# Patient Record
Sex: Male | Born: 1960 | Hispanic: No | Smoking: Former smoker
Health system: Southern US, Community
[De-identification: ages and names within clinical notes are randomized; demographics above are authoritative.]

## PROBLEM LIST (undated history)

## (undated) DIAGNOSIS — K449 Diaphragmatic hernia without obstruction or gangrene: Secondary | ICD-10-CM

## (undated) DIAGNOSIS — M898X5 Other specified disorders of bone, thigh: Secondary | ICD-10-CM

## (undated) DIAGNOSIS — A879 Viral meningitis, unspecified: Secondary | ICD-10-CM

## (undated) DIAGNOSIS — J449 Chronic obstructive pulmonary disease, unspecified: Secondary | ICD-10-CM

## (undated) DIAGNOSIS — K648 Other hemorrhoids: Secondary | ICD-10-CM

## (undated) DIAGNOSIS — R0600 Dyspnea, unspecified: Secondary | ICD-10-CM

## (undated) DIAGNOSIS — H409 Unspecified glaucoma: Secondary | ICD-10-CM

## (undated) DIAGNOSIS — Z8679 Personal history of other diseases of the circulatory system: Secondary | ICD-10-CM

## (undated) DIAGNOSIS — Z8711 Personal history of peptic ulcer disease: Secondary | ICD-10-CM

## (undated) DIAGNOSIS — I119 Hypertensive heart disease without heart failure: Secondary | ICD-10-CM

## (undated) DIAGNOSIS — T7840XA Allergy, unspecified, initial encounter: Secondary | ICD-10-CM

## (undated) DIAGNOSIS — R590 Localized enlarged lymph nodes: Secondary | ICD-10-CM

## (undated) DIAGNOSIS — L739 Follicular disorder, unspecified: Secondary | ICD-10-CM

## (undated) DIAGNOSIS — I10 Essential (primary) hypertension: Secondary | ICD-10-CM

## (undated) DIAGNOSIS — F0781 Postconcussional syndrome: Secondary | ICD-10-CM

## (undated) DIAGNOSIS — R51 Headache: Secondary | ICD-10-CM

## (undated) DIAGNOSIS — M1990 Unspecified osteoarthritis, unspecified site: Secondary | ICD-10-CM

## (undated) DIAGNOSIS — K219 Gastro-esophageal reflux disease without esophagitis: Secondary | ICD-10-CM

## (undated) DIAGNOSIS — D649 Anemia, unspecified: Secondary | ICD-10-CM

## (undated) DIAGNOSIS — R972 Elevated prostate specific antigen [PSA]: Secondary | ICD-10-CM

## (undated) DIAGNOSIS — J40 Bronchitis, not specified as acute or chronic: Secondary | ICD-10-CM

## (undated) DIAGNOSIS — I43 Cardiomyopathy in diseases classified elsewhere: Secondary | ICD-10-CM

## (undated) DIAGNOSIS — J3089 Other allergic rhinitis: Secondary | ICD-10-CM

## (undated) DIAGNOSIS — IMO0002 Reserved for concepts with insufficient information to code with codable children: Secondary | ICD-10-CM

## (undated) DIAGNOSIS — B20 Human immunodeficiency virus [HIV] disease: Secondary | ICD-10-CM

## (undated) DIAGNOSIS — Z87442 Personal history of urinary calculi: Secondary | ICD-10-CM

## (undated) DIAGNOSIS — Z973 Presence of spectacles and contact lenses: Secondary | ICD-10-CM

## (undated) DIAGNOSIS — J189 Pneumonia, unspecified organism: Secondary | ICD-10-CM

## (undated) DIAGNOSIS — Z5189 Encounter for other specified aftercare: Secondary | ICD-10-CM

## (undated) DIAGNOSIS — Z87820 Personal history of traumatic brain injury: Secondary | ICD-10-CM

## (undated) DIAGNOSIS — N189 Chronic kidney disease, unspecified: Secondary | ICD-10-CM

## (undated) DIAGNOSIS — Z8719 Personal history of other diseases of the digestive system: Secondary | ICD-10-CM

## (undated) DIAGNOSIS — J329 Chronic sinusitis, unspecified: Secondary | ICD-10-CM

## (undated) DIAGNOSIS — Z8614 Personal history of Methicillin resistant Staphylococcus aureus infection: Secondary | ICD-10-CM

## (undated) DIAGNOSIS — R310 Gross hematuria: Secondary | ICD-10-CM

## (undated) DIAGNOSIS — C61 Malignant neoplasm of prostate: Secondary | ICD-10-CM

## (undated) HISTORY — DX: Chronic sinusitis, unspecified: J32.9

## (undated) HISTORY — PX: CARDIAC CATHETERIZATION: SHX172

## (undated) HISTORY — PX: COLONOSCOPY: SHX174

## (undated) HISTORY — PX: BREAST SURGERY: SHX581

## (undated) HISTORY — DX: Allergy, unspecified, initial encounter: T78.40XA

## (undated) HISTORY — DX: Diaphragmatic hernia without obstruction or gangrene: K44.9

## (undated) HISTORY — DX: Encounter for other specified aftercare: Z51.89

## (undated) HISTORY — DX: Pneumonia, unspecified organism: J18.9

## (undated) HISTORY — PX: DENTAL EXAMINATION UNDER ANESTHESIA: SHX1447

## (undated) HISTORY — PX: UPPER GASTROINTESTINAL ENDOSCOPY: SHX188

## (undated) HISTORY — DX: Other hemorrhoids: K64.8

## (undated) HISTORY — PX: JOINT REPLACEMENT: SHX530

---

## 1983-07-04 HISTORY — PX: KNEE ARTHROSCOPY: SUR90

## 1986-07-03 DIAGNOSIS — B20 Human immunodeficiency virus [HIV] disease: Secondary | ICD-10-CM

## 1986-07-03 DIAGNOSIS — Z21 Asymptomatic human immunodeficiency virus [HIV] infection status: Secondary | ICD-10-CM

## 1986-07-03 HISTORY — DX: Asymptomatic human immunodeficiency virus (hiv) infection status: Z21

## 1986-07-03 HISTORY — DX: Human immunodeficiency virus (HIV) disease: B20

## 1997-11-27 ENCOUNTER — Emergency Department (HOSPITAL_COMMUNITY): Admission: EM | Admit: 1997-11-27 | Discharge: 1997-11-27 | Payer: Self-pay | Admitting: Emergency Medicine

## 1997-11-28 ENCOUNTER — Emergency Department (HOSPITAL_COMMUNITY): Admission: EM | Admit: 1997-11-28 | Discharge: 1997-11-28 | Payer: Self-pay | Admitting: Emergency Medicine

## 1997-11-30 ENCOUNTER — Emergency Department (HOSPITAL_COMMUNITY): Admission: EM | Admit: 1997-11-30 | Discharge: 1997-11-30 | Payer: Self-pay | Admitting: Emergency Medicine

## 1998-03-08 ENCOUNTER — Emergency Department (HOSPITAL_COMMUNITY): Admission: EM | Admit: 1998-03-08 | Discharge: 1998-03-08 | Payer: Self-pay | Admitting: Emergency Medicine

## 1998-12-06 ENCOUNTER — Emergency Department (HOSPITAL_COMMUNITY): Admission: EM | Admit: 1998-12-06 | Discharge: 1998-12-07 | Payer: Self-pay | Admitting: Emergency Medicine

## 2001-03-15 ENCOUNTER — Encounter: Payer: Self-pay | Admitting: Internal Medicine

## 2001-03-15 ENCOUNTER — Inpatient Hospital Stay (HOSPITAL_COMMUNITY): Admission: EM | Admit: 2001-03-15 | Discharge: 2001-03-22 | Payer: Self-pay | Admitting: Emergency Medicine

## 2001-03-15 ENCOUNTER — Encounter (INDEPENDENT_AMBULATORY_CARE_PROVIDER_SITE_OTHER): Payer: Self-pay | Admitting: Specialist

## 2001-03-16 ENCOUNTER — Encounter: Payer: Self-pay | Admitting: Internal Medicine

## 2001-03-17 ENCOUNTER — Encounter (INDEPENDENT_AMBULATORY_CARE_PROVIDER_SITE_OTHER): Payer: Self-pay | Admitting: *Deleted

## 2001-03-17 ENCOUNTER — Encounter: Payer: Self-pay | Admitting: Internal Medicine

## 2001-03-17 LAB — CONVERTED CEMR LAB
Creatinine, Ser: 1.3 mg/dL
Glucose, Bld: 99 mg/dL
Hemoglobin: 8.7 g/dL
Platelets: 158 10*3/uL
WBC: 5.9 10*3/uL

## 2001-03-18 ENCOUNTER — Encounter: Payer: Self-pay | Admitting: Internal Medicine

## 2001-03-18 ENCOUNTER — Encounter (INDEPENDENT_AMBULATORY_CARE_PROVIDER_SITE_OTHER): Payer: Self-pay | Admitting: *Deleted

## 2001-03-18 LAB — CONVERTED CEMR LAB
HCV Ab: NEGATIVE
Hep B S Ab: NEGATIVE
Hepatitis B Surface Ag: NEGATIVE

## 2001-03-20 ENCOUNTER — Encounter (INDEPENDENT_AMBULATORY_CARE_PROVIDER_SITE_OTHER): Payer: Self-pay | Admitting: *Deleted

## 2001-03-20 LAB — CONVERTED CEMR LAB
CD4 Count: <10 microliters
CD4 T Helper %: 2 %

## 2001-03-22 ENCOUNTER — Encounter (HOSPITAL_COMMUNITY): Admission: RE | Admit: 2001-03-22 | Discharge: 2001-06-20 | Payer: Self-pay | Admitting: Infectious Diseases

## 2001-06-26 ENCOUNTER — Emergency Department (HOSPITAL_COMMUNITY): Admission: EM | Admit: 2001-06-26 | Discharge: 2001-06-26 | Payer: Self-pay | Admitting: Emergency Medicine

## 2001-08-10 ENCOUNTER — Emergency Department (HOSPITAL_COMMUNITY): Admission: EM | Admit: 2001-08-10 | Discharge: 2001-08-10 | Payer: Self-pay | Admitting: Emergency Medicine

## 2006-11-26 ENCOUNTER — Encounter: Payer: Self-pay | Admitting: Internal Medicine

## 2008-03-24 ENCOUNTER — Emergency Department (HOSPITAL_COMMUNITY): Admission: EM | Admit: 2008-03-24 | Discharge: 2008-03-24 | Payer: Self-pay | Admitting: Family Medicine

## 2008-07-03 DIAGNOSIS — Z8614 Personal history of Methicillin resistant Staphylococcus aureus infection: Secondary | ICD-10-CM

## 2008-07-03 HISTORY — DX: Personal history of Methicillin resistant Staphylococcus aureus infection: Z86.14

## 2009-03-22 ENCOUNTER — Ambulatory Visit: Payer: Self-pay | Admitting: Internal Medicine

## 2009-03-22 ENCOUNTER — Encounter (INDEPENDENT_AMBULATORY_CARE_PROVIDER_SITE_OTHER): Payer: Self-pay | Admitting: *Deleted

## 2009-03-22 ENCOUNTER — Inpatient Hospital Stay (HOSPITAL_COMMUNITY): Admission: EM | Admit: 2009-03-22 | Discharge: 2009-03-27 | Payer: Self-pay | Admitting: Emergency Medicine

## 2009-03-22 LAB — CONVERTED CEMR LAB
CD4 Count: <10 microliters
CD4 T Helper %: 1 %
HIV 1 RNA Quant: 57300 copies/mL

## 2009-03-23 ENCOUNTER — Ambulatory Visit: Payer: Self-pay | Admitting: Cardiovascular Disease

## 2009-03-23 ENCOUNTER — Ambulatory Visit: Payer: Self-pay | Admitting: Internal Medicine

## 2009-03-24 ENCOUNTER — Encounter (INDEPENDENT_AMBULATORY_CARE_PROVIDER_SITE_OTHER): Payer: Self-pay | Admitting: General Surgery

## 2009-03-24 HISTORY — PX: OTHER SURGICAL HISTORY: SHX169

## 2009-03-25 ENCOUNTER — Encounter (INDEPENDENT_AMBULATORY_CARE_PROVIDER_SITE_OTHER): Payer: Self-pay | Admitting: *Deleted

## 2009-03-25 LAB — CONVERTED CEMR LAB
Creatinine, Ser: 1.24 mg/dL
Glucose, Bld: 93 mg/dL
Hemoglobin: 8.9 g/dL
Platelets: 160 10*3/uL
WBC: 2.2 10*3/uL

## 2009-04-02 ENCOUNTER — Encounter (INDEPENDENT_AMBULATORY_CARE_PROVIDER_SITE_OTHER): Payer: Self-pay | Admitting: *Deleted

## 2009-04-02 DIAGNOSIS — D649 Anemia, unspecified: Secondary | ICD-10-CM | POA: Insufficient documentation

## 2009-04-02 DIAGNOSIS — D72819 Decreased white blood cell count, unspecified: Secondary | ICD-10-CM | POA: Insufficient documentation

## 2009-04-19 ENCOUNTER — Encounter (INDEPENDENT_AMBULATORY_CARE_PROVIDER_SITE_OTHER): Payer: Self-pay | Admitting: *Deleted

## 2009-04-19 ENCOUNTER — Ambulatory Visit: Payer: Self-pay | Admitting: Internal Medicine

## 2009-04-19 DIAGNOSIS — K6289 Other specified diseases of anus and rectum: Secondary | ICD-10-CM | POA: Insufficient documentation

## 2009-04-19 DIAGNOSIS — B20 Human immunodeficiency virus [HIV] disease: Secondary | ICD-10-CM | POA: Insufficient documentation

## 2009-04-19 DIAGNOSIS — K029 Dental caries, unspecified: Secondary | ICD-10-CM | POA: Insufficient documentation

## 2009-05-03 ENCOUNTER — Ambulatory Visit: Payer: Self-pay | Admitting: Internal Medicine

## 2009-05-05 ENCOUNTER — Encounter (INDEPENDENT_AMBULATORY_CARE_PROVIDER_SITE_OTHER): Payer: Self-pay | Admitting: *Deleted

## 2009-06-14 ENCOUNTER — Ambulatory Visit: Payer: Self-pay | Admitting: Infectious Disease

## 2009-06-14 ENCOUNTER — Encounter: Payer: Self-pay | Admitting: Internal Medicine

## 2009-06-14 DIAGNOSIS — F0630 Mood disorder due to known physiological condition, unspecified: Secondary | ICD-10-CM | POA: Insufficient documentation

## 2009-06-14 LAB — CONVERTED CEMR LAB
ALT: 15 units/L (ref 0–53)
AST: 22 units/L (ref 0–37)
Albumin: 3.6 g/dL (ref 3.5–5.2)
Alkaline Phosphatase: 65 units/L (ref 39–117)
BUN: 9 mg/dL (ref 6–23)
Basophils Absolute: 0 10*3/uL (ref 0.0–0.1)
Basophils Relative: 0 % (ref 0–1)
CO2: 29 meq/L (ref 19–32)
Calcium: 8.8 mg/dL (ref 8.4–10.5)
Chloride: 108 meq/L (ref 96–112)
Creatinine, Ser: 1.32 mg/dL (ref 0.40–1.50)
Eosinophils Absolute: 0.8 10*3/uL — ABNORMAL HIGH (ref 0.0–0.7)
Eosinophils Relative: 31 % — ABNORMAL HIGH (ref 0–5)
Glucose, Bld: 78 mg/dL (ref 70–99)
HCT: 36 % — ABNORMAL LOW (ref 39.0–52.0)
Hemoglobin: 11.9 g/dL — ABNORMAL LOW (ref 13.0–17.0)
Lymphocytes Relative: 32 % (ref 12–46)
Lymphs Abs: 0.9 10*3/uL (ref 0.7–4.0)
MCHC: 33 g/dL (ref 30.0–36.0)
MCV: 96.1 fL (ref >=78.0)
Monocytes Absolute: 0.3 10*3/uL (ref 0.1–1.0)
Monocytes Relative: 11 % (ref 3–12)
Neutro Abs: 0.7 10*3/uL — ABNORMAL LOW (ref 1.7–7.7)
Neutrophils Relative %: 26 % — ABNORMAL LOW (ref 43–77)
Platelets: 136 10*3/uL — ABNORMAL LOW (ref 150–400)
Potassium: 4.3 meq/L (ref 3.5–5.3)
RBC: 3.75 M/uL — ABNORMAL LOW (ref 4.22–5.81)
RDW: 15.9 % — ABNORMAL HIGH (ref 11.5–15.5)
Sodium: 140 meq/L (ref 135–145)
Total Bilirubin: 0.8 mg/dL (ref 0.3–1.2)
Total Protein: 7.5 g/dL (ref 6.0–8.3)
WBC: 2.7 10*3/uL — ABNORMAL LOW (ref 4.0–10.5)

## 2009-06-18 ENCOUNTER — Encounter: Payer: Self-pay | Admitting: Infectious Disease

## 2009-06-18 LAB — CONVERTED CEMR LAB
HIV 1 RNA Quant: 123000 copies/mL — ABNORMAL HIGH (ref <48)
HIV-1 RNA Quant, Log: 5.09 — ABNORMAL HIGH (ref <1.68)

## 2009-12-21 ENCOUNTER — Ambulatory Visit: Payer: Self-pay | Admitting: Internal Medicine

## 2009-12-21 ENCOUNTER — Encounter (INDEPENDENT_AMBULATORY_CARE_PROVIDER_SITE_OTHER): Payer: Self-pay | Admitting: *Deleted

## 2009-12-21 ENCOUNTER — Telehealth (INDEPENDENT_AMBULATORY_CARE_PROVIDER_SITE_OTHER): Payer: Self-pay | Admitting: *Deleted

## 2009-12-21 LAB — CONVERTED CEMR LAB
ALT: 12 units/L (ref 0–53)
AST: 17 units/L (ref 0–37)
Albumin: 3.9 g/dL (ref 3.5–5.2)
Alkaline Phosphatase: 61 units/L (ref 39–117)
BUN: 11 mg/dL (ref 6–23)
Basophils Absolute: 0 10*3/uL (ref 0.0–0.1)
Basophils Relative: 0 % (ref 0–1)
CO2: 27 meq/L (ref 19–32)
Calcium: 9 mg/dL (ref 8.4–10.5)
Chloride: 106 meq/L (ref 96–112)
Cholesterol: 120 mg/dL (ref 0–200)
Creatinine, Ser: 1.13 mg/dL (ref 0.40–1.50)
Eosinophils Absolute: 1 10*3/uL — ABNORMAL HIGH (ref 0.0–0.7)
Eosinophils Relative: 29 % — ABNORMAL HIGH (ref 0–5)
Glucose, Bld: 79 mg/dL (ref 70–99)
HCT: 34 % — ABNORMAL LOW (ref 39.0–52.0)
HDL: 27 mg/dL — ABNORMAL LOW (ref >39)
HIV 1 RNA Quant: 156000 copies/mL — ABNORMAL HIGH (ref <48)
HIV-1 RNA Quant, Log: 5.19 — ABNORMAL HIGH (ref <1.68)
Hemoglobin: 11.2 g/dL — ABNORMAL LOW (ref 13.0–17.0)
LDL Cholesterol: 58 mg/dL (ref 0–99)
Lymphocytes Relative: 27 % (ref 12–46)
Lymphs Abs: 0.9 10*3/uL (ref 0.7–4.0)
MCHC: 32.9 g/dL (ref 30.0–36.0)
MCV: 91.4 fL (ref 78.0–100.0)
Monocytes Absolute: 0.3 10*3/uL (ref 0.1–1.0)
Monocytes Relative: 9 % (ref 3–12)
Neutro Abs: 1.2 10*3/uL — ABNORMAL LOW (ref 1.7–7.7)
Neutrophils Relative %: 35 % — ABNORMAL LOW (ref 43–77)
Platelets: 125 10*3/uL — ABNORMAL LOW (ref 150–400)
Potassium: 4.2 meq/L (ref 3.5–5.3)
RBC: 3.72 M/uL — ABNORMAL LOW (ref 4.22–5.81)
RDW: 14.2 % (ref 11.5–15.5)
Sodium: 142 meq/L (ref 135–145)
Total Bilirubin: 0.6 mg/dL (ref 0.3–1.2)
Total CHOL/HDL Ratio: 4.4
Total Protein: 7.1 g/dL (ref 6.0–8.3)
Triglycerides: 173 mg/dL — ABNORMAL HIGH (ref <150)
VLDL: 35 mg/dL (ref 0–40)
WBC: 3.4 10*3/uL — ABNORMAL LOW (ref 4.0–10.5)

## 2010-02-14 ENCOUNTER — Encounter (INDEPENDENT_AMBULATORY_CARE_PROVIDER_SITE_OTHER): Payer: Self-pay | Admitting: Emergency Medicine

## 2010-02-14 ENCOUNTER — Ambulatory Visit: Payer: Self-pay | Admitting: Surgery

## 2010-02-14 ENCOUNTER — Emergency Department (HOSPITAL_COMMUNITY): Admission: EM | Admit: 2010-02-14 | Discharge: 2010-02-14 | Payer: Self-pay | Admitting: Emergency Medicine

## 2010-07-03 HISTORY — PX: UPPER LEG SOFT TISSUE BIOPSY: SUR152

## 2010-07-12 ENCOUNTER — Telehealth: Payer: Self-pay | Admitting: Internal Medicine

## 2010-07-25 ENCOUNTER — Encounter: Payer: Self-pay | Admitting: Infectious Diseases

## 2010-07-25 ENCOUNTER — Encounter
Admission: RE | Admit: 2010-07-25 | Discharge: 2010-07-25 | Payer: Self-pay | Source: Home / Self Care | Attending: Infectious Diseases | Admitting: Infectious Diseases

## 2010-07-25 ENCOUNTER — Ambulatory Visit
Admission: RE | Admit: 2010-07-25 | Discharge: 2010-07-25 | Payer: Self-pay | Source: Home / Self Care | Attending: Infectious Diseases | Admitting: Infectious Diseases

## 2010-07-25 ENCOUNTER — Encounter: Payer: Self-pay | Admitting: Internal Medicine

## 2010-07-25 LAB — CONVERTED CEMR LAB
ALT: 53 units/L (ref 0–53)
AST: 62 units/L — ABNORMAL HIGH (ref 0–37)
Albumin: 3.6 g/dL (ref 3.5–5.2)
Alkaline Phosphatase: 76 units/L (ref 39–117)
BUN: 10 mg/dL (ref 6–23)
Basophils Absolute: 0 10*3/uL (ref 0.0–0.1)
Basophils Relative: 0 % (ref 0–1)
CO2: 26 meq/L (ref 19–32)
Calcium: 9.1 mg/dL (ref 8.4–10.5)
Chloride: 106 meq/L (ref 96–112)
Cholesterol: 138 mg/dL (ref 0–200)
Creatinine, Ser: 1.07 mg/dL (ref 0.40–1.50)
Eosinophils Absolute: 0.9 10*3/uL — ABNORMAL HIGH (ref 0.0–0.7)
Eosinophils Relative: 41 % — ABNORMAL HIGH (ref 0–5)
Glucose, Bld: 94 mg/dL (ref 70–99)
HCT: 32.9 % — ABNORMAL LOW (ref 39.0–52.0)
HDL: 31 mg/dL — ABNORMAL LOW (ref >39)
HIV 1 RNA Quant: 1480000 copies/mL — ABNORMAL HIGH (ref <20)
HIV-1 RNA Quant, Log: 6.17 — ABNORMAL HIGH (ref <1.30)
Hemoglobin: 10.6 g/dL — ABNORMAL LOW (ref 13.0–17.0)
Hep A Total Ab: NEGATIVE
LDL Cholesterol: 56 mg/dL (ref 0–99)
Lymphocytes Relative: 19 % (ref 12–46)
Lymphs Abs: 0.4 10*3/uL — ABNORMAL LOW (ref 0.7–4.0)
MCHC: 32.2 g/dL (ref 30.0–36.0)
MCV: 87 fL (ref 78.0–100.0)
Monocytes Absolute: 0.2 10*3/uL (ref 0.1–1.0)
Monocytes Relative: 8 % (ref 3–12)
Neutro Abs: 0.7 10*3/uL — ABNORMAL LOW (ref 1.7–7.7)
Neutrophils Relative %: 32 % — ABNORMAL LOW (ref 43–77)
Platelets: 180 10*3/uL (ref 150–400)
Potassium: 4.7 meq/L (ref 3.5–5.3)
RBC: 3.78 M/uL — ABNORMAL LOW (ref 4.22–5.81)
RDW: 15.8 % — ABNORMAL HIGH (ref 11.5–15.5)
Sodium: 141 meq/L (ref 135–145)
Total Bilirubin: 0.4 mg/dL (ref 0.3–1.2)
Total CHOL/HDL Ratio: 4.5
Total Protein: 7.1 g/dL (ref 6.0–8.3)
Triglycerides: 253 mg/dL — ABNORMAL HIGH (ref <150)
VLDL: 51 mg/dL — ABNORMAL HIGH (ref 0–40)
WBC: 2.3 10*3/uL — ABNORMAL LOW (ref 4.0–10.5)

## 2010-07-26 ENCOUNTER — Encounter: Payer: Self-pay | Admitting: Internal Medicine

## 2010-07-27 LAB — T-HELPER CELL (CD4) - (RCID CLINIC ONLY)
CD4 % Helper T Cell: <1 % — ABNORMAL LOW (ref 33–55)
CD4 T Cell Abs: <10 uL — ABNORMAL LOW (ref 400–2700)

## 2010-07-31 LAB — CONVERTED CEMR LAB
ALT: 11 units/L (ref 0–53)
AST: 18 units/L (ref 0–37)
Albumin: 3.9 g/dL (ref 3.5–5.2)
Alkaline Phosphatase: 70 units/L (ref 39–117)
BUN: 8 mg/dL (ref 6–23)
Basophils Absolute: 0 10*3/uL (ref 0.0–0.1)
Basophils Relative: 1 % (ref 0–1)
CO2: 24 meq/L (ref 19–32)
Calcium: 8.7 mg/dL (ref 8.4–10.5)
Chlamydia, Swab/Urine, PCR: NEGATIVE
Chloride: 106 meq/L (ref 96–112)
Cholesterol: 134 mg/dL (ref 0–200)
Creatinine, Ser: 1.15 mg/dL (ref 0.40–1.50)
Eosinophils Absolute: 0.6 10*3/uL (ref 0.0–0.7)
Eosinophils Relative: 14 % — ABNORMAL HIGH (ref 0–5)
GC Probe Amp, Urine: NEGATIVE
Glucose, Bld: 83 mg/dL (ref 70–99)
HCT: 31.8 % — ABNORMAL LOW (ref 39.0–52.0)
HCV Ab: NEGATIVE
HDL: 32 mg/dL — ABNORMAL LOW (ref >39)
Hemoglobin: 10.1 g/dL — ABNORMAL LOW (ref 13.0–17.0)
Hep B S Ab: POSITIVE — AB
Hepatitis B Surface Ag: NEGATIVE
LDL Cholesterol: 74 mg/dL (ref 0–99)
Lymphocytes Relative: 30 % (ref 12–46)
Lymphs Abs: 1.2 10*3/uL (ref 0.7–4.0)
MCHC: 31.8 g/dL (ref 30.0–36.0)
MCV: 94.1 fL (ref >=78.0)
Monocytes Absolute: 0.4 10*3/uL (ref 0.1–1.0)
Monocytes Relative: 9 % (ref 3–12)
Neutro Abs: 2 10*3/uL (ref 1.7–7.7)
Neutrophils Relative %: 47 % (ref 43–77)
Platelets: 140 10*3/uL — ABNORMAL LOW (ref 150–400)
Potassium: 4 meq/L (ref 3.5–5.3)
RBC: 3.38 M/uL — ABNORMAL LOW (ref 4.22–5.81)
RDW: 14.9 % (ref 11.5–15.5)
Sodium: 142 meq/L (ref 135–145)
Total Bilirubin: 1.4 mg/dL — ABNORMAL HIGH (ref 0.3–1.2)
Total CHOL/HDL Ratio: 4.2
Total Protein: 7.2 g/dL (ref 6.0–8.3)
Triglycerides: 139 mg/dL (ref <150)
VLDL: 28 mg/dL (ref 0–40)
WBC: 4.2 10*3/uL (ref 4.0–10.5)

## 2010-08-04 NOTE — Progress Notes (Signed)
Summary: Pt to bring in information  spoke to patient who does not have his insurance cared (pharmacy coverage) for me to assist him today.  He left his card in his truck in [place]  He said he will take what was prescribed for him today and come in to be seen in the next 3 weeks for me to assist him.  Paulo Fruit  BS,CPht II,MPH  December 21, 2009 12:42 PM

## 2010-08-04 NOTE — Miscellaneous (Signed)
Summary: pneumovax - 03/22/09  Clinical Lists Changes  Observations: Added new observation of PNEUMOVAX: Historical (03/22/2009 10:02)      Pneumovax Immunization History:    Pneumovax # 1:  Historical (03/22/2009)
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
## 2010-08-04 NOTE — Consult Note (Signed)
Summary: [facility]   Imported By: Roderic Ovens 04/13/2009 15:29:02  _____________________________________________________________________  External Attachment:    Type:   Image     Comment:   External Document
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
## 2010-08-04 NOTE — Miscellaneous (Signed)
Summary: Updated RW flowsheet  Clinical Lists Changes  Observations: Added new observation of RWPARTICIP: Yes (12/21/2009 13:53) Added new observation of PAYOR: Private (12/21/2009 13:53) Added new observation of REC_MESSAGE: Yes (12/21/2009 13:53) Added new observation of RECPHONECALL: Yes (12/21/2009 13:53) Added new observation of REC_MAIL: Yes (12/21/2009 13:53) Added new observation of INFECTDIS MD: Orvan Falconer (12/21/2009 13:53) Added new observation of GENDER: Male (12/21/2009 13:53) Added new observation of LATINO/HISP: No (12/21/2009 13:53) Added new observation of RACE: African American (12/21/2009 13:53) Added new observation of PATNTCOUNTY: [place] (12/21/2009 13:53) Added new observation of YEARAIDSPOS: 2002  (12/21/2009 13:53) Added new observation of CREATININE: 1.24 mg/dL (16/04/9603 54:09) Added new observation of PLATELETK/UL: 160 K/uL (03/25/2009 13:57) Added new observation of WBC COUNT: 2.2 10*3/microliter (03/25/2009 13:57) Added new observation of HGB: 8.9 g/dL (81/19/1478 29:56) Added new observation of GLUCOSE SER: 93 mg/dL (21/30/8657 84:69) Added new observation of HIV1RNA QA: 57300 copies/mL (03/22/2009 13:55) Added new observation of T-HELPER %: 1 % (03/22/2009 13:55) Added new observation of CD4 COUNT: <10 microliters (03/22/2009 13:55) Added new observation of T-HELPER %: 2 % (03/20/2001 14:00) Added new observation of CD4 COUNT: <10 microliters (03/20/2001 14:00) Added new observation of HEP C AB: NEG  (03/18/2001 14:01) Added new observation of ANTI-HBS: NEG  (03/18/2001 14:01) Added new observation of HBSAG: NEG  (03/18/2001 14:01) Added new observation of CREATININE: 1.3 mg/dL (62/95/2841 32:44) Added new observation of PLATELETK/UL: 158 K/uL (03/17/2001 14:02) Added new observation of WBC COUNT: 5.9 10*3/microliter (03/17/2001 14:02) Added new observation of HGB: 8.7 g/dL (07/05/7251 66:44) Added new observation of GLUCOSE SER: 99 mg/dL (03/47/4259  56:38)      -  Date:  03/25/2009    Glucose: 93  Date:  03/17/2001    Glucose: 99

## 2010-08-04 NOTE — Assessment & Plan Note (Signed)
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  History of Present Illness: Casey Brennan is in for his first visit since January.  After that visit with Dr. Daiva Eves he was able to get a brief supply of his medications and started taking Truvada, Prezista, Norvir, Zithromax, Bactrim, and Diflucan.  He said that his monthly co-pay was $900 and that he has been told and other clinics that he makes too much money to qualify for any medication assistance.  He has been out of all medications for several months.  He says that he did tolerate the medications well while he was on them.  Recently he has continued to have problems with intermittently draining left breast abscess.  He says he has a week of vacation coming up and wants to schedule a follow-up appointment with Dr. Claud Kelp who did an incision and drainage procedure on him when he was hospitalized a year and a half ago.  He is also been bothered by recurrent small boils and pruritic skin rash.  He is also hoping to have his remaining front teeth pulled.  Preventive Screening-Counseling & Management  Alcohol-Tobacco     Alcohol drinks/day: 0     Smoking Status: current     Cigars/week: 3 packs of cigars/wk  Caffeine-Diet-Exercise     Caffeine use/day: yes     Does Patient Exercise: yes     Type of exercise: weight lifting     Exercise (avg: min/session): <30     Times/week: <3  Hep-HIV-STD-Contraception     HIV Risk: no risk noted  Safety-Violence-Falls     Seat Belt Use: yes  Comments: declined condoms      Sexual History:  n/a.        Drug Use:  never.     Current Allergies (reviewed today): ! PCN ! 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 No Pain Assessment Patient in pain? no      Nutritional Status BMI of 25 - 29 = overweight Nutritional Status Detail appetite "pretty decent"  Have you ever been in a relationship where you felt threatened, hurt or afraid?not asked today   Does patient need assistance? Functional Status Self care Ambulation Normal Comments been off rxes since Feb 2011.  Pt. unable to afford the rxes.   Physical Exam  General:  alert and well-nourished.   Mouth:  poor dentition and teeth missing.  his only remaining teeth are in very poor condition. Breasts:  is left breast is indurated around the nipple and the nipple is quite enlarged with a pinhole tract that intermittently drains purulent fluid.  It is nontender and there is no cellulitis. Lungs:  normal respiratory effort, normal breath sounds, no crackles, and no wheezes.   Heart:  normal rate, regular rhythm, and no murmur.   Skin:  he has diffuse excoriated, hyperpigmented follicular-based papules.  He has some scars in each axilla and one small boil  on the right. Psych:  normally interactive, good eye contact, not anxious appearing, and not depressed appearing.          Medication Adherence: 12/21/2009   Adherence to medications reviewed with patient. Counseling to provide adequate adherence provided                                Impression & Recommendations:  Problem # 1:  HIV INFECTION (ICD-042) His HIV infection is far advanced and currently untreated.  I will get him restarted on his prophylactic medications today. He can get those very extensively at [facility].  I will have him meet with [employer] see if there is any financial assistance available for his HIV medications.  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 His updated medication list for this problem includes:    Fluconazole 100 Mg Tabs (Fluconazole) .[name]... Take one tablet by mouth weekly    Bactrim Ds 800-160 Mg Tabs (Sulfamethoxazole-trimethoprim) .[name]... Take 1 tablet by mouth once a day    Azithromycin 600 Mg Tabs (Azithromycin) .[name]... Take 2 tablets by mouth once a week    Doxycycline Hyclate 100 Mg Caps (Doxycycline hyclate) .[name]... Take 1 capsule by mouth two times a day  Diagnostics Reviewed:  HIV: CDC-defined AIDS (04/19/2009)   CD4: <10 cmm (06/15/2009)   WBC: 2.7 (06/14/2009)   Hgb: 11.9 (06/14/2009)   HCT: 36.0 (06/14/2009)   Platelets: 136 (06/14/2009) HIV genotype: * (06/18/2009)   HIV-1 RNA: 123000 (06/14/2009)   HBSAg: NEG (04/19/2009)  Orders: T-CD4SP ([facility]) (CD4SP) T-HIV Viral Load (16109-60454) T-Comprehensive Metabolic Panel [phone]) T-Lipid Profile (29562-13086) T-CBC w/Diff (57846-96295) T-RPR (Syphilis) (28413-24401) Est. 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  I will put him on empiric doxycycline to see if that helps Orders: Est. Patient Level IV (36644)  Problem # 3:  ABSCESS, BREAST, LEFT, CHRONIC (ICD-611.0) I suspect he will need further surgery to cure is chronic left breast abscess.  He plans to call and make an appointment with Dr. Derrell Lolling in the coming weeks. Orders: Est. Patient Level IV (03474) Surgical Referral (Surgery)  Problem # 4:  DENTAL CARIES (ICD-521.00) I will make a referral to oral surgery for dental extractions. Orders: Est. Patient Level IV (25956) Dental Referral (Dentist)  Medications Added to Medication List This Visit: 1)  Doxycycline Hyclate 100 Mg Caps (Doxycycline hyclate) .... Take 1 capsule by mouth two times a day  Patient Instructions: 1)  Please schedule a follow-up appointment in 1 month.  Prescriptions: TRUVADA 200-300 MG TABS (EMTRICITABINE-TENOFOVIR) Take 1  tablet by mouth once a day  #90 x 3   Entered and Authorized by:   Cliffton Asters MD   Signed by:   Cliffton Asters MD on 12/21/2009   Method used:   Print then Give to Patient   RxID:   [id number] NORVIR 100 MG TABS (RITONAVIR) Take 1 tablet by mouth once a day with two prezistas and one truvada  #90 x 3   Entered and Authorized by:   Cliffton Asters MD   Signed by:   Cliffton Asters MD on 12/21/2009   Method used:   Print then Give to Patient   RxID:   [id number] PREZISTA 400 MG TABS (DARUNAVIR ETHANOLATE) take two tablets once with one norvir and  one truvada  #180 x 3   Entered and Authorized by:   Cliffton Asters MD   Signed by:   Cliffton Asters MD on 12/21/2009   Method used:   Print then Give to Patient   RxID:   [id number] DOXYCYCLINE HYCLATE 100 MG CAPS (DOXYCYCLINE HYCLATE) Take 1 capsule by mouth two times a day  #60 x 1   Entered and Authorized by:   Cliffton Asters MD   Signed by:   Cliffton Asters MD on 12/21/2009   Method used:   Print then Give to Patient   RxID:   [id number] AZITHROMYCIN 600 MG TABS (AZITHROMYCIN) Take 2 tablets by mouth once a week  #30 x 3333   Entered and Authorized by:   Cliffton Asters MD   Signed by:   Cliffton Asters MD on 12/21/2009   Method used:   Print then Give to Patient   RxID:   [id number] BACTRIM DS 800-160 MG TABS (SULFAMETHOXAZOLE-TRIMETHOPRIM) Take 1 tablet by mouth once a day  #90 x 3   Entered and Authorized by:   Cliffton Asters MD   Signed by:   Cliffton Asters MD on 12/21/2009   Method used:   Print then Give to Patient   RxID:   [id number] FLUCONAZOLE 100 MG TABS (FLUCONAZOLE) Take one tablet by mouth weekly  #15 x 3   Entered and Authorized by:   Cliffton Asters MD   Signed by:   Cliffton Asters MD on 12/21/2009   Method used:   Print then Give to Patient   RxID:   [id number]

## 2010-08-04 NOTE — Miscellaneous (Signed)
Summary: HIPAA Restrictions  HIPAA Restrictions   Imported By: Florinda Marker 04/19/2009 16:36:55  _____________________________________________________________________  External Attachment:    Type:   Image     Comment:   External Document

## 2010-08-04 NOTE — Miscellaneous (Signed)
Summary: Dr. Kirstie Peri  Dr. Kirstie Peri   Imported By: Florinda Marker 04/21/2009 12:04:07  _____________________________________________________________________  External Attachment:    Type:   Image     Comment:   External Document

## 2010-08-04 NOTE — Miscellaneous (Signed)
Summary: PPD negative  Clinical Lists Changes  Observations: Added new observation of TB PPDRESULT: negative (05/05/2009 12:26) Added new observation of PPD RESULT: < 5mm (05/05/2009 12:26) Added new observation of TB-PPD RDDTE: 05/05/2009 (05/05/2009 12:26)      PPD Results    Date of reading: 05/05/2009    Results: < 5mm    Interpretation: negative

## 2010-08-04 NOTE — Assessment & Plan Note (Signed)
Summary: F/U APPT/VS   CC:  follow-up visit.  History of Present Illness: Casey Brennan is in for his routine visit.  He states he's doing well and has not had any problems since his last visit.  He does not know the names of his medications but can describe taking them all correctly.  He remains on Septra, Diflucan, and Zithromax.  He is not had any difficulty obtaining the medications or tolerating them and does not believe these missed a single dose.  Preventive Screening-Counseling & Management  Alcohol-Tobacco     Alcohol drinks/day: 0     Smoking Status: current     Cigars/week: 1/wk  Caffeine-Diet-Exercise     Caffeine use/day: yes     Does Patient Exercise: yes     Type of exercise: weight lifting     Exercise (avg: min/session): <30     Times/week: <3  Hep-HIV-STD-Contraception     HIV Risk: no risk noted  Safety-Violence-Falls     Seat Belt Use: yes      Sexual History:  n/a.        Drug Use:  never.     Current Allergies (reviewed today): ! PCN ! SULFA Vital Signs:  Patient profile:   50 year old male Height:      69 inches (175.26 cm) Weight:      187.3 pounds (85.14 kg) BMI:     27.76 Temp:     98.0 degrees F (36.67 degrees C) oral Pulse rate:   93 / minute BP sitting:   114 / 72  (left arm) Cuff size:   large  Vitals Entered By: Jennet Maduro RN (May 03, 2009 3:14 PM) CC: follow-up visit Is Patient Diabetic? No Pain Assessment Patient in pain? no      Nutritional Status BMI of 25 - 29 = overweight Nutritional Status Detail appetite "pretty good"  Have you ever been in a relationship where you felt threatened, hurt or afraid?No   Does patient need assistance? Functional Status Self care Ambulation Normal Comments no missed doses of rxes   Physical Exam  General:  alert and well-nourished.   Mouth:  pharynx pink and moist, no erythema, no exudates, poor dentition, and teeth missing.   Lungs:  normal breath sounds, no crackles, and no  wheezes.   Heart:  normal rate, regular rhythm, no murmur, no gallop, and no rub.      Impression & Recommendations:  Problem # 1:  HIV INFECTION (ICD-042) I still have not received records from [facility] where he was receiving care for his HIV for approximately the last 3 to 4 years from Dr. Kirstie Peri.  We will send the release of information request again and I also asked Casey Brennan to contact the clinic directly and ask for his records to be sent to [place].  When he was recently hospitalized his CD4 count was less than 10 and his viral load was 57,000 so he definitely needs to restart a salvage regimen.  I will continue his current prophylactic medications.  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 Therefore, I will schedule follow-up with one of my partners who has a regular Monday morning clinic. His updated medication list for this problem includes:    Fluconazole 100 Mg Tabs (Fluconazole) .[name]... Take one tablet by mouth weekly    Bactrim Ds 800-160 Mg Tabs (Sulfamethoxazole-trimethoprim) .Marland KitchenMarland KitchenMarland KitchenMarland Kitchen  Take 1 tablet by mouth once a day    Azithromycin 600 Mg Tabs (Azithromycin) .[name]... Take 2 tablets by mouth once a week  Orders: Est. Patient Level III (16109)  Patient Instructions: 1)  Please schedule a follow-up appointment in 4-6 weeks. 2)  Please scheduled his follow-up visit with Dr.Kees Daiva Eves.  Appended Document: F/U APPT/VS   PPD Application    Vaccine Type: PPD    Site: left forearm    Mfr: Sanofi Pasteur    Dose: 0.1 ml    Route: ID    Given by: Jennet Maduro RN    Exp. Date: 07/31/2011    Lot #: [id number] Pt. instructed to call the clinic with results on Wed., Nov. 3, 2010.  Jennet Maduro RN  May 03, 2009 3:42 PM

## 2010-08-04 NOTE — Consult Note (Signed)
Summary: [facility]   Imported By: Florinda Marker 07/15/2009 14:35:24  _____________________________________________________________________  External Attachment:    Type:   Image     Comment:   External Document

## 2010-08-04 NOTE — Miscellaneous (Signed)
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) Medications: Added new medication of CLINDAMYCIN HCL 300 MG CAPS (CLINDAMYCIN HCL) Take 1 capsule by mouth four times a day x 10 days Added new medication of FLUCONAZOLE 100 MG TABS (FLUCONAZOLE) Take one tablet by mouth weekly Added new medication of BACTRIM DS 800-160 MG TABS (SULFAMETHOXAZOLE-TRIMETHOPRIM) Take 1 tablet by mouth once a day Added new medication of AZITHROMYCIN 600 MG TABS (AZITHROMYCIN) Take 2 tablets by mouth once a week Added new medication of ASPIRIN 325 MG TABS (ASPIRIN) Take 1 tablet by mouth once a day Added new medication of COLCHICINE 0.6 MG TABS (COLCHICINE) Take 1 tablet by mouth two times a day for 10 days. Added new medication of FOLIC ACID 1 MG TABS (FOLIC ACID) Take 1 tablet by mouth once a day Allergies: Added new allergy or adverse reaction of PCN Observations: Added new observation of NKA: F (04/02/2009 14:30)

## 2010-08-04 NOTE — Assessment & Plan Note (Addendum)
Summary: pt of Casey Brennan/pt hospitalzied in [place]/fever106/pna/kam   CC:  hospital followup for pneumonia and pt. never started HAART meds could not afford.  History of Present Illness: 50 yo M with hx of AIDs, non-adherence due to finances and L breast draining lesion ( I and D 06-2009).  Was in hospital in [place] with PNA and anemia. He was d/c from hospital 07-17-10. Since d/c was on Augmentin, Azithro (1 tab q wed and SMP/TMX (1 two times a day ).  Since d/c has been feeling a little sluggish. Fever has resolved. Breathing is good- no DOE.   Preventive Screening-Counseling & Management  Alcohol-Tobacco     Alcohol drinks/day: 0     Smoking Status: quit < 6 months     Cigars/week: 3 packs of cigars/wk  Caffeine-Diet-Exercise     Caffeine use/day: yes     Does Patient Exercise: yes     Type of exercise: weight lifting     Exercise (avg: min/session): <30     Times/week: <3  Hep-HIV-STD-Contraception     HIV Risk: no risk noted  Safety-Violence-Falls     Seat Belt Use: yes      Sexual History:  n/a.        Drug Use:  never.    Comments: pt. declined condoms   Updated Prior Medication List: BACTRIM DS 800-160 MG TABS (SULFAMETHOXAZOLE-TRIMETHOPRIM) Take 1 tablet by mouth once a day AZITHROMYCIN 600 MG TABS (AZITHROMYCIN) Take 2 tablets by mouth once a week PREZISTA 400 MG TABS (DARUNAVIR ETHANOLATE) take two tablets once with one norvir and one truvada NORVIR 100 MG TABS (RITONAVIR) Take 1 tablet by mouth once a day with two prezistas and one truvada TRUVADA 200-300 MG TABS (EMTRICITABINE-TENOFOVIR) Take 1 tablet by mouth once a day AUGMENTIN 500-125 MG TABS (AMOXICILLIN-POT CLAVULANATE) Take 1 tablet by mouth once a day  Current Allergies (reviewed today): ! PCN ! SULFA Past History:  Past Medical History: AIDS     Genotype K103N 06-2009  Review of Systems       lost 8#, moving bowels well, nl urination. no thrush or oral ulcers.   Vital Signs:  Patient  profile:   50 year old male Height:      69 inches (175.26 cm) Weight:      172.8 pounds (78.55 kg) BMI:     25.61 Temp:     98.6 degrees F (37.00 degrees C) oral Pulse rate:   97 / minute BP sitting:   113 / 74  (right arm)  Vitals Entered By: Wendall Mola CMA Duncan Dull) (July 25, 2010 3:24 PM) CC: hospital followup for pneumonia, pt. never started HAART meds could not afford Is Patient Diabetic? No Pain Assessment Patient in pain? no      Nutritional Status BMI of 25 - 29 = overweight Nutritional Status Detail appetite "good"  Have you ever been in a relationship where you felt threatened, hurt or afraid?Unable to ask   Does patient need assistance? Functional Status Self care Ambulation Normal   Physical Exam  General:  well-developed, well-nourished, and well-hydrated.   Eyes:  pupils equal, pupils round, and pupils reactive to light.   Mouth:  pharynx pink and moist and no exudates.   Neck:  no masses.   Lungs:  normal respiratory effort and L diffuse crackles.   Heart:  normal rate, regular rhythm, and no murmur.   Abdomen:  soft, non-tender, and normal bowel sounds.     Impression & Recommendations:  Problem #  1:  HIV INFECTION (ICD-042) will check his baseline labs. he would be a good candidate for complera but hs VL is too high. he will get flu shot today. will check his Hep A serology to see if he needs vax. will have him seen by medicatin assistance person. will have him back in 2 months.   The following medications were removed from the medication list:    Fluconazole 100 Mg Tabs (Fluconazole) .[name]... Take one tablet by mouth weekly    Doxycycline Hyclate 100 Mg Caps (Doxycycline hyclate) .[name]... Take 1 capsule by mouth two times a day His updated medication list for this problem includes:    Bactrim Ds 800-160 Mg Tabs (Sulfamethoxazole-trimethoprim) .[name]... Take 1 tablet by mouth once a day    Azithromycin 600 Mg Tabs (Azithromycin) .[name]... Take 2 tablets by  mouth once a week    Augmentin 500-125 Mg Tabs (Amoxicillin-pot clavulanate) .[name]... Take 1 tablet by mouth once a day  Orders: Est. Patient Level IV (16109) T-CD4SP [facility]) (CD4SP) T-HIV Viral Load [phone]) T-Comprehensive Metabolic Panel [phone]) T-CBC w/Diff (13086-57846) T-Hepatitis A Antibody (96295-28413) CXR- 2view (CXR)Future Orders: T-RPR (Syphilis) (24401-02725) ... 10/23/2010 T-Lipid Profile [id number]) ... 10/23/2010  Problem # 2:  PNEUMONIA (ICD-486)  will check a f/u CXR to assess his therapy.  The following medications were removed from the medication list:    Doxycycline Hyclate 100 Mg Caps (Doxycycline hyclate) .[name]... Take 1 capsule by mouth two times a day His updated medication list for this problem includes:    Bactrim Ds 800-160 Mg Tabs (Sulfamethoxazole-trimethoprim) .[name]... Take 1 tablet by mouth once a day    Azithromycin 600 Mg Tabs (Azithromycin) .[name]... Take 2 tablets by mouth once a week    Augmentin 500-125 Mg Tabs (Amoxicillin-pot clavulanate) .[name]... Take 1 tablet by mouth once a day  Orders: CXR- 2view (CXR)  Medications Added to Medication List This Visit: 1)  Augmentin 500-125 Mg Tabs (Amoxicillin-pot clavulanate) .... Take 1 tablet by mouth once a day         Medication Adherence: 07/25/2010   Adherence to medications reviewed with patient. Counseling to provide adequate adherence provided   Prevention For Positives: 07/25/2010   Safe sex practices discussed with patient. Condoms offered.                             Appended Document: pt of Casey Brennan/pt hospitalzied in [place]/fever106/pna/kam    Clinical Lists Changes  Orders: Added new Service order of Influenza Vaccine NON MCR (25956) - Signed Observations: Added new observation of FLU VAX#1VIS: 01/25/10 version given July 25, 2010. (07/25/2010 16:18) Added new observation of FLU VAXLOT: [id number] (07/25/2010 16:18) Added new observation of FLU VAX EXP: 12/31/2010  (07/25/2010 16:18) Added new observation of FLU VAXBY: Wendall Mola CMA ( AAMA) (07/25/2010 16:18) Added new observation of FLU VAXRTE: IM (07/25/2010 16:18) Added new observation of FLU VAX DSE: 0.5 ml (07/25/2010 16:18) Added new observation of FLU VAXMFR: GlaxoSmithKline (07/25/2010 16:18) Added new observation of FLU VAX SITE: right deltoid (07/25/2010 16:18) Added new observation of FLU VAX: Fluvax Non-MCR (07/25/2010 16:18)       Immunizations Administered:  Influenza Vaccine # 1:    Vaccine Type: Fluvax Non-MCR    Site: right deltoid    Mfr: GlaxoSmithKline    Dose: 0.5 ml    Route: IM    Given by: Wendall Mola CMA ( AAMA)    Exp. Date: 12/31/2010    Lot #:  [id number]    VIS given: 01/25/10 version given July 25, 2010.  Flu Vaccine Consent Questions:    Do you have a history of severe allergic reactions to this vaccine? no    Any prior history of allergic reactions to egg and/or gelatin? no    Do you have a sensitivity to the preservative Thimersol? no    Do you have a past history of Guillan-Barre Syndrome? no    Do you currently have an acute febrile illness? no    Have you ever had a severe reaction to latex? no    Vaccine information given and explained to patient? yes

## 2010-08-04 NOTE — Consult Note (Signed)
Summary: [facility]   Imported By: Florinda Marker 08/03/2009 15:05:42  _____________________________________________________________________  External Attachment:    Type:   Image     Comment:   External Document

## 2010-08-04 NOTE — Consult Note (Signed)
[facility]   Imported By: Florinda Marker 07/13/2009 14:09:33  _____________________________________________________________________  External Attachment:    Type:   Image     Comment:   External Document

## 2010-08-10 ENCOUNTER — Encounter: Payer: Self-pay | Admitting: Internal Medicine

## 2010-08-19 ENCOUNTER — Ambulatory Visit (INDEPENDENT_AMBULATORY_CARE_PROVIDER_SITE_OTHER): Payer: BC Managed Care – PPO | Admitting: Adult Health

## 2010-08-19 ENCOUNTER — Encounter: Payer: Self-pay | Admitting: Adult Health

## 2010-08-19 DIAGNOSIS — B353 Tinea pedis: Secondary | ICD-10-CM

## 2010-08-19 DIAGNOSIS — B20 Human immunodeficiency virus [HIV] disease: Secondary | ICD-10-CM

## 2010-08-19 DIAGNOSIS — N61 Mastitis without abscess: Secondary | ICD-10-CM

## 2010-08-19 DIAGNOSIS — Z91199 Patient's noncompliance with other medical treatment and regimen due to unspecified reason: Secondary | ICD-10-CM

## 2010-08-19 DIAGNOSIS — Z9119 Patient's noncompliance with other medical treatment and regimen: Secondary | ICD-10-CM

## 2010-08-24 ENCOUNTER — Telehealth: Payer: Self-pay | Admitting: Adult Health

## 2010-08-24 ENCOUNTER — Inpatient Hospital Stay (HOSPITAL_COMMUNITY)
Admission: EM | Admit: 2010-08-24 | Discharge: 2010-08-29 | DRG: 532 | Disposition: A | Discharge status: Home / Self Care | Payer: BC Managed Care – PPO | Attending: Internal Medicine | Admitting: Internal Medicine

## 2010-08-24 ENCOUNTER — Emergency Department (HOSPITAL_COMMUNITY): Payer: BC Managed Care – PPO

## 2010-08-24 ENCOUNTER — Encounter: Payer: Self-pay | Admitting: Adult Health

## 2010-08-24 ENCOUNTER — Encounter (HOSPITAL_COMMUNITY): Payer: Self-pay | Admitting: Radiology

## 2010-08-24 ENCOUNTER — Ambulatory Visit: Payer: BC Managed Care – PPO | Admitting: Adult Health

## 2010-08-24 ENCOUNTER — Encounter: Payer: Self-pay | Admitting: Internal Medicine

## 2010-08-24 DIAGNOSIS — Z91199 Patient's noncompliance with other medical treatment and regimen due to unspecified reason: Secondary | ICD-10-CM

## 2010-08-24 DIAGNOSIS — R519 Headache, unspecified: Secondary | ICD-10-CM | POA: Insufficient documentation

## 2010-08-24 DIAGNOSIS — J323 Chronic sphenoidal sinusitis: Secondary | ICD-10-CM | POA: Diagnosis present

## 2010-08-24 DIAGNOSIS — B20 Human immunodeficiency virus [HIV] disease: Secondary | ICD-10-CM | POA: Diagnosis present

## 2010-08-24 DIAGNOSIS — Z8614 Personal history of Methicillin resistant Staphylococcus aureus infection: Secondary | ICD-10-CM

## 2010-08-24 DIAGNOSIS — N6452 Nipple discharge: Secondary | ICD-10-CM

## 2010-08-24 DIAGNOSIS — Z79899 Other long term (current) drug therapy: Secondary | ICD-10-CM

## 2010-08-24 DIAGNOSIS — G44209 Tension-type headache, unspecified, not intractable: Secondary | ICD-10-CM | POA: Diagnosis present

## 2010-08-24 DIAGNOSIS — F0781 Postconcussional syndrome: Principal | ICD-10-CM | POA: Diagnosis present

## 2010-08-24 DIAGNOSIS — Z9119 Patient's noncompliance with other medical treatment and regimen: Secondary | ICD-10-CM

## 2010-08-24 DIAGNOSIS — B353 Tinea pedis: Secondary | ICD-10-CM | POA: Diagnosis present

## 2010-08-24 DIAGNOSIS — G44309 Post-traumatic headache, unspecified, not intractable: Secondary | ICD-10-CM | POA: Diagnosis present

## 2010-08-24 DIAGNOSIS — E538 Deficiency of other specified B group vitamins: Secondary | ICD-10-CM | POA: Diagnosis present

## 2010-08-24 DIAGNOSIS — N62 Hypertrophy of breast: Secondary | ICD-10-CM | POA: Diagnosis present

## 2010-08-24 DIAGNOSIS — IMO0001 Reserved for inherently not codable concepts without codable children: Secondary | ICD-10-CM

## 2010-08-24 DIAGNOSIS — N6009 Solitary cyst of unspecified breast: Secondary | ICD-10-CM | POA: Diagnosis present

## 2010-08-24 DIAGNOSIS — R51 Headache: Secondary | ICD-10-CM

## 2010-08-24 HISTORY — DX: Essential (primary) hypertension: I10

## 2010-08-24 HISTORY — DX: Human immunodeficiency virus (HIV) disease: B20

## 2010-08-24 LAB — DIFFERENTIAL
Basophils Absolute: 0 10*3/uL (ref 0.0–0.1)
Basophils Relative: 0 % (ref 0–1)
Eosinophils Absolute: 4.4 10*3/uL — ABNORMAL HIGH (ref 0.0–0.7)
Eosinophils Relative: 67 % — ABNORMAL HIGH (ref 0–5)
Lymphocytes Relative: 15 % (ref 12–46)
Lymphs Abs: 1 10*3/uL (ref 0.7–4.0)
Monocytes Absolute: 0.5 10*3/uL (ref 0.1–1.0)
Monocytes Relative: 7 % (ref 3–12)
Neutro Abs: 0.7 10*3/uL — ABNORMAL LOW (ref 1.7–7.7)
Neutrophils Relative %: 11 % — ABNORMAL LOW (ref 43–77)

## 2010-08-24 LAB — COMPREHENSIVE METABOLIC PANEL
ALT: 38 U/L (ref 0–53)
AST: 37 U/L (ref 0–37)
Albumin: 3.1 g/dL — ABNORMAL LOW (ref 3.5–5.2)
Alkaline Phosphatase: 79 U/L (ref 39–117)
BUN: 10 mg/dL (ref 6–23)
CO2: 28 mEq/L (ref 19–32)
Calcium: 8.7 mg/dL (ref 8.4–10.5)
Chloride: 110 mEq/L (ref 96–112)
Creatinine, Ser: 1 mg/dL (ref 0.4–1.5)
GFR calc Af Amer: >60 mL/min (ref >60)
GFR calc non Af Amer: >60 mL/min (ref >60)
Glucose, Bld: 97 mg/dL (ref 70–99)
Potassium: 3.9 mEq/L (ref 3.5–5.1)
Sodium: 141 mEq/L (ref 135–145)
Total Bilirubin: 0.5 mg/dL (ref 0.3–1.2)
Total Protein: 6.5 g/dL (ref 6.0–8.3)

## 2010-08-24 LAB — CBC
HCT: 31.6 % — ABNORMAL LOW (ref 39.0–52.0)
Hemoglobin: 10.4 g/dL — ABNORMAL LOW (ref 13.0–17.0)
MCH: 28.4 pg (ref 26.0–34.0)
MCHC: 32.9 g/dL (ref 30.0–36.0)
MCV: 86.3 fL (ref 78.0–100.0)
Platelets: 117 10*3/uL — ABNORMAL LOW (ref 150–400)
RBC: 3.66 MIL/uL — ABNORMAL LOW (ref 4.22–5.81)
RDW: 16.2 % — ABNORMAL HIGH (ref 11.5–15.5)
WBC: 6.6 10*3/uL (ref 4.0–10.5)

## 2010-08-24 LAB — URINE MICROSCOPIC-ADD ON

## 2010-08-24 LAB — URINALYSIS, ROUTINE W REFLEX MICROSCOPIC
Bilirubin Urine: NEGATIVE
Hgb urine dipstick: NEGATIVE
Ketones, ur: NEGATIVE mg/dL
Leukocytes, UA: NEGATIVE
Nitrite: NEGATIVE
Protein, ur: 30 mg/dL — AB
Specific Gravity, Urine: 1.014 (ref 1.005–1.030)
Urine Glucose, Fasting: NEGATIVE mg/dL
Urobilinogen, UA: 1 mg/dL (ref 0.0–1.0)
pH: 6 (ref 5.0–8.0)

## 2010-08-24 NOTE — Miscellaneous (Signed)
Summary: [facility] [place]   Imported By: Florinda Marker 08/15/2010 08:54:15  _____________________________________________________________________  External Attachment:    Type:   Image     Comment:   External Document

## 2010-08-25 DIAGNOSIS — R509 Fever, unspecified: Secondary | ICD-10-CM

## 2010-08-25 DIAGNOSIS — R51 Headache: Secondary | ICD-10-CM

## 2010-08-25 LAB — BASIC METABOLIC PANEL
BUN: 9 mg/dL (ref 6–23)
CO2: 28 mEq/L (ref 19–32)
Calcium: 8.1 mg/dL — ABNORMAL LOW (ref 8.4–10.5)
Chloride: 110 mEq/L (ref 96–112)
Creatinine, Ser: 1.05 mg/dL (ref 0.4–1.5)
GFR calc Af Amer: >60 mL/min (ref >60)
GFR calc non Af Amer: >60 mL/min (ref >60)
Glucose, Bld: 105 mg/dL — ABNORMAL HIGH (ref 70–99)
Potassium: 3.5 mEq/L (ref 3.5–5.1)
Sodium: 143 mEq/L (ref 135–145)

## 2010-08-25 LAB — RAPID URINE DRUG SCREEN, HOSP PERFORMED
Amphetamines: NOT DETECTED
Barbiturates: NOT DETECTED
Benzodiazepines: NOT DETECTED
Cocaine: NOT DETECTED
Opiates: NOT DETECTED
Tetrahydrocannabinol: NOT DETECTED

## 2010-08-25 LAB — CBC
HCT: 27.9 % — ABNORMAL LOW (ref 39.0–52.0)
Hemoglobin: 9.3 g/dL — ABNORMAL LOW (ref 13.0–17.0)
MCH: 28.5 pg (ref 26.0–34.0)
MCHC: 33.3 g/dL (ref 30.0–36.0)
MCV: 85.6 fL (ref 78.0–100.0)
Platelets: 115 10*3/uL — ABNORMAL LOW (ref 150–400)
RBC: 3.26 MIL/uL — ABNORMAL LOW (ref 4.22–5.81)
RDW: 16.2 % — ABNORMAL HIGH (ref 11.5–15.5)
WBC: 6.2 10*3/uL (ref 4.0–10.5)

## 2010-08-25 LAB — PATHOLOGIST SMEAR REVIEW

## 2010-08-25 LAB — VITAMIN B12: Vitamin B-12: 193 pg/mL — ABNORMAL LOW (ref 211–911)

## 2010-08-25 LAB — TSH: TSH: 4.601 u[IU]/mL — ABNORMAL HIGH (ref 0.350–4.500)

## 2010-08-25 LAB — MRSA PCR SCREENING: MRSA by PCR: NEGATIVE

## 2010-08-25 LAB — CRYPTOCOCCAL ANTIGEN: Crypto Ag: NEGATIVE

## 2010-08-26 ENCOUNTER — Inpatient Hospital Stay (HOSPITAL_COMMUNITY): Payer: BC Managed Care – PPO

## 2010-08-26 ENCOUNTER — Encounter (HOSPITAL_COMMUNITY): Payer: Self-pay | Admitting: Radiology

## 2010-08-26 DIAGNOSIS — R51 Headache: Secondary | ICD-10-CM

## 2010-08-26 DIAGNOSIS — B20 Human immunodeficiency virus [HIV] disease: Secondary | ICD-10-CM

## 2010-08-26 MED ORDER — GADOBENATE DIMEGLUMINE 529 MG/ML IV SOLN
15.0000 mL | Freq: Once | INTRAVENOUS | Status: AC
  Administered 2010-08-26: 15 mL via INTRAVENOUS

## 2010-08-28 ENCOUNTER — Inpatient Hospital Stay (HOSPITAL_COMMUNITY): Payer: BC Managed Care – PPO

## 2010-08-28 DIAGNOSIS — R51 Headache: Secondary | ICD-10-CM

## 2010-08-28 DIAGNOSIS — R509 Fever, unspecified: Secondary | ICD-10-CM

## 2010-08-28 LAB — CSF CELL COUNT WITH DIFFERENTIAL
RBC Count, CSF: 1120 /mm3 — ABNORMAL HIGH
Tube #: 1
WBC, CSF: 0 /mm3 (ref 0–5)

## 2010-08-28 LAB — BASIC METABOLIC PANEL
BUN: 10 mg/dL (ref 6–23)
CO2: 26 mEq/L (ref 19–32)
Calcium: 8 mg/dL — ABNORMAL LOW (ref 8.4–10.5)
Chloride: 111 mEq/L (ref 96–112)
Creatinine, Ser: 1.42 mg/dL (ref 0.4–1.5)
GFR calc Af Amer: >60 mL/min (ref >60)
GFR calc non Af Amer: 53 mL/min — ABNORMAL LOW (ref >60)
Glucose, Bld: 89 mg/dL (ref 70–99)
Potassium: 3.7 mEq/L (ref 3.5–5.1)
Sodium: 140 mEq/L (ref 135–145)

## 2010-08-28 LAB — WOUND CULTURE: Culture: NO GROWTH

## 2010-08-28 LAB — IRON AND TIBC
Iron: 48 ug/dL (ref 42–135)
Saturation Ratios: 23 % (ref 20–55)
TIBC: 213 ug/dL — ABNORMAL LOW (ref 215–435)
UIBC: 165 ug/dL

## 2010-08-28 LAB — RETICULOCYTES
RBC.: 3.14 MIL/uL — ABNORMAL LOW (ref 4.22–5.81)
Retic Count, Absolute: 84.8 10*3/uL (ref 19.0–186.0)
Retic Ct Pct: 2.7 % (ref 0.4–3.1)

## 2010-08-28 LAB — CBC
HCT: 27.2 % — ABNORMAL LOW (ref 39.0–52.0)
Hemoglobin: 8.8 g/dL — ABNORMAL LOW (ref 13.0–17.0)
MCH: 27.8 pg (ref 26.0–34.0)
MCHC: 32.4 g/dL (ref 30.0–36.0)
MCV: 86.1 fL (ref 78.0–100.0)
Platelets: 154 10*3/uL (ref 150–400)
RBC: 3.16 MIL/uL — ABNORMAL LOW (ref 4.22–5.81)
RDW: 16.7 % — ABNORMAL HIGH (ref 11.5–15.5)
WBC: 6.5 10*3/uL (ref 4.0–10.5)

## 2010-08-28 LAB — CRYPTOCOCCAL ANTIGEN, CSF: Crypto Ag: NEGATIVE

## 2010-08-28 LAB — PROTEIN AND GLUCOSE, CSF
Glucose, CSF: 44 mg/dL (ref 43–76)
Total  Protein, CSF: 37 mg/dL (ref 15–45)

## 2010-08-28 LAB — GRAM STAIN

## 2010-08-28 LAB — FOLATE: Folate: 2.9 ng/mL — ABNORMAL LOW

## 2010-08-28 LAB — T4, FREE: Free T4: 0.84 ng/dL (ref 0.80–1.80)

## 2010-08-28 LAB — VITAMIN B12: Vitamin B-12: 362 pg/mL (ref 211–911)

## 2010-08-28 LAB — FERRITIN: Ferritin: 160 ng/mL (ref 22–322)

## 2010-08-29 DIAGNOSIS — R509 Fever, unspecified: Secondary | ICD-10-CM

## 2010-08-29 DIAGNOSIS — R51 Headache: Secondary | ICD-10-CM

## 2010-08-30 LAB — HERPES SIMPLEX VIRUS(HSV) DNA BY PCR
HSV 1 DNA: NOT DETECTED
HSV 2 DNA: NOT DETECTED
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
## 2010-08-30 NOTE — Assessment & Plan Note (Signed)
Summary: COUGH/LEG SWELLING FEET PAIN/VS   Vital Signs:  Patient profile:   50 year old male Height:      69 inches Weight:      175 pounds BMI:     25.94 Temp:     98.2 degrees F oral Pulse rate:   86 / minute BP sitting:   105 / 68  (left arm)  Vitals Entered By: Alesia Morin CMA (August 19, 2010 9:26 AM) CC: pt in today due to recent Dx of pneumonia he is still having fever at night and the cough that is productive Is Patient Diabetic? No Pain Assessment Patient in pain? no      Nutritional Status BMI of 25 - 29 = overweight Nutritional Status Detail appetite "good"  Have you ever been in a relationship where you felt threatened, hurt or afraid?No   Does patient need assistance? Functional Status Self care Ambulation Normal Comments pt says that he can not afford the medication and needs help   CC:  pt in today due to recent Dx of pneumonia he is still having fever at night and the cough that is productive.  History of Present Illness: Treated for pneumonia in January 2012 in [place], [place].  Still reports fevers (Tmax?) and ongoing productive cough with clear to greenish sputum.  Seen by Dr. Orvan Falconer approx. 4 weeks ago with follow-up, relates symptoms have not resolved completed.  Denies SOB or DOE, no pleuritic CP, but cough persistent especially at night.  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  Also endorses having "sores" on both feet betwen some of his toes.  Denies drainage or foul odor, but does have c/o burning itching sensation to areas.  Has hx of non-adherence to medical regimens, mostly, he claims, due to financial issues, although he does admit developing resistance on EFV-based reguimen.  Currently he is to be on Prezista, Norvir, Truvada, but has not had the funds to finance co-pays on medications.  He did not follow-up with PAP, and has not spoken with anyone since his last  visit with Dr. Orvan Falconer.  Preventive Screening-Counseling & Management  Alcohol-Tobacco     Alcohol drinks/day: 0     Smoking Status: quit < 6 months     Cigars/week: 3 packs of cigars/wk  Caffeine-Diet-Exercise     Caffeine use/day: yes     Does Patient Exercise: yes     Type of exercise: weight lifting     Exercise (avg: min/session): <30     Times/week: <3  Hep-HIV-STD-Contraception     HIV Risk: no risk noted  Safety-Violence-Falls     Seat Belt Use: yes      Sexual History:  n/a.        Drug Use:  never.    Allergies: 1)  ! Pcn 2)  ! Sulfa  Review of Systems       The patient complains of anorexia, fever, weight loss, peripheral edema, prolonged cough, headaches, depression, and breast masses.  The patient denies weight gain, vision loss, decreased hearing, hoarseness, chest pain, syncope, dyspnea on exertion, hemoptysis, abdominal pain, melena, hematochezia, severe indigestion/heartburn, hematuria, incontinence, genital sores, muscle weakness, suspicious skin lesions, transient blindness, difficulty walking, unusual weight change, abnormal bleeding, enlarged lymph nodes, angioedema, and testicular masses.         SEE HPI  Physical Exam  General:  alert, well-developed, underweight appearing, pale, dusky, disheveled, and uncomfortable-appearing.   Head:  normocephalic, atraumatic,  no abnormalities observed, and no abnormalities palpated.   Eyes:  vision grossly intact, pupils equal, pupils round, and pupils reactive to light.   Ears:  R ear normal and L ear normal.   Mouth:  fair dentition and white plaque(s).   Neck:  supple, full ROM, and no masses.   Breasts:  Left niople enlarged but without active active drainage noted, erythema noted at base.  Slight tenderness to touch Lungs:  normal respiratory effort and L diffuse crackles.   Heart:  normal rate, regular rhythm, no murmur, no gallop, no rub, and no JVD.   Abdomen:  soft, non-tender, normal bowel sounds, no  distention, no hepatomegaly, and no splenomegaly.   Msk:  normal ROM, no joint tenderness, no joint swelling, and no joint warmth.   Extremities:  1+ left pedal edema and 1+ right pedal edema.   Neurologic:  alert & oriented X3, cranial nerves II-XII intact, strength normal in all extremities, sensation intact to light touch, sensation intact to pinprick, and gait normal.   Skin:  Color diffusely pale and slightly ash-like.  Skin diffusely dry, xerotic; (+) open excoriationn to digit interspaces between right 3rd and 4th toes and  left 2nd and 3rd toes.  No drainage noted. Cervical Nodes:  Diffuse LAD A&P Axillary Nodes:  (+) enlarged, soft, mobile, non-tender LAD bilaterally Psych:  Oriented X3, memory intact for recent and remote, normally interactive, good eye contact, agitated, and angry.     Impression & Recommendations:  Problem # 1:  PNEUMONIA (ICD-486) While cough is lingering, subjectively and objectively he does not appear to have an active acute problem.  His fevers might have multiple active causes.  While antibiotic therapy is intended to cover other active infections, they should also provide adequate coverage for CAP organisms as well.  Will continue to monitor. His updated medication list for this problem includes:    Bactrim Ds 800-160 Mg Tabs (Sulfamethoxazole-trimethoprim) .[name]... Take 1 tablet by mouth once a day    Azithromycin 600 Mg Tabs (Azithromycin) .[name]... Take 2 tablets by mouth once a week    Augmentin 500-125 Mg Tabs (Amoxicillin-pot clavulanate) .[name]... Take 1 tablet by mouth once a day    Doxycycline Hyclate 100 Mg Caps (Doxycycline hyclate) .[name]... 1 tablet by mouth every 12 hours until gone    Ciprofloxacin Hcl 500 Mg Tabs (Ciprofloxacin hcl) .[name]... 1 tablet by mouth every 12 hours until gone  Problem # 2:  FEVER, RECURRENT (ICD-087.9) This mostly subjective and reported by both patient and family who was present with him.  Given the advanced state of his HIV,  deep-seated infections must also be considered including MAC.  We will review to see if MAC cultures were obtained in recent dates.  If not, these should be considered.  Also, recurr3ent abscess to left brerast may be a contributing factor.  Coverage for susceptible organisms should be provided.  Problem # 3:  DERMATOPHYTOSIS OF FOOT (ICD-110.4) Findings of open excoriations portend possible secondary bacterial infections on top of fungal elements.  Recommend 7-day course by mouth fluconazole 100 mg and add Ciprofloxacin 500mg  by mouth two times a day, and doxycycline 100mg  by mouth two times a day for a 10-day course, and re-evaluate in 2 weeks. Orders: Est. Patient Level V (09811)  Problem # 4:  HIV INFECTION (ICD-042) He remains off ARV's and has not had follow-up with PAP program or ADAP.  He claims recently he lost his job and no longer has health i surance coverage.  When asked why he did not contact abnyone in clinic regarding this, he admitted "no one told me to."  We rteferred him back to Eligibility and PAP to apply for ADAP and Patient Assistance with medications.  We dispensed 1 month supply of Prezzista, Norvir, Truvada, with the explicit instructions we will only be able to assist him for 30 days and the time remaiuning he must do his follow-up to obtain medications.  Verbally acknowledged this..  Additionally, jhe claims he has not been taking his PCP or MAC prophylaxis as he claimsmhe thought he had to take them only if he was taking ARV's.  Re-educated on the need to take prophy meds irrespective of ARV therapy and the inherent risks if he did not take them. His updated medication list for this problem includes:    Bactrim Ds 800-160 Mg Tabs (Sulfamethoxazole-trimethoprim) .[name]... Take 1 tablet by mouth once a day    Azithromycin 600 Mg Tabs (Azithromycin) .[name]... Take 2 tablets by mouth once a week    Augmentin 500-125 Mg Tabs (Amoxicillin-pot clavulanate) .[name]... Take 1 tablet by mouth once  a day    Doxycycline Hyclate 100 Mg Caps (Doxycycline hyclate) .[name]... 1 tablet by mouth every 12 hours until gone    Ciprofloxacin Hcl 500 Mg Tabs (Ciprofloxacin hcl) .[name]... 1 tablet by mouth every 12 hours until gone  Orders: Est. 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  This may clearly have organic etiologies.  Evaluation for HAD or CNS OI;'s may be warranted if this pattern persists.  We will ciontuinue to monitor this on follow-up in 2 weeks.  Medications Added to Medication List This Visit: 1)  Doxycycline Hyclate 100 Mg Caps (Doxycycline hyclate) .[name].. 1 tablet by mouth every 12 hours until gone 2)  Ciprofloxacin Hcl 500 Mg Tabs (Ciprofloxacin hcl) .[name].. 1 tablet by mouth every 12 hours until gone 3)  Bactroban 2 % Oint (Mupirocin) .... Apply thin layer to affected area two times a day as directed Prescriptions: BACTROBAN 2 % OINT (MUPIROCIN) Apply thin layer to affected area two times a day as directed  #22g x 1   Entered and Authorized by:   Talmadge Chad NP   Signed by:   Talmadge Chad NP on 08/19/2010   Method used:   Print then Give to Patient   RxID:   [id number] CIPROFLOXACIN HCL 500 MG TABS (CIPROFLOXACIN HCL) 1 tablet by mouth every 12 hours until gone  #20 x 1   Entered and Authorized by:   Talmadge Chad NP   Signed by:   Talmadge Chad NP on 08/19/2010   Method used:   Print then Give to Patient   RxID:   [id number] DOXYCYCLINE HYCLATE 100 MG CAPS (DOXYCYCLINE HYCLATE) 1 tablet by mouth every 12 hours until gone  #20 x 1   Entered and Authorized by:   Talmadge Chad NP   Signed by:   Talmadge Chad NP on 08/19/2010   Method used:   Print then Give to Patient   RxID:   [id number]    Orders Added: 1)  Est. Patient Level V [25366]
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
## 2010-08-31 ENCOUNTER — Telehealth (INDEPENDENT_AMBULATORY_CARE_PROVIDER_SITE_OTHER): Payer: Self-pay | Admitting: *Deleted

## 2010-08-31 LAB — CSF CULTURE W GRAM STAIN: Culture: NO GROWTH

## 2010-08-31 LAB — CULTURE, BLOOD (ROUTINE X 2)
Culture  Setup Time: 201202230133
Culture  Setup Time: 201202230133
Culture: NO GROWTH
Culture: NO GROWTH

## 2010-08-31 NOTE — Consult Note (Signed)
NAME:  ALIZE, ACY NO.:  [id number]  MEDICAL RECORD NO.:  [id number]           PATIENT TYPE:  I  LOCATION:  2004                         FACILITY:  [facility]  PHYSICIAN:  Angelia Mould. Derrell Lolling, M.D.DATE OF BIRTH:  [birth year]  DATE OF CONSULTATION: DATE OF DISCHARGE:                                CONSULTATION   HISTORY OF PRESENT ILLNESS:  Mr. Garman is a pleasant 50 year old gentleman with history of HIV, who presented to the Internal Medicine Teaching Service with complaint of ongoing headache.  The patient did have some recent trauma; however, given his HIV and immunocompromised status, there was a concern for meningitis.  Therefore, the patient was admitted on August 24, 2010.  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  In the meantime; however, the patient has brought to the attention the evidence of chronic left breast abscess.  In 2010, he had an incision and drainage by Dr. Derrell Lolling for this, as it was acutely infected.  Since that time, he states he has had ongoing issues with intermittent drainage, swelling, and pain.  He was seen in [place] and at that time, he had an ultrasound and subsequent ultrasound-guided aspiration of two small fluid collection in the subareolar location.  However, he states he continues to have intermittent drainage from the nipple and brought it to the attention of the Teaching Service while here.  Therefore, Surgery is asked to consult on the patient for possible surgical intervention during this admission.  The patient denies any fever, denies any chills, denies any trauma to that chest.  He has again been seen by [place] most recently in 2010, where he did have an excisional biopsy of the left breast mass and incision and drainage of an abscess by Dr. Derrell Lolling at that time.  He has not had any formal imaging this  admission.  PAST MEDICAL HISTORY:  Notable for: 1. HIV recurrent. 2. Hypokalemia. 3. Anemia. 4. Leukopenia.  SURGICAL HISTORY:  As noted in the HPI.  SOCIAL HISTORY:  The patient is a IT trainer.  He does smoke, but does not use any illicit drugs or drink alcohol.  REVIEW OF SYSTEMS:  Noncontributory at the present case.  MEDICATIONS:  Include: 1. Diflucan 100 mg daily. 2. Flonase nasal spray daily.  ALLERGIES:  Include PENICILLIN and SULFA.  REVIEW OF SYSTEMS:  Please see history of present illness for pertinent findings.  PHYSICAL EXAMINATION:  GENERAL:  A 50 year old African American gentleman who is nontoxic, in no acute distress.  VITAL SIGNS: Temperature is 97.9, heart rate of 78, blood pressure of 117/73, respiratory rate of 16. CHEST:  Exam was limited to lungs and heart.  His lungs are clear to auscultation.  No wheezes, rhonchi, or rales.  Normal respiratory effort without use of accessory muscles.  Breast does have bilateral gynecomastia, and is mild.  The right side demonstrates soft, no mass effect, no nipple drainage or dimpling or palpable lesions.  The left side demonstrates a firmness with nodular densities in the subareolar area that  is somewhat mobile, minimally tender.  No overlying erythema. No drainage from the nipple, but there is a little bit of retraction of some nipple tissue.  No axillary lymphadenopathy is appreciated.  Heart is regular rate and rhythm.  No murmurs, gallops, or rubs.  Carotids 2+ brisk, without bruits.  Peripheral pulses 2+ and symmetrical.  IMAGING:  None pertaining to the chest.  DIAGNOSTICS:  Most recent CBC shows a white blood cell count of 6.5, hemoglobin 8.8, hematocrit 27.2, platelet count 154.  Metabolic panel shows normal electrolytes.  Wound culture performed initially showed rare gram-positive cocci and rare gram-negative rods, but ultimately yielded no growth.  IMPRESSION: 1. Chronic left breast cyst/abscess. 2.  Human immunodeficiency virus. 3. Chronic headaches - postconcussive syndrome suspected.  PLAN:  At this point, we feel there is no urgent need for inpatient management of his left breast lesion.  I feel that the patient does need an outpatient ultrasound to document these lesions and if necessary, repeat aspiration; however, the patient is more likely to need definitive excisional biopsy unless he develops acute severe infection first.  We will otherwise see the patient back in our clinic in a couple of weeks after his ultrasound is performed.  I appreciate the opportunity to participate in this patient's care.     Brayton El, PA-C   ______________________________ Angelia Mould. Derrell Lolling, M.D.    KB/MEDQ  D:  08/29/2010  T:  08/30/2010  Job:  [id number]  cc:   Whitney Post, MD  Electronically Signed by Brayton El  on 08/30/2010 11:32:26 AM Electronically Signed by Claud Kelp M.D. on 08/31/2010 01:19:31 PM

## 2010-09-01 ENCOUNTER — Other Ambulatory Visit (HOSPITAL_COMMUNITY): Payer: BC Managed Care – PPO

## 2010-09-01 ENCOUNTER — Other Ambulatory Visit: Payer: BC Managed Care – PPO

## 2010-09-01 LAB — FUNGUS CULTURE, BLOOD: Culture: NO GROWTH

## 2010-09-02 ENCOUNTER — Emergency Department (HOSPITAL_COMMUNITY)
Admission: EM | Admit: 2010-09-02 | Discharge: 2010-09-02 | Disposition: A | Discharge status: Home / Self Care | Payer: BC Managed Care – PPO | Attending: Emergency Medicine | Admitting: Emergency Medicine

## 2010-09-02 ENCOUNTER — Ambulatory Visit
Admission: RE | Admit: 2010-09-02 | Discharge: 2010-09-02 | Disposition: A | Discharge status: Home / Self Care | Payer: BC Managed Care – PPO | Source: Ambulatory Visit | Attending: General Surgery | Admitting: General Surgery

## 2010-09-02 ENCOUNTER — Telehealth: Payer: Self-pay | Admitting: Internal Medicine

## 2010-09-02 ENCOUNTER — Encounter: Payer: Self-pay | Admitting: Adult Health

## 2010-09-02 DIAGNOSIS — R509 Fever, unspecified: Secondary | ICD-10-CM | POA: Insufficient documentation

## 2010-09-02 DIAGNOSIS — N6009 Solitary cyst of unspecified breast: Secondary | ICD-10-CM | POA: Insufficient documentation

## 2010-09-02 DIAGNOSIS — B20 Human immunodeficiency virus [HIV] disease: Secondary | ICD-10-CM | POA: Insufficient documentation

## 2010-09-02 DIAGNOSIS — R197 Diarrhea, unspecified: Secondary | ICD-10-CM | POA: Insufficient documentation

## 2010-09-02 DIAGNOSIS — L0291 Cutaneous abscess, unspecified: Secondary | ICD-10-CM | POA: Insufficient documentation

## 2010-09-02 LAB — CBC
HCT: 32.3 % — ABNORMAL LOW (ref 39.0–52.0)
Hemoglobin: 10.6 g/dL — ABNORMAL LOW (ref 13.0–17.0)
MCH: 29.1 pg (ref 26.0–34.0)
MCHC: 32.8 g/dL (ref 30.0–36.0)
MCV: 88.7 fL (ref 78.0–100.0)
Platelets: 163 10*3/uL (ref 150–400)
RBC: 3.64 MIL/uL — ABNORMAL LOW (ref 4.22–5.81)
RDW: 17 % — ABNORMAL HIGH (ref 11.5–15.5)
WBC: 7.4 10*3/uL (ref 4.0–10.5)

## 2010-09-02 LAB — DIFFERENTIAL
Basophils Absolute: 0.1 10*3/uL (ref 0.0–0.1)
Basophils Relative: 1 % (ref 0–1)
Eosinophils Absolute: 4.2 10*3/uL — ABNORMAL HIGH (ref 0.0–0.7)
Eosinophils Relative: 57 % — ABNORMAL HIGH (ref 0–5)
Lymphocytes Relative: 19 % (ref 12–46)
Lymphs Abs: 1.4 10*3/uL (ref 0.7–4.0)
Monocytes Absolute: 0.5 10*3/uL (ref 0.1–1.0)
Monocytes Relative: 7 % (ref 3–12)
Neutro Abs: 1.2 10*3/uL — ABNORMAL LOW (ref 1.7–7.7)
Neutrophils Relative %: 16 % — ABNORMAL LOW (ref 43–77)

## 2010-09-02 LAB — BASIC METABOLIC PANEL
BUN: 16 mg/dL (ref 6–23)
CO2: 29 mEq/L (ref 19–32)
Calcium: 9.1 mg/dL (ref 8.4–10.5)
Chloride: 106 mEq/L (ref 96–112)
Creatinine, Ser: 0.96 mg/dL (ref 0.4–1.5)
GFR calc Af Amer: >60 mL/min (ref >60)
GFR calc non Af Amer: >60 mL/min (ref >60)
Glucose, Bld: 89 mg/dL (ref 70–99)
Potassium: 3.6 mEq/L (ref 3.5–5.1)
Sodium: 139 mEq/L (ref 135–145)

## 2010-09-05 ENCOUNTER — Other Ambulatory Visit: Payer: Self-pay | Admitting: Adult Health

## 2010-09-05 ENCOUNTER — Ambulatory Visit (INDEPENDENT_AMBULATORY_CARE_PROVIDER_SITE_OTHER): Payer: Self-pay | Admitting: Adult Health

## 2010-09-05 ENCOUNTER — Encounter: Payer: Self-pay | Admitting: Adult Health

## 2010-09-05 DIAGNOSIS — B20 Human immunodeficiency virus [HIV] disease: Secondary | ICD-10-CM

## 2010-09-05 DIAGNOSIS — R51 Headache: Secondary | ICD-10-CM

## 2010-09-05 DIAGNOSIS — N61 Mastitis without abscess: Secondary | ICD-10-CM

## 2010-09-05 DIAGNOSIS — A689 Relapsing fever, unspecified: Secondary | ICD-10-CM

## 2010-09-05 LAB — CONVERTED CEMR LAB
Basophils Absolute: 0 10*3/uL (ref 0.0–0.1)
Basophils Relative: 0 % (ref 0–1)
Eosinophils Absolute: 6.1 10*3/uL — ABNORMAL HIGH (ref 0.0–0.7)
Eosinophils Relative: 66 % — ABNORMAL HIGH (ref 0–5)
HCT: 33.8 % — ABNORMAL LOW (ref 39.0–52.0)
HIV 1 RNA Quant: 72200 copies/mL — ABNORMAL HIGH (ref <20)
HIV-1 RNA Quant, Log: 4.86 — ABNORMAL HIGH (ref <1.30)
Hemoglobin: 10.8 g/dL — ABNORMAL LOW (ref 13.0–17.0)
Lymphocytes Relative: 16 % (ref 12–46)
Lymphs Abs: 1.5 10*3/uL (ref 0.7–4.0)
MCHC: 32 g/dL (ref 30.0–36.0)
MCV: 89.4 fL (ref 78.0–100.0)
Monocytes Absolute: 0.6 10*3/uL (ref 0.1–1.0)
Monocytes Relative: 7 % (ref 3–12)
Neutro Abs: 1.1 10*3/uL — ABNORMAL LOW (ref 1.7–7.7)
Neutrophils Relative %: 12 % — ABNORMAL LOW (ref 43–77)
Platelets: 177 10*3/uL (ref 150–400)
RBC: 3.78 M/uL — ABNORMAL LOW (ref 4.22–5.81)
RDW: 17.1 % — ABNORMAL HIGH (ref 11.5–15.5)
WBC: 9.3 10*3/uL (ref 4.0–10.5)

## 2010-09-05 LAB — PATHOLOGIST SMEAR REVIEW

## 2010-09-08 ENCOUNTER — Other Ambulatory Visit: Payer: Self-pay | Admitting: Adult Health

## 2010-09-08 ENCOUNTER — Encounter: Payer: Self-pay | Admitting: Adult Health

## 2010-09-08 DIAGNOSIS — D649 Anemia, unspecified: Secondary | ICD-10-CM

## 2010-09-08 DIAGNOSIS — R197 Diarrhea, unspecified: Secondary | ICD-10-CM

## 2010-09-08 NOTE — Progress Notes (Addendum)
  Phone Note Call from Patient   Caller: Sister Call For: Talmadge Chad Reason for Call: Acute Illness, Talk to Doctor Summary of Call: Patients sister Ms Obie Dredge called adv that patient was released from hospital on Sunday, Feb 26 and since that time he has run a fever and had a sever headache and been in the bed. She says that he feels worse now than before he went last week (02/22). That the discharge from his breast area is still there and is green and smells really bad. He has an appt with [facility] on 03/13 to have the area ultrasounded and possibly operated on to remove whatever mass is there. However at this time they are concerned for his health at this moment and wants Brad to advise them on what to do. Advised her I will relay her concerned to [facility] and give her a call back this afternoon as soon as i know anything. She left a contact number of [phone] Ms Obie Dredge. Alesia Morin CMA  August 31, 2010 11:22 AM Initial call taken by: Alesia Morin CMA,  August 31, 2010 11:22 AM  Follow-up for Phone Call        Spoke with the patients sister and advised her to take the patient back to the hospital if the patient is not doing any better. She stated that she was concerned with the way the staff treated her brother and advised her to take him anyway and to voice her concerns over her brothers treatment to the doctor on duty and assured her that her brother would be taken care of.  Follow-up by: Alesia Morin CMA,  August 31, 2010 12:25 PM

## 2010-09-08 NOTE — Progress Notes (Signed)
  Phone Note Call from Patient   Summary of Call:  patient's sister called from [facility] ER tonight.  patient was brought into the ER because of pain in his left breast in the per discharge.  patient had an ultrasound done on September 02, 1998 is which showed 2 cm abscess in his left breast requiring drainage for which he has an appointment with [facility] [place] [facility] on March 13.  patient was having increased pain and discharge from the abscess and so he was brought to the ER.  [facility] ER physician did not feel the need for urgent drainage but the sister was not satisfied with this. the ER physician also called Dr. Ezzard Standing  and he also suggested continuing medical management and asked the patient to call [facility] [place] [facility] in the morning for an earlyappointment if needed.  I discussed with the patient's sister and tried to explain her that he does not need any urgen surgical procedure tonight and if his condition gets worse to bring him to the ER again or call the ID clinic. PAtient is on bactrim. Discharged from [facility] on 2/27.  Initial call taken by: Bethel Born MD,  September 02, 2010 10:58 PM

## 2010-09-09 ENCOUNTER — Telehealth: Payer: Self-pay | Admitting: Adult Health

## 2010-09-09 LAB — GIARDIA/CRYPTOSPORIDIUM (EIA)
Cryptosporidium Screen (EIA): NEGATIVE
Giardia Screen (EIA): NEGATIVE

## 2010-09-09 LAB — CLOSTRIDIUM DIFFICILE BY PCR: Toxigenic C. Difficile by PCR: NOT DETECTED

## 2010-09-10 LAB — OVA AND PARASITE EXAMINATION: OP: NONE SEEN

## 2010-09-13 NOTE — Progress Notes (Addendum)
  Phone Note Call from Patient   Summary of Call: sister April Obie Dredge called to ask what to do about his fever, sweats & fine rash. he is taking aleve & most recent temp was 98.4. states he is drinking plenty of fluids. she is very concerned. offered 2pm appt. unable to make it. states she will take him to UC this am as she has work in pm Initial call taken by: Golden Circle RN,  September 09, 2010 10:43 AM

## 2010-09-13 NOTE — Assessment & Plan Note (Signed)
Summary: 2WK F/U/VS   Vital Signs:  Patient profile:   50 year old male Height:      69 inches Weight:      175.4 pounds BMI:     26.00 Temp:     98.5 degrees F oral Pulse rate:   93 / minute BP sitting:   116 / 83  (left arm)  Vitals Entered By: Alesia Morin CMA (September 05, 2010 9:39 AM) CC: follow-up visit to hospital dischage. Pt is still having pain and drainage in the L breast. Still having headaches and fevers with swelling in his R ankle. Rash on his neck hands and legs. Is Patient Diabetic? No Pain Assessment Patient in pain? no      Nutritional Status BMI of > 30 = obese Nutritional Status Detail appetite "good"  Have you ever been in a relationship where you felt threatened, hurt or afraid?No   Does patient need assistance? Functional Status Self care Ambulation Normal Comments no missed meds   CC:  follow-up visit to hospital dischage. Pt is still having pain and drainage in the L breast. Still having headaches and fevers with swelling in his R ankle. Rash on his neck hands and legs..  History of Present Illness: Dischrged 08/29/10 from [facility] after admission for fever, headache, left breast abscess.  Work-up revealed only mild cortical atrophy on MRI, negative LP, (+) strep infection of breast tissue, and a normocytic anemia.  2 days post dschg, he remained with headaches and fever, returned to [facility] ED, but was only given symptom relief.  Currently his compalints include: (1) ongoing headaches usually originating in back of head and progressively diffuse.  Denies neck stiffness, no vomitting.  (2)  New development of rash over neck, chest, back, arms, legs.  (+) pruritis, areas of open sores, and darkened patches.  (3) 2-week hx of diarrhea.  Stools described as loose to liquid, often following 45 min after eating, (4) ongoing fevers, although he has not taken his temp, but having associated chills, malaise, and sweats during febrile episodes, (5) left breast pain from abscess  (scheduled for follow-up with [facility] on 09/13/10).  Preventive Screening-Counseling & Management  Alcohol-Tobacco     Alcohol drinks/day: 0     Smoking Status: quit < 6 months     Cigars/week: 3 packs of cigars/wk  Caffeine-Diet-Exercise     Caffeine use/day: yes     Does Patient Exercise: no     Type of exercise: weight lifting     Exercise (avg: min/session): <30     Times/week: <3  Hep-HIV-STD-Contraception     HIV Risk: no risk noted  Safety-Violence-Falls     Seat Belt Use: yes      Sexual History:  n/a.        Drug Use:  never.        Blood Transfusions:  no.        Travel History:  no.    Comments: pt declined condoms  Allergies: 1)  ! Pcn 2)  ! Sulfa  Social History: Blood Transfusions:  no Travel History:  no  Review of Systems General:  Complains of chills, fatigue, fever, malaise, sweats, and weakness; denies loss of appetite, sleep disorder, and weight loss. Eyes:  Denies blurring, discharge, double vision, eye irritation, eye pain, halos, and itching. ENT:  Complains of difficulty swallowing; denies decreased hearing, ear discharge, earache, hoarseness, nasal congestion, postnasal drainage, ringing in ears, sinus pressure, and sore throat. CV:  Denies bluish  discoloration of lips or nails, chest pain or discomfort, difficulty breathing at night, difficulty breathing while lying down, fainting, fatigue, leg cramps with exertion, lightheadness, near fainting, palpitations, shortness of breath with exertion, swelling of feet, swelling of hands, and weight gain. Resp:  Denies chest discomfort, chest pain with inspiration, cough, coughing up blood, excessive snoring, hypersomnolence, morning headaches, pleuritic, shortness of breath, sputum productive, and wheezing. GI:  Complains of diarrhea and nausea; denies abdominal pain, bloody stools, change in bowel habits, constipation, dark tarry stools, excessive appetite, gas, hemorrhoids,  indigestion, loss of appetite, vomiting, vomiting blood, and yellowish skin color. GU:  Complains of decreased libido; denies discharge, dysuria, erectile dysfunction, genital sores, hematuria, incontinence, nocturia, urinary frequency, and urinary hesitancy. MS:  Denies joint pain, joint redness, joint swelling, loss of strength, low back pain, mid back pain, muscle, cramps, and muscle weakness. Derm:  Complains of dryness, itching, lesion(s), and rash; denies changes in color of skin, changes in nail beds, excessive perspiration, flushing, hair loss, insect bite(s), and poor wound healing. Neuro:  Complains of headaches and weakness; denies brief paralysis, difficulty with concentration, disturbances in coordination, falling down, inability to speak, memory loss, numbness, poor balance, seizures, sensation of room spinning, tingling, tremors, and visual disturbances. Psych:  Denies alternate hallucination ( auditory/visual), anxiety, depression, easily angered, easily tearful, irritability, mental problems, panic attacks, sense of great danger, suicidal thoughts/plans, thoughts of violence, unusual visions or sounds, and thoughts /plans of harming others. Heme:  Complains of fevers and pallor; denies abnormal bruising, bleeding, and enlarge lymph nodes.  Physical Exam  General:  alert, well-developed, well-nourished, and well-hydrated.   Head:  normocephalic and atraumatic.   Eyes:  vision grossly intact, pupils equal, pupils round, and pupils reactive to light.   Ears:  R ear normal and L ear normal.   Nose:  no external deformity, no external erythema, no nasal discharge, and no sinus percussion tenderness.   Mouth:  pharynx pink and moist, no lesions, and fair dentition.   Neck:  supple, full ROM, and no masses.   Breasts:  Left breast nipple and areolar region enlarged, tender, but no active drainage noted Lungs:  normal respiratory effort, no intercostal retractions, and normal breath  sounds.   Heart:  normal rate, regular rhythm, no murmur, no gallop, and no rub.   Abdomen:  soft, non-tender, normal bowel sounds, no distention, no masses, no hepatomegaly, and no splenomegaly.   Msk:  normal ROM, no joint tenderness, and no joint swelling.   Extremities:  No clubbing, cyanosis, edema, or deformity noted with normal full range of motion of all joints.   Neurologic:  alert & oriented X3, cranial nerves II-XII intact, strength normal in all extremities, and gait normal.   Skin:  hyperpigmented circular patches with open shallow excoriations diffusely distributed over neck, back,chest, abd, arms and legs.  Open excoritions of interdigit spacing between toes of feet are healing well. Cervical Nodes:  Diffuse A&P LAD Axillary Nodes:  Enlarged, soft, mobile LAD bilaterally Psych:  Oriented X3, memory intact for recent and remote, normally interactive, good eye contact, and subdued.     Impression & Recommendations:  Problem # 1:  HIV INFECTION (ICD-042) Will continue current ARV therapy and PCP + MAC prophy for now.  Will check VL with reflex to genotype today. His updated medication list for this problem includes:    Bactrim Ds 800-160 Mg Tabs (Sulfamethoxazole-trimethoprim) .[name]... Take 1 tablet by mouth once a day    Azithromycin 600 Mg  Tabs (Azithromycin) .[name]... Take 2 tablets by mouth once a week      Orders: Est. Patient Level IV (16109) T-HIV1 Quant rflx Ultra or Genotype (60454-09811)  Problem # 2:  HEADACHE, CHRONIC (ICD-784.0) Headaches seem to have a tension-like character.  Will try Vicoprofen and use relaxation measures as well and monitor response His updated medication list for this problem includes:    Hydrocodone-ibuprofen 7.5-200 Mg Tabs (Hydrocodone-ibuprofen) .[name]... 1 tab by mouth q6h as needed heaqache  Orders: Est. Patient Level IV (91478)  Problem # 3:  FEVER, RECURRENT (ICD-087.9) With presence of diarrhea, must also r/o DMAC, but could also be  related to his unresolved abscess of left breast.  Meanwhile, we will obtain blood culture for AFB as this was not drawn while in hospital,and continue to monitor this. Orders: Est. Patient Level IV (29562) T-Culture & Smear AFB (87206/[id number]) T- * Misc. Laboratory test [phone])  Problem # 4:  DIARRHEA (ICD-787.91) Will obtain stool series, ask him to monitor time line and frequency of stools (pathologic vs functional?), use loperimide 2mg  after each loose stool not to exceed 4 doses in 24 hours, BRAT diet for the next 48 hours followed by low fat bland lactose- and caffeine-free diet.  Will follow-up with results and symptoms in 2 week f/u.  Orders: T-Giardia/Cryptospordium (87328/[id number]) T-GI OP (ova & parasites) ([id number]) T-Culture, Stool (87045/[id number]) T-C diff by PCR (28413) T-Culture & Smear AFB (87206/[id number])  Problem # 5:  DERMATOPHYTOSIS OF FOOT (ICD-110.4) Assessment: Improved Will add topical antifungal cream with mupirocin as ongoing treatment measure. The following medications were removed from the medication list:    Ketoconazole 2 % Crea (Ketoconazole) .[name]... Apply sparingly to affected areas between toes two times a day as directed His updated medication list for this problem includes:    Nystatin 100000 Unit/gm Crea (Nystatin) .[name]... 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  Due to open wounds, will re-start doxycycline 100mg  by mouth q12h for next two weeks.  Instructed to place mupoirocin oint sparingly on open wound spots.  Will check CBC w/diff to assess for eosinophilia.  Problem # 7:  ABSCESS, BREAST, LEFT, CHRONIC (ICD-611.0) Scheduled for eval by [facility] on 09/13/10.  Instructed to keep appointment as arranged. Orders: Est. Patient Level IV (24401)  Medications Added to Medication List This Visit: 1)   Ketoconazole 2 % Crea (Ketoconazole) .... Apply sparingly to affected areas between toes two times a day as directed 2)  Doxycycline Hyclate 100 Mg Caps (Doxycycline hyclate) .[name].. 1 cap by mouth q12h x 2 weeks 3)  Hydrocodone-ibuprofen 7.5-200 Mg Tabs (Hydrocodone-ibuprofen) .[name].. 1 tab by mouth q6h as needed heaqache 4)  Nystatin 100000 Unit/gm Crea (Nystatin) .... Apply sparingly to affected areas on feet as directed two times a day  Other Orders: T-CBC w/Diff ([id number]) Prescriptions: NYSTATIN 100000 UNIT/GM CREA (NYSTATIN) Apply sparingly to affected areas on feet as directed two times a day  #30 gm x 1   Entered and Authorized by:   Talmadge Chad NP   Signed by:   Talmadge Chad NP on 09/06/2010   Method used:   Print then Give to Patient   RxID:   [id number] HYDROCODONE-IBUPROFEN 7.5-200 MG TABS (HYDROCODONE-IBUPROFEN) 1 tab by mouth q6h as needed heaqache  #50 x 0   Entered and Authorized by:   Talmadge Chad NP   Signed by:   Talmadge Chad NP  on 09/05/2010   Method used:   Print then Give to Patient   RxID:   [id number] DOXYCYCLINE HYCLATE 100 MG CAPS (DOXYCYCLINE HYCLATE) 1 cap by mouth q12h x 2 weeks  #28 x 0   Entered and Authorized by:   Talmadge Chad NP   Signed by:   Talmadge Chad NP on 09/05/2010   Method used:   Print then Give to Patient   RxID:   [id number] KETOCONAZOLE 2 % CREA (KETOCONAZOLE) Apply sparingly to affected areas between toes two times a day as directed  #30g x 1   Entered and Authorized by:   Talmadge Chad NP   Signed by:   Talmadge Chad NP on 09/05/2010   Method used:   Print then Give to Patient   RxID:   [id number]    Orders Added: 1)  Est. Patient Level IV [01027] 2)  T-HIV1 Quant rflx Ultra or Genotype [25366-44034] 3)  T-CBC w/Diff [[id number]] 4)  T-Giardia/Cryptospordium [87328/87329-70350] 5)  T-GI OP (ova & parasites) [87177-70555] 6)  T-Culture, Stool  [87045/[id number]] 7)  T-C diff by PCR [81755] 8)  T-Culture & Smear AFB [87206/87116-70280] 9)  T- * Misc. Laboratory test [phone]

## 2010-09-14 ENCOUNTER — Ambulatory Visit: Payer: Self-pay | Admitting: Adult Health

## 2010-09-14 ENCOUNTER — Telehealth: Payer: Self-pay | Admitting: *Deleted

## 2010-09-14 ENCOUNTER — Encounter: Payer: Self-pay | Admitting: Adult Health

## 2010-09-15 LAB — DIFFERENTIAL
Band Neutrophils: 0 % (ref 0–10)
Basophils Absolute: 0 10*3/uL (ref 0.0–0.1)
Basophils Relative: 0 % (ref 0–1)
Blasts: 0 %
Eosinophils Absolute: 1.2 10*3/uL — ABNORMAL HIGH (ref 0.0–0.7)
Eosinophils Relative: 34 % — ABNORMAL HIGH (ref 0–5)
Lymphocytes Relative: 13 % (ref 12–46)
Lymphs Abs: 0.4 10*3/uL — ABNORMAL LOW (ref 0.7–4.0)
Metamyelocytes Relative: 0 %
Monocytes Absolute: 0.2 10*3/uL (ref 0.1–1.0)
Monocytes Relative: 5 % (ref 3–12)
Myelocytes: 0 %
Neutro Abs: 1.6 10*3/uL — ABNORMAL LOW (ref 1.7–7.7)
Neutrophils Relative %: 48 % (ref 43–77)
Promyelocytes Absolute: 0 %
nRBC: 0 /100 WBC

## 2010-09-15 LAB — CBC
HCT: 32.1 % — ABNORMAL LOW (ref 39.0–52.0)
Hemoglobin: 10.8 g/dL — ABNORMAL LOW (ref 13.0–17.0)
MCH: 31.9 pg (ref 26.0–34.0)
MCHC: 33.5 g/dL (ref 30.0–36.0)
MCV: 95.2 fL (ref 78.0–100.0)
Platelets: 95 10*3/uL — ABNORMAL LOW (ref 150–400)
RBC: 3.38 MIL/uL — ABNORMAL LOW (ref 4.22–5.81)
RDW: 14.8 % (ref 11.5–15.5)
WBC: 3.4 10*3/uL — ABNORMAL LOW (ref 4.0–10.5)

## 2010-09-15 LAB — BASIC METABOLIC PANEL
BUN: 12 mg/dL (ref 6–23)
CO2: 27 mEq/L (ref 19–32)
Calcium: 8.4 mg/dL (ref 8.4–10.5)
Chloride: 109 mEq/L (ref 96–112)
Creatinine, Ser: 1.09 mg/dL (ref 0.4–1.5)
GFR calc Af Amer: >60 mL/min (ref >60)
GFR calc non Af Amer: >60 mL/min (ref >60)
Glucose, Bld: 97 mg/dL (ref 70–99)
Potassium: 4 mEq/L (ref 3.5–5.1)
Sodium: 139 mEq/L (ref 135–145)

## 2010-09-16 ENCOUNTER — Encounter (INDEPENDENT_AMBULATORY_CARE_PROVIDER_SITE_OTHER): Payer: Self-pay | Admitting: *Deleted

## 2010-09-18 LAB — T-HELPER CELL (CD4) - (RCID CLINIC ONLY)
CD4 % Helper T Cell: <1 % — ABNORMAL LOW (ref 33–55)
CD4 T Cell Abs: <10 uL — ABNORMAL LOW (ref 400–2700)

## 2010-09-19 ENCOUNTER — Encounter: Payer: Self-pay | Admitting: Adult Health

## 2010-09-19 ENCOUNTER — Encounter (HOSPITAL_COMMUNITY): Payer: Self-pay | Attending: General Surgery

## 2010-09-19 ENCOUNTER — Other Ambulatory Visit: Payer: Self-pay | Admitting: General Surgery

## 2010-09-19 ENCOUNTER — Ambulatory Visit: Payer: Self-pay | Admitting: Adult Health

## 2010-09-19 ENCOUNTER — Telehealth: Payer: Self-pay | Admitting: *Deleted

## 2010-09-19 DIAGNOSIS — Z01812 Encounter for preprocedural laboratory examination: Secondary | ICD-10-CM | POA: Insufficient documentation

## 2010-09-19 LAB — DIFFERENTIAL
Band Neutrophils: 0 % (ref 0–10)
Basophils Absolute: 0 10*3/uL (ref 0.0–0.1)
Basophils Relative: 0 % (ref 0–1)
Blasts: 0 %
Eosinophils Absolute: 1.8 10*3/uL — ABNORMAL HIGH (ref 0.0–0.7)
Eosinophils Relative: 26 % — ABNORMAL HIGH (ref 0–5)
Lymphocytes Relative: 27 % (ref 12–46)
Lymphs Abs: 2 10*3/uL (ref 0.7–4.0)
Metamyelocytes Relative: 0 %
Monocytes Absolute: 1.9 10*3/uL — ABNORMAL HIGH (ref 0.1–1.0)
Monocytes Relative: 27 % — ABNORMAL HIGH (ref 3–12)
Myelocytes: 0 %
Neutro Abs: 1.4 10*3/uL — ABNORMAL LOW (ref 1.7–7.7)
Neutrophils Relative %: 20 % — ABNORMAL LOW (ref 43–77)
Promyelocytes Absolute: 0 %
nRBC: 0 /100 WBC

## 2010-09-19 LAB — COMPREHENSIVE METABOLIC PANEL
ALT: 13 U/L (ref 0–53)
AST: 22 U/L (ref 0–37)
Albumin: 3 g/dL — ABNORMAL LOW (ref 3.5–5.2)
Alkaline Phosphatase: 60 U/L (ref 39–117)
BUN: 14 mg/dL (ref 6–23)
CO2: 26 mEq/L (ref 19–32)
Calcium: 9.1 mg/dL (ref 8.4–10.5)
Chloride: 105 mEq/L (ref 96–112)
Creatinine, Ser: 1.01 mg/dL (ref 0.4–1.5)
GFR calc Af Amer: >60 mL/min (ref >60)
GFR calc non Af Amer: >60 mL/min (ref >60)
Glucose, Bld: 90 mg/dL (ref 70–99)
Potassium: 4.4 mEq/L (ref 3.5–5.1)
Sodium: 137 mEq/L (ref 135–145)
Total Bilirubin: 0.4 mg/dL (ref 0.3–1.2)
Total Protein: 7.2 g/dL (ref 6.0–8.3)

## 2010-09-19 LAB — CBC
HCT: 31.2 % — ABNORMAL LOW (ref 39.0–52.0)
Hemoglobin: 9.9 g/dL — ABNORMAL LOW (ref 13.0–17.0)
MCH: 28.6 pg (ref 26.0–34.0)
MCHC: 31.7 g/dL (ref 30.0–36.0)
MCV: 90.2 fL (ref 78.0–100.0)
Platelets: 288 10*3/uL (ref 150–400)
RBC: 3.46 MIL/uL — ABNORMAL LOW (ref 4.22–5.81)
RDW: 15.8 % — ABNORMAL HIGH (ref 11.5–15.5)
WBC: 7.1 10*3/uL (ref 4.0–10.5)

## 2010-09-19 LAB — SURGICAL PCR SCREEN
MRSA, PCR: NEGATIVE
Staphylococcus aureus: NEGATIVE

## 2010-09-20 NOTE — Letter (Signed)
Summary: IDD   IDD   Imported By: Florinda Marker 09/14/2010 15:05:38  _____________________________________________________________________  External Attachment:    Type:   Image     Comment:   External Document

## 2010-09-20 NOTE — Progress Notes (Signed)
Summary: [facility] Ref[facility]  Phone Note Outgoing Call   Call placed by: Annice Pih Summary of Call: referral made to [facility] Initial call taken by: Wendall Mola CMA Duncan Dull),  September 14, 2010 4:15 PM

## 2010-09-20 NOTE — Miscellaneous (Signed)
Summary: [facility] update   Clinical Lists Changes  Observations: Added new observation of PAYOR: No Insurance (09/16/2010 14:16) Added new observation of AIDSDAP: Pending-approval 2012 (09/16/2010 14:16) Added new observation of PCTFPL: 19.39  (09/16/2010 14:16) Added new observation of INCOMESOURCE: wages  (09/16/2010 14:16) Added new observation of HOUSEINCOME: 2100  (09/16/2010 14:16) Added new observation of #CHILD<18 IN: No  (09/16/2010 14:16) Added new observation of FAMILYSIZE: 1  (09/16/2010 14:16) Added new observation of HOUSING: Stable/permanent  (09/16/2010 14:16) Added new observation of YEARLYEXPEN: 0  (09/16/2010 14:16) Added new observation of FINASSESSDT: 09/15/2010  (09/16/2010 14:16)

## 2010-09-21 ENCOUNTER — Ambulatory Visit (HOSPITAL_COMMUNITY)
Admission: RE | Admit: 2010-09-21 | Discharge: 2010-09-21 | Disposition: A | Discharge status: Home / Self Care | Payer: Self-pay | Source: Ambulatory Visit | Attending: General Surgery | Admitting: General Surgery

## 2010-09-21 ENCOUNTER — Other Ambulatory Visit: Payer: Self-pay | Admitting: General Surgery

## 2010-09-21 DIAGNOSIS — Z21 Asymptomatic human immunodeficiency virus [HIV] infection status: Secondary | ICD-10-CM | POA: Insufficient documentation

## 2010-09-21 DIAGNOSIS — N6089 Other benign mammary dysplasias of unspecified breast: Secondary | ICD-10-CM | POA: Insufficient documentation

## 2010-09-21 DIAGNOSIS — Z79899 Other long term (current) drug therapy: Secondary | ICD-10-CM | POA: Insufficient documentation

## 2010-09-21 DIAGNOSIS — N61 Mastitis without abscess: Secondary | ICD-10-CM | POA: Insufficient documentation

## 2010-09-21 HISTORY — PX: OTHER SURGICAL HISTORY: SHX169

## 2010-09-22 NOTE — Op Note (Signed)
NAME:  Casey Brennan, Casey Brennan NO.:  [id number]  MEDICAL RECORD NO.:  [id number]           PATIENT TYPE:  O  LOCATION:  DAYL                         FACILITY:  [facility]  PHYSICIAN:  Juanetta Gosling, MDDATE OF BIRTH:  1961/06/21  DATE OF PROCEDURE: DATE OF DISCHARGE:                              OPERATIVE REPORT   PREOPERATIVE DIAGNOSIS:  Chronic left breast abscess.  POSTOPERATIVE DIAGNOSIS:  Chronic left breast abscess.  PROCEDURE:  Excision of chronic left breast abscess.  SURGEON:  Juanetta Gosling, MD  ASSISTANT:  None.  ANESTHESIA:  General.  FINDINGS:  A very chronic left breast abscess cavity with opening to the areola with no active infection in place today and left a wick in place underneath the skin to allow this area to drain.  SPECIMENS:  Left breast mass to pathology.  ESTIMATED BLOOD LOSS:  Minimal.  COMPLICATIONS:  None.  DRAINS:  Wick left in place.  DISPOSITION:  To recovery room in stable condition.  INDICATIONS:  Casey Brennan is a 49-year male whom we saw on December 2010 for recurrent left breast abscess.  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  He has had a number of medical problems since then.  This area under his left breast has still persisted, has had drainage from this area and is still tender.  He underwent an ultrasound showing a 2 x 1.2 x 2.1 cm lesion, represented a complicated cyst or an abscess.  On his exam, he appeared to still have a very chronic abscess with draining areas on his areola.  He and I discussed surgical excision of this entire mass as well as possible removal of his nipple if necessary.  PROCEDURE:  After informed consent was obtained, the patient was taken to the operating room.  He was administered 1 gm of intravenous vancomycin due to his penicillin allergy.  He had sequential compression devices  placed on lower extremities.  He was then placed under general anesthesia without complication.  His left nipple and breast were then prepped and draped in standard sterile surgical fashion.  Surgical time- out was then performed.  I then made a periareolar incision around the top half of his nipple and I shaved this area off his nipple.  The mass was excised in total all the way down to his pectoralis fascia and including the pectoralis fascia.  The mass was excised completely.  The nipple appeared to be otherwise okay and it was very friable.  There was a pinhole on his areola that I debrided as well but I think that his nipple is going to survive, removing all of this disease.  I am hopeful that this will take care of the entire problem, as he very much wanted to leave his nipple in place.  This was irrigated.  Hemostasis was obtained, sent this off as a specimen.  I then closed the deep tissue with a 3-0 Vicryl to approximate the skin.  I placed one-quarter iodoform wick  involved in this as well and then I steri-stripped his skin.  He tolerated this well, had a sterile dressing in place and was transferred to recovery room in stable condition.  I am going to have him come back on Friday to remove this wick.     Juanetta Gosling, MD     MCW/MEDQ  D:  09/21/2010  T:  09/21/2010  Job:  [id number]  cc:   Talmadge Chad, NP Fax: [phone]  Electronically Signed by Emelia Loron MD on 09/22/2010 11:16:25 AM

## 2010-09-23 ENCOUNTER — Telehealth: Payer: Self-pay | Admitting: *Deleted

## 2010-09-23 LAB — CULTURE, ROUTINE-ABSCESS: Gram Stain: NONE SEEN

## 2010-09-23 NOTE — Telephone Encounter (Signed)
Called patient to inform that Unemployment form is done and ready to be faxed to [place]. Patient adv he would come by on Friday and pick up a copy of the form for his records. Told him it will be here and ready.

## 2010-09-26 LAB — FUNGUS CULTURE W SMEAR: Fungal Smear: NONE SEEN

## 2010-09-26 LAB — ANAEROBIC CULTURE: Gram Stain: NONE SEEN

## 2010-09-28 ENCOUNTER — Encounter (INDEPENDENT_AMBULATORY_CARE_PROVIDER_SITE_OTHER): Payer: Self-pay | Admitting: *Deleted

## 2010-09-29 NOTE — Assessment & Plan Note (Signed)
Summary: 2WK F/U/VS   Vital Signs:  Patient profile:   50 year old male Height:      69 inches Weight:      184.8 pounds BMI:     27.39 Temp:     98.2 degrees F oral Pulse rate:   89 / minute BP sitting:   121 / 76  (right arm)  Vitals Entered By: Alesia Morin CMA (September 19, 2010 9:51 AM) CC: follow-up visit for surgery on Wednesday 3/21 Is Patient Diabetic? No Pain Assessment Patient in pain? no      Nutritional Status BMI of 25 - 29 = overweight Nutritional Status Detail appetite "great"  Have you ever been in a relationship where you felt threatened, hurt or afraid?No   Does patient need assistance? Functional Status Self care Ambulation Normal Comments no missed meds   CC:  follow-up visit for surgery on Wednesday 3/21.  History of Present Illness: Feeling much better, rash and fever abated.  Follicular rash slowly improving.  Cough cleared.  Preventive Screening-Counseling & Management  Alcohol-Tobacco     Alcohol drinks/day: 0     Smoking Status: quit < 6 months     Cigars/week: 3 packs of cigars/wk  Caffeine-Diet-Exercise     Caffeine use/day: yes     Does Patient Exercise: no     Type of exercise: weight lifting     Exercise (avg: min/session): <30     Times/week: <3  Hep-HIV-STD-Contraception     HIV Risk: no risk noted  Safety-Violence-Falls     Seat Belt Use: yes      Sexual History:  n/a.        Drug Use:  never.        Blood Transfusions:  no.        Travel History:  no.    Allergies: 1)  ! Pcn 2)  ! Sulfa  Review of Systems       The patient complains of headaches.  The patient denies anorexia, fever, weight loss, weight gain, vision loss, decreased hearing, hoarseness, chest pain, syncope, dyspnea on exertion, peripheral edema, prolonged cough, hemoptysis, abdominal pain, melena, hematochezia, severe indigestion/heartburn, hematuria, incontinence, genital sores, muscle weakness, suspicious skin lesions, transient blindness,  difficulty walking, depression, unusual weight change, abnormal bleeding, enlarged lymph nodes, angioedema, breast masses, and testicular masses.    Physical Exam  General:  alert, well-developed, well-nourished, and well-hydrated.   Head:  normocephalic and atraumatic.   Eyes:  vision grossly intact, pupils equal, pupils round, and pupils reactive to light.   Ears:  R ear normal and L ear normal.   Mouth:  pharynx pink and moist, no lesions, and fair dentition.   Lungs:  normal respiratory effort, no intercostal retractions, and normal breath sounds.   Heart:  normal rate, regular rhythm, no murmur, no gallop, and no rub.   Skin:  Follicular rash improving.  No urticaric rash noted Psych:  Oriented X3, memory intact for recent and remote, normally interactive, good eye contact, more expressive today.     Impression & Recommendations:  Problem # 1:  DERMATITIS DUE DRUGS&MEDICINES TAKEN INTERNALLY (ICD-693.0) Assessment Improved Most likely sulfa-related.  Completed Medrol dosepak.  will continue to monitor  Problem # 2:  ABSCESS, BREAST, LEFT, CHRONIC (ICD-611.0) To have surgery on 09/21/2010.  Cleared for surgery from our perspective.  Problem # 3:  HIV INFECTION (ICD-042) CPM, RTC 2 weeks post-op,  will re-stage at that point. His updated medication list for this problem includes:  Azithromycin 600 Mg Tabs (Azithromycin) .[name]... Take 2 tablets by mouth once a week    Augmentin 500-125 Mg Tabs (Amoxicillin-pot clavulanate) .[name]... Take 1 tablet by mouth once a day    Doxycycline Hyclate 100 Mg Caps (Doxycycline hyclate) .[name]... 1 cap by mouth q12h x 2 weeks    Cipro 500 Mg Tabs (Ciprofloxacin hcl) .[name]... 1 tablet by mouth q12h x 10 days     Prevention & Chronic Care Immunizations   Influenza vaccine: Fluvax Non-MCR  (07/25/2010)    Tetanus booster: Not documented    Pneumococcal vaccine: Historical  (03/22/2009)  Other Screening   PSA: Not documented   Smoking status: quit  < 6 months  (09/19/2010)  Lipids   Total Cholesterol: 138  (07/25/2010)   LDL: 56  (07/25/2010)   LDL Direct: Not documented   HDL: 31  (07/25/2010)   Triglycerides: 253  (07/25/2010)

## 2010-09-29 NOTE — Assessment & Plan Note (Signed)
Summary: COUGH/VS   Vital Signs:  Patient profile:   50 year old male Height:      69 inches (175.26 cm) Weight:      179.12 pounds (81.42 kg) BMI:     26.55 Temp:     98.3 degrees F (36.83 degrees C) oral Pulse rate:   101 / minute BP sitting:   123 / 82  (left arm)  Vitals Entered By: Wendall Mola CMA Duncan Dull) (September 14, 2010 3:36 PM)  CC: Casey Brennan. c/o rash and SHOB and fever, nasal drainage Is Patient Diabetic? No Pain Assessment Patient in pain? no      Nutritional Status BMI of 25 - 29 = overweight Nutritional Status Detail appetite "good"  Have you ever been in a relationship where you felt threatened, hurt or afraid?Unable to ask   Does patient need assistance? Functional Status Self care Ambulation Normal Comments no missed doses of meds per Casey Brennan.   CC:  Casey Brennan. c/o rash and SHOB and fever and nasal drainage.  History of Present Illness: Fever and welt-like rash on back of neck every evening at bedtime.  Follicular rash unchanged.  Preventive Screening-Counseling & Management  Alcohol-Tobacco     Alcohol drinks/day: 0     Smoking Status: quit < 6 months     Cigars/week: cigars  Caffeine-Diet-Exercise     Caffeine use/day: yes     Does Patient Exercise: no     Type of exercise: weight lifting     Exercise (avg: min/session): <30     Times/week: <3  Hep-HIV-STD-Contraception     HIV Risk: no risk noted  Safety-Violence-Falls     Seat Belt Use: yes      Sexual History:  n/a.        Drug Use:  never.        Blood Transfusions:  no.        Travel History:  no.    Comments: Casey Brennan. declined condoms  Allergies: 1)  ! Pcn 2)  ! Sulfa  Review of Systems       The patient complains of fever, peripheral edema, prolonged cough, headaches, suspicious skin lesions, depression, and breast masses.  The patient denies anorexia, weight loss, weight gain, vision loss, decreased hearing, hoarseness, chest pain, syncope, dyspnea on exertion, hemoptysis, abdominal  pain, melena, hematochezia, severe indigestion/heartburn, hematuria, incontinence, genital sores, muscle weakness, transient blindness, difficulty walking, unusual weight change, abnormal bleeding, enlarged lymph nodes, angioedema, and testicular masses.    Physical Exam  General:  alert, well-developed, well-nourished, and well-hydrated.   Head:  normocephalic, atraumatic, scaling, excoriation, and scalp lesions.  normocephalic, atraumatic, scaling, excoriation, and scalp lesions.   Eyes:  vision grossly intact, pupils equal, pupils round, and pupils reactive to light.  vision grossly intact, pupils equal, pupils round, and pupils reactive to light.   Ears:  R ear normal and L ear normal.  R ear normal and L ear normal.   Nose:  no external deformity and no nasal discharge.  no external deformity and no nasal discharge.   Mouth:  pharynx pink and moist, poor dentition, and teeth missing.  pharynx pink and moist, poor dentition, and teeth missing.   Neck:  supple, full ROM, and no masses.   Lungs:  normal respiratory effort, no intercostal retractions, no accessory muscle use, and normal breath sounds.   Heart:  normal rate, regular rhythm, no murmur, no gallop, and no rub.   Abdomen:  soft, non-tender, and normal bowel sounds.  Msk:  No deformity or scoliosis noted of thoracic or lumbar spine.   Extremities:  No clubbing, cyanosis, edema, or deformity noted with normal full range of motion of all joints.   Neurologic:  alert & oriented X3, cranial nerves II-XII intact, strength normal in all extremities, and gait normal.   Skin:  hyperpigmented circular patches with open shallow excoriations diffusely distributed over neck, back,chest, abd, arms and legs.  Open excoritions of interdigit spacing between toes of feet are healing well. Psych:  Oriented X3, memory intact for recent and remote, normally interactive, good eye contact, and subdued.     Impression & Recommendations:  Problem # 1:   DERMATITIS DUE DRUGS&MEDICINES TAKEN INTERNALLY (ICD-693.0) along with patterned fever, most likely Sulfa reaction.  Will d/c Bactrim and start dapsone for PJP prophylaxis.  Add medrol dosepak and diphenhydramine 25mg  q6h.   Problem # 2:  FOLLICULITIS (ICD-704.8) Relatively unchanged.  will add cipro 500 mg q12h to regimen and re-evaluate on next visit.  Rtc for re-eval on Monday 09/19/2010 as previously scheduled.  Medications Added to Medication List This Visit: 1)  Dapsone 100 Mg Tabs (Dapsone) .... Take one (1) tablet every day 2)  Cipro 500 Mg Tabs (Ciprofloxacin hcl) .[name].. 1 tablet by mouth q12h x 10 days 3)  Medrol (pak) 4 Mg Tabs (Methylprednisolone) .... Use as direcd 4)  Ventolin Hfa 108 (90 Base) Mcg/act Aers (Albuterol sulfate) .[name].. 1-2 inhalations q4h as needed wheezing. Prescriptions: VENTOLIN HFA 108 (90 BASE) MCG/ACT AERS (ALBUTEROL SULFATE) 1-2 inhalations q4h as needed wheezing.  #18g x 0   Entered and Authorized by:   Talmadge Chad NP   Signed by:   Talmadge Chad NP on 09/14/2010   Method used:   Print then Give to Patient   RxID:   [id number] MEDROL (PAK) 4 MG TABS (METHYLPREDNISOLONE) Use as direcd  #1 x 0   Entered and Authorized by:   Talmadge Chad NP   Signed by:   Talmadge Chad NP on 09/14/2010   Method used:   Print then Give to Patient   RxID:   [id number] CIPRO 500 MG TABS (CIPROFLOXACIN HCL) 1 tablet by mouth q12h x 10 days  #20 x 0   Entered and Authorized by:   Talmadge Chad NP   Signed by:   Talmadge Chad NP on 09/14/2010   Method used:   Print then Give to Patient   RxID:   [id number]

## 2010-09-29 NOTE — Progress Notes (Signed)
  Phone Note From Pharmacy   Summary of Call: Pharmacy called to ask about dosing for patients Medrol. Alesia Morin CMA  September 19, 2010 12:01 PM Initial call taken by: Alesia Morin CMA,  September 19, 2010 12:01 PM

## 2010-10-03 ENCOUNTER — Encounter: Payer: Self-pay | Admitting: Adult Health

## 2010-10-03 ENCOUNTER — Ambulatory Visit (INDEPENDENT_AMBULATORY_CARE_PROVIDER_SITE_OTHER): Payer: Self-pay | Admitting: Adult Health

## 2010-10-03 ENCOUNTER — Other Ambulatory Visit: Payer: Self-pay | Admitting: *Deleted

## 2010-10-03 VITALS — BP 117/77 | HR 83 | Temp 98.4°F | Ht 69.0 in | Wt 186.0 lb

## 2010-10-03 DIAGNOSIS — L738 Other specified follicular disorders: Secondary | ICD-10-CM

## 2010-10-03 DIAGNOSIS — J309 Allergic rhinitis, unspecified: Secondary | ICD-10-CM

## 2010-10-03 DIAGNOSIS — D7218 Eosinophilia in diseases classified elsewhere: Secondary | ICD-10-CM

## 2010-10-03 DIAGNOSIS — B20 Human immunodeficiency virus [HIV] disease: Secondary | ICD-10-CM

## 2010-10-03 DIAGNOSIS — L678 Other hair color and hair shaft abnormalities: Secondary | ICD-10-CM

## 2010-10-03 LAB — COMPREHENSIVE METABOLIC PANEL
ALT: 13 U/L (ref 0–53)
AST: 15 U/L (ref 0–37)
Albumin: 3.8 g/dL (ref 3.5–5.2)
Alkaline Phosphatase: 77 U/L (ref 39–117)
BUN: 17 mg/dL (ref 6–23)
CO2: 27 mEq/L (ref 19–32)
Calcium: 9.5 mg/dL (ref 8.4–10.5)
Chloride: 103 mEq/L (ref 96–112)
Creat: 1.21 mg/dL (ref 0.40–1.50)
Glucose, Bld: 98 mg/dL (ref 70–99)
Potassium: 4.1 mEq/L (ref 3.5–5.3)
Sodium: 140 mEq/L (ref 135–145)
Total Bilirubin: 0.5 mg/dL (ref 0.3–1.2)
Total Protein: 7.6 g/dL (ref 6.0–8.3)

## 2010-10-03 MED ORDER — CIPROFLOXACIN HCL 500 MG PO TABS: ORAL_TABLET | ORAL | Status: DC

## 2010-10-03 MED ORDER — CIPROFLOXACIN HCL 500 MG PO TABS: 500.0000 mg | ORAL_TABLET | Freq: Two times a day (BID) | ORAL | Status: DC

## 2010-10-04 LAB — CBC WITH DIFFERENTIAL/PLATELET
Basophils Absolute: 0.1 10*3/uL (ref 0.0–0.1)
Basophils Relative: 1 % (ref 0–1)
Eosinophils Absolute: 1.9 10*3/uL — ABNORMAL HIGH (ref 0.0–0.7)
Eosinophils Relative: 31 % — ABNORMAL HIGH (ref 0–5)
HCT: 32.5 % — ABNORMAL LOW (ref 39.0–52.0)
Hemoglobin: 10.5 g/dL — ABNORMAL LOW (ref 13.0–17.0)
Lymphocytes Relative: 35 % (ref 12–46)
Lymphs Abs: 2.2 10*3/uL (ref 0.7–4.0)
MCH: 29.2 pg (ref 26.0–34.0)
MCHC: 32.3 g/dL (ref 30.0–36.0)
MCV: 90.5 fL (ref 78.0–100.0)
Monocytes Absolute: 0.6 10*3/uL (ref 0.1–1.0)
Monocytes Relative: 9 % (ref 3–12)
Neutro Abs: 1.5 10*3/uL — ABNORMAL LOW (ref 1.7–7.7)
Neutrophils Relative %: 24 % — ABNORMAL LOW (ref 43–77)
Platelets: 304 10*3/uL (ref 150–400)
RBC: 3.59 MIL/uL — ABNORMAL LOW (ref 4.22–5.81)
RDW: 15.6 % — ABNORMAL HIGH (ref 11.5–15.5)
WBC: 6.3 10*3/uL (ref 4.0–10.5)

## 2010-10-04 LAB — T-HELPER CELL (CD4) - (RCID CLINIC ONLY)
CD4 % Helper T Cell: 2 % — ABNORMAL LOW (ref 33–55)
CD4 % Helper T Cell: <1 % — ABNORMAL LOW (ref 33–55)
CD4 T Cell Abs: 50 uL — ABNORMAL LOW (ref 400–2700)
CD4 T Cell Abs: <10 uL — ABNORMAL LOW (ref 400–2700)

## 2010-10-04 NOTE — Miscellaneous (Signed)
  Clinical Lists Changes 

## 2010-10-05 ENCOUNTER — Ambulatory Visit: Payer: Self-pay

## 2010-10-05 LAB — HIV-1 RNA ULTRAQUANT REFLEX TO GENTYP+
HIV 1 RNA Quant: 601 copies/mL — ABNORMAL HIGH (ref <20)
HIV-1 RNA Quant, Log: 2.78 {Log} — ABNORMAL HIGH (ref <1.30)

## 2010-10-07 LAB — HEPATIC FUNCTION PANEL
ALT: 11 U/L (ref 0–53)
AST: 23 U/L (ref 0–37)
Albumin: 3.1 g/dL — ABNORMAL LOW (ref 3.5–5.2)
Alkaline Phosphatase: 50 U/L (ref 39–117)
Bilirubin, Direct: 0.8 mg/dL — ABNORMAL HIGH (ref 0.0–0.3)
Indirect Bilirubin: 1.5 mg/dL — ABNORMAL HIGH (ref 0.3–0.9)
Total Bilirubin: 2.3 mg/dL — ABNORMAL HIGH (ref 0.3–1.2)
Total Protein: 6.7 g/dL (ref 6.0–8.3)

## 2010-10-07 LAB — BASIC METABOLIC PANEL
BUN: 14 mg/dL (ref 6–23)
BUN: 18 mg/dL (ref 6–23)
BUN: 5 mg/dL — ABNORMAL LOW (ref 6–23)
BUN: 7 mg/dL (ref 6–23)
BUN: 9 mg/dL (ref 6–23)
CO2: 23 mEq/L (ref 19–32)
CO2: 23 mEq/L (ref 19–32)
CO2: 24 mEq/L (ref 19–32)
CO2: 26 mEq/L (ref 19–32)
CO2: 26 mEq/L (ref 19–32)
Calcium: 7.7 mg/dL — ABNORMAL LOW (ref 8.4–10.5)
Calcium: 8 mg/dL — ABNORMAL LOW (ref 8.4–10.5)
Calcium: 8.2 mg/dL — ABNORMAL LOW (ref 8.4–10.5)
Calcium: 8.4 mg/dL (ref 8.4–10.5)
Calcium: 8.5 mg/dL (ref 8.4–10.5)
Chloride: 105 mEq/L (ref 96–112)
Chloride: 108 mEq/L (ref 96–112)
Chloride: 110 mEq/L (ref 96–112)
Chloride: 111 mEq/L (ref 96–112)
Chloride: 99 mEq/L (ref 96–112)
Creatinine, Ser: 1.07 mg/dL (ref 0.4–1.5)
Creatinine, Ser: 1.14 mg/dL (ref 0.4–1.5)
Creatinine, Ser: 1.2 mg/dL (ref 0.4–1.5)
Creatinine, Ser: 1.24 mg/dL (ref 0.4–1.5)
Creatinine, Ser: 1.81 mg/dL — ABNORMAL HIGH (ref 0.4–1.5)
GFR calc Af Amer: 49 mL/min — ABNORMAL LOW (ref >60)
GFR calc Af Amer: >60 mL/min (ref >60)
GFR calc Af Amer: >60 mL/min (ref >60)
GFR calc Af Amer: >60 mL/min (ref >60)
GFR calc Af Amer: >60 mL/min (ref >60)
GFR calc non Af Amer: 41 mL/min — ABNORMAL LOW (ref >60)
GFR calc non Af Amer: >60 mL/min (ref >60)
GFR calc non Af Amer: >60 mL/min (ref >60)
GFR calc non Af Amer: >60 mL/min (ref >60)
GFR calc non Af Amer: >60 mL/min (ref >60)
Glucose, Bld: 102 mg/dL — ABNORMAL HIGH (ref 70–99)
Glucose, Bld: 108 mg/dL — ABNORMAL HIGH (ref 70–99)
Glucose, Bld: 93 mg/dL (ref 70–99)
Glucose, Bld: 93 mg/dL (ref 70–99)
Glucose, Bld: 95 mg/dL (ref 70–99)
Potassium: 3.2 mEq/L — ABNORMAL LOW (ref 3.5–5.1)
Potassium: 3.3 mEq/L — ABNORMAL LOW (ref 3.5–5.1)
Potassium: 3.6 mEq/L (ref 3.5–5.1)
Potassium: 3.6 mEq/L (ref 3.5–5.1)
Potassium: 3.9 mEq/L (ref 3.5–5.1)
Sodium: 131 mEq/L — ABNORMAL LOW (ref 135–145)
Sodium: 135 mEq/L (ref 135–145)
Sodium: 137 mEq/L (ref 135–145)
Sodium: 137 mEq/L (ref 135–145)
Sodium: 139 mEq/L (ref 135–145)

## 2010-10-07 LAB — ANAEROBIC CULTURE

## 2010-10-07 LAB — CBC
HCT: 24.1 % — ABNORMAL LOW (ref 39.0–52.0)
HCT: 25 % — ABNORMAL LOW (ref 39.0–52.0)
HCT: 26.6 % — ABNORMAL LOW (ref 39.0–52.0)
HCT: 27 % — ABNORMAL LOW (ref 39.0–52.0)
HCT: 28.3 % — ABNORMAL LOW (ref 39.0–52.0)
HCT: 29.8 % — ABNORMAL LOW (ref 39.0–52.0)
Hemoglobin: 10 g/dL — ABNORMAL LOW (ref 13.0–17.0)
Hemoglobin: 8.1 g/dL — ABNORMAL LOW (ref 13.0–17.0)
Hemoglobin: 8.3 g/dL — ABNORMAL LOW (ref 13.0–17.0)
Hemoglobin: 8.9 g/dL — ABNORMAL LOW (ref 13.0–17.0)
Hemoglobin: 9.1 g/dL — ABNORMAL LOW (ref 13.0–17.0)
Hemoglobin: 9.6 g/dL — ABNORMAL LOW (ref 13.0–17.0)
MCHC: 33 g/dL (ref 30.0–36.0)
MCHC: 33.5 g/dL (ref 30.0–36.0)
MCHC: 33.6 g/dL (ref 30.0–36.0)
MCHC: 33.6 g/dL (ref 30.0–36.0)
MCHC: 33.8 g/dL (ref 30.0–36.0)
MCHC: 34.1 g/dL (ref 30.0–36.0)
MCV: 93.6 fL (ref 78.0–100.0)
MCV: 93.6 fL (ref 78.0–100.0)
MCV: 94.2 fL (ref 78.0–100.0)
MCV: 94.9 fL (ref 78.0–100.0)
MCV: 95.1 fL (ref 78.0–100.0)
MCV: 95.2 fL (ref 78.0–100.0)
Platelets: 118 10*3/uL — ABNORMAL LOW (ref 150–400)
Platelets: 160 10*3/uL (ref 150–400)
Platelets: 182 10*3/uL (ref 150–400)
Platelets: 86 10*3/uL — ABNORMAL LOW (ref 150–400)
Platelets: 99 10*3/uL — ABNORMAL LOW (ref 150–400)
RBC: 2.53 MIL/uL — ABNORMAL LOW (ref 4.22–5.81)
RBC: 2.64 MIL/uL — ABNORMAL LOW (ref 4.22–5.81)
RBC: 2.82 MIL/uL — ABNORMAL LOW (ref 4.22–5.81)
RBC: 2.88 MIL/uL — ABNORMAL LOW (ref 4.22–5.81)
RBC: 3.03 MIL/uL — ABNORMAL LOW (ref 4.22–5.81)
RBC: 3.14 MIL/uL — ABNORMAL LOW (ref 4.22–5.81)
RDW: 14.1 % (ref 11.5–15.5)
RDW: 15 % (ref 11.5–15.5)
RDW: 15.4 % (ref 11.5–15.5)
RDW: 15.5 % (ref 11.5–15.5)
RDW: 15.6 % — ABNORMAL HIGH (ref 11.5–15.5)
RDW: 15.9 % — ABNORMAL HIGH (ref 11.5–15.5)
WBC: 2.2 10*3/uL — ABNORMAL LOW (ref 4.0–10.5)
WBC: 3.3 10*3/uL — ABNORMAL LOW (ref 4.0–10.5)
WBC: 3.4 10*3/uL — ABNORMAL LOW (ref 4.0–10.5)
WBC: 4 10*3/uL (ref 4.0–10.5)
WBC: 4.8 10*3/uL (ref 4.0–10.5)
WBC: 5.3 10*3/uL (ref 4.0–10.5)

## 2010-10-07 LAB — DIFFERENTIAL
Basophils Absolute: 0 10*3/uL (ref 0.0–0.1)
Basophils Absolute: 0 10*3/uL (ref 0.0–0.1)
Basophils Relative: 0 % (ref 0–1)
Basophils Relative: 0 % (ref 0–1)
Eosinophils Absolute: 0 10*3/uL (ref 0.0–0.7)
Eosinophils Absolute: 0.1 10*3/uL (ref 0.0–0.7)
Eosinophils Relative: 0 % (ref 0–5)
Eosinophils Relative: 2 % (ref 0–5)
Lymphocytes Relative: 12 % (ref 12–46)
Lymphocytes Relative: 5 % — ABNORMAL LOW (ref 12–46)
Lymphs Abs: 0.3 10*3/uL — ABNORMAL LOW (ref 0.7–4.0)
Lymphs Abs: 0.6 10*3/uL — ABNORMAL LOW (ref 0.7–4.0)
Monocytes Absolute: 0.1 10*3/uL (ref 0.1–1.0)
Monocytes Absolute: 0.3 10*3/uL (ref 0.1–1.0)
Monocytes Relative: 2 % — ABNORMAL LOW (ref 3–12)
Monocytes Relative: 7 % (ref 3–12)
Neutro Abs: 3.8 10*3/uL (ref 1.7–7.7)
Neutro Abs: 4.9 10*3/uL (ref 1.7–7.7)
Neutrophils Relative %: 79 % — ABNORMAL HIGH (ref 43–77)
Neutrophils Relative %: 93 % — ABNORMAL HIGH (ref 43–77)

## 2010-10-07 LAB — URINALYSIS, ROUTINE W REFLEX MICROSCOPIC
Glucose, UA: NEGATIVE mg/dL
Ketones, ur: NEGATIVE mg/dL
Leukocytes, UA: NEGATIVE
Nitrite: NEGATIVE
Protein, ur: 30 mg/dL — AB
Specific Gravity, Urine: 1.017 (ref 1.005–1.030)
Urobilinogen, UA: 2 mg/dL — ABNORMAL HIGH (ref 0.0–1.0)
pH: 5.5 (ref 5.0–8.0)

## 2010-10-07 LAB — WOUND CULTURE: Gram Stain: NONE SEEN

## 2010-10-07 LAB — CSF CULTURE W GRAM STAIN: Gram Stain: NONE SEEN

## 2010-10-07 LAB — URINE MICROSCOPIC-ADD ON

## 2010-10-07 LAB — URINE CULTURE: Colony Count: 40000

## 2010-10-07 LAB — IRON AND TIBC
Iron: 57 ug/dL (ref 42–135)
Saturation Ratios: 36 % (ref 20–55)
TIBC: 159 ug/dL — ABNORMAL LOW (ref 215–435)
UIBC: 102 ug/dL

## 2010-10-07 LAB — FOLATE: Folate: 3.7 ng/mL

## 2010-10-07 LAB — CARDIAC PANEL(CRET KIN+CKTOT+MB+TROPI)
CK, MB: 1.8 ng/mL (ref 0.3–4.0)
CK, MB: 18.9 ng/mL — ABNORMAL HIGH (ref 0.3–4.0)
CK, MB: 2.5 ng/mL (ref 0.3–4.0)
CK, MB: 42.7 ng/mL — ABNORMAL HIGH (ref 0.3–4.0)
Relative Index: 0.9 (ref 0.0–2.5)
Relative Index: 1.2 (ref 0.0–2.5)
Relative Index: 10.1 — ABNORMAL HIGH (ref 0.0–2.5)
Relative Index: 6.2 — ABNORMAL HIGH (ref 0.0–2.5)
Total CK: 204 U/L (ref 7–232)
Total CK: 209 U/L (ref 7–232)
Total CK: 305 U/L — ABNORMAL HIGH (ref 7–232)
Total CK: 423 U/L — ABNORMAL HIGH (ref 7–232)
Troponin I: 0.01 ng/mL (ref 0.00–0.06)
Troponin I: 1.37 ng/mL (ref 0.00–0.06)
Troponin I: 2.04 ng/mL (ref 0.00–0.06)
Troponin I: <0.01 ng/mL (ref 0.00–0.06)

## 2010-10-07 LAB — COMPREHENSIVE METABOLIC PANEL
ALT: 14 U/L (ref 0–53)
AST: 24 U/L (ref 0–37)
Albumin: 2.3 g/dL — ABNORMAL LOW (ref 3.5–5.2)
Alkaline Phosphatase: 36 U/L — ABNORMAL LOW (ref 39–117)
BUN: 15 mg/dL (ref 6–23)
CO2: 21 mEq/L (ref 19–32)
Calcium: 7.4 mg/dL — ABNORMAL LOW (ref 8.4–10.5)
Chloride: 108 mEq/L (ref 96–112)
Creatinine, Ser: 1.55 mg/dL — ABNORMAL HIGH (ref 0.4–1.5)
GFR calc Af Amer: 59 mL/min — ABNORMAL LOW (ref >60)
GFR calc non Af Amer: 49 mL/min — ABNORMAL LOW (ref >60)
Glucose, Bld: 86 mg/dL (ref 70–99)
Potassium: 4.1 mEq/L (ref 3.5–5.1)
Sodium: 134 mEq/L — ABNORMAL LOW (ref 135–145)
Total Bilirubin: 1.5 mg/dL — ABNORMAL HIGH (ref 0.3–1.2)
Total Protein: 5.6 g/dL — ABNORMAL LOW (ref 6.0–8.3)

## 2010-10-07 LAB — CULTURE, ROUTINE-ABSCESS

## 2010-10-07 LAB — CULTURE, BLOOD (ROUTINE X 2)
Culture: NO GROWTH
Culture: NO GROWTH

## 2010-10-07 LAB — CSF CELL COUNT WITH DIFFERENTIAL
RBC Count, CSF: 2 /mm3 — ABNORMAL HIGH
RBC Count, CSF: 2 /mm3 — ABNORMAL HIGH
Tube #: 2
Tube #: 4
WBC, CSF: 0 /mm3 (ref 0–5)
WBC, CSF: 1 /mm3 (ref 0–5)

## 2010-10-07 LAB — RETICULOCYTES
RBC.: 2.9 MIL/uL — ABNORMAL LOW (ref 4.22–5.81)
Retic Count, Absolute: 14.5 10*3/uL — ABNORMAL LOW (ref 19.0–186.0)
Retic Ct Pct: 0.5 % (ref 0.4–3.1)

## 2010-10-07 LAB — AFB CULTURE, BLOOD

## 2010-10-07 LAB — HSV PCR
HSV 2 , PCR: NOT DETECTED
HSV, PCR: NOT DETECTED

## 2010-10-07 LAB — VANCOMYCIN, TROUGH: Vancomycin Tr: 12.6 ug/mL (ref 10.0–20.0)

## 2010-10-07 LAB — HIV-1 RNA QUANT-NO REFLEX-BLD
HIV 1 RNA Quant: 57300 copies/mL — ABNORMAL HIGH (ref <48)
HIV-1 RNA Quant, Log: 4.76 {Log} — ABNORMAL HIGH (ref <1.68)

## 2010-10-07 LAB — PROTEIN, CSF: Total  Protein, CSF: 37 mg/dL (ref 15–45)

## 2010-10-07 LAB — T-HELPER CELLS (CD4) COUNT (NOT AT ARMC)
CD4 % Helper T Cell: 1 % — ABNORMAL LOW (ref 33–55)
CD4 T Cell Abs: <10 uL — ABNORMAL LOW (ref 400–2700)

## 2010-10-07 LAB — GLUCOSE, CSF: Glucose, CSF: 65 mg/dL (ref 43–76)

## 2010-10-07 LAB — VDRL, CSF: VDRL Quant, CSF: NONREACTIVE

## 2010-10-07 LAB — MAGNESIUM: Magnesium: 1.8 mg/dL (ref 1.5–2.5)

## 2010-10-07 LAB — CREATININE, URINE, RANDOM: Creatinine, Urine: 75 mg/dL

## 2010-10-07 LAB — METHYLMALONIC ACID, SERUM: Methylmalonic Acid, Quantitative: 212 nmol/L (ref 87–318)

## 2010-10-07 LAB — VITAMIN B12: Vitamin B-12: 270 pg/mL (ref 211–911)

## 2010-10-07 LAB — FERRITIN: Ferritin: 532 ng/mL — ABNORMAL HIGH (ref 22–322)

## 2010-10-07 LAB — LACTIC ACID, PLASMA: Lactic Acid, Venous: 1.6 mmol/L (ref 0.5–2.2)

## 2010-10-07 LAB — GLUCOSE, CAPILLARY: Glucose-Capillary: 83 mg/dL (ref 70–99)

## 2010-10-07 LAB — SODIUM, URINE, RANDOM: Sodium, Ur: 70 mEq/L

## 2010-10-07 LAB — CSF CULTURE: Culture: NO GROWTH

## 2010-10-07 LAB — CRYPTOCOCCAL ANTIGEN, CSF: Crypto Ag: NEGATIVE

## 2010-10-07 LAB — CK: Total CK: 203 U/L (ref 7–232)

## 2010-10-09 ENCOUNTER — Emergency Department (HOSPITAL_COMMUNITY): Payer: Self-pay

## 2010-10-09 ENCOUNTER — Emergency Department (HOSPITAL_COMMUNITY)
Admission: EM | Admit: 2010-10-09 | Discharge: 2010-10-10 | Disposition: A | Discharge status: Other Acute Inpatient Hospital | Payer: Self-pay | Attending: Internal Medicine | Admitting: Internal Medicine

## 2010-10-09 DIAGNOSIS — N61 Mastitis without abscess: Secondary | ICD-10-CM | POA: Insufficient documentation

## 2010-10-09 DIAGNOSIS — R42 Dizziness and giddiness: Secondary | ICD-10-CM | POA: Insufficient documentation

## 2010-10-09 DIAGNOSIS — J0130 Acute sphenoidal sinusitis, unspecified: Secondary | ICD-10-CM | POA: Insufficient documentation

## 2010-10-09 DIAGNOSIS — D649 Anemia, unspecified: Secondary | ICD-10-CM | POA: Insufficient documentation

## 2010-10-09 DIAGNOSIS — D721 Eosinophilia, unspecified: Secondary | ICD-10-CM | POA: Insufficient documentation

## 2010-10-09 DIAGNOSIS — R51 Headache: Secondary | ICD-10-CM | POA: Insufficient documentation

## 2010-10-09 DIAGNOSIS — Z21 Asymptomatic human immunodeficiency virus [HIV] infection status: Secondary | ICD-10-CM | POA: Insufficient documentation

## 2010-10-09 DIAGNOSIS — J322 Chronic ethmoidal sinusitis: Secondary | ICD-10-CM | POA: Insufficient documentation

## 2010-10-09 LAB — DIFFERENTIAL
Basophils Absolute: 0.1 10*3/uL (ref 0.0–0.1)
Basophils Relative: 1 % (ref 0–1)
Eosinophils Absolute: 2 10*3/uL — ABNORMAL HIGH (ref 0.0–0.7)
Eosinophils Relative: 28 % — ABNORMAL HIGH (ref 0–5)
Lymphocytes Relative: 39 % (ref 12–46)
Lymphs Abs: 2.7 10*3/uL (ref 0.7–4.0)
Monocytes Absolute: 0.9 10*3/uL (ref 0.1–1.0)
Monocytes Relative: 12 % (ref 3–12)
Neutro Abs: 1.4 10*3/uL — ABNORMAL LOW (ref 1.7–7.7)
Neutrophils Relative %: 20 % — ABNORMAL LOW (ref 43–77)

## 2010-10-09 LAB — CBC
HCT: 31 % — ABNORMAL LOW (ref 39.0–52.0)
Hemoglobin: 9.8 g/dL — ABNORMAL LOW (ref 13.0–17.0)
MCH: 28.5 pg (ref 26.0–34.0)
MCHC: 31.6 g/dL (ref 30.0–36.0)
MCV: 90.1 fL (ref 78.0–100.0)
Platelets: 254 10*3/uL (ref 150–400)
RBC: 3.44 MIL/uL — ABNORMAL LOW (ref 4.22–5.81)
RDW: 15.2 % (ref 11.5–15.5)
WBC: 7.1 10*3/uL (ref 4.0–10.5)

## 2010-10-09 LAB — BASIC METABOLIC PANEL
BUN: 13 mg/dL (ref 6–23)
CO2: 24 mEq/L (ref 19–32)
Calcium: 9 mg/dL (ref 8.4–10.5)
Chloride: 107 mEq/L (ref 96–112)
Creatinine, Ser: 1.26 mg/dL (ref 0.4–1.5)
GFR calc Af Amer: >60 mL/min (ref >60)
GFR calc non Af Amer: >60 mL/min (ref >60)
Glucose, Bld: 81 mg/dL (ref 70–99)
Potassium: 3.9 mEq/L (ref 3.5–5.1)
Sodium: 137 mEq/L (ref 135–145)

## 2010-10-10 ENCOUNTER — Telehealth: Payer: Self-pay | Admitting: *Deleted

## 2010-10-10 ENCOUNTER — Encounter: Payer: Self-pay | Admitting: Internal Medicine

## 2010-10-10 ENCOUNTER — Observation Stay (HOSPITAL_COMMUNITY)
Admission: AD | Admit: 2010-10-10 | Discharge: 2010-10-10 | Disposition: A | Discharge status: Home / Self Care | Payer: Self-pay | Source: Other Acute Inpatient Hospital | Attending: Internal Medicine | Admitting: Internal Medicine

## 2010-10-10 ENCOUNTER — Observation Stay (HOSPITAL_COMMUNITY): Payer: Self-pay

## 2010-10-10 DIAGNOSIS — Z8614 Personal history of Methicillin resistant Staphylococcus aureus infection: Secondary | ICD-10-CM | POA: Insufficient documentation

## 2010-10-10 DIAGNOSIS — B20 Human immunodeficiency virus [HIV] disease: Secondary | ICD-10-CM | POA: Insufficient documentation

## 2010-10-10 DIAGNOSIS — R209 Unspecified disturbances of skin sensation: Secondary | ICD-10-CM | POA: Insufficient documentation

## 2010-10-10 DIAGNOSIS — R42 Dizziness and giddiness: Secondary | ICD-10-CM

## 2010-10-10 DIAGNOSIS — F17200 Nicotine dependence, unspecified, uncomplicated: Secondary | ICD-10-CM | POA: Insufficient documentation

## 2010-10-10 DIAGNOSIS — M7989 Other specified soft tissue disorders: Secondary | ICD-10-CM

## 2010-10-10 LAB — CBC
HCT: 27.9 % — ABNORMAL LOW (ref 39.0–52.0)
Hemoglobin: 9.2 g/dL — ABNORMAL LOW (ref 13.0–17.0)
MCH: 28.5 pg (ref 26.0–34.0)
MCHC: 33 g/dL (ref 30.0–36.0)
MCV: 86.4 fL (ref 78.0–100.0)
Platelets: 266 10*3/uL (ref 150–400)
RBC: 3.23 MIL/uL — ABNORMAL LOW (ref 4.22–5.81)
RDW: 15 % (ref 11.5–15.5)
WBC: 5.8 10*3/uL (ref 4.0–10.5)

## 2010-10-10 LAB — URINALYSIS, ROUTINE W REFLEX MICROSCOPIC
Bilirubin Urine: NEGATIVE
Glucose, UA: NEGATIVE mg/dL
Hgb urine dipstick: NEGATIVE
Ketones, ur: NEGATIVE mg/dL
Nitrite: NEGATIVE
Protein, ur: NEGATIVE mg/dL
Specific Gravity, Urine: 1.011 (ref 1.005–1.030)
Urobilinogen, UA: 0.2 mg/dL (ref 0.0–1.0)
pH: 6 (ref 5.0–8.0)

## 2010-10-10 LAB — CARDIAC PANEL(CRET KIN+CKTOT+MB+TROPI)
CK, MB: 1.7 ng/mL (ref 0.3–4.0)
Relative Index: 1.3 (ref 0.0–2.5)
Total CK: 128 U/L (ref 7–232)
Troponin I: 0.01 ng/mL (ref 0.00–0.06)

## 2010-10-10 LAB — COMPREHENSIVE METABOLIC PANEL
ALT: 13 U/L (ref 0–53)
AST: 19 U/L (ref 0–37)
Albumin: 2.8 g/dL — ABNORMAL LOW (ref 3.5–5.2)
Alkaline Phosphatase: 68 U/L (ref 39–117)
BUN: 10 mg/dL (ref 6–23)
CO2: 26 mEq/L (ref 19–32)
Calcium: 8.8 mg/dL (ref 8.4–10.5)
Chloride: 109 mEq/L (ref 96–112)
Creatinine, Ser: 1.19 mg/dL (ref 0.4–1.5)
GFR calc Af Amer: >60 mL/min (ref >60)
GFR calc non Af Amer: >60 mL/min (ref >60)
Glucose, Bld: 107 mg/dL — ABNORMAL HIGH (ref 70–99)
Potassium: 3.6 mEq/L (ref 3.5–5.1)
Sodium: 141 mEq/L (ref 135–145)
Total Bilirubin: 0.3 mg/dL (ref 0.3–1.2)
Total Protein: 6.3 g/dL (ref 6.0–8.3)

## 2010-10-10 LAB — RAPID URINE DRUG SCREEN, HOSP PERFORMED
Amphetamines: NOT DETECTED
Barbiturates: NOT DETECTED
Benzodiazepines: NOT DETECTED
Cocaine: NOT DETECTED
Opiates: POSITIVE — AB
Tetrahydrocannabinol: NOT DETECTED

## 2010-10-10 LAB — LIPID PANEL
Cholesterol: 128 mg/dL (ref 0–200)
HDL: 28 mg/dL — ABNORMAL LOW (ref >39)
LDL Cholesterol: 59 mg/dL (ref 0–99)
Total CHOL/HDL Ratio: 4.6 RATIO
Triglycerides: 207 mg/dL — ABNORMAL HIGH (ref <150)
VLDL: 41 mg/dL — ABNORMAL HIGH (ref 0–40)

## 2010-10-10 LAB — APTT: aPTT: 30 seconds (ref 24–37)

## 2010-10-10 LAB — PROTIME-INR
INR: 1.01 (ref 0.00–1.49)
Prothrombin Time: 13.5 seconds (ref 11.6–15.2)

## 2010-10-10 LAB — HEMOGLOBIN A1C
Hgb A1c MFr Bld: 4.3 % — ABNORMAL LOW (ref <5.7)
Mean Plasma Glucose: 77 mg/dL (ref <117)

## 2010-10-10 NOTE — H&P (Signed)
Hospital Admission Note Date: 10/10/2010  **Place in progress notes section of chart**  Patient name: Casey Brennan Medical record number: [id number] Date of birth: January 10, 1961 Age: 50 y.o. Gender: male PCP: Cliffton Asters, MD, MD   Attending physician:  Dr. Aundria Rud    Resident (R2/R3): Dr. Denton Meek    Pager:  [phone] Resident (R1): Dr. Odis Luster     Pager: [phone]  Chief Complaint: dizziness  History of Present Illness:  Medications: Prezista Truvada Norvir Azithromycin 1200mg  po qweek Dapsone 100mg  po daily Albuterol  Allergies: Penicillins and Sulfonamide derivatives: rash     History   Social History  . Marital Status: Legally Separated    Spouse Name: N/A    Number of Children: N/A  . Years of Education: N/A   Occupational History  . Not on file.   Social History Main Topics  . Smoking status: Never Smoker   . Smokeless tobacco: Never Used  . Alcohol Use: No  . Drug Use: No  . Sexually Active: No   Other Topics Concern  . Not on file   Social History Narrative  . No narrative on file    Review of Systems: Pertinent items are noted in HPI.  Physical Exam:   Lab results:  Imaging results:   Other results:  Assessment & Plan by Problem: This encounter was created in error - please disregard.
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
## 2010-10-10 NOTE — Telephone Encounter (Signed)
Mailed appt for ventolin thru [facility] . [phone].
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
## 2010-10-11 ENCOUNTER — Encounter: Payer: Self-pay | Admitting: Adult Health
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
## 2010-10-17 ENCOUNTER — Telehealth: Payer: Self-pay | Admitting: Adult Health

## 2010-10-17 ENCOUNTER — Ambulatory Visit (INDEPENDENT_AMBULATORY_CARE_PROVIDER_SITE_OTHER): Payer: Self-pay | Admitting: Adult Health

## 2010-10-17 VITALS — BP 131/83 | HR 92 | Temp 98.0°F | Ht 65.0 in | Wt 187.0 lb

## 2010-10-17 DIAGNOSIS — B353 Tinea pedis: Secondary | ICD-10-CM

## 2010-10-17 DIAGNOSIS — D7218 Eosinophilia in diseases classified elsewhere: Secondary | ICD-10-CM

## 2010-10-17 DIAGNOSIS — B20 Human immunodeficiency virus [HIV] disease: Secondary | ICD-10-CM

## 2010-10-17 DIAGNOSIS — N611 Abscess of the breast and nipple: Secondary | ICD-10-CM

## 2010-10-17 DIAGNOSIS — D649 Anemia, unspecified: Secondary | ICD-10-CM

## 2010-10-17 DIAGNOSIS — L738 Other specified follicular disorders: Secondary | ICD-10-CM

## 2010-10-17 DIAGNOSIS — N61 Mastitis without abscess: Secondary | ICD-10-CM

## 2010-10-17 DIAGNOSIS — L678 Other hair color and hair shaft abnormalities: Secondary | ICD-10-CM

## 2010-10-17 MED ORDER — DAPSONE 100 MG PO TABS: 100.0000 mg | ORAL_TABLET | Freq: Every day | ORAL | Status: DC

## 2010-10-17 MED ORDER — AZITHROMYCIN 600 MG PO TABS: 1200.0000 mg | ORAL_TABLET | ORAL | Status: DC

## 2010-10-17 MED ORDER — FOLIC ACID 1 MG PO TABS: 1.0000 mg | ORAL_TABLET | Freq: Every day | ORAL | Status: DC

## 2010-10-17 NOTE — Progress Notes (Signed)
  Subjective:    Patient ID: Leafy Half, male    DOB: June 09, 1961, 50 y.o.   MRN: [id number]  HPI In for f/u.  Had hospitalization on Easter Sunday with 2-3 syncopal or near syncopal episodes.  Was told he had small strokes," but he denies thinking he had a stroke.  No symptoms since that time.  Skin rash improving.  Foot infection still present but not worse.  Adherent with meds with good tolerance.  Breast abscess healing but reports still having some clear drainage.    Review of Systems  Constitutional: Negative.   Eyes: Negative.   Respiratory: Negative.   Cardiovascular: Negative.   Gastrointestinal: Negative.   Genitourinary: Negative.   Musculoskeletal: Negative.   Skin: Positive for rash.  Neurological: Positive for dizziness, light-headedness and headaches. Negative for tremors, seizures, syncope, facial asymmetry, speech difficulty, weakness and numbness.  Hematological: Negative for adenopathy. Does not bruise/bleed easily.  Psychiatric/Behavioral: Negative.        Objective:   Physical Exam  Constitutional: He is oriented to person, place, and time. He appears well-developed and well-nourished. No distress.  HENT:  Head: Normocephalic and atraumatic.  Right Ear: External ear normal.  Left Ear: External ear normal.  Nose: Nose normal.  Mouth/Throat: Oropharynx is clear and moist. No oropharyngeal exudate.  Eyes: Conjunctivae and EOM are normal. Pupils are equal, round, and reactive to light. Left eye exhibits no discharge. No scleral icterus.  Neck: Normal range of motion. Neck supple. No JVD present. No tracheal deviation present. No thyromegaly present.  Cardiovascular: Normal rate, regular rhythm and intact distal pulses.  Exam reveals no gallop and no friction rub.   No murmur heard. Pulmonary/Chest: Effort normal and breath sounds normal. No respiratory distress. He has no wheezes. He has no rales. He exhibits no tenderness.  Abdominal: Soft. Bowel sounds are  normal. He exhibits no distension and no mass. There is no tenderness. There is no rebound and no guarding.  Musculoskeletal: Normal range of motion. He exhibits no edema and no tenderness.  Lymphadenopathy:    He has cervical adenopathy.  Neurological: He is alert and oriented to person, place, and time. He has normal reflexes. He displays normal reflexes. No cranial nerve deficit. He exhibits normal muscle tone. Coordination normal.  Skin: Skin is warm and dry. Rash noted. He is not diaphoretic. No erythema. No pallor.       Excoriation to interdigit spaces between toes persist but unchanged  Psychiatric: He has a normal mood and affect. His behavior is normal. Judgment and thought content normal.          Assessment & Plan:  HIV:  CD4 50 @ 2% with VL 601 copies/ml.  Clinically improving albeit slowly.  Will recommend CPM and rechecking labs in 6 weeks with a f/u in 2 months.  Verbally acknowledged and agreed with plan.  Eosinophilic folliculitis:  Improving  Tinea pedis:  Continue regimen with nystatin cream and mupirocin oint.  Syncopal episodes:  None since hospitalization,  Will monitor.  Need to review hospital note.  Breast abscess:  Reported as healing by surgery service.  Continue f/u with them.

## 2010-10-17 NOTE — Telephone Encounter (Signed)
Called placed to [facility] to Access to check status of application for assistance with Ventolin.  The application has not been received yet.[name]

## 2010-10-18 LAB — AFB CULTURE, BLOOD
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
## 2010-10-26 NOTE — Telephone Encounter (Signed)
Called to check on status of application for [employer].  The application has not been received as it was mailed to the incorrect PO Box and was remailed last Friday.  Will check the status again later in the week.[name]

## 2010-10-30 ENCOUNTER — Telehealth: Payer: Self-pay | Admitting: Internal Medicine

## 2010-10-30 NOTE — Telephone Encounter (Signed)
Patient's sister called up for swelling in patient's right foot since last 1 week which is worse today. I talked to the patient who sounded well. Patient was seen byMs  Sundra Aland 2weeks ago in perfectly stable condition. No redness noted, no open wounds, some burning sensation on inner side of foot. Patient is able to walk. No fever, chills, SOB, nausea or vomiting. Patient was told to elevate his legs above heart level, apply ice to the burning leg and take tylenol as needed. Patient was told to come to the ED if he felt so but he chose to stay home for tonght and come to the clinic tomorrow morning for complete evaluation. Patient agreed to come for evaluation in the morning to infectious disease clinic. Patient was informed in detail about the danger signs and to come to the ED immediately if fever is >101F, swelling increases in size with above measures or pain is worse.

## 2010-10-31 ENCOUNTER — Encounter (INDEPENDENT_AMBULATORY_CARE_PROVIDER_SITE_OTHER): Payer: Self-pay | Admitting: General Surgery

## 2010-10-31 NOTE — Telephone Encounter (Signed)
[name] asked me to look for a Prescription Assistance company for Vistaril.  We completed the application to [employer] and have given it to [facility] to sign off on and print the prescription to go with.[name]

## 2010-11-01 NOTE — Progress Notes (Signed)
  Subjective:    Patient ID: Casey Brennan, male    DOB: 1961/06/27, 50 y.o.   MRN: [id number]  HPI    Review of Systems     Objective:   Physical Exam        Assessment & Plan:

## 2010-11-02 ENCOUNTER — Other Ambulatory Visit: Payer: Self-pay | Admitting: Adult Health

## 2010-11-02 NOTE — Telephone Encounter (Signed)
Called [employer] to check the status of the application for Ventolin.  No proof of income was supplied because Casey Brennan is not working, so they have requested a letter from the provider stating this.  It will then need to be faxed and mailed.[name]

## 2010-11-09 NOTE — Telephone Encounter (Signed)
Called Casey Brennan and his sister Casey Brennan to ask for more information on the prescription for Vistaril.  They need to have the MD who wrote the original prescription to transfer the prescription to this office so we can print it out to send into Prescription Assistance.  Left a message at the home.[name]

## 2010-11-14 ENCOUNTER — Telehealth: Payer: Self-pay | Admitting: *Deleted

## 2010-11-14 DIAGNOSIS — R21 Rash and other nonspecific skin eruption: Secondary | ICD-10-CM

## 2010-11-14 MED ORDER — MUPIROCIN 2 % EX OINT: 1.0000 "application " | TOPICAL_OINTMENT | Freq: Two times a day (BID) | CUTANEOUS | Status: DC

## 2010-11-14 NOTE — Telephone Encounter (Signed)
Patient is requesting referral to ortho. For right leg pain and swelling.  Patient will need to go to [facility] for outpatient ortho. As he does not have insurance.  Also requesting an RX for Vistaril 25 mg. Said that he was given this in the hospital. Wendall Mola CMA

## 2010-11-15 LAB — AFB CULTURE WITH SMEAR (NOT AT ARMC): Acid Fast Smear: NONE SEEN

## 2010-11-16 ENCOUNTER — Telehealth: Payer: Self-pay | Admitting: *Deleted

## 2010-11-16 ENCOUNTER — Other Ambulatory Visit: Payer: Self-pay | Admitting: *Deleted

## 2010-11-16 DIAGNOSIS — L299 Pruritus, unspecified: Secondary | ICD-10-CM

## 2010-11-16 LAB — AFB CULTURE, BLOOD

## 2010-11-16 MED ORDER — HYDROXYZINE HCL 25 MG PO TABS: 25.0000 mg | ORAL_TABLET | Freq: Four times a day (QID) | ORAL | Status: DC | PRN

## 2010-11-16 MED ORDER — MUPIROCIN 2 % EX OINT: 1.0000 "application " | TOPICAL_OINTMENT | Freq: Two times a day (BID) | CUTANEOUS | Status: DC

## 2010-11-16 NOTE — Telephone Encounter (Signed)
Error, needed refill encounter

## 2010-11-16 NOTE — Telephone Encounter (Signed)
The application to [employer] for Vistaril (Atarax) was mailed today.  The prescription for Bactroban was sent to [facility] to Access also.  Call placed to the patient to let him know.[name]

## 2010-11-16 NOTE — Telephone Encounter (Signed)
Casey Brennan was in today asking for assistance for Bactroban.  He is already getting a medication through [facility] to Access who supplies that medication.  Once that prescription is printed, it will be sent to [facility] to Access and the order will be sent out.  He also informed me that Dr. Ulyess Mort prescribed Vistaril.  Angelique Blonder will enter it in EPIC and have Brad sign both prescriptions.[name]

## 2010-11-18 NOTE — Discharge Summary (Signed)
[facility]  Patient:    Casey Brennan, KISER Visit Number: [id number] MRN: [id number]          Service Type: REC Location: OMED Attending Physician:  Mady Gemma Dictated by:   Titus Dubin. Alwyn Ren, M.D. [facility] Admit Date:  03/22/2001 Disc. Date: 03/22/01   CC:         Corwin Levins, M.D. [facility]  Rockey Situ. Flavia Shipper., M.D.  Kennieth Rad, M.D.  Karie Soda. Joseph Art, M.D.   Discharge Summary  ADMISSION DIAGNOSES: 1. Cellulitis. 2. Right hip and low back pain, musculoskeletal versus an infectious disease    related.  DISCHARGE DIAGNOSES: 1. Folliculitis. 2. Methicillin-resistant Staphylococcus aureus bacteremia and probable right    gluteal abscess. 3. Anemia. 4. Transaminase elevation. 5. Electrolyte imbalance - natremia. 6. Decreased albumin related to malnutrition. 7. Pulmonary nodule. 8. Proctitis asymptomatic. 9. Human immunodeficiency virus infection.  PROCEDURES: 1. Punch biopsy of facial lesion on March 18, 2001. 2. Flexible proctosigmoidoscopy on March 20, 2001 revealing an ulcer    in the colon, infectious versus neoplastic or stercoral.  BRIEF HISTORY:  Mr. Schoen is a 50 year old black male who had received 10 days of Augmentin for a "facial infection."  There was recurrence after stopping the Augmentin.  He presented to the office of Dr. Oliver Barre on March 15, 2001 with high fevers, chills and right hip pain.  He was unable to stand to be weighed. He appeared severely ill.  There were multiple lesions impetigo infection type infection of the facial area bilaterally.  He had marked tenderness over the right greater trochanter and sciatic notch area.  Additional information was obtained after he was admitted and he had known positive HIV and had been followed at [facility] with the most recent studies being done in June 2002.  A risk factor survey revealed positive risk of sexual orientation.  He admitted  to the positive HIV testing at [facility]. He denied any treatment for HIV and denied having AIDS.  He was seen in consultation by Infectious Disease who added IV Vancomycin to Tequin.  He was seen in consultation by Dr. Myrtie Neither to aspirate the right trochanter and left shoulder but no fluid was obtained.  The Vancomycin had been added because of positive blood cultures of Staphylococcus.  GI was asked to see him because of questionable proctitis symptomatology. They performed a proctosigmoidoscope with the results as noted.  He did have mild transaminase elevations which was attributed most likely to his current infection.  He was placed on PCP prophylaxis with trimethoprim and sulfamethoxazole DS Monday, Wednesday, and Friday.  Prognosis was discussed by Dr. Roxan Hockey with the patient and HAART was recommended.  Arrangements were made for outpatient IV Vancomycin by Dr. Roxan Hockey with treatment ongoing for six weeks through a PICC line.  The patient would need weekly CBCs and differentials and CMET while on Vancomycin. HIV follow up was recommended at [facility].  A copy of this discharge summary will be sent to Dr. Roxan Hockey and he will be asked to arrange such if possible.  The patient has agreed to keep an appointment at the [facility] ID Department.  He was given the number of the [facility] in [place].  The colon biopsy showed ulcerated colonic mucosa with no viral cytopathic effects, parasites or other microorganisms or malignancies noted.  Abdominal ultrasound had revealed several gallstones without evidence of acute cholecystitis.  This was done because of transaminase elevations.  Also small  subcentimeter size cysts were noted in the left kidneys.  MRI of the right hip had revealed extensive right gluteal myositis involving the greater trochanteric bursa and also at the thigh.  There was scrotal inflammatory change on the right with possible right  iliac adenopathy.  Chest x-ray and CT had showed two tiny nodules in the right upper lobe which were nonspecific but noncalcified.  Follow up in two to three months following discharge will be recommended. Again a copy of this will be sent to Dr. Oliver Barre and this can be arranged with his office.  He had subsegmental atelectasis on the posterior basal segment of right lower lobe.  CT of the pelvis had revealed perirectal fluid and rectal wall thickening.  There was a possible phlegmon versus early abscess in the right gluteal medius muscle.  The most recent lab studies prior to discharge revealed a white count of 4300 with 2.66 red cells, hemoglobin 8.3, hematocrit 24.1.  His platelet count and INR were normal.  Sediment rate 59.  Reticulocyte count 0.8.  He had mild hyponatremia with values of 133 to 134.  The SGOT and SGPT were in the low 40 range and specifically the SGOT was 51 and the SGPT 55 at the time of discharge.  Albumin was 2.5.  CPK had been elevated to 618 in the setting of the gluteal abscess.  His TSH was normal.  He exhibited iron deficiency anemia with iron level of 10 with 6% saturation.  Serum folate level was normal. Serum ferritin was 974.  Hepatitis serologies were negative.  His C reactive protein was 22.9 and haptoglobin 179.  CD was less than 10 with normals of 400 to 2700 and T helper percentage was 2 with normals of 33 and 55.  Cryptococcal antigen latex was negative.  Despite the ulcers, hemoccult testing was negative.  At the time of discharge, his hip was well controlled with Celebrex 200 mg twice a day and prescription was written for this.  While on the Celebrex one to two a day as needed he was given Protonix to take 40 mg each morning.  Multivitamins with iron were recommended.  As stated he will take the sulfamethoxazole/trimethoprim one tablet Monday, Wednesday, and Friday.  The Vancomycin will be administered as an outpatient.  Although his  status is improved prognosis is guarded if he does not follow up as recommended with [facility]. These pulmonary nodules do not appear to be clinically significant at this time but Dr. Jonny Ruiz can follow up with these when he sees him. Dictated by:   Titus Dubin. Alwyn Ren, M.D. [facility] Attending Physician:  Mady Gemma DD:  03/22/01 TD:  03/22/01 Job: [id number]

## 2010-11-18 NOTE — H&P (Signed)
Casey Brennan. [facility]  Patient:    Casey Brennan, Casey Brennan Visit Number: [id number] MRN: [id number]          Service Type: MED Location: 1E 0101 01 Attending Physician:  Benny Lennert Dictated by:   Corwin Levins, M.D. [facility] Admit Date:  03/15/2001                           History and Physical  CHIEF COMPLAINT:  Right hip pain and fever.  HISTORY OF PRESENT ILLNESS:  [name] is a 50 year old black male not seen here in the office since April 2001.  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  He received 10 days of Augmentin, which helped his "facial infection" symptoms, restarted again about five days ago, and now here with severe increased temperature, chills, and right hippain progressively worsening over the period of time as well.  PAST MEDICAL HISTORY:  Pneumonia 1993.  PAST SURGICAL HISTORY:  Status post right knee arthroscopy.  ALLERGIES:  None.  MEDICATIONS:  None.  SOCIAL HISTORY:  Tobacco, half to one pack per day.  Alcohol:  None.  FAMILY HISTORY:  CHF, hypertension, diabetes.  PHYSICAL EXAMINATION:  VITAL SIGNS:  Blood pressure 120/70, respirations 20, pulse 116, temperature 104.4.  GENERAL:  Casey Brennan is a 50 year old black male.  He is unable to stand very well for the weight.  In general, he appears severely ill, difficult to sit up.  HEENT:  Sclerae clear.  TMs clear.  Pharynx benign.  There are too numerous to count impetigo-like lesions of the facial area bilaterally with marked bilateral but worse right submandibular lymphadenopathy.   Neck otherwise without lymphadenopathy, JVD, thyromegaly.  CHEST:  No rales or wheezing.  CARDIAC:  Regular rate and rhythm.  ABDOMEN:  Soft, nontender, positive bowel sounds, no organomegaly, no masses.  EXTREMITIES:  No edema.  There is marked tenderness over the right greater trochanter area and sciatic notch area.  ASSESSMENT AND PLAN: 1.  Facial cellulitis, severe.  To be admitted, given IV fluids.  Will check    blood cultures, IV Tequin, and routine labs. 2. Right hip and right low back pain.  Question musculoskeletal versus    infectious-related.  Will check right hip MRI. Dictated by:   Corwin Levins, M.D. [facility] Attending Physician:  Benny Lennert DD:  03/15/01 TD:  03/15/01 Job: [id number]

## 2010-11-21 ENCOUNTER — Other Ambulatory Visit: Payer: Self-pay | Admitting: *Deleted

## 2010-11-21 DIAGNOSIS — L299 Pruritus, unspecified: Secondary | ICD-10-CM

## 2010-11-21 MED ORDER — HYDROXYZINE HCL 25 MG PO TABS: 25.0000 mg | ORAL_TABLET | Freq: Four times a day (QID) | ORAL | Status: DC | PRN

## 2010-11-23 NOTE — Telephone Encounter (Signed)
Spoke with [employer]. vistoril approved x 1 year. We must call for re-orders each month. Notified pt & told him to call us when he is 10 days  from end of med His id # is [id number]

## 2010-11-24 ENCOUNTER — Encounter: Payer: Self-pay | Admitting: Adult Health

## 2010-11-24 ENCOUNTER — Ambulatory Visit (INDEPENDENT_AMBULATORY_CARE_PROVIDER_SITE_OTHER): Payer: Self-pay | Admitting: Adult Health

## 2010-11-24 DIAGNOSIS — L678 Other hair color and hair shaft abnormalities: Secondary | ICD-10-CM

## 2010-11-24 DIAGNOSIS — L738 Other specified follicular disorders: Secondary | ICD-10-CM

## 2010-11-24 DIAGNOSIS — J309 Allergic rhinitis, unspecified: Secondary | ICD-10-CM

## 2010-11-24 DIAGNOSIS — J45909 Unspecified asthma, uncomplicated: Secondary | ICD-10-CM | POA: Insufficient documentation

## 2010-11-24 MED ORDER — BUDESONIDE-FORMOTEROL FUMARATE 80-4.5 MCG/ACT IN AERO: 2.0000 | INHALATION_SPRAY | Freq: Two times a day (BID) | RESPIRATORY_TRACT | Status: DC

## 2010-11-24 MED ORDER — MOMETASONE FUROATE 50 MCG/ACT NA SUSP: 2.0000 | Freq: Every day | NASAL | Status: DC

## 2010-11-24 NOTE — Progress Notes (Signed)
Subjective:    Patient ID: Casey Brennan, male    DOB: 01-Mar-1961, 50 y.o.   MRN: [id number]  HPI Presents to clinic with complaints of ongoing swelling to his lower extremities, which has been chronic over the last 3-4 months. States when the swelling does occur. He ends up having pain in his legs, particularly around the joint areas of ankles, and knees. States that the swelling does go down when he is off his feet. Also complains of chronic sinus congestion and drainage with a related cough with wheezing and some mild shortness of breath. Does relate episodes of low-grade fever, and sweats.   Review of Systems  Constitutional: Positive for fever, chills, diaphoresis, activity change and fatigue. Negative for appetite change.  HENT: Positive for congestion, rhinorrhea, sneezing and postnasal drip. Negative for sore throat, mouth sores, trouble swallowing and voice change.   Eyes: Negative.   Respiratory: Positive for cough, chest tightness, shortness of breath and wheezing. Negative for choking.   Cardiovascular: Positive for leg swelling. Negative for chest pain and palpitations.  Gastrointestinal: Negative.   Genitourinary: Negative.   Musculoskeletal: Positive for joint swelling and arthralgias.  Skin: Positive for rash.  Neurological: Negative.   Hematological: Negative.   Psychiatric/Behavioral: Negative.        Objective:   Physical Exam  Constitutional: He is oriented to person, place, and time. He appears well-developed and well-nourished. No distress.  HENT:  Head: Normocephalic and atraumatic.       No thrush in the oropharynx, copious postnasal drainage noted No fluid behind TMs. Although bilaterally. There is bulging noted  Eyes: Conjunctivae and EOM are normal. Pupils are equal, round, and reactive to light.  Neck: Normal range of motion. Neck supple.  Cardiovascular: Normal rate, regular rhythm and intact distal pulses.   Pulmonary/Chest: He has wheezes.   Wheezes and rhonchi heard throughout lung fields  Abdominal: Soft. Bowel sounds are normal.  Musculoskeletal: He exhibits no edema and no tenderness.       No active edema noted to lower extremities on this physical examination  Neurological: He is alert and oriented to person, place, and time. No cranial nerve deficit. He exhibits normal muscle tone. Coordination normal.  Skin: Skin is warm and dry.       Old eosinophilic, follicular lesions noted on chest, back, arms, and legs  Psychiatric: He has a normal mood and affect. His behavior is normal. Judgment and thought content normal.          Assessment & Plan:  1. Leg Swelling. We discussed in detail the multiple causes dependent edema based on the multiple chronic states he currently has experienced. We recommend as we have in the past that he obtain support stockings (TED hose), and wear those regularly. We also recommend that when the swelling does occur he needs to stay off His legs until the swelling has cleared. We will continue to monitor this for the time being.  2. Chronic, Recurrent Allergic Sinusitis. This is refractory to cetirizine therapy, and all other conventional measures. Recommend at this point, we try Nasonex nasal spray, one spray each nostril daily, and we will increase as we see necessary.  3. Bronchitis with Reactive Airway Disease. This seems to be continuous chronic problem as well. We will recommend restarting Cipro 500 mg by mouth every 12 hours for the next 7 days and also add Symbicort 80/16.5 one puff twice a day. Also recommend guaifenesin DM. 3 teaspoons by mouth every 4 hours when  necessary.  4. Eosinophilic Folliculitis. We'll make a referral for dermatology to evaluate.  Verbally acknowledged all information that was provided and agreed with plan of care.

## 2010-11-29 ENCOUNTER — Other Ambulatory Visit: Payer: Self-pay | Admitting: *Deleted

## 2010-11-29 DIAGNOSIS — L299 Pruritus, unspecified: Secondary | ICD-10-CM

## 2010-11-29 NOTE — Telephone Encounter (Signed)
Phone call to pt.  No longer having issues with itching.  Sulfa-based medication stopped by B. Sundra Aland, NP.  Itching has stopped completely.  Jennet Maduro, RN.

## 2010-12-01 ENCOUNTER — Telehealth: Payer: Self-pay | Admitting: *Deleted

## 2010-12-01 NOTE — Telephone Encounter (Signed)
Mailed app to [employer] for symbicort. Copy in red file

## 2010-12-01 NOTE — Telephone Encounter (Signed)
Faxed app to [employer] for [employer] Copy in red file

## 2010-12-05 ENCOUNTER — Other Ambulatory Visit: Payer: Self-pay | Admitting: Internal Medicine

## 2010-12-05 ENCOUNTER — Other Ambulatory Visit: Payer: Self-pay | Admitting: Adult Health

## 2010-12-05 ENCOUNTER — Other Ambulatory Visit (INDEPENDENT_AMBULATORY_CARE_PROVIDER_SITE_OTHER): Payer: Self-pay

## 2010-12-05 ENCOUNTER — Telehealth: Payer: Self-pay | Admitting: *Deleted

## 2010-12-05 ENCOUNTER — Other Ambulatory Visit: Payer: Self-pay | Admitting: *Deleted

## 2010-12-05 DIAGNOSIS — K1370 Unspecified lesions of oral mucosa: Secondary | ICD-10-CM

## 2010-12-05 DIAGNOSIS — K1379 Other lesions of oral mucosa: Secondary | ICD-10-CM

## 2010-12-05 DIAGNOSIS — F419 Anxiety disorder, unspecified: Secondary | ICD-10-CM | POA: Insufficient documentation

## 2010-12-05 DIAGNOSIS — F411 Generalized anxiety disorder: Secondary | ICD-10-CM

## 2010-12-05 DIAGNOSIS — B20 Human immunodeficiency virus [HIV] disease: Secondary | ICD-10-CM

## 2010-12-05 LAB — COMPREHENSIVE METABOLIC PANEL
ALT: 10 U/L (ref 0–53)
AST: 15 U/L (ref 0–37)
Albumin: 4.3 g/dL (ref 3.5–5.2)
Alkaline Phosphatase: 69 U/L (ref 39–117)
BUN: 14 mg/dL (ref 6–23)
CO2: 32 mEq/L (ref 19–32)
Calcium: 9.4 mg/dL (ref 8.4–10.5)
Chloride: 101 mEq/L (ref 96–112)
Creat: 1.4 mg/dL — ABNORMAL HIGH (ref 0.50–1.35)
Glucose, Bld: 102 mg/dL — ABNORMAL HIGH (ref 70–99)
Potassium: 4.1 mEq/L (ref 3.5–5.3)
Sodium: 137 mEq/L (ref 135–145)
Total Bilirubin: 0.4 mg/dL (ref 0.3–1.2)
Total Protein: 7.4 g/dL (ref 6.0–8.3)

## 2010-12-05 LAB — CBC WITH DIFFERENTIAL/PLATELET
Basophils Absolute: 0 10*3/uL (ref 0.0–0.1)
Basophils Relative: 1 % (ref 0–1)
Eosinophils Absolute: 0.7 10*3/uL (ref 0.0–0.7)
Eosinophils Relative: 13 % — ABNORMAL HIGH (ref 0–5)
HCT: 33.2 % — ABNORMAL LOW (ref 39.0–52.0)
Hemoglobin: 10.6 g/dL — ABNORMAL LOW (ref 13.0–17.0)
Lymphocytes Relative: 20 % (ref 12–46)
Lymphs Abs: 1.1 10*3/uL (ref 0.7–4.0)
MCH: 27.6 pg (ref 26.0–34.0)
MCHC: 31.9 g/dL (ref 30.0–36.0)
MCV: 86.5 fL (ref 78.0–100.0)
Monocytes Absolute: 0.4 10*3/uL (ref 0.1–1.0)
Monocytes Relative: 7 % (ref 3–12)
Neutro Abs: 3.2 10*3/uL (ref 1.7–7.7)
Neutrophils Relative %: 59 % (ref 43–77)
Platelets: 231 10*3/uL (ref 150–400)
RBC: 3.84 MIL/uL — ABNORMAL LOW (ref 4.22–5.81)
RDW: 16.8 % — ABNORMAL HIGH (ref 11.5–15.5)
WBC: 5.4 10*3/uL (ref 4.0–10.5)

## 2010-12-05 MED ORDER — HYDROCODONE-ACETAMINOPHEN 7.5-325 MG PO TABS: 1.0000 | ORAL_TABLET | Freq: Four times a day (QID) | ORAL | Status: DC | PRN

## 2010-12-05 MED ORDER — ALPRAZOLAM 1 MG PO TABS: 1.0000 mg | ORAL_TABLET | Freq: Every evening | ORAL | Status: AC | PRN

## 2010-12-05 NOTE — Telephone Encounter (Signed)
Called the patients sister April who is his contact to advise her that we rcv the medication Vistaril 25mg  and that she could pick it up for the patient. Will advise her to have him take it only as needed and to stay hydrated.

## 2010-12-06 ENCOUNTER — Telehealth: Payer: Self-pay | Admitting: Licensed Clinical Social Worker

## 2010-12-06 LAB — HIV-1 RNA QUANT-NO REFLEX-BLD
HIV 1 RNA Quant: 146 copies/mL — ABNORMAL HIGH (ref <20)
HIV-1 RNA Quant, Log: 2.16 {Log} — ABNORMAL HIGH (ref <1.30)

## 2010-12-06 LAB — T-HELPER CELL (CD4) - (RCID CLINIC ONLY)
CD4 % Helper T Cell: 6 % — ABNORMAL LOW (ref 33–55)
CD4 T Cell Abs: 60 uL — ABNORMAL LOW (ref 400–2700)

## 2010-12-06 NOTE — Telephone Encounter (Signed)
Patient's sister called stating the patient was having right leg pain. Denies redness, swelling, or hot to the touch. Patient's sister was worried that it could be urgent. I offered her an appointment for him tomorrow at 2:00pm she accepted. I explained to her if his leg pain increases, becomes red or starts to swell to go to the ED.

## 2010-12-07 ENCOUNTER — Ambulatory Visit (INDEPENDENT_AMBULATORY_CARE_PROVIDER_SITE_OTHER): Payer: Self-pay | Admitting: Adult Health

## 2010-12-07 ENCOUNTER — Other Ambulatory Visit: Payer: Self-pay | Admitting: *Deleted

## 2010-12-07 DIAGNOSIS — Y92009 Unspecified place in unspecified non-institutional (private) residence as the place of occurrence of the external cause: Secondary | ICD-10-CM

## 2010-12-07 DIAGNOSIS — R600 Localized edema: Secondary | ICD-10-CM

## 2010-12-07 DIAGNOSIS — R609 Edema, unspecified: Secondary | ICD-10-CM

## 2010-12-07 DIAGNOSIS — W19XXXA Unspecified fall, initial encounter: Secondary | ICD-10-CM

## 2010-12-19 ENCOUNTER — Ambulatory Visit: Payer: Self-pay | Admitting: Adult Health

## 2010-12-28 ENCOUNTER — Telehealth (INDEPENDENT_AMBULATORY_CARE_PROVIDER_SITE_OTHER): Payer: Self-pay | Admitting: General Surgery

## 2010-12-28 NOTE — Telephone Encounter (Signed)
Patient's sister called regarding Casey Brennan. Sister states he has had white left nipple discharge that has a slight odor for 3 days. States area is not warm to touch, swollen, or red. States patient is currently taking Cipro because he has just gotten over pneumonia and is also currently taking Zithromycin on a regular basis every 7 days due to his HIV. Please advise.

## 2010-12-29 NOTE — Telephone Encounter (Signed)
Per Dr Dwain Sarna: I think just watching is fine for now as long as not red, no fever etc. If continues or any of those develop then he should come back and see me. If he has any signs of infection, needs to come to urgent office. I called patient's sister and left message on machine.

## 2011-01-02 NOTE — Telephone Encounter (Signed)
I spoke with patient's sister and made her aware that I made appt for Leevon on Friday 01/06/11 to arrive at 9:20am.

## 2011-01-06 ENCOUNTER — Ambulatory Visit (INDEPENDENT_AMBULATORY_CARE_PROVIDER_SITE_OTHER): Payer: Self-pay | Admitting: General Surgery

## 2011-01-06 ENCOUNTER — Encounter (INDEPENDENT_AMBULATORY_CARE_PROVIDER_SITE_OTHER): Payer: Self-pay | Admitting: General Surgery

## 2011-01-06 DIAGNOSIS — N6452 Nipple discharge: Secondary | ICD-10-CM

## 2011-01-06 DIAGNOSIS — N6459 Other signs and symptoms in breast: Secondary | ICD-10-CM

## 2011-01-06 NOTE — Progress Notes (Signed)
Subjective:     Patient ID: Casey Brennan, male   DOB: 08-07-1960, 50 y.o.   MRN: [id number]    There were no vitals taken for this visit.    HPIThis is a 50 year old HIV-positive male who had a chronic left breast abscess. This was in the subareolar position. I took him to the operating room on March 21 and he underwent excision of this entire subareolar mass. The pathology returned as epidermal inclusion cyst. I left this area partially open a drain for a while eventually healed up. He's doing pretty well until this last week when he noted a small amount of whitish drainage coming from his nipple a couple of times. He has no other symptoms of fever or any mass. This is just happened twice at this point is that a small amount of this material. He came in today just to have this checked to make sure this was within normal limits.   Review of Systems     Objective:   Physical Exam Left periareolar incision without infection, scar present but no mass, no residual cyst, no nipple discharge    Assessment:     S/p excision of sebaceous cyst with long history of infection left subareolar Nipple discharge    Plan:        His exam today shows that this area is all consistent with normal healing. He has some scarring at that region but really no mass no evidence of any infection and no nipple discharge I can identify today. He was very concerned about saving his nipple previously so I did not remove his nipple or's areolar complex. I think that if we do anything else surgically is going to need necessitate removing his nipple areolar complex. I think he just has some residual fluid in the cavity where this was done and he had some discharge. I don't think he needs anything further done at this point. I don't think he has an infection at all either. I asked him to come back and see me if he has fevers, if he has any other complaints referable to this. He also told me he had a small lump on his right  flank today that I examined this was consistent with a lipoma and it has been the same for a number of years. I told them to be fine just to continue following this.

## 2011-01-09 ENCOUNTER — Ambulatory Visit (INDEPENDENT_AMBULATORY_CARE_PROVIDER_SITE_OTHER): Payer: Medicaid Other | Admitting: Adult Health

## 2011-01-09 ENCOUNTER — Encounter: Payer: Self-pay | Admitting: Adult Health

## 2011-01-09 DIAGNOSIS — D7218 Other specified follicular disorders: Secondary | ICD-10-CM

## 2011-01-09 DIAGNOSIS — F0630 Mood disorder due to known physiological condition, unspecified: Secondary | ICD-10-CM

## 2011-01-09 DIAGNOSIS — L738 Other specified follicular disorders: Secondary | ICD-10-CM

## 2011-01-09 DIAGNOSIS — R51 Headache: Secondary | ICD-10-CM

## 2011-01-09 DIAGNOSIS — B20 Human immunodeficiency virus [HIV] disease: Secondary | ICD-10-CM

## 2011-01-09 DIAGNOSIS — L678 Other hair color and hair shaft abnormalities: Secondary | ICD-10-CM

## 2011-01-09 MED ORDER — RALTEGRAVIR POTASSIUM 400 MG PO TABS: 400.0000 mg | ORAL_TABLET | Freq: Two times a day (BID) | ORAL | Status: DC

## 2011-01-09 MED ORDER — HYDROCODONE-IBUPROFEN 7.5-200 MG PO TABS: 1.0000 | ORAL_TABLET | Freq: Four times a day (QID) | ORAL | Status: DC | PRN

## 2011-01-09 MED ORDER — ALPRAZOLAM 1 MG PO TABS: 1.0000 mg | ORAL_TABLET | Freq: Every evening | ORAL | Status: DC | PRN

## 2011-01-16 ENCOUNTER — Other Ambulatory Visit: Payer: Self-pay | Admitting: *Deleted

## 2011-01-16 MED ORDER — MUPIROCIN 2 % EX OINT: 1.0000 "application " | TOPICAL_OINTMENT | Freq: Two times a day (BID) | CUTANEOUS | Status: DC

## 2011-01-26 ENCOUNTER — Telehealth: Payer: Self-pay | Admitting: *Deleted

## 2011-01-26 MED ORDER — HYDROXYZINE PAMOATE 25 MG PO CAPS: ORAL_CAPSULE | ORAL | Status: DC

## 2011-01-26 NOTE — Telephone Encounter (Signed)
Notified patient PA medication arrived for Vistaril 25 mg., take one tablet every six hours as needed for itching.  4 bottles of #100 each.  Patient given one bottle. Lot # [id number]  Exp. 1/17 Wendall Mola CMA

## 2011-01-26 NOTE — Progress Notes (Signed)
  Subjective:    Patient ID: Casey Brennan, male    DOB: September 09, 1960, 50 y.o.   MRN: [id number]  HPI    Review of Systems     Objective:   Physical Exam        Assessment & Plan:

## 2011-02-06 ENCOUNTER — Other Ambulatory Visit: Payer: Self-pay | Admitting: Adult Health

## 2011-02-06 ENCOUNTER — Ambulatory Visit (INDEPENDENT_AMBULATORY_CARE_PROVIDER_SITE_OTHER): Payer: Self-pay | Admitting: Adult Health

## 2011-02-06 ENCOUNTER — Encounter: Payer: Self-pay | Admitting: Adult Health

## 2011-02-06 ENCOUNTER — Other Ambulatory Visit (INDEPENDENT_AMBULATORY_CARE_PROVIDER_SITE_OTHER): Payer: Medicaid Other

## 2011-02-06 ENCOUNTER — Other Ambulatory Visit: Payer: Self-pay

## 2011-02-06 VITALS — BP 130/79 | HR 82 | Temp 98.4°F | Ht 68.0 in | Wt 200.0 lb

## 2011-02-06 DIAGNOSIS — J309 Allergic rhinitis, unspecified: Secondary | ICD-10-CM

## 2011-02-06 DIAGNOSIS — B20 Human immunodeficiency virus [HIV] disease: Secondary | ICD-10-CM

## 2011-02-06 DIAGNOSIS — R51 Headache: Secondary | ICD-10-CM

## 2011-02-06 DIAGNOSIS — Z113 Encounter for screening for infections with a predominantly sexual mode of transmission: Secondary | ICD-10-CM

## 2011-02-06 LAB — CBC WITH DIFFERENTIAL/PLATELET
Basophils Absolute: 0 10*3/uL (ref 0.0–0.1)
Basophils Relative: 1 % (ref 0–1)
Eosinophils Absolute: 0.7 10*3/uL (ref 0.0–0.7)
Eosinophils Relative: 14 % — ABNORMAL HIGH (ref 0–5)
HCT: 35.9 % — ABNORMAL LOW (ref 39.0–52.0)
Hemoglobin: 11.6 g/dL — ABNORMAL LOW (ref 13.0–17.0)
Lymphocytes Relative: 50 % — ABNORMAL HIGH (ref 12–46)
Lymphs Abs: 2.7 10*3/uL (ref 0.7–4.0)
MCH: 27.4 pg (ref 26.0–34.0)
MCHC: 32.3 g/dL (ref 30.0–36.0)
MCV: 84.7 fL (ref 78.0–100.0)
Monocytes Absolute: 0.5 10*3/uL (ref 0.1–1.0)
Monocytes Relative: 9 % (ref 3–12)
Neutro Abs: 1.5 10*3/uL — ABNORMAL LOW (ref 1.7–7.7)
Neutrophils Relative %: 28 % — ABNORMAL LOW (ref 43–77)
Platelets: 216 10*3/uL (ref 150–400)
RBC: 4.24 MIL/uL (ref 4.22–5.81)
RDW: 16.7 % — ABNORMAL HIGH (ref 11.5–15.5)
WBC: 5.5 10*3/uL (ref 4.0–10.5)

## 2011-02-06 LAB — COMPLETE METABOLIC PANEL WITH GFR
ALT: 14 U/L (ref 0–53)
AST: 15 U/L (ref 0–37)
Albumin: 4.1 g/dL (ref 3.5–5.2)
Alkaline Phosphatase: 79 U/L (ref 39–117)
BUN: 10 mg/dL (ref 6–23)
CO2: 28 mEq/L (ref 19–32)
Calcium: 9.1 mg/dL (ref 8.4–10.5)
Chloride: 103 mEq/L (ref 96–112)
Creat: 1.46 mg/dL — ABNORMAL HIGH (ref 0.50–1.35)
GFR, Est African American: >60 mL/min (ref >60)
GFR, Est Non African American: 52 mL/min — ABNORMAL LOW (ref >60)
Glucose, Bld: 76 mg/dL (ref 70–99)
Potassium: 4 mEq/L (ref 3.5–5.3)
Sodium: 137 mEq/L (ref 135–145)
Total Bilirubin: 0.5 mg/dL (ref 0.3–1.2)
Total Protein: 7.1 g/dL (ref 6.0–8.3)

## 2011-02-06 LAB — RPR

## 2011-02-06 LAB — URINALYSIS, ROUTINE W REFLEX MICROSCOPIC
Glucose, UA: NEGATIVE mg/dL
Hgb urine dipstick: NEGATIVE
Leukocytes, UA: NEGATIVE
Nitrite: NEGATIVE
Protein, ur: NEGATIVE mg/dL
Specific Gravity, Urine: 1.019 (ref 1.005–1.030)
Urobilinogen, UA: 0.2 mg/dL (ref 0.0–1.0)
pH: 5.5 (ref 5.0–8.0)

## 2011-02-06 MED ORDER — BUTALBITAL-APAP-CAFFEINE 50-325-40 MG PO CAPS: 1.0000 | ORAL_CAPSULE | ORAL | Status: AC | PRN

## 2011-02-06 NOTE — Patient Instructions (Signed)
Tension Headache (Muscle Contraction Headache) Tension headache is one of the most common causes of head pain. These headaches are usually felt as a pain over the top of your head and back of your neck. Stress, anxiety, and depression are common triggers for these headaches. Tension headaches are not life-threatening and will not lead to other types of headaches. Tension headaches can often be diagnosed by taking a history from the patient and a physical exam. Sometimes, further lab and x-ray studies are used to confirm the diagnosis. Your caregiver can advise you on how to get help solving problems that cause anxiety or stress. Antidepressants can be prescribed if depression is a problem. HOME CARE INSTRUCTIONS  If testing was done, call for your results. Remember, it is your responsibility to get the results of all testing. Do not assume everything is fine because you do not hear from your caregiver.   Only take over-the-counter or prescription medicines for pain, discomfort, or fever as directed by your caregiver.   Biofeedback, massage, or other relaxation techniques may be helpful.   Ice packs or heat to the head and neck can be used. Use these three to four times per day or as needed.   Physical therapy may be a useful addition to treatment.   If headaches continue, even with therapy, you may need to think about lifestyle changes.   Avoid excessive use of pain killers, as rebound headaches can occur.  1.  SEEK MEDICAL CARE IF:  You develop problems with medications prescribed.   You do not respond or get no relief from medications.   You have a change from the usual headache.   You develop nausea (feeling sick to your stomach) or vomiting.  SEEK IMMEDIATE MEDICAL CARE IF:    Your headache becomes severe.   You have an unexplained oral temperature above 102.   You develop a stiff neck.   You have loss of vision.   You have muscular weakness.   You have loss of muscular  control.   You develop severe symptoms different from your first symptoms.   You start losing your balance or have trouble walking.   You feel faint or pass out.  MAKE SURE YOU:    Understand these instructions.   Will watch your condition.   Will get help right away if you are not doing well or get worse.  Document Released: 06/19/2005 Document Re-Released: 09/15/2008 [facility] Patient Information 2011 [place], [place].  Sinus Headache A sinus headache is when your sinuses become clogged or swollen. Sinus headaches can range from mild to severe. Some sinus headaches can actually be migraine headaches.   CAUSES  A sinus headache can have different causes, such as:   Colds.   Sinus infections.   Allergies.   Toothaches.   Weather changes.   Stress.  SYMPTOMS  Symptoms of a sinus headache may vary and can include:   Headache.   Pain or pressure in the face.   Congested or runny nose.   Fever.   Inability to smell.  TREATMENT  The treatment of a sinus headache depends on the cause:   Antibiotics. If you have a sinus infection, antibiotic treatment may be needed to treat the infection.   Antihistamines and nasal cortisone medicine. Sinus pain caused by allergies may be helped by these types of medication.   Nasal saline solution. Pain caused by congestion may be decreased by irrigating the nose and sinuses with saline solution.  HOME CARE INSTRUCTIONS  Take medications as told by your caregiver.   If you are taking antibiotics for a sinus infection, make sure you take all of the medication as told.   Nasal sprays may help reduce pressure symptoms.   Only take over-the-counter or prescription medicines for pain, discomfort, or fever as told by your caregiver.  SEEK IMMEDIATE MEDICAL CARE IF YOU DEVELOP:  A high fever or chills (Temperature greater than 102.5 F or 39.2 C).   Headaches more than once per week.   Sensitivity to light or sound.    Repeated nausea and vomiting.   Problems seeing.   Sudden severe pain in your face or head.   Seizures.   Confused thinking.  MAKE SURE YOU:    Understand these instructions.   Will watch your condition.   Will get help right away if you are not doing well or get worse.  Document Released: 07/27/2004 Document Re-Released: 09/13/2009 [facility] Patient Information 2011 [place], [place].

## 2011-02-07 LAB — GC/CHLAMYDIA PROBE AMP, URINE
Chlamydia, Swab/Urine, PCR: NEGATIVE
GC Probe Amp, Urine: NEGATIVE

## 2011-02-07 LAB — T-HELPER CELL (CD4) - (RCID CLINIC ONLY)
CD4 % Helper T Cell: 6 % — ABNORMAL LOW (ref 33–55)
CD4 T Cell Abs: 170 uL — ABNORMAL LOW (ref 400–2700)

## 2011-02-08 LAB — HIV-1 RNA QUANT-NO REFLEX-BLD
HIV 1 RNA Quant: 61 copies/mL — ABNORMAL HIGH (ref <20)
HIV-1 RNA Quant, Log: 1.79 {Log} — ABNORMAL HIGH (ref <1.30)

## 2011-02-10 NOTE — Progress Notes (Signed)
  Subjective:    Patient ID: Casey Brennan, male    DOB: Mar 25, 1961, 50 y.o.   MRN: [id number]  HPI Presents to clinic with a five-day history of a right-sided headache that has been persistent with no precipitating aggravating or alleviating factors. States he's also been having some significant sinus congestion, but has not been using his nasal steroids as was previously prescribed. Denies ringing in the ears, balance, or gait problems, photophobia, nausea, or vomiting. Describes headaches as a viselike that remains persistent on the right side, but does progress to the left side after a period of time. Does complain of dizziness, however   Review of Systems  Constitutional: Positive for activity change. Negative for fever, chills, diaphoresis, appetite change and unexpected weight change.  HENT: Positive for congestion and sinus pressure. Negative for hearing loss, ear pain, nosebleeds, sore throat, facial swelling, rhinorrhea, drooling, mouth sores, trouble swallowing, neck pain, neck stiffness, dental problem, voice change, postnasal drip, tinnitus and ear discharge.   Eyes: Negative for photophobia, pain, discharge, redness, itching and visual disturbance.  Respiratory: Negative for cough, choking, chest tightness and shortness of breath.   Cardiovascular: Positive for leg swelling. Negative for chest pain and palpitations.  Gastrointestinal: Negative for nausea, vomiting and diarrhea.  Genitourinary: Negative.   Musculoskeletal: Negative.   Skin: Negative.   Neurological: Positive for dizziness and headaches. Negative for tremors, seizures, syncope, facial asymmetry, speech difficulty, weakness, light-headedness and numbness.  Hematological: Negative.   Psychiatric/Behavioral: Negative.        Objective:   Physical Exam  Constitutional: He is oriented to person, place, and time. He appears well-developed and well-nourished. No distress.       He appears in moderate discomfort    HENT:  Head: Normocephalic and atraumatic.  Nose: Nose normal.  Mouth/Throat: Oropharynx is clear and moist. No oropharyngeal exudate.       TMs bulging bilaterally  Eyes: Conjunctivae and EOM are normal.  Neck: Normal range of motion. Neck supple. No JVD present. No tracheal deviation present. No thyromegaly present.  Cardiovascular: Normal rate, regular rhythm, normal heart sounds and intact distal pulses.   Pulmonary/Chest: Effort normal and breath sounds normal. No stridor.  Abdominal: Soft. Bowel sounds are normal.  Musculoskeletal: Normal range of motion. He exhibits edema.  Lymphadenopathy:    He has cervical adenopathy.  Neurological: He is alert and oriented to person, place, and time. He has normal reflexes. No cranial nerve deficit. He exhibits normal muscle tone. Coordination normal.  Skin: Skin is warm and dry.  Psychiatric: His behavior is normal. Judgment and thought content normal.       Affect is blunted          Assessment & Plan:  Headache. This could be twofold both tension headache, and sinus headache. We are going to encourage that he continue using his Zyrtec therapy, and his Nasonex therapy. We'll also add butalbital/APAP/caffeine one tablet by mouth every 4 hours when necessary. General information regarding headaches and sinus headaches. Were provided to him. He should contact the clinic should the symptoms not improve or followup on his next scheduled visit.  He verbally acknowledged this information and agreed with plan of care.

## 2011-02-20 ENCOUNTER — Ambulatory Visit: Payer: Self-pay | Admitting: Adult Health

## 2011-02-22 ENCOUNTER — Ambulatory Visit (INDEPENDENT_AMBULATORY_CARE_PROVIDER_SITE_OTHER): Payer: Self-pay | Admitting: Adult Health

## 2011-02-22 ENCOUNTER — Encounter: Payer: Self-pay | Admitting: Adult Health

## 2011-02-22 VITALS — BP 145/96 | HR 73 | Temp 98.0°F | Ht 68.0 in | Wt 202.0 lb

## 2011-02-22 DIAGNOSIS — B20 Human immunodeficiency virus [HIV] disease: Secondary | ICD-10-CM

## 2011-02-22 DIAGNOSIS — R109 Unspecified abdominal pain: Secondary | ICD-10-CM

## 2011-02-22 DIAGNOSIS — R51 Headache: Secondary | ICD-10-CM

## 2011-02-22 DIAGNOSIS — Z79899 Other long term (current) drug therapy: Secondary | ICD-10-CM

## 2011-02-22 DIAGNOSIS — Z23 Encounter for immunization: Secondary | ICD-10-CM

## 2011-02-22 MED ORDER — SUMATRIPTAN SUCCINATE 100 MG PO TABS: 100.0000 mg | ORAL_TABLET | Freq: Once | ORAL | Status: DC | PRN

## 2011-02-22 NOTE — Progress Notes (Signed)
  Subjective:    Patient ID: Casey Brennan, male    DOB: 04-21-1961, 50 y.o.   MRN: [id number]  HPI Casey Brennan presents to clinic today for routine scheduled followup. He endorses adherence to his medications with good tolerance and no complications. Did relate a two-day history of left flank pain. That has since dissipated. Denies any hematuria, painful urination, nausea, or vomiting. He also relates that his headaches were not well-controlled with the butalbital with caffeine and APAP. That was prescribed on his last visit. States his headaches remain, unilateral on left side, viselike, and without an aura.   Review of Systems  Constitutional: Negative.   Eyes: Negative.   Respiratory: Negative.   Cardiovascular: Negative.   Gastrointestinal: Negative.   Genitourinary: Positive for flank pain.  Musculoskeletal: Negative.   Skin: Positive for rash.       Although he relates that his rash has persisted. He states it is actually relatively stable and not doing any worse  Neurological: Positive for headaches.  Hematological: Negative.   Psychiatric/Behavioral: Negative.        Objective:   Physical Exam  Constitutional: He is oriented to person, place, and time. He appears well-developed and well-nourished. No distress.  HENT:  Head: Normocephalic and atraumatic.  Right Ear: External ear normal.  Left Ear: External ear normal.  Nose: Nose normal.  Mouth/Throat: Oropharynx is clear and moist.  Eyes: Conjunctivae and EOM are normal. Pupils are equal, round, and reactive to light.  Neck: Normal range of motion. Neck supple.  Cardiovascular: Normal rate, regular rhythm, normal heart sounds and intact distal pulses.   Pulmonary/Chest: Effort normal and breath sounds normal.  Abdominal: Soft. Bowel sounds are normal.  Musculoskeletal: Normal range of motion.  Neurological: He is alert and oriented to person, place, and time. No cranial nerve deficit. He exhibits normal muscle tone.  Coordination normal.  Skin: Skin is warm and dry. He is not diaphoretic.  Psychiatric: He has a normal mood and affect. His behavior is normal. Judgment and thought content normal.          Assessment & Plan:  1. HIV. Labs obtained 02/06/2011. Show a CD4 count of 170 at 6% with a viral load of 61 copies per mL. While this continues to show gradual improvement. CD4 reconstitution seems to be very slow and virologic control has never been complete. As these numbers are too low to assess a genotype, on his next blood draw, we should obtain a trofile DNA assay to see, if he is able to also take miraviroc as part of his regimen. Until then, we will continue present management. However, should his CD4 count is high, as it was on this last draw, we will discontinue his azithromycin for MAC prophylaxis. We will ask that he return to clinic in 3 months for labs with a followup in 3-1/2 months.  2. Headaches. Not responding to Fioricet therapy. We will try sumatriptan 100 mg when necessary for headache, and see, if this helps. If it does not, we will refer him to a headache clinic for further evaluation.  3. Flank Pain. Pain, not currently present. Was transient in nature, with no accompanying symptoms. May have been gas or perhaps, a small stone that was passed. We will continue to monitor this and if his symptoms return he should notify us.  He verbally acknowledged all information that was provided to him and agreed with plan of care.

## 2011-02-22 NOTE — Patient Instructions (Signed)
What is this medicine? SUMATRIPTAN (soo ma TRIP tan) is used to treat migraines with or without aura. An aura is a strange feeling or visual disturbance that warns you of an attack. It is not used to prevent migraines. This medicine may be used for other purposes; ask your health care provider or pharmacist if you have questions.   What should I tell my health care provider before I take this medicine? They need to know if you have any of these conditions: -bowel disease or colitis -diabetes -family history of heart disease -fast or irregular heart beat -heart or blood vessel disease, angina (chest pain), or previous heart attack -high blood pressure -high cholesterol -history of stroke, transient ischemic attacks (TIAs or mini-strokes), or intracranial bleeding -kidney or liver disease -overweight -poor circulation -postmenopausal or surgical removal of uterus and ovaries -Raynaud's disease -seizure disorder -an unusual or allergic reaction to sumatriptan, other medicines, foods, dyes, or preservatives -pregnant or trying to get pregnant -breast-feeding   How should I use this medicine? Take this medicine by mouth with a glass of water. Follow the directions on the prescription label. This medicine is taken at the first symptoms of a migraine. It is not for everyday use. If your migraine headache returns after one dose, you can take another dose as directed. You must leave at least 2 hours between doses, and do not take more than 100 mg as a single dose. Do not take more than 200 mg total in any 24 hour period. If there is no improvement at all after the first dose, do not take a second dose without talking to your doctor or health care professional. Do not take your medicine more often than directed.   Talk to your pediatrician regarding the use of this medicine in children. Special care may be needed.   Overdosage: If you think you have taken too much of this medicine contact a  poison control center or emergency room at once. NOTE: This medicine is only for you. Do not share this medicine with others.   What if I miss a dose? This does not apply; this medicine is not for regular use.   What may interact with this medicine? Do not take this medicine with any of the following medicines: -amphetamine or cocaine -dihydroergotamine, ergotamine, ergoloid mesylates, methysergide, or ergot-type medication - do not take within 24 hours of taking sumatriptan -feverfew -MAOIs like Carbex, Eldepryl, Marplan, Nardil, and Parnate - do not take sumatriptan within 2 weeks of stopping MAOI therapy -other migraine medicines like almotriptan, eletriptan, naratriptan, rizatriptan, zolmitriptan - do not take within 24 hours of taking sumatriptan -tryptophan   This medicine may also interact with the following medications: -lithium -medicines for mental depression, anxiety or mood problems -medicines for weight loss such as dexfenfluramine, dextroamphetamine, fenfluramine, or sibutramine -St. [name]'s wort   This list may not describe all possible interactions. Give your health care provider a list of all the medicines, herbs, non-prescription drugs, or dietary supplements you use. Also tell them if you smoke, drink alcohol, or use illegal drugs. Some items may interact with your medicine.   What should I watch for while using this medicine? Only take this medicine for a migraine headache. Take it if you get warning symptoms or at the start of a migraine attack. It is not for regular use to prevent migraine attacks.   You may get drowsy or dizzy. Do not drive, use machinery, or do anything that needs mental alertness until you  know how this medicine affects you. To reduce dizzy or fainting spells, do not sit or stand up quickly, especially if you are an older patient. Alcohol can increase drowsiness, dizziness and flushing. Avoid alcoholic drinks.   Smoking cigarettes may increase the  risk of heart-related side effects from using this medicine.   What side effects may I notice from receiving this medicine? Side effects that you should report to your doctor or health care professional as soon as possible: -allergic reactions like skin rash, itching or hives, swelling of the face, lips, or tongue -breathing problems -changes in vision -chest or throat pain, tightness -fast, slow, or irregular heart beat -hallucinations -increased or decreased blood pressure -problems with balance, talking, walking -seizures -severe stomach pain and cramping, bloody diarrhea -tingling, pain, or numbness in the face, hands or feet   Side effects that usually do not require medical attention (report to your doctor or health care professional if they continue or are bothersome): -drowsiness -feeling warm, flushing, or redness of the face -muscle pain or cramps -nausea, vomiting, diarrhea or stomach upset -weak or tired   This list may not describe all possible side effects. Call your doctor for medical advice about side effects. You may report side effects to FDA at 1-800-FDA-1088.   Where should I keep my medicine? Keep out of the reach of children.   Store at room temperature between 2 and 30 degrees C (36 and 86 degrees F). Throw away any unused medicine after the expiration date.   NOTE:This sheet is a summary. It may not cover all possible information. If you have questions about this medicine, talk to your doctor, pharmacist, or health care provider.      2011, Elsevier/Gold Standard.

## 2011-02-28 ENCOUNTER — Other Ambulatory Visit: Payer: Self-pay | Admitting: *Deleted

## 2011-02-28 ENCOUNTER — Telehealth: Payer: Self-pay | Admitting: Licensed Clinical Social Worker

## 2011-02-28 DIAGNOSIS — R51 Headache: Secondary | ICD-10-CM

## 2011-02-28 MED ORDER — HYDROCODONE-IBUPROFEN 7.5-200 MG PO TABS: 1.0000 | ORAL_TABLET | Freq: Four times a day (QID) | ORAL | Status: DC | PRN

## 2011-02-28 NOTE — Telephone Encounter (Signed)
Penny from [facility] called stating that this patient was trying to request pain medicine at [facility] from an old script from Dr. Jim Like when he had teeth pulled. I called the patient and he stated he did not need pain medicine and it was a misunderstanding with [facility].

## 2011-03-03 ENCOUNTER — Telehealth: Payer: Self-pay | Admitting: *Deleted

## 2011-03-03 NOTE — Telephone Encounter (Signed)
His sister anwered the phone. Told her to tell him his rx was ready. She will relay the message. She asked about the derm referral from may & an ortho referral. Derm is in chart. I called [facility], [phone] & spoke with [place]. Gave her all info. She will call back later today after nurse reviews this & will make an appt most likely next week. Will contact pt when appt is made.  No mention of ortho referral at [facility]. Will ask provider next week when he returns & will f/u if one is made

## 2011-03-03 NOTE — Telephone Encounter (Signed)
Alesia from [employer] called for more info. States she will calllater today for the appt day & time. Told her to give the info to who ever answers & they will notify the pt

## 2011-03-03 NOTE — Telephone Encounter (Signed)
He picked up the rx. Also wanted copy of his most recent labs. Gave them to him. Told him I will be getting a call today or next week about when his derm appt will be. Will speak with NP about referral for c/o legs swelling. He has medicaid so getting an appt should not be a problem

## 2011-03-03 NOTE — Telephone Encounter (Signed)
He picked it up

## 2011-03-08 ENCOUNTER — Encounter (INDEPENDENT_AMBULATORY_CARE_PROVIDER_SITE_OTHER): Payer: Medicaid Other | Admitting: General Surgery

## 2011-03-14 ENCOUNTER — Ambulatory Visit (INDEPENDENT_AMBULATORY_CARE_PROVIDER_SITE_OTHER): Payer: Self-pay | Admitting: Adult Health

## 2011-03-14 ENCOUNTER — Other Ambulatory Visit: Payer: Self-pay | Admitting: *Deleted

## 2011-03-14 ENCOUNTER — Encounter: Payer: Self-pay | Admitting: Adult Health

## 2011-03-14 VITALS — BP 143/90 | HR 75 | Temp 98.0°F | Wt 213.4 lb

## 2011-03-14 DIAGNOSIS — B20 Human immunodeficiency virus [HIV] disease: Secondary | ICD-10-CM

## 2011-03-14 DIAGNOSIS — R51 Headache: Secondary | ICD-10-CM

## 2011-03-14 MED ORDER — RIZATRIPTAN BENZOATE 10 MG PO TABS: 10.0000 mg | ORAL_TABLET | ORAL | Status: DC | PRN

## 2011-03-14 NOTE — Progress Notes (Signed)
  Subjective:    Patient ID: Casey Brennan, male    DOB: [birth year], 50 y.o.   MRN: [id number]  HPI Presents to clinic with complaint of ongoing, chronic headaches, which are minimally relieved with sumatriptan therapy. Continues to have, tinnitus every time headaches occur which now appear to be daily. No nausea, vomiting, syncope, dizziness, or seizures reported. Also asking about referral to orthopedics and physical therapy for treatment of his chronic knee pain.   Review of Systems  Constitutional: Positive for fatigue. Negative for fever, chills and diaphoresis.  HENT: Positive for tinnitus. Negative for congestion, rhinorrhea, sneezing, neck stiffness, dental problem, postnasal drip and sinus pressure.   Respiratory: Negative.   Cardiovascular: Negative.   Gastrointestinal: Negative.   Musculoskeletal: Positive for arthralgias.  Neurological: Positive for headaches. Negative for dizziness, tremors, seizures, syncope, speech difficulty, weakness, light-headedness and numbness.  Hematological: Negative.   Psychiatric/Behavioral: Negative.        Objective:   Physical Exam  Constitutional: He is oriented to person, place, and time. He appears well-developed and well-nourished.       Appears in moderate discomfort  HENT:  Head: Normocephalic and atraumatic.  Right Ear: External ear normal.  Left Ear: External ear normal.  Nose: Nose normal.  Mouth/Throat: Oropharynx is clear and moist.  Eyes: Conjunctivae and EOM are normal. Pupils are equal, round, and reactive to light.  Neck: Normal range of motion. Neck supple. No tracheal deviation present.  Cardiovascular: Normal rate and regular rhythm.   Pulmonary/Chest: Effort normal and breath sounds normal.  Abdominal: Soft. Bowel sounds are normal.  Musculoskeletal:       Stiffness to knees, persist  Lymphadenopathy:    He has no cervical adenopathy.  Neurological: He is alert and oriented to person, place, and time. He has  normal reflexes. No cranial nerve deficit. He exhibits normal muscle tone. Coordination normal.  Skin: Skin is warm and dry.  Psychiatric: He has a normal mood and affect. His behavior is normal. Judgment and thought content normal.          Assessment & Plan:  1. Chronic Headaches. Remains uncertain what the etiology of these headaches are. We will repeat a serum toxoplasmosis, IgM, and a cryptococcal antigen to be certain these are not contributing to the problem. We will refer to a neurologist for evaluation to further determine possible etiology and recommendations and management. Meanwhile, we will also order her Maxalt 10 mg by mouth for headache, which may be repeated 2 hours after first dose.  2. Chronic Knee Pain. While this remains to be a chronic problem, at present, problem. #1 needs to be addressed first before we will refer to orthopedics for evaluation. Once neurology has made the appropriate evaluation, and has recommended appropriate intervention, we will refer to orthopedics.  Both he and his sister. Verbally acknowledged all information that was provided to him and agreed with plan of care.

## 2011-03-15 LAB — TOXOPLASMA GONDII ANTIBODY, IGM: Toxoplasma Antibody- IgM: 0.4 IV (ref <0.90)

## 2011-03-15 LAB — CRYPTOCOCCAL ANTIGEN: Crypto Ag: NEGATIVE

## 2011-03-17 ENCOUNTER — Other Ambulatory Visit: Payer: Self-pay | Admitting: Internal Medicine

## 2011-03-20 ENCOUNTER — Telehealth: Payer: Self-pay | Admitting: *Deleted

## 2011-03-20 NOTE — Telephone Encounter (Signed)
This medication does not appear on the pt's currently medication list.  Please advise.  Jennet Maduro, RN

## 2011-03-20 NOTE — Telephone Encounter (Signed)
vicoprofen denied by NP. Faxed it bACK to the pharmacy

## 2011-03-22 ENCOUNTER — Other Ambulatory Visit: Payer: Self-pay | Admitting: Internal Medicine

## 2011-03-22 ENCOUNTER — Telehealth: Payer: Self-pay | Admitting: Internal Medicine

## 2011-03-22 ENCOUNTER — Other Ambulatory Visit: Payer: Self-pay | Admitting: *Deleted

## 2011-03-22 DIAGNOSIS — R51 Headache: Secondary | ICD-10-CM

## 2011-03-22 MED ORDER — SUMATRIPTAN SUCCINATE 100 MG PO TABS: 100.0000 mg | ORAL_TABLET | Freq: Once | ORAL | Status: DC | PRN

## 2011-03-22 NOTE — Telephone Encounter (Signed)
Call placed to [facility] to inform them the Casey Brennan now has Medicaid.  That did not make a difference due to being income based.  At this time it was time to place an order for a refill.  That order # is [id number] and will be released on 9/21 and will take 7 - 10 business days for delivery to MD office.

## 2011-03-22 NOTE — Telephone Encounter (Signed)
Called Casey Brennan to find out if he is getting any of the meds from [facility] through Medicaid now.  This is in order to determine what applications will need to be renewed when the time comes.

## 2011-03-27 ENCOUNTER — Other Ambulatory Visit: Payer: Self-pay | Admitting: *Deleted

## 2011-03-27 DIAGNOSIS — B20 Human immunodeficiency virus [HIV] disease: Secondary | ICD-10-CM

## 2011-03-27 MED ORDER — DAPSONE 100 MG PO TABS: 100.0000 mg | ORAL_TABLET | Freq: Every day | ORAL | Status: DC

## 2011-03-27 MED ORDER — AZITHROMYCIN 600 MG PO TABS: 1200.0000 mg | ORAL_TABLET | ORAL | Status: DC

## 2011-03-27 NOTE — Telephone Encounter (Signed)
Sister states he did go to neurology. md gave him zanaflex for his headaches.  We have a fax asking for prior auth from [facility] for his sumatriptin. Sister states it really helps. I will prep the prior auth & send in

## 2011-03-27 NOTE — Telephone Encounter (Signed)
Faxed prior auth to [facility] for sumatriptan

## 2011-03-27 NOTE — Telephone Encounter (Signed)
Medicaid requesting prior authorization.  Ledell Peoples, RN to follow-up with neurologist referral for headaches prior to completing authorization.  Jennet Maduro, RN

## 2011-03-28 ENCOUNTER — Other Ambulatory Visit: Payer: Self-pay | Admitting: *Deleted

## 2011-03-28 DIAGNOSIS — R51 Headache: Secondary | ICD-10-CM

## 2011-03-28 MED ORDER — HYDROCODONE-IBUPROFEN 7.5-200 MG PO TABS: 1.0000 | ORAL_TABLET | Freq: Four times a day (QID) | ORAL | Status: DC | PRN

## 2011-03-28 NOTE — Telephone Encounter (Signed)
M,edicaid approved the sumatriptan for 1 year

## 2011-03-30 ENCOUNTER — Telehealth: Payer: Self-pay | Admitting: *Deleted

## 2011-03-30 NOTE — Telephone Encounter (Signed)
Returned call to patient in reference to his Vicoprofen Rx. Advised patient that Rx is ready but this will be the last one our office will fill as it was originally intended to help with the pain until the Neurology visit and assessment. Advised that his neurologist would need to fill it in the future if she wants to continue him on the med. Patient said he understands the instruction.

## 2011-04-03 ENCOUNTER — Other Ambulatory Visit: Payer: Self-pay | Admitting: *Deleted

## 2011-04-03 ENCOUNTER — Telehealth: Payer: Self-pay | Admitting: *Deleted

## 2011-04-03 DIAGNOSIS — L738 Other specified follicular disorders: Secondary | ICD-10-CM

## 2011-04-03 DIAGNOSIS — D7218 Other specified follicular disorders: Secondary | ICD-10-CM

## 2011-04-03 MED ORDER — HYDROXYZINE PAMOATE 25 MG PO CAPS: ORAL_CAPSULE | ORAL | Status: DC

## 2011-04-03 NOTE — Telephone Encounter (Signed)
LM that samples are in & ready to be picked up when able

## 2011-04-03 NOTE — Telephone Encounter (Signed)
Lot [id number] & exp 07/2015

## 2011-04-05 ENCOUNTER — Other Ambulatory Visit: Payer: Self-pay | Admitting: *Deleted

## 2011-04-05 NOTE — Telephone Encounter (Signed)
Pt picked up one bottle [facility] rx.  Jennet Maduro, RN

## 2011-04-10 ENCOUNTER — Emergency Department (HOSPITAL_COMMUNITY): Payer: Medicaid Other

## 2011-04-10 ENCOUNTER — Emergency Department (HOSPITAL_COMMUNITY)
Admission: EM | Admit: 2011-04-10 | Discharge: 2011-04-11 | Disposition: A | Discharge status: Home / Self Care | Payer: Medicaid Other | Attending: Emergency Medicine | Admitting: Emergency Medicine

## 2011-04-10 DIAGNOSIS — R05 Cough: Secondary | ICD-10-CM | POA: Insufficient documentation

## 2011-04-10 DIAGNOSIS — R51 Headache: Secondary | ICD-10-CM | POA: Insufficient documentation

## 2011-04-10 DIAGNOSIS — Z21 Asymptomatic human immunodeficiency virus [HIV] infection status: Secondary | ICD-10-CM | POA: Insufficient documentation

## 2011-04-10 DIAGNOSIS — M542 Cervicalgia: Secondary | ICD-10-CM | POA: Insufficient documentation

## 2011-04-10 DIAGNOSIS — R509 Fever, unspecified: Secondary | ICD-10-CM | POA: Insufficient documentation

## 2011-04-10 DIAGNOSIS — J45909 Unspecified asthma, uncomplicated: Secondary | ICD-10-CM | POA: Insufficient documentation

## 2011-04-10 DIAGNOSIS — R059 Cough, unspecified: Secondary | ICD-10-CM | POA: Insufficient documentation

## 2011-04-10 DIAGNOSIS — R42 Dizziness and giddiness: Secondary | ICD-10-CM | POA: Insufficient documentation

## 2011-04-10 DIAGNOSIS — Z79899 Other long term (current) drug therapy: Secondary | ICD-10-CM | POA: Insufficient documentation

## 2011-04-10 DIAGNOSIS — R0602 Shortness of breath: Secondary | ICD-10-CM | POA: Insufficient documentation

## 2011-04-10 LAB — BASIC METABOLIC PANEL
BUN: 10 mg/dL (ref 6–23)
CO2: 25 mEq/L (ref 19–32)
Calcium: 9.2 mg/dL (ref 8.4–10.5)
Chloride: 99 mEq/L (ref 96–112)
Creatinine, Ser: 1.51 mg/dL — ABNORMAL HIGH (ref 0.50–1.35)
GFR calc Af Amer: 62 mL/min — ABNORMAL LOW (ref >90)
GFR calc non Af Amer: 53 mL/min — ABNORMAL LOW (ref >90)
Glucose, Bld: 91 mg/dL (ref 70–99)
Potassium: 4.1 mEq/L (ref 3.5–5.1)
Sodium: 133 mEq/L — ABNORMAL LOW (ref 135–145)

## 2011-04-10 LAB — CBC
HCT: 34.2 % — ABNORMAL LOW (ref 39.0–52.0)
Hemoglobin: 11.5 g/dL — ABNORMAL LOW (ref 13.0–17.0)
MCH: 27.6 pg (ref 26.0–34.0)
MCHC: 33.6 g/dL (ref 30.0–36.0)
MCV: 82.2 fL (ref 78.0–100.0)
Platelets: 171 10*3/uL (ref 150–400)
RBC: 4.16 MIL/uL — ABNORMAL LOW (ref 4.22–5.81)
RDW: 15.7 % — ABNORMAL HIGH (ref 11.5–15.5)
WBC: 5.9 10*3/uL (ref 4.0–10.5)

## 2011-04-10 LAB — DIFFERENTIAL
Basophils Absolute: 0.1 10*3/uL (ref 0.0–0.1)
Basophils Relative: 1 % (ref 0–1)
Eosinophils Absolute: 0.6 10*3/uL (ref 0.0–0.7)
Eosinophils Relative: 10 % — ABNORMAL HIGH (ref 0–5)
Lymphocytes Relative: 43 % (ref 12–46)
Lymphs Abs: 2.5 10*3/uL (ref 0.7–4.0)
Monocytes Absolute: 0.4 10*3/uL (ref 0.1–1.0)
Monocytes Relative: 7 % (ref 3–12)
Neutro Abs: 2.3 10*3/uL (ref 1.7–7.7)
Neutrophils Relative %: 40 % — ABNORMAL LOW (ref 43–77)

## 2011-04-11 LAB — URINALYSIS, ROUTINE W REFLEX MICROSCOPIC
Bilirubin Urine: NEGATIVE
Glucose, UA: NEGATIVE mg/dL
Hgb urine dipstick: NEGATIVE
Ketones, ur: NEGATIVE mg/dL
Leukocytes, UA: NEGATIVE
Nitrite: NEGATIVE
Protein, ur: NEGATIVE mg/dL
Specific Gravity, Urine: 1.008 (ref 1.005–1.030)
Urobilinogen, UA: 0.2 mg/dL (ref 0.0–1.0)
pH: 5 (ref 5.0–8.0)

## 2011-04-11 MED ORDER — IOHEXOL 300 MG/ML  SOLN
100.0000 mL | Freq: Once | INTRAMUSCULAR | Status: AC | PRN
  Administered 2011-04-11: 100 mL via INTRAVENOUS

## 2011-04-12 ENCOUNTER — Other Ambulatory Visit: Payer: Self-pay | Admitting: *Deleted

## 2011-04-12 DIAGNOSIS — F0630 Mood disorder due to known physiological condition, unspecified: Secondary | ICD-10-CM

## 2011-04-12 MED ORDER — ALPRAZOLAM 1 MG PO TABS: 1.0000 mg | ORAL_TABLET | Freq: Every evening | ORAL | Status: DC | PRN

## 2011-04-17 ENCOUNTER — Encounter: Payer: Self-pay | Admitting: Infectious Disease

## 2011-04-17 ENCOUNTER — Ambulatory Visit (INDEPENDENT_AMBULATORY_CARE_PROVIDER_SITE_OTHER): Payer: Medicaid Other | Admitting: Infectious Disease

## 2011-04-17 VITALS — BP 144/96 | HR 87 | Temp 98.2°F | Wt 216.0 lb

## 2011-04-17 DIAGNOSIS — N179 Acute kidney failure, unspecified: Secondary | ICD-10-CM | POA: Insufficient documentation

## 2011-04-17 DIAGNOSIS — N289 Disorder of kidney and ureter, unspecified: Secondary | ICD-10-CM

## 2011-04-17 DIAGNOSIS — B20 Human immunodeficiency virus [HIV] disease: Secondary | ICD-10-CM

## 2011-04-17 DIAGNOSIS — I10 Essential (primary) hypertension: Secondary | ICD-10-CM | POA: Insufficient documentation

## 2011-04-17 DIAGNOSIS — R51 Headache: Secondary | ICD-10-CM

## 2011-04-17 DIAGNOSIS — R809 Proteinuria, unspecified: Secondary | ICD-10-CM | POA: Insufficient documentation

## 2011-04-17 LAB — CBC WITH DIFFERENTIAL/PLATELET
Basophils Absolute: 0.1 10*3/uL (ref 0.0–0.1)
Basophils Relative: 1 % (ref 0–1)
Eosinophils Absolute: 0.5 10*3/uL (ref 0.0–0.7)
Eosinophils Relative: 8 % — ABNORMAL HIGH (ref 0–5)
HCT: 34.4 % — ABNORMAL LOW (ref 39.0–52.0)
Hemoglobin: 11.4 g/dL — ABNORMAL LOW (ref 13.0–17.0)
Lymphocytes Relative: 43 % (ref 12–46)
Lymphs Abs: 2.7 10*3/uL (ref 0.7–4.0)
MCH: 27.9 pg (ref 26.0–34.0)
MCHC: 33.1 g/dL (ref 30.0–36.0)
MCV: 84.3 fL (ref 78.0–100.0)
Monocytes Absolute: 0.5 10*3/uL (ref 0.1–1.0)
Monocytes Relative: 7 % (ref 3–12)
Neutro Abs: 2.6 10*3/uL (ref 1.7–7.7)
Neutrophils Relative %: 41 % — ABNORMAL LOW (ref 43–77)
Platelets: 263 10*3/uL (ref 150–400)
RBC: 4.08 MIL/uL — ABNORMAL LOW (ref 4.22–5.81)
RDW: 17.5 % — ABNORMAL HIGH (ref 11.5–15.5)
WBC: 6.4 10*3/uL (ref 4.0–10.5)

## 2011-04-17 LAB — COMPLETE METABOLIC PANEL WITH GFR
ALT: 20 U/L (ref 0–53)
AST: 24 U/L (ref 0–37)
Albumin: 4.7 g/dL (ref 3.5–5.2)
Alkaline Phosphatase: 105 U/L (ref 39–117)
BUN: 16 mg/dL (ref 6–23)
CO2: 29 mEq/L (ref 19–32)
Calcium: 9.9 mg/dL (ref 8.4–10.5)
Chloride: 101 mEq/L (ref 96–112)
Creat: 1.48 mg/dL — ABNORMAL HIGH (ref 0.50–1.35)
GFR, Est African American: 64 mL/min — ABNORMAL LOW (ref >90)
GFR, Est Non African American: 56 mL/min — ABNORMAL LOW (ref >90)
Glucose, Bld: 94 mg/dL (ref 70–99)
Potassium: 4.5 mEq/L (ref 3.5–5.3)
Sodium: 137 mEq/L (ref 135–145)
Total Bilirubin: 0.7 mg/dL (ref 0.3–1.2)
Total Protein: 8.4 g/dL — ABNORMAL HIGH (ref 6.0–8.3)

## 2011-04-17 LAB — SODIUM, URINE, RANDOM: Sodium, Ur: 62 mEq/L

## 2011-04-17 LAB — MICROALBUMIN / CREATININE URINE RATIO
Creatinine, Urine: 48.7 mg/dL
Microalb Creat Ratio: 40.2 mg/g — ABNORMAL HIGH (ref 0.0–30.0)
Microalb, Ur: 1.96 mg/dL — ABNORMAL HIGH (ref 0.00–1.89)

## 2011-04-17 LAB — PHOSPHORUS: Phosphorus: 3.1 mg/dL (ref 2.3–4.6)

## 2011-04-17 MED ORDER — OXYCODONE HCL 5 MG PO CAPS: 5.0000 mg | ORAL_CAPSULE | ORAL | Status: AC

## 2011-04-17 MED ORDER — VERAPAMIL HCL ER 100 MG PO CP24: 100.0000 mg | ORAL_CAPSULE | Freq: Every day | ORAL | Status: DC

## 2011-04-17 MED ORDER — TIZANIDINE HCL 4 MG PO CAPS: 8.0000 mg | ORAL_CAPSULE | Freq: Three times a day (TID) | ORAL | Status: DC

## 2011-04-17 NOTE — Assessment & Plan Note (Signed)
Check urine microalbumin and creatinine , urine na, creatinine. I suspect this is due to uncontrolled HTN. WIll also check an ha1c.

## 2011-04-17 NOTE — Assessment & Plan Note (Signed)
Pt refuses to be treated with verapamil or any agent, angry at being "labelled" as having htn. I explained i uderstood that his pain is causing his HTN but that he did nontheless have an elevated bp and that he verapamil might help his ha as well
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
## 2011-04-17 NOTE — Assessment & Plan Note (Signed)
I tried to give him prophylactic verapamil. He refused. I changed his vicoprofen to pure oxycodone to avoid risks of nephrotoxicity from NSAID. He will followup with Dr. Algis Downs at [facility]

## 2011-04-17 NOTE — Assessment & Plan Note (Signed)
Viral load has become controlled and CD4 count has come up to 180. I will recheck cd4 and viral load today. If cd4 above 200 would be comfortable dc dapsone. Will dc the azithromycin. I am concerned with worsening renal fxn and untreated HTN that we may have to change to the truvada to every other day or go away from a TNF regimen. I will also check an HLA [id number]. I would need to review past genotypes to be able to craft a newer regimen for him at this time

## 2011-04-18 LAB — T-HELPER CELL (CD4) - (RCID CLINIC ONLY)
CD4 % Helper T Cell: 10 % — ABNORMAL LOW (ref 33–55)
CD4 T Cell Abs: 270 uL — ABNORMAL LOW (ref 400–2700)

## 2011-04-24 ENCOUNTER — Other Ambulatory Visit: Payer: Self-pay | Admitting: *Deleted

## 2011-04-24 DIAGNOSIS — J309 Allergic rhinitis, unspecified: Secondary | ICD-10-CM

## 2011-04-24 DIAGNOSIS — D649 Anemia, unspecified: Secondary | ICD-10-CM

## 2011-04-24 DIAGNOSIS — J45909 Unspecified asthma, uncomplicated: Secondary | ICD-10-CM

## 2011-04-24 LAB — HIV-1 RNA QUANT-NO REFLEX-BLD
HIV 1 RNA Quant: 58 copies/mL — ABNORMAL HIGH (ref <20)
HIV-1 RNA Quant, Log: 1.76 {Log} — ABNORMAL HIGH (ref <1.30)

## 2011-04-24 MED ORDER — FOLIC ACID 1 MG PO TABS: 1.0000 mg | ORAL_TABLET | Freq: Every day | ORAL | Status: DC

## 2011-04-24 MED ORDER — MOMETASONE FUROATE 50 MCG/ACT NA SUSP: 2.0000 | Freq: Every day | NASAL | Status: DC

## 2011-04-24 MED ORDER — BUDESONIDE-FORMOTEROL FUMARATE 80-4.5 MCG/ACT IN AERO: 2.0000 | INHALATION_SPRAY | Freq: Two times a day (BID) | RESPIRATORY_TRACT | Status: DC

## 2011-04-24 MED ORDER — ALBUTEROL SULFATE HFA 108 (90 BASE) MCG/ACT IN AERS: 1.0000 | INHALATION_SPRAY | RESPIRATORY_TRACT | Status: DC | PRN

## 2011-04-24 NOTE — Telephone Encounter (Signed)
They came in asking for pap refills. Told pt & his sister that since he has medicaid now, he is no longer eligible for pap. I sent in refills for his meds.

## 2011-04-24 NOTE — Telephone Encounter (Signed)
He now has an appt with the internal medicine clinic. They will be his pcp

## 2011-04-24 NOTE — Telephone Encounter (Signed)
He wants [place] to make him an appt with Dr. Ulyess Mort so he can have a PCP. Also wants a referral to the Headache wellness center with Dr. Santiago Glad. Has been seen at [facility]. Uncertain if he needs the headache center plus neuro. To md for response. States HA are not improved Missed appt for derm. States he did not get a call about this so missed it. I told him he can call & re-schedule.

## 2011-04-27 LAB — HLA B*5701: HLA-B*5701: NEGATIVE

## 2011-05-08 ENCOUNTER — Telehealth: Payer: Self-pay | Admitting: *Deleted

## 2011-05-08 NOTE — Telephone Encounter (Signed)
rec'd a call from April, his sister asking that Dr. Ninetta Lights call her asap. Discussed with other rn. Message sent to md

## 2011-05-11 ENCOUNTER — Ambulatory Visit (INDEPENDENT_AMBULATORY_CARE_PROVIDER_SITE_OTHER): Payer: Medicaid Other | Admitting: Ophthalmology

## 2011-05-11 ENCOUNTER — Encounter: Payer: Self-pay | Admitting: Ophthalmology

## 2011-05-11 DIAGNOSIS — R51 Headache: Secondary | ICD-10-CM

## 2011-05-11 DIAGNOSIS — G4700 Insomnia, unspecified: Secondary | ICD-10-CM | POA: Insufficient documentation

## 2011-05-11 DIAGNOSIS — F0630 Mood disorder due to known physiological condition, unspecified: Secondary | ICD-10-CM

## 2011-05-11 DIAGNOSIS — I10 Essential (primary) hypertension: Secondary | ICD-10-CM

## 2011-05-11 MED ORDER — ZOLPIDEM TARTRATE 10 MG PO TABS: ORAL_TABLET | ORAL | Status: DC

## 2011-05-11 MED ORDER — HYDROCODONE-ACETAMINOPHEN 5-500 MG PO TABS: 1.0000 | ORAL_TABLET | Freq: Four times a day (QID) | ORAL | Status: DC | PRN

## 2011-05-11 MED ORDER — ZOLPIDEM TARTRATE ER 12.5 MG PO TBCR: 12.5000 mg | EXTENDED_RELEASE_TABLET | Freq: Every evening | ORAL | Status: DC | PRN

## 2011-05-11 MED ORDER — TIZANIDINE HCL 4 MG PO CAPS: ORAL_CAPSULE | ORAL | Status: DC

## 2011-05-11 NOTE — Assessment & Plan Note (Signed)
Patient gets relief from muscle relaxant medication Zanaflex and aleeve and felt nauseous with oxycodone so we will continue the zanaflex and aleeve and prescribe vicodin. He was previously on vicoprofen which was stopped due to renal insufficiency.
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
## 2011-05-11 NOTE — Assessment & Plan Note (Signed)
Patient was taking xanax for sleep, asked him to try Remus Loffler since it is less addictive. Ambien 10mg  not CR is covered by medicare so changed prescription.

## 2011-05-11 NOTE — Assessment & Plan Note (Addendum)
Patient continues to be mildly hypertensive. Sister is very unhappy with idea of labeling him with hypertension while he is having pain. He is only having 4/10 pain currently and is still hypertensive. Will not treat currently but will approach at next visit. Patient has also gained a considerable amount of weight which may contribute to his hypertension.

## 2011-05-11 NOTE — Patient Instructions (Signed)
Let us know the ambien and vicodin work for pain and sleep.

## 2011-05-12 IMAGING — CT CT HEAD W/O CM
1 series · 16 of 30 positions shown, 20 images · non-contrast
Comparison: Brain MR dated 08/26/2010 and head CT dated 08/24/2010.

CLINICAL DATA: Headache.  Status post concussion in [REDACTED] of this
year.

CT HEAD WITHOUT CONTRAST
TECHNIQUE: Contiguous axial images were obtained from the base of
the skull through the vertex without contrast.

[Series 2: head_seq 4.5 h37s st · axial · 0.46mm/px · z∈[+1166,+1310]mm · 16 of 36 slices shown, 20 images]
[im 2/36  brain]
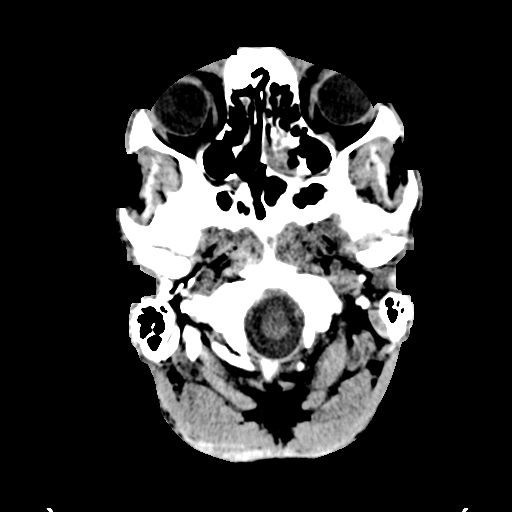
[im 2/36  bone]
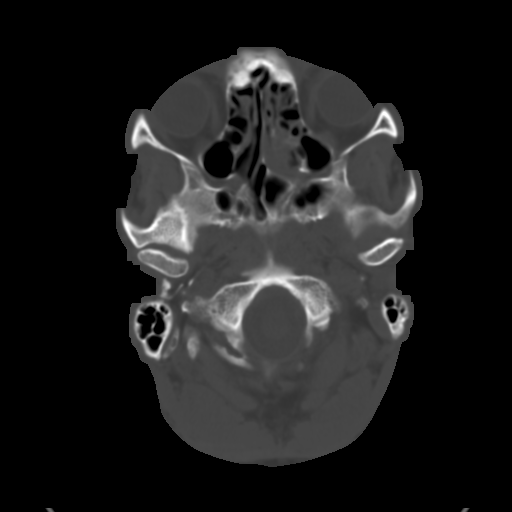
[im 4/36  brain]
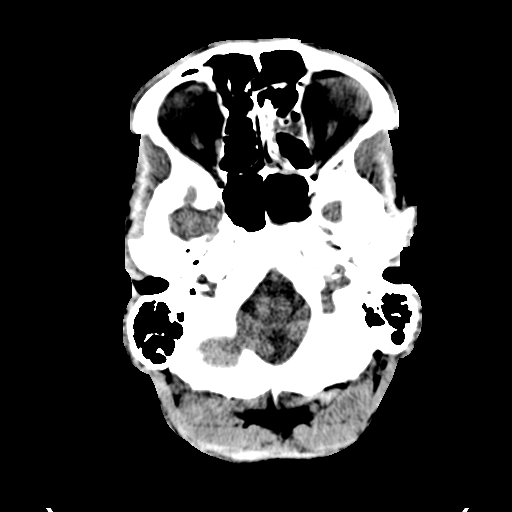
[im 7/36  brain]
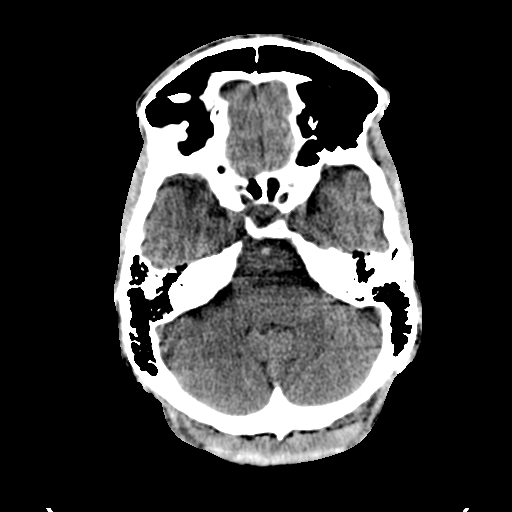
[im 9/36  brain]
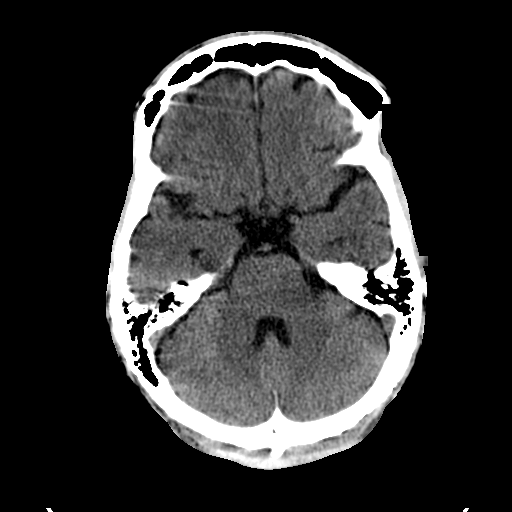
[im 10/36  brain]
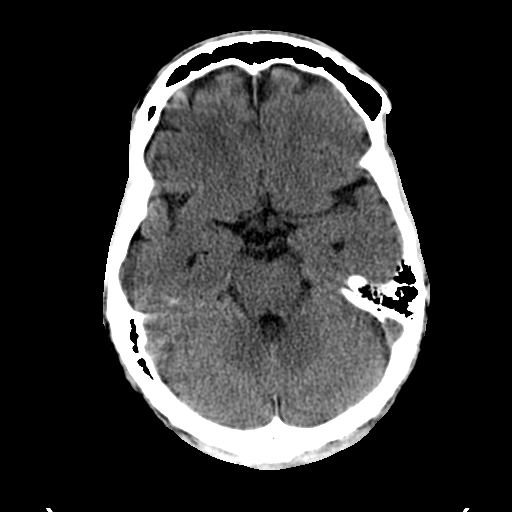
[im 10/36  bone]
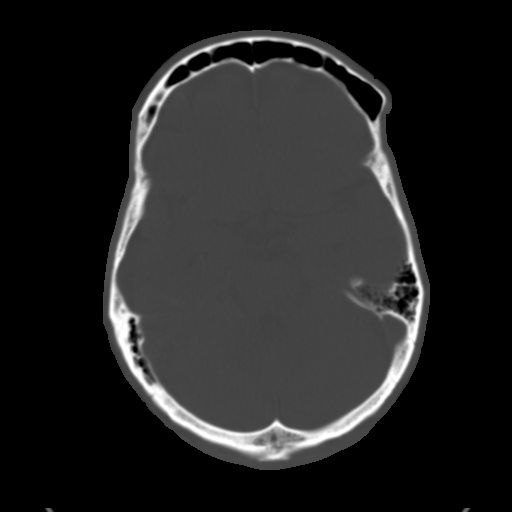
[im 13/36  brain]
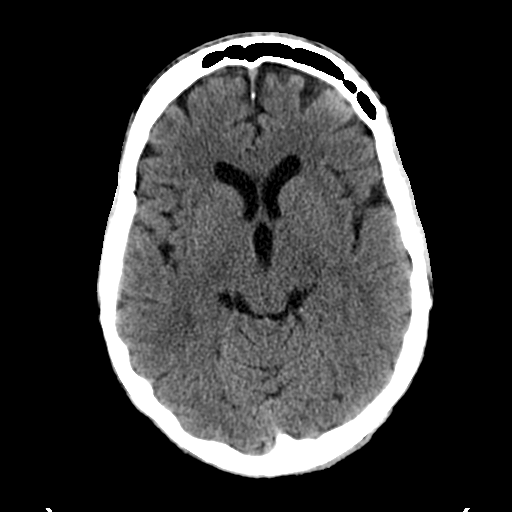
[im 15/36  brain]
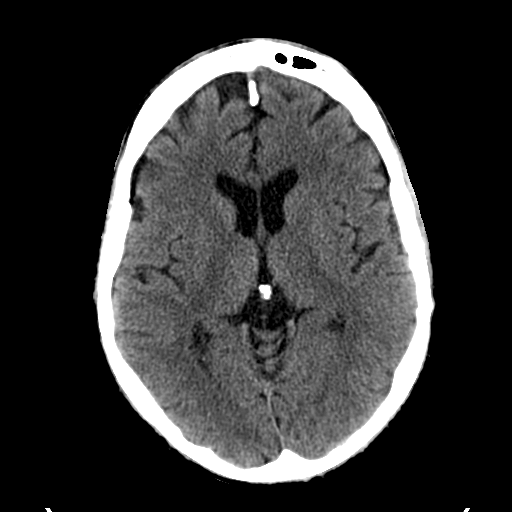
[im 17/36  brain]
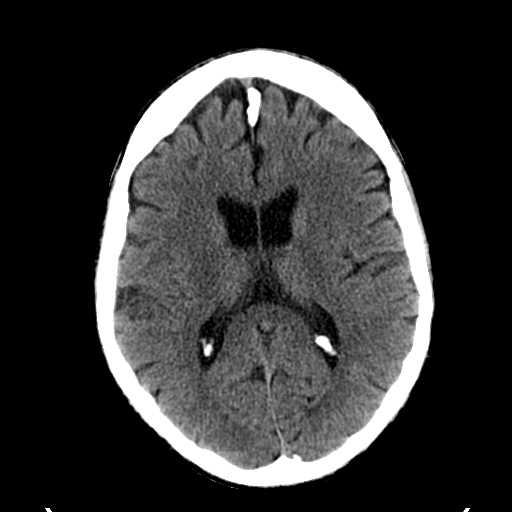
[im 19/36  brain]
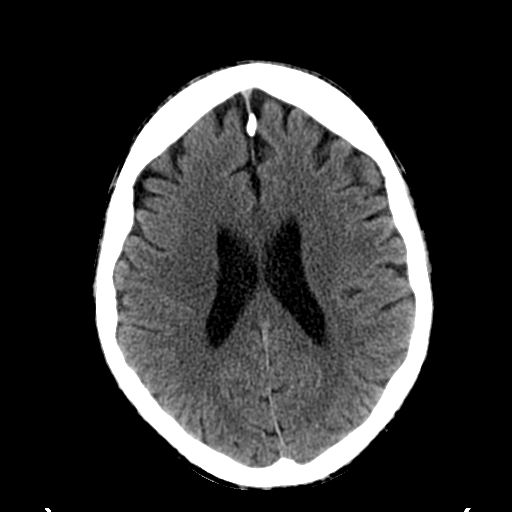
[im 19/36  bone]
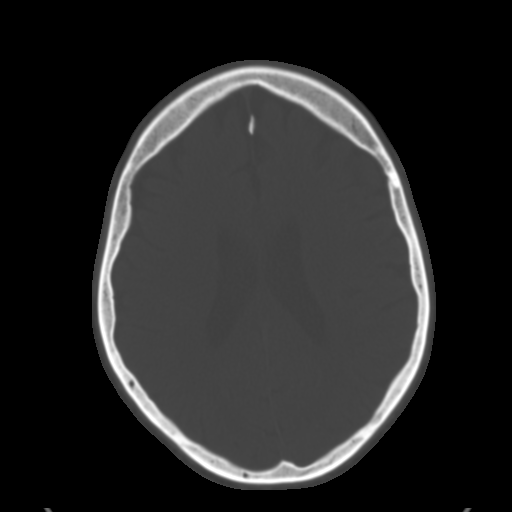
[im 21/36  brain]
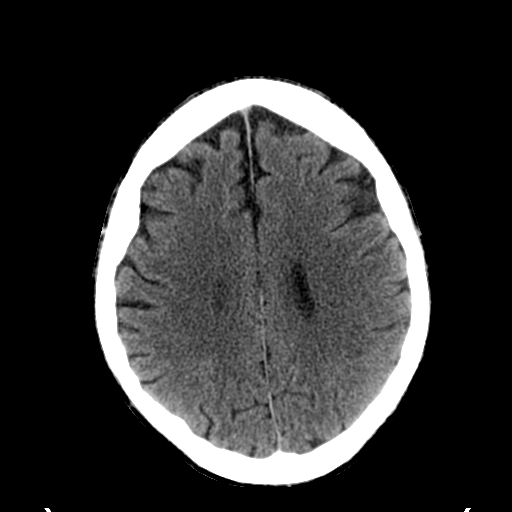
[im 23/36  brain]
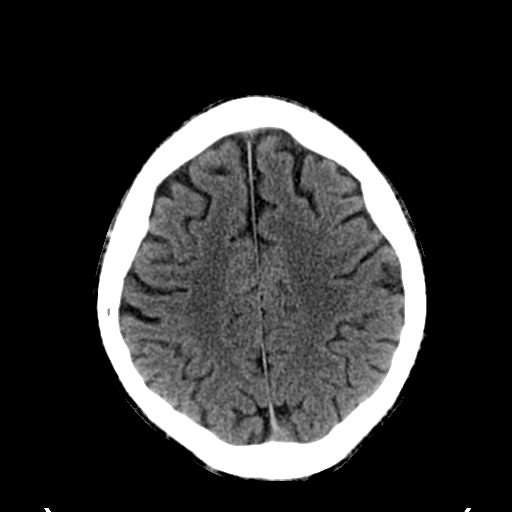
[im 26/36  brain]
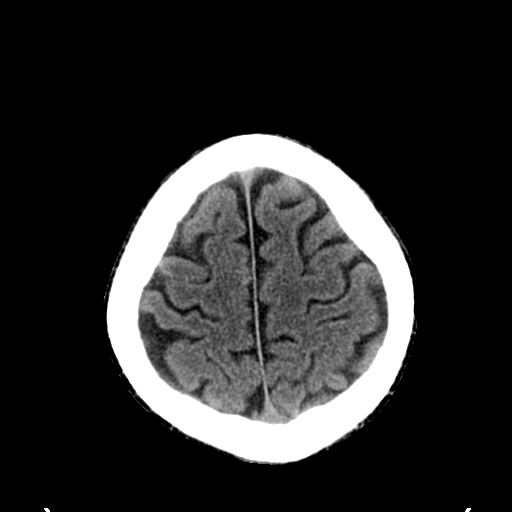
[im 27/36  brain]
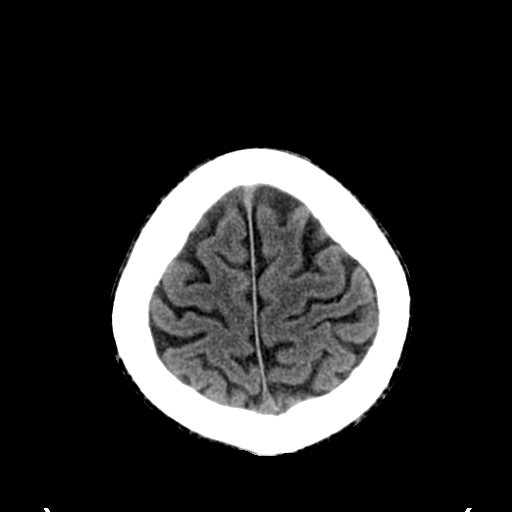
[im 27/36  bone]
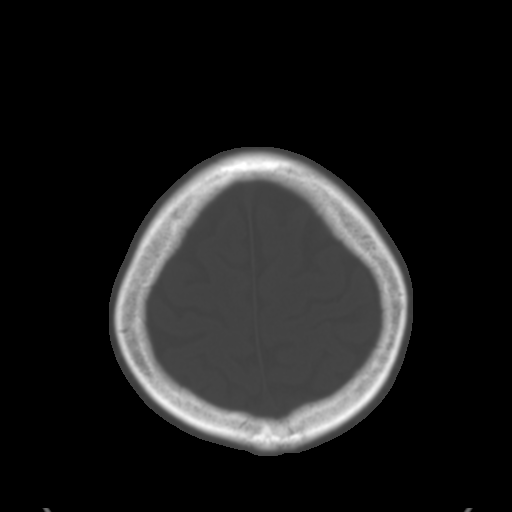
[im 29/36  brain]
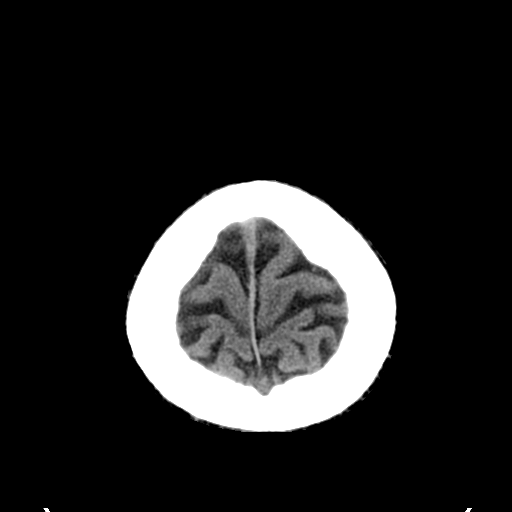
[im 32/36  brain]
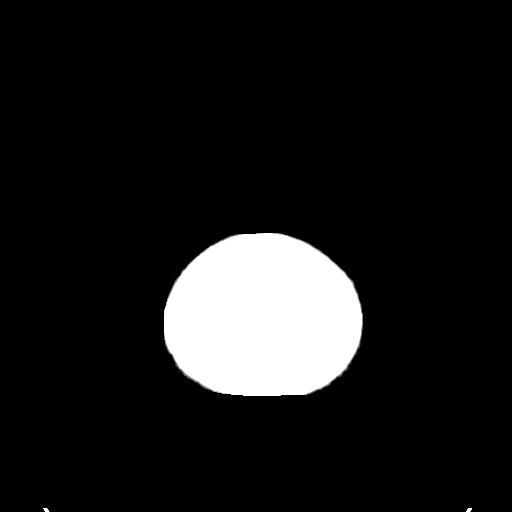
[im 34/36  brain]
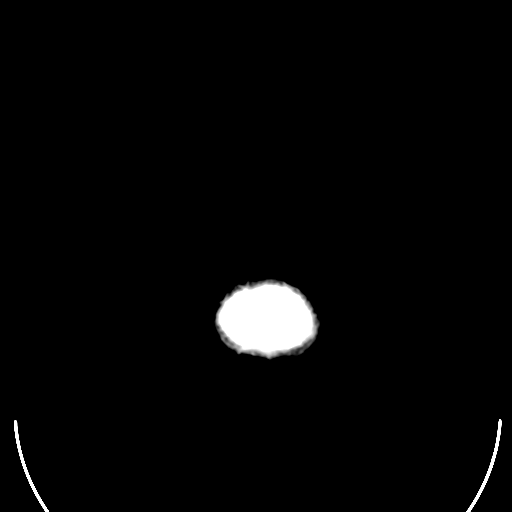

[16 of 30 positions shown; findings below may reference images not displayed]

FINDINGS: No significant change in diffuse enlargement of the
ventricles and subarachnoid spaces.  No intracranial hemorrhage,
mass lesion or CT evidence of acute infarction.  No fractures.
Interval extensive left ethmoid sinus mucosal thickening and lesser
degree of right ethmoid sinus mucosal thickening.  Mild left
ethmoid sinus mucosal thickening and small amount of fluid.
IMPRESSION: 1.  Stable atrophy.
2.  No acute intracranial abnormality.
3.  Chronic bilateral ethmoid sinusitis and acute left sphenoid
sinusitis.

## 2011-05-17 ENCOUNTER — Other Ambulatory Visit: Payer: Self-pay | Admitting: *Deleted

## 2011-05-17 DIAGNOSIS — B20 Human immunodeficiency virus [HIV] disease: Secondary | ICD-10-CM

## 2011-05-17 MED ORDER — RITONAVIR 100 MG PO CAPS: 100.0000 mg | ORAL_CAPSULE | Freq: Every day | ORAL | Status: DC

## 2011-05-17 MED ORDER — DARUNAVIR ETHANOLATE 400 MG PO TABS: 800.0000 mg | ORAL_TABLET | Freq: Every day | ORAL | Status: DC

## 2011-05-17 MED ORDER — EMTRICITABINE-TENOFOVIR DF 200-300 MG PO TABS: 1.0000 | ORAL_TABLET | Freq: Every day | ORAL | Status: DC

## 2011-05-17 MED ORDER — RALTEGRAVIR POTASSIUM 400 MG PO TABS: 400.0000 mg | ORAL_TABLET | Freq: Two times a day (BID) | ORAL | Status: DC

## 2011-05-17 NOTE — Telephone Encounter (Signed)
Pt's sister called to get his meds refilled. States he is out. She also asked that Dr. Ninetta Lights call her. Pt has been to internal Medicine for his 1st appt & he is pleased with them

## 2011-05-22 ENCOUNTER — Ambulatory Visit: Payer: Medicaid Other | Admitting: Adult Health

## 2011-05-27 ENCOUNTER — Encounter (HOSPITAL_COMMUNITY): Payer: Self-pay | Admitting: *Deleted

## 2011-05-27 ENCOUNTER — Telehealth: Payer: Self-pay | Admitting: Internal Medicine

## 2011-05-27 ENCOUNTER — Emergency Department (HOSPITAL_COMMUNITY)
Admission: EM | Admit: 2011-05-27 | Discharge: 2011-05-28 | Disposition: A | Discharge status: Home / Self Care | Payer: Medicaid Other | Attending: Emergency Medicine | Admitting: Emergency Medicine

## 2011-05-27 DIAGNOSIS — R05 Cough: Secondary | ICD-10-CM

## 2011-05-27 DIAGNOSIS — J45909 Unspecified asthma, uncomplicated: Secondary | ICD-10-CM | POA: Insufficient documentation

## 2011-05-27 DIAGNOSIS — R61 Generalized hyperhidrosis: Secondary | ICD-10-CM | POA: Insufficient documentation

## 2011-05-27 DIAGNOSIS — R51 Headache: Secondary | ICD-10-CM | POA: Insufficient documentation

## 2011-05-27 DIAGNOSIS — R5381 Other malaise: Secondary | ICD-10-CM | POA: Insufficient documentation

## 2011-05-27 DIAGNOSIS — R6889 Other general symptoms and signs: Secondary | ICD-10-CM | POA: Insufficient documentation

## 2011-05-27 DIAGNOSIS — I10 Essential (primary) hypertension: Secondary | ICD-10-CM | POA: Insufficient documentation

## 2011-05-27 DIAGNOSIS — J3489 Other specified disorders of nose and nasal sinuses: Secondary | ICD-10-CM | POA: Insufficient documentation

## 2011-05-27 DIAGNOSIS — R110 Nausea: Secondary | ICD-10-CM | POA: Insufficient documentation

## 2011-05-27 DIAGNOSIS — R509 Fever, unspecified: Secondary | ICD-10-CM | POA: Insufficient documentation

## 2011-05-27 DIAGNOSIS — R5383 Other fatigue: Secondary | ICD-10-CM | POA: Insufficient documentation

## 2011-05-27 DIAGNOSIS — R059 Cough, unspecified: Secondary | ICD-10-CM | POA: Insufficient documentation

## 2011-05-27 DIAGNOSIS — R6883 Chills (without fever): Secondary | ICD-10-CM | POA: Insufficient documentation

## 2011-05-27 DIAGNOSIS — Z21 Asymptomatic human immunodeficiency virus [HIV] infection status: Secondary | ICD-10-CM | POA: Insufficient documentation

## 2011-05-27 MED ORDER — ACETAMINOPHEN 325 MG PO TABS: 650.0000 mg | ORAL_TABLET | Freq: Once | ORAL | Status: DC

## 2011-05-27 MED ORDER — ACETAMINOPHEN 160 MG/5ML PO SOLN
ORAL | Status: AC
  Administered 2011-05-27: 650 mg via ORAL
  Filled 2011-05-27: qty 20.3

## 2011-05-27 MED ORDER — SODIUM CHLORIDE 0.9 % IV BOLUS (SEPSIS)
1000.0000 mL | Freq: Once | INTRAVENOUS | Status: AC
  Administered 2011-05-27: 1000 mL via INTRAVENOUS

## 2011-05-27 MED ORDER — ACETAMINOPHEN 160 MG/5ML PO SOLN
650.0000 mg | Freq: Once | ORAL | Status: AC
  Administered 2011-05-27: 650 mg via ORAL

## 2011-05-27 NOTE — ED Provider Notes (Signed)
History     CSN: [id number] Arrival date & time: 05/27/2011 10:50 PM   First MD Initiated Contact with Patient 05/27/11 2313      Chief Complaint  Patient presents with  . Fever    Patient is a 50 y.o. male presenting with fever.  Fever Primary symptoms of the febrile illness include fever, fatigue, headaches, cough and nausea. Primary symptoms do not include shortness of breath, abdominal pain, vomiting, diarrhea or dysuria.  The headache is not associated with weakness.    History provided by the patient and family. Patient with history of HIV, with last CD4 count of 270 and undetectable viral load, presents with complaints of fever for the past 2 days. He has associated chills and sweats, productive cough, nasal congestion, and slight scratchy/sore throat. Patient has taken over-the-counter cough and cold medication without improvement. He took an aspirin earlier today with some improvement of fever. Patient has been staying well hydrated but reports decreased appetite for solid foods. Patient reports some nausea but no vomiting. Symptoms seem to be gradually worsening. Pt denies chest pain or shortness of breath.    Past Medical History  Diagnosis Date  . HIV (human immunodeficiency virus infection)   . Pneumonia 07/12/10    SEPTIC SHOCK  . Severe frontal headaches 08/16/10  . Chest pain 07/12/10  . Cyst     left nipple  . Asthma   . Hypertension   . HIV (human immunodeficiency virus infection)     Past Surgical History  Procedure Date  . Upper leg soft tissue biopsy     left thigh  . Nipple biopsy     left  . Knee arthroscopy     right  . Acne cyst removal     Family History  Problem Relation Age of Onset  . Stroke Mother   . Cancer Mother   . Stroke Father   . Diabetes Father   . Hypertension Sister     History  Substance Use Topics  . Smoking status: Former Smoker    Quit date: 05/22/2010  . Smokeless tobacco: Never Used  . Alcohol Use: No     rare       Review of Systems  Constitutional: Positive for fever, chills, diaphoresis, activity change, appetite change and fatigue.  HENT: Positive for congestion, sore throat and rhinorrhea. Negative for ear pain.   Respiratory: Positive for cough. Negative for shortness of breath.   Cardiovascular: Negative for chest pain and palpitations.  Gastrointestinal: Positive for nausea. Negative for vomiting, abdominal pain, diarrhea and constipation.  Genitourinary: Negative for dysuria, frequency, hematuria, flank pain, discharge and testicular pain.  Neurological: Positive for headaches. Negative for syncope, weakness and light-headedness.  All other systems reviewed and are negative.    Allergies  Penicillins and Sulfonamide derivatives  Home Medications   Current Outpatient Rx  Name Route Sig Dispense Refill  . ALBUTEROL SULFATE HFA 108 (90 BASE) MCG/ACT IN AERS Inhalation Inhale 1-2 puffs into the lungs every 4 (four) hours as needed. 1 Inhaler 11  . ASPIRIN 325 MG PO TABS Oral Take 325 mg by mouth daily.      . BUDESONIDE-FORMOTEROL FUMARATE 80-4.5 MCG/ACT IN AERO Inhalation Inhale 2 puffs into the lungs 2 (two) times daily. 1 Inhaler 11  . DARUNAVIR ETHANOLATE 400 MG PO TABS Oral Take 2 tablets (800 mg total) by mouth daily. With Norvir and Truvada 60 tablet 3  . EMTRICITABINE-TENOFOVIR 200-300 MG PO TABS Oral Take 1 tablet by mouth daily.  30 tablet 3  . FOLIC ACID 1 MG PO TABS Oral Take 1 tablet (1 mg total) by mouth daily. 30 tablet 11  . HYDROCODONE-ACETAMINOPHEN 5-500 MG PO TABS Oral Take 1 tablet by mouth every 6 (six) hours as needed for pain. 120 tablet 0  . HYDROXYZINE PAMOATE 25 MG PO CAPS  One capsule by mouth every 6 hours when necessary 400 capsule 0    rec'd 4 bottles of 100 each vistaril. LM for pt th ...  . MUPIROCIN 2 % EX OINT Topical Apply 1 application topically 2 (two) times daily. 30 gram tube 66 g 3  . NAPROXEN SODIUM 220 MG PO TABS Oral Take 220 mg by mouth 2  (two) times daily with a meal.      . RALTEGRAVIR POTASSIUM 400 MG PO TABS Oral Take 1 tablet (400 mg total) by mouth 2 (two) times daily. 60 tablet 3  . RITONAVIR 100 MG PO CAPS Oral Take 1 capsule (100 mg total) by mouth daily. With Prezista and Truvada 30 capsule 3  . TIZANIDINE HCL 4 MG PO CAPS  Take 1.5 4mg  tablets twice a day (total of 12mg  a day) 90 capsule 1  . ZOLPIDEM TARTRATE ER 12.5 MG PO TBCR Oral Take 12.5 mg by mouth at bedtime as needed.        BP 160/88  Pulse 109  Temp(Src) 101.5 F (38.6 C) (Oral)  Resp 20  SpO2 92%  Physical Exam  Nursing note and vitals reviewed. Constitutional: He is oriented to person, place, and time. He appears well-developed and well-nourished. No distress.  HENT:  Head: Normocephalic and atraumatic.  Right Ear: External ear normal.  Mouth/Throat: Oropharynx is clear and moist.  Eyes: Conjunctivae and EOM are normal. Pupils are equal, round, and reactive to light.  Neck: Normal range of motion. Neck supple.  Cardiovascular: Normal rate and regular rhythm.  Exam reveals no friction rub.   No murmur heard. Pulmonary/Chest: Effort normal. He has no wheezes. He has no rales.  Abdominal: Soft. He exhibits no distension and no mass. There is no tenderness. There is no rebound, no guarding and no CVA tenderness.  Musculoskeletal: Normal range of motion.  Lymphadenopathy:    He has no cervical adenopathy.  Neurological: He is alert and oriented to person, place, and time.  Skin: Skin is warm and dry. No rash noted. No erythema.  Psychiatric: He has a normal mood and affect. His behavior is normal.    ED Course  Procedures (including critical care time)  Labs Reviewed  CBC - Abnormal; Notable for the following:    RBC 3.78 (*)    Hemoglobin 10.0 (*)    HCT 31.2 (*)    RDW 16.7 (*)    All other components within normal limits  DIFFERENTIAL  BUN  URINALYSIS, ROUTINE W REFLEX MICROSCOPIC  URINE CULTURE   Results for orders placed during  the hospital encounter of 05/27/11  CBC      Component Value Range   WBC 4.9  4.0 - 10.5 (K/uL)   RBC 3.78 (*) 4.22 - 5.81 (MIL/uL)   Hemoglobin 10.0 (*) 13.0 - 17.0 (g/dL)   HCT 86.5 (*) 78.4 - 52.0 (%)   MCV 82.5  78.0 - 100.0 (fL)   MCH 26.5  26.0 - 34.0 (pg)   MCHC 32.1  30.0 - 36.0 (g/dL)   RDW 69.6 (*) 29.5 - 15.5 (%)   Platelets 164  150 - 400 (K/uL)  DIFFERENTIAL  Component Value Range   Neutrophils Relative 67  43 - 77 (%)   Neutro Abs 3.3  1.7 - 7.7 (K/uL)   Lymphocytes Relative 23  12 - 46 (%)   Lymphs Abs 1.1  0.7 - 4.0 (K/uL)   Monocytes Relative 9  3 - 12 (%)   Monocytes Absolute 0.4  0.1 - 1.0 (K/uL)   Eosinophils Relative 1  0 - 5 (%)   Eosinophils Absolute 0.1  0.0 - 0.7 (K/uL)   Basophils Relative 0  0 - 1 (%)   Basophils Absolute 0.0  0.0 - 0.1 (K/uL)  BUN      Component Value Range   BUN 18  6 - 23 (mg/dL)     Dg Chest 2 View  16/04/9603  *RADIOLOGY REPORT*  Clinical Data: Fever, shortness of breath, chills and body aches. Nausea and headaches.  CHEST - 2 VIEW  Comparison: Chest radiograph performed 04/10/2011  Findings: The lungs are well-aerated.  Mild peribronchial thickening is noted.  There is no evidence of focal opacification, pleural effusion or pneumothorax.  Bilateral nipple shadows are noted.  The heart is normal in size; the mediastinal contour is within normal limits.  No acute osseous abnormalities are seen.  IMPRESSION: Mild peribronchial thickening noted; lungs otherwise clear.  Original Report Authenticated By: Tonia Ghent, M.D.     1. Fever   2. Cough       MDM  11:00 PM patient seen and evaluated. Patient in no acute distress.  12:30 AM spoke with outpatient clinic. They will come and see patient and evaluate for admission or possible discharge home in close followup in the office.    1:00 AM Dr. Eben Burow with outpatient clinic has seen patient and evaluated. He would like to give first dose of Avelox tonight with prescriptions  to go home on. Patient also requests prescription cough suppressant for symptoms. He will be seen in clinic on Monday for close followup. Patient agrees with this plan.  Angus Seller, [place] 05/28/11 [phone]

## 2011-05-27 NOTE — Telephone Encounter (Signed)
The patient's sister called on the emergency pager regarding her brother Casey Brennan. After confirming the date of birth she explained that Casey Brennan has been having fever, headaches and scratchy throat since last 2 days. She did not complain of any shortness of breath or altered mental status at this time. After looking at the medical records and reviewing his past medical history, I told her that he needs to be seen in the ER as given his underlying comorbidities and multiple medical problems this could be anything and I would not be able to prescribe anything over phone without evaluation. I did not tell her about his co-morbidities as I was not sure whether she knew about his HIV status and advised again to go to ER for evaluation. She told me that she knows about his HIV status and started refuting his other medical problems listed in the chart by saying that he does not have anything else and hung the phone up by saying that she will come to see Casey Brennan in the morning with his brother.

## 2011-05-27 NOTE — ED Notes (Signed)
Patient here with c/o fever that started on Thursday and been medication otc but not getting any better.  Patient also c/o his throat being scratchy

## 2011-05-28 ENCOUNTER — Emergency Department (HOSPITAL_COMMUNITY): Payer: Medicaid Other

## 2011-05-28 LAB — DIFFERENTIAL
Basophils Absolute: 0 10*3/uL (ref 0.0–0.1)
Basophils Relative: 0 % (ref 0–1)
Eosinophils Absolute: 0.1 10*3/uL (ref 0.0–0.7)
Eosinophils Relative: 1 % (ref 0–5)
Lymphocytes Relative: 23 % (ref 12–46)
Lymphs Abs: 1.1 10*3/uL (ref 0.7–4.0)
Monocytes Absolute: 0.4 10*3/uL (ref 0.1–1.0)
Monocytes Relative: 9 % (ref 3–12)
Neutro Abs: 3.3 10*3/uL (ref 1.7–7.7)
Neutrophils Relative %: 67 % (ref 43–77)

## 2011-05-28 LAB — CBC
HCT: 31.2 % — ABNORMAL LOW (ref 39.0–52.0)
Hemoglobin: 10 g/dL — ABNORMAL LOW (ref 13.0–17.0)
MCH: 26.5 pg (ref 26.0–34.0)
MCHC: 32.1 g/dL (ref 30.0–36.0)
MCV: 82.5 fL (ref 78.0–100.0)
Platelets: 164 10*3/uL (ref 150–400)
RBC: 3.78 MIL/uL — ABNORMAL LOW (ref 4.22–5.81)
RDW: 16.7 % — ABNORMAL HIGH (ref 11.5–15.5)
WBC: 4.9 10*3/uL (ref 4.0–10.5)

## 2011-05-28 LAB — BASIC METABOLIC PANEL
BUN: 17 mg/dL (ref 6–23)
CO2: 22 mEq/L (ref 19–32)
Calcium: 8.4 mg/dL (ref 8.4–10.5)
Chloride: 99 mEq/L (ref 96–112)
Creatinine, Ser: 1.35 mg/dL (ref 0.50–1.35)
GFR calc Af Amer: 70 mL/min — ABNORMAL LOW (ref >90)
GFR calc non Af Amer: 61 mL/min — ABNORMAL LOW (ref >90)
Glucose, Bld: 102 mg/dL — ABNORMAL HIGH (ref 70–99)
Potassium: 3.8 mEq/L (ref 3.5–5.1)
Sodium: 132 mEq/L — ABNORMAL LOW (ref 135–145)

## 2011-05-28 LAB — BUN: BUN: 18 mg/dL (ref 6–23)

## 2011-05-28 MED ORDER — MOXIFLOXACIN HCL 400 MG PO TABS: 400.0000 mg | ORAL_TABLET | Freq: Every day | ORAL | Status: AC

## 2011-05-28 MED ORDER — MOXIFLOXACIN HCL 400 MG PO TABS
400.0000 mg | ORAL_TABLET | Freq: Once | ORAL | Status: AC
  Administered 2011-05-28: 400 mg via ORAL
  Filled 2011-05-28: qty 1

## 2011-05-28 MED ORDER — BENZONATATE 100 MG PO CAPS
100.0000 mg | ORAL_CAPSULE | Freq: Three times a day (TID) | ORAL | Status: DC
Start: 2011-05-28 — End: 2017-12-04

## 2011-05-28 NOTE — ED Notes (Signed)
rx x 2, pt voiced understanding to f/u with PCP on Monday.

## 2011-05-28 NOTE — ED Provider Notes (Signed)
Medical screening examination/treatment/procedure(s) were performed by non-physician practitioner and as supervising physician I was immediately available for consultation/collaboration.  Yetunde Leis M Criss Bartles, MD 05/28/11 0727 

## 2011-05-28 NOTE — ED Notes (Signed)
Pt c/o fever/chills/body aches since Thursday.  Sx associated with nausea and some SOB.  Last took [employer] ASA earlier this evening.

## 2011-05-29 ENCOUNTER — Encounter: Payer: Self-pay | Admitting: Ophthalmology

## 2011-05-29 ENCOUNTER — Ambulatory Visit (INDEPENDENT_AMBULATORY_CARE_PROVIDER_SITE_OTHER): Payer: Medicaid Other | Admitting: Ophthalmology

## 2011-05-29 DIAGNOSIS — I10 Essential (primary) hypertension: Secondary | ICD-10-CM

## 2011-05-29 DIAGNOSIS — M533 Sacrococcygeal disorders, not elsewhere classified: Secondary | ICD-10-CM

## 2011-05-29 DIAGNOSIS — J069 Acute upper respiratory infection, unspecified: Secondary | ICD-10-CM
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
## 2011-05-29 NOTE — Telephone Encounter (Signed)
This patient's relative ALSO called me at 3:30 am about Casey Brennan and asked me to diagnose and treat him over the phone. I told her I could NOT and that we did not have clinic over the holidays. I offered that she should take him to the ER. Looks like another case for risk management maybe?

## 2011-05-29 NOTE — Telephone Encounter (Signed)
Thanks Ankit. I didn't feel comfortable making a diagnosis over the phone at 330 in the am without exam labs etc

## 2011-05-29 NOTE — Assessment & Plan Note (Addendum)
Patient likely had URI or bronchitis. He is improving on day 2 of Levaquin. Instructed patient to call if he does not have improvement or has worsening of his symptoms. Chest X-ray was clear of infiltrates in ED.

## 2011-05-29 NOTE — Telephone Encounter (Signed)
Patient was brought into ER and was evaluated by the ED physician that night. Patient was discharged home after evaluation.

## 2011-05-29 NOTE — Assessment & Plan Note (Signed)
Patients pain is likely musculoskeletal in origin and it is improving. Will reasses at his next visit. Advised to take tylenol for pain as he has some history of renal insufficiency.

## 2011-05-29 NOTE — Patient Instructions (Signed)
Take tylenol as need for pain in right hip and leg. Please call us if you are not feeling better in terms of cough, nausea etc.

## 2011-06-03 LAB — CULTURE, BLOOD (ROUTINE X 2)
Culture  Setup Time: 201211251107
Culture  Setup Time: 201211251108
Culture: NO GROWTH
Culture: NO GROWTH

## 2011-06-06 ENCOUNTER — Ambulatory Visit: Payer: Self-pay | Admitting: Infectious Diseases

## 2011-06-12 ENCOUNTER — Ambulatory Visit (INDEPENDENT_AMBULATORY_CARE_PROVIDER_SITE_OTHER): Payer: Medicaid Other | Admitting: Infectious Diseases

## 2011-06-12 ENCOUNTER — Encounter: Payer: Self-pay | Admitting: Internal Medicine

## 2011-06-12 ENCOUNTER — Encounter: Payer: Self-pay | Admitting: Infectious Diseases

## 2011-06-12 ENCOUNTER — Ambulatory Visit (INDEPENDENT_AMBULATORY_CARE_PROVIDER_SITE_OTHER): Payer: Medicaid Other | Admitting: Internal Medicine

## 2011-06-12 ENCOUNTER — Telehealth: Payer: Self-pay | Admitting: *Deleted

## 2011-06-12 ENCOUNTER — Ambulatory Visit: Payer: Medicaid Other | Admitting: Internal Medicine

## 2011-06-12 VITALS — BP 131/85 | HR 74 | Temp 97.1°F | Ht 69.0 in | Wt 213.5 lb

## 2011-06-12 DIAGNOSIS — K029 Dental caries, unspecified: Secondary | ICD-10-CM

## 2011-06-12 DIAGNOSIS — Z79899 Other long term (current) drug therapy: Secondary | ICD-10-CM

## 2011-06-12 DIAGNOSIS — B20 Human immunodeficiency virus [HIV] disease: Secondary | ICD-10-CM

## 2011-06-12 DIAGNOSIS — Z23 Encounter for immunization: Secondary | ICD-10-CM

## 2011-06-12 DIAGNOSIS — I10 Essential (primary) hypertension: Secondary | ICD-10-CM

## 2011-06-12 DIAGNOSIS — R51 Headache: Secondary | ICD-10-CM

## 2011-06-12 DIAGNOSIS — Z113 Encounter for screening for infections with a predominantly sexual mode of transmission: Secondary | ICD-10-CM

## 2011-06-12 DIAGNOSIS — N289 Disorder of kidney and ureter, unspecified: Secondary | ICD-10-CM

## 2011-06-12 DIAGNOSIS — B353 Tinea pedis: Secondary | ICD-10-CM

## 2011-06-12 DIAGNOSIS — B359 Dermatophytosis, unspecified: Secondary | ICD-10-CM

## 2011-06-12 MED ORDER — HYDROCODONE-ACETAMINOPHEN 5-500 MG PO TABS: 1.0000 | ORAL_TABLET | Freq: Four times a day (QID) | ORAL | Status: DC | PRN

## 2011-06-12 MED ORDER — MUPIROCIN 2 % EX OINT: 1.0000 "application " | TOPICAL_OINTMENT | Freq: Two times a day (BID) | CUTANEOUS | Status: DC

## 2011-06-12 NOTE — Progress Notes (Signed)
Addended by: Wendall Mola A on: 06/12/2011 09:56 AM   Modules accepted: Orders

## 2011-06-12 NOTE — Assessment & Plan Note (Signed)
Asked him to keep the area clean and dry. May apply over the counter talc. Frequent check for infections.

## 2011-06-12 NOTE — Telephone Encounter (Signed)
At office visit patient's sister said he needed a copy of a release to work note from 07/25/10 that was signed by Dr. Ninetta Lights. This is needed for insurance.  Dr. Ninetta Lights does not recall signing this note and after looking in EMR and Epic there is not a copy of this note scanned.  Dr. Ninetta Lights said he can not go back at this point and sign a return to work note for 07/2010. Wendall Mola CMA
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
## 2011-06-12 NOTE — Assessment & Plan Note (Signed)
He is being f/u by Dr Richardean Chimera. Await results from his EEGs. He asks for a release to be cleared back to work. Await his neuro work up prior to providing this.

## 2011-06-12 NOTE — Assessment & Plan Note (Signed)
He appears to be doing well. Will start Hep A vaccine. He has stopped his OI prophylaxis. Will repeat his #s in 3-4 months. It appears he he may have gotten the wrong HLA test. Will order correct.  I have asked them to resume taking his TRV daily as his Cr is normal today (CrCl ~70).

## 2011-06-12 NOTE — Assessment & Plan Note (Signed)
Obtain records from Dr. Leary Roca office and gave him 30 tabs of vicodin for help with headaches at night time. We discussed in detail the pros and cons of starting narcotics for something which is preventable. Since the neurologist is already working with the headaches, i will not prefer to treat it preemptively with narcotics as it will hinder with her treatment.

## 2011-06-12 NOTE — Assessment & Plan Note (Signed)
Well controlled 

## 2011-06-12 NOTE — Progress Notes (Signed)
  Subjective:    Patient ID: Casey Brennan, male    DOB: 11-Sep-1960, 51 y.o.   MRN: [id number]  HPI  Mr Mathey is 50 year old man with PMH as noted in the chart. I saw him in the ED a few days ago and apparently they liked the way I handled the situation and wants me to be his PCP.  He has multiple complaints today. The bronchitis that he had a few days ago has resolved after completing the levaquin therapy, He still has some wheezing but hasnt been using his inhalers more than usual.  He has interdigital itching and whitish patch on his right leg.  He has headaches for which he is seeing Dr Marylou Flesher. The patient wants me to prescribe vicodin every 6 hours as vicodin along with zanaflex relieves the pain. He had EEG done last Friday.  Due for Tdap.   Wants me to refer him to [facility] for [facility] problems.   Review of Systems  All other systems reviewed and are negative.       Objective:   Physical Exam  Constitutional: He is oriented to person, place, and time. He appears well-developed and well-nourished.       Drowsy at all times.  HENT:  Head: Normocephalic and atraumatic.  Eyes: Conjunctivae and EOM are normal. Pupils are equal, round, and reactive to light. No scleral icterus.  Neck: Normal range of motion. Neck supple. No JVD present. No thyromegaly present.  Cardiovascular: Normal rate, regular rhythm, normal heart sounds and intact distal pulses.  Exam reveals no gallop and no friction rub.   No murmur heard. Pulmonary/Chest: Effort normal and breath sounds normal. No respiratory distress. He has no wheezes. He has no rales.  Abdominal: Soft. Bowel sounds are normal. He exhibits no distension and no mass. There is no tenderness. There is no rebound and no guarding.  Musculoskeletal: Normal range of motion. He exhibits no edema and no tenderness.       There was a whitish discoloration of the skin between interdigital area of 4rth and 5th digits of right foot.    Lymphadenopathy:    He has no cervical adenopathy.  Neurological: He is alert and oriented to person, place, and time.  Psychiatric: He has a normal mood and affect. His behavior is normal.          Assessment & Plan:

## 2011-06-12 NOTE — Assessment & Plan Note (Signed)
Normotensive today.

## 2011-06-12 NOTE — Assessment & Plan Note (Signed)
Dentures done!

## 2011-06-12 NOTE — Patient Instructions (Signed)
Please use over the counter antibiotic talc to keep the feet dry and medicated. Please inspect the foot often to check for infections. Please bring all your medications at all office visits. Please follow up as needed.

## 2011-06-12 NOTE — Assessment & Plan Note (Signed)
His CrCl is improved today. I am not sure if this is related to his TFV or his NSAID. He will go back on daily TRV and will f/u his Cr.

## 2011-06-14 ENCOUNTER — Encounter: Payer: Self-pay | Admitting: Infectious Diseases

## 2011-06-14 ENCOUNTER — Telehealth: Payer: Self-pay | Admitting: *Deleted

## 2011-06-14 NOTE — Telephone Encounter (Signed)
Patient requested Dr. Ninetta Lights provide a release to work note from January.  Dr. Ninetta Lights spoke with patient's sister April and did create a letter.  Notified April it was ready for pick up, read the letter to her and she said it was unacceptable and requested that Dr. Ninetta Lights call her back.  Notified Dr. Ninetta Lights and he stands by the letter that was written.  Letter is up front to be picked up. Wendall Mola CMA

## 2011-07-08 ENCOUNTER — Other Ambulatory Visit: Payer: Self-pay | Admitting: Internal Medicine

## 2011-07-11 NOTE — Telephone Encounter (Signed)
Called to pharm 

## 2011-07-14 ENCOUNTER — Telehealth: Payer: Self-pay | Admitting: *Deleted

## 2011-07-14 NOTE — Telephone Encounter (Signed)
Call from pt's sister stated that pt has crackles in his chest.  Cough that she said was continuous .  Coughing up yellow phlegm.  Does not have a fever.  Stated that brother has been wheezing and has been using his inhaler with no improvement.[name]  Has been sick for 2 weeks.    Has a headache and decreased appetite.  No available appointments in the Clinics at this time.  Spoke with Dr. Coralee Pesa.  Pt's sister was advised to take pt to the ER today for assessment.  Angelina Ok, RN 07/14/2011 11:01 AM

## 2011-07-14 NOTE — Telephone Encounter (Signed)
As discussed

## 2011-07-15 ENCOUNTER — Emergency Department (HOSPITAL_COMMUNITY)
Admission: EM | Admit: 2011-07-15 | Discharge: 2011-07-16 | Disposition: A | Discharge status: Home / Self Care | Payer: Medicaid Other | Attending: Emergency Medicine | Admitting: Emergency Medicine

## 2011-07-15 ENCOUNTER — Other Ambulatory Visit: Payer: Self-pay

## 2011-07-15 ENCOUNTER — Encounter (HOSPITAL_COMMUNITY): Payer: Self-pay | Admitting: *Deleted

## 2011-07-15 DIAGNOSIS — Z21 Asymptomatic human immunodeficiency virus [HIV] infection status: Secondary | ICD-10-CM | POA: Insufficient documentation

## 2011-07-15 DIAGNOSIS — R10816 Epigastric abdominal tenderness: Secondary | ICD-10-CM | POA: Insufficient documentation

## 2011-07-15 DIAGNOSIS — R1013 Epigastric pain: Secondary | ICD-10-CM | POA: Insufficient documentation

## 2011-07-15 DIAGNOSIS — I10 Essential (primary) hypertension: Secondary | ICD-10-CM | POA: Insufficient documentation

## 2011-07-15 DIAGNOSIS — R141 Gas pain: Secondary | ICD-10-CM | POA: Insufficient documentation

## 2011-07-15 DIAGNOSIS — R197 Diarrhea, unspecified: Secondary | ICD-10-CM | POA: Insufficient documentation

## 2011-07-15 DIAGNOSIS — K8020 Calculus of gallbladder without cholecystitis without obstruction: Secondary | ICD-10-CM | POA: Insufficient documentation

## 2011-07-15 DIAGNOSIS — R0602 Shortness of breath: Secondary | ICD-10-CM | POA: Insufficient documentation

## 2011-07-15 DIAGNOSIS — R350 Frequency of micturition: Secondary | ICD-10-CM | POA: Insufficient documentation

## 2011-07-15 DIAGNOSIS — E119 Type 2 diabetes mellitus without complications: Secondary | ICD-10-CM | POA: Insufficient documentation

## 2011-07-15 DIAGNOSIS — R142 Eructation: Secondary | ICD-10-CM | POA: Insufficient documentation

## 2011-07-15 DIAGNOSIS — J45909 Unspecified asthma, uncomplicated: Secondary | ICD-10-CM | POA: Insufficient documentation

## 2011-07-15 DIAGNOSIS — Z79899 Other long term (current) drug therapy: Secondary | ICD-10-CM | POA: Insufficient documentation

## 2011-07-15 LAB — URINALYSIS, ROUTINE W REFLEX MICROSCOPIC
Bilirubin Urine: NEGATIVE
Glucose, UA: NEGATIVE mg/dL
Hgb urine dipstick: NEGATIVE
Ketones, ur: NEGATIVE mg/dL
Leukocytes, UA: NEGATIVE
Nitrite: NEGATIVE
Protein, ur: NEGATIVE mg/dL
Specific Gravity, Urine: 1.012 (ref 1.005–1.030)
Urobilinogen, UA: 0.2 mg/dL (ref 0.0–1.0)
pH: 7.5 (ref 5.0–8.0)

## 2011-07-15 LAB — CBC
HCT: 35.6 % — ABNORMAL LOW (ref 39.0–52.0)
Hemoglobin: 11.9 g/dL — ABNORMAL LOW (ref 13.0–17.0)
MCH: 27 pg (ref 26.0–34.0)
MCHC: 33.4 g/dL (ref 30.0–36.0)
MCV: 80.7 fL (ref 78.0–100.0)
Platelets: 173 10*3/uL (ref 150–400)
RBC: 4.41 MIL/uL (ref 4.22–5.81)
RDW: 16.5 % — ABNORMAL HIGH (ref 11.5–15.5)
WBC: 4.2 10*3/uL (ref 4.0–10.5)

## 2011-07-15 LAB — LIPASE, BLOOD: Lipase: 58 U/L (ref 11–59)

## 2011-07-15 LAB — COMPREHENSIVE METABOLIC PANEL
ALT: 14 U/L (ref 0–53)
AST: 16 U/L (ref 0–37)
Albumin: 3.8 g/dL (ref 3.5–5.2)
Alkaline Phosphatase: 89 U/L (ref 39–117)
BUN: 5 mg/dL — ABNORMAL LOW (ref 6–23)
CO2: 27 mEq/L (ref 19–32)
Calcium: 8.9 mg/dL (ref 8.4–10.5)
Chloride: 104 mEq/L (ref 96–112)
Creatinine, Ser: 1.05 mg/dL (ref 0.50–1.35)
GFR calc Af Amer: >90 mL/min (ref >90)
GFR calc non Af Amer: 82 mL/min — ABNORMAL LOW (ref >90)
Glucose, Bld: 105 mg/dL — ABNORMAL HIGH (ref 70–99)
Potassium: 4.2 mEq/L (ref 3.5–5.1)
Sodium: 137 mEq/L (ref 135–145)
Total Bilirubin: 0.2 mg/dL — ABNORMAL LOW (ref 0.3–1.2)
Total Protein: 7.5 g/dL (ref 6.0–8.3)

## 2011-07-15 MED ORDER — GI COCKTAIL ~~LOC~~
30.0000 mL | Freq: Once | ORAL | Status: AC
  Administered 2011-07-15: 30 mL via ORAL
  Filled 2011-07-15: qty 30

## 2011-07-15 MED ORDER — ONDANSETRON 4 MG PO TBDP
8.0000 mg | ORAL_TABLET | Freq: Once | ORAL | Status: AC
  Administered 2011-07-15: 8 mg via ORAL
  Filled 2011-07-15: qty 2

## 2011-07-15 MED ORDER — FAMOTIDINE 20 MG PO TABS
20.0000 mg | ORAL_TABLET | Freq: Once | ORAL | Status: AC
  Administered 2011-07-15: 20 mg via ORAL
  Filled 2011-07-15: qty 1

## 2011-07-15 MED ORDER — DICYCLOMINE HCL 10 MG/ML IM SOLN
20.0000 mg | Freq: Once | INTRAMUSCULAR | Status: AC
  Administered 2011-07-15: 20 mg via INTRAMUSCULAR
  Filled 2011-07-15: qty 2

## 2011-07-15 NOTE — ED Notes (Signed)
Received patient to tx rm. Pt noted alert and oriented and in no acute distress. Rates abd pain 4/10. Does report improvement post gi cocktail. No n/v/d. Abdomen noted distended with normoactive bowel sounds. Family members at bedside.

## 2011-07-15 NOTE — ED Notes (Signed)
abd pain with diarrhea no n v

## 2011-07-15 NOTE — ED Provider Notes (Signed)
History     CSN: [id number]  Arrival date & time 07/15/11  4098   First MD Initiated Contact with Patient 07/15/11 2136      Chief Complaint  Patient presents with  . Abdominal Pain    (Consider location/radiation/quality/duration/timing/severity/associated sxs/prior treatment) HPI History provided by pt.   Pt presents w/ c/o intermittent epigastric aching x 1 week.  Usually occurs in the afternoon or evening and seems to be associated w/ eating (not w/ exertion or position).  May radiate into the chest a little. No associated N/V but had diarrhea earlier in the week as well as increased urinary frequency.  Also associated w/ abdominal distension and mild SOB. Most recent BM this am and nml. Pt is HIV pos.  Per prior chart, most recent CD4 count 270 on 04/17/11.  Compliant w/ meds.  Also has h/o diabetes, HTN and asthma.  No h/o abd surgeries.  No h/o PUD.  Does not drink alcohol and does not take NSAIDs on a regular basis.   Past Medical History  Diagnosis Date  . HIV (human immunodeficiency virus infection)   . Pneumonia 07/12/10    SEPTIC SHOCK  . Severe frontal headaches 08/16/10  . Chest pain 07/12/10  . Cyst     left nipple  . Asthma   . Hypertension   . HIV (human immunodeficiency virus infection)     Past Surgical History  Procedure Date  . Upper leg soft tissue biopsy     left thigh  . Nipple biopsy     left  . Knee arthroscopy     right  . Acne cyst removal     Family History  Problem Relation Age of Onset  . Stroke Mother   . Cancer Mother   . Stroke Father   . Diabetes Father   . Hypertension Sister     History  Substance Use Topics  . Smoking status: Former Smoker    Quit date: 05/22/2010  . Smokeless tobacco: Never Used  . Alcohol Use: No     rare      Review of Systems  All other systems reviewed and are negative.    Allergies  Penicillins and Sulfonamide derivatives  Home Medications   Current Outpatient Rx  Name Route Sig  Dispense Refill  . ALBUTEROL SULFATE HFA 108 (90 BASE) MCG/ACT IN AERS Inhalation Inhale 1-2 puffs into the lungs every 4 (four) hours as needed. For shortness of breath    . ASPIRIN 325 MG PO TABS Oral Take 325 mg by mouth daily.      . BUDESONIDE-FORMOTEROL FUMARATE 80-4.5 MCG/ACT IN AERO Inhalation Inhale 2 puffs into the lungs 2 (two) times daily. 1 Inhaler 11  . DARUNAVIR ETHANOLATE 400 MG PO TABS Oral Take 2 tablets (800 mg total) by mouth daily. With Norvir and Truvada 60 tablet 3  . EMTRICITABINE-TENOFOVIR 200-300 MG PO TABS Oral Take 1 tablet by mouth daily. 30 tablet 3  . FOLIC ACID 1 MG PO TABS Oral Take 1 tablet (1 mg total) by mouth daily. 30 tablet 11  . GABAPENTIN 600 MG PO TABS Oral Take 600 mg by mouth 2 (two) times daily.    [name] HYDROCODONE-ACETAMINOPHEN 7.5-325 MG PO TABS Oral Take 1 tablet by mouth every 6 (six) hours as needed. For pain    . HYDROCODONE-ACETAMINOPHEN 5-500 MG PO TABS  TAKE 1 TABLET BY MOUTH EVERY NIGHT AT BEDTIME AS NEEDED FOR PAIN 30 tablet 0  . HYDROXYZINE PAMOATE 25 MG PO CAPS  Oral Take 25 mg by mouth 3 (three) times daily as needed. For itching/One capsule by mouth every 6 hours when necessary    . MUPIROCIN 2 % EX OINT Topical Apply 1 application topically 2 (two) times daily. 30 gram tube, apply to affected areas for 2 weeks. Place cotton balls between toes to allow better aeration. 66 g 3  . NAPROXEN SODIUM 220 MG PO TABS Oral Take 220 mg by mouth 2 (two) times daily with a meal.      . RALTEGRAVIR POTASSIUM 400 MG PO TABS Oral Take 1 tablet (400 mg total) by mouth 2 (two) times daily. 60 tablet 3  . RITONAVIR 100 MG PO CAPS Oral Take 1 capsule (100 mg total) by mouth daily. With Prezista and Truvada 30 capsule 3  . TIZANIDINE HCL 4 MG PO CAPS  Take 1.5 4mg  tablets twice a day (total of 12mg  a day) 90 capsule 1  . ZOLPIDEM TARTRATE ER 12.5 MG PO TBCR Oral Take 12.5 mg by mouth at bedtime as needed. For sleep      BP 140/79  Temp(Src) 98.5 F (36.9 C)  (Oral)  Resp 20  Ht 5\' 9"  (1.753 m)  Wt 195 lb (88.451 kg)  BMI 28.80 kg/m2  SpO2 96%  Physical Exam  Nursing note and vitals reviewed. Constitutional: He is oriented to person, place, and time. He appears well-developed and well-nourished. No distress.  HENT:  Head: Normocephalic and atraumatic.  Eyes:       Normal appearance  Neck: Normal range of motion.  Cardiovascular: Normal rate and regular rhythm.   Pulmonary/Chest: Effort normal and breath sounds normal.  Abdominal: Soft. Bowel sounds are normal. He exhibits no distension and no mass. There is no rebound and no guarding.       Mild epigastric ttp only  Neurological: He is alert and oriented to person, place, and time.  Skin: Skin is warm and dry. No rash noted.  Psychiatric: He has a normal mood and affect. His behavior is normal.    ED Course  Procedures (including critical care time)  Date: 07/16/2011  Rate: 61  Rhythm: normal sinus rhythm  QRS Axis: normal  Intervals: normal  ST/T Wave abnormalities: normal  Conduction Disutrbances:none  Narrative Interpretation:   Old EKG Reviewed: none available   Labs Reviewed  CBC - Abnormal; Notable for the following:    Hemoglobin 11.9 (*)    HCT 35.6 (*)    RDW 16.5 (*)    All other components within normal limits  COMPREHENSIVE METABOLIC PANEL - Abnormal; Notable for the following:    Glucose, Bld 105 (*)    BUN 5 (*)    Total Bilirubin 0.2 (*)    GFR calc non Af Amer 82 (*)    All other components within normal limits  LIPASE, BLOOD  URINALYSIS, ROUTINE W REFLEX MICROSCOPIC   US Abdomen Complete  07/16/2011  *RADIOLOGY REPORT*  Clinical Data:  Mid abdominal pain; history of gallstones.  Assess for cholecystitis.  ABDOMINAL ULTRASOUND COMPLETE  Comparison:  None  Findings:  Gallbladder:  The gallbladder is contracted; a single 5 mm nonmobile stone is noted within the gallbladder.  Apparent gallbladder wall thickening likely reflects its contracted state. No  ultrasonographic Murphy's sign is elicited; no pericholecystic fluid is appreciated.  Common Bile Duct:  0.4 cm in diameter; within normal limits in caliber.  Liver:  Mildly heterogeneous and slightly increased echogenicity, with coarsened echotexture, raising question for fatty infiltration; no focal lesions identified.  Limited Doppler evaluation demonstrates normal blood flow within the liver.  IVC:  Unremarkable in appearance.  Pancreas:  Although the pancreas is difficult to visualize in its entirety due to overlying bowel gas, no focal pancreatic abnormality is identified.  Spleen:  10.5 cm in length; within normal limits in size and echotexture.  Right kidney:  10.2 cm in length; normal in size, configuration and parenchymal echogenicity.  No evidence of hydronephrosis.  A 5 mm stone is noted along the lateral aspect of the right kidney; there is a 1.7 x 1.4 x 1.4 cm cyst at the upper pole of the right kidney.  Left kidney:  10.1 cm in length; normal in size, configuration and parenchymal echogenicity.  No evidence of hydronephrosis.  Multiple left renal cysts are noted, measuring up to 1.9 cm in size.  Abdominal Aorta:  Normal in caliber; no aneurysm identified.  There is ectasia of the proximal abdominal aorta to almost 3.0 cm in maximal diameter, without significant aneurysmal dilatation.  IMPRESSION:  1.  No evidence for cholecystitis; gallbladder contracted, with cholelithiasis.  No ultrasonographic Murphy's sign elicited. 2.  Suggestion of mild fatty infiltration within the liver. 3.  5 mm nonobstructing stone noted within the right kidney; scattered bilateral renal cysts seen. 4.  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  Original Report Authenticated By: Tonia Ghent, M.D.     1. Cholelithiasis       MDM  Pt presents w/ intermittent epigastric pain and abdominal distension x 1 week.  Seems to be related to  eating.  Non-exertional.   Abd mildly distended and epigastric ttp on exam.  Labs unremarkable. [place] abd positive for cholelithiasis w/out cholecystitis.  Results discussed w/ pt and his family.  He received a GI cocktail, pepcid and vicodin for pain.  Tolerating pos.  D/c'd home w/ referral to [facility].  Return precautions discussed.          Otilio Miu, [place] 07/16/11 [phone]

## 2011-07-16 ENCOUNTER — Emergency Department (HOSPITAL_COMMUNITY): Payer: Medicaid Other

## 2011-07-16 MED ORDER — HYDROCODONE-ACETAMINOPHEN 5-325 MG PO TABS
1.0000 | ORAL_TABLET | Freq: Once | ORAL | Status: AC
  Administered 2011-07-16: 1 via ORAL
  Filled 2011-07-16: qty 1

## 2011-07-16 MED ORDER — DICYCLOMINE HCL 20 MG PO TABS: 20.0000 mg | ORAL_TABLET | Freq: Two times a day (BID) | ORAL | Status: DC

## 2011-07-16 MED ORDER — FAMOTIDINE 10 MG PO TABS: ORAL_TABLET | ORAL | Status: DC

## 2011-07-16 NOTE — ED Provider Notes (Signed)
Medical screening examination/treatment/procedure(s) were performed by non-physician practitioner and as supervising physician I was immediately available for consultation/collaboration.  Loren Racer, MD 07/16/11 [phone]

## 2011-07-16 NOTE — ED Notes (Signed)
Pt is in ultrasound at this time

## 2011-07-18 ENCOUNTER — Telehealth: Payer: Self-pay | Admitting: *Deleted

## 2011-07-18 NOTE — Telephone Encounter (Signed)
Received faxed request from Casey Brennan [phone] for prior authorization on pt's Zolpidem er 12.5mg  tabs rxd by Dr Maisie Fus.  Called medicaid at [phone] to initiate over the phone, but was informed that form has to be completed by MD, unable to complete over the phone.  Form placed in Dr Maisie Fus box for completion, once completed it will be faxed to [facility] for review.Casey Spittle Cassady1/15/20139:58 AM

## 2011-07-31 ENCOUNTER — Encounter: Payer: Medicaid Other | Admitting: Internal Medicine

## 2011-08-01 NOTE — Progress Notes (Signed)
This encounter was created in error - please disregard.

## 2011-08-03 ENCOUNTER — Ambulatory Visit (INDEPENDENT_AMBULATORY_CARE_PROVIDER_SITE_OTHER): Payer: Medicaid Other | Admitting: General Surgery

## 2011-08-03 ENCOUNTER — Encounter (INDEPENDENT_AMBULATORY_CARE_PROVIDER_SITE_OTHER): Payer: Self-pay | Admitting: General Surgery

## 2011-08-03 ENCOUNTER — Encounter: Payer: Medicaid Other | Admitting: Internal Medicine

## 2011-08-03 VITALS — BP 138/88 | HR 72 | Temp 97.8°F | Resp 18 | Ht 69.0 in | Wt 214.8 lb

## 2011-08-03 DIAGNOSIS — R1013 Epigastric pain: Secondary | ICD-10-CM

## 2011-08-03 NOTE — Progress Notes (Signed)
Subjective:     Patient ID: Casey Brennan, male   DOB: Jun 06, 1961, 51 y.o.   MRN: [id number]  HPI This is a 51 year old male who is well known to me from a left retro-areolar abscess. He has had since December some epigastric pain that is not associated with eating. He reports that this is occasionally associated with some nausea but no vomiting. He points to his epigastrium and also complains that he has abdominal bloating as well. He does not have any right upper quadrant pain and there is no association with eating at all either. He went to the emergency room a couple of weeks ago where he was evaluated and underwent an ultrasound which showed a contracted gallbladder with a single 5 mm stone. There was really no wall thickening or Murphy sign associated with this at all. His liver function tests as well as his lipase were normal at that time. He was then referred to discuss possible cholecystectomy for what was thought to be gallbladder disease. He has not started a new medication since last September.   Review of Systems ABDOMINAL ULTRASOUND COMPLETE  Comparison: None  Findings:  Gallbladder: The gallbladder is contracted; a single 5 mm  nonmobile stone is noted within the gallbladder. Apparent  gallbladder wall thickening likely reflects its contracted state.  No ultrasonographic Murphy's sign is elicited; no pericholecystic  fluid is appreciated.  Common Bile Duct: 0.4 cm in diameter; within normal limits in  caliber.  Liver: Mildly heterogeneous and slightly increased echogenicity,  with coarsened echotexture, raising question for fatty  infiltration; no focal lesions identified. Limited Doppler  evaluation demonstrates normal blood flow within the liver.  IVC: Unremarkable in appearance.  Pancreas: Although the pancreas is difficult to visualize in its  entirety due to overlying bowel gas, no focal pancreatic  abnormality is identified.  Spleen: 10.5 cm in length; within normal  limits in size and  echotexture.  Right kidney: 10.2 cm in length; normal in size, configuration and  parenchymal echogenicity. No evidence of hydronephrosis. A 5 mm  stone is noted along the lateral aspect of the right kidney; there  is a 1.7 x 1.4 x 1.4 cm cyst at the upper pole of the right kidney.  Left kidney: 10.1 cm in length; normal in size, configuration and  parenchymal echogenicity. No evidence of hydronephrosis. Multiple  left renal cysts are noted, measuring up to 1.9 cm in size.  Abdominal Aorta: Normal in caliber; no aneurysm identified. There  is ectasia of the proximal abdominal aorta to almost 3.0 cm in  maximal diameter, without significant aneurysmal dilatation.  IMPRESSION:  1. No evidence for cholecystitis; gallbladder contracted, with  cholelithiasis. No ultrasonographic Murphy's sign elicited.  2. Suggestion of mild fatty infiltration within the liver.  3. 5 mm nonobstructing stone noted within the right kidney;  scattered bilateral renal cysts seen.  4. 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     Objective:   Physical Exam  Abdominal: Soft. Bowel sounds are normal. There is tenderness in the epigastric area. There is no rigidity, no guarding and negative Murphy's sign. No hernia.       Assessment:     Epigastric pain, bloating    Plan:     I'm not entirely sure that his symptoms are referable to his gallbladder at this point. His ultrasound does not show any real significant abnormalities and clinically this may very well not be  his gallbladder also. He is on a number of medications and has a number of other etiologies for his possible symptoms. I think he would be best to have him see gastroenterology first for consideration of endoscopy. It had evaluation and to be negative I will likely have sent him for a HIDA scan and see him back.

## 2011-08-03 NOTE — Patient Instructions (Signed)
Return after seeing gastroenterology

## 2011-08-09 ENCOUNTER — Telehealth (INDEPENDENT_AMBULATORY_CARE_PROVIDER_SITE_OTHER): Payer: Self-pay

## 2011-08-09 NOTE — Telephone Encounter (Signed)
Per Lawanna Kobus pt came to [facility] today for appt but walked out due to not having co-pay. Pt would not reschedule appt.

## 2011-08-09 NOTE — Telephone Encounter (Signed)
He really needs to see GI.  I don't think he needs gb out right now.

## 2011-08-10 NOTE — Telephone Encounter (Signed)
Spoke to pt about really needing to keep his appt with [facility] and the pt is going back today for another appt with them.

## 2011-08-14 NOTE — Assessment & Plan Note (Signed)
Guaifenesin/Dextromethorphan15 mL every 4 hours when necessary  Ibuprofen. 600 mg every 6-8 hours when necessary , fever, and discomfort  Diphenhydramine.25-50 mg by mouth each bedtime when necessary  Cetirizine 10 mg by mouth every morning Bed rest for at least the next 2 days. Increase fluid intake. Warm packs over sinuses if needed. Proper nutrition with a least 3 meals a day. Contact clinic or go to urgent care. If symptoms worsen over the next 5 days or do not improve in the next 7 days. Stop smoking

## 2011-08-14 NOTE — Progress Notes (Signed)
Subjective:    Patient ID: Casey Brennan is a 51 y.o. male.  Chief Complaint: HIV Follow-up Visit Casey Brennan is here for follow-up of HIV infection.  He is feeling unchanged since his last visit.  He claims continued adherence to therapy with good tolerance and no complications. There are additional complaints.  Subjective: Patient complains of 7 days of congestion, nasal blockage, post nasal drip, both sides, upper sinus pain and itching in eyes. Also, complaining of continued "itchy bumps." These are mostly located on the arms, abdomen, and lower extremities. This is been something he has had in the past.    Data Review: Diagnostic studies reviewed.  Review of Systems - General ROS: positive for  - fatigue and malaise negative for - chills, fever or night sweats Psychological ROS: positive for - anxiety, irritability and mood swings negative for - behavioral disorder, concentration difficulties, depression or memory difficulties Ophthalmic ROS: negative ENT ROS: positive for - headaches, nasal congestion, nasal discharge and sinus pain negative for - tinnitus, vertigo or visual changes Respiratory ROS: no cough, shortness of breath, or wheezing Cardiovascular ROS: no chest pain or dyspnea on exertion Gastrointestinal ROS: no abdominal pain, change in bowel habits, or black or bloody stools Neurological ROS: no TIA or stroke symptoms Dermatological ROS: positive for dry skin, pruritus, rash and skin lesion changes negative for acne, hair changes and nail changes  Objective:   General appearance: alert, cooperative, appears older than stated age, no distress and Irritable disposition Head: Normocephalic, without obvious abnormality, atraumatic Eyes: conjunctivae/corneas clear. PERRL, EOM's intact. Fundi benign. Ears: normal TM's and external ear canals both ears Nose: Nares normal. Septum midline. Mucosa normal. No drainage or sinus tenderness., moderate congestion Throat:  abnormal findings: Postnasal drainage noted Resp: clear to auscultation bilaterally Cardio: regular rate and rhythm, S1, S2 normal, no murmur, click, rub or gallop GI: soft, non-tender; bowel sounds normal; no masses,  no organomegaly Extremities: edema Bilateral lower extremities from mid calf Skin: excoriation - arm(s) bilateral, abdomen, lower leg(s) bilateral, macular - arm(s) bilateral, abdomen, lower leg(s) bilateral, papular - arm(s) bilateral, abdomen, lower leg(s) bilateral and Radiating follicular in nature, with erythematous bases Neurologic: Grossly normal Psych:  No vegetative signs or delusional behaviors noted.    Laboratory: From January 2012 ,  CD4 count was <10 c/cmm @ <1 %. Viral load 1,480,000 copies/ml.     Assessment/Plan:   Allergic sinusitis Guaifenesin/Dextromethorphan15 mL every 4 hours when necessary  Ibuprofen. 600 mg every 6-8 hours when necessary , fever, and discomfort  Diphenhydramine.25-50 mg by mouth each bedtime when necessary  Cetirizine 10 mg by mouth every morning Bed rest for at least the next 2 days. Increase fluid intake. Warm packs over sinuses if needed. Proper nutrition with a least 3 meals a day. Contact clinic or go to urgent care. If symptoms worsen over the next 5 days or do not improve in the next 7 days. Stop smoking   HIV INFECTION Clinically stable and tolerating, regimen well. We'll continue with present management and repeat staging labs today. 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 Under normal circumstances would refer him now to a dermatologist. However, he is not have any hair sores, to see the dermatologist at this time. Therefore, continuing current regimen. That includes mupirocin and doxycycline will have to be continued at this time. We will prescribe hydroxyzine for pruritus, and instruct him today with  antibacterial  soap, especially over those areas that have open wounds. He verbally acknowledged this information and agreed with plan of care.     Nansi Birmingham A. Sundra Aland, MS, [facility] [phone]  08/14/2011, 8:29 AM

## 2011-08-14 NOTE — Assessment & Plan Note (Addendum)
Clinically stable and tolerating, regimen well. We'll continue with present management and repeat staging labs today. Followup 2 weeks

## 2011-08-14 NOTE — Assessment & Plan Note (Signed)
Relatively extensive follicular pattern noted, especially to the abdomen, and the lower extremities. Under normal circumstances would refer him now to a dermatologist. However, he is not have any hair sores, to see the dermatologist at this time. Therefore, continuing current regimen. That includes mupirocin and doxycycline will have to be continued at this time. We will prescribe hydroxyzine for pruritus, and instruct him today with antibacterial soap, especially over those areas that have open wounds. He verbally acknowledged this information and agreed with plan of care.

## 2011-08-16 ENCOUNTER — Other Ambulatory Visit: Payer: Self-pay | Admitting: Gastroenterology

## 2011-08-16 NOTE — Progress Notes (Signed)
Subjective:  Still, and chronic edema to both lower extremities. Also, reports a fall at home, where he hit his head, no loss of consciousness, slight headache.  Review of Systems - General ROS: positive for  - fatigue and malaise Psychological ROS: negative Ophthalmic ROS: negative ENT ROS: negative Respiratory ROS: no cough, shortness of breath, or wheezing Cardiovascular ROS: no chest pain or dyspnea on exertion Gastrointestinal ROS: no abdominal pain, change in bowel habits, or black or bloody stools Musculoskeletal ROS: negative Neurological ROS: no TIA or stroke symptoms positive for - headaches Dermatological ROS: positive for rash   Objective:    General appearance: alert, cooperative and no distress Head: Normocephalic, without obvious abnormality, atraumatic Eyes: conjunctivae/corneas clear. PERRL, EOM's intact. Fundi benign. Ears: normal TM's and external ear canals both ears Throat: lips, mucosa, and tongue normal; teeth and gums normal Neck: no adenopathy, no carotid bruit, no JVD, supple, symmetrical, trachea midline and thyroid not enlarged, symmetric, no tenderness/mass/nodules Resp: clear to auscultation bilaterally Cardio: regular rate and rhythm, S1, S2 normal, no murmur, click, rub or gallop GI: soft, non-tender; bowel sounds normal; no masses,  no organomegaly Extremities: edema Bilateral 2-3+ pitting edema to lower legs. Pulses: 2+ and symmetric Skin: Follicular lesions to extremity still persists, as well as 2 chest and abdomen. No new lesions noted or lesions seem to be healing with good granulation. Neurologic: Alert and oriented X 3, normal strength and tone. Normal symmetric reflexes. Normal coordination and gait Psych:  No vegetative signs or delusional behaviors noted.     Assessment/Plan:  Edema of both legs This may be due to his advanced HIV state and his sedentary lifestyle. Recommend elevating her legs, using support stockings (TED hose),  avoid sodium rich products. We will continue to monitor this.  Fall at home It is head when he fell down at home with a small contusion. No loss of consciousness. We'll continue to monitor.      Jori Thrall A. Sundra Aland, MS, [facility] [phone]  08/16/2011,  12:15 AM

## 2011-08-16 NOTE — Assessment & Plan Note (Signed)
This may be due to his advanced HIV state and his sedentary lifestyle. Recommend elevating her legs, using support stockings (TED hose), avoid sodium rich products. We will continue to monitor this.

## 2011-08-16 NOTE — Assessment & Plan Note (Signed)
It is head when he fell down at home with a small contusion. No loss of consciousness. We'll continue to monitor.

## 2011-08-24 NOTE — Telephone Encounter (Signed)
Received faxed notice that PA form that was faxed is incomplete (unable to locate MD's NPI/DEA #).  Request resent with attending information.Casey Spittle Cassady2/21/20139:54 AM

## 2011-08-31 ENCOUNTER — Telehealth: Payer: Self-pay | Admitting: Internal Medicine

## 2011-08-31 NOTE — Telephone Encounter (Signed)
Teaching Service Intern on Call:  Paged by patient's sister at 5:30am.  Patient having shortness of breath for the past four days, worse this morning so that he cannot sleep.  Worse when laying down.  Patient has reactive airway disease.  His albuterol inhaler helps some, but he is still having SOB at rest.  Has had "sinus congestion" over this time as well.    I recommended that patient should go to the emergency department for evaluation given SOB at rest.  Patient and his sister did not want to do this and asked if they could be seen in the clinic today or tomorrow instead.  I reiterated recommendation that he be seen for evaluation as soon as possible in the ED, but they explained that they would not be taking this recommendation.    Will message clinic front desk staff to make patient appt to be seen in clinic first available. Instructed patient and sister that he should go to ED if his condition worsens.

## 2011-09-01 ENCOUNTER — Encounter: Payer: Self-pay | Admitting: Internal Medicine

## 2011-09-01 ENCOUNTER — Ambulatory Visit (HOSPITAL_COMMUNITY)
Admission: RE | Admit: 2011-09-01 | Discharge: 2011-09-01 | Disposition: A | Discharge status: Home / Self Care | Payer: Medicaid Other | Source: Ambulatory Visit | Attending: Internal Medicine | Admitting: Internal Medicine

## 2011-09-01 ENCOUNTER — Other Ambulatory Visit: Payer: Self-pay | Admitting: Internal Medicine

## 2011-09-01 ENCOUNTER — Other Ambulatory Visit: Payer: Self-pay | Admitting: Infectious Diseases

## 2011-09-01 ENCOUNTER — Other Ambulatory Visit: Payer: Self-pay | Admitting: *Deleted

## 2011-09-01 ENCOUNTER — Ambulatory Visit (INDEPENDENT_AMBULATORY_CARE_PROVIDER_SITE_OTHER): Payer: Medicaid Other | Admitting: Internal Medicine

## 2011-09-01 VITALS — BP 136/91 | HR 113 | Temp 100.6°F | Ht 69.0 in | Wt 214.9 lb

## 2011-09-01 DIAGNOSIS — R05 Cough: Secondary | ICD-10-CM | POA: Insufficient documentation

## 2011-09-01 DIAGNOSIS — R059 Cough, unspecified: Secondary | ICD-10-CM

## 2011-09-01 DIAGNOSIS — R509 Fever, unspecified: Secondary | ICD-10-CM

## 2011-09-01 DIAGNOSIS — B20 Human immunodeficiency virus [HIV] disease: Secondary | ICD-10-CM

## 2011-09-01 DIAGNOSIS — J069 Acute upper respiratory infection, unspecified: Secondary | ICD-10-CM

## 2011-09-01 DIAGNOSIS — J45909 Unspecified asthma, uncomplicated: Secondary | ICD-10-CM

## 2011-09-01 LAB — COMPREHENSIVE METABOLIC PANEL
ALT: 11 U/L (ref 0–53)
AST: 14 U/L (ref 0–37)
Albumin: 3.9 g/dL (ref 3.5–5.2)
Alkaline Phosphatase: 93 U/L (ref 39–117)
BUN: 12 mg/dL (ref 6–23)
CO2: 27 mEq/L (ref 19–32)
Calcium: 9.6 mg/dL (ref 8.4–10.5)
Chloride: 101 mEq/L (ref 96–112)
Creat: 1.43 mg/dL — ABNORMAL HIGH (ref 0.50–1.35)
Glucose, Bld: 95 mg/dL (ref 70–99)
Potassium: 3.8 mEq/L (ref 3.5–5.3)
Sodium: 137 mEq/L (ref 135–145)
Total Bilirubin: 0.7 mg/dL (ref 0.3–1.2)
Total Protein: 7.9 g/dL (ref 6.0–8.3)

## 2011-09-01 LAB — CBC WITH DIFFERENTIAL/PLATELET
Basophils Absolute: 0 10*3/uL (ref 0.0–0.1)
Basophils Relative: 0 % (ref 0–1)
Eosinophils Absolute: 0.3 10*3/uL (ref 0.0–0.7)
Eosinophils Relative: 4 % (ref 0–5)
HCT: 36.4 % — ABNORMAL LOW (ref 39.0–52.0)
Hemoglobin: 12.5 g/dL — ABNORMAL LOW (ref 13.0–17.0)
Lymphocytes Relative: 30 % (ref 12–46)
Lymphs Abs: 2 10*3/uL (ref 0.7–4.0)
MCH: 28.7 pg (ref 26.0–34.0)
MCHC: 34.3 g/dL (ref 30.0–36.0)
MCV: 83.5 fL (ref 78.0–100.0)
Monocytes Absolute: 0.7 10*3/uL (ref 0.1–1.0)
Monocytes Relative: 11 % (ref 3–12)
Neutro Abs: 3.6 10*3/uL (ref 1.7–7.7)
Neutrophils Relative %: 55 % (ref 43–77)
Platelets: 150 10*3/uL (ref 150–400)
RBC: 4.36 MIL/uL (ref 4.22–5.81)
RDW: 15.4 % (ref 11.5–15.5)
WBC: 6.6 10*3/uL (ref 4.0–10.5)

## 2011-09-01 MED ORDER — RALTEGRAVIR POTASSIUM 400 MG PO TABS: 400.0000 mg | ORAL_TABLET | Freq: Two times a day (BID) | ORAL | Status: DC

## 2011-09-01 MED ORDER — HYDROCOD POLST-CHLORPHEN POLST 10-8 MG/5ML PO LQCR: 5.0000 mL | Freq: Two times a day (BID) | ORAL | Status: DC | PRN

## 2011-09-01 MED ORDER — RALTEGRAVIR POTASSIUM 400 MG PO TABS
400.0000 mg | ORAL_TABLET | Freq: Two times a day (BID) | ORAL | Status: DC
Start: 2011-09-01 — End: 2011-09-25

## 2011-09-01 MED ORDER — CYANOCOBALAMIN 1000 MCG/ML IJ SOLN
1000.0000 ug | Freq: Once | INTRAMUSCULAR | Status: AC
  Administered 2011-09-01: 1000 ug via INTRAMUSCULAR

## 2011-09-01 MED ORDER — HYDROCODONE-ACETAMINOPHEN 5-500 MG PO TABS: 1.0000 | ORAL_TABLET | Freq: Three times a day (TID) | ORAL | Status: DC | PRN

## 2011-09-01 MED ORDER — ALBUTEROL SULFATE (2.5 MG/3ML) 0.083% IN NEBU
2.5000 mg | INHALATION_SOLUTION | Freq: Four times a day (QID) | RESPIRATORY_TRACT | Status: DC | PRN
  Administered 2011-09-01: 2.5 mg via RESPIRATORY_TRACT

## 2011-09-01 MED ORDER — RITONAVIR 100 MG PO CAPS: 100.0000 mg | ORAL_CAPSULE | Freq: Every day | ORAL | Status: DC

## 2011-09-01 MED ORDER — LEVOFLOXACIN 500 MG PO TABS: 500.0000 mg | ORAL_TABLET | Freq: Every day | ORAL | Status: AC

## 2011-09-01 MED ORDER — ALBUTEROL SULFATE HFA 108 (90 BASE) MCG/ACT IN AERS: 1.0000 | INHALATION_SPRAY | RESPIRATORY_TRACT | Status: DC | PRN

## 2011-09-01 MED ORDER — DARUNAVIR ETHANOLATE 400 MG PO TABS: 800.0000 mg | ORAL_TABLET | Freq: Every day | ORAL | Status: DC

## 2011-09-01 MED ORDER — EMTRICITABINE-TENOFOVIR DF 200-300 MG PO TABS: 1.0000 | ORAL_TABLET | Freq: Every day | ORAL | Status: DC

## 2011-09-01 NOTE — Progress Notes (Signed)
Subjective:     Patient ID: Casey Brennan, male   DOB: 09/20/1960, 51 y.o.   MRN: [id number]  HPI  Pt reports fevers and chills that began on Wed evening with sinus congestion that started on Sunday.  Tmax at home was 102 at 10:00pm.  He admits to a cough present for the past three days that became productive on the day of the OV.  He admits to SOB with coughing no DOE.  No chest pain, syncope, nausea, vomiting, or diarrhea.  He denies sore throat.  He admits to signigicant sinus congestion with increased nasal drainage.  He admit to an oral ulceration that he first noticed this morning.  He denies any sick contacts, or recent travel.    He admits to a headache in the frontal area.   He has been on his current ART regimen since last Feb.  He has not missed any doses of his antiretrovirals.  Last CD4 270 with UD VL in 04/2011.    Review of Systems Review of Systems  Constitutional: Negative for fever, chills, diaphoresis, activity change, appetite change, fatigue and unexpected weight change.  HENT: Negative for hearing loss, congestion and neck stiffness.   Eyes: Negative for photophobia, pain and visual disturbance.  Respiratory: Negative for cough, chest tightness, shortness of breath and wheezing.   Cardiovascular: Negative for chest pain and palpitations.  Gastrointestinal: Negative for abdominal pain, blood in stool and anal bleeding.  Genitourinary: Negative for dysuria, hematuria and difficulty urinating.  Musculoskeletal: Negative for joint swelling.  Neurological: Negative for dizziness, syncope, speech difficulty, weakness, numbness and headaches.      Objective:   Physical Exam VItal signs reviewed and stable. GEN: Patient appears ill.  Alert and oriented x 3.  Pleasant, conversant, and cooperative to exam. HEENT: head is autraumatic and normocephalic.  Neck is supple without palpable masses or lymphadenopathy.  No JVD or carotid bruits.  Vision intact.  EOMI.  PERRLA.  Sclerae  anicteric.  Conjunctivae without pallor or injection. Mucous membranes are moist.  Oropharynx is without erythema, exudates, or other abnormal lesions. Bilateral ear canals are clear; TMs without erythema, air fluid level, or bulging bilaterally.  RESP:  Diminished breath sounds noted in and left mandible and left lower lung fields. Lungs are otherwise clear to ascultation bilaterally with good air movement.  No wheezes, ronchi, or rubs. CARDIOVASCULAR: Tachycardic, normal rhythm.  Clear S1, S2, no murmurs, gallops, or rubs. ABDOMEN: soft, non-tender, non-distended.  Bowels sounds present in all quadrants and normoactive.  No palpable masses. EXT: warm and dry.  Peripheral pulses equal, intact, and +2 globally.  No clubbing or cyanosis.  No edema in bilateral lower extremities. SKIN: warm and dry with normal turgor.  No rashes or abnormal lesions observed. NEURO: CN II-XII grossly intact.  Muscle strength +5/5 in bilateral upper and lower extremities.  Sensation is grossly intact.  No focal deficit.     Assessment:

## 2011-09-01 NOTE — Patient Instructions (Addendum)
Schedule an appointment for follow up next week. If you are feeling 100% better, you can cancel this appointment. Start taking levaquin TODAY and be sure to take the complete 7 day course. If you feel worse over the weekend, go to the ER. Keep taking all of your other medications as directed.

## 2011-09-04 ENCOUNTER — Other Ambulatory Visit: Payer: Self-pay | Admitting: *Deleted

## 2011-09-04 MED ORDER — ZOLPIDEM TARTRATE ER 12.5 MG PO TBCR: 12.5000 mg | EXTENDED_RELEASE_TABLET | Freq: Every evening | ORAL | Status: DC | PRN

## 2011-09-04 NOTE — Telephone Encounter (Signed)
Talked with Luisa Hart at [facility] - they already has a Rx for Ambien CR - needs prior authorization before med given to pt.

## 2011-09-05 LAB — HIV-1 RNA QUANT-NO REFLEX-BLD
HIV 1 RNA Quant: 49 copies/mL — ABNORMAL HIGH (ref <20)
HIV-1 RNA Quant, Log: 1.69 {Log} — ABNORMAL HIGH (ref <1.30)

## 2011-09-05 NOTE — Assessment & Plan Note (Signed)
Patient is presenting with fever, tachycardia, and increased cough productive of yellow sputum.  This is concerning for acute bronchitis versus pneumonia.  He currently meets 2/4 for criteria for SIRS/Sepsis.  Discussed the benefits of hospital admission, particularly close observation and ability to rapidly escalate treatment.   At this time, Casey Brennan prefers to try outpatient therapy.  I believe this is a reasonable course, as he is normotensive, fully alert and oriented, and hemodynamically stable.  Stressed to the importance of vigilance to both the patient and his sister and encouraged them to return to the emergency room this weekend if the patient experience any decline in his status, or new/concerning symptoms.  His last CD4 count was 270; technically he should not be at risk for opportunistic infections if he has truly been compliant with his antiretroviral therapy and has not suffered a decline in his CD4 count.  Will proceed with treatment plan outlined below - Obtain blood work including: CBC with differential, compressive metabolic panel, CD4 count, HIV viral load, and routine blood cultures - Obtain PA and lateral chest x-ray to evaluate for pneumonia or other underlying pulmonary process - Empiric Antimicrobial treatment with Levaquin - Will administer one breathing treatment with albuterol nebs now per patient and sister's request - Tussionex for relief of cough and improved sleep - Advised patient to take over-the-counter Tylenol as directed for treatment of fever - Patient and sister agreed to return to the emergency room over the weekend if patient declines or develops new/concerning symptoms.  Patient is also advised to follow up next week if his symptoms are not significantly improved after 2-3 days of outpatient therapy including levaquin - Will follow up all blood work and contact the patient this evening if there are any concerning abnormalities - Will contact the patient  immediately if blood culture results reveal bacteremia  Approximately at least 50% face-to-face time was spent counseling the patient about his acute medical issue and discussing plans for treatment and evaluation with the patient and his sister

## 2011-09-07 LAB — CULTURE, BLOOD (SINGLE)
Organism ID, Bacteria: NO GROWTH
Organism ID, Bacteria: NO GROWTH

## 2011-09-11 ENCOUNTER — Telehealth: Payer: Self-pay | Admitting: *Deleted

## 2011-09-11 NOTE — Telephone Encounter (Signed)
Call to [facility] for Prior authorization for Zolpidem.  Previously denied-needs to be resent on correct form.  Request was redone and placed in Dr. Sumner Boast box for completion. Angelina Ok, RN 09/11/2011 11:18 AM.

## 2011-09-12 ENCOUNTER — Encounter: Payer: Self-pay | Admitting: Internal Medicine

## 2011-09-15 ENCOUNTER — Other Ambulatory Visit: Payer: Self-pay | Admitting: *Deleted

## 2011-09-15 DIAGNOSIS — Z7721 Contact with and (suspected) exposure to potentially hazardous body fluids: Secondary | ICD-10-CM

## 2011-09-25 ENCOUNTER — Encounter (HOSPITAL_COMMUNITY): Payer: Self-pay | Admitting: Emergency Medicine

## 2011-09-25 ENCOUNTER — Emergency Department (HOSPITAL_COMMUNITY)
Admission: EM | Admit: 2011-09-25 | Discharge: 2011-09-26 | Disposition: A | Discharge status: Home / Self Care | Payer: Medicaid Other | Attending: Emergency Medicine | Admitting: Emergency Medicine

## 2011-09-25 ENCOUNTER — Emergency Department (HOSPITAL_COMMUNITY): Payer: Medicaid Other

## 2011-09-25 DIAGNOSIS — Z8701 Personal history of pneumonia (recurrent): Secondary | ICD-10-CM | POA: Insufficient documentation

## 2011-09-25 DIAGNOSIS — B20 Human immunodeficiency virus [HIV] disease: Secondary | ICD-10-CM

## 2011-09-25 DIAGNOSIS — R509 Fever, unspecified: Secondary | ICD-10-CM

## 2011-09-25 DIAGNOSIS — J45909 Unspecified asthma, uncomplicated: Secondary | ICD-10-CM | POA: Insufficient documentation

## 2011-09-25 DIAGNOSIS — J40 Bronchitis, not specified as acute or chronic: Secondary | ICD-10-CM | POA: Insufficient documentation

## 2011-09-25 DIAGNOSIS — I10 Essential (primary) hypertension: Secondary | ICD-10-CM | POA: Insufficient documentation

## 2011-09-25 DIAGNOSIS — Z21 Asymptomatic human immunodeficiency virus [HIV] infection status: Secondary | ICD-10-CM | POA: Insufficient documentation

## 2011-09-25 DIAGNOSIS — Z79899 Other long term (current) drug therapy: Secondary | ICD-10-CM | POA: Insufficient documentation

## 2011-09-25 LAB — CBC
HCT: 34.5 % — ABNORMAL LOW (ref 39.0–52.0)
Hemoglobin: 11.6 g/dL — ABNORMAL LOW (ref 13.0–17.0)
MCH: 28.3 pg (ref 26.0–34.0)
MCHC: 33.6 g/dL (ref 30.0–36.0)
MCV: 84.1 fL (ref 78.0–100.0)
Platelets: 147 10*3/uL — ABNORMAL LOW (ref 150–400)
RBC: 4.1 MIL/uL — ABNORMAL LOW (ref 4.22–5.81)
RDW: 14.5 % (ref 11.5–15.5)
WBC: 4.5 10*3/uL (ref 4.0–10.5)

## 2011-09-25 LAB — DIFFERENTIAL
Basophils Absolute: 0 10*3/uL (ref 0.0–0.1)
Basophils Relative: 0 % (ref 0–1)
Eosinophils Absolute: 0.2 10*3/uL (ref 0.0–0.7)
Eosinophils Relative: 5 % (ref 0–5)
Lymphocytes Relative: 18 % (ref 12–46)
Lymphs Abs: 0.8 10*3/uL (ref 0.7–4.0)
Monocytes Absolute: 0.5 10*3/uL (ref 0.1–1.0)
Monocytes Relative: 12 % (ref 3–12)
Neutro Abs: 2.9 10*3/uL (ref 1.7–7.7)
Neutrophils Relative %: 65 % (ref 43–77)

## 2011-09-25 LAB — COMPREHENSIVE METABOLIC PANEL
ALT: 13 U/L (ref 0–53)
AST: 16 U/L (ref 0–37)
Albumin: 3.8 g/dL (ref 3.5–5.2)
Alkaline Phosphatase: 95 U/L (ref 39–117)
BUN: 12 mg/dL (ref 6–23)
CO2: 22 mEq/L (ref 19–32)
Calcium: 8.9 mg/dL (ref 8.4–10.5)
Chloride: 102 mEq/L (ref 96–112)
Creatinine, Ser: 1.39 mg/dL — ABNORMAL HIGH (ref 0.50–1.35)
GFR calc Af Amer: 68 mL/min — ABNORMAL LOW (ref >90)
GFR calc non Af Amer: 59 mL/min — ABNORMAL LOW (ref >90)
Glucose, Bld: 97 mg/dL (ref 70–99)
Potassium: 3.9 mEq/L (ref 3.5–5.1)
Sodium: 134 mEq/L — ABNORMAL LOW (ref 135–145)
Total Bilirubin: 0.3 mg/dL (ref 0.3–1.2)
Total Protein: 7.6 g/dL (ref 6.0–8.3)

## 2011-09-25 MED ORDER — ALBUTEROL SULFATE (5 MG/ML) 0.5% IN NEBU
5.0000 mg | INHALATION_SOLUTION | Freq: Once | RESPIRATORY_TRACT | Status: AC
  Administered 2011-09-25: 2.5 mg via RESPIRATORY_TRACT
  Filled 2011-09-25: qty 0.5

## 2011-09-25 MED ORDER — ALBUTEROL SULFATE (5 MG/ML) 0.5% IN NEBU: 2.5000 mg | INHALATION_SOLUTION | Freq: Once | RESPIRATORY_TRACT | Status: DC

## 2011-09-25 MED ORDER — OXYCODONE-ACETAMINOPHEN 5-325 MG PO TABS
2.0000 | ORAL_TABLET | Freq: Once | ORAL | Status: AC
  Administered 2011-09-25: 2 via ORAL
  Filled 2011-09-25: qty 2

## 2011-09-25 MED ORDER — ACETAMINOPHEN 325 MG PO TABS
650.0000 mg | ORAL_TABLET | Freq: Once | ORAL | Status: AC
  Administered 2011-09-25: 650 mg via ORAL
  Filled 2011-09-25: qty 2

## 2011-09-25 MED ORDER — IPRATROPIUM BROMIDE 0.02 % IN SOLN
0.5000 mg | Freq: Once | RESPIRATORY_TRACT | Status: AC
  Administered 2011-09-25: 0.5 mg via RESPIRATORY_TRACT
  Filled 2011-09-25: qty 2.5

## 2011-09-25 NOTE — ED Notes (Signed)
Pt in xray

## 2011-09-25 NOTE — ED Notes (Signed)
Pt states he is still unable to provide urine specimen at this time

## 2011-09-25 NOTE — ED Notes (Signed)
Pt c/o fever, chills and cough with yellow sputum; pt sts some people he has been around have had similar sx; pt c/o body aches

## 2011-09-25 NOTE — ED Notes (Signed)
Attempted to collect urine but patient was unable to urinate. Gave patient urinal to use when he has the urge.

## 2011-09-25 NOTE — ED Notes (Signed)
Pt last took tylenol at 1400.

## 2011-09-25 NOTE — ED Notes (Signed)
Pt c/o headache. MD aware. States he will speak with pt. Encouraged pt again to provide urine specimen. States he is unable at this time

## 2011-09-25 NOTE — ED Provider Notes (Signed)
History     CSN: [id number]  Arrival date & time 09/25/11  4098   First MD Initiated Contact with Patient 09/25/11 2128      Chief Complaint  Patient presents with  . Influenza  . Cough    (Consider location/radiation/quality/duration/timing/severity/associated sxs/prior treatment) HPI Comments: Patient with history of HIV disease.  Started two days ago with productive cough, fever today to 102.6. He denies urinary complaints.  He tells me his CD4 counts are adequate.    Patient is a 51 y.o. male presenting with flu symptoms. The history is provided by the patient.  Influenza This is a new problem. The current episode started yesterday. The problem occurs constantly. The problem has been gradually worsening. The symptoms are aggravated by nothing. The symptoms are relieved by nothing. He has tried nothing for the symptoms.    Past Medical History  Diagnosis Date  . HIV (human immunodeficiency virus infection)   . Pneumonia 07/12/10    SEPTIC SHOCK  . Severe frontal headaches 08/16/10  . Chest pain 07/12/10  . Cyst     left nipple  . Asthma   . Hypertension   . HIV (human immunodeficiency virus infection)     Past Surgical History  Procedure Date  . Upper leg soft tissue biopsy     left thigh  . Knee arthroscopy     right  . Acne cyst removal   . Breast surgery     left retroareolar abscess i and d, biopsy    Family History  Problem Relation Age of Onset  . Stroke Mother   . Cancer Mother   . Stroke Father   . Diabetes Father   . Hypertension Sister     History  Substance Use Topics  . Smoking status: Former Smoker    Quit date: 05/22/2010  . Smokeless tobacco: Never Used  . Alcohol Use: No     rare      Review of Systems  All other systems reviewed and are negative.    Allergies  Penicillins and Sulfonamide derivatives  Home Medications   Current Outpatient Rx  Name Route Sig Dispense Refill  . ALBUTEROL SULFATE HFA 108 (90 BASE) MCG/ACT IN  AERS Inhalation Inhale 1-2 puffs into the lungs every 4 (four) hours as needed. For shortness of breath    . BUDESONIDE-FORMOTEROL FUMARATE 80-4.5 MCG/ACT IN AERO Inhalation Inhale 2 puffs into the lungs 2 (two) times daily. 1 Inhaler 11  . DARUNAVIR ETHANOLATE 400 MG PO TABS Oral Take 800 mg by mouth daily. With Norvir and Truvada    . DICYCLOMINE HCL 20 MG PO TABS Oral Take 20 mg by mouth 2 (two) times daily.    [name] EMTRICITABINE-TENOFOVIR 200-300 MG PO TABS Oral Take 1 tablet by mouth daily.    [name] FOLIC ACID 1 MG PO TABS Oral Take 1 tablet (1 mg total) by mouth daily. 30 tablet 11  . GABAPENTIN 600 MG PO TABS Oral Take 600 mg by mouth 2 (two) times daily.    [name] HYDROCODONE-ACETAMINOPHEN 5-500 MG PO TABS Oral Take 1 tablet by mouth every 8 (eight) hours as needed. For pain    . HYDROXYZINE PAMOATE 25 MG PO CAPS Oral Take 25 mg by mouth 3 (three) times daily as needed. For itching/One capsule by mouth every 6 hours when necessary    . MUPIROCIN 2 % EX OINT Topical Apply 1 application topically 2 (two) times daily.    [name] OMEPRAZOLE 40 MG PO CPDR Oral Take  40 mg by mouth 2 (two) times daily.    [name] RALTEGRAVIR POTASSIUM 400 MG PO TABS Oral Take 400 mg by mouth 2 (two) times daily.    [name] RITONAVIR 100 MG PO CAPS Oral Take 100 mg by mouth daily. With Prezista and Truvada    . TIZANIDINE HCL 4 MG PO CAPS Oral Take 4 mg by mouth every 6 (six) hours as needed. For muscle spasms    . ZOLPIDEM TARTRATE ER 12.5 MG PO TBCR Oral Take 12.5 mg by mouth at bedtime as needed. For sleep      BP 156/90  Pulse 113  Temp(Src) 102.1 F (38.9 C) (Oral)  Resp 15  SpO2 97%  Physical Exam  Nursing note and vitals reviewed. Constitutional: He is oriented to person, place, and time. He appears well-developed and well-nourished. No distress.  HENT:  Head: Normocephalic and atraumatic.  Mouth/Throat: Oropharynx is clear and moist.  Neck: Normal range of motion. Neck supple.  Cardiovascular: Normal rate and regular  rhythm.   No murmur heard. Pulmonary/Chest: Effort normal and breath sounds normal. No respiratory distress. He has no wheezes.  Abdominal: Soft. Bowel sounds are normal. He exhibits no distension. There is no tenderness.  Musculoskeletal: Normal range of motion. He exhibits no edema.  Lymphadenopathy:    He has no cervical adenopathy.  Neurological: He is alert and oriented to person, place, and time.  Skin: Skin is warm and dry. He is not diaphoretic.    ED Course  Procedures (including critical care time)   Labs Reviewed  CBC  DIFFERENTIAL  COMPREHENSIVE METABOLIC PANEL  CULTURE, BLOOD (ROUTINE X 2)  CULTURE, BLOOD (ROUTINE X 2)  URINALYSIS, ROUTINE W REFLEX MICROSCOPIC  URINE CULTURE   No results found.   No diagnosis found.    MDM  Labs and xray look okay.  Will treat for bronchitis with antibiotics.  I discussed inpatient versus outpatient treatment.  The patient wishes to go home, and I am comfortable with this.          Geoffery Lyons, MD 09/26/11 [phone]

## 2011-09-25 NOTE — ED Notes (Signed)
EDP at bedside to speak with pt. 

## 2011-09-25 NOTE — ED Notes (Signed)
Pt with sister at bedside. C/o fever, chills, fatigue and cough since yesterday. Pt placed on cardiac monitor. Call light in reach. Denies chest pain or other symptoms at this time. Denies further needs

## 2011-09-26 MED ORDER — MOXIFLOXACIN HCL 400 MG PO TABS
400.0000 mg | ORAL_TABLET | Freq: Once | ORAL | Status: AC
  Administered 2011-09-26: 400 mg via ORAL
  Filled 2011-09-26: qty 1

## 2011-09-26 MED ORDER — MOXIFLOXACIN HCL 400 MG PO TABS: 400.0000 mg | ORAL_TABLET | Freq: Every day | ORAL | Status: AC

## 2011-09-26 MED ORDER — OXYCODONE-ACETAMINOPHEN 5-325 MG PO TABS: 2.0000 | ORAL_TABLET | Freq: Once | ORAL | Status: DC

## 2011-09-26 MED ORDER — OXYCODONE-ACETAMINOPHEN 5-325 MG PO TABS: 1.0000 | ORAL_TABLET | Freq: Four times a day (QID) | ORAL | Status: DC | PRN

## 2011-09-26 NOTE — ED Notes (Signed)
Prescription for avelox changed due to med not being covered by medicaid; med changed to levaquin 500mg  po daily x10 days per dr Oletta Lamas; [facility] pharmacy notified at [phone].

## 2011-09-26 NOTE — ED Notes (Signed)
Pt did not provide urine sample. MD aware.

## 2011-09-26 NOTE — ED Notes (Signed)
Pt given rx x 2, discharge and follow up isntructions without further quesitons after speaking to provider. Discussed with pt and family that pt may not drive while taking pain medication. Pt and family deny further needs at this time

## 2011-09-26 NOTE — Discharge Instructions (Signed)

## 2011-09-27 ENCOUNTER — Other Ambulatory Visit: Payer: Medicaid Other

## 2011-09-27 ENCOUNTER — Other Ambulatory Visit (HOSPITAL_COMMUNITY)
Admission: RE | Admit: 2011-09-27 | Discharge: 2011-09-27 | Disposition: A | Discharge status: Home / Self Care | Payer: Medicaid Other | Source: Ambulatory Visit | Attending: Infectious Diseases | Admitting: Infectious Diseases

## 2011-09-27 DIAGNOSIS — Z7721 Contact with and (suspected) exposure to potentially hazardous body fluids: Secondary | ICD-10-CM

## 2011-09-27 DIAGNOSIS — Z79899 Other long term (current) drug therapy: Secondary | ICD-10-CM

## 2011-09-27 DIAGNOSIS — B20 Human immunodeficiency virus [HIV] disease: Secondary | ICD-10-CM

## 2011-09-27 DIAGNOSIS — Z113 Encounter for screening for infections with a predominantly sexual mode of transmission: Secondary | ICD-10-CM | POA: Insufficient documentation

## 2011-09-27 LAB — LIPID PANEL
Cholesterol: 145 mg/dL (ref 0–200)
HDL: 48 mg/dL (ref >39)
LDL Cholesterol: 73 mg/dL (ref 0–99)
Total CHOL/HDL Ratio: 3 Ratio
Triglycerides: 122 mg/dL (ref <150)
VLDL: 24 mg/dL (ref 0–40)

## 2011-09-27 LAB — COMPLETE METABOLIC PANEL WITH GFR
ALT: 12 U/L (ref 0–53)
AST: 21 U/L (ref 0–37)
Albumin: 4 g/dL (ref 3.5–5.2)
Alkaline Phosphatase: 103 U/L (ref 39–117)
BUN: 16 mg/dL (ref 6–23)
CO2: 26 mEq/L (ref 19–32)
Calcium: 8.4 mg/dL (ref 8.4–10.5)
Chloride: 104 mEq/L (ref 96–112)
Creat: 1.68 mg/dL — ABNORMAL HIGH (ref 0.50–1.35)
GFR, Est African American: 55 mL/min — ABNORMAL LOW
GFR, Est Non African American: 47 mL/min — ABNORMAL LOW
Glucose, Bld: 97 mg/dL (ref 70–99)
Potassium: 4.3 mEq/L (ref 3.5–5.3)
Sodium: 137 mEq/L (ref 135–145)
Total Bilirubin: 0.3 mg/dL (ref 0.3–1.2)
Total Protein: 7.5 g/dL (ref 6.0–8.3)

## 2011-09-27 LAB — CBC
HCT: 36.4 % — ABNORMAL LOW (ref 39.0–52.0)
Hemoglobin: 11.6 g/dL — ABNORMAL LOW (ref 13.0–17.0)
MCH: 28.4 pg (ref 26.0–34.0)
MCHC: 31.9 g/dL (ref 30.0–36.0)
MCV: 89 fL (ref 78.0–100.0)
Platelets: 147 10*3/uL — ABNORMAL LOW (ref 150–400)
RBC: 4.09 MIL/uL — ABNORMAL LOW (ref 4.22–5.81)
RDW: 14.6 % (ref 11.5–15.5)
WBC: 3 10*3/uL — ABNORMAL LOW (ref 4.0–10.5)

## 2011-09-27 LAB — RPR

## 2011-09-28 LAB — T-HELPER CELL (CD4) - (RCID CLINIC ONLY)
CD4 % Helper T Cell: 17 % — ABNORMAL LOW (ref 33–55)
CD4 T Cell Abs: 190 uL — ABNORMAL LOW (ref 400–2700)

## 2011-09-29 LAB — HIV-1 RNA QUANT-NO REFLEX-BLD
HIV 1 RNA Quant: <20 copies/mL (ref <20)
HIV-1 RNA Quant, Log: <1.3 {Log} (ref <1.30)

## 2011-10-02 LAB — CULTURE, BLOOD (ROUTINE X 2)
Culture  Setup Time: 201303260216
Culture  Setup Time: 201303260217
Culture: NO GROWTH
Culture: NO GROWTH

## 2011-10-03 ENCOUNTER — Other Ambulatory Visit: Payer: Self-pay | Admitting: Infectious Diseases

## 2011-10-03 DIAGNOSIS — B20 Human immunodeficiency virus [HIV] disease: Secondary | ICD-10-CM

## 2011-10-03 LAB — HLA B*5701: HLA-B*5701: NEGATIVE

## 2011-10-04 ENCOUNTER — Ambulatory Visit (INDEPENDENT_AMBULATORY_CARE_PROVIDER_SITE_OTHER): Payer: Medicaid Other | Admitting: Infectious Diseases

## 2011-10-04 ENCOUNTER — Encounter: Payer: Self-pay | Admitting: Infectious Diseases

## 2011-10-04 ENCOUNTER — Other Ambulatory Visit: Payer: Medicaid Other

## 2011-10-04 ENCOUNTER — Encounter: Payer: Self-pay | Admitting: *Deleted

## 2011-10-04 VITALS — BP 132/91 | HR 76 | Temp 97.7°F | Wt 210.0 lb

## 2011-10-04 DIAGNOSIS — R112 Nausea with vomiting, unspecified: Secondary | ICD-10-CM

## 2011-10-04 DIAGNOSIS — B20 Human immunodeficiency virus [HIV] disease: Secondary | ICD-10-CM

## 2011-10-04 LAB — BASIC METABOLIC PANEL
BUN: 15 mg/dL (ref 6–23)
CO2: 24 mEq/L (ref 19–32)
Calcium: 9.3 mg/dL (ref 8.4–10.5)
Chloride: 107 mEq/L (ref 96–112)
Creat: 1.34 mg/dL (ref 0.50–1.35)
Glucose, Bld: 102 mg/dL — ABNORMAL HIGH (ref 70–99)
Potassium: 3.8 mEq/L (ref 3.5–5.3)
Sodium: 140 mEq/L (ref 135–145)

## 2011-10-04 MED ORDER — ONDANSETRON HCL 4 MG PO TABS: 4.0000 mg | ORAL_TABLET | Freq: Every day | ORAL | Status: DC | PRN
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
## 2011-10-04 NOTE — Assessment & Plan Note (Signed)
Suspect this is related to his avelox. Given that he had NL CBC, CXR, his 8 days of rx is more than enough. Will stop this and give him zofran which he states he has previously taken without difficulty. rtc prn

## 2011-10-05 ENCOUNTER — Encounter: Payer: Self-pay | Admitting: Internal Medicine

## 2011-10-05 ENCOUNTER — Encounter: Payer: Self-pay | Admitting: Infectious Diseases

## 2011-10-05 ENCOUNTER — Ambulatory Visit (INDEPENDENT_AMBULATORY_CARE_PROVIDER_SITE_OTHER): Payer: Medicaid Other | Admitting: Internal Medicine

## 2011-10-05 VITALS — BP 126/85 | HR 66 | Temp 97.1°F | Ht 69.0 in | Wt 212.7 lb

## 2011-10-05 DIAGNOSIS — N182 Chronic kidney disease, stage 2 (mild): Secondary | ICD-10-CM

## 2011-10-05 DIAGNOSIS — G4700 Insomnia, unspecified: Secondary | ICD-10-CM

## 2011-10-05 DIAGNOSIS — I10 Essential (primary) hypertension: Secondary | ICD-10-CM

## 2011-10-05 DIAGNOSIS — B20 Human immunodeficiency virus [HIV] disease: Secondary | ICD-10-CM

## 2011-10-05 LAB — COMPLETE METABOLIC PANEL WITH GFR
ALT: 17 U/L (ref 0–53)
AST: 22 U/L (ref 0–37)
Albumin: 4.3 g/dL (ref 3.5–5.2)
Alkaline Phosphatase: 93 U/L (ref 39–117)
BUN: 13 mg/dL (ref 6–23)
CO2: 26 mEq/L (ref 19–32)
Calcium: 9.2 mg/dL (ref 8.4–10.5)
Chloride: 107 mEq/L (ref 96–112)
Creat: 1.29 mg/dL (ref 0.50–1.35)
GFR, Est African American: 75 mL/min
GFR, Est Non African American: 65 mL/min
Glucose, Bld: 105 mg/dL — ABNORMAL HIGH (ref 70–99)
Potassium: 4.2 mEq/L (ref 3.5–5.3)
Sodium: 139 mEq/L (ref 135–145)
Total Bilirubin: 0.7 mg/dL (ref 0.3–1.2)
Total Protein: 7.5 g/dL (ref 6.0–8.3)

## 2011-10-05 MED ORDER — TRAZODONE HCL 50 MG PO TABS: 50.0000 mg | ORAL_TABLET | Freq: Every day | ORAL | Status: DC

## 2011-10-05 MED ORDER — OXYCODONE-ACETAMINOPHEN 5-325 MG PO TABS: 1.0000 | ORAL_TABLET | Freq: Four times a day (QID) | ORAL | Status: AC | PRN

## 2011-10-05 NOTE — Patient Instructions (Signed)
Please, pick your prescription for Trazadone at your pharmacy. Please, call Ms. Kay on Tuesday to follow up on prior Authorization for Ambiene CR. Please, follow up with Dr. Allena Katz (kidney specialist). Call with any questions and follow up in 1-3 months. Happy Spring!!!!

## 2011-10-06 LAB — URINALYSIS, MICROSCOPIC ONLY
Bacteria, UA: NONE SEEN
Casts: NONE SEEN
Crystals: NONE SEEN
Squamous Epithelial / LPF: NONE SEEN

## 2011-10-06 LAB — URINALYSIS, ROUTINE W REFLEX MICROSCOPIC
Bilirubin Urine: NEGATIVE
Glucose, UA: NEGATIVE mg/dL
Hgb urine dipstick: NEGATIVE
Ketones, ur: NEGATIVE mg/dL
Leukocytes, UA: NEGATIVE
Nitrite: NEGATIVE
Protein, ur: 30 mg/dL — AB
Specific Gravity, Urine: 1.017 (ref 1.005–1.030)
Urobilinogen, UA: 0.2 mg/dL (ref 0.0–1.0)
pH: 5.5 (ref 5.0–8.0)

## 2011-10-06 LAB — PRESCRIPTION ABUSE MONITORING 15P, URINE
Amphetamine/Meth: NEGATIVE ng/mL
Barbiturate Screen, Urine: NEGATIVE ng/mL
Benzodiazepine Screen, Urine: NEGATIVE ng/mL
Buprenorphine, Urine: NEGATIVE ng/mL
Cannabinoid Scrn, Ur: NEGATIVE ng/mL
Carisoprodol, Urine: NEGATIVE ng/mL
Cocaine Metabolites: NEGATIVE ng/mL
Creatinine, Urine: 225.58 mg/dL (ref >20.0)
Fentanyl, Ur: NEGATIVE ng/mL
Meperidine, Ur: NEGATIVE ng/mL
Methadone Screen, Urine: NEGATIVE ng/mL
Opiate Screen, Urine: NEGATIVE ng/mL
Oxycodone Screen, Ur: NEGATIVE ng/mL
Propoxyphene: NEGATIVE ng/mL
Tramadol Scrn, Ur: NEGATIVE ng/mL
Zolpidem, Urine: NEGATIVE ng/mL

## 2011-10-06 LAB — MICROALBUMIN / CREATININE URINE RATIO
Creatinine, Urine: 233.5 mg/dL
Microalb Creat Ratio: 72.3 mg/g — ABNORMAL HIGH (ref 0.0–30.0)
Microalb, Ur: 16.88 mg/dL — ABNORMAL HIGH (ref 0.00–1.89)

## 2011-10-06 LAB — T-HELPER CELLS (CD4) COUNT (NOT AT ARMC)
CD4 % Helper T Cell: 14 % — ABNORMAL LOW (ref 33–55)
CD4 T Cell Abs: 250 uL — ABNORMAL LOW (ref 400–2700)

## 2011-10-09 ENCOUNTER — Encounter: Payer: Self-pay | Admitting: Infectious Diseases

## 2011-10-09 ENCOUNTER — Encounter: Payer: Self-pay | Admitting: Internal Medicine

## 2011-10-09 LAB — HIV-1 RNA QUANT-NO REFLEX-BLD
HIV 1 RNA Quant: 70 copies/mL — ABNORMAL HIGH (ref <20)
HIV-1 RNA Quant, Log: 1.85 {Log} — ABNORMAL HIGH (ref <1.30)
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
## 2011-10-11 ENCOUNTER — Ambulatory Visit (INDEPENDENT_AMBULATORY_CARE_PROVIDER_SITE_OTHER): Payer: Medicaid Other | Admitting: Infectious Diseases

## 2011-10-11 ENCOUNTER — Encounter: Payer: Self-pay | Admitting: Infectious Diseases

## 2011-10-11 ENCOUNTER — Other Ambulatory Visit (INDEPENDENT_AMBULATORY_CARE_PROVIDER_SITE_OTHER): Payer: Medicaid Other | Admitting: *Deleted

## 2011-10-11 ENCOUNTER — Ambulatory Visit: Payer: Medicaid Other | Admitting: Infectious Diseases

## 2011-10-11 VITALS — BP 151/77 | HR 99 | Temp 98.4°F | Ht 69.0 in | Wt 215.8 lb

## 2011-10-11 DIAGNOSIS — B20 Human immunodeficiency virus [HIV] disease: Secondary | ICD-10-CM

## 2011-10-11 DIAGNOSIS — E538 Deficiency of other specified B group vitamins: Secondary | ICD-10-CM

## 2011-10-11 MED ORDER — CYANOCOBALAMIN 1000 MCG/ML IJ SOLN
1000.0000 ug | Freq: Once | INTRAMUSCULAR | Status: AC
  Administered 2011-10-11: 1000 ug via INTRAMUSCULAR

## 2011-10-11 NOTE — Assessment & Plan Note (Signed)
Improved today. We discussed that he can continue on his current ART at current dose, frequency.
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
## 2011-10-11 NOTE — Assessment & Plan Note (Signed)
He is here with his family member, he is not seen with any other He is doing well. He has continued low level viremia, explained to pt and his family that reasons for this are resistance, missed ART. Discussed long term studies have shown that low level viremia predicts failure. Unfortunately, with his level of viremia, resistance testing cannot be done (need VL >1000). Will watch him on his current (salvage) tx. See him back in 3-4 months. Offered/refused condoms.  He asks to be started on B12 injections- i explained his lab work to him, he has a normal MCV, and that it is unlikely he has a deficiency. He still requests B12 for his fatigue. Injection given.  They voiced agreement with this plan as well as their appreciation for my help with his disability application.

## 2011-10-11 NOTE — Assessment & Plan Note (Signed)
My great appreciation to the IM clinic in partnering with me in the care of this patient.

## 2011-10-25 ENCOUNTER — Other Ambulatory Visit: Payer: Self-pay | Admitting: Gastroenterology

## 2011-10-27 ENCOUNTER — Ambulatory Visit
Admission: RE | Admit: 2011-10-27 | Discharge: 2011-10-27 | Disposition: A | Discharge status: Home / Self Care | Payer: Medicaid Other | Source: Ambulatory Visit | Attending: Gastroenterology | Admitting: Gastroenterology

## 2011-11-02 ENCOUNTER — Other Ambulatory Visit (HOSPITAL_COMMUNITY): Payer: Self-pay | Admitting: Gastroenterology

## 2011-11-02 DIAGNOSIS — R1010 Upper abdominal pain, unspecified: Secondary | ICD-10-CM

## 2011-11-07 ENCOUNTER — Telehealth: Payer: Self-pay | Admitting: *Deleted

## 2011-11-07 NOTE — Telephone Encounter (Signed)
Sister called for pt  - problems with breathing recently - has inhalers. Due to weather and mold in house. Appt given 11/08/11 10:15AM Dr Arvilla Market - if changes in breathiong - needs to go to ER. Stanton Kidney Indiana Pechacek RN 11/07/11 11:15AM

## 2011-11-08 ENCOUNTER — Ambulatory Visit (INDEPENDENT_AMBULATORY_CARE_PROVIDER_SITE_OTHER): Payer: Medicaid Other | Admitting: Internal Medicine

## 2011-11-08 ENCOUNTER — Encounter: Payer: Self-pay | Admitting: Internal Medicine

## 2011-11-08 DIAGNOSIS — J45909 Unspecified asthma, uncomplicated: Secondary | ICD-10-CM

## 2011-11-08 DIAGNOSIS — J309 Allergic rhinitis, unspecified: Secondary | ICD-10-CM

## 2011-11-08 MED ORDER — FLUTICASONE PROPIONATE 50 MCG/ACT NA SUSP: 1.0000 | Freq: Every day | NASAL | Status: DC

## 2011-11-08 MED ORDER — ALBUTEROL SULFATE (5 MG/ML) 0.5% IN NEBU
2.5000 mg | INHALATION_SOLUTION | Freq: Once | RESPIRATORY_TRACT | Status: AC
  Administered 2011-11-08: 2.5 mg via RESPIRATORY_TRACT

## 2011-11-08 MED ORDER — CYANOCOBALAMIN 1000 MCG/ML IJ SOLN
1000.0000 ug | Freq: Once | INTRAMUSCULAR | Status: AC
  Administered 2011-11-08: 1000 ug via INTRAMUSCULAR

## 2011-11-08 MED ORDER — LORATADINE 10 MG PO TABS: 10.0000 mg | ORAL_TABLET | Freq: Every day | ORAL | Status: DC

## 2011-11-08 NOTE — Assessment & Plan Note (Signed)
Patient symptoms of increased sinus congestion, runny nose, hoarseness (not resolved), frontal headache, frequent sneezing, and itchy eyes are consistent with allergic rhinitis.  He is currently not wheezing on exam and is otherwise without evidence of acute asthma exacerbation. He is also afebrile and hemodynamically stable; I do not believe he is suffering from an acute infectious process.   Will start him on daily antihistamine therapy with loratadine. We'll also prescribe a nasal steroid and advised patient to use nasal saline twice a day.  Advised him to continue to use inhalers as previously directed. We'll provide him a prescription for spacer 2 ensure he is receiving the full dose of his medications.  Check a CBC with differential today to evaluate for the presence of eosinophilia; now this seems highly unlikely given this overall diminished immunity.  She requests a B12 shot today to help with his energy; well there is no evidence to support this, I see no problems in providing this injection today if he feels he drives significant benefit from it.  Patient is advised to return to the clinic or the emergency room he developed fever, difficulty breathing, productive cough, hemoptysis, syncope, or other concerning complaint.  He is very concerned about the mold in his apartment and states he has attempted to discuss this with his property manager who was not open to discussing any maintenance or move to apt without water damage and mold.  Will write a letter to patient properly managed today in support of patient; it is possible his symptoms would improve if exposure to mold minimized.

## 2011-11-08 NOTE — Progress Notes (Signed)
  Subjective:     Patient ID: Casey Brennan, male   DOB: Feb 17, 1961, 51 y.o.   MRN: [id number]  HPI  patient presents today with acute complaint of increased cough and some shortness of breath.   Patient reports new finding of mold in his apt.  He reports 2 weeks of hoarseness and SOB.  He notes this symptoms are slightly improved.  He notes increased fatigue and DOE.  He notes the inhalers provide some short term relief.  He describes some perisistent wheezing.  He describes a dry cough.  Denies fevers, chills, and hempotypsis.  He currently has a headache he describes as frontal.  He also reports frequent sneezing.    He denies any sick contacts, or recent travel.  He has not missed any doses of his antiretrovirals.    Last CD4 250 with VL 70 as of 10/2011.   Review of Systems  Constitutional: Negative for fever, chills, diaphoresis, activity change, appetite change, fatigue and unexpected weight change.  HENT: Negative for hearing loss, congestion and neck stiffness.   Eyes: Negative for photophobia, pain and visual disturbance.  Respiratory: Positive for dry cough,  shortness of breath and wheezing.   Cardiovascular: Negative for chest pain and palpitations.  Gastrointestinal: Negative for abdominal pain, blood in stool and anal bleeding.  Genitourinary: Negative for dysuria, hematuria and difficulty urinating.  Musculoskeletal: Negative for joint swelling.  Neurological: Negative for dizziness, syncope, speech difficulty, weakness, numbness and headaches.      Objective:   Physical Exam VItal signs reviewed and stable. GEN: No acute distress  Alert and oriented x 3.  Pleasant, conversant, and cooperative to exam. HEENT: head is autraumatic and normocephalic.  Neck is supple without palpable masses or lymphadenopathy.  No JVD or carotid bruits.  Vision intact.  EOMI.  PERRLA.  Sclerae anicteric.  Conjunctivae without pallor or injection. Mucous membranes are moist.  Oropharynx is  without erythema, exudates, or other abnormal lesions. Bilateral ear canals are clear; TMs within normal limits bilaterally RESP:  Lungs are clear to auscultation bilaterally  No wheezes, ronchi, or rubs. CARDIOVASCULAR: Regular rate, normal rhythm.  Clear S1, S2, no murmurs, gallops, or rubs. ABDOMEN: soft, non-tender, non-distended.  Bowels sounds present in all quadrants and normoactive.  No palpable masses.[name] SKIN: warm and dry with normal turgor.  No rashes or abnormal lesions observed. NEURO: CN II-XII grossly intact.  Muscle strength +5/5 in bilateral upper and lower extremities.      Assessment:

## 2011-11-08 NOTE — Patient Instructions (Signed)
Schedule a followup appointment with your primary care provider in one to 2 months, sooner if needed. Use nasal saline twice a day to clear allergy particles from your nose. Flonase is a nasal steroid that will help with your sinus congestion and headache.  Use as directed You have been given a prescription for a spacer. This is to use with your inhalers. This will help ensure you get the full dose of your inhaled medications. Loratadine is a new medicine to help with  allergies. Take one pill every day regardless of how you are feeling. Continue to take all of her medicines as directed. Please don't hesitate to call the clinic at [phone] with any concerns or questions. I hope you're feeling better soon!

## 2011-11-09 LAB — CBC WITH DIFFERENTIAL/PLATELET

## 2011-11-10 ENCOUNTER — Encounter (HOSPITAL_COMMUNITY): Payer: Self-pay

## 2011-11-10 ENCOUNTER — Encounter (HOSPITAL_COMMUNITY)
Admission: RE | Admit: 2011-11-10 | Discharge: 2011-11-10 | Disposition: A | Discharge status: Home / Self Care | Payer: Medicaid Other | Source: Ambulatory Visit | Attending: Gastroenterology | Admitting: Gastroenterology

## 2011-11-10 DIAGNOSIS — R109 Unspecified abdominal pain: Secondary | ICD-10-CM | POA: Insufficient documentation

## 2011-11-10 DIAGNOSIS — R1010 Upper abdominal pain, unspecified: Secondary | ICD-10-CM

## 2011-11-10 MED ORDER — TECHNETIUM TC 99M MEBROFENIN IV KIT
5.5000 | PACK | Freq: Once | INTRAVENOUS | Status: AC | PRN
  Administered 2011-11-10: 5.5 via INTRAVENOUS

## 2011-11-10 MED ORDER — SINCALIDE 5 MCG IJ SOLR: 0.0200 ug/kg | Freq: Once | INTRAMUSCULAR | Status: DC

## 2011-11-17 ENCOUNTER — Other Ambulatory Visit (HOSPITAL_COMMUNITY): Payer: Self-pay | Admitting: *Deleted

## 2011-11-20 ENCOUNTER — Encounter (HOSPITAL_COMMUNITY)
Admission: RE | Admit: 2011-11-20 | Discharge: 2011-11-20 | Disposition: A | Discharge status: Home / Self Care | Payer: Medicaid Other | Source: Ambulatory Visit | Attending: Nephrology | Admitting: Nephrology

## 2011-11-20 DIAGNOSIS — D509 Iron deficiency anemia, unspecified: Secondary | ICD-10-CM | POA: Insufficient documentation

## 2011-11-20 MED ORDER — SODIUM CHLORIDE 0.9 % IV SOLN
1020.0000 mg | Freq: Once | INTRAVENOUS | Status: AC
  Administered 2011-11-20: 1020 mg via INTRAVENOUS
  Filled 2011-11-20: qty 34

## 2011-11-29 ENCOUNTER — Emergency Department (HOSPITAL_COMMUNITY): Payer: Medicaid Other

## 2011-11-29 ENCOUNTER — Encounter (HOSPITAL_COMMUNITY): Payer: Self-pay | Admitting: Emergency Medicine

## 2011-11-29 ENCOUNTER — Emergency Department (HOSPITAL_COMMUNITY)
Admission: EM | Admit: 2011-11-29 | Discharge: 2011-11-29 | Disposition: A | Discharge status: Home / Self Care | Payer: Medicaid Other | Attending: Emergency Medicine | Admitting: Emergency Medicine

## 2011-11-29 DIAGNOSIS — Z21 Asymptomatic human immunodeficiency virus [HIV] infection status: Secondary | ICD-10-CM | POA: Insufficient documentation

## 2011-11-29 DIAGNOSIS — I10 Essential (primary) hypertension: Secondary | ICD-10-CM | POA: Insufficient documentation

## 2011-11-29 DIAGNOSIS — J45909 Unspecified asthma, uncomplicated: Secondary | ICD-10-CM | POA: Insufficient documentation

## 2011-11-29 DIAGNOSIS — R51 Headache: Secondary | ICD-10-CM | POA: Insufficient documentation

## 2011-11-29 DIAGNOSIS — R05 Cough: Secondary | ICD-10-CM

## 2011-11-29 DIAGNOSIS — R059 Cough, unspecified: Secondary | ICD-10-CM | POA: Insufficient documentation

## 2011-11-29 DIAGNOSIS — Z79899 Other long term (current) drug therapy: Secondary | ICD-10-CM | POA: Insufficient documentation

## 2011-11-29 DIAGNOSIS — B20 Human immunodeficiency virus [HIV] disease: Secondary | ICD-10-CM

## 2011-11-29 DIAGNOSIS — IMO0001 Reserved for inherently not codable concepts without codable children: Secondary | ICD-10-CM | POA: Insufficient documentation

## 2011-11-29 DIAGNOSIS — R509 Fever, unspecified: Secondary | ICD-10-CM | POA: Insufficient documentation

## 2011-11-29 DIAGNOSIS — R0602 Shortness of breath: Secondary | ICD-10-CM | POA: Insufficient documentation

## 2011-11-29 DIAGNOSIS — J3489 Other specified disorders of nose and nasal sinuses: Secondary | ICD-10-CM | POA: Insufficient documentation

## 2011-11-29 DIAGNOSIS — R5381 Other malaise: Secondary | ICD-10-CM | POA: Insufficient documentation

## 2011-11-29 DIAGNOSIS — M2550 Pain in unspecified joint: Secondary | ICD-10-CM | POA: Insufficient documentation

## 2011-11-29 LAB — CBC
HCT: 36.5 % — ABNORMAL LOW (ref 39.0–52.0)
Hemoglobin: 12.3 g/dL — ABNORMAL LOW (ref 13.0–17.0)
MCH: 29 pg (ref 26.0–34.0)
MCHC: 33.7 g/dL (ref 30.0–36.0)
MCV: 86.1 fL (ref 78.0–100.0)
Platelets: 164 10*3/uL (ref 150–400)
RBC: 4.24 MIL/uL (ref 4.22–5.81)
RDW: 17 % — ABNORMAL HIGH (ref 11.5–15.5)
WBC: 5 10*3/uL (ref 4.0–10.5)

## 2011-11-29 LAB — URINE MICROSCOPIC-ADD ON

## 2011-11-29 LAB — COMPREHENSIVE METABOLIC PANEL
ALT: 14 U/L (ref 0–53)
AST: 20 U/L (ref 0–37)
Albumin: 3.8 g/dL (ref 3.5–5.2)
Alkaline Phosphatase: 97 U/L (ref 39–117)
BUN: 8 mg/dL (ref 6–23)
CO2: 22 mEq/L (ref 19–32)
Calcium: 9.3 mg/dL (ref 8.4–10.5)
Chloride: 101 mEq/L (ref 96–112)
Creatinine, Ser: 1.27 mg/dL (ref 0.50–1.35)
GFR calc Af Amer: 76 mL/min — ABNORMAL LOW (ref >90)
GFR calc non Af Amer: 65 mL/min — ABNORMAL LOW (ref >90)
Glucose, Bld: 87 mg/dL (ref 70–99)
Potassium: 3.8 mEq/L (ref 3.5–5.1)
Sodium: 136 mEq/L (ref 135–145)
Total Bilirubin: 0.3 mg/dL (ref 0.3–1.2)
Total Protein: 7.8 g/dL (ref 6.0–8.3)

## 2011-11-29 LAB — DIFFERENTIAL
Basophils Absolute: 0 10*3/uL (ref 0.0–0.1)
Basophils Relative: 0 % (ref 0–1)
Eosinophils Absolute: 0.2 10*3/uL (ref 0.0–0.7)
Eosinophils Relative: 4 % (ref 0–5)
Lymphocytes Relative: 28 % (ref 12–46)
Lymphs Abs: 1.4 10*3/uL (ref 0.7–4.0)
Monocytes Absolute: 0.7 10*3/uL (ref 0.1–1.0)
Monocytes Relative: 14 % — ABNORMAL HIGH (ref 3–12)
Neutro Abs: 2.8 10*3/uL (ref 1.7–7.7)
Neutrophils Relative %: 55 % (ref 43–77)

## 2011-11-29 LAB — URINALYSIS, ROUTINE W REFLEX MICROSCOPIC
Bilirubin Urine: NEGATIVE
Glucose, UA: NEGATIVE mg/dL
Hgb urine dipstick: NEGATIVE
Ketones, ur: NEGATIVE mg/dL
Nitrite: NEGATIVE
Protein, ur: NEGATIVE mg/dL
Specific Gravity, Urine: 1.011 (ref 1.005–1.030)
Urobilinogen, UA: 0.2 mg/dL (ref 0.0–1.0)
pH: 6.5 (ref 5.0–8.0)

## 2011-11-29 LAB — LACTATE DEHYDROGENASE: LDH: 259 U/L — ABNORMAL HIGH (ref 94–250)

## 2011-11-29 LAB — LACTIC ACID, PLASMA: Lactic Acid, Venous: 0.8 mmol/L (ref 0.5–2.2)

## 2011-11-29 LAB — RAPID STREP SCREEN (MED CTR MEBANE ONLY): Streptococcus, Group A Screen (Direct): NEGATIVE

## 2011-11-29 MED ORDER — SODIUM CHLORIDE 0.9 % IV BOLUS (SEPSIS)
1000.0000 mL | Freq: Once | INTRAVENOUS | Status: AC
  Administered 2011-11-29: 1000 mL via INTRAVENOUS

## 2011-11-29 MED ORDER — IOHEXOL 350 MG/ML SOLN
160.0000 mL | Freq: Once | INTRAVENOUS | Status: AC | PRN
  Administered 2011-11-29: 160 mL via INTRAVENOUS

## 2011-11-29 MED ORDER — AZITHROMYCIN 250 MG PO TABS: 250.0000 mg | ORAL_TABLET | Freq: Every day | ORAL | Status: AC

## 2011-11-29 MED ORDER — KETOROLAC TROMETHAMINE 30 MG/ML IJ SOLN
30.0000 mg | Freq: Once | INTRAMUSCULAR | Status: AC
  Administered 2011-11-29: 30 mg via INTRAVENOUS
  Filled 2011-11-29: qty 1

## 2011-11-29 MED ORDER — ONDANSETRON HCL 4 MG/2ML IJ SOLN
4.0000 mg | Freq: Once | INTRAMUSCULAR | Status: AC
  Administered 2011-11-29: 4 mg via INTRAVENOUS
  Filled 2011-11-29: qty 2

## 2011-11-29 MED ORDER — IBUPROFEN 800 MG PO TABS
800.0000 mg | ORAL_TABLET | Freq: Once | ORAL | Status: AC
  Administered 2011-11-29: 800 mg via ORAL
  Filled 2011-11-29: qty 1

## 2011-11-29 MED ORDER — ALBUTEROL SULFATE HFA 108 (90 BASE) MCG/ACT IN AERS: 2.0000 | INHALATION_SPRAY | RESPIRATORY_TRACT | Status: DC | PRN

## 2011-11-29 MED ORDER — ACETAMINOPHEN 325 MG PO TABS
650.0000 mg | ORAL_TABLET | Freq: Once | ORAL | Status: AC
  Administered 2011-11-29: 650 mg via ORAL
  Filled 2011-11-29: qty 2

## 2011-11-29 NOTE — ED Notes (Signed)
Pt. Discharged to home with family, pt. Alert and oriented, NAD noted 

## 2011-11-29 NOTE — ED Notes (Signed)
Received report, pt. Resting on stretcher, NAD noted 

## 2011-11-29 NOTE — ED Provider Notes (Signed)
History     CSN: [id number]  Arrival date & time 11/29/11  1433   First MD Initiated Contact with Patient 11/29/11 1857      Chief Complaint  Patient presents with  . Fever    102  . Shortness of Breath  . Cough    (Consider location/radiation/quality/duration/timing/severity/associated sxs/prior treatment) HPI Comments: Patient presents with 3 days of body aches, headache, fever to 102, chills, cough, shortness of breath. She does began after he was working outside. He has a history of HIV with a CD4 of 190. He is not on any prophylactic antibiotics. He denies any abdominal pain, nausea or vomiting. He has anterior chest pain with coughing only. He denies any rashes. He has no neck pain or stiffness. Denies any sick contacts. He states compliance with medications.  The history is provided by the patient.    Past Medical History  Diagnosis Date  . HIV (human immunodeficiency virus infection)   . Pneumonia 07/12/10    SEPTIC SHOCK  . Severe frontal headaches 08/16/10  . Chest pain 07/12/10  . Cyst     left nipple  . Asthma   . Hypertension   . HIV (human immunodeficiency virus infection)   . Gastric ulcer     Past Surgical History  Procedure Date  . Upper leg soft tissue biopsy     left thigh  . Knee arthroscopy     right  . Acne cyst removal   . Breast surgery     left retroareolar abscess i and d, biopsy    Family History  Problem Relation Age of Onset  . Stroke Mother   . Cancer Mother   . Stroke Father   . Diabetes Father   . Hypertension Sister     History  Substance Use Topics  . Smoking status: Former Smoker    Quit date: 05/22/2010  . Smokeless tobacco: Never Used  . Alcohol Use: No     rare      Review of Systems  Constitutional: Positive for fever, chills, activity change and fatigue.  HENT: Positive for congestion and rhinorrhea. Negative for neck pain and neck stiffness.   Respiratory: Positive for cough and shortness of breath.     Cardiovascular: Negative for chest pain.  Gastrointestinal: Negative for nausea, abdominal pain and rectal pain.  Genitourinary: Negative for dysuria and hematuria.  Musculoskeletal: Positive for myalgias and arthralgias.  Skin: Negative for rash.  Neurological: Positive for headaches. Negative for dizziness and light-headedness.    Allergies  Penicillins and Sulfonamide derivatives  Home Medications   Current Outpatient Rx  Name Route Sig Dispense Refill  . ALBUTEROL SULFATE HFA 108 (90 BASE) MCG/ACT IN AERS Inhalation Inhale 1-2 puffs into the lungs every 4 (four) hours as needed. For shortness of breath    . BUDESONIDE-FORMOTEROL FUMARATE 80-4.5 MCG/ACT IN AERO Inhalation Inhale 2 puffs into the lungs 2 (two) times daily. 1 Inhaler 11  . DARUNAVIR ETHANOLATE 800 MG PO TABS Oral Take 800 mg by mouth daily.    [name] EMTRICITABINE-TENOFOVIR 200-300 MG PO TABS Oral Take 1 tablet by mouth daily.    [name] FOLIC ACID 1 MG PO TABS Oral Take 1 tablet (1 mg total) by mouth daily. 30 tablet 11  . GABAPENTIN 600 MG PO TABS Oral Take 600 mg by mouth 2 (two) times daily.    [name] HYDROCODONE-ACETAMINOPHEN 5-500 MG PO TABS Oral Take 1 tablet by mouth every 8 (eight) hours as needed. For pain    .  HYDROXYZINE PAMOATE 25 MG PO CAPS Oral Take 25 mg by mouth 3 (three) times daily as needed. For itching/One capsule by mouth every 6 hours when necessary    . LORATADINE 10 MG PO TABS Oral Take 1 tablet (10 mg total) by mouth daily. 30 tablet 2  . MUPIROCIN 2 % EX OINT Topical Apply 1 application topically 2 (two) times daily.    [name] OMEPRAZOLE 40 MG PO CPDR Oral Take 40 mg by mouth 2 (two) times daily.    [name] RALTEGRAVIR POTASSIUM 400 MG PO TABS Oral Take 400 mg by mouth 2 (two) times daily.    [name] RITONAVIR 100 MG PO CAPS Oral Take 100 mg by mouth daily. With Prezista and Truvada    . TIZANIDINE HCL 4 MG PO CAPS Oral Take 4 mg by mouth every 6 (six) hours as needed. For muscle spasms    . TRAZODONE HCL 50 MG PO TABS Oral  Take 50 mg by mouth at bedtime.    [name] FLUTICASONE PROPIONATE 50 MCG/ACT NA SUSP Nasal Place 1 spray into the nose daily. 16 g 2  . TRAZODONE HCL 50 MG PO TABS Oral Take 1 tablet (50 mg total) by mouth at bedtime. 30 tablet 6  . ZOLPIDEM TARTRATE ER 12.5 MG PO TBCR Oral Take 12.5 mg by mouth at bedtime as needed. For sleep      BP 109/71  Pulse 84  Temp(Src) 98.2 F (36.8 C) (Oral)  Resp 19  SpO2 100%  Physical Exam  Constitutional: He is oriented to person, place, and time. He appears well-developed and well-nourished. No distress.  HENT:  Head: Normocephalic and atraumatic.  Right Ear: External ear normal.  Left Ear: External ear normal.  Mouth/Throat: Oropharynx is clear and moist. No oropharyngeal exudate.       Frontal maxillary sinus tenderness  Eyes: Conjunctivae and EOM are normal. Pupils are equal, round, and reactive to light.  Neck: Normal range of motion. Neck supple.       No meningismus  Cardiovascular: Normal rate, regular rhythm and normal heart sounds.   No murmur heard. Pulmonary/Chest: Effort normal and breath sounds normal. No respiratory distress. He has no wheezes.  Abdominal: Soft. There is no tenderness. There is no rebound and no guarding.  Musculoskeletal: Normal range of motion. He exhibits no edema and no tenderness.  Neurological: He is alert and oriented to person, place, and time. No cranial nerve deficit.  Skin: Skin is warm.    ED Course  Procedures (including critical care time)  Labs Reviewed  CBC - Abnormal; Notable for the following:    Hemoglobin 12.3 (*)    HCT 36.5 (*)    RDW 17.0 (*)    All other components within normal limits  DIFFERENTIAL - Abnormal; Notable for the following:    Monocytes Relative 14 (*)    All other components within normal limits  COMPREHENSIVE METABOLIC PANEL - Abnormal; Notable for the following:    GFR calc non Af Amer 65 (*)    GFR calc Af Amer 76 (*)    All other components within normal limits    LACTATE DEHYDROGENASE - Abnormal; Notable for the following:    LDH 259 (*) SLIGHT HEMOLYSIS   All other components within normal limits  LACTIC ACID, PLASMA  RAPID STREP SCREEN  CULTURE, BLOOD (ROUTINE X 2)  CULTURE, BLOOD (ROUTINE X 2)  URINALYSIS, ROUTINE W REFLEX MICROSCOPIC   Dg Chest 2 View  11/29/2011  *RADIOLOGY REPORT*  Clinical  Data: 51 year old male with shortness of breath cough and fever.  CHEST - 2 VIEW  Comparison: 09/25/2011 and earlier.  Findings: Normal lung volumes.  Cardiac size and mediastinal contours are within normal limits.  Visualized tracheal air column is within normal limits.  The lungs are clear.  No pneumothorax or pleural effusion. No acute osseous abnormality identified. Incidental right nipple shadow, stable since 2010.  IMPRESSION: No acute cardiopulmonary abnormality.  Original Report Authenticated By: Harley Hallmark, M.D.   Ct Head W Wo Contrast  11/29/2011  *RADIOLOGY REPORT*  Clinical Data: Fever to 102.  Pain and weakness.  Headaches and body aches.  CT HEAD WITHOUT AND WITH CONTRAST  Technique:  Contiguous axial images were obtained from the base of the skull through the vertex without and with intravenous contrast.  Contrast:  80 ml Omnipaque 350.  Comparison: CT head without contrast 04/11/2011.  MRI brain 10/10/2010.  Findings: No acute cortical infarct, hemorrhage, or mass lesion is present.  The ventricles are of normal size.  No significant extra- axial fluid collection is present.  The postcontrast images demonstrate no pathologic enhancement.  The paranasal sinuses and mastoid air cells are clear.  The osseous skull is intact.  IMPRESSION:  1.  Negative CT of the head without and with contrast.  Original Report Authenticated By: Jamesetta Orleans. MATTERN, M.D.   Ct Angio Chest W/cm &/or Wo Cm  11/29/2011  *RADIOLOGY REPORT*  Clinical Data: Fever with shortness of breath and cough.  CT ANGIOGRAPHY CHEST  Technique:  Multidetector CT imaging of the chest  using the standard protocol during bolus administration of intravenous contrast. Multiplanar reconstructed images including MIPs were obtained and reviewed to evaluate the vascular anatomy.  Contrast: OMNIPAQUE IOHEXOL 350 MG/ML SOLN  Comparison: Chest x-ray earlier in the day.  Findings: The initial bolus of contrast was suboptimal for evaluating pulmonary vasculature.  The patient was asked to not Valsalva during breath hold for the repeat injection of contrast but this was not entirely successful either.  Quality of the examination is poor although there is adequate opacification of the central pulmonary vasculature.  No visible central PE.  The cardiac size is normal.    There is no hilar or mediastinal adenopathy.  There are no areas of focal consolidation.  Slight peribronchial stranding bilaterally, particularly in the left lower lobe, could represent viral pneumonitis.  Small granuloma right upper lobe.  Normal arch and great vessels.  No pleural or pericardial effusion. Negative osseous structures.  Images obtained through the upper abdomen reveal a subcentimeter gallstone lodged in the neck of the gallbladder; no visible pericholecystic fluid.  Bilateral renal cysts are seen incompletely evaluated.  Normal appearing liver and spleen.  IMPRESSION: No evidence for pulmonary embolic disease within the central pulmonary vasculature.  See comments above.  Mild peribronchial stranding; question viral pneumonitis.  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  Original Report Authenticated By: Elsie Stain, M.D.     No diagnosis found.    MDM  History of HIV with body aches, fever, chills, hypoxia and shortness of breath. Clear bilaterally. No distress. No meningismus.  Lungs clear bilaterally, no abnormality on chest x-ray. Patient is febrile, tachycardic and hypoxic however.  Suspect viral respiratory process but given the patient's immunocompromised status, will treat with  antibiotics.  He is nontoxic and well-appearing. He has no meningismus or neurological deficit. Follow up with [facility] this week.       Glynn Octave, MD 11/30/11 0005

## 2011-11-29 NOTE — ED Notes (Signed)
Family reports pt was out in the yard on Saturday, Monday began with fever 102, decrease appetite, sluggish, headache, and body aches.

## 2011-11-29 NOTE — Discharge Instructions (Signed)
Cough, Adult Take antibiotics as prescribed. Followup with her Dr. in one day. Return to ED to the worsening symptoms.  A cough is a reflex that helps clear your throat and airways. It can help heal the body or may be a reaction to an irritated airway. A cough may only last 2 or 3 weeks (acute) or may last more than 8 weeks (chronic).  CAUSES Acute cough:  Viral or bacterial infections.  Chronic cough:  Infections.   Allergies.   Asthma.   Post-nasal drip.   Smoking.   Heartburn or acid reflux.   Some medicines.   Chronic lung problems (COPD).   Cancer.  SYMPTOMS   Cough.   Fever.   Chest pain.   Increased breathing rate.   High-pitched whistling sound when breathing (wheezing).   Colored mucus that you cough up (sputum).  TREATMENT   A bacterial cough may be treated with antibiotic medicine.   A viral cough must run its course and will not respond to antibiotics.   Your caregiver may recommend other treatments if you have a chronic cough.  HOME CARE INSTRUCTIONS   Only take over-the-counter or prescription medicines for pain, discomfort, or fever as directed by your caregiver. Use cough suppressants only as directed by your caregiver.   Use a cold steam vaporizer or humidifier in your bedroom or home to help loosen secretions.   Sleep in a semi-upright position if your cough is worse at night.   Rest as needed.   Stop smoking if you smoke.  SEEK IMMEDIATE MEDICAL CARE IF:   You have pus in your sputum.   Your cough starts to worsen.   You cannot control your cough with suppressants and are losing sleep.   You begin coughing up blood.   You have difficulty breathing.   You develop pain which is getting worse or is uncontrolled with medicine.   You have a fever.  MAKE SURE YOU:   Understand these instructions.   Will watch your condition.   Will get help right away if you are not doing well or get worse.  Document Released: 12/16/2010  Document Revised: 06/08/2011 Document Reviewed: 12/16/2010 [facility] Patient Information 2012 [place], [place].

## 2011-12-04 ENCOUNTER — Other Ambulatory Visit: Payer: Self-pay | Admitting: *Deleted

## 2011-12-04 MED ORDER — ZOLPIDEM TARTRATE ER 12.5 MG PO TBCR: 12.5000 mg | EXTENDED_RELEASE_TABLET | Freq: Every evening | ORAL | Status: DC | PRN

## 2011-12-04 NOTE — Telephone Encounter (Signed)
Zolpidem rx called to [facility] pharmacy. 

## 2011-12-06 LAB — CULTURE, BLOOD (ROUTINE X 2)
Culture  Setup Time: 201305300151
Culture  Setup Time: 201305300151
Culture: NO GROWTH
Culture: NO GROWTH

## 2011-12-08 ENCOUNTER — Encounter: Payer: Self-pay | Admitting: Internal Medicine

## 2011-12-08 ENCOUNTER — Ambulatory Visit (INDEPENDENT_AMBULATORY_CARE_PROVIDER_SITE_OTHER): Payer: Medicaid Other | Admitting: Internal Medicine

## 2011-12-08 ENCOUNTER — Ambulatory Visit: Payer: Medicaid Other

## 2011-12-08 VITALS — BP 133/93 | HR 88 | Temp 98.4°F | Ht 69.0 in | Wt 222.9 lb

## 2011-12-08 DIAGNOSIS — R059 Cough, unspecified: Secondary | ICD-10-CM

## 2011-12-08 DIAGNOSIS — R51 Headache: Secondary | ICD-10-CM

## 2011-12-08 DIAGNOSIS — Z21 Asymptomatic human immunodeficiency virus [HIV] infection status: Secondary | ICD-10-CM

## 2011-12-08 DIAGNOSIS — D649 Anemia, unspecified: Secondary | ICD-10-CM

## 2011-12-08 DIAGNOSIS — R509 Fever, unspecified: Secondary | ICD-10-CM

## 2011-12-08 DIAGNOSIS — F411 Generalized anxiety disorder: Secondary | ICD-10-CM

## 2011-12-08 DIAGNOSIS — B20 Human immunodeficiency virus [HIV] disease: Secondary | ICD-10-CM

## 2011-12-08 DIAGNOSIS — N289 Disorder of kidney and ureter, unspecified: Secondary | ICD-10-CM

## 2011-12-08 DIAGNOSIS — F419 Anxiety disorder, unspecified: Secondary | ICD-10-CM

## 2011-12-08 DIAGNOSIS — K8020 Calculus of gallbladder without cholecystitis without obstruction: Secondary | ICD-10-CM

## 2011-12-08 DIAGNOSIS — R05 Cough: Secondary | ICD-10-CM

## 2011-12-08 MED ORDER — TRAZODONE HCL 50 MG PO TABS: 50.0000 mg | ORAL_TABLET | Freq: Every day | ORAL | Status: DC

## 2011-12-08 MED ORDER — CYANOCOBALAMIN 1000 MCG/ML IJ SOLN
1000.0000 ug | Freq: Once | INTRAMUSCULAR | Status: AC
  Administered 2011-12-08: 1000 ug via INTRAMUSCULAR

## 2011-12-08 MED ORDER — ALBUTEROL SULFATE (2.5 MG/3ML) 0.083% IN NEBU: 2.5000 mg | INHALATION_SOLUTION | Freq: Four times a day (QID) | RESPIRATORY_TRACT | Status: DC | PRN

## 2011-12-08 NOTE — Assessment & Plan Note (Addendum)
I believe that patient's sister is very anxious and contributing to over treatment of the Casey Brennan. A significant amount of time (>15 minutes) was spent in explaining that CT scan of the chest is a very sensitive test to detect infectious pathology from the chest and if it was normal, it is highly unlikely to have something which is life threatening at this time.  It was also emphasized that they should try and minimize ER visits and prefer clinic visits over ER since we know the patient better.

## 2011-12-08 NOTE — Assessment & Plan Note (Signed)
Patient has had headaches for a long time, nonspecific. CT Head have been negative most recent on 11/29/2011.   Although patient was requesting narcotic pain medications for his headache , I believe that patient should take Tylenol and ibuprofen first as narcotics are not without side effects. He already endorses some benefit from Tylenol. Patient was educated that ibuprofen in small amounts should not effect kidney function.   Patient also asked for vitamin B12 shot. B12 shot was given last month by Dr. Arvilla Market. She noted that there is no benefit from vitamin B12 in literature but since patient thinks it will be helpful, there is no harm of giving it to him at this time.

## 2011-12-08 NOTE — Assessment & Plan Note (Signed)
Followed by [facility]. Got an infusion IV iron 2 weeks ago.

## 2011-12-08 NOTE — Progress Notes (Signed)
  Subjective:    Patient ID: Casey Brennan, male    DOB: 04-01-1961, 51 y.o.   MRN: [id number]  HPI  Patient is here today for a follow up of his ER visit.  He went to ER on may 30th with cough with sputum, headaches, shortness of breath, fever and chills. Patient had CT head which was negative. CTA of chest which was negative for PE or PNA, CXR 2 view which was negative for acute chest pathology.   Cough and sputum: persistent since last 2 weeks, was worse as described above which made him go to ER on 5/30 where he had above tests and z pack was prescribed. He completed the z pack. The fever and chills have completely resolved but he still has cough with sputum. Yellow colored sputum. Cough suppressants are not helping.  CKD: He also had low iron levels and had iron infusion prescribed by Dr. Zetta Bills. Next appointment with nephrology on Aug 1st.  Abdominal pain adn shoulder pain: Dr Bosie Clos has scheduled a colonoscopy and EGD on June 25th to follow up on the gastric ulcer and biopsy of colon during screening colonoscopy. He does have cholelithiasis and has seen Dr. Dwain Sarna in the Jan 31st 2013 where he mentioned that he would wanna take the GB out although he is waiting for clearance from Dr. Bosie Clos.  Patient wants a vitamin B12 shot.  No other complaints today.    Review of Systems  Constitutional: Negative for fever, activity change and appetite change.  HENT: Negative for sore throat and trouble swallowing.   Respiratory: Positive for cough and wheezing. Negative for shortness of breath.   Cardiovascular: Negative for chest pain and leg swelling.  Gastrointestinal: Negative for nausea, abdominal pain, diarrhea, constipation and abdominal distention.  Genitourinary: Negative for frequency, hematuria and difficulty urinating.  Neurological: Negative for dizziness and headaches.  Psychiatric/Behavioral: Negative for suicidal ideas and behavioral problems.       Objective:     Physical Exam  Constitutional: He is oriented to person, place, and time. He appears well-developed and well-nourished.  HENT:  Head: Normocephalic and atraumatic.  Eyes: Conjunctivae and EOM are normal. Pupils are equal, round, and reactive to light. No scleral icterus.  Neck: Normal range of motion. Neck supple. No JVD present. No thyromegaly present.  Cardiovascular: Normal rate, regular rhythm, normal heart sounds and intact distal pulses.  Exam reveals no gallop and no friction rub.   No murmur heard. Pulmonary/Chest: Effort normal and breath sounds normal. No respiratory distress. He has no wheezes. He has no rales.  Abdominal: Soft. Bowel sounds are normal. He exhibits no distension and no mass. There is no tenderness. There is no rebound and no guarding.  Musculoskeletal: Normal range of motion. He exhibits no edema and no tenderness.  Lymphadenopathy:    He has no cervical adenopathy.  Neurological: He is alert and oriented to person, place, and time.  Psychiatric: He has a normal mood and affect. His behavior is normal.          Assessment & Plan:

## 2011-12-08 NOTE — Patient Instructions (Signed)
Cough syrup over the counter tussinex dm by equate is cheap and effective. Tylenol and ibuprofen for headaches. Do not take NSAID's like ibuprofen or aleve in high doses regularly. Do not take >4 tablets of tylenol in a day. Follow up in 2-3 months. Steam inhalation 2-3 time s a day.

## 2011-12-08 NOTE — Assessment & Plan Note (Signed)
I will change the diagnosis to symptomatic cholelithiaasis

## 2011-12-08 NOTE — Assessment & Plan Note (Signed)
Followed by Dr. Kathe Mariner from [facility].

## 2011-12-08 NOTE — Assessment & Plan Note (Signed)
I would argue that patient is having asymptomatic cholelithiasis at this time. It is constantly complaining of abdominal pain which is worse with eating. Is also endorsing shoulder pain in last 2-3 months. His last visit with Dr. Dwain Sarna 5 months ago. I would forward my note to Dr. Dwain Sarna to assess him for elective cholecystectomy in view of his continued symptoms. Patient is scheduled for colonoscopy and EGD later this month Dr. Bosie Clos.

## 2011-12-14 ENCOUNTER — Other Ambulatory Visit: Payer: Self-pay | Admitting: Licensed Clinical Social Worker

## 2011-12-14 DIAGNOSIS — L299 Pruritus, unspecified: Secondary | ICD-10-CM

## 2011-12-14 MED ORDER — HYDROXYZINE PAMOATE 25 MG PO CAPS: 25.0000 mg | ORAL_CAPSULE | Freq: Three times a day (TID) | ORAL | Status: DC | PRN

## 2011-12-18 ENCOUNTER — Other Ambulatory Visit: Payer: Self-pay | Admitting: *Deleted

## 2011-12-18 DIAGNOSIS — L299 Pruritus, unspecified: Secondary | ICD-10-CM

## 2011-12-18 MED ORDER — HYDROXYZINE PAMOATE 25 MG PO CAPS: 25.0000 mg | ORAL_CAPSULE | Freq: Three times a day (TID) | ORAL | Status: DC | PRN

## 2011-12-25 ENCOUNTER — Encounter (HOSPITAL_COMMUNITY): Payer: Self-pay | Admitting: *Deleted

## 2011-12-26 ENCOUNTER — Encounter (HOSPITAL_COMMUNITY): Payer: Self-pay | Admitting: Anesthesiology

## 2011-12-26 ENCOUNTER — Ambulatory Visit (HOSPITAL_COMMUNITY)
Admission: RE | Admit: 2011-12-26 | Discharge: 2011-12-26 | Disposition: A | Discharge status: Home / Self Care | Payer: Medicaid Other | Source: Ambulatory Visit | Attending: Gastroenterology | Admitting: Gastroenterology

## 2011-12-26 ENCOUNTER — Encounter (HOSPITAL_COMMUNITY)
Admission: RE | Disposition: A | Discharge status: Home / Self Care | Payer: Self-pay | Source: Ambulatory Visit | Attending: Gastroenterology

## 2011-12-26 ENCOUNTER — Ambulatory Visit (HOSPITAL_COMMUNITY): Payer: Medicaid Other | Admitting: Anesthesiology

## 2011-12-26 ENCOUNTER — Encounter (HOSPITAL_COMMUNITY): Payer: Self-pay | Admitting: *Deleted

## 2011-12-26 DIAGNOSIS — Z8711 Personal history of peptic ulcer disease: Secondary | ICD-10-CM | POA: Insufficient documentation

## 2011-12-26 DIAGNOSIS — K648 Other hemorrhoids: Secondary | ICD-10-CM | POA: Insufficient documentation

## 2011-12-26 DIAGNOSIS — Z21 Asymptomatic human immunodeficiency virus [HIV] infection status: Secondary | ICD-10-CM | POA: Insufficient documentation

## 2011-12-26 DIAGNOSIS — K3189 Other diseases of stomach and duodenum: Secondary | ICD-10-CM | POA: Insufficient documentation

## 2011-12-26 DIAGNOSIS — R198 Other specified symptoms and signs involving the digestive system and abdomen: Secondary | ICD-10-CM | POA: Insufficient documentation

## 2011-12-26 DIAGNOSIS — R1013 Epigastric pain: Secondary | ICD-10-CM | POA: Insufficient documentation

## 2011-12-26 DIAGNOSIS — Z79899 Other long term (current) drug therapy: Secondary | ICD-10-CM | POA: Insufficient documentation

## 2011-12-26 DIAGNOSIS — I10 Essential (primary) hypertension: Secondary | ICD-10-CM | POA: Insufficient documentation

## 2011-12-26 DIAGNOSIS — J45909 Unspecified asthma, uncomplicated: Secondary | ICD-10-CM | POA: Insufficient documentation

## 2011-12-26 HISTORY — PX: COLONOSCOPY: SHX5424

## 2011-12-26 HISTORY — PX: ESOPHAGOGASTRODUODENOSCOPY: SHX5428

## 2011-12-26 SURGERY — COLONOSCOPY
Anesthesia: Monitor Anesthesia Care

## 2011-12-26 MED ORDER — FENTANYL CITRATE 0.05 MG/ML IJ SOLN
INTRAMUSCULAR | Status: DC | PRN
  Administered 2011-12-26: 50 ug via INTRAVENOUS
  Administered 2011-12-26: 25 ug via INTRAVENOUS

## 2011-12-26 MED ORDER — MIDAZOLAM HCL 5 MG/5ML IJ SOLN
INTRAMUSCULAR | Status: DC | PRN
  Administered 2011-12-26: 2 mg via INTRAVENOUS

## 2011-12-26 MED ORDER — LIDOCAINE HCL (CARDIAC) 20 MG/ML IV SOLN
INTRAVENOUS | Status: DC | PRN
  Administered 2011-12-26: 80 mg via INTRAVENOUS

## 2011-12-26 MED ORDER — LACTATED RINGERS IV SOLN: INTRAVENOUS | Status: DC

## 2011-12-26 MED ORDER — PROPOFOL 10 MG/ML IV EMUL
INTRAVENOUS | Status: DC | PRN
  Administered 2011-12-26: 100 ug/kg/min via INTRAVENOUS

## 2011-12-26 MED ORDER — KETAMINE HCL 10 MG/ML IJ SOLN
INTRAMUSCULAR | Status: DC | PRN
  Administered 2011-12-26: 30 mg via INTRAVENOUS

## 2011-12-26 MED ORDER — LACTATED RINGERS IV SOLN
INTRAVENOUS | Status: DC | PRN
  Administered 2011-12-26: 11:00:00 via INTRAVENOUS

## 2011-12-26 NOTE — Discharge Instructions (Addendum)
Repeat colonoscopy in 5 years. Change Omeprazole once a day by mouth.

## 2011-12-26 NOTE — Interval H&P Note (Signed)
History and Physical Interval Note:  12/26/2011 11:28 AM  Casey Brennan  has presented today for surgery, with the diagnosis of screening for colon polyps/CA follow up gastric ulcers  The various methods of treatment have been discussed with the patient and family. After consideration of risks, benefits and other options for treatment, the patient has consented to  Procedure(s) (LRB): COLONOSCOPY (N/A) ESOPHAGOGASTRODUODENOSCOPY (EGD) (N/A) as a surgical intervention .  The patient's history has been reviewed, patient examined, no change in status, stable for surgery.  I have reviewed the patients' chart and labs.  Questions were answered to the patient's satisfaction.     Dawnn Nam C.

## 2011-12-26 NOTE — Preoperative (Signed)
Beta Blockers   Reason not to administer Beta Blockers:Not Applicable 

## 2011-12-26 NOTE — Op Note (Signed)
[facility] [address] [place], [place]  [address]  ENDOSCOPY PROCEDURE REPORT  PATIENT:  Casey Brennan, Casey Brennan  MR#:  [id number] BIRTHDATE:  07-29-1960, 50 yrs. old  GENDER:  male  ENDOSCOPIST:  Charlott Rakes, MD Referred by:  Emelia Loron, M.D.  PROCEDURE DATE:  12/26/2011 PROCEDURE:  EGD, diagnostic 43235 ASA CLASS:  Class III INDICATIONS:  history of gastric ulcers, dyspepsia  MEDICATIONS:   See Anesthesia Report.  TOPICAL ANESTHETIC:  DESCRIPTION OF PROCEDURE:   After the risks benefits and alternatives of the procedure were thoroughly explained, informed consent was obtained.  The Pentax Gastroscope [id number] endoscope was introduced through the mouth and advanced to the second portion of the duodenum, without limitations.  The instrument was slowly withdrawn as the mucosa was fully examined. <<PROCEDUREIMAGES>>  FINDINGS:  The endoscope was inserted into the oropharynx and esophagus was intubated.  The gastroesophageal junction was normal in appearance with a regular appearing Z-line. Endoscope was advanced into the stomach, which revealed normal appearing gastric mucosa. The antral was normal in appearance.  The endoscope was advanced to the duodenal bulb and second portion of duodenum which were unremarkable.  The endoscope was withdrawn back into the stomach and retroflexion revealed a normal proximal stomach.  COMPLICATIONS:  None  ENDOSCOPIC IMPRESSION:  Normal EGD (previous antral ulcers have completely healed)  RECOMMENDATIONS:  Change PPI to QD from BID Avoid NSAIDs  REPEAT EXAM:  N/A  ______________________________ Charlott Rakes, MD  CC:  Emelia Loron, MD  n. Rosalie DoctorCharlott Rakes at 12/26/2011 12:06 PM  Sharyn Dross, [phone]

## 2011-12-26 NOTE — Op Note (Signed)
[facility] [address] [place], [place]  [address]  COLONOSCOPY PROCEDURE REPORT  PATIENT:  Casey, Brennan  MR#:  [id number] BIRTHDATE:  1960-07-17, 50 yrs. old  GENDER:  male ENDOSCOPIST:  Charlott Rakes, MD REF. BY:  Emelia Loron, M.D. PROCEDURE DATE:  12/26/2011 PROCEDURE:  Colonoscopy 19147 ASA CLASS:  Class III INDICATIONS:  change in bowel habits, Screening MEDICATIONS:   See Anesthesia Report.  DESCRIPTION OF PROCEDURE:   After the risks benefits and alternatives of the procedure were thoroughly explained, informed consent was obtained.  The Pentax Peds Colonoscope [id number] and [id number] [id number]) endoscope was introduced through the anus and advanced to the cecum, which was identified by both the appendix and ileocecal valve, without limitations.  The quality of the prep was good except the cecum and proximal ascending colon prep was fair.  The instrument was then slowly withdrawn as the colon was fully examined. <<PROCEDUREIMAGES>>  FINDINGS:  Rectal exam unremarkable.  Pediatric colonoscope inserted into the colon and advanced to the cecum, where the appendiceal orifice and ileocecal valve were identified.    In the cecum was liquid stool that could not be completely irrigated away. No polyps were noted on careful evaluation of the colon. On careful withdrawal of the colonoscope no mucosal abnormalities were seen.    Retroflexion revealed medium-sized internal hemorrhoids.  COMPLICATIONS:  None  IMPRESSION:   1. Internal hemorrhoids 2. Fair prep in cecum and proximal ascending colon  RECOMMENDATIONS:    1. Repeat colonoscopy in 5 years  ______________________________ Charlott Rakes, MD  CC:  Emelia Loron, MD  n. Rosalie DoctorCharlott Rakes at 12/26/2011 12:20 PM  Sharyn Dross, [phone]

## 2011-12-26 NOTE — Anesthesia Preprocedure Evaluation (Addendum)
Anesthesia Evaluation  Patient identified by MRN, date of birth, ID band Patient awake    Reviewed: Allergy & Precautions, H&P , NPO status , Patient's Chart, lab work & pertinent test results  History of Anesthesia Complications (+) DIFFICULT AIRWAY  Airway Mallampati: II TM Distance: >3 FB Neck ROM: Full    Dental  (+) Edentulous Upper, Edentulous Lower, Upper Dentures and Lower Dentures   Pulmonary asthma , pneumonia ,  breath sounds clear to auscultation  Pulmonary exam normal       Cardiovascular hypertension, Rhythm:Regular Rate:Normal     Neuro/Psych  Headaches, PSYCHIATRIC DISORDERS negative psych ROS   GI/Hepatic negative GI ROS, Neg liver ROS, PUD,   Endo/Other  Morbid obesity  Renal/GU Renal InsufficiencyRenal disease  negative genitourinary   Musculoskeletal negative musculoskeletal ROS (+)   Abdominal   Peds negative pediatric ROS (+)  Hematology  (+) HIV,   Anesthesia Other Findings   Reproductive/Obstetrics negative OB ROS                         Anesthesia Physical Anesthesia Plan  ASA: III  Anesthesia Plan: MAC   Post-op Pain Management:    Induction: Intravenous  Airway Management Planned: Nasal Cannula  Additional Equipment:   Intra-op Plan:   Post-operative Plan:   Informed Consent: I have reviewed the patients History and Physical, chart, labs and discussed the procedure including the risks, benefits and alternatives for the proposed anesthesia with the patient or authorized representative who has indicated his/her understanding and acceptance.   Dental advisory given  Plan Discussed with: CRNA  Anesthesia Plan Comments:         Anesthesia Quick Evaluation

## 2011-12-26 NOTE — H&P (Signed)
Casey Brennan is an 51 y.o. male.   Chief Complaint: Gastric ulcer; Screening HPI: 50yo BM with HIV who had an EGD in 2/13 that showed small gastric ulcers (negative for H. Pylori on gastric biopsies) who is undergoing an EGD to look for healing of these gastric antral ulcers. Casey Brennan also has been having recurrent bloating of unclear etiology. Indigestion controlled with PPI. No history of rectal bleeding or change in bowels.  Past Medical History  Diagnosis Date  . HIV (human immunodeficiency virus infection)   . Pneumonia 07/12/10    SEPTIC SHOCK  . Severe frontal headaches 08/16/10  . Chest pain 07/12/10    pt states no hx of chest pain - 12/25/11  . Cyst     left nipple  . Asthma   . Hypertension   . HIV (human immunodeficiency virus infection)   . Gastric ulcer     Past Surgical History  Procedure Date  . Upper leg soft tissue biopsy     left thigh  . Knee arthroscopy     right  . Acne cyst removal   . Breast surgery     left retroareolar abscess i and d, biopsy    Prescriptions prior to admission  Medication Sig Dispense Refill  . albuterol (PROVENTIL HFA;VENTOLIN HFA) 108 (90 BASE) MCG/ACT inhaler Inhale 2 puffs into the lungs every 4 (four) hours as needed for wheezing.  1 each  0  . budesonide-formoterol (SYMBICORT) 80-4.5 MCG/ACT inhaler Inhale 2 puffs into the lungs 2 (two) times daily.  1 Inhaler  11  . Darunavir Ethanolate (PREZISTA) 800 MG tablet Take 800 mg by mouth daily.      [name] emtricitabine-tenofovir (TRUVADA) 200-300 MG per tablet Take 1 tablet by mouth daily.      . fluticasone (FLONASE) 50 MCG/ACT nasal spray Place 1 spray into the nose daily.  16 g  2  . folic acid (FOLVITE) 1 MG tablet Take 1 tablet (1 mg total) by mouth daily.  30 tablet  11  . gabapentin (NEURONTIN) 600 MG tablet Take 600 mg by mouth 2 (two) times daily.      . hydrOXYzine (VISTARIL) 25 MG capsule Take 1 capsule (25 mg total) by mouth 3 (three) times daily as needed. For  itching/One capsule by mouth every 6 hours when necessary  30 capsule  1  . mupirocin ointment (BACTROBAN) 2 % Apply 1 application topically 2 (two) times daily.      [name] omeprazole (PRILOSEC) 40 MG capsule Take 40 mg by mouth 2 (two) times daily.      . raltegravir (ISENTRESS) 400 MG tablet Take 400 mg by mouth 2 (two) times daily.      . ritonavir (NORVIR) 100 MG capsule Take 100 mg by mouth daily. With Prezista and Truvada      . tiZANidine (ZANAFLEX) 4 MG capsule Take 4 mg by mouth every 6 (six) hours as needed. For muscle spasms      . traZODone (DESYREL) 50 MG tablet Take 1 tablet (50 mg total) by mouth at bedtime.  30 tablet  2  . albuterol (PROVENTIL) (2.5 MG/3ML) 0.083% nebulizer solution Take 3 mLs (2.5 mg total) by nebulization every 6 (six) hours as needed for wheezing.  75 mL  3    Allergies:  Allergies  Allergen Reactions  . Penicillins Shortness Of Breath and Rash  . Sulfonamide Derivatives Shortness Of Breath and Rash    Family History  Problem Relation Age of Onset  .  Stroke Mother   . Cancer Mother   . Stroke Father   . Diabetes Father   . Hypertension Sister     Social History:  reports that he quit smoking about 19 months ago. His smoking use included Cigars. He has never used smokeless tobacco. He reports that he does not drink alcohol or use illicit drugs.   ROS: Negative from GI standpoint except as stated above   No results found for this or any previous visit (from the past 48 hour(s)). No results found.  VITALS: Filed Vitals:   12/26/11 1037  BP: 129/80  Pulse: 76  Temp:   Resp: 18    PHYSICAL EXAM: General appearance: alert, well-nourished, no acute distress Resp: clear to ascultation bilaterally Cardio: regular, rate, and rhythm no murmurs, rubs, or gallops GI: soft, nontender, nondistended, positive bowel sounds  Assessment/Plan 50yo with history of gastric ulcers found in February who has been on PPI BID since then up until May. Will do a  surveillance EGD to verify healing and also will do a screening colonoscopy.  Loralie Malta C. 12/26/2011, 11:18 AM

## 2011-12-26 NOTE — Transfer of Care (Signed)
Immediate Anesthesia Transfer of Care Note  Patient: Casey Brennan  Procedure(s) Performed: Procedure(s) (LRB): COLONOSCOPY (N/A) ESOPHAGOGASTRODUODENOSCOPY (EGD) (N/A)  Patient Location: PACU  Anesthesia Type: MAC  Level of Consciousness: awake, alert , oriented and patient cooperative  Airway & Oxygen Therapy: Patient Spontanous Breathing and Patient connected to face mask oxygen  Post-op Assessment: Report given to PACU RN, Post -op Vital signs reviewed and stable and Patient moving all extremities  Post vital signs: Reviewed and stable  Complications: No apparent anesthesia complications

## 2011-12-27 ENCOUNTER — Encounter (HOSPITAL_COMMUNITY): Payer: Self-pay | Admitting: Gastroenterology

## 2011-12-27 NOTE — Anesthesia Postprocedure Evaluation (Signed)
Anesthesia Post Note  Patient: Casey Brennan  Procedure(s) Performed: Procedure(s) (LRB): COLONOSCOPY (N/A) ESOPHAGOGASTRODUODENOSCOPY (EGD) (N/A)  Anesthesia type: General  Patient location: PACU  Post pain: Pain level controlled  Post assessment: Post-op Vital signs reviewed  Last Vitals:  Filed Vitals:   12/26/11 1240  BP: 135/85  Pulse:   Temp:   Resp: 13    Post vital signs: Reviewed  Level of consciousness: sedated  Complications: No apparent anesthesia complications

## 2012-01-15 ENCOUNTER — Other Ambulatory Visit: Payer: Self-pay | Admitting: *Deleted

## 2012-01-15 DIAGNOSIS — B20 Human immunodeficiency virus [HIV] disease: Secondary | ICD-10-CM

## 2012-01-15 MED ORDER — DARUNAVIR ETHANOLATE 800 MG PO TABS: 800.0000 mg | ORAL_TABLET | Freq: Every day | ORAL | Status: DC

## 2012-01-15 MED ORDER — EMTRICITABINE-TENOFOVIR DF 200-300 MG PO TABS: 1.0000 | ORAL_TABLET | Freq: Every day | ORAL | Status: DC

## 2012-01-15 MED ORDER — RITONAVIR 100 MG PO CAPS: 100.0000 mg | ORAL_CAPSULE | Freq: Every day | ORAL | Status: DC

## 2012-01-15 MED ORDER — RALTEGRAVIR POTASSIUM 400 MG PO TABS: 400.0000 mg | ORAL_TABLET | Freq: Two times a day (BID) | ORAL | Status: DC

## 2012-01-19 ENCOUNTER — Other Ambulatory Visit: Payer: Medicaid Other

## 2012-01-19 ENCOUNTER — Encounter (INDEPENDENT_AMBULATORY_CARE_PROVIDER_SITE_OTHER): Payer: Self-pay

## 2012-01-19 ENCOUNTER — Ambulatory Visit (INDEPENDENT_AMBULATORY_CARE_PROVIDER_SITE_OTHER): Payer: Medicaid Other | Admitting: *Deleted

## 2012-01-19 ENCOUNTER — Encounter: Payer: Self-pay | Admitting: *Deleted

## 2012-01-19 DIAGNOSIS — B20 Human immunodeficiency virus [HIV] disease: Secondary | ICD-10-CM

## 2012-01-19 DIAGNOSIS — D649 Anemia, unspecified: Secondary | ICD-10-CM

## 2012-01-19 LAB — CBC
HCT: 44.4 % (ref 39.0–52.0)
Hemoglobin: 14.8 g/dL (ref 13.0–17.0)
MCH: 30.6 pg (ref 26.0–34.0)
MCHC: 33.3 g/dL (ref 30.0–36.0)
MCV: 91.9 fL (ref 78.0–100.0)
Platelets: 189 10*3/uL (ref 150–400)
RBC: 4.83 MIL/uL (ref 4.22–5.81)
RDW: 14.7 % (ref 11.5–15.5)
WBC: 4.6 10*3/uL (ref 4.0–10.5)

## 2012-01-19 LAB — T-HELPER CELL (CD4) - (RCID CLINIC ONLY)
CD4 % Helper T Cell: 14 % — ABNORMAL LOW (ref 33–55)
CD4 T Cell Abs: 330 uL — ABNORMAL LOW (ref 400–2700)

## 2012-01-19 MED ORDER — CYANOCOBALAMIN 1000 MCG/ML IJ SOLN
1000.0000 ug | Freq: Once | INTRAMUSCULAR | Status: AC
  Administered 2012-01-19: 1000 ug via INTRAMUSCULAR

## 2012-01-19 NOTE — Progress Notes (Signed)
Patient ID: Casey Brennan, male   DOB: [birth year], 51 y.o.   MRN: [id number] Verbal order given for Cyanocobalamin  1,02mcg/ml IM per Dr Kem Kays. Pt instructed to discuss further need of med with his PCP.

## 2012-01-19 NOTE — Progress Notes (Signed)
Agree. If HA worsens, is associated with vision changes, dizziness or nausea may go to ED or urgent care.

## 2012-01-19 NOTE — Progress Notes (Unsigned)
Pt presents c/o h/a, this ongoing for over a year, states no worse than it has been and admits this h/a started yesterday and was the first in quite awhile. Denies visual changes and states it is 6/10 scale. C/o right shoulder starting to hurt also, denies injuries to the shoulder. Using tylenol, drinking water qs. Denies N&V. Advised to rest this weekend, use tylenol as directed at last visit. To please go to Ed or urg care if he becomes worse or feels that he cannot wait. appt tues 7/23 at 1045 dr Clyde Lundborg. appt card given appt per sharonb.

## 2012-01-20 LAB — COMPLETE METABOLIC PANEL WITH GFR
ALT: 11 U/L (ref 0–53)
AST: 14 U/L (ref 0–37)
Albumin: 4.4 g/dL (ref 3.5–5.2)
Alkaline Phosphatase: 108 U/L (ref 39–117)
BUN: 8 mg/dL (ref 6–23)
CO2: 27 mEq/L (ref 19–32)
Calcium: 9.7 mg/dL (ref 8.4–10.5)
Chloride: 105 mEq/L (ref 96–112)
Creat: 1.28 mg/dL (ref 0.50–1.35)
GFR, Est African American: 76 mL/min
GFR, Est Non African American: 66 mL/min
Glucose, Bld: 69 mg/dL — ABNORMAL LOW (ref 70–99)
Potassium: 4.2 mEq/L (ref 3.5–5.3)
Sodium: 140 mEq/L (ref 135–145)
Total Bilirubin: 0.4 mg/dL (ref 0.3–1.2)
Total Protein: 7.8 g/dL (ref 6.0–8.3)

## 2012-01-22 ENCOUNTER — Ambulatory Visit: Payer: Medicaid Other | Admitting: Internal Medicine

## 2012-01-22 ENCOUNTER — Other Ambulatory Visit: Payer: Medicaid Other

## 2012-01-22 LAB — HIV-1 RNA QUANT-NO REFLEX-BLD
HIV 1 RNA Quant: 40 copies/mL — ABNORMAL HIGH (ref <20)
HIV-1 RNA Quant, Log: 1.6 {Log} — ABNORMAL HIGH (ref <1.30)

## 2012-01-23 ENCOUNTER — Encounter: Payer: Self-pay | Admitting: Internal Medicine

## 2012-01-23 ENCOUNTER — Ambulatory Visit (INDEPENDENT_AMBULATORY_CARE_PROVIDER_SITE_OTHER): Payer: Medicaid Other | Admitting: Internal Medicine

## 2012-01-23 VITALS — BP 126/87 | HR 72 | Temp 97.8°F | Ht 69.0 in | Wt 214.7 lb

## 2012-01-23 DIAGNOSIS — J45909 Unspecified asthma, uncomplicated: Secondary | ICD-10-CM | POA: Insufficient documentation

## 2012-01-23 DIAGNOSIS — M25519 Pain in unspecified shoulder: Secondary | ICD-10-CM

## 2012-01-23 DIAGNOSIS — M25511 Pain in right shoulder: Secondary | ICD-10-CM

## 2012-01-23 DIAGNOSIS — G8929 Other chronic pain: Secondary | ICD-10-CM

## 2012-01-23 DIAGNOSIS — R51 Headache: Secondary | ICD-10-CM

## 2012-01-23 MED ORDER — MELOXICAM 7.5 MG PO TABS: 7.5000 mg | ORAL_TABLET | Freq: Every day | ORAL | Status: DC

## 2012-01-23 NOTE — Assessment & Plan Note (Signed)
His headache is chronic. There is no acute exacerbation. His neuro examination is nonfocal. He has an appointment with his neurologist tomorrow. We'll followup the

## 2012-01-23 NOTE — Patient Instructions (Signed)
1. Please start taking mobic from now. 2. Please take all medications as prescribed.  3. If you have worsening of your symptoms or new symptoms arise, please call the clinic ([phone]), or go to the ER immediately if symptoms are severe.

## 2012-01-23 NOTE — Assessment & Plan Note (Signed)
It is stable. There is no signs of acute exacerbation. Patient doesn't have shortness of breath , cough or chest pain. His lung auscultation is clear bilaterally. We'll continue current regimen.

## 2012-01-23 NOTE — Assessment & Plan Note (Signed)
His right shoulder pain is most likely due to chronic inflammation, such as tendinitis and capsulitis. Patient denies any injury to his right shoulder. Currently there is no signs of infection. Patient tried tylenol and ibuprofen at home without significant help.   -will treat empirically with Mobic.  -will get x-ray to evaluate rotator cuff.
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
## 2012-01-26 ENCOUNTER — Ambulatory Visit: Payer: Medicaid Other | Admitting: Sports Medicine

## 2012-01-31 ENCOUNTER — Ambulatory Visit: Payer: Medicaid Other | Admitting: Sports Medicine

## 2012-02-05 ENCOUNTER — Ambulatory Visit: Payer: Medicaid Other | Admitting: Infectious Diseases

## 2012-02-07 ENCOUNTER — Encounter: Payer: Self-pay | Admitting: Infectious Diseases

## 2012-02-07 ENCOUNTER — Ambulatory Visit (INDEPENDENT_AMBULATORY_CARE_PROVIDER_SITE_OTHER): Payer: Medicaid Other | Admitting: Sports Medicine

## 2012-02-07 ENCOUNTER — Ambulatory Visit (INDEPENDENT_AMBULATORY_CARE_PROVIDER_SITE_OTHER): Payer: Medicaid Other | Admitting: Infectious Diseases

## 2012-02-07 ENCOUNTER — Encounter: Payer: Self-pay | Admitting: Sports Medicine

## 2012-02-07 VITALS — BP 148/97 | HR 80 | Temp 98.0°F | Ht 69.0 in | Wt 212.0 lb

## 2012-02-07 VITALS — BP 132/88 | HR 73 | Ht 69.0 in | Wt 210.0 lb

## 2012-02-07 DIAGNOSIS — Z79899 Other long term (current) drug therapy: Secondary | ICD-10-CM

## 2012-02-07 DIAGNOSIS — M75101 Unspecified rotator cuff tear or rupture of right shoulder, not specified as traumatic: Secondary | ICD-10-CM | POA: Insufficient documentation

## 2012-02-07 DIAGNOSIS — S91109A Unspecified open wound of unspecified toe(s) without damage to nail, initial encounter: Secondary | ICD-10-CM

## 2012-02-07 DIAGNOSIS — M719 Bursopathy, unspecified: Secondary | ICD-10-CM

## 2012-02-07 DIAGNOSIS — N289 Disorder of kidney and ureter, unspecified: Secondary | ICD-10-CM

## 2012-02-07 DIAGNOSIS — B20 Human immunodeficiency virus [HIV] disease: Secondary | ICD-10-CM

## 2012-02-07 DIAGNOSIS — M67919 Unspecified disorder of synovium and tendon, unspecified shoulder: Secondary | ICD-10-CM

## 2012-02-07 DIAGNOSIS — Z113 Encounter for screening for infections with a predominantly sexual mode of transmission: Secondary | ICD-10-CM

## 2012-02-07 MED ORDER — DICLOFENAC SODIUM 1 % TD GEL: 1.0000 "application " | Freq: Two times a day (BID) | TRANSDERMAL | Status: DC

## 2012-02-07 MED ORDER — ZOLPIDEM TARTRATE ER 12.5 MG PO TBCR: 12.5000 mg | EXTENDED_RELEASE_TABLET | Freq: Every evening | ORAL | Status: DC | PRN

## 2012-02-07 NOTE — Assessment & Plan Note (Signed)
Small wounds as noted in [facility] dressed these with vasoline gauze, will send him to podiatry.

## 2012-02-07 NOTE — Assessment & Plan Note (Addendum)
Patient clinically has an ultrasound corresponds with a rotator cuff syndrome. Patient has a potential tear in the supraspinatus that could be giving him this problem. This is still mild and did not feel any other imaging is necessary at this time. Patient given some Voltaren gel to try twice daily for the next 2 weeks and then as needed. Patient given home exercise program and encouraged to do a daily basis. Patient given red flags and when to seek medical attention. Patient will followup in 4 weeks' time for further evaluation. If he continues to have pain at that time would consider to adding a nitroglycerin patch and potentially starting formal physical therapy. If any worsening of pain or any numbness tingling or weakness of the arms and we'll consider further imaging studies.

## 2012-02-07 NOTE — Assessment & Plan Note (Signed)
Doing well, stable. Has renal f/u today.
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
## 2012-02-07 NOTE — Patient Instructions (Signed)
Nice to meet you.  Try the gel on your shoulder twice a day for the next 2 weeks then as needed.  Do the exercises 2 times daily or at least most days of the week.  We will see you back in 4 weeks and see how you are doing.

## 2012-02-07 NOTE — Assessment & Plan Note (Signed)
Doing well, vaccines up to date. Slight detectable virus. CD4 up. Will see him back in 6 months with labs prior.

## 2012-02-13 ENCOUNTER — Encounter: Payer: Self-pay | Admitting: *Deleted
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
## 2012-03-06 ENCOUNTER — Ambulatory Visit: Payer: Medicaid Other | Admitting: Sports Medicine

## 2012-03-08 ENCOUNTER — Telehealth: Payer: Self-pay | Admitting: *Deleted

## 2012-03-08 ENCOUNTER — Other Ambulatory Visit: Payer: Self-pay | Admitting: Internal Medicine

## 2012-03-08 DIAGNOSIS — M25519 Pain in unspecified shoulder: Secondary | ICD-10-CM

## 2012-03-08 NOTE — Telephone Encounter (Signed)
Pt's sister calls and states pt wanted an appt w/ dr devinshire(sp?) the ortho md, can you send the referral to your nurse to complete, pt states his shoulder pain is worse and feels only this ortho will be suitable for him.

## 2012-03-12 NOTE — Telephone Encounter (Signed)
Patient would need to be seen in our clinic for history, physical exam and possible imaging studies prior to being referred to a specialist. Please set up an appointment with a clinic doctor as needed.  Thank you,  Tacuma Graffam

## 2012-03-16 ENCOUNTER — Emergency Department (HOSPITAL_COMMUNITY): Payer: Medicaid Other

## 2012-03-16 ENCOUNTER — Encounter (HOSPITAL_COMMUNITY): Payer: Self-pay | Admitting: Emergency Medicine

## 2012-03-16 ENCOUNTER — Emergency Department (HOSPITAL_COMMUNITY)
Admission: EM | Admit: 2012-03-16 | Discharge: 2012-03-17 | Disposition: A | Discharge status: Home / Self Care | Payer: Medicaid Other | Attending: Emergency Medicine | Admitting: Emergency Medicine

## 2012-03-16 DIAGNOSIS — Z87891 Personal history of nicotine dependence: Secondary | ICD-10-CM | POA: Insufficient documentation

## 2012-03-16 DIAGNOSIS — S43401A Unspecified sprain of right shoulder joint, initial encounter: Secondary | ICD-10-CM

## 2012-03-16 DIAGNOSIS — W1789XA Other fall from one level to another, initial encounter: Secondary | ICD-10-CM | POA: Insufficient documentation

## 2012-03-16 DIAGNOSIS — Z21 Asymptomatic human immunodeficiency virus [HIV] infection status: Secondary | ICD-10-CM | POA: Insufficient documentation

## 2012-03-16 DIAGNOSIS — IMO0002 Reserved for concepts with insufficient information to code with codable children: Secondary | ICD-10-CM | POA: Insufficient documentation

## 2012-03-16 DIAGNOSIS — S060XAA Concussion with loss of consciousness status unknown, initial encounter: Secondary | ICD-10-CM | POA: Insufficient documentation

## 2012-03-16 DIAGNOSIS — Z79899 Other long term (current) drug therapy: Secondary | ICD-10-CM | POA: Insufficient documentation

## 2012-03-16 DIAGNOSIS — J45909 Unspecified asthma, uncomplicated: Secondary | ICD-10-CM | POA: Insufficient documentation

## 2012-03-16 DIAGNOSIS — S12300A Unspecified displaced fracture of fourth cervical vertebra, initial encounter for closed fracture: Secondary | ICD-10-CM | POA: Insufficient documentation

## 2012-03-16 DIAGNOSIS — S060X9A Concussion with loss of consciousness of unspecified duration, initial encounter: Secondary | ICD-10-CM | POA: Insufficient documentation

## 2012-03-16 LAB — COMPREHENSIVE METABOLIC PANEL
ALT: 12 U/L (ref 0–53)
AST: 24 U/L (ref 0–37)
Albumin: 4.4 g/dL (ref 3.5–5.2)
Alkaline Phosphatase: 109 U/L (ref 39–117)
BUN: 9 mg/dL (ref 6–23)
CO2: 29 mEq/L (ref 19–32)
Calcium: 9.2 mg/dL (ref 8.4–10.5)
Chloride: 100 mEq/L (ref 96–112)
Creatinine, Ser: 1.23 mg/dL (ref 0.50–1.35)
GFR calc Af Amer: 79 mL/min — ABNORMAL LOW (ref >90)
GFR calc non Af Amer: 68 mL/min — ABNORMAL LOW (ref >90)
Glucose, Bld: 92 mg/dL (ref 70–99)
Potassium: 4.2 mEq/L (ref 3.5–5.1)
Sodium: 136 mEq/L (ref 135–145)
Total Bilirubin: 0.3 mg/dL (ref 0.3–1.2)
Total Protein: 7.9 g/dL (ref 6.0–8.3)

## 2012-03-16 LAB — CBC WITH DIFFERENTIAL/PLATELET
Basophils Absolute: 0 10*3/uL (ref 0.0–0.1)
Basophils Relative: 1 % (ref 0–1)
Eosinophils Absolute: 0.2 10*3/uL (ref 0.0–0.7)
Eosinophils Relative: 5 % (ref 0–5)
HCT: 41 % (ref 39.0–52.0)
Hemoglobin: 14.1 g/dL (ref 13.0–17.0)
Lymphocytes Relative: 34 % (ref 12–46)
Lymphs Abs: 1.6 10*3/uL (ref 0.7–4.0)
MCH: 33.5 pg (ref 26.0–34.0)
MCHC: 34.4 g/dL (ref 30.0–36.0)
MCV: 97.4 fL (ref 78.0–100.0)
Monocytes Absolute: 0.4 10*3/uL (ref 0.1–1.0)
Monocytes Relative: 8 % (ref 3–12)
Neutro Abs: 2.5 10*3/uL (ref 1.7–7.7)
Neutrophils Relative %: 52 % (ref 43–77)
Platelets: 175 10*3/uL (ref 150–400)
RBC: 4.21 MIL/uL — ABNORMAL LOW (ref 4.22–5.81)
RDW: 12.9 % (ref 11.5–15.5)
WBC: 4.8 10*3/uL (ref 4.0–10.5)

## 2012-03-16 LAB — APTT: aPTT: 29 seconds (ref 24–37)

## 2012-03-16 LAB — PROTIME-INR
INR: 0.93 (ref 0.00–1.49)
Prothrombin Time: 12.7 seconds (ref 11.6–15.2)

## 2012-03-16 MED ORDER — MORPHINE SULFATE 4 MG/ML IJ SOLN
4.0000 mg | Freq: Once | INTRAMUSCULAR | Status: AC
  Administered 2012-03-16: 4 mg via INTRAVENOUS
  Filled 2012-03-16: qty 1

## 2012-03-16 MED ORDER — ONDANSETRON HCL 4 MG PO TABS: 4.0000 mg | ORAL_TABLET | Freq: Four times a day (QID) | ORAL | Status: AC

## 2012-03-16 MED ORDER — METHOCARBAMOL 500 MG PO TABS: 500.0000 mg | ORAL_TABLET | Freq: Two times a day (BID) | ORAL | Status: AC

## 2012-03-16 MED ORDER — METHOCARBAMOL 100 MG/ML IJ SOLN
1000.0000 mg | Freq: Once | INTRAMUSCULAR | Status: DC
  Filled 2012-03-16: qty 10

## 2012-03-16 MED ORDER — SODIUM CHLORIDE 0.9 % IV SOLN
Freq: Once | INTRAVENOUS | Status: AC
  Administered 2012-03-16: 20 mL/h via INTRAVENOUS

## 2012-03-16 MED ORDER — IBUPROFEN 600 MG PO TABS: 600.0000 mg | ORAL_TABLET | Freq: Four times a day (QID) | ORAL | Status: AC | PRN

## 2012-03-16 MED ORDER — ONDANSETRON HCL 4 MG/2ML IJ SOLN
4.0000 mg | Freq: Once | INTRAMUSCULAR | Status: AC
  Administered 2012-03-16: 4 mg via INTRAVENOUS
  Filled 2012-03-16: qty 2

## 2012-03-16 MED ORDER — DEXTROSE 5 % IV SOLN
1000.0000 mg | Freq: Once | INTRAVENOUS | Status: AC
  Administered 2012-03-16: 1000 mg via INTRAVENOUS
  Filled 2012-03-16: qty 10

## 2012-03-16 MED ORDER — OXYCODONE-ACETAMINOPHEN 5-325 MG PO TABS: 2.0000 | ORAL_TABLET | ORAL | Status: AC | PRN

## 2012-03-16 NOTE — ED Notes (Signed)
Patient transported to CT 

## 2012-03-16 NOTE — ED Notes (Signed)
Pt fell from tree earlier today; pt states he is having pain in mid back, neck and back of head; pt does have a raised knot on back of head; sister is at bedside and states this is abnormal for patient; awaiting MD at this time

## 2012-03-16 NOTE — ED Provider Notes (Signed)
History     CSN: [id number]  Arrival date & time 03/16/12  1756   First MD Initiated Contact with Patient 03/16/12 1828      Chief Complaint  Patient presents with  . Fall    (Consider location/radiation/quality/duration/timing/severity/associated sxs/prior treatment) HPI Pt fell from tree 8 ft landing on back. ?LOC. Pt had difficulty speaking initially. Now c/o occipital pain, cervical, thoracic and lumbar pain. No focal weakness, sensory changes. No abd pain, SOB. Mild sternal pain.  Past Medical History  Diagnosis Date  . HIV (human immunodeficiency virus infection)   . Pneumonia 07/12/10    SEPTIC SHOCK  . Severe frontal headaches 08/16/10  . Chest pain 07/12/10    pt states no hx of chest pain - 12/25/11  . Cyst     left nipple  . Asthma   . HIV (human immunodeficiency virus infection)   . Gastric ulcer     Past Surgical History  Procedure Date  . Upper leg soft tissue biopsy     left thigh  . Knee arthroscopy     right  . Acne cyst removal   . Breast surgery     left retroareolar abscess i and d, biopsy  . Colonoscopy 12/26/2011    Procedure: COLONOSCOPY;  Surgeon: Shirley Friar, MD;  Location: [facility];  Service: Endoscopy;  Laterality: N/A;  . Esophagogastroduodenoscopy 12/26/2011    Procedure: ESOPHAGOGASTRODUODENOSCOPY (EGD);  Surgeon: Shirley Friar, MD;  Location: [facility];  Service: Endoscopy;  Laterality: N/A;    Family History  Problem Relation Age of Onset  . Stroke Mother   . Cancer Mother   . Stroke Father   . Diabetes Father   . Hypertension Sister     History  Substance Use Topics  . Smoking status: Former Smoker    Types: Cigars    Quit date: 05/22/2010  . Smokeless tobacco: Never Used  . Alcohol Use: No     rare      Review of Systems  Constitutional: Negative for fever and chills.  HENT: Positive for neck pain. Negative for neck stiffness.   Respiratory: Negative for shortness of breath.   Gastrointestinal:  Negative for nausea, vomiting and abdominal pain.  Musculoskeletal: Positive for myalgias, back pain and arthralgias.  Skin: Negative for rash and wound.  Neurological: Positive for headaches. Negative for dizziness, weakness, light-headedness and numbness.    Allergies  Penicillins and Sulfonamide derivatives  Home Medications   Current Outpatient Rx  Name Route Sig Dispense Refill  . ALBUTEROL SULFATE HFA 108 (90 BASE) MCG/ACT IN AERS Inhalation Inhale 2 puffs into the lungs every 4 (four) hours as needed for wheezing. 1 each 0  . BUDESONIDE-FORMOTEROL FUMARATE 80-4.5 MCG/ACT IN AERO Inhalation Inhale 2 puffs into the lungs 2 (two) times daily. 1 Inhaler 11  . DARUNAVIR ETHANOLATE 800 MG PO TABS Oral Take 1 tablet (800 mg total) by mouth daily. 30 tablet 6  . DICLOFENAC SODIUM 1 % TD GEL Topical Apply 1 application topically 2 (two) times daily. 100 g 2  . EMTRICITABINE-TENOFOVIR 200-300 MG PO TABS Oral Take 1 tablet by mouth daily. 30 tablet 6  . FOLIC ACID 1 MG PO TABS Oral Take 1 tablet (1 mg total) by mouth daily. 30 tablet 11  . GABAPENTIN 600 MG PO TABS Oral Take 600 mg by mouth 4 (four) times daily.     [name] HYDROXYZINE PAMOATE 25 MG PO CAPS Oral Take 1 capsule (25 mg total) by mouth 3 (three)  times daily as needed. For itching/One capsule by mouth every 6 hours when necessary 30 capsule 1  . MELOXICAM 7.5 MG PO TABS Oral Take 1 tablet (7.5 mg total) by mouth daily. 30 tablet 2  . ADULT MULTIVITAMIN W/MINERALS CH Oral Take 1 tablet by mouth daily.    [name] MUPIROCIN 2 % EX OINT Topical Apply 1 application topically 2 (two) times daily.    [name] OMEPRAZOLE 40 MG PO CPDR Oral Take 40 mg by mouth daily.     [name] RALTEGRAVIR POTASSIUM 400 MG PO TABS Oral Take 1 tablet (400 mg total) by mouth 2 (two) times daily. 60 tablet 6  . RITONAVIR 100 MG PO CAPS Oral Take 1 capsule (100 mg total) by mouth daily. With Prezista and Truvada 30 capsule 6  . TIZANIDINE HCL 6 MG PO CAPS Oral Take 6 mg by mouth  every 6 (six) hours.    . TRAZODONE HCL 50 MG PO TABS Oral Take 1 tablet (50 mg total) by mouth at bedtime. 30 tablet 2  . ZOLPIDEM TARTRATE ER 12.5 MG PO TBCR Oral Take 1 tablet (12.5 mg total) by mouth at bedtime as needed. For sleep 30 tablet 0  . ALBUTEROL SULFATE (2.5 MG/3ML) 0.083% IN NEBU Nebulization Take 3 mLs (2.5 mg total) by nebulization every 6 (six) hours as needed for wheezing. 75 mL 3  . IBUPROFEN 600 MG PO TABS Oral Take 1 tablet (600 mg total) by mouth every 6 (six) hours as needed for pain. 30 tablet 0  . METHOCARBAMOL 500 MG PO TABS Oral Take 1 tablet (500 mg total) by mouth 2 (two) times daily. 20 tablet 0  . ONDANSETRON HCL 4 MG PO TABS Oral Take 1 tablet (4 mg total) by mouth every 6 (six) hours. 12 tablet 0  . OXYCODONE-ACETAMINOPHEN 5-325 MG PO TABS Oral Take 2 tablets by mouth every 4 (four) hours as needed for pain. 15 tablet 0    BP 127/79  Pulse 65  Temp 98.6 F (37 C) (Oral)  Resp 15  Ht 5\' 9"  (1.753 m)  Wt 207 lb (93.895 kg)  BMI 30.57 kg/m2  SpO2 95%  Physical Exam  Nursing note and vitals reviewed. Constitutional: He is oriented to person, place, and time. He appears well-developed and well-nourished. No distress.  HENT:  Head: Normocephalic and atraumatic.  Mouth/Throat: Oropharynx is clear and moist.       Mild posterior scalp TTP  Eyes: EOM are normal. Pupils are equal, round, and reactive to light.  Neck:       C-Collar placed by RN in triage  Cardiovascular: Normal rate and regular rhythm.   Pulmonary/Chest: Effort normal and breath sounds normal. No respiratory distress. He has no wheezes. He has no rales. He exhibits tenderness (mild ttp over sternum. No deformity).  Abdominal: Soft. Bowel sounds are normal. He exhibits no distension and no mass. There is no tenderness. There is no rebound and no guarding.  Musculoskeletal: Normal range of motion. He exhibits tenderness (TTP midline entire spine without focality or deformtiy ). He exhibits no  edema.  Neurological: He is alert and oriented to person, place, and time.       5/5 motor, sensation intact  Skin: Skin is warm and dry. No rash noted. No erythema.  Psychiatric: He has a normal mood and affect. His behavior is normal.    ED Course  Procedures (including critical care time)  Labs Reviewed  CBC WITH DIFFERENTIAL - Abnormal; Notable for the following:  RBC 4.21 (*)     All other components within normal limits  COMPREHENSIVE METABOLIC PANEL - Abnormal; Notable for the following:    GFR calc non Af Amer 68 (*)     GFR calc Af Amer 79 (*)     All other components within normal limits  PROTIME-INR  APTT   Dg Chest 1 View  03/16/2012  *RADIOLOGY REPORT*  Clinical Data: Fall.  Back pain.  CHEST - 1 VIEW  Comparison: 11/29/2011  Findings: Heart size is normal.  Mediastinal shadows are normal. Lungs are clear.  No effusions.  No visible fractures.  There is curvature of the upper spine.  IMPRESSION: No acute or traumatic finding.  Upper thoracic curvature.   Original Report Authenticated By: Thomasenia Sales, M.D.    Dg Thoracic Spine 2 View  03/16/2012  *RADIOLOGY REPORT*  Clinical Data: Fall with mid back pain.  THORACIC SPINE - 2 VIEW  Comparison: 11/29/2011 and prior chest radiographs  Findings: A minimal apex left upper thoracic scoliosis is again identified. Normal alignment is otherwise noted. There is no evidence of fracture or subluxation. No focal bony lesions are identified.  IMPRESSION: No evidence of acute abnormality.  Minimal upper thoracic scoliosis again noted.   Original Report Authenticated By: Rosendo Gros, M.D.    Dg Shoulder Right  03/16/2012  *RADIOLOGY REPORT*  Clinical Data: Right shoulder pain status post fall.  RIGHT SHOULDER - 2+ VIEW  Comparison:  None  Findings: No displaced fracture or dislocation.  Mild AC joint degenerative change.  IMPRESSION: No acute fracture or dislocation.   Original Report Authenticated By: Waneta Martins, M.D.    Ct  Head Wo Contrast  03/16/2012  *RADIOLOGY REPORT*  Clinical Data:  Fall with trauma to the head and neck.  CT HEAD WITHOUT CONTRAST CT CERVICAL SPINE WITHOUT CONTRAST  Technique:  Multidetector CT imaging of the head and cervical spine was performed following the standard protocol without intravenous contrast.  Multiplanar CT image reconstructions of the cervical spine were also generated.  Comparison:  11/29/2011  CT HEAD  Findings: Pain the brain has a normal appearance without evidence of focal lesions.  No sign of infarction, mass lesion, hemorrhage, hydrocephalus or extra-axial collection.  No skull fracture.  No fluid in the sinuses, middle ears or mastoids.  IMPRESSION: Normal head CT  CT CERVICAL SPINE  Findings: No traumatic malalignment.  There is a nondisplaced fracture at the anterior aspect of the C4 vertebral body, at the base of osteophyte.  No other fractures seen.  There is ordinary osteoarthritis at the C1-2 articulation.  C2-3 is normal except for mild facet degeneration on the right.  C3-4 is normal.  C4-5 shows spondylosis with foraminal encroachment by osteophytes bilaterally.  C5-6 shows spondylosis with foraminal encroachment by osteophytes bilaterally.  C6-7 shows spondylosis with foraminal encroachment by osteophytes bilaterally.  C7-T1 shows facet arthropathy worse on the left with some foraminal narrowing left worse than right.  IMPRESSION: Nondisplaced fracture at the junction of the vertebral body and an anterior osteophyte at C4.  No traumatic malalignment.  Degenerative spondylosis with osteophytic encroachment upon the canal and foramina at C4-5, C5-6 and C6-7.  I discussed the case with Dr. Ranae Palms.   Original Report Authenticated By: Thomasenia Sales, M.D.    Ct Cervical Spine Wo Contrast  03/16/2012  *RADIOLOGY REPORT*  Clinical Data:  Fall with trauma to the head and neck.  CT HEAD WITHOUT CONTRAST CT CERVICAL SPINE WITHOUT CONTRAST  Technique:  Multidetector CT imaging of the  head and cervical spine was performed following the standard protocol without intravenous contrast.  Multiplanar CT image reconstructions of the cervical spine were also generated.  Comparison:  11/29/2011  CT HEAD  Findings: Pain the brain has a normal appearance without evidence of focal lesions.  No sign of infarction, mass lesion, hemorrhage, hydrocephalus or extra-axial collection.  No skull fracture.  No fluid in the sinuses, middle ears or mastoids.  IMPRESSION: Normal head CT  CT CERVICAL SPINE  Findings: No traumatic malalignment.  There is a nondisplaced fracture at the anterior aspect of the C4 vertebral body, at the base of osteophyte.  No other fractures seen.  There is ordinary osteoarthritis at the C1-2 articulation.  C2-3 is normal except for mild facet degeneration on the right.  C3-4 is normal.  C4-5 shows spondylosis with foraminal encroachment by osteophytes bilaterally.  C5-6 shows spondylosis with foraminal encroachment by osteophytes bilaterally.  C6-7 shows spondylosis with foraminal encroachment by osteophytes bilaterally.  C7-T1 shows facet arthropathy worse on the left with some foraminal narrowing left worse than right.  IMPRESSION: Nondisplaced fracture at the junction of the vertebral body and an anterior osteophyte at C4.  No traumatic malalignment.  Degenerative spondylosis with osteophytic encroachment upon the canal and foramina at C4-5, C5-6 and C6-7.  I discussed the case with Dr. Ranae Palms.   Original Report Authenticated By: Thomasenia Sales, M.D.      1. C4 cervical fracture   2. Sprain of shoulder, right   3. Concussion       MDM  Discussed with Dr Yetta Barre who reviewed pt's CT. Stable fracture. Advised placing pt in aspen collar and following up in office in several weeks for repeat films. Discussed with pt and family and are agreeable to plan.         Loren Racer, MD 03/16/12 2322

## 2012-03-16 NOTE — ED Notes (Signed)
RN request labs be obtained with the start of IV
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
## 2012-03-17 MED ORDER — DIPHENHYDRAMINE HCL 50 MG/ML IJ SOLN
12.5000 mg | Freq: Once | INTRAMUSCULAR | Status: AC
  Administered 2012-03-17: 12.5 mg via INTRAVENOUS
  Filled 2012-03-17: qty 1

## 2012-03-17 NOTE — ED Notes (Signed)
MD at bedside.  EDP Campos present to evaluate this pt for allergic reaction post given morphine

## 2012-03-19 ENCOUNTER — Ambulatory Visit: Payer: Medicaid Other | Admitting: Sports Medicine

## 2012-04-12 ENCOUNTER — Telehealth: Payer: Self-pay | Admitting: *Deleted

## 2012-04-12 ENCOUNTER — Emergency Department (HOSPITAL_COMMUNITY)
Admission: EM | Admit: 2012-04-12 | Discharge: 2012-04-12 | Disposition: A | Discharge status: Home / Self Care | Payer: Medicaid Other | Attending: Emergency Medicine | Admitting: Emergency Medicine

## 2012-04-12 ENCOUNTER — Encounter: Payer: Self-pay | Admitting: *Deleted

## 2012-04-12 ENCOUNTER — Ambulatory Visit (HOSPITAL_COMMUNITY)
Admission: RE | Admit: 2012-04-12 | Discharge: 2012-04-12 | Disposition: A | Discharge status: Home / Self Care | Payer: Medicaid Other | Source: Ambulatory Visit | Attending: Internal Medicine | Admitting: Internal Medicine

## 2012-04-12 ENCOUNTER — Encounter (HOSPITAL_COMMUNITY): Payer: Self-pay | Admitting: *Deleted

## 2012-04-12 ENCOUNTER — Ambulatory Visit (INDEPENDENT_AMBULATORY_CARE_PROVIDER_SITE_OTHER): Payer: Medicaid Other | Admitting: Internal Medicine

## 2012-04-12 VITALS — BP 138/92 | HR 76 | Temp 98.0°F | Wt 218.3 lb

## 2012-04-12 DIAGNOSIS — Z21 Asymptomatic human immunodeficiency virus [HIV] infection status: Secondary | ICD-10-CM | POA: Insufficient documentation

## 2012-04-12 DIAGNOSIS — R059 Cough, unspecified: Secondary | ICD-10-CM

## 2012-04-12 DIAGNOSIS — Z4689 Encounter for fitting and adjustment of other specified devices: Secondary | ICD-10-CM | POA: Insufficient documentation

## 2012-04-12 DIAGNOSIS — J189 Pneumonia, unspecified organism: Secondary | ICD-10-CM | POA: Insufficient documentation

## 2012-04-12 DIAGNOSIS — J45909 Unspecified asthma, uncomplicated: Secondary | ICD-10-CM | POA: Insufficient documentation

## 2012-04-12 DIAGNOSIS — R05 Cough: Secondary | ICD-10-CM

## 2012-04-12 DIAGNOSIS — S129XXA Fracture of neck, unspecified, initial encounter: Secondary | ICD-10-CM

## 2012-04-12 DIAGNOSIS — J209 Acute bronchitis, unspecified: Secondary | ICD-10-CM | POA: Insufficient documentation

## 2012-04-12 LAB — CBC WITH DIFFERENTIAL/PLATELET
Basophils Absolute: 0 10*3/uL (ref 0.0–0.1)
Basophils Relative: 0 % (ref 0–1)
Eosinophils Absolute: 0.9 10*3/uL — ABNORMAL HIGH (ref 0.0–0.7)
Eosinophils Relative: 15 % — ABNORMAL HIGH (ref 0–5)
HCT: 41.6 % (ref 39.0–52.0)
Hemoglobin: 14.4 g/dL (ref 13.0–17.0)
Lymphocytes Relative: 40 % (ref 12–46)
Lymphs Abs: 2.3 10*3/uL (ref 0.7–4.0)
MCH: 31.6 pg (ref 26.0–34.0)
MCHC: 34.6 g/dL (ref 30.0–36.0)
MCV: 91.2 fL (ref 78.0–100.0)
Monocytes Absolute: 0.5 10*3/uL (ref 0.1–1.0)
Monocytes Relative: 9 % (ref 3–12)
Neutro Abs: 2.1 10*3/uL (ref 1.7–7.7)
Neutrophils Relative %: 36 % — ABNORMAL LOW (ref 43–77)
Platelets: 172 10*3/uL (ref 150–400)
RBC: 4.56 MIL/uL (ref 4.22–5.81)
RDW: 12.7 % (ref 11.5–15.5)
WBC: 5.8 10*3/uL (ref 4.0–10.5)

## 2012-04-12 LAB — COMPLETE METABOLIC PANEL WITH GFR
ALT: 12 U/L (ref 0–53)
AST: 14 U/L (ref 0–37)
Albumin: 4 g/dL (ref 3.5–5.2)
Alkaline Phosphatase: 131 U/L — ABNORMAL HIGH (ref 39–117)
BUN: 7 mg/dL (ref 6–23)
CO2: 31 mEq/L (ref 19–32)
Calcium: 10 mg/dL (ref 8.4–10.5)
Chloride: 103 mEq/L (ref 96–112)
Creat: 1.48 mg/dL — ABNORMAL HIGH (ref 0.50–1.35)
GFR, Est African American: 64 mL/min
GFR, Est Non African American: 55 mL/min — ABNORMAL LOW
Glucose, Bld: 94 mg/dL (ref 70–99)
Potassium: 4.6 mEq/L (ref 3.5–5.3)
Sodium: 141 mEq/L (ref 135–145)
Total Bilirubin: 0.4 mg/dL (ref 0.3–1.2)
Total Protein: 7.9 g/dL (ref 6.0–8.3)

## 2012-04-12 MED ORDER — LEVOFLOXACIN 500 MG PO TABS: 500.0000 mg | ORAL_TABLET | Freq: Every day | ORAL | Status: AC

## 2012-04-12 NOTE — Progress Notes (Signed)
  Subjective:    Patient ID: Casey Brennan, male    DOB: [birth year], 51 y.o.   MRN: [id number]  HPI  Patient come in with chief complaint of fever, cough a/w brown sputum, sore throat which is worsening since last 4 days. Able to tolerate food and fluids ok. H/O sick contact 4 days ago.  ROS + for loose stools which are now better, shoulder pain, lower ext swelling.  Review of Systems  All other systems reviewed and are negative.       Objective:   Physical Exam  Constitutional: He is oriented to person, place, and time. He appears well-developed and well-nourished.  HENT:  Head: Normocephalic and atraumatic.       Cervical collar in place  Eyes: Conjunctivae normal and EOM are normal. Pupils are equal, round, and reactive to light. No scleral icterus.  Neck: Normal range of motion. Neck supple. No JVD present. No thyromegaly present.  Cardiovascular: Normal rate, regular rhythm, normal heart sounds and intact distal pulses.  Exam reveals no gallop and no friction rub.   No murmur heard. Pulmonary/Chest: Effort normal and breath sounds normal. No respiratory distress. He has no wheezes. He has no rales.  Abdominal: Soft. Bowel sounds are normal. He exhibits no distension and no mass. There is no tenderness. There is no rebound and no guarding.  Musculoskeletal: Normal range of motion. He exhibits no edema and no tenderness.  Lymphadenopathy:    He has no cervical adenopathy.  Neurological: He is alert and oriented to person, place, and time.  Psychiatric: He has a normal mood and affect. His behavior is normal.          Assessment & Plan:

## 2012-04-12 NOTE — ED Provider Notes (Addendum)
MSE was initiated and I personally evaluated the patient and placed orders (if any) at  6:39 PM on April 12, 2012.  The patient appears stable so that the remainder of the MSE may be completed by another provider.   He is here simply to get another Aspen collar. When he is wearing has a crack in it. He has no complaints.  [name], MD 04/12/12 1840  [name], MD 04/12/12 1901
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
## 2012-04-12 NOTE — Assessment & Plan Note (Signed)
Given nebulizer machine to take home.

## 2012-04-12 NOTE — Patient Instructions (Signed)
Take levofloxacin every day for five days. Call or return to the clinic if your symptoms do not improve.

## 2012-04-12 NOTE — ED Notes (Signed)
He s to get his c-collar replaced.  The collar has a crack in it

## 2012-04-12 NOTE — Telephone Encounter (Signed)
Pt's sister called stating pt is c/o productive cough, sinus pressure, headache, pain to neck and shoulder and SOB. Fever 99.1 Onset 5 days ago, has tried OTC cough meds, tylenol, increased fluids.  Using inhalers.  Not getting better.   S/P C4 Cervical Fx on 9/14  Will see today.

## 2012-04-12 NOTE — Assessment & Plan Note (Deleted)
Symptoms are concerning for cap. I will obtain CXR 2 view and get labs. Patient should be treated with 5 day course of Levofloxacin. And follow up as needed.

## 2012-04-12 NOTE — Assessment & Plan Note (Signed)
Symptoms are concerning for cap. I will obtain CXR 2 view and get labs.  The labs and CXR does not support PNA. I would still give him 5 days antibiotics due to immunocompromised state and suspected acute bronchitis. Fluid intake and nebulization as needed. Follow up as needed.

## 2012-05-08 ENCOUNTER — Encounter: Payer: Self-pay | Admitting: *Deleted

## 2012-05-08 ENCOUNTER — Encounter: Payer: Self-pay | Admitting: Infectious Diseases

## 2012-05-15 DIAGNOSIS — B20 Human immunodeficiency virus [HIV] disease: Secondary | ICD-10-CM | POA: Insufficient documentation

## 2012-05-15 DIAGNOSIS — IMO0002 Reserved for concepts with insufficient information to code with codable children: Secondary | ICD-10-CM | POA: Insufficient documentation

## 2012-06-10 ENCOUNTER — Encounter: Payer: Self-pay | Admitting: Infectious Diseases

## 2012-06-12 ENCOUNTER — Ambulatory Visit (INDEPENDENT_AMBULATORY_CARE_PROVIDER_SITE_OTHER): Payer: Medicaid Other | Admitting: Infectious Diseases

## 2012-06-12 ENCOUNTER — Encounter: Payer: Self-pay | Admitting: Infectious Diseases

## 2012-06-12 VITALS — BP 141/97 | HR 82 | Temp 98.3°F | Ht 69.0 in | Wt 216.5 lb

## 2012-06-12 DIAGNOSIS — N289 Disorder of kidney and ureter, unspecified: Secondary | ICD-10-CM

## 2012-06-12 DIAGNOSIS — D649 Anemia, unspecified: Secondary | ICD-10-CM

## 2012-06-12 DIAGNOSIS — Z113 Encounter for screening for infections with a predominantly sexual mode of transmission: Secondary | ICD-10-CM

## 2012-06-12 DIAGNOSIS — S91109A Unspecified open wound of unspecified toe(s) without damage to nail, initial encounter: Secondary | ICD-10-CM

## 2012-06-12 DIAGNOSIS — Z23 Encounter for immunization: Secondary | ICD-10-CM

## 2012-06-12 DIAGNOSIS — Z79899 Other long term (current) drug therapy: Secondary | ICD-10-CM

## 2012-06-12 DIAGNOSIS — B20 Human immunodeficiency virus [HIV] disease: Secondary | ICD-10-CM

## 2012-06-12 LAB — BASIC METABOLIC PANEL
BUN: 10 mg/dL (ref 6–23)
CO2: 26 mEq/L (ref 19–32)
Calcium: 9.4 mg/dL (ref 8.4–10.5)
Chloride: 104 mEq/L (ref 96–112)
Creat: 1.1 mg/dL (ref 0.50–1.35)
Glucose, Bld: 89 mg/dL (ref 70–99)
Potassium: 4.3 mEq/L (ref 3.5–5.3)
Sodium: 140 mEq/L (ref 135–145)

## 2012-06-12 MED ORDER — CYANOCOBALAMIN 1000 MCG/ML IJ SOLN
1000.0000 ug | Freq: Once | INTRAMUSCULAR | Status: AC
  Administered 2012-06-12: 1000 ug via INTRAMUSCULAR

## 2012-06-12 NOTE — Assessment & Plan Note (Signed)
He is doing well, slight elevation of VL. Offered/refused condoms. Gets flu shot, B12 injection today. Will see him back in 4-5 months.

## 2012-06-12 NOTE — Addendum Note (Signed)
Addended by: Jennet Maduro D on: 06/12/2012 01:12 PM   Modules accepted: Orders

## 2012-06-12 NOTE — Assessment & Plan Note (Signed)
Wound is healing well. Greatly appreciate Dr Wynelle Cleveland. They may also go to wound care center.

## 2012-06-12 NOTE — Assessment & Plan Note (Signed)
His Cr was up at last draw. Will recheck, have him see nephro if increasing further. Greatly appreciate IM f/u.
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
## 2012-07-18 ENCOUNTER — Other Ambulatory Visit: Payer: Self-pay | Admitting: *Deleted

## 2012-07-18 MED ORDER — TRAZODONE HCL 50 MG PO TABS: 50.0000 mg | ORAL_TABLET | Freq: Every day | ORAL | Status: DC

## 2012-07-18 NOTE — Telephone Encounter (Signed)
Pls sch Dr Eben Burow about April for a 6 month routine F/U appt. I gave refills to last until appt.

## 2012-07-18 NOTE — Telephone Encounter (Signed)
Rx called in to pharmacy and flag sent to front desk pool for appt per Dr Rogelia Boga.

## 2012-08-06 ENCOUNTER — Other Ambulatory Visit (HOSPITAL_COMMUNITY): Payer: Self-pay | Admitting: Urology

## 2012-08-06 DIAGNOSIS — R9389 Abnormal findings on diagnostic imaging of other specified body structures: Secondary | ICD-10-CM

## 2012-08-06 DIAGNOSIS — M899 Disorder of bone, unspecified: Secondary | ICD-10-CM

## 2012-08-16 ENCOUNTER — Encounter (HOSPITAL_COMMUNITY)
Admission: RE | Admit: 2012-08-16 | Discharge: 2012-08-16 | Disposition: A | Discharge status: Home / Self Care | Payer: Medicaid Other | Source: Ambulatory Visit | Attending: Urology | Admitting: Urology

## 2012-08-16 DIAGNOSIS — R9389 Abnormal findings on diagnostic imaging of other specified body structures: Secondary | ICD-10-CM | POA: Insufficient documentation

## 2012-08-16 DIAGNOSIS — M949 Disorder of cartilage, unspecified: Secondary | ICD-10-CM | POA: Insufficient documentation

## 2012-08-16 DIAGNOSIS — M899 Disorder of bone, unspecified: Secondary | ICD-10-CM | POA: Insufficient documentation

## 2012-08-16 MED ORDER — TECHNETIUM TC 99M MEDRONATE IV KIT
25.0000 | PACK | Freq: Once | INTRAVENOUS | Status: AC | PRN
  Administered 2012-08-16: 25.8 via INTRAVENOUS

## 2012-08-18 ENCOUNTER — Other Ambulatory Visit: Payer: Self-pay | Admitting: Infectious Diseases

## 2012-08-19 ENCOUNTER — Other Ambulatory Visit: Payer: Self-pay | Admitting: *Deleted

## 2012-08-20 ENCOUNTER — Other Ambulatory Visit: Payer: Self-pay | Admitting: *Deleted

## 2012-08-20 DIAGNOSIS — J45909 Unspecified asthma, uncomplicated: Secondary | ICD-10-CM

## 2012-08-20 MED ORDER — BUDESONIDE-FORMOTEROL FUMARATE 80-4.5 MCG/ACT IN AERO: 2.0000 | INHALATION_SPRAY | Freq: Two times a day (BID) | RESPIRATORY_TRACT | Status: DC

## 2012-08-20 MED ORDER — ZOLPIDEM TARTRATE ER 12.5 MG PO TBCR: 12.5000 mg | EXTENDED_RELEASE_TABLET | Freq: Every evening | ORAL | Status: DC | PRN

## 2012-08-20 NOTE — Telephone Encounter (Signed)
Faxed in

## 2012-08-20 NOTE — Telephone Encounter (Signed)
Last refilled 02/07/2012

## 2012-08-22 ENCOUNTER — Encounter: Payer: Self-pay | Admitting: Infectious Diseases

## 2012-08-22 ENCOUNTER — Ambulatory Visit (INDEPENDENT_AMBULATORY_CARE_PROVIDER_SITE_OTHER): Payer: Medicaid Other | Admitting: Infectious Diseases

## 2012-08-22 VITALS — BP 135/82 | HR 83 | Temp 98.1°F | Ht 68.0 in | Wt 215.0 lb

## 2012-08-22 DIAGNOSIS — Z113 Encounter for screening for infections with a predominantly sexual mode of transmission: Secondary | ICD-10-CM

## 2012-08-22 DIAGNOSIS — Z79899 Other long term (current) drug therapy: Secondary | ICD-10-CM

## 2012-08-22 DIAGNOSIS — B351 Tinea unguium: Secondary | ICD-10-CM

## 2012-08-22 DIAGNOSIS — B20 Human immunodeficiency virus [HIV] disease: Secondary | ICD-10-CM

## 2012-08-22 DIAGNOSIS — M948X9 Other specified disorders of cartilage, unspecified sites: Secondary | ICD-10-CM

## 2012-08-22 DIAGNOSIS — M898X5 Other specified disorders of bone, thigh: Secondary | ICD-10-CM | POA: Insufficient documentation

## 2012-08-22 DIAGNOSIS — N289 Disorder of kidney and ureter, unspecified: Secondary | ICD-10-CM

## 2012-08-22 LAB — CBC
HCT: 40.7 % (ref 39.0–52.0)
Hemoglobin: 13.7 g/dL (ref 13.0–17.0)
MCH: 31.6 pg (ref 26.0–34.0)
MCHC: 33.7 g/dL (ref 30.0–36.0)
MCV: 93.8 fL (ref 78.0–100.0)
Platelets: 196 10*3/uL (ref 150–400)
RBC: 4.34 MIL/uL (ref 4.22–5.81)
RDW: 13.4 % (ref 11.5–15.5)
WBC: 4.3 10*3/uL (ref 4.0–10.5)

## 2012-08-22 LAB — COMPREHENSIVE METABOLIC PANEL
ALT: 12 U/L (ref 0–53)
AST: 18 U/L (ref 0–37)
Albumin: 4.3 g/dL (ref 3.5–5.2)
Alkaline Phosphatase: 103 U/L (ref 39–117)
BUN: 12 mg/dL (ref 6–23)
CO2: 25 mEq/L (ref 19–32)
Calcium: 9.2 mg/dL (ref 8.4–10.5)
Chloride: 103 mEq/L (ref 96–112)
Creat: 1.23 mg/dL (ref 0.50–1.35)
Glucose, Bld: 106 mg/dL — ABNORMAL HIGH (ref 70–99)
Potassium: 4.2 mEq/L (ref 3.5–5.3)
Sodium: 136 mEq/L (ref 135–145)
Total Bilirubin: 0.4 mg/dL (ref 0.3–1.2)
Total Protein: 7.2 g/dL (ref 6.0–8.3)

## 2012-08-22 LAB — LIPID PANEL
Cholesterol: 176 mg/dL (ref 0–200)
HDL: 46 mg/dL (ref >39)
LDL Cholesterol: 108 mg/dL — ABNORMAL HIGH (ref 0–99)
Total CHOL/HDL Ratio: 3.8 Ratio
Triglycerides: 112 mg/dL (ref <150)
VLDL: 22 mg/dL (ref 0–40)

## 2012-08-22 LAB — RPR

## 2012-08-22 MED ORDER — CYANOCOBALAMIN 1000 MCG/ML IJ SOLN: 1000.0000 ug | Freq: Once | INTRAMUSCULAR | Status: DC

## 2012-08-22 MED ORDER — CYANOCOBALAMIN 1000 MCG/ML IJ SOLN: 500.0000 ug | Freq: Once | INTRAMUSCULAR | Status: DC

## 2012-08-22 MED ORDER — CYANOCOBALAMIN 1000 MCG/ML IJ SOLN
1000.0000 ug | Freq: Once | INTRAMUSCULAR | Status: AC
  Administered 2012-08-22: 1000 ug via INTRAMUSCULAR

## 2012-08-22 NOTE — Assessment & Plan Note (Signed)
Will check SPEP/UPEP

## 2012-08-22 NOTE — Addendum Note (Signed)
Addended by: Wendall Mola A on: 08/22/2012 11:26 AM   Modules accepted: Orders, Medications

## 2012-08-22 NOTE — Assessment & Plan Note (Signed)
Relatively better with last lab. Will continue to watch.

## 2012-08-22 NOTE — Assessment & Plan Note (Addendum)
Wants B12 shot. Will check his routine labs. Offfered/refused condoms. See him back in 3 months.

## 2012-08-22 NOTE — Assessment & Plan Note (Signed)
Advised pt to keep toes dry, keep gauze between toes. Use OTCs
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
## 2012-08-23 LAB — HEPATITIS B SURFACE ANTIBODY,QUALITATIVE: Hep B S Ab: REACTIVE — AB

## 2012-08-26 LAB — PROTEIN ELECTROPHORESIS, URINE REFLEX
Albumin: 39.7 %
Alpha-1-Globulin, U: 30.2 %
Alpha-2-Globulin, U: 10.4 %
Beta Globulin, U: 6.5 %
Gamma Globulin, U: 13.2 %
Total Protein, Urine: 19 mg/dL

## 2012-08-26 LAB — PROTEIN ELECTROPHORESIS, SERUM
Albumin ELP: 56.4 % (ref 55.8–66.1)
Alpha-1-Globulin: 4.4 % (ref 2.9–4.9)
Alpha-2-Globulin: 8.3 % (ref 7.1–11.8)
Beta 2: 5.7 % (ref 3.2–6.5)
Beta Globulin: 4.9 % (ref 4.7–7.2)
Gamma Globulin: 20.3 % — ABNORMAL HIGH (ref 11.1–18.8)
Total Protein, Serum Electrophoresis: 7.2 g/dL (ref 6.0–8.3)

## 2012-08-28 ENCOUNTER — Telehealth: Payer: Self-pay | Admitting: Oncology

## 2012-08-28 NOTE — Telephone Encounter (Signed)
S/W pt in re NP appt 03/19 @3  w/Dr. Gaylyn Rong.  Referring Dr. Johny Sax Dx- Lytic Bone Lesion Welcome packet mailed.

## 2012-08-29 ENCOUNTER — Telehealth: Payer: Self-pay | Admitting: Oncology

## 2012-08-29 NOTE — Telephone Encounter (Signed)
C/D 08/29/12 for appt. 09/18/12

## 2012-09-11 ENCOUNTER — Encounter: Payer: Self-pay | Admitting: Infectious Diseases

## 2012-09-11 ENCOUNTER — Other Ambulatory Visit: Payer: Self-pay | Admitting: Orthopedic Surgery

## 2012-09-11 DIAGNOSIS — R531 Weakness: Secondary | ICD-10-CM

## 2012-09-11 DIAGNOSIS — M5030 Other cervical disc degeneration, unspecified cervical region: Secondary | ICD-10-CM

## 2012-09-11 DIAGNOSIS — M542 Cervicalgia: Secondary | ICD-10-CM

## 2012-09-11 DIAGNOSIS — M5410 Radiculopathy, site unspecified: Secondary | ICD-10-CM

## 2012-09-12 ENCOUNTER — Encounter: Payer: Self-pay | Admitting: Oncology

## 2012-09-12 ENCOUNTER — Telehealth: Payer: Self-pay | Admitting: Oncology

## 2012-09-12 ENCOUNTER — Other Ambulatory Visit: Payer: Medicare Other | Admitting: Lab

## 2012-09-12 ENCOUNTER — Ambulatory Visit (HOSPITAL_BASED_OUTPATIENT_CLINIC_OR_DEPARTMENT_OTHER): Payer: Medicare Other | Admitting: Oncology

## 2012-09-12 ENCOUNTER — Ambulatory Visit: Payer: Medicare Other

## 2012-09-12 VITALS — BP 140/96 | HR 73 | Temp 97.1°F | Resp 18 | Ht 66.0 in | Wt 216.5 lb

## 2012-09-12 DIAGNOSIS — B20 Human immunodeficiency virus [HIV] disease: Secondary | ICD-10-CM | POA: Diagnosis not present

## 2012-09-12 DIAGNOSIS — M898X5 Other specified disorders of bone, thigh: Secondary | ICD-10-CM

## 2012-09-12 DIAGNOSIS — M949 Disorder of cartilage, unspecified: Secondary | ICD-10-CM | POA: Diagnosis not present

## 2012-09-12 DIAGNOSIS — M899 Disorder of bone, unspecified: Secondary | ICD-10-CM

## 2012-09-12 NOTE — Telephone Encounter (Signed)
gv and printed appt schedule for pt for April...pt awre cs will call to schedule Biopsy
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
## 2012-09-12 NOTE — Patient Instructions (Addendum)
1.  Issue:  Lytic bone lesion on CT scan. 2.  Potential causes:  Need to rule out cancer such as myeloma or from other source going to the bone (such as lung).  I recommended bone marrow biopsy by Radiologist.  If biopsy is negative, I may consider PET scan or CT scan to search for primary site.  If negative work up, then will assume negative.  3.  Follow up:  With me in about 3 weeks but sooner if results come out faster.

## 2012-09-13 ENCOUNTER — Ambulatory Visit
Admission: RE | Admit: 2012-09-13 | Discharge: 2012-09-13 | Disposition: A | Discharge status: Home / Self Care | Payer: Medicare Other | Source: Ambulatory Visit | Attending: Orthopedic Surgery | Admitting: Orthopedic Surgery

## 2012-09-13 DIAGNOSIS — M5410 Radiculopathy, site unspecified: Secondary | ICD-10-CM

## 2012-09-13 DIAGNOSIS — M5030 Other cervical disc degeneration, unspecified cervical region: Secondary | ICD-10-CM

## 2012-09-13 DIAGNOSIS — R531 Weakness: Secondary | ICD-10-CM

## 2012-09-13 DIAGNOSIS — M47812 Spondylosis without myelopathy or radiculopathy, cervical region: Secondary | ICD-10-CM | POA: Diagnosis not present

## 2012-09-13 DIAGNOSIS — M542 Cervicalgia: Secondary | ICD-10-CM

## 2012-09-13 LAB — KAPPA/LAMBDA LIGHT CHAINS
Kappa free light chain: 2.44 mg/dL — ABNORMAL HIGH (ref 0.33–1.94)
Kappa:Lambda Ratio: 1.06 (ref 0.26–1.65)
Lambda Free Lght Chn: 2.31 mg/dL (ref 0.57–2.63)

## 2012-09-13 LAB — PSA: PSA: 3.44 ng/mL (ref <=4.00)

## 2012-09-14 ENCOUNTER — Other Ambulatory Visit: Payer: Self-pay | Admitting: Infectious Diseases

## 2012-09-16 ENCOUNTER — Other Ambulatory Visit: Payer: Self-pay | Admitting: Oncology

## 2012-09-16 DIAGNOSIS — M898X5 Other specified disorders of bone, thigh: Secondary | ICD-10-CM

## 2012-09-17 ENCOUNTER — Other Ambulatory Visit: Payer: Self-pay | Admitting: Infectious Diseases

## 2012-09-18 ENCOUNTER — Other Ambulatory Visit: Payer: Medicaid Other | Admitting: Lab

## 2012-09-18 ENCOUNTER — Ambulatory Visit: Payer: Medicaid Other

## 2012-09-18 ENCOUNTER — Ambulatory Visit: Payer: Medicaid Other | Admitting: Oncology

## 2012-09-18 LAB — UIFE/LIGHT CHAINS/TP QN, 24-HR UR
Albumin, U: DETECTED
Alpha 1, Urine: DETECTED — AB
Alpha 2, Urine: DETECTED — AB
Beta, Urine: DETECTED — AB
Free Kappa Lt Chains,Ur: 8.02 mg/dL — ABNORMAL HIGH (ref 0.14–2.42)
Free Kappa/Lambda Ratio: 10.42 ratio — ABNORMAL HIGH (ref 2.04–10.37)
Free Lambda Excretion/Day: 7.7 mg/d
Free Lambda Lt Chains,Ur: 0.77 mg/dL — ABNORMAL HIGH (ref 0.02–0.67)
Free Lt Chn Excr Rate: 80.2 mg/d
Gamma Globulin, Urine: DETECTED — AB
Time: 24 hours
Total Protein, Urine-Ur/day: 185 mg/d — ABNORMAL HIGH (ref 10–140)
Total Protein, Urine: 18.5 mg/dL
Volume, Urine: 1000 mL

## 2012-09-19 NOTE — Progress Notes (Signed)
See note on 09/12/2012

## 2012-10-01 HISTORY — PX: OTHER SURGICAL HISTORY: SHX169

## 2012-10-04 ENCOUNTER — Ambulatory Visit: Payer: Medicare Other | Admitting: Oncology

## 2012-10-04 ENCOUNTER — Telehealth: Payer: Self-pay | Admitting: *Deleted

## 2012-10-04 NOTE — Progress Notes (Signed)
Bone marrow biopsy was not scheduled due to miscommunication.  This was rearranged and appointment delayed to about 2-3 weeks from now.

## 2012-10-04 NOTE — Telephone Encounter (Signed)
Call from [place] in CT scan states pt's sister was told pt has appt there today at 11:15 for Bone Marrow Biopsy.  Misty Stanley says pt is not scheduled for any scans or biopsy today.   Called [facility] and s/w Sheralyn Boatman who states appt for CT guided BMBx was never scheduled, but she will schedule it now.  S/w Tina in IR who states they did not have pt scheduled for any biopsy and it is too late for them to do it today.  She will work on getting him scheduled asap.  I called pt's sister, April,  Who states pt was called by someon in Radiology last Friday and informed of Biopsy appt today at 1 pm.  Then sister called for further instructions and says she s/w Judeth Cornfield who instructed on pt being NPO after Midnight and arriving at 11:15 am today for 1 pm BMBx.    She voiced a lot of frustration and concern about being called w/ appt and instructions and now being told there is no appt today and it cannot be done today.   Informed sister this RN is unsure of what happened, but I will s/w Director of Radiology to have her contact her and try to find out what happened.  Meanwhile the biopsy will be scheduled and then office visit w/ Dr. Gaylyn Rong will have to be r/s to about one week after Bx,  So Dr. Gaylyn Rong will have results to discuss on next visit.  Offered apologies for inconvenience. She verbalized understanding.  She missed class today to bring her brother for procedure and requests note for school stating she had to drive her brother for procedure.   Note left at front desk [facility] for her to pick up.

## 2012-10-07 ENCOUNTER — Other Ambulatory Visit: Payer: Self-pay | Admitting: Radiology

## 2012-10-11 ENCOUNTER — Ambulatory Visit (HOSPITAL_COMMUNITY)
Admission: RE | Admit: 2012-10-11 | Discharge: 2012-10-11 | Disposition: A | Discharge status: Home / Self Care | Payer: Medicaid Other | Source: Ambulatory Visit | Attending: Radiology | Admitting: Radiology

## 2012-10-11 ENCOUNTER — Other Ambulatory Visit: Payer: Self-pay | Admitting: Oncology

## 2012-10-11 ENCOUNTER — Encounter (HOSPITAL_COMMUNITY): Payer: Self-pay

## 2012-10-11 ENCOUNTER — Ambulatory Visit (HOSPITAL_COMMUNITY)
Admission: RE | Admit: 2012-10-11 | Discharge: 2012-10-11 | Disposition: A | Discharge status: Home / Self Care | Payer: Medicaid Other | Source: Ambulatory Visit | Attending: Oncology | Admitting: Oncology

## 2012-10-11 DIAGNOSIS — Z87891 Personal history of nicotine dependence: Secondary | ICD-10-CM | POA: Insufficient documentation

## 2012-10-11 DIAGNOSIS — Z79899 Other long term (current) drug therapy: Secondary | ICD-10-CM | POA: Insufficient documentation

## 2012-10-11 DIAGNOSIS — Z21 Asymptomatic human immunodeficiency virus [HIV] infection status: Secondary | ICD-10-CM | POA: Insufficient documentation

## 2012-10-11 DIAGNOSIS — M949 Disorder of cartilage, unspecified: Secondary | ICD-10-CM | POA: Insufficient documentation

## 2012-10-11 DIAGNOSIS — M899 Disorder of bone, unspecified: Secondary | ICD-10-CM | POA: Insufficient documentation

## 2012-10-11 DIAGNOSIS — Z8711 Personal history of peptic ulcer disease: Secondary | ICD-10-CM | POA: Insufficient documentation

## 2012-10-11 DIAGNOSIS — J45909 Unspecified asthma, uncomplicated: Secondary | ICD-10-CM | POA: Insufficient documentation

## 2012-10-11 DIAGNOSIS — M898X5 Other specified disorders of bone, thigh: Secondary | ICD-10-CM

## 2012-10-11 LAB — CBC
HCT: 27.7 % — ABNORMAL LOW (ref 39.0–52.0)
Hemoglobin: 9.5 g/dL — ABNORMAL LOW (ref 13.0–17.0)
MCH: 31.8 pg (ref 26.0–34.0)
MCHC: 34.3 g/dL (ref 30.0–36.0)
MCV: 92.6 fL (ref 78.0–100.0)
Platelets: 175 10*3/uL (ref 150–400)
RBC: 2.99 MIL/uL — ABNORMAL LOW (ref 4.22–5.81)
RDW: 15.1 % (ref 11.5–15.5)
WBC: 6.4 10*3/uL (ref 4.0–10.5)

## 2012-10-11 LAB — APTT: aPTT: 32 seconds (ref 24–37)

## 2012-10-11 LAB — PROTIME-INR
INR: 0.96 (ref 0.00–1.49)
Prothrombin Time: 12.7 seconds (ref 11.6–15.2)

## 2012-10-11 MED ORDER — FENTANYL CITRATE 0.05 MG/ML IJ SOLN
INTRAMUSCULAR | Status: AC
  Filled 2012-10-11: qty 4

## 2012-10-11 MED ORDER — HYDROCODONE-ACETAMINOPHEN 5-325 MG PO TABS: 1.0000 | ORAL_TABLET | ORAL | Status: DC | PRN

## 2012-10-11 MED ORDER — FENTANYL CITRATE 0.05 MG/ML IJ SOLN
INTRAMUSCULAR | Status: AC | PRN
  Administered 2012-10-11 (×2): 50 ug via INTRAVENOUS

## 2012-10-11 MED ORDER — MIDAZOLAM HCL 2 MG/2ML IJ SOLN
INTRAMUSCULAR | Status: AC
  Filled 2012-10-11: qty 4

## 2012-10-11 MED ORDER — MIDAZOLAM HCL 2 MG/2ML IJ SOLN
INTRAMUSCULAR | Status: AC | PRN
  Administered 2012-10-11 (×2): 1 mg via INTRAVENOUS

## 2012-10-11 MED ORDER — SODIUM CHLORIDE 0.9 % IV SOLN: Freq: Once | INTRAVENOUS | Status: DC

## 2012-10-11 NOTE — Progress Notes (Signed)
C/O CHEST TIGHTNESS 5/10 AND SHORTNESS OF BREATH WITH EXERTION AND KEVIN ALLRED,PA NOTIFIED AND ORDERS NOTED

## 2012-10-11 NOTE — H&P (Signed)
Casey Brennan is an 52 y.o. male.   Chief Complaint: "I'm here for a bone biopsy" HPI: Patient with history of HIV and newly discovered left iliac crest lytic lesion presents today for CT guided biopsy.  Past Medical History  Diagnosis Date  . HIV (human immunodeficiency virus infection) 1988  . Pneumonia 07/12/10    SEPTIC SHOCK  . Severe frontal headaches 08/16/10  . Chest pain 07/12/10    pt states no hx of chest pain - 12/25/11  . Renal cyst   . Asthma   . Gastric ulcer   . Hematuria     negative w/u with Dr. Jory Sims.  Assumed passed stone.     Past Surgical History  Procedure Laterality Date  . Upper leg soft tissue biopsy      left thigh  . Knee arthroscopy      right  . Acne cyst removal    . Breast surgery      left retroareolar abscess i and d, biopsy  . Colonoscopy  12/26/2011    Procedure: COLONOSCOPY;  Surgeon: Shirley Friar, MD;  Location: [facility];  Service: Endoscopy;  Laterality: N/A;  . Esophagogastroduodenoscopy  12/26/2011    Procedure: ESOPHAGOGASTRODUODENOSCOPY (EGD);  Surgeon: Shirley Friar, MD;  Location: [facility];  Service: Endoscopy;  Laterality: N/A;    Family History  Problem Relation Age of Onset  . Stroke Father   . Diabetes Father   . Cancer Father     brain cancer  . Hypertension Sister   . Cancer Maternal Uncle     prostate cancer   Social History:  reports that he quit smoking about 2 years ago. His smoking use included Cigars. He has never used smokeless tobacco. He reports that he does not drink alcohol or use illicit drugs.  Allergies:  Allergies  Allergen Reactions  . Penicillins Shortness Of Breath and Rash  . Sulfonamide Derivatives Shortness Of Breath and Rash    Current outpatient prescriptions:albuterol (PROVENTIL HFA;VENTOLIN HFA) 108 (90 BASE) MCG/ACT inhaler, Inhale 2 puffs into the lungs every 6 (six) hours as needed. For shortness of breath, Disp: , Rfl: ;  albuterol (PROVENTIL) (2.5 MG/3ML) 0.083%  nebulizer solution, Take 2.5 mg by nebulization every 6 (six) hours as needed. For shortness of breath, Disp: , Rfl:  budesonide-formoterol (SYMBICORT) 80-4.5 MCG/ACT inhaler, Inhale 2 puffs into the lungs 2 (two) times daily., Disp: 1 Inhaler, Rfl: 11;  diclofenac sodium (VOLTAREN) 1 % GEL, Apply 1 application topically 2 (two) times daily as needed. For pain, Disp: , Rfl: ;  emtricitabine-tenofovir (TRUVADA) 200-300 MG per tablet, Take 1 tablet by mouth daily., Disp: 30 tablet, Rfl: 6 folic acid (FOLVITE) 1 MG tablet, Take 1 tablet (1 mg total) by mouth daily., Disp: 30 tablet, Rfl: 11;  gabapentin (NEURONTIN) 600 MG tablet, Take 600 mg by mouth 4 (four) times daily. , Disp: , Rfl: ;  hydrOXYzine (VISTARIL) 25 MG capsule, Take 25 mg by mouth 4 (four) times daily as needed. For itching, Disp: , Rfl: ;  ISENTRESS 400 MG tablet, TAKE 1 TABLET BY MOUTH TWICE DAILY, Disp: 60 tablet, Rfl: 0 Multiple Vitamin (MULTIVITAMIN WITH MINERALS) TABS, Take 1 tablet by mouth daily., Disp: , Rfl: ;  mupirocin ointment (BACTROBAN) 2 %, APPLY TOPICALLY TO THE AFFECTED AREA TWICE DAILY FOR 2 WEEKS. PLACE COTTON BETWEEN TOES TO ALLOW BETTER AERATION, Disp: 66 g, Rfl: 0;  NORVIR 100 MG TABS, TAKE 1 TABLET BY MOUTH DAILY WITH PREZISTA AND TRUVADA, Disp: 30  tablet, Rfl: 0;  omeprazole (PRILOSEC) 40 MG capsule, Take 40 mg by mouth daily. , Disp: , Rfl:  PREZISTA 800 MG tablet, TAKE 1 TABLET BY MOUTH DAILY, Disp: 30 tablet, Rfl: 0;  tizanidine (ZANAFLEX) 6 MG capsule, Take 6 mg by mouth every 6 (six) hours., Disp: , Rfl: ;  traZODone (DESYREL) 50 MG tablet, Take 1 tablet (50 mg total) by mouth at bedtime., Disp: 30 tablet, Rfl: 3;  zolpidem (AMBIEN CR) 12.5 MG CR tablet, Take 1 tablet (12.5 mg total) by mouth at bedtime as needed. For sleep, Disp: 30 tablet, Rfl: 0 [DISCONTINUED] famotidine (PEPCID AC) 10 MG tablet, One po bid x 3 days and then prn upper abdominal pain, Disp: 20 tablet, Rfl: 0 Current facility-administered  medications:0.9 %  sodium chloride infusion, , Intravenous, Once, Robet Leu, PA-C  10/11/12 labs pending Review of Systems  Constitutional: Positive for malaise/fatigue. Negative for fever and chills.  HENT:       Occ HA's  Respiratory: Negative for wheezing.        Occ cough,dyspnea  Cardiovascular: Negative for chest pain.  Gastrointestinal: Negative for nausea, vomiting and abdominal pain.  Genitourinary:       Prior hx hematuria  Musculoskeletal: Negative for back pain.    Blood pressure 130/78, pulse 85, temperature 97 F (36.1 C), temperature source Oral, resp. rate 18, height 5\' 9"  (1.753 m), weight 205 lb (92.987 kg), SpO2 92.00%. Physical Exam  Constitutional: He is oriented to person, place, and time. He appears well-developed and well-nourished.  Cardiovascular: Normal rate and regular rhythm.   occ ectopy noted  Respiratory: Effort normal.  Distant BS; no wheezes  GI: Soft. Bowel sounds are normal. There is no tenderness.  Musculoskeletal: Normal range of motion. He exhibits no edema.  Neurological: He is alert and oriented to person, place, and time.     Assessment/Plan Pt with hx HIV and newly discovered left iliac crest lytic lesion. Plan is for CT guided biopsy of the lytic lesion today. Details/risks of procedure d/w pt with his understanding and consent.  Vira Chaplin,D KEVIN 10/11/2012, 8:16 AM

## 2012-10-11 NOTE — Procedures (Signed)
CT guided aspiration of the lucent lesion in the left iliac bone.  No immediate complication. See radiology report.

## 2012-10-11 NOTE — ED Notes (Signed)
Requested bed from [facility], spoke with [facility] RN

## 2012-10-11 NOTE — Progress Notes (Signed)
Casey ALLRED,PA IN TO SEE AND PER Casey ALLRED,PA OK TO D/C HOME; CLIENT STATES DOES NOT WANT NEBULIZER TREATMENT

## 2012-10-13 ENCOUNTER — Other Ambulatory Visit: Payer: Self-pay | Admitting: Oncology

## 2012-10-14 ENCOUNTER — Telehealth: Payer: Self-pay | Admitting: Oncology

## 2012-10-16 ENCOUNTER — Other Ambulatory Visit: Payer: Self-pay | Admitting: Internal Medicine

## 2012-10-18 ENCOUNTER — Other Ambulatory Visit: Payer: Self-pay | Admitting: *Deleted

## 2012-10-18 ENCOUNTER — Ambulatory Visit (HOSPITAL_BASED_OUTPATIENT_CLINIC_OR_DEPARTMENT_OTHER): Payer: Medicaid Other | Admitting: Oncology

## 2012-10-18 VITALS — BP 142/88 | HR 86 | Temp 98.6°F | Resp 20 | Ht 69.0 in | Wt 213.7 lb

## 2012-10-18 DIAGNOSIS — B20 Human immunodeficiency virus [HIV] disease: Secondary | ICD-10-CM

## 2012-10-18 DIAGNOSIS — M899 Disorder of bone, unspecified: Secondary | ICD-10-CM

## 2012-10-18 DIAGNOSIS — C7951 Secondary malignant neoplasm of bone: Secondary | ICD-10-CM

## 2012-10-18 DIAGNOSIS — M949 Disorder of cartilage, unspecified: Secondary | ICD-10-CM

## 2012-10-18 DIAGNOSIS — M898X5 Other specified disorders of bone, thigh: Secondary | ICD-10-CM

## 2012-10-18 MED ORDER — DICLOFENAC SODIUM 1 % TD GEL: 2.0000 g | Freq: Two times a day (BID) | TRANSDERMAL | Status: DC | PRN

## 2012-10-18 NOTE — Progress Notes (Signed)
[facility]  [phone] [phone]   OFFICE PROGRESS NOTE   Cc:  No primary provider on file.  DIAGNOSIS:  Lytic bone lesion in right hip; negative biopsy.   CURRENT THERAPY:  Pending work up.   INTERVAL HISTORY: Casey Brennan 52 y.o. male returns for followup with his sister. He reports productive cough last few days. He was seen by her PCP and x-ray was negative. He denies hemoptysis, chest pain, dyspnea on exertion, pleurisy. He also has nasal congestion for which he is taking over-the-counter medication for. He denies low back pain and hip pain. He denies lower extremity weakness, paresthesia, bowel bladder incontinence. The rest of the 14 point visits and was negative.  Past Medical History  Diagnosis Date  . HIV (human immunodeficiency virus infection) 1988  . Pneumonia 07/12/10    SEPTIC SHOCK  . Severe frontal headaches 08/16/10  . Chest pain 07/12/10    pt states no hx of chest pain - 12/25/11  . Renal cyst   . Asthma   . Gastric ulcer   . Hematuria     negative w/u with Dr. Jory Sims.  Assumed passed stone.     Past Surgical History  Procedure Laterality Date  . Upper leg soft tissue biopsy      left thigh  . Knee arthroscopy      right  . Acne cyst removal    . Breast surgery      left retroareolar abscess i and d, biopsy  . Colonoscopy  12/26/2011    Procedure: COLONOSCOPY;  Surgeon: Shirley Friar, MD;  Location: [facility];  Service: Endoscopy;  Laterality: N/A;  . Esophagogastroduodenoscopy  12/26/2011    Procedure: ESOPHAGOGASTRODUODENOSCOPY (EGD);  Surgeon: Shirley Friar, MD;  Location: [facility];  Service: Endoscopy;  Laterality: N/A;    Current Outpatient Prescriptions  Medication Sig Dispense Refill  . albuterol (PROVENTIL HFA;VENTOLIN HFA) 108 (90 BASE) MCG/ACT inhaler Inhale 2 puffs into the lungs every 6 (six) hours as needed. For shortness of breath      . albuterol (PROVENTIL) (2.5 MG/3ML)  0.083% nebulizer solution Take 2.5 mg by nebulization every 6 (six) hours as needed. For shortness of breath      . budesonide-formoterol (SYMBICORT) 80-4.5 MCG/ACT inhaler Inhale 2 puffs into the lungs 2 (two) times daily.  1 Inhaler  11  . diclofenac sodium (VOLTAREN) 1 % GEL Apply 2 g topically 2 (two) times daily as needed. For pain  1 Tube  2  . emtricitabine-tenofovir (TRUVADA) 200-300 MG per tablet Take 1 tablet by mouth daily.  30 tablet  6  . gabapentin (NEURONTIN) 600 MG tablet Take 600 mg by mouth 4 (four) times daily.       . hydrOXYzine (VISTARIL) 25 MG capsule Take 25 mg by mouth 4 (four) times daily as needed. For itching      . ISENTRESS 400 MG tablet TAKE 1 TABLET BY MOUTH TWICE DAILY  60 tablet  0  . loratadine (CLARITIN) 10 MG tablet TAKE 1 TABLET BY MOUTH DAILY  30 tablet  0  . Multiple Vitamin (MULTIVITAMIN WITH MINERALS) TABS Take 1 tablet by mouth daily.      . mupirocin ointment (BACTROBAN) 2 % APPLY TOPICALLY TO THE AFFECTED AREA TWICE DAILY FOR 2 WEEKS. PLACE COTTON BETWEEN TOES TO ALLOW BETTER AERATION  66 g  0  . NORVIR 100 MG TABS TAKE 1 TABLET BY MOUTH DAILY WITH PREZISTA AND TRUVADA  30 tablet  0  . omeprazole (PRILOSEC) 40 MG capsule Take 40 mg by mouth daily.       [name] PREZISTA 800 MG tablet TAKE 1 TABLET BY MOUTH DAILY  30 tablet  0  . tizanidine (ZANAFLEX) 6 MG capsule Take 6 mg by mouth every 6 (six) hours.      . traZODone (DESYREL) 50 MG tablet Take 1 tablet (50 mg total) by mouth at bedtime.  30 tablet  3  . zolpidem (AMBIEN CR) 12.5 MG CR tablet Take 1 tablet (12.5 mg total) by mouth at bedtime as needed. For sleep  30 tablet  0  . folic acid (FOLVITE) 1 MG tablet Take 1 tablet (1 mg total) by mouth daily.  30 tablet  11  . [DISCONTINUED] famotidine (PEPCID AC) 10 MG tablet One po bid x 3 days and then prn upper abdominal pain  20 tablet  0   No current facility-administered medications for this visit.    ALLERGIES:  is allergic to penicillins and  sulfonamide derivatives.  REVIEW OF SYSTEMS:  The rest of the 14-point review of system was negative.   Filed Vitals:   10/18/12 0845  BP: 142/88  Pulse: 86  Temp: 98.6 F (37 C)  Resp: 20   Wt Readings from Last 3 Encounters:  10/18/12 213 lb 11.2 oz (96.934 kg)  10/11/12 205 lb (92.987 kg)  09/12/12 216 lb 8 oz (98.204 kg)   ECOG Performance status: 1  PHYSICAL EXAMINATION:   General:  well-nourished man, in no acute distress.  Eyes:  no scleral icterus.  ENT:  There were no oropharyngeal lesions.  Neck was without thyromegaly.  Lymphatics:  Negative cervical, supraclavicular or axillary adenopathy.  Respiratory: lungs were clear bilaterally without wheezing or crackles.  Cardiovascular:  Regular rate and rhythm, S1/S2, without murmur, rub or gallop.  There was no pedal edema.  GI:  abdomen was soft, flat, nontender, nondistended, without organomegaly.  Muscoloskeletal:  no spinal tenderness of palpation of vertebral spine.  Skin exam was without echymosis, petichae.  Neuro exam was nonfocal.  Patient was able to get on and off exam table without assistance.  Gait was normal.  Patient was alert and oriented.  Attention was good.   Language was appropriate.  Mood was normal without depression.  Speech was not pressured.  Thought content was not tangential.      LABORATORY/RADIOLOGY DATA:  Lab Results  Component Value Date   WBC 6.4 10/11/2012   HGB 9.5* 10/11/2012   HCT 27.7* 10/11/2012   PLT 175 10/11/2012   GLUCOSE 106* 08/22/2012   CHOL 176 08/22/2012   TRIG 112 08/22/2012   HDL 46 08/22/2012   LDLCALC 108* 08/22/2012   ALKPHOS 103 08/22/2012   ALT 12 08/22/2012   AST 18 08/22/2012   NA 136 08/22/2012   K 4.2 08/22/2012   CL 103 08/22/2012   CREATININE 1.23 08/22/2012   BUN 12 08/22/2012   CO2 25 08/22/2012   PSA 3.44 09/12/2012   INR 0.96 10/11/2012   HGBA1C  Value: 4.3 (NOTE)                                                                       According to the ADA Clinical  Practice Recommendations for 2011, when HbA1c is used as a screening test:   >=6.5%   Diagnostic of Diabetes Mellitus           (if abnormal result  is confirmed)  5.7-6.4%   Increased risk of developing Diabetes Mellitus  References:Diagnosis and Classification of Diabetes Mellitus,Diabetes Care,2011,34(Suppl 1):S62-S69 and Standards of Medical Care in         Diabetes - 2011,Diabetes Care,2011,34  (Suppl 1):S11-S61.* 10/10/2010   MICROALBUR 16.88* 10/05/2011    Ct Biopsy  10/11/2012  *RADIOLOGY REPORT*  Clinical history:52 year old with a suspicious lucent lesion in the left iliac crest.  PROCEDURE(S): CT GUIDED BONE LESION BIOPSY  Physician: Rachelle Hora. Henn, MD  Medications:Versed 2 mg, Fentanyl 100 mcg. A radiology nurse monitored the patient for moderate sedation.  Moderate sedation time:22 minutes  Fluoroscopy time: 13 seconds CT fluoroscopy  Procedure:The procedure was explained to the patient.  The risks and benefits of the procedure were discussed and the patient's questions were addressed.  Informed consent was obtained from the patient.  The patient was placed prone.  CT images of the pelvis were obtained.  The small lucent lesion in the left iliac crest was identified.  The left gluteal area was prepped and draped in a sterile fashion.  The skin and bone cortex were anesthetized with lidocaine.  An 11 gauge bone needle was directed into the lucent lesion with CT fluoroscopy.  Needle position was confirmed within the lesion.  Three aspirates were obtained with 20 gauge needles. Lesion was too small to obtain core samples.  A final aspirate was obtained through the 11 gauge bone needle.  Bone needle was removed without complication.  Findings:1 cm lucent lesion in the left iliac crest.  Needle position was confirmed within the lesion.  Complications: None  Impression:CT guided biopsy of the left iliac crest lesion.   Original Report Authenticated By: Richarda Overlie, M.D.    Dg Chest Port 1 View  10/11/2012   *RADIOLOGY REPORT*  Clinical Data: Shortness of breath.  Recent bone biopsy.  PORTABLE CHEST - 1 VIEW  Comparison: 04/12/2012  Findings: The patient is slightly rotated towards the right side. Lungs are clear without airspace disease or edema.  Heart size is stable.  Mediastinum is slightly prominent but probably associated with the patient rotation.  IMPRESSION: No active cardiopulmonary disease.   Original Report Authenticated By: Richarda Overlie, M.D.        ASSESSMENT AND PLAN:     1. Solitary lytic bone lesion. - Workup: Bone biopsy was negative for cancer. - To complete work up, I recommend PET scan to rule out an occult primary malignant process. If PET scan is negative, then recommend observation with followup CT pelvis in about 4 months.  Mr. Riling and his sister expressed informed understanding and agreed with the recommendation.    2.  HIV/AIDS:  He is on Truvada, Norvir per PCP.   3.  Follow up:  In about 4 months with followup CT pelvis but sooner if PET scan is positive.  The length of time of the face-to-face encounter was 15 minutes. More than 50% of time was spent counseling and coordination of care.

## 2012-10-18 NOTE — Patient Instructions (Addendum)
1. Issue: Solitary lytic bone lesion. 2. Workup: Bone biopsy was negative for any cancer. 3. Impression: Most likely benign process. 4. Recommendation: PET scan to rule out an occult primary malignant process. If PET scan is negative, then recommend observation and no further workup.

## 2012-10-21 ENCOUNTER — Telehealth: Payer: Self-pay | Admitting: Oncology

## 2012-10-31 ENCOUNTER — Other Ambulatory Visit: Payer: Self-pay | Admitting: Oncology

## 2012-10-31 ENCOUNTER — Encounter (HOSPITAL_COMMUNITY): Payer: Medicaid Other

## 2012-10-31 ENCOUNTER — Inpatient Hospital Stay (HOSPITAL_COMMUNITY)
Admission: EM | Admit: 2012-10-31 | Discharge: 2012-11-02 | DRG: 977 | Disposition: A | Discharge status: Home / Self Care | Payer: Medicaid Other | Attending: Internal Medicine | Admitting: Internal Medicine

## 2012-10-31 ENCOUNTER — Encounter (HOSPITAL_COMMUNITY): Payer: Self-pay | Admitting: Adult Health

## 2012-10-31 ENCOUNTER — Emergency Department (HOSPITAL_COMMUNITY): Payer: Medicaid Other

## 2012-10-31 DIAGNOSIS — R000 Tachycardia, unspecified: Secondary | ICD-10-CM

## 2012-10-31 DIAGNOSIS — R519 Headache, unspecified: Secondary | ICD-10-CM | POA: Diagnosis present

## 2012-10-31 DIAGNOSIS — R059 Cough, unspecified: Secondary | ICD-10-CM

## 2012-10-31 DIAGNOSIS — Z87891 Personal history of nicotine dependence: Secondary | ICD-10-CM

## 2012-10-31 DIAGNOSIS — B20 Human immunodeficiency virus [HIV] disease: Principal | ICD-10-CM

## 2012-10-31 DIAGNOSIS — R51 Headache: Secondary | ICD-10-CM

## 2012-10-31 DIAGNOSIS — F0630 Mood disorder due to known physiological condition, unspecified: Secondary | ICD-10-CM

## 2012-10-31 DIAGNOSIS — E861 Hypovolemia: Secondary | ICD-10-CM | POA: Diagnosis present

## 2012-10-31 DIAGNOSIS — R509 Fever, unspecified: Secondary | ICD-10-CM

## 2012-10-31 DIAGNOSIS — R05 Cough: Secondary | ICD-10-CM

## 2012-10-31 DIAGNOSIS — N179 Acute kidney failure, unspecified: Secondary | ICD-10-CM | POA: Diagnosis present

## 2012-10-31 DIAGNOSIS — E871 Hypo-osmolality and hyponatremia: Secondary | ICD-10-CM | POA: Diagnosis present

## 2012-10-31 DIAGNOSIS — J209 Acute bronchitis, unspecified: Secondary | ICD-10-CM | POA: Diagnosis present

## 2012-10-31 DIAGNOSIS — I10 Essential (primary) hypertension: Secondary | ICD-10-CM

## 2012-10-31 DIAGNOSIS — J019 Acute sinusitis, unspecified: Secondary | ICD-10-CM | POA: Diagnosis present

## 2012-10-31 DIAGNOSIS — J329 Chronic sinusitis, unspecified: Secondary | ICD-10-CM | POA: Diagnosis present

## 2012-10-31 DIAGNOSIS — J45909 Unspecified asthma, uncomplicated: Secondary | ICD-10-CM | POA: Diagnosis present

## 2012-10-31 DIAGNOSIS — A419 Sepsis, unspecified organism: Secondary | ICD-10-CM

## 2012-10-31 LAB — CBC WITH DIFFERENTIAL/PLATELET
Basophils Absolute: 0 10*3/uL (ref 0.0–0.1)
Basophils Relative: 0 % (ref 0–1)
Eosinophils Absolute: 0.1 10*3/uL (ref 0.0–0.7)
Eosinophils Relative: 1 % (ref 0–5)
HCT: 39.3 % (ref 39.0–52.0)
Hemoglobin: 13.7 g/dL (ref 13.0–17.0)
Lymphocytes Relative: 10 % — ABNORMAL LOW (ref 12–46)
Lymphs Abs: 1.2 10*3/uL (ref 0.7–4.0)
MCH: 31.1 pg (ref 26.0–34.0)
MCHC: 34.9 g/dL (ref 30.0–36.0)
MCV: 89.3 fL (ref 78.0–100.0)
Monocytes Absolute: 0.7 10*3/uL (ref 0.1–1.0)
Monocytes Relative: 6 % (ref 3–12)
Neutro Abs: 9.8 10*3/uL — ABNORMAL HIGH (ref 1.7–7.7)
Neutrophils Relative %: 83 % — ABNORMAL HIGH (ref 43–77)
Platelets: 201 10*3/uL (ref 150–400)
RBC: 4.4 MIL/uL (ref 4.22–5.81)
RDW: 14.6 % (ref 11.5–15.5)
WBC: 11.8 10*3/uL — ABNORMAL HIGH (ref 4.0–10.5)

## 2012-10-31 LAB — URINALYSIS, ROUTINE W REFLEX MICROSCOPIC
Bilirubin Urine: NEGATIVE
Glucose, UA: NEGATIVE mg/dL
Hgb urine dipstick: NEGATIVE
Ketones, ur: NEGATIVE mg/dL
Leukocytes, UA: NEGATIVE
Nitrite: NEGATIVE
Protein, ur: NEGATIVE mg/dL
Specific Gravity, Urine: 1.008 (ref 1.005–1.030)
Urobilinogen, UA: 0.2 mg/dL (ref 0.0–1.0)
pH: 6.5 (ref 5.0–8.0)

## 2012-10-31 LAB — COMPREHENSIVE METABOLIC PANEL
ALT: 14 U/L (ref 0–53)
AST: 18 U/L (ref 0–37)
Albumin: 4 g/dL (ref 3.5–5.2)
Alkaline Phosphatase: 114 U/L (ref 39–117)
BUN: 11 mg/dL (ref 6–23)
CO2: 27 mEq/L (ref 19–32)
Calcium: 9.1 mg/dL (ref 8.4–10.5)
Chloride: 98 mEq/L (ref 96–112)
Creatinine, Ser: 1.35 mg/dL (ref 0.50–1.35)
GFR calc Af Amer: 70 mL/min — ABNORMAL LOW (ref >90)
GFR calc non Af Amer: 60 mL/min — ABNORMAL LOW (ref >90)
Glucose, Bld: 104 mg/dL — ABNORMAL HIGH (ref 70–99)
Potassium: 3.7 mEq/L (ref 3.5–5.1)
Sodium: 134 mEq/L — ABNORMAL LOW (ref 135–145)
Total Bilirubin: 0.3 mg/dL (ref 0.3–1.2)
Total Protein: 7.9 g/dL (ref 6.0–8.3)

## 2012-10-31 LAB — LACTIC ACID, PLASMA: Lactic Acid, Venous: 1.2 mmol/L (ref 0.5–2.2)

## 2012-10-31 MED ORDER — ONDANSETRON HCL 8 MG PO TABS
4.0000 mg | ORAL_TABLET | Freq: Once | ORAL | Status: AC
  Administered 2012-11-01: via ORAL
  Filled 2012-10-31: qty 1

## 2012-10-31 MED ORDER — ACETAMINOPHEN 325 MG PO TABS
650.0000 mg | ORAL_TABLET | Freq: Four times a day (QID) | ORAL | Status: DC | PRN
  Administered 2012-10-31: 650 mg via ORAL
  Filled 2012-10-31: qty 2

## 2012-10-31 MED ORDER — DEXTROSE 5 % IV SOLN
1.0000 g | INTRAVENOUS | Status: AC
  Administered 2012-10-31: 1 g via INTRAVENOUS
  Filled 2012-10-31: qty 1

## 2012-10-31 MED ORDER — VANCOMYCIN HCL 10 G IV SOLR
2000.0000 mg | Freq: Once | INTRAVENOUS | Status: AC
  Administered 2012-11-01: 2000 mg via INTRAVENOUS
  Filled 2012-10-31: qty 2000

## 2012-10-31 MED ORDER — HYDROMORPHONE HCL PF 1 MG/ML IJ SOLN
1.0000 mg | Freq: Once | INTRAMUSCULAR | Status: AC
  Administered 2012-10-31: 1 mg via INTRAVENOUS
  Filled 2012-10-31: qty 1

## 2012-10-31 MED ORDER — SODIUM CHLORIDE 0.9 % IV BOLUS (SEPSIS)
1000.0000 mL | Freq: Once | INTRAVENOUS | Status: AC
  Administered 2012-10-31: 1000 mL via INTRAVENOUS

## 2012-10-31 NOTE — ED Notes (Signed)
Family at bedside. 

## 2012-10-31 NOTE — ED Notes (Signed)
MD at bedside. 

## 2012-10-31 NOTE — ED Notes (Addendum)
Presents to ER with sudden onset of chills, fever of 103.0, SOB, cough and chest pain . Cough is productive with yellow phlegm. Pt is tachycardiac and febrile 103.0, sats 92% RA. Alert, oriented.  HIv positive
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
## 2012-11-01 ENCOUNTER — Encounter (HOSPITAL_COMMUNITY): Payer: Self-pay | Admitting: *Deleted

## 2012-11-01 DIAGNOSIS — R51 Headache: Secondary | ICD-10-CM

## 2012-11-01 DIAGNOSIS — B20 Human immunodeficiency virus [HIV] disease: Principal | ICD-10-CM

## 2012-11-01 DIAGNOSIS — J45909 Unspecified asthma, uncomplicated: Secondary | ICD-10-CM

## 2012-11-01 DIAGNOSIS — F0630 Mood disorder due to known physiological condition, unspecified: Secondary | ICD-10-CM

## 2012-11-01 DIAGNOSIS — J329 Chronic sinusitis, unspecified: Secondary | ICD-10-CM | POA: Diagnosis present

## 2012-11-01 DIAGNOSIS — R509 Fever, unspecified: Secondary | ICD-10-CM

## 2012-11-01 DIAGNOSIS — A419 Sepsis, unspecified organism: Secondary | ICD-10-CM

## 2012-11-01 DIAGNOSIS — R059 Cough, unspecified: Secondary | ICD-10-CM

## 2012-11-01 DIAGNOSIS — R05 Cough: Secondary | ICD-10-CM

## 2012-11-01 DIAGNOSIS — J209 Acute bronchitis, unspecified: Secondary | ICD-10-CM

## 2012-11-01 LAB — BASIC METABOLIC PANEL
BUN: 13 mg/dL (ref 6–23)
CO2: 25 mEq/L (ref 19–32)
Calcium: 8.3 mg/dL — ABNORMAL LOW (ref 8.4–10.5)
Chloride: 102 mEq/L (ref 96–112)
Creatinine, Ser: 1.34 mg/dL (ref 0.50–1.35)
GFR calc Af Amer: 70 mL/min — ABNORMAL LOW (ref >90)
GFR calc non Af Amer: 61 mL/min — ABNORMAL LOW (ref >90)
Glucose, Bld: 105 mg/dL — ABNORMAL HIGH (ref 70–99)
Potassium: 3.7 mEq/L (ref 3.5–5.1)
Sodium: 134 mEq/L — ABNORMAL LOW (ref 135–145)

## 2012-11-01 LAB — EXPECTORATED SPUTUM ASSESSMENT W GRAM STAIN, RFLX TO RESP C

## 2012-11-01 LAB — T-HELPER CELLS (CD4) COUNT (NOT AT ARMC)
CD4 % Helper T Cell: 18 % — ABNORMAL LOW (ref 33–55)
CD4 T Cell Abs: 400 uL (ref 400–2700)

## 2012-11-01 LAB — PHOSPHORUS: Phosphorus: 3.2 mg/dL (ref 2.3–4.6)

## 2012-11-01 LAB — PNEUMOCYSTIS JIROVECI SMEAR BY DFA: Pneumocystis jiroveci Ag: NEGATIVE

## 2012-11-01 LAB — CBC
HCT: 34.1 % — ABNORMAL LOW (ref 39.0–52.0)
Hemoglobin: 11.8 g/dL — ABNORMAL LOW (ref 13.0–17.0)
MCH: 30.6 pg (ref 26.0–34.0)
MCHC: 34.6 g/dL (ref 30.0–36.0)
MCV: 88.6 fL (ref 78.0–100.0)
Platelets: 162 10*3/uL (ref 150–400)
RBC: 3.85 MIL/uL — ABNORMAL LOW (ref 4.22–5.81)
RDW: 15 % (ref 11.5–15.5)
WBC: 10 10*3/uL (ref 4.0–10.5)

## 2012-11-01 LAB — MAGNESIUM: Magnesium: 1.9 mg/dL (ref 1.5–2.5)

## 2012-11-01 LAB — INFLUENZA PANEL BY PCR (TYPE A & B)
H1N1 flu by pcr: NOT DETECTED
Influenza A By PCR: NEGATIVE
Influenza B By PCR: NEGATIVE

## 2012-11-01 LAB — EXPECTORATED SPUTUM ASSESSMENT W REFEX TO RESP CULTURE

## 2012-11-01 MED ORDER — GUAIFENESIN-DM 100-10 MG/5ML PO SYRP
5.0000 mL | ORAL_SOLUTION | ORAL | Status: DC | PRN
  Administered 2012-11-01: 5 mL via ORAL
  Filled 2012-11-01 (×3): qty 5

## 2012-11-01 MED ORDER — SODIUM CHLORIDE 0.9 % IV SOLN
INTRAVENOUS | Status: DC
  Administered 2012-11-01 – 2012-11-02 (×4): via INTRAVENOUS

## 2012-11-01 MED ORDER — VITAMIN D3 25 MCG (1000 UNIT) PO TABS
2000.0000 [IU] | ORAL_TABLET | Freq: Every day | ORAL | Status: DC
  Administered 2012-11-01 – 2012-11-02 (×2): 2000 [IU] via ORAL
  Filled 2012-11-01 (×2): qty 2

## 2012-11-01 MED ORDER — LORATADINE 10 MG PO TABS
10.0000 mg | ORAL_TABLET | Freq: Every day | ORAL | Status: DC
  Administered 2012-11-01 – 2012-11-02 (×2): 10 mg via ORAL
  Filled 2012-11-01 (×2): qty 1

## 2012-11-01 MED ORDER — ADULT MULTIVITAMIN W/MINERALS CH
1.0000 | ORAL_TABLET | Freq: Every day | ORAL | Status: DC
  Administered 2012-11-01 – 2012-11-02 (×2): 1 via ORAL
  Filled 2012-11-01 (×2): qty 1

## 2012-11-01 MED ORDER — EMTRICITABINE-TENOFOVIR DF 200-300 MG PO TABS
1.0000 | ORAL_TABLET | Freq: Every day | ORAL | Status: DC
  Administered 2012-11-01 – 2012-11-02 (×2): 1 via ORAL
  Filled 2012-11-01 (×2): qty 1

## 2012-11-01 MED ORDER — FOLIC ACID 1 MG PO TABS
1.0000 mg | ORAL_TABLET | Freq: Every day | ORAL | Status: DC
  Administered 2012-11-01 – 2012-11-02 (×2): 1 mg via ORAL
  Filled 2012-11-01 (×2): qty 1

## 2012-11-01 MED ORDER — HYDROXYZINE PAMOATE 25 MG PO CAPS: 25.0000 mg | ORAL_CAPSULE | Freq: Four times a day (QID) | ORAL | Status: DC | PRN

## 2012-11-01 MED ORDER — ACETAMINOPHEN 325 MG PO TABS: 650.0000 mg | ORAL_TABLET | Freq: Four times a day (QID) | ORAL | Status: DC | PRN

## 2012-11-01 MED ORDER — IPRATROPIUM BROMIDE 0.02 % IN SOLN
0.5000 mg | Freq: Four times a day (QID) | RESPIRATORY_TRACT | Status: DC
  Administered 2012-11-01 (×4): 0.5 mg via RESPIRATORY_TRACT
  Filled 2012-11-01 (×4): qty 2.5

## 2012-11-01 MED ORDER — NAPROXEN 250 MG PO TABS
250.0000 mg | ORAL_TABLET | Freq: Two times a day (BID) | ORAL | Status: DC | PRN
  Filled 2012-11-01: qty 1

## 2012-11-01 MED ORDER — GABAPENTIN 600 MG PO TABS
600.0000 mg | ORAL_TABLET | Freq: Three times a day (TID) | ORAL | Status: DC
  Administered 2012-11-01 – 2012-11-02 (×7): 600 mg via ORAL
  Filled 2012-11-01 (×10): qty 1

## 2012-11-01 MED ORDER — NAPROXEN SODIUM 220 MG PO TABS: 220.0000 mg | ORAL_TABLET | Freq: Two times a day (BID) | ORAL | Status: DC | PRN

## 2012-11-01 MED ORDER — TIZANIDINE HCL 2 MG PO TABS
6.0000 mg | ORAL_TABLET | Freq: Three times a day (TID) | ORAL | Status: DC
  Administered 2012-11-01 – 2012-11-02 (×7): 6 mg via ORAL
  Filled 2012-11-01 (×10): qty 3

## 2012-11-01 MED ORDER — DARUNAVIR ETHANOLATE 800 MG PO TABS
800.0000 mg | ORAL_TABLET | Freq: Every day | ORAL | Status: DC
  Administered 2012-11-01 – 2012-11-02 (×2): 800 mg via ORAL
  Filled 2012-11-01 (×3): qty 1

## 2012-11-01 MED ORDER — ACETAMINOPHEN 650 MG RE SUPP: 650.0000 mg | Freq: Four times a day (QID) | RECTAL | Status: DC | PRN

## 2012-11-01 MED ORDER — DEXTROSE 5 % IV SOLN
2.0000 g | Freq: Two times a day (BID) | INTRAVENOUS | Status: DC
  Filled 2012-11-01: qty 2

## 2012-11-01 MED ORDER — RITONAVIR 100 MG PO TABS
100.0000 mg | ORAL_TABLET | Freq: Every day | ORAL | Status: DC
  Administered 2012-11-01 – 2012-11-02 (×2): 100 mg via ORAL
  Filled 2012-11-01 (×3): qty 1

## 2012-11-01 MED ORDER — MUPIROCIN 2 % EX OINT
TOPICAL_OINTMENT | Freq: Every day | CUTANEOUS | Status: DC
  Administered 2012-11-01 – 2012-11-02 (×2): via TOPICAL
  Filled 2012-11-01: qty 22

## 2012-11-01 MED ORDER — HYDROCOD POLST-CHLORPHEN POLST 10-8 MG/5ML PO LQCR
5.0000 mL | Freq: Two times a day (BID) | ORAL | Status: DC | PRN
  Administered 2012-11-01 – 2012-11-02 (×3): 5 mL via ORAL
  Filled 2012-11-01 (×3): qty 5

## 2012-11-01 MED ORDER — RALTEGRAVIR POTASSIUM 400 MG PO TABS
400.0000 mg | ORAL_TABLET | Freq: Two times a day (BID) | ORAL | Status: DC
  Administered 2012-11-01 – 2012-11-02 (×4): 400 mg via ORAL
  Filled 2012-11-01 (×5): qty 1

## 2012-11-01 MED ORDER — PANTOPRAZOLE SODIUM 40 MG PO TBEC
40.0000 mg | DELAYED_RELEASE_TABLET | Freq: Every day | ORAL | Status: DC
  Administered 2012-11-01 – 2012-11-02 (×2): 40 mg via ORAL
  Filled 2012-11-01 (×2): qty 1

## 2012-11-01 MED ORDER — ZOLPIDEM TARTRATE 5 MG PO TABS: 5.0000 mg | ORAL_TABLET | Freq: Every evening | ORAL | Status: DC | PRN

## 2012-11-01 MED ORDER — TRAZODONE HCL 50 MG PO TABS
50.0000 mg | ORAL_TABLET | Freq: Every day | ORAL | Status: DC
  Administered 2012-11-01 (×2): 50 mg via ORAL
  Filled 2012-11-01 (×3): qty 1

## 2012-11-01 MED ORDER — LEVOFLOXACIN 750 MG PO TABS
750.0000 mg | ORAL_TABLET | Freq: Every day | ORAL | Status: DC
  Administered 2012-11-01 – 2012-11-02 (×2): 750 mg via ORAL
  Filled 2012-11-01 (×3): qty 1

## 2012-11-01 MED ORDER — LEVALBUTEROL HCL 0.63 MG/3ML IN NEBU
0.6300 mg | INHALATION_SOLUTION | Freq: Three times a day (TID) | RESPIRATORY_TRACT | Status: DC
  Administered 2012-11-02: 0.63 mg via RESPIRATORY_TRACT
  Filled 2012-11-01 (×4): qty 3

## 2012-11-01 MED ORDER — HYDROXYZINE HCL 25 MG PO TABS
25.0000 mg | ORAL_TABLET | Freq: Four times a day (QID) | ORAL | Status: DC | PRN
  Filled 2012-11-01: qty 1

## 2012-11-01 MED ORDER — HEPARIN SODIUM (PORCINE) 5000 UNIT/ML IJ SOLN
5000.0000 [IU] | Freq: Three times a day (TID) | INTRAMUSCULAR | Status: DC
  Administered 2012-11-01 – 2012-11-02 (×3): 5000 [IU] via SUBCUTANEOUS
  Filled 2012-11-01 (×6): qty 1

## 2012-11-01 MED ORDER — MORPHINE SULFATE 2 MG/ML IJ SOLN
2.0000 mg | INTRAMUSCULAR | Status: DC | PRN
  Administered 2012-11-01 – 2012-11-02 (×2): 2 mg via INTRAVENOUS
  Filled 2012-11-01 (×2): qty 1

## 2012-11-01 MED ORDER — LEVALBUTEROL HCL 0.63 MG/3ML IN NEBU
0.6300 mg | INHALATION_SOLUTION | Freq: Four times a day (QID) | RESPIRATORY_TRACT | Status: DC
  Administered 2012-11-01 (×4): 0.63 mg via RESPIRATORY_TRACT
  Filled 2012-11-01 (×6): qty 3

## 2012-11-01 MED ORDER — IPRATROPIUM BROMIDE 0.02 % IN SOLN
0.5000 mg | Freq: Three times a day (TID) | RESPIRATORY_TRACT | Status: DC
  Administered 2012-11-02: 0.5 mg via RESPIRATORY_TRACT
  Filled 2012-11-01 (×2): qty 2.5

## 2012-11-01 NOTE — H&P (Signed)
Hospital Admission Note Date: 11/01/2012  Patient name: Casey Brennan Medical record number: [id number] Date of birth: 1961/06/30 Age: 52 y.o. Gender: male PCP: Johny Sax, MD   Service:  Internal Medicine Teaching Service   Attending Physician:  Dr. Criselda Peaches    Chief Complaint:  Fever, malaise, congestion    History of Present Illness:  52 year old man with HIV. Symptom onset was 2 weeks ago with muscle aches, malaise, and sinonasal congestion. Symptoms have been steady to slightly worsening until today. He was at church today and all of a sudden felt terrible with whole body aches, fevers, and chills. For one week, he has had a mild cough that is occasionally of clear to yellow sputum. Mild dyspnea. Nausea for a few days but no vomiting. No diarrhea or constipation. No rashes.     Review of Systems:   Constitutional: Positive for fever, chills, malaise/fatigue and diaphoresis.  HENT: Positive for ear pain (Left), congestion, neck pain (chronic) and ear discharge (Left, clear).   Eyes: Negative for blurred vision.  Respiratory: Positive for cough, sputum production and shortness of breath.   Cardiovascular: Positive for chest pain (with cough).  Gastrointestinal: Positive for nausea. Negative for vomiting, diarrhea and constipation.  Genitourinary: Positive for dysuria (pain upon initiation).  Musculoskeletal: Positive for myalgias.  Skin: Negative for itching and rash.  Neurological: Positive for headaches.     Medical History: Past Medical History  Diagnosis Date  . HIV (human immunodeficiency virus infection) 1988  . Pneumonia 07/12/10    SEPTIC SHOCK  . Severe frontal headaches 08/16/10  . Chest pain 07/12/10    pt states no hx of chest pain - 12/25/11  . Renal cyst   . Asthma   . Gastric ulcer   . Hematuria     negative w/u with Dr. Jory Sims.  Assumed passed stone.     Surgical History: Past Surgical History  Procedure Laterality Date  . Upper leg soft  tissue biopsy      left thigh  . Knee arthroscopy      right  . Acne cyst removal    . Breast surgery      left retroareolar abscess i and d, biopsy  . Colonoscopy  12/26/2011    Procedure: COLONOSCOPY;  Surgeon: Shirley Friar, MD;  Location: [facility];  Service: Endoscopy;  Laterality: N/A;  . Esophagogastroduodenoscopy  12/26/2011    Procedure: ESOPHAGOGASTRODUODENOSCOPY (EGD);  Surgeon: Shirley Friar, MD;  Location: [facility];  Service: Endoscopy;  Laterality: N/A;    Home Medications: 1. Albuterol MDI 2 puffs every 6 hours as needed for shortness of breath 2. Nebulized albuterol every 6 hours as needed for shortness of breath 3. Budesonide-formoterol 80-4.5 two puffs twice a day 4. Cholecalciferol 2000 units daily 5. Cyanocobalamin 1000 mcg IM monthly 6. Diclofenac gel topical twice a day for pain 7. Emtricitabine-tenofovir 200-300 mg daily 8. Folic acid 1 mg daily 9. Gabapentin 600 mg 4 times a day 10. Hydroxyzine 25 mg 4 times a day when necessary for itching 11. Raltegravir 400 mg twice a day 12. Loratadine 10 mg daily 13. Daily multivitamin 14. Naproxen 220 mg twice a day as needed for pain 15. Ritonavir 100 mg daily 16. Omeprazole 40 mg daily 17. Darunavir 800 mg daily 18. Tizanidine 6 mg every 6 hours 19. Trazodone 50 mg at bedtime 20. Zolpidem 12.5 mg at bedtime as needed for sleep   Allergies: Allergies as of 10/31/2012 - Review Complete 10/31/2012  Allergen Reaction Noted  .  Penicillins Shortness Of Breath and Rash   . Sulfonamide derivatives Shortness Of Breath and Rash     Family History: Family History  Problem Relation Age of Onset  . Stroke Father   . Diabetes Father   . Cancer Father     brain cancer  . Hypertension Sister   . Cancer Maternal Uncle     prostate cancer    Social History: History   Social History  . Marital Status: Legally Separated    Spouse Name: N/A    Number of Children: N/A  . 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 Smoking status: Former Smoker    Types: Cigars    Quit date: 05/22/2010  . Smokeless tobacco: Never Used  . Alcohol Use: No     Comment: rare  . Drug Use: No  . Sexually Active: Not Currently    Birth Control/ Protection: Condom     Comment: pt. declined condoms   Other Topics Concern  . Not on file   Social History Narrative   Sister April stays with him currently although he hopes to return to truck driving. 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 Sister is starting medical school next year.     Physical exam: VITALS: BP 135/86, HR 112, RR 22, temp 10 65F, SpO2 92% on nasal cannula oxygen GENERAL: well developed, well nourished; distressed and sick appearing HEAD: atraumatic, normocephalic, tenderness with palpation of the maxillary sinuses bilaterally, no tenderness of the frontal or ethmoid sinuses EYES: pupils equal, round and reactive; sclera and conjunctiva injected EARS: canals patent, right TM normal appearing, left TM scarred but noninfected NOSE/THROAT: oropharynx clear; moist mucous membranes; pink gums; normal dentition; nasal turbinates inflamed, swollen, and erythematous bilaterally NECK: tenderness with palpation of the C-spine, tenderness with active and passive flexion of the neck, no neck pain with extension of the knee with the thigh flexed; thyroid normal size with no palpable nodules; no rigidity of the neck LYMPH: no cervical or supraclavicular lymphadenopathy LUNGS: coarse breath sounds and restricted chest wall movement HEART: tachycardia and regular rhythm; normal S1 and S2 without S3 or S4; no murmurs, rubs, or clicks PULSES: radial and dorsalis pedis pulses are 2+ and symmetric ABDOMEN: soft, non-tender, normal bowel sounds, no masses or organomegaly SKIN: hot, dry, intact, normal turgor, no rashes EXTREMITIES: no peripheral edema, clubbing, or  cyanosis PSYCH: patient is alert and oriented      Lab results: Basic Metabolic Panel:  40/98/11 2106  NA 134*  K 3.7  CL 98  CO2 27  GLUCOSE 104*  BUN 11  CREATININE 1.35  CALCIUM 9.1    Liver Function Tests:  10/31/12 2106  AST 18  ALT 14  ALKPHOS 114  BILITOT 0.3  PROT 7.9  ALBUMIN 4.0    CBC Component Value Date/Time   WBC 11.8* 10/31/2012 2106   RBC 4.40 10/31/2012 2106   HGB 13.7 10/31/2012 2106   HCT 39.3 10/31/2012 2106   PLT 201 10/31/2012 2106   MCV 89.3 10/31/2012 2106   MCH 31.1 10/31/2012 2106   MCHC 34.9 10/31/2012 2106   RDW 14.6 10/31/2012 2106   PMNABS 9.8* 10/31/2012 2106   LYMPHSABS 1.2 10/31/2012 2106   MONOABS 0.7 10/31/2012 2106   EOSABS 0.1 10/31/2012 2106   BASOSABS 0.0 10/31/2012 2106    Urinalysis:  10/31/12 2343  COLORURINE YELLOW  LABSPEC 1.008  PHURINE 6.5  GLUCOSEU NEGATIVE  HGBUR NEGATIVE  BILIRUBINUR NEGATIVE  KETONESUR NEGATIVE  PROTEINUR NEGATIVE  UROBILINOGEN 0.2  NITRITE NEGATIVE  LEUKOCYTESUR NEGATIVE    Imaging results: Ct Head Wo Contrast 10/31/2012  FINDINGS: Diffuse cerebral atrophy.  No ventricular dilatation.  No mass effect or midline shift.  No abnormal extra-axial fluid collections.  Gray-white matter junctions are distinct.  Basal cisterns are not effaced.  No evidence of acute intracranial hemorrhage.  There are air-fluid levels in both maxillary antra with opacification of some of the ethmoid air cells.  This may correspond to acute sinusitis. Mastoid air cells are not opacified. IMPRESSION: No acute intracranial abnormalities.  Air-fluid levels in both maxillary antra with opacification of ethmoid air cells suggesting sinusitis.  Dg Chest Port 1 View 10/31/2012 FINDINGS: The heart size and pulmonary vascularity are normal. The lungs appear clear and expanded without focal air space disease or consolidation. No blunting of the costophrenic angles.  No pneumothorax.  Mediastinal contours appear intact.  No significant change  since previous study.   IMPRESSION: No evidence of active pulmonary disease.    Other results: EKG Results:  10/31/2012 Rate:  115 PR:  154 QRS:  80 QTc:  434 Axis:  Normal EKG: nonspecific ST and T waves changes, sinus tachycardia.   Assessment and Plan:  1.   Acute sinusitis and bronchitis:  Blood cultures pending. We will order flu PCR. At this stage the disease course, good chance for bacterial infection or superinfection. We will cover the usual pathogens (Streptococcus pneumoniae, Moraxella catarrhalis, and Haemophilus influenza) with levofloxacin.  No evidence of pneumonia on chest x-ray. Although he does have pain in his neck, it is consistent with prior C-spine injury and not meningismus. - Blood cultures - Levofloxacin - Influenza PCR  2.   Hyponatremia:  Hypovolemia related hyponatremia from increased insensible losses and decreased oral intake. We are hydrating with normal saline.   3.   Acute kidney injury:  Most likely prerenal given his increased insensible losses and decreased oral intake. Hydrating with intravenous normal saline.  4.   HIV:  Last CD4 count was 3:30 in July. We will continue his home regimen and recheck CD4 count. Quantitative HIV RNA load was 40 in July.  If his CD4 count remains normal, PCP pneumonia is essentially ruled out. - Recheck CD4 count and viral load  5.   Prophylaxis:  6.   Disposition:  [facility] patient.  Expected length of stay > 2 nights.    Signed by:  Dorthula Rue. Earlene Plater, MD PGY-I, Internal Medicine  11/01/2012, 1:25 AM

## 2012-11-01 NOTE — Progress Notes (Signed)
ANTIBIOTIC CONSULT NOTE - INITIAL  Pharmacy Consult for cefepime Indication: Empiric therapy  Allergies  Allergen Reactions  . Penicillins Shortness Of Breath and Rash  . Sulfonamide Derivatives Shortness Of Breath and Rash    Patient Measurements: Height: 5\' 9"  (175.3 cm) Weight: 213 lb 10 oz (96.9 kg) (Per 10/18/12 documentation) IBW/kg (Calculated) : 70.7  Vital Signs: Temp: 101.5 F (38.6 C) (05/01 2232) Temp src: Oral (05/01 2232) BP: 135/86 mmHg (05/01 2104) Pulse Rate: 112 (05/01 2104) Intake/Output from previous day: 05/01 0701 - 05/02 0700 In: -  Out: 350 [Urine:350] Intake/Output from this shift: Total I/O In: -  Out: 350 [Urine:350]  Labs:  Recent Labs  10/31/12 2106  WBC 11.8*  HGB 13.7  PLT 201  CREATININE 1.35   Estimated Creatinine Clearance: 76 ml/min (by C-G formula based on Cr of 1.35). No results found for this basename: VANCOTROUGH, VANCOPEAK, VANCORANDOM, GENTTROUGH, GENTPEAK, GENTRANDOM, TOBRATROUGH, TOBRAPEAK, TOBRARND, AMIKACINPEAK, AMIKACINTROU, AMIKACIN,  in the last 72 hours   Microbiology: No results found for this or any previous visit (from the past 720 hour(s)).  Medical History: Past Medical History  Diagnosis Date  . HIV (human immunodeficiency virus infection) 1988  . Pneumonia 07/12/10    SEPTIC SHOCK  . Severe frontal headaches 08/16/10  . Chest pain 07/12/10    pt states no hx of chest pain - 12/25/11  . Renal cyst   . Asthma   . Gastric ulcer   . Hematuria     negative w/u with Dr. Jory Sims.  Assumed passed stone.     Medications:  Scheduled:  . [COMPLETED]  HYDROmorphone (DILAUDID) injection  1 mg Intravenous Once  . [COMPLETED] ondansetron  4 mg Oral Once   Assessment: 52 yo male with h/o HIV presented with fevers for past week. Pharmacy to manage cefepime. Patient with penicillin allergy (rash, shortness of breath), but patient received cefepime in ED without any adverse effects per RN.   Plan:  1. Cefepime  2gm IV Q12H.   Emeline Gins 11/01/2012,1:01 AM

## 2012-11-01 NOTE — Consult Note (Signed)
INFECTIOUS DISEASE CONSULT NOTE  Date of Admission:  10/31/2012  Date of Consult:  11/01/2012  Reason for Consult: HIV+, sinusitis Referring Physician: Mullen/Sadek  Impression/Recommendation HIV+ Sinusitis  Would- continue levaquin Await BCx Await HIV RNA, CD4 (400)  Comment- so far from the data, all that I can conclusively rest on is that he has sinusitis. He does not have si/sx to suggest meningitis.  As far as his lytic lesion goes, will defer further w/u til he gets repeat CT in August.   My great appreciation to the [facility] and their outstanding care.   Thank you so much for this interesting consult,   Johny Sax [phone]  Casey Brennan is an 52 y.o. male.  HPI: 52 yo M with hx of HIV+, CKD stage II (resolved?), HTN, previously cervical fracture 2013. In 2013 he was also worked up for a lytic abnormality on his CT of the pelvis. This workup was negative and he is to have repeat CT in 6 months (August 2014).  He comes to hospital on 11-01-12 with 2 weeks of worsening malaise, muscle aches, cough/congestion with clear/yellow sputum.  He has also developed headaches, L ear pain and d/c, chills and rigors.  On 10-31-12 he developed sudden onset of chills, fever and body aches and came to hospital. WBC nl, CXR negative, sinus films+ for ethmoid sinusitis.  Home ART- ISN/ATVr/TRV.   HIV 1 RNA Quant (copies/mL)  Date Value  01/19/2012 40*  10/05/2011 70*  09/27/2011 <20      CD4 T Cell Abs (cmm)  Date Value  11/01/2012 400   01/19/2012 330*  10/05/2011 250*     Past Medical History  Diagnosis Date  . HIV (human immunodeficiency virus infection) 1988  . Pneumonia 07/12/10    SEPTIC SHOCK  . Severe frontal headaches 08/16/10  . Chest pain 07/12/10    pt states no hx of chest pain - 12/25/11  . Renal cyst   . Asthma   . Gastric ulcer   . Hematuria     negative w/u with Dr. Jory Sims.  Assumed passed stone.     Past Surgical History  Procedure Laterality Date  .  Upper leg soft tissue biopsy      left thigh  . Knee arthroscopy      right  . Acne cyst removal    . Breast surgery      left retroareolar abscess i and d, biopsy  . Colonoscopy  12/26/2011    Procedure: COLONOSCOPY;  Surgeon: Shirley Friar, MD;  Location: [facility];  Service: Endoscopy;  Laterality: N/A;  . Esophagogastroduodenoscopy  12/26/2011    Procedure: ESOPHAGOGASTRODUODENOSCOPY (EGD);  Surgeon: Shirley Friar, MD;  Location: [facility];  Service: Endoscopy;  Laterality: N/A;     Allergies  Allergen Reactions  . Penicillins Shortness Of Breath and Rash  . Sulfonamide Derivatives Shortness Of Breath and Rash    Medications:  Scheduled: . cholecalciferol  2,000 Units Oral Daily  . Darunavir Ethanolate  800 mg Oral Q breakfast  . emtricitabine-tenofovir  1 tablet Oral Daily  . folic acid  1 mg Oral Daily  . gabapentin  600 mg Oral TID AC & HS  . heparin  5,000 Units Subcutaneous Q8H  . levalbuterol  0.63 mg Nebulization Q6H   And  . ipratropium  0.5 mg Nebulization Q6H  . levofloxacin  750 mg Oral Daily  . loratadine  10 mg Oral Daily  . multivitamin with minerals  1 tablet Oral Daily  .  mupirocin ointment   Topical Daily  . pantoprazole  40 mg Oral Daily  . raltegravir  400 mg Oral BID  . ritonavir  100 mg Oral Q breakfast  . tiZANidine  6 mg Oral TID AC & HS  . traZODone  50 mg Oral QHS    Total days of antibiotics 1 (levaquin)          Social History:  reports that he quit smoking about 2 years ago. His smoking use included Cigars. He has never used smokeless tobacco. He reports that he does not drink alcohol or use illicit drugs.  Family History  Problem Relation Age of Onset  . Stroke Father   . Diabetes Father   . Cancer Father     brain cancer  . Hypertension Sister   . Cancer Maternal Uncle     prostate cancer    General ROS: no BM for 48 h, no sob, some neck pain/unchanged from his previous cervical fracture. no photophobia. no  oral ulcers. see HPI  Blood pressure 107/69, pulse 80, temperature 98.8 F (37.1 C), temperature source Oral, resp. rate 16, height 5\' 9"  (1.753 m), weight 93.895 kg (207 lb), SpO2 98.00%. General appearance: alert, cooperative and fatigued Eyes: negative findings: pupils equal, round, reactive to light and accomodation and no photophobia Throat: lips, mucosa, and tongue normal; teeth and gums normal Neck: no adenopathy, supple, symmetrical, trachea midline and minimal pain with anterior flexion, similar to previous Lungs: clear to auscultation bilaterally Heart: regular rate and rhythm Abdomen: normal findings: bowel sounds normal and soft, non-tender Extremities: edema none and Homans sign is negative, no sign of DVT   Results for orders placed during the hospital encounter of 10/31/12 (from the past 48 hour(s))  CBC WITH DIFFERENTIAL     Status: Abnormal   Collection Time    10/31/12  9:06 PM      Result Value Range   WBC 11.8 (*) 4.0 - 10.5 K/uL   RBC 4.40  4.22 - 5.81 MIL/uL   Hemoglobin 13.7  13.0 - 17.0 g/dL   HCT 16.1  09.6 - 04.5 %   MCV 89.3  78.0 - 100.0 fL   MCH 31.1  26.0 - 34.0 pg   MCHC 34.9  30.0 - 36.0 g/dL   RDW 40.9  81.1 - 91.4 %   Platelets 201  150 - 400 K/uL   Neutrophils Relative 83 (*) 43 - 77 %   Neutro Abs 9.8 (*) 1.7 - 7.7 K/uL   Lymphocytes Relative 10 (*) 12 - 46 %   Lymphs Abs 1.2  0.7 - 4.0 K/uL   Monocytes Relative 6  3 - 12 %   Monocytes Absolute 0.7  0.1 - 1.0 K/uL   Eosinophils Relative 1  0 - 5 %   Eosinophils Absolute 0.1  0.0 - 0.7 K/uL   Basophils Relative 0  0 - 1 %   Basophils Absolute 0.0  0.0 - 0.1 K/uL  COMPREHENSIVE METABOLIC PANEL     Status: Abnormal   Collection Time    10/31/12  9:06 PM      Result Value Range   Sodium 134 (*) 135 - 145 mEq/L   Potassium 3.7  3.5 - 5.1 mEq/L   Chloride 98  96 - 112 mEq/L   CO2 27  19 - 32 mEq/L   Glucose, Bld 104 (*) 70 - 99 mg/dL   BUN 11  6 - 23 mg/dL   Creatinine, Ser 7.82  0.50 -  1.35 mg/dL   Calcium 9.1  8.4 - 16.1 mg/dL   Total Protein 7.9  6.0 - 8.3 g/dL   Albumin 4.0  3.5 - 5.2 g/dL   AST 18  0 - 37 U/L   ALT 14  0 - 53 U/L   Alkaline Phosphatase 114  39 - 117 U/L   Total Bilirubin 0.3  0.3 - 1.2 mg/dL   GFR calc non Af Amer 60 (*) >90 mL/min   GFR calc Af Amer 70 (*) >90 mL/min   Comment:            The eGFR has been calculated     using the CKD EPI equation.     This calculation has not been     validated in all clinical     situations.     eGFR's persistently     <90 mL/min signify     possible Chronic Kidney Disease.  LACTIC ACID, PLASMA     Status: None   Collection Time    10/31/12 10:13 PM      Result Value Range   Lactic Acid, Venous 1.2  0.5 - 2.2 mmol/L  URINALYSIS, ROUTINE W REFLEX MICROSCOPIC     Status: None   Collection Time    10/31/12 11:43 PM      Result Value Range   Color, Urine YELLOW  YELLOW   APPearance CLEAR  CLEAR   Specific Gravity, Urine 1.008  1.005 - 1.030   pH 6.5  5.0 - 8.0   Glucose, UA NEGATIVE  NEGATIVE mg/dL   Hgb urine dipstick NEGATIVE  NEGATIVE   Bilirubin Urine NEGATIVE  NEGATIVE   Ketones, ur NEGATIVE  NEGATIVE mg/dL   Protein, ur NEGATIVE  NEGATIVE mg/dL   Urobilinogen, UA 0.2  0.0 - 1.0 mg/dL   Nitrite NEGATIVE  NEGATIVE   Leukocytes, UA NEGATIVE  NEGATIVE   Comment: MICROSCOPIC NOT DONE ON URINES WITH NEGATIVE PROTEIN, BLOOD, LEUKOCYTES, NITRITE, OR GLUCOSE <1000 mg/dL.  MAGNESIUM     Status: None   Collection Time    11/01/12  5:34 AM      Result Value Range   Magnesium 1.9  1.5 - 2.5 mg/dL  PHOSPHORUS     Status: None   Collection Time    11/01/12  5:34 AM      Result Value Range   Phosphorus 3.2  2.3 - 4.6 mg/dL  T-HELPER CELLS (CD4) COUNT     Status: Abnormal   Collection Time    11/01/12  5:34 AM      Result Value Range   CD4 T Cell Abs 400  400 - 2700 cmm   CD4 % Helper T Cell 18 (*) 33 - 55 %  BASIC METABOLIC PANEL     Status: Abnormal   Collection Time    11/01/12  5:34 AM       Result Value Range   Sodium 134 (*) 135 - 145 mEq/L   Potassium 3.7  3.5 - 5.1 mEq/L   Chloride 102  96 - 112 mEq/L   CO2 25  19 - 32 mEq/L   Glucose, Bld 105 (*) 70 - 99 mg/dL   BUN 13  6 - 23 mg/dL   Creatinine, Ser 0.96  0.50 - 1.35 mg/dL   Calcium 8.3 (*) 8.4 - 10.5 mg/dL   GFR calc non Af Amer 61 (*) >90 mL/min   GFR calc Af Amer 70 (*) >90 mL/min   Comment:  The eGFR has been calculated     using the CKD EPI equation.     This calculation has not been     validated in all clinical     situations.     eGFR's persistently     <90 mL/min signify     possible Chronic Kidney Disease.  CBC     Status: Abnormal   Collection Time    11/01/12  5:34 AM      Result Value Range   WBC 10.0  4.0 - 10.5 K/uL   RBC 3.85 (*) 4.22 - 5.81 MIL/uL   Hemoglobin 11.8 (*) 13.0 - 17.0 g/dL   HCT 16.1 (*) 09.6 - 04.5 %   MCV 88.6  78.0 - 100.0 fL   MCH 30.6  26.0 - 34.0 pg   MCHC 34.6  30.0 - 36.0 g/dL   RDW 40.9  81.1 - 91.4 %   Platelets 162  150 - 400 K/uL  INFLUENZA PANEL BY PCR     Status: None   Collection Time    11/01/12 10:12 AM      Result Value Range   Influenza A By PCR NEGATIVE  NEGATIVE   Influenza B By PCR NEGATIVE  NEGATIVE   H1N1 flu by pcr NOT DETECTED  NOT DETECTED   Comment:            The Xpert Flu assay (FDA approved for     nasal aspirates or washes and     nasopharyngeal swab specimens), is     intended as an aid in the diagnosis of     influenza and should not be used as     a sole basis for treatment.  CULTURE, EXPECTORATED SPUTUM-ASSESSMENT     Status: None   Collection Time    11/01/12 10:16 AM      Result Value Range   Specimen Description SPUTUM     Special Requests NONE     Sputum evaluation       Value: THIS SPECIMEN IS ACCEPTABLE. RESPIRATORY CULTURE REPORT TO FOLLOW.   Report Status 11/01/2012 FINAL        Component Value Date/Time   SDES SPUTUM 11/01/2012 1016   SPECREQUEST NONE 11/01/2012 1016   CULT NO GROWTH 5 DAYS 11/29/2011 1932    REPTSTATUS 11/01/2012 FINAL 11/01/2012 1016   Ct Head Wo Contrast  10/31/2012  *RADIOLOGY REPORT*  Clinical Data: Sudden onset of fevers.  CT HEAD WITHOUT CONTRAST  Technique:  Contiguous axial images were obtained from the base of the skull through the vertex without contrast.  Comparison: 03/16/2012  Findings: Diffuse cerebral atrophy.  No ventricular dilatation.  No mass effect or midline shift.  No abnormal extra-axial fluid collections.  Gray-white matter junctions are distinct.  Basal cisterns are not effaced.  No evidence of acute intracranial hemorrhage.  There are air-fluid levels in both maxillary antra with opacification of some of the ethmoid air cells.  This may correspond to acute sinusitis. Mastoid air cells are not opacified.  IMPRESSION: No acute intracranial abnormalities.  Air-fluid levels in both maxillary antra with opacification of ethmoid air cells suggesting sinusitis.   Original Report Authenticated By: Burman Nieves, M.D.    Dg Chest Port 1 View  10/31/2012  *RADIOLOGY REPORT*  Clinical Data: Chest pain with shortness of breath and weakness. Pain all over the body.  PORTABLE CHEST - 1 VIEW  Comparison: 10/11/2012  Findings: The heart size and pulmonary vascularity are normal. The lungs appear clear and  expanded without focal air space disease or consolidation. No blunting of the costophrenic angles.  No pneumothorax.  Mediastinal contours appear intact.  No significant change since previous study.  IMPRESSION: No evidence of active pulmonary disease.   Original Report Authenticated By: Burman Nieves, M.D.    Recent Results (from the past 240 hour(s))  CULTURE, EXPECTORATED SPUTUM-ASSESSMENT     Status: None   Collection Time    11/01/12 10:16 AM      Result Value Range Status   Specimen Description SPUTUM   Final   Special Requests NONE   Final   Sputum evaluation     Final   Value: THIS SPECIMEN IS ACCEPTABLE. RESPIRATORY CULTURE REPORT TO FOLLOW.   Report Status  11/01/2012 FINAL   Final      11/01/2012, 2:33 PM     LOS: 1 day

## 2012-11-01 NOTE — H&P (Signed)
Internal Medicine Teaching Service Attending Note Date: 11/01/2012  Patient name: Casey Brennan  Medical record number: [id number]  Date of birth: 02/23/1961   I have seen and evaluated Leafy Half and discussed their care with the Residency Team.    Mr. Winski is a 52yo man with PMH of HIV and asthma who presented to the hospital with about a 2 week episodes of malaise, muscle aches and congestion.  He also reports a cough for about a week which was improving.  Over the past week, he has had 2 episodes of waking up sweating, but no documented fever at home.  He further reports recent chills, rigors, dyspnea, nausea, sputum production and drainage from the left ear.  Further associated includes sinus pressure and headache which originates from the sinuses and radiates to the back of the head.  He has a history of HIV for which he takes HAART and his most recent CD4 count was > 300.  He denies vomiting, diarrhea, constipation, rash.  He has chronic neck pain from a previous accident, however, no neck stiffness.    He is further positive for chest pain which is worse with coughing and pain upon initiation of his stream of urine.    For further medical history, meds, allergies and ROS, please see resident note.  He reports compliance with his HAART therapy.   Physical Exam: Blood pressure 107/69, pulse 80, temperature 98.8 F (37.1 C), temperature source Oral, resp. rate 16, height 5\' 9"  (1.753 m), weight 207 lb (93.895 kg), SpO2 98.00%. General appearance: alert, cooperative and appears stated age Head: Normocephalic, without obvious abnormality, atraumatic, + TTP over maxillary, frontal sinuses bilaterally Eyes: EOMI, anicteric sclerae Throat: oropharynx clear, MMM, no exudate noted on tonsils.   Lungs: course breath sounds throughout, but good air movement, no wheezing Heart: RR, NR, no murmur noted Abdomen: soft, NT, ND, +BS Extremities: thin, no edema Pulses: 2+ and symmetric Skin: +  warm, no LAD, no rash noted.   Lab results: Results for orders placed during the hospital encounter of 10/31/12 (from the past 24 hour(s))  CBC WITH DIFFERENTIAL     Status: Abnormal   Collection Time    10/31/12  9:06 PM      Result Value Range   WBC 11.8 (*) 4.0 - 10.5 K/uL   RBC 4.40  4.22 - 5.81 MIL/uL   Hemoglobin 13.7  13.0 - 17.0 g/dL   HCT 52.8  41.3 - 24.4 %   MCV 89.3  78.0 - 100.0 fL   MCH 31.1  26.0 - 34.0 pg   MCHC 34.9  30.0 - 36.0 g/dL   RDW 01.0  27.2 - 53.6 %   Platelets 201  150 - 400 K/uL   Neutrophils Relative 83 (*) 43 - 77 %   Neutro Abs 9.8 (*) 1.7 - 7.7 K/uL   Lymphocytes Relative 10 (*) 12 - 46 %   Lymphs Abs 1.2  0.7 - 4.0 K/uL   Monocytes Relative 6  3 - 12 %   Monocytes Absolute 0.7  0.1 - 1.0 K/uL   Eosinophils Relative 1  0 - 5 %   Eosinophils Absolute 0.1  0.0 - 0.7 K/uL   Basophils Relative 0  0 - 1 %   Basophils Absolute 0.0  0.0 - 0.1 K/uL  COMPREHENSIVE METABOLIC PANEL     Status: Abnormal   Collection Time    10/31/12  9:06 PM      Result Value Range  Sodium 134 (*) 135 - 145 mEq/L   Potassium 3.7  3.5 - 5.1 mEq/L   Chloride 98  96 - 112 mEq/L   CO2 27  19 - 32 mEq/L   Glucose, Bld 104 (*) 70 - 99 mg/dL   BUN 11  6 - 23 mg/dL   Creatinine, Ser 1.61  0.50 - 1.35 mg/dL   Calcium 9.1  8.4 - 09.6 mg/dL   Total Protein 7.9  6.0 - 8.3 g/dL   Albumin 4.0  3.5 - 5.2 g/dL   AST 18  0 - 37 U/L   ALT 14  0 - 53 U/L   Alkaline Phosphatase 114  39 - 117 U/L   Total Bilirubin 0.3  0.3 - 1.2 mg/dL   GFR calc non Af Amer 60 (*) >90 mL/min   GFR calc Af Amer 70 (*) >90 mL/min  LACTIC ACID, PLASMA     Status: None   Collection Time    10/31/12 10:13 PM      Result Value Range   Lactic Acid, Venous 1.2  0.5 - 2.2 mmol/L  URINALYSIS, ROUTINE W REFLEX MICROSCOPIC     Status: None   Collection Time    10/31/12 11:43 PM      Result Value Range   Color, Urine YELLOW  YELLOW   APPearance CLEAR  CLEAR   Specific Gravity, Urine 1.008  1.005 - 1.030    pH 6.5  5.0 - 8.0   Glucose, UA NEGATIVE  NEGATIVE mg/dL   Hgb urine dipstick NEGATIVE  NEGATIVE   Bilirubin Urine NEGATIVE  NEGATIVE   Ketones, ur NEGATIVE  NEGATIVE mg/dL   Protein, ur NEGATIVE  NEGATIVE mg/dL   Urobilinogen, UA 0.2  0.0 - 1.0 mg/dL   Nitrite NEGATIVE  NEGATIVE   Leukocytes, UA NEGATIVE  NEGATIVE  MAGNESIUM     Status: None   Collection Time    11/01/12  5:34 AM      Result Value Range   Magnesium 1.9  1.5 - 2.5 mg/dL  PHOSPHORUS     Status: None   Collection Time    11/01/12  5:34 AM      Result Value Range   Phosphorus 3.2  2.3 - 4.6 mg/dL  BASIC METABOLIC PANEL     Status: Abnormal   Collection Time    11/01/12  5:34 AM      Result Value Range   Sodium 134 (*) 135 - 145 mEq/L   Potassium 3.7  3.5 - 5.1 mEq/L   Chloride 102  96 - 112 mEq/L   CO2 25  19 - 32 mEq/L   Glucose, Bld 105 (*) 70 - 99 mg/dL   BUN 13  6 - 23 mg/dL   Creatinine, Ser 0.45  0.50 - 1.35 mg/dL   Calcium 8.3 (*) 8.4 - 10.5 mg/dL   GFR calc non Af Amer 61 (*) >90 mL/min   GFR calc Af Amer 70 (*) >90 mL/min  CBC     Status: Abnormal   Collection Time    11/01/12  5:34 AM      Result Value Range   WBC 10.0  4.0 - 10.5 K/uL   RBC 3.85 (*) 4.22 - 5.81 MIL/uL   Hemoglobin 11.8 (*) 13.0 - 17.0 g/dL   HCT 40.9 (*) 81.1 - 91.4 %   MCV 88.6  78.0 - 100.0 fL   MCH 30.6  26.0 - 34.0 pg   MCHC 34.6  30.0 - 36.0 g/dL  RDW 15.0  11.5 - 15.5 %   Platelets 162  150 - 400 K/uL  CULTURE, EXPECTORATED SPUTUM-ASSESSMENT     Status: None   Collection Time    11/01/12 10:16 AM      Result Value Range   Specimen Description SPUTUM     Special Requests NONE     Sputum evaluation       Value: THIS SPECIMEN IS ACCEPTABLE. RESPIRATORY CULTURE REPORT TO FOLLOW.   Report Status 11/01/2012 FINAL      Imaging results:  Ct Head Wo Contrast  10/31/2012  *RADIOLOGY REPORT*  Clinical Data: Sudden onset of fevers.  CT HEAD WITHOUT CONTRAST  Technique:  Contiguous axial images were obtained from the  base of the skull through the vertex without contrast.  Comparison: 03/16/2012  Findings: Diffuse cerebral atrophy.  No ventricular dilatation.  No mass effect or midline shift.  No abnormal extra-axial fluid collections.  Gray-white matter junctions are distinct.  Basal cisterns are not effaced.  No evidence of acute intracranial hemorrhage.  There are air-fluid levels in both maxillary antra with opacification of some of the ethmoid air cells.  This may correspond to acute sinusitis. Mastoid air cells are not opacified.  IMPRESSION: No acute intracranial abnormalities.  Air-fluid levels in both maxillary antra with opacification of ethmoid air cells suggesting sinusitis.   Original Report Authenticated By: Burman Nieves, M.D.    Dg Chest Port 1 View  10/31/2012  *RADIOLOGY REPORT*  Clinical Data: Chest pain with shortness of breath and weakness. Pain all over the body.  PORTABLE CHEST - 1 VIEW  Comparison: 10/11/2012  Findings: The heart size and pulmonary vascularity are normal. The lungs appear clear and expanded without focal air space disease or consolidation. No blunting of the costophrenic angles.  No pneumothorax.  Mediastinal contours appear intact.  No significant change since previous study.  IMPRESSION: No evidence of active pulmonary disease.   Original Report Authenticated By: Burman Nieves, M.D.     Assessment and Plan: I agree with the formulated Assessment and Plan with the following changes:   1. Acute sinusitis/bronchitis - BC and Sputum Cx are pending - Started Levaquin - Flu PCR due to subjective fever and aches - He does not have menigismus or documented fever.  He does have headache, but this seems to be mostly related to his sinuses.  He also has neck pain, but this is chronic in nature.  - CXR as noted above - CT head suggesting sinusitis  2. H/O HIV - Recent Labs with low viral load and CD4 count > 300 - Recheck CD4 and viral load  3. CKD - At baseline, some mild  hyponatremia which we will monitor  4. Mild hyponatremia - Rehydrate with NS, monitor closely for any change  Other issues per resident note.     Inez Catalina, MD 5/2/201412:30 PM

## 2012-11-01 NOTE — ED Provider Notes (Signed)
52 year old male with HIV disease has been running fevers for the past week. Symptoms have also included a cough productive of sputum which is occasionally yellow. There's been no vomiting or diarrhea and no urinary symptoms. He denies arthralgias and myalgias. He is reported to have low CD4 counts but this is not able to be verified by review of records. ED workup is negative except for mild leukocytosis. Given his immune compromised condition, it is elected to admit him on broad-spectrum antibiotics.  I saw and evaluated the patient, reviewed the resident's note and I agree with the findings and plan.   Dione Booze, MD 11/01/12 [phone]

## 2012-11-01 NOTE — Progress Notes (Signed)
Utilization Review Completed.   Josue Kass, RN, BSN Nurse Case Manager  [phone]  

## 2012-11-02 DIAGNOSIS — I10 Essential (primary) hypertension: Secondary | ICD-10-CM

## 2012-11-02 LAB — URINE CULTURE
Colony Count: NO GROWTH
Culture: NO GROWTH

## 2012-11-02 LAB — CBC
HCT: 35.8 % — ABNORMAL LOW (ref 39.0–52.0)
Hemoglobin: 12.2 g/dL — ABNORMAL LOW (ref 13.0–17.0)
MCH: 30.6 pg (ref 26.0–34.0)
MCHC: 34.1 g/dL (ref 30.0–36.0)
MCV: 89.7 fL (ref 78.0–100.0)
Platelets: 163 10*3/uL (ref 150–400)
RBC: 3.99 MIL/uL — ABNORMAL LOW (ref 4.22–5.81)
RDW: 14.6 % (ref 11.5–15.5)
WBC: 4.9 10*3/uL (ref 4.0–10.5)

## 2012-11-02 LAB — BASIC METABOLIC PANEL
BUN: 12 mg/dL (ref 6–23)
CO2: 26 mEq/L (ref 19–32)
Calcium: 8.6 mg/dL (ref 8.4–10.5)
Chloride: 106 mEq/L (ref 96–112)
Creatinine, Ser: 1.28 mg/dL (ref 0.50–1.35)
GFR calc Af Amer: 74 mL/min — ABNORMAL LOW (ref >90)
GFR calc non Af Amer: 64 mL/min — ABNORMAL LOW (ref >90)
Glucose, Bld: 103 mg/dL — ABNORMAL HIGH (ref 70–99)
Potassium: 4.1 mEq/L (ref 3.5–5.1)
Sodium: 139 mEq/L (ref 135–145)

## 2012-11-02 MED ORDER — LEVALBUTEROL TARTRATE 45 MCG/ACT IN AERO: 1.0000 | INHALATION_SPRAY | RESPIRATORY_TRACT | Status: DC | PRN

## 2012-11-02 MED ORDER — LEVOFLOXACIN 500 MG PO TABS: 500.0000 mg | ORAL_TABLET | Freq: Every day | ORAL | Status: DC

## 2012-11-02 MED ORDER — GUAIFENESIN-DM 100-10 MG/5ML PO SYRP: 5.0000 mL | ORAL_SOLUTION | ORAL | Status: DC | PRN

## 2012-11-02 MED ORDER — IPRATROPIUM BROMIDE 0.02 % IN SOLN: 0.5000 mg | Freq: Three times a day (TID) | RESPIRATORY_TRACT | Status: DC

## 2012-11-02 MED ORDER — LEVALBUTEROL HCL 0.63 MG/3ML IN NEBU: 0.6300 mg | INHALATION_SOLUTION | Freq: Three times a day (TID) | RESPIRATORY_TRACT | Status: DC

## 2012-11-02 NOTE — Progress Notes (Signed)
Subjective: Pt states that he is feeling much better since admission. His cough and SOB have improved, and his sinus pain has almost resolved. He denies any chest pain.   Objective: Vital signs in last 24 hours: Filed Vitals:   11/01/12 1854 11/01/12 2059 11/01/12 2200 11/02/12 0440  BP: 107/68  122/70 107/65  Pulse: 77  83 69  Temp: 98.7 F (37.1 C)  99 F (37.2 C) 98.7 F (37.1 C)  TempSrc: Oral  Oral Oral  Resp: 16  16 16   Height:      Weight:   216 lb 11.4 oz (98.3 kg)   SpO2: 98% 96% 97% 96%   Weight change: 3 lb 1.4 oz (1.4 kg) No intake or output data in the 24 hours ending 11/02/12 0733 Vitals reviewed. General: Comfortably resting in bed, NAD HEENT: PERRL, EOMI, no scleral icterus Cardiac: RRR, no rubs, murmurs or gallops Pulm: clear to auscultation bilaterally, no wheezes, rales, or rhonchi, diminished at the bases Abd: Soft, nontender, nondistended, BS present, large ventral hernia  Ext: Warm and well perfused, no pedal edema Neuro: Alert and oriented X3, nonfocal  Lab Results: Basic Metabolic Panel:  Recent Labs Lab 10/31/12 2106 11/01/12 0534  NA 134* 134*  K 3.7 3.7  CL 98 102  CO2 27 25  GLUCOSE 104* 105*  BUN 11 13  CREATININE 1.35 1.34  CALCIUM 9.1 8.3*  MG  --  1.9  PHOS  --  3.2   Liver Function Tests:  Recent Labs Lab 10/31/12 2106  AST 18  ALT 14  ALKPHOS 114  BILITOT 0.3  PROT 7.9  ALBUMIN 4.0   CBC:  Recent Labs Lab 10/31/12 2106 11/01/12 0534  WBC 11.8* 10.0  NEUTROABS 9.8*  --   HGB 13.7 11.8*  HCT 39.3 34.1*  MCV 89.3 88.6  PLT 201 162   Urine Drug Screen: Drugs of Abuse     Component Value Date/Time   LABOPIA NEG 10/05/2011 1045   LABOPIA POSITIVE* 10/10/2010 1502   COCAINSCRNUR NEG 10/05/2011 1045   COCAINSCRNUR NONE DETECTED 10/10/2010 1502   LABBENZ NEG 10/05/2011 1045   LABBENZ NONE DETECTED 10/10/2010 1502   AMPHETMU NONE DETECTED 10/10/2010 1502   THCU NONE DETECTED 10/10/2010 1502   LABBARB NEG 10/05/2011 1045    LABBARB  Value: NONE DETECTED        DRUG SCREEN FOR MEDICAL PURPOSES ONLY.  IF CONFIRMATION IS NEEDED FOR ANY PURPOSE, NOTIFY LAB WITHIN 5 DAYS.        LOWEST DETECTABLE LIMITS FOR URINE DRUG SCREEN Drug Class       Cutoff (ng/mL) Amphetamine      1000 Barbiturate      200 Benzodiazepine   200 Tricyclics       300 Opiates          300 Cocaine          300 THC              50 10/10/2010 1502    Urinalysis:  Recent Labs Lab 10/31/12 2343  COLORURINE YELLOW  LABSPEC 1.008  PHURINE 6.5  GLUCOSEU NEGATIVE  HGBUR NEGATIVE  BILIRUBINUR NEGATIVE  KETONESUR NEGATIVE  PROTEINUR NEGATIVE  UROBILINOGEN 0.2  NITRITE NEGATIVE  LEUKOCYTESUR NEGATIVE   Misc. Labs: 10/31/12: CD4 400 HIV Viral load, pending  Micro Results: Recent Results (from the past 240 hour(s))  URINE CULTURE     Status: None   Collection Time    10/31/12 11:43 PM  Result Value Range Status   Specimen Description URINE, RANDOM   Final   Special Requests NONE   Final   Culture  Setup Time 11/01/2012 04:59   Final   Colony Count NO GROWTH   Final   Culture NO GROWTH   Final   Report Status 11/02/2012 FINAL   Final  PNEUMOCYSTIS JIROVECI SMEAR BY DFA     Status: None   Collection Time    11/01/12 10:15 AM      Result Value Range Status   Specimen Source-PJSRC SPUTUM   Final   Pneumocystis jiroveci Ag NEGATIVE   Final   Comment: Performed at [facility]  CULTURE, EXPECTORATED SPUTUM-ASSESSMENT     Status: None   Collection Time    11/01/12 10:16 AM      Result Value Range Status   Specimen Description SPUTUM   Final   Special Requests NONE   Final   Sputum evaluation     Final   Value: THIS SPECIMEN IS ACCEPTABLE. RESPIRATORY CULTURE REPORT TO FOLLOW.   Report Status 11/01/2012 FINAL   Final   Studies/Results: Ct Head Wo Contrast  10/31/2012  *RADIOLOGY REPORT*  Clinical Data: Sudden onset of fevers.  CT HEAD WITHOUT CONTRAST  Technique:  Contiguous axial images were obtained from the base of the  skull through the vertex without contrast.  Comparison: 03/16/2012  Findings: Diffuse cerebral atrophy.  No ventricular dilatation.  No mass effect or midline shift.  No abnormal extra-axial fluid collections.  Gray-white matter junctions are distinct.  Basal cisterns are not effaced.  No evidence of acute intracranial hemorrhage.  There are air-fluid levels in both maxillary antra with opacification of some of the ethmoid air cells.  This may correspond to acute sinusitis. Mastoid air cells are not opacified.  IMPRESSION: No acute intracranial abnormalities.  Air-fluid levels in both maxillary antra with opacification of ethmoid air cells suggesting sinusitis.   Original Report Authenticated By: Burman Nieves, M.D.    Dg Chest Port 1 View  10/31/2012  *RADIOLOGY REPORT*  Clinical Data: Chest pain with shortness of breath and weakness. Pain all over the body.  PORTABLE CHEST - 1 VIEW  Comparison: 10/11/2012  Findings: The heart size and pulmonary vascularity are normal. The lungs appear clear and expanded without focal air space disease or consolidation. No blunting of the costophrenic angles.  No pneumothorax.  Mediastinal contours appear intact.  No significant change since previous study.  IMPRESSION: No evidence of active pulmonary disease.   Original Report Authenticated By: Burman Nieves, M.D.    Medications: I have reviewed the patient's current medications. Scheduled Meds: . cholecalciferol  2,000 Units Oral Daily  . Darunavir Ethanolate  800 mg Oral Q breakfast  . emtricitabine-tenofovir  1 tablet Oral Daily  . folic acid  1 mg Oral Daily  . gabapentin  600 mg Oral TID AC & HS  . heparin  5,000 Units Subcutaneous Q8H  . ipratropium  0.5 mg Nebulization TID  . levalbuterol  0.63 mg Nebulization TID  . levofloxacin  750 mg Oral Daily  . loratadine  10 mg Oral Daily  . multivitamin with minerals  1 tablet Oral Daily  . mupirocin ointment   Topical Daily  . pantoprazole  40 mg Oral Daily   . raltegravir  400 mg Oral BID  . ritonavir  100 mg Oral Q breakfast  . tiZANidine  6 mg Oral TID AC & HS  . traZODone  50 mg Oral  QHS   Continuous Infusions: . sodium chloride 125 mL/hr at 11/02/12 0419   PRN Meds:.acetaminophen, acetaminophen, chlorpheniramine-HYDROcodone, guaiFENesin-dextromethorphan, hydrOXYzine, morphine injection, naproxen, zolpidem  Assessment/Plan:  1. Acute sinusitis and bronchitis: Sx onset 2 weeks ago with muscle aches, malaise, and sinonasal congestion. For one week, he has had a mild cough that was occasionally productive with clear to yellow sputum and mild dyspnea. On the day of admission, he was at church and suddenly felt terrible with whole body aches, fevers, and chills. On presentation to the ED, he was started on broad spectrum abx due to concerns for sepsis given HIV history. Blood and respiratory cultures were ordered and are pending, and influenza PCR was ordered as well, which was negative. No evidence of pneumonia on chest x-ray. PCP sputum culture is negative. Although he does have pain in his neck, it is consistent with prior C-spine injury and not meningitis. His overall symptoms are most consistent with sinusitis that seems to be more bacterial in nature, so he was covered for the usual pathogens (Streptococcus pneumoniae, Moraxella catarrhalis, and Haemophilus influenza) with levofloxacin only. - F/u blood cultures  - Levofloxacin, day #2/7  2. Hyponatremia: Hypovolemia related hyponatremia from increased insensible losses and decreased oral intake. We are hydrating with normal saline. Na today is improved to 139.  3. Acute kidney injury: Most likely prerenal given his increased insensible losses and decreased oral intake. Hydrating with intravenous normal saline. Cr and GFR slowly improving, Cr 1.28 with GFR 74 today.  4. HIV: Last CD4 count was 330 with quantitative HIV RNA load 40 in July.  His home regimen was continue on admission.  CD4 count  and HIV VL were rechecked this hospitalization and CD4 is 400, viral load pending. Given his normal CD4, PCP is unlikely. Dr. Ninetta Lights is on board, appreciate recommendations.  - F/u viral load   5. DVT Prophylaxis: Heparin  Dispo: Disposition is deferred at this time, awaiting improvement of current medical problems.  Anticipated discharge in approximately 0-2 day(s).   The patient does have a current PCP Johny Sax, MD), therefore will not be requiring OPC follow-up after discharge.   The patient does not have transportation limitations that hinder transportation to clinic appointments.  .Services Needed at time of discharge: Y = Yes, Blank = No PT:   OT:   RN:   Equipment:   Other:     LOS: 2 days   Genelle Gather 11/02/2012, 7:33 AM

## 2012-11-02 NOTE — Discharge Summary (Signed)
Internal Medicine Teaching [facility] Discharge Note  Name: Casey Brennan MRN: [id number] DOB: 1960/10/21 52 y.o.  Date of Admission: 10/31/2012  9:17 PM Date of Discharge: 11/02/2012 Attending Physician: Inez Catalina, MD  Discharge Diagnosis: Principal Problem:   Bronchitis, acute Active Problems:   HIV INFECTION   Mood disorder in conditions classified elsewhere   HEADACHE, CHRONIC   Acute kidney injury   Asthma   Sinusitis  Discharge Medications:   Medication List    STOP taking these medications       albuterol (2.5 MG/3ML) 0.083% nebulizer solution  Commonly known as:  PROVENTIL     albuterol 108 (90 BASE) MCG/ACT inhaler  Commonly known as:  PROVENTIL HFA;VENTOLIN HFA      TAKE these medications       budesonide-formoterol 80-4.5 MCG/ACT inhaler  Commonly known as:  SYMBICORT  Inhale 2 puffs into the lungs 2 (two) times daily.     Cholecalciferol 2000 UNITS Tabs  Take 1 tablet by mouth daily.     cyanocobalamin 1000 MCG/ML injection  Commonly known as:  (VITAMIN B-12)  Inject 1,000 mcg into the muscle every 30 (thirty) days.     diclofenac sodium 1 % Gel  Commonly known as:  VOLTAREN  Apply 2 g topically 2 (two) times daily as needed. For pain     emtricitabine-tenofovir 200-300 MG per tablet  Commonly known as:  TRUVADA  Take 1 tablet by mouth daily.     folic acid 1 MG tablet  Commonly known as:  FOLVITE  Take 1 mg by mouth daily.     gabapentin 600 MG tablet  Commonly known as:  NEURONTIN  Take 600 mg by mouth 4 (four) times daily.     guaiFENesin-dextromethorphan 100-10 MG/5ML syrup  Commonly known as:  ROBITUSSIN DM  Take 5 mLs by mouth every 4 (four) hours as needed for cough.     hydrOXYzine 25 MG capsule  Commonly known as:  VISTARIL  Take 25 mg by mouth 4 (four) times daily as needed. For itching     ipratropium 0.02 % nebulizer solution  Commonly known as:  ATROVENT  Take 2.5 mLs (0.5 mg total) by nebulization 3 (three)  times daily.     ISENTRESS 400 MG tablet  Generic drug:  raltegravir  TAKE 1 TABLET BY MOUTH TWICE DAILY     levalbuterol 0.63 MG/3ML nebulizer solution  Commonly known as:  XOPENEX  Take 3 mLs (0.63 mg total) by nebulization 3 (three) times daily.     levalbuterol 45 MCG/ACT inhaler  Commonly known as:  XOPENEX HFA  Inhale 1-2 puffs into the lungs every 4 (four) hours as needed for wheezing.     levofloxacin 500 MG tablet  Commonly known as:  LEVAQUIN  Take 1 tablet (500 mg total) by mouth daily.     loratadine 10 MG tablet  Commonly known as:  CLARITIN  TAKE 1 TABLET BY MOUTH DAILY     multivitamin with minerals Tabs  Take 1 tablet by mouth daily.     mupirocin ointment 2 %  Commonly known as:  BACTROBAN  APPLY TOPICALLY TO THE AFFECTED AREA TWICE DAILY FOR 2 WEEKS. PLACE COTTON BETWEEN TOES TO ALLOW BETTER AERATION     naproxen sodium 220 MG tablet  Commonly known as:  ANAPROX  Take 220 mg by mouth 2 (two) times daily as needed (pain).     NORVIR 100 MG Tabs  Generic drug:  ritonavir  TAKE 1 TABLET  BY MOUTH DAILY WITH PREZISTA AND TRUVADA     omeprazole 40 MG capsule  Commonly known as:  PRILOSEC  Take 40 mg by mouth daily.     PREZISTA 800 MG tablet  Generic drug:  Darunavir Ethanolate  TAKE 1 TABLET BY MOUTH DAILY     tizanidine 6 MG capsule  Commonly known as:  ZANAFLEX  Take 6 mg by mouth every 6 (six) hours.     traZODone 50 MG tablet  Commonly known as:  DESYREL  Take 1 tablet (50 mg total) by mouth at bedtime.     zolpidem 12.5 MG CR tablet  Commonly known as:  AMBIEN CR  Take 1 tablet (12.5 mg total) by mouth at bedtime as needed. For sleep        Disposition and follow-up:   Casey Brennan was discharged from [facility] in Stable condition.  At the hospital follow up visit please address  - His sodium levels and Cr by checking a BMP - Evaluate for resolution of sinus/respiratory symptoms  Follow-up  Appointments: Follow-up Information   Follow up with Johny Sax, MD In 2 weeks. (Please call Dr. Moshe Cipro office to schedule your appointment)    Contact information:   [address]  [address] [place] [address] [phone]      Discharge Orders   Future Appointments Provider Department Dept Phone   11/13/2012 9:45 AM [facility] [phone]   11/27/2012 9:30 AM Ginnie Smart, MD [facility] for Infectious Disease [phone]   02/17/2013 7:30 AM [facility] [phone]   Patient to arrive 15 minutes prior to appointment time. Patient to pick up oral contrast at least 1 day prior to exam, unless otherwise instructed by your physician. No solid food 4 hours prior to exam. Liquids and Medicines are okay.   02/18/2013 2:30 PM Nilda Riggs, NP [facility] [phone]   02/19/2013 10:15 AM Mauri Brooklyn [facility] MEDICAL ONCOLOGY [phone]   02/19/2013 10:45 AM Myrtis Ser, NP [facility] MEDICAL ONCOLOGY [phone]   02/24/2013 8:30 AM Mauri Brooklyn [facility] MEDICAL ONCOLOGY [phone]   02/24/2013 9:00 AM Exie Parody, MD [facility] MEDICAL ONCOLOGY [phone]   Future Orders Complete By Expires     Activity as tolerated - No restrictions  As directed     Call MD for:  difficulty breathing, headache or visual disturbances  As directed     Call MD for:  persistant dizziness or light-headedness  As directed     Call MD for:  temperature >100.4  As directed     Diet general  As directed        Consultations:  [facility], Dr. Ninetta Lights  Procedures Performed:  Ct Head Wo Contrast  10/31/2012  *RADIOLOGY REPORT*  Clinical Data: Sudden onset of fevers.  CT HEAD WITHOUT CONTRAST  Technique:  Contiguous axial images were obtained from the base of the skull  through the vertex without contrast.  Comparison: 03/16/2012  Findings: Diffuse cerebral atrophy.  No ventricular dilatation.  No mass effect or midline shift.  No abnormal extra-axial fluid collections.  Gray-white matter junctions are distinct.  Basal cisterns are not effaced.  No evidence of acute intracranial hemorrhage.  There are air-fluid levels in both maxillary antra with opacification of some of the ethmoid air cells.  This may correspond to acute sinusitis. Mastoid air cells are not opacified.  IMPRESSION: No acute intracranial abnormalities.  Air-fluid levels in both maxillary antra with opacification of ethmoid air cells suggesting sinusitis.   Original Report Authenticated By: Burman Nieves, M.D.    Ct Biopsy  10/11/2012  *RADIOLOGY REPORT*  Clinical history:52 year old with a suspicious lucent lesion in the left iliac crest.  PROCEDURE(S): CT GUIDED BONE LESION BIOPSY  Physician: Rachelle Hora. Henn, MD  Medications:Versed 2 mg, Fentanyl 100 mcg. A radiology nurse monitored the patient for moderate sedation.  Moderate sedation time:22 minutes  Fluoroscopy time: 13 seconds CT fluoroscopy  Procedure:The procedure was explained to the patient.  The risks and benefits of the procedure were discussed and the patient's questions were addressed.  Informed consent was obtained from the patient.  The patient was placed prone.  CT images of the pelvis were obtained.  The small lucent lesion in the left iliac crest was identified.  The left gluteal area was prepped and draped in a sterile fashion.  The skin and bone cortex were anesthetized with lidocaine.  An 11 gauge bone needle was directed into the lucent lesion with CT fluoroscopy.  Needle position was confirmed within the lesion.  Three aspirates were obtained with 20 gauge needles. Lesion was too small to obtain core samples.  A final aspirate was obtained through the 11 gauge bone needle.  Bone needle was removed without complication.  Findings:1 cm lucent  lesion in the left iliac crest.  Needle position was confirmed within the lesion.  Complications: None  Impression:CT guided biopsy of the left iliac crest lesion.   Original Report Authenticated By: Richarda Overlie, M.D.    Dg Chest Port 1 View  10/31/2012  *RADIOLOGY REPORT*  Clinical Data: Chest pain with shortness of breath and weakness. Pain all over the body.  PORTABLE CHEST - 1 VIEW  Comparison: 10/11/2012  Findings: The heart size and pulmonary vascularity are normal. The lungs appear clear and expanded without focal air space disease or consolidation. No blunting of the costophrenic angles.  No pneumothorax.  Mediastinal contours appear intact.  No significant change since previous study.  IMPRESSION: No evidence of active pulmonary disease.   Original Report Authenticated By: Burman Nieves, M.D.    Dg Chest Port 1 View  10/11/2012  *RADIOLOGY REPORT*  Clinical Data: Shortness of breath.  Recent bone biopsy.  PORTABLE CHEST - 1 VIEW  Comparison: 04/12/2012  Findings: The patient is slightly rotated towards the right side. Lungs are clear without airspace disease or edema.  Heart size is stable.  Mediastinum is slightly prominent but probably associated with the patient rotation.  IMPRESSION: No active cardiopulmonary disease.   Original Report Authenticated By: Richarda Overlie, M.D.    Admission History of Present Illness: 52 year old man with HIV. Symptom onset was 2 weeks ago with muscle aches, malaise, and sinonasal congestion. Symptoms have been steady to slightly worsening until today. He was at church today and all of a sudden felt terrible with whole body aches, fevers, and chills. For one week, he has had a mild cough that is occasionally of clear to yellow sputum. Mild dyspnea. Nausea for a few days but no vomiting. No diarrhea or constipation. No rashes.    Review of Systems:  Constitutional: Positive for fever, chills, malaise/fatigue and diaphoresis.  HENT: Positive for ear pain (Left),  congestion, neck pain (chronic) and ear discharge (Left, clear).  Eyes: Negative for blurred vision.  Respiratory: Positive for cough, sputum production  and shortness of breath.  Cardiovascular: Positive for chest pain (with cough).  Gastrointestinal: Positive for nausea. Negative for vomiting, diarrhea and constipation.  Genitourinary: Positive for dysuria (pain upon initiation).  Musculoskeletal: Positive for myalgias.  Skin: Negative for itching and rash.  Neurological: Positive for headaches.   Physical exam:  VITALS: BP 135/86, HR 112, RR 22, temp 10 18F, SpO2 92% on nasal cannula oxygen  GENERAL: well developed, well nourished; distressed and sick appearing  HEAD: atraumatic, normocephalic, tenderness with palpation of the maxillary sinuses bilaterally, no tenderness of the frontal or ethmoid sinuses  EYES: pupils equal, round and reactive; sclera and conjunctiva injected  EARS: canals patent, right TM normal appearing, left TM scarred but noninfected  NOSE/THROAT: oropharynx clear; moist mucous membranes; pink gums; normal dentition; nasal turbinates inflamed, swollen, and erythematous bilaterally  NECK: tenderness with palpation of the C-spine, tenderness with active and passive flexion of the neck, no neck pain with extension of the knee with the thigh flexed; thyroid normal size with no palpable nodules; no rigidity of the neck  LYMPH: no cervical or supraclavicular lymphadenopathy  LUNGS: coarse breath sounds and restricted chest wall movement  HEART: tachycardia and regular rhythm; normal S1 and S2 without S3 or S4; no murmurs, rubs, or clicks  PULSES: radial and dorsalis pedis pulses are 2+ and symmetric  ABDOMEN: soft, non-tender, normal bowel sounds, no masses or organomegaly  SKIN: hot, dry, intact, normal turgor, no rashes  EXTREMITIES: no peripheral edema, clubbing, or cyanosis  PSYCH: patient is alert and oriented   Hospital Course by problem list:  1. Acute sinusitis  and bronchitis: Sx onset 2 weeks ago with muscle aches, malaise, and sinonasal congestion. For one week, he has had a mild cough that was occasionally productive with clear to yellow sputum and mild dyspnea. On the day of admission, he was at church and suddenly felt terrible with whole body aches, fevers, and chills. On presentation to the ED, he was started on broad spectrum abx due to concerns for sepsis given HIV history. Blood and respiratory cultures were ordered and are pending, and influenza PCR was ordered as well, which was negative. No evidence of pneumonia on chest x-ray. PCP sputum culture is negative. Although he does have pain in his neck, it is consistent with prior C-spine injury and not meningitis. His overall symptoms are most consistent with sinusitis that seems to be more bacterial in nature, so he was covered for the usual pathogens (Streptococcus pneumoniae, Moraxella catarrhalis, and Haemophilus influenza) with levofloxacin only.  - F/u blood cultures  - Levofloxacin, day #2/7  - Continue Duonebs or Xopenex inhaler as needed  2. Hyponatremia: Hypovolemia related hyponatremia from increased insensible losses and decreased oral intake. He was started on normal saline @125cc /hr on admission, which was continued during his hospitalization. His po intake began to improve, and his IVF was discontinued prior to discharge. His sodium corrected to 139 prior to discharge home. - F/u BMP at his f/u appointment  3. Acute kidney injury: Most likely prerenal given his increased insensible losses and decreased oral intake. IVF were started on admission, as noted above. Cr slowly improving.  - Monitor Cr at his next clinic visit.  4. HIV: Pt is on HAART therapy and endorses compliance with his medications. His last CD4 count was 330 with quantitative HIV RNA load of 40 in July. His home medication regimen was continued on admission. CD4 count and HIV viral load were rechecked this hospitalization  and his CD4  is 400, viral load pending. Dr. Ninetta Lights is on board, appreciate recommendations.  - F/u viral load    Discharge Vitals:  BP 121/73  Pulse 78  Temp(Src) 97.5 F (36.4 C) (Oral)  Resp 17  Ht 5\' 9"  (1.753 m)  Wt 216 lb 11.4 oz (98.3 kg)  BMI 31.99 kg/m2  SpO2 99%  Discharge Labs:  Results for orders placed during the hospital encounter of 10/31/12 (from the past 24 hour(s))  BASIC METABOLIC PANEL     Status: Abnormal   Collection Time    11/02/12  8:07 AM      Result Value Range   Sodium 139  135 - 145 mEq/L   Potassium 4.1  3.5 - 5.1 mEq/L   Chloride 106  96 - 112 mEq/L   CO2 26  19 - 32 mEq/L   Glucose, Bld 103 (*) 70 - 99 mg/dL   BUN 12  6 - 23 mg/dL   Creatinine, Ser 8.11  0.50 - 1.35 mg/dL   Calcium 8.6  8.4 - 91.4 mg/dL   GFR calc non Af Amer 64 (*) >90 mL/min   GFR calc Af Amer 74 (*) >90 mL/min  CBC     Status: Abnormal   Collection Time    11/02/12  8:07 AM      Result Value Range   WBC 4.9  4.0 - 10.5 K/uL   RBC 3.99 (*) 4.22 - 5.81 MIL/uL   Hemoglobin 12.2 (*) 13.0 - 17.0 g/dL   HCT 78.2 (*) 95.6 - 21.3 %   MCV 89.7  78.0 - 100.0 fL   MCH 30.6  26.0 - 34.0 pg   MCHC 34.1  30.0 - 36.0 g/dL   RDW 08.6  57.8 - 46.9 %   Platelets 163  150 - 400 K/uL    Signed: Genelle Gather 11/02/2012, 11:49 AM   Time Spent on Discharge: 35 min Services Ordered on Discharge: none Equipment Ordered on Discharge: none

## 2012-11-02 NOTE — Progress Notes (Signed)
Patient discharged home with sister. Patient was given discharge instructions and information on prescriptions. Patient was told about upcoming appointments. Patient was told to contact doctor with questions and/or concerns. Patient was stable upon discharge.

## 2012-11-03 LAB — RESPIRATORY VIRUS PANEL
Adenovirus: NOT DETECTED
Influenza A H1: NOT DETECTED
Influenza A H3: NOT DETECTED
Influenza A: NOT DETECTED
Influenza B: NOT DETECTED
Metapneumovirus: NOT DETECTED
Parainfluenza 1: NOT DETECTED
Parainfluenza 2: NOT DETECTED
Parainfluenza 3: NOT DETECTED
Respiratory Syncytial Virus A: NOT DETECTED
Respiratory Syncytial Virus B: NOT DETECTED
Rhinovirus: DETECTED — AB

## 2012-11-03 LAB — HIV-1 RNA QUANT-NO REFLEX-BLD
HIV 1 RNA Quant: 25 copies/mL — ABNORMAL HIGH (ref <20)
HIV-1 RNA Quant, Log: 1.4 {Log} — ABNORMAL HIGH (ref <1.30)

## 2012-11-04 LAB — CULTURE, RESPIRATORY

## 2012-11-04 LAB — CULTURE, RESPIRATORY W GRAM STAIN

## 2012-11-04 NOTE — Discharge Summary (Signed)
I saw Casey Brennan on day of discharge and agree with the documented discharge plan.

## 2012-11-07 ENCOUNTER — Telehealth: Payer: Self-pay | Admitting: Internal Medicine

## 2012-11-07 LAB — CULTURE, BLOOD (ROUTINE X 2)
Culture: NO GROWTH
Culture: NO GROWTH

## 2012-11-07 NOTE — Telephone Encounter (Signed)
Casey Brennan called me last night stating that he is still having fever and chills. He was recently hospitalized and has just completed a course of levofloxacin for presumed sinusitis. I will request that he be worked into see one of our clinic doctors as soon as possible.

## 2012-11-08 ENCOUNTER — Other Ambulatory Visit (HOSPITAL_COMMUNITY): Payer: Self-pay

## 2012-11-13 ENCOUNTER — Other Ambulatory Visit: Payer: Medicaid Other

## 2012-11-13 ENCOUNTER — Other Ambulatory Visit: Payer: Self-pay | Admitting: Internal Medicine

## 2012-11-14 NOTE — Telephone Encounter (Signed)
Dr Ninetta Lights is PCP; please send back to the pharmacy.

## 2012-11-15 ENCOUNTER — Telehealth: Payer: Self-pay | Admitting: *Deleted

## 2012-11-15 DIAGNOSIS — B20 Human immunodeficiency virus [HIV] disease: Secondary | ICD-10-CM

## 2012-11-15 NOTE — Telephone Encounter (Signed)
Received refill request for this patient. The refills were for Prezista Truvada, Isentress and Norvir. Per the patient last office visit note he should only be taking Prezista Isentress and Truvada. No mention of Norvir. Will have to ask the provider what he is actually to be taking before I can send refills to the pharmacy.

## 2012-11-18 ENCOUNTER — Other Ambulatory Visit: Payer: Medicaid Other

## 2012-11-18 ENCOUNTER — Other Ambulatory Visit: Payer: Self-pay | Admitting: *Deleted

## 2012-11-18 ENCOUNTER — Ambulatory Visit (INDEPENDENT_AMBULATORY_CARE_PROVIDER_SITE_OTHER): Payer: Medicaid Other | Admitting: Internal Medicine

## 2012-11-18 ENCOUNTER — Encounter: Payer: Self-pay | Admitting: Internal Medicine

## 2012-11-18 VITALS — BP 113/80 | HR 94 | Temp 98.2°F | Wt 206.0 lb

## 2012-11-18 DIAGNOSIS — J45901 Unspecified asthma with (acute) exacerbation: Secondary | ICD-10-CM

## 2012-11-18 DIAGNOSIS — B20 Human immunodeficiency virus [HIV] disease: Secondary | ICD-10-CM

## 2012-11-18 DIAGNOSIS — J329 Chronic sinusitis, unspecified: Secondary | ICD-10-CM

## 2012-11-18 DIAGNOSIS — B351 Tinea unguium: Secondary | ICD-10-CM

## 2012-11-18 DIAGNOSIS — D649 Anemia, unspecified: Secondary | ICD-10-CM

## 2012-11-18 LAB — CBC
HCT: 40.8 % (ref 39.0–52.0)
Hemoglobin: 14.1 g/dL (ref 13.0–17.0)
MCH: 30.5 pg (ref 26.0–34.0)
MCHC: 34.6 g/dL (ref 30.0–36.0)
MCV: 88.1 fL (ref 78.0–100.0)
Platelets: 241 10*3/uL (ref 150–400)
RBC: 4.63 MIL/uL (ref 4.22–5.81)
RDW: 15 % (ref 11.5–15.5)
WBC: 6.8 10*3/uL (ref 4.0–10.5)

## 2012-11-18 LAB — COMPREHENSIVE METABOLIC PANEL
ALT: 14 U/L (ref 0–53)
AST: 19 U/L (ref 0–37)
Albumin: 4.4 g/dL (ref 3.5–5.2)
Alkaline Phosphatase: 103 U/L (ref 39–117)
BUN: 12 mg/dL (ref 6–23)
CO2: 28 mEq/L (ref 19–32)
Calcium: 9.7 mg/dL (ref 8.4–10.5)
Chloride: 104 mEq/L (ref 96–112)
Creat: 1.29 mg/dL (ref 0.50–1.35)
Glucose, Bld: 87 mg/dL (ref 70–99)
Potassium: 4.4 mEq/L (ref 3.5–5.3)
Sodium: 139 mEq/L (ref 135–145)
Total Bilirubin: 0.4 mg/dL (ref 0.3–1.2)
Total Protein: 7.6 g/dL (ref 6.0–8.3)

## 2012-11-18 MED ORDER — LORATADINE 10 MG PO TABS: ORAL_TABLET | ORAL | Status: DC

## 2012-11-18 MED ORDER — IPRATROPIUM BROMIDE 0.02 % IN SOLN: 0.5000 mg | Freq: Three times a day (TID) | RESPIRATORY_TRACT | Status: DC

## 2012-11-18 MED ORDER — RITONAVIR 100 MG PO TABS: ORAL_TABLET | ORAL | Status: DC

## 2012-11-18 MED ORDER — ONDANSETRON HCL 4 MG PO TABS: 4.0000 mg | ORAL_TABLET | Freq: Three times a day (TID) | ORAL | Status: DC | PRN

## 2012-11-18 MED ORDER — RALTEGRAVIR POTASSIUM 400 MG PO TABS: ORAL_TABLET | ORAL | Status: DC

## 2012-11-18 MED ORDER — EMTRICITABINE-TENOFOVIR DF 200-300 MG PO TABS: 1.0000 | ORAL_TABLET | Freq: Every day | ORAL | Status: DC

## 2012-11-18 MED ORDER — DARUNAVIR ETHANOLATE 800 MG PO TABS: ORAL_TABLET | ORAL | Status: DC

## 2012-11-18 MED ORDER — MUPIROCIN 2 % EX OINT: TOPICAL_OINTMENT | CUTANEOUS | Status: DC

## 2012-11-18 NOTE — Telephone Encounter (Signed)
Please refill truvada, norvir, prezista, isentress thanks

## 2012-11-18 NOTE — Progress Notes (Signed)
[facility] CLINIC NOTE  RFV: routine, follow up from hospital  Subjective:    Patient ID: Casey Brennan, male    DOB: 1961-06-12, 52 y.o.   MRN: [id number]  HPI Casey Brennan, with HIV, CD 4 count 400/VL 25 in May 2014, currently on raltegravir BID/DRVr/truvada daily. He is taking his medications consistently. He reports that he is feeling better since his hospitalization for sinusitis.  He needs refills on ART and asthma medicaitons    Current Outpatient Prescriptions on File Prior to Visit  Medication Sig Dispense Refill  . Cholecalciferol 2000 UNITS TABS Take 1 tablet by mouth daily.      . cyanocobalamin (,VITAMIN B-12,) 1000 MCG/ML injection Inject 1,000 mcg into the muscle every 30 (thirty) days.      . diclofenac sodium (VOLTAREN) 1 % GEL Apply 2 g topically 2 (two) times daily as needed. For pain  1 Tube  2  . folic acid (FOLVITE) 1 MG tablet Take 1 mg by mouth daily.      [name] gabapentin (NEURONTIN) 600 MG tablet Take 600 mg by mouth 4 (four) times daily.       [name] guaiFENesin-dextromethorphan (ROBITUSSIN DM) 100-10 MG/5ML syrup Take 5 mLs by mouth every 4 (four) hours as needed for cough.  118 mL  2  . hydrOXYzine (VISTARIL) 25 MG capsule Take 25 mg by mouth 4 (four) times daily as needed. For itching      . ipratropium (ATROVENT) 0.02 % nebulizer solution Take 2.5 mLs (0.5 mg total) by nebulization 3 (three) times daily.  75 mL  6  . levalbuterol (XOPENEX HFA) 45 MCG/ACT inhaler Inhale 1-2 puffs into the lungs every 4 (four) hours as needed for wheezing.  1 Inhaler  12  . levalbuterol (XOPENEX) 0.63 MG/3ML nebulizer solution Take 3 mLs (0.63 mg total) by nebulization 3 (three) times daily.  3 mL  6  . Multiple Vitamin (MULTIVITAMIN WITH MINERALS) TABS Take 1 tablet by mouth daily.      [name] omeprazole (PRILOSEC) 40 MG capsule Take 40 mg by mouth daily.       . tizanidine (ZANAFLEX) 6 MG capsule Take 6 mg by mouth every 6 (six) hours.      . traZODone (DESYREL) 50 MG tablet Take 1 tablet  (50 mg total) by mouth at bedtime.  30 tablet  3  . zolpidem (AMBIEN CR) 12.5 MG CR tablet Take 1 tablet (12.5 mg total) by mouth at bedtime as needed. For sleep  30 tablet  0  . [DISCONTINUED] famotidine (PEPCID AC) 10 MG tablet One po bid x 3 days and then prn upper abdominal pain  20 tablet  0   No current facility-administered medications on file prior to visit.      Soc hx: very busy with church active   Review of Systems 12 point ROS is negative except for sinus congestion and nausea occasionally    Objective:   Physical Exam BP 113/80  Pulse 94  Temp(Src) 98.2 F (36.8 C) (Oral)  Wt 206 lb (93.441 kg)  BMI 30.41 kg/m2 Physical Exam  Constitutional: He is oriented to person, place, and time. He appears well-developed and well-nourished. No distress.  HENT:  Mouth/Throat: Oropharynx is clear and moist. No oropharyngeal exudate.  Cardiovascular: Normal rate, regular rhythm and normal heart sounds. Exam reveals no gallop and no friction rub.  No murmur heard.  Pulmonary/Chest: Effort normal and breath sounds normal. No respiratory distress. He has no wheezes.  Abdominal: Soft. Bowel sounds  are normal. He exhibits no distension. There is no tenderness.  Lymphadenopathy:  He has no cervical adenopathy.  Neurological: He is alert and oriented to person, place, and time.  Skin: Skin is warm and dry. No rash noted. No erythema.  Psychiatric: He has a normal mood and affect. His behavior is normal.          Assessment & Plan:   asthma = to be managed by his PCP. Will have his appt moved up and ask them to deal with pre-approval for refill on atrovent and xopenex on inhaler and nebulizer  hiv = refilled his HIV meds  Nausea= gave rx for zofran  Sinus congestion = gave refill on loratidine

## 2012-11-19 ENCOUNTER — Telehealth: Payer: Self-pay | Admitting: *Deleted

## 2012-11-19 DIAGNOSIS — J45901 Unspecified asthma with (acute) exacerbation: Secondary | ICD-10-CM

## 2012-11-19 MED ORDER — CYANOCOBALAMIN 1000 MCG/ML IJ SOLN
1000.0000 ug | Freq: Once | INTRAMUSCULAR | Status: AC
  Administered 2012-11-18: 1000 ug via INTRAMUSCULAR

## 2012-11-19 NOTE — Telephone Encounter (Addendum)
Received faxed prior authorization request from pt's pharmacy. Levalbuterol 0.63mg /25ml neb non-preferred.  Preferreed meds are albuterol sulfate 2.22ml/0.5ml and 2.5mg /15ml.  Also, insurance will not cover Xopenex HFA INH, preferred meds are proventil HFA or Ventolin HFA.  Will forward to prescribing MD for review.  If preferred meds have a documented trial and failure or contraindicated then MD can complete form that will then be faxed back to [facility] for review.  I did speak with pt's sister/caregiver (April) and she informed me that pt does not do well on the proventil or ventolin and it makes his heart race.  Will send info to Dr Sherrine Maples for review. Please advise. Criss Alvine, Naphtali Zywicki Cassady5/20/201410:28 AM

## 2012-11-20 ENCOUNTER — Other Ambulatory Visit: Payer: Self-pay | Admitting: Internal Medicine

## 2012-11-20 ENCOUNTER — Other Ambulatory Visit: Payer: Self-pay | Admitting: Oncology

## 2012-11-20 LAB — HIV-1 RNA QUANT-NO REFLEX-BLD
HIV 1 RNA Quant: 105 copies/mL — ABNORMAL HIGH (ref <20)
HIV-1 RNA Quant, Log: 2.02 {Log} — ABNORMAL HIGH (ref <1.30)

## 2012-11-20 LAB — T-HELPER CELL (CD4) - (RCID CLINIC ONLY)
CD4 % Helper T Cell: 21 % — ABNORMAL LOW (ref 33–55)
CD4 T Cell Abs: 500 uL (ref 400–2700)

## 2012-11-21 ENCOUNTER — Telehealth: Payer: Self-pay | Admitting: *Deleted

## 2012-11-21 ENCOUNTER — Ambulatory Visit (INDEPENDENT_AMBULATORY_CARE_PROVIDER_SITE_OTHER): Payer: Medicaid Other | Admitting: Internal Medicine

## 2012-11-21 ENCOUNTER — Encounter: Payer: Self-pay | Admitting: Internal Medicine

## 2012-11-21 VITALS — BP 127/90 | HR 68 | Temp 97.6°F | Ht 68.5 in | Wt 208.8 lb

## 2012-11-21 DIAGNOSIS — H9210 Otorrhea, unspecified ear: Secondary | ICD-10-CM

## 2012-11-21 DIAGNOSIS — J45909 Unspecified asthma, uncomplicated: Secondary | ICD-10-CM

## 2012-11-21 DIAGNOSIS — M898X5 Other specified disorders of bone, thigh: Secondary | ICD-10-CM

## 2012-11-21 DIAGNOSIS — J329 Chronic sinusitis, unspecified: Secondary | ICD-10-CM

## 2012-11-21 DIAGNOSIS — H9212 Otorrhea, left ear: Secondary | ICD-10-CM

## 2012-11-21 DIAGNOSIS — M948X9 Other specified disorders of cartilage, unspecified sites: Secondary | ICD-10-CM

## 2012-11-21 MED ORDER — SALINE NASAL SPRAY 0.65 % NA SOLN: 1.0000 | NASAL | Status: DC | PRN

## 2012-11-21 MED ORDER — DEXTROMETHORPHAN HBR 15 MG/5ML PO SYRP: 10.0000 mL | ORAL_SOLUTION | Freq: Four times a day (QID) | ORAL | Status: DC | PRN

## 2012-11-21 NOTE — Assessment & Plan Note (Signed)
Unclear etiology. Will refer patient to ENT for further evaluation and management.

## 2012-11-21 NOTE — Assessment & Plan Note (Signed)
No PFTs in records. Will obtain pulmonary function test. Unclear about episodes of blue lips and fingers. Oxygen saturation during this office visit is within normal limits. Patient got approval for Xopenex nebulizer and inhaler. Refill sent to the pharmacy.

## 2012-11-21 NOTE — Telephone Encounter (Signed)
PA submitted online via [employer] for pt's xopenex inhaler and neb solution.  Medication was approved for one year, pt gets tachycardic using albuterol.  Pt, pharmacy, and MD made aware.  Of note, I changed the qty on pt's atrovent neb solution to reflect a one mth supply. Phone call complete.Casey Spittle Cassady5/22/20143:20 PM

## 2012-11-21 NOTE — Telephone Encounter (Signed)
Call from pt's sister, April stating patient was d/c from hospital on 5/7 for sinusitis.  He had a f/u at [facility] on 5/19.  He was been doing well.  She reports yesterday he had an episode where his lips and nail beds turned blue, he was coughing and feeling hot.  Pt was given a breathing treatment and condition improved.  Today color is normal pt is alert, eating and drinking well. Denies fever. They are asking to be seen because of return of sinus drainage through ears.   This was the symptom he had before hospitalization.   Will see today at 1:15

## 2012-11-21 NOTE — Progress Notes (Signed)
Subjective:   Patient ID: Casey Brennan male   DOB: 02/14/1961 52 y.o.   MRN: [id number]  HPI: Casey Brennan is a 52 y.o. male with past history significant as outlined below who presented to the clinic with episode of lips and nailbeds turning blue. He was coughing and feeling hot. He received some breathing treatment and condition improved somewhat. He experienced this yesterday. Patient was admitted on 10/31/12 for acute bronchitis and sinusitis. Patient was treated with levofloxacin.   Patient report that since yesterday he feels more congestion, productive cough with green sputum since this morning,and chills but did not take his temperature.   Was taking Robitussin and Nyquil  but with no significant improvement Patient further reported that he is experiencing drainage from his left ear on a regular basis. He which use Q-tips and noticed brown to green discharge from his years.   Past Medical History  Diagnosis Date  . HIV (human immunodeficiency virus infection) 1988  . Pneumonia 07/12/10    SEPTIC SHOCK  . Severe frontal headaches 08/16/10  . Chest pain 07/12/10    pt states no hx of chest pain - 12/25/11  . Renal cyst   . Asthma   . Gastric ulcer   . Hematuria     negative w/u with Dr. Jory Sims.  Assumed passed stone.    Current Outpatient Prescriptions  Medication Sig Dispense Refill  . Cholecalciferol 2000 UNITS TABS Take 1 tablet by mouth daily.      . cyanocobalamin (,VITAMIN B-12,) 1000 MCG/ML injection Inject 1,000 mcg into the muscle every 30 (thirty) days.      . Darunavir Ethanolate (PREZISTA) 800 MG tablet TAKE 1 TABLET BY MOUTH DAILY WITH NORVIR AND TRUVADA  30 tablet  6  . dextromethorphan (TUSSIN COUGH) 15 MG/5ML syrup Take 10 mLs (30 mg total) by mouth 4 (four) times daily as needed for cough.  120 mL  0  . diclofenac sodium (VOLTAREN) 1 % GEL Apply 2 g topically 2 (two) times daily as needed. For pain  1 Tube  2  . emtricitabine-tenofovir (TRUVADA)  200-300 MG per tablet Take 1 tablet by mouth daily. TAKE WITH PREZISTA AND NORVIR.  30 tablet  6  . folic acid (FOLVITE) 1 MG tablet Take 1 mg by mouth daily.      [name] gabapentin (NEURONTIN) 600 MG tablet Take 600 mg by mouth 4 (four) times daily.       . hydrOXYzine (VISTARIL) 25 MG capsule Take 25 mg by mouth 4 (four) times daily as needed. For itching      . ipratropium (ATROVENT) 0.02 % nebulizer solution Take 2.5 mLs (0.5 mg total) by nebulization 3 (three) times daily.  75 mL  6  . levalbuterol (XOPENEX HFA) 45 MCG/ACT inhaler Inhale 1-2 puffs into the lungs every 4 (four) hours as needed for wheezing.  1 Inhaler  12  . levalbuterol (XOPENEX) 0.63 MG/3ML nebulizer solution Take 3 mLs (0.63 mg total) by nebulization 3 (three) times daily.  3 mL  6  . loratadine (CLARITIN) 10 MG tablet TAKE 1 TABLET BY MOUTH DAILY for seasonal allergies  30 tablet  6  . Multiple Vitamin (MULTIVITAMIN WITH MINERALS) TABS Take 1 tablet by mouth daily.      . mupirocin ointment (BACTROBAN) 2 % APPLY TOPICALLY TO THE AFFECTED AREA TWICE DAILY FOR 2 WEEKS. PLACE COTTON BETWEEN TOES TO ALLOW BETTER AERATION  66 g  0  . omeprazole (PRILOSEC) 40 MG capsule Take 40 mg  by mouth daily.       . ondansetron (ZOFRAN) 4 MG tablet Take 1 tablet (4 mg total) by mouth every 8 (eight) hours as needed for nausea.  30 tablet  6  . raltegravir (ISENTRESS) 400 MG tablet TAKE 1 TABLET BY MOUTH TWICE DAILY  60 tablet  6  . ritonavir (NORVIR) 100 MG TABS TAKE 1 TABLET BY MOUTH DAILY WITH PREZISTA AND TRUVADA  30 tablet  6  . sodium chloride (OCEAN) 0.65 % nasal spray Place 1 spray into the nose as needed for congestion.  30 mL  12  . tizanidine (ZANAFLEX) 6 MG capsule Take 6 mg by mouth every 6 (six) hours.      . traZODone (DESYREL) 50 MG tablet Take 1 tablet (50 mg total) by mouth at bedtime.  30 tablet  3  . zolpidem (AMBIEN CR) 12.5 MG CR tablet Take 1 tablet (12.5 mg total) by mouth at bedtime as needed. For sleep  30 tablet  0  .  [DISCONTINUED] famotidine (PEPCID AC) 10 MG tablet One po bid x 3 days and then prn upper abdominal pain  20 tablet  0   No current facility-administered medications for this visit.   Family History  Problem Relation Age of Onset  . Stroke Father   . Diabetes Father   . Cancer Father     brain cancer  . Hypertension Sister   . Cancer Maternal Uncle     prostate cancer   History   Social History  . Marital Status: Legally Separated    Spouse Name: N/A    Number of Children: N/A  . 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 Smoking status: Former Smoker    Types: Cigars    Quit date: 05/22/2010  . Smokeless tobacco: Never Used  . Alcohol Use: No     Comment: rare  . Drug Use: No  . Sexually Active: Not Currently    Birth Control/ Protection: Condom     Comment: pt. declined condoms   Other Topics Concern  . None   Social History Narrative   Sister April stays with him currently although he hopes to return to truck driving. 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 Sister is starting medical school next year.   Review of Systems: Constitutional: Denies diaphoresis, appetite change and fatigue.  HEENT:  Noted ear drainage, congestion, sore throat, rhinorrhea but denies denies  hearing loss, mouth sores, trouble swallowing, neck pain, neck stiffness and tinnitus.   Respiratory: Noted cough but denies SOB, chest tightness,  and wheezing.   Cardiovascular: Denies chest pain, palpitations and leg swelling.  Gastrointestinal: Denies nausea, vomiting, abdominal pain, diarrhea, constipation, blood in stool and abdominal distention.  Genitourinary: Denies dysuria, urgency, frequency, hematuria, flank pain and difficulty urinating.  Endocrine: Denies: hot or cold intolerance, sweats, changes in hair or nails, polyuria, polydipsia. Musculoskeletal: Denies myalgias, back pain, joint swelling,  arthralgias and gait problem.  Skin: Denies pallor, rash and wound.  Neurological: Denies dizziness, seizures, syncope, weakness, light-headedness, numbness and headaches.  Hematological: Denies adenopathy. Easy bruising, personal or family bleeding history  Psychiatric/Behavioral: Denies suicidal ideation, mood changes, confusion, nervousness, sleep disturbance and agitation  Objective:  Physical Exam: Filed Vitals:   11/21/12 1329  BP: 127/90  Pulse: 68  Temp: 97.6 F (36.4 C)  TempSrc: Oral  Height: 5' 8.5" (1.74  m)  Weight: 208 lb 12.8 oz (94.711 kg)  SpO2: 94%   Constitutional: Vital signs reviewed.  Patient is a well-developed and well-nourished male in no acute distress and cooperative with exam. Alert and oriented x3.  Ear: TM normal on the left but not able to visualize right tympanic membrane Mouth: no erythema or exudates, MMM Eyes: PERRL, EOMI, conjunctivae normal, No scleral icterus.  Neck: Supple,  Cardiovascular: RRR, S1 normal, S2 normal, no MRG, pulses symmetric and intact bilaterally Pulmonary/Chest: CTAB, no wheezes, rales, or rhonchi Abdominal: Soft. Non-tender, non-distended, bowel sounds are normal,  Hematology: no cervical adenopathy.  Neurological: A&O x3.  Skin: Warm, dry and intact. No rash, cyanosis, or clubbing.

## 2012-11-21 NOTE — Patient Instructions (Addendum)
If you're experiencing worsening shortness of breath, cough, fevers and chills please go to the emergency room for further version management or call the clinic.

## 2012-11-21 NOTE — Assessment & Plan Note (Signed)
Less likely acute sinusitis and need for antibiotic therapy today. Recommended Tussionex cough syrup which per patient works during hospitalization. Will refer patient to ENT for further evaluation and management

## 2012-11-21 NOTE — Telephone Encounter (Signed)
Left message at pharmacy to send refill request to Dr Moshe Cipro office - he is listed PCP.

## 2012-11-21 NOTE — Telephone Encounter (Signed)
I cannot see that this is an [facility] patient.  Last visit here was a year ago.  At his recent discharge from B service, there is no referral to [facility].  If we are to consider an opiate, he must be seen here.  Otherwise, this should be sent to [facility].

## 2012-11-22 NOTE — Progress Notes (Signed)
Case discussed with Dr.Illath soon after the resident saw the patient.  We reviewed the resident's history and exam and pertinent patient test results.  I agree with the assessment, diagnosis and plan of care documented in the resident's note. 

## 2012-11-24 ENCOUNTER — Telehealth: Payer: Self-pay | Admitting: Internal Medicine

## 2012-11-24 MED ORDER — HYDROCOD POLST-CHLORPHEN POLST 10-8 MG/5ML PO LQCR: 5.0000 mL | Freq: Two times a day (BID) | ORAL | Status: DC | PRN

## 2012-11-24 NOTE — Telephone Encounter (Addendum)
  INTERNAL MEDICINE RESIDENCY PROGRAM After-Hours Telephone Call    Reason for call:   I received a call from [name] sister and caregiver at 10 AM indicating that patient was feeling pressure and sloshing around in his head, described as 9/10 with bilateral ear drainage & cough with green sputum, similar to what he was experiencing at clinic follow up on 11/21/12.  She reports that he had a temp of 101 this morning (3a), gave tylenol and subsequently called Dr. Algis Liming who prescribed azithromax.  Temp later this morning was 98.6.  She reports that the tussionex was not sent in by clinic. He is oriented to time, place and person. He has felt nauseous but without vomiting.  He has been taking all of his other medications.  He has felt some SOB but improved with his nebulizer.     Pertinent Data:   [name] was discharged from the hospital on 11/02/12 after admission for acute bronchitis & sinusitis seen on head CT (air fluid levels in both maxillary antra with opacification of ethmoid air cells), and he was treated with levofloxacin for 7 days with improvement of symptoms.  He was seen in clinic on 11/21/12 with return of symptoms and xopenex was refilled (neb & inhaler approved), as well as referral to ENT for sinusitis and ear drainage.  They went to appt on 11/22/12, but left without being seen because patient had to wait for 3 hours, and they plan to re-schedule with a different doctor.     Assessment / Plan / Recommendations:   Recommend starting azithromax as suggested by Dr. Algis Liming.  Also phoned in prescription for tussionex cough syrup.  Continue to use tylenol for fever mgmt and consider nasal saline (patient's sister reports that saline makes his nose bleed, but that may have also been a side effect of nasal steroids - patient's sister is not clear regarding timing).  Patient should re-schedule ENT visit.  As always, pt is advised that if symptoms worsen or new  symptoms arise, they should go to an urgent care facility or to to ER for further evaluation.    Belia Heman, MD   11/24/2012, 11:32 AM

## 2012-11-26 NOTE — Telephone Encounter (Signed)
Pt's "caretaker" had called me at 330 am on Sunday am to voice concerns about his lack of response to otc med and nasal steroids. I had called in azithromycin. I asked what reason was for 330am call vs during daytime, evening hours and did not receive satisfactory answer as to reason for the call at that hour. Regardless azithro called in and we can offer appt this week. With her main ID provider if necessary. The pt himself never came onto the phone

## 2012-11-27 ENCOUNTER — Encounter: Payer: Self-pay | Admitting: Infectious Diseases

## 2012-11-27 ENCOUNTER — Ambulatory Visit (INDEPENDENT_AMBULATORY_CARE_PROVIDER_SITE_OTHER): Payer: Medicaid Other | Admitting: Infectious Diseases

## 2012-11-27 ENCOUNTER — Ambulatory Visit (HOSPITAL_COMMUNITY)
Admission: RE | Admit: 2012-11-27 | Discharge: 2012-11-27 | Disposition: A | Discharge status: Home / Self Care | Payer: Medicaid Other | Source: Ambulatory Visit | Attending: Internal Medicine | Admitting: Internal Medicine

## 2012-11-27 VITALS — BP 138/87 | HR 103 | Temp 98.3°F | Ht 69.0 in | Wt 209.0 lb

## 2012-11-27 DIAGNOSIS — J329 Chronic sinusitis, unspecified: Secondary | ICD-10-CM

## 2012-11-27 DIAGNOSIS — J4540 Moderate persistent asthma, uncomplicated: Secondary | ICD-10-CM

## 2012-11-27 DIAGNOSIS — B20 Human immunodeficiency virus [HIV] disease: Secondary | ICD-10-CM

## 2012-11-27 DIAGNOSIS — J45909 Unspecified asthma, uncomplicated: Secondary | ICD-10-CM

## 2012-11-27 MED ORDER — ALBUTEROL SULFATE (5 MG/ML) 0.5% IN NEBU
2.5000 mg | INHALATION_SOLUTION | Freq: Once | RESPIRATORY_TRACT | Status: AC
  Administered 2012-11-27: 2.5 mg via RESPIRATORY_TRACT

## 2012-11-27 MED ORDER — MOMETASONE FUROATE 50 MCG/ACT NA SUSP: 2.0000 | Freq: Every day | NASAL | Status: DC

## 2012-11-27 MED ORDER — ALBUTEROL SULFATE (5 MG/ML) 0.5% IN NEBU: 2.5000 mg | INHALATION_SOLUTION | Freq: Once | RESPIRATORY_TRACT | Status: DC

## 2012-11-27 NOTE — Assessment & Plan Note (Signed)
Will refer him to Dr Lucie Leather. Will give him 1 week course of inhaled steroid (short course due to potential for adrenal suppression with DRVr).

## 2012-11-27 NOTE — Telephone Encounter (Signed)
Scheduled with [facility] for 6/4 at 2:45.

## 2012-11-27 NOTE — Assessment & Plan Note (Signed)
He is doing very well. Offered/refused condoms. Hopefully get him to undetectable but glad he is maintaining his CD4. Will see him back in 6 months. Hep B SAb + this year.

## 2012-11-27 NOTE — Telephone Encounter (Signed)
i just saw pt

## 2012-11-27 NOTE — Telephone Encounter (Signed)
Very good 

## 2012-11-27 NOTE — Assessment & Plan Note (Signed)
To have PFTs done today.

## 2012-11-27 NOTE — Progress Notes (Signed)
  Subjective:    Patient ID: Leafy Half, male    DOB: February 05, 1961, 52 y.o.   MRN: [id number]  HPI 52 yo M with hx of HIV+, was adm to hospital earlier this month with sinusitis, bronchitis (sputum Cx grew S pneumoniae). Also seen earlier this month with ear d/c. Was referred to [facility] (sat in room for 2 hours without being seen, left). Still having problems with his sinuses: headaches, drainage, congestion (thick mucous, was given rx for azithro- 5-23- with some improvement). Has had chills, no fever.   Still aving some episodes of sob, taking breathing tx. Lips and fingers turn purple.   HIV 1 RNA Quant (copies/mL)  Date Value  11/18/2012 105*  11/01/2012 25*  01/19/2012 40*     CD4 T Cell Abs (cmm)  Date Value  11/18/2012 500   11/01/2012 400   01/19/2012 330*    Taking DRVr/TRV/ISN  Review of Systems  Constitutional: Negative for appetite change and unexpected weight change.  HENT: Positive for congestion, rhinorrhea and sinus pressure.   Gastrointestinal: Negative for diarrhea and constipation.  Genitourinary: Negative for difficulty urinating.       Objective:   Physical Exam  Constitutional: He appears well-developed and well-nourished.  HENT:  Mouth/Throat: No oropharyngeal exudate.  Eyes: EOM are normal. Pupils are equal, round, and reactive to light.  Neck: Neck supple.  Cardiovascular: Normal rate, regular rhythm and normal heart sounds.   Pulmonary/Chest: Effort normal and breath sounds normal. No respiratory distress. He has no wheezes.  Abdominal: Soft. Bowel sounds are normal. He exhibits no distension. There is no tenderness.  Lymphadenopathy:    He has no cervical adenopathy.          Assessment & Plan:

## 2012-11-27 NOTE — Telephone Encounter (Signed)
He is seeing Dr. Ninetta Lights today (5/28) at 9:30am. Thanks Asher Muir

## 2012-11-27 NOTE — Telephone Encounter (Signed)
thx Trey Paula!

## 2012-12-04 ENCOUNTER — Ambulatory Visit: Payer: Medicaid Other | Admitting: Infectious Diseases

## 2012-12-10 ENCOUNTER — Other Ambulatory Visit: Payer: Self-pay | Admitting: *Deleted

## 2012-12-10 DIAGNOSIS — G4700 Insomnia, unspecified: Secondary | ICD-10-CM

## 2012-12-11 MED ORDER — ZOLPIDEM TARTRATE ER 12.5 MG PO TBCR: 12.5000 mg | EXTENDED_RELEASE_TABLET | Freq: Every evening | ORAL | Status: DC | PRN

## 2012-12-11 NOTE — Telephone Encounter (Signed)
Rx called in 

## 2012-12-12 ENCOUNTER — Other Ambulatory Visit: Payer: Self-pay | Admitting: *Deleted

## 2012-12-12 DIAGNOSIS — G4700 Insomnia, unspecified: Secondary | ICD-10-CM

## 2012-12-12 MED ORDER — TRAZODONE HCL 50 MG PO TABS: 50.0000 mg | ORAL_TABLET | Freq: Every day | ORAL | Status: DC

## 2012-12-16 ENCOUNTER — Other Ambulatory Visit: Payer: Self-pay | Admitting: *Deleted

## 2012-12-17 ENCOUNTER — Ambulatory Visit (INDEPENDENT_AMBULATORY_CARE_PROVIDER_SITE_OTHER): Payer: Medicaid Other | Admitting: Internal Medicine

## 2012-12-17 ENCOUNTER — Encounter: Payer: Self-pay | Admitting: Internal Medicine

## 2012-12-17 VITALS — BP 109/73 | HR 88 | Temp 98.2°F | Ht 68.5 in | Wt 208.8 lb

## 2012-12-17 DIAGNOSIS — R05 Cough: Secondary | ICD-10-CM | POA: Insufficient documentation

## 2012-12-17 DIAGNOSIS — R059 Cough, unspecified: Secondary | ICD-10-CM

## 2012-12-17 DIAGNOSIS — H9210 Otorrhea, unspecified ear: Secondary | ICD-10-CM

## 2012-12-17 DIAGNOSIS — H9212 Otorrhea, left ear: Secondary | ICD-10-CM

## 2012-12-17 DIAGNOSIS — G4700 Insomnia, unspecified: Secondary | ICD-10-CM

## 2012-12-17 MED ORDER — ZOLPIDEM TARTRATE ER 12.5 MG PO TBCR: 12.5000 mg | EXTENDED_RELEASE_TABLET | Freq: Every evening | ORAL | Status: DC | PRN

## 2012-12-17 NOTE — Progress Notes (Signed)
Patient ID: Casey Brennan, male   DOB: 1960-12-16, 52 y.o.   MRN: [id number] Subjective:   Patient ID: Casey Brennan male   DOB: Mar 11, 1961 52 y.o.   MRN: [id number]  CC:  Follow up visit.  HPI:  [name] is a 52 y.o. man with past medical history as outlined below, who presents for a follow up visit.  1. Coughing: patient was admitted on 10/31/12 for acute bronchitis and sinusitis. He was treated with IV antibx for 2 days and then discharged on oral Levaquin for 5 days. Sine his cough did no improve after completion of these antibx, he was then treated with Z-pack for another 5 days by Dr. Daiva Eves. Today he reports that he does no have fever, chills, chest pain. His cough is slightly better, but he still has cough and yellow colored sputum. During our conversation (about 20 min), he did not have any cough. He was sitting in chair comfortablely. He had PFT on 11/27/12 which did not show obstructive lung disease (FEV1 79%, FEV1/FVC ratio 86%, TLC 111%, DLco 120).  2. Ear drainage: in his previous visit on 5/22, he was given a referral to ENT due to left ear drainage. He went to the ENT office, but he did not wait long enough to be seen by ENT doctor since the doctor was too busy to see him soon in that day. Currently his ear drainage resolved. He dose not have any ear pain or drainage today.    Past Medical History  Diagnosis Date  . HIV (human immunodeficiency virus infection) 1988  . Pneumonia 07/12/10    SEPTIC SHOCK  . Severe frontal headaches 08/16/10  . Chest pain 07/12/10    pt states no hx of chest pain - 12/25/11  . Renal cyst   . Asthma   . Gastric ulcer   . Hematuria     negative w/u with Dr. Jory Sims.  Assumed passed stone.    Current Outpatient Prescriptions  Medication Sig Dispense Refill  . azithromycin (ZITHROMAX) 250 MG tablet Take 500 mg by mouth daily. Take 500 mg on day one, then 250 mg x 4 days      . Cholecalciferol 2000 UNITS TABS Take 1 tablet by mouth  daily.      . cyanocobalamin (,VITAMIN B-12,) 1000 MCG/ML injection Inject 1,000 mcg into the muscle every 30 (thirty) days.      . Darunavir Ethanolate (PREZISTA) 800 MG tablet TAKE 1 TABLET BY MOUTH DAILY WITH NORVIR AND TRUVADA  30 tablet  6  . dextromethorphan (TUSSIN COUGH) 15 MG/5ML syrup Take 10 mLs (30 mg total) by mouth 4 (four) times daily as needed for cough.  120 mL  0  . diclofenac sodium (VOLTAREN) 1 % GEL Apply 2 g topically 2 (two) times daily as needed. For pain  1 Tube  2  . emtricitabine-tenofovir (TRUVADA) 200-300 MG per tablet Take 1 tablet by mouth daily. TAKE WITH PREZISTA AND NORVIR.  30 tablet  6  . folic acid (FOLVITE) 1 MG tablet Take 1 mg by mouth daily.      [name] gabapentin (NEURONTIN) 600 MG tablet Take 600 mg by mouth 4 (four) times daily.       . hydrOXYzine (VISTARIL) 25 MG capsule Take 25 mg by mouth 4 (four) times daily as needed. For itching      . ipratropium (ATROVENT) 0.02 % nebulizer solution Take 2.5 mLs (0.5 mg total) by nebulization 3 (three) times daily.  75  mL  6  . levalbuterol (XOPENEX HFA) 45 MCG/ACT inhaler Inhale 1-2 puffs into the lungs every 4 (four) hours as needed for wheezing.  1 Inhaler  12  . levalbuterol (XOPENEX) 0.63 MG/3ML nebulizer solution Take 3 mLs (0.63 mg total) by nebulization 3 (three) times daily.  3 mL  6  . loratadine (CLARITIN) 10 MG tablet TAKE 1 TABLET BY MOUTH DAILY for seasonal allergies  30 tablet  6  . mometasone (NASONEX) 50 MCG/ACT nasal spray Place 2 sprays into the nose daily. 2 sprays each nostril bid for 1 week.  17 g  0  . Multiple Vitamin (MULTIVITAMIN WITH MINERALS) TABS Take 1 tablet by mouth daily.      . mupirocin ointment (BACTROBAN) 2 % APPLY TOPICALLY TO THE AFFECTED AREA TWICE DAILY FOR 2 WEEKS. PLACE COTTON BETWEEN TOES TO ALLOW BETTER AERATION  66 g  0  . omeprazole (PRILOSEC) 40 MG capsule Take 40 mg by mouth daily.       . ondansetron (ZOFRAN) 4 MG tablet Take 1 tablet (4 mg total) by mouth every 8  (eight) hours as needed for nausea.  30 tablet  6  . raltegravir (ISENTRESS) 400 MG tablet TAKE 1 TABLET BY MOUTH TWICE DAILY  60 tablet  6  . ritonavir (NORVIR) 100 MG TABS TAKE 1 TABLET BY MOUTH DAILY WITH PREZISTA AND TRUVADA  30 tablet  6  . sodium chloride (OCEAN) 0.65 % nasal spray Place 1 spray into the nose as needed for congestion.  30 mL  12  . tizanidine (ZANAFLEX) 6 MG capsule Take 6 mg by mouth every 6 (six) hours.      . traZODone (DESYREL) 50 MG tablet Take 1 tablet (50 mg total) by mouth at bedtime.  30 tablet  3  . zolpidem (AMBIEN CR) 12.5 MG CR tablet Take 1 tablet (12.5 mg total) by mouth at bedtime as needed. For sleep  30 tablet  0  . [DISCONTINUED] famotidine (PEPCID AC) 10 MG tablet One po bid x 3 days and then prn upper abdominal pain  20 tablet  0   No current facility-administered medications for this visit.   Family History  Problem Relation Age of Onset  . Stroke Father   . Diabetes Father   . Cancer Father     brain cancer  . Hypertension Sister   . Cancer Maternal Uncle     prostate cancer   History   Social History  . Marital Status: Legally Separated    Spouse Name: N/A    Number of Children: N/A  . 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 Smoking status: Former Smoker    Types: Cigars    Quit date: 05/22/2010  . Smokeless tobacco: Never Used  . Alcohol Use: No     Comment: rare  . Drug Use: No  . Sexually Active: Not Currently    Birth Control/ Protection: Condom     Comment: pt. declined condoms   Other Topics Concern  . None   Social History Narrative   Sister April stays with him currently although he hopes to return to truck driving. 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 Sister is starting medical school next year.    Review of Systems: Full 14-point review of systems otherwise negative. See HPI.   Objective:  Physical Exam: Filed Vitals:  12/17/12 1535  BP: 109/73  Pulse: 88  Temp: 98.2 F (36.8 C)  TempSrc: Oral  Height: 5' 8.5" (1.74 m)  Weight: 208 lb 12.8 oz (94.711 kg)  SpO2: 94%   Constitutional: Vital signs reviewed.  Patient is a well-developed and well-nourished, in no acute distress and cooperative with exam.   HEENT:  Head: Normocephalic and atraumatic Ear: TM normal bilaterally. No pain, no ear drainage bilaterally. Mouth: no erythema or exudates, MMM Eyes: PERRL, EOMI, conjunctivae normal, No scleral icterus.  Neck: Supple, Trachea midline normal ROM, No JVD, mass, thyromegaly, or carotid bruit present. No lymph node enlargement. Cardiovascular: RRR, S1 normal, S2 normal, no MRG, pulses symmetric and intact bilaterally Pulmonary/Chest: CTAB, no wheezes, rales, or rhonchi Abdominal: Soft. Non-tender, non-distended, bowel sounds are normal, no masses, organomegaly, or guarding present.  GU: no CVA tenderness Musculoskeletal: No joint deformities, erythema, or stiffness, ROM full and non-tender Hematology: no cervical, inginal, or axillary adenopathy.  Neurological: A&O x3, Strength is normal and symmetric bilaterally, cranial nerve II-XII are grossly intact, no focal motor deficit, sensory intact to light touch bilaterally.  Skin: Warm, dry and intact. No rash, cyanosis, or clubbing.  Psychiatric: Normal mood and affect. speech and behavior is normal. Judgment and thought content normal. Cognition and memory are normal.   Assessment & Plan:

## 2012-12-17 NOTE — Assessment & Plan Note (Signed)
Patient's cough is mild if any. He did not have any coughing during our conversation in about 20 min. He was sitting and talking comfortably. He does not have fever or chills, no chest pain. His lung ausculation is clear bilaterally. His cough is most likely due to the convalescent phase of his previous bronchitis. He is in the recovering stage. Will continue his Tussinex and follow up in one month. No indication for antibiotics.

## 2012-12-17 NOTE — Patient Instructions (Signed)
1. You have done great job in taking all your medications. I appreciate it very much. Please continue doing that. 2. Please take all medications as prescribed.  3. If you have worsening of your symptoms or new symptoms arise, please call the clinic ([phone]), or go to the ER immediately if symptoms are severe.     

## 2012-12-17 NOTE — Assessment & Plan Note (Signed)
It has resolved. There is no ear drainage or ear pain. Ear exam is normal. No fever or chills. No further treatment needed.

## 2012-12-18 NOTE — Progress Notes (Signed)
Case discussed with Dr. Niu soon after the resident saw the patient.  We reviewed the resident's history and exam and pertinent patient test results.  I agree with the assessment, diagnosis and plan of care documented in the resident's note. 

## 2012-12-24 ENCOUNTER — Other Ambulatory Visit: Payer: Self-pay | Admitting: Licensed Clinical Social Worker

## 2012-12-24 DIAGNOSIS — B351 Tinea unguium: Secondary | ICD-10-CM

## 2012-12-24 MED ORDER — MUPIROCIN 2 % EX OINT: TOPICAL_OINTMENT | CUTANEOUS | Status: DC

## 2013-01-06 ENCOUNTER — Other Ambulatory Visit: Payer: Self-pay | Admitting: Internal Medicine

## 2013-01-06 DIAGNOSIS — J4520 Mild intermittent asthma, uncomplicated: Secondary | ICD-10-CM

## 2013-01-08 ENCOUNTER — Ambulatory Visit: Payer: Medicare Other | Admitting: Family Medicine

## 2013-01-08 VITALS — BP 130/82 | HR 86 | Temp 98.0°F | Resp 16 | Ht 67.25 in | Wt 209.0 lb

## 2013-01-08 DIAGNOSIS — Z0289 Encounter for other administrative examinations: Secondary | ICD-10-CM
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
## 2013-01-08 NOTE — Progress Notes (Signed)
  Subjective:    Patient ID: Casey Brennan, male    DOB: [birth year], 52 y.o.   MRN: [id number]  HPI    Review of Systems     Objective:   Physical Exam  Urine dip: Glucose-negative Blood-negative Protein-negative Specific gravity-1.015      Assessment & Plan:

## 2013-01-08 NOTE — Patient Instructions (Signed)
1 year card for respiratory symptoms/illness. Make sure you have not taken any sedating medicines prior to driving. Keep follow up with your primary care provider. Return to the clinic or go to the nearest emergency room if any of your symptoms worsen or new symptoms occur.

## 2013-01-08 NOTE — Progress Notes (Deleted)
[facility]:  Office Visit  Chief Complaint:  Chief Complaint  Patient presents with  . Employment Physical    self pay DOT    HPI: Casey Brennan is a 52 y.o. male who complains of  ***  Past Medical History  Diagnosis Date  . HIV (human immunodeficiency virus infection) 1988  . Pneumonia 07/12/10    SEPTIC SHOCK  . Severe frontal headaches 08/16/10  . Chest pain 07/12/10    pt states no hx of chest pain - 12/25/11  . Renal cyst   . Asthma   . Gastric ulcer   . Hematuria     negative w/u with Dr. Jory Sims.  Assumed passed stone.    Past Surgical History  Procedure Laterality Date  . Upper leg soft tissue biopsy      left thigh  . Knee arthroscopy      right  . Acne cyst removal    . Breast surgery      left retroareolar abscess i and d, biopsy  . Colonoscopy  12/26/2011    Procedure: COLONOSCOPY;  Surgeon: Shirley Friar, MD;  Location: [facility];  Service: Endoscopy;  Laterality: N/A;  . Esophagogastroduodenoscopy  12/26/2011    Procedure: ESOPHAGOGASTRODUODENOSCOPY (EGD);  Surgeon: Shirley Friar, MD;  Location: [facility];  Service: Endoscopy;  Laterality: N/A;   History   Social History  . Marital Status: Legally Separated    Spouse Name: N/A    Number of Children: N/A  . 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 Smoking status: Former Smoker    Types: Cigars    Quit date: 05/22/2010  . Smokeless tobacco: Never Used  . Alcohol Use: No     Comment: rare  . Drug Use: No  . Sexually Active: Not Currently    Birth Control/ Protection: Condom     Comment: pt. declined condoms   Other Topics Concern  . None   Social History Narrative   Sister April stays with him currently although he hopes to return to truck driving. 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 Sister is starting medical school next year.   Family History  Problem  Relation Age of Onset  . Stroke Father   . Diabetes Father   . Cancer Father     brain cancer  . Hypertension Sister   . Cancer Maternal Uncle     prostate cancer   Allergies  Allergen Reactions  . Penicillins Shortness Of Breath and Rash  . Sulfonamide Derivatives Shortness Of Breath and Rash   Prior to Admission medications   Medication Sig Start Date End Date Taking? Authorizing Provider  albuterol (PROVENTIL) (2.5 MG/3ML) 0.083% nebulizer solution USE 1 VIAL VIA NEBULIZER EVERY 6 HOURS AS NEEDED FOR WHEEZING 01/06/13  Yes Lorretta Harp, MD  Cholecalciferol 2000 UNITS TABS Take 1 tablet by mouth daily.   Yes Historical Provider, MD  cyanocobalamin (,VITAMIN B-12,) 1000 MCG/ML injection Inject 1,000 mcg into the muscle every 30 (thirty) days.   Yes Historical Provider, MD  Darunavir Ethanolate (PREZISTA) 800 MG tablet TAKE 1 TABLET BY MOUTH DAILY WITH NORVIR AND TRUVADA 11/18/12  Yes Judyann Munson, MD  diclofenac sodium (VOLTAREN) 1 % GEL Apply 2 g topically 2 (two) times daily as needed. For pain 10/18/12  Yes Judi Saa, DO  emtricitabine-tenofovir (TRUVADA) 200-300 MG per tablet  Take 1 tablet by mouth daily. TAKE WITH PREZISTA AND NORVIR. 11/18/12  Yes Judyann Munson, MD  folic acid (FOLVITE) 1 MG tablet Take 1 mg by mouth daily.   Yes Historical Provider, MD  gabapentin (NEURONTIN) 600 MG tablet Take 600 mg by mouth 4 (four) times daily.    Yes Historical Provider, MD  hydrOXYzine (VISTARIL) 25 MG capsule Take 25 mg by mouth 4 (four) times daily as needed. For itching 12/18/11  Yes Ginnie Smart, MD  ipratropium (ATROVENT) 0.02 % nebulizer solution Take 2.5 mLs (0.5 mg total) by nebulization 3 (three) times daily. 11/18/12  Yes Judyann Munson, MD  levalbuterol [facility] HFA) 45 MCG/ACT inhaler Inhale 1-2 puffs into the lungs every 4 (four) hours as needed for wheezing. 11/02/12  Yes Genelle Gather, MD  levalbuterol Pauline Aus) 0.63 MG/3ML nebulizer solution Take 3 mLs (0.63 mg total) by  nebulization 3 (three) times daily. 11/02/12  Yes Genelle Gather, MD  loratadine (CLARITIN) 10 MG tablet TAKE 1 TABLET BY MOUTH DAILY for seasonal allergies 11/18/12  Yes Judyann Munson, MD  mometasone (NASONEX) 50 MCG/ACT nasal spray Place 2 sprays into the nose daily. 2 sprays each nostril bid for 1 week. 11/27/12  Yes Ginnie Smart, MD  Multiple Vitamin (MULTIVITAMIN WITH MINERALS) TABS Take 1 tablet by mouth daily.   Yes Historical Provider, MD  mupirocin ointment (BACTROBAN) 2 % APPLY TOPICALLY TO THE AFFECTED AREA TWICE DAILY FOR 2 WEEKS. PLACE COTTON BETWEEN TOES TO ALLOW BETTER AERATION 12/24/12  Yes Ginnie Smart, MD  omeprazole (PRILOSEC) 40 MG capsule Take 40 mg by mouth daily.    Yes Historical Provider, MD  ondansetron (ZOFRAN) 4 MG tablet Take 1 tablet (4 mg total) by mouth every 8 (eight) hours as needed for nausea. 11/18/12  Yes Judyann Munson, MD  raltegravir (ISENTRESS) 400 MG tablet TAKE 1 TABLET BY MOUTH TWICE DAILY 11/18/12  Yes Judyann Munson, MD  ritonavir (NORVIR) 100 MG TABS TAKE 1 TABLET BY MOUTH DAILY WITH PREZISTA AND TRUVADA 11/18/12  Yes Judyann Munson, MD  sodium chloride (OCEAN) 0.65 % nasal spray Place 1 spray into the nose as needed for congestion. 11/21/12  Yes Almyra Deforest, MD  tizanidine (ZANAFLEX) 6 MG capsule Take 6 mg by mouth every 6 (six) hours.   Yes Historical Provider, MD  traZODone (DESYREL) 50 MG tablet Take 1 tablet (50 mg total) by mouth at bedtime. 12/12/12  Yes Lorretta Harp, MD  zolpidem (AMBIEN CR) 12.5 MG CR tablet Take 1 tablet (12.5 mg total) by mouth at bedtime as needed. For sleep 12/17/12  Yes Lorretta Harp, MD  dextromethorphan (TUSSIN COUGH) 15 MG/5ML syrup Take 10 mLs (30 mg total) by mouth 4 (four) times daily as needed for cough. 11/21/12   Almyra Deforest, MD     ROS: The patient denies fevers, chills, night sweats, unintentional weight loss, chest pain, palpitations, wheezing, dyspnea on exertion, nausea, vomiting, abdominal pain, dysuria,  hematuria, melena, numbness, weakness, or tingling.   All other systems have been reviewed and were otherwise negative with the exception of those mentioned in the HPI and as above.    PHYSICAL EXAM: Filed Vitals:   01/08/13 1521  BP: 130/82  Pulse: 86  Temp: 98 F (36.7 C)  Resp: 16   Filed Vitals:   01/08/13 1521  Height: 5' 7.25" (1.708 m)  Weight: 209 lb (94.802 kg)   Body mass index is 32.5 kg/(m^2).  General: Alert, no acute distress HEENT:  Normocephalic, atraumatic, oropharynx  patent.  Cardiovascular:  Regular rate and rhythm, no rubs murmurs or gallops.  No Carotid bruits, radial pulse intact. No pedal edema.  Respiratory: Clear to auscultation bilaterally.  No wheezes, rales, or rhonchi.  No cyanosis, no use of accessory musculature GI: No organomegaly, abdomen is soft and non-tender, positive bowel sounds.  No masses. Skin: No rashes. Neurologic: Facial musculature symmetric. Psychiatric: Patient is appropriate throughout our interaction. Lymphatic: No cervical lymphadenopathy Musculoskeletal: Gait intact.   LABS: Results for orders placed in visit on 11/18/12  HIV 1 RNA QUANT-NO REFLEX-BLD      Result Value Range   HIV 1 RNA Quant 105 (*) <20 copies/mL   HIV1 RNA Quant, Log 2.02 (*) <1.30 log 10  CBC      Result Value Range   WBC 6.8  4.0 - 10.5 K/uL   RBC 4.63  4.22 - 5.81 MIL/uL   Hemoglobin 14.1  13.0 - 17.0 g/dL   HCT 16.1  09.6 - 04.5 %   MCV 88.1  78.0 - 100.0 fL   MCH 30.5  26.0 - 34.0 pg   MCHC 34.6  30.0 - 36.0 g/dL   RDW 40.9  81.1 - 91.4 %   Platelets 241  150 - 400 K/uL  COMPREHENSIVE METABOLIC PANEL      Result Value Range   Sodium 139  135 - 145 mEq/L   Potassium 4.4  3.5 - 5.3 mEq/L   Chloride 104  96 - 112 mEq/L   CO2 28  19 - 32 mEq/L   Glucose, Bld 87  70 - 99 mg/dL   BUN 12  6 - 23 mg/dL   Creat 7.82  9.56 - 2.13 mg/dL   Total Bilirubin 0.4  0.3 - 1.2 mg/dL   Alkaline Phosphatase 103  39 - 117 U/L   AST 19  0 - 37 U/L   ALT  14  0 - 53 U/L   Total Protein 7.6  6.0 - 8.3 g/dL   Albumin 4.4  3.5 - 5.2 g/dL   Calcium 9.7  8.4 - 08.6 mg/dL  T-HELPER CELL (CD4)      Result Value Range   CD4 T Cell Abs 500  400 - 2700 cmm   CD4 % Helper T Cell 21 (*) 33 - 55 %     EKG/XRAY:   Primary read interpreted by Dr. Conley Rolls at [facility].   ASSESSMENT/PLAN: No diagnosis found.     Hamilton Capri PHUONG, DO 01/08/2013 3:42 PM

## 2013-01-16 ENCOUNTER — Encounter: Payer: Self-pay | Admitting: Family Medicine

## 2013-02-17 ENCOUNTER — Other Ambulatory Visit: Payer: Self-pay | Admitting: Oncology

## 2013-02-17 ENCOUNTER — Ambulatory Visit (HOSPITAL_COMMUNITY)
Admission: RE | Admit: 2013-02-17 | Discharge: 2013-02-17 | Disposition: A | Discharge status: Home / Self Care | Payer: Medicare Other | Source: Ambulatory Visit | Attending: Oncology | Admitting: Oncology

## 2013-02-17 ENCOUNTER — Telehealth: Payer: Self-pay | Admitting: Oncology

## 2013-02-17 ENCOUNTER — Telehealth: Payer: Self-pay | Admitting: *Deleted

## 2013-02-17 ENCOUNTER — Encounter (HOSPITAL_COMMUNITY): Payer: Self-pay

## 2013-02-17 ENCOUNTER — Encounter: Payer: Self-pay | Admitting: Nurse Practitioner

## 2013-02-17 DIAGNOSIS — C7951 Secondary malignant neoplasm of bone: Secondary | ICD-10-CM

## 2013-02-17 DIAGNOSIS — M899 Disorder of bone, unspecified: Secondary | ICD-10-CM | POA: Diagnosis not present

## 2013-02-17 DIAGNOSIS — K6289 Other specified diseases of anus and rectum: Secondary | ICD-10-CM | POA: Insufficient documentation

## 2013-02-17 DIAGNOSIS — M948X9 Other specified disorders of cartilage, unspecified sites: Secondary | ICD-10-CM | POA: Diagnosis not present

## 2013-02-17 DIAGNOSIS — M898X5 Other specified disorders of bone, thigh: Secondary | ICD-10-CM

## 2013-02-17 MED ORDER — IOHEXOL 300 MG/ML  SOLN
100.0000 mL | Freq: Once | INTRAMUSCULAR | Status: AC | PRN
  Administered 2013-02-17: 100 mL via INTRAVENOUS

## 2013-02-17 NOTE — Telephone Encounter (Signed)
Informed pt of appts this week w/ Nurse Practitioner canceled.  Keep appts as scheduled for lab and MD visit on 8/25 at 8:30 am.  He verbalized understanding.

## 2013-02-17 NOTE — Telephone Encounter (Signed)
Message copied by Wende Mott on Mon Feb 17, 2013  9:46 AM ------      Message from: Clenton Pare R      Created: Mon Feb 17, 2013  8:44 AM      Regarding: Appts       Cameo            This patient is on my schedule and Dr Alvina Filbert schedule within the same week. Per POF from Dr Gaylyn Rong at last visit, the visit this month is to be with MD not me. Can you please let pt know that we will cancel visit with me this week and he can keep appt with MD on 8/25. I will send POF to change this. He has a CT scan for review and I do not feel comfortable doing this as he may need further work-up and that is a more appropriate discussion to have with MD.            Carren Rang ------

## 2013-02-17 NOTE — Telephone Encounter (Signed)
per pof pt to see MD..sched with [facility] in error

## 2013-02-17 NOTE — Telephone Encounter (Signed)
s.w. pt and advised on that 8.20.14 appt cx per pof...pt aware of 8.25 est

## 2013-02-18 ENCOUNTER — Encounter: Payer: Self-pay | Admitting: Nurse Practitioner

## 2013-02-18 ENCOUNTER — Ambulatory Visit (INDEPENDENT_AMBULATORY_CARE_PROVIDER_SITE_OTHER): Payer: Medicare Other | Admitting: Nurse Practitioner

## 2013-02-18 VITALS — BP 111/77 | HR 82 | Ht 67.0 in | Wt 207.8 lb

## 2013-02-18 DIAGNOSIS — F0781 Postconcussional syndrome: Secondary | ICD-10-CM

## 2013-02-18 DIAGNOSIS — R51 Headache: Secondary | ICD-10-CM | POA: Diagnosis not present

## 2013-02-18 NOTE — Patient Instructions (Addendum)
Continue gabapentin at current dose Zanaflex when necessary Followup in 6 months and as necessary
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
## 2013-02-18 NOTE — Progress Notes (Signed)
I have read the note, and I agree with the clinical assessment and plan.  Abdurrahman Petersheim KEITH   

## 2013-02-19 ENCOUNTER — Other Ambulatory Visit: Payer: Medicare Other | Admitting: Lab

## 2013-02-19 ENCOUNTER — Ambulatory Visit: Payer: Medicare Other | Admitting: Oncology

## 2013-02-24 ENCOUNTER — Ambulatory Visit (HOSPITAL_BASED_OUTPATIENT_CLINIC_OR_DEPARTMENT_OTHER): Payer: Medicare Other | Admitting: Hematology and Oncology

## 2013-02-24 ENCOUNTER — Ambulatory Visit: Payer: Medicare Other

## 2013-02-24 ENCOUNTER — Telehealth: Payer: Self-pay | Admitting: Hematology and Oncology

## 2013-02-24 ENCOUNTER — Other Ambulatory Visit (HOSPITAL_BASED_OUTPATIENT_CLINIC_OR_DEPARTMENT_OTHER): Payer: Medicare Other

## 2013-02-24 VITALS — BP 133/92 | HR 78 | Temp 97.6°F | Resp 20 | Ht 67.0 in | Wt 206.6 lb

## 2013-02-24 DIAGNOSIS — M948X9 Other specified disorders of cartilage, unspecified sites: Secondary | ICD-10-CM

## 2013-02-24 DIAGNOSIS — E538 Deficiency of other specified B group vitamins: Secondary | ICD-10-CM | POA: Diagnosis not present

## 2013-02-24 DIAGNOSIS — M899 Disorder of bone, unspecified: Secondary | ICD-10-CM

## 2013-02-24 DIAGNOSIS — C7951 Secondary malignant neoplasm of bone: Secondary | ICD-10-CM

## 2013-02-24 DIAGNOSIS — M898X5 Other specified disorders of bone, thigh: Secondary | ICD-10-CM

## 2013-02-24 DIAGNOSIS — D649 Anemia, unspecified: Secondary | ICD-10-CM

## 2013-02-24 DIAGNOSIS — C801 Malignant (primary) neoplasm, unspecified: Secondary | ICD-10-CM | POA: Diagnosis not present

## 2013-02-24 LAB — COMPREHENSIVE METABOLIC PANEL (CC13)
ALT: 11 U/L (ref 0–55)
AST: 14 U/L (ref 5–34)
Albumin: 3.6 g/dL (ref 3.5–5.0)
Alkaline Phosphatase: 94 U/L (ref 40–150)
BUN: 11.9 mg/dL (ref 7.0–26.0)
CO2: 24 mEq/L (ref 22–29)
Calcium: 8.9 mg/dL (ref 8.4–10.4)
Chloride: 106 mEq/L (ref 98–109)
Creatinine: 1.3 mg/dL (ref 0.7–1.3)
Glucose: 121 mg/dl (ref 70–140)
Potassium: 3.4 mEq/L — ABNORMAL LOW (ref 3.5–5.1)
Sodium: 142 mEq/L (ref 136–145)
Total Bilirubin: 1.4 mg/dL — ABNORMAL HIGH (ref 0.20–1.20)
Total Protein: 7.1 g/dL (ref 6.4–8.3)

## 2013-02-24 LAB — CBC WITH DIFFERENTIAL/PLATELET
BASO%: 1.2 % (ref 0.0–2.0)
Basophils Absolute: 0.1 10*3/uL (ref 0.0–0.1)
EOS%: 10.5 % — ABNORMAL HIGH (ref 0.0–7.0)
Eosinophils Absolute: 0.6 10*3/uL — ABNORMAL HIGH (ref 0.0–0.5)
HCT: 36.1 % — ABNORMAL LOW (ref 38.4–49.9)
HGB: 11.8 g/dL — ABNORMAL LOW (ref 13.0–17.1)
LYMPH%: 33.4 % (ref 14.0–49.0)
MCH: 32.3 pg (ref 27.2–33.4)
MCHC: 32.8 g/dL (ref 32.0–36.0)
MCV: 98.4 fL — ABNORMAL HIGH (ref 79.3–98.0)
MONO#: 0.4 10*3/uL (ref 0.1–0.9)
MONO%: 7.7 % (ref 0.0–14.0)
NEUT#: 2.6 10*3/uL (ref 1.5–6.5)
NEUT%: 47.2 % (ref 39.0–75.0)
Platelets: 149 10*3/uL (ref 140–400)
RBC: 3.67 10*6/uL — ABNORMAL LOW (ref 4.20–5.82)
RDW: 17.8 % — ABNORMAL HIGH (ref 11.0–14.6)
WBC: 5.5 10*3/uL (ref 4.0–10.3)
lymph#: 1.8 10*3/uL (ref 0.9–3.3)

## 2013-02-24 LAB — IRON AND TIBC CHCC
%SAT: 29 % (ref 20–55)
Iron: 69 ug/dL (ref 42–163)
TIBC: 239 ug/dL (ref 202–409)
UIBC: 170 ug/dL (ref 117–376)

## 2013-02-24 LAB — FERRITIN CHCC: Ferritin: 120 ng/ml (ref 22–316)

## 2013-02-24 NOTE — Telephone Encounter (Signed)
gv and printed appt sched and avs for pt...sent pt back to lab

## 2013-02-24 NOTE — Telephone Encounter (Signed)
gv and printed appt sched and avs for pt  °

## 2013-02-24 NOTE — Progress Notes (Signed)
ID: Leafy Half OB: [birth year]  MR#: [id number]  WUJ#:[id number]   [facility]  [phone]) [phone] [phone]   OFFICE PROGRESS NOTE  PCP: Lorretta Harp, MD  DIAGNOSIS: Lytic bone lesion in right hip; negative biopsy.   CURRENT THERAPY: Pending work up.   INTERVAL HISTORY:  Casey Brennan 52 y.o. male returns for follow up with visit. His appetite is good but he lost couple of pounds. He reported that he was admitted to hospital for pneumonia. He has clear nasal discharge. The patient denied fever, chills, night sweats. He denied headaches, double vision, blurry vision, nasal congestion,  hearing problems, odynophagia or dysphagia. No chest pain, palpitations, dyspnea, cough, abdominal pain, nausea, vomiting, diarrhea, constipation, hematochezia. The patient denied dysuria, nocturia, polyuria, hematuria, myalgia, numbness, tingling, psychiatric problems.  Review of Systems  Constitutional: Positive for weight loss. Negative for fever, chills, malaise/fatigue and diaphoresis.  HENT: Negative for hearing loss, ear pain, nosebleeds, congestion, sore throat, neck pain and tinnitus.   Eyes: Negative for blurred vision, double vision, photophobia, pain and discharge.  Respiratory: Negative for cough, hemoptysis, sputum production, shortness of breath and wheezing.   Cardiovascular: Negative for chest pain, palpitations, orthopnea, leg swelling and PND.  Gastrointestinal: Negative for heartburn, nausea, vomiting, abdominal pain, diarrhea, constipation and blood in stool.  Genitourinary: Negative for dysuria, urgency, frequency and hematuria.  Musculoskeletal: Negative for myalgias, back pain and joint pain.  Skin: Negative for itching and rash.  Neurological: Negative for dizziness, tingling, sensory change, seizures, loss of consciousness, weakness and headaches.  Endo/Heme/Allergies: Does not bruise/bleed easily.  Psychiatric/Behavioral: Negative.     PAST MEDICAL  HISTORY: Past Medical History  Diagnosis Date  . HIV (human immunodeficiency virus infection) 1988  . Pneumonia 07/12/10    SEPTIC SHOCK  . Severe frontal headaches 08/16/10  . Chest pain 07/12/10    pt states no hx of chest pain - 12/25/11  . Renal cyst   . Asthma   . Gastric ulcer   . Hematuria     negative w/u with Dr. Jory Sims.  Assumed passed stone.     PAST SURGICAL HISTORY: Past Surgical History  Procedure Laterality Date  . Upper leg soft tissue biopsy      left thigh  . Knee arthroscopy      right  . Acne cyst removal    . Breast surgery      left retroareolar abscess i and d, biopsy  . Colonoscopy  12/26/2011    Procedure: COLONOSCOPY;  Surgeon: Shirley Friar, MD;  Location: [facility];  Service: Endoscopy;  Laterality: N/A;  . Esophagogastroduodenoscopy  12/26/2011    Procedure: ESOPHAGOGASTRODUODENOSCOPY (EGD);  Surgeon: Shirley Friar, MD;  Location: [facility];  Service: Endoscopy;  Laterality: N/A;    FAMILY HISTORY Family History  Problem Relation Age of Onset  . Stroke Father   . Diabetes Father   . Cancer Father     brain cancer  . Hypertension Sister   . Cancer Maternal Uncle     prostate cancer    HEALTH MAINTENANCE: History  Substance Use Topics  . Smoking status: Former Smoker    Types: Cigars    Quit date: 05/22/2010  . Smokeless tobacco: Never Used  . Alcohol Use: No     Comment: rare    Allergies  Allergen Reactions  . Penicillins Shortness Of Breath and Rash  . Sulfonamide Derivatives Shortness Of Breath and Rash    Current Outpatient Prescriptions  Medication Sig Dispense Refill  .  Cholecalciferol 2000 UNITS TABS Take 1 tablet by mouth daily.      . cyanocobalamin (,VITAMIN B-12,) 1000 MCG/ML injection Inject 1,000 mcg into the muscle every 30 (thirty) days.      . Darunavir Ethanolate (PREZISTA) 800 MG tablet TAKE 1 TABLET BY MOUTH DAILY WITH NORVIR AND TRUVADA  30 tablet  6  . emtricitabine-tenofovir (TRUVADA)  200-300 MG per tablet Take 1 tablet by mouth daily. TAKE WITH PREZISTA AND NORVIR.  30 tablet  6  . folic acid (FOLVITE) 1 MG tablet Take 1 mg by mouth daily.      [name] gabapentin (NEURONTIN) 600 MG tablet Take 600 mg by mouth 4 (four) times daily as needed.       [name] ipratropium (ATROVENT) 0.02 % nebulizer solution Take 2.5 mLs (0.5 mg total) by nebulization 3 (three) times daily.  75 mL  6  . levalbuterol (XOPENEX HFA) 45 MCG/ACT inhaler Inhale 1-2 puffs into the lungs every 4 (four) hours as needed for wheezing.  1 Inhaler  12  . levalbuterol (XOPENEX) 0.63 MG/3ML nebulizer solution Take 3 mLs (0.63 mg total) by nebulization 3 (three) times daily.  3 mL  6  . loratadine (CLARITIN) 10 MG tablet TAKE 1 TABLET BY MOUTH DAILY for seasonal allergies  30 tablet  6  . Multiple Vitamin (MULTIVITAMIN WITH MINERALS) TABS Take 1 tablet by mouth daily.      [name] omeprazole (PRILOSEC) 40 MG capsule Take 40 mg by mouth daily.       . ondansetron (ZOFRAN) 4 MG tablet Take 1 tablet (4 mg total) by mouth every 8 (eight) hours as needed for nausea.  30 tablet  6  . QVAR 80 MCG/ACT inhaler       . raltegravir (ISENTRESS) 400 MG tablet TAKE 1 TABLET BY MOUTH TWICE DAILY  60 tablet  6  . ritonavir (NORVIR) 100 MG TABS TAKE 1 TABLET BY MOUTH DAILY WITH PREZISTA AND TRUVADA  30 tablet  6  . tizanidine (ZANAFLEX) 6 MG capsule Take 6 mg by mouth 3 (three) times daily as needed.       . traZODone (DESYREL) 50 MG tablet Take 1 tablet (50 mg total) by mouth at bedtime.  30 tablet  3  . zolpidem (AMBIEN CR) 12.5 MG CR tablet Take 1 tablet (12.5 mg total) by mouth at bedtime as needed. For sleep  30 tablet  0  . [DISCONTINUED] famotidine (PEPCID AC) 10 MG tablet One po bid x 3 days and then prn upper abdominal pain  20 tablet  0   No current facility-administered medications for this visit.    OBJECTIVE: Filed Vitals:   02/24/13 0945  BP: 133/92  Pulse: 78  Temp: 97.6 F (36.4 C)  Resp: 20     Body mass index is 32.35  kg/(m^2).    ECOG FS:1  PHYSICAL EXAMINATION:  HEENT: Sclerae anicteric.  Conjunctivae were pink. Pupils round and reactive bilaterally. Oral mucosa is moist without ulceration or thrush. No occipital, submandibular, cervical, supraclavicular or axillar adenopathy. Lungs: clear to auscultation without wheezes. No rales or rhonchi. Heart: regular rate and rhythm. No murmur, gallop or rubs. Abdomen: soft, non tender. No guarding or rebound tenderness. Bowel sounds are present. No palpable hepatosplenomegaly. MSK: no focal spinal tenderness. Extremities: No clubbing or cyanosis.No calf tenderness to palpitation, no peripheral edema. The patient had grossly intact strength in upper and lower extremities. Skin exam was without ecchymosis, petechiae. Neuro: non-focal, alert and oriented to time, person and place,  appropriate affect  LAB RESULTS:  CMP     Component Value Date/Time   NA 142 02/24/2013 0848   NA 139 11/18/2012 1712   K 3.4* 02/24/2013 0848   K 4.4 11/18/2012 1712   CL 104 11/18/2012 1712   CO2 24 02/24/2013 0848   CO2 28 11/18/2012 1712   GLUCOSE 121 02/24/2013 0848   GLUCOSE 87 11/18/2012 1712   GLUCOSE 93 03/25/2009   BUN 11.9 02/24/2013 0848   BUN 12 11/18/2012 1712   CREATININE 1.3 02/24/2013 0848   CREATININE 1.29 11/18/2012 1712   CREATININE 1.28 11/02/2012 0807   CALCIUM 8.9 02/24/2013 0848   CALCIUM 9.7 11/18/2012 1712   PROT 7.1 02/24/2013 0848   PROT 7.6 11/18/2012 1712   ALBUMIN 3.6 02/24/2013 0848   ALBUMIN 4.4 11/18/2012 1712   AST 14 02/24/2013 0848   AST 19 11/18/2012 1712   ALT 11 02/24/2013 0848   ALT 14 11/18/2012 1712   ALKPHOS 94 02/24/2013 0848   ALKPHOS 103 11/18/2012 1712   BILITOT 1.40* 02/24/2013 0848   BILITOT 0.4 11/18/2012 1712   GFRNONAA 64* 11/02/2012 0807   GFRAA 74* 11/02/2012 0807    Lab Results  Component Value Date   WBC 5.5 02/24/2013   NEUTROABS 2.6 02/24/2013   HGB 11.8* 02/24/2013   HCT 36.1* 02/24/2013   MCV 98.4* 02/24/2013   PLT 149 02/24/2013       Chemistry      Component Value Date/Time   NA 142 02/24/2013 0848   NA 139 11/18/2012 1712   K 3.4* 02/24/2013 0848   K 4.4 11/18/2012 1712   CL 104 11/18/2012 1712   CO2 24 02/24/2013 0848   CO2 28 11/18/2012 1712   BUN 11.9 02/24/2013 0848   BUN 12 11/18/2012 1712   CREATININE 1.3 02/24/2013 0848   CREATININE 1.29 11/18/2012 1712   CREATININE 1.28 11/02/2012 0807      Component Value Date/Time   CALCIUM 8.9 02/24/2013 0848   CALCIUM 9.7 11/18/2012 1712   ALKPHOS 94 02/24/2013 0848   ALKPHOS 103 11/18/2012 1712   AST 14 02/24/2013 0848   AST 19 11/18/2012 1712   ALT 11 02/24/2013 0848   ALT 14 11/18/2012 1712   BILITOT 1.40* 02/24/2013 0848   BILITOT 0.4 11/18/2012 1712      STUDIES: Ct Pelvis W Contrast  02/17/2013   *RADIOLOGY REPORT*  Clinical Data:  lytic lesion right pevic bone, evaluate for progression  CT PELVIS WITH CONTRAST  Technique:  Multidetector CT imaging of the  pelvis was performed using the standard protocol following bolus administration of intravenous contrast.  Contrast: OMNIPAQUE IOHEXOL 300 MG/ML  SOLN  Comparison:  CT scan 08/02/2012 and bone scan 08/16/2012  Findings:  The left iliac bone again demonstrates a lytic bone lesion.  It measures 9 mm and well-defined borders.  It is seen best on image number 47 of series 4 and does not appear to be significantly changed.  A 3 mm lytic lesion involving the left L5 superior facet is also stable and given the position likely to be of degenerative origin.  An oval approximately 1 cm sclerotic lesion in the right iliac bone is unchanged.  No other definite lesions are identified.  On image number 95, the posterior right iliac bone shows to very subtle areas of relative lucency.  The anterior iliac bone on image number 96 shows another focus of very subtle lucency.  On image number 67 the left iliac bone shows an  area of subtle lucency both anteriorly and posteriorly.  Within the pelvis, there is no mass, free fluid, or significant  adenopathy.  There is mild rectal wall thickening.  IMPRESSION:  1.  The lytic lesion of interest in the left iliac bone is stable. There is also a tiny lytic lesion in the L5 left superior facet, possibly degenerative, which is stable, and the sclerotic lesion in the right iliac bone is stable. 2.  There are other scattered tiny foci of relative lucency in the iliac bones.  These are likely seen to better advantage on today's study, performed with bone algorithm and with thin reconstructions. Extrapolate these findings to the prior CT scan, it appears that there has been little if any change as a subtle evidence of these findings can be seen previously. 3.  Similar to prior study, there is a rectal wall thickening.   Original Report Authenticated By: Esperanza Heir, M.D.    ASSESSMENT AND PLAN: 1. Solitary lytic bone lesion. - Workup: Bone biopsy was negative for cancer.  -  followup CT pelvis (02/17/13): 1.  The lytic lesion of interest in the left iliac bone is stable. There is also a tiny lytic lesion in the L5 left superior facet, possibly degenerative, which is stable, and the sclerotic lesion in the right iliac bone is stable. 2.  There are other scattered tiny foci of relative lucency in the iliac bones.  These are likely seen to better advantage on today's study, performed with bone algorithm and with thin reconstructions. Extrapolate these findings to the prior CT scan, it appears that there has been little if any change as a subtle evidence of these findings can be seen previously. 3.  Similar to prior study, there is a rectal wall thickening. - To complete work up, Dr. Gaylyn Rong recommended PET scan which not done. He will arrange PET scan to clarify activity reported on CT scan lesions. Mr. Withem and his sister expressed informed understanding and agreed with the recommendation.  2. Anemia, grade 1 with h/o folic acid deficiency and vitamin B12 deficiency. We will continue folic acid and vitamin B12.  We will check haptoglobin, DAT, iron panel 3. Follow up: In 6 months but sooner if PET scan is positive.  The length of time of the face-to-face encounter was 25 minutes. More than 50% of time was spent counseling and coordination of care.   Myra Rude, MD   02/24/2013 10:33 AM

## 2013-02-25 LAB — KAPPA/LAMBDA LIGHT CHAINS
Kappa free light chain: 2.86 mg/dL — ABNORMAL HIGH (ref 0.33–1.94)
Kappa:Lambda Ratio: 1.06 (ref 0.26–1.65)
Lambda Free Lght Chn: 2.71 mg/dL — ABNORMAL HIGH (ref 0.57–2.63)

## 2013-02-25 LAB — HAPTOGLOBIN: Haptoglobin: <25 mg/dL — ABNORMAL LOW (ref 45–215)

## 2013-02-25 LAB — DIRECT ANTIGLOBULIN TEST (NOT AT ARMC)
DAT (Complement): NEGATIVE
DAT IgG: NEGATIVE

## 2013-02-26 LAB — PROTEIN ELECTROPHORESIS, SERUM
Albumin ELP: 58 % (ref 55.8–66.1)
Alpha-1-Globulin: 4.8 % (ref 2.9–4.9)
Alpha-2-Globulin: 7.6 % (ref 7.1–11.8)
Beta 2: 5.6 % (ref 3.2–6.5)
Beta Globulin: 4.2 % — ABNORMAL LOW (ref 4.7–7.2)
Gamma Globulin: 19.8 % — ABNORMAL HIGH (ref 11.1–18.8)
Total Protein, Serum Electrophoresis: 7 g/dL (ref 6.0–8.3)

## 2013-02-28 ENCOUNTER — Encounter (HOSPITAL_COMMUNITY): Payer: Self-pay | Admitting: Emergency Medicine

## 2013-02-28 ENCOUNTER — Emergency Department (HOSPITAL_COMMUNITY)
Admission: EM | Admit: 2013-02-28 | Discharge: 2013-02-28 | Disposition: A | Discharge status: Home / Self Care | Payer: Medicare Other | Attending: Emergency Medicine | Admitting: Emergency Medicine

## 2013-02-28 ENCOUNTER — Emergency Department (HOSPITAL_COMMUNITY): Payer: Medicare Other

## 2013-02-28 DIAGNOSIS — R319 Hematuria, unspecified: Secondary | ICD-10-CM | POA: Insufficient documentation

## 2013-02-28 DIAGNOSIS — K8020 Calculus of gallbladder without cholecystitis without obstruction: Secondary | ICD-10-CM | POA: Diagnosis not present

## 2013-02-28 DIAGNOSIS — Z9889 Other specified postprocedural states: Secondary | ICD-10-CM | POA: Diagnosis not present

## 2013-02-28 DIAGNOSIS — Z8711 Personal history of peptic ulcer disease: Secondary | ICD-10-CM | POA: Diagnosis not present

## 2013-02-28 DIAGNOSIS — J45909 Unspecified asthma, uncomplicated: Secondary | ICD-10-CM | POA: Diagnosis not present

## 2013-02-28 DIAGNOSIS — Z21 Asymptomatic human immunodeficiency virus [HIV] infection status: Secondary | ICD-10-CM | POA: Insufficient documentation

## 2013-02-28 DIAGNOSIS — Z8669 Personal history of other diseases of the nervous system and sense organs: Secondary | ICD-10-CM | POA: Insufficient documentation

## 2013-02-28 DIAGNOSIS — Z880 Allergy status to penicillin: Secondary | ICD-10-CM | POA: Diagnosis not present

## 2013-02-28 DIAGNOSIS — Z87448 Personal history of other diseases of urinary system: Secondary | ICD-10-CM | POA: Insufficient documentation

## 2013-02-28 DIAGNOSIS — Z8701 Personal history of pneumonia (recurrent): Secondary | ICD-10-CM | POA: Diagnosis not present

## 2013-02-28 DIAGNOSIS — Z87891 Personal history of nicotine dependence: Secondary | ICD-10-CM | POA: Insufficient documentation

## 2013-02-28 DIAGNOSIS — Z79899 Other long term (current) drug therapy: Secondary | ICD-10-CM | POA: Insufficient documentation

## 2013-02-28 LAB — CBC WITH DIFFERENTIAL/PLATELET
Basophils Absolute: 0 10*3/uL (ref 0.0–0.1)
Basophils Relative: 1 % (ref 0–1)
Eosinophils Absolute: 0.4 10*3/uL (ref 0.0–0.7)
Eosinophils Relative: 6 % — ABNORMAL HIGH (ref 0–5)
HCT: 30.6 % — ABNORMAL LOW (ref 39.0–52.0)
Hemoglobin: 10.3 g/dL — ABNORMAL LOW (ref 13.0–17.0)
Lymphocytes Relative: 25 % (ref 12–46)
Lymphs Abs: 1.9 10*3/uL (ref 0.7–4.0)
MCH: 31.9 pg (ref 26.0–34.0)
MCHC: 33.7 g/dL (ref 30.0–36.0)
MCV: 94.7 fL (ref 78.0–100.0)
Monocytes Absolute: 0.5 10*3/uL (ref 0.1–1.0)
Monocytes Relative: 6 % (ref 3–12)
Neutro Abs: 4.8 10*3/uL (ref 1.7–7.7)
Neutrophils Relative %: 63 % (ref 43–77)
Platelets: 219 10*3/uL (ref 150–400)
RBC: 3.23 MIL/uL — ABNORMAL LOW (ref 4.22–5.81)
RDW: 16.3 % — ABNORMAL HIGH (ref 11.5–15.5)
WBC: 7.6 10*3/uL (ref 4.0–10.5)

## 2013-02-28 LAB — URINALYSIS, ROUTINE W REFLEX MICROSCOPIC
Glucose, UA: NEGATIVE mg/dL
Ketones, ur: NEGATIVE mg/dL
Nitrite: NEGATIVE
Protein, ur: >300 mg/dL — AB
Specific Gravity, Urine: 1.024 (ref 1.005–1.030)
Urobilinogen, UA: 0.2 mg/dL (ref 0.0–1.0)
pH: 6 (ref 5.0–8.0)

## 2013-02-28 LAB — URINE MICROSCOPIC-ADD ON

## 2013-02-28 LAB — BASIC METABOLIC PANEL
BUN: 10 mg/dL (ref 6–23)
CO2: 25 mEq/L (ref 19–32)
Calcium: 8.6 mg/dL (ref 8.4–10.5)
Chloride: 105 mEq/L (ref 96–112)
Creatinine, Ser: 1.23 mg/dL (ref 0.50–1.35)
GFR calc Af Amer: 78 mL/min — ABNORMAL LOW (ref >90)
GFR calc non Af Amer: 67 mL/min — ABNORMAL LOW (ref >90)
Glucose, Bld: 111 mg/dL — ABNORMAL HIGH (ref 70–99)
Potassium: 4.4 mEq/L (ref 3.5–5.1)
Sodium: 135 mEq/L (ref 135–145)

## 2013-02-28 LAB — PROTIME-INR
INR: 1.02 (ref 0.00–1.49)
Prothrombin Time: 13.2 seconds (ref 11.6–15.2)

## 2013-02-28 MED ORDER — IOHEXOL 300 MG/ML  SOLN
100.0000 mL | Freq: Once | INTRAMUSCULAR | Status: AC | PRN
  Administered 2013-02-28: 100 mL via INTRAVENOUS

## 2013-02-28 MED ORDER — ONDANSETRON HCL 4 MG/2ML IJ SOLN
4.0000 mg | Freq: Once | INTRAMUSCULAR | Status: AC
  Administered 2013-02-28: 4 mg via INTRAVENOUS
  Filled 2013-02-28: qty 2

## 2013-02-28 MED ORDER — DIPHENHYDRAMINE HCL 50 MG/ML IJ SOLN
25.0000 mg | Freq: Once | INTRAMUSCULAR | Status: AC
  Administered 2013-02-28: 25 mg via INTRAVENOUS
  Filled 2013-02-28: qty 1

## 2013-02-28 MED ORDER — MORPHINE SULFATE 4 MG/ML IJ SOLN
4.0000 mg | Freq: Once | INTRAMUSCULAR | Status: AC
  Administered 2013-02-28: 4 mg via INTRAVENOUS
  Filled 2013-02-28: qty 1

## 2013-02-28 MED ORDER — LIDOCAINE HCL 2 % EX GEL
Freq: Once | CUTANEOUS | Status: AC
  Administered 2013-02-28: 10 via URETHRAL
  Filled 2013-02-28: qty 10

## 2013-02-28 NOTE — ED Provider Notes (Signed)
CSN: [id number]     Arrival date & time 02/28/13  1500 History   First MD Initiated Contact with Patient 02/28/13 1518     Chief Complaint  Patient presents with  . Hematuria   (Consider location/radiation/quality/duration/timing/severity/associated sxs/prior Treatment) HPI Comments: Patient presents to the ER for evaluation of hematuria. Patient reports the symptoms began this morning. Patient has been passing large clots and bright red blood when he urinates as well as leakage of clots and blood when he is not urinating. Patient reports that he had an episode of hematuria earlier in the year which was worked up by Engineer, building services. Patient and significant other report that "cancer was ruled out".  Patient does report that he had a fall 2 days ago. He was mowing the lawn, slipped and fell. He did strike his perineum on a stake that was ground. He denies pain after the injury. He has not had any swelling of his testicles. He still denies any pain in the area. Bleeding did not start until the following day.  Patient is a 52 y.o. male presenting with hematuria.  Hematuria    Past Medical History  Diagnosis Date  . HIV (human immunodeficiency virus infection) 1988  . Pneumonia 07/12/10    SEPTIC SHOCK  . Severe frontal headaches 08/16/10  . Chest pain 07/12/10    pt states no hx of chest pain - 12/25/11  . Renal cyst   . Asthma   . Gastric ulcer   . Hematuria     negative w/u with Dr. Jory Sims.  Assumed passed stone.    Past Surgical History  Procedure Laterality Date  . Upper leg soft tissue biopsy      left thigh  . Knee arthroscopy      right  . Acne cyst removal    . Breast surgery      left retroareolar abscess i and d, biopsy  . Colonoscopy  12/26/2011    Procedure: COLONOSCOPY;  Surgeon: Shirley Friar, MD;  Location: [facility];  Service: Endoscopy;  Laterality: N/A;  . Esophagogastroduodenoscopy  12/26/2011    Procedure: ESOPHAGOGASTRODUODENOSCOPY (EGD);   Surgeon: Shirley Friar, MD;  Location: [facility];  Service: Endoscopy;  Laterality: N/A;   Family History  Problem Relation Age of Onset  . Stroke Father   . Diabetes Father   . Cancer Father     brain cancer  . Hypertension Sister   . Cancer Maternal Uncle     prostate cancer   History  Substance Use Topics  . Smoking status: Former Smoker    Types: Cigars    Quit date: 05/22/2010  . Smokeless tobacco: Never Used  . Alcohol Use: No     Comment: rare    Review of Systems  Respiratory: Negative.   Cardiovascular: Negative.   Genitourinary: Positive for hematuria.  All other systems reviewed and are negative.    Allergies  Penicillins and Sulfonamide derivatives  Home Medications   Current Outpatient Rx  Name  Route  Sig  Dispense  Refill  . Cholecalciferol 2000 UNITS TABS   Oral   Take 1 tablet by mouth daily.         . Darunavir Ethanolate (PREZISTA) 800 MG tablet   Oral   Take 800 mg by mouth at bedtime. Take with norvir and truvada         . emtricitabine-tenofovir (TRUVADA) 200-300 MG per tablet   Oral   Take 1 tablet by mouth daily. TAKE WITH PREZISTA  AND NORVIR.   30 tablet   6   . folic acid (FOLVITE) 1 MG tablet   Oral   Take 1 mg by mouth daily.         [name] gabapentin (NEURONTIN) 600 MG tablet   Oral   Take 600 mg by mouth 4 (four) times daily as needed.          [name] ipratropium (ATROVENT) 0.02 % nebulizer solution   Nebulization   Take 2.5 mLs (0.5 mg total) by nebulization 3 (three) times daily.   75 mL   6   . levalbuterol (XOPENEX HFA) 45 MCG/ACT inhaler   Inhalation   Inhale 1-2 puffs into the lungs every 4 (four) hours as needed for wheezing.   1 Inhaler   12   . levalbuterol (XOPENEX) 0.63 MG/3ML nebulizer solution   Nebulization   Take 3 mLs (0.63 mg total) by nebulization 3 (three) times daily.   3 mL   6   . loratadine (CLARITIN) 10 MG tablet      TAKE 1 TABLET BY MOUTH DAILY for seasonal allergies   30  tablet   6   . Multiple Vitamin (MULTIVITAMIN WITH MINERALS) TABS   Oral   Take 1 tablet by mouth daily.         [name] omeprazole (PRILOSEC) 40 MG capsule   Oral   Take 40 mg by mouth daily.          . ondansetron (ZOFRAN) 4 MG tablet   Oral   Take 1 tablet (4 mg total) by mouth every 8 (eight) hours as needed for nausea.   30 tablet   6   . QVAR 80 MCG/ACT inhaler   Inhalation   Inhale 1 puff into the lungs daily as needed (wheezing).          . raltegravir (ISENTRESS) 400 MG tablet   Oral   Take 400 mg by mouth at bedtime.         . ritonavir (NORVIR) 100 MG TABS      TAKE 1 TABLET BY MOUTH DAILY WITH PREZISTA AND TRUVADA   30 tablet   6   . tizanidine (ZANAFLEX) 6 MG capsule   Oral   Take 6 mg by mouth 3 (three) times daily as needed.          . traZODone (DESYREL) 50 MG tablet   Oral   Take 1 tablet (50 mg total) by mouth at bedtime.   30 tablet   3   . zolpidem (AMBIEN CR) 12.5 MG CR tablet   Oral   Take 1 tablet (12.5 mg total) by mouth at bedtime as needed. For sleep   30 tablet   0    BP 96/64  Pulse 104  Temp(Src) 98.6 F (37 C) (Oral)  Resp 14  Ht 5\' 9"  (1.753 m)  Wt 206 lb (93.441 kg)  BMI 30.41 kg/m2  SpO2 91% Physical Exam  Constitutional: He is oriented to person, place, and time. He appears well-developed and well-nourished. No distress.  HENT:  Head: Normocephalic and atraumatic.  Right Ear: Hearing normal.  Left Ear: Hearing normal.  Nose: Nose normal.  Mouth/Throat: Oropharynx is clear and moist and mucous membranes are normal.  Eyes: Conjunctivae and EOM are normal. Pupils are equal, round, and reactive to light.  Neck: Normal range of motion. Neck supple.  Cardiovascular: Regular rhythm, S1 normal and S2 normal.  Exam reveals no gallop and no friction rub.   No  murmur heard. Pulmonary/Chest: Effort normal and breath sounds normal. No respiratory distress. He exhibits no tenderness.  Abdominal: Soft. Normal appearance and  bowel sounds are normal. There is no hepatosplenomegaly. There is no tenderness. There is no rebound, no guarding, no tenderness at McBurney's point and negative Murphy's sign. No hernia.  Musculoskeletal: Normal range of motion.  Neurological: He is alert and oriented to person, place, and time. He has normal strength. No cranial nerve deficit or sensory deficit. Coordination normal. GCS eye subscore is 4. GCS verbal subscore is 5. GCS motor subscore is 6.  Skin: Skin is warm, dry and intact. No rash noted. No cyanosis.  Psychiatric: He has a normal mood and affect. His speech is normal and behavior is normal. Thought content normal.    ED Course  Procedures (including critical care time) Labs Review Labs Reviewed  URINALYSIS, ROUTINE W REFLEX MICROSCOPIC  CBC WITH DIFFERENTIAL  BASIC METABOLIC PANEL  PROTIME-INR   Imaging Review No results found.  MDM  Diagnosis: Hematuria  16:07 Patient tells me that he has contacted Dr. Sherron Monday and was told to go to the ER. Significant other indicated that she spoke with Dr. Mina Marble nurse earlier and was told to have him paged when the patient arrived in the ER. I was unable to get in touch with Dr. Sherron Monday.  Workup was performed. Patient had normal blood work. Because of the hematuria and the recent trauma, although mild, CT scan of abdomen and pelvis with and without contrast was performed to evaluate urethra as well as intra-abdominal and pelvic organs for injury and source of bleeding. Study was. Patient passed a large clot. The ER and there was no further bleeding. He was able to pass normal urine afterwards without any blood in it patient is feeling better. He will be discharged to home, followup with urology as needed. Return if his symptoms worsen.   Gilda Crease, MD 02/28/13 2020

## 2013-02-28 NOTE — ED Notes (Signed)
Called Dr. Blinda Leatherwood to verify about performing a in and out cath on pt. Dr. Blinda Leatherwood wanted to hold off on the in and out until the CT scan comes back. Nurse was notified.

## 2013-02-28 NOTE — ED Notes (Addendum)
Pt presents with chief compliant of blood in his urine. Pt reports this episode started yesterday afternoon. Pt is a patient of [facility] since earlier in the year with similar symptoms. Family reports that [facility] did rule out cancer. Family states that they called the office and was instructed to present to the ED, and the office requested to be paged upon pt arrival. Pt reports feeling tired and having generalized weakness.

## 2013-03-02 LAB — URINE CULTURE: Colony Count: 4000

## 2013-03-07 DIAGNOSIS — R310 Gross hematuria: Secondary | ICD-10-CM | POA: Diagnosis not present

## 2013-03-25 DIAGNOSIS — Y289XXA Contact with unspecified sharp object, undetermined intent, initial encounter: Secondary | ICD-10-CM | POA: Diagnosis not present

## 2013-03-25 DIAGNOSIS — R310 Gross hematuria: Secondary | ICD-10-CM | POA: Diagnosis not present

## 2013-03-25 DIAGNOSIS — Z125 Encounter for screening for malignant neoplasm of prostate: Secondary | ICD-10-CM | POA: Diagnosis not present

## 2013-03-31 ENCOUNTER — Encounter (HOSPITAL_COMMUNITY)
Admission: RE | Admit: 2013-03-31 | Discharge: 2013-03-31 | Disposition: A | Discharge status: Home / Self Care | Payer: Medicare Other | Source: Ambulatory Visit | Attending: Hematology and Oncology | Admitting: Hematology and Oncology

## 2013-03-31 DIAGNOSIS — Z21 Asymptomatic human immunodeficiency virus [HIV] infection status: Secondary | ICD-10-CM | POA: Diagnosis not present

## 2013-03-31 DIAGNOSIS — R948 Abnormal results of function studies of other organs and systems: Secondary | ICD-10-CM | POA: Insufficient documentation

## 2013-03-31 DIAGNOSIS — M899 Disorder of bone, unspecified: Secondary | ICD-10-CM | POA: Diagnosis not present

## 2013-03-31 DIAGNOSIS — N281 Cyst of kidney, acquired: Secondary | ICD-10-CM | POA: Insufficient documentation

## 2013-03-31 DIAGNOSIS — R599 Enlarged lymph nodes, unspecified: Secondary | ICD-10-CM | POA: Insufficient documentation

## 2013-03-31 DIAGNOSIS — M948X9 Other specified disorders of cartilage, unspecified sites: Secondary | ICD-10-CM | POA: Insufficient documentation

## 2013-03-31 DIAGNOSIS — Q019 Encephalocele, unspecified: Secondary | ICD-10-CM | POA: Diagnosis not present

## 2013-03-31 DIAGNOSIS — K8020 Calculus of gallbladder without cholecystitis without obstruction: Secondary | ICD-10-CM | POA: Diagnosis not present

## 2013-03-31 DIAGNOSIS — R972 Elevated prostate specific antigen [PSA]: Secondary | ICD-10-CM | POA: Insufficient documentation

## 2013-03-31 DIAGNOSIS — M898X5 Other specified disorders of bone, thigh: Secondary | ICD-10-CM

## 2013-03-31 LAB — GLUCOSE, CAPILLARY: Glucose-Capillary: 99 mg/dL (ref 70–99)

## 2013-03-31 MED ORDER — FLUDEOXYGLUCOSE F - 18 (FDG) INJECTION
17.1000 | Freq: Once | INTRAVENOUS | Status: AC | PRN
  Administered 2013-03-31: 17.1 via INTRAVENOUS

## 2013-04-02 HISTORY — PX: OTHER SURGICAL HISTORY: SHX169

## 2013-04-03 ENCOUNTER — Other Ambulatory Visit: Payer: Self-pay | Admitting: Urology

## 2013-04-04 ENCOUNTER — Telehealth: Payer: Self-pay | Admitting: Hematology and Oncology

## 2013-04-04 ENCOUNTER — Other Ambulatory Visit: Payer: Self-pay | Admitting: Oncology

## 2013-04-04 NOTE — Telephone Encounter (Signed)
Added f/u appt for 10/6 per 10/3 pof lmonvm for pt and mailed schedule. appt for April w/CP2 cx'd.

## 2013-04-04 NOTE — Telephone Encounter (Signed)
Pt called back and cannot come 10/6. appt r/s to 10/13 which was the other date given per 10/3 pof. Pt given new appt for 10/13 @ 8:30am and asked to disregard schedule that was mailed for 10/6.

## 2013-04-07 ENCOUNTER — Ambulatory Visit: Payer: Medicare Other | Admitting: Hematology and Oncology

## 2013-04-14 ENCOUNTER — Encounter: Payer: Self-pay | Admitting: *Deleted

## 2013-04-14 ENCOUNTER — Encounter: Payer: Self-pay | Admitting: Hematology and Oncology

## 2013-04-14 ENCOUNTER — Ambulatory Visit (HOSPITAL_BASED_OUTPATIENT_CLINIC_OR_DEPARTMENT_OTHER): Payer: Medicare Other | Admitting: Hematology and Oncology

## 2013-04-14 ENCOUNTER — Telehealth: Payer: Self-pay | Admitting: Hematology and Oncology

## 2013-04-14 VITALS — BP 115/79 | HR 88 | Temp 97.6°F | Resp 20 | Ht 69.0 in | Wt 206.8 lb

## 2013-04-14 DIAGNOSIS — M899 Disorder of bone, unspecified: Secondary | ICD-10-CM

## 2013-04-14 DIAGNOSIS — R590 Localized enlarged lymph nodes: Secondary | ICD-10-CM | POA: Insufficient documentation

## 2013-04-14 DIAGNOSIS — D638 Anemia in other chronic diseases classified elsewhere: Secondary | ICD-10-CM

## 2013-04-14 DIAGNOSIS — R599 Enlarged lymph nodes, unspecified: Secondary | ICD-10-CM

## 2013-04-14 DIAGNOSIS — B20 Human immunodeficiency virus [HIV] disease: Secondary | ICD-10-CM

## 2013-04-14 DIAGNOSIS — R310 Gross hematuria: Secondary | ICD-10-CM

## 2013-04-14 NOTE — Telephone Encounter (Signed)
gv and printed appt sched and avs for pt for OCT. °

## 2013-04-14 NOTE — Progress Notes (Signed)
Casey Brennan OFFICE PROGRESS NOTE  Casey Harp, MD DIAGNOSIS:  Abnormal lytic lesion, for further management, and background history of HIV infection  SUMMARY OF HEMATOLOGIC HISTORY: This is a 52 year old gentleman accompanied by his sister, April today. I reviewed his records and collaborated the history with the patient. He had background history of HIV infection and recurrent infection in the past. Recently, he started to have gross hematuria. CT scan of the abdomen and pelvis revealed abnormal lytic lesions leading to multiple workup. Biopsy was negative. INTERVAL HISTORY: Casey Brennan 52 y.o. male returns for further followup related to his complaints above. Since he was seen here, he underwent a PET/CT scan to clarify the abnormal bleeding lesions. He continue to have passage of gross hematuria with large amounts of clots. He denies any other forms of bleeding such as epistaxis or hematochezia. He denies any recent fever, chills, night sweats or abnormal weight loss   I have reviewed the past medical history, past surgical history, social history and family history with the patient and they are unchanged from previous note.  ALLERGIES:  is allergic to penicillins and sulfonamide derivatives.  MEDICATIONS: Current outpatient prescriptions:Cholecalciferol 2000 UNITS TABS, Take 1 tablet by mouth daily., Disp: , Rfl: ;  Darunavir Ethanolate (PREZISTA) 800 MG tablet, Take 800 mg by mouth at bedtime. Take with norvir and truvada, Disp: , Rfl: ;  emtricitabine-tenofovir (TRUVADA) 200-300 MG per tablet, Take 1 tablet by mouth daily. TAKE WITH PREZISTA AND NORVIR., Disp: 30 tablet, Rfl: 6 folic acid (FOLVITE) 1 MG tablet, Take 1 mg by mouth daily., Disp: , Rfl: ;  gabapentin (NEURONTIN) 600 MG tablet, Take 600 mg by mouth 4 (four) times daily as needed. , Disp: , Rfl: ;  ipratropium (ATROVENT) 0.02 % nebulizer solution, Take 2.5 mLs (0.5 mg total) by nebulization 3 (three) times daily.,  Disp: 75 mL, Rfl: 6 levalbuterol (XOPENEX HFA) 45 MCG/ACT inhaler, Inhale 1-2 puffs into the lungs every 4 (four) hours as needed for wheezing., Disp: 1 Inhaler, Rfl: 12;  levalbuterol (XOPENEX) 0.63 MG/3ML nebulizer solution, Take 3 mLs (0.63 mg total) by nebulization 3 (three) times daily., Disp: 3 mL, Rfl: 6;  loratadine (CLARITIN) 10 MG tablet, TAKE 1 TABLET BY MOUTH DAILY for seasonal allergies, Disp: 30 tablet, Rfl: 6 Multiple Vitamin (MULTIVITAMIN WITH MINERALS) TABS, Take 1 tablet by mouth daily., Disp: , Rfl: ;  omeprazole (PRILOSEC) 40 MG capsule, Take 40 mg by mouth daily. , Disp: , Rfl: ;  ondansetron (ZOFRAN) 4 MG tablet, Take 1 tablet (4 mg total) by mouth every 8 (eight) hours as needed for nausea., Disp: 30 tablet, Rfl: 6;  QVAR 80 MCG/ACT inhaler, Inhale 1 puff into the lungs daily as needed (wheezing). , Disp: , Rfl:  raltegravir (ISENTRESS) 400 MG tablet, Take 400 mg by mouth at bedtime., Disp: , Rfl: ;  ritonavir (NORVIR) 100 MG TABS, TAKE 1 TABLET BY MOUTH DAILY WITH PREZISTA AND TRUVADA, Disp: 30 tablet, Rfl: 6;  tizanidine (ZANAFLEX) 6 MG capsule, Take 6 mg by mouth 3 (three) times daily as needed. , Disp: , Rfl: ;  traZODone (DESYREL) 50 MG tablet, Take 1 tablet (50 mg total) by mouth at bedtime., Disp: 30 tablet, Rfl: 3 zolpidem (AMBIEN CR) 12.5 MG CR tablet, Take 1 tablet (12.5 mg total) by mouth at bedtime as needed. For sleep, Disp: 30 tablet, Rfl: 0;  ciprofloxacin (CIPRO) 500 MG tablet, , Disp: , Rfl: ;  [DISCONTINUED] famotidine (PEPCID AC) 10 MG tablet, One po  bid x 3 days and then prn upper abdominal pain, Disp: 20 tablet, Rfl: 0   REVIEW OF SYSTEMS:   Constitutional: Denies fevers, chills or night sweats Eyes: Denies blurriness of vision Ears, nose, mouth, throat, and face: Denies mucositis or sore throat Respiratory: Denies cough, dyspnea or wheezes Cardiovascular: Denies palpitation, chest discomfort or lower extremity swelling Gastrointestinal:  Denies nausea,  heartburn or change in bowel habits Skin: Denies abnormal skin rashes Lymphatics: Denies new lymphadenopathy or easy bruising Neurological:Denies numbness, tingling or new weaknesses Behavioral/Psych: Mood is stable, no new changes  All other systems were reviewed with the patient and are negative.  PHYSICAL EXAMINATION: ECOG PERFORMANCE STATUS: 0 - Asymptomatic  Filed Vitals:   04/14/13 0830  BP: 115/79  Pulse: 88  Temp: 97.6 F (36.4 C)  Resp: 20   Filed Weights   04/14/13 0830  Weight: 206 lb 12.8 oz (93.804 kg)   .weight  GENERAL:alert, no distress and comfortable SKIN: skin color, texture, turgor are normal, no rashes or significant lesions EYES: normal, Conjunctiva are pink and non-injected, sclera clear OROPHARYNX:no exudate, no erythema and lips, buccal mucosa, and tongue normal  NECK: supple, thyroid normal size, non-tender, without nodularity LYMPH:  no palpable lymphadenopathy in the cervical, axillary or inguinal LUNGS: clear to auscultation and percussion with normal breathing effort HEART: regular rate & rhythm and no murmurs and no lower extremity edema ABDOMEN:abdomen soft, non-tender and normal bowel sounds Musculoskeletal:no cyanosis of digits and no clubbing  NEURO: alert & oriented x 3 with fluent speech, no focal motor/sensory deficits  LABORATORY DATA:  RADIOGRAPHIC STUDIES: I have personally reviewed the radiological images as listed and agreed with the findings in the report. I reviewed the PET/CT scan with him and his sister which revealed abnormal lymphadenopathy on the left which is PET active  ASSESSMENT:  Abnormal lytic lesion, new left axillary lymphadenopathy  PLAN:  #1 abnormal lytic lesion The cause is unclear. We will observe for now. Workup for multiple myeloma was negative. #2 left axillary lymphadenopathy He could be infection, given his history of HIV infection. Lymphoma cannot be excluded. I will arrange for him to get a  ultrasound-guided biopsy to rule out malignancy. If it is not conclusive we might have to do an excision biopsy on this. #3 chronic anemia This is anemia chronic disease. He is asymptomatic from the anemia. We will observe for now.  he does not require transfusion now. I felt that recent hematuria is the cause of this #4 chronic hematuria Further workup in progress. I think is prudent for the urologists to do another repeat cystoscopy. I did not see any lesions on the PET/CT scan that would explain this.  All questions were answered. The patient knows to call the clinic with any problems, questions or concerns. We can certainly see the patient m   Casey Brennan, Casey Keefe, MD 04/14/2013 9:02 AM

## 2013-04-15 ENCOUNTER — Other Ambulatory Visit: Payer: Self-pay | Admitting: Radiology

## 2013-04-16 ENCOUNTER — Other Ambulatory Visit: Payer: Self-pay | Admitting: Internal Medicine

## 2013-04-16 ENCOUNTER — Encounter (HOSPITAL_COMMUNITY): Payer: Self-pay | Admitting: Pharmacy Technician

## 2013-04-16 DIAGNOSIS — G4700 Insomnia, unspecified: Secondary | ICD-10-CM

## 2013-04-17 ENCOUNTER — Encounter (HOSPITAL_COMMUNITY): Payer: Self-pay

## 2013-04-17 ENCOUNTER — Ambulatory Visit (HOSPITAL_COMMUNITY)
Admission: RE | Admit: 2013-04-17 | Discharge: 2013-04-17 | Disposition: A | Discharge status: Home / Self Care | Payer: Medicare Other | Source: Ambulatory Visit | Attending: Hematology and Oncology | Admitting: Hematology and Oncology

## 2013-04-17 ENCOUNTER — Other Ambulatory Visit: Payer: Self-pay | Admitting: Hematology and Oncology

## 2013-04-17 DIAGNOSIS — Z79899 Other long term (current) drug therapy: Secondary | ICD-10-CM | POA: Diagnosis not present

## 2013-04-17 DIAGNOSIS — R599 Enlarged lymph nodes, unspecified: Secondary | ICD-10-CM | POA: Insufficient documentation

## 2013-04-17 DIAGNOSIS — B20 Human immunodeficiency virus [HIV] disease: Secondary | ICD-10-CM

## 2013-04-17 DIAGNOSIS — R590 Localized enlarged lymph nodes: Secondary | ICD-10-CM

## 2013-04-17 DIAGNOSIS — Z21 Asymptomatic human immunodeficiency virus [HIV] infection status: Secondary | ICD-10-CM | POA: Insufficient documentation

## 2013-04-17 DIAGNOSIS — J45909 Unspecified asthma, uncomplicated: Secondary | ICD-10-CM | POA: Diagnosis not present

## 2013-04-17 LAB — CBC
HCT: 32.9 % — ABNORMAL LOW (ref 39.0–52.0)
Hemoglobin: 10.4 g/dL — ABNORMAL LOW (ref 13.0–17.0)
MCH: 24.4 pg — ABNORMAL LOW (ref 26.0–34.0)
MCHC: 31.6 g/dL (ref 30.0–36.0)
MCV: 77.2 fL — ABNORMAL LOW (ref 78.0–100.0)
Platelets: 308 10*3/uL (ref 150–400)
RBC: 4.26 MIL/uL (ref 4.22–5.81)
RDW: 19.3 % — ABNORMAL HIGH (ref 11.5–15.5)
WBC: 4.6 10*3/uL (ref 4.0–10.5)

## 2013-04-17 LAB — APTT: aPTT: 31 seconds (ref 24–37)

## 2013-04-17 LAB — PROTIME-INR
INR: 0.92 (ref 0.00–1.49)
Prothrombin Time: 12.2 seconds (ref 11.6–15.2)

## 2013-04-17 MED ORDER — MIDAZOLAM HCL 2 MG/2ML IJ SOLN
INTRAMUSCULAR | Status: AC | PRN
  Administered 2013-04-17 (×2): 1 mg via INTRAVENOUS

## 2013-04-17 MED ORDER — FENTANYL CITRATE 0.05 MG/ML IJ SOLN
INTRAMUSCULAR | Status: AC
  Filled 2013-04-17: qty 6

## 2013-04-17 MED ORDER — MIDAZOLAM HCL 2 MG/2ML IJ SOLN
INTRAMUSCULAR | Status: AC
  Filled 2013-04-17: qty 6

## 2013-04-17 MED ORDER — SODIUM CHLORIDE 0.9 % IV SOLN
INTRAVENOUS | Status: DC
  Administered 2013-04-17: 12:00:00 via INTRAVENOUS

## 2013-04-17 MED ORDER — FENTANYL CITRATE 0.05 MG/ML IJ SOLN
INTRAMUSCULAR | Status: AC | PRN
  Administered 2013-04-17 (×2): 50 ug via INTRAVENOUS

## 2013-04-17 NOTE — Procedures (Signed)
Successful LT AXILLARY ADENOPATHY BX NO COMP STABLE PATH PENDING FULL REPORT IN PACS

## 2013-04-17 NOTE — H&P (Signed)
Casey Brennan is an 52 y.o. male.   Chief Complaint: "I'm having a biopsy " HPI: Patient with history of HIV and prior negative biopsy of left iliac crest lytic lesion 10/2012. He now presents with PET positive left axillary lymph node and is scheduled for US guided biopsy today.  Past Medical History  Diagnosis Date  . HIV (human immunodeficiency virus infection) 1988  . Pneumonia 07/12/10    SEPTIC SHOCK  . Severe frontal headaches 08/16/10  . Chest pain 07/12/10    pt states no hx of chest pain - 12/25/11  . Renal cyst   . Asthma   . Gastric ulcer   . Hematuria     negative w/u with Dr. Jory Sims.  Assumed passed stone.     Past Surgical History  Procedure Laterality Date  . Upper leg soft tissue biopsy      left thigh  . Knee arthroscopy      right  . Acne cyst removal    . Breast surgery      left retroareolar abscess i and d, biopsy  . Colonoscopy  12/26/2011    Procedure: COLONOSCOPY;  Surgeon: Shirley Friar, MD;  Location: [facility];  Service: Endoscopy;  Laterality: N/A;  . Esophagogastroduodenoscopy  12/26/2011    Procedure: ESOPHAGOGASTRODUODENOSCOPY (EGD);  Surgeon: Shirley Friar, MD;  Location: [facility];  Service: Endoscopy;  Laterality: N/A;    Family History  Problem Relation Age of Onset  . Stroke Father   . Diabetes Father   . Cancer Father     brain cancer  . Hypertension Sister   . Cancer Maternal Uncle     prostate cancer   Social History:  reports that he quit smoking about 2 years ago. His smoking use included Cigars. He has never used smokeless tobacco. He reports that he does not drink alcohol or use illicit drugs.  Allergies:  Allergies  Allergen Reactions  . Penicillins Shortness Of Breath and Rash  . Sulfonamide Derivatives Shortness Of Breath and Rash  . Lactose Intolerance (Gi) Nausea And Vomiting    Current outpatient prescriptions:Cholecalciferol (VITAMIN D3) 2000 UNITS TABS, Take 2,000 Units by mouth daily as needed  (For Vitamin D Insufficiency.)., Disp: , Rfl: ;  Darunavir Ethanolate (PREZISTA) 800 MG tablet, Take 800 mg by mouth at bedtime. Take with Norvir and Truvada., Disp: , Rfl: ;  emtricitabine-tenofovir (TRUVADA) 200-300 MG per tablet, Take 1 tablet by mouth at bedtime. Take with Prezista and Norvir., Disp: , Rfl:  folic acid (FOLVITE) 1 MG tablet, Take 1 mg by mouth daily as needed (For Folic Acid Insufficiency.). , Disp: , Rfl: ;  loratadine (CLARITIN) 10 MG tablet, Take 10 mg by mouth at bedtime., Disp: , Rfl: ;  Multiple Vitamin (MULTIVITAMIN WITH MINERALS) TABS, Take 1 tablet by mouth at bedtime. , Disp: , Rfl: ;  omeprazole (PRILOSEC) 40 MG capsule, Take 40 mg by mouth at bedtime. , Disp: , Rfl:  QVAR 80 MCG/ACT inhaler, Inhale 1 puff into the lungs daily as needed (wheezing). , Disp: , Rfl: ;  raltegravir (ISENTRESS) 400 MG tablet, Take 800 mg by mouth at bedtime. , Disp: , Rfl: ;  ritonavir (NORVIR) 100 MG capsule, Take 100 mg by mouth at bedtime. Take with Prezista and Truvada., Disp: , Rfl: ;  tizanidine (ZANAFLEX) 6 MG capsule, Take 6 mg by mouth 3 (three) times daily as needed for muscle spasms. , Disp: , Rfl:  ciprofloxacin (CIPRO) 500 MG tablet, Take 500 mg by  mouth 2 (two) times daily. He is to take for 6 days starting three days before his biopsy on 04/20/13., Disp: , Rfl: ;  gabapentin (NEURONTIN) 600 MG tablet, Take 600 mg by mouth 4 (four) times daily as needed (For pain.). , Disp: , Rfl: ;  ipratropium (ATROVENT) 0.02 % nebulizer solution, Take 500 mcg by nebulization 3 (three) times daily as needed for wheezing., Disp: , Rfl:  levalbuterol (XOPENEX HFA) 45 MCG/ACT inhaler, Inhale 1-2 puffs into the lungs every 4 (four) hours as needed for wheezing., Disp: 1 Inhaler, Rfl: 12;  levalbuterol (XOPENEX) 0.63 MG/3ML nebulizer solution, Take 1 ampule by nebulization 3 (three) times daily as needed for wheezing., Disp: , Rfl: ;  ondansetron (ZOFRAN) 4 MG tablet, Take 1 tablet (4 mg total) by mouth  every 8 (eight) hours as needed for nausea., Disp: 30 tablet, Rfl: 6 traZODone (DESYREL) 50 MG tablet, TAKE 1 TABLET BY MOUTH EVERY NIGHT AT BEDTIME, Disp: 30 tablet, Rfl: 3;  traZODone (DESYREL) 50 MG tablet, Take 50 mg by mouth at bedtime as needed for sleep., Disp: , Rfl: ;  zolpidem (AMBIEN CR) 12.5 MG CR tablet, Take 1 tablet (12.5 mg total) by mouth at bedtime as needed. For sleep, Disp: 30 tablet, Rfl: 0 [DISCONTINUED] famotidine (PEPCID AC) 10 MG tablet, One po bid x 3 days and then prn upper abdominal pain, Disp: 20 tablet, Rfl: 0 Current facility-administered medications:0.9 %  sodium chloride infusion, , Intravenous, Continuous, Brayton El, PA-C, Last Rate: 20 mL/hr at 04/17/13 1147   Results for orders placed during the hospital encounter of 04/17/13 (from the past 48 hour(s))  APTT     Status: None   Collection Time    04/17/13 11:45 AM      Result Value Range   aPTT 31  24 - 37 seconds  CBC     Status: Abnormal   Collection Time    04/17/13 11:45 AM      Result Value Range   WBC 4.6  4.0 - 10.5 K/uL   RBC 4.26  4.22 - 5.81 MIL/uL   Hemoglobin 10.4 (*) 13.0 - 17.0 g/dL   HCT 40.9 (*) 81.1 - 91.4 %   MCV 77.2 (*) 78.0 - 100.0 fL   MCH 24.4 (*) 26.0 - 34.0 pg   MCHC 31.6  30.0 - 36.0 g/dL   RDW 78.2 (*) 95.6 - 21.3 %   Platelets 308  150 - 400 K/uL  PROTIME-INR     Status: None   Collection Time    04/17/13 11:45 AM      Result Value Range   Prothrombin Time 12.2  11.6 - 15.2 seconds   INR 0.92  0.00 - 1.49   No results found.  Review of Systems  Constitutional: Negative for fever and chills.  Respiratory: Negative for cough and shortness of breath.   Cardiovascular: Negative for chest pain.  Gastrointestinal: Negative for nausea, vomiting and abdominal pain.  Genitourinary:       Prior hx of hematuria  Musculoskeletal: Negative for back pain.  Neurological: Negative for headaches.    Temperature 97.6 F (36.4 C), temperature source Oral. Physical Exam   Constitutional: He is oriented to person, place, and time. He appears well-developed and well-nourished.  Cardiovascular: Normal rate and regular rhythm.   Respiratory: Effort normal and breath sounds normal.  GI: Soft. Bowel sounds are normal. There is no tenderness.  Musculoskeletal: Normal range of motion. He exhibits no edema.  Neurological: He is alert and  oriented to person, place, and time.     Assessment/Plan Pt with hx of HIV ; now with PET positive left axillary lymph node. Plan is for US guided left axillary lymph node biopsy today. Details/risks of procedure d/w pt/family with their understanding and consent.  Mariadel Mruk,D KEVIN 04/17/2013, 12:35 PM

## 2013-04-18 ENCOUNTER — Encounter (HOSPITAL_BASED_OUTPATIENT_CLINIC_OR_DEPARTMENT_OTHER): Payer: Self-pay | Admitting: *Deleted

## 2013-04-21 ENCOUNTER — Encounter (HOSPITAL_BASED_OUTPATIENT_CLINIC_OR_DEPARTMENT_OTHER): Payer: Self-pay | Admitting: *Deleted

## 2013-04-21 LAB — CULTURE, ROUTINE-ABSCESS
Culture: NO GROWTH
Gram Stain: NONE SEEN

## 2013-04-21 NOTE — Progress Notes (Signed)
04/21/13 1514  OBSTRUCTIVE SLEEP APNEA  Have you ever been diagnosed with sleep apnea through a sleep study? No  Do you snore loudly (loud enough to be heard through closed doors)?  0  Do you often feel tired, fatigued, or sleepy during the daytime? 0  Has anyone observed you stop breathing during your sleep? 0  Do you have, or are you being treated for high blood pressure? 0  BMI more than 35 kg/m2? 0  Age over 50 years old? 0  Gender: 1  Obstructive Sleep Apnea Score 1

## 2013-04-21 NOTE — Progress Notes (Signed)
NPO AFTER MN. ARRIVE 0930. NEEDS ISTAT 8. CURRENT EKG IN EPIC AND CHART. WILL DO FLEET ENEMA AM DOS.  PT STATES GIVEN RX FOR CIPRO, BUT NOTED PT ALLERGY TO SULFA, ADVISED PT TO CALL HIS  PHARMACY .  PT WILL BRING RESCUE INHALER.

## 2013-04-21 NOTE — Progress Notes (Signed)
NPO AFTER MN. ARRIVE 0930. NEEDS ISTAT 8.  CURRENT EKG IN EPIC AND CHART. WILL DO FLEET ENEMA AM DOS.

## 2013-04-25 ENCOUNTER — Ambulatory Visit (HOSPITAL_BASED_OUTPATIENT_CLINIC_OR_DEPARTMENT_OTHER)
Admission: RE | Admit: 2013-04-25 | Discharge: 2013-04-25 | Disposition: A | Discharge status: Home / Self Care | Payer: Medicare Other | Source: Ambulatory Visit | Attending: Urology | Admitting: Urology

## 2013-04-25 ENCOUNTER — Encounter (HOSPITAL_BASED_OUTPATIENT_CLINIC_OR_DEPARTMENT_OTHER)
Admission: RE | Disposition: A | Discharge status: Home / Self Care | Payer: Self-pay | Source: Ambulatory Visit | Attending: Urology

## 2013-04-25 ENCOUNTER — Encounter (HOSPITAL_BASED_OUTPATIENT_CLINIC_OR_DEPARTMENT_OTHER): Payer: Medicare Other | Admitting: Anesthesiology

## 2013-04-25 ENCOUNTER — Ambulatory Visit (HOSPITAL_BASED_OUTPATIENT_CLINIC_OR_DEPARTMENT_OTHER): Payer: Medicare Other | Admitting: Anesthesiology

## 2013-04-25 ENCOUNTER — Ambulatory Visit (HOSPITAL_COMMUNITY): Payer: Medicare Other

## 2013-04-25 ENCOUNTER — Encounter (HOSPITAL_BASED_OUTPATIENT_CLINIC_OR_DEPARTMENT_OTHER): Payer: Self-pay | Admitting: *Deleted

## 2013-04-25 DIAGNOSIS — K219 Gastro-esophageal reflux disease without esophagitis: Secondary | ICD-10-CM | POA: Diagnosis not present

## 2013-04-25 DIAGNOSIS — N281 Cyst of kidney, acquired: Secondary | ICD-10-CM | POA: Insufficient documentation

## 2013-04-25 DIAGNOSIS — R972 Elevated prostate specific antigen [PSA]: Secondary | ICD-10-CM | POA: Diagnosis not present

## 2013-04-25 DIAGNOSIS — N182 Chronic kidney disease, stage 2 (mild): Secondary | ICD-10-CM | POA: Diagnosis not present

## 2013-04-25 DIAGNOSIS — Z21 Asymptomatic human immunodeficiency virus [HIV] infection status: Secondary | ICD-10-CM | POA: Diagnosis not present

## 2013-04-25 DIAGNOSIS — R310 Gross hematuria: Secondary | ICD-10-CM | POA: Diagnosis not present

## 2013-04-25 DIAGNOSIS — I129 Hypertensive chronic kidney disease with stage 1 through stage 4 chronic kidney disease, or unspecified chronic kidney disease: Secondary | ICD-10-CM | POA: Diagnosis not present

## 2013-04-25 DIAGNOSIS — C61 Malignant neoplasm of prostate: Secondary | ICD-10-CM

## 2013-04-25 DIAGNOSIS — Z87891 Personal history of nicotine dependence: Secondary | ICD-10-CM | POA: Insufficient documentation

## 2013-04-25 DIAGNOSIS — R319 Hematuria, unspecified: Secondary | ICD-10-CM | POA: Diagnosis not present

## 2013-04-25 HISTORY — DX: Anemia, unspecified: D64.9

## 2013-04-25 HISTORY — DX: Gastro-esophageal reflux disease without esophagitis: K21.9

## 2013-04-25 HISTORY — DX: Personal history of other diseases of the circulatory system: Z86.79

## 2013-04-25 HISTORY — PX: CYSTOSCOPY/RETROGRADE/URETEROSCOPY: SHX5316

## 2013-04-25 HISTORY — DX: Follicular disorder, unspecified: L73.9

## 2013-04-25 HISTORY — DX: Malignant neoplasm of prostate: C61

## 2013-04-25 HISTORY — DX: Personal history of other diseases of the digestive system: Z87.19

## 2013-04-25 HISTORY — DX: Elevated prostate specific antigen (PSA): R97.20

## 2013-04-25 HISTORY — DX: Headache: R51

## 2013-04-25 HISTORY — PX: PROSTATE BIOPSY: SHX241

## 2013-04-25 HISTORY — DX: Presence of spectacles and contact lenses: Z97.3

## 2013-04-25 HISTORY — DX: Localized enlarged lymph nodes: R59.0

## 2013-04-25 HISTORY — DX: Personal history of traumatic brain injury: Z87.820

## 2013-04-25 HISTORY — DX: Other specified disorders of bone, thigh: M89.8X5

## 2013-04-25 HISTORY — DX: Personal history of peptic ulcer disease: Z87.11

## 2013-04-25 HISTORY — DX: Personal history of Methicillin resistant Staphylococcus aureus infection: Z86.14

## 2013-04-25 HISTORY — DX: Gross hematuria: R31.0

## 2013-04-25 LAB — POCT I-STAT, CHEM 8
BUN: 8 mg/dL (ref 6–23)
Calcium, Ion: 1.22 mmol/L (ref 1.12–1.23)
Chloride: 104 mEq/L (ref 96–112)
Creatinine, Ser: 1.4 mg/dL — ABNORMAL HIGH (ref 0.50–1.35)
Glucose, Bld: 101 mg/dL — ABNORMAL HIGH (ref 70–99)
HCT: 33 % — ABNORMAL LOW (ref 39.0–52.0)
Hemoglobin: 11.2 g/dL — ABNORMAL LOW (ref 13.0–17.0)
Potassium: 3.7 mEq/L (ref 3.5–5.1)
Sodium: 142 mEq/L (ref 135–145)
TCO2: 25 mmol/L (ref 0–100)

## 2013-04-25 SURGERY — CYSTOSCOPY/RETROGRADE/URETEROSCOPY
Anesthesia: General | Site: Ureter | Wound class: Clean Contaminated

## 2013-04-25 MED ORDER — OXYCODONE-ACETAMINOPHEN 5-325 MG PO TABS: 1.0000 | ORAL_TABLET | ORAL | Status: DC | PRN

## 2013-04-25 MED ORDER — KETOROLAC TROMETHAMINE 30 MG/ML IJ SOLN
INTRAMUSCULAR | Status: DC | PRN
  Administered 2013-04-25: 30 mg via INTRAVENOUS

## 2013-04-25 MED ORDER — STERILE WATER FOR IRRIGATION IR SOLN
Status: DC | PRN
  Administered 2013-04-25: 3000 mL via INTRAVESICAL

## 2013-04-25 MED ORDER — GENTAMICIN SULFATE 40 MG/ML IJ SOLN
400.0000 mg | INTRAVENOUS | Status: DC
  Filled 2013-04-25: qty 10

## 2013-04-25 MED ORDER — LACTATED RINGERS IV SOLN
INTRAVENOUS | Status: DC
  Administered 2013-04-25: 14:00:00 via INTRAVENOUS
  Filled 2013-04-25: qty 1000

## 2013-04-25 MED ORDER — PROPOFOL 10 MG/ML IV BOLUS
INTRAVENOUS | Status: DC | PRN
  Administered 2013-04-25: 150 mg via INTRAVENOUS

## 2013-04-25 MED ORDER — DEXAMETHASONE SODIUM PHOSPHATE 4 MG/ML IJ SOLN
INTRAMUSCULAR | Status: DC | PRN
  Administered 2013-04-25: 8 mg via INTRAVENOUS

## 2013-04-25 MED ORDER — IOHEXOL 300 MG/ML  SOLN
INTRAMUSCULAR | Status: DC | PRN
  Administered 2013-04-25: 19 mL

## 2013-04-25 MED ORDER — SENNOSIDES-DOCUSATE SODIUM 8.6-50 MG PO TABS
1.0000 | ORAL_TABLET | Freq: Two times a day (BID) | ORAL | Status: DC
Start: 2013-04-25 — End: 2013-05-20

## 2013-04-25 MED ORDER — GENTAMICIN IN SALINE 1.6-0.9 MG/ML-% IV SOLN
80.0000 mg | INTRAVENOUS | Status: DC
  Filled 2013-04-25: qty 50

## 2013-04-25 MED ORDER — LACTATED RINGERS IV SOLN
INTRAVENOUS | Status: DC | PRN
  Administered 2013-04-25: 10:00:00 via INTRAVENOUS

## 2013-04-25 MED ORDER — HYDROMORPHONE HCL PF 1 MG/ML IJ SOLN
0.2500 mg | INTRAMUSCULAR | Status: DC | PRN
  Filled 2013-04-25: qty 1

## 2013-04-25 MED ORDER — MIDAZOLAM HCL 5 MG/5ML IJ SOLN
INTRAMUSCULAR | Status: DC | PRN
  Administered 2013-04-25 (×2): 1 mg via INTRAVENOUS

## 2013-04-25 MED ORDER — SODIUM CHLORIDE 0.9 % IV SOLN
INTRAVENOUS | Status: DC
  Administered 2013-04-25: 10:00:00 via INTRAVENOUS
  Filled 2013-04-25: qty 1000

## 2013-04-25 MED ORDER — PROMETHAZINE HCL 25 MG/ML IJ SOLN
6.2500 mg | INTRAMUSCULAR | Status: DC | PRN
  Filled 2013-04-25: qty 1

## 2013-04-25 MED ORDER — SODIUM CHLORIDE 0.9 % IR SOLN
Status: DC | PRN
  Administered 2013-04-25: 6000 mL via INTRAVESICAL

## 2013-04-25 MED ORDER — ONDANSETRON HCL 4 MG/2ML IJ SOLN
INTRAMUSCULAR | Status: DC | PRN
  Administered 2013-04-25: 4 mg via INTRAVENOUS

## 2013-04-25 MED ORDER — GENTAMICIN SULFATE 40 MG/ML IJ SOLN
140.1000 mg | INTRAVENOUS | Status: DC | PRN
  Administered 2013-04-25: 400 mg via INTRAVENOUS

## 2013-04-25 MED ORDER — LIDOCAINE HCL (CARDIAC) 20 MG/ML IV SOLN
INTRAVENOUS | Status: DC | PRN
  Administered 2013-04-25: 80 mg via INTRAVENOUS

## 2013-04-25 MED ORDER — FENTANYL CITRATE 0.05 MG/ML IJ SOLN
INTRAMUSCULAR | Status: DC | PRN
  Administered 2013-04-25: 50 ug via INTRAVENOUS
  Administered 2013-04-25 (×6): 25 ug via INTRAVENOUS

## 2013-04-25 SURGICAL SUPPLY — 28 items
BAG DRN ANRFLXCHMBR STRAP LEK (BAG) ×2
BAG URINE LEG 19OZ MD ST LTX (BAG) ×1 IMPLANT
BAG URO CATCHER STRL LF (DRAPE) ×3 IMPLANT
BASKET LASER NITINOL 1.9FR (BASKET) ×2 IMPLANT
BASKET ZERO TIP NITINOL 2.4FR (BASKET) IMPLANT
BSKT STON RTRVL 120 1.9FR (BASKET)
BSKT STON RTRVL ZERO TP 2.4FR (BASKET)
CATH COUDE FOLEY 2W 5CC 20FR (CATHETERS) ×1 IMPLANT
CATH INTERMIT  6FR 70CM (CATHETERS) IMPLANT
CLOTH BEACON ORANGE TIMEOUT ST (SAFETY) ×3 IMPLANT
DRAPE CAMERA CLOSED 9X96 (DRAPES) ×3 IMPLANT
GAUZE SPONGE 4X4 12PLY STRL LF (GAUZE/BANDAGES/DRESSINGS) ×1 IMPLANT
GLOVE BIO SURGEON STRL SZ7.5 (GLOVE) ×4 IMPLANT
GOWN PREVENTION PLUS XLARGE (GOWN DISPOSABLE) ×3 IMPLANT
GOWN STRL NON-REIN LRG LVL3 (GOWN DISPOSABLE) ×6 IMPLANT
GUIDEWIRE ANG ZIPWIRE 038X150 (WIRE) ×3 IMPLANT
GUIDEWIRE STR DUAL SENSOR (WIRE) ×3 IMPLANT
IV NS IRRIG 3000ML ARTHROMATIC (IV SOLUTION) ×4 IMPLANT
NDL SAFETY ECLIPSE 18X1.5 (NEEDLE) IMPLANT
NEEDLE HYPO 18GX1.5 SHARP (NEEDLE)
NEEDLE HYPO 22GX1.5 SAFETY (NEEDLE) IMPLANT
PACK CYSTOSCOPY (CUSTOM PROCEDURE TRAY) ×3 IMPLANT
SYR CONTROL 10ML LL (SYRINGE) IMPLANT
SYRINGE 10CC LL (SYRINGE) ×4 IMPLANT
TUBE FEEDING 8FR 16IN STR KANG (MISCELLANEOUS) ×3 IMPLANT
UNDERPAD 30X30 INCONTINENT (UNDERPADS AND DIAPERS) ×3 IMPLANT
WATER STERILE IRR 3000ML UROMA (IV SOLUTION) ×1 IMPLANT
WATER STERILE IRR 500ML POUR (IV SOLUTION) ×1 IMPLANT

## 2013-04-25 NOTE — Brief Op Note (Signed)
04/25/2013  12:38 PM  PATIENT:  Casey Brennan  52 y.o. male  PRE-OPERATIVE DIAGNOSIS:  RECURRENT HEMATURIA, ELEVATED PSA  POST-OPERATIVE DIAGNOSIS:  * No post-op diagnosis entered *  PROCEDURE:  Procedure(s): CYSTOSCOPY/ BILATERAL RETROGRADES; BLADDER BIOPSIES (Bilateral) BIOPSY TRANSRECTAL ULTRASONIC PROSTATE (TUBP) (N/A)  SURGEON:  Surgeon(s) and Role:    * Sebastian Ache, MD - Primary  PHYSICIAN ASSISTANT:   ASSISTANTS: none   ANESTHESIA:   general  EBL:  Total I/O In: 200 [I.V.:200] Out: -   BLOOD ADMINISTERED:none  DRAINS: Foley to straight drain   LOCAL MEDICATIONS USED:  NONE  SPECIMEN:  Source of Specimen:  1 - Bladder Biopsy (erythema), 2 - Prostatic Urethra Biopsy (shaggy mucosa), 3- 3 12 core prostate biopsy as per template  DISPOSITION OF SPECIMEN:  PATHOLOGY  COUNTS:  YES  TOURNIQUET:  * No tourniquets in log *  DICTATION: .Other Dictation: Dictation Number [id number]  PLAN OF CARE: Discharge to home after PACU  PATIENT DISPOSITION:  PACU - hemodynamically stable.   Delay start of Pharmacological VTE agent (>24hrs) due to surgical blood loss or risk of bleeding: yes

## 2013-04-25 NOTE — Progress Notes (Signed)
Dr. Rica Mast spoke with Casey Brennan about removing dentures.  The Casey Brennan decided he did not want to remove dentures.  Dr.Fortune informed the Casey Brennan of the risk of leaving dentures in during surgery.  Casey Brennan decided to take the risk. When his family came to preop he was upset about the dentures.  Dr.Fortune spoke with Casey Brennan and his family again to explain the he was not trying to upset him just wanted him to understand the risk so he could make a decision that would work for him

## 2013-04-25 NOTE — Op Note (Signed)
NAME:  Casey Brennan, Casey Brennan               ACCOUNT NO.:  [id number]  MEDICAL RECORD NO.:  [id number]  LOCATION:                                 FACILITY:  PHYSICIAN:  Bernardina Cacho, MD     DATE OF BIRTH:  05/26/1961  DATE OF PROCEDURE: 04/25/2013 DATE OF DISCHARGE:  04/25/2013                              OPERATIVE REPORT   DIAGNOSES: 1. Gross hematuria. 2. Elevated PSA. 3. History of urethral sounding.  PROCEDURE: 1. Cystoscopy with bilateral retrograde pyelograms and interpretation. 2. Bladder biopsy with fulguration. 3. Prostate biopsy.  COMPLICATIONS:  None.  SPECIMENS: 1. Bladder erythema. 2. Prostatic urethra mucosal abnormality. 3. A 12 core prostate biopsy, all for permanent pathology.  FINDINGS: 1. Several tiny erythematous patches in the urinary bladder.  These     were biopsied appeared to be most consistent with irritation from     foreign body. 2. Unremarkable bilateral retrograde pyelograms. 3. Shaggy mucosal abnormality in the prostatic urethra, likely due to     irritation from foreign body.  These were biopsied. 4. Prostate volume 45.81 mL.  No median lobe.  The prostate dimensions     length 5.8, height 3.2, width 4.7.  INDICATION:  Casey Brennan is a pleasant 51-year-old gentleman with a history of well controlled HIV as well as gross hematuria.  He was found on workup of this not to have any significant abnormalities, however, he does admit to practice of urethral sounding, passed a large metallic objects per urethra.  He also has a significant elevated PSA.  Options were discussed including, prostate biopsy and operative endoscopy to further rule out radiolucent stones given his history of Protease inhibitor use for atypical causes such as hemorrhagic cystitis given his HIV.  He wished to proceed.  Informed consent was obtained and placed in the medical record.  PROCEDURE IN DETAIL:  The patient being Casey Brennan, procedure being cystoscopy, bilateral  retrograde pyelogram, possible bladder biopsy and prostate biopsy was confirmed.  Procedure carried out.  Time-out was aforementioned.  Intravenous antibiotics administered.  General LMA anesthesia was introduced.  The patient was placed into a low lithotomy position.  Sterile field was created by prepping and draping the patient's penis, perineum, proximal thighs using iodine x3.  Next, cystourethroscopy performed using a 22-French rigid scope with 12-degree offset lens.  Inspection of the anterior and posterior urethra only revealed some shaggy mucosal abnormality to the prostatic urethra.  This did not difficulty in appearance of transition cell carcinoma.  It did appear to be somewhat consistent with possible irritation from foreign body.  Inspection of urinary bladder revealed no diverticula, calcifications, or papular lesions.  There were several small erythematous patches mostly on the posterior wall.  These also appear to be consistent with possible foreign body irritation, however, carcinoma in situ cannot be ruled out.  Attention was then directed to bilateral retrograde pyelogram, 1st on the right side.  The right ureter was cannulated with a 6-French end-hole catheter and right retrograde was obtained.  Right retrograde pyelogram demonstrated a single right ureter, single system, right kidney.  No filling defects or narrowing noted.  There is several bubbles noted that were seen to move   in the perfectly round and not consistent filling defects.  Similarly, left retrograde pyelogram was obtained.  Left retrograde pyelogram demonstrates single left ureter, single system left kidney.  No filling defects, narrowing, or hydronephrosis noted. There are also some bubbles on this side that perfectly round and mobile.  No filling defects.  Attention was then directed to biopsy of the erythematous bladder lesions.  Cold cup biopsy forceps were used to biopsy to the small lesions at  the level of the mucosa lesion set aside labeled bladder biopsy erythema at the base of these were fulgurated with coagulation current using sterile water and no evidence of perforation was noted.  Similarly, a small biopsy was taken of the prostatic urethra abnormality, this set aside for permanent pathology in the base of this was similarly coagulated.  Hemostasis appeared excellent.  The cystoscope was exchanged for a 20-French Foley catheter with 10 mL sterile water in the balloon next to straight drain. Attention was then directed to the prostate biopsy.  Using transverse ultrasound guidance, the prostate was 1st measured as per above and 12 core template biopsy was performed including specimens from the right lateral base, right lateral mid, right lateral apex, right mid base, right mid medial, right mid apex, left lateral base, left lateral mid, left lateral apex, left mid base, left mid, mid, left mid apex.  All the specimens were sent separately for permanent pathology, following the maneuvers.  The patient also had excellent hemostasis per rectum.  Foley catheters next to straight drain.  Procedure was then terminated.  The patient tolerated the procedure well.  There were no immediate periprocedural complications.  The patient was taken to the postanesthesia care unit in stable condition.          ______________________________ Corean Yoshimura, MD     TM/MEDQ  D:  04/25/2013  T:  04/25/2013  Job:  [id number] 

## 2013-04-25 NOTE — H&P (Signed)
Casey Brennan is an 52 y.o. male.    Chief Complaint:Pre-Op Operative Cysto, Retrogrades, Ureteroscopy, Prostate Biopsy  HPI:   1 - Gross Hematuria, h/o Genital Piercing and "Sounding"- Pt with several episdoes gross hematuria with clots on/ off since 07/2012. Has had extensive eval wtih CT, cysto which revealed small bilateral renal cysts only. He has HIV as well as h/o penile piercing (glans through pendolous urethra) and practices occasional "sounding" for sexual arousal wich infolves placement of long rods into penis. Had small incidetnal lucent lesion inpelvic bone by CT htat was biopsy negative for malignancy per report.   2 - Bilateral Renal Cysts - Pt with bilteral minimally complex renal cysts by CT 2014, largest <3cm. A right sided mid pole cyst apppeared protinaceous / hemorragic by prior imaging.  3 - Prostate Screening - Several uncles with prosate cancer 03/2013 - DRE 30gm smooth / PSA 4.82 (18% free) 08/2012 - PSA 3.44  PMH sig for HIV (since 1980's under excellent control), C-spine FX/trauma. No CV disease.   Today Casey Brennan is seen to   Past Medical History  Diagnosis Date  . HIV (human immunodeficiency virus infection) 1988  . Asthma   . Chronic anemia     normocytic  . Chronic folliculitis   . Gross hematuria   . Elevated PSA   . H/O pericarditis     2010--  myopercarditis--  resolved  . History of concussion     2012  &  2013  RESIDUAL HA'S --  RESOLVED  . Axillary lymphadenopathy   . History of MRSA infection     infected boil  . History of gastric ulcer   . CKD (chronic kidney disease), stage II     nephrologist-  dr patel (secondary to hiv vs htn)  . Bilateral renal cysts   . Lytic bone lesion of hip     WORK-UP DONE BY ONCOLOGIST DR HA --  NOT MALIGNANT  . Wears glasses   . GERD (gastroesophageal reflux disease)   . Headache(784.0)     HX SEVERE FRONTAL HA'S    Past Surgical History  Procedure Laterality Date  . Upper leg soft tissue biopsy       left thigh  . Knee arthroscopy Right 1985  . Colonoscopy  12/26/2011    Procedure: COLONOSCOPY;  Surgeon: Shirley Friar, MD;  Location: [facility];  Service: Endoscopy;  Laterality: N/A;  . Esophagogastroduodenoscopy  12/26/2011    Procedure: ESOPHAGOGASTRODUODENOSCOPY (EGD);  Surgeon: Shirley Friar, MD;  Location: [facility];  Service: Endoscopy;  Laterality: N/A;  . Cardiac catheterization  03-23-2009  DR COOPER    NORMAL CORONARY ARTERIES  . Excisional bx left breast mass/  i  &  d left breast abscess  03-24-2009  . Excision chronic left breast abscess  09-21-2010    Family History  Problem Relation Age of Onset  . Stroke Father   . Diabetes Father   . Cancer Father     brain cancer  . Hypertension Sister   . Cancer Maternal Uncle     prostate cancer   Social History:  reports that he quit smoking about 2 years ago. His smoking use included Cigars. He has never used smokeless tobacco. He reports that he does not drink alcohol or use illicit drugs.  Allergies:  Allergies  Allergen Reactions  . Penicillins Shortness Of Breath and Rash  . Sulfonamide Derivatives Shortness Of Breath and Rash  . Lactose Intolerance (Gi) Nausea And Vomiting  No prescriptions prior to admission    No results found for this or any previous visit (from the past 48 hour(s)). No results found.  Review of Systems  Constitutional: Negative.  Negative for fever and chills.  HENT: Negative.   Eyes: Negative.   Respiratory: Negative.   Cardiovascular: Negative.   Gastrointestinal: Negative.   Genitourinary: Positive for hematuria. Negative for flank pain.  Musculoskeletal: Negative.   Skin: Negative.   Neurological: Negative.   Endo/Heme/Allergies: Negative.   Psychiatric/Behavioral: Negative.     Height 5\' 9"  (1.753 m), weight 93.441 kg (206 lb). Physical Exam  Constitutional: He is oriented to person, place, and time. He appears well-developed and well-nourished.  HENT:   Head: Normocephalic and atraumatic.  Eyes: EOM are normal. Pupils are equal, round, and reactive to light.  Neck: Normal range of motion. Neck supple.  Cardiovascular: Normal rate.   Respiratory: Effort normal.  GI: Soft. Bowel sounds are normal.  Genitourinary: Penis normal.  Musculoskeletal: Normal range of motion.  Neurological: He is alert and oriented to person, place, and time.  Skin: Skin is warm and dry.  Psychiatric: He has a normal mood and affect. His behavior is normal. Judgment and thought content normal.     Assessment/Plan  1 - Gross Hematuria, h/o Genital Piercing, and "sounding" - Unusual case. In addition to more common etiologies such as urothelial malignancy and stones DDX here included HIV nephropathy / cystopathy (up to 25% HIV patients), bleeding from prior genital piercing site or sounding trauma, drug stones (older protease inhibitor, can be "invisible" by some imaging modalities, and were in high useage late 1980s).    We then rediscussed the risks (pain, invasive procedure, bleeding, false positive/negative, serious / life-threatening infection) and benefits (reassurance, better confirmation of diagnosis) as well as the fact that additional biopsies or imaging may be recommended at a later time if future PSA values are worrisome. We stressed the importance of adherence to the enemas and antibiotics as prescribed as well as the possibility of day-of-procedure antibiotic shot. We also discussed unique risks of cysto, retrogrades, ureteroscoyp in cludign non-didagnosis, injury  to kidney / bladder / urethra, need for peri-operative stents as well as rare risks such as DVT,PT,CVA,Mortality. Pt voiced understanding and wishes to proceed.   2 - Bilateral Renal Cysts - relatively non-complex and felt to be unlikely source of gross hematuria. he has very good understanding of these.    3 - Prostate Screening - Prostate biopsy today as well as per above.    Casey Brennan,  Casey Brennan 04/25/2013, 6:07 AM

## 2013-04-25 NOTE — Anesthesia Postprocedure Evaluation (Signed)
Anesthesia Post Note  Patient: Casey Brennan  Procedure(s) Performed: Procedure(s) (LRB): CYSTOSCOPY/ BILATERAL RETROGRADES; BLADDER BIOPSIES (Bilateral) BIOPSY TRANSRECTAL ULTRASONIC PROSTATE (TUBP) (N/A)  Anesthesia type: General  Patient location: PACU  Post pain: Pain level controlled  Post assessment: Post-op Vital signs reviewed  Last Vitals:  Filed Vitals:   04/25/13 1345  BP: 119/71  Pulse: 66  Temp:   Resp: 9    Post vital signs: Reviewed  Level of consciousness: sedated  Complications: No apparent anesthesia complications

## 2013-04-25 NOTE — Anesthesia Preprocedure Evaluation (Addendum)
Anesthesia Evaluation  Patient identified by MRN, date of birth, ID band Patient awake    Reviewed: Allergy & Precautions, H&P , NPO status , Patient's Chart, lab work & pertinent test results  Airway Mallampati: III TM Distance: >3 FB Neck ROM: Full    Dental  (+) Edentulous Upper, Edentulous Lower, Lower Dentures and Upper Dentures   Pulmonary asthma , pneumonia -, former smoker,  breath sounds clear to auscultation  Pulmonary exam normal       Cardiovascular hypertension, Rhythm:Regular Rate:Normal  Hx of pericarditis 2010   Neuro/Psych  Headaches, PSYCHIATRIC DISORDERS negative psych ROS   GI/Hepatic negative GI ROS, Neg liver ROS, PUD, GERD-  ,  Endo/Other  Morbid obesity  Renal/GU Renal InsufficiencyRenal disease  negative genitourinary   Musculoskeletal negative musculoskeletal ROS (+)   Abdominal   Peds negative pediatric ROS (+)  Hematology  (+) Blood dyscrasia, anemia , HIV,   Anesthesia Other Findings Patient has indicated his refusal to remove dentures during course of anesthetic and surgical procedure. Risk and concerns relating to injury to the prosthetic as well as possible anesthetic complications relating to denture were explained and all questions answered. Patient understands and elects to proceed.   Reproductive/Obstetrics negative OB ROS                          Anesthesia Physical Anesthesia Plan  ASA: III  Anesthesia Plan: General   Post-op Pain Management:    Induction: Intravenous  Airway Management Planned: LMA  Additional Equipment:   Intra-op Plan:   Post-operative Plan: Extubation in OR  Informed Consent: I have reviewed the patients History and Physical, chart, labs and discussed the procedure including the risks, benefits and alternatives for the proposed anesthesia with the patient or authorized representative who has indicated his/her understanding and  acceptance.   Dental advisory given  Plan Discussed with: CRNA  Anesthesia Plan Comments:         Anesthesia Quick Evaluation

## 2013-04-25 NOTE — Transfer of Care (Signed)
Immediate Anesthesia Transfer of Care Note  Patient: Casey Brennan  Procedure(s) Performed: Procedure(s) (LRB): CYSTOSCOPY/ BILATERAL RETROGRADES; BLADDER BIOPSIES (Bilateral) BIOPSY TRANSRECTAL ULTRASONIC PROSTATE (TUBP) (N/A)  Patient Location: PACU  Anesthesia Type: General  Level of Consciousness: awake, sedated, patient cooperative and responds to stimulation  Airway & Oxygen Therapy: Patient Spontanous Breathing and Patient connected to face mask oxygen  Post-op Assessment: Report given to PACU RN, Post -op Vital signs reviewed and stable and Patient moving all extremities  Post vital signs: Reviewed and stable  Complications: No apparent anesthesia complications

## 2013-04-25 NOTE — Anesthesia Procedure Notes (Signed)
Procedure Name: LMA Insertion Date/Time: 04/25/2013 11:47 AM Performed by: Jessica Priest Pre-anesthesia Checklist: Patient identified, Emergency Drugs available, Suction available and Patient being monitored Patient Re-evaluated:Patient Re-evaluated prior to inductionOxygen Delivery Method: Circle System Utilized Preoxygenation: Pre-oxygenation with 100% oxygen Intubation Type: IV induction Ventilation: Mask ventilation without difficulty LMA: LMA with gastric port inserted LMA Size: 4.0 Number of attempts: 1 Placement Confirmation: positive ETCO2 Tube secured with: Tape Dental Injury: Teeth and Oropharynx as per pre-operative assessment  Comments: Pt refused to take out dentures. LMA inserted and secured. No evidence of trauma - noted gold cap intact as preop. Dental risks explained preop.

## 2013-04-25 NOTE — Op Note (Deleted)
NAME:  Casey, Brennan NO.:  [id number]  MEDICAL RECORD NO.:  [id number]  LOCATION:                                 FACILITY:  PHYSICIAN:  Sebastian Ache, MD     DATE OF BIRTH:  10/06/1960  DATE OF PROCEDURE: 04/25/2013 DATE OF DISCHARGE:  04/25/2013                              OPERATIVE REPORT   DIAGNOSES: 1. Gross hematuria. 2. Elevated PSA. 3. History of urethral sounding.  PROCEDURE: 1. Cystoscopy with bilateral retrograde pyelograms and interpretation. 2. Bladder biopsy with fulguration. 3. Prostate biopsy.  COMPLICATIONS:  None.  SPECIMENS: 1. Bladder erythema. 2. Prostatic urethra mucosal abnormality. 3. A 12 core prostate biopsy, all for permanent pathology.  FINDINGS: 1. Several tiny erythematous patches in the urinary bladder.  These     were biopsied appeared to be most consistent with irritation from     foreign body. 2. Unremarkable bilateral retrograde pyelograms. 3. Shaggy mucosal abnormality in the prostatic urethra, likely due to     irritation from foreign body.  These were biopsied. 4. Prostate volume 45.81 mL.  No median lobe.  The prostate dimensions     length 5.8, height 3.2, width 4.7.  INDICATION:  Casey Brennan is a pleasant 52 year old gentleman with a history of well controlled HIV as well as gross hematuria.  He was found on workup of this not to have any significant abnormalities, however, he does admit to practice of urethral sounding, passed a large metallic objects per urethra.  He also has a significant elevated PSA.  Options were discussed including, prostate biopsy and operative endoscopy to further rule out radiolucent stones given his history of Protease inhibitor use for atypical causes such as hemorrhagic cystitis given his HIV.  He wished to proceed.  Informed consent was obtained and placed in the medical record.  PROCEDURE IN DETAIL:  The patient being Casey Brennan, procedure being cystoscopy, bilateral  retrograde pyelogram, possible bladder biopsy and prostate biopsy was confirmed.  Procedure carried out.  Time-out was aforementioned.  Intravenous antibiotics administered.  General LMA anesthesia was introduced.  The patient was placed into a low lithotomy position.  Sterile field was created by prepping and draping the patient's penis, perineum, proximal thighs using iodine x3.  Next, cystourethroscopy performed using a 22-French rigid scope with 12-degree offset lens.  Inspection of the anterior and posterior urethra only revealed some shaggy mucosal abnormality to the prostatic urethra.  This did not difficulty in appearance of transition cell carcinoma.  It did appear to be somewhat consistent with possible irritation from foreign body.  Inspection of urinary bladder revealed no diverticula, calcifications, or papular lesions.  There were several small erythematous patches mostly on the posterior wall.  These also appear to be consistent with possible foreign body irritation, however, carcinoma in situ cannot be ruled out.  Attention was then directed to bilateral retrograde pyelogram, 1st on the right side.  The right ureter was cannulated with a 6-French end-hole catheter and right retrograde was obtained.  Right retrograde pyelogram demonstrated a single right ureter, single system, right kidney.  No filling defects or narrowing noted.  There is several bubbles noted that were seen to move  in the perfectly round and not consistent filling defects.  Similarly, left retrograde pyelogram was obtained.  Left retrograde pyelogram demonstrates single left ureter, single system left kidney.  No filling defects, narrowing, or hydronephrosis noted. There are also some bubbles on this side that perfectly round and mobile.  No filling defects.  Attention was then directed to biopsy of the erythematous bladder lesions.  Cold cup biopsy forceps were used to biopsy to the small lesions at  the level of the mucosa lesion set aside labeled bladder biopsy erythema at the base of these were fulgurated with coagulation current using sterile water and no evidence of perforation was noted.  Similarly, a small biopsy was taken of the prostatic urethra abnormality, this set aside for permanent pathology in the base of this was similarly coagulated.  Hemostasis appeared excellent.  The cystoscope was exchanged for a 20-French Foley catheter with 10 mL sterile water in the balloon next to straight drain. Attention was then directed to the prostate biopsy.  Using transverse ultrasound guidance, the prostate was 1st measured as per above and 12 core template biopsy was performed including specimens from the right lateral base, right lateral mid, right lateral apex, right mid base, right mid medial, right mid apex, left lateral base, left lateral mid, left lateral apex, left mid base, left mid, mid, left mid apex.  All the specimens were sent separately for permanent pathology, following the maneuvers.  The patient also had excellent hemostasis per rectum.  Foley catheters next to straight drain.  Procedure was then terminated.  The patient tolerated the procedure well.  There were no immediate periprocedural complications.  The patient was taken to the postanesthesia care unit in stable condition.          ______________________________ Sebastian Ache, MD     TM/MEDQ  D:  04/25/2013  T:  04/25/2013  Job:  [id number]

## 2013-04-28 ENCOUNTER — Ambulatory Visit: Payer: Medicare Other | Admitting: Hematology and Oncology

## 2013-04-28 ENCOUNTER — Encounter (HOSPITAL_BASED_OUTPATIENT_CLINIC_OR_DEPARTMENT_OTHER): Payer: Self-pay | Admitting: Urology

## 2013-04-28 DIAGNOSIS — R310 Gross hematuria: Secondary | ICD-10-CM | POA: Diagnosis not present

## 2013-05-08 ENCOUNTER — Other Ambulatory Visit: Payer: Self-pay

## 2013-05-12 DIAGNOSIS — C61 Malignant neoplasm of prostate: Secondary | ICD-10-CM | POA: Diagnosis not present

## 2013-05-12 DIAGNOSIS — N281 Cyst of kidney, acquired: Secondary | ICD-10-CM | POA: Diagnosis not present

## 2013-05-12 DIAGNOSIS — R310 Gross hematuria: Secondary | ICD-10-CM | POA: Diagnosis not present

## 2013-05-16 ENCOUNTER — Encounter: Payer: Self-pay | Admitting: Radiation Oncology

## 2013-05-16 ENCOUNTER — Other Ambulatory Visit: Payer: Self-pay | Admitting: Neurology

## 2013-05-16 DIAGNOSIS — Z8546 Personal history of malignant neoplasm of prostate: Secondary | ICD-10-CM | POA: Insufficient documentation

## 2013-05-16 MED ORDER — TIZANIDINE HCL 6 MG PO CAPS: 6.0000 mg | ORAL_CAPSULE | Freq: Three times a day (TID) | ORAL | Status: DC | PRN

## 2013-05-16 NOTE — Progress Notes (Signed)
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 no  Pacemaker/ICD? no  Possible current pregnancy? na  Is the patient on methotrexate? no  Current Complaints / other details:  Single, truck driver, currently not working, HIV since 1988. Sister w/pt today. IPSS 4. Pt will see a dr at [facility] , urologist oncologist, for second opinion.

## 2013-05-20 ENCOUNTER — Ambulatory Visit
Admission: RE | Admit: 2013-05-20 | Discharge: 2013-05-20 | Disposition: A | Discharge status: Home / Self Care | Payer: Medicare Other | Source: Ambulatory Visit | Attending: Radiation Oncology | Admitting: Radiation Oncology

## 2013-05-20 ENCOUNTER — Encounter: Payer: Self-pay | Admitting: Radiation Oncology

## 2013-05-20 VITALS — BP 136/89 | HR 80 | Temp 98.7°F | Resp 20 | Ht 69.0 in | Wt 212.0 lb

## 2013-05-20 DIAGNOSIS — C61 Malignant neoplasm of prostate: Secondary | ICD-10-CM | POA: Insufficient documentation

## 2013-05-20 HISTORY — DX: Unspecified osteoarthritis, unspecified site: M19.90

## 2013-05-20 HISTORY — DX: Reserved for concepts with insufficient information to code with codable children: IMO0002

## 2013-05-20 HISTORY — DX: Malignant neoplasm of prostate: C61

## 2013-05-20 NOTE — Progress Notes (Signed)
Please see the Nurse Progress Note in the MD Initial Consult Encounter for this patient. 

## 2013-05-20 NOTE — Addendum Note (Signed)
Encounter addended by: Glennie Hawk, RN on: 05/20/2013  3:58 PM<BR>     Documentation filed: Charges VN

## 2013-05-20 NOTE — Progress Notes (Signed)
[facility] Radiation Oncology NEW PATIENT EVALUATION  Name: Casey Brennan MRN: [id number]  Date:   05/20/2013           DOB: [birth year]  Status: outpatient   CC: Lorretta Harp, MD  Sebastian Ache, MD    REFERRING PHYSICIAN: Sebastian Ache, MD   DIAGNOSIS: Stage TI C. favorable risk adenocarcinoma prostate   HISTORY OF PRESENT ILLNESS:  GABERIAL Brennan is a 52 y.o. male who is seen today through the courtesy Dr. Berneice Heinrich for discussion of possible radiation therapy in the management of his stage TI C. favorable risk adenocarcinoma prostate. Undergoing evaluation for gross hematuria he was noted to have an elevated PSA of 4.82 this past September.. His PSA from March of this year was 3.44. He underwent ultrasound-guided biopsies on 04/25/2013. His then had Gleason 6 (3+3) involving 5/12 biopsies. He had less than 2% of one core from the right lateral apex, 5% of one core from the left lateral mid gland, 5% of one core from the left medial mid gland, 5% of one core from the left lateral apex and 70% of one core from the left medial apex. His gland volume was approximately 45.8 cc. He is doing well from a GU and GI standpoint. He is potent. His gross hematuria is felt to be secondary to traumatic injury.  PREVIOUS RADIATION THERAPY: No   PAST MEDICAL HISTORY:  has a past medical history of HIV (human immunodeficiency virus infection) (1988); Asthma; Chronic anemia; Chronic folliculitis; Gross hematuria; Elevated PSA; H/O pericarditis; History of concussion; Axillary lymphadenopathy; History of MRSA infection; History of gastric ulcer; CKD (chronic kidney disease), stage II; Bilateral renal cysts; Lytic bone lesion of hip; Wears glasses; GERD (gastroesophageal reflux disease); Headache(784.0); Prostate cancer (04/25/13); Ulcer; and Arthritis.     PAST SURGICAL HISTORY:  Past Surgical History  Procedure Laterality Date  . Upper leg soft tissue biopsy      left thigh  . Knee  arthroscopy Right 1985  . Colonoscopy  12/26/2011    Procedure: COLONOSCOPY;  Surgeon: Shirley Friar, MD;  Location: [facility];  Service: Endoscopy;  Laterality: N/A;  . Esophagogastroduodenoscopy  12/26/2011    Procedure: ESOPHAGOGASTRODUODENOSCOPY (EGD);  Surgeon: Shirley Friar, MD;  Location: [facility];  Service: Endoscopy;  Laterality: N/A;  . Cardiac catheterization  03-23-2009  DR COOPER    NORMAL CORONARY ARTERIES  . Excisional bx left breast mass/  i  &  d left breast abscess  03-24-2009  . Excision chronic left breast abscess  09-21-2010  . Cystoscopy/retrograde/ureteroscopy Bilateral 04/25/2013    Procedure: CYSTOSCOPY/ BILATERAL RETROGRADES; BLADDER BIOPSIES;  Surgeon: Sebastian Ache, MD;  Location: [facility];  Service: Urology;  Laterality: Bilateral;  . Prostate biopsy N/A 04/25/2013    Procedure: BIOPSY TRANSRECTAL ULTRASONIC PROSTATE (TUBP);  Surgeon: Sebastian Ache, MD;  Location: [facility];  Service: Urology;  Laterality: N/A;  . Left hip biopsy  10/2012  . Left axilla biopsy  04/2013     FAMILY HISTORY: family history includes Cancer in his father and maternal uncle; Diabetes in his father; Hypertension in his sister; Stroke in his father.  His father died in his 68s. His mother is alive and well at 60.   SOCIAL HISTORY:  reports that he quit smoking about 2 years ago. His smoking use included Cigars. He has never used smokeless tobacco. He reports that he does not drink alcohol or use illicit drugs. Divorced, one son age 52. Previously worked  as a truck driver, currently on disability for history of head trauma. He also had a neck fracture after falling out of a tree.   ALLERGIES: Penicillins and Sulfonamide derivatives   MEDICATIONS:  Current Outpatient Prescriptions  Medication Sig Dispense Refill  . Darunavir Ethanolate (PREZISTA) 800 MG tablet Take 800 mg by mouth at bedtime. Take with Norvir and Truvada.      [name]  emtricitabine-tenofovir (TRUVADA) 200-300 MG per tablet Take 1 tablet by mouth at bedtime. Take with Prezista and Norvir.      . folic acid (FOLVITE) 1 MG tablet Take 1 mg by mouth daily as needed (For Folic Acid Insufficiency.).       [name] gabapentin (NEURONTIN) 600 MG tablet Take 600 mg by mouth 4 (four) times daily as needed (For pain.).       [name] ipratropium (ATROVENT) 0.02 % nebulizer solution Take 500 mcg by nebulization 3 (three) times daily as needed for wheezing.      . levalbuterol (XOPENEX HFA) 45 MCG/ACT inhaler Inhale 1-2 puffs into the lungs every 4 (four) hours as needed for wheezing.  1 Inhaler  12  . levalbuterol (XOPENEX) 0.63 MG/3ML nebulizer solution Take 1 ampule by nebulization 3 (three) times daily as needed for wheezing.      [name] loratadine (CLARITIN) 10 MG tablet Take 10 mg by mouth as needed.       . Multiple Vitamin (MULTIVITAMIN WITH MINERALS) TABS Take 1 tablet by mouth at bedtime.       [name] omeprazole (PRILOSEC) 40 MG capsule Take 40 mg by mouth at bedtime.       . ondansetron (ZOFRAN) 4 MG tablet Take 1 tablet (4 mg total) by mouth every 8 (eight) hours as needed for nausea.  30 tablet  6  . QVAR 80 MCG/ACT inhaler Inhale 1 puff into the lungs daily as needed (wheezing).       . raltegravir (ISENTRESS) 400 MG tablet Take 800 mg by mouth at bedtime.       . ritonavir (NORVIR) 100 MG capsule Take 100 mg by mouth at bedtime. Take with Prezista and Truvada.      . tizanidine (ZANAFLEX) 6 MG capsule Take 1 capsule (6 mg total) by mouth 3 (three) times daily as needed for muscle spasms.  90 capsule  3  . traZODone (DESYREL) 50 MG tablet TAKE 1 TABLET BY MOUTH EVERY NIGHT AT BEDTIME  30 tablet  3  . zolpidem (AMBIEN CR) 12.5 MG CR tablet Take 1 tablet (12.5 mg total) by mouth at bedtime as needed. For sleep  30 tablet  0  . Naproxen Sodium (ALEVE) 220 MG CAPS Take by mouth as needed.      . [DISCONTINUED] famotidine (PEPCID AC) 10 MG tablet One po bid x 3 days and then prn upper  abdominal pain  20 tablet  0   No current facility-administered medications for this encounter.     REVIEW OF SYSTEMS:  Pertinent items are noted in HPI.    PHYSICAL EXAM:  height is 5\' 9"  (1.753 m) and weight is 212 lb (96.163 kg). His oral temperature is 98.7 F (37.1 C). His blood pressure is 136/89 and his pulse is 80. His respiration is 20.   Head and neck examination: Grossly unremarkable. Nodes: Without palpable cervical or supraclavicular adenopathy. Chest: Lungs clear. Back: Without spinal or CVA tenderness. Abdomen: Without masses organomegaly. Genitalia: Unremarkable to inspection. Rectal: The prostate gland is normal in size and is without focal induration or  nodularity. Extremities: Without edema.   LABORATORY DATA:  Lab Results  Component Value Date   WBC 4.6 04/17/2013   HGB 11.2* 04/25/2013   HCT 33.0* 04/25/2013   MCV 77.2* 04/17/2013   PLT 308 04/17/2013   Lab Results  Component Value Date   NA 142 04/25/2013   K 3.7 04/25/2013   CL 104 04/25/2013   CO2 25 02/28/2013   Lab Results  Component Value Date   ALT 11 02/24/2013   AST 14 02/24/2013   ALKPHOS 94 02/24/2013   BILITOT 1.40* 02/24/2013   PSA 4.82 from September 2014   IMPRESSION: Stage TI C. favorable risk adenocarcinoma prostate. I explained to the patient and his sister that his prognosis is related to his stage, PSA level, Gleason score. All are favorable. We discussed surgery versus close observation versus radiation therapy. I do not recommend observation considering his young age of 60. Radiation therapy options include seed implantation alone versus 8 weeks of external beam/IMRT. We discussed in detail the process of radiation therapy along with the potential acute and late toxicities. His prognosis is favorable.  I told him that based on his young age, I do favor surgery over radiation therapy.   PLAN: As discussed above. I believe that he'll get back in touch with Dr. Kathrynn Running for scheduling of his  surgery.  I spent 60 minutes minutes face to face with the patient and more than 50% of that time was spent in counseling and/or coordination of care.

## 2013-05-21 ENCOUNTER — Other Ambulatory Visit (INDEPENDENT_AMBULATORY_CARE_PROVIDER_SITE_OTHER): Payer: Medicare Other

## 2013-05-21 DIAGNOSIS — Z113 Encounter for screening for infections with a predominantly sexual mode of transmission: Secondary | ICD-10-CM

## 2013-05-21 DIAGNOSIS — B20 Human immunodeficiency virus [HIV] disease: Secondary | ICD-10-CM | POA: Diagnosis not present

## 2013-05-21 LAB — COMPREHENSIVE METABOLIC PANEL
ALT: 9 U/L (ref 0–53)
AST: 16 U/L (ref 0–37)
Albumin: 4.2 g/dL (ref 3.5–5.2)
Alkaline Phosphatase: 84 U/L (ref 39–117)
BUN: 8 mg/dL (ref 6–23)
CO2: 26 mEq/L (ref 19–32)
Calcium: 9 mg/dL (ref 8.4–10.5)
Chloride: 104 mEq/L (ref 96–112)
Creat: 1.29 mg/dL (ref 0.50–1.35)
Glucose, Bld: 122 mg/dL — ABNORMAL HIGH (ref 70–99)
Potassium: 4.1 mEq/L (ref 3.5–5.3)
Sodium: 139 mEq/L (ref 135–145)
Total Bilirubin: 0.5 mg/dL (ref 0.3–1.2)
Total Protein: 7.2 g/dL (ref 6.0–8.3)

## 2013-05-21 LAB — CBC
HCT: 32.7 % — ABNORMAL LOW (ref 39.0–52.0)
Hemoglobin: 10 g/dL — ABNORMAL LOW (ref 13.0–17.0)
MCH: 24.7 pg — ABNORMAL LOW (ref 26.0–34.0)
MCHC: 30.6 g/dL (ref 30.0–36.0)
MCV: 80.7 fL (ref 78.0–100.0)
Platelets: 260 10*3/uL (ref 150–400)
RBC: 4.05 MIL/uL — ABNORMAL LOW (ref 4.22–5.81)
RDW: 22.2 % — ABNORMAL HIGH (ref 11.5–15.5)
WBC: 4.1 10*3/uL (ref 4.0–10.5)

## 2013-05-22 LAB — T-HELPER CELL (CD4) - (RCID CLINIC ONLY)
CD4 % Helper T Cell: 19 % — ABNORMAL LOW (ref 33–55)
CD4 T Cell Abs: 330 /uL — ABNORMAL LOW (ref 400–2700)

## 2013-05-22 LAB — HIV-1 RNA QUANT-NO REFLEX-BLD
HIV 1 RNA Quant: <20 copies/mL (ref <20)
HIV-1 RNA Quant, Log: <1.3 {Log} (ref <1.30)

## 2013-05-31 ENCOUNTER — Emergency Department (HOSPITAL_COMMUNITY): Payer: Medicare Other

## 2013-05-31 ENCOUNTER — Telehealth: Payer: Self-pay | Admitting: Internal Medicine

## 2013-05-31 ENCOUNTER — Encounter (HOSPITAL_COMMUNITY): Payer: Self-pay | Admitting: Emergency Medicine

## 2013-05-31 ENCOUNTER — Inpatient Hospital Stay (HOSPITAL_COMMUNITY)
Admission: EM | Admit: 2013-05-31 | Discharge: 2013-06-04 | DRG: 975 | Disposition: A | Discharge status: Home / Self Care | Payer: Medicare Other | Attending: Internal Medicine | Admitting: Internal Medicine

## 2013-05-31 DIAGNOSIS — R109 Unspecified abdominal pain: Secondary | ICD-10-CM

## 2013-05-31 DIAGNOSIS — Z8614 Personal history of Methicillin resistant Staphylococcus aureus infection: Secondary | ICD-10-CM | POA: Diagnosis not present

## 2013-05-31 DIAGNOSIS — M545 Low back pain, unspecified: Secondary | ICD-10-CM | POA: Diagnosis not present

## 2013-05-31 DIAGNOSIS — B20 Human immunodeficiency virus [HIV] disease: Principal | ICD-10-CM | POA: Diagnosis present

## 2013-05-31 DIAGNOSIS — K5900 Constipation, unspecified: Secondary | ICD-10-CM | POA: Diagnosis not present

## 2013-05-31 DIAGNOSIS — D509 Iron deficiency anemia, unspecified: Secondary | ICD-10-CM

## 2013-05-31 DIAGNOSIS — J309 Allergic rhinitis, unspecified: Secondary | ICD-10-CM

## 2013-05-31 DIAGNOSIS — Z8546 Personal history of malignant neoplasm of prostate: Secondary | ICD-10-CM | POA: Diagnosis present

## 2013-05-31 DIAGNOSIS — Z87891 Personal history of nicotine dependence: Secondary | ICD-10-CM

## 2013-05-31 DIAGNOSIS — K219 Gastro-esophageal reflux disease without esophagitis: Secondary | ICD-10-CM | POA: Diagnosis present

## 2013-05-31 DIAGNOSIS — F0781 Postconcussional syndrome: Secondary | ICD-10-CM | POA: Diagnosis not present

## 2013-05-31 DIAGNOSIS — N390 Urinary tract infection, site not specified: Secondary | ICD-10-CM | POA: Diagnosis not present

## 2013-05-31 DIAGNOSIS — C61 Malignant neoplasm of prostate: Secondary | ICD-10-CM | POA: Diagnosis present

## 2013-05-31 DIAGNOSIS — Z79899 Other long term (current) drug therapy: Secondary | ICD-10-CM | POA: Diagnosis not present

## 2013-05-31 DIAGNOSIS — A419 Sepsis, unspecified organism: Secondary | ICD-10-CM | POA: Diagnosis not present

## 2013-05-31 DIAGNOSIS — R6510 Systemic inflammatory response syndrome (SIRS) of non-infectious origin without acute organ dysfunction: Secondary | ICD-10-CM

## 2013-05-31 DIAGNOSIS — I10 Essential (primary) hypertension: Secondary | ICD-10-CM | POA: Diagnosis present

## 2013-05-31 DIAGNOSIS — J45909 Unspecified asthma, uncomplicated: Secondary | ICD-10-CM

## 2013-05-31 DIAGNOSIS — N182 Chronic kidney disease, stage 2 (mild): Secondary | ICD-10-CM

## 2013-05-31 DIAGNOSIS — R509 Fever, unspecified: Secondary | ICD-10-CM | POA: Diagnosis not present

## 2013-05-31 DIAGNOSIS — Z21 Asymptomatic human immunodeficiency virus [HIV] infection status: Secondary | ICD-10-CM | POA: Diagnosis not present

## 2013-05-31 DIAGNOSIS — G4700 Insomnia, unspecified: Secondary | ICD-10-CM

## 2013-05-31 DIAGNOSIS — I129 Hypertensive chronic kidney disease with stage 1 through stage 4 chronic kidney disease, or unspecified chronic kidney disease: Secondary | ICD-10-CM | POA: Diagnosis present

## 2013-05-31 DIAGNOSIS — R51 Headache: Secondary | ICD-10-CM

## 2013-05-31 DIAGNOSIS — M47817 Spondylosis without myelopathy or radiculopathy, lumbosacral region: Secondary | ICD-10-CM | POA: Diagnosis not present

## 2013-05-31 DIAGNOSIS — R079 Chest pain, unspecified: Secondary | ICD-10-CM | POA: Diagnosis not present

## 2013-05-31 DIAGNOSIS — K8020 Calculus of gallbladder without cholecystitis without obstruction: Secondary | ICD-10-CM | POA: Diagnosis not present

## 2013-05-31 DIAGNOSIS — K6289 Other specified diseases of anus and rectum: Secondary | ICD-10-CM | POA: Diagnosis present

## 2013-05-31 LAB — CBC WITH DIFFERENTIAL/PLATELET
Basophils Absolute: 0 10*3/uL (ref 0.0–0.1)
Basophils Relative: 1 % (ref 0–1)
Eosinophils Absolute: 0.3 10*3/uL (ref 0.0–0.7)
Eosinophils Relative: 4 % (ref 0–5)
HCT: 31 % — ABNORMAL LOW (ref 39.0–52.0)
Hemoglobin: 10.1 g/dL — ABNORMAL LOW (ref 13.0–17.0)
Lymphocytes Relative: 13 % (ref 12–46)
Lymphs Abs: 1 10*3/uL (ref 0.7–4.0)
MCH: 25.1 pg — ABNORMAL LOW (ref 26.0–34.0)
MCHC: 32.6 g/dL (ref 30.0–36.0)
MCV: 76.9 fL — ABNORMAL LOW (ref 78.0–100.0)
Monocytes Absolute: 0.8 10*3/uL (ref 0.1–1.0)
Monocytes Relative: 10 % (ref 3–12)
Neutro Abs: 5.6 10*3/uL (ref 1.7–7.7)
Neutrophils Relative %: 73 % (ref 43–77)
Platelets: 192 10*3/uL (ref 150–400)
RBC: 4.03 MIL/uL — ABNORMAL LOW (ref 4.22–5.81)
RDW: 20.9 % — ABNORMAL HIGH (ref 11.5–15.5)
WBC: 7.7 10*3/uL (ref 4.0–10.5)

## 2013-05-31 LAB — URINALYSIS, ROUTINE W REFLEX MICROSCOPIC
Bilirubin Urine: NEGATIVE
Glucose, UA: NEGATIVE mg/dL
Hgb urine dipstick: NEGATIVE
Ketones, ur: NEGATIVE mg/dL
Nitrite: NEGATIVE
Protein, ur: NEGATIVE mg/dL
Specific Gravity, Urine: 1.012 (ref 1.005–1.030)
Urobilinogen, UA: 0.2 mg/dL (ref 0.0–1.0)
pH: 6.5 (ref 5.0–8.0)

## 2013-05-31 LAB — COMPREHENSIVE METABOLIC PANEL
ALT: 10 U/L (ref 0–53)
AST: 15 U/L (ref 0–37)
Albumin: 3.6 g/dL (ref 3.5–5.2)
Alkaline Phosphatase: 104 U/L (ref 39–117)
BUN: 9 mg/dL (ref 6–23)
CO2: 27 mEq/L (ref 19–32)
Calcium: 8.8 mg/dL (ref 8.4–10.5)
Chloride: 99 mEq/L (ref 96–112)
Creatinine, Ser: 1.19 mg/dL (ref 0.50–1.35)
GFR calc Af Amer: 81 mL/min — ABNORMAL LOW (ref >90)
GFR calc non Af Amer: 70 mL/min — ABNORMAL LOW (ref >90)
Glucose, Bld: 105 mg/dL — ABNORMAL HIGH (ref 70–99)
Potassium: 3.6 mEq/L (ref 3.5–5.1)
Sodium: 136 mEq/L (ref 135–145)
Total Bilirubin: 0.3 mg/dL (ref 0.3–1.2)
Total Protein: 7.2 g/dL (ref 6.0–8.3)

## 2013-05-31 LAB — URINE MICROSCOPIC-ADD ON

## 2013-05-31 LAB — LIPASE, BLOOD: Lipase: 55 U/L (ref 11–59)

## 2013-05-31 LAB — CG4 I-STAT (LACTIC ACID): Lactic Acid, Venous: 1.51 mmol/L (ref 0.5–2.2)

## 2013-05-31 MED ORDER — TIZANIDINE HCL 6 MG PO CAPS
6.0000 mg | ORAL_CAPSULE | Freq: Three times a day (TID) | ORAL | Status: DC | PRN
  Filled 2013-05-31: qty 1

## 2013-05-31 MED ORDER — RITONAVIR 100 MG PO CAPS
100.0000 mg | ORAL_CAPSULE | Freq: Every day | ORAL | Status: DC
  Administered 2013-05-31 – 2013-06-03 (×4): 100 mg via ORAL
  Filled 2013-05-31 (×5): qty 1

## 2013-05-31 MED ORDER — SODIUM CHLORIDE 0.9 % IV BOLUS (SEPSIS)
1000.0000 mL | Freq: Once | INTRAVENOUS | Status: AC
  Administered 2013-05-31: 1000 mL via INTRAVENOUS

## 2013-05-31 MED ORDER — SODIUM CHLORIDE 0.9 % IV SOLN
500.0000 mg | Freq: Three times a day (TID) | INTRAVENOUS | Status: DC
  Administered 2013-05-31: 500 mg via INTRAVENOUS
  Administered 2013-05-31: 23:00:00 via INTRAVENOUS
  Administered 2013-05-31 – 2013-06-01 (×3): 500 mg via INTRAVENOUS
  Filled 2013-05-31 (×8): qty 500

## 2013-05-31 MED ORDER — ALBUTEROL SULFATE (5 MG/ML) 0.5% IN NEBU
2.5000 mg | INHALATION_SOLUTION | Freq: Four times a day (QID) | RESPIRATORY_TRACT | Status: DC
  Administered 2013-05-31: 2.5 mg via RESPIRATORY_TRACT
  Filled 2013-05-31 (×2): qty 0.5

## 2013-05-31 MED ORDER — FLUTICASONE PROPIONATE HFA 44 MCG/ACT IN AERO
1.0000 | INHALATION_SPRAY | Freq: Two times a day (BID) | RESPIRATORY_TRACT | Status: DC
  Administered 2013-05-31: 1 via RESPIRATORY_TRACT
  Filled 2013-05-31: qty 10.6

## 2013-05-31 MED ORDER — HEPARIN SODIUM (PORCINE) 5000 UNIT/ML IJ SOLN
5000.0000 [IU] | Freq: Three times a day (TID) | INTRAMUSCULAR | Status: DC
  Administered 2013-05-31 – 2013-06-04 (×10): 5000 [IU] via SUBCUTANEOUS
  Filled 2013-05-31 (×15): qty 1

## 2013-05-31 MED ORDER — HYDROMORPHONE HCL PF 1 MG/ML IJ SOLN
1.0000 mg | Freq: Once | INTRAMUSCULAR | Status: AC
  Administered 2013-05-31: 1 mg via INTRAVENOUS

## 2013-05-31 MED ORDER — LEVALBUTEROL TARTRATE 45 MCG/ACT IN AERO
1.0000 | INHALATION_SPRAY | RESPIRATORY_TRACT | Status: DC | PRN
  Filled 2013-05-31: qty 15

## 2013-05-31 MED ORDER — ACETAMINOPHEN 650 MG RE SUPP
650.0000 mg | Freq: Four times a day (QID) | RECTAL | Status: DC | PRN
  Filled 2013-05-31: qty 1

## 2013-05-31 MED ORDER — IPRATROPIUM BROMIDE 0.02 % IN SOLN: 500.0000 ug | Freq: Three times a day (TID) | RESPIRATORY_TRACT | Status: DC | PRN

## 2013-05-31 MED ORDER — PANTOPRAZOLE SODIUM 40 MG PO TBEC
40.0000 mg | DELAYED_RELEASE_TABLET | Freq: Every day | ORAL | Status: DC
  Administered 2013-05-31 – 2013-06-04 (×5): 40 mg via ORAL
  Filled 2013-05-31 (×5): qty 1

## 2013-05-31 MED ORDER — TRAZODONE HCL 50 MG PO TABS
50.0000 mg | ORAL_TABLET | Freq: Every day | ORAL | Status: DC
  Administered 2013-05-31 – 2013-06-03 (×4): 50 mg via ORAL
  Filled 2013-05-31 (×5): qty 1

## 2013-05-31 MED ORDER — SODIUM CHLORIDE 0.9 % IJ SOLN
3.0000 mL | Freq: Two times a day (BID) | INTRAMUSCULAR | Status: DC
Start: 2013-05-31 — End: 2013-06-04
  Administered 2013-05-31 – 2013-06-04 (×8): 3 mL via INTRAVENOUS

## 2013-05-31 MED ORDER — KETOROLAC TROMETHAMINE 30 MG/ML IJ SOLN
30.0000 mg | Freq: Once | INTRAMUSCULAR | Status: AC
  Administered 2013-05-31: 30 mg via INTRAVENOUS
  Filled 2013-05-31: qty 1

## 2013-05-31 MED ORDER — ONDANSETRON HCL 4 MG/2ML IJ SOLN
4.0000 mg | Freq: Four times a day (QID) | INTRAMUSCULAR | Status: DC | PRN
  Administered 2013-06-01 – 2013-06-03 (×3): 4 mg via INTRAVENOUS
  Filled 2013-05-31 (×4): qty 2

## 2013-05-31 MED ORDER — EMTRICITABINE-TENOFOVIR DF 200-300 MG PO TABS
1.0000 | ORAL_TABLET | Freq: Every day | ORAL | Status: DC
  Administered 2013-05-31 – 2013-06-03 (×4): 1 via ORAL
  Filled 2013-05-31 (×5): qty 1

## 2013-05-31 MED ORDER — FOLIC ACID 1 MG PO TABS
1.0000 mg | ORAL_TABLET | Freq: Every day | ORAL | Status: DC | PRN
  Filled 2013-05-31: qty 1

## 2013-05-31 MED ORDER — MORPHINE SULFATE 2 MG/ML IJ SOLN
1.0000 mg | INTRAMUSCULAR | Status: DC | PRN
Start: 2013-05-31 — End: 2013-06-04
  Administered 2013-05-31 – 2013-06-02 (×3): 1 mg via INTRAVENOUS
  Filled 2013-05-31 (×3): qty 1

## 2013-05-31 MED ORDER — ONDANSETRON HCL 4 MG PO TABS
4.0000 mg | ORAL_TABLET | Freq: Four times a day (QID) | ORAL | Status: DC | PRN
  Administered 2013-06-01: 4 mg via ORAL
  Filled 2013-05-31 (×2): qty 1

## 2013-05-31 MED ORDER — RALTEGRAVIR POTASSIUM 400 MG PO TABS
800.0000 mg | ORAL_TABLET | Freq: Every day | ORAL | Status: DC
  Administered 2013-05-31: 800 mg via ORAL
  Filled 2013-05-31 (×2): qty 2

## 2013-05-31 MED ORDER — DARUNAVIR ETHANOLATE 800 MG PO TABS
800.0000 mg | ORAL_TABLET | Freq: Every day | ORAL | Status: DC
  Administered 2013-05-31 – 2013-06-03 (×4): 800 mg via ORAL
  Filled 2013-05-31 (×5): qty 1

## 2013-05-31 MED ORDER — GABAPENTIN 600 MG PO TABS
600.0000 mg | ORAL_TABLET | Freq: Four times a day (QID) | ORAL | Status: DC | PRN
  Administered 2013-06-02 – 2013-06-03 (×3): 600 mg via ORAL
  Filled 2013-05-31 (×3): qty 1

## 2013-05-31 MED ORDER — ACETAMINOPHEN 500 MG PO TABS
1000.0000 mg | ORAL_TABLET | Freq: Once | ORAL | Status: AC
  Administered 2013-05-31: 1000 mg via ORAL
  Filled 2013-05-31: qty 2

## 2013-05-31 MED ORDER — DIPHENHYDRAMINE HCL 50 MG/ML IJ SOLN
25.0000 mg | Freq: Once | INTRAMUSCULAR | Status: AC
  Administered 2013-05-31: 25 mg via INTRAVENOUS
  Filled 2013-05-31: qty 1

## 2013-05-31 MED ORDER — DEXTROSE 5 % IV SOLN: 1.0000 g | Freq: Two times a day (BID) | INTRAVENOUS | Status: DC

## 2013-05-31 MED ORDER — ACETAMINOPHEN 325 MG PO TABS
650.0000 mg | ORAL_TABLET | Freq: Four times a day (QID) | ORAL | Status: DC | PRN
  Administered 2013-05-31 – 2013-06-02 (×4): 650 mg via ORAL
  Filled 2013-05-31 (×4): qty 2

## 2013-05-31 MED ORDER — LORATADINE 10 MG PO TABS
10.0000 mg | ORAL_TABLET | Freq: Every day | ORAL | Status: DC | PRN
  Filled 2013-05-31: qty 1

## 2013-05-31 MED ORDER — HYDROMORPHONE HCL PF 1 MG/ML IJ SOLN
INTRAMUSCULAR | Status: AC
  Filled 2013-05-31: qty 1

## 2013-05-31 NOTE — ED Notes (Signed)
Pt. Woke up with a fever and n/v/ at 330 am.  Pt 's fever at home was over 101.  Did not take medication.  Pt. Is having lower back pain and pelvic Denies an UTI symptoms
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
## 2013-05-31 NOTE — ED Provider Notes (Signed)
CSN: [id number]     Arrival date & time 05/31/13  1035 History   First MD Initiated Contact with Patient 05/31/13 1108     Chief Complaint  Patient presents with  . Fever    HPI  Patient presents with fevers, chills, generalized discomfort. Patient also has ongoing low back, lower abdominal pain, though this seems chronic. Patient was well until yesterday. Patient has multiple medical problems, including HIV, prostate cancer. No ongoing chemotherapy, or radiation therapy, as the patient is evaluating options for treatment. Last CD4 count, viral load were 330, less than 20 respectively. Patient denies confusion, disorientation, vomiting, diarrhea, urinary changes. History of present illness is per the patient and his family member.   Past Medical History  Diagnosis Date  . HIV (human immunodeficiency virus infection) 1988  . Asthma   . Chronic anemia     normocytic  . Chronic folliculitis   . Gross hematuria   . Elevated PSA   . H/O pericarditis     2010--  myopercarditis--  resolved  . History of concussion     2012  &  2013  RESIDUAL HA'S --  RESOLVED  . Axillary lymphadenopathy   . History of MRSA infection     infected boil  . History of gastric ulcer   . CKD (chronic kidney disease), stage II     nephrologist-  dr patel (secondary to hiv vs htn)  . Bilateral renal cysts   . Lytic bone lesion of hip     WORK-UP DONE BY ONCOLOGIST DR HA --  NOT MALIGNANT  . Wears glasses   . GERD (gastroesophageal reflux disease)   . Headache(784.0)     HX SEVERE FRONTAL HA'S  . Prostate cancer 04/25/13    gleason 3+3=6, volume 30 gm  . Ulcer     hx of gastric  . Arthritis    Past Surgical History  Procedure Laterality Date  . Upper leg soft tissue biopsy      left thigh  . Knee arthroscopy Right 1985  . Colonoscopy  12/26/2011    Procedure: COLONOSCOPY;  Surgeon: Shirley Friar, MD;  Location: [facility];  Service: Endoscopy;  Laterality: N/A;  .  Esophagogastroduodenoscopy  12/26/2011    Procedure: ESOPHAGOGASTRODUODENOSCOPY (EGD);  Surgeon: Shirley Friar, MD;  Location: [facility];  Service: Endoscopy;  Laterality: N/A;  . Cardiac catheterization  03-23-2009  DR COOPER    NORMAL CORONARY ARTERIES  . Excisional bx left breast mass/  i  &  d left breast abscess  03-24-2009  . Excision chronic left breast abscess  09-21-2010  . Cystoscopy/retrograde/ureteroscopy Bilateral 04/25/2013    Procedure: CYSTOSCOPY/ BILATERAL RETROGRADES; BLADDER BIOPSIES;  Surgeon: Sebastian Ache, MD;  Location: [facility];  Service: Urology;  Laterality: Bilateral;  . Prostate biopsy N/A 04/25/2013    Procedure: BIOPSY TRANSRECTAL ULTRASONIC PROSTATE (TUBP);  Surgeon: Sebastian Ache, MD;  Location: [facility];  Service: Urology;  Laterality: N/A;  . Left hip biopsy  10/2012  . Left axilla biopsy  04/2013   Family History  Problem Relation Age of Onset  . Stroke Father   . Diabetes Father   . Cancer Father     brain cancer  . Hypertension Sister   . Cancer Maternal Uncle     prostate cancer   History  Substance Use Topics  . Smoking status: Former Smoker    Types: Cigars    Quit date: 05/22/2010  . Smokeless tobacco: Never Used  .  Alcohol Use: No     Comment: rare    Review of Systems  Constitutional:       Per HPI, otherwise negative  HENT:       Per HPI, otherwise negative  Respiratory:       Per HPI, otherwise negative  Cardiovascular:       Per HPI, otherwise negative  Gastrointestinal: Negative for vomiting.  Endocrine:       Negative aside from HPI  Genitourinary:       Neg aside from HPI   Musculoskeletal:       Per HPI, otherwise negative  Skin: Negative.   Neurological: Positive for headaches. Negative for dizziness, syncope, weakness, light-headedness and numbness.    Allergies  Penicillins and Sulfonamide derivatives  Home Medications   Current Outpatient Rx  Name  Route  Sig   Dispense  Refill  . Darunavir Ethanolate (PREZISTA) 800 MG tablet   Oral   Take 800 mg by mouth at bedtime. Take with Norvir and Truvada.         [name] emtricitabine-tenofovir (TRUVADA) 200-300 MG per tablet   Oral   Take 1 tablet by mouth at bedtime. Take with Prezista and Norvir.         . folic acid (FOLVITE) 1 MG tablet   Oral   Take 1 mg by mouth daily as needed (For Folic Acid Insufficiency.).          [name] gabapentin (NEURONTIN) 600 MG tablet   Oral   Take 600 mg by mouth 4 (four) times daily as needed (For pain.).          [name] ipratropium (ATROVENT) 0.02 % nebulizer solution   Nebulization   Take 500 mcg by nebulization 3 (three) times daily as needed for wheezing.         . levalbuterol (XOPENEX HFA) 45 MCG/ACT inhaler   Inhalation   Inhale 1-2 puffs into the lungs every 4 (four) hours as needed for wheezing.   1 Inhaler   12   . levalbuterol (XOPENEX) 0.63 MG/3ML nebulizer solution   Nebulization   Take 1 ampule by nebulization 3 (three) times daily as needed for wheezing.         [name] loratadine (CLARITIN) 10 MG tablet   Oral   Take 10 mg by mouth daily as needed for allergies.          . Multiple Vitamin (MULTIVITAMIN WITH MINERALS) TABS   Oral   Take 1 tablet by mouth at bedtime.          [name] omeprazole (PRILOSEC) 40 MG capsule   Oral   Take 40 mg by mouth at bedtime.          . ondansetron (ZOFRAN) 4 MG tablet   Oral   Take 1 tablet (4 mg total) by mouth every 8 (eight) hours as needed for nausea.   30 tablet   6   . QVAR 80 MCG/ACT inhaler   Inhalation   Inhale 1 puff into the lungs daily as needed (wheezing).          . raltegravir (ISENTRESS) 400 MG tablet   Oral   Take 800 mg by mouth at bedtime.          . ritonavir (NORVIR) 100 MG capsule   Oral   Take 100 mg by mouth at bedtime. Take with Prezista and Truvada.         . tizanidine (ZANAFLEX) 6 MG capsule   Oral  Take 1 capsule (6 mg total) by mouth 3 (three) times daily as  needed for muscle spasms.   90 capsule   3   . traZODone (DESYREL) 50 MG tablet   Oral   Take 50 mg by mouth at bedtime.         [name] zolpidem (AMBIEN CR) 12.5 MG CR tablet   Oral   Take 1 tablet (12.5 mg total) by mouth at bedtime as needed. For sleep   30 tablet   0    BP 169/91  Pulse 102  Temp(Src) 99.6 F (37.6 C) (Oral)  Resp 20  Ht 5\' 9"  (1.753 m)  Wt 206 lb 5 oz (93.583 kg)  BMI 30.45 kg/m2  SpO2 100% Physical Exam  Nursing note and vitals reviewed. Constitutional: He is oriented to person, place, and time. He appears well-developed. No distress.  HENT:  Head: Normocephalic and atraumatic.  Eyes: Conjunctivae and EOM are normal.  Cardiovascular: Regular rhythm.  Tachycardia present.   Pulmonary/Chest: Effort normal. No stridor. No respiratory distress.  Abdominal: He exhibits no distension.  Minimal discomfort anywhere, no guarding, no peritoneal findings.  Musculoskeletal: He exhibits no edema.  Neurological: He is alert and oriented to person, place, and time.  Skin: Skin is warm and dry.  Psychiatric: He has a normal mood and affect.    ED Course  Procedures (including critical care time) Labs Review Labs Reviewed  CBC WITH DIFFERENTIAL - Abnormal; Notable for the following:    RBC 4.03 (*)    Hemoglobin 10.1 (*)    HCT 31.0 (*)    MCV 76.9 (*)    MCH 25.1 (*)    RDW 20.9 (*)    All other components within normal limits  COMPREHENSIVE METABOLIC PANEL - Abnormal; Notable for the following:    Glucose, Bld 105 (*)    GFR calc non Af Amer 70 (*)    GFR calc Af Amer 81 (*)    All other components within normal limits  URINALYSIS, ROUTINE W REFLEX MICROSCOPIC - Abnormal; Notable for the following:    Leukocytes, UA SMALL (*)    All other components within normal limits  LIPASE, BLOOD  URINE MICROSCOPIC-ADD ON  CG4 I-STAT (LACTIC ACID)   Imaging Review No results found.  EKG Interpretation   None      after the initial evaluation I reviewed  the patient's chart, including recent lab evaluation, CT scan, PET scan.  Update: On the repeat examine patient has mild headache.  No neurologic complaints.  Update: Following return of labs I discussed findings with the patient and his companions.  Patient complains of left inguinal crease pain, left hip pain.  This seems to correspond to an area of previously identified osseous lesion.  On repeat exam with flexion and extension of the hip, range of motion is appropriate, though there is tenderness with axial loading in the anterior aspect of the hip.  Mild tenderness to palpation just superior to the left inguinal crease, no lesion identified.  Update: Patient now has fever, 103.  Blood cultures sent, Tylenol provided, CT scan ordered given the patient's description of left inguinal crease pain, though the patient continues to have no vomiting, no diarrhea.   Update: Patient's family member spoke with our urologist, with whom I discussed the patient's care.  Patient denies any ongoing urologic issues, though with his fairly recent prostate biopsy, some concern for bacteremia exists.  However, the patient's early labs do not demonstrate any lactic acidosis  or leukocytosis.  MDM  No diagnosis found. This patient with multiple medical problems, including HIV, prostate cancer now presents with concern of a subjective fever, headache, left inguinal crease pain, generalized discomfort.  Patient's initial evaluation was reassuring, though while here he developed fever, left inguinal crease pain requiring additional evaluation with CT scan, blood cultures, and admission for further evaluation and management.  On admission CT scan was pending.    Gerhard Munch, MD 05/31/13 [phone]

## 2013-05-31 NOTE — Progress Notes (Signed)
Casey Brennan [id number] Code Status: full   Admission Data: 05/31/2013 6:09 PM Attending Provider:  paya PCP:[facility], Brien Few, MD Consults/ Treatment Team:    Casey Brennan is a 52 y.o. male patient admitted from ED awake, alert - oriented  X 3 - no acute distress noted.  VSS - Blood pressure 99/49, pulse 101, temperature 103.3 F (39.6 C), temperature source Oral, resp. rate 21, height 5\' 9"  (1.753 m), weight 93.668 kg (206 lb 8 oz), SpO2 93.00%.  no c/o shortness of breath, no c/o chest pain. Cardiac tele # 05, in place, cardiac monitor yields:normal sinus rhythm.  IV Fluids:  IV in place, occlusive dsg intact without redness, IV cath antecubital right, condition patent and no redness normal saline.  Allergies:   Allergies  Allergen Reactions  . Penicillins Shortness Of Breath and Rash  . Sulfonamide Derivatives Shortness Of Breath and Rash     Past Medical History  Diagnosis Date  . HIV (human immunodeficiency virus infection) 1988  . Asthma   . Chronic anemia     normocytic  . Chronic folliculitis   . Gross hematuria   . Elevated PSA   . H/O pericarditis     2010--  myopercarditis--  resolved  . History of concussion     2012  &  2013  RESIDUAL HA'S --  RESOLVED  . Axillary lymphadenopathy   . History of MRSA infection     infected boil  . History of gastric ulcer   . CKD (chronic kidney disease), stage II     nephrologist-  dr patel (secondary to hiv vs htn)  . Bilateral renal cysts   . Lytic bone lesion of hip     WORK-UP DONE BY ONCOLOGIST DR HA --  NOT MALIGNANT  . Wears glasses   . GERD (gastroesophageal reflux disease)   . Headache(784.0)     HX SEVERE FRONTAL HA'S  . Prostate cancer 04/25/13    gleason 3+3=6, volume 30 gm  . Ulcer     hx of gastric  . Arthritis    Medications Prior to Admission  Medication Sig Dispense Refill  . Darunavir Ethanolate (PREZISTA) 800 MG tablet Take 800 mg by mouth at bedtime. Take with Norvir and Truvada.      [name]  emtricitabine-tenofovir (TRUVADA) 200-300 MG per tablet Take 1 tablet by mouth at bedtime. Take with Prezista and Norvir.      . folic acid (FOLVITE) 1 MG tablet Take 1 mg by mouth daily as needed (For Folic Acid Insufficiency.).       [name] gabapentin (NEURONTIN) 600 MG tablet Take 600 mg by mouth 4 (four) times daily as needed (For pain.).       [name] ipratropium (ATROVENT) 0.02 % nebulizer solution Take 500 mcg by nebulization 3 (three) times daily as needed for wheezing.      . levalbuterol (XOPENEX HFA) 45 MCG/ACT inhaler Inhale 1-2 puffs into the lungs every 4 (four) hours as needed for wheezing.  1 Inhaler  12  . levalbuterol (XOPENEX) 0.63 MG/3ML nebulizer solution Take 1 ampule by nebulization 3 (three) times daily as needed for wheezing.      [name] loratadine (CLARITIN) 10 MG tablet Take 10 mg by mouth daily as needed for allergies.       . Multiple Vitamin (MULTIVITAMIN WITH MINERALS) TABS Take 1 tablet by mouth at bedtime.       [name] omeprazole (PRILOSEC) 40 MG capsule Take 40 mg by mouth at bedtime.       [name]  ondansetron (ZOFRAN) 4 MG tablet Take 1 tablet (4 mg total) by mouth every 8 (eight) hours as needed for nausea.  30 tablet  6  . QVAR 80 MCG/ACT inhaler Inhale 1 puff into the lungs daily as needed (wheezing).       . raltegravir (ISENTRESS) 400 MG tablet Take 800 mg by mouth at bedtime.       . ritonavir (NORVIR) 100 MG capsule Take 100 mg by mouth at bedtime. Take with Prezista and Truvada.      . tizanidine (ZANAFLEX) 6 MG capsule Take 1 capsule (6 mg total) by mouth 3 (three) times daily as needed for muscle spasms.  90 capsule  3  . traZODone (DESYREL) 50 MG tablet Take 50 mg by mouth at bedtime.      [name] zolpidem (AMBIEN CR) 12.5 MG CR tablet Take 1 tablet (12.5 mg total) by mouth at bedtime as needed. For sleep  30 tablet  0   History:  obtained from the patient. Tobacco/alcohol: denied none  Orientation to room, and floor completed with information packet given to patient/family.  Patient  declined safety video at this time.  Admission INP armband ID verified with patient/family, and in place.   SR up x 2, fall assessment complete, with patient and family able to verbalize understanding of risk associated with falls, and verbalized understanding to call nsg before up out of bed.  Call light within reach, patient able to voice, and demonstrate understanding.  Skin, clean-dry- intact without evidence of bruising, or skin tears.   No evidence of skin break down noted on exam.     Will cont to eval and treat per MD orders.  Orvan Seen, RN 05/31/2013 6:09 PM

## 2013-05-31 NOTE — Telephone Encounter (Signed)
Sister called after hours line to mention that her brother has had fevers, chills, rigors, and headache starting last night. His temp is 101 this morning, feel poorly. She did mention that he had similar presentation of sinus infection in the past with similar prodrome. The patient denies having any respiratory symptoms. Recommended that they come to the ED for further evaluation due to high fevers, rigors, and malaise. Physical exam, and Infectious work up will help [place] determine if sinus vs. uti vs. Other source of infection.

## 2013-05-31 NOTE — ED Notes (Signed)
Lab results reported to Dr.Lockwood. 

## 2013-06-01 DIAGNOSIS — Z21 Asymptomatic human immunodeficiency virus [HIV] infection status: Secondary | ICD-10-CM

## 2013-06-01 DIAGNOSIS — F0781 Postconcussional syndrome: Secondary | ICD-10-CM | POA: Diagnosis not present

## 2013-06-01 DIAGNOSIS — M545 Low back pain, unspecified: Secondary | ICD-10-CM | POA: Diagnosis not present

## 2013-06-01 DIAGNOSIS — B20 Human immunodeficiency virus [HIV] disease: Secondary | ICD-10-CM | POA: Diagnosis not present

## 2013-06-01 DIAGNOSIS — R509 Fever, unspecified: Secondary | ICD-10-CM

## 2013-06-01 DIAGNOSIS — A419 Sepsis, unspecified organism: Secondary | ICD-10-CM | POA: Diagnosis not present

## 2013-06-01 DIAGNOSIS — N390 Urinary tract infection, site not specified: Secondary | ICD-10-CM | POA: Diagnosis not present

## 2013-06-01 LAB — IRON AND TIBC
Iron: 17 ug/dL — ABNORMAL LOW (ref 42–135)
Saturation Ratios: 6 % — ABNORMAL LOW (ref 20–55)
TIBC: 286 ug/dL (ref 215–435)
UIBC: 269 ug/dL (ref 125–400)

## 2013-06-01 LAB — FOLATE: Folate: 7 ng/mL

## 2013-06-01 LAB — BASIC METABOLIC PANEL
BUN: 10 mg/dL (ref 6–23)
CO2: 26 mEq/L (ref 19–32)
Calcium: 8.3 mg/dL — ABNORMAL LOW (ref 8.4–10.5)
Chloride: 103 mEq/L (ref 96–112)
Creatinine, Ser: 1.19 mg/dL (ref 0.50–1.35)
GFR calc Af Amer: 81 mL/min — ABNORMAL LOW (ref >90)
GFR calc non Af Amer: 70 mL/min — ABNORMAL LOW (ref >90)
Glucose, Bld: 97 mg/dL (ref 70–99)
Potassium: 3.5 mEq/L (ref 3.5–5.1)
Sodium: 138 mEq/L (ref 135–145)

## 2013-06-01 LAB — MAGNESIUM: Magnesium: 1.6 mg/dL (ref 1.5–2.5)

## 2013-06-01 LAB — CBC
HCT: 28.4 % — ABNORMAL LOW (ref 39.0–52.0)
Hemoglobin: 9.4 g/dL — ABNORMAL LOW (ref 13.0–17.0)
MCH: 25.3 pg — ABNORMAL LOW (ref 26.0–34.0)
MCHC: 33.1 g/dL (ref 30.0–36.0)
MCV: 76.3 fL — ABNORMAL LOW (ref 78.0–100.0)
Platelets: 172 10*3/uL (ref 150–400)
RBC: 3.72 MIL/uL — ABNORMAL LOW (ref 4.22–5.81)
RDW: 20.9 % — ABNORMAL HIGH (ref 11.5–15.5)
WBC: 7.7 10*3/uL (ref 4.0–10.5)

## 2013-06-01 LAB — FERRITIN: Ferritin: 20 ng/mL — ABNORMAL LOW (ref 22–322)

## 2013-06-01 LAB — VITAMIN B12: Vitamin B-12: 386 pg/mL (ref 211–911)

## 2013-06-01 LAB — RETICULOCYTES
RBC.: 3.72 MIL/uL — ABNORMAL LOW (ref 4.22–5.81)
Retic Count, Absolute: 52.1 10*3/uL (ref 19.0–186.0)
Retic Ct Pct: 1.4 % (ref 0.4–3.1)

## 2013-06-01 MED ORDER — RALTEGRAVIR POTASSIUM 400 MG PO TABS
400.0000 mg | ORAL_TABLET | Freq: Two times a day (BID) | ORAL | Status: DC
  Administered 2013-06-01 – 2013-06-04 (×6): 400 mg via ORAL
  Filled 2013-06-01 (×7): qty 1

## 2013-06-01 MED ORDER — HYDROMORPHONE HCL PF 1 MG/ML IJ SOLN
1.0000 mg | Freq: Once | INTRAMUSCULAR | Status: AC
  Administered 2013-06-01: 1 mg via INTRAVENOUS
  Filled 2013-06-01: qty 1

## 2013-06-01 MED ORDER — SODIUM CHLORIDE 0.9 % IV SOLN
500.0000 mg | Freq: Four times a day (QID) | INTRAVENOUS | Status: DC
  Administered 2013-06-01 – 2013-06-03 (×7): 500 mg via INTRAVENOUS
  Filled 2013-06-01 (×9): qty 500

## 2013-06-01 MED ORDER — IPRATROPIUM BROMIDE 0.02 % IN SOLN: 500.0000 ug | RESPIRATORY_TRACT | Status: DC | PRN

## 2013-06-01 MED ORDER — POTASSIUM CHLORIDE CRYS ER 20 MEQ PO TBCR
40.0000 meq | EXTENDED_RELEASE_TABLET | Freq: Once | ORAL | Status: AC
  Administered 2013-06-01: 40 meq via ORAL
  Filled 2013-06-01: qty 2

## 2013-06-01 MED ORDER — ALBUTEROL SULFATE (5 MG/ML) 0.5% IN NEBU: 2.5000 mg | INHALATION_SOLUTION | RESPIRATORY_TRACT | Status: DC | PRN

## 2013-06-01 NOTE — H&P (Signed)
INTERNAL MEDICINE TEACHING SERVICE Attending Admission Note  Date: 06/01/2013  Patient name: Casey Brennan  Medical record number: [id number]  Date of birth: [birth year]    I have seen and evaluated Leafy Half and discussed their care with the Residency Team.  52 yr old male with hx HIV (CD4- 330), CKD stage 2, hx prostate CA, presented due to acute left flank pain, rigors, and fever that began abruptly during the night. He states he began having urinary frequency at the time. He admitted to a mild HA at the time as well.  On admission, he was noted to have a fever of 103.3 F. Otherwise, hemodynamically stable. Initially BCx were taken and CT abdomen pelvis showed no obvious explanation for his fever and flank pain.  He was started empirically on imipenem due to PCN allergy.  On exam: GEN: AAOx3 NAD HEENT: EOMI, PERRLA, no icterus, no mucosal lesions. CV: S1S2, no m/r/g, RRR PULM: CTA bilat. ABD/GI: Soft, non-distended, +BS, no guarding, mild LLQ tenderness. GU: No adenopathy, no scrotal or penile ulcerations, no scrotal edema or tenderness. LE: 2/4 pulses, no c/c/e. NEURO: CN II-XII grossly intact.  -SIRS: He appears clinically improved today. BC taken. UA is not impressive. CT abd without clear source. I don't think he has cholecystitis. Continue imipenem for now. Consult ID for any further recs. Hemodynamically stable.  Jonah Blue, DO, FACP Faculty [facility] Internal Medicine Residency Program 06/01/2013, 2:09 PM

## 2013-06-01 NOTE — Progress Notes (Signed)
Utilization Review Completed.Casey Brennan T11/30/2014  

## 2013-06-01 NOTE — Progress Notes (Signed)
Patient slept through most of shift. Sister at bedside. Sister requests pain medications for patient. Physicians are aware.

## 2013-06-01 NOTE — Consult Note (Signed)
[facility]  Total days of antibiotics 2        Day 2 imipenem               Reason for Consult: fever in hiv patient   Referring Physician: paya  Principal Problem:   Fever of unknown origin Active Problems:   HIV INFECTION   HTN (hypertension)   Postconcussion syndrome   Prostate cancer    HPI: Casey Brennan is a 52 y.o. male with HIV, CD 4 count of 330/VL<20, on RLG/DRVr/truvada, noted to have < 24hr of fevers up to 101 at home, rigors, malaise on Friday night. Since he was still felt poorly on Saturday morning, he went to the ED for further evaluation where he was found to have temp of 103.3. He was otherwise hemodynamically stable. Mild tachy with HR 102. Wbc 7.7 no left shift. Due to complaint of left flank/inguinal pain, there was concern for pyelonephritis despite UA being clean. He was started on imipenem due to multiple abtx allergies. He underwent abd/pelvis CT which was normal. The patinet states he feels better since being admitted < 24hr. He deid subscribed to fevers, nightsweats overnight but feels that "it broke" this morning. He still complains of left sided flank pain, also pain lowback. He denies sick contacts, no n/v/d. But does have headache. He denies having cough or sinus congestion, but this morning had some nasal discharge. He is concern that this is simlar to the high fevers he had when he was sick in May where he was treated for PsA bronchitis/sinusitis. 3 wks ago he was seen by urology for work up of elevated PSA.  Past Medical History  Diagnosis Date  . HIV (human immunodeficiency virus infection) 1988  . Asthma   . Chronic anemia     normocytic  . Chronic folliculitis   . Gross hematuria   . Elevated PSA   . H/O pericarditis     2010--  myopercarditis--  resolved  . History of concussion     2012  &  2013  RESIDUAL HA'S --  RESOLVED  . Axillary lymphadenopathy   . History of MRSA infection     infected boil  . History  of gastric ulcer   . CKD (chronic kidney disease), stage II     nephrologist-  dr patel (secondary to hiv vs htn)  . Bilateral renal cysts   . Lytic bone lesion of hip     WORK-UP DONE BY ONCOLOGIST DR HA --  NOT MALIGNANT  . Wears glasses   . GERD (gastroesophageal reflux disease)   . Headache(784.0)     HX SEVERE FRONTAL HA'S  . Prostate cancer 04/25/13    gleason 3+3=6, volume 30 gm  . Ulcer     hx of gastric  . Arthritis     Allergies:  Allergies  Allergen Reactions  . Penicillins Shortness Of Breath and Rash  . Sulfonamide Derivatives Shortness Of Breath and Rash    MEDICATIONS: . Darunavir Ethanolate  800 mg Oral Q supper  . emtricitabine-tenofovir  1 tablet Oral QHS  . fluticasone  1 puff Inhalation BID  . heparin  5,000 Units Subcutaneous Q8H  . imipenem-cilastatin  500 mg Intravenous Q8H  . pantoprazole  40 mg Oral Daily  . raltegravir  800 mg Oral QHS  . ritonavir  100 mg Oral Q supper  . sodium chloride  3 mL Intravenous Q12H  . traZODone  50 mg Oral QHS  History  Substance Use Topics  . Smoking status: Former Smoker    Types: Cigars    Quit date: 05/22/2010  . Smokeless tobacco: Never Used  . Alcohol Use: No     Comment: rare    Family History  Problem Relation Age of Onset  . Stroke Father   . Diabetes Father   . Cancer Father     brain cancer  . Hypertension Sister   . Cancer Maternal Uncle     prostate cancer     Review of Systems  Constitutional: positive for fever, chills, diaphoresis, malaise. butappetite change, fatigue and unexpected weight change.  HENT: Negative for congestion, sore throat, rhinorrhea, sneezing, trouble swallowing and sinus pressure.  Eyes: Negative for photophobia and visual disturbance.  Respiratory: Negative for cough, chest tightness, shortness of breath, wheezing and stridor.  Cardiovascular: Negative for chest pain, palpitations and leg swelling.  Gastrointestinal: Negative for nausea, vomiting, abdominal  pain, diarrhea, constipation, blood in stool, abdominal distention and anal bleeding.  Genitourinary: Negative for dysuria, hematuria, flank pain and difficulty urinating.  Musculoskeletal: positive back pain Skin: Negative for color change, pallor, rash and wound.  Neurological: Negative for dizziness, tremors, weakness and light-headedness.  Hematological: Negative for adenopathy. Does not bruise/bleed easily.  Psychiatric/Behavioral: Negative for behavioral problems, confusion, sleep disturbance, dysphoric mood, decreased concentration and agitation.     OBJECTIVE: Temp:  [98.2 F (36.8 C)-103.3 F (39.6 C)] 98.2 F (36.8 C) (11/30 0537) Pulse Rate:  [79-102] 79 (11/30 0537) Resp:  [17-21] 17 (11/30 0537) BP: (99-136)/(49-82) 115/72 mmHg (11/30 0537) SpO2:  [90 %-97 %] 90 % (11/30 0537) Weight:  [206 lb 8 oz (93.668 kg)] 206 lb 8 oz (93.668 kg) (11/29 1726)  Physical Exam  Constitutional: He is oriented to person, place, and time. He appears well-developed and well-nourished. No distress. Warm to touch HENT:  Mouth/Throat: Oropharynx is clear and moist. No oropharyngeal exudate.  Cardiovascular: Normal rate, regular rhythm and normal heart sounds. Exam reveals no gallop and no friction rub.  No murmur heard.  Pulmonary/Chest: Effort normal and breath sounds normal. No respiratory distress. He has no wheezes.  Abdominal: Soft. Bowel sounds are normal. He exhibits no distension. There is no tenderness.  Lymphadenopathy:  He has no cervical adenopathy.  Back= tender to mild palpation in left flank/paraspinal area as well as along spinous process of lumbar/pelvis area Neurological: He is alert and oriented to person, place, and time. 5/5 motor in upper and lower extremities Skin: Skin is warm and dry. No rash noted. No erythema.  Psychiatric: He has a normal mood and affect. His behavior is normal.    LABS: Results for orders placed during the hospital encounter of 05/31/13 (from  the past 48 hour(s))  CBC WITH DIFFERENTIAL     Status: Abnormal   Collection Time    05/31/13 11:44 AM      Result Value Range   WBC 7.7  4.0 - 10.5 K/uL   RBC 4.03 (*) 4.22 - 5.81 MIL/uL   Hemoglobin 10.1 (*) 13.0 - 17.0 g/dL   HCT 16.1 (*) 09.6 - 04.5 %   MCV 76.9 (*) 78.0 - 100.0 fL   MCH 25.1 (*) 26.0 - 34.0 pg   MCHC 32.6  30.0 - 36.0 g/dL   RDW 40.9 (*) 81.1 - 91.4 %   Platelets 192  150 - 400 K/uL   Neutrophils Relative % 73  43 - 77 %   Neutro Abs 5.6  1.7 - 7.7 K/uL  Lymphocytes Relative 13  12 - 46 %   Lymphs Abs 1.0  0.7 - 4.0 K/uL   Monocytes Relative 10  3 - 12 %   Monocytes Absolute 0.8  0.1 - 1.0 K/uL   Eosinophils Relative 4  0 - 5 %   Eosinophils Absolute 0.3  0.0 - 0.7 K/uL   Basophils Relative 1  0 - 1 %   Basophils Absolute 0.0  0.0 - 0.1 K/uL  COMPREHENSIVE METABOLIC PANEL     Status: Abnormal   Collection Time    05/31/13 11:44 AM      Result Value Range   Sodium 136  135 - 145 mEq/L   Potassium 3.6  3.5 - 5.1 mEq/L   Chloride 99  96 - 112 mEq/L   CO2 27  19 - 32 mEq/L   Glucose, Bld 105 (*) 70 - 99 mg/dL   BUN 9  6 - 23 mg/dL   Creatinine, Ser 1.61  0.50 - 1.35 mg/dL   Calcium 8.8  8.4 - 09.6 mg/dL   Total Protein 7.2  6.0 - 8.3 g/dL   Albumin 3.6  3.5 - 5.2 g/dL   AST 15  0 - 37 U/L   ALT 10  0 - 53 U/L   Alkaline Phosphatase 104  39 - 117 U/L   Total Bilirubin 0.3  0.3 - 1.2 mg/dL   GFR calc non Af Amer 70 (*) >90 mL/min   GFR calc Af Amer 81 (*) >90 mL/min   Comment: (NOTE)     The eGFR has been calculated using the CKD EPI equation.     This calculation has not been validated in all clinical situations.     eGFR's persistently <90 mL/min signify possible Chronic Kidney     Disease.  LIPASE, BLOOD     Status: None   Collection Time    05/31/13 11:44 AM      Result Value Range   Lipase 55  11 - 59 U/L  CG4 I-STAT (LACTIC ACID)     Status: None   Collection Time    05/31/13 11:55 AM      Result Value Range   Lactic Acid, Venous 1.51   0.5 - 2.2 mmol/L  URINALYSIS, ROUTINE W REFLEX MICROSCOPIC     Status: Abnormal   Collection Time    05/31/13 12:04 PM      Result Value Range   Color, Urine YELLOW  YELLOW   APPearance CLEAR  CLEAR   Specific Gravity, Urine 1.012  1.005 - 1.030   pH 6.5  5.0 - 8.0   Glucose, UA NEGATIVE  NEGATIVE mg/dL   Hgb urine dipstick NEGATIVE  NEGATIVE   Bilirubin Urine NEGATIVE  NEGATIVE   Ketones, ur NEGATIVE  NEGATIVE mg/dL   Protein, ur NEGATIVE  NEGATIVE mg/dL   Urobilinogen, UA 0.2  0.0 - 1.0 mg/dL   Nitrite NEGATIVE  NEGATIVE   Leukocytes, UA SMALL (*) NEGATIVE  URINE MICROSCOPIC-ADD ON     Status: None   Collection Time    05/31/13 12:04 PM      Result Value Range   Squamous Epithelial / LPF RARE  RARE   WBC, UA 3-6  <3 WBC/hpf   RBC / HPF 0-2  <3 RBC/hpf   Bacteria, UA RARE  RARE  CULTURE, BLOOD (ROUTINE X 2)     Status: None   Collection Time    05/31/13  3:15 PM      Result Value Range  Specimen Description BLOOD RIGHT ARM     Special Requests BOTTLES DRAWN AEROBIC AND ANAEROBIC 10CC     Culture  Setup Time       Value: 05/31/2013 20:18     Performed at [facility]   Culture       Value:        BLOOD CULTURE RECEIVED NO GROWTH TO DATE CULTURE WILL BE HELD FOR 5 DAYS BEFORE ISSUING A FINAL NEGATIVE REPORT     Performed at [facility]   Report Status PENDING    CULTURE, BLOOD (ROUTINE X 2)     Status: None   Collection Time    05/31/13  3:30 PM      Result Value Range   Specimen Description BLOOD RIGHT HAND     Special Requests BOTTLES DRAWN AEROBIC AND ANAEROBIC 10CC     Culture  Setup Time       Value: 05/31/2013 20:19     Performed at [facility]   Culture       Value:        BLOOD CULTURE RECEIVED NO GROWTH TO DATE CULTURE WILL BE HELD FOR 5 DAYS BEFORE ISSUING A FINAL NEGATIVE REPORT     Performed at [facility]   Report Status PENDING    BASIC METABOLIC PANEL     Status: Abnormal   Collection Time    06/01/13  4:40 AM       Result Value Range   Sodium 138  135 - 145 mEq/L   Potassium 3.5  3.5 - 5.1 mEq/L   Chloride 103  96 - 112 mEq/L   CO2 26  19 - 32 mEq/L   Glucose, Bld 97  70 - 99 mg/dL   BUN 10  6 - 23 mg/dL   Creatinine, Ser 6.96  0.50 - 1.35 mg/dL   Calcium 8.3 (*) 8.4 - 10.5 mg/dL   GFR calc non Af Amer 70 (*) >90 mL/min   GFR calc Af Amer 81 (*) >90 mL/min   Comment: (NOTE)     The eGFR has been calculated using the CKD EPI equation.     This calculation has not been validated in all clinical situations.     eGFR's persistently <90 mL/min signify possible Chronic Kidney     Disease.  CBC     Status: Abnormal   Collection Time    06/01/13  4:40 AM      Result Value Range   WBC 7.7  4.0 - 10.5 K/uL   RBC 3.72 (*) 4.22 - 5.81 MIL/uL   Hemoglobin 9.4 (*) 13.0 - 17.0 g/dL   HCT 29.5 (*) 28.4 - 13.2 %   MCV 76.3 (*) 78.0 - 100.0 fL   MCH 25.3 (*) 26.0 - 34.0 pg   MCHC 33.1  30.0 - 36.0 g/dL   RDW 44.0 (*) 10.2 - 72.5 %   Platelets 172  150 - 400 K/uL  RETICULOCYTES     Status: Abnormal   Collection Time    06/01/13  4:40 AM      Result Value Range   Retic Ct Pct 1.4  0.4 - 3.1 %   RBC. 3.72 (*) 4.22 - 5.81 MIL/uL   Retic Count, Manual 52.1  19.0 - 186.0 K/uL  MAGNESIUM     Status: None   Collection Time    06/01/13  4:40 AM      Result Value Range   Magnesium 1.6  1.5 - 2.5  mg/dL    MICRO: 16/10 blood cx PENDING 11/29 urine cx PENDING IMAGING: Ct Abdomen Pelvis Wo Contrast  05/31/2013   CLINICAL DATA:  Fevers and chills. Left lower quadrant pain. Prostate cancer.  EXAM: CT ABDOMEN AND PELVIS WITHOUT  TECHNIQUE: Multidetector CT imaging of the abdomen and pelvis was performed following the standard protocol without IV contrast.  COMPARISON:  None.  FINDINGS: Small gallstone.  No obvious inflammatory change of the gallbladder.  The unenhanced liver, spleen, pancreas, adrenal glands are within normal limits. Simple cysts in both kidneys. No hydronephrosis. No we urinary calculus.   Bladder is decompressed.  Normal prostate gland.  Normal appendix. Circumferential wall thickening of the rectum is present without inflammatory change. This may simply be due to decompression. Correlate clinically.  No vertebral compression deformity. Tiny sclerotic densities in the right iliac any lytic lesion in the left iliac are stable.  IMPRESSION: Cholelithiasis.  No urinary calculus.   Electronically Signed   By: Maryclare Bean M.D.   On: 05/31/2013 15:33   Dg Chest 2 View  05/31/2013   CLINICAL DATA:  Headache, fever, chills, chest and pelvic pain  EXAM: CHEST  2 VIEW  COMPARISON:  10/31/2012; portions 05/2013  FINDINGS: Grossly unchanged cardiac silhouette and mediastinal contours given marked lordotic projection. Evaluation retrosternal clear space obscured secondary to overlying soft tissues. No focal airspace opacities. No pleural effusion or pneumothorax. No definite evidence of edema. Grossly unchanged bones.  IMPRESSION: No definite acute cardiopulmonary disease on this lordotic examination.   Electronically Signed   By: Simonne Come M.D.   On: 05/31/2013 13:34    Assessment/Plan:  52yo M with HIV that is well controlled presents with acute onset of high fevers. No overt signs of etiology. 1) Fever = - continue on imipenem - await culture results in the next 24-48hr in order to de-escalate/change antibiotics  2)  hiv = continue on home regimen of raltegravir 400mg  BID/truvada daily/darunavir/ritonavir  3) back pain =  On exam appears to be mixed musculoskeletal strain of left psoas/paraspinal region. He is also tender along spinous process. consider getting mri of lumbar/pelvis area. It could account for referred left inguinal pain.   Dr. Ninetta Lights to provide further recs on Monday  Lucca Greggs B. Drue Second MD MPH [facility] [phone]

## 2013-06-01 NOTE — Progress Notes (Signed)
hospitalist in patient room. No new orders
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
## 2013-06-01 NOTE — Progress Notes (Signed)
Physician notified. Patient sister states patient needs medication to control headache and suprapubic pain. New order received.

## 2013-06-02 ENCOUNTER — Inpatient Hospital Stay (HOSPITAL_COMMUNITY): Payer: Medicare Other

## 2013-06-02 DIAGNOSIS — N390 Urinary tract infection, site not specified: Secondary | ICD-10-CM | POA: Diagnosis not present

## 2013-06-02 DIAGNOSIS — R109 Unspecified abdominal pain: Secondary | ICD-10-CM | POA: Diagnosis not present

## 2013-06-02 DIAGNOSIS — N182 Chronic kidney disease, stage 2 (mild): Secondary | ICD-10-CM | POA: Diagnosis not present

## 2013-06-02 DIAGNOSIS — M47817 Spondylosis without myelopathy or radiculopathy, lumbosacral region: Secondary | ICD-10-CM | POA: Diagnosis not present

## 2013-06-02 DIAGNOSIS — A419 Sepsis, unspecified organism: Secondary | ICD-10-CM

## 2013-06-02 DIAGNOSIS — R509 Fever, unspecified: Secondary | ICD-10-CM | POA: Diagnosis not present

## 2013-06-02 DIAGNOSIS — B20 Human immunodeficiency virus [HIV] disease: Principal | ICD-10-CM

## 2013-06-02 DIAGNOSIS — Z21 Asymptomatic human immunodeficiency virus [HIV] infection status: Secondary | ICD-10-CM | POA: Diagnosis not present

## 2013-06-02 LAB — BASIC METABOLIC PANEL
BUN: 7 mg/dL (ref 6–23)
CO2: 25 mEq/L (ref 19–32)
Calcium: 8.6 mg/dL (ref 8.4–10.5)
Chloride: 103 mEq/L (ref 96–112)
Creatinine, Ser: 1.1 mg/dL (ref 0.50–1.35)
GFR calc Af Amer: 89 mL/min — ABNORMAL LOW (ref >90)
GFR calc non Af Amer: 77 mL/min — ABNORMAL LOW (ref >90)
Glucose, Bld: 107 mg/dL — ABNORMAL HIGH (ref 70–99)
Potassium: 3.7 mEq/L (ref 3.5–5.1)
Sodium: 137 mEq/L (ref 135–145)

## 2013-06-02 LAB — CBC WITH DIFFERENTIAL/PLATELET
Basophils Absolute: 0 10*3/uL (ref 0.0–0.1)
Basophils Relative: 0 % (ref 0–1)
Eosinophils Absolute: 0.2 10*3/uL (ref 0.0–0.7)
Eosinophils Relative: 3 % (ref 0–5)
HCT: 28.9 % — ABNORMAL LOW (ref 39.0–52.0)
Hemoglobin: 9.2 g/dL — ABNORMAL LOW (ref 13.0–17.0)
Lymphocytes Relative: 30 % (ref 12–46)
Lymphs Abs: 1.5 10*3/uL (ref 0.7–4.0)
MCH: 24.3 pg — ABNORMAL LOW (ref 26.0–34.0)
MCHC: 31.8 g/dL (ref 30.0–36.0)
MCV: 76.3 fL — ABNORMAL LOW (ref 78.0–100.0)
Monocytes Absolute: 0.6 10*3/uL (ref 0.1–1.0)
Monocytes Relative: 13 % — ABNORMAL HIGH (ref 3–12)
Neutro Abs: 2.6 10*3/uL (ref 1.7–7.7)
Neutrophils Relative %: 54 % (ref 43–77)
Platelets: 169 10*3/uL (ref 150–400)
RBC: 3.79 MIL/uL — ABNORMAL LOW (ref 4.22–5.81)
RDW: 21 % — ABNORMAL HIGH (ref 11.5–15.5)
WBC: 4.8 10*3/uL (ref 4.0–10.5)

## 2013-06-02 LAB — URINE CULTURE: Colony Count: 85000

## 2013-06-02 LAB — MAGNESIUM: Magnesium: 1.8 mg/dL (ref 1.5–2.5)

## 2013-06-02 MED ORDER — OXYCODONE-ACETAMINOPHEN 5-325 MG PO TABS
1.0000 | ORAL_TABLET | ORAL | Status: DC | PRN
  Administered 2013-06-02 – 2013-06-03 (×3): 1 via ORAL
  Filled 2013-06-02 (×4): qty 1

## 2013-06-02 MED ORDER — BECLOMETHASONE DIPROPIONATE 80 MCG/ACT IN AERS
1.0000 | INHALATION_SPRAY | Freq: Two times a day (BID) | RESPIRATORY_TRACT | Status: DC
  Administered 2013-06-02 – 2013-06-04 (×5): 1 via RESPIRATORY_TRACT
  Filled 2013-06-02: qty 8.7

## 2013-06-02 MED ORDER — POLYETHYLENE GLYCOL 3350 17 G PO PACK
17.0000 g | PACK | Freq: Once | ORAL | Status: AC
  Administered 2013-06-02: 17 g via ORAL
  Filled 2013-06-02: qty 1

## 2013-06-02 MED ORDER — DOCUSATE SODIUM 100 MG PO CAPS
100.0000 mg | ORAL_CAPSULE | Freq: Every day | ORAL | Status: DC
  Administered 2013-06-02 – 2013-06-04 (×3): 100 mg via ORAL
  Filled 2013-06-02 (×3): qty 1

## 2013-06-02 MED ORDER — GADOBENATE DIMEGLUMINE 529 MG/ML IV SOLN
20.0000 mL | Freq: Once | INTRAVENOUS | Status: AC
  Administered 2013-06-02: 20 mL via INTRAVENOUS

## 2013-06-02 MED ORDER — TIZANIDINE HCL 2 MG PO TABS
6.0000 mg | ORAL_TABLET | Freq: Three times a day (TID) | ORAL | Status: DC | PRN
  Administered 2013-06-02: 6 mg via ORAL
  Filled 2013-06-02: qty 1

## 2013-06-02 NOTE — Progress Notes (Signed)
INFECTIOUS DISEASE PROGRESS NOTE  ID: Casey Brennan is a 52 y.o. male with  Principal Problem:   Fever of unknown origin Active Problems:   HIV INFECTION   HTN (hypertension)   Postconcussion syndrome   Prostate cancer  Subjective: Continued back pain  Abtx:  Anti-infectives   Start     Dose/Rate Route Frequency Ordered Stop   06/01/13 2200  raltegravir (ISENTRESS) tablet 400 mg     400 mg Oral 2 times daily 06/01/13 1450     06/01/13 1800  imipenem-cilastatin (PRIMAXIN) 500 mg in sodium chloride 0.9 % 100 mL IVPB     500 mg 200 mL/hr over 30 Minutes Intravenous 4 times per day 06/01/13 1451     05/31/13 2200  emtricitabine-tenofovir (TRUVADA) 200-300 MG per tablet 1 tablet     1 tablet Oral Daily at bedtime 05/31/13 1724     05/31/13 2200  raltegravir (ISENTRESS) tablet 800 mg  Status:  Discontinued     800 mg Oral Daily at bedtime 05/31/13 1724 06/01/13 1450   05/31/13 1800  ritonavir (NORVIR) capsule 100 mg     100 mg Oral Daily with supper 05/31/13 1724     05/31/13 1800  Darunavir Ethanolate (PREZISTA) tablet 800 mg     800 mg Oral Daily with supper 05/31/13 1724     05/31/13 1530  imipenem-cilastatin (PRIMAXIN) 500 mg in sodium chloride 0.9 % 100 mL IVPB  Status:  Discontinued     500 mg 200 mL/hr over 30 Minutes Intravenous 3 times per day 05/31/13 1520 06/01/13 1451   05/31/13 1515  ceFEPIme (MAXIPIME) 1 g in dextrose 5 % 50 mL IVPB  Status:  Discontinued     1 g 100 mL/hr over 30 Minutes Intravenous Every 12 hours 05/31/13 1514 05/31/13 1517      Medications:  Scheduled: . beclomethasone  1 puff Inhalation BID  . Darunavir Ethanolate  800 mg Oral Q supper  . emtricitabine-tenofovir  1 tablet Oral QHS  . heparin  5,000 Units Subcutaneous Q8H  . imipenem-cilastatin  500 mg Intravenous Q6H  . pantoprazole  40 mg Oral Daily  . raltegravir  400 mg Oral BID  . ritonavir  100 mg Oral Q supper  . sodium chloride  3 mL Intravenous Q12H  . traZODone  50 mg Oral  QHS    Objective: Vital signs in last 24 hours: Temp:  [98.2 F (36.8 C)-99.3 F (37.4 C)] 99.3 F (37.4 C) (12/01 0455) Pulse Rate:  [68-92] 92 (12/01 0455) Resp:  [18] 18 (12/01 0455) BP: (107-144)/(66-80) 144/79 mmHg (12/01 0455) SpO2:  [92 %-93 %] 93 % (12/01 0942)   General appearance: alert, cooperative and no distress Back: mild flank pain on L Resp: clear to auscultation bilaterally Cardio: regular rate and rhythm GI: normal findings: bowel sounds normal and soft, non-tender Extremities: edema none  Lab Results  Recent Labs  06/01/13 0440 06/02/13 0641  WBC 7.7 4.8  HGB 9.4* 9.2*  HCT 28.4* 28.9*  NA 138 137  K 3.5 3.7  CL 103 103  CO2 26 25  BUN 10 7  CREATININE 1.19 1.10   Liver Panel  Recent Labs  05/31/13 1144  PROT 7.2  ALBUMIN 3.6  AST 15  ALT 10  ALKPHOS 104  BILITOT 0.3   Sedimentation Rate No results found for this basename: ESRSEDRATE,  in the last 72 hours C-Reactive Protein No results found for this basename: CRP,  in the last 72 hours  Microbiology:  Recent Results (from the past 240 hour(s))  URINE CULTURE     Status: None   Collection Time    05/31/13 12:04 PM      Result Value Range Status   Specimen Description URINE, CLEAN CATCH   Final   Special Requests NONE   Final   Culture  Setup Time     Final   Value: 05/31/2013 20:16     Performed at [facility] Count     Final   Value: 85,000 COLONIES/ML     Performed at [facility]   Culture     Final   Value: ESCHERICHIA COLI     Performed at [facility]   Report Status 06/02/2013 FINAL   Final   Organism ID, Bacteria ESCHERICHIA COLI   Final  CULTURE, BLOOD (ROUTINE X 2)     Status: None   Collection Time    05/31/13  3:15 PM      Result Value Range Status   Specimen Description BLOOD RIGHT ARM   Final   Special Requests BOTTLES DRAWN AEROBIC AND ANAEROBIC 10CC   Final   Culture  Setup Time     Final   Value: 05/31/2013 20:18      Performed at [facility]   Culture     Final   Value:        BLOOD CULTURE RECEIVED NO GROWTH TO DATE CULTURE WILL BE HELD FOR 5 DAYS BEFORE ISSUING A FINAL NEGATIVE REPORT     Performed at [facility]   Report Status PENDING   Incomplete  CULTURE, BLOOD (ROUTINE X 2)     Status: None   Collection Time    05/31/13  3:30 PM      Result Value Range Status   Specimen Description BLOOD RIGHT HAND   Final   Special Requests BOTTLES DRAWN AEROBIC AND ANAEROBIC 10CC   Final   Culture  Setup Time     Final   Value: 05/31/2013 20:19     Performed at [facility]   Culture     Final   Value:        BLOOD CULTURE RECEIVED NO GROWTH TO DATE CULTURE WILL BE HELD FOR 5 DAYS BEFORE ISSUING A FINAL NEGATIVE REPORT     Performed at [facility]   Report Status PENDING   Incomplete    Studies/Results: Ct Abdomen Pelvis Wo Contrast  05/31/2013   CLINICAL DATA:  Fevers and chills. Left lower quadrant pain. Prostate cancer.  EXAM: CT ABDOMEN AND PELVIS WITHOUT  TECHNIQUE: Multidetector CT imaging of the abdomen and pelvis was performed following the standard protocol without IV contrast.  COMPARISON:  None.  FINDINGS: Small gallstone.  No obvious inflammatory change of the gallbladder.  The unenhanced liver, spleen, pancreas, adrenal glands are within normal limits. Simple cysts in both kidneys. No hydronephrosis. No we urinary calculus.  Bladder is decompressed.  Normal prostate gland.  Normal appendix. Circumferential wall thickening of the rectum is present without inflammatory change. This may simply be due to decompression. Correlate clinically.  No vertebral compression deformity. Tiny sclerotic densities in the right iliac any lytic lesion in the left iliac are stable.  IMPRESSION: Cholelithiasis.  No urinary calculus.   Electronically Signed   By: Maryclare Bean M.D.   On: 05/31/2013 15:33     Assessment/Plan: HIV+ Fever UTI  Has been afebrile, still has flank pain.    His UA and  CT did not suggest stone.  Await MRI to eval for psoas abscess Could consider change to cipro  Total days of antibiotics: 3 (imipenem)       HIV 1 RNA Quant (copies/mL)  Date Value  05/21/2013 <20   11/18/2012 105*  11/01/2012 25*     CD4 T Cell Abs (/uL)  Date Value  05/21/2013 330*  11/18/2012 500   11/01/2012 400        Johny Sax Infectious Diseases (pager) [phone] [email or web address] 06/02/2013, 2:10 PM  LOS: 2 days

## 2013-06-02 NOTE — Progress Notes (Signed)
INTERNAL MEDICINE ATTENDING NOTE Date: 06/02/2013  Patient name: Casey Brennan  Medical record number: [id number]  Date of birth: 21-Sep-1960  Assessment: 34 M african american HIV well controlled, prostate cancer with PUO. I saw the patient today with the resident team. Feels better today. Exam significant for no chest tenderness/pressure, benign abdominal findings, mild left flank tenderness, mild tenderness around L5 spine, no rash on skin.   Plan  PUO - On Imipenem, temps trending down. ID on board. No leucocytosis, HIV well controlled, urine showed small leukocytes, growing pansensitive EColi 85,000 colonies, MRI awaited for possible evaluation of psoas abscess per ID. Blood Cultures have been negative.   Okay to stop Imipenem and change to ciprofloxacin.   HIV - Continue to treat per ID recs.  Case reviewed with residents; other plan per resident note,   Jeraldine Loots 06/02/2013, 3:49 PM.

## 2013-06-02 NOTE — Progress Notes (Signed)
Called in room by patient's sister. Sister states patient is very warm and feels increased heart rate. Temp 99.3. Heat decreased in room with thermostat. Blanket removed from patient. Antipyretic and pain medicine given.
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
## 2013-06-03 DIAGNOSIS — N182 Chronic kidney disease, stage 2 (mild): Secondary | ICD-10-CM | POA: Diagnosis not present

## 2013-06-03 DIAGNOSIS — N390 Urinary tract infection, site not specified: Secondary | ICD-10-CM

## 2013-06-03 DIAGNOSIS — K6289 Other specified diseases of anus and rectum: Secondary | ICD-10-CM

## 2013-06-03 DIAGNOSIS — A419 Sepsis, unspecified organism: Secondary | ICD-10-CM | POA: Diagnosis not present

## 2013-06-03 DIAGNOSIS — R509 Fever, unspecified: Secondary | ICD-10-CM | POA: Diagnosis not present

## 2013-06-03 DIAGNOSIS — Z21 Asymptomatic human immunodeficiency virus [HIV] infection status: Secondary | ICD-10-CM | POA: Diagnosis not present

## 2013-06-03 LAB — BASIC METABOLIC PANEL
BUN: 9 mg/dL (ref 6–23)
CO2: 25 mEq/L (ref 19–32)
Calcium: 8.4 mg/dL (ref 8.4–10.5)
Chloride: 100 mEq/L (ref 96–112)
Creatinine, Ser: 1.21 mg/dL (ref 0.50–1.35)
GFR calc Af Amer: 79 mL/min — ABNORMAL LOW (ref >90)
GFR calc non Af Amer: 68 mL/min — ABNORMAL LOW (ref >90)
Glucose, Bld: 99 mg/dL (ref 70–99)
Potassium: 4.1 mEq/L (ref 3.5–5.1)
Sodium: 135 mEq/L (ref 135–145)

## 2013-06-03 LAB — CBC
HCT: 29.4 % — ABNORMAL LOW (ref 39.0–52.0)
Hemoglobin: 9.2 g/dL — ABNORMAL LOW (ref 13.0–17.0)
MCH: 24.3 pg — ABNORMAL LOW (ref 26.0–34.0)
MCHC: 31.3 g/dL (ref 30.0–36.0)
MCV: 77.6 fL — ABNORMAL LOW (ref 78.0–100.0)
Platelets: 165 10*3/uL (ref 150–400)
RBC: 3.79 MIL/uL — ABNORMAL LOW (ref 4.22–5.81)
RDW: 21 % — ABNORMAL HIGH (ref 11.5–15.5)
WBC: 3.4 10*3/uL — ABNORMAL LOW (ref 4.0–10.5)

## 2013-06-03 MED ORDER — CIPROFLOXACIN HCL 500 MG PO TABS
500.0000 mg | ORAL_TABLET | Freq: Two times a day (BID) | ORAL | Status: DC
  Administered 2013-06-03 – 2013-06-04 (×3): 500 mg via ORAL
  Filled 2013-06-03 (×5): qty 1

## 2013-06-03 MED ORDER — ONDANSETRON HCL 4 MG PO TABS
4.0000 mg | ORAL_TABLET | Freq: Once | ORAL | Status: AC
  Administered 2013-06-03: 4 mg via ORAL
  Filled 2013-06-03: qty 1

## 2013-06-03 MED ORDER — INFLUENZA VAC SPLIT QUAD 0.5 ML IM SUSP
0.5000 mL | INTRAMUSCULAR | Status: AC
  Administered 2013-06-04: 0.5 mL via INTRAMUSCULAR
  Filled 2013-06-03 (×2): qty 0.5

## 2013-06-03 NOTE — Progress Notes (Signed)
    Date: 06/03/2013      Patient name: Casey Brennan  Medical record number: [id number]  Date of birth: 05/14/1961  Assessment: 52 year old african [place] male with HIV, with fever and sepsis mostly likely secondary to urinary tract infection.    Exam today: Feels better. Had vomiting in the morning. Sister by his side. No fever in the past 24 hours - temps mostly ranging in 99s. Exam unremarkable.    Plan  Urinary Tract Infection - MRI lumbar spine and pelvis did not reveal any focus of infection. Change Imipenem to Ciprofloxacin. We will continue to follow ID recs. If the patient keeps recovering, and responds well to Cipro, with no overnight events, we will discharge him in the morning with follow up with PCP. We will do this in consultation with ID.  Case reviewed with IM resident team; rest of the plan per resident note,   Dalphine Handing, [facility] 06/03/2013, 1:44 PM.
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
## 2013-06-03 NOTE — Progress Notes (Signed)
INFECTIOUS DISEASE PROGRESS NOTE  ID: Casey Brennan is a 52 y.o. male with  Principal Problem:   Fever of unknown origin Active Problems:   HIV INFECTION   HTN (hypertension)   Postconcussion syndrome   Prostate cancer  Subjective: Feels better after episode of emesis.   Abtx:  Anti-infectives   Start     Dose/Rate Route Frequency Ordered Stop   06/03/13 1000  ciprofloxacin (CIPRO) tablet 500 mg     500 mg Oral 2 times daily 06/03/13 0824 06/10/13 0759   06/01/13 2200  raltegravir (ISENTRESS) tablet 400 mg     400 mg Oral 2 times daily 06/01/13 1450     06/01/13 1800  imipenem-cilastatin (PRIMAXIN) 500 mg in sodium chloride 0.9 % 100 mL IVPB  Status:  Discontinued     500 mg 200 mL/hr over 30 Minutes Intravenous 4 times per day 06/01/13 1451 06/03/13 0824   05/31/13 2200  emtricitabine-tenofovir (TRUVADA) 200-300 MG per tablet 1 tablet     1 tablet Oral Daily at bedtime 05/31/13 1724     05/31/13 2200  raltegravir (ISENTRESS) tablet 800 mg  Status:  Discontinued     800 mg Oral Daily at bedtime 05/31/13 1724 06/01/13 1450   05/31/13 1800  ritonavir (NORVIR) capsule 100 mg     100 mg Oral Daily with supper 05/31/13 1724     05/31/13 1800  Darunavir Ethanolate (PREZISTA) tablet 800 mg     800 mg Oral Daily with supper 05/31/13 1724     05/31/13 1530  imipenem-cilastatin (PRIMAXIN) 500 mg in sodium chloride 0.9 % 100 mL IVPB  Status:  Discontinued     500 mg 200 mL/hr over 30 Minutes Intravenous 3 times per day 05/31/13 1520 06/01/13 1451   05/31/13 1515  ceFEPIme (MAXIPIME) 1 g in dextrose 5 % 50 mL IVPB  Status:  Discontinued     1 g 100 mL/hr over 30 Minutes Intravenous Every 12 hours 05/31/13 1514 05/31/13 1517      Medications:  Scheduled: . beclomethasone  1 puff Inhalation BID  . ciprofloxacin  500 mg Oral BID  . Darunavir Ethanolate  800 mg Oral Q supper  . docusate sodium  100 mg Oral Daily  . emtricitabine-tenofovir  1 tablet Oral QHS  . heparin  5,000  Units Subcutaneous Q8H  . pantoprazole  40 mg Oral Daily  . raltegravir  400 mg Oral BID  . ritonavir  100 mg Oral Q supper  . sodium chloride  3 mL Intravenous Q12H  . traZODone  50 mg Oral QHS    Objective: Vital signs in last 24 hours: Temp:  [98.3 F (36.8 C)-99.3 F (37.4 C)] 99 F (37.2 C) (12/02 0941) Pulse Rate:  [80-93] 86 (12/02 0941) Resp:  [18] 18 (12/02 0538) BP: (94-141)/(52-87) 115/72 mmHg (12/02 0941) SpO2:  [91 %-93 %] 93 % (12/02 1107)   General appearance: alert, cooperative and no distress Resp: clear to auscultation bilaterally Cardio: regular rate and rhythm GI: normal findings: bowel sounds normal and soft, non-tender  Lab Results  Recent Labs  06/02/13 0641 06/03/13 0510  WBC 4.8 3.4*  HGB 9.2* 9.2*  HCT 28.9* 29.4*  NA 137 135  K 3.7 4.1  CL 103 100  CO2 25 25  BUN 7 9  CREATININE 1.10 1.21   Liver Panel No results found for this basename: PROT, ALBUMIN, AST, ALT, ALKPHOS, BILITOT, BILIDIR, IBILI,  in the last 72 hours Sedimentation Rate No results found for  this basename: ESRSEDRATE,  in the last 72 hours C-Reactive Protein No results found for this basename: CRP,  in the last 72 hours  Microbiology: Recent Results (from the past 240 hour(s))  URINE CULTURE     Status: None   Collection Time    05/31/13 12:04 PM      Result Value Range Status   Specimen Description URINE, CLEAN CATCH   Final   Special Requests NONE   Final   Culture  Setup Time     Final   Value: 05/31/2013 20:16     Performed at [facility] Count     Final   Value: 85,000 COLONIES/ML     Performed at [facility]   Culture     Final   Value: ESCHERICHIA COLI     Performed at [facility]   Report Status 06/02/2013 FINAL   Final   Organism ID, Bacteria ESCHERICHIA COLI   Final  CULTURE, BLOOD (ROUTINE X 2)     Status: None   Collection Time    05/31/13  3:15 PM      Result Value Range Status   Specimen Description BLOOD  RIGHT ARM   Final   Special Requests BOTTLES DRAWN AEROBIC AND ANAEROBIC 10CC   Final   Culture  Setup Time     Final   Value: 05/31/2013 20:18     Performed at [facility]   Culture     Final   Value:        BLOOD CULTURE RECEIVED NO GROWTH TO DATE CULTURE WILL BE HELD FOR 5 DAYS BEFORE ISSUING A FINAL NEGATIVE REPORT     Performed at [facility]   Report Status PENDING   Incomplete  CULTURE, BLOOD (ROUTINE X 2)     Status: None   Collection Time    05/31/13  3:30 PM      Result Value Range Status   Specimen Description BLOOD RIGHT HAND   Final   Special Requests BOTTLES DRAWN AEROBIC AND ANAEROBIC 10CC   Final   Culture  Setup Time     Final   Value: 05/31/2013 20:19     Performed at [facility]   Culture     Final   Value:        BLOOD CULTURE RECEIVED NO GROWTH TO DATE CULTURE WILL BE HELD FOR 5 DAYS BEFORE ISSUING A FINAL NEGATIVE REPORT     Performed at [facility]   Report Status PENDING   Incomplete    Studies/Results: Mr Lumbar Spine W Wo Contrast  06/02/2013   CLINICAL DATA:  Evaluate for psoas abscess. Low back pain. HIV infection.  EXAM: MRI LUMBAR SPINE WITHOUT AND WITH CONTRAST  TECHNIQUE: Multiplanar and multiecho pulse sequences of the lumbar spine were obtained without and with intravenous contrast.  CONTRAST:  20mL MULTIHANCE GADOBENATE DIMEGLUMINE 529 MG/ML IV SOLN  COMPARISON:  CT abdomen and pelvis 05/31/2013. MRI of the pelvis reported separately.  FINDINGS: Anatomic alignment. Slight disc space narrowing L3-L4. No focal areas of marrow signal abnormality. Minor disc pathology at L3-4 otherwise unremarkable disc spaces. Normal conus and distal thoracic vertebral bodies Axial images revealed mild stenosis at the L3-4 level which is multifactorial ; mild bilateral neural foraminal narrowing at this level is also appreciated. No discal hyperintensity to suggest discitis. No endplate erosion or bone marrow edema to suggest osteomyelitis.  Within limits of evaluation, normal appearing SI joints.  Post  infusion images demonstrate no abnormal enhancement epidural space, intraspinal contents, vertebral bodies, disc spaces, or surrounding soft tissues. Careful examination of the bilateral psoas muscles reveals no psoas abscess or paravertebral inflammatory process. Bilateral cystic kidneys could be HIV related.  IMPRESSION: Mild degenerative change at L3-L4 as described.  No evidence for discitis, osteomyelitis, epidural or subdural abscess or empyema, or psoas abscess.   Electronically Signed   By: Davonna Belling M.D.   On: 06/02/2013 18:19   Mr Pelvis W Wo Contrast  06/03/2013   CLINICAL DATA:  Fever and flank pain. HIV positive. Evaluate for possible psoas abscess.  EXAM: MRI PELVIS WITHOUT AND WITH CONTRAST  TECHNIQUE: Multiplanar multisequence MR imaging of the pelvis was performed both before and after administration of intravenous contrast.  CONTRAST:  20 ml MultiHance.  COMPARISON:  Lumbar MRI 06/02/2013.  Pelvic CT 05/31/2013.  FINDINGS: The bony pelvis and both sacroiliac joints appear normal without erosive change or abnormal enhancement. Both femoral heads appear normal. There is no hip joint effusion or abnormal synovial enhancement.  The gluteus, psoas and proximal thigh musculature appears normal without evidence of abscess. The corresponding tendons are intact. There are no periarticular fluid collections.  Again demonstrated is circumferential rectal wall thickening with low level enhancement, similar to prior CT. There is mild stranding in the perirectal fat without focal fluid collection or adenopathy. The prostate gland demonstrates homogeneous enhancement. No bladder abnormality is identified. No ureteral dilatation is evident.  IMPRESSION: 1. No evidence of psoas abscess. 2. No osseous or articular abnormalities are identified within the pelvis. 3. Circumferential rectal wall thickening suspicious for proctitis, similar to prior CT.  Correlate clinically.   Electronically Signed   By: Roxy Horseman M.D.   On: 06/03/2013 08:10     Assessment/Plan: Fever UTI Proctitis HIV+  Total days of antibiotics: 4 (Imipenem --> cipro) DRVr/TRV/ISN      Needs flu shot Pneumonia vax due 03-2014. Agree with colonoscopy as outpt.  My great appreciation to [facility] for caring for this pt.     Johny Sax Infectious Diseases (pager) [phone] [email or web address] 06/03/2013, 3:26 PM  LOS: 3 days

## 2013-06-04 ENCOUNTER — Telehealth: Payer: Self-pay | Admitting: Internal Medicine

## 2013-06-04 ENCOUNTER — Telehealth: Payer: Self-pay | Admitting: Infectious Disease

## 2013-06-04 ENCOUNTER — Ambulatory Visit: Payer: Medicaid Other | Admitting: Infectious Diseases

## 2013-06-04 DIAGNOSIS — A419 Sepsis, unspecified organism: Secondary | ICD-10-CM | POA: Diagnosis not present

## 2013-06-04 DIAGNOSIS — N182 Chronic kidney disease, stage 2 (mild): Secondary | ICD-10-CM | POA: Diagnosis not present

## 2013-06-04 DIAGNOSIS — R509 Fever, unspecified: Secondary | ICD-10-CM | POA: Diagnosis not present

## 2013-06-04 MED ORDER — CIPROFLOXACIN HCL 500 MG PO TABS: 500.0000 mg | ORAL_TABLET | Freq: Two times a day (BID) | ORAL | Status: DC

## 2013-06-04 MED ORDER — SENNA 8.6 MG PO TABS: 1.0000 | ORAL_TABLET | Freq: Every day | ORAL | Status: DC

## 2013-06-04 MED ORDER — SENNA 8.6 MG PO TABS
1.0000 | ORAL_TABLET | Freq: Every day | ORAL | Status: DC
  Administered 2013-06-04: 8.6 mg via ORAL
  Filled 2013-06-04: qty 1

## 2013-06-04 MED ORDER — OXYCODONE-ACETAMINOPHEN 5-325 MG PO TABS: 1.0000 | ORAL_TABLET | ORAL | Status: DC | PRN

## 2013-06-04 MED ORDER — MINERAL OIL RE ENEM
1.0000 | ENEMA | RECTAL | Status: DC | PRN
  Filled 2013-06-04: qty 1

## 2013-06-04 MED ORDER — ONDANSETRON 4 MG PO TBDP
4.0000 mg | ORAL_TABLET | Freq: Once | ORAL | Status: AC
  Administered 2013-06-04: 4 mg via ORAL
  Filled 2013-06-04 (×2): qty 1

## 2013-06-04 MED ORDER — BISACODYL 10 MG RE SUPP
10.0000 mg | Freq: Once | RECTAL | Status: AC
  Administered 2013-06-04: 10 mg via RECTAL
  Filled 2013-06-04: qty 1

## 2013-06-04 MED ORDER — DSS 100 MG PO CAPS: 100.0000 mg | ORAL_CAPSULE | Freq: Every day | ORAL | Status: DC

## 2013-06-04 NOTE — Progress Notes (Signed)
    Date: 06/04/2013      Patient name: Casey Brennan  Medical record number: [id number]  Date of birth: [birth year]  Assessment: 35 M with sepsis of urological origin. Clinically improving.  Exam today: Afebrile, vomiting in the morning, constipation for the past 3 days. Exam significant for no abdominal pain.  Plan: Continue current treatment regimen, with zofran for vomiting (likely antibiotic related stomach upset). If continues to do well with Zofran ok to discharge. We will escalate the laxative regimen to help moves bowels.  Case reviewed with IM resident team; rest of the plan per resident note,   Dalphine Handing, [facility] 06/04/2013, 12:13 PM.
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
## 2013-06-04 NOTE — Care Management Note (Signed)
    Page 1 of 1   06/04/2013     10:26:53 AM   CARE MANAGEMENT NOTE 06/04/2013  Patient:  Casey Brennan, Casey Brennan   Account Number:  [id number]  Date Initiated:  06/04/2013  Documentation initiated by:  Letha Cape  Subjective/Objective Assessment:   dx fever  admit- lives with sister.     Action/Plan:   Anticipated DC Date:  06/04/2013   Anticipated DC Plan:  HOME/SELF CARE         Choice offered to / List presented to:             Status of service:  Completed, signed off Medicare Important Message given?   (If response is "NO", the following Medicare IM given date fields will be blank) Date Medicare IM given:   Date Additional Medicare IM given:    Discharge Disposition:  HOME/SELF CARE  Per UR Regulation:  Reviewed for med. necessity/level of care/duration of stay  If discussed at Long Length of Stay Meetings, dates discussed:    Comments:  06/04/13 10:25 Letha Cape RN, BSN [phone] patient lives with sister, patient is for dc today , No NCM referral, no needs anticipated.

## 2013-06-04 NOTE — Telephone Encounter (Addendum)
I was called at 11:20  PM by patient's sister, who states that the patient has fever again at 9:30 PM with temperature 100.3. The repeated temperature was 100.2. Patient was recently hospitalized because of fever and possible urinary tract infection. Patient was initially treated with IV antibiotics, and was switched to ciprofloxacin by mouth at discharge on 05/05/13. Patient was afebrile at discharge. He has been doing well after he went home per his sister. This afternoon, the patient started having fever and chills. He looks like little sluggished per his sister, but is oriented X 3. He has mild left flank pain which he had in the hospital. He also has minimal headache. He does not have cough, chest pain, nausea, vomiting or diarrhea. Patient is oriented x3. He has good oral intake. He is currently taking ciprofloxacin as prescribed at discharge.   A&P: - Overall patient's symptoms have not changed significantly except for fever.  It is most likely due to urinary tract infection, though other causes for FUO can not be completely ruled out.  - I discussed with patient's sister, and suggested her sister to observe patient closely. if his symptoms do not get worse, he can await untiil tomorrow morning to come to the clinic for evaluation. If his symptoms get worse, or if he develops new symptoms, such as confusion, high fever or worsening pain, then patient needs to come to the emergency room immediately. His sister verbally understood and agreed to so. - continue ciprofloxacin as prescribed at discharge.   Lorretta Harp, MD PGY3, Internal Medicine Teaching Service Pager: [phone]

## 2013-06-04 NOTE — Progress Notes (Signed)
Patient discharge teaching given, including activity, diet, follow-up appoints, and medications. Patient verbalized understanding of all discharge instructions. IV access was d/c'd. Vitals are stable. Skin is intact except as charted in most recent assessments. Pt to be escorted out by NT, to be driven home by family. 

## 2013-06-04 NOTE — Telephone Encounter (Signed)
I received phone call re Mr Casey Brennan by his wife and caretaker stating that his temperature was up again.   I asked why Mr Casey Brennan was incapable of talking o me directly and she stated that she was his caregiver.  I told her that I could not escalate his antibiotics or complete a workup of FUO over the phone adn that his options were an appt tomorrow in clinic vs a return trip to the ED. At this point she abruptly hung up on me.  I would note having a similar call at 330am this Spring from same caretaker. PT was just dc from hospital for FUO workup  Are there any slots in clinic tomorrow or next day for this pt? I dont think I know this pt very well but both times I have had VERY odd interaction with the caretaker who  Calls frequentl at strange times and with unclear ?s

## 2013-06-06 LAB — CULTURE, BLOOD (ROUTINE X 2)
Culture: NO GROWTH
Culture: NO GROWTH

## 2013-06-10 NOTE — Discharge Summary (Signed)
   Date: 06/10/2013    Patient name: Casey Brennan  MRN: [id number]  Date of birth: [birth year]  I evaluated the patient on the day of discharge and discussed the discharge plan with my resident team. I agree with the discharge documentation and disposition.     Aletta Edouard 06/10/2013, 8:37 AM

## 2013-06-12 DIAGNOSIS — R310 Gross hematuria: Secondary | ICD-10-CM | POA: Diagnosis not present

## 2013-06-12 DIAGNOSIS — C61 Malignant neoplasm of prostate: Secondary | ICD-10-CM | POA: Diagnosis not present

## 2013-06-12 DIAGNOSIS — N281 Cyst of kidney, acquired: Secondary | ICD-10-CM | POA: Diagnosis not present

## 2013-06-13 ENCOUNTER — Ambulatory Visit (INDEPENDENT_AMBULATORY_CARE_PROVIDER_SITE_OTHER): Payer: Medicare Other | Admitting: Internal Medicine

## 2013-06-13 ENCOUNTER — Telehealth: Payer: Self-pay | Admitting: Internal Medicine

## 2013-06-13 ENCOUNTER — Encounter: Payer: Self-pay | Admitting: Internal Medicine

## 2013-06-13 VITALS — BP 121/78 | HR 75 | Temp 97.9°F | Wt 211.7 lb

## 2013-06-13 DIAGNOSIS — A419 Sepsis, unspecified organism: Secondary | ICD-10-CM

## 2013-06-13 DIAGNOSIS — D509 Iron deficiency anemia, unspecified: Secondary | ICD-10-CM | POA: Insufficient documentation

## 2013-06-13 DIAGNOSIS — N390 Urinary tract infection, site not specified: Secondary | ICD-10-CM | POA: Diagnosis not present

## 2013-06-13 DIAGNOSIS — Z09 Encounter for follow-up examination after completed treatment for conditions other than malignant neoplasm: Secondary | ICD-10-CM | POA: Diagnosis not present

## 2013-06-13 DIAGNOSIS — D649 Anemia, unspecified: Secondary | ICD-10-CM

## 2013-06-13 MED ORDER — BECLOMETHASONE DIPROPIONATE 80 MCG/ACT NA AERS: 80.0000 ug | INHALATION_SPRAY | Freq: Two times a day (BID) | NASAL | Status: DC

## 2013-06-13 NOTE — Progress Notes (Signed)
Patient: Casey Brennan   MRN: [id number]  DOB: 1960-10-16  PCP: Lorretta Harp, MD   Subjective:    CC: HFU   HPI: [name] is a 52 y.o. male with a PMHx of HIV (CD4 330, VL < 20 on Nov 2014), recent diagnosis of prostate cancer, HTN and asthma, who presented to clinic today for the hospital followup.  # Sepsis 2/2 UTI  Patient was hospitalized for sepsis secondary to UTI and possible proctitis on 05/31/2013.  He was treated with broad-spectrum antibiotics and discharged with Cipro on 06/04/2013.  He noted a low-grade temperature of 100.2 after discharge on 06/04/2013, and he was instructed to continue Cipro as prescribed upon discharge.  He states that he feels much better.  Still has a mild left flank pain 1-2/10, with which he attributes to his chronic back pain.  He reports less urinary frequency.  Denies dysuria or urinary urgency.  Denies fever, chills, shortness breath, chest pain, nausea, vomiting, abdominal pain, weakness and fatigue.  He is here for followup.   Review of Systems: Per HPI.   Current Outpatient Medications: Current Outpatient Prescriptions  Medication Sig Dispense Refill  . Beclomethasone Dipropionate (QNASL) 80 MCG/ACT AERS Place 80 mcg into the nose 2 (two) times daily.  8.7 g  1  . Darunavir Ethanolate (PREZISTA) 800 MG tablet Take 800 mg by mouth at bedtime. Take with Norvir and Truvada.      . docusate sodium 100 MG CAPS Take 100 mg by mouth daily.  30 capsule  3  . emtricitabine-tenofovir (TRUVADA) 200-300 MG per tablet Take 1 tablet by mouth at bedtime. Take with Prezista and Norvir.      . folic acid (FOLVITE) 1 MG tablet Take 1 mg by mouth daily as needed (For Folic Acid Insufficiency.).       [name] gabapentin (NEURONTIN) 600 MG tablet Take 600 mg by mouth 4 (four) times daily as needed (For pain.).       [name] ipratropium (ATROVENT) 0.02 % nebulizer solution Take 500 mcg by nebulization 3 (three) times daily as needed for wheezing.      .  levalbuterol (XOPENEX HFA) 45 MCG/ACT inhaler Inhale 1-2 puffs into the lungs every 4 (four) hours as needed for wheezing.  1 Inhaler  12  . levalbuterol (XOPENEX) 0.63 MG/3ML nebulizer solution Take 1 ampule by nebulization 3 (three) times daily as needed for wheezing.      [name] loratadine (CLARITIN) 10 MG tablet Take 10 mg by mouth daily as needed for allergies.       . Multiple Vitamin (MULTIVITAMIN WITH MINERALS) TABS Take 1 tablet by mouth at bedtime.       [name] omeprazole (PRILOSEC) 40 MG capsule Take 40 mg by mouth at bedtime.       . ondansetron (ZOFRAN) 4 MG tablet Take 1 tablet (4 mg total) by mouth every 8 (eight) hours as needed for nausea.  30 tablet  6  . oxyCODONE-acetaminophen (PERCOCET/ROXICET) 5-325 MG per tablet Take 1-2 tablets by mouth every 4 (four) hours as needed for severe pain.  30 tablet  0  . QVAR 80 MCG/ACT inhaler Inhale 1 puff into the lungs daily as needed (wheezing).       . raltegravir (ISENTRESS) 400 MG tablet Take 400 mg by mouth 2 (two) times daily.      . ritonavir (NORVIR) 100 MG capsule Take 100 mg by mouth at bedtime. Take with Prezista and Truvada.      [name]  senna (SENOKOT) 8.6 MG TABS tablet Take 1 tablet (8.6 mg total) by mouth daily.  120 each  0  . tizanidine (ZANAFLEX) 6 MG capsule Take 1 capsule (6 mg total) by mouth 3 (three) times daily as needed for muscle spasms.  90 capsule  3  . traZODone (DESYREL) 50 MG tablet Take 50 mg by mouth at bedtime.      [name] zolpidem (AMBIEN CR) 12.5 MG CR tablet Take 1 tablet (12.5 mg total) by mouth at bedtime as needed. For sleep  30 tablet  0  . [DISCONTINUED] famotidine (PEPCID AC) 10 MG tablet One po bid x 3 days and then prn upper abdominal pain  20 tablet  0   No current facility-administered medications for this visit.    Allergies: Allergies  Allergen Reactions  . Penicillins Shortness Of Breath and Rash  . Sulfonamide Derivatives Shortness Of Breath and Rash  . Morphine And Related     Itching, shortness of  breath, chest pain    Past Medical History  Diagnosis Date  . HIV (human immunodeficiency virus infection) 1988  . Asthma   . Chronic anemia     normocytic  . Chronic folliculitis   . Gross hematuria   . Elevated PSA   . H/O pericarditis     2010--  myopercarditis--  resolved  . History of concussion     2012  &  2013  RESIDUAL HA'S --  RESOLVED  . Axillary lymphadenopathy   . History of MRSA infection     infected boil  . History of gastric ulcer   . CKD (chronic kidney disease), stage II     nephrologist-  dr patel (secondary to hiv vs htn)  . Bilateral renal cysts   . Lytic bone lesion of hip     WORK-UP DONE BY ONCOLOGIST DR HA --  NOT MALIGNANT  . Wears glasses   . GERD (gastroesophageal reflux disease)   . Headache(784.0)     HX SEVERE FRONTAL HA'S  . Prostate cancer 04/25/13    gleason 3+3=6, volume 30 gm  . Ulcer     hx of gastric  . Arthritis     Objective:    Physical Exam: Filed Vitals:   06/13/13 1117  BP: 121/78  Pulse: 75  Temp: 97.9 F (36.6 C)  TempSrc: Oral  Weight: 211 lb 11.2 oz (96.026 kg)  SpO2: 97%     General: Vital signs reviewed and noted. Well-developed, well-nourished, in no acute distress; alert, appropriate and cooperative throughout examination.  Head: Normocephalic, atraumatic.  Lungs:  Normal respiratory effort. Clear to auscultation BL without crackles or wheezes.  Heart: RRR. S1 and S2 normal without gallop, rubs, murmur.  Abdomen:  BS normoactive. Soft, Nondistended, non-tender.  No masses or organomegaly. Negative CTA tenderness  Extremities: No pretibial edema.   Assessment/ Plan:    # Colonoscopy  Of note, he had a colonoscopy in 2013.  And he told me that his GI doctor will repeat his colonoscopy in 2015.  He will contact his GI doctor for scheduling.  # Vitamin B 12 shots. He states that he needs B12 shot today.  However he doesn't have diagnosis indicating that he needs monthly vitamin B 12 injections,   and  vitamin B12 is not on his medication list.  He states that he will talk to his infectious disease doctor.

## 2013-06-13 NOTE — Patient Instructions (Addendum)
1. Please ask Dr. Ninetta Lights office about your Vitamin B 12 shot.  2. Please contact your GI doctor for repeat Colonoscopy. 3. Follow up with Dr. Clyde Lundborg in one month. 4. Please call the clinic or go to ED if you have fever, chills, pain with urination and urinary frequency and urgency.  5. Follow up with your Surgeon Dr Berneice Heinrich as instructed.

## 2013-06-13 NOTE — Assessment & Plan Note (Addendum)
Assessment: He is here for hospital followup after he was treated for sepsis secondary to UTI and proctitis.  He is compliant with his antibiotics Cipro.  States that he feels much better.  Denies dysuria or urinary urgency.  Reports less urinary frequency.   Of note, he was diagnosed with prostate cancer this year and is in process of evaluation.  He reports that his surgeon Dr. Berneice Heinrich will [facility] for second opinion and possible repeat biopsy. He may have surgery in [place], 2015.  Plan: -Patient is instructed to continue and finish the course of antibiotics. - Followup in 1-3 month.  - Patient is instructed to call the clinic or go to ED if he has fever, chills, pain with urination and urinary frequency and urgency.

## 2013-06-13 NOTE — Progress Notes (Signed)
Patient ID: Casey Brennan, male   DOB: 1961-06-19, 52 y.o.   MRN: [id number]  Case discussed with Dr. Dierdre Searles soon after the resident saw the patient.  We reviewed the resident's history and exam and pertinent patient test results.  I agree with the assessment, diagnosis, and plan of care documented in the resident's note.

## 2013-06-13 NOTE — Telephone Encounter (Signed)
I received a message from Dr. Dierdre Searles, stating that patient needs pre-authorization for a new medication,  Metanx. I called patient's home at 8:00 PM, but no one picked up the phone. I left a message telling patient that I will call him again later.   Lorretta Harp, MD PGY3, Internal Medicine Teaching Service Pager: [phone]

## 2013-06-16 ENCOUNTER — Other Ambulatory Visit: Payer: Self-pay | Admitting: *Deleted

## 2013-06-16 ENCOUNTER — Telehealth: Payer: Self-pay | Admitting: Internal Medicine

## 2013-06-16 DIAGNOSIS — R109 Unspecified abdominal pain: Secondary | ICD-10-CM

## 2013-06-16 MED ORDER — OXYCODONE-ACETAMINOPHEN 5-325 MG PO TABS: 1.0000 | ORAL_TABLET | Freq: Three times a day (TID) | ORAL | Status: DC | PRN

## 2013-06-16 NOTE — Telephone Encounter (Signed)
I called patient again regarding the Metanx prescription. Metanx contains three major components, including Vb12, Vb1 and folate.  Currently patient is taking folic acid. His Vitamin B12 level was normal on 06/01/13. He does no drink alcohol. There is no indication for vitamin B1. Therefore I do not think that he needs this medication. I explained to patient and his sister who is care giver. They understood that patient does no need this medication. Will not prescribe.   Lorretta Harp, MD  PGY3, Internal Medicine Teaching Service  Pager: [phone]

## 2013-06-16 NOTE — Telephone Encounter (Signed)
Pt did not get a refill of pain meds at office visit. He wants refill - last filled 12/3 for # 30  Pt # [phone]

## 2013-06-17 NOTE — Telephone Encounter (Signed)
Rx ready - pt was called. 

## 2013-06-18 ENCOUNTER — Telehealth: Payer: Self-pay | Admitting: *Deleted

## 2013-06-18 NOTE — Telephone Encounter (Signed)
Received call from pt's sister "April".  She just picked up medical alert certificate that was completed by pcp,. The purpose of this form is for [employer] to take pt's medical problems into consideration prior to disconnecting power for non-payment or to see if satisfactory payment arrangements can be made.  Pt's sister would like form to be redone and needs it asap. She would also like for you take htn and mood disorder off the form and felt it was unnecessary for the the discharge date to be on the form, but to state that pt was recently discharged from hospital. I have printed off another copy from [facility] website and placed in your box for review.  Please contact pt's sister @ [phone] if you need additional information or have questions.Casey Spittle Cassady12/17/20143:49 PM

## 2013-06-19 ENCOUNTER — Other Ambulatory Visit: Payer: Self-pay | Admitting: *Deleted

## 2013-06-19 DIAGNOSIS — G4700 Insomnia, unspecified: Secondary | ICD-10-CM

## 2013-06-19 MED ORDER — ZOLPIDEM TARTRATE ER 12.5 MG PO TBCR: 12.5000 mg | EXTENDED_RELEASE_TABLET | Freq: Every evening | ORAL | Status: DC | PRN

## 2013-06-20 NOTE — Telephone Encounter (Signed)
Rx faxed in.

## 2013-06-23 ENCOUNTER — Other Ambulatory Visit: Payer: Self-pay | Admitting: Licensed Clinical Social Worker

## 2013-06-23 DIAGNOSIS — B20 Human immunodeficiency virus [HIV] disease: Secondary | ICD-10-CM

## 2013-06-23 MED ORDER — ONDANSETRON HCL 4 MG PO TABS: 4.0000 mg | ORAL_TABLET | Freq: Three times a day (TID) | ORAL | Status: DC | PRN

## 2013-06-24 DIAGNOSIS — C61 Malignant neoplasm of prostate: Secondary | ICD-10-CM | POA: Diagnosis not present

## 2013-07-10 DIAGNOSIS — C61 Malignant neoplasm of prostate: Secondary | ICD-10-CM | POA: Diagnosis not present

## 2013-07-10 DIAGNOSIS — R310 Gross hematuria: Secondary | ICD-10-CM | POA: Diagnosis not present

## 2013-07-10 DIAGNOSIS — N281 Cyst of kidney, acquired: Secondary | ICD-10-CM | POA: Diagnosis not present

## 2013-07-15 ENCOUNTER — Telehealth: Payer: Self-pay | Admitting: *Deleted

## 2013-07-15 DIAGNOSIS — R972 Elevated prostate specific antigen [PSA]: Secondary | ICD-10-CM | POA: Insufficient documentation

## 2013-07-15 DIAGNOSIS — Z21 Asymptomatic human immunodeficiency virus [HIV] infection status: Secondary | ICD-10-CM | POA: Diagnosis not present

## 2013-07-15 DIAGNOSIS — N390 Urinary tract infection, site not specified: Secondary | ICD-10-CM | POA: Diagnosis not present

## 2013-07-15 DIAGNOSIS — C61 Malignant neoplasm of prostate: Secondary | ICD-10-CM | POA: Diagnosis not present

## 2013-07-15 DIAGNOSIS — R319 Hematuria, unspecified: Secondary | ICD-10-CM | POA: Diagnosis not present

## 2013-07-15 NOTE — Telephone Encounter (Signed)
Patient sister April called and asked if we could help with medications. She advised the patient has been in the hospital and is a little short on the out of pocket expense for his medications. Offered her copay cards and she advised she has them but they still have out of pocket. Advised her we do not have money for that and we do not keep samples in house as we are not allowed to do that. She advised she will just call Alyson with [employer] she can help with money to pay for the patients medication. Advised her Clearnce Sorrel is hear until 2 pm everyday.

## 2013-07-22 ENCOUNTER — Other Ambulatory Visit: Payer: Self-pay | Admitting: Urology

## 2013-07-29 ENCOUNTER — Encounter (HOSPITAL_COMMUNITY): Payer: Self-pay | Admitting: Emergency Medicine

## 2013-07-29 ENCOUNTER — Other Ambulatory Visit: Payer: Self-pay | Admitting: Urology

## 2013-07-29 ENCOUNTER — Emergency Department (HOSPITAL_COMMUNITY)
Admission: EM | Admit: 2013-07-29 | Discharge: 2013-07-29 | Disposition: A | Discharge status: Home / Self Care | Payer: Medicare Other | Attending: Emergency Medicine | Admitting: Emergency Medicine

## 2013-07-29 DIAGNOSIS — C61 Malignant neoplasm of prostate: Secondary | ICD-10-CM | POA: Insufficient documentation

## 2013-07-29 DIAGNOSIS — Z862 Personal history of diseases of the blood and blood-forming organs and certain disorders involving the immune mechanism: Secondary | ICD-10-CM | POA: Diagnosis not present

## 2013-07-29 DIAGNOSIS — Z880 Allergy status to penicillin: Secondary | ICD-10-CM | POA: Insufficient documentation

## 2013-07-29 DIAGNOSIS — Z8711 Personal history of peptic ulcer disease: Secondary | ICD-10-CM | POA: Diagnosis not present

## 2013-07-29 DIAGNOSIS — Z8614 Personal history of Methicillin resistant Staphylococcus aureus infection: Secondary | ICD-10-CM | POA: Insufficient documentation

## 2013-07-29 DIAGNOSIS — N182 Chronic kidney disease, stage 2 (mild): Secondary | ICD-10-CM | POA: Insufficient documentation

## 2013-07-29 DIAGNOSIS — Z79899 Other long term (current) drug therapy: Secondary | ICD-10-CM | POA: Diagnosis not present

## 2013-07-29 DIAGNOSIS — K219 Gastro-esophageal reflux disease without esophagitis: Secondary | ICD-10-CM | POA: Diagnosis not present

## 2013-07-29 DIAGNOSIS — IMO0002 Reserved for concepts with insufficient information to code with codable children: Secondary | ICD-10-CM | POA: Diagnosis not present

## 2013-07-29 DIAGNOSIS — Z87820 Personal history of traumatic brain injury: Secondary | ICD-10-CM | POA: Diagnosis not present

## 2013-07-29 DIAGNOSIS — Z21 Asymptomatic human immunodeficiency virus [HIV] infection status: Secondary | ICD-10-CM | POA: Diagnosis not present

## 2013-07-29 DIAGNOSIS — H5789 Other specified disorders of eye and adnexa: Secondary | ICD-10-CM | POA: Insufficient documentation

## 2013-07-29 DIAGNOSIS — J45901 Unspecified asthma with (acute) exacerbation: Secondary | ICD-10-CM | POA: Insufficient documentation

## 2013-07-29 DIAGNOSIS — T7840XA Allergy, unspecified, initial encounter: Secondary | ICD-10-CM

## 2013-07-29 DIAGNOSIS — R220 Localized swelling, mass and lump, head: Secondary | ICD-10-CM | POA: Insufficient documentation

## 2013-07-29 DIAGNOSIS — M129 Arthropathy, unspecified: Secondary | ICD-10-CM | POA: Diagnosis not present

## 2013-07-29 DIAGNOSIS — Y929 Unspecified place or not applicable: Secondary | ICD-10-CM | POA: Insufficient documentation

## 2013-07-29 DIAGNOSIS — Z87891 Personal history of nicotine dependence: Secondary | ICD-10-CM | POA: Diagnosis not present

## 2013-07-29 DIAGNOSIS — R221 Localized swelling, mass and lump, neck: Secondary | ICD-10-CM | POA: Insufficient documentation

## 2013-07-29 DIAGNOSIS — T628X1A Toxic effect of other specified noxious substances eaten as food, accidental (unintentional), initial encounter: Secondary | ICD-10-CM | POA: Insufficient documentation

## 2013-07-29 DIAGNOSIS — Y9389 Activity, other specified: Secondary | ICD-10-CM | POA: Insufficient documentation

## 2013-07-29 DIAGNOSIS — Z95818 Presence of other cardiac implants and grafts: Secondary | ICD-10-CM | POA: Insufficient documentation

## 2013-07-29 DIAGNOSIS — Z8739 Personal history of other diseases of the musculoskeletal system and connective tissue: Secondary | ICD-10-CM | POA: Insufficient documentation

## 2013-07-29 DIAGNOSIS — L272 Dermatitis due to ingested food: Secondary | ICD-10-CM | POA: Diagnosis not present

## 2013-07-29 DIAGNOSIS — Z872 Personal history of diseases of the skin and subcutaneous tissue: Secondary | ICD-10-CM | POA: Diagnosis not present

## 2013-07-29 DIAGNOSIS — H05229 Edema of unspecified orbit: Secondary | ICD-10-CM | POA: Diagnosis not present

## 2013-07-29 MED ORDER — PREDNISONE 20 MG PO TABS: 20.0000 mg | ORAL_TABLET | Freq: Two times a day (BID) | ORAL | Status: DC

## 2013-07-29 MED ORDER — FAMOTIDINE 20 MG PO TABS
20.0000 mg | ORAL_TABLET | Freq: Once | ORAL | Status: AC
  Administered 2013-07-29: 20 mg via ORAL
  Filled 2013-07-29: qty 1

## 2013-07-29 MED ORDER — ACETAMINOPHEN 500 MG PO TABS
1000.0000 mg | ORAL_TABLET | Freq: Once | ORAL | Status: AC
  Administered 2013-07-29: 1000 mg via ORAL
  Filled 2013-07-29: qty 2

## 2013-07-29 MED ORDER — PREDNISONE 20 MG PO TABS
60.0000 mg | ORAL_TABLET | Freq: Once | ORAL | Status: AC
  Administered 2013-07-29: 60 mg via ORAL
  Filled 2013-07-29: qty 3

## 2013-07-29 MED ORDER — PREDNISONE 20 MG PO TABS
20.0000 mg | ORAL_TABLET | Freq: Two times a day (BID) | ORAL | Status: DC
Start: 2013-07-29 — End: 2013-08-07

## 2013-07-29 MED ORDER — DIPHENHYDRAMINE HCL 50 MG/ML IJ SOLN: 25.0000 mg | Freq: Once | INTRAMUSCULAR | Status: DC

## 2013-07-29 MED ORDER — DIPHENHYDRAMINE HCL 25 MG PO CAPS
25.0000 mg | ORAL_CAPSULE | Freq: Once | ORAL | Status: AC
Start: 2013-07-29 — End: 2013-07-29
  Administered 2013-07-29: 25 mg via ORAL
  Filled 2013-07-29: qty 1

## 2013-07-29 NOTE — ED Provider Notes (Signed)
CSN: [id number]     Arrival date & time 07/29/13  1912 History   First MD Initiated Contact with Patient 07/29/13 1934     Chief Complaint  Patient presents with  . Allergic Reaction  . Shortness of Breath   (Consider location/radiation/quality/duration/timing/severity/associated sxs/prior Treatment) Patient is a 54 y.o. male presenting with allergic reaction and shortness of breath. The history is provided by the patient.  Allergic Reaction Shortness of Breath  He complains of swelling of eyelids and lips after eating crackers and cheese, this evening. He denies weakness, dizziness, shortness of breath, nausea, or vomiting. He has not had this previously. He's taking his medicines as prescribed. He is due to have surgery for prostate cancer next week. There are no other known modifying factors.  Past Medical History  Diagnosis Date  . HIV (human immunodeficiency virus infection) 1988  . Asthma   . Chronic anemia     normocytic  . Chronic folliculitis   . Gross hematuria   . Elevated PSA   . H/O pericarditis     2010--  myopercarditis--  resolved  . History of concussion     2012  &  2013  RESIDUAL HA'S --  RESOLVED  . Axillary lymphadenopathy   . History of MRSA infection     infected boil  . History of gastric ulcer   . CKD (chronic kidney disease), stage II     nephrologist-  dr patel (secondary to hiv vs htn)  . Bilateral renal cysts   . Lytic bone lesion of hip     WORK-UP DONE BY ONCOLOGIST DR HA --  NOT MALIGNANT  . Wears glasses   . GERD (gastroesophageal reflux disease)   . Headache(784.0)     HX SEVERE FRONTAL HA'S  . Prostate cancer 04/25/13    gleason 3+3=6, volume 30 gm  . Ulcer     hx of gastric  . Arthritis    Past Surgical History  Procedure Laterality Date  . Upper leg soft tissue biopsy      left thigh  . Knee arthroscopy Right 1985  . Colonoscopy  12/26/2011    Procedure: COLONOSCOPY;  Surgeon: Lear Ng, MD;  Location: [facility];   Service: Endoscopy;  Laterality: N/A;  . Esophagogastroduodenoscopy  12/26/2011    Procedure: ESOPHAGOGASTRODUODENOSCOPY (EGD);  Surgeon: Lear Ng, MD;  Location: [facility];  Service: Endoscopy;  Laterality: N/A;  . Cardiac catheterization  03-23-2009  DR COOPER    NORMAL CORONARY ARTERIES  . Excisional bx left breast mass/  i  &  d left breast abscess  03-24-2009  . Excision chronic left breast abscess  09-21-2010  . Cystoscopy/retrograde/ureteroscopy Bilateral 04/25/2013    Procedure: CYSTOSCOPY/ BILATERAL RETROGRADES; BLADDER BIOPSIES;  Surgeon: Alexis Frock, MD;  Location: [facility];  Service: Urology;  Laterality: Bilateral;  . Prostate biopsy N/A 04/25/2013    Procedure: BIOPSY TRANSRECTAL ULTRASONIC PROSTATE (TUBP);  Surgeon: Alexis Frock, MD;  Location: [facility];  Service: Urology;  Laterality: N/A;  . Left hip biopsy  10/2012  . Left axilla biopsy  04/2013   Family History  Problem Relation Age of Onset  . Stroke Father   . Diabetes Father   . Cancer Father     brain cancer  . Hypertension Sister   . Cancer Maternal Uncle     prostate cancer   History  Substance Use Topics  . Smoking status: Former Smoker    Types: Cigars    Quit  date: 05/22/2010  . Smokeless tobacco: Never Used  . Alcohol Use: No     Comment: rare    Review of Systems  Respiratory: Positive for shortness of breath.   All other systems reviewed and are negative.    Allergies  Penicillins; Sulfonamide derivatives; and Morphine and related  Home Medications   Current Outpatient Rx  Name  Route  Sig  Dispense  Refill  . Beclomethasone Dipropionate (QNASL) 80 MCG/ACT AERS   Nasal   Place 80 mcg into the nose 2 (two) times daily.   8.7 g   1   . Darunavir Ethanolate (PREZISTA) 800 MG tablet   Oral   Take 800 mg by mouth at bedtime. Take with Norvir and Truvada.         . docusate sodium 100 MG CAPS   Oral   Take 100 mg by mouth  daily.   30 capsule   3   . emtricitabine-tenofovir (TRUVADA) 200-300 MG per tablet   Oral   Take 1 tablet by mouth at bedtime. Take with Prezista and Norvir.         [name] gabapentin (NEURONTIN) 600 MG tablet   Oral   Take 600 mg by mouth 4 (four) times daily as needed (For pain.).          [name] ipratropium (ATROVENT) 0.02 % nebulizer solution   Nebulization   Take 500 mcg by nebulization 3 (three) times daily as needed for wheezing.         . levalbuterol (XOPENEX HFA) 45 MCG/ACT inhaler   Inhalation   Inhale 1-2 puffs into the lungs every 4 (four) hours as needed for wheezing.   1 Inhaler   12   . levalbuterol (XOPENEX) 0.63 MG/3ML nebulizer solution   Nebulization   Take 1 ampule by nebulization 3 (three) times daily as needed for wheezing.         [name] loratadine (CLARITIN) 10 MG tablet   Oral   Take 10 mg by mouth daily as needed for allergies.          . Multiple Vitamin (MULTIVITAMIN WITH MINERALS) TABS   Oral   Take 1 tablet by mouth at bedtime.          [name] omeprazole (PRILOSEC) 40 MG capsule   Oral   Take 40 mg by mouth at bedtime.          . ondansetron (ZOFRAN) 4 MG tablet   Oral   Take 1 tablet (4 mg total) by mouth every 8 (eight) hours as needed for nausea.   30 tablet   3   . QVAR 80 MCG/ACT inhaler   Inhalation   Inhale 1 puff into the lungs daily as needed (wheezing).          . raltegravir (ISENTRESS) 400 MG tablet   Oral   Take 400 mg by mouth 2 (two) times daily.         . ritonavir (NORVIR) 100 MG capsule   Oral   Take 100 mg by mouth at bedtime. Take with Prezista and Truvada.         . senna (SENOKOT) 8.6 MG TABS tablet   Oral   Take 1 tablet (8.6 mg total) by mouth daily.   120 each   0   . tizanidine (ZANAFLEX) 6 MG capsule   Oral   Take 1 capsule (6 mg total) by mouth 3 (three) times daily as needed for muscle spasms.   90 capsule  3   . traZODone (DESYREL) 50 MG tablet   Oral   Take 50 mg by mouth at  bedtime.         [name] zolpidem (AMBIEN CR) 12.5 MG CR tablet   Oral   Take 1 tablet (12.5 mg total) by mouth at bedtime as needed. For sleep   30 tablet   0   . predniSONE (DELTASONE) 20 MG tablet   Oral   Take 1 tablet (20 mg total) by mouth 2 (two) times daily.   10 tablet   0    BP 131/77  Pulse 79  Temp(Src) 98.2 F (36.8 C) (Oral)  Resp 18  Ht 5\' 9"  (1.753 m)  Wt 201 lb (91.173 kg)  BMI 29.67 kg/m2  SpO2 98% Physical Exam  Nursing note and vitals reviewed. Constitutional: He is oriented to person, place, and time. He appears well-developed and well-nourished.  HENT:  Head: Normocephalic and atraumatic.  Right Ear: External ear normal.  Left Ear: External ear normal.  No lingual or labial edema. The airway is intact.  Eyes: Conjunctivae and EOM are normal. Pupils are equal, round, and reactive to light.  Mild periorbital edema.  Neck: Normal range of motion and phonation normal. Neck supple.  Cardiovascular: Normal rate, regular rhythm, normal heart sounds and intact distal pulses.   Pulmonary/Chest: Effort normal and breath sounds normal. He exhibits no bony tenderness.  Abdominal: Soft. Normal appearance. There is no tenderness.  Musculoskeletal: Normal range of motion.  Neurological: He is alert and oriented to person, place, and time. No cranial nerve deficit or sensory deficit. He exhibits normal muscle tone. Coordination normal.  Skin: Skin is warm, dry and intact.  Psychiatric: He has a normal mood and affect. His behavior is normal. Judgment and thought content normal.    ED Course  Procedures (including critical care time) Medications  predniSONE (DELTASONE) tablet 60 mg (60 mg Oral Given 07/29/13 1946)  famotidine (PEPCID) tablet 20 mg (20 mg Oral Given 07/29/13 1946)  diphenhydrAMINE (BENADRYL) capsule 25 mg (25 mg Oral Given 07/29/13 2121)  acetaminophen (TYLENOL) tablet 1,000 mg (1,000 mg Oral Given 07/29/13 2201)    Patient Vitals for the past 24  hrs:  BP Temp Temp src Pulse Resp SpO2 Height Weight  07/29/13 2300 131/77 mmHg - - - - - - -  07/29/13 2200 137/76 mmHg - - - - - - -  07/29/13 2158 133/76 mmHg - - - 18 - - -  07/29/13 2145 133/76 mmHg - - - - - - -  07/29/13 2130 129/79 mmHg - - - - - - -  07/29/13 2115 126/80 mmHg - - - - - - -  07/29/13 2100 132/82 mmHg - - - - - - -  07/29/13 2045 127/77 mmHg - - - - - - -  07/29/13 2036 135/79 mmHg - - - - - - -  07/29/13 1921 - - - - - - 5\' 9"  (1.753 m) 201 lb (91.173 kg)  07/29/13 1919 125/93 mmHg 98.2 F (36.8 C) Oral 79 18 98 % - -    11:26 PM Reevaluation with update and discussion. After initial assessment and treatment, an updated evaluation reveals he feels better and has no additional complaints. There is no additional swelling of the face or oral mucosa. Meyli Boice L     MDM   1. Allergic reaction    Nonspecific allergic reaction with anaphylaxis or significant internal reaction.  Nursing Notes Reviewed/ Care Coordinated Applicable Imaging  Reviewed Interpretation of Laboratory Data incorporated into ED treatment  The patient appears reasonably screened and/or stabilized for discharge and I doubt any other medical condition or other [facility] requiring further screening, evaluation, or treatment in the ED at this time prior to discharge.  Plan: Home Medications- Prednisone, H1 and H2 blockers; Home Treatments- Rest; return here if the recommended treatment, does not improve the symptoms; Recommended follow up- PCP prn   Richarda Blade, MD 07/29/13 2334

## 2013-07-29 NOTE — Discharge Instructions (Signed)
Continue taking Benadryl 25 mg 4 times a day, and Pepcid 20 mg 2 times a day for 5 days. See your doctor or return here as needed for problems.     Allergies  Allergies may happen from anything your body is sensitive to. This may be food, medicines, pollens, chemicals, and many other things. Food allergies can be severe and deadly.  HOME CARE  If you do not know what causes a reaction, keep a diary. Write down the foods you ate and the symptoms that followed. Avoid foods that cause reactions.  If you have red raised spots (hives) or a rash:  Take medicine as told by your doctor.  Use medicines for red raised spots and itching as needed.  Apply cold cloths (compresses) to the skin. Take a cool bath. Avoid hot baths or showers.  If you are severely allergic:  It is often necessary to go to the hospital after you have treated your reaction.  Wear your medical alert jewelry.  You and your family must learn how to give a allergy shot or use an allergy kit (anaphylaxis kit).  Always carry your allergy kit or shot with you. Use this medicine as told by your doctor if a severe reaction is occurring. GET HELP RIGHT AWAY IF:  You have trouble breathing or are making high-pitched whistling sounds (wheezing).  You have a tight feeling in your chest or throat.  You have a puffy (swollen) mouth.  You have red raised spots, puffiness (swelling), or itching all over your body.  You have had a severe reaction that was helped by your allergy kit or shot. The reaction can return once the medicine has worn off.  You think you are having a food allergy. Symptoms most often happen within 30 minutes of eating a food.  Your symptoms have not gone away within 2 days or are getting worse.  You have new symptoms.  You want to retest yourself with a food or drink you think causes an allergic reaction. Only do this under the care of a doctor. MAKE SURE YOU:   Understand these  instructions.  Will watch your condition.  Will get help right away if you are not doing well or get worse. Document Released: 10/14/2012 Document Reviewed: 10/14/2012 [facility] Patient Information 2014 [place], [place].

## 2013-07-29 NOTE — ED Notes (Signed)
Patient stated about 5pm he started feeling SOB and noticed he was swelling around his eyes.  He took 50mg  Benadryl at home.  Denies new meds, foods, clothing, soaps, etc.  Stated the only thing he ate today was some cheese and crackers which he has eaten before.

## 2013-07-29 NOTE — ED Notes (Signed)
Pt. Has c/o headache.

## 2013-07-29 NOTE — ED Notes (Signed)
Patient stated he has noticed that the swelling around his eyes has gone down slightly.  Face still remains swollen around the eyes

## 2013-08-07 ENCOUNTER — Encounter (HOSPITAL_COMMUNITY): Payer: Self-pay | Admitting: Pharmacy Technician

## 2013-08-08 ENCOUNTER — Encounter (HOSPITAL_COMMUNITY): Payer: Self-pay

## 2013-08-08 ENCOUNTER — Other Ambulatory Visit (HOSPITAL_COMMUNITY): Payer: Self-pay | Admitting: Urology

## 2013-08-08 NOTE — Progress Notes (Signed)
ekg 110-30-14 epic Chest xray 2 view 05-31-13 epic

## 2013-08-08 NOTE — Patient Instructions (Addendum)
[place]  08/08/2013   Your procedure is scheduled on: Monday February 16th  Report to [facility] at 515 AM.  Call this number if you have problems the morning of surgery [phone]   Remember: please bring inhaler on day of surgery   Do not eat food or drink liquids :After Midnight.     Take these medicines the morning of surgery with A SIP OF WATER: gabapentin if needed, inhaler if needed                                [place] may not have any metal on your body including hair pins and piercings  Do not wear jewelry, make-up.  Do not wear lotions, powders, or perfumes. No  Deodorant is to be worn.   Men may shave face and neck.  Do not bring valuables to the hospital. [place].  Contacts, dentures or bridgework may not be worn into surgery.  Leave suitcase in the car. After surgery it may be brought to your room.  For patients admitted to the hospital, checkout time is 11:00 AM the day of discharge.   Please read over the following fact sheets that you were given: [facility] Preparing for surgery sheet, , blood fact sheet  Call Paulette Blanch  RN pre op nurse if needed [phone]    [place].  PATIENT SIGNATURE___________________________________________  NURSE SIGNATURE_____________________________________________

## 2013-08-11 ENCOUNTER — Encounter (HOSPITAL_COMMUNITY): Payer: Self-pay

## 2013-08-11 ENCOUNTER — Encounter (HOSPITAL_COMMUNITY)
Admission: RE | Admit: 2013-08-11 | Discharge: 2013-08-11 | Disposition: A | Discharge status: Home / Self Care | Payer: Medicare Other | Source: Ambulatory Visit | Attending: Urology | Admitting: Urology

## 2013-08-11 DIAGNOSIS — Z01812 Encounter for preprocedural laboratory examination: Secondary | ICD-10-CM | POA: Insufficient documentation

## 2013-08-11 HISTORY — DX: Postconcussional syndrome: F07.81

## 2013-08-11 LAB — BASIC METABOLIC PANEL
BUN: 9 mg/dL (ref 6–23)
CO2: 26 mEq/L (ref 19–32)
Calcium: 8.8 mg/dL (ref 8.4–10.5)
Chloride: 101 mEq/L (ref 96–112)
Creatinine, Ser: 1.16 mg/dL (ref 0.50–1.35)
GFR calc Af Amer: 83 mL/min — ABNORMAL LOW (ref >90)
GFR calc non Af Amer: 72 mL/min — ABNORMAL LOW (ref >90)
Glucose, Bld: 102 mg/dL — ABNORMAL HIGH (ref 70–99)
Potassium: 4 mEq/L (ref 3.7–5.3)
Sodium: 137 mEq/L (ref 137–147)

## 2013-08-11 LAB — CBC
HCT: 32.1 % — ABNORMAL LOW (ref 39.0–52.0)
Hemoglobin: 9.8 g/dL — ABNORMAL LOW (ref 13.0–17.0)
MCH: 23.8 pg — ABNORMAL LOW (ref 26.0–34.0)
MCHC: 30.5 g/dL (ref 30.0–36.0)
MCV: 77.9 fL — ABNORMAL LOW (ref 78.0–100.0)
Platelets: 209 10*3/uL (ref 150–400)
RBC: 4.12 MIL/uL — ABNORMAL LOW (ref 4.22–5.81)
RDW: 21 % — ABNORMAL HIGH (ref 11.5–15.5)
WBC: 5.7 10*3/uL (ref 4.0–10.5)

## 2013-08-11 LAB — ABO/RH: ABO/RH(D): B NEG

## 2013-08-11 LAB — SURGICAL PCR SCREEN
MRSA, PCR: NEGATIVE
Staphylococcus aureus: NEGATIVE

## 2013-08-18 ENCOUNTER — Encounter (HOSPITAL_COMMUNITY): Payer: Self-pay

## 2013-08-18 ENCOUNTER — Inpatient Hospital Stay (HOSPITAL_COMMUNITY): Payer: Medicare Other | Admitting: Anesthesiology

## 2013-08-18 ENCOUNTER — Encounter (HOSPITAL_COMMUNITY)
Admission: RE | Disposition: A | Discharge status: Home / Self Care | Payer: Self-pay | Source: Ambulatory Visit | Attending: Urology

## 2013-08-18 ENCOUNTER — Encounter (HOSPITAL_COMMUNITY): Payer: Medicare Other | Admitting: Anesthesiology

## 2013-08-18 ENCOUNTER — Inpatient Hospital Stay (HOSPITAL_COMMUNITY)
Admission: RE | Admit: 2013-08-18 | Discharge: 2013-08-20 | DRG: 707 | Disposition: A | Discharge status: Home / Self Care | Payer: Medicare Other | Source: Ambulatory Visit | Attending: Urology | Admitting: Urology

## 2013-08-18 DIAGNOSIS — C61 Malignant neoplasm of prostate: Principal | ICD-10-CM | POA: Diagnosis present

## 2013-08-18 DIAGNOSIS — Z87891 Personal history of nicotine dependence: Secondary | ICD-10-CM | POA: Diagnosis not present

## 2013-08-18 DIAGNOSIS — Z885 Allergy status to narcotic agent status: Secondary | ICD-10-CM

## 2013-08-18 DIAGNOSIS — Q619 Cystic kidney disease, unspecified: Secondary | ICD-10-CM | POA: Diagnosis not present

## 2013-08-18 DIAGNOSIS — Z808 Family history of malignant neoplasm of other organs or systems: Secondary | ICD-10-CM | POA: Diagnosis not present

## 2013-08-18 DIAGNOSIS — N182 Chronic kidney disease, stage 2 (mild): Secondary | ICD-10-CM | POA: Diagnosis present

## 2013-08-18 DIAGNOSIS — Z21 Asymptomatic human immunodeficiency virus [HIV] infection status: Secondary | ICD-10-CM | POA: Diagnosis present

## 2013-08-18 DIAGNOSIS — J45909 Unspecified asthma, uncomplicated: Secondary | ICD-10-CM | POA: Diagnosis not present

## 2013-08-18 DIAGNOSIS — Z8042 Family history of malignant neoplasm of prostate: Secondary | ICD-10-CM | POA: Diagnosis not present

## 2013-08-18 DIAGNOSIS — Z888 Allergy status to other drugs, medicaments and biological substances status: Secondary | ICD-10-CM

## 2013-08-18 DIAGNOSIS — Z833 Family history of diabetes mellitus: Secondary | ICD-10-CM

## 2013-08-18 DIAGNOSIS — Z79899 Other long term (current) drug therapy: Secondary | ICD-10-CM

## 2013-08-18 DIAGNOSIS — Z8614 Personal history of Methicillin resistant Staphylococcus aureus infection: Secondary | ICD-10-CM | POA: Diagnosis not present

## 2013-08-18 DIAGNOSIS — Z880 Allergy status to penicillin: Secondary | ICD-10-CM

## 2013-08-18 DIAGNOSIS — Z8711 Personal history of peptic ulcer disease: Secondary | ICD-10-CM | POA: Diagnosis not present

## 2013-08-18 DIAGNOSIS — M129 Arthropathy, unspecified: Secondary | ICD-10-CM | POA: Diagnosis present

## 2013-08-18 DIAGNOSIS — Z8546 Personal history of malignant neoplasm of prostate: Secondary | ICD-10-CM | POA: Diagnosis present

## 2013-08-18 DIAGNOSIS — Z01812 Encounter for preprocedural laboratory examination: Secondary | ICD-10-CM | POA: Diagnosis not present

## 2013-08-18 DIAGNOSIS — R310 Gross hematuria: Secondary | ICD-10-CM | POA: Diagnosis not present

## 2013-08-18 HISTORY — PX: LYMPHADENECTOMY: SHX5960

## 2013-08-18 HISTORY — PX: ROBOT ASSISTED LAPAROSCOPIC RADICAL PROSTATECTOMY: SHX5141

## 2013-08-18 LAB — HEMOGLOBIN AND HEMATOCRIT, BLOOD
HCT: 30.5 % — ABNORMAL LOW (ref 39.0–52.0)
Hemoglobin: 9.6 g/dL — ABNORMAL LOW (ref 13.0–17.0)

## 2013-08-18 LAB — TYPE AND SCREEN
ABO/RH(D): B NEG
Antibody Screen: NEGATIVE

## 2013-08-18 SURGERY — ROBOTIC ASSISTED LAPAROSCOPIC RADICAL PROSTATECTOMY
Anesthesia: General | Site: Abdomen

## 2013-08-18 MED ORDER — NEOSTIGMINE METHYLSULFATE 1 MG/ML IJ SOLN
INTRAMUSCULAR | Status: DC | PRN
  Administered 2013-08-18: 3 mg via INTRAVENOUS

## 2013-08-18 MED ORDER — ONDANSETRON HCL 4 MG/2ML IJ SOLN
4.0000 mg | INTRAMUSCULAR | Status: DC | PRN
  Administered 2013-08-18 – 2013-08-20 (×6): 4 mg via INTRAVENOUS
  Filled 2013-08-18 (×6): qty 2

## 2013-08-18 MED ORDER — FENTANYL CITRATE 0.05 MG/ML IJ SOLN
25.0000 ug | INTRAMUSCULAR | Status: DC | PRN
  Administered 2013-08-18 (×2): 50 ug via INTRAVENOUS
  Administered 2013-08-18: 25 ug via INTRAVENOUS
  Administered 2013-08-18 – 2013-08-19 (×3): 50 ug via INTRAVENOUS
  Filled 2013-08-18 (×6): qty 2

## 2013-08-18 MED ORDER — LACTATED RINGERS IR SOLN
Status: DC | PRN
  Administered 2013-08-18: 1000 mL

## 2013-08-18 MED ORDER — CISATRACURIUM BESYLATE (PF) 10 MG/5ML IV SOLN
INTRAVENOUS | Status: DC | PRN
  Administered 2013-08-18: 6 mg via INTRAVENOUS
  Administered 2013-08-18: 10 mg via INTRAVENOUS
  Administered 2013-08-18: 4 mg via INTRAVENOUS

## 2013-08-18 MED ORDER — HYDRALAZINE HCL 20 MG/ML IJ SOLN
INTRAMUSCULAR | Status: AC
  Filled 2013-08-18: qty 1

## 2013-08-18 MED ORDER — HEPARIN SODIUM (PORCINE) 1000 UNIT/ML IJ SOLN
INTRAMUSCULAR | Status: AC
  Filled 2013-08-18: qty 1

## 2013-08-18 MED ORDER — SODIUM CHLORIDE 0.9 % IV BOLUS (SEPSIS): 1000.0000 mL | Freq: Once | INTRAVENOUS | Status: DC

## 2013-08-18 MED ORDER — METHYLENE BLUE 1 % INJ SOLN
INTRAMUSCULAR | Status: AC
  Filled 2013-08-18: qty 10

## 2013-08-18 MED ORDER — RITONAVIR 100 MG PO CAPS
100.0000 mg | ORAL_CAPSULE | Freq: Every day | ORAL | Status: DC
  Administered 2013-08-18 – 2013-08-20 (×3): 100 mg via ORAL
  Filled 2013-08-18 (×3): qty 1

## 2013-08-18 MED ORDER — SODIUM CHLORIDE 0.9 % IJ SOLN
INTRAMUSCULAR | Status: AC
  Filled 2013-08-18: qty 20

## 2013-08-18 MED ORDER — IPRATROPIUM BROMIDE 0.02 % IN SOLN: 0.5000 mg | Freq: Three times a day (TID) | RESPIRATORY_TRACT | Status: DC | PRN

## 2013-08-18 MED ORDER — TRAZODONE HCL 50 MG PO TABS
50.0000 mg | ORAL_TABLET | Freq: Every day | ORAL | Status: DC
  Administered 2013-08-18 – 2013-08-19 (×2): 50 mg via ORAL
  Filled 2013-08-18 (×3): qty 1

## 2013-08-18 MED ORDER — SODIUM CHLORIDE 0.9 % IR SOLN
Status: DC | PRN
  Administered 2013-08-18: 1000 mL

## 2013-08-18 MED ORDER — RITONAVIR 100 MG PO CAPS
100.0000 mg | ORAL_CAPSULE | Freq: Every day | ORAL | Status: DC
  Filled 2013-08-18: qty 1

## 2013-08-18 MED ORDER — DARUNAVIR ETHANOLATE 800 MG PO TABS
800.0000 mg | ORAL_TABLET | Freq: Every day | ORAL | Status: DC
  Administered 2013-08-18 – 2013-08-20 (×3): 800 mg via ORAL
  Filled 2013-08-18 (×3): qty 1

## 2013-08-18 MED ORDER — GABAPENTIN 400 MG PO CAPS
400.0000 mg | ORAL_CAPSULE | Freq: Four times a day (QID) | ORAL | Status: DC | PRN
  Filled 2013-08-18: qty 1

## 2013-08-18 MED ORDER — PROMETHAZINE HCL 25 MG/ML IJ SOLN: 6.2500 mg | INTRAMUSCULAR | Status: DC | PRN

## 2013-08-18 MED ORDER — CIPROFLOXACIN IN D5W 400 MG/200ML IV SOLN
400.0000 mg | Freq: Two times a day (BID) | INTRAVENOUS | Status: DC
  Administered 2013-08-18: 400 mg via INTRAVENOUS

## 2013-08-18 MED ORDER — FENTANYL CITRATE 0.05 MG/ML IJ SOLN
INTRAMUSCULAR | Status: AC
  Filled 2013-08-18: qty 2

## 2013-08-18 MED ORDER — FENTANYL CITRATE 0.05 MG/ML IJ SOLN
INTRAMUSCULAR | Status: DC | PRN
  Administered 2013-08-18: 25 ug via INTRAVENOUS
  Administered 2013-08-18: 100 ug via INTRAVENOUS
  Administered 2013-08-18: 25 ug via INTRAVENOUS
  Administered 2013-08-18 (×3): 50 ug via INTRAVENOUS
  Administered 2013-08-18 (×2): 25 ug via INTRAVENOUS

## 2013-08-18 MED ORDER — CLINDAMYCIN PHOSPHATE 900 MG/50ML IV SOLN
900.0000 mg | Freq: Once | INTRAVENOUS | Status: AC
  Administered 2013-08-18: 900 mg via INTRAVENOUS
  Filled 2013-08-18: qty 50

## 2013-08-18 MED ORDER — KCL IN DEXTROSE-NACL 20-5-0.45 MEQ/L-%-% IV SOLN
INTRAVENOUS | Status: DC
Start: 2013-08-18 — End: 2013-08-20
  Administered 2013-08-18 – 2013-08-19 (×3): via INTRAVENOUS
  Filled 2013-08-18 (×5): qty 1000

## 2013-08-18 MED ORDER — RALTEGRAVIR POTASSIUM 400 MG PO TABS
800.0000 mg | ORAL_TABLET | Freq: Every day | ORAL | Status: DC
  Filled 2013-08-18: qty 2

## 2013-08-18 MED ORDER — EMTRICITABINE-TENOFOVIR DF 200-300 MG PO TABS
1.0000 | ORAL_TABLET | Freq: Every day | ORAL | Status: DC
  Filled 2013-08-18: qty 1

## 2013-08-18 MED ORDER — GLYCOPYRROLATE 0.2 MG/ML IJ SOLN
INTRAMUSCULAR | Status: DC | PRN
  Administered 2013-08-18: 0.4 mg via INTRAVENOUS

## 2013-08-18 MED ORDER — SODIUM CHLORIDE 0.9 % IV BOLUS (SEPSIS)
1000.0000 mL | Freq: Once | INTRAVENOUS | Status: AC
  Administered 2013-08-18: 1000 mL via INTRAVENOUS

## 2013-08-18 MED ORDER — SODIUM CHLORIDE 0.9 % IJ SOLN
INTRAMUSCULAR | Status: DC | PRN
  Administered 2013-08-18: 20 mL

## 2013-08-18 MED ORDER — TRAMADOL HCL 50 MG PO TABS: 50.0000 mg | ORAL_TABLET | ORAL | Status: DC | PRN

## 2013-08-18 MED ORDER — KETAMINE HCL 10 MG/ML IJ SOLN
INTRAMUSCULAR | Status: DC | PRN
  Administered 2013-08-18: 25 mg via INTRAVENOUS

## 2013-08-18 MED ORDER — BUPIVACAINE LIPOSOME 1.3 % IJ SUSP
INTRAMUSCULAR | Status: DC | PRN
  Administered 2013-08-18: 20 mL

## 2013-08-18 MED ORDER — HEPARIN SODIUM (PORCINE) 1000 UNIT/ML IJ SOLN
INTRAMUSCULAR | Status: DC | PRN
  Administered 2013-08-18: 1000 [IU]

## 2013-08-18 MED ORDER — ALBUTEROL SULFATE (2.5 MG/3ML) 0.083% IN NEBU
2.5000 mg | INHALATION_SOLUTION | RESPIRATORY_TRACT | Status: DC | PRN
Start: 2013-08-18 — End: 2013-08-20

## 2013-08-18 MED ORDER — SODIUM CHLORIDE 0.9 % IV SOLN
INTRAVENOUS | Status: DC | PRN
  Administered 2013-08-18: 07:00:00 via INTRAVENOUS

## 2013-08-18 MED ORDER — SENNA 8.6 MG PO TABS
1.0000 | ORAL_TABLET | Freq: Two times a day (BID) | ORAL | Status: DC
  Administered 2013-08-18 – 2013-08-20 (×4): 8.6 mg via ORAL
  Filled 2013-08-18 (×4): qty 1

## 2013-08-18 MED ORDER — EMTRICITABINE-TENOFOVIR DF 200-300 MG PO TABS
1.0000 | ORAL_TABLET | Freq: Every day | ORAL | Status: DC
  Administered 2013-08-18 – 2013-08-20 (×3): 1 via ORAL
  Filled 2013-08-18 (×3): qty 1

## 2013-08-18 MED ORDER — HYDRALAZINE HCL 20 MG/ML IJ SOLN
5.0000 mg | INTRAMUSCULAR | Status: DC | PRN
  Administered 2013-08-18: 5 mg via INTRAVENOUS
  Administered 2013-08-18: 10 mg via INTRAVENOUS

## 2013-08-18 MED ORDER — CIPROFLOXACIN IN D5W 400 MG/200ML IV SOLN
INTRAVENOUS | Status: AC
  Filled 2013-08-18: qty 200

## 2013-08-18 MED ORDER — SUCCINYLCHOLINE CHLORIDE 20 MG/ML IJ SOLN
INTRAMUSCULAR | Status: DC | PRN
  Administered 2013-08-18: 100 mg via INTRAVENOUS

## 2013-08-18 MED ORDER — FLUTICASONE PROPIONATE HFA 44 MCG/ACT IN AERO
1.0000 | INHALATION_SPRAY | Freq: Two times a day (BID) | RESPIRATORY_TRACT | Status: DC
  Administered 2013-08-18 – 2013-08-20 (×4): 1 via RESPIRATORY_TRACT
  Filled 2013-08-18: qty 10.6

## 2013-08-18 MED ORDER — RALTEGRAVIR POTASSIUM 400 MG PO TABS
800.0000 mg | ORAL_TABLET | Freq: Every day | ORAL | Status: DC
  Administered 2013-08-18 – 2013-08-20 (×3): 800 mg via ORAL
  Filled 2013-08-18 (×3): qty 2

## 2013-08-18 MED ORDER — MIDAZOLAM HCL 5 MG/5ML IJ SOLN
INTRAMUSCULAR | Status: DC | PRN
  Administered 2013-08-18 (×2): 1 mg via INTRAVENOUS

## 2013-08-18 MED ORDER — PROPOFOL 10 MG/ML IV BOLUS
INTRAVENOUS | Status: DC | PRN
  Administered 2013-08-18: 175 mg via INTRAVENOUS

## 2013-08-18 MED ORDER — DOCUSATE SODIUM 100 MG PO CAPS
100.0000 mg | ORAL_CAPSULE | Freq: Two times a day (BID) | ORAL | Status: DC
  Administered 2013-08-18 – 2013-08-20 (×4): 100 mg via ORAL
  Filled 2013-08-18 (×5): qty 1

## 2013-08-18 MED ORDER — ONDANSETRON HCL 4 MG/2ML IJ SOLN
INTRAMUSCULAR | Status: DC | PRN
  Administered 2013-08-18 (×2): 2 mg via INTRAVENOUS

## 2013-08-18 MED ORDER — CIPROFLOXACIN HCL 500 MG PO TABS: 500.0000 mg | ORAL_TABLET | Freq: Two times a day (BID) | ORAL | Status: DC

## 2013-08-18 MED ORDER — FENTANYL CITRATE 0.05 MG/ML IJ SOLN
25.0000 ug | INTRAMUSCULAR | Status: DC | PRN
  Administered 2013-08-18: 25 ug via INTRAVENOUS

## 2013-08-18 MED ORDER — PNEUMOCOCCAL VAC POLYVALENT 25 MCG/0.5ML IJ INJ
0.5000 mL | INJECTION | INTRAMUSCULAR | Status: AC
  Administered 2013-08-19: 0.5 mL via INTRAMUSCULAR
  Filled 2013-08-18 (×2): qty 0.5

## 2013-08-18 MED ORDER — STERILE WATER FOR IRRIGATION IR SOLN
Status: DC | PRN
  Administered 2013-08-18: 3000 mL

## 2013-08-18 MED ORDER — TIZANIDINE HCL 4 MG PO TABS
6.0000 mg | ORAL_TABLET | Freq: Three times a day (TID) | ORAL | Status: DC | PRN
  Filled 2013-08-18: qty 2

## 2013-08-18 MED ORDER — DARUNAVIR ETHANOLATE 800 MG PO TABS
800.0000 mg | ORAL_TABLET | Freq: Every day | ORAL | Status: DC
  Filled 2013-08-18: qty 1

## 2013-08-18 MED ORDER — DEXAMETHASONE SODIUM PHOSPHATE 4 MG/ML IJ SOLN
INTRAMUSCULAR | Status: DC | PRN
  Administered 2013-08-18: 10 mg via INTRAVENOUS

## 2013-08-18 MED ORDER — BUPIVACAINE LIPOSOME 1.3 % IJ SUSP
20.0000 mL | Freq: Once | INTRAMUSCULAR | Status: DC
  Filled 2013-08-18: qty 20

## 2013-08-18 MED ORDER — TRAMADOL HCL 50 MG PO TABS
50.0000 mg | ORAL_TABLET | Freq: Four times a day (QID) | ORAL | Status: DC | PRN
  Administered 2013-08-19 – 2013-08-20 (×4): 100 mg via ORAL
  Filled 2013-08-18 (×4): qty 2

## 2013-08-18 MED ORDER — ACETAMINOPHEN 500 MG PO TABS
1000.0000 mg | ORAL_TABLET | Freq: Three times a day (TID) | ORAL | Status: AC
  Administered 2013-08-18 – 2013-08-19 (×3): 1000 mg via ORAL
  Filled 2013-08-18 (×3): qty 2

## 2013-08-18 SURGICAL SUPPLY — 54 items
ADH SKN CLS APL DERMABOND .7 (GAUZE/BANDAGES/DRESSINGS) ×2
CABLE HIGH FREQUENCY MONO STRZ (ELECTRODE) ×3 IMPLANT
CANISTER SUCTION 2500CC (MISCELLANEOUS) ×3 IMPLANT
CATH FOLEY 2WAY SLVR 18FR 30CC (CATHETERS) ×4 IMPLANT
CATH TIEMANN FOLEY 18FR 5CC (CATHETERS) ×3 IMPLANT
CHLORAPREP W/TINT 26ML (MISCELLANEOUS) ×3 IMPLANT
CLIP LIGATING HEM O LOK PURPLE (MISCELLANEOUS) ×7 IMPLANT
CLIP LIGATING HEMO LOK XL GOLD (MISCELLANEOUS) ×2 IMPLANT
CLOTH BEACON ORANGE TIMEOUT ST (SAFETY) ×3 IMPLANT
CONT SPECI 4OZ STER CLIK (MISCELLANEOUS) ×2 IMPLANT
COVER SURGICAL LIGHT HANDLE (MISCELLANEOUS) ×3 IMPLANT
COVER TIP SHEARS 8 DVNC (MISCELLANEOUS) ×2 IMPLANT
COVER TIP SHEARS 8MM DA VINCI (MISCELLANEOUS) ×1
CUTTER ECHEON FLEX ENDO 45 340 (ENDOMECHANICALS) ×3 IMPLANT
DECANTER SPIKE VIAL GLASS SM (MISCELLANEOUS) ×3 IMPLANT
DERMABOND ADVANCED (GAUZE/BANDAGES/DRESSINGS) ×1
DERMABOND ADVANCED .7 DNX12 (GAUZE/BANDAGES/DRESSINGS) ×2 IMPLANT
DRAPE SURG IRRIG POUCH 19X23 (DRAPES) ×3 IMPLANT
DRSG TEGADERM 2-3/8X2-3/4 SM (GAUZE/BANDAGES/DRESSINGS) ×12 IMPLANT
DRSG TEGADERM 4X4.75 (GAUZE/BANDAGES/DRESSINGS) ×6 IMPLANT
DRSG TEGADERM 6X8 (GAUZE/BANDAGES/DRESSINGS) ×6 IMPLANT
ELECT REM PT RETURN 9FT ADLT (ELECTROSURGICAL) ×3
ELECTRODE REM PT RTRN 9FT ADLT (ELECTROSURGICAL) ×2 IMPLANT
GAUZE SPONGE 2X2 8PLY STRL LF (GAUZE/BANDAGES/DRESSINGS) ×2 IMPLANT
GLOVE BIO SURGEON STRL SZ 6.5 (GLOVE) ×3 IMPLANT
GLOVE BIOGEL M STRL SZ7.5 (GLOVE) ×9 IMPLANT
GOWN STRL REUS W/TWL LRG LVL3 (GOWN DISPOSABLE) ×6 IMPLANT
GOWN STRL REUS W/TWL XL LVL3 (GOWN DISPOSABLE) ×6 IMPLANT
HOLDER FOLEY CATH W/STRAP (MISCELLANEOUS) ×3 IMPLANT
IV LACTATED RINGERS 1000ML (IV SOLUTION) ×2 IMPLANT
KIT ACCESSORY DA VINCI DISP (KITS) ×1
KIT ACCESSORY DVNC DISP (KITS) ×2 IMPLANT
KIT PROCEDURE DA VINCI SI (MISCELLANEOUS) ×1
KIT PROCEDURE DVNC SI (MISCELLANEOUS) ×2 IMPLANT
NDL INSUFFLATION 14GA 120MM (NEEDLE) ×2 IMPLANT
NEEDLE INSUFFLATION 14GA 120MM (NEEDLE) ×3 IMPLANT
NEEDLE SPNL 22GX7 SPINOC (NEEDLE) ×3 IMPLANT
PACK ROBOT UROLOGY CUSTOM (CUSTOM PROCEDURE TRAY) ×3 IMPLANT
RELOAD GREEN ECHELON 45 (STAPLE) ×3 IMPLANT
SET TUBE IRRIG SUCTION NO TIP (IRRIGATION / IRRIGATOR) ×3 IMPLANT
SOLUTION ELECTROLUBE (MISCELLANEOUS) ×3 IMPLANT
SPONGE GAUZE 2X2 STER 10/PKG (GAUZE/BANDAGES/DRESSINGS) ×1
SPONGE LAP 4X18 X RAY DECT (DISPOSABLE) ×3 IMPLANT
SUT ETHILON 3 0 PS 1 (SUTURE) ×3 IMPLANT
SUT MNCRL AB 4-0 PS2 18 (SUTURE) ×6 IMPLANT
SUT PDS AB 0 CT1 36 (SUTURE) ×1 IMPLANT
SUT PDS AB 1 CT1 27 (SUTURE) ×5 IMPLANT
SUT VICRYL 0 UR6 27IN ABS (SUTURE) ×3 IMPLANT
SUT VLOC BARB 180 ABS3/0GR12 (SUTURE)
SUTURE VLOC BRB 180 ABS3/0GR12 (SUTURE) IMPLANT
SYR 27GX1/2 1ML LL SAFETY (SYRINGE) ×3 IMPLANT
TOWEL OR NON WOVEN STRL DISP B (DISPOSABLE) ×3 IMPLANT
TROCAR 12M 150ML BLUNT (TROCAR) ×3 IMPLANT
WATER STERILE IRR 1500ML POUR (IV SOLUTION) ×6 IMPLANT

## 2013-08-18 NOTE — Anesthesia Preprocedure Evaluation (Addendum)
Anesthesia Evaluation  Patient identified by MRN, date of birth, ID band Patient awake    Reviewed: Allergy & Precautions, H&P , NPO status , Patient's Chart, lab work & pertinent test results  Airway Mallampati: III TM Distance: >3 FB Neck ROM: Full    Dental  (+) Edentulous Upper, Edentulous Lower, Lower Dentures, Upper Dentures   Pulmonary asthma , pneumonia -, resolved, former smoker,  breath sounds clear to auscultation  Pulmonary exam normal       Cardiovascular hypertension, Pt. on medications Rhythm:Regular Rate:Normal  Hx of pericarditis 2010   Neuro/Psych  Headaches, PSYCHIATRIC DISORDERS negative psych ROS   GI/Hepatic Neg liver ROS, PUD, GERD-  ,  Endo/Other  Morbid obesity  Renal/GU Renal InsufficiencyRenal disease  negative genitourinary   Musculoskeletal negative musculoskeletal ROS (+)   Abdominal   Peds negative pediatric ROS (+)  Hematology  (+) Blood dyscrasia, anemia , HIV,   Anesthesia Other Findings   Reproductive/Obstetrics negative OB ROS                          Anesthesia Physical  Anesthesia Plan  ASA: III  Anesthesia Plan: General   Post-op Pain Management:    Induction: Intravenous  Airway Management Planned: Oral ETT  Additional Equipment:   Intra-op Plan:   Post-operative Plan: Extubation in OR  Informed Consent: I have reviewed the patients History and Physical, chart, labs and discussed the procedure including the risks, benefits and alternatives for the proposed anesthesia with the patient or authorized representative who has indicated his/her understanding and acceptance.   Dental advisory given  Plan Discussed with: CRNA  Anesthesia Plan Comments:         Anesthesia Quick Evaluation

## 2013-08-18 NOTE — Progress Notes (Signed)
Utilization review completed.  

## 2013-08-18 NOTE — Progress Notes (Signed)
Nutrition Brief Note  Patient identified on the Malnutrition Screening Tool (MST) Report  Wt Readings from Last 15 Encounters:  08/18/13 209 lb (94.802 kg)  08/18/13 209 lb (94.802 kg)  08/11/13 209 lb (94.802 kg)  07/29/13 201 lb (91.173 kg)  06/13/13 211 lb 11.2 oz (96.026 kg)  05/31/13 206 lb 8 oz (93.668 kg)  05/20/13 212 lb (96.163 kg)  04/25/13 206 lb (93.441 kg)  04/25/13 206 lb (93.441 kg)  04/17/13 206 lb (93.441 kg)  04/14/13 206 lb 12.8 oz (93.804 kg)  02/28/13 206 lb (93.441 kg)  02/24/13 206 lb 9.6 oz (93.713 kg)  02/18/13 207 lb 12 oz (94.235 kg)  01/08/13 209 lb (94.802 kg)    Body mass index is 30.85 kg/(m^2). Patient meets criteria for Obesity I based on current BMI.   Current diet order is Clear liquids, patient is consuming approximately 100% of meals at this time. Labs and medications reviewed.   Pt denied any changes in weight or appetite pta. Wife reported they prepare most of foods at home and have been trying to incorporate fresh fruits and vegetables into their diet. Will eat fast food occasionally, but not regular basis  No nutrition interventions warranted at this time. If nutrition issues arise, please consult RD.   Atlee Abide MS RD LDN Clinical Dietitian [phone]

## 2013-08-18 NOTE — Anesthesia Postprocedure Evaluation (Signed)
Anesthesia Post Note  Patient: Casey Brennan  Procedure(s) Performed: Procedure(s) (LRB): ROBOTIC ASSISTED LAPAROSCOPIC RADICAL PROSTATECTOMY (N/A) LYMPHADENECTOMY "PELVIC LYMPH NODE DISSECTION" (Bilateral)  Anesthesia type: General  Patient location: PACU  Post pain: Pain level controlled  Post assessment: Post-op Vital signs reviewed  Last Vitals: BP 148/98  Pulse 72  Temp(Src) 36.4 C (Oral)  Resp 12  SpO2 100%  Post vital signs: Reviewed  Level of consciousness: sedated  Complications: No apparent anesthesia complications

## 2013-08-18 NOTE — Brief Op Note (Signed)
08/18/2013  10:21 AM  PATIENT:  Casey Brennan  53 y.o. male  PRE-OPERATIVE DIAGNOSIS:  PROSTATE CANCER  POST-OPERATIVE DIAGNOSIS:  PROSTATE CANCER  PROCEDURE:  Procedure(s): ROBOTIC ASSISTED LAPAROSCOPIC RADICAL PROSTATECTOMY (N/A) LYMPHADENECTOMY "PELVIC LYMPH NODE DISSECTION" (Bilateral)  SURGEON:  Surgeon(s) and Role:    * Alexis Frock, MD - Primary  PHYSICIAN ASSISTANT:   ASSISTANTS: Felipa Furnace, PA   ANESTHESIA:   general  EBL:  Total I/O In: 0  Out: 250 [Urine:100; Blood:150]  BLOOD ADMINISTERED:none  DRAINS: JP to bulb suction, Foley to straight drain   LOCAL MEDICATIONS USED:  MARCAINE     SPECIMEN:  Source of Specimen:  1 - Bilateral Pelvic Lymph Nodes, 2 - Radical Prostatectomy  DISPOSITION OF SPECIMEN:  PATHOLOGY  COUNTS:  YES  TOURNIQUET:  * No tourniquets in log *  DICTATION: .Other Dictation: Dictation Number [id number]  PLAN OF CARE: Admit to inpatient   PATIENT DISPOSITION:  PACU - hemodynamically stable.   Delay start of Pharmacological VTE agent (>24hrs) due to surgical blood loss or risk of bleeding: yes

## 2013-08-18 NOTE — Transfer of Care (Signed)
Immediate Anesthesia Transfer of Care Note  Patient: Casey Brennan  Procedure(s) Performed: Procedure(s): ROBOTIC ASSISTED LAPAROSCOPIC RADICAL PROSTATECTOMY (N/A) LYMPHADENECTOMY "PELVIC LYMPH NODE DISSECTION" (Bilateral)  Patient Location: PACU  Anesthesia Type:General  Level of Consciousness: sedated  Airway & Oxygen Therapy: Patient Spontanous Breathing and Patient connected to face mask oxygen  Post-op Assessment: Report given to PACU RN and Post -op Vital signs reviewed and stable  Post vital signs: stable  Complications: No apparent anesthesia complications

## 2013-08-18 NOTE — H&P (Signed)
Casey Brennan is an 53 y.o. male.    Chief Complaint: Pre-OP Prostatectomy  HPI:   1 - Gross Hematuria, h/o Genital Piercing and "Sounding"- Pt with several episdoes gross hematuria with clots on/ off since 07/2012. Has had extensive eval wtih CT, cysto which revealed small bilateral renal cysts only. He has HIV as well as h/o penile piercing (glans through pendolous urethra) and practices occasional "sounding" for sexual arousal wich involves placement of long rods into penis. Had small incidental lucent lesion inpelvic bone by CT that was biopsy negative for malignancy per report. Operative cysto / biopsies negative for urothelial malignancy 04/2013.  2 - Bilateral Renal Cysts - Pt with bilteral minimally complex renal cysts by CT 2014, largest <3cm. A right sided mid pole cyst apppeared protinaceous / hemorragic by prior imaging.  3 - Low Risk Prostate Cancer - Pt wtih 5 cores Gleason 6 adenocarcinoma up to 70% of core (RAL,LML,LMM,LAL,LMM) by biopsy 04/2013 on w/u PSA 4.82. Several uncles with prostate cancer. He has opinon from radiation oncology and is leanign towards prostatectomy. He has family member at [facility] and would like opinion there as well and is pending for next week with Dr. Brendia Sacks.    PMH sig for HIV (since 1980's under excellent control), C-spine FX/trauma. No CV disease.   Today Casey Brennan is seen to proceed with robotic radical prostatectomy.   Past Medical History  Diagnosis Date  . HIV (human immunodeficiency virus infection) 1988  . Chronic anemia     normocytic  . Chronic folliculitis   . Gross hematuria   . Elevated PSA   . H/O pericarditis     2010--  myopercarditis--  resolved  . History of concussion     2012  &  2013  RESIDUAL HA'S --  RESOLVED  . Axillary lymphadenopathy   . History of MRSA infection 2010    infected boil  . History of gastric ulcer   . CKD (chronic kidney disease), stage II     nephrologist-  dr patel (secondary to hiv vs htn)  .  Bilateral renal cysts   . Lytic bone lesion of hip     WORK-UP DONE BY ONCOLOGIST DR HA --  NOT MALIGNANT  . Wears glasses   . Headache(784.0)     HX SEVERE FRONTAL HA'S  . Prostate cancer 04/25/13    gleason 3+3=6, volume 30 gm  . Ulcer     hx of gastric  . Asthma     very rare  . Post concussion syndrome   . Arthritis     r knee   . Pneumonia     hx of    Past Surgical History  Procedure Laterality Date  . Upper leg soft tissue biopsy  2012    left thigh  . Knee arthroscopy Right 1985  . Colonoscopy  12/26/2011    Procedure: COLONOSCOPY;  Surgeon: Lear Ng, MD;  Location: [facility];  Service: Endoscopy;  Laterality: N/A;  . Esophagogastroduodenoscopy  12/26/2011    Procedure: ESOPHAGOGASTRODUODENOSCOPY (EGD);  Surgeon: Lear Ng, MD;  Location: [facility];  Service: Endoscopy;  Laterality: N/A;  . Cardiac catheterization  03-23-2009  DR COOPER    NORMAL CORONARY ARTERIES  . Excisional bx left breast mass/  i  &  d left breast abscess  03-24-2009  . Excision chronic left breast abscess  09-21-2010  . Cystoscopy/retrograde/ureteroscopy Bilateral 04/25/2013    Procedure: CYSTOSCOPY/ BILATERAL RETROGRADES; BLADDER BIOPSIES;  Surgeon: Alexis Frock, MD;  Location: [facility];  Service: Urology;  Laterality: Bilateral;  . Prostate biopsy N/A 04/25/2013    Procedure: BIOPSY TRANSRECTAL ULTRASONIC PROSTATE (TUBP);  Surgeon: Alexis Frock, MD;  Location: [facility];  Service: Urology;  Laterality: N/A;  . Left hip biopsy  10/2012  . Left axilla biopsy  04/2013    Family History  Problem Relation Age of Onset  . Stroke Father   . Diabetes Father   . Cancer Father     brain cancer  . Hypertension Sister   . Cancer Maternal Uncle     prostate cancer   Social History:  reports that he quit smoking about 3 years ago. His smoking use included Cigars and Cigarettes. He smoked 1.50 packs per day for 0 years. He has never used  smokeless tobacco. He reports that he does not drink alcohol or use illicit drugs.  Allergies:  Allergies  Allergen Reactions  . Penicillins Shortness Of Breath and Rash  . Sulfonamide Derivatives Shortness Of Breath and Rash  . Morphine And Related     Itching, shortness of breath, chest pain    Medications Prior to Admission  Medication Sig Dispense Refill  . beclomethasone (QVAR) 80 MCG/ACT inhaler Inhale 1 puff into the lungs 2 (two) times daily.      . Darunavir Ethanolate (PREZISTA) 800 MG tablet Take 800 mg by mouth at bedtime. Take with Norvir and Truvada.      [name] emtricitabine-tenofovir (TRUVADA) 200-300 MG per tablet Take 1 tablet by mouth at bedtime.       . gabapentin (NEURONTIN) 400 MG capsule Take 400 mg by mouth 4 (four) times daily as needed (pain).      [name] ipratropium (ATROVENT) 0.02 % nebulizer solution Take 0.5 mg by nebulization 3 (three) times daily as needed for wheezing.       [name] l-methylfolate-B6-B12 (METANX) 3-35-2 MG TABS Take 1 tablet by mouth daily.      [name] levalbuterol (XOPENEX HFA) 45 MCG/ACT inhaler Inhale 1-2 puffs into the lungs every 4 (four) hours as needed for wheezing.  1 Inhaler  12  . levalbuterol (XOPENEX) 0.63 MG/3ML nebulizer solution Take 1 ampule by nebulization 3 (three) times daily as needed for wheezing.      [name] loratadine (CLARITIN) 10 MG tablet Take 10 mg by mouth daily as needed for allergies.       [name] omeprazole (PRILOSEC) 40 MG capsule Take 40 mg by mouth at bedtime.       . ondansetron (ZOFRAN) 4 MG tablet Take 1 tablet (4 mg total) by mouth every 8 (eight) hours as needed for nausea.  30 tablet  3  . raltegravir (ISENTRESS) 400 MG tablet Take 800 mg by mouth at bedtime.       . ritonavir (NORVIR) 100 MG capsule Take 100 mg by mouth at bedtime. Take with Prezista and Truvada.      . tizanidine (ZANAFLEX) 6 MG capsule Take 1 capsule (6 mg total) by mouth 3 (three) times daily as needed for muscle spasms.  90 capsule  3  . traZODone (DESYREL) 50  MG tablet Take 50 mg by mouth at bedtime.      [name] zolpidem (AMBIEN CR) 12.5 MG CR tablet Take 1 tablet (12.5 mg total) by mouth at bedtime as needed. For sleep  30 tablet  0    No results found for this or any previous visit (from the past 48 hour(s)). No results found.  Review of Systems  Constitutional:  Negative.  Negative for fever and chills.  HENT: Negative.   Eyes: Negative.   Respiratory: Negative.   Cardiovascular: Negative.  Negative for PND.  Gastrointestinal: Negative.   Genitourinary: Negative.   Musculoskeletal: Negative.   Skin: Negative.   Neurological: Negative.   Endo/Heme/Allergies: Negative.   Psychiatric/Behavioral: Negative.     Blood pressure 152/88, pulse 100, temperature 97.7 F (36.5 C), temperature source Oral, resp. rate 18, SpO2 97.00%. Physical Exam  Constitutional: He is oriented to person, place, and time. He appears well-developed and well-nourished.  HENT:  Head: Atraumatic.  Eyes: Pupils are equal, round, and reactive to light.  Neck: Normal range of motion. Neck supple.  Cardiovascular: Normal rate and regular rhythm.   Respiratory: Effort normal and breath sounds normal.  GI: Soft. Bowel sounds are normal.  Genitourinary: Penis normal.  Prior distal glans piercing site c/d/i.  Musculoskeletal: Normal range of motion.  Neurological: He is alert and oriented to person, place, and time.  Skin: Skin is warm and dry.  Psychiatric: He has a normal mood and affect. His behavior is normal. Judgment and thought content normal.     Assessment/Plan   1 - Gross Hematuria, h/o Genital Piercing, and "sounding" - Likely from GU sounding / minor trauma by history and operative endoscopic exam. I explicitly restated that  After prostatectomy he would place his continence in potential jeopardy by continuing this, and he would also have loss of prostate sensation.   2 - Bilateral Renal Cysts - relatively non-complex and felt to be unlikely source of gross  hematuria. he has very good understanding of these.   3 - Low Risk Prostate Cancer -  Multifocal and large volume. MS[facility] nomogram predicts only 7% chance indolent. He has been very well informed abotu the disease and mangment options by myself as well as Dr. Valere Dross and MD's at [facility].   He has chosen to proceed with prostatectomy today as planned.  We rediscussed prostatectomy and specifically robotic prostatectomy with bilateral pelvic lymphadenectomy being the technique that I most commonly perform. I showed the patient on their abdomen the approximately 6 small incision (trocar) sites as well as presumed extraction sites with robotic approach as well as possible open incision sites should open conversion be necessary. We rediscussed peri-operative risks including bleeding, infection, deep vein thrombosis, pulmonary embolism, compartment syndrome, nuropathy / neuropraxia, heart attack, stroke, death, as well as long-term risks such as non-cure / need for additional therapy. We specificallyre addressed that the procedure would compromise urinary control leading to stress incontinence which typically resolves with time and pelvic rehabilitation (Kegel's, etc..), but can sometimes be permanent and require additional therapy including surgery. We also specificallyre addressed sexual sequellae including significant erectile dysfunction which typically partially resolves with time but can also be permanent and require additional therapy including surgery.   We rediscussed the typical hospital course including usual 1-2 night hospitalization, discharge with foley catheter in place usually for 1-2 weeks before voiding trial as well as usually 2 week recovery until able to perform most non-strenuous activity and 6 weeks until able to return to most jobs and more strenuous activity such as exercise.   Taegen Delker 08/18/2013, 6:28 AM

## 2013-08-18 NOTE — Discharge Instructions (Signed)
1. Activity:  You are encouraged to ambulate frequently (about every hour during waking hours) to help prevent blood clots from forming in your legs or lungs.  However, you should not engage in any heavy lifting (> 10-15 lbs), strenuous activity, or straining. °2. Diet: You should continue a clear liquid diet until passing gas from below.  Once this occurs, you may advance your diet to a soft diet that would be easy to digest (i.e soups, scrambled eggs, mashed potatoes, etc.) for 24 hours just as you would if getting over a bad stomach flu.  If tolerating this diet well for 24 hours, you may then begin eating regular food.  It will be normal to have some amount of bloating, nausea, and abdominal discomfort intermittently. °3. Prescriptions:  You will be provided a prescription for pain medication to take as needed.  If your pain is not severe enough to require the prescription pain medication, you may take extra strength Tylenol instead.  You should also take an over the counter stool softener (Colace 100 mg twice daily) to avoid straining with bowel movements as the pain medication may constipate you. Finally, you will also be provided a prescription for an antibiotic to begin the day prior to your return visit in the office for catheter removal. °4. Catheter care: You will be taught how to take care of the catheter by the nursing staff prior to discharge from the hospital.  You may use both a leg bag and the larger bedside bag but it is recommended to at least use the bigger bedside bag at nighttime as the leg bag is small and will fill up overnight and also does not drain as well when lying flat. You may periodically feel a strong urge to void with the catheter in place.  This is a bladder spasm and most often can occur when having a bowel movement or when you are moving around. It is typically self-limited and usually will stop after a few minutes.  You may use some Vaseline or Neosporin around the tip of the  catheter to reduce friction at the tip of the penis. °5. Incisions: You may remove your dressing bandages the 2nd day after surgery.  You most likely will have a few small staples in each of the incisions and once the bandages are removed, the incisions may stay open to air.  You may start showering (not soaking or bathing in water) 48 hours after surgery and the incisions simply need to be patted dry after the shower.  No additional care is needed. °6. What to call us about: You should call the office ([phone]) if you develop fever > 101, persistent vomiting, or the catheter stops draining. Also, feel free to call with any other questions you may have and remember the handout that was provided to you as a reference preoperatively which answers many of the common questions that arise after surgery. ° ° °

## 2013-08-19 LAB — BASIC METABOLIC PANEL
BUN: 8 mg/dL (ref 6–23)
CO2: 23 mEq/L (ref 19–32)
Calcium: 8.5 mg/dL (ref 8.4–10.5)
Chloride: 100 mEq/L (ref 96–112)
Creatinine, Ser: 1.11 mg/dL (ref 0.50–1.35)
GFR calc Af Amer: 88 mL/min — ABNORMAL LOW (ref >90)
GFR calc non Af Amer: 76 mL/min — ABNORMAL LOW (ref >90)
Glucose, Bld: 170 mg/dL — ABNORMAL HIGH (ref 70–99)
Potassium: 4.8 mEq/L (ref 3.7–5.3)
Sodium: 134 mEq/L — ABNORMAL LOW (ref 137–147)

## 2013-08-19 LAB — HEMOGLOBIN AND HEMATOCRIT, BLOOD
HCT: 27.7 % — ABNORMAL LOW (ref 39.0–52.0)
Hemoglobin: 8.9 g/dL — ABNORMAL LOW (ref 13.0–17.0)

## 2013-08-19 LAB — CREATININE, FLUID (PLEURAL, PERITONEAL, JP DRAINAGE): Creat, Fluid: 1.2 mg/dL

## 2013-08-19 NOTE — Care Management Note (Signed)
    Page 1 of 1   08/19/2013     4:14:16 PM   CARE MANAGEMENT NOTE 08/19/2013  Patient:  Casey Brennan, Casey Brennan   Account Number:  [id number]  Date Initiated:  08/19/2013  Documentation initiated by:  Dessa Phi  Subjective/Objective Assessment:   53 Y/O M ADMITTED W/PROSTATE CA.     Action/Plan:   FROM HOME.HAS PCP,PHARMACY.   Anticipated DC Date:  08/20/2013   Anticipated DC Plan:  [place]  CM consult      Choice offered to / List presented to:             Status of service:  In process, will continue to follow Medicare Important Message given?   (If response is "NO", the following Medicare IM given date fields will be blank) Date Medicare IM given:   Date Additional Medicare IM given:    Discharge Disposition:    Per UR Regulation:  Reviewed for med. necessity/level of care/duration of stay  If discussed at [place] of Stay Meetings, dates discussed:    Comments:  08/19/13 Gennavieve Huq RN,BSN NCM [phone] POD#1 [place].NO ANTICIPATED D/C NEEDS.

## 2013-08-19 NOTE — Op Note (Signed)
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  PROCEDURE: 1. Robotic-assisted laparoscopic radical prostatectomy. 2. Bilateral pelvic lymphadenectomy.  ESTIMATED BLOOD LOSS:  150 mL.  COMPLICATIONS:  None.  ASSISTANT: Felipa Furnace, PA  SPECIMENS: 1. Right external iliac lymph nodes. 2. Right obturator lymph nodes. 3. Left external iliac lymph nodes. 4. Left obturator lymph nodes. 5. Periprostatic fat. 6. Radical prostatectomy.  DRAINS: 1. Jackson-Pratt drain to bulb suction. 2. Foley catheter straight drain.  INDICATION:  Casey Brennan is a 53 year old gentleman with history of intervening hematuria.  He was found on workup of this to have elevated PSA to 4.8.  He underwent 12-core prostate biopsy, which revealed multifocal Gleason 6 adenocarcinoma including bilateral apical disease. Nomogram predicted a less than 7% chance of indolent tumor.  Options were discussed including surveillance versus radiotherapy versus surgical extirpation.  He also had multiple second opinion including by Radiation/Oncology and also by Head and Neck Surgeon and a tertiary university and he wished to proceed with robotic radical prostatectomy. Informed consent was obtained and placed in medical record.  We also discussed explicitly implications of his history of urethral sounding and how if he continue this activity, it could greatly compromise his recovery from this.  PROCEDURE IN DETAIL:  The patient being, Casey Brennan, verified. Procedure being robotic prostatectomy was confirmed.  Procedure was carried out.  Time-out was performed.  Intravenous  antibiotics were administered.  General endotracheal anesthesia was introduced.  The patient was placed in a low lithotomy position.  Sterile field was created by prepping and draping the patient's penis, perineum, and proximal thighs using iodine and his infra-xiphoid, abdomen using chlorhexidine gluconate.  Sequential compression devices were applied. He was further fashioned on the operative table using 3-inch tape over padding across his chest.  His arms were tucked and suitably padded.  A test of steep Trendelenburg position was performed.  He was found to be adequately positioned.  Next, a high-flow low pressure pneumoperitoneum was obtained using Veress technique in the infraumbilical midline having passed the aspiration and drop test.  A 12-mm robotic camera port was then placed in the same location.  Laparoscopic examination of peritoneal cavity revealed no significant adhesions, no visceral injury. Additional ports were then placed as follows; right paramedian 8-mm robotic port, left paramedian 8-mm robotic port, left far lateral 8-mm robotic port, 12-mm right far lateral assist port, and right paramedian 5-mm assist port.  Robot was docked and passed through electronic checks and attention was directed at development of the space of Retzius.  Incision was made lateral to the right medial umbilical ligament from the umbilicus towards the area of the internal ring and coursing along the iliac vessels.  The right vas was identified and ligated and uses a gentle bucket-handle for traction medially.  This exposed the area of the right pelvic  lymph nodes and right pelvic lymphadenectomy was performed.  All fiber fatty tissue that confines the right external iliac artery, iliac vein, pelvic side wall were carefully dissected. Lymphostasis was achieved using cold clips and this was set aside as right external iliac lymph nodes.  Right obturator groove was similarly  dissected. Hemostasis was achieved using cold clips.  The boundaries being the external iliac vein, obturator nerve, and pelvic side wall.  The obturator nerve was inspected following these maneuvers and found to be grossly uninjured.  Mirror images was performed on the left side thus releasing the left medial umbilical ligament and exposing the left pelvic lymph nodes.  Similarly, left lymphadenectomy was performed at the external iliac and obturator groups with lymphostasis achieved cold clips and boundaries as per above.  The obturator nerve was inspected and found to be intact.  Anterior attachments were taken down.  This completely exposed the anterior base of the prostate. Preprosthetic fat was then released thus exposing the area of the bladder neck prostate junction more easily and this fat was set aside for permanent pathology.  The endopelvic fascia was carefully swept away from the lateral aspect of the prostate from the base where the apex was exposed the dorsal venous complex which was controlled using endovascular stapler.  Next, the bladder neck was identified moving a Foley catheter back and forth.  An incision was made in anterior- posterior direction separating the bladder neck from the anterior base of the prostate towards the posterior base of the prostate.  Next, incision was made approximately 7 mm inferior, posterior to the posterior lip of the base of the prostate to enter the plane Dennonviller and this was developed.  The bilateral vas deferens were encountered, dissected for distance of 4 cm, placed on gentle superior traction.  The bilateral seminal vesicles were carefully dissected to the tip and placed on gentle superior traction.  The plane of the Dennonvillier was further developed and the base of the apex orientation thus exposed the bilateral pedicles.  First on the left side, the pedicles were controlled using combination of co-clipping and bipolar  energy in the area to resume neurovascular bundle.  There appeared to be no obvious extracapsular disease.  Therefore, aggressive nerve sparing was performed on the left side sweeping the neurovascular pedicles away from the lateral apical aspect of the prostate.  A mirror image dissection was performed on the right side thus providing control of the vascular pedicles and performing aggressive nerve sparing on the right side as well.  Next, apical dissection was performed by ablation of the prostate in gentle superior traction.  The cold scissors were used to transect the membranous urethra and a posterior urethral plate was completely freed up.  The radical prostatectomy specimen was placed into an EndoCatch bag for later retrieval.  Digital rectal exam was performed using indicator glove and no evidence of rectal violation was seen. Next, posterior reconstruction was performed using running V-Loc suture bringing the posterior urethral plate and tension-free apposition to the posterior bladder neck.  Next, mucosa-to-mucosa anastomosis was performed using double-armed V-Loc suture from 6 o'clock to the 12 o'clock position with the 12 o'clock stitch also being anchored to the previously ligated dorsal venous complex and puboprostatic ligaments to provide anterior suspension.  New Foley catheter was placed and irrigated quantitatively without obvious leak.  Closed suction drain was brought through the previous left lateral most robotic port site.  The right system port site was closed with fascia using a  Carter-Thomason suture passer.  Robot was undocked.  Specimens were retrieved by extending the previous camera port site for total distance approximately 4 cm.  Extraction site was closed with the fascia using figure-of-eight PDS x3, followed by reapproximation of Scarpa's using Vicryl.  All skin incisions were infiltrated with lipolyzed Marcaine and closed the skin using subcuticular  Monocryl followed by Dermabond.  Procedure was then terminated.  The patient tolerated the procedure well.  There were no immediate periprocedural complications.  The patient was taken to postanesthesia care unit in stable condition.          ______________________________ Alexis Frock, MD     TM/MEDQ  D:  08/18/2013  T:  08/18/2013  Job:  [id number]

## 2013-08-19 NOTE — Op Note (Deleted)
NAME:  Casey Brennan, Casey Brennan NO.:  [id number]  MEDICAL RECORD NO.:  [id number]  LOCATION:  PADM                         FACILITY:  [facility]  PHYSICIAN:  Alexis Frock, MD     DATE OF BIRTH:  [birth year]  DATE OF PROCEDURE: DATE OF DISCHARGE:  08/20/2013                              OPERATIVE REPORT   DIAGNOSIS:  Low-risk moderate-volume prostate cancer.  PROCEDURE: 1. Robotic-assisted laparoscopic radical prostatectomy. 2. Bilateral pelvic lymphadenectomy.  ESTIMATED BLOOD LOSS:  150 mL.  COMPLICATIONS:  None.  SPECIMENS: 1. Right external iliac lymph nodes. 2. Right obturator lymph nodes. 3. Left external iliac lymph nodes. 4. Left obturator lymph nodes. 5. Periprostatic fat. 6. Radical prostatectomy.  DRAINS: 1. Jackson-Pratt drain to bulb suction. 2. Foley catheter straight drain.  INDICATION:  Mr. Glotfelty is a 53 year old gentleman with history of intervening hematuria.  He was found on workup of this to have elevated PSA to 4.8.  He underwent 12-core prostate biopsy, which revealed multifocal Gleason 6 adenocarcinoma including bilateral apical disease. Nomogram predicted a less than 7% chance of indolent tumor.  Options were discussed including surveillance versus radiotherapy versus surgical extirpation.  He also had multiple second opinion including by Radiation/Oncology and also by Head and Neck Surgeon and a tertiary university and he wished to proceed with robotic radical prostatectomy. Informed consent was obtained and placed in medical record.  We also discussed explicitly implications of his history of urethral sounding and how if he continue this activity, it could greatly compromise his recovery from this.  PROCEDURE IN DETAIL:  The patient being, Casey Brennan, verified. Procedure being robotic prostatectomy was confirmed.  Procedure was carried out.  Time-out was performed.  Intravenous antibiotics were administered.  General endotracheal  anesthesia was introduced.  The patient was placed in a low lithotomy position.  Sterile field was created by prepping and draping the patient's penis, perineum, and proximal thighs using iodine and his infra-xiphoid, abdomen using chlorhexidine gluconate.  Sequential compression devices were applied. He was further fashioned on the operative table using 3-inch tape over padding across his chest.  His arms were tucked and suitably padded.  A test of steep Trendelenburg position was performed.  He was found to be adequately positioned.  Next, a high-flow low pressure pneumoperitoneum was obtained using Veress technique in the infraumbilical midline having passed the aspiration and drop test.  A 12-mm robotic camera port was then placed in the same location.  Laparoscopic examination of peritoneal cavity revealed no significant adhesions, no visceral injury. Additional ports were then placed as follows; right paramedian 8-mm robotic port, left paramedian 8-mm robotic port, left far lateral 8-mm robotic port, 12-mm right far lateral assist port, and right paramedian 5-mm assist port.  Robot was docked and passed through electronic checks and attention was directed at development of the __________.  Incision was made lateral to the right medial umbilical ligament from the umbilicus towards the area of the internal ring and coursing along the iliac vessels.  The right vas was identified and ligated and uses a gentle bucket-handle for traction medially.  This exposed the area of the right pelvic lymph nodes and right pelvic lymphadenectomy was performed.  All fiber fatty tissue that confines the right external iliac artery, iliac vein, pelvic side wall were carefully dissected. Lymphostasis was achieved using cold clips __________ right external iliac lymph nodes.  Right obturator groove was similarly dissected. Hemostasis was achieved using cold clips.  The boundaries being the external iliac  vein, obturator nerve, and pelvic side wall.  The obturator nerve was inspected following these maneuvers and found to be grossly uninjured.  Mirror images was performed on the left side thus releasing the left medial umbilical ligament and exposing the left pelvic lymph nodes.  Similarly, left lymphadenectomy was performed at the external iliac and obturator groups with lymphostasis achieved cold clips and boundaries as per above.  The obturator nerve was inspected and found to be intact.  Anterior attachments were taken down.  This completely __________ exposing the anterior base of the prostate. Preprosthetic fat was then released thus exposing the area of the bladder neck prostate junction more easily and this fat was set aside for permanent pathology.  The endopelvic fascia was carefully swept away from the lateral aspect of the prostate from the base where the apex was exposed the dorsal venous complex which was controlled using endovascular stapler.  Next, the bladder neck was identified moving a Foley catheter back and forth.  An incision was made in anterior- posterior direction separating the bladder neck from the anterior base of the prostate towards the posterior base of the prostate.  Next, incision was made approximately 7 mm inferior, posterior to the posterior lip of the base of the prostate to enter the plane __________ and this was developed.  The bilateral vas deferens were encountered, dissected for distance of 4 cm, placed on gentle superior traction.  The bilateral seminal vesicles were carefully dissected to the tip and placed on gentle superior traction.  The plane of the __________ was further developed and the base of the apex orientation thus exposed the bilateral pedicles.  First on the left side, the pedicles were controlled using combination of co-clipping and bipolar energy in the area to resume neurovascular bundle.  There appeared to be no  obvious extracapsular disease.  Therefore, aggressive nerve sparing was performed on the left side sweeping the neurovascular pedicles away from the lateral apical aspect of the prostate.  A mirror image dissection was performed on the right side thus providing control of the vascular pedicles and performing aggressive nerve sparing on the right side as well.  Next, apical dissection was performed by ablation of the prostate in gentle superior traction.  The cold scissors were used to transect the membranous urethra and a posterior urethral plate was completely freed up.  The radical prostatectomy specimen was placed into an EndoCatch bag for later retrieval.  Digital rectal exam was performed using indicator glove and no evidence of rectal violation was seen. Next, posterior reconstruction was performed using running V-Loc suture bringing the posterior urethral plate and tension-free apposition to the posterior bladder neck.  Next, mucosa-to-mucosa anastomosis was performed using double-armed V-Loc suture from 6 o'clock to the 12 o'clock position with the 12 o'clock stitch also being anchored to the previously ligated dorsal venous complex and puboprostatic ligaments to provide anterior suspension.  New Foley catheter was placed and irrigated quantitatively without obvious leak.  Closed suction drain was brought through the previous left lateral most robotic port site.  The right system port site was closed with fascia using a Carter-Thomason suture passer.  Robot was undocked.  Specimens were retrieved by extending  the previous camera port site for total distance approximately 4 cm.  Extraction site was closed with the fascia using figure-of-eight PDS x3, followed by reapproximation of Scarpa's using Vicryl.  All skin incisions were infiltrated with lipolyzed Marcaine and closed the skin using subcuticular Monocryl followed by Dermabond.  Procedure was then terminated.  The patient  tolerated the procedure well.  There were no immediate periprocedural complications.  The patient was taken to postanesthesia care unit in stable condition.          ______________________________ Alexis Frock, MD     TM/MEDQ  D:  08/18/2013  T:  08/18/2013  Job:  [id number]

## 2013-08-19 NOTE — Progress Notes (Signed)
1 Day Post-Op  Subjective:  1 - Prostate Cancer - s/p robotic radical nephrectomy with bilateral pelvic lymphadenectomy 08/18/2012 for multifocal prostate adenocarcinoma. JP Cr same as serum 2/17.  Today Casey Brennan is recovering well. Pain manageable, no emesis, ambulated some.   Objective: Vital signs in last 24 hours: Temp:  [98.3 F (36.8 C)-99.6 F (37.6 C)] 98.6 F (37 C) (02/17 0452) Pulse Rate:  [84-105] 84 (02/17 0452) Resp:  [18-20] 20 (02/17 0452) BP: (129-141)/(68-87) 129/68 mmHg (02/17 0452) SpO2:  [95 %-100 %] 95 % (02/17 0801) Last BM Date: 08/17/13  Intake/Output from previous day: 02/16 0701 - 02/17 0700 In: 2848.8 [P.O.:720; I.V.:2128.8] Out: 4950 [Urine:4620; Drains:180; Blood:150] Intake/Output this shift: Total I/O In: -  Out: 39 [Urine:1000; Drains:25]  General appearance: alert, cooperative, appears stated age and sister at bedside Head: Normocephalic, without obvious abnormality, atraumatic Eyes: PERRL Nose: Nares normal. Septum midline. Mucosa normal. No drainage or sinus tenderness. Neck: no adenopathy and supple, symmetrical, trachea midline Back: symmetric, no curvature. ROM normal. No CVA tenderness. Resp: NO labored breathing. Cardio: regular rate and rhythm, S1, S2 normal, no murmur, click, rub or gallop GI: soft, non-tender; bowel sounds normal; no masses,  no organomegaly Male genitalia: normal, foley c/d/i with copious clear yellow urine. Extremities: extremities normal, atraumatic, no cyanosis or edema Pulses: 2+ and symmetric Skin: Skin color, texture, turgor normal. No rashes or lesions Lymph nodes: Cervical, supraclavicular, and axillary nodes normal. Neurologic: Grossly normal Incision/Wound: Recent port sites and extraction sites c/d/i. No hernias. JP with serosanguinous scant output.  Lab Results:   Recent Labs  08/18/13 1050 08/19/13 0410  HGB 9.6* 8.9*  HCT 30.5* 27.7*   BMET  Recent Labs  08/19/13 0410  NA 134*  K 4.8   CL 100  CO2 23  GLUCOSE 170*  BUN 8  CREATININE 1.11  CALCIUM 8.5   PT/INR No results found for this basename: LABPROT, INR,  in the last 72 hours ABG No results found for this basename: PHART, PCO2, PO2, HCO3,  in the last 72 hours  Studies/Results: No results found.  Anti-infectives: Anti-infectives   Start     Dose/Rate Route Frequency Ordered Stop   08/18/13 1700  emtricitabine-tenofovir (TRUVADA) 200-300 MG per tablet 1 tablet  Status:  Discontinued     1 tablet Oral Daily at bedtime 08/18/13 1300 08/18/13 1330   08/18/13 1700  raltegravir (ISENTRESS) tablet 800 mg  Status:  Discontinued     800 mg Oral Daily at bedtime 08/18/13 1300 08/18/13 1330   08/18/13 1700  ritonavir (NORVIR) capsule 100 mg  Status:  Discontinued     100 mg Oral Daily at bedtime 08/18/13 1300 08/18/13 1329   08/18/13 1700  Darunavir Ethanolate (PREZISTA) tablet 800 mg  Status:  Discontinued     800 mg Oral Daily at bedtime 08/18/13 1300 08/18/13 1331   08/18/13 1700  ritonavir (NORVIR) capsule 100 mg     100 mg Oral Daily with supper 08/18/13 1329     08/18/13 1700  raltegravir (ISENTRESS) tablet 800 mg     800 mg Oral Daily with supper 08/18/13 1330     08/18/13 1700  emtricitabine-tenofovir (TRUVADA) 200-300 MG per tablet 1 tablet     1 tablet Oral Daily with supper 08/18/13 1330     08/18/13 1700  Darunavir Ethanolate (PREZISTA) tablet 800 mg     800 mg Oral Daily with supper 08/18/13 1331     08/18/13 0600  ciprofloxacin (CIPRO) IVPB 400 mg  Status:  Discontinued     400 mg 200 mL/hr over 60 Minutes Intravenous Every 12 hours 08/18/13 0533 08/18/13 1251   08/18/13 0600  clindamycin (CLEOCIN) IVPB 900 mg     900 mg 100 mL/hr over 30 Minutes Intravenous  Once 08/18/13 0533 08/18/13 0715   08/18/13 0000  ciprofloxacin (CIPRO) 500 MG tablet     500 mg Oral 2 times daily 08/18/13 1025        Assessment/Plan:  1 - Prostate Cancer - Doing well POD 1. Plan for DC tomorrow. Contiue ambulation  today.  [facility], Bunnie Rehberg 08/19/2013

## 2013-08-20 ENCOUNTER — Encounter (HOSPITAL_COMMUNITY): Payer: Self-pay | Admitting: Urology

## 2013-08-20 MED ORDER — BISACODYL 10 MG RE SUPP
10.0000 mg | Freq: Once | RECTAL | Status: AC
  Administered 2013-08-20: 10 mg via RECTAL
  Filled 2013-08-20: qty 1

## 2013-08-20 MED ORDER — CALCIUM CARBONATE ANTACID 500 MG PO CHEW
400.0000 mg | CHEWABLE_TABLET | Freq: Three times a day (TID) | ORAL | Status: DC | PRN
  Administered 2013-08-20: 400 mg via ORAL
  Filled 2013-08-20: qty 2

## 2013-08-20 NOTE — Discharge Summary (Signed)
Physician Discharge Summary  Patient ID: Casey Brennan MRN: [id number] DOB/AGE: 51-03-1961 51 y.o.  Admit date: 08/18/2013 Discharge date: 08/20/2013  Admission Diagnoses: Prostate Cancer  Discharge Diagnoses:  Prostate Cancer Active Problems:   Prostate cancer   Discharged Condition: good  Hospital Course:   1 - Prostate Cancer - s/p robotic radical nephrectomy with bilateral pelvic lymphadenectomy 08/18/2012 for multifocal prostate adenocarcinoma. JP Cr same as serum 2/17. Final path [id number] gleason 7 with negative margins. By 2/18, the day of discharge, he is ambulatory, tollerating PO, pain controlled, and felt to be adequate for discharge.    Consults: None  Significant Diagnostic Studies: labs: as per above.  Treatments: surgery: robotic radical nephrectomy with bilateral pelvic lymphadenectomy 08/18/2012 f  Discharge Exam: Blood pressure 129/76, pulse 77, temperature 98.1 F (36.7 C), temperature source Oral, resp. rate 18, height 5\' 9"  (1.753 m), weight 94.802 kg (209 lb), SpO2 95.00%. General appearance: alert, cooperative, appears stated age and sister at bedside Head: Normocephalic, without obvious abnormality, atraumatic Eyes: conjunctivae/corneas clear. PERRL, EOM's intact. Fundi benign. Nose: Nares normal. Septum midline. Mucosa normal. No drainage or sinus tenderness. Throat: lips, mucosa, and tongue normal; teeth and gums normal Back: symmetric, no curvature. ROM normal. No CVA tenderness. Resp: clear to auscultation bilaterally Cardio: regular rate and rhythm, S1, S2 normal, no murmur, click, rub or gallop GI: soft, non-tender; bowel sounds normal; no masses,  no organomegaly Male genitalia: normal, foley c/d/i with clear yellow urine Extremities: extremities normal, atraumatic, no cyanosis or edema Pulses: 2+ and symmetric Skin: Skin color, texture, turgor normal. No rashes or lesions Lymph nodes: Cervical, supraclavicular, and axillary nodes  normal. Neurologic: Grossly normal Incision/Wound: All incision sites c/d/i. JP removed and dry dressing applied.  Disposition: 01-Home or Self Care   Future Appointments Provider Department Dept Phone   08/21/2013 3:00 PM Larey Seat, MD [facility] [phone]   08/27/2013 8:00 AM [facility] [phone]   09/02/2013 1:15 PM Ivor Costa, MD [facility] [phone]       Medication List         beclomethasone 80 MCG/ACT inhaler  Commonly known as:  QVAR  Inhale 1 puff into the lungs 2 (two) times daily.     ciprofloxacin 500 MG tablet  Commonly known as:  CIPRO  Take 1 tablet (500 mg total) by mouth 2 (two) times daily. Start day prior to office visit for foley removal     Darunavir Ethanolate 800 MG tablet  Commonly known as:  PREZISTA  Take 800 mg by mouth at bedtime. Take with Norvir and Truvada.     emtricitabine-tenofovir 200-300 MG per tablet  Commonly known as:  TRUVADA  Take 1 tablet by mouth at bedtime.     gabapentin 400 MG capsule  Commonly known as:  NEURONTIN  Take 400 mg by mouth 4 (four) times daily as needed (pain).     ipratropium 0.02 % nebulizer solution  Commonly known as:  ATROVENT  Take 0.5 mg by nebulization 3 (three) times daily as needed for wheezing.     l-methylfolate-B6-B12 3-35-2 MG Tabs  Commonly known as:  METANX  Take 1 tablet by mouth daily.     levalbuterol 0.63 MG/3ML nebulizer solution  Commonly known as:  XOPENEX  Take 1 ampule by nebulization 3 (three) times daily as needed for wheezing.     levalbuterol 45 MCG/ACT inhaler  Commonly known as:  XOPENEX HFA  Inhale 1-2  puffs into the lungs every 4 (four) hours as needed for wheezing.     loratadine 10 MG tablet  Commonly known as:  CLARITIN  Take 10 mg by mouth daily as needed for allergies.     omeprazole 40 MG capsule  Commonly known as:  PRILOSEC  Take 40 mg by mouth at bedtime.      ondansetron 4 MG tablet  Commonly known as:  ZOFRAN  Take 1 tablet (4 mg total) by mouth every 8 (eight) hours as needed for nausea.     raltegravir 400 MG tablet  Commonly known as:  ISENTRESS  Take 800 mg by mouth at bedtime.     ritonavir 100 MG capsule  Commonly known as:  NORVIR  Take 100 mg by mouth at bedtime. Take with Prezista and Truvada.     tizanidine 6 MG capsule  Commonly known as:  ZANAFLEX  Take 1 capsule (6 mg total) by mouth 3 (three) times daily as needed for muscle spasms.     traMADol 50 MG tablet  Commonly known as:  ULTRAM  Take 1 tablet (50 mg total) by mouth every 4 (four) hours as needed (may take 2 tablets every 6 hours as needed for pain).     traZODone 50 MG tablet  Commonly known as:  DESYREL  Take 50 mg by mouth at bedtime.     zolpidem 12.5 MG CR tablet  Commonly known as:  AMBIEN CR  Take 1 tablet (12.5 mg total) by mouth at bedtime as needed. For sleep           Follow-up Information   Follow up with Alexis Frock, MD On 08/26/2013. (at 10:45)    Specialty:  Urology   Contact information:   [facility]  [place] [address] [phone]       Signed: Alexis Frock 08/20/2013, 9:05 AM

## 2013-08-20 NOTE — Progress Notes (Addendum)
Patient has felt nauseous since breakfast this morning.  Has had one episode of emesis, after zofran was administered.  Dr. Tresa Moore notified; orders given to administer dulcolax supposity.  Will administer and continue to monitor.

## 2013-08-20 NOTE — Progress Notes (Signed)
S:  Pt with some nausea and emesis x 1 this AM after exam.  Feeling some better throughout day, tollerating liquids, jello.  O: AFVSS NAD Mild abd distension c/w mild ileus Incisions c/d/i No c/c/e  A/P  Discussed option of remian in house for observatio another night v. DC home. He wants to go home. Ileus explained as was strong warning to call MD / go to ER if begins to have refractory emesis or high fever. Will DC.

## 2013-08-21 ENCOUNTER — Ambulatory Visit: Payer: Medicare Other | Admitting: Neurology

## 2013-08-26 ENCOUNTER — Telehealth: Payer: Self-pay | Admitting: *Deleted

## 2013-08-26 ENCOUNTER — Other Ambulatory Visit: Payer: Self-pay | Admitting: Hematology and Oncology

## 2013-08-26 NOTE — Telephone Encounter (Signed)
Informed pt of lab appt tomorrow canceled per Dr. Alvy Bimler it is not necessary.  Pt to follow w/ his Urologist and PCP.  He verbalized understanding.

## 2013-08-27 ENCOUNTER — Other Ambulatory Visit: Payer: Medicare Other

## 2013-09-02 ENCOUNTER — Encounter: Payer: Medicare Other | Admitting: Internal Medicine

## 2013-09-08 ENCOUNTER — Telehealth: Payer: Self-pay | Admitting: *Deleted

## 2013-09-08 NOTE — Telephone Encounter (Signed)
Patient's sister called, stating the patient is having night sweats again.  They are concerned because he recently had surgery for cancer, want to know if it has anything to do with that.   RN returned call, patient is seeing his urologist Dr. Tresa Moore on 3/10 (pt hx of prostate cancer, sx on 2/16).  Patient will call tomorrow if he feels he needs an appointment with Dr. [name].  RN advised that Dr. [name] has a limited schedule this month, that patient may be worked in with the clinic doctor.  Patient stated that would be fine. Landis Gandy, RN

## 2013-09-09 DIAGNOSIS — R509 Fever, unspecified: Secondary | ICD-10-CM | POA: Diagnosis not present

## 2013-09-09 DIAGNOSIS — C61 Malignant neoplasm of prostate: Secondary | ICD-10-CM | POA: Diagnosis not present

## 2013-09-09 DIAGNOSIS — Z21 Asymptomatic human immunodeficiency virus [HIV] infection status: Secondary | ICD-10-CM | POA: Diagnosis not present

## 2013-09-09 DIAGNOSIS — N393 Stress incontinence (female) (male): Secondary | ICD-10-CM | POA: Diagnosis not present

## 2013-09-16 DIAGNOSIS — C61 Malignant neoplasm of prostate: Secondary | ICD-10-CM | POA: Diagnosis not present

## 2013-09-16 DIAGNOSIS — M6281 Muscle weakness (generalized): Secondary | ICD-10-CM | POA: Diagnosis not present

## 2013-09-16 DIAGNOSIS — N393 Stress incontinence (female) (male): Secondary | ICD-10-CM | POA: Diagnosis not present

## 2013-09-16 DIAGNOSIS — R279 Unspecified lack of coordination: Secondary | ICD-10-CM | POA: Diagnosis not present

## 2013-10-07 DIAGNOSIS — C61 Malignant neoplasm of prostate: Secondary | ICD-10-CM | POA: Diagnosis not present

## 2013-10-07 DIAGNOSIS — N393 Stress incontinence (female) (male): Secondary | ICD-10-CM | POA: Diagnosis not present

## 2013-10-13 ENCOUNTER — Other Ambulatory Visit: Payer: Self-pay | Admitting: Internal Medicine

## 2013-10-13 ENCOUNTER — Other Ambulatory Visit: Payer: Medicare Other

## 2013-10-13 DIAGNOSIS — B20 Human immunodeficiency virus [HIV] disease: Secondary | ICD-10-CM

## 2013-10-13 DIAGNOSIS — N393 Stress incontinence (female) (male): Secondary | ICD-10-CM | POA: Diagnosis not present

## 2013-10-13 LAB — CBC WITH DIFFERENTIAL/PLATELET
Basophils Absolute: 0.1 10*3/uL (ref 0.0–0.1)
Basophils Relative: 1 % (ref 0–1)
Eosinophils Absolute: 0.6 10*3/uL (ref 0.0–0.7)
Eosinophils Relative: 11 % — ABNORMAL HIGH (ref 0–5)
HCT: 32.4 % — ABNORMAL LOW (ref 39.0–52.0)
Hemoglobin: 9.6 g/dL — ABNORMAL LOW (ref 13.0–17.0)
Lymphocytes Relative: 38 % (ref 12–46)
Lymphs Abs: 2.1 10*3/uL (ref 0.7–4.0)
MCH: 23.1 pg — ABNORMAL LOW (ref 26.0–34.0)
MCHC: 29.6 g/dL — ABNORMAL LOW (ref 30.0–36.0)
MCV: 78.1 fL (ref 78.0–100.0)
Monocytes Absolute: 0.4 10*3/uL (ref 0.1–1.0)
Monocytes Relative: 8 % (ref 3–12)
Neutro Abs: 2.3 10*3/uL (ref 1.7–7.7)
Neutrophils Relative %: 42 % — ABNORMAL LOW (ref 43–77)
Platelets: 271 10*3/uL (ref 150–400)
RBC: 4.15 MIL/uL — ABNORMAL LOW (ref 4.22–5.81)
RDW: 23.3 % — ABNORMAL HIGH (ref 11.5–15.5)
WBC: 5.4 10*3/uL (ref 4.0–10.5)

## 2013-10-13 LAB — COMPLETE METABOLIC PANEL WITH GFR
ALT: 9 U/L (ref 0–53)
AST: 12 U/L (ref 0–37)
Albumin: 4.2 g/dL (ref 3.5–5.2)
Alkaline Phosphatase: 110 U/L (ref 39–117)
BUN: 7 mg/dL (ref 6–23)
CO2: 27 mEq/L (ref 19–32)
Calcium: 9.5 mg/dL (ref 8.4–10.5)
Chloride: 103 mEq/L (ref 96–112)
Creat: 1.06 mg/dL (ref 0.50–1.35)
GFR, Est African American: >89 mL/min
GFR, Est Non African American: 81 mL/min
Glucose, Bld: 92 mg/dL (ref 70–99)
Potassium: 4 mEq/L (ref 3.5–5.3)
Sodium: 139 mEq/L (ref 135–145)
Total Bilirubin: 0.5 mg/dL (ref 0.2–1.2)
Total Protein: 7.3 g/dL (ref 6.0–8.3)

## 2013-10-14 LAB — T-HELPER CELL (CD4) - (RCID CLINIC ONLY)
CD4 % Helper T Cell: 22 % — ABNORMAL LOW (ref 33–55)
CD4 T Cell Abs: 430 /uL (ref 400–2700)

## 2013-10-15 ENCOUNTER — Other Ambulatory Visit: Payer: Self-pay | Admitting: Internal Medicine

## 2013-10-15 DIAGNOSIS — G4700 Insomnia, unspecified: Secondary | ICD-10-CM

## 2013-10-15 LAB — HIV-1 RNA QUANT-NO REFLEX-BLD
HIV 1 RNA Quant: <20 copies/mL (ref <20)
HIV-1 RNA Quant, Log: <1.3 {Log} (ref <1.30)

## 2013-10-22 ENCOUNTER — Ambulatory Visit: Payer: Medicare Other

## 2013-10-27 ENCOUNTER — Ambulatory Visit (INDEPENDENT_AMBULATORY_CARE_PROVIDER_SITE_OTHER): Payer: Medicare Other | Admitting: Infectious Diseases

## 2013-10-27 ENCOUNTER — Encounter: Payer: Self-pay | Admitting: Infectious Diseases

## 2013-10-27 VITALS — BP 145/92 | HR 92 | Temp 98.1°F | Wt 214.0 lb

## 2013-10-27 DIAGNOSIS — Z113 Encounter for screening for infections with a predominantly sexual mode of transmission: Secondary | ICD-10-CM

## 2013-10-27 DIAGNOSIS — Z79899 Other long term (current) drug therapy: Secondary | ICD-10-CM | POA: Diagnosis not present

## 2013-10-27 DIAGNOSIS — C61 Malignant neoplasm of prostate: Secondary | ICD-10-CM

## 2013-10-27 DIAGNOSIS — B20 Human immunodeficiency virus [HIV] disease: Secondary | ICD-10-CM | POA: Diagnosis not present

## 2013-10-27 MED ORDER — SILDENAFIL CITRATE 25 MG PO TABS: 25.0000 mg | ORAL_TABLET | Freq: Every day | ORAL | Status: DC | PRN

## 2013-10-27 MED ORDER — CYANOCOBALAMIN 1000 MCG/ML IJ SOLN
1000.0000 ug | Freq: Once | INTRAMUSCULAR | Status: AC
  Administered 2013-10-27: 1000 ug via INTRAMUSCULAR

## 2013-10-27 NOTE — Assessment & Plan Note (Signed)
He is doing well on a salvage regimen. Wants B12 shot today. vax are up to date. Will see him back in 6 months.

## 2013-10-27 NOTE — Progress Notes (Signed)
   Subjective:    Patient ID: Casey Brennan, male    DOB: 18-May-1961, 53 y.o.   MRN: [id number]  HPI 53 yo M with hx HIV+ and prostate Ca. Currently taking ISN/TRV/DRVr.  Had radical prostatectomy 08-18-13, adenoCA, LN (-), margins (-). Has no trouble with passing his urine. Still having some pain with his passing his bowels. Nocturia. Having more night sweats after surgery (having to change shirts). Has decreased since last weekend. No chemotherapeutics. Lamonte Sakai gotten viagra rx but is too expensive.   HIV 1 RNA Quant (copies/mL)  Date Value  10/13/2013 <20   05/21/2013 <20   11/18/2012 105*     CD4 T Cell Abs (/uL)  Date Value  10/13/2013 430   05/21/2013 330*  11/18/2012 500     Review of Systems  Constitutional: Negative for appetite change and unexpected weight change.  Gastrointestinal: Negative for diarrhea and constipation.  Genitourinary: Negative for dysuria.       Objective:   Physical Exam  Constitutional: He appears well-developed and well-nourished.  HENT:  Mouth/Throat: No oropharyngeal exudate.  Eyes: EOM are normal. Pupils are equal, round, and reactive to light.  Neck: Neck supple.  Cardiovascular: Normal rate, regular rhythm and normal heart sounds.   Pulmonary/Chest: Effort normal and breath sounds normal.  Abdominal: Soft. Bowel sounds are normal. He exhibits no distension. There is no tenderness.  Musculoskeletal: He exhibits edema.  Lymphadenopathy:    He has no cervical adenopathy.          Assessment & Plan:

## 2013-10-27 NOTE — Addendum Note (Signed)
Addended by: Tip Atienza C on: 10/27/2013 10:28 AM   Modules accepted: Orders

## 2013-10-27 NOTE — Addendum Note (Signed)
Addended by: Myrtis Hopping A on: 10/27/2013 12:11 PM   Modules accepted: Orders

## 2013-10-27 NOTE — Assessment & Plan Note (Addendum)
Pt has concerns about ED, i have asked him to f/u with Urology. He has questions about hormonal changes after prostatectomy, I also asked him to f/u with Urology regarding this as well.

## 2013-11-12 ENCOUNTER — Other Ambulatory Visit: Payer: Self-pay | Admitting: Neurology

## 2013-11-16 ENCOUNTER — Other Ambulatory Visit: Payer: Self-pay | Admitting: Internal Medicine

## 2013-11-16 DIAGNOSIS — G4700 Insomnia, unspecified: Secondary | ICD-10-CM

## 2013-11-17 ENCOUNTER — Other Ambulatory Visit: Payer: Self-pay | Admitting: Internal Medicine

## 2013-12-18 ENCOUNTER — Other Ambulatory Visit: Payer: Self-pay | Admitting: Neurology

## 2013-12-18 ENCOUNTER — Other Ambulatory Visit: Payer: Self-pay | Admitting: Internal Medicine

## 2014-01-01 IMAGING — CR DG CHEST 2V
2 series · 2 of 2 positions shown · non-contrast
Comparison: 10/31/2012; portions [DATE]

CLINICAL DATA: Headache, fever, chills, chest and pelvic pain

EXAM:
CHEST  2 VIEW

[w chest lat]
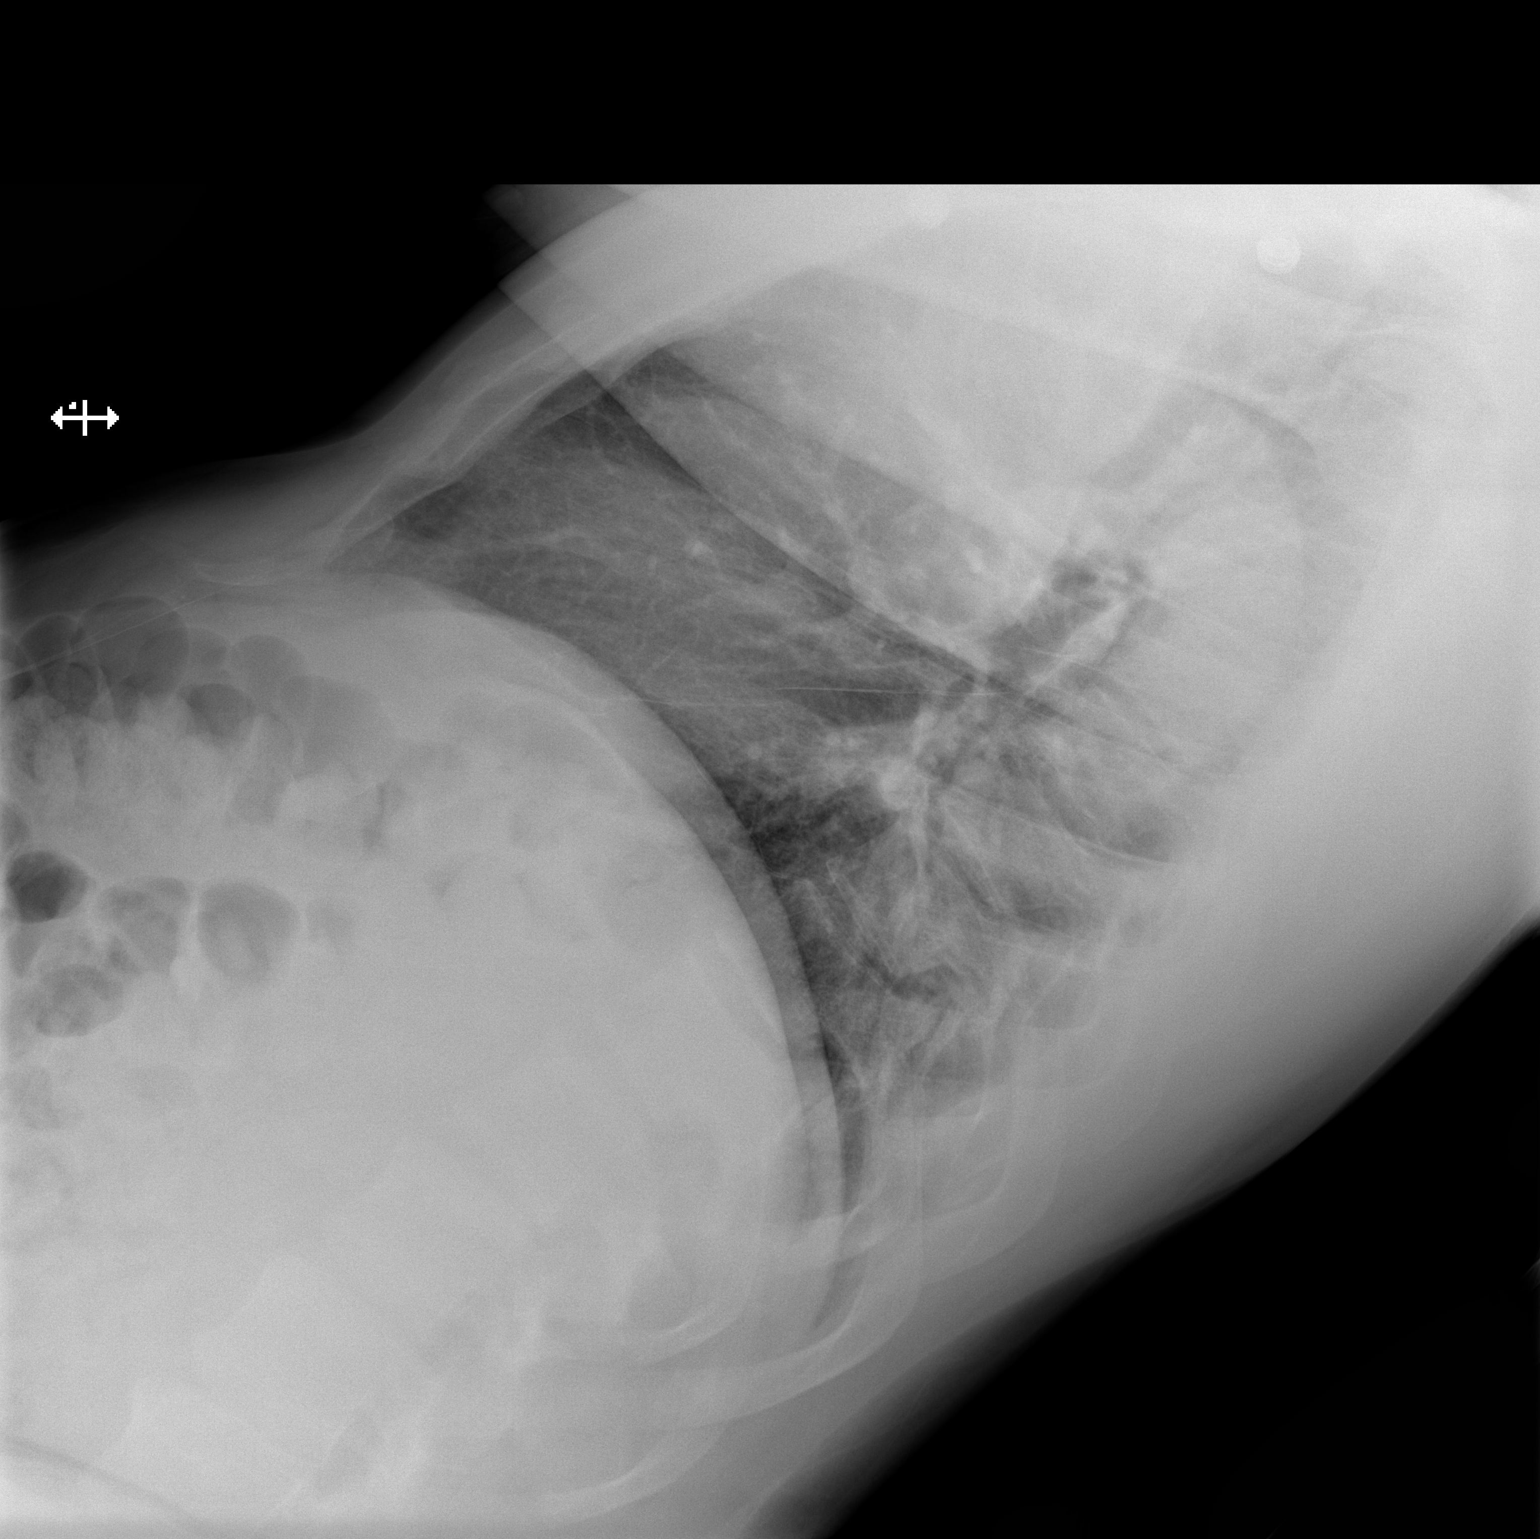

[x chest ap]
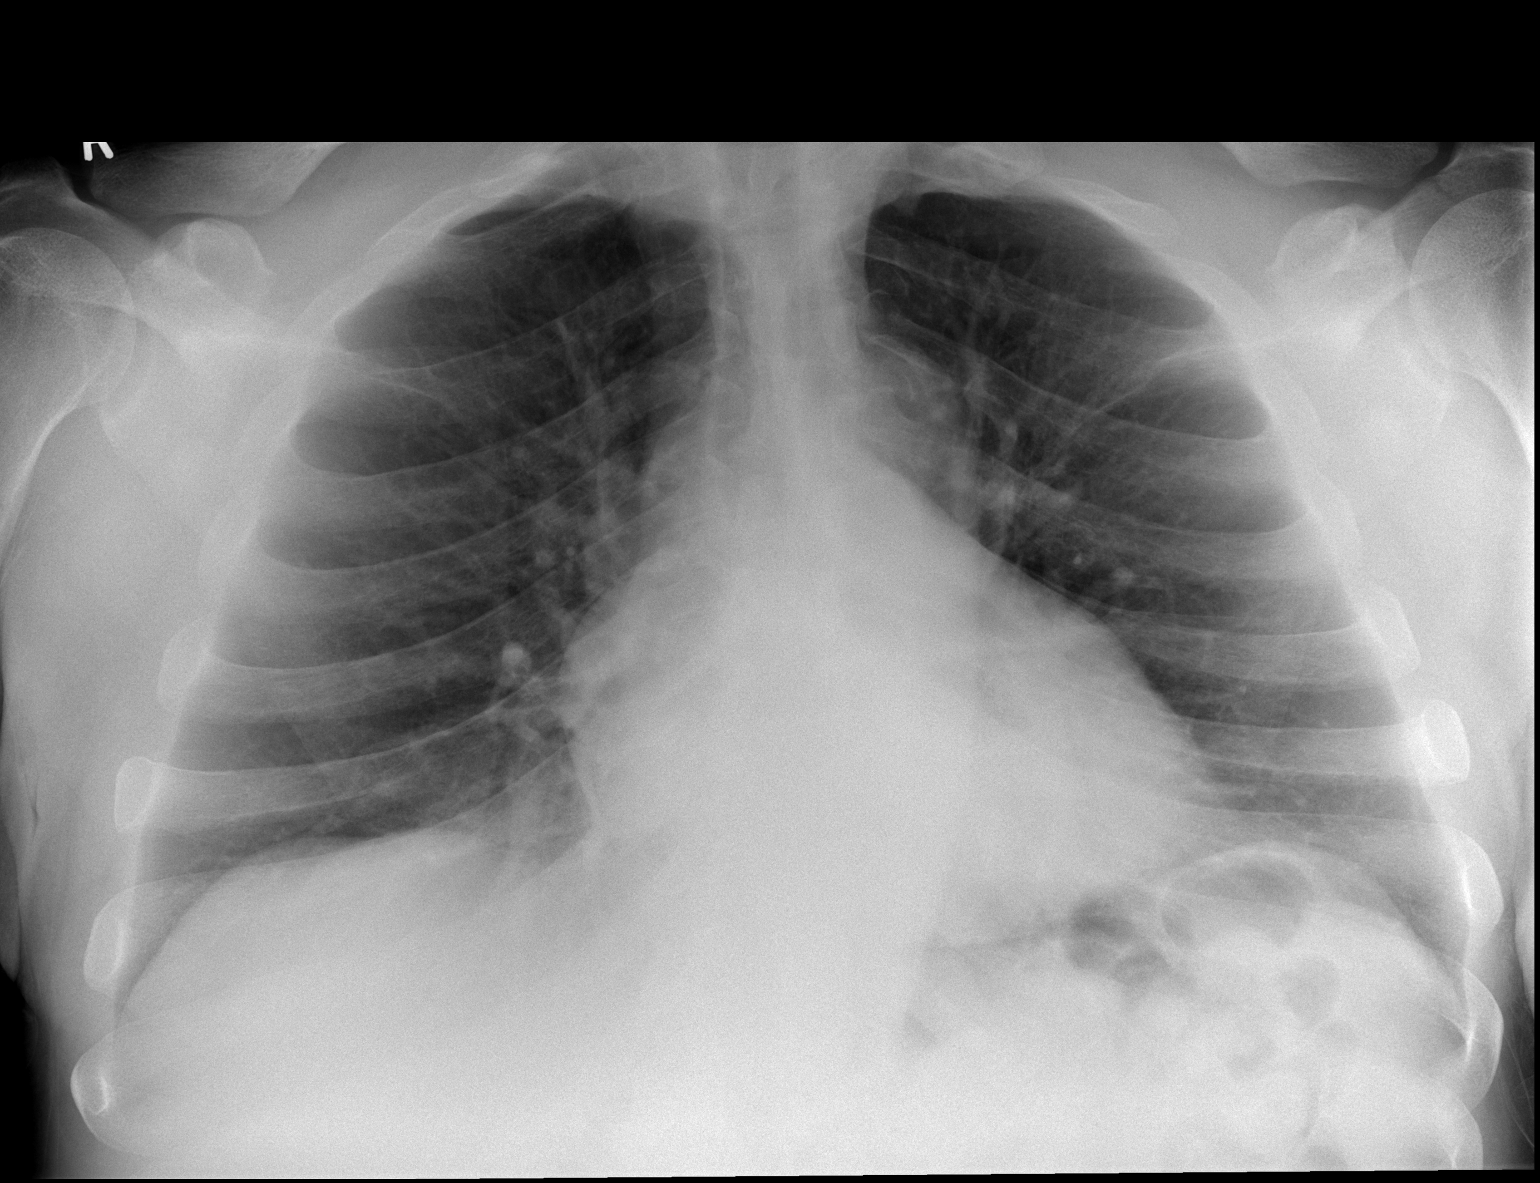

[2 of 2 positions shown; findings below may reference images not displayed]

FINDINGS: Grossly unchanged cardiac silhouette and mediastinal contours given
marked lordotic projection. Evaluation retrosternal clear space
obscured secondary to overlying soft tissues. No focal airspace
opacities. No pleural effusion or pneumothorax. No definite evidence
of edema. Grossly unchanged bones.
IMPRESSION: No definite acute cardiopulmonary disease on this lordotic
examination.

## 2014-01-22 ENCOUNTER — Other Ambulatory Visit: Payer: Self-pay | Admitting: Internal Medicine

## 2014-01-22 ENCOUNTER — Other Ambulatory Visit: Payer: Self-pay | Admitting: *Deleted

## 2014-01-22 DIAGNOSIS — B20 Human immunodeficiency virus [HIV] disease: Secondary | ICD-10-CM

## 2014-01-22 MED ORDER — EMTRICITABINE-TENOFOVIR DF 200-300 MG PO TABS: ORAL_TABLET | ORAL | Status: DC

## 2014-01-22 NOTE — Telephone Encounter (Signed)
Please schedule a follow-up appointment with patient's PCP. 

## 2014-01-27 ENCOUNTER — Other Ambulatory Visit: Payer: Self-pay | Admitting: Infectious Diseases

## 2014-01-27 ENCOUNTER — Other Ambulatory Visit: Payer: Self-pay | Admitting: Neurology

## 2014-01-27 ENCOUNTER — Other Ambulatory Visit: Payer: Self-pay | Admitting: Internal Medicine

## 2014-01-27 ENCOUNTER — Other Ambulatory Visit: Payer: Self-pay | Admitting: Nurse Practitioner

## 2014-01-28 ENCOUNTER — Other Ambulatory Visit: Payer: Self-pay | Admitting: Infectious Diseases

## 2014-01-29 DIAGNOSIS — Z21 Asymptomatic human immunodeficiency virus [HIV] infection status: Secondary | ICD-10-CM | POA: Diagnosis not present

## 2014-01-29 DIAGNOSIS — H2510 Age-related nuclear cataract, unspecified eye: Secondary | ICD-10-CM | POA: Diagnosis not present

## 2014-01-30 ENCOUNTER — Ambulatory Visit (INDEPENDENT_AMBULATORY_CARE_PROVIDER_SITE_OTHER): Payer: Self-pay | Admitting: Emergency Medicine

## 2014-01-30 VITALS — BP 126/76 | HR 90 | Temp 97.8°F | Resp 18 | Ht 67.5 in | Wt 212.8 lb

## 2014-01-30 DIAGNOSIS — Z0289 Encounter for other administrative examinations: Secondary | ICD-10-CM

## 2014-01-30 NOTE — Progress Notes (Deleted)
Chief Complaint:  Chief Complaint  Patient presents with  . DOT physical    HPI: Casey Brennan is a 53 y.o. male who is here for  ***  Past Medical History  Diagnosis Date  . HIV (human immunodeficiency virus infection) 1988  . Chronic anemia     normocytic  . Chronic folliculitis   . Gross hematuria   . Elevated PSA   . H/O pericarditis     2010--  myopercarditis--  resolved  . History of concussion     2012  &  2013  RESIDUAL HA'S --  RESOLVED  . Axillary lymphadenopathy   . History of MRSA infection 2010    infected boil  . History of gastric ulcer   . CKD (chronic kidney disease), stage II     nephrologist-  dr patel (secondary to hiv vs htn)  . Bilateral renal cysts   . Lytic bone lesion of hip     WORK-UP DONE BY ONCOLOGIST DR HA --  NOT MALIGNANT  . Wears glasses   . Headache(784.0)     HX SEVERE FRONTAL HA'S  . Prostate cancer 04/25/13    gleason 3+3=6, volume 30 gm  . Ulcer     hx of gastric  . Asthma     very rare  . Post concussion syndrome   . Arthritis     r knee   . Pneumonia     hx of   Past Surgical History  Procedure Laterality Date  . Upper leg soft tissue biopsy  2012    left thigh  . Knee arthroscopy Right 1985  . Colonoscopy  12/26/2011    Procedure: COLONOSCOPY;  Surgeon: Lear Ng, MD;  Location: [facility];  Service: Endoscopy;  Laterality: N/A;  . Esophagogastroduodenoscopy  12/26/2011    Procedure: ESOPHAGOGASTRODUODENOSCOPY (EGD);  Surgeon: Lear Ng, MD;  Location: [facility];  Service: Endoscopy;  Laterality: N/A;  . Cardiac catheterization  03-23-2009  DR COOPER    NORMAL CORONARY ARTERIES  . Excisional bx left breast mass/  i  &  d left breast abscess  03-24-2009  . Excision chronic left breast abscess  09-21-2010  . Cystoscopy/retrograde/ureteroscopy Bilateral 04/25/2013    Procedure: CYSTOSCOPY/ BILATERAL RETROGRADES; BLADDER BIOPSIES;  Surgeon: Alexis Frock, MD;  Location: [facility];  Service: Urology;  Laterality: Bilateral;  . Prostate biopsy N/A 04/25/2013    Procedure: BIOPSY TRANSRECTAL ULTRASONIC PROSTATE (TUBP);  Surgeon: Alexis Frock, MD;  Location: [facility];  Service: Urology;  Laterality: N/A;  . Left hip biopsy  10/2012  . Left axilla biopsy  04/2013  . Robot assisted laparoscopic radical prostatectomy N/A 08/18/2013    Procedure: ROBOTIC ASSISTED LAPAROSCOPIC RADICAL PROSTATECTOMY;  Surgeon: Alexis Frock, MD;  Location: [facility];  Service: Urology;  Laterality: N/A;  . Lymphadenectomy Bilateral 08/18/2013    Procedure: LYMPHADENECTOMY "PELVIC LYMPH NODE DISSECTION";  Surgeon: Alexis Frock, MD;  Location: [facility];  Service: Urology;  Laterality: Bilateral;   History   Social History  . Marital Status: Divorced    Spouse Name: N/A    Number of Children: N/A  . 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 Smoking status: Former Smoker -- 1.50 packs/day for 0 years    Types: Cigars, Cigarettes    Quit date: 05/22/2010  . Smokeless tobacco: Never Used  .  Alcohol Use: No  . Drug Use: No  . Sexual Activity: Not Currently    Birth Control/ Protection: Condom     Comment: pt. declined condoms   Other Topics Concern  . None   Social History Narrative   Sister April stays with him currently although he hopes to return to truck driving. 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 Sister is starting medical school next year.   Family History  Problem Relation Age of Onset  . Stroke Father   . Diabetes Father   . Cancer Father     brain cancer  . Hypertension Sister   . Cancer Maternal Uncle     prostate cancer   Allergies  Allergen Reactions  . Penicillins Shortness Of Breath and Rash  . Sulfonamide Derivatives Shortness Of Breath and Rash  . Morphine And Related     Itching, shortness of breath, chest pain   Prior to Admission  medications   Medication Sig Start Date End Date Taking? Authorizing Provider  beclomethasone (QVAR) 80 MCG/ACT inhaler Inhale 1 puff into the lungs 2 (two) times daily.   Yes Historical Provider, MD  emtricitabine-tenofovir (TRUVADA) 200-300 MG per tablet TAKE 1 TABLET BY MOUTH DAILY WITH PREZISTA AND NORVIR 01/22/14  Yes Campbell Riches, MD  ISENTRESS 400 MG tablet TAKE 1 TABLET BY MOUTH TWICE DAILY 01/22/14  Yes Campbell Riches, MD  levalbuterol [facility] HFA) 45 MCG/ACT inhaler Inhale 1-2 puffs into the lungs every 4 (four) hours as needed for wheezing. 11/02/12  Yes Otho Bellows, MD  mupirocin ointment (BACTROBAN) 2 % APPLY TOPICALLY TO AFFECTED AREA TWICE DAILY FOR 2 WEEKS. PLACE COTTON BETWEEN TOES TO ALLOW BETTER AERATION 01/27/14  Yes Carlyle Basques, MD  NORVIR 100 MG TABS tablet TAKE 1 TABLET BY MOUTH DAILY WITH PREZISTA AND TRUVADA 01/22/14  Yes Campbell Riches, MD  omeprazole (PRILOSEC) 40 MG capsule Take 40 mg by mouth at bedtime.    Yes Historical Provider, MD  ondansetron (ZOFRAN) 4 MG tablet TAKE 1 TABLET BY MOUTH EVERY 8 HOURS AS NEEDED FOR NAUSEA 01/27/14  Yes Carlyle Basques, MD  PREZISTA 800 MG tablet TAKE 1 TABLET BY MOUTH DAILY WITH NORVIR AND TRUVADA 01/22/14  Yes Campbell Riches, MD  traZODone (DESYREL) 50 MG tablet Take 1 tablet (50 mg total) by mouth at bedtime. 01/22/14  Yes Axel Filler, MD  VOLTAREN 1 % GEL APPLY TO AFFECTED AREA TWICE DAILY AS NEEDED 01/28/14  Yes Campbell Riches, MD  ZANAFLEX 6 MG capsule TAKE ONE CAPSULE BY MOUTH THREE TIMES DAILY AS NEEDED FOR MUSCLE SPASMS 01/27/14  Yes Larey Seat, MD     ROS: The patient denies fevers, chills, night sweats, unintentional weight loss, chest pain, palpitations, wheezing, dyspnea on exertion, nausea, vomiting, abdominal pain, dysuria, hematuria, melena, numbness, weakness, or tingling. ***  All other systems have been reviewed and were otherwise negative with the exception of those mentioned in the HPI and  as above.    PHYSICAL EXAM: Filed Vitals:   01/30/14 1747  BP: 140/86  Pulse: 90  Temp: 97.8 F (36.6 C)  Resp: 18   Filed Vitals:   01/30/14 1747  Height: 5' 7.5" (1.715 m)  Weight: 212 lb 12.8 oz (96.525 kg)   Body mass index is 32.82 kg/(m^2).  General: Alert, no acute distress HEENT:  Normocephalic, atraumatic, oropharynx patent. EOMI, PERRLA Cardiovascular:  Regular rate and rhythm, no rubs murmurs or gallops.  No Carotid bruits, radial pulse intact.  No pedal edema.  Respiratory: Clear to auscultation bilaterally.  No wheezes, rales, or rhonchi.  No cyanosis, no use of accessory musculature GI: No organomegaly, abdomen is soft and non-tender, positive bowel sounds.  No masses. Skin: No rashes. Neurologic: Facial musculature symmetric. Psychiatric: Patient is appropriate throughout our interaction. Lymphatic: No cervical lymphadenopathy Musculoskeletal: Gait intact.   LABS: Results for orders placed in visit on 10/13/13  HIV 1 RNA QUANT-NO REFLEX-BLD      Result Value Ref Range   HIV 1 RNA Quant <20  <20 copies/mL   HIV1 RNA Quant, Log <1.30  <1.30 log 10  CBC WITH DIFFERENTIAL      Result Value Ref Range   WBC 5.4  4.0 - 10.5 K/uL   RBC 4.15 (*) 4.22 - 5.81 MIL/uL   Hemoglobin 9.6 (*) 13.0 - 17.0 g/dL   HCT 32.4 (*) 39.0 - 52.0 %   MCV 78.1  78.0 - 100.0 fL   MCH 23.1 (*) 26.0 - 34.0 pg   MCHC 29.6 (*) 30.0 - 36.0 g/dL   RDW 23.3 (*) 11.5 - 15.5 %   Platelets 271  150 - 400 K/uL   Neutrophils Relative % 42 (*) 43 - 77 %   Neutro Abs 2.3  1.7 - 7.7 K/uL   Lymphocytes Relative 38  12 - 46 %   Lymphs Abs 2.1  0.7 - 4.0 K/uL   Monocytes Relative 8  3 - 12 %   Monocytes Absolute 0.4  0.1 - 1.0 K/uL   Eosinophils Relative 11 (*) 0 - 5 %   Eosinophils Absolute 0.6  0.0 - 0.7 K/uL   Basophils Relative 1  0 - 1 %   Basophils Absolute 0.1  0.0 - 0.1 K/uL   Smear Review Criteria for review not met    COMPLETE METABOLIC PANEL WITH GFR      Result Value Ref Range    Sodium 139  135 - 145 mEq/L   Potassium 4.0  3.5 - 5.3 mEq/L   Chloride 103  96 - 112 mEq/L   CO2 27  19 - 32 mEq/L   Glucose, Bld 92  70 - 99 mg/dL   BUN 7  6 - 23 mg/dL   Creat 1.06  0.50 - 1.35 mg/dL   Total Bilirubin 0.5  0.2 - 1.2 mg/dL   Alkaline Phosphatase 110  39 - 117 U/L   AST 12  0 - 37 U/L   ALT 9  0 - 53 U/L   Total Protein 7.3  6.0 - 8.3 g/dL   Albumin 4.2  3.5 - 5.2 g/dL   Calcium 9.5  8.4 - 10.5 mg/dL   GFR, Est African American >89     GFR, Est Non African American 81    T-HELPER CELL (CD4)      Result Value Ref Range   CD4 T Cell Abs 430  400 - 2700 /uL   CD4 % Helper T Cell 22 (*) 33 - 55 %     EKG/XRAY:   Primary read interpreted by Dr. Marin Comment at [facility].   ASSESSMENT/PLAN: No diagnosis found.   Gross sideeffects, risk and benefits, and alternatives of medications d/w patient. Patient is aware that all medications have potential sideeffects and we are unable to predict every sideeffect or drug-drug interaction that may occur.  Kali Deadwyler, [place], DO 01/30/2014 5:59 PM

## 2014-01-30 NOTE — Progress Notes (Signed)
[facility] [address] [place] [address] [phone]  Date:  01/30/2014   Name:  Casey Brennan   DOB:  [birth year]   MRN:  [id number]  PCP:  Albin Felling, MD    Chief Complaint: DOT physical   History of Present Illness:  Casey Brennan is a 53 y.o. very pleasant male patient who presents with the following:  DOT certification  Patient Active Problem List   Diagnosis Date Noted  . Sepsis 2/2 UTI and proctitis 06/13/2013  . Chronic anemia   . Fever of unknown origin 05/31/2013  . Prostate cancer   . Lymphadenopathy, axillary 04/14/2013  . Postconcussion syndrome 02/18/2013  . Coughing 12/17/2012  . Drainage from ear, left 11/21/2012  . Sinusitis 11/01/2012  . Lytic bone lesion of hip 08/22/2012  . Onychomycosis 08/22/2012  . Bronchitis, acute 04/12/2012  . Rotator cuff syndrome of right shoulder 02/07/2012  . Wound, open, toe R 02/07/2012  . Right shoulder pain 01/23/2012  . Asthma 01/23/2012  . Symptomatic cholelithiasis 12/08/2011  . Insomnia 05/11/2011  . HTN (hypertension) 04/17/2011  . Proteinuria 04/17/2011  . RAD (reactive airway disease) 11/24/2010  . HEADACHE, CHRONIC 08/24/2010  . Mood disorder in conditions classified elsewhere 06/14/2009  . HIV INFECTION 04/19/2009    Past Medical History  Diagnosis Date  . HIV (human immunodeficiency virus infection) 1988  . Chronic anemia     normocytic  . Chronic folliculitis   . Gross hematuria   . Elevated PSA   . H/O pericarditis     2010--  myopercarditis--  resolved  . History of concussion     2012  &  2013  RESIDUAL HA'S --  RESOLVED  . Axillary lymphadenopathy   . History of MRSA infection 2010    infected boil  . History of gastric ulcer   . CKD (chronic kidney disease), stage II     nephrologist-  dr patel (secondary to hiv vs htn)  . Bilateral renal cysts   . Lytic bone lesion of hip     WORK-UP DONE BY ONCOLOGIST DR HA --  NOT MALIGNANT  . Wears glasses   .  Headache(784.0)     HX SEVERE FRONTAL HA'S  . Prostate cancer 04/25/13    gleason 3+3=6, volume 30 gm  . Ulcer     hx of gastric  . Asthma     very rare  . Post concussion syndrome   . Arthritis     r knee   . Pneumonia     hx of    Past Surgical History  Procedure Laterality Date  . Upper leg soft tissue biopsy  2012    left thigh  . Knee arthroscopy Right 1985  . Colonoscopy  12/26/2011    Procedure: COLONOSCOPY;  Surgeon: Lear Ng, MD;  Location: [facility];  Service: Endoscopy;  Laterality: N/A;  . Esophagogastroduodenoscopy  12/26/2011    Procedure: ESOPHAGOGASTRODUODENOSCOPY (EGD);  Surgeon: Lear Ng, MD;  Location: [facility];  Service: Endoscopy;  Laterality: N/A;  . Cardiac catheterization  03-23-2009  DR COOPER    NORMAL CORONARY ARTERIES  . Excisional bx left breast mass/  i  &  d left breast abscess  03-24-2009  . Excision chronic left breast abscess  09-21-2010  . Cystoscopy/retrograde/ureteroscopy Bilateral 04/25/2013    Procedure: CYSTOSCOPY/ BILATERAL RETROGRADES; BLADDER BIOPSIES;  Surgeon: Alexis Frock, MD;  Location: [facility];  Service: Urology;  Laterality: Bilateral;  . Prostate  biopsy N/A 04/25/2013    Procedure: BIOPSY TRANSRECTAL ULTRASONIC PROSTATE (TUBP);  Surgeon: Alexis Frock, MD;  Location: [facility];  Service: Urology;  Laterality: N/A;  . Left hip biopsy  10/2012  . Left axilla biopsy  04/2013  . Robot assisted laparoscopic radical prostatectomy N/A 08/18/2013    Procedure: ROBOTIC ASSISTED LAPAROSCOPIC RADICAL PROSTATECTOMY;  Surgeon: Alexis Frock, MD;  Location: [facility];  Service: Urology;  Laterality: N/A;  . Lymphadenectomy Bilateral 08/18/2013    Procedure: LYMPHADENECTOMY "PELVIC LYMPH NODE DISSECTION";  Surgeon: Alexis Frock, MD;  Location: [facility];  Service: Urology;  Laterality: Bilateral;    History  Substance Use Topics  . Smoking status: Former Smoker -- 1.50 packs/day  for 0 years    Types: Cigars, Cigarettes    Quit date: 05/22/2010  . Smokeless tobacco: Never Used  . Alcohol Use: No    Family History  Problem Relation Age of Onset  . Stroke Father   . Diabetes Father   . Cancer Father     brain cancer  . Hypertension Sister   . Cancer Maternal Uncle     prostate cancer    Allergies  Allergen Reactions  . Penicillins Shortness Of Breath and Rash  . Sulfonamide Derivatives Shortness Of Breath and Rash  . Morphine And Related     Itching, shortness of breath, chest pain    Medication list has been reviewed and updated.  Current Outpatient Prescriptions on File Prior to Visit  Medication Sig Dispense Refill  . beclomethasone (QVAR) 80 MCG/ACT inhaler Inhale 1 puff into the lungs 2 (two) times daily.      [name] emtricitabine-tenofovir (TRUVADA) 200-300 MG per tablet TAKE 1 TABLET BY MOUTH DAILY WITH PREZISTA AND NORVIR  30 tablet  5  . ISENTRESS 400 MG tablet TAKE 1 TABLET BY MOUTH TWICE DAILY  60 tablet  5  . levalbuterol (XOPENEX HFA) 45 MCG/ACT inhaler Inhale 1-2 puffs into the lungs every 4 (four) hours as needed for wheezing.  1 Inhaler  12  . mupirocin ointment (BACTROBAN) 2 % APPLY TOPICALLY TO AFFECTED AREA TWICE DAILY FOR 2 WEEKS. PLACE COTTON BETWEEN TOES TO ALLOW BETTER AERATION  66 g  0  . NORVIR 100 MG TABS tablet TAKE 1 TABLET BY MOUTH DAILY WITH PREZISTA AND TRUVADA  30 tablet  5  . omeprazole (PRILOSEC) 40 MG capsule Take 40 mg by mouth at bedtime.       . ondansetron (ZOFRAN) 4 MG tablet TAKE 1 TABLET BY MOUTH EVERY 8 HOURS AS NEEDED FOR NAUSEA  30 tablet  0  . PREZISTA 800 MG tablet TAKE 1 TABLET BY MOUTH DAILY WITH NORVIR AND TRUVADA  30 tablet  5  . traZODone (DESYREL) 50 MG tablet Take 1 tablet (50 mg total) by mouth at bedtime.  30 tablet  0  . VOLTAREN 1 % GEL APPLY TO AFFECTED AREA TWICE DAILY AS NEEDED  100 g  0  . ZANAFLEX 6 MG capsule TAKE ONE CAPSULE BY MOUTH THREE TIMES DAILY AS NEEDED FOR MUSCLE SPASMS  90 capsule  0   . [DISCONTINUED] famotidine (PEPCID AC) 10 MG tablet One po bid x 3 days and then prn upper abdominal pain  20 tablet  0   No current facility-administered medications on file prior to visit.    Review of Systems:  As per HPI, otherwise negative.    Physical Examination: Filed Vitals:   01/30/14 1747  BP: 126/76  Pulse: 90  Temp: 97.8 F (  36.6 C)  Resp: 18   Filed Vitals:   01/30/14 1747  Height: 5' 7.5" (1.715 m)  Weight: 212 lb 12.8 oz (96.525 kg)   Body mass index is 32.82 kg/(m^2). Ideal Body Weight: Weight in (lb) to have BMI = 25: 161.7  GEN: WDWN, NAD, Non-toxic, A & O x 3 HEENT: Atraumatic, Normocephalic. Neck supple. No masses, No LAD. Ears and Nose: No external deformity. CV: RRR, No M/G/R. No JVD. No thrill. No extra heart sounds. PULM: CTA B, no wheezes, crackles, rhonchi. No retractions. No resp. distress. No accessory muscle use. ABD: S, NT, ND, +BS. No rebound. No HSM. EXTR: No c/c/e NEURO Normal gait.  PSYCH: Normally interactive. Conversant. Not depressed or anxious appearing.  Calm demeanor.    Assessment and Plan: DOT certification   Signed,  Ellison Carwin, MD

## 2014-02-01 ENCOUNTER — Emergency Department (HOSPITAL_COMMUNITY): Payer: Medicare Other

## 2014-02-01 ENCOUNTER — Encounter (HOSPITAL_COMMUNITY): Payer: Self-pay | Admitting: Emergency Medicine

## 2014-02-01 ENCOUNTER — Inpatient Hospital Stay (HOSPITAL_COMMUNITY)
Admission: EM | Admit: 2014-02-01 | Discharge: 2014-02-03 | DRG: 202 | Disposition: A | Discharge status: Home / Self Care | Payer: Medicare Other | Attending: Internal Medicine | Admitting: Internal Medicine

## 2014-02-01 DIAGNOSIS — J40 Bronchitis, not specified as acute or chronic: Secondary | ICD-10-CM | POA: Diagnosis not present

## 2014-02-01 DIAGNOSIS — B20 Human immunodeficiency virus [HIV] disease: Secondary | ICD-10-CM | POA: Diagnosis not present

## 2014-02-01 DIAGNOSIS — N281 Cyst of kidney, acquired: Secondary | ICD-10-CM | POA: Diagnosis not present

## 2014-02-01 DIAGNOSIS — R072 Precordial pain: Secondary | ICD-10-CM | POA: Diagnosis present

## 2014-02-01 DIAGNOSIS — R079 Chest pain, unspecified: Secondary | ICD-10-CM | POA: Diagnosis not present

## 2014-02-01 DIAGNOSIS — J441 Chronic obstructive pulmonary disease with (acute) exacerbation: Secondary | ICD-10-CM | POA: Diagnosis not present

## 2014-02-01 DIAGNOSIS — G44309 Post-traumatic headache, unspecified, not intractable: Secondary | ICD-10-CM | POA: Diagnosis present

## 2014-02-01 DIAGNOSIS — J189 Pneumonia, unspecified organism: Secondary | ICD-10-CM

## 2014-02-01 DIAGNOSIS — R1013 Epigastric pain: Secondary | ICD-10-CM

## 2014-02-01 DIAGNOSIS — IMO0001 Reserved for inherently not codable concepts without codable children: Secondary | ICD-10-CM

## 2014-02-01 DIAGNOSIS — M545 Low back pain, unspecified: Secondary | ICD-10-CM | POA: Diagnosis not present

## 2014-02-01 DIAGNOSIS — Z87891 Personal history of nicotine dependence: Secondary | ICD-10-CM

## 2014-02-01 DIAGNOSIS — R059 Cough, unspecified: Secondary | ICD-10-CM

## 2014-02-01 DIAGNOSIS — K3189 Other diseases of stomach and duodenum: Secondary | ICD-10-CM | POA: Diagnosis present

## 2014-02-01 DIAGNOSIS — Z21 Asymptomatic human immunodeficiency virus [HIV] infection status: Secondary | ICD-10-CM | POA: Diagnosis present

## 2014-02-01 DIAGNOSIS — N182 Chronic kidney disease, stage 2 (mild): Secondary | ICD-10-CM | POA: Diagnosis present

## 2014-02-01 DIAGNOSIS — D649 Anemia, unspecified: Secondary | ICD-10-CM | POA: Diagnosis present

## 2014-02-01 DIAGNOSIS — R509 Fever, unspecified: Secondary | ICD-10-CM

## 2014-02-01 DIAGNOSIS — Z8546 Personal history of malignant neoplasm of prostate: Secondary | ICD-10-CM | POA: Diagnosis not present

## 2014-02-01 DIAGNOSIS — J45901 Unspecified asthma with (acute) exacerbation: Secondary | ICD-10-CM | POA: Diagnosis present

## 2014-02-01 DIAGNOSIS — I129 Hypertensive chronic kidney disease with stage 1 through stage 4 chronic kidney disease, or unspecified chronic kidney disease: Secondary | ICD-10-CM | POA: Diagnosis present

## 2014-02-01 DIAGNOSIS — Z9079 Acquired absence of other genital organ(s): Secondary | ICD-10-CM | POA: Diagnosis not present

## 2014-02-01 DIAGNOSIS — Z7982 Long term (current) use of aspirin: Secondary | ICD-10-CM | POA: Diagnosis not present

## 2014-02-01 DIAGNOSIS — J209 Acute bronchitis, unspecified: Principal | ICD-10-CM

## 2014-02-01 DIAGNOSIS — Z8614 Personal history of Methicillin resistant Staphylococcus aureus infection: Secondary | ICD-10-CM

## 2014-02-01 DIAGNOSIS — I10 Essential (primary) hypertension: Secondary | ICD-10-CM | POA: Diagnosis present

## 2014-02-01 DIAGNOSIS — R51 Headache: Secondary | ICD-10-CM

## 2014-02-01 DIAGNOSIS — F0781 Postconcussional syndrome: Secondary | ICD-10-CM | POA: Diagnosis present

## 2014-02-01 DIAGNOSIS — R110 Nausea: Secondary | ICD-10-CM | POA: Diagnosis not present

## 2014-02-01 DIAGNOSIS — J45909 Unspecified asthma, uncomplicated: Secondary | ICD-10-CM

## 2014-02-01 DIAGNOSIS — M549 Dorsalgia, unspecified: Secondary | ICD-10-CM | POA: Diagnosis not present

## 2014-02-01 DIAGNOSIS — C61 Malignant neoplasm of prostate: Secondary | ICD-10-CM

## 2014-02-01 DIAGNOSIS — R0602 Shortness of breath: Secondary | ICD-10-CM | POA: Diagnosis not present

## 2014-02-01 DIAGNOSIS — R519 Headache, unspecified: Secondary | ICD-10-CM | POA: Diagnosis present

## 2014-02-01 DIAGNOSIS — R05 Cough: Secondary | ICD-10-CM

## 2014-02-01 LAB — CBC WITH DIFFERENTIAL/PLATELET
Basophils Absolute: 0 10*3/uL (ref 0.0–0.1)
Basophils Relative: 1 % (ref 0–1)
Eosinophils Absolute: 0.9 10*3/uL — ABNORMAL HIGH (ref 0.0–0.7)
Eosinophils Relative: 13 % — ABNORMAL HIGH (ref 0–5)
HCT: 28 % — ABNORMAL LOW (ref 39.0–52.0)
Hemoglobin: 8.4 g/dL — ABNORMAL LOW (ref 13.0–17.0)
Lymphocytes Relative: 32 % (ref 12–46)
Lymphs Abs: 2.2 10*3/uL (ref 0.7–4.0)
MCH: 22.7 pg — ABNORMAL LOW (ref 26.0–34.0)
MCHC: 30 g/dL (ref 30.0–36.0)
MCV: 75.7 fL — ABNORMAL LOW (ref 78.0–100.0)
Monocytes Absolute: 0.6 10*3/uL (ref 0.1–1.0)
Monocytes Relative: 9 % (ref 3–12)
Neutro Abs: 3.1 10*3/uL (ref 1.7–7.7)
Neutrophils Relative %: 46 % (ref 43–77)
Platelets: 242 10*3/uL (ref 150–400)
RBC: 3.7 MIL/uL — ABNORMAL LOW (ref 4.22–5.81)
RDW: 22.2 % — ABNORMAL HIGH (ref 11.5–15.5)
WBC: 6.7 10*3/uL (ref 4.0–10.5)

## 2014-02-01 LAB — URINALYSIS, ROUTINE W REFLEX MICROSCOPIC
Bilirubin Urine: NEGATIVE
Glucose, UA: NEGATIVE mg/dL
Hgb urine dipstick: NEGATIVE
Ketones, ur: NEGATIVE mg/dL
Leukocytes, UA: NEGATIVE
Nitrite: NEGATIVE
Protein, ur: NEGATIVE mg/dL
Specific Gravity, Urine: 1.008 (ref 1.005–1.030)
Urobilinogen, UA: 1 mg/dL (ref 0.0–1.0)
pH: 6 (ref 5.0–8.0)

## 2014-02-01 LAB — COMPREHENSIVE METABOLIC PANEL
ALT: 14 U/L (ref 0–53)
AST: 17 U/L (ref 0–37)
Albumin: 3.8 g/dL (ref 3.5–5.2)
Alkaline Phosphatase: 108 U/L (ref 39–117)
Anion gap: 10 (ref 5–15)
BUN: 12 mg/dL (ref 6–23)
CO2: 26 mEq/L (ref 19–32)
Calcium: 8.9 mg/dL (ref 8.4–10.5)
Chloride: 104 mEq/L (ref 96–112)
Creatinine, Ser: 1.28 mg/dL (ref 0.50–1.35)
GFR calc Af Amer: 74 mL/min — ABNORMAL LOW (ref >90)
GFR calc non Af Amer: 64 mL/min — ABNORMAL LOW (ref >90)
Glucose, Bld: 92 mg/dL (ref 70–99)
Potassium: 4 mEq/L (ref 3.7–5.3)
Sodium: 140 mEq/L (ref 137–147)
Total Bilirubin: 1.2 mg/dL (ref 0.3–1.2)
Total Protein: 7 g/dL (ref 6.0–8.3)

## 2014-02-01 MED ORDER — HYDROMORPHONE HCL PF 1 MG/ML IJ SOLN
1.0000 mg | Freq: Once | INTRAMUSCULAR | Status: AC
  Administered 2014-02-01: 1 mg via INTRAVENOUS
  Filled 2014-02-01: qty 1

## 2014-02-01 MED ORDER — PROCHLORPERAZINE EDISYLATE 5 MG/ML IJ SOLN
10.0000 mg | Freq: Four times a day (QID) | INTRAMUSCULAR | Status: DC | PRN
  Filled 2014-02-01 (×2): qty 2

## 2014-02-01 MED ORDER — ONDANSETRON HCL 4 MG/2ML IJ SOLN
4.0000 mg | Freq: Once | INTRAMUSCULAR | Status: AC
  Administered 2014-02-01: 4 mg via INTRAVENOUS
  Filled 2014-02-01: qty 2

## 2014-02-01 MED ORDER — SODIUM CHLORIDE 0.9 % IV BOLUS (SEPSIS)
1000.0000 mL | Freq: Once | INTRAVENOUS | Status: AC
  Administered 2014-02-01: 1000 mL via INTRAVENOUS

## 2014-02-01 MED ORDER — IPRATROPIUM BROMIDE 0.02 % IN SOLN
0.5000 mg | Freq: Once | RESPIRATORY_TRACT | Status: AC
  Administered 2014-02-02: 0.5 mg via RESPIRATORY_TRACT
  Filled 2014-02-01: qty 2.5

## 2014-02-01 MED ORDER — METOCLOPRAMIDE HCL 5 MG/ML IJ SOLN: 10.0000 mg | Freq: Once | INTRAMUSCULAR | Status: DC

## 2014-02-01 MED ORDER — DIPHENHYDRAMINE HCL 50 MG/ML IJ SOLN
25.0000 mg | Freq: Once | INTRAMUSCULAR | Status: AC
  Administered 2014-02-01: 25 mg via INTRAVENOUS
  Filled 2014-02-01: qty 1

## 2014-02-01 MED ORDER — LEVALBUTEROL HCL 1.25 MG/0.5ML IN NEBU
1.2500 mg | INHALATION_SOLUTION | Freq: Once | RESPIRATORY_TRACT | Status: AC
  Administered 2014-02-02: 1.25 mg via RESPIRATORY_TRACT
  Filled 2014-02-01: qty 0.5

## 2014-02-01 NOTE — ED Provider Notes (Signed)
CSN: [id number]     Arrival date & time 02/01/14  1415 History   First MD Initiated Contact with Patient 02/01/14 1638     Chief Complaint  Patient presents with  . Headache  . Back Pain     (Consider location/radiation/quality/duration/timing/severity/associated sxs/prior Treatment) HPI  JUSTINE COSSIN is a 53 y.o. male with multiple medical conditions presenting for shortness of breath headache and low back pain. Per patient's spouse he had an episode earlier this week where he had acute dyspnea and had possible perioral cyanosis. In addition patient has had worsening headache and low back pain relative to his baseline. Patient has a history of prior headaches, and chronic low back pain, however today's headache is different. Headache has been gradual in onset is associated with photophobia. Patient has difficulty giving further details of the headache, but states that his "head just aches." Patient also has midline low back pain, which has been worse than usual. Patient endorses compliance with his antiretroviral regimen, and home inhalers. However, patient's inhalers have not been adequate to improve his shortness of breath, cough, and sputum production.  Patient endorses some mild substernal chest pain this time associated with his cough. He endorses subjective fever and chills.     Past Medical History  Diagnosis Date  . HIV (human immunodeficiency virus infection) 1988  . Chronic anemia     normocytic  . Chronic folliculitis   . Gross hematuria   . Elevated PSA   . H/O pericarditis     2010--  myopercarditis--  resolved  . History of concussion     2012  &  2013  RESIDUAL HA'S --  RESOLVED  . Axillary lymphadenopathy   . History of MRSA infection 2010    infected boil  . History of gastric ulcer   . CKD (chronic kidney disease), stage II     nephrologist-  dr patel (secondary to hiv vs htn)  . Bilateral renal cysts   . Lytic bone lesion of hip     WORK-UP DONE BY  ONCOLOGIST DR HA --  NOT MALIGNANT  . Wears glasses   . Headache(784.0)     HX SEVERE FRONTAL HA'S  . Prostate cancer 04/25/13    gleason 3+3=6, volume 30 gm  . Ulcer     hx of gastric  . Asthma     very rare  . Post concussion syndrome   . Arthritis     r knee   . Pneumonia     hx of   Past Surgical History  Procedure Laterality Date  . Upper leg soft tissue biopsy  2012    left thigh  . Knee arthroscopy Right 1985  . Colonoscopy  12/26/2011    Procedure: COLONOSCOPY;  Surgeon: Lear Ng, MD;  Location: [facility];  Service: Endoscopy;  Laterality: N/A;  . Esophagogastroduodenoscopy  12/26/2011    Procedure: ESOPHAGOGASTRODUODENOSCOPY (EGD);  Surgeon: Lear Ng, MD;  Location: [facility];  Service: Endoscopy;  Laterality: N/A;  . Cardiac catheterization  03-23-2009  DR COOPER    NORMAL CORONARY ARTERIES  . Excisional bx left breast mass/  i  &  d left breast abscess  03-24-2009  . Excision chronic left breast abscess  09-21-2010  . Cystoscopy/retrograde/ureteroscopy Bilateral 04/25/2013    Procedure: CYSTOSCOPY/ BILATERAL RETROGRADES; BLADDER BIOPSIES;  Surgeon: Alexis Frock, MD;  Location: [facility];  Service: Urology;  Laterality: Bilateral;  . Prostate biopsy N/A 04/25/2013    Procedure: BIOPSY TRANSRECTAL ULTRASONIC  PROSTATE (TUBP);  Surgeon: Alexis Frock, MD;  Location: [facility];  Service: Urology;  Laterality: N/A;  . Left hip biopsy  10/2012  . Left axilla biopsy  04/2013  . Robot assisted laparoscopic radical prostatectomy N/A 08/18/2013    Procedure: ROBOTIC ASSISTED LAPAROSCOPIC RADICAL PROSTATECTOMY;  Surgeon: Alexis Frock, MD;  Location: [facility];  Service: Urology;  Laterality: N/A;  . Lymphadenectomy Bilateral 08/18/2013    Procedure: LYMPHADENECTOMY "PELVIC LYMPH NODE DISSECTION";  Surgeon: Alexis Frock, MD;  Location: [facility];  Service: Urology;  Laterality: Bilateral;   Family History  Problem  Relation Age of Onset  . Stroke Father   . Diabetes Father   . Cancer Father     brain cancer  . Hypertension Sister   . Cancer Maternal Uncle     prostate cancer   History  Substance Use Topics  . Smoking status: Former Smoker -- 1.50 packs/day for 0 years    Types: Cigars, Cigarettes    Quit date: 05/22/2010  . Smokeless tobacco: Never Used  . Alcohol Use: No    Review of Systems  Constitutional: Negative.   Eyes: Positive for photophobia.  Respiratory: Positive for cough and shortness of breath.   Cardiovascular: Positive for chest pain.  Gastrointestinal: Negative.   Endocrine: Negative.   Genitourinary: Negative.   Musculoskeletal: Positive for back pain.  Skin: Negative.   Allergic/Immunologic: Negative.   Neurological: Positive for headaches.  Hematological: Negative.   Psychiatric/Behavioral: Negative.       Allergies  Morphine and related; Penicillins; and Sulfonamide derivatives  Home Medications   Prior to Admission medications   Medication Sig Start Date End Date Taking? Authorizing Provider  aspirin EC 81 MG tablet Take 81 mg by mouth 3 (three) times a week. Monday, Tuesday and Wednesday   Yes Historical Provider, MD  beclomethasone (QVAR) 80 MCG/ACT inhaler Inhale 1 puff into the lungs 2 (two) times daily as needed (shortness of breath/wheezing).    Yes Historical Provider, MD  Darunavir Ethanolate (PREZISTA) 800 MG tablet Take 800 mg by mouth at bedtime.   Yes Historical Provider, MD  diclofenac sodium (VOLTAREN) 1 % GEL Apply 1 application topically 2 (two) times daily as needed (back and shoulder pain).   Yes Historical Provider, MD  emtricitabine-tenofovir (TRUVADA) 200-300 MG per tablet Take 1 tablet by mouth at bedtime.   Yes Historical Provider, MD  fluticasone (FLOVENT HFA) 220 MCG/ACT inhaler Inhale 1 puff into the lungs every 4 (four) hours as needed (shortness of breath/wheezing).   Yes Historical Provider, MD  gabapentin (NEURONTIN) 300 MG  capsule Take 300 mg by mouth 4 (four) times daily as needed (nerve pain).  01/28/14  Yes Historical Provider, MD  ipratropium (ATROVENT) 0.02 % nebulizer solution Take 0.5 mg by nebulization 3 (three) times daily as needed for wheezing or shortness of breath. Mix with xopenex   Yes Historical Provider, MD  levalbuterol (XOPENEX HFA) 45 MCG/ACT inhaler Inhale 4 puffs into the lungs 4 (four) times daily as needed for wheezing.   Yes Historical Provider, MD  levalbuterol Penne Lash) 0.63 MG/3ML nebulizer solution Take 0.63 mg by nebulization 3 (three) times daily as needed for wheezing or shortness of breath. Mix with ipratropium   Yes Historical Provider, MD  loratadine (CLARITIN) 10 MG tablet Take 10 mg by mouth at bedtime.   Yes Historical Provider, MD  Multiple Vitamin (MULTIVITAMIN WITH MINERALS) TABS tablet Take 1 tablet by mouth at bedtime.   Yes Historical Provider, MD  mupirocin ointment (  BACTROBAN) 2 % Place 1 application into the nose 2 (two) times daily as needed (wound care (toes)).   Yes Historical Provider, MD  naproxen sodium (ALEVE) 220 MG tablet Take 600 mg by mouth daily as needed (pain).   Yes Historical Provider, MD  omeprazole (PRILOSEC) 40 MG capsule Take 40 mg by mouth at bedtime.   Yes Historical Provider, MD  ondansetron (ZOFRAN) 4 MG tablet Take 4 mg by mouth every 6 (six) hours as needed for nausea or vomiting.   Yes Historical Provider, MD  Polyvinyl Alcohol-Povidone (REFRESH OP) Place 1 drop into both eyes at bedtime.   Yes Historical Provider, MD  raltegravir (ISENTRESS) 400 MG tablet Take 800 mg by mouth at bedtime.   Yes Historical Provider, MD  ritonavir (NORVIR) 100 MG TABS tablet Take 100 mg by mouth at bedtime.   Yes Historical Provider, MD  tizanidine (ZANAFLEX) 6 MG capsule Take 6 mg by mouth 3 (three) times daily as needed for muscle spasms.   Yes Historical Provider, MD  traZODone (DESYREL) 50 MG tablet Take 50 mg by mouth at bedtime as needed for sleep.   Yes  Historical Provider, MD   BP 127/76  Pulse 89  Temp(Src) 98.5 F (36.9 C) (Oral)  Resp 17  Ht 5\' 9"  (1.753 m)  Wt 212 lb (96.163 kg)  BMI 31.29 kg/m2  SpO2 95% Physical Exam  Nursing note and vitals reviewed. Constitutional: He is oriented to person, place, and time. He appears well-developed and well-nourished. No distress.  HENT:  Head: Normocephalic and atraumatic.  Right Ear: External ear normal.  Left Ear: External ear normal.  Nose: Nose normal.  Mouth/Throat: Oropharynx is clear and moist. No oropharyngeal exudate.  Eyes: Conjunctivae and EOM are normal. Pupils are equal, round, and reactive to light. Right eye exhibits no discharge. Left eye exhibits no discharge. No scleral icterus.  Neck: Normal range of motion. Neck supple. No JVD present. No spinous process tenderness present. No rigidity. No tracheal deviation and normal range of motion present. No Brudzinski's sign and no Kernig's sign noted. No thyromegaly present.  Cardiovascular: Normal rate, regular rhythm, normal heart sounds and intact distal pulses.  Exam reveals no gallop and no friction rub.   No murmur heard. Pulmonary/Chest: Effort normal. No stridor. No respiratory distress. He has decreased breath sounds. He has no wheezes. He has rhonchi in the left lower field. He has no rales. He exhibits no tenderness.  Abdominal: Soft. Bowel sounds are normal. He exhibits no distension. There is no tenderness. There is no rebound and no guarding.  Musculoskeletal: Normal range of motion. He exhibits no edema.       Lumbar back: He exhibits tenderness and pain.  Lymphadenopathy:    He has no cervical adenopathy.  Neurological: He is alert and oriented to person, place, and time.  Skin: Skin is warm and dry. No rash noted. He is not diaphoretic. No erythema. No pallor.  Psychiatric: He has a normal mood and affect. His behavior is normal. Judgment and thought content normal.    ED Course  Procedures (including  critical care time) Labs Review Labs Reviewed  COMPREHENSIVE METABOLIC PANEL - Abnormal; Notable for the following:    GFR calc non Af Amer 64 (*)    GFR calc Af Amer 74 (*)    All other components within normal limits  CBC WITH DIFFERENTIAL - Abnormal; Notable for the following:    RBC 3.70 (*)    Hemoglobin 8.4 (*)  HCT 28.0 (*)    MCV 75.7 (*)    MCH 22.7 (*)    RDW 22.2 (*)    Eosinophils Relative 13 (*)    Eosinophils Absolute 0.9 (*)    All other components within normal limits  URINE CULTURE  URINALYSIS, ROUTINE W REFLEX MICROSCOPIC  CBC  BASIC METABOLIC PANEL    Imaging Review Dg Chest 2 View  02/01/2014   CLINICAL DATA:  Short of breath and headache  EXAM: CHEST  2 VIEW  COMPARISON:  05/31/2013  FINDINGS: The heart size and mediastinal contours are within normal limits. Both lungs are clear. The visualized skeletal structures are unremarkable.  IMPRESSION: No active cardiopulmonary disease.   Electronically Signed   By: Franchot Gallo M.D.   On: 02/01/2014 19:07   Dg Lumbar Spine 2-3 Views  02/01/2014   CLINICAL DATA:  Low back pain  EXAM: LUMBAR SPINE - 2-3 VIEW  COMPARISON:  Lumbar MRI 06/02/2013  FINDINGS: Normal lumbar alignment. Negative for fracture or pars defect. Mild to moderate disc degeneration L3-4 as noted on the MRI.  IMPRESSION: No acute abnormality.   Electronically Signed   By: Franchot Gallo M.D.   On: 02/01/2014 19:08   Ct Head Wo Contrast  02/01/2014   CLINICAL DATA:  Headaches  EXAM: CT HEAD WITHOUT CONTRAST  TECHNIQUE: Contiguous axial images were obtained from the base of the skull through the vertex without intravenous contrast.  COMPARISON:  10/31/2012  FINDINGS: The bony calvarium is intact. The ventricles are of normal size and configuration. No findings to suggest acute hemorrhage, acute infarction or space-occupying mass lesion are noted.  IMPRESSION: No acute intracranial abnormality is noted.   Electronically Signed   By: Inez Catalina M.D.   On:  02/01/2014 19:30   Ct Angio Chest Pe W/cm &/or Wo Cm  02/02/2014   CLINICAL DATA:  Concern for pulmonary embolism.  Hypoxic episode.  EXAM: CT ANGIOGRAPHY CHEST  CT ABDOMEN AND PELVIS WITH CONTRAST  TECHNIQUE: Multidetector CT imaging of the chest was performed using the standard protocol during bolus administration of intravenous contrast. Multiplanar CT image reconstructions and MIPs were obtained to evaluate the vascular anatomy. Multidetector CT imaging of the abdomen and pelvis was performed using the standard protocol during bolus administration of intravenous contrast.  CONTRAST:  163mL OMNIPAQUE IOHEXOL 350 MG/ML SOLN  COMPARISON:  09/09/2013 abdominal CT.  FINDINGS: CTA CHEST FINDINGS  THORACIC INLET/BODY WALL:  No acute abnormality. No change in a prominent left axillary lymph node compared to PET-CT 03/31/2013.  MEDIASTINUM:  Normal heart size. No pericardial effusion. No acute vascular abnormality, including pulmonary embolism to the level of the segmental pulmonary arteries. No adenopathy.  LUNG WINDOWS:  Diffuse bronchial wall thickening with intermittent mucoid impaction. Mild ground-glass density in the upper lungs which is nonspecific and likely stable from 2014. No consolidation. No effusion. No suspicious pulmonary nodule. Calcified pulmonary nodule in the right upper lobe.  OSSEOUS:  Small medullary sclerotic foci in the left sixth and seventh ribs are nonspecific but stable from 2014.  CT ABDOMEN and PELVIS FINDINGS  BODY WALL: Unremarkable.  ABDOMEN/PELVIS:  Liver: No focal abnormality.  Biliary: Cholelithiasis.  No evidence of acute cholecystitis.  Pancreas: Unremarkable.  Spleen: Unremarkable.  Adrenals: 16 mm nodule in the right adrenal gland was low-density on preceding nonenhanced CT, consistent with adenoma.  Kidneys and ureters: Bilateral renal cysts. No evidence of solid mass. No hydronephrosis.  Bladder: Chronic circumferential wall thickening.  Reproductive: Prostatectomy. No  concerning  nodular tissue in the resection bed. No pelvic lymphadenopathy.  Bowel: No obstruction. Negative appendix.  Retroperitoneum: No mass or adenopathy.  Peritoneum: No ascites or pneumoperitoneum.  Vascular: No acute abnormality.  OSSEOUS: No acute abnormalities.  Review of the MIP images confirms the above findings.  IMPRESSION: 1. No acute intra-abdominal findings. 2. Negative for pulmonary embolism to the segmental level. 3. Bronchitis. 4. Chronic findings are stable from prior noted above.   Electronically Signed   By: Jorje Guild M.D.   On: 02/02/2014 01:03   Ct Abdomen Pelvis W Contrast  02/02/2014   CLINICAL DATA:  Concern for pulmonary embolism.  Hypoxic episode.  EXAM: CT ANGIOGRAPHY CHEST  CT ABDOMEN AND PELVIS WITH CONTRAST  TECHNIQUE: Multidetector CT imaging of the chest was performed using the standard protocol during bolus administration of intravenous contrast. Multiplanar CT image reconstructions and MIPs were obtained to evaluate the vascular anatomy. Multidetector CT imaging of the abdomen and pelvis was performed using the standard protocol during bolus administration of intravenous contrast.  CONTRAST:  171mL OMNIPAQUE IOHEXOL 350 MG/ML SOLN  COMPARISON:  09/09/2013 abdominal CT.  FINDINGS: CTA CHEST FINDINGS  THORACIC INLET/BODY WALL:  No acute abnormality. No change in a prominent left axillary lymph node compared to PET-CT 03/31/2013.  MEDIASTINUM:  Normal heart size. No pericardial effusion. No acute vascular abnormality, including pulmonary embolism to the level of the segmental pulmonary arteries. No adenopathy.  LUNG WINDOWS:  Diffuse bronchial wall thickening with intermittent mucoid impaction. Mild ground-glass density in the upper lungs which is nonspecific and likely stable from 2014. No consolidation. No effusion. No suspicious pulmonary nodule. Calcified pulmonary nodule in the right upper lobe.  OSSEOUS:  Small medullary sclerotic foci in the left sixth and seventh  ribs are nonspecific but stable from 2014.  CT ABDOMEN and PELVIS FINDINGS  BODY WALL: Unremarkable.  ABDOMEN/PELVIS:  Liver: No focal abnormality.  Biliary: Cholelithiasis.  No evidence of acute cholecystitis.  Pancreas: Unremarkable.  Spleen: Unremarkable.  Adrenals: 16 mm nodule in the right adrenal gland was low-density on preceding nonenhanced CT, consistent with adenoma.  Kidneys and ureters: Bilateral renal cysts. No evidence of solid mass. No hydronephrosis.  Bladder: Chronic circumferential wall thickening.  Reproductive: Prostatectomy. No concerning nodular tissue in the resection bed. No pelvic lymphadenopathy.  Bowel: No obstruction. Negative appendix.  Retroperitoneum: No mass or adenopathy.  Peritoneum: No ascites or pneumoperitoneum.  Vascular: No acute abnormality.  OSSEOUS: No acute abnormalities.  Review of the MIP images confirms the above findings.  IMPRESSION: 1. No acute intra-abdominal findings. 2. Negative for pulmonary embolism to the segmental level. 3. Bronchitis. 4. Chronic findings are stable from prior noted above.   Electronically Signed   By: Jorje Guild M.D.   On: 02/02/2014 01:03     EKG Interpretation None      MDM   Final diagnoses:  HCAP (healthcare-associated pneumonia)  COPD exacerbation    Due to patient's prior history of prostate cancer, would be concern for PE as an underlying cause of his shortness of breath and possible desaturation episode. In addition would be concerned that his low back pain could be due to recurrence of his prostate cancer and possible spread to his lumbar spine. We'll obtain screening plain films and basic labs. In the interim we'll treat the patient's symptoms. Initially no significant findings on workup, therefore we'll obtain CT PE study of the chest, and CT of the abdomen with infusion. Patient had a somewhat improved with medications. He does not  have any meningeal signs or other signs concerning for meningitis. We'll give  additional medications for his headache and reevaluate following additional imaging studies.  Patient's headache is much improved following initial medications. His CT scan is unrevealing with the exception of signs of bronchitis. Of note patient does have some rhonchi in the left lower lung bases. Presentation is consistent with either COPD exacerbation or pneumonia. Would favor pneumonia as  the patient is not having significant wheezing, in addition there is peribronchial thickening on CT and increased sputum production which would be more consistent with HCAP pneumonia.   Will minister oral prednisone and start the patient on IV Levaquin which was discussed with inpatient team. Patient will be admitted for further management.  Patient care was discussed with my attending, Dr. Jeanell Sparrow.     Hoyle Sauer, MD 02/02/14 [phone]

## 2014-02-01 NOTE — ED Notes (Signed)
Pt. Stated, I was cutting grass on Wednesday and felt my back hurt a little and then started getting worse . My head started hurting, my breath seemed short and the inhalers were not working.

## 2014-02-01 NOTE — ED Notes (Signed)
Pt back from x-ray.

## 2014-02-01 NOTE — ED Notes (Signed)
Lab at bedside

## 2014-02-01 NOTE — ED Notes (Signed)
Pt to xray at this time.

## 2014-02-02 ENCOUNTER — Encounter (HOSPITAL_COMMUNITY): Payer: Self-pay | Admitting: Radiology

## 2014-02-02 ENCOUNTER — Emergency Department (HOSPITAL_COMMUNITY): Payer: Medicare Other

## 2014-02-02 ENCOUNTER — Inpatient Hospital Stay (HOSPITAL_COMMUNITY): Payer: Medicare Other

## 2014-02-02 DIAGNOSIS — K3189 Other diseases of stomach and duodenum: Secondary | ICD-10-CM | POA: Diagnosis present

## 2014-02-02 DIAGNOSIS — Z87891 Personal history of nicotine dependence: Secondary | ICD-10-CM | POA: Diagnosis not present

## 2014-02-02 DIAGNOSIS — D649 Anemia, unspecified: Secondary | ICD-10-CM | POA: Diagnosis present

## 2014-02-02 DIAGNOSIS — R109 Unspecified abdominal pain: Secondary | ICD-10-CM

## 2014-02-02 DIAGNOSIS — M545 Low back pain, unspecified: Secondary | ICD-10-CM

## 2014-02-02 DIAGNOSIS — B20 Human immunodeficiency virus [HIV] disease: Secondary | ICD-10-CM

## 2014-02-02 DIAGNOSIS — Z8614 Personal history of Methicillin resistant Staphylococcus aureus infection: Secondary | ICD-10-CM | POA: Diagnosis not present

## 2014-02-02 DIAGNOSIS — M549 Dorsalgia, unspecified: Secondary | ICD-10-CM | POA: Diagnosis not present

## 2014-02-02 DIAGNOSIS — J209 Acute bronchitis, unspecified: Secondary | ICD-10-CM | POA: Diagnosis not present

## 2014-02-02 DIAGNOSIS — N182 Chronic kidney disease, stage 2 (mild): Secondary | ICD-10-CM | POA: Diagnosis present

## 2014-02-02 DIAGNOSIS — F0781 Postconcussional syndrome: Secondary | ICD-10-CM | POA: Diagnosis present

## 2014-02-02 DIAGNOSIS — J40 Bronchitis, not specified as acute or chronic: Secondary | ICD-10-CM | POA: Diagnosis not present

## 2014-02-02 DIAGNOSIS — J45901 Unspecified asthma with (acute) exacerbation: Secondary | ICD-10-CM | POA: Diagnosis not present

## 2014-02-02 DIAGNOSIS — R51 Headache: Secondary | ICD-10-CM

## 2014-02-02 DIAGNOSIS — Z21 Asymptomatic human immunodeficiency virus [HIV] infection status: Secondary | ICD-10-CM | POA: Diagnosis not present

## 2014-02-02 DIAGNOSIS — R072 Precordial pain: Secondary | ICD-10-CM | POA: Diagnosis present

## 2014-02-02 DIAGNOSIS — Z8546 Personal history of malignant neoplasm of prostate: Secondary | ICD-10-CM | POA: Diagnosis not present

## 2014-02-02 DIAGNOSIS — G44309 Post-traumatic headache, unspecified, not intractable: Secondary | ICD-10-CM | POA: Diagnosis present

## 2014-02-02 DIAGNOSIS — I129 Hypertensive chronic kidney disease with stage 1 through stage 4 chronic kidney disease, or unspecified chronic kidney disease: Secondary | ICD-10-CM | POA: Diagnosis present

## 2014-02-02 DIAGNOSIS — IMO0001 Reserved for inherently not codable concepts without codable children: Secondary | ICD-10-CM | POA: Diagnosis not present

## 2014-02-02 DIAGNOSIS — R0602 Shortness of breath: Secondary | ICD-10-CM | POA: Diagnosis not present

## 2014-02-02 DIAGNOSIS — R110 Nausea: Secondary | ICD-10-CM | POA: Diagnosis not present

## 2014-02-02 DIAGNOSIS — I10 Essential (primary) hypertension: Secondary | ICD-10-CM

## 2014-02-02 DIAGNOSIS — C61 Malignant neoplasm of prostate: Secondary | ICD-10-CM

## 2014-02-02 DIAGNOSIS — Z7982 Long term (current) use of aspirin: Secondary | ICD-10-CM | POA: Diagnosis not present

## 2014-02-02 DIAGNOSIS — J441 Chronic obstructive pulmonary disease with (acute) exacerbation: Secondary | ICD-10-CM | POA: Diagnosis present

## 2014-02-02 DIAGNOSIS — N281 Cyst of kidney, acquired: Secondary | ICD-10-CM | POA: Diagnosis not present

## 2014-02-02 DIAGNOSIS — R079 Chest pain, unspecified: Secondary | ICD-10-CM | POA: Diagnosis not present

## 2014-02-02 DIAGNOSIS — Z9079 Acquired absence of other genital organ(s): Secondary | ICD-10-CM | POA: Diagnosis not present

## 2014-02-02 DIAGNOSIS — J189 Pneumonia, unspecified organism: Secondary | ICD-10-CM | POA: Insufficient documentation

## 2014-02-02 LAB — CBC
HCT: 28.3 % — ABNORMAL LOW (ref 39.0–52.0)
Hemoglobin: 8.3 g/dL — ABNORMAL LOW (ref 13.0–17.0)
MCH: 23.1 pg — ABNORMAL LOW (ref 26.0–34.0)
MCHC: 29.3 g/dL — ABNORMAL LOW (ref 30.0–36.0)
MCV: 78.6 fL (ref 78.0–100.0)
Platelets: 202 10*3/uL (ref 150–400)
RBC: 3.6 MIL/uL — ABNORMAL LOW (ref 4.22–5.81)
RDW: 23.3 % — ABNORMAL HIGH (ref 11.5–15.5)
WBC: 5.2 10*3/uL (ref 4.0–10.5)

## 2014-02-02 LAB — BASIC METABOLIC PANEL
Anion gap: 11 (ref 5–15)
BUN: 10 mg/dL (ref 6–23)
CO2: 24 mEq/L (ref 19–32)
Calcium: 8.7 mg/dL (ref 8.4–10.5)
Chloride: 103 mEq/L (ref 96–112)
Creatinine, Ser: 1.27 mg/dL (ref 0.50–1.35)
GFR calc Af Amer: 75 mL/min — ABNORMAL LOW (ref >90)
GFR calc non Af Amer: 64 mL/min — ABNORMAL LOW (ref >90)
Glucose, Bld: 112 mg/dL — ABNORMAL HIGH (ref 70–99)
Potassium: 3.9 mEq/L (ref 3.7–5.3)
Sodium: 138 mEq/L (ref 137–147)

## 2014-02-02 LAB — TROPONIN I
Troponin I: <0.3 ng/mL (ref <0.30)
Troponin I: <0.3 ng/mL (ref <0.30)

## 2014-02-02 MED ORDER — EMTRICITABINE-TENOFOVIR DF 200-300 MG PO TABS
1.0000 | ORAL_TABLET | Freq: Every day | ORAL | Status: DC
  Administered 2014-02-02: 1 via ORAL
  Filled 2014-02-02 (×2): qty 1

## 2014-02-02 MED ORDER — RALTEGRAVIR POTASSIUM 400 MG PO TABS
800.0000 mg | ORAL_TABLET | Freq: Every day | ORAL | Status: DC
  Administered 2014-02-02: 800 mg via ORAL
  Filled 2014-02-02 (×2): qty 2

## 2014-02-02 MED ORDER — ALBUTEROL SULFATE (2.5 MG/3ML) 0.083% IN NEBU
2.5000 mg | INHALATION_SOLUTION | RESPIRATORY_TRACT | Status: DC | PRN
  Administered 2014-02-02: 2.5 mg via RESPIRATORY_TRACT

## 2014-02-02 MED ORDER — DIPHENHYDRAMINE HCL 50 MG/ML IJ SOLN
25.0000 mg | Freq: Once | INTRAMUSCULAR | Status: AC
  Administered 2014-02-02: 25 mg via INTRAVENOUS
  Filled 2014-02-02: qty 1

## 2014-02-02 MED ORDER — ONDANSETRON HCL 4 MG PO TABS: 4.0000 mg | ORAL_TABLET | Freq: Four times a day (QID) | ORAL | Status: DC | PRN

## 2014-02-02 MED ORDER — ALBUTEROL SULFATE (2.5 MG/3ML) 0.083% IN NEBU
INHALATION_SOLUTION | RESPIRATORY_TRACT | Status: AC
  Administered 2014-02-02: 22:00:00
  Filled 2014-02-02: qty 3

## 2014-02-02 MED ORDER — FLUTICASONE PROPIONATE HFA 220 MCG/ACT IN AERO: 1.0000 | INHALATION_SPRAY | RESPIRATORY_TRACT | Status: DC | PRN

## 2014-02-02 MED ORDER — GABAPENTIN 300 MG PO CAPS
300.0000 mg | ORAL_CAPSULE | Freq: Four times a day (QID) | ORAL | Status: DC | PRN
  Filled 2014-02-02: qty 1

## 2014-02-02 MED ORDER — ACETAMINOPHEN 650 MG RE SUPP: 650.0000 mg | Freq: Four times a day (QID) | RECTAL | Status: DC | PRN

## 2014-02-02 MED ORDER — ASPIRIN EC 81 MG PO TBEC
81.0000 mg | DELAYED_RELEASE_TABLET | ORAL | Status: DC
  Administered 2014-02-02: 81 mg via ORAL
  Filled 2014-02-02 (×2): qty 1

## 2014-02-02 MED ORDER — DIPHENHYDRAMINE HCL 50 MG/ML IJ SOLN
12.5000 mg | Freq: Once | INTRAMUSCULAR | Status: AC
  Administered 2014-02-02: 12.5 mg via INTRAVENOUS
  Filled 2014-02-02: qty 1

## 2014-02-02 MED ORDER — ALBUTEROL SULFATE (5 MG/ML) 0.5% IN NEBU: 2.5000 mg | INHALATION_SOLUTION | RESPIRATORY_TRACT | Status: DC | PRN

## 2014-02-02 MED ORDER — DARUNAVIR ETHANOLATE 800 MG PO TABS
800.0000 mg | ORAL_TABLET | Freq: Every day | ORAL | Status: DC
  Administered 2014-02-02: 800 mg via ORAL
  Filled 2014-02-02 (×2): qty 1

## 2014-02-02 MED ORDER — IPRATROPIUM BROMIDE 0.02 % IN SOLN: 0.5000 mg | RESPIRATORY_TRACT | Status: DC

## 2014-02-02 MED ORDER — SODIUM CHLORIDE 0.9 % IV SOLN
INTRAVENOUS | Status: AC
  Administered 2014-02-02: 14:00:00 via INTRAVENOUS

## 2014-02-02 MED ORDER — GUAIFENESIN ER 600 MG PO TB12
600.0000 mg | ORAL_TABLET | Freq: Two times a day (BID) | ORAL | Status: DC
  Administered 2014-02-02 – 2014-02-03 (×4): 600 mg via ORAL
  Filled 2014-02-02 (×5): qty 1

## 2014-02-02 MED ORDER — RITONAVIR 100 MG PO TABS
100.0000 mg | ORAL_TABLET | Freq: Every day | ORAL | Status: DC
  Administered 2014-02-02: 100 mg via ORAL
  Filled 2014-02-02 (×2): qty 1

## 2014-02-02 MED ORDER — IPRATROPIUM BROMIDE 0.02 % IN SOLN: 0.5000 mg | RESPIRATORY_TRACT | Status: DC | PRN

## 2014-02-02 MED ORDER — IOHEXOL 350 MG/ML SOLN
100.0000 mL | Freq: Once | INTRAVENOUS | Status: AC | PRN
  Administered 2014-02-02: 160 mL via INTRAVENOUS

## 2014-02-02 MED ORDER — GUAIFENESIN-DM 100-10 MG/5ML PO SYRP
5.0000 mL | ORAL_SOLUTION | ORAL | Status: DC | PRN
  Filled 2014-02-02: qty 5

## 2014-02-02 MED ORDER — HYDROMORPHONE HCL PF 1 MG/ML IJ SOLN
0.2500 mg | Freq: Once | INTRAMUSCULAR | Status: AC
  Administered 2014-02-02: 0.25 mg via INTRAVENOUS
  Filled 2014-02-02: qty 1

## 2014-02-02 MED ORDER — LEVOFLOXACIN IN D5W 750 MG/150ML IV SOLN
750.0000 mg | Freq: Once | INTRAVENOUS | Status: AC
  Administered 2014-02-02: 750 mg via INTRAVENOUS
  Filled 2014-02-02: qty 150

## 2014-02-02 MED ORDER — ALBUTEROL SULFATE (2.5 MG/3ML) 0.083% IN NEBU: 3.0000 mL | INHALATION_SOLUTION | RESPIRATORY_TRACT | Status: DC

## 2014-02-02 MED ORDER — ONDANSETRON HCL 4 MG/2ML IJ SOLN
4.0000 mg | Freq: Four times a day (QID) | INTRAMUSCULAR | Status: DC | PRN
  Administered 2014-02-02 (×2): 4 mg via INTRAVENOUS
  Filled 2014-02-02 (×2): qty 2

## 2014-02-02 MED ORDER — SODIUM CHLORIDE 0.9 % IV BOLUS (SEPSIS): 1000.0000 mL | Freq: Once | INTRAVENOUS | Status: DC

## 2014-02-02 MED ORDER — IPRATROPIUM-ALBUTEROL 0.5-2.5 (3) MG/3ML IN SOLN
3.0000 mL | RESPIRATORY_TRACT | Status: DC
  Administered 2014-02-03 (×4): 3 mL via RESPIRATORY_TRACT
  Filled 2014-02-02 (×4): qty 3

## 2014-02-02 MED ORDER — KETOROLAC TROMETHAMINE 15 MG/ML IJ SOLN
15.0000 mg | Freq: Once | INTRAMUSCULAR | Status: AC
  Administered 2014-02-02: 15 mg via INTRAVENOUS
  Filled 2014-02-02: qty 1

## 2014-02-02 MED ORDER — LORATADINE 10 MG PO TABS
10.0000 mg | ORAL_TABLET | Freq: Every day | ORAL | Status: DC
  Administered 2014-02-02: 10 mg via ORAL
  Filled 2014-02-02 (×2): qty 1

## 2014-02-02 MED ORDER — DIPHENHYDRAMINE HCL 50 MG/ML IJ SOLN: 12.5000 mg | Freq: Three times a day (TID) | INTRAMUSCULAR | Status: DC | PRN

## 2014-02-02 MED ORDER — TIZANIDINE HCL 2 MG PO TABS
6.0000 mg | ORAL_TABLET | Freq: Three times a day (TID) | ORAL | Status: DC | PRN
  Administered 2014-02-02: 6 mg via ORAL
  Filled 2014-02-02 (×2): qty 1

## 2014-02-02 MED ORDER — DOXYCYCLINE HYCLATE 100 MG PO TABS
100.0000 mg | ORAL_TABLET | Freq: Two times a day (BID) | ORAL | Status: DC
  Administered 2014-02-02 – 2014-02-03 (×3): 100 mg via ORAL
  Filled 2014-02-02 (×5): qty 1

## 2014-02-02 MED ORDER — PREDNISONE 20 MG PO TABS
60.0000 mg | ORAL_TABLET | Freq: Once | ORAL | Status: AC
  Administered 2014-02-02: 60 mg via ORAL
  Filled 2014-02-02: qty 3

## 2014-02-02 MED ORDER — ALBUTEROL SULFATE (2.5 MG/3ML) 0.083% IN NEBU: 3.0000 mL | INHALATION_SOLUTION | Freq: Four times a day (QID) | RESPIRATORY_TRACT | Status: DC | PRN

## 2014-02-02 MED ORDER — PROCHLORPERAZINE EDISYLATE 5 MG/ML IJ SOLN
10.0000 mg | Freq: Once | INTRAMUSCULAR | Status: DC
  Filled 2014-02-02: qty 2

## 2014-02-02 MED ORDER — PANTOPRAZOLE SODIUM 40 MG PO TBEC
40.0000 mg | DELAYED_RELEASE_TABLET | Freq: Every day | ORAL | Status: DC
  Administered 2014-02-02 – 2014-02-03 (×2): 40 mg via ORAL
  Filled 2014-02-02: qty 1

## 2014-02-02 MED ORDER — ACETAMINOPHEN 325 MG PO TABS: 650.0000 mg | ORAL_TABLET | Freq: Four times a day (QID) | ORAL | Status: DC | PRN

## 2014-02-02 MED ORDER — ENOXAPARIN SODIUM 40 MG/0.4ML ~~LOC~~ SOLN
40.0000 mg | SUBCUTANEOUS | Status: DC
  Administered 2014-02-02 – 2014-02-03 (×2): 40 mg via SUBCUTANEOUS
  Filled 2014-02-02 (×2): qty 0.4

## 2014-02-02 MED ORDER — SODIUM CHLORIDE 0.9 % IJ SOLN
3.0000 mL | Freq: Two times a day (BID) | INTRAMUSCULAR | Status: DC
  Administered 2014-02-02 (×3): 3 mL via INTRAVENOUS

## 2014-02-02 NOTE — Progress Notes (Signed)
Subjective: Patient was feeling better this morning. Sister reported to [place] that his documented hypertension was an error, as he has never had a history of hypertension. He has been having a 5-6/10 headache this afternoon and has needed prochlorperazine and benadryl 25 mg, as well as ketorolac 15 mg. Patient is requesting home dose of zanaflex 6 mg and gabapentin 400 mg PRN for the head aches.   Objective: Vital signs in last 24 hours: Filed Vitals:   02/02/14 0140 02/02/14 0332 02/02/14 0730 02/02/14 1313  BP:  125/73 126/73 115/71  Pulse:  81 77 91  Temp:  98.4 F (36.9 C) 98.3 F (36.8 C) 98.5 F (36.9 C)  TempSrc:  Oral Oral Oral  Resp:  14 14 14   Height:  5\' 9"  (1.753 m)    Weight:  96.843 kg (213 lb 8 oz)    SpO2: 93% 91% 93% 89%   Physical exam: General: Patient seemed fatigued but in no acute distress and able to converse, sister was present and spoke for the patient CV: RRR, nl S1/S2, no m/r/g Pulm: Normal work of breathing, short inspirations, bibasilar crackles present  Lab Results: Basic Metabolic Panel:  Recent Labs  02/01/14 1813 02/02/14 0425  NA 140 138  K 4.0 3.9  CL 104 103  CO2 26 24  GLUCOSE 92 112*  BUN 12 10  CREATININE 1.28 1.27  CALCIUM 8.9 8.7   Liver Function Tests:  Recent Labs  02/01/14 1813  AST 17  ALT 14  ALKPHOS 108  BILITOT 1.2  PROT 7.0  ALBUMIN 3.8   CBC:  Recent Labs  02/01/14 1813 02/02/14 0425  WBC 6.7 5.2  NEUTROABS 3.1  --   HGB 8.4* 8.3*  HCT 28.0* 28.3*  MCV 75.7* 78.6  PLT 242 202    Urinalysis:  Recent Labs  02/01/14 1822  COLORURINE YELLOW  LABSPEC 1.008  PHURINE 6.0  GLUCOSEU NEGATIVE  HGBUR NEGATIVE  BILIRUBINUR NEGATIVE  KETONESUR NEGATIVE  PROTEINUR NEGATIVE  UROBILINOGEN 1.0  NITRITE NEGATIVE  LEUKOCYTESUR NEGATIVE   Studies/Results: Dg Chest 2 View: 02/01/2014    FINDINGS: The heart size and mediastinal contours are within normal limits. Both lungs are clear. The visualized skeletal  structures are unremarkable.   IMPRESSION: No active cardiopulmonary disease.  Dg Lumbar Spine 2-3 Views: 02/01/2014    FINDINGS: Normal lumbar alignment. Negative for fracture or pars defect. Mild to moderate disc degeneration L3-4 as noted on the MRI.   IMPRESSION: No acute abnormality.  Ct Head Wo Contrast: 02/01/2014    FINDINGS: The bony calvarium is intact. The ventricles are of normal size and configuration. No findings to suggest acute hemorrhage, acute infarction or space-occupying mass lesion are noted.   IMPRESSION: No acute intracranial abnormality is noted.  Ct Angio Chest Pe W/cm &/or Wo Cm: 02/02/2014    1. No acute intra-abdominal findings.  2. Negative for pulmonary embolism to the segmental level.  3. Bronchitis.  4. Chronic findings are stable from prior  Ct Abdomen Pelvis W Contrast: 02/02/2014    1. No acute intra-abdominal findings.  2. Negative for pulmonary embolism to the segmental level.  3. Bronchitis.  4. Chronic findings are stable from prior  Medications: I have reviewed the patient's current medications. Scheduled Meds: . aspirin EC  81 mg Oral Once per day on Mon Wed Fri  . Darunavir Ethanolate  800 mg Oral QHS  . doxycycline  100 mg Oral Q12H  . emtricitabine-tenofovir  1 tablet Oral QHS  .  enoxaparin (LOVENOX) injection  40 mg Subcutaneous Q24H  . guaiFENesin  600 mg Oral BID  . loratadine  10 mg Oral QHS  . pantoprazole  40 mg Oral Daily  . prochlorperazine  10 mg Intravenous Once  . raltegravir  800 mg Oral QHS  . ritonavir  100 mg Oral QHS  . sodium chloride  3 mL Intravenous Q12H   Continuous Infusions: . sodium chloride     PRN Meds:.acetaminophen, acetaminophen, albuterol, guaiFENesin-dextromethorphan, ipratropium, ondansetron (ZOFRAN) IV, ondansetron  Assessment/Plan: Principal Problem:   COPD exacerbation Active Problems:   HIV INFECTION   Bronchitis, acute   Prostate cancer  Casey Brennan is a 53 y.o. male with a history of  well-controlled HIV (1988), asthma, prostate cancer s/p prostatectomy (08/2013), post concussion syndrome, severe frontal headaches who presents with shortness of breath, gradual headache with photophobia, and low back pain not correctable by inalers after cutting grass on 7/29.   Shortness of breath/Headache/Back pain:  Leading diagnosis for this constellation of symptoms is a COPD exacerbation. ED workup showed CT head, CT lumbar spine, CTA chest with and without contrast, and CT Abdomen with contrast negative which combined with patient's presentation, makes pneumonia, PE, meningitis, and prostate cancer metastasis unlikely.   The patient received 1.4 L NS yesterday due to elevated BUN, but will receive more to try to protect his kidneys from the large amount of contrast he received yesterday during work up.   We are planning to discontinue fluticasone because of the risk of adrenal insufficiency and the patient is already on Qvar. Patient does not yet carries the diagnosis of asthma despite being on a treatment regimen closer to COPD. He should receive PFTs on clinic follow up to evaluate his function since this last dyspneic event.  Patient had 5-6/10 headaches 02/02/14 and was requesting dilaudid which he received in the ED on admission without incident, despite his recorded allergy to morphine. He received prochlorperazine and benadryl and ketorolac for headaches, however he still complains of a 4/10 severity. On conversation with the patient, we found that zanaflex 6 mg and gabapentin 400 mg has worked in the past for these headaches.  Dilaudid will be given again only as a last resort.  Levaquin is being switched for doxycycline BID for 5-7 days for optimization of therapy. Observe patient on doxycycline for the night with goal discharge tomorrow.  -- NS 100 cc/hr x 10 hrs -- Zanaflex 6 mg TID and Gabapentin 300 mg QID for headaches -- Discontinue fluticasone -- Discontinue Levaquin and switch  to doxycycline BID for 5-7 days -- Schedule PFTs on clinic follow up  Chest pain: Patient received EKG on admission for complaints of substernal chest pain and it showed new ST elevations in leads V3 and V5, so troponins x3 q6hrs were ordered. -- Troponin (-) x1 -- Continue to follow troponins  HIV: Patient's HIV is well controlled with last CD4 430/22% on 10/13/13. -- Continue raltegravir, ritonavir, darunavir, and emtricitabine-tenofovir  Prostate cancer: Patient had a prostatectomy performed in 2015. There was some concern over metastatic cancer to the lumbar spine as the source of the patient's worsened back pain. Imaging was negative for metastases.  DVT Prophylaxis: -- Continue enoxaparin  Dispo: Discharge goal 02/02/14 to home.  This is a Careers information officer Note.  The care of the patient was discussed with Dr. Dellia Nims and the assessment and plan formulated with their assistance.  Please see their attached note for official documentation of the daily encounter.  LOS: 1 day   Tommie Sams, Med Student 02/02/2014, 2:04 PM

## 2014-02-02 NOTE — Care Management Note (Signed)
    Page 1 of 1   02/03/2014     12:11:40 PM CARE MANAGEMENT NOTE 02/03/2014  Patient:  Casey Brennan, Casey Brennan   Account Number:  [id number]  Date Initiated:  02/02/2014  Documentation initiated by:  Tomi Bamberger  Subjective/Objective Assessment:   dx hcap  admit     Action/Plan:   Anticipated DC Date:  02/04/2014   Anticipated DC Plan:  [place]  CM consult      Choice offered to / List presented to:             Status of service:  Completed, signed off Medicare Important Message given?  NA - LOS <3 / Initial given by admissions (If response is "NO", the following Medicare IM given date fields will be blank) Date Medicare IM given:   Medicare IM given by:   Date Additional Medicare IM given:   Additional Medicare IM given by:    Discharge Disposition:  HOME/SELF CARE  Per UR Regulation:  Reviewed for med. necessity/level of care/duration of stay  If discussed at [place] of Stay Meetings, dates discussed:    Comments:  02/03/14 [place], BSN [phone] patient for dc today, no needs antiicpated.

## 2014-02-02 NOTE — H&P (Signed)
Date: 02/02/2014               Patient Name:  Casey Brennan MRN: [id number]  DOB: 1960/10/23 Age / Sex: 53 y.o., male   PCP: Albin Felling, MD         Medical Service: Internal Medicine Teaching Service         Attending Physician: Dr. Carlyle Basques, MD    First Contact: Dr. Genene Churn  Pager: [phone]   Second Contact: Dr. Hayes Ludwig Pager: [phone]       After Hours (After 5p/  First Contact Pager: [phone]  weekends / holidays): Second Contact Pager: [phone]   Chief Complaint: shortness of breath  History of Present Illness: Casey Brennan is a 53 year old man with history of well controlled HIV, prostate cancer s/p robot assisted laparoscopic radical prostatectomy 08/18/2013, HTN, asthma presenting with headache and shortness of breath x 5 days. The headache was located in the parietal area radiating to the frontal region. He denies vision changes. His shortness of breath started shortly after. He took his home QVar, levalbuterol/ipratropium, and Flovent without improvement. He had a temperature of 101.1 yesterday morning, malaise, chest discomfort associated with cough, cough productive of brown mucous, wheezing, nausea, reflux, RUQ abdominal pain, diarrhea on Wednesday, and cold sores. Denies chills, anorexia, orthopnea, emesis, constipation, dysuria, rash.  In the ED, he received benadryl 25mg  IV, dilaudid 1mg  IV x 2, Atrovent/Xopenex 0.5/1.25 mg neb, zofran 4mg  IV, 1L NS bolus. He was found to have bronchitis on CT and given Levaquin 750mg  IV and prednisone 60mg .  Meds: Current Facility-Administered Medications  Medication Dose Route Frequency Provider Last Rate Last Dose  . levofloxacin (LEVAQUIN) IVPB 750 mg  750 mg Intravenous Once Hoyle Sauer, MD 100 mL/hr at 02/02/14 0206 750 mg at 02/02/14 0206  . prochlorperazine (COMPAZINE) injection 10 mg  10 mg Intravenous Q6H PRN Hoyle Sauer, MD       Current Outpatient Prescriptions  Medication Sig Dispense Refill  . aspirin EC 81 MG  tablet Take 81 mg by mouth 3 (three) times a week. Monday, Tuesday and Wednesday      . beclomethasone (QVAR) 80 MCG/ACT inhaler Inhale 1 puff into the lungs 2 (two) times daily as needed (shortness of breath/wheezing).       . Darunavir Ethanolate (PREZISTA) 800 MG tablet Take 800 mg by mouth at bedtime.      . diclofenac sodium (VOLTAREN) 1 % GEL Apply 1 application topically 2 (two) times daily as needed (back and shoulder pain).      [name] emtricitabine-tenofovir (TRUVADA) 200-300 MG per tablet Take 1 tablet by mouth at bedtime.      . fluticasone (FLOVENT HFA) 220 MCG/ACT inhaler Inhale 1 puff into the lungs every 4 (four) hours as needed (shortness of breath/wheezing).      . gabapentin (NEURONTIN) 300 MG capsule Take 300 mg by mouth 4 (four) times daily as needed (nerve pain).       [name] ipratropium (ATROVENT) 0.02 % nebulizer solution Take 0.5 mg by nebulization 3 (three) times daily as needed for wheezing or shortness of breath. Mix with xopenex      . levalbuterol (XOPENEX HFA) 45 MCG/ACT inhaler Inhale 4 puffs into the lungs 4 (four) times daily as needed for wheezing.      . levalbuterol (XOPENEX) 0.63 MG/3ML nebulizer solution Take 0.63 mg by nebulization 3 (three) times daily as needed for wheezing or shortness of breath. Mix with ipratropium      . loratadine (CLARITIN)  10 MG tablet Take 10 mg by mouth at bedtime.      . Multiple Vitamin (MULTIVITAMIN WITH MINERALS) TABS tablet Take 1 tablet by mouth at bedtime.      . mupirocin ointment (BACTROBAN) 2 % Place 1 application into the nose 2 (two) times daily as needed (wound care (toes)).      . naproxen sodium (ALEVE) 220 MG tablet Take 600 mg by mouth daily as needed (pain).      [name] omeprazole (PRILOSEC) 40 MG capsule Take 40 mg by mouth at bedtime.      . ondansetron (ZOFRAN) 4 MG tablet Take 4 mg by mouth every 6 (six) hours as needed for nausea or vomiting.      . Polyvinyl Alcohol-Povidone (REFRESH OP) Place 1 drop into both eyes at  bedtime.      . raltegravir (ISENTRESS) 400 MG tablet Take 800 mg by mouth at bedtime.      . ritonavir (NORVIR) 100 MG TABS tablet Take 100 mg by mouth at bedtime.      . tizanidine (ZANAFLEX) 6 MG capsule Take 6 mg by mouth 3 (three) times daily as needed for muscle spasms.      . traZODone (DESYREL) 50 MG tablet Take 50 mg by mouth at bedtime as needed for sleep.      . [DISCONTINUED] famotidine (PEPCID AC) 10 MG tablet One po bid x 3 days and then prn upper abdominal pain  20 tablet  0    Allergies: Allergies as of 02/01/2014 - Review Complete 02/01/2014  Allergen Reaction Noted  . Morphine and related Shortness Of Breath, Itching, and Other (See Comments) 06/13/2013  . Penicillins Shortness Of Breath and Rash   . Sulfonamide derivatives Shortness Of Breath and Rash    Past Medical History  Diagnosis Date  . HIV (human immunodeficiency virus infection) 1988  . Chronic anemia     normocytic  . Chronic folliculitis   . Gross hematuria   . Elevated PSA   . H/O pericarditis     2010--  myopercarditis--  resolved  . History of concussion     2012  &  2013  RESIDUAL HA'S --  RESOLVED  . Axillary lymphadenopathy   . History of MRSA infection 2010    infected boil  . History of gastric ulcer   . CKD (chronic kidney disease), stage II     nephrologist-  dr patel (secondary to hiv vs htn)  . Bilateral renal cysts   . Lytic bone lesion of hip     WORK-UP DONE BY ONCOLOGIST DR HA --  NOT MALIGNANT  . Wears glasses   . Headache(784.0)     HX SEVERE FRONTAL HA'S  . Prostate cancer 04/25/13    gleason 3+3=6, volume 30 gm  . Ulcer     hx of gastric  . Asthma     very rare  . Post concussion syndrome   . Arthritis     r knee   . Pneumonia     hx of   Past Surgical History  Procedure Laterality Date  . Upper leg soft tissue biopsy  2012    left thigh  . Knee arthroscopy Right 1985  . Colonoscopy  12/26/2011    Procedure: COLONOSCOPY;  Surgeon: Lear Ng, MD;   Location: [facility];  Service: Endoscopy;  Laterality: N/A;  . Esophagogastroduodenoscopy  12/26/2011    Procedure: ESOPHAGOGASTRODUODENOSCOPY (EGD);  Surgeon: Lear Ng, MD;  Location: [facility];  Service:  Endoscopy;  Laterality: N/A;  . Cardiac catheterization  03-23-2009  DR COOPER    NORMAL CORONARY ARTERIES  . Excisional bx left breast mass/  i  &  d left breast abscess  03-24-2009  . Excision chronic left breast abscess  09-21-2010  . Cystoscopy/retrograde/ureteroscopy Bilateral 04/25/2013    Procedure: CYSTOSCOPY/ BILATERAL RETROGRADES; BLADDER BIOPSIES;  Surgeon: Alexis Frock, MD;  Location: [facility];  Service: Urology;  Laterality: Bilateral;  . Prostate biopsy N/A 04/25/2013    Procedure: BIOPSY TRANSRECTAL ULTRASONIC PROSTATE (TUBP);  Surgeon: Alexis Frock, MD;  Location: [facility];  Service: Urology;  Laterality: N/A;  . Left hip biopsy  10/2012  . Left axilla biopsy  04/2013  . Robot assisted laparoscopic radical prostatectomy N/A 08/18/2013    Procedure: ROBOTIC ASSISTED LAPAROSCOPIC RADICAL PROSTATECTOMY;  Surgeon: Alexis Frock, MD;  Location: [facility];  Service: Urology;  Laterality: N/A;  . Lymphadenectomy Bilateral 08/18/2013    Procedure: LYMPHADENECTOMY "PELVIC LYMPH NODE DISSECTION";  Surgeon: Alexis Frock, MD;  Location: [facility];  Service: Urology;  Laterality: Bilateral;   Family History  Problem Relation Age of Onset  . Stroke Father   . Diabetes Father   . Cancer Father     brain cancer  . Hypertension Sister   . Cancer Maternal Uncle     prostate cancer   History   Social History  . Marital Status: Divorced    Spouse Name: N/A    Number of Children: N/A  . 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 Smoking status: Former Smoker -- 1.50 packs/day for 0 years    Types: Cigars, Cigarettes    Quit date: 05/22/2010  .  Smokeless tobacco: Never Used  . Alcohol Use: No  . Drug Use: No  . Sexual Activity: Not Currently    Birth Control/ Protection: Condom     Comment: pt. declined condoms   Other Topics Concern  . Not on file   Social History Narrative   Sister April stays with him currently although he hopes to return to truck driving. 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 Sister is starting medical school next year.    Review of Systems: Constitutional: +fevers, +malaise, denies chills or anorexia Eyes: no vision changes Ears, nose, mouth, throat, and face: +productive cough, +cold sores Respiratory: +shortness of breath, +wheeze, +asthma, denies orthopnea Cardiovascular: no chest pain Gastrointestinal: +nausea, denies vomiting, +abdominal pain, no constipation, +diarrhea Genitourinary: no dysuria, no hematuria Integument: no rash Hematologic/lymphatic: no bleeding/bruising, no edema Musculoskeletal: +lower back pain Neurological: no paresthesias, no weakness  Physical Exam: Blood pressure 134/83, pulse 71, temperature 98.8 F (37.1 C), temperature source Rectal, resp. rate 13, height 5\' 9"  (1.753 m), weight 212 lb (96.163 kg), SpO2 93.00%. BP 134/83  Pulse 71  Temp(Src) 98.8 F (37.1 C) (Rectal)  Resp 13  Ht 5\' 9"  (1.753 m)  Wt 212 lb (96.163 kg)  BMI 31.29 kg/m2  SpO2 93%  General Appearance:    Alert, cooperative, no distress, appears stated age  Head:    Normocephalic, without obvious abnormality, atraumatic  Eyes:    conjunctiva/corneas clear, EOM's intact bilaterally   Ears:    Normal external ear canals, both ears  Nose:   Nares normal, septum midline, mucosa normal, no drainage   Throat:   Lips, mucosa, and normal  Neck:   Supple, symmetrical,  trachea midline, no adenopathy  Lungs:     Expiratory wheezes heard in upper lung fields, clear to auscultation bilaterally otherwise, respirations unlabored  Chest wall:    No tenderness or deformity  Heart:    Regular rate and  rhythm, S1 and S2 normal, no murmur, rub   or gallop  Abdomen:     Soft, non-tender, bowel sounds active all four quadrants,    no masses, no organomegaly  Extremities:   Extremities normal, atraumatic, no cyanosis or edema  Pulses:   2+ and symmetric all extremities  Skin:   Skin color, texture, turgor normal, no rashes or lesions  Neurologic:   No gross deficits    Lab results: Basic Metabolic Panel:  Recent Labs  02/01/14 1813  NA 140  K 4.0  CL 104  CO2 26  GLUCOSE 92  BUN 12  CREATININE 1.28  CALCIUM 8.9   Liver Function Tests:  Recent Labs  02/01/14 1813  AST 17  ALT 14  ALKPHOS 108  BILITOT 1.2  PROT 7.0  ALBUMIN 3.8   CBC:  Recent Labs  02/01/14 1813  WBC 6.7  NEUTROABS 3.1  HGB 8.4*  HCT 28.0*  MCV 75.7*  PLT 242     Urinalysis:  Recent Labs  02/01/14 1822  COLORURINE YELLOW  LABSPEC 1.008  PHURINE 6.0  GLUCOSEU NEGATIVE  HGBUR NEGATIVE  BILIRUBINUR NEGATIVE  KETONESUR NEGATIVE  PROTEINUR NEGATIVE  UROBILINOGEN 1.0  NITRITE NEGATIVE  LEUKOCYTESUR NEGATIVE    Imaging results:  Dg Chest 2 View  02/01/2014   CLINICAL DATA:  Short of breath and headache  EXAM: CHEST  2 VIEW  COMPARISON:  05/31/2013  FINDINGS: The heart size and mediastinal contours are within normal limits. Both lungs are clear. The visualized skeletal structures are unremarkable.  IMPRESSION: No active cardiopulmonary disease.   Electronically Signed   By: Franchot Gallo M.D.   On: 02/01/2014 19:07   Dg Lumbar Spine 2-3 Views  02/01/2014   CLINICAL DATA:  Low back pain  EXAM: LUMBAR SPINE - 2-3 VIEW  COMPARISON:  Lumbar MRI 06/02/2013  FINDINGS: Normal lumbar alignment. Negative for fracture or pars defect. Mild to moderate disc degeneration L3-4 as noted on the MRI.  IMPRESSION: No acute abnormality.   Electronically Signed   By: Franchot Gallo M.D.   On: 02/01/2014 19:08   Ct Head Wo Contrast  02/01/2014   CLINICAL DATA:  Headaches  EXAM: CT HEAD WITHOUT CONTRAST   TECHNIQUE: Contiguous axial images were obtained from the base of the skull through the vertex without intravenous contrast.  COMPARISON:  10/31/2012  FINDINGS: The bony calvarium is intact. The ventricles are of normal size and configuration. No findings to suggest acute hemorrhage, acute infarction or space-occupying mass lesion are noted.  IMPRESSION: No acute intracranial abnormality is noted.   Electronically Signed   By: Inez Catalina M.D.   On: 02/01/2014 19:30   Ct Angio Chest Pe W/cm &/or Wo Cm  02/02/2014   CLINICAL DATA:  Concern for pulmonary embolism.  Hypoxic episode.  EXAM: CT ANGIOGRAPHY CHEST  CT ABDOMEN AND PELVIS WITH CONTRAST  TECHNIQUE: Multidetector CT imaging of the chest was performed using the standard protocol during bolus administration of intravenous contrast. Multiplanar CT image reconstructions and MIPs were obtained to evaluate the vascular anatomy. Multidetector CT imaging of the abdomen and pelvis was performed using the standard protocol during bolus administration of intravenous contrast.  CONTRAST:  141mL OMNIPAQUE IOHEXOL 350 MG/ML SOLN  COMPARISON:  09/09/2013 abdominal CT.  FINDINGS: CTA CHEST FINDINGS  THORACIC INLET/BODY WALL:  No acute abnormality. No change in a prominent left axillary lymph node compared to PET-CT 03/31/2013.  MEDIASTINUM:  Normal heart size. No pericardial effusion. No acute vascular abnormality, including pulmonary embolism to the level of the segmental pulmonary arteries. No adenopathy.  LUNG WINDOWS:  Diffuse bronchial wall thickening with intermittent mucoid impaction. Mild ground-glass density in the upper lungs which is nonspecific and likely stable from 2014. No consolidation. No effusion. No suspicious pulmonary nodule. Calcified pulmonary nodule in the right upper lobe.  OSSEOUS:  Small medullary sclerotic foci in the left sixth and seventh ribs are nonspecific but stable from 2014.  CT ABDOMEN and PELVIS FINDINGS  BODY WALL: Unremarkable.   ABDOMEN/PELVIS:  Liver: No focal abnormality.  Biliary: Cholelithiasis.  No evidence of acute cholecystitis.  Pancreas: Unremarkable.  Spleen: Unremarkable.  Adrenals: 16 mm nodule in the right adrenal gland was low-density on preceding nonenhanced CT, consistent with adenoma.  Kidneys and ureters: Bilateral renal cysts. No evidence of solid mass. No hydronephrosis.  Bladder: Chronic circumferential wall thickening.  Reproductive: Prostatectomy. No concerning nodular tissue in the resection bed. No pelvic lymphadenopathy.  Bowel: No obstruction. Negative appendix.  Retroperitoneum: No mass or adenopathy.  Peritoneum: No ascites or pneumoperitoneum.  Vascular: No acute abnormality.  OSSEOUS: No acute abnormalities.  Review of the MIP images confirms the above findings.  IMPRESSION: 1. No acute intra-abdominal findings. 2. Negative for pulmonary embolism to the segmental level. 3. Bronchitis. 4. Chronic findings are stable from prior noted above.   Electronically Signed   By: Jorje Guild M.D.   On: 02/02/2014 01:03   Ct Abdomen Pelvis W Contrast  02/02/2014   CLINICAL DATA:  Concern for pulmonary embolism.  Hypoxic episode.  EXAM: CT ANGIOGRAPHY CHEST  CT ABDOMEN AND PELVIS WITH CONTRAST  TECHNIQUE: Multidetector CT imaging of the chest was performed using the standard protocol during bolus administration of intravenous contrast. Multiplanar CT image reconstructions and MIPs were obtained to evaluate the vascular anatomy. Multidetector CT imaging of the abdomen and pelvis was performed using the standard protocol during bolus administration of intravenous contrast.  CONTRAST:  154mL OMNIPAQUE IOHEXOL 350 MG/ML SOLN  COMPARISON:  09/09/2013 abdominal CT.  FINDINGS: CTA CHEST FINDINGS  THORACIC INLET/BODY WALL:  No acute abnormality. No change in a prominent left axillary lymph node compared to PET-CT 03/31/2013.  MEDIASTINUM:  Normal heart size. No pericardial effusion. No acute vascular abnormality, including  pulmonary embolism to the level of the segmental pulmonary arteries. No adenopathy.  LUNG WINDOWS:  Diffuse bronchial wall thickening with intermittent mucoid impaction. Mild ground-glass density in the upper lungs which is nonspecific and likely stable from 2014. No consolidation. No effusion. No suspicious pulmonary nodule. Calcified pulmonary nodule in the right upper lobe.  OSSEOUS:  Small medullary sclerotic foci in the left sixth and seventh ribs are nonspecific but stable from 2014.  CT ABDOMEN and PELVIS FINDINGS  BODY WALL: Unremarkable.  ABDOMEN/PELVIS:  Liver: No focal abnormality.  Biliary: Cholelithiasis.  No evidence of acute cholecystitis.  Pancreas: Unremarkable.  Spleen: Unremarkable.  Adrenals: 16 mm nodule in the right adrenal gland was low-density on preceding nonenhanced CT, consistent with adenoma.  Kidneys and ureters: Bilateral renal cysts. No evidence of solid mass. No hydronephrosis.  Bladder: Chronic circumferential wall thickening.  Reproductive: Prostatectomy. No concerning nodular tissue in the resection bed. No pelvic lymphadenopathy.  Bowel: No obstruction. Negative appendix.  Retroperitoneum: No mass or adenopathy.  Peritoneum: No ascites or pneumoperitoneum.  Vascular: No acute abnormality.  OSSEOUS: No acute abnormalities.  Review of the MIP images confirms the above findings.  IMPRESSION: 1. No acute intra-abdominal findings. 2. Negative for pulmonary embolism to the segmental level. 3. Bronchitis. 4. Chronic findings are stable from prior noted above.   Electronically Signed   By: Jorje Guild M.D.   On: 02/02/2014 01:03     Assessment & Plan by Problem: 53 year old man with history of HIV, prostate cancer s/p robot assisted laparoscopic radical prostatectomy, HTN, asthma presenting with headache and shortness of breath x 5 days  Shortness of breath: productive cough, shortness of breath and exacerbation of asthma likely 2/2 bronchitis, possibly COPD exacerbation seen  on CT. Negative for PE and PNA -Levaquin given in ED, will continue  -guaifenesin 600mg  BID -Robitussin DM 33mL Q4hr prn cough  Asthma: acute exacerbation of chronic asthma. Audible expiratory wheezes -albuterol 26mL neb 4 times daily prn wheezing -continue home fluticasone 1 puff Q4hr prn wheezing/shortness of breath -ipratropium 0.5mg  Q4hr prn wheezing/shortness of breath -continue home loratidine 10mg  QHS -continue home Qnasl 2 puffs each nostril BID  Headache: Acute on chronic post concussive. CT head with no acute intracranial abnormality. Improved in ED. -continue to monitor -home Zanaflex held  Lower back pain: Acute on chronic. X-ray lumbar spine with evidence of mild to moderate disc degeneration L3-4 as noted on the MRI and no acute abnormality -continue to monitor -home Voltaren gel held -home gabapentin 300mg  QID prn held  Dyspepsia: nausea and abdominal pain likely 2/2 dyspepsia. CT abdomen pelvis without evidence of acute intra-abdominal findings. -Zofran 4mg  Q6hr prn nausea -Compazine 10mg  Q6hr prn nausea -home PPI continued  HIV: CD4 430, viral load undetectable 10/13/2013 -continue home Prezista 800mg  QHS, Truvada 200-300mg  QHS, Isentress 800mg  QHS, Norvir 100mg  QHS  Chronic Anemia: Hgb 8.4, baseline 9.6. Receives vitamin B12 injections. -continue to monitor  HTN: not on home medication. Normotensive today. -continue ASA 81mg  MWF -EKG pending  Prostate cancer: s/p robot assisted laparoscopic radical prostatectomy 08/18/2013  FEN: heart healthy diet  DVT/PE ppx: Lovenox 40mg  daily  Dispo: Disposition is deferred at this time, awaiting improvement of current medical problems. Anticipated discharge in approximately 1 day(s).   The patient does have a current PCP (Albin Felling, MD) and does need an [facility] hospital follow-up appointment after discharge.  The patient does not have transportation limitations that hinder transportation to clinic  appointments.  Signed: Jacques Earthly, MD 02/02/2014, 2:16 AM

## 2014-02-02 NOTE — Discharge Summary (Signed)
Name: Casey Brennan MRN: [id number] DOB: 1960/08/04 53 y.o. PCP: Albin Felling, MD  Date of Admission: 02/01/2014  4:10 PM Date of Discharge: 02/04/2014 Attending Physician: No att. providers found  Discharge Diagnosis: Principal Problem:   Bronchitis, acute Active Problems:   HIV INFECTION   H/O prostate cancer   Intractable headache  Discharge Medications:   Medication List    STOP taking these medications       fluticasone 220 MCG/ACT inhaler  Commonly known as:  FLOVENT HFA      TAKE these medications       ALEVE 220 MG tablet  Generic drug:  naproxen sodium  Take 600 mg by mouth daily as needed (pain).     aspirin EC 81 MG tablet  Take 81 mg by mouth 3 (three) times a week. Monday, Tuesday and Wednesday     beclomethasone 80 MCG/ACT inhaler  Commonly known as:  QVAR  Inhale 1 puff into the lungs 2 (two) times daily as needed (shortness of breath/wheezing).     diclofenac sodium 1 % Gel  Commonly known as:  VOLTAREN  Apply 1 application topically 2 (two) times daily as needed (back and shoulder pain).     doxycycline 100 MG tablet  Commonly known as:  VIBRA-TABS  Take 1 tablet (100 mg total) by mouth every 12 (twelve) hours.     emtricitabine-tenofovir 200-300 MG per tablet  Commonly known as:  TRUVADA  Take 1 tablet by mouth at bedtime.     gabapentin 300 MG capsule  Commonly known as:  NEURONTIN  Take 300 mg by mouth 4 (four) times daily as needed (nerve pain).     ipratropium 0.02 % nebulizer solution  Commonly known as:  ATROVENT  Take 0.5 mg by nebulization 3 (three) times daily as needed for wheezing or shortness of breath. Mix with xopenex     levalbuterol 0.63 MG/3ML nebulizer solution  Commonly known as:  XOPENEX  Take 0.63 mg by nebulization 3 (three) times daily as needed for wheezing or shortness of breath. Mix with ipratropium     levalbuterol 45 MCG/ACT inhaler  Commonly known as:  XOPENEX HFA  Inhale 4 puffs into the lungs 4 (four)  times daily as needed for wheezing.     loratadine 10 MG tablet  Commonly known as:  CLARITIN  Take 10 mg by mouth at bedtime.     multivitamin with minerals Tabs tablet  Take 1 tablet by mouth at bedtime.     mupirocin ointment 2 %  Commonly known as:  BACTROBAN  Place 1 application into the nose 2 (two) times daily as needed (wound care (toes)).     omeprazole 40 MG capsule  Commonly known as:  PRILOSEC  Take 40 mg by mouth at bedtime.     ondansetron 4 MG tablet  Commonly known as:  ZOFRAN  Take 4 mg by mouth every 6 (six) hours as needed for nausea or vomiting.     PREZISTA 800 MG tablet  Generic drug:  Darunavir Ethanolate  Take 800 mg by mouth at bedtime.     raltegravir 400 MG tablet  Commonly known as:  ISENTRESS  Take 800 mg by mouth at bedtime.     REFRESH OP  Place 1 drop into both eyes at bedtime.     ritonavir 100 MG Tabs tablet  Commonly known as:  NORVIR  Take 100 mg by mouth at bedtime.     tizanidine 6 MG capsule  Commonly  known as:  ZANAFLEX  Take 6 mg by mouth 3 (three) times daily as needed for muscle spasms.     traZODone 50 MG tablet  Commonly known as:  DESYREL  Take 50 mg by mouth at bedtime as needed for sleep.        Disposition and follow-up:   Casey Brennan was discharged from [facility] in Stable condition.  At the hospital follow up visit please address:  1.  Please order PFT for the patient. May also consider repeating ECHO, last ECHO was on 2010.   2.  Labs / imaging needed at time of follow-up:   3.  Pending labs/ test needing follow-up:   Follow-up Appointments: Follow-up Information   Follow up with Christinia Gully, MD On 02/09/2014. (we have setup your appointment with Dr. Melvyn Novas on 02/09/2014 at 11 AM.)    Specialty:  Pulmonary Disease   Contact information:   [address] [address] [phone]       Schedule an appointment as soon as possible for a visit with Albin Felling, MD. (As  needed)    Specialty:  Internal Medicine   Contact information:   [place] [id number] [phone]       Discharge Instructions:  Follow up with your PCP. Go to pulmonology appointment on august 10th at 11 am with Dr. Melvyn Novas. Use your nebulizers as needed. Take zenaflex scheduled for 5 days. Also take gabapentin on a regular basis (not PRN) if that helps with back pain.   Consultations:    Procedures Performed:  Dg Chest 2 View  02/01/2014   CLINICAL DATA:  Short of breath and headache  EXAM: CHEST  2 VIEW  COMPARISON:  05/31/2013  FINDINGS: The heart size and mediastinal contours are within normal limits. Both lungs are clear. The visualized skeletal structures are unremarkable.  IMPRESSION: No active cardiopulmonary disease.   Electronically Signed   By: Franchot Gallo M.D.   On: 02/01/2014 19:07   Dg Lumbar Spine 2-3 Views  02/01/2014   CLINICAL DATA:  Low back pain  EXAM: LUMBAR SPINE - 2-3 VIEW  COMPARISON:  Lumbar MRI 06/02/2013  FINDINGS: Normal lumbar alignment. Negative for fracture or pars defect. Mild to moderate disc degeneration L3-4 as noted on the MRI.  IMPRESSION: No acute abnormality.   Electronically Signed   By: Franchot Gallo M.D.   On: 02/01/2014 19:08   Ct Head Wo Contrast  02/01/2014   CLINICAL DATA:  Headaches  EXAM: CT HEAD WITHOUT CONTRAST  TECHNIQUE: Contiguous axial images were obtained from the base of the skull through the vertex without intravenous contrast.  COMPARISON:  10/31/2012  FINDINGS: The bony calvarium is intact. The ventricles are of normal size and configuration. No findings to suggest acute hemorrhage, acute infarction or space-occupying mass lesion are noted.  IMPRESSION: No acute intracranial abnormality is noted.   Electronically Signed   By: Inez Catalina M.D.   On: 02/01/2014 19:30   Ct Angio Chest Pe W/cm &/or Wo Cm  02/02/2014   CLINICAL DATA:  Concern for pulmonary embolism.  Hypoxic episode.  EXAM: CT ANGIOGRAPHY CHEST  CT  ABDOMEN AND PELVIS WITH CONTRAST  TECHNIQUE: Multidetector CT imaging of the chest was performed using the standard protocol during bolus administration of intravenous contrast. Multiplanar CT image reconstructions and MIPs were obtained to evaluate the vascular anatomy. Multidetector CT imaging of the abdomen and pelvis was performed using the standard protocol during bolus  administration of intravenous contrast.  CONTRAST:  166mL OMNIPAQUE IOHEXOL 350 MG/ML SOLN  COMPARISON:  09/09/2013 abdominal CT.  FINDINGS: CTA CHEST FINDINGS  THORACIC INLET/BODY WALL:  No acute abnormality. No change in a prominent left axillary lymph node compared to PET-CT 03/31/2013.  MEDIASTINUM:  Normal heart size. No pericardial effusion. No acute vascular abnormality, including pulmonary embolism to the level of the segmental pulmonary arteries. No adenopathy.  LUNG WINDOWS:  Diffuse bronchial wall thickening with intermittent mucoid impaction. Mild ground-glass density in the upper lungs which is nonspecific and likely stable from 2014. No consolidation. No effusion. No suspicious pulmonary nodule. Calcified pulmonary nodule in the right upper lobe.  OSSEOUS:  Small medullary sclerotic foci in the left sixth and seventh ribs are nonspecific but stable from 2014.  CT ABDOMEN and PELVIS FINDINGS  BODY WALL: Unremarkable.  ABDOMEN/PELVIS:  Liver: No focal abnormality.  Biliary: Cholelithiasis.  No evidence of acute cholecystitis.  Pancreas: Unremarkable.  Spleen: Unremarkable.  Adrenals: 16 mm nodule in the right adrenal gland was low-density on preceding nonenhanced CT, consistent with adenoma.  Kidneys and ureters: Bilateral renal cysts. No evidence of solid mass. No hydronephrosis.  Bladder: Chronic circumferential wall thickening.  Reproductive: Prostatectomy. No concerning nodular tissue in the resection bed. No pelvic lymphadenopathy.  Bowel: No obstruction. Negative appendix.  Retroperitoneum: No mass or adenopathy.  Peritoneum:  No ascites or pneumoperitoneum.  Vascular: No acute abnormality.  OSSEOUS: No acute abnormalities.  Review of the MIP images confirms the above findings.  IMPRESSION: 1. No acute intra-abdominal findings. 2. Negative for pulmonary embolism to the segmental level. 3. Bronchitis. 4. Chronic findings are stable from prior noted above.   Electronically Signed   By: Jorje Guild M.D.   On: 02/02/2014 01:03   Ct Abdomen Pelvis W Contrast  02/02/2014   CLINICAL DATA:  Concern for pulmonary embolism.  Hypoxic episode.  EXAM: CT ANGIOGRAPHY CHEST  CT ABDOMEN AND PELVIS WITH CONTRAST  TECHNIQUE: Multidetector CT imaging of the chest was performed using the standard protocol during bolus administration of intravenous contrast. Multiplanar CT image reconstructions and MIPs were obtained to evaluate the vascular anatomy. Multidetector CT imaging of the abdomen and pelvis was performed using the standard protocol during bolus administration of intravenous contrast.  CONTRAST:  171mL OMNIPAQUE IOHEXOL 350 MG/ML SOLN  COMPARISON:  09/09/2013 abdominal CT.  FINDINGS: CTA CHEST FINDINGS  THORACIC INLET/BODY WALL:  No acute abnormality. No change in a prominent left axillary lymph node compared to PET-CT 03/31/2013.  MEDIASTINUM:  Normal heart size. No pericardial effusion. No acute vascular abnormality, including pulmonary embolism to the level of the segmental pulmonary arteries. No adenopathy.  LUNG WINDOWS:  Diffuse bronchial wall thickening with intermittent mucoid impaction. Mild ground-glass density in the upper lungs which is nonspecific and likely stable from 2014. No consolidation. No effusion. No suspicious pulmonary nodule. Calcified pulmonary nodule in the right upper lobe.  OSSEOUS:  Small medullary sclerotic foci in the left sixth and seventh ribs are nonspecific but stable from 2014.  CT ABDOMEN and PELVIS FINDINGS  BODY WALL: Unremarkable.  ABDOMEN/PELVIS:  Liver: No focal abnormality.  Biliary: Cholelithiasis.   No evidence of acute cholecystitis.  Pancreas: Unremarkable.  Spleen: Unremarkable.  Adrenals: 16 mm nodule in the right adrenal gland was low-density on preceding nonenhanced CT, consistent with adenoma.  Kidneys and ureters: Bilateral renal cysts. No evidence of solid mass. No hydronephrosis.  Bladder: Chronic circumferential wall thickening.  Reproductive: Prostatectomy. No concerning nodular tissue in the resection bed.  No pelvic lymphadenopathy.  Bowel: No obstruction. Negative appendix.  Retroperitoneum: No mass or adenopathy.  Peritoneum: No ascites or pneumoperitoneum.  Vascular: No acute abnormality.  OSSEOUS: No acute abnormalities.  Review of the MIP images confirms the above findings.  IMPRESSION: 1. No acute intra-abdominal findings. 2. Negative for pulmonary embolism to the segmental level. 3. Bronchitis. 4. Chronic findings are stable from prior noted above.   Electronically Signed   By: Jorje Guild M.D.   On: 02/02/2014 01:03    Admission HPI:   Casey Brennan is a 53 year old man with history of well controlled HIV, prostate cancer s/p robot assisted laparoscopic radical prostatectomy 08/18/2013, HTN, asthma presenting with headache and shortness of breath x 5 days. The headache was located in the parietal area radiating to the frontal region. He denies vision changes. His shortness of breath started shortly after. He took his home QVar, levalbuterol/ipratropium, and Flovent without improvement. He had a temperature of 101.1 yesterday morning, malaise, chest discomfort associated with cough, cough productive of brown mucous, wheezing, nausea, reflux, RUQ abdominal pain, diarrhea on Wednesday, and cold sores. Denies chills, anorexia, orthopnea, emesis, constipation, dysuria, rash.  In the ED, he received benadryl 25mg  IV, dilaudid 1mg  IV x 2, Atrovent/Xopenex 0.5/1.25 mg neb, zofran 4mg  IV, 1L NS bolus. He was found to have bronchitis on CT and given Levaquin 750mg  IV and prednisone  60mg .   Hospital Course by problem list: Principal Problem:   Bronchitis, acute Active Problems:   HIV INFECTION   H/O prostate cancer   Intractable headache   Casey Brennan is a 53 y.o. male with a history of well-controlled HIV (Last CD4 54), asthma, prostate cancer s/p prostatectomy (08/2013), post-concussion syndrome, and severe frontal headaches who presents with 5 days of productive cough and subjective fever concerning for pneumonia but found to have bronchitis with asthma exacerbation.   Acute Bronchitis Casey Brennan presented with new shortness of breath with increased sputum and fever for 5 days after cutting grass. These symptoms were likely due to acute bronchitis. The patient has a history of asthma. In the ED, he received Atrovent/Xopenex 0.5/1.25 mg neb, zofran 4mg  IV, and 1L NS bolus.  CXR on admission was clear, and CT angiogram of the chest showed diffuse bronchial wall thickening with mucoid impaction supporting this diagnosis. He also received CT of the abdomen and pelvis which was negative for acute disease. He was then given Levaquin 750mg  IV and prednisone 60mg . Levaquin was changed to a short course of doxycycline BID to be completed at home. His Flovent was discontinued because he was not taking the medication, he was already prescribed Qvar, and there is a risk of adrenal insufficiency with flovent. We continued Casey Brennan respiratory treatments during his admission. He has a follow-up appointment scheduled with pulmonology on Feb 09, 2014, for PFTs. His previous PFT showed increased DLCO.    Intractable headache Patient presented with a 6/10 headache with gradual onset and photophobia over several days. In the ED he received benadryl 25mg  IV and dilaudid 1mg  IV x 2. There were no neurological symptoms associated with his symptoms. The patient has a history of post-concussion syndrome associated chronic headaches. The headache was located in the posterior/occipital  region, and improved with zanaflex, ketorolac, and diphenhydramine treatment. A CT head was done for concern of stroke which came back negative.   Back pain/History of prostate cancer  Casey Brennan presented with complaints of lower back pain and some tenderness to palpation on paraspinal muscles. The  pain was not radiating and there were no associated neurological symptoms. He received a lumbar X-ray because there was concern over metastatic prostate cancer to the spine, however the test came back negative. The patient's back pain is likely musculoskeletal. During hospitalization we continued his home pain regimen of zanaflex 6 mg which resolved his symptoms.   Chest pain  The ED admission note stated the patient had chest pain, so an EKG was ordered which showed ST elevations in II, V3, and V5. Troponins were cycled and negative x3. Repeat EKG showed improved V3/V5 abnormalities, but consistent II elevations. Patient   HIV  Casey Brennan HIV is well controlled with CD4 430. His home antiretroviral regimen was continued during hospitalization.  Diet:  Casey Brennan was placed on the "Heart healthy" diet during admission.  DVT Prophylaxis:  Casey Brennan received Lovenox 40mg  daily for prophylaxis of DVTs during admission.  Discharge Vitals:   BP 125/77  Pulse 88  Temp(Src) 98.2 F (36.8 C) (Oral)  Resp 14  Ht 5\' 9"  (1.753 m)  Wt 213 lb 8 oz (96.843 kg)  BMI 31.51 kg/m2  SpO2 93%  Discharge Labs:  Results for orders placed during the hospital encounter of 02/01/14 (from the past 24 hour(s))  BASIC METABOLIC PANEL     Status: Abnormal   Collection Time    02/03/14 10:54 AM      Result Value Ref Range   Sodium 141  137 - 147 mEq/L   Potassium 3.7  3.7 - 5.3 mEq/L   Chloride 102  96 - 112 mEq/L   CO2 27  19 - 32 mEq/L   Glucose, Bld 116 (*) 70 - 99 mg/dL   BUN 13  6 - 23 mg/dL   Creatinine, Ser 1.44 (*) 0.50 - 1.35 mg/dL   Calcium 8.5  8.4 - 10.5 mg/dL   GFR calc non Af Amer 55 (*)  >90 mL/min   GFR calc Af Amer 64 (*) >90 mL/min   Anion gap 12  5 - 15    Signed: Dellia Nims, MD 02/04/2014, 8:34 AM

## 2014-02-02 NOTE — Progress Notes (Signed)
Pt complaining of SOB. MD paged. Orders received

## 2014-02-02 NOTE — ED Provider Notes (Signed)
53 y.o. Male with multiple medical problems including hiv presents with weakness and dyspnea.  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   I performed a history and physical examination of Casey Brennan and discussed his management with Dr. Estanislado Spire.  I agree with the history, physical, assessment, and plan of care, with the following exceptions: None  I was present for the following procedures: None Time Spent in Critical Care of the patient: None Time spent in discussions with the patient and family: 10  Maleke Feria Shelda Jakes, MD 02/02/14 1234

## 2014-02-02 NOTE — H&P (Signed)
  Date: 02/02/2014  Patient name: Casey Brennan  Medical record number: [id number]  Date of birth: 1960/11/24   I have seen and evaluated Casey Brennan. Mr. Laurent is a pleasant 53yo M with well controlled HIV, hx of recent prostatectomy in feb 2015, and asthma. He presents with 5 days of productive cough, fever concerning for pneumonia but found to have bronchititis with asthma/copd exacerbation. I have  discussed their care with the Residency Team.   Assessment and Plan: I have seen and evaluated the patient as outlined above. I agree with the formulated Assessment and Plan as detailed in the residents' admission note. One exception :  He does not carry diagnosis of hypertension. Will remove from med records. Have pcp follow as an outpatient    Carlyle Basques, MD 8/3/20151:02 PM

## 2014-02-02 NOTE — Progress Notes (Signed)
Subjective: Casey Brennan 53 yo male with hx of well controlled HIV, PCA s/p radical prostectomy 2/15, asthma presenting with HA and SOB x5 days and cough.   His SOB was started after he was mowing the lawn and also got a headache at that time. Was using PRN QVAR, flovent, and iptratropium and xopenex without improvement. Has some sinus tenderness and congestion. Denies any previous dx of COPD.  Currently feels better in terms of breathing. However, has headache which is chronic for him since his concussion. Located on the back of the head, 5/10 in severity. Usually helped by zanaflex and gabapentin. Also feels dilaudid helps as it helped in the ED.   Has lower back pain with radiation to legs. Denies weakness, numbness, tingling.    Objective: Vital signs in last 24 hours: Filed Vitals:   02/02/14 0140 02/02/14 0332 02/02/14 0730 02/02/14 1313  BP:  125/73 126/73 115/71  Pulse:  81 77 91  Temp:  98.4 F (36.9 C) 98.3 F (36.8 C) 98.5 F (36.9 C)  TempSrc:  Oral Oral Oral  Resp:  14 14 14   Height:  5\' 9"  (1.753 m)    Weight:  96.843 kg (213 lb 8 oz)    SpO2: 93% 91% 93% 89%   Weight change:   Intake/Output Summary (Last 24 hours) at 02/02/14 1457 Last data filed at 02/02/14 1300  Gross per 24 hour  Intake    600 ml  Output    250 ml  Net    350 ml   Vitals reviewed. General: resting in bed, NAD HEENT: PERRL, EOMI, no scleral icterus Cardiac: RRR, no rubs, murmurs or gallops Pulm: expiratory wheezes diffusely. Breathing unlabored. satting high 90's on room air.  Abd: soft, nontender, nondistended, BS present Back: TTP on the lower paraspinal muscles. No erythema. No tenderness on spinal processes.  Ext: warm and well perfused, no pedal edema. Sensation intact. Neuro: alert and oriented X3, cranial nerves II-XII grossly intact, strength and sensation to light touch equal in bilateral upper and lower extremities. Normal finger-to-nose exam, normal heel-to-shin, no  pronator drift, normal alternating movement.   Lab Results: Basic Metabolic Panel:  Recent Labs Lab 02/01/14 1813 02/02/14 0425  NA 140 138  K 4.0 3.9  CL 104 103  CO2 26 24  GLUCOSE 92 112*  BUN 12 10  CREATININE 1.28 1.27  CALCIUM 8.9 8.7   Liver Function Tests:  Recent Labs Lab 02/01/14 1813  AST 17  ALT 14  ALKPHOS 108  BILITOT 1.2  PROT 7.0  ALBUMIN 3.8   No results found for this basename: LIPASE, AMYLASE,  in the last 168 hours No results found for this basename: AMMONIA,  in the last 168 hours CBC:  Recent Labs Lab 02/01/14 1813 02/02/14 0425  WBC 6.7 5.2  NEUTROABS 3.1  --   HGB 8.4* 8.3*  HCT 28.0* 28.3*  MCV 75.7* 78.6  PLT 242 202   Cardiac Enzymes:  Recent Labs Lab 02/02/14 1215  TROPONINI <0.30   Recent Labs Lab 02/01/14 1822  COLORURINE YELLOW  LABSPEC 1.008  PHURINE 6.0  GLUCOSEU NEGATIVE  HGBUR NEGATIVE  BILIRUBINUR NEGATIVE  KETONESUR NEGATIVE  PROTEINUR NEGATIVE  UROBILINOGEN 1.0  NITRITE NEGATIVE  LEUKOCYTESUR NEGATIVE  Studies/Results: Dg Chest 2 View  02/01/2014   CLINICAL DATA:  Short of breath and headache  EXAM: CHEST  2 VIEW  COMPARISON:  05/31/2013  FINDINGS: The heart size and mediastinal contours are within normal limits. Both lungs  are clear. The visualized skeletal structures are unremarkable.  IMPRESSION: No active cardiopulmonary disease.   Electronically Signed   By: Franchot Gallo M.D.   On: 02/01/2014 19:07   Dg Lumbar Spine 2-3 Views  02/01/2014   CLINICAL DATA:  Low back pain  EXAM: LUMBAR SPINE - 2-3 VIEW  COMPARISON:  Lumbar MRI 06/02/2013  FINDINGS: Normal lumbar alignment. Negative for fracture or pars defect. Mild to moderate disc degeneration L3-4 as noted on the MRI.  IMPRESSION: No acute abnormality.   Electronically Signed   By: Franchot Gallo M.D.   On: 02/01/2014 19:08   Ct Head Wo Contrast  02/01/2014   CLINICAL DATA:  Headaches  EXAM: CT HEAD WITHOUT CONTRAST  TECHNIQUE: Contiguous axial images  were obtained from the base of the skull through the vertex without intravenous contrast.  COMPARISON:  10/31/2012  FINDINGS: The bony calvarium is intact. The ventricles are of normal size and configuration. No findings to suggest acute hemorrhage, acute infarction or space-occupying mass lesion are noted.  IMPRESSION: No acute intracranial abnormality is noted.   Electronically Signed   By: Inez Catalina M.D.   On: 02/01/2014 19:30   Ct Angio Chest Pe W/cm &/or Wo Cm  02/02/2014   CLINICAL DATA:  Concern for pulmonary embolism.  Hypoxic episode.  EXAM: CT ANGIOGRAPHY CHEST  CT ABDOMEN AND PELVIS WITH CONTRAST  TECHNIQUE: Multidetector CT imaging of the chest was performed using the standard protocol during bolus administration of intravenous contrast. Multiplanar CT image reconstructions and MIPs were obtained to evaluate the vascular anatomy. Multidetector CT imaging of the abdomen and pelvis was performed using the standard protocol during bolus administration of intravenous contrast.  CONTRAST:  170mL OMNIPAQUE IOHEXOL 350 MG/ML SOLN  COMPARISON:  09/09/2013 abdominal CT.  FINDINGS: CTA CHEST FINDINGS  THORACIC INLET/BODY WALL:  No acute abnormality. No change in a prominent left axillary lymph node compared to PET-CT 03/31/2013.  MEDIASTINUM:  Normal heart size. No pericardial effusion. No acute vascular abnormality, including pulmonary embolism to the level of the segmental pulmonary arteries. No adenopathy.  LUNG WINDOWS:  Diffuse bronchial wall thickening with intermittent mucoid impaction. Mild ground-glass density in the upper lungs which is nonspecific and likely stable from 2014. No consolidation. No effusion. No suspicious pulmonary nodule. Calcified pulmonary nodule in the right upper lobe.  OSSEOUS:  Small medullary sclerotic foci in the left sixth and seventh ribs are nonspecific but stable from 2014.  CT ABDOMEN and PELVIS FINDINGS  BODY WALL: Unremarkable.  ABDOMEN/PELVIS:  Liver: No focal  abnormality.  Biliary: Cholelithiasis.  No evidence of acute cholecystitis.  Pancreas: Unremarkable.  Spleen: Unremarkable.  Adrenals: 16 mm nodule in the right adrenal gland was low-density on preceding nonenhanced CT, consistent with adenoma.  Kidneys and ureters: Bilateral renal cysts. No evidence of solid mass. No hydronephrosis.  Bladder: Chronic circumferential wall thickening.  Reproductive: Prostatectomy. No concerning nodular tissue in the resection bed. No pelvic lymphadenopathy.  Bowel: No obstruction. Negative appendix.  Retroperitoneum: No mass or adenopathy.  Peritoneum: No ascites or pneumoperitoneum.  Vascular: No acute abnormality.  OSSEOUS: No acute abnormalities.  Review of the MIP images confirms the above findings.  IMPRESSION: 1. No acute intra-abdominal findings. 2. Negative for pulmonary embolism to the segmental level. 3. Bronchitis. 4. Chronic findings are stable from prior noted above.   Electronically Signed   By: Jorje Guild M.D.   On: 02/02/2014 01:03   Ct Abdomen Pelvis W Contrast  02/02/2014   CLINICAL DATA:  Concern for pulmonary embolism.  Hypoxic episode.  EXAM: CT ANGIOGRAPHY CHEST  CT ABDOMEN AND PELVIS WITH CONTRAST  TECHNIQUE: Multidetector CT imaging of the chest was performed using the standard protocol during bolus administration of intravenous contrast. Multiplanar CT image reconstructions and MIPs were obtained to evaluate the vascular anatomy. Multidetector CT imaging of the abdomen and pelvis was performed using the standard protocol during bolus administration of intravenous contrast.  CONTRAST:  161mL OMNIPAQUE IOHEXOL 350 MG/ML SOLN  COMPARISON:  09/09/2013 abdominal CT.  FINDINGS: CTA CHEST FINDINGS  THORACIC INLET/BODY WALL:  No acute abnormality. No change in a prominent left axillary lymph node compared to PET-CT 03/31/2013.  MEDIASTINUM:  Normal heart size. No pericardial effusion. No acute vascular abnormality, including pulmonary embolism to the level of  the segmental pulmonary arteries. No adenopathy.  LUNG WINDOWS:  Diffuse bronchial wall thickening with intermittent mucoid impaction. Mild ground-glass density in the upper lungs which is nonspecific and likely stable from 2014. No consolidation. No effusion. No suspicious pulmonary nodule. Calcified pulmonary nodule in the right upper lobe.  OSSEOUS:  Small medullary sclerotic foci in the left sixth and seventh ribs are nonspecific but stable from 2014.  CT ABDOMEN and PELVIS FINDINGS  BODY WALL: Unremarkable.  ABDOMEN/PELVIS:  Liver: No focal abnormality.  Biliary: Cholelithiasis.  No evidence of acute cholecystitis.  Pancreas: Unremarkable.  Spleen: Unremarkable.  Adrenals: 16 mm nodule in the right adrenal gland was low-density on preceding nonenhanced CT, consistent with adenoma.  Kidneys and ureters: Bilateral renal cysts. No evidence of solid mass. No hydronephrosis.  Bladder: Chronic circumferential wall thickening.  Reproductive: Prostatectomy. No concerning nodular tissue in the resection bed. No pelvic lymphadenopathy.  Bowel: No obstruction. Negative appendix.  Retroperitoneum: No mass or adenopathy.  Peritoneum: No ascites or pneumoperitoneum.  Vascular: No acute abnormality.  OSSEOUS: No acute abnormalities.  Review of the MIP images confirms the above findings.  IMPRESSION: 1. No acute intra-abdominal findings. 2. Negative for pulmonary embolism to the segmental level. 3. Bronchitis. 4. Chronic findings are stable from prior noted above.   Electronically Signed   By: Jorje Guild M.D.   On: 02/02/2014 01:03   Medications: I have reviewed the patient's current medications. Scheduled Meds: . aspirin EC  81 mg Oral Once per day on Mon Wed Fri  . Darunavir Ethanolate  800 mg Oral QHS  . doxycycline  100 mg Oral Q12H  . emtricitabine-tenofovir  1 tablet Oral QHS  . enoxaparin (LOVENOX) injection  40 mg Subcutaneous Q24H  . guaiFENesin  600 mg Oral BID  . loratadine  10 mg Oral QHS  .  pantoprazole  40 mg Oral Daily  . prochlorperazine  10 mg Intravenous Once  . raltegravir  800 mg Oral QHS  . ritonavir  100 mg Oral QHS  . sodium chloride  3 mL Intravenous Q12H   Continuous Infusions: . sodium chloride     PRN Meds:.acetaminophen, acetaminophen, albuterol, gabapentin, guaiFENesin-dextromethorphan, ipratropium, ondansetron (ZOFRAN) IV, ondansetron, tiZANidine Assessment/Plan: Principal Problem:   COPD exacerbation Active Problems:   HIV INFECTION   Bronchitis, acute   Prostate cancer  Plan:  #SOB -new with increased sputum and fever - likely bronchitis vs COPD exacerbation.  CT angio showed diffuse bronchial wall thickening with mucoid impaction likely from bronchitis.  - has hx of asthma - D/ced flovent b/c of risk of adrenal insufficiency since patient is on Qvar already.  - has appt with pulm on Feb 09, 2014. Will need outpatient PFT to evaluate asthma  vs COPD. - switched Levaquin to Doxy for 5-7 days 100mg  BID PO.  - cont prednisone. Cont albuterol, ipratropium  #Headache - without any neurological symptoms. - has hx of migraine. This is posterior/occipital headache, gets better with muscle relaxants. Might be tension type headache. - CT head was done for HA which was negative. - has PRN tylenol ordrered but doesn't want to take it. - started home regimen zanaflex + gabapentin which usually helps with the pain.  #Back pain - could be MSK with acute onset after cutting grass, has some tenderness to palpation on paraspinal muscles - lumbar xray negative. - continue pain regimen zanaflex + gabapentin and monitor for improvement. Can try NSAIDS.  #Chest pain - resolved. However, EKG today showed ST elev on V3, V5, II. Trend trop x3. Repeat EKG tomorrow.  #HIV - well controlled with CD4 430 - continue antivirals ritonavir, raltegravir, and emtricitabine-tenofovir.   #Diet: Heart healthy diet #DVT PPX: Lovenox 40mg  daily  Dispo: Disposition is deferred  at this time, awaiting improvement of current medical problems.  Anticipated discharge in approximately 2-3 day(s).   The patient does have a current PCP (Albin Felling, MD) and does need an [facility] hospital follow-up appointment after discharge.  The patient does not have transportation limitations that hinder transportation to clinic appointments.  .Services Needed at time of discharge: Y = Yes, Blank = No PT:   OT:   RN:   Equipment:   Other:     LOS: 1 day   Dellia Nims, MD 02/02/2014, 2:57 PM

## 2014-02-03 DIAGNOSIS — R51 Headache: Secondary | ICD-10-CM

## 2014-02-03 DIAGNOSIS — J209 Acute bronchitis, unspecified: Principal | ICD-10-CM

## 2014-02-03 DIAGNOSIS — R519 Headache, unspecified: Secondary | ICD-10-CM | POA: Diagnosis present

## 2014-02-03 DIAGNOSIS — R079 Chest pain, unspecified: Secondary | ICD-10-CM

## 2014-02-03 DIAGNOSIS — M549 Dorsalgia, unspecified: Secondary | ICD-10-CM

## 2014-02-03 DIAGNOSIS — Z8546 Personal history of malignant neoplasm of prostate: Secondary | ICD-10-CM

## 2014-02-03 LAB — URINE CULTURE

## 2014-02-03 LAB — BASIC METABOLIC PANEL
Anion gap: 12 (ref 5–15)
BUN: 13 mg/dL (ref 6–23)
CO2: 27 mEq/L (ref 19–32)
Calcium: 8.5 mg/dL (ref 8.4–10.5)
Chloride: 102 mEq/L (ref 96–112)
Creatinine, Ser: 1.44 mg/dL — ABNORMAL HIGH (ref 0.50–1.35)
GFR calc Af Amer: 64 mL/min — ABNORMAL LOW (ref >90)
GFR calc non Af Amer: 55 mL/min — ABNORMAL LOW (ref >90)
Glucose, Bld: 116 mg/dL — ABNORMAL HIGH (ref 70–99)
Potassium: 3.7 mEq/L (ref 3.7–5.3)
Sodium: 141 mEq/L (ref 137–147)

## 2014-02-03 LAB — TROPONIN I: Troponin I: <0.3 ng/mL (ref <0.30)

## 2014-02-03 MED ORDER — DOXYCYCLINE HYCLATE 100 MG PO TABS: 100.0000 mg | ORAL_TABLET | Freq: Two times a day (BID) | ORAL | Status: DC

## 2014-02-03 MED ORDER — RITONAVIR 100 MG PO TABS
100.0000 mg | ORAL_TABLET | Freq: Every day | ORAL | Status: DC
  Filled 2014-02-03: qty 1

## 2014-02-03 MED ORDER — DARUNAVIR ETHANOLATE 800 MG PO TABS
800.0000 mg | ORAL_TABLET | Freq: Every day | ORAL | Status: DC
  Filled 2014-02-03: qty 1

## 2014-02-03 NOTE — Progress Notes (Signed)
NURSING PROGRESS NOTE  Casey Brennan [id number] Discharge Data: 02/03/2014 2:10 PM Attending Provider: Carlyle Basques, MD [id number], Bethann Berkshire, MD     [name] to be D/C'd Home per MD order.  Discussed with the patient and patient's sister the After Visit Summary and all questions fully answered. All IV's discontinued with no bleeding noted. All belongings returned to patient for patient to take home.   Last Vital Signs:  Blood pressure 125/77, pulse 88, temperature 98.2 F (36.8 C), temperature source Oral, resp. rate 14, height 5\' 9"  (1.753 m), weight 96.843 kg (213 lb 8 oz), SpO2 93.00%.  Discharge Medication List   Medication List    STOP taking these medications       fluticasone 220 MCG/ACT inhaler  Commonly known as:  FLOVENT HFA      TAKE these medications       ALEVE 220 MG tablet  Generic drug:  naproxen sodium  Take 600 mg by mouth daily as needed (pain).     aspirin EC 81 MG tablet  Take 81 mg by mouth 3 (three) times a week. Monday, Tuesday and Wednesday     beclomethasone 80 MCG/ACT inhaler  Commonly known as:  QVAR  Inhale 1 puff into the lungs 2 (two) times daily as needed (shortness of breath/wheezing).     diclofenac sodium 1 % Gel  Commonly known as:  VOLTAREN  Apply 1 application topically 2 (two) times daily as needed (back and shoulder pain).     doxycycline 100 MG tablet  Commonly known as:  VIBRA-TABS  Take 1 tablet (100 mg total) by mouth every 12 (twelve) hours.     emtricitabine-tenofovir 200-300 MG per tablet  Commonly known as:  TRUVADA  Take 1 tablet by mouth at bedtime.     gabapentin 300 MG capsule  Commonly known as:  NEURONTIN  Take 300 mg by mouth 4 (four) times daily as needed (nerve pain).     ipratropium 0.02 % nebulizer solution  Commonly known as:  ATROVENT  Take 0.5 mg by nebulization 3 (three) times daily as needed for wheezing or shortness of breath. Mix with xopenex     levalbuterol 0.63 MG/3ML nebulizer solution   Commonly known as:  XOPENEX  Take 0.63 mg by nebulization 3 (three) times daily as needed for wheezing or shortness of breath. Mix with ipratropium     levalbuterol 45 MCG/ACT inhaler  Commonly known as:  XOPENEX HFA  Inhale 4 puffs into the lungs 4 (four) times daily as needed for wheezing.     loratadine 10 MG tablet  Commonly known as:  CLARITIN  Take 10 mg by mouth at bedtime.     multivitamin with minerals Tabs tablet  Take 1 tablet by mouth at bedtime.     mupirocin ointment 2 %  Commonly known as:  BACTROBAN  Place 1 application into the nose 2 (two) times daily as needed (wound care (toes)).     omeprazole 40 MG capsule  Commonly known as:  PRILOSEC  Take 40 mg by mouth at bedtime.     ondansetron 4 MG tablet  Commonly known as:  ZOFRAN  Take 4 mg by mouth every 6 (six) hours as needed for nausea or vomiting.     PREZISTA 800 MG tablet  Generic drug:  Darunavir Ethanolate  Take 800 mg by mouth at bedtime.     raltegravir 400 MG tablet  Commonly known as:  ISENTRESS  Take 800 mg by mouth at  bedtime.     REFRESH OP  Place 1 drop into both eyes at bedtime.     ritonavir 100 MG Tabs tablet  Commonly known as:  NORVIR  Take 100 mg by mouth at bedtime.     tizanidine 6 MG capsule  Commonly known as:  ZANAFLEX  Take 6 mg by mouth 3 (three) times daily as needed for muscle spasms.     traZODone 50 MG tablet  Commonly known as:  DESYREL  Take 50 mg by mouth at bedtime as needed for sleep.         Wallie Renshaw, RN

## 2014-02-03 NOTE — Progress Notes (Signed)
  Date: 02/03/2014  Patient name: Casey Brennan  Medical record number: [id number]  Date of birth: Sep 04, 1960   This patient has been seen and the plan of care was discussed with the house staff. Please see their note for complete details. I concur with their findings with the following additions/corrections:  Patient having much improvement with returning back to home regimen of muscle relaxant and gabapentin for headache. Had episode of shortness of breath yesterday relieved with albuterol nebulizers. We will arrange to have medications at home. To finish out doxycycline for 7 day course of bronchitis. He had repeat ekg today to see if still having sustained j point elevation in 2 leads vs. Erroneous finding. Nonetheless patient remains asymptomatic, free of chest pain. Will arrange follow up with pulmonary to determine best management of asthma. Also arrange to see pcp to follow up on his symptoms to see if warrants further work-up in ambulatory setting.  Carlyle Basques, MD 02/03/2014, 1:13 PM

## 2014-02-03 NOTE — Progress Notes (Addendum)
Subjective: Casey Brennan 53 yo male with hx of well controlled HIV, hx of PCA s/p radical prostectomy 2/15, asthma presenting with intractable HA and SOB x5 days and cough.   Currently feels better in terms of breathing. Had SOB last night, improved with nebs and oxygen. HA improved with schedule gapanetin and zanaflex. Received toradol 15mg  and benadryl last night but now feeling lot better. CXR was done for SOB last night which was normal.  Back pain is better today also. Ready to go home. No complaints currently.   Objective: Vital signs in last 24 hours: Filed Vitals:   02/03/14 0108 02/03/14 0447 02/03/14 0510 02/03/14 0842  BP:   125/77   Pulse: 91  88   Temp:   98.2 F (36.8 C)   TempSrc:   Oral   Resp: 16  14   Height:      Weight:      SpO2: 93% 94% 93% 93%   Weight change:   Intake/Output Summary (Last 24 hours) at 02/03/14 0959 Last data filed at 02/02/14 1330  Gross per 24 hour  Intake    702 ml  Output      0 ml  Net    702 ml   Vitals reviewed. General: resting in bed, NAD HEENT: PERRL, EOMI, no scleral icterus Cardiac: RRR, no rubs, murmurs or gallops Pulm: expiratory wheezes diffusely. Breathing unlabored. satting high 90's on room air.  Abd: soft, nontender, nondistended, BS present Back: TTP on the lower paraspinal muscles. No erythema. No tenderness on spinal processes.  Ext: warm and well perfused, no pedal edema. Sensation intact. Neuro: alert and oriented X3, cranial nerves II-XII grossly intact, strength and sensation to light touch equal in bilateral upper and lower extremities. Normal finger-to-nose exam, normal heel-to-shin, no pronator drift, normal alternating movement.   Lab Results: Basic Metabolic Panel:  Recent Labs Lab 02/01/14 1813 02/02/14 0425  NA 140 138  K 4.0 3.9  CL 104 103  CO2 26 24  GLUCOSE 92 112*  BUN 12 10  CREATININE 1.28 1.27  CALCIUM 8.9 8.7   Liver Function Tests:  Recent Labs Lab 02/01/14 1813    AST 17  ALT 14  ALKPHOS 108  BILITOT 1.2  PROT 7.0  ALBUMIN 3.8   No results found for this basename: LIPASE, AMYLASE,  in the last 168 hours No results found for this basename: AMMONIA,  in the last 168 hours CBC:  Recent Labs Lab 02/01/14 1813 02/02/14 0425  WBC 6.7 5.2  NEUTROABS 3.1  --   HGB 8.4* 8.3*  HCT 28.0* 28.3*  MCV 75.7* 78.6  PLT 242 202   Cardiac Enzymes:  Recent Labs Lab 02/02/14 1215 02/02/14 1615 02/02/14 2300  TROPONINI <0.30 <0.30 <0.30    Recent Labs Lab 02/01/14 1822  COLORURINE YELLOW  LABSPEC 1.008  PHURINE 6.0  GLUCOSEU NEGATIVE  HGBUR NEGATIVE  BILIRUBINUR NEGATIVE  KETONESUR NEGATIVE  PROTEINUR NEGATIVE  UROBILINOGEN 1.0  NITRITE NEGATIVE  LEUKOCYTESUR NEGATIVE  Studies/Results: Dg Chest 2 View  02/01/2014   CLINICAL DATA:  Short of breath and headache  EXAM: CHEST  2 VIEW  COMPARISON:  05/31/2013  FINDINGS: The heart size and mediastinal contours are within normal limits. Both lungs are clear. The visualized skeletal structures are unremarkable.  IMPRESSION: No active cardiopulmonary disease.   Electronically Signed   By: Franchot Gallo M.D.   On: 02/01/2014 19:07   Dg Lumbar Spine 2-3 Views  02/01/2014   CLINICAL DATA:  Low back pain  EXAM: LUMBAR SPINE - 2-3 VIEW  COMPARISON:  Lumbar MRI 06/02/2013  FINDINGS: Normal lumbar alignment. Negative for fracture or pars defect. Mild to moderate disc degeneration L3-4 as noted on the MRI.  IMPRESSION: No acute abnormality.   Electronically Signed   By: Franchot Gallo M.D.   On: 02/01/2014 19:08   Ct Head Wo Contrast  02/01/2014   CLINICAL DATA:  Headaches  EXAM: CT HEAD WITHOUT CONTRAST  TECHNIQUE: Contiguous axial images were obtained from the base of the skull through the vertex without intravenous contrast.  COMPARISON:  10/31/2012  FINDINGS: The bony calvarium is intact. The ventricles are of normal size and configuration. No findings to suggest acute hemorrhage, acute infarction or  space-occupying mass lesion are noted.  IMPRESSION: No acute intracranial abnormality is noted.   Electronically Signed   By: Inez Catalina M.D.   On: 02/01/2014 19:30   Ct Angio Chest Pe W/cm &/or Wo Cm  02/02/2014   CLINICAL DATA:  Concern for pulmonary embolism.  Hypoxic episode.  EXAM: CT ANGIOGRAPHY CHEST  CT ABDOMEN AND PELVIS WITH CONTRAST  TECHNIQUE: Multidetector CT imaging of the chest was performed using the standard protocol during bolus administration of intravenous contrast. Multiplanar CT image reconstructions and MIPs were obtained to evaluate the vascular anatomy. Multidetector CT imaging of the abdomen and pelvis was performed using the standard protocol during bolus administration of intravenous contrast.  CONTRAST:  117mL OMNIPAQUE IOHEXOL 350 MG/ML SOLN  COMPARISON:  09/09/2013 abdominal CT.  FINDINGS: CTA CHEST FINDINGS  THORACIC INLET/BODY WALL:  No acute abnormality. No change in a prominent left axillary lymph node compared to PET-CT 03/31/2013.  MEDIASTINUM:  Normal heart size. No pericardial effusion. No acute vascular abnormality, including pulmonary embolism to the level of the segmental pulmonary arteries. No adenopathy.  LUNG WINDOWS:  Diffuse bronchial wall thickening with intermittent mucoid impaction. Mild ground-glass density in the upper lungs which is nonspecific and likely stable from 2014. No consolidation. No effusion. No suspicious pulmonary nodule. Calcified pulmonary nodule in the right upper lobe.  OSSEOUS:  Small medullary sclerotic foci in the left sixth and seventh ribs are nonspecific but stable from 2014.  CT ABDOMEN and PELVIS FINDINGS  BODY WALL: Unremarkable.  ABDOMEN/PELVIS:  Liver: No focal abnormality.  Biliary: Cholelithiasis.  No evidence of acute cholecystitis.  Pancreas: Unremarkable.  Spleen: Unremarkable.  Adrenals: 16 mm nodule in the right adrenal gland was low-density on preceding nonenhanced CT, consistent with adenoma.  Kidneys and ureters: Bilateral  renal cysts. No evidence of solid mass. No hydronephrosis.  Bladder: Chronic circumferential wall thickening.  Reproductive: Prostatectomy. No concerning nodular tissue in the resection bed. No pelvic lymphadenopathy.  Bowel: No obstruction. Negative appendix.  Retroperitoneum: No mass or adenopathy.  Peritoneum: No ascites or pneumoperitoneum.  Vascular: No acute abnormality.  OSSEOUS: No acute abnormalities.  Review of the MIP images confirms the above findings.  IMPRESSION: 1. No acute intra-abdominal findings. 2. Negative for pulmonary embolism to the segmental level. 3. Bronchitis. 4. Chronic findings are stable from prior noted above.   Electronically Signed   By: Jorje Guild M.D.   On: 02/02/2014 01:03   Ct Abdomen Pelvis W Contrast  02/02/2014   CLINICAL DATA:  Concern for pulmonary embolism.  Hypoxic episode.  EXAM: CT ANGIOGRAPHY CHEST  CT ABDOMEN AND PELVIS WITH CONTRAST  TECHNIQUE: Multidetector CT imaging of the chest was performed using the standard protocol during bolus administration of intravenous contrast. Multiplanar CT image reconstructions and  MIPs were obtained to evaluate the vascular anatomy. Multidetector CT imaging of the abdomen and pelvis was performed using the standard protocol during bolus administration of intravenous contrast.  CONTRAST:  19mL OMNIPAQUE IOHEXOL 350 MG/ML SOLN  COMPARISON:  09/09/2013 abdominal CT.  FINDINGS: CTA CHEST FINDINGS  THORACIC INLET/BODY WALL:  No acute abnormality. No change in a prominent left axillary lymph node compared to PET-CT 03/31/2013.  MEDIASTINUM:  Normal heart size. No pericardial effusion. No acute vascular abnormality, including pulmonary embolism to the level of the segmental pulmonary arteries. No adenopathy.  LUNG WINDOWS:  Diffuse bronchial wall thickening with intermittent mucoid impaction. Mild ground-glass density in the upper lungs which is nonspecific and likely stable from 2014. No consolidation. No effusion. No suspicious  pulmonary nodule. Calcified pulmonary nodule in the right upper lobe.  OSSEOUS:  Small medullary sclerotic foci in the left sixth and seventh ribs are nonspecific but stable from 2014.  CT ABDOMEN and PELVIS FINDINGS  BODY WALL: Unremarkable.  ABDOMEN/PELVIS:  Liver: No focal abnormality.  Biliary: Cholelithiasis.  No evidence of acute cholecystitis.  Pancreas: Unremarkable.  Spleen: Unremarkable.  Adrenals: 16 mm nodule in the right adrenal gland was low-density on preceding nonenhanced CT, consistent with adenoma.  Kidneys and ureters: Bilateral renal cysts. No evidence of solid mass. No hydronephrosis.  Bladder: Chronic circumferential wall thickening.  Reproductive: Prostatectomy. No concerning nodular tissue in the resection bed. No pelvic lymphadenopathy.  Bowel: No obstruction. Negative appendix.  Retroperitoneum: No mass or adenopathy.  Peritoneum: No ascites or pneumoperitoneum.  Vascular: No acute abnormality.  OSSEOUS: No acute abnormalities.  Review of the MIP images confirms the above findings.  IMPRESSION: 1. No acute intra-abdominal findings. 2. Negative for pulmonary embolism to the segmental level. 3. Bronchitis. 4. Chronic findings are stable from prior noted above.   Electronically Signed   By: Jorje Guild M.D.   On: 02/02/2014 01:03   Dg Chest Port 1 View  02/03/2014   CLINICAL DATA:  Short of breath.  EXAM: PORTABLE CHEST - 1 VIEW  COMPARISON:  02/01/2014  FINDINGS: The heart size and mediastinal contours are within normal limits. Both lungs are clear. The visualized skeletal structures are unremarkable.  IMPRESSION: No active disease.   Electronically Signed   By: Franchot Gallo M.D.   On: 02/03/2014 02:47   Medications: I have reviewed the patient's current medications. Scheduled Meds: . aspirin EC  81 mg Oral Once per day on Mon Wed Fri  . Darunavir Ethanolate  800 mg Oral QHS  . doxycycline  100 mg Oral Q12H  . emtricitabine-tenofovir  1 tablet Oral QHS  . enoxaparin (LOVENOX)  injection  40 mg Subcutaneous Q24H  . guaiFENesin  600 mg Oral BID  . ipratropium-albuterol  3 mL Nebulization Q4H  . loratadine  10 mg Oral QHS  . pantoprazole  40 mg Oral Daily  . prochlorperazine  10 mg Intravenous Once  . raltegravir  800 mg Oral QHS  . ritonavir  100 mg Oral QHS  . sodium chloride  3 mL Intravenous Q12H   Continuous Infusions:   PRN Meds:.acetaminophen, acetaminophen, albuterol, gabapentin, guaiFENesin-dextromethorphan, ondansetron (ZOFRAN) IV, ondansetron, tiZANidine Assessment/Plan: Principal Problem:   Bronchitis, acute Active Problems:   HIV INFECTION   H/O prostate cancer   Intractable headache  #Dyspnea due to Bronchitis -new with increased sputum and fever - likely bronchitis vs asthma.  - CT angio showed diffuse bronchial wall thickening with mucoid impaction likely from bronchitis.  - He had PFT on 11/27/12  which did not show obstructive lung disease (FEV1 79%, FEV1/FVC ratio 86%, TLC 111%, DLco 120).  - D/ced flovent b/c of risk of adrenal insufficient. - cont prednisone. Cont PRN albuterol. Duoneb scheduled due to SOB last night.  - switched Levaquin to Doxy yesterday for 5-7 days 100mg  BID PO.  - cont prednisone. Scheduled duoneb q4hr. Wean down as his SOB is better.  - has appt with pulm on Feb 09, 2014. Will need outpatient PFT again. - PFT last year showed increased  DLCO, can be from asthma or CHF. Last echo was 2010 which showed TR, EF 50%. Recommend rechecking ECHO as outpatient to follow up changes.   #intractable Headache - without any neurological symptoms. - has hx of migraine. This is posterior/occipital headache, gets better with muscle relaxants. Might be tension type headache. - CT head was done for HA which was negative. - started home regimen zanaflex + gabapentin which usually helps with the pain. - will schedule zanaflex for home for 5 days to get rid of the HA cycle.  #Back pain - could be MSK with acute onset after cutting  grass, has some tenderness to palpation on paraspinal muscles - lumbar xray negative. - continue pain regimen zanaflex + gabapentin and monitor for improvement. Can try NSAIDS.  #HIV - well controlled with CD4 430 - continue antivirals ritonavir, raltegravir, and emtricitabine-tenofovir.   #Diet: Heart healthy diet #DVT PPX: Lovenox 40mg  daily  Dispo: Disposition is deferred at this time, awaiting improvement of current medical problems.  Anticipated discharge in approximately 2-3 day(s).   The patient does have a current PCP (Albin Felling, MD) and does need an [facility] hospital follow-up appointment after discharge.  The patient does not have transportation limitations that hinder transportation to clinic appointments.  .Services Needed at time of discharge: Y = Yes, Blank = No PT:   OT:   RN:   Equipment:   Other:     LOS: 2 days   Dellia Nims, MD 02/03/2014, 9:59 AM

## 2014-02-03 NOTE — Discharge Instructions (Signed)
It was a pleasure taking care of you. You were hospitalized for bronchitis and headache.  Please take zenaflex scheduled (not as needed) for 5 days to break your headache cycle. Also take gabapentin scheduled everyday if you feel it helps. It doesn't work well if you take it only occasionally.  Continue to use your nebulizers at home to treat your breathing difficulty.   Follow up with your primary doctor and pulmonologist. You will need to repeat your pulmonary function test outpatient.  Tension Headache A tension headache is a feeling of pain, pressure, or aching often felt over the front and sides of the head. The pain can be dull or can feel tight (constricting). It is the most common type of headache. Tension headaches are not normally associated with nausea or vomiting and do not get worse with physical activity. Tension headaches can last 30 minutes to several days.  CAUSES  The exact cause is not known, but it may be caused by chemicals and hormones in the brain that lead to pain. Tension headaches often begin after stress, anxiety, or depression. Other triggers may include:  Alcohol.  Caffeine (too much or withdrawal).  Respiratory infections (colds, flu, sinus infections).  Dental problems or teeth clenching.  Fatigue.  Holding your head and neck in one position too long while using a computer. SYMPTOMS   Pressure around the head.   Dull, aching head pain.   Pain felt over the front and sides of the head.   Tenderness in the muscles of the head, neck, and shoulders. DIAGNOSIS  A tension headache is often diagnosed based on:   Symptoms.   Physical examination.   A CT scan or MRI of your head. These tests may be ordered if symptoms are severe or unusual. TREATMENT  Medicines may be given to help relieve symptoms.  HOME CARE INSTRUCTIONS   Only take over-the-counter or prescription medicines for pain or discomfort as directed by your caregiver.   Lie down  in a dark, quiet room when you have a headache.   Keep a journal to find out what may be triggering your headaches. For example, write down:  What you eat and drink.  How much sleep you get.  Any change to your diet or medicines.  Try massage or other relaxation techniques.   Ice packs or heat applied to the head and neck can be used. Use these 3 to 4 times per day for 15 to 20 minutes each time, or as needed.   Limit stress.   Sit up straight, and do not tense your muscles.   Quit smoking if you smoke.  Limit alcohol use.  Decrease the amount of caffeine you drink, or stop drinking caffeine.  Eat and exercise regularly.  Get 7 to 9 hours of sleep, or as recommended by your caregiver.  Avoid excessive use of pain medicine as recurrent headaches can occur.  SEEK MEDICAL CARE IF:   You have problems with the medicines you were prescribed.  Your medicines do not work.  You have a change from the usual headache.  You have nausea or vomiting. SEEK IMMEDIATE MEDICAL CARE IF:   Your headache becomes severe.  You have a fever.  You have a stiff neck.  You have loss of vision.  You have muscular weakness or loss of muscle control.  You lose your balance or have trouble walking.  You feel faint or pass out.  You have severe symptoms that are different from your first symptoms.  MAKE SURE YOU:   Understand these instructions.  Will watch your condition.  Will get help right away if you are not doing well or get worse. Document Released: 06/19/2005 Document Revised: 09/11/2011 Document Reviewed: 06/09/2011 [facility] Patient Information 2015 [place], [place]. This information is not intended to replace advice given to you by your health care provider. Make sure you discuss any questions you have with your health care provider.  Acute Bronchitis Bronchitis is inflammation of the airways that extend from the windpipe into the lungs (bronchi). The inflammation  often causes mucus to develop. This leads to a cough, which is the most common symptom of bronchitis.  In acute bronchitis, the condition usually develops suddenly and goes away over time, usually in a couple weeks. Smoking, allergies, and asthma can make bronchitis worse. Repeated episodes of bronchitis may cause further lung problems.  CAUSES Acute bronchitis is most often caused by the same virus that causes a cold. The virus can spread from person to person (contagious) through coughing, sneezing, and touching contaminated objects. SIGNS AND SYMPTOMS   Cough.   Fever.   Coughing up mucus.   Body aches.   Chest congestion.   Chills.   Shortness of breath.   Sore throat.  DIAGNOSIS  Acute bronchitis is usually diagnosed through a physical exam. Your health care provider will also ask you questions about your medical history. Tests, such as chest X-rays, are sometimes done to rule out other conditions.  TREATMENT  Acute bronchitis usually goes away in a couple weeks. Oftentimes, no medical treatment is necessary. Medicines are sometimes given for relief of fever or cough. Antibiotic medicines are usually not needed but may be prescribed in certain situations. In some cases, an inhaler may be recommended to help reduce shortness of breath and control the cough. A cool mist vaporizer may also be used to help thin bronchial secretions and make it easier to clear the chest.  HOME CARE INSTRUCTIONS  Get plenty of rest.   Drink enough fluids to keep your urine clear or pale yellow (unless you have a medical condition that requires fluid restriction). Increasing fluids may help thin your respiratory secretions (sputum) and reduce chest congestion, and it will prevent dehydration.   Take medicines only as directed by your health care provider.  If you were prescribed an antibiotic medicine, finish it all even if you start to feel better.  Avoid smoking and secondhand smoke.  Exposure to cigarette smoke or irritating chemicals will make bronchitis worse. If you are a smoker, consider using nicotine gum or skin patches to help control withdrawal symptoms. Quitting smoking will help your lungs heal faster.   Reduce the chances of another bout of acute bronchitis by washing your hands frequently, avoiding people with cold symptoms, and trying not to touch your hands to your mouth, nose, or eyes.   Keep all follow-up visits as directed by your health care provider.  SEEK MEDICAL CARE IF: Your symptoms do not improve after 1 week of treatment.  SEEK IMMEDIATE MEDICAL CARE IF:  You develop an increased fever or chills.   You have chest pain.   You have severe shortness of breath.  You have bloody sputum.   You develop dehydration.  You faint or repeatedly feel like you are going to pass out.  You develop repeated vomiting.  You develop a severe headache. MAKE SURE YOU:   Understand these instructions.  Will watch your condition.  Will get help right away if you are  not doing well or get worse. Document Released: 07/27/2004 Document Revised: 11/03/2013 Document Reviewed: 12/10/2012 [facility] Patient Information 2015 [place], [place]. This information is not intended to replace advice given to you by your health care provider. Make sure you discuss any questions you have with your health care provider.

## 2014-02-03 NOTE — Progress Notes (Signed)
Subjective: Yesterday evening, patient received prochlorperazine 15 mg and diphenhydramine 25 mg twice and ketorolac 15 mg once for headache which did not resolve his symptoms. Patient was requesting dilaudid, but it was decided his home dose of tizanadine 6 mg and gabapentin 300 mg would be a better option due to less adverse side effects and known response to the medication. Troponins came back negative x3.   Overnight the patient had an episode of shortness of breath that led to him receiving a CXR which was clear of active disease.   Nursing reports the patient was requesting trazodone which is not in the patient's chart, and scheduled respiratory treatments which had been set as PRN. He also was requesting pain medication and so he received ketorolac 15 mg, tizanidine 6 mg, and diphenhydramine 12.5 mg.  This morning, the patient feels his breathing is improved, his headache is gone, and his back pain has been relieved. He denies CP, SOB, abdominal, fevers, and chills.  Objective: Vital signs in last 24 hours: Filed Vitals:   02/02/14 2214 02/03/14 0108 02/03/14 0447 02/03/14 0510  BP:    125/77  Pulse:  91  88  Temp:    98.2 F (36.8 C)  TempSrc:    Oral  Resp:  16  14  Height:      Weight:      SpO2: 91% 93% 94% 93%   Physical exam: General: Sleeping at a 20 degree angle, in NAD, sister in the room CV: RRR, nl S1/S2, no m/r/g Pulm: Normal WOB, restricted inspiration, improvement in crackles Abd: Soft, non-tender, non-distended, normoactive BS Ext: Warm, dry, well-perfused, no lower extremity edema  Lab Results: Basic Metabolic Panel:  Recent Labs  02/01/14 1813 02/02/14 0425  NA 140 138  K 4.0 3.9  CL 104 103  CO2 26 24  GLUCOSE 92 112*  BUN 12 10  CREATININE 1.28 1.27  CALCIUM 8.9 8.7   Liver Function Tests:  Recent Labs  02/01/14 1813  AST 17  ALT 14  ALKPHOS 108  BILITOT 1.2  PROT 7.0  ALBUMIN 3.8   CBC:  Recent Labs  02/01/14 1813  02/02/14 0425  WBC 6.7 5.2  NEUTROABS 3.1  --   HGB 8.4* 8.3*  HCT 28.0* 28.3*  MCV 75.7* 78.6  PLT 242 202   Cardiac Enzymes:  Recent Labs  02/02/14 1215 02/02/14 1615 02/02/14 2300  TROPONINI <0.30 <0.30 <0.30   Urinalysis:  Recent Labs  02/01/14 1822  COLORURINE YELLOW  LABSPEC 1.008  PHURINE 6.0  GLUCOSEU NEGATIVE  HGBUR NEGATIVE  BILIRUBINUR NEGATIVE  KETONESUR NEGATIVE  PROTEINUR NEGATIVE  UROBILINOGEN 1.0  NITRITE NEGATIVE  LEUKOCYTESUR NEGATIVE   Studies/Results: Dg Chest 1 View: 02/02/2014    FINDINGS: The heart size and mediastinal contours are within normal limits. Both lungs are clear. The visualized skeletal structures are unremarkable.  IMPRESSION: No active disease  Dg Chest 2 View: 02/01/2014    FINDINGS: The heart size and mediastinal contours are within normal limits. Both lungs are clear. The visualized skeletal structures are unremarkable.   IMPRESSION: No active cardiopulmonary disease.  Dg Lumbar Spine 2-3 Views: 02/01/2014    FINDINGS: Normal lumbar alignment. Negative for fracture or pars defect. Mild to moderate disc degeneration L3-4 as noted on the MRI.   IMPRESSION: No acute abnormality.  Ct Head Wo Contrast: 02/01/2014    FINDINGS: The bony calvarium is intact. The ventricles are of normal size and configuration. No findings to suggest acute hemorrhage, acute infarction or  space-occupying mass lesion are noted.   IMPRESSION: No acute intracranial abnormality is noted.  Ct Angio Chest Pe W/cm &/or Wo Cm: 02/02/2014    1. No acute intra-abdominal findings.  2. Negative for pulmonary embolism to the segmental level.  3. Bronchitis.  4. Chronic findings are stable from prior  Ct Abdomen Pelvis W Contrast: 02/02/2014    1. No acute intra-abdominal findings.  2. Negative for pulmonary embolism to the segmental level.  3. Bronchitis.  4. Chronic findings are stable from prior  Medications: I have reviewed the patient's current  medications. Scheduled Meds: . aspirin EC  81 mg Oral Once per day on Mon Wed Fri  . Darunavir Ethanolate  800 mg Oral QHS  . doxycycline  100 mg Oral Q12H  . emtricitabine-tenofovir  1 tablet Oral QHS  . enoxaparin (LOVENOX) injection  40 mg Subcutaneous Q24H  . guaiFENesin  600 mg Oral BID  . ipratropium-albuterol  3 mL Nebulization Q4H  . loratadine  10 mg Oral QHS  . pantoprazole  40 mg Oral Daily  . prochlorperazine  10 mg Intravenous Once  . raltegravir  800 mg Oral QHS  . ritonavir  100 mg Oral QHS  . sodium chloride  3 mL Intravenous Q12H   Continuous Infusions:   PRN Meds: acetaminophen, acetaminophen, albuterol, gabapentin, guaiFENesin-dextromethorphan, ondansetron (ZOFRAN) IV, ondansetron, tiZANidine  Assessment/Plan: Principal Problem:   COPD exacerbation Active Problems:   HIV INFECTION   Bronchitis, acute   Prostate cancer  Casey Brennan is a 53 y.o. male with a history of well-controlled HIV (1988), asthma, prostate cancer s/p prostatectomy (08/2013), post concussion syndrome, severe frontal headaches who presents with shortness of breath, gradual headache with photophobia, and low back pain not correctable by inalers after cutting grass on 7/29.   Shortness of breath/Headache/Back pain:  Leading diagnoses for this constellation of symptoms are COPD exacerbation vs acute bronchitis. ED workup showed CT head, CT lumbar spine, CTA chest with and without contrast, and CT Abdomen with contrast negative which combined with patient's presentation, makes pneumonia, PE, meningitis, and prostate cancer metastasis unlikely.   The patient received 1.4 L NS yesterday due to elevated BUN, but will receive more to try to protect his kidneys from the large amount of contrast he received yesterday during work up.   We are planning to discontinue fluticasone because of the risk of adrenal insufficiency and the patient is already on Qvar. Patient does not yet carries the diagnosis of  asthma despite being on a treatment regimen closer to COPD. He should receive PFTs on clinic follow up to evaluate his function since this last dyspneic event.  Patient had 5-6/10 headaches 02/02/14 and was requesting dilaudid which he received in the ED on admission without incident, despite his recorded allergy to morphine. He received prochlorperazine, diphenhydramine and ketorolac for headaches, however he still complained of a 4/10 severity and was upset that we had given him a medication that was also used for schizophrenia. Patient reports zanaflex 6 mg and gabapentin 300 mg has worked in the past for his headaches, so this was added PRN. He requested the zanaflex, ketorolac, and diphenhydramine combination overnight for pain relief, and that has continued through the morning.   Levaquin was switched to doxycycline BID for 5-7 days for optimization of therapy. The patient was observed overnight on doxycycline PO and did well. Plan to continue treatment for 4-6 days. Discharge is planned for this morning. Scheduled follow up as below. -- Continue Tizanidine 6  mg, Ketorolac 15 mg, and Diphenhydramine 12.5 mg PRN -- Continue doxycycline BID for 5-7 days  -- Continue home tizanidine and naproxen PRN for headache -- Pulm follow up with Dr. Melvyn Novas, Aug 10, at 11 AM  Chest pain: Patient received EKG on admission for complaints of substernal chest pain and it showed new ST elevations in leads V3 and V5, so troponins x3 q6hrs were ordered. -- Troponin (-) x3  HIV: Patient's HIV is well controlled with last CD4 430/22% on 10/13/13. -- Continue raltegravir, ritonavir, darunavir, and emtricitabine-tenofovir  Prostate cancer: Patient had a prostatectomy performed in 2015. There was some concern over metastatic cancer to the lumbar spine as the source of the patient's worsened back pain. Imaging was negative for metastases.  DVT Prophylaxis: -- Continue enoxaparin  Dispo: -- Discharge today --  Pulmonology follow up with Dr. Melvyn Novas on August 10 at 11:00 AM  This is a Careers information officer Note.  The care of the patient was discussed with Dr. Dellia Nims and the assessment and plan formulated with their assistance.  Please see their attached note for official documentation of the daily encounter.   LOS: 2 days   Tommie Sams, Med Student 02/03/2014, 7:34 AM

## 2014-02-03 NOTE — Progress Notes (Signed)
  I have seen and examined the patient, and reviewed the daily progress note by Garret Reddish, MS III and discussed the care of the patient with them. Please see my progress note from 02/03/2014 for further details regarding assessment and plan.    Signed:  Dellia Nims, MD 02/03/2014, 5:03 PM

## 2014-02-03 NOTE — Progress Notes (Addendum)
  I have seen and examined the patient, and reviewed the daily progress note by Tommie Sams, MS III and discussed the care of the patient with them. Please see my progress note from 02/03/2014 for further details regarding assessment and plan.    Signed:  Dellia Nims, MD 02/03/2014, 9:39 AM

## 2014-02-03 NOTE — Progress Notes (Signed)
Ambulated patient down hallway. Patient maintaining sats 94% on RA on exertion. No complaints of SOB.

## 2014-02-04 NOTE — Discharge Summary (Signed)
  Date: 02/04/2014  Patient name: Casey Brennan  Medical record number: [id number]  Date of birth: Mar 14, 1961   This patient has been seen and the plan of care was discussed with the house staff. Please see their note for complete details. I concur with their findings and the discharge plan as outlined by Dr. Genene Churn.  Carlyle Basques, MD 02/04/2014, 7:23 PM

## 2014-02-09 ENCOUNTER — Inpatient Hospital Stay: Payer: Medicare Other | Admitting: Internal Medicine

## 2014-02-13 ENCOUNTER — Inpatient Hospital Stay: Payer: Medicare Other | Admitting: Internal Medicine

## 2014-02-13 ENCOUNTER — Encounter: Payer: Self-pay | Admitting: Internal Medicine

## 2014-02-13 ENCOUNTER — Ambulatory Visit (INDEPENDENT_AMBULATORY_CARE_PROVIDER_SITE_OTHER): Payer: Medicare Other | Admitting: Internal Medicine

## 2014-02-13 VITALS — BP 109/78 | HR 84 | Temp 97.4°F | Ht 69.0 in | Wt 211.6 lb

## 2014-02-13 DIAGNOSIS — J209 Acute bronchitis, unspecified: Secondary | ICD-10-CM | POA: Diagnosis not present

## 2014-02-13 DIAGNOSIS — G4700 Insomnia, unspecified: Secondary | ICD-10-CM

## 2014-02-13 DIAGNOSIS — R079 Chest pain, unspecified: Secondary | ICD-10-CM

## 2014-02-13 DIAGNOSIS — R51 Headache: Secondary | ICD-10-CM | POA: Diagnosis not present

## 2014-02-13 DIAGNOSIS — R0789 Other chest pain: Secondary | ICD-10-CM | POA: Insufficient documentation

## 2014-02-13 MED ORDER — LEVALBUTEROL TARTRATE 45 MCG/ACT IN AERO: 4.0000 | INHALATION_SPRAY | Freq: Four times a day (QID) | RESPIRATORY_TRACT | Status: DC | PRN

## 2014-02-13 MED ORDER — ONDANSETRON HCL 4 MG PO TABS: 4.0000 mg | ORAL_TABLET | Freq: Four times a day (QID) | ORAL | Status: DC | PRN

## 2014-02-13 MED ORDER — TIZANIDINE HCL 6 MG PO CAPS: 6.0000 mg | ORAL_CAPSULE | Freq: Three times a day (TID) | ORAL | Status: DC | PRN

## 2014-02-13 MED ORDER — ZOLPIDEM TARTRATE ER 12.5 MG PO TBCR: 12.5000 mg | EXTENDED_RELEASE_TABLET | Freq: Every evening | ORAL | Status: DC | PRN

## 2014-02-13 MED ORDER — LEVALBUTEROL HCL 0.63 MG/3ML IN NEBU: 0.6300 mg | INHALATION_SOLUTION | Freq: Three times a day (TID) | RESPIRATORY_TRACT | Status: DC | PRN

## 2014-02-13 MED ORDER — IPRATROPIUM BROMIDE 0.02 % IN SOLN: 0.5000 mg | Freq: Three times a day (TID) | RESPIRATORY_TRACT | Status: DC | PRN

## 2014-02-13 NOTE — Patient Instructions (Addendum)
It was a pleasure taking care of you today, Casey Brennan.  Here is a summary of what we discussed:  1. Hospital follow-up for bronchitis: - Continue taking inhalers - Finish last dose of doxycycline  2. Headaches - Continue Gabapentin and Zanaflex  General Instructions:   Please bring your medicines with you each time you come to clinic.  Medicines may include prescription medications, over-the-counter medications, herbal remedies, eye drops, vitamins, or other pills.   Progress Toward Treatment Goals:  No flowsheet data found.  Self Care Goals & Plans:  Self Care Goal 11/21/2012  Manage my medications take my medicines as prescribed; bring my medications to every visit; refill my medications on time  Eat healthy foods drink diet soda or water instead of juice or soda; eat more vegetables; eat foods that are low in salt; eat baked foods instead of fried foods    No flowsheet data found.   Care Management & Community Referrals:  No flowsheet data found.

## 2014-02-13 NOTE — Assessment & Plan Note (Signed)
Pt hospitalized on 02/02/14-02/03/14 for acute bronchitis with asthma exacerbation after presenting with 5 day hx of fever and productive cough. Pt now only complaining of dry cough. He had a mild fever (100.9) last night that resolved with Aleve.  - Pt instructed to avoid triggers for his asthma (grass last trigger) - Continue inhalers - Complete last dose of doxycycline today - Monitor for additional fevers, productive cough. Pt instructed to come back to clinic if symptoms reoccur.

## 2014-02-13 NOTE — Progress Notes (Signed)
INTERNAL MEDICINE TEACHING ATTENDING ADDENDUM - Aldine Contes, MD: I personally saw and evaluated Mr. Laviolette in this clinic visit in conjunction with the resident, Dr. Arcelia Jew. I have discussed patient's plan of care with medical resident during this visit. I have confirmed the physical exam findings and have read and agree with the clinic note including the plan with the following addition: - Patient here for hospital follow up for bronchitis - Doing better now - On exam: Lungs - CTA b/l, Cardio - RRR - Complete course of doxycycline - Trial of ambien for insomnia

## 2014-02-13 NOTE — Assessment & Plan Note (Addendum)
Pt had CP during hospital admission 8/3. EKG showed ST elevations in II, V3, and V5. Repeat EKG showed improvement in leads V3, V5 and persistent elevation in II. Troponins were negative x 3. Pt denies CP, SOB, palpitations, orthopnea, and dyspnea with exertion. Pt does not have typical risk factors of DM, HTN, or HLD. I do not believe he needs a cardiology referral at this time. Will reconsider if patient becomes symptomatic.

## 2014-02-13 NOTE — Assessment & Plan Note (Signed)
Pt complaining of insomnia. Ambien has worked in past. - Short term trial of Ambien - Pt instructed to take Ambien only when he absolutely needs to
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
## 2014-02-13 NOTE — Assessment & Plan Note (Signed)
Hx of chronic headaches. Pt states he had a severe headache in the hospital and only a mild headache last night while febrile. He currently does not have a headache and neuro exam nonfocal.  - Continue Gabapentin and Zanaflex

## 2014-02-25 ENCOUNTER — Other Ambulatory Visit: Payer: Self-pay | Admitting: Internal Medicine

## 2014-03-05 ENCOUNTER — Ambulatory Visit (INDEPENDENT_AMBULATORY_CARE_PROVIDER_SITE_OTHER): Payer: Medicare Other | Admitting: Neurology

## 2014-03-05 ENCOUNTER — Encounter: Payer: Self-pay | Admitting: Neurology

## 2014-03-05 VITALS — BP 125/80 | HR 88 | Resp 18 | Ht 67.5 in | Wt 212.0 lb

## 2014-03-05 DIAGNOSIS — G44229 Chronic tension-type headache, not intractable: Secondary | ICD-10-CM | POA: Diagnosis not present

## 2014-03-05 DIAGNOSIS — R51 Headache: Secondary | ICD-10-CM | POA: Diagnosis not present

## 2014-03-05 MED ORDER — GABAPENTIN 300 MG PO CAPS: 300.0000 mg | ORAL_CAPSULE | Freq: Four times a day (QID) | ORAL | Status: DC | PRN

## 2014-03-05 MED ORDER — TIZANIDINE HCL 6 MG PO CAPS: 6.0000 mg | ORAL_CAPSULE | Freq: Three times a day (TID) | ORAL | Status: DC | PRN

## 2014-03-05 MED ORDER — OXYMETAZOLINE HCL 0.05 % NA SOLN: 1.0000 | Freq: Two times a day (BID) | NASAL | Status: DC

## 2014-03-05 NOTE — Patient Instructions (Signed)
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
## 2014-03-20 DIAGNOSIS — B369 Superficial mycosis, unspecified: Secondary | ICD-10-CM | POA: Diagnosis not present

## 2014-04-01 ENCOUNTER — Other Ambulatory Visit: Payer: Self-pay | Admitting: Internal Medicine

## 2014-04-01 ENCOUNTER — Other Ambulatory Visit: Payer: Self-pay | Admitting: Infectious Diseases

## 2014-04-02 ENCOUNTER — Telehealth: Payer: Self-pay | Admitting: *Deleted

## 2014-04-02 NOTE — Telephone Encounter (Signed)
Pharmacy [facility]  is requesting a refill on Ciprofloxacin 500mg  #14 - last refill 06/04/13. [name] RN 04/02/14 2:30PM

## 2014-04-07 DIAGNOSIS — C61 Malignant neoplasm of prostate: Secondary | ICD-10-CM | POA: Diagnosis not present

## 2014-04-09 NOTE — Telephone Encounter (Signed)
Not sure why requesting refill for Ciprofloxacin. Patient does not need to be on Cipro at this time.

## 2014-04-13 ENCOUNTER — Other Ambulatory Visit: Payer: Medicare Other

## 2014-04-14 ENCOUNTER — Other Ambulatory Visit: Payer: Medicare Other

## 2014-04-14 DIAGNOSIS — Z113 Encounter for screening for infections with a predominantly sexual mode of transmission: Secondary | ICD-10-CM

## 2014-04-14 DIAGNOSIS — Z79899 Other long term (current) drug therapy: Secondary | ICD-10-CM

## 2014-04-14 DIAGNOSIS — N393 Stress incontinence (female) (male): Secondary | ICD-10-CM | POA: Diagnosis not present

## 2014-04-14 DIAGNOSIS — B20 Human immunodeficiency virus [HIV] disease: Secondary | ICD-10-CM | POA: Diagnosis not present

## 2014-04-14 DIAGNOSIS — N5231 Erectile dysfunction following radical prostatectomy: Secondary | ICD-10-CM | POA: Diagnosis not present

## 2014-04-14 DIAGNOSIS — C61 Malignant neoplasm of prostate: Secondary | ICD-10-CM | POA: Diagnosis not present

## 2014-04-15 LAB — COMPREHENSIVE METABOLIC PANEL
ALT: <8 U/L (ref 0–53)
AST: 12 U/L (ref 0–37)
Albumin: 4.1 g/dL (ref 3.5–5.2)
Alkaline Phosphatase: 121 U/L — ABNORMAL HIGH (ref 39–117)
BUN: 12 mg/dL (ref 6–23)
CO2: 25 mEq/L (ref 19–32)
Calcium: 8.9 mg/dL (ref 8.4–10.5)
Chloride: 105 mEq/L (ref 96–112)
Creat: 1.33 mg/dL (ref 0.50–1.35)
Glucose, Bld: 94 mg/dL (ref 70–99)
Potassium: 4.1 mEq/L (ref 3.5–5.3)
Sodium: 139 mEq/L (ref 135–145)
Total Bilirubin: 0.7 mg/dL (ref 0.2–1.2)
Total Protein: 7.2 g/dL (ref 6.0–8.3)

## 2014-04-15 LAB — T-HELPER CELL (CD4) - (RCID CLINIC ONLY)
CD4 % Helper T Cell: 22 % — ABNORMAL LOW (ref 33–55)
CD4 T Cell Abs: 490 /uL (ref 400–2700)

## 2014-04-15 LAB — CBC
HCT: 31.5 % — ABNORMAL LOW (ref 39.0–52.0)
Hemoglobin: 9 g/dL — ABNORMAL LOW (ref 13.0–17.0)
MCH: 23.3 pg — ABNORMAL LOW (ref 26.0–34.0)
MCHC: 28.6 g/dL — ABNORMAL LOW (ref 30.0–36.0)
MCV: 81.4 fL (ref 78.0–100.0)
Platelets: 250 10*3/uL (ref 150–400)
RBC: 3.87 MIL/uL — ABNORMAL LOW (ref 4.22–5.81)
RDW: 27 % — ABNORMAL HIGH (ref 11.5–15.5)
WBC: 6.2 10*3/uL (ref 4.0–10.5)

## 2014-04-15 LAB — HIV-1 RNA QUANT-NO REFLEX-BLD
HIV 1 RNA Quant: <20 copies/mL (ref <20)
HIV-1 RNA Quant, Log: <1.3 {Log} (ref <1.30)

## 2014-04-15 LAB — LIPID PANEL
Cholesterol: 149 mg/dL (ref 0–200)
HDL: 45 mg/dL (ref >39)
LDL Cholesterol: 79 mg/dL (ref 0–99)
Total CHOL/HDL Ratio: 3.3 Ratio
Triglycerides: 126 mg/dL (ref <150)
VLDL: 25 mg/dL (ref 0–40)

## 2014-04-15 LAB — RPR

## 2014-04-17 ENCOUNTER — Encounter: Payer: Self-pay | Admitting: Internal Medicine

## 2014-04-17 ENCOUNTER — Encounter: Payer: Medicare Other | Admitting: Internal Medicine

## 2014-04-27 ENCOUNTER — Ambulatory Visit (INDEPENDENT_AMBULATORY_CARE_PROVIDER_SITE_OTHER): Payer: Medicare Other | Admitting: Infectious Diseases

## 2014-04-27 ENCOUNTER — Encounter: Payer: Self-pay | Admitting: Infectious Diseases

## 2014-04-27 VITALS — BP 159/97 | HR 71 | Temp 97.8°F | Wt 212.0 lb

## 2014-04-27 DIAGNOSIS — Z8679 Personal history of other diseases of the circulatory system: Secondary | ICD-10-CM | POA: Insufficient documentation

## 2014-04-27 DIAGNOSIS — M25569 Pain in unspecified knee: Secondary | ICD-10-CM | POA: Insufficient documentation

## 2014-04-27 DIAGNOSIS — J329 Chronic sinusitis, unspecified: Secondary | ICD-10-CM

## 2014-04-27 DIAGNOSIS — B20 Human immunodeficiency virus [HIV] disease: Secondary | ICD-10-CM

## 2014-04-27 DIAGNOSIS — M25561 Pain in right knee: Secondary | ICD-10-CM

## 2014-04-27 DIAGNOSIS — D649 Anemia, unspecified: Secondary | ICD-10-CM

## 2014-04-27 DIAGNOSIS — Z79899 Other long term (current) drug therapy: Secondary | ICD-10-CM | POA: Diagnosis not present

## 2014-04-27 DIAGNOSIS — Z23 Encounter for immunization: Secondary | ICD-10-CM

## 2014-04-27 DIAGNOSIS — I10 Essential (primary) hypertension: Secondary | ICD-10-CM

## 2014-04-27 DIAGNOSIS — Z113 Encounter for screening for infections with a predominantly sexual mode of transmission: Secondary | ICD-10-CM | POA: Diagnosis not present

## 2014-04-27 MED ORDER — ONDANSETRON HCL 4 MG PO TABS
4.0000 mg | ORAL_TABLET | Freq: Three times a day (TID) | ORAL | Status: DC | PRN
Start: 2014-04-27 — End: 2014-09-16

## 2014-04-27 MED ORDER — CYANOCOBALAMIN 1000 MCG/ML IJ SOLN
1000.0000 ug | Freq: Once | INTRAMUSCULAR | Status: AC
  Administered 2014-04-27: 1000 ug via INTRAMUSCULAR

## 2014-04-27 MED ORDER — OXYMETAZOLINE HCL 0.05 % NA SOLN: 1.0000 | Freq: Two times a day (BID) | NASAL | Status: DC

## 2014-04-27 NOTE — Progress Notes (Signed)
   Subjective:    Patient ID: Casey Brennan, male    DOB: May 17, 1961, 53 y.o.   MRN: [id number]  HPI 53 yo M with hx HIV+ and prostate Ca. Currently taking ISN/TRV/DRVr.  Had radical prostatectomy 08-18-13, adenoCA, LN (-), margins (-).  Has been feeling well.  Had R knee arthroscopy 1985, has been having some throbbing pain recently. Minimal relief with goody's, alleve, xanaflex.   Has been having sinus and ear drainage. Was seen by ENT. Takes claritin daily.   HIV 1 RNA Quant (copies/mL)  Date Value  04/14/2014 <20   10/13/2013 <20   05/21/2013 <20      CD4 T Cell Abs (/uL)  Date Value  04/14/2014 490   10/13/2013 430   05/21/2013 330*     Review of Systems See HPI.     Objective:   Physical Exam  Constitutional: He appears well-developed and well-nourished.  HENT:  Right Ear: No drainage. Tympanic membrane is retracted. No middle ear effusion.  Left Ear: No drainage. Tympanic membrane is retracted.  No middle ear effusion.  Mouth/Throat: No oropharyngeal exudate.  Eyes: EOM are normal. Pupils are equal, round, and reactive to light.  Neck: Neck supple.  Cardiovascular: Normal rate, regular rhythm and normal heart sounds.   Pulmonary/Chest: Effort normal and breath sounds normal.  Abdominal: Soft. Bowel sounds are normal. He exhibits no distension. There is no tenderness.  Lymphadenopathy:    He has no cervical adenopathy.          Assessment & Plan:

## 2014-04-27 NOTE — Addendum Note (Signed)
Addended by: Myrtis Hopping A on: 04/27/2014 10:33 AM   Modules accepted: Orders

## 2014-04-27 NOTE — Assessment & Plan Note (Signed)
Slightly better at this f/u.

## 2014-04-27 NOTE — Assessment & Plan Note (Signed)
Will continue OTCs prn.

## 2014-04-27 NOTE — Assessment & Plan Note (Signed)
Will have him seen by PCP

## 2014-04-27 NOTE — Assessment & Plan Note (Signed)
He is on claritin. May try alternate rx.  i wrote med exemption form for qnasl as his care givers request.

## 2014-04-27 NOTE — Assessment & Plan Note (Addendum)
Doing well on his current salvage rx. He is accompanied by his primary care giver, she mostly navigates the conversation for him.  He is offered/refuses condoms. He is given B12, flu shots. Will see him back in 6 months.  Consider referral for colonoscopy at f/u.

## 2014-04-30 ENCOUNTER — Encounter: Payer: Self-pay | Admitting: Infectious Diseases

## 2014-04-30 ENCOUNTER — Ambulatory Visit (INDEPENDENT_AMBULATORY_CARE_PROVIDER_SITE_OTHER): Payer: Medicare Other | Admitting: Infectious Diseases

## 2014-04-30 VITALS — BP 131/94 | HR 87 | Temp 98.7°F | Ht 69.0 in | Wt 212.0 lb

## 2014-04-30 DIAGNOSIS — B20 Human immunodeficiency virus [HIV] disease: Secondary | ICD-10-CM

## 2014-04-30 DIAGNOSIS — M25561 Pain in right knee: Secondary | ICD-10-CM

## 2014-04-30 NOTE — Assessment & Plan Note (Signed)
He is here today to have a letter written for a legal case regarding a HIPPA violation that occurred when he was first diagnosed. I have written a letter that supports that he was dx with HIV/AIDS, he has since been cared for by [place] and has improved dramatically.

## 2014-04-30 NOTE — Assessment & Plan Note (Signed)
His care partner feels that the OTCs are not helping, would like MRI. I will order.

## 2014-04-30 NOTE — Progress Notes (Signed)
   Subjective:    Patient ID: Casey Brennan, male    DOB: [birth year], 53 y.o.   MRN: [id number]  HPI 53 yo M with hx HIV+ and prostate Ca. Currently taking ISN/TRV/DRVr.  Had radical prostatectomy 08-18-13, adenoCA, LN (-), margins (-).  Has been feeling well.  Had R knee arthroscopy 1985, has been having some throbbing pain recently. Minimal relief with goody's, alleve, xanaflex.  Has been having sinus and ear drainage. Was seen by ENT. Takes claritin daily.  Seen 3 days ago, doing well.  Needs a letter for a legal case in [place].   Review of Systems     Objective:   Physical Exam        Assessment & Plan:

## 2014-05-01 ENCOUNTER — Ambulatory Visit
Admission: RE | Admit: 2014-05-01 | Discharge: 2014-05-01 | Disposition: A | Discharge status: Home / Self Care | Payer: Medicare Other | Source: Ambulatory Visit | Attending: Infectious Diseases | Admitting: Infectious Diseases

## 2014-05-01 DIAGNOSIS — M23251 Derangement of posterior horn of lateral meniscus due to old tear or injury, right knee: Secondary | ICD-10-CM | POA: Diagnosis not present

## 2014-05-01 DIAGNOSIS — M1711 Unilateral primary osteoarthritis, right knee: Secondary | ICD-10-CM | POA: Diagnosis not present

## 2014-05-01 DIAGNOSIS — M25561 Pain in right knee: Secondary | ICD-10-CM

## 2014-05-01 MED ORDER — GADOBENATE DIMEGLUMINE 529 MG/ML IV SOLN
20.0000 mL | Freq: Once | INTRAVENOUS | Status: AC | PRN
  Administered 2014-05-01: 20 mL via INTRAVENOUS

## 2014-05-04 ENCOUNTER — Telehealth: Payer: Self-pay | Admitting: *Deleted

## 2014-05-04 NOTE — Telephone Encounter (Signed)
Patient notified of appointment with Dr. Maxie Better at [facility] for Wednesday 05/06/14 at 10:00 AM. Notes, insurance and demographics faxed to [phone]. Myrtis Hopping

## 2014-05-04 NOTE — Telephone Encounter (Signed)
-----   Message from Campbell Riches, MD sent at 05/04/2014 11:43 AM EST ----- PT needs ortho eval ASAP  ----- Message -----    From: Rad Results In Interface    Sent: 05/01/2014  10:39 AM      To: Campbell Riches, MD

## 2014-05-06 DIAGNOSIS — M25561 Pain in right knee: Secondary | ICD-10-CM | POA: Diagnosis not present

## 2014-05-22 ENCOUNTER — Other Ambulatory Visit: Payer: Self-pay | Admitting: Internal Medicine

## 2014-05-26 NOTE — Telephone Encounter (Signed)
30 day supply in July. Last note indicated Ambien used for insomnia.

## 2014-06-01 ENCOUNTER — Ambulatory Visit: Payer: Medicare Other | Admitting: Infectious Diseases

## 2014-06-01 ENCOUNTER — Telehealth: Payer: Self-pay | Admitting: *Deleted

## 2014-06-01 NOTE — Telephone Encounter (Signed)
Attempted to call patient regarding paperwork for inhalers and his phone is not accepting calls. Per Dr. [name] this paperwork should be filled out by patient's PCP, Dr. Albin Felling. Should patient call regarding the paperwork; it is upfront in brown folder to be picked up. Myrtis Hopping

## 2014-06-15 DIAGNOSIS — K219 Gastro-esophageal reflux disease without esophagitis: Secondary | ICD-10-CM | POA: Diagnosis not present

## 2014-06-15 DIAGNOSIS — R1011 Right upper quadrant pain: Secondary | ICD-10-CM | POA: Diagnosis not present

## 2014-06-16 ENCOUNTER — Other Ambulatory Visit: Payer: Self-pay | Admitting: Gastroenterology

## 2014-06-16 DIAGNOSIS — R1011 Right upper quadrant pain: Secondary | ICD-10-CM

## 2014-06-22 ENCOUNTER — Ambulatory Visit
Admission: RE | Admit: 2014-06-22 | Discharge: 2014-06-22 | Disposition: A | Discharge status: Home / Self Care | Payer: Medicare Other | Source: Ambulatory Visit | Attending: Gastroenterology | Admitting: Gastroenterology

## 2014-06-22 ENCOUNTER — Other Ambulatory Visit: Payer: Self-pay | Admitting: Internal Medicine

## 2014-06-22 ENCOUNTER — Other Ambulatory Visit: Payer: Self-pay | Admitting: Infectious Diseases

## 2014-06-22 DIAGNOSIS — B351 Tinea unguium: Secondary | ICD-10-CM

## 2014-06-22 DIAGNOSIS — R1011 Right upper quadrant pain: Secondary | ICD-10-CM

## 2014-06-22 DIAGNOSIS — K8020 Calculus of gallbladder without cholecystitis without obstruction: Secondary | ICD-10-CM | POA: Diagnosis not present

## 2014-06-22 DIAGNOSIS — N281 Cyst of kidney, acquired: Secondary | ICD-10-CM | POA: Diagnosis not present

## 2014-06-22 DIAGNOSIS — M25569 Pain in unspecified knee: Secondary | ICD-10-CM

## 2014-06-23 ENCOUNTER — Telehealth: Payer: Self-pay | Admitting: *Deleted

## 2014-06-23 NOTE — Telephone Encounter (Signed)
Patient requesting refills of voltaren gel for joint pain and mupirocin ointment for toes.  Patient is now being treated by ortho.  The mupriocin was prescribed for onchymycosis/maceration between the toes.  Unable to reach the patient by phone to see if he still needs this.  Please advise. Landis Gandy, RN

## 2014-06-24 ENCOUNTER — Ambulatory Visit (INDEPENDENT_AMBULATORY_CARE_PROVIDER_SITE_OTHER): Payer: Medicare Other | Admitting: Infectious Diseases

## 2014-06-24 ENCOUNTER — Encounter: Payer: Self-pay | Admitting: Infectious Diseases

## 2014-06-24 VITALS — BP 152/97 | HR 96 | Temp 98.2°F | Wt 215.0 lb

## 2014-06-24 DIAGNOSIS — F0630 Mood disorder due to known physiological condition, unspecified: Secondary | ICD-10-CM | POA: Diagnosis not present

## 2014-06-24 DIAGNOSIS — B20 Human immunodeficiency virus [HIV] disease: Secondary | ICD-10-CM | POA: Diagnosis not present

## 2014-06-24 DIAGNOSIS — K8020 Calculus of gallbladder without cholecystitis without obstruction: Secondary | ICD-10-CM

## 2014-06-24 DIAGNOSIS — M25561 Pain in right knee: Secondary | ICD-10-CM | POA: Diagnosis not present

## 2014-06-24 MED ORDER — CYANOCOBALAMIN 1000 MCG/ML IJ SOLN
1000.0000 ug | Freq: Once | INTRAMUSCULAR | Status: AC
  Administered 2014-06-24: 1000 ug via INTRAMUSCULAR

## 2014-06-24 NOTE — Assessment & Plan Note (Signed)
My great appreciation to Dr Michail Sermon. ? Cause of his sporadic fevers.

## 2014-06-24 NOTE — Assessment & Plan Note (Signed)
Will have him seen by counselor here in clinic.

## 2014-06-24 NOTE — Telephone Encounter (Signed)
Please refill when you can contact pt

## 2014-06-24 NOTE — Assessment & Plan Note (Signed)
Appears to be doing well. Will see him back in 6 months.

## 2014-06-24 NOTE — Assessment & Plan Note (Signed)
My great appreciation to Dr Amedeo Plenty. Possible synvisc injection in future.

## 2014-06-24 NOTE — Progress Notes (Signed)
   Subjective:    Patient ID: Casey Brennan, male    DOB: 1961-05-14, 53 y.o.   MRN: [id number]  HPI 53 yo M with hx HIV+ and prostate Ca. Currently taking ISN/TRV/DRVr.  Had radical prostatectomy 08-18-13, adenoCA, LN (-), margins (-).  Has been feeling well.  Had R knee arthroscopy 1985, has been having pain recently. In October he had MRI to f/u this:  1. Tricompartmental osteoarthritis of the right knee, most severe in the medial femorotibial compartment with there is extensive full-thickness cartilage loss. There is mild smooth is synovial enhancement likely related to underlying osteoarthritis. 2. Severe attenuation of the body and posterior horn of the medial meniscus which may be secondary to a severe degenerative tear versus prior meniscectomy. 3. Complex tear of the posterior horn- body junction of the lateral meniscus. Possible radial tear of the posterior horn of the lateral meniscus adjacent to the meniscal root. 4. Chronic, complete ACL tear. Was seen by ortho, got brace, has f/u appt in January (? synvisc).    Was seen by GI last week for abd, had u/s showing cholelithiasis and renal cysts. To have endoscopies in January.  Today complains of occas fever, ;abd distension, cough, SOB, fatigue. Has been having reflux as well. Occas clear mucous. occas yellow after nebulizer.    HIV 1 RNA QUANT (copies/mL)  Date Value  04/14/2014 <20  10/13/2013 <20  05/21/2013 <20   CD4 T CELL ABS (/uL)  Date Value  04/14/2014 490  10/13/2013 430  05/21/2013 330*   would like B12 shot.   Review of Systems  Gastrointestinal: Positive for abdominal distention. Negative for abdominal pain, diarrhea and constipation.       Objective:   Physical Exam  Constitutional: He appears well-developed and well-nourished.  HENT:  Mouth/Throat: No oropharyngeal exudate.  Eyes: EOM are normal. Pupils are equal, round, and reactive to light.  Neck: Neck supple.  Cardiovascular:  Normal rate, regular rhythm and normal heart sounds.   Pulmonary/Chest: Effort normal and breath sounds normal.  Abdominal: Soft. Bowel sounds are normal. A hernia is present.    Lymphadenopathy:    He has no cervical adenopathy.          Assessment & Plan:

## 2014-06-24 NOTE — Addendum Note (Signed)
Addended by: Myrtis Hopping A on: 06/24/2014 04:39 PM   Modules accepted: Orders

## 2014-07-08 ENCOUNTER — Ambulatory Visit: Payer: Medicare Other

## 2014-07-20 DIAGNOSIS — M25561 Pain in right knee: Secondary | ICD-10-CM | POA: Diagnosis not present

## 2014-07-22 ENCOUNTER — Encounter: Payer: Self-pay | Admitting: Internal Medicine

## 2014-07-22 ENCOUNTER — Ambulatory Visit (INDEPENDENT_AMBULATORY_CARE_PROVIDER_SITE_OTHER): Payer: Medicare Other | Admitting: Internal Medicine

## 2014-07-22 ENCOUNTER — Ambulatory Visit (INDEPENDENT_AMBULATORY_CARE_PROVIDER_SITE_OTHER)
Admission: RE | Admit: 2014-07-22 | Discharge: 2014-07-22 | Disposition: A | Discharge status: Home / Self Care | Payer: Medicare Other | Source: Ambulatory Visit | Attending: Internal Medicine | Admitting: Internal Medicine

## 2014-07-22 VITALS — BP 150/92 | HR 90 | Temp 97.9°F | Ht 69.0 in | Wt 213.0 lb

## 2014-07-22 DIAGNOSIS — J449 Chronic obstructive pulmonary disease, unspecified: Secondary | ICD-10-CM | POA: Diagnosis not present

## 2014-07-22 DIAGNOSIS — R05 Cough: Secondary | ICD-10-CM

## 2014-07-22 DIAGNOSIS — R059 Cough, unspecified: Secondary | ICD-10-CM

## 2014-07-22 DIAGNOSIS — J189 Pneumonia, unspecified organism: Secondary | ICD-10-CM | POA: Diagnosis not present

## 2014-07-22 MED ORDER — BECLOMETHASONE DIPROPIONATE 80 MCG/ACT NA AERS: 1.0000 | INHALATION_SPRAY | Freq: Two times a day (BID) | NASAL | Status: DC

## 2014-07-22 MED ORDER — FAMOTIDINE 20 MG PO TABS: 20.0000 mg | ORAL_TABLET | Freq: Two times a day (BID) | ORAL | Status: DC

## 2014-07-22 MED ORDER — MOMETASONE FURO-FORMOTEROL FUM 200-5 MCG/ACT IN AERO: INHALATION_SPRAY | RESPIRATORY_TRACT | Status: DC

## 2014-07-22 NOTE — Progress Notes (Signed)
Quick Note:  lmtcb ______ 

## 2014-07-22 NOTE — Progress Notes (Signed)
Subjective:    Patient ID: Casey Brennan, male    DOB: 25-Mar-1961,   MRN: [id number]  HPI  67 yobm quit smoking 2011 dx with HIV 1988 referred to pulmonary clinic 07/22/2014 by Dr [name] for variable sob/ ? Asthma vs copd .   07/22/2014 1st [facility] office visit/ Casey Brennan   Chief Complaint  Patient presents with  . Pulmonary Consult    Referred by Dr. Bobby Rumpf. Pt was dxed with asthma in 1966. He has had PNA multiple times. Pt c/o increased SOB and wheezing for the past 2 months. He has occ cough which is non prod. He also c/o "pounding pain" in chest for the past 2 wks.   breathing hasn't been nl since 2012 and using a lot of saba but not whether using maint rx or not > rx qvar  By [employer] but did not have apparent impact on saba need  No obvious other patterns in day to day or daytime variabilty or assoc excess or purulent mucus  or clasically pleuritic or ex cp or chest tightness, subjective wheeze overt sinus or hb symptoms. No unusual exp hx or h/o childhood pna/ asthma or knowledge of premature birth.  Sleeping ok without nocturnal  or early am exacerbation  of respiratory  c/o's or need for noct saba. Also denies any obvious fluctuation of symptoms with weather or environmental changes or other aggravating or alleviating factors except as outlined above   Current Medications, Allergies, Complete Past Medical History, Past Surgical History, Family History, and Social History were reviewed in [employer] record.              Review of Systems  Constitutional: Negative for fever, chills, activity change, appetite change and unexpected weight change.  HENT: Positive for sinus pressure. Negative for congestion, dental problem, postnasal drip, rhinorrhea, sneezing, sore throat, trouble swallowing and voice change.   Eyes: Negative for visual disturbance.  Respiratory: Positive for cough and shortness of breath. Negative for choking.     Cardiovascular: Positive for chest pain and leg swelling.  Gastrointestinal: Negative for nausea, vomiting and abdominal pain.  Genitourinary: Negative for difficulty urinating.  Musculoskeletal: Negative for arthralgias.  Skin: Negative for rash.  Psychiatric/Behavioral: Negative for behavioral problems and confusion.       Objective:   Physical Exam  amb mod obese bm nad/ unusual affect/ here with sister who does most of the talking   Wt Readings from Last 3 Encounters:  07/22/14 213 lb (96.616 kg)  06/24/14 215 lb (97.523 kg)  04/30/14 212 lb (96.163 kg)    Vital signs reviewed  HEENT: nl dentition, turbinates, and orophanx. Nl external ear canals without cough reflex   NECK :  without JVD/Nodes/TM/ nl carotid upstrokes bilaterally   LUNGS: no acc muscle use, clear to A and P bilaterally without cough on insp or exp maneuvers   CV:  RRR  no s3 or murmur or increase in P2, no edema   ABD:  soft and nontender with nl excursion in the supine position. No bruits or organomegaly, bowel sounds nl  MS:  warm without deformities, calf tenderness, cyanosis or clubbing  SKIN: warm and dry without lesions    NEURO:  alert, approp, no deficits  CXR PA and Lateral:   07/22/2014 :     I personally reviewed images and agree with radiology impression as follows:    no acute change, not hyperinflated  Assessment & Plan:

## 2014-07-22 NOTE — Patient Instructions (Addendum)
Prilosec 40 mg   Take 30-60 min before first meal of the day and Pepcid 20 mg one bedtime   GERD (REFLUX)  is an extremely common cause of respiratory symptoms just like yours , many times with no obvious heartburn at all.    It can be treated with medication, but also with lifestyle changes including avoidance of late meals, excessive alcohol, smoking cessation, and avoid fatty foods, chocolate, peppermint, colas, red wine, and acidic juices such as orange juice.  NO MINT OR MENTHOL PRODUCTS SO NO COUGH DROPS  USE SUGARLESS CANDY INSTEAD (Jolley ranchers or Stover's or Life Savers) or even ice chips will also do - the key is to swallow to prevent all throat clearing. NO OIL BASED VITAMINS - use powdered substitutes.    Dulera 200 Take 2 puffs first thing in am and then another 2 puffs about 12 hours later > fill the prescription only if you are happy with it.   Work on inhaler technique:  relax and gently blow all the way out then take a nice smooth deep breath back in, triggering the inhaler at same time you start breathing in.  Hold for up to 5 seconds if you can and let it out through your nose.  Rinse and gargle with water when done       Only use your xopenex  as a rescue medication to be used if you can't catch your breath by resting or doing a relaxed purse lip breathing pattern.  - The less you use it, the better it will work when you need it. - Ok to use up to 2 puffs  every 4 hours if you must but call for immediate appointment if use goes up over your usual need - Don't leave home without it !!  (think of it like the spare tire for your car)   Only use nebulizer if you already try the puffer and it doesn't work  Please remember to go to the  x-ray department downstairs for your tests - we will call you with the results when they are available.    If not happy return in 2 weeks, otherwise follow up is as needed or per Dr [name]

## 2014-07-22 NOTE — Assessment & Plan Note (Addendum)
-   spirometry 07/22/2014  FEV1  1.12 (34%) ratio 59 - 07/22/2014  Walked RA  2 laps @ 185 ft each stopped due to  Too tired to complete, no sob or tachypnea/ no desats at nl pace   Symptoms are variably severe and did not apparently improve p reported smoking cessation which is odd for copd/ab but clearly his problems are not simply asthma and remain difficult to control  DDX of  difficult airways management all start with A and  include Adherence, Ace Inhibitors, Acid Reflux, Active Sinus Disease, Alpha 1 Antitripsin deficiency, Anxiety masquerading as Airways dz,  ABPA,  allergy(esp in young), Aspiration (esp in elderly), Adverse effects of DPI,  Active smokers, plus two Bs  = Bronchiectasis and Beta blocker use..and one C= CHF  In this case Adherence is the biggest issue and starts with  inability to use HFA effectively and also  understand that SABA treats the symptoms but doesn't get to the underlying problem (inflammation).  I used  the analogy of putting steroid cream on a rash to help explain the meaning of topical therapy and the need to get the drug to the target tissue.    The proper method of use, as well as anticipated side effects, of a metered-dose inhaler are discussed and demonstrated to the patient. Improved effectiveness after extensive coaching during this visit to a level of approximately  75% from a baseline of 25% to try dulera 200 2bid  ? Acid (or non-acid) GERD > always difficult to exclude as up to 75% of pts in some series report no assoc GI/ Heartburn symptoms> rec max (24h)  acid suppression and diet restrictions/ reviewed and instructions given in writing.   ? Anxiety/depression > usually dx of exclusion though affect unusual and needs to be considered in the ddx     Each maintenance medication was reviewed in detail including most importantly the difference between maintenance and as needed and under what circumstances the prns are to be used.  Please see instructions for  details which were reviewed in writing and the patient given a copy.

## 2014-07-23 ENCOUNTER — Telehealth: Payer: Self-pay | Admitting: Internal Medicine

## 2014-07-23 NOTE — Telephone Encounter (Signed)
Result Note     Call pt: Reviewed cxr and no acute change (i see nothing at all clinically or radiographically to suggest pna) > no change in recs  ---  I spoke with patient about results and he verbalized understanding and had no questions. Pt sister/care giver aware as well

## 2014-07-23 NOTE — Progress Notes (Signed)
Quick Note:  LMTCB ______ 

## 2014-07-24 ENCOUNTER — Other Ambulatory Visit: Payer: Self-pay | Admitting: Infectious Diseases

## 2014-07-28 ENCOUNTER — Encounter (HOSPITAL_COMMUNITY): Payer: Self-pay | Admitting: *Deleted

## 2014-07-28 ENCOUNTER — Other Ambulatory Visit: Payer: Self-pay | Admitting: Gastroenterology

## 2014-07-28 NOTE — Addendum Note (Signed)
Addended by: Wilford Corner on: 07/28/2014 04:47 PM   Modules accepted: Orders

## 2014-07-29 ENCOUNTER — Ambulatory Visit (HOSPITAL_COMMUNITY): Payer: Medicare Other | Admitting: Certified Registered"

## 2014-07-29 ENCOUNTER — Encounter (HOSPITAL_COMMUNITY)
Admission: RE | Disposition: A | Discharge status: Home / Self Care | Payer: Self-pay | Source: Ambulatory Visit | Attending: Gastroenterology

## 2014-07-29 ENCOUNTER — Encounter (HOSPITAL_COMMUNITY): Payer: Self-pay | Admitting: *Deleted

## 2014-07-29 ENCOUNTER — Ambulatory Visit (HOSPITAL_COMMUNITY)
Admission: RE | Admit: 2014-07-29 | Discharge: 2014-07-29 | Disposition: A | Discharge status: Home / Self Care | Payer: Medicare Other | Source: Ambulatory Visit | Attending: Gastroenterology | Admitting: Gastroenterology

## 2014-07-29 DIAGNOSIS — R1011 Right upper quadrant pain: Secondary | ICD-10-CM | POA: Diagnosis present

## 2014-07-29 DIAGNOSIS — J45909 Unspecified asthma, uncomplicated: Secondary | ICD-10-CM | POA: Insufficient documentation

## 2014-07-29 DIAGNOSIS — K2970 Gastritis, unspecified, without bleeding: Secondary | ICD-10-CM | POA: Diagnosis not present

## 2014-07-29 DIAGNOSIS — K648 Other hemorrhoids: Secondary | ICD-10-CM | POA: Insufficient documentation

## 2014-07-29 DIAGNOSIS — Z6831 Body mass index (BMI) 31.0-31.9, adult: Secondary | ICD-10-CM | POA: Diagnosis not present

## 2014-07-29 DIAGNOSIS — R1010 Upper abdominal pain, unspecified: Secondary | ICD-10-CM | POA: Diagnosis not present

## 2014-07-29 DIAGNOSIS — K449 Diaphragmatic hernia without obstruction or gangrene: Secondary | ICD-10-CM | POA: Insufficient documentation

## 2014-07-29 DIAGNOSIS — Z87891 Personal history of nicotine dependence: Secondary | ICD-10-CM | POA: Diagnosis not present

## 2014-07-29 DIAGNOSIS — Z7982 Long term (current) use of aspirin: Secondary | ICD-10-CM | POA: Insufficient documentation

## 2014-07-29 DIAGNOSIS — K219 Gastro-esophageal reflux disease without esophagitis: Secondary | ICD-10-CM | POA: Diagnosis not present

## 2014-07-29 DIAGNOSIS — K2960 Other gastritis without bleeding: Secondary | ICD-10-CM | POA: Insufficient documentation

## 2014-07-29 DIAGNOSIS — J449 Chronic obstructive pulmonary disease, unspecified: Secondary | ICD-10-CM | POA: Insufficient documentation

## 2014-07-29 DIAGNOSIS — E669 Obesity, unspecified: Secondary | ICD-10-CM | POA: Diagnosis not present

## 2014-07-29 HISTORY — PX: COLONOSCOPY WITH PROPOFOL: SHX5780

## 2014-07-29 HISTORY — PX: ESOPHAGOGASTRODUODENOSCOPY (EGD) WITH PROPOFOL: SHX5813

## 2014-07-29 LAB — POCT I-STAT, CHEM 8
BUN: 6 mg/dL (ref 6–23)
Calcium, Ion: 1.25 mmol/L — ABNORMAL HIGH (ref 1.12–1.23)
Chloride: 102 mmol/L (ref 96–112)
Creatinine, Ser: 1.2 mg/dL (ref 0.50–1.35)
Glucose, Bld: 101 mg/dL — ABNORMAL HIGH (ref 70–99)
HCT: 34 % — ABNORMAL LOW (ref 39.0–52.0)
Hemoglobin: 11.6 g/dL — ABNORMAL LOW (ref 13.0–17.0)
Potassium: 3.6 mmol/L (ref 3.5–5.1)
Sodium: 142 mmol/L (ref 135–145)
TCO2: 22 mmol/L (ref 0–100)

## 2014-07-29 SURGERY — ESOPHAGOGASTRODUODENOSCOPY (EGD) WITH PROPOFOL
Anesthesia: Monitor Anesthesia Care

## 2014-07-29 MED ORDER — LACTATED RINGERS IV SOLN
INTRAVENOUS | Status: DC
  Administered 2014-07-29: 09:00:00 via INTRAVENOUS

## 2014-07-29 MED ORDER — PROPOFOL INFUSION 10 MG/ML OPTIME
INTRAVENOUS | Status: DC | PRN
  Administered 2014-07-29: 100 ug/kg/min via INTRAVENOUS

## 2014-07-29 MED ORDER — MIDAZOLAM HCL 5 MG/5ML IJ SOLN
INTRAMUSCULAR | Status: DC | PRN
  Administered 2014-07-29 (×2): 1 mg via INTRAVENOUS

## 2014-07-29 MED ORDER — LACTATED RINGERS IV SOLN
INTRAVENOUS | Status: DC | PRN
  Administered 2014-07-29: 09:00:00 via INTRAVENOUS

## 2014-07-29 MED ORDER — BUTAMBEN-TETRACAINE-BENZOCAINE 2-2-14 % EX AERO
INHALATION_SPRAY | CUTANEOUS | Status: DC | PRN
  Administered 2014-07-29: 2 via TOPICAL

## 2014-07-29 MED ORDER — SODIUM CHLORIDE 0.9 % IV SOLN: INTRAVENOUS | Status: DC

## 2014-07-29 NOTE — H&P (Signed)
  Date of Initial H&P: 07/20/14  History reviewed, patient examined, no change in status, stable for surgery.

## 2014-07-29 NOTE — Discharge Instructions (Signed)
Esophagogastroduodenoscopy °Care After °Refer to this sheet in the next few weeks. These instructions provide you with information on caring for yourself after your procedure. Your caregiver may also give you more specific instructions. Your treatment has been planned according to current medical practices, but problems sometimes occur. Call your caregiver if you have any problems or questions after your procedure.  °HOME CARE INSTRUCTIONS °· Do not eat or drink anything until the numbing medicine (local anesthetic) has worn off and your gag reflex has returned. You will know that the local anesthetic has worn off when you can swallow comfortably. °· Do not drive for 12 hours after the procedure or as directed by your caregiver. °· Only take medicines as directed by your caregiver. °SEEK MEDICAL CARE IF:  °· You cannot stop coughing. °· You are not urinating at all or less than usual. °SEEK IMMEDIATE MEDICAL CARE IF: °· You have difficulty swallowing. °· You cannot eat or drink. °· You have worsening throat or chest pain. °· You have dizziness, lightheadedness, or you faint. °· You have nausea or vomiting. °· You have chills. °· You have a fever. °· You have severe abdominal pain. °· You have black, tarry, or bloody stools. °Document Released: 06/05/2012 Document Reviewed: 06/05/2012 °ExitCare® Patient Information ©2015 [employer]. This information is not intended to replace advice given to you by your health care provider. Make sure you discuss any questions you have with your health care provider. °Colonoscopy, Care After °These instructions give you information on caring for yourself after your procedure. Your doctor may also give you more specific instructions. Call your doctor if you have any problems or questions after your procedure. °HOME CARE °· Do not drive for 24 hours. °· Do not sign important papers or use machinery for 24 hours. °· You may shower. °· You may go back to your usual activities, but go  slower for the first 24 hours. °· Take rest breaks often during the first 24 hours. °· Walk around or use warm packs on your belly (abdomen) if you have belly cramping or gas. °· Drink enough fluids to keep your pee (urine) clear or pale yellow. °· Resume your normal diet. Avoid heavy or fried foods. °· Avoid drinking alcohol for 24 hours or as told by your doctor. °· Only take medicines as told by your doctor. °If a tissue sample (biopsy) was taken during the procedure:  °· Do not take aspirin or blood thinners for 7 days, or as told by your doctor. °· Do not drink alcohol for 7 days, or as told by your doctor. °· Eat soft foods for the first 24 hours. °GET HELP IF: °You still have a small amount of blood in your poop (stool) 2-3 days after the procedure. °GET HELP RIGHT AWAY IF: °· You have more than a small amount of blood in your poop. °· You see clumps of tissue (blood clots) in your poop. °· Your belly is puffy (swollen). °· You feel sick to your stomach (nauseous) or throw up (vomit). °· You have a fever. °· You have belly pain that gets worse and medicine does not help. °MAKE SURE YOU: °· Understand these instructions. °· Will watch your condition. °· Will get help right away if you are not doing well or get worse. °Document Released: 07/22/2010 Document Revised: 06/24/2013 Document Reviewed: 02/24/2013 °ExitCare® Patient Information ©2015 [employer]. This information is not intended to replace advice given to you by your health care provider. Make sure you   discuss any questions you have with your health care provider. ° °

## 2014-07-29 NOTE — Anesthesia Postprocedure Evaluation (Signed)
  Anesthesia Post-op Note  Patient: Casey Brennan  Procedure(s) Performed: Procedure(s): ESOPHAGOGASTRODUODENOSCOPY (EGD) WITH PROPOFOL (N/A) COLONOSCOPY WITH PROPOFOL (N/A)  Patient Location: PACU  Anesthesia Type:MAC  Level of Consciousness: awake and alert   Airway and Oxygen Therapy: Patient Spontanous Breathing  Post-op Pain: none  Post-op Assessment: Post-op Vital signs reviewed  Post-op Vital Signs: Reviewed  Last Vitals:  Filed Vitals:   07/29/14 1010  BP: 151/88  Pulse: 84  Temp:   Resp: 20    Complications: No apparent anesthesia complications

## 2014-07-29 NOTE — Op Note (Signed)
[facility] [place], [id number]   COLONOSCOPY PROCEDURE REPORT     EXAM DATE: Aug 25, 2014  PATIENT NAME:      Casey Brennan, Casey Brennan           MR #:      [id number]  BIRTHDATE:       Oct 06, 1960      VISIT #:     [id number]  ATTENDING:     Wilford Corner, MD     STATUS:     outpatient REFERRING MD: ASA CLASS:        Class III  INDICATIONS:  The patient is a 54 yr old male here for a colonoscopy due to abdominal pain in the upper right quadrant. PROCEDURE PERFORMED:     Colonoscopy, diagnostic MEDICATIONS:     Monitored anesthesia care and Per Anesthesia  ESTIMATED BLOOD LOSS:     None  CONSENT: The patient understands the risks and benefits of the procedure and understands that these risks include, but are not limited to: sedation, allergic reaction, infection, perforation and/or bleeding. Alternative means of evaluation and treatment include, among others: physical exam, x-rays, and/or surgical intervention. The patient elects to proceed with this endoscopic procedure.  DESCRIPTION OF PROCEDURE: During intra-op preparation period all mechanical & medical equipment was checked for proper function. Hand hygiene and appropriate measures for infection prevention was taken. After the risks, benefits and alternatives of the procedure were thoroughly explained, Informed consent was verified, confirmed and timeout was successfully executed by the treatment team. A digital exam revealed no abnormalities of the rectum.      The Pentax Ped Colon [id number] endoscope was introduced through the anus and advanced to the cecum, which was identified by both the appendix and ileocecal valve. The prep was fair.. The instrument was then slowly withdrawn as the colon was fully examined. Medium-sized internal hemorrhoids noted on retroflexion and colonoscopy was otherwise normal.          The scope was then completely withdrawn from the patient and the  procedure terminated.      ADVERSE EVENTS:      There were no immediate complications.   IMPRESSIONS:     Medium-sized internal hemorrhoids  RECOMMENDATIONS:     Repeat colon in 10 years   Wilford Corner, MD eSigned:  Wilford Corner, MD 08-25-2014 10:03 AM   cc:  CPT CODES: ICD CODES:  The ICD and CPT codes recommended by this software are interpretations from the data that the clinical staff has captured with the software.  The verification of the translation of this report to the ICD and CPT codes and modifiers is the sole responsibility of the health care institution and practicing physician where this report was generated.  [employer] [place]. will not be held responsible for the validity of the ICD and CPT codes included on this report.  AMA assumes no liability for data contained or not contained herein. CPT is a Designer, television/film set of the [employer].

## 2014-07-29 NOTE — Interval H&P Note (Signed)
History and Physical Interval Note:  07/29/2014 9:14 AM  Casey Brennan  has presented today for surgery, with the diagnosis of ruq abd. pain/acid reflux  The various methods of treatment have been discussed with the patient and family. After consideration of risks, benefits and other options for treatment, the patient has consented to  Procedure(s): ESOPHAGOGASTRODUODENOSCOPY (EGD) WITH PROPOFOL (N/A) COLONOSCOPY WITH PROPOFOL (N/A) as a surgical intervention .  The patient's history has been reviewed, patient examined, no change in status, stable for surgery.  I have reviewed the patient's chart and labs.  Questions were answered to the patient's satisfaction.     [name]

## 2014-07-29 NOTE — Anesthesia Preprocedure Evaluation (Addendum)
Anesthesia Evaluation  Patient identified by MRN, date of birth, ID band Patient awake    Reviewed: Allergy & Precautions, NPO status , Patient's Chart, lab work & pertinent test results  Airway Mallampati: I  TM Distance: >3 FB     Dental  (+) Teeth Intact, Dental Advisory Given   Pulmonary asthma , pneumonia -, resolved, COPD COPD inhaler, former smoker,  breath sounds clear to auscultation        Cardiovascular     Neuro/Psych  Headaches,    GI/Hepatic Neg liver ROS, GERD-  Medicated and Controlled,  Endo/Other  Morbid obesity  Renal/GU negative Renal ROS     Musculoskeletal  (+) Arthritis -,   Abdominal (+)  Abdomen: soft. Bowel sounds: normal.  Peds  Hematology  (+) anemia , +HIV   Anesthesia Other Findings   Reproductive/Obstetrics                            Anesthesia Physical Anesthesia Plan  ASA: III  Anesthesia Plan: MAC   Post-op Pain Management:    Induction: Intravenous  Airway Management Planned: Natural Airway, Nasal Cannula and Simple Face Mask  Additional Equipment:   Intra-op Plan:   Post-operative Plan:   Informed Consent: I have reviewed the patients History and Physical, chart, labs and discussed the procedure including the risks, benefits and alternatives for the proposed anesthesia with the patient or authorized representative who has indicated his/her understanding and acceptance.     Plan Discussed with: CRNA  Anesthesia Plan Comments:         Anesthesia Quick Evaluation

## 2014-07-29 NOTE — Transfer of Care (Signed)
Immediate Anesthesia Transfer of Care Note  Patient: Casey Brennan  Procedure(s) Performed: Procedure(s): ESOPHAGOGASTRODUODENOSCOPY (EGD) WITH PROPOFOL (N/A) COLONOSCOPY WITH PROPOFOL (N/A)  Patient Location: Endoscopy Unit  Anesthesia Type:MAC  Level of Consciousness: awake, alert  and sedated  Airway & Oxygen Therapy: Patient connected to nasal cannula oxygen  Post-op Assessment: Post -op Vital signs reviewed and stable  Post vital signs: stable  Complications: No apparent anesthesia complications

## 2014-07-29 NOTE — Op Note (Signed)
[facility] [place], [address]   ENDOSCOPY PROCEDURE REPORT  PATIENT: Casey Brennan, Casey Brennan  MR#: [id number] BIRTHDATE: 05/15/1961 , 53  yrs. old GENDER: male ENDOSCOPIST: Wilford Corner, MD REFERRED BY: PROCEDURE DATE:  19-Aug-2014 PROCEDURE:  EGD, diagnostic ASA CLASS:     Class III INDICATIONS:  follow up of GERD and abdominal pain in the upper right quadrant. MEDICATIONS: Monitored anesthesia care and Per Anesthesia TOPICAL ANESTHETIC: Cetacaine Spray  DESCRIPTION OF PROCEDURE: After the risks benefits and alternatives of the procedure were thoroughly explained, informed consent was obtained.  The Pentax Gastroscope [id number] endoscope was introduced through the mouth and advanced to the second portion of the duodenum , Without limitations.  The instrument was slowly withdrawn as the mucosa was fully examined.    Esophagus and GEJ normal. Minimal focal erythema in antrum consistent with minimal gastritis. Small hiatal hernia noted on retroflexion. Duodenal bulb and 2nd part of the duodenum normal in appearance.               The scope was then withdrawn from the patient and the procedure completed.  COMPLICATIONS: There were no immediate complications.  ENDOSCOPIC IMPRESSION:     Minimal antral gastritis Small hiatal hernia  RECOMMENDATIONS:     Proceed with colonoscopy   eSigned:  Wilford Corner, MD Aug 19, 2014 9:56 AM    CC:  CPT CODES: ICD CODES:  The ICD and CPT codes recommended by this software are interpretations from the data that the clinical staff has captured with the software.  The verification of the translation of this report to the ICD and CPT codes and modifiers is the sole responsibility of the health care institution and practicing physician where this report was generated.  [employer] will not be held responsible for the validity of the ICD and CPT codes included on this report.  AMA  assumes no liability for data contained or not contained herein. CPT is a Designer, television/film set of the [employer].

## 2014-07-30 ENCOUNTER — Encounter (HOSPITAL_COMMUNITY): Payer: Self-pay | Admitting: Gastroenterology

## 2014-08-04 ENCOUNTER — Other Ambulatory Visit (HOSPITAL_COMMUNITY): Payer: Self-pay | Admitting: Gastroenterology

## 2014-08-04 DIAGNOSIS — R1011 Right upper quadrant pain: Secondary | ICD-10-CM

## 2014-08-18 ENCOUNTER — Ambulatory Visit (HOSPITAL_COMMUNITY): Payer: Medicare Other

## 2014-08-19 ENCOUNTER — Ambulatory Visit: Payer: Medicare Other

## 2014-08-19 DIAGNOSIS — F4310 Post-traumatic stress disorder, unspecified: Secondary | ICD-10-CM
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
## 2014-08-21 ENCOUNTER — Other Ambulatory Visit (HOSPITAL_COMMUNITY): Payer: Self-pay | Admitting: Gastroenterology

## 2014-08-21 ENCOUNTER — Encounter (HOSPITAL_COMMUNITY)
Admission: RE | Admit: 2014-08-21 | Discharge: 2014-08-21 | Disposition: A | Discharge status: Home / Self Care | Payer: Medicare Other | Source: Ambulatory Visit | Attending: Gastroenterology | Admitting: Gastroenterology

## 2014-08-21 DIAGNOSIS — K8020 Calculus of gallbladder without cholecystitis without obstruction: Secondary | ICD-10-CM | POA: Diagnosis not present

## 2014-08-21 DIAGNOSIS — R1011 Right upper quadrant pain: Secondary | ICD-10-CM | POA: Diagnosis not present

## 2014-08-21 DIAGNOSIS — R140 Abdominal distension (gaseous): Secondary | ICD-10-CM | POA: Diagnosis not present

## 2014-08-21 DIAGNOSIS — R110 Nausea: Secondary | ICD-10-CM | POA: Diagnosis not present

## 2014-08-21 MED ORDER — TECHNETIUM TC 99M MEBROFENIN IV KIT: 5.0000 | PACK | Freq: Once | INTRAVENOUS | Status: AC | PRN

## 2014-08-27 ENCOUNTER — Telehealth: Payer: Self-pay | Admitting: Licensed Clinical Social Worker

## 2014-08-27 NOTE — Telephone Encounter (Signed)
Patient called wanting Dr. [name] to call his lawyer Vivi Barrack at [phone] about his current case. Patient states that Dr. [name] is aware of what's going on, so he did not give further information.  Patient states that he has questions for the provider.

## 2014-09-01 ENCOUNTER — Other Ambulatory Visit (HOSPITAL_COMMUNITY): Payer: Self-pay | Admitting: Gastroenterology

## 2014-09-01 DIAGNOSIS — R1011 Right upper quadrant pain: Secondary | ICD-10-CM

## 2014-09-02 ENCOUNTER — Encounter: Payer: Self-pay | Admitting: Nurse Practitioner

## 2014-09-02 ENCOUNTER — Ambulatory Visit (INDEPENDENT_AMBULATORY_CARE_PROVIDER_SITE_OTHER): Payer: Medicare Other | Admitting: Nurse Practitioner

## 2014-09-02 VITALS — BP 131/79 | HR 88 | Ht 69.0 in | Wt 213.8 lb

## 2014-09-02 DIAGNOSIS — G44229 Chronic tension-type headache, not intractable: Secondary | ICD-10-CM | POA: Diagnosis not present

## 2014-09-02 DIAGNOSIS — G609 Hereditary and idiopathic neuropathy, unspecified: Secondary | ICD-10-CM | POA: Diagnosis not present

## 2014-09-02 MED ORDER — GABAPENTIN 300 MG PO CAPS: 300.0000 mg | ORAL_CAPSULE | Freq: Four times a day (QID) | ORAL | Status: DC | PRN

## 2014-09-02 MED ORDER — TIZANIDINE HCL 6 MG PO CAPS: 6.0000 mg | ORAL_CAPSULE | Freq: Three times a day (TID) | ORAL | Status: DC | PRN

## 2014-09-02 NOTE — Patient Instructions (Signed)
Take gabapentin as directed this will help your neuropathy and your headaches Zanaflex when necessary, this will help your neck stiffness Follow-up yearly and when necessary
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
## 2014-09-02 NOTE — Progress Notes (Signed)
I agree with the assessment and plan as directed by NP .The patient is known to me .   Jp Eastham, MD  

## 2014-09-03 ENCOUNTER — Ambulatory Visit: Payer: Medicare Other | Admitting: Nurse Practitioner

## 2014-09-11 ENCOUNTER — Ambulatory Visit (HOSPITAL_COMMUNITY)
Admission: RE | Admit: 2014-09-11 | Discharge: 2014-09-11 | Disposition: A | Discharge status: Home / Self Care | Payer: Medicare Other | Source: Ambulatory Visit | Attending: Gastroenterology | Admitting: Gastroenterology

## 2014-09-11 DIAGNOSIS — R140 Abdominal distension (gaseous): Secondary | ICD-10-CM | POA: Diagnosis not present

## 2014-09-11 DIAGNOSIS — R110 Nausea: Secondary | ICD-10-CM | POA: Diagnosis not present

## 2014-09-11 DIAGNOSIS — R1011 Right upper quadrant pain: Secondary | ICD-10-CM | POA: Insufficient documentation

## 2014-09-11 DIAGNOSIS — R109 Unspecified abdominal pain: Secondary | ICD-10-CM | POA: Diagnosis not present

## 2014-09-11 MED ORDER — TECHNETIUM TC 99M MEBROFENIN IV KIT
5.0000 | PACK | Freq: Once | INTRAVENOUS | Status: AC | PRN
  Administered 2014-09-11: 5 via INTRAVENOUS

## 2014-09-16 ENCOUNTER — Other Ambulatory Visit: Payer: Self-pay | Admitting: Infectious Diseases

## 2014-09-20 ENCOUNTER — Emergency Department (HOSPITAL_COMMUNITY)
Admission: EM | Admit: 2014-09-20 | Discharge: 2014-09-20 | Disposition: A | Discharge status: Home / Self Care | Payer: Medicare Other | Attending: Emergency Medicine | Admitting: Emergency Medicine

## 2014-09-20 ENCOUNTER — Encounter (HOSPITAL_COMMUNITY): Payer: Self-pay | Admitting: *Deleted

## 2014-09-20 DIAGNOSIS — Z87891 Personal history of nicotine dependence: Secondary | ICD-10-CM | POA: Insufficient documentation

## 2014-09-20 DIAGNOSIS — Y9289 Other specified places as the place of occurrence of the external cause: Secondary | ICD-10-CM | POA: Diagnosis not present

## 2014-09-20 DIAGNOSIS — Z8546 Personal history of malignant neoplasm of prostate: Secondary | ICD-10-CM | POA: Insufficient documentation

## 2014-09-20 DIAGNOSIS — Z872 Personal history of diseases of the skin and subcutaneous tissue: Secondary | ICD-10-CM | POA: Insufficient documentation

## 2014-09-20 DIAGNOSIS — Z7982 Long term (current) use of aspirin: Secondary | ICD-10-CM | POA: Diagnosis not present

## 2014-09-20 DIAGNOSIS — Z7951 Long term (current) use of inhaled steroids: Secondary | ICD-10-CM | POA: Insufficient documentation

## 2014-09-20 DIAGNOSIS — Z87820 Personal history of traumatic brain injury: Secondary | ICD-10-CM | POA: Diagnosis not present

## 2014-09-20 DIAGNOSIS — Y9389 Activity, other specified: Secondary | ICD-10-CM | POA: Diagnosis not present

## 2014-09-20 DIAGNOSIS — Z862 Personal history of diseases of the blood and blood-forming organs and certain disorders involving the immune mechanism: Secondary | ICD-10-CM | POA: Insufficient documentation

## 2014-09-20 DIAGNOSIS — Z973 Presence of spectacles and contact lenses: Secondary | ICD-10-CM | POA: Insufficient documentation

## 2014-09-20 DIAGNOSIS — Z79899 Other long term (current) drug therapy: Secondary | ICD-10-CM | POA: Insufficient documentation

## 2014-09-20 DIAGNOSIS — Z8614 Personal history of Methicillin resistant Staphylococcus aureus infection: Secondary | ICD-10-CM | POA: Diagnosis not present

## 2014-09-20 DIAGNOSIS — K219 Gastro-esophageal reflux disease without esophagitis: Secondary | ICD-10-CM | POA: Insufficient documentation

## 2014-09-20 DIAGNOSIS — R0602 Shortness of breath: Secondary | ICD-10-CM | POA: Insufficient documentation

## 2014-09-20 DIAGNOSIS — T7840XA Allergy, unspecified, initial encounter: Secondary | ICD-10-CM | POA: Insufficient documentation

## 2014-09-20 DIAGNOSIS — Z21 Asymptomatic human immunodeficiency virus [HIV] infection status: Secondary | ICD-10-CM | POA: Diagnosis not present

## 2014-09-20 DIAGNOSIS — R110 Nausea: Secondary | ICD-10-CM | POA: Diagnosis not present

## 2014-09-20 DIAGNOSIS — J45909 Unspecified asthma, uncomplicated: Secondary | ICD-10-CM | POA: Diagnosis not present

## 2014-09-20 DIAGNOSIS — Z880 Allergy status to penicillin: Secondary | ICD-10-CM | POA: Insufficient documentation

## 2014-09-20 DIAGNOSIS — Z8701 Personal history of pneumonia (recurrent): Secondary | ICD-10-CM | POA: Diagnosis not present

## 2014-09-20 DIAGNOSIS — X58XXXA Exposure to other specified factors, initial encounter: Secondary | ICD-10-CM | POA: Diagnosis not present

## 2014-09-20 DIAGNOSIS — M1990 Unspecified osteoarthritis, unspecified site: Secondary | ICD-10-CM | POA: Diagnosis not present

## 2014-09-20 DIAGNOSIS — Y998 Other external cause status: Secondary | ICD-10-CM | POA: Insufficient documentation

## 2014-09-20 MED ORDER — FAMOTIDINE IN NACL 20-0.9 MG/50ML-% IV SOLN
20.0000 mg | Freq: Once | INTRAVENOUS | Status: AC
  Administered 2014-09-20: 20 mg via INTRAVENOUS
  Filled 2014-09-20: qty 50

## 2014-09-20 MED ORDER — KETOROLAC TROMETHAMINE 30 MG/ML IJ SOLN
30.0000 mg | Freq: Once | INTRAMUSCULAR | Status: AC
  Administered 2014-09-20: 30 mg via INTRAVENOUS
  Filled 2014-09-20: qty 1

## 2014-09-20 MED ORDER — HYDROXYZINE HCL 25 MG PO TABS: 25.0000 mg | ORAL_TABLET | Freq: Four times a day (QID) | ORAL | Status: DC

## 2014-09-20 MED ORDER — METHYLPREDNISOLONE SODIUM SUCC 125 MG IJ SOLR
125.0000 mg | Freq: Once | INTRAMUSCULAR | Status: AC
  Administered 2014-09-20: 125 mg via INTRAVENOUS
  Filled 2014-09-20: qty 2

## 2014-09-20 MED ORDER — PREDNISONE 20 MG PO TABS: 60.0000 mg | ORAL_TABLET | Freq: Every day | ORAL | Status: DC

## 2014-09-20 MED ORDER — DIPHENHYDRAMINE HCL 50 MG/ML IJ SOLN
12.5000 mg | Freq: Once | INTRAMUSCULAR | Status: AC
  Administered 2014-09-20: 12.5 mg via INTRAVENOUS
  Filled 2014-09-20: qty 1

## 2014-09-20 MED ORDER — ONDANSETRON HCL 4 MG/2ML IJ SOLN
4.0000 mg | Freq: Once | INTRAMUSCULAR | Status: AC
  Administered 2014-09-20: 4 mg via INTRAVENOUS
  Filled 2014-09-20: qty 2

## 2014-09-20 MED ORDER — ALBUTEROL SULFATE (2.5 MG/3ML) 0.083% IN NEBU
5.0000 mg | INHALATION_SOLUTION | Freq: Four times a day (QID) | RESPIRATORY_TRACT | Status: DC | PRN
Start: 2014-09-20 — End: 2014-11-11

## 2014-09-20 NOTE — ED Notes (Signed)
Pt. Left with all belongings and refused wheelchair 

## 2014-09-20 NOTE — Discharge Instructions (Signed)
Hives Hives are itchy, red, swollen areas of the skin. They can vary in size and location on your body. Hives can come and go for hours or several days (acute hives) or for several weeks (chronic hives). Hives do not spread from person to person (noncontagious). They may get worse with scratching, exercise, and emotional stress. CAUSES   Allergic reaction to food, additives, or drugs.  Infections, including the common cold.  Illness, such as vasculitis, lupus, or thyroid disease.  Exposure to sunlight, heat, or cold.  Exercise.  Stress.  Contact with chemicals. SYMPTOMS   Red or white swollen patches on the skin. The patches may change size, shape, and location quickly and repeatedly.  Itching.  Swelling of the hands, feet, and face. This may occur if hives develop deeper in the skin. DIAGNOSIS  Your caregiver can usually tell what is wrong by performing a physical exam. Skin or blood tests may also be done to determine the cause of your hives. In some cases, the cause cannot be determined. TREATMENT  Mild cases usually get better with medicines such as antihistamines. Severe cases may require an emergency epinephrine injection. If the cause of your hives is known, treatment includes avoiding that trigger.  HOME CARE INSTRUCTIONS   Avoid causes that trigger your hives.  Take antihistamines as directed by your caregiver to reduce the severity of your hives. Non-sedating or low-sedating antihistamines are usually recommended. Do not drive while taking an antihistamine.  Take any other medicines prescribed for itching as directed by your caregiver.  Wear loose-fitting clothing.  Keep all follow-up appointments as directed by your caregiver. SEEK MEDICAL CARE IF:   You have persistent or severe itching that is not relieved with medicine.  You have painful or swollen joints. SEEK IMMEDIATE MEDICAL CARE IF:   You have a fever.  Your tongue or lips are swollen.  You have  trouble breathing or swallowing.  You feel tightness in the throat or chest.  You have abdominal pain. These problems may be the first sign of a life-threatening allergic reaction. Call your local emergency services (911 in [place] MAKE SURE YOU:   Understand these instructions.  Will watch your condition.  Will get help right away if you are not doing well or get worse. Document Released: 06/19/2005 Document Revised: 06/24/2013 Document Reviewed: 09/12/2011 Exit[employer] Patient Information 2015 [employer]. This information is not intended to replace advice given to you by your health care provider. Make sure you discuss any questions you have with your health care provider.  

## 2014-09-20 NOTE — ED Notes (Signed)
The pt is having an allerguic reactiuon to some type med for approx 2 hours .  Rash over her body especially his legs and arms.  He took 50 mg of benadryl approx 0100.  No difficulty breathing at presen\t.  He feels sob

## 2014-09-20 NOTE — ED Provider Notes (Signed)
CSN: [id number]     Arrival date & time 09/20/14  0300 History   First MD Initiated Contact with Patient 09/20/14 0315     Chief Complaint  Patient presents with  . Allergic Reaction     (Consider location/radiation/quality/duration/timing/severity/associated sxs/prior Treatment) HPI Comments: Earlier tonight he broke out into hives on his back and generalized itching. He had mild SOB and felt as if he was wheezing. His sister gave him Benadryl which helped the rash but the itching continued. He has had allergic reactions in the past, currently no new medications, foods, or contact with known allergens. He complains of nausea but there has been no vomiting.   Patient is a 54 y.o. male presenting with allergic reaction. The history is provided by the patient. No language interpreter was used.  Allergic Reaction Presenting symptoms: itching and rash   Presenting symptoms: no difficulty swallowing   Severity:  Moderate   Past Medical History  Diagnosis Date  . HIV (human immunodeficiency virus infection) 1988  . Chronic anemia     normocytic  . Chronic folliculitis   . Gross hematuria   . Elevated PSA   . H/O pericarditis     2010--  myopercarditis--  resolved  . History of concussion     2012  &  2013  RESIDUAL HA'S --  RESOLVED  . Axillary lymphadenopathy   . History of MRSA infection 2010    infected boil  . History of gastric ulcer   . Lytic bone lesion of hip     WORK-UP DONE BY ONCOLOGIST DR HA --  NOT MALIGNANT  . Wears glasses   . Headache(784.0)     HX SEVERE FRONTAL HA'S  . Prostate cancer 04/25/13    gleason 3+3=6, volume 30 gm  . Ulcer     hx of gastric  . Asthma     very rare  . Arthritis     r knee   . Pneumonia     hx of  . Post concussion syndrome     resolved  . GERD (gastroesophageal reflux disease)    Past Surgical History  Procedure Laterality Date  . Upper leg soft tissue biopsy  2012    left thigh  . Knee arthroscopy Right 1985  .  Colonoscopy  12/26/2011    Procedure: COLONOSCOPY;  Surgeon: Lear Ng, MD;  Location: [facility];  Service: Endoscopy;  Laterality: N/A;  . Esophagogastroduodenoscopy  12/26/2011    Procedure: ESOPHAGOGASTRODUODENOSCOPY (EGD);  Surgeon: Lear Ng, MD;  Location: [facility];  Service: Endoscopy;  Laterality: N/A;  . Cardiac catheterization  03-23-2009  DR COOPER    NORMAL CORONARY ARTERIES  . Excisional bx left breast mass/  i  &  d left breast abscess  03-24-2009  . Excision chronic left breast abscess  09-21-2010  . Cystoscopy/retrograde/ureteroscopy Bilateral 04/25/2013    Procedure: CYSTOSCOPY/ BILATERAL RETROGRADES; BLADDER BIOPSIES;  Surgeon: Alexis Frock, MD;  Location: [facility];  Service: Urology;  Laterality: Bilateral;  . Prostate biopsy N/A 04/25/2013    Procedure: BIOPSY TRANSRECTAL ULTRASONIC PROSTATE (TUBP);  Surgeon: Alexis Frock, MD;  Location: [facility];  Service: Urology;  Laterality: N/A;  . Left hip biopsy  10/2012  . Left axilla biopsy  04/2013  . Robot assisted laparoscopic radical prostatectomy N/A 08/18/2013    Procedure: ROBOTIC ASSISTED LAPAROSCOPIC RADICAL PROSTATECTOMY;  Surgeon: Alexis Frock, MD;  Location: [facility];  Service: Urology;  Laterality: N/A;  . Lymphadenectomy Bilateral 08/18/2013  Procedure: LYMPHADENECTOMY "PELVIC LYMPH NODE DISSECTION";  Surgeon: Alexis Frock, MD;  Location: [facility];  Service: Urology;  Laterality: Bilateral;  . Esophagogastroduodenoscopy (egd) with propofol N/A 07/29/2014    Procedure: ESOPHAGOGASTRODUODENOSCOPY (EGD) WITH PROPOFOL;  Surgeon: Lear Ng, MD;  Location: [place];  Service: Endoscopy;  Laterality: N/A;  . Colonoscopy with propofol N/A 07/29/2014    Procedure: COLONOSCOPY WITH PROPOFOL;  Surgeon: Lear Ng, MD;  Location: [place];  Service: Endoscopy;  Laterality: N/A;   Family History  Problem Relation Age of Onset  . Stroke  Father   . Diabetes Father   . Cancer Father     brain cancer  . Hypertension Sister   . Cancer Maternal Uncle     prostate cancer  . Asthma Sister   . Asthma Father    History  Substance Use Topics  . Smoking status: Former Smoker -- 1.50 packs/day for 23 years    Types: Cigars, Cigarettes    Quit date: 05/22/2010  . Smokeless tobacco: Never Used  . Alcohol Use: No    Review of Systems  Constitutional: Negative for fever and chills.  HENT: Negative.  Negative for trouble swallowing.   Respiratory: Positive for shortness of breath.   Cardiovascular: Negative.   Gastrointestinal: Negative.   Musculoskeletal: Negative.   Skin: Positive for itching and rash.  Neurological: Negative.       Allergies  Morphine and related; Penicillins; Sulfonamide derivatives; and Tramadol  Home Medications   Prior to Admission medications   Medication Sig Start Date End Date Taking? Authorizing Provider  aspirin EC 81 MG tablet Take 81 mg by mouth 3 (three) times a week. Monday, Tuesday and Wednesday   Yes Historical Provider, MD  Beclomethasone Dipropionate (QNASL) 80 MCG/ACT AERS Place 1 spray into the nose 2 (two) times daily. 07/22/14  Yes Tanda Rockers, MD  famotidine (PEPCID) 20 MG tablet Take 1 tablet (20 mg total) by mouth 2 (two) times daily. 07/22/14  Yes Tanda Rockers, MD  gabapentin (NEURONTIN) 300 MG capsule Take 1 capsule (300 mg total) by mouth 4 (four) times daily as needed (nerve pain). 09/02/14  Yes Otilio Jefferson, NP  ipratropium (ATROVENT) 0.02 % nebulizer solution Take 2.5 mLs (0.5 mg total) by nebulization 3 (three) times daily as needed for wheezing or shortness of breath. Mix with xopenex 02/13/14  Yes Carly Montey Hora, MD  levalbuterol (XOPENEX) 0.63 MG/3ML nebulizer solution Take 3 mLs (0.63 mg total) by nebulization 3 (three) times daily as needed for wheezing or shortness of breath. Mix with ipratropium 02/13/14  Yes Carly J Rivet, MD  loratadine (CLARITIN) 10 MG tablet  Take 10 mg by mouth at bedtime.   Yes Historical Provider, MD  mometasone-formoterol (DULERA) 200-5 MCG/ACT AERO Take 2 puffs first thing in am and then another 2 puffs about 12 hours later. 07/22/14  Yes Tanda Rockers, MD  Multiple Vitamin (MULTIVITAMIN WITH MINERALS) TABS tablet Take 1 tablet by mouth at bedtime.   Yes Historical Provider, MD  mupirocin ointment (BACTROBAN) 2 % APPLY TO THE AFFECTED AREA TWICE DAILY FOR 2 WEEKS. PLACE COTTON BETWEEN TOES TO ALLOW BETTER AERATION 06/23/14  Yes Campbell Riches, MD  NORVIR 100 MG TABS tablet TAKE 1 TABLET BY MOUTH EVERY DAY WITH PREZISTA AND TRUVADA 07/27/14  Yes Campbell Riches, MD  omeprazole (PRILOSEC) 40 MG capsule Take 40 mg by mouth at bedtime.   Yes Historical Provider, MD  ondansetron (ZOFRAN) 4 MG tablet TAKE 1  TABLET BY MOUTH EVERY 8 HOURS AS NEEDED FOR FOR NAUSEA AND VOMITING 09/16/14  Yes Campbell Riches, MD  Polyvinyl Alcohol-Povidone (REFRESH OP) Place 1 drop into both eyes at bedtime.   Yes Historical Provider, MD  PREZISTA 800 MG tablet TAKE 1 TABLET BY MOUTH EVERY DAY 07/27/14  Yes Campbell Riches, MD  raltegravir (ISENTRESS) 400 MG tablet Take 800 mg by mouth at bedtime.   Yes Historical Provider, MD  tizanidine (ZANAFLEX) 6 MG capsule Take 1 capsule (6 mg total) by mouth 3 (three) times daily as needed for muscle spasms. 09/02/14  Yes Otilio Jefferson, NP  traZODone (DESYREL) 50 MG tablet Take 50 mg by mouth at bedtime as needed for sleep.   Yes Historical Provider, MD  TRUVADA 200-300 MG per tablet TAKE 1 TABLET BY MOUTH EVERY DAY WITH PREZISTA AND NORVIR 07/27/14  Yes Campbell Riches, MD  VOLTAREN 1 % GEL APPLY TO THE AFFECTED AREA TWICE DAILY AS NEEDED Patient taking differently: APPLY TO THE AFFECTED AREA TWICE DAILY AS NEEDED FOR PAIN. 06/23/14  Yes Campbell Riches, MD  zolpidem (AMBIEN CR) 12.5 MG CR tablet Take 1 tablet (12.5 mg total) by mouth at bedtime as needed for sleep. 02/13/14 02/13/15 Yes Carly J Rivet, MD   BP  140/92 mmHg  Pulse 92  Temp(Src) 98.2 F (36.8 C) (Oral)  Resp 18  Ht 5\' 9"  (1.753 m)  Wt 205 lb (92.987 kg)  BMI 30.26 kg/m2  SpO2 93% Physical Exam  Constitutional: He is oriented to person, place, and time. He appears well-developed and well-nourished. No distress.  HENT:  Head: Normocephalic.  Nose: Mucosal edema present. Right sinus exhibits maxillary sinus tenderness and frontal sinus tenderness. Left sinus exhibits maxillary sinus tenderness and frontal sinus tenderness.  Mouth/Throat: Oropharynx is clear and moist.  Neck: Normal range of motion. Neck supple.  Cardiovascular: Normal rate and regular rhythm.   Pulmonary/Chest: Effort normal and breath sounds normal. He has no wheezes. He has no rales.  Abdominal: Soft. Bowel sounds are normal. There is no tenderness. There is no rebound and no guarding.  Musculoskeletal: Normal range of motion. He exhibits no edema.  Neurological: He is alert and oriented to person, place, and time.  Skin: Skin is warm and dry. No rash noted. He is not diaphoretic.  Psychiatric: He has a normal mood and affect.    ED Course  Procedures (including critical care time) Labs Review Labs Reviewed - No data to display  Imaging Review No results found.   EKG Interpretation None      MDM   Final diagnoses:  None    1. Allergic reaction 2. Nausea  He feels improved with medications. VSS. He has persistent nausea without vomiting, complained of dizziness as well. Better with IV zofran and patient states he is ready for discharge home.     Charlann Lange, PA-C 09/20/14 [id number]  Linton Flemings, MD 09/20/14 [phone]

## 2014-10-12 ENCOUNTER — Other Ambulatory Visit: Payer: Medicare Other

## 2014-10-19 ENCOUNTER — Other Ambulatory Visit: Payer: Self-pay | Admitting: Infectious Diseases

## 2014-10-19 ENCOUNTER — Other Ambulatory Visit: Payer: Self-pay | Admitting: Internal Medicine

## 2014-10-19 DIAGNOSIS — R110 Nausea: Secondary | ICD-10-CM

## 2014-10-20 DIAGNOSIS — Y289XXA Contact with unspecified sharp object, undetermined intent, initial encounter: Secondary | ICD-10-CM | POA: Diagnosis not present

## 2014-10-20 DIAGNOSIS — N5201 Erectile dysfunction due to arterial insufficiency: Secondary | ICD-10-CM | POA: Diagnosis not present

## 2014-10-20 DIAGNOSIS — C61 Malignant neoplasm of prostate: Secondary | ICD-10-CM | POA: Diagnosis not present

## 2014-10-20 DIAGNOSIS — R310 Gross hematuria: Secondary | ICD-10-CM | POA: Diagnosis not present

## 2014-10-20 DIAGNOSIS — N393 Stress incontinence (female) (male): Secondary | ICD-10-CM | POA: Diagnosis not present

## 2014-10-23 ENCOUNTER — Ambulatory Visit (HOSPITAL_COMMUNITY): Payer: Medicare Other

## 2014-10-23 ENCOUNTER — Encounter: Payer: Self-pay | Admitting: Internal Medicine

## 2014-10-23 ENCOUNTER — Ambulatory Visit (HOSPITAL_COMMUNITY)
Admission: RE | Admit: 2014-10-23 | Discharge: 2014-10-23 | Disposition: A | Discharge status: Home / Self Care | Payer: Medicare Other | Source: Ambulatory Visit | Attending: Internal Medicine | Admitting: Internal Medicine

## 2014-10-23 ENCOUNTER — Ambulatory Visit: Payer: Medicare Other | Admitting: Internal Medicine

## 2014-10-23 ENCOUNTER — Ambulatory Visit (INDEPENDENT_AMBULATORY_CARE_PROVIDER_SITE_OTHER): Payer: Medicare Other | Admitting: Internal Medicine

## 2014-10-23 VITALS — BP 138/89 | HR 85 | Temp 98.0°F | Ht 69.0 in | Wt 212.2 lb

## 2014-10-23 DIAGNOSIS — J309 Allergic rhinitis, unspecified: Secondary | ICD-10-CM

## 2014-10-23 DIAGNOSIS — Z21 Asymptomatic human immunodeficiency virus [HIV] infection status: Secondary | ICD-10-CM

## 2014-10-23 DIAGNOSIS — K219 Gastro-esophageal reflux disease without esophagitis: Secondary | ICD-10-CM

## 2014-10-23 DIAGNOSIS — K8080 Other cholelithiasis without obstruction: Secondary | ICD-10-CM

## 2014-10-23 DIAGNOSIS — R079 Chest pain, unspecified: Secondary | ICD-10-CM | POA: Insufficient documentation

## 2014-10-23 DIAGNOSIS — R0789 Other chest pain: Secondary | ICD-10-CM

## 2014-10-23 DIAGNOSIS — I10 Essential (primary) hypertension: Secondary | ICD-10-CM

## 2014-10-23 DIAGNOSIS — J301 Allergic rhinitis due to pollen: Secondary | ICD-10-CM

## 2014-10-23 DIAGNOSIS — K8020 Calculus of gallbladder without cholecystitis without obstruction: Secondary | ICD-10-CM

## 2014-10-23 DIAGNOSIS — B20 Human immunodeficiency virus [HIV] disease: Secondary | ICD-10-CM

## 2014-10-23 NOTE — Patient Instructions (Signed)
It was a pleasure seeing you today, Casey Brennan.  - We will do EKG today for pre-op clearance - Please make your appointment with Dr. Hulen Skains to pursue your gallbladder surgery - Try Flonase for your allergies   General Instructions:   Please bring your medicines with you each time you come to clinic.  Medicines may include prescription medications, over-the-counter medications, herbal remedies, eye drops, vitamins, or other pills.   Progress Toward Treatment Goals:  No flowsheet data found.  Self Care Goals & Plans:  Self Care Goal 10/23/2014  Manage my medications take my medicines as prescribed; bring my medications to every visit; refill my medications on time  Eat healthy foods eat more vegetables; eat foods that are low in salt; eat baked foods instead of fried foods    No flowsheet data found.   Care Management & Community Referrals:  No flowsheet data found.

## 2014-10-24 DIAGNOSIS — J309 Allergic rhinitis, unspecified: Secondary | ICD-10-CM | POA: Insufficient documentation

## 2014-10-24 NOTE — Assessment & Plan Note (Signed)
Patient follows with Dr. [name]. Well controlled with last CD4 count 490 and VL < 20 on 04/14/14. He is currently on Norvir, Prezista, Isentress, and Truvada.  - Continue current regimen - Has appt with Dr. [name] in June

## 2014-10-24 NOTE — Progress Notes (Signed)
   Subjective:    Patient ID: Casey Brennan, male    DOB: 03/19/1961, 54 y.o.   MRN: [id number]  HPI Casey Brennan is a 54yo man with PMHx of HTN, HIV, GERD, asthma, ?COPD, and prostate cancer that is in remission who presents today for the following:  HTN: BP 138/89 today. Patient is not on any home BP medications.   GERD: Patient denies any reflux symptoms. Well controlled on Prilosec 40 mg daily.   Biliary Colic: Patient reports RUQ pain for the past few months. He states he is scheduled to see Dr. Hulen Skains next week and is to have gallbladder surgery soon. He has an abdominal US in Dec 2015 which showed cholelithiasis and two HIDA scan (Feb and March 2016) with the most recent one showing findings consistent with chronic cholecysyitis. He denies any fever, chills, nausea, or vomiting.   Atypical Chest Pain: Patient reports infrequent chest pain. He was hospitalized in August 2015 for chest pain and noted to have ST elevations in II, V3, and V5 and negative troponins. His repeat EKG during that hospitalization showed improvement of the ST elevations in leads V3 and V5, but persistence in lead II. Patient states he has had 3-4 episodes of chest pain since January this year. He describes the chest pain as located in the middle of his chest, sharp, lasting 30 minutes or less, and non-radiating. The episodes have not been associated with dyspnea, diaphoresis, N/V. He denies chest pain with exertion and dyspnea when climbing stairs or ambulating. He states the chest pain will sometimes occur at rest, but can happen at any time. Denies association with food.   Allergic Rhinitis: Patient reports his allergies have worsened since the weather has been warming up and with increased pollen in the air. He is currently taking Claritin daily and Qnasal spray. He has tried Environmental manager with no relief. He describes associated sinus pressure and tenderness, puffy eyes, and headaches. He denies ear pain,  rhinorrhea, sore throat.    Review of Systems General: Denies night sweats, changes in weight, changes in appetite HEENT: Denies ear pain, changes in vision CV: See above  Pulm: Denies cough, wheezing GI: Denies diarrhea, constipation, melena, hematochezia GU: Denies dysuria, hematuria, frequency Msk: Denies muscle cramps, joint pains Neuro: Denies weakness, numbness, tingling Skin: Denies rashes, bruising    Objective:   Physical Exam General: sitting up in chair, pleasant, NAD HEENT: [place]/AT, EOMI, ear canals and tympanic membranes appear normal bilaterally, pharynx non-erythematous, no sinus tenderness present, mucus membranes moist Neck: supple, no JVD appreciated CV: RRR, normal S1/S2, no m/g/r Pulm: CTA bilaterally, breaths non-labored Abd: BS+, soft, mild tenderness to palpation in the RUQ and epigastrium Ext: warm, no edema, moves all Neuro: alert and oriented x 3, no focal deficits     Assessment & Plan:  Please refer to A&P documentation.

## 2014-10-24 NOTE — Assessment & Plan Note (Signed)
Well controlled.  Continue Protonix 40 mg daily.

## 2014-10-24 NOTE — Assessment & Plan Note (Signed)
Patient with RUQ abdominal pain, Abd [place] with cholelithiasis, and HIDA scan showing chronic cholecystitis. Patient to follow up with Dr. Hulen Skains next week. An EKG was done in office today which did not show any evidence of ischemia and no changes from previous EKG. Cholecystectomy is a low risk procedure and patient does not require any further cardiac work up prior to surgery. Labs (CBC, bmet) should be drawn prior to surgery.  - f/u Dr. Richarda Blade recommendations, patient likely to have cholecystectomy in near future

## 2014-10-24 NOTE — Assessment & Plan Note (Signed)
Patient's chest pain is atypical as it has been intermittent, sharp, non-radiating, and does not occur with exertion. He has no prior CAD. He was noted to have mild ST elevations on EKG during his August 2015 admission, but looking back through prior EKGs this has been present for many years. Troponins were also negative during that admission. Given the patient's atypical symptoms I do not believe that he needs further work up at this time. The patient's caregiver that accompanied him today was adamant about a cardiology referral and having a repeat echo (last one in 2010 which showed a normal EF 50-55%), but I explained that the patient's chest pain is not indicative of cardiac ischemia at this time and that if his chest pain becomes more frequent then we can consider a cardiology referral. I also explained that an echocardiogram would not be the best test to see if he is having cardiac ischemia or blockage in his coronary arteries. He has no evidence of heart failure on exam and an echo is not appropriate at this time. An EKG was performed today since the patient will be undergoing cholecystectomy likely in the near future for his acute cholecystitis. His repeat EKG showed mild ST elevations in leads II and III which is old. No other evidence of ischemia. Given a cholecystectomy is a low risk procedure he does not require any further work up prior to surgery.  - Continue to monitor - Can consider cardiology referral if symptoms worsen

## 2014-10-24 NOTE — Assessment & Plan Note (Signed)
BP Readings from Last 3 Encounters:  10/23/14 138/89  09/20/14 133/87  09/02/14 131/79    Lab Results  Component Value Date   NA 142 07/29/2014   K 3.6 07/29/2014   CREATININE 1.20 07/29/2014    Assessment: Blood pressure control: controlled Progress toward BP goal:  at goal Comments: BP controlled.   Plan: Medications:  No medications at this time  Educational resources provided: brochure Other plans:  - Continue to monitor - Encouraged low sodium diet and weight loss - Can start ARB (has had issues with chronic cough in past) or thiazide if BP becomes elevated

## 2014-10-24 NOTE — Assessment & Plan Note (Signed)
Patient with seasonal allergies due to pollen. Patient reports symptoms are not controlled, but he has no sinus tenderness or rhinorrhea on exam. Recommended to try Flonase again but patient prefers Qnasal instead.  - Continue current regimen of Claritin and Qnasal spray - Continue to monitor

## 2014-10-25 NOTE — Progress Notes (Signed)
Internal Medicine Clinic Attending  Case discussed with Dr. Arcelia Jew at the time of the visit.  We reviewed the resident's history and exam and pertinent patient test results.  I agree with the assessment, diagnosis, and plan of care documented in the resident's note. We reviewed the algorithm for pre-op clearance, and per expert rec's, the pt does not require further cardiac W/U prior to elective, low risk, lap surgery.

## 2014-10-26 ENCOUNTER — Ambulatory Visit: Payer: Medicare Other | Admitting: Infectious Diseases

## 2014-10-27 ENCOUNTER — Ambulatory Visit: Payer: Self-pay | Admitting: General Surgery

## 2014-10-27 DIAGNOSIS — K8010 Calculus of gallbladder with chronic cholecystitis without obstruction: Secondary | ICD-10-CM | POA: Diagnosis not present

## 2014-11-05 ENCOUNTER — Encounter (HOSPITAL_COMMUNITY): Payer: Self-pay

## 2014-11-05 ENCOUNTER — Ambulatory Visit (HOSPITAL_COMMUNITY)
Admission: RE | Admit: 2014-11-05 | Discharge: 2014-11-05 | Disposition: A | Discharge status: Home / Self Care | Payer: Medicare Other | Source: Ambulatory Visit | Attending: General Surgery | Admitting: General Surgery

## 2014-11-05 ENCOUNTER — Encounter (HOSPITAL_COMMUNITY)
Admission: RE | Admit: 2014-11-05 | Discharge: 2014-11-05 | Disposition: A | Discharge status: Home / Self Care | Payer: Medicare Other | Source: Ambulatory Visit | Attending: General Surgery | Admitting: General Surgery

## 2014-11-05 DIAGNOSIS — Z792 Long term (current) use of antibiotics: Secondary | ICD-10-CM | POA: Diagnosis not present

## 2014-11-05 DIAGNOSIS — Z791 Long term (current) use of non-steroidal anti-inflammatories (NSAID): Secondary | ICD-10-CM | POA: Diagnosis not present

## 2014-11-05 DIAGNOSIS — Z8546 Personal history of malignant neoplasm of prostate: Secondary | ICD-10-CM | POA: Diagnosis not present

## 2014-11-05 DIAGNOSIS — Z86711 Personal history of pulmonary embolism: Secondary | ICD-10-CM | POA: Diagnosis not present

## 2014-11-05 DIAGNOSIS — Z7951 Long term (current) use of inhaled steroids: Secondary | ICD-10-CM | POA: Diagnosis not present

## 2014-11-05 DIAGNOSIS — Z01818 Encounter for other preprocedural examination: Secondary | ICD-10-CM | POA: Diagnosis not present

## 2014-11-05 DIAGNOSIS — Z79899 Other long term (current) drug therapy: Secondary | ICD-10-CM | POA: Diagnosis not present

## 2014-11-05 DIAGNOSIS — K8010 Calculus of gallbladder with chronic cholecystitis without obstruction: Secondary | ICD-10-CM | POA: Diagnosis not present

## 2014-11-05 DIAGNOSIS — I10 Essential (primary) hypertension: Secondary | ICD-10-CM | POA: Diagnosis not present

## 2014-11-05 DIAGNOSIS — J449 Chronic obstructive pulmonary disease, unspecified: Secondary | ICD-10-CM | POA: Diagnosis not present

## 2014-11-05 DIAGNOSIS — Z7982 Long term (current) use of aspirin: Secondary | ICD-10-CM | POA: Diagnosis not present

## 2014-11-05 DIAGNOSIS — Z21 Asymptomatic human immunodeficiency virus [HIV] infection status: Secondary | ICD-10-CM | POA: Diagnosis not present

## 2014-11-05 DIAGNOSIS — Z87891 Personal history of nicotine dependence: Secondary | ICD-10-CM | POA: Diagnosis not present

## 2014-11-05 DIAGNOSIS — J45909 Unspecified asthma, uncomplicated: Secondary | ICD-10-CM | POA: Diagnosis not present

## 2014-11-05 DIAGNOSIS — K219 Gastro-esophageal reflux disease without esophagitis: Secondary | ICD-10-CM | POA: Diagnosis not present

## 2014-11-05 DIAGNOSIS — M1990 Unspecified osteoarthritis, unspecified site: Secondary | ICD-10-CM | POA: Diagnosis not present

## 2014-11-05 HISTORY — DX: Other allergic rhinitis: J30.89

## 2014-11-05 HISTORY — DX: Personal history of urinary calculi: Z87.442

## 2014-11-05 HISTORY — DX: Bronchitis, not specified as acute or chronic: J40

## 2014-11-05 LAB — CBC WITH DIFFERENTIAL/PLATELET
Basophils Absolute: 0.1 10*3/uL (ref 0.0–0.1)
Basophils Relative: 1 % (ref 0–1)
Eosinophils Absolute: 0.3 10*3/uL (ref 0.0–0.7)
Eosinophils Relative: 5 % (ref 0–5)
HCT: 29.7 % — ABNORMAL LOW (ref 39.0–52.0)
Hemoglobin: 8.7 g/dL — ABNORMAL LOW (ref 13.0–17.0)
Lymphocytes Relative: 34 % (ref 12–46)
Lymphs Abs: 1.9 10*3/uL (ref 0.7–4.0)
MCH: 21.8 pg — ABNORMAL LOW (ref 26.0–34.0)
MCHC: 29.3 g/dL — ABNORMAL LOW (ref 30.0–36.0)
MCV: 74.4 fL — ABNORMAL LOW (ref 78.0–100.0)
Monocytes Absolute: 0.6 10*3/uL (ref 0.1–1.0)
Monocytes Relative: 10 % (ref 3–12)
Neutro Abs: 2.7 10*3/uL (ref 1.7–7.7)
Neutrophils Relative %: 50 % (ref 43–77)
Platelets: 221 10*3/uL (ref 150–400)
RBC: 3.99 MIL/uL — ABNORMAL LOW (ref 4.22–5.81)
RDW: 25.1 % — ABNORMAL HIGH (ref 11.5–15.5)
WBC: 5.6 10*3/uL (ref 4.0–10.5)

## 2014-11-05 LAB — COMPREHENSIVE METABOLIC PANEL
ALT: 13 U/L — ABNORMAL LOW (ref 17–63)
AST: 21 U/L (ref 15–41)
Albumin: 4 g/dL (ref 3.5–5.0)
Alkaline Phosphatase: 162 U/L — ABNORMAL HIGH (ref 38–126)
Anion gap: 10 (ref 5–15)
BUN: 9 mg/dL (ref 6–20)
CO2: 22 mmol/L (ref 22–32)
Calcium: 9.1 mg/dL (ref 8.9–10.3)
Chloride: 105 mmol/L (ref 101–111)
Creatinine, Ser: 1.43 mg/dL — ABNORMAL HIGH (ref 0.61–1.24)
GFR calc Af Amer: >60 mL/min (ref >60)
GFR calc non Af Amer: 55 mL/min — ABNORMAL LOW (ref >60)
Glucose, Bld: 101 mg/dL — ABNORMAL HIGH (ref 70–99)
Potassium: 4.2 mmol/L (ref 3.5–5.1)
Sodium: 137 mmol/L (ref 135–145)
Total Bilirubin: 0.9 mg/dL (ref 0.3–1.2)
Total Protein: 7.3 g/dL (ref 6.5–8.1)

## 2014-11-05 MED ORDER — CIPROFLOXACIN IN D5W 400 MG/200ML IV SOLN
400.0000 mg | INTRAVENOUS | Status: AC
  Administered 2014-11-06: 400 mg via INTRAVENOUS
  Filled 2014-11-05: qty 200

## 2014-11-05 MED ORDER — CHLORHEXIDINE GLUCONATE 4 % EX LIQD
1.0000 "application " | CUTANEOUS | Status: DC
  Filled 2014-11-05: qty 15

## 2014-11-05 NOTE — Progress Notes (Signed)
PCP is [facility] representative. Patient informed Nurse that he had a cardiac cath in 2010. Cath results found in EPIC on 03/24/09. Patient denied having any acute cardiac or pulmonary issues. Patient denied having a sleep study. Sleep apnea results sent to PCP. Patients sister April at chair side during PAT visit.

## 2014-11-05 NOTE — Progress Notes (Signed)
   11/05/14 1319  OBSTRUCTIVE SLEEP APNEA  Have you ever been diagnosed with sleep apnea through a sleep study? No  Do you snore loudly (loud enough to be heard through closed doors)?  1  Do you often feel tired, fatigued, or sleepy during the daytime? 1  Has anyone observed you stop breathing during your sleep? 0  Do you have, or are you being treated for high blood pressure? 0  BMI more than 35 kg/m2? 0  Age over 52 years old? 1  Neck circumference greater than 40 cm/16 inches? 1  Gender: 1  Obstructive Sleep Apnea Score 5   This patient has screened at risk for sleep apnea using the STOP Bang tool used during a pre-surgical visit. A score of 4 or greater is at risk for sleep apnea.

## 2014-11-05 NOTE — Pre-Procedure Instructions (Signed)
Casey Brennan  11/05/2014   Your procedure is scheduled on:  Friday Nov 06, 2014 at 7:30 AM.  Report to [facility] Admitting at 5:30 AM.  Call this number if you have problems the morning of surgery: [phone]   Remember:   Do not eat food or drink liquids after midnight.   Take these medicines the morning of surgery with A SIP OF WATER: Albuterol nebulizer if needed, Nasal spray, Gabapentin (Neurontin) if needed, Ipratropium (Atrovent) nebulizer if needed, Dulera inhaler, Ondansetron (Zofran) if needed, and Loratadine (Claritin)   Please stop taking any vitamins, herbal medications, Ibuprofen, Advil, Motrin, Alleve, etc.   Do not wear jewelry.  Do not wear lotions, powders, or cologne.   Men may shave face and neck.  Do not bring valuables to the hospital.  [facility] is not responsible for any belongings or valuables.               Contacts, dentures or bridgework may not be worn into surgery.  Leave suitcase in the car. After surgery it may be brought to your room.  For patients admitted to the hospital, discharge time is determined by your treatment team.               Patients discharged the day of surgery will not be allowed to drive home.  Name and phone number of your driver:   Special Instructions: Shower using CHG soap the night before and the morning of your surgery   Please read over the following fact sheets that you were given: Pain Booklet, Coughing and Deep Breathing and Surgical Site Infection Prevention

## 2014-11-05 NOTE — Progress Notes (Signed)
Nurse called patient to see if he was coming to his scheduled 1200 PAT appointment. Patient stated he was waiting on his ride and he did not know how long it would be before he got here.

## 2014-11-05 NOTE — Progress Notes (Signed)
Anesthesia Chart Review:  Patient is a 54 year old male scheduled for laparoscopic cholecystectomy on 11/06/14 by Dr. Hulen Skains.  History includes HIV '88, former smoker, COPD Gold III, chronic anemia, pericarditis, chronic folliculitis, MRSA '10, post-concussion syndrome, asthma, GERD, prostate cancer s/p prostatectomy, nephrolithiasis. PCP is Dr. Albin Felling with [facility]. ID is Dr. [name]. Pulmonologist is Dr. Melvyn Novas. GI is Dr. Michail Sermon.   He was seen on 10/25/14 by Dr. Arcelia Jew for routine follow-up for HTN, GERD, biliary colic, atypical (non-exertional) chest pain. She knew that patient would require future cholecystectomy. No further work-up or cardiac testing was recommended prior to this surgery.    Meds include albuterol, ASA, QNASL, gabapentin, hydroxyzine, Atrovent, Claritin, Norvir, omeprazole, Zofran, Prezista, Raltegravir, Zanaflex, trazodone, Truvada, Ambien.  10/23/14 EKG: NSR.  03/23/09 Cardiac cath (Dr. Sherren Mocha): Normal coronaries. Normal LVF, EF 60%.  03/22/09 Echo: Study Conclusions:Left ventricle: The cavity size was normal. Systolic function was normal. The estimated ejection fraction was in the range of 50% to 55%. Wall motion was normal; there were no regional wall motion abnormalities.    11/05/14 CXR: No active cardiopulmonary disease.   - 07/22/14 PFTs: FVC 1.90 (47%), FEV1 1.12 (34%), FEV1/FVC 59% (74%), FEF25-75% 0.60 (16%). Severe airway obstruction, with low vital capacity.  - 07/22/2014 Walked RA 2 laps @ 185 ft each stopped due to Too tired to complete, no sob or tachypnea/ no desats at nl pace     07/29/14 colonoscopy showed internal hemorrhoids. EGD showed minimal antral gastritis, small hiatal hernia.  Preoperative labs noted. Cr 1.43, glucose 101, AST 21, ALT 13. H/H 8.7/29.7.  Differential showed marked polychromasia and target cells. (Previous ISTAT8 results on 07/29/14 showed a H/H of 11.6/34.0, but all other H/H in Epic have been more consistent with  today's findings showing HGB range 8.3-9.8 and HCT range of 28.0-32.4 since 08/11/13.)  I discussed above with anesthesiologist Dr. Linna Caprice.  Patient with chronic anemia and recent EGD/colonoscopy that did not show mass or evidence of GI bleed. Patient will need a T&S, but if asymptomatic from his anemia can likely proceed as planned with continued out-patient follow-up with his PCP. (I routed his CBC results with message to Dr. Arcelia Jew regarding out-patient follow-up.).  Results also called to [facility] Triage nurse for review of H/H results with Dr. Hulen Skains.  George Hugh [facility]/Anesthesiology Phone [phone] 11/05/2014 4:16 PM

## 2014-11-06 ENCOUNTER — Encounter (HOSPITAL_COMMUNITY): Payer: Self-pay | Admitting: Critical Care Medicine

## 2014-11-06 ENCOUNTER — Other Ambulatory Visit: Payer: Self-pay | Admitting: Internal Medicine

## 2014-11-06 ENCOUNTER — Ambulatory Visit (HOSPITAL_COMMUNITY)
Admission: RE | Admit: 2014-11-06 | Discharge: 2014-11-06 | Disposition: A | Discharge status: Home / Self Care | Payer: Medicare Other | Source: Ambulatory Visit | Attending: General Surgery | Admitting: General Surgery

## 2014-11-06 ENCOUNTER — Ambulatory Visit (HOSPITAL_COMMUNITY): Payer: Medicare Other | Admitting: Critical Care Medicine

## 2014-11-06 ENCOUNTER — Ambulatory Visit (HOSPITAL_COMMUNITY): Payer: Medicare Other | Admitting: Vascular Surgery

## 2014-11-06 ENCOUNTER — Encounter (HOSPITAL_COMMUNITY)
Admission: RE | Disposition: A | Discharge status: Home / Self Care | Payer: Self-pay | Source: Ambulatory Visit | Attending: General Surgery

## 2014-11-06 ENCOUNTER — Ambulatory Visit (HOSPITAL_COMMUNITY): Payer: Medicare Other

## 2014-11-06 DIAGNOSIS — J449 Chronic obstructive pulmonary disease, unspecified: Secondary | ICD-10-CM | POA: Diagnosis not present

## 2014-11-06 DIAGNOSIS — M1990 Unspecified osteoarthritis, unspecified site: Secondary | ICD-10-CM | POA: Diagnosis not present

## 2014-11-06 DIAGNOSIS — J45909 Unspecified asthma, uncomplicated: Secondary | ICD-10-CM | POA: Diagnosis not present

## 2014-11-06 DIAGNOSIS — Z86711 Personal history of pulmonary embolism: Secondary | ICD-10-CM | POA: Insufficient documentation

## 2014-11-06 DIAGNOSIS — Z792 Long term (current) use of antibiotics: Secondary | ICD-10-CM | POA: Insufficient documentation

## 2014-11-06 DIAGNOSIS — K8010 Calculus of gallbladder with chronic cholecystitis without obstruction: Secondary | ICD-10-CM | POA: Insufficient documentation

## 2014-11-06 DIAGNOSIS — K219 Gastro-esophageal reflux disease without esophagitis: Secondary | ICD-10-CM | POA: Insufficient documentation

## 2014-11-06 DIAGNOSIS — Z7951 Long term (current) use of inhaled steroids: Secondary | ICD-10-CM | POA: Insufficient documentation

## 2014-11-06 DIAGNOSIS — Z8546 Personal history of malignant neoplasm of prostate: Secondary | ICD-10-CM | POA: Insufficient documentation

## 2014-11-06 DIAGNOSIS — I10 Essential (primary) hypertension: Secondary | ICD-10-CM | POA: Insufficient documentation

## 2014-11-06 DIAGNOSIS — Z7982 Long term (current) use of aspirin: Secondary | ICD-10-CM | POA: Insufficient documentation

## 2014-11-06 DIAGNOSIS — Z87891 Personal history of nicotine dependence: Secondary | ICD-10-CM | POA: Insufficient documentation

## 2014-11-06 DIAGNOSIS — Z79899 Other long term (current) drug therapy: Secondary | ICD-10-CM | POA: Insufficient documentation

## 2014-11-06 DIAGNOSIS — Z419 Encounter for procedure for purposes other than remedying health state, unspecified: Secondary | ICD-10-CM

## 2014-11-06 DIAGNOSIS — Z21 Asymptomatic human immunodeficiency virus [HIV] infection status: Secondary | ICD-10-CM | POA: Insufficient documentation

## 2014-11-06 DIAGNOSIS — Z791 Long term (current) use of non-steroidal anti-inflammatories (NSAID): Secondary | ICD-10-CM | POA: Insufficient documentation

## 2014-11-06 HISTORY — PX: CHOLECYSTECTOMY: SHX55

## 2014-11-06 LAB — TYPE AND SCREEN
ABO/RH(D): B NEG
Antibody Screen: NEGATIVE

## 2014-11-06 LAB — ABO/RH: ABO/RH(D): B NEG

## 2014-11-06 SURGERY — LAPAROSCOPIC CHOLECYSTECTOMY WITH INTRAOPERATIVE CHOLANGIOGRAM
Anesthesia: General | Site: Abdomen

## 2014-11-06 MED ORDER — GLYCOPYRROLATE 0.2 MG/ML IJ SOLN
INTRAMUSCULAR | Status: DC | PRN
  Administered 2014-11-06: 0.6 mg via INTRAVENOUS

## 2014-11-06 MED ORDER — HYDROCODONE-ACETAMINOPHEN 5-325 MG PO TABS: 1.0000 | ORAL_TABLET | ORAL | Status: DC | PRN

## 2014-11-06 MED ORDER — OXYCODONE HCL 5 MG PO TABS
ORAL_TABLET | ORAL | Status: AC
  Filled 2014-11-06: qty 1

## 2014-11-06 MED ORDER — PROPOFOL 10 MG/ML IV BOLUS
INTRAVENOUS | Status: DC | PRN
  Administered 2014-11-06: 180 mg via INTRAVENOUS

## 2014-11-06 MED ORDER — FENTANYL CITRATE (PF) 250 MCG/5ML IJ SOLN
INTRAMUSCULAR | Status: AC
  Filled 2014-11-06: qty 5

## 2014-11-06 MED ORDER — ROCURONIUM BROMIDE 50 MG/5ML IV SOLN
INTRAVENOUS | Status: AC
  Filled 2014-11-06: qty 1

## 2014-11-06 MED ORDER — BUPIVACAINE-EPINEPHRINE 0.25% -1:200000 IJ SOLN
INTRAMUSCULAR | Status: DC | PRN
  Administered 2014-11-06: 30 mL

## 2014-11-06 MED ORDER — CHLORHEXIDINE GLUCONATE 4 % EX LIQD
1.0000 "application " | Freq: Once | CUTANEOUS | Status: DC
  Filled 2014-11-06: qty 15

## 2014-11-06 MED ORDER — ROCURONIUM BROMIDE 100 MG/10ML IV SOLN
INTRAVENOUS | Status: DC | PRN
  Administered 2014-11-06: 40 mg via INTRAVENOUS

## 2014-11-06 MED ORDER — FENTANYL CITRATE (PF) 100 MCG/2ML IJ SOLN
INTRAMUSCULAR | Status: DC | PRN
  Administered 2014-11-06 (×3): 50 ug via INTRAVENOUS
  Administered 2014-11-06: 100 ug via INTRAVENOUS

## 2014-11-06 MED ORDER — LIDOCAINE HCL (CARDIAC) 20 MG/ML IV SOLN
INTRAVENOUS | Status: DC | PRN
  Administered 2014-11-06: 70 mg via INTRAVENOUS

## 2014-11-06 MED ORDER — 0.9 % SODIUM CHLORIDE (POUR BTL) OPTIME
TOPICAL | Status: DC | PRN
  Administered 2014-11-06: 1000 mL

## 2014-11-06 MED ORDER — ONDANSETRON HCL 4 MG/2ML IJ SOLN: 4.0000 mg | Freq: Four times a day (QID) | INTRAMUSCULAR | Status: DC | PRN

## 2014-11-06 MED ORDER — PROPOFOL 10 MG/ML IV BOLUS
INTRAVENOUS | Status: AC
  Filled 2014-11-06: qty 20

## 2014-11-06 MED ORDER — BUPIVACAINE-EPINEPHRINE (PF) 0.25% -1:200000 IJ SOLN
INTRAMUSCULAR | Status: AC
  Filled 2014-11-06: qty 30

## 2014-11-06 MED ORDER — MIDAZOLAM HCL 2 MG/2ML IJ SOLN
INTRAMUSCULAR | Status: AC
  Filled 2014-11-06: qty 2

## 2014-11-06 MED ORDER — SODIUM CHLORIDE 0.9 % IV SOLN
INTRAVENOUS | Status: DC | PRN
  Administered 2014-11-06: 50 mL

## 2014-11-06 MED ORDER — LACTATED RINGERS IV SOLN
INTRAVENOUS | Status: DC | PRN
  Administered 2014-11-06 (×2): via INTRAVENOUS

## 2014-11-06 MED ORDER — LIDOCAINE HCL (CARDIAC) 20 MG/ML IV SOLN
INTRAVENOUS | Status: AC
  Filled 2014-11-06: qty 5

## 2014-11-06 MED ORDER — EPHEDRINE SULFATE 50 MG/ML IJ SOLN
INTRAMUSCULAR | Status: AC
  Filled 2014-11-06: qty 1

## 2014-11-06 MED ORDER — ONDANSETRON HCL 4 MG/2ML IJ SOLN
INTRAMUSCULAR | Status: DC | PRN
  Administered 2014-11-06: 4 mg via INTRAVENOUS

## 2014-11-06 MED ORDER — SUCCINYLCHOLINE CHLORIDE 20 MG/ML IJ SOLN
INTRAMUSCULAR | Status: AC
  Filled 2014-11-06: qty 1

## 2014-11-06 MED ORDER — SODIUM CHLORIDE 0.9 % IJ SOLN
INTRAMUSCULAR | Status: AC
  Filled 2014-11-06: qty 10

## 2014-11-06 MED ORDER — FENTANYL CITRATE (PF) 100 MCG/2ML IJ SOLN
INTRAMUSCULAR | Status: AC
  Filled 2014-11-06: qty 2

## 2014-11-06 MED ORDER — OXYCODONE HCL 5 MG/5ML PO SOLN: 5.0000 mg | Freq: Once | ORAL | Status: AC | PRN

## 2014-11-06 MED ORDER — NEOSTIGMINE METHYLSULFATE 10 MG/10ML IV SOLN
INTRAVENOUS | Status: DC | PRN
  Administered 2014-11-06: 4 mg via INTRAVENOUS

## 2014-11-06 MED ORDER — FENTANYL CITRATE (PF) 100 MCG/2ML IJ SOLN
25.0000 ug | INTRAMUSCULAR | Status: DC | PRN
  Administered 2014-11-06 (×3): 50 ug via INTRAVENOUS

## 2014-11-06 MED ORDER — DEXAMETHASONE SODIUM PHOSPHATE 10 MG/ML IJ SOLN
INTRAMUSCULAR | Status: DC | PRN
  Administered 2014-11-06: 10 mg via INTRAVENOUS

## 2014-11-06 MED ORDER — MIDAZOLAM HCL 5 MG/5ML IJ SOLN
INTRAMUSCULAR | Status: DC | PRN
  Administered 2014-11-06: 2 mg via INTRAVENOUS

## 2014-11-06 MED ORDER — ARTIFICIAL TEARS OP OINT
TOPICAL_OINTMENT | OPHTHALMIC | Status: AC
  Filled 2014-11-06: qty 3.5

## 2014-11-06 MED ORDER — SODIUM CHLORIDE 0.9 % IR SOLN
Status: DC | PRN
  Administered 2014-11-06: 1000 mL

## 2014-11-06 MED ORDER — OXYCODONE HCL 5 MG PO TABS
5.0000 mg | ORAL_TABLET | Freq: Once | ORAL | Status: AC | PRN
  Administered 2014-11-06: 5 mg via ORAL

## 2014-11-06 SURGICAL SUPPLY — 46 items
ADH SKN CLS APL DERMABOND .7 (GAUZE/BANDAGES/DRESSINGS) ×1
APPLIER CLIP 5 13 M/L LIGAMAX5 (MISCELLANEOUS) ×2
APR CLP MED LRG 5 ANG JAW (MISCELLANEOUS) ×1
BLADE SURG ROTATE 9660 (MISCELLANEOUS) ×1 IMPLANT
CANISTER SUCTION 2500CC (MISCELLANEOUS) ×2 IMPLANT
CHLORAPREP W/TINT 26ML (MISCELLANEOUS) ×2 IMPLANT
CLIP APPLIE 5 13 M/L LIGAMAX5 (MISCELLANEOUS) ×1 IMPLANT
COVER MAYO STAND STRL (DRAPES) ×2 IMPLANT
COVER SURGICAL LIGHT HANDLE (MISCELLANEOUS) ×2 IMPLANT
DERMABOND ADVANCED (GAUZE/BANDAGES/DRESSINGS) ×1
DERMABOND ADVANCED .7 DNX12 (GAUZE/BANDAGES/DRESSINGS) IMPLANT
DRAPE C-ARM 42X72 X-RAY (DRAPES) ×2 IMPLANT
DRAPE LAPAROSCOPIC ABDOMINAL (DRAPES) ×2 IMPLANT
DRSG TEGADERM 2-3/8X2-3/4 SM (GAUZE/BANDAGES/DRESSINGS) ×8 IMPLANT
DRSG TEGADERM 4X4.75 (GAUZE/BANDAGES/DRESSINGS) ×1 IMPLANT
ELECT REM PT RETURN 9FT ADLT (ELECTROSURGICAL) ×2
ELECTRODE REM PT RTRN 9FT ADLT (ELECTROSURGICAL) ×1 IMPLANT
FILTER SMOKE EVAC LAPAROSHD (FILTER) ×1 IMPLANT
GLOVE BIOGEL PI IND STRL 7.0 (GLOVE) IMPLANT
GLOVE BIOGEL PI IND STRL 7.5 (GLOVE) IMPLANT
GLOVE BIOGEL PI IND STRL 8 (GLOVE) ×1 IMPLANT
GLOVE BIOGEL PI INDICATOR 7.0 (GLOVE) ×1
GLOVE BIOGEL PI INDICATOR 7.5 (GLOVE) ×1
GLOVE BIOGEL PI INDICATOR 8 (GLOVE) ×1
GLOVE ECLIPSE 7.5 STRL STRAW (GLOVE) ×3 IMPLANT
GLOVE SURG SS PI 7.0 STRL IVOR (GLOVE) ×2 IMPLANT
GOWN STRL REUS W/ TWL LRG LVL3 (GOWN DISPOSABLE) ×3 IMPLANT
GOWN STRL REUS W/TWL LRG LVL3 (GOWN DISPOSABLE) ×6
KIT BASIN OR (CUSTOM PROCEDURE TRAY) ×2 IMPLANT
KIT ROOM TURNOVER OR (KITS) ×2 IMPLANT
LIQUID BAND (GAUZE/BANDAGES/DRESSINGS) ×2 IMPLANT
NS IRRIG 1000ML POUR BTL (IV SOLUTION) ×2 IMPLANT
PAD ARMBOARD 7.5X6 YLW CONV (MISCELLANEOUS) ×2 IMPLANT
SCISSORS LAP 5X35 DISP (ENDOMECHANICALS) ×2 IMPLANT
SET CHOLANGIOGRAPH 5 50 .035 (SET/KITS/TRAYS/PACK) ×2 IMPLANT
SET IRRIG TUBING LAPAROSCOPIC (IRRIGATION / IRRIGATOR) ×2 IMPLANT
SLEEVE ENDOPATH XCEL 5M (ENDOMECHANICALS) ×4 IMPLANT
SPECIMEN JAR SMALL (MISCELLANEOUS) ×2 IMPLANT
STRIP CLOSURE SKIN 1/2X4 (GAUZE/BANDAGES/DRESSINGS) ×1 IMPLANT
SUT MNCRL AB 4-0 PS2 18 (SUTURE) ×3 IMPLANT
TOWEL OR 17X24 6PK STRL BLUE (TOWEL DISPOSABLE) ×2 IMPLANT
TOWEL OR 17X26 10 PK STRL BLUE (TOWEL DISPOSABLE) ×2 IMPLANT
TRAY LAPAROSCOPIC (CUSTOM PROCEDURE TRAY) ×2 IMPLANT
TROCAR XCEL BLUNT TIP 100MML (ENDOMECHANICALS) ×2 IMPLANT
TROCAR XCEL NON-BLD 5MMX100MML (ENDOMECHANICALS) ×2 IMPLANT
TUBING INSUFFLATION (TUBING) ×2 IMPLANT

## 2014-11-06 NOTE — Discharge Instructions (Signed)
Laparoscopic Cholecystectomy, Care After °Refer to this sheet in the next few weeks. These instructions provide you with information on caring for yourself after your procedure. Your health care provider may also give you more specific instructions. Your treatment has been planned according to current medical practices, but problems sometimes occur. Call your health care provider if you have any problems or questions after your procedure. °WHAT TO EXPECT AFTER THE PROCEDURE °After your procedure, it is typical to have the following: °· Pain at your incision sites. You will be given pain medicines to control the pain. °· Mild nausea or vomiting. This should improve after the first 24 hours. °· Bloating and possibly shoulder pain from the gas used during the procedure. This will improve after the first 24 hours. °HOME CARE INSTRUCTIONS  °· Change bandages (dressings) as directed by your health care provider. °· Keep the wound dry and clean. You may wash the wound gently with soap and water. Gently blot or dab the area dry. °· Do not take baths or use swimming pools or hot tubs for 2 weeks or until your health care provider approves. °· Only take over-the-counter or prescription medicines as directed by your health care provider. °· Continue your normal diet as directed by your health care provider. °· Do not lift anything heavier than 10 pounds (4.5 kg) until your health care provider approves. °· Do not play contact sports for 1 week or until your health care provider approves. °SEEK MEDICAL CARE IF:  °· You have redness, swelling, or increasing pain in the wound. °· You notice yellowish-white fluid (pus) coming from the wound. °· You have drainage from the wound that lasts longer than 1 day. °· You notice a bad smell coming from the wound or dressing. °· Your surgical cuts (incisions) break open. °SEEK IMMEDIATE MEDICAL CARE IF:  °· You develop a rash. °· You have difficulty breathing. °· You have chest pain. °· You  have a fever. °· You have increasing pain in the shoulders (shoulder strap areas). °· You have dizzy episodes or faint while standing. °· You have severe abdominal pain. °· You feel sick to your stomach (nauseous) or throw up (vomit) and this lasts for more than 1 day. °Document Released: 06/19/2005 Document Revised: 04/09/2013 Document Reviewed: 01/29/2013 °ExitCare® Patient Information ©2015 [employer]. This information is not intended to replace advice given to you by your health care provider. Make sure you discuss any questions you have with your health care provider. ° °

## 2014-11-06 NOTE — Interval H&P Note (Signed)
History and Physical Interval Note: Anemia noted.  Patient typed and crossed. 11/06/2014 7:34 AM  Casey Brennan  has presented today for surgery, with the diagnosis of Cholelithiasis and chronic cholecystitis  The various methods of treatment have been discussed with the patient and family. After consideration of risks, benefits and other options for treatment, the patient has consented to  Procedure(s): LAPAROSCOPIC CHOLECYSTECTOMY WITH INTRAOPERATIVE CHOLANGIOGRAM (N/A) as a surgical intervention .  The patient's history has been reviewed, patient examined, no change in status, stable for surgery.  I have reviewed the patient's chart and labs.  Questions were answered to the patient's satisfaction.     Cornel Kc Summerson

## 2014-11-06 NOTE — Transfer of Care (Signed)
Immediate Anesthesia Transfer of Care Note  Patient: Casey Brennan  Procedure(s) Performed: Procedure(s): LAPAROSCOPIC CHOLECYSTECTOMY WITH INTRAOPERATIVE CHOLANGIOGRAM (N/A)  Patient Location: PACU  Anesthesia Type:General  Level of Consciousness: awake, alert  and oriented  Airway & Oxygen Therapy: Patient Spontanous Breathing and Patient connected to nasal cannula oxygen  Post-op Assessment: Report given to RN, Post -op Vital signs reviewed and stable and Patient moving all extremities X 4  Post vital signs: Reviewed and stable  Last Vitals:  Filed Vitals:   11/06/14 0607  BP: 106/72  Pulse: 78  Temp: 36.6 C  Resp: 18  BP 135/83, Hr 83, RR 12, Sats 37% on 2L  Complications: No apparent anesthesia complications

## 2014-11-06 NOTE — Anesthesia Preprocedure Evaluation (Addendum)
Anesthesia Evaluation  Patient identified by MRN, date of birth, ID band Patient awake    Reviewed: Allergy & Precautions, NPO status , Patient's Chart, lab work & pertinent test results  Airway Mallampati: I  TM Distance: >3 FB Neck ROM: Full    Dental  (+) Lower Dentures, Upper Dentures   Pulmonary asthma , COPD COPD inhaler, former smoker,  breath sounds clear to auscultation        Cardiovascular hypertension, Rhythm:regular Rate:Normal     Neuro/Psych  Headaches,  Neuromuscular disease    GI/Hepatic GERD-  Medicated,  Endo/Other    Renal/GU      Musculoskeletal  (+) Arthritis - (H/H 8.7/29.7),   Abdominal   Peds  Hematology  (+) anemia , HIV,   Anesthesia Other Findings   Reproductive/Obstetrics                           Anesthesia Physical Anesthesia Plan  ASA: III  Anesthesia Plan: General   Post-op Pain Management:    Induction: Intravenous  Airway Management Planned: Oral ETT  Additional Equipment:   Intra-op Plan:   Post-operative Plan: Extubation in OR  Informed Consent: I have reviewed the patients History and Physical, chart, labs and discussed the procedure including the risks, benefits and alternatives for the proposed anesthesia with the patient or authorized representative who has indicated his/her understanding and acceptance.   Dental advisory given  Plan Discussed with: Anesthesiologist and Surgeon  Anesthesia Plan Comments:         Anesthesia Quick Evaluation

## 2014-11-06 NOTE — Anesthesia Procedure Notes (Signed)
Procedure Name: Intubation Date/Time: 11/06/2014 7:33 AM Performed by: Merrilyn Puma B Pre-anesthesia Checklist: Patient identified, Timeout performed, Emergency Drugs available, Suction available and Patient being monitored Patient Re-evaluated:Patient Re-evaluated prior to inductionOxygen Delivery Method: Circle system utilized Preoxygenation: Pre-oxygenation with 100% oxygen Intubation Type: IV induction and Cricoid Pressure applied Ventilation: Mask ventilation without difficulty Laryngoscope Size: Mac and 4 Grade View: Grade III Tube type: Oral Tube size: 7.5 mm Number of attempts: 1 Placement Confirmation: CO2 detector,  positive ETCO2,  ETT inserted through vocal cords under direct vision and breath sounds checked- equal and bilateral Secured at: 23 cm Tube secured with: Tape Dental Injury: Teeth and Oropharynx as per pre-operative assessment  Comments: Pt left dentures in place.  He stated they were unable to come out.  Dentures left in place.

## 2014-11-06 NOTE — Op Note (Addendum)
OPERATIVE REPORT  DATE OF OPERATION: 11/06/2014  PATIENT:  Casey Brennan  54 y.o. male  PRE-OPERATIVE DIAGNOSIS:  Cholelithiasis and chronic cholecystitis  POST-OPERATIVE DIAGNOSIS:  Cholelithiasis and chronic cholecystitis  PROCEDURE:  Procedure(s): LAPAROSCOPIC CHOLECYSTECTOMY WITH INTRAOPERATIVE CHOLANGIOGRAM  SURGEON:  Surgeon(s): Judeth Horn, MD  ASSISTANT: None  ANESTHESIA:   general  EBL: <20 ml  BLOOD ADMINISTERED: none  DRAINS: none   SPECIMEN:  Source of Specimen:  Gallbladder and contents  COUNTS CORRECT:  YES  PROCEDURE DETAILS: The patient was taken to the operating room and placed on the table in the supine position.  After an adequate endotracheal anesthetic was administered, the patient was prepped with ChloroPrep, and then draped in the usual manner exposing the entire abdomen laterally, inferiorly and up  to the costal margins.  After a proper timeout was performed including identifying the patient and the procedure to be performed, a Supraumbilical 2.7OJ midline incision was made using a #15 blade.  This was taken down to the fascia which was then incised with a #15 blade.  The edges of the fascia were tented up with Kocher clamps as the preperitoneal space was penetrated with a Kelly clamp into the peritoneum.  Once this was done, a pursestring suture of 0 Vicryl was passed around the fascial opening.  This was subsequently used to secure the [facility] cannula which was passed into the peritoneal cavity.  Once the [facility] cannula was in place, carbon dioxide gas was insufflated into the peritoneal cavity up to a maximal intra-abdominal pressure of 28mm Hg.The laparoscope, with attached camera and light source, was passed into the peritoneal cavity to visualize the direct insertion of two right upper quadrant 49mm cannulas, and a sup-xiphoid 43mm cannula.  Once all cannulas were in place, the dissection was begun.  Two ratcheted graspers were attached to the dome and  infundibulum of the gallbladder and retracted towards the anterior abdominal wall and the right upper quadrant.  Using cautery attached to a dissecting forceps, the peritoneum overlaying the triangle of Chalot and the hepatoduodenal triangle was dissected away exposing the cystic duct and the cystic artery.  The cystic artery was clipped proximally and distally then transected.  A clip was placed on the gallbladder side of the cystic duct, then a cholecystodochotomy made using the laparoscopic scissors.  Through the cholecystodochotomy a Cook catheter was passed to performed a cholangiogram.  The cholangiogram showed good flow into the duodenum, good proximal filling, no intraductal defects, and no dilatation..  Once the cholangiogram was completed, the [facility] catheter was removed, and the distal cystic duct was clipped multiple times then transected between the clips.  The gallbladder was then dissected out of the hepatic bed without event.  It was retrieved from the abdomen (using an EndoCatch bag) without event.  Once the gallbladder was removed, the bed was inspected for hemostasis.  Once excellent hemostasis was obtained all gas and fluids were aspirated from above the liver, then the cannulas were removed.  The supraumbilical incision was closed using the pursestring suture which was in place.  0.25% bupivicaine with epinephrine was injected at all sites.  All 66mm or greater cannula sites were close using a running subcuticular stitch of 4-0 Monocryl.  5.18mm cannula sites were closed with Dermabond only.Steri-Strips and Tagaderm were used to complete the dressings at all sites.  At this point all needle, sponge, and instrument counts were correct.The patient was awakened from anesthesia and taken to the PACU in stable condition.  PATIENT DISPOSITION:  PACU - hemodynamically stable.   Doug Destenie Ingber 5/6/20168:29 AM

## 2014-11-06 NOTE — H&P (Signed)
Casey Brennan 10/27/2014 12:12 PM Location: [facility] Patient #: [id number] DOB: 02-Jun-1961 Divorced / Language: English / Race: Black or African American Male   The patient is a 54 year old male with symptomatic cholelithiasis and positive HIDA scan.    Other Problems Jeralyn Ruths, CMA; 10/27/2014 12:12 PM) Asthma Back Pain Cholelithiasis Gastroesophageal Reflux Disease HIV-positive Lump In Breast Prostate Cancer Pulmonary Embolism / Blood Clot in Legs  Past Surgical History Jeralyn Ruths, [place]; 10/27/2014 12:12 PM) Breast Mass; Local Excision Left. Knee Surgery Right. Oral Surgery Prostate Surgery - Removal TURP  Diagnostic Studies History Jeralyn Ruths, CMA; 10/27/2014 12:12 PM) Colonoscopy within last year  Allergies Jeralyn Ruths, CMA; 10/27/2014 12:16 PM) Morphine Sulfate (Concentrate) *ANALGESICS - OPIOID* Itching, Shortness of breath. Penicillin G Benzathine & Proc *PENICILLINS* Rash, Shortness of breath. TraMADol HCl *ANALGESICS - OPIOID* Itching, Nausea, Vomiting.  Medication History Jeralyn Ruths, CMA; 10/27/2014 12:23 PM) Albuterol Sulfate ((2.5 MG/3ML)0.083% Nebulized Soln, Inhalation TID PRN) Active. Aspirin EC (81MG  Tablet DR, 1 Oral daily) Active. Qnasl (80MCG/ACT Aerosol Soln, Nasal prn) Active. Neurontin (300MG  Capsule, 1 Oral daily) Active. Atarax (25MG  Tablet, 1 Oral daily) Active. Atrovent (0.02% Solution, Inhalation prn) Active. Claritin (10MG  Tablet, Oral prn) Active. Dulera (100-5MCG/ACT Aerosol, Inhalation prn) Active. Multivitamin (1 Oral daily) Active. Bactroban (2% Cream, External daily) Active. (Apply to affected areas of feet.) Norvir (100MG  Tablet, 1 Oral daily) Active. PriLOSEC (40MG  Capsule DR, 1 Oral daily) Active. Zofran (4MG  Tablet, 1-2 Oral every four to six hours prn) Active. Refresh Liquigel (1% Solution, Ophthalmic daily) Active. Prezista (800MG  Tablet, 1 Oral daily)  Active. Isentress (400MG  Tablet, 1 Oral daily) Active. Zanaflex (6MG  Capsule, 1 Oral TID) Active. Desyrel (50MG  Tablet, 1 Oral daily) Active. Voltaren (1% Gel, Transdermal prn) Active. Ambien CR (12.5MG  Tablet ER, 1 Oral qhs prn) Active.  Social History Jeralyn Ruths, CMA; 10/27/2014 12:12 PM) Caffeine use Carbonated beverages, Coffee, Tea. No alcohol use No drug use Tobacco use Former smoker.  Family History Jeralyn Ruths, [place]; 10/27/2014 12:12 PM) Diabetes Mellitus Mother.  Review of Systems ([place]; 10/27/2014 12:12 PM) General Present- Chills, Fatigue, Fever and Night Sweats. Not Present- Appetite Loss, Weight Gain and Weight Loss. Skin Not Present- Change in Wart/Mole, Dryness, Hives, Jaundice, New Lesions, Non-Healing Wounds, Rash and Ulcer. HEENT Present- Seasonal Allergies, Sinus Pain and Wears glasses/contact lenses. Not Present- Earache, Hearing Loss, Hoarseness, Nose Bleed, Oral Ulcers, Ringing in the Ears, Sore Throat, Visual Disturbances and Yellow Eyes. Respiratory Present- Difficulty Breathing and Wheezing. Not Present- Bloody sputum, Chronic Cough and Snoring. Breast Not Present- Breast Mass, Breast Pain, Nipple Discharge and Skin Changes. Cardiovascular Present- Chest Pain, Palpitations, Rapid Heart Rate, Shortness of Breath and Swelling of Extremities. Not Present- Difficulty Breathing Lying Down and Leg Cramps. Gastrointestinal Present- Abdominal Pain, Bloating, Excessive gas, Gets full quickly at meals, Indigestion and Nausea. Not Present- Bloody Stool, Change in Bowel Habits, Chronic diarrhea, Constipation, Difficulty Swallowing, Hemorrhoids, Rectal Pain and Vomiting. Male Genitourinary Present- Frequency and Nocturia. Not Present- Blood in Urine, Change in Urinary Stream, Impotence, Painful Urination, Urgency and Urine Leakage. Musculoskeletal Present- Back Pain, Joint Pain and Joint Stiffness. Not Present- Muscle Pain, Muscle Weakness and Swelling  of Extremities. Neurological Present- Headaches. Not Present- Decreased Memory, Fainting, Numbness, Seizures, Tingling, Tremor, Trouble walking and Weakness. Psychiatric Not Present- Anxiety, Bipolar, Change in Sleep Pattern, Depression, Fearful and Frequent crying. Endocrine Not Present- Cold Intolerance, Excessive Hunger, Hair Changes, Heat Intolerance, Hot flashes and New Diabetes. Hematology Present- HIV. Not Present- Easy  Bruising, Excessive bleeding, Gland problems and Persistent Infections.   Physical Exam Jeneen Rinks O. Hulen Skains MD; 10/27/2014 12:28 PM) Chest and Lung Exam Chest and lung exam reveals -normal excursion with symmetric chest walls, quiet, even and easy respiratory effort with no use of accessory muscles, non-tender and normal tactile fremitus and on auscultation, normal breath sounds, no adventitious sounds and normal vocal resonance.  Cardiovascular Cardiovascular examination reveals -on palpation PMI is normal in location and amplitude, no palpable S3 or S4. Normal cardiac borders. and normal heart sounds, regular rate and rhythm with no murmurs.  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 Palpation/Percussion Tenderness - Epigastrium and Right Upper Quadrant. Auscultation Auscultation of the abdomen reveals - Bowel sounds normal and No Abdominal bruits.    Assessment & Plan Jeneen Rinks O. Encarnacion Scioneaux MD; 10/27/2014 12:29 PM) CHRONIC CHOLECYSTITIS WITH CALCULUS (574.10  K80.10) Impression: Patient has gallstones and two positive HIDA scans. Needs cholecystectomy. Risks and benefits explained.

## 2014-11-06 NOTE — Anesthesia Postprocedure Evaluation (Signed)
Anesthesia Post Note  Patient: Casey Brennan  Procedure(s) Performed: Procedure(s) (LRB): LAPAROSCOPIC CHOLECYSTECTOMY WITH INTRAOPERATIVE CHOLANGIOGRAM (N/A)  Anesthesia type: General  Patient location: PACU  Post pain: Pain level controlled and Adequate analgesia  Post assessment: Post-op Vital signs reviewed, Patient's Cardiovascular Status Stable, Respiratory Function Stable, Patent Airway and Pain level controlled  Last Vitals:  Filed Vitals:   11/06/14 1000  BP: 128/76  Pulse: 67  Temp:   Resp: 11    Post vital signs: Reviewed and stable  Level of consciousness: awake, alert  and oriented  Complications: No apparent anesthesia complications

## 2014-11-09 ENCOUNTER — Encounter (HOSPITAL_COMMUNITY): Payer: Self-pay | Admitting: General Surgery

## 2014-11-11 ENCOUNTER — Emergency Department (HOSPITAL_COMMUNITY): Payer: Medicare Other

## 2014-11-11 ENCOUNTER — Encounter (HOSPITAL_COMMUNITY): Payer: Self-pay | Admitting: *Deleted

## 2014-11-11 ENCOUNTER — Emergency Department (HOSPITAL_COMMUNITY)
Admission: EM | Admit: 2014-11-11 | Discharge: 2014-11-11 | Disposition: A | Discharge status: Home / Self Care | Payer: Medicare Other | Attending: Emergency Medicine | Admitting: Emergency Medicine

## 2014-11-11 ENCOUNTER — Other Ambulatory Visit: Payer: Self-pay

## 2014-11-11 ENCOUNTER — Telehealth: Payer: Self-pay | Admitting: *Deleted

## 2014-11-11 DIAGNOSIS — Z79899 Other long term (current) drug therapy: Secondary | ICD-10-CM | POA: Insufficient documentation

## 2014-11-11 DIAGNOSIS — Z87891 Personal history of nicotine dependence: Secondary | ICD-10-CM | POA: Insufficient documentation

## 2014-11-11 DIAGNOSIS — Z87442 Personal history of urinary calculi: Secondary | ICD-10-CM | POA: Insufficient documentation

## 2014-11-11 DIAGNOSIS — J9811 Atelectasis: Secondary | ICD-10-CM | POA: Diagnosis not present

## 2014-11-11 DIAGNOSIS — B20 Human immunodeficiency virus [HIV] disease: Secondary | ICD-10-CM | POA: Insufficient documentation

## 2014-11-11 DIAGNOSIS — Z862 Personal history of diseases of the blood and blood-forming organs and certain disorders involving the immune mechanism: Secondary | ICD-10-CM | POA: Diagnosis not present

## 2014-11-11 DIAGNOSIS — K219 Gastro-esophageal reflux disease without esophagitis: Secondary | ICD-10-CM | POA: Insufficient documentation

## 2014-11-11 DIAGNOSIS — Z9889 Other specified postprocedural states: Secondary | ICD-10-CM | POA: Diagnosis not present

## 2014-11-11 DIAGNOSIS — R05 Cough: Secondary | ICD-10-CM | POA: Diagnosis not present

## 2014-11-11 DIAGNOSIS — Z7982 Long term (current) use of aspirin: Secondary | ICD-10-CM | POA: Insufficient documentation

## 2014-11-11 DIAGNOSIS — Z8701 Personal history of pneumonia (recurrent): Secondary | ICD-10-CM | POA: Insufficient documentation

## 2014-11-11 DIAGNOSIS — Z880 Allergy status to penicillin: Secondary | ICD-10-CM | POA: Insufficient documentation

## 2014-11-11 DIAGNOSIS — Z792 Long term (current) use of antibiotics: Secondary | ICD-10-CM | POA: Insufficient documentation

## 2014-11-11 DIAGNOSIS — R0602 Shortness of breath: Secondary | ICD-10-CM | POA: Diagnosis not present

## 2014-11-11 DIAGNOSIS — Z872 Personal history of diseases of the skin and subcutaneous tissue: Secondary | ICD-10-CM | POA: Insufficient documentation

## 2014-11-11 DIAGNOSIS — M1711 Unilateral primary osteoarthritis, right knee: Secondary | ICD-10-CM | POA: Diagnosis not present

## 2014-11-11 DIAGNOSIS — J45901 Unspecified asthma with (acute) exacerbation: Secondary | ICD-10-CM | POA: Insufficient documentation

## 2014-11-11 DIAGNOSIS — R079 Chest pain, unspecified: Secondary | ICD-10-CM | POA: Diagnosis not present

## 2014-11-11 DIAGNOSIS — Z8546 Personal history of malignant neoplasm of prostate: Secondary | ICD-10-CM | POA: Insufficient documentation

## 2014-11-11 DIAGNOSIS — Z8614 Personal history of Methicillin resistant Staphylococcus aureus infection: Secondary | ICD-10-CM | POA: Diagnosis not present

## 2014-11-11 DIAGNOSIS — Z8659 Personal history of other mental and behavioral disorders: Secondary | ICD-10-CM | POA: Diagnosis not present

## 2014-11-11 DIAGNOSIS — Z7951 Long term (current) use of inhaled steroids: Secondary | ICD-10-CM | POA: Insufficient documentation

## 2014-11-11 DIAGNOSIS — J209 Acute bronchitis, unspecified: Secondary | ICD-10-CM | POA: Diagnosis not present

## 2014-11-11 LAB — BASIC METABOLIC PANEL
Anion gap: 7 (ref 5–15)
BUN: 8 mg/dL (ref 6–20)
CO2: 26 mmol/L (ref 22–32)
Calcium: 8.9 mg/dL (ref 8.9–10.3)
Chloride: 102 mmol/L (ref 101–111)
Creatinine, Ser: 1.33 mg/dL — ABNORMAL HIGH (ref 0.61–1.24)
GFR calc Af Amer: >60 mL/min (ref >60)
GFR calc non Af Amer: >60 mL/min (ref >60)
Glucose, Bld: 120 mg/dL — ABNORMAL HIGH (ref 70–99)
Potassium: 4 mmol/L (ref 3.5–5.1)
Sodium: 135 mmol/L (ref 135–145)

## 2014-11-11 LAB — I-STAT CG4 LACTIC ACID, ED: Lactic Acid, Venous: 1.58 mmol/L (ref 0.5–2.0)

## 2014-11-11 LAB — I-STAT TROPONIN, ED
Troponin i, poc: 0 ng/mL (ref 0.00–0.08)
Troponin i, poc: 0 ng/mL (ref 0.00–0.08)

## 2014-11-11 LAB — BRAIN NATRIURETIC PEPTIDE: B Natriuretic Peptide: 7.2 pg/mL (ref 0.0–100.0)

## 2014-11-11 LAB — CBC
HCT: 33.9 % — ABNORMAL LOW (ref 39.0–52.0)
Hemoglobin: 10 g/dL — ABNORMAL LOW (ref 13.0–17.0)
MCH: 21.7 pg — ABNORMAL LOW (ref 26.0–34.0)
MCHC: 29.5 g/dL — ABNORMAL LOW (ref 30.0–36.0)
MCV: 73.7 fL — ABNORMAL LOW (ref 78.0–100.0)
Platelets: 192 10*3/uL (ref 150–400)
RBC: 4.6 MIL/uL (ref 4.22–5.81)
RDW: 23 % — ABNORMAL HIGH (ref 11.5–15.5)
WBC: 6.2 10*3/uL (ref 4.0–10.5)

## 2014-11-11 LAB — D-DIMER, QUANTITATIVE: D-Dimer, Quant: 1.28 ug/mL-FEU — ABNORMAL HIGH (ref 0.00–0.48)

## 2014-11-11 MED ORDER — OXYCODONE-ACETAMINOPHEN 5-325 MG PO TABS: 1.0000 | ORAL_TABLET | Freq: Four times a day (QID) | ORAL | Status: DC | PRN

## 2014-11-11 MED ORDER — ALBUTEROL SULFATE (2.5 MG/3ML) 0.083% IN NEBU: 2.5000 mg | INHALATION_SOLUTION | Freq: Four times a day (QID) | RESPIRATORY_TRACT | Status: DC | PRN

## 2014-11-11 MED ORDER — FENTANYL CITRATE (PF) 100 MCG/2ML IJ SOLN
50.0000 ug | Freq: Once | INTRAMUSCULAR | Status: AC
  Administered 2014-11-11: 50 ug via INTRAVENOUS
  Filled 2014-11-11: qty 2

## 2014-11-11 MED ORDER — HYDROCODONE-ACETAMINOPHEN 5-325 MG PO TABS: 1.0000 | ORAL_TABLET | Freq: Once | ORAL | Status: DC

## 2014-11-11 MED ORDER — ONDANSETRON HCL 4 MG PO TABS: 4.0000 mg | ORAL_TABLET | Freq: Four times a day (QID) | ORAL | Status: DC

## 2014-11-11 MED ORDER — ONDANSETRON HCL 4 MG/2ML IJ SOLN
4.0000 mg | Freq: Once | INTRAMUSCULAR | Status: AC
  Administered 2014-11-11: 4 mg via INTRAVENOUS
  Filled 2014-11-11: qty 2

## 2014-11-11 MED ORDER — AZITHROMYCIN 250 MG PO TABS: 250.0000 mg | ORAL_TABLET | Freq: Every day | ORAL | Status: DC

## 2014-11-11 MED ORDER — OXYCODONE-ACETAMINOPHEN 5-325 MG PO TABS
1.0000 | ORAL_TABLET | Freq: Once | ORAL | Status: AC
  Administered 2014-11-11: 1 via ORAL
  Filled 2014-11-11: qty 1

## 2014-11-11 MED ORDER — SODIUM CHLORIDE 0.9 % IV BOLUS (SEPSIS)
500.0000 mL | Freq: Once | INTRAVENOUS | Status: AC
  Administered 2014-11-11: 500 mL via INTRAVENOUS

## 2014-11-11 MED ORDER — ALBUTEROL SULFATE HFA 108 (90 BASE) MCG/ACT IN AERS: 2.0000 | INHALATION_SPRAY | RESPIRATORY_TRACT | Status: DC | PRN

## 2014-11-11 MED ORDER — ALBUTEROL SULFATE HFA 108 (90 BASE) MCG/ACT IN AERS
2.0000 | INHALATION_SPRAY | Freq: Once | RESPIRATORY_TRACT | Status: DC
  Filled 2014-11-11: qty 6.7

## 2014-11-11 MED ORDER — IOHEXOL 350 MG/ML SOLN
80.0000 mL | Freq: Once | INTRAVENOUS | Status: AC | PRN
  Administered 2014-11-11: 80 mL via INTRAVENOUS

## 2014-11-11 MED ORDER — IPRATROPIUM-ALBUTEROL 0.5-2.5 (3) MG/3ML IN SOLN
3.0000 mL | Freq: Once | RESPIRATORY_TRACT | Status: AC
  Administered 2014-11-11: 3 mL via RESPIRATORY_TRACT
  Filled 2014-11-11: qty 3

## 2014-11-11 NOTE — ED Notes (Addendum)
Patient lab results of I-Stat Troponin of 0.00  And I-Stat CG4 of 1.58, not crossing over at this time Nurse in triage was informed, copy of results placed with paper work.

## 2014-11-11 NOTE — ED Notes (Addendum)
Pt assisted to BR by family member. Pt had small BM -- first in 4-5 days.

## 2014-11-11 NOTE — ED Provider Notes (Signed)
CSN: [id number]     Arrival date & time 11/11/14  1110 History   First MD Initiated Contact with Patient 11/11/14 1246     Chief Complaint  Patient presents with  . Post-op Problem  . Cough  . Shortness of Breath     Patient is a 54 y.o. male presenting with cough and shortness of breath. The history is provided by the patient. No language interpreter was used.  Cough Associated symptoms: shortness of breath   Shortness of Breath Associated symptoms: cough    Mr. Mcneill presents for evaluation of SOB and cough.  He had a cholecystectomy on 11/06/14 and since discharge has developed progressive cough, productive of yellow and grey sputum.  He has associated SOB, left sided pleuritic chest pain (two episodes), and fevers to 101 at home.  He denies any vomiting, extremity swelling.  Sxs are moderate, constant, worsening .  Past Medical History  Diagnosis Date  . HIV (human immunodeficiency virus infection) 1988  . Chronic anemia     normocytic  . Chronic folliculitis   . Gross hematuria   . Elevated PSA   . H/O pericarditis     2010--  myopercarditis--  resolved  . History of concussion     2012  &  2013  RESIDUAL HA'S --  RESOLVED  . Axillary lymphadenopathy   . History of MRSA infection 2010    infected boil  . History of gastric ulcer   . Lytic bone lesion of hip     WORK-UP DONE BY ONCOLOGIST DR HA --  NOT MALIGNANT  . Wears glasses   . Headache(784.0)     HX SEVERE FRONTAL HA'S  . Ulcer     hx of gastric  . Asthma     very rare  . Arthritis     r knee   . Pneumonia     hx of  . GERD (gastroesophageal reflux disease)   . Bronchitis   . Post concussion syndrome     resolved  . History of kidney stones   . Prostate cancer 04/25/13    gleason 3+3=6, volume 30 gm  . Environmental and seasonal allergies    Past Surgical History  Procedure Laterality Date  . Upper leg soft tissue biopsy  2012    left thigh  . Knee arthroscopy Right 1985  . Colonoscopy  12/26/2011     Procedure: COLONOSCOPY;  Surgeon: Lear Ng, MD;  Location: [facility];  Service: Endoscopy;  Laterality: N/A;  . Esophagogastroduodenoscopy  12/26/2011    Procedure: ESOPHAGOGASTRODUODENOSCOPY (EGD);  Surgeon: Lear Ng, MD;  Location: [facility];  Service: Endoscopy;  Laterality: N/A;  . Excisional bx left breast mass/  i  &  d left breast abscess  03-24-2009  . Excision chronic left breast abscess  09-21-2010  . Cystoscopy/retrograde/ureteroscopy Bilateral 04/25/2013    Procedure: CYSTOSCOPY/ BILATERAL RETROGRADES; BLADDER BIOPSIES;  Surgeon: Alexis Frock, MD;  Location: [facility];  Service: Urology;  Laterality: Bilateral;  . Prostate biopsy N/A 04/25/2013    Procedure: BIOPSY TRANSRECTAL ULTRASONIC PROSTATE (TUBP);  Surgeon: Alexis Frock, MD;  Location: [facility];  Service: Urology;  Laterality: N/A;  . Left hip biopsy  10/2012  . Left axilla biopsy  04/2013  . Robot assisted laparoscopic radical prostatectomy N/A 08/18/2013    Procedure: ROBOTIC ASSISTED LAPAROSCOPIC RADICAL PROSTATECTOMY;  Surgeon: Alexis Frock, MD;  Location: [facility];  Service: Urology;  Laterality: N/A;  . Lymphadenectomy Bilateral 08/18/2013  Procedure: LYMPHADENECTOMY "PELVIC LYMPH NODE DISSECTION";  Surgeon: Alexis Frock, MD;  Location: [facility];  Service: Urology;  Laterality: Bilateral;  . Esophagogastroduodenoscopy (egd) with propofol N/A 07/29/2014    Procedure: ESOPHAGOGASTRODUODENOSCOPY (EGD) WITH PROPOFOL;  Surgeon: Lear Ng, MD;  Location: [place];  Service: Endoscopy;  Laterality: N/A;  . Colonoscopy with propofol N/A 07/29/2014    Procedure: COLONOSCOPY WITH PROPOFOL;  Surgeon: Lear Ng, MD;  Location: [place];  Service: Endoscopy;  Laterality: N/A;  . Cardiac catheterization  03-23-2009  DR COOPER    NORMAL CORONARY ARTERIES  . Dental examination under anesthesia    . Cholecystectomy N/A 11/06/2014    Procedure:  LAPAROSCOPIC CHOLECYSTECTOMY WITH INTRAOPERATIVE CHOLANGIOGRAM;  Surgeon: Judeth Horn, MD;  Location: [facility];  Service: General;  Laterality: N/A;   Family History  Problem Relation Age of Onset  . Stroke Father   . Diabetes Father   . Cancer Father     brain cancer  . Hypertension Sister   . Cancer Maternal Uncle     prostate cancer  . Asthma Sister   . Asthma Father    History  Substance Use Topics  . Smoking status: Former Smoker -- 1.50 packs/day for 23 years    Types: Cigars, Cigarettes    Quit date: 05/22/2010  . Smokeless tobacco: Never Used  . Alcohol Use: No    Review of Systems  Respiratory: Positive for cough and shortness of breath.   All other systems reviewed and are negative.     Allergies  Morphine and related; Penicillins; Sulfonamide derivatives; and Tramadol  Home Medications   Prior to Admission medications   Medication Sig Start Date End Date Taking? Authorizing Provider  albuterol (PROVENTIL) (2.5 MG/3ML) 0.083% nebulizer solution Take 6 mLs (5 mg total) by nebulization every 6 (six) hours as needed for wheezing or shortness of breath. 09/20/14   Charlann Lange, PA-C  aspirin EC 81 MG tablet Take 81 mg by mouth 3 (three) times a week. Monday, Tuesday and Wednesday    Historical Provider, MD  Beclomethasone Dipropionate (QNASL) 80 MCG/ACT AERS Place 1 spray into the nose 2 (two) times daily. 07/22/14   Tanda Rockers, MD  gabapentin (NEURONTIN) 300 MG capsule Take 1 capsule (300 mg total) by mouth 4 (four) times daily as needed (nerve pain). 09/02/14   Dennie Bible, NP  HYDROcodone-acetaminophen (NORCO/VICODIN) 5-325 MG per tablet Take 1-2 tablets by mouth every 4 (four) hours as needed for moderate pain or severe pain. 11/06/14   Judeth Horn, MD  hydrOXYzine (ATARAX/VISTARIL) 25 MG tablet Take 1 tablet (25 mg total) by mouth every 6 (six) hours. 09/20/14   Charlann Lange, PA-C  ipratropium (ATROVENT) 0.02 % nebulizer solution Take 2.5 mLs (0.5 mg total)  by nebulization 3 (three) times daily as needed for wheezing or shortness of breath. Mix with xopenex 02/13/14   Carly J Rivet, MD  loratadine (CLARITIN) 10 MG tablet Take 10 mg by mouth daily.     Historical Provider, MD  mometasone-formoterol (DULERA) 200-5 MCG/ACT AERO Take 2 puffs first thing in am and then another 2 puffs about 12 hours later. 07/22/14   Tanda Rockers, MD  Multiple Vitamin (MULTIVITAMIN WITH MINERALS) TABS tablet Take 1 tablet by mouth at bedtime.    Historical Provider, MD  mupirocin ointment (BACTROBAN) 2 % APPLY TO THE AFFECTED AREA TWICE DAILY FOR 2 WEEKS. PLACE COTTON BETWEEN TOES TO ALLOW BETTER AERATION Patient taking differently: APPLY TO THE AFFECTED AREA TWICE DAILY  FOR 2 WEEKS. PLACE COTTON BETWEEN TOES TO ALLOW BETTER AERATION PRN 06/23/14   Campbell Riches, MD  NORVIR 100 MG TABS tablet TAKE 1 TABLET BY MOUTH EVERY DAY WITH PREZISTA AND TRUVADA 07/27/14   Campbell Riches, MD  omeprazole (PRILOSEC) 40 MG capsule Take 40 mg by mouth at bedtime.    Historical Provider, MD  ondansetron (ZOFRAN) 4 MG tablet TAKE 1 TABLET BY MOUTH EVERY 8 HOURS AS NEEDED FOR NAUSEA OR VOMITING 10/19/14   Campbell Riches, MD  Polyvinyl Alcohol-Povidone (REFRESH OP) Place 1 drop into both eyes at bedtime.    Historical Provider, MD  PREZISTA 800 MG tablet TAKE 1 TABLET BY MOUTH EVERY DAY 07/27/14   Campbell Riches, MD  raltegravir (ISENTRESS) 400 MG tablet Take 800 mg by mouth at bedtime.    Historical Provider, MD  tizanidine (ZANAFLEX) 6 MG capsule Take 1 capsule (6 mg total) by mouth 3 (three) times daily as needed for muscle spasms. 09/02/14   Dennie Bible, NP  traZODone (DESYREL) 50 MG tablet Take 50 mg by mouth at bedtime as needed for sleep.    Historical Provider, MD  traZODone (DESYREL) 50 MG tablet TAKE 1 TABLET BY MOUTH AT BEDTIME 11/07/14   Carly J Rivet, MD  TRUVADA 200-300 MG per tablet TAKE 1 TABLET BY MOUTH EVERY DAY WITH PREZISTA AND NORVIR 07/27/14   Campbell Riches, MD  VOLTAREN 1 % GEL APPLY TO THE AFFECTED AREA TWICE DAILY AS NEEDED Patient taking differently: APPLY TO THE AFFECTED AREA TWICE DAILY AS NEEDED FOR PAIN. 06/23/14   Campbell Riches, MD  zolpidem (AMBIEN CR) 12.5 MG CR tablet Take 1 tablet (12.5 mg total) by mouth at bedtime as needed for sleep. 02/13/14 02/13/15  Carly J Rivet, MD   BP 149/96 mmHg  Pulse 100  Temp(Src) 99.5 F (37.5 C) (Oral)  Resp 20  Ht 5\' 9"  (1.753 m)  Wt 205 lb (92.987 kg)  BMI 30.26 kg/m2  SpO2 96% Physical Exam  Constitutional: He is oriented to person, place, and time. He appears well-developed and well-nourished.  HENT:  Head: Normocephalic and atraumatic.  Cardiovascular: Normal rate and regular rhythm.   No murmur heard. Pulmonary/Chest: Effort normal. No respiratory distress.  Wheezes and rhonchi in bases  Abdominal: Soft. There is no tenderness. There is no rebound and no guarding.  Steri strips on abdomen, c/d/i  Musculoskeletal: He exhibits no edema or tenderness.  Neurological: He is alert and oriented to person, place, and time.  Skin: Skin is warm and dry.  Psychiatric: He has a normal mood and affect. His behavior is normal.  Nursing note and vitals reviewed.   ED Course  Procedures (including critical care time) Labs Review Labs Reviewed  CBC - Abnormal; Notable for the following:    Hemoglobin 10.0 (*)    HCT 33.9 (*)    MCV 73.7 (*)    MCH 21.7 (*)    MCHC 29.5 (*)    RDW 23.0 (*)    All other components within normal limits  BASIC METABOLIC PANEL - Abnormal; Notable for the following:    Glucose, Bld 120 (*)    Creatinine, Ser 1.33 (*)    All other components within normal limits  D-DIMER, QUANTITATIVE - Abnormal; Notable for the following:    D-Dimer, Quant 1.28 (*)    All other components within normal limits  BRAIN NATRIURETIC PEPTIDE  I-STAT TROPOININ, ED  I-STAT CG4 LACTIC ACID, ED  I-STAT  TROPOININ, ED    Imaging Review Dg Chest 2 View  11/11/2014    CLINICAL DATA:  54 year old male with cough productive of sputum for 2 days. Left lower chest pain. Recent cholecystectomy. Initial encounter.  EXAM: CHEST  2 VIEW  COMPARISON:  11/05/2014 and earlier.  FINDINGS: Lung volumes remain normal. Normal cardiac size and mediastinal contours. Visualized tracheal air column is within normal limits. Lung parenchyma is stable and clear. No pleural effusion or confluent pulmonary opacity. No acute osseous abnormality identified. Visualized bowel gas pattern is non obstructed.  IMPRESSION: No acute cardiopulmonary abnormality.   Electronically Signed   By: Genevie Ann M.D.   On: 11/11/2014 12:22   Ct Angio Chest Pe W/cm &/or Wo Cm  11/11/2014   CLINICAL DATA:  54 year old male with recent cholecystectomy. Chest pain and shortness of Breath today. Initial encounter. Current history of HIV.  EXAM: CT ANGIOGRAPHY CHEST WITH CONTRAST  TECHNIQUE: Multidetector CT imaging of the chest was performed using the standard protocol during bolus administration of intravenous contrast. Multiplanar CT image reconstructions and MIPs were obtained to evaluate the vascular anatomy.  CONTRAST:  110 mL Omnipaque 350.  COMPARISON:  Chest radiographs 1207 hr today and earlier. Chest CTA and CT Abdomen and Pelvis 02/02/2014  FINDINGS: 2 contrast bolus injections were performed. The first was aborted due to burning sensation reported by the patient. Ultimately adequate contrast bolus timing in the pulmonary arterial tree. Density in the main pulmonary artery of 185 Hounsfield units. However, there is respiratory motion in the upper and lower lobes. Subsequently the segmental and distal branches are not well evaluated. No central or hilar pulmonary artery filling defect identified.  Atelectatic changes to the major airways. Lower lung volumes on the second imaging attempts. Small calcified subpleural non blunt right upper lobe probe on series 703, image 20 persistent change. No acute or confluent  pulmonary opacity. No pleural effusion.  No pericardial effusion. Negative visualized aorta. No mediastinal or hilar lymphadenopathy. No axillary lymphadenopathy. Negative thoracic inlet.  The gallbladder is surgically absent. Minimal stranding in the gallbladder fossa. The hepatic flexure of colon closely abuts the gallbladder fossa and appears within normal limits.  Otherwise the visualized liver, spleen, pancreas, adrenal glands, and bowel in the upper abdomen are within normal limits. Visualized kidneys are stable with bilateral upper pole cyst. There is renal contrast excretion occurring.  Mild scoliosis.   No acute osseous abnormality identified.  Review of the MIP images confirms the above findings.  IMPRESSION: 1. Suboptimal evaluation for PE related to respiratory motion and less than ideal pulmonary artery contrast timing despite repeated attempts. No central or hilar pulmonary embolus. 2. Pulmonary atelectasis. No acute findings identified in the chest. 3. Mild postoperative changes in the gallbladder fossa with no adverse features identified.   Electronically Signed   By: Genevie Ann M.D.   On: 11/11/2014 16:49     EKG Interpretation   Date/Time:  Wednesday Nov 11 2014 11:27:17 EDT Ventricular Rate:  105 PR Interval:  154 QRS Duration: 86 QT Interval:  338 QTC Calculation: 446 R Axis:   73 Text Interpretation:  Sinus tachycardia Nonspecific T wave abnormality  Abnormal ECG Confirmed by Hazle Coca [id number]) on 11/11/2014 12:49:36 PM      MDM   Final diagnoses:  Acute bronchitis, unspecified organism   Pt is postoperative from lap chole here for evaluation of productive cough, pleuritic chest pain, fever to 101.  On exam pt is in no distress.  Ddimer ordered given  recent surgery/hospitalization and was elevated, pt otherwise low risk for PE.  CT scan limited with suboptimal bolus timing. Doubt PE.  Plan to check BLE dopplers given elevated ddimer and limited CT study.  If negative, plan to  d/c home with mdi for wheezing.  On repeat eval after one nebulizer treatment pt's lungs are clear bilaterally.  There is no evidence of pneumonia or acute infectious process.  Hx and presentation is not c/w ACS.  Pt care transferred pending LE doppler.    Quintella Reichert, MD 11/11/14 1757

## 2014-11-11 NOTE — Progress Notes (Signed)
*  Preliminary Results* Bilateral lower extremity venous duplex completed. Bilateral lower extremities are negative for deep vein thrombosis. There is no evidence of Baker's cyst bilaterally.  11/11/2014  Maudry Mayhew, RVT, RDCS, RDMS

## 2014-11-11 NOTE — ED Provider Notes (Signed)
Pt informed of neg [place] - will be d/c home - in agreeemnt.    Noemi Chapel, MD 11/11/14 [phone]

## 2014-11-11 NOTE — Telephone Encounter (Signed)
Pt's sister called to share that pt is Post-op from surgery 11/06/14 w/ productive cough/fever.  RN asked sister if she had contacted the pt's surgeon and she had not.  RN asked the pt if she had contacted the pt's PCP and she had not.  RN advised pt's sister to call both the pt's PCP and his Psychologist, sport and exercise.  Pt's sister stated that she would take the pt to the emergency room.  RN again suggested that the pt call to PCP or surgeon.

## 2014-11-11 NOTE — Discharge Instructions (Signed)

## 2014-11-11 NOTE — ED Notes (Signed)
Pt is post lap chole from may 6th. Pt reports SOB, cough, productive cough. Pt denies redness, drainage from site. Pt reports new SOB and dizziness.

## 2014-11-18 ENCOUNTER — Telehealth: Payer: Self-pay | Admitting: *Deleted

## 2014-11-18 ENCOUNTER — Telehealth: Payer: Self-pay | Admitting: Internal Medicine

## 2014-11-18 NOTE — Telephone Encounter (Signed)
Patient was advised in 2010 that you would accept him as a patient. Patient is calling to see if that offer is still on the table. Would you be willing to take this patient under your care?

## 2014-11-18 NOTE — Telephone Encounter (Signed)
Per pt's sister, fever and productive cough continuing.  Pt was seen in the ED 11/11/14.  Left the ED without the rx for Azithromycin being given to him.  Pt's sister stated that she called Dr. Richarda Blade office about what to do.  Sister explained that Dr. Richarda Blade office told her to contact [facility].  RN attempted to schedule the pt with Dr. [name] but unable to obtain an appointment until July, 2016.  RN also attempted to schedule the pt for a "work-in" appt with one of the "clinic MDs."  First available appt was Wed., May 25 in the afternoon.  RN advised the sister to call the pt's PCP office for an appointment.

## 2014-11-18 NOTE — Telephone Encounter (Signed)
Very sorry, but I am unable to take him as new patient at this time

## 2014-11-19 ENCOUNTER — Inpatient Hospital Stay (HOSPITAL_COMMUNITY): Payer: Medicare Other

## 2014-11-19 ENCOUNTER — Encounter (HOSPITAL_COMMUNITY): Payer: Self-pay | Admitting: Physical Medicine and Rehabilitation

## 2014-11-19 ENCOUNTER — Inpatient Hospital Stay (HOSPITAL_COMMUNITY)
Admission: EM | Admit: 2014-11-19 | Discharge: 2014-11-21 | DRG: 976 | Disposition: A | Discharge status: Home / Self Care | Payer: Medicare Other | Attending: Oncology | Admitting: Oncology

## 2014-11-19 ENCOUNTER — Emergency Department (HOSPITAL_COMMUNITY): Payer: Medicare Other

## 2014-11-19 DIAGNOSIS — G4700 Insomnia, unspecified: Secondary | ICD-10-CM | POA: Diagnosis present

## 2014-11-19 DIAGNOSIS — K219 Gastro-esophageal reflux disease without esophagitis: Secondary | ICD-10-CM | POA: Diagnosis present

## 2014-11-19 DIAGNOSIS — Z885 Allergy status to narcotic agent status: Secondary | ICD-10-CM

## 2014-11-19 DIAGNOSIS — Z9049 Acquired absence of other specified parts of digestive tract: Secondary | ICD-10-CM | POA: Diagnosis not present

## 2014-11-19 DIAGNOSIS — Z8614 Personal history of Methicillin resistant Staphylococcus aureus infection: Secondary | ICD-10-CM | POA: Diagnosis not present

## 2014-11-19 DIAGNOSIS — Z87442 Personal history of urinary calculi: Secondary | ICD-10-CM | POA: Diagnosis not present

## 2014-11-19 DIAGNOSIS — J309 Allergic rhinitis, unspecified: Secondary | ICD-10-CM | POA: Diagnosis present

## 2014-11-19 DIAGNOSIS — Z880 Allergy status to penicillin: Secondary | ICD-10-CM | POA: Diagnosis not present

## 2014-11-19 DIAGNOSIS — K5900 Constipation, unspecified: Secondary | ICD-10-CM | POA: Diagnosis present

## 2014-11-19 DIAGNOSIS — R112 Nausea with vomiting, unspecified: Secondary | ICD-10-CM | POA: Diagnosis not present

## 2014-11-19 DIAGNOSIS — Z7982 Long term (current) use of aspirin: Secondary | ICD-10-CM | POA: Diagnosis not present

## 2014-11-19 DIAGNOSIS — G629 Polyneuropathy, unspecified: Secondary | ICD-10-CM | POA: Diagnosis present

## 2014-11-19 DIAGNOSIS — Z8679 Personal history of other diseases of the circulatory system: Secondary | ICD-10-CM

## 2014-11-19 DIAGNOSIS — R05 Cough: Secondary | ICD-10-CM | POA: Diagnosis not present

## 2014-11-19 DIAGNOSIS — J189 Pneumonia, unspecified organism: Secondary | ICD-10-CM | POA: Diagnosis present

## 2014-11-19 DIAGNOSIS — Z87891 Personal history of nicotine dependence: Secondary | ICD-10-CM | POA: Diagnosis not present

## 2014-11-19 DIAGNOSIS — J45909 Unspecified asthma, uncomplicated: Secondary | ICD-10-CM | POA: Diagnosis present

## 2014-11-19 DIAGNOSIS — Y95 Nosocomial condition: Secondary | ICD-10-CM | POA: Diagnosis not present

## 2014-11-19 DIAGNOSIS — F0781 Postconcussional syndrome: Secondary | ICD-10-CM | POA: Diagnosis present

## 2014-11-19 DIAGNOSIS — M1990 Unspecified osteoarthritis, unspecified site: Secondary | ICD-10-CM | POA: Diagnosis present

## 2014-11-19 DIAGNOSIS — Z8546 Personal history of malignant neoplasm of prostate: Secondary | ICD-10-CM | POA: Diagnosis not present

## 2014-11-19 DIAGNOSIS — B20 Human immunodeficiency virus [HIV] disease: Secondary | ICD-10-CM | POA: Diagnosis present

## 2014-11-19 DIAGNOSIS — Z21 Asymptomatic human immunodeficiency virus [HIV] infection status: Secondary | ICD-10-CM | POA: Diagnosis present

## 2014-11-19 DIAGNOSIS — D509 Iron deficiency anemia, unspecified: Secondary | ICD-10-CM | POA: Diagnosis present

## 2014-11-19 DIAGNOSIS — Z9079 Acquired absence of other genital organ(s): Secondary | ICD-10-CM | POA: Diagnosis present

## 2014-11-19 DIAGNOSIS — J449 Chronic obstructive pulmonary disease, unspecified: Secondary | ICD-10-CM | POA: Diagnosis present

## 2014-11-19 DIAGNOSIS — J129 Viral pneumonia, unspecified: Secondary | ICD-10-CM | POA: Diagnosis not present

## 2014-11-19 DIAGNOSIS — I10 Essential (primary) hypertension: Secondary | ICD-10-CM | POA: Diagnosis present

## 2014-11-19 DIAGNOSIS — Z8711 Personal history of peptic ulcer disease: Secondary | ICD-10-CM | POA: Diagnosis not present

## 2014-11-19 DIAGNOSIS — Z882 Allergy status to sulfonamides status: Secondary | ICD-10-CM

## 2014-11-19 DIAGNOSIS — J209 Acute bronchitis, unspecified: Secondary | ICD-10-CM | POA: Diagnosis not present

## 2014-11-19 HISTORY — DX: Pneumonia, unspecified organism: J18.9

## 2014-11-19 LAB — CBC WITH DIFFERENTIAL/PLATELET
Basophils Absolute: 0.1 10*3/uL (ref 0.0–0.1)
Basophils Relative: 1 % (ref 0–1)
Eosinophils Absolute: 0.4 10*3/uL (ref 0.0–0.7)
Eosinophils Relative: 6 % — ABNORMAL HIGH (ref 0–5)
HCT: 29.4 % — ABNORMAL LOW (ref 39.0–52.0)
Hemoglobin: 8.9 g/dL — ABNORMAL LOW (ref 13.0–17.0)
Lymphocytes Relative: 33 % (ref 12–46)
Lymphs Abs: 2.1 10*3/uL (ref 0.7–4.0)
MCH: 21.3 pg — ABNORMAL LOW (ref 26.0–34.0)
MCHC: 30.3 g/dL (ref 30.0–36.0)
MCV: 70.5 fL — ABNORMAL LOW (ref 78.0–100.0)
Monocytes Absolute: 0.5 10*3/uL (ref 0.1–1.0)
Monocytes Relative: 8 % (ref 3–12)
Neutro Abs: 3.3 10*3/uL (ref 1.7–7.7)
Neutrophils Relative %: 52 % (ref 43–77)
Platelets: 319 10*3/uL (ref 150–400)
RBC: 4.17 MIL/uL — ABNORMAL LOW (ref 4.22–5.81)
RDW: 21.9 % — ABNORMAL HIGH (ref 11.5–15.5)
WBC: 6.4 10*3/uL (ref 4.0–10.5)

## 2014-11-19 LAB — COMPREHENSIVE METABOLIC PANEL
ALT: 13 U/L — ABNORMAL LOW (ref 17–63)
AST: 16 U/L (ref 15–41)
Albumin: 3.5 g/dL (ref 3.5–5.0)
Alkaline Phosphatase: 136 U/L — ABNORMAL HIGH (ref 38–126)
Anion gap: 8 (ref 5–15)
BUN: 6 mg/dL (ref 6–20)
CO2: 24 mmol/L (ref 22–32)
Calcium: 9 mg/dL (ref 8.9–10.3)
Chloride: 106 mmol/L (ref 101–111)
Creatinine, Ser: 1.23 mg/dL (ref 0.61–1.24)
GFR calc Af Amer: >60 mL/min (ref >60)
GFR calc non Af Amer: >60 mL/min (ref >60)
Glucose, Bld: 94 mg/dL (ref 65–99)
Potassium: 3.8 mmol/L (ref 3.5–5.1)
Sodium: 138 mmol/L (ref 135–145)
Total Bilirubin: 0.6 mg/dL (ref 0.3–1.2)
Total Protein: 7.2 g/dL (ref 6.5–8.1)

## 2014-11-19 LAB — EXPECTORATED SPUTUM ASSESSMENT W GRAM STAIN, RFLX TO RESP C

## 2014-11-19 LAB — PROCALCITONIN: Procalcitonin: <0.1 ng/mL

## 2014-11-19 LAB — TROPONIN I: Troponin I: <0.03 ng/mL (ref <0.031)

## 2014-11-19 LAB — I-STAT CG4 LACTIC ACID, ED
Lactic Acid, Venous: 0.72 mmol/L (ref 0.5–2.0)
Lactic Acid, Venous: 0.8 mmol/L (ref 0.5–2.0)

## 2014-11-19 LAB — EXPECTORATED SPUTUM ASSESSMENT W REFEX TO RESP CULTURE

## 2014-11-19 LAB — STREP PNEUMONIAE URINARY ANTIGEN: Strep Pneumo Urinary Antigen: NEGATIVE

## 2014-11-19 LAB — LACTATE DEHYDROGENASE: LDH: 157 U/L (ref 98–192)

## 2014-11-19 MED ORDER — IPRATROPIUM BROMIDE 0.02 % IN SOLN: 0.5000 mg | Freq: Three times a day (TID) | RESPIRATORY_TRACT | Status: DC | PRN

## 2014-11-19 MED ORDER — ALBUTEROL SULFATE (2.5 MG/3ML) 0.083% IN NEBU
2.0000 mL | INHALATION_SOLUTION | RESPIRATORY_TRACT | Status: DC | PRN
Start: 2014-11-19 — End: 2014-11-21

## 2014-11-19 MED ORDER — TRAZODONE HCL 50 MG PO TABS
50.0000 mg | ORAL_TABLET | Freq: Every evening | ORAL | Status: DC | PRN
Start: 2014-11-19 — End: 2014-11-21
  Administered 2014-11-20: 50 mg via ORAL
  Filled 2014-11-19 (×3): qty 1

## 2014-11-19 MED ORDER — HYDROXYZINE HCL 25 MG PO TABS
25.0000 mg | ORAL_TABLET | Freq: Four times a day (QID) | ORAL | Status: DC | PRN
  Administered 2014-11-20: 25 mg via ORAL
  Filled 2014-11-19: qty 1

## 2014-11-19 MED ORDER — RITONAVIR 100 MG PO TABS
100.0000 mg | ORAL_TABLET | Freq: Every day | ORAL | Status: DC
  Filled 2014-11-19: qty 1

## 2014-11-19 MED ORDER — LORATADINE 10 MG PO TABS
10.0000 mg | ORAL_TABLET | Freq: Every day | ORAL | Status: DC
  Administered 2014-11-20: 10 mg via ORAL
  Filled 2014-11-19 (×2): qty 1

## 2014-11-19 MED ORDER — DEXTROSE 5 % IV SOLN
2.0000 g | Freq: Three times a day (TID) | INTRAVENOUS | Status: DC
  Administered 2014-11-19 – 2014-11-20 (×2): 2 g via INTRAVENOUS
  Filled 2014-11-19 (×5): qty 2

## 2014-11-19 MED ORDER — DEXTROSE 5 % IV SOLN: 2.0000 g | Freq: Three times a day (TID) | INTRAVENOUS | Status: DC

## 2014-11-19 MED ORDER — VANCOMYCIN HCL 10 G IV SOLR
1250.0000 mg | Freq: Two times a day (BID) | INTRAVENOUS | Status: DC
  Filled 2014-11-19: qty 1250

## 2014-11-19 MED ORDER — DEXTROSE 5 % IV SOLN
2.0000 g | Freq: Once | INTRAVENOUS | Status: AC
  Administered 2014-11-19: 2 g via INTRAVENOUS
  Filled 2014-11-19: qty 2

## 2014-11-19 MED ORDER — PANTOPRAZOLE SODIUM 40 MG PO TBEC
40.0000 mg | DELAYED_RELEASE_TABLET | Freq: Every day | ORAL | Status: DC
  Administered 2014-11-20: 40 mg via ORAL
  Filled 2014-11-19: qty 1

## 2014-11-19 MED ORDER — ADULT MULTIVITAMIN W/MINERALS CH
1.0000 | ORAL_TABLET | Freq: Every day | ORAL | Status: DC
  Administered 2014-11-19 – 2014-11-20 (×2): 1 via ORAL
  Filled 2014-11-19 (×3): qty 1

## 2014-11-19 MED ORDER — IPRATROPIUM-ALBUTEROL 0.5-2.5 (3) MG/3ML IN SOLN
3.0000 mL | Freq: Once | RESPIRATORY_TRACT | Status: AC
  Administered 2014-11-19: 3 mL via RESPIRATORY_TRACT
  Filled 2014-11-19: qty 3

## 2014-11-19 MED ORDER — HYDROCOD POLST-CPM POLST ER 10-8 MG/5ML PO SUER
5.0000 mL | Freq: Once | ORAL | Status: AC
  Administered 2014-11-19: 5 mL via ORAL
  Filled 2014-11-19: qty 5

## 2014-11-19 MED ORDER — ENOXAPARIN SODIUM 40 MG/0.4ML ~~LOC~~ SOLN
40.0000 mg | SUBCUTANEOUS | Status: DC
  Administered 2014-11-19 – 2014-11-20 (×2): 40 mg via SUBCUTANEOUS
  Filled 2014-11-19 (×3): qty 0.4

## 2014-11-19 MED ORDER — DEXTROMETHORPHAN POLISTIREX ER 30 MG/5ML PO SUER
30.0000 mg | Freq: Two times a day (BID) | ORAL | Status: DC
  Administered 2014-11-19 – 2014-11-20 (×3): 30 mg via ORAL
  Filled 2014-11-19 (×5): qty 5

## 2014-11-19 MED ORDER — RITONAVIR 100 MG PO TABS
100.0000 mg | ORAL_TABLET | Freq: Every day | ORAL | Status: DC
  Administered 2014-11-19 – 2014-11-20 (×2): 100 mg via ORAL
  Filled 2014-11-19 (×3): qty 1

## 2014-11-19 MED ORDER — ZOLPIDEM TARTRATE 5 MG PO TABS
5.0000 mg | ORAL_TABLET | Freq: Every evening | ORAL | Status: DC | PRN
  Administered 2014-11-19 – 2014-11-20 (×2): 5 mg via ORAL
  Filled 2014-11-19 (×2): qty 1

## 2014-11-19 MED ORDER — DARUNAVIR ETHANOLATE 800 MG PO TABS
800.0000 mg | ORAL_TABLET | Freq: Every day | ORAL | Status: DC
  Administered 2014-11-19 – 2014-11-20 (×2): 800 mg via ORAL
  Filled 2014-11-19 (×3): qty 1

## 2014-11-19 MED ORDER — OXYCODONE-ACETAMINOPHEN 5-325 MG PO TABS
1.0000 | ORAL_TABLET | Freq: Four times a day (QID) | ORAL | Status: DC | PRN
Start: 2014-11-19 — End: 2014-11-20
  Administered 2014-11-19: 1 via ORAL
  Filled 2014-11-19: qty 1

## 2014-11-19 MED ORDER — EMTRICITABINE-TENOFOVIR DF 200-300 MG PO TABS
1.0000 | ORAL_TABLET | Freq: Every day | ORAL | Status: DC
  Administered 2014-11-19 – 2014-11-20 (×2): 1 via ORAL
  Filled 2014-11-19 (×3): qty 1

## 2014-11-19 MED ORDER — IPRATROPIUM-ALBUTEROL 0.5-2.5 (3) MG/3ML IN SOLN
3.0000 mL | Freq: Three times a day (TID) | RESPIRATORY_TRACT | Status: DC
  Administered 2014-11-20 – 2014-11-21 (×4): 3 mL via RESPIRATORY_TRACT
  Filled 2014-11-19 (×4): qty 3

## 2014-11-19 MED ORDER — GABAPENTIN 300 MG PO CAPS
300.0000 mg | ORAL_CAPSULE | Freq: Four times a day (QID) | ORAL | Status: DC | PRN
Start: 2014-11-19 — End: 2014-11-21
  Administered 2014-11-20: 300 mg via ORAL
  Filled 2014-11-19 (×3): qty 1

## 2014-11-19 MED ORDER — SODIUM CHLORIDE 0.9 % IV SOLN
INTRAVENOUS | Status: DC
  Administered 2014-11-19 (×2): via INTRAVENOUS

## 2014-11-19 MED ORDER — BUDESONIDE 0.25 MG/2ML IN SUSP
0.2500 mg | Freq: Two times a day (BID) | RESPIRATORY_TRACT | Status: DC
  Administered 2014-11-20 – 2014-11-21 (×3): 0.25 mg via RESPIRATORY_TRACT
  Filled 2014-11-19 (×7): qty 2

## 2014-11-19 MED ORDER — ONDANSETRON HCL 4 MG/2ML IJ SOLN: 4.0000 mg | Freq: Four times a day (QID) | INTRAMUSCULAR | Status: DC | PRN

## 2014-11-19 MED ORDER — BECLOMETHASONE DIPROPIONATE 80 MCG/ACT NA AERS: 1.0000 | INHALATION_SPRAY | Freq: Two times a day (BID) | NASAL | Status: DC

## 2014-11-19 MED ORDER — HYDROCOD POLST-CPM POLST ER 10-8 MG/5ML PO SUER
5.0000 mL | Freq: Two times a day (BID) | ORAL | Status: DC | PRN
  Administered 2014-11-20 (×2): 5 mL via ORAL
  Filled 2014-11-19 (×2): qty 5

## 2014-11-19 MED ORDER — ASPIRIN EC 81 MG PO TBEC: 81.0000 mg | DELAYED_RELEASE_TABLET | ORAL | Status: DC

## 2014-11-19 MED ORDER — EMTRICITABINE-TENOFOVIR DF 200-300 MG PO TABS: 1.0000 | ORAL_TABLET | Freq: Every day | ORAL | Status: DC

## 2014-11-19 MED ORDER — VANCOMYCIN HCL 10 G IV SOLR
1750.0000 mg | Freq: Once | INTRAVENOUS | Status: DC
  Administered 2014-11-19: 1750 mg via INTRAVENOUS
  Filled 2014-11-19: qty 1750

## 2014-11-19 MED ORDER — RALTEGRAVIR POTASSIUM 400 MG PO TABS
800.0000 mg | ORAL_TABLET | Freq: Every day | ORAL | Status: DC
Start: 2014-11-19 — End: 2014-11-21
  Administered 2014-11-19 – 2014-11-20 (×2): 800 mg via ORAL
  Filled 2014-11-19 (×3): qty 2

## 2014-11-19 MED ORDER — DARUNAVIR ETHANOLATE 800 MG PO TABS
800.0000 mg | ORAL_TABLET | Freq: Every day | ORAL | Status: DC
  Filled 2014-11-19: qty 1

## 2014-11-19 MED ORDER — MOMETASONE FURO-FORMOTEROL FUM 200-5 MCG/ACT IN AERO
2.0000 | INHALATION_SPRAY | Freq: Two times a day (BID) | RESPIRATORY_TRACT | Status: DC
  Administered 2014-11-20 – 2014-11-21 (×3): 2 via RESPIRATORY_TRACT
  Filled 2014-11-19: qty 8.8

## 2014-11-19 MED ORDER — ONDANSETRON HCL 4 MG PO TABS
4.0000 mg | ORAL_TABLET | Freq: Four times a day (QID) | ORAL | Status: DC
  Administered 2014-11-19 – 2014-11-21 (×7): 4 mg via ORAL
  Filled 2014-11-19 (×12): qty 1

## 2014-11-19 MED ORDER — IPRATROPIUM-ALBUTEROL 0.5-2.5 (3) MG/3ML IN SOLN
3.0000 mL | RESPIRATORY_TRACT | Status: DC
  Administered 2014-11-19: 3 mL via RESPIRATORY_TRACT
  Filled 2014-11-19: qty 3

## 2014-11-19 MED ORDER — SODIUM CHLORIDE 0.9 % IV SOLN
1000.0000 mL | INTRAVENOUS | Status: DC
  Administered 2014-11-19: 1000 mL via INTRAVENOUS

## 2014-11-19 NOTE — Progress Notes (Signed)
ANTIBIOTIC CONSULT NOTE - INITIAL  Pharmacy Consult for Vancomycin  Indication: pneumonia  Allergies  Allergen Reactions  . Morphine And Related Shortness Of Breath, Itching and Other (See Comments)     chest pain  . Penicillins Shortness Of Breath and Rash  . Sulfonamide Derivatives Shortness Of Breath and Rash  . Tramadol Itching and Nausea And Vomiting  . Tylenol [Acetaminophen] Rash   Vital Signs: Temp: 98.3 F (36.8 C) (05/19 1110) Temp Source: Oral (05/19 1110) BP: 137/98 mmHg (05/19 1245) Pulse Rate: 79 (05/19 1245)  Labs:  Recent Labs  11/19/14 1120  WBC 6.4  HGB 8.9*  PLT 319  CREATININE 1.23   Estimated Creatinine Clearance: 80 mL/min (by C-G formula based on Cr of 1.23).  Microbiology: No results found for this or any previous visit (from the past 720 hour(s)).  Medical History: Past Medical History  Diagnosis Date  . HIV (human immunodeficiency virus infection) 1988  . Chronic anemia     normocytic  . Chronic folliculitis   . Gross hematuria   . Elevated PSA   . H/O pericarditis     2010--  myopercarditis--  resolved  . History of concussion     2012  &  2013  RESIDUAL HA'S --  RESOLVED  . Axillary lymphadenopathy   . History of MRSA infection 2010    infected boil  . History of gastric ulcer   . Lytic bone lesion of hip     WORK-UP DONE BY ONCOLOGIST DR HA --  NOT MALIGNANT  . Wears glasses   . Headache(784.0)     HX SEVERE FRONTAL HA'S  . Ulcer     hx of gastric  . Asthma     very rare  . Arthritis     r knee   . Pneumonia     hx of  . GERD (gastroesophageal reflux disease)   . Bronchitis   . Post concussion syndrome     resolved  . History of kidney stones   . Prostate cancer 04/25/13    gleason 3+3=6, volume 30 gm  . Environmental and seasonal allergies    Assessment: 54 yo male presenting with cough, fevers (up to 101F), night sweats. Pt has lap chole on 5/6 with cough developing after and continues to worsen.  PMH: HIV,  chronic anemia, gastric ulcers, hx of PNA  Possible PNA (HCAP? - with recent surgery), WBC 6.4, Scr 1.23.  Pt AF in ER, LA neg - pt has PCN allergy w/ SOB  CXR 5/19 Early right middle lobe infiltrate.  Vanc 5/19>> Azactam 5/19>>  Goal of Therapy:  Vancomycin trough level 15-20 mcg/ml  Plan:  -Vanc load with 1750 mg x 1, followed by 1250 mg q12h -Target 15 - 20 mcg/mL -Vanc levels at [facility] -F/u culture results, monitor pt vital signs/CBC  Levester Fresh, PharmD, BCPS Clinical Pharmacist [phone] 5/19/20161:18 PM

## 2014-11-19 NOTE — ED Notes (Addendum)
Pt presents to department for evaluation of productive cough with yellow/green sputum, headache, body aches, fever of 102, and sinus congestion. Was seen recently and diagnosed with bronchitis, but wasn't prescribed antibiotics. No relief with nebulizer at home. Family also states abdominal swelling. Recently had gallbladder removed on 11/06/14 by Dr. Hulen Skains. Respirations unlabored upon arrival. Pt is alert and oriented x4.

## 2014-11-19 NOTE — Progress Notes (Signed)
Admission note:  Arrival Method: Patient arrived in stretcher from ED with staff and sister accompanying him. Mental Orientation:  Alert and oriented x 4. Telemetry: 6E-17, NSR, CCMD notified. Assessment: See doc flow sheets. Skin: Warm, dry and intact, no open areas noted.  Assessed with Allyson. IV: Left hand, patent, NS infusing 12ml/hr. Clean, dry, and intact. Pain: Denies any pain. Tubes: N/A Safety Measures: Bed in low position, call bell within reach. Fall Prevention Safety Plan: Reviewed the safety plan, understood and acknowledged. Admission Screening: In progress. 6700 Orientation: Patient has been oriented to the unit, staff and to the room.

## 2014-11-19 NOTE — ED Provider Notes (Signed)
CSN: [id number]     Arrival date & time 11/19/14  1103 History   First MD Initiated Contact with Patient 11/19/14 1223     Chief Complaint  Patient presents with  . Cough  . Headache  . Fever     (Consider location/radiation/quality/duration/timing/severity/associated sxs/prior Treatment) HPI Casey Brennan is a 54 y.o. male with history of HIV, chronic anemia, gastric ulcers, history of pneumonia, presents to emergency department complaining of cough, fevers, night sweats. Patient had a laparoscopic cholecystectomy on May 6, since then he developed a cough that has been getting worse. Patient states that his cough is productive with thick yellow sputum. He admits to fevers up to 101, last time he had a fever was at 3 AM this morning. He admits to nasal and chest congestion. He states he is unable to sleep at night because of the coughing. He was seen here 8 days ago, had unremarkable workup, was discharged home. He has been unable to follow-up with his doctor because there has not been any appointments. He called his primary care doctor's office today and a total to come to emergency department. Patient states he has been doing over-the-counter cold and flu medications as well as DuoNeb nebulized treatments 3 times a day. He states nothing is helping. He denies any chest pain, however states he is having pain in his abdomen and lower back while coughing.  Past Medical History  Diagnosis Date  . HIV (human immunodeficiency virus infection) 1988  . Chronic anemia     normocytic  . Chronic folliculitis   . Gross hematuria   . Elevated PSA   . H/O pericarditis     2010--  myopercarditis--  resolved  . History of concussion     2012  &  2013  RESIDUAL HA'S --  RESOLVED  . Axillary lymphadenopathy   . History of MRSA infection 2010    infected boil  . History of gastric ulcer   . Lytic bone lesion of hip     WORK-UP DONE BY ONCOLOGIST DR HA --  NOT MALIGNANT  . Wears glasses   .  Headache(784.0)     HX SEVERE FRONTAL HA'S  . Ulcer     hx of gastric  . Asthma     very rare  . Arthritis     r knee   . Pneumonia     hx of  . GERD (gastroesophageal reflux disease)   . Bronchitis   . Post concussion syndrome     resolved  . History of kidney stones   . Prostate cancer 04/25/13    gleason 3+3=6, volume 30 gm  . Environmental and seasonal allergies    Past Surgical History  Procedure Laterality Date  . Upper leg soft tissue biopsy  2012    left thigh  . Knee arthroscopy Right 1985  . Colonoscopy  12/26/2011    Procedure: COLONOSCOPY;  Surgeon: Lear Ng, MD;  Location: [facility];  Service: Endoscopy;  Laterality: N/A;  . Esophagogastroduodenoscopy  12/26/2011    Procedure: ESOPHAGOGASTRODUODENOSCOPY (EGD);  Surgeon: Lear Ng, MD;  Location: [facility];  Service: Endoscopy;  Laterality: N/A;  . Excisional bx left breast mass/  i  &  d left breast abscess  03-24-2009  . Excision chronic left breast abscess  09-21-2010  . Cystoscopy/retrograde/ureteroscopy Bilateral 04/25/2013    Procedure: CYSTOSCOPY/ BILATERAL RETROGRADES; BLADDER BIOPSIES;  Surgeon: Alexis Frock, MD;  Location: [facility];  Service: Urology;  Laterality: Bilateral;  .  Prostate biopsy N/A 04/25/2013    Procedure: BIOPSY TRANSRECTAL ULTRASONIC PROSTATE (TUBP);  Surgeon: Alexis Frock, MD;  Location: [facility];  Service: Urology;  Laterality: N/A;  . Left hip biopsy  10/2012  . Left axilla biopsy  04/2013  . Robot assisted laparoscopic radical prostatectomy N/A 08/18/2013    Procedure: ROBOTIC ASSISTED LAPAROSCOPIC RADICAL PROSTATECTOMY;  Surgeon: Alexis Frock, MD;  Location: [facility];  Service: Urology;  Laterality: N/A;  . Lymphadenectomy Bilateral 08/18/2013    Procedure: LYMPHADENECTOMY "PELVIC LYMPH NODE DISSECTION";  Surgeon: Alexis Frock, MD;  Location: [facility];  Service: Urology;  Laterality: Bilateral;  .  Esophagogastroduodenoscopy (egd) with propofol N/A 07/29/2014    Procedure: ESOPHAGOGASTRODUODENOSCOPY (EGD) WITH PROPOFOL;  Surgeon: Lear Ng, MD;  Location: [place];  Service: Endoscopy;  Laterality: N/A;  . Colonoscopy with propofol N/A 07/29/2014    Procedure: COLONOSCOPY WITH PROPOFOL;  Surgeon: Lear Ng, MD;  Location: [place];  Service: Endoscopy;  Laterality: N/A;  . Cardiac catheterization  03-23-2009  DR COOPER    NORMAL CORONARY ARTERIES  . Dental examination under anesthesia    . Cholecystectomy N/A 11/06/2014    Procedure: LAPAROSCOPIC CHOLECYSTECTOMY WITH INTRAOPERATIVE CHOLANGIOGRAM;  Surgeon: Judeth Horn, MD;  Location: [facility];  Service: General;  Laterality: N/A;   Family History  Problem Relation Age of Onset  . Stroke Father   . Diabetes Father   . Cancer Father     brain cancer  . Hypertension Sister   . Cancer Maternal Uncle     prostate cancer  . Asthma Sister   . Asthma Father    History  Substance Use Topics  . Smoking status: Former Smoker -- 1.50 packs/day for 23 years    Types: Cigars, Cigarettes    Quit date: 05/22/2010  . Smokeless tobacco: Never Used  . Alcohol Use: No    Review of Systems  Constitutional: Positive for fever and chills.  HENT: Positive for congestion.   Respiratory: Positive for cough and shortness of breath. Negative for chest tightness.   Cardiovascular: Negative for chest pain, palpitations and leg swelling.  Gastrointestinal: Positive for abdominal pain. Negative for nausea, vomiting, diarrhea and abdominal distention.  Genitourinary: Negative for dysuria, urgency, frequency and hematuria.  Musculoskeletal: Positive for back pain. Negative for myalgias, arthralgias, neck pain and neck stiffness.  Skin: Negative for rash.  Allergic/Immunologic: Positive for immunocompromised state.  Neurological: Negative for dizziness, weakness, light-headedness, numbness and headaches.  All other systems reviewed  and are negative.     Allergies  Morphine and related; Penicillins; Sulfonamide derivatives; Tramadol; and Tylenol  Home Medications   Prior to Admission medications   Medication Sig Start Date End Date Taking? Authorizing Provider  albuterol (PROVENTIL HFA;VENTOLIN HFA) 108 (90 BASE) MCG/ACT inhaler Inhale 2 puffs into the lungs every 4 (four) hours as needed for wheezing or shortness of breath. 11/11/14   Noemi Chapel, MD  albuterol (PROVENTIL) (2.5 MG/3ML) 0.083% nebulizer solution Take 3 mLs (2.5 mg total) by nebulization every 6 (six) hours as needed for wheezing or shortness of breath. 11/11/14   Noemi Chapel, MD  aspirin EC 81 MG tablet Take 81 mg by mouth 3 (three) times a week. Monday, Tuesday and Wednesday    Historical Provider, MD  azithromycin (ZITHROMAX) 250 MG tablet Take 1 tablet (250 mg total) by mouth daily. Take first 2 tablets together, then 1 every day until finished. 11/11/14   Noemi Chapel, MD  Beclomethasone Dipropionate (QNASL) 80 MCG/ACT AERS Place 1  spray into the nose 2 (two) times daily. 07/22/14   Tanda Rockers, MD  gabapentin (NEURONTIN) 300 MG capsule Take 1 capsule (300 mg total) by mouth 4 (four) times daily as needed (nerve pain). 09/02/14   Dennie Bible, NP  HYDROcodone-acetaminophen (NORCO/VICODIN) 5-325 MG per tablet Take 1-2 tablets by mouth every 4 (four) hours as needed for moderate pain or severe pain. 11/06/14   Judeth Horn, MD  hydrOXYzine (ATARAX/VISTARIL) 25 MG tablet Take 1 tablet (25 mg total) by mouth every 6 (six) hours. Patient taking differently: Take 25 mg by mouth every 6 (six) hours as needed for anxiety.  09/20/14   Charlann Lange, PA-C  ipratropium (ATROVENT) 0.02 % nebulizer solution Take 2.5 mLs (0.5 mg total) by nebulization 3 (three) times daily as needed for wheezing or shortness of breath. Mix with xopenex 02/13/14   Carly J Rivet, MD  loratadine (CLARITIN) 10 MG tablet Take 10 mg by mouth daily.     Historical Provider, MD   mometasone-formoterol (DULERA) 200-5 MCG/ACT AERO Take 2 puffs first thing in am and then another 2 puffs about 12 hours later. 07/22/14   Tanda Rockers, MD  Multiple Vitamin (MULTIVITAMIN WITH MINERALS) TABS tablet Take 1 tablet by mouth at bedtime.    Historical Provider, MD  mupirocin ointment (BACTROBAN) 2 % APPLY TO THE AFFECTED AREA TWICE DAILY FOR 2 WEEKS. PLACE COTTON BETWEEN TOES TO ALLOW BETTER AERATION Patient taking differently: APPLY TO THE AFFECTED AREA TWICE DAILY FOR 2 WEEKS. PLACE COTTON BETWEEN TOES TO ALLOW BETTER AERATION PRN 06/23/14   Campbell Riches, MD  NORVIR 100 MG TABS tablet TAKE 1 TABLET BY MOUTH EVERY DAY WITH PREZISTA AND TRUVADA 07/27/14   Campbell Riches, MD  omeprazole (PRILOSEC) 40 MG capsule Take 40 mg by mouth at bedtime.    Historical Provider, MD  ondansetron (ZOFRAN) 4 MG tablet TAKE 1 TABLET BY MOUTH EVERY 8 HOURS AS NEEDED FOR NAUSEA OR VOMITING 10/19/14   Campbell Riches, MD  ondansetron (ZOFRAN) 4 MG tablet Take 1 tablet (4 mg total) by mouth every 6 (six) hours. 11/11/14   Quintella Reichert, MD  oxyCODONE-acetaminophen (PERCOCET/ROXICET) 5-325 MG per tablet Take 1 tablet by mouth every 6 (six) hours as needed for severe pain. 11/11/14   Quintella Reichert, MD  Polyvinyl Alcohol-Povidone (REFRESH OP) Place 1 drop into both eyes at bedtime.    Historical Provider, MD  PREZISTA 800 MG tablet TAKE 1 TABLET BY MOUTH EVERY DAY 07/27/14   Campbell Riches, MD  raltegravir (ISENTRESS) 400 MG tablet Take 800 mg by mouth at bedtime.    Historical Provider, MD  tizanidine (ZANAFLEX) 6 MG capsule Take 1 capsule (6 mg total) by mouth 3 (three) times daily as needed for muscle spasms. 09/02/14   Dennie Bible, NP  traZODone (DESYREL) 50 MG tablet Take 50 mg by mouth at bedtime as needed for sleep.    Historical Provider, MD  traZODone (DESYREL) 50 MG tablet TAKE 1 TABLET BY MOUTH AT BEDTIME Patient taking differently: TAKE 1 TABLET BY MOUTH AT BEDTIME AS NEEDED 11/07/14    Carly J Rivet, MD  TRUVADA 200-300 MG per tablet TAKE 1 TABLET BY MOUTH EVERY DAY WITH PREZISTA AND NORVIR 07/27/14   Campbell Riches, MD  VOLTAREN 1 % GEL APPLY TO THE AFFECTED AREA TWICE DAILY AS NEEDED Patient taking differently: APPLY TO THE AFFECTED AREA TWICE DAILY AS NEEDED FOR PAIN. 06/23/14   Campbell Riches, MD  zolpidem (  AMBIEN CR) 12.5 MG CR tablet Take 1 tablet (12.5 mg total) by mouth at bedtime as needed for sleep. 02/13/14 02/13/15  Carly J Rivet, MD   BP 149/92 mmHg  Pulse 87  Temp(Src) 98.3 F (36.8 C) (Oral)  Resp 20  SpO2 92% Physical Exam  Constitutional: He is oriented to person, place, and time. He appears well-developed and well-nourished. No distress.  HENT:  Head: Normocephalic and atraumatic.  Left Ear: External ear normal.  Mouth/Throat: Oropharynx is clear and moist.  Eyes: Conjunctivae and EOM are normal. Pupils are equal, round, and reactive to light.  Neck: Neck supple.  Cardiovascular: Normal rate, regular rhythm and normal heart sounds.   Pulmonary/Chest: Effort normal. No respiratory distress. He has wheezes. He has no rales.  Abdominal: Soft. Bowel sounds are normal. He exhibits no distension. There is no tenderness. There is no rebound.  Musculoskeletal: He exhibits no edema.  Neurological: He is alert and oriented to person, place, and time.  Skin: Skin is warm and dry.  Nursing note and vitals reviewed.   ED Course  Procedures (including critical care time) Labs Review Labs Reviewed  CBC WITH DIFFERENTIAL/PLATELET - Abnormal; Notable for the following:    RBC 4.17 (*)    Hemoglobin 8.9 (*)    HCT 29.4 (*)    MCV 70.5 (*)    MCH 21.3 (*)    RDW 21.9 (*)    All other components within normal limits  COMPREHENSIVE METABOLIC PANEL - Abnormal; Notable for the following:    ALT 13 (*)    Alkaline Phosphatase 136 (*)    All other components within normal limits  I-STAT CG4 LACTIC ACID, ED    Imaging Review Dg Chest 2 View  11/19/2014    CLINICAL DATA:  Productive cough  EXAM: CHEST  2 VIEW  COMPARISON:  11/11/2014  FINDINGS: Cardiac shadow is within normal limits. Some increasing infiltrate is noted in the right lung base projecting in the right middle lobe on the lateral projection. No other focal infiltrate is seen. No bony abnormality is noted.  IMPRESSION: Early right middle lobe infiltrate.   Electronically Signed   By: Inez Catalina M.D.   On: 11/19/2014 12:38     EKG Interpretation   Date/Time:  Thursday Nov 19 2014 12:47:43 EDT Ventricular Rate:  80 PR Interval:  177 QRS Duration: 86 QT Interval:  393 QTC Calculation: 453 R Axis:   61 Text Interpretation:  Sinus rhythm Probable left atrial enlargement ST  elev, probable normal early repol pattern No significant change since last  tracing Confirmed by [place]  MD, [place] [id number]) on 11/19/2014 12:58:15 PM      MDM   Final diagnoses:  HCAP (healthcare-associated pneumonia)    patient emergency department with persistent cough for now 2 weeks. Cough started after abdominal surgery. Patient reports headaches, fevers, inability to sleep due to cough. Mild wheezing noted on exam, vital signs show borderline oxygen saturation low 90s, slightly elevated blood pressure, otherwise unremarkable. He is afebrile here. Will get labs, chest x-ray, Tussionex and DuoNeb ordered  Patient's chest x-ray showed developing pneumonia. Given his immunocompromise status, prolonged symptoms, inability to sleep, will get admitted to medical service. HCAP  Antibiotics started per protocol. I spoke with the internal medicine teaching service who are patient's primary care providers, they will admit.  Filed Vitals:   11/19/14 1430 11/19/14 1445 11/19/14 1500 11/19/14 1608  BP: 162/101 154/97 144/94 146/96  Pulse: 84 84 77 77  Temp:  98.9 F (37.2 C)  TempSrc:    Oral  Resp: 19 15 19 18   SpO2: 95% 95% 94% 95%       Jeannett Senior, PA-C 11/19/14 [place],  MD 11/19/14 2049

## 2014-11-19 NOTE — H&P (Signed)
Date: 11/19/2014               Patient Name:  Casey Brennan MRN: [id number]  DOB: 06-Apr-1961 Age / Sex: 54 y.o., male   PCP: Juliet Rude, MD              Medical Service: Internal Medicine Teaching Service              Attending Physician: Dr. Murriel Hopper, MD    First Contact: Dr. Trudee Kuster Pager: [phone]  Second Contact: Dr. Heber Bethel Pager: [phone]            After Hours (After 5p/  First Contact Pager: [phone]  weekends / holidays): Second Contact Pager: [phone]   Chief Complaint:  Cough.  History of Present Illness: VLAD MAYBERRY is a 54 year old man with history of HIV (last CD4 490, HIV undetectable on 04/14/2014), anemia, asthma, arthritis, prostate cancer status post prostatectomy in 2014, and GERD presenting with cough and fever. The patient had a laparoscopic cholecystectomy on 11/06/2014. After returning home the following day, he reports developing a cough productive of thick yellow sputum that has been getting worse. He reports fevers up to 102 at home along with night sweats, body aches, and headaches.  He has had associated nasal and chest congestion, and he has been unable to sleep at night due to coughing. He denies any sick contacts. He was seen in the ER 8 days ago, and workup including CTA to look for pulmonary embolism was negative (suboptimal evaluation of PE due to respiratory motion). Per the sister, he was supposed to receive a prescription for azithromycin, but he did not receive this prescription. Since that time, his symptoms have gotten worse. He attempted to get into see his infectious disease doctor yesterday, but no appointments were available.  He has been taking over-the-counter flu and cold medicines along with his home DuoNeb's up to 3 times per day, but nothing is helping with his cough. He denies chest pain, but reports pain in his abdomen and back secondary to his coughing. He has also noted a small bump adjacent to one of his scars from his  surgery along with worsening abdominal distention over the past couple of days. She reports some constipation with his last bowel movement approximately 3 days ago, but he thinks he can have a bowel movement today. He has continued to pass gas.  In the ED, he was afebrile and his vital signs were normal. Chest x-ray showed an early right middle lobe infiltrate. He was Tussionex, DuoNeb's, 1 L of normal saline, and he was started on vancomycin and aztreonam for HCAP.  Review of Systems: Review of Systems  Constitutional: Positive for fever, chills, malaise/fatigue and diaphoresis.  HENT: Positive for congestion. Negative for sore throat.   Eyes: Negative for blurred vision and photophobia.  Respiratory: Positive for cough, sputum production and shortness of breath. Negative for hemoptysis and wheezing.   Cardiovascular: Negative for chest pain and orthopnea.  Gastrointestinal: Positive for nausea and constipation. Negative for vomiting, abdominal pain, diarrhea, blood in stool and melena.  Genitourinary: Negative for dysuria, frequency and hematuria.  Musculoskeletal: Positive for myalgias and back pain. Negative for joint pain and neck pain.  Skin: Negative for rash.  Neurological: Positive for weakness. Negative for dizziness, sensory change, focal weakness and headaches.    Meds:  (Not in a hospital admission) Current Facility-Administered Medications  Medication Dose Route Frequency Provider Last Rate Last Dose  . 0.9 %  sodium chloride infusion   Intravenous Continuous Cresenciano Genre, MD      . aztreonam (AZACTAM) 2 g in dextrose 5 % 50 mL IVPB  2 g Intravenous 3 times per day Adora Fridge, RPH      . ipratropium (ATROVENT) nebulizer solution 0.5 mg  0.5 mg Nebulization TID PRN Cresenciano Genre, MD      . Derrill Memo ON 11/20/2014] vancomycin (VANCOCIN) 1,250 mg in sodium chloride 0.9 % 250 mL IVPB  1,250 mg Intravenous Q12H Wynell Balloon, RPH      . vancomycin (VANCOCIN) 1,750 mg in  sodium chloride 0.9 % 500 mL IVPB  1,750 mg Intravenous Once Wynell Balloon, RPH 250 mL/hr at 11/19/14 1359 1,750 mg at 11/19/14 1359   Current Outpatient Prescriptions  Medication Sig Dispense Refill  . albuterol (PROVENTIL HFA;VENTOLIN HFA) 108 (90 BASE) MCG/ACT inhaler Inhale 2 puffs into the lungs every 4 (four) hours as needed for wheezing or shortness of breath. 1 Inhaler 3  . albuterol (PROVENTIL) (2.5 MG/3ML) 0.083% nebulizer solution Take 3 mLs (2.5 mg total) by nebulization every 6 (six) hours as needed for wheezing or shortness of breath. 75 mL 12  . aspirin EC 81 MG tablet Take 81 mg by mouth 3 (three) times a week. Monday, Tuesday and Wednesday    . Beclomethasone Dipropionate (QNASL) 80 MCG/ACT AERS Place 1 spray into the nose 2 (two) times daily. 1 Inhaler 0  . gabapentin (NEURONTIN) 300 MG capsule Take 1 capsule (300 mg total) by mouth 4 (four) times daily as needed (nerve pain). 120 capsule 7  . HYDROcodone-acetaminophen (NORCO/VICODIN) 5-325 MG per tablet Take 1-2 tablets by mouth every 4 (four) hours as needed for moderate pain or severe pain. 40 tablet 0  . hydrOXYzine (ATARAX/VISTARIL) 25 MG tablet Take 1 tablet (25 mg total) by mouth every 6 (six) hours. (Patient taking differently: Take 25 mg by mouth every 6 (six) hours as needed for anxiety. ) 12 tablet 0  . ipratropium (ATROVENT) 0.02 % nebulizer solution Take 2.5 mLs (0.5 mg total) by nebulization 3 (three) times daily as needed for wheezing or shortness of breath. Mix with xopenex 75 mL 1  . loratadine (CLARITIN) 10 MG tablet Take 10 mg by mouth daily.     . mometasone-formoterol (DULERA) 200-5 MCG/ACT AERO Take 2 puffs first thing in am and then another 2 puffs about 12 hours later. 1 Inhaler 11  . Multiple Vitamin (MULTIVITAMIN WITH MINERALS) TABS tablet Take 1 tablet by mouth at bedtime.    . mupirocin ointment (BACTROBAN) 2 % APPLY TO THE AFFECTED AREA TWICE DAILY FOR 2 WEEKS. PLACE COTTON BETWEEN TOES TO ALLOW BETTER  AERATION (Patient taking differently: APPLY TO THE AFFECTED AREA TWICE DAILY FOR 2 WEEKS. PLACE COTTON BETWEEN TOES TO ALLOW BETTER AERATION PRN) 66 g 0  . NORVIR 100 MG TABS tablet TAKE 1 TABLET BY MOUTH EVERY DAY WITH PREZISTA AND TRUVADA 30 tablet 6  . omeprazole (PRILOSEC) 40 MG capsule Take 40 mg by mouth at bedtime.    . ondansetron (ZOFRAN) 4 MG tablet Take 1 tablet (4 mg total) by mouth every 6 (six) hours. 12 tablet 0  . oxyCODONE-acetaminophen (PERCOCET/ROXICET) 5-325 MG per tablet Take 1 tablet by mouth every 6 (six) hours as needed for severe pain. 15 tablet 0  . Polyvinyl Alcohol-Povidone (REFRESH OP) Place 1 drop into both eyes at bedtime.    [name] PREZISTA 800 MG tablet TAKE 1 TABLET BY MOUTH EVERY DAY 30  tablet 6  . raltegravir (ISENTRESS) 400 MG tablet Take 800 mg by mouth at bedtime.    . tizanidine (ZANAFLEX) 6 MG capsule Take 1 capsule (6 mg total) by mouth 3 (three) times daily as needed for muscle spasms. 90 capsule 6  . traZODone (DESYREL) 50 MG tablet TAKE 1 TABLET BY MOUTH AT BEDTIME (Patient taking differently: TAKE 1 TABLET BY MOUTH AT BEDTIME AS NEEDED) 30 tablet 3  . TRUVADA 200-300 MG per tablet TAKE 1 TABLET BY MOUTH EVERY DAY WITH PREZISTA AND NORVIR 30 tablet 6  . VOLTAREN 1 % GEL APPLY TO THE AFFECTED AREA TWICE DAILY AS NEEDED (Patient taking differently: APPLY TO THE AFFECTED AREA TWICE DAILY AS NEEDED FOR PAIN.) 100 g 0  . zolpidem (AMBIEN CR) 12.5 MG CR tablet Take 1 tablet (12.5 mg total) by mouth at bedtime as needed for sleep. 30 tablet 1    Allergies: Allergies as of 11/19/2014 - Review Complete 11/19/2014  Allergen Reaction Noted  . Morphine and related Shortness Of Breath, Itching, and Other (See Comments) 06/13/2013  . Penicillins Shortness Of Breath and Rash   . Sulfonamide derivatives Shortness Of Breath and Rash   . Tramadol Itching and Nausea And Vomiting 07/28/2014  . Tylenol [acetaminophen] Rash 11/11/2014   Past Medical History  Diagnosis Date    . HIV (human immunodeficiency virus infection) 1988  . Chronic anemia     normocytic  . Chronic folliculitis   . Gross hematuria   . Elevated PSA   . H/O pericarditis     2010--  myopercarditis--  resolved  . History of concussion     2012  &  2013  RESIDUAL HA'S --  RESOLVED  . Axillary lymphadenopathy   . History of MRSA infection 2010    infected boil  . History of gastric ulcer   . Lytic bone lesion of hip     WORK-UP DONE BY ONCOLOGIST DR HA --  NOT MALIGNANT  . Wears glasses   . Headache(784.0)     HX SEVERE FRONTAL HA'S  . Ulcer     hx of gastric  . Asthma     very rare  . Arthritis     r knee   . Pneumonia     hx of  . GERD (gastroesophageal reflux disease)   . Bronchitis   . Post concussion syndrome     resolved  . History of kidney stones   . Prostate cancer 04/25/13    gleason 3+3=6, volume 30 gm  . Environmental and seasonal allergies    Past Surgical History  Procedure Laterality Date  . Upper leg soft tissue biopsy  2012    left thigh  . Knee arthroscopy Right 1985  . Colonoscopy  12/26/2011    Procedure: COLONOSCOPY;  Surgeon: Lear Ng, MD;  Location: [facility];  Service: Endoscopy;  Laterality: N/A;  . Esophagogastroduodenoscopy  12/26/2011    Procedure: ESOPHAGOGASTRODUODENOSCOPY (EGD);  Surgeon: Lear Ng, MD;  Location: [facility];  Service: Endoscopy;  Laterality: N/A;  . Excisional bx left breast mass/  i  &  d left breast abscess  03-24-2009  . Excision chronic left breast abscess  09-21-2010  . Cystoscopy/retrograde/ureteroscopy Bilateral 04/25/2013    Procedure: CYSTOSCOPY/ BILATERAL RETROGRADES; BLADDER BIOPSIES;  Surgeon: Alexis Frock, MD;  Location: [facility];  Service: Urology;  Laterality: Bilateral;  . Prostate biopsy N/A 04/25/2013    Procedure: BIOPSY TRANSRECTAL ULTRASONIC PROSTATE (TUBP);  Surgeon: Alexis Frock, MD;  Location: [facility];  Service: Urology;  Laterality:  N/A;  . Left hip biopsy  10/2012  . Left axilla biopsy  04/2013  . Robot assisted laparoscopic radical prostatectomy N/A 08/18/2013    Procedure: ROBOTIC ASSISTED LAPAROSCOPIC RADICAL PROSTATECTOMY;  Surgeon: Alexis Frock, MD;  Location: [facility];  Service: Urology;  Laterality: N/A;  . Lymphadenectomy Bilateral 08/18/2013    Procedure: LYMPHADENECTOMY "PELVIC LYMPH NODE DISSECTION";  Surgeon: Alexis Frock, MD;  Location: [facility];  Service: Urology;  Laterality: Bilateral;  . Esophagogastroduodenoscopy (egd) with propofol N/A 07/29/2014    Procedure: ESOPHAGOGASTRODUODENOSCOPY (EGD) WITH PROPOFOL;  Surgeon: Lear Ng, MD;  Location: [facility];  Service: Endoscopy;  Laterality: N/A;  . Colonoscopy with propofol N/A 07/29/2014    Procedure: COLONOSCOPY WITH PROPOFOL;  Surgeon: Lear Ng, MD;  Location: [facility];  Service: Endoscopy;  Laterality: N/A;  . Cardiac catheterization  03-23-2009  DR COOPER    NORMAL CORONARY ARTERIES  . Dental examination under anesthesia    . Cholecystectomy N/A 11/06/2014    Procedure: LAPAROSCOPIC CHOLECYSTECTOMY WITH INTRAOPERATIVE CHOLANGIOGRAM;  Surgeon: Judeth Horn, MD;  [facility];  Service: General;  Laterality: N/A;   Family History  Problem Relation Age of Onset  . Stroke Father   . Diabetes Father   . Cancer Father     brain cancer  . Hypertension Sister   . Cancer Maternal Uncle     prostate cancer  . Asthma Sister   . Asthma Father    History   Social History  . Marital Status: Divorced    Spouse Name: N/A  . Number of Children: 1  . Years of Education: College   Occupational History  . truck driver     past  . disability    Social History Main Topics  . Smoking status: Former Smoker -- 1.50 packs/day for 23 years    Types: Cigars, Cigarettes    Quit date: 05/22/2010  . Smokeless tobacco: Never Used  . Alcohol Use: No  . Drug Use: No  . Sexual Activity: Not Currently    Birth Control/ Protection: Condom      Comment: pt. declined condoms   Other Topics Concern  . Not on file   Social History Narrative   Sister April stays with him currently although he hopes to return to truck driving. 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 Sister is starting medical school next year.   Patient is divorced and lives with his sister.   Patient has one adult son.   Patient is part-time, driving a truck.   Patient has a college education.   Patient is right-handed.   Patient drinks 2-3 cups of coffee and a 4-5 cups of tea.    Physical Exam: Filed Vitals:   11/19/14 1330  BP: 144/97  Pulse: 76  Temp: 98.3  Resp: 21  95% on room air.  Physical Exam  Constitutional: He is oriented to person, place, and time and well-developed, well-nourished, and in no distress. No distress.  HENT:  Head: Normocephalic and atraumatic.  Mouth/Throat: No oropharyngeal exudate.  Eyes: Conjunctivae and EOM are normal. Pupils are equal, round, and reactive to light. No scleral icterus.  Neck: Normal range of motion. Neck supple. No thyromegaly present.  Cardiovascular: Normal rate, regular rhythm and normal heart sounds.   Pulmonary/Chest: Effort normal. No respiratory distress. He has wheezes (expiratory, bilateral). He has no rales.  Abdominal: He exhibits distension. He exhibits no mass. There is tenderness.  There is no rebound and no guarding.  Subcutaneous nodule approximately 1 cm in diameter near right lateral laparoscopy scar, reduced bowel sounds.  Musculoskeletal: Normal range of motion. He exhibits no edema or tenderness.  Lymphadenopathy:    He has no cervical adenopathy.  Neurological: He is alert and oriented to person, place, and time. No cranial nerve deficit. He exhibits normal muscle tone.  Skin: Skin is warm and dry. He is not diaphoretic. No erythema.  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  Laparoscopy scars with Steri-Strips overlying, clean, dry, and intact.    Lab results: Basic Metabolic Panel:  Recent Labs  11/19/14 1120  NA 138  K 3.8  CL 106  CO2 24  GLUCOSE 94  BUN 6  CREATININE 1.23  CALCIUM 9.0   Liver Function Tests:  Recent Labs  11/19/14 1120  AST 16  ALT 13*  ALKPHOS 136*  BILITOT 0.6  PROT 7.2  ALBUMIN 3.5   CBC:  Recent Labs  11/19/14 1120  WBC 6.4  NEUTROABS 3.3  HGB 8.9*  HCT 29.4*  MCV 70.5*  PLT 319   Urine Drug Screen: Drugs of Abuse     Component Value Date/Time   LABOPIA NEG 10/05/2011 1045   LABOPIA POSITIVE* 10/10/2010 1502   COCAINSCRNUR NEG 10/05/2011 1045   COCAINSCRNUR NONE DETECTED 10/10/2010 1502   LABBENZ NEG 10/05/2011 1045   LABBENZ NONE DETECTED 10/10/2010 1502   AMPHETMU NEG 10/05/2011 1045   AMPHETMU NONE DETECTED 10/10/2010 1502   THCU NEG 10/05/2011 1045   THCU NONE DETECTED 10/10/2010 1502   LABBARB NEG 10/05/2011 1045   LABBARB  10/10/2010 1502    NONE DETECTED        DRUG SCREEN FOR MEDICAL PURPOSES ONLY.  IF CONFIRMATION IS NEEDED FOR ANY PURPOSE, NOTIFY LAB WITHIN 5 DAYS.        LOWEST DETECTABLE LIMITS FOR URINE DRUG SCREEN Drug Class       Cutoff (ng/mL) Amphetamine      1000 Barbiturate      200 Benzodiazepine   270 Tricyclics       350 Opiates          300 Cocaine          300 THC              50    Imaging results:  Dg Chest 2 View  11/19/2014   CLINICAL DATA:  Productive cough  EXAM: CHEST  2 VIEW  COMPARISON:  11/11/2014  FINDINGS: Cardiac shadow is within normal limits. Some increasing infiltrate is noted in the right lung base projecting in the right middle lobe on the lateral projection. No other focal infiltrate is seen. No bony abnormality is noted.  IMPRESSION: Early right middle lobe infiltrate.   Electronically Signed   By: Inez Catalina M.D.   On: 11/19/2014 12:38    Other results: EKG: Sinus rhythm, left atrial enlargement, early  repolarization-unchanged.  Assessment & Plan by Problem: Principal Problem:   HCAP (healthcare-associated pneumonia) Active Problems:   Human immunodeficiency virus (HIV) disease   Benign essential HTN   COPD GOLD III   GERD (gastroesophageal reflux disease)   Allergic rhinitis   #HCAP The patient has had persistent cough productive of sputum associated with fever and body aches. Chest x-ray consistent with pneumonia. Given his recent hospitalization, we will treat him for HCAP. He denies sick contacts, and he received a flu shot  this year, but his symptoms are consistent with a possible flu. HIV currently well controlled. Normal white blood cell count and lactic acid.  CURB-65 score of 0. -Admit to telemetry. -Continue aztreonam and vancomycin IV. -Continue normal saline at 100 mL per hour. -Continue Tussionex every 12 hours as needed. -Zofran 4 mg every 6 hours as needed. -Get blood cultures.  -Sputum culture and Gram stain. -Check strep and legionella antigens. -Check procalcitonin. -Check LDH. -Influenza panel and droplet precautions. -Heart healthy diet.  #Status post laparoscopic cholecystectomy Alkaline phosphatase still elevated postsurgery, but trending down since surgery. He has increased abdominal pain and distention along with nodule on abdominal exam of unclear etiology. His last bowel movement was a few days ago, which is likely contributing. However, he thinks he have a bowel movement on his own. -We will consult general surgery to evaluate the patient. -Consider imaging if worsening symptoms or no improvement. -Monitor for bowel movement, laxatives if necessary.  #Anemia MCV of 70.5, concerning for iron deficiency anemia. Denies any recent bleeding. The patient's hemoglobin appears to be at his baseline. Anemia panel from 06/01/2013 was consistent with iron deficiency anemia. Colonoscopy in January 2016 demonstrated moderate internal hemorrhoids, but was otherwise  normal. -Check anemia panel tomorrow morning. -Consider starting iron supplementation.  #Asthma/COPD/allergies The patient does have some bilateral wheezing on exam, likely triggered by pneumonia. No pulmonary function tests currently in Epic. -Continue home albuterol inhaler 2 puffs every 4 hours as needed. -Continue home ipratropium nebulizer 3 times a day when necessary. -DuoNeb's every 4 hours. -Continue home Dulera 2 puffs twice a day. -Hold home beclomethasone nasal spray for now. -Continue home Claritin 10 mg daily. -Continue home hydroxyzine 25 mg every 6 hours as needed.  #HIV Well-controlled on current regimen. Follows with Dr. [name]. -Check HIV viral load and CD4 count. -Continue home Norvir, Prezista, Isentress, and Truvada.  #Prostate cancer status post prostatectomy Patient reports his cancer is under control currently. He does not have any symptoms after his surgery. -No further workup necessary.  #History of atypical chest pain He currently denies chest pain. EKG with repolarization abnormality but otherwise unremarkable. -Continue home aspirin 81 mg daily.  -Check one troponin.  #Neuropathy -Continue home gabapentin 300 mg 4 times daily when necessary.  #GERD -Continue home PPI.  #Insomnia -Continue home trazodone 50 mg 2 at bedtime when necessary. -Continue home Ambien at bedtime when necessary.  #DVT prophylaxis -Lovenox.  Dispo: Disposition is deferred at this time, awaiting improvement of current medical problems. Anticipated discharge in approximately 1-4 day(s).   The patient does have a current PCP (Carly Montey Hora, MD), therefore will be require OPC follow-up after discharge.   The patient does not have transportation limitations that hinder transportation to clinic appointments.   Signed:  Arman Filter, MD, PhD PGY-1 Internal Medicine Teaching Service Pager: [phone] 11/19/2014, 2:58 PM

## 2014-11-19 NOTE — Telephone Encounter (Signed)
Gave patient Dr. Gwynn Burly response.

## 2014-11-19 NOTE — Progress Notes (Signed)
Patient ID: Casey Brennan, male   DOB: 10/25/1960, 54 y.o.   MRN: [id number]     [facility]      [address] [place], [place] [address]    Phone: [phone] FAX: [phone]     Subjective: Casey Brennan is a 54 year old male with a history of HIV, asthma, PE, GERD who is POD#13 laparoscopic cholecystectomy by Dr. Hulen Skains.  He presents today with a persistent productive cough.  He was found to have pneumonia.  We have been asked to evaluate "lumps" under his dressing.  The patient reports ongoing nausea and vomiting.  He is taking zofran about 2 times per day.  Last BM was about 2-3 days ago, passing flatus.  Reports fevers of 102.6, chills and sweats.  He is taking in POs.    His work up shows a normal white count.  Normal LFTs.    Objective:  Vital signs:  Filed Vitals:   11/19/14 1400 11/19/14 1430 11/19/14 1445 11/19/14 1500  BP: 159/97 162/101 154/97 144/94  Pulse: 91 84 84 77  Temp:      TempSrc:      Resp: 20 19 15 19   SpO2: 95% 95% 95% 94%       Intake/Output   Yesterday:    This shift:     Physical Exam: General: Pt awake/alert/oriented x4 in no acute distress Chest: coarse BLL. No chest wall pain w good excursion CV:  Pulses intact.  Regular rhythm MS: Normal AROM mjr joints.  No obvious deformity Abdomen: Soft.  distended.  Incisions are healing well, no erythema or drainage, steri strips in place, I removed the dressing.  He has a tiny palpable hematoma, no abscess, rectus diastasis, but no hernias.  No evidence of peritonitis.  No incarcerated hernias.    Problem List:   Principal Problem:   HCAP (healthcare-associated pneumonia) Active Problems:   Human immunodeficiency virus (HIV) disease   Benign essential HTN   COPD GOLD III   GERD (gastroesophageal reflux disease)   Allergic rhinitis    Results:   Labs: Results for orders placed or performed during the hospital encounter of 11/19/14  (from the past 48 hour(s))  CBC with Differential     Status: Abnormal   Collection Time: 11/19/14 11:20 AM  Result Value Ref Range   WBC 6.4 4.0 - 10.5 K/uL   RBC 4.17 (L) 4.22 - 5.81 MIL/uL   Hemoglobin 8.9 (L) 13.0 - 17.0 g/dL   HCT 29.4 (L) 39.0 - 52.0 %   MCV 70.5 (L) 78.0 - 100.0 fL   MCH 21.3 (L) 26.0 - 34.0 pg   MCHC 30.3 30.0 - 36.0 g/dL   RDW 21.9 (H) 11.5 - 15.5 %   Platelets 319 150 - 400 K/uL   Neutrophils Relative % 52 43 - 77 %   Lymphocytes Relative 33 12 - 46 %   Monocytes Relative 8 3 - 12 %   Eosinophils Relative 6 (H) 0 - 5 %   Basophils Relative 1 0 - 1 %   Neutro Abs 3.3 1.7 - 7.7 K/uL   Lymphs Abs 2.1 0.7 - 4.0 K/uL   Monocytes Absolute 0.5 0.1 - 1.0 K/uL   Eosinophils Absolute 0.4 0.0 - 0.7 K/uL   Basophils Absolute 0.1 0.0 - 0.1 K/uL   RBC Morphology POLYCHROMASIA PRESENT     Comment: SPHEROCYTES TARGET CELLS   Comprehensive metabolic panel     Status: Abnormal  Collection Time: 11/19/14 11:20 AM  Result Value Ref Range   Sodium 138 135 - 145 mmol/L   Potassium 3.8 3.5 - 5.1 mmol/L   Chloride 106 101 - 111 mmol/L   CO2 24 22 - 32 mmol/L   Glucose, Bld 94 65 - 99 mg/dL   BUN 6 6 - 20 mg/dL   Creatinine, Ser 1.23 0.61 - 1.24 mg/dL   Calcium 9.0 8.9 - 10.3 mg/dL   Total Protein 7.2 6.5 - 8.1 g/dL   Albumin 3.5 3.5 - 5.0 g/dL   AST 16 15 - 41 U/L   ALT 13 (L) 17 - 63 U/L   Alkaline Phosphatase 136 (H) 38 - 126 U/L   Total Bilirubin 0.6 0.3 - 1.2 mg/dL   GFR calc non Af Amer >60 >60 mL/min   GFR calc Af Amer >60 >60 mL/min    Comment: (NOTE) The eGFR has been calculated using the CKD EPI equation. This calculation has not been validated in all clinical situations. eGFR's persistently <60 mL/min signify possible Chronic Kidney Disease.    Anion gap 8 5 - 15  I-Stat CG4 Lactic Acid, ED     Status: None   Collection Time: 11/19/14 11:34 AM  Result Value Ref Range   Lactic Acid, Venous 0.72 0.5 - 2.0 mmol/L  Lactate dehydrogenase     Status:  None   Collection Time: 11/19/14  1:32 PM  Result Value Ref Range   LDH 157 98 - 192 U/L  I-Stat CG4 Lactic Acid, ED     Status: None   Collection Time: 11/19/14  2:09 PM  Result Value Ref Range   Lactic Acid, Venous 0.80 0.5 - 2.0 mmol/L    Imaging / Studies: Dg Chest 2 View  11/19/2014   CLINICAL DATA:  Productive cough  EXAM: CHEST  2 VIEW  COMPARISON:  11/11/2014  FINDINGS: Cardiac shadow is within normal limits. Some increasing infiltrate is noted in the right lung base projecting in the right middle lobe on the lateral projection. No other focal infiltrate is seen. No bony abnormality is noted.  IMPRESSION: Early right middle lobe infiltrate.   Electronically Signed   By: Inez Catalina M.D.   On: 11/19/2014 12:38    Medications / Allergies:  Scheduled Meds:  Continuous Infusions: . sodium chloride 100 mL/hr at 11/19/14 1514  . aztreonam    . [START ON 11/20/2014] vancomycin    . vancomycin 1,750 mg (11/19/14 1359)   PRN Meds:.ipratropium  Antibiotics: Anti-infectives    Start     Dose/Rate Route Frequency Ordered Stop   11/20/14 0200  vancomycin (VANCOCIN) 1,250 mg in sodium chloride 0.9 % 250 mL IVPB     1,250 mg 166.7 mL/hr over 90 Minutes Intravenous Every 12 hours 11/19/14 1314     11/19/14 2200  aztreonam (AZACTAM) 2 g in dextrose 5 % 50 mL IVPB     2 g 100 mL/hr over 30 Minutes Intravenous 3 times per day 11/19/14 1414     11/19/14 1400  vancomycin (VANCOCIN) 1,750 mg in sodium chloride 0.9 % 500 mL IVPB     1,750 mg 250 mL/hr over 120 Minutes Intravenous  Once 11/19/14 1314     11/19/14 1300  aztreonam (AZACTAM) 2 g in dextrose 5 % 50 mL IVPB     2 g 100 mL/hr over 30 Minutes Intravenous  Once 11/19/14 1252 11/19/14 1344        Assessment/Plan POD#13 laparoscopic cholecystectomy with IOC--Dr. Hulen Skains Low suspicion for  an intra-abdominal abscess or a bile leak.  He does not have a hernia and his incisions   He may have a mild ileus.  Will obtain a plain AXR.   Would not recommend further radiologic imaging of his abdomen at this time.  Will follow for now.  Erby Pian, ANP-BC [facility]   11/19/2014 3:46 PM

## 2014-11-19 NOTE — ED Notes (Signed)
Admitting at bedside 

## 2014-11-20 DIAGNOSIS — J189 Pneumonia, unspecified organism: Secondary | ICD-10-CM

## 2014-11-20 DIAGNOSIS — Z9079 Acquired absence of other genital organ(s): Secondary | ICD-10-CM

## 2014-11-20 DIAGNOSIS — K219 Gastro-esophageal reflux disease without esophagitis: Secondary | ICD-10-CM

## 2014-11-20 DIAGNOSIS — Z79899 Other long term (current) drug therapy: Secondary | ICD-10-CM

## 2014-11-20 DIAGNOSIS — J209 Acute bronchitis, unspecified: Secondary | ICD-10-CM

## 2014-11-20 DIAGNOSIS — G4700 Insomnia, unspecified: Secondary | ICD-10-CM

## 2014-11-20 DIAGNOSIS — Z7951 Long term (current) use of inhaled steroids: Secondary | ICD-10-CM

## 2014-11-20 DIAGNOSIS — Z8546 Personal history of malignant neoplasm of prostate: Secondary | ICD-10-CM

## 2014-11-20 DIAGNOSIS — J45909 Unspecified asthma, uncomplicated: Secondary | ICD-10-CM

## 2014-11-20 DIAGNOSIS — Z9049 Acquired absence of other specified parts of digestive tract: Secondary | ICD-10-CM

## 2014-11-20 DIAGNOSIS — D649 Anemia, unspecified: Secondary | ICD-10-CM

## 2014-11-20 DIAGNOSIS — Z87891 Personal history of nicotine dependence: Secondary | ICD-10-CM

## 2014-11-20 DIAGNOSIS — G629 Polyneuropathy, unspecified: Secondary | ICD-10-CM

## 2014-11-20 DIAGNOSIS — Z21 Asymptomatic human immunodeficiency virus [HIV] infection status: Secondary | ICD-10-CM

## 2014-11-20 LAB — CBC WITH DIFFERENTIAL/PLATELET
Basophils Absolute: 0.1 10*3/uL (ref 0.0–0.1)
Basophils Relative: 1 % (ref 0–1)
Eosinophils Absolute: 0.4 10*3/uL (ref 0.0–0.7)
Eosinophils Relative: 7 % — ABNORMAL HIGH (ref 0–5)
HCT: 27.5 % — ABNORMAL LOW (ref 39.0–52.0)
Hemoglobin: 8.2 g/dL — ABNORMAL LOW (ref 13.0–17.0)
Lymphocytes Relative: 42 % (ref 12–46)
Lymphs Abs: 2.3 10*3/uL (ref 0.7–4.0)
MCH: 21.1 pg — ABNORMAL LOW (ref 26.0–34.0)
MCHC: 29.8 g/dL — ABNORMAL LOW (ref 30.0–36.0)
MCV: 70.7 fL — ABNORMAL LOW (ref 78.0–100.0)
Monocytes Absolute: 0.3 10*3/uL (ref 0.1–1.0)
Monocytes Relative: 6 % (ref 3–12)
Neutro Abs: 2.4 10*3/uL (ref 1.7–7.7)
Neutrophils Relative %: 44 % (ref 43–77)
Platelets: 268 10*3/uL (ref 150–400)
RBC: 3.89 MIL/uL — ABNORMAL LOW (ref 4.22–5.81)
RDW: 22.1 % — ABNORMAL HIGH (ref 11.5–15.5)
WBC: 5.5 10*3/uL (ref 4.0–10.5)

## 2014-11-20 LAB — BASIC METABOLIC PANEL
Anion gap: 6 (ref 5–15)
BUN: 8 mg/dL (ref 6–20)
CO2: 25 mmol/L (ref 22–32)
Calcium: 8.6 mg/dL — ABNORMAL LOW (ref 8.9–10.3)
Chloride: 106 mmol/L (ref 101–111)
Creatinine, Ser: 1.3 mg/dL — ABNORMAL HIGH (ref 0.61–1.24)
GFR calc Af Amer: >60 mL/min (ref >60)
GFR calc non Af Amer: >60 mL/min (ref >60)
Glucose, Bld: 105 mg/dL — ABNORMAL HIGH (ref 65–99)
Potassium: 3.7 mmol/L (ref 3.5–5.1)
Sodium: 137 mmol/L (ref 135–145)

## 2014-11-20 LAB — INFLUENZA PANEL BY PCR (TYPE A & B)
H1N1 flu by pcr: NOT DETECTED
Influenza A By PCR: NEGATIVE
Influenza B By PCR: NEGATIVE

## 2014-11-20 LAB — PROCALCITONIN: Procalcitonin: <0.1 ng/mL

## 2014-11-20 LAB — MAGNESIUM: Magnesium: 1.8 mg/dL (ref 1.7–2.4)

## 2014-11-20 LAB — T-HELPER CELLS (CD4) COUNT (NOT AT ARMC)
CD4 % Helper T Cell: 18 % — ABNORMAL LOW (ref 33–55)
CD4 T Cell Abs: 370 /uL — ABNORMAL LOW (ref 400–2700)

## 2014-11-20 LAB — LEGIONELLA ANTIGEN, URINE

## 2014-11-20 MED ORDER — TIZANIDINE HCL 4 MG PO TABS
6.0000 mg | ORAL_TABLET | Freq: Three times a day (TID) | ORAL | Status: DC | PRN
  Administered 2014-11-20 (×2): 6 mg via ORAL
  Filled 2014-11-20 (×3): qty 2

## 2014-11-20 MED ORDER — OXYCODONE-ACETAMINOPHEN 5-325 MG PO TABS
1.0000 | ORAL_TABLET | ORAL | Status: DC | PRN
Start: 2014-11-20 — End: 2014-11-21
  Administered 2014-11-20 – 2014-11-21 (×5): 1 via ORAL
  Filled 2014-11-20 (×5): qty 1

## 2014-11-20 MED ORDER — LEVOFLOXACIN 750 MG PO TABS
750.0000 mg | ORAL_TABLET | Freq: Every day | ORAL | Status: DC
  Administered 2014-11-20: 750 mg via ORAL
  Filled 2014-11-20 (×2): qty 1

## 2014-11-20 MED ORDER — DOCUSATE SODIUM 100 MG PO CAPS: 100.0000 mg | ORAL_CAPSULE | Freq: Two times a day (BID) | ORAL | Status: DC | PRN

## 2014-11-20 NOTE — Progress Notes (Signed)
Subjective:    Currently, the patient reports feeling significantly better with improvement of his breathing, cough, and abdominal pain. He reports having 2 bowel movements last night.  Interval Events: -Seen by general surgery who has low suspicion for surgical complication. Ordered abdominal x-ray. -Abdominal x-ray unremarkable.  -Streptococcus pneumonia antigen negative. -Procalcitonin <0.01. -LDH 157.    Objective:    Vital Signs:   Temp:  [98.3 F (36.8 C)-99.1 F (37.3 C)] 98.4 F (36.9 C) (05/20 1004) Pulse Rate:  [76-95] 86 (05/20 1004) Resp:  [15-23] 18 (05/20 1004) BP: (126-162)/(67-101) 126/67 mmHg (05/20 1004) SpO2:  [92 %-96 %] 94 % (05/20 1004) Weight:  [212 lb 11.2 oz (96.48 kg)] 212 lb 11.2 oz (96.48 kg) (05/19 2149) Last BM Date: 11/16/14  24-hour weight change: Weight change:   Intake/Output:   Intake/Output Summary (Last 24 hours) at 11/20/14 1106 Last data filed at 11/20/14 0900  Gross per 24 hour  Intake 3376.67 ml  Output    600 ml  Net 2776.67 ml      Physical Exam: General: Well-developed, well-nourished, in no acute distress; alert, appropriate and cooperative throughout examination.  Lungs:  Upper airway sounds present bilaterally, no wheezing or rales.  Moving air well.  Heart: RRR. S1 and S2 normal without gallop, murmur, or rubs.  Abdomen:  BS normoactive.  Mild distension.  Surgical scars present, no evidence of infection.  Extremities: No pretibial edema.     Labs:  Basic Metabolic Panel:  Recent Labs Lab 11/19/14 1120 11/20/14 0515  NA 138 137  K 3.8 3.7  CL 106 106  CO2 24 25  GLUCOSE 94 105*  BUN 6 8  CREATININE 1.23 1.30*  CALCIUM 9.0 8.6*  MG  --  1.8    Liver Function Tests:  Recent Labs Lab 11/19/14 1120  AST 16  ALT 13*  ALKPHOS 136*  BILITOT 0.6  PROT 7.2  ALBUMIN 3.5   CBC:  Recent Labs Lab 11/19/14 1120 11/20/14 0515  WBC 6.4 5.5  NEUTROABS 3.3 2.4  HGB 8.9* 8.2*  HCT 29.4* 27.5*    MCV 70.5* 70.7*  PLT 319 268    Cardiac Enzymes:  Recent Labs Lab 11/19/14 1729  TROPONINI <0.03   Microbiology: Results for orders placed or performed during the hospital encounter of 11/19/14  Culture, expectorated sputum-assessment     Status: None   Collection Time: 11/19/14  3:53 PM  Result Value Ref Range Status   Specimen Description SPUTUM  Final   Special Requests Immunocompromised  Final   Sputum evaluation   Final    THIS SPECIMEN IS ACCEPTABLE. RESPIRATORY CULTURE REPORT TO FOLLOW.   Report Status 11/19/2014 FINAL  Final  Culture, respiratory (NON-Expectorated)     Status: None (Preliminary result)   Collection Time: 11/19/14  3:53 PM  Result Value Ref Range Status   Specimen Description SPUTUM  Final   Special Requests NONE  Final   Gram Stain   Final    MODERATE WBC PRESENT,BOTH PMN AND MONONUCLEAR RARE SQUAMOUS EPITHELIAL CELLS PRESENT NO ORGANISMS SEEN Performed at [facility]    Culture   Final    NO GROWTH 1 DAY Performed at [facility]    Report Status PENDING  Incomplete   Imaging: Dg Chest 2 View  11/19/2014   CLINICAL DATA:  Productive cough  EXAM: CHEST  2 VIEW  COMPARISON:  11/11/2014  FINDINGS: Cardiac shadow is within normal limits. Some increasing infiltrate is noted in the right  lung base projecting in the right middle lobe on the lateral projection. No other focal infiltrate is seen. No bony abnormality is noted.  IMPRESSION: Early right middle lobe infiltrate.   Electronically Signed   By: Inez Catalina M.D.   On: 11/19/2014 12:38   Dg Abd Portable 1v  11/19/2014   CLINICAL DATA:  Nausea and vomiting  EXAM: PORTABLE ABDOMEN - 1 VIEW  COMPARISON:  None.  FINDINGS: Scattered large and small bowel gas is noted. No abnormal mass or abnormal calcifications are seen. Changes consistent with prior cholecystectomy are noted. No acute bony abnormality is noted.  IMPRESSION: No acute abnormality seen.   Electronically Signed   By: Inez Catalina M.D.   On: 11/19/2014 16:31       Medications:    Infusions: . sodium chloride 100 mL/hr at 11/19/14 2109    Scheduled Medications: . [START ON 11/23/2014] aspirin EC  81 mg Oral Once per day on Mon Tue Wed  . budesonide (PULMICORT) nebulizer solution  0.25 mg Nebulization BID  . Darunavir Ethanolate  800 mg Oral Q supper  . dextromethorphan  30 mg Oral BID  . emtricitabine-tenofovir  1 tablet Oral QHS  . enoxaparin (LOVENOX) injection  40 mg Subcutaneous Q24H  . ipratropium-albuterol  3 mL Nebulization TID  . levofloxacin  750 mg Oral Daily  . loratadine  10 mg Oral Daily  . mometasone-formoterol  2 puff Inhalation BID  . multivitamin with minerals  1 tablet Oral QHS  . ondansetron  4 mg Oral Q6H  . pantoprazole  40 mg Oral Daily  . raltegravir  800 mg Oral QHS  . ritonavir  100 mg Oral Q supper    PRN Medications: albuterol, chlorpheniramine-HYDROcodone, docusate sodium, gabapentin, hydrOXYzine, ipratropium, ondansetron (ZOFRAN) IV, oxyCODONE-acetaminophen, tiZANidine, traZODone, zolpidem   Assessment/ Plan:    Principal Problem:   HCAP (healthcare-associated pneumonia) Active Problems:   Human immunodeficiency virus (HIV) disease   Benign essential HTN   COPD GOLD III   GERD (gastroesophageal reflux disease)   Allergic rhinitis  #HCAP The patient has remained afebrile overnight, and his shortness of breath is significantly improved. No organisms seen on sputum culture, and procalcitonin suggestive of viral pneumonia. Influenza negative. Normal LDH, and low suspicion for PCP given his good HIV control. I would expect his symptoms to be significantly worse to have more systemic signs of infection if this were a staph aureus pneumonia. Given his good clinical response, we will switch him to oral antibiotics today and consider possible discharge this afternoon. -Discontinue telemetry. -Switch from aztreonam and vancomycin to Levaquin by mouth, day 2. -Stop IV  fluids. -Continue Tussionex every 12 hours as needed. -Continue Zofran 4 mg every 6 hours as needed. -Continue home Zanaflex. -Follow-up blood cultures and sputum culture. -Follow-up legionella antigen. -Regular diet. -Possible discharge this afternoon versus tomorrow morning.  #Status post laparoscopic cholecystectomy Abdominal pain is improved after having a bowel movement. General surgery is not concern for a postoperative complication. Abdominal x-ray unremarkable. -Appreciate general surgery recommendations. -Continue Percocet 5-325 mg every 4 hours when necessary. -Start Colace twice a day when necessary for constipation.  #Iron deficiency anemia He has previously refused starting iron supplementation. He may have underlying thalassemia or other concerning factors well. We would need to treat with iron first to see if this corrects his anemia. -Consider starting iron supplementation at discharge.  #Asthma/COPD/allergies Wheezing significantly improved. -Continue home albuterol inhaler 2 puffs every 4 hours as needed. -Continue home ipratropium nebulizer  3 times a day when necessary. -Continue DuoNeb's every 4 hours. -Continue home Dulera 2 puffs twice a day. -Continue home beclomethasone nasal spray. -Continue home Claritin 10 mg daily. -Continue home hydroxyzine 25 mg every 6 hours as needed. -Pulmonary function tests as an outpatient.  #HIV -Follow-up HIV viral load and CD4 count. -Continue home Norvir, Prezista, Isentress, and Truvada.  #History of atypical chest pain Troponins negative overnight. -Continue home aspirin 81 mg daily.   #Neuropathy -Continue home gabapentin 300 mg 4 times daily when necessary.  #GERD -Continue home PPI.  #Insomnia -Continue home trazodone 50 mg 2 at bedtime when necessary. -Continue home Ambien at bedtime when necessary.   DVT PPX - low molecular weight heparin  CODE STATUS - Full.  CONSULTS PLACED - General  surgery.  DISPO - Discharge this afternoon or tomorrow morning.  The patient does have a current PCP (Rivet, Carly, MD) and does need an [facility] hospital follow-up appointment after discharge.    Is the [facility] hospital follow-up appointment a one-time only appointment? no.  Does the patient have transportation limitations that hinder transportation to clinic appointments? no   SERVICE NEEDED AT [facility] [place]         Y = Yes, Blank = No PT:   OT:   RN:   Equipment:   Other:      Length of Stay: 1 day(s)   Signed: Charlesetta Shanks, MD  PGY-1, Internal Medicine Resident Pager: [phone] (7AM-5PM) 11/20/2014, 11:06 AM

## 2014-11-20 NOTE — Progress Notes (Signed)
Pt c/o feeling flushed, temperature checked 99.1, still with abdominal pain, unrelieved by PRN pain medicine given at 2010. Paged and notified Internal Medicine Teaching Service on call MD. Dr. Ethelene Hal at bedside now with new orders, acknowledged and awaiting for PRN medicines from pharmacy.

## 2014-11-21 DIAGNOSIS — Y95 Nosocomial condition: Secondary | ICD-10-CM

## 2014-11-21 MED ORDER — LEVOFLOXACIN 750 MG PO TABS: 750.0000 mg | ORAL_TABLET | Freq: Every day | ORAL | Status: AC

## 2014-11-21 MED ORDER — HYDROCOD POLST-CPM POLST ER 10-8 MG/5ML PO SUER: 5.0000 mL | Freq: Two times a day (BID) | ORAL | Status: DC | PRN

## 2014-11-21 MED ORDER — BISACODYL 5 MG PO TBEC
5.0000 mg | DELAYED_RELEASE_TABLET | Freq: Every day | ORAL | Status: DC | PRN
  Administered 2014-11-21: 5 mg via ORAL
  Filled 2014-11-21: qty 1

## 2014-11-21 MED ORDER — FERROUS SULFATE 325 (65 FE) MG PO TABS
325.0000 mg | ORAL_TABLET | Freq: Every day | ORAL | Status: DC
  Administered 2014-11-21: 325 mg via ORAL
  Filled 2014-11-21 (×2): qty 1

## 2014-11-21 MED ORDER — DEXTROMETHORPHAN POLISTIREX ER 30 MG/5ML PO SUER: 30.0000 mg | Freq: Two times a day (BID) | ORAL | Status: DC | PRN

## 2014-11-21 MED ORDER — BISACODYL 5 MG PO TBEC: 5.0000 mg | DELAYED_RELEASE_TABLET | Freq: Every day | ORAL | Status: DC | PRN

## 2014-11-21 MED ORDER — OXYCODONE-ACETAMINOPHEN 5-325 MG PO TABS: 1.0000 | ORAL_TABLET | Freq: Four times a day (QID) | ORAL | Status: DC | PRN

## 2014-11-21 MED ORDER — FERROUS SULFATE 325 (65 FE) MG PO TABS
325.0000 mg | ORAL_TABLET | Freq: Every day | ORAL | Status: DC
Start: 2014-11-21 — End: 2015-10-25

## 2014-11-21 NOTE — Progress Notes (Signed)
Pt upset with diagnosis on discharge and wants to speak with Internal medicine before they leave to get this removed from his hospital record.  Paged Internal medicine and they will speak with family before discharge.

## 2014-11-21 NOTE — Discharge Instructions (Signed)
·   Thank you for allowing [place] to be involved in your healthcare while you were hospitalized at [facility].   Please note that there have been changes to your home medications.  --> PLEASE LOOK AT YOUR DISCHARGE MEDICATION LIST FOR DETAILS.   Please call your PCP if you have any questions or concerns, or any difficulty getting any of your medications.  Please return to the ER if you have worsening of your symptoms or new severe symptoms arise  You will need to continue to take your Levaquin for an additional 4 days, starting tomorrow.  Make sure to follow up with surgery and your PCP this week.  We started you on iron supplementation once per day to help with your anemia.  I also prescribed a laxative that you can take as needed to help with constipation.

## 2014-11-21 NOTE — Progress Notes (Signed)
11/21/2014 10:00 AM  Spoke with patient and family member (sister April) regarding care concerns and assisted in service recovery again.  Time Spent: 20 minutes  Cerria Randhawa [employer], RN-BC, RN3 [facility]  Phone [phone]  Activate Charge Nurse during this encounter

## 2014-11-21 NOTE — Progress Notes (Addendum)
11/20/2014 7:20 PM   Spoke with patient and family member (sister April) regarding care concerns and assisted in service recovery.  Time Spent: 45 minutes  Deondra Labrador [employer], RN-BC, RN3 [facility]  Phone [phone]  Activate Charge Nurse during this encounter  (Late Entry)

## 2014-11-21 NOTE — Progress Notes (Signed)
Pt discharge instructions and prescriptions given, pt verbalized understanding. VSS. Denies pain. All questions answered to patient satifaction by internal medicine.

## 2014-11-21 NOTE — Progress Notes (Signed)
Called by RN that patient and sister are upset with discharge diagnosis.  Notebly the diagnosis of HTN, GERD, and COPD.  I went and met with the patient and his sister with Dr. Trudee Kuster, we discussed that his pulmonary Doctor added the diagnosis of COPD based on spirometry that was obtained in January and that he was prescribed multiple medications for this, and he also added the suspected diagnosis of GERD and that was why he was prescribed Omeprazole.  He then got further irritated by the HTN diagnosis, reporting that he met with the president of the hospital to get the diagnosis removed the last time it was added and that he has never taken BP medications. I informed Casey Brennan that I have reviewed his records and he has been hypertensive on a number of visits to the hospital and clinics over a long span of time, he was even hypertensive on admission to the hospital this time.  His BP is currently normal and he is not on BP medications.  I do feel that the HTN diagnosis is appropriate at least for a history of HTN and that as doctors this is important for [place] to keep track of.  I informed him that I would change his diagnosis to history of HTN since he is not currently on medications.  He was upset with this and reported he will be meeting with the President tomrorrow.  I will notify my attending Dr. Beryle Beams.  Casey Groves, DO

## 2014-11-21 NOTE — Progress Notes (Signed)
Patient ID: Casey Brennan, male   DOB: 10-20-1960, 55 y.o.   MRN: [id number] Medicine attending discharge note: I attest to the accuracy of the discharge evaluation and plan as recorded by resident physician Dr. Karle Starch Moding.  Clinical summary: 54 year old man, HIV positive on quadruple therapy, 2 years status post prostatectomy for prostate cancer. He was recently admitted here on May 6 and underwent uncomplicated laparoscopic cholecystectomy. He presented on 11/19/14 with a productive cough which he began to notice the day after hospital discharge. He has had generalized myalgias and headache. He reported fevers as high as 102 at home. Since hospital discharge he has had an interim visit to the emergency department on May 11 with complaints of cough, fever, intermittent left pleuritic chest discomfort, and dyspnea. CT angiogram of the chest, suboptimal study, showed no gross evidence for pulmonary emboli or infiltrates. Azithromycin was prescribed but that prescription was not filled. He returned to the emergency department on the day of the current admission May 19, with worsening symptoms. He was afebrile but a chest radiograph read as showing an early right middle lobe infiltrate and admission was requested by the ED physician.  Initial exam by Dr. Trudee Kuster: BP: 144/97  Pulse: 76  Temp: 98.3  Resp: 21  95% on room air.    No respiratory distress. No erythema or exudate in the oropharynx. No cardiac murmurs. Bilateral expiratory wheezes over the lung fields. Residual hyperpigmented macules on the abdomen from previous folliculitis. Healing laparoscopy scars from recent cholecystectomy.     Hospital course: He did receive initial parenteral antibiotics in the emergency department. Radiologist read an early right middle lobe infiltrate on a two-view chest film but findings were very subtle in my opinion. He had no fever during this hospitalization. No leukocytosis. No organisms  seen on Gram stain of the sputum and no growth of bacteria after 24 hours. Legionella urinary antigen negative. We felt comfortable transitioning him to oral Levaquin. After an additional 24 hours of observation he remained stable.  Condition at discharge is stable. There were no complications. He will follow-up in our internal medicine clinic.

## 2014-11-21 NOTE — Progress Notes (Signed)
Subjective:    Currently, the patient continues to feel better.  He had a little sweating last night, but this is improved from prior.  His breathing and abdominal pain are improved.  Interval Events: -Afebrile with stable vital signs over last 24 hours.   Objective:    Vital Signs:   Temp:  [98.4 F (36.9 C)-98.7 F (37.1 C)] 98.7 F (37.1 C) (05/21 0500) Pulse Rate:  [86-95] 86 (05/21 0500) Resp:  [18] 18 (05/21 0500) BP: (126-134)/(67-90) 127/81 mmHg (05/21 0500) SpO2:  [93 %-95 %] 95 % (05/21 0500) Weight:  [210 lb 12.8 oz (95.618 kg)] 210 lb 12.8 oz (95.618 kg) (05/20 2100) Last BM Date: 11/16/14  24-hour weight change: Weight change: -1 lb 14.4 oz (-0.862 kg)  Intake/Output:   Intake/Output Summary (Last 24 hours) at 11/21/14 0827 Last data filed at 11/21/14 0500  Gross per 24 hour  Intake    720 ml  Output    650 ml  Net     70 ml      Physical Exam: General: Well-developed, well-nourished, in no acute distress; alert, appropriate and cooperative throughout examination.  Lungs:  Clear to auscultation bilaterally, no wheezing or rales.  Moving air well.  Heart: RRR. S1 and S2 normal without gallop, murmur, or rubs.  Abdomen:  BS normoactive, nondistended.  Surgical scars present, no evidence of infection.  Extremities: No pretibial edema.     Labs:  Basic Metabolic Panel:  Recent Labs Lab 11/19/14 1120 11/20/14 0515  NA 138 137  K 3.8 3.7  CL 106 106  CO2 24 25  GLUCOSE 94 105*  BUN 6 8  CREATININE 1.23 1.30*  CALCIUM 9.0 8.6*  MG  --  1.8    Liver Function Tests:  Recent Labs Lab 11/19/14 1120  AST 16  ALT 13*  ALKPHOS 136*  BILITOT 0.6  PROT 7.2  ALBUMIN 3.5   CBC:  Recent Labs Lab 11/19/14 1120 11/20/14 0515  WBC 6.4 5.5  NEUTROABS 3.3 2.4  HGB 8.9* 8.2*  HCT 29.4* 27.5*  MCV 70.5* 70.7*  PLT 319 268    Cardiac Enzymes:  Recent Labs Lab 11/19/14 1729  TROPONINI <0.03   Legionella Ag: Negative.  Lab Results    Component Value Date   CD4TABS 370* 11/19/2014   CD4TABS 490 04/14/2014   CD4TABS 430 10/13/2013   Microbiology: Results for orders placed or performed during the hospital encounter of 11/19/14  Culture, expectorated sputum-assessment     Status: None   Collection Time: 11/19/14  3:53 PM  Result Value Ref Range Status   Specimen Description SPUTUM  Final   Special Requests Immunocompromised  Final   Sputum evaluation   Final    THIS SPECIMEN IS ACCEPTABLE. RESPIRATORY CULTURE REPORT TO FOLLOW.   Report Status 11/19/2014 FINAL  Final  Culture, respiratory (NON-Expectorated)     Status: None (Preliminary result)   Collection Time: 11/19/14  3:53 PM  Result Value Ref Range Status   Specimen Description SPUTUM  Final   Special Requests NONE  Final   Gram Stain   Final    MODERATE WBC PRESENT,BOTH PMN AND MONONUCLEAR RARE SQUAMOUS EPITHELIAL CELLS PRESENT NO ORGANISMS SEEN Performed at [facility]    Culture   Final    NO GROWTH 1 DAY Performed at [facility]    Report Status PENDING  Incomplete   Imaging: Dg Chest 2 View  11/19/2014   CLINICAL DATA:  Productive cough  EXAM: CHEST  2 VIEW  COMPARISON:  11/11/2014  FINDINGS: Cardiac shadow is within normal limits. Some increasing infiltrate is noted in the right lung base projecting in the right middle lobe on the lateral projection. No other focal infiltrate is seen. No bony abnormality is noted.  IMPRESSION: Early right middle lobe infiltrate.   Electronically Signed   By: Inez Catalina M.D.   On: 11/19/2014 12:38   Dg Abd Portable 1v  11/19/2014   CLINICAL DATA:  Nausea and vomiting  EXAM: PORTABLE ABDOMEN - 1 VIEW  COMPARISON:  None.  FINDINGS: Scattered large and small bowel gas is noted. No abnormal mass or abnormal calcifications are seen. Changes consistent with prior cholecystectomy are noted. No acute bony abnormality is noted.  IMPRESSION: No acute abnormality seen.   Electronically Signed   By: Inez Catalina M.D.   On: 11/19/2014 16:31       Medications:    Infusions:    Scheduled Medications: . [START ON 11/23/2014] aspirin EC  81 mg Oral Once per day on Mon Tue Wed  . budesonide (PULMICORT) nebulizer solution  0.25 mg Nebulization BID  . Darunavir Ethanolate  800 mg Oral Q supper  . dextromethorphan  30 mg Oral BID  . emtricitabine-tenofovir  1 tablet Oral QHS  . enoxaparin (LOVENOX) injection  40 mg Subcutaneous Q24H  . ferrous sulfate  325 mg Oral Q breakfast  . ipratropium-albuterol  3 mL Nebulization TID  . levofloxacin  750 mg Oral Daily  . loratadine  10 mg Oral Daily  . mometasone-formoterol  2 puff Inhalation BID  . multivitamin with minerals  1 tablet Oral QHS  . ondansetron  4 mg Oral Q6H  . pantoprazole  40 mg Oral Daily  . raltegravir  800 mg Oral QHS  . ritonavir  100 mg Oral Q supper    PRN Medications: albuterol, bisacodyl, chlorpheniramine-HYDROcodone, docusate sodium, gabapentin, hydrOXYzine, ipratropium, ondansetron (ZOFRAN) IV, oxyCODONE-acetaminophen, tiZANidine, traZODone, zolpidem   Assessment/ Plan:    Principal Problem:   HCAP (healthcare-associated pneumonia) Active Problems:   Human immunodeficiency virus (HIV) disease   Benign essential HTN   COPD GOLD III   GERD (gastroesophageal reflux disease)   Allergic rhinitis  #HCAP Afebrile with improvement of his breathing and cough.  No growth on cultures.  Legionella negative. -Discontinue telemetry. -Continue Levaquin by mouth, day 3 of 7. -Continue Tussionex every 12 hours as needed. -Continue Zofran 4 mg every 6 hours as needed. -Continue home Zanaflex. -Follow-up blood cultures and sputum culture. -Regular diet. -Discharge home today.  #Status post laparoscopic cholecystectomy Abdominal pain improved. -Appreciate general surgery recommendations. -Continue Percocet 5-325 mg every 4 hours when necessary. -Continue bisacodyl daily PRN for constipation.  #Iron deficiency  anemia -Start ferrous sulfate 325 mg daily.  #Asthma/COPD/allergies Wheezing resolved with breathing treatments. -Continue home albuterol inhaler 2 puffs every 4 hours as needed. -Continue home ipratropium nebulizer 3 times a day when necessary. -Continue DuoNeb's every 4 hours. -Continue home Dulera 2 puffs twice a day. -Continue home beclomethasone nasal spray. -Continue home Claritin 10 mg daily. -Continue home hydroxyzine 25 mg every 6 hours as needed. -Pulmonary function tests as an outpatient.  #HIV CD 4 370. -Follow-up HIV viral load. -Continue home Norvir, Prezista, [place]ent[place], and Truvada.  #History of atypical chest pain -Continue home aspirin 81 mg daily.   #Neuropathy -Continue home gabapentin 300 mg 4 times daily when necessary.  #GERD -Continue home PPI.  #Insomnia -Continue home trazodone 50 mg 2 at bedtime when necessary. -Continue  home Ambien at bedtime when necessary.   DVT PPX - low molecular weight heparin  CODE STATUS - Full.  CONSULTS PLACED - General surgery.  DISPO - Discharge home today.  The patient does have a current PCP (Rivet, Carly, MD) and does need an [facility] hospital follow-up appointment after discharge.    Is the [facility] hospital follow-up appointment a one-time only appointment? no.  Does the patient have transportation limitations that hinder transportation to clinic appointments? no   SERVICE NEEDED AT [place]         Y = Yes, Blank = No PT:   OT:   RN:   Equipment:   Other:      Length of Stay: 2 day(s)   Signed: Charlesetta Shanks, MD  PGY-1, Internal Medicine Resident Pager: [phone] (7AM-5PM) 11/21/2014, 8:27 AM

## 2014-11-21 NOTE — Discharge Summary (Signed)
Name: Casey Brennan MRN: [id number] DOB: 09/06/1960 54 y.o. PCP: Juliet Rude, MD  Date of Admission: 11/19/2014 12:21 PM Date of Discharge: 11/21/2014 Attending Physician: Murriel Hopper, MD  Discharge Diagnosis: Principal Problem:   HCAP (healthcare-associated pneumonia) Active Problems:   Human immunodeficiency virus (HIV) disease   Hx of primary hypertension   COPD GOLD III   GERD (gastroesophageal reflux disease)   Allergic rhinitis  Discharge Medications:   Medication List    STOP taking these medications        HYDROcodone-acetaminophen 5-325 MG per tablet  Commonly known as:  NORCO/VICODIN      TAKE these medications        albuterol (2.5 MG/3ML) 0.083% nebulizer solution  Commonly known as:  PROVENTIL  Take 3 mLs (2.5 mg total) by nebulization every 6 (six) hours as needed for wheezing or shortness of breath.     albuterol 108 (90 BASE) MCG/ACT inhaler  Commonly known as:  PROVENTIL HFA;VENTOLIN HFA  Inhale 2 puffs into the lungs every 4 (four) hours as needed for wheezing or shortness of breath.     aspirin EC 81 MG tablet  Take 81 mg by mouth 3 (three) times a week. Monday, Tuesday and Wednesday     Beclomethasone Dipropionate 80 MCG/ACT Aers  Commonly known as:  QNASL  Place 1 spray into the nose 2 (two) times daily.     bisacodyl 5 MG EC tablet  Commonly known as:  DULCOLAX  Take 1 tablet (5 mg total) by mouth daily as needed for moderate constipation.     chlorpheniramine-HYDROcodone 10-8 MG/5ML Suer  Commonly known as:  TUSSIONEX  Take 5 mLs by mouth every 12 (twelve) hours as needed for cough.     dextromethorphan 30 MG/5ML liquid  Commonly known as:  DELSYM  Take 5 mLs (30 mg total) by mouth 2 (two) times daily as needed for cough.     ferrous sulfate 325 (65 FE) MG tablet  Take 1 tablet (325 mg total) by mouth daily with breakfast.     gabapentin 300 MG capsule  Commonly known as:  NEURONTIN  Take 1 capsule (300 mg total) by  mouth 4 (four) times daily as needed (nerve pain).     hydrOXYzine 25 MG tablet  Commonly known as:  ATARAX/VISTARIL  Take 1 tablet (25 mg total) by mouth every 6 (six) hours.     ipratropium 0.02 % nebulizer solution  Commonly known as:  ATROVENT  Take 2.5 mLs (0.5 mg total) by nebulization 3 (three) times daily as needed for wheezing or shortness of breath. Mix with xopenex     levofloxacin 750 MG tablet  Commonly known as:  LEVAQUIN  Take 1 tablet (750 mg total) by mouth daily.     loratadine 10 MG tablet  Commonly known as:  CLARITIN  Take 10 mg by mouth daily.     mometasone-formoterol 200-5 MCG/ACT Aero  Commonly known as:  DULERA  Take 2 puffs first thing in am and then another 2 puffs about 12 hours later.     multivitamin with minerals Tabs tablet  Take 1 tablet by mouth at bedtime.     mupirocin ointment 2 %  Commonly known as:  BACTROBAN  APPLY TO THE AFFECTED AREA TWICE DAILY FOR 2 WEEKS. PLACE COTTON BETWEEN TOES TO ALLOW BETTER AERATION     NORVIR 100 MG Tabs tablet  Generic drug:  ritonavir  TAKE 1 TABLET BY MOUTH EVERY DAY WITH PREZISTA AND  TRUVADA     omeprazole 40 MG capsule  Commonly known as:  PRILOSEC  Take 40 mg by mouth at bedtime.     ondansetron 4 MG tablet  Commonly known as:  ZOFRAN  Take 1 tablet (4 mg total) by mouth every 6 (six) hours.     oxyCODONE-acetaminophen 5-325 MG per tablet  Commonly known as:  PERCOCET/ROXICET  Take 1 tablet by mouth every 6 (six) hours as needed for severe pain.     PREZISTA 800 MG tablet  Generic drug:  Darunavir Ethanolate  TAKE 1 TABLET BY MOUTH EVERY DAY     raltegravir 400 MG tablet  Commonly known as:  ISENTRESS  Take 800 mg by mouth at bedtime.     REFRESH OP  Place 1 drop into both eyes at bedtime.     tizanidine 6 MG capsule  Commonly known as:  ZANAFLEX  Take 1 capsule (6 mg total) by mouth 3 (three) times daily as needed for muscle spasms.     traZODone 50 MG tablet  Commonly known as:   DESYREL  TAKE 1 TABLET BY MOUTH AT BEDTIME     TRUVADA 200-300 MG per tablet  Generic drug:  emtricitabine-tenofovir  TAKE 1 TABLET BY MOUTH EVERY DAY WITH PREZISTA AND NORVIR     VOLTAREN 1 % Gel  Generic drug:  diclofenac sodium  APPLY TO THE AFFECTED AREA TWICE DAILY AS NEEDED     zolpidem 12.5 MG CR tablet  Commonly known as:  AMBIEN CR  Take 1 tablet (12.5 mg total) by mouth at bedtime as needed for sleep.        Disposition and follow-up:   Casey Brennan was discharged from [facility] in Stable condition.  At the hospital follow up visit please address:  1.  Completion of antibiotics, follow up with general surgery.  2.  Labs / imaging needed at time of follow-up: None.  3.  Pending labs/ test needing follow-up: Or culture, no growth to date, HIV RNA.  Follow-up Appointments: Follow-up Information    Follow up with Albin Felling, MD On 11/24/2014.   Specialty:  Internal Medicine   Why:  2:15 pm   Contact information:   [place] [address] [phone]      Discharge Instructions: Thank you for allowing [place] to be involved in your healthcare while you were hospitalized at [facility].  Please note that there have been changes to your home medications. --> PLEASE LOOK AT YOUR DISCHARGE MEDICATION LIST FOR DETAILS. Please call your PCP if you have any questions or concerns, or any difficulty getting any of your medications.  Please return to the ER if you have worsening of your symptoms or new severe symptoms arise  You will need to continue to take your Levaquin for an additional 4 days, starting tomorrow.  Make sure to follow up with surgery and your PCP this week.  We started you on iron supplementation once per day to help with your anemia.  I also prescribed a laxative that you can take as needed to help with constipation. Discharge Instructions    Call MD for:  difficulty breathing, headache or visual  disturbances    Complete by:  As directed      Call MD for:  extreme fatigue    Complete by:  As directed      Call MD for:  hives    Complete by:  As directed  Call MD for:  persistant dizziness or light-headedness    Complete by:  As directed      Call MD for:  persistant nausea and vomiting    Complete by:  As directed      Call MD for:  redness, tenderness, or signs of infection (pain, swelling, redness, odor or green/yellow discharge around incision site)    Complete by:  As directed      Call MD for:  severe uncontrolled pain    Complete by:  As directed      Call MD for:  temperature >100.4    Complete by:  As directed      Diet - low sodium heart healthy    Complete by:  As directed      Increase activity slowly    Complete by:  As directed            Consultations: None.  Procedures Performed:  Dg Chest 2 View  11/19/2014   CLINICAL DATA:  Productive cough  EXAM: CHEST  2 VIEW  COMPARISON:  11/11/2014  FINDINGS: Cardiac shadow is within normal limits. Some increasing infiltrate is noted in the right lung base projecting in the right middle lobe on the lateral projection. No other focal infiltrate is seen. No bony abnormality is noted.  IMPRESSION: Early right middle lobe infiltrate.   Electronically Signed   By: Inez Catalina M.D.   On: 11/19/2014 12:38   Dg Chest 2 View  11/11/2014   CLINICAL DATA:  54 year old male with cough productive of sputum for 2 days. Left lower chest pain. Recent cholecystectomy. Initial encounter.  EXAM: CHEST  2 VIEW  COMPARISON:  11/05/2014 and earlier.  FINDINGS: Lung volumes remain normal. Normal cardiac size and mediastinal contours. Visualized tracheal air column is within normal limits. Lung parenchyma is stable and clear. No pleural effusion or confluent pulmonary opacity. No acute osseous abnormality identified. Visualized bowel gas pattern is non obstructed.  IMPRESSION: No acute cardiopulmonary abnormality.   Electronically Signed    By: Genevie Ann M.D.   On: 11/11/2014 12:22   Chest 2 View  11/05/2014   CLINICAL DATA:  Preop testing.  Initial evaluation.  EXAM: CHEST  2 VIEW  COMPARISON:  07/04/2014.  FINDINGS: Mediastinum hilar structures are normal. No evidence of infiltrate on today's exam. Stable nipple shadows. No pleural effusion or pneumothorax. Heart size normal. No acute bony abnormality.  IMPRESSION: No active cardiopulmonary disease.   Electronically Signed   By: Marcello Moores  Register   On: 11/05/2014 13:54   Dg Cholangiogram Operative  11/06/2014   CLINICAL DATA:  Cholecystectomy for cholelithiasis and chronic cholecystitis.  EXAM: INTRAOPERATIVE CHOLANGIOGRAM  TECHNIQUE: Cholangiographic images from the C-arm fluoroscopic device were submitted for interpretation post-operatively. Please see the procedural report for the amount of contrast and the fluoroscopy time utilized.  COMPARISON:  Hepatobiliary study on 09/11/2014  FINDINGS: Intraoperative cholangiogram obtained with a C-arm demonstrates a normal opacified biliary tree without evidence of obstruction or filling defect. Contrast enters the duodenum normally. No evidence of contrast extravasation.  IMPRESSION: Normal intraoperative cholangiogram.   Electronically Signed   By: Aletta Edouard M.D.   On: 11/06/2014 08:48   Ct Angio Chest Pe W/cm &/or Wo Cm  11/11/2014   CLINICAL DATA:  54 year old male with recent cholecystectomy. Chest pain and shortness of Breath today. Initial encounter. Current history of HIV.  EXAM: CT ANGIOGRAPHY CHEST WITH CONTRAST  TECHNIQUE: Multidetector CT imaging of the chest was performed using the standard protocol  during bolus administration of intravenous contrast. Multiplanar CT image reconstructions and MIPs were obtained to evaluate the vascular anatomy.  CONTRAST:  110 mL Omnipaque 350.  COMPARISON:  Chest radiographs 1207 hr today and earlier. Chest CTA and CT Abdomen and Pelvis 02/02/2014  FINDINGS: 2 contrast bolus injections were performed.  The first was aborted due to burning sensation reported by the patient. Ultimately adequate contrast bolus timing in the pulmonary arterial tree. Density in the main pulmonary artery of 185 Hounsfield units. However, there is respiratory motion in the upper and lower lobes. Subsequently the segmental and distal branches are not well evaluated. No central or hilar pulmonary artery filling defect identified.  Atelectatic changes to the major airways. Lower lung volumes on the second imaging attempts. Small calcified subpleural non blunt right upper lobe probe on series 703, image 20 persistent change. No acute or confluent pulmonary opacity. No pleural effusion.  No pericardial effusion. Negative visualized aorta. No mediastinal or hilar lymphadenopathy. No axillary lymphadenopathy. Negative thoracic inlet.  The gallbladder is surgically absent. Minimal stranding in the gallbladder fossa. The hepatic flexure of colon closely abuts the gallbladder fossa and appears within normal limits.  Otherwise the visualized liver, spleen, pancreas, adrenal glands, and bowel in the upper abdomen are within normal limits. Visualized kidneys are stable with bilateral upper pole cyst. There is renal contrast excretion occurring.  Mild scoliosis.   No acute osseous abnormality identified.  Review of the MIP images confirms the above findings.  IMPRESSION: 1. Suboptimal evaluation for PE related to respiratory motion and less than ideal pulmonary artery contrast timing despite repeated attempts. No central or hilar pulmonary embolus. 2. Pulmonary atelectasis. No acute findings identified in the chest. 3. Mild postoperative changes in the gallbladder fossa with no adverse features identified.   Electronically Signed   By: Genevie Ann M.D.   On: 11/11/2014 16:49   Dg Abd Portable 1v  11/19/2014   CLINICAL DATA:  Nausea and vomiting  EXAM: PORTABLE ABDOMEN - 1 VIEW  COMPARISON:  None.  FINDINGS: Scattered large and small bowel gas is noted.  No abnormal mass or abnormal calcifications are seen. Changes consistent with prior cholecystectomy are noted. No acute bony abnormality is noted.  IMPRESSION: No acute abnormality seen.   Electronically Signed   By: Inez Catalina M.D.   On: 11/19/2014 16:31   Admission HPI:  Casey Brennan is a 54 year old man with history of HIV (last CD4 490, HIV undetectable on 04/14/2014), anemia, asthma, arthritis, prostate cancer status post prostatectomy in 2014, and GERD presenting with cough and fever. The patient had a laparoscopic cholecystectomy on 11/06/2014. After returning home the following day, he reports developing a cough productive of thick yellow sputum that has been getting worse. He reports fevers up to 102 at home along with night sweats, body aches, and headaches. He has had associated nasal and chest congestion, and he has been unable to sleep at night due to coughing. He denies any sick contacts. He was seen in the ER 8 days ago, and workup including CTA to look for pulmonary embolism was negative (suboptimal evaluation of PE due to respiratory motion). Per the sister, he was supposed to receive a prescription for azithromycin, but he did not receive this prescription. Since that time, his symptoms have gotten worse. He attempted to get into see his infectious disease doctor yesterday, but no appointments were available. He has been taking over-the-counter flu and cold medicines along with his home DuoNeb's up to 3  times per day, but nothing is helping with his cough. He denies chest pain, but reports pain in his abdomen and back secondary to his coughing. He has also noted a small bump adjacent to one of his scars from his surgery along with worsening abdominal distention over the past couple of days. She reports some constipation with his last bowel movement approximately 3 days ago, but he thinks he can have a bowel movement today. He has continued to pass gas.  In the ED, he was afebrile and  his vital signs were normal. Chest x-ray showed an early right middle lobe infiltrate. He was Tussionex, DuoNeb's, 1 L of normal saline, and he was started on vancomycin and aztreonam for HCAP.  Hospital Course by problem list: Principal Problem:   HCAP (healthcare-associated pneumonia) Active Problems:   Human immunodeficiency virus (HIV) disease   Hx of primary hypertension   COPD GOLD III   GERD (gastroesophageal reflux disease)   Allergic rhinitis   #Healthcare associated pneumonia The patient had recently undergone a laparoscopic cholecystectomy, and he developed worsening cough productive of yellow sputum, fevers, chills, and body aches after returning home. He had previously been evaluated in the emergency room one week ago with a CTA which did not show any evidence of PE. On this admission, he was noted to have an early right middle lobe infiltrate. He was started on vancomycin and aztreonam IV given his penicillin allergy. He was afebrile throughout the hospitalization and satting in the high 90s on room air. Sputum gram stain showed no organisms, and no growth was observed on sputum culture or blood culture prior to discharge. Strep and Legionella antigens along with influenza panel were negative, and his pro-calcitonin was less than 0.1. Given his symptoms and lack of growth on cultures along with negative pro-calcitonin, is likely that he had a viral pneumonia. On the second day of admission, he was transitioned to oral Levaquin, and he was observed for an additional 24 hours with improvement of his shortness of breath and cough. He was discharged home with a prescription to complete a total of 7 days of antibiotics in case of a bacterial causative agent.  #Status post laparoscopic cholecystectomy The patient undergone a laparoscopic cholecystectomy approximately 2 weeks prior to his presentation. He reported increasing abdominal distention and pain along with no bowel movement for the  past few days. General surgery was consulted to evaluate the patient, and they had very little concern of an operative complication. They recommended a 1 view abdominal x-ray, which was negative. The patient had a bowel movement the night of admission with significant improvement of his abdominal pain and distention. He was started on bisacodyl to help with his constipation, and he was given a prescription for this at discharge. He was also given a prescription for Percocet to hold him over until his follow-up appointment with Gen. surgery in 1 week.  #Iron deficiency anemia The patient has had long-standing anemia, and his hemo-globin was stable on presentation. Prior anemia panels demonstrated iron deficiency anemia, but he had not been started on iron supplementation. The patient was started on ferrous sulfate 325 mg daily, and this was continued at discharge.  #Asthma/COPD The patient follows with Dr. Melvyn Novas a pulmonology, who thinks that the patient likely has an element of COPD due to his history of smoking. In addition, he thinks the patient's asthma/COPD may be triggered by his GERD, and the patient is currently on Prilosec at home.  The patient had some  wheezing on exam, likely triggered by his pneumonia. He was continued on his home medications along with scheduled DuoNeb's with improvement of his wheezing.  #HIV The patient has well-controlled HIV, and he currently follows with Dr. [name]. His CD4 count was checked during the hospitalization and found to be 370. HIV viral load was pending at discharge.  #History of hypertension The patient was mildly hypertensive on presentation to the emergency room, and his blood pressure remained slightly elevated on admission. He denies ever being on blood pressure medications in the past, but his blood pressure has been elevated on multiple occasions and in different settings in the past. During this hospitalization, his blood pressure improved to normal  with control of his pain.  Discharge Vitals:   BP 141/86 mmHg  Pulse 105  Temp(Src) 99 F (37.2 C) (Oral)  Resp 16  Wt 210 lb 12.8 oz (95.618 kg)  SpO2 99%  Discharge Labs:  No results found for this or any previous visit (from the past 24 hour(s)).  Signed: Charlesetta Shanks, MD 11/21/2014, 5:23 PM    Services Ordered on Discharge: None. Equipment Ordered on Discharge: None.

## 2014-11-22 LAB — CULTURE, RESPIRATORY

## 2014-11-22 LAB — CULTURE, RESPIRATORY W GRAM STAIN: Culture: NORMAL

## 2014-11-23 ENCOUNTER — Telehealth: Payer: Self-pay | Admitting: Internal Medicine

## 2014-11-23 NOTE — Telephone Encounter (Signed)
Call to patient to confirm appointment for 11/24/14 at 2:15 lmtcb.

## 2014-11-24 ENCOUNTER — Encounter: Payer: Self-pay | Admitting: Internal Medicine

## 2014-11-24 ENCOUNTER — Ambulatory Visit (INDEPENDENT_AMBULATORY_CARE_PROVIDER_SITE_OTHER): Payer: Medicare Other | Admitting: Internal Medicine

## 2014-11-24 VITALS — BP 124/86 | HR 98 | Temp 98.3°F | Ht 69.0 in | Wt 213.5 lb

## 2014-11-24 DIAGNOSIS — K8020 Calculus of gallbladder without cholecystitis without obstruction: Secondary | ICD-10-CM

## 2014-11-24 DIAGNOSIS — J189 Pneumonia, unspecified organism: Secondary | ICD-10-CM | POA: Diagnosis present

## 2014-11-24 DIAGNOSIS — Y95 Nosocomial condition: Secondary | ICD-10-CM | POA: Diagnosis not present

## 2014-11-24 DIAGNOSIS — J45909 Unspecified asthma, uncomplicated: Secondary | ICD-10-CM

## 2014-11-24 LAB — HIV-1 RNA QUANT-NO REFLEX-BLD

## 2014-11-24 MED ORDER — BUDESONIDE 180 MCG/ACT IN AEPB: 1.0000 | INHALATION_SPRAY | Freq: Every day | RESPIRATORY_TRACT | Status: DC

## 2014-11-24 MED ORDER — OXYCODONE-ACETAMINOPHEN 5-325 MG PO TABS: 1.0000 | ORAL_TABLET | Freq: Four times a day (QID) | ORAL | Status: DC | PRN

## 2014-11-24 NOTE — Progress Notes (Signed)
   Subjective:    Patient ID: Casey Brennan, male    DOB: [birth year], 54 y.o.   MRN: [id number]  HPI Casey Brennan is a 54yo man with PMHx of HTN, HIV, GERD, asthma, COPD, iron deficiency anemia, and prostate cancer s/p prostatectomy in 2014 who presents today for a hospital follow up.  He was hospitalized from 5/19-5/21 for healthcare associated pneumonia (patient underwent laparoscopic cholecystectomy on 11/06/14) that was presumed to be viral given his negative cultures and infiltrate not impressive on CXR.  He was discharged on Levaquin to complete a total of 7 days of antibiotics as he did improve with antibiotics during hospitalization.   Patient had also been concerned about increased abdominal distention and pain during his hospitalization since he had his lap choly. General surgery was consulted to evaluate him and had little concern of an operative complication. An abdominal x-ray was negative for any acute process.   Today, patient states he is still having a cough productive of yellowish sputum. He also notes fevers at home up to 101.6. He reports associated sweats, pleuritic chest pain, congestion, nausea, and sore throat. He notes decreased appetite and sleep. He states he has been compliant with the levofloxacin and has 1 day left. He reports the Tussionex has helped his cough. He reports he is having some abdominal pain and constipation. He states he has been taking Dulcolax which alleviates his constipation.     Review of Systems General: Denies changes in weight HEENT: Denies headaches, ear pain, changes in vision CV: Denies palpitations, orthopnea Pulm: Denies wheezing GI: Denies vomiting, diarrhea, melena, hematochezia GU: Denies dysuria, hematuria, frequency Msk: Denies muscle cramps, joint pains Neuro: Denies weakness, numbness, tingling Skin: Denies rashes, bruising    Objective:   Physical Exam General: middle aged man sitting up in chair, NAD HEENT: [place]/AT, EOMI,  sclera anicteric. No tenderness to palpation of maxillary or frontal sinuses. Pharynx non-erythematous, mucus membranes moist CV: RRR, no m/g/r Pulm: CTA bilaterally, breaths non-labored. No wheezing, rhonchi, or rales appreciated. Abd: BS+, soft, non-distended. Mild tenderness to palpation in RLQ Ext: warm, no edema Neuro: alert and oriented x 3, no focal deficits      Assessment & Plan:  Please refer to A&P documentation

## 2014-11-24 NOTE — Assessment & Plan Note (Signed)
Patient afebrile and other vitals normal. No rales or wheezing on exam. Suspect he has a viral upper respiratory infection as initially thought while he was hospitalized. Since he has almost completed the levaquin course this leans more towards a viral infection as well. Atypical infections tested in the hospital were negative. If he did have a bacterial pneumonia this would have been covered by the levaquin. Will pursue conservative treatment for now.   Recommended to: - Finish levaquin course - Continue Tussionex for cough - Try Mucinex for mucus/congestion - Drink plenty of fluids - Return to clinic in 5-7 days if symptoms do not improve

## 2014-11-24 NOTE — Patient Instructions (Signed)
It was a pleasure taking care of you, Casey Brennan.  - Likely that you have a viral upper respiratory infection - Continue Tussionex, plenty of fluids, tylenol for fever - Can try mucinex to help with cough - Please complete the Levaquin you got from the hospital  - Your abdominal pain is likely related to constipation from the surgery and pain medications  - Continue dulcolax as needed - Follow up with surgery on Friday  General Instructions:   Please bring your medicines with you each time you come to clinic.  Medicines may include prescription medications, over-the-counter medications, herbal remedies, eye drops, vitamins, or other pills.   Progress Toward Treatment Goals:  Treatment Goal 10/23/2014  Blood pressure at goal    Self Care Goals & Plans:  Self Care Goal 10/23/2014  Manage my medications take my medicines as prescribed; bring my medications to every visit; refill my medications on time  Eat healthy foods eat more vegetables; eat foods that are low in salt; eat baked foods instead of fried foods    No flowsheet data found.   Care Management & Community Referrals:  No flowsheet data found.

## 2014-11-25 ENCOUNTER — Telehealth: Payer: Self-pay | Admitting: *Deleted

## 2014-11-25 NOTE — Progress Notes (Signed)
Internal Medicine Clinic Attending  Case discussed with Dr. Rivet at the time of the visit.  We reviewed the resident's history and exam and pertinent patient test results.  I agree with the assessment, diagnosis, and plan of care documented in the resident's note.  

## 2014-11-25 NOTE — Telephone Encounter (Signed)
Received faxed PA from pt's pharmacy for his Pulmicort 131mcg Flexhaler (120puffs).  Medication is non-preferred.  Contacted pt's insurance and they would like for pt to try Asmanex HFA inhaler or Asmanex twisthaler.  Will forward to pcp for review.  Please advise.Despina Hidden Cassady5/25/20164:54 PM       ID# [id number] [phone]

## 2014-11-26 ENCOUNTER — Ambulatory Visit (HOSPITAL_COMMUNITY)
Admission: RE | Admit: 2014-11-26 | Discharge: 2014-11-26 | Disposition: A | Discharge status: Home / Self Care | Payer: Medicare Other | Source: Ambulatory Visit | Attending: Internal Medicine | Admitting: Internal Medicine

## 2014-11-26 ENCOUNTER — Ambulatory Visit (INDEPENDENT_AMBULATORY_CARE_PROVIDER_SITE_OTHER): Payer: Medicare Other | Admitting: Internal Medicine

## 2014-11-26 ENCOUNTER — Encounter: Payer: Self-pay | Admitting: Internal Medicine

## 2014-11-26 ENCOUNTER — Telehealth: Payer: Self-pay | Admitting: Internal Medicine

## 2014-11-26 VITALS — BP 126/75 | HR 86 | Temp 98.3°F | Ht 69.0 in | Wt 210.8 lb

## 2014-11-26 DIAGNOSIS — R918 Other nonspecific abnormal finding of lung field: Secondary | ICD-10-CM | POA: Insufficient documentation

## 2014-11-26 DIAGNOSIS — R509 Fever, unspecified: Secondary | ICD-10-CM | POA: Diagnosis not present

## 2014-11-26 DIAGNOSIS — R61 Generalized hyperhidrosis: Secondary | ICD-10-CM | POA: Insufficient documentation

## 2014-11-26 DIAGNOSIS — Z21 Asymptomatic human immunodeficiency virus [HIV] infection status: Secondary | ICD-10-CM | POA: Diagnosis not present

## 2014-11-26 DIAGNOSIS — R05 Cough: Secondary | ICD-10-CM | POA: Diagnosis not present

## 2014-11-26 DIAGNOSIS — J189 Pneumonia, unspecified organism: Secondary | ICD-10-CM | POA: Diagnosis not present

## 2014-11-26 DIAGNOSIS — D509 Iron deficiency anemia, unspecified: Secondary | ICD-10-CM

## 2014-11-26 DIAGNOSIS — Y95 Nosocomial condition: Secondary | ICD-10-CM | POA: Diagnosis not present

## 2014-11-26 LAB — CULTURE, BLOOD (ROUTINE X 2)
Culture: NO GROWTH
Culture: NO GROWTH

## 2014-11-26 LAB — IRON: Iron: 11 ug/dL — ABNORMAL LOW (ref 42–165)

## 2014-11-26 LAB — FERRITIN: Ferritin: 27 ng/mL (ref 22–322)

## 2014-11-26 MED ORDER — HYDROCOD POLST-CPM POLST ER 10-8 MG/5ML PO SUER: 5.0000 mL | Freq: Two times a day (BID) | ORAL | Status: DC | PRN

## 2014-11-26 NOTE — Telephone Encounter (Signed)
Reason for call:   We received a call from Ms.Langley on the on call pager in regards to her brother Mr. COLTEN DESROCHES at approximately 5:50 AM indicating that she is concerned for her brother who continues to have night sweats and feeling very tired. Ms. Bland Span explains that recently he has been having more frequent night sweats, tonight he was noted to have them at 1am, 230am, 330am, and then feeling more tired recently as well since recent hospital discharge.    Pertinent Data:   Increased night sweats, fevers of 101-102F per sister that improve with tylenol, and feeling more tired.   He has completed his course of Levaquin but feels worse since discharge.  SOB is about the same as before, still coughing with productive mucous.  He denies chest pain, vomiting, or diarrhea but she says he has had more nausea lately and feeling like he will throw up, decreased appetite, fevers, and sweats. She does report him to have a small BM after dulcolax recently and he is passing gas. Still having some abdominal pain s/p surgery.   He has a follow up appt with surgery this Friday with Dr. Hulen Skains  Recent hospital discharge for HCAP thought to be viral etiology but still discharged with levaquin course that he has completed  She reports his HIV is well controlled--HIV quant from admission is noted that it needs to be repeated for analysis but insufficient quantity of specimen remaining. CD4 count down to 370.  Recently seen by PCP Dr. Arcelia Jew in clinic 5/24 for hospital follow up, advised to continue levaquin, tussionex and try mucinex. Ms. Bland Span said Dr. Arcelia Jew told them to call back if he is not improving and that is why they called.   Sister, Ms. Bland Span claims she is his care taker as well.   I could hear Mr. Dady in the background and he was responding to his sister's questions but he did not wish to speak to me at this time and instead wanted his sister to talk.  During the day she says  he does better but was more tired today in general.   Assessment / Plan / Recommendations:   Mr. Mousel is a 54 year old male with HIV and s/p lap cholecystectomy who is noted to have more night sweats recently, occasional fevers, feeling more tired, and increased nausea since hospital discharge per his sister's report. Denies chest pain, vomiting, but still has SOB and productive cough.   Give that Mr. Farruggia and his sister feel like is getting worse with above symptoms, I have recommended them to return for further evaluation. We can try to have them seen in opc this morning and they are agreeable to come around 8am.   I did advise Ms. Bland Span that if she feels like he is getting worse, or develops worsening fevers, or shortness of breath or chest pain in the meantime to take him directly to the emergency room. She voices understanding but says for now they prefer to come to clinic.   I will send this note and ask the front desk to schedule them an appointment this morning if possible. They will try to be here first thing this morning and are willing to be worked in to an open slot if available.   I will also forward this note to the PCP, Dr. Arcelia Jew who recently saw him in the office.  As always, pt is advised that if symptoms worsen or new symptoms arise, they should go  to an urgent care facility or to to ER for further evaluation.   Wilber Oliphant, MD   11/26/2014, 6:04 AM

## 2014-11-26 NOTE — Assessment & Plan Note (Addendum)
Continues to have some productive cough but better with tussionex. Continues to have fever, night sweats, and tiredness. Last fever 6 am this morning, raning 101.5-102. Afebrile here. Last tylenol 3 AM.   Not clear whether this is persistence of his HCAP vs viral infection that's improving but just taking longer than expected or whether there progression of his previous RML pneumonia or development of para pneumonic effusion. He has no dysuria, rashes, lymphadenopatho suggestive of any other processes that would cause the fever. His CD4 was 370 1 week ago, less likely to be from atypical infections such as PCP or MAI. He did have cholecystectomy recently and one possibility is occurrence of abdominal infection but on exam he does not have much tenderness.  Does have hx of PCA s/p surgery but he reports he has been following with his urologist and his PSA has been fine so this is less likely from that.  Had colonoscopy 07/2014 which only showed hemorrhoids.   We reviewed his last CT angio of chest with Dr. Eppie Gibson and we don't see any significant lymphadenopathies.   Will get CXR to evaluate for progression of previous RML pneumonia or development of parapneumonic effusion.  CBC with diff Asked patient to continue supportive care for now as he already completed Levaquin 7 days. Asked to follow up if not better in 1 week unless CXR results show anything. Refilled tussionex.  Next step may include CT abdomen if symptoms persist or discussion with ID Dr. [name] who manages his HIV.  Addendum: CXR showed improvement of previous RML infiltrate. No new infiltrate or effusion.

## 2014-11-26 NOTE — Patient Instructions (Signed)
Go to the lab to get your blood work. Then head upstair to get your chest xray.  We will let you know if anything comes back abnormal.  Keep taking the same meds as you are. Take tylenol as needed for fever.  Follow up as needed, definitely let us know if he gets worse or does not improve in next 1 week.

## 2014-11-26 NOTE — Progress Notes (Signed)
Subjective:    Patient ID: Casey Brennan, male    DOB: 09-28-1960, 54 y.o.   MRN: [id number]  HPI  54 yo male with hx of HIV, Asthma/COPD gold III, GERD, h/o prostate cancer s/p surgery, recent HCAP and cholecystectomy here with on going fever/night sweat/tiredness.  Was hospitalized 11/19/2014 to 11/21/2014 with HCAP. Inpatient team was not convinced this was pneumonia, leaned more towards viral etiology (cultures were neg, CXR was not very impressive for the RML infiltrate), but treated with Levaquin. Strep and legionella neg, flu neg.   Surgery team was also consulted during that admission for possible ab complication from the cholecystectomy recently as he was having some ab distension and constipationut they did not think there was any complication.   Was seen by Dr. Arcelia Jew on 11/24/14 for HFU, he continued to have productive cough, fever, and night sweats at that point. He was still completing 7 days of levaquin at that time.  His sister called on call phone phone overnight stating patient continued to have fever 101-102F with night sweats, productive cough, and tiredness.   His constipation and ab  improved with dulcolax.  His last CD4 count was 370 1 week ago. Compliant with his HIV meds.   See problem based charting.  Review of Systems  Constitutional: Positive for fever, chills and fatigue.  HENT: Positive for congestion and rhinorrhea. Negative for nosebleeds, sneezing and sore throat.   Eyes: Negative for visual disturbance.  Respiratory: Positive for cough. Negative for chest tightness, shortness of breath and wheezing.   Cardiovascular: Negative for palpitations and leg swelling.       Has chest pain only when coughing.  Gastrointestinal: Positive for abdominal pain and constipation. Negative for nausea, vomiting, diarrhea and blood in stool.  Genitourinary: Negative for dysuria, hematuria and penile swelling.  Musculoskeletal: Negative for myalgias, back pain, joint  swelling, arthralgias, neck pain and neck stiffness.  Skin: Negative.   Neurological: Negative for weakness, numbness and headaches.  Hematological: Negative for adenopathy.  Psychiatric/Behavioral: Negative.        Objective:   Physical Exam  Constitutional: He is oriented to person, place, and time. He appears well-developed and well-nourished. No distress.  Pleasant male. Accompanied by his sister.  HENT:  Head: Normocephalic and atraumatic.  Mouth/Throat: Oropharynx is clear and moist. No oropharyngeal exudate.  Eyes: Conjunctivae are normal. Pupils are equal, round, and reactive to light. No scleral icterus.  Neck: Normal range of motion.  Cardiovascular: Normal rate, regular rhythm and normal heart sounds.  Exam reveals no gallop and no friction rub.   No murmur heard. Pulmonary/Chest: Effort normal and breath sounds normal. No stridor. No respiratory distress. He has no wheezes. He has no rales. He exhibits no tenderness.  Abdominal: Soft. Bowel sounds are normal.  Has mild tenderness to palpation on LLQ and epigastric region. Surgical scar is well healing.   Has ~1cm rounded superficial mass on right lower quadrant with some tenderness to palpation.   Musculoskeletal: Normal range of motion. He exhibits no edema or tenderness.  Lymphadenopathy:    He has no cervical adenopathy.    He has no axillary adenopathy.       Right: No inguinal adenopathy present.       Left: No inguinal adenopathy present.  Neurological: He is alert and oriented to person, place, and time.  Skin: He is not diaphoretic.      Filed Vitals:   11/26/14 1040  BP: 126/75  Pulse: 86  Temp:  98.3 F (36.8 C)       Assessment & Plan:  See problem based a&p.

## 2014-11-26 NOTE — Assessment & Plan Note (Addendum)
Has hx of iron def anemia, is on PO iron. Last iron panel was in 2014. Last hgb was 8-9.   Will recheck CBC, Iron, and ferritin today. Had colonoscopy 07/2014 which only showed hemorrhoids.   Addendum: hgb 8.8, iron level remains low despite being on PO iron. May benefit from IV iron therapy.

## 2014-11-27 LAB — CBC WITH DIFFERENTIAL/PLATELET
Basophils Absolute: 0 10*3/uL (ref 0.0–0.1)
Basophils Relative: 0 % (ref 0–1)
Eosinophils Absolute: 0.2 10*3/uL (ref 0.0–0.7)
Eosinophils Relative: 4 % (ref 0–5)
HCT: 30.2 % — ABNORMAL LOW (ref 39.0–52.0)
Hemoglobin: 8.8 g/dL — ABNORMAL LOW (ref 13.0–17.0)
Lymphocytes Relative: 39 % (ref 12–46)
Lymphs Abs: 1.8 10*3/uL (ref 0.7–4.0)
MCH: 21.2 pg — ABNORMAL LOW (ref 26.0–34.0)
MCHC: 29.1 g/dL — ABNORMAL LOW (ref 30.0–36.0)
MCV: 72.6 fL — ABNORMAL LOW (ref 78.0–100.0)
Monocytes Absolute: 0.5 10*3/uL (ref 0.1–1.0)
Monocytes Relative: 10 % (ref 3–12)
Neutro Abs: 2.2 10*3/uL (ref 1.7–7.7)
Neutrophils Relative %: 47 % (ref 43–77)
Platelets: 273 10*3/uL (ref 150–400)
RBC: 4.16 MIL/uL — ABNORMAL LOW (ref 4.22–5.81)
RDW: 22.6 % — ABNORMAL HIGH (ref 11.5–15.5)
WBC: 4.6 10*3/uL (ref 4.0–10.5)

## 2014-12-02 ENCOUNTER — Other Ambulatory Visit: Payer: Medicare Other

## 2014-12-02 DIAGNOSIS — Z79899 Other long term (current) drug therapy: Secondary | ICD-10-CM | POA: Diagnosis not present

## 2014-12-02 DIAGNOSIS — B20 Human immunodeficiency virus [HIV] disease: Secondary | ICD-10-CM | POA: Diagnosis not present

## 2014-12-02 DIAGNOSIS — Z113 Encounter for screening for infections with a predominantly sexual mode of transmission: Secondary | ICD-10-CM

## 2014-12-02 LAB — LIPID PANEL
Cholesterol: 153 mg/dL (ref 0–200)
HDL: 57 mg/dL (ref >=40)
LDL Cholesterol: 78 mg/dL (ref 0–99)
Total CHOL/HDL Ratio: 2.7 Ratio
Triglycerides: 92 mg/dL (ref <150)
VLDL: 18 mg/dL (ref 0–40)

## 2014-12-02 LAB — CBC
HCT: 31.8 % — ABNORMAL LOW (ref 39.0–52.0)
Hemoglobin: 9.2 g/dL — ABNORMAL LOW (ref 13.0–17.0)
MCH: 20.6 pg — ABNORMAL LOW (ref 26.0–34.0)
MCHC: 28.9 g/dL — ABNORMAL LOW (ref 30.0–36.0)
MCV: 71.1 fL — ABNORMAL LOW (ref 78.0–100.0)
Platelets: 324 10*3/uL (ref 150–400)
RBC: 4.47 MIL/uL (ref 4.22–5.81)
RDW: 22.6 % — ABNORMAL HIGH (ref 11.5–15.5)
WBC: 5.9 10*3/uL (ref 4.0–10.5)

## 2014-12-02 LAB — COMPREHENSIVE METABOLIC PANEL
ALT: 12 U/L (ref 0–53)
AST: 15 U/L (ref 0–37)
Albumin: 4.4 g/dL (ref 3.5–5.2)
Alkaline Phosphatase: 154 U/L — ABNORMAL HIGH (ref 39–117)
BUN: 8 mg/dL (ref 6–23)
CO2: 27 mEq/L (ref 19–32)
Calcium: 9.1 mg/dL (ref 8.4–10.5)
Chloride: 103 mEq/L (ref 96–112)
Creat: 1.11 mg/dL (ref 0.50–1.35)
Glucose, Bld: 95 mg/dL (ref 70–99)
Potassium: 4.1 mEq/L (ref 3.5–5.3)
Sodium: 137 mEq/L (ref 135–145)
Total Bilirubin: 0.7 mg/dL (ref 0.2–1.2)
Total Protein: 7.5 g/dL (ref 6.0–8.3)

## 2014-12-03 LAB — T-HELPER CELL (CD4) - (RCID CLINIC ONLY)
CD4 % Helper T Cell: 18 % — ABNORMAL LOW (ref 33–55)
CD4 T Cell Abs: 340 /uL — ABNORMAL LOW (ref 400–2700)

## 2014-12-03 LAB — RPR

## 2014-12-04 LAB — HIV-1 RNA QUANT-NO REFLEX-BLD
HIV 1 RNA Quant: <20 copies/mL (ref <20)
HIV-1 RNA Quant, Log: <1.3 {Log} (ref <1.30)

## 2014-12-07 NOTE — Progress Notes (Signed)
I saw and evaluated the patient. I personally confirmed the key portions of Dr. Ahmed's history and exam and reviewed pertinent patient test results. The assessment, diagnosis, and plan were formulated together and I agree with the documentation in the resident's note. 

## 2014-12-08 DIAGNOSIS — L509 Urticaria, unspecified: Secondary | ICD-10-CM | POA: Diagnosis not present

## 2014-12-08 DIAGNOSIS — T5991XA Toxic effect of unspecified gases, fumes and vapors, accidental (unintentional), initial encounter: Secondary | ICD-10-CM | POA: Diagnosis not present

## 2014-12-08 DIAGNOSIS — I10 Essential (primary) hypertension: Secondary | ICD-10-CM | POA: Diagnosis not present

## 2014-12-08 DIAGNOSIS — E876 Hypokalemia: Secondary | ICD-10-CM | POA: Diagnosis not present

## 2014-12-08 DIAGNOSIS — Z87891 Personal history of nicotine dependence: Secondary | ICD-10-CM | POA: Diagnosis not present

## 2014-12-08 DIAGNOSIS — J45901 Unspecified asthma with (acute) exacerbation: Secondary | ICD-10-CM | POA: Diagnosis not present

## 2014-12-08 DIAGNOSIS — R0600 Dyspnea, unspecified: Secondary | ICD-10-CM | POA: Diagnosis not present

## 2014-12-08 DIAGNOSIS — J680 Bronchitis and pneumonitis due to chemicals, gases, fumes and vapors: Secondary | ICD-10-CM | POA: Diagnosis not present

## 2014-12-08 DIAGNOSIS — Z79899 Other long term (current) drug therapy: Secondary | ICD-10-CM | POA: Diagnosis not present

## 2014-12-08 DIAGNOSIS — K219 Gastro-esophageal reflux disease without esophagitis: Secondary | ICD-10-CM | POA: Diagnosis not present

## 2014-12-08 DIAGNOSIS — L738 Other specified follicular disorders: Secondary | ICD-10-CM | POA: Diagnosis not present

## 2014-12-08 DIAGNOSIS — R0602 Shortness of breath: Secondary | ICD-10-CM | POA: Diagnosis not present

## 2014-12-08 DIAGNOSIS — Z9049 Acquired absence of other specified parts of digestive tract: Secondary | ICD-10-CM | POA: Diagnosis not present

## 2014-12-08 DIAGNOSIS — Z21 Asymptomatic human immunodeficiency virus [HIV] infection status: Secondary | ICD-10-CM | POA: Diagnosis not present

## 2014-12-08 DIAGNOSIS — R7989 Other specified abnormal findings of blood chemistry: Secondary | ICD-10-CM | POA: Diagnosis not present

## 2014-12-08 NOTE — Addendum Note (Signed)
Addended by: Dellia Nims on: 12/08/2014 07:11 AM   Modules accepted: Level of Service

## 2014-12-09 DIAGNOSIS — E876 Hypokalemia: Secondary | ICD-10-CM | POA: Diagnosis not present

## 2014-12-09 DIAGNOSIS — R05 Cough: Secondary | ICD-10-CM | POA: Diagnosis not present

## 2014-12-09 DIAGNOSIS — B20 Human immunodeficiency virus [HIV] disease: Secondary | ICD-10-CM | POA: Diagnosis not present

## 2014-12-09 DIAGNOSIS — J40 Bronchitis, not specified as acute or chronic: Secondary | ICD-10-CM | POA: Diagnosis not present

## 2014-12-09 DIAGNOSIS — G40909 Epilepsy, unspecified, not intractable, without status epilepticus: Secondary | ICD-10-CM | POA: Diagnosis not present

## 2014-12-09 DIAGNOSIS — R062 Wheezing: Secondary | ICD-10-CM | POA: Diagnosis not present

## 2014-12-09 DIAGNOSIS — L299 Pruritus, unspecified: Secondary | ICD-10-CM | POA: Diagnosis not present

## 2014-12-10 DIAGNOSIS — L299 Pruritus, unspecified: Secondary | ICD-10-CM | POA: Diagnosis not present

## 2014-12-10 DIAGNOSIS — J40 Bronchitis, not specified as acute or chronic: Secondary | ICD-10-CM | POA: Diagnosis not present

## 2014-12-10 DIAGNOSIS — E876 Hypokalemia: Secondary | ICD-10-CM | POA: Diagnosis not present

## 2014-12-10 DIAGNOSIS — R05 Cough: Secondary | ICD-10-CM | POA: Diagnosis not present

## 2014-12-10 DIAGNOSIS — R062 Wheezing: Secondary | ICD-10-CM | POA: Diagnosis not present

## 2014-12-10 DIAGNOSIS — B20 Human immunodeficiency virus [HIV] disease: Secondary | ICD-10-CM | POA: Diagnosis not present

## 2014-12-10 DIAGNOSIS — G40909 Epilepsy, unspecified, not intractable, without status epilepticus: Secondary | ICD-10-CM | POA: Diagnosis not present

## 2014-12-14 ENCOUNTER — Telehealth: Payer: Self-pay | Admitting: Internal Medicine

## 2014-12-14 NOTE — Telephone Encounter (Signed)
Call to patient to confirm appointment for 12/15/14 at 3:15 lmtcb

## 2014-12-15 ENCOUNTER — Encounter: Payer: Medicare Other | Admitting: Internal Medicine

## 2014-12-16 ENCOUNTER — Ambulatory Visit (INDEPENDENT_AMBULATORY_CARE_PROVIDER_SITE_OTHER): Payer: Medicare Other | Admitting: Infectious Diseases

## 2014-12-16 ENCOUNTER — Other Ambulatory Visit: Payer: Self-pay | Admitting: *Deleted

## 2014-12-16 ENCOUNTER — Encounter: Payer: Self-pay | Admitting: Infectious Diseases

## 2014-12-16 VITALS — BP 133/80 | HR 92 | Temp 98.4°F | Wt 203.0 lb

## 2014-12-16 DIAGNOSIS — B20 Human immunodeficiency virus [HIV] disease: Secondary | ICD-10-CM

## 2014-12-16 DIAGNOSIS — J449 Chronic obstructive pulmonary disease, unspecified: Secondary | ICD-10-CM

## 2014-12-16 DIAGNOSIS — Z79899 Other long term (current) drug therapy: Secondary | ICD-10-CM | POA: Diagnosis not present

## 2014-12-16 DIAGNOSIS — D509 Iron deficiency anemia, unspecified: Secondary | ICD-10-CM | POA: Diagnosis not present

## 2014-12-16 DIAGNOSIS — Z113 Encounter for screening for infections with a predominantly sexual mode of transmission: Secondary | ICD-10-CM | POA: Diagnosis not present

## 2014-12-16 MED ORDER — HYDROXYZINE HCL 25 MG PO TABS: 25.0000 mg | ORAL_TABLET | Freq: Four times a day (QID) | ORAL | Status: DC | PRN

## 2014-12-16 NOTE — Progress Notes (Signed)
   Subjective:    Patient ID: Casey Brennan, male    DOB: Aug 31, 1960, 54 y.o.   MRN: [id number]  HPI 54 yo M with hx HIV+ and prostate Ca. Currently taking ISN/TRV/DRVr.  Had radical prostatectomy 08-18-13, adenoCA, LN (-), margins (-).  Has been feeling well.  Had R knee arthroscopy 1985, has been having pain recently. Has had MRi and has severe osteoarthritis.  Had cholecystectomy 11-06-14 and afterwards he had COPD exacerbation (COPD gold) adm 5-19 to 5-21. 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  Still having night sweats. Wt has been slowly dropping- trying to get under 200 (at 203).  No problems with ART. Got recent steroid burst.   Review of Systems  Constitutional: Negative for fever, chills and unexpected weight change.  Respiratory: Negative for cough and shortness of breath.   Gastrointestinal: Negative for diarrhea and constipation.  Genitourinary: Negative for difficulty urinating.      Objective:   Physical Exam  Constitutional: He appears well-developed and well-nourished.  HENT:  Mouth/Throat: No oropharyngeal exudate.  Eyes: EOM are normal. Pupils are equal, round, and reactive to light.  Neck: Neck supple.  Cardiovascular: Normal rate, regular rhythm and normal heart sounds.   Pulmonary/Chest: Effort normal and breath sounds normal.  Abdominal: Soft. Bowel sounds are normal. There is no tenderness. There is no rebound.  Lymphadenopathy:    He has no cervical adenopathy.      Assessment & Plan:

## 2014-12-16 NOTE — Assessment & Plan Note (Signed)
He appears to be doing well.  His sister asks about repeat PE w/u. I deffered this as he has had 2 negative scans in the last 3 weeks.

## 2014-12-16 NOTE — Assessment & Plan Note (Signed)
He is doing very well. No change in ART for now.  Offered/refused condoms.  Will see him back in 6 months with labs prior.

## 2014-12-16 NOTE — Assessment & Plan Note (Signed)
Will have him seen by heme, some concern about night sweats, anemia. His recent CT scan did not show LAN.

## 2014-12-18 ENCOUNTER — Telehealth: Payer: Self-pay | Admitting: Internal Medicine

## 2014-12-18 NOTE — Telephone Encounter (Signed)
LM for patient to return call x 1

## 2014-12-22 ENCOUNTER — Telehealth: Payer: Self-pay | Admitting: Oncology

## 2014-12-22 NOTE — Telephone Encounter (Signed)
new patient appt-s/w patient sister April and gave np appt for 07/21 @ 10:30 w/Dr. Alen Blew Referring Dr. Bobby Rumpf Dx- Hypochromic microcytic anemia

## 2014-12-22 NOTE — Telephone Encounter (Signed)
LMTCB x2 for pt 

## 2014-12-23 NOTE — Telephone Encounter (Signed)
Left message for patient to call back  Left message on voicemail for Casey Brennan advising her that we have been unsuccessful in reaching patient.  Dr. Melvyn Novas - patient requesting to see another provider.  Ok to schedule patient with another provider?

## 2014-12-23 NOTE — Telephone Encounter (Signed)
Fine with me - not clear at all what the problem is but there is documentation of unusual affect in exam portion of note-  I  can't shed light on that as he did not return in 2 weeks as rec if not doing better and hfa was suboptimal

## 2014-12-23 NOTE — Telephone Encounter (Signed)
Received call back from [place].. Dr. Lamonte Sakai has opening on July 29th.  Patient would like to switch from Dr. Melvyn Novas to Dr. Lamonte Sakai.    Dr. Lamonte Sakai, ok to switch? Dr. Melvyn Novas, ok to Switch?  Will call Kennyth Lose back to schedule appointment once we have confirmation from both doctors.

## 2014-12-23 NOTE — Telephone Encounter (Signed)
Dr. Lamonte Sakai, it is fine with Dr. Melvyn Novas to switch providers, ok with you?  Please advise.

## 2014-12-24 NOTE — Telephone Encounter (Signed)
OK with me.

## 2014-12-24 NOTE — Telephone Encounter (Addendum)
Called and spoke to pt's sister, April. Appt made with RB on 7/29. April verbalized understanding and denied any further questions or concerns at this time. Called an spoke to [place] at Dr. Algis Downs office and informed her the pt's upcoming appt. Nothing further needed at this time.

## 2015-01-16 ENCOUNTER — Other Ambulatory Visit: Payer: Self-pay | Admitting: Nurse Practitioner

## 2015-01-16 ENCOUNTER — Other Ambulatory Visit: Payer: Self-pay | Admitting: Internal Medicine

## 2015-01-18 ENCOUNTER — Encounter: Payer: Self-pay | Admitting: Internal Medicine

## 2015-01-18 ENCOUNTER — Ambulatory Visit (INDEPENDENT_AMBULATORY_CARE_PROVIDER_SITE_OTHER): Payer: Medicare Other | Admitting: Internal Medicine

## 2015-01-18 VITALS — BP 146/90 | HR 82 | Temp 98.2°F | Ht 69.0 in | Wt 213.1 lb

## 2015-01-18 DIAGNOSIS — J449 Chronic obstructive pulmonary disease, unspecified: Secondary | ICD-10-CM

## 2015-01-18 DIAGNOSIS — R05 Cough: Secondary | ICD-10-CM

## 2015-01-18 DIAGNOSIS — L739 Follicular disorder, unspecified: Secondary | ICD-10-CM | POA: Diagnosis not present

## 2015-01-18 DIAGNOSIS — R059 Cough, unspecified: Secondary | ICD-10-CM

## 2015-01-18 MED ORDER — HYDROCOD POLST-CPM POLST ER 10-8 MG/5ML PO SUER: 5.0000 mL | Freq: Two times a day (BID) | ORAL | Status: DC | PRN

## 2015-01-18 MED ORDER — DOXYCYCLINE HYCLATE 100 MG PO CAPS: 100.0000 mg | ORAL_CAPSULE | Freq: Two times a day (BID) | ORAL | Status: DC

## 2015-01-18 NOTE — Patient Instructions (Signed)
1. Please take the doxycycline twice per day for the next 7 days.  Come back to clinic or see Dr. [name] if the rash does not improve with antibiotic.  I think your cough will continue to improve.  Please see [place] again if you develop new symptoms.    2. Please take all medications as prescribed.    3. If you have worsening of your symptoms or new symptoms arise, please call the clinic ([phone]), or go to the ER immediately if symptoms are severe.

## 2015-01-18 NOTE — Progress Notes (Addendum)
Subjective:    Patient ID: Casey Brennan, male    DOB: 08-11-1960, 54 y.o.   MRN: [id number]  HPI Comments: Mr. Griffie is a 54 year old male with PMH as below here with c/o cough.  Please see problem based charting for assessment and plan.  Cough This is a new problem. The current episode started 1 to 4 weeks ago. The problem has been unchanged. The cough is productive of sputum. Associated symptoms include ear pain, postnasal drip, a rash, rhinorrhea, sweats and wheezing. Pertinent negatives include no chills, fever, hemoptysis, sore throat or shortness of breath. He has tried prescription cough suppressant, a beta-agonist inhaler and OTC cough suppressant for the symptoms. The treatment provided moderate relief. His past medical history is significant for asthma, environmental allergies and pneumonia.  Sinus Problem This is a new problem. The current episode started 1 to 4 weeks ago. The problem has been gradually improving since onset. There has been no fever. His pain is at a severity of 0/10. He is experiencing no pain. Associated symptoms include coughing and ear pain. Pertinent negatives include no chills, shortness of breath or sore throat. The treatment provided moderate relief.  Rash This is a new problem. The current episode started 1 to 4 weeks ago. The problem has been gradually improving since onset. The affected locations include the chest and head. The rash is characterized by itchiness. Associated symptoms include coughing and rhinorrhea. Pertinent negatives include no fever, shortness of breath or sore throat. Past treatments include antihistamine and anti-itch cream. The treatment provided mild relief. His past medical history is significant for allergies and asthma. There is no history of eczema.     Past Medical History  Diagnosis Date  . HIV (human immunodeficiency virus infection) 1988  . Chronic anemia     normocytic  . Chronic folliculitis   . Gross hematuria   .  Elevated PSA   . H/O pericarditis     2010--  myopercarditis--  resolved  . History of concussion     2012  &  2013  RESIDUAL HA'S --  RESOLVED  . Axillary lymphadenopathy   . History of MRSA infection 2010    infected boil  . History of gastric ulcer   . Lytic bone lesion of hip     WORK-UP DONE BY ONCOLOGIST DR HA --  NOT MALIGNANT  . Wears glasses   . Headache(784.0)     HX SEVERE FRONTAL HA'S  . Ulcer     hx of gastric  . Asthma     very rare  . Arthritis     r knee   . GERD (gastroesophageal reflux disease)   . Bronchitis   . Post concussion syndrome     resolved  . Prostate cancer 04/25/13    gleason 3+3=6, volume 30 gm  . Environmental and seasonal allergies   . Pneumonia     hx of  . HCAP (healthcare-associated pneumonia) 11/19/2014  . Kidney stones    Current Outpatient Prescriptions on File Prior to Visit  Medication Sig Dispense Refill  . albuterol (PROVENTIL HFA;VENTOLIN HFA) 108 (90 BASE) MCG/ACT inhaler Inhale 2 puffs into the lungs every 4 (four) hours as needed for wheezing or shortness of breath. 1 Inhaler 3  . albuterol (PROVENTIL) (2.5 MG/3ML) 0.083% nebulizer solution Take 3 mLs (2.5 mg total) by nebulization every 6 (six) hours as needed for wheezing or shortness of breath. 75 mL 12  . aspirin EC 81 MG tablet Take  81 mg by mouth 3 (three) times a week. Monday, Tuesday and Wednesday    . Beclomethasone Dipropionate (QNASL) 80 MCG/ACT AERS Place 1 spray into the nose 2 (two) times daily. 1 Inhaler 0  . bisacodyl (DULCOLAX) 5 MG EC tablet Take 1 tablet (5 mg total) by mouth daily as needed for moderate constipation. 30 tablet 0  . chlorpheniramine-HYDROcodone (TUSSIONEX) 10-8 MG/5ML SUER Take 5 mLs by mouth every 12 (twelve) hours as needed for cough. 115 mL 0  . ferrous sulfate 325 (65 FE) MG tablet Take 1 tablet (325 mg total) by mouth daily with breakfast. 30 tablet 3  . gabapentin (NEURONTIN) 300 MG capsule Take 1 capsule (300 mg total) by mouth 4 (four)  times daily as needed (nerve pain). 120 capsule 7  . hydrOXYzine (ATARAX/VISTARIL) 25 MG tablet Take 1 tablet (25 mg total) by mouth every 6 (six) hours as needed for anxiety. 30 tablet 2  . ipratropium (ATROVENT) 0.02 % nebulizer solution Take 2.5 mLs (0.5 mg total) by nebulization 3 (three) times daily as needed for wheezing or shortness of breath. Mix with xopenex 75 mL 1  . loratadine (CLARITIN) 10 MG tablet Take 10 mg by mouth daily.     . mometasone-formoterol (DULERA) 200-5 MCG/ACT AERO Take 2 puffs first thing in am and then another 2 puffs about 12 hours later. 1 Inhaler 11  . Multiple Vitamin (MULTIVITAMIN WITH MINERALS) TABS tablet Take 1 tablet by mouth at bedtime.    . mupirocin ointment (BACTROBAN) 2 % APPLY TO THE AFFECTED AREA TWICE DAILY FOR 2 WEEKS. PLACE COTTON BETWEEN TOES TO ALLOW BETTER AERATION (Patient taking differently: APPLY TO THE AFFECTED AREA TWICE DAILY FOR 2 WEEKS. PLACE COTTON BETWEEN TOES TO ALLOW BETTER AERATION PRN) 66 g 0  . NORVIR 100 MG TABS tablet TAKE 1 TABLET BY MOUTH EVERY DAY WITH PREZISTA AND TRUVADA 30 tablet 6  . omeprazole (PRILOSEC) 40 MG capsule Take 40 mg by mouth at bedtime.    . ondansetron (ZOFRAN) 4 MG tablet Take 1 tablet (4 mg total) by mouth every 6 (six) hours. 12 tablet 0  . Polyvinyl Alcohol-Povidone (REFRESH OP) Place 1 drop into both eyes at bedtime.    [name] PREZISTA 800 MG tablet TAKE 1 TABLET BY MOUTH EVERY DAY 30 tablet 6  . raltegravir (ISENTRESS) 400 MG tablet Take 800 mg by mouth at bedtime.    . tizanidine (ZANAFLEX) 6 MG capsule Take 1 capsule (6 mg total) by mouth 3 (three) times daily as needed for muscle spasms. 270 capsule 2  . traZODone (DESYREL) 50 MG tablet TAKE 1 TABLET BY MOUTH AT BEDTIME (Patient taking differently: TAKE 1 TABLET BY MOUTH AT BEDTIME AS NEEDED) 30 tablet 3  . TRUVADA 200-300 MG per tablet TAKE 1 TABLET BY MOUTH EVERY DAY WITH PREZISTA AND NORVIR 30 tablet 6  . VOLTAREN 1 % GEL APPLY TO THE AFFECTED AREA  TWICE DAILY AS NEEDED (Patient taking differently: APPLY TO THE AFFECTED AREA TWICE DAILY AS NEEDED FOR PAIN.) 100 g 0  . zolpidem (AMBIEN CR) 12.5 MG CR tablet Take 1 tablet (12.5 mg total) by mouth at bedtime as needed for sleep. 30 tablet 1   No current facility-administered medications on file prior to visit.    Review of Systems  Constitutional: Negative for fever, chills and appetite change.  HENT: Positive for ear pain, postnasal drip and rhinorrhea. Negative for sore throat.   Respiratory: Positive for cough and wheezing. Negative for hemoptysis and shortness  of breath.   Gastrointestinal: Negative for abdominal pain.  Skin: Positive for rash.  Allergic/Immunologic: Positive for environmental allergies.       Filed Vitals:   01/18/15 1408  BP: 146/90  Pulse: 82  Temp: 98.2 F (36.8 C)  TempSrc: Oral  Height: 5\' 9"  (1.753 m)  Weight: 213 lb 1.6 oz (96.662 kg)  SpO2: 100%     Objective:   Physical Exam  Constitutional: He is oriented to person, place, and time. He appears well-developed. No distress.  HENT:  Head: Normocephalic and atraumatic.  Mouth/Throat: Oropharynx is clear and moist. No oropharyngeal exudate.  Eyes: EOM are normal. Pupils are equal, round, and reactive to light. Right eye exhibits no discharge. Left eye exhibits no discharge.  There is no sinus tenderness.  Neck: Neck supple. No thyromegaly present.  Cardiovascular: Normal rate, regular rhythm and normal heart sounds.  Exam reveals no gallop and no friction rub.   No murmur heard. Pulmonary/Chest: Effort normal and breath sounds normal. No respiratory distress. He has no wheezes. He has no rales.  Abdominal: Soft. Bowel sounds are normal. He exhibits no distension and no mass. There is no tenderness. There is no rebound and no guarding.  Well healed surgical scars.  Musculoskeletal: Normal range of motion. He exhibits no edema or tenderness.  Lymphadenopathy:    He has no cervical adenopathy.    Neurological: He is alert and oriented to person, place, and time. No cranial nerve deficit.  Skin: Skin is warm. Rash noted. He is not diaphoretic.  Small bumps on the chest and forehead, some with whiteheads.  Psychiatric: He has a normal mood and affect. His behavior is normal. Judgment and thought content normal.  Vitals reviewed.         Assessment & Plan:  Please see problem based charting for assessment and plan.

## 2015-01-18 NOTE — Assessment & Plan Note (Signed)
Small pustules and papules on the forehead (reportedly improving) and chest) x 2 weeks.  Itchy, not painful.  His sister says they ooze pus when popped and he feels relief.  No new environmental exposures, no hot tubs or pools, no constrictive wear (occasional hats).  He has never had this before. - rx for doxycycline 100mg  BID x 7 days (systemic instead of topical treatment because more than one area is affected and due to immune status) - return to [name] or ID if no improvement with above treatment

## 2015-01-18 NOTE — Assessment & Plan Note (Addendum)
Productive cough of clear sputum since pneumonia two months ago.  Other symptoms resolved and overall feels he is gradually improving.  Cough had gone away but returned two weeks ago.  Tussionex helps but he ran out.  No dyspnea, afebrile, unremarkable exam. Follow-up CXR at the end of May showed improvement.  He has hx of asthma (which may be contributing) but no increased inhaler use.  Sister reports night sweats are decreasing.  ID is sending him heme/onc for further evaluation of night sweats.   - rx for Tussionex for cough - continue allergy and asthma medications - I doubt infectious etiology but he is getting rx doxy today for folliculitis which would cover for bacterial rhinosinusitis

## 2015-01-21 ENCOUNTER — Encounter: Payer: Self-pay | Admitting: Hematology and Oncology

## 2015-01-21 ENCOUNTER — Ambulatory Visit: Payer: Medicare Other | Admitting: Oncology

## 2015-01-21 ENCOUNTER — Ambulatory Visit (HOSPITAL_BASED_OUTPATIENT_CLINIC_OR_DEPARTMENT_OTHER): Payer: Medicare Other

## 2015-01-21 ENCOUNTER — Other Ambulatory Visit: Payer: Medicare Other

## 2015-01-21 ENCOUNTER — Ambulatory Visit (HOSPITAL_BASED_OUTPATIENT_CLINIC_OR_DEPARTMENT_OTHER): Payer: Medicare Other | Admitting: Hematology and Oncology

## 2015-01-21 ENCOUNTER — Ambulatory Visit: Payer: Medicare Other

## 2015-01-21 ENCOUNTER — Telehealth: Payer: Self-pay | Admitting: Hematology and Oncology

## 2015-01-21 VITALS — BP 144/89 | HR 97 | Temp 98.1°F | Resp 20 | Ht 69.0 in | Wt 208.0 lb

## 2015-01-21 DIAGNOSIS — B20 Human immunodeficiency virus [HIV] disease: Secondary | ICD-10-CM

## 2015-01-21 DIAGNOSIS — D509 Iron deficiency anemia, unspecified: Secondary | ICD-10-CM

## 2015-01-21 DIAGNOSIS — R61 Generalized hyperhidrosis: Secondary | ICD-10-CM | POA: Diagnosis not present

## 2015-01-21 DIAGNOSIS — M898X5 Other specified disorders of bone, thigh: Secondary | ICD-10-CM

## 2015-01-21 DIAGNOSIS — Z8546 Personal history of malignant neoplasm of prostate: Secondary | ICD-10-CM | POA: Diagnosis not present

## 2015-01-21 DIAGNOSIS — M899 Disorder of bone, unspecified: Secondary | ICD-10-CM | POA: Diagnosis not present

## 2015-01-21 LAB — CBC & DIFF AND RETIC
BASO%: 0.7 % (ref 0.0–2.0)
Basophils Absolute: 0.1 10*3/uL (ref 0.0–0.1)
EOS%: 9.9 % — ABNORMAL HIGH (ref 0.0–7.0)
Eosinophils Absolute: 0.7 10*3/uL — ABNORMAL HIGH (ref 0.0–0.5)
HCT: 38.5 % (ref 38.4–49.9)
HGB: 12 g/dL — ABNORMAL LOW (ref 13.0–17.1)
Immature Retic Fract: 19 % — ABNORMAL HIGH (ref 3.00–10.60)
LYMPH%: 32 % (ref 14.0–49.0)
MCH: 25.3 pg — ABNORMAL LOW (ref 27.2–33.4)
MCHC: 31.2 g/dL — ABNORMAL LOW (ref 32.0–36.0)
MCV: 81.1 fL (ref 79.3–98.0)
MONO#: 0.5 10*3/uL (ref 0.1–0.9)
MONO%: 7.6 % (ref 0.0–14.0)
NEUT#: 3.5 10*3/uL (ref 1.5–6.5)
NEUT%: 49.8 % (ref 39.0–75.0)
Platelets: 177 10*3/uL (ref 140–400)
RBC: 4.75 10*6/uL (ref 4.20–5.82)
RDW: 23.8 % — ABNORMAL HIGH (ref 11.0–14.6)
Retic %: 2.13 % — ABNORMAL HIGH (ref 0.80–1.80)
Retic Ct Abs: 101.18 10*3/uL — ABNORMAL HIGH (ref 34.80–93.90)
WBC: 7.1 10*3/uL (ref 4.0–10.3)
lymph#: 2.3 10*3/uL (ref 0.9–3.3)

## 2015-01-21 LAB — IRON AND TIBC CHCC
%SAT: 21 % (ref 20–55)
Iron: 62 ug/dL (ref 42–163)
TIBC: 300 ug/dL (ref 202–409)
UIBC: 238 ug/dL (ref 117–376)

## 2015-01-21 LAB — FERRITIN CHCC: Ferritin: 28 ng/ml (ref 22–316)

## 2015-01-21 NOTE — Progress Notes (Signed)
[facility] OFFICE PROGRESS NOTE  Casey Brennan, Carly, MD SUMMARY OF HEMATOLOGIC HISTORY: Abnormal lytic lesion, for further management, and background history of HIV infection  SUMMARY OF HEMATOLOGIC HISTORY: This is a gentleman accompanied by his sister, April today. I reviewed his records and collaborated the history with the patient. He had background history of HIV infection and recurrent infection in the past. Recently, he started to have gross hematuria. CT scan of the abdomen and pelvis revealed abnormal lytic lesions leading to multiple workup. Biopsy was negative. Workup for myeloma was negative. He has lymphadenopathy in the left axilla which subsequently resolved. The patient was subsequently found to have prostate cancer and underwent prostatectomy without the need for adjuvant treatment. He had EGD and colonoscopy performed in January 2016 for anemia and was found to have mild gastritis INTERVAL HISTORY: Casey Brennan 54 y.o. male returns for further follow-up. He has been complaining of fatigue, PICA, and weakness. The patient has been taking Aleve on a regular basis for knee pain. The patient denies any recent signs or symptoms of bleeding such as spontaneous epistaxis, hematuria or hematochezia. Denies lymphadenopathy.  I have reviewed the past medical history, past surgical history, social history and family history with the patient and they are unchanged from previous note.  ALLERGIES:  is allergic to morphine and related; penicillins; sulfonamide derivatives; tramadol; and tylenol.  MEDICATIONS:  Current Outpatient Prescriptions  Medication Sig Dispense Refill  . albuterol (PROVENTIL HFA;VENTOLIN HFA) 108 (90 BASE) MCG/ACT inhaler Inhale 2 puffs into the lungs every 4 (four) hours as needed for wheezing or shortness of breath. 1 Inhaler 3  . albuterol (PROVENTIL) (2.5 MG/3ML) 0.083% nebulizer solution Take 3 mLs (2.5 mg total) by nebulization every 6 (six)  hours as needed for wheezing or shortness of breath. 75 mL 12  . aspirin EC 81 MG tablet Take 81 mg by mouth 3 (three) times a week. Monday, Tuesday and Wednesday    . Beclomethasone Dipropionate (QNASL) 80 MCG/ACT AERS Place 1 spray into the nose 2 (two) times daily. 1 Inhaler 0  . bisacodyl (DULCOLAX) 5 MG EC tablet Take 1 tablet (5 mg total) by mouth daily as needed for moderate constipation. 30 tablet 0  . chlorpheniramine-HYDROcodone (TUSSIONEX) 10-8 MG/5ML SUER Take 5 mLs by mouth every 12 (twelve) hours as needed for cough. 115 mL 0  . doxycycline (VIBRAMYCIN) 100 MG capsule Take 1 capsule (100 mg total) by mouth 2 (two) times daily. 14 capsule 0  . ferrous sulfate 325 (65 FE) MG tablet Take 1 tablet (325 mg total) by mouth daily with breakfast. 30 tablet 3  . gabapentin (NEURONTIN) 300 MG capsule Take 1 capsule (300 mg total) by mouth 4 (four) times daily as needed (nerve pain). 120 capsule 7  . hydrOXYzine (ATARAX/VISTARIL) 25 MG tablet Take 1 tablet (25 mg total) by mouth every 6 (six) hours as needed for anxiety. 30 tablet 2  . ipratropium (ATROVENT) 0.02 % nebulizer solution Take 2.5 mLs (0.5 mg total) by nebulization 3 (three) times daily as needed for wheezing or shortness of breath. Mix with xopenex 75 mL 1  . loratadine (CLARITIN) 10 MG tablet Take 10 mg by mouth daily.     . mometasone-formoterol (DULERA) 200-5 MCG/ACT AERO Take 2 puffs first thing in am and then another 2 puffs about 12 hours later. 1 Inhaler 11  . Multiple Vitamin (MULTIVITAMIN WITH MINERALS) TABS tablet Take 1 tablet by mouth at bedtime.    . NORVIR 100 MG  TABS tablet TAKE 1 TABLET BY MOUTH EVERY DAY WITH PREZISTA AND TRUVADA 30 tablet 6  . omeprazole (PRILOSEC) 40 MG capsule Take 40 mg by mouth at bedtime.    . ondansetron (ZOFRAN) 4 MG tablet Take 1 tablet (4 mg total) by mouth every 6 (six) hours. 12 tablet 0  . PREZISTA 800 MG tablet TAKE 1 TABLET BY MOUTH EVERY DAY 30 tablet 6  . raltegravir (ISENTRESS) 400  MG tablet Take 800 mg by mouth at bedtime.    . tizanidine (ZANAFLEX) 6 MG capsule Take 1 capsule (6 mg total) by mouth 3 (three) times daily as needed for muscle spasms. 270 capsule 2  . traZODone (DESYREL) 50 MG tablet TAKE 1 TABLET BY MOUTH AT BEDTIME 90 tablet 3  . TRUVADA 200-300 MG per tablet TAKE 1 TABLET BY MOUTH EVERY DAY WITH PREZISTA AND NORVIR 30 tablet 6  . VOLTAREN 1 % GEL APPLY TO THE AFFECTED AREA TWICE DAILY AS NEEDED (Patient taking differently: APPLY TO THE AFFECTED AREA TWICE DAILY AS NEEDED FOR PAIN.) 100 g 0  . zolpidem (AMBIEN CR) 12.5 MG CR tablet Take 1 tablet (12.5 mg total) by mouth at bedtime as needed for sleep. 30 tablet 1  . mupirocin ointment (BACTROBAN) 2 % APPLY TO THE AFFECTED AREA TWICE DAILY FOR 2 WEEKS. PLACE COTTON BETWEEN TOES TO ALLOW BETTER AERATION (Patient taking differently: APPLY TO THE AFFECTED AREA TWICE DAILY FOR 2 WEEKS. PLACE COTTON BETWEEN TOES TO ALLOW BETTER AERATION PRN) 66 g 0  . Polyvinyl Alcohol-Povidone (REFRESH OP) Place 1 drop into both eyes at bedtime.     No current facility-administered medications for this visit.     REVIEW OF SYSTEMS:   Constitutional: Denies fevers, chills or night sweats Eyes: Denies blurriness of vision Ears, nose, mouth, throat, and face: Denies mucositis or sore throat Respiratory: Denies cough, dyspnea or wheezes Cardiovascular: Denies palpitation, chest discomfort or lower extremity swelling Gastrointestinal:  Denies nausea, heartburn or change in bowel habits Skin: Denies abnormal skin rashes Lymphatics: Denies new lymphadenopathy or easy bruising Neurological:Denies numbness, tingling or new weaknesses Behavioral/Psych: Mood is stable, no new changes  All other systems were reviewed with the patient and are negative.  PHYSICAL EXAMINATION: ECOG PERFORMANCE STATUS: 1 - Symptomatic but completely ambulatory  Filed Vitals:   01/21/15 1055  BP: 144/89  Pulse: 97  Temp: 98.1 F (36.7 C)  Resp: 20    Filed Weights   01/21/15 1055  Weight: 208 lb (94.348 kg)    GENERAL:alert, no distress and comfortable SKIN: skin color, texture, turgor are normal, no rashes or significant lesions EYES: normal, Conjunctiva are pink and non-injected, sclera clear OROPHARYNX:no exudate, no erythema and lips, buccal mucosa, and tongue normal  NECK: supple, thyroid normal size, non-tender, without nodularity LYMPH:  no palpable lymphadenopathy in the cervical, axillary or inguinal LUNGS: clear to auscultation and percussion with normal breathing effort HEART: regular rate & rhythm and no murmurs and no lower extremity edema ABDOMEN:abdomen soft, non-tender and normal bowel sounds Musculoskeletal:no cyanosis of digits and no clubbing  NEURO: alert & oriented x 3 with fluent speech, no focal motor/sensory deficits  LABORATORY DATA:  I have reviewed the data as listed No results found for this or any previous visit (from the past 48 hour(s)).  Lab Results  Component Value Date   WBC 7.1 01/21/2015   HGB 12.0* 01/21/2015   HCT 38.5 01/21/2015   MCV 81.1 01/21/2015   PLT 177 01/21/2015   I reviewed  some of his recent CT scan which showed no lymphadenopathy ASSESSMENT & PLAN:  Hypochromic microcytic anemia The most likely cause of the microcytic anemia is related to GI bleeding until proven otherwise. His EGD from January 2016 showed gastritis. The patient has been taking a lot of Aleve for joint pain. I recommend he avoid NSAID completely. He is doing very well with iron supplement and his hemoglobin has improved significantly. I recommend he continues the same.  Human immunodeficiency virus (HIV) disease he will continue current medical management. I recommend close follow-up with his infectious disease doctor for medication adjustment.   Lytic bone lesion of hip The patient had lytic lesion in the past. Serum protein electrophoresis in the past did not detect any M spike. I will order  repeat blood work to ensure that he does not have MGUS/myeloma  H/O prostate cancer The patient have treated prostate cancer. He will continue urology follow-up. He did not require adjuvant treatment  Unexplained night sweats He has unexplained night sweats. I'm not sure whether this could be due to testosterone deficiency in view of prior surgery for prostate cancer. According to the patient it is improving. I will order blood tests to exclude multiple myeloma   All questions were answered. The patient knows to call the clinic with any problems, questions or concerns. No barriers to learning was detected.  I spent 25 minutes counseling the patient face to face. The total time spent in the appointment was 30 minutes and more than 50% was on counseling.     [facility], Dulcinea Kinser, MD 7/21/20161:14 PM

## 2015-01-21 NOTE — Assessment & Plan Note (Signed)
The most likely cause of the microcytic anemia is related to GI bleeding until proven otherwise. His EGD from January 2016 showed gastritis. The patient has been taking a lot of Aleve for joint pain. I recommend he avoid NSAID completely. He is doing very well with iron supplement and his hemoglobin has improved significantly. I recommend he continues the same.

## 2015-01-21 NOTE — Assessment & Plan Note (Signed)
He has unexplained night sweats. I'm not sure whether this could be due to testosterone deficiency in view of prior surgery for prostate cancer. According to the patient it is improving. I will order blood tests to exclude multiple myeloma

## 2015-01-21 NOTE — Progress Notes (Signed)
Checked in new pt with no financial concerns prior to seeing the dr.  Pt has 2 insurances so financial assistance may not be needed but he has my card for any billing questions or concerns.  °

## 2015-01-21 NOTE — Assessment & Plan Note (Signed)
The patient have treated prostate cancer. He will continue urology follow-up. He did not require adjuvant treatment

## 2015-01-21 NOTE — Assessment & Plan Note (Signed)
he will continue current medical management. I recommend close follow-up with his infectious disease doctor for medication adjustment.

## 2015-01-21 NOTE — Assessment & Plan Note (Signed)
The patient had lytic lesion in the past. Serum protein electrophoresis in the past did not detect any M spike. I will order repeat blood work to ensure that he does not have MGUS/myeloma

## 2015-01-21 NOTE — Telephone Encounter (Signed)
per pof to sch pt appt-gave pt copy of avs-sent pt back to labs

## 2015-01-22 ENCOUNTER — Other Ambulatory Visit: Payer: Self-pay | Admitting: Infectious Diseases

## 2015-01-22 LAB — SEDIMENTATION RATE: Sed Rate: 1 mm/hr (ref 0–20)

## 2015-01-23 NOTE — Progress Notes (Signed)
Internal Medicine Clinic Attending  Case discussed with Dr. Wilson soon after the resident saw the patient.  We reviewed the resident's history and exam and pertinent patient test results.  I agree with the assessment, diagnosis, and plan of care documented in the resident's note.  

## 2015-01-28 ENCOUNTER — Ambulatory Visit (HOSPITAL_COMMUNITY): Payer: Medicare Other | Attending: Hematology and Oncology

## 2015-01-29 ENCOUNTER — Institutional Professional Consult (permissible substitution): Payer: Medicare Other | Admitting: Emergency Medicine

## 2015-02-04 ENCOUNTER — Ambulatory Visit: Payer: Medicare Other | Admitting: Hematology and Oncology

## 2015-02-04 ENCOUNTER — Ambulatory Visit: Payer: Medicare Other

## 2015-02-04 ENCOUNTER — Other Ambulatory Visit: Payer: Self-pay | Admitting: Hematology and Oncology

## 2015-02-04 ENCOUNTER — Other Ambulatory Visit: Payer: Self-pay | Admitting: Infectious Diseases

## 2015-02-05 ENCOUNTER — Other Ambulatory Visit: Payer: Self-pay | Admitting: *Deleted

## 2015-02-05 DIAGNOSIS — M25569 Pain in unspecified knee: Secondary | ICD-10-CM

## 2015-02-05 MED ORDER — DICLOFENAC SODIUM 1 % TD GEL: TRANSDERMAL | Status: DC

## 2015-02-08 ENCOUNTER — Telehealth: Payer: Self-pay | Admitting: Hematology and Oncology

## 2015-02-08 ENCOUNTER — Ambulatory Visit (HOSPITAL_COMMUNITY)
Admission: RE | Admit: 2015-02-08 | Discharge: 2015-02-08 | Disposition: A | Discharge status: Home / Self Care | Payer: Medicare Other | Source: Ambulatory Visit | Attending: Hematology and Oncology | Admitting: Hematology and Oncology

## 2015-02-08 DIAGNOSIS — B20 Human immunodeficiency virus [HIV] disease: Secondary | ICD-10-CM | POA: Insufficient documentation

## 2015-02-08 DIAGNOSIS — M899 Disorder of bone, unspecified: Secondary | ICD-10-CM | POA: Diagnosis not present

## 2015-02-08 DIAGNOSIS — M898X5 Other specified disorders of bone, thigh: Secondary | ICD-10-CM

## 2015-02-08 NOTE — Telephone Encounter (Signed)
Pt confirmed MD visit per 08/08 POF, gave pt  Calendar..... KJ

## 2015-02-09 ENCOUNTER — Telehealth: Payer: Self-pay | Admitting: Hematology and Oncology

## 2015-02-09 ENCOUNTER — Ambulatory Visit (HOSPITAL_BASED_OUTPATIENT_CLINIC_OR_DEPARTMENT_OTHER): Payer: Medicare Other | Admitting: Hematology and Oncology

## 2015-02-09 ENCOUNTER — Ambulatory Visit: Payer: Medicare Other | Admitting: Hematology and Oncology

## 2015-02-09 ENCOUNTER — Encounter: Payer: Self-pay | Admitting: Hematology and Oncology

## 2015-02-09 ENCOUNTER — Other Ambulatory Visit: Payer: Self-pay | Admitting: Hematology and Oncology

## 2015-02-09 VITALS — BP 133/87 | HR 95 | Temp 98.4°F | Resp 18 | Ht 69.0 in | Wt 204.4 lb

## 2015-02-09 DIAGNOSIS — B20 Human immunodeficiency virus [HIV] disease: Secondary | ICD-10-CM | POA: Diagnosis not present

## 2015-02-09 DIAGNOSIS — D509 Iron deficiency anemia, unspecified: Secondary | ICD-10-CM

## 2015-02-09 DIAGNOSIS — M898X5 Other specified disorders of bone, thigh: Secondary | ICD-10-CM

## 2015-02-09 DIAGNOSIS — M899 Disorder of bone, unspecified: Secondary | ICD-10-CM | POA: Diagnosis not present

## 2015-02-09 NOTE — Assessment & Plan Note (Addendum)
The most likely cause of the microcytic anemia is related to GI bleeding until proven otherwise. His EGD from January 2016 showed gastritis. The patient has been taking a lot of Aleve for joint pain. I recommend he avoid NSAID completely. He is doing very well with iron supplement and his hemoglobin has improved significantly. I recommend he continues the same. I plan to recheck CBC and iron studies in 3 months.

## 2015-02-09 NOTE — Telephone Encounter (Signed)
Appointments made and avs printed °

## 2015-02-09 NOTE — Progress Notes (Signed)
[place] OFFICE PROGRESS NOTE  Patient Care Team: Juliet Rude, MD as PCP - General Campbell Riches, MD as PCP - Infectious Diseases (Infectious Diseases) Bjorn Loser, MD as Attending Physician (Urology) Wilford Corner, MD as Attending Physician (Gastroenterology) Rolm Bookbinder, MD as Attending Physician (General Surgery) Elmarie Shiley, MD (Nephrology) Nobie Putnam, MD (Hematology and Oncology)  SUMMARY OF ONCOLOGIC HISTORY:  This is a gentleman accompanied by his sister, April today. I reviewed his records and collaborated the history with the patient. He had background history of HIV infection and recurrent infection in the past. Recently, he started to have gross hematuria. CT scan of the abdomen and pelvis revealed abnormal lytic lesions leading to multiple workup. Biopsy was negative. Workup for myeloma was negative. He has lymphadenopathy in the left axilla which subsequently resolved. The patient was subsequently found to have prostate cancer and underwent prostatectomy without the need for adjuvant treatment. He had EGD and colonoscopy performed in January 2016 for anemia and was found to have mild gastritis. He takes iron supplement and is doing well.  INTERVAL HISTORY: Please see below for problem oriented charting. He denies new symptoms about on chronic right knee pain related to prior trauma.  REVIEW OF SYSTEMS:   Constitutional: Denies fevers, chills or abnormal weight loss Eyes: Denies blurriness of vision Ears, nose, mouth, throat, and face: Denies mucositis or sore throat Respiratory: Denies cough, dyspnea or wheezes Cardiovascular: Denies palpitation, chest discomfort or lower extremity swelling Gastrointestinal:  Denies nausea, heartburn or change in bowel habits Skin: Denies abnormal skin rashes Lymphatics: Denies new lymphadenopathy or easy bruising Neurological:Denies numbness, tingling or new weaknesses Behavioral/Psych: Mood is stable,  no new changes  All other systems were reviewed with the patient and are negative.  I have reviewed the past medical history, past surgical history, social history and family history with the patient and they are unchanged from previous note.  ALLERGIES:  is allergic to morphine and related; penicillins; sulfonamide derivatives; tramadol; and tylenol.  MEDICATIONS:  Current Outpatient Prescriptions  Medication Sig Dispense Refill  . albuterol (PROVENTIL HFA;VENTOLIN HFA) 108 (90 BASE) MCG/ACT inhaler Inhale 2 puffs into the lungs every 4 (four) hours as needed for wheezing or shortness of breath. 1 Inhaler 3  . albuterol (PROVENTIL) (2.5 MG/3ML) 0.083% nebulizer solution Take 3 mLs (2.5 mg total) by nebulization every 6 (six) hours as needed for wheezing or shortness of breath. 75 mL 12  . aspirin EC 81 MG tablet Take 81 mg by mouth 3 (three) times a week. Monday, Tuesday and Wednesday    . Beclomethasone Dipropionate (QNASL) 80 MCG/ACT AERS Place 1 spray into the nose 2 (two) times daily. 1 Inhaler 0  . bisacodyl (DULCOLAX) 5 MG EC tablet Take 1 tablet (5 mg total) by mouth daily as needed for moderate constipation. 30 tablet 0  . chlorpheniramine-HYDROcodone (TUSSIONEX) 10-8 MG/5ML SUER Take 5 mLs by mouth every 12 (twelve) hours as needed for cough. 115 mL 0  . diclofenac sodium (VOLTAREN) 1 % GEL APPLY TO THE AFFECTED AREA TWICE DAILY AS NEEDED 100 g 3  . ferrous sulfate 325 (65 FE) MG tablet Take 1 tablet (325 mg total) by mouth daily with breakfast. 30 tablet 3  . gabapentin (NEURONTIN) 300 MG capsule Take 1 capsule (300 mg total) by mouth 4 (four) times daily as needed (nerve pain). 120 capsule 7  . hydrOXYzine (ATARAX/VISTARIL) 25 MG tablet Take 1 tablet (25 mg total) by mouth every 6 (six) hours  as needed for anxiety. 30 tablet 2  . ipratropium (ATROVENT) 0.02 % nebulizer solution Take 2.5 mLs (0.5 mg total) by nebulization 3 (three) times daily as needed for wheezing or shortness of  breath. Mix with xopenex 75 mL 1  . loratadine (CLARITIN) 10 MG tablet Take 10 mg by mouth daily.     . mometasone-formoterol (DULERA) 200-5 MCG/ACT AERO Take 2 puffs first thing in am and then another 2 puffs about 12 hours later. 1 Inhaler 11  . Multiple Vitamin (MULTIVITAMIN WITH MINERALS) TABS tablet Take 1 tablet by mouth at bedtime.    . mupirocin ointment (BACTROBAN) 2 % APPLY TO THE AFFECTED AREA TWICE DAILY FOR 2 WEEKS. PLACE COTTON BETWEEN TOES TO ALLOW BETTER AERATION (Patient taking differently: APPLY TO THE AFFECTED AREA TWICE DAILY FOR 2 WEEKS. PLACE COTTON BETWEEN TOES TO ALLOW BETTER AERATION PRN) 66 g 0  . NORVIR 100 MG TABS tablet TAKE 1 TABLET BY MOUTH EVERY DAY WITH PREZISTA AND TRUVADA 30 tablet 6  . omeprazole (PRILOSEC) 40 MG capsule Take 40 mg by mouth at bedtime.    . ondansetron (ZOFRAN) 4 MG tablet Take 1 tablet (4 mg total) by mouth every 6 (six) hours. 12 tablet 0  . Polyvinyl Alcohol-Povidone (REFRESH OP) Place 1 drop into both eyes at bedtime.    [name] PREZISTA 800 MG tablet TAKE 1 TABLET BY MOUTH EVERY DAY 30 tablet 6  . raltegravir (ISENTRESS) 400 MG tablet Take 800 mg by mouth at bedtime.    . tizanidine (ZANAFLEX) 6 MG capsule Take 1 capsule (6 mg total) by mouth 3 (three) times daily as needed for muscle spasms. 270 capsule 2  . traZODone (DESYREL) 50 MG tablet TAKE 1 TABLET BY MOUTH AT BEDTIME 90 tablet 3  . TRUVADA 200-300 MG per tablet TAKE 1 TABLET BY MOUTH EVERY DAY WITH PREZISTA AND NORVIR 30 tablet 6  . zolpidem (AMBIEN CR) 12.5 MG CR tablet Take 1 tablet (12.5 mg total) by mouth at bedtime as needed for sleep. 30 tablet 1   No current facility-administered medications for this visit.    PHYSICAL EXAMINATION: ECOG PERFORMANCE STATUS: 0 - Asymptomatic  Filed Vitals:   02/09/15 1244  BP: 133/87  Pulse: 95  Temp: 98.4 F (36.9 C)  Resp: 18   Filed Weights   02/09/15 1244  Weight: 204 lb 6.4 oz (92.715 kg)    GENERAL:alert, no distress and  comfortable SKIN: skin color, texture, turgor are normal, no rashes or significant lesions EYES: normal, Conjunctiva are pink and non-injected, sclera clear Musculoskeletal:no cyanosis of digits and no clubbing  NEURO: alert & oriented x 3 with fluent speech, no focal motor/sensory deficits  LABORATORY DATA:  I have reviewed the data as listed    Component Value Date/Time   NA 137 12/02/2014 1540   NA 142 02/24/2013 0848   K 4.1 12/02/2014 1540   K 3.4* 02/24/2013 0848   CL 103 12/02/2014 1540   CO2 27 12/02/2014 1540   CO2 24 02/24/2013 0848   GLUCOSE 95 12/02/2014 1540   GLUCOSE 121 02/24/2013 0848   GLUCOSE 93 03/25/2009   BUN 8 12/02/2014 1540   BUN 11.9 02/24/2013 0848   CREATININE 1.11 12/02/2014 1540   CREATININE 1.30* 11/20/2014 0515   CREATININE 1.3 02/24/2013 0848   CALCIUM 9.1 12/02/2014 1540   CALCIUM 8.9 02/24/2013 0848   PROT 7.5 12/02/2014 1540   PROT 7.1 02/24/2013 0848   ALBUMIN 4.4 12/02/2014 1540   ALBUMIN 3.6 02/24/2013  0848   AST 15 12/02/2014 1540   AST 14 02/24/2013 0848   ALT 12 12/02/2014 1540   ALT 11 02/24/2013 0848   ALKPHOS 154* 12/02/2014 1540   ALKPHOS 94 02/24/2013 0848   BILITOT 0.7 12/02/2014 1540   BILITOT 1.40* 02/24/2013 0848   GFRNONAA >60 11/20/2014 0515   GFRNONAA 81 10/13/2013 1155   GFRAA >60 11/20/2014 0515   GFRAA >89 10/13/2013 1155    No results found for: SPEP, UPEP  Lab Results  Component Value Date   WBC 7.1 01/21/2015   NEUTROABS 3.5 01/21/2015   HGB 12.0* 01/21/2015   HCT 38.5 01/21/2015   MCV 81.1 01/21/2015   PLT 177 01/21/2015      Chemistry      Component Value Date/Time   NA 137 12/02/2014 1540   NA 142 02/24/2013 0848   K 4.1 12/02/2014 1540   K 3.4* 02/24/2013 0848   CL 103 12/02/2014 1540   CO2 27 12/02/2014 1540   CO2 24 02/24/2013 0848   BUN 8 12/02/2014 1540   BUN 11.9 02/24/2013 0848   CREATININE 1.11 12/02/2014 1540   CREATININE 1.30* 11/20/2014 0515   CREATININE 1.3 02/24/2013 0848       Component Value Date/Time   CALCIUM 9.1 12/02/2014 1540   CALCIUM 8.9 02/24/2013 0848   ALKPHOS 154* 12/02/2014 1540   ALKPHOS 94 02/24/2013 0848   AST 15 12/02/2014 1540   AST 14 02/24/2013 0848   ALT 12 12/02/2014 1540   ALT 11 02/24/2013 0848   BILITOT 0.7 12/02/2014 1540   BILITOT 1.40* 02/24/2013 0848       RADIOGRAPHIC STUDIES: I reviewed the imaging study with the patient and his sister I have personally reviewed the radiological images as listed and agreed with the findings in the report. Dg Bone Survey Met  02/08/2015   CLINICAL DATA:  Lytic bone lesion in the left hip, history of HIV  EXAM: METASTATIC BONE SURVEY  COMPARISON:  Nuclear bone scan of August 16, 2012 and limited CT through the left iliac wing on October 11, 2012.  FINDINGS: Calvarium: No lytic or blastic bony lesion is observed.  Spine: There is multilevel degenerative disc disease of the mid cervical spine. There is no lytic or blastic lesion. The thoracic spine exhibits gentle levocurvature centered at T4. The pedicles are intact. No lytic or blastic lesion is observed. The lumbar spine reveals no compression fracture. There is mild degenerative disc disease at L3-4. There is no lytic nor blastic lesion.  Pelvis: The lytic lesion noted to be present in the left iliac wing is largely obscured by overlying stool and gas.  Chest: The lungs are well-expanded and clear. Prominent nipple shadows are noted bilaterally. The heart and mediastinal structures are normal. The ribs and clavicles exhibit no lytic or blastic lesions.  Upper extremities:  No lytic or blastic lesions are observed.  Lower extremities:  No lytic or blastic lesions are observed.  IMPRESSION: No lytic or blastic skeletal lesions are observed. The lytic lesion noted in the left iliac wing on the January thirty-first 2014 CT scan is not clearly evident today on this plain film series.   Electronically Signed   By: David  Martinique M.D.   On: 02/08/2015 10:30      ASSESSMENT & PLAN:  Hypochromic microcytic anemia The most likely cause of the microcytic anemia is related to GI bleeding until proven otherwise. His EGD from January 2016 showed gastritis. The patient has been taking a lot  of Aleve for joint pain. I recommend he avoid NSAID completely. He is doing very well with iron supplement and his hemoglobin has improved significantly. I recommend he continues the same. I plan to recheck CBC and iron studies in 3 months.   Lytic bone lesion of hip The patient had lytic lesion in the past and biopsy was negative for multiple myeloma Serum protein electrophoresis in the past did not detect any M spike. Skeletal survey was review with him and his sister which did not show any new lesions. I will order serum protein electrophoresis in 3 months. I suspect the nonspecific pattern seen on prior M spike was related to his HIV infection. 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   All questions were answered. The patient knows to call the clinic with any problems, questions or concerns. No barriers to learning was detected. I spent 15 minutes counseling the patient face to face. The total time spent in the appointment was 20 minutes and more than 50% was on counseling and review of test results     [facility], Ante Arredondo, MD 02/09/2015 1:26 PM

## 2015-02-09 NOTE — Assessment & Plan Note (Signed)
The patient had lytic lesion in the past and biopsy was negative for multiple myeloma Serum protein electrophoresis in the past did not detect any M spike. Skeletal survey was review with him and his sister which did not show any new lesions. I will order serum protein electrophoresis in 3 months. I suspect the nonspecific pattern seen on prior M spike was related to his HIV infection. 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

## 2015-02-16 ENCOUNTER — Other Ambulatory Visit: Payer: Self-pay | Admitting: Neurology

## 2015-02-19 ENCOUNTER — Telehealth: Payer: Self-pay | Admitting: *Deleted

## 2015-02-19 DIAGNOSIS — B351 Tinea unguium: Secondary | ICD-10-CM

## 2015-02-19 MED ORDER — MUPIROCIN 2 % EX OINT: TOPICAL_OINTMENT | CUTANEOUS | Status: DC

## 2015-02-19 NOTE — Telephone Encounter (Signed)
Pt requested refill

## 2015-03-09 ENCOUNTER — Emergency Department (HOSPITAL_COMMUNITY)
Admission: EM | Admit: 2015-03-09 | Discharge: 2015-03-09 | Disposition: A | Discharge status: Home / Self Care | Payer: Medicare Other | Attending: Emergency Medicine | Admitting: Emergency Medicine

## 2015-03-09 ENCOUNTER — Encounter (HOSPITAL_COMMUNITY): Payer: Self-pay | Admitting: Emergency Medicine

## 2015-03-09 ENCOUNTER — Emergency Department (HOSPITAL_COMMUNITY): Payer: Medicare Other

## 2015-03-09 DIAGNOSIS — J0100 Acute maxillary sinusitis, unspecified: Secondary | ICD-10-CM | POA: Insufficient documentation

## 2015-03-09 DIAGNOSIS — Z7982 Long term (current) use of aspirin: Secondary | ICD-10-CM | POA: Diagnosis not present

## 2015-03-09 DIAGNOSIS — Z8546 Personal history of malignant neoplasm of prostate: Secondary | ICD-10-CM | POA: Diagnosis not present

## 2015-03-09 DIAGNOSIS — Z880 Allergy status to penicillin: Secondary | ICD-10-CM | POA: Insufficient documentation

## 2015-03-09 DIAGNOSIS — M1990 Unspecified osteoarthritis, unspecified site: Secondary | ICD-10-CM | POA: Insufficient documentation

## 2015-03-09 DIAGNOSIS — Z21 Asymptomatic human immunodeficiency virus [HIV] infection status: Secondary | ICD-10-CM | POA: Diagnosis not present

## 2015-03-09 DIAGNOSIS — Z8701 Personal history of pneumonia (recurrent): Secondary | ICD-10-CM | POA: Diagnosis not present

## 2015-03-09 DIAGNOSIS — J45909 Unspecified asthma, uncomplicated: Secondary | ICD-10-CM | POA: Insufficient documentation

## 2015-03-09 DIAGNOSIS — Z87891 Personal history of nicotine dependence: Secondary | ICD-10-CM | POA: Diagnosis not present

## 2015-03-09 DIAGNOSIS — Z8614 Personal history of Methicillin resistant Staphylococcus aureus infection: Secondary | ICD-10-CM | POA: Insufficient documentation

## 2015-03-09 DIAGNOSIS — K219 Gastro-esophageal reflux disease without esophagitis: Secondary | ICD-10-CM | POA: Insufficient documentation

## 2015-03-09 DIAGNOSIS — J209 Acute bronchitis, unspecified: Secondary | ICD-10-CM | POA: Insufficient documentation

## 2015-03-09 DIAGNOSIS — R509 Fever, unspecified: Secondary | ICD-10-CM | POA: Diagnosis not present

## 2015-03-09 DIAGNOSIS — D649 Anemia, unspecified: Secondary | ICD-10-CM | POA: Insufficient documentation

## 2015-03-09 DIAGNOSIS — Z79899 Other long term (current) drug therapy: Secondary | ICD-10-CM | POA: Insufficient documentation

## 2015-03-09 DIAGNOSIS — J40 Bronchitis, not specified as acute or chronic: Secondary | ICD-10-CM

## 2015-03-09 DIAGNOSIS — Z87442 Personal history of urinary calculi: Secondary | ICD-10-CM | POA: Insufficient documentation

## 2015-03-09 DIAGNOSIS — R05 Cough: Secondary | ICD-10-CM | POA: Diagnosis not present

## 2015-03-09 DIAGNOSIS — R059 Cough, unspecified: Secondary | ICD-10-CM

## 2015-03-09 LAB — CBC WITH DIFFERENTIAL/PLATELET
Basophils Absolute: 0.1 10*3/uL (ref 0.0–0.1)
Basophils Relative: 1 % (ref 0–1)
Eosinophils Absolute: 0.6 10*3/uL (ref 0.0–0.7)
Eosinophils Relative: 8 % — ABNORMAL HIGH (ref 0–5)
HCT: 43.5 % (ref 39.0–52.0)
Hemoglobin: 14.6 g/dL (ref 13.0–17.0)
Lymphocytes Relative: 39 % (ref 12–46)
Lymphs Abs: 2.7 10*3/uL (ref 0.7–4.0)
MCH: 29.9 pg (ref 26.0–34.0)
MCHC: 33.6 g/dL (ref 30.0–36.0)
MCV: 89.1 fL (ref 78.0–100.0)
Monocytes Absolute: 0.8 10*3/uL (ref 0.1–1.0)
Monocytes Relative: 11 % (ref 3–12)
Neutro Abs: 2.8 10*3/uL (ref 1.7–7.7)
Neutrophils Relative %: 41 % — ABNORMAL LOW (ref 43–77)
Platelets: 178 10*3/uL (ref 150–400)
RBC: 4.88 MIL/uL (ref 4.22–5.81)
RDW: 17.5 % — ABNORMAL HIGH (ref 11.5–15.5)
WBC: 6.9 10*3/uL (ref 4.0–10.5)

## 2015-03-09 LAB — COMPREHENSIVE METABOLIC PANEL
ALT: 20 U/L (ref 17–63)
AST: 26 U/L (ref 15–41)
Albumin: 4 g/dL (ref 3.5–5.0)
Alkaline Phosphatase: 138 U/L — ABNORMAL HIGH (ref 38–126)
Anion gap: 8 (ref 5–15)
BUN: 6 mg/dL (ref 6–20)
CO2: 25 mmol/L (ref 22–32)
Calcium: 9.2 mg/dL (ref 8.9–10.3)
Chloride: 109 mmol/L (ref 101–111)
Creatinine, Ser: 1.43 mg/dL — ABNORMAL HIGH (ref 0.61–1.24)
GFR calc Af Amer: >60 mL/min (ref >60)
GFR calc non Af Amer: 55 mL/min — ABNORMAL LOW (ref >60)
Glucose, Bld: 109 mg/dL — ABNORMAL HIGH (ref 65–99)
Potassium: 3.8 mmol/L (ref 3.5–5.1)
Sodium: 142 mmol/L (ref 135–145)
Total Bilirubin: 0.6 mg/dL (ref 0.3–1.2)
Total Protein: 7.1 g/dL (ref 6.5–8.1)

## 2015-03-09 MED ORDER — HYDROCOD POLST-CPM POLST ER 10-8 MG/5ML PO SUER: 5.0000 mL | Freq: Two times a day (BID) | ORAL | Status: DC | PRN

## 2015-03-09 MED ORDER — KETOROLAC TROMETHAMINE 30 MG/ML IJ SOLN
30.0000 mg | Freq: Once | INTRAMUSCULAR | Status: AC
  Administered 2015-03-09: 30 mg via INTRAVENOUS
  Filled 2015-03-09: qty 1

## 2015-03-09 MED ORDER — ONDANSETRON HCL 4 MG/2ML IJ SOLN
4.0000 mg | Freq: Once | INTRAMUSCULAR | Status: AC
  Administered 2015-03-09: 4 mg via INTRAVENOUS
  Filled 2015-03-09: qty 2

## 2015-03-09 MED ORDER — OXYMETAZOLINE HCL 0.05 % NA SOLN: 2.0000 | Freq: Two times a day (BID) | NASAL | Status: DC

## 2015-03-09 MED ORDER — MOMETASONE FUROATE 50 MCG/ACT NA SUSP: 2.0000 | Freq: Every day | NASAL | Status: DC

## 2015-03-09 MED ORDER — SODIUM CHLORIDE 0.9 % IV BOLUS (SEPSIS)
1000.0000 mL | Freq: Once | INTRAVENOUS | Status: AC
  Administered 2015-03-09: 1000 mL via INTRAVENOUS

## 2015-03-09 MED ORDER — AZITHROMYCIN 250 MG PO TABS: 250.0000 mg | ORAL_TABLET | Freq: Every day | ORAL | Status: DC

## 2015-03-09 NOTE — ED Notes (Signed)
Dr. Yelverton at the bedside.  

## 2015-03-09 NOTE — Discharge Instructions (Signed)
Cough, Adult  A cough is a reflex that helps clear your throat and airways. It can help heal the body or may be a reaction to an irritated airway. A cough may only last 2 or 3 weeks (acute) or may last more than 8 weeks (chronic).  CAUSES Acute cough:  Viral or bacterial infections. Chronic cough:  Infections.  Allergies.  Asthma.  Post-nasal drip.  Smoking.  Heartburn or acid reflux.  Some medicines.  Chronic lung problems (COPD).  Cancer. SYMPTOMS   Cough.  Fever.  Chest pain.  Increased breathing rate.  High-pitched whistling sound when breathing (wheezing).  Colored mucus that you cough up (sputum). TREATMENT   A bacterial cough may be treated with antibiotic medicine.  A viral cough must run its course and will not respond to antibiotics.  Your caregiver may recommend other treatments if you have a chronic cough. HOME CARE INSTRUCTIONS   Only take over-the-counter or prescription medicines for pain, discomfort, or fever as directed by your caregiver. Use cough suppressants only as directed by your caregiver.  Use a cold steam vaporizer or humidifier in your bedroom or home to help loosen secretions.  Sleep in a semi-upright position if your cough is worse at night.  Rest as needed.  Stop smoking if you smoke. SEEK IMMEDIATE MEDICAL CARE IF:   You have pus in your sputum.  Your cough starts to worsen.  You cannot control your cough with suppressants and are losing sleep.  You begin coughing up blood.  You have difficulty breathing.  You develop pain which is getting worse or is uncontrolled with medicine.  You have a fever. MAKE SURE YOU:   Understand these instructions.  Will watch your condition.  Will get help right away if you are not doing well or get worse. Document Released: 12/16/2010 Document Revised: 09/11/2011 Document Reviewed: 12/16/2010 [employer] Patient Information 2015 [employer]. This information is not intended  to replace advice given to you by your health care provider. Make sure you discuss any questions you have with your health care provider.  Sinusitis Sinusitis is redness, soreness, and inflammation of the paranasal sinuses. Paranasal sinuses are air pockets within the bones of your face (beneath the eyes, the middle of the forehead, or above the eyes). In healthy paranasal sinuses, mucus is able to drain out, and air is able to circulate through them by way of your nose. However, when your paranasal sinuses are inflamed, mucus and air can become trapped. This can allow bacteria and other germs to grow and cause infection. Sinusitis can develop quickly and last only a short time (acute) or continue over a long period (chronic). Sinusitis that lasts for more than 12 weeks is considered chronic.  CAUSES  Causes of sinusitis include:  Allergies.  Structural abnormalities, such as displacement of the cartilage that separates your nostrils (deviated septum), which can decrease the air flow through your nose and sinuses and affect sinus drainage.  Functional abnormalities, such as when the small hairs (cilia) that line your sinuses and help remove mucus do not work properly or are not present. SIGNS AND SYMPTOMS  Symptoms of acute and chronic sinusitis are the same. The primary symptoms are pain and pressure around the affected sinuses. Other symptoms include:  Upper toothache.  Earache.  Headache.  Bad breath.  Decreased sense of smell and taste.  A cough, which worsens when you are lying flat.  Fatigue.  Fever.  Thick drainage from your nose, which often is green   and may contain pus (purulent).  Swelling and warmth over the affected sinuses. DIAGNOSIS  Your health care provider will perform a physical exam. During the exam, your health care provider may:  Look in your nose for signs of abnormal growths in your nostrils (nasal polyps).  Tap over the affected sinus to check for signs  of infection.  View the inside of your sinuses (endoscopy) using an imaging device that has a light attached (endoscope). If your health care provider suspects that you have chronic sinusitis, one or more of the following tests may be recommended:  Allergy tests.  Nasal culture. A sample of mucus is taken from your nose, sent to a lab, and screened for bacteria.  Nasal cytology. A sample of mucus is taken from your nose and examined by your health care provider to determine if your sinusitis is related to an allergy. TREATMENT  Most cases of acute sinusitis are related to a viral infection and will resolve on their own within 10 days. Sometimes medicines are prescribed to help relieve symptoms (pain medicine, decongestants, nasal steroid sprays, or saline sprays).  However, for sinusitis related to a bacterial infection, your health care provider will prescribe antibiotic medicines. These are medicines that will help kill the bacteria causing the infection.  Rarely, sinusitis is caused by a fungal infection. In theses cases, your health care provider will prescribe antifungal medicine. For some cases of chronic sinusitis, surgery is needed. Generally, these are cases in which sinusitis recurs more than 3 times per year, despite other treatments. HOME CARE INSTRUCTIONS   Drink plenty of water. Water helps thin the mucus so your sinuses can drain more easily.  Use a humidifier.  Inhale steam 3 to 4 times a day (for example, sit in the bathroom with the shower running).  Apply a warm, moist washcloth to your face 3 to 4 times a day, or as directed by your health care provider.  Use saline nasal sprays to help moisten and clean your sinuses.  Take medicines only as directed by your health care provider.  If you were prescribed either an antibiotic or antifungal medicine, finish it all even if you start to feel better. SEEK IMMEDIATE MEDICAL CARE IF:  You have increasing pain or severe  headaches.  You have nausea, vomiting, or drowsiness.  You have swelling around your face.  You have vision problems.  You have a stiff neck.  You have difficulty breathing. MAKE SURE YOU:   Understand these instructions.  Will watch your condition.  Will get help right away if you are not doing well or get worse. Document Released: 06/19/2005 Document Revised: 11/03/2013 Document Reviewed: 07/04/2011 [employer] Patient Information 2015 [employer]. This information is not intended to replace advice given to you by your health care provider. Make sure you discuss any questions you have with your health care provider.  Upper Respiratory Infection, Adult An upper respiratory infection (URI) is also sometimes known as the common cold. The upper respiratory tract includes the nose, sinuses, throat, trachea, and bronchi. Bronchi are the airways leading to the lungs. Most people improve within 1 week, but symptoms can last up to 2 weeks. A residual cough may last even longer.  CAUSES Many different viruses can infect the tissues lining the upper respiratory tract. The tissues become irritated and inflamed and often become very moist. Mucus production is also common. A cold is contagious. You can easily spread the virus to others by oral contact. This includes kissing, sharing   a glass, coughing, or sneezing. Touching your mouth or nose and then touching a surface, which is then touched by another person, can also spread the virus. SYMPTOMS  Symptoms typically develop 1 to 3 days after you come in contact with a cold virus. Symptoms vary from person to person. They may include:  Runny nose.  Sneezing.  Nasal congestion.  Sinus irritation.  Sore throat.  Loss of voice (laryngitis).  Cough.  Fatigue.  Muscle aches.  Loss of appetite.  Headache.  Low-grade fever. DIAGNOSIS  You might diagnose your own cold based on familiar symptoms, since most people get a cold 2 to 3  times a year. Your caregiver can confirm this based on your exam. Most importantly, your caregiver can check that your symptoms are not due to another disease such as strep throat, sinusitis, pneumonia, asthma, or epiglottitis. Blood tests, throat tests, and X-rays are not necessary to diagnose a common cold, but they may sometimes be helpful in excluding other more serious diseases. Your caregiver will decide if any further tests are required. RISKS AND COMPLICATIONS  You may be at risk for a more severe case of the common cold if you smoke cigarettes, have chronic heart disease (such as heart failure) or lung disease (such as asthma), or if you have a weakened immune system. The very young and very old are also at risk for more serious infections. Bacterial sinusitis, middle ear infections, and bacterial pneumonia can complicate the common cold. The common cold can worsen asthma and chronic obstructive pulmonary disease (COPD). Sometimes, these complications can require emergency medical care and may be life-threatening. PREVENTION  The best way to protect against getting a cold is to practice good hygiene. Avoid oral or hand contact with people with cold symptoms. Wash your hands often if contact occurs. There is no clear evidence that vitamin C, vitamin E, echinacea, or exercise reduces the chance of developing a cold. However, it is always recommended to get plenty of rest and practice good nutrition. TREATMENT  Treatment is directed at relieving symptoms. There is no cure. Antibiotics are not effective, because the infection is caused by a virus, not by bacteria. Treatment may include:  Increased fluid intake. Sports drinks offer valuable electrolytes, sugars, and fluids.  Breathing heated mist or steam (vaporizer or shower).  Eating chicken soup or other clear broths, and maintaining good nutrition.  Getting plenty of rest.  Using gargles or lozenges for comfort.  Controlling fevers with  ibuprofen or acetaminophen as directed by your caregiver.  Increasing usage of your inhaler if you have asthma. Zinc gel and zinc lozenges, taken in the first 24 hours of the common cold, can shorten the duration and lessen the severity of symptoms. Pain medicines may help with fever, muscle aches, and throat pain. A variety of non-prescription medicines are available to treat congestion and runny nose. Your caregiver can make recommendations and may suggest nasal or lung inhalers for other symptoms.  HOME CARE INSTRUCTIONS   Only take over-the-counter or prescription medicines for pain, discomfort, or fever as directed by your caregiver.  Use a warm mist humidifier or inhale steam from a shower to increase air moisture. This may keep secretions moist and make it easier to breathe.  Drink enough water and fluids to keep your urine clear or pale yellow.  Rest as needed.  Return to work when your temperature has returned to normal or as your caregiver advises. You may need to stay home longer to avoid infecting   others. You can also use a face mask and careful hand washing to prevent spread of the virus. SEEK MEDICAL CARE IF:   After the first few days, you feel you are getting worse rather than better.  You need your caregiver's advice about medicines to control symptoms.  You develop chills, worsening shortness of breath, or brown or red sputum. These may be signs of pneumonia.  You develop yellow or brown nasal discharge or pain in the face, especially when you bend forward. These may be signs of sinusitis.  You develop a fever, swollen neck glands, pain with swallowing, or white areas in the back of your throat. These may be signs of strep throat. SEEK IMMEDIATE MEDICAL CARE IF:   You have a fever.  You develop severe or persistent headache, ear pain, sinus pain, or chest pain.  You develop wheezing, a prolonged cough, cough up blood, or have a change in your usual mucus (if you have  chronic lung disease).  You develop sore muscles or a stiff neck. Document Released: 12/13/2000 Document Revised: 09/11/2011 Document Reviewed: 09/24/2013 [employer] Patient Information 2015 [employer]. This information is not intended to replace advice given to you by your health care provider. Make sure you discuss any questions you have with your health care provider.  

## 2015-03-09 NOTE — ED Provider Notes (Signed)
CSN: [id number]     Arrival date & time 03/09/15  0203 History  This chart was scribed for Julianne Rice, MD by Irene Pap, ED Scribe. This patient was seen in room B15C/B15C and patient care was started at 3:16 AM.   Chief Complaint  Patient presents with  . Cough   The history is provided by the patient. No language interpreter was used.   HPI Comments: Casey Brennan is a 54 y.o. Male with hx of HIV, asthma, GERD, and pneumonia who presents to the Emergency Department complaining of productive cough with yellow phlegm onset one week ago. Pt reports associated chest congestion, sinus headache, nasal congestion, rhinorrhea, fever, chills, myalgias, fatigue, and chest pain that worsens with coughing. Pt states that this feels like his past hx of pneumonia. Pt has been taking Tussinex, Mucinex, tea with honey and ginger to some relief. Pt denies leg swelling or pain, rash, or sick contacts.   Past Medical History  Diagnosis Date  . HIV (human immunodeficiency virus infection) 1988  . Chronic anemia     normocytic  . Chronic folliculitis   . Gross hematuria   . Elevated PSA   . H/O pericarditis     2010--  myopercarditis--  resolved  . History of concussion     2012  &  2013  RESIDUAL HA'S --  RESOLVED  . Axillary lymphadenopathy   . History of MRSA infection 2010    infected boil  . History of gastric ulcer   . Lytic bone lesion of hip     WORK-UP DONE BY ONCOLOGIST DR HA --  NOT MALIGNANT  . Wears glasses   . Headache(784.0)     HX SEVERE FRONTAL HA'S  . Ulcer     hx of gastric  . Asthma     very rare  . Arthritis     r knee   . GERD (gastroesophageal reflux disease)   . Bronchitis   . Post concussion syndrome     resolved  . Prostate cancer 04/25/13    gleason 3+3=6, volume 30 gm  . Environmental and seasonal allergies   . Pneumonia     hx of  . HCAP (healthcare-associated pneumonia) 11/19/2014  . Kidney stones    Past Surgical History  Procedure Laterality  Date  . Upper leg soft tissue biopsy Left 2012    lthigh  . Knee arthroscopy Right 1985  . Colonoscopy  12/26/2011    Procedure: COLONOSCOPY;  Surgeon: Lear Ng, MD;  Location: [facility];  Service: Endoscopy;  Laterality: N/A;  . Esophagogastroduodenoscopy  12/26/2011    Procedure: ESOPHAGOGASTRODUODENOSCOPY (EGD);  Surgeon: Lear Ng, MD;  Location: [facility];  Service: Endoscopy;  Laterality: N/A;  . Excisional bx left breast mass/  i  &  d left breast abscess  03-24-2009  . Excision chronic left breast abscess  09-21-2010  . Cystoscopy/retrograde/ureteroscopy Bilateral 04/25/2013    Procedure: CYSTOSCOPY/ BILATERAL RETROGRADES; BLADDER BIOPSIES;  Surgeon: Alexis Frock, MD;  Location: [facility];  Service: Urology;  Laterality: Bilateral;  . Prostate biopsy N/A 04/25/2013    Procedure: BIOPSY TRANSRECTAL ULTRASONIC PROSTATE (TUBP);  Surgeon: Alexis Frock, MD;  Location: [facility];  Service: Urology;  Laterality: N/A;  . Left hip biopsy  10/2012  . Left axilla biopsy  04/2013  . Robot assisted laparoscopic radical prostatectomy N/A 08/18/2013    Procedure: ROBOTIC ASSISTED LAPAROSCOPIC RADICAL PROSTATECTOMY;  Surgeon: Alexis Frock, MD;  Location: [facility];  Service:  Urology;  Laterality: N/A;  . Lymphadenectomy Bilateral 08/18/2013    Procedure: LYMPHADENECTOMY "PELVIC LYMPH NODE DISSECTION";  Surgeon: Alexis Frock, MD;  Location: [facility];  Service: Urology;  Laterality: Bilateral;  . Esophagogastroduodenoscopy (egd) with propofol N/A 07/29/2014    Procedure: ESOPHAGOGASTRODUODENOSCOPY (EGD) WITH PROPOFOL;  Surgeon: Lear Ng, MD;  Location: [facility];  Service: Endoscopy;  Laterality: N/A;  . Colonoscopy with propofol N/A 07/29/2014    Procedure: COLONOSCOPY WITH PROPOFOL;  Surgeon: Lear Ng, MD;  Location: [facility];  Service: Endoscopy;  Laterality: N/A;  . Cardiac catheterization  03-23-2009  DR COOPER     NORMAL CORONARY ARTERIES  . Dental examination under anesthesia    . Cholecystectomy N/A 11/06/2014    Procedure: LAPAROSCOPIC CHOLECYSTECTOMY WITH INTRAOPERATIVE CHOLANGIOGRAM;  Surgeon: Judeth Horn, MD;  Location[facility];  Service: General;  Laterality: N/A;   Family History  Problem Relation Age of Onset  . Stroke Father   . Diabetes Father   . Cancer Father     brain cancer  . Hypertension Sister   . Cancer Maternal Uncle     prostate cancer  . Asthma Sister   . Asthma Father    Social History  Substance Use Topics  . Smoking status: Former Smoker -- 1.50 packs/day for 23 years    Types: Cigars, Cigarettes    Quit date: 05/22/2010  . Smokeless tobacco: Never Used  . Alcohol Use: No    Review of Systems  Constitutional: Positive for fever, chills and fatigue.  HENT: Positive for congestion and sinus pressure. Negative for sore throat.   Respiratory: Positive for cough and shortness of breath.   Cardiovascular: Positive for chest pain. Negative for palpitations and leg swelling.  Gastrointestinal: Positive for nausea. Negative for vomiting, abdominal pain and diarrhea.  Genitourinary: Negative for dysuria, frequency and flank pain.  Musculoskeletal: Positive for myalgias. Negative for back pain, neck pain and neck stiffness.  Skin: Negative for rash and wound.  Neurological: Positive for headaches. Negative for dizziness, weakness, light-headedness and numbness.  All other systems reviewed and are negative.     Allergies  Morphine and related; Penicillins; Sulfonamide derivatives; Tramadol; and Tylenol  Home Medications   Prior to Admission medications   Medication Sig Start Date End Date Taking? Authorizing Provider  albuterol (PROVENTIL HFA;VENTOLIN HFA) 108 (90 BASE) MCG/ACT inhaler Inhale 2 puffs into the lungs every 4 (four) hours as needed for wheezing or shortness of breath. 11/11/14   Noemi Chapel, MD  albuterol (PROVENTIL) (2.5 MG/3ML) 0.083% nebulizer  solution Take 3 mLs (2.5 mg total) by nebulization every 6 (six) hours as needed for wheezing or shortness of breath. 11/11/14   Noemi Chapel, MD  aspirin EC 81 MG tablet Take 81 mg by mouth 3 (three) times a week. Monday, Tuesday and Wednesday    Historical Provider, MD  azithromycin (ZITHROMAX) 250 MG tablet Take 1 tablet (250 mg total) by mouth daily. Take first 2 tablets together, then 1 every day until finished. 03/09/15   Julianne Rice, MD  Beclomethasone Dipropionate (QNASL) 80 MCG/ACT AERS Place 1 spray into the nose 2 (two) times daily. 07/22/14   Tanda Rockers, MD  bisacodyl (DULCOLAX) 5 MG EC tablet Take 1 tablet (5 mg total) by mouth daily as needed for moderate constipation. 11/21/14   Charlesetta Shanks, MD  chlorpheniramine-HYDROcodone (TUSSIONEX) 10-8 MG/5ML SUER Take 5 mLs by mouth every 12 (twelve) hours as needed for cough. 03/09/15   Julianne Rice, MD  diclofenac sodium (  VOLTAREN) 1 % GEL APPLY TO THE AFFECTED AREA TWICE DAILY AS NEEDED 02/05/15   Campbell Riches, MD  ferrous sulfate 325 (65 FE) MG tablet Take 1 tablet (325 mg total) by mouth daily with breakfast. 11/21/14   Charlesetta Shanks, MD  gabapentin (NEURONTIN) 300 MG capsule Take 1 capsule (300 mg total) by mouth 4 (four) times daily as needed (nerve pain). 09/02/14   Dennie Bible, NP  hydrOXYzine (ATARAX/VISTARIL) 25 MG tablet Take 1 tablet (25 mg total) by mouth every 6 (six) hours as needed for anxiety. 12/16/14   Campbell Riches, MD  ipratropium (ATROVENT) 0.02 % nebulizer solution Take 2.5 mLs (0.5 mg total) by nebulization 3 (three) times daily as needed for wheezing or shortness of breath. Mix with xopenex 02/13/14   Carly J Rivet, MD  loratadine (CLARITIN) 10 MG tablet Take 10 mg by mouth daily.     Historical Provider, MD  mometasone (NASONEX) 50 MCG/ACT nasal spray Place 2 sprays into the nose daily. 03/09/15   Julianne Rice, MD  mometasone-formoterol [facility]) 200-5 MCG/ACT AERO Take 2 puffs first thing in am and  then another 2 puffs about 12 hours later. 07/22/14   Tanda Rockers, MD  Multiple Vitamin (MULTIVITAMIN WITH MINERALS) TABS tablet Take 1 tablet by mouth at bedtime.    Historical Provider, MD  mupirocin ointment (BACTROBAN) 2 % APPLY TO THE AFFECTED AREA TWICE DAILY FOR 2 WEEKS. PLACE COTTON BETWEEN TOES TO ALLOW BETTER AERATION PRN 02/19/15   Campbell Riches, MD  NORVIR 100 MG TABS tablet TAKE 1 TABLET BY MOUTH EVERY DAY WITH PREZISTA AND TRUVADA 07/27/14   Campbell Riches, MD  omeprazole (PRILOSEC) 40 MG capsule Take 40 mg by mouth at bedtime.    Historical Provider, MD  ondansetron (ZOFRAN) 4 MG tablet Take 1 tablet (4 mg total) by mouth every 6 (six) hours. 11/11/14   Quintella Reichert, MD  oxymetazoline [facility] NASAL SPRAY) 0.05 % nasal spray Place 2 sprays into both nostrils 2 (two) times daily. 03/09/15   Julianne Rice, MD  Polyvinyl Alcohol-Povidone (REFRESH OP) Place 1 drop into both eyes at bedtime.    Historical Provider, MD  PREZISTA 800 MG tablet TAKE 1 TABLET BY MOUTH EVERY DAY 07/27/14   Campbell Riches, MD  raltegravir (ISENTRESS) 400 MG tablet Take 800 mg by mouth at bedtime.    Historical Provider, MD  tizanidine (ZANAFLEX) 6 MG capsule Take 1 capsule (6 mg total) by mouth 3 (three) times daily as needed for muscle spasms. 01/17/15   Asencion Partridge Dohmeier, MD  traZODone (DESYREL) 50 MG tablet TAKE 1 TABLET BY MOUTH AT BEDTIME 01/19/15   Carly J Rivet, MD  TRUVADA 200-300 MG per tablet TAKE 1 TABLET BY MOUTH EVERY DAY WITH PREZISTA AND NORVIR 07/27/14   Campbell Riches, MD  ZANAFLEX 6 MG capsule TAKE ONE CAPSULE BY MOUTH THREE TIMES DAILY AS NEEDED FOR MUSCLE SPASMS 02/16/15   Asencion Partridge Dohmeier, MD   BP 145/83 mmHg  Pulse 89  Temp(Src) 99 F (37.2 C) (Oral)  Resp 18  SpO2 94% Physical Exam  Constitutional: He is oriented to person, place, and time. He appears well-developed and well-nourished. No distress.  HENT:  Head: Normocephalic and atraumatic.  Mouth/Throat: Oropharynx is clear  and moist. No oropharyngeal exudate.  Bilateral nasal mucosal edema. Tender to percussion over the bilateral maxillary sinuses.  Eyes: EOM are normal. Pupils are equal, round, and reactive to light.  Neck: Normal range of motion. Neck  supple.  No Meningismus  Cardiovascular: Normal rate and regular rhythm.  Exam reveals no gallop and no friction rub.   No murmur heard. Pulmonary/Chest: Effort normal and breath sounds normal. No respiratory distress. He has no wheezes. He has no rales. He exhibits tenderness (left-sided chest pain reproduced with palpation over the left anterior chest.).  Abdominal: Soft. Bowel sounds are normal. He exhibits no distension and no mass. There is no tenderness. There is no rebound and no guarding.  Musculoskeletal: Normal range of motion. He exhibits no edema or tenderness.  NO calf swelling or tenderness.  Neurological: He is alert and oriented to person, place, and time.  Moves all ext without deficit. Sensation grossly intact.  Skin: Skin is warm and dry. No rash noted. No erythema.  Psychiatric: He has a normal mood and affect. His behavior is normal.  Nursing note and vitals reviewed.   ED Course  Procedures (including critical care time) DIAGNOSTIC STUDIES: Oxygen Saturation is 93% on RA, low by my interpretation.    COORDINATION OF CARE: 3:18 AM-Discussed treatment plan which includes x-ray, labs and nasal sprays with pt at bedside and pt agreed to plan.   Labs Review Labs Reviewed  CBC WITH DIFFERENTIAL/PLATELET - Abnormal; Notable for the following:    RDW 17.5 (*)    Neutrophils Relative % 41 (*)    Eosinophils Relative 8 (*)    All other components within normal limits  COMPREHENSIVE METABOLIC PANEL - Abnormal; Notable for the following:    Glucose, Bld 109 (*)    Creatinine, Ser 1.43 (*)    Alkaline Phosphatase 138 (*)    GFR calc non Af Amer 55 (*)    All other components within normal limits   Imaging Review Dg Chest 2  View  03/09/2015   CLINICAL DATA:  Congestion, cough, and fever for 1 week.  EXAM: CHEST  2 VIEW  COMPARISON:  11/26/2014  FINDINGS: The heart size and mediastinal contours are within normal limits. Both lungs are clear. The visualized skeletal structures are unremarkable.  IMPRESSION: No active cardiopulmonary disease.   Electronically Signed   By: Lucienne Capers M.D.   On: 03/09/2015 03:08      EKG Interpretation   Date/Time:  Tuesday March 09 2015 06:11:28 EDT Ventricular Rate:  84 PR Interval:  175 QRS Duration: 83 QT Interval:  380 QTC Calculation: 449 R Axis:   59 Text Interpretation:  Sinus rhythm Consider left atrial enlargement  Confirmed by Lita Mains  MD, Rondell Pardon ([id number]) on 03/10/2015 3:45:45 AM      MDM   Final diagnoses:  Acute maxillary sinusitis, recurrence not specified  Bronchitis   I personally performed the services described in this documentation, which was scribed in my presence. The recorded information has been reviewed and is accurate.   Chest x-ray without any acute abnormalities. Vital signs remained stable. Likely sinusitis and bronchitis. Discussed the need to follow-up with his primary physician. Return precautions have been given.  Chest pain is reproducible with palpation. Suspect muscle strain. Low suspicion for coronary artery disease. EKG without any ischemic findings.  Julianne Rice, MD 03/10/15 [phone]

## 2015-03-09 NOTE — ED Notes (Signed)
Pt. reports productive cough , chest congestion , runny nose/nasal congestion , fever , chills , body aches and fatigue onset last week .

## 2015-03-09 NOTE — ED Notes (Signed)
Patient is in xray 

## 2015-03-09 NOTE — ED Notes (Signed)
Reported request for pain and nausea to Dr. Lita Mains. MD acknowledges.

## 2015-03-09 NOTE — ED Notes (Signed)
Patient transported to X-ray 

## 2015-03-10 ENCOUNTER — Other Ambulatory Visit: Payer: Self-pay | Admitting: Infectious Diseases

## 2015-03-10 ENCOUNTER — Other Ambulatory Visit: Payer: Self-pay | Admitting: Internal Medicine

## 2015-03-28 ENCOUNTER — Encounter (HOSPITAL_COMMUNITY): Payer: Self-pay | Admitting: Emergency Medicine

## 2015-03-28 ENCOUNTER — Emergency Department (HOSPITAL_COMMUNITY)
Admission: EM | Admit: 2015-03-28 | Discharge: 2015-03-28 | Disposition: A | Discharge status: Home / Self Care | Payer: Medicare Other | Attending: Emergency Medicine | Admitting: Emergency Medicine

## 2015-03-28 DIAGNOSIS — Z79899 Other long term (current) drug therapy: Secondary | ICD-10-CM | POA: Insufficient documentation

## 2015-03-28 DIAGNOSIS — Z880 Allergy status to penicillin: Secondary | ICD-10-CM | POA: Diagnosis not present

## 2015-03-28 DIAGNOSIS — M1990 Unspecified osteoarthritis, unspecified site: Secondary | ICD-10-CM | POA: Insufficient documentation

## 2015-03-28 DIAGNOSIS — Z8701 Personal history of pneumonia (recurrent): Secondary | ICD-10-CM | POA: Diagnosis not present

## 2015-03-28 DIAGNOSIS — Z87891 Personal history of nicotine dependence: Secondary | ICD-10-CM | POA: Diagnosis not present

## 2015-03-28 DIAGNOSIS — Z7982 Long term (current) use of aspirin: Secondary | ICD-10-CM | POA: Insufficient documentation

## 2015-03-28 DIAGNOSIS — M79671 Pain in right foot: Secondary | ICD-10-CM | POA: Diagnosis present

## 2015-03-28 DIAGNOSIS — Z8546 Personal history of malignant neoplasm of prostate: Secondary | ICD-10-CM | POA: Diagnosis not present

## 2015-03-28 DIAGNOSIS — K219 Gastro-esophageal reflux disease without esophagitis: Secondary | ICD-10-CM | POA: Diagnosis not present

## 2015-03-28 DIAGNOSIS — Z21 Asymptomatic human immunodeficiency virus [HIV] infection status: Secondary | ICD-10-CM | POA: Diagnosis not present

## 2015-03-28 DIAGNOSIS — Z7951 Long term (current) use of inhaled steroids: Secondary | ICD-10-CM | POA: Insufficient documentation

## 2015-03-28 DIAGNOSIS — Z8614 Personal history of Methicillin resistant Staphylococcus aureus infection: Secondary | ICD-10-CM | POA: Insufficient documentation

## 2015-03-28 DIAGNOSIS — Z87442 Personal history of urinary calculi: Secondary | ICD-10-CM | POA: Diagnosis not present

## 2015-03-28 DIAGNOSIS — J45909 Unspecified asthma, uncomplicated: Secondary | ICD-10-CM | POA: Diagnosis not present

## 2015-03-28 DIAGNOSIS — L97511 Non-pressure chronic ulcer of other part of right foot limited to breakdown of skin: Secondary | ICD-10-CM

## 2015-03-28 DIAGNOSIS — L97509 Non-pressure chronic ulcer of other part of unspecified foot with unspecified severity: Secondary | ICD-10-CM | POA: Diagnosis not present

## 2015-03-28 MED ORDER — CLINDAMYCIN HCL 300 MG PO CAPS: 600.0000 mg | ORAL_CAPSULE | Freq: Three times a day (TID) | ORAL | Status: AC

## 2015-03-28 MED ORDER — OXYCODONE HCL 5 MG PO TABS: 5.0000 mg | ORAL_TABLET | Freq: Two times a day (BID) | ORAL | Status: DC | PRN

## 2015-03-28 MED ORDER — MUPIROCIN CALCIUM 2 % EX CREA
TOPICAL_CREAM | Freq: Once | CUTANEOUS | Status: AC
  Administered 2015-03-28: 06:00:00 via TOPICAL
  Filled 2015-03-28: qty 15

## 2015-03-28 MED ORDER — CLINDAMYCIN HCL 300 MG PO CAPS
600.0000 mg | ORAL_CAPSULE | Freq: Once | ORAL | Status: AC
  Administered 2015-03-28: 600 mg via ORAL
  Filled 2015-03-28: qty 2

## 2015-03-28 NOTE — Discharge Instructions (Signed)
Skin Ulcer Mr. Ironside, you were seen today for an ulcer.  Use the cream twice per day on the area and take antibiotics as directed.  See a primary care doctor within 3 days for close follow up.  If symptoms worsen, come back to the ED immediately.  Thank you. A skin ulcer is an open sore that can be shallow or deep. Skin ulcers sometimes become infected and are difficult to treat. It may be 1 month or longer before real healing progress is made. CAUSES   Injury.  Problems with the veins or arteries.  Diabetes.  Insect bites.  Bedsores.  Inflammatory conditions. SYMPTOMS   Pain, redness, swelling, and tenderness around the ulcer.  Fever.  Bleeding from the ulcer.  Yellow or clear fluid coming from the ulcer. DIAGNOSIS  There are many types of skin ulcers. Any open sores will be examined. Certain tests will be done to determine the kind of ulcer you have. The right treatment depends on the type of ulcer you have. TREATMENT  Treatment is a long-term challenge. It may include:  Wearing an elastic wrap, compression stockings, or gel cast over the ulcer area.  Taking antibiotic medicines or putting antibiotic creams on the affected area if there is an infection. HOME CARE INSTRUCTIONS  Put on your bandages (dressings), wraps, or casts over the ulcer as directed by your caregiver.  Change all dressings as directed by your caregiver.  Take all medicines as directed by your caregiver.  Keep the affected area clean and dry.  Avoid injuries to the affected area.  Eat a well-balanced, healthy diet that includes plenty of fruit and vegetables.  If you smoke, consider quitting or decreasing the amount of cigarettes you smoke.  Once the ulcer heals, get regular exercise as directed by your caregiver.  Work with your caregiver to make sure your blood pressure, cholesterol, and diabetes are well-controlled.  Keep your skin moisturized. Dry skin can crack and lead to skin  ulcers. SEEK IMMEDIATE MEDICAL CARE IF:   Your pain gets worse.  You have swelling, redness, or fluids around the ulcer.  You have chills.  You have a fever. MAKE SURE YOU:   Understand these instructions.  Will watch your condition.  Will get help right away if you are not doing well or get worse. Document Released: 07/27/2004 Document Revised: 09/11/2011 Document Reviewed: 02/03/2011 [facility] Patient Information 2015 [place], [place]. This information is not intended to replace advice given to you by your health care provider. Make sure you discuss any questions you have with your health care provider.

## 2015-03-28 NOTE — ED Notes (Signed)
MD at bedside. 

## 2015-03-28 NOTE — ED Notes (Signed)
Pt arrives with c/o "pus pocket" on foot, pt a local truck driver. Hx of cysts in same area, no hx of diabetes. Doesn't remember injury. Open area in between pinky toe and fourth toe. Hard palpable area on dorsal side of pinky toe.

## 2015-03-28 NOTE — ED Provider Notes (Signed)
CSN: [id number]     Arrival date & time 03/28/15  0124 History   First MD Initiated Contact with Patient 03/28/15 0403     Chief Complaint  Patient presents with  . Foot Problem     (Consider location/radiation/quality/duration/timing/severity/associated sxs/prior Treatment) HPI  DAESHON GRAMMATICO is a 54 y.o. male with past medical history of HIV, last CD4 count over 500, presenting today with wound between his fourth and fifth digits on the right foot. Patient states it is been there for the past couple days. This occurred on the left side as well and he was treated with an biotics. 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 He denies any fevers or systemic symptoms. He admits to having drainage coming from the area as well. Its becoming increasingly painful. He has no further complaints.  10 Systems reviewed and are negative for acute change except as noted in the HPI.    Past Medical History  Diagnosis Date  . HIV (human immunodeficiency virus infection) 1988  . Chronic anemia     normocytic  . Chronic folliculitis   . Gross hematuria   . Elevated PSA   . H/O pericarditis     2010--  myopercarditis--  resolved  . History of concussion     2012  &  2013  RESIDUAL HA'S --  RESOLVED  . Axillary lymphadenopathy   . History of MRSA infection 2010    infected boil  . History of gastric ulcer   . Lytic bone lesion of hip     WORK-UP DONE BY ONCOLOGIST DR HA --  NOT MALIGNANT  . Wears glasses   . Headache(784.0)     HX SEVERE FRONTAL HA'S  . Ulcer     hx of gastric  . Asthma     very rare  . Arthritis     r knee   . GERD (gastroesophageal reflux disease)   . Bronchitis   . Post concussion syndrome     resolved  . Prostate cancer 04/25/13    gleason 3+3=6, volume 30 gm  . Environmental and seasonal allergies   . Pneumonia     hx of  . HCAP (healthcare-associated pneumonia) 11/19/2014  . Kidney stones    Past Surgical History  Procedure Laterality  Date  . Upper leg soft tissue biopsy Left 2012    lthigh  . Knee arthroscopy Right 1985  . Colonoscopy  12/26/2011    Procedure: COLONOSCOPY;  Surgeon: Lear Ng, MD;  Location: [facility];  Service: Endoscopy;  Laterality: N/A;  . Esophagogastroduodenoscopy  12/26/2011    Procedure: ESOPHAGOGASTRODUODENOSCOPY (EGD);  Surgeon: Lear Ng, MD;  Location: [facility];  Service: Endoscopy;  Laterality: N/A;  . Excisional bx left breast mass/  i  &  d left breast abscess  03-24-2009  . Excision chronic left breast abscess  09-21-2010  . Cystoscopy/retrograde/ureteroscopy Bilateral 04/25/2013    Procedure: CYSTOSCOPY/ BILATERAL RETROGRADES; BLADDER BIOPSIES;  Surgeon: Alexis Frock, MD;  Location: [facility];  Service: Urology;  Laterality: Bilateral;  . Prostate biopsy N/A 04/25/2013    Procedure: BIOPSY TRANSRECTAL ULTRASONIC PROSTATE (TUBP);  Surgeon: Alexis Frock, MD;  Location: [facility];  Service: Urology;  Laterality: N/A;  . Left hip biopsy  10/2012  . Left axilla biopsy  04/2013  . Robot assisted laparoscopic radical prostatectomy N/A 08/18/2013    Procedure: ROBOTIC ASSISTED LAPAROSCOPIC RADICAL PROSTATECTOMY;  Surgeon: Alexis Frock, MD;  Location: [facility];  Service: Urology;  Laterality: N/A;  . Lymphadenectomy Bilateral 08/18/2013    Procedure: LYMPHADENECTOMY "PELVIC LYMPH NODE DISSECTION";  Surgeon: Alexis Frock, MD;  Location: [facility];  Service: Urology;  Laterality: Bilateral;  . Esophagogastroduodenoscopy (egd) with propofol N/A 07/29/2014    Procedure: ESOPHAGOGASTRODUODENOSCOPY (EGD) WITH PROPOFOL;  Surgeon: Lear Ng, MD;  Location: [facility];  Service: Endoscopy;  Laterality: N/A;  . Colonoscopy with propofol N/A 07/29/2014    Procedure: COLONOSCOPY WITH PROPOFOL;  Surgeon: Lear Ng, MD;  Location: [place];  Service: Endoscopy;  Laterality: N/A;  . Cardiac catheterization  03-23-2009  DR COOPER     NORMAL CORONARY ARTERIES  . Dental examination under anesthesia    . Cholecystectomy N/A 11/06/2014    Procedure: LAPAROSCOPIC CHOLECYSTECTOMY WITH INTRAOPERATIVE CHOLANGIOGRAM;  Surgeon: Judeth Horn, MD;  Location: [facility] OR;  Service: General;  Laterality: N/A;   Family History  Problem Relation Age of Onset  . Stroke Father   . Diabetes Father   . Cancer Father     brain cancer  . Hypertension Sister   . Cancer Maternal Uncle     prostate cancer  . Asthma Sister   . Asthma Father    Social History  Substance Use Topics  . Smoking status: Former Smoker -- 1.50 packs/day for 23 years    Types: Cigars, Cigarettes    Quit date: 05/22/2010  . Smokeless tobacco: Never Used  . Alcohol Use: No    Review of Systems    Allergies  Morphine and related; Penicillins; Sulfonamide derivatives; Tramadol; and Tylenol  Home Medications   Prior to Admission medications   Medication Sig Start Date End Date Taking? Authorizing Provider  albuterol (PROVENTIL HFA;VENTOLIN HFA) 108 (90 BASE) MCG/ACT inhaler Inhale 2 puffs into the lungs every 4 (four) hours as needed for wheezing or shortness of breath. 11/11/14   Noemi Chapel, MD  albuterol (PROVENTIL) (2.5 MG/3ML) 0.083% nebulizer solution Take 3 mLs (2.5 mg total) by nebulization every 6 (six) hours as needed for wheezing or shortness of breath. 11/11/14   Noemi Chapel, MD  aspirin EC 81 MG tablet Take 81 mg by mouth 3 (three) times a week. Monday, Tuesday and Wednesday    Historical Provider, MD  azithromycin (ZITHROMAX) 250 MG tablet Take 1 tablet (250 mg total) by mouth daily. Take first 2 tablets together, then 1 every day until finished. 03/09/15   Julianne Rice, MD  Beclomethasone Dipropionate (QNASL) 80 MCG/ACT AERS Place 1 spray into the nose 2 (two) times daily. 07/22/14   Tanda Rockers, MD  bisacodyl (DULCOLAX) 5 MG EC tablet Take 1 tablet (5 mg total) by mouth daily as needed for moderate constipation. 11/21/14   Charlesetta Shanks, MD   chlorpheniramine-HYDROcodone (TUSSIONEX) 10-8 MG/5ML SUER Take 5 mLs by mouth every 12 (twelve) hours as needed for cough. 03/09/15   Julianne Rice, MD  diclofenac sodium (VOLTAREN) 1 % GEL APPLY TO THE AFFECTED AREA TWICE DAILY AS NEEDED 02/05/15   Campbell Riches, MD  ferrous sulfate 325 (65 FE) MG tablet Take 1 tablet (325 mg total) by mouth daily with breakfast. 11/21/14   Charlesetta Shanks, MD  gabapentin (NEURONTIN) 300 MG capsule Take 1 capsule (300 mg total) by mouth 4 (four) times daily as needed (nerve pain). 09/02/14   Dennie Bible, NP  hydrOXYzine (ATARAX/VISTARIL) 25 MG tablet Take 1 tablet (25 mg total) by mouth every 6 (six) hours as needed for anxiety. 12/16/14   Campbell Riches, MD  ipratropium (ATROVENT) 0.02 % nebulizer solution Take 2.5 mLs (0.5 mg total) by nebulization 3 (three) times daily as needed for wheezing or shortness of breath. Mix with xopenex 02/13/14   Carly J Rivet, MD  loratadine (CLARITIN) 10 MG tablet Take 10 mg by mouth daily.     Historical Provider, MD  mometasone (NASONEX) 50 MCG/ACT nasal spray Place 2 sprays into the nose daily. 03/09/15   Julianne Rice, MD  mometasone-formoterol [facility]) 200-5 MCG/ACT AERO Take 2 puffs first thing in am and then another 2 puffs about 12 hours later. 07/22/14   Tanda Rockers, MD  Multiple Vitamin (MULTIVITAMIN WITH MINERALS) TABS tablet Take 1 tablet by mouth at bedtime.    Historical Provider, MD  mupirocin ointment (BACTROBAN) 2 % APPLY TO THE AFFECTED AREA TWICE DAILY FOR 2 WEEKS. PLACE COTTON BETWEEN TOES TO ALLOW BETTER AERATION PRN 02/19/15   Campbell Riches, MD  NORVIR 100 MG TABS tablet TAKE 1 TABLET BY MOUTH EVERY DAY WITH PREZISTA AND TRUVADA 03/11/15   Campbell Riches, MD  omeprazole (PRILOSEC) 40 MG capsule Take 40 mg by mouth at bedtime.    Historical Provider, MD  ondansetron (ZOFRAN) 4 MG tablet Take 1 tablet (4 mg total) by mouth every 6 (six) hours. 11/11/14   Quintella Reichert, MD  ondansetron (ZOFRAN) 4  MG tablet TAKE 1 TABLET BY MOUTH EVERY 8 HOURS AS NEEDED FOR NAUSEA OR VOMITING 03/11/15   Campbell Riches, MD  oxymetazoline [facility] NASAL SPRAY) 0.05 % nasal spray Place 2 sprays into both nostrils 2 (two) times daily. 03/09/15   Julianne Rice, MD  Polyvinyl Alcohol-Povidone (REFRESH OP) Place 1 drop into both eyes at bedtime.    Historical Provider, MD  PREZISTA 800 MG tablet TAKE 1 TABLET BY MOUTH EVERY DAY 03/11/15   Campbell Riches, MD  raltegravir (ISENTRESS) 400 MG tablet Take 800 mg by mouth at bedtime.    Historical Provider, MD  tizanidine (ZANAFLEX) 6 MG capsule Take 1 capsule (6 mg total) by mouth 3 (three) times daily as needed for muscle spasms. 01/17/15   Asencion Partridge Dohmeier, MD  traZODone (DESYREL) 50 MG tablet TAKE 1 TABLET BY MOUTH AT BEDTIME 01/19/15   Juliet Rude, MD  traZODone (DESYREL) 50 MG tablet TAKE 1 TABLET BY MOUTH AT BEDTIME 03/11/15   Carly J Rivet, MD  TRUVADA 200-300 MG per tablet TAKE 1 TABLET BY MOUTH EVERY DAY WITH PREZISTA AND NORVIR 07/27/14   Campbell Riches, MD  ZANAFLEX 6 MG capsule TAKE ONE CAPSULE BY MOUTH THREE TIMES DAILY AS NEEDED FOR MUSCLE SPASMS 02/16/15   Asencion Partridge Dohmeier, MD   BP 117/78 mmHg  Pulse 88  Temp(Src) 97.9 F (36.6 C) (Oral)  Resp 18  Wt 207 lb (93.895 kg)  SpO2 96% Physical Exam  Constitutional: He is oriented to person, place, and time. Vital signs are normal. He appears well-developed and well-nourished.  Non-toxic appearance. He does not appear ill. No distress.  HENT:  Head: Normocephalic and atraumatic.  Nose: Nose normal.  Mouth/Throat: Oropharynx is clear and moist. No oropharyngeal exudate.  Eyes: Conjunctivae and EOM are normal. Pupils are equal, round, and reactive to light. No scleral icterus.  Neck: Normal range of motion. Neck supple. No tracheal deviation, no edema, no erythema and normal range of motion present. No thyroid mass and no thyromegaly present.  Cardiovascular: Normal rate, regular rhythm, S1 normal, S2 normal,  normal heart sounds, intact distal pulses and normal pulses.  Exam reveals no  gallop and no friction rub.   No murmur heard. Pulses:      Radial pulses are 2+ on the right side, and 2+ on the left side.       Dorsalis pedis pulses are 2+ on the right side, and 2+ on the left side.  Pulmonary/Chest: Effort normal and breath sounds normal. No respiratory distress. He has no wheezes. He has no rhonchi. He has no rales.  Abdominal: Soft. Normal appearance and bowel sounds are normal. He exhibits no distension, no ascites and no mass. There is no hepatosplenomegaly. There is no tenderness. There is no rebound, no guarding and no CVA tenderness.  Musculoskeletal: Normal range of motion. He exhibits no edema or tenderness.  Lymphadenopathy:    He has no cervical adenopathy.  Neurological: He is alert and oriented to person, place, and time. He has normal strength. No cranial nerve deficit or sensory deficit.  Skin: Skin is warm, dry and intact. Rash noted. No petechiae noted. He is not diaphoretic. No erythema. No pallor.  Area of excoriated skin seen between the fifth and fourth toes on the left side, serous drainage noted, no warmth but there is mild erythema.  Psychiatric: He has a normal mood and affect. His behavior is normal. Judgment normal.  Nursing note and vitals reviewed.   ED Course  Procedures (including critical care time) Labs Review Labs Reviewed - No data to display  Imaging Review No results found. I have personally reviewed and evaluated these images and lab results as part of my medical decision-making.   EKG Interpretation None      MDM   Final diagnoses:  None   patient presents to the emergency department for an excoriated area of skin he is concerned for infection. Clear drainage was noted, I do not see any frank purulence. Sister in the room states that his symptoms have improved with antibodies in the past. We'll discharge home with mupirocin cream and  clindamycin. Primary care follow-up was advised within 3 days. He otherwise appears well and in no acute distress, as well as signs remain within his normal limits and he is safe for discharge.    Everlene Balls, MD 03/28/15 [phone]

## 2015-04-16 ENCOUNTER — Other Ambulatory Visit: Payer: Self-pay | Admitting: Infectious Diseases

## 2015-04-19 DIAGNOSIS — C61 Malignant neoplasm of prostate: Secondary | ICD-10-CM | POA: Diagnosis not present

## 2015-04-26 DIAGNOSIS — Y289XXA Contact with unspecified sharp object, undetermined intent, initial encounter: Secondary | ICD-10-CM | POA: Diagnosis not present

## 2015-04-26 DIAGNOSIS — R310 Gross hematuria: Secondary | ICD-10-CM | POA: Diagnosis not present

## 2015-05-04 ENCOUNTER — Other Ambulatory Visit: Payer: Medicare Other

## 2015-05-04 ENCOUNTER — Telehealth: Payer: Self-pay | Admitting: Hematology and Oncology

## 2015-05-04 NOTE — Telephone Encounter (Signed)
pt called to r/s lab visit...done....pt ok and aware of new d.t

## 2015-05-10 ENCOUNTER — Telehealth: Payer: Self-pay | Admitting: *Deleted

## 2015-05-10 NOTE — Telephone Encounter (Signed)
Patient's sister called to request an appt. Asked if it was something that he could be seen by his PCP and she said she feels it is related to his Hiv. She stated he has been having fevers and fatigue and was told by Dr. [name] if he ever starts feeling bad he should make an appt to come in. Dr. [name] does not have anything available; scheduled with Dr. Megan Salon for tomorrow.

## 2015-05-11 ENCOUNTER — Ambulatory Visit (INDEPENDENT_AMBULATORY_CARE_PROVIDER_SITE_OTHER): Payer: Medicare Other | Admitting: Internal Medicine

## 2015-05-11 ENCOUNTER — Other Ambulatory Visit (HOSPITAL_BASED_OUTPATIENT_CLINIC_OR_DEPARTMENT_OTHER): Payer: Medicare Other

## 2015-05-11 ENCOUNTER — Encounter: Payer: Self-pay | Admitting: Internal Medicine

## 2015-05-11 ENCOUNTER — Other Ambulatory Visit: Payer: Self-pay | Admitting: *Deleted

## 2015-05-11 VITALS — BP 130/86 | HR 77 | Temp 98.3°F | Ht 69.0 in | Wt 211.1 lb

## 2015-05-11 DIAGNOSIS — D509 Iron deficiency anemia, unspecified: Secondary | ICD-10-CM | POA: Diagnosis not present

## 2015-05-11 DIAGNOSIS — M898X5 Other specified disorders of bone, thigh: Secondary | ICD-10-CM

## 2015-05-11 DIAGNOSIS — R059 Cough, unspecified: Secondary | ICD-10-CM

## 2015-05-11 DIAGNOSIS — B20 Human immunodeficiency virus [HIV] disease: Secondary | ICD-10-CM

## 2015-05-11 DIAGNOSIS — R05 Cough: Secondary | ICD-10-CM | POA: Diagnosis not present

## 2015-05-11 LAB — CBC
HCT: 41.4 % (ref 39.0–52.0)
Hemoglobin: 14.3 g/dL (ref 13.0–17.0)
MCH: 32.8 pg (ref 26.0–34.0)
MCHC: 34.5 g/dL (ref 30.0–36.0)
MCV: 95 fL (ref 78.0–100.0)
MPV: 12.1 fL (ref 8.6–12.4)
Platelets: 151 10*3/uL (ref 150–400)
RBC: 4.36 MIL/uL (ref 4.22–5.81)
RDW: 13.6 % (ref 11.5–15.5)
WBC: 5.2 10*3/uL (ref 4.0–10.5)

## 2015-05-11 LAB — COMPREHENSIVE METABOLIC PANEL
ALT: 14 U/L (ref 9–46)
AST: 19 U/L (ref 10–35)
Albumin: 4.1 g/dL (ref 3.6–5.1)
Alkaline Phosphatase: 131 U/L — ABNORMAL HIGH (ref 40–115)
BUN: 11 mg/dL (ref 7–25)
CO2: 22 mmol/L (ref 20–31)
Calcium: 9.3 mg/dL (ref 8.6–10.3)
Chloride: 107 mmol/L (ref 98–110)
Creat: 1.09 mg/dL (ref 0.70–1.33)
Glucose, Bld: 85 mg/dL (ref 65–99)
Potassium: 4 mmol/L (ref 3.5–5.3)
Sodium: 140 mmol/L (ref 135–146)
Total Bilirubin: 1.1 mg/dL (ref 0.2–1.2)
Total Protein: 6.8 g/dL (ref 6.1–8.1)

## 2015-05-11 LAB — CBC & DIFF AND RETIC
BASO%: 0.9 % (ref 0.0–2.0)
Basophils Absolute: 0 10*3/uL (ref 0.0–0.1)
EOS%: 11.8 % — ABNORMAL HIGH (ref 0.0–7.0)
Eosinophils Absolute: 0.6 10*3/uL — ABNORMAL HIGH (ref 0.0–0.5)
HCT: 42.1 % (ref 38.4–49.9)
HGB: 14 g/dL (ref 13.0–17.1)
Immature Retic Fract: 10 % (ref 3.00–10.60)
LYMPH%: 33 % (ref 14.0–49.0)
MCH: 32.4 pg (ref 27.2–33.4)
MCHC: 33.3 g/dL (ref 32.0–36.0)
MCV: 97.5 fL (ref 79.3–98.0)
MONO#: 0.3 10*3/uL (ref 0.1–0.9)
MONO%: 6.2 % (ref 0.0–14.0)
NEUT#: 2.2 10*3/uL (ref 1.5–6.5)
NEUT%: 48.1 % (ref 39.0–75.0)
Platelets: 126 10*3/uL — ABNORMAL LOW (ref 140–400)
RBC: 4.32 10*6/uL (ref 4.20–5.82)
RDW: 12.9 % (ref 11.0–14.6)
Retic %: 1.91 % — ABNORMAL HIGH (ref 0.80–1.80)
Retic Ct Abs: 82.51 10*3/uL (ref 34.80–93.90)
WBC: 4.7 10*3/uL (ref 4.0–10.3)
lymph#: 1.5 10*3/uL (ref 0.9–3.3)

## 2015-05-11 LAB — IRON AND TIBC CHCC
%SAT: >100 % (ref 20–?)
Iron: 234 ug/dL — ABNORMAL HIGH (ref 42–163)
TIBC: 219 ug/dL (ref 202–409)
UIBC: <1 ug/dL (ref 117–376)

## 2015-05-11 LAB — FERRITIN CHCC: Ferritin: 45 ng/ml (ref 22–316)

## 2015-05-11 MED ORDER — ELVITEG-COBIC-EMTRICIT-TENOFAF 150-150-200-10 MG PO TABS: 1.0000 | ORAL_TABLET | Freq: Every day | ORAL | Status: DC

## 2015-05-11 MED ORDER — HYDROCOD POLST-CPM POLST ER 10-8 MG/5ML PO SUER: 5.0000 mL | Freq: Two times a day (BID) | ORAL | Status: DC | PRN

## 2015-05-12 ENCOUNTER — Ambulatory Visit: Payer: Medicare Other | Admitting: Internal Medicine

## 2015-05-12 LAB — T-HELPER CELL (CD4) - (RCID CLINIC ONLY)
CD4 % Helper T Cell: 22 % — ABNORMAL LOW (ref 33–55)
CD4 T Cell Abs: 360 /uL — ABNORMAL LOW (ref 400–2700)

## 2015-05-12 LAB — HIV-1 RNA QUANT-NO REFLEX-BLD
HIV 1 RNA Quant: <20 copies/mL (ref <20)
HIV-1 RNA Quant, Log: <1.3 Log copies/mL (ref <1.30)

## 2015-05-12 NOTE — Progress Notes (Signed)
Patient Active Problem List   Diagnosis Date Noted  . Unexplained night sweats 01/21/2015  . Cough 01/18/2015  . Folliculitis 76/22/6333  . Hypochromic microcytic anemia 12/16/2014  . Allergic rhinitis 10/24/2014  . Hereditary and idiopathic peripheral neuropathy 09/02/2014  . GERD (gastroesophageal reflux disease) 07/29/2014  . COPD GOLD III 07/22/2014  . Recurrent knee pain 04/27/2014  . Hx of primary hypertension 04/27/2014  . Atypical chest pain 02/13/2014  . Iron deficiency anemia   . H/O prostate cancer   . Lytic bone lesion of hip 08/22/2012  . Asthma 01/23/2012  . Insomnia 05/11/2011  . Headache 08/24/2010  . Mood disorder in conditions classified elsewhere 06/14/2009  . Human immunodeficiency virus (HIV) disease ([place]) 04/19/2009    Patient's Medications  New Prescriptions   ELVITEGRAVIR-COBICISTAT-EMTRICITABINE-TENOFOVIR (GENVOYA) 150-150-200-10 MG TABS TABLET    Take 1 tablet by mouth daily with breakfast.  Previous Medications   ALBUTEROL (PROVENTIL HFA;VENTOLIN HFA) 108 (90 BASE) MCG/ACT INHALER    Inhale 2 puffs into the lungs every 4 (four) hours as needed for wheezing or shortness of breath.   ALBUTEROL (PROVENTIL) (2.5 MG/3ML) 0.083% NEBULIZER SOLUTION    Take 3 mLs (2.5 mg total) by nebulization every 6 (six) hours as needed for wheezing or shortness of breath.   ASPIRIN EC 81 MG TABLET    Take 81 mg by mouth 3 (three) times a week. Monday, Tuesday and Wednesday   AZITHROMYCIN (ZITHROMAX) 250 MG TABLET    Take 1 tablet (250 mg total) by mouth daily. Take first 2 tablets together, then 1 every day until finished.   BECLOMETHASONE DIPROPIONATE (QNASL) 80 MCG/ACT AERS    Place 1 spray into the nose 2 (two) times daily.   BISACODYL (DULCOLAX) 5 MG EC TABLET    Take 1 tablet (5 mg total) by mouth daily as needed for moderate constipation.   DICLOFENAC SODIUM (VOLTAREN) 1 % GEL    APPLY TO THE AFFECTED AREA TWICE DAILY AS NEEDED   FERROUS SULFATE 325 (65  FE) MG TABLET    Take 1 tablet (325 mg total) by mouth daily with breakfast.   GABAPENTIN (NEURONTIN) 300 MG CAPSULE    Take 1 capsule (300 mg total) by mouth 4 (four) times daily as needed (nerve pain).   HYDROXYZINE (ATARAX/VISTARIL) 25 MG TABLET    TAKE 1 TABLET(25 MG) BY MOUTH EVERY 6 HOURS AS NEEDED FOR ANXIETY   IPRATROPIUM (ATROVENT) 0.02 % NEBULIZER SOLUTION    Take 2.5 mLs (0.5 mg total) by nebulization 3 (three) times daily as needed for wheezing or shortness of breath. Mix with xopenex   LORATADINE (CLARITIN) 10 MG TABLET    Take 10 mg by mouth daily.    MOMETASONE (NASONEX) 50 MCG/ACT NASAL SPRAY    Place 2 sprays into the nose daily.   MOMETASONE-FORMOTEROL (DULERA) 200-5 MCG/ACT AERO    Take 2 puffs first thing in am and then another 2 puffs about 12 hours later.   MULTIPLE VITAMIN (MULTIVITAMIN WITH MINERALS) TABS TABLET    Take 1 tablet by mouth at bedtime.   MUPIROCIN OINTMENT (BACTROBAN) 2 %    APPLY TO THE AFFECTED AREA TWICE DAILY FOR 2 WEEKS. PLACE COTTON BETWEEN TOES TO ALLOW BETTER AERATION PRN   OMEPRAZOLE (PRILOSEC) 40 MG CAPSULE    Take 40 mg by mouth at bedtime.   ONDANSETRON (ZOFRAN) 4 MG TABLET    Take 1 tablet (4 mg total) by mouth every 6 (six)  hours.   ONDANSETRON (ZOFRAN) 4 MG TABLET    TAKE 1 TABLET BY MOUTH EVERY 8 HOURS AS NEEDED FOR NAUSEA OR VOMITING   OXYCODONE (ROXICODONE) 5 MG IMMEDIATE RELEASE TABLET    Take 1 tablet (5 mg total) by mouth 2 (two) times daily as needed for severe pain.   OXYMETAZOLINE (AFRIN NASAL SPRAY) 0.05 % NASAL SPRAY    Place 2 sprays into both nostrils 2 (two) times daily.   POLYVINYL ALCOHOL-POVIDONE (REFRESH OP)    Place 1 drop into both eyes at bedtime.   PREZISTA 800 MG TABLET    TAKE 1 TABLET BY MOUTH EVERY DAY   TIZANIDINE (ZANAFLEX) 6 MG CAPSULE    Take 1 capsule (6 mg total) by mouth 3 (three) times daily as needed for muscle spasms.   TRAZODONE (DESYREL) 50 MG TABLET    TAKE 1 TABLET BY MOUTH AT BEDTIME   TRAZODONE (DESYREL)  50 MG TABLET    TAKE 1 TABLET BY MOUTH AT BEDTIME   ZANAFLEX 6 MG CAPSULE    TAKE ONE CAPSULE BY MOUTH THREE TIMES DAILY AS NEEDED FOR MUSCLE SPASMS  Modified Medications   Modified Medication Previous Medication   CHLORPHENIRAMINE-HYDROCODONE (TUSSIONEX) 10-8 MG/5ML SUER chlorpheniramine-HYDROcodone (TUSSIONEX) 10-8 MG/5ML SUER      Take 5 mLs by mouth every 12 (twelve) hours as needed for cough.    Take 5 mLs by mouth every 12 (twelve) hours as needed for cough.  Discontinued Medications   ISENTRESS 400 MG TABLET    TAKE 1 TABLET BY MOUTH TWICE DAILY   NORVIR 100 MG TABS TABLET    TAKE 1 TABLET BY MOUTH EVERY DAY WITH PREZISTA AND TRUVADA   RALTEGRAVIR (ISENTRESS) 400 MG TABLET    Take 800 mg by mouth at bedtime.   TRUVADA 200-300 MG PER TABLET    TAKE 1 TABLET BY MOUTH EVERY DAY WITH PREZISTA AND NORVIR    Subjective: Casey Brennan is seen on a work in basis. His sister is with him today. He has had recent problems with cough and sinus congestion. 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 He often sleeps in his cold truck cab where there are gas fumes. He is also had some problem with headache and right lower back pain. He is requesting a B12 injection and Tussionex. He denies missing any doses of his Truvada, Isentress, Prezista were Norvir.   Review of Systems: Review of Systems  Constitutional: Positive for malaise/fatigue. Negative for fever, chills, weight loss and diaphoresis.  HENT: Negative for sore throat.   Respiratory: Positive for cough. Negative for sputum production and shortness of breath.   Cardiovascular: Negative for chest pain.  Gastrointestinal: Negative for nausea, vomiting and diarrhea.  Genitourinary: Negative for dysuria and frequency.  Musculoskeletal: Positive for back pain. Negative for myalgias and joint pain.  Skin: Negative for rash.  Neurological: Negative for focal weakness and headaches.    Psychiatric/Behavioral: Negative for depression and substance abuse. The patient is not nervous/anxious.     Past Medical History  Diagnosis Date  . HIV (human immunodeficiency virus infection) ([place]) 1988  . Chronic anemia     normocytic  . Chronic folliculitis   . Gross hematuria   . Elevated PSA   . H/O pericarditis     2010--  myopercarditis--  resolved  . History of concussion     2012  &  2013  RESIDUAL HA'S --  RESOLVED  . Axillary  lymphadenopathy   . History of MRSA infection 2010    infected boil  . History of gastric ulcer   . Lytic bone lesion of hip     WORK-UP DONE BY ONCOLOGIST DR HA --  NOT MALIGNANT  . Wears glasses   . Headache(784.0)     HX SEVERE FRONTAL HA'S  . Ulcer     hx of gastric  . Asthma     very rare  . Arthritis     r knee   . GERD (gastroesophageal reflux disease)   . Bronchitis   . Post concussion syndrome     resolved  . Prostate cancer ([place]) 04/25/13    gleason 3+3=6, volume 30 gm  . Environmental and seasonal allergies   . Pneumonia     hx of  . HCAP (healthcare-associated pneumonia) 11/19/2014  . Kidney stones     Social History  Substance Use Topics  . Smoking status: Former Smoker -- 1.50 packs/day for 23 years    Types: Cigars, Cigarettes    Quit date: 05/22/2010  . Smokeless tobacco: Never Used  . Alcohol Use: No    Family History  Problem Relation Age of Onset  . Stroke Father   . Diabetes Father   . Cancer Father     brain cancer  . Hypertension Sister   . Cancer Maternal Uncle     prostate cancer  . Asthma Sister   . Asthma Father     Allergies  Allergen Reactions  . Morphine And Related Shortness Of Breath, Itching and Other (See Comments)     chest pain  . Penicillins Shortness Of Breath and Rash  . Sulfonamide Derivatives Shortness Of Breath and Rash  . Tramadol Itching and Nausea And Vomiting  . Tylenol [Acetaminophen] Rash    Objective:  Filed Vitals:   05/11/15 0902  BP: 130/86  Pulse: 77   Temp: 98.3 F (36.8 C)  TempSrc: Oral  Height: 5\' 9"  (1.753 m)  Weight: 211 lb 1.9 oz (95.763 kg)   Body mass index is 31.16 kg/(m^2).  Physical Exam  Constitutional: He is oriented to person, place, and time.  Eyes: Conjunctivae are normal.  Cardiovascular: Normal rate and regular rhythm.   No murmur heard. Pulmonary/Chest: Effort normal and breath sounds normal. No respiratory distress. He has no wheezes. He has no rales.  Abdominal: Soft. He exhibits no mass. There is no tenderness.  Musculoskeletal: Normal range of motion.  He has mild tenderness with palpation of his right lower back but no other evidence of inflammation.  Neurological: He is alert and oriented to person, place, and time. Gait normal.  Skin: No rash noted.  Psychiatric: Mood and affect normal.    Lab Results Lab Results  Component Value Date   WBC 4.7 05/11/2015   HGB 14.0 05/11/2015   HCT 42.1 05/11/2015   MCV 97.5 05/11/2015   PLT 126* 05/11/2015    Lab Results  Component Value Date   CREATININE 1.09 05/11/2015   BUN 11 05/11/2015   NA 140 05/11/2015   K 4.0 05/11/2015   CL 107 05/11/2015   CO2 22 05/11/2015    Lab Results  Component Value Date   ALT 14 05/11/2015   AST 19 05/11/2015   ALKPHOS 131* 05/11/2015   BILITOT 1.1 05/11/2015    Lab Results  Component Value Date   CHOL 153 12/02/2014   HDL 57 12/02/2014   LDLCALC 78 12/02/2014   TRIG 92 12/02/2014   CHOLHDL 2.7  12/02/2014    Lab Results HIV 1 RNA QUANT (copies/mL)  Date Value  12/02/2014 <20  11/19/2014 QNSRP  04/14/2014 <20   CD4 T CELL ABS (/uL)  Date Value  12/02/2014 340*  11/19/2014 370*  04/14/2014 490      Problem List Items Addressed This Visit      Unprioritized   Cough - Primary    He has had some recent bronchitis and possibly sinusitis but no evidence of pneumonia. He is quite fatigued and run down. I suggested that he back off from driving as much until he fully recovers.      Relevant  Medications   chlorpheniramine-HYDROcodone (TUSSIONEX) 10-8 MG/5ML SUER   Human immunodeficiency virus (HIV) disease ([place])    His infection has been under good control and his adherence to his 4 drug regimen sounds like it is excellent. I talked to him at length and he also spoke with our ID pharmacist, Milus Glazier, about a safer and simpler regimen of Genvoya plus Prezista. He has had some mild renal insufficiency intermittently in the past few years which could be related to the tenofovir component of Truvada. He has an upcoming appointment with my partner, Dr. Lita Mains, and will discuss the switch. He will get repeat lab work today.      Relevant Medications   elvitegravir-cobicistat-emtricitabine-tenofovir (GENVOYA) 150-150-200-10 MG TABS tablet   Other Relevant Orders   T-helper cell (CD4)- ([facility] clinic only)   HIV 1 RNA quant-no reflex-bld   CBC (Completed)   Comprehensive metabolic panel (Completed)        Michel Bickers, MD [facility] [place] [phone] pager   [phone] cell 05/12/2015, 12:24 PM

## 2015-05-12 NOTE — Assessment & Plan Note (Signed)
His infection has been under good control and his adherence to his 4 drug regimen sounds like it is excellent. I talked to him at length and he also spoke with our ID pharmacist, Milus Glazier, about a safer and simpler regimen of Genvoya plus Prezista. He has had some mild renal insufficiency intermittently in the past few years which could be related to the tenofovir component of Truvada. He has an upcoming appointment with my partner, Dr. Lita Mains, and will discuss the switch. He will get repeat lab work today.

## 2015-05-12 NOTE — Assessment & Plan Note (Signed)
He has had some recent bronchitis and possibly sinusitis but no evidence of pneumonia. He is quite fatigued and run down. I suggested that he back off from driving as much until he fully recovers.

## 2015-05-12 NOTE — Assessment & Plan Note (Addendum)
He has chronic headaches that are probably aggravated by his recent upper respiratory infection and fatigue. I encouraged him to continue symptomatic therapy.

## 2015-05-13 LAB — SPEP & IFE WITH QIG
Albumin ELP: 4.1 g/dL (ref 3.8–4.8)
Alpha-1-Globulin: 0.3 g/dL (ref 0.2–0.3)
Alpha-2-Globulin: 0.5 g/dL (ref 0.5–0.9)
Beta 2: 0.4 g/dL (ref 0.2–0.5)
Beta Globulin: 0.3 g/dL — ABNORMAL LOW (ref 0.4–0.6)
Gamma Globulin: 1.2 g/dL (ref 0.8–1.7)
IgA: 229 mg/dL (ref 68–379)
IgG (Immunoglobin G), Serum: 1210 mg/dL (ref 650–1600)
IgM, Serum: 24 mg/dL — ABNORMAL LOW (ref 41–251)
Total Protein, Serum Electrophoresis: 6.7 g/dL (ref 6.1–8.1)

## 2015-05-13 LAB — SEDIMENTATION RATE: Sed Rate: 1 mm/hr (ref 0–20)

## 2015-05-13 LAB — KAPPA/LAMBDA LIGHT CHAINS
Kappa free light chain: 3.18 mg/dL — ABNORMAL HIGH (ref 0.33–1.94)
Kappa:Lambda Ratio: 1.67 — ABNORMAL HIGH (ref 0.26–1.65)
Lambda Free Lght Chn: 1.9 mg/dL (ref 0.57–2.63)

## 2015-05-13 LAB — BETA 2 MICROGLOBULIN, SERUM: Beta-2 Microglobulin: 1.98 mg/L (ref <=2.51)

## 2015-05-14 ENCOUNTER — Ambulatory Visit (HOSPITAL_BASED_OUTPATIENT_CLINIC_OR_DEPARTMENT_OTHER): Payer: Medicare Other | Admitting: Hematology and Oncology

## 2015-05-14 ENCOUNTER — Encounter: Payer: Self-pay | Admitting: Hematology and Oncology

## 2015-05-14 VITALS — BP 128/82 | HR 88 | Temp 98.2°F | Resp 18 | Ht 69.0 in | Wt 212.6 lb

## 2015-05-14 DIAGNOSIS — J329 Chronic sinusitis, unspecified: Secondary | ICD-10-CM | POA: Diagnosis not present

## 2015-05-14 DIAGNOSIS — R059 Cough, unspecified: Secondary | ICD-10-CM

## 2015-05-14 DIAGNOSIS — R768 Other specified abnormal immunological findings in serum: Secondary | ICD-10-CM | POA: Diagnosis not present

## 2015-05-14 DIAGNOSIS — B20 Human immunodeficiency virus [HIV] disease: Secondary | ICD-10-CM

## 2015-05-14 DIAGNOSIS — D509 Iron deficiency anemia, unspecified: Secondary | ICD-10-CM | POA: Diagnosis not present

## 2015-05-14 DIAGNOSIS — J328 Other chronic sinusitis: Secondary | ICD-10-CM

## 2015-05-14 DIAGNOSIS — R05 Cough: Secondary | ICD-10-CM | POA: Diagnosis not present

## 2015-05-14 HISTORY — DX: Chronic sinusitis, unspecified: J32.9

## 2015-05-14 NOTE — Progress Notes (Signed)
[facility] OFFICE PROGRESS NOTE  Patient Care Team: Juliet Rude, MD as PCP - General Campbell Riches, MD as PCP - Infectious Diseases (Infectious Diseases) Bjorn Loser, MD as Attending Physician (Urology) Wilford Corner, MD as Attending Physician (Gastroenterology) Rolm Bookbinder, MD as Attending Physician (General Surgery) Elmarie Shiley, MD (Nephrology) Nobie Putnam, MD (Hematology and Oncology)  SUMMARY OF ONCOLOGIC HISTORY:  This is a gentleman accompanied by his sister, April today. I reviewed his records and collaborated the history with the patient. He had background history of HIV infection and recurrent infection in the past. Recently, he started to have gross hematuria. CT scan of the abdomen and pelvis revealed abnormal lytic lesions leading to multiple workup. Biopsy was negative. Workup for myeloma was negative. He has lymphadenopathy in the left axilla which subsequently resolved. The patient was subsequently found to have prostate cancer and underwent prostatectomy without the need for adjuvant treatment. He had EGD and colonoscopy performed in January 2016 for anemia and was found to have mild gastritis. He takes iron supplement and is doing well.  INTERVAL HISTORY: Please see below for problem oriented charting. He complained of significant cough and sinus congestion with nasal drip. Most of the cough is nonproductive but he occasionally will produce whitish phlegm. Denies hemoptysis. Denies fevers or chills. He is compliant taking all his HIV medications. The patient denies any recent signs or symptoms of bleeding such as spontaneous epistaxis, hematuria or hematochezia.  REVIEW OF SYSTEMS:   Constitutional: Denies fevers, chills or abnormal weight loss Eyes: Denies blurriness of vision Ears, nose, mouth, throat, and face: Denies mucositis or sore throat Cardiovascular: Denies palpitation, chest discomfort or lower extremity  swelling Gastrointestinal:  Denies nausea, heartburn or change in bowel habits Skin: Denies abnormal skin rashes Lymphatics: Denies new lymphadenopathy or easy bruising Neurological:Denies numbness, tingling or new weaknesses Behavioral/Psych: Mood is stable, no new changes  All other systems were reviewed with the patient and are negative.  I have reviewed the past medical history, past surgical history, social history and family history with the patient and they are unchanged from previous note.  ALLERGIES:  is allergic to morphine and related; penicillins; sulfonamide derivatives; tramadol; and tylenol.  MEDICATIONS:  Current Outpatient Prescriptions  Medication Sig Dispense Refill  . albuterol (PROVENTIL HFA;VENTOLIN HFA) 108 (90 BASE) MCG/ACT inhaler Inhale 2 puffs into the lungs every 4 (four) hours as needed for wheezing or shortness of breath. 1 Inhaler 3  . albuterol (PROVENTIL) (2.5 MG/3ML) 0.083% nebulizer solution Take 3 mLs (2.5 mg total) by nebulization every 6 (six) hours as needed for wheezing or shortness of breath. 75 mL 12  . aspirin EC 81 MG tablet Take 81 mg by mouth 3 (three) times a week. Monday, Tuesday and Wednesday    . chlorpheniramine-HYDROcodone (TUSSIONEX) 10-8 MG/5ML SUER Take 5 mLs by mouth every 12 (twelve) hours as needed for cough. 115 mL 0  . diclofenac sodium (VOLTAREN) 1 % GEL APPLY TO THE AFFECTED AREA TWICE DAILY AS NEEDED 100 g 3  . ferrous sulfate 325 (65 FE) MG tablet Take 1 tablet (325 mg total) by mouth daily with breakfast. 30 tablet 3  . gabapentin (NEURONTIN) 300 MG capsule Take 1 capsule (300 mg total) by mouth 4 (four) times daily as needed (nerve pain). 120 capsule 7  . hydrOXYzine (ATARAX/VISTARIL) 25 MG tablet TAKE 1 TABLET(25 MG) BY MOUTH EVERY 6 HOURS AS NEEDED FOR ANXIETY 30 tablet 1  . ipratropium (ATROVENT) 0.02 % nebulizer  solution Take 2.5 mLs (0.5 mg total) by nebulization 3 (three) times daily as needed for wheezing or shortness of  breath. Mix with xopenex 75 mL 1  . loratadine (CLARITIN) 10 MG tablet Take 10 mg by mouth daily.     . mometasone (NASONEX) 50 MCG/ACT nasal spray Place 2 sprays into the nose daily. 17 g 12  . mometasone-formoterol (DULERA) 200-5 MCG/ACT AERO Take 2 puffs first thing in am and then another 2 puffs about 12 hours later. 1 Inhaler 11  . Multiple Vitamin (MULTIVITAMIN WITH MINERALS) TABS tablet Take 1 tablet by mouth at bedtime.    . mupirocin ointment (BACTROBAN) 2 % APPLY TO THE AFFECTED AREA TWICE DAILY FOR 2 WEEKS. PLACE COTTON BETWEEN TOES TO ALLOW BETTER AERATION PRN 66 g 0  . NORVIR 100 MG TABS tablet Take 100 mg by mouth daily.  3  . omeprazole (PRILOSEC) 40 MG capsule Take 40 mg by mouth at bedtime.    . ondansetron (ZOFRAN) 4 MG tablet TAKE 1 TABLET BY MOUTH EVERY 8 HOURS AS NEEDED FOR NAUSEA OR VOMITING 60 tablet 3  . Polyvinyl Alcohol-Povidone (REFRESH OP) Place 1 drop into both eyes at bedtime.    [name] PREZISTA 800 MG tablet TAKE 1 TABLET BY MOUTH EVERY DAY 30 tablet 3  . tizanidine (ZANAFLEX) 6 MG capsule Take 1 capsule (6 mg total) by mouth 3 (three) times daily as needed for muscle spasms. 270 capsule 2  . traZODone (DESYREL) 50 MG tablet TAKE 1 TABLET BY MOUTH AT BEDTIME 30 tablet 2  . TRUVADA 200-300 MG tablet Take 200 mg by mouth daily.  6   No current facility-administered medications for this visit.    PHYSICAL EXAMINATION: ECOG PERFORMANCE STATUS: 1 - Symptomatic but completely ambulatory  Filed Vitals:   05/14/15 1341  BP: 128/82  Pulse: 88  Temp: 98.2 F (36.8 C)  Resp: 18   Filed Weights   05/14/15 1341  Weight: 212 lb 9.6 oz (96.435 kg)    GENERAL:alert, no distress and comfortable SKIN: skin color, texture, turgor are normal, no rashes or significant lesions EYES: normal, Conjunctiva are pink and non-injected, sclera clear OROPHARYNX:no exudate, no erythema and lips, buccal mucosa, and tongue normal  NECK: supple, thyroid normal size, non-tender, without  nodularity LYMPH:  no palpable lymphadenopathy in the cervical, axillary or inguinal LUNGS: clear to auscultation and percussion with normal breathing effort HEART: regular rate & rhythm and no murmurs and no lower extremity edema ABDOMEN:abdomen soft, non-tender and normal bowel sounds Musculoskeletal:no cyanosis of digits and no clubbing  NEURO: alert & oriented x 3 with fluent speech, no focal motor/sensory deficits  LABORATORY DATA:  I have reviewed the data as listed    Component Value Date/Time   NA 140 05/11/2015 1012   NA 142 02/24/2013 0848   K 4.0 05/11/2015 1012   K 3.4* 02/24/2013 0848   CL 107 05/11/2015 1012   CO2 22 05/11/2015 1012   CO2 24 02/24/2013 0848   GLUCOSE 85 05/11/2015 1012   GLUCOSE 121 02/24/2013 0848   GLUCOSE 93 03/25/2009   BUN 11 05/11/2015 1012   BUN 11.9 02/24/2013 0848   CREATININE 1.09 05/11/2015 1012   CREATININE 1.43* 03/09/2015 0232   CREATININE 1.3 02/24/2013 0848   CALCIUM 9.3 05/11/2015 1012   CALCIUM 8.9 02/24/2013 0848   PROT 6.8 05/11/2015 1012   PROT 7.1 02/24/2013 0848   ALBUMIN 4.1 05/11/2015 1012   ALBUMIN 3.6 02/24/2013 0848   ALBUMIN 39.7 08/22/2012  1200   AST 19 05/11/2015 1012   AST 14 02/24/2013 0848   ALT 14 05/11/2015 1012   ALT 11 02/24/2013 0848   ALKPHOS 131* 05/11/2015 1012   ALKPHOS 94 02/24/2013 0848   BILITOT 1.1 05/11/2015 1012   BILITOT 1.40* 02/24/2013 0848   GFRNONAA 55* 03/09/2015 0232   GFRNONAA 81 10/13/2013 1155   GFRAA >60 03/09/2015 0232   GFRAA >89 10/13/2013 1155    No results found for: SPEP, UPEP  Lab Results  Component Value Date   WBC 4.7 05/11/2015   NEUTROABS 2.2 05/11/2015   HGB 14.0 05/11/2015   HCT 42.1 05/11/2015   MCV 97.5 05/11/2015   PLT 126* 05/11/2015      Chemistry      Component Value Date/Time   NA 140 05/11/2015 1012   NA 142 02/24/2013 0848   K 4.0 05/11/2015 1012   K 3.4* 02/24/2013 0848   CL 107 05/11/2015 1012   CO2 22 05/11/2015 1012   CO2 24  02/24/2013 0848   BUN 11 05/11/2015 1012   BUN 11.9 02/24/2013 0848   CREATININE 1.09 05/11/2015 1012   CREATININE 1.43* 03/09/2015 0232   CREATININE 1.3 02/24/2013 0848      Component Value Date/Time   CALCIUM 9.3 05/11/2015 1012   CALCIUM 8.9 02/24/2013 0848   ALKPHOS 131* 05/11/2015 1012   ALKPHOS 94 02/24/2013 0848   AST 19 05/11/2015 1012   AST 14 02/24/2013 0848   ALT 14 05/11/2015 1012   ALT 11 02/24/2013 0848   BILITOT 1.1 05/11/2015 1012   BILITOT 1.40* 02/24/2013 0848     ASSESSMENT & PLAN:  Hypochromic microcytic anemia The patient had history of anemia but since then his anemia has resolved. Iron studies are adequate. As soon as he complete current treatment, I recommend stopping iron supplement  Elevated serum immunoglobulin free light chains He was referred here along time ago because of elevated light chains and abnormal SPEP. All subsequent tests failed to reveal monoclonal paraproteinemia. I suspect the elevated light chains immunoglobulins are reflective of active HIV disease which has since been adequately controlled. Sometimes, abnormal serum creatinine and kidney function can also cause retention of light chains. The patient does not need for to return for future follow-up  Human immunodeficiency virus (HIV) disease he will continue current medical management. I recommend close follow-up with his infectious disease doctor for medication adjustment.     Cough He has chronic cough for week. He just saw his infectious disease physician who felt that the patient does not have bacterial infection. I have to agree with the assessment. The patient also was noted to have history of asthma. I recommend pulmonology evaluation but the sister felt that the last visit was not a good visit. He also has a lot of allergic rhinitis and sinus congestion with nasal drip. I recommend he continue Flonase and I would recommend ENT consultation.  Sinusitis, chronic He  has chronic sinusitis and sinus drainage. Recent blood work showed evidence of mild eosinophilia that could be related to allergies. The sister felt that the patient would benefit from ENT consultation and will refer him to be seen   Orders Placed This Encounter  Procedures  . Ambulatory referral to ENT    Referral Priority:  Routine    Referral Type:  Consultation    Referral Reason:  Specialty Services Required    Requested Specialty:  Otolaryngology    Number of Visits Requested:  1   All questions were  answered. The patient knows to call the clinic with any problems, questions or concerns. No barriers to learning was detected. I spent 20 minutes counseling the patient face to face. The total time spent in the appointment was 25 minutes and more than 50% was on counseling and review of test results     [facility], [place], MD 05/14/2015 2:43 PM

## 2015-05-14 NOTE — Assessment & Plan Note (Signed)
He has chronic sinusitis and sinus drainage. Recent blood work showed evidence of mild eosinophilia that could be related to allergies. The sister felt that the patient would benefit from ENT consultation and will refer him to be seen

## 2015-05-14 NOTE — Assessment & Plan Note (Signed)
The patient had history of anemia but since then his anemia has resolved. Iron studies are adequate. As soon as he complete current treatment, I recommend stopping iron supplement

## 2015-05-14 NOTE — Assessment & Plan Note (Signed)
He has chronic cough for week. He just saw his infectious disease physician who felt that the patient does not have bacterial infection. I have to agree with the assessment. The patient also was noted to have history of asthma. I recommend pulmonology evaluation but the sister felt that the last visit was not a good visit. He also has a lot of allergic rhinitis and sinus congestion with nasal drip. I recommend he continue Flonase and I would recommend ENT consultation.

## 2015-05-14 NOTE — Assessment & Plan Note (Signed)
He was referred here along time ago because of elevated light chains and abnormal SPEP. All subsequent tests failed to reveal monoclonal paraproteinemia. I suspect the elevated light chains immunoglobulins are reflective of active HIV disease which has since been adequately controlled. Sometimes, abnormal serum creatinine and kidney function can also cause retention of light chains. The patient does not need for to return for future follow-up

## 2015-05-14 NOTE — Assessment & Plan Note (Signed)
he will continue current medical management. I recommend close follow-up with his infectious disease doctor for medication adjustment.  

## 2015-05-16 ENCOUNTER — Telehealth: Payer: Self-pay | Admitting: Hematology and Oncology

## 2015-05-16 NOTE — Telephone Encounter (Signed)
Spoke with patient and he is aware to call [facility] for sinus issue

## 2015-06-01 ENCOUNTER — Encounter: Payer: Self-pay | Admitting: Internal Medicine

## 2015-06-01 ENCOUNTER — Encounter: Payer: Medicare Other | Admitting: Internal Medicine

## 2015-06-07 ENCOUNTER — Other Ambulatory Visit: Payer: Medicare Other

## 2015-06-11 ENCOUNTER — Other Ambulatory Visit: Payer: Self-pay | Admitting: Infectious Diseases

## 2015-06-23 ENCOUNTER — Ambulatory Visit: Payer: Medicare Other | Admitting: Infectious Diseases

## 2015-06-28 ENCOUNTER — Other Ambulatory Visit: Payer: Self-pay | Admitting: Infectious Diseases

## 2015-06-28 ENCOUNTER — Other Ambulatory Visit: Payer: Self-pay | Admitting: Nurse Practitioner

## 2015-07-12 ENCOUNTER — Ambulatory Visit: Payer: Medicare Other | Admitting: Infectious Diseases

## 2015-07-17 ENCOUNTER — Emergency Department (INDEPENDENT_AMBULATORY_CARE_PROVIDER_SITE_OTHER)
Admission: EM | Admit: 2015-07-17 | Discharge: 2015-07-17 | Disposition: A | Discharge status: Home / Self Care | Payer: Medicare Other | Source: Home / Self Care | Attending: Family Medicine | Admitting: Family Medicine

## 2015-07-17 DIAGNOSIS — R519 Headache, unspecified: Secondary | ICD-10-CM

## 2015-07-17 DIAGNOSIS — J45901 Unspecified asthma with (acute) exacerbation: Secondary | ICD-10-CM

## 2015-07-17 DIAGNOSIS — J069 Acute upper respiratory infection, unspecified: Secondary | ICD-10-CM | POA: Diagnosis not present

## 2015-07-17 DIAGNOSIS — R05 Cough: Secondary | ICD-10-CM

## 2015-07-17 DIAGNOSIS — R059 Cough, unspecified: Secondary | ICD-10-CM

## 2015-07-17 DIAGNOSIS — R51 Headache: Secondary | ICD-10-CM | POA: Diagnosis not present

## 2015-07-17 MED ORDER — METHYLPREDNISOLONE SODIUM SUCC 125 MG IJ SOLR
INTRAMUSCULAR | Status: AC
  Filled 2015-07-17: qty 2

## 2015-07-17 MED ORDER — KETOROLAC TROMETHAMINE 30 MG/ML IJ SOLN
INTRAMUSCULAR | Status: AC
  Filled 2015-07-17: qty 1

## 2015-07-17 MED ORDER — METHYLPREDNISOLONE SODIUM SUCC 125 MG IJ SOLR
80.0000 mg | Freq: Once | INTRAMUSCULAR | Status: AC
  Administered 2015-07-17: 80 mg via INTRAMUSCULAR

## 2015-07-17 MED ORDER — HYDROCOD POLST-CPM POLST ER 10-8 MG/5ML PO SUER: 5.0000 mL | Freq: Two times a day (BID) | ORAL | Status: DC | PRN

## 2015-07-17 MED ORDER — KETOROLAC TROMETHAMINE 30 MG/ML IJ SOLN: 30.0000 mg | Freq: Once | INTRAMUSCULAR | Status: DC

## 2015-07-17 MED ORDER — KETOROLAC TROMETHAMINE 30 MG/ML IJ SOLN
30.0000 mg | Freq: Once | INTRAMUSCULAR | Status: AC
  Administered 2015-07-17: 30 mg via INTRAMUSCULAR

## 2015-07-17 NOTE — Discharge Instructions (Signed)
Asthma, Adult Asthma is a condition of the lungs in which the airways tighten and narrow. Asthma can make it hard to breathe. Asthma cannot be cured, but medicine and lifestyle changes can help control it. Asthma may be started (triggered) by:  Animal skin flakes (dander).  Dust.  Cockroaches.  Pollen.  Mold.  Smoke.  Cleaning products.  Hair sprays or aerosol sprays.  Paint fumes or strong smells.  Cold air, weather changes, and winds.  Crying or laughing hard.  Stress.  Certain medicines or drugs.  Foods, such as dried fruit, potato chips, and sparkling grape juice.  Infections or conditions (colds, flu).  Exercise.  Certain medical conditions or diseases.  Exercise or tiring activities. HOME CARE   Take medicine as told by your doctor.  Use a peak flow meter as told by your doctor. A peak flow meter is a tool that measures how well the lungs are working.  Record and keep track of the peak flow meter's readings.  Understand and use the asthma action plan. An asthma action plan is a written plan for taking care of your asthma and treating your attacks.  To help prevent asthma attacks:  Do not smoke. Stay away from secondhand smoke.  Change your heating and air conditioning filter often.  Limit your use of fireplaces and wood stoves.  Get rid of pests (such as roaches and mice) and their droppings.  Throw away plants if you see mold on them.  Clean your floors. Dust regularly. Use cleaning products that do not smell.  Have someone vacuum when you are not home. Use a vacuum cleaner with a HEPA filter if possible.  Replace carpet with wood, tile, or vinyl flooring. Carpet can trap animal skin flakes and dust.  Use allergy-proof pillows, mattress covers, and box spring covers.  Wash bed sheets and blankets every week in hot water and dry them in a dryer.  Use blankets that are made of polyester or cotton.  Clean bathrooms and kitchens with bleach.  If possible, have someone repaint the walls in these rooms with mold-resistant paint. Keep out of the rooms that are being cleaned and painted.  Wash hands often. GET HELP IF:  You have make a whistling sound when breaking (wheeze), have shortness of breath, or have a cough even if taking medicine to prevent attacks.  The colored mucus you cough up (sputum) is thicker than usual.  The colored mucus you cough up changes from clear or white to yellow, green, gray, or bloody.  You have problems from the medicine you are taking such as:  A rash.  Itching.  Swelling.  Trouble breathing.  You need reliever medicines more than 2-3 times a week.  Your peak flow measurement is still at 50-79% of your personal best after following the action plan for 1 hour.  You have a fever. GET HELP RIGHT AWAY IF:   You seem to be worse and are not responding to medicine during an asthma attack.  You are short of breath even at rest.  You get short of breath when doing very little activity.  You have trouble eating, drinking, or talking.  You have chest pain.  You have a fast heartbeat.  Your lips or fingernails start to turn blue.  You are light-headed, dizzy, or faint.  Your peak flow is less than 50% of your personal best.   This information is not intended to replace advice given to you by your health care provider. Make sure   you discuss any questions you have with your health care provider.   Document Released: 12/06/2007 Document Revised: 03/10/2015 Document Reviewed: 01/16/2013 [employer] Interactive Patient Education 2016 [employer]  

## 2015-07-17 NOTE — ED Provider Notes (Signed)
CSN: [id number]     Arrival date & time 07/17/15  1832 History   First MD Initiated Contact with Patient 07/17/15 1938     No chief complaint on file.  (Consider location/radiation/quality/duration/timing/severity/associated sxs/prior Treatment) Patient is a 55 y.o. male presenting with cough and headaches. The history is provided by the patient. No language interpreter was used.  Cough Cough characteristics:  Productive Sputum characteristics:  Yellow Severity:  Moderate Onset quality:  Gradual Duration:  4 days Timing:  Constant Progression:  Worsening Chronicity:  New Smoker: no   Context: not sick contacts and not smoke exposure   Relieved by:  Nothing Worsened by:  Environmental changes Associated symptoms: chills, fever, headaches, myalgias, sinus congestion and wheezing   Associated symptoms: no chest pain   Associated symptoms comment:  Chest tightness at times worsens his breathing. He uses albuterol and Atrovent which helps some. He had a temp of 101.2 at home. Patient and her sister mentioned he has had PNA multiple times and this is also typical of his asthma exacerbation. He feels better with steroid injection and he is requesting for one. Headache Pain location:  Frontal Severity currently:  8/10 Onset quality:  Gradual Duration:  3 days Timing:  Intermittent Progression:  Waxing and waning Context: not activity   Relieved by:  Nothing Worsened by:  Nothing Associated symptoms: cough, fever and myalgias   Associated symptoms: no abdominal pain, no blurred vision and no eye pain     Past Medical History  Diagnosis Date  . HIV (human immunodeficiency virus infection) ([place]) 1988  . Chronic anemia     normocytic  . Chronic folliculitis   . Gross hematuria   . Elevated PSA   . H/O pericarditis     2010--  myopercarditis--  resolved  . History of concussion     2012  &  2013  RESIDUAL HA'S --  RESOLVED  . Axillary lymphadenopathy   . History of MRSA infection  2010    infected boil  . History of gastric ulcer   . Lytic bone lesion of hip     WORK-UP DONE BY ONCOLOGIST DR HA --  NOT MALIGNANT  . Wears glasses   . Headache(784.0)     HX SEVERE FRONTAL HA'S  . Ulcer     hx of gastric  . Asthma     very rare  . Arthritis     r knee   . GERD (gastroesophageal reflux disease)   . Bronchitis   . Post concussion syndrome     resolved  . Prostate cancer ([place]) 04/25/13    gleason 3+3=6, volume 30 gm  . Environmental and seasonal allergies   . Pneumonia     hx of  . HCAP (healthcare-associated pneumonia) 11/19/2014  . Kidney stones   . Sinusitis, chronic 05/14/2015   Past Surgical History  Procedure Laterality Date  . Upper leg soft tissue biopsy Left 2012    lthigh  . Knee arthroscopy Right 1985  . Colonoscopy  12/26/2011    Procedure: COLONOSCOPY;  Surgeon: Lear Ng, MD;  Location: [facility];  Service: Endoscopy;  Laterality: N/A;  . Esophagogastroduodenoscopy  12/26/2011    Procedure: ESOPHAGOGASTRODUODENOSCOPY (EGD);  Surgeon: Lear Ng, MD;  Location: [facility];  Service: Endoscopy;  Laterality: N/A;  . Excisional bx left breast mass/  i  &  d left breast abscess  03-24-2009  . Excision chronic left breast abscess  09-21-2010  . Cystoscopy/retrograde/ureteroscopy Bilateral 04/25/2013  Procedure: CYSTOSCOPY/ BILATERAL RETROGRADES; BLADDER BIOPSIES;  Surgeon: Alexis Frock, MD;  Location: [facility];  Service: Urology;  Laterality: Bilateral;  . Prostate biopsy N/A 04/25/2013    Procedure: BIOPSY TRANSRECTAL ULTRASONIC PROSTATE (TUBP);  Surgeon: Alexis Frock, MD;  Location: [facility];  Service: Urology;  Laterality: N/A;  . Left hip biopsy  10/2012  . Left axilla biopsy  04/2013  . Robot assisted laparoscopic radical prostatectomy N/A 08/18/2013    Procedure: ROBOTIC ASSISTED LAPAROSCOPIC RADICAL PROSTATECTOMY;  Surgeon: Alexis Frock, MD;  Location: [facility];  Service:  Urology;  Laterality: N/A;  . Lymphadenectomy Bilateral 08/18/2013    Procedure: LYMPHADENECTOMY "PELVIC LYMPH NODE DISSECTION";  Surgeon: Alexis Frock, MD;  Location: [facility];  Service: Urology;  Laterality: Bilateral;  . Esophagogastroduodenoscopy (egd) with propofol N/A 07/29/2014    Procedure: ESOPHAGOGASTRODUODENOSCOPY (EGD) WITH PROPOFOL;  Surgeon: Lear Ng, MD;  Location: [place];  Service: Endoscopy;  Laterality: N/A;  . Colonoscopy with propofol N/A 07/29/2014    Procedure: COLONOSCOPY WITH PROPOFOL;  Surgeon: Lear Ng, MD;  Location: [place];  Service: Endoscopy;  Laterality: N/A;  . Cardiac catheterization  03-23-2009  DR COOPER    NORMAL CORONARY ARTERIES  . Dental examination under anesthesia    . Cholecystectomy N/A 11/06/2014    Procedure: LAPAROSCOPIC CHOLECYSTECTOMY WITH INTRAOPERATIVE CHOLANGIOGRAM;  Surgeon: Judeth Horn, MD;  Location: [facility];  Service: General;  Laterality: N/A;   Family History  Problem Relation Age of Onset  . Stroke Father   . Diabetes Father   . Cancer Father     brain cancer  . Hypertension Sister   . Cancer Maternal Uncle     prostate cancer  . Asthma Sister   . Asthma Father    Social History  Substance Use Topics  . Smoking status: Former Smoker -- 1.50 packs/day for 23 years    Types: Cigars, Cigarettes    Quit date: 05/22/2010  . Smokeless tobacco: Never Used  . Alcohol Use: No    Review of Systems  Constitutional: Positive for fever and chills.  Eyes: Negative for blurred vision and pain.  Respiratory: Positive for cough and wheezing.   Cardiovascular: Negative.  Negative for chest pain.  Gastrointestinal: Negative for abdominal pain.  Musculoskeletal: Positive for myalgias.  Neurological: Positive for headaches.    Allergies  Morphine and related; Penicillins; Sulfonamide derivatives; Tramadol; and Tylenol  Home Medications   Prior to Admission medications   Medication Sig Start Date End Date  Taking? Authorizing Provider  albuterol (PROVENTIL HFA;VENTOLIN HFA) 108 (90 BASE) MCG/ACT inhaler Inhale 2 puffs into the lungs every 4 (four) hours as needed for wheezing or shortness of breath. 11/11/14   Noemi Chapel, MD  albuterol (PROVENTIL) (2.5 MG/3ML) 0.083% nebulizer solution Take 3 mLs (2.5 mg total) by nebulization every 6 (six) hours as needed for wheezing or shortness of breath. 11/11/14   Noemi Chapel, MD  aspirin EC 81 MG tablet Take 81 mg by mouth 3 (three) times a week. Monday, Tuesday and Wednesday    Historical Provider, MD  chlorpheniramine-HYDROcodone (TUSSIONEX) 10-8 MG/5ML SUER Take 5 mLs by mouth every 12 (twelve) hours as needed for cough. 05/11/15   Michel Bickers, MD  diclofenac sodium (VOLTAREN) 1 % GEL APPLY TO THE AFFECTED AREA TWICE DAILY AS NEEDED 02/05/15   Campbell Riches, MD  ferrous sulfate 325 (65 FE) MG tablet Take 1 tablet (325 mg total) by mouth daily with breakfast. 11/21/14   Rockwood,  MD  gabapentin (NEURONTIN) 300 MG capsule Take 1 capsule (300 mg total) by mouth 4 (four) times daily as needed (nerve pain). 09/02/14   Dennie Bible, NP  hydrOXYzine (ATARAX/VISTARIL) 25 MG tablet TAKE 1 TABLET(25 MG) BY MOUTH EVERY 6 HOURS AS NEEDED FOR ANXIETY 04/16/15   Campbell Riches, MD  ipratropium (ATROVENT) 0.02 % nebulizer solution Take 2.5 mLs (0.5 mg total) by nebulization 3 (three) times daily as needed for wheezing or shortness of breath. Mix with xopenex 02/13/14   Carly J Rivet, MD  loratadine (CLARITIN) 10 MG tablet Take 10 mg by mouth daily.     Historical Provider, MD  mometasone (NASONEX) 50 MCG/ACT nasal spray Place 2 sprays into the nose daily. 03/09/15   Julianne Rice, MD  mometasone-formoterol [facility]) 200-5 MCG/ACT AERO Take 2 puffs first thing in am and then another 2 puffs about 12 hours later. 07/22/14   Tanda Rockers, MD  Multiple Vitamin (MULTIVITAMIN WITH MINERALS) TABS tablet Take 1 tablet by mouth at bedtime.    Historical Provider, MD   mupirocin ointment (BACTROBAN) 2 % APPLY TO THE AFFECTED AREA TWICE DAILY FOR 2 WEEKS. PLACE COTTON BETWEEN TOES TO ALLOW BETTER AERATION PRN 02/19/15   Campbell Riches, MD  NORVIR 100 MG TABS tablet Take 100 mg by mouth daily. 04/16/15   Historical Provider, MD  omeprazole (PRILOSEC) 40 MG capsule Take 40 mg by mouth at bedtime.    Historical Provider, MD  ondansetron (ZOFRAN) 4 MG tablet TAKE 1 TABLET BY MOUTH EVERY 8 HOURS AS NEEDED FOR NAUSEA OR VOMITING 07/01/15   Campbell Riches, MD  Polyvinyl Alcohol-Povidone (REFRESH OP) Place 1 drop into both eyes at bedtime.    Historical Provider, MD  PREZISTA 800 MG tablet TAKE 1 TABLET BY MOUTH EVERY DAY 03/11/15   Campbell Riches, MD  tizanidine (ZANAFLEX) 6 MG capsule Take 1 capsule (6 mg total) by mouth 3 (three) times daily as needed for muscle spasms. 01/17/15   Asencion Partridge Dohmeier, MD  tizanidine (ZANAFLEX) 6 MG capsule Take 1 capsule (6 mg total) by mouth 3 (three) times daily as needed for muscle spasms. 06/29/15   Dennie Bible, NP  traZODone (DESYREL) 50 MG tablet TAKE 1 TABLET BY MOUTH AT BEDTIME 03/11/15   Carly J Rivet, MD  TRUVADA 200-300 MG tablet Take 200 mg by mouth daily. 04/16/15   Historical Provider, MD  TRUVADA 200-300 MG tablet TAKE 1 TABLET BY MOUTH EVERY DAY WITH PREZISTA AND NORVIR 06/11/15   Campbell Riches, MD   Meds Ordered and Administered this Visit  Medications - No data to display  BP 152/89 mmHg  Pulse 93  Temp(Src) 98.8 F (37.1 C) (Oral)  Resp 18  SpO2 97% No data found.   Physical Exam  Constitutional: He is oriented to person, place, and time. He appears well-developed. No distress.  HENT:  Head: Normocephalic.  Right Ear: External ear normal.  Left Ear: External ear normal.  Mouth/Throat: Oropharynx is clear and moist. No oropharyngeal exudate.  Eyes: EOM are normal. Pupils are equal, round, and reactive to light. Right eye exhibits no discharge. Left eye exhibits no discharge.  Neck: Neck  supple.  Cardiovascular: Normal rate, regular rhythm, normal heart sounds and intact distal pulses.   No murmur heard. Pulmonary/Chest: Effort normal and breath sounds normal. No respiratory distress. He has no wheezes. He has no rales. He exhibits no tenderness.  Abdominal: Soft. Bowel sounds are normal. He exhibits no distension and  no mass. There is no tenderness. There is no guarding.  Musculoskeletal: He exhibits no edema.  Neurological: He is alert and oriented to person, place, and time. No cranial nerve deficit.  Nursing note and vitals reviewed.   ED Course  Procedures (including critical care time)  Labs Review Labs Reviewed - No data to display  Imaging Review No results found.   Visual Acuity Review  Right Eye Distance:   Left Eye Distance:   Bilateral Distance:    Right Eye Near:   Left Eye Near:    Bilateral Near:         MDM  No diagnosis found. Upper respiratory infection  Mild asthma exacerbation  Nonintractable headache, unspecified chronicity pattern, unspecified headache type  Cough - Plan: chlorpheniramine-HYDROcodone (TUSSIONEX) 10-8 MG/5ML SUER   Physical exam benign. O2 on RA was 97% which is reassuring. Not certain benefit of solumedrol at this time but patient insist on getting it since this makes his breathing better and relieves his chest tightness. 80 ml of IM solumedrol given today. Continue home albuterol prn as well as Dulera and Atrovent. Return precaution discussed.  I gave him i shot of Toradol 30 mg IM for headache. May use Tylenol as needed for headache and fever. Return precaution discussed.  I refilled his tussionex for cough. F/U as needed.    Kinnie Feil, MD 07/17/15 2047

## 2015-07-21 ENCOUNTER — Ambulatory Visit (INDEPENDENT_AMBULATORY_CARE_PROVIDER_SITE_OTHER): Payer: Medicare Other | Admitting: Infectious Diseases

## 2015-07-21 ENCOUNTER — Encounter: Payer: Self-pay | Admitting: Infectious Diseases

## 2015-07-21 VITALS — BP 137/92 | HR 94 | Temp 98.3°F | Wt 208.0 lb

## 2015-07-21 DIAGNOSIS — B20 Human immunodeficiency virus [HIV] disease: Secondary | ICD-10-CM

## 2015-07-21 DIAGNOSIS — Z79899 Other long term (current) drug therapy: Secondary | ICD-10-CM | POA: Diagnosis not present

## 2015-07-21 DIAGNOSIS — Z113 Encounter for screening for infections with a predominantly sexual mode of transmission: Secondary | ICD-10-CM

## 2015-07-21 DIAGNOSIS — R5383 Other fatigue: Secondary | ICD-10-CM | POA: Insufficient documentation

## 2015-07-21 DIAGNOSIS — R0789 Other chest pain: Secondary | ICD-10-CM | POA: Diagnosis not present

## 2015-07-21 DIAGNOSIS — R5382 Chronic fatigue, unspecified: Secondary | ICD-10-CM

## 2015-07-21 DIAGNOSIS — Z23 Encounter for immunization: Secondary | ICD-10-CM

## 2015-07-21 MED ORDER — EMTRICITABINE-TENOFOVIR DF 200-300 MG PO TABS: ORAL_TABLET | ORAL | Status: DC

## 2015-07-21 MED ORDER — CYANOCOBALAMIN 1000 MCG/ML IJ SOLN: 1000.0000 ug | Freq: Once | INTRAMUSCULAR | Status: DC

## 2015-07-21 MED ORDER — DARUNAVIR ETHANOLATE 800 MG PO TABS: 800.0000 mg | ORAL_TABLET | Freq: Every day | ORAL | Status: DC

## 2015-07-21 MED ORDER — NORVIR 100 MG PO TABS: 100.0000 mg | ORAL_TABLET | Freq: Every day | ORAL | Status: DC

## 2015-07-21 MED ORDER — RALTEGRAVIR POTASSIUM 400 MG PO TABS: 400.0000 mg | ORAL_TABLET | Freq: Two times a day (BID) | ORAL | Status: DC

## 2015-07-21 NOTE — Assessment & Plan Note (Signed)
He is doing well.  Offered to change him to DRV/genvoya but he is hesitant to do this.  Offered/refused condoms.  Will check his labs today.  rtc 3-4 months

## 2015-07-21 NOTE — Addendum Note (Signed)
Addended by: Andrei Mccook C on: 07/21/2015 05:10 PM   Modules accepted: Orders

## 2015-07-21 NOTE — Progress Notes (Signed)
   Subjective:    Patient ID: Casey Brennan, male    DOB: 22-Jul-1960, 55 y.o.   MRN: [id number]  HPI 55 yo M with hx HIV+ and prostate Ca. Currently taking ISN/TRV/DRVr.  Had radical prostatectomy 08-18-13, adenoCA, LN (-), margins (-).  Had R knee arthroscopy 1985, has been having pain recently. Has had MRi and has severe osteoarthritis.  Had cholecystectomy 11-06-14 and afterwards he had COPD exacerbation (COPD gold) adm 5-19 to 5-21.   Has been followed by H/O for anemia, improved with course of iron. He was also eval for increased light chains- this also has resolved.  He wsa seen recently for cough- it was suggested he try simpler regimen of genvoya-DRV.   HIV 1 RNA QUANT (copies/mL)  Date Value  05/11/2015 <20  12/02/2014 <20  11/19/2014 QNSRP   CD4 T CELL ABS (/uL)  Date Value  05/11/2015 360*  12/02/2014 340*  11/19/2014 370*   Still has cough. Has been taking mucinex, alleve, got steroid injection at Urgent Care. Ran out of tussionex. Fever has resolved. Headaches have improved. Sleep has been poor.    Review of Systems  Constitutional: Negative for fever and chills.  Respiratory: Positive for cough.   Gastrointestinal: Negative for diarrhea and constipation.  Genitourinary: Negative for difficulty urinating.  Psychiatric/Behavioral: Positive for sleep disturbance.       Objective:   Physical Exam  Constitutional: He appears well-developed and well-nourished.  HENT:  Mouth/Throat: No oropharyngeal exudate.  Eyes: EOM are normal. Pupils are equal, round, and reactive to light.  Neck: Neck supple.  Cardiovascular: Normal rate and regular rhythm.   Pulmonary/Chest: Effort normal and breath sounds normal.  Abdominal: Soft. Bowel sounds are normal. There is no tenderness. There is no rebound.  Lymphadenopathy:    He has no cervical adenopathy.      Assessment & Plan:

## 2015-07-21 NOTE — Assessment & Plan Note (Addendum)
Will get him gxt Will get him in with new PCP (wants male) Will check his TSH He also wants B12 shot today

## 2015-07-21 NOTE — Addendum Note (Signed)
Addended by: Clarnce Flock E on: 07/21/2015 04:57 PM   Modules accepted: Orders

## 2015-07-27 ENCOUNTER — Other Ambulatory Visit: Payer: Medicare Other

## 2015-07-27 ENCOUNTER — Other Ambulatory Visit (HOSPITAL_COMMUNITY)
Admission: RE | Admit: 2015-07-27 | Discharge: 2015-07-27 | Disposition: A | Discharge status: Home / Self Care | Payer: Medicare Other | Source: Ambulatory Visit | Attending: Infectious Diseases | Admitting: Infectious Diseases

## 2015-07-27 ENCOUNTER — Other Ambulatory Visit: Payer: Self-pay | Admitting: Infectious Diseases

## 2015-07-27 ENCOUNTER — Other Ambulatory Visit: Payer: Self-pay | Admitting: *Deleted

## 2015-07-27 DIAGNOSIS — Z113 Encounter for screening for infections with a predominantly sexual mode of transmission: Secondary | ICD-10-CM | POA: Insufficient documentation

## 2015-07-27 DIAGNOSIS — Z79899 Other long term (current) drug therapy: Secondary | ICD-10-CM | POA: Diagnosis not present

## 2015-07-27 DIAGNOSIS — B20 Human immunodeficiency virus [HIV] disease: Secondary | ICD-10-CM

## 2015-07-27 LAB — COMPLETE METABOLIC PANEL WITH GFR
ALT: 18 U/L (ref 9–46)
AST: 20 U/L (ref 10–35)
Albumin: 4.3 g/dL (ref 3.6–5.1)
Alkaline Phosphatase: 112 U/L (ref 40–115)
BUN: 10 mg/dL (ref 7–25)
CO2: 29 mmol/L (ref 20–31)
Calcium: 9.1 mg/dL (ref 8.6–10.3)
Chloride: 102 mmol/L (ref 98–110)
Creat: 1.26 mg/dL (ref 0.70–1.33)
GFR, Est African American: 75 mL/min (ref >=60)
GFR, Est Non African American: 65 mL/min (ref >=60)
Glucose, Bld: 118 mg/dL — ABNORMAL HIGH (ref 65–99)
Potassium: 4.1 mmol/L (ref 3.5–5.3)
Sodium: 137 mmol/L (ref 135–146)
Total Bilirubin: 0.5 mg/dL (ref 0.2–1.2)
Total Protein: 7.1 g/dL (ref 6.1–8.1)

## 2015-07-27 LAB — CBC
HCT: 42.6 % (ref 39.0–52.0)
Hemoglobin: 14.5 g/dL (ref 13.0–17.0)
MCH: 33.9 pg (ref 26.0–34.0)
MCHC: 34 g/dL (ref 30.0–36.0)
MCV: 99.5 fL (ref 78.0–100.0)
MPV: 11.5 fL (ref 8.6–12.4)
Platelets: 207 10*3/uL (ref 150–400)
RBC: 4.28 MIL/uL (ref 4.22–5.81)
RDW: 13.6 % (ref 11.5–15.5)
WBC: 6.2 10*3/uL (ref 4.0–10.5)

## 2015-07-27 LAB — LIPID PANEL
Cholesterol: 207 mg/dL — ABNORMAL HIGH (ref 125–200)
HDL: 57 mg/dL (ref >=40)
LDL Cholesterol: 122 mg/dL (ref <130)
Total CHOL/HDL Ratio: 3.6 Ratio (ref <=5.0)
Triglycerides: 141 mg/dL (ref <150)
VLDL: 28 mg/dL (ref <30)

## 2015-07-27 MED ORDER — ONDANSETRON HCL 4 MG PO TABS: ORAL_TABLET | ORAL | Status: DC

## 2015-07-28 DIAGNOSIS — Z79899 Other long term (current) drug therapy: Secondary | ICD-10-CM | POA: Diagnosis not present

## 2015-07-28 DIAGNOSIS — Z113 Encounter for screening for infections with a predominantly sexual mode of transmission: Secondary | ICD-10-CM | POA: Diagnosis not present

## 2015-07-28 DIAGNOSIS — B20 Human immunodeficiency virus [HIV] disease: Secondary | ICD-10-CM | POA: Diagnosis not present

## 2015-07-28 LAB — RPR

## 2015-07-29 LAB — URINE CYTOLOGY ANCILLARY ONLY
Chlamydia: NEGATIVE
Neisseria Gonorrhea: NEGATIVE

## 2015-07-29 LAB — T-HELPER CELL (CD4) - (RCID CLINIC ONLY)
CD4 % Helper T Cell: 22 % — ABNORMAL LOW (ref 33–55)
CD4 T Cell Abs: 450 /uL (ref 400–2700)

## 2015-07-29 LAB — HIV-1 RNA QUANT-NO REFLEX-BLD
HIV 1 RNA Quant: <20 copies/mL (ref <20)
HIV-1 RNA Quant, Log: <1.3 Log copies/mL (ref <1.30)

## 2015-08-03 ENCOUNTER — Encounter: Payer: Medicare Other | Admitting: Physician Assistant

## 2015-08-03 ENCOUNTER — Ambulatory Visit (INDEPENDENT_AMBULATORY_CARE_PROVIDER_SITE_OTHER): Payer: Medicare Other

## 2015-08-03 DIAGNOSIS — R0789 Other chest pain: Secondary | ICD-10-CM | POA: Diagnosis not present

## 2015-08-03 LAB — EXERCISE TOLERANCE TEST
Estimated workload: 10.1 METS
Exercise duration (min): 9 min
Exercise duration (sec): 0 s
MPHR: 168 {beats}/min
Peak HR: 155 {beats}/min
Percent HR: 92 %
RPE: 15
Rest HR: 81 {beats}/min

## 2015-09-02 ENCOUNTER — Ambulatory Visit: Payer: Medicare Other | Admitting: Nurse Practitioner

## 2015-09-04 ENCOUNTER — Encounter (HOSPITAL_COMMUNITY): Payer: Self-pay | Admitting: Emergency Medicine

## 2015-09-04 ENCOUNTER — Emergency Department (HOSPITAL_COMMUNITY): Payer: Medicare Other

## 2015-09-04 ENCOUNTER — Other Ambulatory Visit: Payer: Self-pay | Admitting: Infectious Diseases

## 2015-09-04 ENCOUNTER — Emergency Department (HOSPITAL_COMMUNITY)
Admission: EM | Admit: 2015-09-04 | Discharge: 2015-09-05 | Disposition: A | Discharge status: Home / Self Care | Payer: Medicare Other | Attending: Emergency Medicine | Admitting: Emergency Medicine

## 2015-09-04 DIAGNOSIS — R05 Cough: Secondary | ICD-10-CM | POA: Diagnosis not present

## 2015-09-04 DIAGNOSIS — Z7951 Long term (current) use of inhaled steroids: Secondary | ICD-10-CM | POA: Diagnosis not present

## 2015-09-04 DIAGNOSIS — Z862 Personal history of diseases of the blood and blood-forming organs and certain disorders involving the immune mechanism: Secondary | ICD-10-CM | POA: Insufficient documentation

## 2015-09-04 DIAGNOSIS — M1711 Unilateral primary osteoarthritis, right knee: Secondary | ICD-10-CM | POA: Diagnosis not present

## 2015-09-04 DIAGNOSIS — Z8701 Personal history of pneumonia (recurrent): Secondary | ICD-10-CM | POA: Insufficient documentation

## 2015-09-04 DIAGNOSIS — Z872 Personal history of diseases of the skin and subcutaneous tissue: Secondary | ICD-10-CM | POA: Insufficient documentation

## 2015-09-04 DIAGNOSIS — K219 Gastro-esophageal reflux disease without esophagitis: Secondary | ICD-10-CM | POA: Diagnosis not present

## 2015-09-04 DIAGNOSIS — Z79899 Other long term (current) drug therapy: Secondary | ICD-10-CM | POA: Insufficient documentation

## 2015-09-04 DIAGNOSIS — J45909 Unspecified asthma, uncomplicated: Secondary | ICD-10-CM | POA: Diagnosis not present

## 2015-09-04 DIAGNOSIS — B20 Human immunodeficiency virus [HIV] disease: Secondary | ICD-10-CM | POA: Diagnosis not present

## 2015-09-04 DIAGNOSIS — Z880 Allergy status to penicillin: Secondary | ICD-10-CM | POA: Diagnosis not present

## 2015-09-04 DIAGNOSIS — Z8546 Personal history of malignant neoplasm of prostate: Secondary | ICD-10-CM | POA: Diagnosis not present

## 2015-09-04 DIAGNOSIS — R509 Fever, unspecified: Secondary | ICD-10-CM | POA: Insufficient documentation

## 2015-09-04 DIAGNOSIS — R0981 Nasal congestion: Secondary | ICD-10-CM | POA: Diagnosis not present

## 2015-09-04 DIAGNOSIS — Z87828 Personal history of other (healed) physical injury and trauma: Secondary | ICD-10-CM | POA: Insufficient documentation

## 2015-09-04 DIAGNOSIS — Z7982 Long term (current) use of aspirin: Secondary | ICD-10-CM | POA: Diagnosis not present

## 2015-09-04 DIAGNOSIS — Z8614 Personal history of Methicillin resistant Staphylococcus aureus infection: Secondary | ICD-10-CM | POA: Diagnosis not present

## 2015-09-04 DIAGNOSIS — Z87891 Personal history of nicotine dependence: Secondary | ICD-10-CM | POA: Diagnosis not present

## 2015-09-04 DIAGNOSIS — R059 Cough, unspecified: Secondary | ICD-10-CM

## 2015-09-04 DIAGNOSIS — Z0389 Encounter for observation for other suspected diseases and conditions ruled out: Secondary | ICD-10-CM | POA: Diagnosis not present

## 2015-09-04 DIAGNOSIS — Z87442 Personal history of urinary calculi: Secondary | ICD-10-CM | POA: Diagnosis not present

## 2015-09-04 LAB — COMPREHENSIVE METABOLIC PANEL
ALT: 16 U/L — ABNORMAL LOW (ref 17–63)
AST: 22 U/L (ref 15–41)
Albumin: 4 g/dL (ref 3.5–5.0)
Alkaline Phosphatase: 93 U/L (ref 38–126)
Anion gap: 12 (ref 5–15)
BUN: 7 mg/dL (ref 6–20)
CO2: 22 mmol/L (ref 22–32)
Calcium: 8.8 mg/dL — ABNORMAL LOW (ref 8.9–10.3)
Chloride: 104 mmol/L (ref 101–111)
Creatinine, Ser: 1.28 mg/dL — ABNORMAL HIGH (ref 0.61–1.24)
GFR calc Af Amer: >60 mL/min (ref >60)
GFR calc non Af Amer: >60 mL/min (ref >60)
Glucose, Bld: 103 mg/dL — ABNORMAL HIGH (ref 65–99)
Potassium: 3.6 mmol/L (ref 3.5–5.1)
Sodium: 138 mmol/L (ref 135–145)
Total Bilirubin: 0.9 mg/dL (ref 0.3–1.2)
Total Protein: 6.7 g/dL (ref 6.5–8.1)

## 2015-09-04 LAB — URINALYSIS, ROUTINE W REFLEX MICROSCOPIC
Bilirubin Urine: NEGATIVE
Glucose, UA: NEGATIVE mg/dL
Hgb urine dipstick: NEGATIVE
Ketones, ur: NEGATIVE mg/dL
Leukocytes, UA: NEGATIVE
Nitrite: NEGATIVE
Protein, ur: NEGATIVE mg/dL
Specific Gravity, Urine: 1.008 (ref 1.005–1.030)
pH: 6.5 (ref 5.0–8.0)

## 2015-09-04 LAB — CBC WITH DIFFERENTIAL/PLATELET
Basophils Absolute: 0 10*3/uL (ref 0.0–0.1)
Basophils Relative: 1 %
Eosinophils Absolute: 0.1 10*3/uL (ref 0.0–0.7)
Eosinophils Relative: 1 %
HCT: 41 % (ref 39.0–52.0)
Hemoglobin: 13.6 g/dL (ref 13.0–17.0)
Lymphocytes Relative: 3 %
Lymphs Abs: 0.2 10*3/uL — ABNORMAL LOW (ref 0.7–4.0)
MCH: 32.5 pg (ref 26.0–34.0)
MCHC: 33.2 g/dL (ref 30.0–36.0)
MCV: 98.1 fL (ref 78.0–100.0)
Monocytes Absolute: 0.5 10*3/uL (ref 0.1–1.0)
Monocytes Relative: 8 %
Neutro Abs: 5.5 10*3/uL (ref 1.7–7.7)
Neutrophils Relative %: 87 %
Platelets: 123 10*3/uL — ABNORMAL LOW (ref 150–400)
RBC: 4.18 MIL/uL — ABNORMAL LOW (ref 4.22–5.81)
RDW: 13.8 % (ref 11.5–15.5)
WBC: 6.3 10*3/uL (ref 4.0–10.5)

## 2015-09-04 LAB — I-STAT CG4 LACTIC ACID, ED
Lactic Acid, Venous: 1.58 mmol/L (ref 0.5–2.0)
Lactic Acid, Venous: 0.99 mmol/L (ref 0.5–2.0)

## 2015-09-04 MED ORDER — DIPHENHYDRAMINE HCL 25 MG PO CAPS
25.0000 mg | ORAL_CAPSULE | Freq: Once | ORAL | Status: AC
  Administered 2015-09-04: 25 mg via ORAL
  Filled 2015-09-04: qty 1

## 2015-09-04 MED ORDER — IBUPROFEN 400 MG PO TABS
400.0000 mg | ORAL_TABLET | Freq: Once | ORAL | Status: AC
  Administered 2015-09-04: 400 mg via ORAL
  Filled 2015-09-04: qty 1

## 2015-09-04 MED ORDER — SODIUM CHLORIDE 0.9 % IV BOLUS (SEPSIS)
1000.0000 mL | Freq: Once | INTRAVENOUS | Status: AC
  Administered 2015-09-04: 1000 mL via INTRAVENOUS

## 2015-09-04 MED ORDER — LEVOFLOXACIN 750 MG PO TABS: 750.0000 mg | ORAL_TABLET | Freq: Every day | ORAL | Status: AC

## 2015-09-04 MED ORDER — BENZONATATE 100 MG PO CAPS
100.0000 mg | ORAL_CAPSULE | Freq: Once | ORAL | Status: AC
  Administered 2015-09-04: 100 mg via ORAL
  Filled 2015-09-04: qty 1

## 2015-09-04 MED ORDER — METOCLOPRAMIDE HCL 5 MG/ML IJ SOLN
10.0000 mg | Freq: Once | INTRAMUSCULAR | Status: AC
  Administered 2015-09-04: 10 mg via INTRAVENOUS
  Filled 2015-09-04: qty 2

## 2015-09-04 MED ORDER — ONDANSETRON HCL 4 MG/2ML IJ SOLN: 4.0000 mg | Freq: Once | INTRAMUSCULAR | Status: DC

## 2015-09-04 MED ORDER — IPRATROPIUM-ALBUTEROL 0.5-2.5 (3) MG/3ML IN SOLN
3.0000 mL | Freq: Once | RESPIRATORY_TRACT | Status: AC
  Administered 2015-09-04: 3 mL via RESPIRATORY_TRACT
  Filled 2015-09-04: qty 3

## 2015-09-04 NOTE — ED Notes (Signed)
MD at bedside. 

## 2015-09-04 NOTE — ED Provider Notes (Signed)
CSN: [id number]     Arrival date & time 09/04/15  1835 History   First MD Initiated Contact with Patient 09/04/15 2021     Chief Complaint  Patient presents with  . Fever   Patient is a 55 y.o. male presenting with fever. The history is provided by the patient and a relative.  Fever Temp source:  Subjective Severity:  Severe Onset quality:  Gradual Duration:  3 days Timing:  Constant Progression:  Worsening Chronicity:  New Relieved by:  Nothing Worsened by:  Nothing tried Ineffective treatments:  None tried Associated symptoms: chills, congestion and cough   Associated symptoms: no chest pain, no confusion, no diarrhea, no ear pain, no headaches, no nausea, no rash, no rhinorrhea, no sore throat and no vomiting   Risk factors: sick contacts     Past Medical History  Diagnosis Date  . HIV (human immunodeficiency virus infection) ([place]) 1988  . Chronic anemia     normocytic  . Chronic folliculitis   . Gross hematuria   . Elevated PSA   . H/O pericarditis     2010--  myopercarditis--  resolved  . History of concussion     2012  &  2013  RESIDUAL HA'S --  RESOLVED  . Axillary lymphadenopathy   . History of MRSA infection 2010    infected boil  . History of gastric ulcer   . Lytic bone lesion of hip     WORK-UP DONE BY ONCOLOGIST DR HA --  NOT MALIGNANT  . Wears glasses   . Headache(784.0)     HX SEVERE FRONTAL HA'S  . Ulcer     hx of gastric  . Asthma     very rare  . Arthritis     r knee   . GERD (gastroesophageal reflux disease)   . Bronchitis   . Post concussion syndrome     resolved  . Prostate cancer ([place]) 04/25/13    gleason 3+3=6, volume 30 gm  . Environmental and seasonal allergies   . Pneumonia     hx of  . HCAP (healthcare-associated pneumonia) 11/19/2014  . Kidney stones   . Sinusitis, chronic 05/14/2015   Past Surgical History  Procedure Laterality Date  . Upper leg soft tissue biopsy Left 2012    lthigh  . Knee arthroscopy Right 1985  .  Colonoscopy  12/26/2011    Procedure: COLONOSCOPY;  Surgeon: Lear Ng, MD;  Location: [facility];  Service: Endoscopy;  Laterality: N/A;  . Esophagogastroduodenoscopy  12/26/2011    Procedure: ESOPHAGOGASTRODUODENOSCOPY (EGD);  Surgeon: Lear Ng, MD;  Location: [facility];  Service: Endoscopy;  Laterality: N/A;  . Excisional bx left breast mass/  i  &  d left breast abscess  03-24-2009  . Excision chronic left breast abscess  09-21-2010  . Cystoscopy/retrograde/ureteroscopy Bilateral 04/25/2013    Procedure: CYSTOSCOPY/ BILATERAL RETROGRADES; BLADDER BIOPSIES;  Surgeon: Alexis Frock, MD;  Location: [facility];  Service: Urology;  Laterality: Bilateral;  . Prostate biopsy N/A 04/25/2013    Procedure: BIOPSY TRANSRECTAL ULTRASONIC PROSTATE (TUBP);  Surgeon: Alexis Frock, MD;  Location: [facility];  Service: Urology;  Laterality: N/A;  . Left hip biopsy  10/2012  . Left axilla biopsy  04/2013  . Robot assisted laparoscopic radical prostatectomy N/A 08/18/2013    Procedure: ROBOTIC ASSISTED LAPAROSCOPIC RADICAL PROSTATECTOMY;  Surgeon: Alexis Frock, MD;  Location: [facility];  Service: Urology;  Laterality: N/A;  . Lymphadenectomy Bilateral 08/18/2013    Procedure: LYMPHADENECTOMY "  PELVIC LYMPH NODE DISSECTION";  Surgeon: Alexis Frock, MD;  Location: [facility];  Service: Urology;  Laterality: Bilateral;  . Esophagogastroduodenoscopy (egd) with propofol N/A 07/29/2014    Procedure: ESOPHAGOGASTRODUODENOSCOPY (EGD) WITH PROPOFOL;  Surgeon: Lear Ng, MD;  Location: [place];  Service: Endoscopy;  Laterality: N/A;  . Colonoscopy with propofol N/A 07/29/2014    Procedure: COLONOSCOPY WITH PROPOFOL;  Surgeon: Lear Ng, MD;  Location: [place];  Service: Endoscopy;  Laterality: N/A;  . Cardiac catheterization  03-23-2009  DR COOPER    NORMAL CORONARY ARTERIES  . Dental examination under anesthesia    . Cholecystectomy N/A  11/06/2014    Procedure: LAPAROSCOPIC CHOLECYSTECTOMY WITH INTRAOPERATIVE CHOLANGIOGRAM;  Surgeon: Judeth Horn, MD;  Location: [facility];  Service: General;  Laterality: N/A;   Family History  Problem Relation Age of Onset  . Stroke Father   . Diabetes Father   . Cancer Father     brain cancer  . Hypertension Sister   . Cancer Maternal Uncle     prostate cancer  . Asthma Sister   . Asthma Father    Social History  Substance Use Topics  . Smoking status: Former Smoker -- 1.50 packs/day for 23 years    Types: Cigars, Cigarettes    Quit date: 05/22/2010  . Smokeless tobacco: Never Used  . Alcohol Use: No    Review of Systems  Constitutional: Positive for fever and chills. Negative for activity change and appetite change.  HENT: Positive for congestion. Negative for dental problem, ear pain, facial swelling, hearing loss, rhinorrhea, sneezing, sore throat, trouble swallowing and voice change.   Eyes: Negative for photophobia, pain, redness and visual disturbance.  Respiratory: Positive for cough. Negative for apnea, chest tightness, shortness of breath, wheezing and stridor.   Cardiovascular: Negative for chest pain, palpitations and leg swelling.  Gastrointestinal: Negative for nausea, vomiting, abdominal pain, diarrhea, constipation, blood in stool and abdominal distention.  Endocrine: Negative for polydipsia and polyuria.  Genitourinary: Negative for frequency, hematuria, flank pain, decreased urine volume and difficulty urinating.  Musculoskeletal: Negative for back pain, joint swelling, gait problem, neck pain and neck stiffness.  Skin: Negative for rash and wound.  Allergic/Immunologic: Negative for immunocompromised state.  Neurological: Negative for dizziness, syncope, facial asymmetry, speech difficulty, weakness, light-headedness, numbness and headaches.  Hematological: Negative for adenopathy.  Psychiatric/Behavioral: Negative for suicidal ideas, behavioral problems, confusion,  sleep disturbance and agitation. The patient is not nervous/anxious.   All other systems reviewed and are negative.     Allergies  Morphine and related; Penicillins; Sulfonamide derivatives; Tramadol; and Tylenol  Home Medications   Prior to Admission medications   Medication Sig Start Date End Date Taking? Authorizing Provider  albuterol (PROVENTIL HFA;VENTOLIN HFA) 108 (90 BASE) MCG/ACT inhaler Inhale 2 puffs into the lungs every 4 (four) hours as needed for wheezing or shortness of breath. 11/11/14  Yes Noemi Chapel, MD  albuterol (PROVENTIL) (2.5 MG/3ML) 0.083% nebulizer solution Take 3 mLs (2.5 mg total) by nebulization every 6 (six) hours as needed for wheezing or shortness of breath. Patient taking differently: Take 2.5 mg by nebulization 3 (three) times daily as needed for wheezing or shortness of breath. Mix with ipratropium 11/11/14  Yes Noemi Chapel, MD  aspirin EC 81 MG tablet Take 81 mg by mouth every Monday, Wednesday, and Friday. At bedtime   Yes Historical Provider, MD  Beclomethasone Dipropionate (QNASL) 80 MCG/ACT AERS Place 2 sprays into both nostrils daily.   Yes Historical Provider, MD  cyanocobalamin (,  VITAMIN B-12,) 1000 MCG/ML injection Inject 1 mL (1,000 mcg total) into the muscle once. Patient taking differently: Inject 1,000 mcg into the muscle every 30 (thirty) days. Last injection beginning of January 07/21/15  Yes Campbell Riches, MD  Darunavir Ethanolate (PREZISTA) 800 MG tablet Take 1 tablet (800 mg total) by mouth daily. Patient taking differently: Take 800 mg by mouth at bedtime.  07/21/15  Yes Campbell Riches, MD  diclofenac sodium (VOLTAREN) 1 % GEL APPLY TO THE AFFECTED AREA TWICE DAILY AS NEEDED Patient taking differently: Apply 1 application topically 2 (two) times daily as needed (pain).  02/05/15  Yes Campbell Riches, MD  diphenhydrAMINE (BENADRYL) 25 MG tablet Take 25 mg by mouth every 6 (six) hours as needed for allergies (congestion).   Yes  Historical Provider, MD  emtricitabine-tenofovir (TRUVADA) 200-300 MG tablet TAKE 1 TABLET BY MOUTH EVERY DAY WITH PREZISTA AND NORVIR Patient taking differently: Take 1 tablet by mouth at bedtime. TAKE  WITH PREZISTA AND NORVIR 07/21/15  Yes Campbell Riches, MD  fexofenadine-pseudoephedrine (ALLEGRA-D) 60-120 MG 12 hr tablet Take 1 tablet by mouth at bedtime.   Yes Historical Provider, MD  gabapentin (NEURONTIN) 300 MG capsule Take 1 capsule (300 mg total) by mouth 4 (four) times daily as needed (nerve pain). 09/02/14  Yes Dennie Bible, NP  guaiFENesin (MUCINEX) 600 MG 12 hr tablet Take 600 mg by mouth 2 (two) times daily as needed for cough or to loosen phlegm.   Yes Historical Provider, MD  hydrOXYzine (ATARAX/VISTARIL) 25 MG tablet TAKE 1 TABLET(25 MG) BY MOUTH EVERY 6 HOURS AS NEEDED FOR ANXIETY Patient taking differently: TAKE 1 TABLET(25 MG) BY MOUTH EVERY 6 HOURS AS NEEDED FOR ITCHING 04/16/15  Yes Campbell Riches, MD  ipratropium (ATROVENT) 0.02 % nebulizer solution Take 2.5 mLs (0.5 mg total) by nebulization 3 (three) times daily as needed for wheezing or shortness of breath. Mix with xopenex Patient taking differently: Take 0.5 mg by nebulization 3 (three) times daily as needed for wheezing or shortness of breath. Mix with albuterol 02/13/14  Yes Carly Montey Hora, MD  mometasone-formoterol (DULERA) 200-5 MCG/ACT AERO Take 2 puffs first thing in am and then another 2 puffs about 12 hours later. Patient taking differently: Inhale 2 puffs into the lungs every 12 (twelve) hours.  07/22/14  Yes Tanda Rockers, MD  Multiple Vitamin (MULTIVITAMIN WITH MINERALS) TABS tablet Take 1 tablet by mouth at bedtime.   Yes Historical Provider, MD  mupirocin ointment (BACTROBAN) 2 % APPLY TO THE AFFECTED AREA TWICE DAILY FOR 2 WEEKS, PLACE COTTON BETWEEN TOES TO ALLOW BETTER AERATION AS NEEDED 07/28/15  Yes Campbell Riches, MD  NORVIR 100 MG TABS tablet Take 1 tablet (100 mg total) by mouth  daily. Patient taking differently: Take 100 mg by mouth at bedtime.  07/21/15  Yes Campbell Riches, MD  omeprazole (PRILOSEC) 20 MG capsule Take 20 mg by mouth at bedtime. 07/27/15  Yes Historical Provider, MD  ondansetron (ZOFRAN) 4 MG tablet TAKE 1 TABLET BY MOUTH EVERY 8 HOURS AS NEEDED FOR NAUSEA OR VOMITING Patient taking differently: Take 4 mg by mouth every 8 (eight) hours as needed for nausea or vomiting.  07/27/15  Yes Campbell Riches, MD  Polyvinyl Alcohol-Povidone (REFRESH OP) Place 1 drop into both eyes daily.    Yes Historical Provider, MD  raltegravir (ISENTRESS) 400 MG tablet Take 1 tablet (400 mg total) by mouth 2 (two) times daily. Patient taking differently: Take 800  mg by mouth at bedtime.  07/21/15  Yes Campbell Riches, MD  tizanidine (ZANAFLEX) 6 MG capsule Take 1 capsule (6 mg total) by mouth 3 (three) times daily as needed for muscle spasms. 06/29/15  Yes Dennie Bible, NP  traZODone (DESYREL) 100 MG tablet Take 100 mg by mouth at bedtime as needed for sleep.   Yes Historical Provider, MD  chlorpheniramine-HYDROcodone (TUSSIONEX) 10-8 MG/5ML SUER Take 5 mLs by mouth every 12 (twelve) hours as needed for cough. 09/05/15   Vira Blanco, MD  ferrous sulfate 325 (65 FE) MG tablet Take 1 tablet (325 mg total) by mouth daily with breakfast. Patient not taking: Reported on 09/04/2015 11/21/14   Langley Gauss Moding, MD  levofloxacin (LEVAQUIN) 750 MG tablet Take 1 tablet (750 mg total) by mouth daily. 09/04/15 09/09/15  Vira Blanco, MD  mometasone (NASONEX) 50 MCG/ACT nasal spray Place 2 sprays into the nose daily. Patient not taking: Reported on 09/04/2015 03/09/15   Julianne Rice, MD  tizanidine (ZANAFLEX) 6 MG capsule Take 1 capsule (6 mg total) by mouth 3 (three) times daily as needed for muscle spasms. Patient not taking: Reported on 09/04/2015 01/17/15   Larey Seat, MD  traZODone (DESYREL) 50 MG tablet TAKE 1 TABLET BY MOUTH AT BEDTIME Patient not taking: Reported on 09/04/2015  03/11/15   Carly J Rivet, MD   BP 140/91 mmHg  Pulse 90  Temp(Src) 99 F (37.2 C) (Oral)  Resp 14  Ht 5\' 9"  (1.753 m)  Wt 92.534 kg  BMI 30.11 kg/m2  SpO2 95% Physical Exam  Constitutional: He is oriented to person, place, and time. He appears well-developed and well-nourished. No distress.  HENT:  Head: Normocephalic and atraumatic.  Right Ear: External ear normal.  Left Ear: External ear normal.  Eyes: Pupils are equal, round, and reactive to light. Right eye exhibits no discharge. Left eye exhibits no discharge.  Neck: Normal range of motion. No JVD present. No tracheal deviation present.  Cardiovascular: Normal rate, regular rhythm and normal heart sounds.  Exam reveals no friction rub.   No murmur heard. Pulmonary/Chest: Effort normal and breath sounds normal. No stridor. No respiratory distress. He has no wheezes.  Abdominal: Soft. Bowel sounds are normal. He exhibits no distension. There is no rebound and no guarding.  Musculoskeletal: Normal range of motion. He exhibits no edema or tenderness.  Lymphadenopathy:    He has no cervical adenopathy.  Neurological: He is alert and oriented to person, place, and time. No cranial nerve deficit. Coordination normal.  Skin: Skin is warm and dry. No rash noted. No pallor.  Psychiatric: He has a normal mood and affect. His behavior is normal. Judgment and thought content normal.  Nursing note and vitals reviewed.   ED Course  Procedures (including critical care time) Labs Review Labs Reviewed  COMPREHENSIVE METABOLIC PANEL - Abnormal; Notable for the following:    Glucose, Bld 103 (*)    Creatinine, Ser 1.28 (*)    Calcium 8.8 (*)    ALT 16 (*)    All other components within normal limits  CBC WITH DIFFERENTIAL/PLATELET - Abnormal; Notable for the following:    RBC 4.18 (*)    Platelets 123 (*)    Lymphs Abs 0.2 (*)    All other components within normal limits  CULTURE, BLOOD (ROUTINE X 2)  CULTURE, BLOOD (ROUTINE X 2)   URINE CULTURE  URINALYSIS, ROUTINE W REFLEX MICROSCOPIC (NOT AT [facility]  I-STAT CG4 LACTIC ACID, ED  I-STAT CG4  LACTIC ACID, ED    Imaging Review Dg Chest 1 View  09/04/2015  CLINICAL DATA:  Repeat examination performed with nipple markers. EXAM: CHEST 1 VIEW COMPARISON:  09/04/2015 FINDINGS: Repeat exam performed with nipple markers confirm nipple artifact. Heart size is normal. No pleural effusion or edema. No airspace consolidation identified. The visualized osseous structures appear intact. IMPRESSION: 1. No acute cardiopulmonary abnormalities. Electronically Signed   By: Kerby Moors M.D.   On: 09/04/2015 21:22   Dg Chest 2 View  09/04/2015  CLINICAL DATA:  Fever and chills beginning today. Congestion and cough for 3 days. Asthma. Pericarditis. EXAM: CHEST  2 VIEW COMPARISON:  03/09/2015 FINDINGS: The heart size and mediastinal contours are within normal limits. No evidence of pulmonary infiltrate or edema. No evidence of pneumothorax or pleural effusion. Nodular densities are seen overlying both lung bases on the frontal projection, suspicious for nipple shadows. IMPRESSION: No acute findings. Bilateral lower lung nodular densities on frontal projections, suspicious for nipple shadows. Recommend followup PA chest radiograph with nipple markers. Electronically Signed   By: Earle Gell M.D.   On: 09/04/2015 19:55   I have personally reviewed and evaluated these images and lab results as part of my medical decision-making.   EKG Interpretation None      MDM   Final diagnoses:  Fever, unspecified fever cause  Cough    Patient with history of HIV, last viral load undetectable, last CD4 count greater than 400% for evaluation of 3 days of fevers, chills. Patient had laboratory studies done in triage. Chest x-ray with no acute findings. White blood cell count normal. CMP at baseline. Lactate 1.58.  Patient with reported productive cough. Symptoms consistent with influenza. Do not suspect  bacterial pneumonia, and urinary tract infection, bacteremia.  Patient's heart rate improved with ibuprofen. Patient given Reglan, Benadryl, normal saline bolus for headache.  I will not check for flu last patient declines Tamiflu as he has had in the past and does not like it.  We'll treat patient with Levaquin at home per patient request as patient has developed superimposed bacterial pneumonia after viral infections in the past. He could potentially have an atypical pneumonia as well.  Patient discharged with Tussionex, Levaquin. Patient are to follow-up with primary care physician if symptoms continue past 4-5 days.  Discussed case my attending, Dr. Rogene Houston.      Vira Blanco, MD 09/05/15 [phone]

## 2015-09-04 NOTE — ED Notes (Signed)
Congestion and cough starting Wednesday; feeling worse everyday. Fever and chills started today, temp 103.2 at home. Took Mucinex and Benadryl.

## 2015-09-04 NOTE — ED Notes (Signed)
Pt's family calling out for pain meds, RN roused patient who states he hurts "all over" then falls back asleep. MD notified. No new orders.

## 2015-09-04 NOTE — Discharge Instructions (Signed)

## 2015-09-05 MED ORDER — HYDROCOD POLST-CPM POLST ER 10-8 MG/5ML PO SUER: 5.0000 mL | Freq: Two times a day (BID) | ORAL | Status: DC | PRN

## 2015-09-05 NOTE — ED Notes (Signed)
The pt falls asleep  When asked what his pain scale is

## 2015-09-05 NOTE — ED Provider Notes (Signed)
I saw and evaluated the patient, reviewed the resident's note and I agree with the findings and plan.   EKG Interpretation None       Results for orders placed or performed during the hospital encounter of 09/04/15  Comprehensive metabolic panel  Result Value Ref Range   Sodium 138 135 - 145 mmol/L   Potassium 3.6 3.5 - 5.1 mmol/L   Chloride 104 101 - 111 mmol/L   CO2 22 22 - 32 mmol/L   Glucose, Bld 103 (H) 65 - 99 mg/dL   BUN 7 6 - 20 mg/dL   Creatinine, Ser 1.28 (H) 0.61 - 1.24 mg/dL   Calcium 8.8 (L) 8.9 - 10.3 mg/dL   Total Protein 6.7 6.5 - 8.1 g/dL   Albumin 4.0 3.5 - 5.0 g/dL   AST 22 15 - 41 U/L   ALT 16 (L) 17 - 63 U/L   Alkaline Phosphatase 93 38 - 126 U/L   Total Bilirubin 0.9 0.3 - 1.2 mg/dL   GFR calc non Af Amer >60 >60 mL/min   GFR calc Af Amer >60 >60 mL/min   Anion gap 12 5 - 15  CBC with Differential  Result Value Ref Range   WBC 6.3 4.0 - 10.5 K/uL   RBC 4.18 (L) 4.22 - 5.81 MIL/uL   Hemoglobin 13.6 13.0 - 17.0 g/dL   HCT 41.0 39.0 - 52.0 %   MCV 98.1 78.0 - 100.0 fL   MCH 32.5 26.0 - 34.0 pg   MCHC 33.2 30.0 - 36.0 g/dL   RDW 13.8 11.5 - 15.5 %   Platelets 123 (L) 150 - 400 K/uL   Neutrophils Relative % 87 %   Neutro Abs 5.5 1.7 - 7.7 K/uL   Lymphocytes Relative 3 %   Lymphs Abs 0.2 (L) 0.7 - 4.0 K/uL   Monocytes Relative 8 %   Monocytes Absolute 0.5 0.1 - 1.0 K/uL   Eosinophils Relative 1 %   Eosinophils Absolute 0.1 0.0 - 0.7 K/uL   Basophils Relative 1 %   Basophils Absolute 0.0 0.0 - 0.1 K/uL  Urinalysis, Routine w reflex microscopic (not at [facility])  Result Value Ref Range   Color, Urine YELLOW YELLOW   APPearance CLEAR CLEAR   Specific Gravity, Urine 1.008 1.005 - 1.030   pH 6.5 5.0 - 8.0   Glucose, UA NEGATIVE NEGATIVE mg/dL   Hgb urine dipstick NEGATIVE NEGATIVE   Bilirubin Urine NEGATIVE NEGATIVE   Ketones, ur NEGATIVE NEGATIVE mg/dL   Protein, ur NEGATIVE NEGATIVE mg/dL   Nitrite NEGATIVE NEGATIVE   Leukocytes, UA NEGATIVE  NEGATIVE  I-Stat CG4 Lactic Acid, ED (Not at [facility])  Result Value Ref Range   Lactic Acid, Venous 1.58 0.5 - 2.0 mmol/L  I-Stat CG4 Lactic Acid, ED (Not at [facility])  Result Value Ref Range   Lactic Acid, Venous 0.99 0.5 - 2.0 mmol/L   Dg Chest 1 View  09/04/2015  CLINICAL DATA:  Repeat examination performed with nipple markers. EXAM: CHEST 1 VIEW COMPARISON:  09/04/2015 FINDINGS: Repeat exam performed with nipple markers confirm nipple artifact. Heart size is normal. No pleural effusion or edema. No airspace consolidation identified. The visualized osseous structures appear intact. IMPRESSION: 1. No acute cardiopulmonary abnormalities. Electronically Signed   By: Kerby Moors M.D.   On: 09/04/2015 21:22   Dg Chest 2 View  09/04/2015  CLINICAL DATA:  Fever and chills beginning today. Congestion and cough for 3 days. Asthma. Pericarditis. EXAM: CHEST  2 VIEW COMPARISON:  03/09/2015  FINDINGS: The heart size and mediastinal contours are within normal limits. No evidence of pulmonary infiltrate or edema. No evidence of pneumothorax or pleural effusion. Nodular densities are seen overlying both lung bases on the frontal projection, suspicious for nipple shadows. IMPRESSION: No acute findings. Bilateral lower lung nodular densities on frontal projections, suspicious for nipple shadows. Recommend followup PA chest radiograph with nipple markers. Electronically Signed   By: Earle Gell M.D.   On: 09/04/2015 19:55    Patient seen by me. Patient has a history of HIV. Patient on antivirals.  Patient symptoms seem to be consistent with the influenza. Chest x-rays negative for pneumonia. No significant leukocytosis or neutropenia. Patient was offered Tamiflu but does not like the way it makes him feel. Patient will be started on Levaquin and even though there is no signs of pneumonia patient is very concerned that sometimes it sets in so we will start him on that. He will follow-up with his doctors or return for any  new or worse symptoms.    Fredia Sorrow, MD 09/05/15 [phone]

## 2015-09-05 NOTE — ED Notes (Signed)
Discharged but waiting on rx

## 2015-09-06 LAB — URINE CULTURE: Culture: 5000

## 2015-09-07 ENCOUNTER — Encounter: Payer: Self-pay | Admitting: Nurse Practitioner

## 2015-09-07 ENCOUNTER — Ambulatory Visit (INDEPENDENT_AMBULATORY_CARE_PROVIDER_SITE_OTHER): Payer: Medicare Other | Admitting: Nurse Practitioner

## 2015-09-07 VITALS — BP 128/74 | HR 76 | Resp 14 | Ht 69.0 in | Wt 208.2 lb

## 2015-09-07 DIAGNOSIS — G609 Hereditary and idiopathic neuropathy, unspecified: Secondary | ICD-10-CM

## 2015-09-07 DIAGNOSIS — R519 Headache, unspecified: Secondary | ICD-10-CM

## 2015-09-07 DIAGNOSIS — Z21 Asymptomatic human immunodeficiency virus [HIV] infection status: Secondary | ICD-10-CM

## 2015-09-07 DIAGNOSIS — R51 Headache: Secondary | ICD-10-CM

## 2015-09-07 DIAGNOSIS — B20 Human immunodeficiency virus [HIV] disease: Secondary | ICD-10-CM

## 2015-09-07 MED ORDER — TIZANIDINE HCL 6 MG PO CAPS: 6.0000 mg | ORAL_CAPSULE | Freq: Three times a day (TID) | ORAL | Status: DC | PRN

## 2015-09-07 MED ORDER — GABAPENTIN 300 MG PO CAPS: 300.0000 mg | ORAL_CAPSULE | Freq: Four times a day (QID) | ORAL | Status: DC | PRN

## 2015-09-07 NOTE — Progress Notes (Signed)
I have read the note, and I agree with the clinical assessment and plan.  Shayley Medlin KEITH   
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
## 2015-09-07 NOTE — Patient Instructions (Signed)
Take gabapentin as directed this will help your neuropathy and your headaches Zanaflex when necessary, this will help your neck stiffness Follow-up yearly and when necessary, next with [place]

## 2015-09-09 LAB — CULTURE, BLOOD (ROUTINE X 2): Culture: NO GROWTH

## 2015-09-10 LAB — CULTURE, BLOOD (ROUTINE X 2): Culture: NO GROWTH

## 2015-09-15 ENCOUNTER — Encounter: Payer: Self-pay | Admitting: Internal Medicine

## 2015-09-15 ENCOUNTER — Ambulatory Visit (INDEPENDENT_AMBULATORY_CARE_PROVIDER_SITE_OTHER): Payer: Medicare Other | Admitting: Internal Medicine

## 2015-09-15 VITALS — BP 135/87 | HR 92 | Temp 98.7°F | Wt 209.0 lb

## 2015-09-15 DIAGNOSIS — B20 Human immunodeficiency virus [HIV] disease: Secondary | ICD-10-CM | POA: Diagnosis not present

## 2015-09-15 DIAGNOSIS — R059 Cough, unspecified: Secondary | ICD-10-CM

## 2015-09-15 DIAGNOSIS — R05 Cough: Secondary | ICD-10-CM

## 2015-09-15 MED ORDER — EMTRICITABINE-TENOFOVIR AF 200-25 MG PO TABS: 1.0000 | ORAL_TABLET | Freq: Every day | ORAL | Status: DC

## 2015-09-15 MED ORDER — HYDROCOD POLST-CPM POLST ER 10-8 MG/5ML PO SUER: 5.0000 mL | Freq: Two times a day (BID) | ORAL | Status: DC | PRN

## 2015-09-15 NOTE — Progress Notes (Signed)
Patient ID: Casey Brennan, male   DOB: 01-28-1961, 55 y.o.   MRN: [id number]       Patient ID: Casey Brennan, male   DOB: Feb 18, 1961, 55 y.o.   MRN: [id number]  HPI 54yo M with HIV disease, COPD, CD 4 count of 450/VL<20 on RLG takes 2 pils at once)- truvada-DRVr.  He developed respiratory infection and went to the ED on 3/4 for evaluation. He was given a course of levofloxacin plus tussionex for which he states that he feels like he is recovering from his respiratory illness.  Outpatient Encounter Prescriptions as of 09/15/2015  Medication Sig  . albuterol (PROVENTIL HFA;VENTOLIN HFA) 108 (90 BASE) MCG/ACT inhaler Inhale 2 puffs into the lungs every 4 (four) hours as needed for wheezing or shortness of breath.  [name] albuterol (PROVENTIL) (2.5 MG/3ML) 0.083% nebulizer solution Take 3 mLs (2.5 mg total) by nebulization every 6 (six) hours as needed for wheezing or shortness of breath. (Patient taking differently: Take 2.5 mg by nebulization 3 (three) times daily as needed for wheezing or shortness of breath. Mix with ipratropium)  . aspirin EC 81 MG tablet Take 81 mg by mouth every Monday, Wednesday, and Friday. At bedtime  . Beclomethasone Dipropionate (QNASL) 80 MCG/ACT AERS Place 2 sprays into both nostrils daily.  . chlorpheniramine-HYDROcodone (TUSSIONEX) 10-8 MG/5ML SUER Take 5 mLs by mouth every 12 (twelve) hours as needed for cough.  . cyanocobalamin (,VITAMIN B-12,) 1000 MCG/ML injection Inject 1 mL (1,000 mcg total) into the muscle once. (Patient taking differently: Inject 1,000 mcg into the muscle every 30 (thirty) days. Last injection beginning of January)  . Darunavir Ethanolate (PREZISTA) 800 MG tablet Take 1 tablet (800 mg total) by mouth daily. (Patient taking differently: Take 800 mg by mouth at bedtime. )  . diclofenac sodium (VOLTAREN) 1 % GEL APPLY TO THE AFFECTED AREA TWICE DAILY AS NEEDED (Patient taking differently: Apply 1 application topically 2 (two) times daily as needed  (pain). )  . diphenhydrAMINE (BENADRYL) 25 MG tablet Take 25 mg by mouth every 6 (six) hours as needed for allergies (congestion).  [name] emtricitabine-tenofovir (TRUVADA) 200-300 MG tablet TAKE 1 TABLET BY MOUTH EVERY DAY WITH PREZISTA AND NORVIR (Patient taking differently: Take 1 tablet by mouth at bedtime. TAKE  WITH PREZISTA AND NORVIR)  . ferrous sulfate 325 (65 FE) MG tablet Take 1 tablet (325 mg total) by mouth daily with breakfast.  . fexofenadine-pseudoephedrine (ALLEGRA-D) 60-120 MG 12 hr tablet Take 1 tablet by mouth at bedtime.  . gabapentin (NEURONTIN) 300 MG capsule Take 1 capsule (300 mg total) by mouth 4 (four) times daily as needed (nerve pain).  [name] guaiFENesin (MUCINEX) 600 MG 12 hr tablet Take 600 mg by mouth 2 (two) times daily as needed for cough or to loosen phlegm.  . hydrOXYzine (ATARAX/VISTARIL) 25 MG tablet TAKE 1 TABLET(25 MG) BY MOUTH EVERY 6 HOURS AS NEEDED FOR ANXIETY (Patient taking differently: TAKE 1 TABLET(25 MG) BY MOUTH EVERY 6 HOURS AS NEEDED FOR ITCHING)  . ipratropium (ATROVENT) 0.02 % nebulizer solution Take 2.5 mLs (0.5 mg total) by nebulization 3 (three) times daily as needed for wheezing or shortness of breath. Mix with xopenex (Patient taking differently: Take 0.5 mg by nebulization 3 (three) times daily as needed for wheezing or shortness of breath. Mix with albuterol)  . mometasone (NASONEX) 50 MCG/ACT nasal spray Place 2 sprays into the nose daily.  . mometasone-formoterol (DULERA) 200-5 MCG/ACT AERO Take 2 puffs first thing in am and then  another 2 puffs about 12 hours later. (Patient taking differently: Inhale 2 puffs into the lungs every 12 (twelve) hours. )  . Multiple Vitamin (MULTIVITAMIN WITH MINERALS) TABS tablet Take 1 tablet by mouth at bedtime.  . mupirocin ointment (BACTROBAN) 2 % APPLY TO THE AFFECTED AREA TWICE DAILY FOR 2 WEEKS, PLACE COTTON BETWEEN TOES TO ALLOW BETTER AERATION AS NEEDED  . NORVIR 100 MG TABS tablet Take 1 tablet (100 mg total)  by mouth daily. (Patient taking differently: Take 100 mg by mouth at bedtime. )  . omeprazole (PRILOSEC) 20 MG capsule Take 20 mg by mouth at bedtime.  . ondansetron (ZOFRAN) 4 MG tablet TAKE 1 TABLET BY MOUTH EVERY 8 HOURS AS NEEDED FOR NAUSEA OR VOMITING (Patient taking differently: Take 4 mg by mouth every 8 (eight) hours as needed for nausea or vomiting. )  . Polyvinyl Alcohol-Povidone (REFRESH OP) Place 1 drop into both eyes daily.   . raltegravir (ISENTRESS) 400 MG tablet Take 1 tablet (400 mg total) by mouth 2 (two) times daily. (Patient taking differently: Take 800 mg by mouth at bedtime. )  . tizanidine (ZANAFLEX) 6 MG capsule Take 1 capsule (6 mg total) by mouth 3 (three) times daily as needed for muscle spasms.  . traZODone (DESYREL) 100 MG tablet Take 100 mg by mouth at bedtime as needed for sleep.   No facility-administered encounter medications on file as of 09/15/2015.     Patient Active Problem List   Diagnosis Date Noted  . Fatigue 07/21/2015  . Sinusitis, chronic 05/14/2015  . Elevated serum immunoglobulin free light chains 05/14/2015  . Unexplained night sweats 01/21/2015  . Cough 01/18/2015  . Hypochromic microcytic anemia 12/16/2014  . Allergic rhinitis 10/24/2014  . Hereditary and idiopathic peripheral neuropathy 09/02/2014  . GERD (gastroesophageal reflux disease) 07/29/2014  . COPD GOLD III 07/22/2014  . Recurrent knee pain 04/27/2014  . Hx of primary hypertension 04/27/2014  . Atypical chest pain 02/13/2014  . Malignant neoplasm of prostate ([place]) 07/15/2013  . Elevated prostate specific antigen (PSA) 07/15/2013  . Iron deficiency anemia   . H/O prostate cancer   . Lytic bone lesion of hip 08/22/2012  . Human immunodeficiency virus (HIV) infection ([place]) 05/15/2012  . Cataract, nuclear 05/15/2012  . Asthma 01/23/2012  . Insomnia 05/11/2011  . Headache 08/24/2010  . Mood disorder in conditions classified elsewhere 06/14/2009  . Human immunodeficiency virus  (HIV) disease ([place]) 04/19/2009     There are no preventive care reminders to display for this patient.   Review of Systems + cough, congestion. No fevers, chills, shortness of breath. 10 point ros otherwise is negative Physical Exam   BP 135/87 mmHg  Pulse 92  Temp(Src) 98.7 F (37.1 C) (Oral)  Wt 209 lb (94.802 kg) Physical Exam  Constitutional: He is oriented to person, place, and time. He appears well-developed and well-nourished. No distress.  HENT:  Mouth/Throat: Oropharynx is clear and moist. No oropharyngeal exudate.  Cardiovascular: Normal rate, regular rhythm and normal heart sounds. Exam reveals no gallop and no friction rub.  No murmur heard.  Pulmonary/Chest: Effort normal and breath sounds normal. No respiratory distress. He has no wheezes.  Abdominal: Soft. Bowel sounds are normal. He exhibits no distension. There is no tenderness.  Lymphadenopathy:  He has no cervical adenopathy.  Neurological: He is alert and oriented to person, place, and time.  Skin: Skin is warm and dry. No rash noted. No erythema.  Psychiatric: He has a normal mood and affect. His behavior  is normal.    Lab Results  Component Value Date   CD4TCELL 22* 07/27/2015   Lab Results  Component Value Date   CD4TABS 450 07/27/2015   CD4TABS 360* 05/11/2015   CD4TABS 340* 12/02/2014   Lab Results  Component Value Date   HIV1RNAQUANT <20 07/27/2015   Lab Results  Component Value Date   HEPBSAB REACTIVE* 08/22/2012   No results found for: RPR  CBC Lab Results  Component Value Date   WBC 6.3 09/04/2015   RBC 4.18* 09/04/2015   HGB 13.6 09/04/2015   HCT 41.0 09/04/2015   PLT 123* 09/04/2015   MCV 98.1 09/04/2015   MCH 32.5 09/04/2015   MCHC 33.2 09/04/2015   RDW 13.8 09/04/2015   LYMPHSABS 0.2* 09/04/2015   MONOABS 0.5 09/04/2015   EOSABS 0.1 09/04/2015   BASOSABS 0.0 09/04/2015   BMET Lab Results  Component Value Date   NA 138 09/04/2015   K 3.6 09/04/2015   CL 104  09/04/2015   CO2 22 09/04/2015   GLUCOSE 103* 09/04/2015   BUN 7 09/04/2015   CREATININE 1.28* 09/04/2015   CALCIUM 8.8* 09/04/2015   GFRNONAA >60 09/04/2015   GFRAA >60 09/04/2015     Assessment and Plan   hiv disease = spent considerable time discussing possible change from current regimen to genvoya plus Darunavir. Still reluctant to do so. We have compromised in changing him off of truvada to descovy. Monitor how he does. If he tolerates it, he maybe more apt to change to genvoya. currenlty takes all meds at night including both tabs of RLG  URI = possibly flu vs other prolonged resp viral infection. Will give him rx of tussionex  Give him letter to come back to work on Monday 3/20  Spent 45 min with patient with greater than 50% in counseling on hiv medicaitons

## 2015-10-02 ENCOUNTER — Other Ambulatory Visit: Payer: Self-pay | Admitting: Infectious Diseases

## 2015-10-07 ENCOUNTER — Emergency Department (HOSPITAL_COMMUNITY)
Admission: EM | Admit: 2015-10-07 | Discharge: 2015-10-07 | Disposition: A | Discharge status: Home / Self Care | Payer: Medicare Other | Attending: Emergency Medicine | Admitting: Emergency Medicine

## 2015-10-07 ENCOUNTER — Emergency Department (HOSPITAL_COMMUNITY): Payer: Medicare Other

## 2015-10-07 DIAGNOSIS — Z8614 Personal history of Methicillin resistant Staphylococcus aureus infection: Secondary | ICD-10-CM | POA: Diagnosis not present

## 2015-10-07 DIAGNOSIS — Z7951 Long term (current) use of inhaled steroids: Secondary | ICD-10-CM | POA: Diagnosis not present

## 2015-10-07 DIAGNOSIS — Z87442 Personal history of urinary calculi: Secondary | ICD-10-CM | POA: Insufficient documentation

## 2015-10-07 DIAGNOSIS — R51 Headache: Secondary | ICD-10-CM | POA: Diagnosis present

## 2015-10-07 DIAGNOSIS — D649 Anemia, unspecified: Secondary | ICD-10-CM | POA: Insufficient documentation

## 2015-10-07 DIAGNOSIS — J45909 Unspecified asthma, uncomplicated: Secondary | ICD-10-CM | POA: Diagnosis not present

## 2015-10-07 DIAGNOSIS — Z21 Asymptomatic human immunodeficiency virus [HIV] infection status: Secondary | ICD-10-CM | POA: Insufficient documentation

## 2015-10-07 DIAGNOSIS — Z8701 Personal history of pneumonia (recurrent): Secondary | ICD-10-CM | POA: Diagnosis not present

## 2015-10-07 DIAGNOSIS — Z87828 Personal history of other (healed) physical injury and trauma: Secondary | ICD-10-CM | POA: Diagnosis not present

## 2015-10-07 DIAGNOSIS — Z87891 Personal history of nicotine dependence: Secondary | ICD-10-CM | POA: Diagnosis not present

## 2015-10-07 DIAGNOSIS — K219 Gastro-esophageal reflux disease without esophagitis: Secondary | ICD-10-CM | POA: Insufficient documentation

## 2015-10-07 DIAGNOSIS — I10 Essential (primary) hypertension: Secondary | ICD-10-CM | POA: Diagnosis not present

## 2015-10-07 DIAGNOSIS — R079 Chest pain, unspecified: Secondary | ICD-10-CM | POA: Diagnosis not present

## 2015-10-07 DIAGNOSIS — M1711 Unilateral primary osteoarthritis, right knee: Secondary | ICD-10-CM | POA: Insufficient documentation

## 2015-10-07 DIAGNOSIS — Z79899 Other long term (current) drug therapy: Secondary | ICD-10-CM | POA: Insufficient documentation

## 2015-10-07 DIAGNOSIS — Z880 Allergy status to penicillin: Secondary | ICD-10-CM | POA: Insufficient documentation

## 2015-10-07 DIAGNOSIS — Z7982 Long term (current) use of aspirin: Secondary | ICD-10-CM | POA: Diagnosis not present

## 2015-10-07 DIAGNOSIS — Z872 Personal history of diseases of the skin and subcutaneous tissue: Secondary | ICD-10-CM | POA: Insufficient documentation

## 2015-10-07 DIAGNOSIS — R519 Headache, unspecified: Secondary | ICD-10-CM

## 2015-10-07 DIAGNOSIS — Z8546 Personal history of malignant neoplasm of prostate: Secondary | ICD-10-CM | POA: Insufficient documentation

## 2015-10-07 LAB — BASIC METABOLIC PANEL
Anion gap: 10 (ref 5–15)
BUN: 6 mg/dL (ref 6–20)
CO2: 25 mmol/L (ref 22–32)
Calcium: 9.1 mg/dL (ref 8.9–10.3)
Chloride: 104 mmol/L (ref 101–111)
Creatinine, Ser: 1.24 mg/dL (ref 0.61–1.24)
GFR calc Af Amer: >60 mL/min (ref >60)
GFR calc non Af Amer: >60 mL/min (ref >60)
Glucose, Bld: 102 mg/dL — ABNORMAL HIGH (ref 65–99)
Potassium: 3.7 mmol/L (ref 3.5–5.1)
Sodium: 139 mmol/L (ref 135–145)

## 2015-10-07 LAB — CBC
HCT: 40.3 % (ref 39.0–52.0)
Hemoglobin: 13.4 g/dL (ref 13.0–17.0)
MCH: 31.6 pg (ref 26.0–34.0)
MCHC: 33.3 g/dL (ref 30.0–36.0)
MCV: 95 fL (ref 78.0–100.0)
Platelets: 135 10*3/uL — ABNORMAL LOW (ref 150–400)
RBC: 4.24 MIL/uL (ref 4.22–5.81)
RDW: 14.8 % (ref 11.5–15.5)
WBC: 5.5 10*3/uL (ref 4.0–10.5)

## 2015-10-07 LAB — I-STAT TROPONIN, ED: Troponin i, poc: 0 ng/mL (ref 0.00–0.08)

## 2015-10-07 MED ORDER — DIPHENHYDRAMINE HCL 25 MG PO CAPS
50.0000 mg | ORAL_CAPSULE | Freq: Once | ORAL | Status: AC
  Administered 2015-10-07: 50 mg via ORAL
  Filled 2015-10-07: qty 2

## 2015-10-07 MED ORDER — IBUPROFEN 400 MG PO TABS
600.0000 mg | ORAL_TABLET | Freq: Once | ORAL | Status: AC
  Administered 2015-10-07: 600 mg via ORAL
  Filled 2015-10-07: qty 1

## 2015-10-07 MED ORDER — METOCLOPRAMIDE HCL 10 MG PO TABS
10.0000 mg | ORAL_TABLET | Freq: Once | ORAL | Status: AC
  Administered 2015-10-07: 10 mg via ORAL
  Filled 2015-10-07: qty 1

## 2015-10-07 NOTE — ED Notes (Addendum)
Presents with hypertension-pt was sent home from his job today in [place] and put on a plane due to hypertension, BP has been 190/100s since Monday. Reports feeling clammy, having sever headache and having pain in right neck and into right chest. He is alert, oreinted endorses tingling on top of head. BP here 144/99.  He denies history of High blood pressure

## 2015-10-07 NOTE — ED Notes (Signed)
Pt departed in NAD.  

## 2015-10-07 NOTE — Discharge Instructions (Signed)
How to Take Your Blood Pressure Casey Brennan, see your primary care doctor within 3 days for close follow up.  Take ibuprofen as needed for your headache. If any symptoms worsen, come back to the ED immediately. Thank you. HOW DO I GET A BLOOD PRESSURE MACHINE?  You can buy an electronic home blood pressure machine at your local pharmacy. Insurance will sometimes cover the cost if you have a prescription.  Ask your doctor what type of machine is best for you. There are different machines for your arm and your wrist.  If you decide to buy a machine to check your blood pressure on your arm, first check the size of your arm so you can buy the right size cuff. To check the size of your arm:   Use a measuring tape that shows both inches and centimeters.   Wrap the measuring tape around the upper-middle part of your arm. You may need someone to help you measure.   Write down your arm measurement in both inches and centimeters.   To measure your blood pressure correctly, it is important to have the right size cuff.   If your arm is up to 13 inches (up to 34 centimeters), get an adult cuff size.  If your arm is 13 to 17 inches (35 to 44 centimeters), get a large adult cuff size.    If your arm is 17 to 20 inches (45 to 52 centimeters), get an adult thigh cuff.  WHAT DO THE NUMBERS MEAN?   There are two numbers that make up your blood pressure. For example: 120/80.  The first number (120 in our example) is called the "systolic pressure." It is a measure of the pressure in your blood vessels when your heart is pumping blood.  The second number (80 in our example) is called the "diastolic pressure." It is a measure of the pressure in your blood vessels when your heart is resting between beats.  Your doctor will tell you what your blood pressure should be. WHAT SHOULD I DO BEFORE I CHECK MY BLOOD PRESSURE?   Try to rest or relax for at least 30 minutes before you check your blood  pressure.  Do not smoke.  Do not have any drinks with caffeine, such as:  Soda.  Coffee.  Tea.  Check your blood pressure in a quiet room.  Sit down and stretch out your arm on a table. Keep your arm at about the level of your heart. Let your arm relax.  Make sure that your legs are not crossed. HOW DO I CHECK MY BLOOD PRESSURE?  Follow the directions that came with your machine.  Make sure you remove any tight-fitting clothing from your arm or wrist. Wrap the cuff around your upper arm or wrist. You should be able to fit a finger between the cuff and your arm. If you cannot fit a finger between the cuff and your arm, it is too tight and should be removed and rewrapped.  Some units require you to manually pump up the arm cuff.  Automatic units inflate the cuff when you press a button.  Cuff deflation is automatic in both models.  After the cuff is inflated, the unit measures your blood pressure and pulse. The readings are shown on a monitor. Hold still and breathe normally while the cuff is inflated.  Getting a reading takes less than a minute.  Some models store readings in a memory. Some provide a printout of readings. If your  machine does not store your readings, keep a written record.  Take readings with you to your next visit with your doctor.   This information is not intended to replace advice given to you by your health care provider. Make sure you discuss any questions you have with your health care provider.   Document Released: 06/01/2008 Document Revised: 07/10/2014 Document Reviewed: 08/14/2013 Elsevier Interactive Patient Education [employer].

## 2015-10-07 NOTE — ED Provider Notes (Signed)
CSN: [id number]     Arrival date & time 10/07/15  0224 History  By signing my name below, I, Hansel Feinstein, attest that this documentation has been prepared under the direction and in the presence of Everlene Balls, MD. Electronically Signed: Hansel Feinstein, ED Scribe. 10/07/2015. 4:11 AM.    Chief Complaint  Patient presents with  . Headache  . Chest Pain   The history is provided by the patient. No language interpreter was used.   HPI Comments: Casey Brennan is a 55 y.o. male with h/o HIV, pericarditis who presents to the Emergency Department complaining of moderate, intermittent right-sided HA with radiation to the neck onset a week ago. Pt also notes his BP was elevated today and was sent home from his job in [place] as a result. He just got off the plane from [place]. He denies CP or SOB during the 4 hour flight. Per pt, he does not tend to get a lot of headaches at baseline. Pt states his BP tends to be around 117/68. Denies numbness or weakness to the extremities.   Past Medical History  Diagnosis Date  . HIV (human immunodeficiency virus infection) ([place]) 1988  . Chronic anemia     normocytic  . Chronic folliculitis   . Gross hematuria   . Elevated PSA   . H/O pericarditis     2010--  myopercarditis--  resolved  . History of concussion     2012  &  2013  RESIDUAL HA'S --  RESOLVED  . Axillary lymphadenopathy   . History of MRSA infection 2010    infected boil  . History of gastric ulcer   . Lytic bone lesion of hip     WORK-UP DONE BY ONCOLOGIST DR HA --  NOT MALIGNANT  . Wears glasses   . Headache(784.0)     HX SEVERE FRONTAL HA'S  . Ulcer     hx of gastric  . Asthma     very rare  . Arthritis     r knee   . GERD (gastroesophageal reflux disease)   . Bronchitis   . Post concussion syndrome     resolved  . Prostate cancer ([place]) 04/25/13    gleason 3+3=6, volume 30 gm  . Environmental and seasonal allergies   . Pneumonia     hx of  . HCAP (healthcare-associated  pneumonia) 11/19/2014  . Kidney stones   . Sinusitis, chronic 05/14/2015   Past Surgical History  Procedure Laterality Date  . Upper leg soft tissue biopsy Left 2012    lthigh  . Knee arthroscopy Right 1985  . Colonoscopy  12/26/2011    Procedure: COLONOSCOPY;  Surgeon: Lear Ng, MD;  Location: [facility];  Service: Endoscopy;  Laterality: N/A;  . Esophagogastroduodenoscopy  12/26/2011    Procedure: ESOPHAGOGASTRODUODENOSCOPY (EGD);  Surgeon: Lear Ng, MD;  Location: [facility];  Service: Endoscopy;  Laterality: N/A;  . Excisional bx left breast mass/  i  &  d left breast abscess  03-24-2009  . Excision chronic left breast abscess  09-21-2010  . Cystoscopy/retrograde/ureteroscopy Bilateral 04/25/2013    Procedure: CYSTOSCOPY/ BILATERAL RETROGRADES; BLADDER BIOPSIES;  Surgeon: Alexis Frock, MD;  Location: [facility];  Service: Urology;  Laterality: Bilateral;  . Prostate biopsy N/A 04/25/2013    Procedure: BIOPSY TRANSRECTAL ULTRASONIC PROSTATE (TUBP);  Surgeon: Alexis Frock, MD;  Location: [facility];  Service: Urology;  Laterality: N/A;  . Left hip biopsy  10/2012  . Left axilla biopsy  04/2013  . Robot assisted laparoscopic radical prostatectomy N/A 08/18/2013    Procedure: ROBOTIC ASSISTED LAPAROSCOPIC RADICAL PROSTATECTOMY;  Surgeon: Alexis Frock, MD;  Location: [facility];  Service: Urology;  Laterality: N/A;  . Lymphadenectomy Bilateral 08/18/2013    Procedure: LYMPHADENECTOMY "PELVIC LYMPH NODE DISSECTION";  Surgeon: Alexis Frock, MD;  Location: [facility];  Service: Urology;  Laterality: Bilateral;  . Esophagogastroduodenoscopy (egd) with propofol N/A 07/29/2014    Procedure: ESOPHAGOGASTRODUODENOSCOPY (EGD) WITH PROPOFOL;  Surgeon: Lear Ng, MD;  Location: [facility];  Service: Endoscopy;  Laterality: N/A;  . Colonoscopy with propofol N/A 07/29/2014    Procedure: COLONOSCOPY WITH PROPOFOL;  Surgeon: Lear Ng, MD;  Location: [facility];  Service: Endoscopy;  Laterality: N/A;  . Cardiac catheterization  03-23-2009  DR COOPER    NORMAL CORONARY ARTERIES  . Dental examination under anesthesia    . Cholecystectomy N/A 11/06/2014    Procedure: LAPAROSCOPIC CHOLECYSTECTOMY WITH INTRAOPERATIVE CHOLANGIOGRAM;  Surgeon: Judeth Horn, MD;  Location: [facility];  Service: General;  Laterality: N/A;   Family History  Problem Relation Age of Onset  . Stroke Father   . Diabetes Father   . Cancer Father     brain cancer  . Hypertension Sister   . Cancer Maternal Uncle     prostate cancer  . Asthma Sister   . Asthma Father    Social History  Substance Use Topics  . Smoking status: Former Smoker -- 1.50 packs/day for 23 years    Types: Cigars, Cigarettes    Quit date: 05/22/2010  . Smokeless tobacco: Never Used  . Alcohol Use: No    Review of Systems A complete 10 system review of systems was obtained and all systems are negative except as noted in the HPI and PMH.    Allergies  Morphine and related; Penicillins; Sulfonamide derivatives; Tramadol; and Tylenol  Home Medications   Prior to Admission medications   Medication Sig Start Date End Date Taking? Authorizing Provider  albuterol (PROVENTIL HFA;VENTOLIN HFA) 108 (90 BASE) MCG/ACT inhaler Inhale 2 puffs into the lungs every 4 (four) hours as needed for wheezing or shortness of breath. 11/11/14   Noemi Chapel, MD  albuterol (PROVENTIL) (2.5 MG/3ML) 0.083% nebulizer solution Take 3 mLs (2.5 mg total) by nebulization every 6 (six) hours as needed for wheezing or shortness of breath. Patient taking differently: Take 2.5 mg by nebulization 3 (three) times daily as needed for wheezing or shortness of breath. Mix with ipratropium 11/11/14   Noemi Chapel, MD  aspirin EC 81 MG tablet Take 81 mg by mouth every Monday, Wednesday, and Friday. At bedtime    Historical Provider, MD  Beclomethasone Dipropionate (QNASL) 80 MCG/ACT AERS Place 2 sprays into  both nostrils daily.    Historical Provider, MD  chlorpheniramine-HYDROcodone (TUSSIONEX) 10-8 MG/5ML SUER Take 5 mLs by mouth every 12 (twelve) hours as needed for cough. 09/15/15   Carlyle Basques, MD  cyanocobalamin (,VITAMIN B-12,) 1000 MCG/ML injection Inject 1 mL (1,000 mcg total) into the muscle once. Patient taking differently: Inject 1,000 mcg into the muscle every 30 (thirty) days. Last injection beginning of January 07/21/15   Campbell Riches, MD  Darunavir Ethanolate (PREZISTA) 800 MG tablet Take 1 tablet (800 mg total) by mouth daily. Patient taking differently: Take 800 mg by mouth at bedtime.  07/21/15   Campbell Riches, MD  diclofenac sodium (VOLTAREN) 1 % GEL APPLY TO THE AFFECTED AREA TWICE DAILY AS NEEDED Patient taking differently: Apply 1 application topically 2 (  two) times daily as needed (pain).  02/05/15   Campbell Riches, MD  diphenhydrAMINE (BENADRYL) 25 MG tablet Take 25 mg by mouth every 6 (six) hours as needed for allergies (congestion).    Historical Provider, MD  emtricitabine-tenofovir AF (DESCOVY) 200-25 MG tablet Take 1 tablet by mouth daily. 09/15/15   Carlyle Basques, MD  ferrous sulfate 325 (65 FE) MG tablet Take 1 tablet (325 mg total) by mouth daily with breakfast. 11/21/14   Langley Gauss Moding, MD  fexofenadine-pseudoephedrine (ALLEGRA-D) 60-120 MG 12 hr tablet Take 1 tablet by mouth at bedtime.    Historical Provider, MD  gabapentin (NEURONTIN) 300 MG capsule Take 1 capsule (300 mg total) by mouth 4 (four) times daily as needed (nerve pain). 09/07/15   Dennie Bible, NP  guaiFENesin (MUCINEX) 600 MG 12 hr tablet Take 600 mg by mouth 2 (two) times daily as needed for cough or to loosen phlegm.    Historical Provider, MD  hydrOXYzine (ATARAX/VISTARIL) 25 MG tablet TAKE 1 TABLET(25 MG) BY MOUTH EVERY 6 HOURS AS NEEDED FOR ANXIETY Patient taking differently: TAKE 1 TABLET(25 MG) BY MOUTH EVERY 6 HOURS AS NEEDED FOR ITCHING 04/16/15   Campbell Riches, MD   ipratropium (ATROVENT) 0.02 % nebulizer solution Take 2.5 mLs (0.5 mg total) by nebulization 3 (three) times daily as needed for wheezing or shortness of breath. Mix with xopenex Patient taking differently: Take 0.5 mg by nebulization 3 (three) times daily as needed for wheezing or shortness of breath. Mix with albuterol 02/13/14   Carly J Rivet, MD  mometasone (NASONEX) 50 MCG/ACT nasal spray Place 2 sprays into the nose daily. 03/09/15   Julianne Rice, MD  mometasone-formoterol [facility]) 200-5 MCG/ACT AERO Take 2 puffs first thing in am and then another 2 puffs about 12 hours later. Patient taking differently: Inhale 2 puffs into the lungs every 12 (twelve) hours.  07/22/14   Tanda Rockers, MD  Multiple Vitamin (MULTIVITAMIN WITH MINERALS) TABS tablet Take 1 tablet by mouth at bedtime.    Historical Provider, MD  mupirocin ointment (BACTROBAN) 2 % APPLY TO THE AFFECTED AREA TWICE DAILY FOR 2 WEEKS, PLACE COTTON BETWEEN TOES TO ALLOW BETTER AERATION AS NEEDED 07/28/15   Campbell Riches, MD  NORVIR 100 MG TABS tablet Take 1 tablet (100 mg total) by mouth daily. Patient taking differently: Take 100 mg by mouth at bedtime.  07/21/15   Campbell Riches, MD  omeprazole (PRILOSEC) 20 MG capsule Take 20 mg by mouth at bedtime. 07/27/15   Historical Provider, MD  ondansetron (ZOFRAN) 4 MG tablet Take 1 tablet (4 mg total) by mouth every 8 (eight) hours as needed for nausea or vomiting (ONLY AS NEEDED). 10/04/15   Campbell Riches, MD  Polyvinyl Alcohol-Povidone (REFRESH OP) Place 1 drop into both eyes daily.     Historical Provider, MD  raltegravir (ISENTRESS) 400 MG tablet Take 1 tablet (400 mg total) by mouth 2 (two) times daily. Patient taking differently: Take 800 mg by mouth at bedtime.  07/21/15   Campbell Riches, MD  tizanidine (ZANAFLEX) 6 MG capsule Take 1 capsule (6 mg total) by mouth 3 (three) times daily as needed for muscle spasms. 09/07/15   Dennie Bible, NP  traZODone (DESYREL) 100 MG  tablet Take 100 mg by mouth at bedtime as needed for sleep.    Historical Provider, MD   BP 144/99 mmHg  Pulse 71  Temp(Src) 97.6 F (36.4 C) (Oral)  Resp 18  SpO2 100% Physical Exam  Constitutional: He is oriented to person, place, and time. Vital signs are normal. He appears well-developed and well-nourished.  Non-toxic appearance. He does not appear ill. No distress.  HENT:  Head: Normocephalic and atraumatic.  Nose: Nose normal.  Mouth/Throat: Oropharynx is clear and moist. No oropharyngeal exudate.  Eyes: Conjunctivae and EOM are normal. Pupils are equal, round, and reactive to light. No scleral icterus.  Neck: Normal range of motion. Neck supple. No tracheal deviation, no edema, no erythema and normal range of motion present. No thyroid mass and no thyromegaly present.  Cardiovascular: Normal rate, regular rhythm, S1 normal, S2 normal, normal heart sounds, intact distal pulses and normal pulses.  Exam reveals no gallop and no friction rub.   No murmur heard. Pulmonary/Chest: Effort normal and breath sounds normal. No respiratory distress. He has no wheezes. He has no rhonchi. He has no rales.  Abdominal: Soft. Normal appearance and bowel sounds are normal. He exhibits no distension, no ascites and no mass. There is no hepatosplenomegaly. There is no tenderness. There is no rebound, no guarding and no CVA tenderness.  Musculoskeletal: Normal range of motion. He exhibits no edema or tenderness.  Lymphadenopathy:    He has no cervical adenopathy.  Neurological: He is alert and oriented to person, place, and time. He has normal strength. No cranial nerve deficit or sensory deficit.  Strength and sensation equal and intact bilaterally throughout the upper and lower extremities.Normal gait. Coordination intact.  Cranial nerves 2-12 grossly intact. Normal finger to nose testing.    Skin: Skin is warm, dry and intact. No petechiae and no rash noted. He is not diaphoretic. No erythema. No  pallor.  Psychiatric: He has a normal mood and affect. His behavior is normal. Judgment normal.  Nursing note and vitals reviewed.   ED Course  Procedures (including critical care time) DIAGNOSTIC STUDIES: Oxygen Saturation is 100% on RA, normal by my interpretation.    COORDINATION OF CARE: 4:05 AM Discussed treatment plan with pt at bedside which includes lab work, CXR, EKG and pt agreed to plan.   Labs Review Labs Reviewed  BASIC METABOLIC PANEL - Abnormal; Notable for the following:    Glucose, Bld 102 (*)    All other components within normal limits  CBC - Abnormal; Notable for the following:    Platelets 135 (*)    All other components within normal limits  I-STAT TROPOININ, ED    Imaging Review Dg Chest 2 View  10/07/2015  CLINICAL DATA:  Chest pain tonight. EXAM: CHEST  2 VIEW COMPARISON:  Radiographs 09/04/2015 FINDINGS: The cardiomediastinal contours are normal. The lungs are clear. Pulmonary vasculature is normal. No consolidation, pleural effusion, or pneumothorax. Bilateral nipple shadows noted, confirmed with nipple markers 1 month prior. No acute osseous abnormalities are seen. IMPRESSION: No acute pulmonary process. Bilateral nipple shadows, confirmed with nipple markers 1 month prior. Electronically Signed   By: Jeb Levering M.D.   On: 10/07/2015 03:06   I have personally reviewed and evaluated these images and lab results as part of my medical decision-making.   EKG Interpretation None      MDM   Final diagnoses:  None    Patient presents to the ED for evaluation of HTN.  Here his BP is 140s/90s.  His neurological exam is normal and he denies every having CP or SOB.  I do not believe the patient has hypertensive emergency.  He does complain of headache and was given ibuprofen, reglan  and benadryl.  I counseled patient on HTN and need to follow up with PCP.  I do not feel comfortable starting him on medication from the ED as his normal baseline is 110s and  I do not want to drop him too low.  PCP fu advised within 3 days. He appears well and in NAD.  VS remain within his normal limits and he is safe for DC.  I personally performed the services described in this documentation, which was scribed in my presence. The recorded information has been reviewed and is accurate.     Everlene Balls, MD 10/07/15 [phone]

## 2015-10-11 ENCOUNTER — Encounter: Payer: Self-pay | Admitting: *Deleted

## 2015-10-11 ENCOUNTER — Telehealth: Payer: Self-pay | Admitting: *Deleted

## 2015-10-11 NOTE — Telephone Encounter (Signed)
Patient called requesting a letter for his employer showing his last two blood pressures at this office. Letter provided and placed up front for pick up.

## 2015-10-14 ENCOUNTER — Encounter (HOSPITAL_COMMUNITY): Payer: Self-pay

## 2015-10-14 ENCOUNTER — Ambulatory Visit (HOSPITAL_COMMUNITY)
Admission: EM | Admit: 2015-10-14 | Discharge: 2015-10-14 | Disposition: A | Discharge status: Home / Self Care | Payer: Medicare Other | Attending: Family Medicine | Admitting: Family Medicine

## 2015-10-14 DIAGNOSIS — Z136 Encounter for screening for cardiovascular disorders: Secondary | ICD-10-CM

## 2015-10-14 DIAGNOSIS — Z0130 Encounter for examination of blood pressure without abnormal findings: Secondary | ICD-10-CM

## 2015-10-14 DIAGNOSIS — I10 Essential (primary) hypertension: Secondary | ICD-10-CM | POA: Diagnosis not present

## 2015-10-14 MED ORDER — BECLOMETHASONE DIPROPIONATE 80 MCG/ACT NA AERS: 2.0000 | INHALATION_SPRAY | Freq: Once | NASAL | Status: DC

## 2015-10-14 MED ORDER — HYDROCHLOROTHIAZIDE 25 MG PO TABS: 25.0000 mg | ORAL_TABLET | Freq: Every day | ORAL | Status: DC

## 2015-10-14 MED ORDER — ALBUTEROL SULFATE HFA 108 (90 BASE) MCG/ACT IN AERS: 2.0000 | INHALATION_SPRAY | RESPIRATORY_TRACT | Status: DC | PRN

## 2015-10-14 NOTE — ED Notes (Signed)
Patient discharged by Frank Patrick, PA 

## 2015-10-14 NOTE — Discharge Instructions (Signed)
DASH Eating Plan °DASH stands for "Dietary Approaches to Stop Hypertension." The DASH eating plan is a healthy eating plan that has been shown to reduce high blood pressure (hypertension). Additional health benefits may include reducing the risk of type 2 diabetes mellitus, heart disease, and stroke. The DASH eating plan may also help with weight loss. °WHAT DO I NEED TO KNOW ABOUT THE DASH EATING PLAN? °For the DASH eating plan, you will follow these general guidelines: °· Choose foods with a percent daily value for sodium of less than 5% (as listed on the food label). °· Use salt-free seasonings or herbs instead of table salt or sea salt. °· Check with your health care provider or pharmacist before using salt substitutes. °· Eat lower-sodium products, often labeled as "lower sodium" or "no salt added." °· Eat fresh foods. °· Eat more vegetables, fruits, and low-fat dairy products. °· Choose whole grains. Look for the word "whole" as the first word in the ingredient list. °· Choose fish and skinless chicken or turkey more often than red meat. Limit fish, poultry, and meat to 6 oz (170 g) each day. °· Limit sweets, desserts, sugars, and sugary drinks. °· Choose heart-healthy fats. °· Limit cheese to 1 oz (28 g) per day. °· Eat more home-cooked food and less restaurant, buffet, and fast food. °· Limit fried foods. °· Cook foods using methods other than frying. °· Limit canned vegetables. If you do use them, rinse them well to decrease the sodium. °· When eating at a restaurant, ask that your food be prepared with less salt, or no salt if possible. °WHAT FOODS CAN I EAT? °Seek help from a dietitian for individual calorie needs. °Grains °Whole grain or whole wheat bread. Brown rice. Whole grain or whole wheat pasta. Quinoa, bulgur, and whole grain cereals. Low-sodium cereals. Corn or whole wheat flour tortillas. Whole grain cornbread. Whole grain crackers. Low-sodium crackers. °Vegetables °Fresh or frozen vegetables  (raw, steamed, roasted, or grilled). Low-sodium or reduced-sodium tomato and vegetable juices. Low-sodium or reduced-sodium tomato sauce and paste. Low-sodium or reduced-sodium canned vegetables.  °Fruits °All fresh, canned (in natural juice), or frozen fruits. °Meat and Other Protein Products °Ground beef (85% or leaner), grass-fed beef, or beef trimmed of fat. Skinless chicken or turkey. Ground chicken or turkey. Pork trimmed of fat. All fish and seafood. Eggs. Dried beans, peas, or lentils. Unsalted nuts and seeds. Unsalted canned beans. °Dairy °Low-fat dairy products, such as skim or 1% milk, 2% or reduced-fat cheeses, low-fat ricotta or cottage cheese, or plain low-fat yogurt. Low-sodium or reduced-sodium cheeses. °Fats and Oils °Tub margarines without trans fats. Light or reduced-fat mayonnaise and salad dressings (reduced sodium). Avocado. Safflower, olive, or canola oils. Natural peanut or almond butter. °Other °Unsalted popcorn and pretzels. °The items listed above may not be a complete list of recommended foods or beverages. Contact your dietitian for more options. °WHAT FOODS ARE NOT RECOMMENDED? °Grains °White bread. White pasta. White rice. Refined cornbread. Bagels and croissants. Crackers that contain trans fat. °Vegetables °Creamed or fried vegetables. Vegetables in a cheese sauce. Regular canned vegetables. Regular canned tomato sauce and paste. Regular tomato and vegetable juices. °Fruits °Dried fruits. Canned fruit in light or heavy syrup. Fruit juice. °Meat and Other Protein Products °Fatty cuts of meat. Ribs, chicken wings, bacon, sausage, bologna, salami, chitterlings, fatback, hot dogs, bratwurst, and packaged luncheon meats. Salted nuts and seeds. Canned beans with salt. °Dairy °Whole or 2% milk, cream, half-and-half, and cream cheese. Whole-fat or sweetened yogurt. Full-fat   cheeses or blue cheese. Nondairy creamers and whipped toppings. Processed cheese, cheese spreads, or cheese  curds. °Condiments °Onion and garlic salt, seasoned salt, table salt, and sea salt. Canned and packaged gravies. Worcestershire sauce. Tartar sauce. Barbecue sauce. Teriyaki sauce. Soy sauce, including reduced sodium. Steak sauce. Fish sauce. Oyster sauce. Cocktail sauce. Horseradish. Ketchup and mustard. Meat flavorings and tenderizers. Bouillon cubes. Hot sauce. Tabasco sauce. Marinades. Taco seasonings. Relishes. °Fats and Oils °Butter, stick margarine, lard, shortening, ghee, and bacon fat. Coconut, palm kernel, or palm oils. Regular salad dressings. °Other °Pickles and olives. Salted popcorn and pretzels. °The items listed above may not be a complete list of foods and beverages to avoid. Contact your dietitian for more information. °WHERE CAN I FIND MORE INFORMATION? °[employer], [employer]: www.nhlbi.nih.gov/health/health-topics/topics/dash/ °  °This information is not intended to replace advice given to you by your health care provider. Make sure you discuss any questions you have with your health care provider. °  °Document Released: 06/08/2011 Document Revised: 07/10/2014 Document Reviewed: 04/23/2013 °Elsevier Interactive Patient Education ©2016 [employer] Inc[employer] ° °Hypertension °Hypertension, commonly called high blood pressure, is when the force of blood pumping through your arteries is too strong. Your arteries are the blood vessels that carry blood from your heart throughout your body. A blood pressure reading consists of a higher number over a lower number, such as 110/72. The higher number (systolic) is the pressure inside your arteries when your heart pumps. The lower number (diastolic) is the pressure inside your arteries when your heart relaxes. Ideally you want your blood pressure below 120/80. °Hypertension forces your heart to work harder to pump blood. Your arteries may become narrow or stiff. Having untreated or uncontrolled hypertension can cause heart attack, stroke, kidney  disease, and other problems. °RISK FACTORS °Some risk factors for high blood pressure are controllable. Others are not.  °Risk factors you cannot control include:  °· Race. You may be at higher risk if you are African American. °· Age. Risk increases with age. °· Gender. Men are at higher risk than women before age 45 years. After age 65, women are at higher risk than men. °Risk factors you can control include: °· Not getting enough exercise or physical activity. °· Being overweight. °· Getting too much fat, sugar, calories, or salt in your diet. °· Drinking too much alcohol. °SIGNS AND SYMPTOMS °Hypertension does not usually cause signs or symptoms. Extremely high blood pressure (hypertensive crisis) may cause headache, anxiety, shortness of breath, and nosebleed. °DIAGNOSIS °To check if you have hypertension, your health care provider will measure your blood pressure while you are seated, with your arm held at the level of your heart. It should be measured at least twice using the same arm. Certain conditions can cause a difference in blood pressure between your right and left arms. A blood pressure reading that is higher than normal on one occasion does not mean that you need treatment. If it is not clear whether you have high blood pressure, you may be asked to return on a different day to have your blood pressure checked again. Or, you may be asked to monitor your blood pressure at home for 1 or more weeks. °TREATMENT °Treating high blood pressure includes making lifestyle changes and possibly taking medicine. Living a healthy lifestyle can help lower high blood pressure. You may need to change some of your habits. °Lifestyle changes may include: °· Following the DASH diet. This diet is high in fruits, vegetables, and whole   grains. It is low in salt, red meat, and added sugars. °· Keep your sodium intake below 2,300 mg per day. °· Getting at least 30-45 minutes of aerobic exercise at least 4 times per  week. °· Losing weight if necessary. °· Not smoking. °· Limiting alcoholic beverages. °· Learning ways to reduce stress. °Your health care provider may prescribe medicine if lifestyle changes are not enough to get your blood pressure under control, and if one of the following is true: °· You are 18-59 years of age and your systolic blood pressure is above 140. °· You are 60 years of age or older, and your systolic blood pressure is above 150. °· Your diastolic blood pressure is above 90. °· You have diabetes, and your systolic blood pressure is over 140 or your diastolic blood pressure is over 90. °· You have kidney disease and your blood pressure is above 140/90. °· You have heart disease and your blood pressure is above 140/90. °Your personal target blood pressure may vary depending on your medical conditions, your age, and other factors. °HOME CARE INSTRUCTIONS °· Have your blood pressure rechecked as directed by your health care provider.   °· Take medicines only as directed by your health care provider. Follow the directions carefully. Blood pressure medicines must be taken as prescribed. The medicine does not work as well when you skip doses. Skipping doses also puts you at risk for problems. °· Do not smoke.   °· Monitor your blood pressure at home as directed by your health care provider.  °SEEK MEDICAL CARE IF:  °· You think you are having a reaction to medicines taken. °· You have recurrent headaches or feel dizzy. °· You have swelling in your ankles. °· You have trouble with your vision. °SEEK IMMEDIATE MEDICAL CARE IF: °· You develop a severe headache or confusion. °· You have unusual weakness, numbness, or feel faint. °· You have severe chest or abdominal pain. °· You vomit repeatedly. °· You have trouble breathing. °MAKE SURE YOU:  °· Understand these instructions. °· Will watch your condition. °· Will get help right away if you are not doing well or get worse. °  °This information is not intended to  replace advice given to you by your health care provider. Make sure you discuss any questions you have with your health care provider. °  °Document Released: 06/19/2005 Document Revised: 11/03/2014 Document Reviewed: 04/11/2013 °Elsevier Interactive Patient Education ©2016 Elsevier Inc. ° °

## 2015-10-14 NOTE — ED Notes (Addendum)
Patient presents with elevated BP 107/108 he went to the ED 10/06/2015 and the BP 133/76 and he is here today for a BP recheck  No acute distress Sister at bedside

## 2015-10-16 NOTE — ED Provider Notes (Signed)
CSN: [id number]     Arrival date & time 10/14/15  1845 History   First MD Initiated Contact with Patient 10/14/15 1942     Chief Complaint  Patient presents with  . Hypertension   (Consider location/radiation/quality/duration/timing/severity/associated sxs/prior Treatment) HPI Pt was in [place], for truck driver job. Was told his blood pressure was abnormal 107/168. He was then denied a job and sent home. He is here for blood pressure clearance. Pt states he has no complaints.  Past Medical History  Diagnosis Date  . HIV (human immunodeficiency virus infection) ([place]) 1988  . Chronic anemia     normocytic  . Chronic folliculitis   . Gross hematuria   . Elevated PSA   . H/O pericarditis     2010--  myopercarditis--  resolved  . History of concussion     2012  &  2013  RESIDUAL HA'S --  RESOLVED  . Axillary lymphadenopathy   . History of MRSA infection 2010    infected boil  . History of gastric ulcer   . Lytic bone lesion of hip     WORK-UP DONE BY ONCOLOGIST DR HA --  NOT MALIGNANT  . Wears glasses   . Headache(784.0)     HX SEVERE FRONTAL HA'S  . Ulcer     hx of gastric  . Asthma     very rare  . Arthritis     r knee   . GERD (gastroesophageal reflux disease)   . Bronchitis   . Post concussion syndrome     resolved  . Prostate cancer ([place]) 04/25/13    gleason 3+3=6, volume 30 gm  . Environmental and seasonal allergies   . Pneumonia     hx of  . HCAP (healthcare-associated pneumonia) 11/19/2014  . Kidney stones   . Sinusitis, chronic 05/14/2015   Past Surgical History  Procedure Laterality Date  . Upper leg soft tissue biopsy Left 2012    lthigh  . Knee arthroscopy Right 1985  . Colonoscopy  12/26/2011    Procedure: COLONOSCOPY;  Surgeon: Lear Ng, MD;  Location: [facility];  Service: Endoscopy;  Laterality: N/A;  . Esophagogastroduodenoscopy  12/26/2011    Procedure: ESOPHAGOGASTRODUODENOSCOPY (EGD);  Surgeon: Lear Ng, MD;  Location: [facility];  Service: Endoscopy;  Laterality: N/A;  . Excisional bx left breast mass/  i  &  d left breast abscess  03-24-2009  . Excision chronic left breast abscess  09-21-2010  . Cystoscopy/retrograde/ureteroscopy Bilateral 04/25/2013    Procedure: CYSTOSCOPY/ BILATERAL RETROGRADES; BLADDER BIOPSIES;  Surgeon: Alexis Frock, MD;  Location: [facility];  Service: Urology;  Laterality: Bilateral;  . Prostate biopsy N/A 04/25/2013    Procedure: BIOPSY TRANSRECTAL ULTRASONIC PROSTATE (TUBP);  Surgeon: Alexis Frock, MD;  Location: [facility];  Service: Urology;  Laterality: N/A;  . Left hip biopsy  10/2012  . Left axilla biopsy  04/2013  . Robot assisted laparoscopic radical prostatectomy N/A 08/18/2013    Procedure: ROBOTIC ASSISTED LAPAROSCOPIC RADICAL PROSTATECTOMY;  Surgeon: Alexis Frock, MD;  Location: [facility];  Service: Urology;  Laterality: N/A;  . Lymphadenectomy Bilateral 08/18/2013    Procedure: LYMPHADENECTOMY "PELVIC LYMPH NODE DISSECTION";  Surgeon: Alexis Frock, MD;  Location: [facility];  Service: Urology;  Laterality: Bilateral;  . Esophagogastroduodenoscopy (egd) with propofol N/A 07/29/2014    Procedure: ESOPHAGOGASTRODUODENOSCOPY (EGD) WITH PROPOFOL;  Surgeon: Lear Ng, MD;  Location: [place];  Service: Endoscopy;  Laterality: N/A;  . Colonoscopy with propofol N/A 07/29/2014  Procedure: COLONOSCOPY WITH PROPOFOL;  Surgeon: Lear Ng, MD;  Location: [facility] END[facility]COPY;  Service: Endoscopy;  Laterality: N/A;  . Cardiac catheterization  03-23-2009  DR COOPER    NORMAL CORONARY ARTERIES  . Dental examination under anesthesia    . Cholecystectomy N/A 11/06/2014    Procedure: LAPAROSCOPIC CHOLECYSTECTOMY WITH INTRAOPERATIVE CHOLANGIOGRAM;  Surgeon: Judeth Horn, MD;  Location: [facility] OR;  Service: General;  Laterality: N/A;   Family History  Problem Relation Age of Onset  . Stroke Father   . Diabetes Father   . Cancer Father     brain  cancer  . Hypertension Sister   . Cancer Maternal Uncle     prostate cancer  . Asthma Sister   . Asthma Father    Social History  Substance Use Topics  . Smoking status: Former Smoker -- 1.50 packs/day for 23 years    Types: Cigars, Cigarettes    Quit date: 05/22/2010  . Smokeless tobacco: Never Used  . Alcohol Use: No    Review of Systems Here for blood pressure check Allergies  Morphine and related; Penicillins; Sulfonamide derivatives; Tramadol; and Tylenol  Home Medications   Prior to Admission medications   Medication Sig Start Date End Date Taking? Authorizing Provider  aspirin EC 81 MG tablet Take 81 mg by mouth every Monday, Wednesday, and Friday. At bedtime   Yes Historical Provider, MD  Multiple Vitamin (MULTIVITAMIN WITH MINERALS) TABS tablet Take 1 tablet by mouth at bedtime.   Yes Historical Provider, MD  mupirocin ointment (BACTROBAN) 2 % APPLY TO THE AFFECTED AREA TWICE DAILY FOR 2 WEEKS, PLACE COTTON BETWEEN TOES TO ALLOW BETTER AERATION AS NEEDED 07/28/15  Yes Campbell Riches, MD  ondansetron (ZOFRAN) 4 MG tablet Take 1 tablet (4 mg total) by mouth every 8 (eight) hours as needed for nausea or vomiting (ONLY AS NEEDED). 10/04/15  Yes Campbell Riches, MD  tizanidine (ZANAFLEX) 6 MG capsule Take 1 capsule (6 mg total) by mouth 3 (three) times daily as needed for muscle spasms. 09/07/15  Yes Dennie Bible, NP  traZODone (DESYREL) 100 MG tablet Take 100 mg by mouth at bedtime as needed for sleep.   Yes Historical Provider, MD  TRUVADA 200-300 MG tablet Take 1 tablet by mouth daily. 10/01/15  Yes Historical Provider, MD  albuterol (PROVENTIL HFA;VENTOLIN HFA) 108 (90 BASE) MCG/ACT inhaler Inhale 2 puffs into the lungs every 4 (four) hours as needed for wheezing or shortness of breath. 11/11/14   Noemi Chapel, MD  albuterol (PROVENTIL HFA;VENTOLIN HFA) 108 (90 Base) MCG/ACT inhaler Inhale 2 puffs into the lungs every 4 (four) hours as needed for wheezing or shortness  of breath. 10/14/15   Konrad Felix, PA  albuterol (PROVENTIL) (2.5 MG/3ML) 0.083% nebulizer solution Take 3 mLs (2.5 mg total) by nebulization every 6 (six) hours as needed for wheezing or shortness of breath. Patient taking differently: Take 2.5 mg by nebulization 3 (three) times daily as needed for wheezing or shortness of breath. Mix with ipratropium 11/11/14   Noemi Chapel, MD  Beclomethasone Dipropionate (QNASL) 80 MCG/ACT AERS Place 2 sprays into both nostrils daily.    Historical Provider, MD  Beclomethasone Dipropionate 80 MCG/ACT AERS Place 2 sprays into the nose once. 10/14/15   Konrad Felix, PA  chlorpheniramine-HYDROcodone (TUSSIONEX) 10-8 MG/5ML SUER Take 5 mLs by mouth every 12 (twelve) hours as needed for cough. 09/15/15   Carlyle Basques, MD  cyanocobalamin (,VITAMIN B-12,) 1000 MCG/ML injection Inject 1 mL (1,000 mcg  total) into the muscle once. Patient taking differently: Inject 1,000 mcg into the muscle every 30 (thirty) days. Last injection beginning of January 07/21/15   Campbell Riches, MD  Darunavir Ethanolate (PREZISTA) 800 MG tablet Take 1 tablet (800 mg total) by mouth daily. Patient taking differently: Take 800 mg by mouth at bedtime.  07/21/15   Campbell Riches, MD  diclofenac sodium (VOLTAREN) 1 % GEL APPLY TO THE AFFECTED AREA TWICE DAILY AS NEEDED Patient taking differently: Apply 1 application topically 2 (two) times daily as needed (pain).  02/05/15   Campbell Riches, MD  diphenhydrAMINE (BENADRYL) 25 MG tablet Take 25 mg by mouth every 6 (six) hours as needed for allergies (congestion).    Historical Provider, MD  emtricitabine-tenofovir AF (DESCOVY) 200-25 MG tablet Take 1 tablet by mouth daily. 09/15/15   Carlyle Basques, MD  ferrous sulfate 325 (65 FE) MG tablet Take 1 tablet (325 mg total) by mouth daily with breakfast. 11/21/14   Langley Gauss Moding, MD  fexofenadine-pseudoephedrine (ALLEGRA-D) 60-120 MG 12 hr tablet Take 1 tablet by mouth at bedtime.    Historical  Provider, MD  gabapentin (NEURONTIN) 300 MG capsule Take 1 capsule (300 mg total) by mouth 4 (four) times daily as needed (nerve pain). 09/07/15   Dennie Bible, NP  guaiFENesin (MUCINEX) 600 MG 12 hr tablet Take 600 mg by mouth 2 (two) times daily as needed for cough or to loosen phlegm.    Historical Provider, MD  hydrochlorothiazide (HYDRODIURIL) 25 MG tablet Take 1 tablet (25 mg total) by mouth daily. 10/14/15   Konrad Felix, PA  hydrOXYzine (ATARAX/VISTARIL) 25 MG tablet TAKE 1 TABLET(25 MG) BY MOUTH EVERY 6 HOURS AS NEEDED FOR ANXIETY Patient taking differently: TAKE 1 TABLET(25 MG) BY MOUTH EVERY 6 HOURS AS NEEDED FOR ITCHING 04/16/15   Campbell Riches, MD  ipratropium (ATROVENT) 0.02 % nebulizer solution Take 2.5 mLs (0.5 mg total) by nebulization 3 (three) times daily as needed for wheezing or shortness of breath. Mix with xopenex Patient taking differently: Take 0.5 mg by nebulization 3 (three) times daily as needed for wheezing or shortness of breath. Mix with albuterol 02/13/14   Carly J Rivet, MD  mometasone (NASONEX) 50 MCG/ACT nasal spray Place 2 sprays into the nose daily. 03/09/15   Julianne Rice, MD  mometasone-formoterol [facility]) 200-5 MCG/ACT AERO Take 2 puffs first thing in am and then another 2 puffs about 12 hours later. Patient taking differently: Inhale 2 puffs into the lungs every 12 (twelve) hours.  07/22/14   Tanda Rockers, MD  NORVIR 100 MG TABS tablet Take 1 tablet (100 mg total) by mouth daily. Patient taking differently: Take 100 mg by mouth at bedtime.  07/21/15   Campbell Riches, MD  omeprazole (PRILOSEC) 20 MG capsule Take 20 mg by mouth at bedtime. 07/27/15   Historical Provider, MD  Polyvinyl Alcohol-Povidone (REFRESH OP) Place 1 drop into both eyes daily.     Historical Provider, MD  raltegravir (ISENTRESS) 400 MG tablet Take 1 tablet (400 mg total) by mouth 2 (two) times daily. Patient taking differently: Take 800 mg by mouth at bedtime.  07/21/15    Campbell Riches, MD   Meds Ordered and Administered this Visit  Medications - No data to display  BP 144/93 mmHg  Pulse 82  Temp(Src) 98.9 F (37.2 C) (Oral)  SpO2 96% No data found.   Physical Exam NURSES NOTES AND VITAL SIGNS REVIEWED. CONSTITUTIONAL: Well developed, well nourished,  no acute distress HEENT: normocephalic, atraumatic, right and left TM's are normal EYES: Conjunctiva normal NECK:normal ROM, supple, no adenopathy PULMONARY:No respiratory distress, normal effort, Lungs: CTAb/l, no wheezes, or increased work of breathing CARDIOVASCULAR: RRR, no murmur ABDOMEN: soft, ND, NT, +'ve BS MUSCULOSKELETAL: Normal ROM of all extremities,  SKIN: warm and dry without rash PSYCHIATRIC: Mood and affect, behavior are normal  ED Course  Procedures (including critical care time)  Labs Review Labs Reviewed - No data to display  Imaging Review No results found.   Visual Acuity Review  Right Eye Distance:   Left Eye Distance:   Bilateral Distance:    Right Eye Near:   Left Eye Near:    Bilateral Near:         MDM   1. Blood pressure check   2. Essential hypertension    Pt is advised he needs to follow up his blood pressure. At this time pt has stage 1 HTN. Suggest HCTZ.     Konrad Felix, PA 10/16/15 1258

## 2015-10-17 ENCOUNTER — Ambulatory Visit (HOSPITAL_COMMUNITY)
Admission: EM | Admit: 2015-10-17 | Discharge: 2015-10-17 | Disposition: A | Discharge status: Home / Self Care | Payer: Medicare Other | Attending: Family Medicine | Admitting: Family Medicine

## 2015-10-17 ENCOUNTER — Encounter (HOSPITAL_COMMUNITY): Payer: Self-pay | Admitting: Nurse Practitioner

## 2015-10-17 DIAGNOSIS — Z136 Encounter for screening for cardiovascular disorders: Secondary | ICD-10-CM

## 2015-10-17 DIAGNOSIS — Z0130 Encounter for examination of blood pressure without abnormal findings: Secondary | ICD-10-CM

## 2015-10-17 NOTE — ED Provider Notes (Signed)
CSN: [id number]     Arrival date & time 10/17/15  1821 History   First MD Initiated Contact with Patient 10/17/15 1900     Chief Complaint  Patient presents with  . Hypertension   (Consider location/radiation/quality/duration/timing/severity/associated sxs/prior Treatment) HPI Pt is here for blood pressure check in hopes of returning to [facility]. No other complaints.  Past Medical History  Diagnosis Date  . HIV (human immunodeficiency virus infection) ([place]) 1988  . Chronic anemia     normocytic  . Chronic folliculitis   . Gross hematuria   . Elevated PSA   . H/O pericarditis     2010--  myopercarditis--  resolved  . History of concussion     2012  &  2013  RESIDUAL HA'S --  RESOLVED  . Axillary lymphadenopathy   . History of MRSA infection 2010    infected boil  . History of gastric ulcer   . Lytic bone lesion of hip     WORK-UP DONE BY ONCOLOGIST DR HA --  NOT MALIGNANT  . Wears glasses   . Headache(784.0)     HX SEVERE FRONTAL HA'S  . Ulcer     hx of gastric  . Asthma     very rare  . Arthritis     r knee   . GERD (gastroesophageal reflux disease)   . Bronchitis   . Post concussion syndrome     resolved  . Prostate cancer ([place]) 04/25/13    gleason 3+3=6, volume 30 gm  . Environmental and seasonal allergies   . Pneumonia     hx of  . HCAP (healthcare-associated pneumonia) 11/19/2014  . Kidney stones   . Sinusitis, chronic 05/14/2015   Past Surgical History  Procedure Laterality Date  . Upper leg soft tissue biopsy Left 2012    lthigh  . Knee arthroscopy Right 1985  . Colonoscopy  12/26/2011    Procedure: COLONOSCOPY;  Surgeon: Lear Ng, MD;  Location: [facility];  Service: Endoscopy;  Laterality: N/A;  . Esophagogastroduodenoscopy  12/26/2011    Procedure: ESOPHAGOGASTRODUODENOSCOPY (EGD);  Surgeon: Lear Ng, MD;  Location: [facility];  Service: Endoscopy;  Laterality: N/A;  . Excisional bx left breast mass/  i  &  d left breast abscess   03-24-2009  . Excision chronic left breast abscess  09-21-2010  . Cystoscopy/retrograde/ureteroscopy Bilateral 04/25/2013    Procedure: CYSTOSCOPY/ BILATERAL RETROGRADES; BLADDER BIOPSIES;  Surgeon: Alexis Frock, MD;  Location: [facility];  Service: Urology;  Laterality: Bilateral;  . Prostate biopsy N/A 04/25/2013    Procedure: BIOPSY TRANSRECTAL ULTRASONIC PROSTATE (TUBP);  Surgeon: Alexis Frock, MD;  Location: [facility];  Service: Urology;  Laterality: N/A;  . Left hip biopsy  10/2012  . Left axilla biopsy  04/2013  . Robot assisted laparoscopic radical prostatectomy N/A 08/18/2013    Procedure: ROBOTIC ASSISTED LAPAROSCOPIC RADICAL PROSTATECTOMY;  Surgeon: Alexis Frock, MD;  Location: [facility];  Service: Urology;  Laterality: N/A;  . Lymphadenectomy Bilateral 08/18/2013    Procedure: LYMPHADENECTOMY "PELVIC LYMPH NODE DISSECTION";  Surgeon: Alexis Frock, MD;  Location: [facility];  Service: Urology;  Laterality: Bilateral;  . Esophagogastroduodenoscopy (egd) with propofol N/A 07/29/2014    Procedure: ESOPHAGOGASTRODUODENOSCOPY (EGD) WITH PROPOFOL;  Surgeon: Lear Ng, MD;  Location: [facility];  Service: Endoscopy;  Laterality: N/A;  . Colonoscopy with propofol N/A 07/29/2014    Procedure: COLONOSCOPY WITH PROPOFOL;  Surgeon: Lear Ng, MD;  Location: [facility];  Service: Endoscopy;  Laterality: N/A;  .  Cardiac catheterization  03-23-2009  DR COOPER    NORMAL CORONARY ARTERIES  . Dental examination under anesthesia    . Cholecystectomy N/A 11/06/2014    Procedure: LAPAROSCOPIC CHOLECYSTECTOMY WITH INTRAOPERATIVE CHOLANGIOGRAM;  Surgeon: Judeth Horn, MD;  Location: [facility] OR;  Service: General;  Laterality: N/A;   Family History  Problem Relation Age of Onset  . Stroke Father   . Diabetes Father   . Cancer Father     brain cancer  . Hypertension Sister   . Cancer Maternal Uncle     prostate cancer  . Asthma Sister   . Asthma Father     Social History  Substance Use Topics  . Smoking status: Former Smoker -- 1.50 packs/day for 23 years    Types: Cigars, Cigarettes    Quit date: 05/22/2010  . Smokeless tobacco: Never Used  . Alcohol Use: No    Review of Systems Blood pressure check Allergies  Morphine and related; Penicillins; Sulfonamide derivatives; Sulfa antibiotics; Tramadol; and Tylenol  Home Medications   Prior to Admission medications   Medication Sig Start Date End Date Taking? Authorizing Provider  albuterol (PROVENTIL HFA;VENTOLIN HFA) 108 (90 BASE) MCG/ACT inhaler Inhale 2 puffs into the lungs every 4 (four) hours as needed for wheezing or shortness of breath. 11/11/14   Noemi Chapel, MD  albuterol (PROVENTIL HFA;VENTOLIN HFA) 108 (90 Base) MCG/ACT inhaler Inhale 2 puffs into the lungs every 4 (four) hours as needed for wheezing or shortness of breath. 10/14/15   Konrad Felix, PA  albuterol (PROVENTIL) (2.5 MG/3ML) 0.083% nebulizer solution Take 3 mLs (2.5 mg total) by nebulization every 6 (six) hours as needed for wheezing or shortness of breath. Patient taking differently: Take 2.5 mg by nebulization 3 (three) times daily as needed for wheezing or shortness of breath. Mix with ipratropium 11/11/14   Noemi Chapel, MD  aspirin EC 81 MG tablet Take 81 mg by mouth every Monday, Wednesday, and Friday. At bedtime    Historical Provider, MD  Beclomethasone Dipropionate (QNASL) 80 MCG/ACT AERS Place 2 sprays into both nostrils daily.    Historical Provider, MD  Beclomethasone Dipropionate 80 MCG/ACT AERS Place 2 sprays into the nose once. 10/14/15   Konrad Felix, PA  chlorpheniramine-HYDROcodone (TUSSIONEX) 10-8 MG/5ML SUER Take 5 mLs by mouth every 12 (twelve) hours as needed for cough. 09/15/15   Carlyle Basques, MD  cyanocobalamin (,VITAMIN B-12,) 1000 MCG/ML injection Inject 1 mL (1,000 mcg total) into the muscle once. Patient taking differently: Inject 1,000 mcg into the muscle every 30 (thirty) days. Last  injection beginning of January 07/21/15   Campbell Riches, MD  Darunavir Ethanolate (PREZISTA) 800 MG tablet Take 1 tablet (800 mg total) by mouth daily. Patient taking differently: Take 800 mg by mouth at bedtime.  07/21/15   Campbell Riches, MD  diclofenac sodium (VOLTAREN) 1 % GEL APPLY TO THE AFFECTED AREA TWICE DAILY AS NEEDED Patient taking differently: Apply 1 application topically 2 (two) times daily as needed (pain).  02/05/15   Campbell Riches, MD  diphenhydrAMINE (BENADRYL) 25 MG tablet Take 25 mg by mouth every 6 (six) hours as needed for allergies (congestion).    Historical Provider, MD  emtricitabine-tenofovir AF (DESCOVY) 200-25 MG tablet Take 1 tablet by mouth daily. 09/15/15   Carlyle Basques, MD  ferrous sulfate 325 (65 FE) MG tablet Take 1 tablet (325 mg total) by mouth daily with breakfast. 11/21/14   Langley Gauss Moding, MD  fexofenadine-pseudoephedrine (ALLEGRA-D) 60-120 MG 12 hr  tablet Take 1 tablet by mouth at bedtime.    Historical Provider, MD  gabapentin (NEURONTIN) 300 MG capsule Take 1 capsule (300 mg total) by mouth 4 (four) times daily as needed (nerve pain). 09/07/15   Dennie Bible, NP  guaiFENesin (MUCINEX) 600 MG 12 hr tablet Take 600 mg by mouth 2 (two) times daily as needed for cough or to loosen phlegm.    Historical Provider, MD  hydrochlorothiazide (HYDRODIURIL) 25 MG tablet Take 1 tablet (25 mg total) by mouth daily. 10/14/15   Konrad Felix, PA  hydrOXYzine (ATARAX/VISTARIL) 25 MG tablet TAKE 1 TABLET(25 MG) BY MOUTH EVERY 6 HOURS AS NEEDED FOR ANXIETY Patient taking differently: TAKE 1 TABLET(25 MG) BY MOUTH EVERY 6 HOURS AS NEEDED FOR ITCHING 04/16/15   Campbell Riches, MD  ipratropium (ATROVENT) 0.02 % nebulizer solution Take 2.5 mLs (0.5 mg total) by nebulization 3 (three) times daily as needed for wheezing or shortness of breath. Mix with xopenex Patient taking differently: Take 0.5 mg by nebulization 3 (three) times daily as needed for wheezing or  shortness of breath. Mix with albuterol 02/13/14   Carly J Rivet, MD  mometasone (NASONEX) 50 MCG/ACT nasal spray Place 2 sprays into the nose daily. 03/09/15   Julianne Rice, MD  mometasone-formoterol [facility]) 200-5 MCG/ACT AERO Take 2 puffs first thing in am and then another 2 puffs about 12 hours later. Patient taking differently: Inhale 2 puffs into the lungs every 12 (twelve) hours.  07/22/14   Tanda Rockers, MD  Multiple Vitamin (MULTIVITAMIN WITH MINERALS) TABS tablet Take 1 tablet by mouth at bedtime.    Historical Provider, MD  mupirocin ointment (BACTROBAN) 2 % APPLY TO THE AFFECTED AREA TWICE DAILY FOR 2 WEEKS, PLACE COTTON BETWEEN TOES TO ALLOW BETTER AERATION AS NEEDED 07/28/15   Campbell Riches, MD  NORVIR 100 MG TABS tablet Take 1 tablet (100 mg total) by mouth daily. Patient taking differently: Take 100 mg by mouth at bedtime.  07/21/15   Campbell Riches, MD  omeprazole (PRILOSEC) 20 MG capsule Take 20 mg by mouth at bedtime. 07/27/15   Historical Provider, MD  ondansetron (ZOFRAN) 4 MG tablet Take 1 tablet (4 mg total) by mouth every 8 (eight) hours as needed for nausea or vomiting (ONLY AS NEEDED). 10/04/15   Campbell Riches, MD  Polyvinyl Alcohol-Povidone (REFRESH OP) Place 1 drop into both eyes daily.     Historical Provider, MD  raltegravir (ISENTRESS) 400 MG tablet Take 1 tablet (400 mg total) by mouth 2 (two) times daily. Patient taking differently: Take 800 mg by mouth at bedtime.  07/21/15   Campbell Riches, MD  tizanidine (ZANAFLEX) 6 MG capsule Take 1 capsule (6 mg total) by mouth 3 (three) times daily as needed for muscle spasms. 09/07/15   Dennie Bible, NP  traZODone (DESYREL) 100 MG tablet Take 100 mg by mouth at bedtime as needed for sleep.    Historical Provider, MD  TRUVADA 200-300 MG tablet Take 1 tablet by mouth daily. 10/01/15   Historical Provider, MD   Meds Ordered and Administered this Visit  Medications - No data to display  BP 133/84 mmHg  Pulse 96   Temp(Src) 98.7 F (37.1 C) (Oral)  Resp 16  SpO2 97% No data found.   Physical Exam  NURSES NOTES AND VITAL SIGNS REVIEWED. CONSTITUTIONAL: Well developed, well nourished, no acute distress HEENT: normocephalic, atraumatic, right and left TM's are normal EYES: Conjunctiva normal NECK:normal ROM, supple,  no adenopathy PULMONARY:No respiratory distress, normal effort, Lungs: CTAb/l, no wheezes, or increased work of breathing CARDIOVASCULAR: RRR, no murmur ABDOMEN: soft, ND, NT, +'ve BS MUSCULOSKELETAL: Normal ROM of all extremities,  SKIN: warm and dry without rash PSYCHIATRIC: Mood and affect, behavior are normal    ED Course  Procedures (including critical care time)  Labs Review Labs Reviewed - No data to display  Imaging Review No results found.   Visual Acuity Review  Right Eye Distance:   Left Eye Distance:   Bilateral Distance:    Right Eye Near:   Left Eye Near:    Bilateral Near:       Blood pressure is within a normal range. You should be able to return to work.  MDM   1. Blood pressure check     Patient is reassured that there are no issues that require transfer to higher level of care at this time or additional tests. Patient is advised to continue home symptomatic treatment. Patient is advised that if there are new or worsening symptoms to attend the emergency department, contact primary care provider, or return to UC. Instructions of care provided discharged home in stable condition.    THIS NOTE WAS GENERATED USING A VOICE RECOGNITION SOFTWARE PROGRAM. ALL REASONABLE EFFORTS  WERE MADE TO PROOFREAD THIS DOCUMENT FOR ACCURACY.  I have verbally reviewed the discharge instructions with the patient. A printed AVS was given to the patient.  All questions were answered prior to discharge.      Konrad Felix, [place] 10/17/15 1932

## 2015-10-17 NOTE — ED Notes (Signed)
He states he is here for a BP recheck. He was seen here last week and started on HCTZ for hypertension. He denies any complaints. He is alert and breathing easily.

## 2015-10-17 NOTE — Discharge Instructions (Signed)
Your blood pressure today is 133/84 this is within normal range.   There are no other findings at this time.   You may return to work

## 2015-10-19 ENCOUNTER — Telehealth: Payer: Self-pay | Admitting: *Deleted

## 2015-10-19 NOTE — Telephone Encounter (Signed)
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 A letter was already provided showing two good blood pressures at previous visits, however she said the blood pressure meds are no longer working very well. Patient does have a PCP and Dr. Algis Downs next available appt is 11/03/15. She states he needs an appt prior to 10/25/15. Please advise

## 2015-10-20 NOTE — Telephone Encounter (Signed)
Needs appt with PCP

## 2015-10-20 NOTE — Telephone Encounter (Signed)
Patient notified

## 2015-10-21 ENCOUNTER — Other Ambulatory Visit (HOSPITAL_COMMUNITY): Payer: Self-pay | Admitting: Nephrology

## 2015-10-21 ENCOUNTER — Ambulatory Visit: Payer: Medicare Other | Admitting: Internal Medicine

## 2015-10-21 DIAGNOSIS — N182 Chronic kidney disease, stage 2 (mild): Secondary | ICD-10-CM

## 2015-10-21 DIAGNOSIS — I129 Hypertensive chronic kidney disease with stage 1 through stage 4 chronic kidney disease, or unspecified chronic kidney disease: Secondary | ICD-10-CM | POA: Diagnosis not present

## 2015-10-21 DIAGNOSIS — D631 Anemia in chronic kidney disease: Secondary | ICD-10-CM | POA: Diagnosis not present

## 2015-10-21 DIAGNOSIS — N2581 Secondary hyperparathyroidism of renal origin: Secondary | ICD-10-CM | POA: Diagnosis not present

## 2015-10-22 ENCOUNTER — Telehealth: Payer: Self-pay | Admitting: Internal Medicine

## 2015-10-22 NOTE — Telephone Encounter (Signed)
APPT. REMINDER CALL, LMTCB °

## 2015-10-25 ENCOUNTER — Ambulatory Visit (INDEPENDENT_AMBULATORY_CARE_PROVIDER_SITE_OTHER): Payer: Medicare Other | Admitting: Internal Medicine

## 2015-10-25 ENCOUNTER — Encounter: Payer: Self-pay | Admitting: Internal Medicine

## 2015-10-25 VITALS — BP 136/84 | HR 88 | Temp 98.2°F | Ht 69.0 in | Wt 207.5 lb

## 2015-10-25 DIAGNOSIS — D519 Vitamin B12 deficiency anemia, unspecified: Secondary | ICD-10-CM | POA: Diagnosis not present

## 2015-10-25 DIAGNOSIS — J449 Chronic obstructive pulmonary disease, unspecified: Secondary | ICD-10-CM | POA: Diagnosis not present

## 2015-10-25 DIAGNOSIS — N200 Calculus of kidney: Secondary | ICD-10-CM | POA: Diagnosis not present

## 2015-10-25 DIAGNOSIS — Z8679 Personal history of other diseases of the circulatory system: Secondary | ICD-10-CM | POA: Diagnosis not present

## 2015-10-25 MED ORDER — CYANOCOBALAMIN 1000 MCG/ML IJ SOLN
1000.0000 ug | Freq: Once | INTRAMUSCULAR | Status: AC
  Administered 2015-10-25: 1000 ug via INTRAMUSCULAR

## 2015-10-25 NOTE — Progress Notes (Signed)
Asked to give b12 injection to patient, upon completion of injection, pt's sister asked if we could send in an order to get a new nebulizer machine.  States pt has one, but it is due for a replacement.  Discussed with MD-ok to place order for machine.  Will send order to front office staff for completion..  Pt's sister also stated that insurance will not pay for his Qvar inhaler and has asked for our assistance in obtaining prior authorization.  Will f/u with pt's pharmacy.Regenia Skeeter, Vlad Mayberry Cassady4/24/201711:34 AM

## 2015-10-25 NOTE — Progress Notes (Signed)
Case discussed with Dr. Gill soon after the resident saw the patient.  We reviewed the resident's history and exam and pertinent patient test results.  I agree with the assessment, diagnosis and plan of care documented in the resident's note. 

## 2015-10-25 NOTE — Patient Instructions (Signed)
Thank you for your visit today.   Please return to the internal medicine clinic in about 6 month(s) or sooner if needed.     I have made the following additions/changes to your medications:  Please continue to follow up with Dr. Posey Pronto for your kidney stone and blood pressure.   We will check your b12 level today and give you an injection.   Please be sure to bring all of your medications with you to every visit; this includes herbal supplements, vitamins, eye drops, and any over-the-counter medications.   Should you have any questions regarding your medications and/or any new or worsening symptoms, please be sure to call the clinic at [phone].   If you believe that you are suffering from a life threatening condition or one that may result in the loss of limb or function, then you should call 911 and proceed to the nearest Emergency Department.   A healthy lifestyle and preventative care can promote health and wellness.   Maintain regular health, dental, and eye exams.  Eat a healthy diet. Foods like vegetables, fruits, whole grains, low-fat dairy products, and lean protein foods contain the nutrients you need without too many calories. Decrease your intake of foods high in solid fats, added sugars, and salt. Get information about a proper diet from your caregiver, if necessary.  Regular physical exercise is one of the most important things you can do for your health. Most adults should get at least 150 minutes of moderate-intensity exercise (any activity that increases your heart rate and causes you to sweat) each week. In addition, most adults need muscle-strengthening exercises on 2 or more days a week.   Maintain a healthy weight. The body mass index (BMI) is a screening tool to identify possible weight problems. It provides an estimate of body fat based on height and weight. Your caregiver can help determine your BMI, and can help you achieve or maintain a healthy weight. For  adults 20 years and older:  A BMI below 18.5 is considered underweight.  A BMI of 18.5 to 24.9 is normal.  A BMI of 25 to 29.9 is considered overweight.  A BMI of 30 and above is considered obese.

## 2015-10-25 NOTE — Assessment & Plan Note (Signed)
Pt denies having any breathing difficulty currently.  He is compliant with his medications.  They are requesting a new nebulizer machine. -nebulizer machine DME order

## 2015-10-25 NOTE — Assessment & Plan Note (Addendum)
Pt reports B12 deficiency and requesting an injection.  The last B12 in EPIC was 2014.  They stated he received a B12 injection from Dr. Algis Downs office about 1 yr ago and this always makes him feel better with increased energy.  Last cbc in March with normal MCV and hgb.   Lab Results  Component Value Date   VITAMINB12 386 06/01/2013  -B12 injection today

## 2015-10-25 NOTE — Progress Notes (Signed)
Patient ID: Casey Brennan, male   DOB: [birth year], 55 y.o.   MRN: [id number]     Subjective:   Patient ID: Casey Brennan male    DOB: 1960-12-02 55 y.o.    MRN: [id number] Health Maintenance Due: There are no preventive care reminders to display for this patient.  _________________________________________________  HPI: [name] is a 55 y.o. male here for follow up of HTN and nephrolithiasis.  Pt has a PMH outlined below.  Please see problem-based charting assessment and plan for further status of patient's chronic medical problems addressed at today's visit.  PMH: Past Medical History  Diagnosis Date  . HIV (human immunodeficiency virus infection) ([place]) 1988  . Chronic anemia     normocytic  . Chronic folliculitis   . Gross hematuria   . Elevated PSA   . H/O pericarditis     2010--  myopercarditis--  resolved  . History of concussion     2012  &  2013  RESIDUAL HA'S --  RESOLVED  . Axillary lymphadenopathy   . History of MRSA infection 2010    infected boil  . History of gastric ulcer   . Lytic bone lesion of hip     WORK-UP DONE BY ONCOLOGIST DR HA --  NOT MALIGNANT  . Wears glasses   . Headache(784.0)     HX SEVERE FRONTAL HA'S  . Ulcer     hx of gastric  . Asthma     very rare  . Arthritis     r knee   . GERD (gastroesophageal reflux disease)   . Bronchitis   . Post concussion syndrome     resolved  . Prostate cancer ([place]) 04/25/13    gleason 3+3=6, volume 30 gm  . Environmental and seasonal allergies   . Pneumonia     hx of  . HCAP (healthcare-associated pneumonia) 11/19/2014  . Kidney stones   . Sinusitis, chronic 05/14/2015    Medications: Current Outpatient Prescriptions on File Prior to Visit  Medication Sig Dispense Refill  . albuterol (PROVENTIL HFA;VENTOLIN HFA) 108 (90 Base) MCG/ACT inhaler Inhale 2 puffs into the lungs every 4 (four) hours as needed for wheezing or shortness of breath. 2 Inhaler 3  . albuterol (PROVENTIL) (2.5  MG/3ML) 0.083% nebulizer solution Take 3 mLs (2.5 mg total) by nebulization every 6 (six) hours as needed for wheezing or shortness of breath. (Patient taking differently: Take 2.5 mg by nebulization 3 (three) times daily as needed for wheezing or shortness of breath. Mix with ipratropium) 75 mL 12  . aspirin EC 81 MG tablet Take 81 mg by mouth every Monday, Wednesday, and Friday. At bedtime    . Beclomethasone Dipropionate 80 MCG/ACT AERS Place 2 sprays into the nose once. 1 Inhaler 2  . Darunavir Ethanolate (PREZISTA) 800 MG tablet Take 1 tablet (800 mg total) by mouth daily. (Patient taking differently: Take 800 mg by mouth at bedtime. ) 90 tablet 3  . diclofenac sodium (VOLTAREN) 1 % GEL APPLY TO THE AFFECTED AREA TWICE DAILY AS NEEDED (Patient taking differently: Apply 1 application topically 2 (two) times daily as needed (pain). ) 100 g 3  . fexofenadine-pseudoephedrine (ALLEGRA-D) 60-120 MG 12 hr tablet Take 1 tablet by mouth at bedtime.    . gabapentin (NEURONTIN) 300 MG capsule Take 1 capsule (300 mg total) by mouth 4 (four) times daily as needed (nerve pain). 120 capsule 11  . hydrochlorothiazide (HYDRODIURIL) 25 MG tablet Take 1 tablet (25 mg total) by  mouth daily. 30 tablet 0  . hydrOXYzine (ATARAX/VISTARIL) 25 MG tablet TAKE 1 TABLET(25 MG) BY MOUTH EVERY 6 HOURS AS NEEDED FOR ANXIETY (Patient taking differently: TAKE 1 TABLET(25 MG) BY MOUTH EVERY 6 HOURS AS NEEDED FOR ITCHING) 30 tablet 1  . ipratropium (ATROVENT) 0.02 % nebulizer solution Take 2.5 mLs (0.5 mg total) by nebulization 3 (three) times daily as needed for wheezing or shortness of breath. Mix with xopenex (Patient taking differently: Take 0.5 mg by nebulization 3 (three) times daily as needed for wheezing or shortness of breath. Mix with albuterol) 75 mL 1  . mometasone-formoterol (DULERA) 200-5 MCG/ACT AERO Take 2 puffs first thing in am and then another 2 puffs about 12 hours later. (Patient taking differently: Inhale 2 puffs  into the lungs every 12 (twelve) hours. ) 1 Inhaler 11  . Multiple Vitamin (MULTIVITAMIN WITH MINERALS) TABS tablet Take 1 tablet by mouth at bedtime.    . mupirocin ointment (BACTROBAN) 2 % APPLY TO THE AFFECTED AREA TWICE DAILY FOR 2 WEEKS, PLACE COTTON BETWEEN TOES TO ALLOW BETTER AERATION AS NEEDED 66 g 0  . NORVIR 100 MG TABS tablet Take 1 tablet (100 mg total) by mouth daily. (Patient taking differently: Take 100 mg by mouth at bedtime. ) 360 tablet 3  . omeprazole (PRILOSEC) 20 MG capsule Take 20 mg by mouth at bedtime.    . ondansetron (ZOFRAN) 4 MG tablet Take 1 tablet (4 mg total) by mouth every 8 (eight) hours as needed for nausea or vomiting (ONLY AS NEEDED). 30 tablet 1  . Polyvinyl Alcohol-Povidone (REFRESH OP) Place 1 drop into both eyes daily.     . raltegravir (ISENTRESS) 400 MG tablet Take 1 tablet (400 mg total) by mouth 2 (two) times daily. (Patient taking differently: Take 800 mg by mouth at bedtime. ) 120 tablet 3  . tizanidine (ZANAFLEX) 6 MG capsule Take 1 capsule (6 mg total) by mouth 3 (three) times daily as needed for muscle spasms. 270 capsule 3  . traZODone (DESYREL) 100 MG tablet Take 100 mg by mouth at bedtime as needed for sleep.    . TRUVADA 200-300 MG tablet Take 1 tablet by mouth daily.     No current facility-administered medications on file prior to visit.    Allergies: Allergies  Allergen Reactions  . Morphine And Related Shortness Of Breath, Itching and Other (See Comments)     chest pain  . Penicillins Shortness Of Breath and Rash    Has patient had a PCN reaction causing immediate rash, facial/tongue/throat swelling, SOB or lightheadedness with hypotension: Yes Has patient had a PCN reaction causing severe rash involving mucus membranes or skin necrosis: No Has patient had a PCN reaction that required hospitalization No Has patient had a PCN reaction occurring within the last 10 years: Yes If all of the above answers are "NO", then may proceed with  Cephalosporin use.  . Sulfonamide Derivatives Shortness Of Breath and Rash  . Sulfa Antibiotics   . Tramadol Itching and Nausea And Vomiting  . Tylenol [Acetaminophen] Rash    FH: Family History  Problem Relation Age of Onset  . Stroke Father   . Diabetes Father   . Cancer Father     brain cancer  . Hypertension Sister   . Cancer Maternal Uncle     prostate cancer  . Asthma Sister   . Asthma Father     SH: Social History   Social History  . Marital Status: Divorced  Spouse Name: N/A  . Number of Children: 1  . Years of Education: College   Occupational History  . truck driver     past  . disability    Social History Main Topics  . Smoking status: Former Smoker -- 1.50 packs/day for 23 years    Types: Cigars, Cigarettes    Quit date: 05/22/2010  . Smokeless tobacco: Never Used  . Alcohol Use: No  . Drug Use: No  . Sexual Activity: Not Currently    Birth Control/ Protection: Condom     Comment: pt. declined condoms   Other Topics Concern  . None   Social History Narrative   Sister April stays with him currently although he hopes to return to truck driving. 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 Sister is starting medical school next year.   Patient is divorced and lives with his sister.   Patient has one adult son.   Patient is part-time, driving a truck.   Patient has a college education.   Patient is right-handed.   Patient drinks 2-3 cups of coffee and a 4-5 cups of tea.    Review of Systems: Constitutional: Negative for fever, chills.  Eyes: Negative for blurred vision.  Respiratory: Negative for cough and shortness of breath.  Cardiovascular: Negative for chest pain.  Gastrointestinal: +nausea , neg vomiting. Neurological: Negative for dizziness.   Objective:   Vital Signs: Filed Vitals:   10/25/15 0854  BP: 136/84  Pulse: 88  Temp: 98.2 F (36.8 C)  TempSrc: Oral  Height: 5\' 9"  (1.753 m)  Weight: 207 lb 8 oz (94.121 kg)  SpO2:  100%      BP Readings from Last 3 Encounters:  10/25/15 136/84  10/17/15 133/84  10/14/15 144/93    Physical Exam: Constitutional: Vital signs reviewed.  Patient is in NAD and cooperative with exam.  Head: Normocephalic and atraumatic. Eyes: EOMI, conjunctivae nl, no scleral icterus.  Neck: Supple. Cardiovascular: RRR, no MRG. Pulmonary/Chest: normal effort, CTAB, no wheezes, rales, or rhonchi. Abdominal: Soft, ND, +BS.  Left flank tenderness.   Neurological: A&O x3, cranial nerves II-XII are grossly intact, moving all extremities. Extremities: No LE edema. Skin: Warm, dry and intact.    Assessment & Plan:   Assessment and plan was discussed and formulated with my attending.

## 2015-10-25 NOTE — Assessment & Plan Note (Addendum)
Pt reports having a current kidney stone and is being seen for this by Dr. Elmarie Shiley.  He has an [place] pending for tomorrow.  Denies fever/chills, but endorses some left flank pain and nausea.  Truvada may also cause kidney stones but this will need to be explored by Dr. [name].  They are also requesting a return to work note. -f/u with Dr. Elmarie Shiley -consider Truvada as possible etiology? -return to work note provided for 4/24-4/30

## 2015-10-25 NOTE — Assessment & Plan Note (Signed)
Pt reports being told to come to the ED because he BP was elevated at 105/169?  He was started on HCTZ 25mg  but it was thought his BP was elevated due to pain from a recent kidney stone.  He is followed by Dr. Posey Pronto (nephrology) and will see him tomorrow to f/u.  However, Dr. Posey Pronto wanted him to remain on HCTZ until further notice.  BP today 136/84.   -cont HCTZ 25mg  qd -f/u with Dr. Elmarie Shiley

## 2015-10-26 ENCOUNTER — Ambulatory Visit (HOSPITAL_COMMUNITY): Payer: Medicare Other

## 2015-10-26 ENCOUNTER — Ambulatory Visit (HOSPITAL_COMMUNITY)
Admission: RE | Admit: 2015-10-26 | Discharge: 2015-10-26 | Disposition: A | Discharge status: Home / Self Care | Payer: Medicare Other | Source: Ambulatory Visit | Attending: Nephrology | Admitting: Nephrology

## 2015-10-26 DIAGNOSIS — D631 Anemia in chronic kidney disease: Secondary | ICD-10-CM | POA: Diagnosis not present

## 2015-10-26 DIAGNOSIS — N182 Chronic kidney disease, stage 2 (mild): Secondary | ICD-10-CM | POA: Insufficient documentation

## 2015-10-26 DIAGNOSIS — I129 Hypertensive chronic kidney disease with stage 1 through stage 4 chronic kidney disease, or unspecified chronic kidney disease: Secondary | ICD-10-CM | POA: Diagnosis not present

## 2015-10-26 DIAGNOSIS — N2581 Secondary hyperparathyroidism of renal origin: Secondary | ICD-10-CM | POA: Diagnosis not present

## 2015-10-26 DIAGNOSIS — N281 Cyst of kidney, acquired: Secondary | ICD-10-CM | POA: Diagnosis not present

## 2015-10-26 DIAGNOSIS — N189 Chronic kidney disease, unspecified: Secondary | ICD-10-CM | POA: Diagnosis not present

## 2015-10-26 LAB — VITAMIN B12: Vitamin B-12: >2000 pg/mL — ABNORMAL HIGH (ref 211–946)

## 2015-10-29 ENCOUNTER — Other Ambulatory Visit: Payer: Self-pay | Admitting: Internal Medicine

## 2015-11-03 ENCOUNTER — Telehealth: Payer: Self-pay | Admitting: *Deleted

## 2015-11-03 NOTE — Telephone Encounter (Signed)
I have never prescribed him Trazodone, only Ambien. I would not refill this.

## 2015-11-03 NOTE — Telephone Encounter (Signed)
Trazadone 50mg . Requested via fax. However, Trazadone 100mg . Is documented in chart.

## 2015-11-05 ENCOUNTER — Encounter: Payer: Self-pay | Admitting: *Deleted

## 2015-11-09 ENCOUNTER — Ambulatory Visit: Payer: Medicare Other | Admitting: Internal Medicine

## 2015-11-09 ENCOUNTER — Other Ambulatory Visit: Payer: Self-pay | Admitting: *Deleted

## 2015-11-09 NOTE — Telephone Encounter (Signed)
I have never prescribed this to him. He needs to come in sooner for evaluation for sleep if he wants this.

## 2015-11-09 NOTE — Telephone Encounter (Signed)
Next appointment 04/04/2016.

## 2015-11-11 ENCOUNTER — Other Ambulatory Visit: Payer: Self-pay | Admitting: Infectious Diseases

## 2015-12-06 ENCOUNTER — Other Ambulatory Visit: Payer: Medicare Other

## 2015-12-18 ENCOUNTER — Other Ambulatory Visit: Payer: Self-pay | Admitting: Infectious Diseases

## 2015-12-20 ENCOUNTER — Ambulatory Visit: Payer: Medicare Other | Admitting: Infectious Diseases

## 2015-12-20 NOTE — Telephone Encounter (Signed)
Pharmacy called to verify if the patient is really getting #60 Zofran a month.

## 2016-01-11 ENCOUNTER — Encounter: Payer: Self-pay | Admitting: Internal Medicine

## 2016-01-11 ENCOUNTER — Other Ambulatory Visit: Payer: Self-pay | Admitting: Internal Medicine

## 2016-01-11 ENCOUNTER — Ambulatory Visit (INDEPENDENT_AMBULATORY_CARE_PROVIDER_SITE_OTHER): Payer: Medicare Other | Admitting: Internal Medicine

## 2016-01-11 VITALS — BP 123/86 | HR 92 | Temp 98.6°F | Ht 69.0 in | Wt 214.7 lb

## 2016-01-11 DIAGNOSIS — M1731 Unilateral post-traumatic osteoarthritis, right knee: Secondary | ICD-10-CM | POA: Diagnosis not present

## 2016-01-11 DIAGNOSIS — M1711 Unilateral primary osteoarthritis, right knee: Secondary | ICD-10-CM

## 2016-01-11 DIAGNOSIS — B20 Human immunodeficiency virus [HIV] disease: Secondary | ICD-10-CM

## 2016-01-11 DIAGNOSIS — Z21 Asymptomatic human immunodeficiency virus [HIV] infection status: Secondary | ICD-10-CM | POA: Diagnosis not present

## 2016-01-11 MED ORDER — IPRATROPIUM BROMIDE 0.02 % IN SOLN: 0.5000 mg | Freq: Three times a day (TID) | RESPIRATORY_TRACT | Status: DC | PRN

## 2016-01-11 MED ORDER — ALBUTEROL SULFATE HFA 108 (90 BASE) MCG/ACT IN AERS: 2.0000 | INHALATION_SPRAY | RESPIRATORY_TRACT | Status: DC | PRN

## 2016-01-11 MED ORDER — CYANOCOBALAMIN 1000 MCG/ML IJ SOLN
1000.0000 ug | Freq: Once | INTRAMUSCULAR | Status: AC
  Administered 2016-01-11: 1000 ug via INTRAMUSCULAR

## 2016-01-11 MED ORDER — CAPSAICIN-MENTHOL-METHYL SAL 0.025-1-12 % EX CREA: TOPICAL_CREAM | CUTANEOUS | Status: DC

## 2016-01-11 MED ORDER — MELOXICAM 7.5 MG PO TABS: 7.5000 mg | ORAL_TABLET | Freq: Every day | ORAL | Status: DC

## 2016-01-11 MED ORDER — ALBUTEROL SULFATE (2.5 MG/3ML) 0.083% IN NEBU: 2.5000 mg | INHALATION_SOLUTION | Freq: Four times a day (QID) | RESPIRATORY_TRACT | Status: DC | PRN

## 2016-01-11 NOTE — Patient Instructions (Addendum)
We will start a new medication called Meloxicam for your knee pain. You should take this medication every day to help control your symptoms. You should make sure to take your acid reducer (Omeprazole) everyday, in order to avoid any side-effects in the stomach.  We have also prescribed a new creme with capsaicin that you can apply topically to your knee in alternation with the Voltaren gel.  We will send a referral to Physical therapy in order to help you get a new knee brace to to identify some exercises that may help. We will also send a new referral to see an Orthopedic surgeon.

## 2016-01-11 NOTE — Progress Notes (Signed)
CC: knee pain HPI: [name] is a 55 y.o. male with a h/o of HIV, HTN, right knee osteoarthritis who presents with knee pain.  See problem based charting for HPI.  Past Medical History  Diagnosis Date  . HIV (human immunodeficiency virus infection) ([place]) 1988  . Chronic anemia     normocytic  . Chronic folliculitis   . Gross hematuria   . Elevated PSA   . H/O pericarditis     2010--  myopercarditis--  resolved  . History of concussion     2012  &  2013  RESIDUAL HA'S --  RESOLVED  . Axillary lymphadenopathy   . History of MRSA infection 2010    infected boil  . History of gastric ulcer   . Lytic bone lesion of hip     WORK-UP DONE BY ONCOLOGIST DR HA --  NOT MALIGNANT  . Wears glasses   . Headache(784.0)     HX SEVERE FRONTAL HA'S  . Ulcer     hx of gastric  . Asthma     very rare  . Arthritis     r knee   . GERD (gastroesophageal reflux disease)   . Bronchitis   . Post concussion syndrome     resolved  . Prostate cancer ([place]) 04/25/13    gleason 3+3=6, volume 30 gm  . Environmental and seasonal allergies   . Pneumonia     hx of  . HCAP (healthcare-associated pneumonia) 11/19/2014  . Kidney stones   . Sinusitis, chronic 05/14/2015   Current Outpatient Rx  Name  Route  Sig  Dispense  Refill  . albuterol (PROVENTIL HFA;VENTOLIN HFA) 108 (90 Base) MCG/ACT inhaler   Inhalation   Inhale 2 puffs into the lungs every 4 (four) hours as needed for wheezing or shortness of breath.   2 Inhaler   3   . albuterol (PROVENTIL) (2.5 MG/3ML) 0.083% nebulizer solution   Nebulization   Take 3 mLs (2.5 mg total) by nebulization every 6 (six) hours as needed for wheezing or shortness of breath.   75 mL   12   . aspirin EC 81 MG tablet   Oral   Take 81 mg by mouth every Monday, Wednesday, and Friday. At bedtime         . Beclomethasone Dipropionate 80 MCG/ACT AERS   Nasal   Place 2 sprays into the nose once.   1 Inhaler   2   . Darunavir Ethanolate  (PREZISTA) 800 MG tablet   Oral   Take 1 tablet (800 mg total) by mouth daily. Patient taking differently: Take 800 mg by mouth at bedtime.    90 tablet   3   . diclofenac sodium (VOLTAREN) 1 % GEL      APPLY TO THE AFFECTED AREA TWICE DAILY AS NEEDED Patient taking differently: Apply 1 application topically 2 (two) times daily as needed (pain).    100 g   3   . fexofenadine-pseudoephedrine (ALLEGRA-D) 60-120 MG 12 hr tablet   Oral   Take 1 tablet by mouth at bedtime.         . gabapentin (NEURONTIN) 300 MG capsule   Oral   Take 1 capsule (300 mg total) by mouth 4 (four) times daily as needed (nerve pain).   120 capsule   11   . hydrochlorothiazide (HYDRODIURIL) 25 MG tablet   Oral   Take 1 tablet (25 mg total) by mouth daily.   30 tablet  0   . hydrOXYzine (ATARAX/VISTARIL) 25 MG tablet      TAKE 1 TABLET(25 MG) BY MOUTH EVERY 6 HOURS AS NEEDED FOR ANXIETY   30 tablet   0   . ipratropium (ATROVENT) 0.02 % nebulizer solution   Nebulization   Take 2.5 mLs (0.5 mg total) by nebulization 3 (three) times daily as needed for wheezing or shortness of breath.   75 mL   1   . mometasone-formoterol (DULERA) 200-5 MCG/ACT AERO      Take 2 puffs first thing in am and then another 2 puffs about 12 hours later. Patient taking differently: Inhale 2 puffs into the lungs every 12 (twelve) hours.    1 Inhaler   11   . Multiple Vitamin (MULTIVITAMIN WITH MINERALS) TABS tablet   Oral   Take 1 tablet by mouth at bedtime.         . mupirocin ointment (BACTROBAN) 2 %      APPLY TO THE AFFECTED AREA TWICE DAILY FOR 2 WEEKS, PLACE COTTON BETWEEN TOES TO ALLOW BETTER AERATION AS NEEDED   66 g   0   . NORVIR 100 MG TABS tablet   Oral   Take 1 tablet (100 mg total) by mouth daily. Patient taking differently: Take 100 mg by mouth at bedtime.    360 tablet   3     Dispense as written.   [name] omeprazole (PRILOSEC) 20 MG capsule   Oral   Take 20 mg by mouth at bedtime.          . ondansetron (ZOFRAN) 4 MG tablet      TAKE 1 TABLET BY MOUTH EVERY 8 HOURS AS NEEDED FOR NAUSEA OR VOMITING   60 tablet   0   . Polyvinyl Alcohol-Povidone (REFRESH OP)   Both Eyes   Place 1 drop into both eyes daily.          . raltegravir (ISENTRESS) 400 MG tablet   Oral   Take 1 tablet (400 mg total) by mouth 2 (two) times daily. Patient taking differently: Take 800 mg by mouth at bedtime.    120 tablet   3   . tizanidine (ZANAFLEX) 6 MG capsule   Oral   Take 1 capsule (6 mg total) by mouth 3 (three) times daily as needed for muscle spasms.   270 capsule   3     **Patient requests 90 days supply**   . traZODone (DESYREL) 100 MG tablet   Oral   Take 100 mg by mouth at bedtime as needed for sleep.         . TRUVADA 200-300 MG tablet   Oral   Take 1 tablet by mouth daily.           Dispense as written.   . Capsaicin-Menthol-Methyl Sal (CAPSAICIN-METHYL SAL-MENTHOL) 0.025-1-12 % CREA      Apply to knee liberally as needed for Pain   1 Tube   0   . meloxicam (MOBIC) 7.5 MG tablet   Oral   Take 1 tablet (7.5 mg total) by mouth daily.   30 tablet   2    Review of Systems: A complete ROS was negative except as per HPI.  Physical Exam: Filed Vitals:   01/11/16 0842  BP: 123/86  Pulse: 92  Temp: 98.6 F (37 C)  TempSrc: Oral  Height: 5\' 9"  (1.753 m)  Weight: 214 lb 11.2 oz (97.387 kg)  SpO2: 96%   General appearance: alert, cooperative, appears  stated age and no distress Lungs: normal work of breathing Extremities: extremities normal, atraumatic, no cyanosis or edema  MSK: right knee TTP on medial aspect, mild TTP over lateral aspect; left knee w/o TTP. ROM full w/o obvious crepitus; strength intact  Assessment & Plan:  See encounters tab for problem based medical decision making. Patient seen with Dr. Daryll Drown  Signed: Holley Raring, MD 01/11/2016, 10:40 AM  Pager: [phone]

## 2016-01-11 NOTE — Progress Notes (Signed)
PROCEDURE NOTE  PROCEDURE: right knee joint steroid injection.  PREOPERATIVE DIAGNOSIS: Osteoarthritis of the right knee.  POSTOPERATIVE DIAGNOSIS: Osteoarthritis of the right knee.  PROCEDURE: The patient was apprised of the risks and the benefits of the procedure and informed consent was obtained, as witnessed by Dr. Gilles Chiquito. Time-out procedure was performed, with confirmation of the patient's name, date of birth, and correct identification of the right knee to be injected. The patient's knee was then marked at the appropriate site for injection placement. The knee was sterilely prepped with Betadine. A 40 mg (1 milliliter) solution of Kenalog was drawn up into a 10 mL syringe with a 1 mL of 1% lidocaine. The patient was injected with a 25-gauge needle at the lateral aspect of his right flexed knee. There were no complications. The patient tolerated the procedure well. There was minimal bleeding. The patient was instructed to ice his knee upon leaving clinic and refrain from overuse over the next 3 days. The patient was instructed to go to the emergency room with any usual pain, swelling, or redness occurred in the injected area. The patient was given a followup appointment to evaluate response to the injection to his increased range of motion and reduction of pain.  The procedure was supervised by attending physician, Dr. Gilles Chiquito.

## 2016-01-11 NOTE — Assessment & Plan Note (Signed)
Receive Vitamin B12 injection today. Currently stable on med regimen. Follows with ID next appt in 1 month.

## 2016-01-11 NOTE — Assessment & Plan Note (Signed)
Pt has complaints of significant right knee pain since his surgery in 1985. He has 2015 MRI which showed tricompartmental disease and significant ligamentous damage most severe medially. His sx are currently tx with PRN Aleve and Voltaren gel. He has an elastic knee brace which he wears, but does not bring much relief. He has tried steroid injections in the past with good temporary relief, most recently last Summer. He denies an weakness of the muscles or paraesthesias, but does not occasionally swelling of bilateral LE. He occasionally endorses instability of the knee w/ locking episodes.  His allergy profile limits treatment and the severity of disease point toward definitive treatment with surgery. He is unhappy with current orthopaedic care and we will refer to another provider. Today we will manage pain with daily low dose meloxicam 7.5mg  and monitor renal function closely for concerns of interactions with Truvada. We will also supplement topical Voltaren gel with topical capsaicin. He received steroid injection today.

## 2016-01-13 NOTE — Progress Notes (Signed)
Internal Medicine Clinic Attending  I saw and evaluated the patient.  I personally confirmed the key portions of the history and exam documented by Dr. Gay Filler and I reviewed pertinent patient test results.  The assessment, diagnosis, and plan were formulated together and I agree with the documentation in the resident's note.  I was present for the entire procedure, knee injection.

## 2016-01-28 ENCOUNTER — Telehealth: Payer: Self-pay | Admitting: *Deleted

## 2016-01-28 NOTE — Telephone Encounter (Signed)
Patient's sister was called to reschedule Dr. Algis Downs appt in August until his next available In October. She was not happy with this because she said he is having issues and needed to be seen in August. She said he is having leg swelling and night sweats. Asked if it could be addressed by his PCP and she said he was already seen and they felt it could be an issue with his Hiv meds and suggested he f/u with  Dr. [name]. Offered appt with another provider for August and she said he would not be in town on the dates offered. She will call back after speaking to Aldie.

## 2016-02-14 ENCOUNTER — Ambulatory Visit: Payer: Medicare Other | Admitting: Infectious Diseases

## 2016-02-21 ENCOUNTER — Ambulatory Visit: Payer: Medicare Other | Attending: Internal Medicine | Admitting: Physical Therapy

## 2016-02-21 ENCOUNTER — Other Ambulatory Visit: Payer: Self-pay | Admitting: Infectious Diseases

## 2016-02-21 DIAGNOSIS — H16403 Unspecified corneal neovascularization, bilateral: Secondary | ICD-10-CM | POA: Diagnosis not present

## 2016-02-21 DIAGNOSIS — H5213 Myopia, bilateral: Secondary | ICD-10-CM | POA: Diagnosis not present

## 2016-02-21 DIAGNOSIS — H43391 Other vitreous opacities, right eye: Secondary | ICD-10-CM | POA: Diagnosis not present

## 2016-02-21 DIAGNOSIS — H04123 Dry eye syndrome of bilateral lacrimal glands: Secondary | ICD-10-CM | POA: Diagnosis not present

## 2016-02-21 DIAGNOSIS — H2513 Age-related nuclear cataract, bilateral: Secondary | ICD-10-CM | POA: Diagnosis not present

## 2016-02-21 DIAGNOSIS — H524 Presbyopia: Secondary | ICD-10-CM | POA: Diagnosis not present

## 2016-02-21 DIAGNOSIS — M25569 Pain in unspecified knee: Secondary | ICD-10-CM

## 2016-02-21 DIAGNOSIS — H52223 Regular astigmatism, bilateral: Secondary | ICD-10-CM | POA: Diagnosis not present

## 2016-02-21 NOTE — Addendum Note (Signed)
Addended by: Yvonna Alanis E on: 02/21/2016 08:00 PM   Modules accepted: Orders

## 2016-02-22 ENCOUNTER — Ambulatory Visit (INDEPENDENT_AMBULATORY_CARE_PROVIDER_SITE_OTHER): Payer: Medicare Other | Admitting: Internal Medicine

## 2016-02-22 ENCOUNTER — Encounter: Payer: Self-pay | Admitting: Internal Medicine

## 2016-02-22 VITALS — BP 117/75 | HR 85 | Temp 98.5°F | Ht 69.0 in | Wt 205.1 lb

## 2016-02-22 DIAGNOSIS — M545 Low back pain, unspecified: Secondary | ICD-10-CM | POA: Insufficient documentation

## 2016-02-22 DIAGNOSIS — N200 Calculus of kidney: Secondary | ICD-10-CM | POA: Diagnosis not present

## 2016-02-22 DIAGNOSIS — G43909 Migraine, unspecified, not intractable, without status migrainosus: Secondary | ICD-10-CM | POA: Insufficient documentation

## 2016-02-22 DIAGNOSIS — Z87891 Personal history of nicotine dependence: Secondary | ICD-10-CM | POA: Diagnosis not present

## 2016-02-22 DIAGNOSIS — G43009 Migraine without aura, not intractable, without status migrainosus: Secondary | ICD-10-CM

## 2016-02-22 DIAGNOSIS — Z21 Asymptomatic human immunodeficiency virus [HIV] infection status: Secondary | ICD-10-CM | POA: Diagnosis not present

## 2016-02-22 DIAGNOSIS — M1731 Unilateral post-traumatic osteoarthritis, right knee: Secondary | ICD-10-CM

## 2016-02-22 DIAGNOSIS — R51 Headache: Secondary | ICD-10-CM | POA: Diagnosis not present

## 2016-02-22 DIAGNOSIS — M1711 Unilateral primary osteoarthritis, right knee: Secondary | ICD-10-CM

## 2016-02-22 DIAGNOSIS — L988 Other specified disorders of the skin and subcutaneous tissue: Secondary | ICD-10-CM

## 2016-02-22 DIAGNOSIS — Z79899 Other long term (current) drug therapy: Secondary | ICD-10-CM | POA: Diagnosis not present

## 2016-02-22 DIAGNOSIS — J449 Chronic obstructive pulmonary disease, unspecified: Secondary | ICD-10-CM | POA: Diagnosis not present

## 2016-02-22 DIAGNOSIS — L989 Disorder of the skin and subcutaneous tissue, unspecified: Secondary | ICD-10-CM | POA: Insufficient documentation

## 2016-02-22 DIAGNOSIS — B20 Human immunodeficiency virus [HIV] disease: Secondary | ICD-10-CM

## 2016-02-22 MED ORDER — ONDANSETRON HCL 4 MG PO TABS: 4.0000 mg | ORAL_TABLET | Freq: Three times a day (TID) | ORAL | 0 refills | Status: DC | PRN

## 2016-02-22 MED ORDER — FUROSEMIDE 40 MG PO TABS: 20.0000 mg | ORAL_TABLET | Freq: Every day | ORAL | 2 refills | Status: DC

## 2016-02-22 MED ORDER — SUMATRIPTAN SUCCINATE 50 MG PO TABS: 50.0000 mg | ORAL_TABLET | ORAL | 0 refills | Status: DC | PRN

## 2016-02-22 MED ORDER — FUROSEMIDE 40 MG PO TABS: 40.0000 mg | ORAL_TABLET | Freq: Every day | ORAL | 2 refills | Status: DC

## 2016-02-22 NOTE — Patient Instructions (Signed)
General Instructions: - Continue Meloxicam daily for your knee - Referral to physical therapy placed - Imitrex 50 mg as needed for headache. Can take another dose 2 hours later if migraine does not resolve  Please bring your medicines with you each time you come to clinic.  Medicines may include prescription medications, over-the-counter medications, herbal remedies, eye drops, vitamins, or other pills.   Progress Toward Treatment Goals:  Treatment Goal 10/23/2014  Blood pressure at goal    Self Care Goals & Plans:  Self Care Goal 01/18/2015  Manage my medications take my medicines as prescribed; bring my medications to every visit; refill my medications on time  Eat healthy foods eat more vegetables; eat foods that are low in salt; eat baked foods instead of fried foods; drink diet soda or water instead of juice or soda    No flowsheet data found.   Care Management & Community Referrals:  No flowsheet data found.

## 2016-02-22 NOTE — Assessment & Plan Note (Signed)
Last CD4 count 450 and VL undetectable. I advised him to get in with Dr. [name] since he has not been seen since January. He is working on getting an appointment. Continue medications per ID. Appreciate Dr. Algis Downs excellent care.

## 2016-02-22 NOTE — Assessment & Plan Note (Signed)
His headaches do seem to be consistent with migraine and he has been on Imitrex previously. No hx CVA or CAD. Will start Imitrex 50 mg as needed. Advised patient he can take another pill 2 hours later if the migraine has not resolved.
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
## 2016-02-22 NOTE — Assessment & Plan Note (Signed)
Stable. Continue Dulera and Albuterol PRN.

## 2016-02-22 NOTE — Assessment & Plan Note (Signed)
He has several hyperpigmented lesions on his right arm and right shoulder as well as numerous lesions on his abdomen that have apparently been there for quite some time. Since he has not had a response to hydrocortisone or mupirocin, can consider trying a topical antifungal. The lesions do not seem to bother him much and his sister who was present during the visit states they have been trying "natural remedies" with olive oil and eucalyptus with some improvement. I do not see any harm in using these products. Will follow up at next visit.

## 2016-02-22 NOTE — Assessment & Plan Note (Signed)
He did not get any relief from the steroid injection. Meloxicam only seems to provide minimal relief as well. Both the patient and his sister were pushing for him to start opioids but I do not think we have reached that point yet. He is able to perform all his ADLs and social activities. He describes that he is "able to push through the pain." I advised for him to continue Meloxicam daily and will have him start physical therapy. He is going to see his orthopedist, Dr. Percell Miller, tomorrow and I advised for him to ask about any further interventions that are needed.

## 2016-02-22 NOTE — Assessment & Plan Note (Signed)
Seems musculoskeletal in nature. He had a lumbar MRI in 2014 which showed mild degenerative changes at L3-L4. Patient was concerned his pain could be related to kidney stones but I doubt this as his pain was bilateral. He had a recent renal US in April this year that did not show any stones. Will check a UA today to make sure no infection, but I doubt this as well. Advised to continue Meloxicam.

## 2016-02-23 LAB — MICROSCOPIC EXAMINATION: Casts: NONE SEEN /lpf

## 2016-02-23 LAB — URINALYSIS, ROUTINE W REFLEX MICROSCOPIC
Bilirubin, UA: NEGATIVE
Glucose, UA: NEGATIVE
Ketones, UA: NEGATIVE
Nitrite, UA: NEGATIVE
Protein, UA: NEGATIVE
RBC, UA: NEGATIVE
Specific Gravity, UA: 1.014 (ref 1.005–1.030)
Urobilinogen, Ur: 0.2 mg/dL (ref 0.2–1.0)
pH, UA: 5 (ref 5.0–7.5)

## 2016-02-25 NOTE — Progress Notes (Signed)
Internal Medicine Clinic Attending  Case discussed with Dr. Rivet soon after the resident saw the patient.  We reviewed the resident's history and exam and pertinent patient test results.  I agree with the assessment, diagnosis, and plan of care documented in the resident's note.  

## 2016-03-10 NOTE — Addendum Note (Signed)
Addended by: Truddie Crumble on: 03/10/2016 11:04 AM   Modules accepted: Orders

## 2016-03-13 ENCOUNTER — Other Ambulatory Visit: Payer: Self-pay | Admitting: Internal Medicine

## 2016-03-13 DIAGNOSIS — G43009 Migraine without aura, not intractable, without status migrainosus: Secondary | ICD-10-CM

## 2016-03-14 ENCOUNTER — Encounter: Payer: Medicare Other | Admitting: Internal Medicine

## 2016-03-20 ENCOUNTER — Other Ambulatory Visit: Payer: Self-pay | Admitting: Internal Medicine

## 2016-03-20 DIAGNOSIS — G43009 Migraine without aura, not intractable, without status migrainosus: Secondary | ICD-10-CM

## 2016-03-20 MED ORDER — SUMATRIPTAN SUCCINATE 50 MG PO TABS: 50.0000 mg | ORAL_TABLET | ORAL | 0 refills | Status: DC | PRN

## 2016-03-20 NOTE — Telephone Encounter (Signed)
Imitrex refill  

## 2016-03-23 ENCOUNTER — Encounter: Payer: Medicare Other | Admitting: Internal Medicine

## 2016-03-29 ENCOUNTER — Encounter: Payer: Self-pay | Admitting: Infectious Diseases

## 2016-03-29 ENCOUNTER — Telehealth: Payer: Self-pay | Admitting: Internal Medicine

## 2016-03-29 ENCOUNTER — Telehealth: Payer: Self-pay | Admitting: *Deleted

## 2016-03-29 ENCOUNTER — Ambulatory Visit: Payer: Medicare Other | Admitting: Infectious Diseases

## 2016-03-29 NOTE — Telephone Encounter (Signed)
Patient's sister, [name], canceled appointment for today with Dr. [name].  Patient's sister is requesting a letter from Dr. [name] for Mr [place] attorney that is needed by the end of this week.  Patient's sister stated that Dr. [name] was aware of what needed to be included in the letter.  RN asked the patient's sister to have the patient's attorney contact [facility] with what was needed in the letter.  RN explained that these types of letters needed to be discussed face-to-face with Dr. [name].  Ms. Bland Span is not currently listed as the patient's [place] in [place].  Ms Bland Span was insistent that Dr. [name] compose the letter and she asked that Dr. [name] call her today, [phone].  RN routed this message to Dr. [name].

## 2016-03-29 NOTE — Telephone Encounter (Signed)
AP. REMINDER  CALL, LMTCB °

## 2016-03-30 ENCOUNTER — Telehealth: Payer: Self-pay

## 2016-03-30 ENCOUNTER — Encounter: Payer: Self-pay | Admitting: Infectious Diseases

## 2016-03-30 ENCOUNTER — Encounter: Payer: Medicare Other | Admitting: Internal Medicine

## 2016-03-31 ENCOUNTER — Telehealth: Payer: Self-pay | Admitting: *Deleted

## 2016-03-31 ENCOUNTER — Ambulatory Visit: Payer: Medicare Other | Admitting: Physical Therapy

## 2016-03-31 ENCOUNTER — Telehealth: Payer: Self-pay | Admitting: Internal Medicine

## 2016-03-31 ENCOUNTER — Ambulatory Visit: Payer: Medicare Other

## 2016-03-31 DIAGNOSIS — D631 Anemia in chronic kidney disease: Secondary | ICD-10-CM | POA: Diagnosis not present

## 2016-03-31 DIAGNOSIS — N182 Chronic kidney disease, stage 2 (mild): Secondary | ICD-10-CM | POA: Diagnosis not present

## 2016-03-31 DIAGNOSIS — Z6831 Body mass index (BMI) 31.0-31.9, adult: Secondary | ICD-10-CM | POA: Diagnosis not present

## 2016-03-31 DIAGNOSIS — N2581 Secondary hyperparathyroidism of renal origin: Secondary | ICD-10-CM | POA: Diagnosis not present

## 2016-03-31 DIAGNOSIS — I129 Hypertensive chronic kidney disease with stage 1 through stage 4 chronic kidney disease, or unspecified chronic kidney disease: Secondary | ICD-10-CM | POA: Diagnosis not present

## 2016-03-31 NOTE — Telephone Encounter (Signed)
APT. REMINDER CALL, LMTCB °

## 2016-03-31 NOTE — Telephone Encounter (Signed)
Lawyer, Dennison Bulla called requesting that the letter that was faxed on 03/30/16 be sent again with the [facility] for Infectious disease letter head. The letter from 03/29/16 had the letter head on it and the letter from 9/28 does not. It can be faxed to [phone]. He said if it is too much trouble he understands.

## 2016-04-03 ENCOUNTER — Ambulatory Visit (INDEPENDENT_AMBULATORY_CARE_PROVIDER_SITE_OTHER): Payer: Medicare Other | Admitting: Internal Medicine

## 2016-04-03 ENCOUNTER — Encounter: Payer: Self-pay | Admitting: Internal Medicine

## 2016-04-03 ENCOUNTER — Other Ambulatory Visit: Payer: Self-pay | Admitting: Internal Medicine

## 2016-04-03 VITALS — BP 132/91 | HR 96 | Temp 98.7°F | Wt 214.9 lb

## 2016-04-03 DIAGNOSIS — M1711 Unilateral primary osteoarthritis, right knee: Secondary | ICD-10-CM

## 2016-04-03 DIAGNOSIS — Z87891 Personal history of nicotine dependence: Secondary | ICD-10-CM

## 2016-04-03 MED ORDER — DULOXETINE HCL 60 MG PO CPEP: 60.0000 mg | ORAL_CAPSULE | Freq: Every day | ORAL | 0 refills | Status: DC

## 2016-04-03 MED ORDER — DULOXETINE HCL 30 MG PO CPEP: 30.0000 mg | ORAL_CAPSULE | Freq: Every day | ORAL | 0 refills | Status: DC

## 2016-04-03 NOTE — Telephone Encounter (Signed)
Duloxetine 30 mg dose if for the 1st week only (total of 7 days). Then, patient is to start taking Duloxetine 60 mg daily (will change this to a 90 day supply).

## 2016-04-03 NOTE — Progress Notes (Signed)
   CC: Patient is here to discuss his chronic R knee pain.  HPI:  [name] is a 55 y.o. M with a PMHx of conditions listed below presenting to the clinic to discuss his chronic right knee pain. Patient was accompanied by his sister during this visit who was concerned about the patient having a skin rash. Patient refused to discuss his skin rash and refused for his skin to be examined during this visit. Discussed scheduling patient for another appointment at his earliest convenience to discuss the rash but patient adamantly refused. Please see problem based charting for the status of the patient's chronic medical conditions.   Past Medical History:  Diagnosis Date  . Arthritis    r knee   . Asthma    very rare  . Axillary lymphadenopathy   . Bronchitis   . Chronic anemia    normocytic  . Chronic folliculitis   . Elevated PSA   . Environmental and seasonal allergies   . GERD (gastroesophageal reflux disease)   . Gross hematuria   . H/O pericarditis    2010--  myopercarditis--  resolved  . HCAP (healthcare-associated pneumonia) 11/19/2014  . Headache(784.0)    HX SEVERE FRONTAL HA'S  . History of concussion    2012  &  2013  RESIDUAL HA'S --  RESOLVED  . History of gastric ulcer   . History of MRSA infection 2010   infected boil  . HIV (human immunodeficiency virus infection) ([place]) 1988  . Kidney stones   . Lytic bone lesion of hip    WORK-UP DONE BY ONCOLOGIST DR HA --  NOT MALIGNANT  . Pneumonia    hx of  . Post concussion syndrome    resolved  . Prostate cancer ([place]) 04/25/13   gleason 3+3=6, volume 30 gm  . Sinusitis, chronic 05/14/2015  . Ulcer ([place])    hx of gastric  . Wears glasses     Review of Systems:  Pertinent positives mentioned in HPI. Remainder of all ROS negative.   Physical Exam:  Vitals:   04/03/16 0912  BP: (!) 132/91  Pulse: 96  Temp: 98.7 F (37.1 C)  TempSrc: Oral  SpO2: 97%  Weight: 214 lb 14.4 oz (97.5 kg)   Physical Exam    Constitutional: He is oriented to person, place, and time. He appears well-developed and well-nourished. No distress.  HENT:  Head: Normocephalic and atraumatic.  Eyes: EOM are normal.  Cardiovascular: Normal rate, regular rhythm and intact distal pulses.   Pulmonary/Chest: Effort normal and breath sounds normal. No respiratory distress.  Abdominal: Soft. Bowel sounds are normal. He exhibits no distension. There is no tenderness. There is no guarding.  Musculoskeletal:  Knee examination: Both knees appear symmetrical in size and have normal ROM. No erythema, increased warmth, effusion or crepitus noted.   Neurological: He is alert and oriented to person, place, and time.    Assessment & Plan:   See Encounters Tab for problem based charting.  Patient discussed with Dr. Lynnae January

## 2016-04-03 NOTE — Patient Instructions (Signed)
STOP taking Trazodone    Start taking Cymbalta: take 30 mg daily for 1 week, then start taking 60 mg daily

## 2016-04-03 NOTE — Assessment & Plan Note (Signed)
HPI Patient reports having persistent pain in his R knee which is worse with walking, despite taking Meloxicam 7.5 daily. States he has tried knee steroid injections in the past and they made the pain worse. During his previous visit, patient was given a referral to orthopedics and physical therapy. Patient's sister states patient has not scheduled these appointments yet.   A Osteoarthritis of R knee. MRI of R knee done 04/2014 showing tricompartmental osteoarthritis of the right knee, most severe in the medial femorotibial compartment with extensive full-thickness cartilage loss. Also showing severe degenerative tear versus prior meniscectomy. Showing complex tear of the posterior horn- body junction of the lateral meniscus. Possible radial tear of the posterior horn of the lateral meniscus adjacent to the meniscal root. Showing chronic, complete ACL tear.  P -Patient noted to be on Trazodone 100 mg qhs prn sleep. Sister stated he has not taken this medication in over 3 weeks. Discontinued Trazodone and instead started him on Cymbalta: 30 mg daily for 1 week, then 60 mg daily for his chronic knee pain.  -Encouraged patient to schedule his appointment with PT and orthopedics -Continue Meloxicam  -RTC in 1 month

## 2016-04-04 ENCOUNTER — Encounter: Payer: Medicare Other | Admitting: Internal Medicine

## 2016-04-04 NOTE — Progress Notes (Signed)
Internal Medicine Clinic Attending  Case discussed with Dr. Rathoreat the time of the visit. We reviewed the resident's history and exam and pertinent patient test results. I agree with the assessment, diagnosis, and plan of care documented in the resident's note.  

## 2016-04-05 NOTE — Addendum Note (Signed)
Addended by: Lorne Skeens D on: 04/05/2016 05:32 PM   Modules accepted: Orders

## 2016-04-08 ENCOUNTER — Other Ambulatory Visit: Payer: Self-pay | Admitting: Internal Medicine

## 2016-04-08 DIAGNOSIS — G43009 Migraine without aura, not intractable, without status migrainosus: Secondary | ICD-10-CM

## 2016-04-12 ENCOUNTER — Telehealth: Payer: Self-pay | Admitting: Internal Medicine

## 2016-04-12 NOTE — Telephone Encounter (Signed)
REMINDER CALL, LMTCB

## 2016-04-13 ENCOUNTER — Encounter: Payer: Medicare Other | Admitting: Internal Medicine

## 2016-04-15 ENCOUNTER — Other Ambulatory Visit: Payer: Self-pay | Admitting: Infectious Diseases

## 2016-04-15 DIAGNOSIS — B20 Human immunodeficiency virus [HIV] disease: Secondary | ICD-10-CM

## 2016-04-20 ENCOUNTER — Other Ambulatory Visit: Payer: Medicare Other

## 2016-04-20 ENCOUNTER — Encounter: Payer: Self-pay | Admitting: Pharmacist

## 2016-04-20 ENCOUNTER — Other Ambulatory Visit (HOSPITAL_COMMUNITY)
Admission: RE | Admit: 2016-04-20 | Discharge: 2016-04-20 | Disposition: A | Discharge status: Home / Self Care | Payer: PRIVATE HEALTH INSURANCE | Source: Ambulatory Visit | Attending: Infectious Diseases | Admitting: Infectious Diseases

## 2016-04-20 DIAGNOSIS — Z113 Encounter for screening for infections with a predominantly sexual mode of transmission: Secondary | ICD-10-CM

## 2016-04-20 DIAGNOSIS — B20 Human immunodeficiency virus [HIV] disease: Secondary | ICD-10-CM | POA: Diagnosis not present

## 2016-04-20 DIAGNOSIS — Z79899 Other long term (current) drug therapy: Secondary | ICD-10-CM | POA: Diagnosis not present

## 2016-04-20 DIAGNOSIS — G43009 Migraine without aura, not intractable, without status migrainosus: Secondary | ICD-10-CM

## 2016-04-20 LAB — CBC WITH DIFFERENTIAL/PLATELET
Basophils Absolute: 0 cells/uL (ref 0–200)
Basophils Relative: 0 %
Eosinophils Absolute: 288 cells/uL (ref 15–500)
Eosinophils Relative: 4 %
HCT: 44.5 % (ref 38.5–50.0)
Hemoglobin: 15.2 g/dL (ref 13.2–17.1)
Lymphocytes Relative: 36 %
Lymphs Abs: 2592 cells/uL (ref 850–3900)
MCH: 32.9 pg (ref 27.0–33.0)
MCHC: 34.2 g/dL (ref 32.0–36.0)
MCV: 96.3 fL (ref 80.0–100.0)
MPV: 12.3 fL (ref 7.5–12.5)
Monocytes Absolute: 576 cells/uL (ref 200–950)
Monocytes Relative: 8 %
Neutro Abs: 3744 cells/uL (ref 1500–7800)
Neutrophils Relative %: 52 %
Platelets: 245 10*3/uL (ref 140–400)
RBC: 4.62 MIL/uL (ref 4.20–5.80)
RDW: 13.2 % (ref 11.0–15.0)
WBC: 7.2 10*3/uL (ref 3.8–10.8)

## 2016-04-20 LAB — COMPLETE METABOLIC PANEL WITH GFR
ALT: 18 U/L (ref 9–46)
AST: 19 U/L (ref 10–35)
Albumin: 4.4 g/dL (ref 3.6–5.1)
Alkaline Phosphatase: 86 U/L (ref 40–115)
BUN: 16 mg/dL (ref 7–25)
CO2: 28 mmol/L (ref 20–31)
Calcium: 9.7 mg/dL (ref 8.6–10.3)
Chloride: 99 mmol/L (ref 98–110)
Creat: 1.38 mg/dL — ABNORMAL HIGH (ref 0.70–1.33)
GFR, Est African American: 67 mL/min (ref >=60)
GFR, Est Non African American: 58 mL/min — ABNORMAL LOW (ref >=60)
Glucose, Bld: 102 mg/dL — ABNORMAL HIGH (ref 65–99)
Potassium: 3.8 mmol/L (ref 3.5–5.3)
Sodium: 136 mmol/L (ref 135–146)
Total Bilirubin: 0.7 mg/dL (ref 0.2–1.2)
Total Protein: 7.6 g/dL (ref 6.1–8.1)

## 2016-04-20 LAB — LIPID PANEL
Cholesterol: 193 mg/dL (ref 125–200)
HDL: 67 mg/dL (ref >=40)
LDL Cholesterol: 107 mg/dL (ref <130)
Total CHOL/HDL Ratio: 2.9 Ratio (ref <=5.0)
Triglycerides: 97 mg/dL (ref <150)
VLDL: 19 mg/dL (ref <30)

## 2016-04-20 MED ORDER — ELVITEG-COBIC-EMTRICIT-TENOFAF 150-150-200-10 MG PO TABS: 1.0000 | ORAL_TABLET | Freq: Every day | ORAL | 2 refills | Status: DC

## 2016-04-20 MED ORDER — MONTELUKAST SODIUM 10 MG PO TABS: 10.0000 mg | ORAL_TABLET | Freq: Every day | ORAL | 2 refills | Status: DC

## 2016-04-20 MED ORDER — ONDANSETRON HCL 4 MG PO TABS: ORAL_TABLET | ORAL | 2 refills | Status: DC

## 2016-04-20 MED ORDER — OMEPRAZOLE 20 MG PO CPDR: 20.0000 mg | DELAYED_RELEASE_CAPSULE | Freq: Every day | ORAL | 2 refills | Status: DC

## 2016-04-20 MED ORDER — DARUNAVIR ETHANOLATE 800 MG PO TABS: 800.0000 mg | ORAL_TABLET | Freq: Every day | ORAL | 3 refills | Status: DC

## 2016-04-20 NOTE — Progress Notes (Incomplete)
Patient ID: Casey Brennan, male   DOB: [birth year], 55 y.o.   MRN: [id number]  Casey Brennan and his sister came in today for lab work and asked to speak to someone about his medications.  Casey Brennan has not seen a provider since March.  In the past, Dr. Baxter Flattery and Dr. [name] have tried to change his medications to [place] and the patient and sister were always hesitant.  Now, the patient is ready to try new medications. Based on previous notes, I will change him from Truvada + Prezista + Norvir + Isentress to [place].  I counseled both him and his sister on the new medications and showed him on the HIV chart how similar the two regimens were. He is agreeable to try it. He also requested refills on the zofran and prilosec, which I refilled for him.  Patient also complaining of allergies and sinus issues after trying Allegra-D and several other OTC medications.  I gave him a trial of Singular to take daily for his allergies as well.

## 2016-04-20 NOTE — Progress Notes (Signed)
Patient ID: Casey Brennan, male   DOB: August 28, 1960, 55 y.o.   MRN: [id number]  Card[place] and his sister came in today for labs and requested to speak to someone about his stomach pains and Truvada.  I met with patient and sister.  Patient has not seen provider since March.  Based on the last office visits with Dr. Baxter Flattery and Dr. [name], they have been trying to switch him to [place] but patient and sister have refused. They are now willing and ready to switch.  I will switch him from Prezista + norvir + istentress + truvada to [place]. I educated both him and his sister on the two medications and instructed him to stop taking the norvir, isentress, and truvada. I also refilled his prilosec and zofran.  He is also suffering from allergies and has tried several OTC medications. I will give him a trial of singulair to take daily for his allergies as well.  Plan discussed with Dr. [name].

## 2016-04-21 LAB — T-HELPER CELL (CD4) - (RCID CLINIC ONLY)
CD4 % Helper T Cell: 21 % — ABNORMAL LOW (ref 33–55)
CD4 T Cell Abs: 560 /uL (ref 400–2700)

## 2016-04-21 LAB — RPR

## 2016-04-24 ENCOUNTER — Other Ambulatory Visit: Payer: Self-pay | Admitting: *Deleted

## 2016-04-24 DIAGNOSIS — B20 Human immunodeficiency virus [HIV] disease: Secondary | ICD-10-CM

## 2016-04-24 DIAGNOSIS — G43009 Migraine without aura, not intractable, without status migrainosus: Secondary | ICD-10-CM

## 2016-04-24 DIAGNOSIS — J302 Other seasonal allergic rhinitis: Secondary | ICD-10-CM

## 2016-04-24 DIAGNOSIS — K219 Gastro-esophageal reflux disease without esophagitis: Secondary | ICD-10-CM

## 2016-04-24 LAB — URINE CYTOLOGY ANCILLARY ONLY
Chlamydia: NEGATIVE
Neisseria Gonorrhea: NEGATIVE

## 2016-04-24 MED ORDER — DARUNAVIR ETHANOLATE 800 MG PO TABS: 800.0000 mg | ORAL_TABLET | Freq: Every day | ORAL | 5 refills | Status: DC

## 2016-04-24 MED ORDER — OMEPRAZOLE 20 MG PO CPDR: 20.0000 mg | DELAYED_RELEASE_CAPSULE | Freq: Every day | ORAL | 5 refills | Status: DC

## 2016-04-24 MED ORDER — ONDANSETRON HCL 4 MG PO TABS: ORAL_TABLET | ORAL | 2 refills | Status: DC

## 2016-04-24 MED ORDER — ELVITEG-COBIC-EMTRICIT-TENOFAF 150-150-200-10 MG PO TABS: 1.0000 | ORAL_TABLET | Freq: Every day | ORAL | 5 refills | Status: DC

## 2016-04-24 MED ORDER — MONTELUKAST SODIUM 10 MG PO TABS: 10.0000 mg | ORAL_TABLET | Freq: Every day | ORAL | 2 refills | Status: DC

## 2016-04-25 ENCOUNTER — Encounter: Payer: Medicare Other | Admitting: Internal Medicine

## 2016-04-25 LAB — HIV-1 RNA QUANT-NO REFLEX-BLD
HIV 1 RNA Quant: <20 copies/mL (ref <20)
HIV-1 RNA Quant, Log: <1.3 Log copies/mL (ref <1.30)

## 2016-05-01 NOTE — Addendum Note (Signed)
Addended by: Hulan Fray on: 05/01/2016 07:17 PM   Modules accepted: Orders

## 2016-05-21 ENCOUNTER — Other Ambulatory Visit: Payer: Self-pay | Admitting: Internal Medicine

## 2016-05-22 ENCOUNTER — Encounter: Payer: Self-pay | Admitting: Infectious Diseases

## 2016-05-22 ENCOUNTER — Ambulatory Visit (INDEPENDENT_AMBULATORY_CARE_PROVIDER_SITE_OTHER): Payer: Medicare Other | Admitting: Infectious Diseases

## 2016-05-22 VITALS — BP 134/93 | HR 81 | Temp 98.1°F | Ht 69.0 in | Wt 205.0 lb

## 2016-05-22 DIAGNOSIS — Z79899 Other long term (current) drug therapy: Secondary | ICD-10-CM

## 2016-05-22 DIAGNOSIS — J449 Chronic obstructive pulmonary disease, unspecified: Secondary | ICD-10-CM

## 2016-05-22 DIAGNOSIS — N182 Chronic kidney disease, stage 2 (mild): Secondary | ICD-10-CM | POA: Insufficient documentation

## 2016-05-22 DIAGNOSIS — B20 Human immunodeficiency virus [HIV] disease: Secondary | ICD-10-CM

## 2016-05-22 DIAGNOSIS — G43009 Migraine without aura, not intractable, without status migrainosus: Secondary | ICD-10-CM

## 2016-05-22 DIAGNOSIS — Z23 Encounter for immunization: Secondary | ICD-10-CM

## 2016-05-22 DIAGNOSIS — Z113 Encounter for screening for infections with a predominantly sexual mode of transmission: Secondary | ICD-10-CM

## 2016-05-22 MED ORDER — BECLOMETHASONE DIPROPIONATE 80 MCG/ACT NA AERS: 2.0000 | INHALATION_SPRAY | Freq: Once | NASAL | 2 refills | Status: DC

## 2016-05-22 MED ORDER — ALBUTEROL SULFATE (2.5 MG/3ML) 0.083% IN NEBU: 2.5000 mg | INHALATION_SOLUTION | Freq: Four times a day (QID) | RESPIRATORY_TRACT | 12 refills | Status: DC | PRN

## 2016-05-22 MED ORDER — SUMATRIPTAN SUCCINATE 100 MG PO TABS: 100.0000 mg | ORAL_TABLET | ORAL | 0 refills | Status: DC | PRN

## 2016-05-22 MED ORDER — ALBUTEROL SULFATE HFA 108 (90 BASE) MCG/ACT IN AERS: 2.0000 | INHALATION_SPRAY | RESPIRATORY_TRACT | 3 refills | Status: DC | PRN

## 2016-05-22 NOTE — Assessment & Plan Note (Signed)
Doing well Needs refills

## 2016-05-22 NOTE — Addendum Note (Signed)
Addended by: Landis Gandy on: 05/22/2016 02:19 PM   Modules accepted: Orders

## 2016-05-22 NOTE — Assessment & Plan Note (Signed)
He is doing well Will start on hew rx today (genvoya, darunavir).  Flu shot today.  [place] today.  rtc 6 months.

## 2016-05-22 NOTE — Progress Notes (Signed)
   Subjective:    Patient ID: Casey Brennan, male    DOB: [birth year], 55 y.o.   MRN: [id number]  HPI 55 yo M with hx HIV+, COPD Gold,  and prostate Ca. Currently taking ISN/TRV/DRVr.  Had radical prostatectomy 08-18-13, adenoCA, LN (-), margins (-).  Had R knee arthroscopy 1985, prev MRI and has severe osteoarthritis.  Is on isentress/darunavir/norvir/descovy. Was suggested he change to genvoya, prezista (will pick up today).    Has been worried about increased wt gain, abd girth.  Had flank pain, fatigue, as well as pain in his feet.  Has been having folliculits as well. No new soaps or shampoos. No new laundry detergent.    Cr 1.38 <--- 1.24  HIV 1 RNA Quant (copies/mL)  Date Value  04/20/2016 <20  07/27/2015 <20  05/11/2015 <20   CD4 T Cell Abs (/uL)  Date Value  04/20/2016 560  07/27/2015 450  05/11/2015 360 (L)    Review of Systems  Constitutional: Positive for unexpected weight change.  Skin: Positive for rash.       Objective:   Physical Exam  Constitutional: He appears well-developed and well-nourished.  HENT:  Mouth/Throat: No oropharyngeal exudate.  Eyes: EOM are normal. Pupils are equal, round, and reactive to light.  Neck: Neck supple.  Cardiovascular: Normal rate, regular rhythm and normal heart sounds.   Pulmonary/Chest: Effort normal and breath sounds normal.  Abdominal: Soft. Bowel sounds are normal. There is no tenderness. There is no rebound.  Musculoskeletal: He exhibits no edema.  Lymphadenopathy:    He has no cervical adenopathy.  Skin: Rash noted.      Assessment & Plan:

## 2016-05-22 NOTE — Assessment & Plan Note (Signed)
Slightly worse.  Appreciate renal f/u.

## 2016-05-23 DIAGNOSIS — C61 Malignant neoplasm of prostate: Secondary | ICD-10-CM | POA: Diagnosis not present

## 2016-05-23 DIAGNOSIS — N5201 Erectile dysfunction due to arterial insufficiency: Secondary | ICD-10-CM | POA: Diagnosis not present

## 2016-05-24 ENCOUNTER — Other Ambulatory Visit: Payer: Self-pay | Admitting: *Deleted

## 2016-06-03 ENCOUNTER — Other Ambulatory Visit: Payer: Self-pay | Admitting: Internal Medicine

## 2016-06-06 DIAGNOSIS — B20 Human immunodeficiency virus [HIV] disease: Secondary | ICD-10-CM | POA: Diagnosis not present

## 2016-06-06 DIAGNOSIS — H524 Presbyopia: Secondary | ICD-10-CM | POA: Diagnosis not present

## 2016-06-06 DIAGNOSIS — H52203 Unspecified astigmatism, bilateral: Secondary | ICD-10-CM | POA: Diagnosis not present

## 2016-06-06 DIAGNOSIS — H5213 Myopia, bilateral: Secondary | ICD-10-CM | POA: Diagnosis not present

## 2016-06-06 DIAGNOSIS — H2513 Age-related nuclear cataract, bilateral: Secondary | ICD-10-CM | POA: Diagnosis not present

## 2016-06-08 NOTE — Telephone Encounter (Signed)
Med stopped by neuro. Last got 3 month supply 01/10/16 prior to neuro appt. I cannot restart. Will need to discuss with PCP

## 2016-06-08 NOTE — Telephone Encounter (Signed)
Trazodone is not on current medlist.

## 2016-07-11 DIAGNOSIS — R310 Gross hematuria: Secondary | ICD-10-CM | POA: Diagnosis not present

## 2016-07-11 DIAGNOSIS — R102 Pelvic and perineal pain: Secondary | ICD-10-CM | POA: Diagnosis not present

## 2016-07-11 DIAGNOSIS — N5201 Erectile dysfunction due to arterial insufficiency: Secondary | ICD-10-CM | POA: Diagnosis not present

## 2016-07-11 DIAGNOSIS — N393 Stress incontinence (female) (male): Secondary | ICD-10-CM | POA: Diagnosis not present

## 2016-07-11 DIAGNOSIS — C61 Malignant neoplasm of prostate: Secondary | ICD-10-CM | POA: Diagnosis not present

## 2016-07-12 DIAGNOSIS — M1711 Unilateral primary osteoarthritis, right knee: Secondary | ICD-10-CM | POA: Diagnosis not present

## 2016-07-17 DIAGNOSIS — Z96651 Presence of right artificial knee joint: Secondary | ICD-10-CM | POA: Diagnosis not present

## 2016-07-17 DIAGNOSIS — M1711 Unilateral primary osteoarthritis, right knee: Secondary | ICD-10-CM | POA: Diagnosis not present

## 2016-07-18 NOTE — H&P (Signed)
PREOPERATIVE H&P Patient ID: Casey Brennan MRN: [id number] DOB/AGE: 54-Feb-1962 54 y.o.  Chief Complaint: OA RIGHT KNEE  Planned Procedure Date: 08/01/16 Medical and Cardiac Clearance by Dr. [name]    HPI: Casey Brennan is a 56 y.o. male with history of HIV, COPD, GERD, and hypertension who presents for evaluation of OA RIGHT KNEE. The patient has a history of pain and functional disability in the right knee due to arthritis and has failed non-surgical conservative treatments for greater than 12 weeks to include NSAID's and/or analgesics, corticosteriod injections and activity modification.  Onset of symptoms was gradual, starting >10 years ago with gradually worsening course since that time. He did have right knee arthroscopy in 1985.  Patient currently rates pain at 7 out of 10 with activity. Patient has night pain, worsening of pain with activity and weight bearing, pain that interferes with activities of daily living and joint swelling.  Patient has evidence of subchondral cysts, subchondral sclerosis, periarticular osteophytes and joint space narrowing by imaging studies.  There is no active infection.  Past Medical History:  Diagnosis Date  . Arthritis    r knee   . Asthma    very rare  . Axillary lymphadenopathy   . Bronchitis   . Chronic anemia    normocytic  . Chronic folliculitis   . Elevated PSA   . Environmental and seasonal allergies   . GERD (gastroesophageal reflux disease)   . Gross hematuria   . H/O pericarditis    2010--  myopercarditis--  resolved  . HCAP (healthcare-associated pneumonia) 11/19/2014  . Headache(784.0)    HX SEVERE FRONTAL HA'S  . History of concussion    2012  &  2013  RESIDUAL HA'S --  RESOLVED  . History of gastric ulcer   . History of MRSA infection 2010   infected boil  . HIV (human immunodeficiency virus infection) ([place]) 1988  . Kidney stones   . Lytic bone lesion of hip    WORK-UP DONE BY ONCOLOGIST DR HA --  NOT MALIGNANT  .  Pneumonia    hx of  . Post concussion syndrome    resolved  . Prostate cancer ([place]) 04/25/13   gleason 3+3=6, volume 30 gm  . Sinusitis, chronic 05/14/2015  . Ulcer ([place])    hx of gastric  . Wears glasses    Past Surgical History:  Procedure Laterality Date  . CARDIAC CATHETERIZATION  03-23-2009  DR COOPER   NORMAL CORONARY ARTERIES  . CHOLECYSTECTOMY N/A 11/06/2014   Procedure: LAPAROSCOPIC CHOLECYSTECTOMY WITH INTRAOPERATIVE CHOLANGIOGRAM;  Surgeon: Judeth Horn, MD;  Location: [facility];  Service: General;  Laterality: N/A;  . COLONOSCOPY  12/26/2011   Procedure: COLONOSCOPY;  Surgeon: Lear Ng, MD;  Location: [facility];  Service: Endoscopy;  Laterality: N/A;  . COLONOSCOPY WITH PROPOFOL N/A 07/29/2014   Procedure: COLONOSCOPY WITH PROPOFOL;  Surgeon: Lear Ng, MD;  Location: [facility];  Service: Endoscopy;  Laterality: N/A;  . CYSTOSCOPY/RETROGRADE/URETEROSCOPY Bilateral 04/25/2013   Procedure: CYSTOSCOPY/ BILATERAL RETROGRADES; BLADDER BIOPSIES;  Surgeon: Alexis Frock, MD;  Location[facility];  Service: Urology;  Laterality: Bilateral;  . DENTAL EXAMINATION UNDER ANESTHESIA    . ESOPHAGOGASTRODUODENOSCOPY  12/26/2011   Procedure: ESOPHAGOGASTRODUODENOSCOPY (EGD);  Surgeon: Lear Ng, MD;  Location: [facility];  Service: Endoscopy;  Laterality: N/A;  . ESOPHAGOGASTRODUODENOSCOPY (EGD) WITH PROPOFOL N/A 07/29/2014   Procedure: ESOPHAGOGASTRODUODENOSCOPY (EGD) WITH PROPOFOL;  Surgeon: Lear Ng, MD;  Location: [facility];  Service: Endoscopy;  Laterality:  N/A;  . EXCISION CHRONIC LEFT BREAST ABSCESS  09-21-2010  . EXCISIONAL BX LEFT BREAST MASS/  I  &  D LEFT BREAST ABSCESS  03-24-2009  . KNEE ARTHROSCOPY Right 1985  . left axilla biopsy  04/2013  . left hip biopsy  10/2012  . LYMPHADENECTOMY Bilateral 08/18/2013   Procedure: LYMPHADENECTOMY "PELVIC LYMPH NODE DISSECTION";  Surgeon: Alexis Frock, MD;  Location: [facility];   Service: Urology;  Laterality: Bilateral;  . PROSTATE BIOPSY N/A 04/25/2013   Procedure: BIOPSY TRANSRECTAL ULTRASONIC PROSTATE (TUBP);  Surgeon: Alexis Frock, MD;  Location: [facility];  Service: Urology;  Laterality: N/A;  . ROBOT ASSISTED LAPAROSCOPIC RADICAL PROSTATECTOMY N/A 08/18/2013   Procedure: ROBOTIC ASSISTED LAPAROSCOPIC RADICAL PROSTATECTOMY;  Surgeon: Alexis Frock, MD;  Location: [facility];  Service: Urology;  Laterality: N/A;  . UPPER LEG SOFT TISSUE BIOPSY Left 2012   lthigh   Allergies  Allergen Reactions  . Morphine And Related Shortness Of Breath, Itching and Other (See Comments)     chest pain  . Penicillins Shortness Of Breath and Rash    Has patient had a PCN reaction causing immediate rash, facial/tongue/throat swelling, SOB or lightheadedness with hypotension: Yes Has patient had a PCN reaction causing severe rash involving mucus membranes or skin necrosis: No Has patient had a PCN reaction that required hospitalization No Has patient had a PCN reaction occurring within the last 10 years: Yes If all of the above answers are "NO", then may proceed with Cephalosporin use.  . Sulfonamide Derivatives Shortness Of Breath and Rash  . Chocolate     Asthma attack, welts  . Sulfa Antibiotics   . Tomato   . Tramadol Itching and Nausea And Vomiting  . Tylenol [Acetaminophen] Rash   Prior to Admission medications   Medication Sig Start Date End Date Taking? Authorizing Provider  albuterol (PROVENTIL HFA;VENTOLIN HFA) 108 (90 Base) MCG/ACT inhaler Inhale 2 puffs into the lungs every 4 (four) hours as needed for wheezing or shortness of breath. 05/22/16  Yes Campbell Riches, MD  albuterol (PROVENTIL) (2.5 MG/3ML) 0.083% nebulizer solution Take 3 mLs (2.5 mg total) by nebulization every 6 (six) hours as needed for wheezing or shortness of breath. 05/22/16  Yes Campbell Riches, MD  amLODipine (NORVASC) 5 MG tablet Take 5 mg by mouth at bedtime.  04/20/16  Yes  Historical Provider, MD  aspirin EC 81 MG tablet Take 81 mg by mouth See admin instructions. Monday, Wednesday, & Saturday At bedtime   Yes Historical Provider, MD  Beclomethasone Dipropionate 80 MCG/ACT AERS Place 2 sprays into the nose once. Patient taking differently: Place 1 spray into the nose 2 (two) times daily.  05/22/16 07/18/17 Yes Campbell Riches, MD  Darunavir Ethanolate (PREZISTA) 800 MG tablet Take 1 tablet (800 mg total) by mouth daily. Patient taking differently: Take 800 mg by mouth at bedtime.  04/24/16  Yes Campbell Riches, MD  diclofenac sodium (VOLTAREN) 1 % GEL APPLY TO THE AFFECTED AREA TWICE DAILY AS NEEDED Patient taking differently: Apply 1 application topically 2 (two) times daily as needed (pain).  02/05/15  Yes Campbell Riches, MD  elvitegravir-cobicistat-emtricitabine-tenofovir (GENVOYA) 150-150-200-10 MG TABS tablet Take 1 tablet by mouth daily with breakfast. Patient taking differently: Take 1 tablet by mouth at bedtime.  04/24/16  Yes Campbell Riches, MD  furosemide (LASIX) 40 MG tablet Take 0.5 tablets (20 mg total) by mouth daily. Patient taking differently: Take 20 mg by mouth at bedtime.  02/22/16  Yes Juliet Rude, MD  gabapentin (NEURONTIN) 300 MG capsule Take 1 capsule (300 mg total) by mouth 4 (four) times daily as needed (nerve pain). Patient taking differently: Take 300 mg by mouth 3 (three) times daily as needed (nerve pain).  09/07/15  Yes Dennie Bible, NP  hydrochlorothiazide (HYDRODIURIL) 25 MG tablet Take 1 tablet (25 mg total) by mouth daily. Patient taking differently: Take 25 mg by mouth at bedtime.  10/14/15  Yes Konrad Felix, PA  hydrOXYzine (ATARAX/VISTARIL) 25 MG tablet TAKE 1 TABLET(25 MG) BY MOUTH EVERY 6 HOURS AS NEEDED FOR ANXIETY Patient taking differently: TAKE 1 TABLET(25 MG) BY MOUTH EVERY 6 HOURS AS NEEDED FOR ITCHING 12/20/15  Yes Campbell Riches, MD  ipratropium (ATROVENT) 0.02 % nebulizer solution USE 2.5 ML(0.5 MG)  VIA NEBULIZER THREE TIMES DAILY AS NEEDED FOR WHEEZING OR SHORTNESS OF BREATH 01/11/16  Yes Carly Montey Hora, MD  mometasone-formoterol (DULERA) 200-5 MCG/ACT AERO Take 2 puffs first thing in am and then another 2 puffs about 12 hours later. Patient taking differently: Inhale 1 puff into the lungs every 12 (twelve) hours.  07/22/14  Yes Tanda Rockers, MD  montelukast (SINGULAIR) 10 MG tablet Take 1 tablet (10 mg total) by mouth at bedtime. 04/24/16  Yes Campbell Riches, MD  Multiple Vitamin (MULTIVITAMIN WITH MINERALS) TABS tablet Take 1 tablet by mouth at bedtime.   Yes Historical Provider, MD  mupirocin ointment (BACTROBAN) 2 % APPLY TO THE AFFECTED AREA TWICE DAILY FOR 2 WEEKS, PLACE COTTON BETWEEN TOES TO ALLOW BETTER AERATION AS NEEDED 11/15/15  Yes Campbell Riches, MD  omeprazole (PRILOSEC) 20 MG capsule Take 1 capsule (20 mg total) by mouth at bedtime. 04/24/16  Yes Campbell Riches, MD  ondansetron (ZOFRAN) 4 MG tablet TAKE 1 TABLET BY MOUTH EVERY 8 HOURS AS NEEDED FOR NAUSEA AND VOMITING Patient taking differently: Take 4 mg by mouth every 4 (four) hours as needed for nausea or vomiting.  04/24/16  Yes Campbell Riches, MD  Polyethyl Glycol-Propyl Glycol (SYSTANE) 0.4-0.3 % GEL ophthalmic gel Place 1 application into both eyes daily.   Yes Historical Provider, MD  SUMAtriptan (IMITREX) 100 MG tablet Take 1 tablet (100 mg total) by mouth every 2 (two) hours as needed for migraine. May repeat in 2 hours if headache persists or recurs. 05/22/16  Yes Campbell Riches, MD  tizanidine (ZANAFLEX) 6 MG capsule Take 1 capsule (6 mg total) by mouth 3 (three) times daily as needed for muscle spasms. 09/07/15  Yes Dennie Bible, NP  zolpidem (AMBIEN CR) 12.5 MG CR tablet Take 12.5 mg by mouth at bedtime.   Yes Historical Provider, MD  meloxicam (MOBIC) 7.5 MG tablet Take 1 tablet (7.5 mg total) by mouth daily. Patient not taking: Reported on 07/18/2016 01/11/16 01/10/17  Holley Raring, MD   Social  History   Social History  . Marital status: Divorced    Spouse name: N/A  . Number of children: 1  . Years of education: College   Occupational History  . 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 disability    Social History Main Topics  . Smoking status: Former Smoker    Packs/day: 1.50    Years: 23.00    Types: Cigars, Cigarettes    Quit date: 05/22/2010  . Smokeless tobacco: Never Used  . Alcohol use No  . Drug use: No  . Sexual activity: Not Currently    Birth control/ protection: Condom  Comment: pt. declined condoms   Other Topics Concern  . Not on file   Social History Narrative   Sister April stays with him currently although he hopes to return to truck driving. 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 Sister is starting medical school next year.   Patient is divorced and lives with his sister.   Patient has one adult son.   Patient is part-time, driving a truck.   Patient has a college education.   Patient is right-handed.   Patient drinks 2-3 cups of coffee and a 4-5 cups of tea.   Family History  Problem Relation Age of Onset  . Stroke Father   . Diabetes Father   . Cancer Father     brain cancer  . Hypertension Sister   . Cancer Maternal Uncle     prostate cancer  . Asthma Sister   . Asthma Father     ROS: Currently denies lightheadedness, dizziness, Fever, chills, CP, SOB.   No personal history of DVT, PE, MI, or CVA. He has dentures All other systems have been reviewed and were otherwise currently negative with the exception of those mentioned in the HPI and as above.  Objective: Vitals: Ht: 5'9" Wt: 223 Temp: 98.1 BP: 121/77 Pulse: 89 O2 90 % on room air. Physical Exam: General: Alert, NAD.  Antalgic Gait  HEENT: EOMI, Good Neck Extension  Pulm: No increased work of breathing.  Clear B/L A/P w/o crackle or wheeze.  CV: RRR, No m/g/r appreciated  GI: soft, NT, ND Neuro: Neuro without gross focal deficit.  Sensation intact distally Skin:  No lesions in the area of chief complaint MSK/Surgical Site: Right knee w/o redness or effusion.  +Medial JLT. ROM 0-110.  5/5 strength in extension and flexion.  +EHL/FHL.  NVI.  Lachman / Anterior drawer suggest absent anterior cruciate ligament. Stable to varus valgus stress.  Imaging Review Plain radiographs demonstrate severe anteromedial degenerative joint disease of the right knee.   Assessment: OA RIGHT KNEE Principal Problem:   Osteoarthritis of right knee Active Problems:   Human immunodeficiency virus (HIV) disease (HCC)   HTN (hypertension)   Asthma   H/O prostate cancer   COPD GOLD III   GERD (gastroesophageal reflux disease)   Low back pain   Chronic renal insufficiency, stage 2 (mild)   Plan: Plan for Procedure(s): TOTAL KNEE ARTHROPLASTY  The patient history, physical exam, clinical judgement of the provider and imaging are consistent with end stage degenerative joint disease and total joint arthroplasty is deemed medically necessary. The treatment options including medical management, injection therapy, and arthroplasty were discussed at length. The risks and benefits of Procedure(s): TOTAL KNEE ARTHROPLASTY were presented and reviewed.  The risks of nonoperative treatment, versus surgical intervention including but not limited to continued pain, aseptic loosening, stiffness, dislocation/subluxation, infection, bleeding, nerve injury, blood clots, cardiopulmonary complications, morbidity, mortality, among others were discussed. The patient verbalizes understanding and wishes to proceed with the plan.  Patient is being admitted for inpatient treatment for surgery, pain control, PT, OT, prophylactic antibiotics, VTE prophylaxis, progressive ambulation, ADL's and discharge planning.   Dental prophylaxis discussed and recommended for 2 years postoperatively.  The patient does meet the criteria for TXA which will be used perioperatively via IV.   Lovenox will be used  postoperatively for DVT prophylaxis in addition to SCDs, and early ambulation. No PCN or Celebrex dt allergies. The patient is planning to be discharged home with home health services in care of his  sister.  Prudencio Burly III, PA-C 07/18/2016 1:34 PM

## 2016-07-20 NOTE — Pre-Procedure Instructions (Signed)
    Casey Brennan  07/20/2016      [id number] - [address], [place] [address] Phone: [phone] Fax: [phone]    Your procedure is scheduled on 08/01/16.  Report to [facility] Admitting at 815 A.M.  Call this number if you have problems the morning of surgery:  [phone]   Remember:  Do not eat food or drink liquids after midnight.  Take these medicines the morning of surgery with A SIP OF WATER --all inhalers,norvas,neurontin,singular,prilosec   Do not wear jewelry, make-up or nail polish.  Do not wear lotions, powders, or perfumes, or deoderant.  Do not shave 48 hours prior to surgery.  Men may shave face and neck.  Do not bring valuables to the hospital.  [facility] is not responsible for any belongings or valuables.  Contacts, dentures or bridgework may not be worn into surgery.  Leave your suitcase in the car.  After surgery it may be brought to your room.  For patients admitted to the hospital, discharge time will be determined by your treatment team.  Patients discharged the day of surgery will not be allowed to drive home. Name and phone number of your driver:    Special instructions:  Do not take any aspirin,anti-inflammatories,vitamins,or herbal supplements 5-7 days prior to surgery.  Please read over the following fact sheets that you were given. MRSA Information

## 2016-07-21 ENCOUNTER — Encounter (HOSPITAL_COMMUNITY)
Admission: RE | Admit: 2016-07-21 | Discharge: 2016-07-21 | Disposition: A | Discharge status: Home / Self Care | Payer: 59 | Source: Ambulatory Visit | Attending: Orthopedic Surgery | Admitting: Orthopedic Surgery

## 2016-07-21 ENCOUNTER — Encounter (HOSPITAL_COMMUNITY): Payer: Self-pay

## 2016-07-21 DIAGNOSIS — J449 Chronic obstructive pulmonary disease, unspecified: Secondary | ICD-10-CM | POA: Diagnosis not present

## 2016-07-21 DIAGNOSIS — L989 Disorder of the skin and subcutaneous tissue, unspecified: Secondary | ICD-10-CM | POA: Insufficient documentation

## 2016-07-21 DIAGNOSIS — D509 Iron deficiency anemia, unspecified: Secondary | ICD-10-CM | POA: Insufficient documentation

## 2016-07-21 DIAGNOSIS — M1711 Unilateral primary osteoarthritis, right knee: Secondary | ICD-10-CM | POA: Insufficient documentation

## 2016-07-21 DIAGNOSIS — N182 Chronic kidney disease, stage 2 (mild): Secondary | ICD-10-CM | POA: Insufficient documentation

## 2016-07-21 DIAGNOSIS — Z8546 Personal history of malignant neoplasm of prostate: Secondary | ICD-10-CM | POA: Diagnosis not present

## 2016-07-21 DIAGNOSIS — Z01812 Encounter for preprocedural laboratory examination: Secondary | ICD-10-CM | POA: Diagnosis not present

## 2016-07-21 DIAGNOSIS — G43909 Migraine, unspecified, not intractable, without status migrainosus: Secondary | ICD-10-CM | POA: Diagnosis not present

## 2016-07-21 DIAGNOSIS — Z8679 Personal history of other diseases of the circulatory system: Secondary | ICD-10-CM | POA: Insufficient documentation

## 2016-07-21 DIAGNOSIS — M898X5 Other specified disorders of bone, thigh: Secondary | ICD-10-CM | POA: Insufficient documentation

## 2016-07-21 DIAGNOSIS — B20 Human immunodeficiency virus [HIV] disease: Secondary | ICD-10-CM | POA: Insufficient documentation

## 2016-07-21 LAB — BASIC METABOLIC PANEL
Anion gap: 9 (ref 5–15)
BUN: 13 mg/dL (ref 6–20)
CO2: 27 mmol/L (ref 22–32)
Calcium: 9.6 mg/dL (ref 8.9–10.3)
Chloride: 101 mmol/L (ref 101–111)
Creatinine, Ser: 1.32 mg/dL — ABNORMAL HIGH (ref 0.61–1.24)
GFR calc Af Amer: >60 mL/min (ref >60)
GFR calc non Af Amer: >60 mL/min (ref >60)
Glucose, Bld: 97 mg/dL (ref 65–99)
Potassium: 3.5 mmol/L (ref 3.5–5.1)
Sodium: 137 mmol/L (ref 135–145)

## 2016-07-21 LAB — CBC
HCT: 37.8 % — ABNORMAL LOW (ref 39.0–52.0)
Hemoglobin: 12.7 g/dL — ABNORMAL LOW (ref 13.0–17.0)
MCH: 32 pg (ref 26.0–34.0)
MCHC: 33.6 g/dL (ref 30.0–36.0)
MCV: 95.2 fL (ref 78.0–100.0)
Platelets: 180 10*3/uL (ref 150–400)
RBC: 3.97 MIL/uL — ABNORMAL LOW (ref 4.22–5.81)
RDW: 14.3 % (ref 11.5–15.5)
WBC: 7.3 10*3/uL (ref 4.0–10.5)

## 2016-07-21 LAB — SURGICAL PCR SCREEN
MRSA, PCR: NEGATIVE
Staphylococcus aureus: NEGATIVE

## 2016-07-24 ENCOUNTER — Other Ambulatory Visit: Payer: Self-pay | Admitting: Internal Medicine

## 2016-07-24 ENCOUNTER — Other Ambulatory Visit: Payer: Self-pay | Admitting: *Deleted

## 2016-07-24 ENCOUNTER — Telehealth: Payer: Self-pay | Admitting: Pharmacist Clinician (PhC)/ Clinical Pharmacy Specialist

## 2016-07-24 DIAGNOSIS — G43009 Migraine without aura, not intractable, without status migrainosus: Secondary | ICD-10-CM

## 2016-07-24 NOTE — Telephone Encounter (Signed)
April called with multiple requests for meds change because she feels like she her brother is not doing well on the current HIV regimen. I had to go way back to look at his profile to eval his resistance. From what I gathered, he has K103N and P225H. He was previously managed on DRV/r + Truvada. Then ISN was added for some reason. His regimen was then changed to [facility] + DRV. I explained to her that I feel uncomfortable changing his regimen over the phone. Additionally, she wanted [place] to deal with Tussionex and Dulera issue. She threatened to take to [employer] again.

## 2016-07-26 ENCOUNTER — Other Ambulatory Visit: Payer: Self-pay | Admitting: Infectious Diseases

## 2016-07-26 DIAGNOSIS — B20 Human immunodeficiency virus [HIV] disease: Secondary | ICD-10-CM

## 2016-07-26 NOTE — Telephone Encounter (Signed)
Patient's sister April left message in triage, asked if the new/old prescriptions have been called in to [facility].  She stated that she spoke with a clinic pharmacist who said he felt uncomfortable changing them over the phone.  She is asking Dr [name] to switch these back, although she was told yesterday that Dr [name] is unavailable until Monday. April states that her brother will be off medication until the new prescriptions are sent in. Landis Gandy, RN

## 2016-07-31 MED ORDER — TRANEXAMIC ACID 1000 MG/10ML IV SOLN
1000.0000 mg | INTRAVENOUS | Status: AC
  Administered 2016-08-01: 1000 mg via INTRAVENOUS
  Filled 2016-07-31: qty 10

## 2016-07-31 NOTE — Telephone Encounter (Signed)
Left message for April (her cell is listed as the patient's preferred contact number).  Let her know that I sent her concerns to Dr [name]; he understands they are upset, would like to have them come in for an appointment.  Patient is scheduled tomorrow for surgery, asked that they call once he is settled to make an appointment here. Landis Gandy, RN

## 2016-08-01 ENCOUNTER — Inpatient Hospital Stay (HOSPITAL_COMMUNITY): Payer: 59 | Admitting: Anesthesiology

## 2016-08-01 ENCOUNTER — Inpatient Hospital Stay (HOSPITAL_COMMUNITY): Payer: 59

## 2016-08-01 ENCOUNTER — Inpatient Hospital Stay (HOSPITAL_COMMUNITY)
Admission: RE | Admit: 2016-08-01 | Discharge: 2016-08-03 | DRG: 469 | Disposition: A | Discharge status: Home Health | Payer: 59 | Source: Ambulatory Visit | Attending: Orthopedic Surgery | Admitting: Orthopedic Surgery

## 2016-08-01 ENCOUNTER — Encounter (HOSPITAL_COMMUNITY)
Admission: RE | Disposition: A | Discharge status: Home Health | Payer: Self-pay | Source: Ambulatory Visit | Attending: Orthopedic Surgery

## 2016-08-01 ENCOUNTER — Encounter (HOSPITAL_COMMUNITY): Payer: Self-pay | Admitting: *Deleted

## 2016-08-01 DIAGNOSIS — Z96651 Presence of right artificial knee joint: Secondary | ICD-10-CM

## 2016-08-01 DIAGNOSIS — Z21 Asymptomatic human immunodeficiency virus [HIV] infection status: Secondary | ICD-10-CM | POA: Diagnosis present

## 2016-08-01 DIAGNOSIS — G43009 Migraine without aura, not intractable, without status migrainosus: Secondary | ICD-10-CM

## 2016-08-01 DIAGNOSIS — J328 Other chronic sinusitis: Secondary | ICD-10-CM

## 2016-08-01 DIAGNOSIS — Z885 Allergy status to narcotic agent status: Secondary | ICD-10-CM

## 2016-08-01 DIAGNOSIS — M1711 Unilateral primary osteoarthritis, right knee: Secondary | ICD-10-CM | POA: Diagnosis not present

## 2016-08-01 DIAGNOSIS — Z886 Allergy status to analgesic agent status: Secondary | ICD-10-CM | POA: Diagnosis not present

## 2016-08-01 DIAGNOSIS — Z91018 Allergy to other foods: Secondary | ICD-10-CM | POA: Diagnosis not present

## 2016-08-01 DIAGNOSIS — J45909 Unspecified asthma, uncomplicated: Secondary | ICD-10-CM | POA: Diagnosis not present

## 2016-08-01 DIAGNOSIS — I129 Hypertensive chronic kidney disease with stage 1 through stage 4 chronic kidney disease, or unspecified chronic kidney disease: Secondary | ICD-10-CM | POA: Diagnosis not present

## 2016-08-01 DIAGNOSIS — K219 Gastro-esophageal reflux disease without esophagitis: Secondary | ICD-10-CM | POA: Diagnosis present

## 2016-08-01 DIAGNOSIS — Z87820 Personal history of traumatic brain injury: Secondary | ICD-10-CM

## 2016-08-01 DIAGNOSIS — Z8546 Personal history of malignant neoplasm of prostate: Secondary | ICD-10-CM | POA: Diagnosis not present

## 2016-08-01 DIAGNOSIS — I10 Essential (primary) hypertension: Secondary | ICD-10-CM | POA: Diagnosis not present

## 2016-08-01 DIAGNOSIS — B20 Human immunodeficiency virus [HIV] disease: Secondary | ICD-10-CM

## 2016-08-01 DIAGNOSIS — Z882 Allergy status to sulfonamides status: Secondary | ICD-10-CM

## 2016-08-01 DIAGNOSIS — Z880 Allergy status to penicillin: Secondary | ICD-10-CM

## 2016-08-01 DIAGNOSIS — J449 Chronic obstructive pulmonary disease, unspecified: Secondary | ICD-10-CM | POA: Diagnosis present

## 2016-08-01 DIAGNOSIS — G8918 Other acute postprocedural pain: Secondary | ICD-10-CM | POA: Diagnosis not present

## 2016-08-01 DIAGNOSIS — G8929 Other chronic pain: Secondary | ICD-10-CM

## 2016-08-01 DIAGNOSIS — N182 Chronic kidney disease, stage 2 (mild): Secondary | ICD-10-CM | POA: Diagnosis not present

## 2016-08-01 DIAGNOSIS — M545 Low back pain, unspecified: Secondary | ICD-10-CM

## 2016-08-01 DIAGNOSIS — Z471 Aftercare following joint replacement surgery: Secondary | ICD-10-CM | POA: Diagnosis not present

## 2016-08-01 HISTORY — PX: TOTAL KNEE ARTHROPLASTY: SHX125

## 2016-08-01 SURGERY — ARTHROPLASTY, KNEE, TOTAL
Anesthesia: General | Site: Knee | Laterality: Right

## 2016-08-01 MED ORDER — ENOXAPARIN SODIUM 30 MG/0.3ML ~~LOC~~ SOLN
30.0000 mg | Freq: Two times a day (BID) | SUBCUTANEOUS | Status: DC
  Administered 2016-08-02 – 2016-08-03 (×3): 30 mg via SUBCUTANEOUS
  Filled 2016-08-01 (×3): qty 0.3

## 2016-08-01 MED ORDER — HYDROMORPHONE HCL 1 MG/ML IJ SOLN
INTRAMUSCULAR | Status: AC
  Administered 2016-08-01: 0.5 mg via INTRAVENOUS
  Filled 2016-08-01: qty 1

## 2016-08-01 MED ORDER — FENTANYL CITRATE (PF) 100 MCG/2ML IJ SOLN
INTRAMUSCULAR | Status: DC | PRN
  Administered 2016-08-01: 100 ug via INTRAVENOUS
  Administered 2016-08-01 (×4): 50 ug via INTRAVENOUS

## 2016-08-01 MED ORDER — ALBUTEROL SULFATE (2.5 MG/3ML) 0.083% IN NEBU
INHALATION_SOLUTION | RESPIRATORY_TRACT | Status: AC
  Filled 2016-08-01: qty 3

## 2016-08-01 MED ORDER — OXYCODONE-ACETAMINOPHEN 5-325 MG PO TABS: 1.0000 | ORAL_TABLET | ORAL | 0 refills | Status: DC | PRN

## 2016-08-01 MED ORDER — EMTRICITABINE-TENOFOVIR AF 200-25 MG PO TABS
1.0000 | ORAL_TABLET | Freq: Every day | ORAL | Status: DC
  Administered 2016-08-01 – 2016-08-03 (×3): 1 via ORAL
  Filled 2016-08-01 (×3): qty 1

## 2016-08-01 MED ORDER — METHOCARBAMOL 1000 MG/10ML IJ SOLN
500.0000 mg | Freq: Four times a day (QID) | INTRAVENOUS | Status: DC | PRN
  Filled 2016-08-01: qty 5

## 2016-08-01 MED ORDER — VANCOMYCIN HCL 10 G IV SOLR
1500.0000 mg | INTRAVENOUS | Status: AC
  Administered 2016-08-01: 1500 mg via INTRAVENOUS
  Filled 2016-08-01 (×2): qty 1500

## 2016-08-01 MED ORDER — KETOROLAC TROMETHAMINE 30 MG/ML IJ SOLN
INTRAMUSCULAR | Status: DC | PRN
  Administered 2016-08-01: 30 mg

## 2016-08-01 MED ORDER — HYDROMORPHONE HCL 1 MG/ML IJ SOLN
0.2500 mg | INTRAMUSCULAR | Status: DC | PRN
  Administered 2016-08-01: 0.5 mg via INTRAVENOUS

## 2016-08-01 MED ORDER — KETOROLAC TROMETHAMINE 30 MG/ML IJ SOLN
INTRAMUSCULAR | Status: AC
  Filled 2016-08-01: qty 2

## 2016-08-01 MED ORDER — 0.9 % SODIUM CHLORIDE (POUR BTL) OPTIME
TOPICAL | Status: DC | PRN
  Administered 2016-08-01: 1000 mL

## 2016-08-01 MED ORDER — DEXAMETHASONE SODIUM PHOSPHATE 10 MG/ML IJ SOLN
10.0000 mg | Freq: Once | INTRAMUSCULAR | Status: AC
  Administered 2016-08-02: 10 mg via INTRAVENOUS
  Filled 2016-08-01: qty 1

## 2016-08-01 MED ORDER — BACITRACIN-NEOMYCIN-POLYMYXIN 400-5-5000 EX OINT
TOPICAL_OINTMENT | CUTANEOUS | Status: AC
  Filled 2016-08-01: qty 1

## 2016-08-01 MED ORDER — TIZANIDINE HCL 2 MG PO TABS
6.0000 mg | ORAL_TABLET | Freq: Three times a day (TID) | ORAL | Status: DC | PRN
Start: 2016-08-01 — End: 2016-08-03
  Administered 2016-08-01 – 2016-08-03 (×4): 6 mg via ORAL
  Filled 2016-08-01 (×7): qty 3

## 2016-08-01 MED ORDER — LACTATED RINGERS IV SOLN
INTRAVENOUS | Status: DC
  Administered 2016-08-01 – 2016-08-02 (×2): via INTRAVENOUS

## 2016-08-01 MED ORDER — POLYETHYLENE GLYCOL 3350 17 G PO PACK: 17.0000 g | PACK | Freq: Every day | ORAL | Status: DC | PRN

## 2016-08-01 MED ORDER — ALBUTEROL SULFATE HFA 108 (90 BASE) MCG/ACT IN AERS: 2.0000 | INHALATION_SPRAY | RESPIRATORY_TRACT | Status: DC | PRN

## 2016-08-01 MED ORDER — FENTANYL CITRATE (PF) 100 MCG/2ML IJ SOLN
INTRAMUSCULAR | Status: AC
  Filled 2016-08-01: qty 2

## 2016-08-01 MED ORDER — ALBUTEROL SULFATE (2.5 MG/3ML) 0.083% IN NEBU
2.5000 mg | INHALATION_SOLUTION | Freq: Once | RESPIRATORY_TRACT | Status: AC
  Administered 2016-08-01: 2.5 mg via RESPIRATORY_TRACT
  Filled 2016-08-01: qty 3

## 2016-08-01 MED ORDER — DARUNAVIR ETHANOLATE 800 MG PO TABS
800.0000 mg | ORAL_TABLET | Freq: Every day | ORAL | Status: DC
  Administered 2016-08-01 – 2016-08-02 (×2): 800 mg via ORAL
  Filled 2016-08-01 (×3): qty 1

## 2016-08-01 MED ORDER — MONTELUKAST SODIUM 10 MG PO TABS
10.0000 mg | ORAL_TABLET | Freq: Every day | ORAL | Status: DC
  Administered 2016-08-01 – 2016-08-02 (×2): 10 mg via ORAL
  Filled 2016-08-01 (×2): qty 1

## 2016-08-01 MED ORDER — DOCUSATE SODIUM 100 MG PO CAPS
100.0000 mg | ORAL_CAPSULE | Freq: Two times a day (BID) | ORAL | Status: DC
  Administered 2016-08-01 – 2016-08-03 (×4): 100 mg via ORAL
  Filled 2016-08-01 (×4): qty 1

## 2016-08-01 MED ORDER — MOMETASONE FUROATE 50 MCG/ACT NA SUSP
1.0000 | Freq: Two times a day (BID) | NASAL | Status: DC
  Filled 2016-08-01: qty 17

## 2016-08-01 MED ORDER — RALTEGRAVIR POTASSIUM 400 MG PO TABS
400.0000 mg | ORAL_TABLET | Freq: Two times a day (BID) | ORAL | Status: DC
  Administered 2016-08-01 – 2016-08-03 (×4): 400 mg via ORAL
  Filled 2016-08-01 (×4): qty 1

## 2016-08-01 MED ORDER — METHOCARBAMOL 500 MG PO TABS
500.0000 mg | ORAL_TABLET | Freq: Four times a day (QID) | ORAL | Status: DC | PRN
  Filled 2016-08-01: qty 1

## 2016-08-01 MED ORDER — METOCLOPRAMIDE HCL 5 MG PO TABS: 5.0000 mg | ORAL_TABLET | Freq: Three times a day (TID) | ORAL | Status: DC | PRN

## 2016-08-01 MED ORDER — ARTIFICIAL TEARS OP OINT
TOPICAL_OINTMENT | OPHTHALMIC | Status: AC
  Filled 2016-08-01: qty 3.5

## 2016-08-01 MED ORDER — EMTRICITABINE-TENOFOVIR AF 200-25 MG PO TABS
1.0000 | ORAL_TABLET | Freq: Every day | ORAL | Status: DC
  Filled 2016-08-01: qty 1

## 2016-08-01 MED ORDER — RITONAVIR 100 MG PO TABS
100.0000 mg | ORAL_TABLET | Freq: Every day | ORAL | Status: DC
  Filled 2016-08-01: qty 1

## 2016-08-01 MED ORDER — FENTANYL CITRATE (PF) 100 MCG/2ML IJ SOLN
50.0000 ug | Freq: Once | INTRAMUSCULAR | Status: AC
  Administered 2016-08-01: 50 ug via INTRAVENOUS

## 2016-08-01 MED ORDER — PROPOFOL 10 MG/ML IV BOLUS
INTRAVENOUS | Status: DC | PRN
  Administered 2016-08-01: 20 mg via INTRAVENOUS
  Administered 2016-08-01: 50 mg via INTRAVENOUS
  Administered 2016-08-01: 30 mg via INTRAVENOUS
  Administered 2016-08-01: 200 mg via INTRAVENOUS

## 2016-08-01 MED ORDER — RALTEGRAVIR POTASSIUM 400 MG PO TABS
400.0000 mg | ORAL_TABLET | Freq: Two times a day (BID) | ORAL | Status: DC
  Filled 2016-08-01: qty 1

## 2016-08-01 MED ORDER — BUPIVACAINE HCL (PF) 0.25 % IJ SOLN
INTRAMUSCULAR | Status: AC
  Filled 2016-08-01: qty 30

## 2016-08-01 MED ORDER — MUPIROCIN 2 % EX OINT
TOPICAL_OINTMENT | Freq: Every day | CUTANEOUS | Status: DC
Start: 2016-08-01 — End: 2016-08-03
  Administered 2016-08-01 – 2016-08-03 (×3): via TOPICAL
  Filled 2016-08-01 (×2): qty 22

## 2016-08-01 MED ORDER — MIDAZOLAM HCL 2 MG/2ML IJ SOLN
1.0000 mg | Freq: Once | INTRAMUSCULAR | Status: AC
  Administered 2016-08-01: 1 mg via INTRAVENOUS

## 2016-08-01 MED ORDER — ROPIVACAINE HCL 7.5 MG/ML IJ SOLN
INTRAMUSCULAR | Status: DC | PRN
  Administered 2016-08-01: 20 mL via PERINEURAL

## 2016-08-01 MED ORDER — ENOXAPARIN SODIUM 40 MG/0.4ML ~~LOC~~ SOLN: 40.0000 mg | SUBCUTANEOUS | 0 refills | Status: DC

## 2016-08-01 MED ORDER — BUPIVACAINE HCL (PF) 0.25 % IJ SOLN
INTRAMUSCULAR | Status: DC | PRN
  Administered 2016-08-01: 30 mL

## 2016-08-01 MED ORDER — PANTOPRAZOLE SODIUM 40 MG PO TBEC
40.0000 mg | DELAYED_RELEASE_TABLET | Freq: Every day | ORAL | Status: DC
Start: 2016-08-01 — End: 2016-08-03
  Administered 2016-08-01 – 2016-08-02 (×2): 40 mg via ORAL
  Filled 2016-08-01 (×2): qty 1

## 2016-08-01 MED ORDER — LACTATED RINGERS IV SOLN
INTRAVENOUS | Status: DC | PRN
  Administered 2016-08-01 (×2): via INTRAVENOUS

## 2016-08-01 MED ORDER — ALBUTEROL SULFATE (2.5 MG/3ML) 0.083% IN NEBU
2.5000 mg | INHALATION_SOLUTION | Freq: Four times a day (QID) | RESPIRATORY_TRACT | Status: DC | PRN
  Administered 2016-08-02 – 2016-08-03 (×3): 2.5 mg via RESPIRATORY_TRACT
  Filled 2016-08-01 (×3): qty 3

## 2016-08-01 MED ORDER — CLINDAMYCIN PHOSPHATE 900 MG/50ML IV SOLN: 900.0000 mg | Freq: Once | INTRAVENOUS | Status: DC

## 2016-08-01 MED ORDER — ELVITEG-COBIC-EMTRICIT-TENOFAF 150-150-200-10 MG PO TABS
1.0000 | ORAL_TABLET | Freq: Every day | ORAL | Status: DC
  Filled 2016-08-01: qty 1

## 2016-08-01 MED ORDER — ONDANSETRON HCL 4 MG/2ML IJ SOLN
INTRAMUSCULAR | Status: DC | PRN
  Administered 2016-08-01: 4 mg via INTRAVENOUS

## 2016-08-01 MED ORDER — OXYCODONE HCL 5 MG PO TABS
5.0000 mg | ORAL_TABLET | ORAL | Status: DC | PRN
  Administered 2016-08-01 – 2016-08-03 (×12): 10 mg via ORAL
  Filled 2016-08-01 (×12): qty 2

## 2016-08-01 MED ORDER — GABAPENTIN 300 MG PO CAPS
300.0000 mg | ORAL_CAPSULE | Freq: Three times a day (TID) | ORAL | Status: DC | PRN
  Administered 2016-08-01 – 2016-08-03 (×4): 300 mg via ORAL
  Filled 2016-08-01 (×4): qty 1

## 2016-08-01 MED ORDER — MIDAZOLAM HCL 2 MG/2ML IJ SOLN
INTRAMUSCULAR | Status: AC
  Filled 2016-08-01: qty 2

## 2016-08-01 MED ORDER — DIPHENHYDRAMINE HCL 12.5 MG/5ML PO ELIX
12.5000 mg | ORAL_SOLUTION | ORAL | Status: DC | PRN
  Administered 2016-08-02: 25 mg via ORAL
  Filled 2016-08-01: qty 10

## 2016-08-01 MED ORDER — PHENOL 1.4 % MT LIQD: 1.0000 | OROMUCOSAL | Status: DC | PRN

## 2016-08-01 MED ORDER — FUROSEMIDE 20 MG PO TABS: 20.0000 mg | ORAL_TABLET | Freq: Every day | ORAL | Status: DC

## 2016-08-01 MED ORDER — ONDANSETRON HCL 4 MG/2ML IJ SOLN
4.0000 mg | Freq: Four times a day (QID) | INTRAMUSCULAR | Status: DC | PRN
  Administered 2016-08-01 – 2016-08-02 (×3): 4 mg via INTRAVENOUS
  Filled 2016-08-01 (×3): qty 2

## 2016-08-01 MED ORDER — CHLORHEXIDINE GLUCONATE 4 % EX LIQD: 60.0000 mL | Freq: Once | CUTANEOUS | Status: DC

## 2016-08-01 MED ORDER — DEXAMETHASONE SODIUM PHOSPHATE 10 MG/ML IJ SOLN
INTRAMUSCULAR | Status: DC | PRN
  Administered 2016-08-01: 10 mg via INTRAVENOUS

## 2016-08-01 MED ORDER — MIDAZOLAM HCL 5 MG/5ML IJ SOLN
INTRAMUSCULAR | Status: DC | PRN
  Administered 2016-08-01: 2 mg via INTRAVENOUS

## 2016-08-01 MED ORDER — LIDOCAINE HCL (CARDIAC) 20 MG/ML IV SOLN
INTRAVENOUS | Status: DC | PRN
  Administered 2016-08-01: 20 mg via INTRAVENOUS

## 2016-08-01 MED ORDER — ZOLPIDEM TARTRATE 5 MG PO TABS
5.0000 mg | ORAL_TABLET | Freq: Every evening | ORAL | Status: DC | PRN
  Administered 2016-08-02: 5 mg via ORAL
  Filled 2016-08-01: qty 1

## 2016-08-01 MED ORDER — RITONAVIR 100 MG PO TABS
100.0000 mg | ORAL_TABLET | Freq: Every day | ORAL | Status: DC
  Administered 2016-08-01 – 2016-08-02 (×2): 100 mg via ORAL
  Filled 2016-08-01 (×2): qty 1

## 2016-08-01 MED ORDER — IPRATROPIUM BROMIDE 0.02 % IN SOLN: 0.5000 mg | Freq: Four times a day (QID) | RESPIRATORY_TRACT | Status: DC | PRN

## 2016-08-01 MED ORDER — SODIUM CHLORIDE 0.9 % IR SOLN
Status: DC | PRN
  Administered 2016-08-01: 3000 mL

## 2016-08-01 MED ORDER — PROMETHAZINE HCL 25 MG/ML IJ SOLN: 6.2500 mg | INTRAMUSCULAR | Status: DC | PRN

## 2016-08-01 MED ORDER — SODIUM CHLORIDE FLUSH 0.9 % IV SOLN
INTRAVENOUS | Status: DC | PRN
  Administered 2016-08-01: 30 mL

## 2016-08-01 MED ORDER — ONDANSETRON HCL 4 MG PO TABS: 4.0000 mg | ORAL_TABLET | Freq: Four times a day (QID) | ORAL | Status: DC | PRN

## 2016-08-01 MED ORDER — POLYVINYL ALCOHOL 1.4 % OP SOLN
1.0000 [drp] | Freq: Every day | OPHTHALMIC | Status: DC
  Filled 2016-08-01: qty 15

## 2016-08-01 MED ORDER — CLINDAMYCIN PHOSPHATE 600 MG/50ML IV SOLN
900.0000 mg | INTRAVENOUS | Status: AC
  Administered 2016-08-01: 900 mg via INTRAVENOUS
  Filled 2016-08-01: qty 100

## 2016-08-01 MED ORDER — CLINDAMYCIN PHOSPHATE 600 MG/50ML IV SOLN
600.0000 mg | Freq: Four times a day (QID) | INTRAVENOUS | Status: AC
  Administered 2016-08-01 (×2): 600 mg via INTRAVENOUS
  Filled 2016-08-01 (×4): qty 50

## 2016-08-01 MED ORDER — HYDROCHLOROTHIAZIDE 25 MG PO TABS
25.0000 mg | ORAL_TABLET | Freq: Every day | ORAL | Status: DC
  Administered 2016-08-01: 25 mg via ORAL
  Filled 2016-08-01 (×2): qty 1

## 2016-08-01 MED ORDER — AMLODIPINE BESYLATE 5 MG PO TABS
5.0000 mg | ORAL_TABLET | Freq: Every day | ORAL | Status: DC
  Administered 2016-08-01 – 2016-08-02 (×2): 5 mg via ORAL
  Filled 2016-08-01 (×2): qty 1

## 2016-08-01 MED ORDER — ACETAMINOPHEN 650 MG RE SUPP: 650.0000 mg | Freq: Four times a day (QID) | RECTAL | Status: DC | PRN

## 2016-08-01 MED ORDER — METOCLOPRAMIDE HCL 5 MG/ML IJ SOLN
5.0000 mg | Freq: Three times a day (TID) | INTRAMUSCULAR | Status: DC | PRN
  Administered 2016-08-01 – 2016-08-02 (×2): 10 mg via INTRAVENOUS
  Filled 2016-08-01 (×2): qty 2

## 2016-08-01 MED ORDER — MENTHOL 3 MG MT LOZG: 1.0000 | LOZENGE | OROMUCOSAL | Status: DC | PRN

## 2016-08-01 MED ORDER — HYDROXYZINE HCL 25 MG PO TABS: 25.0000 mg | ORAL_TABLET | Freq: Four times a day (QID) | ORAL | Status: DC | PRN

## 2016-08-01 MED ORDER — ONDANSETRON HCL 4 MG/2ML IJ SOLN
INTRAMUSCULAR | Status: AC
  Filled 2016-08-01: qty 4

## 2016-08-01 MED ORDER — LACTATED RINGERS IV SOLN
INTRAVENOUS | Status: DC
  Administered 2016-08-01: 09:00:00 via INTRAVENOUS

## 2016-08-01 MED ORDER — DEXAMETHASONE SODIUM PHOSPHATE 10 MG/ML IJ SOLN
INTRAMUSCULAR | Status: AC
  Filled 2016-08-01: qty 1

## 2016-08-01 MED ORDER — ACETAMINOPHEN 325 MG PO TABS
650.0000 mg | ORAL_TABLET | Freq: Four times a day (QID) | ORAL | Status: DC | PRN
  Filled 2016-08-01: qty 2

## 2016-08-01 MED ORDER — SUMATRIPTAN SUCCINATE 100 MG PO TABS
100.0000 mg | ORAL_TABLET | ORAL | Status: DC | PRN
  Administered 2016-08-01 – 2016-08-02 (×3): 100 mg via ORAL
  Filled 2016-08-01 (×5): qty 1

## 2016-08-01 MED ORDER — SORBITOL 70 % SOLN: 30.0000 mL | Freq: Every day | Status: DC | PRN

## 2016-08-01 MED ORDER — SENNA 8.6 MG PO TABS
1.0000 | ORAL_TABLET | Freq: Two times a day (BID) | ORAL | Status: DC
  Administered 2016-08-01 – 2016-08-03 (×4): 8.6 mg via ORAL
  Filled 2016-08-01 (×4): qty 1

## 2016-08-01 MED ORDER — MOMETASONE FURO-FORMOTEROL FUM 200-5 MCG/ACT IN AERO
1.0000 | INHALATION_SPRAY | Freq: Two times a day (BID) | RESPIRATORY_TRACT | Status: DC
  Administered 2016-08-01 – 2016-08-03 (×3): 1 via RESPIRATORY_TRACT
  Filled 2016-08-01 (×2): qty 8.8

## 2016-08-01 MED ORDER — ONDANSETRON HCL 4 MG PO TABS: 4.0000 mg | ORAL_TABLET | Freq: Three times a day (TID) | ORAL | 0 refills | Status: DC | PRN

## 2016-08-01 SURGICAL SUPPLY — 61 items
ADH SKN CLS APL DERMABOND .7 (GAUZE/BANDAGES/DRESSINGS)
BANDAGE ESMARK 6X9 LF (GAUZE/BANDAGES/DRESSINGS) ×1 IMPLANT
BLADE SAG 18X100X1.27 (BLADE) ×2 IMPLANT
BNDG CMPR 9X6 STRL LF SNTH (GAUZE/BANDAGES/DRESSINGS) ×1
BNDG CMPR MED 10X6 ELC LF (GAUZE/BANDAGES/DRESSINGS) ×1
BNDG COHESIVE 6X5 TAN STRL LF (GAUZE/BANDAGES/DRESSINGS) ×2 IMPLANT
BNDG ELASTIC 6X10 VLCR STRL LF (GAUZE/BANDAGES/DRESSINGS) ×1 IMPLANT
BNDG ESMARK 6X9 LF (GAUZE/BANDAGES/DRESSINGS) ×2
BOWL SMART MIX CTS (DISPOSABLE) ×2 IMPLANT
CAPT KNEE TRIATH TK-4 ×1 IMPLANT
CLSR STERI-STRIP ANTIMIC 1/2X4 (GAUZE/BANDAGES/DRESSINGS) ×4 IMPLANT
COVER SURGICAL LIGHT HANDLE (MISCELLANEOUS) ×2 IMPLANT
CUFF TOURNIQUET SINGLE 34IN LL (TOURNIQUET CUFF) ×2 IMPLANT
DERMABOND ADVANCED (GAUZE/BANDAGES/DRESSINGS)
DERMABOND ADVANCED .7 DNX12 (GAUZE/BANDAGES/DRESSINGS) IMPLANT
DRAPE HALF SHEET 40X57 (DRAPES) ×2 IMPLANT
DRAPE U-SHAPE 47X51 STRL (DRAPES) ×2 IMPLANT
DRSG ADAPTIC 3X8 NADH LF (GAUZE/BANDAGES/DRESSINGS) ×2 IMPLANT
DRSG MEPILEX BORDER 4X8 (GAUZE/BANDAGES/DRESSINGS) ×2 IMPLANT
DURAPREP 26ML APPLICATOR (WOUND CARE) ×4 IMPLANT
ELECT CAUTERY BLADE 6.4 (BLADE) ×2 IMPLANT
ELECT REM PT RETURN 9FT ADLT (ELECTROSURGICAL) ×2
ELECTRODE REM PT RTRN 9FT ADLT (ELECTROSURGICAL) ×1 IMPLANT
FACESHIELD WRAPAROUND (MASK) ×4 IMPLANT
FACESHIELD WRAPAROUND OR TEAM (MASK) ×2 IMPLANT
GAUZE SPONGE 4X4 12PLY STRL (GAUZE/BANDAGES/DRESSINGS) ×2 IMPLANT
GLOVE BIO SURGEON STRL SZ7.5 (GLOVE) ×4 IMPLANT
GLOVE BIOGEL PI IND STRL 8 (GLOVE) ×2 IMPLANT
GLOVE BIOGEL PI INDICATOR 8 (GLOVE) ×2
GOWN STRL REUS W/ TWL LRG LVL3 (GOWN DISPOSABLE) ×2 IMPLANT
GOWN STRL REUS W/ TWL XL LVL3 (GOWN DISPOSABLE) ×1 IMPLANT
GOWN STRL REUS W/TWL LRG LVL3 (GOWN DISPOSABLE) ×4
GOWN STRL REUS W/TWL XL LVL3 (GOWN DISPOSABLE) ×2
HANDPIECE INTERPULSE COAX TIP (DISPOSABLE)
IMMOBILIZER KNEE 22 UNIV (SOFTGOODS) ×2 IMPLANT
IMMOBILIZER KNEE 24 THIGH 36 (MISCELLANEOUS) IMPLANT
IMMOBILIZER KNEE 24 UNIV (MISCELLANEOUS)
KIT BASIN OR (CUSTOM PROCEDURE TRAY) ×2 IMPLANT
KIT ROOM TURNOVER OR (KITS) ×2 IMPLANT
MANIFOLD NEPTUNE II (INSTRUMENTS) ×2 IMPLANT
NDL 18GX1X1/2 (RX/OR ONLY) (NEEDLE) ×1 IMPLANT
NEEDLE 18GX1X1/2 (RX/OR ONLY) (NEEDLE) ×2 IMPLANT
NS IRRIG 1000ML POUR BTL (IV SOLUTION) ×2 IMPLANT
PACK TOTAL JOINT (CUSTOM PROCEDURE TRAY) ×2 IMPLANT
PACK UNIVERSAL I (CUSTOM PROCEDURE TRAY) ×2 IMPLANT
PAD ARMBOARD 7.5X6 YLW CONV (MISCELLANEOUS) ×2 IMPLANT
SET HNDPC FAN SPRY TIP SCT (DISPOSABLE) IMPLANT
STAPLER VISISTAT 35W (STAPLE) IMPLANT
SUCTION FRAZIER HANDLE 10FR (MISCELLANEOUS) ×1
SUCTION TUBE FRAZIER 10FR DISP (MISCELLANEOUS) ×1 IMPLANT
SUT MNCRL AB 4-0 PS2 18 (SUTURE) ×2 IMPLANT
SUT MON AB 2-0 CT1 27 (SUTURE) ×4 IMPLANT
SUT VIC AB 0 CT1 27 (SUTURE) ×2
SUT VIC AB 0 CT1 27XBRD ANBCTR (SUTURE) ×1 IMPLANT
SUT VIC AB 1 CT1 27 (SUTURE) ×4
SUT VIC AB 1 CT1 27XBRD ANBCTR (SUTURE) ×2 IMPLANT
SYR 50ML LL SCALE MARK (SYRINGE) ×2 IMPLANT
TOWEL OR 17X24 6PK STRL BLUE (TOWEL DISPOSABLE) ×2 IMPLANT
TOWEL OR 17X26 10 PK STRL BLUE (TOWEL DISPOSABLE) ×2 IMPLANT
TRAY CATH 16FR W/PLASTIC CATH (SET/KITS/TRAYS/PACK) IMPLANT
TRAY FOLEY BAG SILVER LF 14FR (CATHETERS) IMPLANT

## 2016-08-01 NOTE — Progress Notes (Addendum)
Small raised area noted to right thigh. Dr Ola Spurr informed Dr Percell Miller. Dr Percell Miller in to assess area. Pt states it has been there for a few years. No redness or drainage noted. Pt c/o itching where op site is on left hand, Dr Ola Spurr informed.

## 2016-08-01 NOTE — Progress Notes (Signed)
Orthopedic Tech Progress Note Patient Details:  MARSHAUN MARRESE 01/01/1961 [id number]  CPM Right Knee CPM Right Knee: On Right Knee Flexion (Degrees): 90 Right Knee Extension (Degrees): 0 Additional Comments: Applied CPM at 0-90 on Right Leg/Knee.  Pt tolerated well.  Provided Bone foam zero degree for Right Knee/leg.   Kristopher Oppenheim 08/01/2016, 7:04 PM Pt Placed in CPM 0-90 at time: 1850

## 2016-08-01 NOTE — Transfer of Care (Signed)
Immediate Anesthesia Transfer of Care Note  Patient: Casey Brennan  Procedure(s) Performed: Procedure(s): TOTAL KNEE ARTHROPLASTY (Right)  Patient Location: PACU  Anesthesia Type:General, Regional and GA combined with regional for post-op pain  Level of Consciousness: awake and oriented  Airway & Oxygen Therapy: Patient Spontanous Breathing and Patient connected to face mask oxygen  Post-op Assessment: Report given to RN, Post -op Vital signs reviewed and stable, Patient moving all extremities and Patient moving all extremities X 4  Post vital signs: Reviewed and stable  Last Vitals:  Vitals:   08/01/16 0945 08/01/16 1247  BP: 122/76   Pulse: 85   Resp: 14   Temp:  36.3 C    Last Pain:  Vitals:   08/01/16 1247  TempSrc:   PainSc: 0-No pain      Patients Stated Pain Goal: 3 ([id number])  Complications: No apparent anesthesia complications

## 2016-08-01 NOTE — Progress Notes (Signed)
Orthopedic Tech Progress Note Patient Details:  JACIN DUKES [birth year] [id number]  Provided Overhead Frame with trapeze bar.  As order.   Kristopher Oppenheim 08/01/2016, 4:32 PM

## 2016-08-01 NOTE — Progress Notes (Signed)
Orthopedic Tech Progress Note Patient Details:  BENJERMAN BOEHRINGER 05-28-1961 [id number]  CPM Right Knee CPM Right Knee: On Right Knee Flexion (Degrees): 90 Right Knee Extension (Degrees): 0 Additional Comments: Applied CPM at 0-90 on Right Leg/Knee.  Pt tolerated well.  Provided Bone foam zero degree for Right Knee/leg.   Kristopher Oppenheim 08/01/2016, 2:11 PM

## 2016-08-01 NOTE — Discharge Instructions (Signed)

## 2016-08-01 NOTE — Anesthesia Procedure Notes (Addendum)
Anesthesia Regional Block:  Adductor canal block  Pre-Anesthetic Checklist: ,, timeout performed, Correct Patient, Correct Site, Correct Laterality, Correct Procedure, Correct Position, site marked, Risks and benefits discussed,  Surgical consent,  Pre-op evaluation,  At surgeon's request and post-op pain management  Laterality: Right  Prep: chloraprep       Needles:  Injection technique: Single-shot  Needle Type: Echogenic Needle     Needle Length: 9cm 9 cm Needle Gauge: 21 and 21 G    Additional Needles:  Procedures: ultrasound guided (picture in chart) Adductor canal block Narrative:  Start time: 08/01/2016 9:24 AM End time: 08/01/2016 9:31 AM Injection made incrementally with aspirations every 5 mL.  Performed by: Personally  Anesthesiologist: Suzette Battiest

## 2016-08-01 NOTE — Anesthesia Preprocedure Evaluation (Addendum)
Anesthesia Evaluation  Patient identified by MRN, date of birth, ID band Patient awake    Reviewed: Allergy & Precautions, NPO status , Patient's Chart, lab work & pertinent test results  Airway Mallampati: II  TM Distance: >3 FB Neck ROM: Full    Dental  (+) Dental Advisory Given   Pulmonary asthma , COPD,  COPD inhaler, former smoker,    breath sounds clear to auscultation       Cardiovascular hypertension, Pt. on medications  Rhythm:Regular Rate:Normal     Neuro/Psych  Headaches,  Neuromuscular disease    GI/Hepatic Neg liver ROS, GERD  ,  Endo/Other  negative endocrine ROS  Renal/GU CRFRenal disease     Musculoskeletal  (+) Arthritis ,   Abdominal   Peds  Hematology  (+) anemia , HIV,   Anesthesia Other Findings   Reproductive/Obstetrics                            Lab Results  Component Value Date   WBC 7.3 07/21/2016   HGB 12.7 (L) 07/21/2016   HCT 37.8 (L) 07/21/2016   MCV 95.2 07/21/2016   PLT 180 07/21/2016   Lab Results  Component Value Date   CREATININE 1.32 (H) 07/21/2016   BUN 13 07/21/2016   NA 137 07/21/2016   K 3.5 07/21/2016   CL 101 07/21/2016   CO2 27 07/21/2016   Lab Results  Component Value Date   INR 0.92 04/17/2013   INR 1.02 02/28/2013   INR 0.96 10/11/2012    Anesthesia Physical Anesthesia Plan  ASA: III  Anesthesia Plan: General   Post-op Pain Management:  Regional for Post-op pain   Induction: Intravenous  Airway Management Planned: LMA  Additional Equipment:   Intra-op Plan:   Post-operative Plan: Extubation in OR  Informed Consent: I have reviewed the patients History and Physical, chart, labs and discussed the procedure including the risks, benefits and alternatives for the proposed anesthesia with the patient or authorized representative who has indicated his/her understanding and acceptance.   Dental advisory given  Plan  Discussed with: CRNA  Anesthesia Plan Comments: (Pt declines spinal due to bad experience in the past.)       Anesthesia Quick Evaluation

## 2016-08-01 NOTE — Anesthesia Procedure Notes (Signed)
Procedure Name: LMA Insertion Date/Time: 08/01/2016 10:30 AM Performed by: Jacquiline Doe A Pre-anesthesia Checklist: Patient identified, Emergency Drugs available, Suction available and Patient being monitored Patient Re-evaluated:Patient Re-evaluated prior to inductionOxygen Delivery Method: Circle System Utilized and Circle system utilized Preoxygenation: Pre-oxygenation with 100% oxygen Intubation Type: IV induction Ventilation: Mask ventilation without difficulty LMA: LMA with gastric port inserted LMA Size: 4.0 Tube type: Oral Number of attempts: 1 Airway Equipment and Method: Bite block Placement Confirmation: positive ETCO2 Tube secured with: Tape Dental Injury: Teeth and Oropharynx as per pre-operative assessment

## 2016-08-01 NOTE — Interval H&P Note (Signed)
History and Physical Interval Note:  08/01/2016 8:59 AM  Casey Brennan  has presented today for surgery, with the diagnosis of OA RIGHT KNEE  The various methods of treatment have been discussed with the patient and family. After consideration of risks, benefits and other options for treatment, the patient has consented to  Procedure(s): TOTAL KNEE ARTHROPLASTY (Right) as a surgical intervention .  The patient's history has been reviewed, patient examined, no change in status, stable for surgery.  I have reviewed the patient's chart and labs.  Questions were answered to the patient's satisfaction.     [name] D

## 2016-08-01 NOTE — Progress Notes (Signed)
C/o itching going up his arm Dr Percell Miller called after stopping vancomycin. States to let Anesthesia know. Cyndie Chime, CRNA informed and states he will talk to Dr Ola Spurr.

## 2016-08-01 NOTE — Anesthesia Postprocedure Evaluation (Signed)
Anesthesia Post Note  Patient: [name]  Procedure(s) Performed: Procedure(s) (LRB): TOTAL KNEE ARTHROPLASTY (Right)  Patient location during evaluation: PACU Anesthesia Type: General Level of consciousness: awake and alert Pain management: pain level controlled Vital Signs Assessment: post-procedure vital signs reviewed and stable Respiratory status: spontaneous breathing, nonlabored ventilation, respiratory function stable and patient connected to nasal cannula oxygen Cardiovascular status: blood pressure returned to baseline and stable Postop Assessment: no signs of nausea or vomiting Anesthetic complications: no       Last Vitals:  Vitals:   08/01/16 1347 08/01/16 1402  BP: (!) 134/95 132/83  Pulse: 85 88  Resp: (!) 8 11  Temp:      Last Pain:  Vitals:   08/01/16 1350  TempSrc:   PainSc: Tyler Deis

## 2016-08-02 ENCOUNTER — Encounter (HOSPITAL_COMMUNITY): Payer: Self-pay | Admitting: General Practice

## 2016-08-02 MED ORDER — FLUTICASONE PROPIONATE 50 MCG/ACT NA SUSP
2.0000 | Freq: Every day | NASAL | Status: DC
  Filled 2016-08-02: qty 16

## 2016-08-02 MED ORDER — HYDROMORPHONE HCL 2 MG PO TABS: 2.0000 mg | ORAL_TABLET | Freq: Two times a day (BID) | ORAL | 0 refills | Status: DC | PRN

## 2016-08-02 MED ORDER — HYDROCOD POLST-CPM POLST ER 10-8 MG/5ML PO SUER
5.0000 mL | Freq: Two times a day (BID) | ORAL | Status: DC | PRN
  Administered 2016-08-02 – 2016-08-03 (×2): 5 mL via ORAL
  Filled 2016-08-02 (×2): qty 5

## 2016-08-02 MED ORDER — MOMETASONE FUROATE 50 MCG/ACT NA SUSP
2.0000 | Freq: Every day | NASAL | Status: DC
  Administered 2016-08-02 – 2016-08-03 (×2): 2 via NASAL
  Filled 2016-08-02: qty 17

## 2016-08-02 MED ORDER — BECLOMETHASONE DIPROP MONOHYD 42 MCG/SPRAY NA SUSP
1.0000 | Freq: Two times a day (BID) | NASAL | Status: DC
Start: 2016-08-02 — End: 2016-08-02

## 2016-08-02 MED ORDER — BECLOMETHASONE DIPROP MONOHYD 42 MCG/SPRAY NA SUSP: 2.0000 | Freq: Two times a day (BID) | NASAL | Status: DC

## 2016-08-02 MED ORDER — BECLOMETHASONE DIPROP MONOHYD 42 MCG/SPRAY NA SUSP
2.0000 | Freq: Two times a day (BID) | NASAL | Status: DC
  Filled 2016-08-02: qty 25

## 2016-08-02 NOTE — Care Management Note (Signed)
Case Management Note  Patient Details  Name: CAIDAN HOGANCAMP MRN: [id number] Date of Birth: Jul 31, 1960  Subjective/Objective:    56 yr old gentleman s/p right total knee arthroplasty.                Action/Plan: Case manager spoke with patient and his sister, April, concerning [place] and DME needs. Patient was preoperatively setup with [facility], no changes. CPM has been delivered to his home. Case manager has ordered RW and 3in1. He will have family support at discharge.    Expected Discharge Date:   08/03/16               Expected Discharge Plan:  [place]  In-House Referral:  NA  Discharge planning Services  CM Consult  Post Acute Care Choice:  Durable Medical Equipment, Home Health Choice offered to:  Patient  DME Arranged:  3-N-1, Walker rolling DME Agency:  [place]:  PT [place] Agency:  [facility] (formerly [facility])  Status of Service:  Completed, signed off  If discussed at [employer], dates discussed:    Additional Comments:  Ninfa Meeker, RN 08/02/2016, 9:59 AM

## 2016-08-02 NOTE — Progress Notes (Signed)
   Assessment: 1 Day Post-Op  S/P Procedure(s) (LRB): TOTAL KNEE ARTHROPLASTY (Right) by Dr. Ernesta Amble. Percell Miller on 08/01/16  Principal Problem:   Osteoarthritis of right knee Active Problems:   Human immunodeficiency virus (HIV) disease ([place])   HTN (hypertension)   Asthma   H/O prostate cancer   COPD GOLD III   GERD (gastroesophageal reflux disease)   Low back pain   Chronic renal insufficiency, stage 2 (mild)   Primary osteoarthritis of right knee  Plan: Advance diet Up with therapy D/C IV fluids Elevate leg Apply Ice Incentive Spirometry  Weight Bearing: Weight Bearing as Tolerated (WBAT)  Dressings: prn.  VTE prophylaxis: Lovenox, SCDs, ambulation Dispo: Home w/ HHPT tomorrow.  Later today possible pending patient progress and PT evaluation.  Subjective: Patient reports pain as moderate. Pain controlled with IV and PO meds.  Tolerating diet.  Urinating.  +Flatus.  No CP, SOB.   OOB in room.  Objective:   VITALS:   Vitals:   08/01/16 1927 08/01/16 2015 08/02/16 0052 08/02/16 0657  BP:  139/77 117/72 130/81  Pulse:  91 74 79  Resp:    18  Temp:  97.8 F (36.6 C) 98.2 F (36.8 C) 98.4 F (36.9 C)  TempSrc:  Oral Oral Oral  SpO2: 98% 97% 97%   Weight:      Height:       CBC Latest Ref Rng & Units 07/21/2016 04/20/2016 10/07/2015  WBC 4.0 - 10.5 K/uL 7.3 7.2 5.5  Hemoglobin 13.0 - 17.0 g/dL 12.7(L) 15.2 13.4  Hematocrit 39.0 - 52.0 % 37.8(L) 44.5 40.3  Platelets 150 - 400 K/uL 180 245 135(L)   BMP Latest Ref Rng & Units 07/21/2016 04/20/2016 10/07/2015  Glucose 65 - 99 mg/dL 97 102(H) 102(H)  BUN 6 - 20 mg/dL 13 16 6   Creatinine 0.61 - 1.24 mg/dL 1.32(H) 1.38(H) 1.24  Sodium 135 - 145 mmol/L 137 136 139  Potassium 3.5 - 5.1 mmol/L 3.5 3.8 3.7  Chloride 101 - 111 mmol/L 101 99 104  CO2 22 - 32 mmol/L 27 28 25   Calcium 8.9 - 10.3 mg/dL 9.6 9.7 9.1   Intake/Output      01/30 0701 - 01/31 0700 01/31 0701 - 02/01 0700   P.O. 240    I.V. (mL/kg) 1500 (15.4)    IV Piggyback 50    Total Intake(mL/kg) 1790 (18.4)    Urine (mL/kg/hr) 1000    Blood 10    Total Output 1010     Net +780            Physical Exam: General: NAD.  Upright in bed.  Family in room Resp: No increased wob Cardio: regular rate and rhythm ABD soft Neurologically intact MSK Neurovascularly intact Sensation intact distally Feet warm Dorsiflexion/Plantar flexion intact Incision: dressing C/D/I   Prudencio Burly III, PA-C 08/02/2016, 7:32 AM

## 2016-08-02 NOTE — Progress Notes (Signed)
Physical Therapy Treatment Patient Details Name: KAVEION NATALE MRN: [id number] DOB: [birth year] Today's Date: 08/02/2016    History of Present Illness ABHIRAJ DONIS is a 56 y.o. male with history of HIV, COPD, GERD, and hypertension who presents for right TKA after failure of conservative treatment to manage right knee OA.    PT Comments    Pt more alert this afternoon, ambulated 100' with RW and min-guard A as well as seated and standing exercises, before return to CPM 0-60 degrees. PT will continue to follow.   Follow Up Recommendations  Home health PT     Equipment Recommendations  Rolling walker with 5" wheels    Recommendations for Other Services       Precautions / Restrictions Precautions Precautions: Knee Precaution Booklet Issued: No Precaution Comments: reviewed no pillow under knee Restrictions Weight Bearing Restrictions: Yes RLE Weight Bearing: Weight bearing as tolerated    Mobility  Bed Mobility Overal bed mobility: Needs Assistance Bed Mobility: Supine to Sit;Sit to Supine     Supine to sit: Supervision Sit to supine: Min assist   General bed mobility comments: pt able to get to EOB without physical assist, min A to RLE for return to bed  Transfers Overall transfer level: Needs assistance Equipment used: Rolling walker (2 wheeled) Transfers: Sit to/from Stand Sit to Stand: Min guard         General transfer comment: vc's to remind of hand placement  Ambulation/Gait Ambulation/Gait assistance: Min guard Ambulation Distance (Feet): 100 Feet Assistive device: Rolling walker (2 wheeled) Gait Pattern/deviations: Step-through pattern;Decreased step length - right;Decreased stance time - right;Decreased weight shift to right Gait velocity: decreased Gait velocity interpretation: Below normal speed for age/gender General Gait Details: working on getting right heel to floor   Stairs            Wheelchair Mobility    Modified Rankin  (Stroke Patients Only)       Balance Overall balance assessment: No apparent balance deficits (not formally assessed) (unble to properly assess)                                  Cognition Arousal/Alertness: Awake/alert Behavior During Therapy: WFL for tasks assessed/performed Overall Cognitive Status: Within Functional Limits for tasks assessed                      Exercises Total Joint Exercises Ankle Circles/Pumps: AROM;Both;20 reps;Supine Heel Slides: AROM;Right;10 reps;Seated Straight Leg Raises: AROM;Right;10 reps;Seated Long Arc Quad: AROM;Right;10 reps;Seated Knee Flexion: AROM;Right;10 reps;Seated Goniometric ROM: 10-85 Marching in Standing: AROM;Right;10 reps;Standing Standing Hip Extension: AROM;Right;10 reps;Standing    General Comments        Pertinent Vitals/Pain Pain Assessment: Faces Faces Pain Scale: Hurts little more Pain Location: right knee Pain Descriptors / Indicators: Sore;Aching Pain Intervention(s): Limited activity within patient's tolerance;Monitored during session;Premedicated before session;Repositioned    Home Living Family/patient expects to be discharged to:: Private residence Living Arrangements: Other relatives (sister) Available Help at Discharge: Family;Available 24 hours/day Type of Home: House Home Access: Stairs to enter Entrance Stairs-Rails: Can reach both;Right;Left Home Layout: One level Home Equipment: None Additional Comments: pt's sister lives with him and will help as needed    Prior Function Level of Independence: Independent          PT Goals (current goals can now be found in the care plan section) Acute Rehab PT Goals Patient Stated Goal:  return home PT Goal Formulation: With patient/family Time For Goal Achievement: 08/09/16 Potential to Achieve Goals: Good Progress towards PT goals: Progressing toward goals    Frequency    7X/week      PT Plan Current plan remains appropriate     Co-evaluation             End of Session Equipment Utilized During Treatment: Gait belt Activity Tolerance: Patient tolerated treatment well Patient left: with call bell/phone within reach;with family/visitor present;in bed;in CPM     Time: [id number] PT Time Calculation (min) (ACUTE ONLY): 24 min  Charges:  $Gait Training: 8-22 mins $Therapeutic Exercise: 8-22 mins                    G Codes:     Leighton Roach, PT  Acute Rehab Services  [place] 08/02/2016, 4:02 PM

## 2016-08-02 NOTE — Progress Notes (Signed)
Orthopedic Tech Progress Note Patient Details:  [name] 06/29/1961 [id number]  Patient ID: Casey Brennan, male   DOB: [birth year], 56 y.o.   MRN: [id number]   Hildred Priest 08/02/2016, 3:08 PM Placed pt's rle on cpm @ 0-60 degrees @1510 ; RN notified

## 2016-08-02 NOTE — Evaluation (Signed)
Occupational Therapy Evaluation Patient Details Name: Casey Brennan MRN: [id number] DOB: September 25, 1960 Today's Date: 08/02/2016    History of Present Illness Casey Brennan is a 56 y.o. male with history of HIV, COPD, GERD, and hypertension who presents for right TKA after failure of conservative treatment to manage right knee OA.   Clinical Impression   Eval limited, attempted x 2 to see pt. Pt participated at bed level. Pt with decline in function and safety with ADLs and ADL mobility with decreased endurance and is limited by pain and fatigue. Pt will have 24/7 assist form his sister April (present). Pt would benefit from acute OT services to address impairments to increase level of function and safety    Follow Up Recommendations  Home health OT;Supervision - Intermittent    Equipment Recommendations  3 in 1 bedside commode;Tub/shower seat;Other (comment) (ADL A/E kit)    Recommendations for Other Services       Precautions / Restrictions Precautions Precautions: Knee Precaution Booklet Issued: No Precaution Comments: reviewed no pillow under knee Restrictions Weight Bearing Restrictions: Yes RLE Weight Bearing: Weight bearing as tolerated      Mobility Bed Mobility Overal bed mobility: Needs Assistance Bed Mobility: Sidelying to Sit;Rolling Rolling: Min guard Sidelying to sit: Min guard       General bed mobility comments: NT, pt very sleepy on first attempt to see pt  this morning, sitting up in bed eating lunuch on second attempt  Transfers Overall transfer level: Needs assistance Equipment used: Rolling walker (2 wheeled) Transfers: Sit to/from Stand Sit to Stand: Min guard         General transfer comment: NT, pt very sleepy on first attempt to see pt  this morning, sitting up in bed eating lunuch on second attempt. Per PT note , pt is min guard A    Balance Overall balance assessment:  (unble to properly assess)                                           ADL Overall ADL's : Needs assistance/impaired     Grooming: Wash/dry hands;Wash/dry face;Set up;Sitting;Bed level   Upper Body Bathing: Supervision/ safety;Set up;Sitting;Bed level   Lower Body Bathing: Maximal assistance;Bed level (simulated)   Upper Body Dressing : Set up;Supervision/safety;Sitting;Bed level   Lower Body Dressing: Bed level;Maximal assistance (simulated)     Toilet Transfer Details (indicate cue type and reason): NT, pt very sleepy on first attempt to see pt  this morning, sitting up in bed eating lunuch on second attempt. Per PT note , pt is min guard A with transfers   Toileting - Clothing Manipulation Details (indicate cue type and reason): NT, pt very sleepy on first attempt to see pt  this morning, sitting up in bed eating lunuch on second attempt   Tub/Shower Transfer Details (indicate cue type and reason): NT, pt very sleepy on first attempt to see pt  this morning, sitting up in bed eating lunuch on second attempt. Per PT note , pt is min guard A with transfers Functional mobility during ADLs:  (NT, pt very sleepy on first attempt to see pt  this morning, sitting up in bed eating lunuch on second attempt. Per PT note , pt is min guard A with transfers) General ADL Comments: Educated pt and his sister (caregiver) on ADL A/E and DME for home use, bathing and  dressing techniques     Vision Vision Assessment?: No apparent visual deficits              Pertinent Vitals/Pain Pain Assessment: Faces Faces Pain Scale: Hurts a little bit Pain Location: right knee Pain Descriptors / Indicators: Sore;Aching Pain Intervention(s): Limited activity within patient's tolerance;Monitored during session;Premedicated before session;Repositioned     Hand Dominance Right   Extremity/Trunk Assessment Upper Extremity Assessment Upper Extremity Assessment: Overall WFL for tasks assessed   Lower Extremity Assessment Lower Extremity Assessment:  Defer to PT evaluation RLE Deficits / Details: hip flex 3/5, knee ext 3/5, knee flex 3/5, ankle WFL   Cervical / Trunk Assessment Cervical / Trunk Assessment: Normal   Communication Communication Communication: No difficulties   Cognition Arousal/Alertness: Awake/alert Behavior During Therapy: WFL for tasks assessed/performed Overall Cognitive Status: Within Functional Limits for tasks assessed                     General Comments   Pt pleasant, sister very supportive                Home Living Family/patient expects to be discharged to:: Private residence Living Arrangements: Other relatives (sister) Available Help at Discharge: Family;Available 24 hours/day Type of Home: House Home Access: Stairs to enter [employer] of Steps: 4 Entrance Stairs-Rails: Can reach both;Right;Left Home Layout: One level     Bathroom Shower/Tub: Tub/shower unit;Walk-in shower   Bathroom Toilet: Standard     Home Equipment: None   Additional Comments: pt's sister lives with him and will help as needed      Prior Functioning/Environment Level of Independence: Independent                 OT Problem List: Pain;Decreased knowledge of use of DME or AE;Decreased activity tolerance   OT Treatment/Interventions: Self-care/ADL training;DME and/or AE instruction;Therapeutic activities;Patient/family education    OT Goals(Current goals can be found in the care plan section) Acute Rehab OT Goals Patient Stated Goal: return home OT Goal Formulation: With patient/family Time For Goal Achievement: 08/09/16 Potential to Achieve Goals: Good ADL Goals Pt Will Perform Grooming: with min guard assist;standing;with caregiver independent in assisting Pt Will Perform Lower Body Bathing: with mod assist;with min assist;with caregiver independent in assisting Pt Will Perform Lower Body Dressing: with mod assist;with min assist;with caregiver independent in assisting Pt Will  Transfer to Toilet: with min guard assist;with supervision;ambulating Pt Will Perform Toileting - Clothing Manipulation and hygiene: with min assist;with min guard assist;sit to/from stand;with caregiver independent in assisting Pt Will Perform Tub/Shower Transfer: with min guard assist;with supervision;shower seat;3 in 1;rolling walker;ambulating  OT Frequency: Min 2X/week   Barriers to D/C:    no barriers                     End of Session CPM Right Knee CPM Right Knee: Off  Activity Tolerance: Other (comment) (attepmted x 2 to see pt, sleepy first attempt, eating lunch second attempt) Patient left:     Time: 5400-8676 OT Time Calculation (min): 25 min Charges:  OT General Charges $OT Visit: 1 Procedure OT Evaluation $OT Eval Moderate Complexity: 1 Procedure OT Treatments $Therapeutic Activity: 8-22 mins G-Codes:    Britt Bottom 08/02/2016, 2:31 PM

## 2016-08-02 NOTE — Evaluation (Signed)
Physical Therapy Evaluation Patient Details Name: Casey Brennan MRN: [id number] DOB: 12/27/1960 Today's Date: 08/02/2016   History of Present Illness  Casey Brennan is a 56 y.o. male with history of HIV, COPD, GERD, and hypertension who presents for right TKA after failure of conservative treatment to manage right knee OA.  Clinical Impression  Patient is s/p above surgery resulting in functional limitations due to the deficits listed below (see PT Problem List). Pt with some nausea this morning but was able to tolerate getting out of bed and ambulated 60' with min A and RW.   Patient will benefit from skilled PT to increase their independence and safety with mobility to allow discharge to the venue listed below.       Follow Up Recommendations Home health PT    Equipment Recommendations  Rolling walker with 5" wheels    Recommendations for Other Services       Precautions / Restrictions Precautions Precautions: Knee Precaution Booklet Issued: No Precaution Comments: reviewed proper positioning and use of CPM Restrictions Weight Bearing Restrictions: Yes RLE Weight Bearing: Weight bearing as tolerated      Mobility  Bed Mobility Overal bed mobility: Needs Assistance Bed Mobility: Sidelying to Sit;Rolling Rolling: Min guard Sidelying to sit: Min guard       General bed mobility comments: vc's for sequencing, pt able to use left LE to roll and eleveta etrunk with RLE off bed without physical assistance  Transfers Overall transfer level: Needs assistance Equipment used: Rolling walker (2 wheeled) Transfers: Sit to/from Stand Sit to Stand: Min guard         General transfer comment: vc's for hand placement  Ambulation/Gait Ambulation/Gait assistance: Min assist Ambulation Distance (Feet): 60 Feet Assistive device: Rolling walker (2 wheeled) Gait Pattern/deviations: Step-through pattern;Decreased step length - right;Decreased stance time - right;Decreased  weight shift to right Gait velocity: decreased Gait velocity interpretation: Below normal speed for age/gender General Gait Details: vc's for sequencing and getting right heel to floor  Stairs            Wheelchair Mobility    Modified Rankin (Stroke Patients Only)       Balance Overall balance assessment: No apparent balance deficits (not formally assessed)                                           Pertinent Vitals/Pain Pain Assessment: Faces Faces Pain Scale: Hurts even more Pain Location: right knee Pain Descriptors / Indicators: Aching;Operative site guarding Pain Intervention(s): Limited activity within patient's tolerance;Monitored during session;Premedicated before session;Repositioned    Home Living Family/patient expects to be discharged to:: Private residence Living Arrangements: Other relatives Available Help at Discharge: Family;Available 24 hours/day Type of Home: House Home Access: Stairs to enter Entrance Stairs-Rails: Can reach both;Right;Left Entrance Stairs-Number of Steps: 4 Home Layout: One level Home Equipment: None Additional Comments: pt's sister lives with him and will help as needed    Prior Function Level of Independence: Independent               Hand Dominance        Extremity/Trunk Assessment   Upper Extremity Assessment Upper Extremity Assessment: Overall WFL for tasks assessed    Lower Extremity Assessment Lower Extremity Assessment: RLE deficits/detail RLE Deficits / Details: hip flex 3/5, knee ext 3/5, knee flex 3/5, ankle WFL    Cervical / Trunk Assessment  Cervical / Trunk Assessment: Normal  Communication   Communication: No difficulties  Cognition Arousal/Alertness: Awake/alert Behavior During Therapy: WFL for tasks assessed/performed Overall Cognitive Status: Within Functional Limits for tasks assessed                      General Comments General comments (skin integrity,  edema, etc.): O2 sats 97% on RA. Pt left in zero knee foam in chair, very uncomfortable but with goal to stay in 15-20 mins and then replace with pillow under foot.     Exercises Total Joint Exercises Ankle Circles/Pumps: AROM;Both;20 reps;Seated Quad Sets: AROM;Both;10 reps;Seated Goniometric ROM: 10-85   Assessment/Plan    PT Assessment Patient needs continued PT services  PT Problem List Decreased strength;Decreased range of motion;Decreased activity tolerance;Decreased mobility;Decreased knowledge of use of DME;Decreased knowledge of precautions;Pain          PT Treatment Interventions DME instruction;Gait training;Stair training;Functional mobility training;Therapeutic activities;Therapeutic exercise;Patient/family education    PT Goals (Current goals can be found in the Care Plan section)  Acute Rehab PT Goals Patient Stated Goal: return home PT Goal Formulation: With patient/family Time For Goal Achievement: 08/09/16 Potential to Achieve Goals: Good    Frequency 7X/week   Barriers to discharge        Co-evaluation               End of Session Equipment Utilized During Treatment: Gait belt Activity Tolerance: Patient tolerated treatment well Patient left: in chair;with call bell/phone within reach;with family/visitor present;with chair alarm set Nurse Communication: Mobility status         Time: [id number] PT Time Calculation (min) (ACUTE ONLY): 23 min   Charges:   PT Evaluation $PT Eval Low Complexity: 1 Procedure PT Treatments $Gait Training: 8-22 mins   PT G Codes:      Leighton Roach, PT  Acute Rehab Services  [place] 08/02/2016, 11:18 AM

## 2016-08-03 MED ORDER — RITONAVIR 100 MG PO TABS
100.0000 mg | ORAL_TABLET | Freq: Every day | ORAL | Status: DC
  Filled 2016-08-03: qty 1

## 2016-08-03 MED ORDER — HYDROCODONE-CHLORPHENIRAMINE 5-4 MG/5ML PO SOLN: 5.0000 mL | Freq: Two times a day (BID) | ORAL | 0 refills | Status: DC | PRN

## 2016-08-03 NOTE — Progress Notes (Signed)
Orthopedic Tech Progress Note Patient Details:  Casey Brennan 1960-10-12 [id number]  Patient ID: [name], male   DOB: 07/14/1960, 56 y.o.   MRN: [id number] Applied cpm 0-70  Karolee Stamps 08/03/2016, 5:35 AM

## 2016-08-03 NOTE — Addendum Note (Signed)
Addendum  created 08/03/16 1222 by Suzette Battiest, MD   Anesthesia Intra Blocks edited, Sign clinical note

## 2016-08-03 NOTE — Progress Notes (Signed)
Physical Therapy Treatment Patient Details Name: Casey Brennan MRN: [id number] DOB: 1960-10-25 Today's Date: 08/03/2016    History of Present Illness Casey Brennan is a 56 y.o. male with history of HIV, COPD, GERD, and hypertension who presents for right TKA after failure of conservative treatment to manage right knee OA.    PT Comments    Patient is progressing well toward mobility goals and tolerated increased gait distance, stair training, and therex this am. Sister present for session. Continue to progress as tolerated with anticipated d/c home with HHPT.   Follow Up Recommendations  Home health PT     Equipment Recommendations  Rolling walker with 5" wheels    Recommendations for Other Services       Precautions / Restrictions Precautions Precautions: Knee Precaution Booklet Issued: No Precaution Comments: reviewed precautions and positioning Restrictions Weight Bearing Restrictions: Yes RLE Weight Bearing: Weight bearing as tolerated    Mobility  Bed Mobility Overal bed mobility: Modified Independent Bed Mobility: Supine to Sit;Sit to Supine           General bed mobility comments: increased time/effort  Transfers Overall transfer level: Needs assistance Equipment used: Rolling walker (2 wheeled) Transfers: Sit to/from Stand Sit to Stand: Supervision         General transfer comment: supervision for safety  Ambulation/Gait Ambulation/Gait assistance: Supervision Ambulation Distance (Feet): 200 Feet Assistive device: Rolling walker (2 wheeled) Gait Pattern/deviations: Step-through pattern;Decreased weight shift to right Gait velocity: decreased   General Gait Details: pt with improved WB R LE and with good step through pattern; cues for sequencing initially    Stairs Stairs: Yes   Stair Management: One rail Left;Step to pattern;Forwards Number of Stairs: 10 General stair comments: cues for sequencing and technique  Wheelchair Mobility     Modified Rankin (Stroke Patients Only)       Balance Overall balance assessment: No apparent balance deficits (not formally assessed)                                  Cognition Arousal/Alertness: Awake/alert Behavior During Therapy: WFL for tasks assessed/performed Overall Cognitive Status: Within Functional Limits for tasks assessed                      Exercises Total Joint Exercises Ankle Circles/Pumps: AROM;Both;Supine;10 reps Quad Sets: AROM;Right;10 reps Short Arc Quad: AROM;Right;10 reps Heel Slides: AROM;Right;10 reps Hip ABduction/ADduction: AROM;Right;10 reps Straight Leg Raises: AROM;Right;10 reps    General Comments General comments (skin integrity, edema, etc.): sister present during session      Pertinent Vitals/Pain Pain Assessment: Faces Faces Pain Scale: Hurts little more Pain Location: right knee Pain Descriptors / Indicators: Sore;Tightness Pain Intervention(s): Limited activity within patient's tolerance;Monitored during session;Premedicated before session;Repositioned    Home Living                      Prior Function            PT Goals (current goals can now be found in the care plan section) Acute Rehab PT Goals Patient Stated Goal: return home PT Goal Formulation: With patient/family Time For Goal Achievement: 08/09/16 Potential to Achieve Goals: Good Progress towards PT goals: Progressing toward goals    Frequency    7X/week      PT Plan Current plan remains appropriate    Co-evaluation  End of Session Equipment Utilized During Treatment: Gait belt Activity Tolerance: Patient tolerated treatment well Patient left: with call bell/phone within reach;with family/visitor present;in bed;Other (comment) (R LE in zero degree foam)     Time: [id number] PT Time Calculation (min) (ACUTE ONLY): 36 min  Charges:  $Gait Training: 8-22 mins $Therapeutic Exercise: 8-22 mins                     G Codes:      [place] April, PTA Pager: [phone]   08/03/2016, 9:36 AM

## 2016-08-03 NOTE — Progress Notes (Signed)
Pt ready for d/c home today per MD. Pt met PT/OT goals, all equipment delivered to pt's room. Discharge instructions and prescriptions reviewed with pt and his sister, April; all questions answered. IV removed. Belongings will be sent with pt.   [place], Jerry Caras

## 2016-08-03 NOTE — Discharge Summary (Signed)
Discharge Summary  Patient ID: Casey Brennan MRN: [id number] DOB/AGE: 1960/10/06 56 y.o.  Admit date: 08/01/2016 Discharge date: 08/03/2016  Admission Diagnoses:  Osteoarthritis of right knee  Discharge Diagnoses:  Principal Problem:   Osteoarthritis of right knee Active Problems:   Human immunodeficiency virus (HIV) disease ([place])   HTN (hypertension)   Asthma   H/O prostate cancer   COPD GOLD III   GERD (gastroesophageal reflux disease)   Low back pain   Chronic renal insufficiency, stage 2 (mild)   Primary osteoarthritis of right knee   Past Medical History:  Diagnosis Date  . Arthritis    r knee   . Asthma    very rare  . Axillary lymphadenopathy   . Bronchitis   . Chronic anemia    normocytic  . Chronic folliculitis   . Elevated PSA   . Environmental and seasonal allergies   . GERD (gastroesophageal reflux disease)   . Gross hematuria   . H/O pericarditis    2010--  myopercarditis--  resolved  . HCAP (healthcare-associated pneumonia) 11/19/2014  . Headache(784.0)    HX SEVERE FRONTAL HA'S  . History of concussion    2012  &  2013  RESIDUAL HA'S --  RESOLVED  . History of gastric ulcer   . History of kidney stones   . History of MRSA infection 2010   infected boil  . HIV (human immunodeficiency virus infection) ([place]) 1988  . Lytic bone lesion of hip    WORK-UP DONE BY ONCOLOGIST DR HA --  NOT MALIGNANT  . Pneumonia    hx of  . Post concussion syndrome    resolved  . Prostate cancer ([place]) 04/25/13   gleason 3+3=6, volume 30 gm  . Sinusitis, chronic 05/14/2015  . Ulcer ([place])    hx of gastric  . Wears glasses     Surgeries: Procedure(s): TOTAL KNEE ARTHROPLASTY on 08/01/2016   Consultants (if any):   Discharged Condition: Improved  Progress  Subjective: Feeling well.  OOB.  Pain controlled with PO meds.  Tolerating diet.  Urinating.  No CP, SOB.  Objective: General: NAD.  Upright in bed Resp: No increased WOB Cardio: regular rate and  rhythm ABD soft Neurologically intact MSK Neurovascularly intact Sensation intact distally Feet warm Dorsiflexion/Plantar flexion intact Incision: dressing C/D/I  Plan: Discharge home with home health Weight Bearing: Weight Bearing as Tolerated (WBAT)  Dressings: prn.  VTE prophylaxis: Lovenox, ambulation, SCDs Dispo: Home today   Hospital Course: Casey Brennan is an 56 y.o. male who was admitted 08/01/2016 with a diagnosis of Osteoarthritis of right knee and went to the operating room on 08/01/2016 and underwent the above named procedures.    He was given perioperative antibiotics:  Anti-infectives    Start     Dose/Rate Route Frequency Ordered Stop   08/03/16 1700  ritonavir (NORVIR) tablet 100 mg     100 mg Oral Daily with supper 08/03/16 1257     08/01/16 2200  raltegravir (ISENTRESS) tablet 400 mg  Status:  Discontinued     400 mg Oral 2 times daily 08/01/16 1623 08/01/16 1640   08/01/16 2200  elvitegravir-cobicistat-emtricitabine-tenofovir (GENVOYA) 150-150-200-10 MG tablet 1 tablet  Status:  Discontinued     1 tablet Oral Daily at bedtime 08/01/16 1623 08/01/16 2103   08/01/16 2200  raltegravir (ISENTRESS) tablet 400 mg     400 mg Oral 2 times daily 08/01/16 2056     08/01/16 2200  ritonavir (NORVIR) tablet 100 mg  Status:  Discontinued     100 mg Oral Daily at bedtime 08/01/16 2056 08/03/16 1257   08/01/16 2200  emtricitabine-tenofovir AF (DESCOVY) 200-25 MG per tablet 1 tablet     1 tablet Oral Daily 08/01/16 2056     08/01/16 1730  ritonavir (NORVIR) tablet 100 mg  Status:  Discontinued     100 mg Oral Daily with breakfast 08/01/16 1623 08/01/16 1640   08/01/16 1730  emtricitabine-tenofovir AF (DESCOVY) 200-25 MG per tablet 1 tablet  Status:  Discontinued     1 tablet Oral Daily 08/01/16 1623 08/01/16 1639   08/01/16 1700  Darunavir Ethanolate (PREZISTA) tablet 800 mg     800 mg Oral Daily with supper 08/01/16 1623     08/01/16 1630  clindamycin (CLEOCIN) IVPB 600  mg     600 mg 100 mL/hr over 30 Minutes Intravenous Every 6 hours 08/01/16 1623 08/02/16 0006   08/01/16 1045  clindamycin (CLEOCIN) IVPB 900 mg  Status:  Discontinued     900 mg 100 mL/hr over 30 Minutes Intravenous Once 08/01/16 1030 08/01/16 1113   08/01/16 1045  clindamycin (CLEOCIN) IVPB 900 mg     900 mg 150 mL/hr over 30 Minutes Intravenous To Surgery 08/01/16 1033 08/01/16 1045   08/01/16 0811  vancomycin (VANCOCIN) 1,500 mg in sodium chloride 0.9 % 500 mL IVPB     1,500 mg 250 mL/hr over 120 Minutes Intravenous On call to O.R. 08/01/16 [id number] 08/01/16 1109    .  He was given sequential compression devices, early ambulation, and Lovenox for DVT prophylaxis.  He benefited maximally from the hospital stay and there were no complications.    Recent vital signs:  Vitals:   08/03/16 0800 08/03/16 0930  BP: (!) 150/94   Pulse: 90 91  Resp: 14 14  Temp: 98.4 F (36.9 C)     Recent laboratory studies:  Lab Results  Component Value Date   HGB 12.7 (L) 07/21/2016   HGB 15.2 04/20/2016   HGB 13.4 10/07/2015   Lab Results  Component Value Date   WBC 7.3 07/21/2016   PLT 180 07/21/2016   Lab Results  Component Value Date   INR 0.92 04/17/2013   Lab Results  Component Value Date   NA 137 07/21/2016   K 3.5 07/21/2016   CL 101 07/21/2016   CO2 27 07/21/2016   BUN 13 07/21/2016   CREATININE 1.32 (H) 07/21/2016   GLUCOSE 97 07/21/2016    Discharge Medications:   Allergies as of 08/03/2016      Reactions   Chocolate Shortness Of Breath, Other (See Comments)   Asthma attack, welts   Cocoa Shortness Of Breath, Other (See Comments)   Other reaction(s): Other (See Comments) Asthma attack, welts   Morphine And Related Shortness Of Breath, Itching, Other (See Comments)    chest pain   Penicillins Shortness Of Breath, Rash   Has patient had a PCN reaction causing immediate rash, facial/tongue/throat swelling, SOB or lightheadedness with hypotension: Yes Has patient had  a PCN reaction causing severe rash involving mucus membranes or skin necrosis: No Has patient had a PCN reaction that required hospitalization No Has patient had a PCN reaction occurring within the last 10 years: Yes If all of the above answers are "NO", then may proceed with Cephalosporin use.   Sulfonamide Derivatives Shortness Of Breath, Rash   Tomato Shortness Of Breath   Sulfa Antibiotics    Tramadol Itching, Nausea And Vomiting   Tylenol [acetaminophen] Rash  Medication List    STOP taking these medications   elvitegravir-cobicistat-emtricitabine-tenofovir 150-150-200-10 MG Tabs tablet Commonly known as:  GENVOYA     TAKE these medications   albuterol 108 (90 Base) MCG/ACT inhaler Commonly known as:  PROVENTIL HFA;VENTOLIN HFA Inhale 2 puffs into the lungs every 4 (four) hours as needed for wheezing or shortness of breath.   albuterol (2.5 MG/3ML) 0.083% nebulizer solution Commonly known as:  PROVENTIL Take 3 mLs (2.5 mg total) by nebulization every 6 (six) hours as needed for wheezing or shortness of breath.   amLODipine 5 MG tablet Commonly known as:  NORVASC Take 5 mg by mouth at bedtime.   aspirin EC 81 MG tablet Take 81 mg by mouth See admin instructions. Monday, Wednesday, & Saturday At bedtime   Beclomethasone Dipropionate 80 MCG/ACT Aers Place 2 sprays into the nose once. What changed:  how much to take  when to take this   Darunavir Ethanolate 800 MG tablet Commonly known as:  PREZISTA Take 1 tablet (800 mg total) by mouth daily. What changed:  when to take this   diclofenac sodium 1 % Gel Commonly known as:  VOLTAREN APPLY TO THE AFFECTED AREA TWICE DAILY AS NEEDED What changed:  how much to take  how to take this  when to take this  reasons to take this  additional instructions   enoxaparin 40 MG/0.4ML injection Commonly known as:  LOVENOX Inject 0.4 mLs (40 mg total) into the skin daily. For 30 days post op for DVT prophylaxis    furosemide 40 MG tablet Commonly known as:  LASIX Take 0.5 tablets (20 mg total) by mouth daily. What changed:  when to take this   gabapentin 300 MG capsule Commonly known as:  NEURONTIN Take 1 capsule (300 mg total) by mouth 4 (four) times daily as needed (nerve pain). What changed:  when to take this   hydrochlorothiazide 25 MG tablet Commonly known as:  HYDRODIURIL Take 1 tablet (25 mg total) by mouth daily. What changed:  when to take this   Hydrocodone-Chlorpheniramine 5-4 MG/5ML Soln Take 5 mLs by mouth every 12 (twelve) hours as needed. For cough   hydrOXYzine 25 MG tablet Commonly known as:  ATARAX/VISTARIL TAKE 1 TABLET(25 MG) BY MOUTH EVERY 6 HOURS AS NEEDED FOR ANXIETY What changed:  See the new instructions.   ipratropium 0.02 % nebulizer solution Commonly known as:  ATROVENT USE 2.5 ML(0.5 MG) VIA NEBULIZER THREE TIMES DAILY AS NEEDED FOR WHEEZING OR SHORTNESS OF BREATH   meloxicam 7.5 MG tablet Commonly known as:  MOBIC Take 1 tablet (7.5 mg total) by mouth daily.   mometasone-formoterol 200-5 MCG/ACT Aero Commonly known as:  DULERA Take 2 puffs first thing in am and then another 2 puffs about 12 hours later. What changed:  how much to take  how to take this  when to take this  additional instructions   montelukast 10 MG tablet Commonly known as:  SINGULAIR Take 1 tablet (10 mg total) by mouth at bedtime.   multivitamin with minerals Tabs tablet Take 1 tablet by mouth at bedtime.   mupirocin ointment 2 % Commonly known as:  BACTROBAN APPLY TO THE AFFECTED AREA TWICE DAILY FOR 2 WEEKS, PLACE COTTON BETWEEN TOES TO ALLOW BETTER AERATION AS NEEDED   omeprazole 20 MG capsule Commonly known as:  PRILOSEC Take 1 capsule (20 mg total) by mouth at bedtime.   ondansetron 4 MG tablet Commonly known as:  ZOFRAN TAKE 1 TABLET BY MOUTH  EVERY 8 HOURS AS NEEDED FOR NAUSEA AND VOMITING What changed:  how much to take  how to take this  when to take  this  reasons to take this  additional instructions   ondansetron 4 MG tablet Commonly known as:  ZOFRAN Take 1 tablet (4 mg total) by mouth every 8 (eight) hours as needed for nausea or vomiting. What changed:  You were already taking a medication with the same name, and this prescription was added. Make sure you understand how and when to take each.   oxyCODONE-acetaminophen 5-325 MG tablet Commonly known as:  ROXICET Take 1-2 tablets by mouth every 4 (four) hours as needed for severe pain.   SUMAtriptan 100 MG tablet Commonly known as:  IMITREX Take 1 tablet (100 mg total) by mouth every 2 (two) hours as needed for migraine. May repeat in 2 hours if headache persists or recurs.   SYSTANE 0.4-0.3 % Gel ophthalmic gel Generic drug:  Polyethyl Glycol-Propyl Glycol Place 1 application into both eyes daily.   tizanidine 6 MG capsule Commonly known as:  ZANAFLEX Take 1 capsule (6 mg total) by mouth 3 (three) times daily as needed for muscle spasms.   zolpidem 12.5 MG CR tablet Commonly known as:  AMBIEN CR Take 12.5 mg by mouth at bedtime.      Discussed appropriate home HIV medicine regimen that recently replaced Genvoya as prescribed by Dr. [name]. The patient will not continue Genvoya He will resume Prezista, Noravir, [place], [place].   Diagnostic Studies: Dg Knee Right Port  Result Date: 08/01/2016 CLINICAL DATA:  Status post right total knee joint prosthesis placement. EXAM: PORTABLE RIGHT KNEE - 1-2 VIEW COMPARISON:  MRI of the right knee of May 01, 2014 FINDINGS: The patient has undergone right total knee joint prosthesis placement. Radiographic positioning of the prosthetic components is good. The interface with the native bone appears normal. There is air in fluid in the anterior aspect of the knee joint space. IMPRESSION: No immediate postprocedure complication following right total knee joint prosthesis placement. Electronically Signed   By: David  Martinique M.D.    On: 08/01/2016 13:35    Disposition: 01-Home or Self Care  Discharge Instructions    Discharge patient    Complete by:  As directed    Discharge disposition:  01-Home or Self Care   Discharge patient date:  08/03/2016      Follow-up Information    MURPHY, Ernesta Amble, MD Follow up.   Specialty:  Orthopedic Surgery Contact information: [address] [place] [address] [phone]        [facility] Follow up.   Specialty:  [place] Why:  Someone from [facility] will contact you to arrange start date and time for therapy. Contact information: [address] [place] [address] [phone]            Signed: Prudencio Burly III PA-C 08/03/2016, 1:45 PM

## 2016-08-07 ENCOUNTER — Emergency Department (HOSPITAL_COMMUNITY): Payer: 59

## 2016-08-07 ENCOUNTER — Emergency Department (HOSPITAL_COMMUNITY)
Admission: EM | Admit: 2016-08-07 | Discharge: 2016-08-07 | Disposition: A | Discharge status: Home / Self Care | Payer: 59 | Attending: Emergency Medicine | Admitting: Emergency Medicine

## 2016-08-07 ENCOUNTER — Emergency Department (HOSPITAL_BASED_OUTPATIENT_CLINIC_OR_DEPARTMENT_OTHER): Payer: 59

## 2016-08-07 ENCOUNTER — Encounter (HOSPITAL_COMMUNITY): Payer: Self-pay | Admitting: Emergency Medicine

## 2016-08-07 DIAGNOSIS — J45909 Unspecified asthma, uncomplicated: Secondary | ICD-10-CM | POA: Diagnosis not present

## 2016-08-07 DIAGNOSIS — Z87891 Personal history of nicotine dependence: Secondary | ICD-10-CM | POA: Diagnosis not present

## 2016-08-07 DIAGNOSIS — M7989 Other specified soft tissue disorders: Secondary | ICD-10-CM | POA: Diagnosis not present

## 2016-08-07 DIAGNOSIS — G8918 Other acute postprocedural pain: Secondary | ICD-10-CM | POA: Insufficient documentation

## 2016-08-07 DIAGNOSIS — R030 Elevated blood-pressure reading, without diagnosis of hypertension: Secondary | ICD-10-CM | POA: Diagnosis not present

## 2016-08-07 DIAGNOSIS — M79604 Pain in right leg: Secondary | ICD-10-CM | POA: Insufficient documentation

## 2016-08-07 DIAGNOSIS — Z8546 Personal history of malignant neoplasm of prostate: Secondary | ICD-10-CM | POA: Insufficient documentation

## 2016-08-07 DIAGNOSIS — Z96651 Presence of right artificial knee joint: Secondary | ICD-10-CM | POA: Diagnosis not present

## 2016-08-07 DIAGNOSIS — K5900 Constipation, unspecified: Secondary | ICD-10-CM | POA: Diagnosis not present

## 2016-08-07 DIAGNOSIS — G43009 Migraine without aura, not intractable, without status migrainosus: Secondary | ICD-10-CM

## 2016-08-07 DIAGNOSIS — M79609 Pain in unspecified limb: Secondary | ICD-10-CM

## 2016-08-07 DIAGNOSIS — M25561 Pain in right knee: Secondary | ICD-10-CM | POA: Diagnosis not present

## 2016-08-07 DIAGNOSIS — M1711 Unilateral primary osteoarthritis, right knee: Secondary | ICD-10-CM | POA: Diagnosis not present

## 2016-08-07 LAB — CBC WITH DIFFERENTIAL/PLATELET
Basophils Absolute: 0 10*3/uL (ref 0.0–0.1)
Basophils Relative: 0 %
Eosinophils Absolute: 0.4 10*3/uL (ref 0.0–0.7)
Eosinophils Relative: 4 %
HCT: 36.2 % — ABNORMAL LOW (ref 39.0–52.0)
Hemoglobin: 12.4 g/dL — ABNORMAL LOW (ref 13.0–17.0)
Lymphocytes Relative: 24 %
Lymphs Abs: 2.4 10*3/uL (ref 0.7–4.0)
MCH: 32.5 pg (ref 26.0–34.0)
MCHC: 34.3 g/dL (ref 30.0–36.0)
MCV: 95 fL (ref 78.0–100.0)
Monocytes Absolute: 1.1 10*3/uL — ABNORMAL HIGH (ref 0.1–1.0)
Monocytes Relative: 11 %
Neutro Abs: 6.2 10*3/uL (ref 1.7–7.7)
Neutrophils Relative %: 61 %
Platelets: 248 10*3/uL (ref 150–400)
RBC: 3.81 MIL/uL — ABNORMAL LOW (ref 4.22–5.81)
RDW: 14.3 % (ref 11.5–15.5)
WBC: 10.1 10*3/uL (ref 4.0–10.5)

## 2016-08-07 LAB — BASIC METABOLIC PANEL
Anion gap: 11 (ref 5–15)
BUN: 16 mg/dL (ref 6–20)
CO2: 28 mmol/L (ref 22–32)
Calcium: 8.9 mg/dL (ref 8.9–10.3)
Chloride: 96 mmol/L — ABNORMAL LOW (ref 101–111)
Creatinine, Ser: 1.18 mg/dL (ref 0.61–1.24)
GFR calc Af Amer: >60 mL/min (ref >60)
GFR calc non Af Amer: >60 mL/min (ref >60)
Glucose, Bld: 111 mg/dL — ABNORMAL HIGH (ref 65–99)
Potassium: 3.6 mmol/L (ref 3.5–5.1)
Sodium: 135 mmol/L (ref 135–145)

## 2016-08-07 MED ORDER — SUMATRIPTAN SUCCINATE 100 MG PO TABS: 100.0000 mg | ORAL_TABLET | ORAL | 0 refills | Status: DC | PRN

## 2016-08-07 MED ORDER — ONDANSETRON HCL 4 MG/2ML IJ SOLN
4.0000 mg | Freq: Once | INTRAMUSCULAR | Status: AC
  Administered 2016-08-07: 4 mg via INTRAVENOUS
  Filled 2016-08-07: qty 2

## 2016-08-07 MED ORDER — OXYCODONE-ACETAMINOPHEN 5-325 MG PO TABS
2.0000 | ORAL_TABLET | Freq: Once | ORAL | Status: AC
  Administered 2016-08-07: 2 via ORAL
  Filled 2016-08-07: qty 2

## 2016-08-07 MED ORDER — HYDROMORPHONE HCL 2 MG/ML IJ SOLN
0.5000 mg | Freq: Once | INTRAMUSCULAR | Status: AC
  Administered 2016-08-07: 0.5 mg via INTRAVENOUS
  Filled 2016-08-07: qty 1

## 2016-08-07 MED ORDER — DICYCLOMINE HCL 10 MG PO CAPS
20.0000 mg | ORAL_CAPSULE | Freq: Once | ORAL | Status: AC
  Administered 2016-08-07: 20 mg via ORAL
  Filled 2016-08-07: qty 2

## 2016-08-07 NOTE — ED Notes (Signed)
Pt given meds IV, with flush after iv slight swelling noted above catheter insertion site. IV removed.

## 2016-08-07 NOTE — ED Triage Notes (Signed)
Pt presents from home with [facility] for increased pain, swelling, bruising, and numbness that woke him up at 0130; pt states he had a R knee replacement on 1/30 with Dr. Percell Miller; bruising noted to R knee and thigh with warmth and swelling; pt reports lateral aspect of R great toe numb; strong pulses and motor function distally

## 2016-08-07 NOTE — ED Provider Notes (Signed)
  Physical Exam  BP 141/94   Pulse 93   Temp 98.2 F (36.8 C) (Oral)   Resp (!) 9   SpO2 95%   Physical Exam  ED Course  Procedures  MDM Discussed with patient. Negative Doppler. Will discharge home. Requested refill of his Imitrex. Will give a short refill. States that his primary care doctor is [place] of the [place].       Davonna Belling, MD 08/07/16 0900

## 2016-08-07 NOTE — ED Provider Notes (Addendum)
By signing my name below, I, Georgette Shell, attest that this documentation has been prepared under the direction and in the presence of [place], DO. Electronically Signed: Georgette Shell, ED Scribe. 08/07/16. 2:58 AM.  TIME SEEN: 2:53 AM  CHIEF COMPLAINT: No chief complaint on file.  HPI:  HPI Comments: MUSTAF FOUTCH is a 56 y.o. male with h/o asthma, prostate cancer, who presents to the Emergency Department by EMS complaining of increased pain, swelling, ecchymosis, and numbness to his right leg worsening ~1:30 am tonight. Pt states he had a right knee replacement on 08/01/16 with Dr. Percell Miller. Sister at bedside notes that pt has been taking his prescribed Oxycodone and has been doing his physical therapy exercises as directed. He is using his CPM. Sr. reports he was only taking one oxycodone every 4 hours as needed and was told tonight by the on-call nurse that he can increase this to 2 oxycodone at a time. He is on heparin injections to prevent DVT. Sister reports they were concerned because his calf felt tighter tonight. His follow-up appointment with Dr. Percell Miller is on 08/22/16. Pt denies fever, chills, or any other associated symptoms.  ROS: See HPI Constitutional: no fever  Eyes: no drainage  ENT: no runny nose   Cardiovascular:  no chest pain  Resp: no SOB  GI: no vomiting GU: no dysuria Integumentary: no rash  Allergy: no hives  Musculoskeletal: leg swelling  Neurological: no slurred speech ROS otherwise negative  PAST MEDICAL HISTORY/PAST SURGICAL HISTORY:  Past Medical History:  Diagnosis Date  . Arthritis    r knee   . Asthma    very rare  . Axillary lymphadenopathy   . Bronchitis   . Chronic anemia    normocytic  . Chronic folliculitis   . Elevated PSA   . Environmental and seasonal allergies   . GERD (gastroesophageal reflux disease)   . Gross hematuria   . H/O pericarditis    2010--  myopercarditis--  resolved  . HCAP (healthcare-associated pneumonia) 11/19/2014  .  Headache(784.0)    HX SEVERE FRONTAL HA'S  . History of concussion    2012  &  2013  RESIDUAL HA'S --  RESOLVED  . History of gastric ulcer   . History of kidney stones   . History of MRSA infection 2010   infected boil  . HIV (human immunodeficiency virus infection) ([place]) 1988  . Lytic bone lesion of hip    WORK-UP DONE BY ONCOLOGIST DR HA --  NOT MALIGNANT  . Pneumonia    hx of  . Post concussion syndrome    resolved  . Prostate cancer ([place]) 04/25/13   gleason 3+3=6, volume 30 gm  . Sinusitis, chronic 05/14/2015  . Ulcer ([place])    hx of gastric  . Wears glasses     MEDICATIONS:  Prior to Admission medications   Medication Sig Start Date End Date Taking? Authorizing Provider  albuterol (PROVENTIL HFA;VENTOLIN HFA) 108 (90 Base) MCG/ACT inhaler Inhale 2 puffs into the lungs every 4 (four) hours as needed for wheezing or shortness of breath. 05/22/16   Campbell Riches, MD  albuterol (PROVENTIL) (2.5 MG/3ML) 0.083% nebulizer solution Take 3 mLs (2.5 mg total) by nebulization every 6 (six) hours as needed for wheezing or shortness of breath. 05/22/16   Campbell Riches, MD  amLODipine (NORVASC) 5 MG tablet Take 5 mg by mouth at bedtime.  04/20/16   Historical Provider, MD  aspirin EC 81 MG tablet Take 81 mg  by mouth See admin instructions. Monday, Wednesday, & Saturday At bedtime    Historical Provider, MD  Beclomethasone Dipropionate 80 MCG/ACT AERS Place 2 sprays into the nose once. Patient taking differently: Place 1 spray into the nose 2 (two) times daily.  05/22/16 07/18/17  Campbell Riches, MD  Darunavir Ethanolate (PREZISTA) 800 MG tablet Take 1 tablet (800 mg total) by mouth daily. Patient taking differently: Take 800 mg by mouth at bedtime.  04/24/16   Campbell Riches, MD  diclofenac sodium (VOLTAREN) 1 % GEL APPLY TO THE AFFECTED AREA TWICE DAILY AS NEEDED Patient taking differently: Apply 1 application topically 2 (two) times daily as needed (pain).  02/05/15   Campbell Riches, MD  enoxaparin (LOVENOX) 40 MG/0.4ML injection Inject 0.4 mLs (40 mg total) into the skin daily. For 30 days post op for DVT prophylaxis 08/01/16 08/31/16  Prudencio Burly III, PA-C  furosemide (LASIX) 40 MG tablet Take 0.5 tablets (20 mg total) by mouth daily. Patient taking differently: Take 20 mg by mouth at bedtime.  02/22/16   Carly Montey Hora, MD  gabapentin (NEURONTIN) 300 MG capsule Take 1 capsule (300 mg total) by mouth 4 (four) times daily as needed (nerve pain). Patient taking differently: Take 300 mg by mouth 3 (three) times daily as needed (nerve pain).  09/07/15   Dennie Bible, NP  hydrochlorothiazide (HYDRODIURIL) 25 MG tablet Take 1 tablet (25 mg total) by mouth daily. Patient taking differently: Take 25 mg by mouth at bedtime.  10/14/15   Konrad Felix, PA  Hydrocodone-Chlorpheniramine 5-4 MG/5ML SOLN Take 5 mLs by mouth every 12 (twelve) hours as needed. For cough 08/03/16   Charna Elizabeth Martensen III, PA-C  hydrOXYzine (ATARAX/VISTARIL) 25 MG tablet TAKE 1 TABLET(25 MG) BY MOUTH EVERY 6 HOURS AS NEEDED FOR ANXIETY Patient taking differently: TAKE 1 TABLET(25 MG) BY MOUTH EVERY 6 HOURS AS NEEDED FOR ITCHING 12/20/15   Campbell Riches, MD  ipratropium (ATROVENT) 0.02 % nebulizer solution USE 2.5 ML(0.5 MG) VIA NEBULIZER THREE TIMES DAILY AS NEEDED FOR WHEEZING OR SHORTNESS OF BREATH 01/11/16   Juliet Rude, MD  meloxicam (MOBIC) 7.5 MG tablet Take 1 tablet (7.5 mg total) by mouth daily. Patient not taking: Reported on 07/18/2016 01/11/16 01/10/17  Holley Raring, MD  mometasone-formoterol [employer]) 200-5 MCG/ACT AERO Take 2 puffs first thing in am and then another 2 puffs about 12 hours later. Patient taking differently: Inhale 1 puff into the lungs every 12 (twelve) hours.  07/22/14   Tanda Rockers, MD  montelukast (SINGULAIR) 10 MG tablet Take 1 tablet (10 mg total) by mouth at bedtime. 04/24/16   Campbell Riches, MD  Multiple Vitamin (MULTIVITAMIN WITH MINERALS) TABS  tablet Take 1 tablet by mouth at bedtime.    Historical Provider, MD  mupirocin ointment (BACTROBAN) 2 % APPLY TO THE AFFECTED AREA TWICE DAILY FOR 2 WEEKS, PLACE COTTON BETWEEN TOES TO ALLOW BETTER AERATION AS NEEDED 11/15/15   Campbell Riches, MD  omeprazole (PRILOSEC) 20 MG capsule Take 1 capsule (20 mg total) by mouth at bedtime. 04/24/16   Campbell Riches, MD  ondansetron (ZOFRAN) 4 MG tablet TAKE 1 TABLET BY MOUTH EVERY 8 HOURS AS NEEDED FOR NAUSEA AND VOMITING Patient taking differently: Take 4 mg by mouth every 4 (four) hours as needed for nausea or vomiting.  04/24/16   Campbell Riches, MD  ondansetron (ZOFRAN) 4 MG tablet Take 1 tablet (4 mg total) by mouth every 8 (  eight) hours as needed for nausea or vomiting. 08/01/16   Prudencio Burly III, PA-C  oxyCODONE-acetaminophen (ROXICET) 5-325 MG tablet Take 1-2 tablets by mouth every 4 (four) hours as needed for severe pain. 08/01/16   Charna Elizabeth Martensen III, PA-C  Polyethyl Glycol-Propyl Glycol (SYSTANE) 0.4-0.3 % GEL ophthalmic gel Place 1 application into both eyes daily.    Historical Provider, MD  SUMAtriptan (IMITREX) 100 MG tablet Take 1 tablet (100 mg total) by mouth every 2 (two) hours as needed for migraine. May repeat in 2 hours if headache persists or recurs. 05/22/16   Campbell Riches, MD  tizanidine (ZANAFLEX) 6 MG capsule Take 1 capsule (6 mg total) by mouth 3 (three) times daily as needed for muscle spasms. 09/07/15   Dennie Bible, NP  zolpidem (AMBIEN CR) 12.5 MG CR tablet Take 12.5 mg by mouth at bedtime.    Historical Provider, MD    ALLERGIES:  Allergies  Allergen Reactions  . Chocolate Shortness Of Breath and Other (See Comments)    Asthma attack, welts  . Cocoa Shortness Of Breath and Other (See Comments)    Other reaction(s): Other (See Comments) Asthma attack, welts  . Morphine And Related Shortness Of Breath, Itching and Other (See Comments)     chest pain  . Penicillins Shortness Of Breath  and Rash    Has patient had a PCN reaction causing immediate rash, facial/tongue/throat swelling, SOB or lightheadedness with hypotension: Yes Has patient had a PCN reaction causing severe rash involving mucus membranes or skin necrosis: No Has patient had a PCN reaction that required hospitalization No Has patient had a PCN reaction occurring within the last 10 years: Yes If all of the above answers are "NO", then may proceed with Cephalosporin use.  . Sulfonamide Derivatives Shortness Of Breath and Rash  . Tomato Shortness Of Breath  . Sulfa Antibiotics   . Tramadol Itching and Nausea And Vomiting  . Tylenol [Acetaminophen] Rash    SOCIAL HISTORY:  Social History  Substance Use Topics  . Smoking status: Former Smoker    Packs/day: 1.50    Years: 23.00    Types: Cigars, Cigarettes    Quit date: 05/22/2010  . Smokeless tobacco: Never Used  . Alcohol use No    FAMILY HISTORY: Family History  Problem Relation Age of Onset  . Stroke Father   . Diabetes Father   . Cancer Father     brain cancer  . Asthma Father   . Hypertension Sister   . Cancer Maternal Uncle     prostate cancer  . Asthma Sister     EXAM: BP (!) 152/103   Pulse 86   Temp 98.2 F (36.8 C) (Oral)   Resp 11   SpO2 94%  CONSTITUTIONAL: Alert and oriented and responds appropriately to questions. Well-appearing; well-nourished, afebrile, appears drowsy but is arousable HEAD: Normocephalic EYES: Conjunctivae clear, PERRL, EOMI ENT: normal nose; no rhinorrhea; moist mucous membranes NECK: Supple, no meningismus, no nuchal rigidity, no LAD  CARD: RRR; S1 and S2 appreciated; no murmurs, no clicks, no rubs, no gallops RESP: Normal chest excursion without splinting or tachypnea; breath sounds clear and equal bilaterally; no wheezes, no rhonchi, no rales, no hypoxia or respiratory distress, speaking full sentences ABD/GI: Normal bowel sounds; non-distended; soft, non-tender, no rebound, no guarding, no peritoneal  signs, no hepatosplenomegaly BACK:  The back appears normal and is non-tender to palpation, there is no CVA tenderness EXT: Patient has a large vertical surgical scar  over the right anterior knee without bleeding or purulent drainage. Area around the surgical scar is slightly warm to touch but not erythematous or indurated. There is associated ecchymosis around the scar. There is associated swelling to this lower extremity but compartments are soft. He has very strong palpable DP pulses bilaterally. Reports normal sensation throughout the extremities. There is a joint effusion to the right knee. He is able to flex his knee to 45 degrees and extend his knee fully but not able to flex it fully. SKIN: Normal color for age and race; warm; no rash NEURO: Moves all extremities equally, sensation to light touch intact diffusely, cranial nerves II through XII intact, normal speech PSYCH: The patient's mood and manner are appropriate. Grooming and personal hygiene are appropriate.  MEDICAL DECISION MAKING: Patient here with postoperative pain. I do not see anything on his exam that looks like septic arthritis or cellulitis at this time. I feel that we have likely just fallen behind on pain control and his leg appears swollen. We will elevated and apply ice. He has a risk for DVT given recent surgery, hospitalization and immobilization. He is on heparin but we will obtain a venous Doppler in the morning. We'll give Dilaudid for pain control. We'll check basic blood work but again very low suspicion for infection. Will obtain an x-ray to ensure no shifting of hardware or any bony abnormality.  I feel like the swelling, ecchymosis and warmth to this area is typical postoperatively. He has not had any fevers or chills at home. He is afebrile here.  ED PROGRESS: 3:55 AM  Patient's labs reassuring. No leukocytosis. X-ray shows intact well-seated femoral, tibial and patellar components without fracture deformity, bony  lesions. He does have some subcutaneous gas without foreign bodies with resolution of intra-articular gas. This is likely postoperative. Patient's blood pressure is improving. Patient's sister is very concerned about the blood pressure. He has no severe headache, vision changes, chest pain or shortness of breath, numbness, tingling or focal weakness. Attempted to reassure her. It appears that he has had blood pressures this elevated many times in the past. He is on Norvasc and have recommended follow-up with his primary care physician. I also recommended they follow-up with Dr. Percell Miller in the morning. We will elevate his leg, apply ice. We have changed his dressing. He appears very comfortable, sleeping and is not in any distress. He will have a venous Doppler in the morning. Anticipate discharge home after the study is complete.  I reviewed all nursing notes, vitals, pertinent old records, EKGs, labs, imaging (as available).   I personally performed the services described in this documentation, which was scribed in my presence. The recorded information has been reviewed and is accurate.     [place], DO 08/07/16 [id number]    6:20 AM  Pt's leg pain is controlled. Reports he is just having abdominal cramping. Has been trying Dulcolax, MiraLAX, magnesium citrate at home to have a bowel movement without much success. He is still passing gas. Abdominal exam is soft without distention, tympany. Doubt bowel obstruction. Will give Bentyl for his crampy pain. Have recommended he continue an aggressive bowel regimen every day. We'll sign out oncoming provider to reassess patient prior to discharge and follow up on his venous Doppler.   [place], DO 08/07/16 [place], DO 08/07/16 [id number]

## 2016-08-07 NOTE — Progress Notes (Signed)
VASCULAR LAB PRELIMINARY  PRELIMINARY  PRELIMINARY  PRELIMINARY  Right lower extremity venous duplex completed.    Preliminary report:  There is no DVT or SVT noted in the right lower extremity.  Called report to Dr. Alvino Chapel.   Aryssa Rosamond, RVT 08/07/2016, 9:08 AM

## 2016-08-07 NOTE — Discharge Instructions (Addendum)
Your labs, x-ray were normal today. I recommend close follow up with your primary care physician for your elevated blood pressure. Please continue Norvasc as prescribed. Please call Dr. Percell Miller for close follow-up for your increased knee pain. If you ever have a temperature of 100.4 higher, drainage of pus from your wound, this area is red or the warmth of this area increases, you have numbness in your foot or your foot is cold and blue, please return to the emergency department. You may take 2 oxycodone tablets every 4 hours as needed for pain. Please continue your bowel regimen.  I recommend MiraLAX twice a day, Colace 100 mg twice a day, Benefiber or Metamucil once a day. If this does not help you have a bowel movement he may use Dulcolax, Fleet enemas. Please keep your leg elevated when at rest and apply ice as this will help with swelling and pain.

## 2016-08-07 NOTE — ED Notes (Signed)
Patient transported to X-ray 

## 2016-08-07 NOTE — ED Notes (Signed)
Patient at u/s 

## 2016-08-08 NOTE — Op Note (Signed)
DATE OF SURGERY:  08/01/2016 TIME: 12:23 PM  PATIENT NAME:  Casey Brennan   AGE: 56 y.o.    PRE-OPERATIVE DIAGNOSIS:  OA RIGHT KNEE  POST-OPERATIVE DIAGNOSIS:  Same  PROCEDURE:  Procedure(s): TOTAL KNEE ARTHROPLASTY   SURGEON:  Stephanee Barcomb D, MD   ASSISTANT:  Roxan Hockey, PA-C, he was present and scrubbed throughout the case, critical for completion in a timely fashion, and for retraction, instrumentation, and closure.    OPERATIVE IMPLANTS: [employer] Triathlon Posterior Stabilized press fit.   PREOPERATIVE INDICATIONS:  Casey Brennan is a 56 y.o. year old male with end stage bone on bone degenerative arthritis of the knee who failed conservative treatment, including injections, antiinflammatories, activity modification, and assistive devices, and had significant impairment of their activities of daily living, and elected for Total Knee Arthroplasty.   The risks, benefits, and alternatives were discussed at length including but not limited to the risks of infection, bleeding, nerve injury, stiffness, blood clots, the need for revision surgery, cardiopulmonary complications, among others, and they were willing to proceed.   OPERATIVE DESCRIPTION:  The patient was brought to the operative room and placed in a supine position.  General anesthesia was administered.  IV antibiotics were given.  The lower extremity was prepped and draped in the usual sterile fashion.  Time out was performed.  The leg was elevated and exsanguinated and the tourniquet was inflated.  Anterior approach was performed.  The patella was everted and osteophytes were removed.  The anterior horn of the medial and lateral meniscus was removed.   The distal femur was opened with the drill and the intramedullary distal femoral cutting jig was utilized, set at 5 degrees resecting 8 mm off the distal femur.  Care was taken to protect the collateral ligaments.  The distal femoral sizing jig was applied,  taking care to avoid notching.  Then the 4-in-1 cutting jig was applied and the anterior and posterior femur was cut, along with the chamfer cuts.  All posterior osteophytes were removed.  The flexion gap was then measured and was symmetric with the extension gap.  Then the extramedullary tibial cutting jig was utilized making the appropriate cut using the anterior tibial crest as a reference building in appropriate posterior slope.  Care was taken during the cut to protect the medial and collateral ligaments.  The proximal tibia was removed along with the posterior horns of the menisci.  The PCL was sacrificed.    The extensor gap was measured and was approximately 68mm.    I completed the distal femoral preparation using the appropriate jig to prepare the box.  The patella was then measured, and cut with the saw.    The proximal tibia sized and prepared accordingly with the reamer and the punch, and then all components were trialed with the 5mm poly insert.  The knee was found to have excellent balance and full motion.    The above named components were then press fit into place and all excess cement was removed. Poly tibial piece and patella were inserted.  I was very happy with his stability and ROM  I performed a periarticular injection with marcaine and toradol  The knee was easily taken through a range of motion and the patella tracked well and the knee irrigated copiously and the parapatellar and subcutaneous tissue closed with vicryl, and monocryl with steri strips for the skin.  The incision was dressed with sterile gauze and the tourniquet released and the patient was  awakened and returned to the PACU in stable and satisfactory condition.  There were no complications.  Total tourniquet time was 90 minutes.   POSTOPERATIVE PLAN: post op Abx, DVT px: SCD's, TED's, Early ambulation and chemical px

## 2016-08-10 DIAGNOSIS — Z96651 Presence of right artificial knee joint: Secondary | ICD-10-CM | POA: Diagnosis not present

## 2016-08-11 DIAGNOSIS — Z96651 Presence of right artificial knee joint: Secondary | ICD-10-CM | POA: Diagnosis not present

## 2016-08-14 ENCOUNTER — Other Ambulatory Visit: Payer: Self-pay | Admitting: Infectious Diseases

## 2016-08-14 ENCOUNTER — Other Ambulatory Visit: Payer: Self-pay | Admitting: Internal Medicine

## 2016-08-14 DIAGNOSIS — K219 Gastro-esophageal reflux disease without esophagitis: Secondary | ICD-10-CM

## 2016-08-16 DIAGNOSIS — Z96651 Presence of right artificial knee joint: Secondary | ICD-10-CM | POA: Diagnosis not present

## 2016-08-16 DIAGNOSIS — M545 Low back pain: Secondary | ICD-10-CM | POA: Diagnosis not present

## 2016-08-22 ENCOUNTER — Other Ambulatory Visit: Payer: Self-pay | Admitting: Internal Medicine

## 2016-08-22 ENCOUNTER — Other Ambulatory Visit: Payer: Self-pay | Admitting: Infectious Diseases

## 2016-08-22 DIAGNOSIS — M25661 Stiffness of right knee, not elsewhere classified: Secondary | ICD-10-CM | POA: Diagnosis not present

## 2016-08-22 DIAGNOSIS — M25561 Pain in right knee: Secondary | ICD-10-CM | POA: Diagnosis not present

## 2016-08-22 DIAGNOSIS — R262 Difficulty in walking, not elsewhere classified: Secondary | ICD-10-CM | POA: Diagnosis not present

## 2016-08-22 DIAGNOSIS — M1711 Unilateral primary osteoarthritis, right knee: Secondary | ICD-10-CM | POA: Diagnosis not present

## 2016-08-24 ENCOUNTER — Telehealth: Payer: Self-pay | Admitting: *Deleted

## 2016-08-24 ENCOUNTER — Encounter (HOSPITAL_COMMUNITY): Payer: Self-pay | Admitting: Emergency Medicine

## 2016-08-24 ENCOUNTER — Ambulatory Visit (HOSPITAL_COMMUNITY)
Admission: EM | Admit: 2016-08-24 | Discharge: 2016-08-24 | Disposition: A | Discharge status: Home / Self Care | Payer: 59 | Attending: Family Medicine | Admitting: Family Medicine

## 2016-08-24 DIAGNOSIS — K219 Gastro-esophageal reflux disease without esophagitis: Secondary | ICD-10-CM | POA: Insufficient documentation

## 2016-08-24 DIAGNOSIS — Z96651 Presence of right artificial knee joint: Secondary | ICD-10-CM | POA: Diagnosis not present

## 2016-08-24 DIAGNOSIS — R590 Localized enlarged lymph nodes: Secondary | ICD-10-CM | POA: Insufficient documentation

## 2016-08-24 DIAGNOSIS — L0291 Cutaneous abscess, unspecified: Secondary | ICD-10-CM

## 2016-08-24 DIAGNOSIS — Z87891 Personal history of nicotine dependence: Secondary | ICD-10-CM | POA: Diagnosis not present

## 2016-08-24 DIAGNOSIS — Z7982 Long term (current) use of aspirin: Secondary | ICD-10-CM | POA: Insufficient documentation

## 2016-08-24 DIAGNOSIS — L02214 Cutaneous abscess of groin: Secondary | ICD-10-CM | POA: Diagnosis not present

## 2016-08-24 DIAGNOSIS — Z8614 Personal history of Methicillin resistant Staphylococcus aureus infection: Secondary | ICD-10-CM | POA: Diagnosis not present

## 2016-08-24 DIAGNOSIS — Z8719 Personal history of other diseases of the digestive system: Secondary | ICD-10-CM | POA: Insufficient documentation

## 2016-08-24 DIAGNOSIS — Z87442 Personal history of urinary calculi: Secondary | ICD-10-CM | POA: Insufficient documentation

## 2016-08-24 DIAGNOSIS — Z8546 Personal history of malignant neoplasm of prostate: Secondary | ICD-10-CM | POA: Diagnosis not present

## 2016-08-24 DIAGNOSIS — B20 Human immunodeficiency virus [HIV] disease: Secondary | ICD-10-CM | POA: Insufficient documentation

## 2016-08-24 DIAGNOSIS — D649 Anemia, unspecified: Secondary | ICD-10-CM | POA: Diagnosis not present

## 2016-08-24 DIAGNOSIS — R310 Gross hematuria: Secondary | ICD-10-CM | POA: Diagnosis not present

## 2016-08-24 DIAGNOSIS — J45909 Unspecified asthma, uncomplicated: Secondary | ICD-10-CM | POA: Insufficient documentation

## 2016-08-24 MED ORDER — HYDROCODONE-ACETAMINOPHEN 5-325 MG PO TABS
1.0000 | ORAL_TABLET | Freq: Once | ORAL | Status: AC
  Administered 2016-08-24: 1 via ORAL

## 2016-08-24 MED ORDER — HYDROCODONE-ACETAMINOPHEN 5-325 MG PO TABS
ORAL_TABLET | ORAL | Status: AC
  Filled 2016-08-24: qty 1

## 2016-08-24 MED ORDER — CLINDAMYCIN HCL 300 MG PO CAPS: 300.0000 mg | ORAL_CAPSULE | Freq: Three times a day (TID) | ORAL | 0 refills | Status: DC

## 2016-08-24 NOTE — ED Provider Notes (Signed)
CSN: [id number]     Arrival date & time 08/24/16  1512 History   First MD Initiated Contact with Patient 08/24/16 1551     Chief Complaint  Patient presents with  . Abscess   (Consider location/radiation/quality/duration/timing/severity/associated sxs/prior Treatment) 56 year old male with history of HIV, MRSA, recent hip replacement, chronic anemia among others presents to the urgent care after seeing his orthopedic surgeon this morning. They sent him to the urgent care for evaluation of an abscess to the left inguina. The patient states that it was draining this morning and he was able to express some pus from the wound. On arrival it is approximately 3 cm x 1.5 cm. The patient states it is not tender or painful.      Past Medical History:  Diagnosis Date  . Arthritis    r knee   . Asthma    very rare  . Axillary lymphadenopathy   . Bronchitis   . Chronic anemia    normocytic  . Chronic folliculitis   . Elevated PSA   . Environmental and seasonal allergies   . GERD (gastroesophageal reflux disease)   . Gross hematuria   . H/O pericarditis    2010--  myopercarditis--  resolved  . HCAP (healthcare-associated pneumonia) 11/19/2014  . Headache(784.0)    HX SEVERE FRONTAL HA'S  . History of concussion    2012  &  2013  RESIDUAL HA'S --  RESOLVED  . History of gastric ulcer   . History of kidney stones   . History of MRSA infection 2010   infected boil  . HIV (human immunodeficiency virus infection) ([place]) 1988  . Lytic bone lesion of hip    WORK-UP DONE BY ONCOLOGIST DR HA --  NOT MALIGNANT  . Pneumonia    hx of  . Post concussion syndrome    resolved  . Prostate cancer ([place]) 04/25/13   gleason 3+3=6, volume 30 gm  . Sinusitis, chronic 05/14/2015  . Ulcer ([place])    hx of gastric  . Wears glasses    Past Surgical History:  Procedure Laterality Date  . CARDIAC CATHETERIZATION  03-23-2009  DR COOPER   NORMAL CORONARY ARTERIES  . CHOLECYSTECTOMY N/A 11/06/2014   Procedure: LAPAROSCOPIC CHOLECYSTECTOMY WITH INTRAOPERATIVE CHOLANGIOGRAM;  Surgeon: Judeth Horn, MD;  Location: [facility];  Service: General;  Laterality: N/A;  . COLONOSCOPY  12/26/2011   Procedure: COLONOSCOPY;  Surgeon: Lear Ng, MD;  Location: [facility];  Service: Endoscopy;  Laterality: N/A;  . COLONOSCOPY WITH PROPOFOL N/A 07/29/2014   Procedure: COLONOSCOPY WITH PROPOFOL;  Surgeon: Lear Ng, MD;  Location: [facility];  Service: Endoscopy;  Laterality: N/A;  . CYSTOSCOPY/RETROGRADE/URETEROSCOPY Bilateral 04/25/2013   Procedure: CYSTOSCOPY/ BILATERAL RETROGRADES; BLADDER BIOPSIES;  Surgeon: Alexis Frock, MD;  Location[facility];  Service: Urology;  Laterality: Bilateral;  . DENTAL EXAMINATION UNDER ANESTHESIA    . ESOPHAGOGASTRODUODENOSCOPY  12/26/2011   Procedure: ESOPHAGOGASTRODUODENOSCOPY (EGD);  Surgeon: Lear Ng, MD;  Location: [facility];  Service: Endoscopy;  Laterality: N/A;  . ESOPHAGOGASTRODUODENOSCOPY (EGD) WITH PROPOFOL N/A 07/29/2014   Procedure: ESOPHAGOGASTRODUODENOSCOPY (EGD) WITH PROPOFOL;  Surgeon: Lear Ng, MD;  Location: [facility];  Service: Endoscopy;  Laterality: N/A;  . EXCISION CHRONIC LEFT BREAST ABSCESS  09-21-2010  . EXCISIONAL BX LEFT BREAST MASS/  I  &  D LEFT BREAST ABSCESS  03-24-2009  . KNEE ARTHROSCOPY Right 1985  . left axilla biopsy  04/2013  . left hip biopsy  10/2012  . LYMPHADENECTOMY Bilateral  08/18/2013   Procedure: LYMPHADENECTOMY "PELVIC LYMPH NODE DISSECTION";  Surgeon: Alexis Frock, MD;  Location: [facility];  Service: Urology;  Laterality: Bilateral;  . PROSTATE BIOPSY N/A 04/25/2013   Procedure: BIOPSY TRANSRECTAL ULTRASONIC PROSTATE (TUBP);  Surgeon: Alexis Frock, MD;  Location: [facility];  Service: Urology;  Laterality: N/A;  . ROBOT ASSISTED LAPAROSCOPIC RADICAL PROSTATECTOMY N/A 08/18/2013   Procedure: ROBOTIC ASSISTED LAPAROSCOPIC RADICAL PROSTATECTOMY;   Surgeon: Alexis Frock, MD;  Location: [facility];  Service: Urology;  Laterality: N/A;  . TOTAL KNEE ARTHROPLASTY Right 08/01/2016  . TOTAL KNEE ARTHROPLASTY Right 08/01/2016   Procedure: TOTAL KNEE ARTHROPLASTY;  Surgeon: Renette Butters, MD;  Location: [facility];  Service: Orthopedics;  Laterality: Right;  . UPPER LEG SOFT TISSUE BIOPSY Left 2012   lthigh   Family History  Problem Relation Age of Onset  . Stroke Father   . Diabetes Father   . Cancer Father     brain cancer  . Asthma Father   . Hypertension Sister   . Cancer Maternal Uncle     prostate cancer  . Asthma Sister    Social History  Substance Use Topics  . Smoking status: Former Smoker    Packs/day: 1.50    Years: 23.00    Types: Cigars, Cigarettes    Quit date: 05/22/2010  . Smokeless tobacco: Never Used  . Alcohol use No    Review of Systems  Constitutional: Negative.   Respiratory: Negative.   Genitourinary: Negative.   Skin: Negative for rash.       As per history of present illness.  All other systems reviewed and are negative.   Allergies  Chocolate; Cocoa; Morphine and related; Penicillins; Sulfonamide derivatives; Tomato; Sulfa antibiotics; Tramadol; and Tylenol [acetaminophen]  Home Medications   Prior to Admission medications   Medication Sig Start Date End Date Taking? Authorizing Provider  albuterol (PROVENTIL HFA;VENTOLIN HFA) 108 (90 Base) MCG/ACT inhaler Inhale 2 puffs into the lungs every 4 (four) hours as needed for wheezing or shortness of breath. 05/22/16  Yes Campbell Riches, MD  albuterol (PROVENTIL) (2.5 MG/3ML) 0.083% nebulizer solution Take 3 mLs (2.5 mg total) by nebulization every 6 (six) hours as needed for wheezing or shortness of breath. 05/22/16  Yes Campbell Riches, MD  amLODipine (NORVASC) 5 MG tablet Take 5 mg by mouth at bedtime.  04/20/16  Yes Historical Provider, MD  aspirin EC 81 MG tablet Take 81 mg by mouth See admin instructions. Monday, Wednesday, & Saturday At  bedtime   Yes Historical Provider, MD  Beclomethasone Dipropionate 80 MCG/ACT AERS Place 2 sprays into the nose once. Patient taking differently: Place 1 spray into the nose 2 (two) times daily.  05/22/16 07/18/17 Yes Campbell Riches, MD  Darunavir Ethanolate (PREZISTA) 800 MG tablet Take 1 tablet (800 mg total) by mouth daily. Patient taking differently: Take 800 mg by mouth at bedtime.  04/24/16  Yes Campbell Riches, MD  diclofenac sodium (VOLTAREN) 1 % GEL APPLY TO THE AFFECTED AREA TWICE DAILY AS NEEDED Patient taking differently: Apply 1 application topically 2 (two) times daily as needed (pain).  02/05/15  Yes Campbell Riches, MD  enoxaparin (LOVENOX) 40 MG/0.4ML injection Inject 0.4 mLs (40 mg total) into the skin daily. For 30 days post op for DVT prophylaxis 08/01/16 08/31/16 Yes Charna Elizabeth Martensen III, PA-C  furosemide (LASIX) 40 MG tablet Take 0.5 tablets (20 mg total) by mouth daily. Patient taking differently: Take 20 mg by mouth at  bedtime.  02/22/16  Yes Carly Montey Hora, MD  gabapentin (NEURONTIN) 300 MG capsule Take 1 capsule (300 mg total) by mouth 4 (four) times daily as needed (nerve pain). Patient taking differently: Take 300 mg by mouth 3 (three) times daily as needed (nerve pain).  09/07/15  Yes Dennie Bible, NP  hydrochlorothiazide (HYDRODIURIL) 25 MG tablet Take 1 tablet (25 mg total) by mouth daily. Patient taking differently: Take 25 mg by mouth at bedtime.  10/14/15  Yes Konrad Felix, PA  hydrOXYzine (ATARAX/VISTARIL) 25 MG tablet TAKE 1 TABLET(25 MG) BY MOUTH EVERY 6 HOURS AS NEEDED FOR ANXIETY Patient taking differently: TAKE 1 TABLET(25 MG) BY MOUTH EVERY 6 HOURS AS NEEDED FOR ITCHING 12/20/15  Yes Campbell Riches, MD  ipratropium (ATROVENT) 0.02 % nebulizer solution USE 2.5 ML(0.5 MG) VIA NEBULIZER THREE TIMES DAILY AS NEEDED FOR WHEEZING OR SHORTNESS OF BREATH 01/11/16  Yes Carly Montey Hora, MD  mometasone-formoterol (DULERA) 200-5 MCG/ACT AERO Take 2 puffs  first thing in am and then another 2 puffs about 12 hours later. Patient taking differently: Inhale 1 puff into the lungs every 12 (twelve) hours.  07/22/14  Yes Tanda Rockers, MD  montelukast (SINGULAIR) 10 MG tablet Take 1 tablet (10 mg total) by mouth at bedtime. 04/24/16  Yes Campbell Riches, MD  Multiple Vitamin (MULTIVITAMIN WITH MINERALS) TABS tablet Take 1 tablet by mouth at bedtime.   Yes Historical Provider, MD  mupirocin ointment (BACTROBAN) 2 % APPLY TO THE AFFECTED AREA TWICE DAILY FOR 2 WEEKS, PLACE COTTON BETWEEN TOES TO ALLOW BETTER AERATION AS NEEDED 08/22/16  Yes Campbell Riches, MD  omeprazole (PRILOSEC) 20 MG capsule TAKE 1 CAPSULE(20 MG) BY MOUTH AT BEDTIME 08/15/16  Yes Campbell Riches, MD  ondansetron (ZOFRAN) 4 MG tablet TAKE 1 TABLET BY MOUTH EVERY 8 HOURS AS NEEDED FOR NAUSEA AND VOMITING Patient taking differently: Take 4 mg by mouth every 4 (four) hours as needed for nausea or vomiting.  04/24/16  Yes Campbell Riches, MD  ondansetron (ZOFRAN) 4 MG tablet Take 1 tablet (4 mg total) by mouth every 8 (eight) hours as needed for nausea or vomiting. 08/01/16  Yes Charna Elizabeth Martensen III, PA-C  oxyCODONE-acetaminophen (ROXICET) 5-325 MG tablet Take 1-2 tablets by mouth every 4 (four) hours as needed for severe pain. 08/01/16  Yes Charna Elizabeth Martensen III, PA-C  SUMAtriptan (IMITREX) 100 MG tablet Take 1 tablet (100 mg total) by mouth every 2 (two) hours as needed for migraine. May repeat in 2 hours if headache persists or recurs. 08/07/16  Yes Davonna Belling, MD  tizanidine (ZANAFLEX) 6 MG capsule Take 1 capsule (6 mg total) by mouth 3 (three) times daily as needed for muscle spasms. 09/07/15  Yes Dennie Bible, NP  zolpidem (AMBIEN CR) 12.5 MG CR tablet Take 12.5 mg by mouth at bedtime.   Yes Historical Provider, MD  Polyethyl Glycol-Propyl Glycol (SYSTANE) 0.4-0.3 % GEL ophthalmic gel Place 1 application into both eyes daily.    Historical Provider, MD   Meds  Ordered and Administered this Visit   Medications  HYDROcodone-acetaminophen (NORCO/VICODIN) 5-325 MG per tablet 1 tablet (1 tablet Oral Given 08/24/16 1623)    BP (!) 143/101 (BP Location: Right Arm)   Pulse 92   Temp 98.9 F (37.2 C) (Oral)   Resp 16   SpO2 96%  No data found.   Physical Exam  Constitutional: He is oriented to person, place, and time. He appears well-developed and  well-nourished. No distress.  Eyes: EOM are normal.  Neck: Neck supple.  Cardiovascular: Normal rate.   Pulmonary/Chest: Effort normal.  Musculoskeletal:  Patient had an auditory was short steps using a wheeled walker. He is able to get onto the table with assistance.  Neurological: He is alert and oriented to person, place, and time.  Skin: Skin is warm and dry.  As per measurements in history of present illness there is a relatively small and superficial and well defined subcutaneous firm lesion in the left inguina. There is a small open fistula draining a small amount of purulence. With manual pressure additional purulence was expressed. A culture was obtained. The size of the lesion was decreased significantly measuring approximately 1 cm across approximately the house of a marble. No overlying erythema. No cellulitis, no lymphangitis. No bleeding. No regional lymphadenopathy.  Psychiatric: He has a normal mood and affect.  Nursing note and vitals reviewed.   Urgent Care Course     Procedures (including critical care time)  Labs Review Labs Reviewed  AEROBIC CULTURE (SUPERFICIAL SPECIMEN)    Imaging Review No results found.   Visual Acuity Review  Right Eye Distance:   Left Eye Distance:   Bilateral Distance:    Right Eye Near:   Left Eye Near:    Bilateral Near:         MDM   1. Abscess    The size of the abscess has decreased. Most of the pus has been expressed. Allow the abscess to drain by applying warm compresses 3-4 times a day. Take the antibiotic as directed. They  should allow the abscess to heal. If there is any enlargement, redness, increase in size or other worsening seek medical attention promptly. After manual expressing additional purulence the size of the lesion was a little smaller than a marble. He was easy to palpate completely around the small sphere. I do not believe that incision would be of benefit. Most likely the combination of warm compresses and the antibiotics should clear this up. He is allergic to several antibiotics including sulfonamide. Meds ordered this encounter  Medications  . HYDROcodone-acetaminophen (NORCO/VICODIN) 5-325 MG per tablet 1 tablet  . clindamycin (CLEOCIN) 300 MG capsule    Sig: Take 1 capsule (300 mg total) by mouth 3 (three) times daily.    Dispense:  20 capsule    Refill:  0    Order Specific Question:   Supervising Provider    Answer:   Robyn Haber [[id number]]       Janne Napoleon, NP 08/24/16 1627

## 2016-08-24 NOTE — Telephone Encounter (Signed)
Patient's sister called, asking for an urgent appointment or advice. Patient had total knee replacement on 1/30, now has boil that popped up in his groin. Patient's sister states she would like him seen today.  We do not have any openings today, RN advised patient to try PCP or Urgent Care if he cannot be seen today. Patient's sister agreed. Dr. [name], please advise if patient needs clinic follow up. Landis Gandy, RN

## 2016-08-24 NOTE — ED Triage Notes (Signed)
Here for abscess left inguinal area... States he woke up and saw it this am  Sx include: pain and pus drainage, chills  A&O x4... NAD

## 2016-08-24 NOTE — Discharge Instructions (Signed)
The size of the abscess has decreased. Most of the pus has been expressed. Allow the abscess to drain by applying warm compresses 3-4 times a day. Take the antibiotic as directed. They should allow the abscess to heal. If there is any enlargement, redness, increase in size or other worsening seek medical attention promptly.

## 2016-08-25 DIAGNOSIS — L02214 Cutaneous abscess of groin: Secondary | ICD-10-CM | POA: Diagnosis not present

## 2016-08-25 NOTE — Telephone Encounter (Signed)
Urgent care eval is fine thanks

## 2016-08-27 ENCOUNTER — Telehealth: Payer: Self-pay | Admitting: Internal Medicine

## 2016-08-27 LAB — AEROBIC CULTURE  (SUPERFICIAL SPECIMEN)

## 2016-08-27 LAB — AEROBIC CULTURE W GRAM STAIN (SUPERFICIAL SPECIMEN)

## 2016-08-27 MED ORDER — CIPROFLOXACIN HCL 500 MG PO TABS: 500.0000 mg | ORAL_TABLET | Freq: Two times a day (BID) | ORAL | 0 refills | Status: DC

## 2016-08-27 NOTE — Telephone Encounter (Signed)
Clinical staff, please let patient know that groin wound culture was positive for E coli, which is not sensitive to clindamycin.  Rx cipro sent to pharmacy of record, [facility] at [place].  Stop clindamycin and start cipro.  Recheck or followup with PCP for increasing redness/swelling/pain/drainage from groin wound, new fever >100.5, or if not starting to improving in a few days.  LM

## 2016-08-28 ENCOUNTER — Other Ambulatory Visit: Payer: Self-pay | Admitting: *Deleted

## 2016-08-28 DIAGNOSIS — M25661 Stiffness of right knee, not elsewhere classified: Secondary | ICD-10-CM | POA: Diagnosis not present

## 2016-08-28 DIAGNOSIS — M25561 Pain in right knee: Secondary | ICD-10-CM | POA: Diagnosis not present

## 2016-08-28 DIAGNOSIS — R262 Difficulty in walking, not elsewhere classified: Secondary | ICD-10-CM | POA: Diagnosis not present

## 2016-08-28 DIAGNOSIS — J302 Other seasonal allergic rhinitis: Secondary | ICD-10-CM

## 2016-08-28 DIAGNOSIS — R531 Weakness: Secondary | ICD-10-CM | POA: Diagnosis not present

## 2016-08-28 MED ORDER — MONTELUKAST SODIUM 10 MG PO TABS: 10.0000 mg | ORAL_TABLET | Freq: Every day | ORAL | 5 refills | Status: DC

## 2016-08-29 DIAGNOSIS — L02214 Cutaneous abscess of groin: Secondary | ICD-10-CM | POA: Diagnosis not present

## 2016-08-29 DIAGNOSIS — L723 Sebaceous cyst: Secondary | ICD-10-CM | POA: Diagnosis not present

## 2016-08-29 DIAGNOSIS — R2241 Localized swelling, mass and lump, right lower limb: Secondary | ICD-10-CM | POA: Diagnosis not present

## 2016-08-30 DIAGNOSIS — M25661 Stiffness of right knee, not elsewhere classified: Secondary | ICD-10-CM | POA: Diagnosis not present

## 2016-08-30 DIAGNOSIS — M25561 Pain in right knee: Secondary | ICD-10-CM | POA: Diagnosis not present

## 2016-08-30 DIAGNOSIS — R262 Difficulty in walking, not elsewhere classified: Secondary | ICD-10-CM | POA: Diagnosis not present

## 2016-08-30 DIAGNOSIS — M1711 Unilateral primary osteoarthritis, right knee: Secondary | ICD-10-CM | POA: Diagnosis not present

## 2016-09-01 ENCOUNTER — Telehealth: Payer: Self-pay | Admitting: Infectious Diseases

## 2016-09-01 ENCOUNTER — Telehealth: Payer: Self-pay

## 2016-09-01 NOTE — Telephone Encounter (Signed)
Ms Casey Brennan did not answer her phone.  I asked she call back with a number she can be reached at so that we could speak regarding her concerns with the clinic.

## 2016-09-01 NOTE — Telephone Encounter (Signed)
Patient's sister is calling regarding a phone conversation she had with Sharyn Lull, RN on Monday.   According to April Mr. Marku was to be scheduled with Dr [name] on September 11, 2016.  April seems to think Sharyn Lull intentionally did not make the appointment.    I recalled the patient talking to Sharyn Lull about an acute issue that would need attention on that day and it was recommended he go to his primary physician since the first available appointment was 09-11-16.  I have shared this phone message with my Director and Dr. [name].  Dr [name] has agreed to speak with the patient.    Laverle Patter, RN

## 2016-09-02 ENCOUNTER — Telehealth (HOSPITAL_COMMUNITY): Payer: Self-pay | Admitting: Emergency Medicine

## 2016-09-02 NOTE — Telephone Encounter (Signed)
Returned pt's/sister (April) call.Verl Dicker on VM [phone]  She sts on message that they were not able to p/u Cipro but not sure why  Need clarification from pt or sister.

## 2016-09-04 ENCOUNTER — Encounter: Payer: Self-pay | Admitting: Neurology

## 2016-09-04 ENCOUNTER — Ambulatory Visit (INDEPENDENT_AMBULATORY_CARE_PROVIDER_SITE_OTHER): Payer: PRIVATE HEALTH INSURANCE | Admitting: Neurology

## 2016-09-04 VITALS — BP 140/94 | HR 100 | Resp 20 | Ht 69.0 in | Wt 222.0 lb

## 2016-09-04 DIAGNOSIS — G250 Essential tremor: Secondary | ICD-10-CM

## 2016-09-04 DIAGNOSIS — G63 Polyneuropathy in diseases classified elsewhere: Secondary | ICD-10-CM

## 2016-09-04 DIAGNOSIS — B20 Human immunodeficiency virus [HIV] disease: Secondary | ICD-10-CM | POA: Diagnosis not present

## 2016-09-04 DIAGNOSIS — M25661 Stiffness of right knee, not elsewhere classified: Secondary | ICD-10-CM | POA: Diagnosis not present

## 2016-09-04 DIAGNOSIS — G44229 Chronic tension-type headache, not intractable: Secondary | ICD-10-CM | POA: Diagnosis not present

## 2016-09-04 DIAGNOSIS — R262 Difficulty in walking, not elsewhere classified: Secondary | ICD-10-CM | POA: Diagnosis not present

## 2016-09-04 DIAGNOSIS — R252 Cramp and spasm: Secondary | ICD-10-CM | POA: Diagnosis not present

## 2016-09-04 DIAGNOSIS — M25561 Pain in right knee: Secondary | ICD-10-CM | POA: Diagnosis not present

## 2016-09-04 DIAGNOSIS — M1711 Unilateral primary osteoarthritis, right knee: Secondary | ICD-10-CM | POA: Diagnosis not present

## 2016-09-04 MED ORDER — HYDROXYZINE HCL 25 MG PO TABS: 25.0000 mg | ORAL_TABLET | Freq: Every evening | ORAL | 0 refills | Status: DC

## 2016-09-04 MED ORDER — ALPRAZOLAM 0.5 MG PO TABS: 0.5000 mg | ORAL_TABLET | Freq: Every evening | ORAL | 0 refills | Status: DC | PRN

## 2016-09-04 NOTE — Addendum Note (Signed)
Addended by: Lester Macy A on: 09/04/2016 10:00 AM   Modules accepted: Orders
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
## 2016-09-06 DIAGNOSIS — M25561 Pain in right knee: Secondary | ICD-10-CM | POA: Diagnosis not present

## 2016-09-06 DIAGNOSIS — R262 Difficulty in walking, not elsewhere classified: Secondary | ICD-10-CM | POA: Diagnosis not present

## 2016-09-06 DIAGNOSIS — M25661 Stiffness of right knee, not elsewhere classified: Secondary | ICD-10-CM | POA: Diagnosis not present

## 2016-09-06 DIAGNOSIS — M1711 Unilateral primary osteoarthritis, right knee: Secondary | ICD-10-CM | POA: Diagnosis not present

## 2016-09-08 DIAGNOSIS — M25561 Pain in right knee: Secondary | ICD-10-CM | POA: Diagnosis not present

## 2016-09-08 DIAGNOSIS — R531 Weakness: Secondary | ICD-10-CM | POA: Diagnosis not present

## 2016-09-08 DIAGNOSIS — M25661 Stiffness of right knee, not elsewhere classified: Secondary | ICD-10-CM | POA: Diagnosis not present

## 2016-09-08 DIAGNOSIS — R262 Difficulty in walking, not elsewhere classified: Secondary | ICD-10-CM | POA: Diagnosis not present

## 2016-09-11 DIAGNOSIS — R531 Weakness: Secondary | ICD-10-CM | POA: Diagnosis not present

## 2016-09-11 DIAGNOSIS — R262 Difficulty in walking, not elsewhere classified: Secondary | ICD-10-CM | POA: Diagnosis not present

## 2016-09-11 DIAGNOSIS — M25661 Stiffness of right knee, not elsewhere classified: Secondary | ICD-10-CM | POA: Diagnosis not present

## 2016-09-11 DIAGNOSIS — M25561 Pain in right knee: Secondary | ICD-10-CM | POA: Diagnosis not present

## 2016-09-12 ENCOUNTER — Other Ambulatory Visit: Payer: Self-pay | Admitting: General Surgery

## 2016-09-12 DIAGNOSIS — D2371 Other benign neoplasm of skin of right lower limb, including hip: Secondary | ICD-10-CM | POA: Diagnosis not present

## 2016-09-13 DIAGNOSIS — M25661 Stiffness of right knee, not elsewhere classified: Secondary | ICD-10-CM | POA: Diagnosis not present

## 2016-09-13 DIAGNOSIS — M25561 Pain in right knee: Secondary | ICD-10-CM | POA: Diagnosis not present

## 2016-09-13 DIAGNOSIS — M1711 Unilateral primary osteoarthritis, right knee: Secondary | ICD-10-CM | POA: Diagnosis not present

## 2016-09-13 DIAGNOSIS — R262 Difficulty in walking, not elsewhere classified: Secondary | ICD-10-CM | POA: Diagnosis not present

## 2016-09-15 DIAGNOSIS — R262 Difficulty in walking, not elsewhere classified: Secondary | ICD-10-CM | POA: Diagnosis not present

## 2016-09-15 DIAGNOSIS — M25561 Pain in right knee: Secondary | ICD-10-CM | POA: Diagnosis not present

## 2016-09-15 DIAGNOSIS — R531 Weakness: Secondary | ICD-10-CM | POA: Diagnosis not present

## 2016-09-15 DIAGNOSIS — M25661 Stiffness of right knee, not elsewhere classified: Secondary | ICD-10-CM | POA: Diagnosis not present

## 2016-09-18 DIAGNOSIS — R531 Weakness: Secondary | ICD-10-CM | POA: Diagnosis not present

## 2016-09-18 DIAGNOSIS — R262 Difficulty in walking, not elsewhere classified: Secondary | ICD-10-CM | POA: Diagnosis not present

## 2016-09-18 DIAGNOSIS — M25561 Pain in right knee: Secondary | ICD-10-CM | POA: Diagnosis not present

## 2016-09-18 DIAGNOSIS — M25661 Stiffness of right knee, not elsewhere classified: Secondary | ICD-10-CM | POA: Diagnosis not present

## 2016-09-19 ENCOUNTER — Telehealth: Payer: Self-pay | Admitting: Lab

## 2016-09-19 NOTE — Telephone Encounter (Signed)
Patient had a ppointment with [facility] on 09/12/2016 @ [phone].  He was a NO SHOW

## 2016-09-21 NOTE — Telephone Encounter (Signed)
April called today with her phone number - [phone]. This is the same phone number that has been in the patient's chart. Landis Gandy, RN

## 2016-09-22 DIAGNOSIS — D631 Anemia in chronic kidney disease: Secondary | ICD-10-CM | POA: Diagnosis not present

## 2016-09-22 DIAGNOSIS — N182 Chronic kidney disease, stage 2 (mild): Secondary | ICD-10-CM | POA: Diagnosis not present

## 2016-09-22 DIAGNOSIS — N2581 Secondary hyperparathyroidism of renal origin: Secondary | ICD-10-CM | POA: Diagnosis not present

## 2016-09-24 ENCOUNTER — Observation Stay (HOSPITAL_COMMUNITY)
Admission: EM | Admit: 2016-09-24 | Discharge: 2016-09-26 | Disposition: A | Discharge status: Home / Self Care | Payer: 59 | Attending: Internal Medicine | Admitting: Internal Medicine

## 2016-09-24 ENCOUNTER — Encounter (HOSPITAL_COMMUNITY): Payer: Self-pay

## 2016-09-24 ENCOUNTER — Emergency Department (HOSPITAL_COMMUNITY): Payer: 59

## 2016-09-24 DIAGNOSIS — R110 Nausea: Secondary | ICD-10-CM

## 2016-09-24 DIAGNOSIS — I129 Hypertensive chronic kidney disease with stage 1 through stage 4 chronic kidney disease, or unspecified chronic kidney disease: Secondary | ICD-10-CM | POA: Diagnosis not present

## 2016-09-24 DIAGNOSIS — Z882 Allergy status to sulfonamides status: Secondary | ICD-10-CM | POA: Insufficient documentation

## 2016-09-24 DIAGNOSIS — J440 Chronic obstructive pulmonary disease with acute lower respiratory infection: Secondary | ICD-10-CM | POA: Insufficient documentation

## 2016-09-24 DIAGNOSIS — R109 Unspecified abdominal pain: Secondary | ICD-10-CM

## 2016-09-24 DIAGNOSIS — J45909 Unspecified asthma, uncomplicated: Secondary | ICD-10-CM | POA: Diagnosis not present

## 2016-09-24 DIAGNOSIS — M25461 Effusion, right knee: Secondary | ICD-10-CM | POA: Diagnosis present

## 2016-09-24 DIAGNOSIS — Z91018 Allergy to other foods: Secondary | ICD-10-CM | POA: Insufficient documentation

## 2016-09-24 DIAGNOSIS — F0630 Mood disorder due to known physiological condition, unspecified: Secondary | ICD-10-CM | POA: Insufficient documentation

## 2016-09-24 DIAGNOSIS — K5909 Other constipation: Secondary | ICD-10-CM | POA: Insufficient documentation

## 2016-09-24 DIAGNOSIS — R51 Headache: Secondary | ICD-10-CM | POA: Insufficient documentation

## 2016-09-24 DIAGNOSIS — M1711 Unilateral primary osteoarthritis, right knee: Secondary | ICD-10-CM | POA: Diagnosis not present

## 2016-09-24 DIAGNOSIS — Z885 Allergy status to narcotic agent status: Secondary | ICD-10-CM | POA: Insufficient documentation

## 2016-09-24 DIAGNOSIS — Z8719 Personal history of other diseases of the digestive system: Secondary | ICD-10-CM | POA: Diagnosis not present

## 2016-09-24 DIAGNOSIS — Z8042 Family history of malignant neoplasm of prostate: Secondary | ICD-10-CM | POA: Insufficient documentation

## 2016-09-24 DIAGNOSIS — J189 Pneumonia, unspecified organism: Secondary | ICD-10-CM | POA: Diagnosis not present

## 2016-09-24 DIAGNOSIS — K529 Noninfective gastroenteritis and colitis, unspecified: Secondary | ICD-10-CM | POA: Diagnosis present

## 2016-09-24 DIAGNOSIS — Z79899 Other long term (current) drug therapy: Secondary | ICD-10-CM | POA: Diagnosis not present

## 2016-09-24 DIAGNOSIS — Z881 Allergy status to other antibiotic agents status: Secondary | ICD-10-CM | POA: Insufficient documentation

## 2016-09-24 DIAGNOSIS — Z880 Allergy status to penicillin: Secondary | ICD-10-CM | POA: Insufficient documentation

## 2016-09-24 DIAGNOSIS — R112 Nausea with vomiting, unspecified: Secondary | ICD-10-CM | POA: Diagnosis not present

## 2016-09-24 DIAGNOSIS — Z87442 Personal history of urinary calculi: Secondary | ICD-10-CM | POA: Insufficient documentation

## 2016-09-24 DIAGNOSIS — G43909 Migraine, unspecified, not intractable, without status migrainosus: Secondary | ICD-10-CM | POA: Insufficient documentation

## 2016-09-24 DIAGNOSIS — K5901 Slow transit constipation: Secondary | ICD-10-CM

## 2016-09-24 DIAGNOSIS — K429 Umbilical hernia without obstruction or gangrene: Secondary | ICD-10-CM | POA: Insufficient documentation

## 2016-09-24 DIAGNOSIS — L989 Disorder of the skin and subcutaneous tissue, unspecified: Secondary | ICD-10-CM | POA: Insufficient documentation

## 2016-09-24 DIAGNOSIS — Z8614 Personal history of Methicillin resistant Staphylococcus aureus infection: Secondary | ICD-10-CM | POA: Insufficient documentation

## 2016-09-24 DIAGNOSIS — Z8546 Personal history of malignant neoplasm of prostate: Secondary | ICD-10-CM | POA: Insufficient documentation

## 2016-09-24 DIAGNOSIS — Z9049 Acquired absence of other specified parts of digestive tract: Secondary | ICD-10-CM | POA: Insufficient documentation

## 2016-09-24 DIAGNOSIS — J181 Lobar pneumonia, unspecified organism: Secondary | ICD-10-CM

## 2016-09-24 DIAGNOSIS — H269 Unspecified cataract: Secondary | ICD-10-CM | POA: Insufficient documentation

## 2016-09-24 DIAGNOSIS — R319 Hematuria, unspecified: Secondary | ICD-10-CM | POA: Insufficient documentation

## 2016-09-24 DIAGNOSIS — I10 Essential (primary) hypertension: Secondary | ICD-10-CM | POA: Diagnosis present

## 2016-09-24 DIAGNOSIS — I491 Atrial premature depolarization: Secondary | ICD-10-CM | POA: Insufficient documentation

## 2016-09-24 DIAGNOSIS — X58XXXS Exposure to other specified factors, sequela: Secondary | ICD-10-CM | POA: Insufficient documentation

## 2016-09-24 DIAGNOSIS — D649 Anemia, unspecified: Secondary | ICD-10-CM | POA: Insufficient documentation

## 2016-09-24 DIAGNOSIS — L739 Follicular disorder, unspecified: Secondary | ICD-10-CM | POA: Diagnosis not present

## 2016-09-24 DIAGNOSIS — N281 Cyst of kidney, acquired: Secondary | ICD-10-CM | POA: Insufficient documentation

## 2016-09-24 DIAGNOSIS — J449 Chronic obstructive pulmonary disease, unspecified: Secondary | ICD-10-CM | POA: Diagnosis present

## 2016-09-24 DIAGNOSIS — N182 Chronic kidney disease, stage 2 (mild): Secondary | ICD-10-CM | POA: Insufficient documentation

## 2016-09-24 DIAGNOSIS — Z96651 Presence of right artificial knee joint: Secondary | ICD-10-CM | POA: Insufficient documentation

## 2016-09-24 DIAGNOSIS — G8929 Other chronic pain: Secondary | ICD-10-CM | POA: Insufficient documentation

## 2016-09-24 DIAGNOSIS — G609 Hereditary and idiopathic neuropathy, unspecified: Secondary | ICD-10-CM | POA: Insufficient documentation

## 2016-09-24 DIAGNOSIS — Z888 Allergy status to other drugs, medicaments and biological substances status: Secondary | ICD-10-CM | POA: Diagnosis not present

## 2016-09-24 DIAGNOSIS — K5900 Constipation, unspecified: Secondary | ICD-10-CM

## 2016-09-24 DIAGNOSIS — M25561 Pain in right knee: Secondary | ICD-10-CM | POA: Insufficient documentation

## 2016-09-24 DIAGNOSIS — Z7982 Long term (current) use of aspirin: Secondary | ICD-10-CM | POA: Insufficient documentation

## 2016-09-24 DIAGNOSIS — Z21 Asymptomatic human immunodeficiency virus [HIV] infection status: Secondary | ICD-10-CM | POA: Insufficient documentation

## 2016-09-24 DIAGNOSIS — Z833 Family history of diabetes mellitus: Secondary | ICD-10-CM | POA: Insufficient documentation

## 2016-09-24 DIAGNOSIS — S0990XS Unspecified injury of head, sequela: Secondary | ICD-10-CM | POA: Insufficient documentation

## 2016-09-24 DIAGNOSIS — K219 Gastro-esophageal reflux disease without esophagitis: Secondary | ICD-10-CM | POA: Diagnosis not present

## 2016-09-24 DIAGNOSIS — Z87891 Personal history of nicotine dependence: Secondary | ICD-10-CM | POA: Insufficient documentation

## 2016-09-24 LAB — BASIC METABOLIC PANEL
Anion gap: 11 (ref 5–15)
BUN: 10 mg/dL (ref 6–20)
CO2: 25 mmol/L (ref 22–32)
Calcium: 9.5 mg/dL (ref 8.9–10.3)
Chloride: 102 mmol/L (ref 101–111)
Creatinine, Ser: 1.36 mg/dL — ABNORMAL HIGH (ref 0.61–1.24)
GFR calc Af Amer: >60 mL/min (ref >60)
GFR calc non Af Amer: 58 mL/min — ABNORMAL LOW (ref >60)
Glucose, Bld: 92 mg/dL (ref 65–99)
Potassium: 3.7 mmol/L (ref 3.5–5.1)
Sodium: 138 mmol/L (ref 135–145)

## 2016-09-24 LAB — CBC
HCT: 38.4 % — ABNORMAL LOW (ref 39.0–52.0)
Hemoglobin: 12.9 g/dL — ABNORMAL LOW (ref 13.0–17.0)
MCH: 30.4 pg (ref 26.0–34.0)
MCHC: 33.6 g/dL (ref 30.0–36.0)
MCV: 90.6 fL (ref 78.0–100.0)
Platelets: 197 10*3/uL (ref 150–400)
RBC: 4.24 MIL/uL (ref 4.22–5.81)
RDW: 15.2 % (ref 11.5–15.5)
WBC: 6.8 10*3/uL (ref 4.0–10.5)

## 2016-09-24 LAB — I-STAT TROPONIN, ED: Troponin i, poc: 0 ng/mL (ref 0.00–0.08)

## 2016-09-24 MED ORDER — SODIUM CHLORIDE 0.9 % IV BOLUS (SEPSIS)
1000.0000 mL | Freq: Once | INTRAVENOUS | Status: AC
  Administered 2016-09-25: 1000 mL via INTRAVENOUS

## 2016-09-24 MED ORDER — ACETAMINOPHEN 325 MG PO TABS
650.0000 mg | ORAL_TABLET | Freq: Once | ORAL | Status: AC
  Administered 2016-09-25: 650 mg via ORAL
  Filled 2016-09-24: qty 2

## 2016-09-24 MED ORDER — HYDROMORPHONE HCL 1 MG/ML IJ SOLN
1.0000 mg | Freq: Once | INTRAMUSCULAR | Status: AC
  Administered 2016-09-25: 1 mg via INTRAVENOUS
  Filled 2016-09-24: qty 1

## 2016-09-24 NOTE — ED Triage Notes (Signed)
Pt complaining of L sided chest pain and SOB. Pt states coughing up yellow sputum. Pt states running fevers. Pt temp = 100 at triage.

## 2016-09-24 NOTE — ED Provider Notes (Signed)
[facility] DEPT Provider Note   CSN: [id number] Arrival date & time: 09/24/16  2120   By signing my name below, I, Delton Prairie, attest that this documentation has been prepared under the direction and in the presence of Drenda Freeze, MD  Electronically Signed: Delton Prairie, ED Scribe. 09/24/16. 11:54 PM.   History   Chief Complaint Chief Complaint  Patient presents with  . Chest Pain  . Shortness of Breath    HPI Comments:  Casey Brennan is a 56 y.o. male, with a  PMHx of HIV (CD4 normal in 04/2016) and past pneumonia, who presents to the Emergency Department complaining of acute onset chills x yesterday. Pt also reports a fever (tmax 102), productive cough with thick green/brown sputum, left sided chest pain, left shoulder blade pain, generalized body aches, SOB secondary to coughing, acute onset right knee swelling, right knee pain, abdominal swelling and abdominal pain. Sister notes the pt had as total right knee arthroplasty in 07/2016 for which he was temporarily placed on 0.40 mg of Lovenox. Pt has had Mucinex and alka seltzer plus with no relief. Pt denies a hx of heart problems, a hx of blood clots and any other associated symptoms. No other complaints noted at this time.    The history is provided by the patient. No language interpreter was used.    Past Medical History:  Diagnosis Date  . Arthritis    r knee   . Asthma    very rare  . Axillary lymphadenopathy   . Bronchitis   . Chronic anemia    normocytic  . Chronic folliculitis   . Elevated PSA   . Environmental and seasonal allergies   . GERD (gastroesophageal reflux disease)   . Gross hematuria   . H/O pericarditis    2010--  myopercarditis--  resolved  . HCAP (healthcare-associated pneumonia) 11/19/2014  . Headache(784.0)    HX SEVERE FRONTAL HA'S  . History of concussion    2012  &  2013  RESIDUAL HA'S --  RESOLVED  . History of gastric ulcer   . History of kidney stones   . History of MRSA  infection 2010   infected boil  . HIV (human immunodeficiency virus infection) ([place]) 1988  . Lytic bone lesion of hip    WORK-UP DONE BY ONCOLOGIST DR HA --  NOT MALIGNANT  . Pneumonia    hx of  . Post concussion syndrome    resolved  . Prostate cancer ([place]) 04/25/13   gleason 3+3=6, volume 30 gm  . Sinusitis, chronic 05/14/2015  . Ulcer ([place])    hx of gastric  . Wears glasses     Patient Active Problem List   Diagnosis Date Noted  . Primary osteoarthritis of right knee 08/01/2016  . Chronic renal insufficiency, stage 2 (mild) 05/22/2016  . Skin lesion of right upper extremity 02/22/2016  . Migraines 02/22/2016  . Low back pain 02/22/2016  . Osteoarthritis of right knee 01/11/2016  . Kidney stones 10/25/2015  . Sinusitis, chronic 05/14/2015  . Hereditary and idiopathic peripheral neuropathy 09/02/2014  . GERD (gastroesophageal reflux disease) 07/29/2014  . COPD GOLD III 07/22/2014  . Hx of primary hypertension 04/27/2014  . H/O prostate cancer   . Lytic bone lesion of hip 08/22/2012  . Cataract, nuclear 05/15/2012  . Asthma 01/23/2012  . HTN (hypertension) 04/17/2011  . Mood disorder in conditions classified elsewhere 06/14/2009  . Human immunodeficiency virus (HIV) disease ([place]) 04/19/2009    Past Surgical History:  Procedure Laterality Date  . CARDIAC CATHETERIZATION  03-23-2009  DR COOPER   NORMAL CORONARY ARTERIES  . CHOLECYSTECTOMY N/A 11/06/2014   Procedure: LAPAROSCOPIC CHOLECYSTECTOMY WITH INTRAOPERATIVE CHOLANGIOGRAM;  Surgeon: Judeth Horn, MD;  Location: [facility];  Service: General;  Laterality: N/A;  . COLONOSCOPY  12/26/2011   Procedure: COLONOSCOPY;  Surgeon: Lear Ng, MD;  Location: [facility];  Service: Endoscopy;  Laterality: N/A;  . COLONOSCOPY WITH PROPOFOL N/A 07/29/2014   Procedure: COLONOSCOPY WITH PROPOFOL;  Surgeon: Lear Ng, MD;  Location: [facility];  Service: Endoscopy;  Laterality: N/A;  .  CYSTOSCOPY/RETROGRADE/URETEROSCOPY Bilateral 04/25/2013   Procedure: CYSTOSCOPY/ BILATERAL RETROGRADES; BLADDER BIOPSIES;  Surgeon: Alexis Frock, MD;  Location[facility];  Service: Urology;  Laterality: Bilateral;  . DENTAL EXAMINATION UNDER ANESTHESIA    . ESOPHAGOGASTRODUODENOSCOPY  12/26/2011   Procedure: ESOPHAGOGASTRODUODENOSCOPY (EGD);  Surgeon: Lear Ng, MD;  Location: [facility];  Service: Endoscopy;  Laterality: N/A;  . ESOPHAGOGASTRODUODENOSCOPY (EGD) WITH PROPOFOL N/A 07/29/2014   Procedure: ESOPHAGOGASTRODUODENOSCOPY (EGD) WITH PROPOFOL;  Surgeon: Lear Ng, MD;  Location: [facility];  Service: Endoscopy;  Laterality: N/A;  . EXCISION CHRONIC LEFT BREAST ABSCESS  09-21-2010  . EXCISIONAL BX LEFT BREAST MASS/  I  &  D LEFT BREAST ABSCESS  03-24-2009  . KNEE ARTHROSCOPY Right 1985  . left axilla biopsy  04/2013  . left hip biopsy  10/2012  . LYMPHADENECTOMY Bilateral 08/18/2013   Procedure: LYMPHADENECTOMY "PELVIC LYMPH NODE DISSECTION";  Surgeon: Alexis Frock, MD;  Location: [facility];  Service: Urology;  Laterality: Bilateral;  . PROSTATE BIOPSY N/A 04/25/2013   Procedure: BIOPSY TRANSRECTAL ULTRASONIC PROSTATE (TUBP);  Surgeon: Alexis Frock, MD;  Location[facility];  Service: Urology;  Laterality: N/A;  . ROBOT ASSISTED LAPAROSCOPIC RADICAL PROSTATECTOMY N/A 08/18/2013   Procedure: ROBOTIC ASSISTED LAPAROSCOPIC RADICAL PROSTATECTOMY;  Surgeon: Alexis Frock, MD;  Location: [facility];  Service: Urology;  Laterality: N/A;  . TOTAL KNEE ARTHROPLASTY Right 08/01/2016  . TOTAL KNEE ARTHROPLASTY Right 08/01/2016   Procedure: TOTAL KNEE ARTHROPLASTY;  Surgeon: Renette Butters, MD;  Location: [facility];  Service: Orthopedics;  Laterality: Right;  . UPPER LEG SOFT TISSUE BIOPSY Left 2012   lthigh       Home Medications    Prior to Admission medications   Medication Sig Start Date End Date Taking? Authorizing Provider  albuterol  (PROVENTIL HFA;VENTOLIN HFA) 108 (90 Base) MCG/ACT inhaler Inhale 2 puffs into the lungs every 4 (four) hours as needed for wheezing or shortness of breath. 05/22/16   Campbell Riches, MD  albuterol (PROVENTIL) (2.5 MG/3ML) 0.083% nebulizer solution Take 3 mLs (2.5 mg total) by nebulization every 6 (six) hours as needed for wheezing or shortness of breath. 05/22/16   Campbell Riches, MD  ALPRAZolam Duanne Moron) 0.5 MG tablet Take 1 tablet (0.5 mg total) by mouth at bedtime as needed for anxiety. 09/04/16   Asencion Partridge Dohmeier, MD  amLODipine (NORVASC) 5 MG tablet Take 5 mg by mouth at bedtime.  04/20/16   Historical Provider, MD  aspirin EC 81 MG tablet Take 81 mg by mouth See admin instructions. Monday, Wednesday, & Saturday At bedtime    Historical Provider, MD  Beclomethasone Dipropionate 80 MCG/ACT AERS Place 2 sprays into the nose once. Patient taking differently: Place 1 spray into the nose 2 (two) times daily.  05/22/16 07/18/17  Campbell Riches, MD  ciprofloxacin (CIPRO) 500 MG tablet Take 1 tablet (500 mg total) by mouth 2 (two)  times daily. 08/27/16   Sherlene Shams, MD  Darunavir Ethanolate (PREZISTA) 800 MG tablet Take 1 tablet (800 mg total) by mouth daily. Patient taking differently: Take 800 mg by mouth at bedtime.  04/24/16   Campbell Riches, MD  diclofenac sodium (VOLTAREN) 1 % GEL APPLY TO THE AFFECTED AREA TWICE DAILY AS NEEDED Patient taking differently: Apply 1 application topically 2 (two) times daily as needed (pain).  02/05/15   Campbell Riches, MD  enoxaparin (LOVENOX) 40 MG/0.4ML injection Inject 0.4 mLs (40 mg total) into the skin daily. For 30 days post op for DVT prophylaxis 08/01/16 08/31/16  Prudencio Burly III, PA-C  furosemide (LASIX) 40 MG tablet Take 0.5 tablets (20 mg total) by mouth daily. Patient taking differently: Take 20 mg by mouth at bedtime.  02/22/16   Carly Montey Hora, MD  gabapentin (NEURONTIN) 300 MG capsule Take 1 capsule (300 mg total) by mouth 4 (four)  times daily as needed (nerve pain). Patient taking differently: Take 300 mg by mouth 3 (three) times daily as needed (nerve pain).  09/07/15   Dennie Bible, NP  hydrochlorothiazide (HYDRODIURIL) 25 MG tablet Take 1 tablet (25 mg total) by mouth daily. Patient taking differently: Take 25 mg by mouth at bedtime.  10/14/15   Konrad Felix, PA  hydrOXYzine (ATARAX/VISTARIL) 25 MG tablet Take 1 tablet (25 mg total) by mouth Nightly. 09/04/16   Carmen Dohmeier, MD  ipratropium (ATROVENT) 0.02 % nebulizer solution USE 2.5 ML(0.5 MG) VIA NEBULIZER THREE TIMES DAILY AS NEEDED FOR WHEEZING OR SHORTNESS OF BREATH 01/11/16   Carly J Rivet, MD  mometasone-formoterol (DULERA) 200-5 MCG/ACT AERO Take 2 puffs first thing in am and then another 2 puffs about 12 hours later. Patient taking differently: Inhale 1 puff into the lungs every 12 (twelve) hours.  07/22/14   Tanda Rockers, MD  montelukast (SINGULAIR) 10 MG tablet Take 1 tablet (10 mg total) by mouth at bedtime. 08/28/16   Campbell Riches, MD  Multiple Vitamin (MULTIVITAMIN WITH MINERALS) TABS tablet Take 1 tablet by mouth at bedtime.    Historical Provider, MD  mupirocin ointment (BACTROBAN) 2 % APPLY TO THE AFFECTED AREA TWICE DAILY FOR 2 WEEKS, PLACE COTTON BETWEEN TOES TO ALLOW BETTER AERATION AS NEEDED 08/22/16   Campbell Riches, MD  omeprazole (PRILOSEC) 20 MG capsule TAKE 1 CAPSULE(20 MG) BY MOUTH AT BEDTIME 08/15/16   Campbell Riches, MD  ondansetron (ZOFRAN) 4 MG tablet TAKE 1 TABLET BY MOUTH EVERY 8 HOURS AS NEEDED FOR NAUSEA AND VOMITING Patient taking differently: Take 4 mg by mouth every 4 (four) hours as needed for nausea or vomiting.  04/24/16   Campbell Riches, MD  ondansetron (ZOFRAN) 4 MG tablet Take 1 tablet (4 mg total) by mouth every 8 (eight) hours as needed for nausea or vomiting. 08/01/16   Prudencio Burly III, PA-C  oxyCODONE-acetaminophen (ROXICET) 5-325 MG tablet Take 1-2 tablets by mouth every 4 (four) hours as needed  for severe pain. 08/01/16   Charna Elizabeth Martensen III, PA-C  Polyethyl Glycol-Propyl Glycol (SYSTANE) 0.4-0.3 % GEL ophthalmic gel Place 1 application into both eyes daily.    Historical Provider, MD  SUMAtriptan (IMITREX) 100 MG tablet Take 1 tablet (100 mg total) by mouth every 2 (two) hours as needed for migraine. May repeat in 2 hours if headache persists or recurs. 08/07/16   Davonna Belling, MD  tizanidine (ZANAFLEX) 6 MG capsule Take 1 capsule (6 mg total) by  mouth 3 (three) times daily as needed for muscle spasms. 09/07/15   Dennie Bible, NP    Family History Family History  Problem Relation Age of Onset  . Stroke Father   . Diabetes Father   . Cancer Father     brain cancer  . Asthma Father   . Hypertension Sister   . Cancer Maternal Uncle     prostate cancer  . Asthma Sister     Social History Social History  Substance Use Topics  . Smoking status: Former Smoker    Packs/day: 1.50    Years: 23.00    Types: Cigars, Cigarettes    Quit date: 05/22/2010  . Smokeless tobacco: Never Used  . Alcohol use No     Allergies   Chocolate; Cocoa; Morphine and related; Penicillins; Sulfonamide derivatives; Tomato; Sulfa antibiotics; Tramadol; and Tylenol [acetaminophen]   Review of Systems Review of Systems  Constitutional: Positive for chills and fever.  Respiratory: Positive for cough and shortness of breath.   Cardiovascular: Positive for chest pain.  Gastrointestinal: Positive for abdominal pain.  Musculoskeletal: Positive for joint swelling and myalgias.  All other systems reviewed and are negative.  Physical Exam Updated Vital Signs BP (!) 163/96   Pulse (!) 104   Temp 100 F (37.8 C)   Resp 20   SpO2 100%   Physical Exam  Constitutional: He is oriented to person, place, and time. He appears well-developed and well-nourished.  HENT:  Head: Normocephalic and atraumatic.  Eyes: EOM are normal.  Neck: Normal range of motion.  Cardiovascular: Normal  rate, regular rhythm, normal heart sounds and intact distal pulses.   Pulmonary/Chest: Effort normal. No respiratory distress. He has decreased breath sounds.  Breath sounds diminished in the left base.   Abdominal: Soft. He exhibits no distension. There is tenderness in the suprapubic area. There is no CVA tenderness.  Mild diffuse tenderness in the abdomen more in the suprapubic region.    Musculoskeletal: Normal range of motion.  R knee swollen, able to range it. Previous knee replacement scar in place   Neurological: He is alert and oriented to person, place, and time.  Skin: Skin is warm and dry.  Psychiatric: He has a normal mood and affect. Judgment normal.  Nursing note and vitals reviewed.    ED Treatments / Results  DIAGNOSTIC STUDIES:  Oxygen Saturation is 100% on RA, normal by my interpretation.    COORDINATION OF CARE:  11:52 PM Discussed treatment plan with pt at bedside and pt agreed to plan.  Labs (all labs ordered are listed, but only abnormal results are displayed) Labs Reviewed  BASIC METABOLIC PANEL - Abnormal; Notable for the following:       Result Value   Creatinine, Ser 1.36 (*)    GFR calc non Af Amer 58 (*)    All other components within normal limits  CBC - Abnormal; Notable for the following:    Hemoglobin 12.9 (*)    HCT 38.4 (*)    All other components within normal limits  I-STAT TROPOININ, ED    EKG  EKG Interpretation  Date/Time:  Sunday September 24 2016 21:25:01 EDT Ventricular Rate:  109 PR Interval:  162 QRS Duration: 80 QT Interval:  332 QTC Calculation: 447 R Axis:   61 Text Interpretation:  Sinus tachycardia with Premature atrial complexes with Abberant conduction Nonspecific T wave abnormality Abnormal ECG rate faster than previous  Confirmed by Zhavia Cunanan  MD, Sammy Douthitt ([id number]) on 09/24/2016 11:31:03 PM  Radiology Dg Chest 2 View  Result Date: 09/24/2016 CLINICAL DATA:  Acute onset of generalized chest pain, fever, cough and  congestion. Initial encounter. EXAM: CHEST  2 VIEW COMPARISON:  Chest radiograph performed 10/07/2015 FINDINGS: The lungs are well-aerated and clear. There is no evidence of focal opacification, pleural effusion or pneumothorax. The heart is normal in size; the mediastinal contour is within normal limits. No acute osseous abnormalities are seen. IMPRESSION: No acute cardiopulmonary process seen. Electronically Signed   By: Garald Balding M.D.   On: 09/24/2016 22:31    Procedures .Joint Aspiration/Arthrocentesis Date/Time: 09/25/2016 3:17 AM Performed by: Drenda Freeze Authorized by: Drenda Freeze   Consent:    Consent obtained:  Verbal and written   Consent given by:  Patient and healthcare agent   Risks discussed:  Bleeding, infection and pain   Alternatives discussed:  No treatment Location:    Location:  Knee Anesthesia (see MAR for exact dosages):    Anesthesia method:  Topical application Procedure details:    Preparation: Patient was prepped and draped in usual sterile fashion     Needle gauge:  48 G   Ultrasound guidance: yes     Approach:  Anterior   Steroid injected: no     Specimen collected: no   Post-procedure details:    Dressing:  Adhesive bandage   Patient tolerance of procedure:  Tolerated well, no immediate complications Comments:     Unable to get any fluid from the joint     (including critical care time)     Medications Ordered in ED Medications - No data to display   Initial Impression / Assessment and Plan / ED Course  I have reviewed the triage vital signs and the nursing notes.  Pertinent labs & imaging results that were available during my care of the patient were reviewed by me and considered in my medical decision making (see chart for details).     Casey Brennan is a 56 y.o. male here with fever 102, shortness of breath, chest pain, abdominal pain, R knee swelling. Differential broad. Consider flu syndrome vs pneumonia vs colitis  vs PE, less likely septic knee. Will do sepsis workup. Will also get ESR, CRP, CT angio chest, CT ab/pel.   3:21 AM WBC nl. Lactate nl. Still very short of breath and borderline O2 92-93%. Still in severe pain despite multiple doses of pain meds. CT angio showed L sided pneumonia and some colitis. ESR and CRP only mildly elevated. I attempted arthrocentesis even with [place] but unable to get any fluid. I have low suspicion for septic joint and patient has pneumonia, colitis that can explain fevers. Ordered levaquin, flagyl. Will admit to teaching service. Can get ortho consult (Dr. Percell Miller) in the morning if patient still has knee swelling.    Final Clinical Impressions(s) / ED Diagnoses   Final diagnoses:  None    New Prescriptions New Prescriptions   No medications on file  I personally performed the services described in this documentation, which was scribed in my presence. The recorded information has been reviewed and is accurate.     Drenda Freeze, MD 09/25/16 [phone]

## 2016-09-25 ENCOUNTER — Emergency Department (HOSPITAL_COMMUNITY): Payer: 59

## 2016-09-25 ENCOUNTER — Encounter (HOSPITAL_COMMUNITY): Payer: Self-pay | Admitting: Radiology

## 2016-09-25 ENCOUNTER — Observation Stay (HOSPITAL_COMMUNITY): Payer: 59

## 2016-09-25 DIAGNOSIS — R140 Abdominal distension (gaseous): Secondary | ICD-10-CM | POA: Diagnosis not present

## 2016-09-25 DIAGNOSIS — J189 Pneumonia, unspecified organism: Secondary | ICD-10-CM | POA: Diagnosis not present

## 2016-09-25 DIAGNOSIS — Z7951 Long term (current) use of inhaled steroids: Secondary | ICD-10-CM

## 2016-09-25 DIAGNOSIS — J45909 Unspecified asthma, uncomplicated: Secondary | ICD-10-CM

## 2016-09-25 DIAGNOSIS — K6389 Other specified diseases of intestine: Secondary | ICD-10-CM | POA: Diagnosis not present

## 2016-09-25 DIAGNOSIS — I10 Essential (primary) hypertension: Secondary | ICD-10-CM

## 2016-09-25 DIAGNOSIS — R3129 Other microscopic hematuria: Secondary | ICD-10-CM

## 2016-09-25 DIAGNOSIS — Z79891 Long term (current) use of opiate analgesic: Secondary | ICD-10-CM | POA: Diagnosis not present

## 2016-09-25 DIAGNOSIS — Z96651 Presence of right artificial knee joint: Secondary | ICD-10-CM

## 2016-09-25 DIAGNOSIS — R110 Nausea: Secondary | ICD-10-CM

## 2016-09-25 DIAGNOSIS — M25561 Pain in right knee: Secondary | ICD-10-CM

## 2016-09-25 DIAGNOSIS — J181 Lobar pneumonia, unspecified organism: Secondary | ICD-10-CM

## 2016-09-25 LAB — CBC
HCT: 35.6 % — ABNORMAL LOW (ref 39.0–52.0)
Hemoglobin: 11.9 g/dL — ABNORMAL LOW (ref 13.0–17.0)
MCH: 30.3 pg (ref 26.0–34.0)
MCHC: 33.4 g/dL (ref 30.0–36.0)
MCV: 90.6 fL (ref 78.0–100.0)
Platelets: 178 10*3/uL (ref 150–400)
RBC: 3.93 MIL/uL — ABNORMAL LOW (ref 4.22–5.81)
RDW: 15.2 % (ref 11.5–15.5)
WBC: 5.2 10*3/uL (ref 4.0–10.5)

## 2016-09-25 LAB — URINALYSIS, ROUTINE W REFLEX MICROSCOPIC
Bacteria, UA: NONE SEEN
Bilirubin Urine: NEGATIVE
Glucose, UA: NEGATIVE mg/dL
Ketones, ur: NEGATIVE mg/dL
Nitrite: NEGATIVE
Protein, ur: 100 mg/dL — AB
Specific Gravity, Urine: 1.012 (ref 1.005–1.030)
pH: 6 (ref 5.0–8.0)

## 2016-09-25 LAB — COMPREHENSIVE METABOLIC PANEL
ALT: 28 U/L (ref 17–63)
AST: 18 U/L (ref 15–41)
Albumin: 3.5 g/dL (ref 3.5–5.0)
Alkaline Phosphatase: 106 U/L (ref 38–126)
Anion gap: 8 (ref 5–15)
BUN: 9 mg/dL (ref 6–20)
CO2: 24 mmol/L (ref 22–32)
Calcium: 8.7 mg/dL — ABNORMAL LOW (ref 8.9–10.3)
Chloride: 102 mmol/L (ref 101–111)
Creatinine, Ser: 1.16 mg/dL (ref 0.61–1.24)
GFR calc Af Amer: >60 mL/min (ref >60)
GFR calc non Af Amer: >60 mL/min (ref >60)
Glucose, Bld: 104 mg/dL — ABNORMAL HIGH (ref 65–99)
Potassium: 3.4 mmol/L — ABNORMAL LOW (ref 3.5–5.1)
Sodium: 134 mmol/L — ABNORMAL LOW (ref 135–145)
Total Bilirubin: 0.7 mg/dL (ref 0.3–1.2)
Total Protein: 6.7 g/dL (ref 6.5–8.1)

## 2016-09-25 LAB — RESPIRATORY PANEL BY PCR

## 2016-09-25 LAB — INFLUENZA PANEL BY PCR (TYPE A & B)
Influenza A By PCR: NEGATIVE
Influenza B By PCR: NEGATIVE

## 2016-09-25 LAB — SEDIMENTATION RATE: Sed Rate: 23 mm/hr — ABNORMAL HIGH (ref 0–16)

## 2016-09-25 LAB — HEPATIC FUNCTION PANEL
ALT: 35 U/L (ref 17–63)
AST: 29 U/L (ref 15–41)
Albumin: 4.5 g/dL (ref 3.5–5.0)
Alkaline Phosphatase: 133 U/L — ABNORMAL HIGH (ref 38–126)
Bilirubin, Direct: <0.1 mg/dL — ABNORMAL LOW (ref 0.1–0.5)
Total Bilirubin: 0.3 mg/dL (ref 0.3–1.2)
Total Protein: 8.1 g/dL (ref 6.5–8.1)

## 2016-09-25 LAB — C-REACTIVE PROTEIN: CRP: 1.2 mg/dL — ABNORMAL HIGH (ref <1.0)

## 2016-09-25 LAB — TROPONIN I: Troponin I: <0.03 ng/mL (ref <0.03)

## 2016-09-25 LAB — I-STAT CG4 LACTIC ACID, ED: Lactic Acid, Venous: 0.77 mmol/L (ref 0.5–1.9)

## 2016-09-25 LAB — STREP PNEUMONIAE URINARY ANTIGEN: Strep Pneumo Urinary Antigen: NEGATIVE

## 2016-09-25 MED ORDER — ONDANSETRON HCL 4 MG PO TABS: 4.0000 mg | ORAL_TABLET | Freq: Three times a day (TID) | ORAL | Status: DC | PRN

## 2016-09-25 MED ORDER — POLYETHYL GLYCOL-PROPYL GLYCOL 0.4-0.3 % OP GEL: 1.0000 "application " | Freq: Every day | OPHTHALMIC | Status: DC

## 2016-09-25 MED ORDER — ALPRAZOLAM 0.5 MG PO TABS
0.5000 mg | ORAL_TABLET | Freq: Every day | ORAL | Status: DC | PRN
  Administered 2016-09-25: 0.5 mg via ORAL
  Filled 2016-09-25: qty 1

## 2016-09-25 MED ORDER — TIZANIDINE HCL 4 MG PO TABS: 6.0000 mg | ORAL_TABLET | Freq: Three times a day (TID) | ORAL | Status: DC | PRN

## 2016-09-25 MED ORDER — DICLOFENAC SODIUM 1 % TD GEL: 1.0000 "application " | Freq: Two times a day (BID) | TRANSDERMAL | Status: DC | PRN

## 2016-09-25 MED ORDER — LEVOFLOXACIN 750 MG PO TABS
750.0000 mg | ORAL_TABLET | ORAL | Status: DC
  Administered 2016-09-26: 750 mg via ORAL
  Filled 2016-09-25: qty 1

## 2016-09-25 MED ORDER — BECLOMETHASONE DIPROP MONOHYD 42 MCG/SPRAY NA SUSP
1.0000 | Freq: Two times a day (BID) | NASAL | Status: DC
  Filled 2016-09-25: qty 25

## 2016-09-25 MED ORDER — ACETAMINOPHEN 650 MG RE SUPP: 650.0000 mg | Freq: Four times a day (QID) | RECTAL | Status: DC | PRN

## 2016-09-25 MED ORDER — HYDROXYZINE HCL 25 MG PO TABS
25.0000 mg | ORAL_TABLET | Freq: Every day | ORAL | Status: DC
  Administered 2016-09-25: 25 mg via ORAL
  Filled 2016-09-25: qty 1

## 2016-09-25 MED ORDER — POLYETHYLENE GLYCOL 3350 17 G PO PACK
17.0000 g | PACK | Freq: Every day | ORAL | Status: DC | PRN
Start: 2016-09-25 — End: 2016-09-26

## 2016-09-25 MED ORDER — SODIUM CHLORIDE 0.9 % IV SOLN
8.0000 mg | Freq: Three times a day (TID) | INTRAVENOUS | Status: DC | PRN
  Administered 2016-09-25: 8 mg via INTRAVENOUS
  Filled 2016-09-25: qty 4

## 2016-09-25 MED ORDER — METRONIDAZOLE IN NACL 5-0.79 MG/ML-% IV SOLN
500.0000 mg | Freq: Once | INTRAVENOUS | Status: AC
  Administered 2016-09-25: 500 mg via INTRAVENOUS
  Filled 2016-09-25: qty 100

## 2016-09-25 MED ORDER — HYDROCHLOROTHIAZIDE 25 MG PO TABS
25.0000 mg | ORAL_TABLET | Freq: Every day | ORAL | Status: DC
  Administered 2016-09-25 – 2016-09-26 (×2): 25 mg via ORAL
  Filled 2016-09-25 (×2): qty 1

## 2016-09-25 MED ORDER — ASPIRIN EC 81 MG PO TBEC
81.0000 mg | DELAYED_RELEASE_TABLET | ORAL | Status: DC
  Administered 2016-09-25: 81 mg via ORAL
  Filled 2016-09-25: qty 1

## 2016-09-25 MED ORDER — PANTOPRAZOLE SODIUM 40 MG PO TBEC
40.0000 mg | DELAYED_RELEASE_TABLET | Freq: Every day | ORAL | Status: DC
  Administered 2016-09-25 – 2016-09-26 (×2): 40 mg via ORAL
  Filled 2016-09-25 (×2): qty 1

## 2016-09-25 MED ORDER — POTASSIUM CHLORIDE CRYS ER 10 MEQ PO TBCR
30.0000 meq | EXTENDED_RELEASE_TABLET | Freq: Two times a day (BID) | ORAL | Status: AC
Start: 2016-09-25 — End: 2016-09-25
  Administered 2016-09-25 (×2): 30 meq via ORAL
  Filled 2016-09-25 (×2): qty 1

## 2016-09-25 MED ORDER — DIPHENHYDRAMINE HCL 50 MG/ML IJ SOLN
25.0000 mg | Freq: Once | INTRAMUSCULAR | Status: AC
  Administered 2016-09-25: 25 mg via INTRAVENOUS
  Filled 2016-09-25: qty 1

## 2016-09-25 MED ORDER — HYDROMORPHONE HCL 1 MG/ML IJ SOLN
1.0000 mg | Freq: Once | INTRAMUSCULAR | Status: AC
  Administered 2016-09-25: 1 mg via INTRAVENOUS
  Filled 2016-09-25: qty 1

## 2016-09-25 MED ORDER — GABAPENTIN 300 MG PO CAPS
300.0000 mg | ORAL_CAPSULE | Freq: Four times a day (QID) | ORAL | Status: DC | PRN
  Administered 2016-09-25 – 2016-09-26 (×3): 300 mg via ORAL
  Filled 2016-09-25 (×3): qty 1

## 2016-09-25 MED ORDER — HYDROCOD POLST-CPM POLST ER 10-8 MG/5ML PO SUER
5.0000 mL | Freq: Two times a day (BID) | ORAL | Status: DC | PRN
  Administered 2016-09-25 – 2016-09-26 (×2): 5 mL via ORAL
  Filled 2016-09-25 (×2): qty 5

## 2016-09-25 MED ORDER — EMTRICITABINE-TENOFOVIR AF 200-25 MG PO TABS
1.0000 | ORAL_TABLET | Freq: Every day | ORAL | Status: DC
  Filled 2016-09-25 (×2): qty 1

## 2016-09-25 MED ORDER — IPRATROPIUM-ALBUTEROL 0.5-2.5 (3) MG/3ML IN SOLN
3.0000 mL | Freq: Four times a day (QID) | RESPIRATORY_TRACT | Status: DC
  Filled 2016-09-25: qty 3

## 2016-09-25 MED ORDER — IPRATROPIUM-ALBUTEROL 0.5-2.5 (3) MG/3ML IN SOLN
3.0000 mL | Freq: Once | RESPIRATORY_TRACT | Status: AC
  Administered 2016-09-25: 3 mL via RESPIRATORY_TRACT
  Filled 2016-09-25: qty 3

## 2016-09-25 MED ORDER — ACETAMINOPHEN 325 MG PO TABS: 650.0000 mg | ORAL_TABLET | Freq: Four times a day (QID) | ORAL | Status: DC | PRN

## 2016-09-25 MED ORDER — AMLODIPINE BESYLATE 5 MG PO TABS
5.0000 mg | ORAL_TABLET | Freq: Every day | ORAL | Status: DC
  Administered 2016-09-25: 5 mg via ORAL
  Filled 2016-09-25: qty 1

## 2016-09-25 MED ORDER — FAMOTIDINE 20 MG PO TABS
20.0000 mg | ORAL_TABLET | Freq: Once | ORAL | Status: AC
  Administered 2016-09-25: 20 mg via ORAL
  Filled 2016-09-25: qty 1

## 2016-09-25 MED ORDER — RITONAVIR 100 MG PO TABS
100.0000 mg | ORAL_TABLET | Freq: Every day | ORAL | Status: DC
  Administered 2016-09-25: 100 mg via ORAL
  Filled 2016-09-25: qty 1

## 2016-09-25 MED ORDER — METOCLOPRAMIDE HCL 10 MG PO TABS: 10.0000 mg | ORAL_TABLET | Freq: Three times a day (TID) | ORAL | Status: DC | PRN

## 2016-09-25 MED ORDER — DIPHENHYDRAMINE HCL 50 MG/ML IJ SOLN
12.5000 mg | Freq: Once | INTRAMUSCULAR | Status: AC
Start: 2016-09-25 — End: 2016-09-25
  Administered 2016-09-25: 12.5 mg via INTRAVENOUS
  Filled 2016-09-25: qty 1

## 2016-09-25 MED ORDER — TIZANIDINE HCL 6 MG PO CAPS: 6.0000 mg | ORAL_CAPSULE | Freq: Three times a day (TID) | ORAL | Status: DC | PRN

## 2016-09-25 MED ORDER — IOPAMIDOL (ISOVUE-370) INJECTION 76%
INTRAVENOUS | Status: AC
  Administered 2016-09-25: 100 mL
  Filled 2016-09-25: qty 100

## 2016-09-25 MED ORDER — IPRATROPIUM-ALBUTEROL 0.5-2.5 (3) MG/3ML IN SOLN
3.0000 mL | Freq: Four times a day (QID) | RESPIRATORY_TRACT | Status: DC | PRN
  Administered 2016-09-25 – 2016-09-26 (×3): 3 mL via RESPIRATORY_TRACT
  Filled 2016-09-25 (×3): qty 3

## 2016-09-25 MED ORDER — MOMETASONE FUROATE 50 MCG/ACT NA SUSP
2.0000 | Freq: Every day | NASAL | Status: DC
  Filled 2016-09-25: qty 17

## 2016-09-25 MED ORDER — MOMETASONE FURO-FORMOTEROL FUM 200-5 MCG/ACT IN AERO
1.0000 | INHALATION_SPRAY | Freq: Two times a day (BID) | RESPIRATORY_TRACT | Status: DC
  Administered 2016-09-25: 1 via RESPIRATORY_TRACT
  Filled 2016-09-25: qty 8.8

## 2016-09-25 MED ORDER — SODIUM CHLORIDE 0.9% FLUSH
3.0000 mL | Freq: Two times a day (BID) | INTRAVENOUS | Status: DC
  Administered 2016-09-25: 3 mL via INTRAVENOUS

## 2016-09-25 MED ORDER — ADULT MULTIVITAMIN W/MINERALS CH
1.0000 | ORAL_TABLET | Freq: Every day | ORAL | Status: DC
  Administered 2016-09-25: 1 via ORAL
  Filled 2016-09-25: qty 1

## 2016-09-25 MED ORDER — BECLOMETHASONE DIPROPIONATE 80 MCG/ACT NA AERS: 2.0000 | INHALATION_SPRAY | Freq: Once | NASAL | Status: DC

## 2016-09-25 MED ORDER — LIDOCAINE HCL 2 % IJ SOLN
10.0000 mL | Freq: Once | INTRAMUSCULAR | Status: AC
  Administered 2016-09-25: 200 mg via INTRADERMAL
  Filled 2016-09-25: qty 20

## 2016-09-25 MED ORDER — IBUPROFEN 600 MG PO TABS: 600.0000 mg | ORAL_TABLET | Freq: Four times a day (QID) | ORAL | Status: DC | PRN

## 2016-09-25 MED ORDER — SENNOSIDES-DOCUSATE SODIUM 8.6-50 MG PO TABS
1.0000 | ORAL_TABLET | Freq: Two times a day (BID) | ORAL | Status: DC
  Administered 2016-09-25 (×2): 1 via ORAL
  Filled 2016-09-25 (×2): qty 1

## 2016-09-25 MED ORDER — FENTANYL CITRATE (PF) 100 MCG/2ML IJ SOLN
50.0000 ug | Freq: Once | INTRAMUSCULAR | Status: AC
  Administered 2016-09-25: 50 ug via INTRAVENOUS
  Filled 2016-09-25: qty 2

## 2016-09-25 MED ORDER — OXYCODONE-ACETAMINOPHEN 5-325 MG PO TABS
1.0000 | ORAL_TABLET | ORAL | Status: DC | PRN
  Administered 2016-09-25 – 2016-09-26 (×5): 2 via ORAL
  Filled 2016-09-25 (×5): qty 2

## 2016-09-25 MED ORDER — ENOXAPARIN SODIUM 40 MG/0.4ML ~~LOC~~ SOLN
40.0000 mg | SUBCUTANEOUS | Status: DC
  Administered 2016-09-25: 40 mg via SUBCUTANEOUS
  Filled 2016-09-25 (×2): qty 0.4

## 2016-09-25 MED ORDER — SUMATRIPTAN SUCCINATE 50 MG PO TABS
50.0000 mg | ORAL_TABLET | Freq: Two times a day (BID) | ORAL | Status: DC | PRN
  Administered 2016-09-25 (×2): 50 mg via ORAL
  Filled 2016-09-25 (×3): qty 1

## 2016-09-25 MED ORDER — ARTIFICIAL TEARS OP OINT
TOPICAL_OINTMENT | Freq: Every day | OPHTHALMIC | Status: DC
  Administered 2016-09-25 – 2016-09-26 (×2): via OPHTHALMIC
  Filled 2016-09-25: qty 3.5

## 2016-09-25 MED ORDER — METOCLOPRAMIDE HCL 5 MG/ML IJ SOLN: 10.0000 mg | Freq: Three times a day (TID) | INTRAMUSCULAR | Status: DC | PRN

## 2016-09-25 MED ORDER — RALTEGRAVIR POTASSIUM 400 MG PO TABS
800.0000 mg | ORAL_TABLET | Freq: Every day | ORAL | Status: DC
  Administered 2016-09-25: 800 mg via ORAL
  Filled 2016-09-25 (×2): qty 2

## 2016-09-25 MED ORDER — DARUNAVIR ETHANOLATE 800 MG PO TABS
800.0000 mg | ORAL_TABLET | Freq: Every day | ORAL | Status: DC
  Administered 2016-09-25: 800 mg via ORAL
  Filled 2016-09-25: qty 1

## 2016-09-25 MED ORDER — EMTRICITABINE-TENOFOVIR DF 200-300 MG PO TABS
1.0000 | ORAL_TABLET | Freq: Every day | ORAL | Status: DC
  Filled 2016-09-25: qty 1

## 2016-09-25 MED ORDER — MONTELUKAST SODIUM 10 MG PO TABS
10.0000 mg | ORAL_TABLET | Freq: Every day | ORAL | Status: DC
  Administered 2016-09-25: 10 mg via ORAL
  Filled 2016-09-25: qty 1

## 2016-09-25 MED ORDER — ALPRAZOLAM 0.5 MG PO TABS
0.5000 mg | ORAL_TABLET | Freq: Every evening | ORAL | Status: DC | PRN
Start: 2016-09-25 — End: 2016-09-25

## 2016-09-25 MED ORDER — ONDANSETRON HCL 4 MG PO TABS
8.0000 mg | ORAL_TABLET | Freq: Three times a day (TID) | ORAL | Status: DC | PRN
Start: 2016-09-25 — End: 2016-09-25

## 2016-09-25 MED ORDER — DICLOFENAC SODIUM 1 % TD GEL: 4.0000 g | Freq: Four times a day (QID) | TRANSDERMAL | Status: DC

## 2016-09-25 MED ORDER — PROMETHAZINE HCL 25 MG PO TABS
12.5000 mg | ORAL_TABLET | Freq: Four times a day (QID) | ORAL | Status: DC | PRN
  Administered 2016-09-25 – 2016-09-26 (×2): 12.5 mg via ORAL
  Filled 2016-09-25 (×2): qty 1

## 2016-09-25 MED ORDER — LEVOFLOXACIN IN D5W 750 MG/150ML IV SOLN
750.0000 mg | Freq: Once | INTRAVENOUS | Status: AC
  Administered 2016-09-25: 750 mg via INTRAVENOUS
  Filled 2016-09-25: qty 150

## 2016-09-25 MED ORDER — NON FORMULARY: 200.0000 mg | Freq: Every day | Status: DC

## 2016-09-25 NOTE — Progress Notes (Signed)
I was paged by Alyssa Grove regarding Mr. Ala's confusion about the orders. He was confused as to why he had CBGs ordered when he did not recall ever being diagnosed with diabetes. He was also told he would get a regular diet and was confused as to why droplet precautions where restarted after he tested negative for influenza.  In the interest of his care, I will go ahead and make these changes to his orders in the hopes he will stay here long enough for the morning team to round on him. I let her know they should be up there within the next few hours.

## 2016-09-25 NOTE — Progress Notes (Signed)
Family Medicine paged per Pt's sister's request regarding Dilaudid. MD called back, stated Percocet was Pt's home med and Dilaudid would not be ordered. Per MD will order NSAID instead to supplement Percocet. Will continue to monitor.

## 2016-09-25 NOTE — Progress Notes (Signed)
Arrived to unit from ED with sister present at bedside.  Oriented to room and unit.  Awaiting MD orders at this time.  Will continue to monitor.

## 2016-09-25 NOTE — Progress Notes (Signed)
   Subjective: Nausea is worse this morning and he vomited once.  Abdomen becoming more distended.  Breathing stable.  Objective:  Vital signs in last 24 hours: Vitals:   09/25/16 0415 09/25/16 0445 09/25/16 0500 09/25/16 0553  BP: (!) 131/93 126/86 (!) 131/91 137/89  Pulse: 94 92 96 88  Resp:    18  Temp:    99 F (37.2 C)  TempSrc:      SpO2: 95% 95% 98% 98%   Physical Exam  Constitutional: He is oriented to person, place, and time. He appears well-developed and well-nourished. No distress.  Cardiovascular: Normal rate and regular rhythm.   Pulmonary/Chest: Effort normal and breath sounds normal. No respiratory distress. He has no wheezes. He has no rales.  Abdominal:  Soft, nontender Distended, protuberant Tympanic over epigastrum Hypoactive bowel sounds  Musculoskeletal:  R knee with mild generalized edema and calor No erythema, tenderness to palpation Good active ROM Medial arthocentesis puncture without bleeding or exudate  Neurological: He is alert and oriented to person, place, and time.  Psychiatric: He has a normal mood and affect. His behavior is normal.   CBC Latest Ref Rng & Units 09/25/2016 09/24/2016 08/07/2016  WBC 4.0 - 10.5 K/uL 5.2 6.8 10.1  Hemoglobin 13.0 - 17.0 g/dL 11.9(L) 12.9(L) 12.4(L)  Hematocrit 39.0 - 52.0 % 35.6(L) 38.4(L) 36.2(L)  Platelets 150 - 400 K/uL 178 197 248   CMP Latest Ref Rng & Units 09/25/2016 09/24/2016 08/07/2016  Glucose 65 - 99 mg/dL 104(H) 92 111(H)  BUN 6 - 20 mg/dL 9 10 16   Creatinine 0.61 - 1.24 mg/dL 1.16 1.36(H) 1.18  Sodium 135 - 145 mmol/L 134(L) 138 135  Potassium 3.5 - 5.1 mmol/L 3.4(L) 3.7 3.6  Chloride 101 - 111 mmol/L 102 102 96(L)  CO2 22 - 32 mmol/L 24 25 28   Calcium 8.9 - 10.3 mg/dL 8.7(L) 9.5 8.9  Total Protein 6.5 - 8.1 g/dL 6.7 8.1 -  Total Bilirubin 0.3 - 1.2 mg/dL 0.7 0.3 -  Alkaline Phos 38 - 126 U/L 106 133(H) -  AST 15 - 41 U/L 18 29 -  ALT 17 - 63 U/L 28 35 -     Assessment/Plan:  Active  Problems:   HTN (hypertension)   Asthma   COPD GOLD III   CAP (community acquired pneumonia)  56 y.o. male with history of COPD and recent R TKA who presents with several days of fevers, cough, and dyspnea, but also abdominal pain and distention, myalgias and right knee pain.  He has well-controlled HIV (CD4 560 and undetectable VL in 04/2016) and is influenza negative.  He is hemodynamically stable, breathing well on room air, but not tolerating oral intake well.  #Fever #Pneumonia Cough, fever, dyspnea, and mild LUL infiltrates may represent pneumonia. -Continue levofloxacin -F/u RVP -F/u BCx  #Abdominal Distention #Nausea CT read with sigmoid wall thickening, possible mild colitis.  With distention and nausea, concern for possible evolving ileus.  On chronic opiates. -KUB -Zofran PRN -PO as tolerated -Levofloxacin for empiric coverage of pneumonia and colitis -Continue home doc/senna  #Asthma -DuoNebs Q6H PRN -Continue home Nasonex, Dulera, Singulair  #Right Knee Pain S/p TKA by Dr Fredonia Highland on 08/01/16. -Ortho consult [facility]  #HTN -Continue home HCTZ and amlodipine -Hold lasix  #Microscopic Hematuria -Repeat UA as o/p  Fluids: none Diet: regular DVT Prophylaxis: lovenox Code Status: full  Dispo: Anticipated discharge in approximately 1-2 day(s).   Minus Liberty, MD 09/25/2016, 6:51 AM Pager: [phone]

## 2016-09-25 NOTE — ED Notes (Signed)
Patient c/o itching all over.  Has received 50 ml's of ordered flagyl.  Stopped at this time.  To notify physician.

## 2016-09-25 NOTE — Progress Notes (Signed)
Shortly after arriving to unit, pt and pt's sister had several questions regarding care.  Answered questions to the best of my ability, however, pt still requested to speak with physician ASAP.  Covering provider paged via [place].  Call back received from Dr. Posey Pronto.  Updated Dr. Posey Pronto on pt concerns.  Orders received.  Updated pt and pt's sister during bedside report, informing them the team would be rounding within the next few hours per Dr. Posey Pronto.  Pt verbalized concerns addressed to his satisfaction at this time.  Report given to Sarah, RN, and pt left in her care.  Of note, charge RN and Therapist, sports notified and aware of pt concerns.

## 2016-09-25 NOTE — Evaluation (Signed)
Physical Therapy Evaluation Patient Details Name: Casey Brennan MRN: [id number] DOB: Feb 17, 1961 Today's Date: 09/25/2016   History of Present Illness  56 y.o. male with history of COPD and recent R TKA (08/01/16) who presents with several days of fevers, cough, and dyspnea, but also abdominal pain and distention, myalgias and right knee pain.  He has well-controlled HIV (CD4 560 and undetectable VL in 04/2016) and is influenza negative.   Clinical Impression  Pt admitted with above diagnosis. Pt currently with functional limitations due to the deficits listed below (see PT Problem List). On eval, pt required supervision for transfers and ambulation in room without AD. Mobility limited by nausea and vomiting. Pt will benefit from skilled PT to increase their independence and safety with mobility to allow discharge to the venue listed below.       Follow Up Recommendations Outpatient PT;Supervision - Intermittent    Equipment Recommendations  None recommended by PT    Recommendations for Other Services       Precautions / Restrictions Precautions Precautions: None      Mobility  Bed Mobility Overal bed mobility: Modified Independent                Transfers Overall transfer level: Needs assistance Equipment used: None Transfers: Sit to/from Stand;Stand Pivot Transfers Sit to Stand: Supervision Stand pivot transfers: Supervision       General transfer comment: supervision for safety only  Ambulation/Gait Ambulation/Gait assistance: Supervision Ambulation Distance (Feet): 10 Feet (x 2) Assistive device: None Gait Pattern/deviations: Step-through pattern;Antalgic;Decreased stride length;Decreased stance time - right Gait velocity: decreased Gait velocity interpretation: Below normal speed for age/gender General Gait Details: Steady gait. No LOB. Antalgic pattern noted.  Stairs            Wheelchair Mobility    Modified Rankin (Stroke Patients Only)        Balance Overall balance assessment: No apparent balance deficits (not formally assessed)                                           Pertinent Vitals/Pain Pain Assessment: Faces Faces Pain Scale: Hurts little more Pain Location: R knee Pain Descriptors / Indicators: Sore;Tightness Pain Intervention(s): Monitored during session;Ice applied    Home Living Family/patient expects to be discharged to:: Private residence Living Arrangements: Other relatives (sister) Available Help at Discharge: Family;Available 24 hours/day Type of Home: House Home Access: Stairs to enter Entrance Stairs-Rails: Can reach both;Right;Left Entrance Stairs-Number of Steps: 4 Home Layout: One level Home Equipment: Walker - 2 wheels;Cane - single point      Prior Function Level of Independence: Independent         Comments: Pt had progressed to independent level without AD following R TKA in 07/2016. He was receiving OPPT at time of admission.      Hand Dominance   Dominant Hand: Right    Extremity/Trunk Assessment   Upper Extremity Assessment Upper Extremity Assessment: Overall WFL for tasks assessed    Lower Extremity Assessment Lower Extremity Assessment: RLE deficits/detail RLE: Unable to fully assess due to pain    Cervical / Trunk Assessment Cervical / Trunk Assessment: Normal  Communication   Communication: No difficulties  Cognition Arousal/Alertness: Awake/alert Behavior During Therapy: WFL for tasks assessed/performed Overall Cognitive Status: Within Functional Limits for tasks assessed  General Comments      Exercises     Assessment/Plan    PT Assessment Patient needs continued PT services  PT Problem List Decreased activity tolerance;Decreased mobility;Pain       PT Treatment Interventions Gait training;Stair training;Functional mobility training;Therapeutic activities;Therapeutic  exercise;Patient/family education;Balance training    PT Goals (Current goals can be found in the Care Plan section)  Acute Rehab PT Goals Patient Stated Goal: home PT Goal Formulation: With patient Time For Goal Achievement: 10/09/16 Potential to Achieve Goals: Good    Frequency Min 3X/week   Barriers to discharge        Co-evaluation               End of Session   Activity Tolerance: Treatment limited secondary to medical complications (Comment) (N&V) Patient left: in bed;with call bell/phone within reach;with family/visitor present Nurse Communication: Mobility status PT Visit Diagnosis: Pain;Difficulty in walking, not elsewhere classified (R26.2) Pain - Right/Left: Right Pain - part of body: Knee    Time: 6553-7482 PT Time Calculation (min) (ACUTE ONLY): 11 min   Charges:   PT Evaluation $PT Eval Moderate Complexity: 1 Procedure     PT G Codes:   PT G-Codes **NOT FOR INPATIENT CLASS** Functional Assessment Tool Used: AM-PAC 6 Clicks Basic Mobility Functional Limitation: Mobility: Walking and moving around Mobility: Walking and Moving Around Current Status (L0786): At least 1 percent but less than 20 percent impaired, limited or restricted Mobility: Walking and Moving Around Goal Status [phone]): 0 percent impaired, limited or restricted    [name], PT  Office # [phone] Pager [phone]   [name] 09/25/2016, 12:04 PM

## 2016-09-25 NOTE — Progress Notes (Signed)
Of note, pt refuses admission questions at this time.  Will notify day shift RN.

## 2016-09-25 NOTE — Care Management Obs Status (Signed)
[facility] NOTIFICATION   Patient Details  Name: NAYEL PURDY MRN: [id number] Date of Birth: [birth year]   Medicare Observation Status Notification Given:  Yes    Sharin Mons, RN 09/25/2016, 5:05 PM

## 2016-09-25 NOTE — H&P (Signed)
Date: 09/25/2016               Patient Name:  Casey Brennan MRN: [id number]  DOB: [birth year] Age / Sex: 56 y.o., male   PCP: Jule Ser, DO         Medical Service: Internal Medicine Teaching Service         Attending Physician: Dr. Drenda Freeze, MD    First Contact: Dr. Inda Castle Pager: [phone]  Second Contact: Dr. Marlowe Sax Pager: [phone]       After Hours (After 5p/  First Contact Pager: [phone]  weekends / holidays): Second Contact Pager: [phone]   Chief Complaint. SOB,: Fever, chills,productive cough with thick green/brown sputum, left sided chest pain.  History of Present Illness: Casey Brennan is a 56 y.o. male, with a  PMHx of HIV (CD4 normal in 04/2016)Prostate cancer and COPD Gold III came to the ED With complaint of shortness of breath, worsening cough with yellow sputum, headache, generalized malaise along with fever and chills since Friday.  Patient was in his usual state of health when he developed shortness of breath associated with coughing, worsening cough with yellow sputum, chest congestion along with chills and fever up to 102 since Friday. He was also complaining of mild nausea without any vomitus. He denies any chest pain, palpitations, orthopnea or PND. Patient to complaint of left shoulder pain radiating to back off his shoulder blade, increased with deep breathing and any movement on that shoulder since Friday.Denies any recent lifting or injury. He also complained of right knee pain and swelling. Patient had total right knee orthoplasty done in 08/01/16, since then he is having intermittent worsening of swelling and pain. He was also complaining of abdominal swelling, looks like going on for some time, do indoors mild nausea for last 2 days, denies any vomiting, diarrhea or constipation. His last bowel movement was on Saturday. He also denies any dysuria or hematuria.  In ED he was found to have low-grade fever of 100, no leukocytosis and  negative chest x-ray. CTA was done which shows mild left upper lobe possible pneumonia. He also had CT abdomen done which shows mild wall thickening along the distal sigmoid colon.  Meds:  Current Meds  Medication Sig  . albuterol (PROVENTIL HFA;VENTOLIN HFA) 108 (90 Base) MCG/ACT inhaler Inhale 2 puffs into the lungs every 4 (four) hours as needed for wheezing or shortness of breath.  [name] albuterol (PROVENTIL) (2.5 MG/3ML) 0.083% nebulizer solution Take 3 mLs (2.5 mg total) by nebulization every 6 (six) hours as needed for wheezing or shortness of breath.  . ALPRAZolam (XANAX) 0.5 MG tablet Take 1 tablet (0.5 mg total) by mouth at bedtime as needed for anxiety. (Patient taking differently: Take 0.5 mg by mouth at bedtime. )  . amLODipine (NORVASC) 5 MG tablet Take 5 mg by mouth at bedtime.   [name] aspirin EC 81 MG tablet Take 81 mg by mouth See admin instructions. Monday, Wednesday, & Saturday At bedtime  . Beclomethasone Dipropionate 80 MCG/ACT AERS Place 2 sprays into the nose once. (Patient taking differently: Place 1 spray into the nose 2 (two) times daily. )  . Darunavir Ethanolate (PREZISTA) 800 MG tablet Take 1 tablet (800 mg total) by mouth daily. (Patient taking differently: Take 800 mg by mouth at bedtime. )  . diclofenac sodium (VOLTAREN) 1 % GEL APPLY TO THE AFFECTED AREA TWICE DAILY AS NEEDED (Patient taking differently: Apply 1 application topically 2 (two) times daily as  needed (pain). )  . furosemide (LASIX) 40 MG tablet Take 0.5 tablets (20 mg total) by mouth daily. (Patient taking differently: Take 20 mg by mouth at bedtime. )  . gabapentin (NEURONTIN) 300 MG capsule Take 1 capsule (300 mg total) by mouth 4 (four) times daily as needed (nerve pain). (Patient taking differently: Take 300 mg by mouth 3 (three) times daily as needed (nerve pain). )  . hydrochlorothiazide (HYDRODIURIL) 25 MG tablet Take 1 tablet (25 mg total) by mouth daily. (Patient taking differently: Take 25 mg by mouth at  bedtime. )  . hydrOXYzine (ATARAX/VISTARIL) 25 MG tablet Take 1 tablet (25 mg total) by mouth Nightly.  [name] ipratropium (ATROVENT) 0.02 % nebulizer solution USE 2.5 ML(0.5 MG) VIA NEBULIZER THREE TIMES DAILY AS NEEDED FOR WHEEZING OR SHORTNESS OF BREATH  . ISENTRESS 400 MG tablet Take 800 mg by mouth at bedtime.  . mometasone-formoterol (DULERA) 200-5 MCG/ACT AERO Take 2 puffs first thing in am and then another 2 puffs about 12 hours later. (Patient taking differently: Inhale 1 puff into the lungs every 12 (twelve) hours. )  . montelukast (SINGULAIR) 10 MG tablet Take 1 tablet (10 mg total) by mouth at bedtime.  . Multiple Vitamin (MULTIVITAMIN WITH MINERALS) TABS tablet Take 1 tablet by mouth at bedtime.  . mupirocin ointment (BACTROBAN) 2 % APPLY TO THE AFFECTED AREA TWICE DAILY FOR 2 WEEKS, PLACE COTTON BETWEEN TOES TO ALLOW BETTER AERATION AS NEEDED  . NORVIR 100 MG TABS tablet Take 100 mg by mouth at bedtime.  [name] omeprazole (PRILOSEC) 40 MG capsule Take 40 mg by mouth at bedtime.   . ondansetron (ZOFRAN) 4 MG tablet Take 1 tablet (4 mg total) by mouth every 8 (eight) hours as needed for nausea or vomiting.  [name] oxyCODONE-acetaminophen (ROXICET) 5-325 MG tablet Take 1-2 tablets by mouth every 4 (four) hours as needed for severe pain.  Vladimir Faster Glycol-Propyl Glycol (SYSTANE) 0.4-0.3 % GEL ophthalmic gel Place 1 application into both eyes daily.  . SUMAtriptan (IMITREX) 100 MG tablet Take 1 tablet (100 mg total) by mouth every 2 (two) hours as needed for migraine. May repeat in 2 hours if headache persists or recurs.  . tizanidine (ZANAFLEX) 6 MG capsule Take 1 capsule (6 mg total) by mouth 3 (three) times daily as needed for muscle spasms.  . TRUVADA 200-300 MG tablet Take 1 tablet by mouth at bedtime.  [name] zolpidem (AMBIEN CR) 12.5 MG CR tablet Take 12.5 mg by mouth at bedtime.     Allergies: Allergies as of 09/24/2016 - Review Complete 09/24/2016  Allergen Reaction Noted  . Chocolate  Shortness Of Breath and Other (See Comments) 05/22/2016  . Cocoa Shortness Of Breath and Other (See Comments) 05/22/2016  . Morphine and related Shortness Of Breath, Itching, and Other (See Comments) 06/13/2013  . Penicillins Shortness Of Breath and Rash   . Sulfonamide derivatives Shortness Of Breath and Rash   . Tomato Shortness Of Breath 05/22/2016  . Sulfa antibiotics  10/17/2015  . Tramadol Itching and Nausea And Vomiting 07/28/2014  . Tylenol [acetaminophen] Rash 11/11/2014   Past Medical History:  Diagnosis Date  . Arthritis    r knee   . Asthma    very rare  . Axillary lymphadenopathy   . Bronchitis   . Chronic anemia    normocytic  . Chronic folliculitis   . Elevated PSA   . Environmental and seasonal allergies   . GERD (gastroesophageal reflux disease)   . Gross hematuria   .  H/O pericarditis    2010--  myopercarditis--  resolved  . HCAP (healthcare-associated pneumonia) 11/19/2014  . Headache(784.0)    HX SEVERE FRONTAL HA'S  . History of concussion    2012  &  2013  RESIDUAL HA'S --  RESOLVED  . History of gastric ulcer   . History of kidney stones   . History of MRSA infection 2010   infected boil  . HIV (human immunodeficiency virus infection) ([facility]) 1988  . Hypertension   . Lytic bone lesion of hip    WORK-UP DONE BY ONCOLOGIST DR HA --  NOT MALIGNANT  . Pneumonia    hx of  . Post concussion syndrome    resolved  . Prostate cancer ([place]) 04/25/13   gleason 3+3=6, volume 30 gm  . Sinusitis, chronic 05/14/2015  . Ulcer ([facility])    hx of gastric  . Wears glasses     Family History: Mom had diabetes and maternal uncle had prostate cancer.  Social History: Former smoker-quit in 2011. Denies any alcohol or illicit drug use.  Review of Systems: A complete ROS was negative except as per HPI.   Physical Exam: Blood pressure 128/83, pulse 89, temperature 100 F (37.8 C), resp. rate 20, SpO2 96 %. Vitals:   09/24/16 2128 09/25/16 0015 09/25/16 0245  09/25/16 0315  BP: (!) 163/96 (!) 142/98 128/88 128/83  Pulse: (!) 104 99 92 89  Resp: 20     Temp: 100 F (37.8 C)     SpO2: 100% 96% 93% 96%   General: Vital signs reviewed.  Patient is well-developed and well-nourished, in no acute distress and cooperative with exam.  Head: Normocephalic and atraumatic. Eyes: EOMI, conjunctivae normal, no scleral icterus.  Neck: Supple, trachea midline, normal ROM, no JVD, masses, thyromegaly, or carotid bruit present.  Cardiovascular: RRR, S1 normal, S2 normal, no murmurs, gallops, or rubs. Pulmonary/Chest: Clear to auscultation bilaterally, no wheezes, rales, or rhonchi. Abdominal: Soft, non-tender, non-distended, BS +, no masses, organomegaly, or guarding present.  Musculoskeletal:Left shoulder. No erythema, edema or increase in temperature. Full range of motion with some subjective complaint of pain. Right knee. Mild edema and tenderness. No change in temperature. Clean bandage on medial right knee. Restricted range of motion because of pain. Right ankle. No erythema, edema, full range of motion with some subjective complaint of pain. Extremities: No lower extremity edema bilaterally,  pulses symmetric and intact bilaterally. No cyanosis or clubbing. Neurological: A&O x3, Strength is normal and symmetric bilaterally, cranial nerve II-XII are grossly intact, no focal motor deficit, sensory intact to light touch bilaterally.  Skin: Warm, dry and intact. No rashes or erythema.  Labs. CBC Latest Ref Rng & Units 09/24/2016 08/07/2016 07/21/2016  WBC 4.0 - 10.5 K/uL 6.8 10.1 7.3  Hemoglobin 13.0 - 17.0 g/dL 12.9(L) 12.4(L) 12.7(L)  Hematocrit 39.0 - 52.0 % 38.4(L) 36.2(L) 37.8(L)  Platelets 150 - 400 K/uL 197 248 180   CMP Latest Ref Rng & Units 09/24/2016 08/07/2016 07/21/2016  Glucose 65 - 99 mg/dL 92 111(H) 97  BUN 6 - 20 mg/dL 10 16 13   Creatinine 0.61 - 1.24 mg/dL 1.36(H) 1.18 1.32(H)  Sodium 135 - 145 mmol/L 138 135 137  Potassium 3.5 - 5.1 mmol/L 3.7  3.6 3.5  Chloride 101 - 111 mmol/L 102 96(L) 101  CO2 22 - 32 mmol/L 25 28 27   Calcium 8.9 - 10.3 mg/dL 9.5 8.9 9.6  Total Protein 6.5 - 8.1 g/dL 8.1 - -  Total Bilirubin 0.3 - 1.2 mg/dL  0.3 - -  Alkaline Phos 38 - 126 U/L 133(H) - -  AST 15 - 41 U/L 29 - -  ALT 17 - 63 U/L 35 - -   ESR. 23 C- Reactive Protein.1.2 Troponin. 0.00 Influenza PCR. Negative Lactic acid. 0.77  EKG: Sinus tachycardia, no acute change. QTc 444  CXR: FINDINGS: The lungs are well-aerated and clear. There is no evidence of focal opacification, pleural effusion or pneumothorax.  The heart is normal in size; the mediastinal contour is within normal limits. No acute osseous abnormalities are seen.  IMPRESSION: No acute cardiopulmonary process seen.  FINDINGS: CTA CHEST FINDINGS  Cardiovascular: Evaluation of the pulmonary arteries is markedly suboptimal due to limitations in the timing of the contrast bolus. Vague low-attenuation defect adjacent to the left main pulmonary artery is thought to reflect an underlying lymph node, as it does not appear to be in the vessel on coronal images.  The heart is normal in size. The thoracic aorta is grossly unremarkable. The great vessels are within normal limits.  Mediastinum/Nodes: The mediastinum is unremarkable in appearance. No mediastinal lymphadenopathy is seen. No pericardial effusion is identified. The thyroid gland is unremarkable. No axillary lymphadenopathy is seen.  Lungs/Pleura: Mild nodular opacities at the posterior aspect of the left upper lobe are concerning for mild pneumonia. No pleural effusion or pneumothorax is seen. No masses are identified.  Musculoskeletal: No acute osseous abnormalities are identified. The visualized musculature is unremarkable in appearance.  Review of the MIP images confirms the above findings.  CT ABDOMEN and PELVIS FINDINGS  Hepatobiliary: The liver is unremarkable in appearance. The patient is status post  cholecystectomy, with clips noted at the gallbladder fossa. The common bile duct remains normal in caliber.  Pancreas: The pancreas is within normal limits.  Spleen: The spleen is unremarkable in appearance.  Adrenals/Urinary Tract: A 1.8 cm right adrenal nodule is noted. The left adrenal gland is grossly unremarkable.  Scattered bilateral renal cysts are seen. The kidneys are otherwise grossly unremarkable. There is no evidence of hydronephrosis. No renal or ureteral stones are identified. No perinephric stranding is seen.  Stomach/Bowel: The stomach is unremarkable in appearance. The small bowel is within normal limits. The appendix is not visualized; there is no evidence for appendicitis.  Mild wall thickening is noted along the distal sigmoid colon, raising question for a mild infectious or inflammatory process. The colon is otherwise unremarkable in appearance.  Vascular/Lymphatic: The abdominal aorta is unremarkable in appearance. The inferior vena cava is grossly unremarkable. No retroperitoneal lymphadenopathy is seen. No pelvic sidewall lymphadenopathy is identified.  Reproductive: The bladder is mildly distended and grossly unremarkable. The patient is status post prostatectomy.  Other: A tiny periumbilical hernia is seen, containing only fat.  Musculoskeletal: No acute osseous abnormalities are identified. The visualized musculature is unremarkable in appearance.  Review of the MIP images confirms the above findings.  IMPRESSION: 1. Markedly suboptimal evaluation of the pulmonary arteries. No central pulmonary embolus characterized. 2. Mild nodular opacities at the posterior aspect of the left upper lung lobe are concerning for mild pneumonia. 3. Mild wall thickening along the distal sigmoid colon, raising question for mild infectious or inflammatory process. 4. Stable 1.8 cm right adrenal nodule likely reflects an adrenal adenoma. 5. Tiny periumbilical  hernia, containing only fat.  Assessment and plan. Casey Brennan is a 55 y.o. male, with a  PMHx of HIV (CD4 normal in 04/2016)Prostate cancer and COPD Gold III came to the ED With complaint of shortness  of breath, worsening cough with yellow sputum, headache, generalized malaise along with fever and chills since Friday.  Possible left upper lobe pneumonia. Patient does not have any leukocytosis, one reading of temperature at 100, chest x-ray was negative, CTA shows possible mild left upper lung lobe pneumonia. Influenza PCR negative. His well's criteria for PE was 0. He did get Lovenox 40 mg daily for 30 days postoperatively as DVT prophylaxis. CTA was done in ED. He might be having evolving pneumonia. He was given a dose of Levaquin and Flagyl IV in the ED. He did started itching with Flagyl requiring early termination of infusion. He is having mildly elevated ESR and CRP-most likely due to chronic inflammation going on in his right knee. -Admit to telemetry for now. -Continue Levaquin. -Strep pneumonia antigen. -Respiratory viral panel.  COPD exacerbation. He had some features of COPD exacerbation-increase cough with change in sputum production. He was having some wheeze initially in ED. We did not appreciate any wheezing. According to patient he was using his rescue nebulization and inhaler more than usual with minimum relief. He was given a DuoNeb nebulizer treatment in ED. -Continue DuoNeb when necessary. -Continue albuterol when necessary. -Continue home dose of Singulair and Dulera.  Right knee pain. Patient had total knee arthroplasty done on 08/01/2016.Arthrocentesis done in ED was unsuccessful to drain any fluid. He is experiencing intermittent swelling and pain of right knee since his surgery. He uses Neurontin  and Percocet for his knee pain. Which was provided by [facility] orthopedic. It looks less likely to have an infectious etiology of right knee pain at this time. -Continue  home dose of Neurontin and Percocet. [facility] can be consulted.  Left shoulder pain. It might be because of his arthritis or rotator cuff tendinitis. He was having full range of motion with some subjective complaint of pain. Exam was not positive for any acute inflammatory etiology. Although less likely but cardiac pain can manifest like this. His i-STAT troponin was negative and EKG was without any acute change. We will check troponin once-can be trended if needed. -He was placed on home dose of Neurontin and Percocet for his chronic right knee pain, that should help with left shoulder pain too. -Might get benefit from outpatient physical therapy.  Right ankle pain. Exam was benign except some subjective complaint of pain. According to patient pain get worse with walking around. He was also complained of tenderness to touch. He does not has any history of gout. Most likely because of his arthritis.  Abdominal swelling/colitis. On further interviewing it looks like he is having progressively worsening abdominal swelling for some time. Denies any change in bowel movement, chronic nausea or vomiting, and change in his appetite. His CT abdomen was showing mild thickening along the distal sigmoid colon-might be of mild infectious etiology-less likely. He was given a dose of Flagyl in ED, which causes generalized itching requiring early termination of infusion. Most likely he his viral versus atypical bacterial pneumonia can be responsible for his GI symptoms. He had colonoscopy in January 2016 which shows mild internal hemorrhoids, and follow-up in 10 years.  Mild worsening of his CKD. 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 Follow-up with Dr. Posey Pronto for his nephrology needs. Creatinine at 1.36, close to his baseline of 1.2-1.32. -Follow up BMP.  Hypertension. Patient was on amlodipine 5 mg daily, hydrochlorothiazide 25 mg daily and Lasix 20 mg daily. -Continue  hydrochlorothiazide and amlodipine. -Hold Lasix.  History of HIV. Compliant  with his medications. Follow-up with Dr. [name]. Last CD count on in October 2017 was normal. -Continue home meds.  History of prostate cancer. He has his prostatectomy done in 2015. Follow-up with oncology, no recurrence.  DVT prophylaxis. Lovenox CODE STATUS. Full Diet. Heart healthy.  Dispo: Admit patient to Observation with expected length of stay less than 2 midnights.  Signed: Lorella Nimrod, MD 09/25/2016, 3:43 AM  Pager: [phone]

## 2016-09-26 ENCOUNTER — Other Ambulatory Visit: Payer: Self-pay | Admitting: Neurology

## 2016-09-26 DIAGNOSIS — Z883 Allergy status to other anti-infective agents status: Secondary | ICD-10-CM

## 2016-09-26 DIAGNOSIS — G63 Polyneuropathy in diseases classified elsewhere: Principal | ICD-10-CM

## 2016-09-26 DIAGNOSIS — R252 Cramp and spasm: Secondary | ICD-10-CM

## 2016-09-26 DIAGNOSIS — R3129 Other microscopic hematuria: Secondary | ICD-10-CM | POA: Diagnosis not present

## 2016-09-26 DIAGNOSIS — G44229 Chronic tension-type headache, not intractable: Secondary | ICD-10-CM

## 2016-09-26 DIAGNOSIS — Z880 Allergy status to penicillin: Secondary | ICD-10-CM

## 2016-09-26 DIAGNOSIS — Z885 Allergy status to narcotic agent status: Secondary | ICD-10-CM | POA: Diagnosis not present

## 2016-09-26 DIAGNOSIS — Z881 Allergy status to other antibiotic agents status: Secondary | ICD-10-CM | POA: Diagnosis not present

## 2016-09-26 DIAGNOSIS — K5900 Constipation, unspecified: Secondary | ICD-10-CM

## 2016-09-26 DIAGNOSIS — Z91018 Allergy to other foods: Secondary | ICD-10-CM

## 2016-09-26 DIAGNOSIS — J449 Chronic obstructive pulmonary disease, unspecified: Secondary | ICD-10-CM | POA: Diagnosis not present

## 2016-09-26 DIAGNOSIS — I10 Essential (primary) hypertension: Secondary | ICD-10-CM | POA: Diagnosis not present

## 2016-09-26 DIAGNOSIS — Z96651 Presence of right artificial knee joint: Secondary | ICD-10-CM | POA: Diagnosis not present

## 2016-09-26 DIAGNOSIS — J189 Pneumonia, unspecified organism: Secondary | ICD-10-CM | POA: Diagnosis not present

## 2016-09-26 DIAGNOSIS — Z882 Allergy status to sulfonamides status: Secondary | ICD-10-CM | POA: Diagnosis not present

## 2016-09-26 DIAGNOSIS — B20 Human immunodeficiency virus [HIV] disease: Secondary | ICD-10-CM

## 2016-09-26 DIAGNOSIS — G250 Essential tremor: Secondary | ICD-10-CM

## 2016-09-26 DIAGNOSIS — M25561 Pain in right knee: Secondary | ICD-10-CM | POA: Diagnosis not present

## 2016-09-26 LAB — BASIC METABOLIC PANEL
Anion gap: 10 (ref 5–15)
BUN: 11 mg/dL (ref 6–20)
CO2: 23 mmol/L (ref 22–32)
Calcium: 9 mg/dL (ref 8.9–10.3)
Chloride: 102 mmol/L (ref 101–111)
Creatinine, Ser: 1.29 mg/dL — ABNORMAL HIGH (ref 0.61–1.24)
GFR calc Af Amer: >60 mL/min (ref >60)
GFR calc non Af Amer: >60 mL/min (ref >60)
Glucose, Bld: 112 mg/dL — ABNORMAL HIGH (ref 65–99)
Potassium: 4.3 mmol/L (ref 3.5–5.1)
Sodium: 135 mmol/L (ref 135–145)

## 2016-09-26 LAB — CBC
HCT: 36.9 % — ABNORMAL LOW (ref 39.0–52.0)
Hemoglobin: 12.4 g/dL — ABNORMAL LOW (ref 13.0–17.0)
MCH: 30.5 pg (ref 26.0–34.0)
MCHC: 33.6 g/dL (ref 30.0–36.0)
MCV: 90.9 fL (ref 78.0–100.0)
Platelets: 193 10*3/uL (ref 150–400)
RBC: 4.06 MIL/uL — ABNORMAL LOW (ref 4.22–5.81)
RDW: 15.1 % (ref 11.5–15.5)
WBC: 4.9 10*3/uL (ref 4.0–10.5)

## 2016-09-26 MED ORDER — METOCLOPRAMIDE HCL 10 MG PO TABS: 10.0000 mg | ORAL_TABLET | Freq: Three times a day (TID) | ORAL | 0 refills | Status: DC | PRN

## 2016-09-26 MED ORDER — POLYETHYLENE GLYCOL 3350 17 G PO PACK: 17.0000 g | PACK | Freq: Every day | ORAL | 0 refills | Status: DC | PRN

## 2016-09-26 MED ORDER — HYDROCOD POLST-CPM POLST ER 10-8 MG/5ML PO SUER: 5.0000 mL | Freq: Two times a day (BID) | ORAL | 0 refills | Status: DC | PRN

## 2016-09-26 MED ORDER — LEVOFLOXACIN 750 MG PO TABS: 750.0000 mg | ORAL_TABLET | ORAL | 0 refills | Status: DC

## 2016-09-26 MED ORDER — SENNOSIDES-DOCUSATE SODIUM 8.6-50 MG PO TABS
2.0000 | ORAL_TABLET | Freq: Two times a day (BID) | ORAL | Status: DC
  Filled 2016-09-26: qty 2

## 2016-09-26 MED ORDER — SENNOSIDES-DOCUSATE SODIUM 8.6-50 MG PO TABS: 2.0000 | ORAL_TABLET | Freq: Two times a day (BID) | ORAL | 0 refills | Status: DC

## 2016-09-26 NOTE — Progress Notes (Signed)
Pt given discharge instructions, prescriptions, and care notes. Pt verbalized understanding AEB no further questions or concerns at this time. IV was discontinued, no redness, pain, or swelling noted at this time. Telemetry discontinued and Centralized Telemetry was notified. Pt left the floor via wheelchair with staff in stable condition. 

## 2016-09-26 NOTE — Consult Note (Signed)
ORTHOPAEDIC CONSULTATION  REQUESTING PHYSICIAN: Aldine Contes, MD  Chief Complaint: Right knee pain s/p Right TKA 08/01/16  Assessment: Active Problems:   HTN (hypertension)   Asthma   COPD GOLD III   CAP (community acquired pneumonia)  Right knee pain: Very low suspicion for knee infection or acute abnormality.   No redness, appreciable effusion, or tenderness. He has been able to ambulate without aid or significant pain. AFVSN WBC wnl Blood cx ngtd  Plan: Continue to treat PNA per medicine Recommend conservative use of narcotic medicines to reduce impact on GI upset, constipation, and respiratory drive. Ice and elevate prn WBAT  Follow up in the office with Dr. Alain Marion as scheduled.  Please call with questions.  HPI: Casey Brennan is a 56 y.o. male who complains of fever, shortness of breath, and left sided chest pain for about 4 days.  Diagnosed with PNA.  Antibiotic treatment initiated.  Right knee pain s/p TKA about 2 months ago also noted.  Arthrocentesis was attempted in the ED, but did not produce sample. He has had significantly increased function recently. He has been ambulating without aid and has been driving.   Orthopedics was consulted to evaluate his knee.  Past Medical History:  Diagnosis Date  . Arthritis    r knee   . Asthma    very rare  . Axillary lymphadenopathy   . Bronchitis   . Chronic anemia    normocytic  . Chronic folliculitis   . Elevated PSA   . Environmental and seasonal allergies   . GERD (gastroesophageal reflux disease)   . Gross hematuria   . H/O pericarditis    2010--  myopercarditis--  resolved  . HCAP (healthcare-associated pneumonia) 11/19/2014  . Headache(784.0)    HX SEVERE FRONTAL HA'S  . History of concussion    2012  &  2013  RESIDUAL HA'S --  RESOLVED  . History of gastric ulcer   . History of kidney stones   . History of MRSA infection 2010   infected boil  . HIV (human immunodeficiency virus  infection) ([place]) 1988  . Hypertension   . Lytic bone lesion of hip    WORK-UP DONE BY ONCOLOGIST DR HA --  NOT MALIGNANT  . Pneumonia    hx of  . Post concussion syndrome    resolved  . Prostate cancer ([place]) 04/25/13   gleason 3+3=6, volume 30 gm  . Sinusitis, chronic 05/14/2015  . Ulcer ([place])    hx of gastric  . Wears glasses    Past Surgical History:  Procedure Laterality Date  . CARDIAC CATHETERIZATION  03-23-2009  DR COOPER   NORMAL CORONARY ARTERIES  . CHOLECYSTECTOMY N/A 11/06/2014   Procedure: LAPAROSCOPIC CHOLECYSTECTOMY WITH INTRAOPERATIVE CHOLANGIOGRAM;  Surgeon: Judeth Horn, MD;  Location: [place];  Service: General;  Laterality: N/A;  . COLONOSCOPY  12/26/2011   Procedure: COLONOSCOPY;  Surgeon: Lear Ng, MD;  Location: [facility];  Service: Endoscopy;  Laterality: N/A;  . COLONOSCOPY WITH PROPOFOL N/A 07/29/2014   Procedure: COLONOSCOPY WITH PROPOFOL;  Surgeon: Lear Ng, MD;  Location: [place];  Service: Endoscopy;  Laterality: N/A;  . CYSTOSCOPY/RETROGRADE/URETEROSCOPY Bilateral 04/25/2013   Procedure: CYSTOSCOPY/ BILATERAL RETROGRADES; BLADDER BIOPSIES;  Surgeon: Alexis Frock, MD;  Location[facility];  Service: Urology;  Laterality: Bilateral;  . DENTAL EXAMINATION UNDER ANESTHESIA    . ESOPHAGOGASTRODUODENOSCOPY  12/26/2011   Procedure: ESOPHAGOGASTRODUODENOSCOPY (EGD);  Surgeon: Lear Ng, MD;  Location: [facility];  Service: Endoscopy;  Laterality: N/A;  . ESOPHAGOGASTRODUODENOSCOPY (EGD) WITH PROPOFOL N/A 07/29/2014   Procedure: ESOPHAGOGASTRODUODENOSCOPY (EGD) WITH PROPOFOL;  Surgeon: Lear Ng, MD;  Location: [place];  Service: Endoscopy;  Laterality: N/A;  . EXCISION CHRONIC LEFT BREAST ABSCESS  09-21-2010  . EXCISIONAL BX LEFT BREAST MASS/  I  &  D LEFT BREAST ABSCESS  03-24-2009  . KNEE ARTHROSCOPY Right 1985  . left axilla biopsy  04/2013  . left hip biopsy  10/2012  . LYMPHADENECTOMY  Bilateral 08/18/2013   Procedure: LYMPHADENECTOMY "PELVIC LYMPH NODE DISSECTION";  Surgeon: Alexis Frock, MD;  Location: [facility];  Service: Urology;  Laterality: Bilateral;  . PROSTATE BIOPSY N/A 04/25/2013   Procedure: BIOPSY TRANSRECTAL ULTRASONIC PROSTATE (TUBP);  Surgeon: Alexis Frock, MD;  Location: [facility];  Service: Urology;  Laterality: N/A;  . ROBOT ASSISTED LAPAROSCOPIC RADICAL PROSTATECTOMY N/A 08/18/2013   Procedure: ROBOTIC ASSISTED LAPAROSCOPIC RADICAL PROSTATECTOMY;  Surgeon: Alexis Frock, MD;  Location: [facility];  Service: Urology;  Laterality: N/A;  . TOTAL KNEE ARTHROPLASTY Right 08/01/2016  . TOTAL KNEE ARTHROPLASTY Right 08/01/2016   Procedure: TOTAL KNEE ARTHROPLASTY;  Surgeon: Renette Butters, MD;  Location: [place];  Service: Orthopedics;  Laterality: Right;  . UPPER LEG SOFT TISSUE BIOPSY Left 2012   lthigh   Social History   Social History  . Marital status: Divorced    Spouse name: N/A  . Number of children: 1  . Years of education: College   Occupational History  . 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 disability    Social History Main Topics  . Smoking status: Former Smoker    Packs/day: 1.50    Years: 23.00    Types: Cigars, Cigarettes    Quit date: 05/22/2010  . Smokeless tobacco: Never Used  . Alcohol use No  . Drug use: No  . Sexual activity: Not Currently    Birth control/ protection: Condom     Comment: pt. declined condoms   Other Topics Concern  . None   Social History Narrative   Sister April stays with him currently although he hopes to return to truck driving. 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 Sister is starting medical school next year.   Patient is divorced and lives with his sister.   Patient has one adult son.   Patient is part-time, driving a truck.   Patient has a college education.   Patient is right-handed.   Patient drinks 2-3 cups of coffee and a 4-5 cups of tea.   Family History  Problem  Relation Age of Onset  . Stroke Father   . Diabetes Father   . Cancer Father     brain cancer  . Asthma Father   . Hypertension Sister   . Cancer Maternal Uncle     prostate cancer  . Asthma Sister    Allergies  Allergen Reactions  . Chocolate Shortness Of Breath and Other (See Comments)    Asthma attack, welts  . Cocoa Shortness Of Breath and Other (See Comments)    Other reaction(s): Other (See Comments) Asthma attack, welts  . Morphine And Related Shortness Of Breath, Itching and Other (See Comments)     chest pain  . Penicillins Shortness Of Breath and Rash    Has patient had a PCN reaction causing immediate rash, facial/tongue/throat swelling, SOB or lightheadedness with hypotension: Yes Has patient had a PCN reaction causing severe rash involving mucus membranes or skin necrosis:  No Has patient had a PCN reaction that required hospitalization No Has patient had a PCN reaction occurring within the last 10 years: Yes If all of the above answers are "NO", then may proceed with Cephalosporin use.  . Sulfonamide Derivatives Shortness Of Breath and Rash  . Tomato Shortness Of Breath  . Vancomycin Shortness Of Breath  . Flagyl [Metronidazole] Itching  . Sulfa Antibiotics   . Tramadol Itching and Nausea And Vomiting   Prior to Admission medications   Medication Sig Start Date End Date Taking? Authorizing Provider  albuterol (PROVENTIL HFA;VENTOLIN HFA) 108 (90 Base) MCG/ACT inhaler Inhale 2 puffs into the lungs every 4 (four) hours as needed for wheezing or shortness of breath. 05/22/16  Yes Campbell Riches, MD  albuterol (PROVENTIL) (2.5 MG/3ML) 0.083% nebulizer solution Take 3 mLs (2.5 mg total) by nebulization every 6 (six) hours as needed for wheezing or shortness of breath. 05/22/16  Yes Campbell Riches, MD  ALPRAZolam Duanne Moron) 0.5 MG tablet Take 1 tablet (0.5 mg total) by mouth at bedtime as needed for anxiety. Patient taking differently: Take 0.5 mg by mouth at bedtime.   09/04/16  Yes Carmen Dohmeier, MD  amLODipine (NORVASC) 5 MG tablet Take 5 mg by mouth at bedtime.  04/20/16  Yes Historical Provider, MD  aspirin EC 81 MG tablet Take 81 mg by mouth See admin instructions. Monday, Wednesday, & Saturday At bedtime   Yes Historical Provider, MD  Beclomethasone Dipropionate 80 MCG/ACT AERS Place 2 sprays into the nose once. Patient taking differently: Place 1 spray into the nose 2 (two) times daily.  05/22/16 07/18/17 Yes Campbell Riches, MD  Darunavir Ethanolate (PREZISTA) 800 MG tablet Take 1 tablet (800 mg total) by mouth daily. Patient taking differently: Take 800 mg by mouth at bedtime.  04/24/16  Yes Campbell Riches, MD  diclofenac sodium (VOLTAREN) 1 % GEL APPLY TO THE AFFECTED AREA TWICE DAILY AS NEEDED Patient taking differently: Apply 1 application topically 2 (two) times daily as needed (pain).  02/05/15  Yes Campbell Riches, MD  furosemide (LASIX) 40 MG tablet Take 0.5 tablets (20 mg total) by mouth daily. Patient taking differently: Take 20 mg by mouth at bedtime.  02/22/16  Yes Carly Montey Hora, MD  gabapentin (NEURONTIN) 300 MG capsule Take 1 capsule (300 mg total) by mouth 4 (four) times daily as needed (nerve pain). Patient taking differently: Take 300 mg by mouth 3 (three) times daily as needed (nerve pain).  09/07/15  Yes Dennie Bible, NP  hydrochlorothiazide (HYDRODIURIL) 25 MG tablet Take 1 tablet (25 mg total) by mouth daily. Patient taking differently: Take 25 mg by mouth at bedtime.  10/14/15  Yes Konrad Felix, PA  hydrOXYzine (ATARAX/VISTARIL) 25 MG tablet Take 1 tablet (25 mg total) by mouth Nightly. 09/04/16  Yes Carmen Dohmeier, MD  ipratropium (ATROVENT) 0.02 % nebulizer solution USE 2.5 ML(0.5 MG) VIA NEBULIZER THREE TIMES DAILY AS NEEDED FOR WHEEZING OR SHORTNESS OF BREATH 01/11/16  Yes Carly J Rivet, MD  ISENTRESS 400 MG tablet Take 800 mg by mouth at bedtime. 09/10/16  Yes Historical Provider, MD  mometasone-formoterol (DULERA) 200-5  MCG/ACT AERO Take 2 puffs first thing in am and then another 2 puffs about 12 hours later. Patient taking differently: Inhale 1 puff into the lungs every 12 (twelve) hours.  07/22/14  Yes Tanda Rockers, MD  montelukast (SINGULAIR) 10 MG tablet Take 1 tablet (10 mg total) by mouth at bedtime. 08/28/16  Yes Dellis Filbert  Chevis Pretty, MD  Multiple Vitamin (MULTIVITAMIN WITH MINERALS) TABS tablet Take 1 tablet by mouth at bedtime.   Yes Historical Provider, MD  mupirocin ointment (BACTROBAN) 2 % APPLY TO THE AFFECTED AREA TWICE DAILY FOR 2 WEEKS, PLACE COTTON BETWEEN TOES TO ALLOW BETTER AERATION AS NEEDED 08/22/16  Yes Campbell Riches, MD  NORVIR 100 MG TABS tablet Take 100 mg by mouth at bedtime. 08/18/16  Yes Historical Provider, MD  omeprazole (PRILOSEC) 40 MG capsule Take 40 mg by mouth at bedtime.    Yes Historical Provider, MD  ondansetron (ZOFRAN) 4 MG tablet Take 1 tablet (4 mg total) by mouth every 8 (eight) hours as needed for nausea or vomiting. 08/01/16  Yes Charna Elizabeth Martensen III, PA-C  oxyCODONE-acetaminophen (ROXICET) 5-325 MG tablet Take 1-2 tablets by mouth every 4 (four) hours as needed for severe pain. 08/01/16  Yes Charna Elizabeth Martensen III, PA-C  Polyethyl Glycol-Propyl Glycol (SYSTANE) 0.4-0.3 % GEL ophthalmic gel Place 1 application into both eyes daily.   Yes Historical Provider, MD  SUMAtriptan (IMITREX) 100 MG tablet Take 1 tablet (100 mg total) by mouth every 2 (two) hours as needed for migraine. May repeat in 2 hours if headache persists or recurs. 08/07/16  Yes Davonna Belling, MD  tizanidine (ZANAFLEX) 6 MG capsule Take 1 capsule (6 mg total) by mouth 3 (three) times daily as needed for muscle spasms. 09/07/15  Yes Dennie Bible, NP  TRUVADA 200-300 MG tablet Take 1 tablet by mouth at bedtime. 09/10/16  Yes Historical Provider, MD  zolpidem (AMBIEN CR) 12.5 MG CR tablet Take 12.5 mg by mouth at bedtime.   Yes Historical Provider, MD  ciprofloxacin (CIPRO) 500 MG tablet Take 1  tablet (500 mg total) by mouth 2 (two) times daily. Patient not taking: Reported on 09/25/2016 08/27/16   Sherlene Shams, MD  omeprazole (PRILOSEC) 20 MG capsule TAKE 1 CAPSULE(20 MG) BY MOUTH AT BEDTIME Patient not taking: Reported on 09/25/2016 08/15/16   Campbell Riches, MD  ondansetron (ZOFRAN) 4 MG tablet TAKE 1 TABLET BY MOUTH EVERY 8 HOURS AS NEEDED FOR NAUSEA AND VOMITING Patient not taking: Reported on 09/25/2016 04/24/16   Campbell Riches, MD   Dg Chest 2 View  Result Date: 09/24/2016 CLINICAL DATA:  Acute onset of generalized chest pain, fever, cough and congestion. Initial encounter. EXAM: CHEST  2 VIEW COMPARISON:  Chest radiograph performed 10/07/2015 FINDINGS: The lungs are well-aerated and clear. There is no evidence of focal opacification, pleural effusion or pneumothorax. The heart is normal in size; the mediastinal contour is within normal limits. No acute osseous abnormalities are seen. IMPRESSION: No acute cardiopulmonary process seen. Electronically Signed   By: Garald Balding M.D.   On: 09/24/2016 22:31   Dg Abd 1 View  Result Date: 09/25/2016 CLINICAL DATA:  Nausea/vomiting EXAM: ABDOMEN - 1 VIEW COMPARISON:  CT abdomen/ pelvis dated 09/25/2016 FINDINGS: Nonobstructive bowel gas pattern. Cholecystectomy clips. Expiratory contrast in the bladder. IMPRESSION: Unremarkable abdominal radiograph. Electronically Signed   By: Julian Hy M.D.   On: 09/25/2016 10:43   Ct Angio Chest Pe W And/or Wo Contrast  Result Date: 09/25/2016 CLINICAL DATA:  Acute onset of shortness of breath and generalized abdominal pain. Initial encounter. EXAM: CT ANGIOGRAPHY CHEST CT ABDOMEN AND PELVIS WITH CONTRAST TECHNIQUE: Multidetector CT imaging of the chest was performed using the standard protocol during bolus administration of intravenous contrast. This was repeated with a second dose of contrast, due to limited opacification of the  pulmonary arteries. Multiplanar CT image reconstructions and  MIPs were obtained to evaluate the vascular anatomy. Multidetector CT imaging of the abdomen and pelvis was performed using the standard protocol during bolus administration of intravenous contrast. CONTRAST:  180 mL of Isovue 370 IV contrast COMPARISON:  CTA of the chest performed 11/11/2014, and CT of the abdomen and pelvis performed 02/02/2014 FINDINGS: CTA CHEST FINDINGS Cardiovascular: Evaluation of the pulmonary arteries is markedly suboptimal due to limitations in the timing of the contrast bolus. Vague low-attenuation defect adjacent to the left main pulmonary artery is thought to reflect an underlying lymph node, as it does not appear to be in the vessel on coronal images. The heart is normal in size. The thoracic aorta is grossly unremarkable. The great vessels are within normal limits. Mediastinum/Nodes: The mediastinum is unremarkable in appearance. No mediastinal lymphadenopathy is seen. No pericardial effusion is identified. The thyroid gland is unremarkable. No axillary lymphadenopathy is seen. Lungs/Pleura: Mild nodular opacities at the posterior aspect of the left upper lobe are concerning for mild pneumonia. No pleural effusion or pneumothorax is seen. No masses are identified. Musculoskeletal: No acute osseous abnormalities are identified. The visualized musculature is unremarkable in appearance. Review of the MIP images confirms the above findings. CT ABDOMEN and PELVIS FINDINGS Hepatobiliary: The liver is unremarkable in appearance. The patient is status post cholecystectomy, with clips noted at the gallbladder fossa. The common bile duct remains normal in caliber. Pancreas: The pancreas is within normal limits. Spleen: The spleen is unremarkable in appearance. Adrenals/Urinary Tract: A 1.8 cm right adrenal nodule is noted. The left adrenal gland is grossly unremarkable. Scattered bilateral renal cysts are seen. The kidneys are otherwise grossly unremarkable. There is no evidence of  hydronephrosis. No renal or ureteral stones are identified. No perinephric stranding is seen. Stomach/Bowel: The stomach is unremarkable in appearance. The small bowel is within normal limits. The appendix is not visualized; there is no evidence for appendicitis. Mild wall thickening is noted along the distal sigmoid colon, raising question for a mild infectious or inflammatory process. The colon is otherwise unremarkable in appearance. Vascular/Lymphatic: The abdominal aorta is unremarkable in appearance. The inferior vena cava is grossly unremarkable. No retroperitoneal lymphadenopathy is seen. No pelvic sidewall lymphadenopathy is identified. Reproductive: The bladder is mildly distended and grossly unremarkable. The patient is status post prostatectomy. Other: A tiny periumbilical hernia is seen, containing only fat. Musculoskeletal: No acute osseous abnormalities are identified. The visualized musculature is unremarkable in appearance. Review of the MIP images confirms the above findings. IMPRESSION: 1. Markedly suboptimal evaluation of the pulmonary arteries. No central pulmonary embolus characterized. 2. Mild nodular opacities at the posterior aspect of the left upper lung lobe are concerning for mild pneumonia. 3. Mild wall thickening along the distal sigmoid colon, raising question for mild infectious or inflammatory process. 4. Stable 1.8 cm right adrenal nodule likely reflects an adrenal adenoma. 5. Tiny periumbilical hernia, containing only fat. Electronically Signed   By: Garald Balding M.D.   On: 09/25/2016 02:36   Ct Abdomen Pelvis W Contrast  Result Date: 09/25/2016 CLINICAL DATA:  Acute onset of shortness of breath and generalized abdominal pain. Initial encounter. EXAM: CT ANGIOGRAPHY CHEST CT ABDOMEN AND PELVIS WITH CONTRAST TECHNIQUE: Multidetector CT imaging of the chest was performed using the standard protocol during bolus administration of intravenous contrast. This was repeated with a  second dose of contrast, due to limited opacification of the pulmonary arteries. Multiplanar CT image reconstructions and MIPs were obtained to evaluate  the vascular anatomy. Multidetector CT imaging of the abdomen and pelvis was performed using the standard protocol during bolus administration of intravenous contrast. CONTRAST:  180 mL of Isovue 370 IV contrast COMPARISON:  CTA of the chest performed 11/11/2014, and CT of the abdomen and pelvis performed 02/02/2014 FINDINGS: CTA CHEST FINDINGS Cardiovascular: Evaluation of the pulmonary arteries is markedly suboptimal due to limitations in the timing of the contrast bolus. Vague low-attenuation defect adjacent to the left main pulmonary artery is thought to reflect an underlying lymph node, as it does not appear to be in the vessel on coronal images. The heart is normal in size. The thoracic aorta is grossly unremarkable. The great vessels are within normal limits. Mediastinum/Nodes: The mediastinum is unremarkable in appearance. No mediastinal lymphadenopathy is seen. No pericardial effusion is identified. The thyroid gland is unremarkable. No axillary lymphadenopathy is seen. Lungs/Pleura: Mild nodular opacities at the posterior aspect of the left upper lobe are concerning for mild pneumonia. No pleural effusion or pneumothorax is seen. No masses are identified. Musculoskeletal: No acute osseous abnormalities are identified. The visualized musculature is unremarkable in appearance. Review of the MIP images confirms the above findings. CT ABDOMEN and PELVIS FINDINGS Hepatobiliary: The liver is unremarkable in appearance. The patient is status post cholecystectomy, with clips noted at the gallbladder fossa. The common bile duct remains normal in caliber. Pancreas: The pancreas is within normal limits. Spleen: The spleen is unremarkable in appearance. Adrenals/Urinary Tract: A 1.8 cm right adrenal nodule is noted. The left adrenal gland is grossly unremarkable.  Scattered bilateral renal cysts are seen. The kidneys are otherwise grossly unremarkable. There is no evidence of hydronephrosis. No renal or ureteral stones are identified. No perinephric stranding is seen. Stomach/Bowel: The stomach is unremarkable in appearance. The small bowel is within normal limits. The appendix is not visualized; there is no evidence for appendicitis. Mild wall thickening is noted along the distal sigmoid colon, raising question for a mild infectious or inflammatory process. The colon is otherwise unremarkable in appearance. Vascular/Lymphatic: The abdominal aorta is unremarkable in appearance. The inferior vena cava is grossly unremarkable. No retroperitoneal lymphadenopathy is seen. No pelvic sidewall lymphadenopathy is identified. Reproductive: The bladder is mildly distended and grossly unremarkable. The patient is status post prostatectomy. Other: A tiny periumbilical hernia is seen, containing only fat. Musculoskeletal: No acute osseous abnormalities are identified. The visualized musculature is unremarkable in appearance. Review of the MIP images confirms the above findings. IMPRESSION: 1. Markedly suboptimal evaluation of the pulmonary arteries. No central pulmonary embolus characterized. 2. Mild nodular opacities at the posterior aspect of the left upper lung lobe are concerning for mild pneumonia. 3. Mild wall thickening along the distal sigmoid colon, raising question for mild infectious or inflammatory process. 4. Stable 1.8 cm right adrenal nodule likely reflects an adrenal adenoma. 5. Tiny periumbilical hernia, containing only fat. Electronically Signed   By: Garald Balding M.D.   On: 09/25/2016 02:36    Positive ROS: All other systems have been reviewed and were otherwise negative with the exception of those mentioned in the HPI and as above.  Objective: Labs cbc  Recent Labs  09/24/16 2136 09/25/16 0754  WBC 6.8 5.2  HGB 12.9* 11.9*  HCT 38.4* 35.6*  PLT 197 178     Labs inflam  Recent Labs  09/24/16 2136  CRP 1.2*    Labs coag No results for input(s): INR, PTT in the last 72 hours.  Invalid input(s): PT   Recent Labs  09/24/16  2136 09/25/16 0754  NA 138 134*  K 3.7 3.4*  CL 102 102  CO2 25 24  GLUCOSE 92 104*  BUN 10 9  CREATININE 1.36* 1.16  CALCIUM 9.5 8.7*    Physical Exam: Vitals:   09/25/16 2157 09/26/16 0524  BP: 124/87 130/86  Pulse: 92 93  Resp: 18 20  Temp: 99.8 F (37.7 C) 98.5 F (36.9 C)   General: Alert, no acute distress.  Sister at bedside Mental status: Alert and Oriented x3 Neurologic: Speech Clear and organized, no gross focal findings or movement disorder appreciated. Respiratory: No cyanosis, no use of accessory musculature.  Mild expiratory wheeze improved with cough. Cardiovascular: No pedal edema GI: Abdomen is protuberant, soft  Skin: Warm and dry.  No lesions in the area of chief complaint  Extremities: Warm and well perfused w/o edema Psychiatric: Patient is competent for consent with normal mood and affect  MUSCULOSKELETAL:  Right knee without erythema or significant effusion. Nontender to palpation. Range of motion 0-95 actively without range of motion. Ligaments stable neurovascularly intact. Distal sensation intact. Negative Homans.  Other extremities are atraumatic with painless ROM and NVI.   Prudencio Burly III PA-C 09/26/2016 7:19 AM

## 2016-09-26 NOTE — Discharge Summary (Signed)
Name: Casey Brennan MRN: [id number] DOB: [birth year] 56 y.o. PCP: Jule Ser, DO  Date of Admission: 09/24/2016 11:17 PM Date of Discharge: 09/26/2016 Attending Physician: Aldine Contes, MD  Discharge Diagnosis:  Principal Problem:   CAP (community acquired pneumonia) Active Problems:   HTN (hypertension)   Asthma   COPD GOLD III   Constipation   Discharge Medications: Allergies as of 09/26/2016      Reactions   Chocolate Shortness Of Breath, Other (See Comments)   Asthma attack, welts   Cocoa Shortness Of Breath, Other (See Comments)   Other reaction(s): Other (See Comments) Asthma attack, welts   Morphine And Related Shortness Of Breath, Itching, Other (See Comments)    chest pain   Penicillins Shortness Of Breath, Rash   Has patient had a PCN reaction causing immediate rash, facial/tongue/throat swelling, SOB or lightheadedness with hypotension: Yes Has patient had a PCN reaction causing severe rash involving mucus membranes or skin necrosis: No Has patient had a PCN reaction that required hospitalization No Has patient had a PCN reaction occurring within the last 10 years: Yes If all of the above answers are "NO", then may proceed with Cephalosporin use.   Sulfonamide Derivatives Shortness Of Breath, Rash   Tomato Shortness Of Breath   Vancomycin Shortness Of Breath   Flagyl [metronidazole] Itching   Sulfa Antibiotics    Tramadol Itching, Nausea And Vomiting      Medication List    STOP taking these medications   ciprofloxacin 500 MG tablet Commonly known as:  CIPRO   ondansetron 4 MG tablet Commonly known as:  ZOFRAN     TAKE these medications   albuterol 108 (90 Base) MCG/ACT inhaler Commonly known as:  PROVENTIL HFA;VENTOLIN HFA Inhale 2 puffs into the lungs every 4 (four) hours as needed for wheezing or shortness of breath.   albuterol (2.5 MG/3ML) 0.083% nebulizer solution Commonly known as:  PROVENTIL Take 3 mLs (2.5 mg total) by  nebulization every 6 (six) hours as needed for wheezing or shortness of breath.   ALPRAZolam 0.5 MG tablet Commonly known as:  XANAX Take 1 tablet (0.5 mg total) by mouth at bedtime as needed for anxiety. What changed:  when to take this   amLODipine 5 MG tablet Commonly known as:  NORVASC Take 5 mg by mouth at bedtime.   aspirin EC 81 MG tablet Take 81 mg by mouth See admin instructions. Monday, Wednesday, & Saturday At bedtime   Beclomethasone Dipropionate 80 MCG/ACT Aers Place 2 sprays into the nose once. What changed:  how much to take  when to take this   chlorpheniramine-HYDROcodone 10-8 MG/5ML Suer Commonly known as:  TUSSIONEX Take 5 mLs by mouth every 12 (twelve) hours as needed for cough.   darunavir 800 MG tablet Commonly known as:  PREZISTA Take 1 tablet (800 mg total) by mouth daily. What changed:  when to take this   diclofenac sodium 1 % Gel Commonly known as:  VOLTAREN APPLY TO THE AFFECTED AREA TWICE DAILY AS NEEDED What changed:  how much to take  how to take this  when to take this  reasons to take this  additional instructions   furosemide 40 MG tablet Commonly known as:  LASIX Take 0.5 tablets (20 mg total) by mouth daily. What changed:  when to take this   gabapentin 300 MG capsule Commonly known as:  NEURONTIN Take 1 capsule (300 mg total) by mouth 4 (four) times daily as needed (nerve pain). What changed:  when to take this   hydrochlorothiazide 25 MG tablet Commonly known as:  HYDRODIURIL Take 1 tablet (25 mg total) by mouth daily. What changed:  when to take this   hydrOXYzine 25 MG tablet Commonly known as:  ATARAX/VISTARIL Take 1 tablet (25 mg total) by mouth Nightly.   ipratropium 0.02 % nebulizer solution Commonly known as:  ATROVENT USE 2.5 ML(0.5 MG) VIA NEBULIZER THREE TIMES DAILY AS NEEDED FOR WHEEZING OR SHORTNESS OF BREATH   ISENTRESS 400 MG tablet Generic drug:  raltegravir Take 800 mg by mouth at bedtime.     levofloxacin 750 MG tablet Commonly known as:  LEVAQUIN Take 1 tablet (750 mg total) by mouth daily. Start taking on:  09/27/2016   metoCLOPramide 10 MG tablet Commonly known as:  REGLAN Take 1 tablet (10 mg total) by mouth every 8 (eight) hours as needed for nausea or vomiting.   mometasone-formoterol 200-5 MCG/ACT Aero Commonly known as:  DULERA Take 2 puffs first thing in am and then another 2 puffs about 12 hours later. What changed:  how much to take  how to take this  when to take this  additional instructions   montelukast 10 MG tablet Commonly known as:  SINGULAIR Take 1 tablet (10 mg total) by mouth at bedtime.   multivitamin with minerals Tabs tablet Take 1 tablet by mouth at bedtime.   mupirocin ointment 2 % Commonly known as:  BACTROBAN APPLY TO THE AFFECTED AREA TWICE DAILY FOR 2 WEEKS, PLACE COTTON BETWEEN TOES TO ALLOW BETTER AERATION AS NEEDED   NORVIR 100 MG Tabs tablet Generic drug:  ritonavir Take 100 mg by mouth at bedtime.   omeprazole 40 MG capsule Commonly known as:  PRILOSEC Take 40 mg by mouth at bedtime. What changed:  Another medication with the same name was removed. Continue taking this medication, and follow the directions you see here.   oxyCODONE-acetaminophen 5-325 MG tablet Commonly known as:  ROXICET Take 1-2 tablets by mouth every 4 (four) hours as needed for severe pain.   polyethylene glycol packet Commonly known as:  MIRALAX / GLYCOLAX Take 17 g by mouth daily as needed for mild constipation or moderate constipation.   senna-docusate 8.6-50 MG tablet Commonly known as:  Senokot-S Take 2 tablets by mouth 2 (two) times daily.   SUMAtriptan 100 MG tablet Commonly known as:  IMITREX Take 1 tablet (100 mg total) by mouth every 2 (two) hours as needed for migraine. May repeat in 2 hours if headache persists or recurs.   SYSTANE 0.4-0.3 % Gel ophthalmic gel Generic drug:  Polyethyl Glycol-Propyl Glycol Place 1 application  into Brennan eyes daily.   tizanidine 6 MG capsule Commonly known as:  ZANAFLEX Take 1 capsule (6 mg total) by mouth 3 (three) times daily as needed for muscle spasms.   TRUVADA 200-300 MG tablet Generic drug:  emtricitabine-tenofovir Take 1 tablet by mouth at bedtime.   zolpidem 12.5 MG CR tablet Commonly known as:  AMBIEN CR Take 12.5 mg by mouth at bedtime.       Disposition and follow-up:   Mr.Dhanush A Fialkowski was discharged from [facility] in Stable condition.  At the hospital follow up visit please address:  1.  Pneumonia and COPD.  Please ask about dyspnea, cough, and fevers.  2.  Constipation.  CT scan showed possible mild colitis, treated with levaquin. Ask about distention, bowel movements, abdominal pain.  Adjust bowel regimen as needed.  3. Microscopic Hematuria.  Please repeat  UA.  4.  Labs / imaging needed at time of follow-up: urinalysis  5.  Pending labs/ test needing follow-up: none  Follow-up Appointments: Follow-up Information    [facility]. Schedule an appointment as soon as possible for a visit in 1 week(s).   Why:  they will call you to schedule appointment in 1-2 weeks Contact information: [address] [place] [facility] Course by problem list: Principal Problem:   CAP (community acquired pneumonia) Active Problems:   HTN (hypertension)   Asthma   COPD GOLD III   Constipation   1. Pneumonia Mr Tilly is a 56 year old man with history of COPD who presented with several days of worsening dyspnea, productive cough, fevers, and O2 sats in the low 90s..  In the ED, CTA was obtained to rule out PE and revealed possible mild LUL pneumonia.  He was given nebulized bronchodilators and started on levofloxacin for empiric pneumonia coverage.  His breathing and cough improved and he was afebrile, with normal O2 saturations on room air.  He was discharged home to  complete 5 days of levofloxacin for pneumonia coverage and continued on his PTA inhaler regimen for asthma/COPD.  After discharge, his respiratory virus panel resulted positive for metapneumovirus, suggesting he may have a viral pneumonia and/or URI.  2. Nausea, Vomiting, and Constipation Acute nausea, vomiting, and abdominal distention with chronic constipation.  May be related to his febrile illness and and chronic home opiate use.  CT abdomen obtained in the ED showed possible mild colitis.  He was started on levofloxacin for empiric coverage of pneumonia and colitis.  CT and subsequent KUB did not show evidence of ileus or obstruction.  He was started on bowel regimen and had a large, rock hard bowel movement with improvement in his abdominal distention.  Instructed to take 2x docusate-senna twice daily and stay well hydrated, as well as completing 5 day course of levofloxacin.  3. Right Knee Pain R TKA by Dr Raye Sorrow 08/01/2016.  His knee has been mildly painful and slightly swollen since.  Arthrocentesis attempted unsuccessfully in the ED.  Evaluated by orthopedics, no concern for septic arthritis.  Discharge Vitals:   BP 130/86 (BP Location: Left Arm)   Pulse 93   Temp 98.5 F (36.9 C) (Oral)   Resp 20   Ht 5\' 9"  (1.753 m)   Wt 236 lb 12.8 oz (107.4 kg)   SpO2 93%   BMI 34.97 kg/m   Pertinent Labs, Studies, and Procedures:   CBC Latest Ref Rng & Units 09/26/2016 09/25/2016 09/24/2016  WBC 4.0 - 10.5 K/uL 4.9 5.2 6.8  Hemoglobin 13.0 - 17.0 g/dL 12.4(L) 11.9(L) 12.9(L)  Hematocrit 39.0 - 52.0 % 36.9(L) 35.6(L) 38.4(L)  Platelets 150 - 400 K/uL 193 178 197   CMP Latest Ref Rng & Units 09/26/2016 09/25/2016 09/24/2016  Glucose 65 - 99 mg/dL 112(H) 104(H) 92  BUN 6 - 20 mg/dL 11 9 10   Creatinine 0.61 - 1.24 mg/dL 1.29(H) 1.16 1.36(H)  Sodium 135 - 145 mmol/L 135 134(L) 138  Potassium 3.5 - 5.1 mmol/L 4.3 3.4(L) 3.7  Chloride 101 - 111 mmol/L 102 102 102  CO2 22 - 32 mmol/L 23 24  25   Calcium 8.9 - 10.3 mg/dL 9.0 8.7(L) 9.5  Total Protein 6.5 - 8.1 g/dL - 6.7 8.1  Total Bilirubin 0.3 - 1.2 mg/dL - 0.7 0.3  Alkaline Phos 38 -  126 U/L - 106 133(H)  AST 15 - 41 U/L - 18 29  ALT 17 - 63 U/L - 28 35   RVP PCR Metapneumovirus detected  Chest Radiographs 09/24/2016 IMPRESSION: No acute cardiopulmonary process seen.  CT Chest, Abdomen, Pelvis 09/25/2016 IMPRESSION: 1. Markedly suboptimal evaluation of the pulmonary arteries. No central pulmonary embolus characterized. 2. Mild nodular opacities at the posterior aspect of the left upper lung lobe are concerning for mild pneumonia. 3. Mild wall thickening along the distal sigmoid colon, raising question for mild infectious or inflammatory process. 4. Stable 1.8 cm right adrenal nodule likely reflects an adrenal adenoma. 5. Tiny periumbilical hernia, containing only fat.   Discharge Instructions: Discharge Instructions    Diet - low sodium heart healthy    Complete by:  As directed    Increase activity slowly    Complete by:  As directed     You were admitted to the hospital for pneumonia.  We started you on antibiotics and you improved.  You are breathing well and haven't had any more fevers.  I have prescribed you an antibiotic to take once daily for 3 more days starting tomorrow to complete treatment for pneumonia.  For constipation, you can take 2 pills of senna-docusate twice daily.  This is important to keep your bowels moving while you are taking narcotics like the oxycodone, which causes constipation.  If you have worsening breathing, cough, or fevers, please call the clinic for an earlier appointment or come back to the ED if it is an emergency.  Signed: Minus Liberty, MD 09/26/2016, 11:13 AM   Pager: [phone]

## 2016-09-26 NOTE — Discharge Instructions (Addendum)
You were admitted to the hospital for pneumonia.  We started you on antibiotics and you improved.  You are breathing well and haven't had any more fevers.  I have prescribed you an antibiotic to take once daily for 3 more days starting tomorrow to complete treatment for pneumonia.  For constipation, you can take 2 pills of senna-docusate twice daily.  This is important to keep your bowels moving while you are taking narcotics like the oxycodone, which causes constipation.  If you have worsening breathing, cough, or fevers, please call the clinic for an earlier appointment or come back to the ED if it is an emergency.

## 2016-09-26 NOTE — Progress Notes (Signed)
   Subjective: Breathing improved today, headache better, had a large, hard bowel movement yesterday and abdomen is less bloated and distended.  Objective:  Vital signs in last 24 hours: Vitals:   09/25/16 0500 09/25/16 0553 09/25/16 2157 09/26/16 0524  BP: (!) 131/91 137/89 124/87 130/86  Pulse: 96 88 92 93  Resp:  18 18 20   Temp:  99 F (37.2 C) 99.8 F (37.7 C) 98.5 F (36.9 C)  TempSrc:   Oral Oral  SpO2: 98% 98% 97% 93%  Weight:   236 lb 12.8 oz (107.4 kg)   Height:   5\' 9"  (1.753 m)    Physical Exam  Constitutional: He is oriented to person, place, and time. He appears well-developed and well-nourished. No distress.  Cardiovascular: Normal rate and regular rhythm.   Pulmonary/Chest: Effort normal and breath sounds normal. No respiratory distress. He has no wheezes. He has no rales.  Abdominal:  Soft, nontender, moderately distended  Neurological: He is alert and oriented to person, place, and time.  Psychiatric: He has a normal mood and affect. His behavior is normal.   CBC Latest Ref Rng & Units 09/26/2016 09/25/2016 09/24/2016  WBC 4.0 - 10.5 K/uL 4.9 5.2 6.8  Hemoglobin 13.0 - 17.0 g/dL 12.4(L) 11.9(L) 12.9(L)  Hematocrit 39.0 - 52.0 % 36.9(L) 35.6(L) 38.4(L)  Platelets 150 - 400 K/uL 193 178 197   CMP Latest Ref Rng & Units 09/26/2016 09/25/2016 09/24/2016  Glucose 65 - 99 mg/dL 112(H) 104(H) 92  BUN 6 - 20 mg/dL 11 9 10   Creatinine 0.61 - 1.24 mg/dL 1.29(H) 1.16 1.36(H)  Sodium 135 - 145 mmol/L 135 134(L) 138  Potassium 3.5 - 5.1 mmol/L 4.3 3.4(L) 3.7  Chloride 101 - 111 mmol/L 102 102 102  CO2 22 - 32 mmol/L 23 24 25   Calcium 8.9 - 10.3 mg/dL 9.0 8.7(L) 9.5  Total Protein 6.5 - 8.1 g/dL - 6.7 8.1  Total Bilirubin 0.3 - 1.2 mg/dL - 0.7 0.3  Alkaline Phos 38 - 126 U/L - 106 133(H)  AST 15 - 41 U/L - 18 29  ALT 17 - 63 U/L - 28 35     Assessment/Plan:  Active Problems:   HTN (hypertension)   Asthma   COPD GOLD III   CAP (community acquired pneumonia)  56  y.o. male with history of COPD and recent R TKA who presents with several days of fevers, cough, and dyspnea, but also abdominal pain and distention, myalgias and right knee pain.  He has well-controlled HIV (CD4 560 and undetectable VL in 04/2016) and is influenza negative.  He is hemodynamically stable, breathing well on room air, but not tolerating oral intake well.  #Fever #Pneumonia Cough, fever, dyspnea, and mild LUL infiltrates likely represent pneumonia. -Continue levofloxacin -F/u RVP -F/u BCx  #Constipation #Nausea CT read with sigmoid wall thickening, possible mild colitis.  With distention and nausea, concern for possible evolving ileus.  On chronic opiates. -KUB -Zofran PRN -PO as tolerated -Levofloxacin for empiric coverage of pneumonia and colitis -Continue home doc/senna  #Asthma -DuoNebs Q6H PRN -Continue home Nasonex, Dulera, Singulair  #Right Knee Pain S/p TKA by Dr Fredonia Highland on 08/01/16.  PA Roxan Hockey, no acute pathology or concern for septic joint.  #HTN -Continue home HCTZ and amlodipine -Hold lasix  #Microscopic Hematuria -Repeat UA as o/p  Fluids: none Diet: regular DVT Prophylaxis: lovenox Code Status: full  Dispo: Anticipated discharge today.  Minus Liberty, MD 09/26/2016, 8:51 AM Pager: [phone]

## 2016-09-26 NOTE — Progress Notes (Signed)
qPhysical Therapy Treatment Patient Details Name: Casey Brennan MRN: [id number] DOB: 16-Nov-1960 Today's Date: 09/26/2016    History of Present Illness 56 y.o. male with history of COPD and recent R TKA (08/01/16) who presents with several days of fevers, cough, and dyspnea, but also abdominal pain and distention, myalgias and right knee pain.  He has well-controlled HIV (CD4 560 and undetectable VL in 04/2016) and is influenza negative.     PT Comments    Pt progressing well with mobility, he ambulated 280' without an assistive device, no loss of balance, SaO2 97% on RA, 2/4 dyspnea. Performed R knee exercises, AROM 3-118*. Pt ready to DC home from PT standpoint, he has oupt PT appointment tomorrow.   Follow Up Recommendations  Outpatient PT     Equipment Recommendations  None recommended by PT    Recommendations for Other Services       Precautions / Restrictions Precautions Precautions: None Restrictions Weight Bearing Restrictions: No    Mobility  Bed Mobility Overal bed mobility: Modified Independent             General bed mobility comments: with rail, HOB up 30*  Transfers Overall transfer level: Independent Equipment used: None Transfers: Sit to/from [employer] to Stand: Independent            Ambulation/Gait Ambulation/Gait assistance: Supervision Ambulation Distance (Feet): 280 Feet Assistive device: None Gait Pattern/deviations: Step-through pattern;Decreased stride length;Decreased stance time - right Gait velocity: WFL Gait velocity interpretation: at or above normal speed for age/gender General Gait Details: Steady gait. No LOB. Pt reports he's concerned R knee will buckle, but stated it hasn't buckled recently.    Stairs            Wheelchair Mobility    Modified Rankin (Stroke Patients Only)       Balance Overall balance assessment: Independent                                           Cognition Arousal/Alertness: Awake/alert Behavior During Therapy: WFL for tasks assessed/performed Overall Cognitive Status: Within Functional Limits for tasks assessed                                        Exercises Total Joint Exercises Heel Slides: Right;10 reps;Seated Straight Leg Raises: AROM;Right;10 reps;Supine Long Arc Quad: AROM;Right;5 reps;Seated (with manual resistance) Goniometric ROM: 3-115* AROM R knee    General Comments        Pertinent Vitals/Pain Pain Assessment: No/denies pain    Home Living                      Prior Function            PT Goals (current goals can now be found in the care plan section) Acute Rehab PT Goals 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 PT Goal Formulation: With patient/family Time For Goal Achievement: 10/09/16 Potential to Achieve Goals: Good Progress towards PT goals: Progressing toward goals    Frequency    Min 3X/week      PT Plan Current plan remains appropriate    Co-evaluation             End of Session   Activity Tolerance: Patient tolerated treatment well (N&V)  Patient left: with call bell/phone within reach;with family/visitor present;in chair Nurse Communication: Mobility status PT Visit Diagnosis: Pain;Difficulty in walking, not elsewhere classified (R26.2) Pain - Right/Left: Right Pain - part of body: Knee     Time: 0921-0944 PT Time Calculation (min) (ACUTE ONLY): 23 min  Charges:  $Gait Training: 8-22 mins $Therapeutic Exercise: 8-22 mins                    G Codes:  Mobility: Walking and Moving Around Current Status (W8599): At least 1 percent but less than 20 percent impaired, limited or restricted Mobility: Walking and Moving Around Goal Status [phone]): 0 percent impaired, limited or restricted       Philomena Doheny 09/26/2016, 9:58 AM [phone]

## 2016-09-26 NOTE — Progress Notes (Signed)
OT Cancellation Note  Patient Details Name: Casey Brennan MRN: [id number] DOB: 1960/11/14   Cancelled Treatment:    Reason Eval/Treat Not Completed: OT screened, no needs identified, will sign off  [facility], OT/L  [phone] 09/26/2016 09/26/2016, 2:10 PM

## 2016-09-27 DIAGNOSIS — K5900 Constipation, unspecified: Secondary | ICD-10-CM

## 2016-09-27 DIAGNOSIS — K5901 Slow transit constipation: Secondary | ICD-10-CM

## 2016-09-30 LAB — CULTURE, BLOOD (ROUTINE X 2)
Culture: NO GROWTH
Culture: NO GROWTH

## 2016-10-06 ENCOUNTER — Ambulatory Visit: Payer: Medicaid Other

## 2016-10-06 ENCOUNTER — Other Ambulatory Visit: Payer: Self-pay | Admitting: Nurse Practitioner

## 2016-10-09 ENCOUNTER — Ambulatory Visit: Payer: Medicaid Other

## 2016-10-10 ENCOUNTER — Telehealth: Payer: Self-pay | Admitting: Neurology

## 2016-10-10 DIAGNOSIS — B20 Human immunodeficiency virus [HIV] disease: Secondary | ICD-10-CM

## 2016-10-10 DIAGNOSIS — G63 Polyneuropathy in diseases classified elsewhere: Principal | ICD-10-CM

## 2016-10-10 DIAGNOSIS — R252 Cramp and spasm: Secondary | ICD-10-CM

## 2016-10-10 DIAGNOSIS — G44229 Chronic tension-type headache, not intractable: Secondary | ICD-10-CM

## 2016-10-10 DIAGNOSIS — G250 Essential tremor: Secondary | ICD-10-CM

## 2016-10-10 MED ORDER — ALPRAZOLAM 0.5 MG PO TABS: 0.5000 mg | ORAL_TABLET | Freq: Every evening | ORAL | 0 refills | Status: DC | PRN

## 2016-10-10 NOTE — Telephone Encounter (Signed)
Faxed in to requested [facility]

## 2016-10-10 NOTE — Telephone Encounter (Signed)
Pt's sister called request refill for xanax. She said pt has slept better taking ALPRAZolam (XANAX) 0.5 MG tablet 2 tabs instead of 1 tab. Please send to [facility]/[place]

## 2016-10-10 NOTE — Addendum Note (Signed)
Addended by: Larey Seat on: 10/10/2016 04:47 PM   Modules accepted: Orders

## 2016-10-10 NOTE — Telephone Encounter (Signed)
C D. Refilled

## 2016-10-12 ENCOUNTER — Other Ambulatory Visit: Payer: Self-pay | Admitting: Pharmacist

## 2016-10-12 ENCOUNTER — Other Ambulatory Visit: Payer: Self-pay | Admitting: Nurse Practitioner

## 2016-10-12 DIAGNOSIS — J449 Chronic obstructive pulmonary disease, unspecified: Secondary | ICD-10-CM

## 2016-10-12 MED ORDER — TIOTROPIUM BROMIDE MONOHYDRATE 18 MCG IN CAPS: 18.0000 ug | ORAL_CAPSULE | Freq: Every day | RESPIRATORY_TRACT | 2 refills | Status: DC

## 2016-10-12 NOTE — Addendum Note (Signed)
Addended by: Forde Dandy on: 10/12/2016 06:18 PM   Modules accepted: Orders

## 2016-10-12 NOTE — Progress Notes (Signed)
Cancelled prescription for tiotropium. Spoke to patient's daughter to discuss. Patient needs PCP follow up for further evaluation.

## 2016-10-23 ENCOUNTER — Telehealth: Payer: Self-pay | Admitting: *Deleted

## 2016-10-23 NOTE — Telephone Encounter (Signed)
Patient sent appointment request via mychart. RN forwarded the request to Dr [name], he asked me to schedule the patient in his first available slot.  RN scheduled patient for 6/7 3:00.  RN replied with Dr Algis Downs answer and appointment information via MyChart message. Landis Gandy, RN

## 2016-10-31 DIAGNOSIS — M25661 Stiffness of right knee, not elsewhere classified: Secondary | ICD-10-CM | POA: Diagnosis not present

## 2016-10-31 DIAGNOSIS — M25561 Pain in right knee: Secondary | ICD-10-CM | POA: Diagnosis not present

## 2016-10-31 DIAGNOSIS — R262 Difficulty in walking, not elsewhere classified: Secondary | ICD-10-CM | POA: Diagnosis not present

## 2016-10-31 DIAGNOSIS — R531 Weakness: Secondary | ICD-10-CM | POA: Diagnosis not present

## 2016-11-02 ENCOUNTER — Encounter: Payer: Self-pay | Admitting: Internal Medicine

## 2016-11-02 ENCOUNTER — Ambulatory Visit (HOSPITAL_COMMUNITY)
Admission: RE | Admit: 2016-11-02 | Discharge: 2016-11-02 | Disposition: A | Discharge status: Home / Self Care | Payer: 59 | Source: Ambulatory Visit | Attending: Internal Medicine | Admitting: Internal Medicine

## 2016-11-02 ENCOUNTER — Ambulatory Visit (INDEPENDENT_AMBULATORY_CARE_PROVIDER_SITE_OTHER): Payer: PRIVATE HEALTH INSURANCE | Admitting: Internal Medicine

## 2016-11-02 VITALS — BP 131/96 | HR 85 | Temp 98.6°F | Ht 69.0 in | Wt 239.0 lb

## 2016-11-02 DIAGNOSIS — J984 Other disorders of lung: Secondary | ICD-10-CM | POA: Diagnosis not present

## 2016-11-02 DIAGNOSIS — R0600 Dyspnea, unspecified: Secondary | ICD-10-CM | POA: Insufficient documentation

## 2016-11-02 DIAGNOSIS — B20 Human immunodeficiency virus [HIV] disease: Secondary | ICD-10-CM | POA: Diagnosis not present

## 2016-11-02 DIAGNOSIS — Z7951 Long term (current) use of inhaled steroids: Secondary | ICD-10-CM

## 2016-11-02 DIAGNOSIS — G4700 Insomnia, unspecified: Secondary | ICD-10-CM

## 2016-11-02 DIAGNOSIS — Z87891 Personal history of nicotine dependence: Secondary | ICD-10-CM

## 2016-11-02 DIAGNOSIS — Z79899 Other long term (current) drug therapy: Secondary | ICD-10-CM | POA: Diagnosis not present

## 2016-11-02 DIAGNOSIS — N183 Chronic kidney disease, stage 3 (moderate): Secondary | ICD-10-CM

## 2016-11-02 DIAGNOSIS — J449 Chronic obstructive pulmonary disease, unspecified: Secondary | ICD-10-CM

## 2016-11-02 DIAGNOSIS — I10 Essential (primary) hypertension: Secondary | ICD-10-CM

## 2016-11-02 DIAGNOSIS — I129 Hypertensive chronic kidney disease with stage 1 through stage 4 chronic kidney disease, or unspecified chronic kidney disease: Secondary | ICD-10-CM | POA: Diagnosis not present

## 2016-11-02 DIAGNOSIS — N29 Other disorders of kidney and ureter in diseases classified elsewhere: Secondary | ICD-10-CM | POA: Diagnosis not present

## 2016-11-02 LAB — D-DIMER, QUANTITATIVE: D-Dimer, Quant: 1.98 ug/mL-FEU — ABNORMAL HIGH (ref 0.00–0.50)

## 2016-11-02 MED ORDER — ZOLPIDEM TARTRATE ER 12.5 MG PO TBCR: 12.5000 mg | EXTENDED_RELEASE_TABLET | Freq: Every evening | ORAL | 0 refills | Status: DC | PRN

## 2016-11-02 MED ORDER — BECLOMETHASONE DIPROPIONATE 80 MCG/ACT NA AERS: 2.0000 | INHALATION_SPRAY | Freq: Once | NASAL | 2 refills | Status: DC

## 2016-11-02 MED ORDER — DICLOFENAC SODIUM 1 % TD GEL: 2.0000 g | Freq: Two times a day (BID) | TRANSDERMAL | 3 refills | Status: DC | PRN

## 2016-11-02 MED ORDER — TECHNETIUM TO 99M ALBUMIN AGGREGATED
4.5000 | Freq: Once | INTRAVENOUS | Status: AC | PRN
  Administered 2016-11-02: 4.5 via INTRAVENOUS

## 2016-11-02 MED ORDER — IPRATROPIUM BROMIDE 0.02 % IN SOLN: RESPIRATORY_TRACT | 1 refills | Status: DC

## 2016-11-02 MED ORDER — TORSEMIDE 20 MG PO TABS: 20.0000 mg | ORAL_TABLET | Freq: Every day | ORAL | 11 refills | Status: DC

## 2016-11-02 MED ORDER — LOSARTAN POTASSIUM 25 MG PO TABS: 25.0000 mg | ORAL_TABLET | Freq: Every day | ORAL | 11 refills | Status: DC

## 2016-11-02 MED ORDER — TECHNETIUM TC 99M DIETHYLENETRIAME-PENTAACETIC ACID: 32.0000 | Freq: Once | INTRAVENOUS | Status: DC | PRN

## 2016-11-02 NOTE — Progress Notes (Addendum)
CC: here to meet new PCP and with complaint of dyspnea  HPI:  [name] is a 56 y.o. man with a past medical history listed below here today for complaint of dyspnea.  He is accompanied by his sister who provides much of the history.  She and patient report increased dyspnea over the past 2 weeks associated with working with the physical therapist.  She reports his therapist suggested he be evaluated for pulmonary embolus earlier this week but did not suggest he present to the emergency department.  He saw his nephrologist this morning and had his diuretic management altered due to increased swelling.  They did not raise concerns of PE during that visit.  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  He denies both pleuritic and exertional chest pain, no hemoptysis, no SOB at rest, no significant worsening of LE edema.  He has been compliant with inhalers and nebulizers for his obstructive lung disease and does not believe that symptoms are temporally related to change in the season.  He reports an increase recently in dry cough.  Exercise stress test in January 2017 was normal.  He has some shoulder/back pain located at his rhomboid muscle on the left that is related to sleeping on that side.  For details of today's visit and the status of his chronic medical issues please refer to the assessment and plan.   Past Medical History:  Diagnosis Date  . Arthritis    r knee   . Asthma    very rare  . Axillary lymphadenopathy   . Bronchitis   . Chronic anemia    normocytic  . Chronic folliculitis   . Elevated PSA   . Environmental and seasonal allergies   . GERD (gastroesophageal reflux disease)   . Gross hematuria   . H/O pericarditis    2010--  myopercarditis--  resolved  . HCAP (healthcare-associated pneumonia) 11/19/2014  . Headache(784.0)    HX SEVERE FRONTAL HA'S  . History of concussion    2012  &  2013  RESIDUAL HA'S --  RESOLVED  . History of gastric  ulcer   . History of kidney stones   . History of MRSA infection 2010   infected boil  . HIV (human immunodeficiency virus infection) ([place]) 1988  . Hypertension   . Lytic bone lesion of hip    WORK-UP DONE BY ONCOLOGIST DR HA --  NOT MALIGNANT  . Pneumonia    hx of  . Post concussion syndrome    resolved  . Prostate cancer ([place]) 04/25/13   gleason 3+3=6, volume 30 gm  . Sinusitis, chronic 05/14/2015  . Ulcer    hx of gastric  . Wears glasses     Review of Systems:  Please see pertinent ROS reviewed in HPI and problem based charting.   Physical Exam:  Vitals:   11/02/16 1511  BP: (!) 131/96  Pulse: 85  Temp: 98.6 F (37 C)  TempSrc: Oral  SpO2: 98%  Weight: 239 lb (108.4 kg)  Height: 5\' 9"  (1.753 m)   Physical Exam  Constitutional: He is oriented to person, place, and time.  He is alert and oriented, non-ill appearing, not distressed, sitting upright, speaking in full sentences.  HENT:  Head: Normocephalic and atraumatic.  Mouth/Throat: Oropharynx is clear and moist.  Eyes: Conjunctivae and EOM are normal.  Neck: Normal range of motion.  Cardiovascular: Normal rate, regular rhythm and normal heart sounds.   No  murmur heard. Pulmonary/Chest: Effort normal and breath sounds normal. No respiratory distress. He has no wheezes. He has no rales.  Abdominal: Soft. He exhibits distension. There is no tenderness. There is no rebound and no guarding.  Musculoskeletal: He exhibits edema and tenderness.  He has bilateral 1+ lower extremity edema with no significant difference between Left and Right. He has some tenderness over his Left rhomboid muscle.  Neurological: He is alert and oriented to person, place, and time.  Skin: Skin is warm and dry.  Psychiatric:  His affect is reserved.    Assessment & Plan:   See Encounters Tab for problem based charting.  Patient seen with Dr. Beryle Beams.  Dyspnea Assessment: Patients sister is very concerned about PE and feels  that patient should have a CTA to rule this out.  We discussed the pre-test probability being very low that he has a PE and the risk associated with CTA such as radiation exposure and contrast given his CKD.  His Wells score is 0 and I do not suspect PE.  His vitals are all within normal ranges and he is comfortable appearing.  I think it is possible his dyspnea is more related to deconditioning, volume, and his chronic lung disease.  D-dimer was obtained and this was found to be elevated.  This was not unexpected given his chronic medical conditions of HIV, CKD, and underlying lung disease.  Plan: - given elevated D-dimer, will obtain V/Q scan.  Patient transported from clinic to radiology to have done - follow up V/Q scan for further plan.  Given his underlying lung disease, it is possible this result will be indeterminate.  Will plan to initiate treatment if V/Q found to be high probability - if V/Q scan rules out PE and dyspnea continues, can consider Echo  Addendum 9:38 AM 11/03/2016: - low probability V/Q scan - CXR >> mild bronchitic changes - spoke with pt and sister to discuss results - f/u in clinic if dyspnea continues to consider Echo, further eval  Insomnia Assessment: Patient requesting refill of Ambien that he takes as needed for sleep.  Plan: - Refill Ambien #30 tablets, no refills  HTN (hypertension) BP Readings from Last 3 Encounters:  11/02/16 (!) 131/96  09/26/16 (!) 155/97  09/04/16 (!) 140/94   Assessment: Patient BP under decent control today.  He follows with nephrology for management of his CKD and they have managed his anti-hypertensives lately.  His sister reports visit today and change in medications as below.  Plan: - discontinue amlodipine and Lasix - was started on Losartan 25mg  daily and Torsemide 20mg  daily by nephrology - continue HCTZ 25mg  daily  COPD GOLD III Assessment: His sister states this diagnosis is inaccurate when queried about prior  symptoms today.  He is on Dulera, Albuterol PRN, and Atrovent nebulizer PRN.  She is asking for a new nebulizer machine and refill on Atrovent nebs.  This may be contributing to his dyspnea as well.  Plan: - refilled Atrovent nebulizer - nebulizer DME order placed - continue Dulera and Albuterol PRN

## 2016-11-02 NOTE — Assessment & Plan Note (Signed)
BP Readings from Last 3 Encounters:  11/02/16 (!) 131/96  09/26/16 (!) 155/97  09/04/16 (!) 140/94   Assessment: Patient BP under decent control today.  He follows with nephrology for management of his CKD and they have managed his anti-hypertensives lately.  His sister reports visit today and change in medications as below.  Plan: - discontinue amlodipine and Lasix - was started on Losartan 25mg  daily and Torsemide 20mg  daily by nephrology - continue HCTZ 25mg  daily

## 2016-11-02 NOTE — Assessment & Plan Note (Signed)
Assessment: Patient requesting refill of Ambien that he takes as needed for sleep.  Plan: - Refill Ambien #30 tablets, no refills

## 2016-11-02 NOTE — Assessment & Plan Note (Signed)
Assessment: His sister states this diagnosis is inaccurate when queried about prior symptoms today.  He is on Dulera, Albuterol PRN, and Atrovent nebulizer PRN.  She is asking for a new nebulizer machine and refill on Atrovent nebs.  This may be contributing to his dyspnea as well.  Plan: - refilled Atrovent nebulizer - nebulizer DME order placed - continue Dulera and Albuterol PRN

## 2016-11-02 NOTE — Assessment & Plan Note (Addendum)
Assessment: Patients sister is very concerned about PE and feels that patient should have a CTA to rule this out.  We discussed the pre-test probability being very low that he has a PE and the risk associated with CTA such as radiation exposure and contrast given his CKD.  His Wells score is 0 and I do not suspect PE.  His vitals are all within normal ranges and he is comfortable appearing.  I think it is possible his dyspnea is more related to deconditioning, volume, and his chronic lung disease.  D-dimer was obtained and this was found to be elevated.  This was not unexpected given his chronic medical conditions of HIV, CKD, and underlying lung disease.  Plan: - given elevated D-dimer, will obtain V/Q scan.  Patient transported from clinic to radiology to have done - follow up V/Q scan for further plan.  Given his underlying lung disease, it is possible this result will be indeterminate.  Will plan to initiate treatment if V/Q found to be high probability - if V/Q scan rules out PE and dyspnea continues, can consider Echo  Addendum 9:38 AM 11/03/2016: - low probability V/Q scan - CXR >> mild bronchitic changes - spoke with pt and sister to discuss results - f/u in clinic if dyspnea continues to consider Echo, further eval

## 2016-11-03 ENCOUNTER — Other Ambulatory Visit: Payer: Self-pay | Admitting: Infectious Diseases

## 2016-11-03 DIAGNOSIS — B20 Human immunodeficiency virus [HIV] disease: Secondary | ICD-10-CM

## 2016-11-03 NOTE — Progress Notes (Signed)
Medicine attending: I personally interviewed and examined this patient on the day of the patient visit and reviewed pertinent clinical ,laboratory, and radiographic data  with resident physician Dr. Jule Ser and we discussed a management plan. HIV patient with asthma, chronic stage 3 renal insufficiency - multifactorial - HIV nephropathy with sub-nephrotic proteinuria, HTN, renal stones. C/O dyspnea on exertion. Lungs clear. Not tachycardic. Normal respiratory rate. No calf tenderness. 1+ lower extrem edema. Normal O2 sat on room air. Well's score 0.  Pt daughter insists that he needs a CT angiogram to R/O PE. I discussed with her that  the radiation and contrast risks are too high to justify this with low pre-test probability.  We will check a d-dimer although I expect we may get a false positive elevation due to his HIV disease. If positive, then get a nuclear medicine lung scan.  D-dimer elevated. V/Q lung scan low probability for PE.  Murriel Hopper, MD, [facility]/Internal Medicine

## 2016-11-06 DIAGNOSIS — M25661 Stiffness of right knee, not elsewhere classified: Secondary | ICD-10-CM | POA: Diagnosis not present

## 2016-11-06 DIAGNOSIS — M25561 Pain in right knee: Secondary | ICD-10-CM | POA: Diagnosis not present

## 2016-11-06 DIAGNOSIS — R531 Weakness: Secondary | ICD-10-CM | POA: Diagnosis not present

## 2016-11-06 DIAGNOSIS — R262 Difficulty in walking, not elsewhere classified: Secondary | ICD-10-CM | POA: Diagnosis not present

## 2016-11-07 ENCOUNTER — Other Ambulatory Visit: Payer: Self-pay | Admitting: Infectious Diseases

## 2016-11-07 DIAGNOSIS — B20 Human immunodeficiency virus [HIV] disease: Secondary | ICD-10-CM

## 2016-11-07 DIAGNOSIS — G43009 Migraine without aura, not intractable, without status migrainosus: Secondary | ICD-10-CM

## 2016-11-08 ENCOUNTER — Encounter: Payer: Self-pay | Admitting: Internal Medicine

## 2016-11-15 DIAGNOSIS — N182 Chronic kidney disease, stage 2 (mild): Secondary | ICD-10-CM | POA: Diagnosis not present

## 2016-11-20 DIAGNOSIS — I2510 Atherosclerotic heart disease of native coronary artery without angina pectoris: Secondary | ICD-10-CM | POA: Diagnosis not present

## 2016-11-20 DIAGNOSIS — I10 Essential (primary) hypertension: Secondary | ICD-10-CM | POA: Diagnosis not present

## 2016-11-20 DIAGNOSIS — E6609 Other obesity due to excess calories: Secondary | ICD-10-CM | POA: Diagnosis not present

## 2016-11-20 DIAGNOSIS — N182 Chronic kidney disease, stage 2 (mild): Secondary | ICD-10-CM | POA: Diagnosis not present

## 2016-11-28 ENCOUNTER — Other Ambulatory Visit: Payer: Self-pay | Admitting: Infectious Diseases

## 2016-11-28 ENCOUNTER — Other Ambulatory Visit: Payer: Self-pay | Admitting: Neurology

## 2016-11-29 ENCOUNTER — Telehealth: Payer: Self-pay | Admitting: Neurology

## 2016-11-29 DIAGNOSIS — G63 Polyneuropathy in diseases classified elsewhere: Principal | ICD-10-CM

## 2016-11-29 DIAGNOSIS — R252 Cramp and spasm: Secondary | ICD-10-CM

## 2016-11-29 DIAGNOSIS — G44229 Chronic tension-type headache, not intractable: Secondary | ICD-10-CM

## 2016-11-29 DIAGNOSIS — B20 Human immunodeficiency virus [HIV] disease: Secondary | ICD-10-CM

## 2016-11-29 DIAGNOSIS — G250 Essential tremor: Secondary | ICD-10-CM

## 2016-11-29 NOTE — Telephone Encounter (Signed)
Per Dr. Edwena Felty last note, "Will give 0.5 mg Xanax for sleep 30# no refill.  Tension Headaches, no nausea associated. Will respond to Xanax."  I will send to Dr. Brett Fairy for review.

## 2016-11-29 NOTE — Telephone Encounter (Signed)
I will not continue to provide Xanax- this can be taken over by his PCP. If he truly takes this for headaches, he should need not need more than 15 a month.

## 2016-11-30 ENCOUNTER — Other Ambulatory Visit: Payer: Self-pay | Admitting: Internal Medicine

## 2016-11-30 ENCOUNTER — Other Ambulatory Visit: Payer: Self-pay | Admitting: Neurology

## 2016-11-30 DIAGNOSIS — G250 Essential tremor: Secondary | ICD-10-CM

## 2016-11-30 DIAGNOSIS — G44229 Chronic tension-type headache, not intractable: Secondary | ICD-10-CM

## 2016-11-30 DIAGNOSIS — G63 Polyneuropathy in diseases classified elsewhere: Principal | ICD-10-CM

## 2016-11-30 DIAGNOSIS — B20 Human immunodeficiency virus [HIV] disease: Secondary | ICD-10-CM

## 2016-11-30 DIAGNOSIS — R252 Cramp and spasm: Secondary | ICD-10-CM

## 2016-11-30 NOTE — Telephone Encounter (Signed)
Pt's sister called reg xanax. Msg relayed, she will call PCP

## 2016-11-30 NOTE — Telephone Encounter (Signed)
NEEDS MEDICATION FOR SLEEPING, [facility]

## 2016-12-01 ENCOUNTER — Other Ambulatory Visit: Payer: Self-pay | Admitting: Infectious Diseases

## 2016-12-01 DIAGNOSIS — B20 Human immunodeficiency virus [HIV] disease: Secondary | ICD-10-CM

## 2016-12-01 MED ORDER — ZOLPIDEM TARTRATE ER 12.5 MG PO TBCR: 12.5000 mg | EXTENDED_RELEASE_TABLET | Freq: Every evening | ORAL | 5 refills | Status: DC | PRN

## 2016-12-04 ENCOUNTER — Other Ambulatory Visit: Payer: Self-pay | Admitting: *Deleted

## 2016-12-04 DIAGNOSIS — B20 Human immunodeficiency virus [HIV] disease: Secondary | ICD-10-CM

## 2016-12-04 MED ORDER — DARUNAVIR ETHANOLATE 800 MG PO TABS: ORAL_TABLET | ORAL | 5 refills | Status: DC

## 2016-12-04 NOTE — Telephone Encounter (Signed)
Ambien rx called to [facility] pharmacy. 

## 2016-12-05 ENCOUNTER — Telehealth: Payer: Self-pay | Admitting: *Deleted

## 2016-12-05 NOTE — Telephone Encounter (Addendum)
Call to [facility] for [place] for Zolpidem ER not covered under plan. Zolpiem 5 and 10 mg tablets will not require approval.  Sander Nephew,

## 2016-12-07 ENCOUNTER — Ambulatory Visit: Payer: Medicaid Other | Admitting: Infectious Diseases

## 2016-12-15 ENCOUNTER — Encounter: Payer: Self-pay | Admitting: *Deleted

## 2016-12-21 ENCOUNTER — Other Ambulatory Visit: Payer: Self-pay | Admitting: Internal Medicine

## 2016-12-21 MED ORDER — ZOLPIDEM TARTRATE 5 MG PO TABS: 5.0000 mg | ORAL_TABLET | Freq: Every evening | ORAL | 2 refills | Status: DC | PRN

## 2016-12-21 NOTE — Telephone Encounter (Signed)
Yes, I have ordered new Rx.  Zolpidem 5-10mg  [facility] PRN.  Thanks!

## 2016-12-25 ENCOUNTER — Ambulatory Visit (INDEPENDENT_AMBULATORY_CARE_PROVIDER_SITE_OTHER): Payer: Medicare Other | Admitting: Internal Medicine

## 2016-12-25 ENCOUNTER — Other Ambulatory Visit: Payer: Self-pay | Admitting: Infectious Diseases

## 2016-12-25 ENCOUNTER — Encounter: Payer: Self-pay | Admitting: Internal Medicine

## 2016-12-25 ENCOUNTER — Other Ambulatory Visit: Payer: Self-pay | Admitting: Neurology

## 2016-12-25 VITALS — BP 122/92 | HR 78 | Ht 69.0 in | Wt 230.0 lb

## 2016-12-25 DIAGNOSIS — R933 Abnormal findings on diagnostic imaging of other parts of digestive tract: Secondary | ICD-10-CM

## 2016-12-25 DIAGNOSIS — I425 Other restrictive cardiomyopathy: Secondary | ICD-10-CM | POA: Diagnosis not present

## 2016-12-25 DIAGNOSIS — G250 Essential tremor: Secondary | ICD-10-CM

## 2016-12-25 DIAGNOSIS — R110 Nausea: Secondary | ICD-10-CM

## 2016-12-25 DIAGNOSIS — G63 Polyneuropathy in diseases classified elsewhere: Principal | ICD-10-CM

## 2016-12-25 DIAGNOSIS — R109 Unspecified abdominal pain: Secondary | ICD-10-CM | POA: Diagnosis not present

## 2016-12-25 DIAGNOSIS — R1310 Dysphagia, unspecified: Secondary | ICD-10-CM

## 2016-12-25 DIAGNOSIS — B20 Human immunodeficiency virus [HIV] disease: Secondary | ICD-10-CM

## 2016-12-25 DIAGNOSIS — K219 Gastro-esophageal reflux disease without esophagitis: Secondary | ICD-10-CM

## 2016-12-25 DIAGNOSIS — R252 Cramp and spasm: Secondary | ICD-10-CM

## 2016-12-25 DIAGNOSIS — I2510 Atherosclerotic heart disease of native coronary artery without angina pectoris: Secondary | ICD-10-CM | POA: Diagnosis not present

## 2016-12-25 DIAGNOSIS — R140 Abdominal distension (gaseous): Secondary | ICD-10-CM | POA: Diagnosis not present

## 2016-12-25 DIAGNOSIS — G44229 Chronic tension-type headache, not intractable: Secondary | ICD-10-CM

## 2016-12-25 MED ORDER — OMEPRAZOLE 40 MG PO CPDR: 40.0000 mg | DELAYED_RELEASE_CAPSULE | ORAL | 2 refills | Status: DC

## 2016-12-25 MED ORDER — NA SULFATE-K SULFATE-MG SULF 17.5-3.13-1.6 GM/177ML PO SOLN: ORAL | 0 refills | Status: DC

## 2016-12-25 NOTE — Patient Instructions (Addendum)
You have been scheduled for an endoscopy and colonoscopy. Please follow the written instructions given to you at your visit today. Please pick up your prep supplies at the pharmacy within the next 1-3 days. If you use inhalers (even only as needed), please bring them with you on the day of your procedure. Your physician has requested that you go to www.startemmi.com and enter the access code given to you at your visit today. This web site gives a general overview about your procedure. However, you should still follow specific instructions given to you by our office regarding your preparation for the procedure.  If you are age 54 or older, your body mass index should be between 23-30. Your Body mass index is 33.97 kg/m. If this is out of the aforementioned range listed, please consider follow up with your Primary Care Provider.  If you are age 82 or younger, your body mass index should be between 19-25. Your Body mass index is 33.97 kg/m. If this is out of the aformentioned range listed, please consider follow up with your Primary Care Provider.  You have been scheduled for an abdominal ultrasound at [facility] (1st floor of hospital) on Thursday, 12-28-16 at 9:30am. Please arrive 15 minutes prior to your appointment for registration. Make certain not to have anything to eat or drink 6 hours prior to your appointment. Should you need to reschedule your appointment, please contact radiology at [phone]. This test typically takes about 30 minutes to perform.  Please take your omeprazole in the am before breakfast daily.  Please purchase the following medications over the counter and take as directed: Benefiber Take 1-2 teaspoons daily

## 2016-12-25 NOTE — Progress Notes (Signed)
Patient ID: Casey Brennan, male   DOB: [birth year], 56 y.o.   MRN: [id number] HPI: Casey Brennan is a 56 year old male with a past medical history of GERD, peptic ulcer disease, HIV, prostate cancer status post prostatectomy, chronic kidney disease, who is seen in consultation at the request of Jule Ser, DO to evaluate abdominal distention and abnormal CT scan of the colon. He is here today with his sister.  The patient reports that over the last several months he has been having issues with abdominal swelling and distention. This is associated with an aching abdominal pain. He was hospitalized with pneumonia in March. During this time he had a CT scan was suggested some left colonic wall thickening raising the question of colitis. Of note he was also constipated during this hospitalization and had some improvement with abdominal distention after relief of constipation. He reports that bowel movements recently have been soft and light brown in color. These are occurring every other day. He reports these are easy to pass. He does occasionally feel constipated and will use Dulcolax. His abdominal distention is rather constant and doesn't always change with eating. He has gained 30-40 pounds and has had associated lower extremity swelling. He has been working with his cardiologist and nephrologist with this increased volume. His Lasix was recently changed to torsemide and his amlodipine was changed to losartan. He has continued HCTZ. He has an echocardiogram ordered for later this evening. He has lost 10 pounds since changing his diuretic therapy. He has some exertional dyspnea but denies chest pain and shortness of breath at rest. Lower extremity swelling has improved but not resolved.  He does report occasional nausea without vomiting. He's having some heartburn despite omeprazole. He uses omeprazole 40 mg at bedtime. Occasionally he will have issues swallowing solid food. Food feels like it stops in his  upper chest. Occasionally he will bring this food back up.  He has had prior endoscopic procedures with last procedures being in January 2016. Dr. Michail Sermon performed these studies. EGD 07/29/2014 minimal antral gastritis, small hiatal hernia. Otherwise normal. Colonoscopy fair prep. Medium-sized internal hemorrhoids, otherwise normal.  No known family history of IBD or colon cancer.  I received a letter from Dr. Fredonia Highland regarding prophylactic antibiotics prior to procedure.   Past Medical History:  Diagnosis Date  . Arthritis    r knee   . Asthma    very rare  . Axillary lymphadenopathy   . Bronchitis   . Chronic anemia    normocytic  . Chronic folliculitis   . Elevated PSA   . Environmental and seasonal allergies   . GERD (gastroesophageal reflux disease)   . Gross hematuria   . H/O pericarditis    2010--  myopercarditis--  resolved  . HCAP (healthcare-associated pneumonia) 11/19/2014  . Headache(784.0)    HX SEVERE FRONTAL HA'S  . Hiatal hernia   . History of concussion    2012  &  2013  RESIDUAL HA'S --  RESOLVED  . History of gastric ulcer   . History of kidney stones   . History of MRSA infection 2010   infected boil  . HIV (human immunodeficiency virus infection) ([place]) 1988  . Hypertension   . Internal hemorrhoids   . Lytic bone lesion of hip    WORK-UP DONE BY ONCOLOGIST DR HA --  NOT MALIGNANT  . Pneumonia    hx of  . Post concussion syndrome    resolved  . Prostate cancer ([place]) 04/25/13  gleason 3+3=6, volume 30 gm  . Sinusitis, chronic 05/14/2015  . Ulcer    hx of gastric  . Wears glasses     Past Surgical History:  Procedure Laterality Date  . CARDIAC CATHETERIZATION  03-23-2009  DR COOPER   NORMAL CORONARY ARTERIES  . CHOLECYSTECTOMY N/A 11/06/2014   Procedure: LAPAROSCOPIC CHOLECYSTECTOMY WITH INTRAOPERATIVE CHOLANGIOGRAM;  Surgeon: Judeth Horn, MD;  Location: [place];  Service: General;  Laterality: N/A;  . COLONOSCOPY  12/26/2011   Procedure:  COLONOSCOPY;  Surgeon: Lear Ng, MD;  Location: [facility];  Service: Endoscopy;  Laterality: N/A;  . COLONOSCOPY WITH PROPOFOL N/A 07/29/2014   Procedure: COLONOSCOPY WITH PROPOFOL;  Surgeon: Lear Ng, MD;  Location: [place];  Service: Endoscopy;  Laterality: N/A;  . CYSTOSCOPY/RETROGRADE/URETEROSCOPY Bilateral 04/25/2013   Procedure: CYSTOSCOPY/ BILATERAL RETROGRADES; BLADDER BIOPSIES;  Surgeon: Alexis Frock, MD;  Location: [facility];  Service: Urology;  Laterality: Bilateral;  . DENTAL EXAMINATION UNDER ANESTHESIA    . ESOPHAGOGASTRODUODENOSCOPY  12/26/2011   Procedure: ESOPHAGOGASTRODUODENOSCOPY (EGD);  Surgeon: Lear Ng, MD;  Location: [facility];  Service: Endoscopy;  Laterality: N/A;  . ESOPHAGOGASTRODUODENOSCOPY (EGD) WITH PROPOFOL N/A 07/29/2014   Procedure: ESOPHAGOGASTRODUODENOSCOPY (EGD) WITH PROPOFOL;  Surgeon: Lear Ng, MD;  Location: [place];  Service: Endoscopy;  Laterality: N/A;  . EXCISION CHRONIC LEFT BREAST ABSCESS  09-21-2010  . EXCISIONAL BX LEFT BREAST MASS/  I  &  D LEFT BREAST ABSCESS  03-24-2009  . KNEE ARTHROSCOPY Right 1985  . left axilla biopsy  04/2013  . left hip biopsy  10/2012  . LYMPHADENECTOMY Bilateral 08/18/2013   Procedure: LYMPHADENECTOMY "PELVIC LYMPH NODE DISSECTION";  Surgeon: Alexis Frock, MD;  Location: [facility];  Service: Urology;  Laterality: Bilateral;  . PROSTATE BIOPSY N/A 04/25/2013   Procedure: BIOPSY TRANSRECTAL ULTRASONIC PROSTATE (TUBP);  Surgeon: Alexis Frock, MD;  Location: [facility];  Service: Urology;  Laterality: N/A;  . ROBOT ASSISTED LAPAROSCOPIC RADICAL PROSTATECTOMY N/A 08/18/2013   Procedure: ROBOTIC ASSISTED LAPAROSCOPIC RADICAL PROSTATECTOMY;  Surgeon: Alexis Frock, MD;  Location: [facility];  Service: Urology;  Laterality: N/A;  . TOTAL KNEE ARTHROPLASTY Right 08/01/2016  . TOTAL KNEE ARTHROPLASTY Right 08/01/2016   Procedure: TOTAL KNEE  ARTHROPLASTY;  Surgeon: Renette Butters, MD;  Location: [place];  Service: Orthopedics;  Laterality: Right;  . UPPER LEG SOFT TISSUE BIOPSY Left 2012   lthigh    Outpatient Medications Prior to Visit  Medication Sig Dispense Refill  . albuterol (PROVENTIL HFA;VENTOLIN HFA) 108 (90 Base) MCG/ACT inhaler Inhale 2 puffs into the lungs every 4 (four) hours as needed for wheezing or shortness of breath. 2 Inhaler 3  . albuterol (PROVENTIL) (2.5 MG/3ML) 0.083% nebulizer solution Take 3 mLs (2.5 mg total) by nebulization every 6 (six) hours as needed for wheezing or shortness of breath. 75 mL 12  . ALPRAZolam (XANAX) 0.5 MG tablet TAKE 1 TABLET BY MOUTH AT BEDTIME AS NEEDED FOR ANXIETY 30 tablet 0  . aspirin EC 81 MG tablet Take 81 mg by mouth See admin instructions. Monday, Wednesday, & Saturday At bedtime    . darunavir (PREZISTA) 800 MG tablet TAKE 1 TABLET(800 MG) BY MOUTH DAILY 30 tablet 5  . diclofenac sodium (VOLTAREN) 1 % GEL Apply 2 g topically 2 (two) times daily as needed (pain). 1 Tube 3  . gabapentin (NEURONTIN) 300 MG capsule TAKE 1 CAPSULE(300 MG) BY MOUTH FOUR TIMES DAILY AS NEEDED FOR NERVE PAIN 120 capsule 11  . hydrochlorothiazide (  HYDRODIURIL) 25 MG tablet Take 1 tablet (25 mg total) by mouth daily. (Patient taking differently: Take 25 mg by mouth at bedtime. ) 30 tablet 0  . hydrOXYzine (ATARAX/VISTARIL) 25 MG tablet TAKE 1 TABLET(25 MG) BY MOUTH EVERY NIGHT 30 tablet 0  . ipratropium (ATROVENT) 0.02 % nebulizer solution USE 2.5 ML(0.5 MG) VIA NEBULIZER THREE TIMES DAILY AS NEEDED FOR WHEEZING OR SHORTNESS OF BREATH 1650 mL 1  . ISENTRESS 400 MG tablet Take 800 mg by mouth at bedtime.  5  . losartan (COZAAR) 25 MG tablet Take 1 tablet (25 mg total) by mouth daily. 30 tablet 11  . mometasone-formoterol (DULERA) 200-5 MCG/ACT AERO Take 2 puffs first thing in am and then another 2 puffs about 12 hours later. (Patient taking differently: Inhale 1 puff into the lungs every 12 (twelve)  hours. ) 1 Inhaler 11  . montelukast (SINGULAIR) 10 MG tablet Take 1 tablet (10 mg total) by mouth at bedtime. 30 tablet 5  . Multiple Vitamin (MULTIVITAMIN WITH MINERALS) TABS tablet Take 1 tablet by mouth at bedtime.    . NORVIR 100 MG TABS tablet Take 100 mg by mouth at bedtime.    . ondansetron (ZOFRAN) 4 MG tablet TAKE 1 TABLET BY MOUTH EVERY 8 HOURS AS NEEDED FOR NAUSEA 40 tablet 2  . Polyethyl Glycol-Propyl Glycol (SYSTANE) 0.4-0.3 % GEL ophthalmic gel Place 1 application into both eyes daily.    . SUMAtriptan (IMITREX) 100 MG tablet Take 1 tablet (100 mg total) by mouth every 2 (two) hours as needed for migraine. May repeat in 2 hours if headache persists or recurs. 10 tablet 0  . tizanidine (ZANAFLEX) 6 MG capsule TAKE ONE CAPSULE BY MOUTH THREE TIMES DAILY AS NEEDED FOR MUSCLE SPASMS 270 capsule 0  . torsemide (DEMADEX) 20 MG tablet Take 1 tablet (20 mg total) by mouth daily. 30 tablet 11  . TRUVADA 200-300 MG tablet Take 1 tablet by mouth at bedtime.  1  . zolpidem (AMBIEN) 5 MG tablet Take 1-2 tablets (5-10 mg total) by mouth at bedtime as needed for sleep. 30 tablet 2  . omeprazole (PRILOSEC) 40 MG capsule Take 40 mg by mouth at bedtime.     [name] oxyCODONE-acetaminophen (ROXICET) 5-325 MG tablet Take 1-2 tablets by mouth every 4 (four) hours as needed for severe pain. 60 tablet 0  . Beclomethasone Dipropionate 80 MCG/ACT AERS Place 2 sprays into the nose once. 1 Inhaler 2   No facility-administered medications prior to visit.     Allergies  Allergen Reactions  . Chocolate Shortness Of Breath and Other (See Comments)    Asthma attack, welts  . Cocoa Shortness Of Breath and Other (See Comments)    Other reaction(s): Other (See Comments) Asthma attack, welts  . Morphine And Related Shortness Of Breath, Itching and Other (See Comments)     chest pain  . Penicillins Shortness Of Breath and Rash    Has patient had a PCN reaction causing immediate rash, facial/tongue/throat swelling,  SOB or lightheadedness with hypotension: Yes Has patient had a PCN reaction causing severe rash involving mucus membranes or skin necrosis: No Has patient had a PCN reaction that required hospitalization No Has patient had a PCN reaction occurring within the last 10 years: Yes If all of the above answers are "NO", then may proceed with Cephalosporin use.  . Sulfonamide Derivatives Shortness Of Breath and Rash  . Tomato Shortness Of Breath  . Vancomycin Shortness Of Breath  . Flagyl [Metronidazole] Itching  .  Sulfa Antibiotics   . Tramadol Itching and Nausea And Vomiting    Family History  Problem Relation Age of Onset  . Stroke Father   . Diabetes Father   . Cancer Father        brain cancer  . Asthma Father   . Hypertension Sister   . Cancer Maternal Uncle        prostate cancer  . Asthma Sister     Social History  Substance Use Topics  . Smoking status: Former Smoker    Packs/day: 1.50    Years: 23.00    Types: Cigars, Cigarettes    Quit date: 05/22/2010  . Smokeless tobacco: Never Used  . Alcohol use No    ROS: As per history of present illness, otherwise negative  BP (!) 122/92   Pulse 78   Ht 5\' 9"  (1.753 m)   Wt 230 lb (104.3 kg)   BMI 33.97 kg/m  Constitutional: Well-developed and well-nourished. No distress. HEENT: Normocephalic and atraumatic.Conjunctivae are normal.  No scleral icterus. Neck: Neck supple. Trachea midline. Cardiovascular: Normal rate, regular rhythm and intact distal pulses. No M/R/G Pulmonary/chest: Effort normal and breath sounds normal. No wheezing, rales or rhonchi. Abdominal: Soft, Obese, nontender, mildly distended without appreciable fluid wave. Bowel sounds active throughout. There are no masses palpable.  Extremities: no clubbing, cyanosis, 1+ lower extremity edema to the midshin  Neurological: Alert and oriented to person place and time. Skin: Skin is warm and dry.  Psychiatric: Normal mood and affect. Behavior is  normal.  RELEVANT LABS AND IMAGING: CBC    Component Value Date/Time   WBC 4.9 09/26/2016 0849   RBC 4.06 (L) 09/26/2016 0849   HGB 12.4 (L) 09/26/2016 0849   HGB 14.0 05/11/2015 1108   HCT 36.9 (L) 09/26/2016 0849   HCT 42.1 05/11/2015 1108   PLT 193 09/26/2016 0849   PLT 126 (L) 05/11/2015 1108   MCV 90.9 09/26/2016 0849   MCV 97.5 05/11/2015 1108   MCH 30.5 09/26/2016 0849   MCHC 33.6 09/26/2016 0849   RDW 15.1 09/26/2016 0849   RDW 12.9 05/11/2015 1108   LYMPHSABS 2.4 08/07/2016 0302   LYMPHSABS 1.5 05/11/2015 1108   MONOABS 1.1 (H) 08/07/2016 0302   MONOABS 0.3 05/11/2015 1108   EOSABS 0.4 08/07/2016 0302   EOSABS 0.6 (H) 05/11/2015 1108   BASOSABS 0.0 08/07/2016 0302   BASOSABS 0.0 05/11/2015 1108    CMP     Component Value Date/Time   NA 135 09/26/2016 0849   NA 142 02/24/2013 0848   K 4.3 09/26/2016 0849   K 3.4 (L) 02/24/2013 0848   CL 102 09/26/2016 0849   CO2 23 09/26/2016 0849   CO2 24 02/24/2013 0848   GLUCOSE 112 (H) 09/26/2016 0849   GLUCOSE 121 02/24/2013 0848   GLUCOSE 93 03/25/2009   BUN 11 09/26/2016 0849   BUN 11.9 02/24/2013 0848   CREATININE 1.29 (H) 09/26/2016 0849   CREATININE 1.38 (H) 04/20/2016 1428   CREATININE 1.3 02/24/2013 0848   CALCIUM 9.0 09/26/2016 0849   CALCIUM 8.9 02/24/2013 0848   PROT 6.7 09/25/2016 0754   PROT 7.1 02/24/2013 0848   ALBUMIN 3.5 09/25/2016 0754   ALBUMIN 3.6 02/24/2013 0848   AST 18 09/25/2016 0754   AST 14 02/24/2013 0848   ALT 28 09/25/2016 0754   ALT 11 02/24/2013 0848   ALKPHOS 106 09/25/2016 0754   ALKPHOS 94 02/24/2013 0848   BILITOT 0.7 09/25/2016 0754   BILITOT 1.40 (H)  02/24/2013 0848   GFRNONAA >60 09/26/2016 0849   GFRNONAA 58 (L) 04/20/2016 1428   GFRAA >60 09/26/2016 0849   GFRAA 67 04/20/2016 1428   CLINICAL DATA:  Acute onset of shortness of breath and generalized abdominal pain. Initial encounter.   EXAM: CT ANGIOGRAPHY CHEST   CT ABDOMEN AND PELVIS WITH CONTRAST    TECHNIQUE: Multidetector CT imaging of the chest was performed using the standard protocol during bolus administration of intravenous contrast. This was repeated with a second dose of contrast, due to limited opacification of the pulmonary arteries. Multiplanar CT image reconstructions and MIPs were obtained to evaluate the vascular anatomy.   Multidetector CT imaging of the abdomen and pelvis was performed using the standard protocol during bolus administration of intravenous contrast.   CONTRAST:  180 mL of Isovue 370 IV contrast   COMPARISON:  CTA of the chest performed 11/11/2014, and CT of the abdomen and pelvis performed 02/02/2014   FINDINGS: CTA CHEST FINDINGS   Cardiovascular: Evaluation of the pulmonary arteries is markedly suboptimal due to limitations in the timing of the contrast bolus. Vague low-attenuation defect adjacent to the left main pulmonary artery is thought to reflect an underlying lymph node, as it does not appear to be in the vessel on coronal images.   The heart is normal in size. The thoracic aorta is grossly unremarkable. The great vessels are within normal limits.   Mediastinum/Nodes: The mediastinum is unremarkable in appearance. No mediastinal lymphadenopathy is seen. No pericardial effusion is identified. The thyroid gland is unremarkable. No axillary lymphadenopathy is seen.   Lungs/Pleura: Mild nodular opacities at the posterior aspect of the left upper lobe are concerning for mild pneumonia. No pleural effusion or pneumothorax is seen. No masses are identified.   Musculoskeletal: No acute osseous abnormalities are identified. The visualized musculature is unremarkable in appearance.   Review of the MIP images confirms the above findings.   CT ABDOMEN and PELVIS FINDINGS   Hepatobiliary: The liver is unremarkable in appearance. The patient is status post cholecystectomy, with clips noted at the gallbladder fossa. The common bile duct  remains normal in caliber.   Pancreas: The pancreas is within normal limits.   Spleen: The spleen is unremarkable in appearance.   Adrenals/Urinary Tract: A 1.8 cm right adrenal nodule is noted. The left adrenal gland is grossly unremarkable.   Scattered bilateral renal cysts are seen. The kidneys are otherwise grossly unremarkable. There is no evidence of hydronephrosis. No renal or ureteral stones are identified. No perinephric stranding is seen.   Stomach/Bowel: The stomach is unremarkable in appearance. The small bowel is within normal limits. The appendix is not visualized; there is no evidence for appendicitis.   Mild wall thickening is noted along the distal sigmoid colon, raising question for a mild infectious or inflammatory process. The colon is otherwise unremarkable in appearance.   Vascular/Lymphatic: The abdominal aorta is unremarkable in appearance. The inferior vena cava is grossly unremarkable. No retroperitoneal lymphadenopathy is seen. No pelvic sidewall lymphadenopathy is identified.   Reproductive: The bladder is mildly distended and grossly unremarkable. The patient is status post prostatectomy.   Other: A tiny periumbilical hernia is seen, containing only fat.   Musculoskeletal: No acute osseous abnormalities are identified. The visualized musculature is unremarkable in appearance.   Review of the MIP images confirms the above findings.   IMPRESSION: 1. Markedly suboptimal evaluation of the pulmonary arteries. No central pulmonary embolus characterized. 2. Mild nodular opacities at the posterior aspect of the  left upper lung lobe are concerning for mild pneumonia. 3. Mild wall thickening along the distal sigmoid colon, raising question for mild infectious or inflammatory process. 4. Stable 1.8 cm right adrenal nodule likely reflects an adrenal adenoma. 5. Tiny periumbilical hernia, containing only fat.     Electronically Signed   By: Garald Balding M.D.   On: 09/25/2016 02:36  ASSESSMENT/PLAN: 56 year old male with a past medical history of GERD, peptic ulcer disease, HIV, prostate cancer status post prostatectomy, chronic kidney disease, who is seen in consultation at the request of Jule Ser, DO to evaluate abdominal distention and abnormal CT scan of the colon.  1. Abdominal bloating and distention/abnormal CT scan of the colon/aching abdominal pain -- he has gained a considerable amount of weight and volume overload could certainly be contributing to his abdominal distention. I do not appreciate ascites nor was there ascites on his CT scan from March but I will perform abdominal ultrasound to rule out ascites. Given the thickening seen by CT scan, I recommended repeating colonoscopy. We discussed the risk benefits and alternatives and he is agreeable and wishes to proceed. I would like him to add Benefiber 1 working to 2 tablespoons daily to help prevent constipation and help with more regular bowel movements. Avoiding constipation will likely help his abdominal distention. Follow-up echocardiogram to rule out heart failure as contributing factor to abdominal and lower extremity edema. 2 day bowel preparation recommended given fair prep a colonoscopy 2 years ago  2.  GERD/dysphagia -- patient reports intermittent solid food dysphagia. I recommended he change omeprazole dose to 30 minutes before breakfast. This should increase absorption and efficacy. We'll perform upper endoscopy on the same day as his colonoscopy. Rule out esophagitis, rule out stricture. Possible dilation. We discussed the risks, benefits and alternatives and he is agreeable and wishes to proceed.      MB:[id number], Eyvonne Left [address] [place], [place] [id number]

## 2016-12-26 ENCOUNTER — Ambulatory Visit (HOSPITAL_COMMUNITY)
Admission: RE | Admit: 2016-12-26 | Discharge: 2016-12-26 | Disposition: A | Discharge status: Home / Self Care | Payer: Medicare Other | Source: Ambulatory Visit | Attending: Internal Medicine | Admitting: Internal Medicine

## 2016-12-26 DIAGNOSIS — Z9049 Acquired absence of other specified parts of digestive tract: Secondary | ICD-10-CM | POA: Insufficient documentation

## 2016-12-26 DIAGNOSIS — R600 Localized edema: Secondary | ICD-10-CM | POA: Insufficient documentation

## 2016-12-26 DIAGNOSIS — N281 Cyst of kidney, acquired: Secondary | ICD-10-CM | POA: Insufficient documentation

## 2016-12-26 DIAGNOSIS — R140 Abdominal distension (gaseous): Secondary | ICD-10-CM | POA: Insufficient documentation

## 2016-12-27 ENCOUNTER — Other Ambulatory Visit: Payer: Self-pay | Admitting: Infectious Diseases

## 2016-12-27 ENCOUNTER — Telehealth: Payer: Self-pay | Admitting: Neurology

## 2016-12-27 NOTE — Telephone Encounter (Signed)
Pt sister(April on DPR) calling for refill of tizanidine (ZANAFLEX) 6 MG capsule(she stated a 90 day supply is generally called in), please call into  [id number] - [place], [place] AT [address] [phone] (Phone) [phone] (Fax)

## 2016-12-27 NOTE — Telephone Encounter (Signed)
I called pt's sister, April, per DPR, and advised her that pt is not due for his tizanidine until 01/11/2017 and to contact his pharmacy. Pt's sister will contact [facility] and let us know if anything further is needed from [place] until closer to his refill due date.

## 2016-12-28 ENCOUNTER — Ambulatory Visit (HOSPITAL_COMMUNITY): Payer: Medicare Other

## 2017-01-15 ENCOUNTER — Emergency Department (HOSPITAL_COMMUNITY)
Admission: EM | Admit: 2017-01-15 | Discharge: 2017-01-16 | Disposition: A | Discharge status: Home / Self Care | Payer: Medicare Other | Attending: Emergency Medicine | Admitting: Emergency Medicine

## 2017-01-15 ENCOUNTER — Encounter (HOSPITAL_COMMUNITY): Payer: Self-pay | Admitting: Emergency Medicine

## 2017-01-15 ENCOUNTER — Emergency Department (HOSPITAL_COMMUNITY): Payer: Medicare Other

## 2017-01-15 DIAGNOSIS — N182 Chronic kidney disease, stage 2 (mild): Secondary | ICD-10-CM | POA: Diagnosis not present

## 2017-01-15 DIAGNOSIS — I129 Hypertensive chronic kidney disease with stage 1 through stage 4 chronic kidney disease, or unspecified chronic kidney disease: Secondary | ICD-10-CM | POA: Diagnosis not present

## 2017-01-15 DIAGNOSIS — Z7982 Long term (current) use of aspirin: Secondary | ICD-10-CM | POA: Insufficient documentation

## 2017-01-15 DIAGNOSIS — R05 Cough: Secondary | ICD-10-CM | POA: Diagnosis not present

## 2017-01-15 DIAGNOSIS — J449 Chronic obstructive pulmonary disease, unspecified: Secondary | ICD-10-CM | POA: Diagnosis not present

## 2017-01-15 DIAGNOSIS — R0602 Shortness of breath: Secondary | ICD-10-CM | POA: Diagnosis not present

## 2017-01-15 DIAGNOSIS — B20 Human immunodeficiency virus [HIV] disease: Secondary | ICD-10-CM | POA: Insufficient documentation

## 2017-01-15 DIAGNOSIS — J45909 Unspecified asthma, uncomplicated: Secondary | ICD-10-CM | POA: Insufficient documentation

## 2017-01-15 DIAGNOSIS — R3915 Urgency of urination: Secondary | ICD-10-CM | POA: Insufficient documentation

## 2017-01-15 DIAGNOSIS — R079 Chest pain, unspecified: Secondary | ICD-10-CM | POA: Insufficient documentation

## 2017-01-15 DIAGNOSIS — Z79899 Other long term (current) drug therapy: Secondary | ICD-10-CM | POA: Diagnosis not present

## 2017-01-15 DIAGNOSIS — R69 Illness, unspecified: Secondary | ICD-10-CM | POA: Insufficient documentation

## 2017-01-15 DIAGNOSIS — Z8546 Personal history of malignant neoplasm of prostate: Secondary | ICD-10-CM | POA: Insufficient documentation

## 2017-01-15 DIAGNOSIS — R509 Fever, unspecified: Secondary | ICD-10-CM | POA: Diagnosis not present

## 2017-01-15 DIAGNOSIS — Z96651 Presence of right artificial knee joint: Secondary | ICD-10-CM | POA: Insufficient documentation

## 2017-01-15 DIAGNOSIS — M25561 Pain in right knee: Secondary | ICD-10-CM | POA: Diagnosis not present

## 2017-01-15 DIAGNOSIS — J111 Influenza due to unidentified influenza virus with other respiratory manifestations: Secondary | ICD-10-CM

## 2017-01-15 DIAGNOSIS — Z87891 Personal history of nicotine dependence: Secondary | ICD-10-CM | POA: Diagnosis not present

## 2017-01-15 LAB — CG4 I-STAT (LACTIC ACID): Lactic Acid, Venous: 1.68 mmol/L (ref 0.5–1.9)

## 2017-01-15 NOTE — ED Notes (Signed)
First set of blood cultures obtained at 2352 from right upper arm 5ML each bottle.

## 2017-01-15 NOTE — ED Triage Notes (Signed)
Pt reports having fever and chills that started today along with shortness of breath.

## 2017-01-16 ENCOUNTER — Other Ambulatory Visit: Payer: Self-pay | Admitting: Neurology

## 2017-01-16 DIAGNOSIS — R509 Fever, unspecified: Secondary | ICD-10-CM | POA: Diagnosis not present

## 2017-01-16 LAB — COMPREHENSIVE METABOLIC PANEL
ALT: 18 U/L (ref 17–63)
AST: 24 U/L (ref 15–41)
Albumin: 4.4 g/dL (ref 3.5–5.0)
Alkaline Phosphatase: 105 U/L (ref 38–126)
Anion gap: 11 (ref 5–15)
BUN: 15 mg/dL (ref 6–20)
CO2: 25 mmol/L (ref 22–32)
Calcium: 9.4 mg/dL (ref 8.9–10.3)
Chloride: 103 mmol/L (ref 101–111)
Creatinine, Ser: 1.46 mg/dL — ABNORMAL HIGH (ref 0.61–1.24)
GFR calc Af Amer: >60 mL/min (ref >60)
GFR calc non Af Amer: 53 mL/min — ABNORMAL LOW (ref >60)
Glucose, Bld: 115 mg/dL — ABNORMAL HIGH (ref 65–99)
Potassium: 3.3 mmol/L — ABNORMAL LOW (ref 3.5–5.1)
Sodium: 139 mmol/L (ref 135–145)
Total Bilirubin: 0.7 mg/dL (ref 0.3–1.2)
Total Protein: 7.8 g/dL (ref 6.5–8.1)

## 2017-01-16 LAB — URINALYSIS, ROUTINE W REFLEX MICROSCOPIC
Bacteria, UA: NONE SEEN
Bilirubin Urine: NEGATIVE
Glucose, UA: NEGATIVE mg/dL
Ketones, ur: NEGATIVE mg/dL
Leukocytes, UA: NEGATIVE
Nitrite: NEGATIVE
Protein, ur: NEGATIVE mg/dL
Specific Gravity, Urine: 1.01 (ref 1.005–1.030)
pH: 5 (ref 5.0–8.0)

## 2017-01-16 LAB — CBC WITH DIFFERENTIAL/PLATELET
Basophils Absolute: 0 10*3/uL (ref 0.0–0.1)
Basophils Relative: 0 %
Eosinophils Absolute: 0.2 10*3/uL (ref 0.0–0.7)
Eosinophils Relative: 2 %
HCT: 40.7 % (ref 39.0–52.0)
Hemoglobin: 14 g/dL (ref 13.0–17.0)
Lymphocytes Relative: 9 %
Lymphs Abs: 0.8 10*3/uL (ref 0.7–4.0)
MCH: 31.7 pg (ref 26.0–34.0)
MCHC: 34.4 g/dL (ref 30.0–36.0)
MCV: 92.3 fL (ref 78.0–100.0)
Monocytes Absolute: 0.6 10*3/uL (ref 0.1–1.0)
Monocytes Relative: 7 %
Neutro Abs: 6.6 10*3/uL (ref 1.7–7.7)
Neutrophils Relative %: 82 %
Platelets: 178 10*3/uL (ref 150–400)
RBC: 4.41 MIL/uL (ref 4.22–5.81)
RDW: 13.8 % (ref 11.5–15.5)
WBC: 8.1 10*3/uL (ref 4.0–10.5)

## 2017-01-16 LAB — PROTIME-INR
INR: 0.95
Prothrombin Time: 12.7 seconds (ref 11.4–15.2)

## 2017-01-16 MED ORDER — DEXAMETHASONE SODIUM PHOSPHATE 10 MG/ML IJ SOLN
10.0000 mg | Freq: Once | INTRAMUSCULAR | Status: AC
  Administered 2017-01-16: 10 mg via INTRAVENOUS
  Filled 2017-01-16: qty 1

## 2017-01-16 MED ORDER — IPRATROPIUM-ALBUTEROL 0.5-2.5 (3) MG/3ML IN SOLN
3.0000 mL | Freq: Once | RESPIRATORY_TRACT | Status: AC
  Administered 2017-01-16: 3 mL via RESPIRATORY_TRACT
  Filled 2017-01-16: qty 3

## 2017-01-16 MED ORDER — ONDANSETRON HCL 4 MG/2ML IJ SOLN
4.0000 mg | Freq: Once | INTRAMUSCULAR | Status: AC
  Administered 2017-01-16: 4 mg via INTRAVENOUS
  Filled 2017-01-16: qty 2

## 2017-01-16 MED ORDER — ACETAMINOPHEN 325 MG PO TABS
650.0000 mg | ORAL_TABLET | Freq: Once | ORAL | Status: AC
  Administered 2017-01-16: 650 mg via ORAL
  Filled 2017-01-16: qty 2

## 2017-01-16 MED ORDER — SODIUM CHLORIDE 0.9 % IV BOLUS (SEPSIS)
1000.0000 mL | Freq: Once | INTRAVENOUS | Status: AC
  Administered 2017-01-16: 1000 mL via INTRAVENOUS

## 2017-01-16 NOTE — ED Notes (Signed)
2nd set of cultures obtained at 0100. 58mL in each bottle from R forearm.

## 2017-01-16 NOTE — Discharge Instructions (Signed)
Take acetaminophen every four hours as needed for fever. Continue using your albuterol inhaler and nebulizer as needed. Return if symptoms are getting worse.

## 2017-01-16 NOTE — ED Notes (Signed)
IV team and RN at bedside collecting labs and placing IV.

## 2017-01-16 NOTE — ED Provider Notes (Signed)
note [name] DEPT Provider Note   CSN: [id number] Arrival date & time: 01/15/17  2321  By signing my name below, I, Casey Brennan, attest that this documentation has been prepared under the direction and in the presence of Delora Fuel, MD. Electronically Signed: Lise Auer, ED Scribe. 01/16/17. 12:40 AM.  History   Chief Complaint Chief Complaint  Patient presents with  . Fever  . Shortness of Breath   The history is provided by the patient. No language interpreter was used.  Shortness of Breath  Associated symptoms include a fever, cough and chest pain. Pertinent negatives include no abdominal pain.   HPI Comments: Casey Brennan is a 56 y.o. male with a history of anemia, HIV, and HTN, who presents to the Emergency Department complaining of sudden onset, persistent fever and shortness of breath that began this afternoon, he rates the severity of his pain 7/10. Pt notes associated chills, cough with yellow phlegm production, mild chest pain, and urinary urgency. Pt was previously admitted on 09/24/16, and diagnosed with Pneumonia. He reports this symptoms are similar. Recent sick contact noted. Took Aleve PTA with mild relief. Denies constipation, diarrhea, abdominal pain, dysuria, or urinary frequency.    Past Medical History:  Diagnosis Date  . Arthritis    r knee   . Asthma    very rare  . Axillary lymphadenopathy   . Bronchitis   . Chronic anemia    normocytic  . Chronic folliculitis   . Elevated PSA   . Environmental and seasonal allergies   . GERD (gastroesophageal reflux disease)   . Gross hematuria   . H/O pericarditis    2010--  myopercarditis--  resolved  . HCAP (healthcare-associated pneumonia) 11/19/2014  . Headache(784.0)    HX SEVERE FRONTAL HA'S  . Hiatal hernia   . History of concussion    2012  &  2013  RESIDUAL HA'S --  RESOLVED  . History of gastric ulcer   . History of kidney stones   . History of MRSA infection 2010   infected boil  . HIV  (human immunodeficiency virus infection) ([place]) 1988  . Hypertension   . Internal hemorrhoids   . Lytic bone lesion of hip    WORK-UP DONE BY ONCOLOGIST DR HA --  NOT MALIGNANT  . Pneumonia    hx of  . Post concussion syndrome    resolved  . Prostate cancer ([place]) 04/25/13   gleason 3+3=6, volume 30 gm  . Sinusitis, chronic 05/14/2015  . Ulcer    hx of gastric  . Wears glasses     Patient Active Problem List   Diagnosis Date Noted  . Dyspnea 11/02/2016  . Constipation 09/27/2016  . Primary osteoarthritis of right knee 08/01/2016  . Chronic renal insufficiency, stage 2 (mild) 05/22/2016  . Skin lesion of right upper extremity 02/22/2016  . Migraines 02/22/2016  . Low back pain 02/22/2016  . Osteoarthritis of right knee 01/11/2016  . Kidney stones 10/25/2015  . Sinusitis, chronic 05/14/2015  . Hereditary and idiopathic peripheral neuropathy 09/02/2014  . GERD (gastroesophageal reflux disease) 07/29/2014  . COPD GOLD III 07/22/2014  . Hx of primary hypertension 04/27/2014  . H/O prostate cancer   . Lytic bone lesion of hip 08/22/2012  . Cataract, nuclear 05/15/2012  . Asthma 01/23/2012  . Insomnia 05/11/2011  . HTN (hypertension) 04/17/2011  . Mood disorder in conditions classified elsewhere 06/14/2009  . Human immunodeficiency virus (HIV) disease ([place]) 04/19/2009    Past Surgical History:  Procedure  Laterality Date  . CARDIAC CATHETERIZATION  03-23-2009  DR COOPER   NORMAL CORONARY ARTERIES  . CHOLECYSTECTOMY N/A 11/06/2014   Procedure: LAPAROSCOPIC CHOLECYSTECTOMY WITH INTRAOPERATIVE CHOLANGIOGRAM;  Surgeon: Judeth Horn, MD;  Location: [facility];  Service: General;  Laterality: N/A;  . COLONOSCOPY  12/26/2011   Procedure: COLONOSCOPY;  Surgeon: Lear Ng, MD;  Location: [facility];  Service: Endoscopy;  Laterality: N/A;  . COLONOSCOPY WITH PROPOFOL N/A 07/29/2014   Procedure: COLONOSCOPY WITH PROPOFOL;  Surgeon: Lear Ng, MD;  Location: [facility];   Service: Endoscopy;  Laterality: N/A;  . CYSTOSCOPY/RETROGRADE/URETEROSCOPY Bilateral 04/25/2013   Procedure: CYSTOSCOPY/ BILATERAL RETROGRADES; BLADDER BIOPSIES;  Surgeon: Alexis Frock, MD;  Location[facility];  Service: Urology;  Laterality: Bilateral;  . DENTAL EXAMINATION UNDER ANESTHESIA    . ESOPHAGOGASTRODUODENOSCOPY  12/26/2011   Procedure: ESOPHAGOGASTRODUODENOSCOPY (EGD);  Surgeon: Lear Ng, MD;  Location: [facility];  Service: Endoscopy;  Laterality: N/A;  . ESOPHAGOGASTRODUODENOSCOPY (EGD) WITH PROPOFOL N/A 07/29/2014   Procedure: ESOPHAGOGASTRODUODENOSCOPY (EGD) WITH PROPOFOL;  Surgeon: Lear Ng, MD;  Location: [facility];  Service: Endoscopy;  Laterality: N/A;  . EXCISION CHRONIC LEFT BREAST ABSCESS  09-21-2010  . EXCISIONAL BX LEFT BREAST MASS/  I  &  D LEFT BREAST ABSCESS  03-24-2009  . KNEE ARTHROSCOPY Right 1985  . left axilla biopsy  04/2013  . left hip biopsy  10/2012  . LYMPHADENECTOMY Bilateral 08/18/2013   Procedure: LYMPHADENECTOMY "PELVIC LYMPH NODE DISSECTION";  Surgeon: Alexis Frock, MD;  Location: [facility];  Service: Urology;  Laterality: Bilateral;  . PROSTATE BIOPSY N/A 04/25/2013   Procedure: BIOPSY TRANSRECTAL ULTRASONIC PROSTATE (TUBP);  Surgeon: Alexis Frock, MD;  Location[facility];  Service: Urology;  Laterality: N/A;  . ROBOT ASSISTED LAPAROSCOPIC RADICAL PROSTATECTOMY N/A 08/18/2013   Procedure: ROBOTIC ASSISTED LAPAROSCOPIC RADICAL PROSTATECTOMY;  Surgeon: Alexis Frock, MD;  Location: [facility];  Service: Urology;  Laterality: N/A;  . TOTAL KNEE ARTHROPLASTY Right 08/01/2016  . TOTAL KNEE ARTHROPLASTY Right 08/01/2016   Procedure: TOTAL KNEE ARTHROPLASTY;  Surgeon: Renette Butters, MD;  Location: [facility];  Service: Orthopedics;  Laterality: Right;  . UPPER LEG SOFT TISSUE BIOPSY Left 2012   lthigh    Home Medications    Prior to Admission medications   Medication Sig Start Date End Date  Taking? Authorizing Provider  albuterol (PROVENTIL HFA;VENTOLIN HFA) 108 (90 Base) MCG/ACT inhaler Inhale 2 puffs into the lungs every 4 (four) hours as needed for wheezing or shortness of breath. 05/22/16   Campbell Riches, MD  albuterol (PROVENTIL) (2.5 MG/3ML) 0.083% nebulizer solution Take 3 mLs (2.5 mg total) by nebulization every 6 (six) hours as needed for wheezing or shortness of breath. 05/22/16   Campbell Riches, MD  ALPRAZolam Duanne Moron) 0.5 MG tablet TAKE 1 TABLET BY MOUTH AT BEDTIME AS NEEDED FOR ANXIETY 11/29/16   Dohmeier, Asencion Partridge, MD  aspirin EC 81 MG tablet Take 81 mg by mouth See admin instructions. Monday, Wednesday, & Saturday At bedtime    [provider]  Beclomethasone Dipropionate 80 MCG/ACT AERS Place 2 sprays into the nose once. 11/02/16 11/02/16  Jule Ser, DO  darunavir (PREZISTA) 800 MG tablet TAKE 1 TABLET(800 MG) BY MOUTH DAILY 12/04/16   Campbell Riches, MD  diclofenac sodium (VOLTAREN) 1 % GEL Apply 2 g topically 2 (two) times daily as needed (pain). 11/02/16   Jule Ser, DO  gabapentin (NEURONTIN) 300 MG capsule TAKE 1 CAPSULE(300 MG) BY MOUTH FOUR TIMES  DAILY AS NEEDED FOR NERVE PAIN 10/09/16   Dohmeier, Asencion Partridge, MD  hydrochlorothiazide (HYDRODIURIL) 25 MG tablet Take 1 tablet (25 mg total) by mouth daily. Patient taking differently: Take 25 mg by mouth at bedtime.  10/14/15   Konrad Felix, PA  hydrOXYzine (ATARAX/VISTARIL) 25 MG tablet TAKE 1 TABLET(25 MG) BY MOUTH EVERY NIGHT 12/26/16   Dohmeier, Asencion Partridge, MD  ipratropium (ATROVENT) 0.02 % nebulizer solution USE 2.5 ML(0.5 MG) VIA NEBULIZER THREE TIMES DAILY AS NEEDED FOR WHEEZING OR SHORTNESS OF BREATH 11/02/16   Jule Ser, DO  ISENTRESS 400 MG tablet Take 800 mg by mouth at bedtime. 09/10/16   [provider]  losartan (COZAAR) 25 MG tablet Take 1 tablet (25 mg total) by mouth daily. 11/02/16 11/02/17  Jule Ser, DO  mometasone-formoterol (DULERA) 200-5 MCG/ACT AERO Take 2 puffs first  thing in am and then another 2 puffs about 12 hours later. Patient taking differently: Inhale 1 puff into the lungs every 12 (twelve) hours.  07/22/14   Tanda Rockers, MD  montelukast (SINGULAIR) 10 MG tablet Take 1 tablet (10 mg total) by mouth at bedtime. 08/28/16   Campbell Riches, MD  Multiple Vitamin (MULTIVITAMIN WITH MINERALS) TABS tablet Take 1 tablet by mouth at bedtime.    [provider]  Na Sulfate-K Sulfate-Mg Sulf 17.5-3.13-1.6 GM/180ML SOLN Suprep: [place] as directed 12/25/16   Pyrtle, Lajuan Lines, MD  NORVIR 100 MG TABS tablet Take 100 mg by mouth at bedtime. 08/18/16   [provider]  omeprazole (PRILOSEC) 40 MG capsule Take 1 capsule (40 mg total) by mouth every morning. 12/25/16   Pyrtle, Lajuan Lines, MD  ondansetron (ZOFRAN) 4 MG tablet TAKE 1 TABLET BY MOUTH EVERY 8 HOURS AS NEEDED FOR NAUSEA 11/28/16   Campbell Riches, MD  Polyethyl Glycol-Propyl Glycol (SYSTANE) 0.4-0.3 % GEL ophthalmic gel Place 1 application into both eyes daily.    [provider]  SUMAtriptan (IMITREX) 100 MG tablet Take 1 tablet (100 mg total) by mouth every 2 (two) hours as needed for migraine. May repeat in 2 hours if headache persists or recurs. 08/07/16   Davonna Belling, MD  tizanidine (ZANAFLEX) 6 MG capsule TAKE ONE CAPSULE BY MOUTH THREE TIMES DAILY AS NEEDED FOR MUSCLE SPASMS 10/12/16   Dohmeier, Asencion Partridge, MD  torsemide (DEMADEX) 20 MG tablet Take 1 tablet (20 mg total) by mouth daily. 11/02/16 11/02/17  Jule Ser, DO  TRUVADA 200-300 MG tablet Take 1 tablet by mouth at bedtime. 09/10/16   [provider]  zolpidem (AMBIEN) 5 MG tablet Take 1-2 tablets (5-10 mg total) by mouth at bedtime as needed for sleep. 12/21/16   Jule Ser, DO    Family History Family History  Problem Relation Age of Onset  . Stroke Father   . Diabetes Father   . Cancer Father        brain cancer  . Asthma Father   . Hypertension Sister   . Cancer Maternal Uncle        prostate cancer  .  Asthma Sister     Social History Social History  Substance Use Topics  . Smoking status: Former Smoker    Packs/day: 1.50    Years: 23.00    Types: Cigars, Cigarettes    Quit date: 05/22/2010  . Smokeless tobacco: Never Used  . Alcohol use No   Allergies   Chocolate; Cocoa; Morphine and related; Penicillins; Sulfonamide derivatives; Tomato; Vancomycin; Flagyl [metronidazole]; Sulfa antibiotics; and Tramadol  Review of Systems  Review of Systems  Constitutional: Positive for chills and fever.  Respiratory: Positive for cough and shortness of breath.   Cardiovascular: Positive for chest pain.  Gastrointestinal: Negative for abdominal pain, constipation and diarrhea.  Genitourinary: Positive for urgency. Negative for frequency.  All other systems reviewed and are negative.  Physical Exam Updated Vital Signs BP (!) 154/85 (BP Location: Left Arm)   Pulse (!) 124   Temp (!) 102.6 F (39.2 C) (Oral)   Resp (!) 22   Ht 5\' 9"  (1.753 m)   Wt 229 lb (103.9 kg)   SpO2 93%   BMI 33.82 kg/m   Physical Exam  Constitutional: He is oriented to person, place, and time. He appears well-developed and well-nourished.  HENT:  Head: Normocephalic and atraumatic.  Eyes: Pupils are equal, round, and reactive to light. EOM are normal.  Neck: Normal range of motion. Neck supple. No JVD present.  Cardiovascular: Normal rate, regular rhythm and normal heart sounds.   No murmur heard. Pulmonary/Chest: Effort normal and breath sounds normal. He has no wheezes. He has no rales. He exhibits no tenderness.  Abdominal: Soft. Bowel sounds are normal. He exhibits no distension and no mass. There is no tenderness.  Musculoskeletal: Normal range of motion. He exhibits no edema.  Lymphadenopathy:    He has no cervical adenopathy.  Neurological: He is alert and oriented to person, place, and time. No cranial nerve deficit. He exhibits normal muscle tone. Coordination normal.  Skin: Skin is warm and dry.  No rash noted.  Psychiatric: He has a normal mood and affect. His behavior is normal. Judgment and thought content normal.  Nursing note and vitals reviewed.  ED Treatments / Results  DIAGNOSTIC STUDIES: Oxygen Saturation is 93% on RA, low by my interpretation.   COORDINATION OF CARE: 12:34 AM-Discussed next steps with pt. Pt verbalized understanding and is agreeable with the plan.   Labs (all labs ordered are listed, but only abnormal results are displayed) Labs Reviewed  COMPREHENSIVE METABOLIC PANEL - Abnormal; Notable for the following:       Result Value   Potassium 3.3 (*)    Glucose, Bld 115 (*)    Creatinine, Ser 1.46 (*)    GFR calc non Af Amer 53 (*)    All other components within normal limits  URINALYSIS, ROUTINE W REFLEX MICROSCOPIC - Abnormal; Notable for the following:    Hgb urine dipstick SMALL (*)    Squamous Epithelial / LPF 0-5 (*)    All other components within normal limits  CULTURE, BLOOD (ROUTINE X 2)  CULTURE, BLOOD (ROUTINE X 2)  CBC WITH DIFFERENTIAL/PLATELET  PROTIME-INR  I-STAT CG4 LACTIC ACID, ED  CG4 I-STAT (LACTIC ACID)    Radiology Dg Chest 2 View  Result Date: 01/16/2017 CLINICAL DATA:  56 year old male with fever and chills. EXAM: CHEST  2 VIEW COMPARISON:  Chest radiograph dated 11/02/2016 FINDINGS: The lungs are clear. There is no pleural effusion or pneumothorax. Bilateral nipple shadows noted. There is mild bronchiectatic changes. The cardiac silhouette is within normal limits. No acute osseous pathology. IMPRESSION: No active cardiopulmonary disease. Electronically Signed   By: Anner Crete M.D.   On: 01/16/2017 00:24    Procedures Procedures (including critical care time)  Medications Ordered in ED Medications  dexamethasone (DECADRON) injection 10 mg (not administered)  sodium chloride 0.9 % bolus 1,000 mL (0 mLs Intravenous Stopped 01/16/17 0535)  ondansetron (ZOFRAN) injection 4 mg (4 mg Intravenous Given 01/16/17 0143)    ipratropium-albuterol (DUONEB)  0.5-2.5 (3) MG/3ML nebulizer solution 3 mL (3 mLs Nebulization Given 01/16/17 0120)  acetaminophen (TYLENOL) tablet 650 mg (650 mg Oral Given 01/16/17 0143)     Initial Impression / Assessment and Plan / ED Course  I have reviewed the triage vital signs and the nursing notes.  Pertinent labs & imaging results that were available during my care of the patient were reviewed by me and considered in my medical decision making (see chart for details).  Fever in patient with history of HIV. Review of old records shows last viral load was undetectable, so he can be managed as someone with competent immune system. He had been admitted in March of this year for community-acquired pneumonia. Chest x-ray today is unremarkable. Lactic acid level is normal and WBC is normal. There has been a slight increase in his creatinine, but not significant. Urinalysis shows no evidence of infection. Blood and urine cultures are pending. Patient is nontoxic in appearance. He was given albuterol with ipratropium which did give him relief of his dyspnea. He is given a dose of dexamethasone. At this point, I do not see indications for antibiotics. He is discharged with instructions to use acetaminophen for fever, continue home nebulizer and inhaler as needed. Return should symptoms worsen.  Final Clinical Impressions(s) / ED Diagnoses   Final diagnoses:  Influenza-like illness    New Prescriptions New Prescriptions   No medications on file   I personally performed the services described in this documentation, which was scribed in my presence. The recorded information has been reviewed and is accurate.       Delora Fuel, MD 69/62/95 [phone]

## 2017-01-20 ENCOUNTER — Other Ambulatory Visit: Payer: Self-pay | Admitting: Internal Medicine

## 2017-01-21 ENCOUNTER — Other Ambulatory Visit: Payer: Self-pay | Admitting: Infectious Diseases

## 2017-01-21 ENCOUNTER — Other Ambulatory Visit: Payer: Self-pay | Admitting: Neurology

## 2017-01-21 DIAGNOSIS — G44229 Chronic tension-type headache, not intractable: Secondary | ICD-10-CM

## 2017-01-21 DIAGNOSIS — G250 Essential tremor: Secondary | ICD-10-CM

## 2017-01-21 DIAGNOSIS — R252 Cramp and spasm: Secondary | ICD-10-CM

## 2017-01-21 DIAGNOSIS — G63 Polyneuropathy in diseases classified elsewhere: Principal | ICD-10-CM

## 2017-01-21 DIAGNOSIS — B20 Human immunodeficiency virus [HIV] disease: Secondary | ICD-10-CM

## 2017-01-21 LAB — CULTURE, BLOOD (ROUTINE X 2)
Culture: NO GROWTH
Culture: NO GROWTH
Special Requests: ADEQUATE

## 2017-01-22 ENCOUNTER — Ambulatory Visit (INDEPENDENT_AMBULATORY_CARE_PROVIDER_SITE_OTHER): Payer: Medicare Other

## 2017-01-22 ENCOUNTER — Ambulatory Visit (HOSPITAL_COMMUNITY)
Admission: EM | Admit: 2017-01-22 | Discharge: 2017-01-22 | Disposition: A | Discharge status: Home / Self Care | Payer: Medicare Other | Attending: Internal Medicine | Admitting: Internal Medicine

## 2017-01-22 ENCOUNTER — Encounter (HOSPITAL_COMMUNITY): Payer: Self-pay | Admitting: *Deleted

## 2017-01-22 DIAGNOSIS — R05 Cough: Secondary | ICD-10-CM | POA: Diagnosis not present

## 2017-01-22 DIAGNOSIS — Z79899 Other long term (current) drug therapy: Secondary | ICD-10-CM | POA: Insufficient documentation

## 2017-01-22 DIAGNOSIS — Z21 Asymptomatic human immunodeficiency virus [HIV] infection status: Secondary | ICD-10-CM | POA: Insufficient documentation

## 2017-01-22 DIAGNOSIS — K219 Gastro-esophageal reflux disease without esophagitis: Secondary | ICD-10-CM | POA: Insufficient documentation

## 2017-01-22 DIAGNOSIS — Z7982 Long term (current) use of aspirin: Secondary | ICD-10-CM | POA: Insufficient documentation

## 2017-01-22 DIAGNOSIS — J40 Bronchitis, not specified as acute or chronic: Secondary | ICD-10-CM | POA: Diagnosis not present

## 2017-01-22 DIAGNOSIS — I10 Essential (primary) hypertension: Secondary | ICD-10-CM | POA: Diagnosis not present

## 2017-01-22 DIAGNOSIS — R509 Fever, unspecified: Secondary | ICD-10-CM | POA: Diagnosis not present

## 2017-01-22 LAB — POCT RAPID STREP A: Streptococcus, Group A Screen (Direct): NEGATIVE

## 2017-01-22 MED ORDER — BENZONATATE 100 MG PO CAPS: 100.0000 mg | ORAL_CAPSULE | Freq: Three times a day (TID) | ORAL | 0 refills | Status: DC

## 2017-01-22 MED ORDER — PREDNISONE 10 MG (21) PO TBPK: ORAL_TABLET | Freq: Every day | ORAL | 0 refills | Status: DC

## 2017-01-22 MED ORDER — LIDOCAINE VISCOUS 2 % MT SOLN: 10.0000 mL | OROMUCOSAL | 0 refills | Status: DC | PRN

## 2017-01-22 MED ORDER — LEVOFLOXACIN 500 MG PO TABS: 500.0000 mg | ORAL_TABLET | Freq: Every day | ORAL | 0 refills | Status: DC

## 2017-01-22 MED ORDER — HYDROCOD POLST-CPM POLST ER 10-8 MG/5ML PO SUER: 5.0000 mL | Freq: Two times a day (BID) | ORAL | 0 refills | Status: AC | PRN

## 2017-01-22 NOTE — Discharge Instructions (Signed)
Start Levaquin and prednisone as directed. Use lidocaine solution for sore throat. Gurgle and swallow slowly, avoid food/liquid intake for next 30 mins as lidocaine can suppress gag reflex. Continue over-the-counter decongestions. Continue nasal sprays. Keep hydrated. Monitor for worsening of symptoms or symptoms not improved after treatment, follow-up the emergency room for further workup.

## 2017-01-22 NOTE — ED Notes (Addendum)
Patient and family member are requesting specifically tussionex.  Tessalon pearles were called in already.  Patient and family member are requesting provider to come to room, provider notified of patient and caregivers request

## 2017-01-22 NOTE — ED Provider Notes (Signed)
CSN: [id number]     Arrival date & time 01/22/17  1535 History   None    Chief Complaint  Patient presents with  . Cough   (Consider location/radiation/quality/duration/timing/severity/associated sxs/prior Treatment) HPI 56 year old male with history of HIV, hypertension, seasonal allergies, GERD, comes in for worsening of symptoms with nasal congestion, cough, congestion after being seen at the emergency department 1 week ago. Patient had experienced wheezing while at the emergency department, and was given Decadron at that time. Patient states he continues to have productive cough. And now developed sores around his mouth. He continues to run a fever of 102, last dose of antipyretics 4 hours prior to arrival.   Past Medical History:  Diagnosis Date  . Arthritis    r knee   . Asthma    very rare  . Axillary lymphadenopathy   . Bronchitis   . Chronic anemia    normocytic  . Chronic folliculitis   . Elevated PSA   . Environmental and seasonal allergies   . GERD (gastroesophageal reflux disease)   . Gross hematuria   . H/O pericarditis    2010--  myopercarditis--  resolved  . HCAP (healthcare-associated pneumonia) 11/19/2014  . Headache(784.0)    HX SEVERE FRONTAL HA'S  . Hiatal hernia   . History of concussion    2012  &  2013  RESIDUAL HA'S --  RESOLVED  . History of gastric ulcer   . History of kidney stones   . History of MRSA infection 2010   infected boil  . HIV (human immunodeficiency virus infection) ([place]) 1988  . Hypertension   . Internal hemorrhoids   . Lytic bone lesion of hip    WORK-UP DONE BY ONCOLOGIST DR HA --  NOT MALIGNANT  . Pneumonia    hx of  . Post concussion syndrome    resolved  . Prostate cancer ([place]) 04/25/13   gleason 3+3=6, volume 30 gm  . Sinusitis, chronic 05/14/2015  . Ulcer    hx of gastric  . Wears glasses    Past Surgical History:  Procedure Laterality Date  . CARDIAC CATHETERIZATION  03-23-2009  DR COOPER   NORMAL CORONARY  ARTERIES  . CHOLECYSTECTOMY N/A 11/06/2014   Procedure: LAPAROSCOPIC CHOLECYSTECTOMY WITH INTRAOPERATIVE CHOLANGIOGRAM;  Surgeon: Judeth Horn, MD;  Location: [place];  Service: General;  Laterality: N/A;  . COLONOSCOPY  12/26/2011   Procedure: COLONOSCOPY;  Surgeon: Lear Ng, MD;  Location: [facility];  Service: Endoscopy;  Laterality: N/A;  . COLONOSCOPY WITH PROPOFOL N/A 07/29/2014   Procedure: COLONOSCOPY WITH PROPOFOL;  Surgeon: Lear Ng, MD;  Location: [place];  Service: Endoscopy;  Laterality: N/A;  . CYSTOSCOPY/RETROGRADE/URETEROSCOPY Bilateral 04/25/2013   Procedure: CYSTOSCOPY/ BILATERAL RETROGRADES; BLADDER BIOPSIES;  Surgeon: Alexis Frock, MD;  Location: [facility];  Service: Urology;  Laterality: Bilateral;  . DENTAL EXAMINATION UNDER ANESTHESIA    . ESOPHAGOGASTRODUODENOSCOPY  12/26/2011   Procedure: ESOPHAGOGASTRODUODENOSCOPY (EGD);  Surgeon: Lear Ng, MD;  Location: [facility];  Service: Endoscopy;  Laterality: N/A;  . ESOPHAGOGASTRODUODENOSCOPY (EGD) WITH PROPOFOL N/A 07/29/2014   Procedure: ESOPHAGOGASTRODUODENOSCOPY (EGD) WITH PROPOFOL;  Surgeon: Lear Ng, MD;  Location: [place];  Service: Endoscopy;  Laterality: N/A;  . EXCISION CHRONIC LEFT BREAST ABSCESS  09-21-2010  . EXCISIONAL BX LEFT BREAST MASS/  I  &  D LEFT BREAST ABSCESS  03-24-2009  . KNEE ARTHROSCOPY Right 1985  . left axilla biopsy  04/2013  . left hip biopsy  10/2012  .  LYMPHADENECTOMY Bilateral 08/18/2013   Procedure: LYMPHADENECTOMY "PELVIC LYMPH NODE DISSECTION";  Surgeon: Alexis Frock, MD;  Location: [facility];  Service: Urology;  Laterality: Bilateral;  . PROSTATE BIOPSY N/A 04/25/2013   Procedure: BIOPSY TRANSRECTAL ULTRASONIC PROSTATE (TUBP);  Surgeon: Alexis Frock, MD;  Location: [facility];  Service: Urology;  Laterality: N/A;  . ROBOT ASSISTED LAPAROSCOPIC RADICAL PROSTATECTOMY N/A 08/18/2013   Procedure: ROBOTIC  ASSISTED LAPAROSCOPIC RADICAL PROSTATECTOMY;  Surgeon: Alexis Frock, MD;  Location: [facility];  Service: Urology;  Laterality: N/A;  . TOTAL KNEE ARTHROPLASTY Right 08/01/2016  . TOTAL KNEE ARTHROPLASTY Right 08/01/2016   Procedure: TOTAL KNEE ARTHROPLASTY;  Surgeon: Renette Butters, MD;  Location: [place];  Service: Orthopedics;  Laterality: Right;  . UPPER LEG SOFT TISSUE BIOPSY Left 2012   lthigh   Family History  Problem Relation Age of Onset  . Stroke Father   . Diabetes Father   . Cancer Father        brain cancer  . Asthma Father   . Hypertension Sister   . Cancer Maternal Uncle        prostate cancer  . Asthma Sister    Social History  Substance Use Topics  . Smoking status: Former Smoker    Packs/day: 1.50    Years: 23.00    Types: Cigars, Cigarettes    Quit date: 05/22/2010  . Smokeless tobacco: Never Used  . Alcohol use No    Review of Systems  Constitutional: Positive for fever. Negative for chills and diaphoresis.  HENT: Positive for congestion, postnasal drip, rhinorrhea, sore throat and voice change. Negative for trouble swallowing.   Eyes: Negative for pain, discharge, redness and itching.  Respiratory: Positive for cough, shortness of breath and wheezing.   Cardiovascular: Negative for chest pain and palpitations.  Gastrointestinal: Negative for abdominal pain, constipation, diarrhea, nausea and vomiting.    Allergies  Chocolate; Cocoa; Morphine and related; Penicillins; Sulfonamide derivatives; Tomato; Vancomycin; Flagyl [metronidazole]; Sulfa antibiotics; and Tramadol  Home Medications   Prior to Admission medications   Medication Sig Start Date End Date Taking? Authorizing Provider  albuterol (PROVENTIL HFA;VENTOLIN HFA) 108 (90 Base) MCG/ACT inhaler Inhale 2 puffs into the lungs every 4 (four) hours as needed for wheezing or shortness of breath. 05/22/16   Campbell Riches, MD  albuterol (PROVENTIL) (2.5 MG/3ML) 0.083% nebulizer solution Take 3 mLs  (2.5 mg total) by nebulization every 6 (six) hours as needed for wheezing or shortness of breath. 05/22/16   Campbell Riches, MD  ALPRAZolam (XANAX) 0.5 MG tablet TAKE 1 TABLET BY MOUTH AT BEDTIME AS NEEDED FOR ANXIETY Patient taking differently: TAKE 0.5 MG BY MOUTH AT BEDTIME AS NEEDED FOR ANXIETY 11/29/16   Dohmeier, Asencion Partridge, MD  aspirin EC 81 MG tablet Take 81 mg by mouth daily after breakfast.     [provider]  Beclomethasone Dipropionate 80 MCG/ACT AERS Place 2 sprays into the nose once. Patient taking differently: Place 2 sprays into both nostrils 2 (two) times daily.  11/02/16 01/16/17  Jule Ser, DO  benzonatate (TESSALON) 100 MG capsule Take 1 capsule (100 mg total) by mouth every 8 (eight) hours. 01/22/17   Tasia Catchings, Aigner Horseman V, PA-C  chlorpheniramine-HYDROcodone (TUSSIONEX PENNKINETIC ER) 10-8 MG/5ML SUER Take 5 mLs by mouth every 12 (twelve) hours as needed for cough. 01/22/17 01/27/17  Tasia Catchings, Corby Vandenberghe V, PA-C  darunavir (PREZISTA) 800 MG tablet TAKE 1 TABLET(800 MG) BY MOUTH DAILY Patient taking differently: Take 800 mg by mouth at bedtime.  12/04/16   Campbell Riches, MD  diclofenac sodium (VOLTAREN) 1 % GEL Apply 2 g topically 2 (two) times daily as needed (pain). 11/02/16   Jule Ser, DO  emtricitabine-tenofovir (TRUVADA) 200-300 MG tablet Take 1 tablet by mouth daily. Take with Prezista, Norvir and Isentress daily. 01/22/17   Campbell Riches, MD  gabapentin (NEURONTIN) 300 MG capsule TAKE 1 CAPSULE(300 MG) BY MOUTH FOUR TIMES DAILY AS NEEDED FOR NERVE PAIN 10/09/16   Dohmeier, Asencion Partridge, MD  hydrochlorothiazide (HYDRODIURIL) 25 MG tablet Take 1 tablet (25 mg total) by mouth daily. Patient taking differently: Take 25 mg by mouth daily after breakfast.  10/14/15   Linde Gillis C, PA  hydrOXYzine (ATARAX/VISTARIL) 25 MG tablet TAKE 1 TABLET(25 MG) BY MOUTH EVERY NIGHT 01/22/17   Dohmeier, Asencion Partridge, MD  ipratropium (ATROVENT) 0.02 % nebulizer solution USE 2.5 ML(0.5 MG) VIA NEBULIZER THREE  TIMES DAILY AS NEEDED FOR WHEEZING OR SHORTNESS OF BREATH 11/02/16   Jule Ser, DO  ISENTRESS 400 MG tablet Take 800 mg by mouth at bedtime. 09/10/16   [provider]  levofloxacin (LEVAQUIN) 500 MG tablet Take 1 tablet (500 mg total) by mouth daily. 01/22/17   Tasia Catchings, Jasia Hiltunen V, PA-C  lidocaine (XYLOCAINE) 2 % solution Use as directed 10 mLs in the mouth or throat as needed for mouth pain. 01/22/17   Tasia Catchings, Skylin Kennerson V, PA-C  losartan (COZAAR) 25 MG tablet Take 1 tablet (25 mg total) by mouth daily. Patient taking differently: Take 25 mg by mouth daily after breakfast.  11/02/16 11/02/17  Jule Ser, DO  metoprolol succinate (TOPROL-XL) 25 MG 24 hr tablet Take 25 mg by mouth daily after breakfast. 12/25/16   [provider]  mometasone-formoterol (DULERA) 200-5 MCG/ACT AERO Take 2 puffs first thing in am and then another 2 puffs about 12 hours later. Patient taking differently: Inhale 1 puff into the lungs every 12 (twelve) hours.  07/22/14   Tanda Rockers, MD  montelukast (SINGULAIR) 10 MG tablet Take 1 tablet (10 mg total) by mouth at bedtime. Patient taking differently: Take 10 mg by mouth daily after breakfast.  08/28/16   Campbell Riches, MD  Multiple Vitamin (MULTIVITAMIN WITH MINERALS) TABS tablet Take 1 tablet by mouth daily after breakfast.     [provider]  NORVIR 100 MG TABS tablet Take 100 mg by mouth at bedtime. 08/18/16   [provider]  omeprazole (PRILOSEC) 40 MG capsule Take 1 capsule (40 mg total) by mouth every morning. 12/25/16   Pyrtle, Lajuan Lines, MD  ondansetron (ZOFRAN) 4 MG tablet TAKE 1 TABLET BY MOUTH EVERY 8 HOURS AS NEEDED FOR NAUSEA Patient taking differently: TAKE 4 MG BY MOUTH EVERY 8 HOURS AS NEEDED FOR NAUSEA 11/28/16   Campbell Riches, MD  Polyethyl Glycol-Propyl Glycol (SYSTANE) 0.4-0.3 % GEL ophthalmic gel Place 1 application into both eyes 2 (two) times daily.     [provider]  predniSONE (STERAPRED UNI-PAK 21 TAB) 10 MG (21)  TBPK tablet Take by mouth daily. Take 6 tabs by mouth day 1, then 5 tabs, then 4 tabs, then 3 tabs, 2 tabs, then 1 tab for the last day 01/22/17   Ok Edwards, PA-C  SUMAtriptan (IMITREX) 100 MG tablet Take 1 tablet (100 mg total) by mouth every 2 (two) hours as needed for migraine. May repeat in 2 hours if headache persists or recurs. 08/07/16   Davonna Belling, MD  tizanidine (ZANAFLEX) 6 MG capsule TAKE 1 CAPSULE BY MOUTH THREE  TIMES DAILY AS NEEDED FOR MUSCLE SPASMS 01/17/17   Dohmeier, Asencion Partridge, MD  torsemide (DEMADEX) 20 MG tablet Take 1 tablet (20 mg total) by mouth daily. Patient taking differently: Take 20 mg by mouth daily after breakfast.  11/02/16 11/02/17  Jule Ser, DO  zolpidem (AMBIEN) 5 MG tablet Take 1-2 tablets (5-10 mg total) by mouth at bedtime as needed for sleep. 12/21/16   Jule Ser, DO   Meds Ordered and Administered this Visit  Medications - No data to display  BP 140/84 (BP Location: Right Arm)   Pulse 98   Temp 98.8 F (37.1 C) (Oral)   Resp (!) 22   SpO2 94%  No data found.   Physical Exam  Constitutional: He is oriented to person, place, and time. He appears well-developed and well-nourished. No distress.  HENT:  Head: Normocephalic and atraumatic.  Right Ear: Tympanic membrane, external ear and ear canal normal. Tympanic membrane is not erythematous and not bulging.  Left Ear: Tympanic membrane, external ear and ear canal normal. Tympanic membrane is not erythematous and not bulging.  Nose: Nose normal. Right sinus exhibits no maxillary sinus tenderness and no frontal sinus tenderness. Left sinus exhibits no maxillary sinus tenderness and no frontal sinus tenderness.  Mouth/Throat: Uvula is midline, oropharynx is clear and moist and mucous membranes are normal.  Blisters on the lips.   Eyes: Pupils are equal, round, and reactive to light. Conjunctivae are normal.  Neck: Normal range of motion. Neck supple.  Cardiovascular: Normal rate, regular rhythm and  normal heart sounds.  Exam reveals no gallop and no friction rub.   No murmur heard. Pulmonary/Chest: Effort normal.  Crackles heard at the left lower quadrant. Otherwise, no wheezes, decreased breath sounds.   Lymphadenopathy:    He has no cervical adenopathy.  Neurological: He is alert and oriented to person, place, and time.  Skin: Skin is warm and dry.  Psychiatric: He has a normal mood and affect. His behavior is normal. Judgment normal.    Urgent Care Course     Procedures (including critical care time)  Labs Review Labs Reviewed  CULTURE, GROUP A STREP [facility])  POCT RAPID STREP A    Imaging Review Dg Chest 2 View  Result Date: 01/22/2017 CLINICAL DATA:  Productive cough for 1 week, fever, history asthma, bronchitis, pneumonia, hypertension, pericarditis EXAM: CHEST  2 VIEW COMPARISON:  01/15/2017 FINDINGS: Normal heart size, mediastinal contours, and pulmonary vascularity. BILATERAL nipple shadows again identified. Lungs clear. No acute infiltrate, pleural effusion, or pneumothorax. Bones unremarkable. IMPRESSION: No acute abnormalities. Electronically Signed   By: Lavonia Dana M.D.   On: 01/22/2017 17:23       MDM   1. Bronchitis    Discussed lab results and imaging results with patient, negative strep. Chest x-ray negative for pneumonia. On review of workup done at the ED, lab work unremarkable. Patient's last viral load was undetectable. Comparing CXR from last week, increased markings of the right lower quadrant. Given patient's history and exam, will start Levaquin. Patient requesting prednisone taper and tussionex as it had helped him during his CAP 4 months ago. Discussed risks and benefits of both medication. Patient would like to proceed with treatment. Start prednisone as directed. Start Tussionex as directed. Lidocaine solution for sore throat. Patient to continue tylenol/motrin for fever and pain. Patient to monitor for worsening of symptoms, to go to the ED for  further evaluation.    Ok Edwards, PA-C 01/22/17 2043

## 2017-01-22 NOTE — ED Triage Notes (Signed)
Pt  Has   Symptoms  Of  sorethroat    /  Body   Aches     Cough   With    Sinus   Drainage   And congestion        Seen  [facility]  At  That  Time   And  Was  Given  steriod   Shot         Pt  Was  Given     Anti  Pyretics    He  Also  Has  Sores  In  His  Mouth

## 2017-01-25 LAB — CULTURE, GROUP A STREP (THRC)

## 2017-02-07 ENCOUNTER — Ambulatory Visit
Admission: RE | Admit: 2017-02-07 | Discharge: 2017-02-07 | Disposition: A | Discharge status: Home / Self Care | Payer: Medicare Other | Source: Ambulatory Visit | Attending: Cardiovascular Disease | Admitting: Cardiovascular Disease

## 2017-02-07 ENCOUNTER — Other Ambulatory Visit: Payer: Self-pay | Admitting: Cardiovascular Disease

## 2017-02-07 DIAGNOSIS — R05 Cough: Secondary | ICD-10-CM

## 2017-02-07 DIAGNOSIS — J208 Acute bronchitis due to other specified organisms: Secondary | ICD-10-CM | POA: Diagnosis not present

## 2017-02-07 DIAGNOSIS — R053 Chronic cough: Secondary | ICD-10-CM

## 2017-02-07 DIAGNOSIS — I2510 Atherosclerotic heart disease of native coronary artery without angina pectoris: Secondary | ICD-10-CM | POA: Diagnosis not present

## 2017-02-12 ENCOUNTER — Telehealth: Payer: Self-pay | Admitting: Internal Medicine

## 2017-02-12 NOTE — Telephone Encounter (Signed)
No charge. 

## 2017-02-14 ENCOUNTER — Encounter: Payer: Medicare Other | Admitting: Internal Medicine

## 2017-02-16 ENCOUNTER — Other Ambulatory Visit: Payer: Self-pay | Admitting: Infectious Diseases

## 2017-02-16 DIAGNOSIS — J302 Other seasonal allergic rhinitis: Secondary | ICD-10-CM

## 2017-03-10 ENCOUNTER — Other Ambulatory Visit: Payer: Self-pay | Admitting: Infectious Diseases

## 2017-03-10 DIAGNOSIS — B20 Human immunodeficiency virus [HIV] disease: Secondary | ICD-10-CM

## 2017-03-12 ENCOUNTER — Ambulatory Visit: Payer: Medicare Other | Admitting: Nurse Practitioner

## 2017-03-17 ENCOUNTER — Other Ambulatory Visit: Payer: Self-pay | Admitting: Internal Medicine

## 2017-03-21 ENCOUNTER — Other Ambulatory Visit: Payer: Self-pay | Admitting: Neurology

## 2017-04-05 ENCOUNTER — Encounter: Payer: Medicare Other | Admitting: Internal Medicine

## 2017-04-09 ENCOUNTER — Other Ambulatory Visit: Payer: Self-pay | Admitting: Neurology

## 2017-04-09 ENCOUNTER — Telehealth: Payer: Self-pay | Admitting: Neurology

## 2017-04-09 DIAGNOSIS — G250 Essential tremor: Secondary | ICD-10-CM

## 2017-04-09 DIAGNOSIS — R252 Cramp and spasm: Secondary | ICD-10-CM

## 2017-04-09 DIAGNOSIS — G63 Polyneuropathy in diseases classified elsewhere: Principal | ICD-10-CM

## 2017-04-09 DIAGNOSIS — G44229 Chronic tension-type headache, not intractable: Secondary | ICD-10-CM

## 2017-04-09 DIAGNOSIS — B20 Human immunodeficiency virus [HIV] disease: Secondary | ICD-10-CM

## 2017-04-09 MED ORDER — ALPRAZOLAM 0.5 MG PO TABS: ORAL_TABLET | ORAL | 0 refills | Status: DC

## 2017-04-09 NOTE — Telephone Encounter (Signed)
Called patient sister because it looked like it was discussed in may that Dr Dohmeier recommended the patient have his PCP follow up and consider prescribing the medication. The patient sister said that she did and that the PCP said they would rather Dr Brett Fairy continue it if she wishes. Pt says he uses for sleep but that it has been beneficial for headaches as well. Dr. Brett Fairy just doesn't want the patient taking nightly, which it appears the hasn't. For this reason she is ok with approving the medication. The patient needs a follow up apt to discuss this further for treatment

## 2017-04-09 NOTE — Telephone Encounter (Signed)
Patients sister April Bland Span (Listed on DPR/POA) called office requesting refill for ALPRAZolam Duanne Moron) 0.5 MG tablet.  April is requesting to pick up the prescription.

## 2017-04-14 ENCOUNTER — Other Ambulatory Visit: Payer: Self-pay | Admitting: Neurology

## 2017-04-14 DIAGNOSIS — B20 Human immunodeficiency virus [HIV] disease: Secondary | ICD-10-CM

## 2017-04-14 DIAGNOSIS — G250 Essential tremor: Secondary | ICD-10-CM

## 2017-04-14 DIAGNOSIS — R252 Cramp and spasm: Secondary | ICD-10-CM

## 2017-04-14 DIAGNOSIS — G44229 Chronic tension-type headache, not intractable: Secondary | ICD-10-CM

## 2017-04-14 DIAGNOSIS — G63 Polyneuropathy in diseases classified elsewhere: Principal | ICD-10-CM

## 2017-04-26 ENCOUNTER — Encounter (HOSPITAL_COMMUNITY): Payer: Self-pay | Admitting: *Deleted

## 2017-04-26 ENCOUNTER — Ambulatory Visit
Admission: RE | Admit: 2017-04-26 | Discharge: 2017-04-26 | Disposition: A | Discharge status: Home / Self Care | Payer: Medicare Other | Source: Ambulatory Visit | Attending: Cardiovascular Disease | Admitting: Cardiovascular Disease

## 2017-04-26 ENCOUNTER — Other Ambulatory Visit: Payer: Self-pay | Admitting: Cardiovascular Disease

## 2017-04-26 ENCOUNTER — Inpatient Hospital Stay (HOSPITAL_COMMUNITY)
Admission: AD | Admit: 2017-04-26 | Discharge: 2017-05-02 | DRG: 975 | Disposition: A | Discharge status: Home / Self Care | Payer: Medicare Other | Source: Ambulatory Visit | Attending: Cardiovascular Disease | Admitting: Cardiovascular Disease

## 2017-04-26 DIAGNOSIS — E6609 Other obesity due to excess calories: Secondary | ICD-10-CM | POA: Diagnosis not present

## 2017-04-26 DIAGNOSIS — R0602 Shortness of breath: Secondary | ICD-10-CM | POA: Diagnosis not present

## 2017-04-26 DIAGNOSIS — K219 Gastro-esophageal reflux disease without esophagitis: Secondary | ICD-10-CM | POA: Diagnosis present

## 2017-04-26 DIAGNOSIS — Z833 Family history of diabetes mellitus: Secondary | ICD-10-CM

## 2017-04-26 DIAGNOSIS — Z87891 Personal history of nicotine dependence: Secondary | ICD-10-CM | POA: Diagnosis not present

## 2017-04-26 DIAGNOSIS — Z8042 Family history of malignant neoplasm of prostate: Secondary | ICD-10-CM | POA: Diagnosis not present

## 2017-04-26 DIAGNOSIS — J449 Chronic obstructive pulmonary disease, unspecified: Secondary | ICD-10-CM | POA: Diagnosis not present

## 2017-04-26 DIAGNOSIS — R0789 Other chest pain: Secondary | ICD-10-CM | POA: Diagnosis not present

## 2017-04-26 DIAGNOSIS — R42 Dizziness and giddiness: Secondary | ICD-10-CM | POA: Diagnosis not present

## 2017-04-26 DIAGNOSIS — B20 Human immunodeficiency virus [HIV] disease: Secondary | ICD-10-CM | POA: Diagnosis present

## 2017-04-26 DIAGNOSIS — Z8546 Personal history of malignant neoplasm of prostate: Secondary | ICD-10-CM

## 2017-04-26 DIAGNOSIS — Z8249 Family history of ischemic heart disease and other diseases of the circulatory system: Secondary | ICD-10-CM | POA: Diagnosis not present

## 2017-04-26 DIAGNOSIS — Z808 Family history of malignant neoplasm of other organs or systems: Secondary | ICD-10-CM

## 2017-04-26 DIAGNOSIS — J40 Bronchitis, not specified as acute or chronic: Secondary | ICD-10-CM

## 2017-04-26 DIAGNOSIS — J189 Pneumonia, unspecified organism: Secondary | ICD-10-CM | POA: Diagnosis not present

## 2017-04-26 DIAGNOSIS — J181 Lobar pneumonia, unspecified organism: Secondary | ICD-10-CM

## 2017-04-26 DIAGNOSIS — J168 Pneumonia due to other specified infectious organisms: Secondary | ICD-10-CM | POA: Diagnosis not present

## 2017-04-26 DIAGNOSIS — I2510 Atherosclerotic heart disease of native coronary artery without angina pectoris: Secondary | ICD-10-CM | POA: Diagnosis not present

## 2017-04-26 DIAGNOSIS — E669 Obesity, unspecified: Secondary | ICD-10-CM | POA: Diagnosis present

## 2017-04-26 DIAGNOSIS — J440 Chronic obstructive pulmonary disease with acute lower respiratory infection: Secondary | ICD-10-CM | POA: Diagnosis not present

## 2017-04-26 DIAGNOSIS — Z8614 Personal history of Methicillin resistant Staphylococcus aureus infection: Secondary | ICD-10-CM

## 2017-04-26 DIAGNOSIS — Z96651 Presence of right artificial knee joint: Secondary | ICD-10-CM | POA: Diagnosis not present

## 2017-04-26 DIAGNOSIS — Z23 Encounter for immunization: Secondary | ICD-10-CM | POA: Diagnosis not present

## 2017-04-26 DIAGNOSIS — R51 Headache: Secondary | ICD-10-CM | POA: Diagnosis not present

## 2017-04-26 DIAGNOSIS — I10 Essential (primary) hypertension: Secondary | ICD-10-CM | POA: Diagnosis present

## 2017-04-26 LAB — COMPREHENSIVE METABOLIC PANEL
ALT: 16 U/L — ABNORMAL LOW (ref 17–63)
AST: 25 U/L (ref 15–41)
Albumin: 3.9 g/dL (ref 3.5–5.0)
Alkaline Phosphatase: 99 U/L (ref 38–126)
Anion gap: 8 (ref 5–15)
BUN: 13 mg/dL (ref 6–20)
CO2: 27 mmol/L (ref 22–32)
Calcium: 9.2 mg/dL (ref 8.9–10.3)
Chloride: 101 mmol/L (ref 101–111)
Creatinine, Ser: 1.33 mg/dL — ABNORMAL HIGH (ref 0.61–1.24)
GFR calc Af Amer: >60 mL/min (ref >60)
GFR calc non Af Amer: 60 mL/min — ABNORMAL LOW (ref >60)
Glucose, Bld: 108 mg/dL — ABNORMAL HIGH (ref 65–99)
Potassium: 3.4 mmol/L — ABNORMAL LOW (ref 3.5–5.1)
Sodium: 136 mmol/L (ref 135–145)
Total Bilirubin: 1.5 mg/dL — ABNORMAL HIGH (ref 0.3–1.2)
Total Protein: 6.6 g/dL (ref 6.5–8.1)

## 2017-04-26 LAB — CBC WITH DIFFERENTIAL/PLATELET
Basophils Absolute: 0 10*3/uL (ref 0.0–0.1)
Basophils Relative: 1 %
Eosinophils Absolute: 0.3 10*3/uL (ref 0.0–0.7)
Eosinophils Relative: 5 %
HCT: 37.6 % — ABNORMAL LOW (ref 39.0–52.0)
Hemoglobin: 12.6 g/dL — ABNORMAL LOW (ref 13.0–17.0)
Lymphocytes Relative: 35 %
Lymphs Abs: 2.3 10*3/uL (ref 0.7–4.0)
MCH: 31.7 pg (ref 26.0–34.0)
MCHC: 33.5 g/dL (ref 30.0–36.0)
MCV: 94.5 fL (ref 78.0–100.0)
Monocytes Absolute: 0.6 10*3/uL (ref 0.1–1.0)
Monocytes Relative: 8 %
Neutro Abs: 3.4 10*3/uL (ref 1.7–7.7)
Neutrophils Relative %: 51 %
Platelets: 200 10*3/uL (ref 150–400)
RBC: 3.98 MIL/uL — ABNORMAL LOW (ref 4.22–5.81)
RDW: 14.5 % (ref 11.5–15.5)
WBC: 6.6 10*3/uL (ref 4.0–10.5)

## 2017-04-26 MED ORDER — LOSARTAN POTASSIUM 50 MG PO TABS
25.0000 mg | ORAL_TABLET | Freq: Every day | ORAL | Status: DC
  Administered 2017-04-27 – 2017-05-02 (×6): 25 mg via ORAL
  Filled 2017-04-26 (×6): qty 1

## 2017-04-26 MED ORDER — ALBUTEROL SULFATE (2.5 MG/3ML) 0.083% IN NEBU
2.5000 mg | INHALATION_SOLUTION | Freq: Four times a day (QID) | RESPIRATORY_TRACT | Status: DC
  Administered 2017-04-27 (×3): 2.5 mg via RESPIRATORY_TRACT
  Filled 2017-04-26 (×4): qty 3

## 2017-04-26 MED ORDER — ONDANSETRON HCL 4 MG PO TABS
4.0000 mg | ORAL_TABLET | Freq: Three times a day (TID) | ORAL | Status: DC | PRN
  Administered 2017-04-27: 4 mg via ORAL
  Filled 2017-04-26: qty 1

## 2017-04-26 MED ORDER — SODIUM CHLORIDE 0.9 % IV SOLN
INTRAVENOUS | Status: DC
Start: 2017-04-26 — End: 2017-05-02
  Administered 2017-04-26: 21:00:00 via INTRAVENOUS

## 2017-04-26 MED ORDER — GABAPENTIN 300 MG PO CAPS
300.0000 mg | ORAL_CAPSULE | Freq: Two times a day (BID) | ORAL | Status: DC
Start: 2017-04-26 — End: 2017-05-02
  Administered 2017-04-26 – 2017-05-02 (×12): 300 mg via ORAL
  Filled 2017-04-26 (×12): qty 1

## 2017-04-26 MED ORDER — METOPROLOL SUCCINATE ER 25 MG PO TB24
25.0000 mg | ORAL_TABLET | Freq: Every day | ORAL | Status: DC
  Administered 2017-04-27 – 2017-05-02 (×6): 25 mg via ORAL
  Filled 2017-04-26 (×6): qty 1

## 2017-04-26 MED ORDER — RITONAVIR 100 MG PO TABS
100.0000 mg | ORAL_TABLET | Freq: Every day | ORAL | Status: DC
  Administered 2017-04-26 – 2017-05-01 (×6): 100 mg via ORAL
  Filled 2017-04-26 (×6): qty 1

## 2017-04-26 MED ORDER — DEXTROSE 5 % IV SOLN
500.0000 mg | INTRAVENOUS | Status: DC
  Administered 2017-04-26 – 2017-04-29 (×3): 500 mg via INTRAVENOUS
  Filled 2017-04-26 (×4): qty 500

## 2017-04-26 MED ORDER — ALPRAZOLAM 0.5 MG PO TABS
0.5000 mg | ORAL_TABLET | Freq: Every day | ORAL | Status: DC
  Administered 2017-04-26 – 2017-05-01 (×5): 0.5 mg via ORAL
  Filled 2017-04-26 (×6): qty 1

## 2017-04-26 MED ORDER — INFLUENZA VAC SPLIT QUAD 0.5 ML IM SUSY
0.5000 mL | PREFILLED_SYRINGE | INTRAMUSCULAR | Status: AC
  Administered 2017-04-27: 0.5 mL via INTRAMUSCULAR
  Filled 2017-04-26: qty 0.5

## 2017-04-26 MED ORDER — ALBUTEROL SULFATE (2.5 MG/3ML) 0.083% IN NEBU
2.5000 mg | INHALATION_SOLUTION | RESPIRATORY_TRACT | Status: DC | PRN
  Administered 2017-04-28: 2.5 mg via RESPIRATORY_TRACT
  Filled 2017-04-26: qty 3

## 2017-04-26 MED ORDER — EMTRICITABINE-TENOFOVIR AF 200-25 MG PO TABS
1.0000 | ORAL_TABLET | Freq: Every day | ORAL | Status: DC
  Administered 2017-04-27 – 2017-05-02 (×6): 1 via ORAL
  Filled 2017-04-26 (×6): qty 1

## 2017-04-26 MED ORDER — HYDROCHLOROTHIAZIDE 25 MG PO TABS
25.0000 mg | ORAL_TABLET | Freq: Every day | ORAL | Status: DC
  Administered 2017-04-27 – 2017-04-30 (×4): 25 mg via ORAL
  Filled 2017-04-26 (×6): qty 1

## 2017-04-26 MED ORDER — RALTEGRAVIR POTASSIUM 400 MG PO TABS
800.0000 mg | ORAL_TABLET | Freq: Every day | ORAL | Status: DC
  Administered 2017-04-26 – 2017-05-01 (×6): 800 mg via ORAL
  Filled 2017-04-26 (×7): qty 2

## 2017-04-26 MED ORDER — HEPARIN SODIUM (PORCINE) 5000 UNIT/ML IJ SOLN
5000.0000 [IU] | Freq: Three times a day (TID) | INTRAMUSCULAR | Status: DC
  Administered 2017-04-26 – 2017-05-02 (×17): 5000 [IU] via SUBCUTANEOUS
  Filled 2017-04-26 (×17): qty 1

## 2017-04-26 MED ORDER — ASPIRIN EC 81 MG PO TBEC
81.0000 mg | DELAYED_RELEASE_TABLET | Freq: Every day | ORAL | Status: DC
  Administered 2017-04-27 – 2017-05-02 (×6): 81 mg via ORAL
  Filled 2017-04-26 (×6): qty 1

## 2017-04-26 MED ORDER — PANTOPRAZOLE SODIUM 40 MG PO TBEC
40.0000 mg | DELAYED_RELEASE_TABLET | Freq: Every day | ORAL | Status: DC
  Administered 2017-04-27 – 2017-05-02 (×6): 40 mg via ORAL
  Filled 2017-04-26 (×6): qty 1

## 2017-04-26 NOTE — H&P (Signed)
Referring Physician:  [name] is an 56 y.o. male.                       Chief Complaint: Cough and shortness of breath.  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 No fever. Chest x-ray is positive for right lung pneumonia.  Past Medical History:  Diagnosis Date  . Arthritis    r knee   . Asthma    very rare  . Axillary lymphadenopathy   . Bronchitis   . Chronic anemia    normocytic  . Chronic folliculitis   . Elevated PSA   . Environmental and seasonal allergies   . GERD (gastroesophageal reflux disease)   . Gross hematuria   . H/O pericarditis    2010--  myopercarditis--  resolved  . HCAP (healthcare-associated pneumonia) 11/19/2014  . Headache(784.0)    HX SEVERE FRONTAL HA'S  . Hiatal hernia   . History of concussion    2012  &  2013  RESIDUAL HA'S --  RESOLVED  . History of gastric ulcer   . History of kidney stones   . History of MRSA infection 2010   infected boil  . HIV (human immunodeficiency virus infection) ([place]) 1988  . Hypertension   . Internal hemorrhoids   . Lytic bone lesion of hip    WORK-UP DONE BY ONCOLOGIST DR HA --  NOT MALIGNANT  . Pneumonia    hx of  . Post concussion syndrome    resolved  . Prostate cancer ([place]) 04/25/13   gleason 3+3=6, volume 30 gm  . Sinusitis, chronic 05/14/2015  . Ulcer    hx of gastric  . Wears glasses       Past Surgical History:  Procedure Laterality Date  . CARDIAC CATHETERIZATION  03-23-2009  DR COOPER   NORMAL CORONARY ARTERIES  . CHOLECYSTECTOMY N/A 11/06/2014   Procedure: LAPAROSCOPIC CHOLECYSTECTOMY WITH INTRAOPERATIVE CHOLANGIOGRAM;  Surgeon: Judeth Horn, MD;  Location: [place];  Service: General;  Laterality: N/A;  . COLONOSCOPY  12/26/2011   Procedure: COLONOSCOPY;  Surgeon: Lear Ng, MD;  Location: [facility];  Service: Endoscopy;  Laterality: N/A;  .  COLONOSCOPY WITH PROPOFOL N/A 07/29/2014   Procedure: COLONOSCOPY WITH PROPOFOL;  Surgeon: Lear Ng, MD;  Location: [place];  Service: Endoscopy;  Laterality: N/A;  . CYSTOSCOPY/RETROGRADE/URETEROSCOPY Bilateral 04/25/2013   Procedure: CYSTOSCOPY/ BILATERAL RETROGRADES; BLADDER BIOPSIES;  Surgeon: Alexis Frock, MD;  Location: [facility];  Service: Urology;  Laterality: Bilateral;  . DENTAL EXAMINATION UNDER ANESTHESIA    . ESOPHAGOGASTRODUODENOSCOPY  12/26/2011   Procedure: ESOPHAGOGASTRODUODENOSCOPY (EGD);  Surgeon: Lear Ng, MD;  Location: [facility];  Service: Endoscopy;  Laterality: N/A;  . ESOPHAGOGASTRODUODENOSCOPY (EGD) WITH PROPOFOL N/A 07/29/2014   Procedure: ESOPHAGOGASTRODUODENOSCOPY (EGD) WITH PROPOFOL;  Surgeon: Lear Ng, MD;  Location: [place];  Service: Endoscopy;  Laterality: N/A;  . EXCISION CHRONIC LEFT BREAST ABSCESS  09-21-2010  . EXCISIONAL BX LEFT BREAST MASS/  I  &  D LEFT BREAST ABSCESS  03-24-2009  . KNEE ARTHROSCOPY Right 1985  . left axilla biopsy  04/2013  . left hip biopsy  10/2012  . LYMPHADENECTOMY Bilateral 08/18/2013   Procedure: LYMPHADENECTOMY "PELVIC LYMPH NODE DISSECTION";  Surgeon: Alexis Frock, MD;  Location: [facility];  Service: Urology;  Laterality: Bilateral;  .  PROSTATE BIOPSY N/A 04/25/2013   Procedure: BIOPSY TRANSRECTAL ULTRASONIC PROSTATE (TUBP);  Surgeon: Alexis Frock, MD;  Location: [facility];  Service: Urology;  Laterality: N/A;  . ROBOT ASSISTED LAPAROSCOPIC RADICAL PROSTATECTOMY N/A 08/18/2013   Procedure: ROBOTIC ASSISTED LAPAROSCOPIC RADICAL PROSTATECTOMY;  Surgeon: Alexis Frock, MD;  Location: [facility];  Service: Urology;  Laterality: N/A;  . TOTAL KNEE ARTHROPLASTY Right 08/01/2016  . TOTAL KNEE ARTHROPLASTY Right 08/01/2016   Procedure: TOTAL KNEE ARTHROPLASTY;  Surgeon: Renette Butters, MD;  Location: [place];  Service: Orthopedics;  Laterality: Right;  . UPPER LEG  SOFT TISSUE BIOPSY Left 2012   lthigh    Family History  Problem Relation Age of Onset  . Stroke Father   . Diabetes Father   . Cancer Father        brain cancer  . Asthma Father   . Hypertension Sister   . Cancer Maternal Uncle        prostate cancer  . Asthma Sister    Social History:  reports that he quit smoking about 6 years ago. His smoking use included Cigars and Cigarettes. He has a 34.50 pack-year smoking history. He has never used smokeless tobacco. He reports that he does not drink alcohol or use drugs.  Allergies:  Allergies  Allergen Reactions  . Chocolate Shortness Of Breath and Other (See Comments)    Asthma attack, welts  . Cocoa Shortness Of Breath and Other (See Comments)    Other reaction(s): Other (See Comments) Asthma attack, welts  . Morphine And Related Shortness Of Breath, Itching and Other (See Comments)     chest pain  . Penicillins Shortness Of Breath and Rash    Has patient had a PCN reaction causing immediate rash, facial/tongue/throat swelling, SOB or lightheadedness with hypotension: Yes Has patient had a PCN reaction causing severe rash involving mucus membranes or skin necrosis: No Has patient had a PCN reaction that required hospitalization No Has patient had a PCN reaction occurring within the last 10 years: Yes If all of the above answers are "NO", then may proceed with Cephalosporin use.  . Sulfonamide Derivatives Shortness Of Breath and Rash  . Tomato Shortness Of Breath  . Vancomycin Shortness Of Breath  . Flagyl [Metronidazole] Itching  . Sulfa Antibiotics   . Tramadol Itching and Nausea And Vomiting    Medications Prior to Admission  Medication Sig Dispense Refill  . albuterol (PROVENTIL HFA;VENTOLIN HFA) 108 (90 Base) MCG/ACT inhaler Inhale 2 puffs into the lungs every 4 (four) hours as needed for wheezing or shortness of breath. 2 Inhaler 3  . albuterol (PROVENTIL) (2.5 MG/3ML) 0.083% nebulizer solution Take 3 mLs (2.5 mg total)  by nebulization every 6 (six) hours as needed for wheezing or shortness of breath. 75 mL 12  . ALPRAZolam (XANAX) 0.5 MG tablet TAKE 0.5 MG BY MOUTH AT BEDTIME AS NEEDED FOR ANXIETY 30 tablet 0  . aspirin EC 81 MG tablet Take 81 mg by mouth daily after breakfast.     . Beclomethasone Dipropionate 80 MCG/ACT AERS Place 2 sprays into the nose once. (Patient taking differently: Place 2 sprays into both nostrils 2 (two) times daily. ) 1 Inhaler 2  . benzonatate (TESSALON) 100 MG capsule Take 1 capsule (100 mg total) by mouth every 8 (eight) hours. 21 capsule 0  . darunavir (PREZISTA) 800 MG tablet TAKE 1 TABLET(800 MG) BY MOUTH DAILY (Patient taking differently: Take 800 mg by mouth at bedtime. ) 30 tablet 5  .  gabapentin (NEURONTIN) 300 MG capsule TAKE 1 CAPSULE(300 MG) BY MOUTH FOUR TIMES DAILY AS NEEDED FOR NERVE PAIN 120 capsule 11  . hydrochlorothiazide (HYDRODIURIL) 25 MG tablet Take 1 tablet (25 mg total) by mouth daily. (Patient taking differently: Take 25 mg by mouth daily after breakfast. ) 30 tablet 0  . hydrOXYzine (ATARAX/VISTARIL) 25 MG tablet TAKE 1 TABLET(25 MG) BY MOUTH EVERY NIGHT 30 tablet 0  . ipratropium (ATROVENT) 0.02 % nebulizer solution USE 2.5 ML(0.5 MG) VIA NEBULIZER THREE TIMES DAILY AS NEEDED FOR WHEEZING OR SHORTNESS OF BREATH 1650 mL 1  . ISENTRESS 400 MG tablet Take 800 mg by mouth at bedtime.  5  . levofloxacin (LEVAQUIN) 500 MG tablet Take 1 tablet (500 mg total) by mouth daily. 10 tablet 0  . lidocaine (XYLOCAINE) 2 % solution Use as directed 10 mLs in the mouth or throat as needed for mouth pain. 100 mL 0  . losartan (COZAAR) 25 MG tablet Take 1 tablet (25 mg total) by mouth daily. (Patient taking differently: Take 25 mg by mouth daily after breakfast. ) 30 tablet 11  . metoprolol succinate (TOPROL-XL) 25 MG 24 hr tablet Take 25 mg by mouth daily after breakfast.    . mometasone-formoterol (DULERA) 200-5 MCG/ACT AERO Take 2 puffs first thing in am and then another 2  puffs about 12 hours later. (Patient taking differently: Inhale 1 puff into the lungs every 12 (twelve) hours. ) 1 Inhaler 11  . montelukast (SINGULAIR) 10 MG tablet TAKE 1 TABLET(10 MG) BY MOUTH AT BEDTIME 30 tablet 2  . Multiple Vitamin (MULTIVITAMIN WITH MINERALS) TABS tablet Take 1 tablet by mouth daily after breakfast.     . mupirocin ointment (BACTROBAN) 2 % APPLY TO THE AFFECTED AREA TWICE DAILY FOR 2 WEEKS, PLACE COTTON BETWEEN TOES TO ALLOW BETTER AERATION AS NEEDED 66 g 2  . NORVIR 100 MG TABS tablet Take 100 mg by mouth at bedtime.    [name] omeprazole (PRILOSEC) 40 MG capsule Take 1 capsule (40 mg total) by mouth every morning. 30 capsule 2  . ondansetron (ZOFRAN) 4 MG tablet TAKE 1 TABLET BY MOUTH EVERY 8 HOURS AS NEEDED FOR NAUSEA 40 tablet 2  . Polyethyl Glycol-Propyl Glycol (SYSTANE) 0.4-0.3 % GEL ophthalmic gel Place 1 application into both eyes 2 (two) times daily.     . predniSONE (STERAPRED UNI-PAK 21 TAB) 10 MG (21) TBPK tablet Take by mouth daily. Take 6 tabs by mouth day 1, then 5 tabs, then 4 tabs, then 3 tabs, 2 tabs, then 1 tab for the last day 21 tablet 0  . SUMAtriptan (IMITREX) 100 MG tablet Take 1 tablet (100 mg total) by mouth every 2 (two) hours as needed for migraine. May repeat in 2 hours if headache persists or recurs. 10 tablet 0  . tizanidine (ZANAFLEX) 6 MG capsule TAKE 1 CAPSULE BY MOUTH THREE TIMES DAILY AS NEEDED FOR MUSCLE SPASMS 270 capsule 0  . torsemide (DEMADEX) 20 MG tablet Take 1 tablet (20 mg total) by mouth daily. (Patient taking differently: Take 20 mg by mouth daily after breakfast. ) 30 tablet 11  . TRUVADA 200-300 MG tablet TAKE 1 TABLET BY MOUTH DAILY. TAKE WITH PREZISTA, NORVIR AND ISENTRESS DAILY. 30 tablet 1  . VOLTAREN 1 % GEL APPLY 2 GRAMS TOPICALLY TO THE AFFECTED AREA TWICE DAILY AS NEEDED FOR PAIN 100 g 0  . zolpidem (AMBIEN) 5 MG tablet Take 1-2 tablets (5-10 mg total) by mouth at bedtime as needed for sleep.  30 tablet 2    No results found  for this or any previous visit (from the past 48 hour(s)). Dg Chest 2 View  Result Date: 04/26/2017 CLINICAL DATA:  Chest tightness and shortness breath for 1 week. EXAM: CHEST  2 VIEW COMPARISON:  02/07/2017. FINDINGS: The heart size and mediastinal contours are within normal limits. There are faint increased markings at the RIGHT base, which could represent developing infiltrate in the RIGHT middle and/or RIGHT lower lobe. This represents an interval change from priors. LEFT lung is clear. There is no bony abnormality. IMPRESSION: Early RIGHT base infiltrate is new from August 2018. Correlate clinically for pneumonia. Follow-up radiograph recommended 4-6 weeks to ensure clearing. Electronically Signed   By: Staci Righter M.D.   On: 04/26/2017 16:06    Review Of Systems Constitutional: No fever, chills, weight loss or gain. Eyes: No vision change, wears glasses. No discharge or pain. Ears: No hearing loss, No tinnitus. Respiratory: Positive asthma, COPD, pneumonias, shortness of breath. No hemoptysis. Cardiovascular: Positive chest pain, palpitation, leg edema. Gastrointestinal: No nausea, vomiting, diarrhea, constipation. No GI bleed. No hepatitis. Genitourinary: No dysuria, hematuria, kidney stone. No incontinance. Neurological: Positive headache, no stroke, seizures.  Psychiatry: No psych facility admission for anxiety, depression, suicide. No detox. Skin: No rash. Musculoskeletal: Positive joint pain, no fibromyalgia. No neck pain, back pain. Lymphadenopathy: No lymphadenopathy. Hematology: No anemia or easy bruising.   There were no vitals taken for this visit. There is no height or weight on file to calculate BMI. General appearance: alert, cooperative, appears stated age and mild respiratory distress Head: Normocephalic, atraumatic. Eyes: Brown eyes, pink conjunctiva, corneas clear. PERRL, EOM's intact. Neck: No adenopathy, no carotid bruit, no JVD, supple, symmetrical, trachea  midline and thyroid not enlarged. Resp: Mild rhonchi in both lower lobes to auscultation. Cardio: Regular rate and rhythm, S1, S2 normal, II/VI systolic murmur, no click, rub or gallop GI: Soft, non-tender; bowel sounds normal; no organomegaly. Extremities: Trace edema, cyanosis or clubbing. Skin: Warm and dry.  Neurologic: Alert and oriented X 3, normal strength. Normal coordination and gait.  Assessment/Plan Acute right lung pneumonia COPD Hypertension Obesity HIV infection Prostate cancer  Admit. IV antibiotic. Home medications.  Birdie Riddle, MD  04/26/2017, 8:44 PM

## 2017-04-27 ENCOUNTER — Ambulatory Visit: Payer: Medicare Other | Admitting: Infectious Diseases

## 2017-04-27 LAB — BASIC METABOLIC PANEL
Anion gap: 7 (ref 5–15)
BUN: 12 mg/dL (ref 6–20)
CO2: 26 mmol/L (ref 22–32)
Calcium: 8.8 mg/dL — ABNORMAL LOW (ref 8.9–10.3)
Chloride: 104 mmol/L (ref 101–111)
Creatinine, Ser: 1.17 mg/dL (ref 0.61–1.24)
GFR calc Af Amer: >60 mL/min (ref >60)
GFR calc non Af Amer: >60 mL/min (ref >60)
Glucose, Bld: 95 mg/dL (ref 65–99)
Potassium: 3.6 mmol/L (ref 3.5–5.1)
Sodium: 137 mmol/L (ref 135–145)

## 2017-04-27 LAB — CBC
HCT: 36 % — ABNORMAL LOW (ref 39.0–52.0)
Hemoglobin: 11.9 g/dL — ABNORMAL LOW (ref 13.0–17.0)
MCH: 31.7 pg (ref 26.0–34.0)
MCHC: 33.1 g/dL (ref 30.0–36.0)
MCV: 96 fL (ref 78.0–100.0)
Platelets: 172 10*3/uL (ref 150–400)
RBC: 3.75 MIL/uL — ABNORMAL LOW (ref 4.22–5.81)
RDW: 14.5 % (ref 11.5–15.5)
WBC: 5.4 10*3/uL (ref 4.0–10.5)

## 2017-04-27 LAB — MRSA PCR SCREENING: MRSA by PCR: NEGATIVE

## 2017-04-27 MED ORDER — SUMATRIPTAN SUCCINATE 100 MG PO TABS
100.0000 mg | ORAL_TABLET | ORAL | Status: DC | PRN
  Administered 2017-04-27 – 2017-04-30 (×2): 100 mg via ORAL
  Filled 2017-04-27 (×3): qty 1

## 2017-04-27 MED ORDER — ACETAMINOPHEN 325 MG PO TABS
650.0000 mg | ORAL_TABLET | Freq: Four times a day (QID) | ORAL | Status: DC | PRN
  Administered 2017-04-27 – 2017-05-02 (×5): 650 mg via ORAL
  Filled 2017-04-27 (×5): qty 2

## 2017-04-27 MED ORDER — ALBUTEROL SULFATE (2.5 MG/3ML) 0.083% IN NEBU
2.5000 mg | INHALATION_SOLUTION | Freq: Three times a day (TID) | RESPIRATORY_TRACT | Status: DC
  Administered 2017-04-28: 2.5 mg via RESPIRATORY_TRACT
  Filled 2017-04-27: qty 3

## 2017-04-27 MED ORDER — POTASSIUM CHLORIDE CRYS ER 10 MEQ PO TBCR
10.0000 meq | EXTENDED_RELEASE_TABLET | Freq: Every day | ORAL | Status: DC
  Administered 2017-04-27 – 2017-05-02 (×6): 10 meq via ORAL
  Filled 2017-04-27 (×10): qty 1

## 2017-04-27 MED ORDER — OXYCODONE HCL 5 MG PO TABS
5.0000 mg | ORAL_TABLET | Freq: Once | ORAL | Status: AC
  Administered 2017-04-27: 5 mg via ORAL
  Filled 2017-04-27: qty 1

## 2017-04-27 NOTE — Progress Notes (Signed)
Ref: Campbell Riches, MD   Subjective:  Feeling same. VS stable.  Objective:  Vital Signs in the last 24 hours: Temp:  [97.9 F (36.6 C)-98.1 F (36.7 C)] 97.9 F (36.6 C) (10/26 0453) Pulse Rate:  [73-78] 73 (10/26 0453) Cardiac Rhythm: Normal sinus rhythm (10/26 0701) Resp:  [17-20] 17 (10/26 0453) BP: (108-123)/(77-80) 108/77 (10/26 0453) SpO2:  [93 %-94 %] 93 % (10/26 0802) Weight:  [98.3 kg (216 lb 11.4 oz)-98.8 kg (217 lb 12.8 oz)] 98.8 kg (217 lb 12.8 oz) (10/26 0500)  Physical Exam: BP Readings from Last 1 Encounters:  04/27/17 108/77    Wt Readings from Last 1 Encounters:  04/27/17 98.8 kg (217 lb 12.8 oz)    Weight change:  Body mass index is 32.16 kg/m. HEENT: [place]/AT, Eyes-Brown, PERL, EOMI, Conjunctiva-Pink, Sclera-Non-icteric Neck: No JVD, No bruit, Trachea midline. Lungs:  Clear, Bilateral. Cardiac:  Regular rhythm, normal S1 and S2, no S3. II/VI systolic murmur. Abdomen:  Soft, non-tender. BS present. Extremities:  No edema present. No cyanosis. No clubbing. CNS: AxOx3, Cranial nerves grossly intact, moves all 4 extremities.  Skin: Warm and dry.   Intake/Output from previous day: 10/25 0701 - 10/26 0700 In: 920 [P.O.:240; I.V.:430; IV Piggyback:250] Out: 395 [Urine:395]    Lab Results: BMET    Component Value Date/Time   NA 137 04/27/2017 0650   NA 136 04/26/2017 2053   NA 139 01/16/2017 0051   NA 142 02/24/2013 0848   K 3.6 04/27/2017 0650   K 3.4 (L) 04/26/2017 2053   K 3.3 (L) 01/16/2017 0051   K 3.4 (L) 02/24/2013 0848   CL 104 04/27/2017 0650   CL 101 04/26/2017 2053   CL 103 01/16/2017 0051   CO2 26 04/27/2017 0650   CO2 27 04/26/2017 2053   CO2 25 01/16/2017 0051   CO2 24 02/24/2013 0848   GLUCOSE 95 04/27/2017 0650   GLUCOSE 108 (H) 04/26/2017 2053   GLUCOSE 115 (H) 01/16/2017 0051   GLUCOSE 121 02/24/2013 0848   GLUCOSE 93 03/25/2009   GLUCOSE 99 03/17/2001   BUN 12 04/27/2017 0650   BUN 13 04/26/2017 2053   BUN 15  01/16/2017 0051   BUN 11.9 02/24/2013 0848   CREATININE 1.17 04/27/2017 0650   CREATININE 1.33 (H) 04/26/2017 2053   CREATININE 1.46 (H) 01/16/2017 0051   CREATININE 1.38 (H) 04/20/2016 1428   CREATININE 1.26 07/27/2015 1652   CREATININE 1.09 05/11/2015 1012   CREATININE 1.3 02/24/2013 0848   CALCIUM 8.8 (L) 04/27/2017 0650   CALCIUM 9.2 04/26/2017 2053   CALCIUM 9.4 01/16/2017 0051   CALCIUM 8.9 02/24/2013 0848   GFRNONAA >60 04/27/2017 0650   GFRNONAA 60 (L) 04/26/2017 2053   GFRNONAA 53 (L) 01/16/2017 0051   GFRNONAA 58 (L) 04/20/2016 1428   GFRNONAA 65 07/27/2015 1652   GFRNONAA 81 10/13/2013 1155   GFRAA >60 04/27/2017 0650   GFRAA >60 04/26/2017 2053   GFRAA >60 01/16/2017 0051   GFRAA 67 04/20/2016 1428   GFRAA 75 07/27/2015 1652   GFRAA >89 10/13/2013 1155   CBC    Component Value Date/Time   WBC 5.4 04/27/2017 0650   RBC 3.75 (L) 04/27/2017 0650   HGB 11.9 (L) 04/27/2017 0650   HGB 14.0 05/11/2015 1108   HCT 36.0 (L) 04/27/2017 0650   HCT 42.1 05/11/2015 1108   PLT 172 04/27/2017 0650   PLT 126 (L) 05/11/2015 1108   MCV 96.0 04/27/2017 0650   MCV 97.5 05/11/2015 1108  MCH 31.7 04/27/2017 0650   MCHC 33.1 04/27/2017 0650   RDW 14.5 04/27/2017 0650   RDW 12.9 05/11/2015 1108   LYMPHSABS 2.3 04/26/2017 2053   LYMPHSABS 1.5 05/11/2015 1108   MONOABS 0.6 04/26/2017 2053   MONOABS 0.3 05/11/2015 1108   EOSABS 0.3 04/26/2017 2053   EOSABS 0.6 (H) 05/11/2015 1108   BASOSABS 0.0 04/26/2017 2053   BASOSABS 0.0 05/11/2015 1108   HEPATIC Function Panel  Recent Labs  09/25/16 0754 01/16/17 0051 04/26/17 2053  PROT 6.7 7.8 6.6   HEMOGLOBIN A1C No components found for: HGA1C,  MPG CARDIAC ENZYMES Lab Results  Component Value Date   CKTOTAL 128 10/10/2010   CKMB 1.7 10/10/2010   TROPONINI <0.03 09/25/2016   TROPONINI <0.03 11/19/2014   TROPONINI <0.30 02/02/2014   BNP No results for input(s): PROBNP in the last 8760 hours. TSH No results for  input(s): TSH in the last 8760 hours. CHOLESTEROL No results for input(s): CHOL in the last 8760 hours.  Scheduled Meds: . albuterol  2.5 mg Nebulization Q6H  . ALPRAZolam  0.5 mg Oral QHS  . aspirin EC  81 mg Oral QPC breakfast  . emtricitabine-tenofovir AF  1 tablet Oral Daily  . gabapentin  300 mg Oral BID  . heparin  5,000 Units Subcutaneous Q8H  . hydrochlorothiazide  25 mg Oral QPC breakfast  . losartan  25 mg Oral QPC breakfast  . metoprolol succinate  25 mg Oral QPC breakfast  . pantoprazole  40 mg Oral Daily  . potassium chloride  10 mEq Oral Daily  . raltegravir  800 mg Oral QHS  . ritonavir  100 mg Oral QHS   Continuous Infusions: . sodium chloride 50 mL/hr at 04/26/17 2124  . azithromycin Stopped (04/27/17 0019)   PRN Meds:.acetaminophen, albuterol, ondansetron  Assessment/Plan: Acute right lung pneumonia COPD Hypertension Obesity HIV infection Status post prostate cancer  Continue IV antibiotic. Increase activity.   LOS: 1 day    Dixie Dials  MD  04/27/2017, 10:05 AM

## 2017-04-27 NOTE — Progress Notes (Signed)
Patient's headache pain worsening and unchanged with Tylenol and Imitrex. Notified Dr. Doylene Canard and received a one time order for Oxycodone 5 mg PO.

## 2017-04-28 MED ORDER — DEXTROSE 5 % IV SOLN
1.0000 g | INTRAVENOUS | Status: DC
  Administered 2017-04-28 – 2017-05-02 (×5): 1 g via INTRAVENOUS
  Filled 2017-04-28 (×5): qty 10

## 2017-04-28 MED ORDER — ALBUTEROL SULFATE (2.5 MG/3ML) 0.083% IN NEBU
2.5000 mg | INHALATION_SOLUTION | Freq: Two times a day (BID) | RESPIRATORY_TRACT | Status: DC
  Administered 2017-04-29 – 2017-05-01 (×5): 2.5 mg via RESPIRATORY_TRACT
  Filled 2017-04-28 (×6): qty 3

## 2017-04-28 NOTE — Progress Notes (Signed)
Subjective:  Patient denies any chest pain coughing and breathing has improved. Complains of occasional dizziness. No further chest pain or arm pain.  Objective:  Vital Signs in the last 24 hours: Temp:  [97.7 F (36.5 C)-98.3 F (36.8 C)] 98.2 F (36.8 C) (10/27 0536) Pulse Rate:  [66-79] 66 (10/27 0536) Resp:  [17-18] 17 (10/27 0536) BP: (106-121)/(73-93) 111/73 (10/27 0536) SpO2:  [89 %-97 %] 95 % (10/27 0810) Weight:  [99.8 kg (220 lb)] 99.8 kg (220 lb) (10/27 0536)  Intake/Output from previous day: 10/26 0701 - 10/27 0700 In: 996.2 [P.O.:360; I.V.:386.2; IV Piggyback:250] Out: 3646 [Urine:1739] Intake/Output from this shift: Total I/O In: 240 [P.O.:240] Out: 250 [Urine:250]  Physical Exam: Neck: no adenopathy, no carotid bruit, no JVD and supple, symmetrical, trachea midline Lungs: Right lung rhonchi noted Heart: regular rate and rhythm, S1, S2 normal and Soft systolic murmur noted Abdomen: soft, non-tender; bowel sounds normal; no masses,  no organomegaly Extremities: extremities normal, atraumatic, no cyanosis or edema  Lab Results:  Recent Labs  04/26/17 2053 04/27/17 0650  WBC 6.6 5.4  HGB 12.6* 11.9*  PLT 200 172    Recent Labs  04/26/17 2053 04/27/17 0650  NA 136 137  K 3.4* 3.6  CL 101 104  CO2 27 26  GLUCOSE 108* 95  BUN 13 12  CREATININE 1.33* 1.17   No results for input(s): TROPONINI in the last 72 hours.  Invalid input(s): CK, MB Hepatic Function Panel  Recent Labs  04/26/17 2053  PROT 6.6  ALBUMIN 3.9  AST 25  ALT 16*  ALKPHOS 99  BILITOT 1.5*   No results for input(s): CHOL in the last 72 hours. No results for input(s): PROTIME in the last 72 hours.  Imaging: Imaging results have been reviewed and Dg Chest 2 View  Result Date: 04/26/2017 CLINICAL DATA:  Chest tightness and shortness breath for 1 week. EXAM: CHEST  2 VIEW COMPARISON:  02/07/2017. FINDINGS: The heart size and mediastinal contours are within normal limits.  There are faint increased markings at the RIGHT base, which could represent developing infiltrate in the RIGHT middle and/or RIGHT lower lobe. This represents an interval change from priors. LEFT lung is clear. There is no bony abnormality. IMPRESSION: Early RIGHT base infiltrate is new from August 2018. Correlate clinically for pneumonia. Follow-up radiograph recommended 4-6 weeks to ensure clearing. Electronically Signed   By: Staci Righter M.D.   On: 04/26/2017 16:06    Cardiac Studies:  Assessment/Plan:  Acute right lung pneumonia COPD Hypertension Obesity HIV infection Status post prostate cancer Plan And Rocephin 1 g every 24 hours. Patient states has taken Rocephin in the past without any problems  LOS: 2 days    Charolette Forward 04/28/2017, 10:32 AM

## 2017-04-28 NOTE — Progress Notes (Signed)
Pt.c/o migraine headache but doesn't want Imetrex  Bec.he said it's not working.MD on call Dr.Harwani was called & made him aware & ordered Oxycodone 5mg  po.

## 2017-04-28 NOTE — Progress Notes (Signed)
EKG done  & result relayed to Dr.Harwani & ordered just to give Tylenol 650mg .po.Will continue to monitor pt.

## 2017-04-28 NOTE — Progress Notes (Signed)
Pt.c/o pain on his back of his head & radiates to his left arm & chest.V/S taken T=97.7;HR=74;R=18;B/P=110/80;O2 SAT =94% on RA.;Dr.Harwani was called & ordered EKG stat.

## 2017-04-29 MED ORDER — AZITHROMYCIN 500 MG PO TABS
500.0000 mg | ORAL_TABLET | Freq: Every day | ORAL | Status: DC
  Administered 2017-04-29 – 2017-05-01 (×3): 500 mg via ORAL
  Filled 2017-04-29 (×3): qty 1

## 2017-04-29 MED ORDER — MUSCLE RUB 10-15 % EX CREA
TOPICAL_CREAM | CUTANEOUS | Status: DC | PRN
  Administered 2017-04-29: 04:00:00 via TOPICAL
  Filled 2017-04-29: qty 85

## 2017-04-29 MED ORDER — OXYCODONE HCL 5 MG PO TABS
5.0000 mg | ORAL_TABLET | Freq: Once | ORAL | Status: AC
  Administered 2017-04-29: 5 mg via ORAL
  Filled 2017-04-29: qty 1

## 2017-04-29 NOTE — Progress Notes (Signed)
Subjective:  States overall feels better coughing is better continues to musculoskeletal occasional shoulder pain. No fever or chills. Tolerated Rocephin without any problems  Objective:  Vital Signs in the last 24 hours: Temp:  [97.5 F (36.4 C)-98.7 F (37.1 C)] 98.3 F (36.8 C) (10/28 7902) Pulse Rate:  [75-80] 75 (10/28 0613) Resp:  [17-18] 18 (10/28 0613) BP: (94-111)/(61-82) 94/70 (10/28 0613) SpO2:  [90 %-95 %] 95 % (10/28 0809) Weight:  [99 kg (218 lb 3.2 oz)] 99 kg (218 lb 3.2 oz) (10/28 4097)  Intake/Output from previous day: 10/27 0701 - 10/28 0700 In: 410 [P.O.:360; IV Piggyback:50] Out: 250 [Urine:250] Intake/Output from this shift: Total I/O In: 290 [P.O.:240; IV Piggyback:50] Out: -   Physical Exam: Neck: no adenopathy, no carotid bruit, no JVD and supple, symmetrical, trachea midline Lungs: Air entry has improved Heart: regular rate and rhythm, S1, S2 normal and Soft systolic murmur noted Abdomen: soft, non-tender; bowel sounds normal; no masses,  no organomegaly Extremities: extremities normal, atraumatic, no cyanosis or edema  Lab Results:  Recent Labs  04/26/17 2053 04/27/17 0650  WBC 6.6 5.4  HGB 12.6* 11.9*  PLT 200 172    Recent Labs  04/26/17 2053 04/27/17 0650  NA 136 137  K 3.4* 3.6  CL 101 104  CO2 27 26  GLUCOSE 108* 95  BUN 13 12  CREATININE 1.33* 1.17   No results for input(s): TROPONINI in the last 72 hours.  Invalid input(s): CK, MB Hepatic Function Panel  Recent Labs  04/26/17 2053  PROT 6.6  ALBUMIN 3.9  AST 25  ALT 16*  ALKPHOS 99  BILITOT 1.5*   No results for input(s): CHOL in the last 72 hours. No results for input(s): PROTIME in the last 72 hours.  Imaging: Imaging results have been reviewed and No results found.  Cardiac Studies:  Assessment/Plan:  Resolving Acute right lung pneumonia COPD Hypertension Obesity HIV infection Status post prostate cancer Plan Continue present management   LOS: 3  days    Charolette Forward 04/29/2017, 11:48 AM

## 2017-04-30 ENCOUNTER — Inpatient Hospital Stay (HOSPITAL_COMMUNITY): Payer: Medicare Other

## 2017-04-30 MED ORDER — DARUNAVIR ETHANOLATE 800 MG PO TABS
800.0000 mg | ORAL_TABLET | Freq: Every day | ORAL | Status: DC
  Administered 2017-04-30 – 2017-05-01 (×2): 800 mg via ORAL
  Filled 2017-04-30 (×2): qty 1

## 2017-04-30 NOTE — Progress Notes (Signed)
Ref: Campbell Riches, MD   Subjective:  Headache less severe but bothersome and worried. VS stable. Able to tolerate PO azithromycin.   Objective:  Vital Signs in the last 24 hours: Temp:  [98 F (36.7 C)-98.5 F (36.9 C)] 98.5 F (36.9 C) (10/29 1532) Pulse Rate:  [77-88] 82 (10/29 1532) Cardiac Rhythm: Normal sinus rhythm (10/29 0700) Resp:  [16-18] 18 (10/29 1532) BP: (100-118)/(57-76) 118/76 (10/29 1532) SpO2:  [92 %-98 %] 98 % (10/29 1532) Weight:  [99.5 kg (219 lb 5.7 oz)] 99.5 kg (219 lb 5.7 oz) (10/29 0500)  Physical Exam: BP Readings from Last 1 Encounters:  04/30/17 118/76    Wt Readings from Last 1 Encounters:  04/30/17 99.5 kg (219 lb 5.7 oz)    Weight change: 0.525 kg (1 lb 2.5 oz) Body mass index is 32.39 kg/m. HEENT: [place]/AT, Eyes-Brown, PERL, EOMI, Conjunctiva-Pink, Sclera-Non-icteric Neck: No JVD, No bruit, Trachea midline. Lungs:  Clearing, Bilateral. Minimal wheezing with cough. Cardiac:  Regular rhythm, normal S1 and S2, no S3. II/VI systolic murmur. Abdomen:  Soft, non-tender. BS present. Extremities:  No edema present. No cyanosis. No clubbing. CNS: AxOx3, Cranial nerves grossly intact, moves all 4 extremities.  Skin: Warm and dry.   Intake/Output from previous day: 10/28 0701 - 10/29 0700 In: 290 [P.O.:240; IV Piggyback:50] Out: -     Lab Results: BMET    Component Value Date/Time   NA 137 04/27/2017 0650   NA 136 04/26/2017 2053   NA 139 01/16/2017 0051   NA 142 02/24/2013 0848   K 3.6 04/27/2017 0650   K 3.4 (L) 04/26/2017 2053   K 3.3 (L) 01/16/2017 0051   K 3.4 (L) 02/24/2013 0848   CL 104 04/27/2017 0650   CL 101 04/26/2017 2053   CL 103 01/16/2017 0051   CO2 26 04/27/2017 0650   CO2 27 04/26/2017 2053   CO2 25 01/16/2017 0051   CO2 24 02/24/2013 0848   GLUCOSE 95 04/27/2017 0650   GLUCOSE 108 (H) 04/26/2017 2053   GLUCOSE 115 (H) 01/16/2017 0051   GLUCOSE 121 02/24/2013 0848   GLUCOSE 93 03/25/2009   GLUCOSE 99  03/17/2001   BUN 12 04/27/2017 0650   BUN 13 04/26/2017 2053   BUN 15 01/16/2017 0051   BUN 11.9 02/24/2013 0848   CREATININE 1.17 04/27/2017 0650   CREATININE 1.33 (H) 04/26/2017 2053   CREATININE 1.46 (H) 01/16/2017 0051   CREATININE 1.38 (H) 04/20/2016 1428   CREATININE 1.26 07/27/2015 1652   CREATININE 1.09 05/11/2015 1012   CREATININE 1.3 02/24/2013 0848   CALCIUM 8.8 (L) 04/27/2017 0650   CALCIUM 9.2 04/26/2017 2053   CALCIUM 9.4 01/16/2017 0051   CALCIUM 8.9 02/24/2013 0848   GFRNONAA >60 04/27/2017 0650   GFRNONAA 60 (L) 04/26/2017 2053   GFRNONAA 53 (L) 01/16/2017 0051   GFRNONAA 58 (L) 04/20/2016 1428   GFRNONAA 65 07/27/2015 1652   GFRNONAA 81 10/13/2013 1155   GFRAA >60 04/27/2017 0650   GFRAA >60 04/26/2017 2053   GFRAA >60 01/16/2017 0051   GFRAA 67 04/20/2016 1428   GFRAA 75 07/27/2015 1652   GFRAA >89 10/13/2013 1155   CBC    Component Value Date/Time   WBC 5.4 04/27/2017 0650   RBC 3.75 (L) 04/27/2017 0650   HGB 11.9 (L) 04/27/2017 0650   HGB 14.0 05/11/2015 1108   HCT 36.0 (L) 04/27/2017 0650   HCT 42.1 05/11/2015 1108   PLT 172 04/27/2017 0650   PLT 126 (L) 05/11/2015  1108   MCV 96.0 04/27/2017 0650   MCV 97.5 05/11/2015 1108   MCH 31.7 04/27/2017 0650   MCHC 33.1 04/27/2017 0650   RDW 14.5 04/27/2017 0650   RDW 12.9 05/11/2015 1108   LYMPHSABS 2.3 04/26/2017 2053   LYMPHSABS 1.5 05/11/2015 1108   MONOABS 0.6 04/26/2017 2053   MONOABS 0.3 05/11/2015 1108   EOSABS 0.3 04/26/2017 2053   EOSABS 0.6 (H) 05/11/2015 1108   BASOSABS 0.0 04/26/2017 2053   BASOSABS 0.0 05/11/2015 1108   HEPATIC Function Panel  Recent Labs  09/25/16 0754 01/16/17 0051 04/26/17 2053  PROT 6.7 7.8 6.6   HEMOGLOBIN A1C No components found for: HGA1C,  MPG CARDIAC ENZYMES Lab Results  Component Value Date   CKTOTAL 128 10/10/2010   CKMB 1.7 10/10/2010   TROPONINI <0.03 09/25/2016   TROPONINI <0.03 11/19/2014   TROPONINI <0.30 02/02/2014   BNP No results  for input(s): PROBNP in the last 8760 hours. TSH No results for input(s): TSH in the last 8760 hours. CHOLESTEROL No results for input(s): CHOL in the last 8760 hours.  Scheduled Meds: . albuterol  2.5 mg Nebulization BID  . ALPRAZolam  0.5 mg Oral QHS  . aspirin EC  81 mg Oral QPC breakfast  . azithromycin  500 mg Oral Daily  . darunavir  800 mg Oral Q supper  . emtricitabine-tenofovir AF  1 tablet Oral Daily  . gabapentin  300 mg Oral BID  . heparin  5,000 Units Subcutaneous Q8H  . hydrochlorothiazide  25 mg Oral QPC breakfast  . losartan  25 mg Oral QPC breakfast  . metoprolol succinate  25 mg Oral QPC breakfast  . pantoprazole  40 mg Oral Daily  . potassium chloride  10 mEq Oral Daily  . raltegravir  800 mg Oral QHS  . ritonavir  100 mg Oral QHS   Continuous Infusions: . sodium chloride 50 mL/hr at 04/30/17 0955  . cefTRIAXone (ROCEPHIN)  IV Stopped (04/30/17 0914)   PRN Meds:.acetaminophen, MUSCLE RUB, ondansetron, SUMAtriptan  Assessment/Plan: Acute right lung pneumonia COPD Hypertension Obesity HIV infection S/P prostate cancer Headache  CT of head-negative for acute abnormality.   LOS: 4 days    Dixie Dials  MD  04/30/2017, 5:36 PM

## 2017-04-30 NOTE — Care Management Important Message (Signed)
Important Message  Patient Details  Name: Casey Brennan MRN: [id number] Date of Birth: [birth year]   Medicare Important Message Given:  Yes    Nathen May 04/30/2017, 9:27 AM

## 2017-04-30 NOTE — Telephone Encounter (Signed)
Error

## 2017-05-01 MED ORDER — ALBUTEROL SULFATE (2.5 MG/3ML) 0.083% IN NEBU: 2.5000 mg | INHALATION_SOLUTION | RESPIRATORY_TRACT | Status: DC | PRN

## 2017-05-02 NOTE — Care Management Note (Signed)
Case Management Note  Patient Details  Name: Casey Brennan MRN: [id number] Date of Birth: [birth year]  Subjective/Objective:       Admitted with PNA, hx 042. Resides with sister.         PCP: Bobby Rumpf  Action/Plan: Plan is to d/c to home today. Pt to schedule 1 week f/u appointment with Dr. [name].  Expected Discharge Date:  05/02/17               Expected Discharge Plan:  Home/Self Care  In-House Referral:     Discharge planning Services  CM Consult   Status of Service:  Completed, signed off  If discussed at [place] of Stay Meetings, dates discussed:    Additional Comments:  Sharin Mons, RN 05/02/2017, 12:32 PM

## 2017-05-02 NOTE — Progress Notes (Signed)
Ref: Campbell Riches, MD  Late Entry: Subjective:  CT scan of head was unremarkable. VS stable. Headache is improving.  Objective:  Vital Signs in the last 24 hours: Temp:  [97.9 F (36.6 C)-98.4 F (36.9 C)] 98.4 F (36.9 C) (10/31 0900) Pulse Rate:  [80-85] 80 (10/31 0900) Cardiac Rhythm: Heart block (10/30 1900) Resp:  [16-20] 18 (10/31 0900) BP: (97-131)/(71-93) 124/89 (10/31 0900) SpO2:  [95 %-97 %] 96 % (10/31 0900) Weight:  [98.7 kg (217 lb 8 oz)] 98.7 kg (217 lb 8 oz) (10/31 0500)  Physical Exam: BP Readings from Last 1 Encounters:  05/02/17 124/89    Wt Readings from Last 1 Encounters:  05/02/17 98.7 kg (217 lb 8 oz)    Weight change: -1.943 kg (-4 lb 4.5 oz) Body mass index is 32.12 kg/m. HEENT: [place]/AT, Eyes-Brown, PERL, EOMI, Conjunctiva-Pink, Sclera-Non-icteric Neck: No JVD, No bruit, Trachea midline. Lungs:  Clearing, Bilateral. Cardiac:  Regular rhythm, normal S1 and S2, no S3. II/VI systolic murmur. Abdomen:  Soft, non-tender. BS present. Extremities:  No edema present. No cyanosis. No clubbing. CNS: AxOx3, Cranial nerves grossly intact, moves all 4 extremities.  Skin: Warm and dry.   Intake/Output from previous day: 10/30 0701 - 10/31 0700 In: 1440 [P.O.:840; I.V.:600] Out: 100 [Urine:100]    Lab Results: BMET    Component Value Date/Time   NA 137 04/27/2017 0650   NA 136 04/26/2017 2053   NA 139 01/16/2017 0051   NA 142 02/24/2013 0848   K 3.6 04/27/2017 0650   K 3.4 (L) 04/26/2017 2053   K 3.3 (L) 01/16/2017 0051   K 3.4 (L) 02/24/2013 0848   CL 104 04/27/2017 0650   CL 101 04/26/2017 2053   CL 103 01/16/2017 0051   CO2 26 04/27/2017 0650   CO2 27 04/26/2017 2053   CO2 25 01/16/2017 0051   CO2 24 02/24/2013 0848   GLUCOSE 95 04/27/2017 0650   GLUCOSE 108 (H) 04/26/2017 2053   GLUCOSE 115 (H) 01/16/2017 0051   GLUCOSE 121 02/24/2013 0848   GLUCOSE 93 03/25/2009   GLUCOSE 99 03/17/2001   BUN 12 04/27/2017 0650   BUN 13 04/26/2017  2053   BUN 15 01/16/2017 0051   BUN 11.9 02/24/2013 0848   CREATININE 1.17 04/27/2017 0650   CREATININE 1.33 (H) 04/26/2017 2053   CREATININE 1.46 (H) 01/16/2017 0051   CREATININE 1.38 (H) 04/20/2016 1428   CREATININE 1.26 07/27/2015 1652   CREATININE 1.09 05/11/2015 1012   CREATININE 1.3 02/24/2013 0848   CALCIUM 8.8 (L) 04/27/2017 0650   CALCIUM 9.2 04/26/2017 2053   CALCIUM 9.4 01/16/2017 0051   CALCIUM 8.9 02/24/2013 0848   GFRNONAA >60 04/27/2017 0650   GFRNONAA 60 (L) 04/26/2017 2053   GFRNONAA 53 (L) 01/16/2017 0051   GFRNONAA 58 (L) 04/20/2016 1428   GFRNONAA 65 07/27/2015 1652   GFRNONAA 81 10/13/2013 1155   GFRAA >60 04/27/2017 0650   GFRAA >60 04/26/2017 2053   GFRAA >60 01/16/2017 0051   GFRAA 67 04/20/2016 1428   GFRAA 75 07/27/2015 1652   GFRAA >89 10/13/2013 1155   CBC    Component Value Date/Time   WBC 5.4 04/27/2017 0650   RBC 3.75 (L) 04/27/2017 0650   HGB 11.9 (L) 04/27/2017 0650   HGB 14.0 05/11/2015 1108   HCT 36.0 (L) 04/27/2017 0650   HCT 42.1 05/11/2015 1108   PLT 172 04/27/2017 0650   PLT 126 (L) 05/11/2015 1108   MCV 96.0 04/27/2017 0650  MCV 97.5 05/11/2015 1108   MCH 31.7 04/27/2017 0650   MCHC 33.1 04/27/2017 0650   RDW 14.5 04/27/2017 0650   RDW 12.9 05/11/2015 1108   LYMPHSABS 2.3 04/26/2017 2053   LYMPHSABS 1.5 05/11/2015 1108   MONOABS 0.6 04/26/2017 2053   MONOABS 0.3 05/11/2015 1108   EOSABS 0.3 04/26/2017 2053   EOSABS 0.6 (H) 05/11/2015 1108   BASOSABS 0.0 04/26/2017 2053   BASOSABS 0.0 05/11/2015 1108   HEPATIC Function Panel  Recent Labs  09/25/16 0754 01/16/17 0051 04/26/17 2053  PROT 6.7 7.8 6.6   HEMOGLOBIN A1C No components found for: HGA1C,  MPG CARDIAC ENZYMES Lab Results  Component Value Date   CKTOTAL 128 10/10/2010   CKMB 1.7 10/10/2010   TROPONINI <0.03 09/25/2016   TROPONINI <0.03 11/19/2014   TROPONINI <0.30 02/02/2014   BNP No results for input(s): PROBNP in the last 8760 hours. TSH No  results for input(s): TSH in the last 8760 hours. CHOLESTEROL No results for input(s): CHOL in the last 8760 hours.  Scheduled Meds: . ALPRAZolam  0.5 mg Oral QHS  . aspirin EC  81 mg Oral QPC breakfast  . azithromycin  500 mg Oral Daily  . darunavir  800 mg Oral Q supper  . emtricitabine-tenofovir AF  1 tablet Oral Daily  . gabapentin  300 mg Oral BID  . heparin  5,000 Units Subcutaneous Q8H  . hydrochlorothiazide  25 mg Oral QPC breakfast  . losartan  25 mg Oral QPC breakfast  . metoprolol succinate  25 mg Oral QPC breakfast  . pantoprazole  40 mg Oral Daily  . potassium chloride  10 mEq Oral Daily  . raltegravir  800 mg Oral QHS  . ritonavir  100 mg Oral QHS   Continuous Infusions: . sodium chloride 50 mL/hr at 04/30/17 0955  . cefTRIAXone (ROCEPHIN)  IV Stopped (05/02/17 1030)   PRN Meds:.acetaminophen, albuterol, MUSCLE RUB, ondansetron, SUMAtriptan  Assessment/Plan: Acute right lung pneumonia COPD Hypertension Obesity HIV infection S/P prostate cancer Headache  Increase activity. Discharge in AM.    LOS: 6 days    Casey Dials  MD  05/02/2017, 12:29 PM

## 2017-05-02 NOTE — Progress Notes (Signed)
[name] to be D/C'd Home per MD order.  Discussed with the patient and all questions fully answered.  VSS, Skin clean, dry and intact without evidence of skin break down, no evidence of skin tears noted. IV catheter discontinued intact. Site without signs and symptoms of complications. Dressing and pressure applied.  An After Visit Summary was printed and given to the patient. Patient received prescription.  D/c education completed with patient/family including follow up instructions, medication list, d/c activities limitations if indicated, with other d/c instructions as indicated by MD - patient able to verbalize understanding, all questions fully answered.   Patient instructed to return to ED, call 911, or call MD for any changes in condition.   Patient escorted via [place], and D/C home via private auto.  Luci Bank 05/02/2017 1:59 PM
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
## 2017-05-02 NOTE — Consult Note (Signed)
            [facility] CM Primary Care Navigator  05/02/2017  ASCENCION COYE 1960/09/24 [id number]   Wentto seepatient at the bedside to identify possible discharge needs but he was alreadydischargedper staff report.  Patient was discharged home today.  Primary care provider's office called Kennyth Lose) to notify of patient's discharge and need for post hospital follow-up and transition of care. Notified of health issues needing follow-up (mainly PNA/ COPD).  Made aware to refer patient to [facility] care management if deemed necessary or appropriate for services.  For additional questions please contact:  Edwena Felty A. Adil Tugwell, BSN, RN-BC [facility] PRIMARY CARE Navigator Cell: [phone]

## 2017-05-07 DIAGNOSIS — I2510 Atherosclerotic heart disease of native coronary artery without angina pectoris: Secondary | ICD-10-CM | POA: Diagnosis not present

## 2017-05-07 DIAGNOSIS — J208 Acute bronchitis due to other specified organisms: Secondary | ICD-10-CM | POA: Diagnosis not present

## 2017-05-08 ENCOUNTER — Other Ambulatory Visit: Payer: Self-pay | Admitting: Infectious Diseases

## 2017-05-08 ENCOUNTER — Other Ambulatory Visit: Payer: Self-pay | Admitting: Internal Medicine

## 2017-05-08 DIAGNOSIS — J302 Other seasonal allergic rhinitis: Secondary | ICD-10-CM

## 2017-05-09 NOTE — Telephone Encounter (Signed)
Refill request for omeprazole 40 mg once a day. Colon/EGD scheduled for 04-05-2017 was cancelled and no rescheduled. No current scheduled follow up. Please advise on refills.

## 2017-05-09 NOTE — Telephone Encounter (Signed)
Procedures were canceled by the patient and never rescheduled after being seen in the office.  At that time upper endoscopy and colonoscopy were recommended Office visit should be rescheduled, and if so, refills can be provided until that appointment

## 2017-05-11 ENCOUNTER — Other Ambulatory Visit: Payer: Self-pay | Admitting: Infectious Diseases

## 2017-05-11 ENCOUNTER — Other Ambulatory Visit: Payer: Self-pay | Admitting: Neurology

## 2017-05-11 DIAGNOSIS — G63 Polyneuropathy in diseases classified elsewhere: Principal | ICD-10-CM

## 2017-05-11 DIAGNOSIS — B20 Human immunodeficiency virus [HIV] disease: Secondary | ICD-10-CM

## 2017-05-11 DIAGNOSIS — R252 Cramp and spasm: Secondary | ICD-10-CM

## 2017-05-11 DIAGNOSIS — G250 Essential tremor: Secondary | ICD-10-CM

## 2017-05-11 DIAGNOSIS — G44229 Chronic tension-type headache, not intractable: Secondary | ICD-10-CM

## 2017-05-23 ENCOUNTER — Other Ambulatory Visit: Payer: Self-pay | Admitting: Infectious Diseases

## 2017-06-06 ENCOUNTER — Other Ambulatory Visit: Payer: Self-pay | Admitting: Infectious Diseases

## 2017-06-06 ENCOUNTER — Ambulatory Visit
Admission: RE | Admit: 2017-06-06 | Discharge: 2017-06-06 | Disposition: A | Discharge status: Home / Self Care | Payer: Medicare Other | Source: Ambulatory Visit | Attending: Infectious Diseases | Admitting: Infectious Diseases

## 2017-06-06 ENCOUNTER — Other Ambulatory Visit (HOSPITAL_COMMUNITY)
Admission: RE | Admit: 2017-06-06 | Discharge: 2017-06-06 | Disposition: A | Discharge status: Home / Self Care | Payer: Medicare Other | Source: Ambulatory Visit | Attending: Infectious Diseases | Admitting: Infectious Diseases

## 2017-06-06 ENCOUNTER — Encounter: Payer: Self-pay | Admitting: Infectious Diseases

## 2017-06-06 ENCOUNTER — Ambulatory Visit (INDEPENDENT_AMBULATORY_CARE_PROVIDER_SITE_OTHER): Payer: Medicare Other | Admitting: Infectious Diseases

## 2017-06-06 VITALS — BP 147/100 | HR 84 | Temp 98.5°F | Ht 69.0 in | Wt 222.0 lb

## 2017-06-06 DIAGNOSIS — M25532 Pain in left wrist: Secondary | ICD-10-CM

## 2017-06-06 DIAGNOSIS — Z789 Other specified health status: Secondary | ICD-10-CM | POA: Diagnosis not present

## 2017-06-06 DIAGNOSIS — M19072 Primary osteoarthritis, left ankle and foot: Secondary | ICD-10-CM | POA: Diagnosis not present

## 2017-06-06 DIAGNOSIS — Z21 Asymptomatic human immunodeficiency virus [HIV] infection status: Secondary | ICD-10-CM | POA: Diagnosis not present

## 2017-06-06 DIAGNOSIS — Z79899 Other long term (current) drug therapy: Secondary | ICD-10-CM | POA: Insufficient documentation

## 2017-06-06 DIAGNOSIS — G8929 Other chronic pain: Secondary | ICD-10-CM

## 2017-06-06 DIAGNOSIS — Z113 Encounter for screening for infections with a predominantly sexual mode of transmission: Secondary | ICD-10-CM | POA: Diagnosis not present

## 2017-06-06 DIAGNOSIS — G43009 Migraine without aura, not intractable, without status migrainosus: Secondary | ICD-10-CM | POA: Diagnosis not present

## 2017-06-06 DIAGNOSIS — B20 Human immunodeficiency virus [HIV] disease: Secondary | ICD-10-CM | POA: Diagnosis not present

## 2017-06-06 DIAGNOSIS — Z8546 Personal history of malignant neoplasm of prostate: Secondary | ICD-10-CM | POA: Insufficient documentation

## 2017-06-06 DIAGNOSIS — J449 Chronic obstructive pulmonary disease, unspecified: Secondary | ICD-10-CM | POA: Insufficient documentation

## 2017-06-06 DIAGNOSIS — M25572 Pain in left ankle and joints of left foot: Principal | ICD-10-CM

## 2017-06-06 DIAGNOSIS — N182 Chronic kidney disease, stage 2 (mild): Secondary | ICD-10-CM

## 2017-06-06 MED ORDER — BECLOMETHASONE DIPROPIONATE 80 MCG/ACT NA AERS: 2.0000 | INHALATION_SPRAY | Freq: Once | NASAL | 2 refills | Status: DC

## 2017-06-06 MED ORDER — L-METHYLFOLATE-B6-B12 3-35-2 MG PO TABS: 1.0000 | ORAL_TABLET | Freq: Every day | ORAL | 3 refills | Status: DC

## 2017-06-06 MED ORDER — ONDANSETRON 8 MG PO TBDP: 8.0000 mg | ORAL_TABLET | Freq: Three times a day (TID) | ORAL | 3 refills | Status: DC | PRN

## 2017-06-06 MED ORDER — MUPIROCIN 2 % EX OINT: TOPICAL_OINTMENT | CUTANEOUS | 2 refills | Status: DC

## 2017-06-06 MED ORDER — SUMATRIPTAN SUCCINATE 100 MG PO TABS: 100.0000 mg | ORAL_TABLET | ORAL | 0 refills | Status: DC | PRN

## 2017-06-06 MED ORDER — EMTRICITABINE-TENOFOVIR AF 200-25 MG PO TABS: 1.0000 | ORAL_TABLET | Freq: Every day | ORAL | 3 refills | Status: DC

## 2017-06-06 MED ORDER — DICLOFENAC SODIUM 1 % TD GEL: TRANSDERMAL | 3 refills | Status: DC

## 2017-06-06 NOTE — Assessment & Plan Note (Signed)
Will check his labs today.  He has gotten flu shot.  His truvada is changed to descovy Appears to be doing well aside from his comorbid illnesses.  rtc in 6 monnths.
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
## 2017-06-06 NOTE — Progress Notes (Signed)
Xray changed per MD
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
## 2017-06-06 NOTE — Assessment & Plan Note (Signed)
Will change him to descovey to see if this helps.

## 2017-06-07 ENCOUNTER — Other Ambulatory Visit: Payer: Self-pay | Admitting: Infectious Diseases

## 2017-06-07 LAB — COMPREHENSIVE METABOLIC PANEL
AG Ratio: 1.5 (calc) (ref 1.0–2.5)
ALT: 14 U/L (ref 9–46)
AST: 17 U/L (ref 10–35)
Albumin: 4.1 g/dL (ref 3.6–5.1)
Alkaline phosphatase (APISO): 112 U/L (ref 40–115)
BUN: 9 mg/dL (ref 7–25)
CO2: 25 mmol/L (ref 20–32)
Calcium: 9.3 mg/dL (ref 8.6–10.3)
Chloride: 104 mmol/L (ref 98–110)
Creat: 1.13 mg/dL (ref 0.70–1.33)
Globulin: 2.7 g/dL (calc) (ref 1.9–3.7)
Glucose, Bld: 90 mg/dL (ref 65–99)
Potassium: 3.8 mmol/L (ref 3.5–5.3)
Sodium: 137 mmol/L (ref 135–146)
Total Bilirubin: 0.8 mg/dL (ref 0.2–1.2)
Total Protein: 6.8 g/dL (ref 6.1–8.1)

## 2017-06-07 LAB — CBC
HCT: 42 % (ref 38.5–50.0)
Hemoglobin: 13.8 g/dL (ref 13.2–17.1)
MCH: 31.1 pg (ref 27.0–33.0)
MCHC: 32.9 g/dL (ref 32.0–36.0)
MCV: 94.6 fL (ref 80.0–100.0)
MPV: 13.3 fL — ABNORMAL HIGH (ref 7.5–12.5)
Platelets: 173 10*3/uL (ref 140–400)
RBC: 4.44 10*6/uL (ref 4.20–5.80)
RDW: 14.5 % (ref 11.0–15.0)
WBC: 5.9 10*3/uL (ref 3.8–10.8)

## 2017-06-07 LAB — URINE CYTOLOGY ANCILLARY ONLY
Chlamydia: NEGATIVE
Neisseria Gonorrhea: NEGATIVE

## 2017-06-07 LAB — LIPID PANEL
Cholesterol: 143 mg/dL (ref <200)
HDL: 54 mg/dL (ref >40)
LDL Cholesterol (Calc): 75 mg/dL (calc)
Non-HDL Cholesterol (Calc): 89 mg/dL (calc) (ref <130)
Total CHOL/HDL Ratio: 2.6 (calc) (ref <5.0)
Triglycerides: 68 mg/dL (ref <150)

## 2017-06-07 LAB — T-HELPER CELL (CD4) - (RCID CLINIC ONLY)
CD4 % Helper T Cell: 23 % — ABNORMAL LOW (ref 33–55)
CD4 T Cell Abs: 530 /uL (ref 400–2700)

## 2017-06-07 LAB — RPR: RPR Ser Ql: NONREACTIVE

## 2017-06-08 ENCOUNTER — Ambulatory Visit: Payer: Medicare Other | Admitting: Infectious Diseases

## 2017-06-08 LAB — HIV-1 RNA ULTRAQUANT REFLEX TO GENTYP+
HIV 1 RNA Quant: <20 Copies/mL
HIV-1 RNA Quant, Log: <1.3 Log cps/mL

## 2017-06-15 ENCOUNTER — Telehealth: Payer: Self-pay | Admitting: Internal Medicine

## 2017-06-15 NOTE — Telephone Encounter (Signed)
Ok to change provider since a doctor has specifically asked. We can try Dr Lake Bells or [facility] but would also send this message for FYI.

## 2017-06-15 NOTE — Telephone Encounter (Signed)
This needs to checked by Veto Kemps

## 2017-06-19 NOTE — Telephone Encounter (Signed)
If another provider is requesting the MD switch then it does not need to be ok'ed by the providers.  Patient just needs to be scheduled with another provider for his referral of COPD.  lmtcb X1 for pt to schedule consult.

## 2017-06-20 NOTE — Telephone Encounter (Signed)
Attempted to contact pt. Received a recording that the number I was trying to reach is temporarily out of service. Will try back.

## 2017-06-21 NOTE — Telephone Encounter (Signed)
lmtcb x2 for pt. 

## 2017-06-22 NOTE — Telephone Encounter (Signed)
lmtcb x3 for pt. 

## 2017-06-24 ENCOUNTER — Encounter: Payer: Self-pay | Admitting: Infectious Diseases

## 2017-06-24 ENCOUNTER — Other Ambulatory Visit: Payer: Self-pay | Admitting: Neurology

## 2017-06-25 NOTE — Telephone Encounter (Signed)
Left message for pt to call back x4.

## 2017-06-27 ENCOUNTER — Telehealth: Payer: Self-pay | Admitting: Internal Medicine

## 2017-06-27 MED ORDER — TIZANIDINE HCL 6 MG PO CAPS: 6.0000 mg | ORAL_CAPSULE | Freq: Three times a day (TID) | ORAL | 0 refills | Status: DC | PRN

## 2017-06-27 NOTE — Telephone Encounter (Signed)
Pt was seen once in 07/2014 by MW for COPD.  Pt has been referred back to clinic by Dr. [name]- requesting a different provider.  Next available consult slot is with PM on 07/13/17.  MW ok for pt to switch? PM ok to take on copd pt?  Thanks!

## 2017-06-27 NOTE — Telephone Encounter (Signed)
I called Casey Brennan, [phone] (who called while we were closed on 06/25/17 and was msg left with answering service) and advised we are working on getting this approved and she asked we call her with appointment time.

## 2017-06-27 NOTE — Telephone Encounter (Signed)
Dr. Vaughan Browner, please advise if you are okay with taking this COPD pt as MW is okay with pt switching providers per Dr. Algis Downs request.    Thanks!

## 2017-06-27 NOTE — Telephone Encounter (Signed)
I will close encounter due to office protocol. Pt has been contacted X 3 without success.

## 2017-06-27 NOTE — Telephone Encounter (Signed)
Fine with me

## 2017-06-27 NOTE — Telephone Encounter (Signed)
OK 

## 2017-06-28 ENCOUNTER — Telehealth: Payer: Self-pay | Admitting: Internal Medicine

## 2017-06-28 NOTE — Telephone Encounter (Signed)
Thank you Will route to RA

## 2017-06-28 NOTE — Telephone Encounter (Signed)
Left a message for April to call back to make an appt with PM for pt.

## 2017-06-28 NOTE — Telephone Encounter (Signed)
Spoke with pt's sister, April. April is requesting that pt switch from Dr. Melvyn Novas to Dr. Elsworth Soho, as pt's PCP recommended Dr. Elsworth Soho.    Dr. Melvyn Novas please advise. Thanks.

## 2017-06-28 NOTE — Telephone Encounter (Signed)
Fine with me

## 2017-07-02 DIAGNOSIS — M7712 Lateral epicondylitis, left elbow: Secondary | ICD-10-CM | POA: Diagnosis not present

## 2017-07-02 DIAGNOSIS — M25572 Pain in left ankle and joints of left foot: Secondary | ICD-10-CM | POA: Diagnosis not present

## 2017-07-04 NOTE — Telephone Encounter (Signed)
Okay with me to schedule routine office visit

## 2017-07-04 NOTE — Telephone Encounter (Signed)
lmtcb x1 for pt's sister, April.

## 2017-07-05 NOTE — Telephone Encounter (Signed)
lmtcb x2 for pt's sister, April

## 2017-07-06 NOTE — Telephone Encounter (Signed)
LM for sister to call back,

## 2017-07-06 NOTE — Telephone Encounter (Signed)
We have contacted pt X 3 and per office protocol , this encounter will be closed.

## 2017-07-11 ENCOUNTER — Ambulatory Visit: Payer: Medicare Other | Admitting: Internal Medicine

## 2017-07-22 ENCOUNTER — Other Ambulatory Visit: Payer: Self-pay | Admitting: Infectious Diseases

## 2017-07-22 DIAGNOSIS — B20 Human immunodeficiency virus [HIV] disease: Secondary | ICD-10-CM

## 2017-07-30 ENCOUNTER — Telehealth: Payer: Self-pay | Admitting: *Deleted

## 2017-07-30 NOTE — Telephone Encounter (Signed)
Patient's sister called regarding his ambien prescription and metanx vitamin prescription. RN relayed that DR [name] deferred the Lorrin Mais to primary care in December (patient notified at that time). Metanx prescription sent to [facility] 12/5.  April asked this RN to call the pharmacy.   Per [facility], prescription has a Part D exclusion, will be approximately 40/month after they applied a discount coupon.  They will order it, have it ready tomorrow. RN relayed information to April, she verbalized understanding and agreement. Landis Gandy, RN

## 2017-08-02 ENCOUNTER — Other Ambulatory Visit: Payer: Self-pay | Admitting: Neurology

## 2017-08-02 MED ORDER — TIZANIDINE HCL 2 MG PO TABS: ORAL_TABLET | ORAL | 0 refills | Status: DC

## 2017-08-03 ENCOUNTER — Ambulatory Visit: Payer: Medicare Other | Admitting: Adult Health

## 2017-08-04 ENCOUNTER — Other Ambulatory Visit: Payer: Self-pay | Admitting: Infectious Diseases

## 2017-08-04 DIAGNOSIS — B20 Human immunodeficiency virus [HIV] disease: Secondary | ICD-10-CM

## 2017-08-06 ENCOUNTER — Other Ambulatory Visit: Payer: Self-pay | Admitting: *Deleted

## 2017-08-06 DIAGNOSIS — B20 Human immunodeficiency virus [HIV] disease: Secondary | ICD-10-CM

## 2017-08-06 MED ORDER — ISENTRESS 400 MG PO TABS: 800.0000 mg | ORAL_TABLET | Freq: Every day | ORAL | 1 refills | Status: DC

## 2017-08-06 MED ORDER — DARUNAVIR ETHANOLATE 800 MG PO TABS: ORAL_TABLET | ORAL | 1 refills | Status: DC

## 2017-08-06 MED ORDER — EMTRICITABINE-TENOFOVIR AF 200-25 MG PO TABS: 1.0000 | ORAL_TABLET | Freq: Every day | ORAL | 1 refills | Status: DC

## 2017-08-06 MED ORDER — NORVIR 100 MG PO TABS: 100.0000 mg | ORAL_TABLET | Freq: Every day | ORAL | 1 refills | Status: DC

## 2017-08-07 ENCOUNTER — Other Ambulatory Visit: Payer: Self-pay | Admitting: Cardiovascular Disease

## 2017-08-07 ENCOUNTER — Ambulatory Visit
Admission: RE | Admit: 2017-08-07 | Discharge: 2017-08-07 | Disposition: A | Discharge status: Home / Self Care | Payer: Medicare Other | Source: Ambulatory Visit | Attending: Cardiovascular Disease | Admitting: Cardiovascular Disease

## 2017-08-07 DIAGNOSIS — J189 Pneumonia, unspecified organism: Secondary | ICD-10-CM

## 2017-08-07 DIAGNOSIS — I2510 Atherosclerotic heart disease of native coronary artery without angina pectoris: Secondary | ICD-10-CM | POA: Diagnosis not present

## 2017-08-07 DIAGNOSIS — R0602 Shortness of breath: Secondary | ICD-10-CM | POA: Diagnosis not present

## 2017-08-18 ENCOUNTER — Other Ambulatory Visit: Payer: Self-pay | Admitting: Infectious Diseases

## 2017-08-18 DIAGNOSIS — B20 Human immunodeficiency virus [HIV] disease: Secondary | ICD-10-CM

## 2017-08-22 ENCOUNTER — Telehealth: Payer: Self-pay | Admitting: *Deleted

## 2017-08-22 NOTE — Telephone Encounter (Signed)
Patient called to see if Dr [name] would represcribe the cough syrup he prescribed in the hospital. Patient declined an appointment to assess cause/need for cough syrup, does not want to see PCP or pulmonary for this need. Landis Gandy, RN

## 2017-08-22 NOTE — Telephone Encounter (Signed)
If this is the tussionex that he received in the hospital, we are not able to do this as it is a narcotic.

## 2017-08-22 NOTE — Telephone Encounter (Signed)
RN relayed Dr Algis Downs message, patient disconnected the call.

## 2017-08-25 ENCOUNTER — Other Ambulatory Visit: Payer: Self-pay | Admitting: Infectious Diseases

## 2017-08-25 ENCOUNTER — Other Ambulatory Visit: Payer: Self-pay | Admitting: Internal Medicine

## 2017-08-25 DIAGNOSIS — B20 Human immunodeficiency virus [HIV] disease: Secondary | ICD-10-CM

## 2017-08-28 ENCOUNTER — Telehealth: Payer: Self-pay | Admitting: *Deleted

## 2017-08-28 NOTE — Telephone Encounter (Signed)
Ok to switch back thanks

## 2017-08-28 NOTE — Telephone Encounter (Signed)
Patient's sister requesting call from Dr [name] regarding switch from truvada to descovy. She states [name] is allergic to descovy, should be on truvada. Please advise. Landis Gandy, RN

## 2017-08-29 NOTE — Telephone Encounter (Signed)
Casey Brennan is requesting a call from Dr [name] to discuss medication. Please update his chart with current regimen. Thank you!

## 2017-08-31 ENCOUNTER — Telehealth: Payer: Self-pay | Admitting: Infectious Diseases

## 2017-08-31 DIAGNOSIS — G609 Hereditary and idiopathic neuropathy, unspecified: Secondary | ICD-10-CM

## 2017-08-31 MED ORDER — EMTRICITABINE-TENOFOVIR DF 200-300 MG PO TABS: 1.0000 | ORAL_TABLET | Freq: Every day | ORAL | 3 refills | Status: DC

## 2017-08-31 NOTE — Telephone Encounter (Signed)
Called and spoke with sister about his meds.  New rx sent to his pharmacy.  Will review expectations when in clinic.

## 2017-08-31 NOTE — Telephone Encounter (Signed)
Sister called this Am His meds were clarified and refilled

## 2017-09-18 ENCOUNTER — Telehealth: Payer: Self-pay | Admitting: Neurology

## 2017-09-18 NOTE — Telephone Encounter (Signed)
Attempted to do a PA for the patient but the insurance we have on file is incorrect. I have called the pt and he was unavailable to give me this information at this time. He states that he will call back with the information when he has a moment.  When pt calls back please ask who his insurance is through and this information.  Member ID # [id number] # [id number]# PCN #  Once I have this info I will be able to start the PA for the Tizanidine 6mg  cap.

## 2017-09-20 NOTE — Telephone Encounter (Signed)
If patient chooses to purchase without running it through insurance patient can go to goodrx.com and find the cheapest place to get the medication filled. Appears when I look it up [facility] is cheapest. A 3 month supply would cost about 61$. A one month supply would be 28$.

## 2017-10-28 ENCOUNTER — Other Ambulatory Visit: Payer: Self-pay | Admitting: Internal Medicine

## 2017-10-28 ENCOUNTER — Other Ambulatory Visit: Payer: Self-pay | Admitting: Infectious Diseases

## 2017-10-28 ENCOUNTER — Other Ambulatory Visit: Payer: Self-pay | Admitting: Neurology

## 2017-10-28 DIAGNOSIS — J449 Chronic obstructive pulmonary disease, unspecified: Secondary | ICD-10-CM

## 2017-10-28 DIAGNOSIS — R252 Cramp and spasm: Secondary | ICD-10-CM

## 2017-10-28 DIAGNOSIS — G250 Essential tremor: Secondary | ICD-10-CM

## 2017-10-28 DIAGNOSIS — G63 Polyneuropathy in diseases classified elsewhere: Principal | ICD-10-CM

## 2017-10-28 DIAGNOSIS — B20 Human immunodeficiency virus [HIV] disease: Secondary | ICD-10-CM

## 2017-10-28 DIAGNOSIS — G44229 Chronic tension-type headache, not intractable: Secondary | ICD-10-CM

## 2017-11-01 ENCOUNTER — Ambulatory Visit (HOSPITAL_COMMUNITY)
Admission: EM | Admit: 2017-11-01 | Discharge: 2017-11-01 | Disposition: A | Discharge status: Home / Self Care | Payer: Medicare Other | Attending: Family Medicine | Admitting: Family Medicine

## 2017-11-01 ENCOUNTER — Encounter (HOSPITAL_COMMUNITY): Payer: Self-pay | Admitting: Family Medicine

## 2017-11-01 DIAGNOSIS — M25561 Pain in right knee: Secondary | ICD-10-CM
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
## 2017-11-12 ENCOUNTER — Ambulatory Visit: Payer: Self-pay | Admitting: Nurse Practitioner

## 2017-11-12 ENCOUNTER — Ambulatory Visit (INDEPENDENT_AMBULATORY_CARE_PROVIDER_SITE_OTHER): Payer: Medicare Other | Admitting: Nurse Practitioner

## 2017-11-12 ENCOUNTER — Encounter: Payer: Self-pay | Admitting: Nurse Practitioner

## 2017-11-12 VITALS — BP 136/84 | HR 84 | Ht 69.0 in | Wt 228.0 lb

## 2017-11-12 DIAGNOSIS — G4700 Insomnia, unspecified: Secondary | ICD-10-CM

## 2017-11-12 DIAGNOSIS — G609 Hereditary and idiopathic neuropathy, unspecified: Secondary | ICD-10-CM | POA: Diagnosis not present

## 2017-11-12 DIAGNOSIS — B20 Human immunodeficiency virus [HIV] disease: Secondary | ICD-10-CM

## 2017-11-12 DIAGNOSIS — R252 Cramp and spasm: Secondary | ICD-10-CM

## 2017-11-12 DIAGNOSIS — G44209 Tension-type headache, unspecified, not intractable: Secondary | ICD-10-CM | POA: Diagnosis not present

## 2017-11-12 DIAGNOSIS — G63 Polyneuropathy in diseases classified elsewhere: Secondary | ICD-10-CM

## 2017-11-12 MED ORDER — ALPRAZOLAM 0.5 MG PO TABS: ORAL_TABLET | ORAL | 5 refills | Status: DC

## 2017-11-12 MED ORDER — TIZANIDINE HCL 2 MG PO TABS: ORAL_TABLET | ORAL | 3 refills | Status: DC

## 2017-11-12 MED ORDER — GABAPENTIN 300 MG PO CAPS: 300.0000 mg | ORAL_CAPSULE | Freq: Four times a day (QID) | ORAL | 11 refills | Status: DC

## 2017-11-12 NOTE — Patient Instructions (Signed)
Continue gabapentin 300 mg 4 times daily Continue tizanidine milligrams 3 tablet 3 times daily as needed Refill Xanax 0.5 mg at bedtime Follow-up yearly and as needed

## 2017-11-19 ENCOUNTER — Telehealth: Payer: Self-pay | Admitting: Nurse Practitioner

## 2017-11-19 NOTE — Telephone Encounter (Signed)
Pt sister(on DPR) has called stating that a PA is needed on pt's tiZANidine (ZANAFLEX) 2 MG tablet

## 2017-11-20 NOTE — Telephone Encounter (Addendum)
Spoke with sister, April on [place] and advised her Tizanidine is on patient's drug plan, no PA needed. April stated he must take brand only, has ben on brand only since 2013. This RN noted that DAW is not found on any of his Rx for Tizanidine; sister stated he had side effects from Tizanidine, needs Zanaflex. This RN stated will attempt PA again for Zanaflex. Will call pharmacy for clarification as to which patient has been receiving.   [facility], spoke with [facility] who stated patient has always received generic Tizanidine from them. He was getting capsules but his insurance quit covering capsules. The manufacturer that made the med he was getting no longer makes it in capsules. She stated patient and his sister insist he has always been on brand and must take capsules.  Pt wants that particular manufacturer and pharmacist explained that the manufacturer doesn't make caps, and his insurance doesn't cover caps now.   The pharmacy can supply generic Tizanidine in tablets from a different manufacturer with $0 co pay.  Called [facility] pharmacy help desk, [phone], spoke with Suezanne Jacquet and began PA for brand name Zanaflex 2 mg capsules.  REF #[id number] , will get decision within 72 hours via fax.

## 2017-11-20 NOTE — Telephone Encounter (Signed)
Per cover my meds: This medication is on your plan's list of covered drugs. Prior authorization is not required at this time. If your pharmacy has questions regarding the processing of your prescription, please have them call the [facility] pharmacy help desk at [phone]. For additional information, the member can contact Member Services by calling the number on the back of their ID Card.

## 2017-11-20 NOTE — Telephone Encounter (Signed)
Started PA for Tizanidine on C[facility], [facility].

## 2017-11-21 NOTE — Telephone Encounter (Signed)
Received fax from [facility] re: Zanaflez 20 mg capsules approved as non-formulary exception, approved through 07/02/18, Medicare Part D benefit. PA- [id number] Called sister and advised, provided her with PA # for reference should pharmacist have insurance coverage question, issue. April verbalized understanding, appreciation.

## 2017-11-23 ENCOUNTER — Other Ambulatory Visit: Payer: Self-pay | Admitting: Neurology

## 2017-11-23 NOTE — Progress Notes (Signed)
I agree with the assessment and plan as directed by NP .The patient is known to me .   Rilynn Habel, MD  

## 2017-12-03 ENCOUNTER — Encounter: Payer: Self-pay | Admitting: Internal Medicine

## 2017-12-03 ENCOUNTER — Other Ambulatory Visit: Payer: Self-pay | Admitting: Infectious Diseases

## 2017-12-03 ENCOUNTER — Encounter: Payer: Self-pay | Admitting: Infectious Diseases

## 2017-12-03 DIAGNOSIS — J449 Chronic obstructive pulmonary disease, unspecified: Secondary | ICD-10-CM

## 2017-12-03 DIAGNOSIS — G43009 Migraine without aura, not intractable, without status migrainosus: Secondary | ICD-10-CM

## 2017-12-03 MED ORDER — SUMATRIPTAN SUCCINATE 100 MG PO TABS: 100.0000 mg | ORAL_TABLET | ORAL | 1 refills | Status: DC | PRN

## 2017-12-03 MED ORDER — ALBUTEROL SULFATE HFA 108 (90 BASE) MCG/ACT IN AERS: 2.0000 | INHALATION_SPRAY | Freq: Four times a day (QID) | RESPIRATORY_TRACT | 1 refills | Status: DC | PRN

## 2017-12-04 ENCOUNTER — Other Ambulatory Visit: Payer: Self-pay | Admitting: Infectious Diseases

## 2017-12-04 DIAGNOSIS — J45909 Unspecified asthma, uncomplicated: Secondary | ICD-10-CM

## 2017-12-04 DIAGNOSIS — G43009 Migraine without aura, not intractable, without status migrainosus: Secondary | ICD-10-CM

## 2017-12-04 MED ORDER — SUMATRIPTAN SUCCINATE 100 MG PO TABS: 100.0000 mg | ORAL_TABLET | ORAL | 2 refills | Status: DC | PRN

## 2017-12-04 MED ORDER — BENZONATATE 100 MG PO CAPS: 100.0000 mg | ORAL_CAPSULE | Freq: Three times a day (TID) | ORAL | 0 refills | Status: AC

## 2017-12-04 NOTE — Telephone Encounter (Signed)
Patient's sister April is calling to follow up on his request for Immitrex refills, Tussionex instead of over-the-counter cough medicine, and to request a name-brand override for Ventolin inhaler rather than Pro-Air.   Please advise on refills of Tussionex. Landis Gandy, RN

## 2017-12-04 NOTE — Telephone Encounter (Signed)
Hello Casey Brennan,  Unfortunately, I will not be able to provide a refill for this medication at this time.  The reason being is it appears that you are now taking Xanax 0.5mg  at bedtime which you were not taking the last time I saw you.  This was prescribed recently by your Neurology office on May 13 with 5 refills.  It would not be prudent to combine these 2 medications due to their potential for sedation.  Please let us know if you have any questions.  Thanks.

## 2017-12-12 ENCOUNTER — Telehealth: Payer: Self-pay | Admitting: *Deleted

## 2017-12-12 NOTE — Telephone Encounter (Signed)
I called pt sister April, unable to LVM pt needs to sign a release form before we  can release medical information.

## 2017-12-17 ENCOUNTER — Telehealth: Payer: Self-pay

## 2017-12-17 NOTE — Telephone Encounter (Signed)
PA for pt's Ventolin HFA INH w/dos CTR 200 puffs was initiated today 12/17/17. PA is pending for clinical review should receive a fax with decision within 24-72 hrs. [place]

## 2017-12-22 ENCOUNTER — Other Ambulatory Visit: Payer: Self-pay | Admitting: Infectious Diseases

## 2017-12-22 DIAGNOSIS — K219 Gastro-esophageal reflux disease without esophagitis: Secondary | ICD-10-CM

## 2018-02-22 ENCOUNTER — Other Ambulatory Visit: Payer: Self-pay | Admitting: Internal Medicine

## 2018-02-23 ENCOUNTER — Encounter (HOSPITAL_COMMUNITY): Payer: Self-pay | Admitting: Emergency Medicine

## 2018-02-23 ENCOUNTER — Emergency Department (HOSPITAL_COMMUNITY)
Admission: EM | Admit: 2018-02-23 | Discharge: 2018-02-23 | Disposition: A | Discharge status: Home / Self Care | Payer: Medicare Other | Attending: Emergency Medicine | Admitting: Emergency Medicine

## 2018-02-23 DIAGNOSIS — Z87891 Personal history of nicotine dependence: Secondary | ICD-10-CM | POA: Diagnosis not present

## 2018-02-23 DIAGNOSIS — Z79899 Other long term (current) drug therapy: Secondary | ICD-10-CM | POA: Insufficient documentation

## 2018-02-23 DIAGNOSIS — I10 Essential (primary) hypertension: Secondary | ICD-10-CM | POA: Insufficient documentation

## 2018-02-23 DIAGNOSIS — B20 Human immunodeficiency virus [HIV] disease: Secondary | ICD-10-CM | POA: Diagnosis not present

## 2018-02-23 DIAGNOSIS — T7809XA Anaphylactic reaction due to other food products, initial encounter: Secondary | ICD-10-CM | POA: Insufficient documentation

## 2018-02-23 DIAGNOSIS — T7840XA Allergy, unspecified, initial encounter: Secondary | ICD-10-CM | POA: Diagnosis present

## 2018-02-23 DIAGNOSIS — T782XXA Anaphylactic shock, unspecified, initial encounter: Secondary | ICD-10-CM

## 2018-02-23 DIAGNOSIS — J45909 Unspecified asthma, uncomplicated: Secondary | ICD-10-CM | POA: Diagnosis not present

## 2018-02-23 MED ORDER — FAMOTIDINE IN NACL 20-0.9 MG/50ML-% IV SOLN
20.0000 mg | Freq: Once | INTRAVENOUS | Status: AC
  Administered 2018-02-23: 20 mg via INTRAVENOUS
  Filled 2018-02-23: qty 50

## 2018-02-23 MED ORDER — PREDNISONE 10 MG PO TABS: 40.0000 mg | ORAL_TABLET | Freq: Every day | ORAL | 0 refills | Status: AC

## 2018-02-23 MED ORDER — LACTATED RINGERS IV BOLUS
500.0000 mL | Freq: Once | INTRAVENOUS | Status: AC
  Administered 2018-02-23: 500 mL via INTRAVENOUS

## 2018-02-23 MED ORDER — EPINEPHRINE 0.3 MG/0.3ML IJ SOAJ
0.3000 mg | Freq: Once | INTRAMUSCULAR | Status: AC
  Administered 2018-02-23: 0.3 mg via INTRAMUSCULAR

## 2018-02-23 MED ORDER — EPINEPHRINE 0.3 MG/0.3ML IJ SOAJ: 0.3000 mg | Freq: Once | INTRAMUSCULAR | 1 refills | Status: AC

## 2018-02-23 MED ORDER — METHYLPREDNISOLONE SODIUM SUCC 125 MG IJ SOLR
125.0000 mg | Freq: Once | INTRAMUSCULAR | Status: AC
  Administered 2018-02-23: 125 mg via INTRAVENOUS
  Filled 2018-02-23: qty 2

## 2018-02-23 MED ORDER — EPINEPHRINE 0.3 MG/0.3ML IJ SOAJ
INTRAMUSCULAR | Status: AC
  Filled 2018-02-23: qty 0.3

## 2018-02-23 NOTE — ED Triage Notes (Signed)
Pt presents to ED after eating cheesecake with blueberries (no previous known allergy to these) with eyes almost swollen shut, hives spread diffusely over his body, itching, throat itchiness, SOB

## 2018-02-23 NOTE — ED Provider Notes (Addendum)
[facility] EMERGENCY DEPARTMENT Provider Note   CSN: [id number] Arrival date & time: 02/23/18  2034   History   Chief Complaint Chief Complaint  Patient presents with  . Allergic Reaction    HPI Casey Brennan is a 57 y.o. male.  HPI 57 year old male with history of HIV on antiretroviral therapy (CD4 >500), asthma, GERD, hypertension presents to the emergency department today for evaluation of allergic reaction.  States that he was eating blueberry cheesecake approximately 15 minutes prior to ED arrival and shortly after developed diffuse rash and pruritus as well as tingling to his tongue and throat and chest tightness.  States he has had similar allergic response to chocolate and sulfa drugs in the past.  Has never had anaphylaxis.  Has never required epinephrine in the past.  Has never required intubation for allergic reactions in the past.  He has been intubated once in the past for surgery and was uncomplicated.  Denies any chest pain at this time.  No shortness of breath.  Phonating clearly.  States he has eaten at [employer] before but never had this particular food item.  No other known exposures.  No history of CAD. Took 75mg  benadryl prior to arrival with no relief.  Past Medical History:  Diagnosis Date  . Arthritis    r knee   . Asthma    very rare  . Axillary lymphadenopathy   . Bronchitis   . Chronic anemia    normocytic  . Chronic folliculitis   . Elevated PSA   . Environmental and seasonal allergies   . GERD (gastroesophageal reflux disease)   . Gross hematuria   . H/O pericarditis    2010--  myopercarditis--  resolved  . HCAP (healthcare-associated pneumonia) 11/19/2014  . Headache(784.0)    HX SEVERE FRONTAL HA'S  . Hiatal hernia   . History of concussion    2012  &  2013  RESIDUAL HA'S --  RESOLVED  . History of gastric ulcer   . History of kidney stones   . History of MRSA infection 2010   infected boil  . HIV (human  immunodeficiency virus infection) ([place]) 1988  . Hypertension   . Internal hemorrhoids   . Lytic bone lesion of hip    WORK-UP DONE BY ONCOLOGIST DR HA --  NOT MALIGNANT  . Pneumonia    hx of  . Post concussion syndrome    resolved  . Prostate cancer ([place]) 04/25/13   gleason 3+3=6, volume 30 gm  . Sinusitis, chronic 05/14/2015  . Ulcer    hx of gastric  . Wears glasses     Patient Active Problem List   Diagnosis Date Noted  . Tension headache 11/12/2017  . Hepatitis B immune 06/06/2017  . Chronic pain of left wrist 06/06/2017  . Constipation 09/27/2016  . Primary osteoarthritis of right knee 08/01/2016  . Chronic renal insufficiency, stage 2 (mild) 05/22/2016  . Skin lesion of right upper extremity 02/22/2016  . Migraines 02/22/2016  . Low back pain 02/22/2016  . Osteoarthritis of right knee 01/11/2016  . Kidney stones 10/25/2015  . Sinusitis, chronic 05/14/2015  . Hereditary and idiopathic peripheral neuropathy 09/02/2014  . GERD (gastroesophageal reflux disease) 07/29/2014  . COPD GOLD III 07/22/2014  . Hx of primary hypertension 04/27/2014  . H/O prostate cancer   . Lytic bone lesion of hip 08/22/2012  . Cataract, nuclear 05/15/2012  . Asthma 01/23/2012  . Insomnia 05/11/2011  . HTN (hypertension) 04/17/2011  .  Mood disorder in conditions classified elsewhere 06/14/2009  . Human immunodeficiency virus (HIV) disease ([place]) 04/19/2009    Past Surgical History:  Procedure Laterality Date  . CARDIAC CATHETERIZATION  03-23-2009  DR COOPER   NORMAL CORONARY ARTERIES  . CHOLECYSTECTOMY N/A 11/06/2014   Procedure: LAPAROSCOPIC CHOLECYSTECTOMY WITH INTRAOPERATIVE CHOLANGIOGRAM;  Surgeon: Judeth Horn, MD;  Location: [place];  Service: General;  Laterality: N/A;  . COLONOSCOPY  12/26/2011   Procedure: COLONOSCOPY;  Surgeon: Lear Ng, MD;  Location: [facility];  Service: Endoscopy;  Laterality: N/A;  . COLONOSCOPY WITH PROPOFOL N/A 07/29/2014   Procedure: COLONOSCOPY  WITH PROPOFOL;  Surgeon: Lear Ng, MD;  Location: [place];  Service: Endoscopy;  Laterality: N/A;  . CYSTOSCOPY/RETROGRADE/URETEROSCOPY Bilateral 04/25/2013   Procedure: CYSTOSCOPY/ BILATERAL RETROGRADES; BLADDER BIOPSIES;  Surgeon: Alexis Frock, MD;  Location: [facility];  Service: Urology;  Laterality: Bilateral;  . DENTAL EXAMINATION UNDER ANESTHESIA    . ESOPHAGOGASTRODUODENOSCOPY  12/26/2011   Procedure: ESOPHAGOGASTRODUODENOSCOPY (EGD);  Surgeon: Lear Ng, MD;  Location: [facility];  Service: Endoscopy;  Laterality: N/A;  . ESOPHAGOGASTRODUODENOSCOPY (EGD) WITH PROPOFOL N/A 07/29/2014   Procedure: ESOPHAGOGASTRODUODENOSCOPY (EGD) WITH PROPOFOL;  Surgeon: Lear Ng, MD;  Location: [place];  Service: Endoscopy;  Laterality: N/A;  . EXCISION CHRONIC LEFT BREAST ABSCESS  09-21-2010  . EXCISIONAL BX LEFT BREAST MASS/  I  &  D LEFT BREAST ABSCESS  03-24-2009  . KNEE ARTHROSCOPY Right 1985  . left axilla biopsy  04/2013  . left hip biopsy  10/2012  . LYMPHADENECTOMY Bilateral 08/18/2013   Procedure: LYMPHADENECTOMY "PELVIC LYMPH NODE DISSECTION";  Surgeon: Alexis Frock, MD;  Location: [facility];  Service: Urology;  Laterality: Bilateral;  . PROSTATE BIOPSY N/A 04/25/2013   Procedure: BIOPSY TRANSRECTAL ULTRASONIC PROSTATE (TUBP);  Surgeon: Alexis Frock, MD;  Location: [facility];  Service: Urology;  Laterality: N/A;  . ROBOT ASSISTED LAPAROSCOPIC RADICAL PROSTATECTOMY N/A 08/18/2013   Procedure: ROBOTIC ASSISTED LAPAROSCOPIC RADICAL PROSTATECTOMY;  Surgeon: Alexis Frock, MD;  Location: [facility];  Service: Urology;  Laterality: N/A;  . TOTAL KNEE ARTHROPLASTY Right 08/01/2016   08/17/16  . TOTAL KNEE ARTHROPLASTY Right 08/01/2016   Procedure: TOTAL KNEE ARTHROPLASTY;  Surgeon: Renette Butters, MD;  Location: [place];  Service: Orthopedics;  Laterality: Right;  . UPPER LEG SOFT TISSUE BIOPSY Left 2012   lthigh         Home Medications    Prior to Admission medications   Medication Sig Start Date End Date Taking? Authorizing Provider  albuterol (PROVENTIL HFA;VENTOLIN HFA) 108 (90 Base) MCG/ACT inhaler Inhale 2 puffs into the lungs every 6 (six) hours as needed for wheezing or shortness of breath. 12/03/17  Yes Campbell Riches, MD  albuterol (PROVENTIL) (2.5 MG/3ML) 0.083% nebulizer solution Take 3 mLs (2.5 mg total) by nebulization every 6 (six) hours as needed for wheezing or shortness of breath. 05/22/16  Yes Campbell Riches, MD  ALPRAZolam Duanne Moron) 0.5 MG tablet TAKE 1 TABLET BY MOUTH AT BEDTIME AS NEEDED FOR ANXIETY Patient taking differently: Take 0.5 mg by mouth at bedtime as needed for anxiety.  11/12/17  Yes Dennie Bible, NP  aspirin EC 81 MG tablet Take 81 mg by mouth every Monday, Wednesday, and Friday.    Yes [provider]  darunavir (PREZISTA) 800 MG tablet TAKE 1 TABLET(800 MG) BY MOUTH DAILY Patient taking differently: Take 800 mg by mouth daily.  08/06/17  Yes Campbell Riches, MD  diclofenac sodium (  VOLTAREN) 1 % GEL APPLY 2 GRAMS TOPICALLY TO THE AFFECTED AREA TWICE DAILY AS NEEDED FOR PAIN Patient taking differently: Apply 2 g topically 2 (two) times daily as needed (pain).  06/06/17  Yes Campbell Riches, MD  emtricitabine-tenofovir (TRUVADA) 200-300 MG tablet Take 1 tablet by mouth daily. 08/31/17  Yes Campbell Riches, MD  gabapentin (NEURONTIN) 300 MG capsule Take 1 capsule (300 mg total) by mouth 4 (four) times daily. Patient taking differently: Take 300 mg by mouth 4 (four) times daily as needed (pain).  11/12/17  Yes Dennie Bible, NP  hydrOXYzine (ATARAX/VISTARIL) 25 MG tablet TAKE 1 TABLET(25 MG) BY MOUTH EVERY NIGHT Patient taking differently: Take 25 mg by mouth at bedtime.  05/14/17  Yes Dohmeier, Asencion Partridge, MD  ipratropium (ATROVENT) 0.02 % nebulizer solution USE 2.5 ML(0.5 MG) VIA NEBULIZER THREE TIMES DAILY AS NEEDED FOR WHEEZING OR  SHORTNESS OF BREATH Patient taking differently: Take 0.5 mg by nebulization 3 (three) times daily as needed for wheezing or shortness of breath.  11/02/16  Yes Jule Ser, DO  ISENTRESS 400 MG tablet TAKE 2 TABLETS(800 MG) BY MOUTH AT BEDTIME Patient taking differently: Take 800 mg by mouth at bedtime.  10/29/17  Yes Campbell Riches, MD  l-methylfolate-B6-B12 (METANX) 3-35-2 MG TABS tablet Take 1 tablet by mouth daily. 06/06/17  Yes Campbell Riches, MD  mometasone-formoterol [facility]) 200-5 MCG/ACT AERO Take 2 puffs first thing in am and then another 2 puffs about 12 hours later. Patient taking differently: Inhale 1 puff into the lungs every 12 (twelve) hours.  07/22/14  Yes Tanda Rockers, MD  montelukast (SINGULAIR) 10 MG tablet TAKE 1 TABLET(10 MG) BY MOUTH AT BEDTIME Patient taking differently: Take 10 mg by mouth at bedtime.  05/09/17  Yes Campbell Riches, MD  Multiple Vitamin (MULTIVITAMIN WITH MINERALS) TABS tablet Take 1 tablet by mouth daily after breakfast.    Yes [provider]  mupirocin ointment (BACTROBAN) 2 % APPLY TO THE AFFECTED AREA TWICE DAILY FOR 2 WEEKS, PLACE COTTON BETWEEN TOES TO ALLOW BETTER AERATION AS NEEDED Patient taking differently: Apply 1 application topically 2 (two) times daily as needed (toe infection). PLACE COTTON BETWEEN TOES TO ALLOW BETTER AERATION 06/06/17  Yes Campbell Riches, MD  NORVIR 100 MG TABS tablet Take 1 tablet (100 mg total) by mouth at bedtime. 08/06/17  Yes Campbell Riches, MD  omeprazole (PRILOSEC) 40 MG capsule TAKE 1 CAPSULE(40 MG) BY MOUTH EVERY MORNING Patient taking differently: Take 20 mg by mouth 2 (two) times daily.  05/09/17  Yes Pyrtle, Lajuan Lines, MD  ondansetron (ZOFRAN ODT) 8 MG disintegrating tablet Take 1 tablet (8 mg total) by mouth every 8 (eight) hours as needed for nausea or vomiting. 06/06/17  Yes Campbell Riches, MD  Polyethyl Glycol-Propyl Glycol (SYSTANE) 0.4-0.3 % GEL ophthalmic gel Place 1 application  into both eyes 2 (two) times daily.    Yes [provider]  SUMAtriptan (IMITREX) 100 MG tablet Take 1 tablet (100 mg total) by mouth every 2 (two) hours as needed for migraine. May repeat in 2 hours if headache persists or recurs. 12/04/17  Yes Campbell Riches, MD  tiZANidine (ZANAFLEX) 2 MG tablet TAKE 3 TABLETS BY MOUTH THREE TIMES DAILY AS NEEDED Patient taking differently: Take 6 mg by mouth every 8 (eight) hours as needed for muscle spasms.  11/12/17  Yes Dennie Bible, NP  zolpidem (AMBIEN) 5 MG tablet Take 1-2 tablets (5-10 mg total) by mouth  at bedtime as needed for sleep. 12/21/16  Yes Jule Ser, DO  EPINEPHrine 0.3 mg/0.3 mL IJ SOAJ injection Inject 0.3 mLs (0.3 mg total) into the muscle once for 1 dose. 02/23/18 02/23/18  Corrie Dandy, MD  losartan (COZAAR) 25 MG tablet Take 1 tablet (25 mg total) by mouth daily. Patient not taking: Reported on 02/23/2018 11/02/16 11/02/17  Jule Ser, DO  predniSONE (DELTASONE) 10 MG tablet Take 4 tablets (40 mg total) by mouth daily for 3 days. 02/23/18 02/26/18  Corrie Dandy, MD    Family History Family History  Problem Relation Age of Onset  . Stroke Father   . Diabetes Father   . Cancer Father        brain cancer  . Asthma Father   . Hypertension Sister   . Cancer Maternal Uncle        prostate cancer  . Asthma Sister     Social History Social History   Tobacco Use  . Smoking status: Former Smoker    Packs/day: 1.50    Years: 23.00    Pack years: 34.50    Types: Cigars, Cigarettes    Last attempt to quit: 05/22/2010    Years since quitting: 7.7  . Smokeless tobacco: Never Used  Substance Use Topics  . Alcohol use: No    Alcohol/week: 0.0 standard drinks  . Drug use: No     Allergies   Chocolate; Descovy [emtricitabine-tenofovir af]; Genvoya [elviteg-cobic-emtricit-tenofaf]; Morphine and related; Penicillins; Sulfa antibiotics; Tomato; Vancomycin; Flagyl [metronidazole]; Toradol [ketorolac  tromethamine]; and Tramadol   Review of Systems Review of Systems  Constitutional: Negative for chills and fever.  HENT: Negative for congestion, mouth sores, sore throat, trouble swallowing and voice change.        "tongue itches" denies intraooral swelling or throat swelling  Eyes:       Swelling around bilat eyes  Respiratory: Negative for cough, shortness of breath, wheezing and stridor.   Cardiovascular: Negative for chest pain and leg swelling.  Gastrointestinal: Negative for abdominal pain, diarrhea, nausea and vomiting.  Genitourinary: Negative for dysuria and hematuria.  Musculoskeletal: Negative for back pain and neck pain.  Skin: Positive for rash (urticaria upper extremties and back).       Diffuse pruritus  Neurological: Negative for weakness, numbness and headaches.  All other systems reviewed and are negative.    Physical Exam Updated Vital Signs BP 132/83   Pulse 88   Temp (!) 97.5 F (36.4 C) (Oral)   Resp 17   SpO2 93%   Physical Exam  Constitutional: No distress.  HENT:  Head: Normocephalic and atraumatic.  No intraooral edema or swelling. Soft tongue. Uvula midline with no uvula edema. Symmetric posterior pharynx  Eyes: Conjunctivae are normal. Right eye exhibits no discharge. Left eye exhibits no discharge.  Periorbital edema bilat. Can open eyes  Neck: Normal range of motion. No tracheal deviation present.  Cardiovascular: Regular rhythm, normal heart sounds and intact distal pulses.  Pulmonary/Chest: Effort normal and breath sounds normal. No respiratory distress. He has no wheezes. He has no rales.  Moving air well bilaterally. CTAB. Speaking full sentences  Abdominal: Soft. Bowel sounds are normal. He exhibits no distension. There is no tenderness.  Musculoskeletal: He exhibits no edema or deformity.  Neurological: He is alert. He exhibits normal muscle tone.  Skin: No rash noted. He is not diaphoretic.  Scattered urticarial patches more  prominent upper back and bilat UEs.   Psychiatric: He has a normal mood and affect.  Nursing note and vitals reviewed.    ED Treatments / Results  Labs (all labs ordered are listed, but only abnormal results are displayed) Labs Reviewed - No data to display  EKG None  Radiology No results found.  Procedures Procedures (including critical care time)  Medications Ordered in ED Medications  EPINEPHrine (EPI-PEN) 0.3 mg/0.3 mL injection (has no administration in time range)  famotidine (PEPCID) IVPB 20 mg premix (0 mg Intravenous Stopped 02/23/18 2120)  methylPREDNISolone sodium succinate (SOLU-MEDROL) 125 mg/2 mL injection 125 mg (125 mg Intravenous Given 02/23/18 2048)  lactated ringers bolus 500 mL (0 mLs Intravenous Stopped 02/23/18 2225)  EPINEPHrine (EPI-PEN) injection 0.3 mg (0.3 mg Intramuscular Given 02/23/18 2043)     Initial Impression / Assessment and Plan / ED Course  I have reviewed the triage vital signs and the nursing notes.  Pertinent labs & imaging results that were available during my care of the patient were reviewed by me and considered in my medical decision making (see chart for details).    56 year old male with history of HIV on antiretroviral therapy (CD4 >500), asthma, GERD, hypertension presents to the emergency department today for evaluation of allergic reaction.    Patient afebrile, hemodynamically stable at presentation.  Tachycardic.  Exam as detailed above.  Does have facial swelling and chest tightness consistent with anaphylaxis after exposure to blueberry cheesecake which is new food for him.  Has never had anaphylaxis in the past.  He is tolerating secretions.  No oropharyngeal swelling.  Epinephrine, Solu-Medrol, famotidine given as well as IV fluids.  Received Benadryl just prior to arrival.  No labs indicated at this time given acute onset.  Shortly after receiving epinephrine patient reports complete resolution of symptoms.  No longer itching.   Rash resolved.  No longer has chest tightness.  I do not think this presentation is consistent with ACS and do not feel that any cardiac work-up is indicated at this time given associated with anaphylactic symptoms.  Patient monitored in the ED for several hours with no rebound of symptoms.  Feeling much better.  Swelling improved.  No further rash or itching.  Stable for discharge.  EpiPen prescribed.  Will discharge on prednisone and Benadryl.  Stable at discharge with sister.  Strict return precautions provided.  Case and plan of care discussed with Dr. Ashok Cordia.  Final Clinical Impressions(s) / ED Diagnoses   Final diagnoses:  Anaphylaxis, initial encounter    ED Discharge Orders         Ordered    EPINEPHrine 0.3 mg/0.3 mL IJ SOAJ injection   Once     02/23/18 2331    predniSONE (DELTASONE) 10 MG tablet  Daily     02/23/18 2331            Corrie Dandy, MD 02/23/18 2345    Lajean Saver, MD 02/24/18 [phone]

## 2018-02-23 NOTE — Discharge Instructions (Addendum)
Take prednisone as prescribed for the next 3 days.  Recommend Benadryl 25-50 mg every 6 or 8 hours as needed for itching and or if swelling develops again.  Do not drive or operate heavy machinery while taking this medication. If you develop difficulty breathing, throat swelling sensation, tongue swelling or other concerning symptoms administer epinephrine injection as detailed on packaging and as we have discussed and return to the emergency department if symptoms worsen or return.  If you require epinephrine in the future return immediately to the emergency department for reevaluation.  Keep EpiPen with you at all times and avoid any known allergens.

## 2018-02-23 NOTE — ED Notes (Signed)
Patient verbalizes understanding of discharge instructions. Opportunity for questioning and answers were provided. Armband removed by staff, pt discharged from ED with family.  

## 2018-02-23 NOTE — ED Notes (Signed)
Epi-pen given at 2042

## 2018-03-22 DIAGNOSIS — M25511 Pain in right shoulder: Secondary | ICD-10-CM | POA: Diagnosis not present

## 2018-03-22 DIAGNOSIS — M542 Cervicalgia: Secondary | ICD-10-CM | POA: Diagnosis not present

## 2018-03-22 DIAGNOSIS — M25551 Pain in right hip: Secondary | ICD-10-CM | POA: Diagnosis not present

## 2018-03-24 ENCOUNTER — Other Ambulatory Visit: Payer: Self-pay | Admitting: Neurology

## 2018-03-24 DIAGNOSIS — B20 Human immunodeficiency virus [HIV] disease: Secondary | ICD-10-CM

## 2018-03-24 DIAGNOSIS — G250 Essential tremor: Secondary | ICD-10-CM

## 2018-03-24 DIAGNOSIS — G63 Polyneuropathy in diseases classified elsewhere: Principal | ICD-10-CM

## 2018-03-24 DIAGNOSIS — G44229 Chronic tension-type headache, not intractable: Secondary | ICD-10-CM

## 2018-03-24 DIAGNOSIS — R252 Cramp and spasm: Secondary | ICD-10-CM

## 2018-03-26 ENCOUNTER — Other Ambulatory Visit: Payer: Self-pay | Admitting: Neurology

## 2018-04-02 ENCOUNTER — Telehealth: Payer: Self-pay | Admitting: Nurse Practitioner

## 2018-04-02 NOTE — Telephone Encounter (Signed)
Called and spoke to pharmacist at [facility],  He stated there was a prescription and will get ready for pt.   I LMVM for April, sister of pt and relayed this.  She is to call back if questions.

## 2018-04-02 NOTE — Telephone Encounter (Signed)
Pt sister is calling re: pt being denied a refill, his sister(on [place],April) states pt is out of this medication and is asking for a call to discuss it

## 2018-04-04 DIAGNOSIS — M722 Plantar fascial fibromatosis: Secondary | ICD-10-CM | POA: Diagnosis not present

## 2018-04-04 DIAGNOSIS — M7752 Other enthesopathy of left foot: Secondary | ICD-10-CM | POA: Diagnosis not present

## 2018-04-04 DIAGNOSIS — M7989 Other specified soft tissue disorders: Secondary | ICD-10-CM | POA: Diagnosis not present

## 2018-04-09 ENCOUNTER — Other Ambulatory Visit: Payer: Self-pay | Admitting: Infectious Diseases

## 2018-04-09 DIAGNOSIS — J449 Chronic obstructive pulmonary disease, unspecified: Secondary | ICD-10-CM

## 2018-04-10 ENCOUNTER — Other Ambulatory Visit: Payer: Self-pay | Admitting: *Deleted

## 2018-04-10 NOTE — Telephone Encounter (Signed)
Returned call to patient to advise that we can not make appointments for Dr [name] at this time but they are on the list for once we know his schedule. Also to let her know a refill for the Ventolin was called in yesterday and it should be ready to pick up. Had to leave a message for her to call the office will let her know once she does.

## 2018-04-11 ENCOUNTER — Telehealth: Payer: Self-pay | Admitting: *Deleted

## 2018-04-11 NOTE — Telephone Encounter (Signed)
Patient sister called to schedule an office visit for him but I had to explain that we are not able to schedule for Casey Brennan at this time but once we know his schedule we can call her back and schedule. She advise it has been a year and patient needs to be seen. He has had a pimply rash that will not go away and needs blood work. He gets labs same day due to his work schedule so offered her an appointment with the NP's. Patient is only able to come 04/24/18 so made him an appointment with Casey Madeira NP.

## 2018-04-19 DIAGNOSIS — N3000 Acute cystitis without hematuria: Secondary | ICD-10-CM | POA: Diagnosis not present

## 2018-04-23 DIAGNOSIS — H2513 Age-related nuclear cataract, bilateral: Secondary | ICD-10-CM | POA: Diagnosis not present

## 2018-04-24 ENCOUNTER — Ambulatory Visit (INDEPENDENT_AMBULATORY_CARE_PROVIDER_SITE_OTHER): Payer: Medicare Other | Admitting: Infectious Diseases

## 2018-04-24 ENCOUNTER — Encounter: Payer: Self-pay | Admitting: Infectious Diseases

## 2018-04-24 VITALS — BP 123/83 | HR 80 | Temp 98.0°F | Wt 214.0 lb

## 2018-04-24 DIAGNOSIS — I10 Essential (primary) hypertension: Secondary | ICD-10-CM | POA: Diagnosis not present

## 2018-04-24 DIAGNOSIS — R059 Cough, unspecified: Secondary | ICD-10-CM

## 2018-04-24 DIAGNOSIS — L989 Disorder of the skin and subcutaneous tissue, unspecified: Secondary | ICD-10-CM

## 2018-04-24 DIAGNOSIS — G43009 Migraine without aura, not intractable, without status migrainosus: Secondary | ICD-10-CM | POA: Diagnosis not present

## 2018-04-24 DIAGNOSIS — B20 Human immunodeficiency virus [HIV] disease: Secondary | ICD-10-CM

## 2018-04-24 DIAGNOSIS — Z0100 Encounter for examination of eyes and vision without abnormal findings: Secondary | ICD-10-CM

## 2018-04-24 DIAGNOSIS — G609 Hereditary and idiopathic neuropathy, unspecified: Secondary | ICD-10-CM

## 2018-04-24 DIAGNOSIS — R05 Cough: Secondary | ICD-10-CM

## 2018-04-24 MED ORDER — HYDROCOD POLST-CPM POLST ER 10-8 MG/5ML PO SUER: 5.0000 mL | Freq: Every evening | ORAL | 0 refills | Status: DC | PRN

## 2018-04-24 MED ORDER — EMTRICITABINE-TENOFOVIR DF 200-300 MG PO TABS: 1.0000 | ORAL_TABLET | Freq: Every day | ORAL | 3 refills | Status: DC

## 2018-04-24 MED ORDER — NORVIR 100 MG PO TABS: 100.0000 mg | ORAL_TABLET | Freq: Every day | ORAL | 1 refills | Status: DC

## 2018-04-24 MED ORDER — SUMATRIPTAN SUCCINATE 100 MG PO TABS: 100.0000 mg | ORAL_TABLET | ORAL | 2 refills | Status: DC | PRN

## 2018-04-24 MED ORDER — ISENTRESS 400 MG PO TABS: ORAL_TABLET | ORAL | 0 refills | Status: DC

## 2018-04-24 NOTE — Assessment & Plan Note (Addendum)
Under good control on current regimen - Refill sumatriptan

## 2018-04-24 NOTE — Assessment & Plan Note (Signed)
He has several hyperpigmented nodular lesions that have not changed in size/shape or texture over the last 10 months. They appear to be moles however will refer to dermatology as requested for evaluation since they are new.

## 2018-04-24 NOTE — Assessment & Plan Note (Signed)
Will check HIV labs today. He appears to be doing very well on his regimen. Will refill for him today. He can return in 6 months to see Dr. [name]. He has received flu shot already. Prevnar today and next pneumovax after 67th birthday.

## 2018-04-24 NOTE — Assessment & Plan Note (Signed)
Will give a refill on Tussionex considering his multiple allergies. I asked him to not take this when he drives only prior to sleep.

## 2018-04-24 NOTE — Patient Instructions (Signed)
We sent in the referral to your preferred Eye doctor and Dermatology with [facility].   Will give you some Tussin to help your cough. Please do not drive while you take this.   Please continue your Isentress twice a day and Truvada, Norvir, Prezista once a day.   Please come back to see Dr. [name] again in 6 months.

## 2018-04-24 NOTE — Progress Notes (Signed)
Name: Casey Brennan  DOB: 1961-05-23 MRN: [id number] PCP: Isabelle Course, MD   Patient Active Problem List   Diagnosis Date Noted  . Tension headache 11/12/2017  . Hepatitis B immune 06/06/2017  . Chronic pain of left wrist 06/06/2017  . Constipation 09/27/2016  . Primary osteoarthritis of right knee 08/01/2016  . Chronic renal insufficiency, stage 2 (mild) 05/22/2016  . Skin lesion of right lower extremity 02/22/2016  . Migraines 02/22/2016  . Low back pain 02/22/2016  . Osteoarthritis of right knee 01/11/2016  . Kidney stones 10/25/2015  . Sinusitis, chronic 05/14/2015  . Cough 01/18/2015  . Hereditary and idiopathic peripheral neuropathy 09/02/2014  . GERD (gastroesophageal reflux disease) 07/29/2014  . COPD GOLD III 07/22/2014  . Hx of primary hypertension 04/27/2014  . H/O prostate cancer   . Lytic bone lesion of hip 08/22/2012  . Cataract, nuclear 05/15/2012  . Asthma 01/23/2012  . Insomnia 05/11/2011  . HTN (hypertension) 04/17/2011  . Mood disorder in conditions classified elsewhere 06/14/2009  . Human immunodeficiency virus (HIV) disease ([place]) 04/19/2009     Subjective:  Casey Brennan is a 57 y.o. man with HIV disease here today for follow up. PMHx significant for COPD, prostate Ca s/p radical prostatectomy 08-18-13 >> adenocarcinoma with negative margins and negative lymph nodes.   He is a patient of Dr. Algis Downs and last seen in December 2018. He is accompanied by his sister today. He in the interval since last visit has had a few visits to the Emergency Room for allergic reactions (most recently to blueberries). He has done well on his regimen of Truvada + Prezista + Norvir + Isentress and no concerns for side effects of medications. He estimates 100% compliance with this regimen; apparently had an allergic reaction to descovy. 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 He has had a cough with yellow sputum for about a week  in the absence of any other symptoms; he is requesting tussin rx to help with cough as he is allergic to OTC cough suppressants.   He has a rash that started on the back of his arms and now on his legs. Small bumps have been present for 10 months now. They have not enlarged or changed to the best of his knowledge; they are non-pruritic/nonpainful and do not bleed. He would like to have dermatology referral. Also requesting referral to a specific eye doctor he has an established relationship with at [facility].   Review of Systems  Constitutional: Negative for chills, fever, malaise/fatigue and weight loss.  HENT: Negative for sore throat.        No dental problems  Respiratory: Negative for cough and sputum production.   Cardiovascular: Negative for chest pain and leg swelling.  Gastrointestinal: Negative for abdominal pain, diarrhea and vomiting.  Genitourinary: Negative for dysuria and flank pain.  Musculoskeletal: Negative for joint pain, myalgias and neck pain.  Skin: Positive for rash.  Neurological: Negative for dizziness, tingling and headaches.  Psychiatric/Behavioral: Negative for depression and substance abuse. The patient is not nervous/anxious and does not have insomnia.     Past Medical History:  Diagnosis Date  . Arthritis    r knee   . Asthma    very rare  . Axillary lymphadenopathy   . Bronchitis   . Chronic anemia    normocytic  . Chronic folliculitis   . Elevated PSA   . Environmental and seasonal allergies   .  GERD (gastroesophageal reflux disease)   . Gross hematuria   . H/O pericarditis    2010--  myopercarditis--  resolved  . HCAP (healthcare-associated pneumonia) 11/19/2014  . Headache(784.0)    HX SEVERE FRONTAL HA'S  . Hiatal hernia   . History of concussion    2012  &  2013  RESIDUAL HA'S --  RESOLVED  . History of gastric ulcer   . History of kidney stones   . History of MRSA infection 2010   infected boil  . HIV (human immunodeficiency virus  infection) ([place]) 1988  . Hypertension   . Internal hemorrhoids   . Lytic bone lesion of hip    WORK-UP DONE BY ONCOLOGIST DR HA --  NOT MALIGNANT  . Pneumonia    hx of  . Post concussion syndrome    resolved  . Prostate cancer ([place]) 04/25/13   gleason 3+3=6, volume 30 gm  . Sinusitis, chronic 05/14/2015  . Ulcer    hx of gastric  . Wears glasses     Outpatient Medications Prior to Visit  Medication Sig Dispense Refill  . ALPRAZolam (XANAX) 0.5 MG tablet TAKE 1 TABLET BY MOUTH AT BEDTIME AS NEEDED FOR ANXIETY (Patient taking differently: Take 0.5 mg by mouth at bedtime as needed for anxiety. ) 30 tablet 5  . aspirin EC 81 MG tablet Take 81 mg by mouth every Monday, Wednesday, and Friday.     . darunavir (PREZISTA) 800 MG tablet TAKE 1 TABLET(800 MG) BY MOUTH DAILY (Patient taking differently: Take 800 mg by mouth daily. ) 90 tablet 1  . diclofenac sodium (VOLTAREN) 1 % GEL APPLY 2 GRAMS TOPICALLY TO THE AFFECTED AREA TWICE DAILY AS NEEDED FOR PAIN (Patient taking differently: Apply 2 g topically 2 (two) times daily as needed (pain). ) 100 g 3  . gabapentin (NEURONTIN) 300 MG capsule Take 1 capsule (300 mg total) by mouth 4 (four) times daily. (Patient taking differently: Take 300 mg by mouth 4 (four) times daily as needed (pain). ) 120 capsule 11  . hydrOXYzine (ATARAX/VISTARIL) 25 MG tablet TAKE 1 TABLET(25 MG) BY MOUTH EVERY NIGHT 90 tablet 2  . ipratropium (ATROVENT) 0.02 % nebulizer solution USE 2.5 ML(0.5 MG) VIA NEBULIZER THREE TIMES DAILY AS NEEDED FOR WHEEZING OR SHORTNESS OF BREATH (Patient taking differently: Take 0.5 mg by nebulization 3 (three) times daily as needed for wheezing or shortness of breath. ) 1650 mL 1  . l-methylfolate-B6-B12 (METANX) 3-35-2 MG TABS tablet Take 1 tablet by mouth daily. 60 tablet 3  . metoprolol succinate (TOPROL-XL) 25 MG 24 hr tablet Take by mouth.    . mometasone-formoterol (DULERA) 200-5 MCG/ACT AERO Take 2 puffs first thing in am and then  another 2 puffs about 12 hours later. (Patient taking differently: Inhale 1 puff into the lungs every 12 (twelve) hours. ) 1 Inhaler 11  . montelukast (SINGULAIR) 10 MG tablet TAKE 1 TABLET(10 MG) BY MOUTH AT BEDTIME (Patient taking differently: Take 10 mg by mouth at bedtime. ) 30 tablet 5  . Multiple Vitamin (MULTIVITAMIN WITH MINERALS) TABS tablet Take 1 tablet by mouth daily after breakfast.     . mupirocin ointment (BACTROBAN) 2 % APPLY TO THE AFFECTED AREA TWICE DAILY FOR 2 WEEKS, PLACE COTTON BETWEEN TOES TO ALLOW BETTER AERATION AS NEEDED (Patient taking differently: Apply 1 application topically 2 (two) times daily as needed (toe infection). PLACE COTTON BETWEEN TOES TO ALLOW BETTER AERATION) 66 g 2  . omeprazole (PRILOSEC) 40 MG capsule TAKE 1  CAPSULE(40 MG) BY MOUTH EVERY MORNING 30 capsule 1  . ondansetron (ZOFRAN ODT) 8 MG disintegrating tablet Take 1 tablet (8 mg total) by mouth every 8 (eight) hours as needed for nausea or vomiting. 60 tablet 3  . Polyethyl Glycol-Propyl Glycol (SYSTANE) 0.4-0.3 % GEL ophthalmic gel Place 1 application into both eyes 2 (two) times daily.     [name] tiZANidine (ZANAFLEX) 2 MG tablet TAKE 3 TABLETS BY MOUTH THREE TIMES DAILY AS NEEDED (Patient taking differently: Take 6 mg by mouth every 8 (eight) hours as needed for muscle spasms. ) 810 tablet 3  . torsemide (DEMADEX) 20 MG tablet Take 20 mg by mouth daily.    . VENTOLIN HFA 108 (90 Base) MCG/ACT inhaler INHALE 2 PUFFS INTO THE LUNGS EVERY 6 HOURS AS NEEDED FOR WHEEZING OR SHORTNESS OF BREATH 36 g 0  . zolpidem (AMBIEN) 5 MG tablet Take 1-2 tablets (5-10 mg total) by mouth at bedtime as needed for sleep. 30 tablet 2  . emtricitabine-tenofovir (TRUVADA) 200-300 MG tablet Take 1 tablet by mouth daily. 90 tablet 3  . ISENTRESS 400 MG tablet TAKE 2 TABLETS(800 MG) BY MOUTH AT BEDTIME (Patient taking differently: Take 800 mg by mouth at bedtime. ) 90 tablet 0  . NORVIR 100 MG TABS tablet Take 1 tablet (100 mg total)  by mouth at bedtime. 90 tablet 1  . SUMAtriptan (IMITREX) 100 MG tablet Take 1 tablet (100 mg total) by mouth every 2 (two) hours as needed for migraine. May repeat in 2 hours if headache persists or recurs. 10 tablet 2  . losartan (COZAAR) 25 MG tablet Take 1 tablet (25 mg total) by mouth daily. (Patient not taking: Reported on 02/23/2018) 30 tablet 11   No facility-administered medications prior to visit.      Allergies  Allergen Reactions  . Chocolate Shortness Of Breath and Other (See Comments)    Asthma attack, welts  . Descovy [Emtricitabine-Tenofovir Af] Shortness Of Breath    Nasuea, vomiiting, diarrhea, sob, whelps, throat closes  . Genvoya [Elviteg-Cobic-Emtricit-Tenofaf] Shortness Of Breath    Nausea, vomitting, diarreha, sob, rash, throat closing  . Morphine And Related Shortness Of Breath, Itching and Other (See Comments)     chest pain  . Penicillins Shortness Of Breath and Rash    Has patient had a PCN reaction causing immediate rash, facial/tongue/throat swelling, SOB or lightheadedness with hypotension: Yes Has patient had a PCN reaction causing severe rash involving mucus membranes or skin necrosis: No Has patient had a PCN reaction that required hospitalization No Has patient had a PCN reaction occurring within the last 10 years: Yes If all of the above answers are "NO", then may proceed with Cephalosporin use.  . Sulfa Antibiotics Anaphylaxis  . Tomato Shortness Of Breath  . Vancomycin Shortness Of Breath  . Flagyl [Metronidazole] Itching  . Toradol [Ketorolac Tromethamine] Nausea And Vomiting    itching  . Tramadol Itching and Nausea And Vomiting    Social History   Tobacco Use  . Smoking status: Former Smoker    Packs/day: 1.50    Years: 23.00    Pack years: 34.50    Types: Cigars, Cigarettes    Last attempt to quit: 05/22/2010    Years since quitting: 7.9  . Smokeless tobacco: Never Used  Substance Use Topics  . Alcohol use: No    Alcohol/week: 0.0  standard drinks  . Drug use: No    Family History  Problem Relation Age of Onset  . Stroke Father   .  Diabetes Father   . Cancer Father        brain cancer  . Asthma Father   . Hypertension Sister   . Cancer Maternal Uncle        prostate cancer  . Asthma Sister     Social History   Substance and Sexual Activity  Sexual Activity Not Currently  . Birth control/protection: Condom   Comment: pt. declined condoms    Objective:   Vitals:   04/24/18 1617  BP: 123/83  Pulse: 80  Temp: 98 F (36.7 C)  Weight: 214 lb (97.1 kg)   Body mass index is 31.6 kg/m.  Physical Exam  Constitutional: He is oriented to person, place, and time.  HENT:  Mouth/Throat: No oral lesions. Normal dentition. No dental caries.  Eyes: No scleral icterus.  Cardiovascular: Normal rate, regular rhythm and normal heart sounds.  Pulmonary/Chest: Effort normal and breath sounds normal.  Abdominal: Soft. He exhibits no distension. There is no tenderness.  Lymphadenopathy:    He has no cervical adenopathy.  Neurological: He is alert and oriented to person, place, and time.  Skin: Skin is warm and dry. No rash noted.  R shin - two brown nodules; firm; circular and symmetrical boarders; monochromatic. Scaling around one lesion. No ulceration. ~0.25 cm in diameter with largest.  L posterior upper arm - same as above but there are 3 areas present.   Vitals reviewed.   Lab Results Lab Results  Component Value Date   WBC 5.9 06/06/2017   HGB 13.8 06/06/2017   HCT 42.0 06/06/2017   MCV 94.6 06/06/2017   PLT 173 06/06/2017    Lab Results  Component Value Date   CREATININE 1.13 06/06/2017   BUN 9 06/06/2017   NA 137 06/06/2017   K 3.8 06/06/2017   CL 104 06/06/2017   CO2 25 06/06/2017    Lab Results  Component Value Date   ALT 14 06/06/2017   AST 17 06/06/2017   ALKPHOS 99 04/26/2017   BILITOT 0.8 06/06/2017    Lab Results  Component Value Date   CHOL 143 06/06/2017   HDL 54  06/06/2017   LDLCALC 75 06/06/2017   TRIG 68 06/06/2017   CHOLHDL 2.6 06/06/2017   HIV 1 RNA Quant  Date Value  06/06/2017 <20 Copies/mL  04/20/2016 <20 copies/mL  07/27/2015 <20 copies/mL   CD4 T Cell Abs (/uL)  Date Value  06/06/2017 530  04/20/2016 560  07/27/2015 450    Assessment & Plan:   Problem List Items Addressed This Visit      Unprioritized   Cough    Will give a refill on Tussionex considering his multiple allergies. I asked him to not take this when he drives only prior to sleep.       Hereditary and idiopathic peripheral neuropathy   Relevant Medications   emtricitabine-tenofovir (TRUVADA) 200-300 MG tablet   Human immunodeficiency virus (HIV) disease ([place]) - Primary    Will check HIV labs today. He appears to be doing very well on his regimen. Will refill for him today. He can return in 6 months to see Dr. [name]. He has received flu shot already. Prevnar today and next pneumovax after 67th birthday.       Relevant Medications   NORVIR 100 MG TABS tablet   ISENTRESS 400 MG tablet   emtricitabine-tenofovir (TRUVADA) 200-300 MG tablet   Other Relevant Orders   HIV-1 RNA quant-no reflex-bld   T-helper cell (CD4)- ([facility] clinic only)  RPR   COMPLETE METABOLIC PANEL WITH GFR   CBC with Differential/Platelet   Lipid panel   Urinalysis   Migraines    Under good control on current regimen - Refill sumatriptan       Relevant Medications   metoprolol succinate (TOPROL-XL) 25 MG 24 hr tablet   torsemide (DEMADEX) 20 MG tablet   SUMAtriptan (IMITREX) 100 MG tablet   Skin lesion of right lower extremity    He has several hyperpigmented nodular lesions that have not changed in size/shape or texture over the last 10 months. They appear to be moles however will refer to dermatology as requested for evaluation since they are new.        Other Visit Diagnoses    Encounter for complete eye exam       Relevant Orders   Ambulatory referral to Ophthalmology    Skin lesion       Relevant Orders   Ambulatory referral to Dermatology      Janene Madeira, MSN, NP-C [facility] Pager: [phone] Office: [phone]  04/24/18  5:33 PM

## 2018-04-25 ENCOUNTER — Encounter: Payer: Self-pay | Admitting: Infectious Diseases

## 2018-04-25 ENCOUNTER — Other Ambulatory Visit: Payer: Self-pay | Admitting: Infectious Diseases

## 2018-04-25 DIAGNOSIS — B20 Human immunodeficiency virus [HIV] disease: Secondary | ICD-10-CM

## 2018-04-25 DIAGNOSIS — L600 Ingrowing nail: Secondary | ICD-10-CM | POA: Diagnosis not present

## 2018-04-26 DIAGNOSIS — M25571 Pain in right ankle and joints of right foot: Secondary | ICD-10-CM | POA: Diagnosis not present

## 2018-04-26 LAB — COMPLETE METABOLIC PANEL WITH GFR
AG Ratio: 1.6 (calc) (ref 1.0–2.5)
ALT: 17 U/L (ref 9–46)
AST: 15 U/L (ref 10–35)
Albumin: 4.1 g/dL (ref 3.6–5.1)
Alkaline phosphatase (APISO): 91 U/L (ref 40–115)
BUN: 16 mg/dL (ref 7–25)
CO2: 29 mmol/L (ref 20–32)
Calcium: 8.9 mg/dL (ref 8.6–10.3)
Chloride: 107 mmol/L (ref 98–110)
Creat: 1.32 mg/dL (ref 0.70–1.33)
GFR, Est African American: 70 mL/min/{1.73_m2} (ref >=60)
GFR, Est Non African American: 61 mL/min/{1.73_m2} (ref >=60)
Globulin: 2.5 g/dL (calc) (ref 1.9–3.7)
Glucose, Bld: 92 mg/dL (ref 65–99)
Potassium: 3.8 mmol/L (ref 3.5–5.3)
Sodium: 140 mmol/L (ref 135–146)
Total Bilirubin: 0.5 mg/dL (ref 0.2–1.2)
Total Protein: 6.6 g/dL (ref 6.1–8.1)

## 2018-04-26 LAB — CBC WITH DIFFERENTIAL/PLATELET
Basophils Absolute: 38 cells/uL (ref 0–200)
Basophils Relative: 0.6 %
Eosinophils Absolute: 239 cells/uL (ref 15–500)
Eosinophils Relative: 3.8 %
HCT: 39.3 % (ref 38.5–50.0)
Hemoglobin: 13.5 g/dL (ref 13.2–17.1)
Lymphs Abs: 2596 cells/uL (ref 850–3900)
MCH: 33.6 pg — ABNORMAL HIGH (ref 27.0–33.0)
MCHC: 34.4 g/dL (ref 32.0–36.0)
MCV: 97.8 fL (ref 80.0–100.0)
MPV: 12.8 fL — ABNORMAL HIGH (ref 7.5–12.5)
Monocytes Relative: 9.5 %
Neutro Abs: 2829 cells/uL (ref 1500–7800)
Neutrophils Relative %: 44.9 %
Platelets: 186 10*3/uL (ref 140–400)
RBC: 4.02 10*6/uL — ABNORMAL LOW (ref 4.20–5.80)
RDW: 13.8 % (ref 11.0–15.0)
Total Lymphocyte: 41.2 %
WBC mixed population: 599 cells/uL (ref 200–950)
WBC: 6.3 10*3/uL (ref 3.8–10.8)

## 2018-04-26 LAB — LIPID PANEL
Cholesterol: 173 mg/dL (ref <200)
HDL: 63 mg/dL (ref >40)
LDL Cholesterol (Calc): 92 mg/dL (calc)
Non-HDL Cholesterol (Calc): 110 mg/dL (calc) (ref <130)
Total CHOL/HDL Ratio: 2.7 (calc) (ref <5.0)
Triglycerides: 85 mg/dL (ref <150)

## 2018-04-26 LAB — HIV-1 RNA QUANT-NO REFLEX-BLD
HIV 1 RNA Quant: <20 copies/mL
HIV-1 RNA Quant, Log: <1.3 Log copies/mL

## 2018-04-26 LAB — T-HELPER CELL (CD4) - (RCID CLINIC ONLY)
CD4 % Helper T Cell: 24 % — ABNORMAL LOW (ref 33–55)
CD4 T Cell Abs: 680 /uL (ref 400–2700)

## 2018-04-26 LAB — RPR: RPR Ser Ql: NONREACTIVE

## 2018-04-30 ENCOUNTER — Other Ambulatory Visit: Payer: Self-pay | Admitting: Family

## 2018-04-30 ENCOUNTER — Telehealth: Payer: Self-pay

## 2018-04-30 MED ORDER — HYDROCOD POLST-CPM POLST ER 10-8 MG/5ML PO SUER: 5.0000 mL | Freq: Every evening | ORAL | 0 refills | Status: DC | PRN

## 2018-04-30 NOTE — Telephone Encounter (Signed)
Patient's sister called with complaint of Tussionex script did not present to pharmacy electronically.   I checked with the pharmacy to see if script was received. It was not received.   New script sent electronically by Terri Piedra.   Laverle Patter, RN

## 2018-05-10 DIAGNOSIS — L600 Ingrowing nail: Secondary | ICD-10-CM | POA: Diagnosis not present

## 2018-05-11 DIAGNOSIS — M542 Cervicalgia: Secondary | ICD-10-CM | POA: Diagnosis not present

## 2018-05-18 ENCOUNTER — Encounter (HOSPITAL_COMMUNITY): Payer: Self-pay | Admitting: Emergency Medicine

## 2018-05-18 ENCOUNTER — Emergency Department (HOSPITAL_COMMUNITY): Payer: Medicare Other | Admitting: Certified Registered Nurse Anesthetist

## 2018-05-18 ENCOUNTER — Emergency Department (HOSPITAL_COMMUNITY)
Admission: EM | Admit: 2018-05-18 | Discharge: 2018-05-18 | Disposition: A | Discharge status: Home / Self Care | Payer: Medicare Other | Attending: Emergency Medicine | Admitting: Emergency Medicine

## 2018-05-18 ENCOUNTER — Encounter (HOSPITAL_COMMUNITY)
Admission: EM | Disposition: A | Discharge status: Home / Self Care | Payer: Self-pay | Source: Home / Self Care | Attending: Emergency Medicine

## 2018-05-18 DIAGNOSIS — Z8614 Personal history of Methicillin resistant Staphylococcus aureus infection: Secondary | ICD-10-CM | POA: Diagnosis not present

## 2018-05-18 DIAGNOSIS — R339 Retention of urine, unspecified: Secondary | ICD-10-CM | POA: Diagnosis not present

## 2018-05-18 DIAGNOSIS — G4700 Insomnia, unspecified: Secondary | ICD-10-CM | POA: Diagnosis not present

## 2018-05-18 DIAGNOSIS — Z888 Allergy status to other drugs, medicaments and biological substances status: Secondary | ICD-10-CM | POA: Insufficient documentation

## 2018-05-18 DIAGNOSIS — F17290 Nicotine dependence, other tobacco product, uncomplicated: Secondary | ICD-10-CM | POA: Insufficient documentation

## 2018-05-18 DIAGNOSIS — G609 Hereditary and idiopathic neuropathy, unspecified: Secondary | ICD-10-CM | POA: Diagnosis not present

## 2018-05-18 DIAGNOSIS — Z79899 Other long term (current) drug therapy: Secondary | ICD-10-CM | POA: Diagnosis not present

## 2018-05-18 DIAGNOSIS — Z885 Allergy status to narcotic agent status: Secondary | ICD-10-CM | POA: Insufficient documentation

## 2018-05-18 DIAGNOSIS — K219 Gastro-esophageal reflux disease without esophagitis: Secondary | ICD-10-CM | POA: Diagnosis not present

## 2018-05-18 DIAGNOSIS — B20 Human immunodeficiency virus [HIV] disease: Secondary | ICD-10-CM | POA: Insufficient documentation

## 2018-05-18 DIAGNOSIS — T190XXA Foreign body in urethra, initial encounter: Secondary | ICD-10-CM | POA: Insufficient documentation

## 2018-05-18 DIAGNOSIS — Z881 Allergy status to other antibiotic agents status: Secondary | ICD-10-CM | POA: Insufficient documentation

## 2018-05-18 DIAGNOSIS — M1711 Unilateral primary osteoarthritis, right knee: Secondary | ICD-10-CM | POA: Insufficient documentation

## 2018-05-18 DIAGNOSIS — J449 Chronic obstructive pulmonary disease, unspecified: Secondary | ICD-10-CM | POA: Insufficient documentation

## 2018-05-18 DIAGNOSIS — X58XXXA Exposure to other specified factors, initial encounter: Secondary | ICD-10-CM | POA: Diagnosis not present

## 2018-05-18 DIAGNOSIS — Z7982 Long term (current) use of aspirin: Secondary | ICD-10-CM | POA: Diagnosis not present

## 2018-05-18 DIAGNOSIS — I10 Essential (primary) hypertension: Secondary | ICD-10-CM | POA: Diagnosis not present

## 2018-05-18 DIAGNOSIS — Z8719 Personal history of other diseases of the digestive system: Secondary | ICD-10-CM | POA: Insufficient documentation

## 2018-05-18 DIAGNOSIS — Z880 Allergy status to penicillin: Secondary | ICD-10-CM | POA: Insufficient documentation

## 2018-05-18 DIAGNOSIS — I129 Hypertensive chronic kidney disease with stage 1 through stage 4 chronic kidney disease, or unspecified chronic kidney disease: Secondary | ICD-10-CM | POA: Diagnosis not present

## 2018-05-18 DIAGNOSIS — N182 Chronic kidney disease, stage 2 (mild): Secondary | ICD-10-CM | POA: Diagnosis not present

## 2018-05-18 DIAGNOSIS — Z8546 Personal history of malignant neoplasm of prostate: Secondary | ICD-10-CM | POA: Diagnosis not present

## 2018-05-18 DIAGNOSIS — Z87442 Personal history of urinary calculi: Secondary | ICD-10-CM | POA: Diagnosis not present

## 2018-05-18 DIAGNOSIS — N35919 Unspecified urethral stricture, male, unspecified site: Secondary | ICD-10-CM | POA: Insufficient documentation

## 2018-05-18 DIAGNOSIS — Z882 Allergy status to sulfonamides status: Secondary | ICD-10-CM | POA: Insufficient documentation

## 2018-05-18 DIAGNOSIS — M545 Low back pain: Secondary | ICD-10-CM | POA: Diagnosis not present

## 2018-05-18 DIAGNOSIS — F419 Anxiety disorder, unspecified: Secondary | ICD-10-CM | POA: Insufficient documentation

## 2018-05-18 HISTORY — PX: CYSTOSCOPY: SHX5120

## 2018-05-18 SURGERY — CYSTOSCOPY
Anesthesia: General

## 2018-05-18 MED ORDER — LACTATED RINGERS IV SOLN: INTRAVENOUS | Status: DC

## 2018-05-18 MED ORDER — MEPERIDINE HCL 50 MG/ML IJ SOLN: 6.2500 mg | INTRAMUSCULAR | Status: DC | PRN

## 2018-05-18 MED ORDER — CIPROFLOXACIN IN D5W 400 MG/200ML IV SOLN
INTRAVENOUS | Status: DC | PRN
  Administered 2018-05-18: 400 mg via INTRAVENOUS

## 2018-05-18 MED ORDER — MIDAZOLAM HCL 2 MG/2ML IJ SOLN
INTRAMUSCULAR | Status: AC
  Filled 2018-05-18: qty 2

## 2018-05-18 MED ORDER — CIPROFLOXACIN HCL 500 MG PO TABS: 500.0000 mg | ORAL_TABLET | Freq: Two times a day (BID) | ORAL | 0 refills | Status: DC

## 2018-05-18 MED ORDER — LIDOCAINE 2% (20 MG/ML) 5 ML SYRINGE
INTRAMUSCULAR | Status: DC | PRN
  Administered 2018-05-18: 100 mg via INTRAVENOUS

## 2018-05-18 MED ORDER — LIDOCAINE 2% (20 MG/ML) 5 ML SYRINGE
INTRAMUSCULAR | Status: AC
  Filled 2018-05-18: qty 5

## 2018-05-18 MED ORDER — HYDROCODONE-ACETAMINOPHEN 10-325 MG PO TABS: 1.0000 | ORAL_TABLET | ORAL | 0 refills | Status: DC | PRN

## 2018-05-18 MED ORDER — MIDAZOLAM HCL 5 MG/5ML IJ SOLN
INTRAMUSCULAR | Status: DC | PRN
  Administered 2018-05-18: 2 mg via INTRAVENOUS

## 2018-05-18 MED ORDER — PROPOFOL 10 MG/ML IV BOLUS
INTRAVENOUS | Status: AC
  Filled 2018-05-18: qty 20

## 2018-05-18 MED ORDER — ONDANSETRON HCL 4 MG/2ML IJ SOLN
INTRAMUSCULAR | Status: DC | PRN
  Administered 2018-05-18: 4 mg via INTRAVENOUS

## 2018-05-18 MED ORDER — STERILE WATER FOR IRRIGATION IR SOLN
Status: DC | PRN
  Administered 2018-05-18: 3000 mL

## 2018-05-18 MED ORDER — HYDROMORPHONE HCL 1 MG/ML IJ SOLN: 0.2500 mg | INTRAMUSCULAR | Status: DC | PRN

## 2018-05-18 MED ORDER — LACTATED RINGERS IV SOLN
INTRAVENOUS | Status: DC | PRN
  Administered 2018-05-18: 20:00:00 via INTRAVENOUS

## 2018-05-18 MED ORDER — FENTANYL CITRATE (PF) 100 MCG/2ML IJ SOLN
INTRAMUSCULAR | Status: AC
  Filled 2018-05-18: qty 2

## 2018-05-18 MED ORDER — FENTANYL CITRATE (PF) 100 MCG/2ML IJ SOLN
INTRAMUSCULAR | Status: DC | PRN
  Administered 2018-05-18: 50 ug via INTRAVENOUS

## 2018-05-18 MED ORDER — PROPOFOL 10 MG/ML IV BOLUS
INTRAVENOUS | Status: DC | PRN
  Administered 2018-05-18: 140 mg via INTRAVENOUS

## 2018-05-18 MED ORDER — CIPROFLOXACIN IN D5W 400 MG/200ML IV SOLN
INTRAVENOUS | Status: AC
  Filled 2018-05-18: qty 200

## 2018-05-18 MED ORDER — ONDANSETRON HCL 4 MG/2ML IJ SOLN: 4.0000 mg | Freq: Once | INTRAMUSCULAR | Status: DC | PRN

## 2018-05-18 MED ORDER — ONDANSETRON HCL 4 MG/2ML IJ SOLN
INTRAMUSCULAR | Status: AC
  Filled 2018-05-18: qty 2

## 2018-05-18 SURGICAL SUPPLY — 12 items
BAG URO CATCHER STRL LF (MISCELLANEOUS) ×2 IMPLANT
CATH INTERMIT  6FR 70CM (CATHETERS) ×2 IMPLANT
CLOTH BEACON ORANGE TIMEOUT ST (SAFETY) ×2 IMPLANT
COVER WAND RF STERILE (DRAPES) IMPLANT
GLOVE BIOGEL M 8.0 STRL (GLOVE) ×2 IMPLANT
GOWN STRL REUS W/ TWL XL LVL3 (GOWN DISPOSABLE) ×1 IMPLANT
GOWN STRL REUS W/TWL XL LVL3 (GOWN DISPOSABLE) ×2
GUIDEWIRE STR DUAL SENSOR (WIRE) ×2 IMPLANT
MANIFOLD NEPTUNE II (INSTRUMENTS) ×2 IMPLANT
PACK CYSTO (CUSTOM PROCEDURE TRAY) ×2 IMPLANT
TRAY FOLEY MTR SLVR 16FR STAT (SET/KITS/TRAYS/PACK) ×1 IMPLANT
TUBING CONNECTING 10 (TUBING) ×2 IMPLANT

## 2018-05-18 NOTE — Anesthesia Postprocedure Evaluation (Signed)
Anesthesia Post Note  Patient: Casey Brennan  Procedure(s) Performed: CYSTOSCOPY REMOVAL FOREIGN BODY (N/A )     Patient location during evaluation: PACU Anesthesia Type: General Level of consciousness: awake and alert Pain management: pain level controlled Vital Signs Assessment: post-procedure vital signs reviewed and stable Respiratory status: spontaneous breathing, nonlabored ventilation, respiratory function stable and patient connected to nasal cannula oxygen Cardiovascular status: blood pressure returned to baseline and stable Postop Assessment: no apparent nausea or vomiting Anesthetic complications: no    Last Vitals:  Vitals:   05/18/18 1640 05/18/18 2041  BP: (!) 179/113 (!) 137/94  Pulse: 85 73  Resp: 14 17  Temp: 36.9 C (!) 36.4 C  SpO2: 91% 99%    Last Pain:  Vitals:   05/18/18 2041  TempSrc:   PainSc: Thornton DAVID

## 2018-05-18 NOTE — Anesthesia Procedure Notes (Signed)
Procedure Name: LMA Insertion Date/Time: 05/18/2018 8:13 PM Performed by: Montel Clock, CRNA Pre-anesthesia Checklist: Patient identified, Emergency Drugs available, Suction available, Patient being monitored and Timeout performed Patient Re-evaluated:Patient Re-evaluated prior to induction Oxygen Delivery Method: Circle system utilized Preoxygenation: Pre-oxygenation with 100% oxygen Induction Type: IV induction LMA: LMA with gastric port inserted LMA Size: 4.0 Number of attempts: 1 Dental Injury: Teeth and Oropharynx as per pre-operative assessment

## 2018-05-18 NOTE — Anesthesia Preprocedure Evaluation (Signed)
Anesthesia Evaluation  Patient identified by MRN, date of birth, ID band Patient awake    Reviewed: Allergy & Precautions, NPO status , Patient's Chart, lab work & pertinent test results  Airway Mallampati: II  TM Distance: >3 FB Neck ROM: Full    Dental   Pulmonary former smoker,    Pulmonary exam normal        Cardiovascular hypertension, Pt. on medications Normal cardiovascular exam     Neuro/Psych Dementia    GI/Hepatic GERD  Medicated and Controlled,  Endo/Other    Renal/GU      Musculoskeletal   Abdominal   Peds  Hematology   Anesthesia Other Findings   Reproductive/Obstetrics                             Anesthesia Physical Anesthesia Plan  ASA: III  Anesthesia Plan: General   Post-op Pain Management:    Induction: Intravenous  PONV Risk Score and Plan: 2 and Ondansetron and Treatment may vary due to age or medical condition  Airway Management Planned: LMA  Additional Equipment:   Intra-op Plan:   Post-operative Plan: Extubation in OR  Informed Consent: I have reviewed the patients History and Physical, chart, labs and discussed the procedure including the risks, benefits and alternatives for the proposed anesthesia with the patient or authorized representative who has indicated his/her understanding and acceptance.     Plan Discussed with: CRNA and Surgeon  Anesthesia Plan Comments:         Anesthesia Quick Evaluation

## 2018-05-18 NOTE — ED Triage Notes (Signed)
Patient here from home with complaints of urinary retention. States that he self-caths and a piece of the catheter is lodged in penis.

## 2018-05-18 NOTE — Op Note (Signed)
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  Since that time he has developed significant urgency and small frequent voidings.  He was found in the emergency room to have a foreign body wedged in the deep bulbar urethra with what appeared to be possible stricture versus urethral trauma from the foreign body in the proximal bulbar urethra and a red foreign body lodged in the deep bulbar urethra.  An attempt at cystoscopic removal of this foreign body in the emergency room with the flexible cystoscope was unsuccessful.  He therefore is brought to the operating room for attempts at removal under anesthesia using rigid cystoscopy.  He does understand the possible need for an open procedure in order to remove his foreign body if I am unsuccessful removing it cystoscopically.  ANESTHESIA:  General  EBL:  Minimal  DRAINS: 16 French Foley catheter  LOCAL MEDICATIONS USED:  None  SPECIMEN: None  Description of procedure: After informed consent the patient was taken to the operating room and placed on the table in a supine position. General anesthesia was then administered. Once fully anesthetized the patient was moved to the dorsal lithotomy position and the genitalia were sterilely prepped and draped in standard fashion. An official timeout was then performed.  The 23 French cystoscope was passed down the urethra with a 30 degree lens.  I had affixed the biopsy forceps as well as these were the most rigid grasping instrument.  I was able to identify the foreign body.  It was grasped with the biopsy forcep and using a gentle but constant pressure and a twisting motion I was able to gently advance the foreign body back  down the urethra and out.  I then performed cystoscopy and evaluated the urethra and it appeared to be intact.  The prostate was intact as well and I entered the bladder and noted no lesions or foreign bodies within the bladder.  The cystoscope was then removed, a 14 [place] Foley catheter was inserted and the patient was awakened and taken to the recovery room in stable and satisfactory condition.  He tolerated procedure well with no intraoperative complications.  PLAN OF CARE: Discharge to home after PACU  PATIENT DISPOSITION:  PACU - hemodynamically stable.

## 2018-05-18 NOTE — Progress Notes (Signed)
Reviewed D/C instructions with patient and Sister, [name]. Reviewed general anesthesia instructions with patient and sister. Reviewed D/C instructions and gave Rx to sister, April to be filled. Also, gave instructions where to have Rx filled at the 24 hour pharmacy if needed. Patient and sister verbally acknowledged instructions.

## 2018-05-18 NOTE — Transfer of Care (Signed)
Immediate Anesthesia Transfer of Care Note  Patient: Casey Brennan  Procedure(s) Performed: CYSTOSCOPY REMOVAL FOREIGN BODY (N/A )  Patient Location: PACU  Anesthesia Type:General  Level of Consciousness: drowsy  Airway & Oxygen Therapy: Patient Spontanous Breathing and Patient connected to face mask oxygen  Post-op Assessment: Report given to RN and Post -op Vital signs reviewed and stable  Post vital signs: Reviewed and stable  Last Vitals:  Vitals Value Taken Time  BP    Temp    Pulse 73 05/18/2018  8:43 PM  Resp 17 05/18/2018  8:43 PM  SpO2 99 % 05/18/2018  8:43 PM  Vitals shown include unvalidated device data.  Last Pain:  Vitals:   05/18/18 1649  TempSrc:   PainSc: 3          Complications: No apparent anesthesia complications

## 2018-05-18 NOTE — ED Provider Notes (Signed)
[facility] DEPT Provider Note   CSN: [id number] Arrival date & time: 05/18/18  1621     History   Chief Complaint Chief Complaint  Patient presents with  . Urinary Retention  . Foreign Body    HPI MARKEL KURTENBACH is a 57 y.o. male who presents with fb in the urethra. PMH significant for prostate cancer, urethral strictures, HIV, anxiety, GERD. The patient self-caths 1-2 times a week because of his prostate cancer and stricture. He states that today around 7AM a piece of the catheter broke off in his bladder. He has been able to urinate but is having frequency and feels like he can't empty his bladder. He denies any penis or testicular pain. No bleeding from the penis. He has some low back pain but no flank pain.  HPI  Past Medical History:  Diagnosis Date  . Arthritis    r knee   . Asthma    very rare  . Axillary lymphadenopathy   . Bronchitis   . Chronic anemia    normocytic  . Chronic folliculitis   . Elevated PSA   . Environmental and seasonal allergies   . GERD (gastroesophageal reflux disease)   . Gross hematuria   . H/O pericarditis    2010--  myopercarditis--  resolved  . HCAP (healthcare-associated pneumonia) 11/19/2014  . Headache(784.0)    HX SEVERE FRONTAL HA'S  . Hiatal hernia   . History of concussion    2012  &  2013  RESIDUAL HA'S --  RESOLVED  . History of gastric ulcer   . History of kidney stones   . History of MRSA infection 2010   infected boil  . HIV (human immunodeficiency virus infection) ([place]) 1988  . Hypertension   . Internal hemorrhoids   . Lytic bone lesion of hip    WORK-UP DONE BY ONCOLOGIST DR HA --  NOT MALIGNANT  . Pneumonia    hx of  . Post concussion syndrome    resolved  . Prostate cancer ([place]) 04/25/13   gleason 3+3=6, volume 30 gm  . Sinusitis, chronic 05/14/2015  . Ulcer    hx of gastric  . Wears glasses     Patient Active Problem List   Diagnosis Date Noted  . Tension headache  11/12/2017  . Hepatitis B immune 06/06/2017  . Chronic pain of left wrist 06/06/2017  . Constipation 09/27/2016  . Primary osteoarthritis of right knee 08/01/2016  . Chronic renal insufficiency, stage 2 (mild) 05/22/2016  . Skin lesion of right lower extremity 02/22/2016  . Migraines 02/22/2016  . Low back pain 02/22/2016  . Osteoarthritis of right knee 01/11/2016  . Kidney stones 10/25/2015  . Sinusitis, chronic 05/14/2015  . Cough 01/18/2015  . Hereditary and idiopathic peripheral neuropathy 09/02/2014  . GERD (gastroesophageal reflux disease) 07/29/2014  . COPD GOLD III 07/22/2014  . Hx of primary hypertension 04/27/2014  . H/O prostate cancer   . Lytic bone lesion of hip 08/22/2012  . Cataract, nuclear 05/15/2012  . Asthma 01/23/2012  . Insomnia 05/11/2011  . HTN (hypertension) 04/17/2011  . Mood disorder in conditions classified elsewhere 06/14/2009  . Human immunodeficiency virus (HIV) disease ([place]) 04/19/2009    Past Surgical History:  Procedure Laterality Date  . CARDIAC CATHETERIZATION  03-23-2009  DR COOPER   NORMAL CORONARY ARTERIES  . CHOLECYSTECTOMY N/A 11/06/2014   Procedure: LAPAROSCOPIC CHOLECYSTECTOMY WITH INTRAOPERATIVE CHOLANGIOGRAM;  Surgeon: Judeth Horn, MD;  Location: [facility];  Service: General;  Laterality: N/A;  .  COLONOSCOPY  12/26/2011   Procedure: COLONOSCOPY;  Surgeon: Lear Ng, MD;  Location: [facility];  Service: Endoscopy;  Laterality: N/A;  . COLONOSCOPY WITH PROPOFOL N/A 07/29/2014   Procedure: COLONOSCOPY WITH PROPOFOL;  Surgeon: Lear Ng, MD;  Location: [facility];  Service: Endoscopy;  Laterality: N/A;  . CYSTOSCOPY/RETROGRADE/URETEROSCOPY Bilateral 04/25/2013   Procedure: CYSTOSCOPY/ BILATERAL RETROGRADES; BLADDER BIOPSIES;  Surgeon: Alexis Frock, MD;  Location: [facility];  Service: Urology;  Laterality: Bilateral;  . DENTAL EXAMINATION UNDER ANESTHESIA    . ESOPHAGOGASTRODUODENOSCOPY  12/26/2011    Procedure: ESOPHAGOGASTRODUODENOSCOPY (EGD);  Surgeon: Lear Ng, MD;  Location: [facility];  Service: Endoscopy;  Laterality: N/A;  . ESOPHAGOGASTRODUODENOSCOPY (EGD) WITH PROPOFOL N/A 07/29/2014   Procedure: ESOPHAGOGASTRODUODENOSCOPY (EGD) WITH PROPOFOL;  Surgeon: Lear Ng, MD;  Location: [facility];  Service: Endoscopy;  Laterality: N/A;  . EXCISION CHRONIC LEFT BREAST ABSCESS  09-21-2010  . EXCISIONAL BX LEFT BREAST MASS/  I  &  D LEFT BREAST ABSCESS  03-24-2009  . KNEE ARTHROSCOPY Right 1985  . left axilla biopsy  04/2013  . left hip biopsy  10/2012  . LYMPHADENECTOMY Bilateral 08/18/2013   Procedure: LYMPHADENECTOMY "PELVIC LYMPH NODE DISSECTION";  Surgeon: Alexis Frock, MD;  Location: [facility];  Service: Urology;  Laterality: Bilateral;  . PROSTATE BIOPSY N/A 04/25/2013   Procedure: BIOPSY TRANSRECTAL ULTRASONIC PROSTATE (TUBP);  Surgeon: Alexis Frock, MD;  Location: [facility];  Service: Urology;  Laterality: N/A;  . ROBOT ASSISTED LAPAROSCOPIC RADICAL PROSTATECTOMY N/A 08/18/2013   Procedure: ROBOTIC ASSISTED LAPAROSCOPIC RADICAL PROSTATECTOMY;  Surgeon: Alexis Frock, MD;  Location: [facility];  Service: Urology;  Laterality: N/A;  . TOTAL KNEE ARTHROPLASTY Right 08/01/2016   08/17/16  . TOTAL KNEE ARTHROPLASTY Right 08/01/2016   Procedure: TOTAL KNEE ARTHROPLASTY;  Surgeon: Renette Butters, MD;  Location: [facility];  Service: Orthopedics;  Laterality: Right;  . UPPER LEG SOFT TISSUE BIOPSY Left 2012   lthigh        Home Medications    Prior to Admission medications   Medication Sig Start Date End Date Taking? Authorizing Provider  ALPRAZolam (XANAX) 0.5 MG tablet TAKE 1 TABLET BY MOUTH AT BEDTIME AS NEEDED FOR ANXIETY Patient taking differently: Take 0.5 mg by mouth at bedtime as needed for anxiety.  11/12/17   Dennie Bible, NP  aspirin EC 81 MG tablet Take 81 mg by mouth every Monday, Wednesday, and Friday.     [provider]  chlorpheniramine-HYDROcodone (TUSSIONEX PENNKINETIC ER) 10-8 MG/5ML SUER Take 5 mLs by mouth at bedtime as needed for cough. 04/30/18   Golden Circle, FNP  darunavir (PREZISTA) 800 MG tablet TAKE 1 TABLET(800 MG) BY MOUTH DAILY Patient taking differently: Take 800 mg by mouth daily.  08/06/17   Campbell Riches, MD  diclofenac sodium (VOLTAREN) 1 % GEL APPLY 2 GRAMS TOPICALLY TO THE AFFECTED AREA TWICE DAILY AS NEEDED FOR PAIN Patient taking differently: Apply 2 g topically 2 (two) times daily as needed (pain).  06/06/17   Campbell Riches, MD  emtricitabine-tenofovir (TRUVADA) 200-300 MG tablet Take 1 tablet by mouth daily. 04/24/18   Solon Callas, NP  gabapentin (NEURONTIN) 300 MG capsule Take 1 capsule (300 mg total) by mouth 4 (four) times daily. Patient taking differently: Take 300 mg by mouth 4 (four) times daily as needed (pain).  11/12/17   Dennie Bible, NP  hydrOXYzine (ATARAX/VISTARIL) 25 MG tablet TAKE 1 TABLET(25 MG) BY MOUTH EVERY NIGHT  03/25/18   Dohmeier, Asencion Partridge, MD  ipratropium (ATROVENT) 0.02 % nebulizer solution USE 2.5 ML(0.5 MG) VIA NEBULIZER THREE TIMES DAILY AS NEEDED FOR WHEEZING OR SHORTNESS OF BREATH Patient taking differently: Take 0.5 mg by nebulization 3 (three) times daily as needed for wheezing or shortness of breath.  11/02/16   Jule Ser, DO  ISENTRESS 400 MG tablet TAKE 2 TABLETS(800 MG) BY MOUTH AT BEDTIME 04/24/18   Dixon, Melton Krebs, NP  l-methylfolate-B6-B12 (METANX) 3-35-2 MG TABS tablet Take 1 tablet by mouth daily. 06/06/17   Campbell Riches, MD  metoprolol succinate (TOPROL-XL) 25 MG 24 hr tablet Take by mouth.    [provider]  mometasone-formoterol (DULERA) 200-5 MCG/ACT AERO Take 2 puffs first thing in am and then another 2 puffs about 12 hours later. Patient taking differently: Inhale 1 puff into the lungs every 12 (twelve) hours.  07/22/14   Tanda Rockers, MD  montelukast (SINGULAIR) 10 MG tablet TAKE 1 TABLET(10  MG) BY MOUTH AT BEDTIME Patient taking differently: Take 10 mg by mouth at bedtime.  05/09/17   Campbell Riches, MD  Multiple Vitamin (MULTIVITAMIN WITH MINERALS) TABS tablet Take 1 tablet by mouth daily after breakfast.     [provider]  mupirocin ointment (BACTROBAN) 2 % APPLY EXTERNALLY TO THE AFFECTED AREA TWICE DAILY FOR 3 WEEKS, PLACE COTTON BETWEEN TOES TO ALLOW BETTER AERATION AS NEEDED 04/26/18   Campbell Riches, MD  NORVIR 100 MG TABS tablet Take 1 tablet (100 mg total) by mouth at bedtime. 04/24/18   [place] Callas, NP  omeprazole (PRILOSEC) 40 MG capsule TAKE 1 CAPSULE(40 MG) BY MOUTH EVERY MORNING 02/25/18   Pyrtle, Lajuan Lines, MD  ondansetron (ZOFRAN ODT) 8 MG disintegrating tablet Take 1 tablet (8 mg total) by mouth every 8 (eight) hours as needed for nausea or vomiting. 06/06/17   Campbell Riches, MD  Polyethyl Glycol-Propyl Glycol (SYSTANE) 0.4-0.3 % GEL ophthalmic gel Place 1 application into both eyes 2 (two) times daily.     [provider]  SUMAtriptan (IMITREX) 100 MG tablet Take 1 tablet (100 mg total) by mouth every 2 (two) hours as needed for migraine. May repeat in 2 hours if headache persists or recurs. 04/24/18   Dixon, Melton Krebs, NP  tiZANidine (ZANAFLEX) 2 MG tablet TAKE 3 TABLETS BY MOUTH THREE TIMES DAILY AS NEEDED Patient taking differently: Take 6 mg by mouth every 8 (eight) hours as needed for muscle spasms.  11/12/17   Dennie Bible, NP  torsemide (DEMADEX) 20 MG tablet Take 20 mg by mouth daily.    [provider]  VENTOLIN HFA 108 (90 Base) MCG/ACT inhaler INHALE 2 PUFFS INTO THE LUNGS EVERY 6 HOURS AS NEEDED FOR WHEEZING OR SHORTNESS OF BREATH 04/09/18   Campbell Riches, MD  zolpidem (AMBIEN) 5 MG tablet Take 1-2 tablets (5-10 mg total) by mouth at bedtime as needed for sleep. 12/21/16   Jule Ser, DO    Family History Family History  Problem Relation Age of Onset  . Stroke Father   . Diabetes Father   .  Cancer Father        brain cancer  . Asthma Father   . Hypertension Sister   . Cancer Maternal Uncle        prostate cancer  . Asthma Sister     Social History Social History   Tobacco Use  . Smoking status: Former Smoker    Packs/day: 1.50    Years:  23.00    Pack years: 34.50    Types: Cigars, Cigarettes    Last attempt to quit: 05/22/2010    Years since quitting: 7.9  . Smokeless tobacco: Never Used  Substance Use Topics  . Alcohol use: No    Alcohol/week: 0.0 standard drinks  . Drug use: No     Allergies   Chocolate; Descovy [emtricitabine-tenofovir af]; Genvoya [elviteg-cobic-emtricit-tenofaf]; Morphine and related; Penicillins; Sulfa antibiotics; Tomato; Vancomycin; Flagyl [metronidazole]; Toradol [ketorolac tromethamine]; and Tramadol   Review of Systems Review of Systems  Genitourinary: Positive for difficulty urinating. Negative for dysuria, flank pain, hematuria, penile pain, penile swelling and scrotal swelling.  Musculoskeletal: Positive for back pain.     Physical Exam Updated Vital Signs BP (!) 179/113 (BP Location: Right Arm)   Pulse 85   Temp 98.4 F (36.9 C) (Oral)   Resp 14   Ht 5\' 9"  (1.753 m)   Wt 97.5 kg   SpO2 91%   BMI 31.75 kg/m   Physical Exam  Constitutional: He is oriented to person, place, and time. He appears well-developed and well-nourished. No distress.  HENT:  Head: Normocephalic and atraumatic.  Eyes: Pupils are equal, round, and reactive to light. Conjunctivae are normal. Right eye exhibits no discharge. Left eye exhibits no discharge. No scleral icterus.  Neck: Normal range of motion.  Cardiovascular: Normal rate.  Pulmonary/Chest: Effort normal. No respiratory distress.  Abdominal: Soft. Bowel sounds are normal. He exhibits no distension. There is no tenderness.  Genitourinary:  Genitourinary Comments: No inguinal lymphadenopathy or inguinal hernia noted. Normal circumcised penis free of lesions or rash. Testicles are  nontender with normal lie. Normal scrotal appearance. No obvious discharge noted. No penile tenderness. Possible fb palpated at the perineum. Chaperone present during exam.    Neurological: He is alert and oriented to person, place, and time.  Skin: Skin is warm and dry.  Psychiatric: He has a normal mood and affect. His behavior is normal.  Nursing note and vitals reviewed.    ED Treatments / Results  Labs (all labs ordered are listed, but only abnormal results are displayed) Labs Reviewed  I-STAT CHEM 8, ED    EKG None  Radiology No results found.  Procedures Procedures (including critical care time)  Medications Ordered in ED Medications - No data to display   Initial Impression / Assessment and Plan / ED Course  I have reviewed the triage vital signs and the nursing notes.  Pertinent labs & imaging results that were available during my care of the patient were reviewed by me and considered in my medical decision making (see chart for details).  57 year old male presents with FB in the urethra. Initially he tells me it is from self-cathing. I consulted with Dr. Karsten Ro who came to see the patient and the patient did admit to him that this occurred due to sex purposes.   Dr. Karsten Ro attempted removal of object at bedside. He informed me it is not a catheter but a sex toy. He will take him to the OR.  Final Clinical Impressions(s) / ED Diagnoses   Final diagnoses:  Foreign body in urethra, initial encounter    ED Discharge Orders    None       Recardo Evangelist, PA-C 05/18/18 1941    Fredia Sorrow, MD 05/21/18 [phone]

## 2018-05-18 NOTE — Consult Note (Signed)
Urology Consult  CC: Referring physician: Gloris Manchester, MD Reason for referral: Urethral foreign body  Procedure: I obtained informed consent verbally by discussing the planned procedure of cystoscopy and foreign body removal in the emergency room with him.  He consented to the procedure and I therefore sterilely prepped the penis with Betadine and draped with sterile towels.  I placed the flexible alligator graspers in the flexible cystoscope and passed this down the urethra which was noted to be normal down to about the bulbar urethra where it appeared very "ratty" I then identified a red foreign body in the deep bulbar urethra.  I first tried to grasp it with the alligator graspers but they were not stout enough to pull the device from the urethra.  I then found that I was able to actually pass the flexible cystoscope down through the lumen of the foreign body and tried to flex the scope in order to create friction and extract the foreign body but this also was unsuccessful.  Finally I used a 0 tip nitinol basket to try to engage the device several different ways but was on successful.  I therefore terminated the procedure and elected to proceed with an attempted extraction under anesthesia.  Impression/Assessment: Urethral foreign body: I have discussed with the patient the fact that this seems to be quite firmly lodged in the urethra.  I therefore am going to use general anesthesia and this will allow larger, stronger instruments to hopefully be able to grasp the foreign body and remove it from the urethra.  I went over the procedure with him in detail including how this would be performed, the risks and complications and also the fact that if I am unsuccessful at removing this cystoscopically I will need to perform a perineal ureterotomy and remove it via an open procedure with further risks including fistula formation associated with this.  He understands and is elected to proceed.  Plan:  Anesthetic cystoscopy and removal of urethral foreign body with possible perineal ureterostomy for removal.  History of Present Illness: The patient is a 57 year old male who has a history of urethral stricture disease.  He sees Dr. Tresa Moore and is being managed with self dilatation with intermittent self-catheterization on a weekly to biweekly basis.  He presented to the emergency room and reported performing self-catheterization and having the catheter break off in the urethra.  He reported that he has since been experiencing a sense of urgency and a feeling of needing to urinate but only able to void a small amount of time.  He denies a history of recurrent urinary tract infections or hematuria.  According to Dr. 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  When I was able to discuss his current situation further with him with no one else in the room he confided that in fact this was a sex toy of some form that was placed in his urethra and then broke off.   Past Medical History:  Diagnosis Date  . Arthritis    r knee   . Asthma    very rare  . Axillary lymphadenopathy   . Bronchitis   . Chronic anemia    normocytic  . Chronic folliculitis   . Elevated PSA   . Environmental and seasonal allergies   . GERD (gastroesophageal reflux disease)   . [place]  hematuria   . H/O pericarditis    2010--  myopercarditis--  resolved  . HCAP (healthcare-associated pneumonia) 11/19/2014  . Headache(784.0)    HX SEVERE FRONTAL HA'S  . Hiatal hernia   . History of concussion    2012  &  2013  RESIDUAL HA'S --  RESOLVED  . History of gastric ulcer   . History of kidney stones   . History of MRSA infection 2010   infected boil  . HIV (human immunodeficiency virus infection) ([place]) 1988  . Hypertension   . Internal hemorrhoids   . Lytic bone  lesion of hip    WORK-UP DONE BY ONCOLOGIST DR HA --  NOT MALIGNANT  . Pneumonia    hx of  . Post concussion syndrome    resolved  . Prostate cancer ([place]) 04/25/13   gleason 3+3=6, volume 30 gm  . Sinusitis, chronic 05/14/2015  . Ulcer    hx of gastric  . Wears glasses    Past Surgical History:  Procedure Laterality Date  . CARDIAC CATHETERIZATION  03-23-2009  DR COOPER   NORMAL CORONARY ARTERIES  . CHOLECYSTECTOMY N/A 11/06/2014   Procedure: LAPAROSCOPIC CHOLECYSTECTOMY WITH INTRAOPERATIVE CHOLANGIOGRAM;  Surgeon: Judeth Horn, MD;  Location: [facility];  Service: General;  Laterality: N/A;  . COLONOSCOPY  12/26/2011   Procedure: COLONOSCOPY;  Surgeon: Lear Ng, MD;  Location: [facility];  Service: Endoscopy;  Laterality: N/A;  . COLONOSCOPY WITH PROPOFOL N/A 07/29/2014   Procedure: COLONOSCOPY WITH PROPOFOL;  Surgeon: Lear Ng, MD;  Location: [facility];  Service: Endoscopy;  Laterality: N/A;  . CYSTOSCOPY/RETROGRADE/URETEROSCOPY Bilateral 04/25/2013   Procedure: CYSTOSCOPY/ BILATERAL RETROGRADES; BLADDER BIOPSIES;  Surgeon: Alexis Frock, MD;  Location[facility];  Service: Urology;  Laterality: Bilateral;  . DENTAL EXAMINATION UNDER ANESTHESIA    . ESOPHAGOGASTRODUODENOSCOPY  12/26/2011   Procedure: ESOPHAGOGASTRODUODENOSCOPY (EGD);  Surgeon: Lear Ng, MD;  Location: [facility];  Service: Endoscopy;  Laterality: N/A;  . ESOPHAGOGASTRODUODENOSCOPY (EGD) WITH PROPOFOL N/A 07/29/2014   Procedure: ESOPHAGOGASTRODUODENOSCOPY (EGD) WITH PROPOFOL;  Surgeon: Lear Ng, MD;  Location: [facility];  Service: Endoscopy;  Laterality: N/A;  . EXCISION CHRONIC LEFT BREAST ABSCESS  09-21-2010  . EXCISIONAL BX LEFT BREAST MASS/  I  &  D LEFT BREAST ABSCESS  03-24-2009  . KNEE ARTHROSCOPY Right 1985  . left axilla biopsy  04/2013  . left hip biopsy  10/2012  . LYMPHADENECTOMY Bilateral 08/18/2013   Procedure: LYMPHADENECTOMY "PELVIC LYMPH  NODE DISSECTION";  Surgeon: Alexis Frock, MD;  Location: [facility];  Service: Urology;  Laterality: Bilateral;  . PROSTATE BIOPSY N/A 04/25/2013   Procedure: BIOPSY TRANSRECTAL ULTRASONIC PROSTATE (TUBP);  Surgeon: Alexis Frock, MD;  Location: [facility];  Service: Urology;  Laterality: N/A;  . ROBOT ASSISTED LAPAROSCOPIC RADICAL PROSTATECTOMY N/A 08/18/2013   Procedure: ROBOTIC ASSISTED LAPAROSCOPIC RADICAL PROSTATECTOMY;  Surgeon: Alexis Frock, MD;  [facility];  Service: Urology;  Laterality: N/A;  . TOTAL KNEE ARTHROPLASTY Right 08/01/2016   08/17/16  . TOTAL KNEE ARTHROPLASTY Right 08/01/2016   Procedure: TOTAL KNEE ARTHROPLASTY;  Surgeon: Renette Butters, MD;  Location: [facility];  Service: Orthopedics;  Laterality: Right;  . UPPER LEG SOFT TISSUE BIOPSY Left 2012   lthigh    Medications: Prior to Admission:  (Not in a hospital admission)  Allergies:  Allergies  Allergen Reactions  . Chocolate Shortness Of Breath and Other (See Comments)    Asthma attack, welts  . Descovy [Emtricitabine-Tenofovir Af] Shortness Of  Breath    Nasuea, vomiiting, diarrhea, sob, whelps, throat closes  . Genvoya [Elviteg-Cobic-Emtricit-Tenofaf] Shortness Of Breath    Nausea, vomitting, diarreha, sob, rash, throat closing  . Morphine And Related Shortness Of Breath, Itching and Other (See Comments)     chest pain  . Penicillins Shortness Of Breath and Rash    Has patient had a PCN reaction causing immediate rash, facial/tongue/throat swelling, SOB or lightheadedness with hypotension: Yes Has patient had a PCN reaction causing severe rash involving mucus membranes or skin necrosis: No Has patient had a PCN reaction that required hospitalization No Has patient had a PCN reaction occurring within the last 10 years: Yes If all of the above answers are "NO", then may proceed with Cephalosporin use.  . Sulfa Antibiotics Anaphylaxis  . Tomato Shortness Of Breath  . Vancomycin Shortness Of  Breath  . Flagyl [Metronidazole] Itching  . Toradol [Ketorolac Tromethamine] Nausea And Vomiting    itching  . Tramadol Itching and Nausea And Vomiting    Family History  Problem Relation Age of Onset  . Stroke Father   . Diabetes Father   . Cancer Father        brain cancer  . Asthma Father   . Hypertension Sister   . Cancer Maternal Uncle        prostate cancer  . Asthma Sister     Social History:  reports that he quit smoking about 7 years ago. His smoking use included cigars and cigarettes. He has a 34.50 pack-year smoking history. He has never used smokeless tobacco. He reports that he does not drink alcohol or use drugs.  Review of Systems (10 point): Pertinent items are noted in HPI. A comprehensive review of systems was negative except as noted above.  Physical Exam:  Vital signs in last 24 hours: Temp:  [98.4 F (36.9 C)] 98.4 F (36.9 C) (11/16 1640) Pulse Rate:  [85] 85 (11/16 1640) Resp:  [14] 14 (11/16 1640) BP: (179)/(113) 179/113 (11/16 1640) SpO2:  [91 %] 91 % (11/16 1640) Weight:  [97.5 kg] 97.5 kg (11/16 1640) General appearance: alert and appears stated age Head: Normocephalic, without obvious abnormality, atraumatic Eyes: conjunctivae/corneas clear. EOM's intact.  Oropharynx: moist mucous membranes Neck: supple, symmetrical, trachea midline Resp: normal respiratory effort Cardio: regular rate and rhythm Back: symmetric, no curvature. ROM normal. No CVA tenderness. GI: soft, non-tender; bowel sounds normal; no masses,  no organomegaly Male genitalia: penis: normal male phallus with no lesions or discharge.Testes: bilaterally descended with no masses or tenderness. no hernias Palpation in the area of the perineum does reveal what feels like a firm area in the urethra which is likely the foreign body. Extremities: extremities normal, atraumatic, no cyanosis or edema Skin: Skin color normal. No visible rashes or lesions Neurologic: Grossly  normal  Laboratory Data:  No results for input(s): WBC, HGB, HCT in the last 72 hours. BMET No results for input(s): NA, K, CL, CO2, GLUCOSE, BUN, CREATININE, CALCIUM in the last 72 hours. No results for input(s): LABPT, INR in the last 72 hours. No results for input(s): LABURIN in the last 72 hours. Results for orders placed or performed during the hospital encounter of 04/26/17  MRSA PCR Screening     Status: None   Collection Time: 04/27/17  1:00 AM  Result Value Ref Range Status   MRSA by PCR NEGATIVE NEGATIVE Final    Comment:        The GeneXpert MRSA Assay (FDA approved for NASAL specimens  only), is one component of a comprehensive MRSA colonization surveillance program. It is not intended to diagnose MRSA infection nor to guide or monitor treatment for MRSA infections.    Creatinine: No results for input(s): CREATININE in the last 168 hours.  Imaging: No results found.     Betty Brooks C 05/18/2018, 7:09 PM

## 2018-05-19 ENCOUNTER — Encounter (HOSPITAL_COMMUNITY): Payer: Self-pay | Admitting: Urology

## 2018-05-29 DIAGNOSIS — L600 Ingrowing nail: Secondary | ICD-10-CM | POA: Diagnosis not present

## 2018-06-03 NOTE — Telephone Encounter (Signed)
Patient is asking for update on referral to [facility].  Please advise. Landis Gandy, RN

## 2018-06-04 ENCOUNTER — Other Ambulatory Visit: Payer: Self-pay | Admitting: Nurse Practitioner

## 2018-06-04 DIAGNOSIS — G63 Polyneuropathy in diseases classified elsewhere: Principal | ICD-10-CM

## 2018-06-04 DIAGNOSIS — B20 Human immunodeficiency virus [HIV] disease: Secondary | ICD-10-CM

## 2018-06-04 DIAGNOSIS — R252 Cramp and spasm: Secondary | ICD-10-CM

## 2018-06-04 NOTE — Telephone Encounter (Signed)
[facility] Database Verified LR: 10.26.19 Qty: 30 Pending appt: 5.14.2020

## 2018-06-05 ENCOUNTER — Telehealth: Payer: Self-pay

## 2018-06-05 NOTE — Telephone Encounter (Signed)
Fax confirmation received [facility] [phone].

## 2018-06-05 NOTE — Telephone Encounter (Signed)
Pharmacy requesting renewal for Mupirocin 2% ointment 22 gm. Will route message to DR. Hatcher if refill is appropriate. [place]

## 2018-06-05 NOTE — Telephone Encounter (Signed)
Per Dr [name].  If patient  Is having persistent cough he will need evaluation from his primary care physician.   Refill denied for refill of tussionex.     Laverle Patter, RN

## 2018-06-06 ENCOUNTER — Other Ambulatory Visit: Payer: Self-pay

## 2018-06-06 DIAGNOSIS — B20 Human immunodeficiency virus [HIV] disease: Secondary | ICD-10-CM

## 2018-06-06 MED ORDER — MUPIROCIN 2 % EX OINT: TOPICAL_OINTMENT | CUTANEOUS | 0 refills | Status: DC

## 2018-06-06 NOTE — Telephone Encounter (Signed)
Ok to refill   thanks

## 2018-06-27 ENCOUNTER — Encounter: Payer: Self-pay | Admitting: Nurse Practitioner

## 2018-06-27 DIAGNOSIS — M5022 Other cervical disc displacement, mid-cervical region, unspecified level: Secondary | ICD-10-CM | POA: Diagnosis not present

## 2018-06-27 DIAGNOSIS — M5412 Radiculopathy, cervical region: Secondary | ICD-10-CM | POA: Diagnosis not present

## 2018-06-27 DIAGNOSIS — M542 Cervicalgia: Secondary | ICD-10-CM | POA: Diagnosis not present

## 2018-06-29 ENCOUNTER — Other Ambulatory Visit: Payer: Self-pay | Admitting: Nurse Practitioner

## 2018-06-29 ENCOUNTER — Other Ambulatory Visit: Payer: Self-pay | Admitting: Internal Medicine

## 2018-06-29 ENCOUNTER — Other Ambulatory Visit: Payer: Self-pay | Admitting: Infectious Diseases

## 2018-06-29 DIAGNOSIS — J302 Other seasonal allergic rhinitis: Secondary | ICD-10-CM

## 2018-06-29 DIAGNOSIS — R252 Cramp and spasm: Secondary | ICD-10-CM

## 2018-06-29 DIAGNOSIS — G63 Polyneuropathy in diseases classified elsewhere: Principal | ICD-10-CM

## 2018-06-29 DIAGNOSIS — B20 Human immunodeficiency virus [HIV] disease: Secondary | ICD-10-CM

## 2018-06-29 DIAGNOSIS — J449 Chronic obstructive pulmonary disease, unspecified: Secondary | ICD-10-CM

## 2018-07-01 ENCOUNTER — Other Ambulatory Visit: Payer: Self-pay | Admitting: *Deleted

## 2018-07-01 ENCOUNTER — Other Ambulatory Visit: Payer: Self-pay

## 2018-07-01 DIAGNOSIS — B20 Human immunodeficiency virus [HIV] disease: Secondary | ICD-10-CM

## 2018-07-01 MED ORDER — DARUNAVIR ETHANOLATE 800 MG PO TABS: ORAL_TABLET | ORAL | 0 refills | Status: DC

## 2018-07-01 MED ORDER — ISENTRESS 400 MG PO TABS: ORAL_TABLET | ORAL | 0 refills | Status: DC

## 2018-07-01 MED ORDER — ISENTRESS 400 MG PO TABS: ORAL_TABLET | ORAL | 1 refills | Status: DC

## 2018-07-01 MED ORDER — DARUNAVIR ETHANOLATE 800 MG PO TABS: ORAL_TABLET | ORAL | 1 refills | Status: DC

## 2018-07-01 NOTE — Telephone Encounter (Signed)
Drug Registry checked last fill 06-04-18 #30.  Last seen 10/2017.

## 2018-07-01 NOTE — Telephone Encounter (Signed)
Fax confirmation received alprazolam [facility] [phone].

## 2018-07-04 ENCOUNTER — Telehealth: Payer: Self-pay | Admitting: *Deleted

## 2018-07-04 DIAGNOSIS — G43009 Migraine without aura, not intractable, without status migrainosus: Secondary | ICD-10-CM

## 2018-07-04 MED ORDER — L-METHYLFOLATE-B6-B12 3-35-2 MG PO TABS: 1.0000 | ORAL_TABLET | Freq: Every day | ORAL | 0 refills | Status: DC

## 2018-07-04 NOTE — Telephone Encounter (Signed)
Refill request received for metanx.  This was filled as a courtesy by Dr [name], RN refilled and asked for future refills to come from primary care. Landis Gandy, RN

## 2018-07-17 ENCOUNTER — Other Ambulatory Visit: Payer: Self-pay

## 2018-07-17 DIAGNOSIS — G43009 Migraine without aura, not intractable, without status migrainosus: Secondary | ICD-10-CM

## 2018-07-17 MED ORDER — SUMATRIPTAN SUCCINATE 100 MG PO TABS: 100.0000 mg | ORAL_TABLET | ORAL | 2 refills | Status: DC | PRN

## 2018-08-13 ENCOUNTER — Other Ambulatory Visit: Payer: Self-pay | Admitting: Infectious Diseases

## 2018-08-13 ENCOUNTER — Other Ambulatory Visit: Payer: Self-pay

## 2018-08-13 DIAGNOSIS — J449 Chronic obstructive pulmonary disease, unspecified: Secondary | ICD-10-CM

## 2018-08-13 DIAGNOSIS — B20 Human immunodeficiency virus [HIV] disease: Secondary | ICD-10-CM

## 2018-08-13 DIAGNOSIS — G609 Hereditary and idiopathic neuropathy, unspecified: Secondary | ICD-10-CM

## 2018-08-13 MED ORDER — NORVIR 100 MG PO TABS: 100.0000 mg | ORAL_TABLET | Freq: Every day | ORAL | 1 refills | Status: DC

## 2018-08-13 MED ORDER — EMTRICITABINE-TENOFOVIR DF 200-300 MG PO TABS: 1.0000 | ORAL_TABLET | Freq: Every day | ORAL | 3 refills | Status: DC

## 2018-08-13 MED ORDER — ALBUTEROL SULFATE HFA 108 (90 BASE) MCG/ACT IN AERS: INHALATION_SPRAY | RESPIRATORY_TRACT | 3 refills | Status: DC

## 2018-08-13 MED ORDER — ISENTRESS 400 MG PO TABS: ORAL_TABLET | ORAL | 1 refills | Status: DC

## 2018-08-30 ENCOUNTER — Other Ambulatory Visit: Payer: Self-pay | Admitting: Infectious Diseases

## 2018-08-30 DIAGNOSIS — G8929 Other chronic pain: Secondary | ICD-10-CM

## 2018-08-30 DIAGNOSIS — M25532 Pain in left wrist: Principal | ICD-10-CM

## 2018-09-02 ENCOUNTER — Ambulatory Visit
Admission: RE | Admit: 2018-09-02 | Discharge: 2018-09-02 | Disposition: A | Discharge status: Home / Self Care | Payer: Medicare Other | Source: Ambulatory Visit | Attending: Cardiovascular Disease | Admitting: Cardiovascular Disease

## 2018-09-02 ENCOUNTER — Other Ambulatory Visit: Payer: Self-pay | Admitting: Cardiovascular Disease

## 2018-09-02 DIAGNOSIS — R05 Cough: Secondary | ICD-10-CM

## 2018-09-02 DIAGNOSIS — R053 Chronic cough: Secondary | ICD-10-CM

## 2018-09-02 DIAGNOSIS — Z87891 Personal history of nicotine dependence: Secondary | ICD-10-CM

## 2018-09-03 ENCOUNTER — Encounter: Payer: Self-pay | Admitting: Primary Care

## 2018-09-03 ENCOUNTER — Ambulatory Visit (INDEPENDENT_AMBULATORY_CARE_PROVIDER_SITE_OTHER): Payer: Medicare Other | Admitting: Primary Care

## 2018-09-03 ENCOUNTER — Ambulatory Visit: Payer: Medicare Other | Admitting: Primary Care

## 2018-09-03 ENCOUNTER — Encounter: Payer: Self-pay | Admitting: *Deleted

## 2018-09-03 VITALS — BP 142/80 | HR 67 | Temp 98.7°F | Ht 69.0 in | Wt 228.0 lb

## 2018-09-03 DIAGNOSIS — J449 Chronic obstructive pulmonary disease, unspecified: Secondary | ICD-10-CM | POA: Diagnosis not present

## 2018-09-03 DIAGNOSIS — R69 Illness, unspecified: Secondary | ICD-10-CM | POA: Diagnosis not present

## 2018-09-03 DIAGNOSIS — J111 Influenza due to unidentified influenza virus with other respiratory manifestations: Secondary | ICD-10-CM

## 2018-09-03 LAB — POCT INFLUENZA A/B
Influenza A, POC: NEGATIVE
Influenza B, POC: NEGATIVE

## 2018-09-03 MED ORDER — HYDROCOD POLST-CPM POLST ER 10-8 MG/5ML PO SUER: 5.0000 mL | Freq: Two times a day (BID) | ORAL | 0 refills | Status: DC

## 2018-09-03 MED ORDER — DOXYCYCLINE HYCLATE 100 MG PO TABS: 100.0000 mg | ORAL_TABLET | Freq: Two times a day (BID) | ORAL | 0 refills | Status: DC

## 2018-09-03 NOTE — Assessment & Plan Note (Addendum)
-   Symptoms consistent with acute COPD exacerbation, no significant wheezing on exam  - Doxycyline 1 tab twice daily x1 week - Tussionex 28ml twice daily as needed for cough, safe precautions and PMP reviewed - Mucinex and delsym cough syrup twice daily  - Continue Dulera 200 2bid; Ventolin hfa every 6 hours prn wheezing/shortness of breath  - Needs repeat CXR with nipple markers  - Requesting labs from Dr. Doylene Canard office

## 2018-09-03 NOTE — Progress Notes (Signed)
@Patient  ID: Casey Brennan, male    DOB: 02/17/1961, 58 y.o.   MRN: [id number]  Chief Complaint  Patient presents with  . Acute Visit    thick green mucus, cough, chest soreness    Referring provider: Campbell Riches, MD  HPI: 58 year old male, former smoker. PMH significant for COPD GOLD III, asthma, sinusitis. Patient of Dr. Melvyn Novas, last seen on 06/28/17.   09/03/2018 Patient presents today with acute complaints of productive cough x 2 weeks. Accompanied by his sister. Reports fever 5-6 days ago. He was given tamiflu without swabbing for influenza. 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 No known exposure. He has taken Tussionex in the past, tolerated with improvement in cough. Denies shortness of breath or wheezing. Afebrile today.     Allergies  Allergen Reactions  . Chocolate Shortness Of Breath and Other (See Comments)    Asthma attack, welts  . Descovy [Emtricitabine-Tenofovir Af] Shortness Of Breath    Nasuea, vomiiting, diarrhea, sob, whelps, throat closes  . Genvoya [Elviteg-Cobic-Emtricit-Tenofaf] Shortness Of Breath    Nausea, vomitting, diarreha, sob, rash, throat closing  . Morphine And Related Shortness Of Breath, Itching and Other (See Comments)     chest pain  . Penicillins Shortness Of Breath and Rash    Has patient had a PCN reaction causing immediate rash, facial/tongue/throat swelling, SOB or lightheadedness with hypotension: Yes Has patient had a PCN reaction causing severe rash involving mucus membranes or skin necrosis: No Has patient had a PCN reaction that required hospitalization No Has patient had a PCN reaction occurring within the last 10 years: Yes If all of the above answers are "NO", then may proceed with Cephalosporin use.  . Sulfa Antibiotics Anaphylaxis  . Tomato Shortness Of Breath  . Vancomycin Shortness Of Breath  . Flagyl [Metronidazole] Itching  . Toradol [Ketorolac Tromethamine] Nausea And Vomiting    itching  .  Tramadol Itching and Nausea And Vomiting    Immunization History  Administered Date(s) Administered  . Hepatitis A 06/12/2011  . Influenza Split 06/12/2012  . Influenza Whole 04/19/2009, 07/25/2010, 02/22/2011  . Influenza,inj,Quad PF,6+ Mos 06/04/2013, 04/27/2014, 07/21/2015, 05/22/2016, 04/27/2017  . Influenza-Unspecified 01/30/2015  . Meningococcal Mcv4o 05/22/2016  . PPD Test 05/05/2009  . Pneumococcal Polysaccharide-23 03/22/2009, 08/19/2013  . Tdap 06/12/2011    Past Medical History:  Diagnosis Date  . Arthritis    r knee   . Asthma    very rare  . Axillary lymphadenopathy   . Bronchitis   . Chronic anemia    normocytic  . Chronic folliculitis   . Elevated PSA   . Environmental and seasonal allergies   . GERD (gastroesophageal reflux disease)   . Gross hematuria   . H/O pericarditis    2010--  myopercarditis--  resolved  . HCAP (healthcare-associated pneumonia) 11/19/2014  . Headache(784.0)    HX SEVERE FRONTAL HA'S  . Hiatal hernia   . History of concussion    2012  &  2013  RESIDUAL HA'S --  RESOLVED  . History of gastric ulcer   . History of kidney stones   . History of MRSA infection 2010   infected boil  . HIV (human immunodeficiency virus infection) ([place]) 1988  . Hypertension   . Internal hemorrhoids   . Lytic bone lesion of hip    WORK-UP DONE BY ONCOLOGIST DR HA --  NOT MALIGNANT  . Pneumonia    hx of  . Post concussion  syndrome    resolved  . Prostate cancer ([place]) 04/25/13   gleason 3+3=6, volume 30 gm  . Sinusitis, chronic 05/14/2015  . Ulcer    hx of gastric  . Wears glasses     Tobacco History: Social History   Tobacco Use  Smoking Status Former Smoker  . Packs/day: 1.50  . Years: 23.00  . Pack years: 34.50  . Types: Cigars, Cigarettes  . Last attempt to quit: 05/22/2010  . Years since quitting: 8.2  Smokeless Tobacco Never Used   Counseling given: Not Answered   Outpatient Medications Prior to Visit  Medication Sig  Dispense Refill  . albuterol (VENTOLIN HFA) 108 (90 Base) MCG/ACT inhaler INHALE 2 PUFFS INTO THE LUNGS EVERY 6 HOURS AS NEEDED FOR WHEEZING OR SHORTNESS OF BREATH 36 g 3  . ALPRAZolam (XANAX) 0.5 MG tablet TAKE 1 TABLET BY MOUTH AT BEDTIME AS NEEDED FOR ANXIETY 30 tablet 5  . aspirin EC 81 MG tablet Take 81 mg by mouth every Monday, Wednesday, and Friday.     . darunavir (PREZISTA) 800 MG tablet TAKE 1 TABLET(800 MG) BY MOUTH DAILY 90 tablet 1  . diclofenac sodium (VOLTAREN) 1 % GEL APPLY 2 GRAMS EXTERNALLY TO THE AFFECTED AREA TWICE DAILY AS NEEDED FOR PAIN 100 g 2  . emtricitabine-tenofovir (TRUVADA) 200-300 MG tablet Take 1 tablet by mouth daily. 90 tablet 3  . gabapentin (NEURONTIN) 300 MG capsule Take 1 capsule (300 mg total) by mouth 4 (four) times daily. (Patient taking differently: Take 300 mg by mouth 4 (four) times daily as needed (pain). ) 120 capsule 11  . hydrOXYzine (ATARAX/VISTARIL) 25 MG tablet TAKE 1 TABLET(25 MG) BY MOUTH EVERY NIGHT 90 tablet 2  . ipratropium (ATROVENT) 0.02 % nebulizer solution USE 2.5 ML(0.5 MG) VIA NEBULIZER THREE TIMES DAILY AS NEEDED FOR WHEEZING OR SHORTNESS OF BREATH (Patient taking differently: Take 0.5 mg by nebulization 3 (three) times daily as needed for wheezing or shortness of breath. ) 1650 mL 1  . ISENTRESS 400 MG tablet TAKE 2 TABLETS(800 MG) BY MOUTH AT BEDTIME 180 tablet 1  . l-methylfolate-B6-B12 (METANX) 3-35-2 MG TABS tablet Take 1 tablet by mouth daily. 90 tablet 0  . metoprolol succinate (TOPROL-XL) 25 MG 24 hr tablet Take by mouth.    . mometasone-formoterol (DULERA) 200-5 MCG/ACT AERO Take 2 puffs first thing in am and then another 2 puffs about 12 hours later. (Patient taking differently: Inhale 1 puff into the lungs every 12 (twelve) hours. ) 1 Inhaler 11  . montelukast (SINGULAIR) 10 MG tablet TAKE 1 TABLET(10 MG) BY MOUTH AT BEDTIME 30 tablet 3  . Multiple Vitamin (MULTIVITAMIN WITH MINERALS) TABS tablet Take 1 tablet by mouth daily  after breakfast.     . mupirocin ointment (BACTROBAN) 2 % APPLY EXTERNALLY TO THE AFFECTED AREA TWICE DAILY FOR 3 WEEKS, PLACE COTTON BETWEEN TOES TO ALLOW BETTER AERATION AS NEEDED 66 g 0  . NORVIR 100 MG TABS tablet Take 1 tablet (100 mg total) by mouth at bedtime. 90 tablet 1  . omeprazole (PRILOSEC) 40 MG capsule TAKE 1 CAPSULE(40 MG) BY MOUTH EVERY MORNING 30 capsule 1  . ondansetron (ZOFRAN ODT) 8 MG disintegrating tablet Take 1 tablet (8 mg total) by mouth every 8 (eight) hours as needed for nausea or vomiting. 60 tablet 3  . Polyethyl Glycol-Propyl Glycol (SYSTANE) 0.4-0.3 % GEL ophthalmic gel Place 1 application into both eyes 2 (two) times daily.     . SUMAtriptan (IMITREX) 100 MG tablet  Take 1 tablet (100 mg total) by mouth every 2 (two) hours as needed for migraine. May repeat in 2 hours if headache persists or recurs. 10 tablet 2  . tiZANidine (ZANAFLEX) 2 MG tablet TAKE 3 TABLETS BY MOUTH THREE TIMES DAILY AS NEEDED (Patient taking differently: Take 6 mg by mouth every 8 (eight) hours as needed for muscle spasms. ) 810 tablet 3  . torsemide (DEMADEX) 20 MG tablet Take 20 mg by mouth daily.    [name] zolpidem (AMBIEN) 5 MG tablet Take 1-2 tablets (5-10 mg total) by mouth at bedtime as needed for sleep. 30 tablet 2  . chlorpheniramine-HYDROcodone (TUSSIONEX PENNKINETIC ER) 10-8 MG/5ML SUER Take 5 mLs by mouth at bedtime as needed for cough. (Patient not taking: Reported on 09/03/2018) 140 mL 0  . ciprofloxacin (CIPRO) 500 MG tablet Take 1 tablet (500 mg total) by mouth 2 (two) times daily. (Patient not taking: Reported on 09/03/2018) 6 tablet 0  . HYDROcodone-acetaminophen (NORCO) 10-325 MG tablet Take 1-2 tablets by mouth every 4 (four) hours as needed for moderate pain. Maximum dose per 24 hours - 8 pills (Patient not taking: Reported on 09/03/2018) 8 tablet 0   No facility-administered medications prior to visit.     Review of Systems  Review of Systems  Constitutional: Negative.   HENT:  Negative.   Respiratory: Positive for cough. Negative for shortness of breath and wheezing.   Cardiovascular: Negative.     Physical Exam  BP (!) 142/80 (BP Location: Left Arm, Cuff Size: Normal)   Pulse 67   Temp 98.7 F (37.1 C) (Oral)   Ht 5\' 9"  (1.753 m)   Wt 228 lb (103.4 kg)   SpO2 96%   BMI 33.67 kg/m  Physical Exam Constitutional:      Appearance: Normal appearance. He is well-developed. He is not ill-appearing.  HENT:     Head: Normocephalic and atraumatic.     Right Ear: Tympanic membrane normal.     Left Ear: Tympanic membrane normal.     Mouth/Throat:     Mouth: Mucous membranes are moist.     Pharynx: Oropharynx is clear.  Eyes:     Pupils: Pupils are equal, round, and reactive to light.  Neck:     Musculoskeletal: Normal range of motion and neck supple.  Cardiovascular:     Rate and Rhythm: Normal rate and regular rhythm.     Heart sounds: Normal heart sounds.  Pulmonary:     Effort: Pulmonary effort is normal. No respiratory distress.     Breath sounds: Normal breath sounds. No wheezing.  Abdominal:     General: Bowel sounds are normal.     Palpations: Abdomen is soft.     Tenderness: There is no abdominal tenderness.  Musculoskeletal: Normal range of motion.  Skin:    General: Skin is warm and dry.     Findings: No erythema or rash.  Neurological:     General: No focal deficit present.     Mental Status: He is alert. Mental status is at baseline.  Psychiatric:        Mood and Affect: Mood normal.        Behavior: Behavior normal.        Thought Content: Thought content normal.        Judgment: Judgment normal.      Lab Results:  CBC    Component Value Date/Time   WBC 6.3 04/24/2018 1710   RBC 4.02 (L) 04/24/2018 1710   HGB  13.5 04/24/2018 1710   HGB 14.0 05/11/2015 1108   HCT 39.3 04/24/2018 1710   HCT 42.1 05/11/2015 1108   PLT 186 04/24/2018 1710   PLT 126 (L) 05/11/2015 1108   MCV 97.8 04/24/2018 1710   MCV 97.5 05/11/2015 1108     MCH 33.6 (H) 04/24/2018 1710   MCHC 34.4 04/24/2018 1710   RDW 13.8 04/24/2018 1710   RDW 12.9 05/11/2015 1108   LYMPHSABS 2,596 04/24/2018 1710   LYMPHSABS 1.5 05/11/2015 1108   MONOABS 0.6 04/26/2017 2053   MONOABS 0.3 05/11/2015 1108   EOSABS 239 04/24/2018 1710   EOSABS 0.6 (H) 05/11/2015 1108   BASOSABS 38 04/24/2018 1710   BASOSABS 0.0 05/11/2015 1108    BMET    Component Value Date/Time   NA 140 04/24/2018 1710   NA 142 02/24/2013 0848   K 3.8 04/24/2018 1710   K 3.4 (L) 02/24/2013 0848   CL 107 04/24/2018 1710   CO2 29 04/24/2018 1710   CO2 24 02/24/2013 0848   GLUCOSE 92 04/24/2018 1710   GLUCOSE 121 02/24/2013 0848   GLUCOSE 93 03/25/2009   BUN 16 04/24/2018 1710   BUN 11.9 02/24/2013 0848   CREATININE 1.32 04/24/2018 1710   CREATININE 1.3 02/24/2013 0848   CALCIUM 8.9 04/24/2018 1710   CALCIUM 8.9 02/24/2013 0848   GFRNONAA 61 04/24/2018 1710   GFRAA 70 04/24/2018 1710    BNP    Component Value Date/Time   BNP 7.2 11/11/2014 1130    ProBNP No results found for: PROBNP  Imaging: Dg Chest 2 View  Result Date: 09/02/2018 CLINICAL DATA:  Productive cough. EXAM: CHEST - 2 VIEW COMPARISON:  August 07, 2017 FINDINGS: A single nodular density projects over each lung base, likely nipple shadows. The heart, hila, mediastinum, lungs, and pleura are otherwise unremarkable. IMPRESSION: 1. A nodular density projects over each lung base, likely nipple shadows. Repeat imaging with nipple markers could confirm. 2. No other abnormalities in the chest. Electronically Signed   By: Dorise Bullion III M.D   On: 09/02/2018 16:16     Assessment & Plan:   COPD GOLD III - Symptoms consistent with acute COPD exacerbation, no significant wheezing on exam  - Doxycyline 1 tab twice daily x1 week - Tussionex 65ml twice daily as needed for cough, safe precautions and PMP reviewed - Mucinex and delsym cough syrup twice daily  - Continue Dulera 200 2bid; Ventolin hfa every 6  hours prn wheezing/shortness of breath  - Needs repeat CXR with nipple markers  - Requesting labs from Dr. Doylene Canard office    Martyn Ehrich, NP 09/03/2018

## 2018-09-03 NOTE — Patient Instructions (Addendum)
Office testing: - Flu swab was negative   COPD exacerbation: - Doxycyline 1 tab twice daily x1 week - Tussionex 47ml twice daily as needed for cough - Mucinex and delsym cough syrup twice daily  - Continue Dulera twice daily, as needed rescue inhaler every 6 hours for wheezing/shortness of breath   Follow-up: - Return in 5-7 days if symptoms do not improve or worsen  - Needs CXR in 2 weeks (repeat with nipple markers)

## 2018-09-10 ENCOUNTER — Telehealth: Payer: Self-pay | Admitting: Primary Care

## 2018-09-10 ENCOUNTER — Ambulatory Visit (INDEPENDENT_AMBULATORY_CARE_PROVIDER_SITE_OTHER)
Admission: RE | Admit: 2018-09-10 | Discharge: 2018-09-10 | Disposition: A | Discharge status: Home / Self Care | Payer: Medicare Other | Source: Ambulatory Visit | Attending: Primary Care | Admitting: Primary Care

## 2018-09-10 ENCOUNTER — Encounter: Payer: Self-pay | Admitting: Physician Assistant

## 2018-09-10 ENCOUNTER — Encounter: Payer: Self-pay | Admitting: Internal Medicine

## 2018-09-10 ENCOUNTER — Ambulatory Visit (INDEPENDENT_AMBULATORY_CARE_PROVIDER_SITE_OTHER): Payer: Medicare Other | Admitting: Physician Assistant

## 2018-09-10 VITALS — BP 132/82 | HR 88 | Ht 67.0 in | Wt 226.0 lb

## 2018-09-10 DIAGNOSIS — J449 Chronic obstructive pulmonary disease, unspecified: Secondary | ICD-10-CM | POA: Diagnosis not present

## 2018-09-10 DIAGNOSIS — R1314 Dysphagia, pharyngoesophageal phase: Secondary | ICD-10-CM | POA: Diagnosis not present

## 2018-09-10 DIAGNOSIS — R140 Abdominal distension (gaseous): Secondary | ICD-10-CM

## 2018-09-10 DIAGNOSIS — R935 Abnormal findings on diagnostic imaging of other abdominal regions, including retroperitoneum: Secondary | ICD-10-CM

## 2018-09-10 MED ORDER — NA SULFATE-K SULFATE-MG SULF 17.5-3.13-1.6 GM/177ML PO SOLN: 1.0000 | Freq: Once | ORAL | 0 refills | Status: AC

## 2018-09-10 NOTE — Telephone Encounter (Signed)
Order was placed by [name] for pt to have a cxr performed. Called and spoke with April [facility]) letting her know this was correct and stated to her that pt could come to office between now up until 5pm except from 1-2 due to [facility] being at lunch. April expressed understanding and stated they would come to have cxr performed. Nothing further needed.

## 2018-09-10 NOTE — Patient Instructions (Signed)
Continue omeprazole 40 mg daily.   You have been scheduled for an endoscopy and colonoscopy. Please follow the written instructions given to you at your visit today. Please pick up your prep supplies at the pharmacy within the next 1-3 days. If you use inhalers (even only as needed), please bring them with you on the day of your procedure. Your physician has requested that you go to [email or web address] and enter the access code given to you at your visit today. This web site gives a general overview about your procedure. However, you should still follow specific instructions given to you by our office regarding your preparation for the procedure.

## 2018-09-10 NOTE — Progress Notes (Addendum)
Chief Complaint: Bloating, abnormal CT scan, dysphagia  HPI:    Casey Brennan is a 58 year old African-American male with a past medical history of GERD, PUD, HIV, prostate cancer status post prostatectomy, CKD, echo 12/25/2016 with EF 55-60%, known to Dr. Hilarie Fredrickson, who presents to clinic today for bloating, abnormal CT scan, dysphagia.    EGD 07/29/2014 with minimal antral gastritis and small hiatal hernia.  Otherwise normal.  Colonoscopy on the same day with fair prep, medium sized internal hemorrhoids and otherwise normal.    12/25/2016 last office visit to evaluate abdominal distention now and abnormal CT scan of the colon showing left colonic wall thickening.  At that time, it was thought that volume overload could be contributing to his abdominal distention.  It was recommended that he repeat colonoscopy given thickening seen by CT scan.  Also added Benefiber to help with constipation.  He also reported intermittent solid food dysphagia and it was recommended he change his Omeprazole dose to 30 minutes before breakfast.  Also scheduled for repeat EGD. (Patient never had these done)    Today, the patient presents to clinic accompanied by his sister who does assist with his history.  He explains that since seeing Dr. Hilarie Fredrickson he has continued with some abdominal distention noting that he is beginning to look pregnant.  Describes a lot of bloating after eating as well as excess gas.  This is associated with occasional nausea.  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    Also continues to complain of solid food dysphagia which happens 2-3 times a week.  Per his sister this is scary to witness.    Denies fever, chills, weight loss, anorexia, vomiting, blood in his stool or symptoms that awaken him from sleep.     Past Medical History:  Diagnosis Date  . Arthritis    r knee     . Asthma    very rare  . Axillary lymphadenopathy   . Bronchitis   . Chronic anemia    normocytic  . Chronic folliculitis   . Elevated PSA   . Environmental and seasonal allergies   . GERD (gastroesophageal reflux disease)   . Gross hematuria   . H/O pericarditis    2010--  myopercarditis--  resolved  . HCAP (healthcare-associated pneumonia) 11/19/2014  . Headache(784.0)    HX SEVERE FRONTAL HA'S  . Hiatal hernia   . History of concussion    2012  &  2013  RESIDUAL HA'S --  RESOLVED  . History of gastric ulcer   . History of kidney stones   . History of MRSA infection 2010   infected boil  . HIV (human immunodeficiency virus infection) ([place]) 1988  . Hypertension   . Internal hemorrhoids   . Lytic bone lesion of hip    WORK-UP DONE BY ONCOLOGIST DR HA --  NOT MALIGNANT  . Pneumonia    hx of  . Post concussion syndrome    resolved  . Prostate cancer ([place]) 04/25/13   gleason 3+3=6, volume 30 gm  . Sinusitis, chronic 05/14/2015  . Ulcer    hx of gastric  . Wears glasses     Past Surgical History:  Procedure Laterality Date  . CARDIAC CATHETERIZATION  03-23-2009  DR COOPER   NORMAL CORONARY ARTERIES  . CHOLECYSTECTOMY N/A 11/06/2014   Procedure: LAPAROSCOPIC CHOLECYSTECTOMY  WITH INTRAOPERATIVE CHOLANGIOGRAM;  Surgeon: Judeth Horn, MD;  Location: [facility];  Service: General;  Laterality: N/A;  . COLONOSCOPY  12/26/2011   Procedure: COLONOSCOPY;  Surgeon: Lear Ng, MD;  Location: [facility];  Service: Endoscopy;  Laterality: N/A;  . COLONOSCOPY WITH PROPOFOL N/A 07/29/2014   Procedure: COLONOSCOPY WITH PROPOFOL;  Surgeon: Lear Ng, MD;  Location: [facility];  Service: Endoscopy;  Laterality: N/A;  . CYSTOSCOPY N/A 05/18/2018   Procedure: CYSTOSCOPY REMOVAL FOREIGN BODY;  Surgeon: Kathie Rhodes, MD;  [facility]: [facility];  Service: Urology;  Laterality: N/A;  . CYSTOSCOPY/RETROGRADE/URETEROSCOPY Bilateral 04/25/2013   Procedure: CYSTOSCOPY/ BILATERAL  RETROGRADES; BLADDER BIOPSIES;  Surgeon: Alexis Frock, MD;  Location: [facility];  Service: Urology;  Laterality: Bilateral;  . DENTAL EXAMINATION UNDER ANESTHESIA    . ESOPHAGOGASTRODUODENOSCOPY  12/26/2011   Procedure: ESOPHAGOGASTRODUODENOSCOPY (EGD);  Surgeon: Lear Ng, MD;  Location: [facility];  Service: Endoscopy;  Laterality: N/A;  . ESOPHAGOGASTRODUODENOSCOPY (EGD) WITH PROPOFOL N/A 07/29/2014   Procedure: ESOPHAGOGASTRODUODENOSCOPY (EGD) WITH PROPOFOL;  Surgeon: Lear Ng, MD;  Location: [facility];  Service: Endoscopy;  Laterality: N/A;  . EXCISION CHRONIC LEFT BREAST ABSCESS  09-21-2010  . EXCISIONAL BX LEFT BREAST MASS/  I  &  D LEFT BREAST ABSCESS  03-24-2009  . KNEE ARTHROSCOPY Right 1985  . left axilla biopsy  04/2013  . left hip biopsy  10/2012  . LYMPHADENECTOMY Bilateral 08/18/2013   Procedure: LYMPHADENECTOMY "PELVIC LYMPH NODE DISSECTION";  Surgeon: Alexis Frock, MD;  [facility];  Service: Urology;  Laterality: Bilateral;  . PROSTATE BIOPSY N/A 04/25/2013   Procedure: BIOPSY TRANSRECTAL ULTRASONIC PROSTATE (TUBP);  Surgeon: Alexis Frock, MD;  [facility]  Service: Urology;  Laterality: N/A;  . ROBOT ASSISTED LAPAROSCOPIC RADICAL PROSTATECTOMY N/A 08/18/2013   Procedure: ROBOTIC ASSISTED LAPAROSCOPIC RADICAL PROSTATECTOMY;  Surgeon: Alexis Frock, MD;  [facility];  Service: Urology;  Laterality: N/A;  . TOTAL KNEE ARTHROPLASTY Right 08/01/2016   08/17/16  . TOTAL KNEE ARTHROPLASTY Right 08/01/2016   Procedure: TOTAL KNEE ARTHROPLASTY;  Surgeon: Renette Butters, MD;  Location: [facility];  Service: Orthopedics;  Laterality: Right;  . UPPER LEG SOFT TISSUE BIOPSY Left 2012   lthigh    Current Outpatient Medications  Medication Sig Dispense Refill  . albuterol (VENTOLIN HFA) 108 (90 Base) MCG/ACT inhaler INHALE 2 PUFFS INTO THE LUNGS EVERY 6 HOURS AS NEEDED FOR WHEEZING OR SHORTNESS OF BREATH 36 g 3   . ALPRAZolam (XANAX) 0.5 MG tablet TAKE 1 TABLET BY MOUTH AT BEDTIME AS NEEDED FOR ANXIETY 30 tablet 5  . aspirin EC 81 MG tablet Take 81 mg by mouth every Monday, Wednesday, and Friday.     . chlorpheniramine-HYDROcodone (TUSSIONEX PENNKINETIC ER) 10-8 MG/5ML SUER Take 5 mLs by mouth 2 (two) times daily. 140 mL 0  . darunavir (PREZISTA) 800 MG tablet TAKE 1 TABLET(800 MG) BY MOUTH DAILY 90 tablet 1  . diclofenac sodium (VOLTAREN) 1 % GEL APPLY 2 GRAMS EXTERNALLY TO THE AFFECTED AREA TWICE DAILY AS NEEDED FOR PAIN 100 g 2  . doxycycline (VIBRA-TABS) 100 MG tablet Take 1 tablet (100 mg total) by mouth 2 (two) times daily. 14 tablet 0  . emtricitabine-tenofovir (TRUVADA) 200-300 MG tablet Take 1 tablet by mouth daily. 90 tablet 3  . gabapentin (NEURONTIN) 300 MG capsule Take 1 capsule (300 mg total) by mouth 4 (four) times daily. (Patient taking differently: Take 300 mg by mouth 4 (four) times daily as  needed (pain). ) 120 capsule 11  . hydrOXYzine (ATARAX/VISTARIL) 25 MG tablet TAKE 1 TABLET(25 MG) BY MOUTH EVERY NIGHT 90 tablet 2  . ipratropium (ATROVENT) 0.02 % nebulizer solution USE 2.5 ML(0.5 MG) VIA NEBULIZER THREE TIMES DAILY AS NEEDED FOR WHEEZING OR SHORTNESS OF BREATH (Patient taking differently: Take 0.5 mg by nebulization 3 (three) times daily as needed for wheezing or shortness of breath. ) 1650 mL 1  . ISENTRESS 400 MG tablet TAKE 2 TABLETS(800 MG) BY MOUTH AT BEDTIME 180 tablet 1  . l-methylfolate-B6-B12 (METANX) 3-35-2 MG TABS tablet Take 1 tablet by mouth daily. 90 tablet 0  . metoprolol succinate (TOPROL-XL) 25 MG 24 hr tablet Take by mouth.    . mometasone-formoterol (DULERA) 200-5 MCG/ACT AERO Take 2 puffs first thing in am and then another 2 puffs about 12 hours later. (Patient taking differently: Inhale 1 puff into the lungs every 12 (twelve) hours. ) 1 Inhaler 11  . montelukast (SINGULAIR) 10 MG tablet TAKE 1 TABLET(10 MG) BY MOUTH AT BEDTIME 30 tablet 3  . Multiple Vitamin  (MULTIVITAMIN WITH MINERALS) TABS tablet Take 1 tablet by mouth daily after breakfast.     . mupirocin ointment (BACTROBAN) 2 % APPLY EXTERNALLY TO THE AFFECTED AREA TWICE DAILY FOR 3 WEEKS, PLACE COTTON BETWEEN TOES TO ALLOW BETTER AERATION AS NEEDED 66 g 0  . NORVIR 100 MG TABS tablet Take 1 tablet (100 mg total) by mouth at bedtime. 90 tablet 1  . omeprazole (PRILOSEC) 40 MG capsule TAKE 1 CAPSULE(40 MG) BY MOUTH EVERY MORNING 30 capsule 1  . ondansetron (ZOFRAN ODT) 8 MG disintegrating tablet Take 1 tablet (8 mg total) by mouth every 8 (eight) hours as needed for nausea or vomiting. 60 tablet 3  . Polyethyl Glycol-Propyl Glycol (SYSTANE) 0.4-0.3 % GEL ophthalmic gel Place 1 application into both eyes 2 (two) times daily.     . SUMAtriptan (IMITREX) 100 MG tablet Take 1 tablet (100 mg total) by mouth every 2 (two) hours as needed for migraine. May repeat in 2 hours if headache persists or recurs. 10 tablet 2  . tiZANidine (ZANAFLEX) 2 MG tablet TAKE 3 TABLETS BY MOUTH THREE TIMES DAILY AS NEEDED (Patient taking differently: Take 6 mg by mouth every 8 (eight) hours as needed for muscle spasms. ) 810 tablet 3  . torsemide (DEMADEX) 20 MG tablet Take 20 mg by mouth daily.    [name] zolpidem (AMBIEN) 5 MG tablet Take 1-2 tablets (5-10 mg total) by mouth at bedtime as needed for sleep. 30 tablet 2   No current facility-administered medications for this visit.     Allergies as of 09/10/2018 - Review Complete 09/10/2018  Allergen Reaction Noted  . Chocolate Shortness Of Breath and Other (See Comments) 05/22/2016  . Descovy [emtricitabine-tenofovir af] Shortness Of Breath 08/31/2017  . Genvoya [elviteg-cobic-emtricit-tenofaf] Shortness Of Breath 08/31/2017  . Morphine and related Shortness Of Breath, Itching, and Other (See Comments) 06/13/2013  . Penicillins Shortness Of Breath and Rash   . Sulfa antibiotics Anaphylaxis 10/17/2015  . Tomato Shortness Of Breath 05/22/2016  . Vancomycin Shortness Of  Breath 09/25/2016  . Flagyl [metronidazole] Itching 09/25/2016  . Toradol [ketorolac tromethamine] Nausea And Vomiting 11/12/2017  . Tramadol Itching and Nausea And Vomiting 07/28/2014    Family History  Problem Relation Age of Onset  . Stroke Father   . Diabetes Father   . Cancer Father        brain cancer  . Asthma Father   . Hypertension  Sister   . Cancer Maternal Uncle        prostate cancer  . Asthma Sister     Social History   Socioeconomic History  . Marital status: Single    Spouse name: Not on file  . Number of children: 1  . Years of education: College  . Highest education level: Not on file  Occupational History  . 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 Occupation: disability  Social Needs  . Financial resource strain: Not on file  . Food insecurity:    Worry: Not on file    Inability: Not on file  . Transportation needs:    Medical: Not on file    Non-medical: Not on file  Tobacco Use  . Smoking status: Former Smoker    Packs/day: 1.50    Years: 23.00    Pack years: 34.50    Types: Cigars, Cigarettes    Last attempt to quit: 05/22/2010    Years since quitting: 8.3  . Smokeless tobacco: Never Used  Substance and Sexual Activity  . Alcohol use: No    Alcohol/week: 0.0 standard drinks  . Drug use: No  . Sexual activity: Not Currently    Birth control/protection: Condom    Comment: pt. declined condoms  Lifestyle  . Physical activity:    Days per week: Not on file    Minutes per session: Not on file  . Stress: Not on file  Relationships  . Social connections:    Talks on phone: Not on file    Gets together: Not on file    Attends religious service: Not on file    Active member of club or organization: Not on file    Attends meetings of clubs or organizations: Not on file    Relationship status: Not on file  . Intimate partner violence:    Fear of current or ex partner: Not on file    Emotionally abused: Not  on file    Physically abused: Not on file    Forced sexual activity: Not on file  Other Topics Concern  . Not on file  Social History Narrative   Sister April stays with him currently although he hopes to return to truck driving. 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 Sister is starting medical school next year.   Patient is divorced and lives with his sister.   Patient has one adult son.   Patient is part-time, driving a truck.   Patient has a college education.   Patient is right-handed.   Patient drinks 2-3 cups of coffee and a 4-5 cups of tea.    Review of Systems:    Constitutional: No weight loss, fever or chills Cardiovascular: No chest pain Respiratory: No SOB  Gastrointestinal: See HPI and otherwise negative   Physical Exam:  Vital signs: BP 132/82 (BP Location: Left Arm, Patient Position: Sitting, Cuff Size: Normal)   Pulse 88   Ht 5\' 7"  (1.702 m) Comment: height measured without shoes  Wt 226 lb (102.5 kg)   BMI 35.40 kg/m   Constitutional:   Pleasant AA male appears to be in NAD, Well developed, Well nourished, alert and cooperative Respiratory: Respirations even and unlabored. Lungs clear to auscultation bilaterally.   No wheezes, crackles, or rhonchi.  Cardiovascular: Normal S1, S2. No MRG. Regular rate and rhythm. No peripheral edema, cyanosis or pallor.  Gastrointestinal:  Soft, mild distension, nontender. No rebound or guarding. Normal  bowel sounds. No appreciable masses or hepatomegaly. Psychiatric: Demonstrates good judgement and reason without abnormal affect or behaviors.  MOST RECENT LABS AND IMAGING: CBC    Component Value Date/Time   WBC 6.3 04/24/2018 1710   RBC 4.02 (L) 04/24/2018 1710   HGB 13.5 04/24/2018 1710   HGB 14.0 05/11/2015 1108   HCT 39.3 04/24/2018 1710   HCT 42.1 05/11/2015 1108   PLT 186 04/24/2018 1710   PLT 126 (L) 05/11/2015 1108   MCV 97.8 04/24/2018 1710   MCV 97.5 05/11/2015 1108   MCH 33.6 (H) 04/24/2018 1710   MCHC  34.4 04/24/2018 1710   RDW 13.8 04/24/2018 1710   RDW 12.9 05/11/2015 1108   LYMPHSABS 2,596 04/24/2018 1710   LYMPHSABS 1.5 05/11/2015 1108   MONOABS 0.6 04/26/2017 2053   MONOABS 0.3 05/11/2015 1108   EOSABS 239 04/24/2018 1710   EOSABS 0.6 (H) 05/11/2015 1108   BASOSABS 38 04/24/2018 1710   BASOSABS 0.0 05/11/2015 1108    CMP     Component Value Date/Time   NA 140 04/24/2018 1710   NA 142 02/24/2013 0848   K 3.8 04/24/2018 1710   K 3.4 (L) 02/24/2013 0848   CL 107 04/24/2018 1710   CO2 29 04/24/2018 1710   CO2 24 02/24/2013 0848   GLUCOSE 92 04/24/2018 1710   GLUCOSE 121 02/24/2013 0848   GLUCOSE 93 03/25/2009   BUN 16 04/24/2018 1710   BUN 11.9 02/24/2013 0848   CREATININE 1.32 04/24/2018 1710   CREATININE 1.3 02/24/2013 0848   CALCIUM 8.9 04/24/2018 1710   CALCIUM 8.9 02/24/2013 0848   PROT 6.6 04/24/2018 1710   PROT 7.1 02/24/2013 0848   ALBUMIN 3.9 04/26/2017 2053   ALBUMIN 3.6 02/24/2013 0848   AST 15 04/24/2018 1710   AST 14 02/24/2013 0848   ALT 17 04/24/2018 1710   ALT 11 02/24/2013 0848   ALKPHOS 99 04/26/2017 2053   ALKPHOS 94 02/24/2013 0848   BILITOT 0.5 04/24/2018 1710   BILITOT 1.40 (H) 02/24/2013 0848   GFRNONAA 61 04/24/2018 1710   GFRAA 70 04/24/2018 1710    Assessment: 1.  Abnormal CT scan of the abdomen: From June 2018, thickening in the colon, colonoscopy recommended at that time, never completed due to patient schedule, now with increased abdominal bloating 2.  Bloating: As above 3.  Dysphagia: Solid food dysphagia, EGD recommended in the past to consider esophageal stricture versus other  Plan: 1.  Scheduled patient for an EGD and colonoscopy in the [place] with Dr. Hilarie Fredrickson.  Did discuss risks, benefits, limitations and alternatives and the patient agrees to proceed. 2.  Continue Omeprazole 40 mg daily for now. 3.  Patient to follow in clinic per recommendations from Dr. Hilarie Fredrickson after time of procedure.  Ellouise Newer, PA-C [facility] 09/10/2018, 10:03 AM  Addendum: Reviewed and agree with assessment and management plan. Pyrtle, Lajuan Lines, MD

## 2018-09-11 ENCOUNTER — Telehealth: Payer: Self-pay | Admitting: Internal Medicine

## 2018-09-11 NOTE — Telephone Encounter (Signed)
Called and LVM for the patient to advise that if his doctors office is part of the Epic system they would already have access to his test results.  Called Dr. Dr. Merrilee Jansky office and they confirmed the doctor has access to Epic, but their office is on paper charts and does not use Epic. Advised a copy will be faxed for their records.

## 2018-09-11 NOTE — Telephone Encounter (Signed)
Called and spoke with patient's sister, she is requesting results from cxr. Results were given below. Patient's sister verbalized understanding. Nothing further needed.  Notes recorded by Martyn Ehrich, NP on 09/11/2018 at 8:58 AM EDT Please let patient know CXR looked fine with nipple markers, no artifact

## 2018-09-13 DIAGNOSIS — H5213 Myopia, bilateral: Secondary | ICD-10-CM | POA: Insufficient documentation

## 2018-09-19 NOTE — Progress Notes (Signed)
Chart and office note reviewed in detail  > agree with a/p as outlined    

## 2018-10-01 ENCOUNTER — Other Ambulatory Visit: Payer: Self-pay | Admitting: Infectious Diseases

## 2018-10-01 ENCOUNTER — Telehealth: Payer: Self-pay | Admitting: Internal Medicine

## 2018-10-01 DIAGNOSIS — J302 Other seasonal allergic rhinitis: Secondary | ICD-10-CM

## 2018-10-01 MED ORDER — PREDNISONE 10 MG PO TABS: ORAL_TABLET | ORAL | 0 refills | Status: DC

## 2018-10-01 NOTE — Telephone Encounter (Signed)
Please see telephone note, I signed by accident.

## 2018-10-01 NOTE — Telephone Encounter (Signed)
He needs to self isolate d/t recent travel. Monitor temperature 2-3 times a day. If develops temp >100 please let us know. No ibuprofen, take tylenol if needed. Avoid direct contact with others until you are free from fever for 3 days and free from respiratory symptoms for 7 days. ED if symptoms are worsening or not improving, if fever does not break with tylenol, if experiences worsening shortness of breath or if you are unable to take in oral fluids  Otherwise, recommend patient take antihistamine such as Claritin/zyrtec daily and Flonase nasal spray. I do not think he needs additional antibiotic based on that face that he is afebrile and most recent CXR was normal. I will send in prednisone course. Use inhalers as prescribed.    Please mail patient below [place] educational material to patient  Coronavirus (COVID-19) Are you at risk?  Are you at risk for the Coronavirus (COVID-19)?  To be considered HIGH RISK for Coronavirus (COVID-19), you have to meet the following criteria:  . Traveled to [place], [place], [place], [place] or [place]; or in the [place] to [place], [place], [place], or [place]; and have fever, cough, and shortness of breath within the last 2 weeks of travel OR . Been in close contact with a person diagnosed with COVID-19 within the last 2 weeks and have fever, cough, and shortness of breath . IF YOU DO NOT MEET THESE CRITERIA, YOU ARE CONSIDERED LOW RISK FOR COVID-19.  What to do if you are HIGH RISK for COVID-19?  [name] If you are having a medical emergency, call 911. . Seek medical care right away. Before you go to a doctor's office, urgent care or emergency department, call ahead and tell them about your recent travel, contact with someone diagnosed with COVID-19, and your symptoms. You should receive instructions from your physician's office regarding next steps of care.  . When you arrive at healthcare provider, tell the healthcare staff immediately you have  returned from visiting [place], [place], [place], [place] or [place]; or traveled in the [place] to [place], [place], [place], or [place]; in the last two weeks or you have been in close contact with a person diagnosed with COVID-19 in the last 2 weeks.   . Tell the health care staff about your symptoms: fever, cough and shortness of breath. . After you have been seen by a medical provider, you will be either: o Tested for (COVID-19) and discharged home on quarantine except to seek medical care if symptoms worsen, and asked to  - Stay home and avoid contact with others until you get your results (4-5 days)  - Avoid travel on public transportation if possible (such as bus, train, or airplane) or o Sent to the Emergency Department by EMS for evaluation, COVID-19 testing, and possible admission depending on your condition and test results.  What to do if you are LOW RISK for COVID-19?  Reduce your risk of any infection by using the same precautions used for avoiding the common cold or flu:  [name] Wash your hands often with soap and warm water for at least 20 seconds.  If soap and water are not readily available, use an alcohol-based hand sanitizer with at least 60% alcohol.  . If coughing or sneezing, cover your mouth and nose by coughing or sneezing into the elbow areas of your shirt or coat, into a tissue or into your sleeve (not your hands). . Avoid shaking hands with others and consider head  nods or verbal greetings only. . Avoid touching your eyes, nose, or mouth with unwashed hands.  . Avoid close contact with people who are sick. . Avoid places or events with large numbers of people in one location, like concerts or sporting events. . Carefully consider travel plans you have or are making. . If you are planning any travel outside or inside the [place], visit the CDC's Travelers' Health webpage for the latest health notices. . If you have some symptoms but not all symptoms, continue to  monitor at home and seek medical attention if your symptoms worsen. . If you are having a medical emergency, call 911.   [place] / e-Visit: [email or web address]         [facility]: [facility]: [phone]                   [facility]: [phone]

## 2018-10-01 NOTE — Telephone Encounter (Signed)
Called and spoke with pt letting him know the info stated by [name]. Stated to him that a pred Rx was called into pharmacy for him by [facility] and also stated to him that I was going to send the coronavirus material to his active mychart account.  Pt wanted to know if he should notify his work about him needing to self-isolate as he said his job is expecting him to go back out to [place] this Friday, 4/3. Pt needs to know if it is okay or if he should get a letter from [place] for him to give to his work.  Beth, please advise on this for pt. Thanks!

## 2018-10-01 NOTE — Telephone Encounter (Signed)
Routing to BW who saw pt last.   Sounds reasonable for patient to isolate. Also would recommend that he check his temperature.   Aaron Edelman

## 2018-10-01 NOTE — Telephone Encounter (Signed)
Primary Pulmonologist: Dr. Melvyn Novas  Last office visit and with whom: [name] NP 09/03/18 What do we see them for (pulmonary problems): COPD GOLD III  Reason for call: patient is not feeling well. Patient is struggling with breathing when he exerts hisself. Patient traveled to [facility] 2 days ago and he has been sneezing, scratchy sore throat, coughing up thick yellow mucus. Patient denies fever. Patient has body chills and aches.   In the last month, have you been in contact with someone who was confirmed or suspected to have Conoravirus / COVID-19?  Patient stated no  Have you traveled internationally or to an area with more than 100 reported cases of Coronavirus / COVID-19?   Patient has traveled to [place], [place], [place], [place], [place], [place], [place].   Do you have any of the following symptoms developed in the last 30 days? Fever: patient stated no Cough: yes Shortness of breath: yes  When did your symptoms start?  2 days ago after visiting [facility]   If the patient has a fever, what is the last reading?  (use n/a if patient denies fever)  N/A . IF THE PATIENT STATES THEY DO NOT OWN A THERMOMETER, THEY MUST GO AND PURCHASE ONE When did the fever start?: N/A Have you taken any medication to suppress a fever (ie Ibuprofen, Aleve, Tylenol)?: N/A  TRIAGE :: REMIND PATIENT TO SELF-ISOLATE WHILE THEY'RE MESSAGE IS BEING HANDLED  (examples of things to ask: : When did symptoms start? Fever? Cough? Productive? Color to sputum? More sputum than usual? Wheezing? Are you having any chest pain?  Have you needed increased oxygen? Are you taking your respiratory medications? What over the counter measures have you tried?)

## 2018-10-01 NOTE — Telephone Encounter (Signed)
Based on his symptoms I recommend he self isolate until he is free from symptoms for 7 days. I can provide a note TOMORROW. He would be placing others at risk if he continued to travel and would be placing himself at risk as well.

## 2018-10-02 NOTE — Telephone Encounter (Signed)
Please provide patient with note for work  Patient recently called office with symptoms concerning for potential COVID due to recent travel. Patient is high risk for potential complications from VBTYO-06 if contracted due to history of severe COPD. He needs to be free from fever for 3 days AND respiratory symptoms for 7 days before returning to work. It is recommended patient work from home for the next TWO weeks. Any further questions please contact [facility].

## 2018-10-02 NOTE — Telephone Encounter (Signed)
Sent info through pt's mychart account that was stated by [name] and stated to pt that we will get a letter written for him to give to his employer. Routing to [place] in regards to the letter.

## 2018-10-02 NOTE — Telephone Encounter (Signed)
Sent letter through my chart and will mail also.  Nothing further needed.

## 2018-10-02 NOTE — Telephone Encounter (Signed)
Attempted to call pt to let him know the info provided by [facility] about the letter but unable to reach him. Left message for pt to return call.

## 2018-10-07 ENCOUNTER — Telehealth: Payer: Self-pay | Admitting: Internal Medicine

## 2018-10-07 NOTE — Telephone Encounter (Signed)
Letter faxed to [facility] [phone].  Scripts put in for Rio to sign.  Nothing further is needed.

## 2018-10-07 NOTE — Telephone Encounter (Signed)
forwarding to my nurse lisa

## 2018-10-07 NOTE — Telephone Encounter (Signed)
Pt requesting letter be faxed to his employer. They were not able to view via mychart nor have they received letter sent in the mail. Also patient requesting refills on Dulera and Tussionex. Pt still has a productive cough with thick yellow mucus. He says Prednisone is helping with SOB. He has no other pulm sx.

## 2018-10-08 NOTE — Telephone Encounter (Signed)
Beth please sign.

## 2018-10-09 ENCOUNTER — Other Ambulatory Visit: Payer: Self-pay

## 2018-10-09 MED ORDER — MOMETASONE FURO-FORMOTEROL FUM 200-5 MCG/ACT IN AERO: INHALATION_SPRAY | RESPIRATORY_TRACT | 6 refills | Status: DC

## 2018-10-09 MED ORDER — HYDROCOD POLST-CPM POLST ER 10-8 MG/5ML PO SUER: 5.0000 mL | Freq: Two times a day (BID) | ORAL | 0 refills | Status: DC

## 2018-10-09 MED ORDER — MOMETASONE FURO-FORMOTEROL FUM 200-5 MCG/ACT IN AERO: INHALATION_SPRAY | RESPIRATORY_TRACT | 11 refills | Status: DC

## 2018-10-09 NOTE — Telephone Encounter (Signed)
Sign what? I have signed everything I knew of. Happy to do what every you need, just send it my way.

## 2018-10-09 NOTE — Telephone Encounter (Signed)
There are meds that are awaiting to be sent to pharmacy by you, one being tussionex. Demetrio Lapping pended Dulera and Tussionex since the Tussionex needs to be signed using the fingerprint by you.

## 2018-10-10 NOTE — Telephone Encounter (Signed)
The orders have been signed by EW and sent to pharmacy Called and spoke with patient; he is aware they are sent to pharmacy Pt verbalized and expressed understanding Nothing further needed at this time.

## 2018-10-16 ENCOUNTER — Telehealth: Payer: Self-pay | Admitting: Pulmonary Disease

## 2018-10-16 NOTE — Telephone Encounter (Signed)
Set up ov with cxr but will need to wear to wear facemask like every one else as we don't know for sure when the shedding of the virus ends - I can cover him with a work note for whatever he needs at the time of the ov to go back to work the following day.

## 2018-10-16 NOTE — Telephone Encounter (Signed)
Call returned to patient sister April, patient was taken out of work due to symptoms similar to Covid-19 (see documentation) she states he is still coughing but it has improved. No fevers. No SOB. He does report he feels better and ready to go back to work. She is requesting a CXR and clearance to go back to work. They would like the patient evaluated before returning to work. Made aware I would touch basis with a provider and get back with them.   TN please advise. If patient needs CXR will set up a in office visit since he will already be here.

## 2018-10-16 NOTE — Telephone Encounter (Signed)
Routed to Dr. Melvyn Novas per Lazaro Arms, NP request. Dr. Melvyn Novas please advise on this message.  Thank you

## 2018-10-16 NOTE — Telephone Encounter (Signed)
Please forward to Dr. Melvyn Novas. He will be back in office tomorrow and can advise. Thanks.

## 2018-10-16 NOTE — Telephone Encounter (Signed)
Spoke with the pt and notified will need ov with cxr and wear mask  Pt verbalized understanding Appt scheduled with Dr Melvyn Novas for 2 pm tomorrow

## 2018-10-17 ENCOUNTER — Encounter: Payer: Self-pay | Admitting: *Deleted

## 2018-10-17 ENCOUNTER — Ambulatory Visit (INDEPENDENT_AMBULATORY_CARE_PROVIDER_SITE_OTHER): Payer: Medicare Other

## 2018-10-17 ENCOUNTER — Encounter: Payer: Self-pay | Admitting: Internal Medicine

## 2018-10-17 ENCOUNTER — Ambulatory Visit (INDEPENDENT_AMBULATORY_CARE_PROVIDER_SITE_OTHER): Payer: Medicare Other | Admitting: Internal Medicine

## 2018-10-17 ENCOUNTER — Other Ambulatory Visit: Payer: Self-pay | Admitting: Internal Medicine

## 2018-10-17 ENCOUNTER — Other Ambulatory Visit: Payer: Self-pay

## 2018-10-17 VITALS — BP 124/90 | HR 73 | Temp 98.1°F | Ht 69.0 in | Wt 225.8 lb

## 2018-10-17 DIAGNOSIS — J449 Chronic obstructive pulmonary disease, unspecified: Secondary | ICD-10-CM | POA: Diagnosis not present

## 2018-10-17 MED ORDER — MOMETASONE FURO-FORMOTEROL FUM 200-5 MCG/ACT IN AERO: INHALATION_SPRAY | RESPIRATORY_TRACT | 0 refills | Status: DC

## 2018-10-17 NOTE — Patient Instructions (Addendum)
Plan A = Automatic = Dulera 200 Take 2 puffs first thing in am and then another 2 puffs about 12 hours later.   Work on inhaler technique:  relax and gently blow all the way out then take a nice smooth deep breath back in, triggering the inhaler at same time you start breathing in.  Hold for up to 5 seconds if you can. Blow out thru nose. Rinse and gargle with water when done     Plan B = Backup Only use your albuterol inhaler(Ventolin)  as a rescue medication to be used if you can't catch your breath by resting or doing a relaxed purse lip breathing pattern.  - The less you use it, the better it will work when you need it. - Ok to use the inhaler up to 2 puffs  every 4 hours if you must but call for appointment if use goes up over your usual need - Don't leave home without it !!  (think of it like the spare tire for your car)   Plan C = Crisis - only use your albuterol nebulizer if you first try Plan B and it fails to help > ok to use the nebulizer up to every 4 hours but if start needing it regularly call for immediate appointment   For cough > Take  delsym 1-2 tsp every 12 hours as needed (stop tussionex)    Ok to return to work 10/23/2018 without restrictions   Please schedule a follow up visit in 3 months but call sooner if needed for pfts on return with inhalers in hand

## 2018-10-17 NOTE — Progress Notes (Signed)
Subjective:    Patient ID: Casey Brennan, male    DOB: 21-Jan-1961,   MRN: [id number]   Brief patient profile:  58 yobm quit smoking 2011 dx with HIV 1988 referred to pulmonary clinic 07/22/2014 by Dr [name] for variable sob/ ? Asthma vs copd .    History of Present Illness  07/22/2014 1st [facility] office visit/ Casey Brennan   Chief Complaint  Patient presents with  . Pulmonary Consult    Referred by Dr. Bobby Rumpf. Pt was dxed with asthma in 1966. He has had PNA multiple times. Pt c/o increased SOB and wheezing for the past 2 months. He has occ cough which is non prod. He also c/o "pounding pain" in chest for the past 2 wks.   breathing hasn't been nl since 2012 and using a lot of saba but not whether using maint rx or not > rx qvar  By [employer] but did not have apparent impact on saba need rec Prilosec 40 mg   Take 30-60 min before first meal of the day and Pepcid 20 mg one bedtime  GERD diet   Dulera 200 Take 2 puffs first thing in am and then another 2 puffs about 12 hours later > fill the prescription only if you are happy with it.  Work on inhaler technique:  Only use your xopenex  as a rescue medication  Only use nebulizer if you already try the puffer and it doesn't work If not happy return in 2 weeks, otherwise follow up is as needed or per Dr [name]     09/03/2018  Casey Brennan re cough x mid feb 2020 while in [place] testing: - Flu swab was negative   COPD exacerbation: - Doxycyline 1 tab twice daily x1 week - Tussionex 84ml twice daily as needed for cough - Mucinex and delsym cough syrup twice daily  - Continue Dulera twice daily, as needed rescue inhaler every 6 hours for wheezing/shortness of breath        10/17/2018  f/u ov/Casey Brennan re: GOLD III copd s/p viral infection/aecopd/ needs work Brewing technologist Complaint  Patient presents with  . Follow-up    CXR repeated today.    Dyspnea:  Push lawn mower x  1 hour s stopping slow pace  Cough:  resolved  Sleeping: able to lie flat / still using tussionex at hs/ denies daytime drowsiness  SABA use: still using twice weekly hfa/no longer needs nebulizer   02: none  Note easily confused with names of meds/ maint vs prns   No obvious day to day or daytime variability or assoc excess/ purulent sputum or mucus plugs or hemoptysis or cp or chest tightness, subjective wheeze or overt sinus or hb symptoms.   Sleeping as above without nocturnal  or early am exacerbation  of respiratory  c/o's or need for noct saba. Also denies any obvious fluctuation of symptoms with weather or environmental changes or other aggravating or alleviating factors except as outlined above   No unusual exposure hx or h/o childhood pna/ asthma or knowledge of premature birth.  Current Allergies, Complete Past Medical History, Past Surgical History, Family History, and Social History were reviewed in [employer] record.  ROS  The following are not active complaints unless bolded Hoarseness, sore throat, dysphagia, dental problems, itching, sneezing,  nasal congestion or discharge of excess mucus or purulent secretions, ear ache,   fever, chills, sweats, unintended wt loss or wt gain, classically  pleuritic or exertional cp,  orthopnea pnd or arm/hand swelling  or leg swelling, presyncope, palpitations, abdominal pain, anorexia, nausea, vomiting, diarrhea  or change in bowel habits or change in bladder habits, change in stools or change in urine, dysuria, hematuria,  rash, arthralgias, visual complaints, headache, numbness, weakness or ataxia or problems with walking or coordination,  change in mood or  memory.        Current Meds  Medication Sig  . albuterol (VENTOLIN HFA) 108 (90 Base) MCG/ACT inhaler INHALE 2 PUFFS INTO THE LUNGS EVERY 6 HOURS AS NEEDED FOR WHEEZING OR SHORTNESS OF BREATH  . ALPRAZolam (XANAX) 0.5 MG tablet TAKE 1 TABLET BY MOUTH AT BEDTIME AS NEEDED FOR ANXIETY  . aspirin EC  81 MG tablet Take 81 mg by mouth every Monday, Wednesday, and Friday.   . chlorpheniramine-HYDROcodone (TUSSIONEX PENNKINETIC ER) 10-8 MG/5ML SUER Take 5 mLs by mouth 2 (two) times daily.  . darunavir (PREZISTA) 800 MG tablet TAKE 1 TABLET(800 MG) BY MOUTH DAILY  . diclofenac sodium (VOLTAREN) 1 % GEL APPLY 2 GRAMS EXTERNALLY TO THE AFFECTED AREA TWICE DAILY AS NEEDED FOR PAIN  . emtricitabine-tenofovir (TRUVADA) 200-300 MG tablet Take 1 tablet by mouth daily.  [name] gabapentin (NEURONTIN) 300 MG capsule Take 1 capsule (300 mg total) by mouth 4 (four) times daily. (Patient taking differently: Take 300 mg by mouth 4 (four) times daily as needed (pain). )  . hydrOXYzine (ATARAX/VISTARIL) 25 MG tablet TAKE 1 TABLET(25 MG) BY MOUTH EVERY NIGHT  . ipratropium (ATROVENT) 0.02 % nebulizer solution USE 2.5 ML(0.5 MG) VIA NEBULIZER THREE TIMES DAILY AS NEEDED FOR WHEEZING OR SHORTNESS OF BREATH (Patient taking differently: Take 0.5 mg by nebulization 3 (three) times daily as needed for wheezing or shortness of breath. )  . ISENTRESS 400 MG tablet TAKE 2 TABLETS(800 MG) BY MOUTH AT BEDTIME  . l-methylfolate-B6-B12 (METANX) 3-35-2 MG TABS tablet Take 1 tablet by mouth daily.  . meclizine (ANTIVERT) 25 MG tablet Take 1 tablet by mouth 2 (two) times daily.  . meloxicam (MOBIC) 15 MG tablet Take 1 tablet by mouth daily.  . metoprolol succinate (TOPROL-XL) 25 MG 24 hr tablet Take by mouth.  . mometasone-formoterol (DULERA) 200-5 MCG/ACT AERO Take 2 puffs first thing in am and then another 2 puffs about 12 hours later.  . montelukast (SINGULAIR) 10 MG tablet TAKE 1 TABLET(10 MG) BY MOUTH AT BEDTIME  . Multiple Vitamin (MULTIVITAMIN WITH MINERALS) TABS tablet Take 1 tablet by mouth daily after breakfast.   . mupirocin ointment (BACTROBAN) 2 % APPLY EXTERNALLY TO THE AFFECTED AREA TWICE DAILY FOR 3 WEEKS, PLACE COTTON BETWEEN TOES TO ALLOW BETTER AERATION AS NEEDED  . naproxen (NAPROSYN) 500 MG tablet Take 1 tablet by  mouth 2 (two) times daily.  . NORVIR 100 MG TABS tablet Take 1 tablet (100 mg total) by mouth at bedtime.  [name] omeprazole (PRILOSEC) 40 MG capsule TAKE 1 CAPSULE(40 MG) BY MOUTH EVERY MORNING  . ondansetron (ZOFRAN ODT) 8 MG disintegrating tablet Take 1 tablet (8 mg total) by mouth every 8 (eight) hours as needed for nausea or vomiting.  Vladimir Faster Glycol-Propyl Glycol (SYSTANE) 0.4-0.3 % GEL ophthalmic gel Place 1 application into both eyes 2 (two) times daily.   . SUMAtriptan (IMITREX) 100 MG tablet Take 1 tablet (100 mg total) by mouth every 2 (two) hours as needed for migraine. May repeat in 2 hours if headache persists or recurs.  . tizanidine (ZANAFLEX) 6 MG capsule Take 1 capsule by  mouth 3 (three) times daily as needed.  . torsemide (DEMADEX) 20 MG tablet Take 20 mg by mouth daily.  [name] zolpidem (AMBIEN) 5 MG tablet Take 1-2 tablets (5-10 mg total) by mouth at bedtime as needed for sleep.                        Objective:      amb bm nad   Wt Readings from Last 3 Encounters:  10/17/18 225 lb 12.8 oz (102.4 kg)  09/10/18 226 lb (102.5 kg)  09/03/18 228 lb (103.4 kg)    Vital signs reviewed - Note on arrival 02 sats  97% on RA   HEENT: nl dentition / oropharynx. Nl external ear canals without cough reflex -  Mild bilateral non-specific turbinate edema     NECK :  without JVD/Nodes/TM/ nl carotid upstrokes bilaterally   LUNGS: no acc muscle use,  Mild barrel  contour chest wall with bilateral  Distant bs s audible wheeze and  without cough on insp or exp maneuver and mild  Hyperresonant  to  percussion bilaterally     CV:  RRR  no s3 or murmur or increase in P2, and no edema   ABD:  soft and nontender with pos end  insp Hoover's  in the supine position. No bruits or organomegaly appreciated, bowel sounds nl  MS:   Nl gait/  ext warm without deformities, calf tenderness, cyanosis or clubbing No obvious joint restrictions   SKIN: warm and dry without lesions    NEURO:   alert, approp, nl sensorium with  no motor or cerebellar deficits apparent.          CXR PA and Lateral:   10/17/2018 :    I personally reviewed images -  impression as follows:   Mild copd changes, nothing acute                Assessment & Plan:

## 2018-10-18 ENCOUNTER — Encounter: Payer: Self-pay | Admitting: Internal Medicine

## 2018-10-18 NOTE — Assessment & Plan Note (Addendum)
Quit smoking 2011 - spirometry 07/22/2014  FEV1  1.12 (34%) ratio 59  - 07/22/2014  Walked RA  2 laps @ 185 ft each stopped due to  Too tired to complete, no sob or tachypnea/ no desats at nl pace  - 07/22/2014  try dulera 200   2bid  - 10/17/2018  After extensive coaching inhaler device,  effectiveness =    75% from a baseline of 50% > continue dulera 200 2bid   Adequate control on present rx, reviewed in detail with pt > no change in rx needed  Other than d/c tussionex and just use delsym prn at this point  Concerned about baseline hfa and confusion between inhalers same color so showed him sample of dulera and picture of 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   Ok to return to work s restrictions, no concern re covid-19 risk of spreading (I don't think that's what he had and we can't test for it anyway at this point including the ab) but needs to follow national /state guidelines.  F/u 3 m with pfts rec   I had an extended discussion with the patient reviewing all relevant studies completed to date and  lasting 15 to 20 minutes of a 25 minute visit       Each maintenance medication was reviewed in detail including emphasizing most importantly the difference between maintenance and prns and under what circumstances the prns are to be triggered using an action plan format that is not reflected in the computer generated alphabetically organized AVS which I have not found useful in most complex patients, especially with respiratory illnesses  Please see AVS for specific instructions unique to this visit that I personally wrote and verbalized to the the pt in detail and then reviewed with pt  by my nurse highlighting any  changes in therapy recommended at today's visit to their plan of care.        [name]

## 2018-10-18 NOTE — Progress Notes (Signed)
Left detailed msg ok per DPR

## 2018-10-21 ENCOUNTER — Telehealth: Payer: Self-pay | Admitting: *Deleted

## 2018-10-21 NOTE — Telephone Encounter (Signed)
LMOM

## 2018-10-21 NOTE — Telephone Encounter (Signed)
Covid-19 travel screening questions  Have you traveled in the last 14 days? no If yes where?  Do you now or have you had a fever in the last 14 days?no  Do you have any respiratory symptoms of shortness of breath or cough now or in the last 14 days? no  Do you have any family members or close contacts with diagnosed or suspected Covid-19?no  Pt is aware that his carepartner will be asked to wait in the car during his procedure

## 2018-10-22 ENCOUNTER — Encounter: Payer: Medicare Other | Admitting: Internal Medicine

## 2018-10-24 ENCOUNTER — Telehealth: Payer: Self-pay | Admitting: *Deleted

## 2018-10-24 NOTE — Telephone Encounter (Signed)
I have left a message for patient to call back. Need to find out how he is doing. Has he been fever free for 72 hours? If he is feeling better, need to reschedule his endo/colon to Tuesday, 4/28 with Dr Hilarie Fredrickson!

## 2018-10-24 NOTE — Telephone Encounter (Signed)
Jerene Bears, MD  Larina Bras, CMA        Pt was for [facility] today, but canceled due to fever  Please try and reschedule procedures as they are high priority  I have time next Thursday if he has been fever free x 72 hours, otherwise let's discuss timing  Thank you

## 2018-10-28 NOTE — Telephone Encounter (Signed)
Left message for patient to call back  

## 2018-10-28 NOTE — Telephone Encounter (Deleted)
10/28/18 

## 2018-10-30 NOTE — Telephone Encounter (Signed)
Left voicemail for patient to call back. 

## 2018-10-31 NOTE — Telephone Encounter (Signed)
Unfortunately, we have not heard back from patient. I have sent a letter to patient's home address asking that he contact our office to reschedule procedures.

## 2018-11-12 ENCOUNTER — Telehealth: Payer: Self-pay | Admitting: *Deleted

## 2018-11-12 NOTE — Telephone Encounter (Signed)
Received fax from [facility] drug utilzation review.  Showing tizanidine 6mg  (1 cap TID) filled on 10-09-18 #270 (90 day) [facility] [id number].   [phone].  Also tizanidine 4mg  filled 11-05-18 #90 (30 day) by Laroy Apple MD (murphy wainer)  [phone].  [facility] [phone].

## 2018-11-14 ENCOUNTER — Ambulatory Visit: Payer: Medicare Other | Admitting: Nurse Practitioner

## 2018-11-14 ENCOUNTER — Telehealth: Payer: Self-pay | Admitting: *Deleted

## 2018-11-14 NOTE — Telephone Encounter (Signed)
Attempted to reach pt to RS his ECL with Dr Hilarie Fredrickson for dysphagia and abnormal CT scan- no answer- LM to Return call- D Tamala Julian CMA has also tried several times to reach pt and has mailed a letter to his home address to call and RS. Marie pV

## 2018-11-15 ENCOUNTER — Other Ambulatory Visit: Payer: Self-pay | Admitting: Infectious Diseases

## 2018-11-15 DIAGNOSIS — J302 Other seasonal allergic rhinitis: Secondary | ICD-10-CM

## 2018-11-18 ENCOUNTER — Other Ambulatory Visit: Payer: Self-pay | Admitting: Infectious Diseases

## 2018-11-18 DIAGNOSIS — J302 Other seasonal allergic rhinitis: Secondary | ICD-10-CM

## 2018-11-28 ENCOUNTER — Encounter: Payer: Self-pay | Admitting: Internal Medicine

## 2018-11-28 ENCOUNTER — Ambulatory Visit (AMBULATORY_SURGERY_CENTER): Payer: Self-pay | Admitting: *Deleted

## 2018-11-28 ENCOUNTER — Other Ambulatory Visit: Payer: Self-pay

## 2018-11-28 VITALS — Ht 69.0 in | Wt 218.0 lb

## 2018-11-28 DIAGNOSIS — R935 Abnormal findings on diagnostic imaging of other abdominal regions, including retroperitoneum: Secondary | ICD-10-CM

## 2018-11-28 DIAGNOSIS — R1310 Dysphagia, unspecified: Secondary | ICD-10-CM

## 2018-11-28 MED ORDER — SUPREP BOWEL PREP KIT 17.5-3.13-1.6 GM/177ML PO SOLN: 1.0000 | Freq: Once | ORAL | 0 refills | Status: AC

## 2018-11-28 NOTE — Progress Notes (Signed)
PREVISIT PER TELEPHONE. SISTER, APRIL IS THE PATIENT'S POA, DISCUSSED PATIENT WITH APRIL. No egg or soy allergy known to patient  No issues with past sedation with any surgeries  or procedures, no intubation problems  No diet pills per patient No home 02 use per patient  No blood thinners per patient  Pt denies issues with constipation  No A fib or A flutter  EMMI video INFORMATION INCLUDED IN PACKET. SUPREP COUPON INCLUDED.

## 2018-12-01 ENCOUNTER — Other Ambulatory Visit: Payer: Self-pay | Admitting: Infectious Diseases

## 2018-12-01 DIAGNOSIS — G43009 Migraine without aura, not intractable, without status migrainosus: Secondary | ICD-10-CM

## 2018-12-02 ENCOUNTER — Other Ambulatory Visit: Payer: Self-pay

## 2018-12-02 DIAGNOSIS — G609 Hereditary and idiopathic neuropathy, unspecified: Secondary | ICD-10-CM

## 2018-12-02 DIAGNOSIS — B20 Human immunodeficiency virus [HIV] disease: Secondary | ICD-10-CM

## 2018-12-02 MED ORDER — DARUNAVIR ETHANOLATE 800 MG PO TABS: ORAL_TABLET | ORAL | 0 refills | Status: DC

## 2018-12-02 MED ORDER — NORVIR 100 MG PO TABS: 100.0000 mg | ORAL_TABLET | Freq: Every day | ORAL | 0 refills | Status: DC

## 2018-12-02 MED ORDER — EMTRICITABINE-TENOFOVIR DF 200-300 MG PO TABS: 1.0000 | ORAL_TABLET | Freq: Every day | ORAL | 0 refills | Status: DC

## 2018-12-02 MED ORDER — ISENTRESS 400 MG PO TABS: ORAL_TABLET | ORAL | 0 refills | Status: DC

## 2018-12-03 ENCOUNTER — Telehealth: Payer: Self-pay | Admitting: *Deleted

## 2018-12-03 NOTE — Telephone Encounter (Signed)
Link has been sent to the patient's e-mail.

## 2018-12-03 NOTE — Telephone Encounter (Signed)
Called pt. Due to current COVID 19 pandemic, our office is severely reducing in office visits until further notice, in order to minimize the risk to our patients and healthcare providers.  Pt understands that although there may be some limitations with this type of visit, we will take all precautions to reduce any security or privacy concerns.  Pt understands that this will be treated like an in office visit and we will file with pt's insurance, and there may be a patient responsible charge related to this service. Consented to [email or web address] visit. email sent to [email or web address].

## 2018-12-04 ENCOUNTER — Encounter: Payer: Self-pay | Admitting: Family Medicine

## 2018-12-04 ENCOUNTER — Other Ambulatory Visit: Payer: Self-pay

## 2018-12-04 ENCOUNTER — Ambulatory Visit (INDEPENDENT_AMBULATORY_CARE_PROVIDER_SITE_OTHER): Payer: Medicare Other | Admitting: Family Medicine

## 2018-12-04 ENCOUNTER — Other Ambulatory Visit: Payer: Medicare Other

## 2018-12-04 ENCOUNTER — Telehealth: Payer: Self-pay

## 2018-12-04 DIAGNOSIS — G44209 Tension-type headache, unspecified, not intractable: Secondary | ICD-10-CM | POA: Diagnosis not present

## 2018-12-04 DIAGNOSIS — B20 Human immunodeficiency virus [HIV] disease: Secondary | ICD-10-CM

## 2018-12-04 DIAGNOSIS — G4700 Insomnia, unspecified: Secondary | ICD-10-CM | POA: Diagnosis not present

## 2018-12-04 DIAGNOSIS — R252 Cramp and spasm: Secondary | ICD-10-CM | POA: Diagnosis not present

## 2018-12-04 DIAGNOSIS — G63 Polyneuropathy in diseases classified elsewhere: Secondary | ICD-10-CM

## 2018-12-04 HISTORY — DX: Human immunodeficiency virus (HIV) disease: B20

## 2018-12-04 MED ORDER — ALPRAZOLAM 0.5 MG PO TABS: ORAL_TABLET | ORAL | 0 refills | Status: DC

## 2018-12-04 MED ORDER — TIZANIDINE HCL 6 MG PO CAPS: 6.0000 mg | ORAL_CAPSULE | Freq: Three times a day (TID) | ORAL | 11 refills | Status: DC

## 2018-12-04 MED ORDER — GABAPENTIN 300 MG PO CAPS: 300.0000 mg | ORAL_CAPSULE | Freq: Four times a day (QID) | ORAL | 11 refills | Status: DC | PRN

## 2018-12-04 NOTE — Telephone Encounter (Signed)
Received PA request for patient's Norvir 100 mg tab. Contact Pa department to begin this process. PA was submitted with clinical questions answered. PA currently under clinical review, this should take up to 24 hours before office is notified with answer.  PA department: [phone] Reference # [id number] Aundria Rud, [place]
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
## 2018-12-04 NOTE — Telephone Encounter (Signed)
[facility] Database Verified LR: 11/16/2018 Qty: 30 Pending appointment: No pending appt.

## 2018-12-05 ENCOUNTER — Telehealth: Payer: Self-pay | Admitting: Family Medicine

## 2018-12-05 DIAGNOSIS — G44209 Tension-type headache, unspecified, not intractable: Secondary | ICD-10-CM

## 2018-12-05 DIAGNOSIS — R252 Cramp and spasm: Secondary | ICD-10-CM

## 2018-12-05 LAB — T-HELPER CELL (CD4) - (RCID CLINIC ONLY)
CD4 % Helper T Cell: 29 % — ABNORMAL LOW (ref 33–65)
CD4 T Cell Abs: 525 /uL (ref 400–1790)

## 2018-12-05 NOTE — Telephone Encounter (Signed)
It is supposed to be four times daily so 120 is correct. Did they pick up prescription? If so We can call to correct quantity for next refill. If I need to redo, please send refill request.

## 2018-12-05 NOTE — Telephone Encounter (Signed)
pts sister called in and stated that the quantity of his tizanidine (ZANAFLEX) 6 MG capsule needs to be 120 instead of 90

## 2018-12-05 NOTE — Telephone Encounter (Signed)
Spoke with the sister and she stated that she has already picked up the current prescription with the 90 tablets. I advised her that we would contact the pharmacy in regards to his next refill she was appreciative for the phone call.

## 2018-12-05 NOTE — Telephone Encounter (Signed)
Brand Norvir tablet was denied because it is not on plan's Drug list. You need to first try Norvir solution or ritonavir tablet.  Routing to provider to make aware.  Eugenia Mcalpine, LPN

## 2018-12-09 NOTE — Telephone Encounter (Signed)
Thanks

## 2018-12-11 LAB — CBC WITH DIFFERENTIAL/PLATELET
Absolute Monocytes: 590 cells/uL (ref 200–950)
Basophils Absolute: 47 cells/uL (ref 0–200)
Basophils Relative: 0.7 %
Eosinophils Absolute: 322 cells/uL (ref 15–500)
Eosinophils Relative: 4.8 %
HCT: 37.6 % — ABNORMAL LOW (ref 38.5–50.0)
Hemoglobin: 12.3 g/dL — ABNORMAL LOW (ref 13.2–17.1)
Lymphs Abs: 1990 cells/uL (ref 850–3900)
MCH: 32 pg (ref 27.0–33.0)
MCHC: 32.7 g/dL (ref 32.0–36.0)
MCV: 97.9 fL (ref 80.0–100.0)
MPV: 12.6 fL — ABNORMAL HIGH (ref 7.5–12.5)
Monocytes Relative: 8.8 %
Neutro Abs: 3752 cells/uL (ref 1500–7800)
Neutrophils Relative %: 56 %
Platelets: 181 10*3/uL (ref 140–400)
RBC: 3.84 10*6/uL — ABNORMAL LOW (ref 4.20–5.80)
RDW: 12.8 % (ref 11.0–15.0)
Total Lymphocyte: 29.7 %
WBC: 6.7 10*3/uL (ref 3.8–10.8)

## 2018-12-11 LAB — COMPLETE METABOLIC PANEL WITH GFR
AG Ratio: 1.6 (calc) (ref 1.0–2.5)
ALT: 13 U/L (ref 9–46)
AST: 18 U/L (ref 10–35)
Albumin: 4.1 g/dL (ref 3.6–5.1)
Alkaline phosphatase (APISO): 100 U/L (ref 35–144)
BUN: 14 mg/dL (ref 7–25)
CO2: 26 mmol/L (ref 20–32)
Calcium: 9.2 mg/dL (ref 8.6–10.3)
Chloride: 107 mmol/L (ref 98–110)
Creat: 1.16 mg/dL (ref 0.70–1.33)
GFR, Est African American: 82 mL/min/{1.73_m2} (ref >=60)
GFR, Est Non African American: 70 mL/min/{1.73_m2} (ref >=60)
Globulin: 2.6 g/dL (calc) (ref 1.9–3.7)
Glucose, Bld: 103 mg/dL — ABNORMAL HIGH (ref 65–99)
Potassium: 4 mmol/L (ref 3.5–5.3)
Sodium: 139 mmol/L (ref 135–146)
Total Bilirubin: 0.8 mg/dL (ref 0.2–1.2)
Total Protein: 6.7 g/dL (ref 6.1–8.1)

## 2018-12-11 LAB — HIV-1 RNA QUANT-NO REFLEX-BLD
HIV 1 RNA Quant: <20 copies/mL
HIV-1 RNA Quant, Log: <1.3 Log copies/mL

## 2018-12-12 ENCOUNTER — Other Ambulatory Visit: Payer: Self-pay

## 2018-12-12 ENCOUNTER — Encounter: Payer: Medicare Other | Admitting: Internal Medicine

## 2018-12-12 DIAGNOSIS — B20 Human immunodeficiency virus [HIV] disease: Secondary | ICD-10-CM

## 2018-12-12 MED ORDER — RITONAVIR 100 MG PO TABS: 100.0000 mg | ORAL_TABLET | Freq: Every day | ORAL | 0 refills | Status: DC

## 2018-12-13 NOTE — Telephone Encounter (Signed)
Name brand norvir denied by insurance, prior authorization refused.  Per insurance, patient need to fill generic ritonavir in place of Norvir. Pharmacy notified.

## 2018-12-18 ENCOUNTER — Telehealth: Payer: Self-pay | Admitting: Internal Medicine

## 2018-12-18 NOTE — Telephone Encounter (Signed)
Covid-19 Screening Questions: °  °Do you now or have you had a fever in the last 14 days? No  °  °Do you have any respiratory symptoms of shortness of breath or cough now or in the last 14 days? No °  °Do you have any family members or close contacts with diagnosed or suspected Covid-19 in the past 14 days? No  °  °Have you been tested for Covid-19 and found to be positive? No ° °Pt made aware of that care partner may wait in the car or come up to the lobby during the procedure but will need to provide their own mask. °  ° °

## 2018-12-19 ENCOUNTER — Other Ambulatory Visit: Payer: Self-pay

## 2018-12-19 ENCOUNTER — Ambulatory Visit: Payer: Medicare Other

## 2018-12-19 ENCOUNTER — Encounter: Payer: Self-pay | Admitting: Internal Medicine

## 2018-12-19 ENCOUNTER — Ambulatory Visit: Payer: Medicare Other | Admitting: Family

## 2018-12-19 ENCOUNTER — Ambulatory Visit (AMBULATORY_SURGERY_CENTER): Payer: Medicare Other | Admitting: Internal Medicine

## 2018-12-19 VITALS — BP 135/96 | HR 87 | Temp 98.5°F | Resp 13 | Ht 69.0 in | Wt 225.0 lb

## 2018-12-19 DIAGNOSIS — R935 Abnormal findings on diagnostic imaging of other abdominal regions, including retroperitoneum: Secondary | ICD-10-CM

## 2018-12-19 DIAGNOSIS — K222 Esophageal obstruction: Secondary | ICD-10-CM | POA: Diagnosis not present

## 2018-12-19 DIAGNOSIS — R1310 Dysphagia, unspecified: Secondary | ICD-10-CM | POA: Diagnosis not present

## 2018-12-19 MED ORDER — SODIUM CHLORIDE 0.9 % IV SOLN: 500.0000 mL | Freq: Once | INTRAVENOUS | Status: DC

## 2018-12-19 NOTE — Patient Instructions (Signed)
Impression/Recommendations:  Resume previous diet. Continue present medications.  Repeat upper endoscopy at appointment to be scheduled because the preparation was poor.  Colonoscopy can be rescheduled along with EGD.  Colonoscopy not attempted due to large amount of food in the stomach, and the expectation with food that the preparation would not be adequate.  YOU HAD AN ENDOSCOPIC PROCEDURE TODAY AT [facility]:   Refer to the procedure report that was given to you for any specific questions about what was found during the examination.  If the procedure report does not answer your questions, please call your gastroenterologist to clarify.  If you requested that your care partner not be given the details of your procedure findings, then the procedure report has been included in a sealed envelope for you to review at your convenience later.  YOU SHOULD EXPECT: Some feelings of bloating in the abdomen. Passage of more gas than usual.  Walking can help get rid of the air that was put into your GI tract during the procedure and reduce the bloating. If you had a lower endoscopy (such as a colonoscopy or flexible sigmoidoscopy) you may notice spotting of blood in your stool or on the toilet paper. If you underwent a bowel prep for your procedure, you may not have a normal bowel movement for a few days.  Please Note:  You might notice some irritation and congestion in your nose or some drainage.  This is from the oxygen used during your procedure.  There is no need for concern and it should clear up in a day or so.  SYMPTOMS TO REPORT IMMEDIATELY:   Following upper endoscopy (EGD)  Vomiting of blood or coffee ground material  New chest pain or pain under the shoulder blades  Painful or persistently difficult swallowing  New shortness of breath  Fever of 100F or higher  Black, tarry-looking stools  For urgent or emergent issues, a gastroenterologist can be reached at any hour by  calling [phone].   DIET:  We do recommend a small meal at first, but then you may proceed to your regular diet.  Drink plenty of fluids but you should avoid alcoholic beverages for 24 hours.  ACTIVITY:  You should plan to take it easy for the rest of today and you should NOT DRIVE or use heavy machinery until tomorrow (because of the sedation medicines used during the test).    FOLLOW UP: Our staff will call the number listed on your records 48-72 hours following your procedure to check on you and address any questions or concerns that you may have regarding the information given to you following your procedure. If we do not reach you, we will leave a message.  We will attempt to reach you two times.  During this call, we will ask if you have developed any symptoms of COVID 19. If you develop any symptoms (ie: fever, flu-like symptoms, shortness of breath, cough etc.) before then, please call [phone].  If you test positive for Covid 19 in the 2 weeks post procedure, please call and report this information to [place].    If any biopsies were taken you will be contacted by phone or by letter within the next 1-3 weeks.  Please call us at [phone] if you have not heard about the biopsies in 3 weeks.    SIGNATURES/CONFIDENTIALITY: You and/or your care partner have signed paperwork which will be entered into your electronic medical record.  These signatures attest to the  fact that that the information above on your After Visit Summary has been reviewed and is understood.  Full responsibility of the confidentiality of this discharge information lies with you and/or your care-partner.

## 2018-12-19 NOTE — Progress Notes (Signed)
PT taken to PACU. Monitors in place. VSS. Report given to RN. 

## 2018-12-19 NOTE — Op Note (Signed)
[facility] Patient Name: Casey Brennan Procedure Date: 12/19/2018 9:00 AM MRN: [id number] Endoscopist: Jerene Bears , MD Age: 58 Referring MD:  Date of Birth: 1960/12/10 Gender: Male Account #: [id number] Procedure:                Upper GI endoscopy Indications:              Dysphagia Medicines:                Monitored Anesthesia Care Procedure:                Pre-Anesthesia Assessment:                           - Prior to the procedure, a History and Physical                            was performed, and patient medications and                            allergies were reviewed. The patient's tolerance of                            previous anesthesia was also reviewed. The risks                            and benefits of the procedure and the sedation                            options and risks were discussed with the patient.                            All questions were answered, and informed consent                            was obtained. Prior Anticoagulants: The patient has                            taken no previous anticoagulant or antiplatelet                            agents. ASA Grade Assessment: III - A patient with                            severe systemic disease. After reviewing the risks                            and benefits, the patient was deemed in                            satisfactory condition to undergo the procedure.                           After obtaining informed consent, the endoscope was  passed under direct vision. Throughout the                            procedure, the patient's blood pressure, pulse, and                            oxygen saturations were monitored continuously. The                            Endoscope was introduced through the mouth, and                            advanced to the second part of duodenum. The upper                            GI endoscopy was accomplished without  difficulty.                            The patient tolerated the procedure well. Scope In: Scope Out: Findings:                 Normal mucosa was found in the entire esophagus.                           A low-grade of narrowing Schatzki ring was found at                            the gastroesophageal junction. Savary dilation was                            considered, but there was free reflux observed and                            with stomach full of retained food, it was not felt                            safe to length the procedure with dilation given                            concern for regurgitation and aspiration.                           A large amount of food (residue) was found in the                            entire examined stomach. Food precluded                            visualization of most of the gastric mucosa.                           The examined duodenum was normal. Complications:            No immediate complications. Estimated Blood Loss:  Estimated blood loss: none. Impression:               - Normal mucosa was found in the entire esophagus.                           - Low-grade of narrowing Schatzki ring. Dilation                            not possible today due to retained gastric contents.                           - A large amount of food (residue) in the stomach.                            Highly suggestive of gastroparesis.                           - Normal examined duodenum.                           - No specimens collected. Recommendation:           - Patient has a contact number available for                            emergencies. The signs and symptoms of potential                            delayed complications were discussed with the                            patient. Return to normal activities tomorrow.                            Written discharge instructions were provided to the                            patient.                            - Resume previous diet.                           - Continue present medications.                           - Repeat upper endoscopy at appointment to be                            scheduled because the preparation was poor.                           - Colonoscopy planned today was not attempted due                            to large amount of food in the  stomach and                            expectation with food present the preparation would                            not be adequate. Colonoscopy can be rescheduled                            along with EGD. Jerene Bears, MD 12/19/2018 9:19:36 AM This report has been signed electronically.

## 2018-12-20 ENCOUNTER — Telehealth: Payer: Self-pay | Admitting: *Deleted

## 2018-12-20 NOTE — Telephone Encounter (Signed)
plann to proceed with planned Previsit as scheduled for 1300 on Monday, 12/23/2018. 2 prep instructions are placed dependent on what patient already has at home.

## 2018-12-20 NOTE — Telephone Encounter (Signed)
Called sister regarding previsit scheduled for 12/23/2018. Sister stating prior to leaving on 12/19/2018 they were given instructions and a sample prep.Informed Rosalyn Charters RN , Previsit  Team Leader if Previsit needs to continue as planned.

## 2018-12-23 ENCOUNTER — Other Ambulatory Visit: Payer: Self-pay

## 2018-12-23 ENCOUNTER — Telehealth: Payer: Self-pay

## 2018-12-23 ENCOUNTER — Ambulatory Visit (AMBULATORY_SURGERY_CENTER): Payer: Self-pay

## 2018-12-23 VITALS — Ht 69.0 in | Wt 214.0 lb

## 2018-12-23 DIAGNOSIS — R935 Abnormal findings on diagnostic imaging of other abdominal regions, including retroperitoneum: Secondary | ICD-10-CM

## 2018-12-23 DIAGNOSIS — R1310 Dysphagia, unspecified: Secondary | ICD-10-CM

## 2018-12-23 MED ORDER — NA SULFATE-K SULFATE-MG SULF 17.5-3.13-1.6 GM/177ML PO SOLN: 1.0000 | Freq: Once | ORAL | 0 refills | Status: AC

## 2018-12-23 NOTE — Progress Notes (Signed)
Denies allergies to eggs or soy products. Denies complication of anesthesia or sedation. Denies use of weight loss medication. Denies use of O2.   Emmi instructions given for colonoscopy.  Pre-Visit was conducted by phone due Covid 19. Instructions were reviewed with patient and mailed to his confirmed home address. Patient was also instructed that Prep instructions were in My Chart. Patient was encouraged to call if he had any questions or concerns regarding instructions. A sample of Suprep was given to the patient.

## 2018-12-23 NOTE — Telephone Encounter (Signed)
  Follow up Call-  Call back number 12/19/2018  Post procedure Call Back phone  # [phone]  Permission to leave phone message Yes  Some recent data might be hidden     Patient questions:  Do you have a fever, pain , or abdominal swelling? No. Pain Score  0 *  Have you tolerated food without any problems? Yes.    Have you been able to return to your normal activities? Yes.    Do you have any questions about your discharge instructions: Diet   No. Medications  No. Follow up visit  No.  Do you have questions or concerns about your Care? No.  Actions: * If pain score is 4 or above: No action needed, pain <4.  1. Have you developed a fever since your procedure? no  2.   Have you had an respiratory symptoms (SOB or cough) since your procedure? no  3.   Have you tested positive for COVID 19 since your procedure no  4.   Have you had any family members/close contacts diagnosed with the COVID 19 since your procedure? no   If yes to any of these questions please route to Joylene [name], RN and Alphonsa Gin, Therapist, sports.

## 2018-12-25 ENCOUNTER — Telehealth: Payer: Self-pay | Admitting: Internal Medicine

## 2018-12-25 NOTE — Telephone Encounter (Signed)
Spoke w/patient regarding Covid-19 screening questions °Covid-19 Screening Questions: ° °Do you now or have you had a fever in the last 14 days? no ° °Do you have any respiratory symptoms of shortness of breath or cough now or in the last 14 days? no ° °Do you have any family members or close contacts with diagnosed or suspected Covid-19 in the past 14 days? no ° °Have you been tested for Covid-19 and found to be positive? no ° °Pt made aware of that care partner may wait in the car or come up to the lobby during the procedure but will need to provide their own mask. °

## 2018-12-26 ENCOUNTER — Encounter: Payer: Self-pay | Admitting: Internal Medicine

## 2018-12-26 ENCOUNTER — Ambulatory Visit (AMBULATORY_SURGERY_CENTER): Payer: Medicare Other | Admitting: Internal Medicine

## 2018-12-26 ENCOUNTER — Other Ambulatory Visit: Payer: Self-pay

## 2018-12-26 VITALS — BP 126/86 | HR 72 | Temp 98.8°F | Resp 20 | Ht 69.0 in | Wt 214.0 lb

## 2018-12-26 DIAGNOSIS — K621 Rectal polyp: Secondary | ICD-10-CM

## 2018-12-26 DIAGNOSIS — R1310 Dysphagia, unspecified: Secondary | ICD-10-CM

## 2018-12-26 DIAGNOSIS — K222 Esophageal obstruction: Secondary | ICD-10-CM

## 2018-12-26 DIAGNOSIS — K2970 Gastritis, unspecified, without bleeding: Secondary | ICD-10-CM | POA: Diagnosis not present

## 2018-12-26 DIAGNOSIS — R1319 Other dysphagia: Secondary | ICD-10-CM

## 2018-12-26 DIAGNOSIS — D128 Benign neoplasm of rectum: Secondary | ICD-10-CM

## 2018-12-26 DIAGNOSIS — K2990 Gastroduodenitis, unspecified, without bleeding: Secondary | ICD-10-CM

## 2018-12-26 DIAGNOSIS — K3189 Other diseases of stomach and duodenum: Secondary | ICD-10-CM

## 2018-12-26 DIAGNOSIS — R935 Abnormal findings on diagnostic imaging of other abdominal regions, including retroperitoneum: Secondary | ICD-10-CM

## 2018-12-26 MED ORDER — SODIUM CHLORIDE 0.9 % IV SOLN: 500.0000 mL | Freq: Once | INTRAVENOUS | Status: DC

## 2018-12-26 NOTE — Op Note (Signed)
[facility] Patient Name: Casey Brennan Procedure Date: 12/26/2018 3:10 PM MRN: [id number] Endoscopist: Jerene Bears , MD Age: 58 Referring MD:  Date of Birth: [birth year] Gender: Male Account #: [id number] Procedure:                Upper GI endoscopy Indications:              Dysphagia Medicines:                Monitored Anesthesia Care Procedure:                Pre-Anesthesia Assessment:                           - Prior to the procedure, a History and Physical                            was performed, and patient medications and                            allergies were reviewed. The patient's tolerance of                            previous anesthesia was also reviewed. The risks                            and benefits of the procedure and the sedation                            options and risks were discussed with the patient.                            All questions were answered, and informed consent                            was obtained. Prior Anticoagulants: The patient has                            taken no previous anticoagulant or antiplatelet                            agents. ASA Grade Assessment: III - A patient with                            severe systemic disease. After reviewing the risks                            and benefits, the patient was deemed in                            satisfactory condition to undergo the procedure.                           After obtaining informed consent, the endoscope was  passed under direct vision. Throughout the                            procedure, the patient's blood pressure, pulse, and                            oxygen saturations were monitored continuously. The                            Model GIF-HQ190 [id number]) scope was introduced                            through the mouth, and advanced to the second part                            of duodenum. The upper GI endoscopy was                          accomplished without difficulty. The patient                            tolerated the procedure well. Scope In: Scope Out: Findings:                 The examined esophagus was normal.                           A low-grade of narrowing Schatzki ring was found at                            the gastroesophageal junction. A TTS dilator was                            passed through the scope. Dilation with a 16-17-18                            mm balloon dilator was performed to 18 mm.                           Localized mild inflammation characterized by                            erythema was found in the gastric antrum. Biopsies                            were taken with a cold forceps for histology and                            Helicobacter pylori testing.                           The cardia and gastric fundus were normal on                            retroflexion.  The examined duodenum was normal. Complications:            No immediate complications. Estimated Blood Loss:     Estimated blood loss was minimal. Impression:               - Normal esophagus.                           - Low-grade of narrowing Schatzki ring. Dilated to                            18 mm with balloon.                           - Mild gastritis. Biopsied.                           - Normal examined duodenum. Recommendation:           - Patient has a contact number available for                            emergencies. The signs and symptoms of potential                            delayed complications were discussed with the                            patient. Return to normal activities tomorrow.                            Written discharge instructions were provided to the                            patient.                           - Resume previous diet.                           - Continue present medications.                           - Await pathology  results. Jerene Bears, MD 12/26/2018 4:09:32 PM This report has been signed electronically.

## 2018-12-26 NOTE — Progress Notes (Signed)
Pt's states no medical or surgical changes since previsit or office visit. 

## 2018-12-26 NOTE — Op Note (Signed)
[facility] Patient Name: Casey Brennan Procedure Date: 12/26/2018 3:10 PM MRN: [id number] Endoscopist: Jerene Bears , MD Age: 58 Referring MD:  Date of Birth: 1961-06-20 Gender: Male Account #: [id number] Procedure:                Colonoscopy Indications:              Abnormal CT of the GI tract (sigmoid and                            rectosigmoid thickening), abdominal bloating, last                            colonoscopy 2016 (Dr. Michail Sermon) Medicines:                Monitored Anesthesia Care Procedure:                Pre-Anesthesia Assessment:                           - Prior to the procedure, a History and Physical                            was performed, and patient medications and                            allergies were reviewed. The patient's tolerance of                            previous anesthesia was also reviewed. The risks                            and benefits of the procedure and the sedation                            options and risks were discussed with the patient.                            All questions were answered, and informed consent                            was obtained. Prior Anticoagulants: The patient has                            taken no previous anticoagulant or antiplatelet                            agents. ASA Grade Assessment: III - A patient with                            severe systemic disease. After reviewing the risks                            and benefits, the patient was deemed in  satisfactory condition to undergo the procedure.                           After obtaining informed consent, the colonoscope                            was passed under direct vision. Throughout the                            procedure, the patient's blood pressure, pulse, and                            oxygen saturations were monitored continuously. The                            Colonoscope was introduced through  the anus and                            advanced to the terminal ileum. The colonoscopy was                            performed without difficulty. The patient tolerated                            the procedure well. The quality of the bowel                            preparation was good. Scope In: 3:35:49 PM Scope Out: 3:54:47 PM Scope Withdrawal Time: 0 hours 14 minutes 57 seconds  Total Procedure Duration: 0 hours 18 minutes 58 seconds  Findings:                 The terminal ileum appeared normal.                           A 4 mm polyp was found in the rectum. The polyp was                            sessile. The polyp was removed with a cold snare.                            Resection and retrieval were complete.                           A single (solitary) ten mm scar was found in the                            mid rectum. Unclear etiology, query prior rectal                            ulceration. No bleeding was present. Biopsies were                            taken with a cold forceps for  histology (from edges                            and center of scar).                           The exam was otherwise without abnormality. Complications:            No immediate complications. Estimated Blood Loss:     Estimated blood loss was minimal. Impression:               - The examined portion of the ileum was normal.                           - One 4 mm polyp in the rectum, removed with a cold                            snare. Resected and retrieved.                           - A single scar in the mid rectum. Biopsied.                           - The examination was otherwise normal. Recommendation:           - Patient has a contact number available for                            emergencies. The signs and symptoms of potential                            delayed complications were discussed with the                            patient. Return to normal activities tomorrow.                             Written discharge instructions were provided to the                            patient.                           - Resume previous diet.                           - Continue present medications.                           - Await pathology results.                           - Repeat colonoscopy is recommended. The                            colonoscopy date will be determined after pathology  results from today's exam become available for                            review. Jerene Bears, MD 12/26/2018 4:18:43 PM This report has been signed electronically.

## 2018-12-26 NOTE — Patient Instructions (Signed)
Handouts given for stricture, gastritis and polyps.  YOU HAD AN ENDOSCOPIC PROCEDURE TODAY AT [facility]:   Refer to the procedure report that was given to you for any specific questions about what was found during the examination.  If the procedure report does not answer your questions, please call your gastroenterologist to clarify.  If you requested that your care partner not be given the details of your procedure findings, then the procedure report has been included in a sealed envelope for you to review at your convenience later.  YOU SHOULD EXPECT: Some feelings of bloating in the abdomen. Passage of more gas than usual.  Walking can help get rid of the air that was put into your GI tract during the procedure and reduce the bloating. If you had a lower endoscopy (such as a colonoscopy or flexible sigmoidoscopy) you may notice spotting of blood in your stool or on the toilet paper. If you underwent a bowel prep for your procedure, you may not have a normal bowel movement for a few days.  Please Note:  You might notice some irritation and congestion in your nose or some drainage.  This is from the oxygen used during your procedure.  There is no need for concern and it should clear up in a day or so.  SYMPTOMS TO REPORT IMMEDIATELY:   Following lower endoscopy (colonoscopy or flexible sigmoidoscopy):  Excessive amounts of blood in the stool  Significant tenderness or worsening of abdominal pains  Swelling of the abdomen that is new, acute  Fever of 100F or higher   Following upper endoscopy (EGD)  Vomiting of blood or coffee ground material  New chest pain or pain under the shoulder blades  Painful or persistently difficult swallowing  New shortness of breath  Fever of 100F or higher  Black, tarry-looking stools  For urgent or emergent issues, a gastroenterologist can be reached at any hour by calling [phone].  DIET:  We do recommend a small meal at first,  but then you may proceed to your regular diet.  Drink plenty of fluids but you should avoid alcoholic beverages for 24 hours.  ACTIVITY:  You should plan to take it easy for the rest of today and you should NOT DRIVE or use heavy machinery until tomorrow (because of the sedation medicines used during the test).    FOLLOW UP: Our staff will call the number listed on your records 48-72 hours following your procedure to check on you and address any questions or concerns that you may have regarding the information given to you following your procedure. If we do not reach you, we will leave a message.  We will attempt to reach you two times.  During this call, we will ask if you have developed any symptoms of COVID 19. If you develop any symptoms (ie: fever, flu-like symptoms, shortness of breath, cough etc.) before then, please call [phone].  If you test positive for Covid 19 in the 2 weeks post procedure, please call and report this information to [place].    If any biopsies were taken you will be contacted by phone or by letter within the next 1-3 weeks.  Please call us at [phone] if you have not heard about the biopsies in 3 weeks.    SIGNATURES/CONFIDENTIALITY: You and/or your care partner have signed paperwork which will be entered into your electronic medical record.  These signatures attest to the fact that that the information above on your After  Visit Summary has been reviewed and is understood.  Full responsibility of the confidentiality of this discharge information lies with you and/or your care-partner. 

## 2018-12-26 NOTE — Progress Notes (Signed)
To PACU, VSS. Report to [email or web address] 

## 2018-12-29 ENCOUNTER — Other Ambulatory Visit: Payer: Self-pay | Admitting: Neurology

## 2018-12-29 DIAGNOSIS — G63 Polyneuropathy in diseases classified elsewhere: Secondary | ICD-10-CM

## 2018-12-29 DIAGNOSIS — R252 Cramp and spasm: Secondary | ICD-10-CM

## 2018-12-29 DIAGNOSIS — B20 Human immunodeficiency virus [HIV] disease: Secondary | ICD-10-CM

## 2018-12-29 DIAGNOSIS — G250 Essential tremor: Secondary | ICD-10-CM

## 2018-12-29 DIAGNOSIS — G44229 Chronic tension-type headache, not intractable: Secondary | ICD-10-CM

## 2018-12-30 ENCOUNTER — Telehealth: Payer: Self-pay

## 2018-12-30 NOTE — Telephone Encounter (Signed)
  Follow up Call-  Call back number 12/26/2018 12/19/2018  Post procedure Call Back phone  # [phone]  Permission to leave phone message Yes Yes  Some recent data might be hidden     Patient questions:  Do you have a fever, pain , or abdominal swelling? No. Pain Score  0 *  Have you tolerated food without any problems? Yes.    Have you been able to return to your normal activities? Yes.    Do you have any questions about your discharge instructions: Diet   No. Medications  No. Follow up visit  No.  Do you have questions or concerns about your Care? No.  Actions: * If pain score is 4 or above: No action needed, pain <4.  1. Have you developed a fever since your procedure? no  2.   Have you had an respiratory symptoms (SOB or cough) since your procedure? no  3.   Have you tested positive for COVID 19 since your procedure no  4.   Have you had any family members/close contacts diagnosed with the COVID 19 since your procedure?  no   If yes to any of these questions please route to Joylene [name], RN and Alphonsa Gin, Therapist, sports.

## 2019-01-01 ENCOUNTER — Encounter: Payer: Self-pay | Admitting: Internal Medicine

## 2019-01-03 ENCOUNTER — Other Ambulatory Visit: Payer: Self-pay | Admitting: Infectious Diseases

## 2019-01-03 DIAGNOSIS — B20 Human immunodeficiency virus [HIV] disease: Secondary | ICD-10-CM

## 2019-01-03 DIAGNOSIS — J449 Chronic obstructive pulmonary disease, unspecified: Secondary | ICD-10-CM

## 2019-01-06 ENCOUNTER — Telehealth: Payer: Self-pay | Admitting: *Deleted

## 2019-01-06 ENCOUNTER — Other Ambulatory Visit: Payer: Self-pay

## 2019-01-06 DIAGNOSIS — J302 Other seasonal allergic rhinitis: Secondary | ICD-10-CM

## 2019-01-06 DIAGNOSIS — B20 Human immunodeficiency virus [HIV] disease: Secondary | ICD-10-CM

## 2019-01-06 DIAGNOSIS — G609 Hereditary and idiopathic neuropathy, unspecified: Secondary | ICD-10-CM

## 2019-01-06 MED ORDER — DARUNAVIR ETHANOLATE 800 MG PO TABS: ORAL_TABLET | ORAL | 0 refills | Status: DC

## 2019-01-06 MED ORDER — MONTELUKAST SODIUM 10 MG PO TABS: ORAL_TABLET | ORAL | 0 refills | Status: DC

## 2019-01-06 MED ORDER — RITONAVIR 100 MG PO TABS: 100.0000 mg | ORAL_TABLET | Freq: Every day | ORAL | 0 refills | Status: DC

## 2019-01-06 MED ORDER — EMTRICITABINE-TENOFOVIR DF 200-300 MG PO TABS: 1.0000 | ORAL_TABLET | Freq: Every day | ORAL | 0 refills | Status: DC

## 2019-01-06 MED ORDER — ISENTRESS 400 MG PO TABS: ORAL_TABLET | ORAL | 0 refills | Status: DC

## 2019-01-06 NOTE — Telephone Encounter (Signed)
RN called patient to update him that multiple prior authorization requests for name brand norvir were unsuccessful.  His insurance company is requesting he use generic ritonavir.  Patient stated he would call his insurance company himself. Casey Gandy, RN

## 2019-01-08 ENCOUNTER — Encounter: Payer: Self-pay | Admitting: Family

## 2019-01-08 ENCOUNTER — Other Ambulatory Visit: Payer: Self-pay

## 2019-01-08 ENCOUNTER — Ambulatory Visit (INDEPENDENT_AMBULATORY_CARE_PROVIDER_SITE_OTHER): Payer: Medicare Other | Admitting: Family

## 2019-01-08 DIAGNOSIS — Z0000 Encounter for general adult medical examination without abnormal findings: Secondary | ICD-10-CM

## 2019-01-08 DIAGNOSIS — B20 Human immunodeficiency virus [HIV] disease: Secondary | ICD-10-CM

## 2019-01-08 DIAGNOSIS — I10 Essential (primary) hypertension: Secondary | ICD-10-CM | POA: Diagnosis not present

## 2019-01-08 DIAGNOSIS — G609 Hereditary and idiopathic neuropathy, unspecified: Secondary | ICD-10-CM

## 2019-01-08 DIAGNOSIS — J449 Chronic obstructive pulmonary disease, unspecified: Secondary | ICD-10-CM

## 2019-01-08 MED ORDER — EMTRICITABINE-TENOFOVIR DF 200-300 MG PO TABS: 1.0000 | ORAL_TABLET | Freq: Every day | ORAL | 0 refills | Status: DC

## 2019-01-08 MED ORDER — ISENTRESS 400 MG PO TABS: ORAL_TABLET | ORAL | 0 refills | Status: DC

## 2019-01-08 NOTE — Assessment & Plan Note (Signed)
   Declines vaccinations today.  Due for Prevnar and second dose of Menveo if before age 58 is complete.  Discussed importance of safe sexual practice to reduce risk of acquisition/transmission of STI.  Declines condoms today.  Colon cancer screening up-to-date per recommendations.  Has dentures with no additional need for dental cleaning.

## 2019-01-08 NOTE — Assessment & Plan Note (Signed)
Blood pressure well controlled.  Continue to monitor

## 2019-01-08 NOTE — Assessment & Plan Note (Addendum)
Mr. Detter has well-controlled HIV disease with good adherence and tolerance to his ART regimen of Isentress, Truvada, and Norvir.  Based on chart review he has no previous history of significant resistance.  Will discontinue darunavir/Norvir and continue current dose of Truvada and Isentress.  Discussed possibly switching to a simpler regimen of 1 pill like Biktarvy, however he declines today.  He has no new signs/symptoms of opportunistic infection or progressive HIV disease with neuropathy stable at present.  Plan for recheck of labs in 1 month or sooner if needed and office visit in 6 months or sooner if needed.

## 2019-01-08 NOTE — Progress Notes (Signed)
Subjective:    Patient ID: Casey Brennan, male    DOB: 11-May-1961, 58 y.o.   MRN: [id number]  Chief Complaint  Patient presents with  . HIV Positive/AIDS     HPI:  Casey Brennan is a 58 y.o. male with HIV disease complicated with neuropathy last seen in the office on 04/24/2018 for routine follow-up with good adherence and tolerance to his ART regimen of Isentress, Norvir, Truvada, and darunavir.  Blood work at the time showed a viral load that was undetectable with CD4 count of 680.  Most recent blood work completed on 12/04/2018 with CD4 count of 525 and viral load that remains undetectable.  Renal function, liver function, electrolytes within normal ranges.  Glucose mildly elevated at 103.  Colonoscopy up-to-date until 2030 and due for second dose of Menveo as well as Prevnar.  Mr. Beneke continues to take his Isentress, Truvada, and Norvir as prescribed.  He stopped taking the Prezista/darunavir about 4 weeks ago when he was no longer receiving the brand-name medication.  He has no adverse side effects of medication but questions the change in medication.  Overall he feels well today with no concerns. Denies fevers, chills, night sweats, headaches, changes in vision, neck pain/stiffness, nausea, diarrhea, vomiting, lesions or rashes.  Mr. Biel remains covered by [place] [facility] and has no problems obtaining his medications from the pharmacy.  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  Denies feelings of being down, depressed, or hopeless.  No recreational or illicit drug use, tobacco use, or alcohol consumption.  Not currently sexually active.   Allergies  Allergen Reactions  . Chocolate Shortness Of Breath and Other (See Comments)    Asthma attack, welts  . Descovy [Emtricitabine-Tenofovir Af] Shortness Of Breath    Nasuea, vomiiting, diarrhea, sob, whelps, throat closes  . Genvoya [Elviteg-Cobic-Emtricit-Tenofaf] Shortness Of  Breath    Nausea, vomitting, diarreha, sob, rash, throat closing  . Morphine And Related Shortness Of Breath, Itching and Other (See Comments)     chest pain  . Penicillins Shortness Of Breath and Rash    Has patient had a PCN reaction causing immediate rash, facial/tongue/throat swelling, SOB or lightheadedness with hypotension: Yes Has patient had a PCN reaction causing severe rash involving mucus membranes or skin necrosis: No Has patient had a PCN reaction that required hospitalization No Has patient had a PCN reaction occurring within the last 10 years: Yes If all of the above answers are "NO", then may proceed with Cephalosporin use.  . Sulfa Antibiotics Anaphylaxis  . Tomato Shortness Of Breath  . Vancomycin Shortness Of Breath  . Flagyl [Metronidazole] Itching  . Toradol [Ketorolac Tromethamine] Nausea And Vomiting    itching  . Tramadol Itching and Nausea And Vomiting      Outpatient Medications Prior to Visit  Medication Sig Dispense Refill  . albuterol (VENTOLIN HFA) 108 (90 Base) MCG/ACT inhaler INHALE 2 PUFFS INTO THE LUNGS EVERY 6 HOURS AS NEEDED FOR WHEEZING OR SHORTNESS OF BREATH 36 g 3  . ALPRAZolam (XANAX) 0.5 MG tablet Take 1 tablet at bedtime 30 tablet 0  . aspirin EC 81 MG tablet Take 81 mg by mouth every Monday, Wednesday, and Friday.     . darunavir (PREZISTA) 800 MG tablet TAKE 1 TABLET(800 MG) BY MOUTH DAILY 30 tablet 0  . diclofenac sodium (VOLTAREN) 1 % GEL APPLY 2 GRAMS EXTERNALLY TO THE AFFECTED AREA TWICE DAILY AS NEEDED FOR PAIN  100 g 2  . gabapentin (NEURONTIN) 300 MG capsule Take 1 capsule (300 mg total) by mouth 4 (four) times daily as needed (pain). 120 capsule 11  . hydrOXYzine (ATARAX/VISTARIL) 25 MG tablet TAKE 1 TABLET(25 MG) BY MOUTH EVERY NIGHT 90 tablet 3  . ipratropium (ATROVENT) 0.02 % nebulizer solution USE 2.5 ML(0.5 MG) VIA NEBULIZER THREE TIMES DAILY AS NEEDED FOR WHEEZING OR SHORTNESS OF BREATH (Patient taking differently: Take 0.5 mg by  nebulization 3 (three) times daily as needed for wheezing or shortness of breath. ) 1650 mL 1  . l-methylfolate-B6-B12 (METANX) 3-35-2 MG TABS tablet Take 1 tablet by mouth daily. 90 tablet 0  . meclizine (ANTIVERT) 25 MG tablet Take 1 tablet by mouth 2 (two) times daily.    . meloxicam (MOBIC) 15 MG tablet Take 1 tablet by mouth daily.    . metoprolol succinate (TOPROL-XL) 25 MG 24 hr tablet Take by mouth.    . mometasone-formoterol (DULERA) 200-5 MCG/ACT AERO Take 2 puffs first thing in am and then another 2 puffs about 12 hours later. 1 Inhaler 0  . montelukast (SINGULAIR) 10 MG tablet TAKE 1 TABLET(10 MG) BY MOUTH AT BEDTIME 90 tablet 0  . Multiple Vitamin (MULTIVITAMIN WITH MINERALS) TABS tablet Take 1 tablet by mouth daily after breakfast.     . mupirocin ointment (BACTROBAN) 2 % APPLY EXTERNALLY TO THE AFFECTED AREA TWICE DAILY FOR 3 WEEKS, PLACE COTTON BETWEEN TOES TO ALLOW BETTER AERATION AS NEEDED 66 g 0  . naproxen (NAPROSYN) 500 MG tablet Take 1 tablet by mouth 2 (two) times daily.    [name] omeprazole (PRILOSEC) 40 MG capsule TAKE 1 CAPSULE(40 MG) BY MOUTH EVERY MORNING 30 capsule 1  . ondansetron (ZOFRAN ODT) 8 MG disintegrating tablet Take 1 tablet (8 mg total) by mouth every 8 (eight) hours as needed for nausea or vomiting. 60 tablet 3  . Polyethyl Glycol-Propyl Glycol (SYSTANE) 0.4-0.3 % GEL ophthalmic gel Place 1 application into both eyes 2 (two) times daily.     . SUMAtriptan (IMITREX) 100 MG tablet TAKE 1 TABLET BY MOUTH EVERY 2 HOURS AS NEEDED FOR MIGRAINE, MAY REPEAT IN 2 HOURS IF HEADACHE PERSISTS OR RECURS 10 tablet 2  . tizanidine (ZANAFLEX) 6 MG capsule Take 1 capsule (6 mg total) by mouth 3 (three) times daily. 90 capsule 11  . torsemide (DEMADEX) 20 MG tablet Take 20 mg by mouth daily.    [name] zolpidem (AMBIEN) 5 MG tablet Take 1-2 tablets (5-10 mg total) by mouth at bedtime as needed for sleep. 30 tablet 2  . emtricitabine-tenofovir (TRUVADA) 200-300 MG tablet Take 1 tablet by  mouth daily. 30 tablet 0  . ISENTRESS 400 MG tablet TAKE 2 TABLETS(800 MG) BY MOUTH AT BEDTIME 60 tablet 0  . ritonavir (NORVIR) 100 MG TABS tablet Take 1 tablet (100 mg total) by mouth at bedtime. 30 tablet 0   No facility-administered medications prior to visit.      Past Medical History:  Diagnosis Date  . Arthritis    r knee   . Asthma    very rare  . Axillary lymphadenopathy   . Bronchitis   . Chronic anemia    normocytic  . Chronic folliculitis   . Elevated PSA   . Environmental and seasonal allergies   . GERD (gastroesophageal reflux disease)    Patient denies  . Gross hematuria   . H/O pericarditis    2010--  myopercarditis--  resolved  . HCAP (healthcare-associated pneumonia) 11/19/2014  .  Headache(784.0)    HX SEVERE FRONTAL HA'S  . Hiatal hernia   . History of concussion    2012  &  2013  RESIDUAL HA'S --  RESOLVED  . History of gastric ulcer   . History of kidney stones   . History of MRSA infection 2010   infected boil  . HIV (human immunodeficiency virus infection) ([place]) 1988  . Hypertension   . Internal hemorrhoids   . Lytic bone lesion of hip    WORK-UP DONE BY ONCOLOGIST DR HA --  NOT MALIGNANT  . Pneumonia    hx of  . Post concussion syndrome    resolved  . Prostate cancer ([place]) 04/25/13   gleason 3+3=6, volume 30 gm  . Sinusitis, chronic 05/14/2015  . Ulcer    hx of gastric  . Wears glasses      Past Surgical History:  Procedure Laterality Date  . CARDIAC CATHETERIZATION  03-23-2009  DR COOPER   NORMAL CORONARY ARTERIES  . CHOLECYSTECTOMY N/A 11/06/2014   Procedure: LAPAROSCOPIC CHOLECYSTECTOMY WITH INTRAOPERATIVE CHOLANGIOGRAM;  Surgeon: Judeth Horn, MD;  Location: [facility];  Service: General;  Laterality: N/A;  . COLONOSCOPY  12/26/2011   Procedure: COLONOSCOPY;  Surgeon: Lear Ng, MD;  Location: [facility];  Service: Endoscopy;  Laterality: N/A;  . COLONOSCOPY    . COLONOSCOPY WITH PROPOFOL N/A 07/29/2014   Procedure:  COLONOSCOPY WITH PROPOFOL;  Surgeon: Lear Ng, MD;  Location: [facility];  Service: Endoscopy;  Laterality: N/A;  . CYSTOSCOPY N/A 05/18/2018   Procedure: CYSTOSCOPY REMOVAL FOREIGN BODY;  Surgeon: Kathie Rhodes, MD;  Location: [facility];  Service: Urology;  Laterality: N/A;  . CYSTOSCOPY/RETROGRADE/URETEROSCOPY Bilateral 04/25/2013   Procedure: CYSTOSCOPY/ BILATERAL RETROGRADES; BLADDER BIOPSIES;  Surgeon: Alexis Frock, MD;  Location: [facility];  Service: Urology;  Laterality: Bilateral;  . DENTAL EXAMINATION UNDER ANESTHESIA    . ESOPHAGOGASTRODUODENOSCOPY  12/26/2011   Procedure: ESOPHAGOGASTRODUODENOSCOPY (EGD);  Surgeon: Lear Ng, MD;  Location: [facility];  Service: Endoscopy;  Laterality: N/A;  . ESOPHAGOGASTRODUODENOSCOPY (EGD) WITH PROPOFOL N/A 07/29/2014   Procedure: ESOPHAGOGASTRODUODENOSCOPY (EGD) WITH PROPOFOL;  Surgeon: Lear Ng, MD;  Location: [facility];  Service: Endoscopy;  Laterality: N/A;  . EXCISION CHRONIC LEFT BREAST ABSCESS  09-21-2010  . EXCISIONAL BX LEFT BREAST MASS/  I  &  D LEFT BREAST ABSCESS  03-24-2009  . KNEE ARTHROSCOPY Right 1985  . left axilla biopsy  04/2013  . left hip biopsy  10/2012  . LYMPHADENECTOMY Bilateral 08/18/2013   Procedure: LYMPHADENECTOMY "PELVIC LYMPH NODE DISSECTION";  Surgeon: Alexis Frock, MD;  Location: [facility];  Service: Urology;  Laterality: Bilateral;  . PROSTATE BIOPSY N/A 04/25/2013   Procedure: BIOPSY TRANSRECTAL ULTRASONIC PROSTATE (TUBP);  Surgeon: Alexis Frock, MD;  Location: [facility];  Service: Urology;  Laterality: N/A;  . ROBOT ASSISTED LAPAROSCOPIC RADICAL PROSTATECTOMY N/A 08/18/2013   Procedure: ROBOTIC ASSISTED LAPAROSCOPIC RADICAL PROSTATECTOMY;  Surgeon: Alexis Frock, MD;  Location: [facility];  Service: Urology;  Laterality: N/A;  . TOTAL KNEE ARTHROPLASTY Right 08/01/2016   08/17/16  . TOTAL KNEE ARTHROPLASTY Right 08/01/2016   Procedure: TOTAL KNEE  ARTHROPLASTY;  Surgeon: Renette Butters, MD;  Location: [facility];  Service: Orthopedics;  Laterality: Right;  . UPPER GASTROINTESTINAL ENDOSCOPY    . UPPER LEG SOFT TISSUE BIOPSY Left 2012   lthigh       Review of Systems  Constitutional: Negative for appetite change, chills, fatigue, fever and unexpected weight change.  Eyes:  Negative for visual disturbance.  Respiratory: Negative for cough, chest tightness, shortness of breath and wheezing.   Cardiovascular: Negative for chest pain and leg swelling.  Gastrointestinal: Negative for abdominal pain, constipation, diarrhea, nausea and vomiting.  Genitourinary: Negative for dysuria, flank pain, frequency, genital sores, hematuria and urgency.  Skin: Negative for rash.  Allergic/Immunologic: Negative for immunocompromised state.  Neurological: Negative for dizziness and headaches.      Objective:    BP 125/88   Pulse 79   Temp 98.5 F (36.9 C)   Wt 226 lb (102.5 kg)   BMI 33.37 kg/m  Nursing note and vital signs reviewed.  Physical Exam Constitutional:      General: He is not in acute distress.    Appearance: He is well-developed.  Eyes:     Conjunctiva/sclera: Conjunctivae normal.  Neck:     Musculoskeletal: Neck supple.  Cardiovascular:     Rate and Rhythm: Normal rate and regular rhythm.     Heart sounds: Normal heart sounds. No murmur. No friction rub. No gallop.   Pulmonary:     Effort: Pulmonary effort is normal. No respiratory distress.     Breath sounds: Normal breath sounds. No wheezing or rales.  Chest:     Chest wall: No tenderness.  Abdominal:     General: Bowel sounds are normal.     Palpations: Abdomen is soft.     Tenderness: There is no abdominal tenderness.  Lymphadenopathy:     Cervical: No cervical adenopathy.  Skin:    General: Skin is warm and dry.     Findings: No rash.  Neurological:     Mental Status: He is alert and oriented to person, place, and time.  Psychiatric:        Behavior:  Behavior normal.        Thought Content: Thought content normal.        Judgment: Judgment normal.        Assessment & Plan:   Problem List Items Addressed This Visit      Cardiovascular and Mediastinum   HTN (hypertension)    Blood pressure well controlled.  Continue to monitor        Respiratory   COPD GOLD III    Stable with no evidence of exacerbation.  Continue to monitor.        Nervous and Auditory   Hereditary and idiopathic peripheral neuropathy   Relevant Medications   emtricitabine-tenofovir (TRUVADA) 200-300 MG tablet     Other   Human immunodeficiency virus (HIV) disease ([place])    Mr. Sarafian has well-controlled HIV disease with good adherence and tolerance to his ART regimen of Isentress, Truvada, and Norvir.  Based on chart review he has no previous history of significant resistance.  Will discontinue darunavir/Norvir and continue current dose of Truvada and Isentress.  Discussed possibly switching to a simpler regimen of 1 pill like Biktarvy, however he declines today.  He has no new signs/symptoms of opportunistic infection or progressive HIV disease with neuropathy stable at present.  Plan for recheck of labs in 1 month or sooner if needed and office visit in 6 months or sooner if needed.      Relevant Medications   emtricitabine-tenofovir (TRUVADA) 200-300 MG tablet   ISENTRESS 400 MG tablet   Other Relevant Orders   T-helper cell (CD4)- ([facility] clinic only)   HIV-1 RNA quant-no reflex-bld   Comprehensive metabolic panel   RPR   CBC   HIV-1 RNA quant-no reflex-bld  Healthcare maintenance     Declines vaccinations today.  Due for Prevnar and second dose of Menveo if before age 89 is complete.  Discussed importance of safe sexual practice to reduce risk of acquisition/transmission of STI.  Declines condoms today.  Colon cancer screening up-to-date per recommendations.  Has dentures with no additional need for dental cleaning.       Other Visit  Diagnoses    Human immunodeficiency virus (HIV) disease ([place])       Relevant Medications   emtricitabine-tenofovir (TRUVADA) 200-300 MG tablet   ISENTRESS 400 MG tablet       I have discontinued Jeneen Rinks A. Jablon "Tony"'s ritonavir. I am also having him maintain his aspirin EC, multivitamin with minerals, Polyethyl Glycol-Propyl Glycol, ipratropium, zolpidem, ondansetron, omeprazole, metoprolol succinate, torsemide, mupirocin ointment, l-methylfolate-B6-B12, diclofenac sodium, naproxen, meloxicam, meclizine, mometasone-formoterol, SUMAtriptan, ALPRAZolam, gabapentin, tizanidine, hydrOXYzine, albuterol, darunavir, montelukast, emtricitabine-tenofovir, and Isentress.   Meds ordered this encounter  Medications  . emtricitabine-tenofovir (TRUVADA) 200-300 MG tablet    Sig: Take 1 tablet by mouth daily.    Dispense:  30 tablet    Refill:  0    Order Specific Question:   Supervising Provider    Answer:   Carlyle Basques [[id number]]  . ISENTRESS 400 MG tablet    Sig: TAKE 2 TABLETS(800 MG) BY MOUTH AT BEDTIME    Dispense:  60 tablet    Refill:  0    Order Specific Question:   Supervising Provider    Answer:   Carlyle Basques [[id number]]     Follow-up: Return in about 6 months (around 07/11/2019), or if symptoms worsen or fail to improve.   Terri Piedra, MSN, FNP-C Nurse Practitioner [facility] for Infectious Disease [place] number: [phone]

## 2019-01-08 NOTE — Assessment & Plan Note (Signed)
Stable with no evidence of exacerbation.  Continue to monitor.

## 2019-01-08 NOTE — Patient Instructions (Addendum)
Nice to see you.  Please continue to take your Isentress and Truvada.   You can stop taking the Norvir/Prezista.  We will recheck your blood work in 1 month or when able.  Refills of medication have been sent to the pharmacy.   Plan for follow up in 6 months or sooner if needed with lab work 1-2 weeks prior to appointment.  Safe travels and have a great day!

## 2019-01-16 ENCOUNTER — Other Ambulatory Visit: Payer: Self-pay | Admitting: *Deleted

## 2019-01-16 DIAGNOSIS — G4700 Insomnia, unspecified: Secondary | ICD-10-CM

## 2019-01-16 MED ORDER — ALPRAZOLAM 0.5 MG PO TABS: ORAL_TABLET | ORAL | 0 refills | Status: DC

## 2019-01-16 NOTE — Telephone Encounter (Signed)
Received refill request for xanax 0.5mg  one tab qhs insomnia #30.  Last fill check in drug registry 11-16-18 #30.  Pharmacy did not receive fax for 12-04-18 prescription.

## 2019-01-23 ENCOUNTER — Other Ambulatory Visit: Payer: Self-pay

## 2019-01-23 DIAGNOSIS — B20 Human immunodeficiency virus [HIV] disease: Secondary | ICD-10-CM

## 2019-01-23 MED ORDER — MUPIROCIN 2 % EX OINT: TOPICAL_OINTMENT | CUTANEOUS | 4 refills | Status: DC

## 2019-02-05 MED ORDER — OMEPRAZOLE 40 MG PO CPDR: DELAYED_RELEASE_CAPSULE | ORAL | 3 refills | Status: DC

## 2019-02-11 ENCOUNTER — Other Ambulatory Visit: Payer: Medicare Other

## 2019-02-17 ENCOUNTER — Other Ambulatory Visit: Payer: Self-pay | Admitting: Infectious Diseases

## 2019-02-17 DIAGNOSIS — J302 Other seasonal allergic rhinitis: Secondary | ICD-10-CM

## 2019-03-03 ENCOUNTER — Other Ambulatory Visit: Payer: Self-pay | Admitting: Infectious Diseases

## 2019-03-03 DIAGNOSIS — G43009 Migraine without aura, not intractable, without status migrainosus: Secondary | ICD-10-CM

## 2019-03-04 ENCOUNTER — Other Ambulatory Visit: Payer: Self-pay

## 2019-03-27 ENCOUNTER — Other Ambulatory Visit: Payer: Self-pay | Admitting: Cardiovascular Disease

## 2019-03-27 ENCOUNTER — Ambulatory Visit
Admission: RE | Admit: 2019-03-27 | Discharge: 2019-03-27 | Disposition: A | Discharge status: Home / Self Care | Payer: Medicare Other | Source: Ambulatory Visit | Attending: Cardiovascular Disease | Admitting: Cardiovascular Disease

## 2019-03-27 DIAGNOSIS — W010XXA Fall on same level from slipping, tripping and stumbling without subsequent striking against object, initial encounter: Secondary | ICD-10-CM

## 2019-03-27 DIAGNOSIS — M542 Cervicalgia: Secondary | ICD-10-CM

## 2019-04-09 ENCOUNTER — Other Ambulatory Visit: Payer: Self-pay | Admitting: Neurology

## 2019-04-09 DIAGNOSIS — G4700 Insomnia, unspecified: Secondary | ICD-10-CM

## 2019-04-23 ENCOUNTER — Telehealth: Payer: Self-pay | Admitting: Family Medicine

## 2019-04-23 ENCOUNTER — Encounter: Payer: Self-pay | Admitting: Family Medicine

## 2019-04-23 ENCOUNTER — Other Ambulatory Visit: Payer: Self-pay

## 2019-04-23 ENCOUNTER — Ambulatory Visit (INDEPENDENT_AMBULATORY_CARE_PROVIDER_SITE_OTHER): Payer: Medicare Other | Admitting: Family Medicine

## 2019-04-23 ENCOUNTER — Ambulatory Visit
Admission: RE | Admit: 2019-04-23 | Discharge: 2019-04-23 | Disposition: A | Discharge status: Home / Self Care | Payer: Medicare Other | Source: Ambulatory Visit | Attending: Family Medicine | Admitting: Family Medicine

## 2019-04-23 VITALS — BP 133/85 | HR 56 | Temp 97.5°F | Ht 69.0 in | Wt 225.8 lb

## 2019-04-23 DIAGNOSIS — G44319 Acute post-traumatic headache, not intractable: Secondary | ICD-10-CM

## 2019-04-23 DIAGNOSIS — B20 Human immunodeficiency virus [HIV] disease: Secondary | ICD-10-CM | POA: Diagnosis not present

## 2019-04-23 DIAGNOSIS — G63 Polyneuropathy in diseases classified elsewhere: Secondary | ICD-10-CM

## 2019-04-23 DIAGNOSIS — G4700 Insomnia, unspecified: Secondary | ICD-10-CM

## 2019-04-23 DIAGNOSIS — G609 Hereditary and idiopathic neuropathy, unspecified: Secondary | ICD-10-CM | POA: Diagnosis not present

## 2019-04-23 DIAGNOSIS — G43009 Migraine without aura, not intractable, without status migrainosus: Secondary | ICD-10-CM

## 2019-04-23 NOTE — Patient Instructions (Addendum)
We will order a CT head to evaluate for any acute intracranial changes  Please take steroid dose pack as prescribed by ortho  Follow up with Dr Casey Brennan next week for chronic neck pain  Follow up with [place] for worsening or unresolved symptoms.    Head Injury, Adult There are many types of head injuries. They can be as minor as a bump. Some head injuries can be worse. Worse injuries include:  A strong hit to the head that shakes the brain back and forth causing damage (concussion).  A bruise (contusion) of the brain. This means there is bleeding in the brain that can cause swelling.  A cracked skull (skull fracture).  Bleeding in the brain that gathers, gets thick (makes a clot), and forms a bump (hematoma). Most problems from a head injury come in the first 24 hours. However, you may still have side effects up to 7-10 days after your injury. It is important to watch your condition for any changes. You may need to be watched in the emergency department or urgent care, or you may need to stay in the hospital. What are the causes? There are many possible causes of a head injury. A serious head injury may be caused by:  A car accident.  Bicycle or motorcycle accidents.  Sports injuries.  Falls. What are the signs or symptoms? Symptoms of a head injury include a bruise, bump, or bleeding where the injury happened. Other physical symptoms may include:  Headache.  Feeling sick to your stomach (nauseous) or vomiting.  Dizziness.  Feeling tired.  Being uncomfortable around bright lights or loud noises.  Shaking movements that you cannot control (seizures).  Trouble being woken up.  Passing out (fainting). Mental or emotional symptoms may include:  Feeling grumpy or cranky.  Confusion and memory problems.  Having trouble paying attention or concentrating.  Changes in eating or sleeping habits.  Feeling worried or nervous (anxious).  Feeling sad (depressed). How is  this treated? Treatment for this condition depends on how severe the injury is and the type of injury you have. The main goal is to prevent complications and to allow the brain time to heal. Mild head injury If you have a mild head injury, you may be sent home and treatment may include:  Being watched. A responsible adult should stay with you for 24 hours after your injury and check on you often.  Physical rest.  Brain rest.  Pain medicines. Severe head injury If you have a severe head injury, treatment may include:  Being watched closely. This includes hospitalization with frequent physical exams.  Medicines to: ? Help with pain. ? Prevent shaking movements that you cannot control. ? Help with brain swelling.  Using a machine that helps you breathe (ventilator).  Treatments to manage the swelling inside the brain.  Brain surgery. This may be needed to: ? Remove a blood clot. ? Stop the bleeding. ? Remove a part of the skull. This allows room for the brain to swell. Follow these instructions at home: Activity  Rest.  Avoid activities that are hard or tiring.  Make sure you get enough sleep.  Limit activities that need a lot of thought or attention, such as: ? Watching TV. ? Playing memory games and puzzles. ? Job-related work or homework. ? Working on [employer], and texting.  Avoid activities that could cause another head injury until your doctor says it is okay. This includes playing sports. Having another head injury, especially  before the first one has healed, can be dangerous.  Ask your doctor when it is safe for you to go back to your normal activities, such as work or school. Ask your doctor for a step-by-step plan for slowly going back to your normal activities.  Ask your doctor when you can drive, ride a bicycle, or use heavy machinery. Do not do these activities if you are dizzy. Lifestyle   Do not drink alcohol until your doctor says it  is okay.  Do not use drugs.  If it is harder than usual to remember things, write them down.  If you are easily distracted, try to do one thing at a time.  Talk with family members or close friends when making important decisions.  Tell your friends, family, a trusted coworker, and work Freight forwarder about your injury, symptoms, and limits (restrictions). Have them watch for any problems that are new or getting worse. General instructions  Take over-the-counter and prescription medicines only as told by your doctor.  Have someone stay with you for 24 hours after your head injury. This person should watch you for any changes in your symptoms and be ready to get help.  Keep all follow-up visits as told by your doctor. This is important. How is this prevented?  Work on [employer]. This can help you avoid falls.  Wear a seatbelt when you are in a moving vehicle.  Wear a helmet when you: ? Ride a bicycle. ? Ski. ? Do any other sport or activity that has a risk of injury.  If you drink alcohol: ? Limit how much you use to: ? 0-1 drink a day for women. ? 0-2 drinks a day for men. ? Be aware of how much alcohol is in your drink. In the [place] one drink equals one 12 oz bottle of beer (355 mL), one 5 oz glass of wine (148 mL), or one 1 oz glass of hard liquor (44 mL).  Make your home safer by: ? Getting rid of clutter from the floors and stairs. This includes things that can make you trip. ? Using grab bars in bathrooms and handrails by stairs. ? Placing non-slip mats on floors and in bathtubs. ? Putting more light in dim areas. Get help right away if:  You have: ? A very bad headache that is not helped by medicine. ? Trouble walking or weakness in your arms and legs. ? Clear or bloody fluid coming from your nose or ears. ? Changes in how you see (vision). ? Shaking movements that you cannot control.  You lose your balance.  You vomit.  The black centers of your  eyes (pupils) change in size.  Your speech is slurred.  Your dizziness gets worse.  You pass out.  You are sleepier than normal and have trouble staying awake.  Your symptoms get worse. These symptoms may be an emergency. Do not wait to see if the symptoms will go away. Get medical help right away. Call your local emergency services (911 in the [place] Do not drive yourself to the hospital. Summary  There are many types of head injuries. They can be as minor as a bump. Some head injuries can be worse  Treatment for this condition depends on how severe the injury is and the type of injury you have.  Ask your doctor when it is safe for you to go back to your normal activities, such as work or school.  To prevent a head injury,  wear a seat belt in a car, wear a helmet when you use a a bicycle, limit your alcohol use, and make your home safer. This information is not intended to replace advice given to you by your health care provider. Make sure you discuss any questions you have with your health care provider. Document Released: 06/01/2008 Document Revised: 10/10/2018 Document Reviewed: 07/12/2018 Elsevier Patient Education  [place], Adult  A concussion is a brain injury from a hard, direct hit (trauma) to your head or body. This direct hit causes the brain to quickly shake back and forth inside the skull. A concussion may also be called a mild traumatic brain injury (TBI). Healing from this injury can take time. What are the causes? This condition is caused by:  A direct hit to your head, such as: ? Running into a player during a game. ? Being hit in a fight. ? Hitting your head on a hard surface.  A quick and sudden movement (jolt) of the head or neck, such as in a car crash. What are the signs or symptoms? The signs of a concussion can be hard to notice. They may be missed by you, family members, and doctors. You may look fine on the outside but may not  act or feel normal. Physical symptoms  Headaches.  Being tired (fatigued).  Being dizzy.  Problems with body balance.  Problems seeing or hearing.  Being sensitive to light or noise.  Feeling sick to your stomach (nausea) or throwing up (vomiting).  Not sleeping or eating as you used to.  Loss of feeling (numbness) or tingling in the body.  Seizure. Mental and emotional symptoms  Problems remembering things.  Trouble focusing your mind (concentrating), organizing, or making decisions.  Being slow to think, act, react, speak, or read.  Feeling grouchy (irritable).  Having mood changes.  Feeling worried or nervous (anxious).  Feeling sad (depressed). How is this treated? This condition may be treated by:  Stopping sports or activity if you are injured. If you hit your head or have signs of concussion: ? Do not return to sports or activities the same day. ? Get checked by a doctor before you return to your activities.  Resting your body and your mind.  Being watched carefully, often at home.  Medicines to help with symptoms such as: ? Feeling sick to your stomach. ? Headaches. ? Problems with sleep.  Avoid taking strong pain medicines (opioids) for a concussion.  Avoiding alcohol and drugs.  Being asked to go to a concussion clinic or a place to help you recover (rehabilitation center). Recovery from a concussion can take time. Return to activities only:  When you are fully healed.  When your doctor says it is safe. Follow these instructions at home: Activity  Limit activities that need a lot of thought or focus, such as: ? Homework or work for your job. ? Watching TV. ? Using the computer or phone. ? Playing memory games and puzzles.  Rest. Rest helps your brain heal. Make sure you: ? Get plenty of sleep. Most adults should get 7-9 hours of sleep each night. ? Rest during the day. Take naps or breaks when you feel tired.  Avoid activity like  exercise until your doctor says its safe. Stop any activity that makes symptoms worse.  Do not do activities that could cause a second concussion, such as riding a bike or playing sports.  Ask your doctor when you can return  to your normal activities, such as school, work, sports, and driving. Your ability to react may be slower. Do not do these activities if you are dizzy. General instructions   Take over-the-counter and prescription medicines only as told by your doctor.  Do not drink alcohol until your doctor says you can.  Watch your symptoms and tell other people to do the same. Other problems can occur after a concussion. Older adults have a higher risk of serious problems.  Tell your work Freight forwarder, teachers, Government social research officer, school counselor, coach, or Product/process development scientist about your injury and symptoms. Tell them about what you can or cannot do.  Keep all follow-up visits as told by your doctor. This is important. How is this prevented?  It is very important that you do not get another brain injury. In rare cases, another injury can cause brain damage that will not go away, brain swelling, or death. The risk of this is greatest in the first 7-10 days after a head injury. To avoid injuries: ? Stop activities that could lead to a second concussion, such as contact sports, until your doctor says it is okay. ? When you return to sports or activities:  Do not crash into other players. This is how most concussions happen.  Follow the rules.  Respect other players. ? Get regular exercise. Do strength and balance training. ? Wear a helmet that fits you well during sports, biking, or other activities.  Helmets can help protect you from serious skull and brain injuries, but they do not protect you from a concussion. Even when wearing a helmet, you should avoid being hit in the head. Contact a doctor if:  Your symptoms get worse or they do not get better.  You have new symptoms.  You have  another injury. Get help right away if:  You have bad headaches or your headaches get worse.  You feel weak or numb in any part of your body.  You are mixed up (confused).  Your balance gets worse.  You keep throwing up.  You feel more sleepy than normal.  Your speech is not clear (is slurred).  You cannot recognize people or places.  You have a seizure.  Others have trouble waking you up.  You have behavior changes.  You have changes in how you see (vision).  You pass out (lose consciousness). Summary  A concussion is a brain injury from a hard, direct hit (trauma) to your head or body.  This condition is treated with rest and careful watching of symptoms.  If you keep having symptoms, call your doctor. This information is not intended to replace advice given to you by your health care provider. Make sure you discuss any questions you have with your health care provider. Document Released: 06/07/2009 Document Revised: 02/07/2018 Document Reviewed: 02/07/2018 Elsevier Patient Education  2020 [place].     Cervicogenic Headache  A cervicogenic headache is a headache caused by a condition that affects the bones and tissues in your neck (cervical spine). In a cervicogenic headache, the pain moves from your neck to your head. Most cervicogenic headaches start in the upper part of the neck with the first three cervical bones (cervical vertebrae). A cervicogenic headache is diagnosed when a cause can be found in the cervical spine and other causes of headaches can be ruled out. What are the causes? The most common cause of this condition is a traumatic injury to the cervical spine, such as whiplash. Other causes include:  Arthritis.  Broken bone (fracture).  Infection.  Tumor. What are the signs or symptoms? The most common symptoms are neck and head pain. The pain is often located on one side. In some cases, there may be head pain without neck pain. Pain may  be felt in the neck, back or side of the head, face, or behind the eyes. Other symptoms include:  Limited movement in the neck.  Arm or shoulder pain. How is this diagnosed? This condition may be diagnosed based on:  Your symptoms.  A physical exam.  An injection that blocks nerve signals (nerve block).  Imaging tests, such as: ? X-rays. ? CT scan. ? MRI. How is this treated? Treatment for this condition may depend on the underlying condition. Treatment may include:  Medicines, such as: ? NSAIDs. ? Muscle relaxants.  Physical therapy.  Massage therapy.  Complementary therapies, such as: ? Biofeedback. ? Meditation. ? Acupuncture.  Nerve block injections.  Botulinum toxin injections. Your treatment plan may involve working with a pain management team that includes your primary health care provider, a pain management specialist, a neurologist, and a physical therapist. Follow these instructions at home:  Take over-the-counter and prescription medicines only as told by your health care provider.  Do exercises at home as told by your physical therapist.  Return to your normal activities as told by your health care provider. Ask your health care provider what activities are safe for you. Avoid activities that trigger your headaches.  Maintain good neck support and posture at home and at work.  Keep all follow-up visits as told by your health care provider. This is important. Contact a health care provider if you have:  Headaches that are getting worse and happening more often.  Headaches with any of the following: ? Fever. ? Numbness. ? Weakness. ? Dizziness. ? Nausea or vomiting. Get help right away if:  You have a very sudden and severe headache. Summary  A cervicogenic headache is a headache caused by a condition that affects the bones and tissues in your cervical spine.  Your health care provider may diagnose this condition with a physical exam, a  nerve block, and imaging tests.  Treatment may include medicine to reduce pain and inflammation, physical therapy, and nerve block injections.  Complementary therapies, such as acupuncture and meditation, may be added to other treatments.  Your treatment plan may involve working with a pain management team that includes your primary health care provider, a pain management specialist, a neurologist, and a physical therapist. This information is not intended to replace advice given to you by your health care provider. Make sure you discuss any questions you have with your health care provider. Document Released: 09/09/2003 Document Revised: 10/09/2018 Document Reviewed: 06/29/2017 Elsevier Patient Education  2020 [employer].
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
## 2019-04-23 NOTE — Telephone Encounter (Signed)
[employer] medicare no auth patient is going to walk into GI

## 2019-04-24 ENCOUNTER — Telehealth: Payer: Self-pay | Admitting: *Deleted

## 2019-04-24 NOTE — Telephone Encounter (Signed)
Dr. Lannette Donath has reviewed results of CT scan.  No acute concerns noted.  Patient sister, April, was notified of these results.  I have also spoken with her regarding her concerns of previous diagnoses.  She reports that patient does not have peripheral neuropathy.  She states that this is a condition that resolved once he had ingrown toenails extracted.  I have reviewed previous notes from myself, Hoyle Sauer and Dr. Brett Fairy.  At each appointment we have addressed that patient is on gabapentin and Zanaflex for peripheral neuropathy.  He does have a history of migraines which are also treated with these medications.  Nerve conduction study with Dr. Jannifer Franklin in 2012 confirmed peripheral neuropathy.  I discussed these findings with April.  She does not feel that diagnosis of neuropathy should remain on patient's chart.  Previous diagnoses used include HIV related neuropathy as well as hereditary and idiopathic neuropathy.  I advised April that I would reach out to Dr. Brett Fairy and have her review his chart.  I will have her make a determination regarding accuracy of these diagnoses.  She verbalizes understanding and agreement with this plan.

## 2019-04-24 NOTE — Telephone Encounter (Signed)
April called asking about CT head done on pt yesterday was marked as urgent.  I did not see results.  I called and spoke to GI Medical records, she spoke to [place] and relayed that we read our CT head images on pt.  I relayed to AL,NP and she will speak to MD about findings.  April also relayed that Pt has HIV but never relayed that neuropathy was diagnosed cause or does he have a family history of Peripheral neuropathy.  She would like this taken from his medical record.

## 2019-04-25 ENCOUNTER — Other Ambulatory Visit: Payer: Self-pay

## 2019-04-25 DIAGNOSIS — B20 Human immunodeficiency virus [HIV] disease: Secondary | ICD-10-CM

## 2019-04-25 DIAGNOSIS — G609 Hereditary and idiopathic neuropathy, unspecified: Secondary | ICD-10-CM

## 2019-04-25 MED ORDER — EMTRICITABINE-TENOFOVIR DF 200-300 MG PO TABS: 1.0000 | ORAL_TABLET | Freq: Every day | ORAL | 5 refills | Status: DC

## 2019-04-25 NOTE — Telephone Encounter (Signed)
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   [facility] providers followed the patient for a condition he believes he no longer has, he DOESN'T NEED NEUROLOGICAL FOLLOW UP.  Are his migraines controlled? Then his PCP can refill those medications too.  Larey Seat, MD

## 2019-04-28 NOTE — Telephone Encounter (Signed)
Correction. Dr Leta Baptist read CT results.

## 2019-05-08 ENCOUNTER — Emergency Department (HOSPITAL_COMMUNITY)
Admission: EM | Admit: 2019-05-08 | Discharge: 2019-05-08 | Disposition: A | Discharge status: Home / Self Care | Payer: Medicare Other | Attending: Emergency Medicine | Admitting: Emergency Medicine

## 2019-05-08 ENCOUNTER — Encounter (HOSPITAL_COMMUNITY): Payer: Self-pay | Admitting: Emergency Medicine

## 2019-05-08 ENCOUNTER — Emergency Department (HOSPITAL_COMMUNITY): Payer: Medicare Other

## 2019-05-08 ENCOUNTER — Telehealth: Payer: Self-pay | Admitting: Family Medicine

## 2019-05-08 ENCOUNTER — Other Ambulatory Visit: Payer: Self-pay

## 2019-05-08 DIAGNOSIS — Z5321 Procedure and treatment not carried out due to patient leaving prior to being seen by health care provider: Secondary | ICD-10-CM | POA: Insufficient documentation

## 2019-05-08 DIAGNOSIS — R42 Dizziness and giddiness: Secondary | ICD-10-CM | POA: Insufficient documentation

## 2019-05-08 DIAGNOSIS — R0789 Other chest pain: Secondary | ICD-10-CM | POA: Diagnosis present

## 2019-05-08 DIAGNOSIS — R519 Headache, unspecified: Secondary | ICD-10-CM | POA: Insufficient documentation

## 2019-05-08 LAB — BASIC METABOLIC PANEL
Anion gap: 7 (ref 5–15)
BUN: 14 mg/dL (ref 6–20)
CO2: 26 mmol/L (ref 22–32)
Calcium: 9 mg/dL (ref 8.9–10.3)
Chloride: 104 mmol/L (ref 98–111)
Creatinine, Ser: 1.36 mg/dL — ABNORMAL HIGH (ref 0.61–1.24)
GFR calc Af Amer: >60 mL/min (ref >60)
GFR calc non Af Amer: 58 mL/min — ABNORMAL LOW (ref >60)
Glucose, Bld: 110 mg/dL — ABNORMAL HIGH (ref 70–99)
Potassium: 3.8 mmol/L (ref 3.5–5.1)
Sodium: 137 mmol/L (ref 135–145)

## 2019-05-08 LAB — CBC
HCT: 38.5 % — ABNORMAL LOW (ref 39.0–52.0)
Hemoglobin: 12.6 g/dL — ABNORMAL LOW (ref 13.0–17.0)
MCH: 32.3 pg (ref 26.0–34.0)
MCHC: 32.7 g/dL (ref 30.0–36.0)
MCV: 98.7 fL (ref 80.0–100.0)
Platelets: 164 10*3/uL (ref 150–400)
RBC: 3.9 MIL/uL — ABNORMAL LOW (ref 4.22–5.81)
RDW: 13.7 % (ref 11.5–15.5)
WBC: 7 10*3/uL (ref 4.0–10.5)
nRBC: 0 % (ref 0.0–0.2)

## 2019-05-08 LAB — TROPONIN I (HIGH SENSITIVITY): Troponin I (High Sensitivity): 3 ng/L (ref <18)

## 2019-05-08 MED ORDER — SODIUM CHLORIDE 0.9% FLUSH: 3.0000 mL | Freq: Once | INTRAVENOUS | Status: DC

## 2019-05-08 NOTE — ED Triage Notes (Signed)
Pt c/o central, non-radiating chest pain, headache and lightheadedness that started an hour PTA. Denies shortness of breath or known COVID exposures.

## 2019-05-08 NOTE — Telephone Encounter (Signed)
Pt sister(on DPR) is asking for a call to discuss pt going to the ED this morning because of bad throbbing headaches in spite of taking all prescribed medications.  Please call

## 2019-05-08 NOTE — ED Notes (Signed)
Pt states he is going to leave and go to his surgeon's office now.

## 2019-05-08 NOTE — Telephone Encounter (Signed)
Yes. He has ortho provider that is monitoring neck. Incontinence is unusual and should be worked up urgently if not typical and persistent.

## 2019-05-08 NOTE — Telephone Encounter (Signed)
I called sister back and I relayed that yes ortho MD is monitoring neck.  Incontinence not usual and if not typical and persistent to seek evaulation by pcp, she stated that he oncology for prostate cancer, would speak with them.   She appreciated call back.

## 2019-05-08 NOTE — Telephone Encounter (Signed)
I called sister of pt.  She relayed that Dr. Tobi Bastos is following him for neck (due for injection in neck under fluorscopy).  She called relating that she was concerned that pt was feeling bad, sluggish, neck pain, down to shoulder, chest.  Seen In Ed EKG, CXR, lab work done.  Has some incontinence and throbbing pain in occipital area.  She has call to Dr. Tobi Bastos.  I relayed that since he is treating for neck issues would speak with him about concerns, asking about MRI being done.  She would touch base with him.  I would relay to AL/NP as well and if any other recs would let her know.

## 2019-05-18 ENCOUNTER — Other Ambulatory Visit: Payer: Self-pay

## 2019-05-18 ENCOUNTER — Emergency Department (HOSPITAL_COMMUNITY)
Admission: EM | Admit: 2019-05-18 | Discharge: 2019-05-19 | Disposition: A | Discharge status: Home / Self Care | Payer: Medicare Other | Attending: Emergency Medicine | Admitting: Emergency Medicine

## 2019-05-18 ENCOUNTER — Encounter (HOSPITAL_COMMUNITY): Payer: Self-pay | Admitting: Emergency Medicine

## 2019-05-18 DIAGNOSIS — Z8546 Personal history of malignant neoplasm of prostate: Secondary | ICD-10-CM | POA: Insufficient documentation

## 2019-05-18 DIAGNOSIS — G43009 Migraine without aura, not intractable, without status migrainosus: Secondary | ICD-10-CM | POA: Diagnosis not present

## 2019-05-18 DIAGNOSIS — B20 Human immunodeficiency virus [HIV] disease: Secondary | ICD-10-CM | POA: Diagnosis not present

## 2019-05-18 DIAGNOSIS — Z87891 Personal history of nicotine dependence: Secondary | ICD-10-CM | POA: Insufficient documentation

## 2019-05-18 DIAGNOSIS — T7840XA Allergy, unspecified, initial encounter: Secondary | ICD-10-CM | POA: Diagnosis not present

## 2019-05-18 DIAGNOSIS — Z79899 Other long term (current) drug therapy: Secondary | ICD-10-CM | POA: Diagnosis not present

## 2019-05-18 MED ORDER — METHYLPREDNISOLONE SODIUM SUCC 125 MG IJ SOLR
125.0000 mg | Freq: Once | INTRAMUSCULAR | Status: AC
  Administered 2019-05-19: 125 mg via INTRAVENOUS
  Filled 2019-05-18: qty 2

## 2019-05-18 MED ORDER — FAMOTIDINE IN NACL 20-0.9 MG/50ML-% IV SOLN
20.0000 mg | Freq: Once | INTRAVENOUS | Status: AC
  Administered 2019-05-19: 20 mg via INTRAVENOUS
  Filled 2019-05-18: qty 50

## 2019-05-18 NOTE — ED Provider Notes (Signed)
TIME SEEN: 11:50 PM  CHIEF COMPLAINT: Allergic reaction  HPI: Patient is a 58 year old male with previous history of anaphylaxis who presents to the emergency department with [facility] for an allergic reaction.  States about 45 minutes prior to arrival he drank a glass of milk and then developed diffuse hives, swelling around both of his eyes, difficulty swallowing and breathing.  Gave himself epi injection IM 5 minutes prior to EMS arrival.  EMS gave 50 mg of IV Benadryl.  He states he is feeling better but symptoms not completely resolved.  States he feels like he can swallow, speak and breathe normally.  ROS: See HPI Constitutional: no fever  Eyes: no drainage  ENT: no runny nose   Cardiovascular:  no chest pain  Resp:  SOB  GI: no vomiting GU: no dysuria Integumentary: no rash  Allergy:  hives  Musculoskeletal: no leg swelling  Neurological: no slurred speech ROS otherwise negative  PAST MEDICAL HISTORY/PAST SURGICAL HISTORY:  Past Medical History:  Diagnosis Date  . Arthritis    r knee   . Asthma    very rare  . Axillary lymphadenopathy   . Bronchitis   . Chronic anemia    normocytic  . Chronic folliculitis   . Elevated PSA   . Environmental and seasonal allergies   . GERD (gastroesophageal reflux disease)    Patient denies  . Gross hematuria   . H/O pericarditis    2010--  myopercarditis--  resolved  . HCAP (healthcare-associated pneumonia) 11/19/2014  . Headache(784.0)    HX SEVERE FRONTAL HA'S  . Hiatal hernia   . History of concussion    2012  &  2013  RESIDUAL HA'S --  RESOLVED  . History of gastric ulcer   . History of kidney stones   . History of MRSA infection 2010   infected boil  . HIV (human immunodeficiency virus infection) ([place]) 1988  . Hypertension   . Internal hemorrhoids   . Lytic bone lesion of hip    WORK-UP DONE BY ONCOLOGIST DR HA --  NOT MALIGNANT  . Pneumonia    hx of  . Post concussion syndrome    resolved  . Prostate  cancer ([place]) 04/25/13   gleason 3+3=6, volume 30 gm  . Sinusitis, chronic 05/14/2015  . Ulcer    hx of gastric  . Wears glasses     MEDICATIONS:  Prior to Admission medications   Medication Sig Start Date End Date Taking? Authorizing Provider  albuterol (VENTOLIN HFA) 108 (90 Base) MCG/ACT inhaler INHALE 2 PUFFS INTO THE LUNGS EVERY 6 HOURS AS NEEDED FOR WHEEZING OR SHORTNESS OF BREATH 01/06/19   Campbell Riches, MD  ALPRAZolam Duanne Moron) 0.5 MG tablet TAKE 1 TABLET BY MOUTH AT BEDTIME 04/10/19   Dohmeier, Asencion Partridge, MD  aspirin EC 81 MG tablet Take 81 mg by mouth every Monday, Wednesday, and Friday.     [provider]  diclofenac sodium (VOLTAREN) 1 % GEL APPLY 2 GRAMS EXTERNALLY TO THE AFFECTED AREA TWICE DAILY AS NEEDED FOR PAIN 09/02/18   Campbell Riches, MD  emtricitabine-tenofovir (TRUVADA) 200-300 MG tablet Take 1 tablet by mouth daily. 04/25/19   Campbell Riches, MD  gabapentin (NEURONTIN) 300 MG capsule Take 1 capsule (300 mg total) by mouth 4 (four) times daily as needed (pain). 12/04/18   Lomax, Amy, NP  hydrOXYzine (ATARAX/VISTARIL) 25 MG tablet TAKE 1 TABLET(25 MG) BY MOUTH EVERY NIGHT 12/30/18   Dohmeier, Asencion Partridge, MD  ipratropium (ATROVENT) 0.02 %  nebulizer solution USE 2.5 ML(0.5 MG) VIA NEBULIZER THREE TIMES DAILY AS NEEDED FOR WHEEZING OR SHORTNESS OF BREATH Patient taking differently: Take 0.5 mg by nebulization 3 (three) times daily as needed for wheezing or shortness of breath.  11/02/16   Jule Ser, DO  ISENTRESS 400 MG tablet TAKE 2 TABLETS(800 MG) BY MOUTH AT BEDTIME 01/08/19   Golden Circle, FNP  l-methylfolate-B6-B12 (METANX) 3-35-2 MG TABS tablet Take 1 tablet by mouth daily. 07/04/18   Campbell Riches, MD  meclizine (ANTIVERT) 25 MG tablet Take 1 tablet by mouth 2 (two) times daily. 09/09/18   [provider]  meloxicam (MOBIC) 15 MG tablet Take 1 tablet by mouth daily. 03/23/18   [provider]  metoprolol succinate (TOPROL-XL) 25 MG 24  hr tablet Take by mouth.    [provider]  mometasone-formoterol (DULERA) 200-5 MCG/ACT AERO Take 2 puffs first thing in am and then another 2 puffs about 12 hours later. 10/17/18   Tanda Rockers, MD  montelukast (SINGULAIR) 10 MG tablet TAKE 1 TABLET(10 MG) BY MOUTH AT BEDTIME 02/17/19   Tommy Medal, Lavell Islam, MD  Multiple Vitamin (MULTIVITAMIN WITH MINERALS) TABS tablet Take 1 tablet by mouth daily after breakfast.     [provider]  mupirocin ointment (BACTROBAN) 2 % APPLY EXTERNALLY TO THE AFFECTED AREA TWICE DAILY FOR 3 WEEKS, PLACE COTTON BETWEEN TOES TO ALLOW BETTER AERATION AS NEEDED 01/23/19   Campbell Riches, MD  naproxen (NAPROSYN) 500 MG tablet Take 1 tablet by mouth 2 (two) times daily. 09/06/18   [provider]  omeprazole (PRILOSEC) 40 MG capsule TAKE 1 CAPSULE(40 MG) BY MOUTH EVERY MORNING 02/05/19   Pyrtle, Lajuan Lines, MD  ondansetron (ZOFRAN ODT) 8 MG disintegrating tablet Take 1 tablet (8 mg total) by mouth every 8 (eight) hours as needed for nausea or vomiting. 06/06/17   Campbell Riches, MD  Polyethyl Glycol-Propyl Glycol (SYSTANE) 0.4-0.3 % GEL ophthalmic gel Place 1 application into both eyes 2 (two) times daily.     [provider]  SUMAtriptan (IMITREX) 100 MG tablet TAKE 1 TABLET BY MOUTH EVERY 2 HOURS AS NEEDED FOR MIGRAINE, MAY REPEAT IN 2 HOURS IF HEADACHE PERSISTS OR RECURS 03/04/19   Campbell Riches, MD  tizanidine (ZANAFLEX) 6 MG capsule Take 1 capsule (6 mg total) by mouth 3 (three) times daily. 12/04/18   Lomax, Amy, NP  torsemide (DEMADEX) 20 MG tablet Take 20 mg by mouth daily.    [provider]  zolpidem (AMBIEN) 5 MG tablet Take 1-2 tablets (5-10 mg total) by mouth at bedtime as needed for sleep. Patient not taking: Reported on 04/23/2019 12/21/16   Jule Ser, DO    ALLERGIES:  Allergies  Allergen Reactions  . Chocolate Shortness Of Breath and Other (See Comments)    Asthma attack, welts  . Descovy  [Emtricitabine-Tenofovir Af] Shortness Of Breath    Nasuea, vomiiting, diarrhea, sob, whelps, throat closes  . Genvoya [Elviteg-Cobic-Emtricit-Tenofaf] Shortness Of Breath    Nausea, vomitting, diarreha, sob, rash, throat closing  . Morphine And Related Shortness Of Breath, Itching and Other (See Comments)     chest pain  . Penicillins Shortness Of Breath and Rash    Has patient had a PCN reaction causing immediate rash, facial/tongue/throat swelling, SOB or lightheadedness with hypotension: Yes Has patient had a PCN reaction causing severe rash involving mucus membranes or skin necrosis: No Has patient had a PCN reaction that required hospitalization No Has patient had  a PCN reaction occurring within the last 10 years: Yes If all of the above answers are "NO", then may proceed with Cephalosporin use.  . Sulfa Antibiotics Anaphylaxis  . Tomato Shortness Of Breath  . Vancomycin Shortness Of Breath  . Flagyl [Metronidazole] Itching  . Milk-Related Compounds   . Toradol [Ketorolac Tromethamine] Nausea And Vomiting    itching  . Tramadol Itching and Nausea And Vomiting    SOCIAL HISTORY:  Social History   Tobacco Use  . Smoking status: Former Smoker    Packs/day: 1.50    Years: 23.00    Pack years: 34.50    Types: Cigars, Cigarettes    Quit date: 05/22/2010    Years since quitting: 8.9  . Smokeless tobacco: Never Used  Substance Use Topics  . Alcohol use: No    Alcohol/week: 0.0 standard drinks    FAMILY HISTORY: Family History  Problem Relation Age of Onset  . Stroke Father   . Diabetes Father   . Cancer Father        brain cancer  . Asthma Father   . Hypertension Sister   . Cancer Maternal Uncle        prostate cancer  . Asthma Sister   . Colon cancer Neg Hx   . Colon polyps Neg Hx   . Esophageal cancer Neg Hx   . Stomach cancer Neg Hx   . Rectal cancer Neg Hx     EXAM: BP (!) 144/89   Pulse 75   Temp 98.2 F (36.8 C) (Oral)   Resp 20   SpO2 95%   CONSTITUTIONAL: Alert and oriented and responds appropriately to questions. Well-appearing; well-nourished HEAD: Normocephalic EYES: Conjunctivae clear, pupils appear equal, EOMI, bilateral periorbital edema without erythema or warmth ENT: normal nose; moist mucous membranes, posterior oropharynx does not appear edematous, no stridor, no trismus or drooling, normal phonation, no angioedema of the lips or tongue NECK: Supple, no meningismus, no nuchal rigidity, no LAD  CARD: RRR; S1 and S2 appreciated; no murmurs, no clicks, no rubs, no gallops RESP: Normal chest excursion without splinting or tachypnea; breath sounds clear and equal bilaterally; no wheezes, no rhonchi, no rales, no hypoxia or respiratory distress, speaking full sentences ABD/GI: Normal bowel sounds; non-distended; soft, non-tender, no rebound, no guarding, no peritoneal signs, no hepatosplenomegaly BACK:  The back appears normal and is non-tender to palpation, there is no CVA tenderness EXT: Normal ROM in all joints; non-tender to palpation; no edema; normal capillary refill; no cyanosis, no calf tenderness or swelling    SKIN: Normal color for age and race; warm; diffuse urticaria, no other rash NEURO: Moves all extremities equally PSYCH: The patient's mood and manner are appropriate. Grooming and personal hygiene are appropriate.  MEDICAL DECISION MAKING: Patient here with allergic reaction.  Symptoms improving with epi at home and Benadryl with EMS.  Will give famotidine, Solu-Medrol and monitor closely.  Blood pressure normal.  No hypoxia or wheezing.  No swelling of the airway currently.  ED PROGRESS: 1:15 AM  Pt's periorbital edema has almost completely resolved.  Hives have resolved as well.  Still states he feels like at times he has difficulty breathing, swallowing but no changes in phonation.  Posterior pharynx still appears normal without edema.  No angioedema.  He states that he thinks he is just panicking when he has  the symptoms.  I do not feel further medications are needed at this time but we will continue to monitor closely until at least 4  hours after epi was given.  He also reports he is starting to have a migraine headache and takes Imitrex at home.  Would like a subcutaneous injection here.     4:13 AM  Pt reports he feels back to baseline.  No difficulty swallowing, speaking or breathing.  Hives have resolved and periorbital edema is him is completely resolved.  Will discharge with Pepcid, steroid taper, refill of his EpiPen.  Have advised him to use Benadryl over-the-counter as well.  We will give him outpatient referral to [facility] specialist.  Also reports his migraine headache is improving.  Declines further medication.    I feel patient and wife are safe to be discharged home.  They agree with this plan.   At this time, I do not feel there is any life-threatening condition present. I have reviewed, interpreted and discussed all results (EKG, imaging, lab, urine as appropriate) and exam findings with patient/family. I have reviewed nursing notes and appropriate previous records.  I feel the patient is safe to be discharged home without further emergent workup and can continue workup as an outpatient as needed. Discussed usual and customary return precautions. Patient/family verbalize understanding and are comfortable with this plan.  Outpatient follow-up has been provided as needed. All questions have been answered.   CRITICAL CARE Performed by: Pryor Curia   Total critical care time: 43 minutes  Critical care time was exclusive of separately billable procedures and treating other patients.  Critical care was necessary to treat or prevent imminent or life-threatening deterioration.  Critical care was time spent personally by me on the following activities: development of treatment plan with patient and/or surrogate as well as nursing, discussions with consultants, evaluation of  patient's response to treatment, examination of patient, obtaining history from patient or surrogate, ordering and performing treatments and interventions, ordering and review of laboratory studies, ordering and review of radiographic studies, pulse oximetry and re-evaluation of patient's condition.   TYGA KACH was evaluated in Emergency Department on 05/18/2019 for the symptoms described in the history of present illness. He was evaluated in the context of the global COVID-19 pandemic, which necessitated consideration that the patient might be at risk for infection with the SARS-CoV-2 virus that causes COVID-19. Institutional protocols and algorithms that pertain to the evaluation of patients at risk for COVID-19 are in a state of rapid change based on information released by regulatory bodies including the CDC and federal and state organizations. These policies and algorithms were followed during the patient's care in the ED.    Meilani Edmundson, Delice Bison, DO 05/19/19 [phone]

## 2019-05-18 NOTE — ED Triage Notes (Signed)
Patient had a glass of milk, starting having shortness of breath, EMS arrived and noted swelling to eyes, hives on stomach, chest and arms.  Patient having more difficulty talking and swallowing.  Sister gave patient epi pen at home before EMS arrival.  EMS gave 50mg  IV benadryl.  Patient is CAOx4 upon arrival, still has swelling to eyes.

## 2019-05-19 MED ORDER — FAMOTIDINE 20 MG PO TABS: 20.0000 mg | ORAL_TABLET | Freq: Two times a day (BID) | ORAL | 0 refills | Status: DC

## 2019-05-19 MED ORDER — SUMATRIPTAN SUCCINATE 6 MG/0.5ML ~~LOC~~ SOLN
6.0000 mg | Freq: Once | SUBCUTANEOUS | Status: AC
  Administered 2019-05-19: 02:00:00 6 mg via SUBCUTANEOUS
  Filled 2019-05-19: qty 0.5

## 2019-05-19 MED ORDER — PREDNISONE 10 MG (21) PO TBPK: ORAL_TABLET | Freq: Every day | ORAL | 0 refills | Status: DC

## 2019-05-19 MED ORDER — EPINEPHRINE 0.3 MG/0.3ML IJ SOAJ: 0.3000 mg | INTRAMUSCULAR | 3 refills | Status: DC | PRN

## 2019-05-19 NOTE — ED Notes (Signed)
Wife called out stating that pt felt like his throat "was closing up". RN went into room. Pt stated it felt the same however "every now and then I get a panic that I can't swallow."  Pt states he is able to swallow, just feels like I can't.  Provider aware.

## 2019-05-19 NOTE — Discharge Instructions (Signed)
You may continue over-the-counter Benadryl 50 mg (2 tablets) every 8 hours as needed.

## 2019-05-20 ENCOUNTER — Other Ambulatory Visit: Payer: Self-pay | Admitting: *Deleted

## 2019-05-20 MED ORDER — OMEPRAZOLE 40 MG PO CPDR: DELAYED_RELEASE_CAPSULE | ORAL | 3 refills | Status: DC

## 2019-05-27 ENCOUNTER — Other Ambulatory Visit: Payer: Self-pay | Admitting: Infectious Diseases

## 2019-05-27 DIAGNOSIS — G43009 Migraine without aura, not intractable, without status migrainosus: Secondary | ICD-10-CM

## 2019-06-02 ENCOUNTER — Encounter: Payer: Self-pay | Admitting: Family Medicine

## 2019-06-02 DIAGNOSIS — G629 Polyneuropathy, unspecified: Secondary | ICD-10-CM | POA: Insufficient documentation

## 2019-06-02 NOTE — Telephone Encounter (Signed)
Updated problem list

## 2019-06-15 ENCOUNTER — Other Ambulatory Visit: Payer: Self-pay | Admitting: Infectious Diseases

## 2019-06-15 DIAGNOSIS — G8929 Other chronic pain: Secondary | ICD-10-CM

## 2019-06-15 DIAGNOSIS — M25532 Pain in left wrist: Secondary | ICD-10-CM

## 2019-06-16 ENCOUNTER — Other Ambulatory Visit: Payer: Self-pay | Admitting: Infectious Diseases

## 2019-06-16 DIAGNOSIS — G609 Hereditary and idiopathic neuropathy, unspecified: Secondary | ICD-10-CM

## 2019-06-16 DIAGNOSIS — B20 Human immunodeficiency virus [HIV] disease: Secondary | ICD-10-CM

## 2019-06-16 MED ORDER — ISENTRESS 400 MG PO TABS: ORAL_TABLET | ORAL | 2 refills | Status: DC

## 2019-06-17 ENCOUNTER — Other Ambulatory Visit: Payer: Self-pay | Admitting: Infectious Diseases

## 2019-06-17 DIAGNOSIS — J302 Other seasonal allergic rhinitis: Secondary | ICD-10-CM

## 2019-06-20 ENCOUNTER — Other Ambulatory Visit: Payer: Self-pay | Admitting: Infectious Diseases

## 2019-06-20 DIAGNOSIS — G8929 Other chronic pain: Secondary | ICD-10-CM

## 2019-06-23 ENCOUNTER — Other Ambulatory Visit: Payer: Medicare Other

## 2019-06-23 ENCOUNTER — Telehealth: Payer: Self-pay | Admitting: *Deleted

## 2019-06-23 NOTE — Telephone Encounter (Signed)
Patient located in [place] right now. Per sister, patient does not have sense of smell or taste and fever/night sweats (99.9 - 100). This started a few days ago. He is also having dysuria, worried he has blood in his urine. RN encouraged April to have Jeneen Rinks do a virtual visit through [facility] for evaluation, confirmed upcoming appointment 1/4 with Marya Amsler, and asked if they needed primary care referral. Landis Gandy, RN

## 2019-07-01 ENCOUNTER — Encounter (HOSPITAL_COMMUNITY): Payer: Self-pay

## 2019-07-01 ENCOUNTER — Other Ambulatory Visit: Payer: Self-pay

## 2019-07-01 ENCOUNTER — Ambulatory Visit (HOSPITAL_COMMUNITY)
Admission: EM | Admit: 2019-07-01 | Discharge: 2019-07-01 | Disposition: A | Discharge status: Home / Self Care | Payer: Medicare Other | Attending: Family Medicine | Admitting: Family Medicine

## 2019-07-01 DIAGNOSIS — K219 Gastro-esophageal reflux disease without esophagitis: Secondary | ICD-10-CM | POA: Insufficient documentation

## 2019-07-01 DIAGNOSIS — R319 Hematuria, unspecified: Secondary | ICD-10-CM | POA: Diagnosis not present

## 2019-07-01 DIAGNOSIS — Z87891 Personal history of nicotine dependence: Secondary | ICD-10-CM | POA: Insufficient documentation

## 2019-07-01 DIAGNOSIS — Z823 Family history of stroke: Secondary | ICD-10-CM | POA: Insufficient documentation

## 2019-07-01 DIAGNOSIS — R439 Unspecified disturbances of smell and taste: Secondary | ICD-10-CM | POA: Diagnosis not present

## 2019-07-01 DIAGNOSIS — R109 Unspecified abdominal pain: Secondary | ICD-10-CM

## 2019-07-01 DIAGNOSIS — Z7951 Long term (current) use of inhaled steroids: Secondary | ICD-10-CM | POA: Insufficient documentation

## 2019-07-01 DIAGNOSIS — J449 Chronic obstructive pulmonary disease, unspecified: Secondary | ICD-10-CM | POA: Diagnosis not present

## 2019-07-01 DIAGNOSIS — I129 Hypertensive chronic kidney disease with stage 1 through stage 4 chronic kidney disease, or unspecified chronic kidney disease: Secondary | ICD-10-CM | POA: Diagnosis not present

## 2019-07-01 DIAGNOSIS — Z888 Allergy status to other drugs, medicaments and biological substances status: Secondary | ICD-10-CM | POA: Insufficient documentation

## 2019-07-01 DIAGNOSIS — Z8249 Family history of ischemic heart disease and other diseases of the circulatory system: Secondary | ICD-10-CM | POA: Diagnosis not present

## 2019-07-01 DIAGNOSIS — N182 Chronic kidney disease, stage 2 (mild): Secondary | ICD-10-CM | POA: Diagnosis not present

## 2019-07-01 DIAGNOSIS — Z882 Allergy status to sulfonamides status: Secondary | ICD-10-CM | POA: Insufficient documentation

## 2019-07-01 DIAGNOSIS — Z825 Family history of asthma and other chronic lower respiratory diseases: Secondary | ICD-10-CM | POA: Insufficient documentation

## 2019-07-01 DIAGNOSIS — M1990 Unspecified osteoarthritis, unspecified site: Secondary | ICD-10-CM | POA: Insufficient documentation

## 2019-07-01 DIAGNOSIS — Z880 Allergy status to penicillin: Secondary | ICD-10-CM | POA: Insufficient documentation

## 2019-07-01 DIAGNOSIS — Z20828 Contact with and (suspected) exposure to other viral communicable diseases: Secondary | ICD-10-CM | POA: Insufficient documentation

## 2019-07-01 DIAGNOSIS — Z885 Allergy status to narcotic agent status: Secondary | ICD-10-CM | POA: Diagnosis not present

## 2019-07-01 DIAGNOSIS — Z87442 Personal history of urinary calculi: Secondary | ICD-10-CM | POA: Insufficient documentation

## 2019-07-01 DIAGNOSIS — Z7982 Long term (current) use of aspirin: Secondary | ICD-10-CM | POA: Insufficient documentation

## 2019-07-01 DIAGNOSIS — Z833 Family history of diabetes mellitus: Secondary | ICD-10-CM | POA: Insufficient documentation

## 2019-07-01 DIAGNOSIS — Z79899 Other long term (current) drug therapy: Secondary | ICD-10-CM | POA: Insufficient documentation

## 2019-07-01 DIAGNOSIS — Z881 Allergy status to other antibiotic agents status: Secondary | ICD-10-CM | POA: Insufficient documentation

## 2019-07-01 DIAGNOSIS — Z21 Asymptomatic human immunodeficiency virus [HIV] infection status: Secondary | ICD-10-CM | POA: Insufficient documentation

## 2019-07-01 DIAGNOSIS — G629 Polyneuropathy, unspecified: Secondary | ICD-10-CM | POA: Insufficient documentation

## 2019-07-01 DIAGNOSIS — Z8546 Personal history of malignant neoplasm of prostate: Secondary | ICD-10-CM | POA: Insufficient documentation

## 2019-07-01 LAB — POCT URINALYSIS DIP (DEVICE)
Bilirubin Urine: NEGATIVE
Glucose, UA: NEGATIVE mg/dL
Hgb urine dipstick: NEGATIVE
Ketones, ur: NEGATIVE mg/dL
Nitrite: NEGATIVE
Protein, ur: 100 mg/dL — AB
Specific Gravity, Urine: 1.025 (ref 1.005–1.030)
Urobilinogen, UA: 0.2 mg/dL (ref 0.0–1.0)
pH: 5.5 (ref 5.0–8.0)

## 2019-07-01 NOTE — ED Triage Notes (Signed)
Pt states he has a lost of taste and smell. Pt states he thinks he has blood in his urine.  This has been going on for a week now.

## 2019-07-01 NOTE — Discharge Instructions (Addendum)
Keep taking the Aleve for your pain as needed Make sure you are drinking plenty of water. Covid swab sent for testing with labs pending and I will call you with any positive results. Make sure you are taking precautions and staying home until we get your results Make sure you call your your urologist today for follow-up

## 2019-07-02 ENCOUNTER — Telehealth (HOSPITAL_COMMUNITY): Payer: Self-pay | Admitting: Family Medicine

## 2019-07-02 MED ORDER — CIPROFLOXACIN HCL 500 MG PO TABS: 500.0000 mg | ORAL_TABLET | Freq: Two times a day (BID) | ORAL | 0 refills | Status: AC

## 2019-07-02 NOTE — Telephone Encounter (Signed)
Greater than 1000 colonies of gram-negative bacteria on urine culture Treating with Cipro twice a day for 7 days Call patient to give results and sent medicine to pharmacy.

## 2019-07-02 NOTE — ED Provider Notes (Signed)
[place]    CSN: [id number] Arrival date & time: 07/01/19  [id number]      History   Chief Complaint Chief Complaint  Patient presents with  . Covid test    HPI Casey Brennan is a 58 y.o. male.   Patient is a 58 year old male with past medical history of arthritis, asthma, bronchitis, GERD, pericarditis, prostate cancer, kidney stones, ulcer, HIV, hypertension.  He presents today requesting Covid test.  Reports that he has lost his taste and smell.  He also believes he may have blood in his urine.  No abdominal pain, dysuria, urinary frequency.  He has chronic kidney stones.  Mild right flank pain.  This has been going on for approximately 1 week.  Patient is a Administrator and travels cross-country.  Denies any fever, chills, body aches, night sweats.  Denies any cough or chest congestion.  Denies any chest pain or shortness of breath.  ROS per HPI      Past Medical History:  Diagnosis Date  . Arthritis    r knee   . Asthma    very rare  . Axillary lymphadenopathy   . Bronchitis   . Chronic anemia    normocytic  . Chronic folliculitis   . Elevated PSA   . Environmental and seasonal allergies   . GERD (gastroesophageal reflux disease)    Patient denies  . Gross hematuria   . H/O pericarditis    2010--  myopercarditis--  resolved  . HCAP (healthcare-associated pneumonia) 11/19/2014  . Headache(784.0)    HX SEVERE FRONTAL HA'S  . Hiatal hernia   . History of concussion    2012  &  2013  RESIDUAL HA'S --  RESOLVED  . History of gastric ulcer   . History of kidney stones   . History of MRSA infection 2010   infected boil  . HIV (human immunodeficiency virus infection) ([place]) 1988  . Hypertension   . Internal hemorrhoids   . Lytic bone lesion of hip    WORK-UP DONE BY ONCOLOGIST DR HA --  NOT MALIGNANT  . 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 Pneumonia    hx of  . Post concussion syndrome    resolved  . Prostate cancer ([place]) 04/25/13   gleason  3+3=6, volume 30 gm  . Sinusitis, chronic 05/14/2015  . Ulcer    hx of gastric  . Wears glasses     Patient Active Problem List   Diagnosis Date Noted  . Peripheral neuropathy 06/02/2019  . Healthcare maintenance 01/08/2019  . Tension headache 11/12/2017  . Hepatitis B immune 06/06/2017  . Chronic pain of left wrist 06/06/2017  . Constipation 09/27/2016  . Primary osteoarthritis of right knee 08/01/2016  . Chronic renal insufficiency, stage 2 (mild) 05/22/2016  . Skin lesion of right lower extremity 02/22/2016  . Migraines 02/22/2016  . Low back pain 02/22/2016  . Osteoarthritis of right knee 01/11/2016  . Kidney stones 10/25/2015  . Sinusitis, chronic 05/14/2015  . Cough 01/18/2015  . GERD (gastroesophageal reflux disease) 07/29/2014  . COPD GOLD III 07/22/2014  . Hx of primary hypertension 04/27/2014  . H/O prostate cancer   . Lytic bone lesion of hip 08/22/2012  . Cataract, nuclear 05/15/2012  . Asthma 01/23/2012  . Insomnia 05/11/2011  . HTN (hypertension) 04/17/2011  . Mood disorder in conditions classified elsewhere 06/14/2009  . Human immunodeficiency virus (HIV) disease ([place]) 04/19/2009    Past Surgical History:  Procedure Laterality Date  .  CARDIAC CATHETERIZATION  03-23-2009  DR COOPER   NORMAL CORONARY ARTERIES  . CHOLECYSTECTOMY N/A 11/06/2014   Procedure: LAPAROSCOPIC CHOLECYSTECTOMY WITH INTRAOPERATIVE CHOLANGIOGRAM;  Surgeon: Judeth Horn, MD;  Location: [facility];  Service: General;  Laterality: N/A;  . COLONOSCOPY  12/26/2011   Procedure: COLONOSCOPY;  Surgeon: Lear Ng, MD;  Location: [facility];  Service: Endoscopy;  Laterality: N/A;  . COLONOSCOPY    . COLONOSCOPY WITH PROPOFOL N/A 07/29/2014   Procedure: COLONOSCOPY WITH PROPOFOL;  Surgeon: Lear Ng, MD;  Location: [facility];  Service: Endoscopy;  Laterality: N/A;  . CYSTOSCOPY N/A 05/18/2018   Procedure: CYSTOSCOPY REMOVAL FOREIGN BODY;  Surgeon: Kathie Rhodes, MD;  Location: [facility];  Service: Urology;  Laterality: N/A;  . CYSTOSCOPY/RETROGRADE/URETEROSCOPY Bilateral 04/25/2013   Procedure: CYSTOSCOPY/ BILATERAL RETROGRADES; BLADDER BIOPSIES;  Surgeon: Alexis Frock, MD;  Location: [facility];  Service: Urology;  Laterality: Bilateral;  . DENTAL EXAMINATION UNDER ANESTHESIA    . ESOPHAGOGASTRODUODENOSCOPY  12/26/2011   Procedure: ESOPHAGOGASTRODUODENOSCOPY (EGD);  Surgeon: Lear Ng, MD;  Location: [facility];  Service: Endoscopy;  Laterality: N/A;  . ESOPHAGOGASTRODUODENOSCOPY (EGD) WITH PROPOFOL N/A 07/29/2014   Procedure: ESOPHAGOGASTRODUODENOSCOPY (EGD) WITH PROPOFOL;  Surgeon: Lear Ng, MD;  Location: [facility];  Service: Endoscopy;  Laterality: N/A;  . EXCISION CHRONIC LEFT BREAST ABSCESS  09-21-2010  . EXCISIONAL BX LEFT BREAST MASS/  I  &  D LEFT BREAST ABSCESS  03-24-2009  . KNEE ARTHROSCOPY Right 1985  . left axilla biopsy  04/2013  . left hip biopsy  10/2012  . LYMPHADENECTOMY Bilateral 08/18/2013   Procedure: LYMPHADENECTOMY "PELVIC LYMPH NODE DISSECTION";  Surgeon: Alexis Frock, MD;  Location: [facility];  Service: Urology;  Laterality: Bilateral;  . PROSTATE BIOPSY N/A 04/25/2013   Procedure: BIOPSY TRANSRECTAL ULTRASONIC PROSTATE (TUBP);  Surgeon: Alexis Frock, MD;  Location: [facility];  Service: Urology;  Laterality: N/A;  . ROBOT ASSISTED LAPAROSCOPIC RADICAL PROSTATECTOMY N/A 08/18/2013   Procedure: ROBOTIC ASSISTED LAPAROSCOPIC RADICAL PROSTATECTOMY;  Surgeon: Alexis Frock, MD;  Location: [facility];  Service: Urology;  Laterality: N/A;  . TOTAL KNEE ARTHROPLASTY Right 08/01/2016   08/17/16  . TOTAL KNEE ARTHROPLASTY Right 08/01/2016   Procedure: TOTAL KNEE ARTHROPLASTY;  Surgeon: Renette Butters, MD;  Location: [facility];  Service: Orthopedics;  Laterality: Right;  . UPPER GASTROINTESTINAL ENDOSCOPY    . UPPER LEG SOFT TISSUE BIOPSY Left 2012   [facility]       Home Medications    Prior to  Admission medications   Medication Sig Start Date End Date Taking? Authorizing Provider  albuterol (VENTOLIN HFA) 108 (90 Base) MCG/ACT inhaler INHALE 2 PUFFS INTO THE LUNGS EVERY 6 HOURS AS NEEDED FOR WHEEZING OR SHORTNESS OF BREATH Patient taking differently: Inhale 2 puffs into the lungs every 6 (six) hours as needed for wheezing or shortness of breath.  01/06/19   Campbell Riches, MD  ALPRAZolam Duanne Moron) 0.5 MG tablet TAKE 1 TABLET BY MOUTH AT BEDTIME Patient taking differently: Take 0.5 mg by mouth at bedtime.  04/10/19   Dohmeier, Asencion Partridge, MD  aspirin EC 81 MG tablet Take 81 mg by mouth every Monday, Wednesday, and Friday.     [provider]  diclofenac Sodium (VOLTAREN) 1 % GEL APPLY 2 GRAMS EXTERNALLY TO THE AFFECTED AREA TWICE DAILY AS NEEDED FOR PAIN 06/16/19   Campbell Riches, MD  EPINEPHrine 0.3 mg/0.3 mL IJ SOAJ injection Inject 0.3 mLs (0.3 mg total) into the muscle as needed  for anaphylaxis. 05/19/19   Ward, Delice Bison, DO  famotidine (PEPCID) 20 MG tablet Take 1 tablet (20 mg total) by mouth 2 (two) times daily. 05/19/19   Ward, Delice Bison, DO  gabapentin (NEURONTIN) 300 MG capsule Take 1 capsule (300 mg total) by mouth 4 (four) times daily as needed (pain). 12/04/18   Lomax, Amy, NP  hydrOXYzine (ATARAX/VISTARIL) 25 MG tablet TAKE 1 TABLET(25 MG) BY MOUTH EVERY NIGHT Patient taking differently: Take 25 mg by mouth at bedtime.  12/30/18   Dohmeier, Asencion Partridge, MD  ipratropium (ATROVENT) 0.02 % nebulizer solution USE 2.5 ML(0.5 MG) VIA NEBULIZER THREE TIMES DAILY AS NEEDED FOR WHEEZING OR SHORTNESS OF BREATH Patient taking differently: Take 0.5 mg by nebulization 3 (three) times daily as needed for wheezing or shortness of breath.  11/02/16   Jule Ser, DO  ISENTRESS 400 MG tablet TAKE 2 TABLETS(800 MG) BY MOUTH AT BEDTIME 06/16/19   Golden Circle, FNP  l-methylfolate-B6-B12 (METANX) 3-35-2 MG TABS tablet Take 1 tablet by mouth daily. 07/04/18   Campbell Riches, MD    meclizine (ANTIVERT) 25 MG tablet Take 1 tablet by mouth 2 (two) times daily. 09/09/18   [provider]  meloxicam (MOBIC) 15 MG tablet Take 1 tablet by mouth daily. 03/23/18   [provider]  metoprolol succinate (TOPROL-XL) 25 MG 24 hr tablet Take 25 mg by mouth daily.     [provider]  mometasone-formoterol (DULERA) 200-5 MCG/ACT AERO Take 2 puffs first thing in am and then another 2 puffs about 12 hours later. Patient taking differently: Inhale 2 puffs into the lungs 2 (two) times daily.  10/17/18   Tanda Rockers, MD  montelukast (SINGULAIR) 10 MG tablet TAKE 1 TABLET(10 MG) BY MOUTH AT BEDTIME 06/17/19   Tommy Medal, Lavell Islam, MD  Multiple Vitamin (MULTIVITAMIN WITH MINERALS) TABS tablet Take 1 tablet by mouth daily after breakfast.     [provider]  naproxen (NAPROSYN) 500 MG tablet Take 1 tablet by mouth 2 (two) times daily. 09/06/18   [provider]  omeprazole (PRILOSEC) 40 MG capsule TAKE 1 CAPSULE(40 MG) BY MOUTH EVERY MORNING 05/20/19   Pyrtle, Lajuan Lines, MD  ondansetron (ZOFRAN ODT) 8 MG disintegrating tablet Take 1 tablet (8 mg total) by mouth every 8 (eight) hours as needed for nausea or vomiting. 06/06/17   Campbell Riches, MD  Polyethyl Glycol-Propyl Glycol (SYSTANE) 0.4-0.3 % GEL ophthalmic gel Place 1 application into both eyes 2 (two) times daily.     [provider]  predniSONE (STERAPRED UNI-PAK 21 TAB) 10 MG (21) TBPK tablet Take by mouth daily. Take 6 tabs by mouth daily for 1 day, then 5 tabs for 1 day, then 4 tabs for 1 day, then 3 tabs for 1 day, 2 tabs for 1 day, then 1 tab by mouth daily for 1 day 05/19/19   Ward, Cyril Mourning N, DO  SUMAtriptan (IMITREX) 100 MG tablet TAKE 1 TABLET BY MOUTH EVERY 2 HOURS AS NEEDED FOR MIGRAINE, MAY REPEAT IN 2 HOURS IF HEADACHE PERSISTS OR RECURS 05/27/19   Campbell Riches, MD  tizanidine (ZANAFLEX) 6 MG capsule Take 1 capsule (6 mg total) by mouth 3 (three) times daily. 12/04/18    Lomax, Amy, NP  torsemide (DEMADEX) 20 MG tablet Take 20 mg by mouth daily.    [provider]  TRUVADA 200-300 MG tablet TAKE 1 TABLET BY MOUTH DAILY 06/16/19   Golden Circle, FNP  zolpidem (AMBIEN) 5 MG  tablet Take 1-2 tablets (5-10 mg total) by mouth at bedtime as needed for sleep. Patient not taking: Reported on 04/23/2019 12/21/16 05/19/19  Jule Ser, DO    Family History Family History  Problem Relation Age of Onset  . Stroke Father   . Diabetes Father   . Cancer Father        brain cancer  . Asthma Father   . Hypertension Sister   . Cancer Maternal Uncle        prostate cancer  . Asthma Sister   . Colon cancer Neg Hx   . Colon polyps Neg Hx   . Esophageal cancer Neg Hx   . Stomach cancer Neg Hx   . Rectal cancer Neg Hx     Social History Social History   Tobacco Use  . Smoking status: Former Smoker    Packs/day: 1.50    Years: 23.00    Pack years: 34.50    Types: Cigars, Cigarettes    Quit date: 05/22/2010    Years since quitting: 9.1  . Smokeless tobacco: Never Used  Substance Use Topics  . Alcohol use: No    Alcohol/week: 0.0 standard drinks  . Drug use: No     Allergies   Chocolate, Descovy [emtricitabine-tenofovir af], Genvoya [elviteg-cobic-emtricit-tenofaf], Morphine and related, Penicillins, Sulfa antibiotics, Tomato, Vancomycin, Flagyl [metronidazole], Milk-related compounds, Toradol [ketorolac tromethamine], and Tramadol   Review of Systems Review of Systems   Physical Exam Triage Vital Signs ED Triage Vitals  Enc Vitals Group     BP 07/01/19 1032 129/83     Pulse Rate 07/01/19 1032 81     Resp --      Temp 07/01/19 1032 98.1 F (36.7 C)     Temp Source 07/01/19 1032 Oral     SpO2 07/01/19 1032 100 %     Weight 07/01/19 1030 210 lb (95.3 kg)     Height --      Head Circumference --      Peak Flow --      Pain Score 07/01/19 1030 6     Pain Loc --      Pain Edu? --      Excl. in [place]? --    No data  found.  Updated Vital Signs BP 129/83 (BP Location: Right Arm)   Pulse 81   Temp 98.1 F (36.7 C) (Oral)   Wt 210 lb (95.3 kg)   SpO2 100%   BMI 31.01 kg/m   Visual Acuity Right Eye Distance:   Left Eye Distance:   Bilateral Distance:    Right Eye Near:   Left Eye Near:    Bilateral Near:     Physical Exam Vitals and nursing note reviewed.  Constitutional:      Appearance: Normal appearance.  HENT:     Head: Normocephalic and atraumatic.     Nose: Nose normal.  Eyes:     Conjunctiva/sclera: Conjunctivae normal.  Pulmonary:     Effort: Pulmonary effort is normal.  Abdominal:     Palpations: Abdomen is soft.     Tenderness: There is no abdominal tenderness.  Musculoskeletal:        General: Normal range of motion.     Cervical back: Normal range of motion.  Skin:    General: Skin is warm and dry.  Neurological:     Mental Status: He is alert.  Psychiatric:        Mood and Affect: Mood normal.      UC Treatments /  Results  Labs (all labs ordered are listed, but only abnormal results are displayed) Labs Reviewed  URINE CULTURE - Abnormal; Notable for the following components:      Result Value   Culture >=100,000 COLONIES/mL GRAM NEGATIVE RODS (*)    All other components within normal limits  POCT URINALYSIS DIP (DEVICE) - Abnormal; Notable for the following components:   Protein, ur 100 (*)    Leukocytes,Ua SMALL (*)    All other components within normal limits  NOVEL CORONAVIRUS, NAA (HOSP ORDER, SEND-OUT TO REF LAB; TAT 18-24 HRS)    EKG   Radiology No results found.  Procedures Procedures (including critical care time)  Medications Ordered in UC Medications - No data to display  Initial Impression / Assessment and Plan / UC Course  I have reviewed the triage vital signs and the nursing notes.  Pertinent labs & imaging results that were available during my care of the patient were reviewed by me and considered in my medical decision making  (see chart for details).  Clinical Course as of Jul 01 1045  Wed Jul 02, 2019  1045 Culture(!): >=100,000 COLONIES/mL GRAM NEGATIVE RODS [TB]    Clinical Course User Index [TB] Orvan July, NP    58 year old male here for Covid testing due to loss of taste and smell.  Covid swab sent for testing with labs pending.  Also reporting some hematuria and mild right flank pain. Urine with small leuks but without blood We will send for culture and treat based on culture results. Recommended keep taking the Aleve as needed for pain and push fluids Recommend follow-up with urologist Final Clinical Impressions(s) / UC Diagnoses   Final diagnoses:  Flank pain     Discharge Instructions     Keep taking the Aleve for your pain as needed Make sure you are drinking plenty of water. Covid swab sent for testing with labs pending and I will call you with any positive results. Make sure you are taking precautions and staying home until we get your results Make sure you call your your urologist today for follow-up    ED Prescriptions    None     PDMP not reviewed this encounter.   Orvan July, NP 07/02/19 1047

## 2019-07-03 LAB — URINE CULTURE: Culture: >=100000 — AB

## 2019-07-03 LAB — NOVEL CORONAVIRUS, NAA (HOSP ORDER, SEND-OUT TO REF LAB; TAT 18-24 HRS): SARS-CoV-2, NAA: NOT DETECTED

## 2019-07-07 ENCOUNTER — Ambulatory Visit (INDEPENDENT_AMBULATORY_CARE_PROVIDER_SITE_OTHER): Payer: Medicare Other | Admitting: Family

## 2019-07-07 ENCOUNTER — Encounter: Payer: Self-pay | Admitting: Family

## 2019-07-07 ENCOUNTER — Other Ambulatory Visit: Payer: Self-pay

## 2019-07-07 VITALS — BP 151/96 | HR 76 | Temp 98.0°F | Ht 69.0 in | Wt 213.0 lb

## 2019-07-07 DIAGNOSIS — Z0000 Encounter for general adult medical examination without abnormal findings: Secondary | ICD-10-CM

## 2019-07-07 DIAGNOSIS — B20 Human immunodeficiency virus [HIV] disease: Secondary | ICD-10-CM | POA: Diagnosis not present

## 2019-07-07 DIAGNOSIS — R1084 Generalized abdominal pain: Secondary | ICD-10-CM | POA: Diagnosis not present

## 2019-07-07 MED ORDER — EMTRICITABINE-TENOFOVIR DF 200-300 MG PO TABS: 1.0000 | ORAL_TABLET | Freq: Every day | ORAL | 5 refills | Status: DC

## 2019-07-07 MED ORDER — ISENTRESS 400 MG PO TABS: ORAL_TABLET | ORAL | 5 refills | Status: DC

## 2019-07-07 NOTE — Assessment & Plan Note (Signed)
Mr. Caffrey continues to have right-sided flank pain has been refractory to ciprofloxacin with no other urinary symptoms at present.  He has concern for nephrolithiasis.  Will obtain CT scan.  Continue current dose of ciprofloxacin until complete.  May need referral to urology if symptoms continue.

## 2019-07-07 NOTE — Assessment & Plan Note (Addendum)
   Declines vaccination today.  Discussed importance of safe sexual practice to reduce risk of STI.  Declines condoms.  Colon cancer screening up-to-date per recommendations.

## 2019-07-07 NOTE — Progress Notes (Signed)
Subjective:    Patient ID: Casey Brennan, male    DOB: [birth year], 59 y.o.   MRN: [id number]  Chief Complaint  Patient presents with  . HIV Positive/AIDS  . Flank Pain     HPI:  Casey Brennan is a 59 y.o. male with HIV disease who was last seen in the office on 01/08/2019 with good adherence and tolerance to his ART regimen of Isentress and Truvada.  Viral load at the time was undetectable with CD4 count of 525.  No recent blood work completed.  He was recently seen in the emergency department on 07/01/2019 for flank pain with concern for kidney stone or urinary tract infection was treated with 7 days of ciprofloxacin.  Healthcare maintenance due includes influenza and Prevnar vaccinations.  Casey Brennan continues to take his Isentress and Truvada as prescribed with no adverse side effects or missed doses.  He continues to have flank pain that has been refractory to taking ciprofloxacin as prescribed.  Urinary cultures reviewed and positive for pansensitive E. Coli.  Does not have any other urinary symptoms at the present time. Denies fevers, chills, night sweats, headaches, changes in vision, neck pain/stiffness, nausea, diarrhea, vomiting, lesions or rashes.  Casey Brennan has no problems obtaining his medication from the pharmacy and remains covered [place] healthcare Medicare.  He is currently off the road and continues to drive across the country.  Denies any feelings of being down, depressed, or hopeless recently.  No recreational or illicit drug use, tobacco use, or alcohol consumption.  He is not currently sexually active.  Declines condoms and vaccinations today.     Allergies  Allergen Reactions  . Chocolate Shortness Of Breath and Other (See Comments)    Asthma attack, welts  . Descovy [Emtricitabine-Tenofovir Af] Shortness Of Breath    Nasuea, vomiiting, diarrhea, sob, whelps, throat closes  . Genvoya [Elviteg-Cobic-Emtricit-Tenofaf] Shortness Of Breath    Nausea, vomitting,  diarreha, sob, rash, throat closing  . Morphine And Related Shortness Of Breath, Itching and Other (See Comments)     chest pain  . Penicillins Shortness Of Breath and Rash    Has patient had a PCN reaction causing immediate rash, facial/tongue/throat swelling, SOB or lightheadedness with hypotension: Yes Has patient had a PCN reaction causing severe rash involving mucus membranes or skin necrosis: No Has patient had a PCN reaction that required hospitalization No Has patient had a PCN reaction occurring within the last 10 years: Yes If all of the above answers are "NO", then may proceed with Cephalosporin use.  . Sulfa Antibiotics Anaphylaxis  . Tomato Shortness Of Breath  . Vancomycin Shortness Of Breath  . Flagyl [Metronidazole] Itching  . Milk-Related Compounds   . Toradol [Ketorolac Tromethamine] Nausea And Vomiting    itching  . Tramadol Itching and Nausea And Vomiting      Outpatient Medications Prior to Visit  Medication Sig Dispense Refill  . albuterol (VENTOLIN HFA) 108 (90 Base) MCG/ACT inhaler INHALE 2 PUFFS INTO THE LUNGS EVERY 6 HOURS AS NEEDED FOR WHEEZING OR SHORTNESS OF BREATH (Patient taking differently: Inhale 2 puffs into the lungs every 6 (six) hours as needed for wheezing or shortness of breath. ) 36 g 3  . ALPRAZolam (XANAX) 0.5 MG tablet TAKE 1 TABLET BY MOUTH AT BEDTIME (Patient taking differently: Take 0.5 mg by mouth at bedtime. ) 30 tablet 0  . aspirin EC 81 MG tablet Take 81 mg by mouth every Monday, Wednesday, and Friday.     [name]  ciprofloxacin (CIPRO) 500 MG tablet Take 1 tablet (500 mg total) by mouth 2 (two) times daily for 7 days. 14 tablet 0  . diclofenac Sodium (VOLTAREN) 1 % GEL APPLY 2 GRAMS EXTERNALLY TO THE AFFECTED AREA TWICE DAILY AS NEEDED FOR PAIN 100 g 0  . EPINEPHrine 0.3 mg/0.3 mL IJ SOAJ injection Inject 0.3 mLs (0.3 mg total) into the muscle as needed for anaphylaxis. 1 each 3  . famotidine (PEPCID) 20 MG tablet Take 1 tablet (20 mg total) by  mouth 2 (two) times daily. 10 tablet 0  . gabapentin (NEURONTIN) 300 MG capsule Take 1 capsule (300 mg total) by mouth 4 (four) times daily as needed (pain). 120 capsule 11  . hydrOXYzine (ATARAX/VISTARIL) 25 MG tablet TAKE 1 TABLET(25 MG) BY MOUTH EVERY NIGHT (Patient taking differently: Take 25 mg by mouth at bedtime. ) 90 tablet 3  . ipratropium (ATROVENT) 0.02 % nebulizer solution USE 2.5 ML(0.5 MG) VIA NEBULIZER THREE TIMES DAILY AS NEEDED FOR WHEEZING OR SHORTNESS OF BREATH (Patient taking differently: Take 0.5 mg by nebulization 3 (three) times daily as needed for wheezing or shortness of breath. ) 1650 mL 1  . l-methylfolate-B6-B12 (METANX) 3-35-2 MG TABS tablet Take 1 tablet by mouth daily. 90 tablet 0  . meclizine (ANTIVERT) 25 MG tablet Take 1 tablet by mouth 2 (two) times daily.    . meloxicam (MOBIC) 15 MG tablet Take 1 tablet by mouth daily.    . metoprolol succinate (TOPROL-XL) 25 MG 24 hr tablet Take 25 mg by mouth daily.     . mometasone-formoterol (DULERA) 200-5 MCG/ACT AERO Take 2 puffs first thing in am and then another 2 puffs about 12 hours later. (Patient taking differently: Inhale 2 puffs into the lungs 2 (two) times daily. ) 1 Inhaler 0  . montelukast (SINGULAIR) 10 MG tablet TAKE 1 TABLET(10 MG) BY MOUTH AT BEDTIME 90 tablet 1  . Multiple Vitamin (MULTIVITAMIN WITH MINERALS) TABS tablet Take 1 tablet by mouth daily after breakfast.     . naproxen (NAPROSYN) 500 MG tablet Take 1 tablet by mouth 2 (two) times daily.    [name] omeprazole (PRILOSEC) 40 MG capsule TAKE 1 CAPSULE(40 MG) BY MOUTH EVERY MORNING 30 capsule 3  . ondansetron (ZOFRAN ODT) 8 MG disintegrating tablet Take 1 tablet (8 mg total) by mouth every 8 (eight) hours as needed for nausea or vomiting. 60 tablet 3  . Polyethyl Glycol-Propyl Glycol (SYSTANE) 0.4-0.3 % GEL ophthalmic gel Place 1 application into both eyes 2 (two) times daily.     . predniSONE (STERAPRED UNI-PAK 21 TAB) 10 MG (21) TBPK tablet Take by mouth  daily. Take 6 tabs by mouth daily for 1 day, then 5 tabs for 1 day, then 4 tabs for 1 day, then 3 tabs for 1 day, 2 tabs for 1 day, then 1 tab by mouth daily for 1 day 21 tablet 0  . SUMAtriptan (IMITREX) 100 MG tablet TAKE 1 TABLET BY MOUTH EVERY 2 HOURS AS NEEDED FOR MIGRAINE, MAY REPEAT IN 2 HOURS IF HEADACHE PERSISTS OR RECURS 10 tablet 2  . tizanidine (ZANAFLEX) 6 MG capsule Take 1 capsule (6 mg total) by mouth 3 (three) times daily. 90 capsule 11  . torsemide (DEMADEX) 20 MG tablet Take 20 mg by mouth daily.    . ISENTRESS 400 MG tablet TAKE 2 TABLETS(800 MG) BY MOUTH AT BEDTIME 60 tablet 2  . TRUVADA 200-300 MG tablet TAKE 1 TABLET BY MOUTH DAILY 90 tablet 0  No facility-administered medications prior to visit.     Past Medical History:  Diagnosis Date  . Arthritis    r knee   . Asthma    very rare  . Axillary lymphadenopathy   . Bronchitis   . Chronic anemia    normocytic  . Chronic folliculitis   . Elevated PSA   . Environmental and seasonal allergies   . GERD (gastroesophageal reflux disease)    Patient denies  . Gross hematuria   . H/O pericarditis    2010--  myopercarditis--  resolved  . HCAP (healthcare-associated pneumonia) 11/19/2014  . Headache(784.0)    HX SEVERE FRONTAL HA'S  . Hiatal hernia   . History of concussion    2012  &  2013  RESIDUAL HA'S --  RESOLVED  . History of gastric ulcer   . History of kidney stones   . History of MRSA infection 2010   infected boil  . HIV (human immunodeficiency virus infection) ([place]) 1988  . Hypertension   . Internal hemorrhoids   . Lytic bone lesion of hip    WORK-UP DONE BY ONCOLOGIST DR HA --  NOT MALIGNANT  . 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 Pneumonia    hx of  . Post concussion syndrome    resolved  . Prostate cancer ([place]) 04/25/13   gleason 3+3=6, volume 30 gm  . Sinusitis, chronic 05/14/2015  . Ulcer    hx of gastric  . Wears glasses      Past Surgical History:  Procedure Laterality Date   . CARDIAC CATHETERIZATION  03-23-2009  DR COOPER   NORMAL CORONARY ARTERIES  . CHOLECYSTECTOMY N/A 11/06/2014   Procedure: LAPAROSCOPIC CHOLECYSTECTOMY WITH INTRAOPERATIVE CHOLANGIOGRAM;  Surgeon: Judeth Horn, MD;  Location: [place];  Service: General;  Laterality: N/A;  . COLONOSCOPY  12/26/2011   Procedure: COLONOSCOPY;  Surgeon: Lear Ng, MD;  Location: [facility];  Service: Endoscopy;  Laterality: N/A;  . COLONOSCOPY    . COLONOSCOPY WITH PROPOFOL N/A 07/29/2014   Procedure: COLONOSCOPY WITH PROPOFOL;  Surgeon: Lear Ng, MD;  Location: [place];  Service: Endoscopy;  Laterality: N/A;  . CYSTOSCOPY N/A 05/18/2018   Procedure: CYSTOSCOPY REMOVAL FOREIGN BODY;  Surgeon: Kathie Rhodes, MD;  Location: [facility];  Service: Urology;  Laterality: N/A;  . CYSTOSCOPY/RETROGRADE/URETEROSCOPY Bilateral 04/25/2013   Procedure: CYSTOSCOPY/ BILATERAL RETROGRADES; BLADDER BIOPSIES;  Surgeon: Alexis Frock, MD;  Location: [facility];  Service: Urology;  Laterality: Bilateral;  . DENTAL EXAMINATION UNDER ANESTHESIA    . ESOPHAGOGASTRODUODENOSCOPY  12/26/2011   Procedure: ESOPHAGOGASTRODUODENOSCOPY (EGD);  Surgeon: Lear Ng, MD;  Location: [facility];  Service: Endoscopy;  Laterality: N/A;  . ESOPHAGOGASTRODUODENOSCOPY (EGD) WITH PROPOFOL N/A 07/29/2014   Procedure: ESOPHAGOGASTRODUODENOSCOPY (EGD) WITH PROPOFOL;  Surgeon: Lear Ng, MD;  Location: [place];  Service: Endoscopy;  Laterality: N/A;  . EXCISION CHRONIC LEFT BREAST ABSCESS  09-21-2010  . EXCISIONAL BX LEFT BREAST MASS/  I  &  D LEFT BREAST ABSCESS  03-24-2009  . KNEE ARTHROSCOPY Right 1985  . left axilla biopsy  04/2013  . left hip biopsy  10/2012  . LYMPHADENECTOMY Bilateral 08/18/2013   Procedure: LYMPHADENECTOMY "PELVIC LYMPH NODE DISSECTION";  Surgeon: Alexis Frock, MD;  Location: [facility];  Service: Urology;  Laterality: Bilateral;  . PROSTATE BIOPSY N/A 04/25/2013   Procedure:  BIOPSY TRANSRECTAL ULTRASONIC PROSTATE (TUBP);  Surgeon: Alexis Frock, MD;  [facility];  Service: Urology;  Laterality: N/A;  . ROBOT  ASSISTED LAPAROSCOPIC RADICAL PROSTATECTOMY N/A 08/18/2013   Procedure: ROBOTIC ASSISTED LAPAROSCOPIC RADICAL PROSTATECTOMY;  Surgeon: Alexis Frock, MD;  Location: [facility];  Service: Urology;  Laterality: N/A;  . TOTAL KNEE ARTHROPLASTY Right 08/01/2016   08/17/16  . TOTAL KNEE ARTHROPLASTY Right 08/01/2016   Procedure: TOTAL KNEE ARTHROPLASTY;  Surgeon: Renette Butters, MD;  Location: [place];  Service: Orthopedics;  Laterality: Right;  . UPPER GASTROINTESTINAL ENDOSCOPY    . UPPER LEG SOFT TISSUE BIOPSY Left 2012   lthigh       Review of Systems  Constitutional: Negative for appetite change, chills, fatigue, fever and unexpected weight change.  Eyes: Negative for visual disturbance.  Respiratory: Negative for cough, chest tightness, shortness of breath and wheezing.   Cardiovascular: Negative for chest pain and leg swelling.  Gastrointestinal: Negative for abdominal pain, constipation, diarrhea, nausea and vomiting.  Genitourinary: Positive for flank pain. Negative for dysuria, frequency, genital sores, hematuria and urgency.  Skin: Negative for rash.  Allergic/Immunologic: Negative for immunocompromised state.  Neurological: Negative for dizziness and headaches.      Objective:    BP (!) 151/96   Pulse 76   Temp 98 F (36.7 C)   Ht 5\' 9"  (1.753 m)   Wt 213 lb (96.6 kg)   BMI 31.45 kg/m  Nursing note and vital signs reviewed.  Physical Exam Constitutional:      General: He is not in acute distress.    Appearance: He is well-developed.  Eyes:     Conjunctiva/sclera: Conjunctivae normal.  Cardiovascular:     Rate and Rhythm: Normal rate and regular rhythm.     Heart sounds: Normal heart sounds. No murmur. No friction rub. No gallop.   Pulmonary:     Effort: Pulmonary effort is normal. No respiratory distress.      Breath sounds: Normal breath sounds. No wheezing or rales.  Chest:     Chest wall: No tenderness.  Abdominal:     General: Bowel sounds are normal.     Palpations: Abdomen is soft.     Tenderness: There is no abdominal tenderness. There is right CVA tenderness.  Musculoskeletal:     Cervical back: Neck supple.  Lymphadenopathy:     Cervical: No cervical adenopathy.  Skin:    General: Skin is warm and dry.     Findings: No rash.  Neurological:     Mental Status: He is alert and oriented to person, place, and time.  Psychiatric:        Behavior: Behavior normal.        Thought Content: Thought content normal.        Judgment: Judgment normal.      Depression screen [facility] 2/9 07/07/2019 06/06/2017 11/02/2016 01/11/2016 10/25/2015  Decreased Interest 0 0 0 0 0  Down, Depressed, Hopeless 0 0 0 0 0  PHQ - 2 Score 0 0 0 0 0  Some recent data might be hidden       Assessment & Plan:    Patient Active Problem List   Diagnosis Date Noted  . Generalized abdominal pain 07/07/2019  . Peripheral neuropathy 06/02/2019  . Healthcare maintenance 01/08/2019  . Tension headache 11/12/2017  . Hepatitis B immune 06/06/2017  . Chronic pain of left wrist 06/06/2017  . Constipation 09/27/2016  . Primary osteoarthritis of right knee 08/01/2016  . Chronic renal insufficiency, stage 2 (mild) 05/22/2016  . Skin lesion of right lower extremity 02/22/2016  . Migraines 02/22/2016  . Low back pain 02/22/2016  .  Osteoarthritis of right knee 01/11/2016  . Kidney stones 10/25/2015  . Sinusitis, chronic 05/14/2015  . Cough 01/18/2015  . GERD (gastroesophageal reflux disease) 07/29/2014  . COPD GOLD III 07/22/2014  . Hx of primary hypertension 04/27/2014  . H/O prostate cancer   . Lytic bone lesion of hip 08/22/2012  . Cataract, nuclear 05/15/2012  . Asthma 01/23/2012  . Insomnia 05/11/2011  . HTN (hypertension) 04/17/2011  . Mood disorder in conditions classified elsewhere 06/14/2009  . Human  immunodeficiency virus (HIV) disease ([place]) 04/19/2009     Problem List Items Addressed This Visit      Other   Human immunodeficiency virus (HIV) disease ([place]) - Primary    Casey Brennan has well-controlled HIV disease with good adherence and tolerance to his ART regimen of Isentress and Truvada.  No signs/symptoms of opportunistic infection or progressive HIV disease.  We reviewed previous lab work and discussed plan of care.  Check blood work today.  Continue current dose of Isentress and Truvada.  Plan for follow-up in 6 months or sooner if needed with lab work on the same day.      Relevant Medications   emtricitabine-tenofovir (TRUVADA) 200-300 MG tablet   ISENTRESS 400 MG tablet   Other Relevant Orders   T-helper cell (CD4)- ([facility] clinic only)   HIV-1 RNA quant-no reflex-bld   Healthcare maintenance     Declines vaccination today.  Discussed importance of safe sexual practice to reduce risk of STI.  Declines condoms.  Colon cancer screening up-to-date per recommendations.      Generalized abdominal pain    Casey Brennan continues to have right-sided flank pain has been refractory to ciprofloxacin with no other urinary symptoms at present.  He has concern for nephrolithiasis.  Will obtain CT scan.  Continue current dose of ciprofloxacin until complete.  May need referral to urology if symptoms continue.      Relevant Orders   CT RENAL STONE STUDY       I have changed Casey Brennan "Tony"'s Truvada to emtricitabine-tenofovir. I am also having him maintain his aspirin EC, multivitamin with minerals, Polyethyl Glycol-Propyl Glycol, ipratropium, ondansetron, metoprolol succinate, torsemide, l-methylfolate-B6-B12, naproxen, meloxicam, meclizine, mometasone-formoterol, gabapentin, tizanidine, hydrOXYzine, albuterol, ALPRAZolam, EPINEPHrine, predniSONE, famotidine, omeprazole, SUMAtriptan, diclofenac Sodium, montelukast, ciprofloxacin, and Isentress.   Meds ordered this encounter   Medications  . emtricitabine-tenofovir (TRUVADA) 200-300 MG tablet    Sig: Take 1 tablet by mouth daily.    Dispense:  30 tablet    Refill:  5    Order Specific Question:   Supervising Provider    Answer:   Carlyle Basques [[id number]]  . ISENTRESS 400 MG tablet    Sig: TAKE 2 TABLETS(800 MG) BY MOUTH AT BEDTIME    Dispense:  60 tablet    Refill:  5    Order Specific Question:   Supervising Provider    Answer:   Carlyle Basques [id number]     Follow-up: Return in about 6 months (around 01/04/2020), or if symptoms worsen or fail to improve.   Terri Piedra, MSN, FNP-C Nurse Practitioner [facility] for Infectious [facility] [place] number: [phone]

## 2019-07-07 NOTE — Patient Instructions (Addendum)
Nice to see you.  We will check your blood work today.  A CT scan to check for kidney stones has been ordered.  Refills of your medication have been sent to the pharmacy.   Plan for follow up in 6 months or sooner if needed with lab work on the same day.  Have a great day and stay safe.  Happy New Year!

## 2019-07-07 NOTE — Assessment & Plan Note (Signed)
Casey Brennan has well-controlled HIV disease with good adherence and tolerance to his ART regimen of Isentress and Truvada.  No signs/symptoms of opportunistic infection or progressive HIV disease.  We reviewed previous lab work and discussed plan of care.  Check blood work today.  Continue current dose of Isentress and Truvada.  Plan for follow-up in 6 months or sooner if needed with lab work on the same day.

## 2019-07-08 LAB — T-HELPER CELL (CD4) - (RCID CLINIC ONLY)
CD4 % Helper T Cell: 26 % — ABNORMAL LOW (ref 33–65)
CD4 T Cell Abs: 580 /uL (ref 400–1790)

## 2019-07-11 LAB — HIV-1 RNA QUANT-NO REFLEX-BLD
HIV 1 RNA Quant: <20 copies/mL
HIV-1 RNA Quant, Log: <1.3 Log copies/mL

## 2019-07-15 ENCOUNTER — Other Ambulatory Visit: Payer: Medicare Other

## 2019-07-15 ENCOUNTER — Other Ambulatory Visit: Payer: Self-pay

## 2019-07-15 ENCOUNTER — Ambulatory Visit
Admission: RE | Admit: 2019-07-15 | Discharge: 2019-07-15 | Disposition: A | Discharge status: Home / Self Care | Payer: Medicare Other | Source: Ambulatory Visit | Attending: Family | Admitting: Family

## 2019-07-15 DIAGNOSIS — R1084 Generalized abdominal pain: Secondary | ICD-10-CM

## 2019-07-21 ENCOUNTER — Other Ambulatory Visit: Payer: Self-pay | Admitting: Primary Care

## 2019-07-29 ENCOUNTER — Other Ambulatory Visit: Payer: Self-pay | Admitting: Infectious Diseases

## 2019-07-29 DIAGNOSIS — J449 Chronic obstructive pulmonary disease, unspecified: Secondary | ICD-10-CM

## 2019-08-11 ENCOUNTER — Other Ambulatory Visit: Payer: Self-pay | Admitting: Infectious Diseases

## 2019-08-11 ENCOUNTER — Other Ambulatory Visit: Payer: Self-pay | Admitting: Neurology

## 2019-08-11 DIAGNOSIS — B20 Human immunodeficiency virus [HIV] disease: Secondary | ICD-10-CM

## 2019-08-11 DIAGNOSIS — G4700 Insomnia, unspecified: Secondary | ICD-10-CM

## 2019-08-11 MED ORDER — ALPRAZOLAM 0.5 MG PO TABS: ORAL_TABLET | ORAL | 0 refills | Status: DC

## 2019-10-10 ENCOUNTER — Other Ambulatory Visit: Payer: Self-pay | Admitting: *Deleted

## 2019-10-10 ENCOUNTER — Other Ambulatory Visit: Payer: Self-pay | Admitting: Infectious Diseases

## 2019-10-10 DIAGNOSIS — B20 Human immunodeficiency virus [HIV] disease: Secondary | ICD-10-CM

## 2019-10-10 MED ORDER — TRUVADA 200-300 MG PO TABS: 1.0000 | ORAL_TABLET | Freq: Every day | ORAL | 5 refills | Status: DC

## 2019-10-10 MED ORDER — ISENTRESS 400 MG PO TABS: ORAL_TABLET | ORAL | 5 refills | Status: DC

## 2019-10-10 NOTE — Progress Notes (Signed)
Called [facility] to confirm regimen - patient no longer takes Prezista.  Only takes Truvada + Isentress.  RN sent in refills as [facility] did not receive the ones in January 2021. Landis Gandy, RN

## 2019-10-23 ENCOUNTER — Ambulatory Visit: Payer: Medicare Other | Attending: Internal Medicine

## 2019-10-23 DIAGNOSIS — Z23 Encounter for immunization: Secondary | ICD-10-CM

## 2019-10-23 NOTE — Progress Notes (Signed)
   Covid-19 Vaccination Clinic  Name:  TANMAY NIGRELLI    MRN: [id number] DOB: [birth year]  10/23/2019  Mr. Lawton was observed post Covid-19 immunization for 15 minutes without incident. He was provided with Vaccine Information Sheet and instruction to access the V-Safe system.   Mr. Vanorden was instructed to call 911 with any severe reactions post vaccine: [name] Difficulty breathing  . Swelling of face and throat  . A fast heartbeat  . A bad rash all over body  . Dizziness and weakness   Immunizations Administered    Name Date Dose VIS Date Route   Pfizer COVID-19 Vaccine 10/23/2019  9:01 AM 0.3 mL 08/27/2018 Intramuscular   Manufacturer: [place]   Lot: [id number]   [place]: [id number]

## 2019-10-27 ENCOUNTER — Ambulatory Visit (INDEPENDENT_AMBULATORY_CARE_PROVIDER_SITE_OTHER): Payer: Medicare Other | Admitting: Family

## 2019-10-27 ENCOUNTER — Encounter: Payer: Self-pay | Admitting: Family

## 2019-10-27 ENCOUNTER — Other Ambulatory Visit: Payer: Self-pay

## 2019-10-27 VITALS — BP 143/98 | HR 89 | Wt 218.0 lb

## 2019-10-27 DIAGNOSIS — B20 Human immunodeficiency virus [HIV] disease: Secondary | ICD-10-CM

## 2019-10-27 DIAGNOSIS — Z0000 Encounter for general adult medical examination without abnormal findings: Secondary | ICD-10-CM

## 2019-10-27 MED ORDER — TRUVADA 200-300 MG PO TABS: 1.0000 | ORAL_TABLET | Freq: Every day | ORAL | 5 refills | Status: DC

## 2019-10-27 MED ORDER — ISENTRESS 400 MG PO TABS: ORAL_TABLET | ORAL | 5 refills | Status: DC

## 2019-10-27 NOTE — Assessment & Plan Note (Signed)
Casey Brennan continues to have adequately controlled HIV disease with good adherence and tolerance to his ART regimen of Isentress and Truvada.  No signs/symptoms of opportunistic infection or progressive HIV disease.  We reviewed his previous lab work and discussed the plan of care check blood work today.  Continue current dose of Isentress and Truvada.  Plan for follow-up in 4 months or sooner if needed with lab work on the same day.

## 2019-10-27 NOTE — Progress Notes (Signed)
Subjective:    Patient ID: Casey Brennan, male    DOB: Sep 14, 1960, 59 y.o.   MRN: [id number]  Chief Complaint  Patient presents with  . Follow-up    increased nausea; states unable to smell foods without feeling sick; recent allergy test and reports being allergic to "almost everything";      HPI:  Casey Brennan is a 59 y.o. male with HIV disease who was last seen in the office on 07/07/2019 with good adherence and tolerance to his ART regimen of Isentress and Truvada.  Lab work at the time showed a viral load that was undetectable with CD4 count of 580.  No recent blood work completed.  Casey Brennan continues to take his Isentress and Truvada with no adverse side effects or missed doses since his last office visit.  He is currently being followed by allergy with concern for allergies to "almost everything".  Otherwise he is feeling well. Denies fevers, chills, night sweats, headaches, changes in vision, neck pain/stiffness, diarrhea, vomiting, lesions or rashes.   Casey Brennan has no problems obtaining his medication from the pharmacy remains covered [place] [facility].  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  Denies feelings of being down, depressed, or hopeless recently.  No recreational or illicit drug use, tobacco use, or alcohol consumption.  Declines condoms today.  Would like referral to new cardiologist.  Allergies  Allergen Reactions  . Chocolate Shortness Of Breath and Other (See Comments)    Asthma attack, welts  . Descovy [Emtricitabine-Tenofovir Af] Shortness Of Breath    Nasuea, vomiiting, diarrhea, sob, whelps, throat closes  . Genvoya [Elviteg-Cobic-Emtricit-Tenofaf] Shortness Of Breath    Nausea, vomitting, diarreha, sob, rash, throat closing  . Morphine And Related Shortness Of Breath, Itching and Other (See Comments)     chest pain  . Penicillins Shortness Of Breath and Rash    Has patient had a PCN reaction causing immediate rash,  facial/tongue/throat swelling, SOB or lightheadedness with hypotension: Yes Has patient had a PCN reaction causing severe rash involving mucus membranes or skin necrosis: No Has patient had a PCN reaction that required hospitalization No Has patient had a PCN reaction occurring within the last 10 years: Yes If all of the above answers are "NO", then may proceed with Cephalosporin use.  . Sulfa Antibiotics Anaphylaxis  . Tomato Shortness Of Breath  . Vancomycin Shortness Of Breath  . Flagyl [Metronidazole] Itching  . Milk-Related Compounds   . Toradol [Ketorolac Tromethamine] Nausea And Vomiting    itching  . Tramadol Itching and Nausea And Vomiting      Outpatient Medications Prior to Visit  Medication Sig Dispense Refill  . ISENTRESS 400 MG tablet TAKE 2 TABLETS(800 MG) BY MOUTH AT BEDTIME 60 tablet 5  . TRUVADA 200-300 MG tablet Take 1 tablet by mouth daily. 30 tablet 5  . albuterol (VENTOLIN HFA) 108 (90 Base) MCG/ACT inhaler INHALE 2 PUFFS INTO THE LUNGS EVERY 6 HOURS AS NEEDED FOR WHEEZING OR SHORTNESS OF BREATH 36 g 3  . ALPRAZolam (XANAX) 0.5 MG tablet TAKE 1 TABLET BY MOUTH AT BEDTIME 90 tablet 0  . aspirin EC 81 MG tablet Take 81 mg by mouth every Monday, Wednesday, and Friday.     . diclofenac Sodium (VOLTAREN) 1 % GEL APPLY 2 GRAMS EXTERNALLY TO THE AFFECTED AREA TWICE DAILY AS NEEDED FOR PAIN 100 g 0  . EPINEPHrine 0.3 mg/0.3 mL IJ SOAJ injection Inject 0.3 mLs (0.3 mg total) into  the muscle as needed for anaphylaxis. 1 each 3  . famotidine (PEPCID) 20 MG tablet Take 1 tablet (20 mg total) by mouth 2 (two) times daily. 10 tablet 0  . gabapentin (NEURONTIN) 300 MG capsule Take 1 capsule (300 mg total) by mouth 4 (four) times daily as needed (pain). 120 capsule 11  . hydrOXYzine (ATARAX/VISTARIL) 25 MG tablet TAKE 1 TABLET(25 MG) BY MOUTH EVERY NIGHT (Patient taking differently: Take 25 mg by mouth at bedtime. ) 90 tablet 3  . ipratropium (ATROVENT) 0.02 % nebulizer solution  USE 2.5 ML(0.5 MG) VIA NEBULIZER THREE TIMES DAILY AS NEEDED FOR WHEEZING OR SHORTNESS OF BREATH (Patient taking differently: Take 0.5 mg by nebulization 3 (three) times daily as needed for wheezing or shortness of breath. ) 1650 mL 1  . l-methylfolate-B6-B12 (METANX) 3-35-2 MG TABS tablet Take 1 tablet by mouth daily. 90 tablet 0  . meclizine (ANTIVERT) 25 MG tablet Take 1 tablet by mouth 2 (two) times daily.    . meloxicam (MOBIC) 15 MG tablet Take 1 tablet by mouth daily.    . metoprolol succinate (TOPROL-XL) 25 MG 24 hr tablet Take 25 mg by mouth daily.     . mometasone-formoterol (DULERA) 200-5 MCG/ACT AERO INHALE 2 PUFFS BY MOUTH EVERY MORNING AND ANOTHER 2 PUFFS 12 HOURS LATER 13 g 0  . montelukast (SINGULAIR) 10 MG tablet TAKE 1 TABLET(10 MG) BY MOUTH AT BEDTIME 90 tablet 1  . Multiple Vitamin (MULTIVITAMIN WITH MINERALS) TABS tablet Take 1 tablet by mouth daily after breakfast.     . naproxen (NAPROSYN) 500 MG tablet Take 1 tablet by mouth 2 (two) times daily.    [name] omeprazole (PRILOSEC) 40 MG capsule TAKE 1 CAPSULE(40 MG) BY MOUTH EVERY MORNING 30 capsule 3  . ondansetron (ZOFRAN ODT) 8 MG disintegrating tablet Take 1 tablet (8 mg total) by mouth every 8 (eight) hours as needed for nausea or vomiting. 60 tablet 3  . Polyethyl Glycol-Propyl Glycol (SYSTANE) 0.4-0.3 % GEL ophthalmic gel Place 1 application into both eyes 2 (two) times daily.     . predniSONE (STERAPRED UNI-PAK 21 TAB) 10 MG (21) TBPK tablet Take by mouth daily. Take 6 tabs by mouth daily for 1 day, then 5 tabs for 1 day, then 4 tabs for 1 day, then 3 tabs for 1 day, 2 tabs for 1 day, then 1 tab by mouth daily for 1 day 21 tablet 0  . SUMAtriptan (IMITREX) 100 MG tablet TAKE 1 TABLET BY MOUTH EVERY 2 HOURS AS NEEDED FOR MIGRAINE, MAY REPEAT IN 2 HOURS IF HEADACHE PERSISTS OR RECURS 10 tablet 2  . tizanidine (ZANAFLEX) 6 MG capsule Take 1 capsule (6 mg total) by mouth 3 (three) times daily. 90 capsule 11  . torsemide (DEMADEX) 20  MG tablet Take 20 mg by mouth daily.     No facility-administered medications prior to visit.     Past Medical History:  Diagnosis Date  . Arthritis    r knee   . Asthma    very rare  . Axillary lymphadenopathy   . Bronchitis   . Chronic anemia    normocytic  . Chronic folliculitis   . Elevated PSA   . Environmental and seasonal allergies   . GERD (gastroesophageal reflux disease)    Patient denies  . Gross hematuria   . H/O pericarditis    2010--  myopercarditis--  resolved  . HCAP (healthcare-associated pneumonia) 11/19/2014  . Headache(784.0)    HX SEVERE FRONTAL HA'S  .  Hiatal hernia   . History of concussion    2012  &  2013  RESIDUAL HA'S --  RESOLVED  . History of gastric ulcer   . History of kidney stones   . History of MRSA infection 2010   infected boil  . HIV (human immunodeficiency virus infection) ([place]) 1988  . Hypertension   . Internal hemorrhoids   . Lytic bone lesion of hip    WORK-UP DONE BY ONCOLOGIST DR HA --  NOT MALIGNANT  . 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 Pneumonia    hx of  . Post concussion syndrome    resolved  . Prostate cancer ([place]) 04/25/13   gleason 3+3=6, volume 30 gm  . Sinusitis, chronic 05/14/2015  . Ulcer    hx of gastric  . Wears glasses      Past Surgical History:  Procedure Laterality Date  . CARDIAC CATHETERIZATION  03-23-2009  DR COOPER   NORMAL CORONARY ARTERIES  . CHOLECYSTECTOMY N/A 11/06/2014   Procedure: LAPAROSCOPIC CHOLECYSTECTOMY WITH INTRAOPERATIVE CHOLANGIOGRAM;  Surgeon: Judeth Horn, MD;  Location: [facility];  Service: General;  Laterality: N/A;  . COLONOSCOPY  12/26/2011   Procedure: COLONOSCOPY;  Surgeon: Lear Ng, MD;  Location: [facility];  Service: Endoscopy;  Laterality: N/A;  . COLONOSCOPY    . COLONOSCOPY WITH PROPOFOL N/A 07/29/2014   Procedure: COLONOSCOPY WITH PROPOFOL;  Surgeon: Lear Ng, MD;  Location: [place];  Service: Endoscopy;  Laterality: N/A;  . CYSTOSCOPY  N/A 05/18/2018   Procedure: CYSTOSCOPY REMOVAL FOREIGN BODY;  Surgeon: Kathie Rhodes, MD;  Location: [facility];  Service: Urology;  Laterality: N/A;  . CYSTOSCOPY/RETROGRADE/URETEROSCOPY Bilateral 04/25/2013   Procedure: CYSTOSCOPY/ BILATERAL RETROGRADES; BLADDER BIOPSIES;  Surgeon: Alexis Frock, MD;  Location: [facility];  Service: Urology;  Laterality: Bilateral;  . DENTAL EXAMINATION UNDER ANESTHESIA    . ESOPHAGOGASTRODUODENOSCOPY  12/26/2011   Procedure: ESOPHAGOGASTRODUODENOSCOPY (EGD);  Surgeon: Lear Ng, MD;  Location: [facility];  Service: Endoscopy;  Laterality: N/A;  . ESOPHAGOGASTRODUODENOSCOPY (EGD) WITH PROPOFOL N/A 07/29/2014   Procedure: ESOPHAGOGASTRODUODENOSCOPY (EGD) WITH PROPOFOL;  Surgeon: Lear Ng, MD;  Location: [place];  Service: Endoscopy;  Laterality: N/A;  . EXCISION CHRONIC LEFT BREAST ABSCESS  09-21-2010  . EXCISIONAL BX LEFT BREAST MASS/  I  &  D LEFT BREAST ABSCESS  03-24-2009  . KNEE ARTHROSCOPY Right 1985  . left axilla biopsy  04/2013  . left hip biopsy  10/2012  . LYMPHADENECTOMY Bilateral 08/18/2013   Procedure: LYMPHADENECTOMY "PELVIC LYMPH NODE DISSECTION";  Surgeon: Alexis Frock, MD;  Location: [facility];  Service: Urology;  Laterality: Bilateral;  . PROSTATE BIOPSY N/A 04/25/2013   Procedure: BIOPSY TRANSRECTAL ULTRASONIC PROSTATE (TUBP);  Surgeon: Alexis Frock, MD;  [facility];  Service: Urology;  Laterality: N/A;  . ROBOT ASSISTED LAPAROSCOPIC RADICAL PROSTATECTOMY N/A 08/18/2013   Procedure: ROBOTIC ASSISTED LAPAROSCOPIC RADICAL PROSTATECTOMY;  Surgeon: Alexis Frock, MD;  Location: [facility];  Service: Urology;  Laterality: N/A;  . TOTAL KNEE ARTHROPLASTY Right 08/01/2016   08/17/16  . TOTAL KNEE ARTHROPLASTY Right 08/01/2016   Procedure: TOTAL KNEE ARTHROPLASTY;  Surgeon: Renette Butters, MD;  Location: [place];  Service: Orthopedics;  Laterality: Right;  . UPPER GASTROINTESTINAL  ENDOSCOPY    . UPPER LEG SOFT TISSUE BIOPSY Left 2012   lthigh       Review of Systems  Constitutional: Negative for appetite change, chills, fatigue, fever and unexpected weight change.  Eyes: Negative for  visual disturbance.  Respiratory: Negative for cough, chest tightness, shortness of breath and wheezing.   Cardiovascular: Negative for chest pain and leg swelling.  Gastrointestinal: Negative for abdominal pain, constipation, diarrhea, nausea and vomiting.  Genitourinary: Negative for dysuria, flank pain, frequency, genital sores, hematuria and urgency.  Skin: Negative for rash.  Allergic/Immunologic: Negative for immunocompromised state.  Neurological: Negative for dizziness and headaches.      Objective:    BP (!) 143/98   Pulse 89   Wt 218 lb (98.9 kg)   BMI 32.19 kg/m  Nursing note and vital signs reviewed.  Physical Exam Constitutional:      General: He is not in acute distress.    Appearance: He is well-developed.  Eyes:     Conjunctiva/sclera: Conjunctivae normal.  Cardiovascular:     Rate and Rhythm: Normal rate and regular rhythm.     Heart sounds: Normal heart sounds. No murmur. No friction rub. No gallop.   Pulmonary:     Effort: Pulmonary effort is normal. No respiratory distress.     Breath sounds: Normal breath sounds. No wheezing or rales.  Chest:     Chest wall: No tenderness.  Abdominal:     General: Bowel sounds are normal.     Palpations: Abdomen is soft.     Tenderness: There is no abdominal tenderness.  Musculoskeletal:     Cervical back: Neck supple.  Lymphadenopathy:     Cervical: No cervical adenopathy.  Skin:    General: Skin is warm and dry.     Findings: No rash.  Neurological:     Mental Status: He is alert and oriented to person, place, and time.  Psychiatric:        Behavior: Behavior normal.        Thought Content: Thought content normal.        Judgment: Judgment normal.      Depression screen [facility] 2/9 10/27/2019  07/07/2019 06/06/2017 11/02/2016 01/11/2016  Decreased Interest 0 0 0 0 0  Down, Depressed, Hopeless 0 0 0 0 0  PHQ - 2 Score 0 0 0 0 0  Some recent data might be hidden       Assessment & Plan:    Patient Active Problem List   Diagnosis Date Noted  . Generalized abdominal pain 07/07/2019  . Peripheral neuropathy 06/02/2019  . Healthcare maintenance 01/08/2019  . Tension headache 11/12/2017  . Hepatitis B immune 06/06/2017  . Chronic pain of left wrist 06/06/2017  . Constipation 09/27/2016  . Primary osteoarthritis of right knee 08/01/2016  . Chronic renal insufficiency, stage 2 (mild) 05/22/2016  . Skin lesion of right lower extremity 02/22/2016  . Migraines 02/22/2016  . Low back pain 02/22/2016  . Osteoarthritis of right knee 01/11/2016  . Kidney stones 10/25/2015  . Sinusitis, chronic 05/14/2015  . Cough 01/18/2015  . GERD (gastroesophageal reflux disease) 07/29/2014  . COPD GOLD III 07/22/2014  . Hx of primary hypertension 04/27/2014  . H/O prostate cancer   . Lytic bone lesion of hip 08/22/2012  . Cataract, nuclear 05/15/2012  . Asthma 01/23/2012  . Insomnia 05/11/2011  . HTN (hypertension) 04/17/2011  . Mood disorder in conditions classified elsewhere 06/14/2009  . Human immunodeficiency virus (HIV) disease ([place]) 04/19/2009     Problem List Items Addressed This Visit      Other   Human immunodeficiency virus (HIV) disease ([place])    Mr. Zaccagnini continues to have adequately controlled HIV disease with good adherence and tolerance to his ART  regimen of Isentress and Truvada.  No signs/symptoms of opportunistic infection or progressive HIV disease.  We reviewed his previous lab work and discussed the plan of care check blood work today.  Continue current dose of Isentress and Truvada.  Plan for follow-up in 4 months or sooner if needed with lab work on the same day.      Relevant Medications   TRUVADA 200-300 MG tablet   ISENTRESS 400 MG tablet   Healthcare maintenance      First Covid vaccination received 4 days ago.  Hold all other immunizations until series complete.    Discussed importance of safe sexual practice to reduce risk of STI.  Condoms declined.          I am having Izekiel A. Cihlar "[name]" maintain his aspirin EC, multivitamin with minerals, Polyethyl Glycol-Propyl Glycol, ipratropium, ondansetron, metoprolol succinate, torsemide, l-methylfolate-B6-B12, naproxen, meloxicam, meclizine, gabapentin, tizanidine, hydrOXYzine, EPINEPHrine, predniSONE, famotidine, omeprazole, SUMAtriptan, diclofenac Sodium, montelukast, Dulera, albuterol, ALPRAZolam, Truvada, and Isentress.   Meds ordered this encounter  Medications  . TRUVADA 200-300 MG tablet    Sig: Take 1 tablet by mouth daily.    Dispense:  30 tablet    Refill:  5    Order Specific Question:   Supervising Provider    Answer:   Carlyle Basques [[id number]]  . ISENTRESS 400 MG tablet    Sig: TAKE 2 TABLETS(800 MG) BY MOUTH AT BEDTIME    Dispense:  60 tablet    Refill:  5    Order Specific Question:   Supervising Provider    Answer:   Carlyle Basques [id number]     Follow-up: Return in about 6 months (around 04/27/2020), or if symptoms worsen or fail to improve.   Terri Piedra, MSN, FNP-C Nurse Practitioner [facility] [place] number: [phone]

## 2019-10-27 NOTE — Patient Instructions (Addendum)
Nice to see you.  We will continue your current dose of Isentress and Truvada  Refills have been sent to the pharmacy.   Plan for follow up in 6 months or sooner if needed with lab work 1-2 weeks prior to appointment.

## 2019-10-27 NOTE — Assessment & Plan Note (Signed)
   First Covid vaccination received 4 days ago.  Hold all other immunizations until series complete.    Discussed importance of safe sexual practice to reduce risk of STI.  Condoms declined.

## 2019-11-12 ENCOUNTER — Other Ambulatory Visit: Payer: Self-pay | Admitting: Infectious Diseases

## 2019-11-12 DIAGNOSIS — B20 Human immunodeficiency virus [HIV] disease: Secondary | ICD-10-CM

## 2019-11-13 ENCOUNTER — Ambulatory Visit: Payer: Medicare Other | Attending: Internal Medicine

## 2019-11-13 DIAGNOSIS — Z23 Encounter for immunization: Secondary | ICD-10-CM

## 2019-11-13 NOTE — Progress Notes (Signed)
   Covid-19 Vaccination Clinic  Name:  Casey Brennan    MRN: [id number] DOB: 1960/09/22  11/13/2019  Casey Brennan was observed post Covid-19 immunization for 30 minutes based on pre-vaccination screening without incident. He was provided with Vaccine Information Sheet and instruction to access the V-Safe system.   Casey Brennan was instructed to call 911 with any severe reactions post vaccine: [name] Difficulty breathing  . Swelling of face and throat  . A fast heartbeat  . A bad rash all over body  . Dizziness and weakness   Immunizations Administered    Name Date Dose VIS Date Route   Pfizer COVID-19 Vaccine 11/13/2019 10:01 AM 0.3 mL 08/27/2018 Intramuscular   Manufacturer: [place]   Lot: [id number]   [place]: [id number]

## 2019-11-15 ENCOUNTER — Other Ambulatory Visit: Payer: Self-pay | Admitting: Neurology

## 2019-11-15 ENCOUNTER — Other Ambulatory Visit: Payer: Self-pay | Admitting: Infectious Diseases

## 2019-11-15 DIAGNOSIS — G43009 Migraine without aura, not intractable, without status migrainosus: Secondary | ICD-10-CM

## 2019-11-15 DIAGNOSIS — G4700 Insomnia, unspecified: Secondary | ICD-10-CM

## 2019-11-17 ENCOUNTER — Ambulatory Visit: Payer: Medicare Other

## 2019-11-21 ENCOUNTER — Other Ambulatory Visit: Payer: Self-pay | Admitting: Infectious Diseases

## 2019-11-21 DIAGNOSIS — B20 Human immunodeficiency virus [HIV] disease: Secondary | ICD-10-CM

## 2019-11-21 DIAGNOSIS — J449 Chronic obstructive pulmonary disease, unspecified: Secondary | ICD-10-CM

## 2019-11-27 ENCOUNTER — Telehealth: Payer: Self-pay

## 2019-11-27 NOTE — Telephone Encounter (Signed)
Patient's sister called office today with some concerns she has regarding patient's medication. States patient has been taking Isentress, Truvada, and Prezista.  Patient had additional Prezista on hand and took last tablet today. April states she was unaware that prezista was d/c. Would like for Terri Piedra, FNP to be made aware.  [place]

## 2019-12-18 ENCOUNTER — Other Ambulatory Visit: Payer: Self-pay | Admitting: Internal Medicine

## 2019-12-19 ENCOUNTER — Other Ambulatory Visit: Payer: Self-pay | Admitting: Infectious Diseases

## 2019-12-19 DIAGNOSIS — G43009 Migraine without aura, not intractable, without status migrainosus: Secondary | ICD-10-CM

## 2020-01-06 ENCOUNTER — Other Ambulatory Visit: Payer: Medicare Other

## 2020-01-20 ENCOUNTER — Encounter: Payer: Medicare Other | Admitting: Family

## 2020-01-21 ENCOUNTER — Other Ambulatory Visit: Payer: Self-pay | Admitting: Internal Medicine

## 2020-01-21 ENCOUNTER — Encounter: Payer: Medicare Other | Admitting: Family

## 2020-02-08 ENCOUNTER — Other Ambulatory Visit: Payer: Self-pay | Admitting: Internal Medicine

## 2020-02-09 NOTE — Telephone Encounter (Signed)
Not seen in > one year so ok to refill > ov with me or NP before runs out

## 2020-02-10 ENCOUNTER — Other Ambulatory Visit: Payer: Self-pay | Admitting: *Deleted

## 2020-02-10 DIAGNOSIS — B20 Human immunodeficiency virus [HIV] disease: Secondary | ICD-10-CM

## 2020-02-10 MED ORDER — GABAPENTIN 300 MG PO CAPS: 300.0000 mg | ORAL_CAPSULE | Freq: Four times a day (QID) | ORAL | 11 refills | Status: DC | PRN

## 2020-02-16 ENCOUNTER — Telehealth: Payer: Self-pay

## 2020-02-16 NOTE — Telephone Encounter (Signed)
Thanks everyone

## 2020-02-16 NOTE — Telephone Encounter (Signed)
Per Greg's previous notes, it seems like he should only be on Isentress and Truvada. It is fine that he ran out in July.

## 2020-02-16 NOTE — Telephone Encounter (Signed)
Received call from sister. She states patient is currently not feeling his best. States patient has been having fevers on/off since last Wednesday with nasal drainage. 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 Sister is requesting appointment this Friday or next Monday when patient is back in town. No appointments currently available. Advise family to have patient go get tested for covid at pharmacy or community site, continue to hydrate and take advil (this is what patient has been taking) to reduce fevers. Advised if needed patient can be seen at Urgent Care.   Sister then states patient was last seen in April and sayx patient was never advised during last visit to stop taking Prezista. Per last office visit note ART is now Truvada and Isentress. Per sister patient stopped taking Prezista last month and had been continuing to take prezista, truvada, and isentress.   Routing to provider to confirm ART. Eugenia Mcalpine

## 2020-02-17 NOTE — Telephone Encounter (Signed)
Patient's sister made aware via voicemail to continue only with Isentress and Truvada.  Casey Brennan

## 2020-03-11 ENCOUNTER — Other Ambulatory Visit: Payer: Self-pay

## 2020-03-11 ENCOUNTER — Ambulatory Visit (INDEPENDENT_AMBULATORY_CARE_PROVIDER_SITE_OTHER): Payer: Medicare Other | Admitting: Infectious Diseases

## 2020-03-11 ENCOUNTER — Other Ambulatory Visit (HOSPITAL_COMMUNITY)
Admission: RE | Admit: 2020-03-11 | Discharge: 2020-03-11 | Disposition: A | Discharge status: Home / Self Care | Payer: Medicare Other | Source: Ambulatory Visit | Attending: Infectious Diseases | Admitting: Infectious Diseases

## 2020-03-11 ENCOUNTER — Encounter: Payer: Self-pay | Admitting: Infectious Diseases

## 2020-03-11 VITALS — BP 155/100 | HR 72 | Temp 98.1°F | Ht 69.0 in | Wt 224.0 lb

## 2020-03-11 DIAGNOSIS — Z113 Encounter for screening for infections with a predominantly sexual mode of transmission: Secondary | ICD-10-CM | POA: Diagnosis not present

## 2020-03-11 DIAGNOSIS — N182 Chronic kidney disease, stage 2 (mild): Secondary | ICD-10-CM | POA: Diagnosis not present

## 2020-03-11 DIAGNOSIS — J449 Chronic obstructive pulmonary disease, unspecified: Secondary | ICD-10-CM | POA: Diagnosis not present

## 2020-03-11 DIAGNOSIS — Z8546 Personal history of malignant neoplasm of prostate: Secondary | ICD-10-CM

## 2020-03-11 DIAGNOSIS — Z79899 Other long term (current) drug therapy: Secondary | ICD-10-CM

## 2020-03-11 DIAGNOSIS — B20 Human immunodeficiency virus [HIV] disease: Secondary | ICD-10-CM

## 2020-03-11 DIAGNOSIS — I10 Essential (primary) hypertension: Secondary | ICD-10-CM | POA: Diagnosis not present

## 2020-03-11 DIAGNOSIS — N2889 Other specified disorders of kidney and ureter: Secondary | ICD-10-CM

## 2020-03-11 NOTE — Assessment & Plan Note (Signed)
He appears to be doing well With regard to his fatigue, I encouraged him to take time to himself away from his mom/sister's health issues.  Will continue his current ART.  Refuses flu vax Check labs today Has gotten COVID vax.  rtc in 6 months.

## 2020-03-11 NOTE — Assessment & Plan Note (Signed)
Still has some dyspnea.  Will conte his prn inhalers.  F/u with CCM is greatly appreciated.

## 2020-03-11 NOTE — Assessment & Plan Note (Signed)
Lab Results  Component Value Date   CREATININE 1.36 (H) 05/08/2019   CREATININE 1.16 12/04/2018   CREATININE 1.32 04/24/2018   Will check his labs today.

## 2020-03-11 NOTE — Addendum Note (Signed)
Addended by: Caffie Pinto on: 03/11/2020 03:53 PM   Modules accepted: Orders

## 2020-03-11 NOTE — Progress Notes (Signed)
   Subjective:    Patient ID: Casey Brennan, male    DOB: December 09, 1960, 59 y.o.   MRN: [id number]  HPI 59 yo M with hx HIV+, COPD Gold,and prostate Ca.  Had radical prostatectomy 08-18-13, adenoCA, LN (-), margins (-).  Had R knee arthroscopy 1985,prevMRIand has severe osteoarthritis.  Is on isentress/darunavir/norvir/descovy. Was changed to genvoya, prezista (05-2016). He developed a rash from the sulfa in the darunavir, changed back to his prev rx.  Had R TKR 08-2016 He is on issentress/truvada now alone.  ? Allergy to descovey (more likely darunavir). .  He has been seen by our excellent NP for his last 4 visits.   Has gotten COVID vax.  States he has taken his anti-htn this am.  Sister is having hemmeroid surgery tomorrow. Has had other issues as well.  Continues to work as Arboriculturist.  Feeling well- so-so. Has been feeling nauseated with smelling food. Has been more careful with his diet since his allergies have been treated.  Feeling fatigued.    HIV 1 RNA Quant (copies/mL)  Date Value  07/07/2019 <20 NOT DETECTED  12/04/2018 <20 NOT DETECTED  04/24/2018 <20 NOT DETECTED   CD4 T Cell Abs (/uL)  Date Value  07/07/2019 580  12/04/2018 525  04/24/2018 680    Review of Systems  Constitutional: Positive for appetite change and fatigue. Negative for unexpected weight change.  Respiratory: Positive for shortness of breath. Negative for cough.   Gastrointestinal: Negative for constipation and diarrhea.  Genitourinary: Negative for difficulty urinating.  Psychiatric/Behavioral: Positive for sleep disturbance.       Objective:   Physical Exam Vitals reviewed.  Constitutional:      Appearance: Normal appearance. He is obese.  HENT:     Mouth/Throat:     Mouth: Mucous membranes are moist.     Pharynx: No oropharyngeal exudate.  Eyes:     Extraocular Movements: Extraocular movements intact.     Pupils: Pupils are equal, round, and reactive to light.    Cardiovascular:     Rate and Rhythm: Normal rate and regular rhythm.  Pulmonary:     Effort: Pulmonary effort is normal.     Breath sounds: Normal breath sounds.  Abdominal:     General: Bowel sounds are normal. There is no distension.     Palpations: Abdomen is soft.     Tenderness: There is no abdominal tenderness.  Musculoskeletal:        General: Normal range of motion.     Cervical back: Normal range of motion and neck supple.     Right lower leg: No edema.     Left lower leg: No edema.  Neurological:     General: No focal deficit present.     Mental Status: He is alert.  Psychiatric:        Mood and Affect: Mood normal.           Assessment & Plan:

## 2020-03-11 NOTE — Assessment & Plan Note (Signed)
Had 2 episodes of L lateral CP. Resolved with rest, lasted 2-3 hr. No diaphoresis. ? radiation to neck.  Will have him seen by CV.

## 2020-03-12 LAB — URINE CYTOLOGY ANCILLARY ONLY
Chlamydia: NEGATIVE
Comment: NEGATIVE
Comment: NORMAL
Neisseria Gonorrhea: NEGATIVE

## 2020-03-12 LAB — T-HELPER CELL (CD4) - (RCID CLINIC ONLY)
CD4 % Helper T Cell: 31 % — ABNORMAL LOW (ref 33–65)
CD4 T Cell Abs: 479 /uL (ref 400–1790)

## 2020-03-13 LAB — CBC
HCT: 43.2 % (ref 38.5–50.0)
Hemoglobin: 14.5 g/dL (ref 13.2–17.1)
MCH: 32.2 pg (ref 27.0–33.0)
MCHC: 33.6 g/dL (ref 32.0–36.0)
MCV: 96 fL (ref 80.0–100.0)
MPV: 13.2 fL — ABNORMAL HIGH (ref 7.5–12.5)
Platelets: 169 10*3/uL (ref 140–400)
RBC: 4.5 10*6/uL (ref 4.20–5.80)
RDW: 14 % (ref 11.0–15.0)
WBC: 4.3 10*3/uL (ref 3.8–10.8)

## 2020-03-13 LAB — COMPREHENSIVE METABOLIC PANEL
AG Ratio: 1.8 (calc) (ref 1.0–2.5)
ALT: 21 U/L (ref 9–46)
AST: 21 U/L (ref 10–35)
Albumin: 4.2 g/dL (ref 3.6–5.1)
Alkaline phosphatase (APISO): 101 U/L (ref 35–144)
BUN: 10 mg/dL (ref 7–25)
CO2: 26 mmol/L (ref 20–32)
Calcium: 9 mg/dL (ref 8.6–10.3)
Chloride: 107 mmol/L (ref 98–110)
Creat: 1.19 mg/dL (ref 0.70–1.33)
Globulin: 2.4 g/dL (calc) (ref 1.9–3.7)
Glucose, Bld: 89 mg/dL (ref 65–99)
Potassium: 3.9 mmol/L (ref 3.5–5.3)
Sodium: 141 mmol/L (ref 135–146)
Total Bilirubin: 0.6 mg/dL (ref 0.2–1.2)
Total Protein: 6.6 g/dL (ref 6.1–8.1)

## 2020-03-13 LAB — LIPID PANEL
Cholesterol: 153 mg/dL (ref <200)
HDL: 52 mg/dL (ref >=40)
LDL Cholesterol (Calc): 82 mg/dL (calc)
Non-HDL Cholesterol (Calc): 101 mg/dL (calc) (ref <130)
Total CHOL/HDL Ratio: 2.9 (calc) (ref <5.0)
Triglycerides: 92 mg/dL (ref <150)

## 2020-03-13 LAB — HIV-1 RNA QUANT-NO REFLEX-BLD
HIV 1 RNA Quant: <20 Copies/mL
HIV-1 RNA Quant, Log: <1.3 Log cps/mL

## 2020-03-13 LAB — RPR: RPR Ser Ql: NONREACTIVE

## 2020-03-13 LAB — PSA: PSA: <0.1 ng/mL (ref <=4.0)

## 2020-03-15 NOTE — Progress Notes (Signed)
Cardiology Office Note:   Date:  03/17/2020  NAME:  Casey Brennan    MRN: [id number] DOB:  [birth year]   PCP:  Campbell Riches, MD  Cardiologist:  No primary care provider on file.  Electrophysiologist:  None   Referring MD: Campbell Riches, MD   Chief Complaint  Patient presents with  . Chest Pain   History of Present Illness:   Casey Brennan is a 59 y.o. male with a hx of HIV, obesity, HLD who is being seen today for the evaluation of chest pain at the request of Campbell Riches, MD. CT PE study in 2018 without coronary calcium.   He reports around 2 weeks ago while in [place] he had an episode of chest tightness while laying down.  He reports he was talking on the phone and lay down at night and had some sharp pain in the left side of his chest.  The pain lasted 2030 minutes.  It was reported as 8 out of 10.  Movement made it better.  He reports has had intermittent episodes since that time.  No association with food.  No associated shortness of breath.  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  His medical history significant for HIV which is well controlled.  He does have hyperlipidemia which is untreated.  He also has high blood pressure which is been elevated lately.  Blood pressure 140/90.  He is only taking metoprolol.  He reports that he would like for me to evaluate this.  He was seen by Dr. Ernst Bowler.  Apparently he had some sort of testing done and was told he had heart abnormality.  We will obtain those records.  His EKG today demonstrates normal sinus rhythm with no acute ST-T changes or evidence of prior infarction.  Most recent kidney profile shows a creatinine of 1.2.  Medical history is also significant for former tobacco abuse.  He smoked nearly 20 years.  No strong family history of heart disease.  Problem List 1. Obesity BMI 33 2. HIV 3. HLD -T chol 153, HDL 52, LDL 82, TG 92 4. HTN  Past Medical History: Past Medical History:  Diagnosis  Date  . Arthritis    r knee   . Asthma    very rare  . Axillary lymphadenopathy   . Bronchitis   . Chronic anemia    normocytic  . Chronic folliculitis   . Elevated PSA   . Environmental and seasonal allergies   . GERD (gastroesophageal reflux disease)    Patient denies  . Gross hematuria   . H/O pericarditis    2010--  myopercarditis--  resolved  . HCAP (healthcare-associated pneumonia) 11/19/2014  . Headache(784.0)    HX SEVERE FRONTAL HA'S  . Hiatal hernia   . History of concussion    2012  &  2013  RESIDUAL HA'S --  RESOLVED  . History of gastric ulcer   . History of kidney stones   . History of MRSA infection 2010   infected boil  . HIV (human immunodeficiency virus infection) ([place]) 1988  . Hypertension   . Internal hemorrhoids   . Lytic bone lesion of hip    WORK-UP DONE BY ONCOLOGIST DR HA --  NOT MALIGNANT  . 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 Pneumonia    hx of  . Post concussion syndrome    resolved  . Prostate cancer ([place]) 04/25/13   gleason 3+3=6, volume 30 gm  .  Sinusitis, chronic 05/14/2015  . Ulcer    hx of gastric  . Wears glasses     Past Surgical History: Past Surgical History:  Procedure Laterality Date  . CARDIAC CATHETERIZATION  03-23-2009  DR COOPER   NORMAL CORONARY ARTERIES  . CHOLECYSTECTOMY N/A 11/06/2014   Procedure: LAPAROSCOPIC CHOLECYSTECTOMY WITH INTRAOPERATIVE CHOLANGIOGRAM;  Surgeon: Judeth Horn, MD;  Location: [facility];  Service: General;  Laterality: N/A;  . COLONOSCOPY  12/26/2011   Procedure: COLONOSCOPY;  Surgeon: Lear Ng, MD;  [facility];  Service: Endoscopy;  Laterality: N/A;  . COLONOSCOPY    . COLONOSCOPY WITH PROPOFOL N/A 07/29/2014   Procedure: COLONOSCOPY WITH PROPOFOL;  Surgeon: Lear Ng, MD;  Location: [facility];  Service: Endoscopy;  Laterality: N/A;  . CYSTOSCOPY N/A 05/18/2018   Procedure: CYSTOSCOPY REMOVAL FOREIGN BODY;  Surgeon: Kathie Rhodes, MD;  [facility];   Service: Urology;  Laterality: N/A;  . CYSTOSCOPY/RETROGRADE/URETEROSCOPY Bilateral 04/25/2013   Procedure: CYSTOSCOPY/ BILATERAL RETROGRADES; BLADDER BIOPSIES;  Surgeon: Alexis Frock, MD;  Location: [facility];  Service: Urology;  Laterality: Bilateral;  . DENTAL EXAMINATION UNDER ANESTHESIA    . ESOPHAGOGASTRODUODENOSCOPY  12/26/2011   Procedure: ESOPHAGOGASTRODUODENOSCOPY (EGD);  Surgeon: Lear Ng, MD;  [facility];  Service: Endoscopy;  Laterality: N/A;  . ESOPHAGOGASTRODUODENOSCOPY (EGD) WITH PROPOFOL N/A 07/29/2014   Procedure: ESOPHAGOGASTRODUODENOSCOPY (EGD) WITH PROPOFOL;  Surgeon: Lear Ng, MD;  Location: [facility];  Service: Endoscopy;  Laterality: N/A;  . EXCISION CHRONIC LEFT BREAST ABSCESS  09-21-2010  . EXCISIONAL BX LEFT BREAST MASS/  I  &  D LEFT BREAST ABSCESS  03-24-2009  . KNEE ARTHROSCOPY Right 1985  . left axilla biopsy  04/2013  . left hip biopsy  10/2012  . LYMPHADENECTOMY Bilateral 08/18/2013   Procedure: LYMPHADENECTOMY "PELVIC LYMPH NODE DISSECTION";  Surgeon: Alexis Frock, MD;  [facility];  Service: Urology;  Laterality: Bilateral;  . PROSTATE BIOPSY N/A 04/25/2013   Procedure: BIOPSY TRANSRECTAL ULTRASONIC PROSTATE (TUBP);  Surgeon: Alexis Frock, MD;  Location: [facility];  Service: Urology;  Laterality: N/A;  . ROBOT ASSISTED LAPAROSCOPIC RADICAL PROSTATECTOMY N/A 08/18/2013   Procedure: ROBOTIC ASSISTED LAPAROSCOPIC RADICAL PROSTATECTOMY;  Surgeon: Alexis Frock, MD;  [facility];  Service: Urology;  Laterality: N/A;  . TOTAL KNEE ARTHROPLASTY Right 08/01/2016   08/17/16  . TOTAL KNEE ARTHROPLASTY Right 08/01/2016   Procedure: TOTAL KNEE ARTHROPLASTY;  Surgeon: Renette Butters, MD;  Location: [facility];  Service: Orthopedics;  Laterality: Right;  . UPPER GASTROINTESTINAL ENDOSCOPY    . UPPER LEG SOFT TISSUE BIOPSY Left 2012   [facility]    Current Medications: Current Meds  Medication  Sig  . albuterol (VENTOLIN HFA) 108 (90 Base) MCG/ACT inhaler INHALE 2 PUFFS INTO THE LUNGS EVERY 6 HOURS AS NEEDED FOR WHEEZING OR SHORTNESS OF BREATH  . ALPRAZolam (XANAX) 0.5 MG tablet TAKE 1 TABLET BY MOUTH AT BEDTIME  . aspirin EC 81 MG tablet Take 81 mg by mouth every Monday, Wednesday, and Friday.   . diclofenac Sodium (VOLTAREN) 1 % GEL APPLY 2 GRAMS EXTERNALLY TO THE AFFECTED AREA TWICE DAILY AS NEEDED FOR PAIN  . DULERA 200-5 MCG/ACT AERO INHALE 2 PUFFS BY MOUTH EVERY MORNING AND ANOTHER 2 PUFFS 12 HOURS LATER  . EPINEPHrine 0.3 mg/0.3 mL IJ SOAJ injection Inject 0.3 mLs (0.3 mg total) into the muscle as needed for anaphylaxis.  . famotidine (PEPCID) 20 MG tablet Take 1 tablet (20 mg total) by mouth 2 (  two) times daily.  [name] gabapentin (NEURONTIN) 300 MG capsule Take 1 capsule (300 mg total) by mouth 4 (four) times daily as needed (pain).  . hydrOXYzine (ATARAX/VISTARIL) 25 MG tablet TAKE 1 TABLET(25 MG) BY MOUTH EVERY NIGHT (Patient taking differently: Take 25 mg by mouth at bedtime. )  . ipratropium (ATROVENT) 0.02 % nebulizer solution USE 2.5 ML(0.5 MG) VIA NEBULIZER THREE TIMES DAILY AS NEEDED FOR WHEEZING OR SHORTNESS OF BREATH (Patient taking differently: Take 0.5 mg by nebulization 3 (three) times daily as needed for wheezing or shortness of breath. )  . ISENTRESS 400 MG tablet TAKE 2 TABLETS(800 MG) BY MOUTH AT BEDTIME  . meclizine (ANTIVERT) 25 MG tablet Take 1 tablet by mouth 2 (two) times daily.  . metoprolol succinate (TOPROL-XL) 25 MG 24 hr tablet Take 25 mg by mouth daily.   . montelukast (SINGULAIR) 10 MG tablet TAKE 1 TABLET(10 MG) BY MOUTH AT BEDTIME  . Multiple Vitamin (MULTIVITAMIN WITH MINERALS) TABS tablet Take 1 tablet by mouth daily after breakfast.   . mupirocin ointment (BACTROBAN) 2 % SMARTSIG:1 Application Topical 2-3 Times Daily  . naproxen (NAPROSYN) 500 MG tablet Take 1 tablet by mouth 2 (two) times daily.  [name] omeprazole (PRILOSEC) 40 MG capsule TAKE 1  CAPSULE(40 MG) BY MOUTH EVERY MORNING  . ondansetron (ZOFRAN ODT) 8 MG disintegrating tablet Take 1 tablet (8 mg total) by mouth every 8 (eight) hours as needed for nausea or vomiting.  Vladimir Faster Glycol-Propyl Glycol (SYSTANE) 0.4-0.3 % GEL ophthalmic gel Place 1 application into both eyes 2 (two) times daily.   . SUMAtriptan (IMITREX) 100 MG tablet TAKE 1 TABLET BY MOUTH EVERY 2 HOURS AS NEEDED FOR MIGRAINE, MAY REPEAT IN 2 HOURS IF HEADACHE PERSISTS OR RECURS  . tizanidine (ZANAFLEX) 6 MG capsule Take 1 capsule (6 mg total) by mouth 3 (three) times daily.  [name] torsemide (DEMADEX) 20 MG tablet Take 20 mg by mouth daily.  . TRUVADA 200-300 MG tablet Take 1 tablet by mouth daily.  . [DISCONTINUED] albuterol (VENTOLIN HFA) 108 (90 Base) MCG/ACT inhaler INHALE 2 PUFFS INTO THE LUNGS EVERY 6 HOURS AS NEEDED FOR WHEEZING OR SHORTNESS OF BREATH  . [DISCONTINUED] l-methylfolate-B6-B12 (METANX) 3-35-2 MG TABS tablet Take 1 tablet by mouth daily.  . [DISCONTINUED] predniSONE (STERAPRED UNI-PAK 21 TAB) 10 MG (21) TBPK tablet Take by mouth daily. Take 6 tabs by mouth daily for 1 day, then 5 tabs for 1 day, then 4 tabs for 1 day, then 3 tabs for 1 day, 2 tabs for 1 day, then 1 tab by mouth daily for 1 day     Allergies:    Chocolate, Descovy [emtricitabine-tenofovir af], Genvoya [elviteg-cobic-emtricit-tenofaf], Morphine and related, Penicillins, Sulfa antibiotics, Tomato, Vancomycin, Flagyl [metronidazole], Milk-related compounds, Toradol [ketorolac tromethamine], and Tramadol   Social History: Social History   Socioeconomic History  . Marital status: Divorced    Spouse name: Not on file  . Number of children: 1  . Years of education: College  . Highest education level: Not on file  Occupational History  . 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 Occupation: disability  Tobacco Use  . Smoking status: Former Smoker    Packs/day: 1.50    Years: 23.00    Pack years:  34.50    Types: Cigars, Cigarettes    Quit date: 05/22/2010    Years since quitting: 9.8  . Smokeless tobacco: Never Used  Vaping Use  . Vaping Use:  Never used  Substance and Sexual Activity  . Alcohol use: Yes    Alcohol/week: 0.0 standard drinks    Comment: 1-2 drinks a week   . Drug use: No  . Sexual activity: Not Currently    Birth control/protection: Condom    Comment: pt. declined condoms 03/11/20  Other Topics Concern  . Not on file  Social History Narrative   Sister April stays with him currently although he hopes to return to truck driving. 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 Sister is starting medical school next year.   Patient is divorced and lives with his sister.   Patient has one adult son.   Patient is part-time, driving a truck.   Patient has a college education.   Patient is right-handed.   Patient drinks 2-3 cups of coffee and a 4-5 cups of tea.   Social Determinants of Health   Financial Resource Strain:   . Difficulty of Paying Living Expenses: Not on file  Food Insecurity:   . Worried About Charity fundraiser in the Last Year: Not on file  . Ran Out of Food in the Last Year: Not on file  Transportation Needs:   . Lack of Transportation (Medical): Not on file  . Lack of Transportation (Non-Medical): Not on file  Physical Activity:   . Days of Exercise per Week: Not on file  . Minutes of Exercise per Session: Not on file  Stress:   . Feeling of Stress : Not on file  Social Connections:   . Frequency of Communication with Friends and Family: Not on file  . Frequency of Social Gatherings with Friends and Family: Not on file  . Attends Religious Services: Not on file  . Active Member of Clubs or Organizations: Not on file  . Attends Archivist Meetings: Not on file  . Marital Status: Not on file     Family History: The patient's family history includes Asthma in his father and sister; Cancer in his father and maternal uncle;  Diabetes in his father; Hypertension in his sister; Stroke in his father. There is no history of Colon cancer, Colon polyps, Esophageal cancer, Stomach cancer, or Rectal cancer.  ROS:   All other ROS reviewed and negative. Pertinent positives noted in the HPI.     EKGs/Labs/Other Studies Reviewed:   The following studies were personally reviewed by me today:  EKG:  EKG is ordered today.  The ekg ordered today demonstrates normal sinus rhythm, heart rate 69, no acute ischemic changes, no evidence of prior infarction, and was personally reviewed by me.   Recent Labs: 03/11/2020: ALT 21; BUN 10; Creat 1.19; Hemoglobin 14.5; Platelets 169; Potassium 3.9; Sodium 141   Recent Lipid Panel    Component Value Date/Time   CHOL 153 03/11/2020 1540   TRIG 92 03/11/2020 1540   HDL 52 03/11/2020 1540   CHOLHDL 2.9 03/11/2020 1540   VLDL 19 04/20/2016 1428   LDLCALC 82 03/11/2020 1540    Physical Exam:   VS:  BP 140/90   Pulse 69   Ht 5' 9"  (1.753 m)   Wt 223 lb (101.2 kg)   BMI 32.93 kg/m    Wt Readings from Last 3 Encounters:  03/17/20 223 lb (101.2 kg)  03/11/20 224 lb (101.6 kg)  10/27/19 218 lb (98.9 kg)    General: Well nourished, well developed, in no acute distress Heart: Atraumatic, normal size  Eyes: PEERLA, EOMI  Neck: Supple, no JVD Endocrine: No  thryomegaly Cardiac: Normal S1, S2; RRR; no murmurs, rubs, or gallops Lungs: Clear to auscultation bilaterally, no wheezing, rhonchi or rales  Abd: Soft, nontender, no hepatomegaly  Ext: No edema, pulses 2+ Musculoskeletal: No deformities, BUE and BLE strength normal and equal Skin: Warm and dry, no rashes   Neuro: Alert and oriented to person, place, time, and situation, CNII-XII grossly intact, no focal deficits  Psych: Normal mood and affect   ASSESSMENT:   Casey Brennan is a 59 y.o. male who presents for the following: 1. Chest pain, unspecified type   2. Mixed hyperlipidemia   3. Essential hypertension     PLAN:     1. Chest pain, unspecified type -Atypical chest pain.  CVD risk factors include HIV, hyperlipidemia, former tobacco abuse, obesity, sedentary lifestyle.  He apparently had some sort of testing done with [facility].  We will obtain these records.  I have recommended a coronary CTA.  He had no coronary calcium in 2018.  Should get a good scan.  I suspect his symptoms are acid reflux related but given his CV risk factors, coronary CTA is indicated.  He will take his metoprolol succinate 25 mg twice before the scan.  His heart rate is good low today in office.  We will further restratify him with those results.  We will also obtain the records from the cardiologist all recently.  2. Mixed hyperlipidemia -Further titration pending coronary CTA.  3. HTN -Add Norvasc 10 mg today.  Disposition: Return in about 3 months (around 06/16/2020).  Medication Adjustments/Labs and Tests Ordered: Current medicines are reviewed at length with the patient today.  Concerns regarding medicines are outlined above.  Orders Placed This Encounter  Procedures  . CT CORONARY MORPH W/CTA COR W/SCORE W/CA W/CM &/OR WO/CM  . CT CORONARY FRACTIONAL FLOW RESERVE DATA PREP  . CT CORONARY FRACTIONAL FLOW RESERVE FLUID ANALYSIS  . EKG 12-Lead   Meds ordered this encounter  Medications  . amLODipine (NORVASC) 10 MG tablet    Sig: Take 1 tablet (10 mg total) by mouth daily.    Dispense:  180 tablet    Refill:  3    Patient Instructions  Medication Instructions:  Take your Metoprolol 2 hours before the CT when it gets scheduled. Start Amlodipine 10 mg daily   *If you need a refill on your cardiac medications before your next appointment, please call your pharmacy*  Testing/Procedures: Your physician has requested that you have cardiac CT. Cardiac computed tomography (CT) is a painless test that uses an x-ray machine to take clear, detailed pictures of your heart. For further information please visit  [email or web address]. Please follow instruction sheet as given.    Follow-Up: At [facility], you and your health needs are our priority.  As part of our continuing mission to provide you with exceptional heart care, we have created designated Provider Care Teams.  These Care Teams include your primary Cardiologist (physician) and Advanced Practice Providers (APPs -  Physician Assistants and Nurse Practitioners) who all work together to provide you with the care you need, when you need it.  We recommend signing up for the patient portal called "MyChart".  Sign up information is provided on this After Visit Summary.  MyChart is used to connect with patients for Virtual Visits (Telemedicine).  Patients are able to view lab/test results, encounter notes, upcoming appointments, etc.  Non-urgent messages can be sent to your provider as well.   To learn more about what you can do  with MyChart, go to [email or web address].    Your next appointment:   3 month(s)  The format for your next appointment:   In Person  Provider:   Eleonore Chiquito, MD   Other Instructions  Your cardiac CT will be scheduled at one of the below locations:   [facility] [address] [place], [place] [address] [phone]  [place] [address] [place], [place] [address] [phone]  If scheduled at [facility], please arrive at the [facility] main entrance of [facility] 30 minutes prior to test start time. Proceed to the [facility] Radiology Department (first floor) to check-in and test prep.  If scheduled at [facility], please arrive 15 mins early for check-in and test prep.  Please follow these instructions carefully (unless otherwise directed):  Hold all erectile dysfunction medications at least 3 days (72 hrs) prior to test.  On the Night Before the Test: . Be sure to Drink plenty of  water. . Do not consume any caffeinated/decaffeinated beverages or chocolate 12 hours prior to your test. . Do not take any antihistamines 12 hours prior to your test.  On the Day of the Test: . Drink plenty of water. Do not drink any water within one hour of the test. . Do not eat any food 4 hours prior to the test. . You may take your regular medications prior to the test.  . Take metoprolol (Lopressor) two hours prior to test. . HOLD Furosemide/Hydrochlorothiazide morning of the test. . FEMALES- please wear underwire-free bra if available       After the Test: . Drink plenty of water. . After receiving IV contrast, you may experience a mild flushed feeling. This is normal. . On occasion, you may experience a mild rash up to 24 hours after the test. This is not dangerous. If this occurs, you can take Benadryl 25 mg and increase your fluid intake. . If you experience trouble breathing, this can be serious. If it is severe call 911 IMMEDIATELY. If it is mild, please call our office. . If you take any of these medications: Glipizide/Metformin, Avandament, Glucavance, please do not take 48 hours after completing test unless otherwise instructed.   Once we have confirmed authorization from your insurance company, we will call you to set up a date and time for your test. Based on how quickly your insurance processes prior authorizations requests, please allow up to 4 weeks to be contacted for scheduling your Cardiac CT appointment. Be advised that routine Cardiac CT appointments could be scheduled as many as 8 weeks after your provider has ordered it.  For non-scheduling related questions, please contact the cardiac imaging nurse navigator should you have any questions/concerns: Marchia Bond, Cardiac Imaging Nurse Navigator Burley Saver, Interim Cardiac Imaging Nurse [employer] and Vascular Services Direct Office Dial: [phone]   For scheduling needs, including cancellations  and rescheduling, please call Vivien Rota at [phone], option 3.       Signed, Addison Naegeli. Audie Box, [place]  [address], [place], [place] [address] [phone]  03/17/2020 4:27 PM

## 2020-03-17 ENCOUNTER — Encounter: Payer: Self-pay | Admitting: Cardiovascular Disease

## 2020-03-17 ENCOUNTER — Other Ambulatory Visit: Payer: Self-pay

## 2020-03-17 ENCOUNTER — Ambulatory Visit (INDEPENDENT_AMBULATORY_CARE_PROVIDER_SITE_OTHER): Payer: Medicare Other | Admitting: Cardiovascular Disease

## 2020-03-17 VITALS — BP 140/90 | HR 69 | Ht 69.0 in | Wt 223.0 lb

## 2020-03-17 DIAGNOSIS — E782 Mixed hyperlipidemia: Secondary | ICD-10-CM

## 2020-03-17 DIAGNOSIS — R079 Chest pain, unspecified: Secondary | ICD-10-CM | POA: Diagnosis not present

## 2020-03-17 DIAGNOSIS — I10 Essential (primary) hypertension: Secondary | ICD-10-CM

## 2020-03-17 MED ORDER — AMLODIPINE BESYLATE 10 MG PO TABS: 10.0000 mg | ORAL_TABLET | Freq: Every day | ORAL | 3 refills | Status: DC

## 2020-03-17 NOTE — Patient Instructions (Addendum)
Medication Instructions:  Take your Metoprolol 2 hours before the CT when it gets scheduled. Start Amlodipine 10 mg daily   *If you need a refill on your cardiac medications before your next appointment, please call your pharmacy*  Testing/Procedures: Your physician has requested that you have cardiac CT. Cardiac computed tomography (CT) is a painless test that uses an x-ray machine to take clear, detailed pictures of your heart. For further information please visit [email or web address]. Please follow instruction sheet as given.    Follow-Up: At [facility], you and your health needs are our priority.  As part of our continuing mission to provide you with exceptional heart care, we have created designated Provider Care Teams.  These Care Teams include your primary Cardiologist (physician) and Advanced Practice Providers (APPs -  Physician Assistants and Nurse Practitioners) who all work together to provide you with the care you need, when you need it.  We recommend signing up for the patient portal called "MyChart".  Sign up information is provided on this After Visit Summary.  MyChart is used to connect with patients for Virtual Visits (Telemedicine).  Patients are able to view lab/test results, encounter notes, upcoming appointments, etc.  Non-urgent messages can be sent to your provider as well.   To learn more about what you can do with MyChart, go to [email or web address].    Your next appointment:   3 month(s)  The format for your next appointment:   In Person  Provider:   Eleonore Chiquito, MD   Other Instructions  Your cardiac CT will be scheduled at one of the below locations:   [facility] [address] [place], [place] [address] [phone]  [facility] [address] [place], [place] [address] [phone]  If scheduled at [facility], please arrive at the [facility] main entrance of [facility] 30 minutes prior to test start time. Proceed to the [facility] Radiology Department (first floor) to check-in and test prep.  If scheduled at [facility], please arrive 15 mins early for check-in and test prep.  Please follow these instructions carefully (unless otherwise directed):  Hold all erectile dysfunction medications at least 3 days (72 hrs) prior to test.  On the Night Before the Test: . Be sure to Drink plenty of water. . Do not consume any caffeinated/decaffeinated beverages or chocolate 12 hours prior to your test. . Do not take any antihistamines 12 hours prior to your test.  On the Day of the Test: . Drink plenty of water. Do not drink any water within one hour of the test. . Do not eat any food 4 hours prior to the test. . You may take your regular medications prior to the test.  . Take metoprolol (Lopressor) two hours prior to test. . HOLD Furosemide/Hydrochlorothiazide morning of the test. . FEMALES- please wear underwire-free bra if available       After the Test: . Drink plenty of water. . After receiving IV contrast, you may experience a mild flushed feeling. This is normal. . On occasion, you may experience a mild rash up to 24 hours after the test. This is not dangerous. If this occurs, you can take Benadryl 25 mg and increase your fluid intake. . If you experience trouble breathing, this can be serious. If it is severe call 911 IMMEDIATELY. If it is mild, please call our office. . If you take any of these medications: Glipizide/Metformin, Avandament,  Glucavance, please do not take 48 hours after completing test unless otherwise instructed.   Once we have confirmed authorization from your insurance company, we will call you to set up a date and time for your test. Based on how quickly your insurance processes prior authorizations requests, please allow up to 4 weeks to be contacted for scheduling your Cardiac CT appointment.  Be advised that routine Cardiac CT appointments could be scheduled as many as 8 weeks after your provider has ordered it.  For non-scheduling related questions, please contact the cardiac imaging nurse navigator should you have any questions/concerns: Marchia Bond, Cardiac Imaging Nurse Navigator Burley Saver, Interim Cardiac Imaging Nurse [place] and Vascular Services Direct Office Dial: [phone]   For scheduling needs, including cancellations and rescheduling, please call Vivien Rota at [phone], option 3.

## 2020-03-18 ENCOUNTER — Telehealth: Payer: Self-pay | Admitting: Cardiovascular Disease

## 2020-03-18 NOTE — Telephone Encounter (Signed)
Pt c/o medication issue:  1. Name of Medication: amLODipine (NORVASC) 10 MG tablet  2. How are you currently taking this medication (dosage and times per day)? N/A  3. Are you having a reaction (difficulty breathing--STAT)? No  4. What is your medication issue? Patient's sister is calling to make Dr. Audie Box aware that this medication has caused swelling when patient was taking it in the past. She is requesting to discuss alternatives. Please advise.

## 2020-03-18 NOTE — Telephone Encounter (Signed)
Spoke with pt's sister would report pt has renal insufficieny and can't take losartan or amlodipine because it causes him to swell. She state pt was unaware when MD prescribed medication. Daughter is calling requesting if Dr. Marisue Ivan could change amlodipine to something different.

## 2020-03-19 ENCOUNTER — Other Ambulatory Visit: Payer: Self-pay | Admitting: Infectious Diseases

## 2020-03-19 ENCOUNTER — Other Ambulatory Visit: Payer: Self-pay | Admitting: Internal Medicine

## 2020-03-19 ENCOUNTER — Other Ambulatory Visit: Payer: Self-pay | Admitting: Neurology

## 2020-03-19 ENCOUNTER — Telehealth: Payer: Self-pay | Admitting: Cardiovascular Disease

## 2020-03-19 DIAGNOSIS — G43009 Migraine without aura, not intractable, without status migrainosus: Secondary | ICD-10-CM

## 2020-03-19 DIAGNOSIS — G4700 Insomnia, unspecified: Secondary | ICD-10-CM

## 2020-03-19 MED ORDER — NIFEDIPINE ER OSMOTIC RELEASE 60 MG PO TB24: 60.0000 mg | ORAL_TABLET | Freq: Every day | ORAL | 3 refills | Status: DC

## 2020-03-19 NOTE — Telephone Encounter (Signed)
Pt sister updated and verbalized understanding. New orders placed.

## 2020-03-19 NOTE — Telephone Encounter (Signed)
Let's call in nifedipine XR 60 mg daily.   [place] T. Audie Box, [place]  [address] [place], [place] [address] [phone]  8:22 AM

## 2020-03-19 NOTE — Telephone Encounter (Signed)
New message     Pt c/o medication issue:  1. Name of Medication: nifedipine  2. How are you currently taking this medication (dosage and times per day)? 60mg  daily  3. Are you having a reaction (difficulty breathing--STAT)? no  4. What is your medication issue? Pt do not want to take this medication because the possible side effects are the same as another medication he once took and had lots of problems.  Is there another bp medication pt can take?

## 2020-03-22 ENCOUNTER — Telehealth: Payer: Self-pay

## 2020-03-22 NOTE — Telephone Encounter (Signed)
Please advise. Thanks.  

## 2020-03-22 NOTE — Telephone Encounter (Signed)
Received refill request from pharmacy for the follow medications: Imitrex 100mg  and bactroban ointment.  Routing to MD for approval. Eugenia Mcalpine

## 2020-03-22 NOTE — Telephone Encounter (Signed)
Called sister- advised that Dr.O'Neal was on vacation and once he responded we would let her know. Sister thankful for call back-

## 2020-03-22 NOTE — Telephone Encounter (Signed)
Patient's sister calling back. She would like to know if he could take atenolol.

## 2020-03-22 NOTE — Telephone Encounter (Signed)
Patient's sister following up. She states that her brother does not want to take this because it has the same side effects as amlodipine.

## 2020-03-23 NOTE — Telephone Encounter (Signed)
Ok to refill. Thanks 

## 2020-03-23 NOTE — Telephone Encounter (Signed)
Nifedipine does not have the same side effect as amlodipine. Atenolol is not a good medications for BP. I would prescribe coreg 25 mg BID. He has a CCTA planned. I would recommend he take the coreg 2 hours before his cardiac CT scan and discontinue the metoprolol that he takes.   [place] T. Audie Box, [place]  [address], [place], [place] [address] [phone]  8:29 AM

## 2020-03-24 MED ORDER — CARVEDILOL 25 MG PO TABS: 25.0000 mg | ORAL_TABLET | Freq: Two times a day (BID) | ORAL | 3 refills | Status: DC

## 2020-03-24 NOTE — Telephone Encounter (Signed)
Approval noted per Dr. [name]. Pending medication approved via interface. Casey Brennan

## 2020-03-24 NOTE — Telephone Encounter (Signed)
Called sister April and made aware, sent in medication.   Patient sister verbalized understanding.

## 2020-04-02 ENCOUNTER — Telehealth: Payer: Self-pay | Admitting: Cardiovascular Disease

## 2020-04-02 NOTE — Telephone Encounter (Signed)
Pt c/o medication issue:  1. Name of Medication: carvedilol (COREG) 25 MG tablet  2. How are you currently taking this medication (dosage and times per day)? As directed   3. Are you having a reaction (difficulty breathing--STAT)? Dizzy, tired, swelling in legs   4. What is your medication issue? Pt can not tolerate this medication. The patient sent Dr. Audie Box a MyChart message earlier in the week but the patient has not heard a response yet. Please call the patient's sister, April for medication changes. 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

## 2020-04-02 NOTE — Telephone Encounter (Signed)
Had him reduce the dose to 12.5 mg twice daily.   [place] T. Audie Box, [place]  [address], [place], [place] [address] [phone]  11:55 AM

## 2020-04-02 NOTE — Telephone Encounter (Signed)
Called sister- advised that Dr.O'Neal is in the hospital this week and has been delayed on responding to medication concern.  Sister did understand- I did advise I would send a message to him and have him take a look at his message and give recommendations.   Sister thankful for call back.

## 2020-04-02 NOTE — Telephone Encounter (Signed)
Responded to patient via mychart

## 2020-04-06 ENCOUNTER — Telehealth (HOSPITAL_COMMUNITY): Payer: Self-pay | Admitting: *Deleted

## 2020-04-06 NOTE — Telephone Encounter (Signed)

## 2020-04-07 ENCOUNTER — Ambulatory Visit (HOSPITAL_COMMUNITY)
Admission: RE | Admit: 2020-04-07 | Discharge: 2020-04-07 | Disposition: A | Discharge status: Home / Self Care | Payer: Medicare Other | Source: Ambulatory Visit | Attending: Cardiovascular Disease | Admitting: Cardiovascular Disease

## 2020-04-07 ENCOUNTER — Other Ambulatory Visit: Payer: Self-pay

## 2020-04-07 DIAGNOSIS — R079 Chest pain, unspecified: Secondary | ICD-10-CM

## 2020-04-07 DIAGNOSIS — I208 Other forms of angina pectoris: Secondary | ICD-10-CM | POA: Diagnosis not present

## 2020-04-07 MED ORDER — NITROGLYCERIN 0.4 MG SL SUBL
0.8000 mg | SUBLINGUAL_TABLET | Freq: Once | SUBLINGUAL | Status: AC
  Administered 2020-04-07: 0.8 mg via SUBLINGUAL

## 2020-04-07 MED ORDER — NITROGLYCERIN 0.4 MG SL SUBL
SUBLINGUAL_TABLET | SUBLINGUAL | Status: AC
  Filled 2020-04-07: qty 2

## 2020-04-07 MED ORDER — IOHEXOL 350 MG/ML SOLN
80.0000 mL | Freq: Once | INTRAVENOUS | Status: AC | PRN
  Administered 2020-04-07: 80 mL via INTRAVENOUS

## 2020-04-07 MED ORDER — METOPROLOL TARTRATE 5 MG/5ML IV SOLN
5.0000 mg | INTRAVENOUS | Status: DC | PRN
  Administered 2020-04-07: 5 mg via INTRAVENOUS

## 2020-04-07 NOTE — Progress Notes (Signed)
Cardiology Office Note:   Date:  04/08/2020  NAME:  Casey Brennan    MRN: [id number] DOB:  1960-10-14   PCP:  Campbell Riches, MD  Cardiologist:  No primary care provider on file.   Referring MD: Campbell Riches, MD   Chief Complaint  Patient presents with  . Hypertension   History of Present Illness:   Casey Brennan is a 59 y.o. male with a hx of HIV, HLD, HTN who presents for follow-up. He was seen recently for CP. CCTA normal with coronary calcium score of 0. He has had issues tolerating coreg. Needs to get him on appropriate BP medications. He presents with his sister.  Blood pressure is 122/90.  He is not taking any medications other than his metoprolol 25 mg daily.  Apparently he had swelling with Coreg.  He had lower extreme edema with nifedipine and amlodipine.  He is also had upset stomach and cramping with losartan.  We did go over the results of his CT scan in office.  He has no evidence of coronary calcium and has normal heart arteries on that scan.  He also had his most recent echocardiogram from 2018 uploaded in our system.  This was done with Ernst Bowler.  This demonstrated normal EF 55-60% with grade 1 diastolic dysfunction and minimal mitral regurgitation tricuspid regurgitation.  I did inform them that this is a very normal echo.  He reports he tries to do some exercise when he is on the road.  He works as a Production designer, theatre/television/film.  He has no structured regimented exercise.  He denies any major symptoms of shortness of breath.  He is still getting intermittent episodes of chest pain.  They occur once every few weeks.  Described as sharp left-sided pain.  Last 3 to 4 minutes.  No identifiable trigger and no alleviating factor.  He is on Prilosec.  I recommended Nexium.  I suspect his symptoms are acid reflux related.  Due to his blood pressure being within normal limits today I recommended a blood pressure log and modifying his diet.  I also recommended further exercise.  He  may not need aggressive blood pressure control.  Overall his cardiac work-up has been negative.  Problem List 1. Obesity BMI 33 2. HIV 3. HLD -T chol 153, HDL 52, LDL 82, TG 92 4. HTN  Past Medical History: Past Medical History:  Diagnosis Date  . Arthritis    r knee   . Asthma    very rare  . Axillary lymphadenopathy   . Bronchitis   . Chronic anemia    normocytic  . Chronic folliculitis   . Elevated PSA   . Environmental and seasonal allergies   . GERD (gastroesophageal reflux disease)    Patient denies  . Gross hematuria   . H/O pericarditis    2010--  myopercarditis--  resolved  . HCAP (healthcare-associated pneumonia) 11/19/2014  . Headache(784.0)    HX SEVERE FRONTAL HA'S  . Hiatal hernia   . History of concussion    2012  &  2013  RESIDUAL HA'S --  RESOLVED  . History of gastric ulcer   . History of kidney stones   . History of MRSA infection 2010   infected boil  . HIV (human immunodeficiency virus infection) ([facility]) 1988  . Hypertension   . Internal hemorrhoids   . Lytic bone lesion of hip    WORK-UP DONE BY ONCOLOGIST DR HA --  NOT MALIGNANT  .  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 Pneumonia    hx of  . Post concussion syndrome    resolved  . Prostate cancer ([facility]) 04/25/13   gleason 3+3=6, volume 30 gm  . Sinusitis, chronic 05/14/2015  . Ulcer    hx of gastric  . Wears glasses     Past Surgical History: Past Surgical History:  Procedure Laterality Date  . CARDIAC CATHETERIZATION  03-23-2009  DR COOPER   NORMAL CORONARY ARTERIES  . CHOLECYSTECTOMY N/A 11/06/2014   Procedure: LAPAROSCOPIC CHOLECYSTECTOMY WITH INTRAOPERATIVE CHOLANGIOGRAM;  Surgeon: Judeth Horn, MD;  Location: [facility];  Service: General;  Laterality: N/A;  . COLONOSCOPY  12/26/2011   Procedure: COLONOSCOPY;  Surgeon: Lear Ng, MD;  Location: [facility];  Service: Endoscopy;  Laterality: N/A;  . COLONOSCOPY    . COLONOSCOPY WITH PROPOFOL N/A 07/29/2014   Procedure:  COLONOSCOPY WITH PROPOFOL;  Surgeon: Lear Ng, MD;  Location: [facility];  Service: Endoscopy;  Laterality: N/A;  . CYSTOSCOPY N/A 05/18/2018   Procedure: CYSTOSCOPY REMOVAL FOREIGN BODY;  Surgeon: Kathie Rhodes, MD;  Location: [facility];  Service: Urology;  Laterality: N/A;  . CYSTOSCOPY/RETROGRADE/URETEROSCOPY Bilateral 04/25/2013   Procedure: CYSTOSCOPY/ BILATERAL RETROGRADES; BLADDER BIOPSIES;  Surgeon: Alexis Frock, MD;  Location: [facility];  Service: Urology;  Laterality: Bilateral;  . DENTAL EXAMINATION UNDER ANESTHESIA    . ESOPHAGOGASTRODUODENOSCOPY  12/26/2011   Procedure: ESOPHAGOGASTRODUODENOSCOPY (EGD);  Surgeon: Lear Ng, MD;  Location: [facility];  Service: Endoscopy;  Laterality: N/A;  . ESOPHAGOGASTRODUODENOSCOPY (EGD) WITH PROPOFOL N/A 07/29/2014   Procedure: ESOPHAGOGASTRODUODENOSCOPY (EGD) WITH PROPOFOL;  Surgeon: Lear Ng, MD;  Location: [facility];  Service: Endoscopy;  Laterality: N/A;  . EXCISION CHRONIC LEFT BREAST ABSCESS  09-21-2010  . EXCISIONAL BX LEFT BREAST MASS/  I  &  D LEFT BREAST ABSCESS  03-24-2009  . KNEE ARTHROSCOPY Right 1985  . left axilla biopsy  04/2013  . left hip biopsy  10/2012  . LYMPHADENECTOMY Bilateral 08/18/2013   Procedure: LYMPHADENECTOMY "PELVIC LYMPH NODE DISSECTION";  Surgeon: Alexis Frock, MD;  Location: [facility];  Service: Urology;  Laterality: Bilateral;  . PROSTATE BIOPSY N/A 04/25/2013   Procedure: BIOPSY TRANSRECTAL ULTRASONIC PROSTATE (TUBP);  Surgeon: Alexis Frock, MD;  [facility];  Service: Urology;  Laterality: N/A;  . ROBOT ASSISTED LAPAROSCOPIC RADICAL PROSTATECTOMY N/A 08/18/2013   Procedure: ROBOTIC ASSISTED LAPAROSCOPIC RADICAL PROSTATECTOMY;  Surgeon: Alexis Frock, MD;  Location: [facility];  Service: Urology;  Laterality: N/A;  . TOTAL KNEE ARTHROPLASTY Right 08/01/2016   08/17/16  . TOTAL KNEE ARTHROPLASTY Right 08/01/2016   Procedure: TOTAL KNEE  ARTHROPLASTY;  Surgeon: Renette Butters, MD;  Location: [facility];  Service: Orthopedics;  Laterality: Right;  . UPPER GASTROINTESTINAL ENDOSCOPY    . UPPER LEG SOFT TISSUE BIOPSY Left 2012   lthigh    Current Medications: Current Meds  Medication Sig  . albuterol (VENTOLIN HFA) 108 (90 Base) MCG/ACT inhaler INHALE 2 PUFFS INTO THE LUNGS EVERY 6 HOURS AS NEEDED FOR WHEEZING OR SHORTNESS OF BREATH  . ALPRAZolam (XANAX) 0.5 MG tablet TAKE 1 TABLET BY MOUTH AT BEDTIME  . aspirin EC 81 MG tablet Take 81 mg by mouth every Monday, Wednesday, and Friday.   . diclofenac Sodium (VOLTAREN) 1 % GEL APPLY 2 GRAMS EXTERNALLY TO THE AFFECTED AREA TWICE DAILY AS NEEDED FOR PAIN  . EPINEPHrine 0.3 mg/0.3 mL IJ SOAJ injection Inject 0.3 mLs (0.3 mg total) into the muscle as needed  for anaphylaxis.  . famotidine (PEPCID) 20 MG tablet Take 1 tablet (20 mg total) by mouth 2 (two) times daily.  [name] gabapentin (NEURONTIN) 300 MG capsule Take 1 capsule (300 mg total) by mouth 4 (four) times daily as needed (pain).  . hydrOXYzine (ATARAX/VISTARIL) 25 MG tablet TAKE 1 TABLET(25 MG) BY MOUTH EVERY NIGHT (Patient taking differently: Take 25 mg by mouth at bedtime. )  . ipratropium (ATROVENT) 0.02 % nebulizer solution USE 2.5 ML(0.5 MG) VIA NEBULIZER THREE TIMES DAILY AS NEEDED FOR WHEEZING OR SHORTNESS OF BREATH (Patient taking differently: Take 0.5 mg by nebulization 3 (three) times daily as needed for wheezing or shortness of breath. )  . ISENTRESS 400 MG tablet TAKE 2 TABLETS(800 MG) BY MOUTH AT BEDTIME  . meclizine (ANTIVERT) 25 MG tablet Take 1 tablet by mouth 2 (two) times daily.  . metoprolol succinate (TOPROL-XL) 25 MG 24 hr tablet Take 1 tablet (25 mg total) by mouth daily.  . mometasone-formoterol (DULERA) 200-5 MCG/ACT AERO INHALE 2 PUFFS BY MOUTH EVERY MORNING AND 2 PUFFS 12 HOURS LATER  . montelukast (SINGULAIR) 10 MG tablet TAKE 1 TABLET(10 MG) BY MOUTH AT BEDTIME  . Multiple Vitamin (MULTIVITAMIN WITH  MINERALS) TABS tablet Take 1 tablet by mouth daily after breakfast.   . mupirocin ointment (BACTROBAN) 2 % APPLY EXTERNALLY TO THE AFFECTED AREA TWICE DAILY FOR 3 WEEKS, PLACE COTTON BETWEEN TOES TO ALLOW BETTER AERATION AS NEEDED  . naproxen (NAPROSYN) 500 MG tablet Take 1 tablet by mouth 2 (two) times daily.  . ondansetron (ZOFRAN ODT) 8 MG disintegrating tablet Take 1 tablet (8 mg total) by mouth every 8 (eight) hours as needed for nausea or vomiting.  Vladimir Faster Glycol-Propyl Glycol (SYSTANE) 0.4-0.3 % GEL ophthalmic gel Place 1 application into both eyes 2 (two) times daily.   . SUMAtriptan (IMITREX) 100 MG tablet TAKE 1 TABLET BY MOUTH EVERY 2 HOURS AS NEEDED FOR MIGRAINE, MAY REPEAT IN 2 HOURS IF HEADACHE PERSISTS OR RECURS  . tizanidine (ZANAFLEX) 6 MG capsule Take 1 capsule (6 mg total) by mouth 3 (three) times daily.  [name] torsemide (DEMADEX) 20 MG tablet Take 20 mg by mouth daily as needed.  . TRUVADA 200-300 MG tablet Take 1 tablet by mouth daily.  . [DISCONTINUED] metoprolol succinate (TOPROL-XL) 25 MG 24 hr tablet Take 25 mg by mouth daily.  . [DISCONTINUED] omeprazole (PRILOSEC) 40 MG capsule TAKE 1 CAPSULE(40 MG) BY MOUTH EVERY MORNING     Allergies:    Chocolate, Descovy [emtricitabine-tenofovir af], Genvoya [elviteg-cobic-emtricit-tenofaf], Morphine and related, Penicillins, Sulfa antibiotics, Tomato, Vancomycin, Carvedilol, Flagyl [metronidazole], Milk-related compounds, Toradol [ketorolac tromethamine], and Tramadol   Social History: Social History   Socioeconomic History  . Marital status: Divorced    Spouse name: Not on file  . Number of children: 1  . Years of education: College  . Highest education level: Not on file  Occupational History  . 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 Occupation: disability  Tobacco Use  . Smoking status: Former Smoker    Packs/day: 1.50    Years: 23.00    Pack years: 34.50    Types: Cigars,  Cigarettes    Quit date: 05/22/2010    Years since quitting: 9.8  . Smokeless tobacco: Never Used  Vaping Use  . Vaping Use: Never used  Substance and Sexual Activity  . Alcohol use: Yes    Alcohol/week: 0.0 standard drinks    Comment: 1-2 drinks  a week   . Drug use: No  . Sexual activity: Not Currently    Birth control/protection: Condom    Comment: pt. declined condoms 03/11/20  Other Topics Concern  . Not on file  Social History Narrative   Sister April stays with him currently although he hopes to return to truck driving. 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 Sister is starting medical school next year.   Patient is divorced and lives with his sister.   Patient has one adult son.   Patient is part-time, driving a truck.   Patient has a college education.   Patient is right-handed.   Patient drinks 2-3 cups of coffee and a 4-5 cups of tea.   Social Determinants of Health   Financial Resource Strain:   . Difficulty of Paying Living Expenses: Not on file  Food Insecurity:   . Worried About Charity fundraiser in the Last Year: Not on file  . Ran Out of Food in the Last Year: Not on file  Transportation Needs:   . Lack of Transportation (Medical): Not on file  . Lack of Transportation (Non-Medical): Not on file  Physical Activity:   . Days of Exercise per Week: Not on file  . Minutes of Exercise per Session: Not on file  Stress:   . Feeling of Stress : Not on file  Social Connections:   . Frequency of Communication with Friends and Family: Not on file  . Frequency of Social Gatherings with Friends and Family: Not on file  . Attends Religious Services: Not on file  . Active Member of Clubs or Organizations: Not on file  . Attends Archivist Meetings: Not on file  . Marital Status: Not on file     Family History: The patient's family history includes Asthma in his father and sister; Cancer in his father and maternal uncle; Diabetes in his father;  Hypertension in his sister; Stroke in his father. There is no history of Colon cancer, Colon polyps, Esophageal cancer, Stomach cancer, or Rectal cancer.  ROS:   All other ROS reviewed and negative. Pertinent positives noted in the HPI.     EKGs/Labs/Other Studies Reviewed:   The following studies were personally reviewed by me today:  CCTA 04/07/2020  IMPRESSION: 1. Coronary calcium score of 0.  2. Normal coronary origin with right dominance.  3. Normal coronary arteries.  RECOMMENDATIONS: 1. No evidence of CAD (0%). Consider non-atherosclerotic causes of chest pain.  Recent Labs: 03/11/2020: ALT 21; BUN 10; Creat 1.19; Hemoglobin 14.5; Platelets 169; Potassium 3.9; Sodium 141   Recent Lipid Panel    Component Value Date/Time   CHOL 153 03/11/2020 1540   TRIG 92 03/11/2020 1540   HDL 52 03/11/2020 1540   CHOLHDL 2.9 03/11/2020 1540   VLDL 19 04/20/2016 1428   LDLCALC 82 03/11/2020 1540    Physical Exam:   VS:  BP 122/90   Pulse 87   Temp (!) 96.4 F (35.8 C)   Ht 5\' 9"  (1.753 m)   Wt 223 lb 12.8 oz (101.5 kg)   SpO2 94%   BMI 33.05 kg/m    Wt Readings from Last 3 Encounters:  04/08/20 223 lb 12.8 oz (101.5 kg)  03/17/20 223 lb (101.2 kg)  03/11/20 224 lb (101.6 kg)    General: Well nourished, well developed, in no acute distress Heart: Atraumatic, normal size  Eyes: PEERLA, EOMI  Neck: Supple, no JVD Endocrine: No thryomegaly Cardiac: Normal S1, S2; RRR;  no murmurs, rubs, or gallops Lungs: Clear to auscultation bilaterally, no wheezing, rhonchi or rales  Abd: Soft, nontender, no hepatomegaly  Ext: No edema, pulses 2+ Musculoskeletal: No deformities, BUE and BLE strength normal and equal Skin: Warm and dry, no rashes   Neuro: Alert and oriented to person, place, time, and situation, CNII-XII grossly intact, no focal deficits  Psych: Normal mood and affect   ASSESSMENT:   Casey Brennan is a 59 y.o. male who presents for the following: 1. Other chest  pain   2. Primary hypertension   3. Mixed hyperlipidemia   4. Gastroesophageal reflux disease without esophagitis     PLAN:   1. Other chest pain -Atypical chest pain.  Coronary calcium score of 0.  Normal coronary arteries on recent coronary CTA.  This is noncardiac chest pain.  I suspect this is acid reflux.  I recommended Nexium 40 mg daily.  He will stop Prilosec.  Echocardiogram in 2018 was normal with normal EF.  His cardiovascular exam is normal.  This is noncardiac chest pain.  No further work-up indicated.  2. Primary hypertension -Blood pressure 122/90.  Is not taking any medications.  Apparently has several allergies.  I have instructed him to restart his metoprolol 25 mg succinate daily.  I recommended a blood pressure log given his normal blood pressure today.  I also recommended salt reduction strategies.  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  Given that his blood pressure is normal he may be able to treat this conservatively.  We  did discuss lisinopril may be an option.  He is never tried this medication.  3. Mixed hyperlipidemia -Continue Lipitor.  Most recent LDL cholesterol was 82.  This is likely acceptable given his coronary calcium score of 0.  I think a goal less than 100 will be good for him.  4. Gastroesophageal reflux disease without esophagitis -Stop Prilosec.  Add Nexium.   Disposition: Return in about 3 months (around 07/09/2020).  Medication Adjustments/Labs and Tests Ordered: Current medicines are reviewed at length with the patient today.  Concerns regarding medicines are outlined above.  No orders of the defined types were placed in this encounter.  Meds ordered this encounter  Medications  . metoprolol succinate (TOPROL-XL) 25 MG 24 hr tablet    Sig: Take 1 tablet (25 mg total) by mouth daily.    Dispense:  90 tablet    Refill:  1  . esomeprazole (NEXIUM) 40 MG capsule    Sig: Take 1 capsule (40 mg total) by  mouth daily at 12 noon.    Dispense:  90 capsule    Refill:  1    Patient Instructions  Medication Instructions:  Start Metoprolol Succinate 25 mg daily  Stop Prilosec Start Nexium 40 mg daily  Take Torsemide 20 mg daily as needed   *If you need a refill on your cardiac medications before your next appointment, please call your pharmacy*  Follow-Up: At [facility], you and your health needs are our priority.  As part of our continuing mission to provide you with exceptional heart care, we have created designated Provider Care Teams.  These Care Teams include your primary Cardiologist (physician) and Advanced Practice Providers (APPs -  Physician Assistants and Nurse Practitioners) who all work together to provide you with the care you need, when you need it.  We recommend signing up for the patient portal called "MyChart".  Sign up information is provided  on this After Visit Summary.  MyChart is used to connect with patients for Virtual Visits (Telemedicine).  Patients are able to view lab/test results, encounter notes, upcoming appointments, etc.  Non-urgent messages can be sent to your provider as well.   To learn more about what you can do with MyChart, go to [email or web address].    Your next appointment:   3 month(s)  The format for your next appointment:   In Person  Provider:   Eleonore Chiquito, MD   Other Instructions Take BP log- check and call with concerns.        Time Spent with Patient: I have spent a total of 35 minutes with patient reviewing hospital notes, telemetry, EKGs, labs and examining the patient as well as establishing an assessment and plan that was discussed with the patient.  > 50% of time was spent in direct patient care.  Signed, Addison Naegeli. Audie Box, [place]  [address] [place],  [address] [phone]  04/08/2020 9:19 AM

## 2020-04-08 ENCOUNTER — Other Ambulatory Visit: Payer: Self-pay | Admitting: Infectious Diseases

## 2020-04-08 ENCOUNTER — Encounter: Payer: Self-pay | Admitting: Cardiovascular Disease

## 2020-04-08 ENCOUNTER — Ambulatory Visit (INDEPENDENT_AMBULATORY_CARE_PROVIDER_SITE_OTHER): Payer: Medicare Other | Admitting: Cardiovascular Disease

## 2020-04-08 ENCOUNTER — Telehealth: Payer: Self-pay | Admitting: *Deleted

## 2020-04-08 VITALS — BP 122/90 | HR 87 | Temp 96.4°F | Ht 69.0 in | Wt 223.8 lb

## 2020-04-08 DIAGNOSIS — I10 Essential (primary) hypertension: Secondary | ICD-10-CM

## 2020-04-08 DIAGNOSIS — E782 Mixed hyperlipidemia: Secondary | ICD-10-CM | POA: Diagnosis not present

## 2020-04-08 DIAGNOSIS — R0789 Other chest pain: Secondary | ICD-10-CM | POA: Diagnosis not present

## 2020-04-08 DIAGNOSIS — K219 Gastro-esophageal reflux disease without esophagitis: Secondary | ICD-10-CM | POA: Diagnosis not present

## 2020-04-08 MED ORDER — METOPROLOL SUCCINATE ER 25 MG PO TB24
25.0000 mg | ORAL_TABLET | Freq: Every day | ORAL | 1 refills | Status: DC
Start: 2020-04-08 — End: 2020-05-03

## 2020-04-08 MED ORDER — ESOMEPRAZOLE MAGNESIUM 40 MG PO CPDR: 40.0000 mg | DELAYED_RELEASE_CAPSULE | Freq: Every day | ORAL | 1 refills | Status: DC

## 2020-04-08 NOTE — Patient Instructions (Signed)
Medication Instructions:  Start Metoprolol Succinate 25 mg daily  Stop Prilosec Start Nexium 40 mg daily  Take Torsemide 20 mg daily as needed   *If you need a refill on your cardiac medications before your next appointment, please call your pharmacy*  Follow-Up: At [facility], you and your health needs are our priority.  As part of our continuing mission to provide you with exceptional heart care, we have created designated Provider Care Teams.  These Care Teams include your primary Cardiologist (physician) and Advanced Practice Providers (APPs -  Physician Assistants and Nurse Practitioners) who all work together to provide you with the care you need, when you need it.  We recommend signing up for the patient portal called "MyChart".  Sign up information is provided on this After Visit Summary.  MyChart is used to connect with patients for Virtual Visits (Telemedicine).  Patients are able to view lab/test results, encounter notes, upcoming appointments, etc.  Non-urgent messages can be sent to your provider as well.   To learn more about what you can do with MyChart, go to [email or web address].    Your next appointment:   3 month(s)  The format for your next appointment:   In Person  Provider:   Eleonore Chiquito, MD   Other Instructions Take BP log- check and call with concerns.

## 2020-04-08 NOTE — Telephone Encounter (Signed)
Patient's sister called to request a new referral to Dr Einar Gip, [facility]. She did not give a reason why. Will route to provider. Landis Gandy, RN

## 2020-04-12 ENCOUNTER — Ambulatory Visit (HOSPITAL_COMMUNITY): Payer: Medicare Other

## 2020-04-15 ENCOUNTER — Telehealth: Payer: Self-pay

## 2020-04-15 NOTE — Telephone Encounter (Signed)
Dr. [name] recommened that this patient re-establish care with you and the patient has not been seen by you since 2002. Would you be interested in re-establishing care?

## 2020-04-15 NOTE — Telephone Encounter (Signed)
Ok with me 

## 2020-04-16 NOTE — Telephone Encounter (Signed)
Pt has an appointment to come in on 05/13/2020 to re-establish care.

## 2020-04-28 ENCOUNTER — Telehealth: Payer: Self-pay | Admitting: Neurology

## 2020-04-28 NOTE — Telephone Encounter (Signed)
Pt's daughter, April Landley (on [place]) called, he's having tingling and numbness in his feet. I have scheduled an appt for 07/08/20, but he needs a earlier appt. Can you work him in any time sooner. Would like a call from the nurse.

## 2020-04-29 ENCOUNTER — Telehealth: Payer: Self-pay | Admitting: Internal Medicine

## 2020-04-29 ENCOUNTER — Encounter: Payer: Self-pay | Admitting: Neurology

## 2020-04-29 ENCOUNTER — Telehealth: Payer: Self-pay | Admitting: Pulmonary Disease

## 2020-04-29 MED ORDER — PREDNISONE 10 MG PO TABS: ORAL_TABLET | ORAL | 0 refills | Status: DC

## 2020-04-29 MED ORDER — AZITHROMYCIN 250 MG PO TABS: 250.0000 mg | ORAL_TABLET | ORAL | 0 refills | Status: DC

## 2020-04-29 NOTE — Telephone Encounter (Signed)
Spoke with the pt's sister  Rxs sent to pharm  He is scheduled with [facility] 05/21/20 since [facility] does not have openings

## 2020-04-29 NOTE — Telephone Encounter (Signed)
I have attempted both the patient and sister all day multiple times with no success. I have contacted the other sister Ms. Williams listed on DPR and was able to LVM advising her to reach out and have one of them call our office back.  **Who ever calls back I have a 11/2 10 am on hold for the patient that can be offered.

## 2020-04-29 NOTE — Telephone Encounter (Signed)
zpak  Prednisone 10 mg take  4 each am x 2 days,   2 each am x 2 days,  1 each am x 2 days and stop   Ov w/in 2 weeks with all meds in hand to re-establish with me or Elsworth Soho

## 2020-04-29 NOTE — Telephone Encounter (Signed)
Spoke with pt's sister, April. Advised her that we would not be able to give him cough syrup without him being seen. She has been advised again of MW's recommendations from the previous phone note from today. Nothing further was needed.

## 2020-04-29 NOTE — Telephone Encounter (Signed)
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   Last avs recs: Plan A = Automatic = Dulera 200 Take 2 puffs first thing in am and then another 2 puffs about 12 hours later.   Work on inhaler technique:  relax and gently blow all the way out then take a nice smooth deep breath back in, triggering the inhaler at same time you start breathing in.  Hold for up to 5 seconds if you can. Blow out thru nose. Rinse and gargle with water when done     Plan B = Backup Only use your albuterol inhaler(Ventolin)  as a rescue medication to be used if you can't catch your breath by resting or doing a relaxed purse lip breathing pattern.  - The less you use it, the better it will work when you need it. - Ok to use the inhaler up to 2 puffs  every 4 hours if you must but call for appointment if use goes up over your usual need - Don't leave home without it !!  (think of it like the spare tire for your car)   Plan C = Crisis - only use your albuterol nebulizer if you first try Plan B and it fails to help > ok to use the nebulizer up to every 4 hours but if start needing it regularly call for immediate appointment   For cough > Take  delsym 1-2 tsp every 12 hours as needed (stop tussionex)    Ok to  return to work 10/23/2018 without restrictions   Please schedule a follow up visit in 3 months but call sooner if needed for pfts on return with inhalers in hand

## 2020-04-29 NOTE — Telephone Encounter (Signed)
Spoke with the pt's sister  rxs sent to pharm for pred and zpack  Nothing further needed

## 2020-04-29 NOTE — Telephone Encounter (Signed)
Attempted to call both Casey Brennan and the patient and "all circuts were busy". I will send a mychart message to assess if they are able to come in on Tuesday for an apt for the patient's concern.

## 2020-04-30 ENCOUNTER — Other Ambulatory Visit: Payer: Self-pay | Admitting: Infectious Diseases

## 2020-04-30 DIAGNOSIS — J328 Other chronic sinusitis: Secondary | ICD-10-CM

## 2020-04-30 MED ORDER — HYDROCOD POLST-CPM POLST ER 10-8 MG/5ML PO SUER: 5.0000 mL | Freq: Two times a day (BID) | ORAL | 0 refills | Status: DC | PRN

## 2020-04-30 NOTE — Telephone Encounter (Signed)
Spoke with sister regarding patient's cough and pulmonogist recommendations. Sister is requesting tussinex cough syrup.  Routing message to Dr. [name] for advise.  Casey Brennan

## 2020-05-03 ENCOUNTER — Other Ambulatory Visit: Payer: Self-pay

## 2020-05-03 ENCOUNTER — Encounter: Payer: Self-pay | Admitting: Cardiology

## 2020-05-03 ENCOUNTER — Ambulatory Visit: Payer: Medicare Other | Admitting: Cardiology

## 2020-05-03 VITALS — BP 162/108 | HR 102 | Ht 69.0 in | Wt 229.0 lb

## 2020-05-03 DIAGNOSIS — I10 Essential (primary) hypertension: Secondary | ICD-10-CM

## 2020-05-03 MED ORDER — METOPROLOL SUCCINATE ER 50 MG PO TB24: 50.0000 mg | ORAL_TABLET | Freq: Every day | ORAL | 2 refills | Status: DC

## 2020-05-03 NOTE — Progress Notes (Signed)
Patient referred by Campbell Riches, MD for hypertension  Subjective:   Casey Brennan, male    DOB: Jul 07, 1960, 59 y.o.   MRN: [id number]   Chief Complaint  Patient presents with  . Hypertension  . New Patient (Initial Visit)     HPI  59 year old African-American male with hypertension, hyperlipidemia, HIV.  Patient was recently seen by [facility] Dr. Eleonore Chiquito.  Prior to that, he was also seen by Drs. Harwani and [place].  His prior cardiac work-up showed coronary CTA with normal coronary arteries and calcium score 0.  He has not tolerated several antihypertensive medications in the past, including amlodipine, HCTZ, losartan, carvedilol. He is tolerating metoprolol succinate.  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 H uses Aspirin 325 mg on M,W,F during his trips.      Past Medical History:  Diagnosis Date  . Arthritis    r knee   . Asthma    very rare  . Axillary lymphadenopathy   . Bronchitis   . Chronic anemia    normocytic  . Chronic folliculitis   . Elevated PSA   . Environmental and seasonal allergies   . GERD (gastroesophageal reflux disease)    Patient denies  . Gross hematuria   . H/O pericarditis    2010--  myopercarditis--  resolved  . HCAP (healthcare-associated pneumonia) 11/19/2014  . Headache(784.0)    HX SEVERE FRONTAL HA'S  . Hiatal hernia   . History of concussion    2012  &  2013  RESIDUAL HA'S --  RESOLVED  . History of gastric ulcer   . History of kidney stones   . History of MRSA infection 2010   infected boil  . HIV (human immunodeficiency virus infection) ([place]) 1988  . Hypertension   . Internal hemorrhoids   . Lytic bone lesion of hip    WORK-UP DONE BY ONCOLOGIST DR HA --  NOT MALIGNANT  . 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 Pneumonia    hx of  . Post concussion syndrome    resolved  . Prostate cancer ([place]) 04/25/13   gleason 3+3=6, volume 30 gm  . Sinusitis, chronic  05/14/2015  . Ulcer    hx of gastric  . Wears glasses      Past Surgical History:  Procedure Laterality Date  . CARDIAC CATHETERIZATION  03-23-2009  DR COOPER   NORMAL CORONARY ARTERIES  . CHOLECYSTECTOMY N/A 11/06/2014   Procedure: LAPAROSCOPIC CHOLECYSTECTOMY WITH INTRAOPERATIVE CHOLANGIOGRAM;  Surgeon: Judeth Horn, MD;  Location: [facility];  Service: General;  Laterality: N/A;  . COLONOSCOPY  12/26/2011   Procedure: COLONOSCOPY;  Surgeon: Lear Ng, MD;  Location: [facility];  Service: Endoscopy;  Laterality: N/A;  . COLONOSCOPY    . COLONOSCOPY WITH PROPOFOL N/A 07/29/2014   Procedure: COLONOSCOPY WITH PROPOFOL;  Surgeon: Lear Ng, MD;  Location: [facility];  Service: Endoscopy;  Laterality: N/A;  . CYSTOSCOPY N/A 05/18/2018   Procedure: CYSTOSCOPY REMOVAL FOREIGN BODY;  Surgeon: Kathie Rhodes, MD;  Location: [facility];  Service: Urology;  Laterality: N/A;  . CYSTOSCOPY/RETROGRADE/URETEROSCOPY Bilateral 04/25/2013   Procedure: CYSTOSCOPY/ BILATERAL RETROGRADES; BLADDER BIOPSIES;  Surgeon: Alexis Frock, MD;  Location: [facility];  Service: Urology;  Laterality: Bilateral;  . DENTAL EXAMINATION UNDER ANESTHESIA    . ESOPHAGOGASTRODUODENOSCOPY  12/26/2011   Procedure: ESOPHAGOGASTRODUODENOSCOPY (EGD);  Surgeon: Lear Ng, MD;  Location: [facility];  Service: Endoscopy;  Laterality:  N/A;  . ESOPHAGOGASTRODUODENOSCOPY (EGD) WITH PROPOFOL N/A 07/29/2014   Procedure: ESOPHAGOGASTRODUODENOSCOPY (EGD) WITH PROPOFOL;  Surgeon: Lear Ng, MD;  Location: [facility];  Service: Endoscopy;  Laterality: N/A;  . EXCISION CHRONIC LEFT BREAST ABSCESS  09-21-2010  . EXCISIONAL BX LEFT BREAST MASS/  I  &  D LEFT BREAST ABSCESS  03-24-2009  . KNEE ARTHROSCOPY Right 1985  . left axilla biopsy  04/2013  . left hip biopsy  10/2012  . LYMPHADENECTOMY Bilateral 08/18/2013   Procedure: LYMPHADENECTOMY "PELVIC LYMPH NODE DISSECTION";  Surgeon: Alexis Frock, MD;  Location: [facility];  Service: Urology;  Laterality: Bilateral;  . PROSTATE BIOPSY N/A 04/25/2013   Procedure: BIOPSY TRANSRECTAL ULTRASONIC PROSTATE (TUBP);  Surgeon: Alexis Frock, MD;  Location[facility];  Service: Urology;  Laterality: N/A;  . ROBOT ASSISTED LAPAROSCOPIC RADICAL PROSTATECTOMY N/A 08/18/2013   Procedure: ROBOTIC ASSISTED LAPAROSCOPIC RADICAL PROSTATECTOMY;  Surgeon: Alexis Frock, MD;  Location: [facility];  Service: Urology;  Laterality: N/A;  . TOTAL KNEE ARTHROPLASTY Right 08/01/2016   08/17/16  . TOTAL KNEE ARTHROPLASTY Right 08/01/2016   Procedure: TOTAL KNEE ARTHROPLASTY;  Surgeon: Renette Butters, MD;  Location: [facility];  Service: Orthopedics;  Laterality: Right;  . UPPER GASTROINTESTINAL ENDOSCOPY    . UPPER LEG SOFT TISSUE BIOPSY Left 2012   lthigh     Social History   Tobacco Use  Smoking Status Former Smoker  . Packs/day: 1.50  . Years: 23.00  . Pack years: 34.50  . Types: Cigars, Cigarettes  . Quit date: 05/22/2010  . Years since quitting: 9.9  Smokeless Tobacco Never Used    Social History   Substance and Sexual Activity  Alcohol Use Yes  . Alcohol/week: 0.0 standard drinks   Comment: 1-2 drinks a week      Family History  Problem Relation Age of Onset  . Stroke Father   . Diabetes Father   . Cancer Father        brain cancer  . Asthma Father   . Hypertension Sister   . Cancer Maternal Uncle        prostate cancer  . Asthma Sister   . Colon cancer Neg Hx   . Colon polyps Neg Hx   . Esophageal cancer Neg Hx   . Stomach cancer Neg Hx   . Rectal cancer Neg Hx      Current Outpatient Medications on File Prior to Visit  Medication Sig Dispense Refill  . albuterol (VENTOLIN HFA) 108 (90 Base) MCG/ACT inhaler INHALE 2 PUFFS INTO THE LUNGS EVERY 6 HOURS AS NEEDED FOR WHEEZING OR SHORTNESS OF BREATH 36 g 3  . ALPRAZolam (XANAX) 0.5 MG tablet TAKE 1 TABLET BY MOUTH AT BEDTIME 90 tablet 0  . aspirin EC 81 MG tablet  Take 81 mg by mouth every Monday, Wednesday, and Friday.     [name] azithromycin (ZITHROMAX) 250 MG tablet Take 1 tablet (250 mg total) by mouth as directed. 6 tablet 0  . carvedilol (COREG) 25 MG tablet Take 1 tablet (25 mg total) by mouth 2 (two) times daily. (Patient not taking: Reported on 04/08/2020) 180 tablet 3  . chlorpheniramine-HYDROcodone (TUSSIONEX PENNKINETIC ER) 10-8 MG/5ML SUER Take 5 mLs by mouth every 12 (twelve) hours as needed for cough. 50 mL 0  . diclofenac Sodium (VOLTAREN) 1 % GEL APPLY 2 GRAMS EXTERNALLY TO THE AFFECTED AREA TWICE DAILY AS NEEDED FOR PAIN 100 g 0  . EPINEPHrine 0.3 mg/0.3 mL IJ SOAJ injection Inject 0.3  mLs (0.3 mg total) into the muscle as needed for anaphylaxis. 1 each 3  . esomeprazole (NEXIUM) 40 MG capsule Take 1 capsule (40 mg total) by mouth daily at 12 noon. 90 capsule 1  . famotidine (PEPCID) 20 MG tablet Take 1 tablet (20 mg total) by mouth 2 (two) times daily. 10 tablet 0  . gabapentin (NEURONTIN) 300 MG capsule Take 1 capsule (300 mg total) by mouth 4 (four) times daily as needed (pain). 120 capsule 11  . hydrOXYzine (ATARAX/VISTARIL) 25 MG tablet TAKE 1 TABLET(25 MG) BY MOUTH EVERY NIGHT (Patient taking differently: Take 25 mg by mouth at bedtime. ) 90 tablet 3  . ipratropium (ATROVENT) 0.02 % nebulizer solution USE 2.5 ML(0.5 MG) VIA NEBULIZER THREE TIMES DAILY AS NEEDED FOR WHEEZING OR SHORTNESS OF BREATH (Patient taking differently: Take 0.5 mg by nebulization 3 (three) times daily as needed for wheezing or shortness of breath. ) 1650 mL 1  . ISENTRESS 400 MG tablet TAKE 2 TABLETS(800 MG) BY MOUTH AT BEDTIME 60 tablet 5  . meclizine (ANTIVERT) 25 MG tablet Take 1 tablet by mouth 2 (two) times daily.    . metoprolol succinate (TOPROL-XL) 25 MG 24 hr tablet Take 1 tablet (25 mg total) by mouth daily. 90 tablet 1  . mometasone-formoterol (DULERA) 200-5 MCG/ACT AERO INHALE 2 PUFFS BY MOUTH EVERY MORNING AND 2 PUFFS 12 HOURS LATER 13 g 0  . montelukast  (SINGULAIR) 10 MG tablet TAKE 1 TABLET(10 MG) BY MOUTH AT BEDTIME 90 tablet 1  . Multiple Vitamin (MULTIVITAMIN WITH MINERALS) TABS tablet Take 1 tablet by mouth daily after breakfast.     . mupirocin ointment (BACTROBAN) 2 % APPLY EXTERNALLY TO THE AFFECTED AREA TWICE DAILY FOR 3 WEEKS, PLACE COTTON BETWEEN TOES TO ALLOW BETTER AERATION AS NEEDED 66 g 1  . naproxen (NAPROSYN) 500 MG tablet Take 1 tablet by mouth 2 (two) times daily.    [name] NIFEdipine (PROCARDIA XL/NIFEDICAL XL) 60 MG 24 hr tablet Take 1 tablet (60 mg total) by mouth daily. (Patient not taking: Reported on 04/08/2020) 90 tablet 3  . ondansetron (ZOFRAN ODT) 8 MG disintegrating tablet Take 1 tablet (8 mg total) by mouth every 8 (eight) hours as needed for nausea or vomiting. 60 tablet 3  . Polyethyl Glycol-Propyl Glycol (SYSTANE) 0.4-0.3 % GEL ophthalmic gel Place 1 application into both eyes 2 (two) times daily.     . predniSONE (DELTASONE) 10 MG tablet 4 x 2 days, 2 x 2 days, 1 x 2 days, then stop 14 tablet 0  . SUMAtriptan (IMITREX) 100 MG tablet TAKE 1 TABLET BY MOUTH EVERY 2 HOURS AS NEEDED FOR MIGRAINE, MAY REPEAT IN 2 HOURS IF HEADACHE PERSISTS OR RECURS 10 tablet 2  . tizanidine (ZANAFLEX) 6 MG capsule Take 1 capsule (6 mg total) by mouth 3 (three) times daily. 90 capsule 11  . torsemide (DEMADEX) 20 MG tablet Take 20 mg by mouth daily as needed.    . TRUVADA 200-300 MG tablet Take 1 tablet by mouth daily. 30 tablet 5  . [DISCONTINUED] zolpidem (AMBIEN) 5 MG tablet Take 1-2 tablets (5-10 mg total) by mouth at bedtime as needed for sleep. (Patient not taking: Reported on 04/23/2019) 30 tablet 2   No current facility-administered medications on file prior to visit.    Cardiovascular and other pertinent studies:  EKG 05/03/2020: Sinus tachycardia 100 bpm Left atrial enlargement   CCTA 04/07/2020: 1. Coronary calcium score of 0. 2. Normal coronary origin with right dominance. 3.  Normal coronary  arteries. RECOMMENDATIONS: 1. No evidence of CAD (0%). Consider non-atherosclerotic causes of chest pain.   Recent labs: 03/11/2020: Glucose 89.  BUN/creatinine 10/1.19.  GFR N/A. Na/K 139/3.9.  Rest of the CMP normal. H/H 14/43. MCV 96.  Platelets 169. Cholesterol 153, triglycerides 92, HDL 52, LDL 82.    Review of Systems  Cardiovascular: Positive for dyspnea on exertion (Mild). Negative for chest pain, leg swelling, palpitations and syncope.         Vitals:   05/03/20 1340  BP: (!) 162/108  Pulse: (!) 102  SpO2: 98%     Body mass index is 33.82 kg/m. Filed Weights   05/03/20 1340  Weight: 229 lb (103.9 kg)     Objective:   Physical Exam Vitals and nursing note reviewed.  Constitutional:      General: He is not in acute distress. Neck:     Vascular: No JVD.  Cardiovascular:     Rate and Rhythm: Normal rate and regular rhythm.     Heart sounds: Normal heart sounds. No murmur heard.   Pulmonary:     Effort: Pulmonary effort is normal.     Breath sounds: Normal breath sounds. No wheezing or rales.          Assessment & Recommendations:    59 year old African-American male with hypertension, hyperlipidemia, COPD, HIV.  Hypertension: Tolerating metoprolol succinate. Increase to 50 mg. Obtain echocardiogram. No other cardiac workup necessary at this time.  Follow up in few weeks to up titrate metoprolol succinate.     Thank you for referring the patient to [place]. Please feel free to contact with any questions.   Nigel Mormon, MD Pager: [phone] Office: [phone]

## 2020-05-03 NOTE — Telephone Encounter (Signed)
Patient's sister called back. The prescription did not go through.  Unable to call in controlled substance.  Will need to have this resent to [facility] by prescriber. Patient currently out of town, ok to wait until Dr [name] is back. Landis Gandy, RN

## 2020-05-04 ENCOUNTER — Encounter: Payer: Self-pay | Admitting: Neurology

## 2020-05-04 ENCOUNTER — Other Ambulatory Visit: Payer: Self-pay | Admitting: Infectious Diseases

## 2020-05-04 ENCOUNTER — Ambulatory Visit (INDEPENDENT_AMBULATORY_CARE_PROVIDER_SITE_OTHER): Payer: Medicare Other | Admitting: Neurology

## 2020-05-04 DIAGNOSIS — B20 Human immunodeficiency virus [HIV] disease: Secondary | ICD-10-CM | POA: Diagnosis not present

## 2020-05-04 DIAGNOSIS — G4700 Insomnia, unspecified: Secondary | ICD-10-CM | POA: Diagnosis not present

## 2020-05-04 DIAGNOSIS — J328 Other chronic sinusitis: Secondary | ICD-10-CM

## 2020-05-04 DIAGNOSIS — G43009 Migraine without aura, not intractable, without status migrainosus: Secondary | ICD-10-CM

## 2020-05-04 DIAGNOSIS — G63 Polyneuropathy in diseases classified elsewhere: Secondary | ICD-10-CM

## 2020-05-04 MED ORDER — ALPRAZOLAM 0.5 MG PO TABS: 0.5000 mg | ORAL_TABLET | Freq: Two times a day (BID) | ORAL | 1 refills | Status: DC | PRN

## 2020-05-04 MED ORDER — GABAPENTIN 300 MG PO CAPS: 300.0000 mg | ORAL_CAPSULE | Freq: Four times a day (QID) | ORAL | 11 refills | Status: DC | PRN

## 2020-05-04 MED ORDER — HYDROCOD POLST-CPM POLST ER 10-8 MG/5ML PO SUER: 5.0000 mL | Freq: Two times a day (BID) | ORAL | 0 refills | Status: DC | PRN

## 2020-05-04 MED ORDER — SUMATRIPTAN SUCCINATE 100 MG PO TABS: ORAL_TABLET | ORAL | 2 refills | Status: DC

## 2020-05-04 NOTE — Patient Instructions (Signed)
How to Care for Yourself When You Have HIV Taking good care of yourself is very important when you are living with HIV (human immunodeficiency virus). There are many things that you can do to stay as healthy as possible and prevent HIV from progressing to AIDS (acquired immunodeficiency syndrome). Follow these instructions at home: Medicines, herbs, and supplements  Make sure you understand your medicine plan.  Take over-the-counter and prescription medicines only as told by your health care provider. Take your medicines the right way, every day, regardless of how long you have had the virus.  Do not take any medicines, herbal remedies, or dietary supplements without your health care provider's knowledge and approval. Diet and exercise  Eat a healthy diet that includes fruits and vegetables, whole grains, and lean proteins.  If you lose weight without trying, ask your health care provider whether you should take a protein or calorie supplement daily.  Drink enough fluid to keep your urine pale yellow.  Take steps to lower your risk of getting an illness that spreads through germs in food (foodborne illness): ? Do not eat or drink unpasteurized dairy products. ? Do not drink fruit juices. ? Do not eat raw seafood. ? Always cook eggs thoroughly. ? Make sure your meat is cooked until you cannot see any pink. ? Do not drink water that may contain human or animal waste, such as from a lake or river. Do not swallow water while swimming.  Follow your health care provider's recommendations for participating in healthy exercise. Sexual activity      Avoid risky sexual behaviors and limit the number of sexual partners you have.  Use condoms correctly every time you engage in sexual activity that may expose you or your partner to bodily fluids.  Talk to your health care provider about other ways to protect your sexual partners. Health care visits  Get all recommended lab tests.  Keep  your vaccinations up to date. Get the flu vaccine every year.  If you wish to become pregnant, talk with your health care provider.  Ask to be checked for STIs (sexually transmitted infections).  Keep all follow-up visits as told by your health care provider. This is important. General instructions  Understand what germs you may be exposed to at work, at home, and in the community. Try to limit your exposure to these germs.  If you have a pet, such as a cat or bird, ask a friend or family member to change your cat's litter box or clean your birdcage. Waste from certain pets may be potentially harmful to you.  Try to avoid contact with people who may have an illness that spreads easily from person to person (is contagious).  Do not share drug injection equipment.  Consider joining a support group for people living with HIV. Contact a health care provider if:  You develop a cough.  You are short of breath.  You have a fever.  You have diarrhea.  You have nausea or vomiting.  You are bruising or bleeding easily.  You have a new skin growth or an existing mole that changes in size, color, or shape.  You find out that you are pregnant.  You have a headache that does not get better with medicine.  You are losing weight without trying.  You feel weak.  You have flu-like symptoms, such as: ? Achiness. ? Weakness. ? Increased tiredness (fatigue). Get help right away if:  There is blood in your stool or vomit.  You cough up blood.  You have chest pain.  You have abdominal pain.  You have shaking chills.  You have a severe headache.  You have trouble thinking clearly or you feel confused.  You have jerking movements or stiffening of your arms and legs that you cannot control (seizure).  You pass out. Summary  Take over-the-counter and prescription medicines only as told by your health care provider. Take your medicines the right way, every day, regardless of  how long you have had the virus.  Visit your health care provider regularly.  It is important to make choices that keep you healthy and protect others. This information is not intended to replace advice given to you by your health care provider. Make sure you discuss any questions you have with your health care provider. Document Revised: 09/15/2017 Document Reviewed: 09/29/2016 Elsevier Patient Education  [place].
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
## 2020-05-06 ENCOUNTER — Encounter: Payer: Self-pay | Admitting: *Deleted

## 2020-05-06 ENCOUNTER — Ambulatory Visit: Payer: Medicare Other

## 2020-05-06 ENCOUNTER — Other Ambulatory Visit: Payer: Self-pay

## 2020-05-06 ENCOUNTER — Telehealth: Payer: Self-pay | Admitting: Pulmonary Disease

## 2020-05-06 DIAGNOSIS — I10 Essential (primary) hypertension: Secondary | ICD-10-CM

## 2020-05-06 DIAGNOSIS — I428 Other cardiomyopathies: Secondary | ICD-10-CM

## 2020-05-06 NOTE — Telephone Encounter (Signed)
Called and spoke with pt's sister  April stating to her that pt needs to be seen sooner since no better after meds. April verbalized understanding. Pt has had covid vaccines and I stated to her for pt to bring vaccine card. I have moved pt's appt up. Nothing further needed.

## 2020-05-06 NOTE — Progress Notes (Signed)
error 

## 2020-05-11 ENCOUNTER — Ambulatory Visit (INDEPENDENT_AMBULATORY_CARE_PROVIDER_SITE_OTHER): Payer: Medicare Other | Admitting: Pulmonary Disease

## 2020-05-11 ENCOUNTER — Other Ambulatory Visit: Payer: Self-pay

## 2020-05-11 ENCOUNTER — Encounter: Payer: Self-pay | Admitting: Pulmonary Disease

## 2020-05-11 ENCOUNTER — Telehealth: Payer: Self-pay

## 2020-05-11 ENCOUNTER — Telehealth: Payer: Self-pay | Admitting: Pulmonary Disease

## 2020-05-11 ENCOUNTER — Ambulatory Visit (INDEPENDENT_AMBULATORY_CARE_PROVIDER_SITE_OTHER): Payer: Medicare Other

## 2020-05-11 VITALS — BP 130/80 | HR 84 | Temp 97.7°F | Ht 69.0 in | Wt 231.4 lb

## 2020-05-11 DIAGNOSIS — J449 Chronic obstructive pulmonary disease, unspecified: Secondary | ICD-10-CM

## 2020-05-11 DIAGNOSIS — R059 Cough, unspecified: Secondary | ICD-10-CM | POA: Diagnosis not present

## 2020-05-11 DIAGNOSIS — Z0000 Encounter for general adult medical examination without abnormal findings: Secondary | ICD-10-CM

## 2020-05-11 DIAGNOSIS — B20 Human immunodeficiency virus [HIV] disease: Secondary | ICD-10-CM | POA: Diagnosis not present

## 2020-05-11 MED ORDER — BENZONATATE 200 MG PO CAPS: 200.0000 mg | ORAL_CAPSULE | Freq: Three times a day (TID) | ORAL | 1 refills | Status: DC | PRN

## 2020-05-11 MED ORDER — QNASL 80 MCG/ACT NA AERS: 2.0000 | INHALATION_SPRAY | Freq: Every day | NASAL | 0 refills | Status: DC

## 2020-05-11 MED ORDER — LEVOFLOXACIN 500 MG PO TABS: 500.0000 mg | ORAL_TABLET | Freq: Every day | ORAL | 0 refills | Status: DC

## 2020-05-11 NOTE — Assessment & Plan Note (Signed)
Plan: Recommend seasonal flu vaccine, patient declined Patient up-to-date with first 2 injections of COVID-19 vaccination

## 2020-05-11 NOTE — Assessment & Plan Note (Signed)
Patient currently on Tussionex for management of cough.  He received a 5-day prescription refill earlier this month as well as a 7-day prescription refill from urgent care.  He has a total of 12 days.  Requesting refill today on day 8.  I discussed with patient as well as sister is here with him that we will not be refilling this.  He does appear that infectious disease has prescribed this in the past occasionally as patient reports allergies to over-the-counter cough medications.  Plan: Chest x-ray today to further evaluate current symptoms We will not refill Tussionex We will send in Tessalon cough Perles Sputum culture today to further investigate productive cough with discolored sputum as patient is already status post a Z-Pak and doxycycline within the last 30 days

## 2020-05-11 NOTE — Telephone Encounter (Signed)
Please contact the patient let him know.  Please also send in prescription for:  Levaquin 500mg  tablet >>> Take 1 500 mg tablet daily for the next 7 days >>> Take with food >>> start probiotic for good gut health >>>avoid rigorous exercise for next 2 weeks   Patient should plan on repeat chest x-ray in 6 to 8 weeks with our office  Casey Quaker, FNP

## 2020-05-11 NOTE — Progress Notes (Signed)
See telephone note regarding this message.  Patient treated with Levaquin.  Patient has already been contacted.  Nothing further needed at this time. Warner Mccreedy FNP

## 2020-05-11 NOTE — Progress Notes (Signed)
@Patient  ID: Casey Brennan, male    DOB: 12/24/1960, 59 y.o.   MRN: [id number]  Chief Complaint  Patient presents with  . Follow-up    COPD, doing ok    Referring provider: Campbell Riches, MD  HPI:  59 year old male former smoker found our office for COPD  PMH: Osteoarthritis, hypertension, HIV, kidney stones Smoker/ Smoking History: Former smoker.  Quit 2011.  34.5-pack-year smoking history. Maintenance: Dulera 200 Pt of: Dr. Melvyn Novas  05/11/2020  - Visit   59 year old male former smoker found our office for COPD.  Last seen in our office in April/2020 by Dr. Melvyn Novas.  Patient recently seen in urgent care and treated for an acute bronchitis.  He was treated with doxycycline.  Patient still having ongoing symptoms of cough.  Patient is vaccinated [id number].  He is also established with infectious disease Dr. [name] for management of his HIV.  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  Patient is still having ongoing symptoms of cough.  They are requesting a refill of patient's Tussionex today.  It appears previously patient had been treated with Tussionex cough syrup on 05/03/2020 we received 7 days of this.  Patient also recently seen an additional 5 days from Dr. [name] on 05/04/2020.  Patient reporting ongoing productive cough with congestion.  He was treated on 04/29/2020 with a Z-Pak and prednisone taper telephonically from our office.  Patient was then seen by an urgent care provider on 05/03/2020 and diagnosed with pneumonia without a chest x-ray.  Treated with doxycycline.  Patient continues to utilize Mucinex.  He continues to feel fatigued.  Denies recent fevers.  Sister who is with patient today reports that he potentially may have had a fever 2 to 3 days ago.  They do not have the recorded temperature.  Previously was established with Dr. Melvyn Novas.  He is planning on establishing with Dr. Elsworth Soho.  Patient presenting today as follow-up as he continues to not feel well.  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  Questionaires / Pulmonary Flowsheets:   ACT:  No flowsheet data found.  MMRC: mMRC Dyspnea Scale mMRC Score  05/11/2020 3    Epworth:  No flowsheet data found.  Tests:   FENO:  No results found for: NITRICOXIDE  PFT: No flowsheet data found.  WALK:  SIX MIN WALK 07/22/2014  Supplimental Oxygen during Test? (L/min) No  Tech Comments: normal pace/no SOB/stopped due to "feel too tired"//lmr    Imaging: PCV ECHOCARDIOGRAM COMPLETE  Result Date: 05/08/2020 Echocardiogram 05/06/2020: Moderately depressed LV systolic function with EF 37%. Left ventricle cavity is normal in size. Moderate concentric hypertrophy of the left ventricle. Hypokinetic global wall motion. Doppler evidence of grade I (impaired) diastolic dysfunction, normal LAP. Calculated EF 37%. Structurally normal mitral valve.  Mild (Grade I) mitral regurgitation. Mild pulmonic regurgitation.   Lab Results:  CBC    Component Value Date/Time   WBC 4.3 03/11/2020 1540   RBC 4.50 03/11/2020 1540   HGB 14.5 03/11/2020 1540   HGB 14.0 05/11/2015 1108   HCT 43.2 03/11/2020 1540   HCT 42.1 05/11/2015 1108   PLT 169 03/11/2020 1540   PLT 126 (L) 05/11/2015 1108   MCV 96.0 03/11/2020 1540   MCV 97.5 05/11/2015 1108   MCH 32.2 03/11/2020 1540   MCHC 33.6 03/11/2020 1540   RDW 14.0 03/11/2020 1540   RDW 12.9 05/11/2015 1108   LYMPHSABS 1,990 12/04/2018 1543   LYMPHSABS  1.5 05/11/2015 1108   MONOABS 0.6 04/26/2017 2053   MONOABS 0.3 05/11/2015 1108   EOSABS 322 12/04/2018 1543   EOSABS 0.6 (H) 05/11/2015 1108   BASOSABS 47 12/04/2018 1543   BASOSABS 0.0 05/11/2015 1108    BMET    Component Value Date/Time   NA 141 03/11/2020 1540   NA 142 02/24/2013 0848   K 3.9 03/11/2020 1540   K 3.4 (L) 02/24/2013 0848   CL 107 03/11/2020 1540   CO2 26 03/11/2020 1540   CO2 24 02/24/2013 0848   GLUCOSE 89 03/11/2020 1540   GLUCOSE 121 02/24/2013 0848    GLUCOSE 93 03/25/2009 0000   BUN 10 03/11/2020 1540   BUN 11.9 02/24/2013 0848   CREATININE 1.19 03/11/2020 1540   CREATININE 1.3 02/24/2013 0848   CALCIUM 9.0 03/11/2020 1540   CALCIUM 8.9 02/24/2013 0848   GFRNONAA 58 (L) 05/08/2019 0504   GFRNONAA 70 12/04/2018 1543   GFRAA >60 05/08/2019 0504   GFRAA 82 12/04/2018 1543    BNP    Component Value Date/Time   BNP 7.2 11/11/2014 1130    ProBNP No results found for: PROBNP  Specialty Problems      Pulmonary Problems   Asthma   COPD GOLD III    Followed in Pulmonary clinic/ [facility]/ [facility] Quit smoking 2011 - spirometry 07/22/2014  FEV1  1.12 (34%) ratio 59  - 07/22/2014  Walked RA  2 laps @ 185 ft each stopped due to  Too tired to complete, no sob or tachypnea/ no desats at nl pace  - 07/22/2014  try dulera 200   2bid  - 10/17/2018  After extensive coaching inhaler device,  effectiveness =    75% > continue dulera 200 2bid            Cough   Sinusitis, chronic      Allergies  Allergen Reactions  . Chocolate Shortness Of Breath and Other (See Comments)    Asthma attack, welts  . Descovy [Emtricitabine-Tenofovir Af] Shortness Of Breath    Nasuea, vomiiting, diarrhea, sob, whelps, throat closes  . Genvoya [Elviteg-Cobic-Emtricit-Tenofaf] Shortness Of Breath    Nausea, vomitting, diarreha, sob, rash, throat closing  . Morphine And Related Shortness Of Breath, Itching and Other (See Comments)     chest pain  . Penicillins Shortness Of Breath and Rash    Has patient had a PCN reaction causing immediate rash, facial/tongue/throat swelling, SOB or lightheadedness with hypotension: Yes Has patient had a PCN reaction causing severe rash involving mucus membranes or skin necrosis: No Has patient had a PCN reaction that required hospitalization No Has patient had a PCN reaction occurring within the last 10 years: Yes If all of the above answers are "NO", then may proceed with Cephalosporin use.  . Sulfa Antibiotics  Anaphylaxis  . Tomato Shortness Of Breath  . Vancomycin Shortness Of Breath  . Carvedilol     LEG SWELLING, DIZZNESS  . Flagyl [Metronidazole] Itching  . Milk-Related Compounds   . Toradol [Ketorolac Tromethamine] Nausea And Vomiting    itching  . Tramadol Itching and Nausea And Vomiting    Immunization History  Administered Date(s) Administered  . Hepatitis A 06/12/2011  . Influenza Split 06/12/2012  . Influenza Whole 04/19/2009, 07/25/2010, 02/22/2011  . Influenza,inj,Quad PF,6+ Mos 06/04/2013, 04/27/2014, 07/21/2015, 05/22/2016, 04/27/2017  . Influenza-Unspecified 01/30/2015  . Meningococcal Mcv4o 05/22/2016  . PFIZER SARS-COV-2 Vaccination 10/23/2019, 11/13/2019  . PPD Test 05/05/2009  . Pneumococcal Polysaccharide-23 03/22/2009, 08/19/2013  . Tdap  06/12/2011    Past Medical History:  Diagnosis Date  . Arthritis    r knee   . Asthma    very rare  . Axillary lymphadenopathy   . Bronchitis   . Chronic anemia    normocytic  . Chronic folliculitis   . Elevated PSA   . Environmental and seasonal allergies   . GERD (gastroesophageal reflux disease)    Patient denies  . Gross hematuria   . H/O pericarditis    2010--  myopercarditis--  resolved  . HCAP (healthcare-associated pneumonia) 11/19/2014  . Headache(784.0)    HX SEVERE FRONTAL HA'S  . Hiatal hernia   . History of concussion    2012  &  2013  RESIDUAL HA'S --  RESOLVED  . History of gastric ulcer   . History of kidney stones   . History of MRSA infection 2010   infected boil  . HIV (human immunodeficiency virus infection) ([place]) 1988  . Hypertension   . Internal hemorrhoids   . Lytic bone lesion of hip    WORK-UP DONE BY ONCOLOGIST DR HA --  NOT MALIGNANT  . 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 Pneumonia    hx of  . Post concussion syndrome    resolved  . Prostate cancer ([place]) 04/25/13   gleason 3+3=6, volume 30 gm  . Sinusitis, chronic 05/14/2015  . Ulcer    hx of gastric  . Wears glasses      Tobacco History: Social History   Tobacco Use  Smoking Status Former Smoker  . Packs/day: 1.50  . Years: 23.00  . Pack years: 34.50  . Types: Cigars, Cigarettes  . Quit date: 05/22/2010  . Years since quitting: 9.9  Smokeless Tobacco Never Used   Counseling given: Yes   Continue to not smoke  Outpatient Encounter Medications as of 05/11/2020  Medication Sig  . albuterol (VENTOLIN HFA) 108 (90 Base) MCG/ACT inhaler INHALE 2 PUFFS INTO THE LUNGS EVERY 6 HOURS AS NEEDED FOR WHEEZING OR SHORTNESS OF BREATH  . ALPRAZolam (XANAX) 0.5 MG tablet Take 1 tablet (0.5 mg total) by mouth 2 (two) times daily as needed for anxiety.  [name] aspirin EC 81 MG tablet Take 81 mg by mouth every Monday, Wednesday, and Friday.   [name] azithromycin (ZITHROMAX) 250 MG tablet Take 1 tablet (250 mg total) by mouth as directed.  . chlorpheniramine-HYDROcodone (TUSSIONEX PENNKINETIC ER) 10-8 MG/5ML SUER Take 5 mLs by mouth every 12 (twelve) hours as needed for cough.  . diclofenac Sodium (VOLTAREN) 1 % GEL APPLY 2 GRAMS EXTERNALLY TO THE AFFECTED AREA TWICE DAILY AS NEEDED FOR PAIN  . EPINEPHrine 0.3 mg/0.3 mL IJ SOAJ injection Inject 0.3 mLs (0.3 mg total) into the muscle as needed for anaphylaxis.  [name] esomeprazole (NEXIUM) 40 MG capsule Take 1 capsule (40 mg total) by mouth daily at 12 noon.  . gabapentin (NEURONTIN) 300 MG capsule Take 1 capsule (300 mg total) by mouth 4 (four) times daily as needed (pain).  . Glucos-Chond-Hyal Ac-Ca Fructo (MOVE FREE JOINT HEALTH ADVANCE PO) Take by mouth daily.  . hydrOXYzine (ATARAX/VISTARIL) 25 MG tablet TAKE 1 TABLET(25 MG) BY MOUTH EVERY NIGHT (Patient taking differently: Take 25 mg by mouth at bedtime. )  . ipratropium (ATROVENT) 0.02 % nebulizer solution USE 2.5 ML(0.5 MG) VIA NEBULIZER THREE TIMES DAILY AS NEEDED FOR WHEEZING OR SHORTNESS OF BREATH (Patient taking differently: Take 0.5 mg by nebulization 3 (three) times daily as needed for wheezing or shortness of breath. )   .  ISENTRESS 400 MG tablet TAKE 2 TABLETS(800 MG) BY MOUTH AT BEDTIME  . metoprolol succinate (TOPROL-XL) 50 MG 24 hr tablet Take 1 tablet (50 mg total) by mouth daily. (Patient taking differently: Take 25 mg by mouth daily. )  . mometasone-formoterol (DULERA) 200-5 MCG/ACT AERO INHALE 2 PUFFS BY MOUTH EVERY MORNING AND 2 PUFFS 12 HOURS LATER  . Multiple Vitamin (MULTIVITAMIN WITH MINERALS) TABS tablet Take 1 tablet by mouth daily after breakfast.   . mupirocin ointment (BACTROBAN) 2 % APPLY EXTERNALLY TO THE AFFECTED AREA TWICE DAILY FOR 3 WEEKS, PLACE COTTON BETWEEN TOES TO ALLOW BETTER AERATION AS NEEDED  . naproxen (NAPROSYN) 500 MG tablet Take 1 tablet by mouth 2 (two) times daily.  . ondansetron (ZOFRAN ODT) 8 MG disintegrating tablet Take 1 tablet (8 mg total) by mouth every 8 (eight) hours as needed for nausea or vomiting.  Vladimir Faster Glycol-Propyl Glycol (SYSTANE) 0.4-0.3 % GEL ophthalmic gel Place 1 application into both eyes 2 (two) times daily.   . SUMAtriptan (IMITREX) 100 MG tablet TAKE 1 TABLET BY MOUTH EVERY 2 HOURS AS NEEDED FOR MIGRAINE, MAY REPEAT IN 2 HOURS IF HEADACHE PERSISTS OR RECURS  . tizanidine (ZANAFLEX) 6 MG capsule Take 1 capsule (6 mg total) by mouth 3 (three) times daily.  [name] torsemide (DEMADEX) 20 MG tablet Take 20 mg by mouth daily as needed.  . TRUVADA 200-300 MG tablet Take 1 tablet by mouth daily.  . [DISCONTINUED] predniSONE (DELTASONE) 10 MG tablet 4 x 2 days, 2 x 2 days, 1 x 2 days, then stop  . benzonatate (TESSALON) 200 MG capsule Take 1 capsule (200 mg total) by mouth 3 (three) times daily as needed for cough.  . [DISCONTINUED] zolpidem (AMBIEN) 5 MG tablet Take 1-2 tablets (5-10 mg total) by mouth at bedtime as needed for sleep. (Patient not taking: Reported on 04/23/2019)   No facility-administered encounter medications on file as of 05/11/2020.     Review of Systems  Review of Systems  Constitutional: Positive for appetite change and fatigue.  Negative for activity change, chills, fever and unexpected weight change.  HENT: Positive for congestion, postnasal drip, rhinorrhea, sinus pressure and sinus pain. Negative for sore throat.   Eyes: Negative.   Respiratory: Positive for cough and shortness of breath. Negative for wheezing.   Cardiovascular: Negative for chest pain and palpitations.  Endocrine: Negative.   Genitourinary: Negative.   Musculoskeletal: Negative.   Skin: Negative.   Neurological: Negative for dizziness and headaches.  Psychiatric/Behavioral: Negative.  Negative for dysphoric mood. The patient is not nervous/anxious.   All other systems reviewed and are negative.    Physical Exam  BP 130/80 (BP Location: Left Arm, Cuff Size: Normal)   Pulse 84   Temp 97.7 F (36.5 C) (Oral)   Ht 5\' 9"  (1.753 m)   Wt 231 lb 6.4 oz (105 kg)   SpO2 96%   BMI 34.17 kg/m   Wt Readings from Last 5 Encounters:  05/11/20 231 lb 6.4 oz (105 kg)  05/04/20 230 lb (104.3 kg)  05/03/20 229 lb (103.9 kg)  04/08/20 223 lb 12.8 oz (101.5 kg)  03/17/20 223 lb (101.2 kg)    BMI Readings from Last 5 Encounters:  05/11/20 34.17 kg/m  05/04/20 33.97 kg/m  05/03/20 33.82 kg/m  04/08/20 33.05 kg/m  03/17/20 32.93 kg/m     Physical Exam Vitals and nursing note reviewed.  Constitutional:      General: He is not in acute distress.  Appearance: Normal appearance. He is obese.  HENT:     Head: Normocephalic and atraumatic.     Right Ear: Hearing, tympanic membrane, ear canal and external ear normal. There is no impacted cerumen.     Left Ear: Hearing, tympanic membrane, ear canal and external ear normal. There is no impacted cerumen.     Nose: No mucosal edema or rhinorrhea.     Right Turbinates: Not enlarged.     Left Turbinates: Not enlarged.     Comments: Tight nares, narrow nasal passages    Mouth/Throat:     Mouth: Mucous membranes are dry.     Pharynx: Oropharynx is clear. No oropharyngeal exudate.     Comments:  Postnasal drip Eyes:     Pupils: Pupils are equal, round, and reactive to light.  Cardiovascular:     Rate and Rhythm: Normal rate and regular rhythm.     Pulses: Normal pulses.     Heart sounds: Normal heart sounds. No murmur heard.   Pulmonary:     Effort: Pulmonary effort is normal.     Breath sounds: No decreased breath sounds, wheezing or rales.  Abdominal:     General: Bowel sounds are normal. There is no distension.     Palpations: Abdomen is soft.     Tenderness: There is no abdominal tenderness.  Musculoskeletal:     Cervical back: Normal range of motion.  Lymphadenopathy:     Cervical: No cervical adenopathy.  Skin:    General: Skin is warm and dry.     Capillary Refill: Capillary refill takes less than 2 seconds.     Findings: No erythema or rash.  Neurological:     General: No focal deficit present.     Mental Status: He is alert and oriented to person, place, and time.     Motor: No weakness.     Coordination: Coordination normal.     Gait: Gait is intact. Gait normal.  Psychiatric:        Mood and Affect: Mood normal.        Behavior: Behavior normal. Behavior is cooperative.        Thought Content: Thought content normal.        Judgment: Judgment normal.       Assessment & Plan:   COPD GOLD III Plan: Chest x-ray today Continue Dulera 200 Follow-up in 6 weeks to establish care with Dr. Elsworth Soho in a 30-minute time slot  Human immunodeficiency virus (HIV) disease ([place]) Plan: Continue follow-up with infectious disease Continue medications as outlined by infectious disease  Healthcare maintenance Plan: Recommend seasonal flu vaccine, patient declined Patient up-to-date with first 2 injections of COVID-19 vaccination  Cough Patient currently on Tussionex for management of cough.  He received a 5-day prescription refill earlier this month as well as a 7-day prescription refill from urgent care.  He has a total of 12 days.  Requesting refill today on day  8.  I discussed with patient as well as sister is here with him that we will not be refilling this.  He does appear that infectious disease has prescribed this in the past occasionally as patient reports allergies to over-the-counter cough medications.  Plan: Chest x-ray today to further evaluate current symptoms We will not refill Tussionex We will send in Tessalon cough Perles Sputum culture today to further investigate productive cough with discolored sputum as patient is already status post a Z-Pak and doxycycline within the last 30 days   Addendum: 05/11/2020 Chest  x-ray shows opacity.  We will treat with Levaquin.  Patient will need to have follow-up chest x-ray in 6 to 8 weeks to ensure clearing.  Patient to keep upcoming appointment with Dr. Elsworth Soho to establish care.     Return in about 6 weeks (around 06/22/2020), or if symptoms worsen or fail to improve, for Follow up with Dr. Elsworth Soho, [place].   Lauraine Rinne, NP 05/11/2020   This appointment required 34 minutes of patient care (this includes precharting, chart review, review of results, face-to-face care, etc.).

## 2020-05-11 NOTE — Telephone Encounter (Signed)
Spoke with sister (dpr on file), aware of results/recs.  abx sent to preferred pharmacy.  Pt has appt with RA on 12/14, will obtain follow up cxr at that time.  Sister also asked for refill of Qnasl.  This is not on current med list.  Spoke with Aaron Edelman who approved X1 refill of this med.  This has also been sent to pharmacy.  Nothing further needed at this time- will close encounter.

## 2020-05-11 NOTE — Telephone Encounter (Signed)
Casey Brennan with [facility] with a call report on 05/11/20 cxr.  Impression copied below, full report available in Epic:   IMPRESSION: Airspace opacity consistent with pneumonia right middle lobe. Lungs elsewhere clear. Heart size normal.  Aaron Edelman please advise on recs.  Thanks!

## 2020-05-11 NOTE — Patient Instructions (Addendum)
You were seen today by Lauraine Rinne, NP  for:   1. COPD mixed type ([place])  - DG Chest 2 View; Future  Dulera 200 >>> 2 puffs in the morning right when you wake up, rinse out your mouth after use, 12 hours later 2 puffs, rinse after use >>> Take this daily, no matter what >>> This is not a rescue inhaler   Only use your albuterol as a rescue medication to be used if you can't catch your breath by resting or doing a relaxed purse lip breathing pattern.  - The less you use it, the better it will work when you need it. - Ok to use up to 2 puffs  every 4 hours if you must but call for immediate appointment if use goes up over your usual need - Don't leave home without it !!  (think of it like the spare tire for your car)   Note your daily symptoms > remember "red flags" for COPD:   >>>Increase in cough >>>increase in sputum production >>>increase in shortness of breath or activity  intolerance.   If you notice these symptoms, please call the office to be seen.    2. Cough  - DG Chest 2 View; Future - Respiratory or Resp and Sputum Culture; Future - benzonatate (TESSALON) 200 MG capsule; Take 1 capsule (200 mg total) by mouth 3 (three) times daily as needed for cough.  Dispense: 30 capsule; Refill: 1  You can continue to use the Tussionex that you already have prescribed  We will hold off on additional narcotic cough medicine refills today  Would recommend that you continue to use over-the-counter measures for cold/congestion  Recommendations here would be Delsym over-the-counter cough syrup  I have sent in a course of Tessalon perles to help with cough suppression as well  Continue take Mucinex, with full glass of water  We will test her sputum today  We will obtain a chest x-ray today  3. Human immunodeficiency virus (HIV) disease ([place])  Keep follow-up with infectious disease  Continue medications as managed by infectious disease    We recommend today:  Orders Placed  This Encounter  Procedures  . Respiratory or Resp and Sputum Culture    Standing Status:   Future    Standing Expiration Date:   05/11/2021  . DG Chest 2 View    Standing Status:   Future    Number of Occurrences:   1    Standing Expiration Date:   09/08/2020    Order Specific Question:   Reason for Exam (SYMPTOM  OR DIAGNOSIS REQUIRED)    Answer:   cough    Order Specific Question:   Preferred imaging location?    Answer:   Internal    Order Specific Question:   Radiology Contrast Protocol - do NOT remove file path    Answer:   [email or web address]   Orders Placed This Encounter  Procedures  . Respiratory or Resp and Sputum Culture  . DG Chest 2 View   Meds ordered this encounter  Medications  . benzonatate (TESSALON) 200 MG capsule    Sig: Take 1 capsule (200 mg total) by mouth 3 (three) times daily as needed for cough.    Dispense:  30 capsule    Refill:  1    Follow Up:    Return in about 6 weeks (around 06/22/2020), or if symptoms worsen or fail to improve, for Follow up with Dr. Elsworth Soho, [place]. Must be in  30-minute time slot as new patient to Dr. Elsworth Soho, former patient of Dr. Melvyn Novas  Notification of test results are managed in the following manner: If there are  any recommendations or changes to the  plan of care discussed in office today,  we will contact you and let you know what they are. If you do not hear from [place], then your results are normal and you can view them through your  MyChart account , or a letter will be sent to you. Thank you again for trusting [place] with your care  - Thank you, [facility]    It is flu season:   >>> Best ways to protect herself from the flu: Receive the yearly flu vaccine, practice good hand hygiene washing with soap and also using hand sanitizer when available, eat a nutritious meals, get adequate rest, hydrate appropriately       Please contact the office if your symptoms  worsen or you have concerns that you are not improving.   Thank you for choosing  [facility] for your healthcare, and for allowing [place] to partner with you on your healthcare journey. I am thankful to be able to provide care to you today.   Wyn Quaker FNP-C

## 2020-05-11 NOTE — Assessment & Plan Note (Signed)
Plan: Continue follow-up with infectious disease Continue medications as outlined by infectious disease

## 2020-05-11 NOTE — Assessment & Plan Note (Signed)
Plan: Chest x-ray today Continue Dulera 200 Follow-up in 6 weeks to establish care with Dr. Elsworth Soho in a 30-minute time slot

## 2020-05-11 NOTE — Telephone Encounter (Signed)
Patient sister April Bland Span called stating that after the last visit, the Metoprolol was increased to 50mg . After, increase he started having throbbing headaches from front to back. Patient stated that his pulse is really high and feels like it is beating out of his chest.   I advised her(April) to have patient go back to Metoprolol 25mg  until next office visit and that you will discuss further with them any issues, per your request.

## 2020-05-12 ENCOUNTER — Ambulatory Visit: Payer: Medicare Other | Admitting: Cardiology

## 2020-05-12 ENCOUNTER — Encounter: Payer: Self-pay | Admitting: Cardiology

## 2020-05-12 VITALS — BP 137/89 | HR 90 | Resp 16 | Ht 69.0 in | Wt 230.0 lb

## 2020-05-12 DIAGNOSIS — I10 Essential (primary) hypertension: Secondary | ICD-10-CM

## 2020-05-12 DIAGNOSIS — E782 Mixed hyperlipidemia: Secondary | ICD-10-CM | POA: Insufficient documentation

## 2020-05-12 NOTE — Progress Notes (Signed)
Patient referred by Campbell Riches, MD for hypertension  Subjective:   Casey Brennan, male    DOB: 1960/12/31, 60 y.o.   MRN: [id number]   Chief Complaint  Patient presents with  . Hypertension  . Follow-up  . Results    HPI  59 year old African-American male with hypertension, hyperlipidemia, HIV.  He did not tolerate metoprolol succinate 50 mg due to throbbing headache. I personally reviewed his Echocardiogram. I think EF is at least 40%. He denies any exertional dyspnea, leg edema, orthopnea, PND. Blood pressure better controlled today.   Initial consultation HPI 05/2020: Patient was recently seen by [facility] Dr. Eleonore Chiquito.  Prior to that, he was also seen by Drs. Harwani and [place].  His prior cardiac work-up showed coronary CTA with normal coronary arteries and calcium score 0.  He has not tolerated several antihypertensive medications in the past, including amlodipine, nifedipine, HCTZ, losartan, carvedilol. He is tolerating metoprolol succinate.  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 He uses Aspirin 325 mg on M,W,F during his trips.     Current Outpatient Medications on File Prior to Visit  Medication Sig Dispense Refill  . albuterol (VENTOLIN HFA) 108 (90 Base) MCG/ACT inhaler INHALE 2 PUFFS INTO THE LUNGS EVERY 6 HOURS AS NEEDED FOR WHEEZING OR SHORTNESS OF BREATH 36 g 3  . ALPRAZolam (XANAX) 0.5 MG tablet Take 1 tablet (0.5 mg total) by mouth 2 (two) times daily as needed for anxiety. 90 tablet 1  . aspirin EC 81 MG tablet Take 81 mg by mouth every Monday, Wednesday, and Friday.     [name] azithromycin (ZITHROMAX) 250 MG tablet Take 1 tablet (250 mg total) by mouth as directed. 6 tablet 0  . Beclomethasone Dipropionate (QNASL) 80 MCG/ACT AERS Place 2 sprays into the nose daily. 10.6 g 0  . benzonatate (TESSALON) 200 MG capsule Take 1 capsule (200 mg total) by mouth 3 (three) times daily as needed for cough. 30 capsule 1   . chlorpheniramine-HYDROcodone (TUSSIONEX PENNKINETIC ER) 10-8 MG/5ML SUER Take 5 mLs by mouth every 12 (twelve) hours as needed for cough. 50 mL 0  . diclofenac Sodium (VOLTAREN) 1 % GEL APPLY 2 GRAMS EXTERNALLY TO THE AFFECTED AREA TWICE DAILY AS NEEDED FOR PAIN 100 g 0  . EPINEPHrine 0.3 mg/0.3 mL IJ SOAJ injection Inject 0.3 mLs (0.3 mg total) into the muscle as needed for anaphylaxis. 1 each 3  . esomeprazole (NEXIUM) 40 MG capsule Take 1 capsule (40 mg total) by mouth daily at 12 noon. 90 capsule 1  . gabapentin (NEURONTIN) 300 MG capsule Take 1 capsule (300 mg total) by mouth 4 (four) times daily as needed (pain). 120 capsule 11  . Glucos-Chond-Hyal Ac-Ca Fructo (MOVE FREE JOINT HEALTH ADVANCE PO) Take by mouth daily.    . hydrOXYzine (ATARAX/VISTARIL) 25 MG tablet TAKE 1 TABLET(25 MG) BY MOUTH EVERY NIGHT (Patient taking differently: Take 25 mg by mouth at bedtime. ) 90 tablet 3  . ipratropium (ATROVENT) 0.02 % nebulizer solution USE 2.5 ML(0.5 MG) VIA NEBULIZER THREE TIMES DAILY AS NEEDED FOR WHEEZING OR SHORTNESS OF BREATH (Patient taking differently: Take 0.5 mg by nebulization 3 (three) times daily as needed for wheezing or shortness of breath. ) 1650 mL 1  . ISENTRESS 400 MG tablet TAKE 2 TABLETS(800 MG) BY MOUTH AT BEDTIME 60 tablet 5  . levofloxacin (LEVAQUIN) 500 MG tablet Take 1 tablet (500 mg total) by mouth daily. 7  tablet 0  . metoprolol succinate (TOPROL-XL) 50 MG 24 hr tablet Take 1 tablet (50 mg total) by mouth daily. (Patient taking differently: Take 25 mg by mouth daily. ) 30 tablet 2  . mometasone-formoterol (DULERA) 200-5 MCG/ACT AERO INHALE 2 PUFFS BY MOUTH EVERY MORNING AND 2 PUFFS 12 HOURS LATER 13 g 0  . Multiple Vitamin (MULTIVITAMIN WITH MINERALS) TABS tablet Take 1 tablet by mouth daily after breakfast.     . mupirocin ointment (BACTROBAN) 2 % APPLY EXTERNALLY TO THE AFFECTED AREA TWICE DAILY FOR 3 WEEKS, PLACE COTTON BETWEEN TOES TO ALLOW BETTER AERATION AS NEEDED 66  g 1  . naproxen (NAPROSYN) 500 MG tablet Take 1 tablet by mouth 2 (two) times daily.    . ondansetron (ZOFRAN ODT) 8 MG disintegrating tablet Take 1 tablet (8 mg total) by mouth every 8 (eight) hours as needed for nausea or vomiting. 60 tablet 3  . Polyethyl Glycol-Propyl Glycol (SYSTANE) 0.4-0.3 % GEL ophthalmic gel Place 1 application into both eyes 2 (two) times daily.     . SUMAtriptan (IMITREX) 100 MG tablet TAKE 1 TABLET BY MOUTH EVERY 2 HOURS AS NEEDED FOR MIGRAINE, MAY REPEAT IN 2 HOURS IF HEADACHE PERSISTS OR RECURS 10 tablet 2  . tizanidine (ZANAFLEX) 6 MG capsule Take 1 capsule (6 mg total) by mouth 3 (three) times daily. 90 capsule 11  . torsemide (DEMADEX) 20 MG tablet Take 20 mg by mouth daily as needed.    . TRUVADA 200-300 MG tablet Take 1 tablet by mouth daily. 30 tablet 5  . [DISCONTINUED] zolpidem (AMBIEN) 5 MG tablet Take 1-2 tablets (5-10 mg total) by mouth at bedtime as needed for sleep. (Patient not taking: Reported on 04/23/2019) 30 tablet 2   No current facility-administered medications on file prior to visit.    Cardiovascular and other pertinent studies:  Echocardiogram 05/06/2020:  Moderately depressed LV systolic function with EF 37%. Left ventricle  cavity is normal in size. Moderate concentric hypertrophy of the left  ventricle. Hypokinetic global wall motion. Doppler evidence of grade I  (impaired) diastolic dysfunction, normal LAP. Calculated EF 37%.  Structurally normal mitral valve. Mild (Grade I) mitral regurgitation.  Mild pulmonic regurgitation.  EKG 05/03/2020: Sinus tachycardia 100 bpm Left atrial enlargement   CCTA 04/07/2020: 1. Coronary calcium score of 0. 2. Normal coronary origin with right dominance. 3. Normal coronary arteries. RECOMMENDATIONS: 1. No evidence of CAD (0%). Consider non-atherosclerotic causes of chest pain.   Recent labs: 03/11/2020: Glucose 89.  BUN/creatinine 10/1.19.  GFR N/A. Na/K 139/3.9.  Rest of the CMP  normal. H/H 14/43. MCV 96.  Platelets 169. Cholesterol 153, triglycerides 92, HDL 52, LDL 82.    Review of Systems  Cardiovascular: Positive for dyspnea on exertion (Mild). Negative for chest pain, leg swelling, palpitations and syncope.         Vitals:   05/12/20 1106  BP: 137/89  Pulse: 90  Resp: 16  SpO2: 93%     Body mass index is 33.97 kg/m. Filed Weights   05/12/20 1106  Weight: 230 lb (104.3 kg)     Objective:   Physical Exam Vitals and nursing note reviewed.  Constitutional:      General: He is not in acute distress. Neck:     Vascular: No JVD.  Cardiovascular:     Rate and Rhythm: Normal rate and regular rhythm.     Heart sounds: Normal heart sounds. No murmur heard.   Pulmonary:     Effort: Pulmonary effort  is normal.     Breath sounds: Normal breath sounds. No wheezing or rales.          Assessment & Recommendations:    59 year old African-American male with hypertension, hyperlipidemia, COPD, HIV.  Hypertension: Unable to tolerate metoprolol succinate at 50 mg due to headaches. Okay to use 25 mg daily. If BP >140/80 mmHg consistently, could use 25 mg bid. Next step would be use hydralazine. He would like to avoid Bidil due to headache.  He is intolerant to multiple medications, including amlodipine, nifedipine, HCTZ, losartan, carvedilol  No primary indication for Aspirin for CAD. 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  Hypertensive cardiomyopathy: EF around 45%. Normal coronaries. 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 No contraindication to driving.   F/u in 3 months   Nigel Mormon, MD Pager: [phone] Office: [phone]

## 2020-05-12 NOTE — Telephone Encounter (Signed)
Aaron Edelman please advise on the following MyChart message:  Thressa Sheller,       I hope all is well.  This is Casey Brennan Tony's Lil Sister that met with you yesterday. I  am inquiring about his chest xray results.  It mentioned that the Radiologist stated he saw dextroscoliosis. This has never been seen at birth nor till now. What is your take on this? I believe this may be an error.  Thanks in advance.  Sincerely Yours,  Casey Brennan   Thank you

## 2020-05-12 NOTE — Telephone Encounter (Signed)
From pt

## 2020-05-12 NOTE — Telephone Encounter (Signed)
See if he can see me today.

## 2020-05-13 ENCOUNTER — Ambulatory Visit: Payer: Medicare Other | Admitting: Internal Medicine

## 2020-05-13 NOTE — Telephone Encounter (Signed)
05/13/2020  This is based off the radiologist read.  I am not a radiologist nor my a spine MD.  Casey Brennan can discuss this in further detail with either primary care or patient can be provided contact information for [facility] radiology and you can discuss with them that you feel that this may be an error.  Wyn Quaker, FNP

## 2020-05-14 ENCOUNTER — Other Ambulatory Visit: Payer: Self-pay | Admitting: Cardiology

## 2020-05-14 DIAGNOSIS — E782 Mixed hyperlipidemia: Secondary | ICD-10-CM

## 2020-05-14 LAB — RESPIRATORY CULTURE OR RESPIRATORY AND SPUTUM CULTURE
MICRO NUMBER:: 11179875
SPECIMEN QUALITY:: ADEQUATE

## 2020-05-14 NOTE — Addendum Note (Signed)
Addended by: Nigel Mormon on: 05/14/2020 02:30 PM   Modules accepted: Orders

## 2020-05-17 NOTE — Telephone Encounter (Signed)
Casey Brennan pts sister is asking about the sputum culture results.  Please advise. Thanks

## 2020-05-17 NOTE — Telephone Encounter (Signed)
05/17/2020  Patient does not need additional antibiotics.  On 05/11/2020 he was treated with Levaquin.  Did he start taking this?  This should be sufficient.  He needs to complete this course as outlined.  Keep upcoming follow-up with our office to establish care with Dr. Elsworth Soho in December/2021.  Wyn Quaker, FNP

## 2020-05-20 ENCOUNTER — Ambulatory Visit: Payer: Medicare Other | Admitting: Internal Medicine

## 2020-05-20 ENCOUNTER — Other Ambulatory Visit: Payer: Self-pay

## 2020-05-20 ENCOUNTER — Ambulatory Visit: Payer: Medicare Other | Admitting: Cardiology

## 2020-05-20 ENCOUNTER — Ambulatory Visit (INDEPENDENT_AMBULATORY_CARE_PROVIDER_SITE_OTHER): Payer: Medicare Other | Admitting: Internal Medicine

## 2020-05-20 ENCOUNTER — Encounter: Payer: Self-pay | Admitting: Internal Medicine

## 2020-05-20 ENCOUNTER — Ambulatory Visit (INDEPENDENT_AMBULATORY_CARE_PROVIDER_SITE_OTHER): Payer: Medicare Other

## 2020-05-20 VITALS — BP 122/80 | HR 68 | Temp 98.0°F | Resp 16 | Ht 69.0 in | Wt 225.0 lb

## 2020-05-20 DIAGNOSIS — R059 Cough, unspecified: Secondary | ICD-10-CM

## 2020-05-20 DIAGNOSIS — R7989 Other specified abnormal findings of blood chemistry: Secondary | ICD-10-CM | POA: Diagnosis not present

## 2020-05-20 DIAGNOSIS — J13 Pneumonia due to Streptococcus pneumoniae: Secondary | ICD-10-CM | POA: Diagnosis not present

## 2020-05-20 DIAGNOSIS — J328 Other chronic sinusitis: Secondary | ICD-10-CM

## 2020-05-20 DIAGNOSIS — Z23 Encounter for immunization: Secondary | ICD-10-CM | POA: Diagnosis not present

## 2020-05-20 MED ORDER — HYDROCOD POLST-CPM POLST ER 10-8 MG/5ML PO SUER: 5.0000 mL | Freq: Two times a day (BID) | ORAL | 0 refills | Status: DC | PRN

## 2020-05-20 NOTE — Progress Notes (Signed)
Subjective:  Patient ID: Casey Brennan, male    DOB: 03-22-1961  Age: 59 y.o. MRN: [id number]  CC: Cough  This visit occurred during the SARS-CoV-2 public health emergency.  Safety protocols were in place, including screening questions prior to the visit, additional usage of staff PPE, and extensive cleaning of exam room while observing appropriate contact time as indicated for disinfecting solutions.    HPI Casey Brennan presents for f/up - He was recently diagnosed with right middle lobe pneumonia.  He has completed a course of levofloxacin.  He continues to cough.  The cough is productive of thin white phlegm.  He requests a refill on the cough suppressant and a note for work.  He denies chest pain, hemoptysis, fever, chills, or night sweats.  About 9 years ago he had a mildly elevated TSH.  Outpatient Medications Prior to Visit  Medication Sig Dispense Refill  . albuterol (VENTOLIN HFA) 108 (90 Base) MCG/ACT inhaler INHALE 2 PUFFS INTO THE LUNGS EVERY 6 HOURS AS NEEDED FOR WHEEZING OR SHORTNESS OF BREATH 36 g 3  . ALPRAZolam (XANAX) 0.5 MG tablet Take 1 tablet (0.5 mg total) by mouth 2 (two) times daily as needed for anxiety. 90 tablet 1  . aspirin EC 81 MG tablet Take 81 mg by mouth every Monday, Wednesday, and Friday.     . Beclomethasone Dipropionate (QNASL) 80 MCG/ACT AERS Place 2 sprays into the nose daily. 10.6 g 0  . diclofenac Sodium (VOLTAREN) 1 % GEL APPLY 2 GRAMS EXTERNALLY TO THE AFFECTED AREA TWICE DAILY AS NEEDED FOR PAIN 100 g 0  . EPINEPHrine 0.3 mg/0.3 mL IJ SOAJ injection Inject 0.3 mLs (0.3 mg total) into the muscle as needed for anaphylaxis. 1 each 3  . esomeprazole (NEXIUM) 40 MG capsule Take 1 capsule (40 mg total) by mouth daily at 12 noon. 90 capsule 1  . gabapentin (NEURONTIN) 300 MG capsule Take 1 capsule (300 mg total) by mouth 4 (four) times daily as needed (pain). 120 capsule 11  . Glucos-Chond-Hyal Ac-Ca Fructo (MOVE FREE JOINT HEALTH ADVANCE PO) Take by  mouth daily.    . hydrOXYzine (ATARAX/VISTARIL) 25 MG tablet TAKE 1 TABLET(25 MG) BY MOUTH EVERY NIGHT (Patient taking differently: Take 25 mg by mouth at bedtime. ) 90 tablet 3  . ipratropium (ATROVENT) 0.02 % nebulizer solution USE 2.5 ML(0.5 MG) VIA NEBULIZER THREE TIMES DAILY AS NEEDED FOR WHEEZING OR SHORTNESS OF BREATH (Patient taking differently: Take 0.5 mg by nebulization 3 (three) times daily as needed for wheezing or shortness of breath. ) 1650 mL 1  . ISENTRESS 400 MG tablet TAKE 2 TABLETS(800 MG) BY MOUTH AT BEDTIME 60 tablet 5  . levofloxacin (LEVAQUIN) 500 MG tablet Take 1 tablet (500 mg total) by mouth daily. 7 tablet 0  . metoprolol succinate (TOPROL-XL) 50 MG 24 hr tablet Take 1 tablet (50 mg total) by mouth daily. (Patient taking differently: Take 25 mg by mouth daily. ) 30 tablet 2  . mometasone-formoterol (DULERA) 200-5 MCG/ACT AERO INHALE 2 PUFFS BY MOUTH EVERY MORNING AND 2 PUFFS 12 HOURS LATER 13 g 0  . Multiple Vitamin (MULTIVITAMIN WITH MINERALS) TABS tablet Take 1 tablet by mouth daily after breakfast.     . mupirocin ointment (BACTROBAN) 2 % APPLY EXTERNALLY TO THE AFFECTED AREA TWICE DAILY FOR 3 WEEKS, PLACE COTTON BETWEEN TOES TO ALLOW BETTER AERATION AS NEEDED 66 g 1  . naproxen (NAPROSYN) 500 MG tablet Take 1 tablet by mouth 2 (two) times  daily.    . Polyethyl Glycol-Propyl Glycol (SYSTANE) 0.4-0.3 % GEL ophthalmic gel Place 1 application into both eyes 2 (two) times daily.     . SUMAtriptan (IMITREX) 100 MG tablet TAKE 1 TABLET BY MOUTH EVERY 2 HOURS AS NEEDED FOR MIGRAINE, MAY REPEAT IN 2 HOURS IF HEADACHE PERSISTS OR RECURS 10 tablet 2  . tizanidine (ZANAFLEX) 6 MG capsule Take 1 capsule (6 mg total) by mouth 3 (three) times daily. 90 capsule 11  . torsemide (DEMADEX) 20 MG tablet Take 20 mg by mouth daily as needed.    . TRUVADA 200-300 MG tablet Take 1 tablet by mouth daily. 30 tablet 5  . benzonatate (TESSALON) 200 MG capsule Take 1 capsule (200 mg total) by mouth  3 (three) times daily as needed for cough. 30 capsule 1  . chlorpheniramine-HYDROcodone (TUSSIONEX PENNKINETIC ER) 10-8 MG/5ML SUER Take 5 mLs by mouth every 12 (twelve) hours as needed for cough. 50 mL 0  . ondansetron (ZOFRAN ODT) 8 MG disintegrating tablet Take 1 tablet (8 mg total) by mouth every 8 (eight) hours as needed for nausea or vomiting. 60 tablet 3   No facility-administered medications prior to visit.    ROS Review of Systems  Constitutional: Negative for appetite change, chills, diaphoresis, fatigue and fever.  HENT: Negative.  Negative for sore throat.   Eyes: Negative.   Respiratory: Positive for cough. Negative for chest tightness, shortness of breath and wheezing.   Cardiovascular: Negative for chest pain, palpitations and leg swelling.  Gastrointestinal: Negative for abdominal pain, constipation, diarrhea, nausea and vomiting.  Endocrine: Negative.  Negative for cold intolerance and heat intolerance.  Genitourinary: Negative.  Negative for difficulty urinating.  Musculoskeletal: Negative for arthralgias, joint swelling and myalgias.  Skin: Negative for color change and pallor.  Neurological: Negative.  Negative for dizziness and weakness.  Hematological: Negative for adenopathy. Does not bruise/bleed easily.  Psychiatric/Behavioral: Negative.     Objective:  BP 122/80   Pulse 68   Temp 98 F (36.7 C) (Oral)   Resp 16   Ht 5\' 9"  (1.753 m)   Wt 225 lb (102.1 kg)   SpO2 98%   BMI 33.23 kg/m   BP Readings from Last 3 Encounters:  05/20/20 122/80  05/12/20 137/89  05/11/20 130/80    Wt Readings from Last 3 Encounters:  05/20/20 225 lb (102.1 kg)  05/12/20 230 lb (104.3 kg)  05/11/20 231 lb 6.4 oz (105 kg)    Physical Exam Vitals reviewed.  Constitutional:      Appearance: He is not ill-appearing.  HENT:     Nose: Nose normal.     Mouth/Throat:     Mouth: Mucous membranes are moist.  Eyes:     General: No scleral icterus.    Conjunctiva/sclera:  Conjunctivae normal.  Neck:     Thyroid: No thyroid mass, thyromegaly or thyroid tenderness.  Cardiovascular:     Rate and Rhythm: Normal rate and regular rhythm.     Heart sounds: No murmur heard.   Pulmonary:     Effort: Pulmonary effort is normal.     Breath sounds: No stridor. No wheezing, rhonchi or rales.  Abdominal:     General: Abdomen is flat.     Palpations: There is no mass.     Tenderness: There is no abdominal tenderness. There is no guarding.  Musculoskeletal:        General: Normal range of motion.     Cervical back: Neck supple.  Right lower leg: No edema.     Left lower leg: No edema.  Lymphadenopathy:     Cervical: No cervical adenopathy.  Skin:    General: Skin is warm and dry.     Coloration: Skin is not pale.  Neurological:     General: No focal deficit present.     Mental Status: He is alert.  Psychiatric:        Mood and Affect: Mood normal.        Behavior: Behavior normal.     Lab Results  Component Value Date   WBC 4.3 03/11/2020   HGB 14.5 03/11/2020   HCT 43.2 03/11/2020   PLT 169 03/11/2020   GLUCOSE 89 03/11/2020   CHOL 153 03/11/2020   TRIG 92 03/11/2020   HDL 52 03/11/2020   LDLCALC 82 03/11/2020   ALT 21 03/11/2020   AST 21 03/11/2020   NA 141 03/11/2020   K 3.9 03/11/2020   CL 107 03/11/2020   CREATININE 1.19 03/11/2020   BUN 10 03/11/2020   CO2 26 03/11/2020   TSH 3.29 05/20/2020   PSA <0.1 03/11/2020   INR 0.95 01/16/2017   HGBA1C (L) 10/10/2010    4.3 (NOTE)                                                                       According to the ADA Clinical Practice Recommendations for 2011, when HbA1c is used as a screening test:   >=6.5%   Diagnostic of Diabetes Mellitus           (if abnormal result  is confirmed)  5.7-6.4%   Increased risk of developing Diabetes Mellitus  References:Diagnosis and Classification of Diabetes Mellitus,Diabetes YWVP,7106,26(RSWNI 1):S62-S69 and Standards of Medical Care in          Diabetes - 2011,Diabetes Care,2011,34  (Suppl 1):S11-S61.   MICROALBUR 16.88 (H) 10/05/2011    CT CORONARY MORPH W/CTA COR W/SCORE W/CA W/CM &/OR WO/CM  Addendum Date: 04/07/2020   ADDENDUM REPORT: 04/07/2020 13:06 CLINICAL DATA:  Chest pain EXAM: Cardiac/Coronary CTA TECHNIQUE: The patient was scanned on a [employer]. A 100 kV prospective scan was triggered in the descending thoracic aorta at 111 HU's. Axial non-contrast 3 mm slices were carried out through the heart. The data set was analyzed on a dedicated work station and scored using the [place]. Gantry rotation speed was 250 msecs and collimation was .6 mm. 5 mg IV metoprolol and 0.8 mg of sl NTG was given. The 3D data set was reconstructed in 5% intervals of the 35-75 % of the R-R cycle. Diastolic phases were analyzed on a dedicated work station using MPR, MIP and VRT modes. The patient received 80 cc of contrast. FINDINGS: Image quality: excellent. Noise artifact is: Limited. Coronary Arteries:  Normal coronary origin.  Right dominance. Left main: The left main is a large caliber vessel with a normal take off from the left coronary cusp that trifurcates into a LAD, LCX, and ramus intermedius. There is no plaque or stenosis. Left anterior descending artery: The LAD is patent without evidence of plaque or stenosis. The LAD gives off 2 patent diagonal branches. Ramus intermedius: Patent with no evidence of plaque or stenosis. Left circumflex artery: The  LCX is non-dominant and patent with no evidence of plaque or stenosis. The LCX gives off 1 patent obtuse marginal branch. Right coronary artery: The RCA is dominant with normal take off from the right coronary cusp. There is no evidence of plaque or stenosis. The RCA terminates as a PDA and right posterolateral branch without evidence of plaque or stenosis. Right Atrium: Right atrial size is within normal limits. Right Ventricle: The right ventricular cavity is within normal limits.  Left Atrium: Left atrial size is normal in size with no left atrial appendage filling defect. Left Ventricle: The ventricular cavity size is within normal limits. There are no stigmata of prior infarction. There is no abnormal filling defect. Pulmonary arteries: Normal in size without proximal filling defect. Pulmonary veins: Normal pulmonary venous drainage. Pericardium: Normal thickness with no significant effusion or calcium present. Cardiac valves: The aortic valve is trileaflet without significant calcification. The mitral valve is normal structure without significant calcification. Aorta: Normal caliber with no significant disease. Extra-cardiac findings: See attached radiology report for non-cardiac structures. IMPRESSION: 1. Coronary calcium score of 0. 2. Normal coronary origin with right dominance. 3. Normal coronary arteries. RECOMMENDATIONS: 1. No evidence of CAD (0%). Consider non-atherosclerotic causes of chest pain. Eleonore Chiquito, MD Electronically Signed   By: Eleonore Chiquito   On: 04/07/2020 13:06   Result Date: 04/07/2020 EXAM: OVER-READ INTERPRETATION  CT CHEST The following report is an over-read performed by radiologist Dr. Vinnie Langton of [facility], [place] on 04/07/2020. This over-read does not include interpretation of cardiac or coronary anatomy or pathology. The coronary calcium score/coronary CTA interpretation by the cardiologist is attached. COMPARISON:  None. FINDINGS: Within the visualized portions of the thorax there are no suspicious appearing pulmonary nodules or masses, there is no acute consolidative airspace disease, no pleural effusions, no pneumothorax and no lymphadenopathy. Visualized portions of the upper abdomen demonstrates an incompletely imaged low-attenuation lesion in the upper pole of the right kidney measuring at least 3 cm, compatible with a simple cyst (better demonstrated on prior CT the abdomen and pelvis 09/25/2016). A right adrenal nodule is also  incompletely imaged, but measures at least 1.2 cm in diameter, also similar to the prior study, presumably a benign lesions such as an adenoma. There are no aggressive appearing lytic or blastic lesions noted in the visualized portions of the skeleton. IMPRESSION: No significant incidental noncardiac findings are noted. Electronically Signed: By: Vinnie Langton M.D. On: 04/07/2020 08:24   DG Chest 2 View  Result Date: 05/20/2020 CLINICAL DATA:  Follow-up pneumonia. EXAM: CHEST - 2 VIEW COMPARISON:  05/11/2020. FINDINGS: Mediastinum hilar structures normal. Heart size normal. Interim resolution of right middle lobe infiltrate. Prominent bilateral nipple shadows again noted. No pleural effusion or pneumothorax. Thoracic spine scoliosis again noted. No acute bony abnormality. IMPRESSION: Interim resolution of right middle lobe infiltrate. Electronically Signed   By: Marcello Moores  Register   On: 05/20/2020 15:29    Assessment & Plan:   Ryun was seen today for cough.  Diagnoses and all orders for this visit:  Cough- His chest x-ray is negative for mass or infiltrate. -     DG Chest 2 View; Future -     chlorpheniramine-HYDROcodone (TUSSIONEX PENNKINETIC ER) 10-8 MG/5ML SUER; Take 5 mLs by mouth every 12 (twelve) hours as needed for cough.  Pneumonia of right middle lobe due to Streptococcus pneumoniae [facility])- Based on his symptoms, exam, and x-ray this has resolved.  Will continue to suppress the cough. -  DG Chest 2 View; Future -     chlorpheniramine-HYDROcodone (TUSSIONEX PENNKINETIC ER) 10-8 MG/5ML SUER; Take 5 mLs by mouth every 12 (twelve) hours as needed for cough.  Elevated TSH- His TSH is normal now and he appears euthyroid. -     TSH; Future -     TSH  Other chronic sinusitis  Other orders -     Flu Vaccine QUAD 6+ mos PF IM (Fluarix Quad PF)   I have discontinued Jeneen Rinks A. Rentz "Tony"'s ondansetron and benzonatate. I am also having him maintain his aspirin EC, multivitamin with  minerals, Polyethyl Glycol-Propyl Glycol, ipratropium, torsemide, naproxen, tizanidine, hydrOXYzine, EPINEPHrine, diclofenac Sodium, Truvada, Isentress, albuterol, mupirocin ointment, Dulera, esomeprazole, Glucos-Chond-Hyal Ac-Ca Fructo (MOVE FREE JOINT HEALTH ADVANCE PO), metoprolol succinate, gabapentin, ALPRAZolam, SUMAtriptan, Qnasl, levofloxacin, and chlorpheniramine-HYDROcodone.  Meds ordered this encounter  Medications  . chlorpheniramine-HYDROcodone (TUSSIONEX PENNKINETIC ER) 10-8 MG/5ML SUER    Sig: Take 5 mLs by mouth every 12 (twelve) hours as needed for cough.    Dispense:  115 mL    Refill:  0     Follow-up: Return in about 3 weeks (around 06/10/2020).  Scarlette Calico, MD

## 2020-05-20 NOTE — Patient Instructions (Signed)

## 2020-05-21 ENCOUNTER — Ambulatory Visit: Payer: Medicare Other | Admitting: Primary Care

## 2020-05-21 LAB — TSH: TSH: 3.29 u[IU]/mL (ref 0.35–4.50)

## 2020-05-23 ENCOUNTER — Other Ambulatory Visit: Payer: Self-pay | Admitting: Pulmonary Disease

## 2020-05-23 ENCOUNTER — Other Ambulatory Visit: Payer: Self-pay | Admitting: Neurology

## 2020-05-23 DIAGNOSIS — R252 Cramp and spasm: Secondary | ICD-10-CM

## 2020-05-23 DIAGNOSIS — G250 Essential tremor: Secondary | ICD-10-CM

## 2020-05-23 DIAGNOSIS — B20 Human immunodeficiency virus [HIV] disease: Secondary | ICD-10-CM

## 2020-05-23 DIAGNOSIS — G44229 Chronic tension-type headache, not intractable: Secondary | ICD-10-CM

## 2020-05-25 ENCOUNTER — Telehealth: Payer: Self-pay | Admitting: Internal Medicine

## 2020-05-25 NOTE — Telephone Encounter (Signed)
Sister calling wondering if he can get a letter stating he is cleared to come back to work, Dr. Jenny Reichmann had wrote the patient out of work until 11/27 and his employer would like a letter to say he is fine to return.  #[phone]

## 2020-05-26 LAB — LIPID PANEL
Chol/HDL Ratio: 3.6 ratio (ref 0.0–5.0)
Cholesterol, Total: 163 mg/dL (ref 100–199)
HDL: 45 mg/dL (ref >39)
LDL Chol Calc (NIH): 97 mg/dL (ref 0–99)
Triglycerides: 114 mg/dL (ref 0–149)
VLDL Cholesterol Cal: 21 mg/dL (ref 5–40)

## 2020-05-26 NOTE — Telephone Encounter (Signed)
Sent to Dr. [name]. 

## 2020-05-26 NOTE — Telephone Encounter (Signed)
Ok to forward to pcp 

## 2020-06-01 ENCOUNTER — Other Ambulatory Visit: Payer: Self-pay | Admitting: Infectious Diseases

## 2020-06-01 ENCOUNTER — Other Ambulatory Visit: Payer: Self-pay | Admitting: Neurology

## 2020-06-01 DIAGNOSIS — R252 Cramp and spasm: Secondary | ICD-10-CM

## 2020-06-01 DIAGNOSIS — G44209 Tension-type headache, unspecified, not intractable: Secondary | ICD-10-CM

## 2020-06-08 NOTE — Telephone Encounter (Signed)
Currently on metoprolol succinate 25 mg. Recommend increasing to 50 mg daily. If tolerates, please send 50 mg 90 pills X3 refills.  Thanks MJP

## 2020-06-15 ENCOUNTER — Ambulatory Visit: Payer: Medicare Other | Admitting: Pulmonary Disease

## 2020-06-15 ENCOUNTER — Encounter: Payer: Self-pay | Admitting: Neurology

## 2020-06-15 ENCOUNTER — Other Ambulatory Visit: Payer: Self-pay | Admitting: Neurology

## 2020-06-15 ENCOUNTER — Telehealth: Payer: Self-pay | Admitting: Neurology

## 2020-06-15 DIAGNOSIS — R252 Cramp and spasm: Secondary | ICD-10-CM

## 2020-06-15 DIAGNOSIS — G44209 Tension-type headache, unspecified, not intractable: Secondary | ICD-10-CM

## 2020-06-15 MED ORDER — TIZANIDINE HCL 6 MG PO CAPS: 6.0000 mg | ORAL_CAPSULE | Freq: Three times a day (TID) | ORAL | 11 refills | Status: DC

## 2020-06-15 NOTE — Telephone Encounter (Signed)
Pt's sister(Langley,April on DPR) is aware of the denial on the medication  tizanidine (ZANAFLEX) 6 MG capsule.  She states pt is out and would like to know what is going to be done.

## 2020-06-15 NOTE — Telephone Encounter (Signed)
Refill has been sent for the patient and I have replied to the patient's mychart message.

## 2020-06-17 ENCOUNTER — Ambulatory Visit: Payer: Medicare Other | Admitting: Internal Medicine

## 2020-06-21 ENCOUNTER — Ambulatory Visit: Payer: Medicare Other | Admitting: Cardiovascular Disease

## 2020-06-21 ENCOUNTER — Telehealth: Payer: Self-pay

## 2020-06-21 NOTE — Telephone Encounter (Signed)
Spoke with patient sister (April- POA), she is aware that patient has been added to the schedule for tomorrow @ 3:30pm as discussed. She will have him here.

## 2020-06-21 NOTE — Telephone Encounter (Signed)
Replied to a similar message earlier today. Recommend office f/u. Recommend to bring log of BO readings.  Thanks MJP

## 2020-06-21 NOTE — Telephone Encounter (Signed)
Cannot address all questions over message, although these symptoms appear non-cardiac. Recommend office visit, as previously mentioned.

## 2020-06-21 NOTE — Telephone Encounter (Signed)
Patient sister(April/POA) called stating that patient has increased the Metoprolol to 50 mg as directed for 2 weeks and BP is still elevated to 165/114 HR 64. Patient has been having left arm and hand pain and on the left side of his neck and has been consistent for almost a week now. She said you wanted her to call us back if there was any changes. Patient sees Percell Miller [facility] and has had 3 neck injections, and is still having the pain, and advised her(sister- April /POA) to have patient see his cardiologist.

## 2020-06-21 NOTE — Telephone Encounter (Signed)
Scheduled OV at some point.  Thanks MJP

## 2020-06-22 ENCOUNTER — Ambulatory Visit: Payer: Medicare Other | Admitting: Cardiology

## 2020-06-22 ENCOUNTER — Other Ambulatory Visit: Payer: Self-pay

## 2020-06-22 NOTE — Telephone Encounter (Signed)
Pt's appointment has been moved to 2 pm today

## 2020-06-23 ENCOUNTER — Ambulatory Visit (INDEPENDENT_AMBULATORY_CARE_PROVIDER_SITE_OTHER): Payer: Medicare Other | Admitting: Internal Medicine

## 2020-06-23 ENCOUNTER — Other Ambulatory Visit: Payer: Self-pay

## 2020-06-23 ENCOUNTER — Encounter: Payer: Self-pay | Admitting: Internal Medicine

## 2020-06-23 VITALS — BP 142/90 | HR 91 | Temp 98.1°F | Ht 69.0 in | Wt 233.0 lb

## 2020-06-23 DIAGNOSIS — J449 Chronic obstructive pulmonary disease, unspecified: Secondary | ICD-10-CM

## 2020-06-23 DIAGNOSIS — R7989 Other specified abnormal findings of blood chemistry: Secondary | ICD-10-CM | POA: Diagnosis not present

## 2020-06-23 DIAGNOSIS — E782 Mixed hyperlipidemia: Secondary | ICD-10-CM

## 2020-06-23 DIAGNOSIS — Z0000 Encounter for general adult medical examination without abnormal findings: Secondary | ICD-10-CM | POA: Diagnosis not present

## 2020-06-23 DIAGNOSIS — J13 Pneumonia due to Streptococcus pneumoniae: Secondary | ICD-10-CM | POA: Diagnosis not present

## 2020-06-23 DIAGNOSIS — Z0001 Encounter for general adult medical examination with abnormal findings: Secondary | ICD-10-CM

## 2020-06-23 DIAGNOSIS — J45909 Unspecified asthma, uncomplicated: Secondary | ICD-10-CM

## 2020-06-23 DIAGNOSIS — I10 Essential (primary) hypertension: Secondary | ICD-10-CM | POA: Diagnosis not present

## 2020-06-23 DIAGNOSIS — G4710 Hypersomnia, unspecified: Secondary | ICD-10-CM

## 2020-06-23 NOTE — Patient Instructions (Signed)
Please continue all other medications as before, and refills have been done if requested.  Please have the pharmacy call with any other refills you may need.  Please continue your efforts at being more active, low cholesterol diet, and weight control.  You are otherwise up to date with prevention measures today.  Please keep your appointments with your specialists as you may have planned - cardiology tomorrow  You will be contacted regarding the referral for: pulmonary - Dr Elsworth Soho  No further lab work needed today  Please make an Appointment to return in 6 months, or sooner if needed

## 2020-06-23 NOTE — Assessment & Plan Note (Signed)
?   Sleep apnea - ok for refer back to Dr Mia Creek

## 2020-06-23 NOTE — Assessment & Plan Note (Signed)
Most recent tsh normal, now further tx needed  Lab Results  Component Value Date   TSH 3.29 05/20/2020

## 2020-06-23 NOTE — Assessment & Plan Note (Signed)
Resolved, no need furhter tx at this time

## 2020-06-23 NOTE — Progress Notes (Deleted)
   Subjective:    Patient ID: Casey Brennan, male    DOB: 19-Nov-1960, 59 y.o.   MRN: [id number]  HPI      Has 2 wks onset left cervical radiculitis after fall followed by ortho at [facility].  Review of Systems     Objective:   Physical Exam        Assessment & Plan:

## 2020-06-24 ENCOUNTER — Encounter: Payer: Self-pay | Admitting: Cardiology

## 2020-06-24 ENCOUNTER — Ambulatory Visit: Payer: Medicare Other | Admitting: Cardiology

## 2020-06-24 ENCOUNTER — Other Ambulatory Visit: Payer: Self-pay | Admitting: Cardiology

## 2020-06-24 DIAGNOSIS — I10 Essential (primary) hypertension: Secondary | ICD-10-CM

## 2020-06-24 MED ORDER — METOPROLOL SUCCINATE ER 100 MG PO TB24: 100.0000 mg | ORAL_TABLET | Freq: Every day | ORAL | 1 refills | Status: DC

## 2020-06-24 NOTE — Progress Notes (Signed)
Patient referred by Biagio Borg, MD for hypertension  Subjective:   Casey Brennan, male    DOB: Jun 10, 1961, 59 y.o.   MRN: [id number]   Chief Complaint  Patient presents with  . Hypertension  . Follow-up    Hypertension Pertinent negatives include no chest pain or palpitations.    59 year old male with hypertension, hyperlipidemia, HIV.  His blood pressure has been elevated in 160s/110s while on metoprolol succinate 50 mg daily. He has recently had cervicalgia and radiation to left arm with mild weakness. He is on prednisone for possible bulging disc with edema,   Initial consultation HPI 05/2020: Patient was recently seen by [facility] Dr. Eleonore Chiquito.  Prior to that, he was also seen by Drs. Harwani and [place].  His prior cardiac work-up showed coronary CTA with normal coronary arteries and calcium score 0.  He has not tolerated several antihypertensive medications in the past, including amlodipine, nifedipine, HCTZ, losartan, carvedilol. He is tolerating metoprolol succinate.  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 He uses Aspirin 325 mg on M,W,F during his trips.     Current Outpatient Medications on File Prior to Visit  Medication Sig Dispense Refill  . albuterol (VENTOLIN HFA) 108 (90 Base) MCG/ACT inhaler INHALE 2 PUFFS INTO THE LUNGS EVERY 6 HOURS AS NEEDED FOR WHEEZING OR SHORTNESS OF BREATH 36 g 3  . ALPRAZolam (XANAX) 0.5 MG tablet Take 1 tablet (0.5 mg total) by mouth 2 (two) times daily as needed for anxiety. 90 tablet 1  . aspirin EC 81 MG tablet Take 81 mg by mouth every Monday, Wednesday, and Friday.     . Beclomethasone Dipropionate (QNASL) 80 MCG/ACT AERS Place 2 sprays into the nose daily. 10.6 g 0  . cetirizine (ZYRTEC) 10 MG tablet 1 tablet    . diclofenac Sodium (VOLTAREN) 1 % GEL APPLY 2 GRAMS EXTERNALLY TO THE AFFECTED AREA TWICE DAILY AS NEEDED FOR PAIN 100 g 0  . EPINEPHrine 0.3 mg/0.3 mL IJ SOAJ  injection Inject 0.3 mLs (0.3 mg total) into the muscle as needed for anaphylaxis. 1 each 3  . gabapentin (NEURONTIN) 300 MG capsule Take 1 capsule (300 mg total) by mouth 4 (four) times daily as needed (pain). 120 capsule 11  . Glucos-Chond-Hyal Ac-Ca Fructo (MOVE FREE JOINT HEALTH ADVANCE PO) Take by mouth daily.    [name] HYDROcodone-acetaminophen (NORCO/VICODIN) 5-325 MG tablet Take 1 tablet by mouth every 6 (six) hours as needed for moderate pain.    . hydrOXYzine (ATARAX/VISTARIL) 25 MG tablet Take 1 tablet (25 mg total) by mouth at bedtime. 90 tablet 3  . ipratropium (ATROVENT) 0.02 % nebulizer solution USE 2.5 ML(0.5 MG) VIA NEBULIZER THREE TIMES DAILY AS NEEDED FOR WHEEZING OR SHORTNESS OF BREATH (Patient taking differently: Take 0.5 mg by nebulization 3 (three) times daily as needed for wheezing or shortness of breath.) 1650 mL 1  . ISENTRESS 400 MG tablet TAKE 2 TABLETS(800 MG) BY MOUTH AT BEDTIME 60 tablet 5  . metoprolol succinate (TOPROL-XL) 50 MG 24 hr tablet Take 1 tablet (50 mg total) by mouth daily. (Patient taking differently: Take 25 mg by mouth daily.) 30 tablet 2  . mometasone-formoterol (DULERA) 200-5 MCG/ACT AERO INHALE 2 PUFFS BY MOUTH EVERY MORNING AND 2 PUFFS 12 HOURS LATER 13 g 0  . Multiple Vitamin (MULTIVITAMIN WITH MINERALS) TABS tablet Take 1 tablet by mouth daily after breakfast.     . mupirocin ointment (BACTROBAN) 2 %  APPLY EXTERNALLY TO THE AFFECTED AREA TWICE DAILY FOR 3 WEEKS, PLACE COTTON BETWEEN TOES TO ALLOW BETTER AERATION AS NEEDED 66 g 1  . omeprazole (PRILOSEC) 40 MG capsule 1 capsule 30 minutes before morning meal    . Polyethyl Glycol-Propyl Glycol (SYSTANE) 0.4-0.3 % GEL ophthalmic gel Place 1 application into both eyes 2 (two) times daily.     . SUMAtriptan (IMITREX) 100 MG tablet TAKE 1 TABLET BY MOUTH EVERY 2 HOURS AS NEEDED FOR MIGRAINE, MAY REPEAT IN 2 HOURS IF HEADACHE PERSISTS OR RECURS 10 tablet 2  . tizanidine (ZANAFLEX) 6 MG capsule Take 1 capsule (6  mg total) by mouth 3 (three) times daily. 90 capsule 11  . torsemide (DEMADEX) 20 MG tablet Take 20 mg by mouth daily as needed.    . TRUVADA 200-300 MG tablet Take 1 tablet by mouth daily. 30 tablet 5  . [DISCONTINUED] zolpidem (AMBIEN) 5 MG tablet Take 1-2 tablets (5-10 mg total) by mouth at bedtime as needed for sleep. (Patient not taking: Reported on 04/23/2019) 30 tablet 2   No current facility-administered medications on file prior to visit.    Cardiovascular and other pertinent studies:  Echocardiogram 05/06/2020:  Moderately depressed LV systolic function with EF 37%. Left ventricle  cavity is normal in size. Moderate concentric hypertrophy of the left  ventricle. Hypokinetic global wall motion. Doppler evidence of grade I  (impaired) diastolic dysfunction, normal LAP. Calculated EF 37%.  Structurally normal mitral valve. Mild (Grade I) mitral regurgitation.  Mild pulmonic regurgitation.  EKG 05/03/2020: Sinus tachycardia 100 bpm Left atrial enlargement   CCTA 04/07/2020: 1. Coronary calcium score of 0. 2. Normal coronary origin with right dominance. 3. Normal coronary arteries. RECOMMENDATIONS: 1. No evidence of CAD (0%). Consider non-atherosclerotic causes of chest pain.   Recent labs: 03/11/2020: Glucose 89.  BUN/creatinine 10/1.19.  GFR N/A. Na/K 139/3.9.  Rest of the CMP normal. H/H 14/43. MCV 96.  Platelets 169. Cholesterol 153, triglycerides 92, HDL 52, LDL 82.    Review of Systems  Cardiovascular: Positive for dyspnea on exertion (Mild). Negative for chest pain, leg swelling, palpitations and syncope.         Vitals:   06/24/20 0849 06/24/20 0851  BP: (!) 169/93 (!) 155/98  Pulse: 97 90  Resp: 16   SpO2: 100% 100%     Body mass index is 34.26 kg/m. Filed Weights   06/24/20 0849  Weight: 232 lb (105.2 kg)     Objective:   Physical Exam Vitals and nursing note reviewed.  Constitutional:      General: He is not in acute distress. Neck:      Vascular: No JVD.  Cardiovascular:     Rate and Rhythm: Normal rate and regular rhythm.     Heart sounds: Normal heart sounds. No murmur heard.   Pulmonary:     Effort: Pulmonary effort is normal.     Breath sounds: Normal breath sounds. No wheezing or rales.          Assessment & Recommendations:    59 year old African-American male with hypertension, hyperlipidemia, COPD, HIV.  Hypertension: Blood pressure remains elevated on metoprolol succinate 50 mg daily, likely exacerbated by neck pain and recent use of prednisone.  He is intolerant to multiple medications, including amlodipine, nifedipine, HCTZ, losartan, carvedilol. For now, will increase metoprolol succinate to 100 mg daily.  In future, could add Bidil.   Hypertensive cardiomyopathy: EF around 45%. Normal coronaries. 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 No contraindication to  driving.   F/u in 4 weeks   Nigel Mormon, MD Pager: [phone] Office: [phone]

## 2020-06-29 ENCOUNTER — Encounter: Payer: Self-pay | Admitting: Internal Medicine

## 2020-06-29 NOTE — Assessment & Plan Note (Signed)
stable overall by history and exam, recent data reviewed with pt, and pt to continue medical treatment as before,  to f/u any worsening symptoms or concerns  

## 2020-06-29 NOTE — Assessment & Plan Note (Signed)

## 2020-06-29 NOTE — Assessment & Plan Note (Addendum)
Uncontrolled, pt wants to f/u with cards tomorrow as appt planned BP Readings from Last 3 Encounters:  06/24/20 (!) 155/98  06/23/20 (!) 142/90  05/20/20 122/80

## 2020-06-29 NOTE — Assessment & Plan Note (Signed)
Lab Results  Component Value Date   LDLCALC 97 05/25/2020  stable overall by history and exam, recent data reviewed with pt, and pt to continue medical treatment as before,  to f/u any worsening symptoms or concerns

## 2020-06-29 NOTE — Progress Notes (Signed)
Established Patient Office Visit  Subjective:  Patient ID: Casey Brennan, male    DOB: [birth year]  Age: 59 y.o. MRN: [id number]       Chief Complaint: (concise statement describing the symptom, problem, condition, diagnosis, physician recommended return, or other factor as reason for encounter): wellness and uncontrolled htn, hypersomnolence, hld, rml pna, elevated TSH        HPI:  Casey Brennan is a 59 y.o. male here for wellness overall doing ok;  Pt denies Chest pain, worsening SOB, DOE, wheezing, orthopnea, PND, worsening LE edema, palpitations, dizziness or syncope.  Pt denies neurological change such as new headache, facial or extremity weakness.  Pt denies polydipsia, polyuria. Pt states overall good compliance with treatment and medications, good medication tolerability, and has been trying to follow appropriate diet.  Pt denies worsening depressive symptoms to me though has been feeling down on several days recently, but no suicidal ideation or panic. Denies fever, cough, night sweats, wt loss, loss of appetite, or other constitutional symptoms.  Pt states good ability with ADL's, has low fall risk, home safety reviewed and adequate, no other significant changes in hearing or vision, and occasionally active with exercise. Denies hyper or hypo thyroid symptoms such as voice, skin or hair change.  Of note, BP elevated several days recent in the 165-110 range, amlodipine has caused swelling, has been coreg intolerant, and pt declines change today, to f/u cardiology tomorrow         Wt Readings from Last 3 Encounters:  06/24/20 232 lb (105.2 kg)  06/23/20 233 lb (105.7 kg)  05/20/20 225 lb (102.1 kg)   BP Readings from Last 3 Encounters:  06/24/20 (!) 155/98  06/23/20 (!) 142/90  05/20/20 122/80        Also c/o acute problem of: worsening 2 mo daytime somnolence and fatigue, some snoring at night as well;  Also mentions has 2 wks onset left cervical radiculitis after fall followed by  ortho at [facility]. Past Medical History:  Diagnosis Date  . Arthritis    r knee   . Asthma    very rare  . Axillary lymphadenopathy   . Bronchitis   . Chronic anemia    normocytic  . Chronic folliculitis   . Elevated PSA   . Environmental and seasonal allergies   . GERD (gastroesophageal reflux disease)    Patient denies  . Gross hematuria   . H/O pericarditis    2010--  myopercarditis--  resolved  . HCAP (healthcare-associated pneumonia) 11/19/2014  . Headache(784.0)    HX SEVERE FRONTAL HA'S  . Hiatal hernia   . History of concussion    2012  &  2013  RESIDUAL HA'S --  RESOLVED  . History of gastric ulcer   . History of kidney stones   . History of MRSA infection 2010   infected boil  . HIV (human immunodeficiency virus infection) ([place]) 1988  . Hypertension   . Internal hemorrhoids   . Lytic bone lesion of hip    WORK-UP DONE BY ONCOLOGIST DR HA --  NOT MALIGNANT  . 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 Pneumonia    hx of  . Post concussion syndrome    resolved  . Prostate cancer ([place]) 04/25/13   gleason 3+3=6, volume 30 gm  . Sinusitis, chronic 05/14/2015  . Ulcer    hx of gastric  . Wears glasses    Past Surgical History:  Procedure Laterality Date  . CARDIAC  CATHETERIZATION  03-23-2009  DR COOPER   NORMAL CORONARY ARTERIES  . CHOLECYSTECTOMY N/A 11/06/2014   Procedure: LAPAROSCOPIC CHOLECYSTECTOMY WITH INTRAOPERATIVE CHOLANGIOGRAM;  Surgeon: Judeth Horn, MD;  Location: [facility];  Service: General;  Laterality: N/A;  . COLONOSCOPY  12/26/2011   Procedure: COLONOSCOPY;  Surgeon: Lear Ng, MD;  Location: [facility];  Service: Endoscopy;  Laterality: N/A;  . COLONOSCOPY    . COLONOSCOPY WITH PROPOFOL N/A 07/29/2014   Procedure: COLONOSCOPY WITH PROPOFOL;  Surgeon: Lear Ng, MD;  Location: [facility];  Service: Endoscopy;  Laterality: N/A;  . CYSTOSCOPY N/A 05/18/2018   Procedure: CYSTOSCOPY REMOVAL FOREIGN BODY;  Surgeon: Kathie Rhodes, MD;  Location: [facility];  Service: Urology;  Laterality: N/A;  . CYSTOSCOPY/RETROGRADE/URETEROSCOPY Bilateral 04/25/2013   Procedure: CYSTOSCOPY/ BILATERAL RETROGRADES; BLADDER BIOPSIES;  Surgeon: Alexis Frock, MD;  Location[facility];  Service: Urology;  Laterality: Bilateral;  . DENTAL EXAMINATION UNDER ANESTHESIA    . ESOPHAGOGASTRODUODENOSCOPY  12/26/2011   Procedure: ESOPHAGOGASTRODUODENOSCOPY (EGD);  Surgeon: Lear Ng, MD;  Location: [facility];  Service: Endoscopy;  Laterality: N/A;  . ESOPHAGOGASTRODUODENOSCOPY (EGD) WITH PROPOFOL N/A 07/29/2014   Procedure: ESOPHAGOGASTRODUODENOSCOPY (EGD) WITH PROPOFOL;  Surgeon: Lear Ng, MD;  Location: [facility];  Service: Endoscopy;  Laterality: N/A;  . EXCISION CHRONIC LEFT BREAST ABSCESS  09-21-2010  . EXCISIONAL BX LEFT BREAST MASS/  I  &  D LEFT BREAST ABSCESS  03-24-2009  . KNEE ARTHROSCOPY Right 1985  . left axilla biopsy  04/2013  . left hip biopsy  10/2012  . LYMPHADENECTOMY Bilateral 08/18/2013   Procedure: LYMPHADENECTOMY "PELVIC LYMPH NODE DISSECTION";  Surgeon: Alexis Frock, MD;  Location: [facility];  Service: Urology;  Laterality: Bilateral;  . PROSTATE BIOPSY N/A 04/25/2013   Procedure: BIOPSY TRANSRECTAL ULTRASONIC PROSTATE (TUBP);  Surgeon: Alexis Frock, MD;  Location: [facility];  Service: Urology;  Laterality: N/A;  . ROBOT ASSISTED LAPAROSCOPIC RADICAL PROSTATECTOMY N/A 08/18/2013   Procedure: ROBOTIC ASSISTED LAPAROSCOPIC RADICAL PROSTATECTOMY;  Surgeon: Alexis Frock, MD;  Location: [facility];  Service: Urology;  Laterality: N/A;  . TOTAL KNEE ARTHROPLASTY Right 08/01/2016   08/17/16  . TOTAL KNEE ARTHROPLASTY Right 08/01/2016   Procedure: TOTAL KNEE ARTHROPLASTY;  Surgeon: Renette Butters, MD;  Location: [facility];  Service: Orthopedics;  Laterality: Right;  . UPPER GASTROINTESTINAL ENDOSCOPY    . UPPER LEG SOFT TISSUE BIOPSY Left 2012   lthigh    reports that he  quit smoking about 10 years ago. His smoking use included cigars and cigarettes. He has a 34.50 pack-year smoking history. He has never used smokeless tobacco. He reports current alcohol use. He reports that he does not use drugs. family history includes Asthma in his father and sister; Cancer in his father and maternal uncle; Diabetes in his father; Hypertension in his sister; Stroke in his father. Allergies  Allergen Reactions  . Chocolate Shortness Of Breath and Other (See Comments)    Asthma attack, welts  . Descovy [Emtricitabine-Tenofovir Af] Shortness Of Breath    Nasuea, vomiiting, diarrhea, sob, whelps, throat closes  . Genvoya [Elviteg-Cobic-Emtricit-Tenofaf] Shortness Of Breath    Nausea, vomitting, diarreha, sob, rash, throat closing  . Morphine And Related Shortness Of Breath, Itching and Other (See Comments)     chest pain  . Penicillins Shortness Of Breath and Rash    Has patient had a PCN reaction causing immediate rash, facial/tongue/throat swelling, SOB or lightheadedness with hypotension: Yes Has patient had a PCN reaction causing  severe rash involving mucus membranes or skin necrosis: No Has patient had a PCN reaction that required hospitalization No Has patient had a PCN reaction occurring within the last 10 years: Yes If all of the above answers are "NO", then may proceed with Cephalosporin use.  . Sulfa Antibiotics Anaphylaxis  . Tomato Shortness Of Breath  . Vancomycin Shortness Of Breath  . Carvedilol     LEG SWELLING, DIZZNESS  . Flagyl [Metronidazole] Itching  . Milk-Related Compounds   . Toradol [Ketorolac Tromethamine] Nausea And Vomiting    itching  . Tramadol Itching and Nausea And Vomiting   Current Outpatient Medications on File Prior to Visit  Medication Sig Dispense Refill  . albuterol (VENTOLIN HFA) 108 (90 Base) MCG/ACT inhaler INHALE 2 PUFFS INTO THE LUNGS EVERY 6 HOURS AS NEEDED FOR WHEEZING OR SHORTNESS OF BREATH 36 g 3  . ALPRAZolam (XANAX) 0.5  MG tablet Take 1 tablet (0.5 mg total) by mouth 2 (two) times daily as needed for anxiety. 90 tablet 1  . aspirin EC 81 MG tablet Take 81 mg by mouth every Monday, Wednesday, and Friday.     . Beclomethasone Dipropionate (QNASL) 80 MCG/ACT AERS Place 2 sprays into the nose daily. 10.6 g 0  . cetirizine (ZYRTEC) 10 MG tablet 1 tablet    . diclofenac Sodium (VOLTAREN) 1 % GEL APPLY 2 GRAMS EXTERNALLY TO THE AFFECTED AREA TWICE DAILY AS NEEDED FOR PAIN 100 g 0  . EPINEPHrine 0.3 mg/0.3 mL IJ SOAJ injection Inject 0.3 mLs (0.3 mg total) into the muscle as needed for anaphylaxis. 1 each 3  . gabapentin (NEURONTIN) 300 MG capsule Take 1 capsule (300 mg total) by mouth 4 (four) times daily as needed (pain). 120 capsule 11  . Glucos-Chond-Hyal Ac-Ca Fructo (MOVE FREE JOINT HEALTH ADVANCE PO) Take by mouth daily.    [name] HYDROcodone-acetaminophen (NORCO/VICODIN) 5-325 MG tablet Take 1 tablet by mouth every 6 (six) hours as needed for moderate pain.    . hydrOXYzine (ATARAX/VISTARIL) 25 MG tablet Take 1 tablet (25 mg total) by mouth at bedtime. 90 tablet 3  . ipratropium (ATROVENT) 0.02 % nebulizer solution USE 2.5 ML(0.5 MG) VIA NEBULIZER THREE TIMES DAILY AS NEEDED FOR WHEEZING OR SHORTNESS OF BREATH (Patient taking differently: Take 0.5 mg by nebulization 3 (three) times daily as needed for wheezing or shortness of breath.) 1650 mL 1  . ISENTRESS 400 MG tablet TAKE 2 TABLETS(800 MG) BY MOUTH AT BEDTIME 60 tablet 5  . mometasone-formoterol (DULERA) 200-5 MCG/ACT AERO INHALE 2 PUFFS BY MOUTH EVERY MORNING AND 2 PUFFS 12 HOURS LATER 13 g 0  . Multiple Vitamin (MULTIVITAMIN WITH MINERALS) TABS tablet Take 1 tablet by mouth daily after breakfast.     . mupirocin ointment (BACTROBAN) 2 % APPLY EXTERNALLY TO THE AFFECTED AREA TWICE DAILY FOR 3 WEEKS, PLACE COTTON BETWEEN TOES TO ALLOW BETTER AERATION AS NEEDED 66 g 1  . omeprazole (PRILOSEC) 40 MG capsule 1 capsule 30 minutes before morning meal    . Polyethyl  Glycol-Propyl Glycol (SYSTANE) 0.4-0.3 % GEL ophthalmic gel Place 1 application into both eyes 2 (two) times daily.     . SUMAtriptan (IMITREX) 100 MG tablet TAKE 1 TABLET BY MOUTH EVERY 2 HOURS AS NEEDED FOR MIGRAINE, MAY REPEAT IN 2 HOURS IF HEADACHE PERSISTS OR RECURS 10 tablet 2  . tizanidine (ZANAFLEX) 6 MG capsule Take 1 capsule (6 mg total) by mouth 3 (three) times daily. 90 capsule 11  . torsemide (DEMADEX) 20 MG tablet  Take 20 mg by mouth daily as needed.    . TRUVADA 200-300 MG tablet Take 1 tablet by mouth daily. 30 tablet 5  . [DISCONTINUED] zolpidem (AMBIEN) 5 MG tablet Take 1-2 tablets (5-10 mg total) by mouth at bedtime as needed for sleep. (Patient not taking: Reported on 04/23/2019) 30 tablet 2   No current facility-administered medications on file prior to visit.        ROS:  All others reviewed and negative.  Objective        PE:  BP (!) 142/90 (BP Location: Left Arm, Patient Position: Sitting, Cuff Size: Large)   Pulse 91   Temp 98.1 F (36.7 C) (Oral)   Ht 5\' 9"  (1.753 m)   Wt 233 lb (105.7 kg)   SpO2 94%   BMI 34.41 kg/m                 Constitutional: Pt appears in NAD               HENT: Head: NCAT.                Right Ear: External ear normal.                 Left Ear: External ear normal.                Eyes: . Pupils are equal, round, and reactive to light. Conjunctivae and EOM are normal               Nose: without d/c or deformity               Neck: Neck supple. Gross normal ROM               Cardiovascular: Normal rate and regular rhythm.                 Pulmonary/Chest: Effort normal and breath sounds without rales or wheezing.                Abd:  Soft, NT, ND, + BS, no organomegaly               Neurological: Pt is alert. At baseline orientation, motor grossly intact               Skin: Skin is warm. No rashes, no other new lesions, LE edema - none               Psychiatric: Pt behavior is normal without agitation   Assessment/Plan:  Casey Brennan is a 59 y.o. American 60 or [place] Native [3] Asian [4] Black or African American [2] White or Caucasian [1] male with  has a past medical history of Arthritis, Asthma, Axillary lymphadenopathy, Bronchitis, Chronic anemia, Chronic folliculitis, Elevated PSA, Environmental and seasonal allergies, GERD (gastroesophageal reflux disease), Gross hematuria, H/O pericarditis, HCAP (healthcare-associated pneumonia) (11/19/2014), Headache(784.0), Hiatal hernia, History of concussion, History of gastric ulcer, History of kidney stones, History of MRSA infection (2010), HIV (human immunodeficiency virus infection) ([facility]) (1988), Hypertension, Internal hemorrhoids, Lytic bone lesion of hip, 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   Assessment Plan  See notes Labs reviewed for each problem: Lab Results  Component Value Date   WBC 4.3 03/11/2020   HGB 14.5 03/11/2020   HCT 43.2 03/11/2020   PLT 169 03/11/2020   GLUCOSE 89 03/11/2020   CHOL 163 05/25/2020  TRIG 114 05/25/2020   HDL 45 05/25/2020   LDLCALC 97 05/25/2020   ALT 21 03/11/2020   AST 21 03/11/2020   NA 141 03/11/2020   K 3.9 03/11/2020   CL 107 03/11/2020   CREATININE 1.19 03/11/2020   BUN 10 03/11/2020   CO2 26 03/11/2020   TSH 3.29 05/20/2020   PSA <0.1 03/11/2020   INR 0.95 01/16/2017   HGBA1C (L) 10/10/2010    4.3 (NOTE)                                                                       According to the ADA Clinical Practice Recommendations for 2011, when HbA1c is used as a screening test:   >=6.5%   Diagnostic of Diabetes Mellitus           (if abnormal result  is confirmed)  5.7-6.4%   Increased risk of developing Diabetes Mellitus  References:Diagnosis and Classification of Diabetes Mellitus,Diabetes [id number] 1):S62-S69 and Standards of Medical Care in         Diabetes -  2011,Diabetes Care,2011,34  (Suppl 1):S11-S61.   MICROALBUR 16.88 (H) 10/05/2011    Micro: none  Cardiac tracings I have personally interpreted today:  none  Pertinent Radiological findings (summarize): none   I spent total 34 minutes in addition to wellness in caring for the patient for this visit:  1) by communicating with the patient during the visit  2) by review of pertinent vital sign data, physical examination and labs as documented in the assessment and plan  3) by review of pertinent imaging - 05/20/20 cxr  4) by review of pertinent procedures - 07/07/19 echo  5) by obtaining and reviewing separately obtained information from family/caretaker and Care Everywhere - none today  6) by ordering medications  7) by ordering tests  8) by documenting all of this clinical information in the EHR including the management of each problem noted today in assessment and plan   There are no preventive care reminders to display for this patient.  There are no preventive care reminders to display for this patient.   Problem List Items Addressed This Visit      High   COPD GOLD III    stable overall by history and exam, recent data reviewed with pt, and pt to continue medical treatment as before,  to f/u any worsening symptoms or concerns       Relevant Medications   cetirizine (ZYRTEC) 10 MG tablet     Medium   Primary hypertension    Uncontrolled, pt wants to f/u with cards tomorrow as appt planned BP Readings from Last 3 Encounters:  06/24/20 (!) 155/98  06/23/20 (!) 142/90  05/20/20 122/80        Mixed hyperlipidemia    Lab Results  Component Value Date   LDLCALC 97 05/25/2020  stable overall by history and exam, recent data reviewed with pt, and pt to continue medical treatment as before,  to f/u any worsening symptoms or concerns       Hypersomnolence    ? Sleep apnea - ok for refer back to Dr Alva/pulm      Relevant Orders   Ambulatory referral to  Pulmonology   Elevated TSH  Most recent tsh normal, now further tx needed  Lab Results  Component Value Date   TSH 3.29 05/20/2020         Asthma    stable overall by history and exam, recent data reviewed with pt, and pt to continue medical treatment as before,  to f/u any worsening symptoms or concerns        Low   Pneumonia of right middle lobe due to Streptococcus pneumoniae ([place])    Resolved, no need furhter tx at this time      Relevant Medications   cetirizine (ZYRTEC) 10 MG tablet     Unprioritized   Encounter for well adult exam with abnormal findings - Primary    Overall doing well, age appropriate education and counseling updated, referrals for preventative services and immunizations addressed, dietary and smoking counseling addressed, most recent labs reviewed.  I have personally reviewed and have noted:  1) the patient's medical and social history 2) The pt's use of alcohol, tobacco, and illicit drugs 3) The patient's current medications and supplements 4) Functional ability including ADL's, fall risk, home safety risk, hearing and visual impairment 5) Diet and physical activities 6) Evidence for depression or mood disorder 7) The patient's height, weight, and BMI have been recorded in the chart  I have made referrals, and provided counseling and education based on review of the above          Follow-up: 6 mo    Cathlean Cower, MD 06/29/2020 3:09 PM [facility]

## 2020-07-08 ENCOUNTER — Ambulatory Visit: Payer: Medicare Other | Admitting: Neurology

## 2020-07-12 ENCOUNTER — Ambulatory Visit: Payer: Medicare Other | Admitting: Cardiovascular Disease

## 2020-07-13 ENCOUNTER — Telehealth: Payer: Self-pay | Admitting: Pulmonary Disease

## 2020-07-13 DIAGNOSIS — J441 Chronic obstructive pulmonary disease with (acute) exacerbation: Secondary | ICD-10-CM | POA: Insufficient documentation

## 2020-07-13 MED ORDER — PREDNISONE 10 MG PO TABS: ORAL_TABLET | ORAL | 0 refills | Status: DC

## 2020-07-13 MED ORDER — AZITHROMYCIN 250 MG PO TABS: ORAL_TABLET | ORAL | 0 refills | Status: DC

## 2020-07-13 NOTE — Telephone Encounter (Signed)
We will treat as COPD exacerbation but would advise COVID testing Z-Pak  Prednisone 10 mg tabs  Take 2 tabs daily with food x 5ds, then 1 tab daily with food x 5ds then STOP

## 2020-07-13 NOTE — Telephone Encounter (Signed)
Primary Pulmonologist: Dr. Elsworth Soho Last office visit and with whom: 05/11/20 What do we see them for (pulmonary problems): COPD, HIV, cough Last OV assessment/plan:   Assessment & Plan:   COPD GOLD III Plan: Chest x-ray today Continue Dulera 200 Follow-up in 6 weeks to establish care with Dr. Elsworth Soho in a 30-minute time slot  Human immunodeficiency virus (HIV) disease ([place]) Plan: Continue follow-up with infectious disease Continue medications as outlined by infectious disease  Healthcare maintenance Plan: Recommend seasonal flu vaccine, patient declined Patient up-to-date with first 2 injections of COVID-19 vaccination  Cough Patient currently on Tussionex for management of cough.  He received a 5-day prescription refill earlier this month as well as a 7-day prescription refill from urgent care.  He has a total of 12 days.  Requesting refill today on day 8.  I discussed with patient as well as sister is here with him that we will not be refilling this.  He does appear that infectious disease has prescribed this in the past occasionally as patient reports allergies to over-the-counter cough medications.  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 Patient taking ventolin 2-3x a day 2 puffs each time.  Not taking OTC medication. Patient vaccinated and booster 05/2020. Patient has not had a covid test while experiencing symptoms.   Dr. Elsworth Soho please advise   Allergies  Allergen Reactions  . Chocolate Shortness Of Breath and Other (See Comments)    Asthma attack, welts   . Descovy [Emtricitabine-Tenofovir Af] Shortness Of Breath    Nasuea, vomiiting, diarrhea, sob, whelps, throat closes  . Genvoya [Elviteg-Cobic-Emtricit-Tenofaf] Shortness Of Breath    Nausea, vomitting, diarreha, sob, rash, throat closing  . Morphine And Related Shortness Of Breath, Itching and Other (See Comments)     chest pain  . Penicillins Shortness Of Breath and Rash    Has patient had a PCN reaction causing immediate rash, facial/tongue/throat swelling, SOB or lightheadedness with hypotension: Yes Has patient had a PCN reaction causing severe rash involving mucus membranes or skin necrosis: No Has patient had a PCN reaction that required hospitalization No Has patient had a PCN reaction occurring within the last 10 years: Yes If all of the above answers are "NO", then may proceed with Cephalosporin use.  . Sulfa Antibiotics Anaphylaxis  . Tomato Shortness Of Breath  . Vancomycin Shortness Of Breath  . Carvedilol     LEG SWELLING, DIZZNESS  . Flagyl [Metronidazole] Itching  . Milk-Related Compounds   . Toradol [Ketorolac Tromethamine] Nausea And Vomiting    itching  . Tramadol Itching and Nausea And Vomiting    Immunization History  Administered Date(s) Administered  . Hepatitis A 06/12/2011  . Influenza Split 06/12/2012  . Influenza Whole 04/19/2009, 07/25/2010, 02/22/2011  . Influenza,inj,Quad PF,6+ Mos 06/04/2013, 04/27/2014, 07/21/2015, 05/22/2016, 04/27/2017, 05/20/2020  . Influenza-Unspecified 01/30/2015  . Meningococcal Mcv4o 05/22/2016  . PFIZER SARS-COV-2 Vaccination 10/23/2019, 11/13/2019, 05/25/2020  . PPD Test 05/05/2009  . Pneumococcal Polysaccharide-23 03/22/2009, 08/19/2013  . Tdap 06/12/2011

## 2020-07-13 NOTE — Telephone Encounter (Signed)
Please advise him to use Delsym 5 mL thrice daily as needed for cough  We do not prescribe narcotics unless absolutely necessary

## 2020-07-13 NOTE — Telephone Encounter (Signed)
Spoke with pt's sister April (dpr on file), aware of recs.  zpak and prednisone sent to preferred pharmacy.  Reviewed locations for covid testing.  Sister April is also requesting refill on Tussionex- states that pt always takes this when he has a copd exacerbation.  I advised that this was not discussed in previous phone call and we would need to send message back to RA for clarification.    Last tussionex refill: 05/20/2020 #125mL with 0 refills, take 1 tab q12h prn cough.   I have pended rx in chart to preferred pharmacy. RA please advise on additional refill request.  Thanks!

## 2020-07-13 NOTE — Telephone Encounter (Signed)
Spoke with pt's sister April, aware of recs.  Nothing further needed at this time- will close encounter.

## 2020-07-14 ENCOUNTER — Other Ambulatory Visit: Payer: Self-pay

## 2020-07-14 ENCOUNTER — Encounter: Payer: Self-pay | Admitting: Cardiology

## 2020-07-14 ENCOUNTER — Ambulatory Visit: Payer: Medicare Other | Admitting: Cardiology

## 2020-07-14 VITALS — BP 126/85 | HR 108 | Resp 16 | Ht 69.0 in | Wt 232.0 lb

## 2020-07-14 DIAGNOSIS — E782 Mixed hyperlipidemia: Secondary | ICD-10-CM

## 2020-07-14 DIAGNOSIS — I10 Essential (primary) hypertension: Secondary | ICD-10-CM

## 2020-07-14 MED ORDER — METOPROLOL SUCCINATE ER 100 MG PO TB24: 100.0000 mg | ORAL_TABLET | Freq: Every day | ORAL | 3 refills | Status: DC

## 2020-07-14 NOTE — Progress Notes (Signed)
Patient referred by Biagio Borg, MD for hypertension  Subjective:   Casey Brennan, male    DOB: 09-Jun-1961, 60 y.o.   MRN: [id number]   Chief Complaint  Patient presents with  . Hypertension  . Follow-up    Hypertension Pertinent negatives include no chest pain or palpitations.    60 year old male with hypertension, hyperlipidemia, HIV.  Blood pressure now improved. He continues to have upper back pain and radiculopathy symptoms.  Initial consultation HPI 05/2020: Patient was recently seen by [facility] Dr. Eleonore Chiquito.  Prior to that, he was also seen by Drs. Harwani and [place].  His prior cardiac work-up showed coronary CTA with normal coronary arteries and calcium score 0.  He has not tolerated several antihypertensive medications in the past, including amlodipine, nifedipine, HCTZ, losartan, carvedilol. He is tolerating metoprolol succinate.  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 He uses Aspirin 325 mg on M,W,F during his trips.     Current Outpatient Medications on File Prior to Visit  Medication Sig Dispense Refill  . albuterol (VENTOLIN HFA) 108 (90 Base) MCG/ACT inhaler INHALE 2 PUFFS INTO THE LUNGS EVERY 6 HOURS AS NEEDED FOR WHEEZING OR SHORTNESS OF BREATH 36 g 3  . ALPRAZolam (XANAX) 0.5 MG tablet Take 1 tablet (0.5 mg total) by mouth 2 (two) times daily as needed for anxiety. 90 tablet 1  . aspirin EC 81 MG tablet Take 81 mg by mouth every Monday, Wednesday, and Friday.     [name] azithromycin (ZITHROMAX) 250 MG tablet Take 2 tablets today, then 1 daily until gone. 6 tablet 0  . Beclomethasone Dipropionate (QNASL) 80 MCG/ACT AERS Place 2 sprays into the nose daily. 10.6 g 0  . cetirizine (ZYRTEC) 10 MG tablet 1 tablet    . diclofenac Sodium (VOLTAREN) 1 % GEL APPLY 2 GRAMS EXTERNALLY TO THE AFFECTED AREA TWICE DAILY AS NEEDED FOR PAIN 100 g 0  . EPINEPHrine 0.3 mg/0.3 mL IJ SOAJ injection Inject 0.3 mLs (0.3 mg total)  into the muscle as needed for anaphylaxis. 1 each 3  . gabapentin (NEURONTIN) 300 MG capsule Take 1 capsule (300 mg total) by mouth 4 (four) times daily as needed (pain). 120 capsule 11  . Glucos-Chond-Hyal Ac-Ca Fructo (MOVE FREE JOINT HEALTH ADVANCE PO) Take by mouth daily.    [name] HYDROcodone-acetaminophen (NORCO/VICODIN) 5-325 MG tablet Take 1 tablet by mouth every 6 (six) hours as needed for moderate pain.    . hydrOXYzine (ATARAX/VISTARIL) 25 MG tablet Take 1 tablet (25 mg total) by mouth at bedtime. 90 tablet 3  . ipratropium (ATROVENT) 0.02 % nebulizer solution USE 2.5 ML(0.5 MG) VIA NEBULIZER THREE TIMES DAILY AS NEEDED FOR WHEEZING OR SHORTNESS OF BREATH (Patient taking differently: Take 0.5 mg by nebulization 3 (three) times daily as needed for wheezing or shortness of breath.) 1650 mL 1  . ISENTRESS 400 MG tablet TAKE 2 TABLETS(800 MG) BY MOUTH AT BEDTIME 60 tablet 5  . metoprolol succinate (TOPROL-XL) 100 MG 24 hr tablet Take 1 tablet (100 mg total) by mouth daily. 30 tablet 1  . mometasone-formoterol (DULERA) 200-5 MCG/ACT AERO INHALE 2 PUFFS BY MOUTH EVERY MORNING AND 2 PUFFS 12 HOURS LATER 13 g 0  . Multiple Vitamin (MULTIVITAMIN WITH MINERALS) TABS tablet Take 1 tablet by mouth daily after breakfast.     . mupirocin ointment (BACTROBAN) 2 % APPLY EXTERNALLY TO THE AFFECTED AREA TWICE DAILY FOR 3 WEEKS, PLACE COTTON BETWEEN  TOES TO ALLOW BETTER AERATION AS NEEDED 66 g 1  . omeprazole (PRILOSEC) 40 MG capsule 1 capsule 30 minutes before morning meal    . Polyethyl Glycol-Propyl Glycol (SYSTANE) 0.4-0.3 % GEL ophthalmic gel Place 1 application into both eyes 2 (two) times daily.     . predniSONE (DELTASONE) 10 MG tablet 2 tabs daily X5 days, then 1 tab daily X5 days. 15 tablet 0  . SUMAtriptan (IMITREX) 100 MG tablet TAKE 1 TABLET BY MOUTH EVERY 2 HOURS AS NEEDED FOR MIGRAINE, MAY REPEAT IN 2 HOURS IF HEADACHE PERSISTS OR RECURS 10 tablet 2  . tizanidine (ZANAFLEX) 6 MG capsule Take 1  capsule (6 mg total) by mouth 3 (three) times daily. 90 capsule 11  . torsemide (DEMADEX) 20 MG tablet Take 20 mg by mouth daily as needed.    . TRUVADA 200-300 MG tablet Take 1 tablet by mouth daily. 30 tablet 5  . [DISCONTINUED] zolpidem (AMBIEN) 5 MG tablet Take 1-2 tablets (5-10 mg total) by mouth at bedtime as needed for sleep. (Patient not taking: Reported on 04/23/2019) 30 tablet 2   No current facility-administered medications on file prior to visit.    Cardiovascular and other pertinent studies:  Echocardiogram 05/06/2020:  Moderately depressed LV systolic function with EF 37%. Left ventricle  cavity is normal in size. Moderate concentric hypertrophy of the left  ventricle. Hypokinetic global wall motion. Doppler evidence of grade I  (impaired) diastolic dysfunction, normal LAP. Calculated EF 37%.  Structurally normal mitral valve. Mild (Grade I) mitral regurgitation.  Mild pulmonic regurgitation.  EKG 05/03/2020: Sinus tachycardia 100 bpm Left atrial enlargement   CCTA 04/07/2020: 1. Coronary calcium score of 0. 2. Normal coronary origin with right dominance. 3. Normal coronary arteries. RECOMMENDATIONS: 1. No evidence of CAD (0%). Consider non-atherosclerotic causes of chest pain.   Recent labs: 03/11/2020: Glucose 89.  BUN/creatinine 10/1.19.  GFR N/A. Na/K 139/3.9.  Rest of the CMP normal. H/H 14/43. MCV 96.  Platelets 169. Cholesterol 153, triglycerides 92, HDL 52, LDL 82.    Review of Systems  Cardiovascular: Positive for dyspnea on exertion (Mild). Negative for chest pain, leg swelling, palpitations and syncope.         Vitals:   07/14/20 1347  BP: 126/85  Pulse: (!) 108  Resp: 16  SpO2: 92%     Body mass index is 34.26 kg/m. Filed Weights   07/14/20 1347  Weight: 232 lb (105.2 kg)     Objective:   Physical Exam Vitals and nursing note reviewed.  Constitutional:      General: He is not in acute distress. Neck:     Vascular: No JVD.   Cardiovascular:     Rate and Rhythm: Normal rate and regular rhythm.     Heart sounds: Normal heart sounds. No murmur heard.   Pulmonary:     Effort: Pulmonary effort is normal.     Breath sounds: Normal breath sounds. No wheezing or rales.          Assessment & Recommendations:    60 year old African-American male with hypertension, hyperlipidemia, COPD, HIV.  Hypertension: Now well controlled on metoprolol succinate 100 mg daily. He is intolerant to multiple medications, including amlodipine, nifedipine, HCTZ, losartan, carvedilol.  Hypertensive cardiomyopathy: EF around 45%. Normal coronaries. 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 No contraindication to driving.   F/u in 3 months   Nigel Mormon, MD Pager: [phone] Office: [phone]

## 2020-08-04 ENCOUNTER — Ambulatory Visit: Payer: Medicare Other | Admitting: Pulmonary Disease

## 2020-08-05 DIAGNOSIS — M5412 Radiculopathy, cervical region: Secondary | ICD-10-CM | POA: Diagnosis not present

## 2020-08-05 DIAGNOSIS — M542 Cervicalgia: Secondary | ICD-10-CM | POA: Diagnosis not present

## 2020-08-12 ENCOUNTER — Ambulatory Visit: Payer: Medicare Other | Admitting: Cardiology

## 2020-08-12 ENCOUNTER — Encounter: Payer: Self-pay | Admitting: Cardiology

## 2020-08-12 ENCOUNTER — Other Ambulatory Visit: Payer: Self-pay

## 2020-08-12 VITALS — BP 147/93 | HR 79 | Temp 98.3°F | Resp 16 | Ht 69.0 in | Wt 235.2 lb

## 2020-08-12 DIAGNOSIS — M5412 Radiculopathy, cervical region: Secondary | ICD-10-CM | POA: Diagnosis not present

## 2020-08-12 DIAGNOSIS — I10 Essential (primary) hypertension: Secondary | ICD-10-CM | POA: Diagnosis not present

## 2020-08-12 NOTE — Progress Notes (Signed)
Patient referred by Biagio Borg, MD for hypertension  Subjective:   Casey Brennan, male    DOB: 1960-10-16, 60 y.o.   MRN: [id number]   Chief Complaint  Patient presents with  . Hypertension  . Follow-up    3 months    Hypertension Pertinent negatives include no chest pain or palpitations.    60 year old male with hypertension, hyperlipidemia, HIV.  Blood pressure is elevated today. He has been having a lot of pain due to his radiculopathy. He is seeing a Psychologist, sport and exercise and contemplating surgical intervention   Initial consultation HPI 05/2020: Patient was recently seen by [facility] Dr. Eleonore Chiquito.  Prior to that, he was also seen by Drs. Harwani and [place].  His prior cardiac work-up showed coronary CTA with normal coronary arteries and calcium score 0.  He has not tolerated several antihypertensive medications in the past, including amlodipine, nifedipine, HCTZ, losartan, carvedilol. He is tolerating metoprolol succinate.  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 He uses Aspirin 325 mg on M,W,F during his trips.     Current Outpatient Medications on File Prior to Visit  Medication Sig Dispense Refill  . albuterol (VENTOLIN HFA) 108 (90 Base) MCG/ACT inhaler INHALE 2 PUFFS INTO THE LUNGS EVERY 6 HOURS AS NEEDED FOR WHEEZING OR SHORTNESS OF BREATH 36 g 3  . ALPRAZolam (XANAX) 0.5 MG tablet Take 1 tablet (0.5 mg total) by mouth 2 (two) times daily as needed for anxiety. 90 tablet 1  . aspirin EC 81 MG tablet Take 81 mg by mouth every Monday, Wednesday, and Friday.     [name] azithromycin (ZITHROMAX) 250 MG tablet Take 2 tablets today, then 1 daily until gone. 6 tablet 0  . Beclomethasone Dipropionate (QNASL) 80 MCG/ACT AERS Place 2 sprays into the nose daily. 10.6 g 0  . cetirizine (ZYRTEC) 10 MG tablet 1 tablet    . diclofenac Sodium (VOLTAREN) 1 % GEL APPLY 2 GRAMS EXTERNALLY TO THE AFFECTED AREA TWICE DAILY AS NEEDED FOR PAIN 100 g 0  .  EPINEPHrine 0.3 mg/0.3 mL IJ SOAJ injection Inject 0.3 mLs (0.3 mg total) into the muscle as needed for anaphylaxis. 1 each 3  . gabapentin (NEURONTIN) 300 MG capsule Take 1 capsule (300 mg total) by mouth 4 (four) times daily as needed (pain). 120 capsule 11  . Glucos-Chond-Hyal Ac-Ca Fructo (MOVE FREE JOINT HEALTH ADVANCE PO) Take by mouth daily.    [name] HYDROcodone-acetaminophen (NORCO/VICODIN) 5-325 MG tablet Take 1 tablet by mouth every 6 (six) hours as needed for moderate pain.    . hydrOXYzine (ATARAX/VISTARIL) 25 MG tablet Take 1 tablet (25 mg total) by mouth at bedtime. 90 tablet 3  . ipratropium (ATROVENT) 0.02 % nebulizer solution USE 2.5 ML(0.5 MG) VIA NEBULIZER THREE TIMES DAILY AS NEEDED FOR WHEEZING OR SHORTNESS OF BREATH (Patient taking differently: Take 0.5 mg by nebulization 3 (three) times daily as needed for wheezing or shortness of breath.) 1650 mL 1  . ISENTRESS 400 MG tablet TAKE 2 TABLETS(800 MG) BY MOUTH AT BEDTIME 60 tablet 5  . metoprolol succinate (TOPROL-XL) 100 MG 24 hr tablet Take 1 tablet (100 mg total) by mouth daily. 90 tablet 3  . mometasone-formoterol (DULERA) 200-5 MCG/ACT AERO INHALE 2 PUFFS BY MOUTH EVERY MORNING AND 2 PUFFS 12 HOURS LATER 13 g 0  . Multiple Vitamin (MULTIVITAMIN WITH MINERALS) TABS tablet Take 1 tablet by mouth daily after breakfast.     . mupirocin  ointment (BACTROBAN) 2 % APPLY EXTERNALLY TO THE AFFECTED AREA TWICE DAILY FOR 3 WEEKS, PLACE COTTON BETWEEN TOES TO ALLOW BETTER AERATION AS NEEDED 66 g 1  . omeprazole (PRILOSEC) 40 MG capsule 1 capsule 30 minutes before morning meal    . Polyethyl Glycol-Propyl Glycol (SYSTANE) 0.4-0.3 % GEL ophthalmic gel Place 1 application into both eyes 2 (two) times daily.     . SUMAtriptan (IMITREX) 100 MG tablet TAKE 1 TABLET BY MOUTH EVERY 2 HOURS AS NEEDED FOR MIGRAINE, MAY REPEAT IN 2 HOURS IF HEADACHE PERSISTS OR RECURS 10 tablet 2  . tizanidine (ZANAFLEX) 6 MG capsule Take 1 capsule (6 mg total) by mouth 3  (three) times daily. 90 capsule 11  . torsemide (DEMADEX) 20 MG tablet Take 20 mg by mouth daily as needed.    . TRUVADA 200-300 MG tablet Take 1 tablet by mouth daily. 30 tablet 5  . predniSONE (DELTASONE) 10 MG tablet 2 tabs daily X5 days, then 1 tab daily X5 days. 15 tablet 0  . [DISCONTINUED] zolpidem (AMBIEN) 5 MG tablet Take 1-2 tablets (5-10 mg total) by mouth at bedtime as needed for sleep. (Patient not taking: Reported on 04/23/2019) 30 tablet 2   No current facility-administered medications on file prior to visit.    Cardiovascular and other pertinent studies:  Echocardiogram 05/06/2020:  Moderately depressed LV systolic function with EF 37%. Left ventricle  cavity is normal in size. Moderate concentric hypertrophy of the left  ventricle. Hypokinetic global wall motion. Doppler evidence of grade I  (impaired) diastolic dysfunction, normal LAP. Calculated EF 37%.  Structurally normal mitral valve. Mild (Grade I) mitral regurgitation.  Mild pulmonic regurgitation.  EKG 05/03/2020: Sinus tachycardia 100 bpm Left atrial enlargement   CCTA 04/07/2020: 1. Coronary calcium score of 0. 2. Normal coronary origin with right dominance. 3. Normal coronary arteries. RECOMMENDATIONS: 1. No evidence of CAD (0%). Consider non-atherosclerotic causes of chest pain.   Recent labs: 03/11/2020: Glucose 89.  BUN/creatinine 10/1.19.  GFR N/A. Na/K 139/3.9.  Rest of the CMP normal. H/H 14/43. MCV 96.  Platelets 169. Cholesterol 153, triglycerides 92, HDL 52, LDL 82.    Review of Systems  Cardiovascular: Positive for dyspnea on exertion (Mild). Negative for chest pain, leg swelling, palpitations and syncope.         Vitals:   08/12/20 1358  BP: (!) 147/93  Pulse: 79  Resp: 16  Temp: 98.3 F (36.8 C)  SpO2: 97%     Body mass index is 34.73 kg/m. Filed Weights   08/12/20 1358  Weight: 235 lb 3.2 oz (106.7 kg)     Objective:   Physical Exam Vitals and nursing note  reviewed.  Constitutional:      General: He is not in acute distress. Neck:     Vascular: No JVD.  Cardiovascular:     Rate and Rhythm: Normal rate and regular rhythm.     Heart sounds: Normal heart sounds. No murmur heard.   Pulmonary:     Effort: Pulmonary effort is normal.     Breath sounds: Normal breath sounds. No wheezing or rales.          Assessment & Recommendations:    60 year old African-American male with hypertension, hyperlipidemia, COPD, HIV.  Hypertension: Continue metoprolol succinate 100 mg daily. He is intolerant to multiple medications, including amlodipine, nifedipine, HCTZ, losartan, carvedilol. Continue pain control   Hypertensive cardiomyopathy: EF around 45%. Normal coronaries. 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 No contraindication to driving.  F/u in 3 months   Nigel Mormon, MD Pager: [phone] Office: [phone]

## 2020-08-13 ENCOUNTER — Telehealth: Payer: Self-pay | Admitting: Pulmonary Disease

## 2020-08-13 NOTE — Telephone Encounter (Signed)
Please provide office visit with APP

## 2020-08-13 NOTE — Telephone Encounter (Signed)
appt scheduled for 08/16/2020 at 11:00 with [name], NP. Patient's sister, April(DPR) is aware and voiced her understanding.  Nothing further needed.

## 2020-08-13 NOTE — Telephone Encounter (Signed)
Spoke to patient's sister, April(DPR).  April stated that patient completed course of prednisone and Biaxin about two weeks ago.  Patient has lingering wheezing, crackling, nasal drainage with green to yellow occasionally mixed with blood, occasional dry cough and sob with exertion. symptoms have been present longer then a month.  Denied f/s/c or additional symptoms. He is using tessalon, mucinex daily and Atrovent neb solution BID  He has had all three covid vaccines and flu shot.  Dr. Elsworth Soho, please advise. Thanks

## 2020-08-16 ENCOUNTER — Ambulatory Visit (INDEPENDENT_AMBULATORY_CARE_PROVIDER_SITE_OTHER): Payer: Medicare Other

## 2020-08-16 ENCOUNTER — Other Ambulatory Visit: Payer: Self-pay

## 2020-08-16 ENCOUNTER — Encounter: Payer: Self-pay | Admitting: Primary Care

## 2020-08-16 ENCOUNTER — Ambulatory Visit (INDEPENDENT_AMBULATORY_CARE_PROVIDER_SITE_OTHER): Payer: Medicare Other | Admitting: Primary Care

## 2020-08-16 ENCOUNTER — Encounter: Payer: Self-pay | Admitting: *Deleted

## 2020-08-16 VITALS — BP 130/80 | HR 74 | Temp 98.0°F | Ht 69.0 in | Wt 233.8 lb

## 2020-08-16 DIAGNOSIS — J449 Chronic obstructive pulmonary disease, unspecified: Secondary | ICD-10-CM

## 2020-08-16 DIAGNOSIS — J441 Chronic obstructive pulmonary disease with (acute) exacerbation: Secondary | ICD-10-CM

## 2020-08-16 MED ORDER — SPIRIVA RESPIMAT 2.5 MCG/ACT IN AERS: 2.0000 | INHALATION_SPRAY | Freq: Every day | RESPIRATORY_TRACT | 0 refills | Status: DC

## 2020-08-16 NOTE — Patient Instructions (Signed)
Recommendations: Continue Dulera two puffs twice a day (use with spacer) Adding Spiriva respimat take two puffs once daily in the morning  *if additional of spiriva helps, please call us or send mychart message and we can send in prescription  Orders: CXR today  Follow-up Dr. Melvyn Novas in 2-3 months

## 2020-08-16 NOTE — Progress Notes (Signed)
@Patient  ID: Casey Brennan, male    DOB: 1961/05/11, 61 y.o.   MRN: [id number]  Chief Complaint  Patient presents with  . Acute Visit    Pt has had complaints of wheezing, SOB, and cough which has been happening now x1 month. When pt coughs, pt will cough up thick yellow-green phlegm and also has some green-yellow nasal drainage with some occ blood in it.    Referring provider: Biagio Borg, MD  HPI: 60 year old male, former smoker quit in November 2011.   Past medical history sniffing for COPD Gold 3, asthma, pneumonia, chronic sinusitis, hypertension, GERD, chronic renal insufficiency.  Patient of Dr. Melvyn Novas, last seen by pulmonary nurse practitioner on 05/11/2020.  08/16/2020 Presents today for acute visit. Accompanied by his sister. He had pneumonia back in November 2021. Repeat CXR on 05/20/20 showed resolution RML infiltrate. Reports lingering symptoms of cough, wheezing and crackling in lungs. He was sent in [place] for suspected COPD exacerbation in mid-January 2021 but never started this because he went to [facility] and was given course of prednisone and Biaxin. He still has a cough which is occasionally productive with yellow frothy mucus and associated wheezing. He has been taking Best boy and Mucinex daily. He is using Albuterol rescue inhaler 4 times a day with some improvement. No fever, chills, sweats, chest tightness, N/V/D.   Allergies  Allergen Reactions  . Chocolate Shortness Of Breath and Other (See Comments)    Asthma attack, welts  . Descovy [Emtricitabine-Tenofovir Af] Shortness Of Breath    Nasuea, vomiiting, diarrhea, sob, whelps, throat closes  . Genvoya [Elviteg-Cobic-Emtricit-Tenofaf] Shortness Of Breath    Nausea, vomitting, diarreha, sob, rash, throat closing  . Morphine And Related Shortness Of Breath, Itching and Other (See Comments)     chest pain  . Penicillins Shortness Of Breath and Rash    Has patient had a PCN reaction causing immediate rash,  facial/tongue/throat swelling, SOB or lightheadedness with hypotension: Yes Has patient had a PCN reaction causing severe rash involving mucus membranes or skin necrosis: No Has patient had a PCN reaction that required hospitalization No Has patient had a PCN reaction occurring within the last 10 years: Yes If all of the above answers are "NO", then may proceed with Cephalosporin use.  . Sulfa Antibiotics Anaphylaxis  . Tomato Shortness Of Breath  . Vancomycin Shortness Of Breath  . Carvedilol     LEG SWELLING, DIZZNESS  . Flagyl [Metronidazole] Itching  . Milk-Related Compounds   . Toradol [Ketorolac Tromethamine] Nausea And Vomiting    itching  . Tramadol Itching and Nausea And Vomiting    Immunization History  Administered Date(s) Administered  . Hepatitis A 06/12/2011  . Influenza Split 06/12/2012  . Influenza Whole 04/19/2009, 07/25/2010, 02/22/2011  . Influenza,inj,Quad PF,6+ Mos 06/04/2013, 04/27/2014, 07/21/2015, 05/22/2016, 04/27/2017, 05/20/2020  . Influenza-Unspecified 01/30/2015  . Meningococcal Mcv4o 05/22/2016  . PFIZER(Purple Top)SARS-COV-2 Vaccination 10/23/2019, 11/13/2019, 05/25/2020  . PPD Test 05/05/2009  . Pneumococcal Polysaccharide-23 03/22/2009, 08/19/2013  . Tdap 06/12/2011    Past Medical History:  Diagnosis Date  . Arthritis    r knee   . Asthma    very rare  . Axillary lymphadenopathy   . Bronchitis   . Chronic anemia    normocytic  . Chronic folliculitis   . Elevated PSA   . Environmental and seasonal allergies   . GERD (gastroesophageal reflux disease)    Patient denies  . Gross hematuria   . H/O pericarditis    2010--  myopercarditis--  resolved  . HCAP (healthcare-associated pneumonia) 11/19/2014  . Headache(784.0)    HX SEVERE FRONTAL HA'S  . Hiatal hernia   . History of concussion    2012  &  2013  RESIDUAL HA'S --  RESOLVED  . History of gastric ulcer   . History of kidney stones   . History of MRSA infection 2010   infected  boil  . HIV (human immunodeficiency virus infection) ([place]) 1988  . Hypertension   . Internal hemorrhoids   . Lytic bone lesion of hip    WORK-UP DONE BY ONCOLOGIST DR HA --  NOT MALIGNANT  . 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 Pneumonia    hx of  . Post concussion syndrome    resolved  . Prostate cancer ([place]) 04/25/13   gleason 3+3=6, volume 30 gm  . Sinusitis, chronic 05/14/2015  . Ulcer    hx of gastric  . Wears glasses     Tobacco History: Social History   Tobacco Use  Smoking Status Former Smoker  . Packs/day: 1.50  . Years: 23.00  . Pack years: 34.50  . Types: Cigars, Cigarettes  . Quit date: 05/22/2010  . Years since quitting: 10.2  Smokeless Tobacco Never Used   Counseling given: Not Answered   Outpatient Medications Prior to Visit  Medication Sig Dispense Refill  . albuterol (VENTOLIN HFA) 108 (90 Base) MCG/ACT inhaler INHALE 2 PUFFS INTO THE LUNGS EVERY 6 HOURS AS NEEDED FOR WHEEZING OR SHORTNESS OF BREATH 36 g 3  . ALPRAZolam (XANAX) 0.5 MG tablet Take 1 tablet (0.5 mg total) by mouth 2 (two) times daily as needed for anxiety. (Patient taking differently: Take 0.5 mg by mouth 2 (two) times daily as needed for sleep.) 90 tablet 1  . aspirin EC 81 MG tablet Take 81 mg by mouth every Monday, Wednesday, and Friday.     . Beclomethasone Dipropionate (QNASL) 80 MCG/ACT AERS Place 2 sprays into the nose daily. 10.6 g 0  . cetirizine (ZYRTEC) 10 MG tablet 1 tablet    . cyclobenzaprine (FLEXERIL) 5 MG tablet Take 5 mg by mouth 2 (two) times daily.    . diclofenac Sodium (VOLTAREN) 1 % GEL APPLY 2 GRAMS EXTERNALLY TO THE AFFECTED AREA TWICE DAILY AS NEEDED FOR PAIN 100 g 0  . EPINEPHrine 0.3 mg/0.3 mL IJ SOAJ injection Inject 0.3 mLs (0.3 mg total) into the muscle as needed for anaphylaxis. 1 each 3  . gabapentin (NEURONTIN) 300 MG capsule Take 1 capsule (300 mg total) by mouth 4 (four) times daily as needed (pain). 120 capsule 11  . Glucos-Chond-Hyal Ac-Ca Fructo  (MOVE FREE JOINT HEALTH ADVANCE PO) Take by mouth daily.    [name] HYDROcodone-acetaminophen (NORCO/VICODIN) 5-325 MG tablet Take 1 tablet by mouth every 6 (six) hours as needed for moderate pain.    . hydrOXYzine (ATARAX/VISTARIL) 25 MG tablet Take 1 tablet (25 mg total) by mouth at bedtime. 90 tablet 3  . ipratropium (ATROVENT) 0.02 % nebulizer solution USE 2.5 ML(0.5 MG) VIA NEBULIZER THREE TIMES DAILY AS NEEDED FOR WHEEZING OR SHORTNESS OF BREATH (Patient taking differently: Take 0.5 mg by nebulization 3 (three) times daily as needed for wheezing or shortness of breath.) 1650 mL 1  . ISENTRESS 400 MG tablet TAKE 2 TABLETS(800 MG) BY MOUTH AT BEDTIME 60 tablet 5  . LYRICA 75 MG capsule Take 75 mg by mouth 2 (two) times daily.    . metoprolol succinate (TOPROL-XL) 100 MG 24 hr tablet Take  1 tablet (100 mg total) by mouth daily. 90 tablet 3  . mometasone-formoterol (DULERA) 200-5 MCG/ACT AERO INHALE 2 PUFFS BY MOUTH EVERY MORNING AND 2 PUFFS 12 HOURS LATER 13 g 0  . Multiple Vitamin (MULTIVITAMIN WITH MINERALS) TABS tablet Take 1 tablet by mouth daily after breakfast.     . mupirocin ointment (BACTROBAN) 2 % APPLY EXTERNALLY TO THE AFFECTED AREA TWICE DAILY FOR 3 WEEKS, PLACE COTTON BETWEEN TOES TO ALLOW BETTER AERATION AS NEEDED 66 g 1  . omeprazole (PRILOSEC) 40 MG capsule 1 capsule 30 minutes before morning meal    . OVER THE COUNTER MEDICATION Take 2 capsules by mouth daily. Zhou: thyroid supplement    . Polyethyl Glycol-Propyl Glycol (SYSTANE) 0.4-0.3 % GEL ophthalmic gel Place 1 application into both eyes 2 (two) times daily.     . SUMAtriptan (IMITREX) 100 MG tablet TAKE 1 TABLET BY MOUTH EVERY 2 HOURS AS NEEDED FOR MIGRAINE, MAY REPEAT IN 2 HOURS IF HEADACHE PERSISTS OR RECURS 10 tablet 2  . tizanidine (ZANAFLEX) 6 MG capsule Take 1 capsule (6 mg total) by mouth 3 (three) times daily. 90 capsule 11  . torsemide (DEMADEX) 20 MG tablet Take 20 mg by mouth daily as needed.    . TRUVADA 200-300 MG  tablet Take 1 tablet by mouth daily. 30 tablet 5  . azithromycin (ZITHROMAX) 250 MG tablet Take 2 tablets today, then 1 daily until gone. 6 tablet 0  . predniSONE (DELTASONE) 10 MG tablet 2 tabs daily X5 days, then 1 tab daily X5 days. 15 tablet 0   No facility-administered medications prior to visit.   Review of Systems  Review of Systems  Respiratory: Positive for cough and wheezing.    Physical Exam  BP 130/80 (BP Location: Right Arm, Cuff Size: Large)   Pulse 74   Temp 98 F (36.7 C) (Temporal)   Ht 5\' 9"  (1.753 m)   Wt 233 lb 12.8 oz (106.1 kg)   SpO2 96%   BMI 34.53 kg/m  Physical Exam Constitutional:      Appearance: Normal appearance.     Comments: Strong cologne  HENT:     Head: Normocephalic and atraumatic.  Cardiovascular:     Rate and Rhythm: Normal rate and regular rhythm.     Comments: No edema Pulmonary:     Effort: Pulmonary effort is normal.     Breath sounds: Wheezing present.  Skin:    General: Skin is warm and dry.  Neurological:     Mental Status: He is alert.  Psychiatric:        Mood and Affect: Mood normal.        Behavior: Behavior normal.        Thought Content: Thought content normal.        Judgment: Judgment normal.     Lab Results:  CBC    Component Value Date/Time   WBC 4.3 03/11/2020 1540   RBC 4.50 03/11/2020 1540   HGB 14.5 03/11/2020 1540   HGB 14.0 05/11/2015 1108   HCT 43.2 03/11/2020 1540   HCT 42.1 05/11/2015 1108   PLT 169 03/11/2020 1540   PLT 126 (L) 05/11/2015 1108   MCV 96.0 03/11/2020 1540   MCV 97.5 05/11/2015 1108   MCH 32.2 03/11/2020 1540   MCHC 33.6 03/11/2020 1540   RDW 14.0 03/11/2020 1540   RDW 12.9 05/11/2015 1108   LYMPHSABS 1,990 12/04/2018 1543   LYMPHSABS 1.5 05/11/2015 1108   MONOABS 0.6 04/26/2017 2053  MONOABS 0.3 05/11/2015 1108   EOSABS 322 12/04/2018 1543   EOSABS 0.6 (H) 05/11/2015 1108   BASOSABS 47 12/04/2018 1543   BASOSABS 0.0 05/11/2015 1108    BMET    Component Value  Date/Time   NA 141 03/11/2020 1540   NA 142 02/24/2013 0848   K 3.9 03/11/2020 1540   K 3.4 (L) 02/24/2013 0848   CL 107 03/11/2020 1540   CO2 26 03/11/2020 1540   CO2 24 02/24/2013 0848   GLUCOSE 89 03/11/2020 1540   GLUCOSE 121 02/24/2013 0848   GLUCOSE 93 03/25/2009 0000   BUN 10 03/11/2020 1540   BUN 11.9 02/24/2013 0848   CREATININE 1.19 03/11/2020 1540   CREATININE 1.3 02/24/2013 0848   CALCIUM 9.0 03/11/2020 1540   CALCIUM 8.9 02/24/2013 0848   GFRNONAA 58 (L) 05/08/2019 0504   GFRNONAA 70 12/04/2018 1543   GFRAA >60 05/08/2019 0504   GFRAA 82 12/04/2018 1543    BNP    Component Value Date/Time   BNP 7.2 11/11/2014 1130    ProBNP No results found for: PROBNP  Imaging: No results found.   Assessment & Plan:   COPD GOLD III Patient has had a lingering cough with associated wheezing since pneumonia in November 2021. Last treated for exacerbation 2 weeks ago with Biaxin and prednisone. He is compliant with Dulera 200 two puffs twice daily. Recommend adding Spiriva Respimat (LAMA). Repeat CXR today. Recommend he avoid heavy/strong smelling perfumes. FU in 2-3 months with Dr. Melvyn Novas or sooner if no improvement with above plan.   Recommendations: Continue Dulera two puffs twice a day (use with spacer) Adding Spiriva respimat take two puffs once daily in the morning   Orders: CXR today  Follow-up Dr. Melvyn Novas in 2-3 months    Martyn Ehrich, NP 08/16/2020

## 2020-08-16 NOTE — Assessment & Plan Note (Signed)
Patient has had a lingering cough with associated wheezing since pneumonia in November 2021. Last treated for exacerbation 2 weeks ago with Biaxin and prednisone. He is compliant with Dulera 200 two puffs twice daily. Recommend adding Spiriva Respimat (LAMA). Repeat CXR today. Recommend he avoid heavy/strong smelling perfumes. FU in 2-3 months with Dr. Melvyn Novas or sooner if no improvement with above plan.   Recommendations: Continue Dulera two puffs twice a day (use with spacer) Adding Spiriva respimat take two puffs once daily in the morning   Orders: CXR today  Follow-up Dr. Melvyn Novas in 2-3 months

## 2020-08-18 ENCOUNTER — Other Ambulatory Visit: Payer: Self-pay | Admitting: *Deleted

## 2020-08-18 ENCOUNTER — Telehealth: Payer: Self-pay | Admitting: Pulmonary Disease

## 2020-08-18 DIAGNOSIS — M542 Cervicalgia: Secondary | ICD-10-CM | POA: Diagnosis not present

## 2020-08-18 DIAGNOSIS — J449 Chronic obstructive pulmonary disease, unspecified: Secondary | ICD-10-CM

## 2020-08-18 MED ORDER — HYDROCOD POLST-CPM POLST ER 10-8 MG/5ML PO SUER
5.0000 mL | Freq: Two times a day (BID) | ORAL | 0 refills | Status: DC
Start: 2020-08-18 — End: 2020-09-28

## 2020-08-18 NOTE — Telephone Encounter (Signed)
Noted.   Will route message to [facility].   Beth please advise if willing to fill prescription for tussionex. Thanks :)

## 2020-08-18 NOTE — Telephone Encounter (Signed)
Please send to APP Volanda Napoleon who evaluated patient

## 2020-08-18 NOTE — Telephone Encounter (Addendum)
Sent in RX. He can take twice a day as needed for cough. Do not take with Atarax or other sedation medications. Please also verify that he can take hydrocodone, he has an allergy listed to morphine products. Use sparingly, no refills.

## 2020-08-18 NOTE — Telephone Encounter (Signed)
-----   Message from Lorretta Harp, [place] sent at 08/18/2020  9:22 AM EST ----- Called and spoke with pt's sister April letting her know the results of pt's cxr and asked if pt still had zpak.April stated that pt did still have zpak. Stated to her for pt to take that and she verbalized understanding. Stated to her that we would repeat cxr at next OV with RA.  While speaking with pt, she wanted to know if pt could have Tussionex prescribed. Dr. Elsworth Soho, please advise if you would be okay sending Rx to pharmacy for pt.

## 2020-08-18 NOTE — Telephone Encounter (Signed)
Spoke with the pt and his sister, April and notified of response per [facility] He can tolerate hydrocodone, has taken this before  Nothing further needed

## 2020-08-18 NOTE — Progress Notes (Signed)
Please let patient knwo CXR showed patchy opacity right mid lung, suspicious of pneumonia. If he still has the azithromycin Dr. Elsworth Soho sent in previous would take that. If not we can send in zpack. Needs FU CXR at next visit.

## 2020-08-30 ENCOUNTER — Ambulatory Visit: Payer: Medicare Other | Admitting: Pulmonary Disease

## 2020-08-31 ENCOUNTER — Telehealth: Payer: Self-pay

## 2020-08-31 DIAGNOSIS — G959 Disease of spinal cord, unspecified: Secondary | ICD-10-CM | POA: Diagnosis not present

## 2020-08-31 NOTE — Telephone Encounter (Signed)
PA initiated for patient's Truvada. Awaiting response.   Coner Gibbard Lorita Officer, RN

## 2020-09-01 NOTE — Telephone Encounter (Signed)
RN faxed additional information to [facility] for patient's Truvada PA.   Beryle Flock, RN

## 2020-09-02 ENCOUNTER — Other Ambulatory Visit: Payer: Self-pay | Admitting: Orthopedic Surgery

## 2020-09-02 NOTE — Telephone Encounter (Signed)
PA approved for Truvada, non-formulary exception through 07/02/21. RN left message notifying [facility] at [address].

## 2020-09-09 ENCOUNTER — Other Ambulatory Visit: Payer: Self-pay | Admitting: Neurology

## 2020-09-09 ENCOUNTER — Ambulatory Visit: Payer: Medicare Other | Admitting: Pulmonary Disease

## 2020-09-09 DIAGNOSIS — G4700 Insomnia, unspecified: Secondary | ICD-10-CM

## 2020-09-16 ENCOUNTER — Ambulatory Visit: Payer: Medicare Other | Admitting: Infectious Diseases

## 2020-09-16 ENCOUNTER — Telehealth: Payer: Self-pay

## 2020-09-16 NOTE — Telephone Encounter (Signed)
Called patient to confirm appointment today. Patient states he was unaware of appointment and will have his sister call to reschedule. Patient due for labs as well. Unable to offer phone visit at this time. Eugenia Mcalpine

## 2020-09-24 NOTE — Pre-Procedure Instructions (Signed)
Surgical Instructions:    Your procedure is scheduled on Wednesday 09/29/20 (07:30 AM- 11:00 AM).  Report to [facility] Main Entrance "A" at 05:30 A.M., then check in with the Admitting office.  Call this number if you have problems the morning of surgery:  [phone]   If you have any questions prior to your surgery date call [phone]: Open Monday-Friday 8am-4pm.    Remember:  Do not eat after midnight the night before your surgery.  You may drink clear liquids until 04:30 AM the morning of your surgery.   Clear liquids allowed are: Water, Non-Citrus Juices (without pulp), Carbonated Beverages, Clear Tea, Black Coffee Only, and Gatorade.  . The day of surgery (if you do NOT have diabetes):  o Drink ONE (1) Pre-Surgery Clear Ensure by 04:30 AM the morning of surgery   o This drink was given to you during your hospital pre-op appointment visit. o Nothing else to drink after completing the Pre-Surgery Clear Ensure.         If you have questions, please contact your surgeon's office.     Take these medicines the morning of surgery with A SIP OF WATER: cetirizine (ZYRTEC)  LYRICA metoprolol succinate (TOPROL-XL) omeprazole (PRILOSEC) TRUVADA Beclomethasone Dipropionate (QNASL) inhaler mometasone-formoterol (DULERA) inhaler Tiotropium Bromide Monohydrate (SPIRIVA RESPIMAT) inhaler  IF NEEDED: cyclobenzaprine (FLEXERIL)  gabapentin (NEURONTIN) HYDROcodone-acetaminophen (NORCO/VICODIN) chlorpheniramine-HYDROcodone (TUSSIONEX PENNKINETIC ER)  methocarbamol (ROBAXIN) SUMAtriptan (IMITREX) tizanidine (ZANAFLEX) Polyethyl Glycol-Propyl Glycol (SYSTANE) eye gel ipratropium (ATROVENT) nebulizer breathing treatment albuterol (VENTOLIN HFA) inhaler- Please bring with you the day of surgery   As of today, STOP taking any Aspirin (unless otherwise instructed by your surgeon), NSAIDs such as diclofenac Sodium (VOLTAREN) GEL, Aleve, Naproxen, Ibuprofen, Motrin, Advil, Goody's,  BC's, all herbal medications, fish oil, and all vitamins.           The Morning of Surgery:            Do not wear jewelry.            Do not wear lotions, powders, colognes, or deodorant.            Men may shave face and neck.            Do not bring valuables to the hospital.            [facility] is not responsible for any belongings or valuables.  Do NOT Smoke (Tobacco/Vaping) or drink Alcohol 24 hours prior to your procedure.  If you use a CPAP at night, you may bring all equipment for your overnight stay.   Contacts, glasses, dentures or bridgework may not be worn into surgery, please bring cases for these belongings   For patients admitted to the hospital, discharge time will be determined by your treatment team.   Patients discharged the day of surgery will not be allowed to drive home, and someone needs to stay with them for 24 hours.    Special instructions:   - Preparing For Surgery  Before surgery, you can play an important role. Because skin is not sterile, your skin needs to be as free of germs as possible. You can reduce the number of germs on your skin by washing with CHG (chlorahexidine gluconate) Soap before surgery.  CHG is an antiseptic cleaner which kills germs and bonds with the skin to continue killing germs even after washing.    Oral Hygiene is also important to reduce your risk of infection.  Remember - BRUSH YOUR TEETH THE MORNING  OF SURGERY WITH YOUR REGULAR TOOTHPASTE  Please do not use if you have an allergy to CHG or antibacterial soaps. If your skin becomes reddened/irritated stop using the CHG.  Do not shave (including legs and underarms) for at least 48 hours prior to first CHG shower. It is OK to shave your face.  Please follow these instructions carefully.   1. Shower the NIGHT BEFORE SURGERY and the MORNING OF SURGERY.  2. If you chose to wash your hair, wash your hair first as usual with your normal shampoo.  3. After you  shampoo, rinse your hair and body thoroughly to remove the shampoo.  4. Wash Face and genitals (private parts) with your normal soap.   5.  Shower the NIGHT BEFORE SURGERY and the MORNING OF SURGERY with CHG Soap.   6. Use CHG Soap as you would any other liquid soap. You can apply CHG directly to the skin and wash gently with a scrungie or a clean washcloth.   7. Apply the CHG Soap to your body ONLY FROM THE NECK DOWN.  Do not use on open wounds or open sores. Avoid contact with your eyes, ears, mouth and genitals (private parts). Wash Face and genitals (private parts)  with your normal soap.   8. Wash thoroughly, paying special attention to the area where your surgery will be performed.  9. Thoroughly rinse your body with warm water from the neck down.  10. DO NOT shower/wash with your normal soap after using and rinsing off the CHG Soap.  11. Pat yourself dry with a CLEAN TOWEL.  12. Wear CLEAN PAJAMAS to bed the night before surgery.  4. Place CLEAN SHEETS on your bed the night before your surgery.  14. DO NOT SLEEP WITH PETS.   Day of Surgery: SHOWER WITH CHG SOAP. Wear Clean/Comfortable clothing the morning of surgery Do not apply any deodorants/lotions.   Remember to brush your teeth WITH YOUR REGULAR TOOTHPASTE.   Please read over the following fact sheets that you were given.

## 2020-09-27 ENCOUNTER — Encounter (HOSPITAL_COMMUNITY)
Admission: RE | Admit: 2020-09-27 | Discharge: 2020-09-27 | Disposition: A | Discharge status: Home / Self Care | Payer: Medicare Other | Source: Ambulatory Visit | Attending: Orthopedic Surgery | Admitting: Orthopedic Surgery

## 2020-09-27 ENCOUNTER — Other Ambulatory Visit: Payer: Self-pay

## 2020-09-27 ENCOUNTER — Encounter (HOSPITAL_COMMUNITY): Payer: Self-pay

## 2020-09-27 DIAGNOSIS — Z01812 Encounter for preprocedural laboratory examination: Secondary | ICD-10-CM | POA: Insufficient documentation

## 2020-09-27 DIAGNOSIS — Z20822 Contact with and (suspected) exposure to covid-19: Secondary | ICD-10-CM | POA: Diagnosis not present

## 2020-09-27 DIAGNOSIS — G959 Disease of spinal cord, unspecified: Secondary | ICD-10-CM | POA: Diagnosis not present

## 2020-09-27 LAB — CBC WITH DIFFERENTIAL/PLATELET
Abs Immature Granulocytes: 0.01 10*3/uL (ref 0.00–0.07)
Basophils Absolute: 0 10*3/uL (ref 0.0–0.1)
Basophils Relative: 1 %
Eosinophils Absolute: 0.9 10*3/uL — ABNORMAL HIGH (ref 0.0–0.5)
Eosinophils Relative: 19 %
HCT: 41.8 % (ref 39.0–52.0)
Hemoglobin: 13.7 g/dL (ref 13.0–17.0)
Immature Granulocytes: 0 %
Lymphocytes Relative: 41 %
Lymphs Abs: 2.1 10*3/uL (ref 0.7–4.0)
MCH: 31.4 pg (ref 26.0–34.0)
MCHC: 32.8 g/dL (ref 30.0–36.0)
MCV: 95.7 fL (ref 80.0–100.0)
Monocytes Absolute: 0.4 10*3/uL (ref 0.1–1.0)
Monocytes Relative: 8 %
Neutro Abs: 1.5 10*3/uL — ABNORMAL LOW (ref 1.7–7.7)
Neutrophils Relative %: 31 %
Platelets: 162 10*3/uL (ref 150–400)
RBC: 4.37 MIL/uL (ref 4.22–5.81)
RDW: 12.8 % (ref 11.5–15.5)
WBC: 4.9 10*3/uL (ref 4.0–10.5)
nRBC: 0 % (ref 0.0–0.2)

## 2020-09-27 LAB — COMPREHENSIVE METABOLIC PANEL
ALT: 20 U/L (ref 0–44)
AST: 22 U/L (ref 15–41)
Albumin: 3.8 g/dL (ref 3.5–5.0)
Alkaline Phosphatase: 94 U/L (ref 38–126)
Anion gap: 7 (ref 5–15)
BUN: 13 mg/dL (ref 6–20)
CO2: 24 mmol/L (ref 22–32)
Calcium: 9.1 mg/dL (ref 8.9–10.3)
Chloride: 108 mmol/L (ref 98–111)
Creatinine, Ser: 1.47 mg/dL — ABNORMAL HIGH (ref 0.61–1.24)
GFR, Estimated: 55 mL/min — ABNORMAL LOW (ref >60)
Glucose, Bld: 95 mg/dL (ref 70–99)
Potassium: 3.9 mmol/L (ref 3.5–5.1)
Sodium: 139 mmol/L (ref 135–145)
Total Bilirubin: 0.7 mg/dL (ref 0.3–1.2)
Total Protein: 6.7 g/dL (ref 6.5–8.1)

## 2020-09-27 LAB — URINALYSIS, ROUTINE W REFLEX MICROSCOPIC
Bilirubin Urine: NEGATIVE
Glucose, UA: NEGATIVE mg/dL
Hgb urine dipstick: NEGATIVE
Ketones, ur: NEGATIVE mg/dL
Leukocytes,Ua: NEGATIVE
Nitrite: NEGATIVE
Protein, ur: 100 mg/dL — AB
Specific Gravity, Urine: 1.014 (ref 1.005–1.030)
pH: 5 (ref 5.0–8.0)

## 2020-09-27 LAB — PROTIME-INR
INR: 1 (ref 0.8–1.2)
Prothrombin Time: 13 seconds (ref 11.4–15.2)

## 2020-09-27 LAB — TYPE AND SCREEN
ABO/RH(D): B NEG
Antibody Screen: NEGATIVE

## 2020-09-27 LAB — APTT: aPTT: 29 seconds (ref 24–36)

## 2020-09-27 LAB — SURGICAL PCR SCREEN
MRSA, PCR: NEGATIVE
Staphylococcus aureus: NEGATIVE

## 2020-09-27 NOTE — Progress Notes (Addendum)
PCP - Dr. Cathlean Cower Cardiologist - Dr. Virgina Jock Pulm-Dr. Elsworth Soho  Chest x-ray - 08/16/20 EKG - 05/03/20 Stress Test - 2017 ECHO - 05/06/20 Cardiac Cath - 2010  Sleep Study - denies  CPAP - denies  Blood Thinner Instructions: n/a Aspirin Instructions: n/a  ERAS Protcol -pt to stop clear liquids by 0430 DOS PRE-SURGERY Ensure or G2- pt given (1) pre-surgical ensure.   COVID TEST- 09/27/20; pt aware of quarantine guidelines prior to surgery.  Dr. Laurena Bering office aware of the Vancomycin order and pt's allergy.   Anesthesia review: yes  Patient denies shortness of breath, fever, cough and chest pain at PAT appointment   All instructions explained to the patient, with a verbal understanding of the material. Patient agrees to go over the instructions while at home for a better understanding. Patient also instructed to self quarantine after being tested for COVID-19. The opportunity to ask questions was provided.

## 2020-09-28 ENCOUNTER — Ambulatory Visit (INDEPENDENT_AMBULATORY_CARE_PROVIDER_SITE_OTHER): Payer: Medicare Other

## 2020-09-28 ENCOUNTER — Ambulatory Visit (INDEPENDENT_AMBULATORY_CARE_PROVIDER_SITE_OTHER): Payer: Medicare Other | Admitting: Pulmonary Disease

## 2020-09-28 ENCOUNTER — Encounter: Payer: Self-pay | Admitting: Pulmonary Disease

## 2020-09-28 VITALS — BP 144/84 | HR 63 | Temp 98.3°F | Ht 69.0 in | Wt 236.0 lb

## 2020-09-28 DIAGNOSIS — J449 Chronic obstructive pulmonary disease, unspecified: Secondary | ICD-10-CM

## 2020-09-28 DIAGNOSIS — J13 Pneumonia due to Streptococcus pneumoniae: Secondary | ICD-10-CM | POA: Diagnosis not present

## 2020-09-28 LAB — SARS CORONAVIRUS 2 (TAT 6-24 HRS): SARS Coronavirus 2: NEGATIVE

## 2020-09-28 MED ORDER — BREZTRI AEROSPHERE 160-9-4.8 MCG/ACT IN AERO: 2.0000 | INHALATION_SPRAY | Freq: Two times a day (BID) | RESPIRATORY_TRACT | 0 refills | Status: DC

## 2020-09-28 MED ORDER — ALBUTEROL SULFATE (2.5 MG/3ML) 0.083% IN NEBU: 2.5000 mg | INHALATION_SOLUTION | Freq: Four times a day (QID) | RESPIRATORY_TRACT | 3 refills | Status: DC | PRN

## 2020-09-28 MED ORDER — VANCOMYCIN HCL 1500 MG/300ML IV SOLN
1500.0000 mg | Freq: Once | INTRAVENOUS | Status: DC
  Filled 2020-09-28: qty 300

## 2020-09-28 NOTE — Progress Notes (Signed)
Subjective:    Patient ID: Casey Brennan, male    DOB: 07-19-1960, 60 y.o.   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 Asthma vs copd  PMH -  chronic sinusitis, hypertension, GERD, chronic renal insufficiency.   HIV -last CD 4 479 03/2020  He is a patient of Casey. Melvyn Brennan but would now like to establish with me.  He arrives accompanied by his sister, Casey Brennan who is a former patient of mine I also note hypersomnolence listed on the problem list but patient and her sister deny this problem, PCP noted that I should evaluate him for this but again he does not want to be evaluated for this and does not want sleep study since he denies this symptom.  He is due for C-spine surgery tomorrow by Casey. Lynann Brennan and he needs surgical clearance.  In November 2021 for right lower lobe infiltrate.  He was reassessed 08/2020 for lingering symptoms of cough and wheezing.  He went to urgent care in mid January and was given a course of prednisone and Biaxin.  He was given treatment for Tussionex in January, refill February and this seems to have helped.  His cough is much improved.  I have reviewed NP evaluation from 08/16/20   Chief Complaint  Patient presents with  . New Patient (Initial Visit)    Some Sob and wheezing. Surgery clearance   He has been maintained on a regimen of Dulera for many years.  He uses albuterol on an as-needed basis.  Sister requests that he needs a new nebulizer.  He has Atrovent nebs to go in the nebulizer which he uses once daily. On his last office visit 08/2020 he was given Spiriva but he does not feel that this is helping anymore  He smoked more than 30 pack years before he quit in 2012.  Labs 3/28 were reviewed, creatinine slightly increased to 1.5, no leukocytosis or anemia , significant eosinophilia with absolute eosinophil count 900  Significant tests/ events  reviewed CT cors 04/2020 ca score 0  spirometry 07/22/2014  FEV1  1.12 (34%) ratio 59  AEC 900   Past Medical History:  Diagnosis Date  . Arthritis    r knee   . Asthma    very rare  . Axillary lymphadenopathy   . Bronchitis   . Chronic anemia    normocytic  . Chronic folliculitis   . Elevated PSA   . Environmental and seasonal allergies   . GERD (gastroesophageal reflux disease)    Patient denies  . Gross hematuria   . H/O pericarditis    2010--  myopercarditis--  resolved  . HCAP (healthcare-associated pneumonia) 11/19/2014  . Headache(784.0)    HX SEVERE FRONTAL Brennan'S  . Hiatal hernia   . History of concussion    2012  &  2013  RESIDUAL Brennan'S --  RESOLVED  . History of gastric ulcer   . History of kidney stones   . History of MRSA infection 2010   infected boil  . HIV (human immunodeficiency virus infection) ([place]) 1988  . Hypertension   . Internal hemorrhoids   . Lytic bone lesion of hip    WORK-UP DONE BY ONCOLOGIST Casey Brennan --  NOT MALIGNANT  . 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 Pneumonia    hx of  . Post concussion syndrome  resolved  . Prostate cancer ([place]) 04/25/13   gleason 3+3=6, volume 30 gm  . Sinusitis, chronic 05/14/2015  . Ulcer    hx of gastric  . Wears glasses     Past Surgical History:  Procedure Laterality Date  . CARDIAC CATHETERIZATION  03-23-2009  Casey Casey Brennan   NORMAL CORONARY ARTERIES  . CHOLECYSTECTOMY N/A 11/06/2014   Procedure: LAPAROSCOPIC CHOLECYSTECTOMY WITH INTRAOPERATIVE CHOLANGIOGRAM;  Surgeon: Casey Horn, MD;  Location: [facility];  Service: General;  Laterality: N/A;  . COLONOSCOPY  12/26/2011   Procedure: COLONOSCOPY;  Surgeon: Casey Ng, MD;  Location: [facility];  Service: Endoscopy;  Laterality: N/A;  . COLONOSCOPY    . COLONOSCOPY WITH PROPOFOL N/A 07/29/2014   Procedure: COLONOSCOPY WITH PROPOFOL;  Surgeon: Casey Ng, MD;  Location: [facility];  Service: Endoscopy;  Laterality: N/A;  . CYSTOSCOPY N/A  05/18/2018   Procedure: CYSTOSCOPY REMOVAL FOREIGN BODY;  Surgeon: Casey Rhodes, MD;  Location: [facility];  Service: Urology;  Laterality: N/A;  . CYSTOSCOPY/RETROGRADE/URETEROSCOPY Bilateral 04/25/2013   Procedure: CYSTOSCOPY/ BILATERAL RETROGRADES; BLADDER BIOPSIES;  Surgeon: Casey Frock, MD;  Location: [facility];  Service: Urology;  Laterality: Bilateral;  . DENTAL EXAMINATION UNDER ANESTHESIA    . ESOPHAGOGASTRODUODENOSCOPY  12/26/2011   Procedure: ESOPHAGOGASTRODUODENOSCOPY (EGD);  Surgeon: Casey Ng, MD;  Location: [facility];  Service: Endoscopy;  Laterality: N/A;  . ESOPHAGOGASTRODUODENOSCOPY (EGD) WITH PROPOFOL N/A 07/29/2014   Procedure: ESOPHAGOGASTRODUODENOSCOPY (EGD) WITH PROPOFOL;  Surgeon: Casey Ng, MD;  Location: [facility];  Service: Endoscopy;  Laterality: N/A;  . EXCISION CHRONIC LEFT BREAST ABSCESS  09-21-2010  . EXCISIONAL BX LEFT BREAST MASS/  I  &  D LEFT BREAST ABSCESS  03-24-2009  . KNEE ARTHROSCOPY Right 1985  . left axilla biopsy  04/2013  . left hip biopsy  10/2012  . LYMPHADENECTOMY Bilateral 08/18/2013   Procedure: LYMPHADENECTOMY "PELVIC LYMPH NODE DISSECTION";  Surgeon: Casey Frock, MD;  Location: [facility];  Service: Urology;  Laterality: Bilateral;  . PROSTATE BIOPSY N/A 04/25/2013   Procedure: BIOPSY TRANSRECTAL ULTRASONIC PROSTATE (TUBP);  Surgeon: Casey Frock, MD;  Location[facility];  Service: Urology;  Laterality: N/A;  . ROBOT ASSISTED LAPAROSCOPIC RADICAL PROSTATECTOMY N/A 08/18/2013   Procedure: ROBOTIC ASSISTED LAPAROSCOPIC RADICAL PROSTATECTOMY;  Surgeon: Casey Frock, MD;  Location: [facility];  Service: Urology;  Laterality: N/A;  . TOTAL KNEE ARTHROPLASTY Right 08/01/2016   08/17/16  . TOTAL KNEE ARTHROPLASTY Right 08/01/2016   Procedure: TOTAL KNEE ARTHROPLASTY;  Surgeon: Casey Butters, MD;  Location: [facility];  Service: Orthopedics;  Laterality: Right;  . UPPER GASTROINTESTINAL ENDOSCOPY     . UPPER LEG SOFT TISSUE BIOPSY Left 2012   lthigh     Allergies  Allergen Reactions  . Chocolate Shortness Of Breath and Other (See Comments)    Asthma attack, welts  . Descovy [Emtricitabine-Tenofovir Af] Shortness Of Breath    Nasuea, vomiiting, diarrhea, sob, whelps, throat closes  . Genvoya [Elviteg-Cobic-Emtricit-Tenofaf] Shortness Of Breath    Nausea, vomitting, diarreha, sob, rash, throat closing  . Morphine And Related Shortness Of Breath, Itching and Other (See Comments)     chest pain  . Penicillins Shortness Of Breath and Rash    Has patient had a PCN reaction causing immediate rash, facial/tongue/throat swelling, SOB or lightheadedness with hypotension: Yes Has patient had a PCN reaction causing severe rash involving mucus membranes or skin necrosis: No Has patient had a PCN reaction that required hospitalization No Has patient had a  PCN reaction occurring within the last 10 years: Yes If all of the above answers are "NO", then may proceed with Cephalosporin use.  . Sulfa Antibiotics Anaphylaxis  . Tomato Shortness Of Breath  . Vancomycin Shortness Of Breath  . Carvedilol     LEG SWELLING, DIZZNESS  . Flagyl [Metronidazole] Itching  . Milk-Related Compounds   . Toradol [Ketorolac Tromethamine] Nausea And Vomiting    itching  . Tramadol Itching and Nausea And Vomiting    Social History   Socioeconomic History  . Marital status: Divorced    Spouse name: Not on file  . Number of children: 1  . Years of education: College  . Highest education level: Not on file  Occupational History  . 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 Occupation: disability  Tobacco Use  . Smoking status: Former Smoker    Packs/day: 1.50    Years: 23.00    Pack years: 34.50    Types: Cigars, Cigarettes    Quit date: 05/22/2010    Years since quitting: 10.3  . Smokeless tobacco: Never Used  Vaping Use  . Vaping Use: Never used  Substance and  Sexual Activity  . Alcohol use: Not Currently    Alcohol/week: 0.0 standard drinks    Comment: 1-2 drinks a week   . Drug use: No  . Sexual activity: Not Currently    Birth control/protection: Condom    Comment: pt. declined condoms 03/11/20  Other Topics Concern  . Not on file  Social History Narrative   Sister Casey stays with him currently although he hopes to return to truck driving. 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 Sister is starting medical school next year.   Patient is divorced and lives with his sister.   Patient has one adult son.   Patient is part-time, driving a truck.   Patient has a college education.   Patient is right-handed.   Patient drinks 2-3 cups of coffee and a 4-5 cups of tea.   Social Determinants of Health   Financial Resource Strain: Not on file  Food Insecurity: Not on file  Transportation Needs: Not on file  Physical Activity: Not on file  Stress: Not on file  Social Connections: Not on file  Intimate Partner Violence: Not on file    Family History  Problem Relation Age of Onset  . Stroke Father   . Diabetes Father   . Cancer Father        brain cancer  . Asthma Father   . Hypertension Sister   . Cancer Maternal Uncle        prostate cancer  . Asthma Sister   . Colon cancer Neg Hx   . Colon polyps Neg Hx   . Esophageal cancer Neg Hx   . Stomach cancer Neg Hx   . Rectal cancer Neg Hx       Review of Systems Constitutional: negative for anorexia, fevers and sweats  Eyes: negative for irritation, redness and visual disturbance  Ears, nose, mouth, throat, and face: negative for earaches, epistaxis, nasal congestion and sore throat  Respiratory: negative for  sputum and wheezing  Cardiovascular: negative for chest pain, dyspnea, lower extremity edema, orthopnea, palpitations and syncope  Gastrointestinal: negative for abdominal pain, constipation, diarrhea, melena, nausea and vomiting  Genitourinary:negative for dysuria,  frequency and hematuria  Hematologic/lymphatic: negative for bleeding, easy bruising and lymphadenopathy  Musculoskeletal:negative for arthralgias, muscle weakness and stiff joints  Neurological: negative for coordination problems, gait problems, headaches and weakness  Endocrine: negative for diabetic symptoms including polydipsia, polyuria and weight loss     Objective:   Physical Exam  Gen. Pleasant, well-nourished, in no distress, normal affect ENT - no pallor,icterus, no post nasal drip Neck: No JVD, no thyromegaly, no carotid bruits Lungs: no use of accessory muscles, no dullness to percussion, decreased BL  without rales or rhonchi  Cardiovascular: Rhythm regular, heart sounds  normal, no murmurs or gallops, 1+ peripheral edema Abdomen: soft and non-tender, no hepatosplenomegaly, BS normal. Musculoskeletal: No deformities, no cyanosis or clubbing Neuro:  alert, non focal       Assessment & Plan:

## 2020-09-28 NOTE — Assessment & Plan Note (Signed)
Chest x-ray independently reviewed which shows right middle lobe/lower lobe infiltrate is almost resolved.

## 2020-09-28 NOTE — Patient Instructions (Signed)
CXR today Schedule pFTs next visit  Sample of BREZTRI - 2 puffs twice daily to use instead of Dulera  Rx for new nebuliser + albuterol nebs to be sent to [facility]

## 2020-09-28 NOTE — Progress Notes (Signed)
Anesthesia Chart Review:  Follows with cardiologist Dr. Virgina Jock for risk factor management, HTN, HLD, hypertensive cardiomyopathy. Coronary CT October 2021 showed calcium score of 0, normal coronary arteries.  Echo November 2021 showed EF 37%, no significant valvular abnormalities. Last seen by Dr. Virgina Jock 08/12/2020, no changes to management, advised to follow-up in 3 months.  Hx of HIV, followed by ID.   Follows with pulmonology for hx of COPD GOLD III, maintained on Dulera, Spiriva, and as needed albuterol.   History of CKD 2.  Not in chart states he has hx of difficult intubation. Previous anesthesia notes reviewed, no difficult intubations noted.  Notably, intubation record from laparoscopic cholecystectomy on 11/06/2014 states patient was intubated with MAC and 4 with a grade 3 view, 1 attempt.  Preop labs reviewed, creatinine mildly elevated at 1.47 consistent with his history, otherwise unremarkable.  EKG 05/03/2020: Tachycardia.  Rate 100.  LAE.  TTE 05/06/2020: Echocardiogram 05/06/2020:  Moderately depressed LV systolic function with EF 37%. Left ventricle  cavity is normal in size. Moderate concentric hypertrophy of the left  ventricle. Hypokinetic global wall motion. Doppler evidence of grade I  (impaired) diastolic dysfunction, normal LAP. Calculated EF 37%.  Structurally normal mitral valve. Mild (Grade I) mitral regurgitation.  Mild pulmonic regurgitation.  Coronary CTA 04/07/2020: IMPRESSION: 1. Coronary calcium score of 0.  2. Normal coronary origin with right dominance.  3. Normal coronary arteries.  RECOMMENDATIONS: 1. No evidence of CAD (0%). Consider non-atherosclerotic causes of chest pain.   [place], [facility] Short Stay Center/Anesthesiology Phone [phone] 09/28/2020 11:41 AM

## 2020-09-28 NOTE — Anesthesia Preprocedure Evaluation (Addendum)
Anesthesia Evaluation  Patient identified by MRN, date of birth, ID band Patient awake    Reviewed: Allergy & Precautions, NPO status , Patient's Chart, lab work & pertinent test results, reviewed documented beta blocker date and time   Airway Mallampati: II  TM Distance: >3 FB Neck ROM: Limited    Dental  (+) Teeth Intact, Dental Advisory Given, Caps   Pulmonary asthma , COPD,  COPD inhaler, former smoker,    Pulmonary exam normal breath sounds clear to auscultation       Cardiovascular hypertension, Pt. on home beta blockers Normal cardiovascular exam Rhythm:Regular Rate:Normal     Neuro/Psych  Headaches, PSYCHIATRIC DISORDERS Dementia NEUROFORAMINAL STENOSIS AND SPINAL CORD COMPRESSION SPANNING CERVICAL 3 - CERVICAL 6  Neuromuscular disease    GI/Hepatic Neg liver ROS, hiatal hernia, GERD  Medicated,  Endo/Other  Obesity   Renal/GU Renal InsufficiencyRenal disease   Prostate cancer     Musculoskeletal  (+) Arthritis ,   Abdominal   Peds  Hematology  (+) HIV,   Anesthesia Other Findings   Reproductive/Obstetrics                           Anesthesia Physical Anesthesia Plan  ASA: IV  Anesthesia Plan: General   Post-op Pain Management:    Induction: Intravenous  PONV Risk Score and Plan: 2 and Ondansetron, Treatment may vary due to age or medical condition and Midazolam  Airway Management Planned: Oral ETT and Video Laryngoscope Planned  Additional Equipment:   Intra-op Plan:   Post-operative Plan: Extubation in OR  Informed Consent: I have reviewed the patients History and Physical, chart, labs and discussed the procedure including the risks, benefits and alternatives for the proposed anesthesia with the patient or authorized representative who has indicated his/her understanding and acceptance.     Dental advisory given  Plan Discussed with:   Anesthesia Plan  Comments: (2nd PIV, Etomidate for induction.  PAT note by Karoline Caldwell, PA-C: Follows with cardiologist Dr. Virgina Jock for risk factor management, HTN, HLD, hypertensive cardiomyopathy. Coronary CT October 2021 showed calcium score of 0, normal coronary arteries.  Echo November 2021 showed EF 37%, no significant valvular abnormalities. Last seen by Dr. Virgina Jock 08/12/2020, no changes to management, advised to follow-up in 3 months.  Hx of HIV, followed by ID.   Follows with pulmonology for hx of COPD GOLD III, maintained on Dulera, Spiriva, and as needed albuterol.   History of CKD 2.  Not in chart states he has hx of difficult intubation. Previous anesthesia notes reviewed, no difficult intubations noted.  Notably, intubation record from laparoscopic cholecystectomy on 11/06/2014 states patient was intubated with MAC and 4 with a grade 3 view, 1 attempt.  Preop labs reviewed, creatinine mildly elevated at 1.47 consistent with his history, otherwise unremarkable.  EKG 05/03/2020: Tachycardia.  Rate 100.  LAE.  TTE 05/06/2020: Echocardiogram 05/06/2020:  Moderately depressed LV systolic function with EF 37%. Left ventricle  cavity is normal in size. Moderate concentric hypertrophy of the left  ventricle. Hypokinetic global wall motion. Doppler evidence of grade I  (impaired) diastolic dysfunction, normal LAP. Calculated EF 37%.  Structurally normal mitral valve. Mild (Grade I) mitral regurgitation.  Mild pulmonic regurgitation.  Coronary CTA 04/07/2020: IMPRESSION: 1. Coronary calcium score of 0.  2. Normal coronary origin with right dominance.  3. Normal coronary arteries.  RECOMMENDATIONS: 1. No evidence of CAD (0%). Consider non-atherosclerotic causes of chest pain. )     Anesthesia  Quick Evaluation

## 2020-09-28 NOTE — Assessment & Plan Note (Signed)
Based on his exam today, he is cleared for surgery with the risk. We will give him a sample of Breztri to substitute for [facility] and Spiriva.  He will call me back in 2 weeks to report if this is helping. He will be provided with a prescription for new nebulizer and albuterol nebs to use on an as-needed basis.  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

## 2020-09-29 ENCOUNTER — Ambulatory Visit (HOSPITAL_COMMUNITY): Payer: Medicare Other

## 2020-09-29 ENCOUNTER — Ambulatory Visit (HOSPITAL_COMMUNITY): Payer: Medicare Other | Admitting: Anesthesiology

## 2020-09-29 ENCOUNTER — Encounter (HOSPITAL_COMMUNITY): Payer: Self-pay | Admitting: Orthopedic Surgery

## 2020-09-29 ENCOUNTER — Other Ambulatory Visit: Payer: Self-pay

## 2020-09-29 ENCOUNTER — Ambulatory Visit (HOSPITAL_COMMUNITY)
Admission: RE | Disposition: A | Discharge status: Home / Self Care | Payer: Self-pay | Source: Home / Self Care | Attending: Orthopedic Surgery

## 2020-09-29 ENCOUNTER — Observation Stay (HOSPITAL_COMMUNITY)
Admission: RE | Admit: 2020-09-29 | Discharge: 2020-09-30 | Disposition: A | Discharge status: Home / Self Care | Payer: Medicare Other | Attending: Orthopedic Surgery | Admitting: Orthopedic Surgery

## 2020-09-29 ENCOUNTER — Ambulatory Visit (HOSPITAL_COMMUNITY): Payer: Medicare Other | Admitting: Vascular Surgery

## 2020-09-29 DIAGNOSIS — Z7951 Long term (current) use of inhaled steroids: Secondary | ICD-10-CM | POA: Insufficient documentation

## 2020-09-29 DIAGNOSIS — Z79899 Other long term (current) drug therapy: Secondary | ICD-10-CM | POA: Diagnosis not present

## 2020-09-29 DIAGNOSIS — G959 Disease of spinal cord, unspecified: Secondary | ICD-10-CM | POA: Diagnosis present

## 2020-09-29 DIAGNOSIS — J440 Chronic obstructive pulmonary disease with acute lower respiratory infection: Secondary | ICD-10-CM | POA: Diagnosis not present

## 2020-09-29 DIAGNOSIS — M4802 Spinal stenosis, cervical region: Secondary | ICD-10-CM | POA: Diagnosis not present

## 2020-09-29 DIAGNOSIS — M4712 Other spondylosis with myelopathy, cervical region: Principal | ICD-10-CM | POA: Insufficient documentation

## 2020-09-29 DIAGNOSIS — M79602 Pain in left arm: Secondary | ICD-10-CM | POA: Diagnosis present

## 2020-09-29 DIAGNOSIS — Z981 Arthrodesis status: Secondary | ICD-10-CM | POA: Diagnosis not present

## 2020-09-29 DIAGNOSIS — K219 Gastro-esophageal reflux disease without esophagitis: Secondary | ICD-10-CM | POA: Insufficient documentation

## 2020-09-29 DIAGNOSIS — J45909 Unspecified asthma, uncomplicated: Secondary | ICD-10-CM | POA: Insufficient documentation

## 2020-09-29 DIAGNOSIS — Z419 Encounter for procedure for purposes other than remedying health state, unspecified: Secondary | ICD-10-CM

## 2020-09-29 DIAGNOSIS — M4322 Fusion of spine, cervical region: Secondary | ICD-10-CM | POA: Diagnosis not present

## 2020-09-29 DIAGNOSIS — M5412 Radiculopathy, cervical region: Secondary | ICD-10-CM | POA: Diagnosis not present

## 2020-09-29 DIAGNOSIS — N182 Chronic kidney disease, stage 2 (mild): Secondary | ICD-10-CM | POA: Diagnosis not present

## 2020-09-29 DIAGNOSIS — Z87891 Personal history of nicotine dependence: Secondary | ICD-10-CM | POA: Insufficient documentation

## 2020-09-29 DIAGNOSIS — I129 Hypertensive chronic kidney disease with stage 1 through stage 4 chronic kidney disease, or unspecified chronic kidney disease: Secondary | ICD-10-CM | POA: Diagnosis not present

## 2020-09-29 HISTORY — PX: OTHER SURGICAL HISTORY: SHX169

## 2020-09-29 HISTORY — PX: ANTERIOR CERVICAL DECOMPRESSION/DISCECTOMY FUSION 4 LEVELS: SHX5556

## 2020-09-29 SURGERY — ANTERIOR CERVICAL DECOMPRESSION/DISCECTOMY FUSION 4 LEVELS
Anesthesia: General

## 2020-09-29 MED ORDER — FLEET ENEMA 7-19 GM/118ML RE ENEM: 1.0000 | ENEMA | Freq: Once | RECTAL | Status: DC | PRN

## 2020-09-29 MED ORDER — PHENYLEPHRINE HCL (PRESSORS) 10 MG/ML IV SOLN
INTRAVENOUS | Status: DC | PRN
  Administered 2020-09-29 (×5): 40 ug via INTRAVENOUS

## 2020-09-29 MED ORDER — ACETAMINOPHEN 650 MG RE SUPP
650.0000 mg | RECTAL | Status: DC | PRN
Start: 2020-09-29 — End: 2020-09-30

## 2020-09-29 MED ORDER — CHLORHEXIDINE GLUCONATE 0.12 % MT SOLN
OROMUCOSAL | Status: AC
  Administered 2020-09-29: 15 mL
  Filled 2020-09-29: qty 15

## 2020-09-29 MED ORDER — ONDANSETRON HCL 4 MG/2ML IJ SOLN
INTRAMUSCULAR | Status: DC | PRN
  Administered 2020-09-29: 4 mg via INTRAVENOUS

## 2020-09-29 MED ORDER — FENTANYL CITRATE (PF) 100 MCG/2ML IJ SOLN
25.0000 ug | INTRAMUSCULAR | Status: DC | PRN
  Administered 2020-09-29: 50 ug via INTRAVENOUS

## 2020-09-29 MED ORDER — EPINEPHRINE PF 1 MG/ML IJ SOLN
INTRAMUSCULAR | Status: AC
  Filled 2020-09-29: qty 1

## 2020-09-29 MED ORDER — 0.9 % SODIUM CHLORIDE (POUR BTL) OPTIME
TOPICAL | Status: DC | PRN
  Administered 2020-09-29: 1000 mL

## 2020-09-29 MED ORDER — CEFAZOLIN SODIUM-DEXTROSE 2-4 GM/100ML-% IV SOLN
2.0000 g | Freq: Three times a day (TID) | INTRAVENOUS | Status: DC
  Administered 2020-09-29 – 2020-09-30 (×2): 2 g via INTRAVENOUS
  Filled 2020-09-29 (×2): qty 100

## 2020-09-29 MED ORDER — PREGABALIN 75 MG PO CAPS
75.0000 mg | ORAL_CAPSULE | Freq: Two times a day (BID) | ORAL | Status: DC
  Administered 2020-09-29 – 2020-09-30 (×2): 75 mg via ORAL
  Filled 2020-09-29 (×2): qty 1

## 2020-09-29 MED ORDER — SODIUM CHLORIDE 0.9% FLUSH
3.0000 mL | Freq: Two times a day (BID) | INTRAVENOUS | Status: DC
  Administered 2020-09-29 (×2): 3 mL via INTRAVENOUS

## 2020-09-29 MED ORDER — SODIUM CHLORIDE 0.9% FLUSH: 3.0000 mL | INTRAVENOUS | Status: DC | PRN

## 2020-09-29 MED ORDER — CEFAZOLIN SODIUM-DEXTROSE 2-4 GM/100ML-% IV SOLN
INTRAVENOUS | Status: AC
  Filled 2020-09-29: qty 100

## 2020-09-29 MED ORDER — PROPOFOL 10 MG/ML IV BOLUS
INTRAVENOUS | Status: AC
  Filled 2020-09-29: qty 20

## 2020-09-29 MED ORDER — ONDANSETRON HCL 4 MG PO TABS
4.0000 mg | ORAL_TABLET | Freq: Four times a day (QID) | ORAL | Status: DC | PRN
  Administered 2020-09-30: 4 mg via ORAL
  Filled 2020-09-29: qty 1

## 2020-09-29 MED ORDER — GABAPENTIN 300 MG PO CAPS
300.0000 mg | ORAL_CAPSULE | Freq: Four times a day (QID) | ORAL | Status: DC | PRN
Start: 2020-09-29 — End: 2020-09-30

## 2020-09-29 MED ORDER — HYDROCODONE-ACETAMINOPHEN 5-325 MG PO TABS: 1.0000 | ORAL_TABLET | Freq: Four times a day (QID) | ORAL | Status: DC | PRN

## 2020-09-29 MED ORDER — BISACODYL 5 MG PO TBEC: 5.0000 mg | DELAYED_RELEASE_TABLET | Freq: Every day | ORAL | Status: DC | PRN

## 2020-09-29 MED ORDER — METOPROLOL SUCCINATE ER 100 MG PO TB24
100.0000 mg | ORAL_TABLET | Freq: Every day | ORAL | Status: DC
  Administered 2020-09-30: 100 mg via ORAL
  Filled 2020-09-29: qty 1

## 2020-09-29 MED ORDER — CEFAZOLIN SODIUM-DEXTROSE 2-4 GM/100ML-% IV SOLN
2.0000 g | Freq: Once | INTRAVENOUS | Status: AC
  Administered 2020-09-29 (×2): 2 g via INTRAVENOUS

## 2020-09-29 MED ORDER — FLUTICASONE PROPIONATE 50 MCG/ACT NA SUSP: 2.0000 | Freq: Every day | NASAL | Status: DC | PRN

## 2020-09-29 MED ORDER — POLYVINYL ALCOHOL 1.4 % OP SOLN
2.0000 [drp] | OPHTHALMIC | Status: DC | PRN
  Filled 2020-09-29: qty 15

## 2020-09-29 MED ORDER — LACTATED RINGERS IV SOLN: INTRAVENOUS | Status: DC | PRN

## 2020-09-29 MED ORDER — THROMBIN (RECOMBINANT) 20000 UNITS EX SOLR
CUTANEOUS | Status: AC
  Filled 2020-09-29: qty 20000

## 2020-09-29 MED ORDER — MIDAZOLAM HCL 2 MG/2ML IJ SOLN
INTRAMUSCULAR | Status: AC
  Filled 2020-09-29: qty 2

## 2020-09-29 MED ORDER — HYDRALAZINE HCL 20 MG/ML IJ SOLN
10.0000 mg | Freq: Once | INTRAMUSCULAR | Status: AC
  Administered 2020-09-29: 10 mg via INTRAVENOUS

## 2020-09-29 MED ORDER — ALBUTEROL SULFATE HFA 108 (90 BASE) MCG/ACT IN AERS
2.0000 | INHALATION_SPRAY | Freq: Four times a day (QID) | RESPIRATORY_TRACT | Status: DC | PRN
  Administered 2020-09-29: 2 via RESPIRATORY_TRACT
  Filled 2020-09-29: qty 6.7

## 2020-09-29 MED ORDER — LABETALOL HCL 5 MG/ML IV SOLN
10.0000 mg | Freq: Once | INTRAVENOUS | Status: AC
  Administered 2020-09-29: 10 mg via INTRAVENOUS

## 2020-09-29 MED ORDER — POVIDONE-IODINE 7.5 % EX SOLN: Freq: Once | CUTANEOUS | Status: DC

## 2020-09-29 MED ORDER — PHENYLEPHRINE 40 MCG/ML (10ML) SYRINGE FOR IV PUSH (FOR BLOOD PRESSURE SUPPORT)
PREFILLED_SYRINGE | INTRAVENOUS | Status: AC
  Filled 2020-09-29: qty 10

## 2020-09-29 MED ORDER — FENTANYL CITRATE (PF) 250 MCG/5ML IJ SOLN
INTRAMUSCULAR | Status: DC | PRN
  Administered 2020-09-29 (×4): 50 ug via INTRAVENOUS
  Administered 2020-09-29: 100 ug via INTRAVENOUS

## 2020-09-29 MED ORDER — MIDAZOLAM HCL 2 MG/2ML IJ SOLN
INTRAMUSCULAR | Status: DC | PRN
  Administered 2020-09-29: 2 mg via INTRAVENOUS

## 2020-09-29 MED ORDER — ZOLPIDEM TARTRATE 5 MG PO TABS: 5.0000 mg | ORAL_TABLET | Freq: Every evening | ORAL | Status: DC | PRN

## 2020-09-29 MED ORDER — LIDOCAINE 2% (20 MG/ML) 5 ML SYRINGE
INTRAMUSCULAR | Status: AC
  Filled 2020-09-29: qty 5

## 2020-09-29 MED ORDER — FENTANYL CITRATE (PF) 100 MCG/2ML IJ SOLN
INTRAMUSCULAR | Status: AC
  Filled 2020-09-29: qty 2

## 2020-09-29 MED ORDER — PHENYLEPHRINE HCL-NACL 10-0.9 MG/250ML-% IV SOLN
INTRAVENOUS | Status: DC | PRN
  Administered 2020-09-29: 20 ug/min via INTRAVENOUS

## 2020-09-29 MED ORDER — BUPIVACAINE HCL (PF) 0.25 % IJ SOLN
INTRAMUSCULAR | Status: DC | PRN
  Administered 2020-09-29: 20 mL

## 2020-09-29 MED ORDER — LABETALOL HCL 5 MG/ML IV SOLN
INTRAVENOUS | Status: AC
  Filled 2020-09-29: qty 4

## 2020-09-29 MED ORDER — PANTOPRAZOLE SODIUM 40 MG PO TBEC
80.0000 mg | DELAYED_RELEASE_TABLET | Freq: Every day | ORAL | Status: DC
  Administered 2020-09-30: 80 mg via ORAL
  Filled 2020-09-29: qty 2

## 2020-09-29 MED ORDER — ALBUTEROL SULFATE (2.5 MG/3ML) 0.083% IN NEBU: 2.5000 mg | INHALATION_SOLUTION | Freq: Four times a day (QID) | RESPIRATORY_TRACT | Status: DC | PRN

## 2020-09-29 MED ORDER — ACETAMINOPHEN 325 MG PO TABS
650.0000 mg | ORAL_TABLET | ORAL | Status: DC | PRN
Start: 2020-09-29 — End: 2020-09-30

## 2020-09-29 MED ORDER — HYDROXYZINE HCL 25 MG PO TABS
25.0000 mg | ORAL_TABLET | Freq: Every day | ORAL | Status: DC
  Administered 2020-09-29: 25 mg via ORAL
  Filled 2020-09-29: qty 1

## 2020-09-29 MED ORDER — POLYETHYL GLYCOL-PROPYL GLYCOL 0.4-0.3 % OP GEL: 1.0000 "application " | Freq: Two times a day (BID) | OPHTHALMIC | Status: DC

## 2020-09-29 MED ORDER — EMTRICITABINE-TENOFOVIR AF 200-25 MG PO TABS: 1.0000 | ORAL_TABLET | Freq: Every day | ORAL | Status: DC

## 2020-09-29 MED ORDER — THROMBIN 20000 UNITS EX SOLR
CUTANEOUS | Status: DC | PRN
  Administered 2020-09-29: 20000 [IU] via TOPICAL

## 2020-09-29 MED ORDER — ETOMIDATE 2 MG/ML IV SOLN
INTRAVENOUS | Status: AC
  Filled 2020-09-29: qty 10

## 2020-09-29 MED ORDER — DIPHENHYDRAMINE HCL 50 MG/ML IJ SOLN
INTRAMUSCULAR | Status: DC | PRN
  Administered 2020-09-29: 12.5 mg via INTRAVENOUS

## 2020-09-29 MED ORDER — SODIUM CHLORIDE 0.9 % IV SOLN
250.0000 mL | INTRAVENOUS | Status: DC
  Administered 2020-09-29: 250 mL via INTRAVENOUS

## 2020-09-29 MED ORDER — MOMETASONE FURO-FORMOTEROL FUM 200-5 MCG/ACT IN AERO
2.0000 | INHALATION_SPRAY | Freq: Two times a day (BID) | RESPIRATORY_TRACT | Status: DC
  Administered 2020-09-29 – 2020-09-30 (×2): 2 via RESPIRATORY_TRACT
  Filled 2020-09-29: qty 8.8

## 2020-09-29 MED ORDER — SENNOSIDES-DOCUSATE SODIUM 8.6-50 MG PO TABS: 1.0000 | ORAL_TABLET | Freq: Every evening | ORAL | Status: DC | PRN

## 2020-09-29 MED ORDER — FENTANYL CITRATE (PF) 250 MCG/5ML IJ SOLN
INTRAMUSCULAR | Status: AC
  Filled 2020-09-29: qty 5

## 2020-09-29 MED ORDER — ONDANSETRON HCL 4 MG/2ML IJ SOLN
4.0000 mg | Freq: Four times a day (QID) | INTRAMUSCULAR | Status: DC | PRN
  Administered 2020-09-29: 4 mg via INTRAVENOUS
  Filled 2020-09-29: qty 2

## 2020-09-29 MED ORDER — HYDRALAZINE HCL 20 MG/ML IJ SOLN
INTRAMUSCULAR | Status: AC
  Filled 2020-09-29: qty 1

## 2020-09-29 MED ORDER — TORSEMIDE 10 MG PO TABS
10.0000 mg | ORAL_TABLET | ORAL | Status: DC
  Administered 2020-09-30: 10 mg via ORAL
  Filled 2020-09-29: qty 1

## 2020-09-29 MED ORDER — DOCUSATE SODIUM 100 MG PO CAPS
100.0000 mg | ORAL_CAPSULE | Freq: Two times a day (BID) | ORAL | Status: DC
  Administered 2020-09-29 – 2020-09-30 (×2): 100 mg via ORAL
  Filled 2020-09-29 (×2): qty 1

## 2020-09-29 MED ORDER — MENTHOL 3 MG MT LOZG: 1.0000 | LOZENGE | OROMUCOSAL | Status: DC | PRN

## 2020-09-29 MED ORDER — ROCURONIUM BROMIDE 10 MG/ML (PF) SYRINGE
PREFILLED_SYRINGE | INTRAVENOUS | Status: DC | PRN
  Administered 2020-09-29 (×3): 10 mg via INTRAVENOUS
  Administered 2020-09-29: 60 mg via INTRAVENOUS
  Administered 2020-09-29: 10 mg via INTRAVENOUS

## 2020-09-29 MED ORDER — ROCURONIUM BROMIDE 10 MG/ML (PF) SYRINGE
PREFILLED_SYRINGE | INTRAVENOUS | Status: AC
  Filled 2020-09-29: qty 10

## 2020-09-29 MED ORDER — EMTRICITABINE-TENOFOVIR DF 200-300 MG PO TABS
1.0000 | ORAL_TABLET | Freq: Every day | ORAL | Status: DC
  Administered 2020-09-30: 1 via ORAL
  Filled 2020-09-29: qty 1

## 2020-09-29 MED ORDER — BUPIVACAINE HCL (PF) 0.25 % IJ SOLN
INTRAMUSCULAR | Status: AC
  Filled 2020-09-29: qty 10

## 2020-09-29 MED ORDER — SUGAMMADEX SODIUM 200 MG/2ML IV SOLN
INTRAVENOUS | Status: DC | PRN
  Administered 2020-09-29: 200 mg via INTRAVENOUS

## 2020-09-29 MED ORDER — OXYCODONE-ACETAMINOPHEN 5-325 MG PO TABS
1.0000 | ORAL_TABLET | ORAL | Status: DC | PRN
  Administered 2020-09-29 – 2020-09-30 (×6): 2 via ORAL
  Filled 2020-09-29 (×7): qty 2

## 2020-09-29 MED ORDER — LIDOCAINE 2% (20 MG/ML) 5 ML SYRINGE
INTRAMUSCULAR | Status: DC | PRN
  Administered 2020-09-29: 100 mg via INTRAVENOUS

## 2020-09-29 MED ORDER — PHENYLEPHRINE HCL-NACL 10-0.9 MG/250ML-% IV SOLN
INTRAVENOUS | Status: AC
  Filled 2020-09-29: qty 250

## 2020-09-29 MED ORDER — ACETAMINOPHEN 500 MG PO TABS
1000.0000 mg | ORAL_TABLET | Freq: Once | ORAL | Status: AC
  Administered 2020-09-29: 1000 mg via ORAL
  Filled 2020-09-29: qty 2

## 2020-09-29 MED ORDER — BUDESON-GLYCOPYRROL-FORMOTEROL 160-9-4.8 MCG/ACT IN AERO: 2.0000 | INHALATION_SPRAY | Freq: Two times a day (BID) | RESPIRATORY_TRACT | Status: DC

## 2020-09-29 MED ORDER — ALUM & MAG HYDROXIDE-SIMETH 200-200-20 MG/5ML PO SUSP: 30.0000 mL | Freq: Four times a day (QID) | ORAL | Status: DC | PRN

## 2020-09-29 MED ORDER — GLYCOPYRROLATE PF 0.2 MG/ML IJ SOSY
PREFILLED_SYRINGE | INTRAMUSCULAR | Status: DC | PRN
  Administered 2020-09-29: .2 mg via INTRAVENOUS

## 2020-09-29 MED ORDER — TIZANIDINE HCL 4 MG PO TABS
6.0000 mg | ORAL_TABLET | Freq: Three times a day (TID) | ORAL | Status: DC
  Administered 2020-09-29 – 2020-09-30 (×3): 6 mg via ORAL
  Filled 2020-09-29 (×3): qty 2

## 2020-09-29 MED ORDER — ONDANSETRON HCL 4 MG/2ML IJ SOLN
INTRAMUSCULAR | Status: AC
  Filled 2020-09-29: qty 2

## 2020-09-29 MED ORDER — ETOMIDATE 2 MG/ML IV SOLN
INTRAVENOUS | Status: DC | PRN
  Administered 2020-09-29: 20 mg via INTRAVENOUS

## 2020-09-29 MED ORDER — RALTEGRAVIR POTASSIUM 400 MG PO TABS
800.0000 mg | ORAL_TABLET | Freq: Every day | ORAL | Status: DC
  Administered 2020-09-29: 800 mg via ORAL
  Filled 2020-09-29 (×2): qty 2

## 2020-09-29 MED ORDER — IPRATROPIUM BROMIDE 0.02 % IN SOLN
0.5000 mg | Freq: Three times a day (TID) | RESPIRATORY_TRACT | Status: DC | PRN
  Filled 2020-09-29: qty 2.5

## 2020-09-29 MED ORDER — EPINEPHRINE 0.3 MG/0.3ML IJ SOAJ
0.3000 mg | INTRAMUSCULAR | Status: DC | PRN
  Filled 2020-09-29: qty 0.6

## 2020-09-29 MED ORDER — GLYCOPYRROLATE PF 0.2 MG/ML IJ SOSY
PREFILLED_SYRINGE | INTRAMUSCULAR | Status: AC
  Filled 2020-09-29: qty 1

## 2020-09-29 MED ORDER — SUMATRIPTAN SUCCINATE 100 MG PO TABS
100.0000 mg | ORAL_TABLET | ORAL | Status: DC | PRN
  Filled 2020-09-29: qty 1

## 2020-09-29 MED ORDER — ONDANSETRON HCL 4 MG/2ML IJ SOLN: 4.0000 mg | Freq: Once | INTRAMUSCULAR | Status: DC | PRN

## 2020-09-29 MED ORDER — PHENOL 1.4 % MT LIQD: 1.0000 | OROMUCOSAL | Status: DC | PRN

## 2020-09-29 MED ORDER — ALPRAZOLAM 0.5 MG PO TABS
0.5000 mg | ORAL_TABLET | Freq: Two times a day (BID) | ORAL | Status: DC | PRN
  Administered 2020-09-29: 0.5 mg via ORAL
  Filled 2020-09-29: qty 1

## 2020-09-29 SURGICAL SUPPLY — 76 items
AGENT HMST KT MTR STRL THRMB (HEMOSTASIS) ×1
APL SKNCLS STERI-STRIP NONHPOA (GAUZE/BANDAGES/DRESSINGS) ×1
BENZOIN TINCTURE PRP APPL 2/3 (GAUZE/BANDAGES/DRESSINGS) ×3 IMPLANT
BIT DRILL NEURO 2X3.1 SFT TUCH (MISCELLANEOUS) ×1 IMPLANT
BIT DRILL SRG 14X2.2XFLT CHK (BIT) IMPLANT
BIT DRL SRG 14X2.2XFLT CHK (BIT) ×1
BLADE CLIPPER SURG (BLADE) ×3 IMPLANT
BLADE SURG 15 STRL LF DISP TIS (BLADE) ×1 IMPLANT
BLADE SURG 15 STRL SS (BLADE) ×3
BONE VIVIGEN FORMABLE 5.4CC (Bone Implant) ×3 IMPLANT
CARTRIDGE OIL MAESTRO DRILL (MISCELLANEOUS) ×1 IMPLANT
CLOSURE STERI-STRIP 1/2X4 (GAUZE/BANDAGES/DRESSINGS) ×1
CLOSURE WOUND 1/2 X4 (GAUZE/BANDAGES/DRESSINGS) ×1
CLSR STERI-STRIP ANTIMIC 1/2X4 (GAUZE/BANDAGES/DRESSINGS) ×1 IMPLANT
COVER SURGICAL LIGHT HANDLE (MISCELLANEOUS) ×3 IMPLANT
COVER WAND RF STERILE (DRAPES) ×3 IMPLANT
DECANTER SPIKE VIAL GLASS SM (MISCELLANEOUS) ×3 IMPLANT
DIFFUSER DRILL AIR PNEUMATIC (MISCELLANEOUS) ×3 IMPLANT
DRAPE C-ARM 42X72 X-RAY (DRAPES) ×3 IMPLANT
DRAPE POUCH INSTRU U-SHP 10X18 (DRAPES) ×3 IMPLANT
DRAPE SURG 17X23 STRL (DRAPES) ×9 IMPLANT
DRILL BIT SKYLINE 14MM (BIT) ×3
DRILL NEURO 2X3.1 SOFT TOUCH (MISCELLANEOUS) ×3
DURAPREP 26ML APPLICATOR (WOUND CARE) ×3 IMPLANT
ELECT COATED BLADE 2.86 ST (ELECTRODE) ×3 IMPLANT
ELECT REM PT RETURN 9FT ADLT (ELECTROSURGICAL) ×3
ELECTRODE REM PT RTRN 9FT ADLT (ELECTROSURGICAL) ×1 IMPLANT
GAUZE 4X4 16PLY RFD (DISPOSABLE) ×3 IMPLANT
GAUZE SPONGE 4X4 12PLY STRL (GAUZE/BANDAGES/DRESSINGS) ×3 IMPLANT
GLOVE BIO SURGEON STRL SZ7 (GLOVE) ×3 IMPLANT
GLOVE BIO SURGEON STRL SZ8 (GLOVE) ×3 IMPLANT
GLOVE SRG 8 PF TXTR STRL LF DI (GLOVE) ×1 IMPLANT
GLOVE SURG UNDER POLY LF SZ7 (GLOVE) ×6 IMPLANT
GLOVE SURG UNDER POLY LF SZ8 (GLOVE) ×3
GOWN STRL REUS W/ TWL LRG LVL3 (GOWN DISPOSABLE) ×1 IMPLANT
GOWN STRL REUS W/ TWL XL LVL3 (GOWN DISPOSABLE) ×1 IMPLANT
GOWN STRL REUS W/TWL LRG LVL3 (GOWN DISPOSABLE) ×3
GOWN STRL REUS W/TWL XL LVL3 (GOWN DISPOSABLE) ×3
GRAFT BNE MATRIX VG FRMBL MD 5 (Bone Implant) IMPLANT
INTERLOCK LRDTC CRVCL VBR 6MM (Bone Implant) IMPLANT
INTERLOCK LRDTC CRVCL VBR 7MM (Bone Implant) IMPLANT
IV CATH 14GX2 1/4 (CATHETERS) ×3 IMPLANT
KIT BASIN OR (CUSTOM PROCEDURE TRAY) ×3 IMPLANT
KIT TURNOVER KIT B (KITS) ×3 IMPLANT
LORDOTIC CERVICAL VBR 6MM SM (Bone Implant) ×6 IMPLANT
LORDOTIC CERVICAL VBR 7MM SM (Bone Implant) ×3 IMPLANT
MANIFOLD NEPTUNE II (INSTRUMENTS) ×3 IMPLANT
NDL PRECISIONGLIDE 27X1.5 (NEEDLE) ×1 IMPLANT
NDL SPNL 20GX3.5 QUINCKE YW (NEEDLE) ×1 IMPLANT
NEEDLE PRECISIONGLIDE 27X1.5 (NEEDLE) ×3 IMPLANT
NEEDLE SPNL 20GX3.5 QUINCKE YW (NEEDLE) ×3 IMPLANT
NS IRRIG 1000ML POUR BTL (IV SOLUTION) ×3 IMPLANT
OIL CARTRIDGE MAESTRO DRILL (MISCELLANEOUS) ×3
PACK ORTHO CERVICAL (CUSTOM PROCEDURE TRAY) ×3 IMPLANT
PAD ARMBOARD 7.5X6 YLW CONV (MISCELLANEOUS) ×6 IMPLANT
PATTIES SURGICAL .5 X.5 (GAUZE/BANDAGES/DRESSINGS) ×2 IMPLANT
PIN DISTRACTION 14 (PIN) ×4 IMPLANT
PLATE SKYLINE 3LVL 45MM CERV (Plate) ×2 IMPLANT
POSITIONER HEAD DONUT 9IN (MISCELLANEOUS) ×3 IMPLANT
SCREW SKYLINE VAR OS 14MM (Screw) ×16 IMPLANT
SPONGE INTESTINAL PEANUT (DISPOSABLE) ×6 IMPLANT
SPONGE SURGIFOAM ABS GEL 100 (HEMOSTASIS) ×3 IMPLANT
STRIP CLOSURE SKIN 1/2X4 (GAUZE/BANDAGES/DRESSINGS) ×2 IMPLANT
SURGIFLO W/THROMBIN 8M KIT (HEMOSTASIS) ×2 IMPLANT
SUT MNCRL AB 4-0 PS2 18 (SUTURE) ×3 IMPLANT
SUT VIC AB 2-0 CT2 18 VCP726D (SUTURE) ×3 IMPLANT
SYR BULB IRRIG 60ML STRL (SYRINGE) ×3 IMPLANT
SYR CONTROL 10ML LL (SYRINGE) ×6 IMPLANT
TAPE CLOTH 4X10 WHT NS (GAUZE/BANDAGES/DRESSINGS) ×3 IMPLANT
TAPE CLOTH SURG 4X10 WHT LF (GAUZE/BANDAGES/DRESSINGS) ×2 IMPLANT
TAPE UMBILICAL COTTON 1/8X30 (MISCELLANEOUS) ×3 IMPLANT
TOWEL GREEN STERILE (TOWEL DISPOSABLE) ×3 IMPLANT
TOWEL GREEN STERILE FF (TOWEL DISPOSABLE) ×3 IMPLANT
TRAY FOLEY MTR SLVR 16FR STAT (SET/KITS/TRAYS/PACK) ×3 IMPLANT
WATER STERILE IRR 1000ML POUR (IV SOLUTION) ×3 IMPLANT
YANKAUER SUCT BULB TIP NO VENT (SUCTIONS) ×3 IMPLANT

## 2020-09-29 NOTE — Transfer of Care (Signed)
Immediate Anesthesia Transfer of Care Note  Patient: Casey Brennan  Procedure(s) Performed: ANTERIOR CERVICAL DECOMPRESSION FUSION CERVICAL 3-CERVICAL 4 , CERVICAL 4 - CERVICAL 5, CERVICAL 5 - CERVICAL 6 WITH INSTRUMENTATION AND ALLOGRAFT (N/A )  Patient Location: PACU  Anesthesia Type:General  Level of Consciousness: drowsy, patient cooperative and responds to stimulation  Airway & Oxygen Therapy: Patient Spontanous Breathing and Patient connected to face mask oxygen  Post-op Assessment: Report given to RN and Post -op Vital signs reviewed and stable  Post vital signs: Reviewed and stable  Last Vitals:  Vitals Value Taken Time  BP 153/104 09/29/20 1130  Temp    Pulse 89 09/29/20 1134  Resp 25 09/29/20 1134  SpO2 97 % 09/29/20 1134  Vitals shown include unvalidated device data.  Last Pain:  Vitals:   09/29/20 0625  TempSrc:   PainSc: 7          Complications: No complications documented.

## 2020-09-29 NOTE — Op Note (Signed)
PATIENT NAME: Casey Brennan   MEDICAL RECORD NO.:   [id number]    DATE OF BIRTH: 1961-05-26   DATE OF PROCEDURE: 09/29/2020                               OPERATIVE REPORT     PREOPERATIVE DIAGNOSES: 1. Left-sided cervical radiculopathy. 2. Spinal stenosis spanning C3-C6   POSTOPERATIVE DIAGNOSES: 1. Left-sided cervical radiculopathy. 2. Spinal stenosis spanning C3-C6   PROCEDURE: 1. Anterior cervical decompression and fusion C3/4, C4/5, C5/6 2. Placement of anterior instrumentation, C3-C6 3. Insertion of interbody device x 3 (Titan intervertebral spacers). 4. Intraoperative use of fluoroscopy. 5. Use of morselized allograft - ViviGen.   SURGEON:  Phylliss Bob, MD   ASSISTANT:  Pricilla Holm, PA-C.   ANESTHESIA:  General endotracheal anesthesia.   COMPLICATIONS:  None.   DISPOSITION:  Stable.   ESTIMATED BLOOD LOSS:  Minimal.   INDICATIONS FOR SURGERY:  Briefly, Mr. Newsome is a pleasant 60 y.o. year- old patient, who did present to me with severe pain in the neck and left arm.  The patient's MRI did reveal the findings noted above, including spinal cord compression and nerve root compression.  Given the patient's ongoing pain and lack of improvement with appropriate treatment measures, we did discuss proceeding with the procedure noted above.  The patient was fully aware of the risks and limitations of surgery as outlined in my preoperative note.   OPERATIVE DETAILS:  On 09/29/2020, the patient was brought to surgery and general endotracheal anesthesia was administered.  The patient was placed supine on the hospital bed. The neck was gently extended.  All bony prominences were meticulously padded.  The neck was prepped and draped in the usual sterile fashion.  At this point, I did make a left-sided transverse incision.  The platysma was incised.  A Smith-Robinson approach was used and the anterior spine was identified. A self-retaining retractor was placed.  I  then subperiosteally exposed the vertebral bodies from C3-C6.    Of note, there were very extensive osteophytes, which were meticulously removed using a rongeur. Caspar pins were then placed into the C5 and C6 vertebral bodies and distraction was applied.  A thorough and complete C5-6 intervertebral diskectomy was performed.  The posterior longitudinal ligament was identified and entered using a nerve hook.  I then used #1 followed by #2 Kerrison to perform a thorough and complete intervertebral diskectomy.  The spinal canal was thoroughly decompressed, as was the right and left neuroforamen.  The endplates were then prepared and the appropriate-sized intervertebral spacer was then packed with ViviGen and tamped into position in the usual fashion.  The lower Caspar pin was then removed and placed into the C4 vertebral body and once again, distraction was applied across the C4/5 intervertebral space.  I then again performed a thorough and complete diskectomy, thoroughly decompressing the spinal canal and bilateral neuroforamena.  After preparing the endplates, the appropriate-sized intervertebral spacer was packed with ViviGen and tamped into position.  The lower Caspar pin was then removed and placed into the C3 vertebral body and once again, distraction was applied across the C3/4 intervertebral space.  I then again performed a thorough and complete diskectomy, thoroughly decompressing the spinal canal and bilateral neuroforamena.  After preparing the endplates, the appropriate-sized intervertebral spacer was packed with ViviGen and tamped into position.  The Caspar pins then were removed and bone wax was placed in their place.  The appropriate-sized anterior cervical plate was placed over the anterior spine.  14 mm variable angle screws were placed, 2 in each vertebral body from C3-C5, and a single screw was placed into the C6 vertebral body, for a total of 7 vertebral body screws.  The  screws were then locked to the plate using the Cam locking mechanism.  I was very pleased with the final fluoroscopic images.  The wound was then irrigated.  The wound was then explored for any undue bleeding and there was no bleeding noted. The wound was then closed in layers using 2-0 Vicryl, followed by 4-0 Monocryl.  Benzoin and Steri-Strips were applied, followed by sterile dressing.  All instrument counts were correct at the termination of the procedure.   Of note, Pricilla Holm, PA-C, was my assistant throughout surgery, and did aid in retraction, suctioning, placement of the hardware, and closure from start to finish.       Phylliss Bob, MD

## 2020-09-29 NOTE — Anesthesia Postprocedure Evaluation (Signed)
Anesthesia Post Note  Patient: Casey Brennan  Procedure(s) Performed: ANTERIOR CERVICAL DECOMPRESSION FUSION CERVICAL 3-CERVICAL 4 , CERVICAL 4 - CERVICAL 5, CERVICAL 5 - CERVICAL 6 WITH INSTRUMENTATION AND ALLOGRAFT (N/A )     Patient location during evaluation: PACU Anesthesia Type: General Level of consciousness: awake and alert Pain management: pain level controlled Vital Signs Assessment: post-procedure vital signs reviewed and stable Respiratory status: spontaneous breathing, nonlabored ventilation and respiratory function stable Cardiovascular status: blood pressure returned to baseline and stable Postop Assessment: no apparent nausea or vomiting Anesthetic complications: no   No complications documented.  Last Vitals:  Vitals:   09/29/20 1234 09/29/20 1250  BP: (!) 167/85 (!) 150/94  Pulse:  82  Resp: 18 18  Temp: 36.7 C (!) 36.4 C  SpO2: 93% 94%    Last Pain:  Vitals:   09/29/20 1250  TempSrc: Oral  PainSc:                  Catalina Gravel

## 2020-09-29 NOTE — Progress Notes (Addendum)
Ancef ordered for patient.  Dr. Lynann Bologna aware of patient's allergies.

## 2020-09-29 NOTE — H&P (Signed)
PREOPERATIVE H&P  Chief Complaint: Left arm pain  HPI: Casey Brennan is a 60 y.o. male who presents with ongoing pain in the left arm  MRI reveals stenosis spanning C3-C6  Patient has failed multiple forms of conservative care and continues to have pain (see office notes for additional details regarding the patient's full course of treatment)  Past Medical History:  Diagnosis Date  . Arthritis    r knee   . Asthma    very rare  . Axillary lymphadenopathy   . Bronchitis   . Chronic anemia    normocytic  . Chronic folliculitis   . Elevated PSA   . Environmental and seasonal allergies   . GERD (gastroesophageal reflux disease)    Patient denies  . Gross hematuria   . H/O pericarditis    2010--  myopercarditis--  resolved  . HCAP (healthcare-associated pneumonia) 11/19/2014  . Headache(784.0)    HX SEVERE FRONTAL HA'S  . Hiatal hernia   . History of concussion    2012  &  2013  RESIDUAL HA'S --  RESOLVED  . History of gastric ulcer   . History of kidney stones   . History of MRSA infection 2010   infected boil  . HIV (human immunodeficiency virus infection) ([place]) 1988  . Hypertension   . Internal hemorrhoids   . Lytic bone lesion of hip    WORK-UP DONE BY ONCOLOGIST DR HA --  NOT MALIGNANT  . 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 Pneumonia    hx of  . Post concussion syndrome    resolved  . Prostate cancer ([place]) 04/25/13   gleason 3+3=6, volume 30 gm  . Sinusitis, chronic 05/14/2015  . Ulcer    hx of gastric  . Wears glasses    Past Surgical History:  Procedure Laterality Date  . CARDIAC CATHETERIZATION  03-23-2009  DR COOPER   NORMAL CORONARY ARTERIES  . CHOLECYSTECTOMY N/A 11/06/2014   Procedure: LAPAROSCOPIC CHOLECYSTECTOMY WITH INTRAOPERATIVE CHOLANGIOGRAM;  Surgeon: Judeth Horn, MD;  Location: [place];  Service: General;  Laterality: N/A;  . COLONOSCOPY  12/26/2011   Procedure: COLONOSCOPY;  Surgeon: Lear Ng, MD;  Location: [facility];  Service: Endoscopy;  Laterality: N/A;  . COLONOSCOPY    . COLONOSCOPY WITH PROPOFOL N/A 07/29/2014   Procedure: COLONOSCOPY WITH PROPOFOL;  Surgeon: Lear Ng, MD;  Location: [place];  Service: Endoscopy;  Laterality: N/A;  . CYSTOSCOPY N/A 05/18/2018   Procedure: CYSTOSCOPY REMOVAL FOREIGN BODY;  Surgeon: Kathie Rhodes, MD;  Location: [facility];  Service: Urology;  Laterality: N/A;  . CYSTOSCOPY/RETROGRADE/URETEROSCOPY Bilateral 04/25/2013   Procedure: CYSTOSCOPY/ BILATERAL RETROGRADES; BLADDER BIOPSIES;  Surgeon: Alexis Frock, MD;  Location: [facility];  Service: Urology;  Laterality: Bilateral;  . DENTAL EXAMINATION UNDER ANESTHESIA    . ESOPHAGOGASTRODUODENOSCOPY  12/26/2011   Procedure: ESOPHAGOGASTRODUODENOSCOPY (EGD);  Surgeon: Lear Ng, MD;  Location: [facility];  Service: Endoscopy;  Laterality: N/A;  . ESOPHAGOGASTRODUODENOSCOPY (EGD) WITH PROPOFOL N/A 07/29/2014   Procedure: ESOPHAGOGASTRODUODENOSCOPY (EGD) WITH PROPOFOL;  Surgeon: Lear Ng, MD;  Location: [place];  Service: Endoscopy;  Laterality: N/A;  . EXCISION CHRONIC LEFT BREAST ABSCESS  09-21-2010  . EXCISIONAL BX LEFT BREAST MASS/  I  &  D LEFT BREAST ABSCESS  03-24-2009  . KNEE ARTHROSCOPY Right 1985  . left axilla biopsy  04/2013  . left hip biopsy  10/2012  . LYMPHADENECTOMY Bilateral 08/18/2013   Procedure: LYMPHADENECTOMY "PELVIC LYMPH  NODE DISSECTION";  Surgeon: Alexis Frock, MD;  Location: [facility];  Service: Urology;  Laterality: Bilateral;  . PROSTATE BIOPSY N/A 04/25/2013   Procedure: BIOPSY TRANSRECTAL ULTRASONIC PROSTATE (TUBP);  Surgeon: Alexis Frock, MD;  Location: [facility];  Service: Urology;  Laterality: N/A;  . ROBOT ASSISTED LAPAROSCOPIC RADICAL PROSTATECTOMY N/A 08/18/2013   Procedure: ROBOTIC ASSISTED LAPAROSCOPIC RADICAL PROSTATECTOMY;  Surgeon: Alexis Frock, MD;  Location: [facility];  Service: Urology;  Laterality: N/A;   . TOTAL KNEE ARTHROPLASTY Right 08/01/2016   08/17/16  . TOTAL KNEE ARTHROPLASTY Right 08/01/2016   Procedure: TOTAL KNEE ARTHROPLASTY;  Surgeon: Renette Butters, MD;  Location: [place];  Service: Orthopedics;  Laterality: Right;  . UPPER GASTROINTESTINAL ENDOSCOPY    . UPPER LEG SOFT TISSUE BIOPSY Left 2012   lthigh   Social History   Socioeconomic History  . Marital status: Divorced    Spouse name: Not on file  . Number of children: 1  . Years of education: College  . Highest education level: Not on file  Occupational History  . 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 Occupation: disability  Tobacco Use  . Smoking status: Former Smoker    Packs/day: 1.50    Years: 23.00    Pack years: 34.50    Types: Cigars, Cigarettes    Quit date: 05/22/2010    Years since quitting: 10.3  . Smokeless tobacco: Never Used  Vaping Use  . Vaping Use: Never used  Substance and Sexual Activity  . Alcohol use: Not Currently    Alcohol/week: 0.0 standard drinks    Comment: 1-2 drinks a week   . Drug use: No  . Sexual activity: Not Currently    Birth control/protection: Condom    Comment: pt. declined condoms 03/11/20  Other Topics Concern  . Not on file  Social History Narrative   Sister April stays with him currently although he hopes to return to truck driving. 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 Sister is starting medical school next year.   Patient is divorced and lives with his sister.   Patient has one adult son.   Patient is part-time, driving a truck.   Patient has a college education.   Patient is right-handed.   Patient drinks 2-3 cups of coffee and a 4-5 cups of tea.   Social Determinants of Health   Financial Resource Strain: Not on file  Food Insecurity: Not on file  Transportation Needs: Not on file  Physical Activity: Not on file  Stress: Not on file  Social Connections: Not on file   Family History  Problem Relation Age of  Onset  . Stroke Father   . Diabetes Father   . Cancer Father        brain cancer  . Asthma Father   . Hypertension Sister   . Cancer Maternal Uncle        prostate cancer  . Asthma Sister   . Colon cancer Neg Hx   . Colon polyps Neg Hx   . Esophageal cancer Neg Hx   . Stomach cancer Neg Hx   . Rectal cancer Neg Hx    Allergies  Allergen Reactions  . Chocolate Shortness Of Breath and Other (See Comments)    Asthma attack, welts  . Descovy [Emtricitabine-Tenofovir Af] Shortness Of Breath    Nasuea, vomiiting, diarrhea, sob, whelps, throat closes  . Genvoya [Elviteg-Cobic-Emtricit-Tenofaf] Shortness Of Breath  Nausea, vomitting, diarreha, sob, rash, throat closing  . Morphine And Related Shortness Of Breath, Itching and Other (See Comments)     chest pain  . Penicillins Shortness Of Breath and Rash    Has patient had a PCN reaction causing immediate rash, facial/tongue/throat swelling, SOB or lightheadedness with hypotension: Yes Has patient had a PCN reaction causing severe rash involving mucus membranes or skin necrosis: No Has patient had a PCN reaction that required hospitalization No Has patient had a PCN reaction occurring within the last 10 years: Yes If all of the above answers are "NO", then may proceed with Cephalosporin use.  . Sulfa Antibiotics Anaphylaxis  . Tomato Shortness Of Breath  . Vancomycin Shortness Of Breath  . Carvedilol     LEG SWELLING, DIZZNESS  . Flagyl [Metronidazole] Itching  . Milk-Related Compounds   . Toradol [Ketorolac Tromethamine] Nausea And Vomiting    itching  . Tramadol Itching and Nausea And Vomiting   Prior to Admission medications   Medication Sig Start Date End Date Taking? Authorizing Provider  albuterol (VENTOLIN HFA) 108 (90 Base) MCG/ACT inhaler INHALE 2 PUFFS INTO THE LUNGS EVERY 6 HOURS AS NEEDED FOR WHEEZING OR SHORTNESS OF BREATH Patient taking differently: Inhale 2 puffs into the lungs every 6 (six) hours as needed for  wheezing or shortness of breath. INHALE 2 PUFFS INTO THE LUNGS EVERY 6 HOURS AS NEEDED FOR WHEEZING OR SHORTNESS OF BREATH 11/24/19  Yes Campbell Riches, MD  ALPRAZolam Duanne Moron) 0.5 MG tablet Take 1 tablet (0.5 mg total) by mouth 2 (two) times daily as needed for sleep. 09/09/20  Yes Dohmeier, Asencion Partridge, MD  aspirin EC 81 MG tablet Take 81 mg by mouth every Monday, Wednesday, and Friday.    Yes [provider]  Budeson-Glycopyrrol-Formoterol (BREZTRI AEROSPHERE) 160-9-4.8 MCG/ACT AERO Inhale 2 puffs into the lungs in the morning and at bedtime. 09/28/20  Yes Rigoberto Noel, MD  cetirizine (ZYRTEC) 10 MG tablet Take 10 mg by mouth daily. 07/21/19  Yes [provider]  cyclobenzaprine (FLEXERIL) 5 MG tablet Take 5 mg by mouth 2 (two) times daily. 08/12/20  Yes [provider]  gabapentin (NEURONTIN) 300 MG capsule Take 1 capsule (300 mg total) by mouth 4 (four) times daily as needed (pain). 05/04/20  Yes Dohmeier, Asencion Partridge, MD  Glucos-Chond-Hyal Ac-Ca Fructo (MOVE FREE JOINT HEALTH ADVANCE PO) Take 1 tablet by mouth daily.   Yes [provider]  HYDROcodone-acetaminophen (NORCO/VICODIN) 5-325 MG tablet Take 1 tablet by mouth every 6 (six) hours as needed for moderate pain.   Yes [provider]  hydrOXYzine (ATARAX/VISTARIL) 25 MG tablet Take 1 tablet (25 mg total) by mouth at bedtime. 05/24/20  Yes Dohmeier, Asencion Partridge, MD  ISENTRESS 400 MG tablet TAKE 2 TABLETS(800 MG) BY MOUTH AT BEDTIME Patient taking differently: Take 800 mg by mouth at bedtime. TAKE 2 TABLETS(800 MG) BY MOUTH AT BEDTIME 10/27/19  Yes Calone, Ples Specter, FNP  LYRICA 75 MG capsule Take 75 mg by mouth 2 (two) times daily. 08/12/20  Yes [provider]  methocarbamol (ROBAXIN) 500 MG tablet Take 500 mg by mouth every 6 (six) hours. 08/12/20  Yes [provider]  metoprolol succinate (TOPROL-XL) 100 MG 24 hr tablet Take 1 tablet (100 mg total) by mouth daily. 07/14/20  Yes Patwardhan, Manish  J, MD  mometasone-formoterol (DULERA) 200-5 MCG/ACT AERO INHALE 2 PUFFS BY MOUTH EVERY MORNING AND 2 PUFFS 12 HOURS LATER Patient taking differently: Inhale 2 puffs into the lungs in  the morning and at bedtime. INHALE 2 PUFFS BY MOUTH EVERY MORNING AND 2 PUFFS 12 HOURS LATER 03/22/20  Yes Tanda Rockers, MD  Multiple Vitamin (MULTIVITAMIN WITH MINERALS) TABS tablet Take 1 tablet by mouth daily after breakfast.    Yes [provider]  omeprazole (PRILOSEC) 40 MG capsule Take 40 mg by mouth daily.   Yes [provider]  OVER THE COUNTER MEDICATION Take 2 capsules by mouth daily. Zhou: thyroid supplement   Yes [provider]  Polyethyl Glycol-Propyl Glycol (SYSTANE) 0.4-0.3 % GEL ophthalmic gel Place 1 application into both eyes 2 (two) times daily.    Yes [provider]  tizanidine (ZANAFLEX) 6 MG capsule Take 1 capsule (6 mg total) by mouth 3 (three) times daily. 06/15/20  Yes Dohmeier, Asencion Partridge, MD  torsemide (DEMADEX) 20 MG tablet Take 10 mg by mouth See admin instructions. Tues Thurs and Sat   Yes [provider]  TRUVADA 200-300 MG tablet Take 1 tablet by mouth daily. 10/27/19  Yes Golden Circle, FNP  albuterol (PROVENTIL) (2.5 MG/3ML) 0.083% nebulizer solution Take 3 mLs (2.5 mg total) by nebulization every 6 (six) hours as needed for wheezing or shortness of breath. 09/28/20   Rigoberto Noel, MD  Beclomethasone Dipropionate (QNASL) 80 MCG/ACT AERS Place 2 sprays into the nose daily. 05/11/20   Lauraine Rinne, NP  diclofenac Sodium (VOLTAREN) 1 % GEL APPLY 2 GRAMS EXTERNALLY TO THE AFFECTED AREA TWICE DAILY AS NEEDED FOR PAIN Patient taking differently: Apply 2 g topically 2 (two) times daily as needed (pain). APPLY 2 GRAMS EXTERNALLY TO THE AFFECTED AREA TWICE DAILY AS NEEDED FOR PAIN 06/16/19   Campbell Riches, MD  EPINEPHrine 0.3 mg/0.3 mL IJ SOAJ injection Inject 0.3 mLs (0.3 mg total) into the muscle as needed for anaphylaxis. 05/19/19   Ward,  Cyril Mourning N, DO  ipratropium (ATROVENT) 0.02 % nebulizer solution USE 2.5 ML(0.5 MG) VIA NEBULIZER THREE TIMES DAILY AS NEEDED FOR WHEEZING OR SHORTNESS OF BREATH Patient taking differently: Take 0.5 mg by nebulization 3 (three) times daily as needed for wheezing or shortness of breath. 11/02/16   Mignon Pine, DO  mupirocin ointment (BACTROBAN) 2 % APPLY EXTERNALLY TO THE AFFECTED AREA TWICE DAILY FOR 3 WEEKS, PLACE COTTON BETWEEN TOES TO ALLOW BETTER AERATION AS NEEDED 03/24/20   Campbell Riches, MD  SUMAtriptan (IMITREX) 100 MG tablet TAKE 1 TABLET BY MOUTH EVERY 2 HOURS AS NEEDED FOR MIGRAINE, MAY REPEAT IN 2 HOURS IF HEADACHE PERSISTS OR RECURS Patient taking differently: Take 100 mg by mouth every 2 (two) hours as needed for migraine. TAKE 1 TABLET BY MOUTH EVERY 2 HOURS AS NEEDED FOR MIGRAINE, MAY REPEAT IN 2 HOURS IF HEADACHE PERSISTS OR RECURS 05/04/20   Dohmeier, Asencion Partridge, MD  zolpidem (AMBIEN) 5 MG tablet Take 1-2 tablets (5-10 mg total) by mouth at bedtime as needed for sleep. Patient not taking: Reported on 04/23/2019 12/21/16 05/19/19  Mignon Pine, DO     All other systems have been reviewed and were otherwise negative with the exception of those mentioned in the HPI and as above.  Physical Exam: Vitals:   09/29/20 0615  BP: (!) 168/92  Pulse: 68  Resp: 18  Temp: 97.7 F (36.5 C)  SpO2: 95%    Body mass index is 33.97 kg/m.  General: Alert, no acute distress Cardiovascular: No pedal edema Respiratory: No cyanosis, no use of accessory musculature Skin: No lesions in the area of chief complaint Neurologic:  Sensation intact distally Psychiatric: Patient is competent for consent with normal mood and affect Lymphatic: No axillary or cervical lymphadenopathy   Assessment/Plan: NEUROFORAMINAL STENOSIS AND SPINAL CORD COMPRESSION SPANNING CERVICAL 3 - CERVICAL 6 Plan for Procedure(s): ANTERIOR CERVICAL DECOMPRESSION FUSION CERVICAL 3-CERVICAL 4 , CERVICAL 4 -  CERVICAL 5, CERVICAL 5 - CERVICAL 6 WITH INSTRUMENTATION AND ALLOGRAFT   Norva Karvonen, MD 09/29/2020 7:13 AM

## 2020-09-29 NOTE — Anesthesia Procedure Notes (Signed)
Procedure Name: Intubation Date/Time: 09/29/2020 7:53 AM Performed by: Michele Rockers, CRNA Pre-anesthesia Checklist: Patient identified, Patient being monitored, Timeout performed, Emergency Drugs available and Suction available Patient Re-evaluated:Patient Re-evaluated prior to induction Oxygen Delivery Method: Circle System Utilized Preoxygenation: Pre-oxygenation with 100% oxygen Induction Type: IV induction Ventilation: Mask ventilation without difficulty Laryngoscope Size: Mac, Glidescope and 3 Grade View: Grade I Tube type: Oral Tube size: 7.5 mm Number of attempts: 1 Airway Equipment and Method: Stylet Placement Confirmation: ETT inserted through vocal cords under direct vision,  positive ETCO2 and breath sounds checked- equal and bilateral Secured at: 22 cm Tube secured with: Tape Dental Injury: Teeth and Oropharynx as per pre-operative assessment

## 2020-09-30 DIAGNOSIS — J449 Chronic obstructive pulmonary disease, unspecified: Secondary | ICD-10-CM | POA: Diagnosis not present

## 2020-09-30 DIAGNOSIS — Z87891 Personal history of nicotine dependence: Secondary | ICD-10-CM | POA: Diagnosis not present

## 2020-09-30 DIAGNOSIS — K219 Gastro-esophageal reflux disease without esophagitis: Secondary | ICD-10-CM | POA: Diagnosis not present

## 2020-09-30 DIAGNOSIS — Z79899 Other long term (current) drug therapy: Secondary | ICD-10-CM | POA: Diagnosis not present

## 2020-09-30 DIAGNOSIS — Z7951 Long term (current) use of inhaled steroids: Secondary | ICD-10-CM | POA: Diagnosis not present

## 2020-09-30 DIAGNOSIS — J45909 Unspecified asthma, uncomplicated: Secondary | ICD-10-CM | POA: Diagnosis not present

## 2020-09-30 DIAGNOSIS — M4712 Other spondylosis with myelopathy, cervical region: Secondary | ICD-10-CM | POA: Diagnosis not present

## 2020-09-30 MED ORDER — OXYCODONE-ACETAMINOPHEN 5-325 MG PO TABS: 1.0000 | ORAL_TABLET | ORAL | 0 refills | Status: DC | PRN

## 2020-09-30 MED FILL — Thrombin (Recombinant) For Soln 20000 Unit: CUTANEOUS | Qty: 1 | Status: AC

## 2020-09-30 NOTE — Progress Notes (Signed)
    Patient doing well  Denies arm pain Tolerating PO well   Physical Exam: Vitals:   09/30/20 0354 09/30/20 0745  BP: (!) 157/96 (!) 157/95  Pulse: 70 85  Resp: 18 18  Temp: 98.4 F (36.9 C) 98.2 F (36.8 C)  SpO2: 96% 96%    Neck soft/supple Dressing in place NVI  POD #1 s/p ACDF, doing well  - encourage ambulation - Percocet for pain, Robaxin for muscle spasms - d/c home today with f/u in 2 weeks

## 2020-09-30 NOTE — Progress Notes (Signed)
Patient is discharged from room 3C04 at this time. Alert and in stable condition. IV site d/c'd and instructions read to patient and sister, with understanding verbalized and all questions answered. Left unit via wheelchair with all belongings at side.

## 2020-10-01 ENCOUNTER — Encounter (HOSPITAL_COMMUNITY): Payer: Self-pay | Admitting: Orthopedic Surgery

## 2020-10-01 DIAGNOSIS — J40 Bronchitis, not specified as acute or chronic: Secondary | ICD-10-CM | POA: Diagnosis not present

## 2020-10-01 DIAGNOSIS — G959 Disease of spinal cord, unspecified: Secondary | ICD-10-CM | POA: Diagnosis not present

## 2020-10-01 DIAGNOSIS — Z4789 Encounter for other orthopedic aftercare: Secondary | ICD-10-CM | POA: Diagnosis not present

## 2020-10-01 DIAGNOSIS — M4322 Fusion of spine, cervical region: Secondary | ICD-10-CM | POA: Diagnosis not present

## 2020-10-01 DIAGNOSIS — D649 Anemia, unspecified: Secondary | ICD-10-CM | POA: Diagnosis not present

## 2020-10-01 DIAGNOSIS — Z79899 Other long term (current) drug therapy: Secondary | ICD-10-CM | POA: Diagnosis not present

## 2020-10-01 DIAGNOSIS — R2681 Unsteadiness on feet: Secondary | ICD-10-CM | POA: Diagnosis not present

## 2020-10-01 DIAGNOSIS — Z87891 Personal history of nicotine dependence: Secondary | ICD-10-CM | POA: Diagnosis not present

## 2020-10-01 DIAGNOSIS — G622 Polyneuropathy due to other toxic agents: Secondary | ICD-10-CM | POA: Diagnosis not present

## 2020-10-02 ENCOUNTER — Encounter: Payer: Self-pay | Admitting: Student

## 2020-10-03 ENCOUNTER — Telehealth: Payer: Self-pay | Admitting: Student

## 2020-10-03 DIAGNOSIS — I10 Essential (primary) hypertension: Secondary | ICD-10-CM

## 2020-10-03 NOTE — Telephone Encounter (Signed)
ON-CALL CARDIOLOGY 10/02/2020 11:05 am   Patient's name: Casey Brennan.   MRN: [id number].    DOB: [birth year] Primary care provider: Biagio Borg, MD. Primary cardiologist: Dr. Jorge Ny regarding this patient's care today: Patient's sister April called on his behalf with concerns that patient's blood pressure was elevated at 160/92 mmHg. Patient underwent anterior cervical spine fusion on 09/29/2020. When he was admitted for surgery patient's metoprolol had been held, he has not resumed. Therefore patient has not taken metoprolol in the last 3 days. Patient is in pain from recent spine surgery, but is otherwise asymptomatic.   Patient's sister also noted that his oxygen saturation was 93%, briefly but improved to 97% when patient took a few deep breaths.   Impression:   ICD-10-CM   1. Primary hypertension  I10     No orders of the defined types were placed in this encounter.   No orders of the defined types were placed in this encounter.   Recommendations: Advised patient to restart metoprolol succinate 100 mg daily and to notify our office if his blood pressure remains elevated, his oxygen drops further, or he develops cardiovascular symptoms.   Telephone encounter total time: 12 minutes     Alethia Berthold, PA-C 10/03/2020, 10:09 PM Office: [phone]

## 2020-10-05 ENCOUNTER — Telehealth: Payer: Self-pay

## 2020-10-05 NOTE — Telephone Encounter (Signed)
Received notification that patient's sumatriptan was not covered by insurance. RN called [facility] who states no prior authorization is needed and that rejection is coming from pharmacy's end. RN spoke to Stanley at [facility] who states medication is being filled without difficulty, but that patient has been picking up every 10 days.   RN spoke to patient's sister who states they have had no difficulty filling the patient's sumatriptan and that they have been filling so frequently due to an error by pharmacy of only providing 1 tablet when she went to pick up. She states patient recently had cervical spine surgery and that they wanted to have the medication on hand. RN advised patient's sister to continue to follow Dr. Edwena Felty direction regarding sumatriptan.   She is also requesting refill of patient's epi pen. RN advised her to contact patient's PCP. Patient's sister verbalized understanding and has no further questions.   Beryle Flock, RN

## 2020-10-06 DIAGNOSIS — R2681 Unsteadiness on feet: Secondary | ICD-10-CM | POA: Diagnosis not present

## 2020-10-06 DIAGNOSIS — Z4789 Encounter for other orthopedic aftercare: Secondary | ICD-10-CM | POA: Diagnosis not present

## 2020-10-06 DIAGNOSIS — Z79899 Other long term (current) drug therapy: Secondary | ICD-10-CM | POA: Diagnosis not present

## 2020-10-06 DIAGNOSIS — M4322 Fusion of spine, cervical region: Secondary | ICD-10-CM | POA: Diagnosis not present

## 2020-10-06 DIAGNOSIS — D649 Anemia, unspecified: Secondary | ICD-10-CM | POA: Diagnosis not present

## 2020-10-06 DIAGNOSIS — J40 Bronchitis, not specified as acute or chronic: Secondary | ICD-10-CM | POA: Diagnosis not present

## 2020-10-06 DIAGNOSIS — Z87891 Personal history of nicotine dependence: Secondary | ICD-10-CM | POA: Diagnosis not present

## 2020-10-06 DIAGNOSIS — G622 Polyneuropathy due to other toxic agents: Secondary | ICD-10-CM | POA: Diagnosis not present

## 2020-10-07 ENCOUNTER — Other Ambulatory Visit: Payer: Self-pay | Admitting: Infectious Diseases

## 2020-10-07 DIAGNOSIS — G43009 Migraine without aura, not intractable, without status migrainosus: Secondary | ICD-10-CM

## 2020-10-07 DIAGNOSIS — M4802 Spinal stenosis, cervical region: Secondary | ICD-10-CM | POA: Diagnosis not present

## 2020-10-07 DIAGNOSIS — Z981 Arthrodesis status: Secondary | ICD-10-CM | POA: Diagnosis not present

## 2020-10-08 DIAGNOSIS — Z87891 Personal history of nicotine dependence: Secondary | ICD-10-CM | POA: Diagnosis not present

## 2020-10-08 DIAGNOSIS — M4322 Fusion of spine, cervical region: Secondary | ICD-10-CM | POA: Diagnosis not present

## 2020-10-08 DIAGNOSIS — D649 Anemia, unspecified: Secondary | ICD-10-CM | POA: Diagnosis not present

## 2020-10-08 DIAGNOSIS — J40 Bronchitis, not specified as acute or chronic: Secondary | ICD-10-CM | POA: Diagnosis not present

## 2020-10-08 DIAGNOSIS — Z79899 Other long term (current) drug therapy: Secondary | ICD-10-CM | POA: Diagnosis not present

## 2020-10-08 DIAGNOSIS — Z4789 Encounter for other orthopedic aftercare: Secondary | ICD-10-CM | POA: Diagnosis not present

## 2020-10-08 DIAGNOSIS — R2681 Unsteadiness on feet: Secondary | ICD-10-CM | POA: Diagnosis not present

## 2020-10-08 DIAGNOSIS — G622 Polyneuropathy due to other toxic agents: Secondary | ICD-10-CM | POA: Diagnosis not present

## 2020-10-12 DIAGNOSIS — R2681 Unsteadiness on feet: Secondary | ICD-10-CM | POA: Diagnosis not present

## 2020-10-12 DIAGNOSIS — Z79899 Other long term (current) drug therapy: Secondary | ICD-10-CM | POA: Diagnosis not present

## 2020-10-12 DIAGNOSIS — J40 Bronchitis, not specified as acute or chronic: Secondary | ICD-10-CM | POA: Diagnosis not present

## 2020-10-12 DIAGNOSIS — G622 Polyneuropathy due to other toxic agents: Secondary | ICD-10-CM | POA: Diagnosis not present

## 2020-10-12 DIAGNOSIS — Z4789 Encounter for other orthopedic aftercare: Secondary | ICD-10-CM | POA: Diagnosis not present

## 2020-10-12 DIAGNOSIS — D649 Anemia, unspecified: Secondary | ICD-10-CM | POA: Diagnosis not present

## 2020-10-12 DIAGNOSIS — M4322 Fusion of spine, cervical region: Secondary | ICD-10-CM | POA: Diagnosis not present

## 2020-10-12 DIAGNOSIS — Z87891 Personal history of nicotine dependence: Secondary | ICD-10-CM | POA: Diagnosis not present

## 2020-10-13 ENCOUNTER — Ambulatory Visit: Payer: Medicare Other | Admitting: Cardiology

## 2020-10-13 NOTE — Discharge Summary (Signed)
Patient ID: Casey Brennan MRN: [id number] DOB/AGE: 1961/06/04 60 y.o.  Admit date: 09/29/2020 Discharge date: 09/30/2020  Admission Diagnoses:  Active Problems:   Myelopathy [facility])   Discharge Diagnoses:  Same  Past Medical History:  Diagnosis Date  . Arthritis    r knee   . Asthma    very rare  . Axillary lymphadenopathy   . Bronchitis   . Chronic anemia    normocytic  . Chronic folliculitis   . Elevated PSA   . Environmental and seasonal allergies   . GERD (gastroesophageal reflux disease)    Patient denies  . Gross hematuria   . H/O pericarditis    2010--  myopercarditis--  resolved  . HCAP (healthcare-associated pneumonia) 11/19/2014  . Headache(784.0)    HX SEVERE FRONTAL HA'S  . Hiatal hernia   . History of concussion    2012  &  2013  RESIDUAL HA'S --  RESOLVED  . History of gastric ulcer   . History of kidney stones   . History of MRSA infection 2010   infected boil  . HIV (human immunodeficiency virus infection) ([place]) 1988  . Hypertension   . Internal hemorrhoids   . Lytic bone lesion of hip    WORK-UP DONE BY ONCOLOGIST DR HA --  NOT MALIGNANT  . 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 Pneumonia    hx of  . Post concussion syndrome    resolved  . Prostate cancer ([place]) 04/25/13   gleason 3+3=6, volume 30 gm  . Sinusitis, chronic 05/14/2015  . Ulcer    hx of gastric  . Wears glasses     Surgeries: Procedure(s): ANTERIOR CERVICAL DECOMPRESSION FUSION CERVICAL 3-CERVICAL 4 , CERVICAL 4 - CERVICAL 5, CERVICAL 5 - CERVICAL 6 WITH INSTRUMENTATION AND ALLOGRAFT on 09/29/2020   Consultants: None  Discharged Condition: Improved  Hospital Course: Casey Brennan is an 60 y.o. male who was admitted 09/29/2020 for operative treatment of radiculopathy. Patient has severe unremitting pain that affects sleep, daily activities, and work/hobbies. After pre-op clearance the patient was taken to the operating room on 09/29/2020 and underwent   Procedure(s): ANTERIOR CERVICAL DECOMPRESSION FUSION CERVICAL 3-CERVICAL 4 , CERVICAL 4 - CERVICAL 5, CERVICAL 5 - CERVICAL 6 WITH INSTRUMENTATION AND ALLOGRAFT.    Patient was given perioperative antibiotics:  Anti-infectives (From admission, onward)   Start     Dose/Rate Route Frequency Ordered Stop   09/30/20 1000  emtricitabine-tenofovir (TRUVADA) 200-300 MG per tablet 1 tablet  Status:  Discontinued        1 tablet Oral Daily 09/29/20 1315 09/30/20 1927   09/29/20 2200  raltegravir (ISENTRESS) tablet 800 mg  Status:  Discontinued        800 mg Oral Daily at bedtime 09/29/20 1255 09/30/20 1927   09/29/20 1930  ceFAZolin (ANCEF) IVPB 2g/100 mL premix  Status:  Discontinued        2 g 200 mL/hr over 30 Minutes Intravenous Every 8 hours 09/29/20 1321 09/30/20 1927   09/29/20 1345  emtricitabine-tenofovir AF (DESCOVY) 200-25 MG per tablet 1 tablet  Status:  Discontinued        1 tablet Oral Daily 09/29/20 1255 09/29/20 1314   09/29/20 0730  ceFAZolin (ANCEF) IVPB 2g/100 mL premix        2 g 200 mL/hr over 30 Minutes Intravenous  Once 09/29/20 0718 09/29/20 1130   09/29/20 0718  ceFAZolin (ANCEF) 2-4 GM/100ML-% IVPB       Note to Pharmacy:  Casey Brennan, Casey Brennan   : cabinet override      09/29/20 0718 09/29/20 0739   09/29/20 0700  vancomycin (VANCOREADY) IVPB 1500 mg/300 mL  Status:  Discontinued        1,500 mg 150 mL/hr over 120 Minutes Intravenous  Once 09/28/20 1102 09/29/20 0717       Patient was given sequential compression devices, early ambulation to prevent DVT.  Patient benefited maximally from hospital stay and there were no complications.    Recent vital signs: BP (!) 157/95 (BP Location: Left Arm)   Pulse 85   Temp 98.2 F (36.8 C) (Oral)   Resp 18   Ht 5\' 9"  (1.753 m)   Wt 104.3 kg   SpO2 96%   BMI 33.97 kg/m    Discharge Medications:   Allergies as of 09/30/2020      Reactions   Chocolate Shortness Of Breath, Other (See Comments)   Asthma attack, welts   Descovy  [emtricitabine-tenofovir Af] Shortness Of Breath   Nasuea, vomiiting, diarrhea, sob, whelps, throat closes   Genvoya [elviteg-cobic-emtricit-tenofaf] Shortness Of Breath   Nausea, vomitting, diarreha, sob, rash, throat closing   Morphine And Related Shortness Of Breath, Itching, Other (See Comments)    chest pain   Penicillins Shortness Of Breath, Rash   Has patient had a PCN reaction causing immediate rash, facial/tongue/throat swelling, SOB or lightheadedness with hypotension: Yes Has patient had a PCN reaction causing severe rash involving mucus membranes or skin necrosis: No Has patient had a PCN reaction that required hospitalization No Has patient had a PCN reaction occurring within the last 10 years: Yes If all of the above answers are "NO", then may proceed with Cephalosporin use.   Sulfa Antibiotics Anaphylaxis   Tomato Shortness Of Breath   Vancomycin Shortness Of Breath   Carvedilol    LEG SWELLING, DIZZNESS   Flagyl [metronidazole] Itching   Milk-related Compounds    Toradol [ketorolac Tromethamine] Nausea And Vomiting   itching   Tramadol Itching, Nausea And Vomiting      Medication List    STOP taking these medications   HYDROcodone-acetaminophen 5-325 MG tablet Commonly known as: NORCO/VICODIN     TAKE these medications   albuterol 108 (90 Base) MCG/ACT inhaler Commonly known as: VENTOLIN HFA INHALE 2 PUFFS INTO THE LUNGS EVERY 6 HOURS AS NEEDED FOR WHEEZING OR SHORTNESS OF BREATH What changed: additional instructions   albuterol (2.5 MG/3ML) 0.083% nebulizer solution Commonly known as: PROVENTIL Take 3 mLs (2.5 mg total) by nebulization every 6 (six) hours as needed for wheezing or shortness of breath. What changed: Another medication with the same name was changed. Make sure you understand how and when to take each.   ALPRAZolam 0.5 MG tablet Commonly known as: XANAX Take 1 tablet (0.5 mg total) by mouth 2 (two) times daily as needed for sleep.   aspirin  EC 81 MG tablet Take 81 mg by mouth every Monday, Wednesday, and Friday.   Breztri Aerosphere 160-9-4.8 MCG/ACT Aero Generic drug: Budeson-Glycopyrrol-Formoterol Inhale 2 puffs into the lungs in the morning and at bedtime.   cetirizine 10 MG tablet Commonly known as: ZYRTEC Take 10 mg by mouth daily.   cyclobenzaprine 5 MG tablet Commonly known as: FLEXERIL Take 5 mg by mouth 2 (two) times daily.   diclofenac Sodium 1 % Gel Commonly known as: VOLTAREN APPLY 2 GRAMS EXTERNALLY TO THE AFFECTED AREA TWICE DAILY AS NEEDED FOR PAIN What changed:   how much to take  how to  take this  when to take this  reasons to take this   Dulera 200-5 MCG/ACT Aero Generic drug: mometasone-formoterol INHALE 2 PUFFS BY MOUTH EVERY MORNING AND 2 PUFFS 12 HOURS LATER What changed:   how much to take  how to take this  when to take this   EPINEPHrine 0.3 mg/0.3 mL Soaj injection Commonly known as: EPI-PEN Inject 0.3 mLs (0.3 mg total) into the muscle as needed for anaphylaxis.   gabapentin 300 MG capsule Commonly known as: NEURONTIN Take 1 capsule (300 mg total) by mouth 4 (four) times daily as needed (pain).   hydrOXYzine 25 MG tablet Commonly known as: ATARAX/VISTARIL Take 1 tablet (25 mg total) by mouth at bedtime.   ipratropium 0.02 % nebulizer solution Commonly known as: ATROVENT USE 2.5 ML(0.5 MG) VIA NEBULIZER THREE TIMES DAILY AS NEEDED FOR WHEEZING OR SHORTNESS OF BREATH What changed:   how much to take  how to take this  when to take this  reasons to take this  additional instructions   Isentress 400 MG tablet Generic drug: raltegravir TAKE 2 TABLETS(800 MG) BY MOUTH AT BEDTIME What changed:   how much to take  how to take this  when to take this   Lyrica 75 MG capsule Generic drug: pregabalin Take 75 mg by mouth 2 (two) times daily.   metoprolol succinate 100 MG 24 hr tablet Commonly known as: TOPROL-XL Take 1 tablet (100 mg total) by mouth  daily.   MOVE FREE JOINT HEALTH ADVANCE PO Take 1 tablet by mouth daily.   multivitamin with minerals Tabs tablet Take 1 tablet by mouth daily after breakfast.   mupirocin ointment 2 % Commonly known as: BACTROBAN APPLY EXTERNALLY TO THE AFFECTED AREA TWICE DAILY FOR 3 WEEKS, PLACE COTTON BETWEEN TOES TO ALLOW BETTER AERATION AS NEEDED   omeprazole 40 MG capsule Commonly known as: PRILOSEC Take 40 mg by mouth daily.   OVER THE COUNTER MEDICATION Take 2 capsules by mouth daily. Zhou: thyroid supplement   oxyCODONE-acetaminophen 5-325 MG tablet Commonly known as: PERCOCET/ROXICET Take 1-2 tablets by mouth every 4 (four) hours as needed for moderate pain or severe pain.   Polyethyl Glycol-Propyl Glycol 0.4-0.3 % Gel ophthalmic gel Commonly known as: SYSTANE Place 1 application into both eyes 2 (two) times daily.   Qnasl 80 MCG/ACT Aers Generic drug: Beclomethasone Dipropionate Place 2 sprays into the nose daily.   SUMAtriptan 100 MG tablet Commonly known as: IMITREX TAKE 1 TABLET BY MOUTH EVERY 2 HOURS AS NEEDED FOR MIGRAINE, MAY REPEAT IN 2 HOURS IF HEADACHE PERSISTS OR RECURS What changed:   how much to take  how to take this  when to take this  reasons to take this   tizanidine 6 MG capsule Commonly known as: Zanaflex Take 1 capsule (6 mg total) by mouth 3 (three) times daily.   torsemide 20 MG tablet Commonly known as: DEMADEX Take 10 mg by mouth See admin instructions. Tues Thurs and Sat   Truvada 200-300 MG tablet Generic drug: emtricitabine-tenofovir Take 1 tablet by mouth daily.       Diagnostic Studies: DG Chest 2 View  Result Date: 09/28/2020 CLINICAL DATA:  COPD EXAM: CHEST - 2 VIEW COMPARISON:  August 16, 2020 FINDINGS: There has been interval clearing of infiltrate from the right lower lung region. Currently there is no edema or airspace opacity. There is mild central peribronchial thickening. Heart size and pulmonary vascularity are normal. No  adenopathy. There is mild midthoracic levoscoliosis. IMPRESSION: No  edema or airspace opacity. Central peribronchial thickening may represent a degree of bronchitis. Cardiac silhouette within normal limits. Electronically Signed   By: Lowella Grip III M.D.   On: 09/28/2020 16:23   DG Cervical Spine 1 View  Result Date: 09/29/2020 CLINICAL DATA:  Provided history: Surgery, elective. Additional history provided: Anterior cervical decompression fusion cervical 3-cervical 4, cervical 4-cervical 5, cervical 5-cervical 6. Provided fluoroscopy time 11 seconds (1.14 mGy). EXAM: DG CERVICAL SPINE - 1 VIEW; DG C-ARM 1-60 MIN COMPARISON:  Cervical spine MRI 08/18/2020. FINDINGS: A single lateral view intraoperative fluoroscopic image of the cervical spine is submitted. The image demonstrates sequela of interval is C3-C6 ACDF utilizing a ventral plate and screws. Interbody devices are present at C3-C4, C4-C5 and C5-C6. Partially visualized ET tube. Curvilinear hyperdensity projects ventral to the cervical spine at the C5-C6 level, likely reflecting a surgical sponge/packing material. IMPRESSION: Single lateral view intraoperative fluoroscopic image of the cervical spine from C3-C6 ACDF, as described. Curvilinear hyperdensity projects ventral to the cervical spine at the C5-C6 level, likely reflecting a surgical sponge/packing material. Correlate with the operative history. Electronically Signed   By: Kellie Simmering DO   On: 09/29/2020 11:41   DG C-Arm 1-60 Min  Result Date: 09/29/2020 CLINICAL DATA:  Provided history: Surgery, elective. Additional history provided: Anterior cervical decompression fusion cervical 3-cervical 4, cervical 4-cervical 5, cervical 5-cervical 6. Provided fluoroscopy time 11 seconds (1.14 mGy). EXAM: DG CERVICAL SPINE - 1 VIEW; DG C-ARM 1-60 MIN COMPARISON:  Cervical spine MRI 08/18/2020. FINDINGS: A single lateral view intraoperative fluoroscopic image of the cervical spine is submitted. The  image demonstrates sequela of interval is C3-C6 ACDF utilizing a ventral plate and screws. Interbody devices are present at C3-C4, C4-C5 and C5-C6. Partially visualized ET tube. Curvilinear hyperdensity projects ventral to the cervical spine at the C5-C6 level, likely reflecting a surgical sponge/packing material. IMPRESSION: Single lateral view intraoperative fluoroscopic image of the cervical spine from C3-C6 ACDF, as described. Curvilinear hyperdensity projects ventral to the cervical spine at the C5-C6 level, likely reflecting a surgical sponge/packing material. Correlate with the operative history. Electronically Signed   By: Kellie Simmering DO   On: 09/29/2020 11:41    Disposition: Discharge disposition: 01-Home or Self Care        POD #1 s/p ACDF, doing well  - encourage ambulation - Percocet for pain, Robaxin for muscle spasms -Scripts for pain sent to pharmacy electronically  -D/C instructions sheet printed and in chart -D/C today  -F/U in office 2 weeks   Signed: Lennie Muckle Kayle Correa 10/13/2020, 3:23 PM

## 2020-10-15 DIAGNOSIS — D649 Anemia, unspecified: Secondary | ICD-10-CM | POA: Diagnosis not present

## 2020-10-15 DIAGNOSIS — Z4789 Encounter for other orthopedic aftercare: Secondary | ICD-10-CM | POA: Diagnosis not present

## 2020-10-15 DIAGNOSIS — R2681 Unsteadiness on feet: Secondary | ICD-10-CM | POA: Diagnosis not present

## 2020-10-15 DIAGNOSIS — M4322 Fusion of spine, cervical region: Secondary | ICD-10-CM | POA: Diagnosis not present

## 2020-10-15 DIAGNOSIS — Z87891 Personal history of nicotine dependence: Secondary | ICD-10-CM | POA: Diagnosis not present

## 2020-10-15 DIAGNOSIS — Z79899 Other long term (current) drug therapy: Secondary | ICD-10-CM | POA: Diagnosis not present

## 2020-10-15 DIAGNOSIS — G622 Polyneuropathy due to other toxic agents: Secondary | ICD-10-CM | POA: Diagnosis not present

## 2020-10-15 DIAGNOSIS — J40 Bronchitis, not specified as acute or chronic: Secondary | ICD-10-CM | POA: Diagnosis not present

## 2020-10-16 ENCOUNTER — Encounter: Payer: Self-pay | Admitting: Internal Medicine

## 2020-10-18 MED ORDER — CETIRIZINE HCL 10 MG PO TABS: 10.0000 mg | ORAL_TABLET | Freq: Every day | ORAL | 3 refills | Status: DC

## 2020-10-18 MED ORDER — EPINEPHRINE 0.3 MG/0.3ML IJ SOAJ: 0.3000 mg | INTRAMUSCULAR | 5 refills | Status: DC | PRN

## 2020-10-19 DIAGNOSIS — Z4789 Encounter for other orthopedic aftercare: Secondary | ICD-10-CM | POA: Diagnosis not present

## 2020-10-19 DIAGNOSIS — M4322 Fusion of spine, cervical region: Secondary | ICD-10-CM | POA: Diagnosis not present

## 2020-10-19 DIAGNOSIS — R2681 Unsteadiness on feet: Secondary | ICD-10-CM | POA: Diagnosis not present

## 2020-10-19 DIAGNOSIS — G622 Polyneuropathy due to other toxic agents: Secondary | ICD-10-CM | POA: Diagnosis not present

## 2020-10-19 DIAGNOSIS — D649 Anemia, unspecified: Secondary | ICD-10-CM | POA: Diagnosis not present

## 2020-10-19 DIAGNOSIS — J40 Bronchitis, not specified as acute or chronic: Secondary | ICD-10-CM | POA: Diagnosis not present

## 2020-10-19 DIAGNOSIS — Z79899 Other long term (current) drug therapy: Secondary | ICD-10-CM | POA: Diagnosis not present

## 2020-10-19 DIAGNOSIS — Z87891 Personal history of nicotine dependence: Secondary | ICD-10-CM | POA: Diagnosis not present

## 2020-11-01 DIAGNOSIS — M25561 Pain in right knee: Secondary | ICD-10-CM | POA: Diagnosis not present

## 2020-11-01 DIAGNOSIS — N2581 Secondary hyperparathyroidism of renal origin: Secondary | ICD-10-CM | POA: Diagnosis not present

## 2020-11-01 DIAGNOSIS — N390 Urinary tract infection, site not specified: Secondary | ICD-10-CM | POA: Diagnosis not present

## 2020-11-01 DIAGNOSIS — N182 Chronic kidney disease, stage 2 (mild): Secondary | ICD-10-CM | POA: Diagnosis not present

## 2020-11-02 ENCOUNTER — Other Ambulatory Visit: Payer: Self-pay

## 2020-11-02 DIAGNOSIS — N2581 Secondary hyperparathyroidism of renal origin: Secondary | ICD-10-CM | POA: Diagnosis not present

## 2020-11-02 DIAGNOSIS — D631 Anemia in chronic kidney disease: Secondary | ICD-10-CM | POA: Diagnosis not present

## 2020-11-02 DIAGNOSIS — I129 Hypertensive chronic kidney disease with stage 1 through stage 4 chronic kidney disease, or unspecified chronic kidney disease: Secondary | ICD-10-CM | POA: Diagnosis not present

## 2020-11-02 DIAGNOSIS — N182 Chronic kidney disease, stage 2 (mild): Secondary | ICD-10-CM | POA: Diagnosis not present

## 2020-11-02 MED ORDER — QNASL 80 MCG/ACT NA AERS: 2.0000 | INHALATION_SPRAY | Freq: Every day | NASAL | 0 refills | Status: DC

## 2020-11-03 ENCOUNTER — Other Ambulatory Visit: Payer: Self-pay | Admitting: Nephrology

## 2020-11-03 DIAGNOSIS — N182 Chronic kidney disease, stage 2 (mild): Secondary | ICD-10-CM

## 2020-11-04 ENCOUNTER — Ambulatory Visit (INDEPENDENT_AMBULATORY_CARE_PROVIDER_SITE_OTHER): Payer: Medicare Other | Admitting: Adult Health

## 2020-11-04 ENCOUNTER — Other Ambulatory Visit: Payer: Self-pay

## 2020-11-04 ENCOUNTER — Encounter: Payer: Self-pay | Admitting: Adult Health

## 2020-11-04 VITALS — BP 115/75 | HR 79 | Ht 70.0 in | Wt 229.0 lb

## 2020-11-04 DIAGNOSIS — G43009 Migraine without aura, not intractable, without status migrainosus: Secondary | ICD-10-CM | POA: Diagnosis not present

## 2020-11-04 DIAGNOSIS — G4700 Insomnia, unspecified: Secondary | ICD-10-CM | POA: Diagnosis not present

## 2020-11-04 DIAGNOSIS — B20 Human immunodeficiency virus [HIV] disease: Secondary | ICD-10-CM

## 2020-11-04 DIAGNOSIS — G63 Polyneuropathy in diseases classified elsewhere: Secondary | ICD-10-CM | POA: Diagnosis not present

## 2020-11-04 MED ORDER — UBRELVY 50 MG PO TABS: ORAL_TABLET | ORAL | 3 refills | Status: DC

## 2020-11-04 NOTE — Progress Notes (Signed)
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  He returns today for follow-up.  He recently had ACDF 3 level with Dr. Lynann Bologna.  He reports that he has had several headaches since the surgery.  Today is the first day he has not had a headache.  Reports that triptans make him sick.  He was recently on sumatriptan.  In the past he has been on amitriptyline and nortriptyline for headaches.  He is currently not on any preventative therapy.  He remains on gabapentin 300 mg 4 times a day for neuropathy.  He currently takes Xanax nightly to help him sleep.  He used to be on Ambien but is no longer taking this.  He states that he would like to alternate between Ambien and Xanax.   HISTORY  05/04/20 - I have the pleasure of meeting again with Casey Brennan , meanwhile 60 years of age. I have followed Casey Brennan since 2012, by now 9 years, after initially being referred by Casey Filbert C. [name], MD.  The patient has been retroviral primary neuropathy but he has also presented with headaches intermittently and now feels that numbness and tingling in his feet are progressing for at least a couple of weeks again.  His sister also mentions that he has been very bluish toes.  The patient is a former smoker but quit about 10 years ago.  In his last visit he was taking Ambien 5 mg Xanax 0.5 mg for sleep and he continued to take gabapentin 300 mg 4 times a day for neuropathic pain.  Zanaflex also helps with abortive therapy as well as Imitrex for migraines. He is still gainfully employed as a Administrator, has no problems with his handling the paddles, he had no falls.     REVIEW OF SYSTEMS: Out of a complete 14 system review of symptoms, the patient complains only of the following symptoms, and all other reviewed systems are  negative.  See HPI  ALLERGIES: Allergies  Allergen Reactions  . Chocolate Shortness Of Breath and Other (See Comments)    Asthma attack, welts  . Descovy [Emtricitabine-Tenofovir Af] Shortness Of Breath    Nasuea, vomiiting, diarrhea, sob, whelps, throat closes  . Genvoya [Elviteg-Cobic-Emtricit-Tenofaf] Shortness Of Breath    Nausea, vomitting, diarreha, sob, rash, throat closing  . Morphine And Related Shortness Of Breath, Itching and Other (See Comments)     chest pain  . Penicillins Shortness Of Breath and Rash    Has patient had a PCN reaction causing immediate rash, facial/tongue/throat swelling, SOB or lightheadedness with hypotension: Yes Has patient had a PCN reaction causing severe rash involving mucus membranes or skin necrosis: No Has patient had a PCN reaction that required hospitalization No Has patient had a PCN reaction occurring within the last 10 years: Yes If all of the above answers are "NO", then may proceed with Cephalosporin use.  . Sulfa Antibiotics Anaphylaxis  . Tomato Shortness Of Breath  . Vancomycin Shortness Of Breath  . Carvedilol     LEG SWELLING, DIZZNESS  . Flagyl [Metronidazole] Itching  . Milk-Related Compounds   . Toradol [Ketorolac Tromethamine] Nausea And Vomiting    itching  . Tramadol Itching and Nausea And Vomiting    HOME MEDICATIONS: Outpatient Medications Prior to Visit  Medication Sig Dispense Refill  . albuterol (  PROVENTIL) (2.5 MG/3ML) 0.083% nebulizer solution Take 3 mLs (2.5 mg total) by nebulization every 6 (six) hours as needed for wheezing or shortness of breath. 75 mL 3  . albuterol (VENTOLIN HFA) 108 (90 Base) MCG/ACT inhaler INHALE 2 PUFFS INTO THE LUNGS EVERY 6 HOURS AS NEEDED FOR WHEEZING OR SHORTNESS OF BREATH (Patient taking differently: Inhale 2 puffs into the lungs every 6 (six) hours as needed for wheezing or shortness of breath. INHALE 2 PUFFS INTO THE LUNGS EVERY 6 HOURS AS NEEDED FOR WHEEZING OR SHORTNESS OF  BREATH) 36 g 3  . ALPRAZolam (XANAX) 0.5 MG tablet Take 1 tablet (0.5 mg total) by mouth 2 (two) times daily as needed for sleep. 180 tablet 0  . aspirin EC 81 MG tablet Take 81 mg by mouth every Monday, Wednesday, and Friday.     . Beclomethasone Dipropionate (QNASL) 80 MCG/ACT AERS Place 2 sprays into the nose daily. 10.6 g 0  . Budeson-Glycopyrrol-Formoterol (BREZTRI AEROSPHERE) 160-9-4.8 MCG/ACT AERO Inhale 2 puffs into the lungs in the morning and at bedtime. 5.9 g 0  . cetirizine (ZYRTEC) 10 MG tablet Take 1 tablet (10 mg total) by mouth daily. 90 tablet 3  . cyclobenzaprine (FLEXERIL) 5 MG tablet Take 5 mg by mouth 2 (two) times daily.    . diclofenac Sodium (VOLTAREN) 1 % GEL APPLY 2 GRAMS EXTERNALLY TO THE AFFECTED AREA TWICE DAILY AS NEEDED FOR PAIN (Patient taking differently: Apply 2 g topically 2 (two) times daily as needed (pain). APPLY 2 GRAMS EXTERNALLY TO THE AFFECTED AREA TWICE DAILY AS NEEDED FOR PAIN) 100 g 0  . EPINEPHrine 0.3 mg/0.3 mL IJ SOAJ injection Inject 0.3 mg into the muscle as needed for anaphylaxis. 1 each 5  . gabapentin (NEURONTIN) 300 MG capsule Take 1 capsule (300 mg total) by mouth 4 (four) times daily as needed (pain). 120 capsule 11  . Glucos-Chond-Hyal Ac-Ca Fructo (MOVE FREE JOINT HEALTH ADVANCE PO) Take 1 tablet by mouth daily.    . hydrOXYzine (ATARAX/VISTARIL) 25 MG tablet Take 1 tablet (25 mg total) by mouth at bedtime. 90 tablet 3  . ipratropium (ATROVENT) 0.02 % nebulizer solution USE 2.5 ML(0.5 MG) VIA NEBULIZER THREE TIMES DAILY AS NEEDED FOR WHEEZING OR SHORTNESS OF BREATH (Patient taking differently: Take 0.5 mg by nebulization 3 (three) times daily as needed for wheezing or shortness of breath.) 1650 mL 1  . ISENTRESS 400 MG tablet TAKE 2 TABLETS(800 MG) BY MOUTH AT BEDTIME (Patient taking differently: Take 800 mg by mouth at bedtime. TAKE 2 TABLETS(800 MG) BY MOUTH AT BEDTIME) 60 tablet 5  . LYRICA 75 MG capsule Take 75 mg by mouth 2 (two) times  daily.    . metoprolol succinate (TOPROL-XL) 100 MG 24 hr tablet Take 1 tablet (100 mg total) by mouth daily. 90 tablet 3  . mometasone-formoterol (DULERA) 200-5 MCG/ACT AERO INHALE 2 PUFFS BY MOUTH EVERY MORNING AND 2 PUFFS 12 HOURS LATER (Patient taking differently: Inhale 2 puffs into the lungs in the morning and at bedtime. INHALE 2 PUFFS BY MOUTH EVERY MORNING AND 2 PUFFS 12 HOURS LATER) 13 g 0  . Multiple Vitamin (MULTIVITAMIN WITH MINERALS) TABS tablet Take 1 tablet by mouth daily after breakfast.     . mupirocin ointment (BACTROBAN) 2 % APPLY EXTERNALLY TO THE AFFECTED AREA TWICE DAILY FOR 3 WEEKS, PLACE COTTON BETWEEN TOES TO ALLOW BETTER AERATION AS NEEDED 66 g 1  . omeprazole (PRILOSEC) 40 MG capsule Take 40 mg by mouth daily.    [name]  OVER THE COUNTER MEDICATION Take 2 capsules by mouth daily. Zhou: thyroid supplement    . oxyCODONE-acetaminophen (PERCOCET/ROXICET) 5-325 MG tablet Take 1-2 tablets by mouth every 4 (four) hours as needed for moderate pain or severe pain. 30 tablet 0  . Polyethyl Glycol-Propyl Glycol (SYSTANE) 0.4-0.3 % GEL ophthalmic gel Place 1 application into both eyes 2 (two) times daily.     . SUMAtriptan (IMITREX) 100 MG tablet TAKE 1 TABLET BY MOUTH EVERY 2 HOURS AS NEEDED FOR MIGRAINE, MAY REPEAT IN 2 HOURS IF HEADACHE PERSISTS OR RECURS (Patient taking differently: Take 100 mg by mouth every 2 (two) hours as needed for migraine. TAKE 1 TABLET BY MOUTH EVERY 2 HOURS AS NEEDED FOR MIGRAINE, MAY REPEAT IN 2 HOURS IF HEADACHE PERSISTS OR RECURS) 10 tablet 2  . tizanidine (ZANAFLEX) 6 MG capsule Take 1 capsule (6 mg total) by mouth 3 (three) times daily. 90 capsule 11  . torsemide (DEMADEX) 20 MG tablet Take 10 mg by mouth See admin instructions. Tues Thurs and Sat    . TRUVADA 200-300 MG tablet Take 1 tablet by mouth daily. 30 tablet 5   No facility-administered medications prior to visit.    PAST MEDICAL HISTORY: Past Medical History:  Diagnosis Date  . Arthritis     r knee   . Asthma    very rare  . Axillary lymphadenopathy   . Bronchitis   . Chronic anemia    normocytic  . Chronic folliculitis   . Elevated PSA   . Environmental and seasonal allergies   . GERD (gastroesophageal reflux disease)    Patient denies  . Gross hematuria   . H/O pericarditis    2010--  myopercarditis--  resolved  . HCAP (healthcare-associated pneumonia) 11/19/2014  . Headache(784.0)    HX SEVERE FRONTAL HA'S  . Hiatal hernia   . History of concussion    2012  &  2013  RESIDUAL HA'S --  RESOLVED  . History of gastric ulcer   . History of kidney stones   . History of MRSA infection 2010   infected boil  . HIV (human immunodeficiency virus infection) ([place]) 1988  . Hypertension   . Internal hemorrhoids   . Lytic bone lesion of hip    WORK-UP DONE BY ONCOLOGIST DR HA --  NOT MALIGNANT  . 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 Pneumonia    hx of  . Post concussion syndrome    resolved  . Prostate cancer ([place]) 04/25/13   gleason 3+3=6, volume 30 gm  . Sinusitis, chronic 05/14/2015  . Ulcer    hx of gastric  . Wears glasses     PAST SURGICAL HISTORY: Past Surgical History:  Procedure Laterality Date  . ANTERIOR CERVICAL DECOMPRESSION/DISCECTOMY FUSION 4 LEVELS N/A 09/29/2020   Procedure: ANTERIOR CERVICAL DECOMPRESSION FUSION CERVICAL 3-CERVICAL 4 , CERVICAL 4 - CERVICAL 5, CERVICAL 5 - CERVICAL 6 WITH INSTRUMENTATION AND ALLOGRAFT;  Surgeon: Phylliss Bob, MD;  Location: [facility];  Service: Orthopedics;  Laterality: N/A;  . CARDIAC CATHETERIZATION  03-23-2009  DR COOPER   NORMAL CORONARY ARTERIES  . CHOLECYSTECTOMY N/A 11/06/2014   Procedure: LAPAROSCOPIC CHOLECYSTECTOMY WITH INTRAOPERATIVE CHOLANGIOGRAM;  Surgeon: Judeth Horn, MD;  Location: [facility];  Service: General;  Laterality: N/A;  . COLONOSCOPY  12/26/2011   Procedure: COLONOSCOPY;  Surgeon: Lear Ng, MD;  Location: [facility];  Service: Endoscopy;  Laterality: N/A;  . COLONOSCOPY    .  COLONOSCOPY WITH PROPOFOL N/A 07/29/2014   Procedure:  COLONOSCOPY WITH PROPOFOL;  Surgeon: Lear Ng, MD;  Location: [facility];  Service: Endoscopy;  Laterality: N/A;  . CYSTOSCOPY N/A 05/18/2018   Procedure: CYSTOSCOPY REMOVAL FOREIGN BODY;  Surgeon: Kathie Rhodes, MD;  Location: [facility];  Service: Urology;  Laterality: N/A;  . CYSTOSCOPY/RETROGRADE/URETEROSCOPY Bilateral 04/25/2013   Procedure: CYSTOSCOPY/ BILATERAL RETROGRADES; BLADDER BIOPSIES;  Surgeon: Alexis Frock, MD;  Location: [facility];  Service: Urology;  Laterality: Bilateral;  . DENTAL EXAMINATION UNDER ANESTHESIA    . ESOPHAGOGASTRODUODENOSCOPY  12/26/2011   Procedure: ESOPHAGOGASTRODUODENOSCOPY (EGD);  Surgeon: Lear Ng, MD;  Location: [facility];  Service: Endoscopy;  Laterality: N/A;  . ESOPHAGOGASTRODUODENOSCOPY (EGD) WITH PROPOFOL N/A 07/29/2014   Procedure: ESOPHAGOGASTRODUODENOSCOPY (EGD) WITH PROPOFOL;  Surgeon: Lear Ng, MD;  Location: [place];  Service: Endoscopy;  Laterality: N/A;  . EXCISION CHRONIC LEFT BREAST ABSCESS  09-21-2010  . EXCISIONAL BX LEFT BREAST MASS/  I  &  D LEFT BREAST ABSCESS  03-24-2009  . KNEE ARTHROSCOPY Right 1985  . left axilla biopsy  04/2013  . left hip biopsy  10/2012  . LYMPHADENECTOMY Bilateral 08/18/2013   Procedure: LYMPHADENECTOMY "PELVIC LYMPH NODE DISSECTION";  Surgeon: Alexis Frock, MD;  Location: [facility];  Service: Urology;  Laterality: Bilateral;  . PROSTATE BIOPSY N/A 04/25/2013   Procedure: BIOPSY TRANSRECTAL ULTRASONIC PROSTATE (TUBP);  Surgeon: Alexis Frock, MD;  Location: [facility];  Service: Urology;  Laterality: N/A;  . ROBOT ASSISTED LAPAROSCOPIC RADICAL PROSTATECTOMY N/A 08/18/2013   Procedure: ROBOTIC ASSISTED LAPAROSCOPIC RADICAL PROSTATECTOMY;  Surgeon: Alexis Frock, MD;  Location: [facility];  Service: Urology;  Laterality: N/A;  . TOTAL KNEE ARTHROPLASTY Right 08/01/2016   08/17/16  . TOTAL KNEE  ARTHROPLASTY Right 08/01/2016   Procedure: TOTAL KNEE ARTHROPLASTY;  Surgeon: Renette Butters, MD;  Location: [place];  Service: Orthopedics;  Laterality: Right;  . UPPER GASTROINTESTINAL ENDOSCOPY    . UPPER LEG SOFT TISSUE BIOPSY Left 2012   lthigh    FAMILY HISTORY: Family History  Problem Relation Age of Onset  . Stroke Father   . Diabetes Father   . Cancer Father        brain cancer  . Asthma Father   . Hypertension Sister   . Cancer Maternal Uncle        prostate cancer  . Asthma Sister   . Colon cancer Neg Hx   . Colon polyps Neg Hx   . Esophageal cancer Neg Hx   . Stomach cancer Neg Hx   . Rectal cancer Neg Hx     SOCIAL HISTORY: Social History   Socioeconomic History  . Marital status: Divorced    Spouse name: Not on file  . Number of children: 1  . Years of education: College  . Highest education level: Not on file  Occupational History  . 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 Occupation: disability  Tobacco Use  . Smoking status: Former Smoker    Packs/day: 1.50    Years: 23.00    Pack years: 34.50    Types: Cigars, Cigarettes    Quit date: 05/22/2010    Years since quitting: 10.4  . Smokeless tobacco: Never Used  Vaping Use  . Vaping Use: Never used  Substance and Sexual Activity  . Alcohol use: Not Currently    Alcohol/week: 0.0 standard drinks    Comment: 1-2 drinks a week   . Drug use: No  . Sexual activity:  Not Currently    Birth control/protection: Condom    Comment: pt. declined condoms 03/11/20  Other Topics Concern  . Not on file  Social History Narrative   Sister April stays with him currently although he hopes to return to truck driving. 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 Sister is starting medical school next year.   Patient is divorced and lives with his sister.   Patient has one adult son.   Patient is part-time, driving a truck.   Patient has a college education.   Patient is  right-handed.   Patient drinks 2-3 cups of coffee and a 4-5 cups of tea.   Social Determinants of Health   Financial Resource Strain: Not on file  Food Insecurity: Not on file  Transportation Needs: Not on file  Physical Activity: Not on file  Stress: Not on file  Social Connections: Not on file  Intimate Partner Violence: Not on file      PHYSICAL EXAM  Vitals:   11/04/20 1348  BP: 115/75  Pulse: 79  Weight: 229 lb (103.9 kg)  Height: 5\' 10"  (1.778 m)   Body mass index is 32.86 kg/m.  Generalized: Well developed, in no acute distress   Neurological examination  Mentation: Alert oriented to time, place, history taking. Follows all commands speech and language fluent Cranial nerve II-XII: Pupils were equal round reactive to light. Extraocular movements were full, visual field were full on confrontational test. Facial sensation and strength were normal. Uvula tongue midline. Head turning and shoulder shrug  were normal and symmetric. Motor: The motor testing reveals 5 over 5 strength of all 4 extremities. Good symmetric motor tone is noted throughout.  Sensory: Sensory testing is intact to soft touch on all 4 extremities. No evidence of extinction is noted.  Coordination: Cerebellar testing reveals good finger-nose-finger and heel-to-shin bilaterally.  Gait and station: Gait is cautious.  Slightly wide-based.  Able to stand from a sitting position Reflexes: Deep tendon reflexes are symmetric and normal bilaterally.   DIAGNOSTIC DATA (LABS, IMAGING, TESTING) - I reviewed patient records, labs, notes, testing and imaging myself where available.  Lab Results  Component Value Date   WBC 4.9 09/27/2020   HGB 13.7 09/27/2020   HCT 41.8 09/27/2020   MCV 95.7 09/27/2020   PLT 162 09/27/2020      Component Value Date/Time   NA 139 09/27/2020 1630   NA 142 02/24/2013 0848   K 3.9 09/27/2020 1630   K 3.4 (L) 02/24/2013 0848   CL 108 09/27/2020 1630   CO2 24 09/27/2020 1630    CO2 24 02/24/2013 0848   GLUCOSE 95 09/27/2020 1630   GLUCOSE 121 02/24/2013 0848   GLUCOSE 93 03/25/2009 0000   BUN 13 09/27/2020 1630   BUN 11.9 02/24/2013 0848   CREATININE 1.47 (H) 09/27/2020 1630   CREATININE 1.19 03/11/2020 1540   CREATININE 1.3 02/24/2013 0848   CALCIUM 9.1 09/27/2020 1630   CALCIUM 8.9 02/24/2013 0848   PROT 6.7 09/27/2020 1630   PROT 7.1 02/24/2013 0848   ALBUMIN 3.8 09/27/2020 1630   ALBUMIN 3.6 02/24/2013 0848   AST 22 09/27/2020 1630   AST 14 02/24/2013 0848   ALT 20 09/27/2020 1630   ALT 11 02/24/2013 0848   ALKPHOS 94 09/27/2020 1630   ALKPHOS 94 02/24/2013 0848   BILITOT 0.7 09/27/2020 1630   BILITOT 1.40 (H) 02/24/2013 0848   GFRNONAA 55 (L) 09/27/2020 1630   GFRNONAA 70 12/04/2018 1543   GFRAA >60  05/08/2019 0504   GFRAA 82 12/04/2018 1543   Lab Results  Component Value Date   CHOL 163 05/25/2020   HDL 45 05/25/2020   LDLCALC 97 05/25/2020   TRIG 114 05/25/2020   CHOLHDL 3.6 05/25/2020   Lab Results  Component Value Date   HGBA1C (L) 10/10/2010    4.3 (NOTE)                                                                       According to the ADA Clinical Practice Recommendations for 2011, when HbA1c is used as a screening test:   >=6.5%   Diagnostic of Diabetes Mellitus           (if abnormal result  is confirmed)  5.7-6.4%   Increased risk of developing Diabetes Mellitus  References:Diagnosis and Classification of Diabetes Mellitus,Diabetes TFTD,3220,25(KYHCW 1):S62-S69 and Standards of Medical Care in         Diabetes - 2011,Diabetes Care,2011,34  (Suppl 1):S11-S61.   Lab Results  Component Value Date   VITAMINB12 >2000 (H) 10/25/2015   Lab Results  Component Value Date   TSH 3.29 05/20/2020      ASSESSMENT AND PLAN 60 y.o. 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 here with:  1.  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  Migraine headaches   3.  Insomnia  -Continue gabapentin 300 mg 4 times a day -Continue tizanidine 6 mg 3 times a day -Stop sumatriptan  -Start Ubrelvy 50 mg take 1 tablet at the onset of a migraine can repeat in 2 hours if needed -Continue Xanax 0.5 mg As needed for sleep -Patient is requesting to restart Ambien however it appears Dr. Brett Fairy discontinue this.  Advised that I would discuss with her when she is back in the office     Ward Givens, MSN, NP-C 11/04/2020, 1:46 PM [facility] [address], [place], [place] [address] [phone]

## 2020-11-04 NOTE — Patient Instructions (Signed)
Your Plan:  Continue Tizandine and gabapentin Try Ubrelvy 50 mg at the onset of migraine repeat in 2 hours if needed. If your symptoms worsen or you develop new symptoms please let us know.   Thank you for coming to see [place] at [facility]. I hope we have been able to provide you high quality care today.  You may receive a patient satisfaction survey over the next few weeks. We would appreciate your feedback and comments so that we may continue to improve ourselves and the health of our patients.  Ubrogepant tablets What is this medicine? UBROGEPANT (ue BROE je pant) is used to treat migraine headaches with or without aura. An aura is a strange feeling or visual disturbance that warns you of an attack. It is not used to prevent migraines. This medicine may be used for other purposes; ask your health care provider or pharmacist if you have questions. COMMON BRAND NAME(S): Roselyn Meier What should I tell my health care provider before I take this medicine? They need to know if you have any of these conditions:  kidney disease  liver disease  an unusual or allergic reaction to ubrogepant, other medicines, foods, dyes, or preservatives  pregnant or trying to get pregnant  breast-feeding How should I use this medicine? Take this medicine by mouth with a glass of water. Follow the directions on the prescription label. You can take it with or without food. If it upsets your stomach, take it with food. Take your medicine at regular intervals. Do not take it more often than directed. Do not stop taking except on your doctor's advice. Talk to your pediatrician about the use of this medicine in children. Special care may be needed. Overdosage: If you think you have taken too much of this medicine contact a poison control center or emergency room at once. NOTE: This medicine is only for you. Do not share this medicine with others. What if I miss a dose? This does not apply. This  medicine is not for regular use. What may interact with this medicine? Do not take this medicine with any of the following medicines:  ceritinib  certain antibiotics like chloramphenicol, clarithromycin, telithromycin  certain antivirals for HIV like atazanavir, cobicistat, darunavir, delavirdine, fosamprenavir, indinavir, ritonavir  certain medicines for fungal infections like itraconazole, ketoconazole, posaconazole, voriconazole  conivaptan  grapefruit  idelalisib  mifepristone  nefazodone  ribociclib This medicine may also interact with the following medications:  carvedilol  certain medicines for seizures like phenobarbital, phenytoin  ciprofloxacin  cyclosporine  eltrombopag  fluconazole  fluvoxamine  quinidine  rifampin  St. [name]'s wort  verapamil This list may not describe all possible interactions. Give your health care provider a list of all the medicines, herbs, non-prescription drugs, or dietary supplements you use. Also tell them if you smoke, drink alcohol, or use illegal drugs. Some items may interact with your medicine. What should I watch for while using this medicine? Visit your health care professional for regular checks on your progress. Tell your health care professional if your symptoms do not start to get better or if they get worse. Your mouth may get dry. Chewing sugarless gum or sucking hard candy and drinking plenty of water may help. Contact your health care professional if the problem does not go away or is severe. What side effects may I notice from receiving this medicine? Side effects that you should report to your doctor or health care professional as soon as possible:  allergic reactions like  skin rash, itching or hives; swelling of the face, lips, or tongue Side effects that usually do not require medical attention (report these to your doctor or health care professional if they continue or are bothersome):  drowsiness  dry  mouth  nausea  tiredness This list may not describe all possible side effects. Call your doctor for medical advice about side effects. You may report side effects to FDA at 1-800-FDA-1088. Where should I keep my medicine? Keep out of the reach of children. Store at room temperature between 15 and 30 degrees C (59 and 86 degrees F). Throw away any unused medicine after the expiration date. NOTE: This sheet is a summary. It may not cover all possible information. If you have questions about this medicine, talk to your doctor, pharmacist, or health care provider.  2021 Elsevier/Gold Standard (2018-09-05 08:50:55)

## 2020-11-08 ENCOUNTER — Other Ambulatory Visit: Payer: Self-pay | Admitting: Infectious Diseases

## 2020-11-08 ENCOUNTER — Encounter: Payer: Self-pay | Admitting: Adult Health

## 2020-11-08 ENCOUNTER — Other Ambulatory Visit: Payer: Self-pay | Admitting: Family

## 2020-11-08 DIAGNOSIS — B20 Human immunodeficiency virus [HIV] disease: Secondary | ICD-10-CM

## 2020-11-08 NOTE — Telephone Encounter (Signed)
PATIENT SCHEDULED FOR 11/11/20

## 2020-11-08 NOTE — Telephone Encounter (Signed)
PATIENT SCHEDULED 11/11/20

## 2020-11-09 ENCOUNTER — Ambulatory Visit
Admission: RE | Admit: 2020-11-09 | Discharge: 2020-11-09 | Disposition: A | Discharge status: Home / Self Care | Payer: Medicare Other | Source: Ambulatory Visit | Attending: Nephrology | Admitting: Nephrology

## 2020-11-09 DIAGNOSIS — N182 Chronic kidney disease, stage 2 (mild): Secondary | ICD-10-CM | POA: Diagnosis not present

## 2020-11-09 DIAGNOSIS — N281 Cyst of kidney, acquired: Secondary | ICD-10-CM | POA: Diagnosis not present

## 2020-11-09 NOTE — Telephone Encounter (Signed)
PA initiated [place] for ubrelvy. Approved [id number] thru 07-02-21 [facility] [phone].

## 2020-11-11 ENCOUNTER — Encounter: Payer: Self-pay | Admitting: Internal Medicine

## 2020-11-11 ENCOUNTER — Ambulatory Visit: Payer: Medicare Other | Admitting: Cardiology

## 2020-11-11 ENCOUNTER — Other Ambulatory Visit: Payer: Self-pay | Admitting: Pharmacist

## 2020-11-11 ENCOUNTER — Ambulatory Visit (INDEPENDENT_AMBULATORY_CARE_PROVIDER_SITE_OTHER): Payer: Medicare Other | Admitting: Infectious Diseases

## 2020-11-11 ENCOUNTER — Encounter: Payer: Self-pay | Admitting: Cardiology

## 2020-11-11 ENCOUNTER — Encounter: Payer: Self-pay | Admitting: Infectious Diseases

## 2020-11-11 ENCOUNTER — Other Ambulatory Visit: Payer: Self-pay

## 2020-11-11 VITALS — BP 134/101 | HR 96 | Temp 98.2°F | Resp 17 | Ht 70.0 in | Wt 232.0 lb

## 2020-11-11 VITALS — BP 148/98 | HR 88 | Wt 232.0 lb

## 2020-11-11 DIAGNOSIS — Z23 Encounter for immunization: Secondary | ICD-10-CM | POA: Diagnosis not present

## 2020-11-11 DIAGNOSIS — D229 Melanocytic nevi, unspecified: Secondary | ICD-10-CM | POA: Insufficient documentation

## 2020-11-11 DIAGNOSIS — Z981 Arthrodesis status: Secondary | ICD-10-CM | POA: Insufficient documentation

## 2020-11-11 DIAGNOSIS — Z79899 Other long term (current) drug therapy: Secondary | ICD-10-CM

## 2020-11-11 DIAGNOSIS — I10 Essential (primary) hypertension: Secondary | ICD-10-CM

## 2020-11-11 DIAGNOSIS — B20 Human immunodeficiency virus [HIV] disease: Secondary | ICD-10-CM

## 2020-11-11 DIAGNOSIS — Z113 Encounter for screening for infections with a predominantly sexual mode of transmission: Secondary | ICD-10-CM | POA: Diagnosis not present

## 2020-11-11 DIAGNOSIS — N182 Chronic kidney disease, stage 2 (mild): Secondary | ICD-10-CM

## 2020-11-11 DIAGNOSIS — J449 Chronic obstructive pulmonary disease, unspecified: Secondary | ICD-10-CM | POA: Diagnosis not present

## 2020-11-11 MED ORDER — BIKTARVY 50-200-25 MG PO TABS: 1.0000 | ORAL_TABLET | Freq: Every day | ORAL | 11 refills | Status: DC

## 2020-11-11 NOTE — Progress Notes (Signed)
Subjective:    Patient ID: Casey Brennan, male  DOB: [birth year], 60 y.o.        MRN: [id number]   HPI 60yo M with hx HIV+, COPD Gold,and prostate Ca.  Had radical prostatectomy 08-18-13, adenoCA, LN (-), margins (-).  Had R knee arthroscopy 1985,prevMRIand has severe osteoarthritis. Had R TKR 08-2016 Is on isentress/darunavir/norvir/descovy. Waschangedto genvoya, prezista [phone]).He developed a rash from the sulfa in the darunavir, changed back to his prev rx.   He is on issentress/truvada now alone.  ? Allergy to descovey (more likely darunavir). .  He has been seen by our excellent NP for his last 4 visits.   Has gotten COVID vax.   He had (08-2020): 60yo M with hx HIV+, COPD Gold,and prostate Ca.  Had radical prostatectomy 08-18-13, adenoCA, LN (-), margins (-).  Had R knee arthroscopy 1985,prevMRIand has severe osteoarthritis.  Is on isentress/darunavir/norvir/descovy. Waschangedto genvoya, prezista [phone]).He developed a rash from the sulfa in the darunavir, changed back to his prev rx.  Had R TKR 08-2016 He is on issentress/truvada now alone.  ? Allergy to descovey (more likely darunavir). .   Has gotten COVID vax.  09-29-20: ANTERIOR CERVICAL DECOMPRESSION FUSION CERVICAL 3-CERVICAL 4 , CERVICAL 4 - CERVICAL 5, CERVICAL 5 - CERVICAL 6 WITH INSTRUMENTATION AND ALLOGRAFT Goes back to surgery 11-12-20 for clearance to go back to work. His arm pain has since resolved.   HIV 1 RNA Quant  Date Value  03/11/2020 <20 Copies/mL  07/07/2019 <20 NOT DETECTED copies/mL  12/04/2018 <20 NOT DETECTED copies/mL   CD4 T Cell Abs (/uL)  Date Value  03/11/2020 479  07/07/2019 580  12/04/2018 525     Health Maintenance  Topic Date Due  . COVID-19 Vaccine (4 - Booster for Pfizer series) 08/25/2020  . INFLUENZA VACCINE  01/31/2021  . TETANUS/TDAP  06/11/2021  . COLONOSCOPY (Pts 45-32yrs Insurance coverage will need to be confirmed)  12/25/2028  . Hepatitis C  Screening  Completed  . HIV Screening  Completed  . HPV VACCINES  Aged Out   Still taking some percocet 5/325 for pain.    Review of Systems  Constitutional: Negative for chills and fever.  Respiratory: Negative for shortness of breath.   Gastrointestinal: Negative for constipation and diarrhea.  Genitourinary: Negative for dysuria.  Musculoskeletal: Positive for myalgias and neck pain.  Neurological: Positive for headaches.   Some trouble with swallowing. Taking pills with apple sauce.  Please see HPI. All other systems reviewed and negative.     Objective:  Physical Exam Vitals reviewed.  Constitutional:      General: He is not in acute distress.    Appearance: He is not ill-appearing.  HENT:     Mouth/Throat:     Mouth: Mucous membranes are moist.     Pharynx: No oropharyngeal exudate.  Eyes:     Extraocular Movements: Extraocular movements intact.     Pupils: Pupils are equal, round, and reactive to light.  Neck:   Cardiovascular:     Rate and Rhythm: Normal rate and regular rhythm.  Pulmonary:     Effort: Pulmonary effort is normal.     Breath sounds: Normal breath sounds.  Abdominal:     General: Bowel sounds are normal. There is no distension.     Palpations: Abdomen is soft.     Tenderness: There is no abdominal tenderness.    Musculoskeletal:        General: Normal range of motion.  Cervical back: Normal range of motion and neck supple.     Right lower leg: No edema.     Left lower leg: No edema.  Neurological:     General: No focal deficit present.     Mental Status: He is alert.  Psychiatric:        Mood and Affect: Mood normal.            Assessment & Plan:

## 2020-11-11 NOTE — Assessment & Plan Note (Signed)
Firm nodule on L triceps. Regular border.  Will send to derm.

## 2020-11-11 NOTE — Assessment & Plan Note (Signed)
He is doing ok, still some wheezing.  He has had f/u with pulmonary.

## 2020-11-11 NOTE — Assessment & Plan Note (Signed)
Appreciate Dr Dumonski's f/u.  He appears to be doing well.

## 2020-11-11 NOTE — Assessment & Plan Note (Addendum)
Renal U/s 10-2020  1. Renal echogenicity within normal limits. No hydronephrosis. 2. Multiple benign appearing renal cysts as above, largest of which measures 3.4 cm on the right.  His Cr is stable.  Appreciate Dr patel's f/u.

## 2020-11-11 NOTE — Progress Notes (Signed)
Patient referred by Biagio Borg, MD for hypertension  Subjective:   Casey Brennan, male    DOB: 05-Dec-1960, 60 y.o.   MRN: [id number]   Chief Complaint  Patient presents with  . Hypertension    3 month    60 year old male with hypertension, hyperlipidemia, HIV.  Blood pressure is elevated today. He has had headaches for which he is seeing Dr Brett Fairy. He recently underwent sinal surgery, currently wears a collar.   Initial consultation HPI 05/2020: Patient was recently seen by [facility] Dr. Eleonore Chiquito.  Prior to that, he was also seen by Drs. Harwani and [place].  His prior cardiac work-up showed coronary CTA with normal coronary arteries and calcium score 0.  He has not tolerated several antihypertensive medications in the past, including amlodipine, nifedipine, HCTZ, losartan, carvedilol. He is tolerating metoprolol succinate.  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 He uses Aspirin 325 mg on M,W,F during his trips.     Current Outpatient Medications on File Prior to Visit  Medication Sig Dispense Refill  . albuterol (PROVENTIL) (2.5 MG/3ML) 0.083% nebulizer solution Take 3 mLs (2.5 mg total) by nebulization every 6 (six) hours as needed for wheezing or shortness of breath. 75 mL 3  . albuterol (VENTOLIN HFA) 108 (90 Base) MCG/ACT inhaler INHALE 2 PUFFS INTO THE LUNGS EVERY 6 HOURS AS NEEDED FOR WHEEZING OR SHORTNESS OF BREATH (Patient taking differently: Inhale 2 puffs into the lungs every 6 (six) hours as needed for wheezing or shortness of breath. INHALE 2 PUFFS INTO THE LUNGS EVERY 6 HOURS AS NEEDED FOR WHEEZING OR SHORTNESS OF BREATH) 36 g 3  . ALPRAZolam (XANAX) 0.5 MG tablet Take 1 tablet (0.5 mg total) by mouth 2 (two) times daily as needed for sleep. 180 tablet 0  . aspirin EC 81 MG tablet Take 81 mg by mouth every Monday, Wednesday, and Friday.     . Beclomethasone Dipropionate (QNASL) 80 MCG/ACT AERS Place 2 sprays into  the nose daily. 10.6 g 0  . Budeson-Glycopyrrol-Formoterol (BREZTRI AEROSPHERE) 160-9-4.8 MCG/ACT AERO Inhale 2 puffs into the lungs in the morning and at bedtime. 5.9 g 0  . cetirizine (ZYRTEC) 10 MG tablet Take 1 tablet (10 mg total) by mouth daily. 90 tablet 3  . cyclobenzaprine (FLEXERIL) 5 MG tablet Take 5 mg by mouth 2 (two) times daily.    . diclofenac Sodium (VOLTAREN) 1 % GEL APPLY 2 GRAMS EXTERNALLY TO THE AFFECTED AREA TWICE DAILY AS NEEDED FOR PAIN (Patient taking differently: Apply 2 g topically 2 (two) times daily as needed (pain). APPLY 2 GRAMS EXTERNALLY TO THE AFFECTED AREA TWICE DAILY AS NEEDED FOR PAIN) 100 g 0  . EPINEPHrine 0.3 mg/0.3 mL IJ SOAJ injection Inject 0.3 mg into the muscle as needed for anaphylaxis. 1 each 5  . gabapentin (NEURONTIN) 300 MG capsule Take 1 capsule (300 mg total) by mouth 4 (four) times daily as needed (pain). 120 capsule 11  . Glucos-Chond-Hyal Ac-Ca Fructo (MOVE FREE JOINT HEALTH ADVANCE PO) Take 1 tablet by mouth daily.    . hydrOXYzine (ATARAX/VISTARIL) 25 MG tablet Take 1 tablet (25 mg total) by mouth at bedtime. 90 tablet 3  . ipratropium (ATROVENT) 0.02 % nebulizer solution USE 2.5 ML(0.5 MG) VIA NEBULIZER THREE TIMES DAILY AS NEEDED FOR WHEEZING OR SHORTNESS OF BREATH (Patient taking differently: Take 0.5 mg by nebulization 3 (three) times daily as needed for wheezing or shortness of breath.)  1650 mL 1  . ISENTRESS 400 MG tablet TAKE 2 TABLETS(800 MG) BY MOUTH AT BEDTIME (Patient taking differently: Take 800 mg by mouth at bedtime. TAKE 2 TABLETS(800 MG) BY MOUTH AT BEDTIME) 60 tablet 5  . metoprolol succinate (TOPROL-XL) 100 MG 24 hr tablet Take 1 tablet (100 mg total) by mouth daily. 90 tablet 3  . mometasone-formoterol (DULERA) 200-5 MCG/ACT AERO INHALE 2 PUFFS BY MOUTH EVERY MORNING AND 2 PUFFS 12 HOURS LATER (Patient taking differently: Inhale 2 puffs into the lungs in the morning and at bedtime. INHALE 2 PUFFS BY MOUTH EVERY MORNING AND 2  PUFFS 12 HOURS LATER) 13 g 0  . Multiple Vitamin (MULTIVITAMIN WITH MINERALS) TABS tablet Take 1 tablet by mouth daily after breakfast.     . mupirocin ointment (BACTROBAN) 2 % APPLY EXTERNALLY TO THE AFFECTED AREA TWICE DAILY FOR 3 WEEKS, PLACE COTTON BETWEEN TOES TO ALLOW BETTER AERATION AS NEEDED 66 g 1  . omeprazole (PRILOSEC) 40 MG capsule Take 40 mg by mouth daily.    [name] OVER THE COUNTER MEDICATION Take 2 capsules by mouth daily. Zhou: thyroid supplement    . tizanidine (ZANAFLEX) 6 MG capsule Take 1 capsule (6 mg total) by mouth 3 (three) times daily. 90 capsule 11  . torsemide (DEMADEX) 20 MG tablet Take 10 mg by mouth See admin instructions. Tues Thurs and Sat    . TRUVADA 200-300 MG tablet Take 1 tablet by mouth daily. 30 tablet 5  . Ubrogepant (UBRELVY) 50 MG TABS Take 1 tablet at the onset of migraine can repeat in 2 hours if needed. 15 tablet 3  . [DISCONTINUED] zolpidem (AMBIEN) 5 MG tablet Take 1-2 tablets (5-10 mg total) by mouth at bedtime as needed for sleep. (Patient not taking: Reported on 04/23/2019) 30 tablet 2   No current facility-administered medications on file prior to visit.    Cardiovascular and other pertinent studies:  EKG 11/11/2020: Sinus rhythm 98 bpm  Normal EKG  Echocardiogram 05/06/2020:  Moderately depressed LV systolic function with EF 37%. Left ventricle  cavity is normal in size. Moderate concentric hypertrophy of the left  ventricle. Hypokinetic global wall motion. Doppler evidence of grade I  (impaired) diastolic dysfunction, normal LAP. Calculated EF 37%.  Structurally normal mitral valve. Mild (Grade I) mitral regurgitation.  Mild pulmonic regurgitation.   CCTA 04/07/2020: 1. Coronary calcium score of 0. 2. Normal coronary origin with right dominance. 3. Normal coronary arteries. RECOMMENDATIONS: 1. No evidence of CAD (0%). Consider non-atherosclerotic causes of chest pain.   Recent labs: 09/27/2020: Glucose 95, BUN/Cr 13/1.47. EGFR  55. Na/K 139/3.9. Rest of the CMP normal H/H 13/41. MCV 95. Platelets 162 HbA1C N/A% Chol 163, TG 114, HDL 45, LDL 97 (05/2020) TSH 3.2 normal  03/11/2020: Glucose 89.  BUN/creatinine 10/1.19.  GFR N/A. Na/K 139/3.9.  Rest of the CMP normal. H/H 14/43. MCV 96.  Platelets 169. Cholesterol 153, triglycerides 92, HDL 52, LDL 82.    Review of Systems  Cardiovascular: Positive for dyspnea on exertion (Mild). Negative for chest pain, leg swelling, palpitations and syncope.         Vitals:   11/11/20 1151 11/11/20 1203  BP: (!) 138/99 (!) 134/101  Pulse: 98 96  Resp: 17   SpO2: 97% 97%     Body mass index is 33.29 kg/m. Filed Weights   11/11/20 1151  Weight: 232 lb (105.2 kg)     Objective:   Physical Exam Vitals and nursing note reviewed.  Constitutional:  General: He is not in acute distress. Neck:     Vascular: No JVD.  Cardiovascular:     Rate and Rhythm: Normal rate and regular rhythm.     Heart sounds: Normal heart sounds. No murmur heard.   Pulmonary:     Effort: Pulmonary effort is normal.     Breath sounds: Normal breath sounds. No wheezing or rales.          Assessment & Recommendations:    60 year old African-American male with hypertension, hyperlipidemia, COPD, HIV.  Hypertension: Continue metoprolol succinate 100 mg daily. He is intolerant to multiple medications, including amlodipine, nifedipine, HCTZ, losartan, carvedilol. Continue pain control   I have advised him to keep a log of his BP readings and continue treatment of headaches as per Dr. Brett Fairy.   Hypertensive cardiomyopathy: EF around 45%. Normal coronaries. 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 No contraindication to driving.   No indication for Aspirin. Can be stopped.   F/u in 4 weeks   Nigel Mormon, MD Pager: [phone] Office: [phone]

## 2020-11-11 NOTE — Addendum Note (Signed)
Addended by: Carlean Purl on: 11/11/2020 03:47 PM   Modules accepted: Orders

## 2020-11-11 NOTE — Assessment & Plan Note (Addendum)
He is doing well Will change him to biktarvy for qday fdc  His allergies list genvoya and descovy. Seems unlikely given his tolerance of TRV/ISN Labs today He needs PCV 13 Will defer his B12 shots to his PCP, they are ok with me.  Will send to [place] for shingles vax Will see him back in 3-4 months for tolerance.

## 2020-11-12 DIAGNOSIS — Z4789 Encounter for other orthopedic aftercare: Secondary | ICD-10-CM | POA: Diagnosis not present

## 2020-11-12 DIAGNOSIS — M4322 Fusion of spine, cervical region: Secondary | ICD-10-CM | POA: Diagnosis not present

## 2020-11-12 DIAGNOSIS — G959 Disease of spinal cord, unspecified: Secondary | ICD-10-CM | POA: Diagnosis not present

## 2020-11-12 LAB — T-HELPER CELL (CD4) - (RCID CLINIC ONLY)
CD4 % Helper T Cell: 31 % — ABNORMAL LOW (ref 33–65)
CD4 T Cell Abs: 659 /uL (ref 400–1790)

## 2020-11-12 MED ORDER — CYANOCOBALAMIN 1000 MCG/ML IJ SOLN: 1000.0000 ug | INTRAMUSCULAR | 3 refills | Status: DC

## 2020-11-15 DIAGNOSIS — J301 Allergic rhinitis due to pollen: Secondary | ICD-10-CM | POA: Diagnosis not present

## 2020-11-15 DIAGNOSIS — J4530 Mild persistent asthma, uncomplicated: Secondary | ICD-10-CM | POA: Diagnosis not present

## 2020-11-15 DIAGNOSIS — J449 Chronic obstructive pulmonary disease, unspecified: Secondary | ICD-10-CM | POA: Diagnosis not present

## 2020-11-15 DIAGNOSIS — J3089 Other allergic rhinitis: Secondary | ICD-10-CM | POA: Diagnosis not present

## 2020-11-19 DIAGNOSIS — R519 Headache, unspecified: Secondary | ICD-10-CM | POA: Diagnosis not present

## 2020-11-19 DIAGNOSIS — M542 Cervicalgia: Secondary | ICD-10-CM | POA: Diagnosis not present

## 2020-11-19 DIAGNOSIS — M4322 Fusion of spine, cervical region: Secondary | ICD-10-CM | POA: Diagnosis not present

## 2020-11-22 DIAGNOSIS — M4322 Fusion of spine, cervical region: Secondary | ICD-10-CM | POA: Diagnosis not present

## 2020-11-22 DIAGNOSIS — M542 Cervicalgia: Secondary | ICD-10-CM | POA: Diagnosis not present

## 2020-11-22 DIAGNOSIS — M519 Unspecified thoracic, thoracolumbar and lumbosacral intervertebral disc disorder: Secondary | ICD-10-CM | POA: Diagnosis not present

## 2020-11-24 DIAGNOSIS — R519 Headache, unspecified: Secondary | ICD-10-CM | POA: Diagnosis not present

## 2020-11-24 DIAGNOSIS — M4322 Fusion of spine, cervical region: Secondary | ICD-10-CM | POA: Diagnosis not present

## 2020-11-24 DIAGNOSIS — M542 Cervicalgia: Secondary | ICD-10-CM | POA: Diagnosis not present

## 2020-11-30 DIAGNOSIS — R519 Headache, unspecified: Secondary | ICD-10-CM | POA: Diagnosis not present

## 2020-11-30 DIAGNOSIS — M542 Cervicalgia: Secondary | ICD-10-CM | POA: Diagnosis not present

## 2020-11-30 DIAGNOSIS — M4322 Fusion of spine, cervical region: Secondary | ICD-10-CM | POA: Diagnosis not present

## 2020-12-01 ENCOUNTER — Encounter: Payer: Self-pay | Admitting: Internal Medicine

## 2020-12-01 LAB — RPR: RPR Ser Ql: NONREACTIVE

## 2020-12-01 LAB — COMPREHENSIVE METABOLIC PANEL
AG Ratio: 1.7 (calc) (ref 1.0–2.5)
ALT: 17 U/L (ref 9–46)
AST: 20 U/L (ref 10–35)
Albumin: 4.2 g/dL (ref 3.6–5.1)
Alkaline phosphatase (APISO): 125 U/L (ref 35–144)
BUN: 12 mg/dL (ref 7–25)
CO2: 30 mmol/L (ref 20–32)
Calcium: 9.5 mg/dL (ref 8.6–10.3)
Chloride: 103 mmol/L (ref 98–110)
Creat: 1.24 mg/dL (ref 0.70–1.33)
Globulin: 2.5 g/dL (calc) (ref 1.9–3.7)
Glucose, Bld: 93 mg/dL (ref 65–99)
Potassium: 4.2 mmol/L (ref 3.5–5.3)
Sodium: 139 mmol/L (ref 135–146)
Total Bilirubin: 0.7 mg/dL (ref 0.2–1.2)
Total Protein: 6.7 g/dL (ref 6.1–8.1)

## 2020-12-01 LAB — PSA: PSA: <0.04 ng/mL (ref <=4.00)

## 2020-12-01 LAB — CBC
HCT: 38.6 % (ref 38.5–50.0)
Hemoglobin: 12.9 g/dL — ABNORMAL LOW (ref 13.2–17.1)
MCH: 32.3 pg (ref 27.0–33.0)
MCHC: 33.4 g/dL (ref 32.0–36.0)
MCV: 96.7 fL (ref 80.0–100.0)
MPV: 13.3 fL — ABNORMAL HIGH (ref 7.5–12.5)
Platelets: 134 10*3/uL — ABNORMAL LOW (ref 140–400)
RBC: 3.99 10*6/uL — ABNORMAL LOW (ref 4.20–5.80)
RDW: 13.3 % (ref 11.0–15.0)
WBC: 5.3 10*3/uL (ref 3.8–10.8)

## 2020-12-01 LAB — HIV-1 RNA ULTRAQUANT REFLEX TO GENTYP+
HIV 1 RNA Quant: NOT DETECTED copies/mL
HIV-1 RNA Quant, Log: NOT DETECTED Log copies/mL

## 2020-12-02 ENCOUNTER — Encounter: Payer: Self-pay | Admitting: Neurology

## 2020-12-02 DIAGNOSIS — M4322 Fusion of spine, cervical region: Secondary | ICD-10-CM | POA: Diagnosis not present

## 2020-12-02 DIAGNOSIS — M542 Cervicalgia: Secondary | ICD-10-CM | POA: Diagnosis not present

## 2020-12-02 DIAGNOSIS — R519 Headache, unspecified: Secondary | ICD-10-CM | POA: Diagnosis not present

## 2020-12-02 NOTE — Telephone Encounter (Signed)
During her office visit we discussed mainly his headaches.  He did not voice any of these concerns in the office visit only ask if he can go back on Ambien.  Patient may need to be seen for these concerns? Up to Dr. Brett Fairy

## 2020-12-03 ENCOUNTER — Other Ambulatory Visit: Payer: Self-pay | Admitting: Infectious Diseases

## 2020-12-03 DIAGNOSIS — J449 Chronic obstructive pulmonary disease, unspecified: Secondary | ICD-10-CM

## 2020-12-04 ENCOUNTER — Other Ambulatory Visit: Payer: Self-pay | Admitting: Infectious Diseases

## 2020-12-04 DIAGNOSIS — J449 Chronic obstructive pulmonary disease, unspecified: Secondary | ICD-10-CM

## 2020-12-06 ENCOUNTER — Telehealth: Payer: Self-pay | Admitting: Neurology

## 2020-12-06 ENCOUNTER — Other Ambulatory Visit: Payer: Self-pay | Admitting: Neurology

## 2020-12-06 ENCOUNTER — Telehealth: Payer: Self-pay

## 2020-12-06 ENCOUNTER — Other Ambulatory Visit: Payer: Self-pay | Admitting: Infectious Diseases

## 2020-12-06 DIAGNOSIS — J449 Chronic obstructive pulmonary disease, unspecified: Secondary | ICD-10-CM

## 2020-12-06 DIAGNOSIS — G4719 Other hypersomnia: Secondary | ICD-10-CM

## 2020-12-06 DIAGNOSIS — G63 Polyneuropathy in diseases classified elsewhere: Secondary | ICD-10-CM

## 2020-12-06 DIAGNOSIS — G4700 Insomnia, unspecified: Secondary | ICD-10-CM

## 2020-12-06 DIAGNOSIS — B20 Human immunodeficiency virus [HIV] disease: Secondary | ICD-10-CM

## 2020-12-06 DIAGNOSIS — J441 Chronic obstructive pulmonary disease with (acute) exacerbation: Secondary | ICD-10-CM

## 2020-12-06 NOTE — Telephone Encounter (Signed)
Dear Dr. Jenny Reichmann,  That was a mistake on my part. Sorry- nothing you need to do-  I documented the Epworth Sleepiness Score and the Fatigue severity score to attach to insurance order for a repeat sleep study, based on both being so very high.  Larey Seat, MD

## 2020-12-06 NOTE — Telephone Encounter (Signed)
Sorry, I am not sure what this means, but I would be glad to do what you suggest if needed

## 2020-12-06 NOTE — Telephone Encounter (Signed)
Called pt daughter to inform her about the message above. Pt daughter understood

## 2020-12-06 NOTE — Telephone Encounter (Signed)
Epworth 14 points, sudden onset of severe fatigue , FSS at 53/63 points.

## 2020-12-06 NOTE — Telephone Encounter (Signed)
Take an additional 1/2 tab of metoprolol today. Continue to monitor BP readings and bring to next visit on 6/10.  Thanks MJP

## 2020-12-07 ENCOUNTER — Other Ambulatory Visit: Payer: Self-pay | Admitting: Infectious Diseases

## 2020-12-07 DIAGNOSIS — G4700 Insomnia, unspecified: Secondary | ICD-10-CM

## 2020-12-07 DIAGNOSIS — J449 Chronic obstructive pulmonary disease, unspecified: Secondary | ICD-10-CM

## 2020-12-07 DIAGNOSIS — M542 Cervicalgia: Secondary | ICD-10-CM | POA: Diagnosis not present

## 2020-12-07 DIAGNOSIS — M4322 Fusion of spine, cervical region: Secondary | ICD-10-CM | POA: Diagnosis not present

## 2020-12-07 DIAGNOSIS — R519 Headache, unspecified: Secondary | ICD-10-CM | POA: Diagnosis not present

## 2020-12-07 NOTE — Telephone Encounter (Signed)
Pt's sister(on DPR) is asking for a call re: if any updates are available in response to hearing from the insurance company on the approval of a repeat sleep study.  Please call.

## 2020-12-08 NOTE — Telephone Encounter (Signed)
The sister also messaged in [place] and I responded to her yesterday evening. The order was placed on 12/06/20 and insurance has to approve this first.

## 2020-12-09 DIAGNOSIS — M4322 Fusion of spine, cervical region: Secondary | ICD-10-CM | POA: Diagnosis not present

## 2020-12-09 DIAGNOSIS — M542 Cervicalgia: Secondary | ICD-10-CM | POA: Diagnosis not present

## 2020-12-09 DIAGNOSIS — R519 Headache, unspecified: Secondary | ICD-10-CM | POA: Diagnosis not present

## 2020-12-10 ENCOUNTER — Other Ambulatory Visit: Payer: Self-pay

## 2020-12-10 ENCOUNTER — Encounter: Payer: Self-pay | Admitting: Cardiology

## 2020-12-10 ENCOUNTER — Ambulatory Visit: Payer: Medicare Other | Admitting: Cardiology

## 2020-12-10 VITALS — BP 136/89 | HR 108 | Temp 98.2°F | Resp 17 | Ht 70.0 in | Wt 231.0 lb

## 2020-12-10 DIAGNOSIS — I10 Essential (primary) hypertension: Secondary | ICD-10-CM | POA: Diagnosis not present

## 2020-12-10 MED ORDER — LABETALOL HCL 200 MG PO TABS: 100.0000 mg | ORAL_TABLET | Freq: Two times a day (BID) | ORAL | 3 refills | Status: DC

## 2020-12-10 NOTE — Progress Notes (Signed)
Patient referred by Biagio Borg, MD for hypertension  Subjective:   Casey Brennan, male    DOB: 02-15-1961, 60 y.o.   MRN: [id number]   Chief Complaint  Patient presents with   Hypertension   Follow-up    57 week    60 year old male with hypertension, hyperlipidemia, HIV.  Blood pressure elevated for past few days.  Complains of headache.  Initial consultation HPI 05/2020: Patient was recently seen by [facility] Dr. Eleonore Chiquito.  Prior to that, he was also seen by Drs. Harwani and [place].  His prior cardiac work-up showed coronary CTA with normal coronary arteries and calcium score 0.  He has not tolerated several antihypertensive medications in the past, including amlodipine, nifedipine, HCTZ, losartan, carvedilol. He is tolerating metoprolol succinate.  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 He uses Aspirin 325 mg on M,W,F during his trips.     Current Outpatient Medications on File Prior to Visit  Medication Sig Dispense Refill   albuterol (PROVENTIL) (2.5 MG/3ML) 0.083% nebulizer solution Take 3 mLs (2.5 mg total) by nebulization every 6 (six) hours as needed for wheezing or shortness of breath. 75 mL 3   albuterol (VENTOLIN HFA) 108 (90 Base) MCG/ACT inhaler INHALE 2 PUFFS INTO THE LUNGS EVERY 6 HOURS AS NEEDED FOR WHEEZING OR SHORTNESS OF BREATH 36 g 0   ALPRAZolam (XANAX) 0.5 MG tablet TAKE 1 TABLET(0.5 MG) BY MOUTH TWICE DAILY AS NEEDED FOR SLEEP 180 tablet 0   aspirin EC 81 MG tablet Take 81 mg by mouth every Monday, Wednesday, and Friday.      Beclomethasone Dipropionate (QNASL) 80 MCG/ACT AERS Place 2 sprays into the nose daily. 10.6 g 0   bictegravir-emtricitabine-tenofovir AF (BIKTARVY) 50-200-25 MG TABS tablet Take 1 tablet by mouth daily. 30 tablet 11   Budeson-Glycopyrrol-Formoterol (BREZTRI AEROSPHERE) 160-9-4.8 MCG/ACT AERO Inhale 2 puffs into the lungs in the morning and at bedtime. 5.9 g 0   cetirizine (ZYRTEC)  10 MG tablet Take 1 tablet (10 mg total) by mouth daily. 90 tablet 3   ciprofloxacin (CIPRO) 500 MG tablet Take 500 mg by mouth 2 (two) times daily.     cyanocobalamin (,VITAMIN B-12,) 1000 MCG/ML injection Inject 1 mL (1,000 mcg total) into the muscle every 30 (thirty) days. 3 mL 3   cyclobenzaprine (FLEXERIL) 5 MG tablet Take 5 mg by mouth 2 (two) times daily.     diclofenac Sodium (VOLTAREN) 1 % GEL APPLY 2 GRAMS EXTERNALLY TO THE AFFECTED AREA TWICE DAILY AS NEEDED FOR PAIN (Patient taking differently: Apply 2 g topically 2 (two) times daily as needed (pain). APPLY 2 GRAMS EXTERNALLY TO THE AFFECTED AREA TWICE DAILY AS NEEDED FOR PAIN) 100 g 0   EPINEPHrine 0.3 mg/0.3 mL IJ SOAJ injection Inject 0.3 mg into the muscle as needed for anaphylaxis. 1 each 5   gabapentin (NEURONTIN) 300 MG capsule Take 1 capsule (300 mg total) by mouth 4 (four) times daily as needed (pain). 120 capsule 11   Glucos-Chond-Hyal Ac-Ca Fructo (MOVE FREE JOINT HEALTH ADVANCE PO) Take 1 tablet by mouth daily.     HYDROcodone-acetaminophen (NORCO/VICODIN) 5-325 MG tablet Take 1 tablet by mouth every 6 (six) hours as needed.     hydrOXYzine (ATARAX/VISTARIL) 25 MG tablet Take 1 tablet (25 mg total) by mouth at bedtime. 90 tablet 3   metoprolol succinate (TOPROL-XL) 100 MG 24 hr tablet Take 1 tablet (100 mg total) by mouth daily.  90 tablet 3   Multiple Vitamin (MULTIVITAMIN WITH MINERALS) TABS tablet Take 1 tablet by mouth daily after breakfast.      mupirocin ointment (BACTROBAN) 2 % APPLY EXTERNALLY TO THE AFFECTED AREA TWICE DAILY FOR 3 WEEKS, PLACE COTTON BETWEEN TOES TO ALLOW BETTER AERATION AS NEEDED 66 g 1   omeprazole (PRILOSEC) 40 MG capsule Take 40 mg by mouth daily.     OVER THE COUNTER MEDICATION Take 2 capsules by mouth daily. Zhou: thyroid supplement     tizanidine (ZANAFLEX) 6 MG capsule Take 1 capsule (6 mg total) by mouth 3 (three) times daily. 90 capsule 11   torsemide (DEMADEX) 20 MG tablet Take 10 mg by  mouth See admin instructions. Tues Thurs and Sat     Ubrogepant (UBRELVY) 50 MG TABS Take 1 tablet at the onset of migraine can repeat in 2 hours if needed. 15 tablet 3   [DISCONTINUED] zolpidem (AMBIEN) 5 MG tablet Take 1-2 tablets (5-10 mg total) by mouth at bedtime as needed for sleep. (Patient not taking: Reported on 04/23/2019) 30 tablet 2   No current facility-administered medications on file prior to visit.    Cardiovascular and other pertinent studies:  EKG 11/11/2020: Sinus rhythm 98 bpm  Normal EKG  Echocardiogram 05/06/2020:  Moderately depressed LV systolic function with EF 37%. Left ventricle  cavity is normal in size. Moderate concentric hypertrophy of the left  ventricle. Hypokinetic global wall motion. Doppler evidence of grade I  (impaired) diastolic dysfunction, normal LAP. Calculated EF 37%.  Structurally normal mitral valve.  Mild (Grade I) mitral regurgitation.  Mild pulmonic regurgitation.   CCTA 04/07/2020: 1. Coronary calcium score of 0. 2. Normal coronary origin with right dominance. 3. Normal coronary arteries. RECOMMENDATIONS: 1. No evidence of CAD (0%). Consider non-atherosclerotic causes of chest pain.   Recent labs: 09/27/2020: Glucose 95, BUN/Cr 13/1.47. EGFR 55. Na/K 139/3.9. Rest of the CMP normal H/H 13/41. MCV 95. Platelets 162 HbA1C N/A% Chol 163, TG 114, HDL 45, LDL 97 (05/2020) TSH 3.2 normal  03/11/2020: Glucose 89.  BUN/creatinine 10/1.19.  GFR N/A. Na/K 139/3.9.  Rest of the CMP normal. H/H 14/43. MCV 96.  Platelets 169. Cholesterol 153, triglycerides 92, HDL 52, LDL 82.    Review of Systems  Cardiovascular:  Positive for dyspnea on exertion (Mild). Negative for chest pain, leg swelling, palpitations and syncope.        Vitals:   12/10/20 0959  BP: 136/89  Pulse: (!) 108  Resp: 17  Temp: 98.2 F (36.8 C)  SpO2: 95%    Body mass index is 33.15 kg/m. Filed Weights   12/10/20 0959  Weight: 231 lb (104.8 kg)      Objective:   Physical Exam Vitals and nursing note reviewed.  Constitutional:      General: He is not in acute distress. Neck:     Vascular: No JVD.  Cardiovascular:     Rate and Rhythm: Normal rate and regular rhythm.     Heart sounds: Normal heart sounds. No murmur heard. Pulmonary:     Effort: Pulmonary effort is normal.     Breath sounds: Normal breath sounds. No wheezing or rales.         Assessment & Recommendations:    60 year old African-American male with hypertension, hyperlipidemia, COPD, HIV.  Hypertension: Recent increase in blood pressure, previously well controlled.  Currently on metoprolol succinate 100 mg daily.  Switch to labetalol 200 mg twice daily.  Given sudden increase in blood pressure, will check  renal artery ultrasound to rule out renal artery duplex. F/u w/Dr. Brett Fairy re: sleep study.  Hypertensive cardiomyopathy: Echocardiogram in 05/2021 personally reviewed. EF around 45%. Normal coronaries (CTA 04/2020). 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 No contraindication to driving.   F/u in 4 weeks   Nigel Mormon, MD Pager: [phone] Office: [phone]

## 2020-12-13 DIAGNOSIS — D2371 Other benign neoplasm of skin of right lower limb, including hip: Secondary | ICD-10-CM | POA: Diagnosis not present

## 2020-12-13 DIAGNOSIS — L821 Other seborrheic keratosis: Secondary | ICD-10-CM | POA: Diagnosis not present

## 2020-12-15 ENCOUNTER — Telehealth: Payer: Self-pay

## 2020-12-15 DIAGNOSIS — R519 Headache, unspecified: Secondary | ICD-10-CM | POA: Diagnosis not present

## 2020-12-15 DIAGNOSIS — M542 Cervicalgia: Secondary | ICD-10-CM | POA: Diagnosis not present

## 2020-12-15 DIAGNOSIS — M4322 Fusion of spine, cervical region: Secondary | ICD-10-CM | POA: Diagnosis not present

## 2020-12-15 NOTE — Telephone Encounter (Signed)
Pts sister called because pts bp has been high, today ot was 178/110 and his pulse is 86.  Yesterday morning his bp was 160/102 HR 100. Last night I was 159/101. These BP readings were done a few hours after taking the half tablet of labetalol. Pt had a really bad headache and felt a little light headed.

## 2020-12-15 NOTE — Telephone Encounter (Signed)
Agree 

## 2020-12-15 NOTE — Telephone Encounter (Signed)
Recommend increasing to one full tablet twice daily.I suspect that the headaches are due to elevated blood pressure. I am optimistic that headache could improve with better blood pressure control.  Thanks MJP

## 2020-12-15 NOTE — Telephone Encounter (Signed)
The pt would like to know if he can go back to metoprolol because this medication is not working. I informed his sister that it could be that the dose was not high enough which is why it was increased. I asked if he could try it for a few days and let us know how it goes but pt is really would like the metoprolol back instead.

## 2020-12-15 NOTE — Telephone Encounter (Signed)
Called and spoke to pt, pt was still a bit confused. Transferred call to Dr. Virgina Jock.

## 2020-12-18 ENCOUNTER — Encounter (HOSPITAL_COMMUNITY): Payer: Self-pay

## 2020-12-18 ENCOUNTER — Emergency Department (HOSPITAL_COMMUNITY): Payer: Medicare Other

## 2020-12-18 ENCOUNTER — Other Ambulatory Visit: Payer: Self-pay

## 2020-12-18 ENCOUNTER — Emergency Department (HOSPITAL_COMMUNITY)
Admission: EM | Admit: 2020-12-18 | Discharge: 2020-12-18 | Disposition: A | Discharge status: Home / Self Care | Payer: Medicare Other | Attending: Emergency Medicine | Admitting: Emergency Medicine

## 2020-12-18 DIAGNOSIS — R1031 Right lower quadrant pain: Secondary | ICD-10-CM | POA: Diagnosis present

## 2020-12-18 DIAGNOSIS — N3001 Acute cystitis with hematuria: Secondary | ICD-10-CM | POA: Diagnosis not present

## 2020-12-18 DIAGNOSIS — D3500 Benign neoplasm of unspecified adrenal gland: Secondary | ICD-10-CM | POA: Diagnosis not present

## 2020-12-18 DIAGNOSIS — Z87891 Personal history of nicotine dependence: Secondary | ICD-10-CM | POA: Diagnosis not present

## 2020-12-18 DIAGNOSIS — K429 Umbilical hernia without obstruction or gangrene: Secondary | ICD-10-CM | POA: Diagnosis not present

## 2020-12-18 DIAGNOSIS — I129 Hypertensive chronic kidney disease with stage 1 through stage 4 chronic kidney disease, or unspecified chronic kidney disease: Secondary | ICD-10-CM | POA: Diagnosis not present

## 2020-12-18 DIAGNOSIS — N281 Cyst of kidney, acquired: Secondary | ICD-10-CM | POA: Diagnosis not present

## 2020-12-18 DIAGNOSIS — J449 Chronic obstructive pulmonary disease, unspecified: Secondary | ICD-10-CM | POA: Diagnosis not present

## 2020-12-18 DIAGNOSIS — N182 Chronic kidney disease, stage 2 (mild): Secondary | ICD-10-CM | POA: Insufficient documentation

## 2020-12-18 DIAGNOSIS — Z8546 Personal history of malignant neoplasm of prostate: Secondary | ICD-10-CM | POA: Diagnosis not present

## 2020-12-18 DIAGNOSIS — N2889 Other specified disorders of kidney and ureter: Secondary | ICD-10-CM

## 2020-12-18 DIAGNOSIS — Z96651 Presence of right artificial knee joint: Secondary | ICD-10-CM | POA: Diagnosis not present

## 2020-12-18 DIAGNOSIS — J45909 Unspecified asthma, uncomplicated: Secondary | ICD-10-CM | POA: Diagnosis not present

## 2020-12-18 DIAGNOSIS — K3189 Other diseases of stomach and duodenum: Secondary | ICD-10-CM | POA: Diagnosis not present

## 2020-12-18 DIAGNOSIS — Z21 Asymptomatic human immunodeficiency virus [HIV] infection status: Secondary | ICD-10-CM | POA: Diagnosis not present

## 2020-12-18 DIAGNOSIS — I878 Other specified disorders of veins: Secondary | ICD-10-CM | POA: Diagnosis not present

## 2020-12-18 DIAGNOSIS — R58 Hemorrhage, not elsewhere classified: Secondary | ICD-10-CM | POA: Diagnosis not present

## 2020-12-18 DIAGNOSIS — Z79899 Other long term (current) drug therapy: Secondary | ICD-10-CM | POA: Diagnosis not present

## 2020-12-18 LAB — URINALYSIS, ROUTINE W REFLEX MICROSCOPIC
Bacteria, UA: NONE SEEN
Bilirubin Urine: NEGATIVE
Glucose, UA: NEGATIVE mg/dL
Ketones, ur: NEGATIVE mg/dL
Nitrite: NEGATIVE
Protein, ur: 100 mg/dL — AB
Specific Gravity, Urine: 1.01 (ref 1.005–1.030)
WBC, UA: >50 WBC/hpf — ABNORMAL HIGH (ref 0–5)
pH: 6 (ref 5.0–8.0)

## 2020-12-18 LAB — CBC WITH DIFFERENTIAL/PLATELET
Abs Immature Granulocytes: 0.02 10*3/uL (ref 0.00–0.07)
Basophils Absolute: 0.1 10*3/uL (ref 0.0–0.1)
Basophils Relative: 1 %
Eosinophils Absolute: 0.3 10*3/uL (ref 0.0–0.5)
Eosinophils Relative: 3 %
HCT: 39.4 % (ref 39.0–52.0)
Hemoglobin: 13.4 g/dL (ref 13.0–17.0)
Immature Granulocytes: 0 %
Lymphocytes Relative: 26 %
Lymphs Abs: 2.3 10*3/uL (ref 0.7–4.0)
MCH: 32.2 pg (ref 26.0–34.0)
MCHC: 34 g/dL (ref 30.0–36.0)
MCV: 94.7 fL (ref 80.0–100.0)
Monocytes Absolute: 0.7 10*3/uL (ref 0.1–1.0)
Monocytes Relative: 8 %
Neutro Abs: 5.4 10*3/uL (ref 1.7–7.7)
Neutrophils Relative %: 62 %
Platelets: 229 10*3/uL (ref 150–400)
RBC: 4.16 MIL/uL — ABNORMAL LOW (ref 4.22–5.81)
RDW: 11.9 % (ref 11.5–15.5)
WBC: 8.8 10*3/uL (ref 4.0–10.5)
nRBC: 0 % (ref 0.0–0.2)

## 2020-12-18 LAB — COMPREHENSIVE METABOLIC PANEL
ALT: 31 U/L (ref 0–44)
AST: 30 U/L (ref 15–41)
Albumin: 4.1 g/dL (ref 3.5–5.0)
Alkaline Phosphatase: 126 U/L (ref 38–126)
Anion gap: 8 (ref 5–15)
BUN: 8 mg/dL (ref 6–20)
CO2: 27 mmol/L (ref 22–32)
Calcium: 9.2 mg/dL (ref 8.9–10.3)
Chloride: 101 mmol/L (ref 98–111)
Creatinine, Ser: 1.38 mg/dL — ABNORMAL HIGH (ref 0.61–1.24)
GFR, Estimated: 60 mL/min — ABNORMAL LOW (ref >60)
Glucose, Bld: 114 mg/dL — ABNORMAL HIGH (ref 70–99)
Potassium: 3.5 mmol/L (ref 3.5–5.1)
Sodium: 136 mmol/L (ref 135–145)
Total Bilirubin: 0.9 mg/dL (ref 0.3–1.2)
Total Protein: 7.5 g/dL (ref 6.5–8.1)

## 2020-12-18 MED ORDER — NITROFURANTOIN MONOHYD MACRO 100 MG PO CAPS: 100.0000 mg | ORAL_CAPSULE | Freq: Two times a day (BID) | ORAL | 0 refills | Status: DC

## 2020-12-18 MED ORDER — HYDROMORPHONE HCL 1 MG/ML IJ SOLN
1.0000 mg | Freq: Once | INTRAMUSCULAR | Status: AC
  Administered 2020-12-18: 1 mg via INTRAVENOUS
  Filled 2020-12-18: qty 1

## 2020-12-18 MED ORDER — ONDANSETRON HCL 4 MG/2ML IJ SOLN
4.0000 mg | Freq: Once | INTRAMUSCULAR | Status: AC
  Administered 2020-12-18: 4 mg via INTRAVENOUS
  Filled 2020-12-18: qty 2

## 2020-12-18 MED ORDER — HYDROMORPHONE HCL 2 MG PO TABS: 2.0000 mg | ORAL_TABLET | Freq: Four times a day (QID) | ORAL | 0 refills | Status: DC | PRN

## 2020-12-18 NOTE — ED Triage Notes (Signed)
Patient complains of right flank pain x 2 hours. States that the pain radiates to groin. Pain with urination. NAD

## 2020-12-18 NOTE — ED Notes (Signed)
Patient discharge instructions reviewed with the patient. The patient verbalized understanding. Patient discharged.

## 2020-12-18 NOTE — ED Provider Notes (Signed)
[facility] EMERGENCY DEPARTMENT Provider Note   CSN: [id number] Arrival date & time: 12/18/20  2130     History No chief complaint on file.   Casey Brennan is a 60 y.o. male.  Casey Brennan has a history of renal colic.  He awoke at 3 AM with severe right-sided flank pain radiating to his right abdomen and groin.  It feels like someone is kicking him.  He states that he has some mild discomfort immediately after urination.  He reports no hematuria.  No fevers or chills.  He does endorse some nausea.  The history is provided by the patient.  Flank Pain This is a new problem. Episode onset: 10 hours ago. The problem occurs constantly. The problem has not changed since onset.Associated symptoms include abdominal pain (right lower). Pertinent negatives include no chest pain, no headaches and no shortness of breath. Nothing aggravates the symptoms. Nothing relieves the symptoms. He has tried nothing for the symptoms. The treatment provided no relief.      Past Medical History:  Diagnosis Date   Arthritis    r knee    Asthma    very rare   Axillary lymphadenopathy    Bronchitis    Chronic anemia    normocytic   Chronic folliculitis    Elevated PSA    Environmental and seasonal allergies    GERD (gastroesophageal reflux disease)    Patient denies   Gross hematuria    H/O pericarditis    2010--  myopercarditis--  resolved   HCAP (healthcare-associated pneumonia) 11/19/2014   Headache(784.0)    HX SEVERE FRONTAL HA'S   Hiatal hernia    History of concussion    2012  &  2013  RESIDUAL HA'S --  RESOLVED   History of gastric ulcer    History of kidney stones    History of MRSA infection 2010   infected boil   HIV (human immunodeficiency virus infection) ([place]) 1988   Hypertension    Internal hemorrhoids    Lytic bone lesion of hip    WORK-UP DONE BY ONCOLOGIST DR HA --  NOT MALIGNANT   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   Pneumonia    hx of   Post  concussion syndrome    resolved   Prostate cancer ([place]) 04/25/13   gleason 3+3=6, volume 30 gm   Sinusitis, chronic 05/14/2015   Ulcer    hx of gastric   Wears glasses     Patient Active Problem List   Diagnosis Date Noted   S/P cervical spinal fusion 11/11/2020   Nevus 11/11/2020   Myelopathy ([place]) 09/29/2020   Encounter for well adult exam with abnormal findings 06/23/2020   Elevated TSH 05/20/2020   Mixed hyperlipidemia 05/12/2020   Generalized abdominal pain 07/07/2019   Peripheral neuropathy 06/02/2019   Healthcare maintenance 01/08/2019   Tension headache 11/12/2017   Hepatitis B immune 06/06/2017   Chronic pain of left wrist 06/06/2017   Constipation 09/27/2016   Primary osteoarthritis of right knee 08/01/2016   Chronic renal insufficiency, stage 2 (mild) 05/22/2016   Skin lesion of right lower extremity 02/22/2016   Migraines 02/22/2016   Low back pain 02/22/2016   Osteoarthritis of right knee 01/11/2016   Kidney stones 10/25/2015   Sinusitis, chronic 05/14/2015   Cough 01/18/2015   GERD (gastroesophageal reflux disease) 07/29/2014   COPD GOLD III 07/22/2014   Hx of primary hypertension 04/27/2014   H/O prostate cancer    Lytic bone  lesion of hip 08/22/2012   Cataract, nuclear 05/15/2012   Asthma 01/23/2012   Insomnia 05/11/2011   Primary hypertension 04/17/2011   Mood disorder in conditions classified elsewhere 06/14/2009   Human immunodeficiency virus (HIV) disease ([place]) 04/19/2009    Past Surgical History:  Procedure Laterality Date   ACDCF  09/29/2020   ANTERIOR CERVICAL DECOMPRESSION/DISCECTOMY FUSION 4 LEVELS N/A 09/29/2020   Procedure: ANTERIOR CERVICAL DECOMPRESSION FUSION CERVICAL 3-CERVICAL 4 , CERVICAL 4 - CERVICAL 5, CERVICAL 5 - CERVICAL 6 WITH INSTRUMENTATION AND ALLOGRAFT;  Surgeon: Phylliss Bob, MD;  Location: [facility];  Service: Orthopedics;  Laterality: N/A;   CARDIAC CATHETERIZATION  03-23-2009  DR COOPER   NORMAL CORONARY ARTERIES    CHOLECYSTECTOMY N/A 11/06/2014   Procedure: LAPAROSCOPIC CHOLECYSTECTOMY WITH INTRAOPERATIVE CHOLANGIOGRAM;  Surgeon: Judeth Horn, MD;  Location: [facility];  Service: General;  Laterality: N/A;   COLONOSCOPY  12/26/2011   Procedure: COLONOSCOPY;  Surgeon: Lear Ng, MD;  Location: [facility];  Service: Endoscopy;  Laterality: N/A;   COLONOSCOPY     COLONOSCOPY WITH PROPOFOL N/A 07/29/2014   Procedure: COLONOSCOPY WITH PROPOFOL;  Surgeon: Lear Ng, MD;  Location: [facility];  Service: Endoscopy;  Laterality: N/A;   CYSTOSCOPY N/A 05/18/2018   Procedure: CYSTOSCOPY REMOVAL FOREIGN BODY;  Surgeon: Kathie Rhodes, MD;  Location: [facility];  Service: Urology;  Laterality: N/A;   CYSTOSCOPY/RETROGRADE/URETEROSCOPY Bilateral 04/25/2013   Procedure: CYSTOSCOPY/ BILATERAL RETROGRADES; BLADDER BIOPSIES;  Surgeon: Alexis Frock, MD;  Location: [facility];  Service: Urology;  Laterality: Bilateral;   DENTAL EXAMINATION UNDER ANESTHESIA     ESOPHAGOGASTRODUODENOSCOPY  12/26/2011   Procedure: ESOPHAGOGASTRODUODENOSCOPY (EGD);  Surgeon: Lear Ng, MD;  Location: [facility];  Service: Endoscopy;  Laterality: N/A;   ESOPHAGOGASTRODUODENOSCOPY (EGD) WITH PROPOFOL N/A 07/29/2014   Procedure: ESOPHAGOGASTRODUODENOSCOPY (EGD) WITH PROPOFOL;  Surgeon: Lear Ng, MD;  Location: [facility];  Service: Endoscopy;  Laterality: N/A;   EXCISION CHRONIC LEFT BREAST ABSCESS  09-21-2010   EXCISIONAL BX LEFT BREAST MASS/  I  &  D LEFT BREAST ABSCESS  03-24-2009   KNEE ARTHROSCOPY Right 1985   left axilla biopsy  04/2013   left hip biopsy  10/2012   LYMPHADENECTOMY Bilateral 08/18/2013   Procedure: LYMPHADENECTOMY "PELVIC LYMPH NODE DISSECTION";  Surgeon: Alexis Frock, MD;  Location: [facility];  Service: Urology;  Laterality: Bilateral;   PROSTATE BIOPSY N/A 04/25/2013   Procedure: BIOPSY TRANSRECTAL ULTRASONIC PROSTATE (TUBP);  Surgeon: Alexis Frock, MD;  Location: [facility];  Service: Urology;  Laterality: N/A;   ROBOT ASSISTED LAPAROSCOPIC RADICAL PROSTATECTOMY N/A 08/18/2013   Procedure: ROBOTIC ASSISTED LAPAROSCOPIC RADICAL PROSTATECTOMY;  Surgeon: Alexis Frock, MD;  Location: [facility];  Service: Urology;  Laterality: N/A;   TOTAL KNEE ARTHROPLASTY Right 08/01/2016   08/17/16   TOTAL KNEE ARTHROPLASTY Right 08/01/2016   Procedure: TOTAL KNEE ARTHROPLASTY;  Surgeon: Renette Butters, MD;  Location: [facility];  Service: Orthopedics;  Laterality: Right;   UPPER GASTROINTESTINAL ENDOSCOPY     UPPER LEG SOFT TISSUE BIOPSY Left 2012   lthigh       Family History  Problem Relation Age of Onset   Stroke Father    Diabetes Father    Cancer Father        brain cancer   Asthma Father    Hypertension Sister    Cancer Maternal Uncle        prostate cancer   Asthma Sister    Colon cancer Neg Hx  Colon polyps Neg Hx    Esophageal cancer Neg Hx    Stomach cancer Neg Hx    Rectal cancer Neg Hx     Social History   Tobacco Use   Smoking status: Former    Packs/day: 1.50    Years: 23.00    Pack years: 34.50    Types: Cigars, Cigarettes    Quit date: 05/22/2010    Years since quitting: 10.5   Smokeless tobacco: Never  Vaping Use   Vaping Use: Never used  Substance Use Topics   Alcohol use: Not Currently    Alcohol/week: 0.0 standard drinks    Comment: 1-2 drinks a week    Drug use: No    Home Medications Prior to Admission medications   Medication Sig Start Date End Date Taking? Authorizing Provider  albuterol (PROVENTIL) (2.5 MG/3ML) 0.083% nebulizer solution Take 3 mLs (2.5 mg total) by nebulization every 6 (six) hours as needed for wheezing or shortness of breath. 09/28/20   Rigoberto Noel, MD  albuterol (VENTOLIN HFA) 108 (90 Base) MCG/ACT inhaler INHALE 2 PUFFS INTO THE LUNGS EVERY 6 HOURS AS NEEDED FOR WHEEZING OR SHORTNESS OF BREATH 12/06/20   Campbell Riches, MD  ALPRAZolam (XANAX) 0.5 MG tablet TAKE 1 TABLET(0.5 MG) BY  MOUTH TWICE DAILY AS NEEDED FOR SLEEP 12/07/20   Dohmeier, Asencion Partridge, MD  Beclomethasone Dipropionate (QNASL) 80 MCG/ACT AERS Place 2 sprays into the nose daily. 11/02/20   Rigoberto Noel, MD  bictegravir-emtricitabine-tenofovir AF (BIKTARVY) 50-200-25 MG TABS tablet Take 1 tablet by mouth daily. 11/11/20   Kuppelweiser, Cassie L, RPH-CPP  Budeson-Glycopyrrol-Formoterol (BREZTRI AEROSPHERE) 160-9-4.8 MCG/ACT AERO Inhale 2 puffs into the lungs in the morning and at bedtime. 09/28/20   Rigoberto Noel, MD  cetirizine (ZYRTEC) 10 MG tablet Take 1 tablet (10 mg total) by mouth daily. 10/18/20 10/18/21  Biagio Borg, MD  cyanocobalamin (,VITAMIN B-12,) 1000 MCG/ML injection Inject 1 mL (1,000 mcg total) into the muscle every 30 (thirty) days. 11/12/20   Biagio Borg, MD  cyclobenzaprine (FLEXERIL) 5 MG tablet Take 5 mg by mouth 2 (two) times daily. 08/12/20   [provider]  diclofenac Sodium (VOLTAREN) 1 % GEL APPLY 2 GRAMS EXTERNALLY TO THE AFFECTED AREA TWICE DAILY AS NEEDED FOR PAIN Patient taking differently: Apply 2 g topically 2 (two) times daily as needed (pain). APPLY 2 GRAMS EXTERNALLY TO THE AFFECTED AREA TWICE DAILY AS NEEDED FOR PAIN 06/16/19   Campbell Riches, MD  EPINEPHrine 0.3 mg/0.3 mL IJ SOAJ injection Inject 0.3 mg into the muscle as needed for anaphylaxis. 10/18/20   Biagio Borg, MD  gabapentin (NEURONTIN) 300 MG capsule Take 1 capsule (300 mg total) by mouth 4 (four) times daily as needed (pain). 05/04/20   Dohmeier, Asencion Partridge, MD  Glucos-Chond-Hyal Ac-Ca Fructo (MOVE FREE JOINT HEALTH ADVANCE PO) Take 1 tablet by mouth daily.    [provider]  hydrOXYzine (ATARAX/VISTARIL) 25 MG tablet Take 1 tablet (25 mg total) by mouth at bedtime. 05/24/20   Dohmeier, Asencion Partridge, MD  labetalol (NORMODYNE) 200 MG tablet Take 0.5 tablets (100 mg total) by mouth 2 (two) times daily. 12/10/20   Patwardhan, Reynold Bowen, MD  methocarbamol (ROBAXIN) 500 MG tablet Take 500 mg by mouth 4 (four) times  daily.    [provider]  Multiple Vitamin (MULTIVITAMIN WITH MINERALS) TABS tablet Take 1 tablet by mouth daily after breakfast.     [provider]  mupirocin ointment (BACTROBAN) 2 % APPLY  EXTERNALLY TO THE AFFECTED AREA TWICE DAILY FOR 3 WEEKS, PLACE COTTON BETWEEN TOES TO ALLOW BETTER AERATION AS NEEDED 03/24/20   Campbell Riches, MD  omeprazole (PRILOSEC) 40 MG capsule Take 40 mg by mouth daily.    [provider]  OVER THE COUNTER MEDICATION Take 2 capsules by mouth daily. Zhou: thyroid supplement    [provider]  tizanidine (ZANAFLEX) 6 MG capsule Take 1 capsule (6 mg total) by mouth 3 (three) times daily. 06/15/20   Dohmeier, Asencion Partridge, MD  torsemide (DEMADEX) 20 MG tablet Take 10 mg by mouth See admin instructions. Tues Thurs and Sat    [provider]  Ubrogepant (UBRELVY) 50 MG TABS Take 1 tablet at the onset of migraine can repeat in 2 hours if needed. 11/04/20   Ward Givens, NP  zolpidem (AMBIEN) 5 MG tablet Take 1-2 tablets (5-10 mg total) by mouth at bedtime as needed for sleep. Patient not taking: Reported on 04/23/2019 12/21/16 05/19/19  Mignon Pine, DO    Allergies    Chocolate, Descovy [emtricitabine-tenofovir af], Genvoya [elviteg-cobic-emtricit-tenofaf], Morphine and related, Oxycodone-acetaminophen, Penicillins, Sulfa antibiotics, Tomato, Vancomycin, Carvedilol, Flagyl [metronidazole], Milk-related compounds, Toradol [ketorolac tromethamine], and Tramadol  Review of Systems   Review of Systems  Constitutional:  Negative for chills and fever.  HENT:  Negative for ear pain and sore throat.   Eyes:  Negative for pain and visual disturbance.  Respiratory:  Negative for cough and shortness of breath.   Cardiovascular:  Negative for chest pain and palpitations.  Gastrointestinal:  Positive for abdominal pain (right lower) and nausea. Negative for vomiting.  Genitourinary:  Positive for dysuria and flank pain. Negative for  hematuria.  Musculoskeletal:  Negative for arthralgias and back pain.  Skin:  Negative for color change and rash.  Neurological:  Negative for seizures, syncope and headaches.  All other systems reviewed and are negative.  Physical Exam Updated Vital Signs BP (!) 142/125   Pulse 70   Temp 98.9 F (37.2 C)   Resp 18   SpO2 95%   Physical Exam Vitals and nursing note reviewed.  Constitutional:      Appearance: Normal appearance.     Comments: Appears to be in pain  HENT:     Head: Normocephalic and atraumatic.  Eyes:     Conjunctiva/sclera: Conjunctivae normal.  Pulmonary:     Effort: Pulmonary effort is normal. No respiratory distress.  Abdominal:     General: There is no distension.     Tenderness: There is no abdominal tenderness. There is right CVA tenderness. There is no guarding.  Musculoskeletal:        General: No deformity. Normal range of motion.     Cervical back: Normal range of motion.  Skin:    General: Skin is warm and dry.  Neurological:     General: No focal deficit present.     Mental Status: He is alert and oriented to person, place, and time. Mental status is at baseline.  Psychiatric:        Mood and Affect: Mood normal.    ED Results / Procedures / Treatments   Labs (all labs ordered are listed, but only abnormal results are displayed) Labs Reviewed  COMPREHENSIVE METABOLIC PANEL - Abnormal; Notable for the following components:      Result Value   Glucose, Bld 114 (*)    Creatinine, Ser 1.38 (*)    GFR, Estimated 60 (*)    All other components within normal limits  CBC WITH DIFFERENTIAL/PLATELET - Abnormal; Notable for the following components:   RBC 4.16 (*)    All other components within normal limits  URINALYSIS, ROUTINE W REFLEX MICROSCOPIC - Abnormal; Notable for the following components:   APPearance CLOUDY (*)    Hgb urine dipstick SMALL (*)    Protein, ur 100 (*)    Leukocytes,Ua LARGE (*)    WBC, UA >50 (*)    All other  components within normal limits  URINE CULTURE    EKG None  Radiology CT Renal Stone Study  Result Date: 12/18/2020 CLINICAL DATA:  60 year old male with right flank pain acute onset this morning. EXAM: CT ABDOMEN AND PELVIS WITHOUT CONTRAST TECHNIQUE: Multidetector CT imaging of the abdomen and pelvis was performed following the standard protocol without IV contrast. COMPARISON:  CT Abdomen and Pelvis 07/15/2019 and earlier. FINDINGS: Lower chest: Negative. Hepatobiliary: Chronically absent gallbladder. Negative noncontrast liver. Pancreas: Negative. Spleen: Negative. Adrenals/Urinary Tract: Small round right adrenal gland nodule measuring 16 mm is stable since 2015 consistent with small benign adenoma. Normal left adrenal. A chronic right renal midpole cyst has increased by about 5 mm since January 2021, and where previously simple fluid density there is now a small crescent-shaped area of hyperdensity along the inferior margin best seen on coronal image 78. Compare to series 4, image 75 in 2021. No perinephric stranding. Other chronic small bilateral renal cysts have not significantly changed since 2015. No hydronephrosis. No nephrolithiasis. Both ureters are decompressed and normal to the bladder. Chronic pelvic phleboliths. Decompressed and unremarkable bladder. Stomach/Bowel: Mild to moderate diverticulosis at the junction of the descending and sigmoid colon with no active inflammation. Similar retained stool in the colon to prior studies. Normal appendix is visible on series 3, image 54. Negative terminal ileum. No dilated small bowel. Chronic rectus muscle diastasis with no herniated bowel. But there are two small fat containing paraumbilical hernias, stable since 2018. Decompressed stomach and duodenum. No free air or free fluid. Vascular/Lymphatic: Normal caliber abdominal aorta. No calcified atherosclerosis. Reproductive: Negative. Other: No pelvic free fluid. Musculoskeletal: Chronic mid lumbar  disc and endplate degeneration with vacuum disc. A small lucent area in the left iliac wing on series 3, image 51 is stable since 2018 and therefore benign. No acute osseous abnormality identified. IMPRESSION: 1. Suggestion of small volume hemorrhage within a chronic right renal midpole cyst. No pararenal extension or other complicating features. Recommend follow-up with Urology. 2. No superimposed urinary calculus or obstructive uropathy. And no other acute or inflammatory process identified in the non-contrast abdomen or pelvis. Normal appendix. 3. Small benign right adrenal adenoma. Electronically Signed   By: Genevie Ann M.D.   On: 12/18/2020 08:13    Procedures Procedures   Medications Ordered in ED Medications  HYDROmorphone (DILAUDID) injection 1 mg (has no administration in time range)  ondansetron (ZOFRAN) injection 4 mg (has no administration in time range)    ED Course  I have reviewed the triage vital signs and the nursing notes.  Pertinent labs & imaging results that were available during my care of the patient were reviewed by me and considered in my medical decision making (see chart for details).    MDM Rules/Calculators/A&P                          Casey Brennan has multiple chronic medical conditions and presents with flank pain.  He was found to have a hemorrhagic renal cyst.  No evidence  of stone.  Urinalysis is somewhat equivocal but appears concerning for infection.  He will be given antibiotics and pain medication.  I have asked him to follow-up with his urologist. Final Clinical Impression(s) / ED Diagnoses Final diagnoses:  Renal cyst, native, hemorrhage  Acute cystitis with hematuria    Rx / DC Orders ED Discharge Orders          Ordered    nitrofurantoin, macrocrystal-monohydrate, (MACROBID) 100 MG capsule  2 times daily        12/18/20 1527    HYDROmorphone (DILAUDID) 2 MG tablet  Every 6 hours PRN        12/18/20 1529             Arnaldo Natal,  MD 12/18/20 1535

## 2020-12-18 NOTE — ED Provider Notes (Signed)
Emergency Medicine Provider Triage Evaluation Note  Casey Brennan , a 60 y.o. male  was evaluated in triage.  Pt complains of right flank pain that began about 1 hr PTA. Feels like prior kidney stone. Pain radiates to groin, no Brennan/v or f/c. No hematuria. Called his urologist Dr. Posey Pronto who recommended ED visit.  Review of Systems  Positive: Flank pain Negative: Fever, Brennan/v  Physical Exam  BP (!) 154/109 (BP Location: Left Arm)   Pulse 73   Temp 99.2 F (37.3 C) (Oral)   Resp 18   SpO2 95%  Gen:   Awake, appears uncomfortable Resp:  Normal effort  MSK:   Moves extremities without difficulty    Medical Decision Making  Medically screening exam initiated at 7:20 AM.  Appropriate orders placed.  Casey Brennan was informed that the remainder of the evaluation will be completed by another provider, this initial triage assessment does not replace that evaluation, and the importance of remaining in the ED until their evaluation is complete.  Hx stones, feels the same. Labs, U/a and stone study ordered.   Casey Brennan, Casey N, PA-C 12/18/20 9741    Casey Dessert, MD 12/18/20 1059

## 2020-12-19 LAB — URINE CULTURE

## 2020-12-21 DIAGNOSIS — R519 Headache, unspecified: Secondary | ICD-10-CM | POA: Diagnosis not present

## 2020-12-21 DIAGNOSIS — M542 Cervicalgia: Secondary | ICD-10-CM | POA: Diagnosis not present

## 2020-12-21 DIAGNOSIS — M4322 Fusion of spine, cervical region: Secondary | ICD-10-CM | POA: Diagnosis not present

## 2020-12-22 DIAGNOSIS — N281 Cyst of kidney, acquired: Secondary | ICD-10-CM | POA: Diagnosis not present

## 2020-12-22 DIAGNOSIS — N3000 Acute cystitis without hematuria: Secondary | ICD-10-CM | POA: Diagnosis not present

## 2020-12-22 DIAGNOSIS — N23 Unspecified renal colic: Secondary | ICD-10-CM | POA: Diagnosis not present

## 2020-12-23 ENCOUNTER — Ambulatory Visit: Payer: Medicare Other

## 2020-12-23 DIAGNOSIS — I10 Essential (primary) hypertension: Secondary | ICD-10-CM

## 2020-12-23 DIAGNOSIS — N281 Cyst of kidney, acquired: Secondary | ICD-10-CM

## 2020-12-24 DIAGNOSIS — Z9889 Other specified postprocedural states: Secondary | ICD-10-CM | POA: Diagnosis not present

## 2020-12-27 ENCOUNTER — Other Ambulatory Visit: Payer: Self-pay | Admitting: Internal Medicine

## 2020-12-27 ENCOUNTER — Encounter: Payer: Self-pay | Admitting: Internal Medicine

## 2020-12-27 ENCOUNTER — Ambulatory Visit (INDEPENDENT_AMBULATORY_CARE_PROVIDER_SITE_OTHER): Payer: Medicare Other | Admitting: Internal Medicine

## 2020-12-27 ENCOUNTER — Other Ambulatory Visit: Payer: Self-pay

## 2020-12-27 VITALS — BP 120/82 | HR 72 | Temp 98.0°F | Ht 70.0 in | Wt 227.0 lb

## 2020-12-27 DIAGNOSIS — E782 Mixed hyperlipidemia: Secondary | ICD-10-CM

## 2020-12-27 DIAGNOSIS — E538 Deficiency of other specified B group vitamins: Secondary | ICD-10-CM

## 2020-12-27 DIAGNOSIS — Z125 Encounter for screening for malignant neoplasm of prostate: Secondary | ICD-10-CM | POA: Diagnosis not present

## 2020-12-27 DIAGNOSIS — Z0001 Encounter for general adult medical examination with abnormal findings: Secondary | ICD-10-CM

## 2020-12-27 DIAGNOSIS — J4530 Mild persistent asthma, uncomplicated: Secondary | ICD-10-CM | POA: Insufficient documentation

## 2020-12-27 DIAGNOSIS — I10 Essential (primary) hypertension: Secondary | ICD-10-CM

## 2020-12-27 DIAGNOSIS — J301 Allergic rhinitis due to pollen: Secondary | ICD-10-CM | POA: Insufficient documentation

## 2020-12-27 DIAGNOSIS — Z23 Encounter for immunization: Secondary | ICD-10-CM | POA: Diagnosis not present

## 2020-12-27 DIAGNOSIS — J309 Allergic rhinitis, unspecified: Secondary | ICD-10-CM | POA: Insufficient documentation

## 2020-12-27 DIAGNOSIS — T783XXA Angioneurotic edema, initial encounter: Secondary | ICD-10-CM | POA: Insufficient documentation

## 2020-12-27 DIAGNOSIS — E559 Vitamin D deficiency, unspecified: Secondary | ICD-10-CM | POA: Diagnosis not present

## 2020-12-27 DIAGNOSIS — N179 Acute kidney failure, unspecified: Secondary | ICD-10-CM

## 2020-12-27 DIAGNOSIS — Z91011 Allergy to milk products: Secondary | ICD-10-CM | POA: Insufficient documentation

## 2020-12-27 LAB — URINALYSIS, ROUTINE W REFLEX MICROSCOPIC
Bilirubin Urine: NEGATIVE
Ketones, ur: NEGATIVE
Nitrite: NEGATIVE
Specific Gravity, Urine: 1.02 (ref 1.000–1.030)
Total Protein, Urine: 100 — AB
Urine Glucose: NEGATIVE
Urobilinogen, UA: 0.2 (ref 0.0–1.0)
pH: 6 (ref 5.0–8.0)

## 2020-12-27 LAB — CBC WITH DIFFERENTIAL/PLATELET
Basophils Absolute: 0.1 10*3/uL (ref 0.0–0.1)
Basophils Relative: 0.9 % (ref 0.0–3.0)
Eosinophils Absolute: 0.3 10*3/uL (ref 0.0–0.7)
Eosinophils Relative: 4 % (ref 0.0–5.0)
HCT: 39.3 % (ref 39.0–52.0)
Hemoglobin: 13 g/dL (ref 13.0–17.0)
Lymphocytes Relative: 44.4 % (ref 12.0–46.0)
Lymphs Abs: 3 10*3/uL (ref 0.7–4.0)
MCHC: 33.1 g/dL (ref 30.0–36.0)
MCV: 94.7 fl (ref 78.0–100.0)
Monocytes Absolute: 0.6 10*3/uL (ref 0.1–1.0)
Monocytes Relative: 9 % (ref 3.0–12.0)
Neutro Abs: 2.8 10*3/uL (ref 1.4–7.7)
Neutrophils Relative %: 41.7 % — ABNORMAL LOW (ref 43.0–77.0)
Platelets: 291 10*3/uL (ref 150.0–400.0)
RBC: 4.15 Mil/uL — ABNORMAL LOW (ref 4.22–5.81)
RDW: 13.5 % (ref 11.5–15.5)
WBC: 6.8 10*3/uL (ref 4.0–10.5)

## 2020-12-27 LAB — BASIC METABOLIC PANEL
BUN: 11 mg/dL (ref 6–23)
CO2: 28 mEq/L (ref 19–32)
Calcium: 9.5 mg/dL (ref 8.4–10.5)
Chloride: 103 mEq/L (ref 96–112)
Creatinine, Ser: 1.25 mg/dL (ref 0.40–1.50)
GFR: 63.88 mL/min (ref >60.00)
Glucose, Bld: 95 mg/dL (ref 70–99)
Potassium: 4.1 mEq/L (ref 3.5–5.1)
Sodium: 138 mEq/L (ref 135–145)

## 2020-12-27 LAB — HEPATIC FUNCTION PANEL
ALT: 20 U/L (ref 0–53)
AST: 18 U/L (ref 0–37)
Albumin: 4.2 g/dL (ref 3.5–5.2)
Alkaline Phosphatase: 108 U/L (ref 39–117)
Bilirubin, Direct: 0.1 mg/dL (ref 0.0–0.3)
Total Bilirubin: 0.7 mg/dL (ref 0.2–1.2)
Total Protein: 7.1 g/dL (ref 6.0–8.3)

## 2020-12-27 LAB — PSA: PSA: 0 ng/mL — ABNORMAL LOW (ref 0.10–4.00)

## 2020-12-27 LAB — VITAMIN B12: Vitamin B-12: >1550 pg/mL — ABNORMAL HIGH (ref 211–911)

## 2020-12-27 LAB — LIPID PANEL
Cholesterol: 183 mg/dL (ref 0–200)
HDL: 33.1 mg/dL — ABNORMAL LOW (ref >39.00)
LDL Cholesterol: 123 mg/dL — ABNORMAL HIGH (ref 0–99)
NonHDL: 149.6
Total CHOL/HDL Ratio: 6
Triglycerides: 133 mg/dL (ref 0.0–149.0)
VLDL: 26.6 mg/dL (ref 0.0–40.0)

## 2020-12-27 LAB — VITAMIN D 25 HYDROXY (VIT D DEFICIENCY, FRACTURES): VITD: 21.81 ng/mL — ABNORMAL LOW (ref 30.00–100.00)

## 2020-12-27 LAB — TSH: TSH: 1.63 u[IU]/mL (ref 0.35–4.50)

## 2020-12-27 NOTE — Patient Instructions (Addendum)
You had the Shingles shot #1 today  Please return in 2 - 6 months for Shingles shot #2  Please continue all other medications as before, and refills have been done if requested.  Please have the pharmacy call with any other refills you may need.  Please continue your efforts at being more active, low cholesterol diet, and weight control.  You are otherwise up to date with prevention measures today.  Please keep your appointments with your specialists as you may have planned  Please go to the LAB at the blood drawing area for the tests to be done  You will be contacted by phone if any changes need to be made immediately.  Otherwise, you will receive a letter about your results with an explanation, but please check with MyChart first.  Please remember to sign up for MyChart if you have not done so, as this will be important to you in the future with finding out test results, communicating by private email, and scheduling acute appointments online when needed.  Please make an Appointment to return in 6 months, or sooner if needed

## 2020-12-27 NOTE — Assessment & Plan Note (Signed)
Last vitamin D Lab Results  Component Value Date   VD25OH 21.81 (L) 12/27/2020   Low to start oral replacement

## 2020-12-27 NOTE — Progress Notes (Signed)
Patient ID: Casey Brennan, male   DOB: 1960-07-28, 60 y.o.   MRN: [id number]         Chief Complaint:: wellness exam and Follow-up  Hld, htn, low vit d, recent right renal cyst rupture, UTI, AKI       HPI:  Casey Brennan is a 60 y.o. male here for wellness exam; due for shingrix, ow up to date with prevnetive referrals and immunizations                        Also had episode right renal cyst rupture and infection with severe HTN, now improved having seen urology 6.18, tx with nitrofurantoin for infecitious.  Lab that day c/w mild worsening AKI as well.  Today Denies urinary symptoms such as dysuria, frequency, urgency, flank pain, hematuria or n/v, fever, chills.  Pt denies chest pain, increased sob or doe, wheezing, orthopnea, PND, increased LE swelling, palpitations, dizziness or syncope.  Pt denies polydipsia, polyuria, or new focal neuro s/s.   Pt denies fever, wt loss, night sweats, loss of appetite, or other constitutional symptoms  No other new complaints      Wt Readings from Last 3 Encounters:  12/27/20 227 lb (103 kg)  12/10/20 231 lb (104.8 kg)  11/11/20 232 lb (105.2 kg)   BP Readings from Last 3 Encounters:  12/27/20 120/82  12/18/20 138/86  12/10/20 136/89   Immunization History  Administered Date(s) Administered   Hepatitis A 06/12/2011   Influenza Split 06/12/2012   Influenza Whole 04/19/2009, 07/25/2010, 02/22/2011   Influenza,inj,Quad PF,6+ Mos 06/04/2013, 04/27/2014, 07/21/2015, 05/22/2016, 04/27/2017, 05/20/2020   Influenza-Unspecified 01/30/2015   Meningococcal Mcv4o 05/22/2016   PFIZER(Purple Top)SARS-COV-2 Vaccination 10/23/2019, 11/13/2019, 05/25/2020   PPD Test 05/05/2009   Pneumococcal Conjugate-13 11/11/2020   Pneumococcal Polysaccharide-23 03/22/2009, 08/19/2013, 11/15/2020   Tdap 06/12/2011   Zoster Recombinat (Shingrix) 12/27/2020   There are no preventive care reminders to display for this patient.     Past Medical History:  Diagnosis Date    Arthritis    r knee    Asthma    very rare   Axillary lymphadenopathy    Bronchitis    Chronic anemia    normocytic   Chronic folliculitis    Elevated PSA    Environmental and seasonal allergies    GERD (gastroesophageal reflux disease)    Patient denies   Gross hematuria    H/O pericarditis    2010--  myopercarditis--  resolved   HCAP (healthcare-associated pneumonia) 11/19/2014   Headache(784.0)    HX SEVERE FRONTAL HA'S   Hiatal hernia    History of concussion    2012  &  2013  RESIDUAL HA'S --  RESOLVED   History of gastric ulcer    History of kidney stones    History of MRSA infection 2010   infected boil   HIV (human immunodeficiency virus infection) ([place]) 1988   Hypertension    Internal hemorrhoids    Lytic bone lesion of hip    WORK-UP DONE BY ONCOLOGIST DR HA --  NOT MALIGNANT   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   Pneumonia    hx of   Post concussion syndrome    resolved   Prostate cancer ([place]) 04/25/13   gleason 3+3=6, volume 30 gm   Sinusitis, chronic 05/14/2015   Ulcer    hx of gastric   Wears glasses    Past Surgical History:  Procedure Laterality Date   ACDCF  09/29/2020   ANTERIOR CERVICAL DECOMPRESSION/DISCECTOMY FUSION 4 LEVELS N/A 09/29/2020   Procedure: ANTERIOR CERVICAL DECOMPRESSION FUSION CERVICAL 3-CERVICAL 4 , CERVICAL 4 - CERVICAL 5, CERVICAL 5 - CERVICAL 6 WITH INSTRUMENTATION AND ALLOGRAFT;  Surgeon: Phylliss Bob, MD;  Location: [place];  Service: Orthopedics;  Laterality: N/A;   CARDIAC CATHETERIZATION  03-23-2009  DR COOPER   NORMAL CORONARY ARTERIES   CHOLECYSTECTOMY N/A 11/06/2014   Procedure: LAPAROSCOPIC CHOLECYSTECTOMY WITH INTRAOPERATIVE CHOLANGIOGRAM;  Surgeon: Judeth Horn, MD;  Location: [place];  Service: General;  Laterality: N/A;   COLONOSCOPY  12/26/2011   Procedure: COLONOSCOPY;  Surgeon: Lear Ng, MD;  Location[facility];  Service: Endoscopy;  Laterality: N/A;   COLONOSCOPY     COLONOSCOPY WITH PROPOFOL  N/A 07/29/2014   Procedure: COLONOSCOPY WITH PROPOFOL;  Surgeon: Lear Ng, MD;  Location: [place];  Service: Endoscopy;  Laterality: N/A;   CYSTOSCOPY N/A 05/18/2018   Procedure: CYSTOSCOPY REMOVAL FOREIGN BODY;  Surgeon: Kathie Rhodes, MD;  Location[facility];  Service: Urology;  Laterality: N/A;   CYSTOSCOPY/RETROGRADE/URETEROSCOPY Bilateral 04/25/2013   Procedure: CYSTOSCOPY/ BILATERAL RETROGRADES; BLADDER BIOPSIES;  Surgeon: Alexis Frock, MD;  Location: [facility];  Service: Urology;  Laterality: Bilateral;   DENTAL EXAMINATION UNDER ANESTHESIA     ESOPHAGOGASTRODUODENOSCOPY  12/26/2011   Procedure: ESOPHAGOGASTRODUODENOSCOPY (EGD);  Surgeon: Lear Ng, MD;  Location[facility];  Service: Endoscopy;  Laterality: N/A;   ESOPHAGOGASTRODUODENOSCOPY (EGD) WITH PROPOFOL N/A 07/29/2014   Procedure: ESOPHAGOGASTRODUODENOSCOPY (EGD) WITH PROPOFOL;  Surgeon: Lear Ng, MD;  Location: [place];  Service: Endoscopy;  Laterality: N/A;   EXCISION CHRONIC LEFT BREAST ABSCESS  09-21-2010   EXCISIONAL BX LEFT BREAST MASS/  I  &  D LEFT BREAST ABSCESS  03-24-2009   KNEE ARTHROSCOPY Right 1985   left axilla biopsy  04/2013   left hip biopsy  10/2012   LYMPHADENECTOMY Bilateral 08/18/2013   Procedure: LYMPHADENECTOMY "PELVIC LYMPH NODE DISSECTION";  Surgeon: Alexis Frock, MD;  Location[facility];  Service: Urology;  Laterality: Bilateral;   PROSTATE BIOPSY N/A 04/25/2013   Procedure: BIOPSY TRANSRECTAL ULTRASONIC PROSTATE (TUBP);  Surgeon: Alexis Frock, MD;  Location[facility];  Service: Urology;  Laterality: N/A;   ROBOT ASSISTED LAPAROSCOPIC RADICAL PROSTATECTOMY N/A 08/18/2013   Procedure: ROBOTIC ASSISTED LAPAROSCOPIC RADICAL PROSTATECTOMY;  Surgeon: Alexis Frock, MD;  [facility];  Service: Urology;  Laterality: N/A;   TOTAL KNEE ARTHROPLASTY Right 08/01/2016   08/17/16   TOTAL KNEE ARTHROPLASTY Right 08/01/2016   Procedure:  TOTAL KNEE ARTHROPLASTY;  Surgeon: Renette Butters, MD;  Location: [place];  Service: Orthopedics;  Laterality: Right;   UPPER GASTROINTESTINAL ENDOSCOPY     UPPER LEG SOFT TISSUE BIOPSY Left 2012   lthigh    reports that he quit smoking about 10 years ago. His smoking use included cigars and cigarettes. He has a 34.50 pack-year smoking history. He has never used smokeless tobacco. He reports previous alcohol use. He reports that he does not use drugs. family history includes Asthma in his father and sister; Cancer in his father and maternal uncle; Diabetes in his father; Hypertension in his sister; Stroke in his father. Allergies  Allergen Reactions   Chocolate Shortness Of Breath and Other (See Comments)    Asthma attack, welts   Descovy [Emtricitabine-Tenofovir Af] Shortness Of Breath    Nasuea, vomiiting, diarrhea, sob, whelps, throat closes   Genvoya [Elviteg-Cobic-Emtricit-Tenofaf] Shortness Of Breath    Nausea, vomitting, diarreha, sob, rash, throat closing  Morphine And Related Shortness Of Breath, Itching and Other (See Comments)     chest pain   Oxycodone-Acetaminophen Shortness Of Breath   Penicillins Shortness Of Breath and Rash    Has patient had a PCN reaction causing immediate rash, facial/tongue/throat swelling, SOB or lightheadedness with hypotension: Yes Has patient had a PCN reaction causing severe rash involving mucus membranes or skin necrosis: No Has patient had a PCN reaction that required hospitalization No Has patient had a PCN reaction occurring within the last 10 years: Yes If all of the above answers are "NO", then may proceed with Cephalosporin use.   Sulfa Antibiotics Anaphylaxis and Other (See Comments)   Tomato Shortness Of Breath   Vancomycin Shortness Of Breath and Other (See Comments)   Carvedilol     LEG SWELLING, DIZZNESS   Flagyl [Metronidazole] Itching   Milk-Related Compounds    Morphine Other (See Comments)   Penicillin G Other (See Comments)    Toradol [Ketorolac Tromethamine] Nausea And Vomiting    itching   Acetaminophen Rash and Other (See Comments)    Pt tolerates percocet and also plain APAP without rash   Tramadol Itching, Nausea And Vomiting and Other (See Comments)   Current Outpatient Medications on File Prior to Visit  Medication Sig Dispense Refill   albuterol (PROVENTIL) (2.5 MG/3ML) 0.083% nebulizer solution Take 3 mLs (2.5 mg total) by nebulization every 6 (six) hours as needed for wheezing or shortness of breath. 75 mL 3   albuterol (VENTOLIN HFA) 108 (90 Base) MCG/ACT inhaler INHALE 2 PUFFS INTO THE LUNGS EVERY 6 HOURS AS NEEDED FOR WHEEZING OR SHORTNESS OF BREATH 36 g 0   ALPRAZolam (XANAX) 0.5 MG tablet TAKE 1 TABLET(0.5 MG) BY MOUTH TWICE DAILY AS NEEDED FOR SLEEP 180 tablet 0   Beclomethasone Dipropionate (QNASL) 80 MCG/ACT AERS Place 2 sprays into the nose daily. 10.6 g 0   bictegravir-emtricitabine-tenofovir AF (BIKTARVY) 50-200-25 MG TABS tablet Take 1 tablet by mouth daily. 30 tablet 11   Budeson-Glycopyrrol-Formoterol (BREZTRI AEROSPHERE) 160-9-4.8 MCG/ACT AERO Inhale 2 puffs into the lungs in the morning and at bedtime. 5.9 g 0   cetirizine (ZYRTEC) 10 MG tablet Take 1 tablet (10 mg total) by mouth daily. 90 tablet 3   cyanocobalamin (,VITAMIN B-12,) 1000 MCG/ML injection Inject 1 mL (1,000 mcg total) into the muscle every 30 (thirty) days. 3 mL 3   cyclobenzaprine (FLEXERIL) 5 MG tablet Take 5 mg by mouth 2 (two) times daily.     EPINEPHrine 0.3 mg/0.3 mL IJ SOAJ injection Inject 0.3 mg into the muscle as needed for anaphylaxis. 1 each 5   gabapentin (NEURONTIN) 300 MG capsule Take 1 capsule (300 mg total) by mouth 4 (four) times daily as needed (pain). 120 capsule 11   Glucos-Chond-Hyal Ac-Ca Fructo (MOVE FREE JOINT HEALTH ADVANCE PO) Take 1 tablet by mouth daily.     HYDROmorphone (DILAUDID) 2 MG tablet Take 1 tablet (2 mg total) by mouth every 6 (six) hours as needed for up to 6 doses for severe pain. 6  tablet 0   hydrOXYzine (ATARAX/VISTARIL) 25 MG tablet Take 1 tablet (25 mg total) by mouth at bedtime. 90 tablet 3   methocarbamol (ROBAXIN) 500 MG tablet Take 500 mg by mouth 4 (four) times daily.     metoprolol succinate (TOPROL-XL) 100 MG 24 hr tablet      Multiple Vitamin (MULTIVITAMIN WITH MINERALS) TABS tablet Take 1 tablet by mouth daily after breakfast.      mupirocin ointment (BACTROBAN)  2 % APPLY EXTERNALLY TO THE AFFECTED AREA TWICE DAILY FOR 3 WEEKS, PLACE COTTON BETWEEN TOES TO ALLOW BETTER AERATION AS NEEDED 66 g 1   omeprazole (PRILOSEC) 40 MG capsule Take 40 mg by mouth daily.     OVER THE COUNTER MEDICATION Take 2 capsules by mouth daily. Zhou: thyroid supplement     tizanidine (ZANAFLEX) 6 MG capsule Take 1 capsule (6 mg total) by mouth 3 (three) times daily. 90 capsule 11   torsemide (DEMADEX) 20 MG tablet Take 10 mg by mouth See admin instructions. Tues Thurs and Sat     Ubrogepant (UBRELVY) 50 MG TABS Take 1 tablet at the onset of migraine can repeat in 2 hours if needed. 15 tablet 3   diclofenac Sodium (VOLTAREN) 1 % GEL APPLY 2 GRAMS EXTERNALLY TO THE AFFECTED AREA TWICE DAILY AS NEEDED FOR PAIN (Patient taking differently: Apply 2 g topically 2 (two) times daily as needed (pain). APPLY 2 GRAMS EXTERNALLY TO THE AFFECTED AREA TWICE DAILY AS NEEDED FOR PAIN) 100 g 0   labetalol (NORMODYNE) 200 MG tablet Take 0.5 tablets (100 mg total) by mouth 2 (two) times daily. 60 tablet 3   montelukast (SINGULAIR) 10 MG tablet Take 10 mg by mouth daily.     nitrofurantoin, macrocrystal-monohydrate, (MACROBID) 100 MG capsule Take 1 capsule (100 mg total) by mouth 2 (two) times daily. 10 capsule 0   [DISCONTINUED] zolpidem (AMBIEN) 5 MG tablet Take 1-2 tablets (5-10 mg total) by mouth at bedtime as needed for sleep. (Patient not taking: Reported on 04/23/2019) 30 tablet 2   No current facility-administered medications on file prior to visit.        ROS:  All others reviewed and  negative.  Objective        PE:  BP 120/82 (BP Location: Left Arm, Patient Position: Sitting, Cuff Size: Normal)   Pulse 72   Temp 98 F (36.7 C) (Oral)   Ht 5\' 10"  (1.778 m)   Wt 227 lb (103 kg)   SpO2 95%   BMI 32.57 kg/m                 Constitutional: Pt appears in NAD               HENT: Head: NCAT.                Right Ear: External ear normal.                 Left Ear: External ear normal.                Eyes: . Pupils are equal, round, and reactive to light. Conjunctivae and EOM are normal               Nose: without d/c or deformity               Neck: Neck supple. Gross normal ROM               Cardiovascular: Normal rate and regular rhythm.                 Pulmonary/Chest: Effort normal and breath sounds without rales or wheezing.                Abd:  Soft, NT, ND, + BS, no organomegaly               Neurological: Pt is alert. At baseline orientation, motor grossly intact  Skin: Skin is warm. No rashes, no other new lesions, LE edema - none               Psychiatric: Pt behavior is normal without agitation   Micro: none  Cardiac tracings I have personally interpreted today:  none  Pertinent Radiological findings (summarize): none   Lab Results  Component Value Date   WBC 6.8 12/27/2020   HGB 13.0 12/27/2020   HCT 39.3 12/27/2020   PLT 291.0 12/27/2020   GLUCOSE 95 12/27/2020   CHOL 183 12/27/2020   TRIG 133.0 12/27/2020   HDL 33.10 (L) 12/27/2020   LDLCALC 123 (H) 12/27/2020   ALT 20 12/27/2020   AST 18 12/27/2020   NA 138 12/27/2020   K 4.1 12/27/2020   CL 103 12/27/2020   CREATININE 1.25 12/27/2020   BUN 11 12/27/2020   CO2 28 12/27/2020   TSH 1.63 12/27/2020   PSA 0.00 Repeated and verified X2. (L) 12/27/2020   INR 1.0 09/27/2020   HGBA1C (L) 10/10/2010    4.3 (NOTE)                                                                       According to the ADA Clinical Practice Recommendations for 2011, when HbA1c is used as a screening  test:   >=6.5%   Diagnostic of Diabetes Mellitus           (if abnormal result  is confirmed)  5.7-6.4%   Increased risk of developing Diabetes Mellitus  References:Diagnosis and Classification of Diabetes Mellitus,Diabetes ENMM,7680,88(PJSRP 1):S62-S69 and Standards of Medical Care in         Diabetes - 2011,Diabetes Care,2011,34  (Suppl 1):S11-S61.   MICROALBUR 16.88 (H) 10/05/2011   Assessment/Plan:  Casey Brennan is a 60 y.o. American [place] or [place] Native [3] Asian [4] Black or African American [2] White or Caucasian [1] male with  has a past medical history of Arthritis, Asthma, Axillary lymphadenopathy, Bronchitis, Chronic anemia, Chronic folliculitis, Elevated PSA, Environmental and seasonal allergies, GERD (gastroesophageal reflux disease), Gross hematuria, H/O pericarditis, HCAP (healthcare-associated pneumonia) (11/19/2014), Headache(784.0), Hiatal hernia, History of concussion, History of gastric ulcer, History of kidney stones, History of MRSA infection (2010), HIV (human immunodeficiency virus infection) ([place]) (1988), Hypertension, Internal hemorrhoids, Lytic bone lesion of hip, 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  Vitamin D deficiency Last vitamin D Lab Results  Component Value Date   VD25OH 21.81 (L) 12/27/2020   Low to start oral replacement   Encounter for well adult exam with abnormal findings Age and sex appropriate education and counseling updated with regular exercise and diet Referrals for preventative services - none needed Immunizations addressed - for shingles shot #1 today and f/u 2 mo for #2 Smoking counseling  - none needed Evidence for depression or other mood disorder - none significant Most recent labs reviewed. I have personally reviewed and have noted: 1) the patient's medical and social history 2) The patient's current  medications and supplements 3) The patient's height, weight, and BMI have been recorded in the chart   Mixed hyperlipidemia Lab Results  Component Value Date   LDLCALC 123 (H) 12/27/2020  Goal ldl < 100, uncontrolled, pt to continue current low chol diet, declines statin for now    Primary hypertension Severe recently now improved BP Readings from Last 3 Encounters:  12/27/20 120/82  12/18/20 138/86  12/10/20 136/89   Cont same tx - labetolol   AKI (acute kidney injury) ([place]) Mild, now s/p right renal cyst and uti tx, for f/u lab  Followup: Return in about 6 months (around 06/28/2021).  Cathlean Cower, MD 12/29/2020 9:43 PM [facility]

## 2020-12-28 ENCOUNTER — Encounter: Payer: Self-pay | Admitting: Internal Medicine

## 2020-12-28 ENCOUNTER — Encounter: Payer: Self-pay | Admitting: Neurology

## 2020-12-29 ENCOUNTER — Encounter: Payer: Self-pay | Admitting: Internal Medicine

## 2020-12-29 DIAGNOSIS — N179 Acute kidney failure, unspecified: Secondary | ICD-10-CM | POA: Insufficient documentation

## 2020-12-29 NOTE — Assessment & Plan Note (Signed)
Lab Results  Component Value Date   LDLCALC 123 (H) 12/27/2020   Goal ldl < 100, uncontrolled, pt to continue current low chol diet, declines statin for now

## 2020-12-29 NOTE — Assessment & Plan Note (Signed)
Severe recently now improved BP Readings from Last 3 Encounters:  12/27/20 120/82  12/18/20 138/86  12/10/20 136/89   Cont same tx - labetolol

## 2020-12-29 NOTE — Assessment & Plan Note (Signed)
Mild, now s/p right renal cyst and uti tx, for f/u lab

## 2020-12-29 NOTE — Telephone Encounter (Signed)
Since they are better- just monitor. Wearing socks may help. Ensure he is taking gabapentin as ordered. Certainly can increase medication or add compound cream if the feet start hurting more consistently

## 2020-12-29 NOTE — Assessment & Plan Note (Signed)
Age and sex appropriate education and counseling updated with regular exercise and diet Referrals for preventative services - none needed Immunizations addressed - for shingles shot #1 today and f/u 2 mo for #2 Smoking counseling  - none needed Evidence for depression or other mood disorder - none significant Most recent labs reviewed. I have personally reviewed and have noted: 1) the patient's medical and social history 2) The patient's current medications and supplements 3) The patient's height, weight, and BMI have been recorded in the chart

## 2021-01-03 ENCOUNTER — Other Ambulatory Visit: Payer: Self-pay

## 2021-01-03 DIAGNOSIS — J449 Chronic obstructive pulmonary disease, unspecified: Secondary | ICD-10-CM

## 2021-01-04 MED ORDER — QNASL 80 MCG/ACT NA AERS
2.0000 | INHALATION_SPRAY | Freq: Every day | NASAL | 2 refills | Status: DC
Start: 2021-01-04 — End: 2022-06-27

## 2021-01-04 MED ORDER — ALBUTEROL SULFATE HFA 108 (90 BASE) MCG/ACT IN AERS: 2.0000 | INHALATION_SPRAY | Freq: Four times a day (QID) | RESPIRATORY_TRACT | 0 refills | Status: DC | PRN

## 2021-01-04 MED ORDER — BREZTRI AEROSPHERE 160-9-4.8 MCG/ACT IN AERO: 2.0000 | INHALATION_SPRAY | Freq: Two times a day (BID) | RESPIRATORY_TRACT | 5 refills | Status: DC

## 2021-01-07 ENCOUNTER — Telehealth: Payer: Self-pay | Admitting: Pulmonary Disease

## 2021-01-07 NOTE — Telephone Encounter (Signed)
PA request was received from (pharmacy): [facility] on [address] [phone] Fax: [phone] Medication name and strength: Qnasl 20mcg Ordering Provider: RA  Was PA started with CMM?: Yes If yes, please enter KEY: [id number] Medication tried and failed: Nasonex, Flonase 61mcg, Afrin nasal spray and QC nasal relief sinus spray Covered Alternatives: N/A  Per CMM.com, PA will take up to 24-78 hours. Will check back later for updates.

## 2021-01-09 DIAGNOSIS — Z881 Allergy status to other antibiotic agents status: Secondary | ICD-10-CM | POA: Diagnosis not present

## 2021-01-09 DIAGNOSIS — Z880 Allergy status to penicillin: Secondary | ICD-10-CM | POA: Diagnosis not present

## 2021-01-09 DIAGNOSIS — X58XXXA Exposure to other specified factors, initial encounter: Secondary | ICD-10-CM | POA: Diagnosis not present

## 2021-01-13 ENCOUNTER — Other Ambulatory Visit: Payer: Self-pay | Admitting: Infectious Diseases

## 2021-01-13 ENCOUNTER — Telehealth: Payer: Self-pay | Admitting: Pulmonary Disease

## 2021-01-13 ENCOUNTER — Ambulatory Visit: Payer: Medicare Other | Admitting: Cardiology

## 2021-01-13 DIAGNOSIS — J449 Chronic obstructive pulmonary disease, unspecified: Secondary | ICD-10-CM

## 2021-01-13 MED ORDER — DOXYCYCLINE HYCLATE 100 MG PO TABS: 100.0000 mg | ORAL_TABLET | Freq: Two times a day (BID) | ORAL | 0 refills | Status: DC

## 2021-01-13 MED ORDER — PREDNISONE 10 MG PO TABS: ORAL_TABLET | ORAL | 0 refills | Status: AC

## 2021-01-13 NOTE — Telephone Encounter (Signed)
Pts sister stated that he has been having an increase of SOB and has been coughing up thick yellow mucus and/or thick clear foamy looking mucus. Stated that it is very concerning. Requesting for an antibiotic for the pt states that he is unable to take over the counter cough medicine because he is allergic he can only take Mucinex and it must be written by the doctor and states he has been taking the Mucinex tablets and Alka Selter but that is not working as well.  Pharmacy; [facility] [place], [place] [address]; [phone]

## 2021-01-13 NOTE — Telephone Encounter (Signed)
Rigoberto Noel, MD  Mariell Nester, Waldemar Dickens, CMA Caller: Unspecified (Today, 12:18 PM) Doxy 100 bid x 7ds   Prednisone 10 mg tabs Take 4 tabs  daily with food x 4 days, then 3 tabs daily x 4 days, then 2 tabs daily x 4 days, then 1 tab daily x4 days then stop. #40   Covid testing   OV if no better in 3 days & covid negative test     Called and spoke with April letting her know the info stated by Dr. Elsworth Soho and she verbalized understanding.  While speaking with April, she wants to know if pt can be prescribed Tussionex to help with his cough as that is the only thing that he can really take that will work due to all that he is allergic to.  Dr. Elsworth Soho, please advise.

## 2021-01-13 NOTE — Telephone Encounter (Signed)
Called and spoke with pt's sister April who stated about 3-4 days ago pt began having complaints of increased SOB, coughing with yellow phlegm, and also has had some rattling in his chest.  April stated that pt has gone back to work after he was cleared by Dr. Elsworth Soho and said that he just got back from [place] last night 7/13 after going there for his job and symptoms are worse since he has been  back due to the climate change.   April said with pt just sitting down, pt is having a lot of wheezing going on.  April stated that pt has been taking mucinex to see if that would help with his symptoms as well as taking alka seltzer.  Pt has had to use the rescue inhaler at least 6-7 times a day since his symptoms began. Pt is also using his breztri inhaler as prescribed. April said that pt is also using the nebulizer at least twice a day which has been helping.  Due to pt's symptoms, pt and April are wanting to know what can be recommended to help with his symptoms.  Dr. Elsworth Soho, please advise.

## 2021-01-13 NOTE — Telephone Encounter (Signed)
LMTCB for April on [facility]

## 2021-01-14 DIAGNOSIS — R8271 Bacteriuria: Secondary | ICD-10-CM | POA: Diagnosis not present

## 2021-01-14 DIAGNOSIS — R310 Gross hematuria: Secondary | ICD-10-CM | POA: Diagnosis not present

## 2021-01-20 NOTE — Telephone Encounter (Signed)
Called patient's sister but she did not answer. Left message for her to call back.

## 2021-01-23 ENCOUNTER — Other Ambulatory Visit: Payer: Self-pay | Admitting: Infectious Diseases

## 2021-01-23 DIAGNOSIS — J449 Chronic obstructive pulmonary disease, unspecified: Secondary | ICD-10-CM

## 2021-01-24 NOTE — Telephone Encounter (Signed)
Please advise on refill.

## 2021-01-27 DIAGNOSIS — R310 Gross hematuria: Secondary | ICD-10-CM | POA: Diagnosis not present

## 2021-01-27 NOTE — Telephone Encounter (Signed)
Called and left detailed message for pt to call back if he doesn't feel improved or gotten worse. Will close encounter, per triage protocol, as this has been the second attempt with no answer.

## 2021-01-28 ENCOUNTER — Other Ambulatory Visit: Payer: Self-pay | Admitting: Infectious Diseases

## 2021-01-28 DIAGNOSIS — J449 Chronic obstructive pulmonary disease, unspecified: Secondary | ICD-10-CM

## 2021-01-28 MED ORDER — ALBUTEROL SULFATE (2.5 MG/3ML) 0.083% IN NEBU: 2.5000 mg | INHALATION_SOLUTION | Freq: Four times a day (QID) | RESPIRATORY_TRACT | 3 refills | Status: DC | PRN

## 2021-01-28 MED ORDER — ALBUTEROL SULFATE HFA 108 (90 BASE) MCG/ACT IN AERS: 2.0000 | INHALATION_SPRAY | Freq: Four times a day (QID) | RESPIRATORY_TRACT | 3 refills | Status: DC | PRN

## 2021-02-04 ENCOUNTER — Other Ambulatory Visit: Payer: Self-pay | Admitting: Infectious Diseases

## 2021-02-04 DIAGNOSIS — B20 Human immunodeficiency virus [HIV] disease: Secondary | ICD-10-CM

## 2021-02-07 DIAGNOSIS — R310 Gross hematuria: Secondary | ICD-10-CM | POA: Diagnosis not present

## 2021-02-07 DIAGNOSIS — N281 Cyst of kidney, acquired: Secondary | ICD-10-CM | POA: Diagnosis not present

## 2021-02-07 DIAGNOSIS — K5730 Diverticulosis of large intestine without perforation or abscess without bleeding: Secondary | ICD-10-CM | POA: Diagnosis not present

## 2021-02-07 DIAGNOSIS — D3500 Benign neoplasm of unspecified adrenal gland: Secondary | ICD-10-CM | POA: Diagnosis not present

## 2021-02-10 ENCOUNTER — Ambulatory Visit: Payer: Medicare Other | Admitting: Infectious Diseases

## 2021-02-18 ENCOUNTER — Telehealth: Payer: Self-pay | Admitting: Internal Medicine

## 2021-02-18 NOTE — Telephone Encounter (Signed)
Left message for patient to call me back at [phone] to schedule Medicare Annual Wellness Visit   No hx of AWV eligible as of 12/31/13  Please schedule at anytime with [facility] Advisor if patient calls the office back.    98 Minutes appointment   Any questions, please call me at [phone]

## 2021-03-03 ENCOUNTER — Ambulatory Visit: Payer: PRIVATE HEALTH INSURANCE | Admitting: Infectious Diseases

## 2021-03-17 ENCOUNTER — Ambulatory Visit: Payer: PRIVATE HEALTH INSURANCE | Admitting: Infectious Diseases

## 2021-03-17 ENCOUNTER — Other Ambulatory Visit: Payer: Self-pay | Admitting: *Deleted

## 2021-03-17 ENCOUNTER — Ambulatory Visit: Payer: PRIVATE HEALTH INSURANCE

## 2021-03-17 MED ORDER — UBRELVY 50 MG PO TABS: ORAL_TABLET | ORAL | 3 refills | Status: DC

## 2021-03-21 ENCOUNTER — Other Ambulatory Visit: Payer: Self-pay | Admitting: Neurology

## 2021-03-21 DIAGNOSIS — G4700 Insomnia, unspecified: Secondary | ICD-10-CM

## 2021-03-21 DIAGNOSIS — L600 Ingrowing nail: Secondary | ICD-10-CM | POA: Diagnosis not present

## 2021-03-21 NOTE — Telephone Encounter (Signed)
Patient is due for a refill on Xanax.  Patient is up-to-date on his appointments. [place] Controlled Substance Registry checked and is appropriate.

## 2021-03-22 ENCOUNTER — Ambulatory Visit: Payer: PRIVATE HEALTH INSURANCE

## 2021-03-28 ENCOUNTER — Telehealth: Payer: Self-pay

## 2021-03-28 NOTE — Telephone Encounter (Signed)
Mrs. 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 She requested a virtual appt for next week.

## 2021-04-03 DIAGNOSIS — R051 Acute cough: Secondary | ICD-10-CM | POA: Diagnosis not present

## 2021-04-03 DIAGNOSIS — R5383 Other fatigue: Secondary | ICD-10-CM | POA: Diagnosis not present

## 2021-04-03 DIAGNOSIS — Z20822 Contact with and (suspected) exposure to covid-19: Secondary | ICD-10-CM | POA: Diagnosis not present

## 2021-04-03 DIAGNOSIS — R0981 Nasal congestion: Secondary | ICD-10-CM | POA: Diagnosis not present

## 2021-04-03 DIAGNOSIS — J988 Other specified respiratory disorders: Secondary | ICD-10-CM | POA: Diagnosis not present

## 2021-04-04 ENCOUNTER — Telehealth: Payer: PRIVATE HEALTH INSURANCE | Admitting: Internal Medicine

## 2021-04-04 DIAGNOSIS — M542 Cervicalgia: Secondary | ICD-10-CM | POA: Diagnosis not present

## 2021-04-04 DIAGNOSIS — L603 Nail dystrophy: Secondary | ICD-10-CM | POA: Diagnosis not present

## 2021-04-04 DIAGNOSIS — L600 Ingrowing nail: Secondary | ICD-10-CM | POA: Diagnosis not present

## 2021-04-05 ENCOUNTER — Ambulatory Visit (INDEPENDENT_AMBULATORY_CARE_PROVIDER_SITE_OTHER): Payer: PRIVATE HEALTH INSURANCE

## 2021-04-05 ENCOUNTER — Telehealth (INDEPENDENT_AMBULATORY_CARE_PROVIDER_SITE_OTHER): Payer: PRIVATE HEALTH INSURANCE | Admitting: Internal Medicine

## 2021-04-05 ENCOUNTER — Telehealth: Payer: Self-pay | Admitting: Internal Medicine

## 2021-04-05 ENCOUNTER — Other Ambulatory Visit: Payer: Self-pay

## 2021-04-05 ENCOUNTER — Encounter: Payer: Self-pay | Admitting: Internal Medicine

## 2021-04-05 VITALS — BP 128/70 | HR 71 | Temp 98.3°F | Ht 70.0 in | Wt 229.2 lb

## 2021-04-05 VITALS — BP 128/70 | HR 71 | Temp 98.3°F | Ht 70.0 in | Wt 229.0 lb

## 2021-04-05 DIAGNOSIS — R062 Wheezing: Secondary | ICD-10-CM

## 2021-04-05 DIAGNOSIS — R051 Acute cough: Secondary | ICD-10-CM

## 2021-04-05 DIAGNOSIS — R0600 Dyspnea, unspecified: Secondary | ICD-10-CM | POA: Diagnosis not present

## 2021-04-05 DIAGNOSIS — E559 Vitamin D deficiency, unspecified: Secondary | ICD-10-CM | POA: Diagnosis not present

## 2021-04-05 DIAGNOSIS — R059 Cough, unspecified: Secondary | ICD-10-CM | POA: Diagnosis not present

## 2021-04-05 DIAGNOSIS — E782 Mixed hyperlipidemia: Secondary | ICD-10-CM | POA: Diagnosis not present

## 2021-04-05 DIAGNOSIS — Z0000 Encounter for general adult medical examination without abnormal findings: Secondary | ICD-10-CM | POA: Diagnosis not present

## 2021-04-05 MED ORDER — HYDROCODONE BIT-HOMATROP MBR 5-1.5 MG/5ML PO SOLN: 5.0000 mL | Freq: Four times a day (QID) | ORAL | 0 refills | Status: AC | PRN

## 2021-04-05 MED ORDER — LEVOFLOXACIN 500 MG PO TABS: 500.0000 mg | ORAL_TABLET | Freq: Every day | ORAL | 0 refills | Status: AC

## 2021-04-05 MED ORDER — PREDNISONE 10 MG PO TABS: ORAL_TABLET | ORAL | 0 refills | Status: DC

## 2021-04-05 NOTE — Patient Instructions (Signed)
Please take all new medication as prescribed  - the antibiotic, cough medicine, and prednisone  Please take OTC Vitamin D3 at 2000 units per day, indefinitely, or 4000 units if you already take the 2000 units.    Please continue all other medications as before, and refills have been done if requested.  Please have the pharmacy call with any other refills you may need.  Please keep your appointments with your specialists as you may have planned  Please go to the XRAY Department in the first floor for the x-ray testing  You will be contacted by phone if any changes need to be made immediately.  Otherwise, you will receive a letter about your results with an explanation, but please check with MyChart first.  Please remember to sign up for MyChart if you have not done so, as this will be important to you in the future with finding out test results, communicating by private email, and scheduling acute appointments online when needed.

## 2021-04-05 NOTE — Progress Notes (Signed)
Patient ID: Casey Brennan, male   DOB: 01/27/1961, 60 y.o.   MRN: [id number]        Chief Complaint: follow up cough, wheezing, low vit d, hld       HPI:  Casey Brennan is a 60 y.o. male here with c/o Here with acute onset mild to mod 2-3 days ST, HA, general weakness and malaise, with prod cough greenish sputum, but Pt denies chest pain, increased sob or doe, wheezing, orthopnea, PND, increased LE swelling, palpitations, dizziness or syncope, except for mild wheezing starting yesterday.  Not taking Vit D.  Trying to follow lower chol diet, deos not want statin.   Pt denies polydipsia, polyuria, or new focal neuro s/s.         Wt Readings from Last 3 Encounters:  04/08/21 224 lb 9.6 oz (101.9 kg)  04/05/21 229 lb (103.9 kg)  04/05/21 229 lb 3.2 oz (104 kg)   BP Readings from Last 3 Encounters:  04/08/21 132/83  04/05/21 128/70  04/05/21 128/70         Past Medical History:  Diagnosis Date   Arthritis    r knee    Asthma    very rare   Axillary lymphadenopathy    Bronchitis    Chronic anemia    normocytic   Chronic folliculitis    Elevated PSA    Environmental and seasonal allergies    GERD (gastroesophageal reflux disease)    Patient denies   Gross hematuria    H/O pericarditis    2010--  myopercarditis--  resolved   HCAP (healthcare-associated pneumonia) 11/19/2014   Headache(784.0)    HX SEVERE FRONTAL HA'S   Hiatal hernia    History of concussion    2012  &  2013  RESIDUAL HA'S --  RESOLVED   History of gastric ulcer    History of kidney stones    History of MRSA infection 2010   infected boil   HIV (human immunodeficiency virus infection) ([place]) 1988   Hypertension    Internal hemorrhoids    Lytic bone lesion of hip    WORK-UP DONE BY ONCOLOGIST DR HA --  NOT MALIGNANT   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   Pneumonia    hx of   Post concussion syndrome    resolved   Prostate cancer ([place]) 04/25/13   gleason 3+3=6, volume 30 gm   Sinusitis, chronic  05/14/2015   Ulcer    hx of gastric   Wears glasses    Past Surgical History:  Procedure Laterality Date   ACDCF  09/29/2020   ANTERIOR CERVICAL DECOMPRESSION/DISCECTOMY FUSION 4 LEVELS N/A 09/29/2020   Procedure: ANTERIOR CERVICAL DECOMPRESSION FUSION CERVICAL 3-CERVICAL 4 , CERVICAL 4 - CERVICAL 5, CERVICAL 5 - CERVICAL 6 WITH INSTRUMENTATION AND ALLOGRAFT;  Surgeon: Phylliss Bob, MD;  Location: [facility];  Service: Orthopedics;  Laterality: N/A;   CARDIAC CATHETERIZATION  03-23-2009  DR COOPER   NORMAL CORONARY ARTERIES   CHOLECYSTECTOMY N/A 11/06/2014   Procedure: LAPAROSCOPIC CHOLECYSTECTOMY WITH INTRAOPERATIVE CHOLANGIOGRAM;  Surgeon: Judeth Horn, MD;  Location: [facility];  Service: General;  Laterality: N/A;   COLONOSCOPY  12/26/2011   Procedure: COLONOSCOPY;  Surgeon: Lear Ng, MD;  Location: [facility];  Service: Endoscopy;  Laterality: N/A;   COLONOSCOPY     COLONOSCOPY WITH PROPOFOL N/A 07/29/2014   Procedure: COLONOSCOPY WITH PROPOFOL;  Surgeon: Lear Ng, MD;  Location: [facility];  Service: Endoscopy;  Laterality: N/A;   CYSTOSCOPY N/A  05/18/2018   Procedure: CYSTOSCOPY REMOVAL FOREIGN BODY;  Surgeon: Kathie Rhodes, MD;  Location: [facility];  Service: Urology;  Laterality: N/A;   CYSTOSCOPY/RETROGRADE/URETEROSCOPY Bilateral 04/25/2013   Procedure: CYSTOSCOPY/ BILATERAL RETROGRADES; BLADDER BIOPSIES;  Surgeon: Alexis Frock, MD;  Location: [facility];  Service: Urology;  Laterality: Bilateral;   DENTAL EXAMINATION UNDER ANESTHESIA     ESOPHAGOGASTRODUODENOSCOPY  12/26/2011   Procedure: ESOPHAGOGASTRODUODENOSCOPY (EGD);  Surgeon: Lear Ng, MD;  Location: [facility];  Service: Endoscopy;  Laterality: N/A;   ESOPHAGOGASTRODUODENOSCOPY (EGD) WITH PROPOFOL N/A 07/29/2014   Procedure: ESOPHAGOGASTRODUODENOSCOPY (EGD) WITH PROPOFOL;  Surgeon: Lear Ng, MD;  Location: [place];  Service: Endoscopy;  Laterality: N/A;   EXCISION  CHRONIC LEFT BREAST ABSCESS  09-21-2010   EXCISIONAL BX LEFT BREAST MASS/  I  &  D LEFT BREAST ABSCESS  03-24-2009   KNEE ARTHROSCOPY Right 1985   left axilla biopsy  04/2013   left hip biopsy  10/2012   LYMPHADENECTOMY Bilateral 08/18/2013   Procedure: LYMPHADENECTOMY "PELVIC LYMPH NODE DISSECTION";  Surgeon: Alexis Frock, MD;  Location: [facility];  Service: Urology;  Laterality: Bilateral;   PROSTATE BIOPSY N/A 04/25/2013   Procedure: BIOPSY TRANSRECTAL ULTRASONIC PROSTATE (TUBP);  Surgeon: Alexis Frock, MD;  Location: [facility];  Service: Urology;  Laterality: N/A;   ROBOT ASSISTED LAPAROSCOPIC RADICAL PROSTATECTOMY N/A 08/18/2013   Procedure: ROBOTIC ASSISTED LAPAROSCOPIC RADICAL PROSTATECTOMY;  Surgeon: Alexis Frock, MD;  Location: [facility];  Service: Urology;  Laterality: N/A;   TOTAL KNEE ARTHROPLASTY Right 08/01/2016   08/17/16   TOTAL KNEE ARTHROPLASTY Right 08/01/2016   Procedure: TOTAL KNEE ARTHROPLASTY;  Surgeon: Renette Butters, MD;  Location: [place];  Service: Orthopedics;  Laterality: Right;   UPPER GASTROINTESTINAL ENDOSCOPY     UPPER LEG SOFT TISSUE BIOPSY Left 2012   lthigh    reports that he quit smoking about 10 years ago. His smoking use included cigars and cigarettes. He has a 34.50 pack-year smoking history. He has never used smokeless tobacco. He reports that he does not currently use alcohol. He reports that he does not use drugs. family history includes Asthma in his father and sister; Cancer in his father and maternal uncle; Diabetes in his father; Hypertension in his sister; Stroke in his father. Allergies  Allergen Reactions   Chocolate Shortness Of Breath and Other (See Comments)    Asthma attack, welts   Descovy [Emtricitabine-Tenofovir Af] Shortness Of Breath    Nasuea, vomiiting, diarrhea, sob, whelps, throat closes   Genvoya [Elviteg-Cobic-Emtricit-Tenofaf] Shortness Of Breath    Nausea, vomitting, diarreha, sob, rash, throat closing    Morphine And Related Shortness Of Breath, Itching and Other (See Comments)     chest pain   Oxycodone-Acetaminophen Shortness Of Breath   Penicillins Shortness Of Breath and Rash    Has patient had a PCN reaction causing immediate rash, facial/tongue/throat swelling, SOB or lightheadedness with hypotension: Yes Has patient had a PCN reaction causing severe rash involving mucus membranes or skin necrosis: No Has patient had a PCN reaction that required hospitalization No Has patient had a PCN reaction occurring within the last 10 years: Yes If all of the above answers are "NO", then may proceed with Cephalosporin use.   Sulfa Antibiotics Anaphylaxis and Other (See Comments)   Tomato Shortness Of Breath   Vancomycin Shortness Of Breath and Other (See Comments)   Carvedilol     LEG SWELLING, DIZZNESS   Flagyl [Metronidazole] Itching   Milk-Related Compounds  Morphine Other (See Comments)   Penicillin G Other (See Comments)   Toradol [Ketorolac Tromethamine] Nausea And Vomiting    itching   Acetaminophen Rash and Other (See Comments)    Pt tolerates percocet and also plain APAP without rash   Tramadol Itching, Nausea And Vomiting and Other (See Comments)   Current Outpatient Medications on File Prior to Visit  Medication Sig Dispense Refill   albuterol (PROVENTIL) (2.5 MG/3ML) 0.083% nebulizer solution Take 3 mLs (2.5 mg total) by nebulization every 6 (six) hours as needed for wheezing or shortness of breath. 75 mL 3   albuterol (VENTOLIN HFA) 108 (90 Base) MCG/ACT inhaler Inhale 2 puffs into the lungs every 6 (six) hours as needed for wheezing or shortness of breath. 36 g 3   ALPRAZolam (XANAX) 0.5 MG tablet TAKE 1 TABLET(0.5 MG) BY MOUTH TWICE DAILY AS NEEDED FOR SLEEP 180 tablet 0   Beclomethasone Dipropionate (QNASL) 80 MCG/ACT AERS Place 2 sprays into the nose daily. 10.6 g 2   bictegravir-emtricitabine-tenofovir AF (BIKTARVY) 50-200-25 MG TABS tablet Take 1 tablet by mouth daily. 30  tablet 11   Budeson-Glycopyrrol-Formoterol (BREZTRI AEROSPHERE) 160-9-4.8 MCG/ACT AERO Inhale 2 puffs into the lungs in the morning and at bedtime. 10.7 g 5   cetirizine (ZYRTEC) 10 MG tablet Take 1 tablet (10 mg total) by mouth daily. 90 tablet 3   cyanocobalamin (,VITAMIN B-12,) 1000 MCG/ML injection Inject 1 mL (1,000 mcg total) into the muscle every 30 (thirty) days. 3 mL 3   cyclobenzaprine (FLEXERIL) 5 MG tablet Take 5 mg by mouth 2 (two) times daily.     diclofenac Sodium (VOLTAREN) 1 % GEL APPLY 2 GRAMS EXTERNALLY TO THE AFFECTED AREA TWICE DAILY AS NEEDED FOR PAIN (Patient taking differently: Apply 2 g topically 2 (two) times daily as needed (pain). APPLY 2 GRAMS EXTERNALLY TO THE AFFECTED AREA TWICE DAILY AS NEEDED FOR PAIN) 100 g 0   EPINEPHrine 0.3 mg/0.3 mL IJ SOAJ injection Inject 0.3 mg into the muscle as needed for anaphylaxis. 1 each 5   gabapentin (NEURONTIN) 300 MG capsule Take 1 capsule (300 mg total) by mouth 4 (four) times daily as needed (pain). 120 capsule 11   Glucos-Chond-Hyal Ac-Ca Fructo (MOVE FREE JOINT HEALTH ADVANCE PO) Take 1 tablet by mouth daily.     hydrOXYzine (ATARAX/VISTARIL) 25 MG tablet Take 1 tablet (25 mg total) by mouth at bedtime. 90 tablet 3   methocarbamol (ROBAXIN) 500 MG tablet Take 500 mg by mouth 4 (four) times daily.     metoprolol succinate (TOPROL-XL) 100 MG 24 hr tablet      montelukast (SINGULAIR) 10 MG tablet Take 10 mg by mouth daily.     Multiple Vitamin (MULTIVITAMIN WITH MINERALS) TABS tablet Take 1 tablet by mouth daily after breakfast.      mupirocin ointment (BACTROBAN) 2 % APPLY EXTERNALLY TO THE AFFECTED AREA TWICE DAILY FOR 3 WEEKS, PLACE COTTON BETWEEN TOES TO ALLOW BETTER AERATION AS NEEDED 66 g 1   omeprazole (PRILOSEC) 40 MG capsule Take 40 mg by mouth daily.     OVER THE COUNTER MEDICATION Take 2 capsules by mouth daily. Zhou: thyroid supplement     tizanidine (ZANAFLEX) 6 MG capsule Take 1 capsule (6 mg total) by mouth 3 (three)  times daily. 90 capsule 11   torsemide (DEMADEX) 20 MG tablet Take 10 mg by mouth See admin instructions. Tues Thurs and Sat     Ubrogepant (UBRELVY) 50 MG TABS Take 1 tablet at the onset  of migraine can repeat in 2 hours if needed. 15 tablet 3   [DISCONTINUED] zolpidem (AMBIEN) 5 MG tablet Take 1-2 tablets (5-10 mg total) by mouth at bedtime as needed for sleep. (Patient not taking: Reported on 04/23/2019) 30 tablet 2   No current facility-administered medications on file prior to visit.        ROS:  All others reviewed and negative.  Objective        PE:  BP 128/70   Pulse 71   Temp 98.3 F (36.8 C) (Oral)   Ht 5\' 10"  (1.778 m)   Wt 229 lb (103.9 kg)   SpO2 93%   BMI 32.86 kg/m                 Constitutional: Pt appears in NAD               HENT: Head: NCAT.                Right Ear: External ear normal.                 Left Ear: External ear normal.                Eyes: . Pupils are equal, round, and reactive to light. Conjunctivae and EOM are normal               Nose: without d/c or deformity               Neck: Neck supple. Gross normal ROM               Cardiovascular: Normal rate and regular rhythm.                 Pulmonary/Chest: Effort normal and breath sounds without rales but with midl bilat wheezing.                Abd:  Soft, NT, ND, + BS, no organomegaly               Neurological: Pt is alert. At baseline orientation, motor grossly intact               Skin: Skin is warm. No rashes, no other new lesions, LE edema - none               Psychiatric: Pt behavior is normal without agitation   Micro: none  Cardiac tracings I have personally interpreted today:  none  Pertinent Radiological findings (summarize): none   Lab Results  Component Value Date   WBC 6.8 12/27/2020   HGB 13.0 12/27/2020   HCT 39.3 12/27/2020   PLT 291.0 12/27/2020   GLUCOSE 95 12/27/2020   CHOL 183 12/27/2020   TRIG 133.0 12/27/2020   HDL 33.10 (L) 12/27/2020   LDLCALC 123 (H)  12/27/2020   ALT 20 12/27/2020   AST 18 12/27/2020   NA 138 12/27/2020   K 4.1 12/27/2020   CL 103 12/27/2020   CREATININE 1.25 12/27/2020   BUN 11 12/27/2020   CO2 28 12/27/2020   TSH 1.63 12/27/2020   PSA 0.00 Repeated and verified X2. (L) 12/27/2020   INR 1.0 09/27/2020   HGBA1C (L) 10/10/2010    4.3 (NOTE)  According to the ADA Clinical Practice Recommendations for 2011, when HbA1c is used as a screening test:   >=6.5%   Diagnostic of Diabetes Mellitus           (if abnormal result  is confirmed)  5.7-6.4%   Increased risk of developing Diabetes Mellitus  References:Diagnosis and Classification of Diabetes Mellitus,Diabetes OIBB,0488,89(VQXIH 1):S62-S69 and Standards of Medical Care in         Diabetes - 2011,Diabetes WTUU,8280,03  (Suppl 1):S11-S61.   MICROALBUR 16.88 (H) 10/05/2011   Assessment/Plan:  Casey Brennan is a 60 y.o. American [place] or [place] Native [3] Asian [4] Black or African American [2] White or Caucasian [1] male with  has a past medical history of Arthritis, Asthma, Axillary lymphadenopathy, Bronchitis, Chronic anemia, Chronic folliculitis, Elevated PSA, Environmental and seasonal allergies, GERD (gastroesophageal reflux disease), Gross hematuria, H/O pericarditis, HCAP (healthcare-associated pneumonia) (11/19/2014), Headache(784.0), Hiatal hernia, History of concussion, History of gastric ulcer, History of kidney stones, History of MRSA infection (2010), HIV (human immunodeficiency virus infection) ([place]) (1988), Hypertension, Internal hemorrhoids, Lytic bone lesion of hip, 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  Vitamin D deficiency Last vitamin D Lab Results  Component Value Date   VD25OH 21.81 (L) 12/27/2020   Low, to start oral replacement   Cough Mild to  mod, c/w bornchitis vs pna, for cxr, for antibx course, cough med prn, to f/u any worsening symptoms or concerns  Wheezing Mild to mod, for predpac asd,,  to f/u any worsening symptoms or concerns  Mixed hyperlipidemia Lab Results  Component Value Date   LDLCALC 123 (H) 12/27/2020   uncontrolled, pt to continue current low chol diet, declines statin   Followup: Return if symptoms worsen or fail to improve.  Cathlean Cower, MD 04/09/2021 11:15 PM [facility]

## 2021-04-05 NOTE — Patient Instructions (Signed)
Casey Brennan , Thank you for taking time to come for your Medicare Wellness Visit. I appreciate your ongoing commitment to your health goals. Please review the following plan we discussed and let me know if I can assist you in the future.   Screening recommendations/referrals: Colonoscopy: 12/26/2018; due every 10 years Recommended yearly ophthalmology/optometry visit for glaucoma screening and checkup Recommended yearly dental visit for hygiene and checkup  Vaccinations: Influenza vaccine: 05/20/2020 Pneumococcal vaccine: 11/11/2020, 11/15/2020 Tdap vaccine: 06/12/2011; due every 10 years Shingles vaccine: 12/27/2020; need second dose   Covid-19: 10/23/2019, 11/13/2019, 05/25/2020  Advanced directives: Yes; documents on file.  Conditions/risks identified: Yes; my goal is to lose 15-20 pounds.  Next appointment: Please schedule your next Medicare Wellness Visit with your Nurse Health Advisor in 1 year by calling [phone].  Preventive Care 40-64 Years, Male Preventive care refers to lifestyle choices and visits with your health care provider that can promote health and wellness. What does preventive care include? A yearly physical exam. This is also called an annual well check. Dental exams once or twice a year. Routine eye exams. Ask your health care provider how often you should have your eyes checked. Personal lifestyle choices, including: Daily care of your teeth and gums. Regular physical activity. Eating a healthy diet. Avoiding tobacco and drug use. Limiting alcohol use. Practicing safe sex. Taking low-dose aspirin every day starting at age 50. What happens during an annual well check? The services and screenings done by your health care provider during your annual well check will depend on your age, overall health, lifestyle risk factors, and family history of disease. Counseling  Your health care provider may ask you questions about your: Alcohol use. Tobacco use. Drug  use. Emotional well-being. Home and relationship well-being. Sexual activity. Eating habits. Work and work Statistician. Screening  You may have the following tests or measurements: Height, weight, and BMI. Blood pressure. Lipid and cholesterol levels. These may be checked every 5 years, or more frequently if you are over 1 years old. Skin check. Lung cancer screening. You may have this screening every year starting at age 7 if you have a 30-pack-year history of smoking and currently smoke or have quit within the past 15 years. Fecal occult blood test (FOBT) of the stool. You may have this test every year starting at age 86. Flexible sigmoidoscopy or colonoscopy. You may have a sigmoidoscopy every 5 years or a colonoscopy every 10 years starting at age 2. Prostate cancer screening. Recommendations will vary depending on your family history and other risks. Hepatitis C blood test. Hepatitis B blood test. Sexually transmitted disease (STD) testing. Diabetes screening. This is done by checking your blood sugar (glucose) after you have not eaten for a while (fasting). You may have this done every 1-3 years. Discuss your test results, treatment options, and if necessary, the need for more tests with your health care provider. Vaccines  Your health care provider may recommend certain vaccines, such as: Influenza vaccine. This is recommended every year. Tetanus, diphtheria, and acellular pertussis (Tdap, Td) vaccine. You may need a Td booster every 10 years. Zoster vaccine. You may need this after age 74. Pneumococcal 13-valent conjugate (PCV13) vaccine. You may need this if you have certain conditions and have not been vaccinated. Pneumococcal polysaccharide (PPSV23) vaccine. You may need one or two doses if you smoke cigarettes or if you have certain conditions. Talk to your health care provider about which screenings and vaccines you need and how often you need  them. This information is  not intended to replace advice given to you by your health care provider. Make sure you discuss any questions you have with your health care provider. Document Released: 07/16/2015 Document Revised: 03/08/2016 Document Reviewed: 04/20/2015 Elsevier Interactive Patient Education  2017 [place] Prevention in the Home Falls can cause injuries. They can happen to people of all ages. There are many things you can do to make your home safe and to help prevent falls. What can I do on the outside of my home? Regularly fix the edges of walkways and driveways and fix any cracks. Remove anything that might make you trip as you walk through a door, such as a raised step or threshold. Trim any bushes or trees on the path to your home. Use bright outdoor lighting. Clear any walking paths of anything that might make someone trip, such as rocks or tools. Regularly check to see if handrails are loose or broken. Make sure that both sides of any steps have handrails. Any raised decks and porches should have guardrails on the edges. Have any leaves, snow, or ice cleared regularly. Use sand or salt on walking paths during winter. Clean up any spills in your garage right away. This includes oil or grease spills. What can I do in the bathroom? Use night lights. Install grab bars by the toilet and in the tub and shower. Do not use towel bars as grab bars. Use non-skid mats or decals in the tub or shower. If you need to sit down in the shower, use a plastic, non-slip stool. Keep the floor dry. Clean up any water that spills on the floor as soon as it happens. Remove soap buildup in the tub or shower regularly. Attach bath mats securely with double-sided non-slip rug tape. Do not have throw rugs and other things on the floor that can make you trip. What can I do in the bedroom? Use night lights. Make sure that you have a light by your bed that is easy to reach. Do not use any sheets or blankets that  are too big for your bed. They should not hang down onto the floor. Have a firm chair that has side arms. You can use this for support while you get dressed. Do not have throw rugs and other things on the floor that can make you trip. What can I do in the kitchen? Clean up any spills right away. Avoid walking on wet floors. Keep items that you use a lot in easy-to-reach places. If you need to reach something above you, use a strong step stool that has a grab bar. Keep electrical cords out of the way. Do not use floor polish or wax that makes floors slippery. If you must use wax, use non-skid floor wax. Do not have throw rugs and other things on the floor that can make you trip. What can I do with my stairs? Do not leave any items on the stairs. Make sure that there are handrails on both sides of the stairs and use them. Fix handrails that are broken or loose. Make sure that handrails are as long as the stairways. Check any carpeting to make sure that it is firmly attached to the stairs. Fix any carpet that is loose or worn. Avoid having throw rugs at the top or bottom of the stairs. If you do have throw rugs, attach them to the floor with carpet tape. Make sure that you have a light switch at  the top of the stairs and the bottom of the stairs. If you do not have them, ask someone to add them for you. What else can I do to help prevent falls? Wear shoes that: Do not have high heels. Have rubber bottoms. Are comfortable and fit you well. Are closed at the toe. Do not wear sandals. If you use a stepladder: Make sure that it is fully opened. Do not climb a closed stepladder. Make sure that both sides of the stepladder are locked into place. Ask someone to hold it for you, if possible. Clearly mark and make sure that you can see: Any grab bars or handrails. First and last steps. Where the edge of each step is. Use tools that help you move around (mobility aids) if they are needed. These  include: Canes. Walkers. Scooters. Crutches. Turn on the lights when you go into a dark area. Replace any light bulbs as soon as they burn out. Set up your furniture so you have a clear path. Avoid moving your furniture around. If any of your floors are uneven, fix them. If there are any pets around you, be aware of where they are. Review your medicines with your doctor. Some medicines can make you feel dizzy. This can increase your chance of falling. Ask your doctor what other things that you can do to help prevent falls. This information is not intended to replace advice given to you by your health care provider. Make sure you discuss any questions you have with your health care provider. Document Released: 04/15/2009 Document Revised: 11/25/2015 Document Reviewed: 07/24/2014 Elsevier Interactive Patient Education  2017 [employer].

## 2021-04-05 NOTE — Progress Notes (Addendum)
Subjective:   Casey Brennan is a 61 y.o. male who presents for an Initial Medicare Annual Wellness Visit.  Review of Systems     Cardiac Risk Factors include: advanced age (>42men, >48 women);family history of premature cardiovascular disease;hypertension;male gender;obesity (BMI >30kg/m2)     Objective:    Today's Vitals   04/05/21 0856  BP: 128/70  Pulse: 71  Temp: 98.3 F (36.8 C)  SpO2: 93%  Weight: 229 lb 3.2 oz (104 kg)  Height: 5\' 10"  (1.778 m)  PainSc: 0-No pain   Body mass index is 32.89 kg/m.  Advanced Directives 04/05/2021 12/18/2020 09/27/2020 05/18/2019 05/18/2018 02/23/2018 04/26/2017  Does Patient Have a Medical Advance Directive? Yes No Yes No No Yes No  Type of Advance Directive - - [place];Living will - - [place];Living will -  Does patient want to make changes to medical advance directive? No - Patient declined - No - Patient declined - - - -  Copy of [place] in Chart? - - Yes - validated most recent copy scanned in chart (See row information) - - - -  Would patient like information on creating a medical advance directive? - No - Patient declined - No - Patient declined - - No - Patient declined  Pre-existing out of facility DNR order (yellow form or pink MOST form) - - - - - - -    Current Medications (verified) Outpatient Encounter Medications as of 04/05/2021  Medication Sig   levofloxacin (LEVAQUIN) 750 MG tablet Take by mouth.   albuterol (PROVENTIL) (2.5 MG/3ML) 0.083% nebulizer solution Take 3 mLs (2.5 mg total) by nebulization every 6 (six) hours as needed for wheezing or shortness of breath.   albuterol (VENTOLIN HFA) 108 (90 Base) MCG/ACT inhaler Inhale 2 puffs into the lungs every 6 (six) hours as needed for wheezing or shortness of breath.   ALPRAZolam (XANAX) 0.5 MG tablet TAKE 1 TABLET(0.5 MG) BY MOUTH TWICE DAILY AS NEEDED FOR SLEEP   Beclomethasone Dipropionate (QNASL) 80 MCG/ACT  AERS Place 2 sprays into the nose daily.   bictegravir-emtricitabine-tenofovir AF (BIKTARVY) 50-200-25 MG TABS tablet Take 1 tablet by mouth daily.   Budeson-Glycopyrrol-Formoterol (BREZTRI AEROSPHERE) 160-9-4.8 MCG/ACT AERO Inhale 2 puffs into the lungs in the morning and at bedtime.   cetirizine (ZYRTEC) 10 MG tablet Take 1 tablet (10 mg total) by mouth daily.   cyanocobalamin (,VITAMIN B-12,) 1000 MCG/ML injection Inject 1 mL (1,000 mcg total) into the muscle every 30 (thirty) days.   cyclobenzaprine (FLEXERIL) 5 MG tablet Take 5 mg by mouth 2 (two) times daily.   diclofenac Sodium (VOLTAREN) 1 % GEL APPLY 2 GRAMS EXTERNALLY TO THE AFFECTED AREA TWICE DAILY AS NEEDED FOR PAIN (Patient taking differently: Apply 2 g topically 2 (two) times daily as needed (pain). APPLY 2 GRAMS EXTERNALLY TO THE AFFECTED AREA TWICE DAILY AS NEEDED FOR PAIN)   EPINEPHrine 0.3 mg/0.3 mL IJ SOAJ injection Inject 0.3 mg into the muscle as needed for anaphylaxis.   gabapentin (NEURONTIN) 300 MG capsule Take 1 capsule (300 mg total) by mouth 4 (four) times daily as needed (pain).   Glucos-Chond-Hyal Ac-Ca Fructo (MOVE FREE JOINT HEALTH ADVANCE PO) Take 1 tablet by mouth daily.   HYDROmorphone (DILAUDID) 2 MG tablet Take 1 tablet (2 mg total) by mouth every 6 (six) hours as needed for up to 6 doses for severe pain. (Patient not taking: Reported on 04/05/2021)   hydrOXYzine (ATARAX/VISTARIL) 25 MG tablet Take  1 tablet (25 mg total) by mouth at bedtime.   methocarbamol (ROBAXIN) 500 MG tablet Take 500 mg by mouth 4 (four) times daily.   metoprolol succinate (TOPROL-XL) 100 MG 24 hr tablet    montelukast (SINGULAIR) 10 MG tablet Take 10 mg by mouth daily.   Multiple Vitamin (MULTIVITAMIN WITH MINERALS) TABS tablet Take 1 tablet by mouth daily after breakfast.    mupirocin ointment (BACTROBAN) 2 % APPLY EXTERNALLY TO THE AFFECTED AREA TWICE DAILY FOR 3 WEEKS, PLACE COTTON BETWEEN TOES TO ALLOW BETTER AERATION AS NEEDED    omeprazole (PRILOSEC) 40 MG capsule Take 40 mg by mouth daily.   OVER THE COUNTER MEDICATION Take 2 capsules by mouth daily. Zhou: thyroid supplement   tizanidine (ZANAFLEX) 6 MG capsule Take 1 capsule (6 mg total) by mouth 3 (three) times daily.   torsemide (DEMADEX) 20 MG tablet Take 10 mg by mouth See admin instructions. Tues Thurs and Sat   Ubrogepant (UBRELVY) 50 MG TABS Take 1 tablet at the onset of migraine can repeat in 2 hours if needed.   [DISCONTINUED] doxycycline (VIBRA-TABS) 100 MG tablet Take 1 tablet (100 mg total) by mouth 2 (two) times daily.   [DISCONTINUED] labetalol (NORMODYNE) 200 MG tablet Take 0.5 tablets (100 mg total) by mouth 2 (two) times daily.   [DISCONTINUED] nitrofurantoin, macrocrystal-monohydrate, (MACROBID) 100 MG capsule Take 1 capsule (100 mg total) by mouth 2 (two) times daily.   [DISCONTINUED] zolpidem (AMBIEN) 5 MG tablet Take 1-2 tablets (5-10 mg total) by mouth at bedtime as needed for sleep. (Patient not taking: Reported on 04/23/2019)   No facility-administered encounter medications on file as of 04/05/2021.    Allergies (verified) Chocolate, Descovy [emtricitabine-tenofovir af], Genvoya [elviteg-cobic-emtricit-tenofaf], Morphine and related, Oxycodone-acetaminophen, Penicillins, Sulfa antibiotics, Tomato, Vancomycin, Carvedilol, Flagyl [metronidazole], Milk-related compounds, Morphine, Penicillin g, Toradol [ketorolac tromethamine], Acetaminophen, and Tramadol   History: Past Medical History:  Diagnosis Date   Arthritis    r knee    Asthma    very rare   Axillary lymphadenopathy    Bronchitis    Chronic anemia    normocytic   Chronic folliculitis    Elevated PSA    Environmental and seasonal allergies    GERD (gastroesophageal reflux disease)    Patient denies   Gross hematuria    H/O pericarditis    2010--  myopercarditis--  resolved   HCAP (healthcare-associated pneumonia) 11/19/2014   Headache(784.0)    HX SEVERE FRONTAL HA'S   Hiatal  hernia    History of concussion    2012  &  2013  RESIDUAL HA'S --  RESOLVED   History of gastric ulcer    History of kidney stones    History of MRSA infection 2010   infected boil   HIV (human immunodeficiency virus infection) ([place]) 1988   Hypertension    Internal hemorrhoids    Lytic bone lesion of hip    WORK-UP DONE BY ONCOLOGIST DR HA --  NOT MALIGNANT   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   Pneumonia    hx of   Post concussion syndrome    resolved   Prostate cancer ([place]) 04/25/13   gleason 3+3=6, volume 30 gm   Sinusitis, chronic 05/14/2015   Ulcer    hx of gastric   Wears glasses    Past Surgical History:  Procedure Laterality Date   ACDCF  09/29/2020   ANTERIOR CERVICAL DECOMPRESSION/DISCECTOMY FUSION 4 LEVELS N/A 09/29/2020   Procedure: ANTERIOR CERVICAL DECOMPRESSION FUSION CERVICAL 3-CERVICAL  4 , CERVICAL 4 - CERVICAL 5, CERVICAL 5 - CERVICAL 6 WITH INSTRUMENTATION AND ALLOGRAFT;  Surgeon: Phylliss Bob, MD;  Location: [facility];  Service: Orthopedics;  Laterality: N/A;   CARDIAC CATHETERIZATION  03-23-2009  DR COOPER   NORMAL CORONARY ARTERIES   CHOLECYSTECTOMY N/A 11/06/2014   Procedure: LAPAROSCOPIC CHOLECYSTECTOMY WITH INTRAOPERATIVE CHOLANGIOGRAM;  Surgeon: Judeth Horn, MD;  Location: [facility];  Service: General;  Laterality: N/A;   COLONOSCOPY  12/26/2011   Procedure: COLONOSCOPY;  Surgeon: Lear Ng, MD;  Location: [facility];  Service: Endoscopy;  Laterality: N/A;   COLONOSCOPY     COLONOSCOPY WITH PROPOFOL N/A 07/29/2014   Procedure: COLONOSCOPY WITH PROPOFOL;  Surgeon: Lear Ng, MD;  Location: [facility];  Service: Endoscopy;  Laterality: N/A;   CYSTOSCOPY N/A 05/18/2018   Procedure: CYSTOSCOPY REMOVAL FOREIGN BODY;  Surgeon: Kathie Rhodes, MD;  Location: [facility];  Service: Urology;  Laterality: N/A;   CYSTOSCOPY/RETROGRADE/URETEROSCOPY Bilateral 04/25/2013   Procedure: CYSTOSCOPY/ BILATERAL RETROGRADES; BLADDER BIOPSIES;  Surgeon: Alexis Frock, MD;  Location: [facility];  Service: Urology;  Laterality: Bilateral;   DENTAL EXAMINATION UNDER ANESTHESIA     ESOPHAGOGASTRODUODENOSCOPY  12/26/2011   Procedure: ESOPHAGOGASTRODUODENOSCOPY (EGD);  Surgeon: Lear Ng, MD;  Location: [facility];  Service: Endoscopy;  Laterality: N/A;   ESOPHAGOGASTRODUODENOSCOPY (EGD) WITH PROPOFOL N/A 07/29/2014   Procedure: ESOPHAGOGASTRODUODENOSCOPY (EGD) WITH PROPOFOL;  Surgeon: Lear Ng, MD;  Location: [facility];  Service: Endoscopy;  Laterality: N/A;   EXCISION CHRONIC LEFT BREAST ABSCESS  09-21-2010   EXCISIONAL BX LEFT BREAST MASS/  I  &  D LEFT BREAST ABSCESS  03-24-2009   KNEE ARTHROSCOPY Right 1985   left axilla biopsy  04/2013   left hip biopsy  10/2012   LYMPHADENECTOMY Bilateral 08/18/2013   Procedure: LYMPHADENECTOMY "PELVIC LYMPH NODE DISSECTION";  Surgeon: Alexis Frock, MD;  Location: [facility];  Service: Urology;  Laterality: Bilateral;   PROSTATE BIOPSY N/A 04/25/2013   Procedure: BIOPSY TRANSRECTAL ULTRASONIC PROSTATE (TUBP);  Surgeon: Alexis Frock, MD;  [facility];  Service: Urology;  Laterality: N/A;   ROBOT ASSISTED LAPAROSCOPIC RADICAL PROSTATECTOMY N/A 08/18/2013   Procedure: ROBOTIC ASSISTED LAPAROSCOPIC RADICAL PROSTATECTOMY;  Surgeon: Alexis Frock, MD;  Location: [facility];  Service: Urology;  Laterality: N/A;   TOTAL KNEE ARTHROPLASTY Right 08/01/2016   08/17/16   TOTAL KNEE ARTHROPLASTY Right 08/01/2016   Procedure: TOTAL KNEE ARTHROPLASTY;  Surgeon: Renette Butters, MD;  Location: [facility];  Service: Orthopedics;  Laterality: Right;   UPPER GASTROINTESTINAL ENDOSCOPY     UPPER LEG SOFT TISSUE BIOPSY Left 2012   lthigh   Family History  Problem Relation Age of Onset   Stroke Father    Diabetes Father    Cancer Father        brain cancer   Asthma Father    Hypertension Sister    Cancer Maternal Uncle        prostate cancer   Asthma Sister    Colon cancer  Neg Hx    Colon polyps Neg Hx    Esophageal cancer Neg Hx    Stomach cancer Neg Hx    Rectal cancer Neg Hx    Social History   Socioeconomic History   Marital status: Single    Spouse name: Not on file   Number of children: 1   Years of education: College   Highest education level: Not on file  Occupational History   Occupation: truck Geophysicist/field seismologist  Employer: NOT EMPLOYED    Comment: past   Occupation: disability  Tobacco Use   Smoking status: Former    Packs/day: 1.50    Years: 23.00    Pack years: 34.50    Types: Cigars, Cigarettes    Quit date: 05/22/2010    Years since quitting: 10.8   Smokeless tobacco: Never  Vaping Use   Vaping Use: Never used  Substance and Sexual Activity   Alcohol use: Not Currently    Alcohol/week: 0.0 standard drinks    Comment: 1-2 drinks a week    Drug use: No   Sexual activity: Not Currently    Birth control/protection: Condom    Comment: pt. declined condoms 03/11/20  Other Topics Concern   Not on file  Social History Narrative   Sister April stays with him currently although he hopes to return to truck driving. 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 Sister is starting medical school next year.   Patient is divorced and lives with his sister.   Patient has one adult son.   Patient is part-time, driving a truck.   Patient has a college education.   Patient is right-handed.   Patient drinks 2-3 cups of coffee and a 4-5 cups of tea.   Social Determinants of Health   Financial Resource Strain: Low Risk    Difficulty of Paying Living Expenses: Not hard at all  Food Insecurity: No Food Insecurity   Worried About Charity fundraiser in the Last Year: Never true   [place] in the Last Year: Never true  Transportation Needs: No Transportation Needs   Lack of Transportation (Medical): No   Lack of Transportation (Non-Medical): No  Physical Activity: Sufficiently Active   Days of Exercise per Week: 5 days   Minutes of Exercise  per Session: 30 min  Stress: No Stress Concern Present   Feeling of Stress : Not at all  Social Connections: Socially Isolated   Frequency of Communication with Friends and Family: More than three times a week   Frequency of Social Gatherings with Friends and Family: Once a week   Attends Religious Services: Never   Marine scientist or Organizations: No   Attends Music therapist: Never   Marital Status: Divorced    Tobacco Counseling Counseling given: Not Answered   Clinical Intake:  Pre-visit preparation completed: Yes  Pain : No/denies pain Pain Score: 0-No pain     BMI - recorded: 32.89 Nutritional Status: BMI > 30  Obese Nutritional Risks: None Diabetes: No  How often do you need to have someone help you when you read instructions, pamphlets, or other written materials from your doctor or pharmacy?: 1 - Never What is the last grade level you completed in school?: [place] Degree; [employer] Driver  Diabetic? no  Interpreter Needed?: No  Information entered by :: Lisette Abu, LPN   Activities of Daily Living In your present state of health, do you have any difficulty performing the following activities: 04/05/2021 09/27/2020  Hearing? N N  Vision? N N  Difficulty concentrating or making decisions? N N  Walking or climbing stairs? N N  Dressing or bathing? N N  Doing errands, shopping? N N  Preparing Food and eating ? N -  Using the Toilet? N -  In the past six months, have you accidently leaked urine? N -  Do you have problems with loss of bowel control? N -  Managing your Medications?  N -  Housekeeping or managing your Housekeeping? N -  Some recent data might be hidden    Patient Care Team: Biagio Borg, MD as PCP - General (Internal Medicine) Bjorn Loser, MD as Attending Physician (Urology) Wilford Corner, MD as Attending Physician (Gastroenterology) Rolm Bookbinder, MD as Attending Physician (General  Surgery) Elmarie Shiley, MD (Nephrology) Nobie Putnam, MD (Hematology and Oncology) Jason Nest, [place] as Consulting Physician (Optometry)  Indicate any recent Medical Services you may have received from other than [facility] providers in the past year (date may be approximate).     Assessment:   This is a routine wellness examination for [place].  Hearing/Vision screen Hearing Screening - Comments:: Patient denied any hearing difficulty.   No hearing aids.  Vision Screening - Comments:: Patient wears corrective glasses/contacts.  Eye exam done annually by: [facility] Griffin [facility] with [facility]  Dietary issues and exercise activities discussed: Current Exercise Habits: Home exercise routine, Type of exercise: walking, Time (Minutes): 30, Frequency (Times/Week): 5, Weekly Exercise (Minutes/Week): 150, Intensity: Moderate, Exercise limited by: respiratory conditions(s)   Goals Addressed               This Visit's Progress     Patient Stated (pt-stated)        My goal is to lose 15-20 pounds.      Depression Screen PHQ 2/9 Scores 04/05/2021 11/11/2020 06/23/2020 03/11/2020 10/27/2019 07/07/2019 06/06/2017  PHQ - 2 Score 0 0 0 0 0 0 0  PHQ- 9 Score - - - - - - -    Fall Risk Fall Risk  04/05/2021 11/11/2020 06/23/2020 03/11/2020 10/27/2019  Falls in the past year? 0 0 0 1 0  Number falls in past yr: 0 - 0 0 -  Injury with Fall? 0 - 0 0 -  Comment - - - - -  Risk Factor Category  - - - - -  Risk for fall due to : No Fall Risks Impaired balance/gait - History of fall(s) -  Risk for fall due to: Comment - - - - -  Follow up Falls evaluation completed Falls evaluation completed - Falls evaluation completed Falls evaluation completed    FALL RISK PREVENTION PERTAINING TO THE HOME:  Any stairs in or around the home? No  If so, are there any without handrails? No  Home free of loose throw rugs in walkways, pet beds, electrical cords, etc? Yes  Adequate lighting in your home to reduce risk of falls?  Yes   ASSISTIVE DEVICES UTILIZED TO PREVENT FALLS:  Life alert? No  Use of a cane, walker or w/c? No  Grab bars in the bathroom? No  Shower chair or bench in shower? No  Elevated toilet seat or a handicapped toilet? No   TIMED UP AND GO:  Was the test performed? Yes .  Length of time to ambulate 10 feet: 8 sec.   Gait steady and fast without use of assistive device  Cognitive Function: Normal cognitive status assessed by direct observation by this Nurse Health Advisor. No abnormalities found.          Immunizations Immunization History  Administered Date(s) Administered   Hepatitis A 06/12/2011   Influenza Split 06/12/2012   Influenza Whole 04/19/2009, 07/25/2010, 02/22/2011   Influenza,inj,Quad PF,6+ Mos 06/04/2013, 04/27/2014, 07/21/2015, 05/22/2016, 04/27/2017, 05/20/2020   Influenza-Unspecified 01/30/2015   Meningococcal Mcv4o 05/22/2016   PFIZER(Purple Top)SARS-COV-2 Vaccination 10/23/2019, 11/13/2019, 05/25/2020   PPD Test 05/05/2009   Pneumococcal Conjugate-13 11/11/2020   Pneumococcal  Polysaccharide-23 03/22/2009, 08/19/2013, 11/15/2020   Tdap 06/12/2011   Zoster Recombinat (Shingrix) 12/27/2020    TDAP status: Up to date  Flu Vaccine status: Up to date  Pneumococcal vaccine status: Up to date  Covid-19 vaccine status: Completed vaccines  Qualifies for Shingles Vaccine? Yes   Zostavax completed No   Shingrix Completed?: No.    Education has been provided regarding the importance of this vaccine. Patient has been advised to call insurance company to determine out of pocket expense if they have not yet received this vaccine. Advised may also receive vaccine at local pharmacy or Health Dept. Verbalized acceptance and understanding.  Screening Tests Health Maintenance  Topic Date Due   COVID-19 Vaccine (4 - Booster for Pfizer series) 08/17/2020   INFLUENZA VACCINE  01/31/2021   Zoster Vaccines- Shingrix (2 of 2) 02/21/2021   TETANUS/TDAP  06/11/2021    COLONOSCOPY (Pts 45-55yrs Insurance coverage will need to be confirmed)  12/25/2028   Hepatitis C Screening  Completed   HIV Screening  Completed   HPV VACCINES  Aged Out    Health Maintenance  Health Maintenance Due  Topic Date Due   COVID-19 Vaccine (4 - Booster for [employer]bilt series) 08/17/2020   INFLUENZA VACCINE  01/31/2021   Zoster Vaccines- Shingrix (2 of 2) 02/21/2021    Colorectal cancer screening: Type of screening: Colonoscopy. Completed 12/26/2018. Repeat every 10 years  Lung Cancer Screening: (Low Dose CT Chest recommended if Age 58-80 years, 30 pack-year currently smoking OR have quit w/in 15years.) does not qualify.   Lung Cancer Screening Referral: no  Additional Screening:  Hepatitis C Screening: does qualify; Completed yes  Vision Screening: Recommended annual ophthalmology exams for early detection of glaucoma and other disorders of the eye. Is the patient up to date with their annual eye exam?  Yes  Who is the provider or what is the name of the office in which the patient attends annual eye exams? Dr. Alvina Chou with [facility] If pt is not established with a provider, would they like to be referred to a provider to establish care? No .   Dental Screening: Recommended annual dental exams for proper oral hygiene  Community Resource Referral / Chronic Care Management: CRR required this visit?  No   CCM required this visit?  No      Plan:     I have personally reviewed and noted the following in the patient's chart:   Medical and social history Use of alcohol, tobacco or illicit drugs  Current medications and supplements including opioid prescriptions. Patient is not currently taking opioid prescriptions. Functional ability and status Nutritional status Physical activity Advanced directives List of other physicians Hospitalizations, surgeries, and ER visits in previous 12 months Vitals Screenings to include cognitive, depression, and  falls Referrals and appointments  In addition, I have reviewed and discussed with patient certain preventive protocols, quality metrics, and best practice recommendations. A written personalized care plan for preventive services as well as general preventive health recommendations were provided to patient.     Sheral Flow, LPN   [id number]   Nurse Notes:  Hearing Screening - Comments:: Patient denied any hearing difficulty.   No hearing aids.  Vision Screening - Comments:: Patient wears corrective glasses/contacts.  Eye exam done annually by: Dr. Griffin Basil with [facility] screening examination/treatment/procedure(s) were performed by non-physician practitioner and as supervising physician I was immediately available for consultation/collaboration.  I agree with above. Cathlean Cower, MD

## 2021-04-05 NOTE — Progress Notes (Deleted)
Patient ID: Casey Brennan, male   DOB: Nov 03, 1960, 60 y.o.   MRN: [id number]  Virtual Visit via Video Note  I connected with Casey Brennan on 04/05/21 at  9:40 AM EDT by a video enabled telemedicine application and verified that I am speaking with the correct person using two identifiers.  Location: Patient: *** Provider: ***   I discussed the limitations of evaluation and management by telemedicine and the availability of in person appointments. The patient expressed understanding and agreed to proceed.  History of Present Illness:    Observations/Objective:   Assessment and Plan:   Follow Up Instructions:    I discussed the assessment and treatment plan with the patient. The patient was provided an opportunity to ask questions and all were answered. The patient agreed with the plan and demonstrated an understanding of the instructions.   The patient was advised to call back or seek an in-person evaluation if the symptoms worsen or if the condition fails to improve as anticipated.  I  Cathlean Cower, MD

## 2021-04-05 NOTE — Telephone Encounter (Signed)
Patient calling in with  respiratory symptoms: Shortness of breath, chest pain, palpitations or other red words send to Triage  Does the patient have a fever over 100 or positive COVID  test within the last 5 days?   Yes, please schedule virtual visit   No, proceed with following questions  Does the patient have 2 or more of the following symptoms?  Cough, sore throat, runny nose, chills, loss of taste or smell   Yes, please schedule virtual visit  If no availability for virtual visit in office,  please schedule another [place] office  If no availability at another [place] office, please instruct patient that they can schedule an evisit or virtual visit through mychart.    No, please schedule patient for in office visit (patients can be seen in office 5 days after positive COVID test)  If no availability in office, please schedule another [place] office  If no availability at another [place] office, please instruct patient that they can schedule an evisit or virtual visit through mychart.

## 2021-04-05 NOTE — Assessment & Plan Note (Signed)
Last vitamin D Lab Results  Component Value Date   VD25OH 21.81 (L) 12/27/2020   Low, to start oral replacement

## 2021-04-07 ENCOUNTER — Encounter: Payer: Self-pay | Admitting: Internal Medicine

## 2021-04-07 DIAGNOSIS — H40003 Preglaucoma, unspecified, bilateral: Secondary | ICD-10-CM | POA: Diagnosis not present

## 2021-04-07 DIAGNOSIS — H2513 Age-related nuclear cataract, bilateral: Secondary | ICD-10-CM | POA: Diagnosis not present

## 2021-04-07 DIAGNOSIS — H524 Presbyopia: Secondary | ICD-10-CM | POA: Diagnosis not present

## 2021-04-07 DIAGNOSIS — H4423 Degenerative myopia, bilateral: Secondary | ICD-10-CM | POA: Diagnosis not present

## 2021-04-07 DIAGNOSIS — H52203 Unspecified astigmatism, bilateral: Secondary | ICD-10-CM | POA: Diagnosis not present

## 2021-04-08 ENCOUNTER — Other Ambulatory Visit: Payer: Self-pay

## 2021-04-08 ENCOUNTER — Ambulatory Visit (INDEPENDENT_AMBULATORY_CARE_PROVIDER_SITE_OTHER): Payer: Medicare Other

## 2021-04-08 ENCOUNTER — Ambulatory Visit (INDEPENDENT_AMBULATORY_CARE_PROVIDER_SITE_OTHER): Payer: Medicare Other | Admitting: Infectious Diseases

## 2021-04-08 ENCOUNTER — Encounter: Payer: Self-pay | Admitting: Infectious Diseases

## 2021-04-08 VITALS — BP 132/83 | HR 83 | Temp 98.4°F | Wt 224.6 lb

## 2021-04-08 DIAGNOSIS — Z8546 Personal history of malignant neoplasm of prostate: Secondary | ICD-10-CM | POA: Diagnosis not present

## 2021-04-08 DIAGNOSIS — B20 Human immunodeficiency virus [HIV] disease: Secondary | ICD-10-CM

## 2021-04-08 DIAGNOSIS — Z113 Encounter for screening for infections with a predominantly sexual mode of transmission: Secondary | ICD-10-CM

## 2021-04-08 DIAGNOSIS — H2513 Age-related nuclear cataract, bilateral: Secondary | ICD-10-CM

## 2021-04-08 DIAGNOSIS — J441 Chronic obstructive pulmonary disease with (acute) exacerbation: Secondary | ICD-10-CM | POA: Diagnosis not present

## 2021-04-08 DIAGNOSIS — Z23 Encounter for immunization: Secondary | ICD-10-CM | POA: Diagnosis not present

## 2021-04-08 DIAGNOSIS — M47816 Spondylosis without myelopathy or radiculopathy, lumbar region: Secondary | ICD-10-CM | POA: Diagnosis not present

## 2021-04-08 DIAGNOSIS — Z79899 Other long term (current) drug therapy: Secondary | ICD-10-CM | POA: Diagnosis not present

## 2021-04-08 DIAGNOSIS — Z981 Arthrodesis status: Secondary | ICD-10-CM

## 2021-04-08 DIAGNOSIS — M4322 Fusion of spine, cervical region: Secondary | ICD-10-CM | POA: Diagnosis not present

## 2021-04-08 NOTE — Progress Notes (Signed)
Subjective:    Patient ID: [name], male  DOB: 02-Apr-1961, 60 y.o.        MRN: [id number]   HPI 60 yo M with hx HIV+, COPD Gold,  and prostate Ca.  Had radical prostatectomy 08-18-13, adenoCA, LN (-), margins (-).   Had R knee arthroscopy 1985, prev MRI and has severe osteoarthritis.  Had R TKR 08-2016  Was changed to Brown, prezista (05-2016). He developed a rash from the sulfa in the darunavir, changed back to his prev rx.  Prev ? Allergy to descovey (more likely darunavir).He was previously changed to biktarvy from ISN/TRV .    Has gotten COVID vax.  09-29-20: ANTERIOR CERVICAL DECOMPRESSION FUSION CERVICAL 3-CERVICAL 4 , CERVICAL 4 - CERVICAL 5, CERVICAL 5 - CERVICAL 6 WITH INSTRUMENTATION AND ALLOGRAFT His arm pain has since resolved.   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  Has been having some worsening of fatigue, loss of appetite. Attributes this to working too much. He also attributes to his neck surgery and having to learn how to swallow again. Not limited to solid foods (meats). He was recently started on cough syrup after urgent care visit (10-2) after developing cough, SOB, wheezing. Had pain across his chest and back (with deep breathing). Saw PCP as well and was given levaquin, prednisone taper.  Getting neck injection this AM for nerve inflammation.   Has seen by ophtho- concern for cataracts, glaucoma. Going to [employer].   Has been having L flank pain. Seen by renal and nephro. CT shows (12-18-20) 1. Suggestion of small volume hemorrhage within a chronic right renal midpole cyst. No pararenal extension or other complicating features. Recommend follow-up with Urology. 2. No superimposed urinary calculus or obstructive uropathy. And no other acute or inflammatory process identified in the non-contrast abdomen or pelvis. Normal appendix.  3. Small benign right adrenal adenoma. Has f/u next week.   HIV 1 RNA Quant  Date Value  11/11/2020 NOT  DETECTED copies/mL  03/11/2020 <20 Copies/mL  07/07/2019 <20 NOT DETECTED copies/mL   CD4 T Cell Abs (/uL)  Date Value  11/11/2020 659  03/11/2020 479  07/07/2019 580     Health Maintenance  Topic Date Due   COVID-19 Vaccine (4 - Booster for Pfizer series) 08/17/2020   INFLUENZA VACCINE  01/31/2021   Zoster Vaccines- Shingrix (2 of 2) 02/21/2021   TETANUS/TDAP  06/11/2021   COLONOSCOPY (Pts 45-20yrs Insurance coverage will need to be confirmed)  12/25/2028   Hepatitis C Screening  Completed   HIV Screening  Completed   HPV VACCINES  Aged Out      Review of Systems  Constitutional:  Positive for malaise/fatigue. Negative for chills and fever.  Eyes:  Positive for blurred vision.  Respiratory:  Positive for cough, sputum production and shortness of breath.   Gastrointestinal:  Negative for constipation and diarrhea.  Genitourinary:  Negative for dysuria.  Musculoskeletal:  Positive for neck pain.   Please see HPI. All other systems reviewed and negative.     Objective:  Physical Exam Vitals reviewed.  Constitutional:      Appearance: Normal appearance.  HENT:     Mouth/Throat:     Mouth: Mucous membranes are moist.     Pharynx: No oropharyngeal exudate.  Eyes:     Extraocular Movements: Extraocular movements intact.     Pupils: Pupils are equal, round, and reactive to light.  Cardiovascular:     Rate and Rhythm: Normal rate and regular  rhythm.  Pulmonary:     Effort: Pulmonary effort is normal.     Breath sounds: Normal breath sounds.  Abdominal:     General: Bowel sounds are normal. There is no distension.     Palpations: Abdomen is soft.     Tenderness: There is no abdominal tenderness.  Musculoskeletal:        General: Normal range of motion.     Cervical back: Normal range of motion and neck supple.     Right lower leg: No edema.     Left lower leg: No edema.     Comments: No tenderness at R TKR site.   Neurological:     General: No focal deficit  present.     Mental Status: He is alert and oriented to person, place, and time.           Assessment & Plan:

## 2021-04-08 NOTE — Assessment & Plan Note (Signed)
Appears to be doing well He has f/u with ortho for potential injections.

## 2021-04-08 NOTE — Assessment & Plan Note (Signed)
He is doing well on biktarvy COVID shot today He is caught up on flu and pna I encouraged him to f/u with pharmacy and get second shingles vax. He had labs this summer which we reviewed together.  Will repeat at his f/u visit in 9 months.

## 2021-04-08 NOTE — Assessment & Plan Note (Signed)
Most recent PSA 0.  Appreciate uro f/u.

## 2021-04-08 NOTE — Assessment & Plan Note (Signed)
Appreciate PCP f/u He is not SOB and did not cough in clinic Suspect he is on the mend.

## 2021-04-08 NOTE — Assessment & Plan Note (Signed)
Appreciate ophtho f/u for his cataracts and potential glaucoma.

## 2021-04-08 NOTE — Progress Notes (Signed)
   Covid-19 Vaccination Clinic  Name:  BOOKER BHATNAGAR    MRN: [id number] DOB: Mar 12, 1961  04/08/2021  Mr. Ngo was observed post Covid-19 immunization for 15 minutes without incident. He was provided with Vaccine Information Sheet and instruction to access the V-Safe system.   Mr. Perrell was instructed to call 911 with any severe reactions post vaccine: Difficulty breathing  Swelling of face and throat  A fast heartbeat  A bad rash all over body  Dizziness and weakness       Cary Wilford P Gwin Eagon, CMA

## 2021-04-09 ENCOUNTER — Encounter: Payer: Self-pay | Admitting: Internal Medicine

## 2021-04-09 DIAGNOSIS — R062 Wheezing: Secondary | ICD-10-CM | POA: Insufficient documentation

## 2021-04-09 NOTE — Assessment & Plan Note (Signed)
Lab Results  Component Value Date   IAXKPVV 748 (H) 12/27/2020   uncontrolled, pt to continue current low chol diet, declines statin

## 2021-04-09 NOTE — Assessment & Plan Note (Signed)
Mild to mod, for predpac asd,,  to f/u any worsening symptoms or concerns 

## 2021-04-09 NOTE — Assessment & Plan Note (Signed)
Mild to mod, c/w bornchitis vs pna, for cxr, for antibx course, cough med prn, to f/u any worsening symptoms or concerns

## 2021-04-11 ENCOUNTER — Emergency Department (HOSPITAL_COMMUNITY)
Admission: EM | Admit: 2021-04-11 | Discharge: 2021-04-11 | Disposition: A | Discharge status: Home / Self Care | Payer: Medicare Other | Attending: Emergency Medicine | Admitting: Emergency Medicine

## 2021-04-11 ENCOUNTER — Other Ambulatory Visit: Payer: Self-pay

## 2021-04-11 DIAGNOSIS — T7840XA Allergy, unspecified, initial encounter: Secondary | ICD-10-CM | POA: Diagnosis not present

## 2021-04-11 DIAGNOSIS — Z96652 Presence of left artificial knee joint: Secondary | ICD-10-CM | POA: Diagnosis not present

## 2021-04-11 DIAGNOSIS — J449 Chronic obstructive pulmonary disease, unspecified: Secondary | ICD-10-CM | POA: Insufficient documentation

## 2021-04-11 DIAGNOSIS — Z8546 Personal history of malignant neoplasm of prostate: Secondary | ICD-10-CM | POA: Insufficient documentation

## 2021-04-11 DIAGNOSIS — R110 Nausea: Secondary | ICD-10-CM | POA: Diagnosis not present

## 2021-04-11 DIAGNOSIS — Z79899 Other long term (current) drug therapy: Secondary | ICD-10-CM | POA: Insufficient documentation

## 2021-04-11 DIAGNOSIS — I10 Essential (primary) hypertension: Secondary | ICD-10-CM | POA: Insufficient documentation

## 2021-04-11 DIAGNOSIS — Z21 Asymptomatic human immunodeficiency virus [HIV] infection status: Secondary | ICD-10-CM | POA: Diagnosis not present

## 2021-04-11 DIAGNOSIS — R6889 Other general symptoms and signs: Secondary | ICD-10-CM | POA: Diagnosis not present

## 2021-04-11 DIAGNOSIS — I499 Cardiac arrhythmia, unspecified: Secondary | ICD-10-CM | POA: Diagnosis not present

## 2021-04-11 DIAGNOSIS — Z743 Need for continuous supervision: Secondary | ICD-10-CM | POA: Diagnosis not present

## 2021-04-11 DIAGNOSIS — J45909 Unspecified asthma, uncomplicated: Secondary | ICD-10-CM | POA: Diagnosis not present

## 2021-04-11 DIAGNOSIS — R070 Pain in throat: Secondary | ICD-10-CM | POA: Diagnosis not present

## 2021-04-11 DIAGNOSIS — Z87891 Personal history of nicotine dependence: Secondary | ICD-10-CM | POA: Diagnosis not present

## 2021-04-11 DIAGNOSIS — Z7951 Long term (current) use of inhaled steroids: Secondary | ICD-10-CM | POA: Diagnosis not present

## 2021-04-11 MED ORDER — FAMOTIDINE 20 MG PO TABS: 20.0000 mg | ORAL_TABLET | Freq: Two times a day (BID) | ORAL | 0 refills | Status: DC

## 2021-04-11 MED ORDER — DIPHENHYDRAMINE HCL 25 MG PO TABS: 25.0000 mg | ORAL_TABLET | Freq: Four times a day (QID) | ORAL | 0 refills | Status: AC | PRN

## 2021-04-11 MED ORDER — EPINEPHRINE 0.3 MG/0.3ML IJ SOAJ: 0.3000 mg | INTRAMUSCULAR | 0 refills | Status: DC | PRN

## 2021-04-11 NOTE — ED Notes (Signed)
Pt sitting in chair in hallway 1, pt denies feelings like his throat is closing, or sob, pt reports minor cp, pt requests something to drink, water given, resps even and unlabored,

## 2021-04-11 NOTE — Discharge Instructions (Addendum)
Please follow up with your PCP regarding ED visit today  Return to the ED for any new/worsening symptoms

## 2021-04-11 NOTE — ED Triage Notes (Signed)
EMS stated, pt ate chips and dip at 0900 and felt like his throat was closing.  Self adminstered 100mg  Benadryl . Given Epi .0.3, and Solumederol 125mg , Zofran 4mg  IV. Pt doing good .

## 2021-04-11 NOTE — ED Provider Notes (Signed)
Emergency Medicine Provider Triage Evaluation Note  Casey Brennan , a 60 y.o. male  was evaluated in triage.  Pt complains of allergic reaction that woke him up out of his sleep around 9 AM this morning.  Reports he ate chips and onion dip around 4:56 AM and went to sleep.  He woke up around 9 AM and feeling like his throat was closing.  His sister tried to administer epi however accidentally stuck herself and called EMS.  Patient administered 100 mg Benadryl at home.  He was given epi, Solu-Medrol, Zofran.  He states he feels like his throat is still slightly tight however has improved significantly with medications.  He does report he feels sleepy now..  Review of Systems  Positive: + throat swelling, SOB; all improved Negative: - vomiting, abd pain,r ash  Physical Exam  BP (!) 142/86 (BP Location: Left Arm)   Pulse 87   Temp 98.7 F (37.1 C)   Resp 16   SpO2 94%  Gen:   Awake, no distress   Resp:  Normal effort  MSK:   Moves extremities without difficulty  Other:  No wheezing. Speaking in full sentences. No rash. Throat clear.   Medical Decision Making  Medically screening exam initiated at 10:35 AM.  Appropriate orders placed.  Casey Brennan was informed that the remainder of the evaluation will be completed by another provider, this initial triage assessment does not replace that evaluation, and the importance of remaining in the ED until their evaluation is complete.     Eustaquio Maize, PA-C 04/11/21 [place], [place], DO 04/11/21 1238

## 2021-04-11 NOTE — ED Provider Notes (Signed)
[facility] EMERGENCY DEPARTMENT Provider Note   CSN: [id number] Arrival date & time: 04/11/21  1001     History Chief Complaint  Patient presents with   Allergic Reaction    Casey Brennan is a 60 y.o. male who presents to the ED today via EMS for allergic reaction. Pt was eating chips and dip when he began feeling like his throat was swelling. Pt believes that he was having a reaction to the onion dip. His sister attempted to administer an epi pen however accidentally gave it to herself instead. Pt took 100 mg Benadryl at home prior to EMS arrival and was given an epi pen, Solumedrol, and zofran. Pt reports he feels like his throat is still slightly tight however has improved with medications. He has no other complaints at this time.   The history is provided by the patient, the EMS personnel and medical records.      Past Medical History:  Diagnosis Date   Arthritis    r knee    Asthma    very rare   Axillary lymphadenopathy    Bronchitis    Chronic anemia    normocytic   Chronic folliculitis    Elevated PSA    Environmental and seasonal allergies    GERD (gastroesophageal reflux disease)    Patient denies   Gross hematuria    H/O pericarditis    2010--  myopercarditis--  resolved   HCAP (healthcare-associated pneumonia) 11/19/2014   Headache(784.0)    HX SEVERE FRONTAL HA'S   Hiatal hernia    History of concussion    2012  &  2013  RESIDUAL HA'S --  RESOLVED   History of gastric ulcer    History of kidney stones    History of MRSA infection 2010   infected boil   HIV (human immunodeficiency virus infection) ([place]) 1988   Hypertension    Internal hemorrhoids    Lytic bone lesion of hip    WORK-UP DONE BY ONCOLOGIST DR HA --  NOT MALIGNANT   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/2018   Pneumonia    hx of   Post concussion syndrome    resolved   Prostate cancer ([place]) 04/25/13   gleason 3+3=6, volume 30 gm   Sinusitis, chronic 05/14/2015    Ulcer    hx of gastric   Wears glasses     Patient Active Problem List   Diagnosis Date Noted   Wheezing 04/09/2021   AKI (acute kidney injury) ([place]) 12/29/2020   Allergic rhinitis due to pollen 12/27/2020   Allergy to dairy product 12/27/2020   Angioneurotic edema 12/27/2020   Mild persistent asthma, uncomplicated 76/73/4193   Allergic rhinitis 12/27/2020   Vitamin D deficiency 12/27/2020   S/P cervical spinal fusion 11/11/2020   Nevus 11/11/2020   Myelopathy ([place]) 09/29/2020   Chronic obstructive pulmonary disease with acute exacerbation ([place]) 07/13/2020   Encounter for well adult exam with abnormal findings 06/23/2020   Elevated TSH 05/20/2020   Mixed hyperlipidemia 05/12/2020   Generalized abdominal pain 07/07/2019   Peripheral neuropathy 06/02/2019   Healthcare maintenance 01/08/2019   Myopia of both eyes with astigmatism and presbyopia 09/13/2018   Tension headache 11/12/2017   Hepatitis B immune 06/06/2017   Chronic pain of left wrist 06/06/2017   Constipation 09/27/2016   Chronic renal insufficiency, stage 2 (mild) 05/22/2016   Skin lesion of right lower extremity 02/22/2016   Migraines 02/22/2016   Low back pain 02/22/2016   Osteoarthritis of  right knee 01/11/2016   Kidney stones 10/25/2015   Sinusitis, chronic 05/14/2015   Cough 01/18/2015   GERD (gastroesophageal reflux disease) 07/29/2014   COPD GOLD III 07/22/2014   Hx of primary hypertension 04/27/2014   H/O prostate cancer    Lytic bone lesion of hip 08/22/2012   Cataract, nuclear 05/15/2012   Nuclear sclerotic cataract of both eyes 05/15/2012   Asthma 01/23/2012   Insomnia 05/11/2011   Primary hypertension 04/17/2011   Mood disorder in conditions classified elsewhere 06/14/2009   Human immunodeficiency virus (HIV) disease ([place]) 04/19/2009    Past Surgical History:  Procedure Laterality Date   ACDCF  09/29/2020   ANTERIOR CERVICAL DECOMPRESSION/DISCECTOMY FUSION 4 LEVELS N/A 09/29/2020    Procedure: ANTERIOR CERVICAL DECOMPRESSION FUSION CERVICAL 3-CERVICAL 4 , CERVICAL 4 - CERVICAL 5, CERVICAL 5 - CERVICAL 6 WITH INSTRUMENTATION AND ALLOGRAFT;  Surgeon: Phylliss Bob, MD;  Location: [facility];  Service: Orthopedics;  Laterality: N/A;   CARDIAC CATHETERIZATION  03-23-2009  DR COOPER   NORMAL CORONARY ARTERIES   CHOLECYSTECTOMY N/A 11/06/2014   Procedure: LAPAROSCOPIC CHOLECYSTECTOMY WITH INTRAOPERATIVE CHOLANGIOGRAM;  Surgeon: Judeth Horn, MD;  Location: [facility];  Service: General;  Laterality: N/A;   COLONOSCOPY  12/26/2011   Procedure: COLONOSCOPY;  Surgeon: Lear Ng, MD;  Location: [facility];  Service: Endoscopy;  Laterality: N/A;   COLONOSCOPY     COLONOSCOPY WITH PROPOFOL N/A 07/29/2014   Procedure: COLONOSCOPY WITH PROPOFOL;  Surgeon: Lear Ng, MD;  Location: [facility];  Service: Endoscopy;  Laterality: N/A;   CYSTOSCOPY N/A 05/18/2018   Procedure: CYSTOSCOPY REMOVAL FOREIGN BODY;  Surgeon: Kathie Rhodes, MD;  Location: [facility];  Service: Urology;  Laterality: N/A;   CYSTOSCOPY/RETROGRADE/URETEROSCOPY Bilateral 04/25/2013   Procedure: CYSTOSCOPY/ BILATERAL RETROGRADES; BLADDER BIOPSIES;  Surgeon: Alexis Frock, MD;  Location: [facility];  Service: Urology;  Laterality: Bilateral;   DENTAL EXAMINATION UNDER ANESTHESIA     ESOPHAGOGASTRODUODENOSCOPY  12/26/2011   Procedure: ESOPHAGOGASTRODUODENOSCOPY (EGD);  Surgeon: Lear Ng, MD;  Location: [facility];  Service: Endoscopy;  Laterality: N/A;   ESOPHAGOGASTRODUODENOSCOPY (EGD) WITH PROPOFOL N/A 07/29/2014   Procedure: ESOPHAGOGASTRODUODENOSCOPY (EGD) WITH PROPOFOL;  Surgeon: Lear Ng, MD;  Location: [facility];  Service: Endoscopy;  Laterality: N/A;   EXCISION CHRONIC LEFT BREAST ABSCESS  09-21-2010   EXCISIONAL BX LEFT BREAST MASS/  I  &  D LEFT BREAST ABSCESS  03-24-2009   KNEE ARTHROSCOPY Right 1985   left axilla biopsy  04/2013   left hip biopsy  10/2012    LYMPHADENECTOMY Bilateral 08/18/2013   Procedure: LYMPHADENECTOMY "PELVIC LYMPH NODE DISSECTION";  Surgeon: Alexis Frock, MD;  Location: [facility];  Service: Urology;  Laterality: Bilateral;   PROSTATE BIOPSY N/A 04/25/2013   Procedure: BIOPSY TRANSRECTAL ULTRASONIC PROSTATE (TUBP);  Surgeon: Alexis Frock, MD;  Location[facility];  Service: Urology;  Laterality: N/A;   ROBOT ASSISTED LAPAROSCOPIC RADICAL PROSTATECTOMY N/A 08/18/2013   Procedure: ROBOTIC ASSISTED LAPAROSCOPIC RADICAL PROSTATECTOMY;  Surgeon: Alexis Frock, MD;  Location: [facility];  Service: Urology;  Laterality: N/A;   TOTAL KNEE ARTHROPLASTY Right 08/01/2016   08/17/16   TOTAL KNEE ARTHROPLASTY Right 08/01/2016   Procedure: TOTAL KNEE ARTHROPLASTY;  Surgeon: Renette Butters, MD;  Location: [facility];  Service: Orthopedics;  Laterality: Right;   UPPER GASTROINTESTINAL ENDOSCOPY     UPPER LEG SOFT TISSUE BIOPSY Left 2012   lthigh       Family History  Problem Relation Age of Onset   Stroke Father  Diabetes Father    Cancer Father        brain cancer   Asthma Father    Hypertension Sister    Cancer Maternal Uncle        prostate cancer   Asthma Sister    Colon cancer Neg Hx    Colon polyps Neg Hx    Esophageal cancer Neg Hx    Stomach cancer Neg Hx    Rectal cancer Neg Hx     Social History   Tobacco Use   Smoking status: Former    Packs/day: 1.50    Years: 23.00    Pack years: 34.50    Types: Cigars, Cigarettes    Quit date: 05/22/2010    Years since quitting: 10.8   Smokeless tobacco: Never  Vaping Use   Vaping Use: Never used  Substance Use Topics   Alcohol use: Not Currently    Alcohol/week: 0.0 standard drinks    Comment: 1-2 drinks a week    Drug use: No    Home Medications Prior to Admission medications   Medication Sig Start Date End Date Taking? Authorizing Provider  diphenhydrAMINE (BENADRYL) 25 MG tablet Take 1 tablet (25 mg total) by mouth every 6 (six) hours as needed.  04/11/21  Yes Donda Friedli, PA-C  EPINEPHrine 0.3 mg/0.3 mL IJ SOAJ injection Inject 0.3 mg into the muscle as needed for anaphylaxis. 04/11/21  Yes Rosanna Bickle, PA-C  famotidine (PEPCID) 20 MG tablet Take 1 tablet (20 mg total) by mouth 2 (two) times daily. 04/11/21  Yes Xavious Sharrar, PA-C  albuterol (PROVENTIL) (2.5 MG/3ML) 0.083% nebulizer solution Take 3 mLs (2.5 mg total) by nebulization every 6 (six) hours as needed for wheezing or shortness of breath. 01/28/21   Campbell Riches, MD  albuterol (VENTOLIN HFA) 108 (90 Base) MCG/ACT inhaler Inhale 2 puffs into the lungs every 6 (six) hours as needed for wheezing or shortness of breath. 01/28/21   Campbell Riches, MD  ALPRAZolam (XANAX) 0.5 MG tablet TAKE 1 TABLET(0.5 MG) BY MOUTH TWICE DAILY AS NEEDED FOR SLEEP 03/21/21   Ward Givens, NP  Beclomethasone Dipropionate (QNASL) 80 MCG/ACT AERS Place 2 sprays into the nose daily. 01/04/21   Rigoberto Noel, MD  bictegravir-emtricitabine-tenofovir AF (BIKTARVY) 50-200-25 MG TABS tablet Take 1 tablet by mouth daily. 11/11/20   Kuppelweiser, Cassie L, RPH-CPP  Budeson-Glycopyrrol-Formoterol (BREZTRI AEROSPHERE) 160-9-4.8 MCG/ACT AERO Inhale 2 puffs into the lungs in the morning and at bedtime. 01/04/21   Rigoberto Noel, MD  cetirizine (ZYRTEC) 10 MG tablet Take 1 tablet (10 mg total) by mouth daily. 10/18/20 10/18/21  Biagio Borg, MD  cyanocobalamin (,VITAMIN B-12,) 1000 MCG/ML injection Inject 1 mL (1,000 mcg total) into the muscle every 30 (thirty) days. 11/12/20   Biagio Borg, MD  cyclobenzaprine (FLEXERIL) 5 MG tablet Take 5 mg by mouth 2 (two) times daily. 08/12/20   [provider]  diclofenac Sodium (VOLTAREN) 1 % GEL APPLY 2 GRAMS EXTERNALLY TO THE AFFECTED AREA TWICE DAILY AS NEEDED FOR PAIN Patient taking differently: Apply 2 g topically 2 (two) times daily as needed (pain). APPLY 2 GRAMS EXTERNALLY TO THE AFFECTED AREA TWICE DAILY AS NEEDED FOR PAIN 06/16/19   Campbell Riches, MD  EPINEPHrine 0.3 mg/0.3 mL IJ SOAJ injection Inject 0.3 mg into the muscle as needed for anaphylaxis. 10/18/20   Biagio Borg, MD  gabapentin (NEURONTIN) 300 MG capsule Take 1 capsule (300 mg total) by mouth 4 (four) times daily  as needed (pain). 05/04/20   Dohmeier, Asencion Partridge, MD  Glucos-Chond-Hyal Ac-Ca Fructo (MOVE FREE JOINT HEALTH ADVANCE PO) Take 1 tablet by mouth daily.    [provider]  HYDROcodone bit-homatropine (HYCODAN) 5-1.5 MG/5ML syrup Take 5 mLs by mouth every 6 (six) hours as needed for up to 10 days. 04/05/21 04/15/21  Biagio Borg, MD  hydrOXYzine (ATARAX/VISTARIL) 25 MG tablet Take 1 tablet (25 mg total) by mouth at bedtime. 05/24/20   Dohmeier, Asencion Partridge, MD  levofloxacin (LEVAQUIN) 500 MG tablet Take 1 tablet (500 mg total) by mouth daily for 10 days. 04/05/21 04/15/21  Biagio Borg, MD  methocarbamol (ROBAXIN) 500 MG tablet Take 500 mg by mouth 4 (four) times daily.    [provider]  metoprolol succinate (TOPROL-XL) 100 MG 24 hr tablet  12/13/20   [provider]  montelukast (SINGULAIR) 10 MG tablet Take 10 mg by mouth daily. 11/15/20   [provider]  Multiple Vitamin (MULTIVITAMIN WITH MINERALS) TABS tablet Take 1 tablet by mouth daily after breakfast.     [provider]  mupirocin ointment (BACTROBAN) 2 % APPLY EXTERNALLY TO THE AFFECTED AREA TWICE DAILY FOR 3 WEEKS, PLACE COTTON BETWEEN TOES TO ALLOW BETTER AERATION AS NEEDED 03/24/20   Campbell Riches, MD  omeprazole (PRILOSEC) 40 MG capsule Take 40 mg by mouth daily.    [provider]  OVER THE COUNTER MEDICATION Take 2 capsules by mouth daily. Zhou: thyroid supplement    [provider]  predniSONE (DELTASONE) 10 MG tablet 3 tabs by mouth per day for 3 days,2tabs per day for 3 days,1tab per day for 3 days 04/05/21   Biagio Borg, MD  tizanidine (ZANAFLEX) 6 MG capsule Take 1 capsule (6 mg total) by mouth 3 (three) times daily. 06/15/20   Dohmeier,  Asencion Partridge, MD  torsemide (DEMADEX) 20 MG tablet Take 10 mg by mouth See admin instructions. Tues Thurs and Sat    [provider]  Ubrogepant (UBRELVY) 50 MG TABS Take 1 tablet at the onset of migraine can repeat in 2 hours if needed. 03/17/21   Ward Givens, NP  zolpidem (AMBIEN) 5 MG tablet Take 1-2 tablets (5-10 mg total) by mouth at bedtime as needed for sleep. Patient not taking: Reported on 04/23/2019 12/21/16 05/19/19  Mignon Pine, DO    Allergies    Chocolate, Descovy [emtricitabine-tenofovir af], Genvoya [elviteg-cobic-emtricit-tenofaf], Morphine and related, Oxycodone-acetaminophen, Penicillins, Sulfa antibiotics, Tomato, Vancomycin, Carvedilol, Flagyl [metronidazole], Milk-related compounds, Morphine, Penicillin g, Toradol [ketorolac tromethamine], Acetaminophen, and Tramadol  Review of Systems   Review of Systems  Constitutional:  Negative for chills and fever.  HENT:  Negative for trouble swallowing and voice change.        + throat swelling  Respiratory:  Positive for shortness of breath.   Gastrointestinal:  Negative for nausea and vomiting.  Skin:  Negative for rash.  All other systems reviewed and are negative.  Physical Exam Updated Vital Signs BP (!) 126/91 (BP Location: Left Arm)   Pulse 74   Temp 97.9 F (36.6 C) (Oral)   Resp 17   SpO2 96%   Physical Exam Vitals and nursing note reviewed.  Constitutional:      Appearance: He is not ill-appearing.  HENT:     Head: Normocephalic and atraumatic.     Mouth/Throat:     Pharynx: Oropharynx is clear. No posterior oropharyngeal erythema.     Comments: Uvula is midline. No posterior oropharyngeal erythema or edema. Phonating normally.  Eyes:     Conjunctiva/sclera: Conjunctivae normal.  Cardiovascular:     Rate and Rhythm: Normal rate and regular rhythm.  Pulmonary:     Effort: Pulmonary effort is normal.     Breath sounds: Normal breath sounds. No wheezing, rhonchi or rales.     Comments:  Speaking in full sentences without difficulty. Satting 96% on RA. LCTAB.  Abdominal:     Palpations: Abdomen is soft.     Tenderness: There is no abdominal tenderness.  Musculoskeletal:     Cervical back: Neck supple.  Skin:    General: Skin is warm and dry.     Coloration: Skin is not jaundiced.  Neurological:     Mental Status: He is alert.    ED Results / Procedures / Treatments   Labs (all labs ordered are listed, but only abnormal results are displayed) Labs Reviewed - No data to display  EKG None  Radiology No results found.  Procedures Procedures   Medications Ordered in ED Medications - No data to display  ED Course  I have reviewed the triage vital signs and the nursing notes.  Pertinent labs & imaging results that were available during my care of the patient were reviewed by me and considered in my medical decision making (see chart for details).    MDM Rules/Calculators/A&P                           59 year old male who presents to the ED today secondary to allergic reaction from chips and if he ate earlier today.  Received Benadryl, epi, Solu-Medrol, Zofran prior to arrival.  On arrival patient appeared to be in no acute distress and was triaged by myself.  He has been monitored for over 4 hours secondary to appendectomy without worsening condition.  He reports improvement in his symptoms secondary to medication.  Will be discharged home at this time with prescription EpiPen, Benadryl, Pepcid and PCP follow-up.  This note was prepared using Dragon voice recognition software and may include unintentional dictation errors due to the inherent limitations of voice recognition software.  Final Clinical Impression(s) / ED Diagnoses Final diagnoses:  Allergic reaction, initial encounter    Rx / DC Orders ED Discharge Orders          Ordered    EPINEPHrine 0.3 mg/0.3 mL IJ SOAJ injection  As needed        04/11/21 1346    diphenhydrAMINE (BENADRYL) 25 MG  tablet  Every 6 hours PRN        04/11/21 1346    famotidine (PEPCID) 20 MG tablet  2 times daily        04/11/21 1346              Eustaquio Maize, PA-C 04/11/21 [place], [place], DO 04/11/21 1410

## 2021-04-14 DIAGNOSIS — N281 Cyst of kidney, acquired: Secondary | ICD-10-CM | POA: Diagnosis not present

## 2021-04-17 ENCOUNTER — Encounter: Payer: Self-pay | Admitting: Internal Medicine

## 2021-04-19 MED ORDER — HYDROCOD POLST-CPM POLST ER 10-8 MG/5ML PO SUER: 5.0000 mL | Freq: Two times a day (BID) | ORAL | 0 refills | Status: DC | PRN

## 2021-04-20 ENCOUNTER — Other Ambulatory Visit: Payer: Self-pay | Admitting: Infectious Diseases

## 2021-04-20 ENCOUNTER — Other Ambulatory Visit: Payer: Self-pay

## 2021-04-20 DIAGNOSIS — L989 Disorder of the skin and subcutaneous tissue, unspecified: Secondary | ICD-10-CM

## 2021-04-20 MED ORDER — OMEPRAZOLE 40 MG PO CPDR: 40.0000 mg | DELAYED_RELEASE_CAPSULE | Freq: Two times a day (BID) | ORAL | 2 refills | Status: DC

## 2021-04-20 NOTE — Telephone Encounter (Signed)
Sorry, I dont feel comfortable with this, unless I could see a copy of the letter from [place] regarding this request, thanks

## 2021-04-20 NOTE — Telephone Encounter (Signed)
Please advise on refill.

## 2021-04-21 ENCOUNTER — Encounter: Payer: Self-pay | Admitting: Infectious Diseases

## 2021-04-26 DIAGNOSIS — M542 Cervicalgia: Secondary | ICD-10-CM | POA: Diagnosis not present

## 2021-04-26 DIAGNOSIS — M4322 Fusion of spine, cervical region: Secondary | ICD-10-CM | POA: Diagnosis not present

## 2021-04-27 DIAGNOSIS — L600 Ingrowing nail: Secondary | ICD-10-CM | POA: Diagnosis not present

## 2021-05-10 ENCOUNTER — Ambulatory Visit: Payer: PRIVATE HEALTH INSURANCE | Admitting: Internal Medicine

## 2021-05-11 ENCOUNTER — Ambulatory Visit: Payer: PRIVATE HEALTH INSURANCE | Admitting: Internal Medicine

## 2021-05-13 ENCOUNTER — Encounter: Payer: Self-pay | Admitting: Internal Medicine

## 2021-05-13 ENCOUNTER — Ambulatory Visit (INDEPENDENT_AMBULATORY_CARE_PROVIDER_SITE_OTHER): Payer: PRIVATE HEALTH INSURANCE | Admitting: Internal Medicine

## 2021-05-13 ENCOUNTER — Other Ambulatory Visit: Payer: Self-pay

## 2021-05-13 VITALS — BP 118/68 | HR 71 | Ht 70.0 in | Wt 228.0 lb

## 2021-05-13 DIAGNOSIS — E782 Mixed hyperlipidemia: Secondary | ICD-10-CM | POA: Diagnosis not present

## 2021-05-13 DIAGNOSIS — I10 Essential (primary) hypertension: Secondary | ICD-10-CM

## 2021-05-13 DIAGNOSIS — J449 Chronic obstructive pulmonary disease, unspecified: Secondary | ICD-10-CM | POA: Diagnosis not present

## 2021-05-13 DIAGNOSIS — E559 Vitamin D deficiency, unspecified: Secondary | ICD-10-CM | POA: Diagnosis not present

## 2021-05-13 DIAGNOSIS — L600 Ingrowing nail: Secondary | ICD-10-CM | POA: Diagnosis not present

## 2021-05-13 NOTE — Patient Instructions (Signed)
Please take OTC Vitamin D3 at 2000 units per day, indefinitely  Please continue all other medications as before, and refills have been done if requested.  Please have the pharmacy call with any other refills you may need.  Please continue your efforts at being more active, low cholesterol diet, and weight control.  Please keep your appointments with your specialists as you may have planned  You are given the work note today  Please make an Appointment to return in 6 months, or sooner if needed

## 2021-05-13 NOTE — Assessment & Plan Note (Signed)
Last vitamin D Lab Results  Component Value Date   VD25OH 21.81 (L) 12/27/2020   Low, to start oral replacement

## 2021-05-13 NOTE — Assessment & Plan Note (Signed)
Stable, continue current tx - albuterl hfa prn and neb qid prn

## 2021-05-13 NOTE — Assessment & Plan Note (Signed)
Lab Results  Component Value Date   LDLCALC 123 (H) 12/27/2020   Unocntrolled, goal ldl < 100, pt to continue current low chol diet - declines statin

## 2021-05-13 NOTE — Progress Notes (Signed)
Patient ID: Casey Brennan, male   DOB: 09-24-1960, 60 y.o.   MRN: [id number]        Chief Complaint: follow up HTN, HLD and low vit d, copd       HPI:  Casey Brennan is a 60 y.o. male here with wife, overall doing ok, Pt denies chest pain, increased sob or doe, wheezing, orthopnea, PND, increased LE swelling, palpitations, dizziness or syncope.   Pt denies polydipsia, polyuria, or lnew focal neuro s/s.   Pt denies fever, wt loss, night sweats, loss of appetite, or other constitutional symptoms  Still working truck driving.  Declines statin start for HLD, prefers to continue lower chol diet.  Not taking Vit D.  No new complaints       Wt Readings from Last 3 Encounters:  05/13/21 228 lb (103.4 kg)  04/08/21 224 lb 9.6 oz (101.9 kg)  04/05/21 229 lb (103.9 kg)   BP Readings from Last 3 Encounters:  05/13/21 118/68  04/11/21 (!) 134/94  04/08/21 132/83         Past Medical History:  Diagnosis Date   Arthritis    r knee    Asthma    very rare   Axillary lymphadenopathy    Bronchitis    Chronic anemia    normocytic   Chronic folliculitis    Elevated PSA    Environmental and seasonal allergies    GERD (gastroesophageal reflux disease)    Patient denies   Gross hematuria    H/O pericarditis    2010--  myopercarditis--  resolved   HCAP (healthcare-associated pneumonia) 11/19/2014   Headache(784.0)    HX SEVERE FRONTAL HA'S   Hiatal hernia    History of concussion    2012  &  2013  RESIDUAL HA'S --  RESOLVED   History of gastric ulcer    History of kidney stones    History of MRSA infection 2010   infected boil   HIV (human immunodeficiency virus infection) ([place]) 1988   Hypertension    Internal hemorrhoids    Lytic bone lesion of hip    WORK-UP DONE BY ONCOLOGIST DR HA --  NOT MALIGNANT   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   Pneumonia    hx of   Post concussion syndrome    resolved   Prostate cancer ([place]) 04/25/13   gleason 3+3=6, volume 30 gm   Sinusitis,  chronic 05/14/2015   Ulcer    hx of gastric   Wears glasses    Past Surgical History:  Procedure Laterality Date   ACDCF  09/29/2020   ANTERIOR CERVICAL DECOMPRESSION/DISCECTOMY FUSION 4 LEVELS N/A 09/29/2020   Procedure: ANTERIOR CERVICAL DECOMPRESSION FUSION CERVICAL 3-CERVICAL 4 , CERVICAL 4 - CERVICAL 5, CERVICAL 5 - CERVICAL 6 WITH INSTRUMENTATION AND ALLOGRAFT;  Surgeon: Phylliss Bob, MD;  Location: [facility];  Service: Orthopedics;  Laterality: N/A;   CARDIAC CATHETERIZATION  03-23-2009  DR COOPER   NORMAL CORONARY ARTERIES   CHOLECYSTECTOMY N/A 11/06/2014   Procedure: LAPAROSCOPIC CHOLECYSTECTOMY WITH INTRAOPERATIVE CHOLANGIOGRAM;  Surgeon: Judeth Horn, MD;  Location: [facility];  Service: General;  Laterality: N/A;   COLONOSCOPY  12/26/2011   Procedure: COLONOSCOPY;  Surgeon: Lear Ng, MD;  Location: [facility];  Service: Endoscopy;  Laterality: N/A;   COLONOSCOPY     COLONOSCOPY WITH PROPOFOL N/A 07/29/2014   Procedure: COLONOSCOPY WITH PROPOFOL;  Surgeon: Lear Ng, MD;  Location: [facility];  Service: Endoscopy;  Laterality: N/A;   CYSTOSCOPY N/A  05/18/2018   Procedure: CYSTOSCOPY REMOVAL FOREIGN BODY;  Surgeon: Kathie Rhodes, MD;  Location: [facility];  Service: Urology;  Laterality: N/A;   CYSTOSCOPY/RETROGRADE/URETEROSCOPY Bilateral 04/25/2013   Procedure: CYSTOSCOPY/ BILATERAL RETROGRADES; BLADDER BIOPSIES;  Surgeon: Alexis Frock, MD;  Location[facility];  Service: Urology;  Laterality: Bilateral;   DENTAL EXAMINATION UNDER ANESTHESIA     ESOPHAGOGASTRODUODENOSCOPY  12/26/2011   Procedure: ESOPHAGOGASTRODUODENOSCOPY (EGD);  Surgeon: Lear Ng, MD;  Location: [facility];  Service: Endoscopy;  Laterality: N/A;   ESOPHAGOGASTRODUODENOSCOPY (EGD) WITH PROPOFOL N/A 07/29/2014   Procedure: ESOPHAGOGASTRODUODENOSCOPY (EGD) WITH PROPOFOL;  Surgeon: Lear Ng, MD;  Location: [facility];  Service: Endoscopy;  Laterality: N/A;    EXCISION CHRONIC LEFT BREAST ABSCESS  09-21-2010   EXCISIONAL BX LEFT BREAST MASS/  I  &  D LEFT BREAST ABSCESS  03-24-2009   KNEE ARTHROSCOPY Right 1985   left axilla biopsy  04/2013   left hip biopsy  10/2012   LYMPHADENECTOMY Bilateral 08/18/2013   Procedure: LYMPHADENECTOMY "PELVIC LYMPH NODE DISSECTION";  Surgeon: Alexis Frock, MD;  [facility]: [facility];  Service: Urology;  Laterality: Bilateral;   PROSTATE BIOPSY N/A 04/25/2013   Procedure: BIOPSY TRANSRECTAL ULTRASONIC PROSTATE (TUBP);  Surgeon: Alexis Frock, MD;  [facility];  Service: Urology;  Laterality: N/A;   ROBOT ASSISTED LAPAROSCOPIC RADICAL PROSTATECTOMY N/A 08/18/2013   Procedure: ROBOTIC ASSISTED LAPAROSCOPIC RADICAL PROSTATECTOMY;  Surgeon: Alexis Frock, MD;  [facility];  Service: Urology;  Laterality: N/A;   TOTAL KNEE ARTHROPLASTY Right 08/01/2016   08/17/16   TOTAL KNEE ARTHROPLASTY Right 08/01/2016   Procedure: TOTAL KNEE ARTHROPLASTY;  Surgeon: Renette Butters, MD;  Location: [facility];  Service: Orthopedics;  Laterality: Right;   UPPER GASTROINTESTINAL ENDOSCOPY     UPPER LEG SOFT TISSUE BIOPSY Left 2012   lthigh    reports that he quit smoking about 10 years ago. His smoking use included cigars and cigarettes. He has a 34.50 pack-year smoking history. He has never used smokeless tobacco. He reports that he does not currently use alcohol. He reports that he does not use drugs. family history includes Asthma in his father and sister; Cancer in his father and maternal uncle; Diabetes in his father; Hypertension in his sister; Stroke in his father. Allergies  Allergen Reactions   Amoxapine Shortness Of Breath   Chocolate Shortness Of Breath and Other (See Comments)    Asthma attack, welts   Descovy [Emtricitabine-Tenofovir Af] Shortness Of Breath    Nasuea, vomiiting, diarrhea, sob, whelps, throat closes   Genvoya [Elviteg-Cobic-Emtricit-Tenofaf] Shortness Of Breath    Nausea, vomitting,  diarreha, sob, rash, throat closing   Morphine And Related Shortness Of Breath, Itching and Other (See Comments)     chest pain   Oxycodone-Acetaminophen Shortness Of Breath   Penicillins Shortness Of Breath and Rash    Has patient had a PCN reaction causing immediate rash, facial/tongue/throat swelling, SOB or lightheadedness with hypotension: Yes Has patient had a PCN reaction causing severe rash involving mucus membranes or skin necrosis: No Has patient had a PCN reaction that required hospitalization No Has patient had a PCN reaction occurring within the last 10 years: Yes If all of the above answers are "NO", then may proceed with Cephalosporin use.   Sulfa Antibiotics Anaphylaxis and Other (See Comments)   Tomato Shortness Of Breath   Vancomycin Shortness Of Breath and Other (See Comments)   Darunavir Itching   Carvedilol     LEG SWELLING, DIZZNESS  Flagyl [Metronidazole] Itching   Milk-Related Compounds    Morphine Other (See Comments)   Penicillin G Other (See Comments)   Toradol [Ketorolac Tromethamine] Nausea And Vomiting    itching   Acetaminophen Rash and Other (See Comments)    Pt tolerates percocet and also plain APAP without rash   Tramadol Itching, Nausea And Vomiting and Other (See Comments)   Current Outpatient Medications on File Prior to Visit  Medication Sig Dispense Refill   albuterol (PROVENTIL) (2.5 MG/3ML) 0.083% nebulizer solution Take 3 mLs (2.5 mg total) by nebulization every 6 (six) hours as needed for wheezing or shortness of breath. 75 mL 3   albuterol (VENTOLIN HFA) 108 (90 Base) MCG/ACT inhaler Inhale 2 puffs into the lungs every 6 (six) hours as needed for wheezing or shortness of breath. 36 g 3   ALPRAZolam (XANAX) 0.5 MG tablet TAKE 1 TABLET(0.5 MG) BY MOUTH TWICE DAILY AS NEEDED FOR SLEEP 180 tablet 0   Beclomethasone Dipropionate (QNASL) 80 MCG/ACT AERS Place 2 sprays into the nose daily. 10.6 g 2   bictegravir-emtricitabine-tenofovir AF  (BIKTARVY) 50-200-25 MG TABS tablet Take 1 tablet by mouth daily. 30 tablet 11   Budeson-Glycopyrrol-Formoterol (BREZTRI AEROSPHERE) 160-9-4.8 MCG/ACT AERO Inhale 2 puffs into the lungs in the morning and at bedtime. 10.7 g 5   cetirizine (ZYRTEC) 10 MG tablet Take 1 tablet (10 mg total) by mouth daily. 90 tablet 3   cyanocobalamin (,VITAMIN B-12,) 1000 MCG/ML injection Inject 1 mL (1,000 mcg total) into the muscle every 30 (thirty) days. 3 mL 3   cyclobenzaprine (FLEXERIL) 5 MG tablet Take 5 mg by mouth 2 (two) times daily.     diphenhydrAMINE (BENADRYL) 25 MG tablet Take 1 tablet (25 mg total) by mouth every 6 (six) hours as needed. 30 tablet 0   EPINEPHrine 0.3 mg/0.3 mL IJ SOAJ injection Inject 0.3 mg into the muscle as needed for anaphylaxis. 1 each 5   EPINEPHrine 0.3 mg/0.3 mL IJ SOAJ injection Inject 0.3 mg into the muscle as needed for anaphylaxis. 1 each 0   famotidine (PEPCID) 20 MG tablet Take 1 tablet (20 mg total) by mouth 2 (two) times daily. 30 tablet 0   gabapentin (NEURONTIN) 300 MG capsule Take 1 capsule (300 mg total) by mouth 4 (four) times daily as needed (pain). 120 capsule 11   Glucos-Chond-Hyal Ac-Ca Fructo (MOVE FREE JOINT HEALTH ADVANCE PO) Take 1 tablet by mouth daily.     hydrOXYzine (ATARAX/VISTARIL) 25 MG tablet Take 1 tablet (25 mg total) by mouth at bedtime. 90 tablet 3   methocarbamol (ROBAXIN) 500 MG tablet Take 500 mg by mouth 4 (four) times daily.     metoprolol succinate (TOPROL-XL) 100 MG 24 hr tablet      montelukast (SINGULAIR) 10 MG tablet Take 10 mg by mouth daily.     Multiple Vitamin (MULTIVITAMIN WITH MINERALS) TABS tablet Take 1 tablet by mouth daily after breakfast.      mupirocin ointment (BACTROBAN) 2 % APPLY EXTERNALLY TO THE AFFECTED AREA TWICE DAILY FOR 3 WEEKS, PLACE COTTON BETWEEN TOES TO ALLOW BETTER AERATION AS NEEDED 66 g 1   omeprazole (PRILOSEC) 40 MG capsule Take 1 capsule (40 mg total) by mouth 2 (two) times daily. 180 capsule 2   OVER  THE COUNTER MEDICATION Take 2 capsules by mouth daily. Zhou: thyroid supplement     tizanidine (ZANAFLEX) 6 MG capsule Take 1 capsule (6 mg total) by mouth 3 (three) times daily. 90 capsule 11  torsemide (DEMADEX) 20 MG tablet Take 10 mg by mouth See admin instructions. Tues Thurs and Sat     Ubrogepant (UBRELVY) 50 MG TABS Take 1 tablet at the onset of migraine can repeat in 2 hours if needed. 15 tablet 3   [DISCONTINUED] zolpidem (AMBIEN) 5 MG tablet Take 1-2 tablets (5-10 mg total) by mouth at bedtime as needed for sleep. (Patient not taking: Reported on 04/23/2019) 30 tablet 2   No current facility-administered medications on file prior to visit.        ROS:  All others reviewed and negative.  Objective        PE:  BP 118/68 (BP Location: Left Arm, Patient Position: Sitting, Cuff Size: Large)   Pulse 71   Ht 5\' 10"  (1.778 m)   Wt 228 lb (103.4 kg)   SpO2 93%   BMI 32.71 kg/m                 Constitutional: Pt appears in NAD               HENT: Head: NCAT.                Right Ear: External ear normal.                 Left Ear: External ear normal.                Eyes: . Pupils are equal, round, and reactive to light. Conjunctivae and EOM are normal               Nose: without d/c or deformity               Neck: Neck supple. Gross normal ROM               Cardiovascular: Normal rate and regular rhythm.                 Pulmonary/Chest: Effort normal and breath sounds without rales or wheezing.                Abd:  Soft, NT, ND, + BS, no organomegaly               Neurological: Pt is alert. At baseline orientation, motor grossly intact               Skin: Skin is warm. No rashes, no other new lesions, LE edema - none               Psychiatric: Pt behavior is normal without agitation   Micro: none  Cardiac tracings I have personally interpreted today:  none  Pertinent Radiological findings (summarize): none   Lab Results  Component Value Date   WBC 6.8 12/27/2020   HGB  13.0 12/27/2020   HCT 39.3 12/27/2020   PLT 291.0 12/27/2020   GLUCOSE 95 12/27/2020   CHOL 183 12/27/2020   TRIG 133.0 12/27/2020   HDL 33.10 (L) 12/27/2020   LDLCALC 123 (H) 12/27/2020   ALT 20 12/27/2020   AST 18 12/27/2020   NA 138 12/27/2020   K 4.1 12/27/2020   CL 103 12/27/2020   CREATININE 1.25 12/27/2020   BUN 11 12/27/2020   CO2 28 12/27/2020   TSH 1.63 12/27/2020   PSA 0.00 Repeated and verified X2. (L) 12/27/2020   INR 1.0 09/27/2020   HGBA1C (L) 10/10/2010    4.3 (NOTE)  According to the ADA Clinical Practice Recommendations for 2011, when HbA1c is used as a screening test:   >=6.5%   Diagnostic of Diabetes Mellitus           (if abnormal result  is confirmed)  5.7-6.4%   Increased risk of developing Diabetes Mellitus  References:Diagnosis and Classification of Diabetes Mellitus,Diabetes TMAU,6333,54(TGYBW 1):S62-S69 and Standards of Medical Care in         Diabetes - 2011,Diabetes LSLH,7342,87  (Suppl 1):S11-S61.   MICROALBUR 16.88 (H) 10/05/2011   Assessment/Plan:  Casey Brennan is a 60 y.o. American [place] or [place] Native [3] Asian [4] Black or African American [2] White or Caucasian [1] male with  has a past medical history of Arthritis, Asthma, Axillary lymphadenopathy, Bronchitis, Chronic anemia, Chronic folliculitis, Elevated PSA, Environmental and seasonal allergies, GERD (gastroesophageal reflux disease), Gross hematuria, H/O pericarditis, HCAP (healthcare-associated pneumonia) (11/19/2014), Headache(784.0), Hiatal hernia, History of concussion, History of gastric ulcer, History of kidney stones, History of MRSA infection (2010), HIV (human immunodeficiency virus infection) ([place]) (1988), Hypertension, Internal hemorrhoids, Lytic bone lesion of hip, 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  COPD GOLD III Stable, continue current tx - albuterl hfa prn and neb qid prn  Primary hypertension BP Readings from Last 3 Encounters:  05/13/21 118/68  04/11/21 (!) 134/94  04/08/21 132/83   Stable, pt to continue medical treatment toprol   Mixed hyperlipidemia Lab Results  Component Value Date   LDLCALC 123 (H) 12/27/2020   Unocntrolled, goal ldl < 100, pt to continue current low chol diet - declines statin   Vitamin D deficiency Last vitamin D Lab Results  Component Value Date   VD25OH 21.81 (L) 12/27/2020   Low, to start oral replacement  Followup: Return in about 6 months (around 11/10/2021).  Cathlean Cower, MD 05/13/2021 11:48 PM [facility]

## 2021-05-13 NOTE — Assessment & Plan Note (Signed)
BP Readings from Last 3 Encounters:  05/13/21 118/68  04/11/21 (!) 134/94  04/08/21 132/83   Stable, pt to continue medical treatment toprol

## 2021-05-18 ENCOUNTER — Ambulatory Visit: Payer: Medicare Other | Admitting: Adult Health

## 2021-05-31 ENCOUNTER — Other Ambulatory Visit: Payer: Self-pay | Admitting: Neurology

## 2021-05-31 DIAGNOSIS — G63 Polyneuropathy in diseases classified elsewhere: Secondary | ICD-10-CM

## 2021-05-31 DIAGNOSIS — M5412 Radiculopathy, cervical region: Secondary | ICD-10-CM | POA: Diagnosis not present

## 2021-05-31 DIAGNOSIS — B20 Human immunodeficiency virus [HIV] disease: Secondary | ICD-10-CM

## 2021-06-01 ENCOUNTER — Other Ambulatory Visit: Payer: Self-pay | Admitting: Family Medicine

## 2021-06-01 DIAGNOSIS — M542 Cervicalgia: Secondary | ICD-10-CM

## 2021-06-01 DIAGNOSIS — L600 Ingrowing nail: Secondary | ICD-10-CM | POA: Diagnosis not present

## 2021-06-02 DIAGNOSIS — J4530 Mild persistent asthma, uncomplicated: Secondary | ICD-10-CM | POA: Diagnosis not present

## 2021-06-02 DIAGNOSIS — J301 Allergic rhinitis due to pollen: Secondary | ICD-10-CM | POA: Diagnosis not present

## 2021-06-02 DIAGNOSIS — J3089 Other allergic rhinitis: Secondary | ICD-10-CM | POA: Diagnosis not present

## 2021-06-02 DIAGNOSIS — J019 Acute sinusitis, unspecified: Secondary | ICD-10-CM | POA: Diagnosis not present

## 2021-06-02 DIAGNOSIS — J449 Chronic obstructive pulmonary disease, unspecified: Secondary | ICD-10-CM | POA: Diagnosis not present

## 2021-06-09 ENCOUNTER — Telehealth: Payer: Self-pay | Admitting: Internal Medicine

## 2021-06-09 NOTE — Telephone Encounter (Signed)
Ok to forward to Ms Casey Brennan please

## 2021-06-09 NOTE — Telephone Encounter (Signed)
Patient calling in  Says he received bill from old employer insurance company [employer] which he no longer has bc he is no longer employed w/ previous [place]  Patient currently has [place] Medicaid  Patient would like for visit to be resubmitted to correct billing so his credit wont be affected  Please call patient [phone]

## 2021-06-12 IMAGING — US US RENAL
1 series · 14 of 25 positions shown · non-contrast
Comparison: Prior CT from 07/15/2019

CLINICAL DATA: Initial evaluation for stage 2 chronic kidney
disease.

EXAM:
RENAL / URINARY TRACT ULTRASOUND COMPLETE

[Series 1: us renal · 0.23mm/px · 14 of 40 slices shown]
[im 1/40]
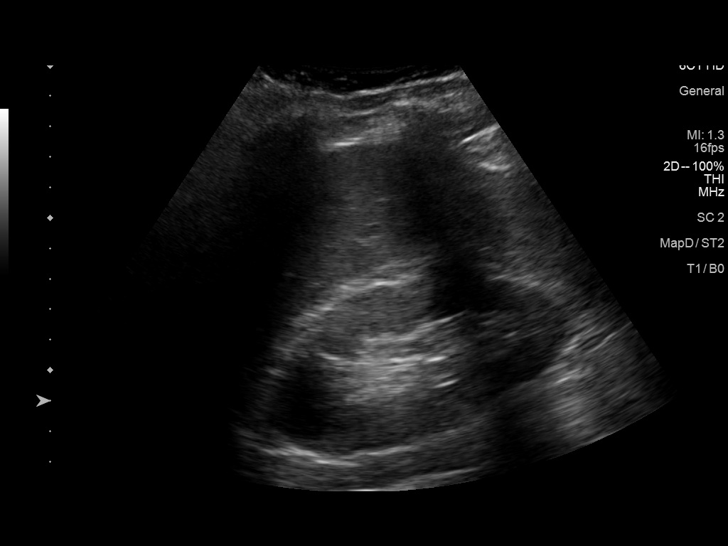
[im 4/40]
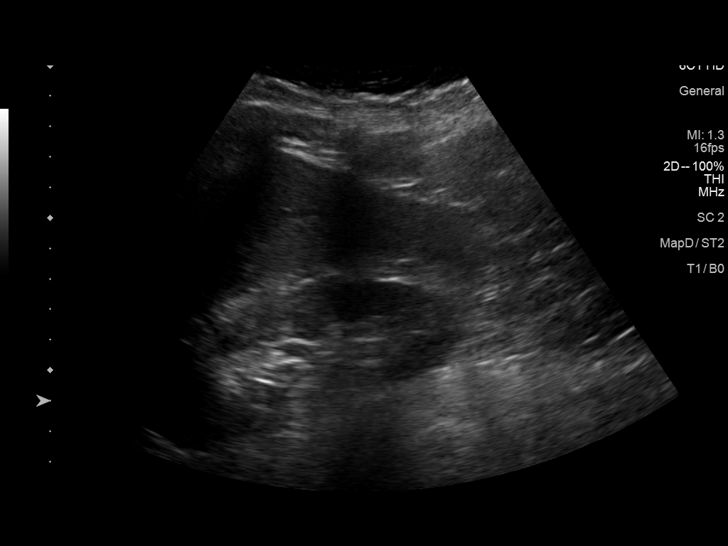
[im 7/40]
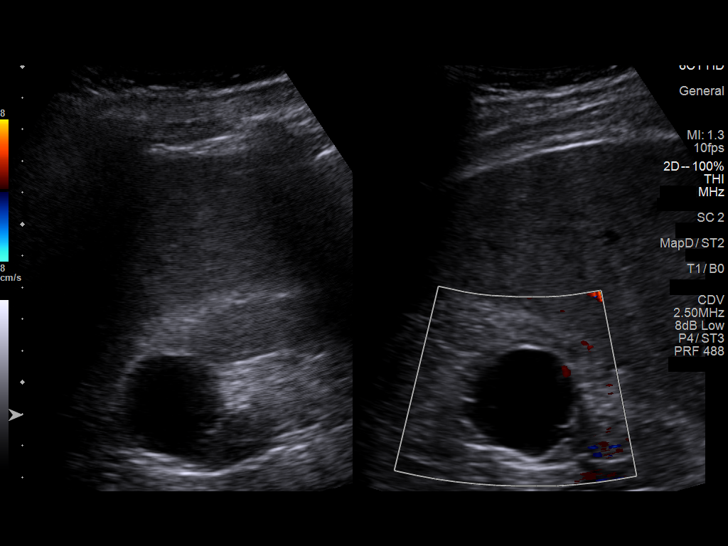
[im 10/40]
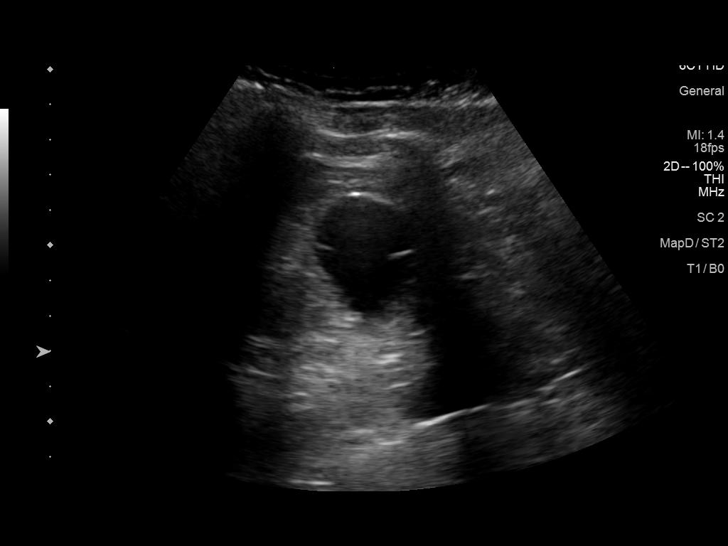
[im 14/40]
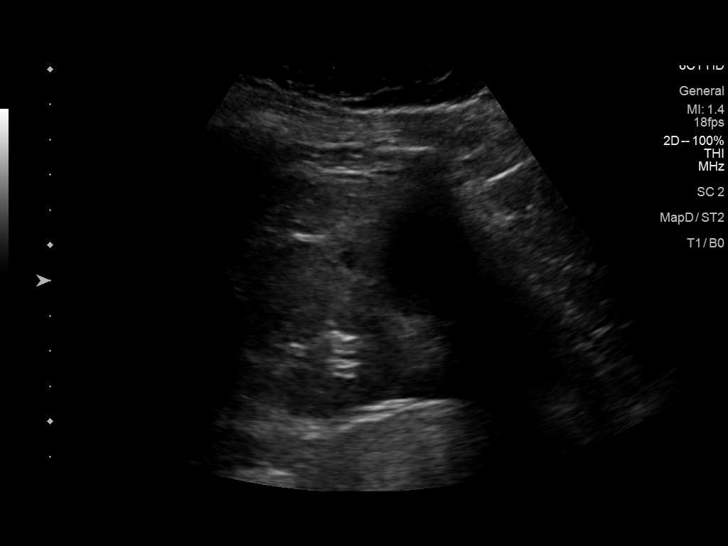
[im 15/40]
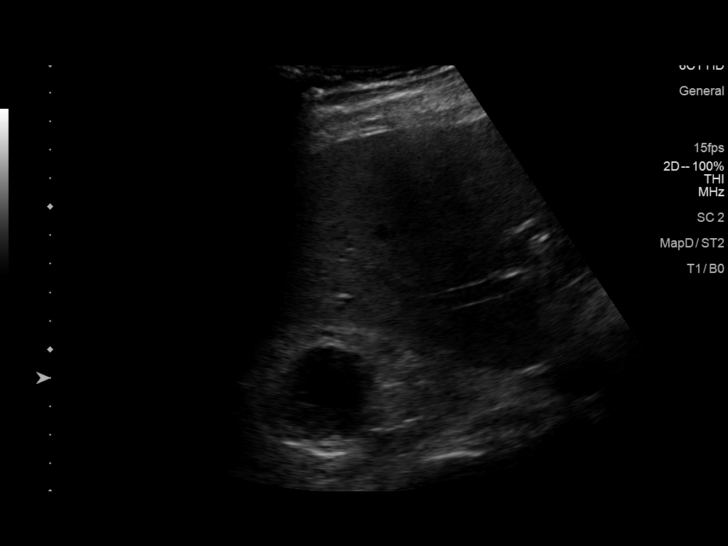
[im 18/40]
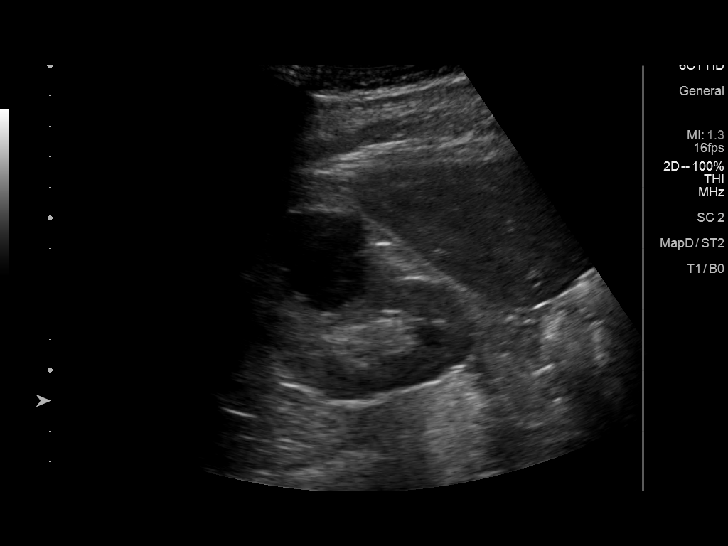
[im 22/40]
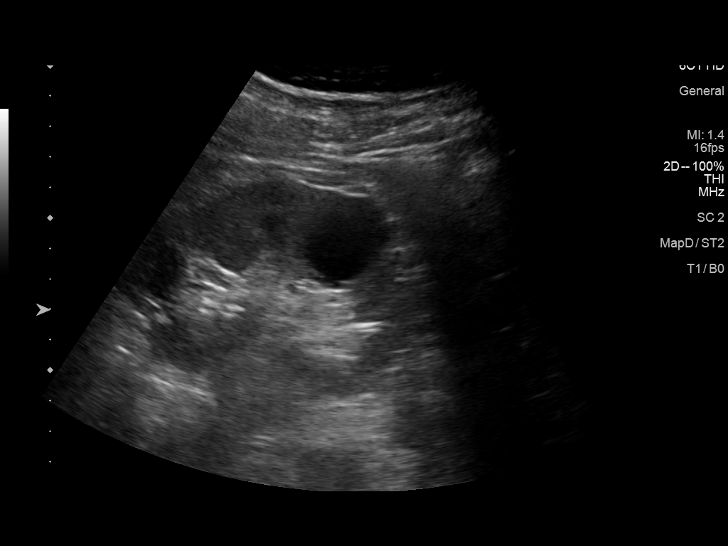
[im 25/40]
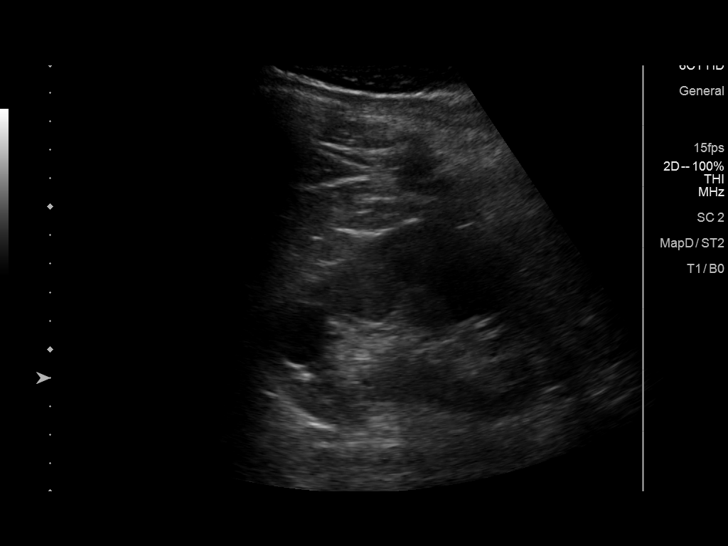
[im 27/40]
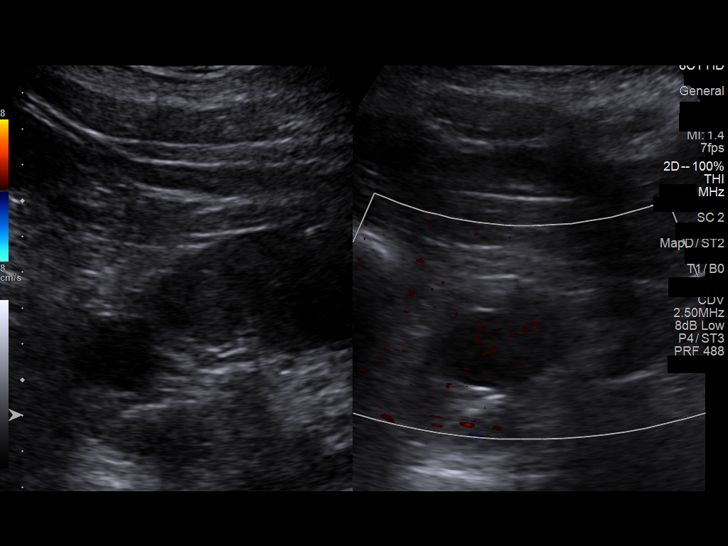
[im 30/40]
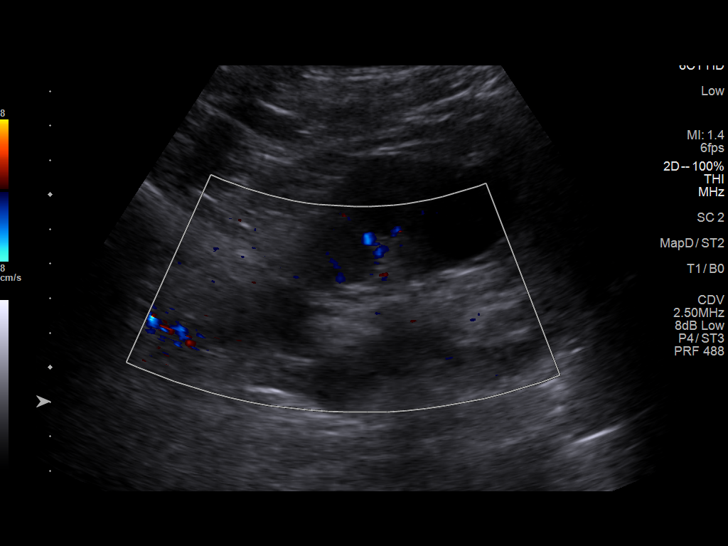
[im 33/40]
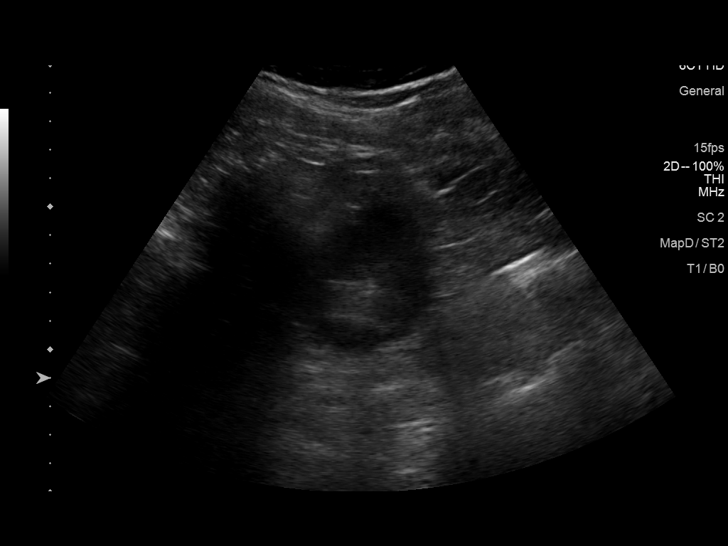
[im 36/40]
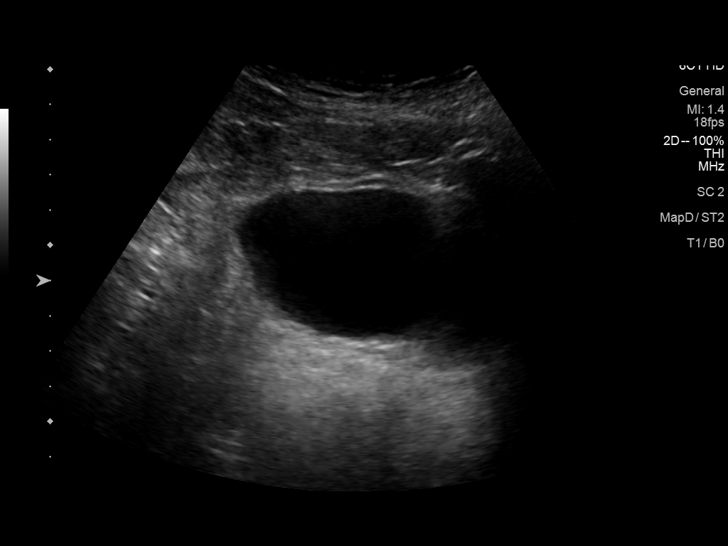
[im 40/40]
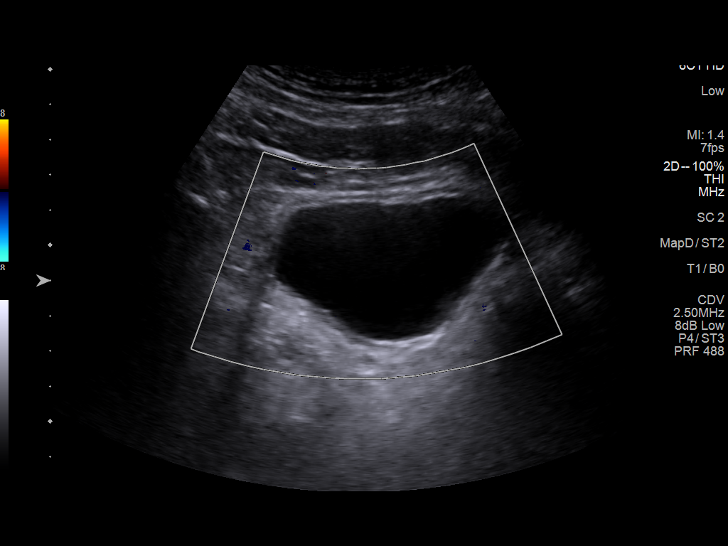

[14 of 25 positions shown; findings below may reference images not displayed]

FINDINGS: Right Kidney:

Renal measurements: 11.8 x 5.0 x 5.4 cm = volume: 167 mL. Renal
echogenicity within normal limits. No nephrolithiasis or
hydronephrosis. 3.3 x 3.4 x 3.6 cm simple cyst present at the upper
pole. 3.4 x 3.4 x 3.1 cm cyst seen at the interpolar region as well.
Cyst is minimally complex with a possible single thin internal
septation, benign in appearance.

Left Kidney:

Renal measurements: 10.3 x 5.9 x 5.4 cm = volume: 171 mL. Renal
echogenicity within normal limits. No nephrolithiasis or
hydronephrosis. 3.3 x 2.8 x 3.2 cm simple cyst present at the lower
pole. Additional 2.6 x 2.1 x 2.3 cm simple cyst present at the upper
pole.

Bladder:

Appears normal for degree of bladder distention.

Other:

None.
IMPRESSION: 1. Renal echogenicity within normal limits. No hydronephrosis.
2. Multiple benign appearing renal cysts as above, largest of which
measures 3.4 cm on the right.

## 2021-06-13 ENCOUNTER — Telehealth: Payer: Self-pay

## 2021-06-13 NOTE — Telephone Encounter (Signed)
Called pt sister back and will be here Wednesday @ 1;00

## 2021-06-13 NOTE — Telephone Encounter (Signed)
I can se him on Wednesday 12/14  Thanks MJP

## 2021-06-15 ENCOUNTER — Ambulatory Visit: Payer: Medicare Other | Admitting: Cardiology

## 2021-06-15 ENCOUNTER — Other Ambulatory Visit: Payer: Self-pay

## 2021-06-15 ENCOUNTER — Encounter: Payer: Self-pay | Admitting: Cardiology

## 2021-06-15 VITALS — BP 128/88 | HR 80 | Temp 96.3°F | Ht 70.0 in | Wt 220.0 lb

## 2021-06-15 DIAGNOSIS — I10 Essential (primary) hypertension: Secondary | ICD-10-CM

## 2021-06-15 MED ORDER — HYDRALAZINE HCL 50 MG PO TABS: 50.0000 mg | ORAL_TABLET | Freq: Three times a day (TID) | ORAL | 3 refills | Status: DC

## 2021-06-15 NOTE — Progress Notes (Signed)
Patient referred by Biagio Borg, MD for hypertension  Subjective:   Casey Brennan, male    DOB: Feb 26, 1961, 60 y.o.   MRN: [id number]   Chief Complaint  Patient presents with   Hypertension   Follow-up    60 year old male with hypertension, hyperlipidemia, HIV.  Blood pressure remains labile, as high as 160s/110s. He recently underwent cervical fusion surgery but may need some more intervention, pending blood pressure control.   Initial consultation HPI 05/2020: Patient was recently seen by [facility] Dr. Eleonore Chiquito.  Prior to that, he was also seen by Drs. Harwani and [place].  His prior cardiac work-up showed coronary CTA with normal coronary arteries and calcium score 0.  He has not tolerated several antihypertensive medications in the past, including amlodipine, nifedipine, HCTZ, losartan, carvedilol, labetalol. He is tolerating metoprolol succinate.  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 He uses Aspirin 325 mg on M,W,F during his trips.     Current Outpatient Medications on File Prior to Visit  Medication Sig Dispense Refill   albuterol (PROVENTIL) (2.5 MG/3ML) 0.083% nebulizer solution Take 3 mLs (2.5 mg total) by nebulization every 6 (six) hours as needed for wheezing or shortness of breath. 75 mL 3   albuterol (VENTOLIN HFA) 108 (90 Base) MCG/ACT inhaler Inhale 2 puffs into the lungs every 6 (six) hours as needed for wheezing or shortness of breath. 36 g 3   ALPRAZolam (XANAX) 0.5 MG tablet TAKE 1 TABLET(0.5 MG) BY MOUTH TWICE DAILY AS NEEDED FOR SLEEP 180 tablet 0   Beclomethasone Dipropionate (QNASL) 80 MCG/ACT AERS Place 2 sprays into the nose daily. 10.6 g 2   bictegravir-emtricitabine-tenofovir AF (BIKTARVY) 50-200-25 MG TABS tablet Take 1 tablet by mouth daily. 30 tablet 11   Budeson-Glycopyrrol-Formoterol (BREZTRI AEROSPHERE) 160-9-4.8 MCG/ACT AERO Inhale 2 puffs into the lungs in the morning and at bedtime. 10.7 g 5    cetirizine (ZYRTEC) 10 MG tablet Take 1 tablet (10 mg total) by mouth daily. 90 tablet 3   Cholecalciferol ([employer] VITAMIN D3) 250 MCG (10000 UT) CAPS Take by mouth.     cyanocobalamin (,VITAMIN B-12,) 1000 MCG/ML injection Inject 1 mL (1,000 mcg total) into the muscle every 30 (thirty) days. 3 mL 3   diphenhydrAMINE (BENADRYL) 25 MG tablet Take 1 tablet (25 mg total) by mouth every 6 (six) hours as needed. 30 tablet 0   EPINEPHrine 0.3 mg/0.3 mL IJ SOAJ injection Inject 0.3 mg into the muscle as needed for anaphylaxis. 1 each 5   EPINEPHrine 0.3 mg/0.3 mL IJ SOAJ injection Inject 0.3 mg into the muscle as needed for anaphylaxis. 1 each 0   gabapentin (NEURONTIN) 300 MG capsule TAKE 1 CAPSULE(300 MG) BY MOUTH FOUR TIMES DAILY AS NEEDED FOR PAIN 120 capsule 5   Glucos-Chond-Hyal Ac-Ca Fructo (MOVE FREE JOINT HEALTH ADVANCE PO) Take 1 tablet by mouth daily.     hydrOXYzine (ATARAX/VISTARIL) 25 MG tablet Take 1 tablet (25 mg total) by mouth at bedtime. 90 tablet 3   methocarbamol (ROBAXIN) 500 MG tablet Take 500 mg by mouth 4 (four) times daily.     metoprolol succinate (TOPROL-XL) 100 MG 24 hr tablet 100 mg daily.     montelukast (SINGULAIR) 10 MG tablet Take 10 mg by mouth daily.     Multiple Vitamin (MULTIVITAMIN WITH MINERALS) TABS tablet Take 1 tablet by mouth daily after breakfast.      mupirocin ointment (BACTROBAN) 2 % APPLY EXTERNALLY  TO THE AFFECTED AREA TWICE DAILY FOR 3 WEEKS, PLACE COTTON BETWEEN TOES TO ALLOW BETTER AERATION AS NEEDED 66 g 1   omeprazole (PRILOSEC) 40 MG capsule Take 1 capsule (40 mg total) by mouth 2 (two) times daily. 180 capsule 2   OVER THE COUNTER MEDICATION Take 2 capsules by mouth daily. Zhou: thyroid supplement     tizanidine (ZANAFLEX) 6 MG capsule Take 1 capsule (6 mg total) by mouth 3 (three) times daily. 90 capsule 11   torsemide (DEMADEX) 20 MG tablet Take 10 mg by mouth See admin instructions. Tues Thurs and Sat     Ubrogepant (UBRELVY) 50 MG TABS Take 1  tablet at the onset of migraine can repeat in 2 hours if needed. 15 tablet 3   [DISCONTINUED] zolpidem (AMBIEN) 5 MG tablet Take 1-2 tablets (5-10 mg total) by mouth at bedtime as needed for sleep. (Patient not taking: Reported on 04/23/2019) 30 tablet 2   No current facility-administered medications on file prior to visit.    Cardiovascular and other pertinent studies:  EKG 06/15/2021: Sinus rhythm 82 bpm Occasional PAC    Left atrial enlargement  Nonspecific T-abnormality  Echocardiogram 05/06/2020:  Moderately depressed LV systolic function with EF 37%. Left ventricle  cavity is normal in size. Moderate concentric hypertrophy of the left  ventricle. Hypokinetic global wall motion. Doppler evidence of grade I  (impaired) diastolic dysfunction, normal LAP. Calculated EF 37%.  Structurally normal mitral valve.  Mild (Grade I) mitral regurgitation.  Mild pulmonic regurgitation.   CCTA 04/07/2020: 1. Coronary calcium score of 0. 2. Normal coronary origin with right dominance. 3. Normal coronary arteries. RECOMMENDATIONS: 1. No evidence of CAD (0%). Consider non-atherosclerotic causes of chest pain.   Recent labs: 09/27/2020: Glucose 95, BUN/Cr 13/1.47. EGFR 55. Na/K 139/3.9. Rest of the CMP normal H/H 13/41. MCV 95. Platelets 162 HbA1C N/A% Chol 163, TG 114, HDL 45, LDL 97 (05/2020) TSH 3.2 normal  03/11/2020: Glucose 89.  BUN/creatinine 10/1.19.  GFR N/A. Na/K 139/3.9.  Rest of the CMP normal. H/H 14/43. MCV 96.  Platelets 169. Cholesterol 153, triglycerides 92, HDL 52, LDL 82.    Review of Systems  Cardiovascular:  Positive for dyspnea on exertion (Mild). Negative for chest pain, leg swelling, palpitations and syncope.        Vitals:   06/15/21 1244  BP: 128/88  Pulse: 80  Temp: (!) 96.3 F (35.7 C)  SpO2: 95%    Body mass index is 31.57 kg/m. Filed Weights   06/15/21 1244  Weight: 220 lb (99.8 kg)     Objective:   Physical Exam Vitals and nursing  note reviewed.  Constitutional:      General: He is not in acute distress. Neck:     Vascular: No JVD.  Cardiovascular:     Rate and Rhythm: Normal rate and regular rhythm.     Heart sounds: Normal heart sounds. No murmur heard. Pulmonary:     Effort: Pulmonary effort is normal.     Breath sounds: Normal breath sounds. No wheezing or rales.         Assessment & Recommendations:    60 year old African-American male with hypertension, hyperlipidemia, COPD, HIV.  Hypertension: Multiple medication intolerances.  Continue metoprolol succinate 100 mg daily.   Added hydralazine 50 mg tid.  Hypertensive cardiomyopathy: Echocardiogram in 05/2021 personally reviewed. EF around 45%. Normal coronaries (CTA 04/2020). 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 No contraindication to driving.   F/u in 2 weeks for hypertension w/me or  Luis M. Cintron, [place]   Nigel Mormon, MD Pager: [phone] Office: [phone]

## 2021-06-16 DIAGNOSIS — M542 Cervicalgia: Secondary | ICD-10-CM | POA: Diagnosis not present

## 2021-06-17 ENCOUNTER — Encounter: Payer: Self-pay | Admitting: Internal Medicine

## 2021-06-17 ENCOUNTER — Other Ambulatory Visit: Payer: PRIVATE HEALTH INSURANCE

## 2021-06-17 ENCOUNTER — Ambulatory Visit: Payer: PRIVATE HEALTH INSURANCE | Admitting: Internal Medicine

## 2021-06-17 DIAGNOSIS — R0989 Other specified symptoms and signs involving the circulatory and respiratory systems: Secondary | ICD-10-CM | POA: Diagnosis not present

## 2021-06-17 DIAGNOSIS — B349 Viral infection, unspecified: Secondary | ICD-10-CM | POA: Diagnosis not present

## 2021-06-17 DIAGNOSIS — R0981 Nasal congestion: Secondary | ICD-10-CM | POA: Diagnosis not present

## 2021-06-17 DIAGNOSIS — J441 Chronic obstructive pulmonary disease with (acute) exacerbation: Secondary | ICD-10-CM | POA: Diagnosis not present

## 2021-06-17 DIAGNOSIS — Z20822 Contact with and (suspected) exposure to covid-19: Secondary | ICD-10-CM | POA: Diagnosis not present

## 2021-06-17 MED ORDER — AZITHROMYCIN 250 MG PO TABS: ORAL_TABLET | ORAL | 0 refills | Status: DC

## 2021-06-20 ENCOUNTER — Encounter (HOSPITAL_COMMUNITY): Payer: Self-pay

## 2021-06-20 ENCOUNTER — Emergency Department (HOSPITAL_COMMUNITY): Payer: Medicare Other

## 2021-06-20 ENCOUNTER — Emergency Department (HOSPITAL_COMMUNITY)
Admission: EM | Admit: 2021-06-20 | Discharge: 2021-06-21 | Disposition: A | Discharge status: Home / Self Care | Payer: Medicare Other | Source: Home / Self Care | Attending: Emergency Medicine | Admitting: Emergency Medicine

## 2021-06-20 ENCOUNTER — Telehealth: Payer: Self-pay | Admitting: Adult Health

## 2021-06-20 ENCOUNTER — Other Ambulatory Visit: Payer: Self-pay

## 2021-06-20 ENCOUNTER — Encounter: Payer: Self-pay | Admitting: Internal Medicine

## 2021-06-20 DIAGNOSIS — I10 Essential (primary) hypertension: Secondary | ICD-10-CM | POA: Diagnosis not present

## 2021-06-20 DIAGNOSIS — D3501 Benign neoplasm of right adrenal gland: Secondary | ICD-10-CM | POA: Diagnosis not present

## 2021-06-20 DIAGNOSIS — N1831 Chronic kidney disease, stage 3a: Secondary | ICD-10-CM | POA: Diagnosis not present

## 2021-06-20 DIAGNOSIS — Z833 Family history of diabetes mellitus: Secondary | ICD-10-CM | POA: Diagnosis not present

## 2021-06-20 DIAGNOSIS — M4722 Other spondylosis with radiculopathy, cervical region: Secondary | ICD-10-CM | POA: Diagnosis not present

## 2021-06-20 DIAGNOSIS — Z87891 Personal history of nicotine dependence: Secondary | ICD-10-CM | POA: Insufficient documentation

## 2021-06-20 DIAGNOSIS — A419 Sepsis, unspecified organism: Secondary | ICD-10-CM | POA: Diagnosis not present

## 2021-06-20 DIAGNOSIS — R5381 Other malaise: Secondary | ICD-10-CM | POA: Insufficient documentation

## 2021-06-20 DIAGNOSIS — J45909 Unspecified asthma, uncomplicated: Secondary | ICD-10-CM | POA: Insufficient documentation

## 2021-06-20 DIAGNOSIS — G629 Polyneuropathy, unspecified: Secondary | ICD-10-CM | POA: Diagnosis not present

## 2021-06-20 DIAGNOSIS — Z808 Family history of malignant neoplasm of other organs or systems: Secondary | ICD-10-CM | POA: Diagnosis not present

## 2021-06-20 DIAGNOSIS — K429 Umbilical hernia without obstruction or gangrene: Secondary | ICD-10-CM | POA: Diagnosis not present

## 2021-06-20 DIAGNOSIS — G039 Meningitis, unspecified: Secondary | ICD-10-CM | POA: Diagnosis not present

## 2021-06-20 DIAGNOSIS — R1112 Projectile vomiting: Secondary | ICD-10-CM | POA: Diagnosis not present

## 2021-06-20 DIAGNOSIS — G9340 Encephalopathy, unspecified: Secondary | ICD-10-CM | POA: Diagnosis not present

## 2021-06-20 DIAGNOSIS — N390 Urinary tract infection, site not specified: Secondary | ICD-10-CM | POA: Diagnosis not present

## 2021-06-20 DIAGNOSIS — R519 Headache, unspecified: Secondary | ICD-10-CM | POA: Insufficient documentation

## 2021-06-20 DIAGNOSIS — G252 Other specified forms of tremor: Secondary | ICD-10-CM | POA: Diagnosis not present

## 2021-06-20 DIAGNOSIS — R5383 Other fatigue: Secondary | ICD-10-CM | POA: Insufficient documentation

## 2021-06-20 DIAGNOSIS — Z7951 Long term (current) use of inhaled steroids: Secondary | ICD-10-CM | POA: Diagnosis not present

## 2021-06-20 DIAGNOSIS — Z79899 Other long term (current) drug therapy: Secondary | ICD-10-CM | POA: Insufficient documentation

## 2021-06-20 DIAGNOSIS — J449 Chronic obstructive pulmonary disease, unspecified: Secondary | ICD-10-CM | POA: Diagnosis not present

## 2021-06-20 DIAGNOSIS — Z8546 Personal history of malignant neoplasm of prostate: Secondary | ICD-10-CM | POA: Insufficient documentation

## 2021-06-20 DIAGNOSIS — N182 Chronic kidney disease, stage 2 (mild): Secondary | ICD-10-CM | POA: Diagnosis not present

## 2021-06-20 DIAGNOSIS — N281 Cyst of kidney, acquired: Secondary | ICD-10-CM | POA: Diagnosis not present

## 2021-06-20 DIAGNOSIS — M791 Myalgia, unspecified site: Secondary | ICD-10-CM | POA: Diagnosis not present

## 2021-06-20 DIAGNOSIS — Z96651 Presence of right artificial knee joint: Secondary | ICD-10-CM | POA: Insufficient documentation

## 2021-06-20 DIAGNOSIS — K5730 Diverticulosis of large intestine without perforation or abscess without bleeding: Secondary | ICD-10-CM | POA: Diagnosis not present

## 2021-06-20 DIAGNOSIS — M542 Cervicalgia: Secondary | ICD-10-CM | POA: Diagnosis not present

## 2021-06-20 DIAGNOSIS — R109 Unspecified abdominal pain: Secondary | ICD-10-CM | POA: Insufficient documentation

## 2021-06-20 DIAGNOSIS — M7918 Myalgia, other site: Secondary | ICD-10-CM | POA: Insufficient documentation

## 2021-06-20 DIAGNOSIS — Z981 Arthrodesis status: Secondary | ICD-10-CM | POA: Diagnosis not present

## 2021-06-20 DIAGNOSIS — Z8249 Family history of ischemic heart disease and other diseases of the circulatory system: Secondary | ICD-10-CM | POA: Diagnosis not present

## 2021-06-20 DIAGNOSIS — K5900 Constipation, unspecified: Secondary | ICD-10-CM | POA: Diagnosis not present

## 2021-06-20 DIAGNOSIS — Z21 Asymptomatic human immunodeficiency virus [HIV] infection status: Secondary | ICD-10-CM | POA: Insufficient documentation

## 2021-06-20 DIAGNOSIS — E782 Mixed hyperlipidemia: Secondary | ICD-10-CM | POA: Diagnosis not present

## 2021-06-20 DIAGNOSIS — Z9189 Other specified personal risk factors, not elsewhere classified: Secondary | ICD-10-CM | POA: Diagnosis not present

## 2021-06-20 DIAGNOSIS — K439 Ventral hernia without obstruction or gangrene: Secondary | ICD-10-CM | POA: Diagnosis not present

## 2021-06-20 DIAGNOSIS — R0681 Apnea, not elsewhere classified: Secondary | ICD-10-CM | POA: Diagnosis not present

## 2021-06-20 DIAGNOSIS — A879 Viral meningitis, unspecified: Secondary | ICD-10-CM | POA: Diagnosis not present

## 2021-06-20 DIAGNOSIS — Z0389 Encounter for observation for other suspected diseases and conditions ruled out: Secondary | ICD-10-CM | POA: Diagnosis not present

## 2021-06-20 DIAGNOSIS — Z20822 Contact with and (suspected) exposure to covid-19: Secondary | ICD-10-CM | POA: Insufficient documentation

## 2021-06-20 DIAGNOSIS — R509 Fever, unspecified: Secondary | ICD-10-CM | POA: Diagnosis not present

## 2021-06-20 DIAGNOSIS — I129 Hypertensive chronic kidney disease with stage 1 through stage 4 chronic kidney disease, or unspecified chronic kidney disease: Secondary | ICD-10-CM | POA: Diagnosis not present

## 2021-06-20 DIAGNOSIS — B9735 Human immunodeficiency virus, type 2 [HIV 2] as the cause of diseases classified elsewhere: Secondary | ICD-10-CM | POA: Diagnosis not present

## 2021-06-20 LAB — COMPREHENSIVE METABOLIC PANEL
ALT: 20 U/L (ref 0–44)
AST: 23 U/L (ref 15–41)
Albumin: 4.4 g/dL (ref 3.5–5.0)
Alkaline Phosphatase: 89 U/L (ref 38–126)
Anion gap: 7 (ref 5–15)
BUN: 15 mg/dL (ref 6–20)
CO2: 24 mmol/L (ref 22–32)
Calcium: 9 mg/dL (ref 8.9–10.3)
Chloride: 102 mmol/L (ref 98–111)
Creatinine, Ser: 1.41 mg/dL — ABNORMAL HIGH (ref 0.61–1.24)
GFR, Estimated: 58 mL/min — ABNORMAL LOW (ref >60)
Glucose, Bld: 99 mg/dL (ref 70–99)
Potassium: 3.6 mmol/L (ref 3.5–5.1)
Sodium: 133 mmol/L — ABNORMAL LOW (ref 135–145)
Total Bilirubin: 1.6 mg/dL — ABNORMAL HIGH (ref 0.3–1.2)
Total Protein: 8.1 g/dL (ref 6.5–8.1)

## 2021-06-20 LAB — URINALYSIS, ROUTINE W REFLEX MICROSCOPIC
Bilirubin Urine: NEGATIVE
Glucose, UA: NEGATIVE mg/dL
Ketones, ur: 20 mg/dL — AB
Leukocytes,Ua: NEGATIVE
Nitrite: NEGATIVE
Protein, ur: 100 mg/dL — AB
Specific Gravity, Urine: 1.014 (ref 1.005–1.030)
pH: 5 (ref 5.0–8.0)

## 2021-06-20 LAB — RESP PANEL BY RT-PCR (FLU A&B, COVID) ARPGX2
Influenza A by PCR: NEGATIVE
Influenza B by PCR: NEGATIVE
SARS Coronavirus 2 by RT PCR: NEGATIVE

## 2021-06-20 LAB — CBC WITH DIFFERENTIAL/PLATELET
Abs Immature Granulocytes: 0.01 10*3/uL (ref 0.00–0.07)
Basophils Absolute: 0 10*3/uL (ref 0.0–0.1)
Basophils Relative: 1 %
Eosinophils Absolute: 0.1 10*3/uL (ref 0.0–0.5)
Eosinophils Relative: 1 %
HCT: 45.9 % (ref 39.0–52.0)
Hemoglobin: 15.4 g/dL (ref 13.0–17.0)
Immature Granulocytes: 0 %
Lymphocytes Relative: 23 %
Lymphs Abs: 1.5 10*3/uL (ref 0.7–4.0)
MCH: 31 pg (ref 26.0–34.0)
MCHC: 33.6 g/dL (ref 30.0–36.0)
MCV: 92.5 fL (ref 80.0–100.0)
Monocytes Absolute: 0.6 10*3/uL (ref 0.1–1.0)
Monocytes Relative: 9 %
Neutro Abs: 4.3 10*3/uL (ref 1.7–7.7)
Neutrophils Relative %: 66 %
Platelets: 181 10*3/uL (ref 150–400)
RBC: 4.96 MIL/uL (ref 4.22–5.81)
RDW: 13.7 % (ref 11.5–15.5)
WBC: 6.5 10*3/uL (ref 4.0–10.5)
nRBC: 0 % (ref 0.0–0.2)

## 2021-06-20 LAB — APTT: aPTT: 28 seconds (ref 24–36)

## 2021-06-20 LAB — LACTIC ACID, PLASMA: Lactic Acid, Venous: 1 mmol/L (ref 0.5–1.9)

## 2021-06-20 LAB — PROTIME-INR
INR: 1.1 (ref 0.8–1.2)
Prothrombin Time: 13.9 seconds (ref 11.4–15.2)

## 2021-06-20 MED ORDER — HYDROMORPHONE HCL 1 MG/ML IJ SOLN
1.0000 mg | Freq: Once | INTRAMUSCULAR | Status: AC
  Administered 2021-06-20: 23:00:00 1 mg via INTRAVENOUS
  Filled 2021-06-20: qty 1

## 2021-06-20 MED ORDER — LACTATED RINGERS IV BOLUS
1000.0000 mL | Freq: Once | INTRAVENOUS | Status: AC
  Administered 2021-06-20: 23:00:00 1000 mL via INTRAVENOUS

## 2021-06-20 MED ORDER — LACTATED RINGERS IV SOLN: INTRAVENOUS | Status: DC

## 2021-06-20 MED ORDER — METOCLOPRAMIDE HCL 5 MG/ML IJ SOLN
10.0000 mg | Freq: Once | INTRAMUSCULAR | Status: AC
  Administered 2021-06-20: 21:00:00 10 mg via INTRAVENOUS
  Filled 2021-06-20: qty 2

## 2021-06-20 MED ORDER — DIPHENHYDRAMINE HCL 50 MG/ML IJ SOLN
25.0000 mg | Freq: Once | INTRAMUSCULAR | Status: AC
  Administered 2021-06-20: 21:00:00 25 mg via INTRAVENOUS
  Filled 2021-06-20: qty 1

## 2021-06-20 MED ORDER — IOHEXOL 350 MG/ML SOLN
80.0000 mL | Freq: Once | INTRAVENOUS | Status: AC | PRN
  Administered 2021-06-20: 21:00:00 80 mL via INTRAVENOUS

## 2021-06-20 NOTE — Telephone Encounter (Signed)
Unable to reach patient to confirm DOS he is referring to. Patient was last seen 05/13/21

## 2021-06-20 NOTE — Telephone Encounter (Signed)
I called pts sister, April.  Pt has been seen at urgent care last Thursday.  Diagnosed by CT pneumonia. (Body aches, fever, cough) placed on steroid dosepack, zithromax, levaquin. Now c/o of L head pain, throbbing.  She states that she gave him gabapentin (uses prn only),  caused him SOB?, and dry mouth.  He has taken this for years.  I relayed ? Coincidence since he had taken this previously.  Roselyn Meier helps some.  ? Needing something else.  I relayed that since ill, ? Flu, other viral?  Can cause headaches.  Hydrate well, (rest).  May take time, of course if  symptoms worsen seek immediate care.  Will ask if other recommendation for migraine acute txt.  Triptans made him feel sick, no preventative.

## 2021-06-20 NOTE — ED Notes (Signed)
EKG given to Dr. Wentz 

## 2021-06-20 NOTE — ED Provider Notes (Signed)
[facility] DEPT Provider Note   CSN: [id number] Arrival date & time: 06/20/21  1732     History Chief Complaint  Patient presents with   Dizziness   UTI   Headache   Dysuria    Casey Brennan is a 60 y.o. male.  HPI Patient has been ill for a week.  Symptoms include fever body aches and headache.  T-max at home has been up to 101.  Patient has been vomiting intermittently on most days.  Pain is a headache particularly posterior and with some neck pain.  Patient does have history of anterior cervical discectomy and fusion spring this year.  He did well after the first few months but has been having more persistent neck pain now.  Patient also is having flank pain.  Reports is bilateral.  Sharp and aching in quality.  Patient's wife reports that they have been speaking with neurosurgery and there were plans to get a CT scan of the head and C-spine.  However, this week symptoms had worsened with fever and significant malaise and myalgia.  Patient was treated this week with a course of Levaquin with no improvement.  Patient is HIV positive and has been well controlled for many years with normal CD4 count and undetectable viral load.  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    Past Medical History:  Diagnosis Date   Arthritis    r knee    Asthma    very rare   Axillary lymphadenopathy    Bronchitis    Chronic anemia    normocytic   Chronic folliculitis    Elevated PSA    Environmental and seasonal allergies    GERD (gastroesophageal reflux disease)    Patient denies   Gross hematuria    H/O pericarditis    2010--  myopercarditis--  resolved   HCAP (healthcare-associated pneumonia) 11/19/2014   Headache(784.0)    HX SEVERE FRONTAL HA'S   Hiatal hernia    History of concussion    2012  &  2013  RESIDUAL HA'S --  RESOLVED   History of gastric ulcer    History of kidney stones    History of MRSA infection 2010   infected  boil   HIV (human immunodeficiency virus infection) ([place]) 1988   Hypertension    Internal hemorrhoids    Lytic bone lesion of hip    WORK-UP DONE BY ONCOLOGIST DR HA --  NOT MALIGNANT   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   Pneumonia    hx of   Post concussion syndrome    resolved   Prostate cancer ([place]) 04/25/13   gleason 3+3=6, volume 30 gm   Sinusitis, chronic 05/14/2015   Ulcer    hx of gastric   Wears glasses     Patient Active Problem List   Diagnosis Date Noted   Wheezing 04/09/2021   AKI (acute kidney injury) ([place]) 12/29/2020   Allergic rhinitis due to pollen 12/27/2020   Allergy to dairy product 12/27/2020   Angioneurotic edema 12/27/2020   Mild persistent asthma, uncomplicated 04/29/2535   Allergic rhinitis 12/27/2020   Vitamin D deficiency 12/27/2020   S/P cervical spinal fusion 11/11/2020   Nevus 11/11/2020   Myelopathy ([place]) 09/29/2020   Chronic obstructive pulmonary disease with acute exacerbation ([place]) 07/13/2020   Encounter for well adult exam with abnormal findings 06/23/2020   Elevated TSH 05/20/2020   Mixed hyperlipidemia 05/12/2020   Generalized abdominal pain 07/07/2019  Peripheral neuropathy 06/02/2019   Healthcare maintenance 01/08/2019   Myopia of both eyes with astigmatism and presbyopia 09/13/2018   Tension headache 11/12/2017   Hepatitis B immune 06/06/2017   Chronic pain of left wrist 06/06/2017   Constipation 09/27/2016   Chronic renal insufficiency, stage 2 (mild) 05/22/2016   Skin lesion of right lower extremity 02/22/2016   Migraines 02/22/2016   Low back pain 02/22/2016   Osteoarthritis of right knee 01/11/2016   Kidney stones 10/25/2015   Sinusitis, chronic 05/14/2015   Cough 01/18/2015   GERD (gastroesophageal reflux disease) 07/29/2014   COPD GOLD III 07/22/2014   Hx of primary hypertension 04/27/2014   H/O prostate cancer    Lytic bone lesion of hip 08/22/2012   Cataract, nuclear 05/15/2012   Nuclear sclerotic cataract  of both eyes 05/15/2012   Asthma 01/23/2012   Insomnia 05/11/2011   Primary hypertension 04/17/2011   Mood disorder in conditions classified elsewhere 06/14/2009   Human immunodeficiency virus (HIV) disease ([place]) 04/19/2009    Past Surgical History:  Procedure Laterality Date   ACDCF  09/29/2020   ANTERIOR CERVICAL DECOMPRESSION/DISCECTOMY FUSION 4 LEVELS N/A 09/29/2020   Procedure: ANTERIOR CERVICAL DECOMPRESSION FUSION CERVICAL 3-CERVICAL 4 , CERVICAL 4 - CERVICAL 5, CERVICAL 5 - CERVICAL 6 WITH INSTRUMENTATION AND ALLOGRAFT;  Surgeon: Phylliss Bob, MD;  Location: [facility];  Service: Orthopedics;  Laterality: N/A;   CARDIAC CATHETERIZATION  03-23-2009  DR COOPER   NORMAL CORONARY ARTERIES   CHOLECYSTECTOMY N/A 11/06/2014   Procedure: LAPAROSCOPIC CHOLECYSTECTOMY WITH INTRAOPERATIVE CHOLANGIOGRAM;  Surgeon: Judeth Horn, MD;  Location: [facility];  Service: General;  Laterality: N/A;   COLONOSCOPY  12/26/2011   Procedure: COLONOSCOPY;  Surgeon: Lear Ng, MD;  Location: [facility];  Service: Endoscopy;  Laterality: N/A;   COLONOSCOPY     COLONOSCOPY WITH PROPOFOL N/A 07/29/2014   Procedure: COLONOSCOPY WITH PROPOFOL;  Surgeon: Lear Ng, MD;  Location: [facility];  Service: Endoscopy;  Laterality: N/A;   CYSTOSCOPY N/A 05/18/2018   Procedure: CYSTOSCOPY REMOVAL FOREIGN BODY;  Surgeon: Kathie Rhodes, MD;  Location: [facility];  Service: Urology;  Laterality: N/A;   CYSTOSCOPY/RETROGRADE/URETEROSCOPY Bilateral 04/25/2013   Procedure: CYSTOSCOPY/ BILATERAL RETROGRADES; BLADDER BIOPSIES;  Surgeon: Alexis Frock, MD;  Location: [facility];  Service: Urology;  Laterality: Bilateral;   DENTAL EXAMINATION UNDER ANESTHESIA     ESOPHAGOGASTRODUODENOSCOPY  12/26/2011   Procedure: ESOPHAGOGASTRODUODENOSCOPY (EGD);  Surgeon: Lear Ng, MD;  Location: [facility];  Service: Endoscopy;  Laterality: N/A;   ESOPHAGOGASTRODUODENOSCOPY (EGD) WITH PROPOFOL N/A 07/29/2014    Procedure: ESOPHAGOGASTRODUODENOSCOPY (EGD) WITH PROPOFOL;  Surgeon: Lear Ng, MD;  Location: [facility];  Service: Endoscopy;  Laterality: N/A;   EXCISION CHRONIC LEFT BREAST ABSCESS  09-21-2010   EXCISIONAL BX LEFT BREAST MASS/  I  &  D LEFT BREAST ABSCESS  03-24-2009   KNEE ARTHROSCOPY Right 1985   left axilla biopsy  04/2013   left hip biopsy  10/2012   LYMPHADENECTOMY Bilateral 08/18/2013   Procedure: LYMPHADENECTOMY "PELVIC LYMPH NODE DISSECTION";  Surgeon: Alexis Frock, MD;  Location: [facility];  Service: Urology;  Laterality: Bilateral;   PROSTATE BIOPSY N/A 04/25/2013   Procedure: BIOPSY TRANSRECTAL ULTRASONIC PROSTATE (TUBP);  Surgeon: Alexis Frock, MD;  Location[facility];  Service: Urology;  Laterality: N/A;   ROBOT ASSISTED LAPAROSCOPIC RADICAL PROSTATECTOMY N/A 08/18/2013   Procedure: ROBOTIC ASSISTED LAPAROSCOPIC RADICAL PROSTATECTOMY;  Surgeon: Alexis Frock, MD;  Location: [facility];  Service: Urology;  Laterality: N/A;  TOTAL KNEE ARTHROPLASTY Right 08/01/2016   08/17/16   TOTAL KNEE ARTHROPLASTY Right 08/01/2016   Procedure: TOTAL KNEE ARTHROPLASTY;  Surgeon: Renette Butters, MD;  Location: [facility];  Service: Orthopedics;  Laterality: Right;   UPPER GASTROINTESTINAL ENDOSCOPY     UPPER LEG SOFT TISSUE BIOPSY Left 2012   lthigh       Family History  Problem Relation Age of Onset   Stroke Father    Diabetes Father    Cancer Father        brain cancer   Asthma Father    Hypertension Sister    Cancer Maternal Uncle        prostate cancer   Asthma Sister    Colon cancer Neg Hx    Colon polyps Neg Hx    Esophageal cancer Neg Hx    Stomach cancer Neg Hx    Rectal cancer Neg Hx     Social History   Tobacco Use   Smoking status: Former    Packs/day: 1.50    Years: 23.00    Pack years: 34.50    Types: Cigars, Cigarettes    Quit date: 05/22/2010    Years since quitting: 11.0   Smokeless tobacco: Never  Vaping Use   Vaping Use:  Never used  Substance Use Topics   Alcohol use: Not Currently    Alcohol/week: 0.0 standard drinks    Comment: 1-2 drinks a week    Drug use: No    Home Medications Prior to Admission medications   Medication Sig Start Date End Date Taking? Authorizing Provider  albuterol (PROVENTIL) (2.5 MG/3ML) 0.083% nebulizer solution Take 3 mLs (2.5 mg total) by nebulization every 6 (six) hours as needed for wheezing or shortness of breath. 01/28/21  Yes Campbell Riches, MD  albuterol (VENTOLIN HFA) 108 (90 Base) MCG/ACT inhaler Inhale 2 puffs into the lungs every 6 (six) hours as needed for wheezing or shortness of breath. 01/28/21  Yes Campbell Riches, MD  ALPRAZolam (XANAX) 0.5 MG tablet TAKE 1 TABLET(0.5 MG) BY MOUTH TWICE DAILY AS NEEDED FOR SLEEP 03/21/21  Yes Ward Givens, NP  Beclomethasone Dipropionate (QNASL) 80 MCG/ACT AERS Place 2 sprays into the nose daily. 01/04/21  Yes Rigoberto Noel, MD  bictegravir-emtricitabine-tenofovir AF (BIKTARVY) 50-200-25 MG TABS tablet Take 1 tablet by mouth daily. 11/11/20  Yes Kuppelweiser, Cassie L, RPH-CPP  Budeson-Glycopyrrol-Formoterol (BREZTRI AEROSPHERE) 160-9-4.8 MCG/ACT AERO Inhale 2 puffs into the lungs in the morning and at bedtime. 01/04/21  Yes Rigoberto Noel, MD  cetirizine (ZYRTEC) 10 MG tablet Take 1 tablet (10 mg total) by mouth daily. 10/18/20 10/18/21 Yes Biagio Borg, MD  chlorpheniramine-HYDROcodone (TUSSIONEX) 10-8 MG/5ML SUER Take 5 mLs by mouth every 12 (twelve) hours as needed for congestion. 06/17/21 06/24/21 Yes [provider]  Cholecalciferol ([employer] VITAMIN D3) 250 MCG (10000 UT) CAPS Take by mouth.   Yes [provider]  cyanocobalamin (,VITAMIN B-12,) 1000 MCG/ML injection Inject 1 mL (1,000 mcg total) into the muscle every 30 (thirty) days. 11/12/20  Yes Biagio Borg, MD  diphenhydrAMINE (BENADRYL) 25 MG tablet Take 1 tablet (25 mg total) by mouth every 6 (six) hours as needed. 04/11/21  Yes Venter, Margaux, PA-C   EPINEPHrine 0.3 mg/0.3 mL IJ SOAJ injection Inject 0.3 mg into the muscle as needed for anaphylaxis. 10/18/20  Yes Biagio Borg, MD  EPINEPHrine 0.3 mg/0.3 mL IJ SOAJ injection Inject 0.3 mg into the muscle as needed for anaphylaxis. 04/11/21  Yes Alroy Bailiff,  Margaux, PA-C  Glucos-Chond-Hyal Ac-Ca Fructo (MOVE FREE JOINT HEALTH ADVANCE PO) Take 1 tablet by mouth daily.   Yes [provider]  hydrALAZINE (APRESOLINE) 50 MG tablet Take 1 tablet (50 mg total) by mouth 3 (three) times daily. 06/15/21 09/13/21 Yes Patwardhan, Reynold Bowen, MD  HYDROcodone-acetaminophen (NORCO/VICODIN) 5-325 MG tablet Take 1-2 tablets by mouth every 6 (six) hours as needed for moderate pain or severe pain. 06/21/21  Yes Charlesetta Shanks, MD  hydrOXYzine (ATARAX/VISTARIL) 25 MG tablet Take 1 tablet (25 mg total) by mouth at bedtime. 05/24/20  Yes Dohmeier, Asencion Partridge, MD  levofloxacin (LEVAQUIN) 750 MG tablet Take 750 mg by mouth daily. 06/17/21 06/24/21 Yes [provider]  methocarbamol (ROBAXIN) 500 MG tablet Take 500 mg by mouth 4 (four) times daily.   Yes [provider]  methylPREDNISolone (MEDROL DOSEPAK) 4 MG TBPK tablet See admin instructions. follow package directions 06/17/21  Yes [provider]  metoprolol succinate (TOPROL-XL) 100 MG 24 hr tablet 100 mg daily. 12/13/20  Yes [provider]  montelukast (SINGULAIR) 10 MG tablet Take 10 mg by mouth daily. 11/15/20  Yes [provider]  Multiple Vitamin (MULTIVITAMIN WITH MINERALS) TABS tablet Take 1 tablet by mouth daily after breakfast.    Yes [provider]  mupirocin ointment (BACTROBAN) 2 % APPLY EXTERNALLY TO THE AFFECTED AREA TWICE DAILY FOR 3 WEEKS, PLACE COTTON BETWEEN TOES TO ALLOW BETTER AERATION AS NEEDED 04/20/21  Yes Campbell Riches, MD  naproxen sodium (ALEVE) 220 MG tablet Take 220 mg by mouth daily.   Yes [provider]  omeprazole (PRILOSEC) 40 MG capsule Take 1 capsule (40 mg total)  by mouth 2 (two) times daily. 04/20/21  Yes Biagio Borg, MD  ondansetron (ZOFRAN-ODT) 4 MG disintegrating tablet Take 1 tablet (4 mg total) by mouth every 4 (four) hours as needed for nausea or vomiting. 06/21/21  Yes Charlesetta Shanks, MD  OVER THE COUNTER MEDICATION Take 2 capsules by mouth daily. Zhou: thyroid supplement   Yes [provider]  tizanidine (ZANAFLEX) 6 MG capsule Take 1 capsule (6 mg total) by mouth 3 (three) times daily. 06/15/20  Yes Dohmeier, Asencion Partridge, MD  torsemide (DEMADEX) 20 MG tablet Take 10 mg by mouth See admin instructions. Tuesday Thursday Saturday   Yes [provider]  Ubrogepant (UBRELVY) 50 MG TABS Take 1 tablet at the onset of migraine can repeat in 2 hours if needed. 03/17/21  Yes Ward Givens, NP  azithromycin (ZITHROMAX) 250 MG tablet Take 2 tablets on day 1, then 1 tablet daily on days 2 through 5 Patient not taking: Reported on 06/20/2021 06/17/21 06/22/21  Biagio Borg, MD  gabapentin (NEURONTIN) 300 MG capsule TAKE 1 CAPSULE(300 MG) BY MOUTH FOUR TIMES DAILY AS NEEDED FOR PAIN 05/31/21   Dohmeier, Asencion Partridge, MD  zolpidem (AMBIEN) 5 MG tablet Take 1-2 tablets (5-10 mg total) by mouth at bedtime as needed for sleep. Patient not taking: Reported on 04/23/2019 12/21/16 05/19/19  Mignon Pine, DO    Allergies    Amoxapine, Chocolate, Descovy [emtricitabine-tenofovir af], Genvoya [elviteg-cobic-emtricit-tenofaf], Morphine and related, Oxycodone-acetaminophen, Penicillins, Sulfa antibiotics, Tomato, Vancomycin, Darunavir, Carvedilol, Flagyl [metronidazole], Milk-related compounds, Morphine, Penicillin g, Toradol [ketorolac tromethamine], Acetaminophen, and Tramadol  Review of Systems   Review of Systems 10 systems reviewed and negative except as per HPI Physical Exam Updated Vital Signs BP (!) 131/103    Pulse 96    Temp 100.2 F (37.9 C) (Oral)    Resp 10  Ht 5\' 10"  (1.778 m)    Wt 95.3 kg    SpO2 94%    BMI 30.13 kg/m   Physical  Exam Constitutional:      Appearance: Normal appearance. He is well-developed.     Comments: Patient is nontoxic but appears mildly uncomfortable.  Mental status is clear.  No respiratory distress.  HENT:     Head: Normocephalic and atraumatic.     Right Ear: Tympanic membrane normal.     Left Ear: Tympanic membrane normal.     Mouth/Throat:     Pharynx: Oropharynx is clear.     Comments: Scant whitish plaque on the tongue. Eyes:     Extraocular Movements: Extraocular movements intact.     Pupils: Pupils are equal, round, and reactive to light.  Cardiovascular:     Rate and Rhythm: Normal rate and regular rhythm.  Pulmonary:     Effort: Pulmonary effort is normal.     Breath sounds: Normal breath sounds.  Abdominal:     General: There is no distension.     Palpations: Abdomen is soft.     Tenderness: There is no abdominal tenderness. There is no guarding.     Comments: Positive bilateral CVA tenderness.  Musculoskeletal:        General: No swelling or tenderness. Normal range of motion.     Cervical back: Neck supple.  Skin:    General: Skin is warm and dry.  Neurological:     General: No focal deficit present.     Mental Status: He is alert and oriented to person, place, and time.     Motor: No weakness.     Coordination: Coordination normal.  Psychiatric:        Mood and Affect: Mood normal.    ED Results / Procedures / Treatments   Labs (all labs ordered are listed, but only abnormal results are displayed) Labs Reviewed  COMPREHENSIVE METABOLIC PANEL - Abnormal; Notable for the following components:      Result Value   Sodium 133 (*)    Creatinine, Ser 1.41 (*)    Total Bilirubin 1.6 (*)    GFR, Estimated 58 (*)    All other components within normal limits  URINALYSIS, ROUTINE W REFLEX MICROSCOPIC - Abnormal; Notable for the following components:   Hgb urine dipstick SMALL (*)    Ketones, ur 20 (*)    Protein, ur 100 (*)    Bacteria, UA RARE (*)    All other  components within normal limits  RESP PANEL BY RT-PCR (FLU A&B, COVID) ARPGX2  CULTURE, BLOOD (ROUTINE X 2)  CULTURE, BLOOD (ROUTINE X 2)  URINE CULTURE  LACTIC ACID, PLASMA  CBC WITH DIFFERENTIAL/PLATELET  PROTIME-INR  APTT  LACTIC ACID, PLASMA    EKG None  Radiology CT Head Wo Contrast  Result Date: 06/20/2021 CLINICAL DATA:  Headaches and neck pain, initial encounter EXAM: CT HEAD WITHOUT CONTRAST CT CERVICAL SPINE WITHOUT CONTRAST TECHNIQUE: Multidetector CT imaging of the head and cervical spine was performed following the standard protocol without intravenous contrast. Multiplanar CT image reconstructions of the cervical spine were also generated. COMPARISON:  None. FINDINGS: CT HEAD FINDINGS Brain: No evidence of acute infarction, hemorrhage, hydrocephalus, extra-axial collection or mass lesion/mass effect. Vascular: No hyperdense vessel or unexpected calcification. Skull: Normal. Negative for fracture or focal lesion. Sinuses/Orbits: No acute finding. Other: None. CT CERVICAL SPINE FINDINGS Alignment: Within normal limits. Skull base and vertebrae: Surgical changes are noted from C3-C6 with interbody fusion and  anterior fixation. Multilevel osteophytic changes are seen. Facet hypertrophic changes are noted. No acute fracture or acute facet abnormality is noted. The odontoid is within normal limits. Soft tissues and spinal canal: Surrounding soft tissue structures are within normal limits. Upper chest: Visualized lung apices are within normal limits. Other: None IMPRESSION: CT of the head: No acute intracranial abnormality noted. CT of the cervical spine: Postsurgical changes from C3-C6. No acute abnormality noted. Electronically Signed   By: Inez Catalina M.D.   On: 06/20/2021 21:32   CT Cervical Spine Wo Contrast  Result Date: 06/20/2021 CLINICAL DATA:  Headaches and neck pain, initial encounter EXAM: CT HEAD WITHOUT CONTRAST CT CERVICAL SPINE WITHOUT CONTRAST TECHNIQUE: Multidetector  CT imaging of the head and cervical spine was performed following the standard protocol without intravenous contrast. Multiplanar CT image reconstructions of the cervical spine were also generated. COMPARISON:  None. FINDINGS: CT HEAD FINDINGS Brain: No evidence of acute infarction, hemorrhage, hydrocephalus, extra-axial collection or mass lesion/mass effect. Vascular: No hyperdense vessel or unexpected calcification. Skull: Normal. Negative for fracture or focal lesion. Sinuses/Orbits: No acute finding. Other: None. CT CERVICAL SPINE FINDINGS Alignment: Within normal limits. Skull base and vertebrae: Surgical changes are noted from C3-C6 with interbody fusion and anterior fixation. Multilevel osteophytic changes are seen. Facet hypertrophic changes are noted. No acute fracture or acute facet abnormality is noted. The odontoid is within normal limits. Soft tissues and spinal canal: Surrounding soft tissue structures are within normal limits. Upper chest: Visualized lung apices are within normal limits. Other: None IMPRESSION: CT of the head: No acute intracranial abnormality noted. CT of the cervical spine: Postsurgical changes from C3-C6. No acute abnormality noted. Electronically Signed   By: Inez Catalina M.D.   On: 06/20/2021 21:32   CT Abdomen Pelvis W Contrast  Result Date: 06/20/2021 CLINICAL DATA:  Flank pain.  Concern for kidney stone. EXAM: CT ABDOMEN AND PELVIS WITH CONTRAST TECHNIQUE: Multidetector CT imaging of the abdomen and pelvis was performed using the standard protocol following bolus administration of intravenous contrast. CONTRAST:  52mL OMNIPAQUE IOHEXOL 350 MG/ML SOLN COMPARISON:  CT of the abdomen pelvis dated 02/07/2021. FINDINGS: Lower chest: The visualized lung bases are clear. No intra-abdominal free air or free fluid. Hepatobiliary: The liver is unremarkable. No intrahepatic biliary dilatation. Cholecystectomy. Pancreas: Unremarkable. No pancreatic ductal dilatation or surrounding  inflammatory changes. Spleen: Normal in size without focal abnormality. Adrenals/Urinary Tract: There is a 2 cm indeterminate right adrenal nodule, previously characterized as adenoma. The left adrenal glands unremarkable. There is no hydronephrosis on either side. There is symmetric enhancement and excretion of contrast by both kidneys. Bilateral renal cysts measure up to 4 cm in the upper pole of the right kidney. Several additional subcentimeter hypodense lesions are too small to characterize. There is no hydronephrosis on either side. There is symmetric enhancement and excretion of contrast by both kidneys. The visualized ureters and urinary bladder appear unremarkable. Stomach/Bowel: Mild thickened appearance of the rectum neck be related to underdistention. Clinical correlation is recommended to evaluate for possibility of mild proctitis. There is sigmoid diverticulosis without active inflammatory changes. There is no bowel obstruction or active inflammation. The appendix is normal. Vascular/Lymphatic: The abdominal aorta and IVC are unremarkable. No portal venous gas. There is no adenopathy. Reproductive: Prostatectomy. Other: Small fat containing umbilical and several additional small fat containing midline ventral hernia. No bowel herniation. Musculoskeletal: No acute or significant osseous findings. IMPRESSION: 1. Underdistention of the rectum versus less likely mild proctitis. 2. Sigmoid diverticulosis.  No bowel obstruction. Normal appendix. 3. Prostatectomy. Electronically Signed   By: Anner Crete M.D.   On: 06/20/2021 21:35   DG Chest Port 1 View  Result Date: 06/20/2021 CLINICAL DATA:  Questionable sepsis. EXAM: PORTABLE CHEST 1 VIEW COMPARISON:  Chest x-ray 04/05/2021. FINDINGS: The heart size and mediastinal contours are within normal limits. Both lungs are clear. The visualized skeletal structures are unremarkable. IMPRESSION: No active disease. Electronically Signed   By: Ronney Asters  M.D.   On: 06/20/2021 19:17    Procedures Procedures   Medications Ordered in ED Medications  lactated ringers infusion ( Intravenous New Bag/Given 06/20/21 2127)  metoCLOPramide (REGLAN) injection 10 mg (10 mg Intravenous Given 06/20/21 2124)  diphenhydrAMINE (BENADRYL) injection 25 mg (25 mg Intravenous Given 06/20/21 2125)  iohexol (OMNIPAQUE) 350 MG/ML injection 80 mL (80 mLs Intravenous Contrast Given 06/20/21 2106)  HYDROmorphone (DILAUDID) injection 1 mg (1 mg Intravenous Given 06/20/21 2246)  lactated ringers bolus 1,000 mL (1,000 mLs Intravenous New Bag/Given 06/20/21 2310)    ED Course  I have reviewed the triage vital signs and the nursing notes.  Pertinent labs & imaging results that were available during my care of the patient were reviewed by me and considered in my medical decision making (see chart for details).    MDM Rules/Calculators/A&P                         Patient has had an indolent course of multiple symptoms.  Over the past week or so he has had generalized symptoms of malaise,myalgias, headaches fatigue and waxing and waning fever.  Patient does have HIV but he is well controlled with medications.  Labs within normal limits.  No lactic acidosis no leukocytosis.  CT head obtained without acute findings.  Patient is a number of months out from a anterior cervical discectomy and decompression.  Low suspicion that this represents osteomyelitis.  Blood cultures have been obtained.  Urine cultures obtained.  With indolent symptoms and negative diagnostic evaluation, my recommendation is close follow-up with neurosurgery for any concerns of possible osteomyelitis or surgical related neck pain and headache.  Other recommendations close follow-up with infectious disease for waxing and waning fever reported at home.  Patient does have low-grade temperature 100.2 in the emergency department.  At this time recommend pain control with Vicodin and Zofran for nausea.  Hydration  and rest.  Return precautions reviewed for emergency department return.   Final Clinical Impression(s) / ED Diagnoses Final diagnoses:  Malaise and fatigue  Bad headache  Myalgia    Rx / DC Orders ED Discharge Orders          Ordered    HYDROcodone-acetaminophen (NORCO/VICODIN) 5-325 MG tablet  Every 6 hours PRN        06/21/21 0002    ondansetron (ZOFRAN-ODT) 4 MG disintegrating tablet  Every 4 hours PRN        06/21/21 0002             Charlesetta Shanks, MD 06/21/21 0022

## 2021-06-20 NOTE — ED Triage Notes (Addendum)
Patient's sister reports that the patient had neck surgery in 08/2020. Patient c/o dizziness, nausea, and headache x 3-4 days.  Patient went to his kidney doctor today and was told he had a UTI. Patient has hematuria and dysuria. Temp in triage-100.2

## 2021-06-20 NOTE — Telephone Encounter (Signed)
I called sister of pt.  He was having dizziness, head pain, L side.  She had taken him to ED.  I relayed since worsening sx good that she took to ED.  She appreciated call back.

## 2021-06-20 NOTE — ED Provider Notes (Signed)
Emergency Medicine Provider Triage Evaluation Note  Casey Brennan , a 60 y.o. male  was evaluated in triage.  Pt complains of fever.  Patient has a history of HIV, normal CD4 count, hypertension, high cholesterol.  Symptoms started 7 days ago.  He was seen at urgent care on 12/16 for URI symptoms, fever, body aches and headache.  He tested negative for flu and COVID at that time.  He was prescribed Levaquin, steroids, Tussionex for COPD exacerbation and viral syndrome.  Patient continues to feel poorly.  He has had intermittent fevers.  He has developed pain in his bilateral "kidney area".  No vomiting or diarrhea.  Was seen by his kidney doctor today and was told that he had bacteria in the urine, culture was sent.  He also reports ongoing headache, worse on the left side with radiation into the neck.  He has a history of cervical fusion.  No fevers or confusion.  Review of Systems  Positive: Fever, runny nose, cough, back pain, neck pain and headache Negative: Confusion, vomiting  Physical Exam  BP 134/90 (BP Location: Left Arm)    Pulse (!) 109    Temp 100.2 F (37.9 C) (Oral)    Resp 18    Ht 5\' 10"  (1.778 m)    Wt 95.3 kg    SpO2 96%    BMI 30.13 kg/m  Gen:   Awake, no distress   Resp:  Normal effort, clear to auscultation bilaterally MSK:   Moves extremities without difficulty  Other:  HEENT exam is normal, minimal diffuse abdominal tenderness, bilateral CVA tenderness  Medical Decision Making  Medically screening exam initiated at 6:30 PM.  Appropriate orders placed.  [name] was informed that the remainder of the evaluation will be completed by another provider, this initial triage assessment does not replace that evaluation, and the importance of remaining in the ED until their evaluation is complete.     Carlisle Cater, PA-C 06/20/21 Kathaleen Maser    Charlesetta Shanks, MD 06/24/21 2104

## 2021-06-20 NOTE — Telephone Encounter (Signed)
Pt's sister has called to report pt is having a bad reaction [id number] (NEURONTIN) 300 MG capsule she states it is causing him bad headaches and sharp pain on the left side of his head, please call.

## 2021-06-21 MED ORDER — ONDANSETRON 4 MG PO TBDP: 4.0000 mg | ORAL_TABLET | ORAL | 0 refills | Status: DC | PRN

## 2021-06-21 MED ORDER — HYDROCODONE-ACETAMINOPHEN 5-325 MG PO TABS: 1.0000 | ORAL_TABLET | Freq: Four times a day (QID) | ORAL | 0 refills | Status: DC | PRN

## 2021-06-21 NOTE — Discharge Instructions (Addendum)
1.  At this time the exact cause of your symptoms is unclear.  Testing including CT of the head neck and abdomen as well as blood counts are not showing signs of severe infection.  Nonetheless, you are having persisting symptoms and I recommend you follow-up with your infectious disease specialist and neurosurgeon as soon as possible.  In the meantime, blood cultures and urine cultures are being done.. 2.  You may take Vicodin 1 to 2 tablets as prescribed for control of headache and pain.  Zofran prescribed for nausea.  Try to rest a lot and stay hydrated. 3.  Return to the emergency department if new worsening or changing symptoms.

## 2021-06-21 NOTE — Telephone Encounter (Signed)
I spoke with MM/NP about.  She thought best if Dr. Brett Fairy had opening to see pt, but if not then would see.  Made appt for 06-22-21 at 0930

## 2021-06-21 NOTE — Telephone Encounter (Signed)
Sister of pt called back.  Pt was seen in the ED, (see note).  Still having issues with migraine, ubrelvy not really helping (I reinstructed that Roselyn Meier is used acutely for migraine (max 2 in 24 hours - take onset and another 2 hours later if needed).  Was given dilaudid then hydrocodone for home which has helped.  He continues though with L head pain constant, with L sharp head pain,  L tinnitis, and some dizziness.  She was told to see neurology for migraines by orthoped surgeon (Dr. Lynann Bologna), he also has had MRI / CT done(ED).  Negative for flu/covid. She is aware of recovering from illness (dehydration can cause headaches), asking for appt to f/u migraines. You have [place] tomorrow then Thursday.  Which day would be better for you?

## 2021-06-21 NOTE — Telephone Encounter (Signed)
I called sister of pt.  Opening became available on CD/MD schedule 06-22-21 at 0930, arrive 0915 latest.  I relayed will address worsening migraine (l sided) cannot take gabapentin. Roselyn Meier not helping, triptans make him sick.  Not on preventative. Was seen in ED 06-20-21.

## 2021-06-22 ENCOUNTER — Other Ambulatory Visit: Payer: Self-pay | Admitting: *Deleted

## 2021-06-22 ENCOUNTER — Telehealth: Payer: Self-pay | Admitting: Neurology

## 2021-06-22 ENCOUNTER — Ambulatory Visit (HOSPITAL_COMMUNITY)
Admission: RE | Admit: 2021-06-22 | Discharge: 2021-06-22 | Disposition: A | Discharge status: Home / Self Care | Payer: Medicare Other | Source: Ambulatory Visit | Attending: Neurology | Admitting: Neurology

## 2021-06-22 ENCOUNTER — Other Ambulatory Visit: Payer: Self-pay

## 2021-06-22 ENCOUNTER — Encounter: Payer: Self-pay | Admitting: Neurology

## 2021-06-22 ENCOUNTER — Encounter: Payer: Self-pay | Admitting: *Deleted

## 2021-06-22 ENCOUNTER — Ambulatory Visit (INDEPENDENT_AMBULATORY_CARE_PROVIDER_SITE_OTHER): Payer: Medicare Other | Admitting: Neurology

## 2021-06-22 ENCOUNTER — Other Ambulatory Visit: Payer: PRIVATE HEALTH INSURANCE

## 2021-06-22 VITALS — BP 129/87 | HR 101 | Ht 70.0 in | Wt 210.0 lb

## 2021-06-22 DIAGNOSIS — R519 Headache, unspecified: Secondary | ICD-10-CM | POA: Diagnosis not present

## 2021-06-22 DIAGNOSIS — R0681 Apnea, not elsewhere classified: Secondary | ICD-10-CM | POA: Diagnosis not present

## 2021-06-22 DIAGNOSIS — B9735 Human immunodeficiency virus, type 2 [HIV 2] as the cause of diseases classified elsewhere: Secondary | ICD-10-CM

## 2021-06-22 DIAGNOSIS — M4722 Other spondylosis with radiculopathy, cervical region: Secondary | ICD-10-CM | POA: Diagnosis not present

## 2021-06-22 DIAGNOSIS — R509 Fever, unspecified: Secondary | ICD-10-CM | POA: Insufficient documentation

## 2021-06-22 DIAGNOSIS — G252 Other specified forms of tremor: Secondary | ICD-10-CM

## 2021-06-22 DIAGNOSIS — R1112 Projectile vomiting: Secondary | ICD-10-CM

## 2021-06-22 DIAGNOSIS — G4719 Other hypersomnia: Secondary | ICD-10-CM

## 2021-06-22 DIAGNOSIS — G9340 Encephalopathy, unspecified: Secondary | ICD-10-CM

## 2021-06-22 DIAGNOSIS — G43009 Migraine without aura, not intractable, without status migrainosus: Secondary | ICD-10-CM

## 2021-06-22 DIAGNOSIS — G4700 Insomnia, unspecified: Secondary | ICD-10-CM

## 2021-06-22 DIAGNOSIS — B20 Human immunodeficiency virus [HIV] disease: Secondary | ICD-10-CM

## 2021-06-22 DIAGNOSIS — J441 Chronic obstructive pulmonary disease with (acute) exacerbation: Secondary | ICD-10-CM

## 2021-06-22 LAB — URINE CULTURE: Culture: NO GROWTH

## 2021-06-22 MED ORDER — GADOBUTROL 1 MMOL/ML IV SOLN
10.0000 mL | Freq: Once | INTRAVENOUS | Status: AC | PRN
  Administered 2021-06-22: 20:00:00 10 mL via INTRAVENOUS

## 2021-06-22 NOTE — Telephone Encounter (Signed)
FYI  Patient MRI is scheduled at [facility] for tonight at 6:30 pm. Patient sister April is aware of time & location.  Dr. Brett Fairy they have your number so they will call you with the report.  Also, someone from [facility] will call them today to schedule his LP for tomorrow.  [employer] medicare no auth req.

## 2021-06-22 NOTE — Telephone Encounter (Signed)
Very good to have all lined up-   . I am worried about this tremor on exam in both hands, his restricted ROM at the neck, the arm pain and fever- al in a HIV positive patient.  Urine  analysis and culture was also abnormal.   Cc to PCP and his Infectious disease physicians need to know. Larey Seat, MD

## 2021-06-22 NOTE — Patient Instructions (Addendum)
Toxic Metabolic Encephalopathy Toxic metabolic encephalopathy (TME) is a type of brain disorder caused by a change in brain chemistry. This condition may result from illnesses or conditions that cause an imbalance of fluid, minerals (electrolytes), and other substances in the body. This imbalance can affect the way the brain functions. The condition is not caused by brain damage or brain disease. TME can cause confusion and other mental disturbances, which are generally referred to as delirium. Untreated delirium may lead to permanent mental changes or worsening medical conditions. Untreated delirium is a life-threatening condition that may need to be treated in the hospital. What are the causes? Possible causes of TME that can lead to delirium include: High or low levels of any of the following substances in the blood: Calcium. Salt (sodium). Sugar (glucose). Magnesium. Phosphate. Not having enough oxygen in the blood. Changes in the acid level (pH) of the blood. Certain medicines, such as steroids, sedatives, sleeping aids, or pain medicines. High body temperature or certain infections. Dehydration. Short-term (acute) or long-term (chronic) disease of the kidney or liver. Low levels of B vitamins. This can result from alcohol abuse. What increases the risk? You are more likely to develop this condition if you: Have chronic medical problems, such as diabetes, liver disease, kidney disease, heart disease, or lung disease. Had recent surgery. Are in the hospital, especially in intensive care. Are elderly, have dementia, or live in a nursing home. Are not getting enough fluids. Have poor nutrition. Abuse alcohol or have alcohol withdrawal. What are the signs or symptoms? Symptoms of TME may include: Not being able to stay awake (drowsiness) or even loss of consciousness (coma). Shaking that you cannot control (tremors) or muscle twitching. Clumsiness or weakness. Seizures. Symptoms of  delirium caused by TME include: Confusion, not knowing where you are (disorientation), poor judgment, memory loss, or poor attention and concentration. Decreased alertness. Changes in speech, such as saying things that do not make sense. Psychiatric symptoms, such as seeing or hearing things that are not real (hallucinations), false beliefs (delusions), or general mistrust of others (paranoia). Mood changes like depression, anxiety, irritability, or hyperactivity. Avoiding other people. Changes in eating and sleeping patterns. Delirium may come and go. Symptoms of delirium may start suddenly or gradually, and they often get worse at night. How is this diagnosed? This condition is diagnosed based on: Your symptoms and behavior. An exam to check how you are thinking, feeling, and behaving (mental status exam). To diagnose delirium, the mental status exam must rule out other possible causes of TME, and it must show: Changes in attention and awareness. Changes that develop over a short period of time and tend to come and go. Changes in memory, language, and thinking that were not present before. A physical exam and medical history. Imaging tests, such as an MRI or a CT scan. Blood tests to: Measure liver and kidney function. Check B vitamin levels. Check for changes in acid levels (pH) and changes in calcium, sodium, or magnesium levels in the blood. Measure your blood glucose. Measure your blood oxygen level. How is this treated? Treatment for TME depends on the cause, and it may include: Getting fluids through an IV. Regulating calcium, sodium, glucose, or magnesium levels in the body. Getting oxygen. Improving nutrition. Treating liver or kidney disease. Adjusting certain medicines. Treating infections. If the cause is found and treated, delirium usually improves. Managing delirium may include: Keeping the room well-lit and quiet. Using calendars, pictures, and clocks to prevent  disorientation. Having  frequent checks from nursing staff and visits from caregivers. Wearing eyeglasses or a hearing aid, if needed. Physical therapy. Medicine to treat agitation, anxiety, hallucinations, or delusions. Follow these instructions at home: Medicines Take over-the-counter and prescription medicines only as told by your health care provider. Do not start taking any new medicines, including over-the-counter medicines, without first checking with your health care provider. Eating and drinking  Drink enough fluid to keep your urine pale yellow. Follow a healthy diet. Do not skip meals. Do not drink alcohol. General instructions Go to bed at the same time every night. Return to your normal activities as told by your health care provider. Ask your health care provider what activities are safe for you. Keep all follow-up visits. This is important. Contact a health care provider if: You have a fever. You are unable to feed yourself or hydrate yourself. You start to feel clumsy. You start to have tremors or weakness. Get help right away if: You have a seizure. You lose consciousness. You have trouble breathing. You feel unable to care for yourself at home. You become disoriented at home. These symptoms may represent a serious problem that is an emergency. Do not wait to see if the symptoms will go away. Get medical help right away. Call your local emergency services (911 in the [place] Do not drive yourself to the hospital. Summary Toxic metabolic encephalopathy (TME) is a brain disorder that may result from illnesses or conditions that cause an imbalance of fluid, minerals (electrolytes), and other substances in the body that affect the way the brain functions. Your health care provider will diagnose TME based on your symptoms, behavior, physical exam and medical history, imaging, and blood tests. Your health care provider will also do an exam to check how you are thinking,  feeling, and behaving (mental status exam). TME is treated by identifying and correcting the underlying cause. This information is not intended to replace advice given to you by your health care provider. Make sure you discuss any questions you have with your health care provider. Document Revised: 04/06/2020 Document Reviewed: 04/06/2020 Elsevier Patient Education  [place]. Fever, Adult   A fever is an increase in your body's temperature. It often means a temperature of 100.50F (38C) or higher. Brief mild or moderate fevers often have no long-term effects. They often do not need treatment. Moderate or high fevers may make you feel uncomfortable. Sometimes, they can be a sign of a serious illness or disease. A fever that keeps coming back or that lasts a long time may cause you to lose water in your body (get dehydrated). You can take your temperature with a thermometer to see if you have a fever. Temperature can change with: Age. Time of day. Where the thermometer is put in the body. Readings may vary when the thermometer is put: In the mouth (oral). In the butt (rectal). In the ear (tympanic). Under the arm (axillary). On the forehead (temporal). Follow these instructions at home: Medicines Take over-the-counter and prescription medicines only as told by your doctor. Follow the dosing instructions carefully. If you were prescribed an antibiotic medicine, take it as told by your doctor. Do not stop taking it even if you start to feel better. General instructions Watch for any changes in your symptoms. Tell your doctor about them. Rest as needed. Drink enough fluid to keep your pee (urine) pale yellow. Sponge yourself or bathe with room-temperature water as needed. This helps to lower your body temperature. Do  not use ice water. Do not use too many blankets or wear clothes that are too heavy. If your fever was caused by an infection that spreads from person to person (is  contagious), such as a cold or the flu: You should stay home from work and public places for at least 24 hours after your fever is gone. Your fever should be gone for at least 24 hours without the need to use medicines. Contact a doctor if: You throw up (vomit). You cannot eat or drink without throwing up. You have watery poop (diarrhea). It hurts when you pee. Your symptoms do not get better with treatment. You have new symptoms. You feel very weak. Get help right away if: You are short of breath or have trouble breathing. You are dizzy or you pass out (faint). You feel mixed up (confused). You have signs of not having enough water in your body, such as: Dark pee, very little pee, or no pee. Cracked lips. Dry mouth. Sunken eyes. Sleepiness. Weakness. You have very bad pain in your belly (abdomen). You keep throwing up or having watery poop. You have a rash on your skin. Your symptoms get worse all of a sudden. Summary A fever is an increase in your body's temperature. It often means a temperature of 100.66F (38C) or higher. Watch for any changes in your symptoms. Tell your doctor about them. Take all medicines only as told by your doctor. Do not go to work or other public places if your fever was caused by an illness that can spread to other people. Get help right away if you have signs that you do not have enough water in your body. This information is not intended to replace advice given to you by your health care provider. Make sure you discuss any questions you have with your health care provider. Document Revised: 11/09/2020 Document Reviewed: 11/09/2020 Elsevier Patient Education  [place].

## 2021-06-22 NOTE — Progress Notes (Signed)
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 of Communication with Friends and Family: More than three times a week   Frequency of Social Gatherings with Friends and Family: Once a week   Attends Religious Services: Never   Marine scientist or Organizations: No   Attends Archivist Meetings: Never   Marital Status: Divorced  Human resources officer Violence: Not At Risk   Fear of Current or Ex-Partner: No   Emotionally Abused: No   Physically Abused: No   Sexually Abused: No      PHYSICAL EXAM  Vitals:   06/22/21 0941  BP: 129/87  Pulse: (!) 101  Weight: 210 lb (95.3 kg)  Height: 5\' 10"  (1.778 m)   Body mass index is 30.13 kg/m.  Generalized: Well developed.  I looked at both feet, these are not swollen, warm to touch and his capillary refill is prompt.  LN boil under the left armpit.  New onset tremor when he developed fever.   Neurological examination  Mentation: Drowsy, very poor verbal output. MS changes?   oriented to time, place, history taking. Follows all commands speech and language fluent, cooperative.   Cranial nerve :  No loss of smell or taste- Pupils were equal round reactive to light.  Extraocular movements were full, visual field  were full on confrontational test. Facial sensation and strength were normal. Uvula  and tongue midline. He speaks sparingly, low volume, slightly hoarse.  Head turning and shoulder shrug  ...   Motor: reduced strength left shoulder shrug- reduced ROM neck.  Sensory: feels vibration in thumb, interdigital ares and ankles and knees.  The patient describes a pain radiating from the shoulder and neck through the elbow along the anterior biceps area then extending below the elbow into thumb index finger middle finger and ring finger dorsal mountains, there is less sensory loss on the palm than on the back of the hand.  This is tingling and numbness rather than electric shooting sensations.  Coordination: he has tremor on finger to nose testing, hands were with good grip strength,  He insisted there are no changes in penmanship.  Gait and station: Gait is normal based. Tandem gait was deferred.No drift is seen.  Reflexes: Deep tendon reflexes are symmetric and normal bilaterally.   DIAGNOSTIC DATA (  LABS, IMAGING, TESTING) - I reviewed patient records, labs, notes, testing and imaging myself where available.  ED note, lab and images reviewed with patient and relative April.     Dr Rhodia Albright did  cardiac Echo, normal by report.   patient had high diastolic BP in ED and now low BP.  Blood cultures were not resulted. Urine culture was done.  Still elevated temperature.   Component Ref Range & Units 2 d ago  (06/20/21) 5 mo ago  (12/27/20) 6 mo ago  (12/18/20) 8 mo ago  (09/27/20) 1 yr ago  (07/01/19) 4 yr ago  (01/16/17) 4 yr ago  (09/25/16)  Color, Urine YELLOW YELLOW  YELLOW R  YELLOW  YELLOW   YELLOW  YELLOW   APPearance CLEAR CLEAR  CLEAR R  CLOUDY Abnormal   CLEAR   CLEAR  CLEAR   Specific Gravity, Urine 1.005 - 1.030 1.014  1.020 R  1.010  1.014  1.025  1.010  1.012   pH 5.0 - 8.0 5.0  6.0  6.0  5.0  5.5  5.0  6.0   Glucose, UA NEGATIVE mg/dL NEGATIVE   NEGATIVE  NEGATIVE  NEGATIVE   NEGATIVE  NEGATIVE   Hgb urine dipstick NEGATIVE SMALL Abnormal   TRACE-INTACT Abnormal  R  SMALL Abnormal   NEGATIVE  NEGATIVE  SMALL Abnormal   SMALL Abnormal    Bilirubin Urine NEGATIVE NEGATIVE  NEGATIVE R  NEGATIVE  NEGATIVE  NEGATIVE  NEGATIVE  NEGATIVE   Ketones, ur NEGATIVE mg/dL 20 Abnormal   NEGATIVE R  NEGATIVE  NEGATIVE  NEGATIVE  NEGATIVE  NEGATIVE   Protein, ur NEGATIVE mg/dL 100 Abnormal    100 Abnormal   100 Abnormal   100 Abnormal   NEGATIVE  100 Abnormal    Nitrite NEGATIVE NEGATIVE  NEGATIVE R  NEGATIVE  NEGATIVE  NEGATIVE  NEGATIVE  NEGATIVE   Leukocytes,Ua NEGATIVE NEGATIVE  TRACE Abnormal  R  LARGE Abnormal   NEGATIVE  SMALL Abnormal  CM     RBC / HPF 0 - 5 RBC/hpf 0-5  0-2/hpf R  21-50  0-5   0-5  0-5   WBC, UA 0 - 5 WBC/hpf 0-5  7-10/hpf Abnormal  R  >50 High   0-5   0-5  0-5   Bacteria, UA NONE SEEN RARE Abnormal    NONE SEEN  RARE Abnormal    NONE SEEN  NONE SEEN   Squamous Epithelial / LPF 0 - 5 0-5  Few(5-10/hpf) Abnormal  R  0-5  0-5 CM   0-5 Abnormal  R  0-5 Abnormal  R   Mucus  PRESENT   PRESENT CM    PRESENT    Hyaline Casts, UA  PRESENT      PRESENT  PRESENT   Comment: Performed at [facility], [place] [address], [place], [place] [address]  Total Protein, Urine   100 Abnormal  R        Urine Glucose   NEGATIVE R        Urobilinogen, UA   0.2 R    0.2 R     Leukocytes, UA       NEGATIVE R  TRACE Abnormal  R   Resulting Agency  [facility]      ASSESSMENT AND PLAN:   This long established HIV neuropathy patient is a 60  year old AA male : he has HIV  neuropathy, has been working as a Pharmacist, community. He is now loudly snoring but feeling sick . 7 momth after cervical anterior 2 level fusion came down with new radiculopathic symptoms. Affecting mostly the left arm and hand.   Febrile e illness with abnormal urine results, BC pending. NO MRI brain done yet- needs MRI brain and need CSF for culture,  protein, glucose and cells diff.    Xanax can bid be taken for anxiety, again except on days with Ambien. Patient failed Trazodone.   Did not need a refill of  gabapentin.   I spent over 40 minutes with this ill  patient.    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 Lynann Bologna.   This patient is febrile and has an underlying HIV related immunocompromise state.   He has been feeling very ill.  He has developed a tremor in both hands that was not seen before.   He is cognitively more drowsy less responsive less interactive as noted by by his family members.  We need CSF testing with opening pressure, culture, [place] ink, protein glucose, Cells and  differential.  I also need an MRI of the brain with and without contrast.  RV ASAP in February with my NP , needs to see his primary Internist.   50% of this time was spent counseling and educating patient on plan of care and medications.     Larey Seat, MD  06/22/2021, 9:57 AM [facility] [address] [place], [place] [address] [phone]

## 2021-06-23 ENCOUNTER — Emergency Department (HOSPITAL_COMMUNITY): Payer: Medicare Other

## 2021-06-23 ENCOUNTER — Telehealth: Payer: Self-pay | Admitting: *Deleted

## 2021-06-23 ENCOUNTER — Ambulatory Visit (HOSPITAL_COMMUNITY): Admission: RE | Admit: 2021-06-23 | Payer: Medicare Other | Source: Ambulatory Visit

## 2021-06-23 ENCOUNTER — Inpatient Hospital Stay (HOSPITAL_COMMUNITY)
Admission: EM | Admit: 2021-06-23 | Discharge: 2021-06-28 | DRG: 076 | Disposition: A | Discharge status: Home / Self Care | Payer: Medicare Other | Source: Ambulatory Visit | Attending: Internal Medicine | Admitting: Internal Medicine

## 2021-06-23 ENCOUNTER — Other Ambulatory Visit: Payer: Self-pay

## 2021-06-23 ENCOUNTER — Other Ambulatory Visit: Payer: PRIVATE HEALTH INSURANCE

## 2021-06-23 ENCOUNTER — Encounter (HOSPITAL_COMMUNITY): Payer: Self-pay | Admitting: *Deleted

## 2021-06-23 DIAGNOSIS — Z833 Family history of diabetes mellitus: Secondary | ICD-10-CM

## 2021-06-23 DIAGNOSIS — Z8249 Family history of ischemic heart disease and other diseases of the circulatory system: Secondary | ICD-10-CM | POA: Diagnosis not present

## 2021-06-23 DIAGNOSIS — Z981 Arthrodesis status: Secondary | ICD-10-CM

## 2021-06-23 DIAGNOSIS — J9811 Atelectasis: Secondary | ICD-10-CM | POA: Diagnosis not present

## 2021-06-23 DIAGNOSIS — Z808 Family history of malignant neoplasm of other organs or systems: Secondary | ICD-10-CM

## 2021-06-23 DIAGNOSIS — I10 Essential (primary) hypertension: Secondary | ICD-10-CM | POA: Diagnosis present

## 2021-06-23 DIAGNOSIS — E782 Mixed hyperlipidemia: Secondary | ICD-10-CM | POA: Diagnosis present

## 2021-06-23 DIAGNOSIS — N1831 Chronic kidney disease, stage 3a: Secondary | ICD-10-CM | POA: Diagnosis present

## 2021-06-23 DIAGNOSIS — Z9079 Acquired absence of other genital organ(s): Secondary | ICD-10-CM

## 2021-06-23 DIAGNOSIS — A879 Viral meningitis, unspecified: Secondary | ICD-10-CM | POA: Diagnosis not present

## 2021-06-23 DIAGNOSIS — N179 Acute kidney failure, unspecified: Secondary | ICD-10-CM | POA: Diagnosis not present

## 2021-06-23 DIAGNOSIS — R519 Headache, unspecified: Secondary | ICD-10-CM | POA: Diagnosis not present

## 2021-06-23 DIAGNOSIS — N281 Cyst of kidney, acquired: Secondary | ICD-10-CM | POA: Diagnosis not present

## 2021-06-23 DIAGNOSIS — B20 Human immunodeficiency virus [HIV] disease: Secondary | ICD-10-CM

## 2021-06-23 DIAGNOSIS — I129 Hypertensive chronic kidney disease with stage 1 through stage 4 chronic kidney disease, or unspecified chronic kidney disease: Secondary | ICD-10-CM | POA: Diagnosis present

## 2021-06-23 DIAGNOSIS — Z20822 Contact with and (suspected) exposure to covid-19: Secondary | ICD-10-CM | POA: Diagnosis present

## 2021-06-23 DIAGNOSIS — G039 Meningitis, unspecified: Secondary | ICD-10-CM | POA: Diagnosis not present

## 2021-06-23 DIAGNOSIS — K5900 Constipation, unspecified: Secondary | ICD-10-CM | POA: Diagnosis present

## 2021-06-23 DIAGNOSIS — R6889 Other general symptoms and signs: Secondary | ICD-10-CM | POA: Diagnosis not present

## 2021-06-23 DIAGNOSIS — R0602 Shortness of breath: Secondary | ICD-10-CM | POA: Diagnosis not present

## 2021-06-23 DIAGNOSIS — R0902 Hypoxemia: Secondary | ICD-10-CM | POA: Diagnosis not present

## 2021-06-23 DIAGNOSIS — D3501 Benign neoplasm of right adrenal gland: Secondary | ICD-10-CM | POA: Diagnosis present

## 2021-06-23 DIAGNOSIS — Z8042 Family history of malignant neoplasm of prostate: Secondary | ICD-10-CM | POA: Diagnosis not present

## 2021-06-23 DIAGNOSIS — Z21 Asymptomatic human immunodeficiency virus [HIV] infection status: Secondary | ICD-10-CM | POA: Diagnosis present

## 2021-06-23 DIAGNOSIS — G629 Polyneuropathy, unspecified: Secondary | ICD-10-CM | POA: Diagnosis present

## 2021-06-23 DIAGNOSIS — Z87891 Personal history of nicotine dependence: Secondary | ICD-10-CM | POA: Diagnosis not present

## 2021-06-23 DIAGNOSIS — J449 Chronic obstructive pulmonary disease, unspecified: Secondary | ICD-10-CM | POA: Diagnosis present

## 2021-06-23 DIAGNOSIS — N182 Chronic kidney disease, stage 2 (mild): Secondary | ICD-10-CM | POA: Diagnosis present

## 2021-06-23 DIAGNOSIS — Z79899 Other long term (current) drug therapy: Secondary | ICD-10-CM | POA: Diagnosis not present

## 2021-06-23 DIAGNOSIS — R509 Fever, unspecified: Secondary | ICD-10-CM

## 2021-06-23 DIAGNOSIS — R404 Transient alteration of awareness: Secondary | ICD-10-CM | POA: Diagnosis not present

## 2021-06-23 DIAGNOSIS — Z8546 Personal history of malignant neoplasm of prostate: Secondary | ICD-10-CM | POA: Diagnosis not present

## 2021-06-23 DIAGNOSIS — A419 Sepsis, unspecified organism: Secondary | ICD-10-CM | POA: Diagnosis not present

## 2021-06-23 DIAGNOSIS — I499 Cardiac arrhythmia, unspecified: Secondary | ICD-10-CM | POA: Diagnosis not present

## 2021-06-23 DIAGNOSIS — Z743 Need for continuous supervision: Secondary | ICD-10-CM | POA: Diagnosis not present

## 2021-06-23 DIAGNOSIS — Z7951 Long term (current) use of inhaled steroids: Secondary | ICD-10-CM

## 2021-06-23 DIAGNOSIS — Z0389 Encounter for observation for other suspected diseases and conditions ruled out: Secondary | ICD-10-CM | POA: Diagnosis not present

## 2021-06-23 LAB — COMPREHENSIVE METABOLIC PANEL
ALT: 20 U/L (ref 0–44)
AST: 23 U/L (ref 15–41)
Albumin: 4 g/dL (ref 3.5–5.0)
Alkaline Phosphatase: 77 U/L (ref 38–126)
Anion gap: 10 (ref 5–15)
BUN: 13 mg/dL (ref 6–20)
CO2: 25 mmol/L (ref 22–32)
Calcium: 9.2 mg/dL (ref 8.9–10.3)
Chloride: 99 mmol/L (ref 98–111)
Creatinine, Ser: 1.37 mg/dL — ABNORMAL HIGH (ref 0.61–1.24)
GFR, Estimated: 60 mL/min — ABNORMAL LOW (ref >60)
Glucose, Bld: 123 mg/dL — ABNORMAL HIGH (ref 70–99)
Potassium: 3.5 mmol/L (ref 3.5–5.1)
Sodium: 134 mmol/L — ABNORMAL LOW (ref 135–145)
Total Bilirubin: 1.6 mg/dL — ABNORMAL HIGH (ref 0.3–1.2)
Total Protein: 7.1 g/dL (ref 6.5–8.1)

## 2021-06-23 LAB — CBC WITH DIFFERENTIAL/PLATELET
Abs Immature Granulocytes: 0.02 10*3/uL (ref 0.00–0.07)
Basophils Absolute: 0 10*3/uL (ref 0.0–0.1)
Basophils Relative: 1 %
Eosinophils Absolute: 0.1 10*3/uL (ref 0.0–0.5)
Eosinophils Relative: 2 %
HCT: 44.9 % (ref 39.0–52.0)
Hemoglobin: 15.2 g/dL (ref 13.0–17.0)
Immature Granulocytes: 0 %
Lymphocytes Relative: 24 %
Lymphs Abs: 1.4 10*3/uL (ref 0.7–4.0)
MCH: 31.4 pg (ref 26.0–34.0)
MCHC: 33.9 g/dL (ref 30.0–36.0)
MCV: 92.8 fL (ref 80.0–100.0)
Monocytes Absolute: 0.5 10*3/uL (ref 0.1–1.0)
Monocytes Relative: 8 %
Neutro Abs: 3.7 10*3/uL (ref 1.7–7.7)
Neutrophils Relative %: 65 %
Platelets: 139 10*3/uL — ABNORMAL LOW (ref 150–400)
RBC: 4.84 MIL/uL (ref 4.22–5.81)
RDW: 13.2 % (ref 11.5–15.5)
WBC: 5.7 10*3/uL (ref 4.0–10.5)
nRBC: 0 % (ref 0.0–0.2)

## 2021-06-23 LAB — GLUCOSE, CSF: Glucose, CSF: 59 mg/dL (ref 40–70)

## 2021-06-23 LAB — CSF CELL COUNT WITH DIFFERENTIAL
Eosinophils, CSF: 0 % (ref 0–1)
Lymphs, CSF: 96 % — ABNORMAL HIGH (ref 40–80)
Monocyte-Macrophage-Spinal Fluid: 4 % — ABNORMAL LOW (ref 15–45)
RBC Count, CSF: 44 /mm3 — ABNORMAL HIGH
Segmented Neutrophils-CSF: 0 % (ref 0–6)
Tube #: 3
WBC, CSF: 93 /mm3 (ref 0–5)

## 2021-06-23 LAB — URINALYSIS, ROUTINE W REFLEX MICROSCOPIC
Bilirubin Urine: NEGATIVE
Glucose, UA: NEGATIVE mg/dL
Hgb urine dipstick: NEGATIVE
Ketones, ur: 20 mg/dL — AB
Nitrite: NEGATIVE
Protein, ur: 100 mg/dL — AB
Specific Gravity, Urine: 1.017 (ref 1.005–1.030)
pH: 5 (ref 5.0–8.0)

## 2021-06-23 LAB — PROTEIN, CSF: Total  Protein, CSF: 110 mg/dL — ABNORMAL HIGH (ref 15–45)

## 2021-06-23 LAB — I-STAT CHEM 8, ED
BUN: 16 mg/dL (ref 6–20)
Calcium, Ion: 1.08 mmol/L — ABNORMAL LOW (ref 1.15–1.40)
Chloride: 96 mmol/L — ABNORMAL LOW (ref 98–111)
Creatinine, Ser: 1.3 mg/dL — ABNORMAL HIGH (ref 0.61–1.24)
Glucose, Bld: 110 mg/dL — ABNORMAL HIGH (ref 70–99)
HCT: 47 % (ref 39.0–52.0)
Hemoglobin: 16 g/dL (ref 13.0–17.0)
Potassium: 3.9 mmol/L (ref 3.5–5.1)
Sodium: 137 mmol/L (ref 135–145)
TCO2: 29 mmol/L (ref 22–32)

## 2021-06-23 LAB — CRYPTOCOCCAL ANTIGEN, CSF: Crypto Ag: NEGATIVE

## 2021-06-23 LAB — PROTIME-INR
INR: 1 (ref 0.8–1.2)
Prothrombin Time: 13.6 seconds (ref 11.4–15.2)

## 2021-06-23 LAB — LIPASE, BLOOD: Lipase: 54 U/L — ABNORMAL HIGH (ref 11–51)

## 2021-06-23 LAB — RESP PANEL BY RT-PCR (FLU A&B, COVID) ARPGX2
Influenza A by PCR: NEGATIVE
Influenza B by PCR: NEGATIVE
SARS Coronavirus 2 by RT PCR: NEGATIVE

## 2021-06-23 LAB — APTT: aPTT: 23 seconds — ABNORMAL LOW (ref 24–36)

## 2021-06-23 LAB — LACTIC ACID, PLASMA
Lactic Acid, Venous: 1.2 mmol/L (ref 0.5–1.9)
Lactic Acid, Venous: 1.3 mmol/L (ref 0.5–1.9)

## 2021-06-23 MED ORDER — HYDROMORPHONE HCL 1 MG/ML IJ SOLN
0.5000 mg | Freq: Once | INTRAMUSCULAR | Status: AC
  Administered 2021-06-23: 15:00:00 0.5 mg via INTRAVENOUS
  Filled 2021-06-23: qty 1

## 2021-06-23 MED ORDER — HYDROXYZINE HCL 25 MG PO TABS
25.0000 mg | ORAL_TABLET | Freq: Every day | ORAL | Status: DC
  Administered 2021-06-23 – 2021-06-27 (×4): 25 mg via ORAL
  Filled 2021-06-23 (×5): qty 1

## 2021-06-23 MED ORDER — FLUTICASONE FUROATE-VILANTEROL 200-25 MCG/ACT IN AEPB
1.0000 | INHALATION_SPRAY | Freq: Every day | RESPIRATORY_TRACT | Status: DC
  Administered 2021-06-24 – 2021-06-28 (×4): 1 via RESPIRATORY_TRACT
  Filled 2021-06-23: qty 28

## 2021-06-23 MED ORDER — PANTOPRAZOLE SODIUM 40 MG PO TBEC
40.0000 mg | DELAYED_RELEASE_TABLET | Freq: Two times a day (BID) | ORAL | Status: DC
  Administered 2021-06-23 – 2021-06-28 (×10): 40 mg via ORAL
  Filled 2021-06-23 (×10): qty 1

## 2021-06-23 MED ORDER — SODIUM CHLORIDE 0.9 % IV BOLUS
1000.0000 mL | Freq: Once | INTRAVENOUS | Status: AC
  Administered 2021-06-23: 08:00:00 1000 mL via INTRAVENOUS

## 2021-06-23 MED ORDER — UMECLIDINIUM BROMIDE 62.5 MCG/ACT IN AEPB
1.0000 | INHALATION_SPRAY | Freq: Every day | RESPIRATORY_TRACT | Status: DC
  Administered 2021-06-24 – 2021-06-28 (×4): 1 via RESPIRATORY_TRACT
  Filled 2021-06-23: qty 7

## 2021-06-23 MED ORDER — FENTANYL CITRATE PF 50 MCG/ML IJ SOSY
25.0000 ug | PREFILLED_SYRINGE | Freq: Once | INTRAMUSCULAR | Status: AC
  Administered 2021-06-23: 11:00:00 25 ug via INTRAVENOUS
  Filled 2021-06-23: qty 1

## 2021-06-23 MED ORDER — ALBUTEROL SULFATE (2.5 MG/3ML) 0.083% IN NEBU: 2.5000 mg | INHALATION_SOLUTION | Freq: Four times a day (QID) | RESPIRATORY_TRACT | Status: DC | PRN

## 2021-06-23 MED ORDER — HYDRALAZINE HCL 20 MG/ML IJ SOLN: 5.0000 mg | INTRAMUSCULAR | Status: DC | PRN

## 2021-06-23 MED ORDER — ONDANSETRON HCL 4 MG PO TABS: 4.0000 mg | ORAL_TABLET | Freq: Four times a day (QID) | ORAL | Status: DC | PRN

## 2021-06-23 MED ORDER — ENOXAPARIN SODIUM 40 MG/0.4ML IJ SOSY
40.0000 mg | PREFILLED_SYRINGE | INTRAMUSCULAR | Status: DC
  Filled 2021-06-23 (×4): qty 0.4

## 2021-06-23 MED ORDER — FLUTICASONE PROPIONATE 50 MCG/ACT NA SUSP
2.0000 | Freq: Every day | NASAL | Status: DC
  Administered 2021-06-24 – 2021-06-28 (×4): 2 via NASAL
  Filled 2021-06-23: qty 16

## 2021-06-23 MED ORDER — LORATADINE 10 MG PO TABS
10.0000 mg | ORAL_TABLET | Freq: Every day | ORAL | Status: DC
  Administered 2021-06-24 – 2021-06-28 (×5): 10 mg via ORAL
  Filled 2021-06-23 (×5): qty 1

## 2021-06-23 MED ORDER — BICTEGRAVIR-EMTRICITAB-TENOFOV 50-200-25 MG PO TABS
1.0000 | ORAL_TABLET | Freq: Every day | ORAL | Status: DC
  Administered 2021-06-24 – 2021-06-28 (×5): 1 via ORAL
  Filled 2021-06-23 (×5): qty 1

## 2021-06-23 MED ORDER — DIPHENHYDRAMINE HCL 25 MG PO CAPS
25.0000 mg | ORAL_CAPSULE | Freq: Four times a day (QID) | ORAL | Status: DC | PRN
  Administered 2021-06-24: 22:00:00 25 mg via ORAL
  Filled 2021-06-23: qty 1

## 2021-06-23 MED ORDER — DIPHENHYDRAMINE HCL 50 MG/ML IJ SOLN
25.0000 mg | Freq: Once | INTRAMUSCULAR | Status: AC
  Administered 2021-06-23: 08:00:00 25 mg via INTRAVENOUS
  Filled 2021-06-23: qty 1

## 2021-06-23 MED ORDER — BISACODYL 5 MG PO TBEC: 5.0000 mg | DELAYED_RELEASE_TABLET | Freq: Every day | ORAL | Status: DC | PRN

## 2021-06-23 MED ORDER — SODIUM CHLORIDE 0.9% FLUSH
3.0000 mL | Freq: Two times a day (BID) | INTRAVENOUS | Status: DC
  Administered 2021-06-23 – 2021-06-28 (×9): 3 mL via INTRAVENOUS

## 2021-06-23 MED ORDER — BUDESON-GLYCOPYRROL-FORMOTEROL 160-9-4.8 MCG/ACT IN AERO: 2.0000 | INHALATION_SPRAY | Freq: Two times a day (BID) | RESPIRATORY_TRACT | Status: DC

## 2021-06-23 MED ORDER — ONDANSETRON HCL 4 MG/2ML IJ SOLN
4.0000 mg | Freq: Four times a day (QID) | INTRAMUSCULAR | Status: DC | PRN
  Administered 2021-06-23 – 2021-06-28 (×10): 4 mg via INTRAVENOUS
  Filled 2021-06-23 (×10): qty 2

## 2021-06-23 MED ORDER — METOCLOPRAMIDE HCL 5 MG/ML IJ SOLN
10.0000 mg | Freq: Once | INTRAMUSCULAR | Status: AC
  Administered 2021-06-23: 08:00:00 10 mg via INTRAVENOUS
  Filled 2021-06-23: qty 2

## 2021-06-23 MED ORDER — SODIUM CHLORIDE 0.9 % IV SOLN: INTRAVENOUS | Status: DC

## 2021-06-23 MED ORDER — DOCUSATE SODIUM 100 MG PO CAPS
100.0000 mg | ORAL_CAPSULE | Freq: Two times a day (BID) | ORAL | Status: DC
  Administered 2021-06-23 – 2021-06-27 (×9): 100 mg via ORAL
  Filled 2021-06-23 (×9): qty 1

## 2021-06-23 MED ORDER — LACTATED RINGERS IV SOLN: INTRAVENOUS | Status: DC

## 2021-06-23 MED ORDER — METHOCARBAMOL 500 MG PO TABS
500.0000 mg | ORAL_TABLET | Freq: Four times a day (QID) | ORAL | Status: DC
  Administered 2021-06-23 – 2021-06-28 (×19): 500 mg via ORAL
  Filled 2021-06-23 (×19): qty 1

## 2021-06-23 MED ORDER — DEXTROSE 5 % IV SOLN
10.0000 mg/kg | Freq: Three times a day (TID) | INTRAVENOUS | Status: DC
  Administered 2021-06-23 – 2021-06-27 (×11): 950 mg via INTRAVENOUS
  Filled 2021-06-23 (×13): qty 19

## 2021-06-23 MED ORDER — POLYETHYLENE GLYCOL 3350 17 G PO PACK: 17.0000 g | PACK | Freq: Every day | ORAL | Status: DC | PRN

## 2021-06-23 MED ORDER — TIZANIDINE HCL 2 MG PO TABS
6.0000 mg | ORAL_TABLET | Freq: Three times a day (TID) | ORAL | Status: DC
  Administered 2021-06-23 – 2021-06-28 (×15): 6 mg via ORAL
  Filled 2021-06-23 (×18): qty 3

## 2021-06-23 MED ORDER — HYDROCODONE-ACETAMINOPHEN 5-325 MG PO TABS
1.0000 | ORAL_TABLET | Freq: Four times a day (QID) | ORAL | Status: DC | PRN
Start: 2021-06-23 — End: 2021-06-26
  Administered 2021-06-23 – 2021-06-24 (×2): 2 via ORAL
  Filled 2021-06-23 (×3): qty 2

## 2021-06-23 MED ORDER — ACYCLOVIR 200 MG PO CAPS
400.0000 mg | ORAL_CAPSULE | Freq: Once | ORAL | Status: AC
  Administered 2021-06-23: 15:00:00 400 mg via ORAL
  Filled 2021-06-23 (×2): qty 2

## 2021-06-23 MED ORDER — ALPRAZOLAM 0.5 MG PO TABS
0.5000 mg | ORAL_TABLET | Freq: Two times a day (BID) | ORAL | Status: DC | PRN
  Administered 2021-06-27: 23:00:00 0.5 mg via ORAL
  Filled 2021-06-23 (×2): qty 1

## 2021-06-23 MED ORDER — LIDOCAINE HCL (PF) 1 % IJ SOLN
10.0000 mL | Freq: Once | INTRAMUSCULAR | Status: AC
  Administered 2021-06-23: 10:00:00 10 mL via INTRADERMAL

## 2021-06-23 MED ORDER — MONTELUKAST SODIUM 10 MG PO TABS
10.0000 mg | ORAL_TABLET | Freq: Every day | ORAL | Status: DC
  Administered 2021-06-24 – 2021-06-28 (×5): 10 mg via ORAL
  Filled 2021-06-23 (×5): qty 1

## 2021-06-23 MED ORDER — FENTANYL CITRATE PF 50 MCG/ML IJ SOSY
12.5000 ug | PREFILLED_SYRINGE | INTRAMUSCULAR | Status: DC | PRN
  Administered 2021-06-23 – 2021-06-24 (×3): 12.5 ug via INTRAVENOUS
  Filled 2021-06-23 (×3): qty 1

## 2021-06-23 NOTE — Assessment & Plan Note (Addendum)
-  This is likely viral meningitis based on presentation of headache, dizziness and other vague symptoms, but low suspicion for bacterial meningitis. -Will also need to cover empirically for aseptic meningitis with IV acyclovir; will try to maintain at least 3L IVF/day if the patient is taking IV acyclovir. -F/u HSV/VZV PCR (ordered by ID) -Appears to be immunocompetent

## 2021-06-23 NOTE — ED Notes (Signed)
Pt still c/o HA at this time. MD aware

## 2021-06-23 NOTE — Assessment & Plan Note (Signed)
-  He does not appear to be taking medications for this issue at this time -Most recent LDL was 123 -He is likely to benefit from initiation of statin therapy

## 2021-06-23 NOTE — Telephone Encounter (Signed)
Called and spoke w/ daughter, April. Relayed MRI results per Dr. Edwena Felty note. She verbalized understanding.  Pt currently admitted at [facility]. Recently dx w/ viral meningitis. Just given first dose of acyclovir. Waiting on one lab to come back to ensure its not bacterial meningitis. Should be there only 2-3 days for treatment unless it comes back + for bacterial meningitis, then hell be there 5 days. He is set up to see infectious disease tomorrow. They will call back if anything further needed from our office.  I provided update to Dr. Brett Fairy verbally on pt.

## 2021-06-23 NOTE — Assessment & Plan Note (Signed)
-  Remote history -No current concerns

## 2021-06-23 NOTE — ED Notes (Signed)
ED TO INPATIENT HANDOFF REPORT  ED Nurse Name and Phone #: Cindie Laroche [phone]  S Name/Age/Gender Casey Brennan 60 y.o. male Room/Bed: 024C/024C  Code Status   Code Status: Full Code  Home/SNF/Other Home Patient oriented to: self, place, time, and situation Is this baseline? Yes   Triage Complete: Triage complete  Chief Complaint Viral meningitis, unspecified [A87.9]  Triage Note Patient presents to ed via [facility] from home c/o vomiting onset Tues states he was seen by his PCP had MRI yest is scheduled for spinal tap today PCP thinks patient may have spinal fluid infection. Patient was given Zofran 4 mg IM per ems, Patient is actively vomiting at triage.    Allergies Allergies  Allergen Reactions   Amoxapine Shortness Of Breath   Chocolate Shortness Of Breath and Other (See Comments)    Asthma attack, welts   Descovy [Emtricitabine-Tenofovir Af] Shortness Of Breath    Nasuea, vomiiting, diarrhea, sob, whelps, throat closes   Genvoya [Elviteg-Cobic-Emtricit-Tenofaf] Shortness Of Breath    Nausea, vomitting, diarreha, sob, rash, throat closing   Morphine And Related Shortness Of Breath, Itching and Other (See Comments)     chest pain   Oxycodone-Acetaminophen Shortness Of Breath   Penicillins Shortness Of Breath and Rash    Has patient had a PCN reaction causing immediate rash, facial/tongue/throat swelling, SOB or lightheadedness with hypotension: Yes Has patient had a PCN reaction causing severe rash involving mucus membranes or skin necrosis: No Has patient had a PCN reaction that required hospitalization No Has patient had a PCN reaction occurring within the last 10 years: Yes If all of the above answers are "NO", then may proceed with Cephalosporin use.   Sulfa Antibiotics Anaphylaxis and Other (See Comments)   Tomato Shortness Of Breath   Vancomycin Shortness Of Breath and Other (See Comments)   Darunavir Itching   Carvedilol     LEG SWELLING, DIZZNESS   Flagyl  [Metronidazole] Itching   Milk-Related Compounds    Morphine Other (See Comments)   Penicillin G Other (See Comments)   Toradol [Ketorolac Tromethamine] Nausea And Vomiting    itching   Acetaminophen Rash and Other (See Comments)    Pt tolerates percocet and also plain APAP without rash   Tramadol Itching, Nausea And Vomiting and Other (See Comments)    Level of Care/Admitting Diagnosis ED Disposition     ED Disposition  Admit   Condition  --   [place]: [facility] [[id number]]  Level of Care: Telemetry Medical [104]  May admit patient to [facility] or [facility] if equivalent level of care is available:: No  Covid Evaluation: Asymptomatic Screening Protocol (No Symptoms)  Diagnosis: Viral meningitis, unspecified [[id number]]  Admitting Physician: Karmen Bongo [[id number]]  Attending Physician: Karmen Bongo [[id number]]  Estimated length of stay: past midnight tomorrow  Certification:: I certify this patient will need inpatient services for at least 2 midnights          B Medical/Surgery History Past Medical History:  Diagnosis Date   Asthma    very rare   Chronic anemia    normocytic   Chronic folliculitis    Environmental and seasonal allergies    H/O pericarditis    2010--  myopercarditis--  resolved   Headache(784.0)    HX SEVERE FRONTAL HA'S   Hiatal hernia    History of gastric ulcer    History of kidney stones    History of MRSA infection 2010   infected boil  HIV (human immunodeficiency virus infection) ([place]) 1988   Hypertension    Lytic bone lesion of hip    WORK-UP DONE BY ONCOLOGIST DR HA --  NOT MALIGNANT   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   Pneumonia    hx of   Post concussion syndrome    resolved   Prostate cancer ([place]) 04/25/2013   gleason 3+3=6, volume 30 gm   Ulcer    hx of gastric   Past Surgical History:  Procedure Laterality Date   ACDCF  09/29/2020   ANTERIOR CERVICAL DECOMPRESSION/DISCECTOMY FUSION  4 LEVELS N/A 09/29/2020   Procedure: ANTERIOR CERVICAL DECOMPRESSION FUSION CERVICAL 3-CERVICAL 4 , CERVICAL 4 - CERVICAL 5, CERVICAL 5 - CERVICAL 6 WITH INSTRUMENTATION AND ALLOGRAFT;  Surgeon: Phylliss Bob, MD;  Location: [facility];  Service: Orthopedics;  Laterality: N/A;   CARDIAC CATHETERIZATION  03-23-2009  DR COOPER   NORMAL CORONARY ARTERIES   CHOLECYSTECTOMY N/A 11/06/2014   Procedure: LAPAROSCOPIC CHOLECYSTECTOMY WITH INTRAOPERATIVE CHOLANGIOGRAM;  Surgeon: Judeth Horn, MD;  Location: [facility];  Service: General;  Laterality: N/A;   COLONOSCOPY  12/26/2011   Procedure: COLONOSCOPY;  Surgeon: Lear Ng, MD;  [facility];  Service: Endoscopy;  Laterality: N/A;   COLONOSCOPY     COLONOSCOPY WITH PROPOFOL N/A 07/29/2014   Procedure: COLONOSCOPY WITH PROPOFOL;  Surgeon: Lear Ng, MD;  Location: [facility];  Service: Endoscopy;  Laterality: N/A;   CYSTOSCOPY N/A 05/18/2018   Procedure: CYSTOSCOPY REMOVAL FOREIGN BODY;  Surgeon: Kathie Rhodes, MD;  Location: [facility];  Service: Urology;  Laterality: N/A;   CYSTOSCOPY/RETROGRADE/URETEROSCOPY Bilateral 04/25/2013   Procedure: CYSTOSCOPY/ BILATERAL RETROGRADES; BLADDER BIOPSIES;  Surgeon: Alexis Frock, MD;  Location[facility];  Service: Urology;  Laterality: Bilateral;   DENTAL EXAMINATION UNDER ANESTHESIA     ESOPHAGOGASTRODUODENOSCOPY  12/26/2011   Procedure: ESOPHAGOGASTRODUODENOSCOPY (EGD);  Surgeon: Lear Ng, MD;  [facility];  Service: Endoscopy;  Laterality: N/A;   ESOPHAGOGASTRODUODENOSCOPY (EGD) WITH PROPOFOL N/A 07/29/2014   Procedure: ESOPHAGOGASTRODUODENOSCOPY (EGD) WITH PROPOFOL;  Surgeon: Lear Ng, MD;  Location: [facility];  Service: Endoscopy;  Laterality: N/A;   EXCISION CHRONIC LEFT BREAST ABSCESS  09-21-2010   EXCISIONAL BX LEFT BREAST MASS/  I  &  D LEFT BREAST ABSCESS  03-24-2009   KNEE ARTHROSCOPY Right 1985   left axilla biopsy  04/2013   left  hip biopsy  10/2012   LYMPHADENECTOMY Bilateral 08/18/2013   Procedure: LYMPHADENECTOMY "PELVIC LYMPH NODE DISSECTION";  Surgeon: Alexis Frock, MD;  [facility];  Service: Urology;  Laterality: Bilateral;   PROSTATE BIOPSY N/A 04/25/2013   Procedure: BIOPSY TRANSRECTAL ULTRASONIC PROSTATE (TUBP);  Surgeon: Alexis Frock, MD;  Location[facility];  Service: Urology;  Laterality: N/A;   ROBOT ASSISTED LAPAROSCOPIC RADICAL PROSTATECTOMY N/A 08/18/2013   Procedure: ROBOTIC ASSISTED LAPAROSCOPIC RADICAL PROSTATECTOMY;  Surgeon: Alexis Frock, MD;  [facility];  Service: Urology;  Laterality: N/A;   TOTAL KNEE ARTHROPLASTY Right 08/01/2016   08/17/16   TOTAL KNEE ARTHROPLASTY Right 08/01/2016   Procedure: TOTAL KNEE ARTHROPLASTY;  Surgeon: Renette Butters, MD;  Location: [facility];  Service: Orthopedics;  Laterality: Right;   UPPER GASTROINTESTINAL ENDOSCOPY     UPPER LEG SOFT TISSUE BIOPSY Left 2012   lthigh     A IV Location/Drains/Wounds Patient Lines/Drains/Airways Status     Active Line/Drains/Airways     Name Placement date Placement time Site Days   Peripheral IV 06/23/21 20 G Anterior;Distal;Left;Upper Arm 06/23/21  0736  Arm  less than 1   Peripheral IV 06/23/21 20 G Anterior;Left Forearm 06/23/21  0756  Forearm  less than 1   Incision (Closed) 08/01/16 Leg Right 08/01/16  1225  -- 1787   Incision (Closed) 05/18/18 Penis Other (Comment) 05/18/18  2028  -- 1132   Incision (Closed) 09/29/20 Neck Other (Comment) 09/29/20  0834  -- 267            Intake/Output Last 24 hours No intake or output data in the 24 hours ending 06/23/21 1648  Labs/Imaging Results for orders placed or performed during the hospital encounter of 06/23/21 (from the past 48 hour(s))  Comprehensive metabolic panel     Status: Abnormal   Collection Time: 06/23/21  6:45 AM  Result Value Ref Range   Sodium 134 (L) 135 - 145 mmol/L   Potassium 3.5 3.5 - 5.1 mmol/L   Chloride 99 98 -  111 mmol/L   CO2 25 22 - 32 mmol/L   Glucose, Bld 123 (H) 70 - 99 mg/dL    Comment: Glucose reference range applies only to samples taken after fasting for at least 8 hours.   BUN 13 6 - 20 mg/dL   Creatinine, Ser 1.37 (H) 0.61 - 1.24 mg/dL   Calcium 9.2 8.9 - 10.3 mg/dL   Total Protein 7.1 6.5 - 8.1 g/dL   Albumin 4.0 3.5 - 5.0 g/dL   AST 23 15 - 41 U/L   ALT 20 0 - 44 U/L   Alkaline Phosphatase 77 38 - 126 U/L   Total Bilirubin 1.6 (H) 0.3 - 1.2 mg/dL   GFR, Estimated 60 (L) >60 mL/min    Comment: (NOTE) Calculated using the CKD-EPI Creatinine Equation (2021)    Anion gap 10 5 - 15    Comment: Performed at [facility] [address] [place], [place] [address]  CBC WITH DIFFERENTIAL     Status: Abnormal   Collection Time: 06/23/21  6:45 AM  Result Value Ref Range   WBC 5.7 4.0 - 10.5 K/uL   RBC 4.84 4.22 - 5.81 MIL/uL   Hemoglobin 15.2 13.0 - 17.0 g/dL   HCT 44.9 39.0 - 52.0 %   MCV 92.8 80.0 - 100.0 fL   MCH 31.4 26.0 - 34.0 pg   MCHC 33.9 30.0 - 36.0 g/dL   RDW 13.2 11.5 - 15.5 %   Platelets 139 (L) 150 - 400 K/uL    Comment: REPEATED TO VERIFY   nRBC 0.0 0.0 - 0.2 %   Neutrophils Relative % 65 %   Neutro Abs 3.7 1.7 - 7.7 K/uL   Lymphocytes Relative 24 %   Lymphs Abs 1.4 0.7 - 4.0 K/uL   Monocytes Relative 8 %   Monocytes Absolute 0.5 0.1 - 1.0 K/uL   Eosinophils Relative 2 %   Eosinophils Absolute 0.1 0.0 - 0.5 K/uL   Basophils Relative 1 %   Basophils Absolute 0.0 0.0 - 0.1 K/uL   Immature Granulocytes 0 %   Abs Immature Granulocytes 0.02 0.00 - 0.07 K/uL    Comment: Performed at [facility] [address], [place], [place] [address]  Lipase, blood     Status: Abnormal   Collection Time: 06/23/21  6:45 AM  Result Value Ref Range   Lipase 54 (H) 11 - 51 U/L    Comment: Performed at [facility], [place] [address], [place], [place] [address]  Lactic acid, plasma     Status: None  Collection Time: 06/23/21  7:38 AM  Result Value Ref Range    Lactic Acid, Venous 1.2 0.5 - 1.9 mmol/L    Comment: Performed at [facility], [place] [address], [place], [place] [address]  Resp Panel by RT-PCR (Flu A&B, Covid)     Status: None   Collection Time: 06/23/21  7:38 AM   Specimen: Nasopharyngeal(NP) swabs in vial transport medium  Result Value Ref Range   SARS Coronavirus 2 by RT PCR NEGATIVE NEGATIVE    Comment: (NOTE) SARS-CoV-2 target nucleic acids are NOT DETECTED.  The SARS-CoV-2 RNA is generally detectable in upper respiratory specimens during the acute phase of infection. The lowest concentration of SARS-CoV-2 viral copies this assay can detect is 138 copies/mL. A negative result does not preclude SARS-Cov-2 infection and should not be used as the sole basis for treatment or other patient management decisions. A negative result may occur with  improper specimen collection/handling, submission of specimen other than nasopharyngeal swab, presence of viral mutation(s) within the areas targeted by this assay, and inadequate number of viral copies(<138 copies/mL). A negative result must be combined with clinical observations, patient history, and epidemiological information. The expected result is Negative.  Fact Sheet for Patients:  EntrepreneurPulse.com.au  Fact Sheet for Healthcare Providers:  IncredibleEmployment.be  This test is no t yet approved or cleared by the [place] FDA and  has been authorized for detection and/or diagnosis of SARS-CoV-2 by FDA under an Emergency Use Authorization (EUA). This EUA will remain  in effect (meaning this test can be used) for the duration of the COVID-19 declaration under Section 564(b)(1) of the Act, 21 U.S.C.section 360bbb-3(b)(1), unless the authorization is terminated  or revoked sooner.       Influenza A by PCR NEGATIVE NEGATIVE   Influenza B by PCR NEGATIVE NEGATIVE    Comment: (NOTE) The Xpert Xpress SARS-CoV-2/FLU/RSV plus assay  is intended as an aid in the diagnosis of influenza from Nasopharyngeal swab specimens and should not be used as a sole basis for treatment. Nasal washings and aspirates are unacceptable for Xpert Xpress SARS-CoV-2/FLU/RSV testing.  Fact Sheet for Patients: EntrepreneurPulse.com.au  Fact Sheet for Healthcare Providers: IncredibleEmployment.be  This test is not yet approved or cleared by the [place] FDA and has been authorized for detection and/or diagnosis of SARS-CoV-2 by FDA under an Emergency Use Authorization (EUA). This EUA will remain in effect (meaning this test can be used) for the duration of the COVID-19 declaration under Section 564(b)(1) of the Act, 21 U.S.C. section 360bbb-3(b)(1), unless the authorization is terminated or revoked.  Performed at [facility], [place] [address], [place], [place] [address]   Protime-INR     Status: None   Collection Time: 06/23/21  7:38 AM  Result Value Ref Range   Prothrombin Time 13.6 11.4 - 15.2 seconds   INR 1.0 0.8 - 1.2    Comment: (NOTE) INR goal varies based on device and disease states. Performed at [facility], [place] [address] [place], [place] [address]   APTT     Status: Abnormal   Collection Time: 06/23/21  7:38 AM  Result Value Ref Range   aPTT 23 (L) 24 - 36 seconds    Comment: Performed at [facility] [address] [place], [place] [address]  I-Stat Chem 8, ED     Status: Abnormal   Collection Time: 06/23/21  7:48 AM  Result Value Ref Range   Sodium 137 135 - 145  mmol/L   Potassium 3.9 3.5 - 5.1 mmol/L   Chloride 96 (L) 98 - 111 mmol/L   BUN 16 6 - 20 mg/dL   Creatinine, Ser 1.30 (H) 0.61 - 1.24 mg/dL   Glucose, Bld 110 (H) 70 - 99 mg/dL    Comment: Glucose reference range applies only to samples taken after fasting for at least 8 hours.   Calcium, Ion 1.08 (L) 1.15 - 1.40 mmol/L   TCO2 29 22 - 32 mmol/L   Hemoglobin 16.0 13.0 - 17.0 g/dL   HCT  47.0 39.0 - 52.0 %  Glucose, CSF     Status: None   Collection Time: 06/23/21 10:11 AM  Result Value Ref Range   Glucose, CSF 59 40 - 70 mg/dL    Comment: Performed at [facility] [address], [place], [place] [address]  Protein, CSF     Status: Abnormal   Collection Time: 06/23/21 10:11 AM  Result Value Ref Range   Total  Protein, CSF 110 (H) 15 - 45 mg/dL    Comment: Performed at [facility] [address] [place], [place] [address]  CSF cell count with differential     Status: Abnormal   Collection Time: 06/23/21 10:11 AM  Result Value Ref Range   Tube # 3    Color, CSF COLORLESS COLORLESS   Appearance, CSF CLEAR CLEAR   Supernatant NOT INDICATED    RBC Count, CSF 44 (H) 0 /cu mm   WBC, CSF 93 (HH) 0 - 5 /cu mm    Comment: CRITICAL RESULT CALLED TO, READ BACK BY AND VERIFIED WITH: M.Lakresha Stifter RN 1302 06/23/21 MCCORMICK K    Segmented Neutrophils-CSF 0 0 - 6 %   Lymphs, CSF 96 (H) 40 - 80 %   Monocyte-Macrophage-Spinal Fluid 4 (L) 15 - 45 %   Eosinophils, CSF 0 0 - 1 %    Comment: Performed at [facility] [address], [place], [place] [address]  CSF culture w Gram Stain     Status: None (Preliminary result)   Collection Time: 06/23/21 10:11 AM   Specimen: PATH Cytology CSF; Cerebrospinal Fluid  Result Value Ref Range   Specimen Description CSF    Special Requests CYTO    Gram Stain      CYTOSPIN SMEAR WBC PRESENT, PREDOMINANTLY MONONUCLEAR NO ORGANISMS SEEN Performed at [facility] [address] [place], [place] [address]    Culture PENDING    Report Status PENDING   Cryptococcal antigen, CSF     Status: None   Collection Time: 06/23/21 10:13 AM  Result Value Ref Range   Crypto Ag NEGATIVE NEGATIVE   Cryptococcal Ag Titer NOT INDICATED NOT INDICATED    Comment: Performed at [facility], [place] [address] [place], [place] [address]  Urinalysis, Routine w reflex microscopic Urine, Clean Catch     Status: Abnormal   Collection  Time: 06/23/21 11:00 AM  Result Value Ref Range   Color, Urine YELLOW YELLOW   APPearance HAZY (A) CLEAR   Specific Gravity, Urine 1.017 1.005 - 1.030   pH 5.0 5.0 - 8.0   Glucose, UA NEGATIVE NEGATIVE mg/dL   Hgb urine dipstick NEGATIVE NEGATIVE   Bilirubin Urine NEGATIVE NEGATIVE   Ketones, ur 20 (A) NEGATIVE mg/dL   Protein, ur 100 (A) NEGATIVE mg/dL   Nitrite NEGATIVE NEGATIVE   Leukocytes,Ua TRACE (A) NEGATIVE   RBC / HPF 0-5 0 - 5 RBC/hpf  WBC, UA 11-20 0 - 5 WBC/hpf   Bacteria, UA RARE (A) NONE SEEN   Squamous Epithelial / LPF 6-10 0 - 5   Mucus PRESENT     Comment: Performed at [facility], [place] [address] [place], [place] [address]   *Note: Due to a large number of results and/or encounters for the requested time period, some results have not been displayed. A complete set of results can be found in Results Review.   MR BRAIN W WO CONTRAST  Result Date: 06/22/2021 CLINICAL DATA:  Headache, febrile, HIV positive EXAM: MRI HEAD WITHOUT AND WITH CONTRAST TECHNIQUE: Multiplanar, multiecho pulse sequences of the brain and surrounding structures were obtained without and with intravenous contrast. CONTRAST:  43mL GADAVIST GADOBUTROL 1 MMOL/ML IV SOLN COMPARISON:  CT head 06/20/2021 FINDINGS: Brain: There is no evidence of acute intracranial hemorrhage, extra-axial fluid collection, or acute infarct. Parenchymal volume is normal. The ventricles are normal in size. There are scattered small foci of FLAIR signal abnormality in the subcortical and periventricular white matter, nonspecific but likely reflecting sequela of mild chronic white matter microangiopathy. There is no suspicious parenchymal signal abnormality. There is no mass lesion or abnormal enhancement. There is no midline shift. Vascular: Normal flow voids. Skull and upper cervical spine: Postsurgical changes are noted in the upper cervical spine. There is no suspicious marrow signal abnormality. Sinuses/Orbits: The  paranasal sinuses are clear. Elongation of the globes likely reflects sequela of myopia. The globes and orbits are otherwise unremarkable. Other: None. IMPRESSION: No acute intracranial pathology.  No abnormal enhancement. Electronically Signed   By: Valetta Mole M.D.   On: 06/22/2021 21:04   DG Chest Port 1 View  Result Date: 06/23/2021 CLINICAL DATA:  Questionable sepsis. EXAM: PORTABLE CHEST 1 VIEW COMPARISON:  None. FINDINGS: Normal mediastinum and cardiac silhouette. Normal pulmonary vasculature. No evidence of effusion, infiltrate, or pneumothorax. No acute bony abnormality. Anterior cervical fusion IMPRESSION: No acute cardiopulmonary process. Electronically Signed   By: Suzy Bouchard M.D.   On: 06/23/2021 07:47   DG FL GUIDED LUMBAR PUNCTURE  Result Date: 06/23/2021 CLINICAL DATA:  Provided history: Fever. EXAM: DIAGNOSTIC LUMBAR PUNCTURE UNDER FLUOROSCOPIC GUIDANCE COMPARISON:  Lumbar spine radiographs 02/01/2014. MRI of the lumbar spine 06/02/2013. FLUOROSCOPY TIME:  Fluoroscopy Time:  2 minutes, 48 seconds Radiation Exposure Index (if provided by the fluoroscopic device): 23.40 mGy Number of Acquired Spot Images: 2 PROCEDURE: Candiss Norse, PA obtained informed consent from the patient prior to the procedure, with a discussion of potential procedure risks, benefits and alternatives. The procedure was performed by Candiss Norse, PA. I was briefly called to the fluoroscopy suite for assistance. With the patient prone, the lower back was prepped with Betadine. 1% Lidocaine was used for local anesthesia. Lumbar puncture was performed at the L4-L5 level using a 20 gauge needle with return of clear CSF. 11 ml of CSF were obtained for laboratory studies. The patient tolerated the procedure well. No immediate post-procedure complication was apparent. IMPRESSION: Technically successful fluoroscopically-guided lumbar puncture at L4-L5. 11 mL of CSF obtained for laboratory studies.  Electronically Signed   By: Kellie Simmering D.O.   On: 06/23/2021 10:36    Pending Labs Unresulted Labs (From admission, onward)     Start     Ordered   06/24/21 0737  Basic metabolic panel  Tomorrow morning,   R        06/23/21 1522   06/24/21 0500  CBC  Tomorrow morning,   R  06/23/21 1522   06/23/21 1537  VZV PCR, CSF  Add-on,   AD        06/23/21 1536   06/23/21 1500  HSV 1/2 PCR, CSF Cerebrospinal Fluid  Add-on,   AD        06/23/21 1459   06/23/21 1013  [place] Ink CSF  RELEASE UPON ORDERING,   STAT        06/23/21 1013   06/23/21 1011  Pathologist smear review  Once,   R        06/23/21 1011   06/23/21 0626  Lactic acid, plasma  (Undifferentiated presentation (screening labs and basic nursing orders))  Now then every 2 hours,   STAT,   Status:  Canceled      06/23/21 0626   06/23/21 0626  Blood Culture (routine x 2)  (Undifferentiated presentation (screening labs and basic nursing orders))  BLOOD CULTURE X 2,   STAT      06/23/21 0626   06/23/21 0626  Urine Culture  (Undifferentiated presentation (screening labs and basic nursing orders))  ONCE - STAT,   STAT       Question:  Indication  Answer:  Sepsis   06/23/21 0626            Vitals/Pain Today's Vitals   06/23/21 1430 06/23/21 1500 06/23/21 1630 06/23/21 1640  BP: (!) 130/94 (!) 137/94 133/88   Pulse: (!) 102 95 100   Resp: (!) 25 (!) 22 19   Temp:   98.1 F (36.7 C)   TempSrc:      SpO2: 95% 96% 93%   Weight:      Height:      PainSc:    6     Isolation Precautions Droplet precaution  Medications Medications  HYDROcodone-acetaminophen (NORCO/VICODIN) 5-325 MG per tablet 1-2 tablet (has no administration in time range)  bictegravir-emtricitabine-tenofovir AF (BIKTARVY) 50-200-25 MG per tablet 1 tablet (has no administration in time range)  ALPRAZolam (XANAX) tablet 0.5 mg (has no administration in time range)  hydrOXYzine (ATARAX) tablet 25 mg (has no administration in time range)  pantoprazole  (PROTONIX) EC tablet 40 mg (has no administration in time range)  methocarbamol (ROBAXIN) tablet 500 mg (500 mg Oral Given 06/23/21 1644)  tiZANidine (ZANAFLEX) tablet 6 mg (6 mg Oral Given 06/23/21 1644)  albuterol (PROVENTIL) (2.5 MG/3ML) 0.083% nebulizer solution 2.5 mg (has no administration in time range)  fluticasone (FLONASE) 50 MCG/ACT nasal spray 2 spray (has no administration in time range)  Budeson-Glycopyrrol-Formoterol 160-9-4.8 MCG/ACT AERO 2 puff (has no administration in time range)  loratadine (CLARITIN) tablet 10 mg (has no administration in time range)  diphenhydrAMINE (BENADRYL) capsule 25 mg (has no administration in time range)  montelukast (SINGULAIR) tablet 10 mg (has no administration in time range)  enoxaparin (LOVENOX) injection 40 mg (has no administration in time range)  sodium chloride flush (NS) 0.9 % injection 3 mL (3 mLs Intravenous Not Given 06/23/21 1608)  lactated ringers infusion ( Intravenous New Bag/Given 06/23/21 1643)  docusate sodium (COLACE) capsule 100 mg (100 mg Oral Given 06/23/21 1644)  polyethylene glycol (MIRALAX / GLYCOLAX) packet 17 g (has no administration in time range)  bisacodyl (DULCOLAX) EC tablet 5 mg (has no administration in time range)  ondansetron (ZOFRAN) tablet 4 mg (has no administration in time range)    Or  ondansetron (ZOFRAN) injection 4 mg (has no administration in time range)  hydrALAZINE (APRESOLINE) injection 5 mg (has no administration in time range)  fentaNYL (SUBLIMAZE)  injection 12.5 mcg (has no administration in time range)  sodium chloride 0.9 % bolus 1,000 mL (0 mLs Intravenous Stopped 06/23/21 1028)  metoCLOPramide (REGLAN) injection 10 mg (10 mg Intravenous Given 06/23/21 0822)  diphenhydrAMINE (BENADRYL) injection 25 mg (25 mg Intravenous Given 06/23/21 0822)  lidocaine (PF) (XYLOCAINE) 1 % injection 10 mL ( Intradermal Canceled Entry 06/23/21 1043)  fentaNYL (SUBLIMAZE) injection 25 mcg (25 mcg Intravenous  Given 06/23/21 1115)  acyclovir (ZOVIRAX) 200 MG capsule 400 mg (400 mg Oral Given 06/23/21 1513)  HYDROmorphone (DILAUDID) injection 0.5 mg (0.5 mg Intravenous Given 06/23/21 1523)    Mobility walks Low fall risk   Focused Assessments Neuro Assessment Handoff:  Swallow screen pass? Yes  Cardiac Rhythm: Normal sinus rhythm, Sinus tachycardia       Neuro Assessment: Within Defined Limits Neuro Checks:      Last Documented NIHSS Modified Score:   Has TPA been given? No If patient is a Neuro Trauma and patient is going to OR before floor call report to [facility] nurse: [phone] or [phone]  , musculoskeletal    R Recommendations: See Admitting Provider Note  Report given to:   Additional Notes: none

## 2021-06-23 NOTE — Progress Notes (Signed)
Casey Brennan arrived to 25w30 at 11. Alert and oriented x4. Able to ambulate independently with no issues. Skin assessed with Lonn Georgia RN. Connected to telemetry monitor. Oriented to room, call light, and bed controls.

## 2021-06-23 NOTE — Assessment & Plan Note (Signed)
-  Most recent testing indicates normal CD4 and undetectable viral load -Continue Biktarvy -ID consult

## 2021-06-23 NOTE — Assessment & Plan Note (Signed)
-  Appears to be stable at this time -Will follow

## 2021-06-23 NOTE — ED Provider Notes (Signed)
[facility] EMERGENCY DEPARTMENT Provider Note   CSN: [id number] Arrival date & time: 06/23/21  0557     History Chief Complaint  Patient presents with   Emesis    Casey Brennan is a 60 y.o. male.  HPI Patient presents with his sister who helps with the history.  Patient has multiple medical issues including HIV, prior cervical spine fusion.  He presents today with concern for ongoing headache, neck pain, myalgia, fever.  He was seen and evaluated at his neurologist yesterday, and in the emergency department last week. There is associated nausea, vomiting.  Minimal relief with Zofran. Patient was scheduled for MRI yesterday, spinal tap today, per neurology notes.    Past Medical History:  Diagnosis Date   Arthritis    r knee    Asthma    very rare   Axillary lymphadenopathy    Bronchitis    Chronic anemia    normocytic   Chronic folliculitis    Elevated PSA    Environmental and seasonal allergies    GERD (gastroesophageal reflux disease)    Patient denies   Gross hematuria    H/O pericarditis    2010--  myopercarditis--  resolved   HCAP (healthcare-associated pneumonia) 11/19/2014   Headache(784.0)    HX SEVERE FRONTAL HA'S   Hiatal hernia    History of concussion    2012  &  2013  RESIDUAL HA'S --  RESOLVED   History of gastric ulcer    History of kidney stones    History of MRSA infection 2010   infected boil   HIV (human immunodeficiency virus infection) ([place]) 1988   Hypertension    Internal hemorrhoids    Lytic bone lesion of hip    WORK-UP DONE BY ONCOLOGIST DR HA --  NOT MALIGNANT   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   Pneumonia    hx of   Post concussion syndrome    resolved   Prostate cancer ([place]) 04/25/13   gleason 3+3=6, volume 30 gm   Sinusitis, chronic 05/14/2015   Ulcer    hx of gastric   Wears glasses     Patient Active Problem List   Diagnosis Date Noted   Febrile illness 06/22/2021   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 06/22/2021   Apnea present when patient supine 06/22/2021   Encephalopathy 06/22/2021   Action tremor 06/22/2021   Wheezing 04/09/2021   AKI (acute kidney injury) ([place]) 12/29/2020   Allergic rhinitis due to pollen 12/27/2020   Allergy to dairy product 12/27/2020   Angioneurotic edema 12/27/2020   Mild persistent asthma, uncomplicated 10/62/6948   Allergic rhinitis 12/27/2020   Vitamin D deficiency 12/27/2020   S/P cervical spinal fusion 11/11/2020   Nevus 11/11/2020   Myelopathy ([place]) 09/29/2020   Chronic obstructive pulmonary disease with acute exacerbation ([place]) 07/13/2020   Encounter for well adult exam with abnormal findings 06/23/2020   Elevated TSH 05/20/2020   Mixed hyperlipidemia 05/12/2020   Generalized abdominal pain 07/07/2019   Peripheral neuropathy 06/02/2019   Healthcare maintenance 01/08/2019   Myopia of both eyes with astigmatism and presbyopia 09/13/2018   Tension headache 11/12/2017   Hepatitis B immune 06/06/2017   Chronic pain of left wrist 06/06/2017   Constipation 09/27/2016   Chronic renal insufficiency, stage 2 (mild) 05/22/2016   Skin lesion of right lower extremity 02/22/2016   Migraines 02/22/2016   Low back pain  02/22/2016   Osteoarthritis of right knee 01/11/2016   Kidney stones 10/25/2015   Sinusitis, chronic 05/14/2015   Cough 01/18/2015   GERD (gastroesophageal reflux disease) 07/29/2014   COPD GOLD III 07/22/2014   Hx of primary hypertension 04/27/2014   H/O prostate cancer    Lytic bone lesion of hip 08/22/2012   Cataract, nuclear 05/15/2012   Nuclear sclerotic cataract of both eyes 05/15/2012   Asthma 01/23/2012   Insomnia 05/11/2011   Primary hypertension 04/17/2011   Mood disorder in conditions classified elsewhere 06/14/2009   Human immunodeficiency virus (HIV) disease ([place]) 04/19/2009    Past Surgical History:   Procedure Laterality Date   ACDCF  09/29/2020   ANTERIOR CERVICAL DECOMPRESSION/DISCECTOMY FUSION 4 LEVELS N/A 09/29/2020   Procedure: ANTERIOR CERVICAL DECOMPRESSION FUSION CERVICAL 3-CERVICAL 4 , CERVICAL 4 - CERVICAL 5, CERVICAL 5 - CERVICAL 6 WITH INSTRUMENTATION AND ALLOGRAFT;  Surgeon: Phylliss Bob, MD;  Location: [place];  Service: Orthopedics;  Laterality: N/A;   CARDIAC CATHETERIZATION  03-23-2009  DR COOPER   NORMAL CORONARY ARTERIES   CHOLECYSTECTOMY N/A 11/06/2014   Procedure: LAPAROSCOPIC CHOLECYSTECTOMY WITH INTRAOPERATIVE CHOLANGIOGRAM;  Surgeon: Judeth Horn, MD;  Location: [place];  Service: General;  Laterality: N/A;   COLONOSCOPY  12/26/2011   Procedure: COLONOSCOPY;  Surgeon: Lear Ng, MD;  Location: [facility];  Service: Endoscopy;  Laterality: N/A;   COLONOSCOPY     COLONOSCOPY WITH PROPOFOL N/A 07/29/2014   Procedure: COLONOSCOPY WITH PROPOFOL;  Surgeon: Lear Ng, MD;  Location: [place];  Service: Endoscopy;  Laterality: N/A;   CYSTOSCOPY N/A 05/18/2018   Procedure: CYSTOSCOPY REMOVAL FOREIGN BODY;  Surgeon: Kathie Rhodes, MD;  Location: [facility];  Service: Urology;  Laterality: N/A;   CYSTOSCOPY/RETROGRADE/URETEROSCOPY Bilateral 04/25/2013   Procedure: CYSTOSCOPY/ BILATERAL RETROGRADES; BLADDER BIOPSIES;  Surgeon: Alexis Frock, MD;  Location: [facility];  Service: Urology;  Laterality: Bilateral;   DENTAL EXAMINATION UNDER ANESTHESIA     ESOPHAGOGASTRODUODENOSCOPY  12/26/2011   Procedure: ESOPHAGOGASTRODUODENOSCOPY (EGD);  Surgeon: Lear Ng, MD;  Location: [facility];  Service: Endoscopy;  Laterality: N/A;   ESOPHAGOGASTRODUODENOSCOPY (EGD) WITH PROPOFOL N/A 07/29/2014   Procedure: ESOPHAGOGASTRODUODENOSCOPY (EGD) WITH PROPOFOL;  Surgeon: Lear Ng, MD;  Location: [place];  Service: Endoscopy;  Laterality: N/A;   EXCISION CHRONIC LEFT BREAST ABSCESS  09-21-2010   EXCISIONAL BX LEFT BREAST MASS/  I  &  D  LEFT BREAST ABSCESS  03-24-2009   KNEE ARTHROSCOPY Right 1985   left axilla biopsy  04/2013   left hip biopsy  10/2012   LYMPHADENECTOMY Bilateral 08/18/2013   Procedure: LYMPHADENECTOMY "PELVIC LYMPH NODE DISSECTION";  Surgeon: Alexis Frock, MD;  Location: [facility];  Service: Urology;  Laterality: Bilateral;   PROSTATE BIOPSY N/A 04/25/2013   Procedure: BIOPSY TRANSRECTAL ULTRASONIC PROSTATE (TUBP);  Surgeon: Alexis Frock, MD;  Location: [facility];  Service: Urology;  Laterality: N/A;   ROBOT ASSISTED LAPAROSCOPIC RADICAL PROSTATECTOMY N/A 08/18/2013   Procedure: ROBOTIC ASSISTED LAPAROSCOPIC RADICAL PROSTATECTOMY;  Surgeon: Alexis Frock, MD;  Location: [facility];  Service: Urology;  Laterality: N/A;   TOTAL KNEE ARTHROPLASTY Right 08/01/2016   08/17/16   TOTAL KNEE ARTHROPLASTY Right 08/01/2016   Procedure: TOTAL KNEE ARTHROPLASTY;  Surgeon: Renette Butters, MD;  Location: [place];  Service: Orthopedics;  Laterality: Right;   UPPER GASTROINTESTINAL ENDOSCOPY     UPPER LEG SOFT TISSUE BIOPSY Left 2012   lthigh       Family History  Problem Relation Age of  Onset   Stroke Father    Diabetes Father    Cancer Father        brain cancer   Asthma Father    Hypertension Sister    Cancer Maternal Uncle        prostate cancer   Asthma Sister    Colon cancer Neg Hx    Colon polyps Neg Hx    Esophageal cancer Neg Hx    Stomach cancer Neg Hx    Rectal cancer Neg Hx     Social History   Tobacco Use   Smoking status: Former    Packs/day: 1.50    Years: 23.00    Pack years: 34.50    Types: Cigars, Cigarettes    Quit date: 05/22/2010    Years since quitting: 11.0   Smokeless tobacco: Never  Vaping Use   Vaping Use: Never used  Substance Use Topics   Alcohol use: Not Currently    Alcohol/week: 0.0 standard drinks    Comment: 1-2 drinks a week    Drug use: No    Home Medications Prior to Admission medications   Medication Sig Start Date End Date Taking?  Authorizing Provider  albuterol (PROVENTIL) (2.5 MG/3ML) 0.083% nebulizer solution Take 3 mLs (2.5 mg total) by nebulization every 6 (six) hours as needed for wheezing or shortness of breath. 01/28/21  Yes Campbell Riches, MD  albuterol (VENTOLIN HFA) 108 (90 Base) MCG/ACT inhaler Inhale 2 puffs into the lungs every 6 (six) hours as needed for wheezing or shortness of breath. 01/28/21  Yes Campbell Riches, MD  ALPRAZolam (XANAX) 0.5 MG tablet TAKE 1 TABLET(0.5 MG) BY MOUTH TWICE DAILY AS NEEDED FOR SLEEP Patient taking differently: Take 0.5 mg by mouth 2 (two) times daily as needed for sleep. 03/21/21  Yes Ward Givens, NP  Beclomethasone Dipropionate (QNASL) 80 MCG/ACT AERS Place 2 sprays into the nose daily. 01/04/21  Yes Rigoberto Noel, MD  bictegravir-emtricitabine-tenofovir AF (BIKTARVY) 50-200-25 MG TABS tablet Take 1 tablet by mouth daily. 11/11/20  Yes Kuppelweiser, Cassie L, RPH-CPP  Budeson-Glycopyrrol-Formoterol (BREZTRI AEROSPHERE) 160-9-4.8 MCG/ACT AERO Inhale 2 puffs into the lungs in the morning and at bedtime. 01/04/21  Yes Rigoberto Noel, MD  cetirizine (ZYRTEC) 10 MG tablet Take 1 tablet (10 mg total) by mouth daily. 10/18/20 10/18/21 Yes Biagio Borg, MD  chlorpheniramine-HYDROcodone (TUSSIONEX) 10-8 MG/5ML SUER Take 5 mLs by mouth every 12 (twelve) hours as needed for congestion. 06/17/21 06/24/21 Yes [provider]  Cholecalciferol (VITAMIN D3) 250 MCG (10000 UT) capsule Take 1 tablet by mouth daily. [employer] 04/14/21  Yes [provider]  cyanocobalamin (,VITAMIN B-12,) 1000 MCG/ML injection Inject 1 mL (1,000 mcg total) into the muscle every 30 (thirty) days. 11/12/20  Yes Biagio Borg, MD  diphenhydrAMINE (BENADRYL) 25 MG tablet Take 1 tablet (25 mg total) by mouth every 6 (six) hours as needed. Patient taking differently: Take 25 mg by mouth every 6 (six) hours as needed for sleep, itching or allergies. 04/11/21  Yes Venter, Margaux, PA-C  gabapentin  (NEURONTIN) 300 MG capsule TAKE 1 CAPSULE(300 MG) BY MOUTH FOUR TIMES DAILY AS NEEDED FOR PAIN Patient taking differently: Take 300 mg by mouth 4 (four) times daily as needed (pain). 05/31/21  Yes Dohmeier, Asencion Partridge, MD  Glucos-Chond-Hyal Ac-Ca Fructo (MOVE FREE JOINT HEALTH ADVANCE PO) Take 1 tablet by mouth daily.   Yes [provider]  hydrALAZINE (APRESOLINE) 50 MG tablet Take 1 tablet (50 mg total) by mouth 3 (  three) times daily. 06/15/21 09/13/21 Yes Patwardhan, Reynold Bowen, MD  HYDROcodone-acetaminophen (NORCO/VICODIN) 5-325 MG tablet Take 1-2 tablets by mouth every 6 (six) hours as needed for moderate pain or severe pain. 06/21/21  Yes Charlesetta Shanks, MD  hydrOXYzine (ATARAX/VISTARIL) 25 MG tablet Take 1 tablet (25 mg total) by mouth at bedtime. 05/24/20  Yes Dohmeier, Asencion Partridge, MD  levofloxacin (LEVAQUIN) 750 MG tablet Take 750 mg by mouth daily. 06/17/21 06/24/21 Yes [provider]  methocarbamol (ROBAXIN) 500 MG tablet Take 500 mg by mouth 4 (four) times daily.   Yes [provider]  metoprolol succinate (TOPROL-XL) 100 MG 24 hr tablet 100 mg daily. 12/13/20  Yes [provider]  montelukast (SINGULAIR) 10 MG tablet Take 10 mg by mouth daily. 11/15/20  Yes [provider]  Multiple Vitamin (MULTIVITAMIN WITH MINERALS) TABS tablet Take 1 tablet by mouth daily after breakfast.    Yes [provider]  mupirocin ointment (BACTROBAN) 2 % APPLY EXTERNALLY TO THE AFFECTED AREA TWICE DAILY FOR 3 WEEKS, PLACE COTTON BETWEEN TOES TO ALLOW BETTER AERATION AS NEEDED Patient taking differently: Apply 1 application topically See admin instructions. APPLY EXTERNALLY TO THE AFFECTED AREA TWICE DAILY FOR 3 WEEKS, PLACE COTTON BETWEEN TOES TO ALLOW BETTER AERATION AS NEEDED 04/20/21  Yes Campbell Riches, MD  naproxen sodium (ALEVE) 220 MG tablet Take 220 mg by mouth daily.   Yes [provider]  omeprazole (PRILOSEC) 40 MG capsule Take 1 capsule (40 mg  total) by mouth 2 (two) times daily. 04/20/21  Yes Biagio Borg, MD  ondansetron (ZOFRAN-ODT) 4 MG disintegrating tablet Take 1 tablet (4 mg total) by mouth every 4 (four) hours as needed for nausea or vomiting. 06/21/21  Yes Charlesetta Shanks, MD  OVER THE COUNTER MEDICATION Take 2 capsules by mouth daily. Zhou: thyroid supplement   Yes [provider]  tizanidine (ZANAFLEX) 6 MG capsule Take 1 capsule (6 mg total) by mouth 3 (three) times daily. 06/15/20  Yes Dohmeier, Asencion Partridge, MD  torsemide (DEMADEX) 20 MG tablet Take 10 mg by mouth See admin instructions. Tuesday Thursday Saturday   Yes [provider]  Ubrogepant (UBRELVY) 50 MG TABS Take 1 tablet at the onset of migraine can repeat in 2 hours if needed. 03/17/21  Yes Ward Givens, NP  EPINEPHrine 0.3 mg/0.3 mL IJ SOAJ injection Inject 0.3 mg into the muscle as needed for anaphylaxis. 10/18/20   Biagio Borg, MD  EPINEPHrine 0.3 mg/0.3 mL IJ SOAJ injection Inject 0.3 mg into the muscle as needed for anaphylaxis. Patient not taking: Reported on 06/23/2021 04/11/21   Eustaquio Maize, PA-C  methylPREDNISolone (MEDROL DOSEPAK) 4 MG TBPK tablet See admin instructions. follow package directions Patient not taking: Reported on 06/23/2021 06/17/21   [provider]  zolpidem (AMBIEN) 5 MG tablet Take 1-2 tablets (5-10 mg total) by mouth at bedtime as needed for sleep. Patient not taking: Reported on 04/23/2019 12/21/16 05/19/19  Mignon Pine, DO    Allergies    Amoxapine, Chocolate, Descovy [emtricitabine-tenofovir af], Genvoya [elviteg-cobic-emtricit-tenofaf], Morphine and related, Oxycodone-acetaminophen, Penicillins, Sulfa antibiotics, Tomato, Vancomycin, Darunavir, Carvedilol, Flagyl [metronidazole], Milk-related compounds, Morphine, Penicillin g, Toradol [ketorolac tromethamine], Acetaminophen, and Tramadol  Review of Systems   Review of Systems  Constitutional:        Per HPI, otherwise negative  HENT:          Per HPI, otherwise negative  Respiratory:         Per HPI, otherwise negative  Cardiovascular:  Per HPI, otherwise negative  Gastrointestinal:  Positive for nausea and vomiting.  Endocrine:       Negative aside from HPI  Genitourinary:        Neg aside from HPI   Musculoskeletal:        Per HPI, otherwise negative  Skin: Negative.   Allergic/Immunologic: Positive for immunocompromised state.  Neurological:  Negative for syncope.   Physical Exam Updated Vital Signs BP (!) 133/91    Pulse 92    Temp 97.6 F (36.4 C) (Oral)    Resp 19    Ht 5\' 9"  (1.753 m)    Wt 94.8 kg    SpO2 97%    BMI 30.86 kg/m   Physical Exam Vitals and nursing note reviewed.  Constitutional:      General: He is not in acute distress.    Appearance: He is well-developed.  HENT:     Head: Normocephalic and atraumatic.  Eyes:     Conjunctiva/sclera: Conjunctivae normal.  Cardiovascular:     Rate and Rhythm: Normal rate and regular rhythm.  Pulmonary:     Effort: Pulmonary effort is normal. No respiratory distress.     Breath sounds: No stridor.  Abdominal:     General: There is no distension.  Skin:    General: Skin is warm and dry.  Neurological:     Mental Status: He is alert and oriented to person, place, and time.     Cranial Nerves: No cranial nerve deficit.     Motor: No weakness.     Coordination: Coordination normal.  Psychiatric:        Mood and Affect: Mood normal.    ED Results / Procedures / Treatments   Labs (all labs ordered are listed, but only abnormal results are displayed) Labs Reviewed  COMPREHENSIVE METABOLIC PANEL - Abnormal; Notable for the following components:      Result Value   Sodium 134 (*)    Glucose, Bld 123 (*)    Creatinine, Ser 1.37 (*)    Total Bilirubin 1.6 (*)    GFR, Estimated 60 (*)    All other components within normal limits  CBC WITH DIFFERENTIAL/PLATELET - Abnormal; Notable for the following components:   Platelets 139 (*)    All other  components within normal limits  LIPASE, BLOOD - Abnormal; Notable for the following components:   Lipase 54 (*)    All other components within normal limits  APTT - Abnormal; Notable for the following components:   aPTT 23 (*)    All other components within normal limits  I-STAT CHEM 8, ED - Abnormal; Notable for the following components:   Chloride 96 (*)    Creatinine, Ser 1.30 (*)    Glucose, Bld 110 (*)    Calcium, Ion 1.08 (*)    All other components within normal limits  RESP PANEL BY RT-PCR (FLU A&B, COVID) ARPGX2  CULTURE, BLOOD (ROUTINE X 2)  CULTURE, BLOOD (ROUTINE X 2)  URINE CULTURE  CSF CULTURE W GRAM STAIN  [place] INK CSF  LACTIC ACID, PLASMA  PROTIME-INR  LACTIC ACID, PLASMA  URINALYSIS, ROUTINE W REFLEX MICROSCOPIC  GLUCOSE, CSF  PROTEIN, CSF  CSF CELL COUNT WITH DIFFERENTIAL    EKG EKG Interpretation  Date/Time:  Thursday June 23 2021 07:03:21 EST Ventricular Rate:  97 PR Interval:  163 QRS Duration: 89 QT Interval:  375 QTC Calculation: 477 R Axis:   46 Text Interpretation: Sinus rhythm Borderline prolonged QT interval T wave abnormality  Abnormal ECG Confirmed by Carmin Muskrat [phone]) on 06/23/2021 7:05:43 AM  Radiology MR BRAIN W WO CONTRAST  Result Date: 06/22/2021 CLINICAL DATA:  Headache, febrile, HIV positive EXAM: MRI HEAD WITHOUT AND WITH CONTRAST TECHNIQUE: Multiplanar, multiecho pulse sequences of the brain and surrounding structures were obtained without and with intravenous contrast. CONTRAST:  52mL GADAVIST GADOBUTROL 1 MMOL/ML IV SOLN COMPARISON:  CT head 06/20/2021 FINDINGS: Brain: There is no evidence of acute intracranial hemorrhage, extra-axial fluid collection, or acute infarct. Parenchymal volume is normal. The ventricles are normal in size. There are scattered small foci of FLAIR signal abnormality in the subcortical and periventricular white matter, nonspecific but likely reflecting sequela of mild chronic white matter  microangiopathy. There is no suspicious parenchymal signal abnormality. There is no mass lesion or abnormal enhancement. There is no midline shift. Vascular: Normal flow voids. Skull and upper cervical spine: Postsurgical changes are noted in the upper cervical spine. There is no suspicious marrow signal abnormality. Sinuses/Orbits: The paranasal sinuses are clear. Elongation of the globes likely reflects sequela of myopia. The globes and orbits are otherwise unremarkable. Other: None. IMPRESSION: No acute intracranial pathology.  No abnormal enhancement. Electronically Signed   By: Valetta Mole M.D.   On: 06/22/2021 21:04   DG Chest Port 1 View  Result Date: 06/23/2021 CLINICAL DATA:  Questionable sepsis. EXAM: PORTABLE CHEST 1 VIEW COMPARISON:  None. FINDINGS: Normal mediastinum and cardiac silhouette. Normal pulmonary vasculature. No evidence of effusion, infiltrate, or pneumothorax. No acute bony abnormality. Anterior cervical fusion IMPRESSION: No acute cardiopulmonary process. Electronically Signed   By: Suzy Bouchard M.D.   On: 06/23/2021 07:47    Procedures Procedures   Medications Ordered in ED Medications  acyclovir (ZOVIRAX) 200 MG capsule 400 mg (has no administration in time range)  sodium chloride 0.9 % bolus 1,000 mL (0 mLs Intravenous Stopped 06/23/21 1028)  metoCLOPramide (REGLAN) injection 10 mg (10 mg Intravenous Given 06/23/21 0822)  diphenhydrAMINE (BENADRYL) injection 25 mg (25 mg Intravenous Given 06/23/21 0822)  lidocaine (PF) (XYLOCAINE) 1 % injection 10 mL ( Intradermal Canceled Entry 06/23/21 1043)  fentaNYL (SUBLIMAZE) injection 25 mcg (25 mcg Intravenous Given 06/23/21 1115)    ED Course  I have reviewed the triage vital signs and the nursing notes.  Pertinent labs & imaging results that were available during my care of the patient were reviewed by me and considered in my medical decision making (see chart for details).  After initial evaluation and chart  review with crew the patient scheduled for LP, efforts were made to facilitate this with our interventional radiology and radiology colleagues. 10:40 AM Patient's initial labs reassuring.  12:01 PM CBC, lactic acid level, chemistry panel all reassuring.  Patient has completed lumbar puncture, continues to complain of pain diffusely.  Fentanyl provided, after checking allergies.   2:03 PM Lumbar puncture results consistent with viral meningitis.  I discussed this with the patient and with our infectious disease team.  Patient will start on acyclovir, admitted for monitoring, management given his history of HIV.  Lab results as above generally reassuring, lower suspicion for bacteremia or sepsis.,  However, given the patient's immunocompromise state, admission indicated MDM Rules/Calculators/A&P MDM Number of Diagnoses or Management Options Fever: new, needed workup Meningitis: new, needed workup   Amount and/or Complexity of Data Reviewed Clinical lab tests: ordered and reviewed Tests in the radiology section of CPT: ordered and reviewed Tests in the medicine section of CPT: ordered and reviewed Discussion of test results with the performing  providers: yes Decide to obtain previous medical records or to obtain history from someone other than the patient: yes Obtain history from someone other than the patient: yes Review and summarize past medical records: yes Discuss the patient with other providers: yes Independent visualization of images, tracings, or specimens: yes  Risk of Complications, Morbidity, and/or Mortality Presenting problems: high Diagnostic procedures: high Management options: high  Critical Care Total time providing critical care: 30-74 minutes (35)  Patient Progress Patient progress: stable   Final Clinical Impression(s) / ED Diagnoses Final diagnoses:  Meningitis     Carmin Muskrat, MD 06/23/21 1404

## 2021-06-23 NOTE — Procedures (Signed)
Technically successful fluoro guided LP at L4-5 level   11 cc of clear CSF sent to lab for analysis.  No immediate post procedural complication.  Please see imaging section of Epic for full dictation.  Candiss Norse, PA-C

## 2021-06-23 NOTE — ED Notes (Signed)
Orders placed for IV team at this time

## 2021-06-23 NOTE — H&P (Signed)
History and Physical    Patient: Casey Brennan:[id number] DOB: 29-Dec-1960 DOA: 06/23/2021 DOS: the patient was seen and examined on 06/23/2021 PCP: Biagio Borg, MD  Patient coming from: Home - lives with sister; Donald Prose: Sister, [phone]  Chief Complaint: ? meningitis  HPI: Casey Brennan is a 60 y.o. male with medical history significant of HIV; HTN; and prostate CA presenting with meningitis, sent by PCP.  He was seen in the ER on 12/19 for fever, myalgias, headache.   He had a spinal fusion on 3/30 ([place]). 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   He had some left arm pain as well, dull and to the hand.  His L 2-4 fingers are numb.  He had a nerve block with Dr. Ron Agee and he felt improvement in headache until that worse off.  Headaches recurred and he felt like he was spinning.  Some green mucus and coughing up green sputum last Tuesday, fever to 101.5.  Fever waxed and waned.  He got dizzy and weak all over.  The spinning would pass quickly but kept happening and he almost fell.  He started vomiting and didn't have an appetite.  Home COVID test negative.  He went to UC on Thursday; he was again negative for COVID and also flu and was given a Z-pack, Levaquin, prednisone taper, and Tussionex.  He still didn't feel better, n/v with green emesis.  Headaches with sharp pain in his left head 3 days ago and ringing in his left ear.  He thought he had a migraine and took [place].  He went to the ER Monday - negative COVID, blood and urine cultures, head CT and A/P CT.  He was given pain and nausea medicine and told to f/u with nephrology.  Dilaudid worked great for his pain in the ER but he wasn't able to keep the Zofran down when he went home.  He saw Dr. Posey Pronto (nephrology) Tuesday and his BP crashed to 110/60 and "He was going into shock."  He had blood and bacteria in the urine and had urine culture.  They  saw Dr. Lynann Bologna and he ordered MRI head/neck and CT head/neck - thinking they might be able to ablate the nerve.  She also reached out to the neurologist (Dohmeier) and they saw her yesterday and she ordered MRI of the brain done last night and LP which was done today.  He got worse at 0500 this AM.  He just wasn't getting better.  Pain meds at home were working some.  He lost 10 pounds in 2 days.  His HR is up to 105 when he stands and his BP has been a bit high.  He has felt like he was having some palpitations.   ED Course:  Likely viral meningitis but has HIV.  ID will follow.  On Acyclovir while awaiting results.  Has pain, on Fentanyl.   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   Pneumonia    hx of   Post concussion syndrome    resolved   Prostate cancer ([place]) 04/25/2013   gleason 3+3=6, volume 30 gm   Ulcer    hx of gastric   Past Surgical History:  Procedure Laterality Date   ACDCF  09/29/2020   ANTERIOR CERVICAL DECOMPRESSION/DISCECTOMY FUSION 4 LEVELS N/A 09/29/2020   Procedure: ANTERIOR CERVICAL DECOMPRESSION FUSION CERVICAL 3-CERVICAL 4 , CERVICAL 4 - CERVICAL 5, CERVICAL 5 - CERVICAL 6 WITH INSTRUMENTATION AND ALLOGRAFT;  Surgeon: Phylliss Bob, MD;  Location: [place];  Service: Orthopedics;  Laterality: N/A;   CARDIAC CATHETERIZATION  03-23-2009  DR COOPER   NORMAL CORONARY ARTERIES   CHOLECYSTECTOMY N/A 11/06/2014    Procedure: LAPAROSCOPIC CHOLECYSTECTOMY WITH INTRAOPERATIVE CHOLANGIOGRAM;  Surgeon: Judeth Horn, MD;  Location: [place];  Service: General;  Laterality: N/A;   COLONOSCOPY  12/26/2011   Procedure: COLONOSCOPY;  Surgeon: Lear Ng, MD;  Location: [facility];  Service: Endoscopy;  Laterality: N/A;   COLONOSCOPY     COLONOSCOPY WITH PROPOFOL N/A 07/29/2014   Procedure: COLONOSCOPY WITH PROPOFOL;  Surgeon: Lear Ng, MD;  Location: [place];  Service: Endoscopy;  Laterality: N/A;   CYSTOSCOPY N/A 05/18/2018   Procedure: CYSTOSCOPY REMOVAL FOREIGN BODY;  Surgeon: Kathie Rhodes, MD;  Location: [facility];  Service: Urology;  Laterality: N/A;   CYSTOSCOPY/RETROGRADE/URETEROSCOPY Bilateral 04/25/2013   Procedure: CYSTOSCOPY/ BILATERAL RETROGRADES; BLADDER BIOPSIES;  Surgeon: Alexis Frock, MD;  Location: [facility];  Service: Urology;  Laterality: Bilateral;   DENTAL EXAMINATION UNDER ANESTHESIA     ESOPHAGOGASTRODUODENOSCOPY  12/26/2011   Procedure: ESOPHAGOGASTRODUODENOSCOPY (EGD);  Surgeon: Lear Ng, MD;  Location: [facility];  Service: Endoscopy;  Laterality: N/A;   ESOPHAGOGASTRODUODENOSCOPY (EGD) WITH PROPOFOL N/A 07/29/2014   Procedure: ESOPHAGOGASTRODUODENOSCOPY (EGD) WITH PROPOFOL;  Surgeon: Lear Ng, MD;  Location: [place];  Service: Endoscopy;  Laterality: N/A;   EXCISION CHRONIC LEFT BREAST ABSCESS  09-21-2010   EXCISIONAL BX LEFT BREAST MASS/  I  &  D LEFT BREAST ABSCESS  03-24-2009   KNEE ARTHROSCOPY Right 1985   left axilla biopsy  04/2013   left hip biopsy  10/2012   LYMPHADENECTOMY Bilateral 08/18/2013   Procedure: LYMPHADENECTOMY "PELVIC LYMPH NODE DISSECTION";  Surgeon: Alexis Frock, MD;  Location: [facility];  Service: Urology;  Laterality: Bilateral;   PROSTATE BIOPSY N/A 04/25/2013   Procedure: BIOPSY TRANSRECTAL ULTRASONIC PROSTATE (TUBP);  Surgeon: Alexis Frock, MD;  Location: [facility];  Service:  Urology;  Laterality: N/A;   ROBOT ASSISTED LAPAROSCOPIC RADICAL PROSTATECTOMY N/A 08/18/2013   Procedure: ROBOTIC ASSISTED LAPAROSCOPIC RADICAL PROSTATECTOMY;  Surgeon: Alexis Frock, MD;  Location: [facility];  Service: Urology;  Laterality: N/A;   TOTAL KNEE ARTHROPLASTY Right 08/01/2016   08/17/16   TOTAL KNEE ARTHROPLASTY Right 08/01/2016   Procedure: TOTAL KNEE ARTHROPLASTY;  Surgeon: Renette Butters, MD;  Location: [place];  Service: Orthopedics;  Laterality: Right;   UPPER GASTROINTESTINAL ENDOSCOPY     UPPER LEG SOFT TISSUE BIOPSY Left 2012   lthigh   Social History:  reports that he quit smoking about 11 years ago. His smoking use included cigars and cigarettes. He has a 34.50 pack-year smoking history. He  has never used smokeless tobacco. He reports that he does not currently use alcohol. He reports that he does not use drugs.  Allergies  Allergen Reactions   Amoxapine Shortness Of Breath   Chocolate Shortness Of Breath and Other (See Comments)    Asthma attack, welts   Descovy [Emtricitabine-Tenofovir Af] Shortness Of Breath    Nasuea, vomiiting, diarrhea, sob, whelps, throat closes   Genvoya [Elviteg-Cobic-Emtricit-Tenofaf] Shortness Of Breath    Nausea, vomitting, diarreha, sob, rash, throat closing   Morphine And Related Shortness Of Breath, Itching and Other (See Comments)     chest pain   Oxycodone-Acetaminophen Shortness Of Breath   Penicillins Shortness Of Breath and Rash    Has patient had a PCN reaction causing immediate rash, facial/tongue/throat swelling, SOB or lightheadedness with hypotension: Yes Has patient had a PCN reaction causing severe rash involving mucus membranes or skin necrosis: No Has patient had a PCN reaction that required hospitalization No Has patient had a PCN reaction occurring within the last 10 years: Yes If all of the above answers are "NO", then may proceed with Cephalosporin use.   Sulfa Antibiotics Anaphylaxis and Other (See Comments)    Tomato Shortness Of Breath   Vancomycin Shortness Of Breath and Other (See Comments)   Darunavir Itching   Carvedilol     LEG SWELLING, DIZZNESS   Flagyl [Metronidazole] Itching   Milk-Related Compounds    Morphine Other (See Comments)   Penicillin G Other (See Comments)   Toradol [Ketorolac Tromethamine] Nausea And Vomiting    itching   Acetaminophen Rash and Other (See Comments)    Pt tolerates percocet and also plain APAP without rash   Tramadol Itching, Nausea And Vomiting and Other (See Comments)    Family History  Problem Relation Age of Onset   Stroke Father    Diabetes Father    Cancer Father        brain cancer   Asthma Father    Hypertension Sister    Cancer Maternal Uncle        prostate cancer   Asthma Sister    Colon cancer Neg Hx    Colon polyps Neg Hx    Esophageal cancer Neg Hx    Stomach cancer Neg Hx    Rectal cancer Neg Hx     Prior to Admission medications   Medication Sig Start Date End Date Taking? Authorizing Provider  albuterol (PROVENTIL) (2.5 MG/3ML) 0.083% nebulizer solution Take 3 mLs (2.5 mg total) by nebulization every 6 (six) hours as needed for wheezing or shortness of breath. 01/28/21  Yes Campbell Riches, MD  albuterol (VENTOLIN HFA) 108 (90 Base) MCG/ACT inhaler Inhale 2 puffs into the lungs every 6 (six) hours as needed for wheezing or shortness of breath. 01/28/21  Yes Campbell Riches, MD  ALPRAZolam (XANAX) 0.5 MG tablet TAKE 1 TABLET(0.5 MG) BY MOUTH TWICE DAILY AS NEEDED FOR SLEEP Patient taking differently: Take 0.5 mg by mouth 2 (two) times daily as needed for sleep. 03/21/21  Yes Ward Givens, NP  Beclomethasone Dipropionate (QNASL) 80 MCG/ACT AERS Place 2 sprays into the nose daily. 01/04/21  Yes Rigoberto Noel, MD  bictegravir-emtricitabine-tenofovir AF (BIKTARVY) 50-200-25 MG TABS tablet Take 1 tablet by mouth daily. 11/11/20  Yes Kuppelweiser, Cassie L, RPH-CPP  Budeson-Glycopyrrol-Formoterol (BREZTRI AEROSPHERE) 160-9-4.8  MCG/ACT AERO Inhale 2 puffs into the lungs in the morning and at bedtime. 01/04/21  Yes Rigoberto Noel, MD  cetirizine (ZYRTEC) 10 MG tablet Take 1 tablet (10  mg total) by mouth daily. 10/18/20 10/18/21 Yes Biagio Borg, MD  chlorpheniramine-HYDROcodone (TUSSIONEX) 10-8 MG/5ML SUER Take 5 mLs by mouth every 12 (twelve) hours as needed for congestion. 06/17/21 06/24/21 Yes [provider]  Cholecalciferol (VITAMIN D3) 250 MCG (10000 UT) capsule Take 1 tablet by mouth daily. [employer] 04/14/21  Yes [provider]  cyanocobalamin (,VITAMIN B-12,) 1000 MCG/ML injection Inject 1 mL (1,000 mcg total) into the muscle every 30 (thirty) days. 11/12/20  Yes Biagio Borg, MD  diphenhydrAMINE (BENADRYL) 25 MG tablet Take 1 tablet (25 mg total) by mouth every 6 (six) hours as needed. Patient taking differently: Take 25 mg by mouth every 6 (six) hours as needed for sleep, itching or allergies. 04/11/21  Yes Venter, Margaux, PA-C  gabapentin (NEURONTIN) 300 MG capsule TAKE 1 CAPSULE(300 MG) BY MOUTH FOUR TIMES DAILY AS NEEDED FOR PAIN Patient taking differently: Take 300 mg by mouth 4 (four) times daily as needed (pain). 05/31/21  Yes Dohmeier, Asencion Partridge, MD  Glucos-Chond-Hyal Ac-Ca Fructo (MOVE FREE JOINT HEALTH ADVANCE PO) Take 1 tablet by mouth daily.   Yes [provider]  hydrALAZINE (APRESOLINE) 50 MG tablet Take 1 tablet (50 mg total) by mouth 3 (three) times daily. 06/15/21 09/13/21 Yes Patwardhan, Reynold Bowen, MD  HYDROcodone-acetaminophen (NORCO/VICODIN) 5-325 MG tablet Take 1-2 tablets by mouth every 6 (six) hours as needed for moderate pain or severe pain. 06/21/21  Yes Charlesetta Shanks, MD  hydrOXYzine (ATARAX/VISTARIL) 25 MG tablet Take 1 tablet (25 mg total) by mouth at bedtime. 05/24/20  Yes Dohmeier, Asencion Partridge, MD  levofloxacin (LEVAQUIN) 750 MG tablet Take 750 mg by mouth daily. 06/17/21 06/24/21 Yes [provider]  methocarbamol (ROBAXIN) 500 MG tablet Take 500 mg by  mouth 4 (four) times daily.   Yes [provider]  metoprolol succinate (TOPROL-XL) 100 MG 24 hr tablet 100 mg daily. 12/13/20  Yes [provider]  montelukast (SINGULAIR) 10 MG tablet Take 10 mg by mouth daily. 11/15/20  Yes [provider]  Multiple Vitamin (MULTIVITAMIN WITH MINERALS) TABS tablet Take 1 tablet by mouth daily after breakfast.    Yes [provider]  mupirocin ointment (BACTROBAN) 2 % APPLY EXTERNALLY TO THE AFFECTED AREA TWICE DAILY FOR 3 WEEKS, PLACE COTTON BETWEEN TOES TO ALLOW BETTER AERATION AS NEEDED Patient taking differently: Apply 1 application topically See admin instructions. APPLY EXTERNALLY TO THE AFFECTED AREA TWICE DAILY FOR 3 WEEKS, PLACE COTTON BETWEEN TOES TO ALLOW BETTER AERATION AS NEEDED 04/20/21  Yes Campbell Riches, MD  naproxen sodium (ALEVE) 220 MG tablet Take 220 mg by mouth daily.   Yes [provider]  omeprazole (PRILOSEC) 40 MG capsule Take 1 capsule (40 mg total) by mouth 2 (two) times daily. 04/20/21  Yes Biagio Borg, MD  ondansetron (ZOFRAN-ODT) 4 MG disintegrating tablet Take 1 tablet (4 mg total) by mouth every 4 (four) hours as needed for nausea or vomiting. 06/21/21  Yes Charlesetta Shanks, MD  OVER THE COUNTER MEDICATION Take 2 capsules by mouth daily. Zhou: thyroid supplement   Yes [provider]  tizanidine (ZANAFLEX) 6 MG capsule Take 1 capsule (6 mg total) by mouth 3 (three) times daily. 06/15/20  Yes Dohmeier, Asencion Partridge, MD  torsemide (DEMADEX) 20 MG tablet Take 10 mg by mouth See admin instructions. Tuesday Thursday Saturday   Yes [provider]  Ubrogepant (UBRELVY) 50 MG TABS Take 1 tablet at the onset of migraine can repeat in 2 hours if needed. 03/17/21  Yes  Ward Givens, NP  EPINEPHrine 0.3 mg/0.3 mL IJ SOAJ injection Inject 0.3 mg into the muscle as needed for anaphylaxis. 10/18/20   Biagio Borg, MD  EPINEPHrine 0.3 mg/0.3 mL IJ SOAJ injection Inject 0.3 mg into the  muscle as needed for anaphylaxis. Patient not taking: Reported on 06/23/2021 04/11/21   Eustaquio Maize, PA-C  methylPREDNISolone (MEDROL DOSEPAK) 4 MG TBPK tablet See admin instructions. follow package directions Patient not taking: Reported on 06/23/2021 06/17/21   [provider]  zolpidem (AMBIEN) 5 MG tablet Take 1-2 tablets (5-10 mg total) by mouth at bedtime as needed for sleep. Patient not taking: Reported on 04/23/2019 12/21/16 05/19/19  Mignon Pine, DO    Physical Exam: Vitals:   06/23/21 1430 06/23/21 1500 06/23/21 1630 06/23/21 1803  BP: (!) 130/94 (!) 137/94 133/88 (!) 130/105  Pulse: (!) 102 95 100 94  Resp: (!) 25 (!) 22 19 18   Temp:   98.1 F (36.7 C) 98 F (36.7 C)  TempSrc:    Axillary  SpO2: 95% 96% 93% 92%  Weight:      Height:       General:  Appears calm and comfortable and is in NAD Eyes:  PERRL, EOMI, normal lids, iris ENT:  grossly normal hearing, lips & tongue, mmm Neck:  no LAD, masses or thyromegaly Cardiovascular:  RRR, no m/r/g. No LE edema.  Respiratory:   CTA bilaterally with no wheezes/rales/rhonchi.  Normal to mildly increased respiratory effort. Abdomen:  soft, NT, ND Skin:  no rash or induration seen on limited exam Musculoskeletal:  grossly normal tone BUE/BLE, good ROM, no bony abnormality Psychiatric:  blunted mood and affect, speech fluent and appropriate, AOx3 Neurologic:  CN 2-12 grossly intact, moves all extremities in coordinated fashion  Pertinent Labs:   Glucose 123 BUN 13/Creatinine 1.37/GFR 60 - stable Unremarkable CBC Lactate 1.2 INR 1.0 COVID/flu negative UA: 20 ketones, trace LE, 100 protein CSF: 44 RBC, 93 WBC, 96% lymphs, protein 110; no orgs  Assessment/Plan * Viral meningitis, unspecified- (present on admission) -This is likely viral meningitis based on presentation of headache, dizziness and other vague symptoms, but low suspicion for bacterial meningitis. -Will also need to cover empirically for  aseptic meningitis with IV acyclovir; will try to maintain at least 3L IVF/day if the patient is taking IV acyclovir. -F/u HSV/VZV PCR (ordered by ID) -Appears to be immunocompetent  Mixed hyperlipidemia- (present on admission) -He does not appear to be taking medications for this issue at this time -Most recent LDL was 123 -He is likely to benefit from initiation of statin therapy  Chronic renal insufficiency, stage 2 (mild)- (present on admission) -Appears to be stable at this time -Will follow  COPD GOLD III- (present on admission) -Remote h/o smoking -Continue home meds: Singulair, Flonase, Breztri, prn Albuterol -Appears to be compensated at this time  H/O prostate cancer -Remote history -No current concerns  Primary hypertension- (present on admission) -Metoprolol and hydralazine have been on hold recently; will continue to hold for now -Add prn IV hydralazine  HIV infection ([place]) -Most recent testing indicates normal CD4 and undetectable viral load -Continue Biktarvy -ID consult    Advance Care Planning:   Code Status: Full Code   Consults: IR (for LP); ID  Family Communication: Sister was present throughout evaluation  Severity of Illness: The appropriate patient status for this patient is INPATIENT. Inpatient status is judged to be reasonable and necessary in order to provide the required intensity of service to ensure the  patient's safety. The patient's presenting symptoms, physical exam findings, and initial radiographic and laboratory data in the context of their chronic comorbidities is felt to place them at high risk for further clinical deterioration. Furthermore, it is not anticipated that the patient will be medically stable for discharge from the hospital within 2 midnights of admission.   * I certify that at the point of admission it is my clinical judgment that the patient will require inpatient hospital care spanning beyond 2 midnights from the point of  admission due to high intensity of service, high risk for further deterioration and high frequency of surveillance required.*  Author: Karmen Bongo 06/23/2021 6:35 PM  For on call review [email or web address].

## 2021-06-23 NOTE — ED Notes (Signed)
Pt now c/o spasms in left arm and leg

## 2021-06-23 NOTE — ED Notes (Signed)
In IR for LP

## 2021-06-23 NOTE — Progress Notes (Signed)
Pharmacy Antibiotic Note  Casey Brennan is a 60 y.o. 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  Pharmacy has been consulted for acyclovir dosing.  WBC 5.7, LA 1.2, afebrile. Scr 1.3 (CrCl 70 mL/min). CSF cx showing no orgs - PCR pending. ID following.   Plan: Order acyclovir 950 mg (10 mg/kg) every 8 hours Order NS@125  mL/hr  Monitor BMP q72 hr, cx results, clinical pic, and ID recs   Height: 5\' 9"  (175.3 cm) Weight: 94.8 kg (209 lb) IBW/kg (Calculated) : 70.7  Temp (24hrs), Avg:97.8 F (36.6 C), Min:97.6 F (36.4 C), Max:98.1 F (36.7 C)  Recent Labs  Lab 06/20/21 1839 06/23/21 0645 06/23/21 0738 06/23/21 0748  WBC 6.5 5.7  --   --   CREATININE 1.41* 1.37*  --  1.30*  LATICACIDVEN 1.0  --  1.2  --     Estimated Creatinine Clearance: 70.3 mL/min (A) (by C-G formula based on SCr of 1.3 mg/dL (H)).    Allergies  Allergen Reactions   Amoxapine Shortness Of Breath   Chocolate Shortness Of Breath and Other (See Comments)    Asthma attack, welts   Descovy [Emtricitabine-Tenofovir Af] Shortness Of Breath    Nasuea, vomiiting, diarrhea, sob, whelps, throat closes   Genvoya [Elviteg-Cobic-Emtricit-Tenofaf] Shortness Of Breath    Nausea, vomitting, diarreha, sob, rash, throat closing   Morphine And Related Shortness Of Breath, Itching and Other (See Comments)     chest pain   Oxycodone-Acetaminophen Shortness Of Breath   Penicillins Shortness Of Breath and Rash    Has patient had a PCN reaction causing immediate rash, facial/tongue/throat swelling, SOB or lightheadedness with hypotension: Yes Has patient had a PCN reaction causing severe rash involving mucus membranes or skin necrosis: No Has patient had a PCN reaction that required hospitalization No Has patient had a PCN reaction occurring within the last 10 years: Yes If all of the above answers are "NO", then may proceed with Cephalosporin use.   Sulfa Antibiotics Anaphylaxis and Other (See Comments)    Tomato Shortness Of Breath   Vancomycin Shortness Of Breath and Other (See Comments)   Darunavir Itching   Carvedilol     LEG SWELLING, DIZZNESS   Flagyl [Metronidazole] Itching   Milk-Related Compounds    Morphine Other (See Comments)   Penicillin G Other (See Comments)   Toradol [Ketorolac Tromethamine] Nausea And Vomiting    itching   Acetaminophen Rash and Other (See Comments)    Pt tolerates percocet and also plain APAP without rash   Tramadol Itching, Nausea And Vomiting and Other (See Comments)    Antimicrobials this admission: Acyclovir 12/22 >>   Dose adjustments this admission: N/A  Microbiology results: 12/22 VZV PCR Cx: sent 12/22 CSF Cx: no orgs seen  12/22 UCx: sent   12/22 COVID/Flu PCR: neg 12/22 Bcx: sent   Thank you for allowing pharmacy to be a part of this patients care.  Antonietta Jewel, PharmD, [place] Clinical Pharmacist  Phone: [phone] 06/23/2021 7:05 PM  Please check AMION for all [place] phone numbers After 10:00 PM, call [place] [phone]

## 2021-06-23 NOTE — Telephone Encounter (Signed)
-----   Message from Larey Seat, MD sent at 06/23/2021  3:14 PM EST ----- Normal MRI, CSF test will follow today. CD

## 2021-06-23 NOTE — Telephone Encounter (Signed)
Spoke with patient's sister and she states that patient is currently at the ER due to passing out, severe headache, ringing in ear, numbness in hand

## 2021-06-23 NOTE — ED Triage Notes (Signed)
Patient presents to ed via [facility] from home c/o vomiting onset Tues states he was seen by his PCP had MRI yest is scheduled for spinal tap today PCP thinks patient may have spinal fluid infection. Patient was given Zofran 4 mg IM per ems, Patient is actively vomiting at triage.

## 2021-06-23 NOTE — Consult Note (Signed)
[facility]    Date of Admission:  06/23/2021     Reason for Consult: meningitis    Referring Provider: Vanita Panda      Abx: 12/22-c acyclovir iv  Outpatient biktarvy        Assessment: Meningitis Hiv well controlled  Hx cervical spine fusion S/p turp for prostate cancer 2015  Patient with well controlled hiv and well reconstituted immune system here for several days none improving headache/meningismus after URI sx @ onset now confirmed aseptic meningitis process  Had cspine hardware/chronic bilateral UE peripheral neuropathy, but ct and clinical story not suggestive of cspine infection  Well reconstituted immune system with long term compliance of ART so very low suspicion for any OI cns process. Further more crypto ag negative  Brain mri normal but can be in early course of viral meningoencephalitis. Therefore prudent to start empiric acyclovir and send for hsv/vzv pcr. Consider other viral meningoencephalis as well but hsv/vzv has more implication and need r/o. VZV encephalitis process can occur in patient without the skin zoster component part. No evidence tick related illness. Low suspicion for arbovirus meningitides this time of year  Hiv well controlled. Lab Results  Component Value Date   HIV1RNAQUANT NOT DETECTED 11/11/2020   Lab Results  Component Value Date   CD4TCELL 31 (L) 11/11/2020   CD4TABS 659 11/11/2020    He had upper respiratory viral sx and given 7 day levo which he took 6 days prior to admission. Normal cx and no evidence pna so no need to continue taking  Of note, he also takes nsaids regularly which can cause aseptic meningitis process. If this is a recurrent issue and no evidence of hsv 2 malloret syndrome, would consider moving away from nsaids.  Plan: Start acyclovir hsv cns iv dosing No need for other empiric antimicrobial F/u hsv/vzv pcr (called lab to add on today 12/22) No need to finish levoflox  course Continue outpatient biktarvy      ------------------------------------------------     HPI: Casey Brennan is a 60 y.o. male well controlled hiv, hx prostate cancer s/p turp, cervical fusion, here with several days headache not improving  He started having uri sx a week prior to admission. Has nonpurulent phlegm and associated fever as well as body ache. Went to pcp and given levo which he had taken 6 days of. The uri sx are much improved besides the body ache. Headache/stiff neck also remains and moderate-severe and ongoing fever so went for evaluation  No rash/joint pain Constipated Decreased appetite Has right sided abd pain for the past few days as well  Visited his mother who had covid, admitted, about 2 weeks ago. No other sick contact Covid screen negative here this admission  Initial temp here 100.8 No leukocytosis Brain mri no active process Cxr clear Ct abd no bowel obstruction or sign of appendicitis Ct neck unremarkable I spoke with ed physician to start acyclovir   Asked lab to run hsv/vzv pcr. Csf normal glucose; mildly elevated protein; lymphocytic pleocytosis  Takes nsaids regularly for the past 4 years  Family History  Problem Relation Age of Onset   Stroke Father    Diabetes Father    Cancer Father        brain cancer   Asthma Father    Hypertension Sister    Cancer Maternal Uncle        prostate cancer   Asthma Sister    Colon  cancer Neg Hx    Colon polyps Neg Hx    Esophageal cancer Neg Hx    Stomach cancer Neg Hx    Rectal cancer Neg Hx     Social History   Tobacco Use   Smoking status: Former    Packs/day: 1.50    Years: 23.00    Pack years: 34.50    Types: Cigars, Cigarettes    Quit date: 05/22/2010    Years since quitting: 11.0   Smokeless tobacco: Never  Vaping Use   Vaping Use: Never used  Substance Use Topics   Alcohol use: Not Currently    Alcohol/week: 0.0 standard drinks    Comment: 1-2 drinks a week    Drug  use: No    Allergies  Allergen Reactions   Amoxapine Shortness Of Breath   Chocolate Shortness Of Breath and Other (See Comments)    Asthma attack, welts   Descovy [Emtricitabine-Tenofovir Af] Shortness Of Breath    Nasuea, vomiiting, diarrhea, sob, whelps, throat closes   Genvoya [Elviteg-Cobic-Emtricit-Tenofaf] Shortness Of Breath    Nausea, vomitting, diarreha, sob, rash, throat closing   Morphine And Related Shortness Of Breath, Itching and Other (See Comments)     chest pain   Oxycodone-Acetaminophen Shortness Of Breath   Penicillins Shortness Of Breath and Rash    Has patient had a PCN reaction causing immediate rash, facial/tongue/throat swelling, SOB or lightheadedness with hypotension: Yes Has patient had a PCN reaction causing severe rash involving mucus membranes or skin necrosis: No Has patient had a PCN reaction that required hospitalization No Has patient had a PCN reaction occurring within the last 10 years: Yes If all of the above answers are "NO", then may proceed with Cephalosporin use.   Sulfa Antibiotics Anaphylaxis and Other (See Comments)   Tomato Shortness Of Breath   Vancomycin Shortness Of Breath and Other (See Comments)   Darunavir Itching   Carvedilol     LEG SWELLING, DIZZNESS   Flagyl [Metronidazole] Itching   Milk-Related Compounds    Morphine Other (See Comments)   Penicillin G Other (See Comments)   Toradol [Ketorolac Tromethamine] Nausea And Vomiting    itching   Acetaminophen Rash and Other (See Comments)    Pt tolerates percocet and also plain APAP without rash   Tramadol Itching, Nausea And Vomiting and Other (See Comments)    Review of Systems: ROS All Other ROS was negative, except mentioned above   Past Medical History:  Diagnosis Date   Asthma    very rare   Chronic anemia    normocytic   Chronic folliculitis    Environmental and seasonal allergies    H/O pericarditis    2010--  myopercarditis--  resolved   Headache(784.0)     HX SEVERE FRONTAL HA'S   Hiatal hernia    History of gastric ulcer    History of kidney stones    History of MRSA infection 2010   infected boil   HIV (human immunodeficiency virus infection) ([place]) 1988   Hypertension    Lytic bone lesion of hip    WORK-UP DONE BY ONCOLOGIST DR HA --  NOT MALIGNANT   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   OBJECTIVE: Blood pressure (!) 130/94, pulse (!) 102, temperature 97.6 F (36.4 C), temperature source Oral, resp. rate (!) 25, height 5\' 9"  (1.753 m), weight 94.8 kg, SpO2 95 %.  Physical Exam  General/constitutional: no distress, pleasant HEENT: Normocephalic, PER, Conj Clear, EOMI, Oropharynx clear Neck supple CV: rrr no mrg Lungs: clear to auscultation, normal respiratory effort Abd: Soft, Nontender Ext: no edema Skin: No Rash Neuro: nonfocal; slight meningismus MSK: no peripheral joint swelling/tenderness/warmth; back spines nontender Psych alert/oriented   Lab Results Lab Results  Component Value Date   WBC 5.7 06/23/2021   HGB 16.0 06/23/2021   HCT 47.0 06/23/2021   MCV 92.8 06/23/2021   PLT 139 (L) 06/23/2021    Lab Results  Component Value Date   CREATININE 1.30 (H) 06/23/2021   BUN 16 06/23/2021   NA 137 06/23/2021   K 3.9 06/23/2021   CL 96 (L) 06/23/2021   CO2 25 06/23/2021    Lab Results  Component Value Date   ALT 20 06/23/2021   AST 23 06/23/2021   ALKPHOS 77 06/23/2021   BILITOT 1.6 (H) 06/23/2021      Microbiology: Recent Results (from the past 240 hour(s))  Blood Culture (routine x 2)     Status: None (Preliminary result)   Collection Time: 06/20/21  6:40 PM   Specimen: BLOOD  Result Value Ref Range Status   Specimen Description   Final    BLOOD  RIGHT ANTECUBITAL Performed at [facility], [place] [address] [place], [place] [address]    Special Requests   Final    BOTTLES DRAWN AEROBIC AND ANAEROBIC Blood Culture adequate volume Performed at [facility] [address] [place], [place] [address]    Culture   Final    NO GROWTH 3 DAYS Performed at [facility], [place] [address], [place], [place] [address]    Report Status PENDING  Incomplete  Resp Panel by RT-PCR (Flu A&B, Covid) Nasopharyngeal Swab     Status: None   Collection Time: 06/20/21  6:41 PM   Specimen: Nasopharyngeal Swab; Nasopharyngeal(NP) swabs in vial transport medium  Result Value Ref Range Status   SARS Coronavirus 2 by RT PCR NEGATIVE NEGATIVE Final    Comment: (NOTE) SARS-CoV-2 target nucleic acids are NOT DETECTED.  The SARS-CoV-2 RNA is generally detectable in upper respiratory specimens during the acute phase of infection. The lowest concentration of SARS-CoV-2 viral copies this assay can detect is 138 copies/mL. A negative result does not preclude SARS-Cov-2 infection and should not be used as the sole basis for treatment or other patient management decisions. A negative result may occur with  improper specimen collection/handling, submission of specimen other than nasopharyngeal swab, presence of viral mutation(s) within the areas targeted by this assay, and inadequate number of viral copies(<138 copies/mL). A negative result must be combined with clinical observations, patient history, and epidemiological information. The expected result is Negative.  Fact Sheet for Patients:  EntrepreneurPulse.com.au  Fact Sheet for Healthcare Providers:  IncredibleEmployment.be  This test is no t yet approved or cleared by the [place] FDA and  has been authorized for detection and/or diagnosis of SARS-CoV-2 by FDA under an Emergency Use Authorization (EUA). This EUA will  remain  in effect (meaning this test can be used) for the duration of the COVID-19 declaration under Section 564(b)(1) of the Act, 21 U.S.C.section 360bbb-3(b)(1), unless the authorization is terminated  or revoked sooner.  Influenza A by PCR NEGATIVE NEGATIVE Final   Influenza B by PCR NEGATIVE NEGATIVE Final    Comment: (NOTE) The Xpert Xpress SARS-CoV-2/FLU/RSV plus assay is intended as an aid in the diagnosis of influenza from Nasopharyngeal swab specimens and should not be used as a sole basis for treatment. Nasal washings and aspirates are unacceptable for Xpert Xpress SARS-CoV-2/FLU/RSV testing.  Fact Sheet for Patients: EntrepreneurPulse.com.au  Fact Sheet for Healthcare Providers: IncredibleEmployment.be  This test is not yet approved or cleared by the [place] FDA and has been authorized for detection and/or diagnosis of SARS-CoV-2 by FDA under an Emergency Use Authorization (EUA). This EUA will remain in effect (meaning this test can be used) for the duration of the COVID-19 declaration under Section 564(b)(1) of the Act, 21 U.S.C. section 360bbb-3(b)(1), unless the authorization is terminated or revoked.  Performed at [facility], [place] [address] [place], [place] [address]   Blood Culture (routine x 2)     Status: None (Preliminary result)   Collection Time: 06/20/21  7:38 PM   Specimen: BLOOD  Result Value Ref Range Status   Specimen Description   Final    BLOOD RIGHT ANTECUBITAL Performed at [facility] [address] [place], [place] [address]    Special Requests   Final    BOTTLES DRAWN AEROBIC ONLY Blood Culture results may not be optimal due to an inadequate volume of blood received in culture bottles Performed at [facility] [address], [place], [place] [address]    Culture   Final    NO GROWTH 2 DAYS Performed at [facility] [address] [place], [place] [address]    Report Status PENDING  Incomplete  Urine Culture     Status: None   Collection Time: 06/20/21  9:12 PM   Specimen: Urine, Clean Catch  Result Value Ref Range Status   Specimen Description   Final    URINE, CLEAN CATCH Performed at [facility], [place] [address], [place], [place] [address]    Special Requests   Final    NONE Performed at [facility], [place] [address] [place], [place] [address]    Culture   Final    NO GROWTH Performed at [facility], [place] [address] [place],  [address]    Report Status 06/22/2021 FINAL  Final  Resp Panel by RT-PCR (Flu A&B, Covid)     Status: None   Collection Time: 06/23/21  7:38 AM   Specimen: Nasopharyngeal(NP) swabs in vial transport medium  Result Value Ref Range Status   SARS Coronavirus 2 by RT PCR NEGATIVE NEGATIVE Final    Comment: (NOTE) SARS-CoV-2 target nucleic acids are NOT DETECTED.  The SARS-CoV-2 RNA is generally detectable in upper respiratory specimens during the acute phase of infection. The lowest concentration of SARS-CoV-2 viral copies this assay can detect is 138 copies/mL. A negative result does not preclude SARS-Cov-2 infection and should not be used as the sole basis for treatment or other patient management decisions. A negative result may occur with  improper specimen collection/handling, submission of specimen other than nasopharyngeal swab, presence of viral mutation(s) within the areas targeted by this assay, and inadequate number of viral copies(<138 copies/mL). A negative result must be combined with clinical observations, patient history, and epidemiological information. The expected result is Negative.  Fact Sheet for Patients:  EntrepreneurPulse.com.au  Fact Sheet for Healthcare Providers:  IncredibleEmployment.be  This test  is no t yet approved or cleared by the [place] and  has been authorized for detection and/or diagnosis of SARS-CoV-2 by FDA under an Emergency Use Authorization (EUA). This EUA will remain  in effect (meaning this test can be used) for the duration of the COVID-19 declaration under Section 564(b)(1) of the Act, 21 U.S.C.section 360bbb-3(b)(1), unless the authorization is terminated  or revoked sooner.       Influenza A by PCR NEGATIVE NEGATIVE Final   Influenza B by PCR NEGATIVE NEGATIVE Final    Comment: (NOTE) The Xpert Xpress SARS-CoV-2/FLU/RSV plus assay is intended as an aid in the diagnosis of influenza from Nasopharyngeal swab specimens and should not be used as a sole basis for treatment. Nasal washings and aspirates are unacceptable for Xpert Xpress SARS-CoV-2/FLU/RSV testing.  Fact Sheet for Patients: EntrepreneurPulse.com.au  Fact Sheet for Healthcare Providers: IncredibleEmployment.be  This test is not yet approved or cleared by the [place] FDA and has been authorized for detection and/or diagnosis of SARS-CoV-2 by FDA under an Emergency Use Authorization (EUA). This EUA will remain in effect (meaning this test can be used) for the duration of the COVID-19 declaration under Section 564(b)(1) of the Act, 21 U.S.C. section 360bbb-3(b)(1), unless the authorization is terminated or revoked.  Performed at [facility], [place] [address], [place], [place] [address]   CSF culture w Gram Stain     Status: None (Preliminary result)   Collection Time: 06/23/21 10:11 AM   Specimen: PATH Cytology CSF; Cerebrospinal Fluid  Result Value Ref Range Status   Specimen Description CSF  Final   Special Requests CYTO  Final   Gram Stain   Final    CYTOSPIN SMEAR WBC PRESENT, PREDOMINANTLY MONONUCLEAR NO ORGANISMS SEEN Performed at [facility], [place] [address], [place],  [address]    Culture PENDING  Incomplete   Report Status PENDING  Incomplete      Serology:    Imaging: If present, new imagings (plain films, ct scans, and mri) have been personally visualized and interpreted; radiology reports have been reviewed. Decision making incorporated into the Impression / Recommendations.  12/22 cxr No acute abnormality  12/21 brain mri No acute intracranial abnormality/enhancement  12/19 abd pelv ct 1. Underdistention of the rectum versus less likely mild proctitis. 2. Sigmoid diverticulosis. No bowel obstruction. Normal appendix. 3. Prostatectomy.   12/19 cspine and head ct CT of the head: No acute intracranial abnormality noted.   CT of the cervical spine: Postsurgical changes from C3-C6. No acute abnormality noted.  Jabier Mutton, [facility] [place] [phone] pager    06/23/2021, 3:00 PM

## 2021-06-23 NOTE — ED Notes (Signed)
Attempted PIV x 3 unsuccessful. Providers made aware. Spoke with lab as well

## 2021-06-23 NOTE — Assessment & Plan Note (Signed)
-  Remote h/o smoking -Continue home meds: Singulair, Flonase, Breztri, prn Albuterol -Appears to be compensated at this time

## 2021-06-23 NOTE — Progress Notes (Signed)
Normal MRI, CSF test will follow today. CD

## 2021-06-23 NOTE — Assessment & Plan Note (Signed)
-  Metoprolol and hydralazine have been on hold recently; will continue to hold for now -Add prn IV hydralazine

## 2021-06-24 DIAGNOSIS — A879 Viral meningitis, unspecified: Secondary | ICD-10-CM | POA: Diagnosis not present

## 2021-06-24 LAB — CBC
HCT: 35.6 % — ABNORMAL LOW (ref 39.0–52.0)
Hemoglobin: 12.2 g/dL — ABNORMAL LOW (ref 13.0–17.0)
MCH: 31.7 pg (ref 26.0–34.0)
MCHC: 34.3 g/dL (ref 30.0–36.0)
MCV: 92.5 fL (ref 80.0–100.0)
Platelets: 133 10*3/uL — ABNORMAL LOW (ref 150–400)
RBC: 3.85 MIL/uL — ABNORMAL LOW (ref 4.22–5.81)
RDW: 13.2 % (ref 11.5–15.5)
WBC: 4.9 10*3/uL (ref 4.0–10.5)
nRBC: 0 % (ref 0.0–0.2)

## 2021-06-24 LAB — BASIC METABOLIC PANEL
Anion gap: 5 (ref 5–15)
BUN: 10 mg/dL (ref 6–20)
CO2: 28 mmol/L (ref 22–32)
Calcium: 8.2 mg/dL — ABNORMAL LOW (ref 8.9–10.3)
Chloride: 101 mmol/L (ref 98–111)
Creatinine, Ser: 1.33 mg/dL — ABNORMAL HIGH (ref 0.61–1.24)
GFR, Estimated: >60 mL/min (ref >60)
Glucose, Bld: 98 mg/dL (ref 70–99)
Potassium: 3.7 mmol/L (ref 3.5–5.1)
Sodium: 134 mmol/L — ABNORMAL LOW (ref 135–145)

## 2021-06-24 LAB — PATHOLOGIST SMEAR REVIEW: Path Review: INCREASED

## 2021-06-24 LAB — C-REACTIVE PROTEIN: CRP: 0.6 mg/dL (ref <1.0)

## 2021-06-24 MED ORDER — IBUPROFEN 600 MG PO TABS: 600.0000 mg | ORAL_TABLET | Freq: Once | ORAL | Status: DC

## 2021-06-24 MED ORDER — FENTANYL CITRATE PF 50 MCG/ML IJ SOSY
25.0000 ug | PREFILLED_SYRINGE | INTRAMUSCULAR | Status: DC | PRN
  Administered 2021-06-24 – 2021-06-28 (×16): 25 ug via INTRAVENOUS
  Filled 2021-06-24 (×16): qty 1

## 2021-06-24 MED ORDER — ADULT MULTIVITAMIN W/MINERALS CH
1.0000 | ORAL_TABLET | Freq: Every day | ORAL | Status: DC
Start: 2021-06-24 — End: 2021-06-28
  Administered 2021-06-24 – 2021-06-27 (×4): 1 via ORAL
  Filled 2021-06-24 (×4): qty 1

## 2021-06-24 NOTE — Progress Notes (Signed)
[facility]  Date of Admission:  06/23/2021     Abx: 12/22-c acyclovir iv   Outpatient biktarvy                                                           Assessment: Meningitis Hiv well controlled  Hx cervical spine fusion S/p turp for prostate cancer 2015   Patient with well controlled hiv and well reconstituted immune system here for several days none improving headache/meningismus after URI sx @ onset now confirmed aseptic meningitis process   Had cspine hardware/chronic bilateral UE peripheral neuropathy, but ct and clinical story not suggestive of cspine infection   Well reconstituted immune system with long term compliance of ART so very low suspicion for any OI cns process. Further more crypto ag negative   Brain mri normal but can be in early course of viral meningoencephalitis. Therefore prudent to start empiric acyclovir and send for hsv/vzv pcr. Consider other viral meningoencephalis as well but hsv/vzv has more implication and need r/o. VZV encephalitis process can occur in patient without the skin zoster component part. No evidence tick related illness. Low suspicion for arbovirus meningitides this time of year   Hiv well controlled. Recent Labs       Lab Results  Component Value Date    HIV1RNAQUANT NOT DETECTED 11/11/2020      Recent Labs       Lab Results  Component Value Date    CD4TCELL 31 (L) 11/11/2020    CD4TABS 659 11/11/2020        He had upper respiratory viral sx and given 7 day levo which he took 6 days prior to admission. Normal cx and no evidence pna so no need to continue taking   Of note, he also takes nsaids regularly which can cause aseptic meningitis process. If this is a recurrent issue and no evidence of hsv 2 malloret syndrome, would consider moving away from nsaids.   ------------- 12/23 assessment Improved headache/body ache and overall feeling better.  Remains on only acyclovir No f/c   Pending hsv and vzv csf pcr. Csf cx ngtd as anticipated based on cell count    Plan: Continue empiric acyclovir If csf vzv/hsv pcr negative could stop acyclovir iv Keep well hydration while on iv acyclovir.  Continue outpatient biktarvy for his HIV Dr Baxter Flattery available for question this weekend  Principal Problem:   Viral meningitis, unspecified Active Problems:   HIV infection ([place])   Primary hypertension   H/O prostate cancer   COPD GOLD III   Chronic renal insufficiency, stage 2 (mild)   Mixed hyperlipidemia   Allergies  Allergen Reactions   Amoxapine Shortness Of Breath   Chocolate Shortness Of Breath and Other (See Comments)    Asthma attack, welts   Descovy [Emtricitabine-Tenofovir Af] Shortness Of Breath    Nasuea, vomiiting, diarrhea, sob, whelps, throat closes   Genvoya [Elviteg-Cobic-Emtricit-Tenofaf] Shortness Of Breath    Nausea, vomitting, diarreha, sob, rash, throat closing   Morphine And Related Shortness Of Breath, Itching and Other (See Comments)     chest pain   Oxycodone-Acetaminophen Shortness Of Breath   Penicillins Shortness Of Breath and Rash    Has patient had a PCN reaction causing immediate rash, facial/tongue/throat swelling, SOB  or lightheadedness with hypotension: Yes Has patient had a PCN reaction causing severe rash involving mucus membranes or skin necrosis: No Has patient had a PCN reaction that required hospitalization No Has patient had a PCN reaction occurring within the last 10 years: Yes If all of the above answers are "NO", then may proceed with Cephalosporin use.   Sulfa Antibiotics Anaphylaxis and Other (See Comments)   Tomato Shortness Of Breath   Vancomycin Shortness Of Breath and Other (See Comments)   Darunavir Itching   Carvedilol     LEG SWELLING, DIZZNESS   Flagyl [Metronidazole] Itching   Milk-Related Compounds    Morphine Other (See Comments)   Penicillin G Other (See Comments)   Toradol [Ketorolac Tromethamine]  Nausea And Vomiting    itching   Acetaminophen Rash and Other (See Comments)    Pt tolerates percocet and also plain APAP without rash   Tramadol Itching, Nausea And Vomiting and Other (See Comments)    Scheduled Meds:  bictegravir-emtricitabine-tenofovir AF  1 tablet Oral Daily   docusate sodium  100 mg Oral BID   enoxaparin (LOVENOX) injection  40 mg Subcutaneous Q24H   fluticasone  2 spray Each Nare Daily   fluticasone furoate-vilanterol  1 puff Inhalation Daily   hydrOXYzine  25 mg Oral QHS   loratadine  10 mg Oral Daily   methocarbamol  500 mg Oral QID   montelukast  10 mg Oral Daily   multivitamin with minerals  1 tablet Oral Daily   pantoprazole  40 mg Oral BID   sodium chloride flush  3 mL Intravenous Q12H   tiZANidine  6 mg Oral TID   umeclidinium bromide  1 puff Inhalation Daily   Continuous Infusions:  sodium chloride 125 mL/hr at 06/24/21 1234   acyclovir 950 mg (06/24/21 1243)   lactated ringers Stopped (06/23/21 2039)   PRN Meds:.albuterol, ALPRAZolam, bisacodyl, diphenhydrAMINE, fentaNYL (SUBLIMAZE) injection, hydrALAZINE, HYDROcodone-acetaminophen, ondansetron **OR** ondansetron (ZOFRAN) IV, polyethylene glycol   SUBJECTIVE: Feeling better No f/c No chest pain/cough Body ache/headache better   Review of Systems: ROS All other ROS was negative, except mentioned above     OBJECTIVE: Vitals:   06/24/21 0107 06/24/21 0737 06/24/21 0858 06/24/21 1154  BP: 110/70 94/67  113/72  Pulse: 96 97  90  Resp: 20 18  20   Temp: 98.3 F (36.8 C) 97.8 F (36.6 C)  98.3 F (36.8 C)  TempSrc: Axillary Oral  Oral  SpO2: 93% 95% 97% 99%  Weight:      Height:       Body mass index is 30.86 kg/m.  Physical Exam  General/constitutional: no distress, pleasant HEENT: Normocephalic, PER, Conj Clear, EOMI, Oropharynx clear Neck supple CV: rrr no mrg Lungs: clear to auscultation, normal respiratory effort Abd: Soft, Nontender Ext: no edema Skin: No  Rash Neuro: nonfocal    Lab Results Lab Results  Component Value Date   WBC 4.9 06/24/2021   HGB 12.2 (L) 06/24/2021   HCT 35.6 (L) 06/24/2021   MCV 92.5 06/24/2021   PLT 133 (L) 06/24/2021    Lab Results  Component Value Date   CREATININE 1.33 (H) 06/24/2021   BUN 10 06/24/2021   NA 134 (L) 06/24/2021   K 3.7 06/24/2021   CL 101 06/24/2021   CO2 28 06/24/2021    Lab Results  Component Value Date   ALT 20 06/23/2021   AST 23 06/23/2021   ALKPHOS 77 06/23/2021   BILITOT 1.6 (H) 06/23/2021  Microbiology: Recent Results (from the past 240 hour(s))  Blood Culture (routine x 2)     Status: None (Preliminary result)   Collection Time: 06/20/21  6:40 PM   Specimen: BLOOD  Result Value Ref Range Status   Specimen Description   Final    BLOOD RIGHT ANTECUBITAL Performed at [facility] [address] [place], [place] [address]    Special Requests   Final    BOTTLES DRAWN AEROBIC AND ANAEROBIC Blood Culture adequate volume Performed at [facility] [address], [place], [place] [address]    Culture   Final    NO GROWTH 4 DAYS Performed at [facility], [place] [address] [place], [place] [address]    Report Status PENDING  Incomplete  Resp Panel by RT-PCR (Flu A&B, Covid) Nasopharyngeal Swab     Status: None   Collection Time: 06/20/21  6:41 PM   Specimen: Nasopharyngeal Swab; Nasopharyngeal(NP) swabs in vial transport medium  Result Value Ref Range Status   SARS Coronavirus 2 by RT PCR NEGATIVE NEGATIVE Final    Comment: (NOTE) SARS-CoV-2 target nucleic acids are NOT DETECTED.  The SARS-CoV-2 RNA is generally detectable in upper respiratory specimens during the acute phase of infection. The lowest concentration of SARS-CoV-2 viral copies this assay can detect is 138 copies/mL. A negative result does not preclude SARS-Cov-2 infection and should not be used as the sole basis for treatment or other patient  management decisions. A negative result may occur with  improper specimen collection/handling, submission of specimen other than nasopharyngeal swab, presence of viral mutation(s) within the areas targeted by this assay, and inadequate number of viral copies(<138 copies/mL). A negative result must be combined with clinical observations, patient history, and epidemiological information. The expected result is Negative.  Fact Sheet for Patients:  EntrepreneurPulse.com.au  Fact Sheet for Healthcare Providers:  IncredibleEmployment.be  This test is no t yet approved or cleared by the [place] FDA and  has been authorized for detection and/or diagnosis of SARS-CoV-2 by FDA under an Emergency Use Authorization (EUA). This EUA will remain  in effect (meaning this test can be used) for the duration of the COVID-19 declaration under Section 564(b)(1) of the Act, 21 U.S.C.section 360bbb-3(b)(1), unless the authorization is terminated  or revoked sooner.       Influenza A by PCR NEGATIVE NEGATIVE Final   Influenza B by PCR NEGATIVE NEGATIVE Final    Comment: (NOTE) The Xpert Xpress SARS-CoV-2/FLU/RSV plus assay is intended as an aid in the diagnosis of influenza from Nasopharyngeal swab specimens and should not be used as a sole basis for treatment. Nasal washings and aspirates are unacceptable for Xpert Xpress SARS-CoV-2/FLU/RSV testing.  Fact Sheet for Patients: EntrepreneurPulse.com.au  Fact Sheet for Healthcare Providers: IncredibleEmployment.be  This test is not yet approved or cleared by the [place] FDA and has been authorized for detection and/or diagnosis of SARS-CoV-2 by FDA under an Emergency Use Authorization (EUA). This EUA will remain in effect (meaning this test can be used) for the duration of the COVID-19 declaration under Section 564(b)(1) of the Act, 21 U.S.C. section 360bbb-3(b)(1),  unless the authorization is terminated or revoked.  Performed at [facility], [place] [address], [place], [place] [address]   Blood Culture (routine x 2)     Status: None (Preliminary result)   Collection Time: 06/20/21  7:38 PM   Specimen: BLOOD  Result Value Ref Range Status   Specimen Description   Final  BLOOD RIGHT ANTECUBITAL Performed at [facility] [address], [place], [place] [address]    Special Requests   Final    BOTTLES DRAWN AEROBIC ONLY Blood Culture results may not be optimal due to an inadequate volume of blood received in culture bottles Performed at [facility] [address], [place], [place] [address]    Culture   Final    NO GROWTH 3 DAYS Performed at [facility], [place] [address], [place], [place] [address]    Report Status PENDING  Incomplete  Urine Culture     Status: None   Collection Time: 06/20/21  9:12 PM   Specimen: Urine, Clean Catch  Result Value Ref Range Status   Specimen Description   Final    URINE, CLEAN CATCH Performed at [facility], [place] [address] [place], [place] [address]    Special Requests   Final    NONE Performed at [facility], [place] [address], [place], [place] [address]    Culture   Final    NO GROWTH Performed at [facility], [place] [address] [place], [place] [address]    Report Status 06/22/2021 FINAL  Final  Blood Culture (routine x 2)     Status: None (Preliminary result)   Collection Time: 06/23/21  7:38 AM   Specimen: BLOOD LEFT FOREARM  Result Value Ref Range Status   Specimen Description BLOOD LEFT FOREARM  Final   Special Requests   Final    BOTTLES DRAWN AEROBIC AND ANAEROBIC Blood Culture results may not be optimal due to an inadequate volume of blood received in culture bottles   Culture   Final    NO GROWTH < 24 HOURS Performed at [facility], [place] [address], [place], [place] [address]     Report Status PENDING  Incomplete  Blood Culture (routine x 2)     Status: None (Preliminary result)   Collection Time: 06/23/21  7:38 AM   Specimen: BLOOD  Result Value Ref Range Status   Specimen Description BLOOD LEFT ANTECUBITAL  Final   Special Requests   Final    BOTTLES DRAWN AEROBIC AND ANAEROBIC Blood Culture adequate volume   Culture   Final    NO GROWTH < 24 HOURS Performed at [facility], [place] [address], [place],  [address]    Report Status PENDING  Incomplete  Resp Panel by RT-PCR (Flu A&B, Covid)     Status: None   Collection Time: 06/23/21  7:38 AM   Specimen: Nasopharyngeal(NP) swabs in vial transport medium  Result Value Ref Range Status   SARS Coronavirus 2 by RT PCR NEGATIVE NEGATIVE Final    Comment: (NOTE) SARS-CoV-2 target nucleic acids are NOT DETECTED.  The SARS-CoV-2 RNA is generally detectable in upper respiratory specimens during the acute phase of infection. The lowest concentration of SARS-CoV-2 viral copies this assay can detect is 138 copies/mL. A negative result does not preclude SARS-Cov-2 infection and should not be used as the sole basis for treatment or other patient management decisions. A negative result may occur with  improper specimen collection/handling, submission of specimen other than nasopharyngeal swab, presence of viral mutation(s) within the areas targeted by this assay, and inadequate number of viral copies(<138 copies/mL). A negative result must be combined with clinical observations, patient history, and epidemiological information. The expected result is Negative.  Fact Sheet for Patients:  EntrepreneurPulse.com.au  Fact Sheet for Healthcare Providers:  IncredibleEmployment.be  This test is no t yet approved or cleared by the [place] and  has been authorized for detection and/or diagnosis of SARS-CoV-2 by FDA under an Emergency Use Authorization (EUA). This EUA  will remain  in effect (meaning this test can be used) for the duration of the COVID-19 declaration under Section 564(b)(1) of the Act, 21 U.S.C.section 360bbb-3(b)(1), unless the authorization is terminated  or revoked sooner.       Influenza A by PCR NEGATIVE NEGATIVE Final   Influenza B by PCR NEGATIVE NEGATIVE Final    Comment: (NOTE) The Xpert Xpress SARS-CoV-2/FLU/RSV plus assay is intended as an aid in the diagnosis of influenza from Nasopharyngeal swab specimens and should not be used as a sole basis for treatment. Nasal washings and aspirates are unacceptable for Xpert Xpress SARS-CoV-2/FLU/RSV testing.  Fact Sheet for Patients: EntrepreneurPulse.com.au  Fact Sheet for Healthcare Providers: IncredibleEmployment.be  This test is not yet approved or cleared by the [place] FDA and has been authorized for detection and/or diagnosis of SARS-CoV-2 by FDA under an Emergency Use Authorization (EUA). This EUA will remain in effect (meaning this test can be used) for the duration of the COVID-19 declaration under Section 564(b)(1) of the Act, 21 U.S.C. section 360bbb-3(b)(1), unless the authorization is terminated or revoked.  Performed at [facility], [place] [address] [place], [place] [address]   CSF culture w Gram Stain     Status: None (Preliminary result)   Collection Time: 06/23/21 10:11 AM   Specimen: PATH Cytology CSF; Cerebrospinal Fluid  Result Value Ref Range Status   Specimen Description CSF  Final   Special Requests CYTO  Final   Gram Stain   Final    CYTOSPIN SMEAR WBC PRESENT, PREDOMINANTLY MONONUCLEAR NO ORGANISMS SEEN    Culture   Final    NO GROWTH < 24 HOURS Performed at [facility], [place] [address] [place], [place] [address]    Report Status PENDING  Incomplete  Urine Culture     Status: None (Preliminary result)   Collection Time: 06/23/21 11:17 AM   Specimen: In/Out Cath Urine  Result Value  Ref Range Status   Specimen Description IN/OUT CATH URINE  Final   Special Requests NONE  Final   Culture   Final    CULTURE REINCUBATED FOR BETTER GROWTH Performed at [facility], [place] [address] [name], [place] [address]    Report Status PENDING  Incomplete     Serology:   Imaging: If present, new imagings (plain films, ct scans, and mri) have been personally visualized and interpreted; radiology reports have been reviewed. Decision making incorporated into the Impression / Recommendations.  12/22 cxr No acute abnormality   12/21 brain mri No acute intracranial abnormality/enhancement   12/19 abd pelv ct 1. Underdistention of the rectum versus less likely mild proctitis. 2. Sigmoid diverticulosis. No bowel obstruction. Normal appendix. 3. Prostatectomy.   12/19 cspine and head ct CT of the head: No acute intracranial abnormality noted.   CT of the cervical spine: Postsurgical changes from C3-C6. No acute abnormality noted.    Jabier Mutton, [facility] for Infectious [place] [phone] pager    06/24/2021, 2:16 PM

## 2021-06-24 NOTE — Progress Notes (Signed)
Initial Nutrition Assessment  DOCUMENTATION CODES:   Severe malnutrition in context of acute illness/injury  INTERVENTION:  - Encourage PO intake  - As diet advances, recommend double protein and daily snacks to encourage adequate PO intake  - MVI daily  NUTRITION DIAGNOSIS:   Severe Malnutrition related to acute illness (viral meningitis) as evidenced by percent weight loss, energy intake < or equal to 50% for > or equal to 5 days, mild muscle depletion.  GOAL:   Patient will meet greater than or equal to 90% of their needs  MONITOR:   PO intake, Diet advancement, Labs, Weight trends  REASON FOR ASSESSMENT:   Malnutrition Screening Tool    ASSESSMENT:   Pt admitted from home with viral meningitis. PMH includes HIV, HTN, prostate cancer, s/p spinal fusion on 09/29/20.  Pt is a long-distance truck driver for work so he typically only eats 1 meal per day which may vary depending on what is available at his stops.  Within the past 1-2 weeks he has had poor PO intake, eating only soup, d/t nausea and vomiting which he reports has improved since admission. Spoke with pt about the importance of adequate nutrition for healing and prevention of muscle losses. Pt states that he is allergic to milk products which cause his throat to swell. He states that a couple months ago he was eating milk and Oreo cookies, although per review of chart pt noted to be eating chips and onion dip, when he had an allergic reaction.  Pt reports a usual body weight of 230 lbs and endorses weight loss within the past month, with a current weight of 208 lbs. Per review of chart, noted an 8% weight loss from 05/13/21-06/23/21.  Medications: biktarvy, colace, protonix  IV medications: acyclovir in D5  Labs: sodium 134, Cr 1.33, ionized calcium 1.08  NUTRITION - FOCUSED PHYSICAL EXAM:  Flowsheet Row Most Recent Value  Orbital Region No depletion  Upper Arm Region Mild depletion  Thoracic and Lumbar  Region No depletion  Buccal Region No depletion  Temple Region Mild depletion  Clavicle Bone Region Mild depletion  Clavicle and Acromion Bone Region Mild depletion  Scapular Bone Region Mild depletion  Dorsal Hand No depletion  Patellar Region No depletion  Anterior Thigh Region Mild depletion  Posterior Calf Region Mild depletion  Edema (RD Assessment) None  Hair Reviewed  Eyes Reviewed  Mouth Reviewed  Skin Reviewed  Nails Reviewed      Diet Order:   Diet Order             Diet full liquid Room service appropriate? Yes; Fluid consistency: Thin  Diet effective now                   EDUCATION NEEDS:   Education needs have been addressed  Skin:  Skin Assessment: Reviewed RN Assessment  Last BM:  unknown  Height:   Ht Readings from Last 1 Encounters:  06/23/21 5\' 9"  (1.753 m)    Weight:   Wt Readings from Last 1 Encounters:  06/23/21 94.8 kg    Ideal Body Weight:  72.7 kg  BMI:  Body mass index is 30.86 kg/m.  Estimated Nutritional Needs:   Kcal:  2200-2400  Protein:  100-115g  Fluid:  >/=2.2L  Clayborne Dana, RDN, LDN Clinical Nutrition

## 2021-06-24 NOTE — Evaluation (Signed)
Occupational Therapy Evaluation Patient Details Name: Casey Brennan MRN: [id number] DOB: [birth year] Today's Date: 06/24/2021   History of Present Illness 60 y.o. male with medical history significant of HIV; HTN; COPD; and prostate CA presenting with meningitis. Of note, he underwent ACDF March 2022.   Clinical Impression   Patient admitted for the diagnosis above.  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  Primary deficit is discomfort to his neck and back of his head, but otherwise he is at his baseline for in room mobility/toileting and ADL status.  No further OT needs in the acute setting, and no OT post acute.       Recommendations for follow up therapy are one component of a multi-disciplinary discharge planning process, led by the attending physician.  Recommendations may be updated based on patient status, additional functional criteria and insurance authorization.   Follow Up Recommendations  No OT follow up    Assistance Recommended at Discharge Set up Supervision/Assistance  Functional Status Assessment  Patient has not had a recent decline in their functional status  Equipment Recommendations  None recommended by OT    Recommendations for Other Services       Precautions / Restrictions Precautions Precautions: None Restrictions Weight Bearing Restrictions: No      Mobility Bed Mobility Overal bed mobility: Independent                  Transfers Overall transfer level: Independent Equipment used: None                      Balance Overall balance assessment: Mild deficits observed, not formally tested                                         ADL either performed or assessed with clinical judgement   ADL Overall ADL's : At baseline                                             Vision Baseline Vision/History: 1 Wears glasses Patient Visual  Report: No change from baseline       Perception Perception Perception: Not tested   Praxis Praxis Praxis: Not tested    Pertinent Vitals/Pain Pain Assessment: Faces Faces Pain Scale: Hurts little more Pain Location: posterior neck Pain Descriptors / Indicators: Discomfort;Sore;Tightness Pain Intervention(s): Patient requesting pain meds-RN notified;RN gave pain meds during session     Hand Dominance Right   Extremity/Trunk Assessment Upper Extremity Assessment Upper Extremity Assessment: Overall WFL for tasks assessed   Lower Extremity Assessment Lower Extremity Assessment: Overall WFL for tasks assessed   Cervical / Trunk Assessment Cervical / Trunk Assessment: Neck Surgery   Communication Communication Communication: No difficulties   Cognition Arousal/Alertness: Awake/alert Behavior During Therapy: Flat affect Overall Cognitive Status: Within Functional Limits for tasks assessed                                       General Comments  VSS on RA    Exercises     Shoulder Instructions      Home Living Family/patient expects  to be discharged to:: Private residence Living Arrangements: Other relatives;Other (Comment) (sister) Available Help at Discharge: Family;Available 24 hours/day Type of Home: House Home Access: Stairs to enter [employer] of Steps: 4 Entrance Stairs-Rails: Right;Left;Can reach both Home Layout: One level     Bathroom Shower/Tub: Tub/shower unit;Walk-in shower   Bathroom Toilet: Standard     Home Equipment: Conservation officer, nature (2 wheels);Cane - single point   Additional Comments: pt's sister lives with him and will help as needed      Prior Functioning/Environment Prior Level of Function : Independent/Modified Independent;Working/employed;Driving             Mobility Comments: works as a Administrator ADLs Comments: no assist        OT Problem List: Pain      OT Treatment/Interventions:       OT Goals(Current goals can be found in the care plan section) Acute Rehab OT Goals Patient Stated Goal: Return home OT Goal Formulation: With patient Time For Goal Achievement: 06/27/21 Potential to Achieve Goals: Good  OT Frequency:     Barriers to D/C:  None noted          Co-evaluation              AM-PAC OT "6 Clicks" Daily Activity     Outcome Measure Help from another person eating meals?: None Help from another person taking care of personal grooming?: None Help from another person toileting, which includes using toliet, bedpan, or urinal?: None Help from another person bathing (including washing, rinsing, drying)?: None Help from another person to put on and taking off regular upper body clothing?: None Help from another person to put on and taking off regular lower body clothing?: None 6 Click Score: 24   End of Session    Activity Tolerance: Patient tolerated treatment well Patient left: in bed;with call bell/phone within reach;with family/visitor present  OT Visit Diagnosis: Pain                Time: 9244-6286 OT Time Calculation (min): 21 min Charges:  OT General Charges $OT Visit: 1 Visit OT Evaluation $OT Eval Moderate Complexity: 1 Mod  06/24/2021  RP, OTR/L  Acute Rehabilitation Services  Office:  [phone]   Metta Clines 06/24/2021, 4:12 PM

## 2021-06-24 NOTE — Progress Notes (Signed)
PROGRESS NOTE                                                                                                                                                                                                             Patient Demographics:    Casey Brennan, is a 60 y.o. male, DOB - [birth year], ZRA:[id number]  Outpatient Primary MD for the patient is Biagio Borg, MD    LOS - 1  Admit date - 06/23/2021    Chief Complaint  Patient presents with   Emesis       Brief Narrative (HPI from H&P)   Casey Brennan is a 60 y.o. male with medical history significant of HIV; HTN; and prostate CA presenting with meningitis, sent by PCP.  He was seen in the ER on 12/19 for fever, myalgias, headache.   He had a spinal fusion on 3/30 ([facility]ki). 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   He had some left arm pain as well, dull and to the hand.  His L 2-4 fingers are numb.  He had a nerve block with Dr. Ron Agee and he felt improvement in headache until that worse off.  Headaches recurred and he felt like he was spinning.  Some green mucus and coughing up green sputum last Tuesday, fever to 101.5.  Fever waxed and waned, he finally presented to the ER where work-up including LP was suggestive of viral meningitis he was seen by ID and admitted to the hospital.   Subjective:    Casey Brennan today has, Mild headache, No chest pain, No abdominal pain - No Nausea, No new weakness tingling or numbness, No SOB   Assessment  & Plan :   Viral meningitis.  MRI brain nonacute, LP noted, follow-up on viral cultures and PCR, ID on board currently on IV acyclovir with IV fluids and continue.   2.  History of HIV.  In good control, ID following continue Biktarvy.  3.  History of spine surgery by Dr. Lynann Bologna.  Appears stable.  4.  Dyslipidemia.  Outpatient follow-up with  PCP.  5.  COPD.  Stable no acute issues supportive care.  6.  History of prostate cancer.  No acute issues.  7.  CKD 3A.  Baseline creatinine around 1.3.  Monitor.        Condition - Fair  Family Communication  :  none present  Code Status :  Full  Consults  :  IR, ID  PUD Prophylaxis : PPI   Procedures  :     MRI brain.  Nonacute.  CT scan abdomen pelvis outpatient done recently.  Questionable mild proctitis  LP.      Disposition Plan  :    Status is: Inpatient  Remains inpatient appropriate because: Meningitis  DVT Prophylaxis  :    enoxaparin (LOVENOX) injection 40 mg Start: 06/23/21 1800   Lab Results  Component Value Date   PLT 133 (L) 06/24/2021    Diet :  Diet Order             Diet clear liquid Room service appropriate? Yes; Fluid consistency: Thin  Diet effective now                    Inpatient Medications  Scheduled Meds:  bictegravir-emtricitabine-tenofovir AF  1 tablet Oral Daily   docusate sodium  100 mg Oral BID   enoxaparin (LOVENOX) injection  40 mg Subcutaneous Q24H   fluticasone  2 spray Each Nare Daily   fluticasone furoate-vilanterol  1 puff Inhalation Daily   hydrOXYzine  25 mg Oral QHS   loratadine  10 mg Oral Daily   methocarbamol  500 mg Oral QID   montelukast  10 mg Oral Daily   pantoprazole  40 mg Oral BID   sodium chloride flush  3 mL Intravenous Q12H   tiZANidine  6 mg Oral TID   umeclidinium bromide  1 puff Inhalation Daily   Continuous Infusions:  sodium chloride 125 mL/hr at 06/24/21 0432   acyclovir 950 mg (06/24/21 0431)   lactated ringers Stopped (06/23/21 2039)   PRN Meds:.albuterol, ALPRAZolam, bisacodyl, diphenhydrAMINE, fentaNYL (SUBLIMAZE) injection, hydrALAZINE, HYDROcodone-acetaminophen, ondansetron **OR** ondansetron (ZOFRAN) IV, polyethylene glycol  Antibiotics  :    Anti-infectives (From admission, onward)    Start     Dose/Rate Route Frequency Ordered Stop   06/24/21 1000   bictegravir-emtricitabine-tenofovir AF (BIKTARVY) 50-200-25 MG per tablet 1 tablet        1 tablet Oral Daily 06/23/21 1522     06/23/21 2000  acyclovir (ZOVIRAX) 950 mg in dextrose 5 % 150 mL IVPB        10 mg/kg  94.8 kg 169 mL/hr over 60 Minutes Intravenous Every 8 hours 06/23/21 1901     06/23/21 1430  acyclovir (ZOVIRAX) 200 MG capsule 400 mg        400 mg Oral  Once 06/23/21 1357 06/23/21 1513        Time Spent in minutes  30   [name] M.D on 06/24/2021 at 9:43 AM  To page go to [email or web address]   [facility] -  Office  [phone]  See all Orders from today for further details    Objective:   Vitals:   06/23/21 2012 06/24/21 0107 06/24/21 0737 06/24/21 0858  BP: 100/75 110/70 94/67   Pulse: 97 96 97   Resp: 20 20 18    Temp: 98.5 F (36.9 C) 98.3 F (36.8 C) 97.8 F (36.6 C)   TempSrc: Oral Axillary Oral   SpO2: 95% 93% 95% 97%  Weight:      Height:        Wt Readings from Last 3 Encounters:  06/23/21 94.8 kg  06/22/21 95.3 kg  06/20/21 95.3 kg  Intake/Output Summary (Last 24 hours) at 06/24/2021 0943 Last data filed at 06/24/2021 0432 Gross per 24 hour  Intake 1185.01 ml  Output 400 ml  Net 785.01 ml     Physical Exam  Awake Alert, No new F.N deficits, Normal affect [place].AT,PERRAL Supple Neck, No JVD,   Symmetrical Chest wall movement, Good air movement bilaterally, CTAB RRR,No Gallops,Rubs or new Murmurs,  +ve B.Sounds, Abd Soft, No tenderness,   No Cyanosis, Clubbing or edema       Data Review:    CBC Recent Labs  Lab 06/20/21 1839 06/23/21 0645 06/23/21 0748 06/24/21 0433  WBC 6.5 5.7  --  4.9  HGB 15.4 15.2 16.0 12.2*  HCT 45.9 44.9 47.0 35.6*  PLT 181 139*  --  133*  MCV 92.5 92.8  --  92.5  MCH 31.0 31.4  --  31.7  MCHC 33.6 33.9  --  34.3  RDW 13.7 13.2  --  13.2  LYMPHSABS 1.5 1.4  --   --   MONOABS 0.6 0.5  --   --   EOSABS 0.1 0.1  --   --   BASOSABS 0.0 0.0  --   --     Electrolytes Recent Labs   Lab 06/20/21 1839 06/23/21 0645 06/23/21 0738 06/23/21 0748 06/23/21 1813 06/24/21 0433 06/24/21 0815  NA 133* 134*  --  137  --  134*  --   K 3.6 3.5  --  3.9  --  3.7  --   CL 102 99  --  96*  --  101  --   CO2 24 25  --   --   --  28  --   GLUCOSE 99 123*  --  110*  --  98  --   BUN 15 13  --  16  --  10  --   CREATININE 1.41* 1.37*  --  1.30*  --  1.33*  --   CALCIUM 9.0 9.2  --   --   --  8.2*  --   AST 23 23  --   --   --   --   --   ALT 20 20  --   --   --   --   --   ALKPHOS 89 77  --   --   --   --   --   BILITOT 1.6* 1.6*  --   --   --   --   --   ALBUMIN 4.4 4.0  --   --   --   --   --   CRP  --   --   --   --   --   --  0.6  LATICACIDVEN 1.0  --  1.2  --  1.3  --   --   INR 1.1  --  1.0  --   --   --   --     ------------------------------------------------------------------------------------------------------------------ No results for input(s): CHOL, HDL, LDLCALC, TRIG, CHOLHDL, LDLDIRECT in the last 72 hours.  Lab Results  Component Value Date   HGBA1C (L) 10/10/2010    4.3 (NOTE)  According to the ADA Clinical Practice Recommendations for 2011, when HbA1c is used as a screening test:   >=6.5%   Diagnostic of Diabetes Mellitus           (if abnormal result  is confirmed)  5.7-6.4%   Increased risk of developing Diabetes Mellitus  References:Diagnosis and Classification of Diabetes Mellitus,Diabetes QJJH,4174,08(XKGYJ 1):S62-S69 and Standards of Medical Care in         Diabetes - 2011,Diabetes EHUD,1497,02  (Suppl 1):S11-S61.    No results for input(s): TSH, T4TOTAL, T3FREE, THYROIDAB in the last 72 hours.  Invalid input(s): FREET3 ------------------------------------------------------------------------------------------------------------------ ID Labs Recent Labs  Lab 06/20/21 1839 06/23/21 0645 06/23/21 0738 06/23/21 0748 06/23/21 1813 06/24/21 0433 06/24/21 0815  WBC 6.5 5.7  --    --   --  4.9  --   PLT 181 139*  --   --   --  133*  --   CRP  --   --   --   --   --   --  0.6  LATICACIDVEN 1.0  --  1.2  --  1.3  --   --   CREATININE 1.41* 1.37*  --  1.30*  --  1.33*  --    Cardiac Enzymes No results for input(s): CKMB, TROPONINI, MYOGLOBIN in the last 168 hours.  Invalid input(s): CK  Radiology Reports CT Head Wo Contrast  Result Date: 06/20/2021 CLINICAL DATA:  Headaches and neck pain, initial encounter EXAM: CT HEAD WITHOUT CONTRAST CT CERVICAL SPINE WITHOUT CONTRAST TECHNIQUE: Multidetector CT imaging of the head and cervical spine was performed following the standard protocol without intravenous contrast. Multiplanar CT image reconstructions of the cervical spine were also generated. COMPARISON:  None. FINDINGS: CT HEAD FINDINGS Brain: No evidence of acute infarction, hemorrhage, hydrocephalus, extra-axial collection or mass lesion/mass effect. Vascular: No hyperdense vessel or unexpected calcification. Skull: Normal. Negative for fracture or focal lesion. Sinuses/Orbits: No acute finding. Other: None. CT CERVICAL SPINE FINDINGS Alignment: Within normal limits. Skull base and vertebrae: Surgical changes are noted from C3-C6 with interbody fusion and anterior fixation. Multilevel osteophytic changes are seen. Facet hypertrophic changes are noted. No acute fracture or acute facet abnormality is noted. The odontoid is within normal limits. Soft tissues and spinal canal: Surrounding soft tissue structures are within normal limits. Upper chest: Visualized lung apices are within normal limits. Other: None IMPRESSION: CT of the head: No acute intracranial abnormality noted. CT of the cervical spine: Postsurgical changes from C3-C6. No acute abnormality noted. Electronically Signed   By: Inez Catalina M.D.   On: 06/20/2021 21:32   CT Cervical Spine Wo Contrast  Result Date: 06/20/2021 CLINICAL DATA:  Headaches and neck pain, initial encounter EXAM: CT HEAD WITHOUT CONTRAST CT  CERVICAL SPINE WITHOUT CONTRAST TECHNIQUE: Multidetector CT imaging of the head and cervical spine was performed following the standard protocol without intravenous contrast. Multiplanar CT image reconstructions of the cervical spine were also generated. COMPARISON:  None. FINDINGS: CT HEAD FINDINGS Brain: No evidence of acute infarction, hemorrhage, hydrocephalus, extra-axial collection or mass lesion/mass effect. Vascular: No hyperdense vessel or unexpected calcification. Skull: Normal. Negative for fracture or focal lesion. Sinuses/Orbits: No acute finding. Other: None. CT CERVICAL SPINE FINDINGS Alignment: Within normal limits. Skull base and vertebrae: Surgical changes are noted from C3-C6 with interbody fusion and anterior fixation. Multilevel osteophytic changes are seen. Facet hypertrophic changes are noted. No acute fracture or acute facet abnormality is noted. The odontoid is within normal limits. Soft tissues and spinal canal:  Surrounding soft tissue structures are within normal limits. Upper chest: Visualized lung apices are within normal limits. Other: None IMPRESSION: CT of the head: No acute intracranial abnormality noted. CT of the cervical spine: Postsurgical changes from C3-C6. No acute abnormality noted. Electronically Signed   By: Inez Catalina M.D.   On: 06/20/2021 21:32   MR BRAIN W WO CONTRAST  Result Date: 06/22/2021 CLINICAL DATA:  Headache, febrile, HIV positive EXAM: MRI HEAD WITHOUT AND WITH CONTRAST TECHNIQUE: Multiplanar, multiecho pulse sequences of the brain and surrounding structures were obtained without and with intravenous contrast. CONTRAST:  43mL GADAVIST GADOBUTROL 1 MMOL/ML IV SOLN COMPARISON:  CT head 06/20/2021 FINDINGS: Brain: There is no evidence of acute intracranial hemorrhage, extra-axial fluid collection, or acute infarct. Parenchymal volume is normal. The ventricles are normal in size. There are scattered small foci of FLAIR signal abnormality in the subcortical  and periventricular white matter, nonspecific but likely reflecting sequela of mild chronic white matter microangiopathy. There is no suspicious parenchymal signal abnormality. There is no mass lesion or abnormal enhancement. There is no midline shift. Vascular: Normal flow voids. Skull and upper cervical spine: Postsurgical changes are noted in the upper cervical spine. There is no suspicious marrow signal abnormality. Sinuses/Orbits: The paranasal sinuses are clear. Elongation of the globes likely reflects sequela of myopia. The globes and orbits are otherwise unremarkable. Other: None. IMPRESSION: No acute intracranial pathology.  No abnormal enhancement. Electronically Signed   By: Valetta Mole M.D.   On: 06/22/2021 21:04   CT Abdomen Pelvis W Contrast  Result Date: 06/20/2021 CLINICAL DATA:  Flank pain.  Concern for kidney stone. EXAM: CT ABDOMEN AND PELVIS WITH CONTRAST TECHNIQUE: Multidetector CT imaging of the abdomen and pelvis was performed using the standard protocol following bolus administration of intravenous contrast. CONTRAST:  25mL OMNIPAQUE IOHEXOL 350 MG/ML SOLN COMPARISON:  CT of the abdomen pelvis dated 02/07/2021. FINDINGS: Lower chest: The visualized lung bases are clear. No intra-abdominal free air or free fluid. Hepatobiliary: The liver is unremarkable. No intrahepatic biliary dilatation. Cholecystectomy. Pancreas: Unremarkable. No pancreatic ductal dilatation or surrounding inflammatory changes. Spleen: Normal in size without focal abnormality. Adrenals/Urinary Tract: There is a 2 cm indeterminate right adrenal nodule, previously characterized as adenoma. The left adrenal glands unremarkable. There is no hydronephrosis on either side. There is symmetric enhancement and excretion of contrast by both kidneys. Bilateral renal cysts measure up to 4 cm in the upper pole of the right kidney. Several additional subcentimeter hypodense lesions are too small to characterize. There is no  hydronephrosis on either side. There is symmetric enhancement and excretion of contrast by both kidneys. The visualized ureters and urinary bladder appear unremarkable. Stomach/Bowel: Mild thickened appearance of the rectum neck be related to underdistention. Clinical correlation is recommended to evaluate for possibility of mild proctitis. There is sigmoid diverticulosis without active inflammatory changes. There is no bowel obstruction or active inflammation. The appendix is normal. Vascular/Lymphatic: The abdominal aorta and IVC are unremarkable. No portal venous gas. There is no adenopathy. Reproductive: Prostatectomy. Other: Small fat containing umbilical and several additional small fat containing midline ventral hernia. No bowel herniation. Musculoskeletal: No acute or significant osseous findings. IMPRESSION: 1. Underdistention of the rectum versus less likely mild proctitis. 2. Sigmoid diverticulosis. No bowel obstruction. Normal appendix. 3. Prostatectomy. Electronically Signed   By: Anner Crete M.D.   On: 06/20/2021 21:35   DG Chest Port 1 View  Result Date: 06/23/2021 CLINICAL DATA:  Questionable sepsis. EXAM: PORTABLE CHEST 1 VIEW  COMPARISON:  None. FINDINGS: Normal mediastinum and cardiac silhouette. Normal pulmonary vasculature. No evidence of effusion, infiltrate, or pneumothorax. No acute bony abnormality. Anterior cervical fusion IMPRESSION: No acute cardiopulmonary process. Electronically Signed   By: Suzy Bouchard M.D.   On: 06/23/2021 07:47   DG Chest Port 1 View  Result Date: 06/20/2021 CLINICAL DATA:  Questionable sepsis. EXAM: PORTABLE CHEST 1 VIEW COMPARISON:  Chest x-ray 04/05/2021. FINDINGS: The heart size and mediastinal contours are within normal limits. Both lungs are clear. The visualized skeletal structures are unremarkable. IMPRESSION: No active disease. Electronically Signed   By: Ronney Asters M.D.   On: 06/20/2021 19:17   DG FL GUIDED LUMBAR PUNCTURE  Result  Date: 06/23/2021 CLINICAL DATA:  Provided history: Fever. EXAM: DIAGNOSTIC LUMBAR PUNCTURE UNDER FLUOROSCOPIC GUIDANCE COMPARISON:  Lumbar spine radiographs 02/01/2014. MRI of the lumbar spine 06/02/2013. FLUOROSCOPY TIME:  Fluoroscopy Time:  2 minutes, 48 seconds Radiation Exposure Index (if provided by the fluoroscopic device): 23.40 mGy Number of Acquired Spot Images: 2 PROCEDURE: Candiss Norse, PA obtained informed consent from the patient prior to the procedure, with a discussion of potential procedure risks, benefits and alternatives. The procedure was performed by Candiss Norse, PA. I was briefly called to the fluoroscopy suite for assistance. With the patient prone, the lower back was prepped with Betadine. 1% Lidocaine was used for local anesthesia. Lumbar puncture was performed at the L4-L5 level using a 20 gauge needle with return of clear CSF. 11 ml of CSF were obtained for laboratory studies. The patient tolerated the procedure well. No immediate post-procedure complication was apparent. IMPRESSION: Technically successful fluoroscopically-guided lumbar puncture at L4-L5. 11 mL of CSF obtained for laboratory studies. Electronically Signed   By: Kellie Simmering D.O.   On: 06/23/2021 10:36

## 2021-06-24 NOTE — Evaluation (Addendum)
Physical Therapy Evaluation Patient Details Name: Casey Brennan MRN: [id number] DOB: Jul 01, 1961 Today's Date: 06/24/2021  History of Present Illness  60 y.o. male with medical history significant of HIV; HTN; COPD; and prostate CA presenting with meningitis. Of note, he underwent ACDF March 2022.   Clinical Impression  Pt admitted with above diagnosis. 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 Pt currently with functional limitations due to the deficits listed below (see PT Problem List). On eval, pt demonstrated independence with bed mobility and transfers. Supervision provided for in room ambulation. Pt declining hallway ambulation due to feeling lightheaded. BP 129/74. Pt will benefit from skilled PT to increase their independence and safety with mobility to allow discharge home. PT to follow acutely to further assess hallway ambulation and stairs. No follow up services or DME indicated.          Recommendations for follow up therapy are one component of a multi-disciplinary discharge planning process, led by the attending physician.  Recommendations may be updated based on patient status, additional functional criteria and insurance authorization.  Follow Up Recommendations No PT follow up    Assistance Recommended at Discharge Intermittent Supervision/Assistance  Functional Status Assessment Patient has had a recent decline in their functional status and demonstrates the ability to make significant improvements in function in a reasonable and predictable amount of time.   Equipment Recommendations  None recommended by PT    Recommendations for Other Services       Precautions / Restrictions Precautions Precautions: None      Mobility  Bed Mobility Overal bed mobility: Independent                  Transfers Overall transfer level: Independent Equipment used: None                    Ambulation/Gait Ambulation/Gait assistance: Supervision Gait  Distance (Feet): 50 Feet Assistive device: None Gait Pattern/deviations: Step-through pattern;Decreased stride length Gait velocity: decreased Gait velocity interpretation: 1.31 - 2.62 ft/sec, indicative of limited community ambulator   General Gait Details: supervision for safety, declining hallway ambulation due to feeling lightheaded. BP 129/74  Stairs            Wheelchair Mobility    Modified Rankin (Stroke Patients Only)       Balance Overall balance assessment: Mild deficits observed, not formally tested                                           Pertinent Vitals/Pain Pain Assessment: Faces Faces Pain Scale: Hurts little more Pain Location: posterior neck Pain Descriptors / Indicators: Discomfort;Sore;Tightness Pain Intervention(s): Monitored during session;Repositioned    Home Living Family/patient expects to be discharged to:: Private residence Living Arrangements: Other relatives (sister) Available Help at Discharge: Family;Available 24 hours/day Type of Home: House Home Access: Stairs to enter Entrance Stairs-Rails: Right;Left;Can reach both Entrance Stairs-Number of Steps: 4   Home Layout: One level Home Equipment: Conservation officer, nature (2 wheels);Cane - single point Additional Comments: pt's sister lives with him and will help as needed    Prior Function Prior Level of Function : Independent/Modified Independent;Working/employed;Driving             Mobility Comments: works as a Presenter, broadcasting: Right    Extremity/Trunk Assessment   Upper Extremity Assessment  Upper Extremity Assessment: Overall WFL for tasks assessed    Lower Extremity Assessment Lower Extremity Assessment: Overall WFL for tasks assessed    Cervical / Trunk Assessment Cervical / Trunk Assessment: Neck Surgery (ACDF March 2022)  Communication   Communication: No difficulties  Cognition Arousal/Alertness:  Awake/alert Behavior During Therapy: Flat affect Overall Cognitive Status: Within Functional Limits for tasks assessed                                          General Comments General comments (skin integrity, edema, etc.): VSS on RA    Exercises     Assessment/Plan    PT Assessment Patient needs continued PT services  PT Problem List Decreased mobility;Decreased activity tolerance;Pain       PT Treatment Interventions Therapeutic activities;Gait training;Therapeutic exercise;Patient/family education;Stair training;Balance training;Functional mobility training    PT Goals (Current goals can be found in the Care Plan section)  Acute Rehab PT Goals Patient Stated Goal: home PT Goal Formulation: With patient Time For Goal Achievement: 07/01/21 Potential to Achieve Goals: Good    Frequency Min 3X/week   Barriers to discharge        Co-evaluation               AM-PAC PT "6 Clicks" Mobility  Outcome Measure Help needed turning from your back to your side while in a flat bed without using bedrails?: None Help needed moving from lying on your back to sitting on the side of a flat bed without using bedrails?: None Help needed moving to and from a bed to a chair (including a wheelchair)?: None Help needed standing up from a chair using your arms (e.g., wheelchair or bedside chair)?: None Help needed to walk in hospital room?: A Little Help needed climbing 3-5 steps with a railing? : A Little 6 Click Score: 22    End of Session   Activity Tolerance: Patient tolerated treatment well Patient left: in bed;with call bell/phone within reach Nurse Communication: Mobility status PT Visit Diagnosis: Difficulty in walking, not elsewhere classified (R26.2)    Time: 1205-1221 PT Time Calculation (min) (ACUTE ONLY): 16 min   Charges:   PT Evaluation $PT Eval Low Complexity: 1 Low          [name], PT  Office # [phone] Pager  [phone]   Casey Brennan 06/24/2021, 12:31 PM

## 2021-06-24 NOTE — Progress Notes (Signed)
°  Transition of Care [facility]) Screening Note   Patient Details  Name: Casey Brennan Date of Birth: Nov 20, 1960   Transition of Care [facility]) CM/SW Contact:    Benard Halsted, LCSW Phone Number: 06/24/2021, 8:54 AM    Transition of Care Department [facility]) has reviewed patient and no TOC needs have been identified at this time. We will continue to monitor patient advancement through interdisciplinary progression rounds. If new patient transition needs arise, please place a TOC consult.

## 2021-06-25 ENCOUNTER — Inpatient Hospital Stay (HOSPITAL_COMMUNITY): Payer: Medicare Other

## 2021-06-25 LAB — URINALYSIS, ROUTINE W REFLEX MICROSCOPIC
Bilirubin Urine: NEGATIVE
Glucose, UA: NEGATIVE mg/dL
Hgb urine dipstick: NEGATIVE
Ketones, ur: NEGATIVE mg/dL
Leukocytes,Ua: NEGATIVE
Nitrite: NEGATIVE
Protein, ur: NEGATIVE mg/dL
Specific Gravity, Urine: 1.01 (ref 1.005–1.030)
pH: 6.5 (ref 5.0–8.0)

## 2021-06-25 LAB — CBC WITH DIFFERENTIAL/PLATELET
Abs Immature Granulocytes: 0 10*3/uL (ref 0.00–0.07)
Basophils Absolute: 0 10*3/uL (ref 0.0–0.1)
Basophils Relative: 1 %
Eosinophils Absolute: 0.1 10*3/uL (ref 0.0–0.5)
Eosinophils Relative: 4 %
HCT: 35.3 % — ABNORMAL LOW (ref 39.0–52.0)
Hemoglobin: 11.8 g/dL — ABNORMAL LOW (ref 13.0–17.0)
Immature Granulocytes: 0 %
Lymphocytes Relative: 28 %
Lymphs Abs: 1.1 10*3/uL (ref 0.7–4.0)
MCH: 31 pg (ref 26.0–34.0)
MCHC: 33.4 g/dL (ref 30.0–36.0)
MCV: 92.7 fL (ref 80.0–100.0)
Monocytes Absolute: 0.4 10*3/uL (ref 0.1–1.0)
Monocytes Relative: 10 %
Neutro Abs: 2.2 10*3/uL (ref 1.7–7.7)
Neutrophils Relative %: 57 %
Platelets: 127 10*3/uL — ABNORMAL LOW (ref 150–400)
RBC: 3.81 MIL/uL — ABNORMAL LOW (ref 4.22–5.81)
RDW: 13.2 % (ref 11.5–15.5)
WBC: 3.9 10*3/uL — ABNORMAL LOW (ref 4.0–10.5)
nRBC: 0 % (ref 0.0–0.2)

## 2021-06-25 LAB — COMPREHENSIVE METABOLIC PANEL
ALT: 15 U/L (ref 0–44)
AST: 19 U/L (ref 15–41)
Albumin: 3 g/dL — ABNORMAL LOW (ref 3.5–5.0)
Alkaline Phosphatase: 65 U/L (ref 38–126)
Anion gap: 5 (ref 5–15)
BUN: 6 mg/dL (ref 6–20)
CO2: 26 mmol/L (ref 22–32)
Calcium: 8.4 mg/dL — ABNORMAL LOW (ref 8.9–10.3)
Chloride: 104 mmol/L (ref 98–111)
Creatinine, Ser: 1.39 mg/dL — ABNORMAL HIGH (ref 0.61–1.24)
GFR, Estimated: 59 mL/min — ABNORMAL LOW (ref >60)
Glucose, Bld: 105 mg/dL — ABNORMAL HIGH (ref 70–99)
Potassium: 3.8 mmol/L (ref 3.5–5.1)
Sodium: 135 mmol/L (ref 135–145)
Total Bilirubin: 0.8 mg/dL (ref 0.3–1.2)
Total Protein: 5.5 g/dL — ABNORMAL LOW (ref 6.5–8.1)

## 2021-06-25 LAB — OSMOLALITY, URINE: Osmolality, Ur: 300 mOsm/kg (ref 300–900)

## 2021-06-25 LAB — URINE CULTURE: Culture: 60000 — AB

## 2021-06-25 LAB — BRAIN NATRIURETIC PEPTIDE: B Natriuretic Peptide: 15.5 pg/mL (ref 0.0–100.0)

## 2021-06-25 LAB — C-REACTIVE PROTEIN: CRP: 0.6 mg/dL (ref <1.0)

## 2021-06-25 LAB — CULTURE, BLOOD (ROUTINE X 2)
Culture: NO GROWTH
Special Requests: ADEQUATE

## 2021-06-25 LAB — MAGNESIUM: Magnesium: 2 mg/dL (ref 1.7–2.4)

## 2021-06-25 LAB — SODIUM, URINE, RANDOM: Sodium, Ur: 96 mmol/L

## 2021-06-25 LAB — CREATININE, URINE, RANDOM: Creatinine, Urine: 85.84 mg/dL

## 2021-06-25 MED ORDER — SODIUM CHLORIDE 0.9 % IV SOLN: INTRAVENOUS | Status: AC

## 2021-06-25 NOTE — Progress Notes (Addendum)
PROGRESS NOTE                                                                                                                                                                                                             Patient Demographics:    Casey Brennan, is a 60 y.o. male, DOB - 1960-12-24, IDH:[id number]  Outpatient Primary MD for the patient is Biagio Borg, MD    LOS - 2  Admit date - 06/23/2021    Chief Complaint  Patient presents with   Emesis       Brief Narrative (HPI from H&P)   Casey Brennan is a 60 y.o. male with medical history significant of HIV; HTN; and prostate CA presenting with meningitis, sent by PCP.  He was seen in the ER on 12/19 for fever, myalgias, headache.   He had a spinal fusion on 3/30 (D[place]onski). 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   He had some left arm pain as well, dull and to the hand.  His L 2-4 fingers are numb.  He had a nerve block with Dr. Ron Agee and he felt improvement in headache until that worse off.  Headaches recurred and he felt like he was spinning.  Some green mucus and coughing up green sputum last Tuesday, fever to 101.5.  Fever waxed and waned, he finally presented to the ER where work-up including LP was suggestive of viral meningitis he was seen by ID and admitted to the hospital.   Subjective:   Patient in bed, appears comfortable, improved headache, no fever, no chest pain or pressure, no shortness of breath , no abdominal pain. No new focal weakness.    Assessment  & Plan :   Viral meningitis.  MRI brain nonacute, LP noted, follow-up on viral cultures and PCR, ID on board currently on IV acyclovir with IV fluids and continue.   2.  History of HIV.  In good control, ID following continue Biktarvy.  3.  History of spine surgery by Dr. Lynann Bologna.  Appears stable.  4.   Dyslipidemia.  Outpatient follow-up with PCP.  5.  COPD.  Stable no acute issues supportive care.  6.  History of prostate cancer.  No acute issues.  7.  CKD 3A.  Baseline creatinine around 1.3.  Monitor, check UA - Renal US.        Condition - Fair  Family Communication  :  Sister April [phone] on 06/24/21, 06/25/21  Code Status :  Full  Consults  :  IR, ID  PUD Prophylaxis : PPI   Procedures  :     MRI brain.  Nonacute.  CT scan abdomen pelvis outpatient done recently.  Questionable mild proctitis  LP.      Disposition Plan  :    Status is: Inpatient  Remains inpatient appropriate because: Meningitis  DVT Prophylaxis  :    enoxaparin (LOVENOX) injection 40 mg Start: 06/23/21 1800   Lab Results  Component Value Date   PLT 127 (L) 06/25/2021    Diet :  Diet Order             Diet full liquid Room service appropriate? Yes; Fluid consistency: Thin  Diet effective now                    Inpatient Medications  Scheduled Meds:  bictegravir-emtricitabine-tenofovir AF  1 tablet Oral Daily   docusate sodium  100 mg Oral BID   enoxaparin (LOVENOX) injection  40 mg Subcutaneous Q24H   fluticasone  2 spray Each Nare Daily   fluticasone furoate-vilanterol  1 puff Inhalation Daily   hydrOXYzine  25 mg Oral QHS   loratadine  10 mg Oral Daily   methocarbamol  500 mg Oral QID   montelukast  10 mg Oral Daily   multivitamin with minerals  1 tablet Oral Daily   pantoprazole  40 mg Oral BID   sodium chloride flush  3 mL Intravenous Q12H   tiZANidine  6 mg Oral TID   umeclidinium bromide  1 puff Inhalation Daily   Continuous Infusions:  sodium chloride 125 mL/hr at 06/25/21 0509   acyclovir 950 mg (06/25/21 0510)   lactated ringers Stopped (06/23/21 2039)   PRN Meds:.albuterol, ALPRAZolam, bisacodyl, diphenhydrAMINE, fentaNYL (SUBLIMAZE) injection, hydrALAZINE, HYDROcodone-acetaminophen, ondansetron **OR** ondansetron (ZOFRAN) IV, polyethylene  glycol  Antibiotics  :    Anti-infectives (From admission, onward)    Start     Dose/Rate Route Frequency Ordered Stop   06/24/21 1000  bictegravir-emtricitabine-tenofovir AF (BIKTARVY) 50-200-25 MG per tablet 1 tablet        1 tablet Oral Daily 06/23/21 1522     06/23/21 2000  acyclovir (ZOVIRAX) 950 mg in dextrose 5 % 150 mL IVPB        10 mg/kg  94.8 kg 169 mL/hr over 60 Minutes Intravenous Every 8 hours 06/23/21 1901     06/23/21 1430  acyclovir (ZOVIRAX) 200 MG capsule 400 mg        400 mg Oral  Once 06/23/21 1357 06/23/21 1513        Time Spent in minutes  30   [name] M.D on 06/25/2021 at 11:21 AM  To page go to [email or web address]   [facility] -  Office  [phone]  See all Orders from today for further details    Objective:   Vitals:   06/25/21 0003 06/25/21 0329 06/25/21 0747 06/25/21 0815  BP: (!) 87/69 132/74 130/84   Pulse: 94 86 100   Resp: 18 18 18    Temp: 97.8 F (36.6 C) 98.3 F (36.8 C) 99 F (37.2 C)   TempSrc: Axillary Oral Oral   SpO2: 90% 91% 92% 94%  Weight:      Height:  Wt Readings from Last 3 Encounters:  06/23/21 94.8 kg  06/22/21 95.3 kg  06/20/21 95.3 kg     Intake/Output Summary (Last 24 hours) at 06/25/2021 1121 Last data filed at 06/25/2021 1012 Gross per 24 hour  Intake 1980 ml  Output 950 ml  Net 1030 ml     Physical Exam  Awake Alert, No new F.N deficits, Normal affect [place].AT,PERRAL Supple Neck, No JVD,   Symmetrical Chest wall movement, Good air movement bilaterally, CTAB RRR,No Gallops, Rubs or new Murmurs,  +ve B.Sounds, Abd Soft, No tenderness,   No Cyanosis, Clubbing or edema        Data Review:    CBC Recent Labs  Lab 06/20/21 1839 06/23/21 0645 06/23/21 0748 06/24/21 0433 06/25/21 0136  WBC 6.5 5.7  --  4.9 3.9*  HGB 15.4 15.2 16.0 12.2* 11.8*  HCT 45.9 44.9 47.0 35.6* 35.3*  PLT 181 139*  --  133* 127*  MCV 92.5 92.8  --  92.5 92.7  MCH 31.0 31.4  --  31.7 31.0  MCHC  33.6 33.9  --  34.3 33.4  RDW 13.7 13.2  --  13.2 13.2  LYMPHSABS 1.5 1.4  --   --  1.1  MONOABS 0.6 0.5  --   --  0.4  EOSABS 0.1 0.1  --   --  0.1  BASOSABS 0.0 0.0  --   --  0.0    Electrolytes Recent Labs  Lab 06/20/21 1839 06/23/21 0645 06/23/21 0738 06/23/21 0748 06/23/21 1813 06/24/21 0433 06/24/21 0815 06/25/21 0136  NA 133* 134*  --  137  --  134*  --  135  K 3.6 3.5  --  3.9  --  3.7  --  3.8  CL 102 99  --  96*  --  101  --  104  CO2 24 25  --   --   --  28  --  26  GLUCOSE 99 123*  --  110*  --  98  --  105*  BUN 15 13  --  16  --  10  --  6  CREATININE 1.41* 1.37*  --  1.30*  --  1.33*  --  1.39*  CALCIUM 9.0 9.2  --   --   --  8.2*  --  8.4*  AST 23 23  --   --   --   --   --  19  ALT 20 20  --   --   --   --   --  15  ALKPHOS 89 77  --   --   --   --   --  65  BILITOT 1.6* 1.6*  --   --   --   --   --  0.8  ALBUMIN 4.4 4.0  --   --   --   --   --  3.0*  MG  --   --   --   --   --   --   --  2.0  CRP  --   --   --   --   --   --  0.6 0.6  LATICACIDVEN 1.0  --  1.2  --  1.3  --   --   --   INR 1.1  --  1.0  --   --   --   --   --   BNP  --   --   --   --   --   --   --  15.5   No results for input(s): TSH, T4TOTAL, T3FREE, THYROIDAB in the last 72 hours.  Invalid input(s): FREET3 ------------------------------------------------------------------------------------------------------------------ ID Labs Recent Labs  Lab 06/20/21 1839 06/23/21 0645 06/23/21 0738 06/23/21 0748 06/23/21 1813 06/24/21 0433 06/24/21 0815 06/25/21 0136  WBC 6.5 5.7  --   --   --  4.9  --  3.9*  PLT 181 139*  --   --   --  133*  --  127*  CRP  --   --   --   --   --   --  0.6 0.6  LATICACIDVEN 1.0  --  1.2  --  1.3  --   --   --   CREATININE 1.41* 1.37*  --  1.30*  --  1.33*  --  1.39*   Cardiac Enzymes No results for input(s): CKMB, TROPONINI, MYOGLOBIN in the last 168 hours.  Invalid input(s): CK  Radiology Reports MR BRAIN W WO CONTRAST  Result Date:  06/22/2021 CLINICAL DATA:  Headache, febrile, HIV positive EXAM: MRI HEAD WITHOUT AND WITH CONTRAST TECHNIQUE: Multiplanar, multiecho pulse sequences of the brain and surrounding structures were obtained without and with intravenous contrast. CONTRAST:  67mL GADAVIST GADOBUTROL 1 MMOL/ML IV SOLN COMPARISON:  CT head 06/20/2021 FINDINGS: Brain: There is no evidence of acute intracranial hemorrhage, extra-axial fluid collection, or acute infarct. Parenchymal volume is normal. The ventricles are normal in size. There are scattered small foci of FLAIR signal abnormality in the subcortical and periventricular white matter, nonspecific but likely reflecting sequela of mild chronic white matter microangiopathy. There is no suspicious parenchymal signal abnormality. There is no mass lesion or abnormal enhancement. There is no midline shift. Vascular: Normal flow voids. Skull and upper cervical spine: Postsurgical changes are noted in the upper cervical spine. There is no suspicious marrow signal abnormality. Sinuses/Orbits: The paranasal sinuses are clear. Elongation of the globes likely reflects sequela of myopia. The globes and orbits are otherwise unremarkable. Other: None. IMPRESSION: No acute intracranial pathology.  No abnormal enhancement. Electronically Signed   By: Valetta Mole M.D.   On: 06/22/2021 21:04   DG Chest Port 1 View  Result Date: 06/23/2021 CLINICAL DATA:  Questionable sepsis. EXAM: PORTABLE CHEST 1 VIEW COMPARISON:  None. FINDINGS: Normal mediastinum and cardiac silhouette. Normal pulmonary vasculature. No evidence of effusion, infiltrate, or pneumothorax. No acute bony abnormality. Anterior cervical fusion IMPRESSION: No acute cardiopulmonary process. Electronically Signed   By: Suzy Bouchard M.D.   On: 06/23/2021 07:47   DG FL GUIDED LUMBAR PUNCTURE  Result Date: 06/23/2021 CLINICAL DATA:  Provided history: Fever. EXAM: DIAGNOSTIC LUMBAR PUNCTURE UNDER FLUOROSCOPIC GUIDANCE COMPARISON:   Lumbar spine radiographs 02/01/2014. MRI of the lumbar spine 06/02/2013. FLUOROSCOPY TIME:  Fluoroscopy Time:  2 minutes, 48 seconds Radiation Exposure Index (if provided by the fluoroscopic device): 23.40 mGy Number of Acquired Spot Images: 2 PROCEDURE: Candiss Norse, PA obtained informed consent from the patient prior to the procedure, with a discussion of potential procedure risks, benefits and alternatives. The procedure was performed by Candiss Norse, PA. I was briefly called to the fluoroscopy suite for assistance. With the patient prone, the lower back was prepped with Betadine. 1% Lidocaine was used for local anesthesia. Lumbar puncture was performed at the L4-L5 level using a 20 gauge needle with return of clear CSF. 11 ml of CSF were obtained for laboratory studies. The patient tolerated the procedure well. No immediate post-procedure complication was apparent. IMPRESSION: Technically successful fluoroscopically-guided lumbar puncture at L4-L5. 11 mL  of CSF obtained for laboratory studies. Electronically Signed   By: Kellie Simmering D.O.   On: 06/23/2021 10:36

## 2021-06-26 ENCOUNTER — Inpatient Hospital Stay (HOSPITAL_COMMUNITY): Payer: Medicare Other

## 2021-06-26 LAB — CBC WITH DIFFERENTIAL/PLATELET
Abs Immature Granulocytes: 0.01 10*3/uL (ref 0.00–0.07)
Basophils Absolute: 0 10*3/uL (ref 0.0–0.1)
Basophils Relative: 1 %
Eosinophils Absolute: 0.2 10*3/uL (ref 0.0–0.5)
Eosinophils Relative: 4 %
HCT: 37.4 % — ABNORMAL LOW (ref 39.0–52.0)
Hemoglobin: 13.2 g/dL (ref 13.0–17.0)
Immature Granulocytes: 0 %
Lymphocytes Relative: 39 %
Lymphs Abs: 2 10*3/uL (ref 0.7–4.0)
MCH: 31.9 pg (ref 26.0–34.0)
MCHC: 35.3 g/dL (ref 30.0–36.0)
MCV: 90.3 fL (ref 80.0–100.0)
Monocytes Absolute: 0.4 10*3/uL (ref 0.1–1.0)
Monocytes Relative: 9 %
Neutro Abs: 2.4 10*3/uL (ref 1.7–7.7)
Neutrophils Relative %: 47 %
Platelets: 166 10*3/uL (ref 150–400)
RBC: 4.14 MIL/uL — ABNORMAL LOW (ref 4.22–5.81)
RDW: 13.2 % (ref 11.5–15.5)
WBC: 5.1 10*3/uL (ref 4.0–10.5)
nRBC: 0 % (ref 0.0–0.2)

## 2021-06-26 LAB — COMPREHENSIVE METABOLIC PANEL
ALT: 16 U/L (ref 0–44)
AST: 19 U/L (ref 15–41)
Albumin: 3.3 g/dL — ABNORMAL LOW (ref 3.5–5.0)
Alkaline Phosphatase: 79 U/L (ref 38–126)
Anion gap: 8 (ref 5–15)
BUN: 5 mg/dL — ABNORMAL LOW (ref 6–20)
CO2: 23 mmol/L (ref 22–32)
Calcium: 8.8 mg/dL — ABNORMAL LOW (ref 8.9–10.3)
Chloride: 105 mmol/L (ref 98–111)
Creatinine, Ser: 1.29 mg/dL — ABNORMAL HIGH (ref 0.61–1.24)
GFR, Estimated: >60 mL/min (ref >60)
Glucose, Bld: 103 mg/dL — ABNORMAL HIGH (ref 70–99)
Potassium: 3.4 mmol/L — ABNORMAL LOW (ref 3.5–5.1)
Sodium: 136 mmol/L (ref 135–145)
Total Bilirubin: 1 mg/dL (ref 0.3–1.2)
Total Protein: 6 g/dL — ABNORMAL LOW (ref 6.5–8.1)

## 2021-06-26 LAB — CSF CULTURE W GRAM STAIN: Culture: NO GROWTH

## 2021-06-26 LAB — HSV 1/2 PCR, CSF
HSV-1 DNA: NEGATIVE
HSV-2 DNA: NEGATIVE

## 2021-06-26 LAB — CULTURE, BLOOD (ROUTINE X 2): Culture: NO GROWTH

## 2021-06-26 LAB — MAGNESIUM: Magnesium: 1.8 mg/dL (ref 1.7–2.4)

## 2021-06-26 LAB — C-REACTIVE PROTEIN: CRP: 0.7 mg/dL (ref <1.0)

## 2021-06-26 LAB — BRAIN NATRIURETIC PEPTIDE: B Natriuretic Peptide: 22.8 pg/mL (ref 0.0–100.0)

## 2021-06-26 MED ORDER — POTASSIUM CHLORIDE CRYS ER 20 MEQ PO TBCR
40.0000 meq | EXTENDED_RELEASE_TABLET | Freq: Once | ORAL | Status: AC
  Administered 2021-06-26: 09:00:00 40 meq via ORAL
  Filled 2021-06-26: qty 2

## 2021-06-26 MED ORDER — METOPROLOL TARTRATE 25 MG PO TABS
25.0000 mg | ORAL_TABLET | Freq: Two times a day (BID) | ORAL | Status: DC
  Administered 2021-06-26 – 2021-06-28 (×5): 25 mg via ORAL
  Filled 2021-06-26 (×5): qty 1

## 2021-06-26 MED ORDER — HYDROCODONE-ACETAMINOPHEN 5-325 MG PO TABS
1.0000 | ORAL_TABLET | Freq: Four times a day (QID) | ORAL | Status: DC | PRN
  Filled 2021-06-26: qty 1

## 2021-06-26 NOTE — Progress Notes (Signed)
Pharmacy Antibiotic Note  Casey Brennan is a 60 y.o. 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  Pharmacy has been consulted for acyclovir dosing.  WBC 5.1, afebrile. Scr 1.29 (CrCl 70.9 mL/min). CSF cx showing no orgs - PCR pending. ID following.   Plan: Order acyclovir 950 mg (10 mg/kg) every 8 hours NS@75  mL/hr  Monitor BMP q72 hr, cx results, clinical pic, and ID recs   Height: 5\' 9"  (175.3 cm) Weight: 94.8 kg (209 lb) IBW/kg (Calculated) : 70.7  Temp (24hrs), Avg:98.9 F (37.2 C), Min:98.5 F (36.9 C), Max:99.4 F (37.4 C)  Recent Labs  Lab 06/20/21 1839 06/23/21 0645 06/23/21 0738 06/23/21 0748 06/23/21 1813 06/24/21 0433 06/25/21 0136 06/26/21 0538  WBC 6.5 5.7  --   --   --  4.9 3.9* 5.1  CREATININE 1.41* 1.37*  --  1.30*  --  1.33* 1.39* 1.29*  LATICACIDVEN 1.0  --  1.2  --  1.3  --   --   --      Estimated Creatinine Clearance: 70.9 mL/min (A) (by C-G formula based on SCr of 1.29 mg/dL (H)).    Allergies  Allergen Reactions   Amoxapine Shortness Of Breath   Chocolate Shortness Of Breath and Other (See Comments)    Asthma attack, welts   Descovy [Emtricitabine-Tenofovir Af] Shortness Of Breath    Nasuea, vomiiting, diarrhea, sob, whelps, throat closes   Genvoya [Elviteg-Cobic-Emtricit-Tenofaf] Shortness Of Breath    Nausea, vomitting, diarreha, sob, rash, throat closing   Morphine And Related Shortness Of Breath, Itching and Other (See Comments)     chest pain   Oxycodone-Acetaminophen Shortness Of Breath   Penicillins Shortness Of Breath and Rash    Has patient had a PCN reaction causing immediate rash, facial/tongue/throat swelling, SOB or lightheadedness with hypotension: Yes Has patient had a PCN reaction causing severe rash involving mucus membranes or skin necrosis: No Has patient had a PCN reaction that required hospitalization No Has patient had a PCN reaction occurring within the last 10 years: Yes If all of the above answers are  "NO", then may proceed with Cephalosporin use.   Sulfa Antibiotics Anaphylaxis and Other (See Comments)   Tomato Shortness Of Breath   Vancomycin Shortness Of Breath and Other (See Comments)   Darunavir Itching   Carvedilol     LEG SWELLING, DIZZNESS   Flagyl [Metronidazole] Itching   Milk-Related Compounds    Morphine Other (See Comments)   Penicillin G Other (See Comments)   Toradol [Ketorolac Tromethamine] Nausea And Vomiting    itching   Acetaminophen Rash and Other (See Comments)    Pt tolerates percocet and also plain APAP without rash   Tramadol Itching, Nausea And Vomiting and Other (See Comments)    Antimicrobials this admission: Acyclovir 12/22 >>   Dose adjustments this admission: N/A  Microbiology results: 12/22 VZV PCR Cx: sent 12/22 CSF Cx: no orgs seen  12/22 UCx: sent   12/22 COVID/Flu PCR: neg 12/22 Bcx: sent   Thank you for allowing pharmacy to be a part of this patients care.  Joseph Art, Pharm.D. PGY-1 Pharmacy Resident ASNKN:[id number]/26/2021 9:31 AM

## 2021-06-26 NOTE — Plan of Care (Signed)

## 2021-06-26 NOTE — Progress Notes (Signed)
PROGRESS NOTE                                                                                                                                                                                                             Patient Demographics:    Casey Brennan, is a 60 y.o. male, DOB - 29-Oct-1960, LKG:[id number]  Outpatient Primary MD for the patient is Biagio Borg, MD    LOS - 3  Admit date - 06/23/2021    Chief Complaint  Patient presents with   Emesis       Brief Narrative (HPI from H&P)   Casey Brennan is a 60 y.o. male with medical history significant of HIV; HTN; and prostate CA presenting with meningitis, sent by PCP.  He was seen in the ER on 12/19 for fever, myalgias, headache.   He had a spinal fusion on 3/30 (Dumonski). 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   He had some left arm pain as well, dull and to the hand.  His L 2-4 fingers are numb.  He had a nerve block with Dr. Ron Agee and he felt improvement in headache until that worse off.  Headaches recurred and he felt like he was spinning.  Some green mucus and coughing up green sputum last Tuesday, fever to 101.5.  Fever waxed and waned, he finally presented to the ER where work-up including LP was suggestive of viral meningitis he was seen by ID and admitted to the hospital.   Subjective:   Patient in bed, appears comfortable, improving headache, no fever, no chest pain or pressure, no shortness of breath , no abdominal pain. No new focal weakness.   Assessment  & Plan :   Viral meningitis.  MRI brain nonacute, LP noted, follow-up on viral cultures and PCR, ID on board currently on IV acyclovir with IV fluids and continue.   2.  History of HIV.  In good control, ID following continue Biktarvy.  3.  History of spine surgery by Dr. Lynann Bologna.  Appears stable.  4.   Dyslipidemia.  Outpatient follow-up with PCP.  5.  COPD.  Stable no acute issues supportive care.  6.  History of prostate cancer.  No acute issues.  7.  CKD  3A.  Baseline creatinine around 1.3.  Monitor, check UA - Renal US.  8. HTN  - Low dose B blocker.      Condition - Fair  Family Communication  :  Sister April [phone] on 06/24/21, 06/25/21  Code Status :  Full  Consults  :  IR, ID  PUD Prophylaxis : PPI   Procedures  :     MRI brain.  Nonacute.  CT scan abdomen pelvis outpatient done recently.  Questionable mild proctitis  LP.      Disposition Plan  :    Status is: Inpatient  Remains inpatient appropriate because: Meningitis  DVT Prophylaxis  :    enoxaparin (LOVENOX) injection 40 mg Start: 06/23/21 1800   Lab Results  Component Value Date   PLT 166 06/26/2021    Diet :  Diet Order             Diet regular Room service appropriate? Yes; Fluid consistency: Thin  Diet effective now                    Inpatient Medications  Scheduled Meds:  bictegravir-emtricitabine-tenofovir AF  1 tablet Oral Daily   docusate sodium  100 mg Oral BID   enoxaparin (LOVENOX) injection  40 mg Subcutaneous Q24H   fluticasone  2 spray Each Nare Daily   fluticasone furoate-vilanterol  1 puff Inhalation Daily   hydrOXYzine  25 mg Oral QHS   loratadine  10 mg Oral Daily   methocarbamol  500 mg Oral QID   metoprolol tartrate  25 mg Oral BID   montelukast  10 mg Oral Daily   multivitamin with minerals  1 tablet Oral Daily   pantoprazole  40 mg Oral BID   sodium chloride flush  3 mL Intravenous Q12H   tiZANidine  6 mg Oral TID   umeclidinium bromide  1 puff Inhalation Daily   Continuous Infusions:  sodium chloride 75 mL/hr at 06/26/21 0445   acyclovir 950 mg (06/26/21 0559)   PRN Meds:.albuterol, ALPRAZolam, bisacodyl, diphenhydrAMINE, fentaNYL (SUBLIMAZE) injection, hydrALAZINE, HYDROcodone-acetaminophen, ondansetron **OR** ondansetron (ZOFRAN) IV,  polyethylene glycol  Antibiotics  :    Anti-infectives (From admission, onward)    Start     Dose/Rate Route Frequency Ordered Stop   06/24/21 1000  bictegravir-emtricitabine-tenofovir AF (BIKTARVY) 50-200-25 MG per tablet 1 tablet        1 tablet Oral Daily 06/23/21 1522     06/23/21 2000  acyclovir (ZOVIRAX) 950 mg in dextrose 5 % 150 mL IVPB        10 mg/kg  94.8 kg 169 mL/hr over 60 Minutes Intravenous Every 8 hours 06/23/21 1901     06/23/21 1430  acyclovir (ZOVIRAX) 200 MG capsule 400 mg        400 mg Oral  Once 06/23/21 1357 06/23/21 1513        Time Spent in minutes  30   Casey Brennan M.D on 06/26/2021 at 10:36 AM  To page go to [email or web address]   [facility] -  Office  [phone]  See all Orders from today for further details    Objective:   Vitals:   06/25/21 2057 06/26/21 0007 06/26/21 0426 06/26/21 0725  BP: 134/81 133/83 (!) 136/98 (!) 140/93  Pulse: 96 99 91 (!) 101  Resp: 20 18 20    Temp: 99.4 F (37.4 C) 98.5 F (36.9 C) 98.9 F (37.2 C) 99.2 F (37.3 C)  TempSrc: Oral Oral Oral Oral  SpO2: 92% 91% 92%  91%  Weight:      Height:        Wt Readings from Last 3 Encounters:  06/23/21 94.8 kg  06/22/21 95.3 kg  06/20/21 95.3 kg     Intake/Output Summary (Last 24 hours) at 06/26/2021 1036 Last data filed at 06/26/2021 0600 Gross per 24 hour  Intake 134.53 ml  Output 1800 ml  Net -1665.47 ml     Physical Exam  Awake Alert, No new F.N deficits, Normal affect [place].AT,PERRAL Supple Neck, No JVD,   Symmetrical Chest wall movement, Good air movement bilaterally, CTAB RRR,No Gallops, Rubs or new Murmurs,  +ve B.Sounds, Abd Soft, No tenderness,   No Cyanosis, Clubbing or edema       Data Review:    CBC Recent Labs  Lab 06/20/21 1839 06/23/21 0645 06/23/21 0748 06/24/21 0433 06/25/21 0136 06/26/21 0538  WBC 6.5 5.7  --  4.9 3.9* 5.1  HGB 15.4 15.2 16.0 12.2* 11.8* 13.2  HCT 45.9 44.9 47.0 35.6* 35.3* 37.4*  PLT 181 139*   --  133* 127* 166  MCV 92.5 92.8  --  92.5 92.7 90.3  MCH 31.0 31.4  --  31.7 31.0 31.9  MCHC 33.6 33.9  --  34.3 33.4 35.3  RDW 13.7 13.2  --  13.2 13.2 13.2  LYMPHSABS 1.5 1.4  --   --  1.1 2.0  MONOABS 0.6 0.5  --   --  0.4 0.4  EOSABS 0.1 0.1  --   --  0.1 0.2  BASOSABS 0.0 0.0  --   --  0.0 0.0    Electrolytes Recent Labs  Lab 06/20/21 1839 06/23/21 0645 06/23/21 0738 06/23/21 0748 06/23/21 1813 06/24/21 0433 06/24/21 0815 06/25/21 0136 06/26/21 0538  NA 133* 134*  --  137  --  134*  --  135 136  K 3.6 3.5  --  3.9  --  3.7  --  3.8 3.4*  CL 102 99  --  96*  --  101  --  104 105  CO2 24 25  --   --   --  28  --  26 23  GLUCOSE 99 123*  --  110*  --  98  --  105* 103*  BUN 15 13  --  16  --  10  --  6 5*  CREATININE 1.41* 1.37*  --  1.30*  --  1.33*  --  1.39* 1.29*  CALCIUM 9.0 9.2  --   --   --  8.2*  --  8.4* 8.8*  AST 23 23  --   --   --   --   --  19 19  ALT 20 20  --   --   --   --   --  15 16  ALKPHOS 89 77  --   --   --   --   --  65 79  BILITOT 1.6* 1.6*  --   --   --   --   --  0.8 1.0  ALBUMIN 4.4 4.0  --   --   --   --   --  3.0* 3.3*  MG  --   --   --   --   --   --   --  2.0 1.8  CRP  --   --   --   --   --   --  0.6 0.6 0.7  LATICACIDVEN 1.0  --  1.2  --  1.3  --   --   --   --   INR 1.1  --  1.0  --   --   --   --   --   --   BNP  --   --   --   --   --   --   --  15.5 22.8   No results for input(s): TSH, T4TOTAL, T3FREE, THYROIDAB in the last 72 hours.  Invalid input(s): FREET3 ------------------------------------------------------------------------------------------------------------------ ID Labs Recent Labs  Lab 06/20/21 1839 06/23/21 0645 06/23/21 8413 06/23/21 0748 06/23/21 1813 06/24/21 0433 06/24/21 0815 06/25/21 0136 06/26/21 0538  WBC 6.5 5.7  --   --   --  4.9  --  3.9* 5.1  PLT 181 139*  --   --   --  133*  --  127* 166  CRP  --   --   --   --   --   --  0.6 0.6 0.7  LATICACIDVEN 1.0  --  1.2  --  1.3  --   --   --   --    CREATININE 1.41* 1.37*  --  1.30*  --  1.33*  --  1.39* 1.29*   Cardiac Enzymes No results for input(s): CKMB, TROPONINI, MYOGLOBIN in the last 168 hours.  Invalid input(s): CK  Radiology Reports MR BRAIN W WO CONTRAST  Result Date: 06/22/2021 CLINICAL DATA:  Headache, febrile, HIV positive EXAM: MRI HEAD WITHOUT AND WITH CONTRAST TECHNIQUE: Multiplanar, multiecho pulse sequences of the brain and surrounding structures were obtained without and with intravenous contrast. CONTRAST:  28mL GADAVIST GADOBUTROL 1 MMOL/ML IV SOLN COMPARISON:  CT head 06/20/2021 FINDINGS: Brain: There is no evidence of acute intracranial hemorrhage, extra-axial fluid collection, or acute infarct. Parenchymal volume is normal. The ventricles are normal in size. There are scattered small foci of FLAIR signal abnormality in the subcortical and periventricular white matter, nonspecific but likely reflecting sequela of mild chronic white matter microangiopathy. There is no suspicious parenchymal signal abnormality. There is no mass lesion or abnormal enhancement. There is no midline shift. Vascular: Normal flow voids. Skull and upper cervical spine: Postsurgical changes are noted in the upper cervical spine. There is no suspicious marrow signal abnormality. Sinuses/Orbits: The paranasal sinuses are clear. Elongation of the globes likely reflects sequela of myopia. The globes and orbits are otherwise unremarkable. Other: None. IMPRESSION: No acute intracranial pathology.  No abnormal enhancement. Electronically Signed   By: Valetta Mole M.D.   On: 06/22/2021 21:04   US RENAL  Result Date: 06/25/2021 CLINICAL DATA:  Acute kidney injury EXAM: RENAL / URINARY TRACT ULTRASOUND COMPLETE COMPARISON:  CT 06/20/2021 FINDINGS: Right Kidney: Renal measurements: 11.7 x 6.6 x 6.3 cm = volume: 254.2 mL. Echogenicity within normal limits. No hydronephrosis. Multiple, fewer than 10 cysts within the right kidney. The largest is seen at the  upper pole and measures 37 mm. Left Kidney: Renal measurements: 12.3 x 6.3 x 6 cm = volume: 241.9 mL. Echogenicity within normal limits. No hydronephrosis. Cysts within the left kidney. The largest is seen at the mid to lower pole and measures 29 mm. Bladder: Appears normal for degree of bladder distention. Other: Small solid nodule in the region of right adrenal gland measuring 1.9 x 1.9 x 1.6 cm, thought to represent adenoma on CT IMPRESSION: 1. Simple appearing renal cysts.  No hydronephrosis. 2. 19 mm solid nodule in the region of right adrenal gland corresponding to presumed adenoma on CT. Electronically Signed  By: Donavan Foil M.D.   On: 06/25/2021 17:38   DG Chest Port 1 View  Result Date: 06/26/2021 CLINICAL DATA:  Viral meningitis, shortness of breath EXAM: PORTABLE CHEST 1 VIEW COMPARISON:  06/23/2021 FINDINGS: Heart is normal size. Minimal right base atelectasis. Left lung clear. No effusions. No acute bony abnormality. IMPRESSION: Minimal right base atelectasis. Electronically Signed   By: Rolm Baptise M.D.   On: 06/26/2021 08:41   DG Chest Port 1 View  Result Date: 06/23/2021 CLINICAL DATA:  Questionable sepsis. EXAM: PORTABLE CHEST 1 VIEW COMPARISON:  None. FINDINGS: Normal mediastinum and cardiac silhouette. Normal pulmonary vasculature. No evidence of effusion, infiltrate, or pneumothorax. No acute bony abnormality. Anterior cervical fusion IMPRESSION: No acute cardiopulmonary process. Electronically Signed   By: Suzy Bouchard M.D.   On: 06/23/2021 07:47   DG FL GUIDED LUMBAR PUNCTURE  Result Date: 06/23/2021 CLINICAL DATA:  Provided history: Fever. EXAM: DIAGNOSTIC LUMBAR PUNCTURE UNDER FLUOROSCOPIC GUIDANCE COMPARISON:  Lumbar spine radiographs 02/01/2014. MRI of the lumbar spine 06/02/2013. FLUOROSCOPY TIME:  Fluoroscopy Time:  2 minutes, 48 seconds Radiation Exposure Index (if provided by the fluoroscopic device): 23.40 mGy Number of Acquired Spot Images: 2 PROCEDURE: Candiss Norse, PA obtained informed consent from the patient prior to the procedure, with a discussion of potential procedure risks, benefits and alternatives. The procedure was performed by Candiss Norse, PA. I was briefly called to the fluoroscopy suite for assistance. With the patient prone, the lower back was prepped with Betadine. 1% Lidocaine was used for local anesthesia. Lumbar puncture was performed at the L4-L5 level using a 20 gauge needle with return of clear CSF. 11 ml of CSF were obtained for laboratory studies. The patient tolerated the procedure well. No immediate post-procedure complication was apparent. IMPRESSION: Technically successful fluoroscopically-guided lumbar puncture at L4-L5. 11 mL of CSF obtained for laboratory studies. Electronically Signed   By: Kellie Simmering D.O.   On: 06/23/2021 10:36

## 2021-06-27 LAB — COMPREHENSIVE METABOLIC PANEL
ALT: 14 U/L (ref 0–44)
AST: 17 U/L (ref 15–41)
Albumin: 3 g/dL — ABNORMAL LOW (ref 3.5–5.0)
Alkaline Phosphatase: 66 U/L (ref 38–126)
Anion gap: 10 (ref 5–15)
BUN: 6 mg/dL (ref 6–20)
CO2: 22 mmol/L (ref 22–32)
Calcium: 8.8 mg/dL — ABNORMAL LOW (ref 8.9–10.3)
Chloride: 105 mmol/L (ref 98–111)
Creatinine, Ser: 1.18 mg/dL (ref 0.61–1.24)
GFR, Estimated: >60 mL/min (ref >60)
Glucose, Bld: 88 mg/dL (ref 70–99)
Potassium: 3.9 mmol/L (ref 3.5–5.1)
Sodium: 137 mmol/L (ref 135–145)
Total Bilirubin: 0.8 mg/dL (ref 0.3–1.2)
Total Protein: 5.7 g/dL — ABNORMAL LOW (ref 6.5–8.1)

## 2021-06-27 LAB — MAGNESIUM: Magnesium: 1.9 mg/dL (ref 1.7–2.4)

## 2021-06-27 LAB — CBC WITH DIFFERENTIAL/PLATELET
Abs Immature Granulocytes: 0.08 10*3/uL — ABNORMAL HIGH (ref 0.00–0.07)
Basophils Absolute: 0 10*3/uL (ref 0.0–0.1)
Basophils Relative: 1 %
Eosinophils Absolute: 0.2 10*3/uL (ref 0.0–0.5)
Eosinophils Relative: 4 %
HCT: 35.5 % — ABNORMAL LOW (ref 39.0–52.0)
Hemoglobin: 12.3 g/dL — ABNORMAL LOW (ref 13.0–17.0)
Immature Granulocytes: 2 %
Lymphocytes Relative: 36 %
Lymphs Abs: 1.7 10*3/uL (ref 0.7–4.0)
MCH: 31.3 pg (ref 26.0–34.0)
MCHC: 34.6 g/dL (ref 30.0–36.0)
MCV: 90.3 fL (ref 80.0–100.0)
Monocytes Absolute: 0.5 10*3/uL (ref 0.1–1.0)
Monocytes Relative: 10 %
Neutro Abs: 2.3 10*3/uL (ref 1.7–7.7)
Neutrophils Relative %: 47 %
Platelets: 147 10*3/uL — ABNORMAL LOW (ref 150–400)
RBC: 3.93 MIL/uL — ABNORMAL LOW (ref 4.22–5.81)
RDW: 13.2 % (ref 11.5–15.5)
WBC: 4.8 10*3/uL (ref 4.0–10.5)
nRBC: 0 % (ref 0.0–0.2)

## 2021-06-27 LAB — C-REACTIVE PROTEIN: CRP: 0.5 mg/dL (ref <1.0)

## 2021-06-27 LAB — BRAIN NATRIURETIC PEPTIDE: B Natriuretic Peptide: 26.5 pg/mL (ref 0.0–100.0)

## 2021-06-27 MED ORDER — MAGNESIUM HYDROXIDE 400 MG/5ML PO SUSP
30.0000 mL | Freq: Two times a day (BID) | ORAL | Status: AC
  Administered 2021-06-27 (×2): 30 mL via ORAL
  Filled 2021-06-27 (×2): qty 30

## 2021-06-27 MED ORDER — POLYETHYLENE GLYCOL 3350 17 G PO PACK
17.0000 g | PACK | Freq: Two times a day (BID) | ORAL | Status: DC
  Administered 2021-06-27 – 2021-06-28 (×3): 17 g via ORAL
  Filled 2021-06-27 (×3): qty 1

## 2021-06-27 MED ORDER — DOCUSATE SODIUM 100 MG PO CAPS
200.0000 mg | ORAL_CAPSULE | Freq: Two times a day (BID) | ORAL | Status: DC
  Administered 2021-06-27 – 2021-06-28 (×3): 200 mg via ORAL
  Filled 2021-06-27 (×3): qty 2

## 2021-06-27 MED ORDER — BISACODYL 5 MG PO TBEC
10.0000 mg | DELAYED_RELEASE_TABLET | Freq: Once | ORAL | Status: AC
  Administered 2021-06-27: 11:00:00 10 mg via ORAL
  Filled 2021-06-27: qty 2

## 2021-06-27 NOTE — Progress Notes (Signed)
PROGRESS NOTE                                                                                                                                                                                                             Patient Demographics:    Casey Brennan, is a 60 y.o. male, DOB - 12/08/1960, EHU:[id number]  Outpatient Primary MD for the patient is Biagio Borg, MD    LOS - 4  Admit date - 06/23/2021    Chief Complaint  Patient presents with   Emesis       Brief Narrative (HPI from H&P)   Casey Brennan is a 60 y.o. male with medical history significant of HIV; HTN; and prostate CA presenting with meningitis, sent by PCP.  He was seen in the ER on 12/19 for fever, myalgias, headache.   He had a spinal fusion on 3/30 ([facility]onski). 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   He had some left arm pain as well, dull and to the hand.  His L 2-4 fingers are numb.  He had a nerve block with Dr. Ron Agee and he felt improvement in headache until that worse off.  Headaches recurred and he felt like he was spinning.  Some green mucus and coughing up green sputum last Tuesday, fever to 101.5.  Fever waxed and waned, he finally presented to the ER where work-up including LP was suggestive of viral meningitis he was seen by ID and admitted to the hospital.   Subjective:   Patient in bed, appears comfortable, improved headache, no fever, no chest pain or pressure, no shortness of breath , no abdominal pain. No focal weakness.  Is constipated.   Assessment  & Plan :   Viral meningitis.  MRI brain nonacute, LP noted, CSF showing HSV PCR negative, VZV PCR pending, ID on board currently on IV acyclovir with IV fluids and continue.  Headache is improving.   2.  History of HIV.  In good control, ID following continue Biktarvy.  3.  History of spine surgery by Dr.  Lynann Bologna.  Appears stable.  4.  Dyslipidemia.  Outpatient follow-up with PCP.  5.  COPD.  Stable no acute issues supportive care.  6.  History of prostate cancer.  No acute issues.  7.  CKD 3A.  Baseline creatinine around 1.3.  Monitor, check UA - Renal US.  8.  Constipation.  Placed on bowel regimen.       Condition - Fair  Family Communication  :  Sister April [phone] on 06/24/21, 06/25/21  Code Status :  Full  Consults  :  IR, ID  PUD Prophylaxis : PPI   Procedures  :     MRI brain.  Nonacute.  CT scan abdomen pelvis outpatient done recently.  Questionable mild proctitis  LP.      Disposition Plan  :    Status is: Inpatient  Remains inpatient appropriate because: Meningitis  DVT Prophylaxis  :    enoxaparin (LOVENOX) injection 40 mg Start: 06/23/21 1800   Lab Results  Component Value Date   PLT 147 (L) 06/27/2021    Diet :  Diet Order             Diet regular Room service appropriate? Yes; Fluid consistency: Thin  Diet effective now                    Inpatient Medications  Scheduled Meds:  bictegravir-emtricitabine-tenofovir AF  1 tablet Oral Daily   bisacodyl  10 mg Oral Once   docusate sodium  200 mg Oral BID   enoxaparin (LOVENOX) injection  40 mg Subcutaneous Q24H   fluticasone  2 spray Each Nare Daily   fluticasone furoate-vilanterol  1 puff Inhalation Daily   hydrOXYzine  25 mg Oral QHS   loratadine  10 mg Oral Daily   magnesium hydroxide  30 mL Oral BID   methocarbamol  500 mg Oral QID   metoprolol tartrate  25 mg Oral BID   montelukast  10 mg Oral Daily   multivitamin with minerals  1 tablet Oral Daily   pantoprazole  40 mg Oral BID   polyethylene glycol  17 g Oral BID   sodium chloride flush  3 mL Intravenous Q12H   tiZANidine  6 mg Oral TID   umeclidinium bromide  1 puff Inhalation Daily   Continuous Infusions:  acyclovir 950 mg (06/27/21 0907)   PRN Meds:.albuterol, ALPRAZolam, diphenhydrAMINE, fentaNYL  (SUBLIMAZE) injection, hydrALAZINE, HYDROcodone-acetaminophen, ondansetron **OR** ondansetron (ZOFRAN) IV  Antibiotics  :    Anti-infectives (From admission, onward)    Start     Dose/Rate Route Frequency Ordered Stop   06/24/21 1000  bictegravir-emtricitabine-tenofovir AF (BIKTARVY) 50-200-25 MG per tablet 1 tablet        1 tablet Oral Daily 06/23/21 1522     06/23/21 2000  acyclovir (ZOVIRAX) 950 mg in dextrose 5 % 150 mL IVPB        10 mg/kg  94.8 kg 169 mL/hr over 60 Minutes Intravenous Every 8 hours 06/23/21 1901     06/23/21 1430  acyclovir (ZOVIRAX) 200 MG capsule 400 mg        400 mg Oral  Once 06/23/21 1357 06/23/21 1513        Time Spent in minutes  30   [name] M.D on 06/27/2021 at 9:52 AM  To page go to [email or web address]   [facility] -  Office  [phone]  See all Orders from today for further details    Objective:   Vitals:   06/26/21 2037 06/27/21 0000 06/27/21 0400 06/27/21 0819  BP: (!) 128/95 (!) 131/92 126/75 (!) 152/98  Pulse: 90 79 83 87  Resp:  18 18 18   Temp: 99.5 F (37.5 C)  98.9 F (37.2 C) 99.1 F (37.3 C) 99.2 F (37.3 C)  TempSrc: Oral Oral Axillary Oral  SpO2: 93% 95% 93% 94%  Weight:      Height:        Wt Readings from Last 3 Encounters:  06/23/21 94.8 kg  06/22/21 95.3 kg  06/20/21 95.3 kg     Intake/Output Summary (Last 24 hours) at 06/27/2021 0952 Last data filed at 06/27/2021 0819 Gross per 24 hour  Intake --  Output 1780 ml  Net -1780 ml     Physical Exam  Awake Alert, No new F.N deficits, Normal affect [place].AT,PERRAL Supple Neck, No JVD,   Symmetrical Chest wall movement, Good air movement bilaterally, CTAB RRR,No Gallops, Rubs or new Murmurs,  +ve B.Sounds, Abd Soft, No tenderness,   No Cyanosis, Clubbing or edema       Data Review:    CBC Recent Labs  Lab 06/20/21 1839 06/23/21 0645 06/23/21 0748 06/24/21 0433 06/25/21 0136 06/26/21 0538 06/27/21 0131  WBC 6.5 5.7  --  4.9 3.9*  5.1 4.8  HGB 15.4 15.2 16.0 12.2* 11.8* 13.2 12.3*  HCT 45.9 44.9 47.0 35.6* 35.3* 37.4* 35.5*  PLT 181 139*  --  133* 127* 166 147*  MCV 92.5 92.8  --  92.5 92.7 90.3 90.3  MCH 31.0 31.4  --  31.7 31.0 31.9 31.3  MCHC 33.6 33.9  --  34.3 33.4 35.3 34.6  RDW 13.7 13.2  --  13.2 13.2 13.2 13.2  LYMPHSABS 1.5 1.4  --   --  1.1 2.0 1.7  MONOABS 0.6 0.5  --   --  0.4 0.4 0.5  EOSABS 0.1 0.1  --   --  0.1 0.2 0.2  BASOSABS 0.0 0.0  --   --  0.0 0.0 0.0    Electrolytes Recent Labs  Lab 06/20/21 1839 06/23/21 0645 06/23/21 0738 06/23/21 0748 06/23/21 1813 06/24/21 0433 06/24/21 0815 06/25/21 0136 06/26/21 0538 06/27/21 0131  NA 133* 134*  --  137  --  134*  --  135 136 137  K 3.6 3.5  --  3.9  --  3.7  --  3.8 3.4* 3.9  CL 102 99  --  96*  --  101  --  104 105 105  CO2 24 25  --   --   --  28  --  26 23 22   GLUCOSE 99 123*  --  110*  --  98  --  105* 103* 88  BUN 15 13  --  16  --  10  --  6 5* 6  CREATININE 1.41* 1.37*  --  1.30*  --  1.33*  --  1.39* 1.29* 1.18  CALCIUM 9.0 9.2  --   --   --  8.2*  --  8.4* 8.8* 8.8*  AST 23 23  --   --   --   --   --  19 19 17   ALT 20 20  --   --   --   --   --  15 16 14   ALKPHOS 89 77  --   --   --   --   --  65 79 66  BILITOT 1.6* 1.6*  --   --   --   --   --  0.8 1.0 0.8  ALBUMIN 4.4 4.0  --   --   --   --   --  3.0* 3.3* 3.0*  MG  --   --   --   --   --   --   --  2.0 1.8 1.9  CRP  --   --   --   --   --   --  0.6 0.6 0.7 0.5  LATICACIDVEN 1.0  --  1.2  --  1.3  --   --   --   --   --   INR 1.1  --  1.0  --   --   --   --   --   --   --   BNP  --   --   --   --   --   --   --  15.5 22.8 26.5   No results for input(s): TSH, T4TOTAL, T3FREE, THYROIDAB in the last 72 hours.  Invalid input(s): FREET3 ------------------------------------------------------------------------------------------------------------------ ID Labs Recent Labs  Lab 06/20/21 1839 06/23/21 0645 06/23/21 4268 06/23/21 0748 06/23/21 1813 06/24/21 0433  06/24/21 0815 06/25/21 0136 06/26/21 0538 06/27/21 0131  WBC 6.5 5.7  --   --   --  4.9  --  3.9* 5.1 4.8  PLT 181 139*  --   --   --  133*  --  127* 166 147*  CRP  --   --   --   --   --   --  0.6 0.6 0.7 0.5  LATICACIDVEN 1.0  --  1.2  --  1.3  --   --   --   --   --   CREATININE 1.41* 1.37*  --  1.30*  --  1.33*  --  1.39* 1.29* 1.18   Cardiac Enzymes No results for input(s): CKMB, TROPONINI, MYOGLOBIN in the last 168 hours.  Invalid input(s): CK  Radiology Reports US RENAL  Result Date: 06/25/2021 CLINICAL DATA:  Acute kidney injury EXAM: RENAL / URINARY TRACT ULTRASOUND COMPLETE COMPARISON:  CT 06/20/2021 FINDINGS: Right Kidney: Renal measurements: 11.7 x 6.6 x 6.3 cm = volume: 254.2 mL. Echogenicity within normal limits. No hydronephrosis. Multiple, fewer than 10 cysts within the right kidney. The largest is seen at the upper pole and measures 37 mm. Left Kidney: Renal measurements: 12.3 x 6.3 x 6 cm = volume: 241.9 mL. Echogenicity within normal limits. No hydronephrosis. Cysts within the left kidney. The largest is seen at the mid to lower pole and measures 29 mm. Bladder: Appears normal for degree of bladder distention. Other: Small solid nodule in the region of right adrenal gland measuring 1.9 x 1.9 x 1.6 cm, thought to represent adenoma on CT IMPRESSION: 1. Simple appearing renal cysts.  No hydronephrosis. 2. 19 mm solid nodule in the region of right adrenal gland corresponding to presumed adenoma on CT. Electronically Signed   By: Donavan Foil M.D.   On: 06/25/2021 17:38   DG Chest Port 1 View  Result Date: 06/26/2021 CLINICAL DATA:  Viral meningitis, shortness of breath EXAM: PORTABLE CHEST 1 VIEW COMPARISON:  06/23/2021 FINDINGS: Heart is normal size. Minimal right base atelectasis. Left lung clear. No effusions. No acute bony abnormality. IMPRESSION: Minimal right base atelectasis. Electronically Signed   By: Rolm Baptise M.D.   On: 06/26/2021 08:41   DG FL GUIDED LUMBAR  PUNCTURE  Result Date: 06/23/2021 CLINICAL DATA:  Provided history: Fever. EXAM: DIAGNOSTIC LUMBAR PUNCTURE UNDER FLUOROSCOPIC GUIDANCE COMPARISON:  Lumbar spine radiographs 02/01/2014. MRI of the lumbar spine 06/02/2013. FLUOROSCOPY TIME:  Fluoroscopy Time:  2 minutes, 48 seconds Radiation Exposure Index (if provided by the fluoroscopic device): 23.40 mGy Number of Acquired Spot Images: 2 PROCEDURE: Candiss Norse, PA obtained informed consent from the patient  prior to the procedure, with a discussion of potential procedure risks, benefits and alternatives. The procedure was performed by Candiss Norse, PA. I was briefly called to the fluoroscopy suite for assistance. With the patient prone, the lower back was prepped with Betadine. 1% Lidocaine was used for local anesthesia. Lumbar puncture was performed at the L4-L5 level using a 20 gauge needle with return of clear CSF. 11 ml of CSF were obtained for laboratory studies. The patient tolerated the procedure well. No immediate post-procedure complication was apparent. IMPRESSION: Technically successful fluoroscopically-guided lumbar puncture at L4-L5. 11 mL of CSF obtained for laboratory studies. Electronically Signed   By: Kellie Simmering D.O.   On: 06/23/2021 10:36

## 2021-06-28 ENCOUNTER — Other Ambulatory Visit: Payer: Self-pay | Admitting: Neurology

## 2021-06-28 ENCOUNTER — Ambulatory Visit: Payer: Medicare Other | Admitting: Internal Medicine

## 2021-06-28 DIAGNOSIS — R252 Cramp and spasm: Secondary | ICD-10-CM

## 2021-06-28 DIAGNOSIS — G44209 Tension-type headache, unspecified, not intractable: Secondary | ICD-10-CM

## 2021-06-28 LAB — COMPREHENSIVE METABOLIC PANEL
ALT: 16 U/L (ref 0–44)
AST: 16 U/L (ref 15–41)
Albumin: 3.4 g/dL — ABNORMAL LOW (ref 3.5–5.0)
Alkaline Phosphatase: 79 U/L (ref 38–126)
Anion gap: 7 (ref 5–15)
BUN: 8 mg/dL (ref 6–20)
CO2: 25 mmol/L (ref 22–32)
Calcium: 8.9 mg/dL (ref 8.9–10.3)
Chloride: 103 mmol/L (ref 98–111)
Creatinine, Ser: 1.31 mg/dL — ABNORMAL HIGH (ref 0.61–1.24)
GFR, Estimated: >60 mL/min (ref >60)
Glucose, Bld: 95 mg/dL (ref 70–99)
Potassium: 3.6 mmol/L (ref 3.5–5.1)
Sodium: 135 mmol/L (ref 135–145)
Total Bilirubin: 0.8 mg/dL (ref 0.3–1.2)
Total Protein: 6.3 g/dL — ABNORMAL LOW (ref 6.5–8.1)

## 2021-06-28 LAB — CBC WITH DIFFERENTIAL/PLATELET
Abs Immature Granulocytes: 0.02 10*3/uL (ref 0.00–0.07)
Basophils Absolute: 0 10*3/uL (ref 0.0–0.1)
Basophils Relative: 1 %
Eosinophils Absolute: 0.2 10*3/uL (ref 0.0–0.5)
Eosinophils Relative: 4 %
HCT: 38 % — ABNORMAL LOW (ref 39.0–52.0)
Hemoglobin: 12.9 g/dL — ABNORMAL LOW (ref 13.0–17.0)
Immature Granulocytes: 0 %
Lymphocytes Relative: 44 %
Lymphs Abs: 2.5 10*3/uL (ref 0.7–4.0)
MCH: 30.6 pg (ref 26.0–34.0)
MCHC: 33.9 g/dL (ref 30.0–36.0)
MCV: 90.3 fL (ref 80.0–100.0)
Monocytes Absolute: 0.4 10*3/uL (ref 0.1–1.0)
Monocytes Relative: 7 %
Neutro Abs: 2.6 10*3/uL (ref 1.7–7.7)
Neutrophils Relative %: 44 %
Platelets: 206 10*3/uL (ref 150–400)
RBC: 4.21 MIL/uL — ABNORMAL LOW (ref 4.22–5.81)
RDW: 13.2 % (ref 11.5–15.5)
WBC: 5.8 10*3/uL (ref 4.0–10.5)
nRBC: 0 % (ref 0.0–0.2)

## 2021-06-28 LAB — CULTURE, BLOOD (ROUTINE X 2)
Culture: NO GROWTH
Culture: NO GROWTH
Special Requests: ADEQUATE

## 2021-06-28 LAB — C-REACTIVE PROTEIN: CRP: 0.6 mg/dL (ref <1.0)

## 2021-06-28 LAB — MAGNESIUM: Magnesium: 2.1 mg/dL (ref 1.7–2.4)

## 2021-06-28 LAB — BRAIN NATRIURETIC PEPTIDE: B Natriuretic Peptide: 11.7 pg/mL (ref 0.0–100.0)

## 2021-06-28 LAB — VZV PCR, CSF: VZV PCR, CSF: NEGATIVE

## 2021-06-28 MED ORDER — HYDROCODONE-ACETAMINOPHEN 5-325 MG PO TABS: 1.0000 | ORAL_TABLET | Freq: Three times a day (TID) | ORAL | 0 refills | Status: DC | PRN

## 2021-06-28 MED ORDER — POLYETHYLENE GLYCOL 3350 17 G PO PACK
17.0000 g | PACK | Freq: Every day | ORAL | 0 refills | Status: DC
Start: 2021-06-28 — End: 2021-10-27

## 2021-06-28 MED ORDER — DULCOLAX 5 MG PO TBEC: 10.0000 mg | DELAYED_RELEASE_TABLET | Freq: Every day | ORAL | 0 refills | Status: DC | PRN

## 2021-06-28 MED ORDER — DOCUSATE SODIUM 100 MG PO CAPS: 200.0000 mg | ORAL_CAPSULE | Freq: Two times a day (BID) | ORAL | 0 refills | Status: DC

## 2021-06-28 MED ORDER — METOPROLOL TARTRATE 25 MG PO TABS: 25.0000 mg | ORAL_TABLET | Freq: Two times a day (BID) | ORAL | 0 refills | Status: DC

## 2021-06-28 NOTE — Plan of Care (Signed)
°  Problem: Education: Goal: Knowledge of General Education information will improve Description: Including pain rating scale, medication(s)/side effects and non-pharmacologic comfort measures Outcome: Adequate for Discharge   Problem: Health Behavior/Discharge Planning: Goal: Ability to manage health-related needs will improve Outcome: Adequate for Discharge   Problem: Clinical Measurements: Goal: Ability to maintain clinical measurements within normal limits will improve Outcome: Adequate for Discharge Goal: Will remain free from infection Outcome: Adequate for Discharge Goal: Diagnostic test results will improve Outcome: Adequate for Discharge Goal: Respiratory complications will improve Outcome: Adequate for Discharge Goal: Cardiovascular complication will be avoided Outcome: Adequate for Discharge   Problem: Activity: Goal: Risk for activity intolerance will decrease Outcome: Adequate for Discharge   Problem: Nutrition: Goal: Adequate nutrition will be maintained Outcome: Adequate for Discharge   Problem: Coping: Goal: Level of anxiety will decrease Outcome: Adequate for Discharge   Problem: Elimination: Goal: Will not experience complications related to bowel motility Outcome: Adequate for Discharge Goal: Will not experience complications related to urinary retention Outcome: Adequate for Discharge   Problem: Pain Managment: Goal: General experience of comfort will improve Outcome: Adequate for Discharge   Problem: Safety: Goal: Ability to remain free from injury will improve Outcome: Adequate for Discharge   Problem: Skin Integrity: Goal: Risk for impaired skin integrity will decrease Outcome: Adequate for Discharge   Problem: Acute Rehab PT Goals(only PT should resolve) Goal: Pt Will Ambulate Outcome: Adequate for Discharge Goal: Pt Will Go Up/Down Stairs Outcome: Adequate for Discharge   Problem: Malnutrition  (NI-5.2) Goal: Food and/or nutrient  delivery Description: Individualized approach for food/nutrient provision. Outcome: Adequate for Discharge

## 2021-06-28 NOTE — Progress Notes (Signed)
Discharge instructions, RX's and follow up appts explained and provided to patient verbalized understanding. Patient left floor walking no c/o pain or shortness of breath at d/c.  Marisela Line, Tivis Ringer, RN

## 2021-06-28 NOTE — Plan of Care (Signed)
°  Problem: Clinical Measurements: Goal: Diagnostic test results will improve Outcome: Progressing Goal: Respiratory complications will improve Outcome: Progressing   Problem: Clinical Measurements: Goal: Respiratory complications will improve Outcome: Progressing   Problem: Activity: Goal: Risk for activity intolerance will decrease Outcome: Progressing   Problem: Nutrition: Goal: Adequate nutrition will be maintained Outcome: Progressing

## 2021-06-28 NOTE — Discharge Summary (Signed)
Casey Brennan:[id number] DOB: [birth year] DOA: 06/23/2021  PCP: Biagio Borg, MD  Admit date: 06/23/2021  Discharge date: 06/28/2021  Admitted From: Home   Disposition:  Home   Recommendations for Outpatient Follow-up:   Follow up with PCP in 1-2 weeks  PCP Please obtain BMP/CBC, 2 view CXR in 1week,  (see Discharge instructions)   PCP Please follow up on the following pending results: Kindly monitor CBC, CMP, blood pressure, review renal ultrasound and CT scan reports in detail.  Outpatient monitoring of adrenal nodule and renal cyst.   Home Health: None   Equipment/Devices: None  Consultations: ID Discharge Condition: Stable    CODE STATUS: Full    Diet Recommendation: Heart Healthy   Diet Order             Diet - low sodium heart healthy           Diet regular Room service appropriate? Yes; Fluid consistency: Thin  Diet effective now                    Chief Complaint  Patient presents with   Emesis     Brief history of present illness from the day of admission and additional interim summary    Casey Brennan is a 60 y.o. male with medical history significant of HIV; HTN; and prostate CA presenting with meningitis, sent by PCP.  He was seen in the ER on 12/19 for fever, myalgias, headache.   He had a spinal fusion on 3/30 ([facility]). 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   He had some left arm pain as well, dull and to the hand.  His L 2-4 fingers are numb.  He had a nerve block with Dr. Ron Agee and he felt improvement in headache until that worse off.  Headaches recurred and he felt like he was spinning.  Some green mucus and coughing up green sputum last Tuesday, fever to 101.5.  Fever waxed and waned,  he finally presented to the ER where work-up including LP was suggestive of viral meningitis he was seen by ID and admitted to the hospital.                                                                 Hospital Course    Viral meningitis.  MRI brain nonacute, LP noted, CSF is negative for HSV PCR  & VZV PCR , was seen by ID initially given acyclovir but once his PCR's were negative Acyclovir was stopped on 06/27/2021 with continued improvement in headache, no photophobia or neck stiffness now Case discussed with ID physician Dr. Graylon Good, likely has aseptic meningitis from undetected virus.  Will be discharged  with outpatient ID follow-up.  Symptoms much improved.   2.  History of HIV.  In good control, ID following continue Biktarvy.   3.  History of spine surgery by Dr. Lynann Bologna.  Appears stable.   4.  Dyslipidemia.  Outpatient follow-up with PCP.   5.  COPD.  Stable no acute issues supportive care.   6.  History of prostate cancer.  No acute issues.   7.  CKD 3A.  Baseline creatinine around 1.3.  Monitor, stable UA and renal ultrasound   8.  Constipation.  Placed on bowel regimen.  9. Incidental  finding of renal cyst and adrenal adenoma.  Outpatient work-up and monitoring per PCP.    Discharge diagnosis     Principal Problem:   Viral meningitis, unspecified Active Problems:   HIV infection ([place])   Primary hypertension   H/O prostate cancer   COPD GOLD III   Chronic renal insufficiency, stage 2 (mild)   Mixed hyperlipidemia    Discharge instructions    Discharge Instructions     Diet - low sodium heart healthy   Complete by: As directed    Discharge instructions   Complete by: As directed    Follow with Primary MD Biagio Borg, MD and your ID physician in 7 days   Get CBC, CMP, 2 view Chest X ray -  checked next visit within 1 week by Primary MD    Activity: As tolerated with Full fall precautions use walker/cane & assistance as needed  Disposition Home     Diet: Heart Healthy    Special Instructions: If you have smoked or chewed Tobacco  in the last 2 yrs please stop smoking, stop any regular Alcohol  and or any Recreational drug use.  On your next visit with your primary care physician please Get Medicines reviewed and adjusted.  Please request your Prim.MD to go over all Hospital Tests and Procedure/Radiological results at the follow up, please get all Hospital records sent to your Prim MD by signing hospital release before you go home.  If you experience worsening of your admission symptoms, develop shortness of breath, life threatening emergency, suicidal or homicidal thoughts you must seek medical attention immediately by calling 911 or calling your MD immediately  if symptoms less severe.  You Must read complete instructions/literature along with all the possible adverse reactions/side effects for all the Medicines you take and that have been prescribed to you. Take any new Medicines after you have completely understood and accpet all the possible adverse reactions/side effects.   Increase activity slowly   Complete by: As directed    No wound care   Complete by: As directed        Discharge Medications   Allergies as of 06/28/2021       Reactions   Amoxapine Shortness Of Breath   Chocolate Shortness Of Breath, Other (See Comments)   Asthma attack, welts   Descovy [emtricitabine-tenofovir Af] Shortness Of Breath   Nasuea, vomiiting, diarrhea, sob, whelps, throat closes   Genvoya [elviteg-cobic-emtricit-tenofaf] Shortness Of Breath   Nausea, vomitting, diarreha, sob, rash, throat closing   Morphine And Related Shortness Of Breath, Itching, Other (See Comments)    chest pain   Oxycodone-acetaminophen Shortness Of Breath   Penicillins Shortness Of Breath, Rash   Has patient had a PCN reaction causing immediate rash, facial/tongue/throat swelling, SOB or lightheadedness with hypotension: Yes Has patient had a PCN reaction  causing severe rash involving mucus membranes or skin necrosis: No Has  patient had a PCN reaction that required hospitalization No Has patient had a PCN reaction occurring within the last 10 years: Yes If all of the above answers are "NO", then may proceed with Cephalosporin use.   Sulfa Antibiotics Anaphylaxis, Other (See Comments)   Tomato Shortness Of Breath   Vancomycin Shortness Of Breath, Other (See Comments)   Darunavir Itching   Carvedilol    LEG SWELLING, DIZZNESS   Flagyl [metronidazole] Itching   Milk-related Compounds    Morphine Other (See Comments)   Penicillin G Other (See Comments)   Toradol [ketorolac Tromethamine] Nausea And Vomiting   itching   Acetaminophen Rash, Other (See Comments)   Pt tolerates percocet and also plain APAP without rash   Tramadol Itching, Nausea And Vomiting, Other (See Comments)        Medication List     STOP taking these medications    chlorpheniramine-HYDROcodone 10-8 MG/5ML Suer Commonly known as: TUSSIONEX   gabapentin 300 MG capsule Commonly known as: NEURONTIN   hydrALAZINE 50 MG tablet Commonly known as: APRESOLINE   levofloxacin 750 MG tablet Commonly known as: LEVAQUIN   metoprolol succinate 100 MG 24 hr tablet Commonly known as: TOPROL-XL   naproxen sodium 220 MG tablet Commonly known as: ALEVE       TAKE these medications    albuterol (2.5 MG/3ML) 0.083% nebulizer solution Commonly known as: PROVENTIL Take 3 mLs (2.5 mg total) by nebulization every 6 (six) hours as needed for wheezing or shortness of breath.   albuterol 108 (90 Base) MCG/ACT inhaler Commonly known as: Ventolin HFA Inhale 2 puffs into the lungs every 6 (six) hours as needed for wheezing or shortness of breath.   ALPRAZolam 0.5 MG tablet Commonly known as: XANAX TAKE 1 TABLET(0.5 MG) BY MOUTH TWICE DAILY AS NEEDED FOR SLEEP What changed: See the new instructions.   Biktarvy 50-200-25 MG Tabs tablet Generic drug:  bictegravir-emtricitabine-tenofovir AF Take 1 tablet by mouth daily.   Breztri Aerosphere 160-9-4.8 MCG/ACT Aero Generic drug: Budeson-Glycopyrrol-Formoterol Inhale 2 puffs into the lungs in the morning and at bedtime.   cetirizine 10 MG tablet Commonly known as: ZYRTEC Take 1 tablet (10 mg total) by mouth daily.   cyanocobalamin 1000 MCG/ML injection Commonly known as: (VITAMIN B-12) Inject 1 mL (1,000 mcg total) into the muscle every 30 (thirty) days.   diphenhydrAMINE 25 MG tablet Commonly known as: BENADRYL Take 1 tablet (25 mg total) by mouth every 6 (six) hours as needed. What changed: reasons to take this   docusate sodium 100 MG capsule Commonly known as: COLACE Take 2 capsules (200 mg total) by mouth 2 (two) times daily.   Dulcolax 5 MG EC tablet Generic drug: bisacodyl Take 2 tablets (10 mg total) by mouth daily as needed for moderate constipation.   EPINEPHrine 0.3 mg/0.3 mL Soaj injection Commonly known as: EPI-PEN Inject 0.3 mg into the muscle as needed for anaphylaxis.   HYDROcodone-acetaminophen 5-325 MG tablet Commonly known as: NORCO/VICODIN Take 1 tablet by mouth every 8 (eight) hours as needed for moderate pain or severe pain. What changed:  how much to take when to take this   hydrOXYzine 25 MG tablet Commonly known as: ATARAX Take 1 tablet (25 mg total) by mouth at bedtime.   methocarbamol 500 MG tablet Commonly known as: ROBAXIN Take 500 mg by mouth 4 (four) times daily.   metoprolol tartrate 25 MG tablet Commonly known as: LOPRESSOR Take 1 tablet (25 mg total) by mouth 2 (two) times daily.  montelukast 10 MG tablet Commonly known as: SINGULAIR Take 10 mg by mouth daily.   MOVE FREE JOINT HEALTH ADVANCE PO Take 1 tablet by mouth daily.   multivitamin with minerals Tabs tablet Take 1 tablet by mouth daily after breakfast.   mupirocin ointment 2 % Commonly known as: BACTROBAN APPLY EXTERNALLY TO THE AFFECTED AREA TWICE DAILY FOR 3  WEEKS, PLACE COTTON BETWEEN TOES TO ALLOW BETTER AERATION AS NEEDED What changed: See the new instructions.   omeprazole 40 MG capsule Commonly known as: PRILOSEC Take 1 capsule (40 mg total) by mouth 2 (two) times daily.   ondansetron 4 MG disintegrating tablet Commonly known as: ZOFRAN-ODT Take 1 tablet (4 mg total) by mouth every 4 (four) hours as needed for nausea or vomiting.   OVER THE COUNTER MEDICATION Take 2 capsules by mouth daily. Zhou: thyroid supplement   polyethylene glycol 17 g packet Commonly known as: MIRALAX / GLYCOLAX Take 17 g by mouth daily.   Qnasl 80 MCG/ACT Aers Generic drug: Beclomethasone Dipropionate Place 2 sprays into the nose daily.   tizanidine 6 MG capsule Commonly known as: Zanaflex Take 1 capsule (6 mg total) by mouth 3 (three) times daily.   torsemide 20 MG tablet Commonly known as: DEMADEX Take 10 mg by mouth See admin instructions. Tuesday Thursday Saturday   Ubrelvy 50 MG Tabs Generic drug: Ubrogepant Take 1 tablet at the onset of migraine can repeat in 2 hours if needed.   Vitamin D3 250 MCG (10000 UT) capsule Take 1 tablet by mouth daily. [employer]         Follow-up Information     Biagio Borg, MD. Schedule an appointment as soon as possible for a visit in 1 week(s).   Specialties: Internal Medicine, Radiology Contact information: [place] [address] [phone]         Jabier Mutton, MD. Schedule an appointment as soon as possible for a visit in 1 week(s).   Specialty: Infectious Diseases Contact information: [address] [place] [address] [phone]                 Major procedures and Radiology Reports - PLEASE review detailed and final reports thoroughly  -       CT Head Wo Contrast  Result Date: 06/20/2021 CLINICAL DATA:  Headaches and neck pain, initial encounter EXAM: CT HEAD WITHOUT CONTRAST CT CERVICAL SPINE WITHOUT CONTRAST TECHNIQUE: Multidetector  CT imaging of the head and cervical spine was performed following the standard protocol without intravenous contrast. Multiplanar CT image reconstructions of the cervical spine were also generated. COMPARISON:  None. FINDINGS: CT HEAD FINDINGS Brain: No evidence of acute infarction, hemorrhage, hydrocephalus, extra-axial collection or mass lesion/mass effect. Vascular: No hyperdense vessel or unexpected calcification. Skull: Normal. Negative for fracture or focal lesion. Sinuses/Orbits: No acute finding. Other: None. CT CERVICAL SPINE FINDINGS Alignment: Within normal limits. Skull base and vertebrae: Surgical changes are noted from C3-C6 with interbody fusion and anterior fixation. Multilevel osteophytic changes are seen. Facet hypertrophic changes are noted. No acute fracture or acute facet abnormality is noted. The odontoid is within normal limits. Soft tissues and spinal canal: Surrounding soft tissue structures are within normal limits. Upper chest: Visualized lung apices are within normal limits. Other: None IMPRESSION: CT of the head: No acute intracranial abnormality noted. CT of the cervical spine: Postsurgical changes from C3-C6. No acute abnormality noted. Electronically Signed   By: Linus Mako.D.  On: 06/20/2021 21:32   CT Cervical Spine Wo Contrast  Result Date: 06/20/2021 CLINICAL DATA:  Headaches and neck pain, initial encounter EXAM: CT HEAD WITHOUT CONTRAST CT CERVICAL SPINE WITHOUT CONTRAST TECHNIQUE: Multidetector CT imaging of the head and cervical spine was performed following the standard protocol without intravenous contrast. Multiplanar CT image reconstructions of the cervical spine were also generated. COMPARISON:  None. FINDINGS: CT HEAD FINDINGS Brain: No evidence of acute infarction, hemorrhage, hydrocephalus, extra-axial collection or mass lesion/mass effect. Vascular: No hyperdense vessel or unexpected calcification. Skull: Normal. Negative for fracture or focal lesion.  Sinuses/Orbits: No acute finding. Other: None. CT CERVICAL SPINE FINDINGS Alignment: Within normal limits. Skull base and vertebrae: Surgical changes are noted from C3-C6 with interbody fusion and anterior fixation. Multilevel osteophytic changes are seen. Facet hypertrophic changes are noted. No acute fracture or acute facet abnormality is noted. The odontoid is within normal limits. Soft tissues and spinal canal: Surrounding soft tissue structures are within normal limits. Upper chest: Visualized lung apices are within normal limits. Other: None IMPRESSION: CT of the head: No acute intracranial abnormality noted. CT of the cervical spine: Postsurgical changes from C3-C6. No acute abnormality noted. Electronically Signed   By: Inez Catalina M.D.   On: 06/20/2021 21:32   MR BRAIN W WO CONTRAST  Result Date: 06/22/2021 CLINICAL DATA:  Headache, febrile, HIV positive EXAM: MRI HEAD WITHOUT AND WITH CONTRAST TECHNIQUE: Multiplanar, multiecho pulse sequences of the brain and surrounding structures were obtained without and with intravenous contrast. CONTRAST:  30mL GADAVIST GADOBUTROL 1 MMOL/ML IV SOLN COMPARISON:  CT head 06/20/2021 FINDINGS: Brain: There is no evidence of acute intracranial hemorrhage, extra-axial fluid collection, or acute infarct. Parenchymal volume is normal. The ventricles are normal in size. There are scattered small foci of FLAIR signal abnormality in the subcortical and periventricular white matter, nonspecific but likely reflecting sequela of mild chronic white matter microangiopathy. There is no suspicious parenchymal signal abnormality. There is no mass lesion or abnormal enhancement. There is no midline shift. Vascular: Normal flow voids. Skull and upper cervical spine: Postsurgical changes are noted in the upper cervical spine. There is no suspicious marrow signal abnormality. Sinuses/Orbits: The paranasal sinuses are clear. Elongation of the globes likely reflects sequela of myopia.  The globes and orbits are otherwise unremarkable. Other: None. IMPRESSION: No acute intracranial pathology.  No abnormal enhancement. Electronically Signed   By: Valetta Mole M.D.   On: 06/22/2021 21:04   CT Abdomen Pelvis W Contrast  Result Date: 06/20/2021 CLINICAL DATA:  Flank pain.  Concern for kidney stone. EXAM: CT ABDOMEN AND PELVIS WITH CONTRAST TECHNIQUE: Multidetector CT imaging of the abdomen and pelvis was performed using the standard protocol following bolus administration of intravenous contrast. CONTRAST:  79mL OMNIPAQUE IOHEXOL 350 MG/ML SOLN COMPARISON:  CT of the abdomen pelvis dated 02/07/2021. FINDINGS: Lower chest: The visualized lung bases are clear. No intra-abdominal free air or free fluid. Hepatobiliary: The liver is unremarkable. No intrahepatic biliary dilatation. Cholecystectomy. Pancreas: Unremarkable. No pancreatic ductal dilatation or surrounding inflammatory changes. Spleen: Normal in size without focal abnormality. Adrenals/Urinary Tract: There is a 2 cm indeterminate right adrenal nodule, previously characterized as adenoma. The left adrenal glands unremarkable. There is no hydronephrosis on either side. There is symmetric enhancement and excretion of contrast by both kidneys. Bilateral renal cysts measure up to 4 cm in the upper pole of the right kidney. Several additional subcentimeter hypodense lesions are too small to characterize. There is no hydronephrosis on either side. There  is symmetric enhancement and excretion of contrast by both kidneys. The visualized ureters and urinary bladder appear unremarkable. Stomach/Bowel: Mild thickened appearance of the rectum neck be related to underdistention. Clinical correlation is recommended to evaluate for possibility of mild proctitis. There is sigmoid diverticulosis without active inflammatory changes. There is no bowel obstruction or active inflammation. The appendix is normal. Vascular/Lymphatic: The abdominal aorta and IVC  are unremarkable. No portal venous gas. There is no adenopathy. Reproductive: Prostatectomy. Other: Small fat containing umbilical and several additional small fat containing midline ventral hernia. No bowel herniation. Musculoskeletal: No acute or significant osseous findings. IMPRESSION: 1. Underdistention of the rectum versus less likely mild proctitis. 2. Sigmoid diverticulosis. No bowel obstruction. Normal appendix. 3. Prostatectomy. Electronically Signed   By: Anner Crete M.D.   On: 06/20/2021 21:35   US RENAL  Result Date: 06/25/2021 CLINICAL DATA:  Acute kidney injury EXAM: RENAL / URINARY TRACT ULTRASOUND COMPLETE COMPARISON:  CT 06/20/2021 FINDINGS: Right Kidney: Renal measurements: 11.7 x 6.6 x 6.3 cm = volume: 254.2 mL. Echogenicity within normal limits. No hydronephrosis. Multiple, fewer than 10 cysts within the right kidney. The largest is seen at the upper pole and measures 37 mm. Left Kidney: Renal measurements: 12.3 x 6.3 x 6 cm = volume: 241.9 mL. Echogenicity within normal limits. No hydronephrosis. Cysts within the left kidney. The largest is seen at the mid to lower pole and measures 29 mm. Bladder: Appears normal for degree of bladder distention. Other: Small solid nodule in the region of right adrenal gland measuring 1.9 x 1.9 x 1.6 cm, thought to represent adenoma on CT IMPRESSION: 1. Simple appearing renal cysts.  No hydronephrosis. 2. 19 mm solid nodule in the region of right adrenal gland corresponding to presumed adenoma on CT. Electronically Signed   By: Donavan Foil M.D.   On: 06/25/2021 17:38   DG Chest Port 1 View  Result Date: 06/26/2021 CLINICAL DATA:  Viral meningitis, shortness of breath EXAM: PORTABLE CHEST 1 VIEW COMPARISON:  06/23/2021 FINDINGS: Heart is normal size. Minimal right base atelectasis. Left lung clear. No effusions. No acute bony abnormality. IMPRESSION: Minimal right base atelectasis. Electronically Signed   By: Rolm Baptise M.D.   On: 06/26/2021  08:41   DG Chest Port 1 View  Result Date: 06/23/2021 CLINICAL DATA:  Questionable sepsis. EXAM: PORTABLE CHEST 1 VIEW COMPARISON:  None. FINDINGS: Normal mediastinum and cardiac silhouette. Normal pulmonary vasculature. No evidence of effusion, infiltrate, or pneumothorax. No acute bony abnormality. Anterior cervical fusion IMPRESSION: No acute cardiopulmonary process. Electronically Signed   By: Suzy Bouchard M.D.   On: 06/23/2021 07:47   DG Chest Port 1 View  Result Date: 06/20/2021 CLINICAL DATA:  Questionable sepsis. EXAM: PORTABLE CHEST 1 VIEW COMPARISON:  Chest x-ray 04/05/2021. FINDINGS: The heart size and mediastinal contours are within normal limits. Both lungs are clear. The visualized skeletal structures are unremarkable. IMPRESSION: No active disease. Electronically Signed   By: Ronney Asters M.D.   On: 06/20/2021 19:17   DG FL GUIDED LUMBAR PUNCTURE  Result Date: 06/23/2021 CLINICAL DATA:  Provided history: Fever. EXAM: DIAGNOSTIC LUMBAR PUNCTURE UNDER FLUOROSCOPIC GUIDANCE COMPARISON:  Lumbar spine radiographs 02/01/2014. MRI of the lumbar spine 06/02/2013. FLUOROSCOPY TIME:  Fluoroscopy Time:  2 minutes, 48 seconds Radiation Exposure Index (if provided by the fluoroscopic device): 23.40 mGy Number of Acquired Spot Images: 2 PROCEDURE: Candiss Norse, PA obtained informed consent from the patient prior to the procedure, with a discussion of potential procedure risks, benefits and  alternatives. The procedure was performed by Candiss Norse, PA. I was briefly called to the fluoroscopy suite for assistance. With the patient prone, the lower back was prepped with Betadine. 1% Lidocaine was used for local anesthesia. Lumbar puncture was performed at the L4-L5 level using a 20 gauge needle with return of clear CSF. 11 ml of CSF were obtained for laboratory studies. The patient tolerated the procedure well. No immediate post-procedure complication was apparent. IMPRESSION: Technically  successful fluoroscopically-guided lumbar puncture at L4-L5. 11 mL of CSF obtained for laboratory studies. Electronically Signed   By: Kellie Simmering D.O.   On: 06/23/2021 10:36      Today   Subjective    Casey Brennan today has , minimal headache,no chest abdominal pain,no new weakness tingling or numbness, feels much better wants to go home today.     Objective   Blood pressure (!) 135/99, pulse 84, temperature 98.7 F (37.1 C), temperature source Oral, resp. rate 19, height 5\' 9"  (1.753 m), weight 94.8 kg, SpO2 93 %.   Intake/Output Summary (Last 24 hours) at 06/28/2021 1039 Last data filed at 06/28/2021 0234 Gross per 24 hour  Intake --  Output 400 ml  Net -400 ml    Exam  Awake Alert, No new F.N deficits, supple neck, negative Kernig and Brezinski sign [place].AT,  Supple Neck,  No new Rash or bruise   Data Review   CBC w Diff:  Lab Results  Component Value Date   WBC 5.8 06/28/2021   HGB 12.9 (L) 06/28/2021   HGB 14.0 05/11/2015   HCT 38.0 (L) 06/28/2021   HCT 42.1 05/11/2015   PLT 206 06/28/2021   PLT 126 (L) 05/11/2015   LYMPHOPCT 44 06/28/2021   LYMPHOPCT 33.0 05/11/2015   BANDSPCT 0 09/19/2010   MONOPCT 7 06/28/2021   MONOPCT 6.2 05/11/2015   EOSPCT 4 06/28/2021   EOSPCT 11.8 (H) 05/11/2015   BASOPCT 1 06/28/2021   BASOPCT 0.9 05/11/2015    CMP:  Lab Results  Component Value Date   NA 135 06/28/2021   NA 142 02/24/2013   K 3.6 06/28/2021   K 3.4 (L) 02/24/2013   CL 103 06/28/2021   CO2 25 06/28/2021   CO2 24 02/24/2013   BUN 8 06/28/2021   BUN 11.9 02/24/2013   CREATININE 1.31 (H) 06/28/2021   CREATININE 1.24 11/11/2020   CREATININE 1.3 02/24/2013   PROT 6.3 (L) 06/28/2021   PROT 7.1 02/24/2013   ALBUMIN 3.4 (L) 06/28/2021   ALBUMIN 3.6 02/24/2013   BILITOT 0.8 06/28/2021   BILITOT 1.40 (H) 02/24/2013   ALKPHOS 79 06/28/2021   ALKPHOS 94 02/24/2013   AST 16 06/28/2021   AST 14 02/24/2013   ALT 16 06/28/2021   ALT 11 02/24/2013   .   Total Time in preparing paper work, data evaluation and todays exam - 53 minutes  [name] M.D on 06/28/2021 at 10:39 AM  [facility]

## 2021-06-28 NOTE — Discharge Instructions (Signed)
Follow with Primary MD Biagio Borg, MD and your ID physician in 7 days   Get CBC, CMP, 2 view Chest X ray -  checked next visit within 1 week by Primary MD    Activity: As tolerated with Full fall precautions use walker/cane & assistance as needed  Disposition Home    Diet: Heart Healthy    Special Instructions: If you have smoked or chewed Tobacco  in the last 2 yrs please stop smoking, stop any regular Alcohol  and or any Recreational drug use.  On your next visit with your primary care physician please Get Medicines reviewed and adjusted.  Please request your Prim.MD to go over all Hospital Tests and Procedure/Radiological results at the follow up, please get all Hospital records sent to your Prim MD by signing hospital release before you go home.  If you experience worsening of your admission symptoms, develop shortness of breath, life threatening emergency, suicidal or homicidal thoughts you must seek medical attention immediately by calling 911 or calling your MD immediately  if symptoms less severe.  You Must read complete instructions/literature along with all the possible adverse reactions/side effects for all the Medicines you take and that have been prescribed to you. Take any new Medicines after you have completely understood and accpet all the possible adverse reactions/side effects.                                                                                [name]                            [address] [place], [place] [address]      Casey Brennan was admitted to the Hospital on 06/23/2021 and Discharged  06/28/2021 and should be excused from work/school   for 21  days starting from date -  06/23/2021 , may return to work/school without any restrictions.  Call [name] MD, [facility]  [phone] with questions.  [name] M.D on 06/28/2021,at 10:29 AM  [facility]   Office  [phone]

## 2021-06-29 ENCOUNTER — Encounter: Payer: Self-pay | Admitting: Internal Medicine

## 2021-06-29 ENCOUNTER — Ambulatory Visit: Payer: Medicare Other | Admitting: Student

## 2021-06-29 ENCOUNTER — Ambulatory Visit: Payer: PRIVATE HEALTH INSURANCE | Admitting: Internal Medicine

## 2021-06-29 ENCOUNTER — Encounter: Payer: Self-pay | Admitting: Student

## 2021-06-29 ENCOUNTER — Ambulatory Visit (INDEPENDENT_AMBULATORY_CARE_PROVIDER_SITE_OTHER): Payer: Medicare Other | Admitting: Internal Medicine

## 2021-06-29 ENCOUNTER — Other Ambulatory Visit: Payer: Self-pay

## 2021-06-29 VITALS — BP 119/89 | HR 107 | Temp 98.2°F | Ht 69.0 in | Wt 205.0 lb

## 2021-06-29 VITALS — BP 130/85 | HR 102 | Temp 98.9°F

## 2021-06-29 DIAGNOSIS — Z113 Encounter for screening for infections with a predominantly sexual mode of transmission: Secondary | ICD-10-CM | POA: Diagnosis not present

## 2021-06-29 DIAGNOSIS — I10 Essential (primary) hypertension: Secondary | ICD-10-CM

## 2021-06-29 DIAGNOSIS — Z79899 Other long term (current) drug therapy: Secondary | ICD-10-CM | POA: Insufficient documentation

## 2021-06-29 DIAGNOSIS — B20 Human immunodeficiency virus [HIV] disease: Secondary | ICD-10-CM | POA: Diagnosis not present

## 2021-06-29 DIAGNOSIS — I43 Cardiomyopathy in diseases classified elsewhere: Secondary | ICD-10-CM | POA: Diagnosis not present

## 2021-06-29 DIAGNOSIS — A879 Viral meningitis, unspecified: Secondary | ICD-10-CM | POA: Diagnosis not present

## 2021-06-29 DIAGNOSIS — Z8661 Personal history of infections of the central nervous system: Secondary | ICD-10-CM | POA: Diagnosis not present

## 2021-06-29 DIAGNOSIS — Z21 Asymptomatic human immunodeficiency virus [HIV] infection status: Secondary | ICD-10-CM

## 2021-06-29 DIAGNOSIS — R110 Nausea: Secondary | ICD-10-CM | POA: Diagnosis not present

## 2021-06-29 DIAGNOSIS — I119 Hypertensive heart disease without heart failure: Secondary | ICD-10-CM | POA: Diagnosis not present

## 2021-06-29 MED ORDER — METOPROLOL TARTRATE 25 MG PO TABS: 12.5000 mg | ORAL_TABLET | Freq: Two times a day (BID) | ORAL | 0 refills | Status: DC

## 2021-06-29 MED ORDER — BIKTARVY 50-200-25 MG PO TABS
1.0000 | ORAL_TABLET | Freq: Every day | ORAL | 11 refills | Status: DC
Start: 2021-06-29 — End: 2022-08-02

## 2021-06-29 MED ORDER — ONDANSETRON 4 MG PO TBDP: 4.0000 mg | ORAL_TABLET | Freq: Three times a day (TID) | ORAL | 2 refills | Status: DC | PRN

## 2021-06-29 NOTE — Progress Notes (Signed)
Patient referred by Biagio Borg, MD for hypertension  Subjective:   Casey Brennan, male    DOB: 1960/10/04, 60 y.o.   MRN: [id number]   Chief Complaint  Patient presents with   Hypertension   Follow-up   Hospitalization Follow-up   Loss of Consciousness    60 y.o. male with hypertension, hyperlipidemia, HIV.  Patient was originally scheduled for today's office visit as a 2-week follow-up of hypertension.  However in the interim he was admitted 06/23/2021 - 06/28/2021 with likely aseptic meningitis.  Due to hypotension during hospitalization patient's hydralazine was discontinued and he was switched from Toprol-XL 100 mg daily to Lopressor 25 mg twice daily.  Patient is accompanied by his sister at today's office visit.  Primary concern today is fatigue.   During today's office visit patient's blood pressure is soft and his heart rate is mildly elevated.  Notably he has not taken Lopressor since discharge yesterday.  He is scheduled for follow-up with infectious disease later today.  Initial consultation HPI 05/2020: Patient was recently seen by [facility] Dr. Eleonore Chiquito.  Prior to that, he was also seen by Drs. Harwani and [place].  His prior cardiac work-up showed coronary CTA with normal coronary arteries and calcium score 0.  He has not tolerated several antihypertensive medications in the past, including amlodipine, nifedipine, HCTZ, losartan, carvedilol, labetalol. He is tolerating metoprolol succinate.  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 He uses Aspirin 325 mg on M,W,F during his trips.    Current Outpatient Medications on File Prior to Visit  Medication Sig Dispense Refill   albuterol (PROVENTIL) (2.5 MG/3ML) 0.083% nebulizer solution Take 3 mLs (2.5 mg total) by nebulization every 6 (six) hours as needed for wheezing or shortness of breath. 75 mL 3   albuterol (VENTOLIN HFA) 108 (90 Base) MCG/ACT inhaler Inhale 2  puffs into the lungs every 6 (six) hours as needed for wheezing or shortness of breath. 36 g 3   ALPRAZolam (XANAX) 0.5 MG tablet TAKE 1 TABLET(0.5 MG) BY MOUTH TWICE DAILY AS NEEDED FOR SLEEP (Patient taking differently: Take 0.5 mg by mouth 2 (two) times daily as needed for sleep.) 180 tablet 0   Beclomethasone Dipropionate (QNASL) 80 MCG/ACT AERS Place 2 sprays into the nose daily. 10.6 g 2   bisacodyl (DULCOLAX) 5 MG EC tablet Take 2 tablets (10 mg total) by mouth daily as needed for moderate constipation. 10 tablet 0   Budeson-Glycopyrrol-Formoterol (BREZTRI AEROSPHERE) 160-9-4.8 MCG/ACT AERO Inhale 2 puffs into the lungs in the morning and at bedtime. 10.7 g 5   cetirizine (ZYRTEC) 10 MG tablet Take 1 tablet (10 mg total) by mouth daily. 90 tablet 3   Cholecalciferol (VITAMIN D3) 250 MCG (10000 UT) capsule Take 1 tablet by mouth daily. Nature's Made     cyanocobalamin (,VITAMIN B-12,) 1000 MCG/ML injection Inject 1 mL (1,000 mcg total) into the muscle every 30 (thirty) days. 3 mL 3   diphenhydrAMINE (BENADRYL) 25 MG tablet Take 1 tablet (25 mg total) by mouth every 6 (six) hours as needed. (Patient taking differently: Take 25 mg by mouth every 6 (six) hours as needed for sleep, itching or allergies.) 30 tablet 0   docusate sodium (COLACE) 100 MG capsule Take 2 capsules (200 mg total) by mouth 2 (two) times daily. 14 capsule 0   EPINEPHrine 0.3 mg/0.3 mL IJ SOAJ injection Inject 0.3 mg into the muscle as needed for anaphylaxis. 1  each 5   Glucos-Chond-Hyal Ac-Ca Fructo (MOVE FREE JOINT HEALTH ADVANCE PO) Take 1 tablet by mouth daily.     HYDROcodone-acetaminophen (NORCO/VICODIN) 5-325 MG tablet Take 1 tablet by mouth every 8 (eight) hours as needed for moderate pain or severe pain. 20 tablet 0   hydrOXYzine (ATARAX/VISTARIL) 25 MG tablet Take 1 tablet (25 mg total) by mouth at bedtime. 90 tablet 3   methocarbamol (ROBAXIN) 500 MG tablet Take 500 mg by mouth 4 (four) times daily.      montelukast (SINGULAIR) 10 MG tablet Take 10 mg by mouth daily.     Multiple Vitamin (MULTIVITAMIN WITH MINERALS) TABS tablet Take 1 tablet by mouth daily after breakfast.      mupirocin ointment (BACTROBAN) 2 % APPLY EXTERNALLY TO THE AFFECTED AREA TWICE DAILY FOR 3 WEEKS, PLACE COTTON BETWEEN TOES TO ALLOW BETTER AERATION AS NEEDED (Patient taking differently: Apply 1 application topically See admin instructions. APPLY EXTERNALLY TO THE AFFECTED AREA TWICE DAILY FOR 3 WEEKS, PLACE COTTON BETWEEN TOES TO ALLOW BETTER AERATION AS NEEDED) 66 g 1   omeprazole (PRILOSEC) 40 MG capsule Take 1 capsule (40 mg total) by mouth 2 (two) times daily. 180 capsule 2   OVER THE COUNTER MEDICATION Take 2 capsules by mouth daily. Zhou: thyroid supplement     polyethylene glycol (MIRALAX / GLYCOLAX) 17 g packet Take 17 g by mouth daily. 7 each 0   tizanidine (ZANAFLEX) 6 MG capsule TAKE 1 CAPSULE(6 MG) BY MOUTH THREE TIMES DAILY 90 capsule 11   torsemide (DEMADEX) 20 MG tablet Take 10 mg by mouth See admin instructions. Tuesday Thursday Saturday     Ubrogepant (UBRELVY) 50 MG TABS Take 1 tablet at the onset of migraine can repeat in 2 hours if needed. 15 tablet 3   [DISCONTINUED] zolpidem (AMBIEN) 5 MG tablet Take 1-2 tablets (5-10 mg total) by mouth at bedtime as needed for sleep. (Patient not taking: Reported on 04/23/2019) 30 tablet 2   No current facility-administered medications on file prior to visit.    Cardiovascular and other pertinent studies: EKG 06/29/2021:  Sinus rhythm with single PVC at a rate of 107 bpm.  Normal axis.  Left atrial enlargement.  No evidence of ischemia or underlying injury pattern.  EKG 06/15/2021: Sinus rhythm 82 bpm Occasional PAC    Left atrial enlargement  Nonspecific T-abnormality  Echocardiogram 05/06/2020:  Moderately depressed LV systolic function with EF 37%. Left ventricle  cavity is normal in size. Moderate concentric hypertrophy of the left   ventricle. Hypokinetic global wall motion. Doppler evidence of grade I  (impaired) diastolic dysfunction, normal LAP. Calculated EF 37%.  Structurally normal mitral valve.  Mild (Grade I) mitral regurgitation.  Mild pulmonic regurgitation.   CCTA 04/07/2020: 1. Coronary calcium score of 0. 2. Normal coronary origin with right dominance. 3. Normal coronary arteries. RECOMMENDATIONS: 1. No evidence of CAD (0%). Consider non-atherosclerotic causes of chest pain.   Recent labs: CMP Latest Ref Rng & Units 06/28/2021 06/27/2021 06/26/2021  Glucose 70 - 99 mg/dL 95 88 103(H)  BUN 6 - 20 mg/dL 8 6 5(L)  Creatinine 0.61 - 1.24 mg/dL 1.31(H) 1.18 1.29(H)  Sodium 135 - 145 mmol/L 135 137 136  Potassium 3.5 - 5.1 mmol/L 3.6 3.9 3.4(L)  Chloride 98 - 111 mmol/L 103 105 105  CO2 22 - 32 mmol/L 25 22 23   Calcium 8.9 - 10.3 mg/dL 8.9 8.8(L) 8.8(L)  Total Protein 6.5 - 8.1 g/dL 6.3(L) 5.7(L) 6.0(L)  Total Bilirubin 0.3 -  1.2 mg/dL 0.8 0.8 1.0  Alkaline Phos 38 - 126 U/L 79 66 79  AST 15 - 41 U/L 16 17 19   ALT 0 - 44 U/L 16 14 16    CBC Latest Ref Rng & Units 06/28/2021 06/27/2021 06/26/2021  WBC 4.0 - 10.5 K/uL 5.8 4.8 5.1  Hemoglobin 13.0 - 17.0 g/dL 12.9(L) 12.3(L) 13.2  Hematocrit 39.0 - 52.0 % 38.0(L) 35.5(L) 37.4(L)  Platelets 150 - 400 K/uL 206 147(L) 166   Lipid Panel     Component Value Date/Time   CHOL 183 12/27/2020 0938   CHOL 163 05/25/2020 1432   TRIG 133.0 12/27/2020 0938   HDL 33.10 (L) 12/27/2020 0938   HDL 45 05/25/2020 1432   CHOLHDL 6 12/27/2020 0938   VLDL 26.6 12/27/2020 0938   LDLCALC 123 (H) 12/27/2020 0938   LDLCALC 97 05/25/2020 1432   LDLCALC 82 03/11/2020 1540   HEMOGLOBIN A1C Lab Results  Component Value Date   HGBA1C (L) 10/10/2010    4.3 (NOTE)                                                                       According to the ADA Clinical Practice Recommendations for 2011, when HbA1c is used as a screening test:   >=6.5%   Diagnostic of  Diabetes Mellitus           (if abnormal result  is confirmed)  5.7-6.4%   Increased risk of developing Diabetes Mellitus  References:Diagnosis and Classification of Diabetes Mellitus,Diabetes MWNU,2725,36(UYQIH 1):S62-S69 and Standards of Medical Care in         Diabetes - 2011,Diabetes Care,2011,34  (Suppl 1):S11-S61.   MPG 77 10/10/2010   TSH Recent Labs    12/27/20 0938  TSH 1.63   Other Labs: 09/27/2020: Glucose 95, BUN/Cr 13/1.47. EGFR 55. Na/K 139/3.9. Rest of the CMP normal H/H 13/41. MCV 95. Platelets 162 HbA1C N/A% Chol 163, TG 114, HDL 45, LDL 97 (05/2020) TSH 3.2 normal  03/11/2020: Glucose 89.  BUN/creatinine 10/1.19.  GFR N/A. Na/K 139/3.9.  Rest of the CMP normal. H/H 14/43. MCV 96.  Platelets 169. Cholesterol 153, triglycerides 92, HDL 52, LDL 82.    Review of Systems  Constitutional: Positive for malaise/fatigue.  Cardiovascular:  Positive for dyspnea on exertion (Mild). Negative for chest pain, leg swelling, palpitations and syncope.  Neurological:  Positive for dizziness (room spinning sensation).        Vitals:   06/29/21 1347 06/29/21 1348  BP:    Pulse:    Temp:    SpO2: 95% 94%    Body mass index is 30.27 kg/m. Filed Weights   06/29/21 1339  Weight: 205 lb (93 kg)   Orthostatic VS for the past 72 hrs (Last 3 readings):  Orthostatic BP Patient Position BP Location Cuff Size Orthostatic Pulse  06/29/21 1348 120/87 Standing Left Arm Normal 116  06/29/21 1347 (!) 118/91 Sitting Left Arm Normal 105  06/29/21 1346 109/87 Supine Left Arm -- 84  06/29/21 1339 -- Sitting Left Arm Normal --   Objective:   Physical Exam Vitals reviewed.  Constitutional:      General: He is not in acute distress.    Comments: Appears older than stated age  HENT:  Head: Normocephalic and atraumatic.  Neck:     Vascular: No JVD.  Cardiovascular:     Rate and Rhythm: Regular rhythm. Tachycardia present.     Pulses: Intact distal pulses.     Heart sounds:  Normal heart sounds, S1 normal and S2 normal. No murmur heard.   No gallop.  Pulmonary:     Effort: Pulmonary effort is normal. No respiratory distress.     Breath sounds: Normal breath sounds. No wheezing, rhonchi or rales.  Musculoskeletal:     Right lower leg: No edema.     Left lower leg: No edema.  Neurological:     Mental Status: He is alert.      Assessment & Recommendations:    60 y.o. African-American male with hypertension, hyperlipidemia, COPD, HIV.  Hypertension: Patient was recently hospitalized with aseptic meningitis. Hydralazine was discontinued and he was switched from Toprol-XL to Lopressor. Patient's blood pressure is soft in the office today.  Advised patient to take Lopressor 12.5 mg p.o. twice daily given tachycardia.  However advised patient and his sister both to monitor blood pressure closely at home.  Hypertensive cardiomyopathy: Echocardiogram in 05/2021 , EF around 45%. Normal coronaries (CTA 04/2020).    Patient was seen in collaboration with Dr. Virgina Jock.  Particularly given soft blood pressure and elevated heart rate discussed concern for sepsis versus dehydration.  Advised patient to stay well-hydrated and start low-dose beta-blocker.  He is scheduled to follow-up with infectious disease later today, will defer further recommendations to ID provider.  We will plan to follow closely in 2 weeks, sooner if needed.   This was a 50-minute encounter with face-to-face counseling, medical records review, coordination of care, explanation of complex medical issues, complex medical decision making.     Alethia Berthold, PA-C 06/29/2021, 4:42 PM Office: [phone]

## 2021-06-30 ENCOUNTER — Other Ambulatory Visit: Payer: Self-pay | Admitting: Neurology

## 2021-06-30 ENCOUNTER — Telehealth: Payer: Self-pay

## 2021-06-30 ENCOUNTER — Encounter: Payer: Self-pay | Admitting: Internal Medicine

## 2021-06-30 DIAGNOSIS — R252 Cramp and spasm: Secondary | ICD-10-CM

## 2021-06-30 DIAGNOSIS — G250 Essential tremor: Secondary | ICD-10-CM

## 2021-06-30 DIAGNOSIS — R110 Nausea: Secondary | ICD-10-CM | POA: Insufficient documentation

## 2021-06-30 DIAGNOSIS — G63 Polyneuropathy in diseases classified elsewhere: Secondary | ICD-10-CM

## 2021-06-30 DIAGNOSIS — G44229 Chronic tension-type headache, not intractable: Secondary | ICD-10-CM

## 2021-06-30 NOTE — Assessment & Plan Note (Signed)
He remains nauseous with his current illness and I have refilled his zofran. I do not anticipate this will be an ongoing need.

## 2021-06-30 NOTE — Assessment & Plan Note (Signed)
Will check his labs today.  He has been compliant.

## 2021-06-30 NOTE — Assessment & Plan Note (Signed)
He is slowly recovering and anticipate a long period to get back to his baseline.  I discussed the need to eat well and increase his activity more as he recuperates.  His sister seems very helpful and will help get him moving.

## 2021-06-30 NOTE — Telephone Encounter (Signed)
Transition Care Management Follow-up Telephone Call Date of discharge and from where: [facility] 06-28-21 Dx viral meningitis  How have you been since you were released from the hospital? Better just not much appetite  Any questions or concerns? No  Items Reviewed: Did the pt receive and understand the discharge instructions provided? Yes  Medications obtained and verified? Yes  Other? No  Any new allergies since your discharge? No  Dietary orders reviewed? Yes Do you have support at home? Yes   Home Care and Equipment/Supplies: Were home health services ordered? no   Has the agency set up a time to come to the patient's home? not applicable Were any new equipment or medical supplies ordered?  No  Were you able to get the supplies/equipment? not applicable Do you have any questions related to the use of the equipment or supplies? No  Functional Questionnaire: (I = Independent and D = Dependent) ADLs: I  Bathing/Dressing- D  Meal Prep- D  Eating- I  Maintaining continence- I  Transferring/Ambulation- D  Managing Meds- D  Follow up appointments reviewed:  PCP Hospital f/u appt confirmed? Yes  Scheduled to see Dr Jenny Reichmann on 07-01-21 @ [facility] f/u appt confirmed? No . Are transportation arrangements needed? No  If their condition worsens, is the pt aware to call PCP or go to the Emergency Dept.? Yes Was the patient provided with contact information for the PCP's office or ED? Yes Was to pt encouraged to call back with questions or concerns? Yes

## 2021-06-30 NOTE — Progress Notes (Signed)
° °  Subjective:    Patient ID: Casey Brennan, male    DOB: 1960-10-26, 60 y.o.   MRN: [id number]  HPI He is here for hsfu.  He was hospitalized last week with headache, photophobia and weakness and had findings c/w viral meningitis with elevated WBCs in the CSF with a lymphocytic predominance and no positive bacterial growth.  He was discharged the day prior to this visit and is continuing to recover.  He continues to have issues with dizziness, is in a wheelchair and fatigued/deconditioned.  He was sick for several weeks prior to the LP.  He continues to be nauseous and asking about zofran refills.  At the visit with his sister Casey Brennan, who is his caregiver.    Review of Systems  Constitutional:  Positive for activity change, appetite change and fatigue. Negative for fever.  Gastrointestinal:  Positive for nausea. Negative for diarrhea.  Skin:  Negative for rash.  Neurological:  Positive for dizziness, weakness and headaches.      Objective:   Physical Exam Constitutional:      Comments: Fatigued appearing  Eyes:     General: No scleral icterus. Pulmonary:     Effort: Pulmonary effort is normal.  Skin:    Findings: No rash.  Neurological:     General: No focal deficit present.     Mental Status: He is alert.  Psychiatric:        Mood and Affect: Mood normal.   SH: no tobacco       Assessment & Plan:

## 2021-07-01 ENCOUNTER — Telehealth: Payer: Self-pay | Admitting: Internal Medicine

## 2021-07-01 ENCOUNTER — Ambulatory Visit: Payer: PRIVATE HEALTH INSURANCE | Admitting: Internal Medicine

## 2021-07-01 ENCOUNTER — Other Ambulatory Visit: Payer: Self-pay | Admitting: Adult Health

## 2021-07-01 DIAGNOSIS — G4700 Insomnia, unspecified: Secondary | ICD-10-CM

## 2021-07-01 LAB — HIV-1 RNA QUANT-NO REFLEX-BLD
HIV 1 RNA Quant: 597 Copies/mL — ABNORMAL HIGH
HIV-1 RNA Quant, Log: 2.78 Log cps/mL — ABNORMAL HIGH

## 2021-07-01 MED ORDER — HYDROCODONE-ACETAMINOPHEN 5-325 MG PO TABS: 1.0000 | ORAL_TABLET | Freq: Three times a day (TID) | ORAL | 0 refills | Status: DC | PRN

## 2021-07-01 NOTE — Telephone Encounter (Signed)
1.Medication Requested: Hydrocodone-acetaminophen   2. Pharmacy (Name, [facility], [place]): [facility]  3. On Med List: yes   4. Last Visit with PCP:   5. Next visit date with PCP: 01.09.2023   Agent: Please be advised that RX refills may take up to 3 business days. We ask that you follow-up with your pharmacy.

## 2021-07-02 ENCOUNTER — Encounter: Payer: Self-pay | Admitting: Neurology

## 2021-07-04 NOTE — Telephone Encounter (Signed)
The patient's HST was already ordered ( just 2 weeks ago, I believe) , but we have not made the appointment for the test yet.  Can we call April back ? - February should be OK.   I will send this message to the sleep lab manager to forward to the appropriate scheduler.  Larey Seat, MD

## 2021-07-08 DIAGNOSIS — M5412 Radiculopathy, cervical region: Secondary | ICD-10-CM | POA: Diagnosis not present

## 2021-07-11 ENCOUNTER — Other Ambulatory Visit: Payer: Self-pay

## 2021-07-11 ENCOUNTER — Encounter: Payer: Self-pay | Admitting: Internal Medicine

## 2021-07-11 ENCOUNTER — Ambulatory Visit (INDEPENDENT_AMBULATORY_CARE_PROVIDER_SITE_OTHER): Payer: PRIVATE HEALTH INSURANCE | Admitting: Internal Medicine

## 2021-07-11 ENCOUNTER — Encounter: Payer: Self-pay | Admitting: Neurology

## 2021-07-11 VITALS — BP 100/60 | HR 91 | Temp 98.5°F | Ht 69.0 in | Wt 206.0 lb

## 2021-07-11 DIAGNOSIS — I10 Essential (primary) hypertension: Secondary | ICD-10-CM | POA: Diagnosis not present

## 2021-07-11 DIAGNOSIS — E782 Mixed hyperlipidemia: Secondary | ICD-10-CM

## 2021-07-11 DIAGNOSIS — N182 Chronic kidney disease, stage 2 (mild): Secondary | ICD-10-CM | POA: Diagnosis not present

## 2021-07-11 DIAGNOSIS — E559 Vitamin D deficiency, unspecified: Secondary | ICD-10-CM

## 2021-07-11 DIAGNOSIS — E538 Deficiency of other specified B group vitamins: Secondary | ICD-10-CM | POA: Diagnosis not present

## 2021-07-11 MED ORDER — CYANOCOBALAMIN 1000 MCG/ML IJ SOLN
1000.0000 ug | Freq: Once | INTRAMUSCULAR | Status: AC
  Administered 2021-07-11: 1000 ug via INTRAMUSCULAR

## 2021-07-11 NOTE — Progress Notes (Signed)
Patient ID: Casey Brennan, male   DOB: Feb 02, 1961, 61 y.o.   MRN: [id number]         Chief Complaint:: wellness exam and Hospitalization Follow-up  With viral meningitis dec 22-27       HPI:  Casey Brennan is a 61 y.o. male here for wellness exam; pt declines shignrix, tdap, o/w up to date                        Also still with fatigue, low persistent stamina but HA now mild constant at the crown only, overall improved, just mostly annoying.  BP has been low normal despite reduced metoprolol.  Not yet started crestor.  Pt denies chest pain, increased sob or doe, wheezing, orthopnea, PND, increased LE swelling, palpitations, dizziness or syncope.   Pt denies polydipsia, polyuria, or new focal neuro s/s.   Pt denies fever, wt loss, night sweats, loss of appetite, or other constitutional symptoms  Has been seen per ID.      Transitional Care Management elements noted today: 1)  Date of D/C: as above 2)  Medication reconciliation:  done today at end visit 3)  Review of D/C summary or other information:  done today 4)  Review of need for f/u on pending diagnostic tests and treatments:  done today - renal u/s and CT abd/pelvis 5)  Review of need for Interaction with other providers who will assume or resume care of pt specific problems: done today 6)  Education of patient/Casey/guardian or caregiver: done today - Casey Brennan  Wt Readings from Last 3 Encounters:  07/11/21 206 lb (93.4 kg)  06/29/21 205 lb (93 kg)  06/23/21 209 lb (94.8 kg)   BP Readings from Last 3 Encounters:  07/11/21 100/60  06/29/21 130/85  06/29/21 119/89   Immunization History  Administered Date(s) Administered   Hepatitis A 06/12/2011   Influenza Split 06/12/2012   Influenza Whole 04/19/2009, 07/25/2010, 02/22/2011   Influenza,inj,Quad PF,6+ Mos 06/04/2013, 04/27/2014, 07/21/2015, 05/22/2016, 04/27/2017, 05/20/2020   Influenza-Unspecified 01/30/2015   Meningococcal Mcv4o 05/22/2016   PFIZER  Comirnaty(Gray Top)Covid-19 Tri-Sucrose Vaccine 05/25/2020   PFIZER(Purple Top)SARS-COV-2 Vaccination 10/23/2019, 11/13/2019, 05/25/2020   PPD Test 05/05/2009   Pfizer Covid-19 Vaccine Bivalent Booster 45yrs & up 04/08/2021   Pneumococcal Conjugate-13 11/11/2020   Pneumococcal Polysaccharide-23 03/22/2009, 08/19/2013, 11/15/2020   Tdap 06/12/2011   Zoster Recombinat (Shingrix) 12/27/2020   There are no preventive care reminders to display for this patient.     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   Pneumonia    hx of   Post concussion syndrome    resolved   Prostate cancer ([place]) 04/25/2013   gleason 3+3=6, volume 30 gm   Ulcer    hx of gastric   Past  Surgical History:  Procedure Laterality Date   ACDCF  09/29/2020   ANTERIOR CERVICAL DECOMPRESSION/DISCECTOMY FUSION 4 LEVELS N/A 09/29/2020   Procedure: ANTERIOR CERVICAL DECOMPRESSION FUSION CERVICAL 3-CERVICAL 4 , CERVICAL 4 - CERVICAL 5, CERVICAL 5 - CERVICAL 6 WITH INSTRUMENTATION AND ALLOGRAFT;  Surgeon: Phylliss Bob, MD;  Location: [facility];  Service: Orthopedics;  Laterality: N/A;   CARDIAC CATHETERIZATION  03-23-2009  DR COOPER   NORMAL CORONARY ARTERIES   CHOLECYSTECTOMY N/A 11/06/2014   Procedure: LAPAROSCOPIC CHOLECYSTECTOMY WITH INTRAOPERATIVE CHOLANGIOGRAM;  Surgeon: Judeth Horn, MD;  Location: [facility];  Service: General;  Laterality: N/A;   COLONOSCOPY  12/26/2011   Procedure:  COLONOSCOPY;  Surgeon: Lear Ng, MD;  Location: [facility];  Service: Endoscopy;  Laterality: N/A;   COLONOSCOPY     COLONOSCOPY WITH PROPOFOL N/A 07/29/2014   Procedure: COLONOSCOPY WITH PROPOFOL;  Surgeon: Lear Ng, MD;  Location: [facility];  Service: Endoscopy;  Laterality: N/A;   CYSTOSCOPY N/A 05/18/2018   Procedure: CYSTOSCOPY REMOVAL FOREIGN BODY;  Surgeon: Kathie Rhodes, MD;  Location: [facility];  Service: Urology;  Laterality: N/A;   CYSTOSCOPY/RETROGRADE/URETEROSCOPY Bilateral 04/25/2013   Procedure: CYSTOSCOPY/ BILATERAL RETROGRADES; BLADDER BIOPSIES;  Surgeon: Alexis Frock, MD;  Location: [facility];  Service: Urology;  Laterality: Bilateral;   DENTAL EXAMINATION UNDER ANESTHESIA     ESOPHAGOGASTRODUODENOSCOPY  12/26/2011   Procedure: ESOPHAGOGASTRODUODENOSCOPY (EGD);  Surgeon: Lear Ng, MD;  Location: [facility];  Service: Endoscopy;  Laterality: N/A;   ESOPHAGOGASTRODUODENOSCOPY (EGD) WITH PROPOFOL N/A 07/29/2014   Procedure: ESOPHAGOGASTRODUODENOSCOPY (EGD) WITH PROPOFOL;  Surgeon: Lear Ng, MD;  Location: [facility];  Service: Endoscopy;  Laterality: N/A;   EXCISION CHRONIC LEFT BREAST ABSCESS  09-21-2010   EXCISIONAL BX LEFT BREAST MASS/  I  &  D LEFT BREAST ABSCESS  03-24-2009   KNEE ARTHROSCOPY Right 1985   left axilla biopsy  04/2013   left hip biopsy  10/2012   LYMPHADENECTOMY Bilateral 08/18/2013   Procedure: LYMPHADENECTOMY "PELVIC LYMPH NODE DISSECTION";  Surgeon: Alexis Frock, MD;  Location: [facility];  Service: Urology;  Laterality: Bilateral;   PROSTATE BIOPSY N/A 04/25/2013   Procedure: BIOPSY TRANSRECTAL ULTRASONIC PROSTATE (TUBP);  Surgeon: Alexis Frock, MD;  Location: [facility];  Service: Urology;  Laterality: N/A;   ROBOT ASSISTED LAPAROSCOPIC RADICAL PROSTATECTOMY N/A 08/18/2013   Procedure: ROBOTIC ASSISTED LAPAROSCOPIC RADICAL PROSTATECTOMY;  Surgeon: Alexis Frock, MD;   Location: [facility];  Service: Urology;  Laterality: N/A;   TOTAL KNEE ARTHROPLASTY Right 08/01/2016   08/17/16   TOTAL KNEE ARTHROPLASTY Right 08/01/2016   Procedure: TOTAL KNEE ARTHROPLASTY;  Surgeon: Renette Butters, MD;  Location: [facility];  Service: Orthopedics;  Laterality: Right;   UPPER GASTROINTESTINAL ENDOSCOPY     UPPER LEG SOFT TISSUE BIOPSY Left 2012   lthigh    reports that he quit smoking about 11 years ago. His smoking use included cigars and cigarettes. He has a 34.50 pack-year smoking history. He has never used smokeless tobacco. He reports that he does not currently use alcohol. He reports that he does not use drugs. Casey history includes Asthma in his father and sister; Cancer in his father and maternal uncle; Diabetes in his father; Hypertension in his sister; Stroke in his father. Allergies  Allergen Reactions   Amoxapine Shortness Of Breath   Chocolate Shortness Of Breath and Other (See Comments)    Asthma attack, welts   Descovy [Emtricitabine-Tenofovir Af] Shortness Of Breath    Nasuea, vomiiting, diarrhea, sob,  whelps, throat closes   Genvoya [Elviteg-Cobic-Emtricit-Tenofaf] Shortness Of Breath    Nausea, vomitting, diarreha, sob, rash, throat closing   Morphine And Related Shortness Of Breath, Itching and Other (See Comments)     chest pain   Oxycodone-Acetaminophen Shortness Of Breath   Penicillins Shortness Of Breath and Rash    Has patient had a PCN reaction causing immediate rash, facial/tongue/throat swelling, SOB or lightheadedness with hypotension: Yes Has patient had a PCN reaction causing severe rash involving mucus membranes or skin necrosis: No Has patient had a PCN reaction that required hospitalization No Has patient had a PCN reaction occurring within the last 10 years: Yes If all of the above answers are "NO", then may proceed with Cephalosporin use.   Sulfa Antibiotics Anaphylaxis and Other (See Comments)   Tomato Shortness Of Breath   Vancomycin  Shortness Of Breath and Other (See Comments)   Darunavir Itching   Carvedilol     LEG SWELLING, DIZZNESS   Flagyl [Metronidazole] Itching   Milk-Related Compounds    Morphine Other (See Comments)   Penicillin G Other (See Comments)   Toradol [Ketorolac Tromethamine] Nausea And Vomiting    itching   Acetaminophen Rash and Other (See Comments)    Pt tolerates percocet and also plain APAP without rash   Tramadol Itching, Nausea And Vomiting and Other (See Comments)   Current Outpatient Medications on File Prior to Visit  Medication Sig Dispense Refill   albuterol (VENTOLIN HFA) 108 (90 Base) MCG/ACT inhaler Inhale 2 puffs into the lungs every 6 (six) hours as needed for wheezing or shortness of breath. 36 g 3   ALPRAZolam (XANAX) 0.5 MG tablet Take 1 tablet (0.5 mg total) by mouth 2 (two) times daily as needed for sleep. 60 tablet 1   Beclomethasone Dipropionate (QNASL) 80 MCG/ACT AERS Place 2 sprays into the nose daily. 10.6 g 2   bictegravir-emtricitabine-tenofovir AF (BIKTARVY) 50-200-25 MG TABS tablet Take 1 tablet by mouth daily. 30 tablet 11   bisacodyl (DULCOLAX) 5 MG EC tablet Take 2 tablets (10 mg total) by mouth daily as needed for moderate constipation. 10 tablet 0   Budeson-Glycopyrrol-Formoterol (BREZTRI AEROSPHERE) 160-9-4.8 MCG/ACT AERO Inhale 2 puffs into the lungs in the morning and at bedtime. 10.7 g 5   cetirizine (ZYRTEC) 10 MG tablet Take 1 tablet (10 mg total) by mouth daily. 90 tablet 3   Cholecalciferol (VITAMIN D3) 250 MCG (10000 UT) capsule Take 1 tablet by mouth daily. [employer]     cyanocobalamin (,VITAMIN B-12,) 1000 MCG/ML injection Inject 1 mL (1,000 mcg total) into the muscle every 30 (thirty) days. 3 mL 3   diphenhydrAMINE (BENADRYL) 25 MG tablet Take 1 tablet (25 mg total) by mouth every 6 (six) hours as needed. (Patient taking differently: Take 25 mg by mouth every 6 (six) hours as needed for sleep, itching or allergies.) 30 tablet 0   docusate sodium  (COLACE) 100 MG capsule Take 2 capsules (200 mg total) by mouth 2 (two) times daily. 14 capsule 0   EPINEPHrine 0.3 mg/0.3 mL IJ SOAJ injection Inject 0.3 mg into the muscle as needed for anaphylaxis. 1 each 5   Glucos-Chond-Hyal Ac-Ca Fructo (MOVE FREE JOINT HEALTH ADVANCE PO) Take 1 tablet by mouth daily.     HYDROcodone-acetaminophen (NORCO/VICODIN) 5-325 MG tablet Take 1 tablet by mouth every 8 (eight) hours as needed for moderate pain or severe pain. 30 tablet 0   hydrOXYzine (ATARAX) 25 MG tablet TAKE 1 TABLET(25 MG) BY MOUTH AT  BEDTIME 90 tablet 3   meclizine (ANTIVERT) 25 MG tablet Take 25 mg by mouth daily as needed.     methocarbamol (ROBAXIN) 500 MG tablet Take 500 mg by mouth 4 (four) times daily.     metoprolol tartrate (LOPRESSOR) 25 MG tablet Take 0.5 tablets (12.5 mg total) by mouth 2 (two) times daily. 60 tablet 0   montelukast (SINGULAIR) 10 MG tablet Take 10 mg by mouth daily.     Multiple Vitamin (MULTIVITAMIN WITH MINERALS) TABS tablet Take 1 tablet by mouth daily after breakfast.      mupirocin ointment (BACTROBAN) 2 % APPLY EXTERNALLY TO THE AFFECTED AREA TWICE DAILY FOR 3 WEEKS, PLACE COTTON BETWEEN TOES TO ALLOW BETTER AERATION AS NEEDED (Patient taking differently: Apply 1 application topically See admin instructions. APPLY EXTERNALLY TO THE AFFECTED AREA TWICE DAILY FOR 3 WEEKS, PLACE COTTON BETWEEN TOES TO ALLOW BETTER AERATION AS NEEDED) 66 g 1   omeprazole (PRILOSEC) 40 MG capsule Take 1 capsule (40 mg total) by mouth 2 (two) times daily. 180 capsule 2   ondansetron (ZOFRAN-ODT) 4 MG disintegrating tablet Take 1 tablet (4 mg total) by mouth every 8 (eight) hours as needed for nausea or vomiting. 30 tablet 2   OVER THE COUNTER MEDICATION Take 2 capsules by mouth daily. Zhou: thyroid supplement     tizanidine (ZANAFLEX) 6 MG capsule TAKE 1 CAPSULE(6 MG) BY MOUTH THREE TIMES DAILY 90 capsule 11   torsemide (DEMADEX) 20 MG tablet Take 10 mg by mouth See admin instructions.  Tuesday Thursday Saturday     Ubrogepant (UBRELVY) 50 MG TABS Take 1 tablet at the onset of migraine can repeat in 2 hours if needed. 15 tablet 3   albuterol (PROVENTIL) (2.5 MG/3ML) 0.083% nebulizer solution Take 3 mLs (2.5 mg total) by nebulization every 6 (six) hours as needed for wheezing or shortness of breath. (Patient not taking: Reported on 06/30/2021) 75 mL 3   polyethylene glycol (MIRALAX / GLYCOLAX) 17 g packet Take 17 g by mouth daily. (Patient not taking: Reported on 06/30/2021) 7 each 0   [DISCONTINUED] zolpidem (AMBIEN) 5 MG tablet Take 1-2 tablets (5-10 mg total) by mouth at bedtime as needed for sleep. (Patient not taking: Reported on 04/23/2019) 30 tablet 2   No current facility-administered medications on file prior to visit.        ROS:  All others reviewed and negative.  Objective        PE:  BP 100/60 (BP Location: Left Arm, Patient Position: Sitting, Cuff Size: Normal)    Pulse 91    Temp 98.5 F (36.9 C) (Oral)    Ht 5\' 9"  (1.753 m)    Wt 206 lb (93.4 kg)    SpO2 95%    BMI 30.42 kg/m                 Constitutional: Pt appears in NAD               HENT: Head: NCAT.                Right Ear: External ear normal.                 Left Ear: External ear normal.                Eyes: . Pupils are equal, round, and reactive to light. Conjunctivae and EOM are normal               Nose: without  d/c or deformity               Neck: Neck supple. Gross normal ROM               Cardiovascular: Normal rate and regular rhythm.                 Pulmonary/Chest: Effort normal and breath sounds without rales or wheezing.                Abd:  Soft, NT, ND, + BS, no organomegaly               Neurological: Pt is alert. At baseline orientation, motor grossly intact               Skin: Skin is warm. No rashes, no other new lesions, LE edema - none               Psychiatric: Pt behavior is normal without agitation   Micro: none  Cardiac tracings I have personally interpreted today:   none  Pertinent Radiological findings (summarize): none   Lab Results  Component Value Date   WBC 5.8 06/28/2021   HGB 12.9 (L) 06/28/2021   HCT 38.0 (L) 06/28/2021   PLT 206 06/28/2021   GLUCOSE 95 06/28/2021   CHOL 183 12/27/2020   TRIG 133.0 12/27/2020   HDL 33.10 (L) 12/27/2020   LDLCALC 123 (H) 12/27/2020   ALT 16 06/28/2021   AST 16 06/28/2021   NA 135 06/28/2021   K 3.6 06/28/2021   CL 103 06/28/2021   CREATININE 1.31 (H) 06/28/2021   BUN 8 06/28/2021   CO2 25 06/28/2021   TSH 1.63 12/27/2020   PSA 0.00 Repeated and verified X2. (L) 12/27/2020   INR 1.0 06/23/2021   HGBA1C (L) 10/10/2010    4.3 (NOTE)                                                                       According to the ADA Clinical Practice Recommendations for 2011, when HbA1c is used as a screening test:   >=6.5%   Diagnostic of Diabetes Mellitus           (if abnormal result  is confirmed)  5.7-6.4%   Increased risk of developing Diabetes Mellitus  References:Diagnosis and Classification of Diabetes Mellitus,Diabetes MWNU,2725,36(UYQIH 1):S62-S69 and Standards of Medical Care in         Diabetes - 2011,Diabetes Care,2011,34  (Suppl 1):S11-S61.   MICROALBUR 16.88 (H) 10/05/2011   Assessment/Plan:  JAIRE PINKHAM is a 61 y.o. 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  Primary hypertension overcontrolled to hold metoprolol for, continue to monitor  Mixed  hyperlipidemia Ok to hold statin for now, and consider add next visit, continue lower chol diet  Chronic renal insufficiency,  stage 2 (mild) Lab Results  Component Value Date   CREATININE 1.31 (H) 06/28/2021   Stable overall, cont to avoid nephrotoxins   Vitamin D deficiency Last vitamin D Lab Results  Component Value Date   VD25OH 21.81 (L) 12/27/2020   Low, reminded to start oral replacement  Followup: Return in about 4 weeks (around 08/08/2021).  Cathlean Cower, MD 07/11/2021 8:56 PM [facility]

## 2021-07-11 NOTE — Assessment & Plan Note (Signed)
Lab Results  Component Value Date   CREATININE 1.31 (H) 06/28/2021   Stable overall, cont to avoid nephrotoxins

## 2021-07-11 NOTE — Assessment & Plan Note (Signed)
overcontrolled to hold metoprolol for, continue to monitor

## 2021-07-11 NOTE — Patient Instructions (Signed)
Ok to HOLD the metoprolol for now  [facility] to hold on starting the crestor as well  Please continue all other medications as before, and refills have been done if requested.  Please have the pharmacy call with any other refills you may need.  Please keep your appointments with your specialists as you may have planned - nephrology today  Please make an Appointment to return in 1 months, or sooner if needed

## 2021-07-11 NOTE — Assessment & Plan Note (Signed)
Ok to hold statin for now, and consider add next visit, continue lower chol diet

## 2021-07-11 NOTE — Progress Notes (Signed)
Patient in office for a hospital f/u and requested that his monthly B12 injection be administered in office with his prescription. Injection given in left deltoid and was tolerated well.

## 2021-07-11 NOTE — Assessment & Plan Note (Signed)
Last vitamin D Lab Results  Component Value Date   VD25OH 21.81 (L) 12/27/2020   Low, reminded to start oral replacement

## 2021-07-12 DIAGNOSIS — D631 Anemia in chronic kidney disease: Secondary | ICD-10-CM | POA: Diagnosis not present

## 2021-07-12 DIAGNOSIS — I129 Hypertensive chronic kidney disease with stage 1 through stage 4 chronic kidney disease, or unspecified chronic kidney disease: Secondary | ICD-10-CM | POA: Diagnosis not present

## 2021-07-12 DIAGNOSIS — N182 Chronic kidney disease, stage 2 (mild): Secondary | ICD-10-CM | POA: Diagnosis not present

## 2021-07-12 DIAGNOSIS — N2581 Secondary hyperparathyroidism of renal origin: Secondary | ICD-10-CM | POA: Diagnosis not present

## 2021-07-13 ENCOUNTER — Telehealth: Payer: Self-pay

## 2021-07-13 ENCOUNTER — Ambulatory Visit: Payer: Medicaid Other | Admitting: Student

## 2021-07-13 NOTE — Telephone Encounter (Signed)
Patient wife called and stated that after seeing PCP Dr. Cathlean Cower  at [facility], patient was advised to stop blood pressure medication because of his low BP readings.:  101/61 112/65 100/61  And he is concerned that the BP medication  may drop his BP even more. Patient wants to change appointment date/time to see you. Just FYI. Appointment is being changed.

## 2021-07-14 ENCOUNTER — Ambulatory Visit: Payer: Medicaid Other | Admitting: Cardiology

## 2021-07-14 ENCOUNTER — Encounter: Payer: Self-pay | Admitting: Cardiology

## 2021-07-14 ENCOUNTER — Other Ambulatory Visit: Payer: Self-pay

## 2021-07-14 VITALS — BP 128/84 | HR 90 | Temp 98.4°F | Resp 16 | Ht 69.0 in | Wt 211.0 lb

## 2021-07-14 DIAGNOSIS — I10 Essential (primary) hypertension: Secondary | ICD-10-CM

## 2021-07-14 NOTE — Progress Notes (Signed)
° ° °Patient referred by Casey Brennan, Casey W, MD for hypertension ° °Subjective:  ° °Casey Brennan, male    DOB: 07/03/1961, 61 y.o.   MRN: [id number] ° ° °Chief Complaint  °Patient presents with  ° Hypertension  ° Follow-up  °  2 week  ° ° °61 y.o. male with hypertension, hyperlipidemia, HIV. ° °Patient was recently hospitalized in 06/2021 with complaints of headache and were essentially treated as possible meningitis.  However, subsequent CSF and PCR were negative for viral meningitis.  Still pending as aseptic meningitis.  Since then, he has noticed decrease in his blood pressure numbers.  He is currently off any blood pressure medications.  Blood pressure is staying low normal.  He does not have any complaints at this time. ° °Initial consultation HPI 05/2020: °Patient was recently seen by [facility] Dr. [place] O'Neal.  Prior to that, he was also seen by Drs. Harwani and Kadakia.  His prior cardiac work-up showed coronary CTA with normal coronary arteries and calcium score 0.  He has not tolerated several antihypertensive medications in the past, including amlodipine, nifedipine, HCTZ, losartan, carvedilol, labetalol. He is tolerating metoprolol succinate. 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 He uses Aspirin 325 mg on M,Brennan,F during his trips.  ° ° °Current Outpatient Medications on File Prior to Visit  °Medication Sig Dispense Refill  ° albuterol (PROVENTIL) (2.5 MG/3ML) 0.083% nebulizer solution Take 3 mLs (2.5 mg total) by nebulization every 6 (six) hours as needed for wheezing or shortness of breath. 75 mL 3  ° albuterol (VENTOLIN HFA) 108 (90 Base) MCG/ACT inhaler Inhale 2 puffs into the lungs every 6 (six) hours as needed for wheezing or shortness of breath. 36 g 3  ° ALPRAZolam (XANAX) 0.5 MG tablet Take 1 tablet (0.5 mg total) by mouth 2 (two) times daily as needed for sleep. 60 tablet 1  ° Beclomethasone Dipropionate (QNASL) 80 MCG/ACT AERS Place 2 sprays into the  nose daily. 10.6 g 2  ° bictegravir-emtricitabine-tenofovir AF (BIKTARVY) 50-200-25 MG TABS tablet Take 1 tablet by mouth daily. 30 tablet 11  ° bisacodyl (DULCOLAX) 5 MG EC tablet Take 2 tablets (10 mg total) by mouth daily as needed for moderate constipation. 10 tablet 0  ° Budeson-Glycopyrrol-Formoterol (BREZTRI AEROSPHERE) 160-9-4.8 MCG/ACT AERO Inhale 2 puffs into the lungs in the morning and at bedtime. 10.7 g 5  ° cetirizine (ZYRTEC) 10 MG tablet Take 1 tablet (10 mg total) by mouth daily. 90 tablet 3  ° Cholecalciferol (VITAMIN D3) 250 MCG (10000 UT) capsule Take 1 tablet by mouth daily. [employer]'s Made    ° cyanocobalamin (,VITAMIN B-12,) 1000 MCG/ML injection Inject 1 mL (1,000 mcg total) into the muscle every 30 (thirty) days. 3 mL 3  ° diphenhydrAMINE (BENADRYL) 25 MG tablet Take 1 tablet (25 mg total) by mouth every 6 (six) hours as needed. (Patient taking differently: Take 25 mg by mouth every 6 (six) hours as needed for sleep, itching or allergies.) 30 tablet 0  ° docusate sodium (COLACE) 100 MG capsule Take 2 capsules (200 mg total) by mouth 2 (two) times daily. 14 capsule 0  ° EPINEPHrine 0.3 mg/0.3 mL IJ SOAJ injection Inject 0.3 mg into the muscle as needed for anaphylaxis. 1 each 5  ° Glucos-Chond-Hyal Ac-Ca Fructo (MOVE FREE JOINT HEALTH ADVANCE PO) Take 1 tablet by mouth daily.    ° HYDROcodone-acetaminophen (NORCO/VICODIN) 5-325 MG tablet Take 1 tablet by mouth every 8 (eight)   hours as needed for moderate pain or severe pain. 30 tablet 0  ° hydrOXYzine (ATARAX) 25 MG tablet TAKE 1 TABLET(25 MG) BY MOUTH AT BEDTIME 90 tablet 3  ° meclizine (ANTIVERT) 25 MG tablet Take 25 mg by mouth daily as needed.    ° methocarbamol (ROBAXIN) 500 MG tablet Take 500 mg by mouth 4 (four) times daily.    ° montelukast (SINGULAIR) 10 MG tablet Take 10 mg by mouth daily.    ° Multiple Vitamin (MULTIVITAMIN WITH MINERALS) TABS tablet Take 1 tablet by mouth daily after breakfast.     ° mupirocin ointment (BACTROBAN) 2  % APPLY EXTERNALLY TO THE AFFECTED AREA TWICE DAILY FOR 3 WEEKS, PLACE COTTON BETWEEN TOES TO ALLOW BETTER AERATION AS NEEDED (Patient taking differently: Apply 1 application topically See admin instructions. APPLY EXTERNALLY TO THE AFFECTED AREA TWICE DAILY FOR 3 WEEKS, PLACE COTTON BETWEEN TOES TO ALLOW BETTER AERATION AS NEEDED) 66 g 1  ° omeprazole (PRILOSEC) 40 MG capsule Take 1 capsule (40 mg total) by mouth 2 (two) times daily. 180 capsule 2  ° ondansetron (ZOFRAN-ODT) 4 MG disintegrating tablet Take 1 tablet (4 mg total) by mouth every 8 (eight) hours as needed for nausea or vomiting. 30 tablet 2  ° OVER THE COUNTER MEDICATION Take 2 capsules by mouth daily. Zhou: thyroid supplement    ° polyethylene glycol (MIRALAX / GLYCOLAX) 17 g packet Take 17 g by mouth daily. 7 each 0  ° tizanidine (ZANAFLEX) 6 MG capsule TAKE 1 CAPSULE(6 MG) BY MOUTH THREE TIMES DAILY 90 capsule 11  ° torsemide (DEMADEX) 20 MG tablet Take 10 mg by mouth See admin instructions. Tuesday Thursday Saturday    ° Ubrogepant (UBRELVY) 50 MG TABS Take 1 tablet at the onset of migraine can repeat in 2 hours if needed. 15 tablet 3  ° [DISCONTINUED] zolpidem (AMBIEN) 5 MG tablet Take 1-2 tablets (5-10 mg total) by mouth at bedtime as needed for sleep. (Patient not taking: Reported on 04/23/2019) 30 tablet 2  ° °No current facility-administered medications on file prior to visit.  ° ° °Cardiovascular and other pertinent studies: °EKG 06/29/2021:  °Sinus rhythm with single PVC at a rate of 107 bpm.  Normal axis.  Left atrial enlargement.  No evidence of ischemia or underlying injury pattern. ° °EKG 06/15/2021: °Sinus rhythm 82 bpm °Occasional PAC    °Left atrial enlargement  °Nonspecific T-abnormality ° °Echocardiogram 05/06/2020:  °Moderately depressed LV systolic function with EF 37%. Left ventricle  °cavity is normal in size. Moderate concentric hypertrophy of the left  °ventricle. Hypokinetic global wall motion. Doppler evidence of grade I   °(impaired) diastolic dysfunction, normal LAP. Calculated EF 37%.  °Structurally normal mitral valve.  Mild (Grade I) mitral regurgitation.  °Mild pulmonic regurgitation. ° ° °CCTA 04/07/2020: °1. Coronary calcium score of 0. °2. Normal coronary origin with right dominance. °3. Normal coronary arteries. °RECOMMENDATIONS: °1. No evidence of CAD (0%). Consider non-atherosclerotic causes of °chest pain. ° ° °Recent labs: °CMP Latest Ref Rng & Units 06/28/2021 06/27/2021 06/26/2021  °Glucose 70 - 99 mg/dL 95 88 103(H)  °BUN 6 - 20 mg/dL 8 6 5(L)  °Creatinine 0.61 - 1.24 mg/dL 1.31(H) 1.18 1.29(H)  °Sodium 135 - 145 mmol/L 135 137 136  °Potassium 3.5 - 5.1 mmol/L 3.6 3.9 3.4(L)  °Chloride 98 - 111 mmol/L 103 105 105  °CO2 22 - 32 mmol/L 25 22 23  °Calcium 8.9 - 10.3 mg/dL 8.9 8.8(L) 8.8(L)  °Total Protein 6.5 - 8.1 g/dL   g/dL 6.3(L) 5.7(L) 6.0(L)  Total Bilirubin 0.3 - 1.2 mg/dL 0.8 0.8 1.0  Alkaline Phos 38 - 126 U/L 79 66 79  AST 15 - 41 U/L _0 ALT 0 - 44 U/L _1 CBC Latest Ref Rng & Units 06/28/2021 06/27/2021 06/26/2021  WBC 4.0 - 10.5 K/uL 5.8 4.8 5.1  Hemoglobin 13.0 - 17.0 g/dL 12.9(L) 12.3(L) 13.2  Hematocrit 39.0 - 52.0 % 38.0(L) 35.5(L) 37.4(L)  Platelets 150 - 400 K/uL 206 147(L) 166   Lipid Panel     Component Value Date/Time   CHOL 183 12/27/2020 0938   CHOL 163 05/25/2020 1432   TRIG 133.0 12/27/2020 0938   HDL 33.10 (L) 12/27/2020 0938   HDL 45 05/25/2020 1432   CHOLHDL 6 12/27/2020 0938   VLDL 26.6 12/27/2020 0938   LDLCALC 123 (H) 12/27/2020 0938   LDLCALC 97 05/25/2020 1432   LDLCALC 82 03/11/2020 1540   HEMOGLOBIN A1C Lab Results  Component Value Date   HGBA1C (L) 10/10/2010    4.3 (NOTE)                                                                       According to the ADA Clinical Practice Recommendations for 2011, when HbA1c is used as a screening test:   >=6.5%   Diagnostic of Diabetes Mellitus           (if abnormal result  is confirmed)  5.7-6.4%    Increased risk of developing Diabetes Mellitus  References:Diagnosis and Classification of Diabetes Mellitus,Diabetes XTKW,4097,35(HGDJM 1):S62-S69 and Standards of Medical Care in         Diabetes - 2011,Diabetes Care,2011,34  (Suppl 1):S11-S61.   MPG 77 10/10/2010   TSH Recent Labs    12/27/20 0938  TSH 1.63    Other Labs: 09/27/2020: Glucose 95, BUN/Cr 13/1.47. EGFR 55. Na/K 139/3.9. Rest of the CMP normal H/H 13/41. MCV 95. Platelets 162 HbA1C N/A% Chol 163, TG 114, HDL 45, LDL 97 (05/2020) TSH 3.2 normal  03/11/2020: Glucose 89.  BUN/creatinine 10/1.19.  GFR N/A. Na/K 139/3.9.  Rest of the CMP normal. H/H 14/43. MCV 96.  Platelets 169. Cholesterol 153, triglycerides 92, HDL 52, LDL 82.    Review of Systems  Constitutional: Negative for malaise/fatigue.  Cardiovascular:  Negative for chest pain, dyspnea on exertion, leg swelling, palpitations and syncope.  Neurological:  Positive for dizziness (room spinning sensation).        Vitals:   07/14/21 1311  BP: 128/84  Pulse: 90  Resp: 16  Temp: 98.4 F (36.9 C)  SpO2: 93%    Body mass index is 31.16 kg/m. Filed Weights   07/14/21 1311  Weight: 211 lb (95.7 kg)     Objective:   Physical Exam Vitals reviewed.  Constitutional:      General: He is not in acute distress.    Comments: Appears older than stated age  HENT:     Head: Normocephalic and atraumatic.  Neck:     Vascular: No JVD.  Cardiovascular:     Rate and Rhythm: Normal rate and regular rhythm.     Pulses: Intact distal pulses.     Heart sounds: Normal heart sounds, S1 normal and S2 normal.  No murmur heard.   No gallop.  Pulmonary:     Effort: Pulmonary effort is normal. No respiratory distress.     Breath sounds: Normal breath sounds. No wheezing, rhonchi or rales.  Musculoskeletal:     Right lower leg: No edema.     Left lower leg: No edema.  Neurological:     Mental Status: He is alert.      Assessment & Recommendations:    61 y.o.  African-American male with hypertension, hyperlipidemia, COPD, HIV.  Hypertension: Currently, blood pressure is low normal without any antihypertensive therapy. Agree with discontinuation of all antihypertensive medications at this time. If needed, he can use hydralazine half tablet of 50 mg Q8 as needed, if SBP >140 mmHg.  Hypertensive cardiomyopathy: Echocardiogram in 05/2021 , EF around 45%. Normal coronaries (CTA 04/2020).   F/u in 6 months   [employer], PA-C 07/14/2021, 11:34 AM Office: [phone]

## 2021-07-15 DIAGNOSIS — B351 Tinea unguium: Secondary | ICD-10-CM | POA: Diagnosis not present

## 2021-07-15 DIAGNOSIS — L603 Nail dystrophy: Secondary | ICD-10-CM | POA: Diagnosis not present

## 2021-07-15 DIAGNOSIS — G63 Polyneuropathy in diseases classified elsewhere: Secondary | ICD-10-CM | POA: Diagnosis not present

## 2021-07-18 ENCOUNTER — Ambulatory Visit (INDEPENDENT_AMBULATORY_CARE_PROVIDER_SITE_OTHER): Payer: Medicare Other | Admitting: Neurology

## 2021-07-18 DIAGNOSIS — G4700 Insomnia, unspecified: Secondary | ICD-10-CM

## 2021-07-18 DIAGNOSIS — G4733 Obstructive sleep apnea (adult) (pediatric): Secondary | ICD-10-CM

## 2021-07-18 DIAGNOSIS — B20 Human immunodeficiency virus [HIV] disease: Secondary | ICD-10-CM

## 2021-07-18 DIAGNOSIS — G43009 Migraine without aura, not intractable, without status migrainosus: Secondary | ICD-10-CM

## 2021-07-18 DIAGNOSIS — R0681 Apnea, not elsewhere classified: Secondary | ICD-10-CM

## 2021-07-18 DIAGNOSIS — G4719 Other hypersomnia: Secondary | ICD-10-CM

## 2021-07-18 DIAGNOSIS — J441 Chronic obstructive pulmonary disease with (acute) exacerbation: Secondary | ICD-10-CM

## 2021-07-18 DIAGNOSIS — B9735 Human immunodeficiency virus, type 2 [HIV 2] as the cause of diseases classified elsewhere: Secondary | ICD-10-CM

## 2021-07-18 DIAGNOSIS — G63 Polyneuropathy in diseases classified elsewhere: Secondary | ICD-10-CM

## 2021-07-20 NOTE — Progress Notes (Signed)
[facility] TEST REPORT ( by Watch PAT)   STUDY DATE:  07-20-2021   ORDERING CLINICIAN: Larey Seat, MD  REFERRING CLINICIAN:    CLINICAL INFORMATION/HISTORY:06-22-2022 RV - HIV + patient , here with sister April. Here for worsening migraine. L side HA pn and neck pn. Was seen at the ED on 12-19/22. Had multiple imagining done. Pt continues to feel dizzy. ringing in L ear, last 4 days. HA are start at the front and radiates to the back, constantly achy and throbbing pn. Racing HR. Pt continues to vomit and feel weak. Pt has a respiratory virus infection and taking meds. 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 Has not had surgery at the moment.  MRI and CSF testing followed. Viral meningitis treatment was initiated,  Tony's sister has informed me of his snoring and daytime sleepiness, but this took a backseat after he presented with viral meningitis to our last outpatient visit. He scheduled this test 07-01-2021 by phone, having just started to feel better.   Epworth sleepiness score: 14/24.   BMI:30 kg/m   Neck Circumference: 16"   FINDINGS:   Sleep Summary:   Total Recording Time (hours, min): 10 h, 9 m.       Total Sleep Time (hours, min):     8 h , 25 m.             Percent REM (%):  27.6%                                       Respiratory Indices:   Calculated pAHI (per hour):   8/h                          REM pAHI: 10.4/h                                               NREM pAHI:  6.9/h                             Positional  AHI:      Supine sleep position was associated with an AHI of 13/h , while sleeping on the right side was only seen with an AHI of 3.2/h.  Snoring was present for 19.7% of total sleep time and was of average volume.                                            Oxygen Saturation Statistics:   O2 Saturation Range (%): Oxygen saturation varied between a nadir of 88% and a maximum of 98% with a mean oxygenation of 94%.                                       O2 Saturation (minutes) <89%:   0.1 minutes        Pulse Rate Statistics:       Pulse Range:   Heart rate varied between 50 and 102  bpm with a mean heart rate of 67 bpm.              IMPRESSION:  This HST confirms the presence of mild sleep apnea accentuated during REM sleep but not REM sleep dependent, not associated with hypoxia, but strongly associated with supine sleep position.   RECOMMENDATION: My first recommendation for Mr. Reeves is to avoid sleeping on his back. In nonsupine sleep positions his AHI is much lower.  And it is possibly enough to treat his apnea completely to avoid sleeping on his back.  Snoring also seems to be strongly positional dependent.  Since there is no hypoxia, I do not feel that he needs to use a CPAP.  But CPAP is an option for the treatment of mild apnea. Should the patient decide to want to try CPAP I will set him up with an auto titration device between 5 and 12 cm water pressure to centimeter EPR, heated humidification and a mask of his choice.  Please note that the patient has facial hair and may do better with a non-full face mask.    INTERPRETING PHYSICIAN:   Larey Seat, MD   Medical Director of [facility] at [facility].

## 2021-07-26 DIAGNOSIS — B20 Human immunodeficiency virus [HIV] disease: Secondary | ICD-10-CM | POA: Insufficient documentation

## 2021-07-26 DIAGNOSIS — G4719 Other hypersomnia: Secondary | ICD-10-CM | POA: Insufficient documentation

## 2021-07-26 NOTE — Progress Notes (Signed)
Calculated pAHI (per hour): 8/h  REM pAHI: 10.4/h NREM pAHI:6.9/h  Positional  AHI: Supine sleep position was associated with an AHI of 13/h , while sleeping on the right side was only seen with an AHI of 3.2/h. Snoring was present for 19.7% of total sleep time and was of average volume. IMPRESSION:  This HST confirms the presence of mild sleep apnea accentuated during REM sleep but not REM sleep dependent, not associated with hypoxia, but strongly associated with supine sleep position.  RECOMMENDATION: My first recommendation for Casey Brennan is to avoid sleeping on his back. In nonsupine sleep positions his AHI is much lower.  This alone can be enough to treat his apnea by avoid ing sleeping on his back.  Snoring also is strongly positional dependent.   Since there is no hypoxia, I do  feel that CPAP is optional, not necessary -but CPAP is an option for the treatment of mild apnea. Should the patient decide to want to try CPAP, I will set him up with an auto titration device between 5 and 12 cm water pressure and 2 centimeter EPR, heated humidification and a mask of his choice.   Please note that the patient has facial hair and may do better with a non-full face mask.

## 2021-07-26 NOTE — Procedures (Signed)
[facility] TEST REPORT ( by Watch PAT)   STUDY DATE:  07-20-2021   ORDERING CLINICIAN: Larey Seat, MD  REFERRING CLINICIAN:    CLINICAL INFORMATION/HISTORY:06-22-2022 RV - HIV + patient , here with sister Casey Brennan. Here for worsening migraine. L side HA pn and neck pn. Was seen at the ED on 12-19/22. Had multiple imagining done. Pt continues to feel dizzy. ringing in L ear, last 4 days. HA are start at the front and radiates to the back, constantly achy and throbbing pn. Racing HR. Pt continues to vomit and feel weak. Pt has a respiratory virus infection and taking meds. 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 Has not had surgery at the moment.  MRI and CSF testing followed. Viral meningitis treatment was initiated,  Casey Brennan sister has informed me of his snoring and daytime sleepiness, but this took a backseat after he presented with viral meningitis to our last outpatient visit. He scheduled this test 07-01-2021 by phone, having just started to feel better.   Epworth sleepiness score: 14/24.   BMI:30 kg/m   Neck Circumference: 16"   FINDINGS:   Sleep Summary:   Total Recording Time (hours, min): 10 h, 9 m.       Total Sleep Time (hours, min):     8 h , 25 m.             Percent REM (%):  27.6%                                       Respiratory Indices:   Calculated pAHI (per hour):   8/h                          REM pAHI: 10.4/h                                               NREM pAHI:  6.9/h                             Positional  AHI:      Supine sleep position was associated with an AHI of 13/h , while sleeping on the right side was only seen with an AHI of 3.2/h.  Snoring was present for 19.7% of total sleep time and was of average volume.                                            Oxygen Saturation Statistics:   O2 Saturation Range (%): Oxygen saturation varied between a nadir of 88% and a maximum of 98% with a mean oxygenation of 94%.                                       O2 Saturation (minutes) <89%:   0.1 minutes        Pulse Rate Statistics:       Pulse Range:   Heart rate varied between 50 and 102 bpm with  a mean heart rate of 67 bpm.              IMPRESSION:  This HST confirms the presence of mild sleep apnea accentuated during REM sleep but not REM sleep dependent, not associated with hypoxia, but strongly associated with supine sleep position.   RECOMMENDATION: My first recommendation for Casey Brennan is to avoid sleeping on his back. In nonsupine sleep positions his AHI is much lower.  This alone can be enough to treat his apnea by avoid ing sleeping on his back.  Snoring also is strongly positional dependent.   Since there is no hypoxia, I do  feel that CPAP is optional, not necessary -but CPAP is an option for the treatment of mild apnea. Should the patient decide to want to try CPAP, I will set him up with an auto titration device between 5 and 12 cm water pressure and 2 centimeter EPR, heated humidification and a mask of his choice.   Please note that the patient has facial hair and may do better with a non-full face mask.    INTERPRETING PHYSICIAN:   Larey Seat, MD   Medical Director of [facility] at [facility].

## 2021-07-27 ENCOUNTER — Encounter: Payer: Self-pay | Admitting: Neurology

## 2021-07-28 ENCOUNTER — Other Ambulatory Visit: Payer: Self-pay

## 2021-07-28 ENCOUNTER — Telehealth: Payer: Self-pay

## 2021-07-28 MED ORDER — METOPROLOL SUCCINATE ER 100 MG PO TB24: 100.0000 mg | ORAL_TABLET | Freq: Every day | ORAL | 3 refills | Status: DC

## 2021-07-28 NOTE — Telephone Encounter (Signed)
Recommend metoprolol succinate 100 mg aily (He previously tolerated this) Please send 30 pillsX2 refills Please keep [place] posted.  Thanks MJP

## 2021-07-28 NOTE — Telephone Encounter (Signed)
Called pt sister to inform her about the message above. Pt sister understood

## 2021-08-02 NOTE — Telephone Encounter (Signed)
Called the number on file, which is the patient's sister. Left a message advising the sleep study results were posted on mychart. Advised they can review the results there and respond with any questions they may have. They can also call with any questions.

## 2021-08-10 ENCOUNTER — Ambulatory Visit: Payer: PRIVATE HEALTH INSURANCE | Admitting: Internal Medicine

## 2021-08-11 ENCOUNTER — Ambulatory Visit: Payer: Commercial Managed Care - HMO | Admitting: Internal Medicine

## 2021-08-16 ENCOUNTER — Ambulatory Visit: Payer: Medicare Other | Admitting: Neurology

## 2021-08-21 ENCOUNTER — Other Ambulatory Visit: Payer: Self-pay | Admitting: Adult Health

## 2021-08-24 ENCOUNTER — Other Ambulatory Visit: Payer: Self-pay

## 2021-08-24 ENCOUNTER — Ambulatory Visit (INDEPENDENT_AMBULATORY_CARE_PROVIDER_SITE_OTHER): Payer: PRIVATE HEALTH INSURANCE | Admitting: Internal Medicine

## 2021-08-24 ENCOUNTER — Encounter: Payer: Self-pay | Admitting: Internal Medicine

## 2021-08-24 VITALS — BP 112/80 | HR 78 | Resp 18 | Ht 69.0 in | Wt 212.2 lb

## 2021-08-24 DIAGNOSIS — E538 Deficiency of other specified B group vitamins: Secondary | ICD-10-CM

## 2021-08-24 DIAGNOSIS — Z0001 Encounter for general adult medical examination with abnormal findings: Secondary | ICD-10-CM

## 2021-08-24 DIAGNOSIS — I10 Essential (primary) hypertension: Secondary | ICD-10-CM

## 2021-08-24 DIAGNOSIS — R739 Hyperglycemia, unspecified: Secondary | ICD-10-CM

## 2021-08-24 DIAGNOSIS — R8290 Unspecified abnormal findings in urine: Secondary | ICD-10-CM

## 2021-08-24 DIAGNOSIS — E782 Mixed hyperlipidemia: Secondary | ICD-10-CM

## 2021-08-24 DIAGNOSIS — E559 Vitamin D deficiency, unspecified: Secondary | ICD-10-CM | POA: Diagnosis not present

## 2021-08-24 LAB — LIPID PANEL
Cholesterol: 155 mg/dL (ref 0–200)
HDL: 46.4 mg/dL (ref >39.00)
LDL Cholesterol: 89 mg/dL (ref 0–99)
NonHDL: 108.67
Total CHOL/HDL Ratio: 3
Triglycerides: 98 mg/dL (ref 0.0–149.0)
VLDL: 19.6 mg/dL (ref 0.0–40.0)

## 2021-08-24 LAB — CBC WITH DIFFERENTIAL/PLATELET
Basophils Absolute: 0.1 10*3/uL (ref 0.0–0.1)
Basophils Relative: 1.2 % (ref 0.0–3.0)
Eosinophils Absolute: 0.4 10*3/uL (ref 0.0–0.7)
Eosinophils Relative: 8.1 % — ABNORMAL HIGH (ref 0.0–5.0)
HCT: 38.6 % — ABNORMAL LOW (ref 39.0–52.0)
Hemoglobin: 12.1 g/dL — ABNORMAL LOW (ref 13.0–17.0)
Lymphocytes Relative: 41.9 % (ref 12.0–46.0)
Lymphs Abs: 2 10*3/uL (ref 0.7–4.0)
MCHC: 31.4 g/dL (ref 30.0–36.0)
MCV: 96.2 fl (ref 78.0–100.0)
Monocytes Absolute: 0.4 10*3/uL (ref 0.1–1.0)
Monocytes Relative: 9 % (ref 3.0–12.0)
Neutro Abs: 1.9 10*3/uL (ref 1.4–7.7)
Neutrophils Relative %: 39.8 % — ABNORMAL LOW (ref 43.0–77.0)
Platelets: 182 10*3/uL (ref 150.0–400.0)
RBC: 4.01 Mil/uL — ABNORMAL LOW (ref 4.22–5.81)
RDW: 17.8 % — ABNORMAL HIGH (ref 11.5–15.5)
WBC: 4.7 10*3/uL (ref 4.0–10.5)

## 2021-08-24 LAB — BASIC METABOLIC PANEL
BUN: 11 mg/dL (ref 6–23)
CO2: 31 mEq/L (ref 19–32)
Calcium: 9.1 mg/dL (ref 8.4–10.5)
Chloride: 103 mEq/L (ref 96–112)
Creatinine, Ser: 1.12 mg/dL (ref 0.40–1.50)
GFR: 72.55 mL/min (ref >60.00)
Glucose, Bld: 99 mg/dL (ref 70–99)
Potassium: 3.7 mEq/L (ref 3.5–5.1)
Sodium: 135 mEq/L (ref 135–145)

## 2021-08-24 LAB — HEPATIC FUNCTION PANEL
ALT: 13 U/L (ref 0–53)
AST: 16 U/L (ref 0–37)
Albumin: 4.3 g/dL (ref 3.5–5.2)
Alkaline Phosphatase: 110 U/L (ref 39–117)
Bilirubin, Direct: 0.1 mg/dL (ref 0.0–0.3)
Total Bilirubin: 0.5 mg/dL (ref 0.2–1.2)
Total Protein: 6.8 g/dL (ref 6.0–8.3)

## 2021-08-24 LAB — VITAMIN B12: Vitamin B-12: 816 pg/mL (ref 211–911)

## 2021-08-24 LAB — TSH: TSH: 1.48 u[IU]/mL (ref 0.35–5.50)

## 2021-08-24 MED ORDER — ROSUVASTATIN CALCIUM 20 MG PO TABS: 20.0000 mg | ORAL_TABLET | Freq: Every day | ORAL | 3 refills | Status: DC

## 2021-08-24 NOTE — Assessment & Plan Note (Signed)
Age and sex appropriate education and counseling updated with regular exercise and diet Referrals for preventative services - none needed Immunizations addressed - declines flu shot, tdap, shingrix Smoking counseling  - none needed Evidence for depression or other mood disorder - none significant Most recent labs reviewed. I have personally reviewed and have noted: 1) the patient's medical and social history 2) The patient's current medications and supplements 3) The patient's height, weight, and BMI have been recorded in the chart

## 2021-08-24 NOTE — Assessment & Plan Note (Signed)
Last vitamin D Lab Results  Component Value Date   VD25OH 21.81 (L) 12/27/2020   Low, to start oral replacement

## 2021-08-24 NOTE — Assessment & Plan Note (Addendum)
BP remains low normal post hospn after recent 20 lb wt loss; gained 4 lbs back, tryiing not to regain further wt; ok to continue to monitor BP

## 2021-08-24 NOTE — Assessment & Plan Note (Signed)
Uncontrolled, ok to restart crestor 20, cont low chol diet

## 2021-08-24 NOTE — Progress Notes (Signed)
Patient ID: Casey Brennan, male   DOB: 1960/08/15, 61 y.o.   MRN: [id number]         Chief Complaint:: wellness exam and 1 month f/u (He mentioned that for the last month his urine has a odor to it. He has tried to see his kidney doctor. )  , hld, htn       HPI:  Casey Brennan is a 61 y.o. male here for wellness exam; declines flu shot, shingrix, tdap o/w up to date                        Also BP has remained low normal post hospn and conts to hold on BP meds, Pt denies chest pain, increased sob or doe, wheezing, orthopnea, PND, increased LE swelling, palpitations, dizziness or syncope.  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  Ready to start statin  Denies urinary symptoms such as dysuria, frequency, urgency, flank pain, hematuria or n/v, fever, chills, but does have bad odor at times off and on in the past month.   Pt denies polydipsia, polyuria, or new focal neuro s/s.   Pt denies fever, wt loss, night sweats, loss of appetite, or other constitutional symptoms  Has seen cardiology and BB formally d/c as well.    Wt Readings from Last 3 Encounters:  08/24/21 212 lb 3.2 oz (96.3 kg)  07/14/21 211 lb (95.7 kg)  07/11/21 206 lb (93.4 kg)   BP Readings from Last 3 Encounters:  08/24/21 112/80  07/14/21 128/84  07/11/21 100/60   Immunization History  Administered Date(s) Administered   Hepatitis A 06/12/2011   Influenza Split 06/12/2012   Influenza Whole 04/19/2009, 07/25/2010, 02/22/2011   Influenza,inj,Quad PF,6+ Mos 06/04/2013, 04/27/2014, 07/21/2015, 05/22/2016, 04/27/2017, 05/20/2020   Influenza-Unspecified 01/30/2015   Meningococcal Mcv4o 05/22/2016   PFIZER Comirnaty(Gray Top)Covid-19 Tri-Sucrose Vaccine 05/25/2020   PFIZER(Purple Top)SARS-COV-2 Vaccination 10/23/2019, 11/13/2019, 05/25/2020   PPD Test 05/05/2009   Pfizer Covid-19 Vaccine Bivalent Booster 2yrs & up 04/08/2021   Pneumococcal Conjugate-13  11/11/2020   Pneumococcal Polysaccharide-23 03/22/2009, 08/19/2013, 11/15/2020   Tdap 06/12/2011   Zoster Recombinat (Shingrix) 12/27/2020   There are no preventive care reminders to display for this patient.     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   Pneumonia    hx of   Post concussion syndrome    resolved   Prostate cancer ([place]) 04/25/2013   gleason 3+3=6, volume 30 gm   Ulcer    hx of gastric   Past Surgical History:  Procedure Laterality Date   ACDCF  09/29/2020   ANTERIOR CERVICAL DECOMPRESSION/DISCECTOMY FUSION 4 LEVELS N/A 09/29/2020   Procedure: ANTERIOR CERVICAL DECOMPRESSION FUSION CERVICAL 3-CERVICAL 4 , CERVICAL  4 - CERVICAL 5, CERVICAL 5 - CERVICAL 6 WITH INSTRUMENTATION AND ALLOGRAFT;  Surgeon: Phylliss Bob, MD;  Location: [place];  Service: Orthopedics;  Laterality: N/A;   CARDIAC CATHETERIZATION  03-23-2009  DR COOPER   NORMAL CORONARY ARTERIES   CHOLECYSTECTOMY N/A 11/06/2014   Procedure: LAPAROSCOPIC CHOLECYSTECTOMY WITH INTRAOPERATIVE CHOLANGIOGRAM;  Surgeon: Judeth Horn, MD;  Location: [place];  Service: General;  Laterality: N/A;   COLONOSCOPY  12/26/2011   Procedure: COLONOSCOPY;  Surgeon: Lear Ng, MD;  Location: [facility];  Service: Endoscopy;  Laterality: N/A;   COLONOSCOPY     COLONOSCOPY WITH PROPOFOL N/A 07/29/2014   Procedure: COLONOSCOPY WITH PROPOFOL;  Surgeon: Lear Ng, MD;  Location: [place];  Service: Endoscopy;  Laterality: N/A;   CYSTOSCOPY N/A 05/18/2018   Procedure: CYSTOSCOPY REMOVAL FOREIGN BODY;  Surgeon: Kathie Rhodes, MD;  Location: [facility];  Service: Urology;  Laterality: N/A;   CYSTOSCOPY/RETROGRADE/URETEROSCOPY Bilateral 04/25/2013   Procedure: CYSTOSCOPY/ BILATERAL RETROGRADES; BLADDER BIOPSIES;  Surgeon: Alexis Frock, MD;  [facility]: [facility];  Service: Urology;  Laterality: Bilateral;   DENTAL EXAMINATION UNDER ANESTHESIA     ESOPHAGOGASTRODUODENOSCOPY  12/26/2011   Procedure: ESOPHAGOGASTRODUODENOSCOPY (EGD);  Surgeon: Lear Ng, MD;  Location: [facility];  Service: Endoscopy;  Laterality: N/A;   ESOPHAGOGASTRODUODENOSCOPY (EGD) WITH PROPOFOL N/A 07/29/2014   Procedure: ESOPHAGOGASTRODUODENOSCOPY (EGD) WITH PROPOFOL;  Surgeon: Lear Ng, MD;  Location: [place];  Service: Endoscopy;  Laterality: N/A;   EXCISION CHRONIC LEFT BREAST ABSCESS  09-21-2010   EXCISIONAL BX LEFT BREAST MASS/  I  &  D LEFT BREAST ABSCESS  03-24-2009   KNEE ARTHROSCOPY Right 1985   left axilla biopsy  04/2013   left hip biopsy  10/2012   LYMPHADENECTOMY Bilateral 08/18/2013   Procedure: LYMPHADENECTOMY "PELVIC LYMPH NODE DISSECTION";  Surgeon: Alexis Frock, MD;  Location: [facility];  Service: Urology;  Laterality: Bilateral;   PROSTATE BIOPSY N/A 04/25/2013   Procedure: BIOPSY TRANSRECTAL ULTRASONIC PROSTATE (TUBP);  Surgeon: Alexis Frock, MD;  Location[facility];  Service: Urology;  Laterality: N/A;   ROBOT ASSISTED LAPAROSCOPIC RADICAL PROSTATECTOMY N/A 08/18/2013   Procedure: ROBOTIC ASSISTED LAPAROSCOPIC RADICAL PROSTATECTOMY;  Surgeon: Alexis Frock, MD;  Location: [facility];  Service: Urology;  Laterality: N/A;   TOTAL KNEE ARTHROPLASTY Right 08/01/2016   08/17/16   TOTAL KNEE ARTHROPLASTY Right 08/01/2016   Procedure: TOTAL KNEE ARTHROPLASTY;  Surgeon: Renette Butters, MD;  Location: [place];  Service: Orthopedics;   Laterality: Right;   UPPER GASTROINTESTINAL ENDOSCOPY     UPPER LEG SOFT TISSUE BIOPSY Left 2012   lthigh    reports that he quit smoking about 11 years ago. His smoking use included cigars and cigarettes. He has a 34.50 pack-year smoking history. He has never used smokeless tobacco. He reports that he does not currently use alcohol. He reports that he does not use drugs. family history includes Asthma in his father and sister; Cancer in his father and maternal uncle; Diabetes in his father; Hypertension in his sister; Stroke in his father. Allergies  Allergen Reactions   Amoxapine Shortness Of Breath   Chocolate Shortness Of Breath and Other (See Comments)    Asthma attack, welts   Descovy [Emtricitabine-Tenofovir Af] Shortness Of Breath    Nasuea, vomiiting, diarrhea, sob, whelps, throat closes   Genvoya [Elviteg-Cobic-Emtricit-Tenofaf] Shortness Of Breath    Nausea, vomitting, diarreha, sob, rash, throat closing   Morphine And Related Shortness Of Breath, Itching and Other (See Comments)  chest pain   Oxycodone-Acetaminophen Shortness Of Breath   Penicillins Shortness Of Breath and Rash    Has patient had a PCN reaction causing immediate rash, facial/tongue/throat swelling, SOB or lightheadedness with hypotension: Yes Has patient had a PCN reaction causing severe rash involving mucus membranes or skin necrosis: No Has patient had a PCN reaction that required hospitalization No Has patient had a PCN reaction occurring within the last 10 years: Yes If all of the above answers are "NO", then may proceed with Cephalosporin use.   Sulfa Antibiotics Anaphylaxis and Other (See Comments)   Tomato Shortness Of Breath   Vancomycin Shortness Of Breath and Other (See Comments)   Darunavir Itching   Carvedilol     LEG SWELLING, DIZZNESS   Flagyl [Metronidazole] Itching   Milk-Related Compounds    Morphine Other (See Comments)   Penicillin G Other (See Comments)   Toradol [Ketorolac  Tromethamine] Nausea And Vomiting    itching   Acetaminophen Rash and Other (See Comments)    Pt tolerates percocet and also plain APAP without rash   Tramadol Itching, Nausea And Vomiting and Other (See Comments)   Current Outpatient Medications on File Prior to Visit  Medication Sig Dispense Refill   albuterol (PROVENTIL) (2.5 MG/3ML) 0.083% nebulizer solution Take 3 mLs (2.5 mg total) by nebulization every 6 (six) hours as needed for wheezing or shortness of breath. 75 mL 3   albuterol (VENTOLIN HFA) 108 (90 Base) MCG/ACT inhaler Inhale 2 puffs into the lungs every 6 (six) hours as needed for wheezing or shortness of breath. 36 g 3   ALPRAZolam (XANAX) 0.5 MG tablet Take 1 tablet (0.5 mg total) by mouth 2 (two) times daily as needed for sleep. 60 tablet 1   Beclomethasone Dipropionate (QNASL) 80 MCG/ACT AERS Place 2 sprays into the nose daily. 10.6 g 2   bictegravir-emtricitabine-tenofovir AF (BIKTARVY) 50-200-25 MG TABS tablet Take 1 tablet by mouth daily. 30 tablet 11   bisacodyl (DULCOLAX) 5 MG EC tablet Take 2 tablets (10 mg total) by mouth daily as needed for moderate constipation. 10 tablet 0   Budeson-Glycopyrrol-Formoterol (BREZTRI AEROSPHERE) 160-9-4.8 MCG/ACT AERO Inhale 2 puffs into the lungs in the morning and at bedtime. 10.7 g 5   cetirizine (ZYRTEC) 10 MG tablet Take 1 tablet (10 mg total) by mouth daily. 90 tablet 3   Cholecalciferol (VITAMIN D3) 250 MCG (10000 UT) capsule Take 1 tablet by mouth daily. [employer]'s [employer]     cyanocobalamin (,VITAMIN B-12,) 1000 MCG/ML injection Inject 1 mL (1,000 mcg total) into the muscle every 30 (thirty) days. 3 mL 3   diphenhydrAMINE (BENADRYL) 25 MG tablet Take 1 tablet (25 mg total) by mouth every 6 (six) hours as needed. (Patient taking differently: Take 25 mg by mouth every 6 (six) hours as needed for sleep, itching or allergies.) 30 tablet 0   EPINEPHrine 0.3 mg/0.3 mL IJ SOAJ injection Inject 0.3 mg into the muscle as needed for  anaphylaxis. 1 each 5   Glucos-Chond-Hyal Ac-Ca Fructo (MOVE FREE JOINT HEALTH ADVANCE PO) Take 1 tablet by mouth daily.     hydrOXYzine (ATARAX) 25 MG tablet TAKE 1 TABLET(25 MG) BY MOUTH AT BEDTIME 90 tablet 3   meclizine (ANTIVERT) 25 MG tablet Take 25 mg by mouth daily as needed.     methocarbamol (ROBAXIN) 500 MG tablet Take 500 mg by mouth 4 (four) times daily.     montelukast (SINGULAIR) 10 MG tablet Take 10 mg by mouth daily.  Multiple Vitamin (MULTIVITAMIN WITH MINERALS) TABS tablet Take 1 tablet by mouth daily after breakfast.      mupirocin ointment (BACTROBAN) 2 % APPLY EXTERNALLY TO THE AFFECTED AREA TWICE DAILY FOR 3 WEEKS, PLACE COTTON BETWEEN TOES TO ALLOW BETTER AERATION AS NEEDED (Patient taking differently: Apply 1 application topically See admin instructions. APPLY EXTERNALLY TO THE AFFECTED AREA TWICE DAILY FOR 3 WEEKS, PLACE COTTON BETWEEN TOES TO ALLOW BETTER AERATION AS NEEDED) 66 g 1   omeprazole (PRILOSEC) 40 MG capsule Take 1 capsule (40 mg total) by mouth 2 (two) times daily. 180 capsule 2   ondansetron (ZOFRAN-ODT) 4 MG disintegrating tablet Take 1 tablet (4 mg total) by mouth every 8 (eight) hours as needed for nausea or vomiting. 30 tablet 2   OVER THE COUNTER MEDICATION Take 2 capsules by mouth daily. Zhou: thyroid supplement     polyethylene glycol (MIRALAX / GLYCOLAX) 17 g packet Take 17 g by mouth daily. 7 each 0   tizanidine (ZANAFLEX) 6 MG capsule TAKE 1 CAPSULE(6 MG) BY MOUTH THREE TIMES DAILY 90 capsule 11   torsemide (DEMADEX) 20 MG tablet Take 10 mg by mouth See admin instructions. Tuesday Thursday Saturday     Ubrogepant (UBRELVY) 50 MG TABS TAKE 1 TABLET BY MOUTH AT ONSET OF MIGRAINE. MAY REPEAT IN 2 HOURS AS NEEDED 15 tablet 0   [DISCONTINUED] zolpidem (AMBIEN) 5 MG tablet Take 1-2 tablets (5-10 mg total) by mouth at bedtime as needed for sleep. (Patient not taking: Reported on 04/23/2019) 30 tablet 2   No current facility-administered medications on  file prior to visit.        ROS:  All others reviewed and negative.  Objective        PE:  BP 112/80    Pulse 78    Resp 18    Ht 5\' 9"  (1.753 m)    Wt 212 lb 3.2 oz (96.3 kg)    SpO2 97%    BMI 31.34 kg/m                 Constitutional: Pt appears in NAD               HENT: Head: NCAT.                Right Ear: External ear normal.                 Left Ear: External ear normal.                Eyes: . Pupils are equal, round, and reactive to light. Conjunctivae and EOM are normal               Nose: without d/c or deformity               Neck: Neck supple. Gross normal ROM               Cardiovascular: Normal rate and regular rhythm.                 Pulmonary/Chest: Effort normal and breath sounds without rales or wheezing.                Abd:  Soft, NT, ND, + BS, no organomegaly               Neurological: Pt is alert. At baseline orientation, motor grossly intact               Skin: Skin is  warm. No rashes, no other new lesions, LE edema - none               Psychiatric: Pt behavior is normal without agitation   Micro: none  Cardiac tracings I have personally interpreted today:  none  Pertinent Radiological findings (summarize): none   Lab Results  Component Value Date   WBC 5.8 06/28/2021   HGB 12.9 (L) 06/28/2021   HCT 38.0 (L) 06/28/2021   PLT 206 06/28/2021   GLUCOSE 95 06/28/2021   CHOL 183 12/27/2020   TRIG 133.0 12/27/2020   HDL 33.10 (L) 12/27/2020   LDLCALC 123 (H) 12/27/2020   ALT 16 06/28/2021   AST 16 06/28/2021   NA 135 06/28/2021   K 3.6 06/28/2021   CL 103 06/28/2021   CREATININE 1.31 (H) 06/28/2021   BUN 8 06/28/2021   CO2 25 06/28/2021   TSH 1.63 12/27/2020   PSA 0.00 Repeated and verified X2. (L) 12/27/2020   INR 1.0 06/23/2021   HGBA1C (L) 10/10/2010    4.3 (NOTE)                                                                       According to the ADA Clinical Practice Recommendations for 2011, when HbA1c is used as a screening test:    >=6.5%   Diagnostic of Diabetes Mellitus           (if abnormal result  is confirmed)  5.7-6.4%   Increased risk of developing Diabetes Mellitus  References:Diagnosis and Classification of Diabetes Mellitus,Diabetes HYWV,3710,62(IRSWN 1):S62-S69 and Standards of Medical Care in         Diabetes - 2011,Diabetes Care,2011,34  (Suppl 1):S11-S61.   MICROALBUR 16.88 (H) 10/05/2011   Assessment/Plan:  Casey Brennan is a 61 y.o. 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  Vitamin D deficiency Last vitamin D Lab Results  Component Value Date   VD25OH 21.81 (L) 12/27/2020   Low, to start oral replacement   Encounter for well adult exam with abnormal findings Age and sex appropriate education and counseling updated with regular exercise and diet Referrals for preventative services - none needed Immunizations addressed - declines flu shot, tdap, shingrix Smoking counseling  - none needed Evidence for depression or other mood disorder - none significant Most recent labs reviewed. I have personally reviewed and have noted: 1) the patient's medical and social history 2) The patient's current medications and supplements 3) The patient's height, weight, and BMI have been recorded in the chart   Essential hypertension BP remains low normal post hospn after recent 20 lb wt loss; gained 4 lbs back, tryiing not to regain further wt; ok to continue to monitor  BP  Mixed hyperlipidemia Uncontrolled, ok to restart crestor 20, cont low chol diet  Abnormal urine odor  Also for urine culture  Followup: Return in about 6 months (around 02/21/2022).  Cathlean Cower, MD 08/24/2021 8:21 PM [facility]

## 2021-08-24 NOTE — Patient Instructions (Signed)
Please take all new medication as prescribed  - the crestor for cholesterol  No need to restart the other medications at this time  Please continue all other medications as before, and refills have been done if requested.  Please have the pharmacy call with any other refills you may need.  Please continue your efforts at being more active, low cholesterol diet, and weight control.  You are otherwise up to date with prevention measures today.  Please keep your appointments with your specialists as you may have planned  Please go to the LAB at the blood drawing area for the tests to be done  You will be contacted by phone if any changes need to be made immediately.  Otherwise, you will receive a letter about your results with an explanation, but please check with MyChart first.  Please remember to sign up for MyChart if you have not done so, as this will be important to you in the future with finding out test results, communicating by private email, and scheduling acute appointments online when needed.  Please make an Appointment to return in 6 months, or sooner if needed

## 2021-08-24 NOTE — Assessment & Plan Note (Signed)
Also for urine culture

## 2021-08-25 ENCOUNTER — Telehealth: Payer: Self-pay | Admitting: Neurology

## 2021-08-25 ENCOUNTER — Other Ambulatory Visit: Payer: PRIVATE HEALTH INSURANCE

## 2021-08-25 DIAGNOSIS — R739 Hyperglycemia, unspecified: Secondary | ICD-10-CM

## 2021-08-25 LAB — URINALYSIS, ROUTINE W REFLEX MICROSCOPIC
Bilirubin Urine: NEGATIVE
Hgb urine dipstick: NEGATIVE
Ketones, ur: NEGATIVE
Leukocytes,Ua: NEGATIVE
Nitrite: NEGATIVE
RBC / HPF: NONE SEEN (ref 0–?)
Specific Gravity, Urine: 1.025 (ref 1.000–1.030)
Total Protein, Urine: 30 — AB
Urine Glucose: NEGATIVE
Urobilinogen, UA: 0.2 (ref 0.0–1.0)
pH: 5.5 (ref 5.0–8.0)

## 2021-08-25 LAB — PSA: PSA: 0 ng/mL — ABNORMAL LOW (ref 0.10–4.00)

## 2021-08-25 LAB — VITAMIN D 25 HYDROXY (VIT D DEFICIENCY, FRACTURES): VITD: 42.22 ng/mL (ref 30.00–100.00)

## 2021-08-25 NOTE — Telephone Encounter (Signed)
Received a PA request to be competed. Completed on CMM [id number]   "This medication or product was previously approved on [id number] from 2021-07-03 to 2022-07-02. **Please note: This request was submitted electronically."

## 2021-08-26 ENCOUNTER — Other Ambulatory Visit: Payer: Self-pay | Admitting: Internal Medicine

## 2021-08-26 LAB — URINE CULTURE

## 2021-08-26 LAB — HEMOGLOBIN A1C: Hgb A1c MFr Bld: <4 % of total Hgb (ref <5.7)

## 2021-08-26 MED ORDER — DOXYCYCLINE HYCLATE 100 MG PO TABS
100.0000 mg | ORAL_TABLET | Freq: Two times a day (BID) | ORAL | 0 refills | Status: DC
Start: 2021-08-26 — End: 2021-10-27

## 2021-09-13 ENCOUNTER — Other Ambulatory Visit: Payer: Self-pay | Admitting: Cardiology

## 2021-09-13 DIAGNOSIS — R310 Gross hematuria: Secondary | ICD-10-CM | POA: Diagnosis not present

## 2021-09-13 DIAGNOSIS — N281 Cyst of kidney, acquired: Secondary | ICD-10-CM | POA: Diagnosis not present

## 2021-09-13 DIAGNOSIS — I10 Essential (primary) hypertension: Secondary | ICD-10-CM

## 2021-09-16 DIAGNOSIS — G629 Polyneuropathy, unspecified: Secondary | ICD-10-CM | POA: Diagnosis not present

## 2021-09-16 DIAGNOSIS — B351 Tinea unguium: Secondary | ICD-10-CM | POA: Diagnosis not present

## 2021-10-03 ENCOUNTER — Ambulatory Visit: Payer: PRIVATE HEALTH INSURANCE | Admitting: Internal Medicine

## 2021-10-04 ENCOUNTER — Other Ambulatory Visit: Payer: Self-pay | Admitting: Neurology

## 2021-10-04 ENCOUNTER — Other Ambulatory Visit: Payer: Self-pay | Admitting: Pulmonary Disease

## 2021-10-04 DIAGNOSIS — G4700 Insomnia, unspecified: Secondary | ICD-10-CM

## 2021-10-04 NOTE — Telephone Encounter (Signed)
Last OV was on 06/22/21.  ?Next OV is scheduled for 10/25/21.  ?Last RX was written on 08/22/21 for 60 tabs.  ? ?[facility] has been reviewed.  ?

## 2021-10-10 ENCOUNTER — Ambulatory Visit: Payer: PRIVATE HEALTH INSURANCE | Admitting: Internal Medicine

## 2021-10-12 ENCOUNTER — Ambulatory Visit: Payer: PRIVATE HEALTH INSURANCE | Admitting: Internal Medicine

## 2021-10-13 DIAGNOSIS — H401114 Primary open-angle glaucoma, right eye, indeterminate stage: Secondary | ICD-10-CM | POA: Diagnosis not present

## 2021-10-13 DIAGNOSIS — H40022 Open angle with borderline findings, high risk, left eye: Secondary | ICD-10-CM | POA: Diagnosis not present

## 2021-10-25 ENCOUNTER — Ambulatory Visit: Payer: Medicare Other | Admitting: Neurology

## 2021-10-27 ENCOUNTER — Encounter: Payer: Self-pay | Admitting: Neurology

## 2021-10-27 ENCOUNTER — Ambulatory Visit (INDEPENDENT_AMBULATORY_CARE_PROVIDER_SITE_OTHER): Payer: Medicare Other | Admitting: Neurology

## 2021-10-27 VITALS — BP 131/84 | HR 76 | Ht 70.0 in | Wt 216.0 lb

## 2021-10-27 DIAGNOSIS — G43109 Migraine with aura, not intractable, without status migrainosus: Secondary | ICD-10-CM

## 2021-10-27 DIAGNOSIS — B20 Human immunodeficiency virus [HIV] disease: Secondary | ICD-10-CM | POA: Diagnosis not present

## 2021-10-27 DIAGNOSIS — G4700 Insomnia, unspecified: Secondary | ICD-10-CM

## 2021-10-27 DIAGNOSIS — A879 Viral meningitis, unspecified: Secondary | ICD-10-CM

## 2021-10-27 MED ORDER — ALPRAZOLAM 0.5 MG PO TABS
ORAL_TABLET | ORAL | 0 refills | Status: DC
Start: 2021-10-27 — End: 2021-12-27

## 2021-10-27 MED ORDER — UBRELVY 100 MG PO TABS
100.0000 mg | ORAL_TABLET | Freq: Two times a day (BID) | ORAL | 1 refills | Status: DC | PRN
Start: 2021-10-27 — End: 2021-10-31

## 2021-10-27 NOTE — Patient Instructions (Signed)
Viral Meningitis, Adult ?Viral meningitis is an infection of the tissues (meninges) that cover the brain and spinal cord, or an infection in the fluid that surrounds the brain and spinal cord (cerebrospinal fluid). Many common viruses can cause viral meningitis. ?What are the causes? ?Viral meningitis is caused by viruses, such as: ?Enteroviruses. These types of viruses are the most common cause of viral meningitis. ?Herpes. ?HIV (human immunodeficiency virus). ?Measles. ?Mumps. ?Chicken pox (varicella zoster). ?Flu (influenza) viruses. ?These viruses can be spread in different ways, such as through contact with: ?Stool (feces). You could get sick by touching something that has been contaminated with infected stool and then touching your eyes, nose, or mouth. ?Respiratory secretions. You could get sick from coughs or sneezes of an infected person, similar to the spreading of the common cold. ?Infected blood or infected bodily fluids. ?Rodents. This includes being bitten by rodents or coming in contact with their urine, droppings, saliva, or nesting areas. ?Insects. This includes mosquito bites or tick bites. ?When a virus enters your system, it can spread through the blood to reach the brain and spinal cord. ?What increases the risk? ?You may be at higher risk for meningitis if you have a weakened body disease-fighting system (immune system). ?What are the signs or symptoms? ?Symptoms of viral meningitis may be similar to symptoms of a cold or the flu. Signs and symptoms may include: ?Fever. ?Headache or stiff neck. ?Muscle aches. ?Nausea and vomiting. ?Sensitivity to light. ?Tiredness or a lack of energy. ?Cough. ?How is this diagnosed? ?This condition may be diagnosed based on your symptoms, your medical history, and a physical exam. You may be asked to touch your chin to your neck to see if this causes pain. You may have tests, such as: ?Lumbar puncture, also called a spinal tap. This is a procedure to remove and  test a sample of the fluid that surrounds your brain and spinal cord. ?Blood tests. ?Other fluid or tissue samples. ?Imaging tests, such as a CT scan or an MRI. ?How is this treated? ?Most types of viral meningitis go away without treatment. It is important to be evaluated by your health care provider to make sure you do not have bacterial meningitis. Bacterial meningitis has similar symptoms, but it is much more dangerous and must be treated quickly with antibiotics. ?You may be given antibiotic medicine through an IV until your health care provider is sure that you do not have bacterial meningitis. The antibiotic will be stopped as soon as viral meningitis is diagnosed. Depending on the type of virus that caused your meningitis, treatment may include: ?Antiretroviral medicine. ?Antiviral medicine. ?Medicines that reduce fever and pain. ?Medicines that reduce swelling (steroids). ?Follow these instructions at home: ?Medicines ?Take over-the-counter and prescription medicines only as told by your health care provider. ?If you were prescribed a medicine for viral meningitis, take it as told by your health care provider. Do not stop taking the medicine even if you start to feel better. ?Infection control ?Wash your hands often with soap and water for at least 20 seconds. If soap and water are not available, use hand sanitizer. ?Disinfect counters and other surfaces after you have touched them. ?Stay home while you are sick, and try to stay away from others as much as possible to avoid spreading the infection. ?Cover your nose and mouth when you sneeze or cough. ?Avoid touching your face. ?General instructions ? ?Drink enough fluid to keep your urine pale yellow. ?Rest at home until you feel  better. Return to your normal activities as told by your health care provider. ?Keep all follow-up visits as told by your health care provider. This is important. ?How is this prevented? ?Get a flu shot (influenza vaccination)  every year. This will help prevent meningitis that is caused by flu viruses. ?Avoid close contact with people who are sick. ?Use insect repellent to prevent mosquito bites. ?Contact a health care provider if: ?Your symptoms do not start to improve after 7-10 days. ?You have a fever that does not get better with medicine. ?Get help right away if: ?Your symptoms get worse. ?You feel confused. ?You have a seizure. ?You are very sleepy or are difficult to wake up. ?These symptoms may represent a serious problem that is an emergency. Do not wait to see if the symptoms will go away. Get medical help right away. Call your local emergency services (911 in the [place] Do not drive yourself to the hospital. ?Summary ?Viral meningitis is an infection of the tissues (meninges) that cover the brain and spinal cord, or an infection in the fluid that surrounds the brain and spinal cord (cerebrospinal fluid). Many common viruses can cause viral meningitis. ?Most people with viral meningitis get better without treatment. However, it is important to be evaluated by your health care provider to make sure you do not have bacterial meningitis. ?Stay home while you are sick, and try to stay away from others as much as possible to avoid spreading the infection. ?If you are taking a medicine for viral meningitis, do not stop taking the medicine even if you start to feel better. ?Get help right away if your symptoms get worse. ?This information is not intended to replace advice given to you by your health care provider. Make sure you discuss any questions you have with your health care provider. ?Document Revised: 11/09/2020 Document Reviewed: 11/09/2020 ?Elsevier Patient Education ? [place]. ? ?

## 2021-10-27 NOTE — Progress Notes (Signed)
? ? ?PATIENT: Casey Brennan ?DOB: 07-Mar-1961 ? ?REASON FOR VISIT: follow up ?HISTORY FROM: patient ? ?Chief Complaint  ?Patient presents with  ? Follow-up  ?  Pt with sister, rm. Intermittently he has dizziness that comes and goes. He is having worsening headaches around 3-4 a week. He initially was started on Ubrelvy for acute management at 50 mg and it helped at first but now doesn't help as much. Has not tried increasing the dose.   ?  ? ?HISTORY OF PRESENT ILLNESS: ? ? ?10-27-2021; RV after December Hospitalization- 12-27 - 22 through 07-07-2021, for viral meningitis.  ?After our last visit he had been showing signs of meningitis, and he was febrile. Had been seen in ED and was send home "to follow up with his Neurologist ". After our visit in Dec he was promptly admited, projectile vomiting, clouded awareness, stiff neck.  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  ? ?After his discharge he had significant improvement in headache and migrainous headaches, no longer dizzy he did not have the same tinnitus that he had for several days nonstop prior and he felt less achy and throbbing.  20 sister then informed me of his snoring and we ordered a HST; ?His home sleep test was performed as a WatchPAT test total recording time of 10 hours 9 minutes was obtained and he slept for 8 hours and 25 minutes of the recording.  He had over 27% REM sleep.  His AHI was very mild 8/h during REM sleep there is always more apnea than in non-REM sleep in his case REM AHI was 10.4 and non-REM AHI was 6.9.  When he slept on his back his AHI rose to 13 an hour when he slept on his side he had literally only 3/h.  Snoring was present for about 20% of the total sleep time and there was no significant oxygen desaturation.  So I thought it was enough to treat him not with any CPAP but to avoid sleeping on his back.  I gave him the  option to try CPAP if he wants.  He has had facial hair and I would have used a nasal CPAP that seals better under the nose.  But but he may have mild apnea only when he is does not sleep on his back there is still the possibility that he has loud snoring in spite of that.   ?So I asked him and his sister and both state he is a mild snorer.  ? ?Headaches were responsive to [place].  ?[place] is 3/ 1/ 1/ 1/ 2/ 3/ 1/ 1/ - every other day a nap 1-2 hours.  ? ? ? ?Interval history 06-22-2021; ? 10/27/21 -I have the pleasure of seeing Casey Brennan today who goes by the name Casey Brennan is a 61 year old gentleman with a history of HIV infection, undetectable viral load and maintained on antiviral therapy for a long time.  He had multiple days of worsening migraine and neck pain a feeling that the left arm and hand were numb and tingly arising from the cervical spine.  He had undergone a anterior fusion and discectomy between see 3 C4 and C5 by Dr. Lynann Bologna.  The surgery took place on September 29 2020-  Unfortunately, there seems to be a C6 dermatome involved now also on the left.  There is headache involving the left side and neck pain and the  patient was febrile.  He had throbbing frontal headaches all the way radiating to the occipital area a possible hyper pressure or high poor pressure.  He was seen in the emergency room on 06-20-2021 by Dr. Tomasa Rand. 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  He also then developed a fever with body aches myalgia he had been vomiting intermittently.  The pain was described by the emergency room physician as a headache particularly if arising with the neck and posterior area of the spot of the skull.  She also overall that he has persistent neck pain again but initially after surgery being pain-free.  He his orthopedic spine surgeon has an appointment with him on Friday.  What remains is that the  patient had fever and significant malaise and myalgia for about a week prior to presenting in the emergency room on Monday night but he is HIV positive and that he has a normal CD4 count.  But that could be very well a bronchitis or asthma or upper respiratory infection.  On Thursday night prior to his ED visit he had been seen by the urgent care.  And it seems that he was evaluated for possible upper respiratory infection.  He was given hydrocodone in the emergency room as his usual medication of Ubrelvy did not deal with these headaches.  His neck pain seems to be dermatomal to there is a good chance that he has actually an underlying infection given the febrile presentation.  He continues to be on gabapentin which I prescribed 300 mg 4 times a day a sleep study had been ordered a while back but has never been done and April is concerned that Neurontin and Zanaflex have increased his tendency to apnea.  I would certainly want him to undergo a sleep study finally he can undergo 1 at home if he cannot imagine sleeping in the sleep lab.  For now he is off the road and I am sure that his DOT license also would appreciate having an examination.  So I would like to add that when he left the emergency room his blood pressure was 144/315 so was a diastolic high blood pressure.  Temperature was 102 Fahrenheit, respirations were 10/min . I reviewed a CT of the head without contrast from 06/20/2021 normal,  ?CT of the neck spine postsurgical changes no acute abnormality.  CT of the cervical spine without contrast.  CT of the abdomen and pelvis with contrast adrenal nodule 2 cm polyp possibly adenoma, this was on the right adrenal gland, liver was unremarkable he had a cholecystectomy in the past pancreas unremarkable mild thickening of the rectum, no bowel obstruction or active inflammation.  Ordered and unremarkable.  History post prostatectomy and appendectomy ? ?The medical doctor stated that the patient is HIV is well  controlled of course he is more prone to have complications with any infections with viral or bacterial he was already given about 7 months or 8 months out from his last cervical discectomy and decompression surgery so osteomyelitis was not suspected.  His headache and has no new neck pain however due to reveal likely another level of nerve impingement.  His low-grade temperature needs to be further evaluated and an MRI of the brain was ordered, but not entered into EPIC.  ? ? ? ? ? ? ? ? ? ? ? ? ?I have the pleasure of meeting  again with [name] , meanwhile 61 years of age. ?I have followed [name] since 2012, by now 9 years, after initially being referred by Dellis Filbert C. [name], MD.  The patient has been retroviral primary neuropathy but he has also presented with headaches intermittently and now feels that numbness and tingling in his feet are progressing for at least a couple of weeks again.  His sister also mentions that he has been very bluish toes.  The patient is a former smoker but quit about 10 years ago.  In his last visit he was taking Ambien 5 mg Xanax 0.5 mg for sleep and he continued to take gabapentin 300 mg 4 times a day for neuropathic pain.  Zanaflex also helps with abortive therapy as well as Imitrex for migraines. ?He is still gainfully employed as a Administrator, has no problems with his handling the paddles, he had no falls. ?  ? ?05-07-2020: AL ?ANSAR SKODA is a 61 y.o. male here today for follow up for worsening headaches and falls. He reports that he fell out of a trailer. He was going to step out of the trailer and foot got caught in Dance movement psychotherapist. He saw orthopaedics this morning who checked him out and felt that everything looked ok. He was given a steroid taper pack that he plans to fill after seeing [place] today.  About 3 weeks prior, he was getting into his truck. He reports that his foot slipped out from underneath him and he hit his head on the door frame of his truck. He  saw his cardiologist about 1-2 weeks later and reports that xray of cervical spine showed arthritis. He is seen by Dr Racheal Patches for chronic neck pain post neck fracture in 2014. He has an appt scheduled with h

## 2021-10-28 DIAGNOSIS — M542 Cervicalgia: Secondary | ICD-10-CM | POA: Diagnosis not present

## 2021-10-31 ENCOUNTER — Telehealth: Payer: Self-pay | Admitting: Neurology

## 2021-10-31 ENCOUNTER — Other Ambulatory Visit: Payer: Self-pay | Admitting: Neurology

## 2021-10-31 MED ORDER — UBRELVY 100 MG PO TABS: 100.0000 mg | ORAL_TABLET | Freq: Two times a day (BID) | ORAL | 5 refills | Status: DC | PRN

## 2021-10-31 NOTE — Telephone Encounter (Signed)
PA submitted for the pt for Ubrelvy 100 mg.  ?FCZ:[id number]  ?Was cancelled by plan stating: ?"This medication or product was previously approved on [id number] from 2021-07-03 to 2022-07-02." ? ?Pt has only been on 50 mg strength. ?

## 2021-11-02 ENCOUNTER — Other Ambulatory Visit: Payer: Self-pay | Admitting: Pulmonary Disease

## 2021-11-10 ENCOUNTER — Ambulatory Visit: Payer: PRIVATE HEALTH INSURANCE | Admitting: Internal Medicine

## 2021-11-11 ENCOUNTER — Other Ambulatory Visit: Payer: Self-pay | Admitting: Pulmonary Disease

## 2021-11-11 ENCOUNTER — Encounter: Payer: Self-pay | Admitting: Internal Medicine

## 2021-11-11 ENCOUNTER — Ambulatory Visit (INDEPENDENT_AMBULATORY_CARE_PROVIDER_SITE_OTHER): Payer: PRIVATE HEALTH INSURANCE | Admitting: Internal Medicine

## 2021-11-11 VITALS — BP 132/74 | HR 68 | Temp 98.6°F | Ht 70.0 in | Wt 214.0 lb

## 2021-11-11 DIAGNOSIS — J449 Chronic obstructive pulmonary disease, unspecified: Secondary | ICD-10-CM

## 2021-11-11 DIAGNOSIS — M792 Neuralgia and neuritis, unspecified: Secondary | ICD-10-CM | POA: Diagnosis not present

## 2021-11-11 DIAGNOSIS — E559 Vitamin D deficiency, unspecified: Secondary | ICD-10-CM

## 2021-11-11 DIAGNOSIS — I10 Essential (primary) hypertension: Secondary | ICD-10-CM | POA: Diagnosis not present

## 2021-11-11 DIAGNOSIS — E782 Mixed hyperlipidemia: Secondary | ICD-10-CM

## 2021-11-11 DIAGNOSIS — G63 Polyneuropathy in diseases classified elsewhere: Secondary | ICD-10-CM | POA: Diagnosis not present

## 2021-11-11 MED ORDER — MECLIZINE HCL 25 MG PO TABS: 25.0000 mg | ORAL_TABLET | Freq: Every day | ORAL | 5 refills | Status: DC | PRN

## 2021-11-11 MED ORDER — CETIRIZINE HCL 10 MG PO TABS: 10.0000 mg | ORAL_TABLET | Freq: Every day | ORAL | 3 refills | Status: DC

## 2021-11-11 NOTE — Assessment & Plan Note (Signed)
Last vitamin D Lab Results  Component Value Date   VD25OH 42.22 08/24/2021   Stable, cont oral replacement  

## 2021-11-11 NOTE — Patient Instructions (Signed)
Please continue all other medications as before, and refills have been done if requested. ? ?Please have the pharmacy call with any other refills you may need. ? ?Please continue your efforts at being more active, low cholesterol diet, and weight control. ? ?You are otherwise up to date with prevention measures today. ? ?Please keep your appointments with your specialists as you may have planned - Dr Benjamine Mola ? ?Please make an Appointment to return in 6 months, or sooner if needed ?

## 2021-11-11 NOTE — Progress Notes (Signed)
Patient ID: Casey Brennan, male   DOB: 04/21/1961, 61 y.o.   MRN: [id number] ? ? ? ?    Chief Complaint: follow up HTN, HLD and low vit d ? ?     HPI:  Casey Brennan is a 61 y.o. male here overall doing ok,  Pt denies chest pain, increased sob or doe, wheezing, orthopnea, PND, increased LE swelling, palpitations, dizziness or syncope.   Pt denies polydipsia, polyuria, or new focal neuro s/s.    Pt denies fever, wt loss, night sweats, loss of appetite, or other constitutional symptoms  No new complaints  Taking Vit D in mvi.   ?      ?Wt Readings from Last 3 Encounters:  ?11/11/21 214 lb (97.1 kg)  ?10/27/21 216 lb (98 kg)  ?08/24/21 212 lb 3.2 oz (96.3 kg)  ? ?BP Readings from Last 3 Encounters:  ?11/11/21 132/74  ?10/27/21 131/84  ?08/24/21 112/80  ? ?      ?Past Medical History:  ?Diagnosis Date  ? Asthma   ? very rare  ? Chronic anemia   ? normocytic  ? Chronic folliculitis   ? Environmental and seasonal allergies   ? H/O pericarditis   ? 2010--  myopercarditis--  resolved  ? Headache(784.0)   ? HX SEVERE FRONTAL HA'S  ? Hiatal hernia   ? History of gastric ulcer   ? History of kidney stones   ? History of MRSA infection 2010  ? infected boil  ? HIV (human immunodeficiency virus infection) ([place]) 1988  ? Hypertension   ? Lytic bone lesion of hip   ? WORK-UP DONE BY ONCOLOGIST DR HA --  NOT MALIGNANT  ? 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 Pneumonia   ? hx of  ? Post concussion syndrome   ? resolved  ? Prostate cancer ([place]) 04/25/2013  ? gleason 3+3=6, volume 30 gm  ? Ulcer   ? hx of gastric  ? ?Past Surgical History:  ?Procedure Laterality Date  ? ACDCF  09/29/2020  ? ANTERIOR CERVICAL DECOMPRESSION/DISCECTOMY FUSION 4 LEVELS N/A 09/29/2020  ? Procedure: ANTERIOR CERVICAL DECOMPRESSION FUSION CERVICAL 3-CERVICAL 4 , CERVICAL 4 - CERVICAL 5, CERVICAL 5 - CERVICAL 6 WITH INSTRUMENTATION AND ALLOGRAFT;  Surgeon: Phylliss Bob, MD;  Location: [facility];  Service: Orthopedics;  Laterality: N/A;  ? CARDIAC  CATHETERIZATION  03-23-2009  DR Burt Knack  ? NORMAL CORONARY ARTERIES  ? CHOLECYSTECTOMY N/A 11/06/2014  ? Procedure: LAPAROSCOPIC CHOLECYSTECTOMY WITH INTRAOPERATIVE CHOLANGIOGRAM;  Surgeon: Judeth Horn, MD;  Location: [place];  Service: General;  Laterality: N/A;  ? COLONOSCOPY  12/26/2011  ? Procedure: COLONOSCOPY;  Surgeon: Lear Ng, MD;  Location: [facility];  Service: Endoscopy;  Laterality: N/A;  ? COLONOSCOPY    ? COLONOSCOPY WITH PROPOFOL N/A 07/29/2014  ? Procedure: COLONOSCOPY WITH PROPOFOL;  Surgeon: Lear Ng, MD;  Location: [facility];  Service: Endoscopy;  Laterality: N/A;  ? CYSTOSCOPY N/A 05/18/2018  ? Procedure: CYSTOSCOPY REMOVAL FOREIGN BODY;  Surgeon: Kathie Rhodes, MD;  Location: [facility];  Service: Urology;  Laterality: N/A;  ? CYSTOSCOPY/RETROGRADE/URETEROSCOPY Bilateral 04/25/2013  ? Procedure: CYSTOSCOPY/ BILATERAL RETROGRADES; BLADDER BIOPSIES;  Surgeon: Alexis Frock, MD;  Location: [facility];  Service: Urology;  Laterality: Bilateral;  ? DENTAL EXAMINATION UNDER ANESTHESIA    ? ESOPHAGOGASTRODUODENOSCOPY  12/26/2011  ? Procedure: ESOPHAGOGASTRODUODENOSCOPY (EGD);  Surgeon: Lear Ng, MD;  Location: [facility];  Service: Endoscopy;  Laterality: N/A;  ? ESOPHAGOGASTRODUODENOSCOPY (EGD) WITH PROPOFOL N/A 07/29/2014  ?  Procedure: ESOPHAGOGASTRODUODENOSCOPY (EGD) WITH PROPOFOL;  Surgeon: Lear Ng, MD;  Location: [place];  Service: Endoscopy;  Laterality: N/A;  ? EXCISION CHRONIC LEFT BREAST ABSCESS  09-21-2010  ? EXCISIONAL BX LEFT BREAST MASS/  I  &  D LEFT BREAST ABSCESS  03-24-2009  ? KNEE ARTHROSCOPY Right 1985  ? left axilla biopsy  04/2013  ? left hip biopsy  10/2012  ? LYMPHADENECTOMY Bilateral 08/18/2013  ? Procedure: LYMPHADENECTOMY "PELVIC LYMPH NODE DISSECTION";  Surgeon: Alexis Frock, MD;  Location: [facility];  Service: Urology;  Laterality: Bilateral;  ? PROSTATE BIOPSY N/A 04/25/2013  ? Procedure: BIOPSY TRANSRECTAL  ULTRASONIC PROSTATE (TUBP);  Surgeon: Alexis Frock, MD;  Location: [facility];  Service: Urology;  Laterality: N/A;  ? ROBOT ASSISTED LAPAROSCOPIC RADICAL PROSTATECTOMY N/A 08/18/2013  ? Procedure: ROBOTIC ASSISTED LAPAROSCOPIC RADICAL PROSTATECTOMY;  Surgeon: Alexis Frock, MD;  Location: [facility];  Service: Urology;  Laterality: N/A;  ? TOTAL KNEE ARTHROPLASTY Right 08/01/2016  ? 08/17/16  ? TOTAL KNEE ARTHROPLASTY Right 08/01/2016  ? Procedure: TOTAL KNEE ARTHROPLASTY;  Surgeon: Renette Butters, MD;  Location: [place];  Service: Orthopedics;  Laterality: Right;  ? UPPER GASTROINTESTINAL ENDOSCOPY    ? UPPER LEG SOFT TISSUE BIOPSY Left 2012  ? lthigh  ? ? reports that he quit smoking about 11 years ago. His smoking use included cigars and cigarettes. He has a 34.50 pack-year smoking history. He has never used smokeless tobacco. He reports that he does not currently use alcohol. He reports that he does not use drugs. ?family history includes Asthma in his father and sister; Cancer in his father and maternal uncle; Diabetes in his father; Hypertension in his sister; Stroke in his father. ?Allergies  ?Allergen Reactions  ? Amoxapine Shortness Of Breath  ? Chocolate Shortness Of Breath and Other (See Comments)  ?  Asthma attack, welts  ? Ciprofloxacin Anaphylaxis, Dermatitis, Itching, Nausea And Vomiting, Shortness Of Breath, Swelling and Hives  ? Descovy [Emtricitabine-Tenofovir Af] Shortness Of Breath  ?  Nasuea, vomiiting, diarrhea, sob, whelps, throat closes  ? Genvoya [Elviteg-Cobic-Emtricit-Tenofaf] Shortness Of Breath  ?  Nausea, vomitting, diarreha, sob, rash, throat closing  ? Morphine And Related Shortness Of Breath, Itching and Other (See Comments)  ?   chest pain  ? Oxycodone-Acetaminophen Shortness Of Breath  ? Penicillins Shortness Of Breath and Rash  ?  Has patient had a PCN reaction causing immediate rash, facial/tongue/throat swelling, SOB or lightheadedness with hypotension: Yes ?Has  patient had a PCN reaction causing severe rash involving mucus membranes or skin necrosis: No ?Has patient had a PCN reaction that required hospitalization No ?Has patient had a PCN reaction occurring within the last 10 years: Yes ?If all of the above answers are "NO", then may proceed with Cephalosporin use.  ? Sulfa Antibiotics Anaphylaxis and Other (See Comments)  ? Tomato Shortness Of Breath  ? Vancomycin Shortness Of Breath and Other (See Comments)  ? Darunavir Itching  ? Carvedilol   ?  LEG SWELLING, DIZZNESS  ? Flagyl [Metronidazole] Itching  ? Milk-Related Compounds   ? Morphine Other (See Comments)  ? Penicillin G Other (See Comments)  ? Toradol [Ketorolac Tromethamine] Nausea And Vomiting  ?  itching  ? Acetaminophen Rash and Other (See Comments)  ?  Pt tolerates percocet and also plain APAP without rash  ? Tramadol Itching, Nausea And Vomiting and Other (See Comments)  ? ?Current Outpatient Medications on File Prior to Visit  ?Medication Sig Dispense Refill  ?  albuterol (PROVENTIL) (2.5 MG/3ML) 0.083% nebulizer solution Take 3 mLs (2.5 mg total) by nebulization every 6 (six) hours as needed for wheezing or shortness of breath. 75 mL 3  ? albuterol (VENTOLIN HFA) 108 (90 Base) MCG/ACT inhaler Inhale 2 puffs into the lungs every 6 (six) hours as needed for wheezing or shortness of breath. 36 g 3  ? ALPRAZolam (XANAX) 0.5 MG tablet TAKE 1 TABLET(0.5 MG) BY MOUTH TWICE DAILY AS NEEDED FOR SLEEP 60 tablet 0  ? Beclomethasone Dipropionate (QNASL) 80 MCG/ACT AERS Place 2 sprays into the nose daily. 10.6 g 2  ? bictegravir-emtricitabine-tenofovir AF (BIKTARVY) 50-200-25 MG TABS tablet Take 1 tablet by mouth daily. 30 tablet 11  ? Budeson-Glycopyrrol-Formoterol (BREZTRI AEROSPHERE) 160-9-4.8 MCG/ACT AERO INHALE 2 PUFFS INTO THE LUNGS IN THE MORNING AND AT BEDTIME 10.7 g 0  ? Cholecalciferol (VITAMIN D3) 250 MCG (10000 UT) capsule Take 1 tablet by mouth daily. [employer]    ? cyanocobalamin (,VITAMIN B-12,) 1000  MCG/ML injection Inject 1 mL (1,000 mcg total) into the muscle every 30 (thirty) days. 3 mL 3  ? diphenhydrAMINE (BENADRYL) 25 MG tablet Take 1 tablet (25 mg total) by mouth every 6 (six) hours as needed. (Patient takin

## 2021-11-13 ENCOUNTER — Encounter: Payer: Self-pay | Admitting: Internal Medicine

## 2021-11-13 NOTE — Assessment & Plan Note (Signed)
BP Readings from Last 3 Encounters:  ?11/11/21 132/74  ?10/27/21 131/84  ?08/24/21 112/80  ? ?Stable, pt to continue medical treatment  - diet ? ?

## 2021-11-13 NOTE — Assessment & Plan Note (Signed)
Stable overall, for inhaler prn,  to f/u any worsening symptoms or concerns 

## 2021-11-13 NOTE — Assessment & Plan Note (Signed)
Lab Results  ?Component Value Date  ? [place] 89 08/24/2021  ? ?Stable, pt to continue current statin crestor 20 ? ? ?

## 2021-11-24 ENCOUNTER — Ambulatory Visit (INDEPENDENT_AMBULATORY_CARE_PROVIDER_SITE_OTHER): Payer: PRIVATE HEALTH INSURANCE | Admitting: Internal Medicine

## 2021-11-24 ENCOUNTER — Encounter: Payer: Self-pay | Admitting: Internal Medicine

## 2021-11-24 VITALS — BP 138/88 | HR 78 | Temp 98.6°F | Ht 70.0 in | Wt 211.0 lb

## 2021-11-24 DIAGNOSIS — I10 Essential (primary) hypertension: Secondary | ICD-10-CM | POA: Diagnosis not present

## 2021-11-24 DIAGNOSIS — M542 Cervicalgia: Secondary | ICD-10-CM | POA: Diagnosis not present

## 2021-11-24 DIAGNOSIS — N2889 Other specified disorders of kidney and ureter: Secondary | ICD-10-CM

## 2021-11-24 DIAGNOSIS — N182 Chronic kidney disease, stage 2 (mild): Secondary | ICD-10-CM

## 2021-11-24 DIAGNOSIS — L02511 Cutaneous abscess of right hand: Secondary | ICD-10-CM

## 2021-11-24 DIAGNOSIS — J45909 Unspecified asthma, uncomplicated: Secondary | ICD-10-CM | POA: Diagnosis not present

## 2021-11-24 DIAGNOSIS — L03011 Cellulitis of right finger: Secondary | ICD-10-CM | POA: Diagnosis not present

## 2021-11-24 MED ORDER — METOPROLOL SUCCINATE ER 100 MG PO TB24: 100.0000 mg | ORAL_TABLET | Freq: Every day | ORAL | 3 refills | Status: DC

## 2021-11-24 MED ORDER — DOXYCYCLINE HYCLATE 100 MG PO TABS: 100.0000 mg | ORAL_TABLET | Freq: Two times a day (BID) | ORAL | 0 refills | Status: DC

## 2021-11-24 MED ORDER — HYDRALAZINE HCL 25 MG PO TABS: 25.0000 mg | ORAL_TABLET | Freq: Three times a day (TID) | ORAL | 3 refills | Status: DC

## 2021-11-24 NOTE — Assessment & Plan Note (Signed)
Stable overall, cont inhaler prn 

## 2021-11-24 NOTE — Assessment & Plan Note (Signed)
Mild to mod, for doxy course, and refer hand surgury asap

## 2021-11-24 NOTE — Assessment & Plan Note (Signed)
Lab Results  Component Value Date   CREATININE 1.12 08/24/2021   Stable overall, cont to avoid nephrotoxins

## 2021-11-24 NOTE — Patient Instructions (Signed)
Please take all new medication as prescribed -the hydralazine  Please take all new medication as prescribed - the antibiotic  You will be contacted regarding the referral for: hand surgury asap  Please continue all other medications as before, and refills have been done if requested.  Please have the pharmacy call with any other refills you may need.  Please continue your efforts at being more active, low cholesterol diet, and weight control.  Please keep your appointments with your specialists as you may have planned

## 2021-11-24 NOTE — Assessment & Plan Note (Signed)
BP Readings from Last 3 Encounters:  11/24/21 138/88  11/11/21 132/74  10/27/21 131/84   Persistent mild uncontrolled, pt to continue medical treatment toprol xl 100 and add hydralzine 25 tid

## 2021-11-24 NOTE — Progress Notes (Signed)
Patient ID: Casey Brennan, male   DOB: 10/16/1960, 61 y.o.   MRN: [id number]        Chief Complaint: follow up HTN, 3rd finger infection, ckd , asthma       HPI:  Casey Brennan is a 61 y.o. male here with c/o 3rd finger right hand infection with pus non draining yet, started near the edge of the nail, with now marked swelling and pain, but fever chills or drainage  no red streaks  or more proximal finger redness. Pt denies chest pain, increased sob or doe, wheezing, orthopnea, PND, increased LE swelling, palpitations, dizziness or syncope. But BP at home remains borderline or slightly elevated at times.  As not tolerated coreg or amlodipine in the past.  Currently taking once daily toprol xl 100 and tolerating ok.         Wt Readings from Last 3 Encounters:  11/24/21 211 lb (95.7 kg)  11/11/21 214 lb (97.1 kg)  10/27/21 216 lb (98 kg)   BP Readings from Last 3 Encounters:  11/24/21 138/88  11/11/21 132/74  10/27/21 131/84         Past Medical History:  Diagnosis Date   Asthma    very rare   Chronic anemia    normocytic   Chronic folliculitis    Environmental and seasonal allergies    H/O pericarditis    2010--  myopercarditis--  resolved   Headache(784.0)    HX SEVERE FRONTAL HA'S   Hiatal hernia    History of gastric ulcer    History of kidney stones    History of MRSA infection 2010   infected boil   HIV (human immunodeficiency virus infection) ([place]) 1988   Hypertension    Lytic bone lesion of hip    WORK-UP DONE BY ONCOLOGIST DR HA --  NOT MALIGNANT   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   Pneumonia    hx of   Post concussion syndrome    resolved   Prostate cancer ([place]) 04/25/2013   gleason 3+3=6, volume 30 gm   Ulcer    hx of gastric   Past Surgical History:  Procedure Laterality Date   ACDCF  09/29/2020   ANTERIOR CERVICAL DECOMPRESSION/DISCECTOMY FUSION 4 LEVELS N/A 09/29/2020   Procedure: ANTERIOR CERVICAL DECOMPRESSION FUSION CERVICAL 3-CERVICAL 4 ,  CERVICAL 4 - CERVICAL 5, CERVICAL 5 - CERVICAL 6 WITH INSTRUMENTATION AND ALLOGRAFT;  Surgeon: Phylliss Bob, MD;  Location: [facility];  Service: Orthopedics;  Laterality: N/A;   CARDIAC CATHETERIZATION  03-23-2009  DR COOPER   NORMAL CORONARY ARTERIES   CHOLECYSTECTOMY N/A 11/06/2014   Procedure: LAPAROSCOPIC CHOLECYSTECTOMY WITH INTRAOPERATIVE CHOLANGIOGRAM;  Surgeon: Judeth Horn, MD;  Location: [facility];  Service: General;  Laterality: N/A;   COLONOSCOPY  12/26/2011   Procedure: COLONOSCOPY;  Surgeon: Lear Ng, MD;  Location: [facility];  Service: Endoscopy;  Laterality: N/A;   COLONOSCOPY     COLONOSCOPY WITH PROPOFOL N/A 07/29/2014   Procedure: COLONOSCOPY WITH PROPOFOL;  Surgeon: Lear Ng, MD;  Location: [facility];  Service: Endoscopy;  Laterality: N/A;   CYSTOSCOPY N/A 05/18/2018   Procedure: CYSTOSCOPY REMOVAL FOREIGN BODY;  Surgeon: Kathie Rhodes, MD;  Location: [facility];  Service: Urology;  Laterality: N/A;   CYSTOSCOPY/RETROGRADE/URETEROSCOPY Bilateral 04/25/2013   Procedure: CYSTOSCOPY/ BILATERAL RETROGRADES; BLADDER BIOPSIES;  Surgeon: Alexis Frock, MD;  Location: [facility];  Service: Urology;  Laterality: Bilateral;   DENTAL EXAMINATION UNDER ANESTHESIA     ESOPHAGOGASTRODUODENOSCOPY  12/26/2011  Procedure: ESOPHAGOGASTRODUODENOSCOPY (EGD);  Surgeon: Lear Ng, MD;  Location: [facility];  Service: Endoscopy;  Laterality: N/A;   ESOPHAGOGASTRODUODENOSCOPY (EGD) WITH PROPOFOL N/A 07/29/2014   Procedure: ESOPHAGOGASTRODUODENOSCOPY (EGD) WITH PROPOFOL;  Surgeon: Lear Ng, MD;  Location: [place];  Service: Endoscopy;  Laterality: N/A;   EXCISION CHRONIC LEFT BREAST ABSCESS  09-21-2010   EXCISIONAL BX LEFT BREAST MASS/  I  &  D LEFT BREAST ABSCESS  03-24-2009   KNEE ARTHROSCOPY Right 1985   left axilla biopsy  04/2013   left hip biopsy  10/2012   LYMPHADENECTOMY Bilateral 08/18/2013   Procedure: LYMPHADENECTOMY "PELVIC LYMPH  NODE DISSECTION";  Surgeon: Alexis Frock, MD;  Location: [facility];  Service: Urology;  Laterality: Bilateral;   PROSTATE BIOPSY N/A 04/25/2013   Procedure: BIOPSY TRANSRECTAL ULTRASONIC PROSTATE (TUBP);  Surgeon: Alexis Frock, MD;  Location[facility];  Service: Urology;  Laterality: N/A;   ROBOT ASSISTED LAPAROSCOPIC RADICAL PROSTATECTOMY N/A 08/18/2013   Procedure: ROBOTIC ASSISTED LAPAROSCOPIC RADICAL PROSTATECTOMY;  Surgeon: Alexis Frock, MD;  Location: [facility];  Service: Urology;  Laterality: N/A;   TOTAL KNEE ARTHROPLASTY Right 08/01/2016   08/17/16   TOTAL KNEE ARTHROPLASTY Right 08/01/2016   Procedure: TOTAL KNEE ARTHROPLASTY;  Surgeon: Renette Butters, MD;  Location: [place];  Service: Orthopedics;  Laterality: Right;   UPPER GASTROINTESTINAL ENDOSCOPY     UPPER LEG SOFT TISSUE BIOPSY Left 2012   lthigh    reports that he quit smoking about 11 years ago. His smoking use included cigars and cigarettes. He has a 34.50 pack-year smoking history. He has never used smokeless tobacco. He reports that he does not currently use alcohol. He reports that he does not use drugs. family history includes Asthma in his father and sister; Cancer in his father and maternal uncle; Diabetes in his father; Hypertension in his sister; Stroke in his father. Allergies  Allergen Reactions   Amoxapine Shortness Of Breath   Chocolate Shortness Of Breath and Other (See Comments)    Asthma attack, welts   Ciprofloxacin Anaphylaxis, Dermatitis, Itching, Nausea And Vomiting, Shortness Of Breath, Swelling and Hives   Descovy [Emtricitabine-Tenofovir Af] Shortness Of Breath    Nasuea, vomiiting, diarrhea, sob, whelps, throat closes   Genvoya [Elviteg-Cobic-Emtricit-Tenofaf] Shortness Of Breath    Nausea, vomitting, diarreha, sob, rash, throat closing   Morphine And Related Shortness Of Breath, Itching and Other (See Comments)     chest pain   Oxycodone-Acetaminophen Shortness Of Breath    Penicillins Shortness Of Breath and Rash    Has patient had a PCN reaction causing immediate rash, facial/tongue/throat swelling, SOB or lightheadedness with hypotension: Yes Has patient had a PCN reaction causing severe rash involving mucus membranes or skin necrosis: No Has patient had a PCN reaction that required hospitalization No Has patient had a PCN reaction occurring within the last 10 years: Yes If all of the above answers are "NO", then may proceed with Cephalosporin use.   Sulfa Antibiotics Anaphylaxis and Other (See Comments)   Tomato Shortness Of Breath   Vancomycin Shortness Of Breath and Other (See Comments)   Darunavir Itching   Amlodipine Other (See Comments)    swelling   Carvedilol     LEG SWELLING, DIZZNESS   Flagyl [Metronidazole] Itching   Milk-Related Compounds    Morphine Other (See Comments)   Penicillin G Other (See Comments)   Toradol [Ketorolac Tromethamine] Nausea And Vomiting    itching   Acetaminophen Rash and Other (See Comments)  Pt tolerates percocet and also plain APAP without rash   Tramadol Itching, Nausea And Vomiting and Other (See Comments)   Current Outpatient Medications on File Prior to Visit  Medication Sig Dispense Refill   albuterol (PROVENTIL) (2.5 MG/3ML) 0.083% nebulizer solution Take 3 mLs (2.5 mg total) by nebulization every 6 (six) hours as needed for wheezing or shortness of breath. 75 mL 3   albuterol (VENTOLIN HFA) 108 (90 Base) MCG/ACT inhaler Inhale 2 puffs into the lungs every 6 (six) hours as needed for wheezing or shortness of breath. 36 g 3   ALPRAZolam (XANAX) 0.5 MG tablet TAKE 1 TABLET(0.5 MG) BY MOUTH TWICE DAILY AS NEEDED FOR SLEEP 60 tablet 0   Beclomethasone Dipropionate (QNASL) 80 MCG/ACT AERS Place 2 sprays into the nose daily. 10.6 g 2   bictegravir-emtricitabine-tenofovir AF (BIKTARVY) 50-200-25 MG TABS tablet Take 1 tablet by mouth daily. 30 tablet 11   Budeson-Glycopyrrol-Formoterol (BREZTRI AEROSPHERE)  160-9-4.8 MCG/ACT AERO INHALE 2 PUFFS INTO THE LUNGS IN THE MORNING AND AT BEDTIME 10.7 g 0   cetirizine (ZYRTEC) 10 MG tablet Take 1 tablet (10 mg total) by mouth daily. 90 tablet 3   Cholecalciferol (VITAMIN D3) 250 MCG (10000 UT) capsule Take 1 tablet by mouth daily. [employer]     cyanocobalamin (,VITAMIN B-12,) 1000 MCG/ML injection Inject 1 mL (1,000 mcg total) into the muscle every 30 (thirty) days. 3 mL 3   diphenhydrAMINE (BENADRYL) 25 MG tablet Take 1 tablet (25 mg total) by mouth every 6 (six) hours as needed. (Patient taking differently: Take 25 mg by mouth every 6 (six) hours as needed for sleep, itching or allergies.) 30 tablet 0   EPINEPHrine 0.3 mg/0.3 mL IJ SOAJ injection Inject 0.3 mg into the muscle as needed for anaphylaxis. 1 each 5   Glucos-Chond-Hyal Ac-Ca Fructo (MOVE FREE JOINT HEALTH ADVANCE PO) Take 1 tablet by mouth daily.     hydrOXYzine (ATARAX) 25 MG tablet TAKE 1 TABLET(25 MG) BY MOUTH AT BEDTIME 90 tablet 3   meclizine (ANTIVERT) 25 MG tablet Take 1 tablet (25 mg total) by mouth daily as needed. 40 tablet 5   methocarbamol (ROBAXIN) 500 MG tablet Take 500 mg by mouth 4 (four) times daily.     montelukast (SINGULAIR) 10 MG tablet Take 10 mg by mouth daily.     Multiple Vitamin (MULTIVITAMIN WITH MINERALS) TABS tablet Take 1 tablet by mouth daily after breakfast.      mupirocin ointment (BACTROBAN) 2 % APPLY EXTERNALLY TO THE AFFECTED AREA TWICE DAILY FOR 3 WEEKS, PLACE COTTON BETWEEN TOES TO ALLOW BETTER AERATION AS NEEDED (Patient taking differently: Apply 1 application. topically See admin instructions. APPLY EXTERNALLY TO THE AFFECTED AREA TWICE DAILY FOR 3 WEEKS, PLACE COTTON BETWEEN TOES TO ALLOW BETTER AERATION AS NEEDED) 66 g 1   omeprazole (PRILOSEC) 40 MG capsule Take 1 capsule (40 mg total) by mouth 2 (two) times daily. 180 capsule 2   ondansetron (ZOFRAN-ODT) 4 MG disintegrating tablet Take 1 tablet (4 mg total) by mouth every 8 (eight) hours as needed for  nausea or vomiting. 30 tablet 2   OVER THE COUNTER MEDICATION Take 2 capsules by mouth daily. Zhou: thyroid supplement     rosuvastatin (CRESTOR) 20 MG tablet Take 1 tablet (20 mg total) by mouth daily. 90 tablet 3   tizanidine (ZANAFLEX) 6 MG capsule TAKE 1 CAPSULE(6 MG) BY MOUTH THREE TIMES DAILY 90 capsule 11   torsemide (DEMADEX) 20 MG tablet Take 10 mg by mouth  See admin instructions. Tuesday Thursday Saturday     Ubrogepant (UBRELVY) 100 MG TABS Take 100 mg by mouth 2 (two) times daily as needed (can take a seecond dose 2 hiurs after first do not exceed 2 a day.). 10 tablet 5   [DISCONTINUED] zolpidem (AMBIEN) 5 MG tablet Take 1-2 tablets (5-10 mg total) by mouth at bedtime as needed for sleep. (Patient not taking: Reported on 04/23/2019) 30 tablet 2   No current facility-administered medications on file prior to visit.        ROS:  All others reviewed and negative.  Objective        PE:  BP 138/88 (BP Location: Right Arm, Patient Position: Sitting, Cuff Size: Large)   Pulse 78   Temp 98.6 F (37 C) (Oral)   Ht '5\' 10"'$  (1.778 m)   Wt 211 lb (95.7 kg)   SpO2 91%   BMI 30.28 kg/m                 Constitutional: Pt appears in NAD               HENT: Head: NCAT.                Right Ear: External ear normal.                 Left Ear: External ear normal.                Eyes: . Pupils are equal, round, and reactive to light. Conjunctivae and EOM are normal               Nose: without d/c or deformity               Neck: Neck supple. Gross normal ROM               Cardiovascular: Normal rate and regular rhythm.                 Pulmonary/Chest: Effort normal and breath sounds without rales or wheezing.                              Neurological: Pt is alert. At baseline orientation, motor grossly intact               Skin:  LE edema - none; 3rd finger right hand medial distal finger with approx 15 mm abscess non draining with mild surrounding erythema               Psychiatric: Pt  behavior is normal without agitation   Micro: none  Cardiac tracings I have personally interpreted today:  none  Pertinent Radiological findings (summarize): none   Lab Results  Component Value Date   WBC 4.7 08/24/2021   HGB 12.1 (L) 08/24/2021   HCT 38.6 (L) 08/24/2021   PLT 182.0 08/24/2021   GLUCOSE 99 08/24/2021   CHOL 155 08/24/2021   TRIG 98.0 08/24/2021   HDL 46.40 08/24/2021   LDLCALC 89 08/24/2021   ALT 13 08/24/2021   AST 16 08/24/2021   NA 135 08/24/2021   K 3.7 08/24/2021   CL 103 08/24/2021   CREATININE 1.12 08/24/2021   BUN 11 08/24/2021   CO2 31 08/24/2021   TSH 1.48 08/24/2021   PSA 0.00 (L) 08/24/2021   INR 1.0 06/23/2021   HGBA1C <4.0 08/25/2021   MICROALBUR 16.88 (H) 10/05/2011   Assessment/Plan:  Casey Brennan  is a 61 y.o. 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  Essential hypertension BP Readings from Last 3 Encounters:  11/24/21 138/88  11/11/21 132/74  10/27/21 131/84   Persistent mild uncontrolled, pt to continue medical treatment toprol xl 100 and add hydralzine 25 tid   Chronic renal insufficiency, stage 2 (mild) Lab Results  Component Value Date   CREATININE 1.12 08/24/2021   Stable overall, cont to avoid nephrotoxins   Asthma Stable overall, cont inhaler prn  Finger pulp abscess, right Mild to mod, for doxy course, and refer hand surgury asap  Followup: Return if symptoms worsen or fail to  improve.  Cathlean Cower, MD 11/24/2021 2:48 PM [facility]

## 2021-12-06 DIAGNOSIS — J3089 Other allergic rhinitis: Secondary | ICD-10-CM | POA: Diagnosis not present

## 2021-12-06 DIAGNOSIS — J4530 Mild persistent asthma, uncomplicated: Secondary | ICD-10-CM | POA: Diagnosis not present

## 2021-12-06 DIAGNOSIS — J449 Chronic obstructive pulmonary disease, unspecified: Secondary | ICD-10-CM | POA: Diagnosis not present

## 2021-12-06 DIAGNOSIS — J301 Allergic rhinitis due to pollen: Secondary | ICD-10-CM | POA: Diagnosis not present

## 2021-12-07 DIAGNOSIS — M4322 Fusion of spine, cervical region: Secondary | ICD-10-CM | POA: Diagnosis not present

## 2021-12-08 ENCOUNTER — Ambulatory Visit: Payer: PRIVATE HEALTH INSURANCE | Admitting: Orthopedic Surgery

## 2021-12-12 ENCOUNTER — Encounter: Payer: Self-pay | Admitting: Infectious Diseases

## 2021-12-14 ENCOUNTER — Telehealth: Payer: Self-pay | Admitting: Internal Medicine

## 2021-12-14 NOTE — Telephone Encounter (Signed)
Allergist prescribed qnasal but MD informed pt that if allergist could not refill it then he would.   States medication requires a PA.   Please call pt to follow up ASAP.

## 2021-12-22 ENCOUNTER — Ambulatory Visit: Payer: PRIVATE HEALTH INSURANCE | Admitting: Orthopedic Surgery

## 2021-12-27 ENCOUNTER — Other Ambulatory Visit: Payer: Self-pay | Admitting: Neurology

## 2021-12-27 DIAGNOSIS — G4700 Insomnia, unspecified: Secondary | ICD-10-CM

## 2022-01-04 DIAGNOSIS — M4322 Fusion of spine, cervical region: Secondary | ICD-10-CM | POA: Diagnosis not present

## 2022-01-04 DIAGNOSIS — M47812 Spondylosis without myelopathy or radiculopathy, cervical region: Secondary | ICD-10-CM | POA: Diagnosis not present

## 2022-01-05 DIAGNOSIS — B351 Tinea unguium: Secondary | ICD-10-CM | POA: Diagnosis not present

## 2022-01-05 DIAGNOSIS — G63 Polyneuropathy in diseases classified elsewhere: Secondary | ICD-10-CM | POA: Diagnosis not present

## 2022-01-06 ENCOUNTER — Ambulatory Visit: Payer: PRIVATE HEALTH INSURANCE | Admitting: Infectious Diseases

## 2022-01-06 ENCOUNTER — Ambulatory Visit: Payer: PRIVATE HEALTH INSURANCE | Admitting: Internal Medicine

## 2022-01-06 DIAGNOSIS — H401114 Primary open-angle glaucoma, right eye, indeterminate stage: Secondary | ICD-10-CM | POA: Diagnosis not present

## 2022-01-06 DIAGNOSIS — Z79899 Other long term (current) drug therapy: Secondary | ICD-10-CM | POA: Diagnosis not present

## 2022-01-06 DIAGNOSIS — H40022 Open angle with borderline findings, high risk, left eye: Secondary | ICD-10-CM | POA: Diagnosis not present

## 2022-01-13 ENCOUNTER — Ambulatory Visit: Payer: Medicare Other | Admitting: Cardiology

## 2022-01-25 ENCOUNTER — Other Ambulatory Visit (HOSPITAL_COMMUNITY)
Admission: RE | Admit: 2022-01-25 | Discharge: 2022-01-25 | Disposition: A | Discharge status: Home / Self Care | Payer: Medicare Other | Source: Ambulatory Visit | Attending: Infectious Diseases | Admitting: Infectious Diseases

## 2022-01-25 ENCOUNTER — Ambulatory Visit (INDEPENDENT_AMBULATORY_CARE_PROVIDER_SITE_OTHER): Payer: PRIVATE HEALTH INSURANCE | Admitting: Infectious Diseases

## 2022-01-25 VITALS — BP 129/96 | HR 103 | Temp 98.4°F | Wt 210.6 lb

## 2022-01-25 DIAGNOSIS — G43109 Migraine with aura, not intractable, without status migrainosus: Secondary | ICD-10-CM

## 2022-01-25 DIAGNOSIS — E538 Deficiency of other specified B group vitamins: Secondary | ICD-10-CM | POA: Diagnosis not present

## 2022-01-25 DIAGNOSIS — Z113 Encounter for screening for infections with a predominantly sexual mode of transmission: Secondary | ICD-10-CM | POA: Diagnosis present

## 2022-01-25 DIAGNOSIS — Z79899 Other long term (current) drug therapy: Secondary | ICD-10-CM

## 2022-01-25 DIAGNOSIS — Z87891 Personal history of nicotine dependence: Secondary | ICD-10-CM | POA: Diagnosis not present

## 2022-01-25 DIAGNOSIS — Z21 Asymptomatic human immunodeficiency virus [HIV] infection status: Secondary | ICD-10-CM | POA: Diagnosis not present

## 2022-01-25 DIAGNOSIS — J449 Chronic obstructive pulmonary disease, unspecified: Secondary | ICD-10-CM | POA: Diagnosis not present

## 2022-01-25 MED ORDER — CYANOCOBALAMIN 1000 MCG/ML IJ SOLN
1000.0000 ug | Freq: Once | INTRAMUSCULAR | Status: AC
  Administered 2022-01-25: 1000 ug via INTRAMUSCULAR

## 2022-01-25 MED ORDER — ALBUTEROL SULFATE HFA 108 (90 BASE) MCG/ACT IN AERS: 2.0000 | INHALATION_SPRAY | Freq: Four times a day (QID) | RESPIRATORY_TRACT | 3 refills | Status: DC | PRN

## 2022-01-25 MED ORDER — ALBUTEROL SULFATE (2.5 MG/3ML) 0.083% IN NEBU: 2.5000 mg | INHALATION_SOLUTION | Freq: Four times a day (QID) | RESPIRATORY_TRACT | 3 refills | Status: DC | PRN

## 2022-01-25 NOTE — Progress Notes (Signed)
Subjective:    Patient ID: Casey Brennan, male  DOB: [birth year], 61 y.o.        MRN: [id number]   HPI 60 yo M with hx HIV+ (2012), COPD Gold,  and prostate Ca.  Had radical prostatectomy 08-18-13, adenoCA, LN (-), margins (-).  Cont to have uro f/u (next 04-2022).  Had R knee arthroscopy 1985, prev MRI and has severe osteoarthritis.  Had R TKR 08-2016   Was changed to [place], prezista (05-2016). He developed a rash from the sulfa in the darunavir, changed back to his prev rx.  Prev ? Allergy to descovey (more likely darunavir).He was previously changed to biktarvy from ISN/TRV .    09-29-20: ANTERIOR CERVICAL DECOMPRESSION FUSION CERVICAL 3-CERVICAL 4 , CERVICAL 4 - CERVICAL 5, CERVICAL 5 - CERVICAL 6 WITH INSTRUMENTATION AND ALLOGRAFT His arm pain has since resolved.    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 He has been traveling to [place] and all over lately.  He had meningitis 06-2021. HSV, VZV PCR (-), crypto (-).    Has seen by ophtho- concern for cataracts, glaucoma. Going to [employer].   HIV 1 RNA Quant  Date Value  06/29/2021 597 Copies/mL (H)  11/11/2020 NOT DETECTED copies/mL  03/11/2020 <20 Copies/mL   CD4 T Cell Abs (/uL)  Date Value  11/11/2020 659  03/11/2020 479  07/07/2019 580     Health Maintenance  Topic Date Due  . Zoster Vaccines- Shingrix (2 of 2) 02/24/2022 (Originally 02/21/2021)  . TETANUS/TDAP  07/11/2022 (Originally 06/11/2021)  . INFLUENZA VACCINE  01/31/2022  . COLONOSCOPY (Pts 45-26yr Insurance coverage will need to be confirmed)  12/25/2028  . COVID-19 Vaccine  Completed  . Hepatitis C Screening  Completed  . HIV Screening  Completed  . HPV VACCINES  Aged Out      Review of Systems  Constitutional:  Negative for chills, fever and weight loss.  Eyes:  Negative for blurred vision and double vision.       "Beginning stage of glaucoma"  Respiratory:  Negative for cough and shortness of breath.   Gastrointestinal:  Negative  for constipation and diarrhea.  Genitourinary:  Negative for dysuria.  Neurological:  Positive for dizziness and headaches (feels like there is a football game on the R side of his head.).   Please see HPI. All other systems reviewed and negative.     Objective:  Physical Exam Vitals reviewed.  Constitutional:      General: He is not in acute distress.    Appearance: Normal appearance. He is not ill-appearing or diaphoretic.  HENT:     Mouth/Throat:     Mouth: Mucous membranes are moist.     Pharynx: Oropharynx is clear.  Cardiovascular:     Rate and Rhythm: Normal rate and regular rhythm.  Pulmonary:     Effort: Pulmonary effort is normal.     Breath sounds: Normal breath sounds.  Abdominal:     General: Bowel sounds are normal. There is no distension.     Palpations: Abdomen is soft.     Tenderness: There is no abdominal tenderness.  Musculoskeletal:        General: Normal range of motion.     Cervical back: Normal range of motion and neck supple.     Right lower leg: No edema.     Left lower leg: No edema.  Neurological:     General: No focal deficit present.     Mental Status: He  is alert.          Assessment & Plan:

## 2022-01-25 NOTE — Assessment & Plan Note (Signed)
Relates to his previous neck surgery He is going to f/u with surgeon.

## 2022-01-25 NOTE — Assessment & Plan Note (Signed)
He is doing well Will check his labs today Encouraged him to get his second shingles vaccine. He will rtc in 9 months Will call about the upcoming covid/flu vax.

## 2022-01-25 NOTE — Addendum Note (Signed)
Addended by: Marcelino Duster on: 01/25/2022 04:30 PM   Modules accepted: Orders

## 2022-01-26 LAB — CBC
Hematocrit: 40.1 % (ref 37.5–51.0)
Hemoglobin: 13.6 g/dL (ref 13.0–17.7)
MCH: 31.3 pg (ref 26.6–33.0)
MCHC: 33.9 g/dL (ref 31.5–35.7)
MCV: 92 fL (ref 79–97)
Platelets: 167 10*3/uL (ref 150–450)
RBC: 4.34 x10E6/uL (ref 4.14–5.80)
RDW: 13.8 % (ref 11.6–15.4)
WBC: 3.9 10*3/uL (ref 3.4–10.8)

## 2022-01-26 LAB — COMPREHENSIVE METABOLIC PANEL
ALT: 16 IU/L (ref 0–44)
AST: 17 IU/L (ref 0–40)
Albumin/Globulin Ratio: 1.6 (ref 1.2–2.2)
Albumin: 4.3 g/dL (ref 3.8–4.9)
Alkaline Phosphatase: 123 IU/L — ABNORMAL HIGH (ref 44–121)
BUN/Creatinine Ratio: 7 — ABNORMAL LOW (ref 9–20)
BUN: 8 mg/dL (ref 6–24)
Bilirubin Total: 0.5 mg/dL (ref 0.0–1.2)
CO2: 20 mmol/L (ref 20–29)
Calcium: 9.2 mg/dL (ref 8.7–10.2)
Chloride: 107 mmol/L — ABNORMAL HIGH (ref 96–106)
Creatinine, Ser: 1.14 mg/dL (ref 0.76–1.27)
Globulin, Total: 2.7 g/dL (ref 1.5–4.5)
Glucose: 88 mg/dL (ref 70–99)
Potassium: 3.9 mmol/L (ref 3.5–5.2)
Sodium: 143 mmol/L (ref 134–144)
Total Protein: 7 g/dL (ref 6.0–8.5)
eGFR: 75 mL/min/{1.73_m2} (ref >59)

## 2022-01-26 LAB — RPR: RPR Ser Ql: NONREACTIVE

## 2022-01-26 LAB — URINE CYTOLOGY ANCILLARY ONLY
Chlamydia: NEGATIVE
Comment: NEGATIVE
Comment: NORMAL
Neisseria Gonorrhea: NEGATIVE

## 2022-01-26 LAB — HIV-1 RNA QUANT-NO REFLEX-BLD
HIV-1 RNA Viral Load Log: 4.461 log10copy/mL
HIV-1 RNA Viral Load: 28900 copies/mL

## 2022-01-27 LAB — T-HELPER CELLS (CD4) COUNT (NOT AT ARMC)
CD4 % Helper T Cell: 27 % — ABNORMAL LOW (ref 33–65)
CD4 T Cell Abs: 332 /uL — ABNORMAL LOW (ref 400–1790)

## 2022-01-31 ENCOUNTER — Other Ambulatory Visit: Payer: Self-pay | Admitting: Infectious Diseases

## 2022-01-31 DIAGNOSIS — Z21 Asymptomatic human immunodeficiency virus [HIV] infection status: Secondary | ICD-10-CM

## 2022-02-08 ENCOUNTER — Ambulatory Visit: Payer: Medicare Other | Admitting: Cardiology

## 2022-02-08 ENCOUNTER — Other Ambulatory Visit: Payer: Self-pay | Admitting: Cardiology

## 2022-02-08 ENCOUNTER — Other Ambulatory Visit: Payer: Self-pay

## 2022-02-08 ENCOUNTER — Encounter: Payer: Self-pay | Admitting: Cardiology

## 2022-02-08 VITALS — BP 144/91 | HR 63 | Temp 98.0°F | Resp 16 | Ht 70.0 in | Wt 213.0 lb

## 2022-02-08 DIAGNOSIS — I10 Essential (primary) hypertension: Secondary | ICD-10-CM | POA: Diagnosis not present

## 2022-02-08 DIAGNOSIS — G44229 Chronic tension-type headache, not intractable: Secondary | ICD-10-CM

## 2022-02-08 DIAGNOSIS — R072 Precordial pain: Secondary | ICD-10-CM | POA: Insufficient documentation

## 2022-02-08 DIAGNOSIS — B20 Human immunodeficiency virus [HIV] disease: Secondary | ICD-10-CM

## 2022-02-08 DIAGNOSIS — R252 Cramp and spasm: Secondary | ICD-10-CM

## 2022-02-08 DIAGNOSIS — G250 Essential tremor: Secondary | ICD-10-CM

## 2022-02-08 MED ORDER — HYDROXYZINE HCL 50 MG PO TABS: 50.0000 mg | ORAL_TABLET | Freq: Two times a day (BID) | ORAL | 1 refills | Status: DC

## 2022-02-08 MED ORDER — HYDRALAZINE HCL 50 MG PO TABS: 50.0000 mg | ORAL_TABLET | Freq: Two times a day (BID) | ORAL | 1 refills | Status: DC

## 2022-02-08 MED ORDER — NITROGLYCERIN 0.4 MG SL SUBL: 0.4000 mg | SUBLINGUAL_TABLET | SUBLINGUAL | 3 refills | Status: DC | PRN

## 2022-02-08 MED ORDER — NITROGLYCERIN 0.4 MG SL SUBL: 0.4000 mg | SUBLINGUAL_TABLET | SUBLINGUAL | 1 refills | Status: AC | PRN

## 2022-02-08 NOTE — Progress Notes (Signed)
Patient referred by Biagio Borg, MD for hypertension  Subjective:   Casey Brennan, male    DOB: [birth year], 61 y.o.   MRN: [id number]   Chief Complaint  Patient presents with   Chest Pain   Follow-up    61 y.o. male with hypertension, hyperlipidemia, HIV, chest pain  Patient made urgent appt today due to chest pain. Patient has had two episodes of retrosternal pain, radiating to left arm, lasting for 6-7 min, over the last two days. He denies associated shortness of breath or any other symptoms. Pain improved with est on one of the two occasions.   On a separate note, blood pressure is elevated today. He continues to have intermittent headaches.   Initial consultation HPI 05/2020: Patient was recently seen by [facility] Dr. Eleonore Chiquito.  Prior to that, he was also seen by Drs. Harwani and [place].  His prior cardiac work-up showed coronary CTA with normal coronary arteries and calcium score 0.  He has not tolerated several antihypertensive medications in the past, including amlodipine, nifedipine, HCTZ, losartan, carvedilol, labetalol. He is tolerating metoprolol succinate.  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 He uses Aspirin 325 mg on M,W,F during his trips.     Current Outpatient Medications:    albuterol (PROVENTIL) (2.5 MG/3ML) 0.083% nebulizer solution, Take 3 mLs (2.5 mg total) by nebulization every 6 (six) hours as needed for wheezing or shortness of breath., Disp: 75 mL, Rfl: 3   albuterol (VENTOLIN HFA) 108 (90 Base) MCG/ACT inhaler, Inhale 2 puffs into the lungs every 6 (six) hours as needed for wheezing or shortness of breath., Disp: 36 g, Rfl: 3   ALPRAZolam (XANAX) 0.5 MG tablet, TAKE 1 TABLET(0.5 MG) BY MOUTH TWICE DAILY AS NEEDED FOR SLEEP, Disp: 60 tablet, Rfl: 1   Beclomethasone Dipropionate (QNASL) 80 MCG/ACT AERS, Place 2 sprays into the nose daily., Disp: 10.6 g, Rfl: 2   bictegravir-emtricitabine-tenofovir AF  (BIKTARVY) 50-200-25 MG TABS tablet, Take 1 tablet by mouth daily., Disp: 30 tablet, Rfl: 11   Budeson-Glycopyrrol-Formoterol (BREZTRI AEROSPHERE) 160-9-4.8 MCG/ACT AERO, INHALE 2 PUFFS INTO THE LUNGS IN THE MORNING AND AT BEDTIME, Disp: 10.7 g, Rfl: 0   cetirizine (ZYRTEC) 10 MG tablet, Take 1 tablet (10 mg total) by mouth daily., Disp: 90 tablet, Rfl: 3   Cholecalciferol (VITAMIN D3) 250 MCG (10000 UT) capsule, Take 1 tablet by mouth daily. [employer], Disp: , Rfl:    cyanocobalamin (,VITAMIN B-12,) 1000 MCG/ML injection, Inject 1 mL (1,000 mcg total) into the muscle every 30 (thirty) days., Disp: 3 mL, Rfl: 3   diphenhydrAMINE (BENADRYL) 25 MG tablet, Take 1 tablet (25 mg total) by mouth every 6 (six) hours as needed. (Patient taking differently: Take 25 mg by mouth every 6 (six) hours as needed for sleep, itching or allergies.), Disp: 30 tablet, Rfl: 0   doxycycline (VIBRA-TABS) 100 MG tablet, Take 1 tablet (100 mg total) by mouth 2 (two) times daily., Disp: 20 tablet, Rfl: 0   EPINEPHrine 0.3 mg/0.3 mL IJ SOAJ injection, Inject 0.3 mg into the muscle as needed for anaphylaxis., Disp: 1 each, Rfl: 5   Glucos-Chond-Hyal Ac-Ca Fructo (MOVE FREE JOINT HEALTH ADVANCE PO), Take 1 tablet by mouth daily., Disp: , Rfl:    hydrALAZINE (APRESOLINE) 25 MG tablet, Take 1 tablet (25 mg total) by mouth 3 (three) times daily., Disp: 270 tablet, Rfl: 3   hydrOXYzine (ATARAX) 25 MG tablet, TAKE 1  TABLET(25 MG) BY MOUTH AT BEDTIME, Disp: 90 tablet, Rfl: 3   meclizine (ANTIVERT) 25 MG tablet, Take 1 tablet (25 mg total) by mouth daily as needed., Disp: 40 tablet, Rfl: 5   methocarbamol (ROBAXIN) 500 MG tablet, Take 500 mg by mouth 4 (four) times daily., Disp: , Rfl:    metoprolol succinate (TOPROL-XL) 100 MG 24 hr tablet, Take 1 tablet (100 mg total) by mouth daily. Take with or immediately following a meal., Disp: 90 tablet, Rfl: 3   montelukast (SINGULAIR) 10 MG tablet, Take 10 mg by mouth daily., Disp: , Rfl:     Multiple Vitamin (MULTIVITAMIN WITH MINERALS) TABS tablet, Take 1 tablet by mouth daily after breakfast. , Disp: , Rfl:    mupirocin ointment (BACTROBAN) 2 %, APPLY EXTERNALLY TO THE AFFECTED AREA TWICE DAILY FOR 3 WEEKS, PLACE COTTON BETWEEN TOES TO ALLOW BETTER AERATION AS NEEDED (Patient taking differently: Apply 1 application. topically See admin instructions. APPLY EXTERNALLY TO THE AFFECTED AREA TWICE DAILY FOR 3 WEEKS, PLACE COTTON BETWEEN TOES TO ALLOW BETTER AERATION AS NEEDED), Disp: 66 g, Rfl: 1   omeprazole (PRILOSEC) 40 MG capsule, Take 1 capsule (40 mg total) by mouth 2 (two) times daily., Disp: 180 capsule, Rfl: 2   ondansetron (ZOFRAN-ODT) 4 MG disintegrating tablet, Take 1 tablet (4 mg total) by mouth every 8 (eight) hours as needed for nausea or vomiting., Disp: 30 tablet, Rfl: 2   OVER THE COUNTER MEDICATION, Take 2 capsules by mouth daily. Zhou: thyroid supplement, Disp: , Rfl:    rosuvastatin (CRESTOR) 20 MG tablet, Take 1 tablet (20 mg total) by mouth daily., Disp: 90 tablet, Rfl: 3   tizanidine (ZANAFLEX) 6 MG capsule, TAKE 1 CAPSULE(6 MG) BY MOUTH THREE TIMES DAILY, Disp: 90 capsule, Rfl: 11   torsemide (DEMADEX) 20 MG tablet, Take 10 mg by mouth See admin instructions. Tuesday Thursday Saturday, Disp: , Rfl:    Ubrogepant (UBRELVY) 100 MG TABS, Take 100 mg by mouth 2 (two) times daily as needed (can take a seecond dose 2 hiurs after first do not exceed 2 a day.)., Disp: 10 tablet, Rfl: 5  Cardiovascular and other pertinent studies:  EKG 02/08/2022: Sinus rhythm 70 bpm Occasional PAC     Echocardiogram 05/06/2020:  Moderately depressed LV systolic function with EF 37%. Left ventricle  cavity is normal in size. Moderate concentric hypertrophy of the left  ventricle. Hypokinetic global wall motion. Doppler evidence of grade I  (impaired) diastolic dysfunction, normal LAP. Calculated EF 37%.  Structurally normal mitral valve.  Mild (Grade I) mitral regurgitation.  Mild  pulmonic regurgitation.   CCTA 04/07/2020: 1. Coronary calcium score of 0. 2. Normal coronary origin with right dominance. 3. Normal coronary arteries. RECOMMENDATIONS: 1. No evidence of CAD (0%). Consider non-atherosclerotic causes of chest pain.   Recent labs: 01/25/2022: Glucose 88, BUN/Cr 8/1.14. EGFR 75. Na/K 143/4.9. AlKP 123. Rest of the CMP normal H/H 13/40. MCV 92. Platelets 167 HbA1C <4% Chol 155, TG 98, HDL 46, LDL 89 TSH 1.4 normal     Review of Systems  Constitutional: Negative for malaise/fatigue.  Cardiovascular:  Positive for chest pain. Negative for dyspnea on exertion, leg swelling, palpitations and syncope.  Neurological:  Positive for dizziness (room spinning sensation).         Vitals:   02/08/22 0949 02/08/22 0955  BP: (!) 152/91 (!) 144/91  Pulse: 76 63  Resp: 16   Temp: 98 F (36.7 C)   SpO2: 94%     Body  mass index is 30.56 kg/m. Filed Weights   02/08/22 0949  Weight: 213 lb (96.6 kg)     Objective:   Physical Exam Vitals and nursing note reviewed.  Constitutional:      General: He is not in acute distress.    Comments: Appears older than stated age  HENT:     Head: Normocephalic and atraumatic.  Neck:     Vascular: No JVD.  Cardiovascular:     Rate and Rhythm: Normal rate and regular rhythm.     Pulses: Intact distal pulses.     Heart sounds: Normal heart sounds, S1 normal and S2 normal. No murmur heard.    No gallop.  Pulmonary:     Effort: Pulmonary effort is normal. No respiratory distress.     Breath sounds: Normal breath sounds. No wheezing, rhonchi or rales.  Musculoskeletal:     Right lower leg: No edema.     Left lower leg: No edema.  Neurological:     Mental Status: He is alert.       Assessment & Recommendations:   61 y.o. male with hypertension, hyperlipidemia, HIV, chest pain  Chest pain: Concerning for angina, but no ischemic changes/infarct on the EKG today. He has had completely normal CCTA in  04/2020, making obstructive CAD highly unlikely. I wonder if he may have had coronary spasm. Will check echocardiogram and exercise treadmill stress test.   Hypertension: Uncontrolled. Intolerant to several medications, including amlodipine, nifedipine, HCTZ, losartan, carvedilol, labetalol.  Currently on metoprolol succinate 100 mg daily. He has taken hydralazine prn in the past. Make it 50 mg bid scheduled.   Hypertensive cardiomyopathy: EF around 40% (05/2020) without clinical evidence of heart failure. Check echocardiogram  F/u after tests   Nigel Mormon, PA-C 02/08/2022, 9:48 AM Office: [phone]

## 2022-02-08 NOTE — Addendum Note (Signed)
Addended by: Nigel Mormon on: 02/08/2022 11:38 AM   Modules accepted: Orders

## 2022-02-09 LAB — SPECIMEN STATUS REPORT

## 2022-02-09 LAB — GENOSURE PRIME (GSPRIL)

## 2022-02-10 ENCOUNTER — Other Ambulatory Visit: Payer: Self-pay

## 2022-02-10 ENCOUNTER — Telehealth: Payer: Self-pay | Admitting: Internal Medicine

## 2022-02-10 ENCOUNTER — Telehealth: Payer: Self-pay | Admitting: Neurology

## 2022-02-10 ENCOUNTER — Telehealth: Payer: Self-pay

## 2022-02-10 ENCOUNTER — Other Ambulatory Visit (HOSPITAL_COMMUNITY)
Admission: RE | Admit: 2022-02-10 | Discharge: 2022-02-10 | Disposition: A | Discharge status: Home / Self Care | Payer: Medicare Other | Attending: Cardiology | Admitting: Cardiology

## 2022-02-10 DIAGNOSIS — R072 Precordial pain: Secondary | ICD-10-CM

## 2022-02-10 DIAGNOSIS — I2510 Atherosclerotic heart disease of native coronary artery without angina pectoris: Secondary | ICD-10-CM

## 2022-02-10 DIAGNOSIS — M4692 Unspecified inflammatory spondylopathy, cervical region: Secondary | ICD-10-CM | POA: Diagnosis not present

## 2022-02-10 DIAGNOSIS — G4459 Other complicated headache syndrome: Secondary | ICD-10-CM

## 2022-02-10 DIAGNOSIS — M5412 Radiculopathy, cervical region: Secondary | ICD-10-CM | POA: Diagnosis not present

## 2022-02-10 LAB — TROPONIN I (HIGH SENSITIVITY): Troponin I (High Sensitivity): 3 ng/L (ref <18)

## 2022-02-10 NOTE — Telephone Encounter (Signed)
Patient requesting pain medication for neck due to surgeon being out of the office for two weeks.

## 2022-02-10 NOTE — Telephone Encounter (Signed)
Pt sis ter called to inform us that he had some left chest pain this morning and the pain went to his left arm and jaw. Pt sis ter mention pt Bp: 140/83 pulse 84. Pt sister mention that she gave him a nitro about 10 minutes ago. And it looks like it pain is slowly going away.

## 2022-02-10 NOTE — Telephone Encounter (Signed)
I am unable since we dont know what pain medication he refers to, and it is not normally a good idea for more than one provider to be prescribing narcotic pain medication;  perhaps a NS covering for the pt NS might be able to do this

## 2022-02-10 NOTE — Telephone Encounter (Signed)
Pt's sister called stating that the pt has been having cold feet for a week and they seem to get colder a he is not able to warm them up. His sister April on [place] would like to be called back to discuss.

## 2022-02-10 NOTE — Telephone Encounter (Signed)
Needs ROV 

## 2022-02-10 NOTE — Addendum Note (Signed)
Addended by: Nigel Mormon on: 02/10/2022 11:54 AM   Modules accepted: Orders

## 2022-02-10 NOTE — Telephone Encounter (Signed)
Trop negative. Keep stress test.  Thanks MJP

## 2022-02-10 NOTE — Telephone Encounter (Signed)
Pt has been informed.

## 2022-02-10 NOTE — Telephone Encounter (Signed)
Patient scheduled for 02/20/22

## 2022-02-10 NOTE — Telephone Encounter (Signed)
Patient's sister called to request that Dr. Jenny Reichmann order MRI of brain for patient - states that the doctor at [facility] would like this performed but cannot order it and wanted to see if PCP would place order.  Sister (April) also requests call back regarding medication for pain. Her call back number is [phone].

## 2022-02-13 NOTE — Telephone Encounter (Signed)
Can you put in new orders for the MRIs needed and write Raliegh Ip in the comments please

## 2022-02-13 NOTE — Telephone Encounter (Signed)
Called the pt sister back. She states that he has been having ongoing side effects since post neck surgery. He has been having concerns with dizziness and headaches and left jaw pain and left arm pain. All which have been addressed with the surgeon. They are recommending a MRI cervical neck and suggested MRI of brain but they were unable to order that. They advised her to talk with our office. There is a active MRI brain that was ordered 06/2021. She is asking if possible can that order be sent for him to have that completed the same time he has MRI neck done. I advised I do not know how that process works but that I would pass along to our MRI coordinator and see if she can help facilitate this. He is getting that done on the mobile MRI at [facility] and their phone number is [phone]. He is scheduled for cervical mri on 8/22.   In regards to the cold feet, I recommended following with PCP to rule out circulation issues. Pt has had a thorough cardiac work up completed when he was have the left arm and jaw pain and all was negative. Sister was appreciative

## 2022-02-13 NOTE — Addendum Note (Signed)
Addended by: Darleen Crocker on: 02/13/2022 01:23 PM   Modules accepted: Orders

## 2022-02-15 ENCOUNTER — Telehealth: Payer: Self-pay | Admitting: Neurology

## 2022-02-15 NOTE — Telephone Encounter (Signed)
[employer] medicare/medicaid no auth req faxed to [employer] they will reach out to the pt. to schedule. Ph # [phone] faxx # [phone].

## 2022-02-16 ENCOUNTER — Ambulatory Visit: Payer: Medicare Other | Admitting: Cardiology

## 2022-02-20 ENCOUNTER — Ambulatory Visit: Payer: PRIVATE HEALTH INSURANCE | Admitting: Internal Medicine

## 2022-02-24 ENCOUNTER — Ambulatory Visit (INDEPENDENT_AMBULATORY_CARE_PROVIDER_SITE_OTHER): Payer: PRIVATE HEALTH INSURANCE | Admitting: Internal Medicine

## 2022-02-24 ENCOUNTER — Encounter: Payer: Self-pay | Admitting: Internal Medicine

## 2022-02-24 VITALS — BP 130/86 | HR 84 | Temp 98.8°F | Ht 70.0 in | Wt 211.0 lb

## 2022-02-24 DIAGNOSIS — I10 Essential (primary) hypertension: Secondary | ICD-10-CM | POA: Diagnosis not present

## 2022-02-24 DIAGNOSIS — G43109 Migraine with aura, not intractable, without status migrainosus: Secondary | ICD-10-CM

## 2022-02-24 DIAGNOSIS — M4722 Other spondylosis with radiculopathy, cervical region: Secondary | ICD-10-CM | POA: Diagnosis not present

## 2022-02-24 DIAGNOSIS — E559 Vitamin D deficiency, unspecified: Secondary | ICD-10-CM | POA: Diagnosis not present

## 2022-02-24 MED ORDER — METHYLPREDNISOLONE SODIUM SUCC 125 MG IJ SOLR
80.0000 mg | Freq: Once | INTRAMUSCULAR | Status: AC
  Administered 2022-02-24: 80 mg via INTRAMUSCULAR

## 2022-02-24 MED ORDER — KETOROLAC TROMETHAMINE 60 MG/2ML IM SOLN
30.0000 mg | Freq: Once | INTRAMUSCULAR | Status: AC
  Administered 2022-02-24: 30 mg via INTRAMUSCULAR

## 2022-02-24 NOTE — Progress Notes (Unsigned)
Patient ID: [name], male   DOB: 1960-08-13, 61 y.o.   MRN: [id number]        Chief Complaint: follow up migraine HA       HPI:  Casey Brennan is a 61 y.o. male here with c/o acute onset left sided throbbing pain, severe, dull and sharp at times, with some radiation to the left post neck at times, without vision change, n/v.  No worsening sinus pain or fever.  Pt denies chest pain, increased sob or doe, wheezing, orthopnea, PND, increased LE swelling, palpitations, dizziness or syncope.   Pt denies polydipsia, polyuria, or new focal neuro s/s.    Pt denies fever, wt loss, night sweats, loss of appetite, or other constitutional symptoms  Pt has MRI ordered already per Neurology.           Wt Readings from Last 3 Encounters:  02/25/22 210 lb 15.7 oz (95.7 kg)  02/24/22 211 lb (95.7 kg)  02/08/22 213 lb (96.6 kg)   BP Readings from Last 3 Encounters:  02/26/22 (!) 144/98  02/24/22 130/86  02/08/22 (!) 144/91         Past Medical History:  Diagnosis Date   Asthma    very rare   Chronic anemia    normocytic   Chronic folliculitis    Environmental and seasonal allergies    H/O pericarditis    2010--  myopercarditis--  resolved   Headache(784.0)    HX SEVERE FRONTAL HA'S   Hiatal hernia    History of gastric ulcer    History of kidney stones    History of MRSA infection 2010   infected boil   HIV (human immunodeficiency virus infection) ([place]) 1988   Hypertension    Lytic bone lesion of hip    WORK-UP DONE BY ONCOLOGIST DR HA --  NOT MALIGNANT   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   Pneumonia    hx of   Post concussion syndrome    resolved   Prostate cancer ([place]) 04/25/2013   gleason 3+3=6, volume 30 gm   Ulcer    hx of gastric   Past Surgical History:  Procedure Laterality Date   ACDCF  09/29/2020   ANTERIOR CERVICAL DECOMPRESSION/DISCECTOMY FUSION 4 LEVELS N/A 09/29/2020   Procedure: ANTERIOR CERVICAL DECOMPRESSION FUSION CERVICAL 3-CERVICAL 4 , CERVICAL 4 -  CERVICAL 5, CERVICAL 5 - CERVICAL 6 WITH INSTRUMENTATION AND ALLOGRAFT;  Surgeon: Phylliss Bob, MD;  Location: [facility];  Service: Orthopedics;  Laterality: N/A;   CARDIAC CATHETERIZATION  03-23-2009  DR COOPER   NORMAL CORONARY ARTERIES   CHOLECYSTECTOMY N/A 11/06/2014   Procedure: LAPAROSCOPIC CHOLECYSTECTOMY WITH INTRAOPERATIVE CHOLANGIOGRAM;  Surgeon: Judeth Horn, MD;  Location: [facility];  Service: General;  Laterality: N/A;   COLONOSCOPY  12/26/2011   Procedure: COLONOSCOPY;  Surgeon: Lear Ng, MD;  Location: [facility];  Service: Endoscopy;  Laterality: N/A;   COLONOSCOPY     COLONOSCOPY WITH PROPOFOL N/A 07/29/2014   Procedure: COLONOSCOPY WITH PROPOFOL;  Surgeon: Lear Ng, MD;  Location: [facility];  Service: Endoscopy;  Laterality: N/A;   CYSTOSCOPY N/A 05/18/2018   Procedure: CYSTOSCOPY REMOVAL FOREIGN BODY;  Surgeon: Kathie Rhodes, MD;  Location: [facility];  Service: Urology;  Laterality: N/A;   CYSTOSCOPY/RETROGRADE/URETEROSCOPY Bilateral 04/25/2013   Procedure: CYSTOSCOPY/ BILATERAL RETROGRADES; BLADDER BIOPSIES;  Surgeon: Alexis Frock, MD;  Location: [facility];  Service: Urology;  Laterality: Bilateral;   DENTAL EXAMINATION UNDER ANESTHESIA     ESOPHAGOGASTRODUODENOSCOPY  12/26/2011   Procedure: ESOPHAGOGASTRODUODENOSCOPY (EGD);  Surgeon: Lear Ng, MD;  Location: [facility];  Service: Endoscopy;  Laterality: N/A;   ESOPHAGOGASTRODUODENOSCOPY (EGD) WITH PROPOFOL N/A 07/29/2014   Procedure: ESOPHAGOGASTRODUODENOSCOPY (EGD) WITH PROPOFOL;  Surgeon: Lear Ng, MD;  Location: [facility];  Service: Endoscopy;  Laterality: N/A;   EXCISION CHRONIC LEFT BREAST ABSCESS  09-21-2010   EXCISIONAL BX LEFT BREAST MASS/  I  &  D LEFT BREAST ABSCESS  03-24-2009   KNEE ARTHROSCOPY Right 1985   left axilla biopsy  04/2013   left hip biopsy  10/2012   LYMPHADENECTOMY Bilateral 08/18/2013   Procedure: LYMPHADENECTOMY "PELVIC LYMPH NODE  DISSECTION";  Surgeon: Alexis Frock, MD;  Location: [facility];  Service: Urology;  Laterality: Bilateral;   PROSTATE BIOPSY N/A 04/25/2013   Procedure: BIOPSY TRANSRECTAL ULTRASONIC PROSTATE (TUBP);  Surgeon: Alexis Frock, MD;  [facility];  Service: Urology;  Laterality: N/A;   ROBOT ASSISTED LAPAROSCOPIC RADICAL PROSTATECTOMY N/A 08/18/2013   Procedure: ROBOTIC ASSISTED LAPAROSCOPIC RADICAL PROSTATECTOMY;  Surgeon: Alexis Frock, MD;  Location: [facility];  Service: Urology;  Laterality: N/A;   TOTAL KNEE ARTHROPLASTY Right 08/01/2016   08/17/16   TOTAL KNEE ARTHROPLASTY Right 08/01/2016   Procedure: TOTAL KNEE ARTHROPLASTY;  Surgeon: Renette Butters, MD;  Location: [facility];  Service: Orthopedics;  Laterality: Right;   UPPER GASTROINTESTINAL ENDOSCOPY     UPPER LEG SOFT TISSUE BIOPSY Left 2012   lthigh    reports that he quit smoking about 11 years ago. His smoking use included cigars and cigarettes. He has a 34.50 pack-year smoking history. He has never used smokeless tobacco. He reports that he does not currently use alcohol. He reports that he does not use drugs. family history includes Asthma in his father and sister; Cancer in his father and maternal uncle; Diabetes in his father; Hypertension in his sister; Stroke in his father. Allergies  Allergen Reactions   Amoxapine Shortness Of Breath   Chocolate Shortness Of Breath and Other (See Comments)    Asthma attack, welts   Ciprofloxacin Anaphylaxis, Dermatitis, Itching, Nausea And Vomiting, Shortness Of Breath, Swelling and Hives   Descovy [Emtricitabine-Tenofovir Af] Shortness Of Breath    Nasuea, vomiiting, diarrhea, sob, whelps, throat closes   Genvoya [Elviteg-Cobic-Emtricit-Tenofaf] Shortness Of Breath    Nausea, vomitting, diarreha, sob, rash, throat closing   Morphine And Related Shortness Of Breath, Itching and Other (See Comments)     chest pain   Oxycodone-Acetaminophen Shortness Of Breath   Penicillins  Shortness Of Breath and Rash    Has patient had a PCN reaction causing immediate rash, facial/tongue/throat swelling, SOB or lightheadedness with hypotension: Yes Has patient had a PCN reaction causing severe rash involving mucus membranes or skin necrosis: No Has patient had a PCN reaction that required hospitalization No Has patient had a PCN reaction occurring within the last 10 years: Yes If all of the above answers are "NO", then may proceed with Cephalosporin use.   Sulfa Antibiotics Anaphylaxis and Other (See Comments)   Tomato Shortness Of Breath   Vancomycin Shortness Of Breath and Other (See Comments)   Darunavir Itching   Amlodipine Other (See Comments)    swelling   Carvedilol     LEG SWELLING, DIZZNESS   Flagyl [Metronidazole] Itching   Milk-Related Compounds    Morphine Other (See Comments)   Penicillin G Other (See Comments)   Toradol [Ketorolac Tromethamine] Nausea And Vomiting    itching   Acetaminophen Rash and Other (  See Comments)    Pt tolerates percocet and also plain APAP without rash   Tramadol Itching, Nausea And Vomiting and Other (See Comments)   Current Outpatient Medications on File Prior to Visit  Medication Sig Dispense Refill   albuterol (PROVENTIL) (2.5 MG/3ML) 0.083% nebulizer solution Take 3 mLs (2.5 mg total) by nebulization every 6 (six) hours as needed for wheezing or shortness of breath. 75 mL 3   albuterol (VENTOLIN HFA) 108 (90 Base) MCG/ACT inhaler Inhale 2 puffs into the lungs every 6 (six) hours as needed for wheezing or shortness of breath. 36 g 3   ALPRAZolam (XANAX) 0.5 MG tablet TAKE 1 TABLET(0.5 MG) BY MOUTH TWICE DAILY AS NEEDED FOR SLEEP 60 tablet 1   Beclomethasone Dipropionate (QNASL) 80 MCG/ACT AERS Place 2 sprays into the nose daily. 10.6 g 2   bictegravir-emtricitabine-tenofovir AF (BIKTARVY) 50-200-25 MG TABS tablet Take 1 tablet by mouth daily. 30 tablet 11   Budeson-Glycopyrrol-Formoterol (BREZTRI AEROSPHERE) 160-9-4.8 MCG/ACT  AERO INHALE 2 PUFFS INTO THE LUNGS IN THE MORNING AND AT BEDTIME 10.7 g 0   cetirizine (ZYRTEC) 10 MG tablet Take 1 tablet (10 mg total) by mouth daily. 90 tablet 3   Cholecalciferol (VITAMIN D3) 250 MCG (10000 UT) capsule Take 1 tablet by mouth daily. [employer]     cyanocobalamin (,VITAMIN B-12,) 1000 MCG/ML injection Inject 1 mL (1,000 mcg total) into the muscle every 30 (thirty) days. 3 mL 3   diphenhydrAMINE (BENADRYL) 25 MG tablet Take 1 tablet (25 mg total) by mouth every 6 (six) hours as needed. (Patient taking differently: Take 25 mg by mouth every 6 (six) hours as needed for sleep, itching or allergies.) 30 tablet 0   EPINEPHrine 0.3 mg/0.3 mL IJ SOAJ injection Inject 0.3 mg into the muscle as needed for anaphylaxis. 1 each 5   gabapentin (NEURONTIN) 300 MG capsule Take by mouth 2 (two) times daily.     Glucos-Chond-Hyal Ac-Ca Fructo (MOVE FREE JOINT HEALTH ADVANCE PO) Take 1 tablet by mouth daily.     hydrALAZINE (APRESOLINE) 50 MG tablet Take 50 mg by mouth 2 (two) times daily.     hydrOXYzine (ATARAX) 50 MG tablet Take 1 tablet (50 mg total) by mouth 2 (two) times daily. 60 tablet 1   meclizine (ANTIVERT) 25 MG tablet Take 1 tablet (25 mg total) by mouth daily as needed. 40 tablet 5   methocarbamol (ROBAXIN) 500 MG tablet Take 500 mg by mouth 4 (four) times daily.     metoprolol succinate (TOPROL-XL) 100 MG 24 hr tablet Take 1 tablet (100 mg total) by mouth daily. Take with or immediately following a meal. 90 tablet 3   montelukast (SINGULAIR) 10 MG tablet Take 10 mg by mouth daily.     Multiple Vitamin (MULTIVITAMIN WITH MINERALS) TABS tablet Take 1 tablet by mouth daily after breakfast.      mupirocin ointment (BACTROBAN) 2 % APPLY EXTERNALLY TO THE AFFECTED AREA TWICE DAILY FOR 3 WEEKS, PLACE COTTON BETWEEN TOES TO ALLOW BETTER AERATION AS NEEDED (Patient taking differently: Apply 1 application  topically See admin instructions. APPLY EXTERNALLY TO THE AFFECTED AREA TWICE DAILY FOR  3 WEEKS, PLACE COTTON BETWEEN TOES TO ALLOW BETTER AERATION AS NEEDED) 66 g 1   nitroGLYCERIN (NITROSTAT) 0.4 MG SL tablet Place 1 tablet (0.4 mg total) under the tongue every 5 (five) minutes as needed for chest pain. 30 tablet 1   omeprazole (PRILOSEC) 40 MG capsule Take 1 capsule (40 mg total) by mouth 2 (  two) times daily. 180 capsule 2   ondansetron (ZOFRAN-ODT) 4 MG disintegrating tablet Take 1 tablet (4 mg total) by mouth every 8 (eight) hours as needed for nausea or vomiting. 30 tablet 2   OVER THE COUNTER MEDICATION Take 2 capsules by mouth daily. Zhou: thyroid supplement     rosuvastatin (CRESTOR) 20 MG tablet Take 1 tablet (20 mg total) by mouth daily. 90 tablet 3   tizanidine (ZANAFLEX) 6 MG capsule TAKE 1 CAPSULE(6 MG) BY MOUTH THREE TIMES DAILY 90 capsule 11   torsemide (DEMADEX) 20 MG tablet Take 10 mg by mouth See admin instructions. Tuesday Thursday Saturday     Ubrogepant (UBRELVY) 100 MG TABS Take 100 mg by mouth 2 (two) times daily as needed (can take a seecond dose 2 hiurs after first do not exceed 2 a day.). 10 tablet 5   [DISCONTINUED] zolpidem (AMBIEN) 5 MG tablet Take 1-2 tablets (5-10 mg total) by mouth at bedtime as needed for sleep. (Patient not taking: Reported on 04/23/2019) 30 tablet 2   No current facility-administered medications on file prior to visit.        ROS:  All others reviewed and negative.  Objective        PE:  BP 130/86 (BP Location: Right Arm, Patient Position: Sitting, Cuff Size: Normal)   Pulse 84   Temp 98.8 F (37.1 C) (Oral)   Ht '5\' 10"'$  (1.778 m)   Wt 211 lb (95.7 kg)   SpO2 94%   BMI 30.28 kg/m                 Constitutional: Pt appears in NAD               HENT: Head: NCAT.                Right Ear: External ear normal.                 Left Ear: External ear normal.                Eyes: . Pupils are equal, round, and reactive to light. Conjunctivae and EOM are normal               Nose: without d/c or deformity               Neck:  Neck supple. Gross normal ROM               Cardiovascular: Normal rate and regular rhythm.                 Pulmonary/Chest: Effort normal and breath sounds without rales or wheezing.                Abd:  Soft, NT, ND, + BS, no organomegaly               Neurological: Pt is alert. At baseline orientation, motor grossly intact, cn 2-12 intact               Skin: Skin is warm. No rashes, no other new lesions, LE edema - none               Psychiatric: Pt behavior is normal without agitation   Micro: none  Cardiac tracings I have personally interpreted today:  none  Pertinent Radiological findings (summarize): none   Lab Results  Component Value Date   WBC 5.4 02/25/2022   HGB 12.1 (L) 02/25/2022   HCT 36.9 (L) 02/25/2022  PLT 184 02/25/2022   GLUCOSE 110 (H) 02/25/2022   CHOL 155 08/24/2021   TRIG 98.0 08/24/2021   HDL 46.40 08/24/2021   LDLCALC 89 08/24/2021   ALT 16 01/25/2022   AST 17 01/25/2022   NA 140 02/25/2022   K 4.1 02/25/2022   CL 110 02/25/2022   CREATININE 1.19 02/25/2022   BUN 10 02/25/2022   CO2 25 02/25/2022   TSH 1.48 08/24/2021   PSA 0.00 (L) 08/24/2021   INR 1.0 06/23/2021   HGBA1C <4.0 08/25/2021   MICROALBUR 16.88 (H) 10/05/2011   Assessment/Plan:  Casey Brennan is a 61 y.o. 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  Cathlean Cower, MD 02/28/2022 6:46 PM [facility]

## 2022-02-24 NOTE — Patient Instructions (Addendum)
You had the pain shot (toradol) and steroid shot today   Please continue all other medications as before, and refills have been done if requested.  Please have the pharmacy call with any other refills you may need.  Please keep your appointments with your specialists as you may have planned - the MRI ordered per Neurology  Please make an Appointment to return in May 18, 2022, or sooner if needed

## 2022-02-25 ENCOUNTER — Other Ambulatory Visit: Payer: Self-pay

## 2022-02-25 ENCOUNTER — Emergency Department (HOSPITAL_COMMUNITY): Payer: PRIVATE HEALTH INSURANCE

## 2022-02-25 ENCOUNTER — Emergency Department (HOSPITAL_COMMUNITY)
Admission: EM | Admit: 2022-02-25 | Discharge: 2022-02-26 | Disposition: A | Discharge status: Home / Self Care | Payer: PRIVATE HEALTH INSURANCE | Attending: Emergency Medicine | Admitting: Emergency Medicine

## 2022-02-25 DIAGNOSIS — R072 Precordial pain: Secondary | ICD-10-CM | POA: Diagnosis not present

## 2022-02-25 DIAGNOSIS — R0602 Shortness of breath: Secondary | ICD-10-CM | POA: Diagnosis not present

## 2022-02-25 DIAGNOSIS — Z743 Need for continuous supervision: Secondary | ICD-10-CM | POA: Diagnosis not present

## 2022-02-25 DIAGNOSIS — I10 Essential (primary) hypertension: Secondary | ICD-10-CM | POA: Insufficient documentation

## 2022-02-25 DIAGNOSIS — M79602 Pain in left arm: Secondary | ICD-10-CM | POA: Diagnosis not present

## 2022-02-25 DIAGNOSIS — R6889 Other general symptoms and signs: Secondary | ICD-10-CM | POA: Diagnosis not present

## 2022-02-25 DIAGNOSIS — Z79899 Other long term (current) drug therapy: Secondary | ICD-10-CM | POA: Diagnosis not present

## 2022-02-25 DIAGNOSIS — Z21 Asymptomatic human immunodeficiency virus [HIV] infection status: Secondary | ICD-10-CM | POA: Diagnosis not present

## 2022-02-25 DIAGNOSIS — R791 Abnormal coagulation profile: Secondary | ICD-10-CM | POA: Insufficient documentation

## 2022-02-25 DIAGNOSIS — Z7982 Long term (current) use of aspirin: Secondary | ICD-10-CM | POA: Insufficient documentation

## 2022-02-25 DIAGNOSIS — R0789 Other chest pain: Secondary | ICD-10-CM | POA: Diagnosis not present

## 2022-02-25 DIAGNOSIS — R079 Chest pain, unspecified: Secondary | ICD-10-CM | POA: Insufficient documentation

## 2022-02-25 LAB — CBC
HCT: 36.9 % — ABNORMAL LOW (ref 39.0–52.0)
Hemoglobin: 12.1 g/dL — ABNORMAL LOW (ref 13.0–17.0)
MCH: 31.3 pg (ref 26.0–34.0)
MCHC: 32.8 g/dL (ref 30.0–36.0)
MCV: 95.3 fL (ref 80.0–100.0)
Platelets: 184 10*3/uL (ref 150–400)
RBC: 3.87 MIL/uL — ABNORMAL LOW (ref 4.22–5.81)
RDW: 14.7 % (ref 11.5–15.5)
WBC: 5.4 10*3/uL (ref 4.0–10.5)
nRBC: 0 % (ref 0.0–0.2)

## 2022-02-25 LAB — D-DIMER, QUANTITATIVE: D-Dimer, Quant: 0.54 ug/mL-FEU — ABNORMAL HIGH (ref 0.00–0.50)

## 2022-02-25 LAB — BASIC METABOLIC PANEL
Anion gap: 5 (ref 5–15)
BUN: 10 mg/dL (ref 6–20)
CO2: 25 mmol/L (ref 22–32)
Calcium: 8.6 mg/dL — ABNORMAL LOW (ref 8.9–10.3)
Chloride: 110 mmol/L (ref 98–111)
Creatinine, Ser: 1.19 mg/dL (ref 0.61–1.24)
GFR, Estimated: >60 mL/min (ref >60)
Glucose, Bld: 110 mg/dL — ABNORMAL HIGH (ref 70–99)
Potassium: 4.1 mmol/L (ref 3.5–5.1)
Sodium: 140 mmol/L (ref 135–145)

## 2022-02-25 LAB — TROPONIN I (HIGH SENSITIVITY)
Troponin I (High Sensitivity): 3 ng/L (ref <18)
Troponin I (High Sensitivity): 2 ng/L (ref <18)

## 2022-02-25 MED ORDER — HYDROMORPHONE HCL 2 MG/ML IJ SOLN
1.0000 mg | Freq: Once | INTRAMUSCULAR | Status: AC
  Administered 2022-02-25: 1 mg via INTRAVENOUS
  Filled 2022-02-25: qty 1

## 2022-02-25 MED ORDER — HYDROMORPHONE HCL 2 MG/ML IJ SOLN
0.5000 mg | Freq: Once | INTRAMUSCULAR | Status: AC
  Administered 2022-02-25: 0.5 mg via INTRAVENOUS
  Filled 2022-02-25: qty 1

## 2022-02-25 NOTE — ED Provider Notes (Signed)
[facility] DEPT Provider Note   CSN: [id number] Arrival date & time: 02/25/22  2017     History {Add pertinent medical, surgical, social history, OB history to HPI:1} Chief Complaint  Patient presents with   Chest Pain    Casey Brennan is a 61 y.o. male.  Pt is a 60y/o male with hx of hypertension, hyperlipidemia, HIV who is presenting today with chest pain.  Patient reports today he was just sitting in his recliner when he developed central pain in his chest that went into his left arm and up into his jaw.  It did make him feel slightly short of breath and he feels that the pain is a little bit worse with taking a deep breath.  He recently saw Dr. Shanon Brow with cardiology due to a few other episodes of chest pain he has had in the last few months.  He reports that one of the pain episodes he had prior to this he did have with exertion and it resolved with rest but the second 1 was at rest.  Both of those episodes feels similar to this when excepting only lasted 6 to 7 minutes.  Unfortunately the episode today has been ongoing.  He did try taking 2 nitroglycerin at home and it only mildly lessen the pain.  He was given aspirin by EMS prior to arrival and reports his chest pain is still a 5 out of 10.  He has not had any recent cough congestion or fever.  He has no abdominal pain and denies symptoms starting shortly after eating.  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  He does take aspirin Monday Wednesdays and Fridays when he is on the road and has no prior history of PE or DVT.  He has not noticed recent exertional dyspnea and reports prior to the pain starting while he was sitting in his recliner he had not been having issues.  However alternately he has been having ongoing issues with headaches and neck pain.  He has had prior neck fusion with Dr. Lynann Bologna and has been following up with neuro and orthopedics to see  what can be done about these ongoing worsening headaches.  He does report a headache today before the pain started but it got worse after taking nitroglycerin.  He was recently started on hydralzine during his last visit with cardiology due to persistent hypertension.  He has had a normal cardiac CT in 2021 but they have found on prior echoes an EF of around 37 to 40% due to hypertrophic cardiomyopathy.  Dr. Shanon Brow did discuss following up with a repeat echo and stress test but patient has not had this done yet.  The history is provided by the patient and a relative.  Chest Pain      Home Medications Prior to Admission medications   Medication Sig Start Date End Date Taking? Authorizing Provider  albuterol (PROVENTIL) (2.5 MG/3ML) 0.083% nebulizer solution Take 3 mLs (2.5 mg total) by nebulization every 6 (six) hours as needed for wheezing or shortness of breath. 01/25/22   Campbell Riches, MD  albuterol (VENTOLIN HFA) 108 (90 Base) MCG/ACT inhaler Inhale 2 puffs into the lungs every 6 (six) hours as needed for wheezing or shortness of breath. 01/25/22   Campbell Riches, MD  ALPRAZolam Duanne Moron) 0.5 MG tablet TAKE 1 TABLET(0.5 MG) BY MOUTH TWICE DAILY AS NEEDED FOR SLEEP 12/27/21   Dohmeier, Asencion Partridge, MD  Beclomethasone  Dipropionate (QNASL) 80 MCG/ACT AERS Place 2 sprays into the nose daily. 01/04/21   Rigoberto Noel, MD  bictegravir-emtricitabine-tenofovir AF (BIKTARVY) 50-200-25 MG TABS tablet Take 1 tablet by mouth daily. 06/29/21   Thayer Headings, MD  brimonidine (ALPHAGAN) 0.2 % ophthalmic solution Place 1 drop into the right eye 2 (two) times daily. 02/17/22   [provider]  Budeson-Glycopyrrol-Formoterol (BREZTRI AEROSPHERE) 160-9-4.8 MCG/ACT AERO INHALE 2 PUFFS INTO THE LUNGS IN THE MORNING AND AT BEDTIME 10/04/21   Rigoberto Noel, MD  cetirizine (ZYRTEC) 10 MG tablet Take 1 tablet (10 mg total) by mouth daily. 11/11/21 11/11/22  Biagio Borg, MD  Cholecalciferol (VITAMIN D3) 250  MCG (10000 UT) capsule Take 1 tablet by mouth daily. [employer]'s [employer] 04/14/21   [provider]  cyanocobalamin (,VITAMIN B-12,) 1000 MCG/ML injection Inject 1 mL (1,000 mcg total) into the muscle every 30 (thirty) days. 11/12/20   Biagio Borg, MD  diphenhydrAMINE (BENADRYL) 25 MG tablet Take 1 tablet (25 mg total) by mouth every 6 (six) hours as needed. Patient taking differently: Take 25 mg by mouth every 6 (six) hours as needed for sleep, itching or allergies. 04/11/21   Alroy Bailiff, Margaux, PA-C  EPINEPHrine 0.3 mg/0.3 mL IJ SOAJ injection Inject 0.3 mg into the muscle as needed for anaphylaxis. 10/18/20   Biagio Borg, MD  gabapentin (NEURONTIN) 300 MG capsule Take by mouth 2 (two) times daily. 12/06/21   [provider]  Glucos-Chond-Hyal Ac-Ca Fructo (MOVE FREE JOINT HEALTH ADVANCE PO) Take 1 tablet by mouth daily.    [provider]  hydrALAZINE (APRESOLINE) 50 MG tablet Take 50 mg by mouth 2 (two) times daily. 02/08/22   [provider]  hydrOXYzine (ATARAX) 50 MG tablet Take 1 tablet (50 mg total) by mouth 2 (two) times daily. 02/08/22   Patwardhan, Reynold Bowen, MD  meclizine (ANTIVERT) 25 MG tablet Take 1 tablet (25 mg total) by mouth daily as needed. 11/11/21   Biagio Borg, MD  methocarbamol (ROBAXIN) 500 MG tablet Take 500 mg by mouth 4 (four) times daily.    [provider]  metoprolol succinate (TOPROL-XL) 100 MG 24 hr tablet Take 1 tablet (100 mg total) by mouth daily. Take with or immediately following a meal. 11/24/21   Biagio Borg, MD  montelukast (SINGULAIR) 10 MG tablet Take 10 mg by mouth daily. 11/15/20   [provider]  Multiple Vitamin (MULTIVITAMIN WITH MINERALS) TABS tablet Take 1 tablet by mouth daily after breakfast.     [provider]  mupirocin ointment (BACTROBAN) 2 % APPLY EXTERNALLY TO THE AFFECTED AREA TWICE DAILY FOR 3 WEEKS, PLACE COTTON BETWEEN TOES TO ALLOW BETTER AERATION AS NEEDED Patient taking differently:  Apply 1 application  topically See admin instructions. APPLY EXTERNALLY TO THE AFFECTED AREA TWICE DAILY FOR 3 WEEKS, PLACE COTTON BETWEEN TOES TO ALLOW BETTER AERATION AS NEEDED 04/20/21   Campbell Riches, MD  nitroGLYCERIN (NITROSTAT) 0.4 MG SL tablet Place 1 tablet (0.4 mg total) under the tongue every 5 (five) minutes as needed for chest pain. 02/08/22 05/09/22  Patwardhan, Reynold Bowen, MD  omeprazole (PRILOSEC) 40 MG capsule Take 1 capsule (40 mg total) by mouth 2 (two) times daily. 04/20/21   Biagio Borg, MD  ondansetron (ZOFRAN-ODT) 4 MG disintegrating tablet Take 1 tablet (4 mg total) by mouth every 8 (eight) hours as needed for nausea or vomiting. 06/29/21   Comer, Okey Regal, MD  OVER THE COUNTER MEDICATION Take  2 capsules by mouth daily. Zhou: thyroid supplement    [provider]  rosuvastatin (CRESTOR) 20 MG tablet Take 1 tablet (20 mg total) by mouth daily. 08/24/21   Biagio Borg, MD  tiZANidine (ZANAFLEX) 4 MG tablet Take 2-4 mg by mouth 3 (three) times daily as needed. 02/10/22   [provider]  tizanidine (ZANAFLEX) 6 MG capsule TAKE 1 CAPSULE(6 MG) BY MOUTH THREE TIMES DAILY 06/28/21   Dohmeier, Asencion Partridge, MD  torsemide (DEMADEX) 20 MG tablet Take 10 mg by mouth See admin instructions. Tuesday Thursday Saturday    [provider]  Ubrogepant (UBRELVY) 100 MG TABS Take 100 mg by mouth 2 (two) times daily as needed (can take a seecond dose 2 hiurs after first do not exceed 2 a day.). 10/31/21   Dohmeier, Asencion Partridge, MD  zolpidem (AMBIEN) 5 MG tablet Take 1-2 tablets (5-10 mg total) by mouth at bedtime as needed for sleep. Patient not taking: Reported on 04/23/2019 12/21/16 05/19/19  Mignon Pine, DO      Allergies    Amoxapine, Chocolate, Ciprofloxacin, Descovy [emtricitabine-tenofovir af], Genvoya [elviteg-cobic-emtricit-tenofaf], Morphine and related, Oxycodone-acetaminophen, Penicillins, Sulfa antibiotics, Tomato, Vancomycin, Darunavir, Amlodipine, Carvedilol,  Flagyl [metronidazole], Milk-related compounds, Morphine, Penicillin g, Toradol [ketorolac tromethamine], Acetaminophen, and Tramadol    Review of Systems   Review of Systems  Cardiovascular:  Positive for chest pain.    Physical Exam Updated Vital Signs BP (!) 139/94   Pulse (!) 57   Temp 98.2 F (36.8 C)   Resp 13   Ht '5\' 10"'$  (1.778 m)   Wt 95.7 kg   SpO2 95%   BMI 30.27 kg/m  Physical Exam Vitals and nursing note reviewed.  Constitutional:      General: He is not in acute distress.    Appearance: He is well-developed.  HENT:     Head: Normocephalic and atraumatic.  Eyes:     Conjunctiva/sclera: Conjunctivae normal.     Pupils: Pupils are equal, round, and reactive to light.  Cardiovascular:     Rate and Rhythm: Normal rate and regular rhythm.     Pulses: Normal pulses.     Heart sounds: No murmur heard. Pulmonary:     Effort: Pulmonary effort is normal. No respiratory distress.     Breath sounds: Normal breath sounds. No wheezing or rales.     Comments: Minimal tenderness with palpation to the sternum Chest:     Chest wall: Tenderness present.  Abdominal:     General: There is no distension.     Palpations: Abdomen is soft.     Tenderness: There is no abdominal tenderness. There is no guarding or rebound.  Musculoskeletal:        General: No tenderness. Normal range of motion.     Cervical back: Normal range of motion and neck supple.     Right lower leg: No edema.     Left lower leg: No edema.  Skin:    General: Skin is warm and dry.     Findings: No erythema or rash.  Neurological:     Mental Status: He is alert and oriented to person, place, and time.  Psychiatric:        Mood and Affect: Mood normal.        Behavior: Behavior normal.     ED Results / Procedures / Treatments   Labs (all labs ordered are listed, but only abnormal results are displayed) Labs Reviewed  BASIC METABOLIC PANEL - Abnormal; Notable for the  following components:       Result Value   Glucose, Bld 110 (*)    Calcium 8.6 (*)    All other components within normal limits  CBC - Abnormal; Notable for the following components:   RBC 3.87 (*)    Hemoglobin 12.1 (*)    HCT 36.9 (*)    All other components within normal limits  D-DIMER, QUANTITATIVE - Abnormal; Notable for the following components:   D-Dimer, Quant 0.54 (*)    All other components within normal limits  TROPONIN I (HIGH SENSITIVITY)  TROPONIN I (HIGH SENSITIVITY)    EKG EKG Interpretation  Date/Time:  Saturday February 25 2022 20:27:02 EDT Ventricular Rate:  73 PR Interval:  179 QRS Duration: 86 QT Interval:  382 QTC Calculation: 421 R Axis:   50 Text Interpretation: Sinus rhythm Atrial premature complexes Minimal ST elevation, anterior leads Confirmed by Blanchie Dessert [phone]) on 02/25/2022 9:03:35 PM  Radiology DG Chest 2 View  Result Date: 02/25/2022 CLINICAL DATA:  BIB [facility] with c/o midsternal chest pain radiating to jaw and L arm since 1 hour PTA. Hx of HTN, recently had meds changed. H/o prostate cancer as well. EXAM: CHEST - 2 VIEW COMPARISON:  06/26/2021 FINDINGS: Lungs are clear.  Prominent nipple shadows as before. Heart size and mediastinal contours are within normal limits. No effusion. Cholecystectomy clips. Cervical fixation hardware partially visualized. IMPRESSION: No acute cardiopulmonary disease. Electronically Signed   By: Lucrezia Europe M.D.   On: 02/25/2022 20:49    Procedures Procedures  {Document cardiac monitor, telemetry assessment procedure when appropriate:1}  Medications Ordered in ED Medications  HYDROmorphone (DILAUDID) injection 0.5 mg (0.5 mg Intravenous Given 02/25/22 2139)    ED Course/ Medical Decision Making/ A&P                           Medical Decision Making Amount and/or Complexity of Data Reviewed Labs: ordered. Radiology: ordered.  Risk Prescription drug management.   Pt with multiple medical problems and comorbidities and  presenting today with a complaint that caries a high risk for morbidity and mortality.  Here today with complaint of persistent chest pain.  Patient does have several risk factors for ACS.  He did have a cardiac CT and calcium score done in 2021 which was completely normal.  He has had echo in the same timeframe that did show a reduced EF of 37 to 40%.  He has recently followed up with cardiology who recommended repeat echo and stress test but he has not had that done.  He had his third episode of pain today while at rest.  He denies symptoms suggestive of pneumonia, pneumothorax or pericarditis.  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  Patient is not having any GI symptoms has no abdominal pain at this time and low suspicion for an acute GI cause of his symptoms.  Blood pressure has been improving after starting his new blood pressure medication 3 weeks ago and pulses are equal in all extremities with lower suspicion for dissection at this time.  I independently interpreted patient's EKG and labs today.  EKG does not show significant changes from prior.  D-dimer is 0.54 which age-adjusted is normal, BMP is within normal limits and CBC without acute changes.  Troponin initially is  negative at 3.  I have independently visualized and interpreted pt's images today.  Chest x-ray today is negative.  Given patient has already received aspirin and nitroglycerin for his pain and had only moderate resolution will give pain medication.  Patient has 21 allergies and has limited possibilities for medications.  He reports he has tolerated Dilaudid and fentanyl in the past.  He is allergic to morphine.  We will give a dose of pain medication to see if that improves his symptoms.  We will repeat a troponin.    {Document critical care time when  appropriate:1} {Document review of labs and clinical decision tools ie heart score, Chads2Vasc2 etc:1}  {Document your independent review of radiology images, and any outside records:1} {Document your discussion with family members, caretakers, and with consultants:1} {Document social determinants of health affecting pt's care:1} {Document your decision making why or why not admission, treatments were needed:1} Final Clinical Impression(s) / ED Diagnoses Final diagnoses:  None    Rx / DC Orders ED Discharge Orders     None

## 2022-02-25 NOTE — ED Triage Notes (Addendum)
BIB [facility] with c/o midsternal chest pain radiating to jaw and L arm since 1 hour PTA. Hx of HTN, recently had meds changed. Given 2 nitros and 324 ASA with EMS.

## 2022-02-25 NOTE — ED Provider Triage Note (Signed)
Emergency Medicine Provider Triage Evaluation Note  Casey Brennan , a 61 y.o. male  was evaluated in triage.  Pt complains of left-sided CP that started 2 hours ago that radiates to left jaw and arm. He notes it feels like someone is punching him in the chest associated with SOB. No previous MI. History of cancer. No history of blood clots. Patient's sister gave him 2 nitros with some improvement initially. He was given ASA 324 by EMS.  Review of Systems  Positive: CP, SOB Negative: fever  Physical Exam  BP (!) 139/95   Pulse 75   Temp 98.2 F (36.8 C)   Resp 19   Ht '5\' 10"'$  (1.778 m)   Wt 95.7 kg   SpO2 98%   BMI 30.27 kg/m  Gen:   Awake, no distress   Resp:  Normal effort  MSK:   Moves extremities without difficulty  Other:    Medical Decision Making  Medically screening exam initiated at 8:34 PM.  Appropriate orders placed.  [name] was informed that the remainder of the evaluation will be completed by another provider, this initial triage assessment does not replace that evaluation, and the importance of remaining in the ED until their evaluation is complete.  Cardiac labs Informed RN need for room   Karie Kirks 02/25/22 2035

## 2022-02-26 ENCOUNTER — Encounter (HOSPITAL_COMMUNITY): Payer: Self-pay

## 2022-02-26 ENCOUNTER — Emergency Department (HOSPITAL_COMMUNITY): Payer: PRIVATE HEALTH INSURANCE

## 2022-02-26 DIAGNOSIS — R079 Chest pain, unspecified: Secondary | ICD-10-CM | POA: Diagnosis not present

## 2022-02-26 DIAGNOSIS — R0602 Shortness of breath: Secondary | ICD-10-CM | POA: Diagnosis not present

## 2022-02-26 MED ORDER — ONDANSETRON 8 MG PO TBDP
8.0000 mg | ORAL_TABLET | Freq: Once | ORAL | Status: AC
Start: 2022-02-26 — End: 2022-02-26
  Administered 2022-02-26: 8 mg via ORAL
  Filled 2022-02-26: qty 1

## 2022-02-26 MED ORDER — ALBUTEROL SULFATE (2.5 MG/3ML) 0.083% IN NEBU
5.0000 mg | INHALATION_SOLUTION | Freq: Once | RESPIRATORY_TRACT | Status: AC
  Administered 2022-02-26: 5 mg via RESPIRATORY_TRACT
  Filled 2022-02-26: qty 6

## 2022-02-26 MED ORDER — IPRATROPIUM BROMIDE 0.02 % IN SOLN
0.5000 mg | Freq: Once | RESPIRATORY_TRACT | Status: AC
  Administered 2022-02-26: 0.5 mg via RESPIRATORY_TRACT
  Filled 2022-02-26: qty 2.5

## 2022-02-26 MED ORDER — IOHEXOL 350 MG/ML SOLN
100.0000 mL | Freq: Once | INTRAVENOUS | Status: AC | PRN
  Administered 2022-02-26: 100 mL via INTRAVENOUS

## 2022-02-26 MED ORDER — DEXAMETHASONE SODIUM PHOSPHATE 10 MG/ML IJ SOLN
10.0000 mg | Freq: Once | INTRAMUSCULAR | Status: AC
  Administered 2022-02-26: 10 mg via INTRAVENOUS
  Filled 2022-02-26: qty 1

## 2022-02-26 NOTE — ED Provider Notes (Incomplete)
[facility] DEPT Provider Note   CSN: [id number] Arrival date & time: 02/25/22  2017     History {Add pertinent medical, surgical, social history, OB history to HPI:1} Chief Complaint  Patient presents with  . Chest Pain    Casey Brennan is a 61 y.o. male.  Pt is a 60y/o male with hx of hypertension, hyperlipidemia, HIV who is presenting today with chest pain.  Patient reports today he was just sitting in his recliner when he developed central pain in his chest that went into his left arm and up into his jaw.  It did make him feel slightly short of breath and he feels that the pain is a little bit worse with taking a deep breath.  He recently saw Dr. Shanon Brow with cardiology due to a few other episodes of chest pain he has had in the last few months.  He reports that one of the pain episodes he had prior to this he did have with exertion and it resolved with rest but the second 1 was at rest.  Both of those episodes feels similar to this when excepting only lasted 6 to 7 minutes.  Unfortunately the episode today has been ongoing.  He did try taking 2 nitroglycerin at home and it only mildly lessen the pain.  He was given aspirin by EMS prior to arrival and reports his chest pain is still a 5 out of 10.  He has not had any recent cough congestion or fever.  He has no abdominal pain and denies symptoms starting shortly after eating.  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  He does take aspirin Monday Wednesdays and Fridays when he is on the road and has no prior history of PE or DVT.  He has not noticed recent exertional dyspnea and reports prior to the pain starting while he was sitting in his recliner he had not been having issues.  However alternately he has been having ongoing issues with headaches and neck pain.  He has had prior neck fusion with Dr. Lynann Bologna and has been following up with neuro and orthopedics to see  what can be done about these ongoing worsening headaches.  He does report a headache today before the pain started but it got worse after taking nitroglycerin.  He was recently started on hydralzine during his last visit with cardiology due to persistent hypertension.  He has had a normal cardiac CT in 2021 but they have found on prior echoes an EF of around 37 to 40% due to hypertrophic cardiomyopathy.  Dr. Shanon Brow did discuss following up with a repeat echo and stress test but patient has not had this done yet.  The history is provided by the patient and a relative.  Chest Pain      Home Medications Prior to Admission medications   Medication Sig Start Date End Date Taking? Authorizing Provider  albuterol (PROVENTIL) (2.5 MG/3ML) 0.083% nebulizer solution Take 3 mLs (2.5 mg total) by nebulization every 6 (six) hours as needed for wheezing or shortness of breath. 01/25/22   Campbell Riches, MD  albuterol (VENTOLIN HFA) 108 (90 Base) MCG/ACT inhaler Inhale 2 puffs into the lungs every 6 (six) hours as needed for wheezing or shortness of breath. 01/25/22   Campbell Riches, MD  ALPRAZolam Duanne Moron) 0.5 MG tablet TAKE 1 TABLET(0.5 MG) BY MOUTH TWICE DAILY AS NEEDED FOR SLEEP 12/27/21   Dohmeier, Asencion Partridge, MD  Beclomethasone  Dipropionate (QNASL) 80 MCG/ACT AERS Place 2 sprays into the nose daily. 01/04/21   Rigoberto Noel, MD  bictegravir-emtricitabine-tenofovir AF (BIKTARVY) 50-200-25 MG TABS tablet Take 1 tablet by mouth daily. 06/29/21   Thayer Headings, MD  brimonidine (ALPHAGAN) 0.2 % ophthalmic solution Place 1 drop into the right eye 2 (two) times daily. 02/17/22   [provider]  Budeson-Glycopyrrol-Formoterol (BREZTRI AEROSPHERE) 160-9-4.8 MCG/ACT AERO INHALE 2 PUFFS INTO THE LUNGS IN THE MORNING AND AT BEDTIME 10/04/21   Rigoberto Noel, MD  cetirizine (ZYRTEC) 10 MG tablet Take 1 tablet (10 mg total) by mouth daily. 11/11/21 11/11/22  Biagio Borg, MD  Cholecalciferol (VITAMIN D3) 250  MCG (10000 UT) capsule Take 1 tablet by mouth daily. [employer]'s Made 04/14/21   [provider]  cyanocobalamin (,VITAMIN B-12,) 1000 MCG/ML injection Inject 1 mL (1,000 mcg total) into the muscle every 30 (thirty) days. 11/12/20   Biagio Borg, MD  diphenhydrAMINE (BENADRYL) 25 MG tablet Take 1 tablet (25 mg total) by mouth every 6 (six) hours as needed. Patient taking differently: Take 25 mg by mouth every 6 (six) hours as needed for sleep, itching or allergies. 04/11/21   Alroy Bailiff, Margaux, PA-C  EPINEPHrine 0.3 mg/0.3 mL IJ SOAJ injection Inject 0.3 mg into the muscle as needed for anaphylaxis. 10/18/20   Biagio Borg, MD  gabapentin (NEURONTIN) 300 MG capsule Take by mouth 2 (two) times daily. 12/06/21   [provider]  Glucos-Chond-Hyal Ac-Ca Fructo (MOVE FREE JOINT HEALTH ADVANCE PO) Take 1 tablet by mouth daily.    [provider]  hydrALAZINE (APRESOLINE) 50 MG tablet Take 50 mg by mouth 2 (two) times daily. 02/08/22   [provider]  hydrOXYzine (ATARAX) 50 MG tablet Take 1 tablet (50 mg total) by mouth 2 (two) times daily. 02/08/22   Patwardhan, Reynold Bowen, MD  meclizine (ANTIVERT) 25 MG tablet Take 1 tablet (25 mg total) by mouth daily as needed. 11/11/21   Biagio Borg, MD  methocarbamol (ROBAXIN) 500 MG tablet Take 500 mg by mouth 4 (four) times daily.    [provider]  metoprolol succinate (TOPROL-XL) 100 MG 24 hr tablet Take 1 tablet (100 mg total) by mouth daily. Take with or immediately following a meal. 11/24/21   Biagio Borg, MD  montelukast (SINGULAIR) 10 MG tablet Take 10 mg by mouth daily. 11/15/20   [provider]  Multiple Vitamin (MULTIVITAMIN WITH MINERALS) TABS tablet Take 1 tablet by mouth daily after breakfast.     [provider]  mupirocin ointment (BACTROBAN) 2 % APPLY EXTERNALLY TO THE AFFECTED AREA TWICE DAILY FOR 3 WEEKS, PLACE COTTON BETWEEN TOES TO ALLOW BETTER AERATION AS NEEDED Patient taking differently:  Apply 1 application  topically See admin instructions. APPLY EXTERNALLY TO THE AFFECTED AREA TWICE DAILY FOR 3 WEEKS, PLACE COTTON BETWEEN TOES TO ALLOW BETTER AERATION AS NEEDED 04/20/21   Campbell Riches, MD  nitroGLYCERIN (NITROSTAT) 0.4 MG SL tablet Place 1 tablet (0.4 mg total) under the tongue every 5 (five) minutes as needed for chest pain. 02/08/22 05/09/22  Patwardhan, Reynold Bowen, MD  omeprazole (PRILOSEC) 40 MG capsule Take 1 capsule (40 mg total) by mouth 2 (two) times daily. 04/20/21   Biagio Borg, MD  ondansetron (ZOFRAN-ODT) 4 MG disintegrating tablet Take 1 tablet (4 mg total) by mouth every 8 (eight) hours as needed for nausea or vomiting. 06/29/21   Comer, Okey Regal, MD  OVER THE COUNTER MEDICATION Take  2 capsules by mouth daily. Zhou: thyroid supplement    [provider]  rosuvastatin (CRESTOR) 20 MG tablet Take 1 tablet (20 mg total) by mouth daily. 08/24/21   Biagio Borg, MD  tiZANidine (ZANAFLEX) 4 MG tablet Take 2-4 mg by mouth 3 (three) times daily as needed. 02/10/22   [provider]  tizanidine (ZANAFLEX) 6 MG capsule TAKE 1 CAPSULE(6 MG) BY MOUTH THREE TIMES DAILY 06/28/21   Dohmeier, Asencion Partridge, MD  torsemide (DEMADEX) 20 MG tablet Take 10 mg by mouth See admin instructions. Tuesday Thursday Saturday    [provider]  Ubrogepant (UBRELVY) 100 MG TABS Take 100 mg by mouth 2 (two) times daily as needed (can take a seecond dose 2 hiurs after first do not exceed 2 a day.). 10/31/21   Dohmeier, Asencion Partridge, MD  zolpidem (AMBIEN) 5 MG tablet Take 1-2 tablets (5-10 mg total) by mouth at bedtime as needed for sleep. Patient not taking: Reported on 04/23/2019 12/21/16 05/19/19  Mignon Pine, DO      Allergies    Amoxapine, Chocolate, Ciprofloxacin, Descovy [emtricitabine-tenofovir af], Genvoya [elviteg-cobic-emtricit-tenofaf], Morphine and related, Oxycodone-acetaminophen, Penicillins, Sulfa antibiotics, Tomato, Vancomycin, Darunavir, Amlodipine, Carvedilol,  Flagyl [metronidazole], Milk-related compounds, Morphine, Penicillin g, Toradol [ketorolac tromethamine], Acetaminophen, and Tramadol    Review of Systems   Review of Systems  Cardiovascular:  Positive for chest pain.    Physical Exam Updated Vital Signs BP (!) 139/94   Pulse (!) 57   Temp 98.2 F (36.8 C)   Resp 13   Ht '5\' 10"'$  (1.778 m)   Wt 95.7 kg   SpO2 95%   BMI 30.27 kg/m  Physical Exam Vitals and nursing note reviewed.  Constitutional:      General: He is not in acute distress.    Appearance: He is well-developed.  HENT:     Head: Normocephalic and atraumatic.  Eyes:     Conjunctiva/sclera: Conjunctivae normal.     Pupils: Pupils are equal, round, and reactive to light.  Cardiovascular:     Rate and Rhythm: Normal rate and regular rhythm.     Pulses: Normal pulses.     Heart sounds: No murmur heard. Pulmonary:     Effort: Pulmonary effort is normal. No respiratory distress.     Breath sounds: Normal breath sounds. No wheezing or rales.     Comments: Minimal tenderness with palpation to the sternum Chest:     Chest wall: Tenderness present.  Abdominal:     General: There is no distension.     Palpations: Abdomen is soft.     Tenderness: There is no abdominal tenderness. There is no guarding or rebound.  Musculoskeletal:        General: No tenderness. Normal range of motion.     Cervical back: Normal range of motion and neck supple.     Right lower leg: No edema.     Left lower leg: No edema.  Skin:    General: Skin is warm and dry.     Findings: No erythema or rash.  Neurological:     Mental Status: He is alert and oriented to person, place, and time.  Psychiatric:        Mood and Affect: Mood normal.        Behavior: Behavior normal.     ED Results / Procedures / Treatments   Labs (all labs ordered are listed, but only abnormal results are displayed) Labs Reviewed  BASIC METABOLIC PANEL - Abnormal; Notable for the  following components:       Result Value   Glucose, Bld 110 (*)    Calcium 8.6 (*)    All other components within normal limits  CBC - Abnormal; Notable for the following components:   RBC 3.87 (*)    Hemoglobin 12.1 (*)    HCT 36.9 (*)    All other components within normal limits  D-DIMER, QUANTITATIVE - Abnormal; Notable for the following components:   D-Dimer, Quant 0.54 (*)    All other components within normal limits  TROPONIN I (HIGH SENSITIVITY)  TROPONIN I (HIGH SENSITIVITY)    EKG EKG Interpretation  Date/Time:  Saturday February 25 2022 20:27:02 EDT Ventricular Rate:  73 PR Interval:  179 QRS Duration: 86 QT Interval:  382 QTC Calculation: 421 R Axis:   50 Text Interpretation: Sinus rhythm Atrial premature complexes Minimal ST elevation, anterior leads Confirmed by Blanchie Dessert [phone]) on 02/25/2022 9:03:35 PM  Radiology DG Chest 2 View  Result Date: 02/25/2022 CLINICAL DATA:  BIB [facility] with c/o midsternal chest pain radiating to jaw and L arm since 1 hour PTA. Hx of HTN, recently had meds changed. H/o prostate cancer as well. EXAM: CHEST - 2 VIEW COMPARISON:  06/26/2021 FINDINGS: Lungs are clear.  Prominent nipple shadows as before. Heart size and mediastinal contours are within normal limits. No effusion. Cholecystectomy clips. Cervical fixation hardware partially visualized. IMPRESSION: No acute cardiopulmonary disease. Electronically Signed   By: Lucrezia Europe M.D.   On: 02/25/2022 20:49    Procedures Procedures  {Document cardiac monitor, telemetry assessment procedure when appropriate:1}  Medications Ordered in ED Medications  HYDROmorphone (DILAUDID) injection 0.5 mg (0.5 mg Intravenous Given 02/25/22 2139)    ED Course/ Medical Decision Making/ A&P                           Medical Decision Making Amount and/or Complexity of Data Reviewed Labs: ordered. Radiology: ordered.  Risk Prescription drug management.   Pt with multiple medical problems and comorbidities and  presenting today with a complaint that caries a high risk for morbidity and mortality.  Here today with complaint of persistent chest pain.  Patient does have several risk factors for ACS.  He did have a cardiac CT and calcium score done in 2021 which was completely normal.  He has had echo in the same timeframe that did show a reduced EF of 37 to 40%.  He has recently followed up with cardiology who recommended repeat echo and stress test but he has not had that done.  He had his third episode of pain today while at rest.  He denies symptoms suggestive of pneumonia, pneumothorax or pericarditis.  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  Patient is not having any GI symptoms has no abdominal pain at this time and low suspicion for an acute GI cause of his symptoms.  Blood pressure has been improving after starting his new blood pressure medication 3 weeks ago and pulses are equal in all extremities with lower suspicion for dissection at this time.  I independently interpreted patient's EKG and labs today.  EKG does not show significant changes from prior.  D-dimer is 0.54 mildly elevated, BMP is within normal limits and CBC without acute changes.  Troponin initially is negative at  3.  I have independently visualized and interpreted pt's images today.  Chest x-ray today is negative.  Given patient has already received aspirin and nitroglycerin for his pain and had only moderate resolution will give pain medication.  Patient has 21 allergies and has limited possibilities for medications.  He reports he has tolerated Dilaudid and fentanyl in the past.  He is allergic to morphine.  We will give a dose of pain medication to see if that improves his symptoms.  We will repeat a troponin.     {Document critical care time when  appropriate:1} {Document review of labs and clinical decision tools ie heart score, Chads2Vasc2 etc:1}  {Document your independent review of radiology images, and any outside records:1} {Document your discussion with family members, caretakers, and with consultants:1} {Document social determinants of health affecting pt's care:1} {Document your decision making why or why not admission, treatments were needed:1} Final Clinical Impression(s) / ED Diagnoses Final diagnoses:  None    Rx / DC Orders ED Discharge Orders     None

## 2022-02-28 ENCOUNTER — Encounter: Payer: Self-pay | Admitting: Internal Medicine

## 2022-02-28 NOTE — Assessment & Plan Note (Signed)
Last vitamin D Lab Results  Component Value Date   VD25OH 42.22 08/24/2021   Stable, cont oral replacement  

## 2022-02-28 NOTE — Assessment & Plan Note (Signed)
BP Readings from Last 3 Encounters:  02/26/22 (!) 144/98  02/24/22 130/86  02/08/22 (!) 144/91   Uncontrolled, likely reactive, pt to continue medical treatment hydralazine 50 mg bid, toprol xl 100 qd,

## 2022-02-28 NOTE — Assessment & Plan Note (Signed)
Acute onset, for toradol 30 mg IM, depomedrol I'm 80 mg, f/u MRI as per neurology,  to f/u any worsening symptoms or concerns

## 2022-02-28 NOTE — Assessment & Plan Note (Signed)
Also has milder symptoms related to left neck today, for tx as above

## 2022-03-01 DIAGNOSIS — R519 Headache, unspecified: Secondary | ICD-10-CM | POA: Diagnosis not present

## 2022-03-01 NOTE — Telephone Encounter (Signed)
Casey Brennan called to let us know that the patient not able to get contrast due to his vein being too hard to find. They completed the MRI without contrast. The tech said there were no masses visible. They said if you still want the MRI done with contrast, he will need to be sent to the hospital where an ultrasound tech can be used to find a vein.

## 2022-03-01 NOTE — Telephone Encounter (Signed)
Dr Dohmeier aware and is ok without contrast

## 2022-03-02 ENCOUNTER — Encounter: Payer: Self-pay | Admitting: *Deleted

## 2022-03-02 ENCOUNTER — Encounter: Payer: Self-pay | Admitting: Pulmonary Disease

## 2022-03-02 ENCOUNTER — Ambulatory Visit (INDEPENDENT_AMBULATORY_CARE_PROVIDER_SITE_OTHER): Payer: PRIVATE HEALTH INSURANCE | Admitting: Pulmonary Disease

## 2022-03-02 ENCOUNTER — Other Ambulatory Visit: Payer: Self-pay | Admitting: Neurology

## 2022-03-02 DIAGNOSIS — J449 Chronic obstructive pulmonary disease, unspecified: Secondary | ICD-10-CM

## 2022-03-02 DIAGNOSIS — M542 Cervicalgia: Secondary | ICD-10-CM | POA: Diagnosis not present

## 2022-03-02 DIAGNOSIS — G43009 Migraine without aura, not intractable, without status migrainosus: Secondary | ICD-10-CM

## 2022-03-02 DIAGNOSIS — A879 Viral meningitis, unspecified: Secondary | ICD-10-CM

## 2022-03-02 DIAGNOSIS — G4459 Other complicated headache syndrome: Secondary | ICD-10-CM

## 2022-03-02 DIAGNOSIS — G43109 Migraine with aura, not intractable, without status migrainosus: Secondary | ICD-10-CM

## 2022-03-02 MED ORDER — PREDNISONE 10 MG PO TABS: ORAL_TABLET | ORAL | 0 refills | Status: DC

## 2022-03-02 MED ORDER — DOXYCYCLINE HYCLATE 100 MG PO TABS: 100.0000 mg | ORAL_TABLET | Freq: Two times a day (BID) | ORAL | 0 refills | Status: DC

## 2022-03-02 NOTE — Telephone Encounter (Signed)
Pt's girlfriend, [name] (on [place]) Pt did not get the MRI with contrast only without contrast. MRI tech instructed to call Dr. Brett Fairy so someone can be there to do with contrast. Pt has a MRI of neck today at 3:00 pm asking if Dr. Brett Fairy can call to approve to get done today. Would like a call from the nurse.  Contact info: [phone]

## 2022-03-02 NOTE — Progress Notes (Signed)
   Subjective:    Patient ID: Casey Brennan, male    DOB: Oct 01, 1960, 61 y.o.   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   HIV - diagnosed 3 ,last CD 4 479 03/2020 He smoked more than 30 pack years before he quit in 2012  Last seen 08/2020 -sample of breztri instead of Dulera and Spiriva 12/2020 doxy & pred taper  He arrives today accompanied by his sister who is also our patient. He reports cough and wheezing for about 1 week.  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 He developed chest pain and had an ED visit 8/27, CT angiogram was negative.  He was given Solu-Medrol on discharge.  He reports yellow sputum Cardiology/Dr. Virgina Jock has started him on hydralazine and nitrates and echocardiogram and stress test has been scheduled  Significant tests/ events reviewed  02/26/22 CTA chest >> RUL 3m nodule, likele LN CT cors 04/2020 ca score 0   spirometry 07/22/2014  FEV1  1.12 (34%) ratio 59  AEC 900   Review of Systems neg for any significant sore throat, dysphagia, itching, sneezing, nasal congestion or excess/ purulent secretions, fever, chills, sweats, unintended wt loss, pleuritic or exertional cp, hempoptysis, orthopnea pnd or change in chronic leg swelling. Also denies presyncope, palpitations, heartburn, abdominal pain, nausea, vomiting, diarrhea or change in bowel or urinary habits, dysuria,hematuria, rash, arthralgias, visual complaints, headache, numbness weakness or ataxia.     Objective:   Physical Exam  Gen. Pleasant, well-nourished, in no distress ENT - no thrush, no pallor/icterus,no post nasal drip Neck: No JVD, no thyromegaly, no carotid bruits Lungs: no use of accessory muscles, no dullness to percussion, clear without rales or rhonchi  Cardiovascular: Rhythm  regular, heart sounds  normal, no murmurs or gallops, no peripheral edema Musculoskeletal: No deformities, no cyanosis or clubbing        Assessment & Plan:

## 2022-03-02 NOTE — Patient Instructions (Signed)
x doxycycline 100 mg twice daily for 7 days X Prednisone 10 mg tabs  Take 2 tabs daily with food x 5ds, then 1 tab daily with food x 5ds then STOP   Follow-up in 2 months with PFTs

## 2022-03-02 NOTE — Telephone Encounter (Signed)
Called the sister back and there was no answer. LVM advising that Dr Brett Fairy is ok with the patient having it done without contrast if they are unable to get it with contrast.

## 2022-03-02 NOTE — Assessment & Plan Note (Signed)
I will treat him for an exacerbation with a course of doxycycline and low-dose prednisone. He will continue on his Breztri and use albuterol for rescue.   We will assess with PFTs in 2 months and compare with previous .  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

## 2022-03-02 NOTE — Telephone Encounter (Signed)
Pt's sister called back, message from [place], [place] was read to her.  Pt's sister states pt is due to return to work on 09-05.  Sister was connected to MRI coordinator to discuss going to another location.  Pt's sister is still asking for a call back from [place], [place] when she is available.

## 2022-03-02 NOTE — Telephone Encounter (Signed)
Called the patient's sister back and she would really like to move forward with MRI with contrast and has asked that be re entered to be completed it was completed through [employer] office and she thinks it was a skill competency issues and not normally a problem

## 2022-03-03 ENCOUNTER — Ambulatory Visit: Payer: Medicare Other

## 2022-03-03 DIAGNOSIS — R072 Precordial pain: Secondary | ICD-10-CM | POA: Diagnosis not present

## 2022-03-07 ENCOUNTER — Other Ambulatory Visit: Payer: Self-pay | Admitting: Cardiology

## 2022-03-16 ENCOUNTER — Ambulatory Visit: Payer: Medicare Other | Admitting: Cardiology

## 2022-03-18 ENCOUNTER — Other Ambulatory Visit: Payer: PRIVATE HEALTH INSURANCE

## 2022-03-20 ENCOUNTER — Other Ambulatory Visit: Payer: Self-pay | Admitting: Infectious Diseases

## 2022-03-20 DIAGNOSIS — B20 Human immunodeficiency virus [HIV] disease: Secondary | ICD-10-CM

## 2022-03-22 ENCOUNTER — Telehealth: Payer: Self-pay | Admitting: Neurology

## 2022-03-22 DIAGNOSIS — H401114 Primary open-angle glaucoma, right eye, indeterminate stage: Secondary | ICD-10-CM | POA: Diagnosis not present

## 2022-03-22 DIAGNOSIS — Z79899 Other long term (current) drug therapy: Secondary | ICD-10-CM | POA: Diagnosis not present

## 2022-03-22 DIAGNOSIS — H40022 Open angle with borderline findings, high risk, left eye: Secondary | ICD-10-CM | POA: Diagnosis not present

## 2022-03-22 NOTE — Telephone Encounter (Signed)
Called sister, April back. I recommended they f/u with PCP about his ongoing issue with cold feet. May want him to see vein/vascular, do further testing. She verbalized understanding.  States migraines have worsened since last seen. Has MRI Brain scheduled 03/25/22 at [facility]. States pain located at back of head and radiates down what he feels is his brainstem. Typically pain on sides of head. Took Ubrevly, tizanidine, and gabapentin yesterday. Repeated regimen today but ineffective.   He is going to see Dr. Raynelle Fanning (ophthalmologist) at [facility] at 4pm due to blurry vision he has had for about a month that is constant.   Aware I will speak with Dr. Brett Fairy and call back latest tomorrow with her recommendation.

## 2022-03-22 NOTE — Telephone Encounter (Signed)
Called and spoke w/ April. Advised per Dr. Brett Fairy she would like him to get evaluation done w/ Dr. Edilia Bo today first. If it shows elevated pressure, she may want to order repeat LP for further evaluation. If not, she wants him to proceed w/ MRI 03/25/22 to determine if there are other things causing sx. He can take excedrin prn. She states he cannot take this or tylenol. Has been using aleve prn and will continue this. She will call back if any other questions/concerns.

## 2022-03-22 NOTE — Telephone Encounter (Signed)
Pt sister ( April) is calling. Stated Pt feet is really cold and having bad headaches. April is requesting a call back from the nurse

## 2022-03-24 ENCOUNTER — Other Ambulatory Visit: Payer: Self-pay

## 2022-03-24 MED ORDER — EPINEPHRINE 0.3 MG/0.3ML IJ SOAJ: 0.3000 mg | INTRAMUSCULAR | 5 refills | Status: DC | PRN

## 2022-03-25 ENCOUNTER — Ambulatory Visit
Admission: RE | Admit: 2022-03-25 | Discharge: 2022-03-25 | Disposition: A | Discharge status: Home / Self Care | Payer: PRIVATE HEALTH INSURANCE | Source: Ambulatory Visit | Attending: Neurology | Admitting: Neurology

## 2022-03-25 DIAGNOSIS — G4459 Other complicated headache syndrome: Secondary | ICD-10-CM | POA: Diagnosis not present

## 2022-03-25 MED ORDER — GADOBENATE DIMEGLUMINE 529 MG/ML IV SOLN
20.0000 mL | Freq: Once | INTRAVENOUS | Status: AC | PRN
Start: 2022-03-25 — End: 2022-03-25
  Administered 2022-03-25: 20 mL via INTRAVENOUS

## 2022-03-27 ENCOUNTER — Encounter: Payer: Self-pay | Admitting: Neurology

## 2022-03-28 ENCOUNTER — Other Ambulatory Visit: Payer: Self-pay | Admitting: Neurology

## 2022-03-28 DIAGNOSIS — G4700 Insomnia, unspecified: Secondary | ICD-10-CM

## 2022-03-28 NOTE — Telephone Encounter (Signed)
Pt has an up coming appt and has been checked on the registry

## 2022-03-30 ENCOUNTER — Telehealth: Payer: Self-pay | Admitting: Internal Medicine

## 2022-03-30 NOTE — Telephone Encounter (Signed)
Error/disregard

## 2022-03-31 ENCOUNTER — Other Ambulatory Visit: Payer: Medicare Other

## 2022-04-06 ENCOUNTER — Ambulatory Visit (INDEPENDENT_AMBULATORY_CARE_PROVIDER_SITE_OTHER): Payer: PRIVATE HEALTH INSURANCE | Admitting: Internal Medicine

## 2022-04-06 ENCOUNTER — Encounter: Payer: Self-pay | Admitting: Internal Medicine

## 2022-04-06 VITALS — BP 132/78 | HR 75 | Temp 98.8°F | Wt 212.0 lb

## 2022-04-06 DIAGNOSIS — I10 Essential (primary) hypertension: Secondary | ICD-10-CM | POA: Diagnosis not present

## 2022-04-06 DIAGNOSIS — J329 Chronic sinusitis, unspecified: Secondary | ICD-10-CM

## 2022-04-06 DIAGNOSIS — Z23 Encounter for immunization: Secondary | ICD-10-CM

## 2022-04-06 DIAGNOSIS — Z981 Arthrodesis status: Secondary | ICD-10-CM

## 2022-04-06 MED ORDER — AZITHROMYCIN 250 MG PO TABS: ORAL_TABLET | ORAL | 1 refills | Status: AC

## 2022-04-06 NOTE — Patient Instructions (Signed)
Please take all new medication as prescribed - the antibiotic  Please continue all other medications as before, and refills have been done if requested.  Please have the pharmacy call with any other refills you may need.  Please keep your appointments with your specialists as you may have planned - Dr Lynann Bologna soon  No lab work needed today

## 2022-04-06 NOTE — Assessment & Plan Note (Signed)
Mild to mod, for antibx course zpack,  to f/u any worsening symptoms or concerns 

## 2022-04-06 NOTE — Progress Notes (Signed)
Patient ID: [name], male   DOB: Aug 06, 1960, 61 y.o.   MRN: [id number]        Chief Complaint: follow up sinus pain, neck pain       HPI:  Casey Brennan is a 61 y.o. male here overall doing ok, but  Here with 2-3 days acute onset fever, facial pain, pressure, headache, general weakness and malaise, and greenish d/c, with mild ST and cough, but pt denies chest pain, wheezing, increased sob or doe, orthopnea, PND, increased LE swelling, palpitations, dizziness or syncope. Does have several days of worsening now right sided neck pain and arm pain with numbness in several fingers, had recent MRI c spine per ortho and plan to f/u Dr Lynann Bologna about 2 wks.          Wt Readings from Last 3 Encounters:  04/06/22 212 lb (96.2 kg)  03/02/22 210 lb 9.6 oz (95.5 kg)  02/25/22 210 lb 15.7 oz (95.7 kg)   BP Readings from Last 3 Encounters:  04/06/22 132/78  03/02/22 128/72  02/26/22 (!) 144/98         Past Medical History:  Diagnosis Date   Asthma    very rare   Chronic anemia    normocytic   Chronic folliculitis    Environmental and seasonal allergies    H/O pericarditis    2010--  myopercarditis--  resolved   Headache(784.0)    HX SEVERE FRONTAL HA'S   Hiatal hernia    History of gastric ulcer    History of kidney stones    History of MRSA infection 2010   infected boil   HIV (human immunodeficiency virus infection) ([place]) 1988   Hypertension    Lytic bone lesion of hip    WORK-UP DONE BY ONCOLOGIST DR HA --  NOT MALIGNANT   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   Pneumonia    hx of   Post concussion syndrome    resolved   Prostate cancer ([place]) 04/25/2013   gleason 3+3=6, volume 30 gm   Ulcer    hx of gastric   Past Surgical History:  Procedure Laterality Date   ACDCF  09/29/2020   ANTERIOR CERVICAL DECOMPRESSION/DISCECTOMY FUSION 4 LEVELS N/A 09/29/2020   Procedure: ANTERIOR CERVICAL DECOMPRESSION FUSION CERVICAL 3-CERVICAL 4 , CERVICAL 4 - CERVICAL 5, CERVICAL 5 -  CERVICAL 6 WITH INSTRUMENTATION AND ALLOGRAFT;  Surgeon: Phylliss Bob, MD;  Location: [place];  Service: Orthopedics;  Laterality: N/A;   CARDIAC CATHETERIZATION  03-23-2009  DR COOPER   NORMAL CORONARY ARTERIES   CHOLECYSTECTOMY N/A 11/06/2014   Procedure: LAPAROSCOPIC CHOLECYSTECTOMY WITH INTRAOPERATIVE CHOLANGIOGRAM;  Surgeon: Judeth Horn, MD;  Location: [place];  Service: General;  Laterality: N/A;   COLONOSCOPY  12/26/2011   Procedure: COLONOSCOPY;  Surgeon: Lear Ng, MD;  Location: [facility];  Service: Endoscopy;  Laterality: N/A;   COLONOSCOPY     COLONOSCOPY WITH PROPOFOL N/A 07/29/2014   Procedure: COLONOSCOPY WITH PROPOFOL;  Surgeon: Lear Ng, MD;  Location: [place];  Service: Endoscopy;  Laterality: N/A;   CYSTOSCOPY N/A 05/18/2018   Procedure: CYSTOSCOPY REMOVAL FOREIGN BODY;  Surgeon: Kathie Rhodes, MD;  Location: [facility];  Service: Urology;  Laterality: N/A;   CYSTOSCOPY/RETROGRADE/URETEROSCOPY Bilateral 04/25/2013   Procedure: CYSTOSCOPY/ BILATERAL RETROGRADES; BLADDER BIOPSIES;  Surgeon: Alexis Frock, MD;  Location: [facility];  Service: Urology;  Laterality: Bilateral;   DENTAL EXAMINATION UNDER ANESTHESIA     ESOPHAGOGASTRODUODENOSCOPY  12/26/2011   Procedure: ESOPHAGOGASTRODUODENOSCOPY (EGD);  Surgeon: Lear Ng, MD;  Location: [facility];  Service: Endoscopy;  Laterality: N/A;   ESOPHAGOGASTRODUODENOSCOPY (EGD) WITH PROPOFOL N/A 07/29/2014   Procedure: ESOPHAGOGASTRODUODENOSCOPY (EGD) WITH PROPOFOL;  Surgeon: Lear Ng, MD;  Location: [place];  Service: Endoscopy;  Laterality: N/A;   EXCISION CHRONIC LEFT BREAST ABSCESS  09-21-2010   EXCISIONAL BX LEFT BREAST MASS/  I  &  D LEFT BREAST ABSCESS  03-24-2009   KNEE ARTHROSCOPY Right 1985   left axilla biopsy  04/2013   left hip biopsy  10/2012   LYMPHADENECTOMY Bilateral 08/18/2013   Procedure: LYMPHADENECTOMY "PELVIC LYMPH NODE DISSECTION";  Surgeon: Alexis Frock, MD;  Location: [facility];  Service: Urology;  Laterality: Bilateral;   PROSTATE BIOPSY N/A 04/25/2013   Procedure: BIOPSY TRANSRECTAL ULTRASONIC PROSTATE (TUBP);  Surgeon: Alexis Frock, MD;  Location: [facility];  Service: Urology;  Laterality: N/A;   ROBOT ASSISTED LAPAROSCOPIC RADICAL PROSTATECTOMY N/A 08/18/2013   Procedure: ROBOTIC ASSISTED LAPAROSCOPIC RADICAL PROSTATECTOMY;  Surgeon: Alexis Frock, MD;  Location: [facility];  Service: Urology;  Laterality: N/A;   TOTAL KNEE ARTHROPLASTY Right 08/01/2016   08/17/16   TOTAL KNEE ARTHROPLASTY Right 08/01/2016   Procedure: TOTAL KNEE ARTHROPLASTY;  Surgeon: Renette Butters, MD;  Location: [place];  Service: Orthopedics;  Laterality: Right;   UPPER GASTROINTESTINAL ENDOSCOPY     UPPER LEG SOFT TISSUE BIOPSY Left 2012   lthigh    reports that he quit smoking about 11 years ago. His smoking use included cigars and cigarettes. He has a 34.50 pack-year smoking history. He has never used smokeless tobacco. He reports that he does not currently use alcohol. He reports that he does not use drugs. family history includes Asthma in his father and sister; Cancer in his father and maternal uncle; Diabetes in his father; Hypertension in his sister; Stroke in his father. Allergies  Allergen Reactions   Amoxapine Shortness Of Breath   Chocolate Shortness Of Breath and Other (See Comments)    Asthma attack, welts   Ciprofloxacin Anaphylaxis, Dermatitis, Itching, Nausea And Vomiting, Shortness Of Breath, Swelling and Hives   Descovy [Emtricitabine-Tenofovir Af] Shortness Of Breath    Nasuea, vomiiting, diarrhea, sob, whelps, throat closes   Genvoya [Elviteg-Cobic-Emtricit-Tenofaf] Shortness Of Breath    Nausea, vomitting, diarreha, sob, rash, throat closing   Morphine And Related Shortness Of Breath, Itching and Other (See Comments)     chest pain   Oxycodone-Acetaminophen Shortness Of Breath   Penicillins Shortness Of Breath and Rash     Has patient had a PCN reaction causing immediate rash, facial/tongue/throat swelling, SOB or lightheadedness with hypotension: Yes Has patient had a PCN reaction causing severe rash involving mucus membranes or skin necrosis: No Has patient had a PCN reaction that required hospitalization No Has patient had a PCN reaction occurring within the last 10 years: Yes If all of the above answers are "NO", then may proceed with Cephalosporin use.   Sulfa Antibiotics Anaphylaxis and Other (See Comments)   Tomato Shortness Of Breath   Vancomycin Shortness Of Breath and Other (See Comments)   Darunavir Itching   Amlodipine Other (See Comments)    swelling   Carvedilol     LEG SWELLING, DIZZNESS   Flagyl [Metronidazole] Itching   Milk-Related Compounds    Morphine Other (See Comments)   Penicillin G Other (See Comments)   Toradol [Ketorolac Tromethamine] Nausea And Vomiting    itching   Acetaminophen Rash and Other (See Comments)    Pt tolerates  percocet and also plain APAP without rash   Tramadol Itching, Nausea And Vomiting and Other (See Comments)   Current Outpatient Medications on File Prior to Visit  Medication Sig Dispense Refill   albuterol (PROVENTIL) (2.5 MG/3ML) 0.083% nebulizer solution Take 3 mLs (2.5 mg total) by nebulization every 6 (six) hours as needed for wheezing or shortness of breath. 75 mL 3   albuterol (VENTOLIN HFA) 108 (90 Base) MCG/ACT inhaler Inhale 2 puffs into the lungs every 6 (six) hours as needed for wheezing or shortness of breath. 36 g 3   ALPRAZolam (XANAX) 0.5 MG tablet TAKE 1 TABLET(0.5 MG) BY MOUTH TWICE DAILY AS NEEDED FOR SLEEP 60 tablet 0   Beclomethasone Dipropionate (QNASL) 80 MCG/ACT AERS Place 2 sprays into the nose daily. 10.6 g 2   bictegravir-emtricitabine-tenofovir AF (BIKTARVY) 50-200-25 MG TABS tablet Take 1 tablet by mouth daily. 30 tablet 11   brimonidine (ALPHAGAN) 0.2 % ophthalmic solution Place 1 drop into the right eye 2 (two) times daily.      Budeson-Glycopyrrol-Formoterol (BREZTRI AEROSPHERE) 160-9-4.8 MCG/ACT AERO INHALE 2 PUFFS INTO THE LUNGS IN THE MORNING AND AT BEDTIME 10.7 g 0   cetirizine (ZYRTEC) 10 MG tablet Take 1 tablet (10 mg total) by mouth daily. 90 tablet 3   Cholecalciferol (VITAMIN D3) 250 MCG (10000 UT) capsule Take 1 tablet by mouth daily. [employer]     cyanocobalamin (,VITAMIN B-12,) 1000 MCG/ML injection Inject 1 mL (1,000 mcg total) into the muscle every 30 (thirty) days. 3 mL 3   diphenhydrAMINE (BENADRYL) 25 MG tablet Take 1 tablet (25 mg total) by mouth every 6 (six) hours as needed. (Patient taking differently: Take 25 mg by mouth every 6 (six) hours as needed for sleep, itching or allergies.) 30 tablet 0   doxycycline (VIBRA-TABS) 100 MG tablet Take 1 tablet (100 mg total) by mouth 2 (two) times daily. 14 tablet 0   EPINEPHrine 0.3 mg/0.3 mL IJ SOAJ injection Inject 0.3 mg into the muscle as needed for anaphylaxis. 1 each 5   gabapentin (NEURONTIN) 300 MG capsule Take by mouth 2 (two) times daily.     Glucos-Chond-Hyal Ac-Ca Fructo (MOVE FREE JOINT HEALTH ADVANCE PO) Take 1 tablet by mouth daily.     hydrALAZINE (APRESOLINE) 50 MG tablet Take 50 mg by mouth 2 (two) times daily.     hydrOXYzine (ATARAX) 50 MG tablet Take 1 tablet (50 mg total) by mouth 2 (two) times daily. 60 tablet 1   meclizine (ANTIVERT) 25 MG tablet Take 1 tablet (25 mg total) by mouth daily as needed. 40 tablet 5   methocarbamol (ROBAXIN) 500 MG tablet Take 500 mg by mouth 4 (four) times daily.     metoprolol succinate (TOPROL-XL) 100 MG 24 hr tablet Take 1 tablet (100 mg total) by mouth daily. Take with or immediately following a meal. 90 tablet 3   montelukast (SINGULAIR) 10 MG tablet Take 10 mg by mouth daily.     Multiple Vitamin (MULTIVITAMIN WITH MINERALS) TABS tablet Take 1 tablet by mouth daily after breakfast.      mupirocin ointment (BACTROBAN) 2 % APPLY EXTERNALLY TO THE AFFECTED AREA TWICE DAILY FOR 3 WEEKS, PLACE COTTON  BETWEEN TOES TO ALLOW BETTER AERATION AS NEEDED (Patient taking differently: Apply 1 application  topically See admin instructions. APPLY EXTERNALLY TO THE AFFECTED AREA TWICE DAILY FOR 3 WEEKS, PLACE COTTON BETWEEN TOES TO ALLOW BETTER AERATION AS NEEDED) 66 g 1   nitroGLYCERIN (NITROSTAT) 0.4 MG SL tablet Place  1 tablet (0.4 mg total) under the tongue every 5 (five) minutes as needed for chest pain. 30 tablet 1   omeprazole (PRILOSEC) 40 MG capsule Take 1 capsule (40 mg total) by mouth 2 (two) times daily. 180 capsule 2   ondansetron (ZOFRAN-ODT) 4 MG disintegrating tablet Take 1 tablet (4 mg total) by mouth every 8 (eight) hours as needed for nausea or vomiting. 30 tablet 2   OVER THE COUNTER MEDICATION Take 2 capsules by mouth daily. Zhou: thyroid supplement     predniSONE (DELTASONE) 10 MG tablet 2 daily x 5 days, 1 daily x 5 days, then stop 15 tablet 0   rosuvastatin (CRESTOR) 20 MG tablet Take 1 tablet (20 mg total) by mouth daily. 90 tablet 3   tiZANidine (ZANAFLEX) 4 MG tablet Take 2-4 mg by mouth 3 (three) times daily as needed.     tizanidine (ZANAFLEX) 6 MG capsule TAKE 1 CAPSULE(6 MG) BY MOUTH THREE TIMES DAILY 90 capsule 11   torsemide (DEMADEX) 20 MG tablet Take 10 mg by mouth See admin instructions. Tuesday Thursday Saturday     Ubrogepant (UBRELVY) 100 MG TABS Take 100 mg by mouth 2 (two) times daily as needed (can take a seecond dose 2 hiurs after first do not exceed 2 a day.). 10 tablet 5   [DISCONTINUED] zolpidem (AMBIEN) 5 MG tablet Take 1-2 tablets (5-10 mg total) by mouth at bedtime as needed for sleep. (Patient not taking: Reported on 04/23/2019) 30 tablet 2   No current facility-administered medications on file prior to visit.        ROS:  All others reviewed and negative.  Objective        PE:  BP 132/78 (BP Location: Right Arm, Patient Position: Sitting, Cuff Size: Normal)   Pulse 75   Temp 98.8 F (37.1 C) (Oral)   Wt 212 lb (96.2 kg)   SpO2 96%   BMI 30.42 kg/m                  Constitutional: Pt appears in NAD               HENT: Head: NCAT.                Right Ear: External ear normal.                 Left Ear: External ear normal. Bilat tm's with mild erythema.  Max sinus areas mild tender.  Pharynx with mild erythema, no exudate               Eyes: . Pupils are equal, round, and reactive to light. Conjunctivae and EOM are normal               Nose: without d/c or deformity               Neck: Neck supple. Gross normal ROM               Cardiovascular: Normal rate and regular rhythm.                 Pulmonary/Chest: Effort normal and breath sounds without rales or wheezing.                Abd:  Soft, NT, ND, + BS, no organomegaly               Neurological: Pt is alert. At baseline orientation, motor grossly intact  Skin: Skin is warm. No rashes, no other new lesions, LE edema - none               Psychiatric: Pt behavior is normal without agitation   Micro: none  Cardiac tracings I have personally interpreted today:  none  Pertinent Radiological findings (summarize): none   Lab Results  Component Value Date   WBC 5.4 02/25/2022   HGB 12.1 (L) 02/25/2022   HCT 36.9 (L) 02/25/2022   PLT 184 02/25/2022   GLUCOSE 110 (H) 02/25/2022   CHOL 155 08/24/2021   TRIG 98.0 08/24/2021   HDL 46.40 08/24/2021   LDLCALC 89 08/24/2021   ALT 16 01/25/2022   AST 17 01/25/2022   NA 140 02/25/2022   K 4.1 02/25/2022   CL 110 02/25/2022   CREATININE 1.19 02/25/2022   BUN 10 02/25/2022   CO2 25 02/25/2022   TSH 1.48 08/24/2021   PSA 0.00 (L) 08/24/2021   INR 1.0 06/23/2021   HGBA1C <4.0 08/25/2021   MICROALBUR 16.88 (H) 10/05/2011   Assessment/Plan:  Casey Brennan is a 61 y.o. 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  Sinusitis Mild to mod, for antibx course zpack,  to f/u any worsening symptoms or concerns  Essential hypertension BP Readings from Last 3 Encounters:  04/06/22 132/78  03/02/22 128/72  02/26/22 (!) 144/98   Stable, pt to continue medical treatment hydralazine 50 mg bid   S/P cervical spinal fusion With worsening post right neck pain with RUE symptoms overall mild, to f/u ortho as planned  Followup: Return in about 6 weeks (around 05/18/2022), or if symptoms worsen or fail to improve.  Cathlean Cower, MD 04/06/2022 1:08 PM [facility]

## 2022-04-06 NOTE — Assessment & Plan Note (Signed)
BP Readings from Last 3 Encounters:  04/06/22 132/78  03/02/22 128/72  02/26/22 (!) 144/98   Stable, pt to continue medical treatment hydralazine 50 mg bid

## 2022-04-06 NOTE — Assessment & Plan Note (Signed)
With worsening post right neck pain with RUE symptoms overall mild, to f/u ortho as planned

## 2022-04-07 ENCOUNTER — Ambulatory Visit (INDEPENDENT_AMBULATORY_CARE_PROVIDER_SITE_OTHER): Payer: PRIVATE HEALTH INSURANCE

## 2022-04-07 VITALS — Ht 70.0 in | Wt 212.0 lb

## 2022-04-07 DIAGNOSIS — Z0000 Encounter for general adult medical examination without abnormal findings: Secondary | ICD-10-CM | POA: Diagnosis not present

## 2022-04-07 NOTE — Patient Instructions (Signed)
Casey Brennan , Thank you for taking time to come for your Medicare Wellness Visit. I appreciate your ongoing commitment to your health goals. Please review the following plan we discussed and let me know if I can assist you in the future.   These are the goals we discussed:  Goals      My goal is to eliminate my headaches.        This is a list of the screening recommended for you and due dates:  Health Maintenance  Topic Date Due   COVID-19 Vaccine (6 - Pfizer risk series) 06/03/2021   Tetanus Vaccine  07/11/2022*   Colon Cancer Screening  12/25/2028   Flu Shot  Completed   Hepatitis C Screening: USPSTF Recommendation to screen - Ages 18-79 yo.  Completed   HIV Screening  Completed   Zoster (Shingles) Vaccine  Completed   HPV Vaccine  Aged Out  *Topic was postponed. The date shown is not the original due date.    Advanced directives: Yes; documents on file.  Conditions/risks identified: Yes  Next appointment: Follow up in one year for your annual wellness visit.  Preventive Care 40-64 Years, Male Preventive care refers to lifestyle choices and visits with your health care provider that can promote health and wellness. What does preventive care include? A yearly physical exam. This is also called an annual well check. Dental exams once or twice a year. Routine eye exams. Ask your health care provider how often you should have your eyes checked. Personal lifestyle choices, including: Daily care of your teeth and gums. Regular physical activity. Eating a healthy diet. Avoiding tobacco and drug use. Limiting alcohol use. Practicing safe sex. Taking low-dose aspirin every day starting at age 81. What happens during an annual well check? The services and screenings done by your health care provider during your annual well check will depend on your age, overall health, lifestyle risk factors, and family history of disease. Counseling  Your health care provider may ask you  questions about your: Alcohol use. Tobacco use. Drug use. Emotional well-being. Home and relationship well-being. Sexual activity. Eating habits. Work and work Statistician. Screening  You may have the following tests or measurements: Height, weight, and BMI. Blood pressure. Lipid and cholesterol levels. These may be checked every 5 years, or more frequently if you are over 11 years old. Skin check. Lung cancer screening. You may have this screening every year starting at age 22 if you have a 30-pack-year history of smoking and currently smoke or have quit within the past 15 years. Fecal occult blood test (FOBT) of the stool. You may have this test every year starting at age 52. Flexible sigmoidoscopy or colonoscopy. You may have a sigmoidoscopy every 5 years or a colonoscopy every 10 years starting at age 85. Prostate cancer screening. Recommendations will vary depending on your family history and other risks. Hepatitis C blood test. Hepatitis B blood test. Sexually transmitted disease (STD) testing. Diabetes screening. This is done by checking your blood sugar (glucose) after you have not eaten for a while (fasting). You may have this done every 1-3 years. Discuss your test results, treatment options, and if necessary, the need for more tests with your health care provider. Vaccines  Your health care provider may recommend certain vaccines, such as: Influenza vaccine. This is recommended every year. Tetanus, diphtheria, and acellular pertussis (Tdap, Td) vaccine. You may need a Td booster every 10 years. Zoster vaccine. You may need this after age 60.  Pneumococcal 13-valent conjugate (PCV13) vaccine. You may need this if you have certain conditions and have not been vaccinated. Pneumococcal polysaccharide (PPSV23) vaccine. You may need one or two doses if you smoke cigarettes or if you have certain conditions. Talk to your health care provider about which screenings and vaccines you  need and how often you need them. This information is not intended to replace advice given to you by your health care provider. Make sure you discuss any questions you have with your health care provider. Document Released: 07/16/2015 Document Revised: 03/08/2016 Document Reviewed: 04/20/2015 Elsevier Interactive Patient Education  2017 [place] Prevention in the Home Falls can cause injuries. They can happen to people of all ages. There are many things you can do to make your home safe and to help prevent falls. What can I do on the outside of my home? Regularly fix the edges of walkways and driveways and fix any cracks. Remove anything that might make you trip as you walk through a door, such as a raised step or threshold. Trim any bushes or trees on the path to your home. Use bright outdoor lighting. Clear any walking paths of anything that might make someone trip, such as rocks or tools. Regularly check to see if handrails are loose or broken. Make sure that both sides of any steps have handrails. Any raised decks and porches should have guardrails on the edges. Have any leaves, snow, or ice cleared regularly. Use sand or salt on walking paths during winter. Clean up any spills in your garage right away. This includes oil or grease spills. What can I do in the bathroom? Use night lights. Install grab bars by the toilet and in the tub and shower. Do not use towel bars as grab bars. Use non-skid mats or decals in the tub or shower. If you need to sit down in the shower, use a plastic, non-slip stool. Keep the floor dry. Clean up any water that spills on the floor as soon as it happens. Remove soap buildup in the tub or shower regularly. Attach bath mats securely with double-sided non-slip rug tape. Do not have throw rugs and other things on the floor that can make you trip. What can I do in the bedroom? Use night lights. Make sure that you have a light by your bed that is  easy to reach. Do not use any sheets or blankets that are too big for your bed. They should not hang down onto the floor. Have a firm chair that has side arms. You can use this for support while you get dressed. Do not have throw rugs and other things on the floor that can make you trip. What can I do in the kitchen? Clean up any spills right away. Avoid walking on wet floors. Keep items that you use a lot in easy-to-reach places. If you need to reach something above you, use a strong step stool that has a grab bar. Keep electrical cords out of the way. Do not use floor polish or wax that makes floors slippery. If you must use wax, use non-skid floor wax. Do not have throw rugs and other things on the floor that can make you trip. What can I do with my stairs? Do not leave any items on the stairs. Make sure that there are handrails on both sides of the stairs and use them. Fix handrails that are broken or loose. Make sure that handrails are as long as the  stairways. Check any carpeting to make sure that it is firmly attached to the stairs. Fix any carpet that is loose or worn. Avoid having throw rugs at the top or bottom of the stairs. If you do have throw rugs, attach them to the floor with carpet tape. Make sure that you have a light switch at the top of the stairs and the bottom of the stairs. If you do not have them, ask someone to add them for you. What else can I do to help prevent falls? Wear shoes that: Do not have high heels. Have rubber bottoms. Are comfortable and fit you well. Are closed at the toe. Do not wear sandals. If you use a stepladder: Make sure that it is fully opened. Do not climb a closed stepladder. Make sure that both sides of the stepladder are locked into place. Ask someone to hold it for you, if possible. Clearly mark and make sure that you can see: Any grab bars or handrails. First and last steps. Where the edge of each step is. Use tools that help you  move around (mobility aids) if they are needed. These include: Canes. Walkers. Scooters. Crutches. Turn on the lights when you go into a dark area. Replace any light bulbs as soon as they burn out. Set up your furniture so you have a clear path. Avoid moving your furniture around. If any of your floors are uneven, fix them. If there are any pets around you, be aware of where they are. Review your medicines with your doctor. Some medicines can make you feel dizzy. This can increase your chance of falling. Ask your doctor what other things that you can do to help prevent falls. This information is not intended to replace advice given to you by your health care provider. Make sure you discuss any questions you have with your health care provider. Document Released: 04/15/2009 Document Revised: 11/25/2015 Document Reviewed: 07/24/2014 Elsevier Interactive Patient Education  2017 [employer].

## 2022-04-07 NOTE — Progress Notes (Signed)
Virtual Visit via Telephone Note  I connected with  Casey Brennan on 04/07/22 at  9:15 AM EDT by telephone and verified that I am speaking with the correct person using two identifiers.  Location: Patient: Home (Patient and POA) Provider: [place]  Persons participating in the virtual visit: [place]   I discussed the limitations, risks, security and privacy concerns of performing an evaluation and management service by telephone and the availability of in person appointments. The patient expressed understanding and agreed to proceed.  Interactive audio and video telecommunications were attempted between this nurse and patient, however failed, due to patient having technical difficulties OR patient did not have access to video capability.  We continued and completed visit with audio only.  Some vital signs may be absent or patient reported.   Sheral Flow, LPN  Subjective:   Casey Brennan is a 61 y.o. male who presents for Medicare Annual/Subsequent preventive examination.  Review of Systems     Cardiac Risk Factors include: advanced age (>42mn, >>28women);family history of premature cardiovascular disease;hypertension;male gender;obesity (BMI >30kg/m2)     Objective:    Today's Vitals   04/07/22 0927  Weight: 212 lb (96.2 kg)  Height: '5\' 10"'$  (1.778 m)  PainSc: 0-No pain   Body mass index is 30.42 kg/m.     04/07/2022    9:24 AM 02/25/2022    8:29 PM 06/23/2021    5:50 PM 06/23/2021    6:22 AM 06/20/2021    6:18 PM 04/05/2021    9:26 AM 12/18/2020    7:20 AM  Advanced Directives  Does Patient Have a Medical Advance Directive? Yes No Yes Yes Yes Yes No  Type of AP[place]amed[place]of [place] will  [place] will  [place] will    Does patient want to make changes to medical advance directive? No - Patient declined  No - Patient declined   No - Patient  declined   Copy of [place] Chart? Yes - validated most recent copy scanned in chart (See row information)        Would patient like information on creating a medical advance directive?       No - Patient declined    Current Medications (verified) Outpatient Encounter Medications as of 04/07/2022  Medication Sig   albuterol (PROVENTIL) (2.5 MG/3ML) 0.083% nebulizer solution Take 3 mLs (2.5 mg total) by nebulization every 6 (six) hours as needed for wheezing or shortness of breath.   albuterol (VENTOLIN HFA) 108 (90 Base) MCG/ACT inhaler Inhale 2 puffs into the lungs every 6 (six) hours as needed for wheezing or shortness of breath.   ALPRAZolam (XANAX) 0.5 MG tablet TAKE 1 TABLET(0.5 MG) BY MOUTH TWICE DAILY AS NEEDED FOR SLEEP   azithromycin (ZITHROMAX) 250 MG tablet Take 2 tablets on day 1, then 1 tablet daily on days 2 through 5   Beclomethasone Dipropionate (QNASL) 80 MCG/ACT AERS Place 2 sprays into the nose daily.   bictegravir-emtricitabine-tenofovir AF (BIKTARVY) 50-200-25 MG TABS tablet Take 1 tablet by mouth daily.   brimonidine (ALPHAGAN) 0.2 % ophthalmic solution Place 1 drop into the right eye 2 (two) times daily.   Budeson-Glycopyrrol-Formoterol (BREZTRI AEROSPHERE) 160-9-4.8 MCG/ACT AERO INHALE 2 PUFFS INTO THE LUNGS IN THE MORNING AND AT BEDTIME   cetirizine (ZYRTEC) 10 MG tablet Take 1 tablet (10 mg total) by mouth daily.   Cholecalciferol (VITAMIN D3) 250 MCG (10000 UT) capsule Take  1 tablet by mouth daily. Nature's Made   cyanocobalamin (,VITAMIN B-12,) 1000 MCG/ML injection Inject 1 mL (1,000 mcg total) into the muscle every 30 (thirty) days.   diphenhydrAMINE (BENADRYL) 25 MG tablet Take 1 tablet (25 mg total) by mouth every 6 (six) hours as needed. (Patient taking differently: Take 25 mg by mouth every 6 (six) hours as needed for sleep, itching or allergies.)   doxycycline (VIBRA-TABS) 100 MG tablet Take 1 tablet (100 mg total) by mouth 2 (two) times  daily.   EPINEPHrine 0.3 mg/0.3 mL IJ SOAJ injection Inject 0.3 mg into the muscle as needed for anaphylaxis.   gabapentin (NEURONTIN) 300 MG capsule Take by mouth 2 (two) times daily.   Glucos-Chond-Hyal Ac-Ca Fructo (MOVE FREE JOINT HEALTH ADVANCE PO) Take 1 tablet by mouth daily.   hydrALAZINE (APRESOLINE) 50 MG tablet Take 50 mg by mouth 2 (two) times daily.   hydrOXYzine (ATARAX) 50 MG tablet Take 1 tablet (50 mg total) by mouth 2 (two) times daily.   meclizine (ANTIVERT) 25 MG tablet Take 1 tablet (25 mg total) by mouth daily as needed.   methocarbamol (ROBAXIN) 500 MG tablet Take 500 mg by mouth 4 (four) times daily.   metoprolol succinate (TOPROL-XL) 100 MG 24 hr tablet Take 1 tablet (100 mg total) by mouth daily. Take with or immediately following a meal.   montelukast (SINGULAIR) 10 MG tablet Take 10 mg by mouth daily.   Multiple Vitamin (MULTIVITAMIN WITH MINERALS) TABS tablet Take 1 tablet by mouth daily after breakfast.    mupirocin ointment (BACTROBAN) 2 % APPLY EXTERNALLY TO THE AFFECTED AREA TWICE DAILY FOR 3 WEEKS, PLACE COTTON BETWEEN TOES TO ALLOW BETTER AERATION AS NEEDED (Patient taking differently: Apply 1 application  topically See admin instructions. APPLY EXTERNALLY TO THE AFFECTED AREA TWICE DAILY FOR 3 WEEKS, PLACE COTTON BETWEEN TOES TO ALLOW BETTER AERATION AS NEEDED)   nitroGLYCERIN (NITROSTAT) 0.4 MG SL tablet Place 1 tablet (0.4 mg total) under the tongue every 5 (five) minutes as needed for chest pain.   omeprazole (PRILOSEC) 40 MG capsule Take 1 capsule (40 mg total) by mouth 2 (two) times daily.   ondansetron (ZOFRAN-ODT) 4 MG disintegrating tablet Take 1 tablet (4 mg total) by mouth every 8 (eight) hours as needed for nausea or vomiting.   OVER THE COUNTER MEDICATION Take 2 capsules by mouth daily. Zhou: thyroid supplement   predniSONE (DELTASONE) 10 MG tablet 2 daily x 5 days, 1 daily x 5 days, then stop   rosuvastatin (CRESTOR) 20 MG tablet Take 1 tablet (20 mg  total) by mouth daily.   tiZANidine (ZANAFLEX) 4 MG tablet Take 2-4 mg by mouth 3 (three) times daily as needed.   tizanidine (ZANAFLEX) 6 MG capsule TAKE 1 CAPSULE(6 MG) BY MOUTH THREE TIMES DAILY   torsemide (DEMADEX) 20 MG tablet Take 10 mg by mouth See admin instructions. Tuesday Thursday Saturday   Ubrogepant (UBRELVY) 100 MG TABS Take 100 mg by mouth 2 (two) times daily as needed (can take a seecond dose 2 hiurs after first do not exceed 2 a day.).   [DISCONTINUED] zolpidem (AMBIEN) 5 MG tablet Take 1-2 tablets (5-10 mg total) by mouth at bedtime as needed for sleep. (Patient not taking: Reported on 04/23/2019)   No facility-administered encounter medications on file as of 04/07/2022.    Allergies (verified) Amoxapine, Chocolate, Ciprofloxacin, Descovy [emtricitabine-tenofovir af], Genvoya [elviteg-cobic-emtricit-tenofaf], Morphine and related, Oxycodone-acetaminophen, Penicillins, Sulfa antibiotics, Tomato, Vancomycin, Darunavir, Amlodipine, Carvedilol, Flagyl [metronidazole], Milk-related compounds, Morphine,  Penicillin g, Toradol [ketorolac tromethamine], Acetaminophen, and Tramadol   History: Past Medical History:  Diagnosis Date   Asthma    very rare   Chronic anemia    normocytic   Chronic folliculitis    Environmental and seasonal allergies    H/O pericarditis    2010--  myopercarditis--  resolved   Headache(784.0)    HX SEVERE FRONTAL HA'S   Hiatal hernia    History of gastric ulcer    History of kidney stones    History of MRSA infection 2010   infected boil   HIV (human immunodeficiency virus infection) ([place]) 1988   Hypertension    Lytic bone lesion of hip    WORK-UP DONE BY ONCOLOGIST DR HA --  NOT MALIGNANT   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   Pneumonia    hx of   Post concussion syndrome    resolved   Prostate cancer ([place]) 04/25/2013   gleason 3+3=6, volume 30 gm   Ulcer    hx of gastric   Past Surgical History:  Procedure Laterality Date   ACDCF   09/29/2020   ANTERIOR CERVICAL DECOMPRESSION/DISCECTOMY FUSION 4 LEVELS N/A 09/29/2020   Procedure: ANTERIOR CERVICAL DECOMPRESSION FUSION CERVICAL 3-CERVICAL 4 , CERVICAL 4 - CERVICAL 5, CERVICAL 5 - CERVICAL 6 WITH INSTRUMENTATION AND ALLOGRAFT;  Surgeon: Phylliss Bob, MD;  Location: [place];  Service: Orthopedics;  Laterality: N/A;   CARDIAC CATHETERIZATION  03-23-2009  DR COOPER   NORMAL CORONARY ARTERIES   CHOLECYSTECTOMY N/A 11/06/2014   Procedure: LAPAROSCOPIC CHOLECYSTECTOMY WITH INTRAOPERATIVE CHOLANGIOGRAM;  Surgeon: Judeth Horn, MD;  Location: [place];  Service: General;  Laterality: N/A;   COLONOSCOPY  12/26/2011   Procedure: COLONOSCOPY;  Surgeon: Lear Ng, MD;  Location: [facility];  Service: Endoscopy;  Laterality: N/A;   COLONOSCOPY     COLONOSCOPY WITH PROPOFOL N/A 07/29/2014   Procedure: COLONOSCOPY WITH PROPOFOL;  Surgeon: Lear Ng, MD;  Location: [place];  Service: Endoscopy;  Laterality: N/A;   CYSTOSCOPY N/A 05/18/2018   Procedure: CYSTOSCOPY REMOVAL FOREIGN BODY;  Surgeon: Kathie Rhodes, MD;  Location: [facility];  Service: Urology;  Laterality: N/A;   CYSTOSCOPY/RETROGRADE/URETEROSCOPY Bilateral 04/25/2013   Procedure: CYSTOSCOPY/ BILATERAL RETROGRADES; BLADDER BIOPSIES;  Surgeon: Alexis Frock, MD;  Location[facility];  Service: Urology;  Laterality: Bilateral;   DENTAL EXAMINATION UNDER ANESTHESIA     ESOPHAGOGASTRODUODENOSCOPY  12/26/2011   Procedure: ESOPHAGOGASTRODUODENOSCOPY (EGD);  Surgeon: Lear Ng, MD;  [facility];  Service: Endoscopy;  Laterality: N/A;   ESOPHAGOGASTRODUODENOSCOPY (EGD) WITH PROPOFOL N/A 07/29/2014   Procedure: ESOPHAGOGASTRODUODENOSCOPY (EGD) WITH PROPOFOL;  Surgeon: Lear Ng, MD;  Location: [place];  Service: Endoscopy;  Laterality: N/A;   EXCISION CHRONIC LEFT BREAST ABSCESS  09-21-2010   EXCISIONAL BX LEFT BREAST MASS/  I  &  D LEFT BREAST ABSCESS  03-24-2009   KNEE  ARTHROSCOPY Right 1985   left axilla biopsy  04/2013   left hip biopsy  10/2012   LYMPHADENECTOMY Bilateral 08/18/2013   Procedure: LYMPHADENECTOMY "PELVIC LYMPH NODE DISSECTION";  Surgeon: Alexis Frock, MD;  Location: [facility];  Service: Urology;  Laterality: Bilateral;   PROSTATE BIOPSY N/A 04/25/2013   Procedure: BIOPSY TRANSRECTAL ULTRASONIC PROSTATE (TUBP);  Surgeon: Alexis Frock, MD;  [facility];  Service: Urology;  Laterality: N/A;   ROBOT ASSISTED LAPAROSCOPIC RADICAL PROSTATECTOMY N/A 08/18/2013   Procedure: ROBOTIC ASSISTED LAPAROSCOPIC RADICAL PROSTATECTOMY;  Surgeon: Alexis Frock, MD;  Location[facility];  Service:  Urology;  Laterality: N/A;   TOTAL KNEE ARTHROPLASTY Right 08/01/2016   08/17/16   TOTAL KNEE ARTHROPLASTY Right 08/01/2016   Procedure: TOTAL KNEE ARTHROPLASTY;  Surgeon: Renette Butters, MD;  Location: [place];  Service: Orthopedics;  Laterality: Right;   UPPER GASTROINTESTINAL ENDOSCOPY     UPPER LEG SOFT TISSUE BIOPSY Left 2012   lthigh   Family History  Problem Relation Age of Onset   Stroke Father    Diabetes Father    Cancer Father        brain cancer   Asthma Father    Hypertension Sister    Cancer Maternal Uncle        prostate cancer   Asthma Sister    Colon cancer Neg Hx    Colon polyps Neg Hx    Esophageal cancer Neg Hx    Stomach cancer Neg Hx    Rectal cancer Neg Hx    Social History   Socioeconomic History   Marital status: Single    Spouse name: Not on file   Number of children: 1   Years of education: College   Highest education level: Not on file  Occupational History   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 declined condoms  Other Topics Concern   Not on file  Social History Narrative   Sister April stays with him currently although he hopes to return to truck driving. 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 Sister is starting medical school next year.   Patient is divorced and lives with his sister.   Patient has one adult son.   Patient is part-time, driving a truck.   Patient has a college education.   Patient is right-handed.   Patient drinks 2-3 cups of coffee and a 4-5 cups of tea.   Social Determinants of Health   Financial Resource Strain: Low Risk  (04/07/2022)   Overall Financial Resource Strain (CARDIA)    Difficulty of Paying Living Expenses: Not hard at all  Food Insecurity: No Food Insecurity (04/07/2022)   Hunger Vital Sign    Worried About Running Out of Food in the Last Year: Never true    Ran Out of Food in the Last Year: Never true  Transportation Needs: No Transportation Needs (04/07/2022)   PRAPARE - Hydrologist (Medical): No    Lack of Transportation (Non-Medical): No  Physical Activity: Sufficiently Active (04/07/2022)   Exercise Vital Sign    Days of Exercise per Week: 5 days    Minutes of Exercise per Session: 30 min  Stress: No Stress Concern Present (04/07/2022)   [place]    Feeling of Stress : Not at all  Social Connections: Socially Isolated (04/07/2022)   Social  Connection and Isolation Panel [NHANES]    Frequency of Communication with Friends and Family: More than three times a week    Frequency of Social Gatherings with Friends and Family: Once a week    Attends Religious Services: Never    Museum/gallery conservator or Organizations: No    Attends Music therapist: Never    Marital Status: Divorced    Tobacco Counseling Counseling given: Not Answered   Clinical Intake:  Pre-visit preparation completed: Yes  Pain : No/denies pain Pain Score: 0-No pain     BMI - recorded: 30.42 Nutritional Status: BMI > 30  Obese Nutritional Risks: None Diabetes: No  How often do you need to have someone help you when you read instructions, pamphlets, or other written materials from your doctor or pharmacy?: 1 - Never What is the last grade level you completed in school?: HSG; Veterinary Medicine Degree  Diabetic? no  Interpreter Needed?: No  Information entered by :: Lisette Abu, LPN.   Activities of Daily Living    04/07/2022    9:35 AM 01/25/2022    4:22 PM  In your present state of health, do you have any difficulty performing the following activities:  Hearing? 0 0  Vision? 0 0  Difficulty concentrating or making decisions? 0 0  Walking or climbing stairs? 0 0  Dressing or bathing? 0 0  Doing errands, shopping? 0 0  Preparing Food and eating ? N   Using the Toilet? N   In the past six months, have you accidently leaked urine? N   Do you have problems with loss of bowel control? N   Managing your Medications? N   Managing your Finances? N   Housekeeping or managing your Housekeeping? N     Patient Care Team: Biagio Borg, MD as PCP - General (Internal Medicine) Bjorn Loser, MD as Attending Physician (Urology) Wilford Corner, MD as Attending Physician (Gastroenterology) Rolm Bookbinder, MD as Attending Physician (General Surgery) Elmarie Shiley, MD (Nephrology) Nobie Putnam, MD (Hematology and Oncology) Jason Nest, [place] as Consulting Physician (Optometry) Bond, Tracie Harrier, MD as Referring Physician (Ophthalmology)  Indicate any recent Medical Services you may have received from other than [facility] providers in the past year (date may be  approximate).     Assessment:   This is a routine wellness examination for Casey Brennan.  Hearing/Vision screen Hearing Screening - Comments:: Denies hearing difficulties.  Vision Screening - Comments:: Wears rx glasses - up to date with routine eye exams with Raynelle Fanning, MD and Melene Muller, OD (both with [facility])   Dietary issues and exercise activities discussed: Current Exercise Habits: Home exercise routine, Type of exercise: walking, Time (Minutes): 30, Frequency (Times/Week): 5, Weekly Exercise (Minutes/Week): 150, Intensity: Moderate, Exercise limited by: respiratory conditions(s);orthopedic condition(s);neurologic condition(s)   Goals Addressed             This Visit's Progress    My goal is to eliminate my headaches.        Depression Screen    04/07/2022    9:30 AM 02/24/2022    4:19 PM 01/25/2022    4:26 PM 11/11/2021    1:10 PM 08/24/2021    3:21 PM 04/05/2021    9:29 AM 11/11/2020    2:51 PM  PHQ 2/9 Scores  PHQ - 2 Score 0 0 0 0 0 0 0  PHQ- 9 Score 0  0 0       Fall Risk    04/07/2022  9:34 AM 02/24/2022    4:19 PM 01/25/2022    3:44 PM 11/11/2021    1:10 PM 08/24/2021    3:21 PM  Fall Risk   Falls in the past year? 0  0 0 0  Number falls in past yr: 0 0 0 0 0  Injury with Fall? 0 0 0 0 0  Risk for fall due to : No Fall Risks  No Fall Risks    Follow up Falls prevention discussed  Falls evaluation completed      FALL RISK PREVENTION PERTAINING TO THE HOME:  Any stairs in or around the home? No  If so, are there any without handrails? No  Home free of loose throw rugs in walkways, pet beds, electrical cords, etc? Yes  Adequate lighting in your home to reduce risk of falls? Yes   ASSISTIVE DEVICES UTILIZED TO PREVENT FALLS:  Life alert? No  Use of a cane, walker or w/c? No  Grab bars in the bathroom? Yes  Shower chair or bench in shower? Yes  Elevated toilet seat or a handicapped toilet? Yes   TIMED UP AND GO:  Was the test performed? No . Phone  Visit  Cognitive Function:        04/07/2022    9:37 AM  6CIT Screen  What Year? 0 points  What month? 0 points  What time? 0 points  Count back from 20 0 points  Months in reverse 0 points  Repeat phrase 0 points  Total Score 0 points    Immunizations Immunization History  Administered Date(s) Administered   Hepatitis A 06/12/2011   Influenza Split 06/12/2012   Influenza Whole 04/19/2009, 07/25/2010, 02/22/2011   Influenza,inj,Quad PF,6+ Mos 06/04/2013, 04/27/2014, 07/21/2015, 05/22/2016, 04/27/2017, 05/20/2020, 04/06/2022   Influenza-Unspecified 01/30/2015   Meningococcal Mcv4o 05/22/2016   PFIZER Comirnaty(Gray Top)Covid-19 Tri-Sucrose Vaccine 05/25/2020   PFIZER(Purple Top)SARS-COV-2 Vaccination 10/23/2019, 11/13/2019, 05/25/2020   PPD Test 05/05/2009   Pfizer Covid-19 Vaccine Bivalent Booster 18yr & up 04/08/2021   Pneumococcal Conjugate-13 11/11/2020   Pneumococcal Polysaccharide-23 03/22/2009, 08/19/2013, 11/15/2020   Tdap 06/12/2011   Zoster Recombinat (Shingrix) 12/27/2020, 04/06/2022    TDAP status: Due, Education has been provided regarding the importance of this vaccine. Advised may receive this vaccine at local pharmacy or Health Dept. Aware to provide a copy of the vaccination record if obtained from local pharmacy or Health Dept. Verbalized acceptance and understanding.  Flu Vaccine status: Up to date  Pneumococcal vaccine status: Up to date  Covid-19 vaccine status: Completed vaccines  Qualifies for Shingles Vaccine? Yes   Zostavax completed No   Shingrix Completed?: Yes  Screening Tests Health Maintenance  Topic Date Due   COVID-19 Vaccine (6 - Pfizer risk series) 06/03/2021   TETANUS/TDAP  07/11/2022 (Originally 06/11/2021)   COLONOSCOPY (Pts 45-425yrInsurance coverage will need to be confirmed)  12/25/2028   INFLUENZA VACCINE  Completed   Hepatitis C Screening  Completed   HIV Screening  Completed   Zoster Vaccines- Shingrix  Completed    HPV VACCINES  Aged Out    Health Maintenance  Health Maintenance Due  Topic Date Due   COVID-19 Vaccine (6 - Pfizer risk series) 06/03/2021    Colorectal cancer screening: Type of screening: Colonoscopy. Completed 12/26/2018. Repeat every 10 years  Lung Cancer Screening: (Low Dose CT Chest recommended if Age 59-80ears, 30 pack-year currently smoking OR have quit w/in 15years.) does not qualify.   Lung Cancer Screening Referral: no  Additional Screening:  Hepatitis C  Screening: does qualify; Completed 04/19/2009  Vision Screening: Recommended annual ophthalmology exams for early detection of glaucoma and other disorders of the eye. Is the patient up to date with their annual eye exam?  Yes  Who is the provider or what is the name of the office in which the patient attends annual eye exams? Raynelle Fanning, MD and Melene Muller, OD. If pt is not established with a provider, would they like to be referred to a provider to establish care? No .   Dental Screening: Recommended annual dental exams for proper oral hygiene  Community Resource Referral / Chronic Care Management: CRR required this visit?  No   CCM required this visit?  No      Plan:     I have personally reviewed and noted the following in the patient's chart:   Medical and social history Use of alcohol, tobacco or illicit drugs  Current medications and supplements including opioid prescriptions. Patient is not currently taking opioid prescriptions. Functional ability and status Nutritional status Physical activity Advanced directives List of other physicians Hospitalizations, surgeries, and ER visits in previous 12 months Vitals Screenings to include cognitive, depression, and falls Referrals and appointments  In addition, I have reviewed and discussed with patient certain preventive protocols, quality metrics, and best practice recommendations. A written personalized care plan for preventive services as well  as general preventive health recommendations were provided to patient.     Sheral Flow, LPN   26/10/1581   Nurse Notes: N/A

## 2022-04-13 ENCOUNTER — Ambulatory Visit: Payer: Medicare Other | Admitting: Cardiology

## 2022-04-14 DIAGNOSIS — M5412 Radiculopathy, cervical region: Secondary | ICD-10-CM | POA: Diagnosis not present

## 2022-04-17 DIAGNOSIS — H401114 Primary open-angle glaucoma, right eye, indeterminate stage: Secondary | ICD-10-CM | POA: Diagnosis not present

## 2022-04-17 DIAGNOSIS — H40022 Open angle with borderline findings, high risk, left eye: Secondary | ICD-10-CM | POA: Diagnosis not present

## 2022-04-27 ENCOUNTER — Ambulatory Visit: Payer: PRIVATE HEALTH INSURANCE | Admitting: Pulmonary Disease

## 2022-05-12 ENCOUNTER — Ambulatory Visit: Payer: Medicare Other | Admitting: Cardiology

## 2022-05-18 ENCOUNTER — Ambulatory Visit: Payer: PRIVATE HEALTH INSURANCE | Admitting: Internal Medicine

## 2022-06-06 ENCOUNTER — Other Ambulatory Visit: Payer: Self-pay | Admitting: Neurology

## 2022-06-06 ENCOUNTER — Other Ambulatory Visit: Payer: Self-pay | Admitting: Cardiology

## 2022-06-06 DIAGNOSIS — G4700 Insomnia, unspecified: Secondary | ICD-10-CM

## 2022-06-09 ENCOUNTER — Ambulatory Visit: Payer: PRIVATE HEALTH INSURANCE | Admitting: Internal Medicine

## 2022-06-09 ENCOUNTER — Ambulatory Visit: Payer: PRIVATE HEALTH INSURANCE | Admitting: Family Medicine

## 2022-06-09 DIAGNOSIS — R059 Cough, unspecified: Secondary | ICD-10-CM | POA: Diagnosis not present

## 2022-06-12 ENCOUNTER — Encounter: Payer: Self-pay | Admitting: Internal Medicine

## 2022-06-14 ENCOUNTER — Encounter: Payer: Self-pay | Admitting: Internal Medicine

## 2022-06-14 ENCOUNTER — Ambulatory Visit (INDEPENDENT_AMBULATORY_CARE_PROVIDER_SITE_OTHER): Payer: PRIVATE HEALTH INSURANCE

## 2022-06-14 ENCOUNTER — Ambulatory Visit (INDEPENDENT_AMBULATORY_CARE_PROVIDER_SITE_OTHER): Payer: PRIVATE HEALTH INSURANCE | Admitting: Internal Medicine

## 2022-06-14 VITALS — BP 122/76 | HR 64 | Temp 98.4°F | Ht 70.0 in | Wt 116.0 lb

## 2022-06-14 DIAGNOSIS — J441 Chronic obstructive pulmonary disease with (acute) exacerbation: Secondary | ICD-10-CM

## 2022-06-14 DIAGNOSIS — I10 Essential (primary) hypertension: Secondary | ICD-10-CM | POA: Diagnosis not present

## 2022-06-14 DIAGNOSIS — K219 Gastro-esophageal reflux disease without esophagitis: Secondary | ICD-10-CM

## 2022-06-14 DIAGNOSIS — Z23 Encounter for immunization: Secondary | ICD-10-CM

## 2022-06-14 DIAGNOSIS — R079 Chest pain, unspecified: Secondary | ICD-10-CM | POA: Diagnosis not present

## 2022-06-14 LAB — POC SOFIA SARS ANTIGEN FIA: SARS Coronavirus 2 Ag: NEGATIVE

## 2022-06-14 LAB — POC INFLUENZA A&B (BINAX/QUICKVUE)
Influenza A, POC: NEGATIVE
Influenza B, POC: NEGATIVE

## 2022-06-14 LAB — POCT RESPIRATORY SYNCYTIAL VIRUS: RSV Rapid Ag: NEGATIVE

## 2022-06-14 MED ORDER — HYDROCOD POLI-CHLORPHE POLI ER 10-8 MG/5ML PO SUER: 5.0000 mL | Freq: Two times a day (BID) | ORAL | 0 refills | Status: DC | PRN

## 2022-06-14 MED ORDER — PREDNISONE 10 MG PO TABS: ORAL_TABLET | ORAL | 0 refills | Status: DC

## 2022-06-14 MED ORDER — PANTOPRAZOLE SODIUM 40 MG PO TBEC: 40.0000 mg | DELAYED_RELEASE_TABLET | Freq: Every day | ORAL | 3 refills | Status: DC

## 2022-06-14 MED ORDER — CEPHALEXIN 500 MG PO CAPS: 500.0000 mg | ORAL_CAPSULE | Freq: Three times a day (TID) | ORAL | 0 refills | Status: DC

## 2022-06-14 NOTE — Patient Instructions (Addendum)
You had the Shingles Shot #2 today  Your COVID, Flu and RSV testing was done today  Ok to stop the doxycycline, but continue your inhaler as needed  Please take all new medication as prescribed - the new antibiotic, cough medicine refill, and short course of prednisone  Please continue all other medications as before  Please have the pharmacy call with any other refills you may need.  Please continue your efforts at being more active, low cholesterol diet, and weight control.  Please keep your appointments with your specialists as you may have planned  Please go to the XRAY Department in the first floor for the x-ray testing  You will be contacted by phone if any changes need to be made immediately.  Otherwise, you will receive a letter about your results with an explanation, but please check with MyChart first.  Please remember to sign up for MyChart if you have not done so, as this will be important to you in the future with finding out test results, communicating by private email, and scheduling acute appointments online when needed.  Please make an Appointment to return in 4 months, or sooner if needed

## 2022-06-14 NOTE — Progress Notes (Signed)
Patient ID: Casey Brennan, male   DOB: Nov 17, 1960, 61 y.o.   MRN: [id number]        Chief Complaint: follow up after seen at [facility] [address] with cough illness with wheezing, possible CAP but neg cxr per EMR results today       HPI:  COAL NEARHOOD is a 61 y.o. male here with c/o above x 1 wk with persistent cough, weakness, now some worse overall with left sided pleuritic cp and HA.  No fever, had some chills.  Still with mild wheezing, inhaler helps.  Former smoker cigars over 10-12 yrs, hx of copd.  Pt denies orthopnea, PND, increased LE swelling, palpitations, dizziness or syncope.   Pt denies polydipsia, polyuria, or new focal neuro s/s.    Pt denies fever, wt loss, night sweats, loss of appetite, or other constitutional symptoms  Also with mild worsening reflux x 2 wks despite omeprazole, but no other abd pain, dysphagia, n/v, bowel change or blood.       Wt Readings from Last 3 Encounters:  06/14/22 116 lb (52.6 kg)  04/07/22 212 lb (96.2 kg)  04/06/22 212 lb (96.2 kg)   BP Readings from Last 3 Encounters:  06/14/22 122/76  04/06/22 132/78  03/02/22 128/72         Past Medical History:  Diagnosis Date   Asthma    very rare   Chronic anemia    normocytic   Chronic folliculitis    Environmental and seasonal allergies    H/O pericarditis    2010--  myopercarditis--  resolved   Headache(784.0)    HX SEVERE FRONTAL HA'S   Hiatal hernia    History of gastric ulcer    History of kidney stones    History of MRSA infection 2010   infected boil   HIV (human immunodeficiency virus infection) ([place]) 1988   Hypertension    Lytic bone lesion of hip    WORK-UP DONE BY ONCOLOGIST DR HA --  NOT MALIGNANT   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   Pneumonia    hx of   Post concussion syndrome    resolved   Prostate cancer ([place]) 04/25/2013   gleason 3+3=6, volume 30 gm   Ulcer    hx of gastric   Past Surgical History:  Procedure Laterality Date   ACDCF  09/29/2020    ANTERIOR CERVICAL DECOMPRESSION/DISCECTOMY FUSION 4 LEVELS N/A 09/29/2020   Procedure: ANTERIOR CERVICAL DECOMPRESSION FUSION CERVICAL 3-CERVICAL 4 , CERVICAL 4 - CERVICAL 5, CERVICAL 5 - CERVICAL 6 WITH INSTRUMENTATION AND ALLOGRAFT;  Surgeon: Phylliss Bob, MD;  Location: [facility];  Service: Orthopedics;  Laterality: N/A;   CARDIAC CATHETERIZATION  03-23-2009  DR COOPER   NORMAL CORONARY ARTERIES   CHOLECYSTECTOMY N/A 11/06/2014   Procedure: LAPAROSCOPIC CHOLECYSTECTOMY WITH INTRAOPERATIVE CHOLANGIOGRAM;  Surgeon: Judeth Horn, MD;  Location: [place];  Service: General;  Laterality: N/A;   COLONOSCOPY  12/26/2011   Procedure: COLONOSCOPY;  Surgeon: Lear Ng, MD;  Location: [facility];  Service: Endoscopy;  Laterality: N/A;   COLONOSCOPY     COLONOSCOPY WITH PROPOFOL N/A 07/29/2014   Procedure: COLONOSCOPY WITH PROPOFOL;  Surgeon: Lear Ng, MD;  Location: [place];  Service: Endoscopy;  Laterality: N/A;   CYSTOSCOPY N/A 05/18/2018   Procedure: CYSTOSCOPY REMOVAL FOREIGN BODY;  Surgeon: Kathie Rhodes, MD;  Location: [facility];  Service: Urology;  Laterality: N/A;   CYSTOSCOPY/RETROGRADE/URETEROSCOPY Bilateral 04/25/2013   Procedure: CYSTOSCOPY/ BILATERAL RETROGRADES; BLADDER BIOPSIES;  Surgeon:  Alexis Frock, MD;  Location[facility];  Service: Urology;  Laterality: Bilateral;   DENTAL EXAMINATION UNDER ANESTHESIA     ESOPHAGOGASTRODUODENOSCOPY  12/26/2011   Procedure: ESOPHAGOGASTRODUODENOSCOPY (EGD);  Surgeon: Lear Ng, MD;  Location: [facility];  Service: Endoscopy;  Laterality: N/A;   ESOPHAGOGASTRODUODENOSCOPY (EGD) WITH PROPOFOL N/A 07/29/2014   Procedure: ESOPHAGOGASTRODUODENOSCOPY (EGD) WITH PROPOFOL;  Surgeon: Lear Ng, MD;  Location: [place];  Service: Endoscopy;  Laterality: N/A;   EXCISION CHRONIC LEFT BREAST ABSCESS  09-21-2010   EXCISIONAL BX LEFT BREAST MASS/  I  &  D LEFT BREAST ABSCESS  03-24-2009   KNEE ARTHROSCOPY  Right 1985   left axilla biopsy  04/2013   left hip biopsy  10/2012   LYMPHADENECTOMY Bilateral 08/18/2013   Procedure: LYMPHADENECTOMY "PELVIC LYMPH NODE DISSECTION";  Surgeon: Alexis Frock, MD;  Location: [facility];  Service: Urology;  Laterality: Bilateral;   PROSTATE BIOPSY N/A 04/25/2013   Procedure: BIOPSY TRANSRECTAL ULTRASONIC PROSTATE (TUBP);  Surgeon: Alexis Frock, MD;  Location: [facility];  Service: Urology;  Laterality: N/A;   ROBOT ASSISTED LAPAROSCOPIC RADICAL PROSTATECTOMY N/A 08/18/2013   Procedure: ROBOTIC ASSISTED LAPAROSCOPIC RADICAL PROSTATECTOMY;  Surgeon: Alexis Frock, MD;  Location: [facility];  Service: Urology;  Laterality: N/A;   TOTAL KNEE ARTHROPLASTY Right 08/01/2016   08/17/16   TOTAL KNEE ARTHROPLASTY Right 08/01/2016   Procedure: TOTAL KNEE ARTHROPLASTY;  Surgeon: Renette Butters, MD;  Location: [place];  Service: Orthopedics;  Laterality: Right;   UPPER GASTROINTESTINAL ENDOSCOPY     UPPER LEG SOFT TISSUE BIOPSY Left 2012   lthigh    reports that he quit smoking about 12 years ago. His smoking use included cigars and cigarettes. He has a 34.50 pack-year smoking history. He has never used smokeless tobacco. He reports that he does not currently use alcohol. He reports that he does not use drugs. family history includes Asthma in his father and sister; Cancer in his father and maternal uncle; Diabetes in his father; Hypertension in his sister; Stroke in his father. Allergies  Allergen Reactions   Amoxapine Shortness Of Breath   Chocolate Shortness Of Breath and Other (See Comments)    Asthma attack, welts   Ciprofloxacin Anaphylaxis, Dermatitis, Itching, Nausea And Vomiting, Shortness Of Breath, Swelling and Hives   Descovy [Emtricitabine-Tenofovir Af] Shortness Of Breath    Nasuea, vomiiting, diarrhea, sob, whelps, throat closes   Genvoya [Elviteg-Cobic-Emtricit-Tenofaf] Shortness Of Breath    Nausea, vomitting, diarreha, sob, rash, throat closing    Morphine And Related Shortness Of Breath, Itching and Other (See Comments)     chest pain   Other Anaphylaxis   Oxycodone-Acetaminophen Shortness Of Breath   Penicillins Shortness Of Breath and Rash    Has patient had a PCN reaction causing immediate rash, facial/tongue/throat swelling, SOB or lightheadedness with hypotension: Yes Has patient had a PCN reaction causing severe rash involving mucus membranes or skin necrosis: No Has patient had a PCN reaction that required hospitalization No Has patient had a PCN reaction occurring within the last 10 years: Yes If all of the above answers are "NO", then may proceed with Cephalosporin use.   Sulfa Antibiotics Anaphylaxis and Other (See Comments)   Tomato Shortness Of Breath   Vancomycin Shortness Of Breath and Other (See Comments)   Darunavir Itching   Amlodipine Other (See Comments)    swelling   Carvedilol     LEG SWELLING, DIZZNESS   Flagyl [Metronidazole] Itching   Milk-Related Compounds  Morphine Other (See Comments)   Penicillin G Other (See Comments)   Toradol [Ketorolac Tromethamine] Nausea And Vomiting    itching   Acetaminophen Rash and Other (See Comments)    Pt tolerates percocet and also plain APAP without rash   Tramadol Itching, Nausea And Vomiting and Other (See Comments)   Current Outpatient Medications on File Prior to Visit  Medication Sig Dispense Refill   albuterol (PROVENTIL) (2.5 MG/3ML) 0.083% nebulizer solution Take 3 mLs (2.5 mg total) by nebulization every 6 (six) hours as needed for wheezing or shortness of breath. 75 mL 3   albuterol (VENTOLIN HFA) 108 (90 Base) MCG/ACT inhaler Inhale 2 puffs into the lungs every 6 (six) hours as needed for wheezing or shortness of breath. 36 g 3   ALPRAZolam (XANAX) 0.5 MG tablet TAKE 1 TABLET(0.5 MG) BY MOUTH TWICE DAILY AS NEEDED FOR SLEEP 60 tablet 2   Beclomethasone Dipropionate (QNASL) 80 MCG/ACT AERS Place 2 sprays into the nose daily. 10.6 g 2    bictegravir-emtricitabine-tenofovir AF (BIKTARVY) 50-200-25 MG TABS tablet Take 1 tablet by mouth daily. 30 tablet 11   Budeson-Glycopyrrol-Formoterol (BREZTRI AEROSPHERE) 160-9-4.8 MCG/ACT AERO INHALE 2 PUFFS INTO THE LUNGS IN THE MORNING AND AT BEDTIME 10.7 g 0   cetirizine (ZYRTEC) 10 MG tablet Take 1 tablet (10 mg total) by mouth daily. 90 tablet 3   Cholecalciferol (VITAMIN D3) 250 MCG (10000 UT) capsule Take 1 tablet by mouth daily. Nature's Made     cyanocobalamin (,VITAMIN B-12,) 1000 MCG/ML injection Inject 1 mL (1,000 mcg total) into the muscle every 30 (thirty) days. 3 mL 3   diphenhydrAMINE (BENADRYL) 25 MG tablet Take 1 tablet (25 mg total) by mouth every 6 (six) hours as needed. (Patient taking differently: Take 25 mg by mouth every 6 (six) hours as needed for sleep, itching or allergies.) 30 tablet 0   dorzolamide (TRUSOPT) 2 % ophthalmic solution 1 drop 2 (two) times daily.     doxycycline (VIBRA-TABS) 100 MG tablet Take 1 tablet (100 mg total) by mouth 2 (two) times daily. 14 tablet 0   EPINEPHrine 0.3 mg/0.3 mL IJ SOAJ injection Inject 0.3 mg into the muscle as needed for anaphylaxis. 1 each 5   gabapentin (NEURONTIN) 300 MG capsule Take by mouth 2 (two) times daily.     Glucos-Chond-Hyal Ac-Ca Fructo (MOVE FREE JOINT HEALTH ADVANCE PO) Take 1 tablet by mouth daily.     hydrALAZINE (APRESOLINE) 50 MG tablet Take 50 mg by mouth 2 (two) times daily.     HYDROcodone-acetaminophen (NORCO/VICODIN) 5-325 MG tablet Take 1 tablet by mouth every 6 (six) hours as needed.     hydrOXYzine (ATARAX) 50 MG tablet Take 1 tablet (50 mg total) by mouth 2 (two) times daily. 60 tablet 1   meclizine (ANTIVERT) 25 MG tablet Take 1 tablet (25 mg total) by mouth daily as needed. 40 tablet 5   methocarbamol (ROBAXIN) 500 MG tablet Take 500 mg by mouth 4 (four) times daily.     metoprolol succinate (TOPROL-XL) 100 MG 24 hr tablet TAKE 1 TABLET(100 MG) BY MOUTH DAILY WITH OR IMMEDIATELY FOLLOWING A MEAL 90  tablet 3   montelukast (SINGULAIR) 10 MG tablet Take 10 mg by mouth daily.     Multiple Vitamin (MULTIVITAMIN WITH MINERALS) TABS tablet Take 1 tablet by mouth daily after breakfast.      mupirocin ointment (BACTROBAN) 2 % APPLY EXTERNALLY TO THE AFFECTED AREA TWICE DAILY FOR 3 WEEKS, PLACE COTTON BETWEEN TOES TO ALLOW  BETTER AERATION AS NEEDED (Patient taking differently: Apply 1 application  topically See admin instructions. APPLY EXTERNALLY TO THE AFFECTED AREA TWICE DAILY FOR 3 WEEKS, PLACE COTTON BETWEEN TOES TO ALLOW BETTER AERATION AS NEEDED) 66 g 1   ondansetron (ZOFRAN-ODT) 4 MG disintegrating tablet Take 1 tablet (4 mg total) by mouth every 8 (eight) hours as needed for nausea or vomiting. 30 tablet 2   OVER THE COUNTER MEDICATION Take 2 capsules by mouth daily. Zhou: thyroid supplement     rosuvastatin (CRESTOR) 20 MG tablet Take 1 tablet (20 mg total) by mouth daily. 90 tablet 3   tiZANidine (ZANAFLEX) 4 MG tablet Take 2-4 mg by mouth 3 (three) times daily as needed.     tizanidine (ZANAFLEX) 6 MG capsule TAKE 1 CAPSULE(6 MG) BY MOUTH THREE TIMES DAILY 90 capsule 11   torsemide (DEMADEX) 20 MG tablet Take 10 mg by mouth See admin instructions. Tuesday Thursday Saturday     Ubrogepant (UBRELVY) 100 MG TABS Take 100 mg by mouth 2 (two) times daily as needed (can take a seecond dose 2 hiurs after first do not exceed 2 a day.). 10 tablet 5   brimonidine (ALPHAGAN) 0.2 % ophthalmic solution Place 1 drop into the right eye 2 (two) times daily. (Patient not taking: Reported on 06/14/2022)     nitroGLYCERIN (NITROSTAT) 0.4 MG SL tablet Place 1 tablet (0.4 mg total) under the tongue every 5 (five) minutes as needed for chest pain. 30 tablet 1   [DISCONTINUED] zolpidem (AMBIEN) 5 MG tablet Take 1-2 tablets (5-10 mg total) by mouth at bedtime as needed for sleep. (Patient not taking: Reported on 04/23/2019) 30 tablet 2   No current facility-administered medications on file prior to visit.         ROS:  All others reviewed and negative.  Objective        PE:  BP 122/76 (BP Location: Right Arm, Patient Position: Sitting, Cuff Size: Large)   Pulse 64   Temp 98.4 F (36.9 C) (Oral)   Ht '5\' 10"'$  (1.778 m)   Wt 116 lb (52.6 kg)   SpO2 94%   BMI 16.64 kg/m                 Constitutional: Pt appears in NAD, fatigued, mild ill appearing               HENT: Head: NCAT.                Right Ear: External ear normal.                 Left Ear: External ear normal. Bilat tm's with mild erythema.  Max sinus areas non tender.  Pharynx with mild erythema, no exudate               Eyes: . Pupils are equal, round, and reactive to light. Conjunctivae and EOM are normal               Nose: without d/c or deformity               Neck: Neck supple. Gross normal ROM               Cardiovascular: Normal rate and regular rhythm.                 Pulmonary/Chest: Effort normal and breath sounds without rales but with few bilateral wheezing.  Abd:  Soft, mild epigastric tender, ND, + BS, no organomegaly               Neurological: Pt is alert. At baseline orientation, motor grossly intact               Skin: Skin is warm. No rashes, no other new lesions, LE edema - none               Psychiatric: Pt behavior is normal without agitation   Micro: none  Cardiac tracings I have personally interpreted today:  none  Pertinent Radiological findings (summarize): none   Lab Results  Component Value Date   WBC 5.4 02/25/2022   HGB 12.1 (L) 02/25/2022   HCT 36.9 (L) 02/25/2022   PLT 184 02/25/2022   GLUCOSE 110 (H) 02/25/2022   CHOL 155 08/24/2021   TRIG 98.0 08/24/2021   HDL 46.40 08/24/2021   LDLCALC 89 08/24/2021   ALT 16 01/25/2022   AST 17 01/25/2022   NA 140 02/25/2022   K 4.1 02/25/2022   CL 110 02/25/2022   CREATININE 1.19 02/25/2022   BUN 10 02/25/2022   CO2 25 02/25/2022   TSH 1.48 08/24/2021   PSA 0.00 (L) 08/24/2021   INR 1.0 06/23/2021   HGBA1C <4.0 08/25/2021    MICROALBUR 16.88 (H) 10/05/2011   POCT today - COVID - neg, RSV - neg, Flu A/B - neg  Assessment/Plan:  AHMAAD NEIDHARDT is a 61 y.o. 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  GERD (gastroesophageal reflux disease) Uncontrolled, ok to change PPI to protonix 4 mg qd, consider add pepcid  COPD exacerbation (HCC) Mild to mod persistent, covid/flu/RSV neg, now for antibx course- levaquin 500 mg qd x 10 days, cough med prn, prednisone, continue inhaler prn,  to f/u any worsening symptoms or concerns   Essential hypertension BP Readings from Last 3 Encounters:  06/14/22 122/76  04/06/22 132/78  03/02/22 128/72   Stable, pt to continue medical treatment hydralazine 50 bid, toprol xl 100 qd  Followup: Return in about 4 months (around 10/14/2022).  Cathlean Cower, MD 06/17/2022 2:50 PM [facility]

## 2022-06-15 ENCOUNTER — Telehealth: Payer: Self-pay | Admitting: *Deleted

## 2022-06-15 ENCOUNTER — Encounter: Payer: Self-pay | Admitting: *Deleted

## 2022-06-15 ENCOUNTER — Telehealth: Payer: Self-pay | Admitting: Neurology

## 2022-06-15 DIAGNOSIS — M542 Cervicalgia: Secondary | ICD-10-CM | POA: Diagnosis not present

## 2022-06-15 DIAGNOSIS — M4322 Fusion of spine, cervical region: Secondary | ICD-10-CM | POA: Diagnosis not present

## 2022-06-15 DIAGNOSIS — M5412 Radiculopathy, cervical region: Secondary | ICD-10-CM | POA: Diagnosis not present

## 2022-06-15 NOTE — Telephone Encounter (Signed)
Called back and spoke with sister April (POA). Relayed Dr. Rexene Alberts ok with providing letter and ready for pick up. She verbalized understanding and on her way back to pick up. I placed up front.

## 2022-06-15 NOTE — Telephone Encounter (Signed)
Pt stopped in office today. 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 He needs specifically: "Letter stating sleep apnea does not impede on his driving abilities". Aware Dr. Brett Fairy is out and will send to covering MD, Dr. Rexene Alberts to review.    Per notes from Dr. Brett Fairy re sleep study 07/20/21:

## 2022-06-15 NOTE — Telephone Encounter (Signed)
Pt came in and stated that they spoke with Medicaid and verified that he does have Medicaid. They're asking that we resubmit so he no longer has a balance with [place].

## 2022-06-15 NOTE — Telephone Encounter (Signed)
Okay to furnish a letter of support on behalf of Dr. Brett Fairy, I would recommend that she review upon her return to make any adjustments to this recommendation below:    <<Patient had a home sleep test in January 2023 which indicated overall mild obstructive sleep apnea with only mild desaturations.  Treatment with a positive airway pressure device is optional and not imperative.  From the sleep apnea standpoint, the patient has no driving restriction.>>

## 2022-06-17 ENCOUNTER — Encounter: Payer: Self-pay | Admitting: Internal Medicine

## 2022-06-17 NOTE — Assessment & Plan Note (Signed)
Mild to mod persistent, covid/flu/RSV neg, now for antibx course- levaquin 500 mg qd x 10 days, cough med prn, prednisone, continue inhaler prn,  to f/u any worsening symptoms or concerns

## 2022-06-17 NOTE — Assessment & Plan Note (Signed)
BP Readings from Last 3 Encounters:  06/14/22 122/76  04/06/22 132/78  03/02/22 128/72   Stable, pt to continue medical treatment hydralazine 50 bid, toprol xl 100 qd

## 2022-06-17 NOTE — Assessment & Plan Note (Signed)
Uncontrolled, ok to change PPI to protonix 4 mg qd, consider add pepcid

## 2022-06-27 ENCOUNTER — Other Ambulatory Visit: Payer: Self-pay | Admitting: Infectious Diseases

## 2022-06-27 ENCOUNTER — Other Ambulatory Visit: Payer: Self-pay | Admitting: Neurology

## 2022-06-27 ENCOUNTER — Other Ambulatory Visit: Payer: Self-pay | Admitting: Pulmonary Disease

## 2022-06-27 DIAGNOSIS — J449 Chronic obstructive pulmonary disease, unspecified: Secondary | ICD-10-CM

## 2022-07-13 ENCOUNTER — Ambulatory Visit: Payer: 59 | Admitting: Family Medicine

## 2022-07-14 DIAGNOSIS — M5412 Radiculopathy, cervical region: Secondary | ICD-10-CM | POA: Diagnosis not present

## 2022-07-16 ENCOUNTER — Encounter: Payer: Self-pay | Admitting: Internal Medicine

## 2022-07-16 NOTE — Progress Notes (Unsigned)
Subjective:    Patient ID: Casey Brennan, male    DOB: 11/20/1960, 62 y.o.   MRN: [id number]      HPI Casey Brennan is here for No chief complaint on file.    Food poisoning one week ago -     Medications and allergies reviewed with patient and updated if appropriate.  Current Outpatient Medications on File Prior to Visit  Medication Sig Dispense Refill   albuterol (PROVENTIL) (2.5 MG/3ML) 0.083% nebulizer solution Take 3 mLs (2.5 mg total) by nebulization every 6 (six) hours as needed for wheezing or shortness of breath. 75 mL 3   ALPRAZolam (XANAX) 0.5 MG tablet TAKE 1 TABLET(0.5 MG) BY MOUTH TWICE DAILY AS NEEDED FOR SLEEP 60 tablet 2   bictegravir-emtricitabine-tenofovir AF (BIKTARVY) 50-200-25 MG TABS tablet Take 1 tablet by mouth daily. 30 tablet 11   BREZTRI AEROSPHERE 160-9-4.8 MCG/ACT AERO INHALE 2 PUFFS INTO THE LUNGS IN THE MORNING AND AT BEDTIME 10.7 g 0   brimonidine (ALPHAGAN) 0.2 % ophthalmic solution Place 1 drop into the right eye 2 (two) times daily. (Patient not taking: Reported on 06/14/2022)     cephALEXin (KEFLEX) 500 MG capsule Take 1 capsule (500 mg total) by mouth 3 (three) times daily. 30 capsule 0   cetirizine (ZYRTEC) 10 MG tablet Take 1 tablet (10 mg total) by mouth daily. 90 tablet 3   chlorpheniramine-HYDROcodone (TUSSIONEX) 10-8 MG/5ML Take 5 mLs by mouth every 12 (twelve) hours as needed for cough. 180 mL 0   Cholecalciferol (VITAMIN D3) 250 MCG (10000 UT) capsule Take 1 tablet by mouth daily. Nature's Made     cyanocobalamin (,VITAMIN B-12,) 1000 MCG/ML injection Inject 1 mL (1,000 mcg total) into the muscle every 30 (thirty) days. 3 mL 3   diphenhydrAMINE (BENADRYL) 25 MG tablet Take 1 tablet (25 mg total) by mouth every 6 (six) hours as needed. (Patient taking differently: Take 25 mg by mouth every 6 (six) hours as needed for sleep, itching or allergies.) 30 tablet 0   dorzolamide (TRUSOPT) 2 % ophthalmic solution 1 drop 2 (two) times daily.      doxycycline (VIBRA-TABS) 100 MG tablet Take 1 tablet (100 mg total) by mouth 2 (two) times daily. 14 tablet 0   EPINEPHrine 0.3 mg/0.3 mL IJ SOAJ injection Inject 0.3 mg into the muscle as needed for anaphylaxis. 1 each 5   gabapentin (NEURONTIN) 300 MG capsule TAKE 1 CAPSULE(300 MG) BY MOUTH FOUR TIMES DAILY AS NEEDED FOR PAIN 120 capsule 0   Glucos-Chond-Hyal Ac-Ca Fructo (MOVE FREE JOINT HEALTH ADVANCE PO) Take 1 tablet by mouth daily.     hydrALAZINE (APRESOLINE) 50 MG tablet Take 50 mg by mouth 2 (two) times daily.     HYDROcodone-acetaminophen (NORCO/VICODIN) 5-325 MG tablet Take 1 tablet by mouth every 6 (six) hours as needed.     hydrOXYzine (ATARAX) 50 MG tablet Take 1 tablet (50 mg total) by mouth 2 (two) times daily. 60 tablet 1   meclizine (ANTIVERT) 25 MG tablet Take 1 tablet (25 mg total) by mouth daily as needed. 40 tablet 5   methocarbamol (ROBAXIN) 500 MG tablet Take 500 mg by mouth 4 (four) times daily.     metoprolol succinate (TOPROL-XL) 100 MG 24 hr tablet TAKE 1 TABLET(100 MG) BY MOUTH DAILY WITH OR IMMEDIATELY FOLLOWING A MEAL 90 tablet 3   montelukast (SINGULAIR) 10 MG tablet Take 10 mg by mouth daily.     Multiple Vitamin (MULTIVITAMIN WITH MINERALS) TABS tablet Take 1  tablet by mouth daily after breakfast.      mupirocin ointment (BACTROBAN) 2 % APPLY EXTERNALLY TO THE AFFECTED AREA TWICE DAILY FOR 3 WEEKS, PLACE COTTON BETWEEN TOES TO ALLOW BETTER AERATION AS NEEDED (Patient taking differently: Apply 1 application  topically See admin instructions. APPLY EXTERNALLY TO THE AFFECTED AREA TWICE DAILY FOR 3 WEEKS, PLACE COTTON BETWEEN TOES TO ALLOW BETTER AERATION AS NEEDED) 66 g 1   nitroGLYCERIN (NITROSTAT) 0.4 MG SL tablet Place 1 tablet (0.4 mg total) under the tongue every 5 (five) minutes as needed for chest pain. 30 tablet 1   ondansetron (ZOFRAN-ODT) 4 MG disintegrating tablet Take 1 tablet (4 mg total) by mouth every 8 (eight) hours as needed for nausea or vomiting. 30  tablet 2   OVER THE COUNTER MEDICATION Take 2 capsules by mouth daily. Zhou: thyroid supplement     pantoprazole (PROTONIX) 40 MG tablet Take 1 tablet (40 mg total) by mouth daily. 90 tablet 3   predniSONE (DELTASONE) 10 MG tablet 2 tabs by mouth per day for 5 days 10 tablet 0   QNASL 80 MCG/ACT AERS USE 2 SPRAYS IN EACH NOSTRIL DAILY 10.6 g 2   rosuvastatin (CRESTOR) 20 MG tablet Take 1 tablet (20 mg total) by mouth daily. 90 tablet 3   tiZANidine (ZANAFLEX) 4 MG tablet Take 2-4 mg by mouth 3 (three) times daily as needed.     tizanidine (ZANAFLEX) 6 MG capsule TAKE 1 CAPSULE(6 MG) BY MOUTH THREE TIMES DAILY 90 capsule 11   torsemide (DEMADEX) 20 MG tablet Take 10 mg by mouth See admin instructions. Tuesday Thursday Saturday     Ubrogepant (UBRELVY) 100 MG TABS Take 100 mg by mouth 2 (two) times daily as needed (can take a seecond dose 2 hiurs after first do not exceed 2 a day.). 10 tablet 5   VENTOLIN HFA 108 (90 Base) MCG/ACT inhaler INHALE 2 PUFFS INTO THE LUNGS EVERY 6 HOURS AS NEEDED FOR WHEEZING OR SHORTNESS OF BREATH 36 g 3   [DISCONTINUED] zolpidem (AMBIEN) 5 MG tablet Take 1-2 tablets (5-10 mg total) by mouth at bedtime as needed for sleep. (Patient not taking: Reported on 04/23/2019) 30 tablet 2   No current facility-administered medications on file prior to visit.    Review of Systems     Objective:  There were no vitals filed for this visit. BP Readings from Last 3 Encounters:  06/14/22 122/76  04/06/22 132/78  03/02/22 128/72   Wt Readings from Last 3 Encounters:  06/14/22 116 lb (52.6 kg)  04/07/22 212 lb (96.2 kg)  04/06/22 212 lb (96.2 kg)   There is no height or weight on file to calculate BMI.    Physical Exam         Assessment & Plan:    See Problem List for Assessment and Plan of chronic medical problems.

## 2022-07-17 ENCOUNTER — Ambulatory Visit (INDEPENDENT_AMBULATORY_CARE_PROVIDER_SITE_OTHER): Payer: 59 | Admitting: Internal Medicine

## 2022-07-17 ENCOUNTER — Encounter: Payer: Self-pay | Admitting: Internal Medicine

## 2022-07-17 VITALS — BP 120/78 | HR 78 | Temp 98.6°F | Ht 70.0 in | Wt 207.0 lb

## 2022-07-17 DIAGNOSIS — H6992 Unspecified Eustachian tube disorder, left ear: Secondary | ICD-10-CM | POA: Diagnosis not present

## 2022-07-17 DIAGNOSIS — K219 Gastro-esophageal reflux disease without esophagitis: Secondary | ICD-10-CM | POA: Diagnosis not present

## 2022-07-17 DIAGNOSIS — R1310 Dysphagia, unspecified: Secondary | ICD-10-CM

## 2022-07-17 DIAGNOSIS — R110 Nausea: Secondary | ICD-10-CM

## 2022-07-17 DIAGNOSIS — A059 Bacterial foodborne intoxication, unspecified: Secondary | ICD-10-CM

## 2022-07-17 MED ORDER — QNASL 80 MCG/ACT NA AERS: 2.0000 | INHALATION_SPRAY | Freq: Every day | NASAL | 2 refills | Status: DC

## 2022-07-17 MED ORDER — FAMOTIDINE 40 MG PO TABS: 40.0000 mg | ORAL_TABLET | Freq: Every day | ORAL | 5 refills | Status: DC

## 2022-07-17 MED ORDER — ONDANSETRON 4 MG PO TBDP: 4.0000 mg | ORAL_TABLET | Freq: Three times a day (TID) | ORAL | 2 refills | Status: DC | PRN

## 2022-07-17 NOTE — Patient Instructions (Addendum)
Medications changes include :   Add pepcid 40 mg daily in the evening.  Continue the allergy medications and Qnasal daily.   Use the zofran as needed for nausea      Return if symptoms worsen or fail to improve.

## 2022-07-28 ENCOUNTER — Ambulatory Visit (INDEPENDENT_AMBULATORY_CARE_PROVIDER_SITE_OTHER): Payer: 59 | Admitting: Internal Medicine

## 2022-07-28 ENCOUNTER — Encounter: Payer: Self-pay | Admitting: Internal Medicine

## 2022-07-28 ENCOUNTER — Ambulatory Visit (INDEPENDENT_AMBULATORY_CARE_PROVIDER_SITE_OTHER): Payer: 59

## 2022-07-28 VITALS — BP 134/82 | HR 72 | Temp 98.0°F | Ht 70.0 in | Wt 205.0 lb

## 2022-07-28 DIAGNOSIS — E559 Vitamin D deficiency, unspecified: Secondary | ICD-10-CM | POA: Diagnosis not present

## 2022-07-28 DIAGNOSIS — I10 Essential (primary) hypertension: Secondary | ICD-10-CM

## 2022-07-28 DIAGNOSIS — J329 Chronic sinusitis, unspecified: Secondary | ICD-10-CM

## 2022-07-28 DIAGNOSIS — R059 Cough, unspecified: Secondary | ICD-10-CM | POA: Diagnosis not present

## 2022-07-28 DIAGNOSIS — J4530 Mild persistent asthma, uncomplicated: Secondary | ICD-10-CM

## 2022-07-28 LAB — POC SOFIA SARS ANTIGEN FIA: SARS Coronavirus 2 Ag: NEGATIVE

## 2022-07-28 MED ORDER — HYDROCOD POLI-CHLORPHE POLI ER 10-8 MG/5ML PO SUER: 5.0000 mL | Freq: Two times a day (BID) | ORAL | 0 refills | Status: DC | PRN

## 2022-07-28 MED ORDER — DOXYCYCLINE HYCLATE 100 MG PO TABS: 100.0000 mg | ORAL_TABLET | Freq: Two times a day (BID) | ORAL | 0 refills | Status: DC

## 2022-07-28 MED ORDER — HYDROCODONE BIT-HOMATROP MBR 5-1.5 MG/5ML PO SOLN: 5.0000 mL | Freq: Four times a day (QID) | ORAL | 0 refills | Status: DC | PRN

## 2022-07-28 NOTE — Patient Instructions (Addendum)
Your COVID testing was done today  Please take all new medication as prescribed- the antibiotic, and cough medicine as needed  Please continue all other medications as before, and refills have been done if requested.  Please have the pharmacy call with any other refills you may need.  Please keep your appointments with your specialists as you may have planned  Please go to the XRAY Department in the first floor for the x-ray testing  You will be contacted by phone if any changes need to be made immediately.  Otherwise, you will receive a letter about your results with an explanation, but please check with MyChart first.  Please remember to sign up for MyChart if you have not done so, as this will be important to you in the future with finding out test results, communicating by private email, and scheduling acute appointments online when needed.

## 2022-07-28 NOTE — Progress Notes (Unsigned)
Patient ID: Casey Brennan, male   DOB: 1961-05-30, 62 y.o.   MRN: [id number]        Chief Complaint: follow up sinus pain and congestion, asthma, htn, low vit d       HPI:  Casey Brennan is a 62 y.o. male  Here with 2-3 days acute onset fever, facial pain, pressure, headache, general weakness and malaise, and greenish d/c, with mild ST and cough, but pt denies chest pain, wheezing, increased sob or doe, orthopnea, PND, increased LE swelling, palpitations, dizziness or syncope.   Pt denies polydipsia, polyuria, or new focal neuro s/s.    Pt denies wt loss, night sweats, loss of appetite, or other constitutional symptoms         Wt Readings from Last 3 Encounters:  07/28/22 205 lb (93 kg)  07/17/22 207 lb (93.9 kg)  06/14/22 116 lb (52.6 kg)   BP Readings from Last 3 Encounters:  07/28/22 134/82  07/17/22 120/78  06/14/22 122/76         Past Medical History:  Diagnosis Date   Asthma    very rare   Chronic anemia    normocytic   Chronic folliculitis    Environmental and seasonal allergies    H/O pericarditis    2010--  myopercarditis--  resolved   Headache(784.0)    HX SEVERE FRONTAL HA'S   Hiatal hernia    History of gastric ulcer    History of kidney stones    History of MRSA infection 2010   infected boil   HIV (human immunodeficiency virus infection) ([place]) 1988   Hypertension    Lytic bone lesion of hip    WORK-UP DONE BY ONCOLOGIST DR HA --  NOT MALIGNANT   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   Pneumonia    hx of   Post concussion syndrome    resolved   Prostate cancer ([place]) 04/25/2013   gleason 3+3=6, volume 30 gm   Ulcer    hx of gastric   Past Surgical History:  Procedure Laterality Date   ACDCF  09/29/2020   ANTERIOR CERVICAL DECOMPRESSION/DISCECTOMY FUSION 4 LEVELS N/A 09/29/2020   Procedure: ANTERIOR CERVICAL DECOMPRESSION FUSION CERVICAL 3-CERVICAL 4 , CERVICAL 4 - CERVICAL 5, CERVICAL 5 - CERVICAL 6 WITH INSTRUMENTATION AND ALLOGRAFT;  Surgeon:  Phylliss Bob, MD;  Location: [facility];  Service: Orthopedics;  Laterality: N/A;   CARDIAC CATHETERIZATION  03-23-2009  DR COOPER   NORMAL CORONARY ARTERIES   CHOLECYSTECTOMY N/A 11/06/2014   Procedure: LAPAROSCOPIC CHOLECYSTECTOMY WITH INTRAOPERATIVE CHOLANGIOGRAM;  Surgeon: Judeth Horn, MD;  Location: [place];  Service: General;  Laterality: N/A;   COLONOSCOPY  12/26/2011   Procedure: COLONOSCOPY;  Surgeon: Lear Ng, MD;  Location: [facility];  Service: Endoscopy;  Laterality: N/A;   COLONOSCOPY     COLONOSCOPY WITH PROPOFOL N/A 07/29/2014   Procedure: COLONOSCOPY WITH PROPOFOL;  Surgeon: Lear Ng, MD;  Location: [facility];  Service: Endoscopy;  Laterality: N/A;   CYSTOSCOPY N/A 05/18/2018   Procedure: CYSTOSCOPY REMOVAL FOREIGN BODY;  Surgeon: Kathie Rhodes, MD;  Location: [facility];  Service: Urology;  Laterality: N/A;   CYSTOSCOPY/RETROGRADE/URETEROSCOPY Bilateral 04/25/2013   Procedure: CYSTOSCOPY/ BILATERAL RETROGRADES; BLADDER BIOPSIES;  Surgeon: Alexis Frock, MD;  Location: [facility];  Service: Urology;  Laterality: Bilateral;   DENTAL EXAMINATION UNDER ANESTHESIA     ESOPHAGOGASTRODUODENOSCOPY  12/26/2011   Procedure: ESOPHAGOGASTRODUODENOSCOPY (EGD);  Surgeon: Lear Ng, MD;  Location: [facility];  Service: Endoscopy;  Laterality: N/A;   ESOPHAGOGASTRODUODENOSCOPY (EGD) WITH PROPOFOL N/A 07/29/2014   Procedure: ESOPHAGOGASTRODUODENOSCOPY (EGD) WITH PROPOFOL;  Surgeon: Lear Ng, MD;  Location: [place];  Service: Endoscopy;  Laterality: N/A;   EXCISION CHRONIC LEFT BREAST ABSCESS  09-21-2010   EXCISIONAL BX LEFT BREAST MASS/  I  &  D LEFT BREAST ABSCESS  03-24-2009   KNEE ARTHROSCOPY Right 1985   left axilla biopsy  04/2013   left hip biopsy  10/2012   LYMPHADENECTOMY Bilateral 08/18/2013   Procedure: LYMPHADENECTOMY "PELVIC LYMPH NODE DISSECTION";  Surgeon: Alexis Frock, MD;  Location: [facility];  Service: Urology;   Laterality: Bilateral;   PROSTATE BIOPSY N/A 04/25/2013   Procedure: BIOPSY TRANSRECTAL ULTRASONIC PROSTATE (TUBP);  Surgeon: Alexis Frock, MD;  Location: [facility];  Service: Urology;  Laterality: N/A;   ROBOT ASSISTED LAPAROSCOPIC RADICAL PROSTATECTOMY N/A 08/18/2013   Procedure: ROBOTIC ASSISTED LAPAROSCOPIC RADICAL PROSTATECTOMY;  Surgeon: Alexis Frock, MD;  Location: [facility];  Service: Urology;  Laterality: N/A;   TOTAL KNEE ARTHROPLASTY Right 08/01/2016   08/17/16   TOTAL KNEE ARTHROPLASTY Right 08/01/2016   Procedure: TOTAL KNEE ARTHROPLASTY;  Surgeon: Renette Butters, MD;  Location: [place];  Service: Orthopedics;  Laterality: Right;   UPPER GASTROINTESTINAL ENDOSCOPY     UPPER LEG SOFT TISSUE BIOPSY Left 2012   lthigh    reports that he quit smoking about 12 years ago. His smoking use included cigars and cigarettes. He has a 34.50 pack-year smoking history. He has never used smokeless tobacco. He reports that he does not currently use alcohol. He reports that he does not use drugs. family history includes Asthma in his father and sister; Cancer in his father and maternal uncle; Diabetes in his father; Hypertension in his sister; Stroke in his father. Allergies  Allergen Reactions   Amoxapine Shortness Of Breath   Chocolate Shortness Of Breath and Other (See Comments)    Asthma attack, welts   Ciprofloxacin Anaphylaxis, Dermatitis, Itching, Nausea And Vomiting, Shortness Of Breath, Swelling and Hives   Descovy [Emtricitabine-Tenofovir Af] Shortness Of Breath    Nasuea, vomiiting, diarrhea, sob, whelps, throat closes   Genvoya [Elviteg-Cobic-Emtricit-Tenofaf] Shortness Of Breath    Nausea, vomitting, diarreha, sob, rash, throat closing   Morphine And Related Shortness Of Breath, Itching and Other (See Comments)     chest pain   Other Anaphylaxis   Oxycodone-Acetaminophen Shortness Of Breath   Penicillins Shortness Of Breath and Rash    Has patient had a PCN  reaction causing immediate rash, facial/tongue/throat swelling, SOB or lightheadedness with hypotension: Yes Has patient had a PCN reaction causing severe rash involving mucus membranes or skin necrosis: No Has patient had a PCN reaction that required hospitalization No Has patient had a PCN reaction occurring within the last 10 years: Yes If all of the above answers are "NO", then may proceed with Cephalosporin use.   Sulfa Antibiotics Anaphylaxis and Other (See Comments)   Tomato Shortness Of Breath   Vancomycin Shortness Of Breath and Other (See Comments)   Darunavir Itching   Amlodipine Other (See Comments)    swelling   Carvedilol     LEG SWELLING, DIZZNESS   Flagyl [Metronidazole] Itching   Milk-Related Compounds    Morphine Other (See Comments)   Penicillin G Other (See Comments)   Toradol [Ketorolac Tromethamine] Nausea And Vomiting    itching   Acetaminophen Rash and Other (See Comments)    Pt tolerates percocet and also plain APAP without rash  Tramadol Itching, Nausea And Vomiting and Other (See Comments)   Current Outpatient Medications on File Prior to Visit  Medication Sig Dispense Refill   albuterol (PROVENTIL) (2.5 MG/3ML) 0.083% nebulizer solution Take 3 mLs (2.5 mg total) by nebulization every 6 (six) hours as needed for wheezing or shortness of breath. 75 mL 3   ALPRAZolam (XANAX) 0.5 MG tablet TAKE 1 TABLET(0.5 MG) BY MOUTH TWICE DAILY AS NEEDED FOR SLEEP 60 tablet 2   Beclomethasone Dipropionate (QNASL) 80 MCG/ACT AERS Place 2 sprays into both nostrils daily. 10.6 g 2   bictegravir-emtricitabine-tenofovir AF (BIKTARVY) 50-200-25 MG TABS tablet Take 1 tablet by mouth daily. 30 tablet 11   BREZTRI AEROSPHERE 160-9-4.8 MCG/ACT AERO INHALE 2 PUFFS INTO THE LUNGS IN THE MORNING AND AT BEDTIME 10.7 g 0   cetirizine (ZYRTEC) 10 MG tablet Take 1 tablet (10 mg total) by mouth daily. 90 tablet 3   Cholecalciferol (VITAMIN D3) 250 MCG (10000 UT) capsule Take 1 tablet by  mouth daily. [employer]     cyanocobalamin (,VITAMIN B-12,) 1000 MCG/ML injection Inject 1 mL (1,000 mcg total) into the muscle every 30 (thirty) days. 3 mL 3   diphenhydrAMINE (BENADRYL) 25 MG tablet Take 1 tablet (25 mg total) by mouth every 6 (six) hours as needed. (Patient taking differently: Take 25 mg by mouth every 6 (six) hours as needed for sleep, itching or allergies.) 30 tablet 0   dorzolamide (TRUSOPT) 2 % ophthalmic solution 1 drop 2 (two) times daily.     EPINEPHrine 0.3 mg/0.3 mL IJ SOAJ injection Inject 0.3 mg into the muscle as needed for anaphylaxis. 1 each 5   famotidine (PEPCID) 40 MG tablet Take 1 tablet (40 mg total) by mouth daily. 30 tablet 5   gabapentin (NEURONTIN) 300 MG capsule TAKE 1 CAPSULE(300 MG) BY MOUTH FOUR TIMES DAILY AS NEEDED FOR PAIN 120 capsule 0   Glucos-Chond-Hyal Ac-Ca Fructo (MOVE FREE JOINT HEALTH ADVANCE PO) Take 1 tablet by mouth daily.     hydrALAZINE (APRESOLINE) 50 MG tablet Take 50 mg by mouth 2 (two) times daily.     HYDROcodone-acetaminophen (NORCO/VICODIN) 5-325 MG tablet Take 1 tablet by mouth every 6 (six) hours as needed.     hydrOXYzine (ATARAX) 50 MG tablet Take 1 tablet (50 mg total) by mouth 2 (two) times daily. 60 tablet 1   meclizine (ANTIVERT) 25 MG tablet Take 1 tablet (25 mg total) by mouth daily as needed. 40 tablet 5   methocarbamol (ROBAXIN) 500 MG tablet Take 500 mg by mouth 4 (four) times daily.     metoprolol succinate (TOPROL-XL) 100 MG 24 hr tablet TAKE 1 TABLET(100 MG) BY MOUTH DAILY WITH OR IMMEDIATELY FOLLOWING A MEAL 90 tablet 3   montelukast (SINGULAIR) 10 MG tablet Take 10 mg by mouth daily.     Multiple Vitamin (MULTIVITAMIN WITH MINERALS) TABS tablet Take 1 tablet by mouth daily after breakfast.      mupirocin ointment (BACTROBAN) 2 % APPLY EXTERNALLY TO THE AFFECTED AREA TWICE DAILY FOR 3 WEEKS, PLACE COTTON BETWEEN TOES TO ALLOW BETTER AERATION AS NEEDED (Patient taking differently: Apply 1 application  topically  See admin instructions. APPLY EXTERNALLY TO THE AFFECTED AREA TWICE DAILY FOR 3 WEEKS, PLACE COTTON BETWEEN TOES TO ALLOW BETTER AERATION AS NEEDED) 66 g 1   ondansetron (ZOFRAN-ODT) 4 MG disintegrating tablet Take 1 tablet (4 mg total) by mouth every 8 (eight) hours as needed for nausea or vomiting. 30 tablet 2   OVER THE COUNTER  MEDICATION Take 2 capsules by mouth daily. Zhou: thyroid supplement     pantoprazole (PROTONIX) 40 MG tablet Take 1 tablet (40 mg total) by mouth daily. 90 tablet 3   rosuvastatin (CRESTOR) 20 MG tablet Take 1 tablet (20 mg total) by mouth daily. 90 tablet 3   tiZANidine (ZANAFLEX) 4 MG tablet Take 2-4 mg by mouth 3 (three) times daily as needed.     tizanidine (ZANAFLEX) 6 MG capsule TAKE 1 CAPSULE(6 MG) BY MOUTH THREE TIMES DAILY 90 capsule 11   torsemide (DEMADEX) 20 MG tablet Take 10 mg by mouth See admin instructions. Tuesday Thursday Saturday     Ubrogepant (UBRELVY) 100 MG TABS Take 100 mg by mouth 2 (two) times daily as needed (can take a seecond dose 2 hiurs after first do not exceed 2 a day.). 10 tablet 5   VENTOLIN HFA 108 (90 Base) MCG/ACT inhaler INHALE 2 PUFFS INTO THE LUNGS EVERY 6 HOURS AS NEEDED FOR WHEEZING OR SHORTNESS OF BREATH 36 g 3   nitroGLYCERIN (NITROSTAT) 0.4 MG SL tablet Place 1 tablet (0.4 mg total) under the tongue every 5 (five) minutes as needed for chest pain. 30 tablet 1   [DISCONTINUED] zolpidem (AMBIEN) 5 MG tablet Take 1-2 tablets (5-10 mg total) by mouth at bedtime as needed for sleep. (Patient not taking: Reported on 04/23/2019) 30 tablet 2   No current facility-administered medications on file prior to visit.        ROS:  All others reviewed and negative.  Objective        PE:  BP 134/82 (BP Location: Left Arm, Patient Position: Sitting, Cuff Size: Large)   Pulse 72   Temp 98 F (36.7 C) (Oral)   Ht '5\' 10"'$  (1.778 m)   Wt 205 lb (93 kg)   SpO2 95%   BMI 29.41 kg/m                 Constitutional: Pt appears in NAD, mild  ill               HENT: Head: NCAT.                Right Ear: External ear normal.                 Left Ear: External ear normal. Bilat tm's with mild erythema.  Max sinus areas mild tender.  Pharynx with mild erythema, no exudate               Eyes: . Pupils are equal, round, and reactive to light. Conjunctivae and EOM are normal               Nose: without d/c or deformity               Neck: Neck supple. Gross normal ROM               Cardiovascular: Normal rate and regular rhythm.                 Pulmonary/Chest: Effort normal and breath sounds without rales or wheezing.                               Neurological: Pt is alert. At baseline orientation, motor grossly intact               Skin: Skin is warm. No rashes, no other new lesions, LE edema - none  Psychiatric: Pt behavior is normal without agitation   Micro: none  Cardiac tracings I have personally interpreted today:  none  Pertinent Radiological findings (summarize): none   Lab Results  Component Value Date   WBC 5.4 02/25/2022   HGB 12.1 (L) 02/25/2022   HCT 36.9 (L) 02/25/2022   PLT 184 02/25/2022   GLUCOSE 110 (H) 02/25/2022   CHOL 155 08/24/2021   TRIG 98.0 08/24/2021   HDL 46.40 08/24/2021   LDLCALC 89 08/24/2021   ALT 16 01/25/2022   AST 17 01/25/2022   NA 140 02/25/2022   K 4.1 02/25/2022   CL 110 02/25/2022   CREATININE 1.19 02/25/2022   BUN 10 02/25/2022   CO2 25 02/25/2022   TSH 1.48 08/24/2021   PSA 0.00 (L) 08/24/2021   INR 1.0 06/23/2021   HGBA1C <4.0 08/25/2021   MICROALBUR 16.88 (H) 10/05/2011   POCT - COVID - neg  Assessment/Plan:  Casey Brennan is a 62 y.o. 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  Vitamin D deficiency Last vitamin D Lab Results  Component Value Date   VD25OH 42.22 08/24/2021   Stable, cont oral replacement   Sinusitis Mild to mod, for antibx course doxycline 100 bid,  cough med prn, to f/u any worsening symptoms or concerns   Mild persistent asthma, uncomplicated Stable, cont inhaler prn  Essential hypertension BP Readings from Last 3 Encounters:  07/28/22 134/82  07/17/22 120/78  06/14/22 122/76   Stable, pt to continue medical treatment  hydralazine 50 bid  Followup: Return if symptoms worsen or fail to improve.  Cathlean Cower, MD 07/31/2022 7:26 PM [facility]

## 2022-07-31 ENCOUNTER — Telehealth: Payer: Self-pay | Admitting: Internal Medicine

## 2022-07-31 ENCOUNTER — Encounter: Payer: Self-pay | Admitting: Internal Medicine

## 2022-07-31 NOTE — Assessment & Plan Note (Signed)
BP Readings from Last 3 Encounters:  07/28/22 134/82  07/17/22 120/78  06/14/22 122/76   Stable, pt to continue medical treatment  hydralazine 50 bid

## 2022-07-31 NOTE — Assessment & Plan Note (Signed)
Mild to mod, for antibx course doxycline 100 bid,  cough med prn, to f/u any worsening symptoms or concerns

## 2022-07-31 NOTE — Assessment & Plan Note (Signed)
Stable, cont inhaler prn

## 2022-07-31 NOTE — Assessment & Plan Note (Signed)
Last vitamin D Lab Results  Component Value Date   VD25OH 42.22 08/24/2021   Stable, cont oral replacement  

## 2022-07-31 NOTE — Telephone Encounter (Signed)
This is not likely due to the antibiotic since this is not an allergic reaction and the antibiotic does not do this type of issue.  Ok to continue the doxycycline

## 2022-07-31 NOTE — Telephone Encounter (Signed)
Patient sister called stating patient was prescribed doxycyline '100mg'$  on Friday when seen at the office. States patient is allergic to the antibiotic as his belly would swell and he had some shortness of breath. Asked if patient could be prescribed a different antibiotic.

## 2022-07-31 NOTE — Telephone Encounter (Signed)
Possible allergy to doxycycline which include swelling of abdomen and SOB, patient is requesting something different

## 2022-08-01 NOTE — Telephone Encounter (Signed)
Patient informed. 

## 2022-08-02 ENCOUNTER — Other Ambulatory Visit: Payer: Self-pay | Admitting: Pulmonary Disease

## 2022-08-02 ENCOUNTER — Other Ambulatory Visit: Payer: Self-pay | Admitting: Internal Medicine

## 2022-08-02 ENCOUNTER — Other Ambulatory Visit: Payer: Self-pay | Admitting: Neurology

## 2022-08-02 DIAGNOSIS — B20 Human immunodeficiency virus [HIV] disease: Secondary | ICD-10-CM

## 2022-08-02 DIAGNOSIS — R252 Cramp and spasm: Secondary | ICD-10-CM

## 2022-08-02 DIAGNOSIS — G44209 Tension-type headache, unspecified, not intractable: Secondary | ICD-10-CM

## 2022-08-07 ENCOUNTER — Telehealth: Payer: Self-pay | Admitting: Neurology

## 2022-08-07 NOTE — Telephone Encounter (Signed)
Ok to schedule pt for sooner appt for headaches. We have not seen pt for memory concerns, this is a new issue and will need to discussed with PCP or new referral sent to [place].

## 2022-08-07 NOTE — Telephone Encounter (Signed)
Pt sister is calling. Requesting a sooner appointment for pt. Stated he is having worsening headaches and pt is forgetting things

## 2022-08-10 ENCOUNTER — Ambulatory Visit: Payer: 59 | Admitting: Internal Medicine

## 2022-08-14 ENCOUNTER — Encounter: Payer: Self-pay | Admitting: Internal Medicine

## 2022-08-14 ENCOUNTER — Ambulatory Visit (INDEPENDENT_AMBULATORY_CARE_PROVIDER_SITE_OTHER): Payer: 59 | Admitting: Internal Medicine

## 2022-08-14 VITALS — BP 134/82 | HR 70 | Temp 98.8°F | Ht 70.0 in | Wt 209.0 lb

## 2022-08-14 DIAGNOSIS — R0789 Other chest pain: Secondary | ICD-10-CM

## 2022-08-14 DIAGNOSIS — N182 Chronic kidney disease, stage 2 (mild): Secondary | ICD-10-CM

## 2022-08-14 DIAGNOSIS — E782 Mixed hyperlipidemia: Secondary | ICD-10-CM | POA: Diagnosis not present

## 2022-08-14 DIAGNOSIS — Z0001 Encounter for general adult medical examination with abnormal findings: Secondary | ICD-10-CM | POA: Diagnosis not present

## 2022-08-14 DIAGNOSIS — Z8546 Personal history of malignant neoplasm of prostate: Secondary | ICD-10-CM

## 2022-08-14 DIAGNOSIS — R739 Hyperglycemia, unspecified: Secondary | ICD-10-CM

## 2022-08-14 DIAGNOSIS — I10 Essential (primary) hypertension: Secondary | ICD-10-CM

## 2022-08-14 DIAGNOSIS — J329 Chronic sinusitis, unspecified: Secondary | ICD-10-CM | POA: Diagnosis not present

## 2022-08-14 DIAGNOSIS — J449 Chronic obstructive pulmonary disease, unspecified: Secondary | ICD-10-CM | POA: Diagnosis not present

## 2022-08-14 DIAGNOSIS — E538 Deficiency of other specified B group vitamins: Secondary | ICD-10-CM | POA: Diagnosis not present

## 2022-08-14 DIAGNOSIS — E559 Vitamin D deficiency, unspecified: Secondary | ICD-10-CM | POA: Diagnosis not present

## 2022-08-14 DIAGNOSIS — H6092 Unspecified otitis externa, left ear: Secondary | ICD-10-CM | POA: Diagnosis not present

## 2022-08-14 DIAGNOSIS — N2889 Other specified disorders of kidney and ureter: Secondary | ICD-10-CM

## 2022-08-14 DIAGNOSIS — J328 Other chronic sinusitis: Secondary | ICD-10-CM

## 2022-08-14 LAB — CBC WITH DIFFERENTIAL/PLATELET
Basophils Absolute: 0.1 10*3/uL (ref 0.0–0.1)
Basophils Relative: 0.8 % (ref 0.0–3.0)
Eosinophils Absolute: 1.5 10*3/uL — ABNORMAL HIGH (ref 0.0–0.7)
Eosinophils Relative: 22.6 % — ABNORMAL HIGH (ref 0.0–5.0)
HCT: 39.7 % (ref 39.0–52.0)
Hemoglobin: 13 g/dL (ref 13.0–17.0)
Lymphocytes Relative: 44.6 % (ref 12.0–46.0)
Lymphs Abs: 2.9 10*3/uL (ref 0.7–4.0)
MCHC: 32.6 g/dL (ref 30.0–36.0)
MCV: 96.2 fl (ref 78.0–100.0)
Monocytes Absolute: 0.4 10*3/uL (ref 0.1–1.0)
Monocytes Relative: 6.1 % (ref 3.0–12.0)
Neutro Abs: 1.7 10*3/uL (ref 1.4–7.7)
Neutrophils Relative %: 25.9 % — ABNORMAL LOW (ref 43.0–77.0)
Platelets: 216 10*3/uL (ref 150.0–400.0)
RBC: 4.13 Mil/uL — ABNORMAL LOW (ref 4.22–5.81)
RDW: 17.5 % — ABNORMAL HIGH (ref 11.5–15.5)
WBC: 6.4 10*3/uL (ref 4.0–10.5)

## 2022-08-14 MED ORDER — NEOMYCIN-POLYMYXIN-HC 3.5-10000-1 OT SOLN: 4.0000 [drp] | Freq: Four times a day (QID) | OTIC | 0 refills | Status: DC

## 2022-08-14 NOTE — Assessment & Plan Note (Signed)
Asymptoatic for f/u psa, has urology f/u in about 3 mo

## 2022-08-14 NOTE — Assessment & Plan Note (Signed)
Age and sex appropriate education and counseling updated with regular exercise and diet Referrals for preventative services - none needed Immunizations addressed - declines tdap and covid booster Smoking counseling  - none needed Evidence for depression or other mood disorder - none significant Most recent labs reviewed. I have personally reviewed and have noted: 1) the patient's medical and social history 2) The patient's current medications and supplements 3) The patient's height, weight, and BMI have been recorded in the chart

## 2022-08-14 NOTE — Assessment & Plan Note (Signed)
Lab Results  Component Value Date   CREATININE 1.19 02/25/2022   Stable overall, cont to avoid nephrotoxins for f/u lab today

## 2022-08-14 NOTE — Assessment & Plan Note (Signed)
Lab Results  Component Value Date   LDLCALC 89 08/24/2021   Stable, pt to continue current statin crestor 20 mg, for f/u lab

## 2022-08-14 NOTE — Assessment & Plan Note (Signed)
Stablei, cont inhaler prn

## 2022-08-14 NOTE — Assessment & Plan Note (Signed)
Mild to mod, for antibx course cortisporin otic qid,  to f/u any worsening symptoms or concerns

## 2022-08-14 NOTE — Assessment & Plan Note (Signed)
BP Readings from Last 3 Encounters:  08/14/22 134/82  07/28/22 134/82  07/17/22 120/78   Stable, pt to continue medical treatment hdyralazine 50 bid, toprol xl 100 qd

## 2022-08-14 NOTE — Assessment & Plan Note (Signed)
For ENT referral at [facility] per pt reqeust

## 2022-08-14 NOTE — Progress Notes (Signed)
Patient ID: Casey Brennan, male   DOB: [birth year], 62 y.o.   MRN: [id number]         Chief Complaint:: wellness exam and ongoing cough and congestion (With yellow mucous)  with sinus congestion unable to see local ENT due to not taking his insurance, new left ear discharge and discomfort,  copd. Ckd2,, htn       HPI:  Casey Brennan is a 62 y.o. male here for wellness exam; declines tdap and covid booster o/w up to date                        Also Pt denies increased sob or doe, wheezing, orthopnea, PND, increased LE swelling, palpitations, dizziness or syncope, but has fleeting chest pains with coughing.   Pt denies polydipsia, polyuria, or new focal neuro s/s.    Pt denies fever, wt loss, night sweats, loss of appetite, or other constitutional symptoms, but does have new 3 days left ear discharge and discomfort, in addition to his more chronic sinusitis with congestion and cough.     Wt Readings from Last 3 Encounters:  08/14/22 209 lb (94.8 kg)  07/28/22 205 lb (93 kg)  07/17/22 207 lb (93.9 kg)   BP Readings from Last 3 Encounters:  08/14/22 134/82  07/28/22 134/82  07/17/22 120/78   Immunization History  Administered Date(s) Administered   Hepatitis A 06/12/2011   Influenza Split 06/12/2012   Influenza Whole 04/19/2009, 07/25/2010, 02/22/2011   Influenza,inj,Quad PF,6+ Mos 06/04/2013, 04/27/2014, 07/21/2015, 05/22/2016, 04/27/2017, 05/20/2020, 04/06/2022   Influenza-Unspecified 01/30/2015   Meningococcal Mcv4o 05/22/2016   PFIZER Comirnaty(Gray Top)Covid-19 Tri-Sucrose Vaccine 05/25/2020   PFIZER(Purple Top)SARS-COV-2 Vaccination 10/23/2019, 11/13/2019, 05/25/2020   PPD Test 05/05/2009   Pfizer Covid-19 Vaccine Bivalent Booster 49yr & up 04/08/2021   Pneumococcal Conjugate-13 11/11/2020   Pneumococcal Polysaccharide-23 03/22/2009, 08/19/2013, 11/15/2020   Tdap 06/12/2011   Zoster Recombinat (Shingrix) 12/27/2020, 04/06/2022, 06/14/2022   Health Maintenance Due  Topic  Date Due   DTaP/Tdap/Td (2 - Td or Tdap) 06/11/2021      Past Medical History:  Diagnosis Date   Asthma    very rare   Chronic anemia    normocytic   Chronic folliculitis    Environmental and seasonal allergies    H/O pericarditis    2010--  myopercarditis--  resolved   Headache(784.0)    HX SEVERE FRONTAL HA'S   Hiatal hernia    History of gastric ulcer    History of kidney stones    History of MRSA infection 2010   infected boil   [place]   Hypertension    Lytic bone lesion of hip    WORK-UP DONE BY ONCOLOGIST DR HA --  NOT MALIGNANT   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   Pneumonia    hx of   Post concussion syndrome    resolved   Prostate cancer ([facility] 04/25/2013   gleason 3+3=6, volume 30 gm   Ulcer    hx of gastric   Past Surgical History:  Procedure Laterality Date   ACDCF  09/29/2020   ANTERIOR CERVICAL DECOMPRESSION/DISCECTOMY FUSION 4 LEVELS N/A 09/29/2020   Procedure: ANTERIOR CERVICAL DECOMPRESSION FUSION CERVICAL 3-CERVICAL 4 , CERVICAL 4 - CERVICAL 5, CERVICAL 5 - CERVICAL 6 WITH INSTRUMENTATION AND ALLOGRAFT;  Surgeon: DPhylliss Bob MD;  [place] [facility]  Service: Orthopedics;  Laterality: N/A;   CARDIAC CATHETERIZATION  03-23-2009  DR CBurt Knack  NORMAL  CORONARY ARTERIES   CHOLECYSTECTOMY N/A 11/06/2014   Procedure: LAPAROSCOPIC CHOLECYSTECTOMY WITH INTRAOPERATIVE CHOLANGIOGRAM;  Surgeon: Judeth Horn, MD;  Location: [place];  Service: General;  Laterality: N/A;   COLONOSCOPY  12/26/2011   Procedure: COLONOSCOPY;  Surgeon: Lear Ng, MD;  [facility];  Service: Endoscopy;  Laterality: N/A;   COLONOSCOPY     COLONOSCOPY WITH PROPOFOL N/A 07/29/2014   Procedure: COLONOSCOPY WITH PROPOFOL;  Surgeon: Lear Ng, MD;  Location: [place];  Service: Endoscopy;  Laterality: N/A;   CYSTOSCOPY N/A 05/18/2018   Procedure: CYSTOSCOPY REMOVAL FOREIGN BODY;  Surgeon: Kathie Rhodes, MD;   [facility];  Service: Urology;  Laterality: N/A;   CYSTOSCOPY/RETROGRADE/URETEROSCOPY Bilateral 04/25/2013   Procedure: CYSTOSCOPY/ BILATERAL RETROGRADES; BLADDER BIOPSIES;  Surgeon: Alexis Frock, MD;  [facility];  Service: Urology;  Laterality: Bilateral;   DENTAL EXAMINATION UNDER ANESTHESIA     ESOPHAGOGASTRODUODENOSCOPY  12/26/2011   Procedure: ESOPHAGOGASTRODUODENOSCOPY (EGD);  Surgeon: Lear Ng, MD;  [facility];  Service: Endoscopy;  Laterality: N/A;   ESOPHAGOGASTRODUODENOSCOPY (EGD) WITH PROPOFOL N/A 07/29/2014   Procedure: ESOPHAGOGASTRODUODENOSCOPY (EGD) WITH PROPOFOL;  Surgeon: Lear Ng, MD;  Location: [place];  Service: Endoscopy;  Laterality: N/A;   EXCISION CHRONIC LEFT BREAST ABSCESS  09-21-2010   EXCISIONAL BX LEFT BREAST MASS/  I  &  D LEFT BREAST ABSCESS  03-24-2009   KNEE ARTHROSCOPY Right 1985   left axilla biopsy  04/2013   left hip biopsy  10/2012   LYMPHADENECTOMY Bilateral 08/18/2013   Procedure: LYMPHADENECTOMY "PELVIC LYMPH NODE DISSECTION";  Surgeon: Alexis Frock, MD;  [facility];  Service: Urology;  Laterality: Bilateral;   PROSTATE BIOPSY N/A 04/25/2013   Procedure: BIOPSY TRANSRECTAL ULTRASONIC PROSTATE (TUBP);  Surgeon: Alexis Frock, MD;  Location: [facility];  Service: Urology;  Laterality: N/A;   ROBOT ASSISTED LAPAROSCOPIC RADICAL PROSTATECTOMY N/A 08/18/2013   Procedure: ROBOTIC ASSISTED LAPAROSCOPIC RADICAL PROSTATECTOMY;  Surgeon: Alexis Frock, MD;  [facility];  Service: Urology;  Laterality: N/A;   TOTAL KNEE ARTHROPLASTY Right 08/01/2016   08/17/16   TOTAL KNEE ARTHROPLASTY Right 08/01/2016   Procedure: TOTAL KNEE ARTHROPLASTY;  Surgeon: Renette Butters, MD;  Location: [place];  Service: Orthopedics;  Laterality: Right;   UPPER GASTROINTESTINAL ENDOSCOPY     UPPER LEG SOFT TISSUE BIOPSY Left 2012   lthigh    reports that he quit smoking about 12 years  ago. His smoking use included cigars and cigarettes. He has a 34.50 pack-year smoking history. He has never used smokeless tobacco. He reports that he does not currently use alcohol. He reports that he does not use drugs. family history includes Asthma in his father and sister; Cancer in his father and maternal uncle; Diabetes in his father; Hypertension in his sister; Stroke in his father. Allergies  Allergen Reactions   Amoxapine Shortness Of Breath   Chocolate Shortness Of Breath and Other (See Comments)    Asthma attack, welts   Ciprofloxacin Anaphylaxis, Dermatitis, Itching, Nausea And Vomiting, Shortness Of Breath, Swelling and Hives   Descovy [Emtricitabine-Tenofovir Af] Shortness Of Breath    Nasuea, vomiiting, diarrhea, sob, whelps, throat closes   Doxycycline Hyclate Shortness Of Breath and Swelling   Genvoya [Elviteg-Cobic-Emtricit-Tenofaf] Shortness Of Breath    Nausea, vomitting, diarreha, sob, rash, throat closing   Morphine And Related Shortness Of Breath, Itching and Other (See Comments)     chest pain   Other Anaphylaxis   Oxycodone-Acetaminophen Shortness Of Breath  Penicillins Shortness Of Breath and Rash    Has patient had a PCN reaction causing immediate rash, facial/tongue/throat swelling, SOB or lightheadedness with hypotension: Yes Has patient had a PCN reaction causing severe rash involving mucus membranes or skin necrosis: No Has patient had a PCN reaction that required hospitalization No Has patient had a PCN reaction occurring within the last 10 years: Yes If all of the above answers are "NO", then may proceed with Cephalosporin use.   Sulfa Antibiotics Anaphylaxis and Other (See Comments)   Tomato Shortness Of Breath   Vancomycin Shortness Of Breath and Other (See Comments)   Darunavir Itching   Amlodipine Other (See Comments)    swelling   Carvedilol     LEG SWELLING, DIZZNESS   Flagyl [Metronidazole] Itching   Milk-Related Compounds    Morphine Other  (See Comments)   Penicillin G Other (See Comments)   Toradol [Ketorolac Tromethamine] Nausea And Vomiting    itching   Acetaminophen Rash and Other (See Comments)    Pt tolerates percocet and also plain APAP without rash   Tramadol Itching, Nausea And Vomiting and Other (See Comments)   Current Outpatient Medications on File Prior to Visit  Medication Sig Dispense Refill   albuterol (PROVENTIL) (2.5 MG/3ML) 0.083% nebulizer solution Take 3 mLs (2.5 mg total) by nebulization every 6 (six) hours as needed for wheezing or shortness of breath. 75 mL 3   ALPRAZolam (XANAX) 0.5 MG tablet TAKE 1 TABLET(0.5 MG) BY MOUTH TWICE DAILY AS NEEDED FOR SLEEP 60 tablet 2   Beclomethasone Dipropionate (QNASL) 80 MCG/ACT AERS Place 2 sprays into both nostrils daily. 10.6 g 2   BIKTARVY 50-200-25 MG TABS tablet TAKE 1 TABLET BY MOUTH DAILY 30 tablet 11   BREZTRI AEROSPHERE 160-9-4.8 MCG/ACT AERO INHALE 2 PUFFS INTO THE LUNGS IN THE MORNING AND AT BEDTIME 10.7 g 0   cetirizine (ZYRTEC) 10 MG tablet Take 1 tablet (10 mg total) by mouth daily. 90 tablet 3   Cholecalciferol (VITAMIN D3) 250 MCG (10000 UT) capsule Take 1 tablet by mouth daily. [employer]     diphenhydrAMINE (BENADRYL) 25 MG tablet Take 1 tablet (25 mg total) by mouth every 6 (six) hours as needed. (Patient taking differently: Take 25 mg by mouth every 6 (six) hours as needed for sleep, itching or allergies.) 30 tablet 0   dorzolamide (TRUSOPT) 2 % ophthalmic solution 1 drop 2 (two) times daily.     EPINEPHrine 0.3 mg/0.3 mL IJ SOAJ injection Inject 0.3 mg into the muscle as needed for anaphylaxis. 1 each 5   famotidine (PEPCID) 40 MG tablet Take 1 tablet (40 mg total) by mouth daily. 30 tablet 5   gabapentin (NEURONTIN) 300 MG capsule TAKE 1 CAPSULE(300 MG) BY MOUTH FOUR TIMES DAILY AS NEEDED FOR PAIN 120 capsule 0   Glucos-Chond-Hyal Ac-Ca Fructo (MOVE FREE JOINT HEALTH ADVANCE PO) Take 1 tablet by mouth daily.     hydrALAZINE (APRESOLINE) 50 MG  tablet Take 50 mg by mouth 2 (two) times daily.     HYDROcodone-acetaminophen (NORCO/VICODIN) 5-325 MG tablet Take 1 tablet by mouth every 6 (six) hours as needed.     hydrOXYzine (ATARAX) 50 MG tablet Take 1 tablet (50 mg total) by mouth 2 (two) times daily. 60 tablet 1   meclizine (ANTIVERT) 25 MG tablet Take 1 tablet (25 mg total) by mouth daily as needed. 40 tablet 5   methocarbamol (ROBAXIN) 500 MG tablet Take 500 mg by mouth 4 (four) times daily.  metoprolol succinate (TOPROL-XL) 100 MG 24 hr tablet TAKE 1 TABLET(100 MG) BY MOUTH DAILY WITH OR IMMEDIATELY FOLLOWING A MEAL 90 tablet 3   montelukast (SINGULAIR) 10 MG tablet Take 10 mg by mouth daily.     Multiple Vitamin (MULTIVITAMIN WITH MINERALS) TABS tablet Take 1 tablet by mouth daily after breakfast.      mupirocin ointment (BACTROBAN) 2 % APPLY EXTERNALLY TO THE AFFECTED AREA TWICE DAILY FOR 3 WEEKS, PLACE COTTON BETWEEN TOES TO ALLOW BETTER AERATION AS NEEDED (Patient taking differently: Apply 1 application  topically See admin instructions. APPLY EXTERNALLY TO THE AFFECTED AREA TWICE DAILY FOR 3 WEEKS, PLACE COTTON BETWEEN TOES TO ALLOW BETTER AERATION AS NEEDED) 66 g 1   nitroGLYCERIN (NITROSTAT) 0.4 MG SL tablet Place 1 tablet (0.4 mg total) under the tongue every 5 (five) minutes as needed for chest pain. 30 tablet 1   ondansetron (ZOFRAN-ODT) 4 MG disintegrating tablet Take 1 tablet (4 mg total) by mouth every 8 (eight) hours as needed for nausea or vomiting. 30 tablet 2   OVER THE COUNTER MEDICATION Take 2 capsules by mouth daily. Zhou: thyroid supplement     pantoprazole (PROTONIX) 40 MG tablet Take 1 tablet (40 mg total) by mouth daily. 90 tablet 3   rosuvastatin (CRESTOR) 20 MG tablet Take 1 tablet (20 mg total) by mouth daily. 90 tablet 3   tizanidine (ZANAFLEX) 6 MG capsule TAKE 1 CAPSULE(6 MG) BY MOUTH THREE TIMES DAILY 90 capsule 11   torsemide (DEMADEX) 20 MG tablet Take 10 mg by mouth See admin instructions. Tuesday  Thursday Saturday     Ubrogepant (UBRELVY) 100 MG TABS Take 100 mg by mouth 2 (two) times daily as needed (can take a seecond dose 2 hiurs after first do not exceed 2 a day.). 10 tablet 5   VENTOLIN HFA 108 (90 Base) MCG/ACT inhaler INHALE 2 PUFFS INTO THE LUNGS EVERY 6 HOURS AS NEEDED FOR WHEEZING OR SHORTNESS OF BREATH 36 g 3   [DISCONTINUED] zolpidem (AMBIEN) 5 MG tablet Take 1-2 tablets (5-10 mg total) by mouth at bedtime as needed for sleep. (Patient not taking: Reported on 04/23/2019) 30 tablet 2   No current facility-administered medications on file prior to visit.        ROS:  All others reviewed and negative.  Objective        PE:  BP 134/82 (BP Location: Left Arm, Patient Position: Sitting, Cuff Size: Large)   Pulse 70   Temp 98.8 F (37.1 C) (Oral)   Ht 5' 10"$  (1.778 m)   Wt 209 lb (94.8 kg)   SpO2 94%   BMI 29.99 kg/m                 Constitutional: Pt appears in NAD               HENT: Head: NCAT.                Right Ear: External ear normal.  ; left canal with trace to 1+ redness, swelling and small yellowish d/c               Left Ear: External ear normal. Bilat tm's with mild erythema.  Max sinus areas non tender.  Pharynx with mild erythema, no exudate               Eyes: . Pupils are equal, round, and reactive to light. Conjunctivae and EOM are normal  Nose: without d/c or deformity               Neck: Neck supple. Gross normal ROM               Cardiovascular: Normal rate and regular rhythm.                 Pulmonary/Chest: Effort normal and breath sounds without rales or wheezing.                Abd:  Soft, NT, ND, + BS, no organomegaly               Neurological: Pt is alert. At baseline orientation, motor grossly intact               Skin: Skin is warm. No rashes, no other new lesions, LE edema - none               Psychiatric: Pt behavior is normal without agitation   Micro: none  Cardiac tracings I have personally interpreted today:  ECG -  NSR  Pertinent Radiological findings (summarize): none   Lab Results  Component Value Date   WBC 5.4 02/25/2022   HGB 12.1 (L) 02/25/2022   HCT 36.9 (L) 02/25/2022   PLT 184 02/25/2022   GLUCOSE 110 (H) 02/25/2022   CHOL 155 08/24/2021   TRIG 98.0 08/24/2021   HDL 46.40 08/24/2021   LDLCALC 89 08/24/2021   ALT 16 01/25/2022   AST 17 01/25/2022   NA 140 02/25/2022   K 4.1 02/25/2022   CL 110 02/25/2022   CREATININE 1.19 02/25/2022   BUN 10 02/25/2022   CO2 25 02/25/2022   TSH 1.48 08/24/2021   PSA 0.00 (L) 08/24/2021   INR 1.0 06/23/2021   HGBA1C <4.0 08/25/2021   MICROALBUR 16.88 (H) 10/05/2011   Assessment/Plan:  Casey Brennan is a 62 y.o. 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  Encounter for well adult exam with abnormal findings Age and sex appropriate education and counseling updated with regular exercise and diet Referrals for preventative services - none needed Immunizations addressed - declines tdap and covid booster Smoking counseling  - none needed Evidence for depression or other mood disorder - none significant Most recent labs reviewed. I have personally reviewed and have noted: 1) the patient's medical and social history 2) The patient's current medications and supplements 3) The patient's height, weight, and BMI have been recorded in the chart   Chronic renal insufficiency, stage 2 (mild) Lab Results  Component  Value Date   CREATININE 1.19 02/25/2022   Stable overall, cont to avoid nephrotoxins for f/u lab today  COPD GOLD III Stablei, cont inhaler prn  Essential hypertension BP Readings from Last 3 Encounters:  08/14/22 134/82  07/28/22 134/82  07/17/22 120/78   Stable, pt to continue medical treatment hdyralazine 50 bid, toprol xl 100 qd   H/O prostate cancer Asymptoatic for f/u psa, has urology f/u in about 3 mo  External otitis of left ear Mild to mod, for antibx course cortisporin  otic qid,  to f/u any worsening symptoms or concerns  Mixed hyperlipidemia Lab Results  Component Value Date   LDLCALC 89 08/24/2021   Stable, pt to continue current statin crestor 20 mg, for f/u lab   Sinusitis, chronic For ENT referral at [facility] per pt reqeust  Vitamin D deficiency Last vitamin D Lab Results  Component Value Date   VD25OH 42.22 08/24/2021   Stable, cont oral replacement  Followup: Return in about 6 months (around 02/12/2023).  Cathlean Cower, MD 08/14/2022 9:22 PM [facility]

## 2022-08-14 NOTE — Patient Instructions (Signed)
Please take all new medication as prescribed  - the ear drop antibiotics  Please continue all other medications as before, and refills have been done if requested.  Please have the pharmacy call with any other refills you may need.  Please continue your efforts at being more active, low cholesterol diet, and weight control.  You are otherwise up to date with prevention measures today.  Please keep your appointments with your specialists as you may have planned  You will be contacted regarding the referral for: ENT at [facility]  Please go to the LAB at the blood drawing area for the tests to be done  You will be contacted by phone if any changes need to be made immediately.  Otherwise, you will receive a letter about your results with an explanation, but please check with MyChart first.  Please remember to sign up for MyChart if you have not done so, as this will be important to you in the future with finding out test results, communicating by private email, and scheduling acute appointments online when needed.  Please make an Appointment to return in 6 months, or sooner if needed

## 2022-08-14 NOTE — Assessment & Plan Note (Signed)
Last vitamin D Lab Results  Component Value Date   VD25OH 42.22 08/24/2021   Stable, cont oral replacement

## 2022-08-15 ENCOUNTER — Telehealth: Payer: Self-pay | Admitting: Internal Medicine

## 2022-08-15 ENCOUNTER — Other Ambulatory Visit: Payer: 59

## 2022-08-15 DIAGNOSIS — R739 Hyperglycemia, unspecified: Secondary | ICD-10-CM | POA: Diagnosis not present

## 2022-08-15 DIAGNOSIS — G63 Polyneuropathy in diseases classified elsewhere: Secondary | ICD-10-CM | POA: Diagnosis not present

## 2022-08-15 DIAGNOSIS — M792 Neuralgia and neuritis, unspecified: Secondary | ICD-10-CM | POA: Diagnosis not present

## 2022-08-15 LAB — BASIC METABOLIC PANEL
BUN: 11 mg/dL (ref 6–23)
CO2: 28 mEq/L (ref 19–32)
Calcium: 9.3 mg/dL (ref 8.4–10.5)
Chloride: 103 mEq/L (ref 96–112)
Creatinine, Ser: 1.21 mg/dL (ref 0.40–1.50)
GFR: 65.67 mL/min (ref >60.00)
Glucose, Bld: 84 mg/dL (ref 70–99)
Potassium: 4.1 mEq/L (ref 3.5–5.1)
Sodium: 137 mEq/L (ref 135–145)

## 2022-08-15 LAB — VITAMIN B12: Vitamin B-12: 458 pg/mL (ref 211–911)

## 2022-08-15 LAB — URINALYSIS, ROUTINE W REFLEX MICROSCOPIC
Bilirubin Urine: NEGATIVE
Hgb urine dipstick: NEGATIVE
Ketones, ur: NEGATIVE
Leukocytes,Ua: NEGATIVE
Nitrite: POSITIVE — AB
RBC / HPF: NONE SEEN (ref 0–?)
Specific Gravity, Urine: 1.02 (ref 1.000–1.030)
Total Protein, Urine: 30 — AB
Urine Glucose: NEGATIVE
Urobilinogen, UA: 4 — AB (ref 0.0–1.0)
pH: 6 (ref 5.0–8.0)

## 2022-08-15 LAB — HEPATIC FUNCTION PANEL
ALT: 18 U/L (ref 0–53)
AST: 19 U/L (ref 0–37)
Albumin: 4.1 g/dL (ref 3.5–5.2)
Alkaline Phosphatase: 96 U/L (ref 39–117)
Bilirubin, Direct: 0.2 mg/dL (ref 0.0–0.3)
Total Bilirubin: 0.8 mg/dL (ref 0.2–1.2)
Total Protein: 6.9 g/dL (ref 6.0–8.3)

## 2022-08-15 LAB — TSH: TSH: 2.51 u[IU]/mL (ref 0.35–5.50)

## 2022-08-15 LAB — LIPID PANEL
Cholesterol: 139 mg/dL (ref 0–200)
HDL: 39.1 mg/dL (ref >39.00)
LDL Cholesterol: 80 mg/dL (ref 0–99)
NonHDL: 100.11
Total CHOL/HDL Ratio: 4
Triglycerides: 101 mg/dL (ref 0.0–149.0)
VLDL: 20.2 mg/dL (ref 0.0–40.0)

## 2022-08-15 LAB — VITAMIN D 25 HYDROXY (VIT D DEFICIENCY, FRACTURES): VITD: 27.12 ng/mL — ABNORMAL LOW (ref 30.00–100.00)

## 2022-08-15 LAB — PSA: PSA: 0 ng/mL — ABNORMAL LOW (ref 0.10–4.00)

## 2022-08-15 MED ORDER — HYDROCOD POLI-CHLORPHE POLI ER 10-8 MG/5ML PO SUER: 5.0000 mL | Freq: Two times a day (BID) | ORAL | 0 refills | Status: DC | PRN

## 2022-08-15 NOTE — Telephone Encounter (Signed)
Patient called and said that his cough syrup did not get sent in yesterday.  Please send it to [facility] on [address]

## 2022-08-15 NOTE — Telephone Encounter (Signed)
Notified pt MD sent rx to [facility]Marland KitchenJohny Chess

## 2022-08-15 NOTE — Telephone Encounter (Signed)
Ok done erx 

## 2022-08-16 ENCOUNTER — Telehealth: Payer: Self-pay | Admitting: Internal Medicine

## 2022-08-16 DIAGNOSIS — M5412 Radiculopathy, cervical region: Secondary | ICD-10-CM | POA: Diagnosis not present

## 2022-08-16 LAB — HEMOGLOBIN A1C: Hgb A1c MFr Bld: <4.2 % of total Hgb (ref <5.7)

## 2022-08-16 NOTE — Telephone Encounter (Signed)
Patient's sister called wanting to know if it is safe for her brother to take the ear drop medication neomycin-polymyxin-hydrocortisone (CORTISPORIN) OTIC solution because she states that the medication has sulfate in it and the patient is allergic to sulfur. Patient's sister who is POA is requesting a callback at [phone].

## 2022-08-16 NOTE — Telephone Encounter (Signed)
This is not the same as sulfer, so should be Ok to take, thanks

## 2022-08-16 NOTE — Telephone Encounter (Signed)
Patient has an to sulfa abx, please send another ear abx to the pharmacy

## 2022-08-17 NOTE — Telephone Encounter (Signed)
Patients sister informed and states he will start today

## 2022-08-23 ENCOUNTER — Telehealth: Payer: Self-pay | Admitting: Internal Medicine

## 2022-08-23 DIAGNOSIS — G959 Disease of spinal cord, unspecified: Secondary | ICD-10-CM

## 2022-08-23 NOTE — Telephone Encounter (Signed)
Patient needs a referral to Dr. Orson Ape -Neurologist - he is seeiing her but needs a new referral.

## 2022-08-24 ENCOUNTER — Ambulatory Visit: Payer: 59 | Admitting: Internal Medicine

## 2022-08-27 ENCOUNTER — Other Ambulatory Visit: Payer: Self-pay | Admitting: Internal Medicine

## 2022-08-27 NOTE — Telephone Encounter (Signed)
Please refill as per office routine med refill policy (all routine meds to be refilled for 3 mo or monthly (per pt preference) up to one year from last visit, then month to month grace period for 3 mo, then further med refills will have to be denied) ? ?

## 2022-09-05 ENCOUNTER — Ambulatory Visit: Payer: 59 | Admitting: Internal Medicine

## 2022-09-11 ENCOUNTER — Ambulatory Visit (INDEPENDENT_AMBULATORY_CARE_PROVIDER_SITE_OTHER): Payer: 59 | Admitting: Internal Medicine

## 2022-09-11 ENCOUNTER — Encounter: Payer: Self-pay | Admitting: Internal Medicine

## 2022-09-11 VITALS — BP 160/102 | HR 72 | Temp 98.4°F | Ht 70.0 in | Wt 205.0 lb

## 2022-09-11 DIAGNOSIS — J309 Allergic rhinitis, unspecified: Secondary | ICD-10-CM | POA: Diagnosis not present

## 2022-09-11 DIAGNOSIS — H6992 Unspecified Eustachian tube disorder, left ear: Secondary | ICD-10-CM | POA: Insufficient documentation

## 2022-09-11 DIAGNOSIS — R059 Cough, unspecified: Secondary | ICD-10-CM

## 2022-09-11 DIAGNOSIS — H6092 Unspecified otitis externa, left ear: Secondary | ICD-10-CM | POA: Diagnosis not present

## 2022-09-11 LAB — POC SOFIA SARS ANTIGEN FIA: SARS Coronavirus 2 Ag: NEGATIVE

## 2022-09-11 MED ORDER — METHYLPREDNISOLONE ACETATE 80 MG/ML IJ SUSP
80.0000 mg | Freq: Once | INTRAMUSCULAR | Status: AC
  Administered 2022-09-11: 80 mg via INTRAMUSCULAR

## 2022-09-11 MED ORDER — NEOMYCIN-POLYMYXIN-HC 3.5-10000-1 OT SOLN: 4.0000 [drp] | Freq: Four times a day (QID) | OTIC | 0 refills | Status: DC

## 2022-09-11 NOTE — Patient Instructions (Addendum)
You had the steroid shot today  Please take all new medication as prescribed - the left ear drop antibiotic  Please continue all other medications as before, including all the allergy meds  Please have the pharmacy call with any other refills you may need.  Please continue your efforts at being more active, low cholesterol diet, and weight control.  Please keep your appointments with your specialists as you may have planned

## 2022-09-11 NOTE — Progress Notes (Signed)
Patient ID: Casey Brennan, male   DOB: Aug 01, 1960, 62 y.o.   MRN: [id number]        Chief Complaint:  Chief Complaint  Patient presents with   Medical Management of Chronic Issues    Exposure to covid, wheezing, coughing up mucous   Ear Drainage  , allergies, left hearing muffled       HPI:  Casey Brennan is a 62 y.o. male here with c/o 3-4 days onset left ear pain with small d/c, also with tinnitus and muffled hearing and popping, crackling fullness.  Has some dizziness,,, also Does have several wks ongoing nasal allergy symptoms with clearish congestion, itch and sneezing, without fever, pain, ST, cough, swelling or wheezing.   Pt denies polydipsia, polyuria, or new focal neuro s/s.    Pt denies fever, wt loss, night sweats, loss of appetite, or other constitutional symptoms        Wt Readings from Last 3 Encounters:  09/11/22 205 lb (93 kg)  08/14/22 209 lb (94.8 kg)  07/28/22 205 lb (93 kg)   BP Readings from Last 3 Encounters:  09/11/22 (!) 160/102  08/14/22 134/82  07/28/22 134/82         Past Medical History:  Diagnosis Date   Asthma    very rare   Chronic anemia    normocytic   Chronic folliculitis    Environmental and seasonal allergies    H/O pericarditis    2010--  myopercarditis--  resolved   Headache(784.0)    HX SEVERE FRONTAL HA'S   Hiatal hernia    History of gastric ulcer    History of kidney stones    History of MRSA infection 2010   infected boil   HIV (human immunodeficiency virus infection) (Casey Brennan) 1988   Hypertension    Lytic bone lesion of hip    WORK-UP DONE BY ONCOLOGIST DR HA --  NOT MALIGNANT   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   Pneumonia    hx of   Post concussion syndrome    resolved   Prostate cancer ([place]) 04/25/2013   gleason 3+3=6, volume 30 gm   Ulcer    hx of gastric   Past Surgical History:  Procedure Laterality Date   ACDCF  09/29/2020   ANTERIOR CERVICAL DECOMPRESSION/DISCECTOMY FUSION 4 LEVELS N/A 09/29/2020    Procedure: ANTERIOR CERVICAL DECOMPRESSION FUSION CERVICAL 3-CERVICAL 4 , CERVICAL 4 - CERVICAL 5, CERVICAL 5 - CERVICAL 6 WITH INSTRUMENTATION AND ALLOGRAFT;  Surgeon: Phylliss Bob, MD;  Location: [place];  Service: Orthopedics;  Laterality: N/A;   CARDIAC CATHETERIZATION  03-23-2009  DR Brennan   NORMAL CORONARY ARTERIES   CHOLECYSTECTOMY N/A 11/06/2014   Procedure: LAPAROSCOPIC CHOLECYSTECTOMY WITH INTRAOPERATIVE CHOLANGIOGRAM;  Surgeon: Casey Horn, MD;  Location: [place];  Service: General;  Laterality: N/A;   COLONOSCOPY  12/26/2011   Procedure: COLONOSCOPY;  Surgeon: Casey Ng, MD;  Location: [facility];  Service: Endoscopy;  Laterality: N/A;   COLONOSCOPY     COLONOSCOPY WITH PROPOFOL N/A 07/29/2014   Procedure: COLONOSCOPY WITH PROPOFOL;  Surgeon: Casey Ng, MD;  Location: [place] [facility];  Service: Endoscopy;  Laterality: N/A;   CYSTOSCOPY N/A 05/18/2018   Procedure: CYSTOSCOPY REMOVAL FOREIGN BODY;  Surgeon: Casey Rhodes, MD;  Location: [facility];  Service: Urology;  Laterality: N/A;   CYSTOSCOPY/RETROGRADE/URETEROSCOPY Bilateral 04/25/2013   Procedure: CYSTOSCOPY/ BILATERAL RETROGRADES; BLADDER BIOPSIES;  Surgeon: Casey Frock, MD;  Location: [facility];  Service: Urology;  Laterality: Bilateral;  DENTAL EXAMINATION UNDER ANESTHESIA     ESOPHAGOGASTRODUODENOSCOPY  12/26/2011   Procedure: ESOPHAGOGASTRODUODENOSCOPY (EGD);  Surgeon: Casey Ng, MD;  Location: [facility];  Service: Endoscopy;  Laterality: N/A;   ESOPHAGOGASTRODUODENOSCOPY (EGD) WITH PROPOFOL N/A 07/29/2014   Procedure: ESOPHAGOGASTRODUODENOSCOPY (EGD) WITH PROPOFOL;  Surgeon: Casey Ng, MD;  Location: [place];  Service: Endoscopy;  Laterality: N/A;   EXCISION CHRONIC LEFT BREAST ABSCESS  09-21-2010   EXCISIONAL BX LEFT BREAST MASS/  I  &  D LEFT BREAST ABSCESS  03-24-2009   KNEE ARTHROSCOPY Right 1985   left axilla biopsy  04/2013   left hip biopsy  10/2012    LYMPHADENECTOMY Bilateral 08/18/2013   Procedure: LYMPHADENECTOMY "PELVIC LYMPH NODE DISSECTION";  Surgeon: Casey Frock, MD;  Location: [facility];  Service: Urology;  Laterality: Bilateral;   PROSTATE BIOPSY N/A 04/25/2013   Procedure: BIOPSY TRANSRECTAL ULTRASONIC PROSTATE (TUBP);  Surgeon: Casey Frock, MD;  Location: [facility];  Service: Urology;  Laterality: N/A;   ROBOT ASSISTED LAPAROSCOPIC RADICAL PROSTATECTOMY N/A 08/18/2013   Procedure: ROBOTIC ASSISTED LAPAROSCOPIC RADICAL PROSTATECTOMY;  Surgeon: Casey Frock, MD;  Location: [facility];  Service: Urology;  Laterality: N/A;   TOTAL KNEE ARTHROPLASTY Right 08/01/2016   08/17/16   TOTAL KNEE ARTHROPLASTY Right 08/01/2016   Procedure: TOTAL KNEE ARTHROPLASTY;  Surgeon: Casey Butters, MD;  Location: [place];  Service: Orthopedics;  Laterality: Right;   UPPER GASTROINTESTINAL ENDOSCOPY     UPPER LEG SOFT TISSUE BIOPSY Left 2012   lthigh    reports that he quit smoking about 12 years ago. His smoking use included cigars and cigarettes. He has a 34.50 pack-year smoking history. He has never used smokeless tobacco. He reports that he does not currently use alcohol. He reports that he does not use drugs. family history includes Asthma in his father and sister; Cancer in his father and maternal uncle; Diabetes in his father; Hypertension in his sister; Stroke in his father. Allergies  Allergen Reactions   Amoxapine Shortness Of Breath   Chocolate Shortness Of Breath and Other (See Comments)    Asthma attack, welts   Ciprofloxacin Anaphylaxis, Dermatitis, Itching, Nausea And Vomiting, Shortness Of Breath, Swelling and Hives   Descovy [Emtricitabine-Tenofovir Af] Shortness Of Breath    Nasuea, vomiiting, diarrhea, sob, whelps, throat closes   Doxycycline Hyclate Shortness Of Breath and Swelling   Genvoya [Elviteg-Cobic-Emtricit-Tenofaf] Shortness Of Breath    Nausea, vomitting, diarreha, sob, rash, throat closing   Morphine And  Related Shortness Of Breath, Itching and Other (See Comments)     chest pain   Other Anaphylaxis   Oxycodone-Acetaminophen Shortness Of Breath   Penicillins Shortness Of Breath and Rash    Has patient had a PCN reaction causing immediate rash, facial/tongue/throat swelling, SOB or lightheadedness with hypotension: Yes Has patient had a PCN reaction causing severe rash involving mucus membranes or skin necrosis: No Has patient had a PCN reaction that required hospitalization No Has patient had a PCN reaction occurring within the last 10 years: Yes If all of the above answers are "NO", then may proceed with Cephalosporin use.   Sulfa Antibiotics Anaphylaxis and Other (See Comments)   Tomato Shortness Of Breath   Vancomycin Shortness Of Breath and Other (See Comments)   Darunavir Itching   Amlodipine Other (See Comments)    swelling   Carvedilol     LEG SWELLING, DIZZNESS   Flagyl [Metronidazole] Itching   Milk-Related Compounds    Morphine Other (See Comments)  Penicillin G Other (See Comments)   Toradol [Ketorolac Tromethamine] Nausea And Vomiting    itching   Acetaminophen Rash and Other (See Comments)    Pt tolerates percocet and also plain APAP without rash   Tramadol Itching, Nausea And Vomiting and Other (See Comments)   Current Outpatient Medications on File Prior to Visit  Medication Sig Dispense Refill   albuterol (PROVENTIL) (2.5 MG/3ML) 0.083% nebulizer solution Take 3 mLs (2.5 mg total) by nebulization every 6 (six) hours as needed for wheezing or shortness of breath. 75 mL 3   ALPRAZolam (XANAX) 0.5 MG tablet TAKE 1 TABLET(0.5 MG) BY MOUTH TWICE DAILY AS NEEDED FOR SLEEP 60 tablet 2   Beclomethasone Dipropionate (QNASL) 80 MCG/ACT AERS Place 2 sprays into both nostrils daily. 10.6 g 2   BIKTARVY 50-200-25 MG TABS tablet TAKE 1 TABLET BY MOUTH DAILY 30 tablet 11   BREZTRI AEROSPHERE 160-9-4.8 MCG/ACT AERO INHALE 2 PUFFS INTO THE LUNGS IN THE MORNING AND AT BEDTIME 10.7 g  0   cetirizine (ZYRTEC) 10 MG tablet Take 1 tablet (10 mg total) by mouth daily. 90 tablet 3   chlorpheniramine-HYDROcodone (TUSSIONEX) 10-8 MG/5ML Take 5 mLs by mouth every 12 (twelve) hours as needed for cough. 115 mL 0   Cholecalciferol (VITAMIN D3) 250 MCG (10000 UT) capsule Take 1 tablet by mouth daily. [employer]     diphenhydrAMINE (BENADRYL) 25 MG tablet Take 1 tablet (25 mg total) by mouth every 6 (six) hours as needed. (Patient taking differently: Take 25 mg by mouth every 6 (six) hours as needed for sleep, itching or allergies.) 30 tablet 0   dorzolamide (TRUSOPT) 2 % ophthalmic solution 1 drop 2 (two) times daily.     EPINEPHrine 0.3 mg/0.3 mL IJ SOAJ injection Inject 0.3 mg into the muscle as needed for anaphylaxis. 1 each 5   famotidine (PEPCID) 40 MG tablet Take 1 tablet (40 mg total) by mouth daily. 30 tablet 5   gabapentin (NEURONTIN) 300 MG capsule TAKE 1 CAPSULE(300 MG) BY MOUTH FOUR TIMES DAILY AS NEEDED FOR PAIN 120 capsule 0   Glucos-Chond-Hyal Ac-Ca Fructo (MOVE FREE JOINT HEALTH ADVANCE PO) Take 1 tablet by mouth daily.     hydrALAZINE (APRESOLINE) 50 MG tablet Take 50 mg by mouth 2 (two) times daily.     HYDROcodone-acetaminophen (NORCO/VICODIN) 5-325 MG tablet Take 1 tablet by mouth every 6 (six) hours as needed.     hydrOXYzine (ATARAX) 50 MG tablet Take 1 tablet (50 mg total) by mouth 2 (two) times daily. 60 tablet 1   meclizine (ANTIVERT) 25 MG tablet Take 1 tablet (25 mg total) by mouth daily as needed. 40 tablet 5   methocarbamol (ROBAXIN) 500 MG tablet Take 500 mg by mouth 4 (four) times daily.     metoprolol succinate (TOPROL-XL) 100 MG 24 hr tablet TAKE 1 TABLET(100 MG) BY MOUTH DAILY WITH OR IMMEDIATELY FOLLOWING A MEAL 90 tablet 3   montelukast (SINGULAIR) 10 MG tablet Take 10 mg by mouth daily.     Multiple Vitamin (MULTIVITAMIN WITH MINERALS) TABS tablet Take 1 tablet by mouth daily after breakfast.      mupirocin ointment (BACTROBAN) 2 % APPLY EXTERNALLY TO  THE AFFECTED AREA TWICE DAILY FOR 3 WEEKS, PLACE COTTON BETWEEN TOES TO ALLOW BETTER AERATION AS NEEDED (Patient taking differently: Apply 1 application  topically See admin instructions. APPLY EXTERNALLY TO THE AFFECTED AREA TWICE DAILY FOR 3 WEEKS, PLACE COTTON BETWEEN TOES TO ALLOW BETTER AERATION AS NEEDED) 66 g  1   nitroGLYCERIN (NITROSTAT) 0.4 MG SL tablet Place 1 tablet (0.4 mg total) under the tongue every 5 (five) minutes as needed for chest pain. 30 tablet 1   ondansetron (ZOFRAN-ODT) 4 MG disintegrating tablet Take 1 tablet (4 mg total) by mouth every 8 (eight) hours as needed for nausea or vomiting. 30 tablet 2   OVER THE COUNTER MEDICATION Take 2 capsules by mouth daily. Zhou: thyroid supplement     pantoprazole (PROTONIX) 40 MG tablet Take 1 tablet (40 mg total) by mouth daily. 90 tablet 3   rosuvastatin (CRESTOR) 20 MG tablet TAKE 1 TABLET(20 MG) BY MOUTH DAILY 90 tablet 3   tizanidine (ZANAFLEX) 6 MG capsule TAKE 1 CAPSULE(6 MG) BY MOUTH THREE TIMES DAILY 90 capsule 11   torsemide (DEMADEX) 20 MG tablet Take 10 mg by mouth See admin instructions. Tuesday Thursday Saturday     Ubrogepant (UBRELVY) 100 MG TABS Take 100 mg by mouth 2 (two) times daily as needed (can take a seecond dose 2 hiurs after first do not exceed 2 a day.). 10 tablet 5   VENTOLIN HFA 108 (90 Base) MCG/ACT inhaler INHALE 2 PUFFS INTO THE LUNGS EVERY 6 HOURS AS NEEDED FOR WHEEZING OR SHORTNESS OF BREATH 36 g 3   [DISCONTINUED] zolpidem (AMBIEN) 5 MG tablet Take 1-2 tablets (5-10 mg total) by mouth at bedtime as needed for sleep. (Patient not taking: Reported on 04/23/2019) 30 tablet 2   No current facility-administered medications on file prior to visit.        ROS:  All others reviewed and negative.  Objective        PE:  BP (!) 160/102   Pulse 72   Temp 98.4 F (36.9 C) (Oral)   Ht '5\' 10"'$  (1.778 m)   Wt 205 lb (93 kg)   SpO2 93%   BMI 29.41 kg/m                 Constitutional: Pt appears in NAD                HENT: Head: NCAT.                Right Ear: External ear normal.                 Left Ear: External ear normal but with mild ear canal erythema,  Bilat tm's with mild erythema.  Max sinus areas non tender.  Pharynx with mild erythema, no exudate                Eyes: . Pupils are equal, round, and reactive to light. Conjunctivae and EOM are normal               Nose: without d/c or deformity               Neck: Neck supple. Gross normal ROM               Cardiovascular: Normal rate and regular rhythm.                 Pulmonary/Chest: Effort normal and breath sounds without rales or wheezing.                               Neurological: Pt is alert. At baseline orientation, motor grossly intact               Skin: Skin is warm. No  rashes, no other new lesions, LE edema - none               Psychiatric: Pt behavior is normal without agitation   Micro: none  Cardiac tracings I have personally interpreted today:  none  Pertinent Radiological findings (summarize): none   Lab Results  Component Value Date   WBC 6.4 08/14/2022   HGB 13.0 08/14/2022   HCT 39.7 08/14/2022   PLT 216.0 08/14/2022   GLUCOSE 84 08/14/2022   CHOL 139 08/14/2022   TRIG 101.0 08/14/2022   HDL 39.10 08/14/2022   LDLCALC 80 08/14/2022   ALT 18 08/14/2022   AST 19 08/14/2022   NA 137 08/14/2022   K 4.1 08/14/2022   CL 103 08/14/2022   CREATININE 1.21 08/14/2022   BUN 11 08/14/2022   CO2 28 08/14/2022   TSH 2.51 08/14/2022   PSA 0.00 (L) 08/14/2022   INR 1.0 06/23/2021   HGBA1C <4.2 08/15/2022   MICROALBUR 16.88 (H) 10/05/2011   POCT - COVID - negative  Assessment/Plan:  BOLESLAUS MORVAN is a 62 y.o. 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  Allergic rhinitis Mild to mod, for depomedrol 80 mg IM,  to f/u any worsening symptoms or concerns   External otitis of left ear Mild to mod, for topical antibx course cortisporin,,  to f/u any worsening symptoms or concerns  Acute dysfunction of left eustachian tube Also for mucinex bid prn  Followup: Return if symptoms worsen or fail to improve.  Cathlean Cower, MD 09/11/2022 8:09 PM [facility]

## 2022-09-11 NOTE — Assessment & Plan Note (Signed)
Also for mucinex bid prn 

## 2022-09-11 NOTE — Assessment & Plan Note (Signed)
Mild to mod, for topical antibx course cortisporin,,  to f/u any worsening symptoms or concerns

## 2022-09-11 NOTE — Assessment & Plan Note (Signed)
Mild to mod, for depomedrol 80 mg IM, to f/u any worsening symptoms or concerns 

## 2022-09-20 ENCOUNTER — Other Ambulatory Visit: Payer: Self-pay | Admitting: Pulmonary Disease

## 2022-09-20 ENCOUNTER — Other Ambulatory Visit: Payer: Self-pay | Admitting: Cardiology

## 2022-09-26 ENCOUNTER — Telehealth: Payer: Self-pay | Admitting: Internal Medicine

## 2022-09-26 DIAGNOSIS — H9202 Otalgia, left ear: Secondary | ICD-10-CM

## 2022-09-26 NOTE — Telephone Encounter (Signed)
Pt's sister calls and states that the referral to Dr Gaetana Michaelis office to have the pt's L ear drained needs to be called in because their fax machine is not working.   Please call [facility] to set up referral: [phone] or [phone]

## 2022-09-27 NOTE — Telephone Encounter (Signed)
[facility] referral don

## 2022-09-27 NOTE — Telephone Encounter (Signed)
Ok done

## 2022-09-27 NOTE — Telephone Encounter (Signed)
Per chart no referral has been placed for Dr. Conley Canal.Marland KitchenJohny Brennan

## 2022-10-02 ENCOUNTER — Telehealth: Payer: Self-pay | Admitting: Neurology

## 2022-10-02 NOTE — Telephone Encounter (Signed)
Pt sister called. Stated pt took two falls this weekend and he having head pain. She is requesting a sooner appointment.

## 2022-10-02 NOTE — Telephone Encounter (Signed)
Contacted pt sister back, stated he tripped going up the stares and hit his head. They did speak with PCP and PCP advised her to contact us to have a MRI ordered. I informed her we wouldn't be bale to order the MRI without seeing him and he hasn't been seen since April last year. He does have upcoming appt scheduled 10/26/22 to see MD. I advised to see if PCP can evaluate pt/order MRI first due to MD not having any sooner availability and that way MRI possibly could be completed prior to appt for Dr Dohmeier to review. She verbally understood and was appreciative.

## 2022-10-05 ENCOUNTER — Telehealth: Payer: Self-pay

## 2022-10-05 ENCOUNTER — Ambulatory Visit (INDEPENDENT_AMBULATORY_CARE_PROVIDER_SITE_OTHER): Payer: 59 | Admitting: Internal Medicine

## 2022-10-05 ENCOUNTER — Encounter: Payer: Self-pay | Admitting: Internal Medicine

## 2022-10-05 ENCOUNTER — Telehealth: Payer: Self-pay | Admitting: Internal Medicine

## 2022-10-05 VITALS — BP 122/74 | HR 84 | Temp 98.4°F | Ht 70.0 in | Wt 204.0 lb

## 2022-10-05 DIAGNOSIS — I10 Essential (primary) hypertension: Secondary | ICD-10-CM

## 2022-10-05 DIAGNOSIS — R948 Abnormal results of function studies of other organs and systems: Secondary | ICD-10-CM | POA: Diagnosis not present

## 2022-10-05 DIAGNOSIS — R051 Acute cough: Secondary | ICD-10-CM

## 2022-10-05 DIAGNOSIS — E559 Vitamin D deficiency, unspecified: Secondary | ICD-10-CM | POA: Diagnosis not present

## 2022-10-05 MED ORDER — METHYLPREDNISOLONE ACETATE 80 MG/ML IJ SUSP
80.0000 mg | Freq: Once | INTRAMUSCULAR | Status: AC
Start: 2022-10-05 — End: 2022-10-05
  Administered 2022-10-05: 80 mg via INTRAMUSCULAR

## 2022-10-05 MED ORDER — HYDROCOD POLI-CHLORPHE POLI ER 10-8 MG/5ML PO SUER: 5.0000 mL | Freq: Two times a day (BID) | ORAL | 0 refills | Status: DC | PRN

## 2022-10-05 MED ORDER — PREDNISONE 10 MG PO TABS
ORAL_TABLET | ORAL | 0 refills | Status: DC
Start: 2022-10-05 — End: 2022-10-26

## 2022-10-05 NOTE — Progress Notes (Signed)
Patient ID: Casey Brennan, male   DOB: 03/20/1961, 62 y.o.   MRN: [id number]        Chief Complaint: follow up cough x 4 mo, recent falls x 2, hx of abnormal PET scan, asthma exacerabation, htn, abnormal urine smell       HPI:  Casey Brennan is a 62 y.o. male here with c/o 4 mo worsening mild intemrittent non prod cough and wheezing, has seen allergy but worse in the past week, still working as truck Psychologist, clinical distance with travel frequently to different states across the country.  Pt has also fallen x 2 in the past month to knees with contusions now improved, has f/u with ortho soon, has neurology for late April but wife has been in contact and in light of possible lytic lesion seen on CT and PET scanning 2014, wife reports it was suggested per neurology to consider MRI head with and without cm, CT chest abd pelvis with and without cm.  Last oncology f/u was 2016.  Also with stonger smell urine recent but Denies urinary symptoms such as dysuria, frequency, urgency, flank pain, hematuria or n/v, fever, chills.         Wt Readings from Last 3 Encounters:  10/05/22 204 lb (92.5 kg)  09/11/22 205 lb (93 kg)  08/14/22 209 lb (94.8 kg)   BP Readings from Last 3 Encounters:  10/05/22 122/74  09/11/22 (!) 160/102  08/14/22 134/82         Past Medical History:  Diagnosis Date   Asthma    very rare   Chronic anemia    normocytic   Chronic folliculitis    Environmental and seasonal allergies    H/O pericarditis    2010--  myopercarditis--  resolved   Headache(784.0)    HX SEVERE FRONTAL HA'S   Hiatal hernia    History of gastric ulcer    History of kidney stones    History of MRSA infection 2010   infected boil   HIV (human immunodeficiency virus infection) 1988   Hypertension    Lytic bone lesion of hip    WORK-UP DONE BY ONCOLOGIST DR HA --  NOT MALIGNANT   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   Pneumonia    hx of   Post concussion syndrome    resolved   Prostate cancer  04/25/2013   gleason 3+3=6, volume 30 gm   Ulcer    hx of gastric   Past Surgical History:  Procedure Laterality Date   ACDCF  09/29/2020   ANTERIOR CERVICAL DECOMPRESSION/DISCECTOMY FUSION 4 LEVELS N/A 09/29/2020   Procedure: ANTERIOR CERVICAL DECOMPRESSION FUSION CERVICAL 3-CERVICAL 4 , CERVICAL 4 - CERVICAL 5, CERVICAL 5 - CERVICAL 6 WITH INSTRUMENTATION AND ALLOGRAFT;  Surgeon: Estill Bamberg, MD;  Location: [facility] OR;  Service: Orthopedics;  Laterality: N/A;   CARDIAC CATHETERIZATION  03-23-2009  DR COOPER   NORMAL CORONARY ARTERIES   CHOLECYSTECTOMY N/A 11/06/2014   Procedure: LAPAROSCOPIC CHOLECYSTECTOMY WITH INTRAOPERATIVE CHOLANGIOGRAM;  Surgeon: Jimmye Norman, MD;  Location: [facility] OR;  Service: General;  Laterality: N/A;   COLONOSCOPY  12/26/2011   Procedure: COLONOSCOPY;  Surgeon: Shirley Friar, MD;  Location: [facility] ENDOSCOPY;  Service: Endoscopy;  Laterality: N/A;   COLONOSCOPY     COLONOSCOPY WITH PROPOFOL N/A 07/29/2014   Procedure: COLONOSCOPY WITH PROPOFOL;  Surgeon: Shirley Friar, MD;  Location: [facility] ENDOSCOPY;  Service: Endoscopy;  Laterality: N/A;   CYSTOSCOPY N/A 05/18/2018   Procedure: CYSTOSCOPY REMOVAL FOREIGN BODY;  Surgeon:  Ihor Gullyttelin, Mark, MD;  Location: [facility] ORS;  Service: Urology;  Laterality: N/A;   CYSTOSCOPY/RETROGRADE/URETEROSCOPY Bilateral 04/25/2013   Procedure: CYSTOSCOPY/ BILATERAL RETROGRADES; BLADDER BIOPSIES;  Surgeon: Sebastian Acheheodore Manny, MD;  [facility];  Service: Urology;  Laterality: Bilateral;   DENTAL EXAMINATION UNDER ANESTHESIA     ESOPHAGOGASTRODUODENOSCOPY  12/26/2011   Procedure: ESOPHAGOGASTRODUODENOSCOPY (EGD);  Surgeon: Shirley FriarVincent C. Schooler, MD;  [facility];  Service: Endoscopy;  Laterality: N/A;   ESOPHAGOGASTRODUODENOSCOPY (EGD) WITH PROPOFOL N/A 07/29/2014   Procedure: ESOPHAGOGASTRODUODENOSCOPY (EGD) WITH PROPOFOL;  Surgeon: Shirley FriarVincent C. Schooler, MD;  [facility];  Service: Endoscopy;  Laterality: N/A;    EXCISION CHRONIC LEFT BREAST ABSCESS  09-21-2010   EXCISIONAL BX LEFT BREAST MASS/  I  &  D LEFT BREAST ABSCESS  03-24-2009   KNEE ARTHROSCOPY Right 1985   left axilla biopsy  04/2013   left hip biopsy  10/2012   LYMPHADENECTOMY Bilateral 08/18/2013   Procedure: LYMPHADENECTOMY "PELVIC LYMPH NODE DISSECTION";  Surgeon: Sebastian Acheheodore Manny, MD;  Location: [facility];  Service: Urology;  Laterality: Bilateral;   PROSTATE BIOPSY N/A 04/25/2013   Procedure: BIOPSY TRANSRECTAL ULTRASONIC PROSTATE (TUBP);  Surgeon: Sebastian Acheheodore Manny, MD;  [facility] [place];  Service: Urology;  Laterality: N/A;   ROBOT ASSISTED LAPAROSCOPIC RADICAL PROSTATECTOMY N/A 08/18/2013   Procedure: ROBOTIC ASSISTED LAPAROSCOPIC RADICAL PROSTATECTOMY;  Surgeon: Sebastian Acheheodore Manny, MD;  Location: [facility]S;  Service: Urology;  Laterality: N/A;   TOTAL KNEE ARTHROPLASTY Right 08/01/2016   08/17/16   TOTAL KNEE ARTHROPLASTY Right 08/01/2016   Procedure: TOTAL KNEE ARTHROPLASTY;  Surgeon: Sheral Apleyimothy D Murphy, MD;  Location: [facility] OR;  Service: Orthopedics;  Laterality: Right;   UPPER GASTROINTESTINAL ENDOSCOPY     UPPER LEG SOFT TISSUE BIOPSY Left 2012   lthigh    reports that he quit smoking about 12 years ago. His smoking use included cigars and cigarettes. He has a 34.50 pack-year smoking history. He has never used smokeless tobacco. He reports that he does not currently use alcohol. He reports that he does not use drugs. family history includes Asthma in his father and sister; Cancer in his father and maternal uncle; Diabetes in his father; Hypertension in his sister; Stroke in his father. Allergies  Allergen Reactions   Amoxapine Shortness Of Breath   Chocolate Shortness Of Breath and Other (See Comments)    Asthma attack, welts   Ciprofloxacin Anaphylaxis, Dermatitis, Itching, Nausea And Vomiting, Shortness Of Breath, Swelling and Hives   Descovy [Emtricitabine-Tenofovir Af] Shortness Of Breath    Nasuea, vomiiting, diarrhea, sob,  whelps, throat closes   Doxycycline Hyclate Shortness Of Breath and Swelling   Genvoya [Elviteg-Cobic-Emtricit-Tenofaf] Shortness Of Breath    Nausea, vomitting, diarreha, sob, rash, throat closing   Morphine And Related Shortness Of Breath, Itching and Other (See Comments)     chest pain   Other Anaphylaxis   Oxycodone-Acetaminophen Shortness Of Breath   Penicillins Shortness Of Breath and Rash    Has patient had a PCN reaction causing immediate rash, facial/tongue/throat swelling, SOB or lightheadedness with hypotension: Yes Has patient had a PCN reaction causing severe rash involving mucus membranes or skin necrosis: No Has patient had a PCN reaction that required hospitalization No Has patient had a PCN reaction occurring within the last 10 years: Yes If all of the above answers are "NO", then may proceed with Cephalosporin use.   Sulfa Antibiotics Anaphylaxis and Other (See Comments)   Tomato Shortness Of Breath   Vancomycin Shortness Of Breath and  Other (See Comments)   Darunavir Itching   Amlodipine Other (See Comments)    swelling   Carvedilol     LEG SWELLING, DIZZNESS   Flagyl [Metronidazole] Itching   Milk-Related Compounds    Morphine Other (See Comments)   Penicillin G Other (See Comments)   Toradol [Ketorolac Tromethamine] Nausea And Vomiting    itching   Acetaminophen Rash and Other (See Comments)    Pt tolerates percocet and also plain APAP without rash   Tramadol Itching, Nausea And Vomiting and Other (See Comments)   Current Outpatient Medications on File Prior to Visit  Medication Sig Dispense Refill   albuterol (PROVENTIL) (2.5 MG/3ML) 0.083% nebulizer solution Take 3 mLs (2.5 mg total) by nebulization every 6 (six) hours as needed for wheezing or shortness of breath. 75 mL 3   ALPRAZolam (XANAX) 0.5 MG tablet TAKE 1 TABLET(0.5 MG) BY MOUTH TWICE DAILY AS NEEDED FOR SLEEP 60 tablet 2   Beclomethasone Dipropionate (QNASL) 80 MCG/ACT AERS Place 2 sprays into both  nostrils daily. 10.6 g 2   BIKTARVY 50-200-25 MG TABS tablet TAKE 1 TABLET BY MOUTH DAILY 30 tablet 11   BREZTRI AEROSPHERE 160-9-4.8 MCG/ACT AERO INHALE 2 PUFFS INTO THE LUNGS IN THE MORNING AND AT BEDTIME 10.7 g 0   cetirizine (ZYRTEC) 10 MG tablet Take 1 tablet (10 mg total) by mouth daily. 90 tablet 3   Cholecalciferol (VITAMIN D3) 250 MCG (10000 UT) capsule Take 1 tablet by mouth daily. Nature's Made     diphenhydrAMINE (BENADRYL) 25 MG tablet Take 1 tablet (25 mg total) by mouth every 6 (six) hours as needed. (Patient taking differently: Take 25 mg by mouth every 6 (six) hours as needed for sleep, itching or allergies.) 30 tablet 0   dorzolamide (TRUSOPT) 2 % ophthalmic solution 1 drop 2 (two) times daily.     EPINEPHrine 0.3 mg/0.3 mL IJ SOAJ injection Inject 0.3 mg into the muscle as needed for anaphylaxis. 1 each 5   famotidine (PEPCID) 40 MG tablet Take 1 tablet (40 mg total) by mouth daily. 30 tablet 5   gabapentin (NEURONTIN) 300 MG capsule TAKE 1 CAPSULE(300 MG) BY MOUTH FOUR TIMES DAILY AS NEEDED FOR PAIN 120 capsule 0   Glucos-Chond-Hyal Ac-Ca Fructo (MOVE FREE JOINT HEALTH ADVANCE PO) Take 1 tablet by mouth daily.     hydrALAZINE (APRESOLINE) 50 MG tablet TAKE 1 TABLET(50 MG) BY MOUTH THREE TIMES DAILY 270 tablet 1   HYDROcodone-acetaminophen (NORCO/VICODIN) 5-325 MG tablet Take 1 tablet by mouth every 6 (six) hours as needed.     hydrOXYzine (ATARAX) 50 MG tablet Take 1 tablet (50 mg total) by mouth 2 (two) times daily. 60 tablet 1   meclizine (ANTIVERT) 25 MG tablet Take 1 tablet (25 mg total) by mouth daily as needed. 40 tablet 5   methocarbamol (ROBAXIN) 500 MG tablet Take 500 mg by mouth 4 (four) times daily.     metoprolol succinate (TOPROL-XL) 100 MG 24 hr tablet TAKE 1 TABLET(100 MG) BY MOUTH DAILY WITH OR IMMEDIATELY FOLLOWING A MEAL 90 tablet 3   montelukast (SINGULAIR) 10 MG tablet Take 10 mg by mouth daily.     Multiple Vitamin (MULTIVITAMIN WITH MINERALS) TABS tablet  Take 1 tablet by mouth daily after breakfast.      mupirocin ointment (BACTROBAN) 2 % APPLY EXTERNALLY TO THE AFFECTED AREA TWICE DAILY FOR 3 WEEKS, PLACE COTTON BETWEEN TOES TO ALLOW BETTER AERATION AS NEEDED (Patient taking differently: Apply 1 application  topically See admin  instructions. APPLY EXTERNALLY TO THE AFFECTED AREA TWICE DAILY FOR 3 WEEKS, PLACE COTTON BETWEEN TOES TO ALLOW BETTER AERATION AS NEEDED) 66 g 1   neomycin-polymyxin-hydrocortisone (CORTISPORIN) OTIC solution Place 4 drops into the left ear 4 (four) times daily. 10 mL 0   ondansetron (ZOFRAN-ODT) 4 MG disintegrating tablet Take 1 tablet (4 mg total) by mouth every 8 (eight) hours as needed for nausea or vomiting. 30 tablet 2   OVER THE COUNTER MEDICATION Take 2 capsules by mouth daily. Zhou: thyroid supplement     pantoprazole (PROTONIX) 40 MG tablet Take 1 tablet (40 mg total) by mouth daily. 90 tablet 3   rosuvastatin (CRESTOR) 20 MG tablet TAKE 1 TABLET(20 MG) BY MOUTH DAILY 90 tablet 3   tizanidine (ZANAFLEX) 6 MG capsule TAKE 1 CAPSULE(6 MG) BY MOUTH THREE TIMES DAILY 90 capsule 11   torsemide (DEMADEX) 20 MG tablet Take 10 mg by mouth See admin instructions. Tuesday Thursday Saturday     Ubrogepant (UBRELVY) 100 MG TABS Take 100 mg by mouth 2 (two) times daily as needed (can take a seecond dose 2 hiurs after first do not exceed 2 a day.). 10 tablet 5   VENTOLIN HFA 108 (90 Base) MCG/ACT inhaler INHALE 2 PUFFS INTO THE LUNGS EVERY 6 HOURS AS NEEDED FOR WHEEZING OR SHORTNESS OF BREATH 36 g 3   nitroGLYCERIN (NITROSTAT) 0.4 MG SL tablet Place 1 tablet (0.4 mg total) under the tongue every 5 (five) minutes as needed for chest pain. 30 tablet 1   [DISCONTINUED] zolpidem (AMBIEN) 5 MG tablet Take 1-2 tablets (5-10 mg total) by mouth at bedtime as needed for sleep. (Patient not taking: Reported on 04/23/2019) 30 tablet 2   No current facility-administered medications on file prior to visit.        ROS:  All others reviewed  and negative.  Objective        PE:  BP 122/74   Pulse 84   Temp 98.4 F (36.9 C) (Oral)   Ht 5\' 10"  (1.778 m)   Wt 204 lb (92.5 kg)   SpO2 92%   BMI 29.27 kg/m                 Constitutional: Pt appears in NAD, non toxic but occas cough               HENT: Head: NCAT.                Right Ear: External ear normal.                 Left Ear: External ear normal. Bilat tm's with mild erythema.  Max sinus areas non tender.  Pharynx with mild erythema, no exudate                 Eyes: . Pupils are equal, round, and reactive to light. Conjunctivae and EOM are normal               Nose: without d/c or deformity               Neck: Neck supple. Gross normal ROM               Cardiovascular: Normal rate and regular rhythm.                 Pulmonary/Chest: Effort normal and breath sounds without rales but with few bialteral wheezing.  Abd:  Soft, NT, ND, + BS, no organomegaly               Neurological: Pt is alert. At baseline orientation, motor grossly intact               Skin: Skin is warm. No rashes, no other new lesions, LE edema - none               Psychiatric: Pt behavior is normal without agitation   Micro: none  Cardiac tracings I have personally interpreted today:  none  Pertinent Radiological findings (summarize): none   Lab Results  Component Value Date   WBC 6.4 08/14/2022   HGB 13.0 08/14/2022   HCT 39.7 08/14/2022   PLT 216.0 08/14/2022   GLUCOSE 84 08/14/2022   CHOL 139 08/14/2022   TRIG 101.0 08/14/2022   HDL 39.10 08/14/2022   LDLCALC 80 08/14/2022   ALT 18 08/14/2022   AST 19 08/14/2022   NA 137 08/14/2022   K 4.1 08/14/2022   CL 103 08/14/2022   CREATININE 1.21 08/14/2022   BUN 11 08/14/2022   CO2 28 08/14/2022   TSH 2.51 08/14/2022   PSA 0.00 (L) 08/14/2022   INR 1.0 06/23/2021   HGBA1C <4.2 08/15/2022   MICROALBUR 16.88 (H) 10/05/2011   Assessment/Plan:  Casey Brennan is a 62 y.o. 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  Essential hypertension BP Readings from Last 3 Encounters:  10/05/22 122/74  09/11/22 (!) 160/102  08/14/22 134/82   Stable, pt to continue medical treatment hydralazine 50 tid, toprol xl 100 qd   Vitamin D deficiency Last vitamin D Lab Results  Component Value Date   VD25OH 27.12 (L) 08/14/2022   Low, to start oral replacement   Cough Likely non infectious related, likely related to copd - asthma exacerbation - for depomedrol 80 mg IM, medrol pack, continue inhaler prn,  to f/u any worsening symptoms or concerns  Abnormal positron emission tomography (PET) scan With recent worsening weakness, wt loss several lbs, fall x 2 and hx of abnormal PET scan - for MRI head with and without cm, as well as CT abd/pelvis/chest if ok with insurance, and f/u neurology as planned late April 2024, consider need for oncology f/u asa well  Followup: Return in about 3 months (around 01/04/2023).  Oliver Barre, MD 10/08/2022 5:55 AM [facility] Internal Medicine

## 2022-10-05 NOTE — Telephone Encounter (Signed)
Magda Paganini from [facility] called and asked for clarification on the imaging ordered for patient. A CT for chest/abdomen/pelvis was order twice. One was with contrast and the other was without. She would like to know if both need to be done or if he wants one over the other. Best call back for [facility] is [phone].

## 2022-10-05 NOTE — Telephone Encounter (Signed)
Done erx 

## 2022-10-05 NOTE — Patient Instructions (Addendum)
You had the steroid shot today  Please take all new medication as prescribed - the prednisone  You will be contacted regarding the referral for:  MRI brain, and CT chest abd and pelvis (all with and without contrast)  Please continue all other medications as before, and refills have been done if requested.  Please have the pharmacy call with any other refills you may need.  Please continue your efforts at being more active, low cholesterol diet, and weight control.  Please keep your appointments with your specialists as you may have planned - orthopedic for the knees, and Neurology for the head and neck pain and recurrent falls and abnormal PET scan in the past  Please go to the LAB at the blood drawing area for the tests to be done - because the kidneys have to be checked prior to any contrast  You will be contacted by phone if any changes need to be made immediately.  Otherwise, you will receive a letter about your results with an explanation, but please check with MyChart first.  Please remember to sign up for MyChart if you have not done so, as this will be important to you in the future with finding out test results, communicating by private email, and scheduling acute appointments online when needed.  Please make an Appointment to return in 3 months, or sooner if needed

## 2022-10-05 NOTE — Telephone Encounter (Signed)
Patient requesting a refill for tussonex

## 2022-10-06 ENCOUNTER — Other Ambulatory Visit: Payer: Self-pay | Admitting: Orthopedic Surgery

## 2022-10-06 ENCOUNTER — Telehealth: Payer: Self-pay | Admitting: Internal Medicine

## 2022-10-06 DIAGNOSIS — M5412 Radiculopathy, cervical region: Secondary | ICD-10-CM | POA: Diagnosis not present

## 2022-10-06 DIAGNOSIS — M25561 Pain in right knee: Secondary | ICD-10-CM | POA: Diagnosis not present

## 2022-10-06 DIAGNOSIS — M25512 Pain in left shoulder: Secondary | ICD-10-CM | POA: Diagnosis not present

## 2022-10-06 DIAGNOSIS — M25562 Pain in left knee: Secondary | ICD-10-CM | POA: Diagnosis not present

## 2022-10-06 DIAGNOSIS — M75121 Complete rotator cuff tear or rupture of right shoulder, not specified as traumatic: Secondary | ICD-10-CM

## 2022-10-06 DIAGNOSIS — M542 Cervicalgia: Secondary | ICD-10-CM

## 2022-10-06 NOTE — Telephone Encounter (Signed)
Both please, the EPIC does not have an order for CT chest/abd/pelvis w and wo CM that can be ordered all at one time, so I was forced to order like this,  thanks

## 2022-10-06 NOTE — Telephone Encounter (Signed)
Patient was seen yesterday, please advise.

## 2022-10-06 NOTE — Telephone Encounter (Signed)
Casey Brennan at [facility] informed and will contact the patient

## 2022-10-06 NOTE — Telephone Encounter (Signed)
Pt stated pharmacy never refilled the Tussonex.

## 2022-10-06 NOTE — Telephone Encounter (Signed)
Please advise and I will give an update to [facility]

## 2022-10-06 NOTE — Telephone Encounter (Signed)
Pt wanted to now if Dr. Jonny Ruiz could send a referral for a pet scan.   Pt also stated when he blows his nose he having a lot of yellow mucus.

## 2022-10-06 NOTE — Telephone Encounter (Signed)
Sorry no, I dont see the need at this time.  Please follow up with Neurology and the CT scans as ordered   thanks

## 2022-10-08 ENCOUNTER — Ambulatory Visit
Admission: RE | Admit: 2022-10-08 | Discharge: 2022-10-08 | Disposition: A | Discharge status: Home / Self Care | Payer: 59 | Source: Ambulatory Visit | Attending: Internal Medicine | Admitting: Internal Medicine

## 2022-10-08 ENCOUNTER — Encounter: Payer: Self-pay | Admitting: Internal Medicine

## 2022-10-08 DIAGNOSIS — R948 Abnormal results of function studies of other organs and systems: Secondary | ICD-10-CM

## 2022-10-08 DIAGNOSIS — R296 Repeated falls: Secondary | ICD-10-CM | POA: Diagnosis not present

## 2022-10-08 DIAGNOSIS — M4802 Spinal stenosis, cervical region: Secondary | ICD-10-CM | POA: Diagnosis not present

## 2022-10-08 MED ORDER — GADOPICLENOL 0.5 MMOL/ML IV SOLN
10.0000 mL | Freq: Once | INTRAVENOUS | Status: AC | PRN
  Administered 2022-10-08: 10 mL via INTRAVENOUS

## 2022-10-08 NOTE — Assessment & Plan Note (Signed)
With recent worsening weakness, wt loss several lbs, fall x 2 and hx of abnormal PET scan - for MRI head with and without cm, as well as CT abd/pelvis/chest if ok with insurance, and f/u neurology as planned late April 2024, consider need for oncology f/u asa well

## 2022-10-08 NOTE — Assessment & Plan Note (Signed)
Last vitamin D Lab Results  Component Value Date   VD25OH 27.12 (L) 08/14/2022   Low, to start oral replacement

## 2022-10-08 NOTE — Assessment & Plan Note (Signed)
BP Readings from Last 3 Encounters:  10/05/22 122/74  09/11/22 (!) 160/102  08/14/22 134/82   Stable, pt to continue medical treatment hydralazine 50 tid, toprol xl 100 qd

## 2022-10-08 NOTE — Assessment & Plan Note (Signed)
Likely non infectious related, likely related to copd - asthma exacerbation - for depomedrol 80 mg IM, medrol pack, continue inhaler prn,  to f/u any worsening symptoms or concerns

## 2022-10-09 ENCOUNTER — Other Ambulatory Visit: Payer: Self-pay | Admitting: Hematology and Oncology

## 2022-10-09 ENCOUNTER — Telehealth: Payer: Self-pay

## 2022-10-09 ENCOUNTER — Ambulatory Visit
Admission: RE | Admit: 2022-10-09 | Discharge: 2022-10-09 | Disposition: A | Discharge status: Home / Self Care | Payer: 59 | Source: Ambulatory Visit | Attending: Internal Medicine | Admitting: Internal Medicine

## 2022-10-09 ENCOUNTER — Ambulatory Visit
Admission: RE | Admit: 2022-10-09 | Discharge: 2022-10-09 | Disposition: A | Discharge status: Home / Self Care | Payer: 59 | Source: Ambulatory Visit | Attending: Orthopedic Surgery | Admitting: Orthopedic Surgery

## 2022-10-09 DIAGNOSIS — J479 Bronchiectasis, uncomplicated: Secondary | ICD-10-CM | POA: Diagnosis not present

## 2022-10-09 DIAGNOSIS — M4802 Spinal stenosis, cervical region: Secondary | ICD-10-CM | POA: Diagnosis not present

## 2022-10-09 DIAGNOSIS — C9000 Multiple myeloma not having achieved remission: Secondary | ICD-10-CM | POA: Diagnosis not present

## 2022-10-09 DIAGNOSIS — R948 Abnormal results of function studies of other organs and systems: Secondary | ICD-10-CM

## 2022-10-09 DIAGNOSIS — M5412 Radiculopathy, cervical region: Secondary | ICD-10-CM | POA: Diagnosis not present

## 2022-10-09 DIAGNOSIS — M542 Cervicalgia: Secondary | ICD-10-CM

## 2022-10-09 DIAGNOSIS — R591 Generalized enlarged lymph nodes: Secondary | ICD-10-CM | POA: Diagnosis not present

## 2022-10-09 DIAGNOSIS — J432 Centrilobular emphysema: Secondary | ICD-10-CM | POA: Diagnosis not present

## 2022-10-09 DIAGNOSIS — M4312 Spondylolisthesis, cervical region: Secondary | ICD-10-CM | POA: Diagnosis not present

## 2022-10-09 DIAGNOSIS — R200 Anesthesia of skin: Secondary | ICD-10-CM | POA: Diagnosis not present

## 2022-10-09 DIAGNOSIS — M75121 Complete rotator cuff tear or rupture of right shoulder, not specified as traumatic: Secondary | ICD-10-CM

## 2022-10-09 DIAGNOSIS — M25512 Pain in left shoulder: Secondary | ICD-10-CM | POA: Diagnosis not present

## 2022-10-09 DIAGNOSIS — N289 Disorder of kidney and ureter, unspecified: Secondary | ICD-10-CM | POA: Diagnosis not present

## 2022-10-09 DIAGNOSIS — K429 Umbilical hernia without obstruction or gangrene: Secondary | ICD-10-CM | POA: Diagnosis not present

## 2022-10-09 MED ORDER — IOPAMIDOL (ISOVUE-300) INJECTION 61%
100.0000 mL | Freq: Once | INTRAVENOUS | Status: AC | PRN
  Administered 2022-10-09: 100 mL via INTRAVENOUS

## 2022-10-09 NOTE — Telephone Encounter (Signed)
Returned call to sister requesting PET scan for her brother. Told her per Dr. Bertis Ruddy, PCP ordered CT. If the results come back abnormal Dr. Bertis Ruddy will see Alinda Money. She cannot order PET scan without seeing Alinda Money. Sister verbalized understanding and said that PCP is sending a referral.

## 2022-10-09 NOTE — Telephone Encounter (Signed)
Pt call back wanted a phone call back about the petscan I read the message that was left in the messages about no need for a pet scan at this time but pt sister was confuse and pt sister is also inquiring about a referral to the cancer center at [facility]

## 2022-10-09 NOTE — Telephone Encounter (Signed)
Pt wife called back stated pt never received his Tussonex

## 2022-10-09 NOTE — Telephone Encounter (Signed)
Spoke with patients sister regarding request for pet scan which is not recommended by pcp.

## 2022-10-10 ENCOUNTER — Encounter: Payer: Self-pay | Admitting: Orthopedic Surgery

## 2022-10-10 ENCOUNTER — Other Ambulatory Visit: Payer: Self-pay | Admitting: Internal Medicine

## 2022-10-10 ENCOUNTER — Ambulatory Visit
Admission: RE | Admit: 2022-10-10 | Discharge: 2022-10-10 | Disposition: A | Discharge status: Home / Self Care | Payer: 59 | Source: Ambulatory Visit | Attending: Orthopedic Surgery | Admitting: Orthopedic Surgery

## 2022-10-10 ENCOUNTER — Telehealth: Payer: Self-pay | Admitting: Internal Medicine

## 2022-10-10 DIAGNOSIS — R933 Abnormal findings on diagnostic imaging of other parts of digestive tract: Secondary | ICD-10-CM

## 2022-10-10 DIAGNOSIS — M542 Cervicalgia: Secondary | ICD-10-CM

## 2022-10-10 DIAGNOSIS — K6289 Other specified diseases of anus and rectum: Secondary | ICD-10-CM

## 2022-10-10 DIAGNOSIS — M4802 Spinal stenosis, cervical region: Secondary | ICD-10-CM | POA: Diagnosis not present

## 2022-10-10 NOTE — Telephone Encounter (Signed)
Inbound call from patient sister, she wants to know if there is something available sooner, he is having swallowing problems.Please advise

## 2022-10-11 ENCOUNTER — Other Ambulatory Visit: Payer: Self-pay

## 2022-10-11 DIAGNOSIS — M25512 Pain in left shoulder: Secondary | ICD-10-CM | POA: Diagnosis not present

## 2022-10-11 DIAGNOSIS — R1310 Dysphagia, unspecified: Secondary | ICD-10-CM

## 2022-10-11 NOTE — Telephone Encounter (Signed)
Patient has severe cervical neck/disc disease  I would recommend we do a barium swallow with tablet first and then we can decide about office visit and possible endoscopy JMP

## 2022-10-11 NOTE — Telephone Encounter (Signed)
Pts sister states that pt had surgery on his neck and since that surgery he has been having issues with swallowing and food feeling like it gets stuck in his throat. They spoke with surgeon and were told sometimes this happens, PCP told them to contact GI for possible dilation. Offered an appt with Quentin Mulling PA for 11/01/22 at 1:30pm but pt wants to wait to see Dr. Rhea Belton. Pts sister wanted to make sure Dr. Rhea Belton was aware. Note sent to Dr. Rhea Belton.

## 2022-10-11 NOTE — Telephone Encounter (Signed)
Pt scheduled for barium swallow at [facility] 10/20/22 at 10am, 9:45am arrival time. Pt to be NPO 3 hr prior to exam. Pts sister given the phone number to call [phone] to see if they can get a sooner appt.

## 2022-10-12 ENCOUNTER — Telehealth: Payer: Self-pay | Admitting: Internal Medicine

## 2022-10-12 ENCOUNTER — Ambulatory Visit: Payer: PRIVATE HEALTH INSURANCE

## 2022-10-12 NOTE — Telephone Encounter (Signed)
Inbound call from patient sister .Marland Kitchenwanted to let Dr.Pyrtle know that she was able to schedule her brother for the swallowing test next Wednesday Thanks

## 2022-10-16 DIAGNOSIS — M5412 Radiculopathy, cervical region: Secondary | ICD-10-CM | POA: Diagnosis not present

## 2022-10-17 ENCOUNTER — Encounter: Payer: Self-pay | Admitting: Internal Medicine

## 2022-10-17 ENCOUNTER — Ambulatory Visit (INDEPENDENT_AMBULATORY_CARE_PROVIDER_SITE_OTHER): Payer: 59 | Admitting: Internal Medicine

## 2022-10-17 VITALS — BP 130/76 | HR 70 | Temp 98.4°F | Ht 70.0 in | Wt 211.0 lb

## 2022-10-17 DIAGNOSIS — J479 Bronchiectasis, uncomplicated: Secondary | ICD-10-CM | POA: Diagnosis not present

## 2022-10-17 DIAGNOSIS — Z9109 Other allergy status, other than to drugs and biological substances: Secondary | ICD-10-CM | POA: Diagnosis not present

## 2022-10-17 DIAGNOSIS — J309 Allergic rhinitis, unspecified: Secondary | ICD-10-CM | POA: Diagnosis not present

## 2022-10-17 DIAGNOSIS — N2889 Other specified disorders of kidney and ureter: Secondary | ICD-10-CM

## 2022-10-17 DIAGNOSIS — J449 Chronic obstructive pulmonary disease, unspecified: Secondary | ICD-10-CM | POA: Diagnosis not present

## 2022-10-17 DIAGNOSIS — I10 Essential (primary) hypertension: Secondary | ICD-10-CM | POA: Diagnosis not present

## 2022-10-17 DIAGNOSIS — R948 Abnormal results of function studies of other organs and systems: Secondary | ICD-10-CM | POA: Diagnosis not present

## 2022-10-17 LAB — BASIC METABOLIC PANEL
BUN: 20 mg/dL (ref 6–23)
CO2: 30 mEq/L (ref 19–32)
Calcium: 8.7 mg/dL (ref 8.4–10.5)
Chloride: 104 mEq/L (ref 96–112)
Creatinine, Ser: 1.2 mg/dL (ref 0.40–1.50)
GFR: 66.25 mL/min (ref >60.00)
Glucose, Bld: 91 mg/dL (ref 70–99)
Potassium: 4.2 mEq/L (ref 3.5–5.1)
Sodium: 139 mEq/L (ref 135–145)

## 2022-10-17 LAB — CBC WITH DIFFERENTIAL/PLATELET
Basophils Absolute: 0.1 K/uL (ref 0.0–0.1)
Basophils Relative: 1 % (ref 0.0–3.0)
Eosinophils Absolute: 0.5 K/uL (ref 0.0–0.7)
Eosinophils Relative: 6.2 % — ABNORMAL HIGH (ref 0.0–5.0)
HCT: 36.8 % — ABNORMAL LOW (ref 39.0–52.0)
Hemoglobin: 12 g/dL — ABNORMAL LOW (ref 13.0–17.0)
Lymphocytes Relative: 36.8 % (ref 12.0–46.0)
Lymphs Abs: 3.2 K/uL (ref 0.7–4.0)
MCHC: 32.6 g/dL (ref 30.0–36.0)
MCV: 101.3 fl — ABNORMAL HIGH (ref 78.0–100.0)
Monocytes Absolute: 0.8 K/uL (ref 0.1–1.0)
Monocytes Relative: 8.9 % (ref 3.0–12.0)
Neutro Abs: 4.2 K/uL (ref 1.4–7.7)
Neutrophils Relative %: 47.1 % (ref 43.0–77.0)
Platelets: 200 K/uL (ref 150.0–400.0)
RBC: 3.63 Mil/uL — ABNORMAL LOW (ref 4.22–5.81)
RDW: 18.5 % — ABNORMAL HIGH (ref 11.5–15.5)
WBC: 8.8 K/uL (ref 4.0–10.5)

## 2022-10-17 LAB — HEPATIC FUNCTION PANEL
ALT: 18 U/L (ref 0–53)
AST: 22 U/L (ref 0–37)
Albumin: 3.8 g/dL (ref 3.5–5.2)
Alkaline Phosphatase: 76 U/L (ref 39–117)
Bilirubin, Direct: 0.4 mg/dL — ABNORMAL HIGH (ref 0.0–0.3)
Total Bilirubin: 1.8 mg/dL — ABNORMAL HIGH (ref 0.2–1.2)
Total Protein: 6.7 g/dL (ref 6.0–8.3)

## 2022-10-17 NOTE — Patient Instructions (Addendum)
You had the Tdap tetanus shot today  Please be sure to follow up with Dr Berneice Heinrich about the right kidney cyst  Please continue all other medications as before, and refills have been done if requested.  Please have the pharmacy call with any other refills you may need.  Please keep your appointments with your specialists as you may have planned - including the swallowing study, orthopedic for the neck, and neurology, and Dr Rhea Belton GI

## 2022-10-17 NOTE — Progress Notes (Unsigned)
Patient ID: ALEXANDAR STOWELL, male   DOB: 1961-02-17, 62 y.o.   MRN: [id number]        Chief Complaint: follow up recent CT, copd, bronchiectasis,        HPI:  COVEY BLAZ is a 62 y.o. male here for above.  Pt denies chest pain, increased sob or doe, wheezing, orthopnea, PND, increased LE swelling, palpitations, dizziness or syncope.   Pt denies polydipsia, polyuria, or new focal neuro s/s.    Pt denies fever, wt loss, night sweats, loss of appetite, Did seen ENT with improved ear drainage, and astelin rx.  CT has mention of right renal lesion not definitely simple cyst, family and pt concerned. Denies urinary symptoms such as dysuria, frequency, urgency, flank pain, hematuria or n/v, fever, chills.  CT also with bronchiectasis and COPD and pt has occasional scant prod cough.  Denies worsening reflux, abd pain, n/v, bowel change or blood, but does have persistent occasional swallow difficulty, has swallow test for next Monday, then f/u Dr Rhea Belton for this and ? Of proctitis on recent CT.   Due for tdap today Wt Readings from Last 3 Encounters:  10/17/22 211 lb (95.7 kg)  10/05/22 204 lb (92.5 kg)  09/11/22 205 lb (93 kg)   BP Readings from Last 3 Encounters:  10/17/22 130/76  10/05/22 122/74  09/11/22 (!) 160/102         Past Medical History:  Diagnosis Date   Asthma    very rare   Chronic anemia    normocytic   Chronic folliculitis    Environmental and seasonal allergies    H/O pericarditis    2010--  myopercarditis--  resolved   Headache(784.0)    HX SEVERE FRONTAL HA'S   Hiatal hernia    History of gastric ulcer    History of kidney stones    History of MRSA infection 2010   infected boil   HIV (human immunodeficiency virus infection) 1988   Hypertension    Lytic bone lesion of hip    WORK-UP DONE BY ONCOLOGIST DR HA --  NOT MALIGNANT   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   Pneumonia    hx of   Post concussion syndrome    resolved   Prostate cancer 04/25/2013   gleason  3+3=6, volume 30 gm   Ulcer    hx of gastric   Past Surgical History:  Procedure Laterality Date   ACDCF  09/29/2020   ANTERIOR CERVICAL DECOMPRESSION/DISCECTOMY FUSION 4 LEVELS N/A 09/29/2020   Procedure: ANTERIOR CERVICAL DECOMPRESSION FUSION CERVICAL 3-CERVICAL 4 , CERVICAL 4 - CERVICAL 5, CERVICAL 5 - CERVICAL 6 WITH INSTRUMENTATION AND ALLOGRAFT;  Surgeon: Estill Bamberg, MD;  Location: [facility] OR;  Service: Orthopedics;  Laterality: N/A;   CARDIAC CATHETERIZATION  03-23-2009  DR COOPER   NORMAL CORONARY ARTERIES   CHOLECYSTECTOMY N/A 11/06/2014   Procedure: LAPAROSCOPIC CHOLECYSTECTOMY WITH INTRAOPERATIVE CHOLANGIOGRAM;  Surgeon: Jimmye Norman, MD;  Location: [facility] OR;  Service: General;  Laterality: N/A;   COLONOSCOPY  12/26/2011   Procedure: COLONOSCOPY;  Surgeon: Shirley Friar, MD;  Location: [facility] ENDOSCOPY;  Service: Endoscopy;  Laterality: N/A;   COLONOSCOPY     COLONOSCOPY WITH PROPOFOL N/A 07/29/2014   Procedure: COLONOSCOPY WITH PROPOFOL;  Surgeon: Shirley Friar, MD;  Location: [facility] ENDOSCOPY;  Service: Endoscopy;  Laterality: N/A;   CYSTOSCOPY N/A 05/18/2018   Procedure: CYSTOSCOPY REMOVAL FOREIGN BODY;  Surgeon: Ihor Gully, MD;  Location: [facility] ORS;  Service: Urology;  Laterality: N/A;  CYSTOSCOPY/RETROGRADE/URETEROSCOPY Bilateral 04/25/2013   Procedure: CYSTOSCOPY/ BILATERAL RETROGRADES; BLADDER BIOPSIES;  Surgeon: Sebastian Ache, MD;  Location: [facility];  Service: Urology;  Laterality: Bilateral;   DENTAL EXAMINATION UNDER ANESTHESIA     ESOPHAGOGASTRODUODENOSCOPY  12/26/2011   Procedure: ESOPHAGOGASTRODUODENOSCOPY (EGD);  Surgeon: Shirley Friar, MD;  Location: [facility];  Service: Endoscopy;  Laterality: N/A;   ESOPHAGOGASTRODUODENOSCOPY (EGD) WITH PROPOFOL N/A 07/29/2014   Procedure: ESOPHAGOGASTRODUODENOSCOPY (EGD) WITH PROPOFOL;  Surgeon: Shirley Friar, MD;  Location: [facility] ENDOSCOPY;  Service: Endoscopy;  Laterality: N/A;   EXCISION CHRONIC LEFT  BREAST ABSCESS  09-21-2010   EXCISIONAL BX LEFT BREAST MASS/  I  &  D LEFT BREAST ABSCESS  03-24-2009   KNEE ARTHROSCOPY Right 1985   left axilla biopsy  04/2013   left hip biopsy  10/2012   LYMPHADENECTOMY Bilateral 08/18/2013   Procedure: LYMPHADENECTOMY "PELVIC LYMPH NODE DISSECTION";  Surgeon: Sebastian Ache, MD;  Location: [facility] ORS;  Service: Urology;  Laterality: Bilateral;   PROSTATE BIOPSY N/A 04/25/2013   Procedure: BIOPSY TRANSRECTAL ULTRASONIC PROSTATE (TUBP);  Surgeon: Sebastian Ache, MD;  [facility];  Service: Urology;  Laterality: N/A;   ROBOT ASSISTED LAPAROSCOPIC RADICAL PROSTATECTOMY N/A 08/18/2013   Procedure: ROBOTIC ASSISTED LAPAROSCOPIC RADICAL PROSTATECTOMY;  Surgeon: Sebastian Ache, MD;  Location: [facility] ORS;  Service: Urology;  Laterality: N/A;   TOTAL KNEE ARTHROPLASTY Right 08/01/2016   08/17/16   TOTAL KNEE ARTHROPLASTY Right 08/01/2016   Procedure: TOTAL KNEE ARTHROPLASTY;  Surgeon: Sheral Apley, MD;  Location: [facility] OR;  Service: Orthopedics;  Laterality: Right;   UPPER GASTROINTESTINAL ENDOSCOPY     UPPER LEG SOFT TISSUE BIOPSY Left 2012   lthigh    reports that he quit smoking about 12 years ago. His smoking use included cigars and cigarettes. He has a 34.50 pack-year smoking history. He has never used smokeless tobacco. He reports that he does not currently use alcohol. He reports that he does not use drugs. family history includes Asthma in his father and sister; Cancer in his father and maternal uncle; Diabetes in his father; Hypertension in his sister; Stroke in his father. Allergies  Allergen Reactions   Amoxapine Shortness Of Breath   Chocolate Shortness Of Breath and Other (See Comments)    Asthma attack, welts   Ciprofloxacin Anaphylaxis, Dermatitis, Itching, Nausea And Vomiting, Shortness Of Breath, Swelling and Hives   Descovy [Emtricitabine-Tenofovir Af] Shortness Of Breath    Nasuea, vomiiting, diarrhea, sob, whelps, throat closes    Doxycycline Hyclate Shortness Of Breath and Swelling   Genvoya [Elviteg-Cobic-Emtricit-Tenofaf] Shortness Of Breath    Nausea, vomitting, diarreha, sob, rash, throat closing   Morphine And Related Shortness Of Breath, Itching and Other (See Comments)     chest pain   Other Anaphylaxis   Oxycodone-Acetaminophen Shortness Of Breath   Penicillins Shortness Of Breath and Rash    Has patient had a PCN reaction causing immediate rash, facial/tongue/throat swelling, SOB or lightheadedness with hypotension: Yes Has patient had a PCN reaction causing severe rash involving mucus membranes or skin necrosis: No Has patient had a PCN reaction that required hospitalization No Has patient had a PCN reaction occurring within the last 10 years: Yes If all of the above answers are "NO", then may proceed with Cephalosporin use.   Sulfa Antibiotics Anaphylaxis and Other (See Comments)   Tomato Shortness Of Breath   Vancomycin Shortness Of Breath and Other (See Comments)   Darunavir Itching   Amlodipine Other (See Comments)  swelling   Carvedilol     LEG SWELLING, DIZZNESS   Flagyl [Metronidazole] Itching   Milk-Related Compounds    Morphine Other (See Comments)   Oxycodone Hcl Other (See Comments)   Penicillin G Other (See Comments)   Toradol [Ketorolac Tromethamine] Nausea And Vomiting    itching   Acetaminophen Rash and Other (See Comments)    Pt tolerates percocet and also plain APAP without rash   Tramadol Itching, Nausea And Vomiting and Other (See Comments)   Current Outpatient Medications on File Prior to Visit  Medication Sig Dispense Refill   albuterol (PROVENTIL) (2.5 MG/3ML) 0.083% nebulizer solution Take 3 mLs (2.5 mg total) by nebulization every 6 (six) hours as needed for wheezing or shortness of breath. 75 mL 3   ALPRAZolam (XANAX) 0.5 MG tablet TAKE 1 TABLET(0.5 MG) BY MOUTH TWICE DAILY AS NEEDED FOR SLEEP 60 tablet 2   Beclomethasone Dipropionate (QNASL) 80 MCG/ACT AERS Place 2  sprays into both nostrils daily. 10.6 g 2   BIKTARVY 50-200-25 MG TABS tablet TAKE 1 TABLET BY MOUTH DAILY 30 tablet 11   BREZTRI AEROSPHERE 160-9-4.8 MCG/ACT AERO INHALE 2 PUFFS INTO THE LUNGS IN THE MORNING AND AT BEDTIME 10.7 g 0   cetirizine (ZYRTEC) 10 MG tablet Take 1 tablet (10 mg total) by mouth daily. 90 tablet 3   chlorpheniramine-HYDROcodone (TUSSIONEX) 10-8 MG/5ML Take 5 mLs by mouth every 12 (twelve) hours as needed for cough. 115 mL 0   Cholecalciferol (VITAMIN D3) 250 MCG (10000 UT) capsule Take 1 tablet by mouth daily. [employer]     diphenhydrAMINE (BENADRYL) 25 MG tablet Take 1 tablet (25 mg total) by mouth every 6 (six) hours as needed. (Patient taking differently: Take 25 mg by mouth every 6 (six) hours as needed for sleep, itching or allergies.) 30 tablet 0   dorzolamide (TRUSOPT) 2 % ophthalmic solution 1 drop 2 (two) times daily.     EPINEPHrine 0.3 mg/0.3 mL IJ SOAJ injection Inject 0.3 mg into the muscle as needed for anaphylaxis. 1 each 5   famotidine (PEPCID) 40 MG tablet Take 1 tablet (40 mg total) by mouth daily. 30 tablet 5   gabapentin (NEURONTIN) 300 MG capsule TAKE 1 CAPSULE(300 MG) BY MOUTH FOUR TIMES DAILY AS NEEDED FOR PAIN 120 capsule 0   Glucos-Chond-Hyal Ac-Ca Fructo (MOVE FREE JOINT HEALTH ADVANCE PO) Take 1 tablet by mouth daily.     hydrALAZINE (APRESOLINE) 50 MG tablet TAKE 1 TABLET(50 MG) BY MOUTH THREE TIMES DAILY 270 tablet 1   HYDROcodone-acetaminophen (NORCO/VICODIN) 5-325 MG tablet Take 1 tablet by mouth every 6 (six) hours as needed.     hydrOXYzine (ATARAX) 50 MG tablet Take 1 tablet (50 mg total) by mouth 2 (two) times daily. 60 tablet 1   meclizine (ANTIVERT) 25 MG tablet Take 1 tablet (25 mg total) by mouth daily as needed. 40 tablet 5   methocarbamol (ROBAXIN) 500 MG tablet Take 500 mg by mouth 4 (four) times daily.     metoprolol succinate (TOPROL-XL) 100 MG 24 hr tablet TAKE 1 TABLET(100 MG) BY MOUTH DAILY WITH OR IMMEDIATELY FOLLOWING A  MEAL 90 tablet 3   montelukast (SINGULAIR) 10 MG tablet Take 10 mg by mouth daily.     Multiple Vitamin (MULTIVITAMIN WITH MINERALS) TABS tablet Take 1 tablet by mouth daily after breakfast.      mupirocin ointment (BACTROBAN) 2 % APPLY EXTERNALLY TO THE AFFECTED AREA TWICE DAILY FOR 3 WEEKS, PLACE COTTON BETWEEN TOES TO ALLOW BETTER  AERATION AS NEEDED (Patient taking differently: Apply 1 application  topically See admin instructions. APPLY EXTERNALLY TO THE AFFECTED AREA TWICE DAILY FOR 3 WEEKS, PLACE COTTON BETWEEN TOES TO ALLOW BETTER AERATION AS NEEDED) 66 g 1   neomycin-polymyxin-hydrocortisone (CORTISPORIN) OTIC solution Place 4 drops into the left ear 4 (four) times daily. 10 mL 0   ondansetron (ZOFRAN-ODT) 4 MG disintegrating tablet Take 1 tablet (4 mg total) by mouth every 8 (eight) hours as needed for nausea or vomiting. 30 tablet 2   OVER THE COUNTER MEDICATION Take 2 capsules by mouth daily. Zhou: thyroid supplement     pantoprazole (PROTONIX) 40 MG tablet Take 1 tablet (40 mg total) by mouth daily. 90 tablet 3   predniSONE (DELTASONE) 10 MG tablet 3 tabs by mouth per day for 3 days,2tabs per day for 3 days,1tab per day for 3 days 18 tablet 0   rosuvastatin (CRESTOR) 20 MG tablet TAKE 1 TABLET(20 MG) BY MOUTH DAILY 90 tablet 3   tizanidine (ZANAFLEX) 6 MG capsule TAKE 1 CAPSULE(6 MG) BY MOUTH THREE TIMES DAILY 90 capsule 11   torsemide (DEMADEX) 20 MG tablet Take 10 mg by mouth See admin instructions. Tuesday Thursday Saturday     Ubrogepant (UBRELVY) 100 MG TABS Take 100 mg by mouth 2 (two) times daily as needed (can take a seecond dose 2 hiurs after first do not exceed 2 a day.). 10 tablet 5   VENTOLIN HFA 108 (90 Base) MCG/ACT inhaler INHALE 2 PUFFS INTO THE LUNGS EVERY 6 HOURS AS NEEDED FOR WHEEZING OR SHORTNESS OF BREATH 36 g 3   nitroGLYCERIN (NITROSTAT) 0.4 MG SL tablet Place 1 tablet (0.4 mg total) under the tongue every 5 (five) minutes as needed for chest pain. 30 tablet 1    [DISCONTINUED] zolpidem (AMBIEN) 5 MG tablet Take 1-2 tablets (5-10 mg total) by mouth at bedtime as needed for sleep. (Patient not taking: Reported on 04/23/2019) 30 tablet 2   No current facility-administered medications on file prior to visit.        ROS:  All others reviewed and negative.  Objective        PE:  BP 130/76 (BP Location: Right Arm, Patient Position: Sitting, Cuff Size: Normal)   Pulse 70   Temp 98.4 F (36.9 C) (Oral)   Ht 5\' 10"  (1.778 m)   Wt 211 lb (95.7 kg)   SpO2 95%   BMI 30.28 kg/m                 Constitutional: Pt appears in NAD               HENT: Head: NCAT.                Right Ear: External ear normal.                 Left Ear: External ear normal.                Eyes: . Pupils are equal, round, and reactive to light. Conjunctivae and EOM are normal               Nose: without d/c or deformity               Neck: Neck supple. Gross normal ROM               Cardiovascular: Normal rate and regular rhythm.  Pulmonary/Chest: Effort normal and breath sounds decreased without rales or wheezing.                Abd:  Soft, NT, ND, + BS, no organomegaly               Neurological: Pt is alert. At baseline orientation, motor grossly intact               Skin: Skin is warm. No rashes, no other new lesions, LE edema - none               Psychiatric: Pt behavior is normal without agitation   Micro: none  Cardiac tracings I have personally interpreted today:  none  Pertinent Radiological findings (summarize): none   Lab Results  Component Value Date   WBC 8.8 10/17/2022   HGB 12.0 (L) 10/17/2022   HCT 36.8 (L) 10/17/2022   PLT 200.0 10/17/2022   GLUCOSE 91 10/17/2022   CHOL 139 08/14/2022   TRIG 101.0 08/14/2022   HDL 39.10 08/14/2022   LDLCALC 80 08/14/2022   ALT 18 10/17/2022   AST 22 10/17/2022   NA 139 10/17/2022   K 4.2 10/17/2022   CL 104 10/17/2022   CREATININE 1.20 10/17/2022   BUN 20 10/17/2022   CO2 30 10/17/2022   TSH  2.51 08/14/2022   PSA 0.00 (L) 08/14/2022   INR 1.0 06/23/2021   HGBA1C <4.2 08/15/2022   MICROALBUR 16.88 (H) 10/05/2011   Assessment/Plan:  SAMULE LIFE is a 62 y.o. 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  COPD GOLD III Stable overall, cont inhaler prn   Allergic rhinitis With some improvement cont same tx with astelin tx.  Essential hypertension BP Readings from Last 3 Encounters:  10/17/22 130/76  10/05/22 122/74  09/11/22 (!) 160/102   Stable, pt to continue medical treatment hydralazein 50 tid, toprol xl 100 qd   Abnormal positron emission tomography (PET) scan Last exam 2014, family asking for repeat but recent CT without evidence for specific need for this, will hold for now  Bronchiectasis Noted on recent CT, asympt, ok to follow for now,  to f/u any worsening symptoms or concerns  Right renal mass Noted on recent CT, pt and sister to f/u with Dr Berneice Heinrich soon as planned to consider need for further eval and tx  Followup: Return if symptoms worsen or fail to improve.  Oliver Barre, MD 10/19/2022 4:36 AM [facility]

## 2022-10-18 ENCOUNTER — Ambulatory Visit (HOSPITAL_COMMUNITY)
Admission: RE | Admit: 2022-10-18 | Discharge: 2022-10-18 | Disposition: A | Discharge status: Home / Self Care | Payer: 59 | Source: Ambulatory Visit | Attending: Internal Medicine | Admitting: Internal Medicine

## 2022-10-18 DIAGNOSIS — R1310 Dysphagia, unspecified: Secondary | ICD-10-CM | POA: Diagnosis not present

## 2022-10-18 NOTE — Progress Notes (Signed)
The test results show that your current treatment is OK, as the tests are stable.  Please continue the same plan.  There is no other need for change of treatment or further evaluation based on these results, at this time.  thanks 

## 2022-10-19 ENCOUNTER — Other Ambulatory Visit: Payer: Self-pay | Admitting: Pulmonary Disease

## 2022-10-19 ENCOUNTER — Encounter: Payer: Self-pay | Admitting: Internal Medicine

## 2022-10-19 DIAGNOSIS — N2889 Other specified disorders of kidney and ureter: Secondary | ICD-10-CM | POA: Insufficient documentation

## 2022-10-19 DIAGNOSIS — J479 Bronchiectasis, uncomplicated: Secondary | ICD-10-CM | POA: Insufficient documentation

## 2022-10-19 NOTE — Assessment & Plan Note (Signed)
Stable overall, cont inhaler prn 

## 2022-10-19 NOTE — Assessment & Plan Note (Signed)
Last exam 2014, family asking for repeat but recent CT without evidence for specific need for this, will hold for now

## 2022-10-19 NOTE — Assessment & Plan Note (Signed)
BP Readings from Last 3 Encounters:  10/17/22 130/76  10/05/22 122/74  09/11/22 (!) 160/102   Stable, pt to continue medical treatment hydralazein 50 tid, toprol xl 100 qd

## 2022-10-19 NOTE — Assessment & Plan Note (Signed)
Noted on recent CT, pt and sister to f/u with Dr Berneice Heinrich soon as planned to consider need for further eval and tx

## 2022-10-19 NOTE — Assessment & Plan Note (Signed)
With some improvement cont same tx with astelin tx.

## 2022-10-19 NOTE — Assessment & Plan Note (Signed)
Noted on recent CT, asympt, ok to follow for now,  to f/u any worsening symptoms or concerns

## 2022-10-20 ENCOUNTER — Other Ambulatory Visit: Payer: 59

## 2022-10-20 ENCOUNTER — Ambulatory Visit (HOSPITAL_COMMUNITY): Payer: 59

## 2022-10-24 DIAGNOSIS — M25612 Stiffness of left shoulder, not elsewhere classified: Secondary | ICD-10-CM | POA: Diagnosis not present

## 2022-10-24 DIAGNOSIS — M7542 Impingement syndrome of left shoulder: Secondary | ICD-10-CM | POA: Diagnosis not present

## 2022-10-25 ENCOUNTER — Encounter: Payer: PRIVATE HEALTH INSURANCE | Admitting: Infectious Diseases

## 2022-10-25 DIAGNOSIS — H401114 Primary open-angle glaucoma, right eye, indeterminate stage: Secondary | ICD-10-CM | POA: Diagnosis not present

## 2022-10-25 DIAGNOSIS — H40022 Open angle with borderline findings, high risk, left eye: Secondary | ICD-10-CM | POA: Diagnosis not present

## 2022-10-26 ENCOUNTER — Encounter: Payer: Self-pay | Admitting: Internal Medicine

## 2022-10-26 ENCOUNTER — Encounter: Payer: Self-pay | Admitting: Neurology

## 2022-10-26 ENCOUNTER — Ambulatory Visit (INDEPENDENT_AMBULATORY_CARE_PROVIDER_SITE_OTHER): Payer: 59 | Admitting: Neurology

## 2022-10-26 ENCOUNTER — Other Ambulatory Visit: Payer: Self-pay | Admitting: Neurology

## 2022-10-26 ENCOUNTER — Other Ambulatory Visit: Payer: PRIVATE HEALTH INSURANCE

## 2022-10-26 VITALS — BP 139/92 | HR 62 | Ht 69.0 in | Wt 205.0 lb

## 2022-10-26 DIAGNOSIS — M4722 Other spondylosis with radiculopathy, cervical region: Secondary | ICD-10-CM

## 2022-10-26 DIAGNOSIS — G4700 Insomnia, unspecified: Secondary | ICD-10-CM

## 2022-10-26 DIAGNOSIS — A879 Viral meningitis, unspecified: Secondary | ICD-10-CM | POA: Diagnosis not present

## 2022-10-26 DIAGNOSIS — R1312 Dysphagia, oropharyngeal phase: Secondary | ICD-10-CM | POA: Diagnosis not present

## 2022-10-26 DIAGNOSIS — B20 Human immunodeficiency virus [HIV] disease: Secondary | ICD-10-CM

## 2022-10-26 MED ORDER — MELOXICAM 7.5 MG PO TABS: 7.5000 mg | ORAL_TABLET | Freq: Every day | ORAL | 1 refills | Status: DC

## 2022-10-26 NOTE — Progress Notes (Addendum)
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 times daily. 10 mL 0   ondansetron (ZOFRAN-ODT) 4 MG disintegrating tablet Take 1 tablet (4 mg total) by mouth every 8 (eight) hours as needed for nausea or vomiting. 30 tablet 2   OVER THE COUNTER MEDICATION Take 2 capsules by mouth daily. Zhou: thyroid supplement     pantoprazole (PROTONIX) 40 MG tablet Take 1 tablet (40 mg total) by mouth daily. 90 tablet 3   rosuvastatin (CRESTOR) 20 MG tablet TAKE 1 TABLET(20 MG) BY MOUTH DAILY 90 tablet 3   tizanidine (ZANAFLEX) 6 MG capsule TAKE 1 CAPSULE(6 MG) BY MOUTH THREE TIMES DAILY 90 capsule 11   torsemide (DEMADEX) 20 MG tablet Take 10 mg by mouth See admin instructions. Tuesday Thursday Saturday     Ubrogepant (UBRELVY) 100 MG TABS Take 100 mg by mouth 2 (two) times daily as needed (can take a seecond dose 2 hiurs after first do not exceed 2 a day.). 10 tablet 5   VENTOLIN HFA 108 (90 Base) MCG/ACT inhaler INHALE 2 PUFFS INTO THE LUNGS EVERY 6 HOURS AS NEEDED FOR WHEEZING OR SHORTNESS OF BREATH 36 g 3   nitroGLYCERIN (NITROSTAT) 0.4 MG SL tablet Place 1 tablet (0.4 mg total) under the tongue every 5 (five) minutes as needed for chest pain. 30 tablet 1   [DISCONTINUED] zolpidem (AMBIEN) 5 MG  tablet Take 1-2 tablets (5-10 mg total) by mouth at bedtime as needed for sleep. (Patient not taking: Reported on 04/23/2019) 30 tablet 2   No current facility-administered medications on file prior to visit.    Allergies  Allergen Reactions   Amoxapine Shortness Of Breath   Chocolate Shortness Of Breath and Other (See Comments)    Asthma attack, welts   Ciprofloxacin Anaphylaxis, Dermatitis, Itching, Nausea And Vomiting, Shortness Of Breath, Swelling and Hives   Descovy [Emtricitabine-Tenofovir Af] Shortness Of Breath    Nasuea, vomiiting, diarrhea, sob, whelps, throat closes  Doxycycline Hyclate Shortness Of Breath and Swelling   Genvoya [Elviteg-Cobic-Emtricit-Tenofaf] Shortness Of Breath    Nausea, vomitting, diarreha, sob, rash, throat closing   Morphine And Related Shortness Of Breath, Itching and Other (See Comments)     chest pain   Other Anaphylaxis   Oxycodone-Acetaminophen Shortness Of Breath   Penicillins Shortness Of Breath and Rash    Has patient had a PCN reaction causing immediate rash, facial/tongue/throat swelling, SOB or lightheadedness with hypotension: Yes Has patient had a PCN reaction causing severe rash involving mucus membranes or skin necrosis: No Has patient had a PCN reaction that required hospitalization No Has patient had a PCN reaction occurring within the last 10 years: Yes If all of the above answers are "NO", then may proceed with Cephalosporin use.   Sulfa Antibiotics Anaphylaxis and Other (See Comments)   Tomato Shortness Of Breath   Vancomycin Shortness Of Breath and Other (See Comments)   Darunavir Itching   Amlodipine Other (See Comments)    swelling   Carvedilol     LEG SWELLING, DIZZNESS   Flagyl [Metronidazole] Itching   Milk-Related Compounds    Morphine Other (See Comments)   Oxycodone Hcl Other (See Comments)   Penicillin G Other (See Comments)   Toradol [Ketorolac Tromethamine] Nausea And Vomiting    itching   Acetaminophen Rash  and Other (See Comments)    Pt tolerates percocet and also plain APAP without rash   Tramadol Itching, Nausea And Vomiting and Other (See Comments)     DIAGNOSTIC DATA (LABS, IMAGING, TESTING) - I reviewed patient records, labs, notes, testing and imaging myself where available.  Lab Results  Component Value Date   WBC 8.8 10/17/2022   HGB 12.0 (L) 10/17/2022   HCT 36.8 (L) 10/17/2022   MCV 101.3 (H) 10/17/2022   PLT 200.0 10/17/2022      Component Value Date/Time   NA 139 10/17/2022 1555   NA 143 01/25/2022 1611   NA 142 02/24/2013 0848   K 4.2 10/17/2022 1555   K 3.4 (L) 02/24/2013 0848   CL 104 10/17/2022 1555   CO2 30 10/17/2022 1555   CO2 24 02/24/2013 0848   GLUCOSE 91 10/17/2022 1555   GLUCOSE 121 02/24/2013 0848   GLUCOSE 93 03/25/2009 0000   BUN 20 10/17/2022 1555   BUN 8 01/25/2022 1611   BUN 11.9 02/24/2013 0848   CREATININE 1.20 10/17/2022 1555   CREATININE 1.24 11/11/2020 1526   CREATININE 1.3 02/24/2013 0848   CALCIUM 8.7 10/17/2022 1555   CALCIUM 8.9 02/24/2013 0848   PROT 6.7 10/17/2022 1555   PROT 7.0 01/25/2022 1611   PROT 7.1 02/24/2013 0848   ALBUMIN 3.8 10/17/2022 1555   ALBUMIN 4.3 01/25/2022 1611   ALBUMIN 3.6 02/24/2013 0848   AST 22 10/17/2022 1555   AST 14 02/24/2013 0848   ALT 18 10/17/2022 1555   ALT 11 02/24/2013 0848   ALKPHOS 76 10/17/2022 1555   ALKPHOS 94 02/24/2013 0848   BILITOT 1.8 (H) 10/17/2022 1555   BILITOT 0.5 01/25/2022 1611   BILITOT 1.40 (H) 02/24/2013 0848   GFRNONAA >60 02/25/2022 2051   GFRNONAA 70 12/04/2018 1543   GFRAA >60 05/08/2019 0504   GFRAA 82 12/04/2018 1543   Lab Results  Component Value Date   CHOL 139 08/14/2022   HDL 39.10 08/14/2022   LDLCALC 80 08/14/2022   TRIG 101.0 08/14/2022   CHOLHDL 4 08/14/2022   Lab Results  Component Value Date   HGBA1C <4.2 08/15/2022  Lab Results  Component Value Date   VITAMINB12 458 08/14/2022   Lab Results  Component Value Date   TSH 2.51 08/14/2022     PHYSICAL EXAM:  Today's Vitals   10/26/22 1533  BP: (!) 139/92  Pulse: 62  Weight: 205 lb (93 kg)  Height: 5\' 9"  (1.753 m)   Body mass index is 30.27 kg/m.   Wt Readings from Last 3 Encounters:  10/26/22 205 lb (93 kg)  10/17/22 211 lb (95.7 kg)  10/05/22 204 lb (92.5 kg)     Ht Readings from Last 3 Encounters:  10/26/22 5\' 9"  (1.753 m)  10/17/22 5\' 10"  (1.778 m)  10/05/22 5\' 10"  (1.778 m)      General: The patient is awake, alert and appears not in acute distress. The patient is well groomed. Head: Normocephalic, atraumatic. Neck is  straightened- loss of lordosis, not supple.Cardiovascular:  Regular rate and cardiac rhythm by pulse,  without distended neck veins. Respiratory: Lungs are clear to auscultation.  Skin:  Without evidence of ankle edema, or rash. Trunk: The patient's posture is erect.   NEUROLOGIC EXAM: The patient is awake and alert, oriented to place and time.   Memory subjective described as intact.  Attention span & concentration ability appears normal.  Speech is fluent,  without  dysarthria, dysphonia or aphasia.  Mood and affect are appropriate.   Cranial nerves: no loss of smell or taste reported  Pupils are equal and briskly reactive to light. Funduscopic exam deferred. .  Extraocular movements in vertical and horizontal planes were intact and without nystagmus. No Diplopia. Visual fields by finger perimetry are intact. Hearing was intact to soft voice and finger rubbing.    Facial sensation intact to fine touch.  Facial motor strength is symmetric and tongue and uvula move midline.  Neck ROM : rotation, tilt and flexion extension were normal for age and shoulder shrug was symmetrical.       ASSESSMENT AND PLAN 62 y.o. year old male  here with:    1) I am not sure who will deal with this high level of dysphagia- ENT ?  there is no motility abnormality, no compression, status post 2022 ACDF- if a result of that OP, why would the symptoms  come now 18 month later ?   2) headaches seem chronic and not vascular. Not migrainus, has reported Vertigo as associated symptom, and he has cervicalgia.   Meloxicam  po for cervicalgia and neck pain. If no success, I will try PT for him and trigger point injections.   ENT referral for dysphagia. This is way out of my scope in Sleep Neurology.  I plan to follow up either personally or through our NP within 8 months.   I would like to thank Casey Levins, MD , Dr Ninetta Lights and Dr Casey Brennan for allowing me to meet with and to take care of this pleasant patient.   CC: I will share my notes with Dr . Rose Phi III.  After spending a total time of  35  minutes face to face and additional time for physical and neurologic examination, review of laboratory studies,  personal review of imaging studies, reports and results of other testing and review of referral information / records as far as provided in visit,   Electronically signed by: Casey Novas, MD 10/26/2022 4:05 PM  [facility] Board certified by The [employer] of Sleep Medicine and Diplomate of the [employer] of Sleep  Medicine. Board certified In Neurology through the ABPN, Fellow of the [employer] of Neurology. Medical Director of [facility].

## 2022-10-30 ENCOUNTER — Other Ambulatory Visit: Payer: Self-pay

## 2022-10-30 ENCOUNTER — Telehealth: Payer: Self-pay | Admitting: Internal Medicine

## 2022-10-30 ENCOUNTER — Other Ambulatory Visit: Payer: Self-pay | Admitting: Neurology

## 2022-10-30 DIAGNOSIS — M25512 Pain in left shoulder: Secondary | ICD-10-CM | POA: Diagnosis not present

## 2022-10-30 DIAGNOSIS — J449 Chronic obstructive pulmonary disease, unspecified: Secondary | ICD-10-CM

## 2022-10-30 DIAGNOSIS — G4700 Insomnia, unspecified: Secondary | ICD-10-CM

## 2022-10-30 MED ORDER — ALBUTEROL SULFATE HFA 108 (90 BASE) MCG/ACT IN AERS
2.0000 | INHALATION_SPRAY | Freq: Four times a day (QID) | RESPIRATORY_TRACT | 3 refills | Status: DC | PRN
Start: 2022-10-30 — End: 2022-12-20

## 2022-10-30 NOTE — Telephone Encounter (Signed)
Refill has been sent.  °

## 2022-10-30 NOTE — Telephone Encounter (Signed)
Prescription Request  10/30/2022  LOV: 10/17/2022  What is the name of the medication or equipment? Ventolin inhaler - 90 day supply  Have you contacted your pharmacy to request a refill? Yes   Which pharmacy would you like this sent to?  [id number] - [place], [place] - [address] AT [facility] OF [address] [place] [address] Phone: [phone] Fax: [phone]    Patient notified that their request is being sent to the clinical staff for review and that they should receive a response within 2 business days.   Please advise at Mobile [phone] (mobile)

## 2022-10-30 NOTE — Telephone Encounter (Signed)
Pt sisiter called. Stated Dr. Sunday Shams prescript meloxicam (MOBIC) 7.5 MG tablet. Stated pt is allergic to tylenol, Advil and Motion and the pharmacy want her to know before filling the Meloxicam. She also stated pt hands and feet or really cold. She is requesting a call back from nurse.

## 2022-10-31 ENCOUNTER — Encounter: Payer: Self-pay | Admitting: Infectious Diseases

## 2022-10-31 ENCOUNTER — Ambulatory Visit (INDEPENDENT_AMBULATORY_CARE_PROVIDER_SITE_OTHER): Payer: 59 | Admitting: Infectious Diseases

## 2022-10-31 VITALS — BP 126/83 | HR 58 | Temp 97.7°F | Ht 69.0 in | Wt 207.8 lb

## 2022-10-31 DIAGNOSIS — M4722 Other spondylosis with radiculopathy, cervical region: Secondary | ICD-10-CM | POA: Diagnosis not present

## 2022-10-31 DIAGNOSIS — M5412 Radiculopathy, cervical region: Secondary | ICD-10-CM | POA: Diagnosis not present

## 2022-10-31 DIAGNOSIS — Z21 Asymptomatic human immunodeficiency virus [HIV] infection status: Secondary | ICD-10-CM

## 2022-10-31 DIAGNOSIS — J4530 Mild persistent asthma, uncomplicated: Secondary | ICD-10-CM

## 2022-10-31 DIAGNOSIS — Z87891 Personal history of nicotine dependence: Secondary | ICD-10-CM

## 2022-10-31 DIAGNOSIS — G43109 Migraine with aura, not intractable, without status migrainosus: Secondary | ICD-10-CM | POA: Diagnosis not present

## 2022-10-31 DIAGNOSIS — G959 Disease of spinal cord, unspecified: Secondary | ICD-10-CM

## 2022-10-31 NOTE — Assessment & Plan Note (Addendum)
Potential EMG due to ulnar wasting.  MRI of shoulder pending.  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

## 2022-10-31 NOTE — Assessment & Plan Note (Signed)
Will recheck his CD4 and HIV RNA Hopefully can stay on biktarvy He is open to starting cabaneuva, juluca would also be a consideration.  Will call him with results.

## 2022-10-31 NOTE — Assessment & Plan Note (Signed)
Appreciate Dr Juluis Mire f/u.

## 2022-10-31 NOTE — Telephone Encounter (Signed)
Requested Prescriptions   Pending Prescriptions Disp Refills   ALPRAZolam (XANAX) 0.5 MG tablet [Pharmacy Med Name: ALPRAZOLAM 0.5MG  TABLETS] 60 tablet     Sig: TAKE 1 TABLET(0.5 MG) BY MOUTH TWICE DAILY AS NEEDED FOR SLEEP   Last seen by Dr. Vickey Huger on 10/26/22, next appt not yet scheduled supposed to see [place] back in 8months. ROUTING TO PROVIDER TO FILL.  ALPRAZolam Adherence  Dispenses   Dispensed Days Supply Quantity Provider Pharmacy  ALPRAZOLAM 0.5MG  TABLETS 09/22/2022 30 60 each Sater, Pearletha Furl, MD [facility] #...  ALPRAZOLAM 0.5MG  TABLETS 08/02/2022 30 60 each Sater, Pearletha Furl, MD [facility] DR[facility] STORE #...  ALPRAZOLAM 0.5MG  TABLETS 06/07/2022 30 60 each Sater, Pearletha Furl, MD [facility] STORE #...  ALPRAZOLAM 0.5MG  TABLETS 03/28/2022 30 60 each Dohmeier, Porfirio Mylar, MD [facility] DRUG STORE #...  ALPRAZOLAM 0.5MG  TABLETS 02/17/2022 30 60 each Dohmeier, Porfirio Mylar, MD [facility] DRUG STORE #...  ALPRAZOLAM 0.5MG  TABLETS 12/27/2021 30 60 each Dohmeier, Porfirio Mylar, MD [facility] DR[facility] STORE #...  ALPRAZOLAM 0.5MG  TABLETS 11/11/2021 30 60 each Dohmeier, Porfirio Mylar, MD [facility] DRUG STORE #.Marland KitchenMarland Kitchen

## 2022-10-31 NOTE — Assessment & Plan Note (Signed)
Appreciate Dr Nicholos Johns f/u.  Hopefully will get him suppressed prior to surgery.

## 2022-10-31 NOTE — Progress Notes (Signed)
Subjective:    Patient ID: Casey Brennan, male  DOB: Mar 02, 1961, 62 y.o.        MRN: [id number]   HPI 62 yo M with hx HIV+ (2012), COPD Gold,  and prostate Ca.  Had radical prostatectomy 08-18-13, adenoCA, LN (-), margins (-).  Cont to have uro f/u (next 04-2022).  Had R TKR 08-2016   Was changed to genvoya, prezista (05-2016). He developed a rash from the sulfa in the darunavir, changed back to his prev rx.  Prev ? Allergy to descovey (more likely darunavir).He was previously changed to biktarvy from ISN/TRV .    09-29-20: ANTERIOR CERVICAL DECOMPRESSION FUSION CERVICAL 3-CERVICAL 4 , CERVICAL 4 - CERVICAL 5, CERVICAL 5 - CERVICAL 6 WITH INSTRUMENTATION AND ALLOGRAFT Planning to have more surgery sometime in the next 2 weeks. Has not had worsening pain, has had falls however.    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 He has been traveling to [place] and all over lately.  He had meningitis 06-2021. HSV, VZV PCR (-), crypto (-).    Has seen by ophtho- concern for cataracts, glaucoma. Going to [employer].   He had some concerning findings on his previous abd imaging- most recent shows renal cyst/hemorrhagic. R adrenal nodule.   Sister notes his hands and feet have been cold. Has had interosseus wasting in R hand. PCP considering EMG.   Occas missed doses of biktarvy. Tries not to take on empty stomach due to nausea.   HIV 1 RNA Quant  Date Value  06/29/2021 597 Copies/mL (H)  11/11/2020 NOT DETECTED copies/mL  03/11/2020 <20 Copies/mL   HIV-1 RNA Viral Load (copies/mL)  Date Value  01/25/2022 28,900   CD4 T Cell Abs (/uL)  Date Value  01/25/2022 332 (L)  11/11/2020 659  03/11/2020 479     Health Maintenance  Topic Date Due  . DTaP/Tdap/Td (2 - Td or Tdap) 06/11/2021  . COVID-19 Vaccine (6 - 2023-24 season) 11/02/2022 (Originally 03/03/2022)  . INFLUENZA VACCINE  02/01/2023  . Medicare Annual Wellness (AWV)  04/08/2023  . Lung Cancer Screening  10/09/2023  .  COLONOSCOPY (Pts 45-84yrs Insurance coverage will need to be confirmed)  12/25/2028  . Hepatitis C Screening  Completed  . HIV Screening  Completed  . Zoster Vaccines- Shingrix  Completed  . HPV VACCINES  Aged Out      Review of Systems  Constitutional:  Negative for chills, fever and weight loss.  Respiratory:  Positive for sputum production, shortness of breath and wheezing.   Gastrointestinal:  Negative for constipation and diarrhea.  Genitourinary:  Negative for dysuria.    Please see HPI. All other systems reviewed and negative.     Objective:  Physical Exam Vitals reviewed.  Constitutional:      Appearance: Normal appearance.  HENT:     Mouth/Throat:     Mouth: Mucous membranes are moist.     Pharynx: No oropharyngeal exudate.  Eyes:     Extraocular Movements: Extraocular movements intact.     Pupils: Pupils are equal, round, and reactive to light.  Cardiovascular:     Rate and Rhythm: Normal rate and regular rhythm.  Pulmonary:     Effort: Pulmonary effort is normal.     Breath sounds: Normal breath sounds.  Abdominal:     General: Bowel sounds are normal. There is no distension.     Palpations: Abdomen is soft.     Tenderness: There is no abdominal tenderness.  Musculoskeletal:  Cervical back: Normal range of motion and neck supple.     Right lower leg: No edema.     Left lower leg: No edema.  Neurological:     General: No focal deficit present.     Mental Status: He is alert.  Psychiatric:        Mood and Affect: Mood normal.          Assessment & Plan:

## 2022-10-31 NOTE — Assessment & Plan Note (Signed)
He has f/u with his PCP.  Recent steroids, inhalers renewed  Appreciate this.

## 2022-11-01 DIAGNOSIS — S8002XD Contusion of left knee, subsequent encounter: Secondary | ICD-10-CM | POA: Diagnosis not present

## 2022-11-01 LAB — T-HELPER CELLS (CD4) COUNT (NOT AT ARMC)
CD4 % Helper T Cell: 22 % — ABNORMAL LOW (ref 33–65)
CD4 T Cell Abs: 350 /uL — ABNORMAL LOW (ref 400–1790)

## 2022-11-02 ENCOUNTER — Ambulatory Visit: Payer: Medicare Other | Admitting: Neurology

## 2022-11-02 LAB — HIV-1 RNA ULTRAQUANT REFLEX TO GENTYP+
HIV1 RNA # SerPl PCR: 45300 copies/mL
HIV1 RNA Plas PCR-Log#: 4.656 log10copy/mL

## 2022-11-03 ENCOUNTER — Other Ambulatory Visit: Payer: Self-pay | Admitting: Neurology

## 2022-11-03 ENCOUNTER — Telehealth: Payer: Self-pay | Admitting: Internal Medicine

## 2022-11-03 DIAGNOSIS — R1310 Dysphagia, unspecified: Secondary | ICD-10-CM

## 2022-11-03 NOTE — Telephone Encounter (Signed)
Casey Brennan, Thanks for reaching out. I have reviewed the patient's history as well as the patient's most recent esophagram.  I also reviewed his 2020 EGD which did show a Schatzki ring which was dilated for issues of dysphagia. Although the barium swallow should find most etiologies for difficulty swallowing, I would strongly consider potentially just going forward with scheduling the patient for an EGD. I think if we let Casey Brennan know when he returns, about this, he may want to just go ahead and bring him in for EGD. But I will defer that to his primary gastroenterologist who should be back next week. If Casey Brennan is away next week and then I will give some other insights or consider potential direct EGD, but again we will have to let Casey Brennan have an opportunity to weigh in first.  Thanks. Casey Brennan

## 2022-11-03 NOTE — Telephone Encounter (Signed)
Dr. Meridee Score, as DOD today please see note below.   Dr. Rhea Belton patient with a hx of dysphagia requiring EGD with dilation. Last EGD with dil in 2020. No available office visit until 11/22/22. Please advise, thanks.

## 2022-11-03 NOTE — Telephone Encounter (Signed)
Patient sister called stating that patient is having a really hard time swallowing. She requested a sooner appointment, advised her we do not have any sooner appointments available at the moment. She is requesting to speak to see if anything could be done before his appointment on 05/22. Please advise, thank you.

## 2022-11-05 ENCOUNTER — Other Ambulatory Visit: Payer: Self-pay | Admitting: Neurology

## 2022-11-05 MED ORDER — UBRELVY 100 MG PO TABS: 100.0000 mg | ORAL_TABLET | Freq: Two times a day (BID) | ORAL | 5 refills | Status: DC | PRN

## 2022-11-06 ENCOUNTER — Other Ambulatory Visit: Payer: 59

## 2022-11-06 MED ORDER — ALPRAZOLAM 0.5 MG PO TABS
ORAL_TABLET | ORAL | 0 refills | Status: DC
Start: 2022-11-06 — End: 2023-01-01

## 2022-11-06 NOTE — Telephone Encounter (Signed)
Pt scheduled for EGD with Dil on 11/08/22 at 10am with Dr. Leone Payor, instructions sent via mychart. Caregiver aware of appt. Amb ref in epic.

## 2022-11-06 NOTE — Addendum Note (Signed)
Addended by: Judi Cong on: 11/06/2022 09:08 AM   Modules accepted: Orders

## 2022-11-06 NOTE — Telephone Encounter (Signed)
I am happy to perform EGD and possible esophageal dilation on this patient in one of my open spots.  Casey Brennan go ahead and set it up.

## 2022-11-06 NOTE — Telephone Encounter (Signed)
Dr. Leone Payor, This patient of mine has been waiting for an appointment for dysphagia.  We did a barium esophagram which was largely unremarkable. I have a hospital week this week and no [facility] availability until early June. Would you be able to accommodate upper endoscopy on Wednesday at 10 AM or Thursday at 9 AM? You can review the barium esophagram and prior workup if needed Thanks for considering JMP

## 2022-11-06 NOTE — Telephone Encounter (Signed)
Your 1st available appt in the [facility] is 12/11/22.

## 2022-11-06 NOTE — Addendum Note (Signed)
Addended by: Selinda Michaels R on: 11/06/2022 01:35 PM   Modules accepted: Orders

## 2022-11-06 NOTE — Telephone Encounter (Signed)
Dr. Leone Payor has 5/8 at 10am and 5/9 at 9am Dr. Russella Dar has 5/9 at 10am

## 2022-11-06 NOTE — Telephone Encounter (Signed)
Agree [facility] EGD next available, if not available within 2 weeks let me know and I would likely say schedule with another provider JMP

## 2022-11-06 NOTE — Telephone Encounter (Signed)
Ok, could you let me know which [facility] MD has appts available this week and I will ask if they can do the EGD on my behalf. Thanks [employer]

## 2022-11-07 ENCOUNTER — Telehealth: Payer: Self-pay | Admitting: Neurology

## 2022-11-07 DIAGNOSIS — M7542 Impingement syndrome of left shoulder: Secondary | ICD-10-CM | POA: Diagnosis not present

## 2022-11-07 DIAGNOSIS — M25612 Stiffness of left shoulder, not elsewhere classified: Secondary | ICD-10-CM | POA: Diagnosis not present

## 2022-11-07 NOTE — Telephone Encounter (Signed)
PA completed for the pt on CMM/optum KEY: [id number] Will await determination

## 2022-11-07 NOTE — Telephone Encounter (Signed)
PA approved  Request Reference Number: [id number]. UBRELVY TAB 100MG  is approved through 07/03/2023. Your patient may now fill this prescription and it will be covered.

## 2022-11-08 ENCOUNTER — Ambulatory Visit (HOSPITAL_BASED_OUTPATIENT_CLINIC_OR_DEPARTMENT_OTHER): Payer: 59 | Admitting: Pulmonary Disease

## 2022-11-08 ENCOUNTER — Ambulatory Visit (AMBULATORY_SURGERY_CENTER): Payer: 59 | Admitting: Internal Medicine

## 2022-11-08 ENCOUNTER — Encounter: Payer: Self-pay | Admitting: Internal Medicine

## 2022-11-08 VITALS — BP 140/73 | HR 41 | Temp 98.7°F | Resp 13 | Ht 69.0 in | Wt 204.0 lb

## 2022-11-08 DIAGNOSIS — K2289 Other specified disease of esophagus: Secondary | ICD-10-CM | POA: Diagnosis not present

## 2022-11-08 DIAGNOSIS — K259 Gastric ulcer, unspecified as acute or chronic, without hemorrhage or perforation: Secondary | ICD-10-CM | POA: Diagnosis not present

## 2022-11-08 DIAGNOSIS — K222 Esophageal obstruction: Secondary | ICD-10-CM | POA: Diagnosis not present

## 2022-11-08 DIAGNOSIS — K2100 Gastro-esophageal reflux disease with esophagitis, without bleeding: Secondary | ICD-10-CM | POA: Diagnosis not present

## 2022-11-08 DIAGNOSIS — K209 Esophagitis, unspecified without bleeding: Secondary | ICD-10-CM

## 2022-11-08 DIAGNOSIS — R1310 Dysphagia, unspecified: Secondary | ICD-10-CM

## 2022-11-08 DIAGNOSIS — K2950 Unspecified chronic gastritis without bleeding: Secondary | ICD-10-CM | POA: Diagnosis not present

## 2022-11-08 MED ORDER — PANTOPRAZOLE SODIUM 40 MG PO TBEC
40.0000 mg | DELAYED_RELEASE_TABLET | Freq: Every day | ORAL | 3 refills | Status: DC
Start: 2022-11-08 — End: 2022-11-08

## 2022-11-08 MED ORDER — SODIUM CHLORIDE 0.9 % IV SOLN: 500.0000 mL | Freq: Once | INTRAVENOUS | Status: DC

## 2022-11-08 MED ORDER — PANTOPRAZOLE SODIUM 40 MG PO TBEC: 40.0000 mg | DELAYED_RELEASE_TABLET | Freq: Two times a day (BID) | ORAL | 3 refills | Status: DC

## 2022-11-08 NOTE — Patient Instructions (Addendum)
There were multiple findings:  Stomach ulcers and inflammation - biopsied. If you are taking BCs, Goody's, aspirin, Advil, Aleve - must stop  Esophagus narrowed and inflamed (dilated)  I have sent a new pantoprazole Rx - need to take twice a day  Will call with biopsy results  I appreciate the opportunity to care for you. Iva Boop, MD, [facility]  Resume all of your previous medications today as prescribed except the advil.  It can irritate your stomach terribly. Take your protonix twice daily on an empty stomach per Dr Leone Payor..  The medication can protect the stomach from ulcers, but don't take the aleve or ibuprofen until further notice.See your primary care for another pain medication.   YOU HAD AN ENDOSCOPIC PROCEDURE TODAY AT THE [facility]:   Refer to the procedure report that was given to you for any specific questions about what was found during the examination.  If the procedure report does not answer your questions, please call your gastroenterologist to clarify.  If you requested that your care partner not be given the details of your procedure findings, then the procedure report has been included in a sealed envelope for you to review at your convenience later.  YOU SHOULD EXPECT: Some feelings of bloating in the abdomen. Passage of more gas than usual.  Walking can help get rid of the air that was put into your GI tract during the procedure and reduce the bloating.   Please Note:  You might notice some irritation and congestion in your nose or some drainage.  This is from the oxygen used during your procedure.  There is no need for concern and it should clear up in a day or so.  SYMPTOMS TO REPORT IMMEDIATELY:  Following upper endoscopy (EGD)  Vomiting of blood or coffee ground material  New chest pain or pain under the shoulder blades  Painful or persistently difficult swallowing  New shortness of breath  Fever of 100F or higher  Black, tarry-looking  stools  For urgent or emergent issues, a gastroenterologist can be reached at any hour by calling [phone]. Do not use MyChart messaging for urgent concerns.    DIET:  We do recommend nothing by mouth until 11:45 am.  Then clear liquids from 11:45 am until 1245 pm.  The, you may have a soft diet for the rest of today. You may proceed to your regular diet tomorrow.  Drink plenty of fluids but you should avoid alcoholic beverages for 24 hours.  ACTIVITY:  You should plan to take it easy for the rest of today and you should NOT DRIVE or use heavy machinery until tomorrow (because of the sedation medicines used during the test).    FOLLOW UP: Our staff will call the number listed on your records the next business day following your procedure.  We will call around 7:15- 8:00 am to check on you and address any questions or concerns that you may have regarding the information given to you following your procedure. If we do not reach you, we will leave a message.     If any biopsies were taken you will be contacted by phone or by letter within the next 1-3 weeks.  Please call us at [phone] if you have not heard about the biopsies in 3 weeks.    SIGNATURES/CONFIDENTIALITY: You and/or your care partner have signed paperwork which will be entered into your electronic medical record.  These signatures attest to the fact that that  the information above on your After Visit Summary has been reviewed and is understood.  Full responsibility of the confidentiality of this discharge information lies with you and/or your care-partner.

## 2022-11-08 NOTE — Progress Notes (Signed)
Report given to PACU, vss 

## 2022-11-08 NOTE — Progress Notes (Signed)
1026 Robinul 0.1 mg IV given due large amount of secretions upon assessment.  MD made aware, vss  °

## 2022-11-08 NOTE — Progress Notes (Signed)
Pt's states no medical or surgical changes since previsit or office visit. 

## 2022-11-08 NOTE — Progress Notes (Signed)
[facility] History and Physical   Primary Care Physician:  Corwin Levins, MD   Reason for Procedure:    Encounter Diagnosis  Name Primary?   Dysphagia, unspecified type Yes     Plan:    EGD, possible esophageal dilation     HPI: Casey Brennan is a 62 y.o. 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   Pneumonia    hx of   Post concussion syndrome    resolved   Prostate cancer (HCC) 04/25/2013   gleason 3+3=6, volume 30 gm   Ulcer    hx of gastric    Past Surgical History:  Procedure Laterality Date   ACDCF  09/29/2020   ANTERIOR CERVICAL DECOMPRESSION/DISCECTOMY FUSION 4 LEVELS N/A 09/29/2020   Procedure: ANTERIOR CERVICAL DECOMPRESSION FUSION CERVICAL 3-CERVICAL 4 , CERVICAL 4 - CERVICAL 5, CERVICAL 5 - CERVICAL 6 WITH INSTRUMENTATION AND ALLOGRAFT;  Surgeon: Estill Bamberg, MD;  Location: [facility] OR;  Service: Orthopedics;  Laterality: N/A;   CARDIAC CATHETERIZATION  03-23-2009  DR COOPER   NORMAL CORONARY ARTERIES   CHOLECYSTECTOMY N/A 11/06/2014   Procedure: LAPAROSCOPIC CHOLECYSTECTOMY WITH INTRAOPERATIVE CHOLANGIOGRAM;  Surgeon: Jimmye Norman, MD;  Location: [facility] OR;  Service: General;  Laterality: N/A;   COLONOSCOPY  12/26/2011   Procedure: COLONOSCOPY;  Surgeon: Shirley Friar, MD;  Location: [facility] ENDOSCOPY;  Service: Endoscopy;  Laterality: N/A;   COLONOSCOPY      COLONOSCOPY WITH PROPOFOL N/A 07/29/2014   Procedure: COLONOSCOPY WITH PROPOFOL;  Surgeon: Shirley Friar, MD;  Location: [facility] ENDOSCOPY;  Service: Endoscopy;  Laterality: N/A;   CYSTOSCOPY N/A 05/18/2018   Procedure: CYSTOSCOPY REMOVAL FOREIGN BODY;  Surgeon: Ihor Gully, MD;  Location: [facility] ORS;  Service: Urology;  Laterality: N/A;   CYSTOSCOPY/RETROGRADE/URETEROSCOPY Bilateral 04/25/2013   Procedure: CYSTOSCOPY/ BILATERAL RETROGRADES; BLADDER BIOPSIES;  Surgeon: Sebastian Ache, MD;  Location[facility];  Service: Urology;  Laterality: Bilateral;   DENTAL EXAMINATION UNDER ANESTHESIA     ESOPHAGOGASTRODUODENOSCOPY  12/26/2011   Procedure: ESOPHAGOGASTRODUODENOSCOPY (EGD);  Surgeon: Shirley Friar, MD;  Location: [facility];  Service: Endoscopy;  Laterality: N/A;   ESOPHAGOGASTRODUODENOSCOPY (EGD) WITH PROPOFOL N/A 07/29/2014   Procedure: ESOPHAGOGASTRODUODENOSCOPY (EGD) WITH PROPOFOL;  Surgeon: Shirley Friar, MD;  Location: [facility] ENDOSCOPY;  Service: Endoscopy;  Laterality: N/A;   EXCISION CHRONIC LEFT BREAST ABSCESS  09-21-2010   EXCISIONAL BX LEFT BREAST MASS/  I  &  D LEFT BREAST ABSCESS  03-24-2009   KNEE ARTHROSCOPY Right 1985   left axilla biopsy  04/2013   left hip biopsy  10/2012   LYMPHADENECTOMY Bilateral 08/18/2013   Procedure: LYMPHADENECTOMY "PELVIC LYMPH NODE DISSECTION";  Surgeon: Sebastian Ache, MD;  Location: [facility] ORS;  Service: Urology;  Laterality:  Bilateral;   PROSTATE BIOPSY N/A 04/25/2013   Procedure: BIOPSY TRANSRECTAL ULTRASONIC PROSTATE (TUBP);  Surgeon: Sebastian Ache, MD;  Location: [facility];  Service: Urology;  Laterality: N/A;   ROBOT ASSISTED LAPAROSCOPIC RADICAL PROSTATECTOMY N/A 08/18/2013   Procedure: ROBOTIC ASSISTED LAPAROSCOPIC RADICAL PROSTATECTOMY;  Surgeon: Sebastian Ache, MD;  Location: [facility]S;  Service: Urology;  Laterality: N/A;   TOTAL KNEE ARTHROPLASTY Right 08/01/2016   08/17/16   TOTAL KNEE ARTHROPLASTY  Right 08/01/2016   Procedure: TOTAL KNEE ARTHROPLASTY;  Surgeon: Sheral Apley, MD;  Location: [facility] OR;  Service: Orthopedics;  Laterality: Right;   UPPER GASTROINTESTINAL ENDOSCOPY     UPPER LEG SOFT TISSUE BIOPSY Left 2012   [facility]    Prior to Admission medications   Medication Sig Start Date End Date Taking? Authorizing Provider  albuterol (VENTOLIN HFA) 108 (90 Base) MCG/ACT inhaler Inhale 2 puffs into the lungs every 6 (six) hours as needed for wheezing or shortness of breath. 10/30/22  Yes Corwin Levins, MD  Beclomethasone Dipropionate (QNASL) 80 MCG/ACT AERS Place 2 sprays into both nostrils daily. 07/17/22  Yes Pincus Sanes, MD  BIKTARVY 50-200-25 MG TABS tablet TAKE 1 TABLET BY MOUTH DAILY 08/02/22  Yes Ginnie Smart, MD  BREZTRI AEROSPHERE 160-9-4.8 MCG/ACT AERO INHALE 2 PUFFS INTO THE LUNGS IN THE MORNING AND AT BEDTIME 10/23/22  Yes Oretha Milch, MD  cetirizine (ZYRTEC) 10 MG tablet Take 1 tablet (10 mg total) by mouth daily. 11/11/21 11/11/22 Yes Corwin Levins, MD  Cholecalciferol (VITAMIN D3) 250 MCG (10000 UT) capsule Take 1 tablet by mouth daily. Nature's Made 04/14/21  Yes [provider]  dorzolamide (TRUSOPT) 2 % ophthalmic solution 1 drop 2 (two) times daily. 06/07/22  Yes [provider]  famotidine (PEPCID) 40 MG tablet Take 1 tablet (40 mg total) by mouth daily. 07/17/22  Yes Burns, Bobette Mo, MD  gabapentin (NEURONTIN) 300 MG capsule TAKE 1 CAPSULE(300 MG) BY MOUTH FOUR TIMES DAILY AS NEEDED FOR PAIN 08/02/22  Yes Dohmeier, Porfirio Mylar, MD  Glucos-Chond-Hyal Ac-Ca Fructo (MOVE FREE JOINT HEALTH ADVANCE PO) Take 1 tablet by mouth daily.   Yes [provider]  hydrALAZINE (APRESOLINE) 50 MG tablet TAKE 1 TABLET(50 MG) BY MOUTH THREE TIMES DAILY 09/20/22  Yes Patwardhan, Manish J, MD  HYDROcodone-acetaminophen (NORCO/VICODIN) 5-325 MG tablet Take 1 tablet by mouth every 6 (six) hours as needed. 06/09/22  Yes [provider]  methocarbamol (ROBAXIN)  500 MG tablet Take 500 mg by mouth 4 (four) times daily.   Yes [provider]  metoprolol succinate (TOPROL-XL) 100 MG 24 hr tablet TAKE 1 TABLET(100 MG) BY MOUTH DAILY WITH OR IMMEDIATELY FOLLOWING A MEAL 06/07/22  Yes Yates Decamp, MD  montelukast (SINGULAIR) 10 MG tablet Take 10 mg by mouth daily. 11/15/20  Yes [provider]  Multiple Vitamin (MULTIVITAMIN WITH MINERALS) TABS tablet Take 1 tablet by mouth daily after breakfast.    Yes [provider]  OVER THE COUNTER MEDICATION Take 2 capsules by mouth daily. Zhou: thyroid supplement   Yes [provider]  pantoprazole (PROTONIX) 40 MG tablet Take 1 tablet (40 mg total) by mouth daily. 06/14/22  Yes Corwin Levins, MD  rosuvastatin (CRESTOR) 20 MG tablet TAKE 1 TABLET(20 MG) BY MOUTH DAILY 08/28/22  Yes Corwin Levins, MD  tizanidine (ZANAFLEX) 6 MG capsule TAKE 1 CAPSULE(6 MG) BY MOUTH THREE TIMES DAILY 08/02/22  Yes Dohmeier, Porfirio Mylar, MD  Ubrogepant (UBRELVY) 100 MG TABS TAKE 1  TABLET BY MOUTH TWICE DAILY AS NEEDED( CAN TAKE A SECOND DOSE 2 HOURS AFTER FIRST DO NOT EXCEED 2 TABLETS A DAY) 11/06/22  Yes Dohmeier, Porfirio Mylar, MD  albuterol (PROVENTIL) (2.5 MG/3ML) 0.083% nebulizer solution Take 3 mLs (2.5 mg total) by nebulization every 6 (six) hours as needed for wheezing or shortness of breath. 01/25/22   Ginnie Smart, MD  ALPRAZolam (XANAX) 0.5 MG tablet TAKE 1 TABLET(0.5 MG) BY MOUTH TWICE DAILY AS NEEDED FOR SLEEP 11/06/22   Dohmeier, Porfirio Mylar, MD  chlorpheniramine-HYDROcodone (TUSSIONEX) 10-8 MG/5ML Take 5 mLs by mouth every 12 (twelve) hours as needed for cough. 10/05/22   Corwin Levins, MD  diphenhydrAMINE (BENADRYL) 25 MG tablet Take 1 tablet (25 mg total) by mouth every 6 (six) hours as needed. Patient taking differently: Take 25 mg by mouth every 6 (six) hours as needed for sleep, itching or allergies. 04/11/21   Hyman Hopes, Margaux, PA-C  EPINEPHrine 0.3 mg/0.3 mL IJ SOAJ injection Inject 0.3 mg into the muscle as  needed for anaphylaxis. 03/24/22   Corwin Levins, MD  hydrOXYzine (ATARAX) 50 MG tablet Take 1 tablet (50 mg total) by mouth 2 (two) times daily. 02/08/22   Patwardhan, Anabel Bene, MD  meclizine (ANTIVERT) 25 MG tablet Take 1 tablet (25 mg total) by mouth daily as needed. 11/11/21   Corwin Levins, MD  meloxicam (MOBIC) 7.5 MG tablet Take 1 tablet (7.5 mg total) by mouth daily. 10/26/22   Dohmeier, Porfirio Mylar, MD  mupirocin ointment (BACTROBAN) 2 % APPLY EXTERNALLY TO THE AFFECTED AREA TWICE DAILY FOR 3 WEEKS, PLACE COTTON BETWEEN TOES TO ALLOW BETTER AERATION AS NEEDED Patient taking differently: Apply 1 application  topically See admin instructions. APPLY EXTERNALLY TO THE AFFECTED AREA TWICE DAILY FOR 3 WEEKS, PLACE COTTON BETWEEN TOES TO ALLOW BETTER AERATION AS NEEDED 04/20/21   Ginnie Smart, MD  neomycin-polymyxin-hydrocortisone (CORTISPORIN) OTIC solution Place 4 drops into the left ear 4 (four) times daily. 09/11/22   Corwin Levins, MD  nitroGLYCERIN (NITROSTAT) 0.4 MG SL tablet Place 1 tablet (0.4 mg total) under the tongue every 5 (five) minutes as needed for chest pain. 02/08/22 05/09/22  Patwardhan, Anabel Bene, MD  ondansetron (ZOFRAN-ODT) 4 MG disintegrating tablet Take 1 tablet (4 mg total) by mouth every 8 (eight) hours as needed for nausea or vomiting. 07/17/22   Pincus Sanes, MD  torsemide (DEMADEX) 20 MG tablet Take 10 mg by mouth See admin instructions. Tuesday Thursday Saturday    [provider]  Ubrogepant (UBRELVY) 100 MG TABS Take 1 tablet (100 mg total) by mouth 2 (two) times daily as needed (can take a second dose 2 hours after first, do not exceed 2 a day.). 11/05/22   Windell Norfolk, MD  zolpidem (AMBIEN) 5 MG tablet Take 1-2 tablets (5-10 mg total) by mouth at bedtime as needed for sleep. Patient not taking: Reported on 04/23/2019 12/21/16 05/19/19  Kathlynn Grate, DO    Current Outpatient Medications  Medication Sig Dispense Refill   albuterol (VENTOLIN HFA) 108 (90 Base)  MCG/ACT inhaler Inhale 2 puffs into the lungs every 6 (six) hours as needed for wheezing or shortness of breath. 36 g 3   Beclomethasone Dipropionate (QNASL) 80 MCG/ACT AERS Place 2 sprays into both nostrils daily. 10.6 g 2   BIKTARVY 50-200-25 MG TABS tablet TAKE 1 TABLET BY MOUTH DAILY 30 tablet 11   BREZTRI AEROSPHERE 160-9-4.8 MCG/ACT AERO INHALE 2 PUFFS INTO THE LUNGS IN THE MORNING AND AT BEDTIME  10.7 g 0   cetirizine (ZYRTEC) 10 MG tablet Take 1 tablet (10 mg total) by mouth daily. 90 tablet 3   Cholecalciferol (VITAMIN D3) 250 MCG (10000 UT) capsule Take 1 tablet by mouth daily. Nature's Made     dorzolamide (TRUSOPT) 2 % ophthalmic solution 1 drop 2 (two) times daily.     famotidine (PEPCID) 40 MG tablet Take 1 tablet (40 mg total) by mouth daily. 30 tablet 5   gabapentin (NEURONTIN) 300 MG capsule TAKE 1 CAPSULE(300 MG) BY MOUTH FOUR TIMES DAILY AS NEEDED FOR PAIN 120 capsule 0   Glucos-Chond-Hyal Ac-Ca Fructo (MOVE FREE JOINT HEALTH ADVANCE PO) Take 1 tablet by mouth daily.     hydrALAZINE (APRESOLINE) 50 MG tablet TAKE 1 TABLET(50 MG) BY MOUTH THREE TIMES DAILY 270 tablet 1   HYDROcodone-acetaminophen (NORCO/VICODIN) 5-325 MG tablet Take 1 tablet by mouth every 6 (six) hours as needed.     methocarbamol (ROBAXIN) 500 MG tablet Take 500 mg by mouth 4 (four) times daily.     metoprolol succinate (TOPROL-XL) 100 MG 24 hr tablet TAKE 1 TABLET(100 MG) BY MOUTH DAILY WITH OR IMMEDIATELY FOLLOWING A MEAL 90 tablet 3   montelukast (SINGULAIR) 10 MG tablet Take 10 mg by mouth daily.     Multiple Vitamin (MULTIVITAMIN WITH MINERALS) TABS tablet Take 1 tablet by mouth daily after breakfast.      OVER THE COUNTER MEDICATION Take 2 capsules by mouth daily. Zhou: thyroid supplement     pantoprazole (PROTONIX) 40 MG tablet Take 1 tablet (40 mg total) by mouth daily. 90 tablet 3   rosuvastatin (CRESTOR) 20 MG tablet TAKE 1 TABLET(20 MG) BY MOUTH DAILY 90 tablet 3   tizanidine (ZANAFLEX) 6 MG capsule  TAKE 1 CAPSULE(6 MG) BY MOUTH THREE TIMES DAILY 90 capsule 11   Ubrogepant (UBRELVY) 100 MG TABS TAKE 1 TABLET BY MOUTH TWICE DAILY AS NEEDED( CAN TAKE A SECOND DOSE 2 HOURS AFTER FIRST DO NOT EXCEED 2 TABLETS A DAY) 48 tablet 1   albuterol (PROVENTIL) (2.5 MG/3ML) 0.083% nebulizer solution Take 3 mLs (2.5 mg total) by nebulization every 6 (six) hours as needed for wheezing or shortness of breath. 75 mL 3   ALPRAZolam (XANAX) 0.5 MG tablet TAKE 1 TABLET(0.5 MG) BY MOUTH TWICE DAILY AS NEEDED FOR SLEEP 60 tablet 0   chlorpheniramine-HYDROcodone (TUSSIONEX) 10-8 MG/5ML Take 5 mLs by mouth every 12 (twelve) hours as needed for cough. 115 mL 0   diphenhydrAMINE (BENADRYL) 25 MG tablet Take 1 tablet (25 mg total) by mouth every 6 (six) hours as needed. (Patient taking differently: Take 25 mg by mouth every 6 (six) hours as needed for sleep, itching or allergies.) 30 tablet 0   EPINEPHrine 0.3 mg/0.3 mL IJ SOAJ injection Inject 0.3 mg into the muscle as needed for anaphylaxis. 1 each 5   hydrOXYzine (ATARAX) 50 MG tablet Take 1 tablet (50 mg total) by mouth 2 (two) times daily. 60 tablet 1   meclizine (ANTIVERT) 25 MG tablet Take 1 tablet (25 mg total) by mouth daily as needed. 40 tablet 5   meloxicam (MOBIC) 7.5 MG tablet Take 1 tablet (7.5 mg total) by mouth daily. 21 tablet 1   mupirocin ointment (BACTROBAN) 2 % APPLY EXTERNALLY TO THE AFFECTED AREA TWICE DAILY FOR 3 WEEKS, PLACE COTTON BETWEEN TOES TO ALLOW BETTER AERATION AS NEEDED (Patient taking differently: Apply 1 application  topically See admin instructions. APPLY EXTERNALLY TO THE AFFECTED AREA TWICE DAILY FOR 3 WEEKS, PLACE COTTON  BETWEEN TOES TO ALLOW BETTER AERATION AS NEEDED) 66 g 1   neomycin-polymyxin-hydrocortisone (CORTISPORIN) OTIC solution Place 4 drops into the left ear 4 (four) times daily. 10 mL 0   nitroGLYCERIN (NITROSTAT) 0.4 MG SL tablet Place 1 tablet (0.4 mg total) under the tongue every 5 (five) minutes as needed for chest pain.  30 tablet 1   ondansetron (ZOFRAN-ODT) 4 MG disintegrating tablet Take 1 tablet (4 mg total) by mouth every 8 (eight) hours as needed for nausea or vomiting. 30 tablet 2   torsemide (DEMADEX) 20 MG tablet Take 10 mg by mouth See admin instructions. Tuesday Thursday Saturday     Ubrogepant (UBRELVY) 100 MG TABS Take 1 tablet (100 mg total) by mouth 2 (two) times daily as needed (can take a second dose 2 hours after first, do not exceed 2 a day.). 10 tablet 5   Current Facility-Administered Medications  Medication Dose Route Frequency Provider Last Rate Last Admin   0.9 %  sodium chloride infusion  500 mL Intravenous Once Iva Boop, MD        Allergies as of 11/08/2022 - Review Complete 11/08/2022  Allergen Reaction Noted   Amoxapine Shortness Of Breath 03/06/2018   Chocolate Shortness Of Breath and Other (See Comments) 05/22/2016   Ciprofloxacin Anaphylaxis, Dermatitis, Itching, Nausea And Vomiting, Shortness Of Breath, Swelling, and Hives 09/13/2021   Descovy [emtricitabine-tenofovir af] Shortness Of Breath 08/31/2017   Doxycycline hyclate Shortness Of Breath and Swelling 08/04/2022   Genvoya [elviteg-cobic-emtricit-tenofaf] Shortness Of Breath 08/31/2017   Morphine and related Shortness Of Breath, Itching, and Other (See Comments) 06/13/2013   Other Anaphylaxis 06/09/2022   Oxycodone-acetaminophen Shortness Of Breath 11/11/2020   Penicillins Shortness Of Breath and Rash    Sulfa antibiotics Anaphylaxis and Other (See Comments) 10/17/2015   Tomato Shortness Of Breath 05/22/2016   Vancomycin Shortness Of Breath and Other (See Comments) 09/25/2016   Darunavir Itching 04/03/2021   Amlodipine Other (See Comments) 11/24/2021   Carvedilol  04/08/2020   Flagyl [metronidazole] Itching 09/25/2016   Milk-related compounds  05/18/2019   Morphine Other (See Comments) 11/16/2020   Oxycodone hcl Other (See Comments) 10/17/2022   Penicillin g Other (See Comments) 11/16/2020   Toradol  [ketorolac tromethamine] Nausea And Vomiting 11/12/2017   Acetaminophen Rash and Other (See Comments) 11/11/2014   Tramadol Itching, Nausea And Vomiting, and Other (See Comments) 07/28/2014    Family History  Problem Relation Age of Onset   Stroke Father    Diabetes Father    Cancer Father        brain cancer   Asthma Father    Hypertension Sister    Cancer Maternal Uncle        prostate cancer   Asthma Sister    Colon cancer Neg Hx    Colon polyps Neg Hx    Esophageal cancer Neg Hx    Stomach cancer Neg Hx    Rectal cancer Neg Hx     Social History   Socioeconomic History   Marital status: Single    Spouse name: Not on file   Number of children: 1   Years of education: College   Highest education level: Bachelor's degree (e.g., BA, AB, BS)  Occupational History   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 declined condoms  Other Topics Concern   Not on file  Social History Narrative   Sister April stays with him currently although he hopes to return to truck driving. 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 Sister is starting medical school next year.   Patient is divorced and lives with his sister.   Patient has one adult son.   Patient is part-time, driving a truck.   Patient has a college education.   Patient is right-handed.   Patient drinks 2-3 cups of coffee and a 4-5 cups of tea.    Social Determinants of Health   Financial Resource Strain: Low Risk  (10/03/2022)   Overall Financial Resource Strain (CARDIA)    Difficulty of Paying Living Expenses: Not very hard  Food Insecurity: No Food Insecurity (10/03/2022)   Hunger Vital Sign    Worried About Running Out of Food in the Last Year: Never true    Ran Out of Food in the Last Year: Never true  Transportation Needs: No Transportation Needs (10/03/2022)   PRAPARE - Administrator, Civil Service (Medical): No    Lack of Transportation (Non-Medical): No  Physical Activity: Sufficiently Active (10/03/2022)   Exercise Vital Sign    Days of Exercise per Week: 7 days    Minutes of Exercise per Session: 30 min  Stress: No Stress Concern Present (10/03/2022)   [employer] of Occupational Health - Occupational Stress Questionnaire    Feeling of Stress : Not at all  Social Connections: Unknown (10/03/2022)   Social Connection and Isolation Panel [NHANES]    Frequency of Communication with Friends and Family: More than three times a week    Frequency of Social Gatherings with Friends and Family: More than three times a week    Attends Religious Services: More than 4 times per year    Active Member of [employer] or Organizations: Yes    Attends Banker Meetings: More than 4 times per year    Marital Status: Patient declined  Intimate Partner Violence: Not At Risk (04/07/2022)   Humiliation, Afraid, Rape, and Kick questionnaire    Fear of Current or Ex-Partner: No    Emotionally Abused: No    Physically Abused: No    Sexually Abused: No    Review of Systems:  All other review of systems negative except as mentioned in the HPI.  Physical Exam: Vital signs BP (!) 175/113   Pulse (!) 53   Temp 98.7 F (37.1 C)   Ht 5\' 9"  (1.753 m)   Wt 204 lb (92.5 kg)   SpO2 95%   BMI 30.13 kg/m   General:   Alert,  Well-developed, well-nourished, pleasant and cooperative in NAD Lungs:  Clear throughout to  auscultation.   Heart:  Regular rate and rhythm; no murmurs, clicks, rubs,  or gallops. Abdomen:  Soft, nontender and nondistended. Normal bowel sounds.   Neuro/Psych:  Alert and cooperative. Normal mood and affect. A and O x  3   @Hitoshi Werts  Sena Slate, MD, [facility] [phone] (pager) 11/08/2022 10:20 AM@

## 2022-11-08 NOTE — Op Note (Addendum)
[facility] Patient Name: Casey Brennan Procedure Date: 11/08/2022 10:12 AM MRN: [id number] Endoscopist: Iva Boop , MD, [id number] Age: 62 Referring MD:  Date of Birth: 07-07-1960 Gender: Male Account #: [id number] Procedure:                Upper GI endoscopy Indications:              Dysphagia Medicines:                Monitored Anesthesia Care Procedure:                Pre-Anesthesia Assessment:                           - Prior to the procedure, a History and Physical                            was performed, and patient medications and                            allergies were reviewed. The patient's tolerance of                            previous anesthesia was also reviewed. The risks                            and benefits of the procedure and the sedation                            options and risks were discussed with the patient.                            All questions were answered, and informed consent                            was obtained. Prior Anticoagulants: The patient has                            taken no anticoagulant or antiplatelet agents. ASA                            Grade Assessment: II - A patient with mild systemic                            disease. After reviewing the risks and benefits,                            the patient was deemed in satisfactory condition to                            undergo the procedure.                           After obtaining informed consent, the endoscope was  passed under direct vision. Throughout the                            procedure, the patient's blood pressure, pulse, and                            oxygen saturations were monitored continuously. The                            GIF [id number] #[id number] was introduced through the                            mouth, and advanced to the second part of duodenum.                            The upper GI endoscopy was accomplished  without                            difficulty. The patient tolerated the procedure                            well. Scope In: Scope Out: Findings:                 One benign-appearing, intrinsic moderate                            (circumferential scarring or stenosis; an endoscope                            may pass) stenosis was found at the                            gastroesophageal junction. It was intermittently                            seen. The stenosis was traversed. A TTS dilator was                            passed through the scope. Dilation with an 18-19-20                            mm balloon dilator was performed to 20 mm. The                            dilation site was examined and showed mild mucosal                            disruption. Estimated blood loss was minimal.                           LA Grade A (one or more mucosal breaks less than 5  mm, not extending between tops of 2 mucosal folds)                            esophagitis with no bleeding was found in the                            distal esophagus. Biopsies were taken with a cold                            forceps for histology. Verification of patient                            identification for the specimen was done. Estimated                            blood loss was minimal.                           Few non-bleeding cratered gastric ulcers with no                            stigmata of bleeding were found in the gastric                            antrum. The largest lesion was 20 mm in largest                            dimension. Biopsies were taken with a cold forceps                            for histology. Verification of patient                            identification for the specimen was done. Estimated                            blood loss was minimal.                           Multiple dispersed small erosions with no stigmata                            of  recent bleeding were found in the gastric body.                            Biopsies were taken with a cold forceps for                            histology. Verification of patient identification                            for the specimen was done. Estimated blood loss was  minimal.                           The exam was otherwise without abnormality.                           The cardia and gastric fundus were normal on                            retroflexion. Complications:            No immediate complications. Estimated Blood Loss:     Estimated blood loss was minimal. Impression:               - Benign-appearing esophageal stenosis.                            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                            Dilated. 20 mm.                           - LA Grade A reflux esophagitis with no bleeding.                            Biopsied.                           - Non-bleeding gastric ulcers with no stigmata of                            bleeding. Biopsied. Largest 2 cm and deep                           - Erosive gastropathy with no stigmata of recent                            bleeding. Biopsied.                           - The examination was otherwise normal. Recommendation:           - Patient has a contact number available for                            emergencies. The signs and symptoms of potential                            delayed complications were discussed with the                            patient. Return to normal activities tomorrow.                            Written discharge instructions were provided to the  patient.                           - Clear liquids x 1 hour then soft foods rest of                            day. Start prior diet tomorrow.                           - Continue present medications.                           - Await pathology results.                            - Change pantoprazole to bid                           If taking NSAIDS - stop RECOVERY DISCUSSION - not                            adherent to daily PPI and taking Aleve - Iva Boop, MD 11/08/2022 10:57:38 AM This report has been signed electronically.

## 2022-11-09 ENCOUNTER — Telehealth: Payer: Self-pay | Admitting: *Deleted

## 2022-11-09 DIAGNOSIS — M25512 Pain in left shoulder: Secondary | ICD-10-CM | POA: Diagnosis not present

## 2022-11-09 NOTE — Telephone Encounter (Signed)
  Follow up Call-     11/08/2022    9:23 AM  Call back number  Post procedure Call Back phone  # [phone]  Permission to leave phone message Yes     Patient questions:  Do you have a fever, pain , or abdominal swelling? No. Pain Score  0 *  Have you tolerated food without any problems? Yes.    Have you been able to return to your normal activities? Yes.    Do you have any questions about your discharge instructions: Diet   No. Medications  No. Follow up visit  No.  Do you have questions or concerns about your Care? No.  Actions: * If pain score is 4 or above: No action needed, pain <4.

## 2022-11-10 ENCOUNTER — Ambulatory Visit (HOSPITAL_BASED_OUTPATIENT_CLINIC_OR_DEPARTMENT_OTHER): Payer: 59

## 2022-11-10 ENCOUNTER — Encounter (HOSPITAL_BASED_OUTPATIENT_CLINIC_OR_DEPARTMENT_OTHER): Payer: Self-pay | Admitting: Pulmonary Disease

## 2022-11-10 ENCOUNTER — Telehealth (HOSPITAL_BASED_OUTPATIENT_CLINIC_OR_DEPARTMENT_OTHER): Payer: Self-pay | Admitting: Pulmonary Disease

## 2022-11-10 ENCOUNTER — Ambulatory Visit (INDEPENDENT_AMBULATORY_CARE_PROVIDER_SITE_OTHER): Payer: 59 | Admitting: Pulmonary Disease

## 2022-11-10 ENCOUNTER — Encounter: Payer: Self-pay | Admitting: *Deleted

## 2022-11-10 ENCOUNTER — Telehealth: Payer: Self-pay | Admitting: Infectious Diseases

## 2022-11-10 VITALS — BP 128/78 | HR 56 | Ht 69.0 in | Wt 211.3 lb

## 2022-11-10 DIAGNOSIS — R059 Cough, unspecified: Secondary | ICD-10-CM | POA: Diagnosis not present

## 2022-11-10 DIAGNOSIS — J441 Chronic obstructive pulmonary disease with (acute) exacerbation: Secondary | ICD-10-CM

## 2022-11-10 DIAGNOSIS — J449 Chronic obstructive pulmonary disease, unspecified: Secondary | ICD-10-CM | POA: Diagnosis not present

## 2022-11-10 DIAGNOSIS — R062 Wheezing: Secondary | ICD-10-CM | POA: Diagnosis not present

## 2022-11-10 DIAGNOSIS — J479 Bronchiectasis, uncomplicated: Secondary | ICD-10-CM

## 2022-11-10 NOTE — Progress Notes (Signed)
   Subjective:    Patient ID: Casey Brennan, male    DOB: 22-Mar-1961, 62 y.o.   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   HIV - diagnosed 60 ,last CD 4 479 03/2020 He smoked more than 30 pack years before he quit in 2012 -schatzki ring - dilated   Chief Complaint  Patient presents with   Follow-up    Pt states his breathing is about the same since last visit. Is coughing up phlegm that is thick and yellow in color. Also has complaints of wheezing.   Last OV 01/2022 -PFTs were advised but not done yet. Accompanied by his sister Casey Brennan corroborates history. His viral load is rising and CD4 count is low, changed to antiretroviral this plan by infectious disease. Reviewed neurology record he had an episode of viral meningitis. He had falls and seems to have significant cervical disc disease, discectomy is planned by orthopedics.  He did ports increased cough with yellow sputum, was given Tussionex and prednisone by PCP.  He is compliant with Breztri.  He has been using Mucinex regularly, now sputum is white and foamy     Significant tests/ events reviewed   10/2022 CT c/A/P >> unchanged nodule, mild right upper lobe cystic bronchiectasis, unchanged 02/26/22 CTA chest >> RUL 4mm nodule, likely LN CT cors 04/2020 ca score 0   spirometry 07/22/2014  FEV1  1.12 (34%) ratio 59  AEC 900  Home sleep test 07/2021 mild, AHI 8/hour, low sat 88%, worse when supine  Review of Systems neg for any significant sore throat, dysphagia, itching, sneezing, nasal congestion or excess/ purulent secretions, fever, chills, sweats, unintended wt loss, pleuritic or exertional cp, hempoptysis, orthopnea pnd or change in chronic leg swelling. Also denies presyncope, palpitations, heartburn, abdominal pain, nausea, vomiting, diarrhea or change in  bowel or urinary habits, dysuria,hematuria, rash, arthralgias, visual complaints, headache, numbness weakness or ataxia.     Objective:   Physical Exam  Gen. Pleasant, well-nourished, in no distress ENT - no thrush, no pallor/icterus,no post nasal drip Neck: No JVD, no thyromegaly, no carotid bruits Lungs: no use of accessory muscles, no dullness to percussion, right basal rales no rhonchi  Cardiovascular: Rhythm regular, heart sounds  normal, no murmurs or gallops, no peripheral edema Musculoskeletal: No deformities, no cyanosis or clubbing        Assessment & Plan:

## 2022-11-10 NOTE — Assessment & Plan Note (Addendum)
His dyspnea is worse than baseline over the last month she may be related to increase in pollen.  He has been treated with a course of prednisone and would like to avoid more.  I asked him to use albuterol nebs on a daily basis. He will continue on Breztri.  He is not clearly having infective exacerbation but has right lower lobe crackles on exam and we will obtain a chest x-ray.  If he has a new infiltrate then we can give him a round of antibiotics PFTs can be scheduled after acute issues resolved.  He has severe airway obstruction based on old spirometry from 2016 but postbronchodilator testing was never performed

## 2022-11-10 NOTE — Telephone Encounter (Signed)
Called Mr Kahler (spoke with April)  to let him know that his VL was elevated and we sent genotype was sent.  CD4 still good,  Will call them back with results of genotype.

## 2022-11-10 NOTE — Patient Instructions (Signed)
x chest x-ray today  X schedule PFTs  Use albuterol nebs daily for 1 week

## 2022-11-10 NOTE — Telephone Encounter (Signed)
Dr. Alva, please advise on this. 

## 2022-11-10 NOTE — Assessment & Plan Note (Signed)
Mild, Localized to right upper lobe. Airway clearance does not seem to be an issue.  He will continue on Mucinex

## 2022-11-10 NOTE — Telephone Encounter (Signed)
Patient sister states she forgot to mention her brothers latest scan shows nodule and swelling on the upper right lung. Asking for a call regarding most recent results. Please advise and call [phone] April back. Last imaging regarding chest being done besides today was 10/10/22.

## 2022-11-13 ENCOUNTER — Other Ambulatory Visit: Payer: Self-pay | Admitting: Infectious Diseases

## 2022-11-13 DIAGNOSIS — B20 Human immunodeficiency virus [HIV] disease: Secondary | ICD-10-CM

## 2022-11-13 LAB — REFLEX TO GENOSURE(R) MG

## 2022-11-13 NOTE — Telephone Encounter (Signed)
Called and spoke with April. April verbalized understanding. April stated she also wanted to see if patient could be called in tussinex for his cough.   RA, please advise.

## 2022-11-14 NOTE — Telephone Encounter (Signed)
Called and spoke with Casey Brennan. She verbalized understanding.   Nothing further needed.

## 2022-11-15 ENCOUNTER — Other Ambulatory Visit: Payer: Self-pay | Admitting: Orthopedic Surgery

## 2022-11-15 DIAGNOSIS — M25512 Pain in left shoulder: Secondary | ICD-10-CM | POA: Diagnosis not present

## 2022-11-16 ENCOUNTER — Encounter: Payer: Self-pay | Admitting: Neurology

## 2022-11-16 ENCOUNTER — Ambulatory Visit (INDEPENDENT_AMBULATORY_CARE_PROVIDER_SITE_OTHER): Payer: 59 | Admitting: Neurology

## 2022-11-16 ENCOUNTER — Telehealth: Payer: Self-pay | Admitting: Pharmacy Technician

## 2022-11-16 ENCOUNTER — Other Ambulatory Visit: Payer: Self-pay | Admitting: Internal Medicine

## 2022-11-16 VITALS — BP 135/91 | HR 59 | Ht 69.0 in | Wt 205.0 lb

## 2022-11-16 DIAGNOSIS — A879 Viral meningitis, unspecified: Secondary | ICD-10-CM | POA: Diagnosis not present

## 2022-11-16 DIAGNOSIS — G63 Polyneuropathy in diseases classified elsewhere: Secondary | ICD-10-CM

## 2022-11-16 DIAGNOSIS — G4459 Other complicated headache syndrome: Secondary | ICD-10-CM

## 2022-11-16 DIAGNOSIS — M4722 Other spondylosis with radiculopathy, cervical region: Secondary | ICD-10-CM

## 2022-11-16 DIAGNOSIS — C7951 Secondary malignant neoplasm of bone: Secondary | ICD-10-CM | POA: Insufficient documentation

## 2022-11-16 DIAGNOSIS — B20 Human immunodeficiency virus [HIV] disease: Secondary | ICD-10-CM | POA: Diagnosis not present

## 2022-11-16 DIAGNOSIS — L97511 Non-pressure chronic ulcer of other part of right foot limited to breakdown of skin: Secondary | ICD-10-CM | POA: Diagnosis not present

## 2022-11-16 DIAGNOSIS — I119 Hypertensive heart disease without heart failure: Secondary | ICD-10-CM | POA: Diagnosis not present

## 2022-11-16 DIAGNOSIS — I43 Cardiomyopathy in diseases classified elsewhere: Secondary | ICD-10-CM

## 2022-11-16 MED ORDER — QULIPTA 30 MG PO TABS: 30.0000 mg | ORAL_TABLET | Freq: Every day | ORAL | 5 refills | Status: DC

## 2022-11-16 MED ORDER — CYCLOBENZAPRINE HCL 10 MG PO TABS: 10.0000 mg | ORAL_TABLET | Freq: Every day | ORAL | 3 refills | Status: DC

## 2022-11-16 NOTE — Telephone Encounter (Signed)
Patient Advocate Encounter   Received notification that prior authorization for Qulipta 30MG  tablets is required.   PA submitted on 11/16/2022 Key [id number]  Insurance [employer] Medicare Part D Electronic Prior Authorization Form Status is pending

## 2022-11-16 NOTE — Telephone Encounter (Signed)
Patient Advocate Encounter  Prior Authorization for Qulipta 30MG  tablets  has been approved.    PA# [id number] Insurance [employer] Medicare Part D Electronic Prior Authorization Form  Effective dates: 11/16/2022 through 07/03/2023

## 2022-11-16 NOTE — Progress Notes (Signed)
Provider:  Melvyn Novas, MD  Primary Care Physician:  Casey Levins, MD [address] [place] [address]     Referring Provider: Corwin Levins, Md [address] [place],  [place] [address]          Chief Complaint according to patient   Patient presents with:     New Patient (Initial Visit)           HISTORY OF PRESENT ILLNESS:  Casey Brennan is a 62 y.o. male patient who is here for revisit 11/16/2022 for  a revisit .  Rm 1 with sister Casey Brennan Pt is well, reports he is having some numbness in R arm since cervical surgery in 2022. His hand and feet get cold and nails turn blue, has progressed since 2021.  He had been diagnosed with viral meningitis in 06-2022. His headaches were much improved after LP and steroids- , but he has a constant neck and occipital pain and radiating to the top of the head via the nape and back  of the scull.    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 I had offered meloxicam , but he reported an NSADS allergy.  I will instead recommend Casey Brennan or Casey Brennan , and apparently CaseyDumanski has already prescribed this medication- 5 mg Casey Brennan.  This is until surgery- bid po.    Plan )I had tried to treat the throbbing HA with Casey Brennan , initially with success- and he took the medication several weeks daily , but  has not much relief now.  Will use Casey Brennan , which is preventive , too.  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 He reports  that his fingers turn blue with pain when he touch cold surfaces or in a cold environment.  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   Plan )He should use any vaso-constrictor sparingly- especially decongestants.   Status : HIV positive , CD4 count was 350, CD4 Helper cells are 40.  GI has worked on his  swallowing , this has improved.       Casey Brennan is a 62 y.o. male patient who is here for revisit 10/26/2022 for  HA .  Chief concern according to patient :  Still having headaches: he is still having daily headaches. He feels a lot of pressure in back of head and some pain in front of head.  He also mentions having some concern about swallowing.  The patient has been choking at times over dinner, he has seen Casey. Rhea Brennan, GI, and it was felt that this is a neurological problem.  Casey Brennan had a surgical access to his cervical spine anterior fusion so there is probably some scar tissue and this definitely can affect swallowing as we know.  However his headaches  are not alone related to the neck pain.    He does have headaches when he wakes up he stays with headaches during the day he falls asleep with headaches and these headaches are not associated with nausea or photophobia , but some lightheadedness.  VERTIGO, the room starts spinning, counter clock wise. He had some of this before his neck surgery which also resolved  left arm radiculopathy.  September 29 2020.   He had 2 falls in March 2024,  hit his head and neck, and now the right arm tingles.      His PCP ordered a neck in mage MRI cervical after the falls: C5-C6: Endplate spurring with left greater than right facet and uncovertebral hypertrophy. Severe left foraminal stenosis and mild right foraminal stenosis. Mild canal stenosis.   C6-C7: Left greater than right facet and uncovertebral hypertrophy. Resulting moderate to severe left and moderate right foraminal stenosis. No significant canal stenosis.   C7-T1: Left greater than right facet and uncovertebral hypertrophy. Resulting moderate to severe bilateral foraminal stenosis. No significant canal stenosis.   Within this limitation, there is mild bony neural foraminal stenosis on the left at C2-C3, C3-C4. There is moderate to severe (left greater than right) bilateral bony  neural foraminal stenosis at C5-C6 and C6-C7.   Casey Brennan:" Dysphagia. History of cervical fusion. Globus sensation in proximal esophagus primarily with solid foods. We did a barium esophagram which shows that he does have brief penetration of the fluid near his vocal cords but without aspiration.  This is likely neurologic and postsurgical. "       10-27-2021; RV after December Hospitalization- 12-27 - 22 through 07-07-2021, for viral meningitis.  After our last visit he had been showing signs of meningitis, and he was febrile. Had been seen in ED and was send home "to follow up with his Neurologist ". After our visit in Dec he was promptly admited, projectile vomiting, clouded awareness, stiff neck.  After he was hospitalized and treated for his febrile illness at [facility], as an HIV positive immunocompromised patient  with viral meningitis.    After his discharge he had significant improvement in headache and migrainous headaches, no longer dizzy he did not have the same tinnitus that he had for several days nonstop prior and he felt less achy and throbbing.  20 sister then informed me of his snoring and we ordered a HST; His home sleep test was performed as a WatchPAT test total recording time of 10 hours 9 minutes was obtained and he slept for 8 hours and 25 minutes of the recording.  He had over 27% REM sleep.  His AHI was very mild 8/h during REM sleep there is always more apnea than in non-REM sleep in his case REM AHI was 10.4 and non-REM AHI was 6.9.  When he slept on his back his AHI rose to 13 an hour when he slept on his side he had literally only 3/h.  Snoring was present for about 20% of the total sleep time and there was no significant oxygen desaturation.  So I thought it was enough to treat him not with any CPAP but to avoid sleeping on his back.  I gave him the option to try CPAP if he wants.  He has had facial hair and I would have used a nasal CPAP that seals better under the  nose.  But but he may have mild apnea only when he is does not sleep on his back there is still the possibility that he has loud snoring in spite of that.   So I asked him and his sister and both state he is a mild snorer.    Headaches were responsive to [place].  [place] is 3/ 1/ 1/ 1/ 2/ 3/ 1/ 1/ - every other day a nap 1-2 hours.       Review of Systems: Out of a complete 14 system  review, the patient complains of only the following symptoms, and all other reviewed systems are negative.:   He had been diagnosed with viral meningitis in 06-2022. His headaches were much improved after LP and steroids- , but he has a constant neck and occipital pain and radiating to the top of the head via the nape and back  of the scull.    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 I had offered meloxicam , but he reported an NSADS allergy.  I will instead recommend Casey Brennan or Casey Brennan , and apparently CaseyDumanski has already prescribed this medication- 5 mg Casey Brennan.  This is until surgery- bid po.    Plan )I had tried to treat the throbbing HA with Casey Brennan , initially with success- and he took the medication several weeks daily , but  has not much relief now.  Will use Casey Brennan , which is preventive , too.  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 He reports  that his fingers turn blue with pain when he touch cold surfaces or in a cold environment.  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   Plan )He should use any vaso-constrictor sparingly- especially decongestants.   Status : HIV positive , CD4 count was 350, CD4 Helper cells are 40.  GI has worked on his swallowing , this has improved.    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 declined condoms  Other Topics Concern   Not on file  Social History Narrative   Sister Casey Brennan stays with him currently although he hopes to return to truck driving. 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 Sister is starting medical school next year.   Patient is divorced and lives with his sister.   Patient has one adult son.   Patient is part-time, driving a truck.   Patient has a college education.   Patient is right-handed.   Patient drinks 2-3 cups of coffee and a 4-5 cups of tea.   Social Determinants of Health   Financial Resource Strain: Low Risk  (10/03/2022)   Overall Financial Resource Strain (CARDIA)    Difficulty of Paying Living Expenses: Not very hard  Food Insecurity: No Food Insecurity (10/03/2022)   Hunger Vital Sign  Worried About Programme researcher, broadcasting/film/video in the Last Year: Never true    Ran Out of Food in the Last Year: Never true  Transportation Needs: No Transportation Needs (10/03/2022)   PRAPARE - Administrator, Civil Service (Medical): No    Lack of Transportation (Non-Medical): No  Physical Activity: Sufficiently Active (10/03/2022)   Exercise Vital Sign    Days of Exercise per Week: 7 days    Minutes of  Exercise per Session: 30 min  Stress: No Stress Concern Present (10/03/2022)   [employer] of Occupational Health - Occupational Stress Questionnaire    Feeling of Stress : Not at all  Social Connections: Unknown (10/03/2022)   Social Connection and Isolation Panel [NHANES]    Frequency of Communication with Friends and Family: More than three times a week    Frequency of Social Gatherings with Friends and Family: More than three times a week    Attends Religious Services: More than 4 times per year    Active Member of [employer] or Organizations: Yes    Attends Engineer, structural: More than 4 times per year    Marital Status: Patient declined    Family History  Problem Relation Age of Onset   Stroke Father    Diabetes Father    Cancer Father        brain cancer   Asthma Father    Hypertension Sister    Cancer Maternal Uncle        prostate cancer   Asthma Sister    Colon cancer Neg Hx    Colon polyps Neg Hx    Esophageal cancer Neg Hx    Stomach cancer Neg Hx    Rectal cancer Neg Hx     Past Medical History:  Diagnosis Date   Asthma    very rare   Chronic anemia    normocytic   Chronic folliculitis    Environmental and seasonal allergies    H/O pericarditis    2010--  myopercarditis--  resolved   Headache(784.0)    HX SEVERE FRONTAL HA'S   Hiatal hernia    History of gastric ulcer    History of kidney stones    History of MRSA infection 2010   infected boil   HIV (human immunodeficiency virus infection) (HCC) 1988   Hypertension    Lytic bone lesion of hip    WORK-UP DONE BY ONCOLOGIST Casey HA --  NOT MALIGNANT   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   Pneumonia    hx of   Post concussion syndrome    resolved   Prostate cancer (HCC) 04/25/2013   gleason 3+3=6, volume 30 gm   Ulcer    hx of gastric    Past Surgical History:  Procedure Laterality Date   ACDCF  09/29/2020   ANTERIOR CERVICAL DECOMPRESSION/DISCECTOMY FUSION 4 LEVELS N/A 09/29/2020    Procedure: ANTERIOR CERVICAL DECOMPRESSION FUSION CERVICAL 3-CERVICAL 4 , CERVICAL 4 - CERVICAL 5, CERVICAL 5 - CERVICAL 6 WITH INSTRUMENTATION AND ALLOGRAFT;  Surgeon: Estill Bamberg, MD;  Location: [facility] OR;  Service: Orthopedics;  Laterality: N/A;   CARDIAC CATHETERIZATION  03-23-2009  Casey COOPER   NORMAL CORONARY ARTERIES   CHOLECYSTECTOMY N/A 11/06/2014   Procedure: LAPAROSCOPIC CHOLECYSTECTOMY WITH INTRAOPERATIVE CHOLANGIOGRAM;  Surgeon: Jimmye Norman, MD;  Location: [facility] OR;  Service: General;  Laterality: N/A;   COLONOSCOPY  12/26/2011   Procedure: COLONOSCOPY;  Surgeon: Shirley Friar, MD;  Location: [facility] ENDOSCOPY;  Service: Endoscopy;  Laterality: N/A;   COLONOSCOPY     COLONOSCOPY WITH PROPOFOL N/A 07/29/2014   Procedure: COLONOSCOPY WITH PROPOFOL;  Surgeon: Shirley Friar, MD;  Location: [facility] ENDOSCOPY;  Service: Endoscopy;  Laterality: N/A;   CYSTOSCOPY N/A 05/18/2018   Procedure: CYSTOSCOPY REMOVAL FOREIGN BODY;  Surgeon: Ihor Gully, MD;  Location: [facility] ORS;  Service: Urology;  Laterality: N/A;   CYSTOSCOPY/RETROGRADE/URETEROSCOPY Bilateral 04/25/2013   Procedure: CYSTOSCOPY/ BILATERAL RETROGRADES; BLADDER BIOPSIES;  Surgeon: Sebastian Ache, MD;  [facility];  Service: Urology;  Laterality: Bilateral;   DENTAL EXAMINATION UNDER ANESTHESIA     ESOPHAGOGASTRODUODENOSCOPY  12/26/2011   Procedure: ESOPHAGOGASTRODUODENOSCOPY (EGD);  Surgeon: Shirley Friar, MD;  Location: [facility] ENDOSCOPY;  Service: Endoscopy;  Laterality: N/A;   ESOPHAGOGASTRODUODENOSCOPY (EGD) WITH PROPOFOL N/A 07/29/2014   Procedure: ESOPHAGOGASTRODUODENOSCOPY (EGD) WITH PROPOFOL;  Surgeon: Shirley Friar, MD;  Location: [facility] ENDOSCOPY;  Service: Endoscopy;  Laterality: N/A;   EXCISION CHRONIC LEFT BREAST ABSCESS  09-21-2010   EXCISIONAL BX LEFT BREAST MASS/  I  &  D LEFT BREAST ABSCESS  03-24-2009   KNEE ARTHROSCOPY Right 1985   left axilla biopsy  04/2013   left hip biopsy  10/2012    LYMPHADENECTOMY Bilateral 08/18/2013   Procedure: LYMPHADENECTOMY "PELVIC LYMPH NODE DISSECTION";  Surgeon: Sebastian Ache, MD;  Location: [facility] ORS;  Service: Urology;  Laterality: Bilateral;   PROSTATE BIOPSY N/A 04/25/2013   Procedure: BIOPSY TRANSRECTAL ULTRASONIC PROSTATE (TUBP);  Surgeon: Sebastian Ache, MD;  Location[facility];  Service: Urology;  Laterality: N/A;   ROBOT ASSISTED LAPAROSCOPIC RADICAL PROSTATECTOMY N/A 08/18/2013   Procedure: ROBOTIC ASSISTED LAPAROSCOPIC RADICAL PROSTATECTOMY;  Surgeon: Sebastian Ache, MD;  Location: [facility] ORS;  Service: Urology;  Laterality: N/A;   TOTAL KNEE ARTHROPLASTY Right 08/01/2016   08/17/16   TOTAL KNEE ARTHROPLASTY Right 08/01/2016   Procedure: TOTAL KNEE ARTHROPLASTY;  Surgeon: Sheral Apley, MD;  Location: [facility] OR;  Service: Orthopedics;  Laterality: Right;   UPPER GASTROINTESTINAL ENDOSCOPY     UPPER LEG SOFT TISSUE BIOPSY Left 2012   lthigh     Current Outpatient Medications on File Prior to Visit  Medication Sig Dispense Refill   albuterol (PROVENTIL) (2.5 MG/3ML) 0.083% nebulizer solution Take 3 mLs (2.5 mg total) by nebulization every 6 (six) hours as needed for wheezing or shortness of breath. 75 mL 3   albuterol (VENTOLIN HFA) 108 (90 Base) MCG/ACT inhaler Inhale 2 puffs into the lungs every 6 (six) hours as needed for wheezing or shortness of breath. 36 g 3   ALPRAZolam (XANAX) 0.5 MG tablet TAKE 1 TABLET(0.5 MG) BY MOUTH TWICE DAILY AS NEEDED FOR SLEEP 60 tablet 0   BIKTARVY 50-200-25 MG TABS tablet TAKE 1 TABLET BY MOUTH DAILY 30 tablet 11   BREZTRI AEROSPHERE 160-9-4.8 MCG/ACT AERO INHALE 2 PUFFS INTO THE LUNGS IN THE MORNING AND AT BEDTIME 10.7 g 0   cetirizine (ZYRTEC) 10 MG tablet Take 1 tablet (10 mg total) by mouth daily. 90 tablet 3   Cholecalciferol (VITAMIN D3) 250 MCG (10000 UT) capsule Take 1 tablet by mouth daily. [employer] Made     diphenhydrAMINE (BENADRYL) 25 MG tablet Take 1 tablet (25 mg total) by mouth  every 6 (six) hours as needed. (Patient taking differently: Take 25 mg by mouth every 6 (six) hours as needed for sleep, itching or allergies.) 30 tablet 0   dorzolamide (TRUSOPT) 2 % ophthalmic solution 1 drop 2 (two) times daily.     EPINEPHrine 0.3 mg/0.3 mL  IJ SOAJ injection Inject 0.3 mg into the muscle as needed for anaphylaxis. 1 each 5   famotidine (PEPCID) 40 MG tablet Take 1 tablet (40 mg total) by mouth daily. 30 tablet 5   gabapentin (NEURONTIN) 300 MG capsule TAKE 1 CAPSULE(300 MG) BY MOUTH FOUR TIMES DAILY AS NEEDED FOR PAIN 120 capsule 0   Glucos-Chond-Hyal Ac-Ca Fructo (MOVE FREE JOINT HEALTH ADVANCE PO) Take 1 tablet by mouth daily.     hydrALAZINE (APRESOLINE) 50 MG tablet TAKE 1 TABLET(50 MG) BY MOUTH THREE TIMES DAILY 270 tablet 1   HYDROcodone-acetaminophen (NORCO/VICODIN) 5-325 MG tablet Take 1 tablet by mouth every 6 (six) hours as needed.     hydrOXYzine (ATARAX) 50 MG tablet Take 1 tablet (50 mg total) by mouth 2 (two) times daily. 60 tablet 1   meclizine (ANTIVERT) 25 MG tablet Take 1 tablet (25 mg total) by mouth daily as needed. 40 tablet 5   meloxicam (MOBIC) 7.5 MG tablet Take 1 tablet (7.5 mg total) by mouth daily. 21 tablet 1   methocarbamol (ROBAXIN) 500 MG tablet Take 500 mg by mouth 4 (four) times daily.     metoprolol succinate (TOPROL-XL) 100 MG 24 hr tablet TAKE 1 TABLET(100 MG) BY MOUTH DAILY WITH OR IMMEDIATELY FOLLOWING A MEAL 90 tablet 3   montelukast (SINGULAIR) 10 MG tablet Take 10 mg by mouth daily.     Multiple Vitamin (MULTIVITAMIN WITH MINERALS) TABS tablet Take 1 tablet by mouth daily after breakfast.      mupirocin ointment (BACTROBAN) 2 % APPLY EXTERNALLY TO THE AFFECTED AREA TWICE DAILY FOR 3 WEEKS, PLACE COTTON BETWEEN TOES TO ALLOW BETTER AERATION AS NEEDED (Patient taking differently: Apply 1 application  topically See admin instructions. APPLY EXTERNALLY TO THE AFFECTED AREA TWICE DAILY FOR 3 WEEKS, PLACE COTTON BETWEEN TOES TO ALLOW BETTER  AERATION AS NEEDED) 66 g 1   neomycin-polymyxin-hydrocortisone (CORTISPORIN) OTIC solution Place 4 drops into the left ear 4 (four) times daily. 10 mL 0   ondansetron (ZOFRAN-ODT) 4 MG disintegrating tablet Take 1 tablet (4 mg total) by mouth every 8 (eight) hours as needed for nausea or vomiting. 30 tablet 2   OVER THE COUNTER MEDICATION Take 2 capsules by mouth daily. Zhou: thyroid supplement     pantoprazole (PROTONIX) 40 MG tablet Take 1 tablet (40 mg total) by mouth 2 (two) times daily before a meal. 180 tablet 3   rosuvastatin (CRESTOR) 20 MG tablet TAKE 1 TABLET(20 MG) BY MOUTH DAILY 90 tablet 3   tizanidine (Casey Brennan) 6 MG capsule TAKE 1 CAPSULE(6 MG) BY MOUTH THREE TIMES DAILY 90 capsule 11   torsemide (DEMADEX) 20 MG tablet Take 10 mg by mouth See admin instructions. Tuesday Thursday Saturday     Ubrogepant (UBRELVY) 100 MG TABS Take 1 tablet (100 mg total) by mouth 2 (two) times daily as needed (can take a second dose 2 hours after first, do not exceed 2 a day.). 10 tablet 5   nitroGLYCERIN (NITROSTAT) 0.4 MG SL tablet Place 1 tablet (0.4 mg total) under the tongue every 5 (five) minutes as needed for chest pain. 30 tablet 1   [DISCONTINUED] zolpidem (AMBIEN) 5 MG tablet Take 1-2 tablets (5-10 mg total) by mouth at bedtime as needed for sleep. (Patient not taking: Reported on 04/23/2019) 30 tablet 2   No current facility-administered medications on file prior to visit.    Allergies  Allergen Reactions   Amoxapine Shortness Of Breath   Chocolate Shortness Of Breath and Other (See  Comments)    Asthma attack, welts   Ciprofloxacin Anaphylaxis, Dermatitis, Itching, Nausea And Vomiting, Shortness Of Breath, Swelling and Hives   Descovy [Emtricitabine-Tenofovir Af] Shortness Of Breath    Nasuea, vomiiting, diarrhea, sob, whelps, throat closes   Doxycycline Hyclate Shortness Of Breath and Swelling   Genvoya [Elviteg-Cobic-Emtricit-Tenofaf] Shortness Of Breath    Nausea, vomitting,  diarreha, sob, rash, throat closing   Morphine And Codeine Shortness Of Breath, Itching and Other (See Comments)     chest pain   Other Anaphylaxis   Oxycodone-Acetaminophen Shortness Of Breath   Penicillins Shortness Of Breath and Rash    Has patient had a PCN reaction causing immediate rash, facial/tongue/throat swelling, SOB or lightheadedness with hypotension: Yes Has patient had a PCN reaction causing severe rash involving mucus membranes or skin necrosis: No Has patient had a PCN reaction that required hospitalization No Has patient had a PCN reaction occurring within the last 10 years: Yes If all of the above answers are "NO", then may proceed with Cephalosporin use.   Sulfa Antibiotics Anaphylaxis and Other (See Comments)   Tomato Shortness Of Breath   Vancomycin Shortness Of Breath and Other (See Comments)   Darunavir Itching   Amlodipine Other (See Comments)    swelling   Carvedilol     LEG SWELLING, DIZZNESS   Flagyl [Metronidazole] Itching   Milk-Related Compounds    Morphine Other (See Comments)   Oxycodone Hcl Other (See Comments)   Penicillin G Other (See Comments)   Toradol [Ketorolac Tromethamine] Nausea And Vomiting    itching   Acetaminophen Rash and Other (See Comments)    Pt tolerates percocet and also plain APAP without rash   Tramadol Itching, Nausea And Vomiting and Other (See Comments)     DIAGNOSTIC DATA (LABS, IMAGING, TESTING) - I reviewed patient records, labs, notes, testing and imaging myself where available.  Lab Results  Component Value Date   WBC 8.8 10/17/2022   HGB 12.0 (L) 10/17/2022   HCT 36.8 (L) 10/17/2022   MCV 101.3 (H) 10/17/2022   PLT 200.0 10/17/2022      Component Value Date/Time   NA 139 10/17/2022 1555   NA 143 01/25/2022 1611   NA 142 02/24/2013 0848   K 4.2 10/17/2022 1555   K 3.4 (L) 02/24/2013 0848   CL 104 10/17/2022 1555   CO2 30 10/17/2022 1555   CO2 24 02/24/2013 0848   GLUCOSE 91 10/17/2022 1555   GLUCOSE  121 02/24/2013 0848   GLUCOSE 93 03/25/2009 0000   BUN 20 10/17/2022 1555   BUN 8 01/25/2022 1611   BUN 11.9 02/24/2013 0848   CREATININE 1.20 10/17/2022 1555   CREATININE 1.24 11/11/2020 1526   CREATININE 1.3 02/24/2013 0848   CALCIUM 8.7 10/17/2022 1555   CALCIUM 8.9 02/24/2013 0848   PROT 6.7 10/17/2022 1555   PROT 7.0 01/25/2022 1611   PROT 7.1 02/24/2013 0848   ALBUMIN 3.8 10/17/2022 1555   ALBUMIN 4.3 01/25/2022 1611   ALBUMIN 3.6 02/24/2013 0848   AST 22 10/17/2022 1555   AST 14 02/24/2013 0848   ALT 18 10/17/2022 1555   ALT 11 02/24/2013 0848   ALKPHOS 76 10/17/2022 1555   ALKPHOS 94 02/24/2013 0848   BILITOT 1.8 (H) 10/17/2022 1555   BILITOT 0.5 01/25/2022 1611   BILITOT 1.40 (H) 02/24/2013 0848   GFRNONAA >60 02/25/2022 2051   GFRNONAA 70 12/04/2018 1543   GFRAA >60 05/08/2019 0504   GFRAA 82 12/04/2018 1543   Lab Results  Component Value Date   CHOL 139 08/14/2022   HDL 39.10 08/14/2022   LDLCALC 80 08/14/2022   TRIG 101.0 08/14/2022   CHOLHDL 4 08/14/2022   Lab Results  Component Value Date   HGBA1C <4.2 08/15/2022   Lab Results  Component Value Date   VITAMINB12 458 08/14/2022   Lab Results  Component Value Date   TSH 2.51 08/14/2022    PHYSICAL EXAM:  Today's Vitals   11/16/22 1116  BP: (!) 135/91  Pulse: (!) 59  Weight: 205 lb (93 kg)  Height: 5\' 9"  (1.753 m)   Body mass index is 30.27 kg/m.   Wt Readings from Last 3 Encounters:  11/16/22 205 lb (93 kg)  11/10/22 211 lb 5 oz (95.8 kg)  11/08/22 204 lb (92.5 kg)     Ht Readings from Last 3 Encounters:  11/16/22 5\' 9"  (1.753 m)  11/10/22 5\' 9"  (1.753 m)  11/08/22 5\' 9"  (1.753 m)      General: The patient is awake, alert and appears not in acute distress. The patient is well groomed. Head: Normocephalic, atraumatic. Neck is  straightened- loss of lordosis, not supple.Cardiovascular:  Regular rate and cardiac rhythm by pulse,  without distended neck veins. Respiratory: Lungs are  clear to auscultation.  Skin:  Without evidence of ankle edema, or rash. Trunk: The patient's posture is erect.   NEUROLOGIC EXAM: The patient is awake and alert, oriented to place and time.   Memory subjective described as intact.  Attention span & concentration ability appears normal.  Speech is fluent,  with dysphagia , not dysarthria.   Cranial nerves: no loss of smell or taste reported  Pupils are equal and briskly reactive to light. Funduscopic exam deferred. .  Extraocular movements in vertical and horizontal planes were intact and without nystagmus. No Diplopia. Visual fields by finger perimetry are intact. Hearing was intact to soft voice and finger rubbing.    Facial sensation intact to fine touch.  Facial motor strength is symmetric and tongue and uvula move midline.  Neck ROM : rotation, tilt and flexion extension were normal for age and shoulder shrug was symmetrical.    Hand muscles seems wasted.  Ulnar nerve damage.    Deep tendon reflexes: in the  upper and lower extremities are symmetric and intact.  Babinski response was deferred.    ASSESSMENT AND PLAN 62 y.o. year old male  here with: Headaches    He had been diagnosed with viral meningitis in 06-2022. His headaches were much improved after LP and steroids- , but he has a constant neck and occipital pain and radiating to the top of the head via the nape and back  of the scull.    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 I had offered meloxicam , but he reported an NSADS allergy.  I will instead recommend Casey Brennan or Casey Brennan , and apparently CaseyDumanski has already prescribed this medication- 5 mg Casey Brennan.  This is until surgery- bid po.    Plan )I had tried to treat the throbbing HA with Casey Brennan , initially with success- and he took the medication several weeks daily , but  has not much  relief now.  Will use Casey Brennan , which is preventive , too.  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 He  reports  that his fingers turn blue with pain when he touch cold surfaces or in a cold environment.  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   Plan )He should use any vaso-constrictor sparingly- especially decongestants.   Status : HIV positive , CD4 count was 350, CD4 Helper cells are 40.  GI has worked on his swallowing , this has improved.   No orders of the defined types were placed in this encounter.    I plan to follow up either personally or through our NP within 12 months.   I would like to thank Casey Levins, MD and Casey Levins, Md [address] [place],  [place] [address] for allowing me to meet with and to take care of this pleasant patient.    After spending a total time of  40  minutes face to face and additional time for physical and neurologic examination, review of laboratory studies,  personal review of imaging studies, reports and results of other testing and review of referral information / records as far as provided in visit,   Electronically signed by: Casey Novas, MD 11/16/2022 11:59 AM  [facility] Board certified by The [employer] of Sleep Medicine and Diplomate of the [employer] of [employer]. Board certified In Neurology through the ABPN, Fellow of the [employer] of [employer]. Medical Director of [facility].

## 2022-11-16 NOTE — Patient Instructions (Addendum)
He had been diagnosed with viral meningitis in 06-2022. His headaches were much improved after LP and steroids- , but he has a constant neck and occipital pain and radiating to the top of the head via the nape and back  of the scull.    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 I had offered meloxicam , but he reported an NSADS allergy.  I will instead recommend Zanaflex or Flexaril , and apparently Dr.Dumanski has already prescribed this medication- 10 mg flexaril.  This is until surgery- bid po.    Plan )I had tried to treat the throbbing HA with Bernita Raisin , initially with success- and he took the medication several weeks daily , but  has not much relief now.  Will use Qlipta , which is preventive , too.  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 He reports  that his fingers turn blue with pain when he touch cold surfaces or in a cold environment.  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   Plan )He should use any vaso-constrictor sparingly- especially decongestants.   Status : HIV positive , CD4 count was 350, CD4 Helper cells are 40.  GI has worked on his swallowing , this has improved.     Rv in 12 months.

## 2022-11-16 NOTE — Addendum Note (Signed)
Addended by: Melvyn Novas on: 11/16/2022 12:32 PM   Modules accepted: Orders

## 2022-11-17 ENCOUNTER — Ambulatory Visit: Payer: 59 | Admitting: Internal Medicine

## 2022-11-20 ENCOUNTER — Telehealth: Payer: Self-pay | Admitting: Internal Medicine

## 2022-11-20 NOTE — Telephone Encounter (Signed)
Called EGD results below - looks like an eosinophilic syndrome - has recent hypereosinophilia also  Having food sensitivity or allergy issues also  Sees Dr. [place] Callas of [facility]  His dysphagia is better but has some? Early satiety  Advised keep f/u Dr. Rhea Belton 5/22 to determine next steps  He is posted for lumbar surgery 5/30 - the antral ulcer was deep, may be in best interest to postpone surgery (not discussed today)?  EGD 11/08/2022  - Benign-appearing esophageal stenosis. 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 Dilated. 20 mm. - LA Grade A reflux esophagitis with no bleeding. Biopsied. - Non-bleeding gastric ulcers with no stigmata of bleeding. Biopsied. Largest 2 cm and deep - Erosive gastropathy with no stigmata of recent bleeding. Biopsied. - The examination was otherwise normal.  1. Immunohistochemical stains, performed on parts 1 and 2, are negative for Helicobacter pylori organisms. Orene Desanctis DO Pathologist, Electronic Signature ( Signed 11/10/2022) FINAL DIAGNOSIS Diagnosis 1. Surgical [P], gastric antrum - PREDOMINANTLY ANTRAL TYPE MUCOSA WITH FEATURES OF BOTH CHEMICAL/REACTIVE CHANGE AND MILD CHRONIC INACTIVE GASTRITIS. - FOCAL FRAGMENTS OF FIBROPURULENT DEBRIS AND FOCAL AREA OF GRANULATION TISSUE CONSISTENT WITH EROSION/ULCERATION. - AN IMMUNOHISTOCHEMICAL STAIN FOR HELICOBACTER PYLORI ORGANISMS IS PENDING AND WILL BE REPORTED IN AN ADDENDUM. - SEE NOTE NOTE: IN ADDITION TO THE ABOVE FINDINGS THERE ARE ABUNDANT EOSINOPHILS PRESENT. THESE EOSINOPHILS ARE PREDOMINANTLY IN THE LAMINA PROPRIA IN CLUSTERS FOCALLY THEY SCRATCH THAT, FOCALLY THEY INVOLVE MUSCULARIS MUCOSA. GIVEN THE PRESENCE OF EROSION/ULCERATION, THESE EOSINOPHILS MAY SIMPLY BE PART OF A NORMAL RESPONSE; HOWEVER GIVEN THERE INCREASED NUMBER EOSINOPHILIC GASTROENTERITIS IS CONSIDERED. CLINICAL/ENDOSCOPIC CORRELATION NECESSARY. 2. Surgical [P], gastric body gastritis - PREDOMINANTLY OXYNTIC  TYPE MUCOSA WITH FOCAL AREAS OF POSSIBLE ANTRALIZATION AND A RARE GOBLET CELL, SUGGESTIVE OF ATROPHY, IN THE BACKGROUND OF A MODERATE CHRONIC INACTIVE GASTRITIS. - AN IMMUNOHISTOCHEMICAL STAIN FOR HELICOBACTER PYLORI ORGANISMS IS PENDING AND WILL BE REPORTED IN AN ADDENDUM. NOTE: WHILE THERE ARE SOME SCATTERED EOSINOPHILS PRESENT THEY ARE NOT NEAR AS PROMINENT AS THAT SEEN ON THE BIOPSY ABOVE. 3. 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 NOTE: GIVEN THE LOCATION THIS COULD SIMPLY REPRESENT REFLUX; HOWEVER GIVEN THE NUMBER OF EOSINOPHILS, EOSINOPHILIC ESOPHAGITIS CANNOT BE COMPLETELY EXCLUDED CLINICAL/ENDOSCOPIC CORRELATION NECESSARY. Orene Desanctis DO Pathologist, Electronic Signature (Case signed 11/10/2022)

## 2022-11-22 ENCOUNTER — Ambulatory Visit (INDEPENDENT_AMBULATORY_CARE_PROVIDER_SITE_OTHER): Payer: 59 | Admitting: Internal Medicine

## 2022-11-22 ENCOUNTER — Encounter: Payer: 59 | Admitting: Internal Medicine

## 2022-11-22 ENCOUNTER — Encounter: Payer: Self-pay | Admitting: Internal Medicine

## 2022-11-22 ENCOUNTER — Encounter: Payer: Self-pay | Admitting: Cardiology

## 2022-11-22 VITALS — BP 106/68 | HR 100 | Ht 67.25 in | Wt 203.1 lb

## 2022-11-22 DIAGNOSIS — K5281 Eosinophilic gastritis or gastroenteritis: Secondary | ICD-10-CM

## 2022-11-22 DIAGNOSIS — K259 Gastric ulcer, unspecified as acute or chronic, without hemorrhage or perforation: Secondary | ICD-10-CM

## 2022-11-22 DIAGNOSIS — K6289 Other specified diseases of anus and rectum: Secondary | ICD-10-CM

## 2022-11-22 DIAGNOSIS — K9289 Other specified diseases of the digestive system: Secondary | ICD-10-CM

## 2022-11-22 DIAGNOSIS — B20 Human immunodeficiency virus [HIV] disease: Secondary | ICD-10-CM

## 2022-11-22 DIAGNOSIS — R933 Abnormal findings on diagnostic imaging of other parts of digestive tract: Secondary | ICD-10-CM

## 2022-11-22 DIAGNOSIS — K253 Acute gastric ulcer without hemorrhage or perforation: Secondary | ICD-10-CM

## 2022-11-22 DIAGNOSIS — K2100 Gastro-esophageal reflux disease with esophagitis, without bleeding: Secondary | ICD-10-CM | POA: Diagnosis not present

## 2022-11-22 DIAGNOSIS — K200 Eosinophilic esophagitis: Secondary | ICD-10-CM

## 2022-11-22 DIAGNOSIS — K222 Esophageal obstruction: Secondary | ICD-10-CM

## 2022-11-22 MED ORDER — PEG 3350-KCL-NA BICARB-NACL 420 G PO SOLR: 4000.0000 mL | Freq: Once | ORAL | 0 refills | Status: AC

## 2022-11-22 NOTE — Addendum Note (Signed)
Addended by: Bufford Spikes on: 11/22/2022 04:20 PM   Modules accepted: Orders

## 2022-11-22 NOTE — Patient Instructions (Addendum)
Please follow up with [facility] [place] [facility]. _______________________________________________________  Continue pantoprazole twice daily (uninterrupted).  _______________________________________________________   Discontinue your famotidine. _______________________________________________________  Please avoid all NSAID's. Some examples of NSAID's are as follows: Aspirin (Bufferin, Bayer, and Excedrin) Ibuprofen (Advil, Motrin, Nuprin) Ketoprofen (Actron, Orudis) Naproxen (Aleve) Daypro  Indocin  Lodine  Naprosyn  Relafen  Vimovo Voltaren ___________________________________________________________  You have been scheduled for an endoscopy and colonoscopy. Please follow the written instructions given to you at your visit today. Please pick up your prep supplies at the pharmacy within the next 1-3 days. If you use inhalers (even only as needed), please bring them with you on the day of your procedure. __________________________________________________________ If your blood pressure at your visit was 140/90 or greater, please contact your primary care physician to follow up on this.  _______________________________________________________  If you are age 63 or older, your body mass index should be between 23-30. Your Body mass index is 31.58 kg/m. If this is out of the aforementioned range listed, please consider follow up with your Primary Care Provider.  If you are age 62 or younger, your body mass index should be between 19-25. Your Body mass index is 31.58 kg/m. If this is out of the aformentioned range listed, please consider follow up with your Primary Care Provider.   ________________________________________________________  The [facility] providers would like to encourage you to use [facility] to communicate with providers for non-urgent requests or questions.  Due to long hold times on the telephone, sending your provider a message by [facility] may be a  faster and more efficient way to get a response.  Please allow 48 business hours for a response.  Please remember that this is for non-urgent requests.  _______________________________________________________  Due to recent changes in healthcare laws, you may see the results of your imaging and laboratory studies on MyChart before your provider has had a chance to review them.  We understand that in some cases there may be results that are confusing or concerning to you. Not all laboratory results come back in the same time frame and the provider may be waiting for multiple results in order to interpret others.  Please give [place] 48 hours in order for your provider to thoroughly review all the results before contacting the office for clarification of your results.

## 2022-11-22 NOTE — Progress Notes (Signed)
Subjective:    Patient ID: Casey Brennan, male    DOB: [birth year], 62 y.o.   MRN: [id number]  HPI Casey Brennan is a 62 year old male with longstanding HIV disease on antiviral therapy, history of dysphagia with dilation in 2020, recently discovered large gastric ulcer and eosinophilic esophagitis and gastritis who is here for follow-up.  He also has a history of COPD, prostate cancer status post prostatectomy, cervical disc disease and arthritis.  He is here today with his sister.  Dr. Leone Payor was able to accommodate him for upper endoscopy to evaluate recurrent dysphagia but also upper abdominal discomfort.  This was performed recently on 11/08/2022.  He found esophageal stenosis at the GE junction dilated to 20 mm.  LA grade a esophagitis which was biopsied.  And several cratered the largest 2 cm gastric ulcers which were biopsied.  There were also erosions in the gastric body. Pathology revealed negative H. pylori.  In the esophagus there was increased eosinophils too numerous to count.  In the gastric body there were abundant eosinophils but less then that in the antrum.  The patient had not been adherent with PPI and had been on Aleve.  He has since stopped Aleve and has been taking pantoprazole 40 mg twice daily.  He is having some issues with swallowing though it has improved.  He has some upper abdominal pressure and early fullness symptom.  He has not had vomiting.  He has not had melena or blood in stool.  He is changing from [place] to [place] per Dr. Ninetta Lights.  He is planning to have a cervical fusion on 11/30/2022 and then a shoulder surgery in September.  He also had a CT scan on 10/09/2022.  This was to follow-up of previously abnormal PET scan which showed lytic bone lesions and lymphadenopathy.  From a GI perspective there was recurrent rectal wall thickening favoring proctitis.  Adenopathy had resolved in the left iliac lytic lesion was unchanged.  Review of Systems As per  HPI, otherwise negative  Current Medications, Allergies, Past Medical History, Past Surgical History, Family History and Social History were reviewed in [employer] record.    Objective:   Physical Exam BP 106/68 (BP Location: Left Arm, Patient Position: Sitting, Cuff Size: Normal)   Pulse 100   Ht 5' 7.25" (1.708 m) Comment: height measured without shoes  Wt 203 lb 2 oz (92.1 kg)   BMI 31.58 kg/m  Gen: awake, alert, NAD HEENT: anicteric  Abd: soft, NT/ND, +BS throughout Ext: no c/c/e Neuro: nonfocal  CT CHEST, ABDOMEN, AND PELVIS WITH CONTRAST   TECHNIQUE: Multidetector CT imaging of the chest, abdomen and pelvis was performed following the standard protocol during bolus administration of intravenous contrast.   RADIATION DOSE REDUCTION: This exam was performed according to the departmental dose-optimization program which includes automated exposure control, adjustment of the mA and/or kV according to patient size and/or use of iterative reconstruction technique.   CONTRAST:  ISOVUE-300 IOPAMIDOL (ISOVUE-300) INJECTION 61%   COMPARISON:  CTA chest 02/26/2022. 02/07/2021 abdominopelvic CT from [facility]. The only comparison PET is 03/31/2013.   FINDINGS: CT CHEST FINDINGS   Cardiovascular: Normal caliber of the aorta and branch vessels. Normal heart size, without pericardial effusion. No central pulmonary embolism, on this non-dedicated study.   Mediastinum/Nodes: No supraclavicular adenopathy. Multiple small bilateral axillary and subpectoral nodes are present on the prior, likely reactive especially given maintenance of fatty hila. No mediastinal or hilar adenopathy.   Lungs/Pleura: No pleural  fluid. Minimal motion degradation in the mid chest.   3 mm right upper lobe pulmonary nodule on 52/6, similar to 02/26/2022 and considered benign.   Mild posterior right upper lobe cystic bronchiectasis including on 55/6 is similar to  the prior and likely due to remote infection or inflammation. Mild centrilobular emphysema.   Musculoskeletal: No acute osseous abnormality.   CT ABDOMEN PELVIS FINDINGS   Hepatobiliary: Normal liver. Cholecystectomy, without biliary ductal dilatation.   Pancreas: Normal, without mass or ductal dilatation.   Spleen: Normal in size, mild degradation secondary to arm position, not raised above the head. There may be subcentimeter low-density splenic lesions which are of doubtful clinical significance. Example 6 mm on 60/4. No splenomegaly.   Adrenals/Urinary Tract: Normal left adrenal gland. Right adrenal nodule measures 1.4 cm and 75 HU on 57/4. This is similar in size back to 06/20/2021, favoring a benign etiology.   Bilateral renal fluid density lesions consistent with cysts. Areas of bilateral renal cortical thinning.   An interpolar right renal 2.4 cm lesion on 65/4 measures greater than fluid density. This lesion was similar in size on 06/20/2021 and was fluid density on that exam. No hydronephrosis. Normal urinary bladder.   Stomach/Bowel: Proximal gastric underdistention. The rectum is mild to moderately thick walled, similar to slightly increased from 06/20/2021. Example 107/4. No colonic obstruction. Normal terminal ileum. Normal small bowel.   Vascular/Lymphatic: Normal caliber of the aorta and branch vessels. No abdominopelvic adenopathy.   Reproductive: Prostatectomy.  No local recurrence.   Other: No significant free fluid. No evidence of omental or peritoneal disease. Small fat containing paraumbilical hernias x2.   Musculoskeletal: Subcentimeter left iliac lucent lesions x2 are similar to 06/20/2021. Degenerative disc disease at L3-4 with disc bulge at L4-5.   IMPRESSION: 1. 2 subcentimeter left iliac lytic lesions, 1 unchanged since 03/31/2013 and the other unchanged since 06/20/2021. This favors a nonaggressive etiology. No other focal osseous lesion  identified. 2. No adenopathy within the chest abdomen or pelvis. 3. Recurrent or residual rectal wall thickening since 06/20/2021. Favor proctitis. Correlate with colon cancer screening history and if not up-to-date, consider colonoscopy. 4. Interpolar right renal lesion measures greater than fluid density but is similar in size to the prior exam, where it was simple appearing. This favors a hemorrhagic/proteinaceous cyst. 5. Prostatectomy, without evidence of metastatic disease. 6. Right adrenal nodule which was characterized as an adenoma back on 02/28/2013 . In the absence of clinically indicated signs/symptoms require(s) no independent follow-up. 7.  Emphysema (ICD10-J43.9).     Electronically Signed   By: Jeronimo Greaves M.D.   On: 10/10/2022 11:54     Latest Ref Rng & Units 10/17/2022    3:55 PM 08/14/2022    3:40 PM 02/25/2022    8:51 PM  CBC  WBC 4.0 - 10.5 K/uL 8.8  6.4  5.4   Hemoglobin 13.0 - 17.0 g/dL 16.1  09.6  04.5   Hematocrit 39.0 - 52.0 % 36.8  39.7  36.9   Platelets 150.0 - 400.0 K/uL 200.0  216.0  184    CMP     Component Value Date/Time   NA 139 10/17/2022 1555   NA 143 01/25/2022 1611   NA 142 02/24/2013 0848   K 4.2 10/17/2022 1555   K 3.4 (L) 02/24/2013 0848   CL 104 10/17/2022 1555   CO2 30 10/17/2022 1555   CO2 24 02/24/2013 0848   GLUCOSE 91 10/17/2022 1555   GLUCOSE 121 02/24/2013 0848   GLUCOSE  93 03/25/2009 0000   BUN 20 10/17/2022 1555   BUN 8 01/25/2022 1611   BUN 11.9 02/24/2013 0848   CREATININE 1.20 10/17/2022 1555   CREATININE 1.24 11/11/2020 1526   CREATININE 1.3 02/24/2013 0848   CALCIUM 8.7 10/17/2022 1555   CALCIUM 8.9 02/24/2013 0848   PROT 6.7 10/17/2022 1555   PROT 7.0 01/25/2022 1611   PROT 7.1 02/24/2013 0848   ALBUMIN 3.8 10/17/2022 1555   ALBUMIN 4.3 01/25/2022 1611   ALBUMIN 3.6 02/24/2013 0848   AST 22 10/17/2022 1555   AST 14 02/24/2013 0848   ALT 18 10/17/2022 1555   ALT 11 02/24/2013 0848   ALKPHOS 76  10/17/2022 1555   ALKPHOS 94 02/24/2013 0848   BILITOT 1.8 (H) 10/17/2022 1555   BILITOT 0.5 01/25/2022 1611   BILITOT 1.40 (H) 02/24/2013 0848   GFRNONAA >60 02/25/2022 2051   GFRNONAA 70 12/04/2018 1543   GFRAA >60 05/08/2019 0504   GFRAA 82 12/04/2018 1543       Assessment & Plan:  62 year old male with longstanding HIV disease on antiviral therapy, history of dysphagia with dilation in 2020, recently discovered large gastric ulcer and eosinophilic esophagitis and gastritis who is here for follow-up.  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  The duodenum was not biopsied.  It is possible that this will warrant steroid therapy or even consideration of Dupixent.  Right now we are treating ulcer, see #2.  Follow-up EGD planned with repeat biopsies.  He should follow-up with Dr. [place] Callas his allergy and immunology doctor for his opinion on this condition -- Repeat EGD in July; plan biopsies of esophagus, stomach and duodenum -- If ulcer is healed could consider prednisone versus Dupixent  2.  Gastric ulcer --likely Naprosyn induced.  It is less likely that eosinophilic gastritis causes such deep ulcers.  H. pylori immunohistochemical stains negative -- No aspirin or NSAIDs -- Pantoprazole 40 mg twice daily AC -- EGD July of this year for surveillance  3.  Rectal thickening/prior rectal ulceration --seen in 2020 at colonoscopy but now more recently by CT scan..  More prolapse change at that time by biopsy.  Repeat colonoscopy recommended.  We reviewed the risk, benefits and alternatives and he is agreeable and wishes to proceed.  At present plan biopsies to ensure no eosinophilic involvement in the rectum.  This would be less common with EGID.  4.  HIV --following with Dr. Ninetta Lights.  5.  Upcoming cervical disc fusion --I asked that he make his surgeon aware  that he is under treatment for gastric ulcer which has been nonbleeding at this point.  He should strictly avoid NSAIDs.  If a blood thinner is necessary he may wish to delay surgery but will defer this to the patient's surgeon.  6.  GERD with esophagitis --twice daily PPI.  May also have EoE.  Will follow-up at upper endoscopy as above.  Possible Dupixent candidate.  40 minutes total spent today including patient facing time, coordination of care, reviewing medical history/procedures/pertinent radiology studies, and documentation of the encounter.

## 2022-11-22 NOTE — Pre-Procedure Instructions (Signed)
Surgical Instructions    Your procedure is scheduled on Nov 30, 2022.  Report to [facility] Main Entrance "A" at 7:30 A.M., then check in with the Admitting office.  Call this number if you have problems the morning of surgery:  [phone]   If you have any questions prior to your surgery date call [phone]: Open Monday-Friday 8am-4pm If you experience any cold or flu symptoms such as cough, fever, chills, shortness of breath, etc. between now and your scheduled surgery, please notify us at the above number     Remember:  Do not eat after midnight the night before your surgery  You may drink clear liquids until 7:30AM the morning of your surgery.   Clear liquids allowed are: Water, Non-Citrus Juices (without pulp), Carbonated Beverages, Clear Tea, Black Coffee ONLY (NO MILK, CREAM OR POWDERED CREAMER of any kind), and Gatorade  Patient Instructions  The night before surgery:  No food after midnight. ONLY clear liquids after midnight  The day of surgery (if you do NOT have diabetes):  Drink ONE (1) Pre-Surgery Clear Ensure by 7:30AM the morning of surgery. Drink in one sitting. Do not sip.  This drink was given to you during your hospital  pre-op appointment visit.  Nothing else to drink after completing the  Pre-Surgery Clear Ensure.         If you have questions, please contact your surgeon's office.     Take these medicines the morning of surgery with A SIP OF WATER:  BIKTARVY  BREZTRI AEROSPHERE 160-9-4.8 MCG/ACT AERO  cetirizine (ZYRTEC)  dorzolamide (TRUSOPT) 2 % ophthalmic solution  famotidine (PEPCID) hydrALAZINE (APRESOLINE)  hydrOXYzine (ATARAX)  methocarbamol (ROBAXIN)  metoprolol succinate (TOPROL-XL)  montelukast (SINGULAIR)  pantoprazole (PROTONIX)  QULIPTA  rosuvastatin (CRESTOR)   If needed: albuterol (PROVENTIL) (2.5 MG/3ML) 0.083% nebulizer solution-  albuterol (VENTOLIN HFA) 108 (90 Base) MCG/ACT inhaler -Please bring all inhalers with you  the day of surgery.  diphenhydrAMINE (BENADRYL)  gabapentin (NEURONTIN)  HYDROcodone-acetaminophen (NORCO/VICODIN)  meclizine (ANTIVERT)  ondansetron (ZOFRAN-ODT)  QNASL 80 MCG/ACT AERS     As of today, STOP taking any Aspirin (unless otherwise instructed by your surgeon) Aleve, Naproxen, Ibuprofen, Motrin, Advil, Goody's, BC's, all herbal medications, fish oil, and all vitamins.            [facility] is not responsible for any belongings or valuables.    Do NOT Smoke (Tobacco/Vaping)  24 hours prior to your procedure  If you use a CPAP at night, you may bring your mask for your overnight stay.   Contacts, glasses, hearing aids, dentures or partials may not be worn into surgery, please bring cases for these belongings   For patients admitted to the hospital, discharge time will be determined by your treatment team.   Patients discharged the day of surgery will not be allowed to drive home, and someone needs to stay with them for 24 hours.   SURGICAL WAITING ROOM VISITATION Patients having surgery or a procedure may have no more than 2 support people in the waiting area - these visitors may rotate.   Children under the age of 37 must have an adult with them who is not the patient. If the patient needs to stay at the hospital during part of their recovery, the visitor guidelines for inpatient rooms apply. Pre-op nurse will coordinate an appropriate time for 1 support person to accompany patient in pre-op.  This support person may not rotate.   Please refer to [email or web address] for the  visitor guidelines for Inpatients (after your surgery is over and you are in a regular room).    Special instructions:    Oral Hygiene is also important to reduce your risk of infection.  Remember - BRUSH YOUR TEETH THE MORNING OF SURGERY WITH YOUR REGULAR TOOTHPASTE     Pre-operative 5 CHG Bath Instructions   You can play a key role in  reducing the risk of infection after surgery. Your skin needs to be as free of germs as possible. You can reduce the number of germs on your skin by washing with CHG (chlorhexidine gluconate) soap before surgery. CHG is an antiseptic soap that kills germs and continues to kill germs even after washing.   DO NOT use if you have an allergy to chlorhexidine/CHG or antibacterial soaps. If your skin becomes reddened or irritated, stop using the CHG and notify one of our RNs at [phone].   Please shower with the CHG soap starting 4 days before surgery using the following schedule:     Please keep in mind the following:  DO NOT shave, including legs and underarms, starting the day of your first shower.   You may shave your face at any point before/day of surgery.  Place clean sheets on your bed the day you start using CHG soap. Use a clean washcloth (not used since being washed) for each shower. DO NOT sleep with pets once you start using the CHG.   CHG Shower Instructions:  If you choose to wash your hair and private area, wash first with your normal shampoo/soap.  After you use shampoo/soap, rinse your hair and body thoroughly to remove shampoo/soap residue.  Turn the water OFF and apply about 3 tablespoons (45 ml) of CHG soap to a CLEAN washcloth.  Apply CHG soap ONLY FROM YOUR NECK DOWN TO YOUR TOES (washing for 3-5 minutes)  DO NOT use CHG soap on face, private areas, open wounds, or sores.  Pay special attention to the area where your surgery is being performed.  If you are having back surgery, having someone wash your back for you may be helpful. Wait 2 minutes after CHG soap is applied, then you may rinse off the CHG soap.  Pat dry with a clean towel  Put on clean clothes/pajamas   If you choose to wear lotion, please use ONLY the CHG-compatible lotions on the back of this paper.     Additional instructions for the day of surgery: DO NOT APPLY any lotions, deodorants, cologne, or  perfumes.   Put on clean/comfortable clothes.  Brush your teeth.  Ask your nurse before applying any prescription medications to the skin. Do not wear jewelry or makeup. Men may shave face Do not bring valuables to the hospital. Do not wear nail polish, gel polish, artificial nails, or any other type of covering on natural nails (fingers and toes) If you have artificial nails or gel coating that need to be removed by a nail salon, please have this removed prior to surgery. Artificial nails or gel coating may interfere with anesthesia's ability to adequately monitor your vital signs.     CHG Compatible Lotions   Aveeno Moisturizing lotion  Cetaphil Moisturizing Cream  Cetaphil Moisturizing Lotion  Clairol Herbal Essence Moisturizing Lotion, Dry Skin  Clairol Herbal Essence Moisturizing Lotion, Extra Dry Skin  Clairol Herbal Essence Moisturizing Lotion, Normal Skin  Curel Age Defying Therapeutic Moisturizing Lotion with Alpha Hydroxy  Curel Extreme Care Body Lotion  Curel Soothing Hands Moisturizing Hand Lotion  Curel Therapeutic Moisturizing Cream, Fragrance-Free  Curel Therapeutic Moisturizing Lotion, Fragrance-Free  Curel Therapeutic Moisturizing Lotion, Original Formula  Eucerin Daily Replenishing Lotion  Eucerin Dry Skin Therapy Plus Alpha Hydroxy Crme  Eucerin Dry Skin Therapy Plus Alpha Hydroxy Lotion  Eucerin Original Crme  Eucerin Original Lotion  Eucerin Plus Crme Eucerin Plus Lotion  Eucerin TriLipid Replenishing Lotion  Keri Anti-Bacterial Hand Lotion  Keri Deep Conditioning Original Lotion Dry Skin Formula Softly Scented  Keri Deep Conditioning Original Lotion, Fragrance Free Sensitive Skin Formula  Keri Lotion Fast Absorbing Fragrance Free Sensitive Skin Formula  Keri Lotion Fast Absorbing Softly Scented Dry Skin Formula  Keri Original Lotion  Keri Skin Renewal Lotion Keri Silky Smooth Lotion  Keri Silky Smooth Sensitive Skin Lotion  Nivea Body Creamy  Conditioning Oil  Nivea Body Extra Enriched Lotion  Nivea Body Original Lotion  Nivea Body Sheer Moisturizing Lotion Nivea Crme  Nivea Skin Firming Lotion  NutraDerm 30 Skin Lotion  NutraDerm Skin Lotion  NutraDerm Therapeutic Skin Cream  NutraDerm Therapeutic Skin Lotion  ProShield Protective Hand Cream  Provon moisturizing lotion       If you received a COVID test during your pre-op visit, it is requested that you wear a mask when out in public, stay away from anyone that may not be feeling well, and notify your surgeon if you develop symptoms. If you have been in contact with anyone that has tested positive in the last 10 days, please notify your surgeon.    Please read over the following fact sheets that you were given.

## 2022-11-22 NOTE — Addendum Note (Signed)
Addended by: Bufford Spikes on: 11/22/2022 04:16 PM   Modules accepted: Orders

## 2022-11-22 NOTE — Addendum Note (Signed)
Addended by: Bufford Spikes on: 11/22/2022 04:18 PM   Modules accepted: Orders

## 2022-11-23 ENCOUNTER — Encounter (HOSPITAL_COMMUNITY)
Admission: RE | Admit: 2022-11-23 | Discharge: 2022-11-23 | Disposition: A | Discharge status: Home / Self Care | Payer: 59 | Source: Ambulatory Visit | Attending: Orthopedic Surgery | Admitting: Orthopedic Surgery

## 2022-11-23 ENCOUNTER — Other Ambulatory Visit: Payer: Self-pay

## 2022-11-23 ENCOUNTER — Other Ambulatory Visit: Payer: 59

## 2022-11-23 ENCOUNTER — Encounter (HOSPITAL_COMMUNITY): Payer: Self-pay

## 2022-11-23 VITALS — BP 117/86 | HR 79 | Temp 98.2°F | Resp 18 | Ht 67.0 in | Wt 204.4 lb

## 2022-11-23 DIAGNOSIS — Z21 Asymptomatic human immunodeficiency virus [HIV] infection status: Secondary | ICD-10-CM | POA: Diagnosis not present

## 2022-11-23 DIAGNOSIS — M4802 Spinal stenosis, cervical region: Secondary | ICD-10-CM | POA: Diagnosis not present

## 2022-11-23 DIAGNOSIS — Z8661 Personal history of infections of the central nervous system: Secondary | ICD-10-CM | POA: Insufficient documentation

## 2022-11-23 DIAGNOSIS — Z01812 Encounter for preprocedural laboratory examination: Secondary | ICD-10-CM | POA: Diagnosis not present

## 2022-11-23 DIAGNOSIS — Z79899 Other long term (current) drug therapy: Secondary | ICD-10-CM | POA: Diagnosis not present

## 2022-11-23 DIAGNOSIS — K222 Esophageal obstruction: Secondary | ICD-10-CM | POA: Diagnosis not present

## 2022-11-23 DIAGNOSIS — Z9049 Acquired absence of other specified parts of digestive tract: Secondary | ICD-10-CM | POA: Insufficient documentation

## 2022-11-23 DIAGNOSIS — B20 Human immunodeficiency virus [HIV] disease: Secondary | ICD-10-CM

## 2022-11-23 DIAGNOSIS — N182 Chronic kidney disease, stage 2 (mild): Secondary | ICD-10-CM | POA: Diagnosis not present

## 2022-11-23 DIAGNOSIS — I43 Cardiomyopathy in diseases classified elsewhere: Secondary | ICD-10-CM | POA: Insufficient documentation

## 2022-11-23 DIAGNOSIS — K2100 Gastro-esophageal reflux disease with esophagitis, without bleeding: Secondary | ICD-10-CM | POA: Insufficient documentation

## 2022-11-23 DIAGNOSIS — Z96651 Presence of right artificial knee joint: Secondary | ICD-10-CM | POA: Insufficient documentation

## 2022-11-23 DIAGNOSIS — I129 Hypertensive chronic kidney disease with stage 1 through stage 4 chronic kidney disease, or unspecified chronic kidney disease: Secondary | ICD-10-CM | POA: Diagnosis not present

## 2022-11-23 DIAGNOSIS — K3189 Other diseases of stomach and duodenum: Secondary | ICD-10-CM | POA: Diagnosis not present

## 2022-11-23 DIAGNOSIS — Z87891 Personal history of nicotine dependence: Secondary | ICD-10-CM | POA: Insufficient documentation

## 2022-11-23 DIAGNOSIS — Z8546 Personal history of malignant neoplasm of prostate: Secondary | ICD-10-CM | POA: Insufficient documentation

## 2022-11-23 DIAGNOSIS — Z9079 Acquired absence of other genital organ(s): Secondary | ICD-10-CM | POA: Insufficient documentation

## 2022-11-23 DIAGNOSIS — M4803 Spinal stenosis, cervicothoracic region: Secondary | ICD-10-CM | POA: Diagnosis not present

## 2022-11-23 DIAGNOSIS — Z862 Personal history of diseases of the blood and blood-forming organs and certain disorders involving the immune mechanism: Secondary | ICD-10-CM | POA: Diagnosis not present

## 2022-11-23 DIAGNOSIS — K259 Gastric ulcer, unspecified as acute or chronic, without hemorrhage or perforation: Secondary | ICD-10-CM | POA: Diagnosis not present

## 2022-11-23 DIAGNOSIS — Z01818 Encounter for other preprocedural examination: Secondary | ICD-10-CM

## 2022-11-23 DIAGNOSIS — I119 Hypertensive heart disease without heart failure: Secondary | ICD-10-CM

## 2022-11-23 DIAGNOSIS — J439 Emphysema, unspecified: Secondary | ICD-10-CM | POA: Insufficient documentation

## 2022-11-23 HISTORY — DX: Chronic obstructive pulmonary disease, unspecified: J44.9

## 2022-11-23 HISTORY — DX: Viral meningitis, unspecified: A87.9

## 2022-11-23 HISTORY — DX: Hypertensive heart disease without heart failure: I43

## 2022-11-23 HISTORY — DX: Unspecified glaucoma: H40.9

## 2022-11-23 HISTORY — DX: Chronic kidney disease, unspecified: N18.9

## 2022-11-23 HISTORY — DX: Dyspnea, unspecified: R06.00

## 2022-11-23 HISTORY — DX: Hypertensive heart disease without heart failure: I11.9

## 2022-11-23 HISTORY — DX: Cardiomyopathy in diseases classified elsewhere: I43

## 2022-11-23 LAB — BASIC METABOLIC PANEL
Anion gap: 7 (ref 5–15)
BUN: 12 mg/dL (ref 6–20)
CO2: 26 mmol/L (ref 22–32)
Calcium: 8.8 mg/dL — ABNORMAL LOW (ref 8.9–10.3)
Chloride: 103 mmol/L (ref 98–111)
Creatinine, Ser: 1.36 mg/dL — ABNORMAL HIGH (ref 0.61–1.24)
GFR, Estimated: 60 mL/min — ABNORMAL LOW (ref >60)
Glucose, Bld: 97 mg/dL (ref 70–99)
Potassium: 4.7 mmol/L (ref 3.5–5.1)
Sodium: 136 mmol/L (ref 135–145)

## 2022-11-23 LAB — TYPE AND SCREEN
ABO/RH(D): B NEG
Antibody Screen: NEGATIVE

## 2022-11-23 LAB — CBC
HCT: 40.7 % (ref 39.0–52.0)
Hemoglobin: 13.7 g/dL (ref 13.0–17.0)
MCH: 30.9 pg (ref 26.0–34.0)
MCHC: 33.7 g/dL (ref 30.0–36.0)
MCV: 91.9 fL (ref 80.0–100.0)
Platelets: 202 10*3/uL (ref 150–400)
RBC: 4.43 MIL/uL (ref 4.22–5.81)
RDW: 13.5 % (ref 11.5–15.5)
WBC: 6 10*3/uL (ref 4.0–10.5)
nRBC: 0 % (ref 0.0–0.2)

## 2022-11-23 LAB — SURGICAL PCR SCREEN
MRSA, PCR: NEGATIVE
Staphylococcus aureus: NEGATIVE

## 2022-11-23 NOTE — Pre-Procedure Instructions (Signed)
Surgical Instructions    Your procedure is scheduled on Nov 30, 2022.  Report to [facility] Main Entrance "A" at 7:30 A.M., then check in with the Admitting office.  Call this number if you have problems the morning of surgery:  [phone]   If you have any questions prior to your surgery date call [phone]: Open Monday-Friday 8am-4pm If you experience any cold or flu symptoms such as cough, fever, chills, shortness of breath, etc. between now and your scheduled surgery, please notify us at the above number     Remember:  Do not eat after midnight the night before your surgery  You may drink clear liquids until 7:30AM the morning of your surgery.   Clear liquids allowed are: Water, Non-Citrus Juices (without pulp), Carbonated Beverages, Clear Tea, Black Coffee ONLY (NO MILK, CREAM OR POWDERED CREAMER of any kind), and Gatorade  Patient Instructions  The night before surgery:  No food after midnight. ONLY clear liquids after midnight  The day of surgery (if you do NOT have diabetes):  Drink ONE (1) Pre-Surgery Clear Ensure by 7:30AM the morning of surgery. Drink in one sitting. Do not sip.  This drink was given to you during your hospital  pre-op appointment visit.  Nothing else to drink after completing the  Pre-Surgery Clear Ensure.         If you have questions, please contact your surgeon's office.     Take these medicines the morning of surgery with A SIP OF WATER:  BIKTARVY  BREZTRI AEROSPHERE 160-9-4.8 MCG/ACT AERO  cetirizine (ZYRTEC)  dorzolamide (TRUSOPT) 2 % ophthalmic solution  famotidine (PEPCID) hydrALAZINE (APRESOLINE)  hydrOXYzine (ATARAX)  methocarbamol (ROBAXIN)  metoprolol succinate (TOPROL-XL)  montelukast (SINGULAIR)  pantoprazole (PROTONIX)  QULIPTA  rosuvastatin (CRESTOR)  tizanidine (ZANAFLEX)   If needed: albuterol (PROVENTIL) (2.5 MG/3ML) 0.083% nebulizer solution-  albuterol (VENTOLIN HFA) 108 (90 Base) MCG/ACT inhaler -Please bring  all inhalers with you the day of surgery.  diphenhydrAMINE (BENADRYL)  gabapentin (NEURONTIN)  HYDROcodone-acetaminophen (NORCO/VICODIN)  meclizine (ANTIVERT)  ondansetron (ZOFRAN-ODT)  QNASL 80 MCG/ACT AERS     As of today, STOP taking any Aspirin (unless otherwise instructed by your surgeon) Aleve, Naproxen, Ibuprofen, Motrin, Advil, Goody's, BC's, all herbal medications, fish oil, and all vitamins.  Special instructions:    Oral Hygiene is also important to reduce your risk of infection.  Remember - BRUSH YOUR TEETH THE MORNING OF SURGERY WITH YOUR REGULAR TOOTHPASTE     Pre-operative 5 CHG Bath Instructions   You can play a key role in reducing the risk of infection after surgery. Your skin needs to be as free of germs as possible. You can reduce the number of germs on your skin by washing with CHG (chlorhexidine gluconate) soap before surgery. CHG is an antiseptic soap that kills germs and continues to kill germs even after washing.   DO NOT use if you have an allergy to chlorhexidine/CHG or antibacterial soaps. If your skin becomes reddened or irritated, stop using the CHG and notify one of our RNs at [phone].   Please shower with the CHG soap starting 4 days before surgery using the following schedule:     Please keep in mind the following:  DO NOT shave, including legs and underarms, starting the day of your first shower.   You may shave your face at any point before/day of surgery.  Place clean sheets on your bed the day you start using CHG soap. Use a clean washcloth (not used since  being washed) for each shower. DO NOT sleep with pets once you start using the CHG.   CHG Shower Instructions:  If you choose to wash your hair and private area, wash first with your normal shampoo/soap.  After you use shampoo/soap, rinse your hair and body thoroughly to remove shampoo/soap residue.  Turn the water OFF and apply about 3 tablespoons (45 ml) of CHG soap to a CLEAN  washcloth.  Apply CHG soap ONLY FROM YOUR NECK DOWN TO YOUR TOES (washing for 3-5 minutes)  DO NOT use CHG soap on face, private areas, open wounds, or sores.  Pay special attention to the area where your surgery is being performed.  If you are having back surgery, having someone wash your back for you may be helpful. Wait 2 minutes after CHG soap is applied, then you may rinse off the CHG soap.  Pat dry with a clean towel  Put on clean clothes/pajamas   If you choose to wear lotion, please use ONLY the CHG-compatible lotions on the back of this paper.     Additional instructions for the day of surgery: DO NOT APPLY any lotions, deodorants, cologne, or perfumes.   Put on clean/comfortable clothes.  Brush your teeth.  Ask your nurse before applying any prescription medications to the skin. Do not wear jewelry or makeup. Men may shave face Do not bring valuables to the hospital. Do not wear nail polish, gel polish, artificial nails, or any other type of covering on natural nails (fingers and toes) If you have artificial nails or gel coating that need to be removed by a nail salon, please have this removed prior to surgery. Artificial nails or gel coating may interfere with anesthesia's ability to adequately monitor your vital signs.     CHG Compatible Lotions   Aveeno Moisturizing lotion  Cetaphil Moisturizing Cream  Cetaphil Moisturizing Lotion  Clairol Herbal Essence Moisturizing Lotion, Dry Skin  Clairol Herbal Essence Moisturizing Lotion, Extra Dry Skin  Clairol Herbal Essence Moisturizing Lotion, Normal Skin  Curel Age Defying Therapeutic Moisturizing Lotion with Alpha Hydroxy  Curel Extreme Care Body Lotion  Curel Soothing Hands Moisturizing Hand Lotion  Curel Therapeutic Moisturizing Cream, Fragrance-Free  Curel Therapeutic Moisturizing Lotion, Fragrance-Free  Curel Therapeutic Moisturizing Lotion, Original Formula  Eucerin Daily Replenishing Lotion  Eucerin Dry Skin  Therapy Plus Alpha Hydroxy Crme  Eucerin Dry Skin Therapy Plus Alpha Hydroxy Lotion  Eucerin Original Crme  Eucerin Original Lotion  Eucerin Plus Crme Eucerin Plus Lotion  Eucerin TriLipid Replenishing Lotion  Keri Anti-Bacterial Hand Lotion  Keri Deep Conditioning Original Lotion Dry Skin Formula Softly Scented  Keri Deep Conditioning Original Lotion, Fragrance Free Sensitive Skin Formula  Keri Lotion Fast Absorbing Fragrance Free Sensitive Skin Formula  Keri Lotion Fast Absorbing Softly Scented Dry Skin Formula  Keri Original Lotion  Keri Skin Renewal Lotion Keri Silky Smooth Lotion  Keri Silky Smooth Sensitive Skin Lotion  Nivea Body Creamy Conditioning Oil  Nivea Body Extra Enriched Lotion  Nivea Body Original Lotion  Nivea Body Sheer Moisturizing Lotion Nivea Crme  Nivea Skin Firming Lotion  NutraDerm 30 Skin Lotion  NutraDerm Skin Lotion  NutraDerm Therapeutic Skin Cream  NutraDerm Therapeutic Skin Lotion  ProShield Protective Hand Cream  Provon moisturizing lotion   [facility] is not responsible for any belongings or valuables.    Do NOT Smoke (Tobacco/Vaping)  24 hours prior to your procedure  If you use a CPAP at night, you may bring your mask for your overnight stay.  Contacts, glasses, hearing aids, dentures or partials may not be worn into surgery, please bring cases for these belongings   For patients admitted to the hospital, discharge time will be determined by your treatment team.   Patients discharged the day of surgery will not be allowed to drive home, and someone needs to stay with them for 24 hours.   SURGICAL WAITING ROOM VISITATION Patients having surgery or a procedure may have no more than 2 support people in the waiting area - these visitors may rotate.   Children under the age of 79 must have an adult with them who is not the patient. If the patient needs to stay at the hospital during part of their recovery, the visitor guidelines for  inpatient rooms apply. Pre-op nurse will coordinate an appropriate time for 1 support person to accompany patient in pre-op.  This support person may not rotate.   Please refer to [email or web address] for the visitor guidelines for Inpatients (after your surgery is over and you are in a regular room).     If you received a COVID test during your pre-op visit, it is requested that you wear a mask when out in public, stay away from anyone that may not be feeling well, and notify your surgeon if you develop symptoms. If you have been in contact with anyone that has tested positive in the last 10 days, please notify your surgeon.    Please read over the following fact sheets that you were given.

## 2022-11-23 NOTE — Progress Notes (Signed)
PCP - Corwin Levins, MD Cardiologist - Truett Mainland, MD  PPM/ICD - Denies  Chest x-ray - 11/16/2022 EKG - 08/14/2022 Stress Test - 08/03/2015 ECHO - 03/03/2022 Cardiac Cath - 03/24/2009  Sleep Study - 07/18/2021 Results were negative  DM: denies  Blood Thinner Instructions: N/A Aspirin Instructions: N/A  ERAS Protcol - Yes PRE-SURGERY Ensure or G2- Ensure  COVID TEST- No   Anesthesia review: Yes, cardiac history  Patient denies shortness of breath, fever, cough and chest pain at PAT appointment   All instructions explained to the patient, with a verbal understanding of the material. Patient agrees to go over the instructions while at home for a better understanding.The opportunity to ask questions was provided.

## 2022-11-24 ENCOUNTER — Encounter (HOSPITAL_COMMUNITY): Payer: Self-pay

## 2022-11-24 NOTE — Progress Notes (Signed)
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   [facility] / [facility]   Surgeons: Estill Bamberg, MD       DISCUSSION: Patient is a 62 year old male scheduled for the above procedure.  History includes former smoker, HIV (on [place]), HTN, hypertensive cardiomyopathy (EF 37% Fall 2021, normal coronaries 04/2020 CCTA, EF 60-65% 03/2022 echo), myopericarditis (2010), dyspnea, COPD, GERD (with esophagitis), CKD, prostate cancer (s/p robotic radical prostatectomy 08/18/13), asthma, anemia, osteoarthritis (right TKA 08/01/16), spinal surgery (C3-6 ACDF 09/29/20), viral meningitis (06/2021), glaucoma (right eye), cholecystectomy (11/06/14). Normal coronaries, 0 coronary calcium score 04/07/20 CCTA.  Per GI note by Dr. Rhea Belton on 11/22/22. 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 Surveillance EGD is planned for July. He wrote, "Upcoming cervical disc fusion --I asked that he make his surgeon aware that he is under treatment for gastric ulcer which has been nonbleeding at this point.  He should strictly avoid NSAIDs.  If a blood thinner is necessary he may wish to delay surgery but will defer this to the patient's surgeon." I notified Lupita Leash at Dr. Yevette Edwards of this. It sounds like patient's sister had also called to let them know. She will have Dr. Yevette Edwards review.   Last pulmonology visit with Dr. Vassie Loll was on 11/10/22. He was treated with a course of prednisone for worsening dyspnea, possibly related to pollen. No clear infective exacerbation, but  CXR ordered to assess and showed no acute process. Advised to use albuterol and continue Breztri. Was treated for CAP 06/2022. Follow-up in 3 months with PFTs prior. He denied chest pain and SOB at PAT RN visit.   Cardiology clearance letter per Dr. Yates Decamp on 11/22/22 is under Letters tab, "Leafy Half is at low risk, from a cardiac standpoint, for his upcoming procedure: posterior deompression.  It is ok to proceed without further cardiac testing."  Anesthesia team to evaluate on the day of surgery.   VS: BP 117/86   Pulse 79   Temp 36.8 C (Oral)   Resp 18   Ht 5\' 7"  (1.702 m)   Wt 92.7 kg   SpO2 96%   BMI 32.01 kg/m    PROVIDERS: Corwin Levins, MD is PCP  - Truett Mainland, MD is cardiologist. Last visit 02/08/22. Hydralazine ordered for better BP control. Continue metoprolol.     Intolerant to several anti-hypertensives.  Cyril Mourning, MD is pulmonologist - Erick Blinks, MD is GI - Johny Sax, MD is ID. Last visit 10/31/22. HE notes plans for cervical spine surgery. Hoping to get him suppressed prior to surgery. Has occasionally missed doses of Biktarvy, possibly related to nausea. CD4 count "good." VL elevated so genotype was sent.  - Dohmeier, Porfirio Mylar, MD is neurologist. New evaluation 11/16/22 for headaches, neuropathy, and right arm numbness progressive since ACDF in 2022 with pending posterior cervical fusion.  - Reginia Naas, MD is ophthalmologist ([facility])   LABS: Preoperative labs noted. AST 22, ALT 18 10/17/22.  (all labs ordered are listed, but only abnormal results are displayed)  Labs Reviewed  BASIC METABOLIC PANEL -  Abnormal; Notable for the following components:      Result Value   Creatinine, Ser 1.36 (*)    Calcium 8.8 (*)    GFR, Estimated 60 (*)    All other components within normal limits  SURGICAL PCR SCREEN  CBC  TYPE AND SCREEN    OTHER: EGD 11/08/22: IMPRESSION: -Benign-appearing esophageal stenosis.  Intermittent ringlike structure  unable to photo.  Dilated.  20 mm. -LA grade a reflux esophagitis with no bleeding.  Biopsied. -Nonbleeding gastric ulcers with no stigmata of bleeding.  Biopsied.  Largest 2 cm and deep. -Erosive gastropathy with no stigmata or recent bleeding.  Biopsied. -The examination was otherwise normal.  07/22/14 PFTs 07/22/14: FVC 1.90 (47%), FEV1 1.12 (34%), FEV1/FVC 59% (74%), FEF25-75% 0.60 (16%). Severe airway obstruction, with low vital capacity.   Walk Test 07/22/14: Walked RA  2 laps @ 185 ft each stopped due to  Too tired to complete, no sob or tachypnea/ no desats at nl pace       IMAGES: CXR 11/10/22: FINDINGS: The cardiomediastinal silhouette is unchanged in contour. No focal pulmonary opacity. No pleural effusion or pneumothorax. The visualized upper abdomen is unremarkable. No acute osseous abnormality. IMPRESSION: No active cardiopulmonary disease.  MRI Head & C-spine 10/10/22: MPRESSION: MRI head: No evidence of acute intracranial abnormality or metastatic disease.   MRI cervical spine: 1. C3-C6 ACDF. Severe left foraminal stenosis at C5-C6. Moderate to severe foraminal stenosis on the left at C6-C7 and bilaterally at C7-T1. Milder foraminal stenosis detailed above. 2. No visible suspicious bone lesion on this noncontrast MRI.   CT Chest/abd/pelvis 10/09/22: IMPRESSION: 1. 2 subcentimeter left iliac lytic lesions, 1 unchanged since 03/31/2013 and the other unchanged since 06/20/2021. This favors a nonaggressive etiology. No other focal osseous lesion identified. 2. No adenopathy within the chest abdomen or pelvis. 3. Recurrent or residual rectal wall thickening since 06/20/2021. Favor proctitis. Correlate with colon cancer screening history and if not up-to-date, consider colonoscopy. 4. Interpolar right renal lesion measures greater than fluid density but is similar in size to the prior exam, where it was simple appearing. This favors a hemorrhagic/proteinaceous cyst. 5.  Prostatectomy, without evidence of metastatic disease. 6. Right adrenal nodule which was characterized as an adenoma back on 02/28/2013 . In the absence of clinically indicated signs/symptoms require(s) no independent follow-up. 7.  Emphysema (ICD10-J43.9).  CT Left shoulder 10/09/22: IMPRESSION: - No evidence of acute fracture. Mild subcutaneous soft tissue swelling along the posterosuperior shoulder. - Mild glenohumeral and moderate AC joint osteoarthritis. - Os acromiale with degenerative changes across the synchondrosis. - Subacromial spurring and greater tuberosity irregularity, findings which can be seen in the setting of chronic distal rotator cuff disease.   EKG: 08/14/2022: NSR   CV: Echocardiogram 03/03/2022:  Normal LV systolic function with visual EF 60-65%. Left ventricle cavity  is normal in size. Mild concentric hypertrophy of the left ventricle.  Normal global wall motion. Normal diastolic filling pattern, normal LAP.  Calculated EF 61%.  Structurally normal tricuspid valve with no regurgitation. No evidence of  pulmonary hypertension.  compared to echo from 2021, EF has now normalized.  - Comparison 11/4/221: LVEF 37%, moderate concentric LVH, global LV hypokinesis, grade 1 DD, mild MR/PR   CT Coronary 04/07/2020: IMPRESSION: 1. Coronary calcium score of 0. 2. Normal coronary origin with right dominance. 3. Normal coronary arteries. RECOMMENDATIONS: 1. No evidence of CAD (0%). Consider non-atherosclerotic causes of chest pain.   Cardiac cath 03/23/09: Normal coronary arteries.   Normal left  ventricular function. Recommendations: Suspect the patient has mild pericarditis...   He was treated with colchicine.     Past Medical History:  Diagnosis Date   Asthma    very rare   Chronic anemia    normocytic   Chronic folliculitis    Chronic kidney disease    Stage 2   COPD (chronic obstructive pulmonary disease) (HCC)    Dyspnea    Environmental and  seasonal allergies    Glaucoma    right   H/O pericarditis    2010--  myopercarditis--  resolved   Headache(784.0)    HX SEVERE FRONTAL HA'S   Hiatal hernia    History of gastric ulcer    nonbleeding gastric ulcer 58//24   History of kidney stones    History of MRSA infection 2010   infected boil   HIV (human immunodeficiency virus infection) (HCC) 1988   Hypertension    Hypertensive cardiomyopathy (HCC)    EF 37%, normal coronaries 04/2020 CCTA; LVEF 60-65% 03/2022 echo   Lytic bone lesion of hip    WORK-UP DONE BY ONCOLOGIST DR HA --  NOT MALIGNANT   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   Pneumonia    hx of   Post concussion syndrome    resolved   Prostate cancer (HCC) 04/25/2013   gleason 3+3=6, volume 30 gm   Viral meningitis    06/2021    Past Surgical History:  Procedure Laterality Date   ACDCF  09/29/2020   ANTERIOR CERVICAL DECOMPRESSION/DISCECTOMY FUSION 4 LEVELS N/A 09/29/2020   Procedure: ANTERIOR CERVICAL DECOMPRESSION FUSION CERVICAL 3-CERVICAL 4 , CERVICAL 4 - CERVICAL 5, CERVICAL 5 - CERVICAL 6 WITH INSTRUMENTATION AND ALLOGRAFT;  Surgeon: Estill Bamberg, MD;  Location: [facility];  Service: Orthopedics;  Laterality: N/A;   CARDIAC CATHETERIZATION  03-23-2009  DR COOPER   NORMAL CORONARY ARTERIES   CHOLECYSTECTOMY N/A 11/06/2014   Procedure: LAPAROSCOPIC CHOLECYSTECTOMY WITH INTRAOPERATIVE CHOLANGIOGRAM;  Surgeon: Jimmye Norman, MD;  Location: [facility] OR;  Service: General;  Laterality: N/A;   COLONOSCOPY  12/26/2011   Procedure: COLONOSCOPY;  Surgeon: Shirley Friar, MD;  Location: [facility] ENDOSCOPY;  Service: Endoscopy;  Laterality: N/A;   COLONOSCOPY     COLONOSCOPY WITH PROPOFOL N/A 07/29/2014   Procedure: COLONOSCOPY WITH PROPOFOL;  Surgeon: Shirley Friar, MD;  Location: [facility] ENDOSCOPY;  Service: Endoscopy;  Laterality: N/A;   CYSTOSCOPY N/A 05/18/2018   Procedure: CYSTOSCOPY REMOVAL FOREIGN BODY;  Surgeon: Ihor Gully, MD;  Location: [facility] ORS;  Service: Urology;   Laterality: N/A;   CYSTOSCOPY/RETROGRADE/URETEROSCOPY Bilateral 04/25/2013   Procedure: CYSTOSCOPY/ BILATERAL RETROGRADES; BLADDER BIOPSIES;  Surgeon: Sebastian Ache, MD;  Location: [facility];  Service: Urology;  Laterality: Bilateral;   DENTAL EXAMINATION UNDER ANESTHESIA     ESOPHAGOGASTRODUODENOSCOPY  12/26/2011   Procedure: ESOPHAGOGASTRODUODENOSCOPY (EGD);  Surgeon: Shirley Friar, MD;  Location: [facility]OSCOPY;  Service: Endoscopy;  Laterality: N/A;   ESOPHAGOGASTRODUODENOSCOPY (EGD) WITH PROPOFOL N/A 07/29/2014   Procedure: ESOPHAGOGASTRODUODENOSCOPY (EGD) WITH PROPOFOL;  Surgeon: Shirley Friar, MD;  Location: [facility] ENDOSCOPY;  Service: Endoscopy;  Laterality: N/A;   EXCISION CHRONIC LEFT BREAST ABSCESS  09-21-2010   EXCISIONAL BX LEFT BREAST MASS/  I  &  D LEFT BREAST ABSCESS  03-24-2009   KNEE ARTHROSCOPY Right 1985   left axilla biopsy  04/2013   left hip biopsy  10/2012   LYMPHADENECTOMY Bilateral 08/18/2013   Procedure: LYMPHADENECTOMY "PELVIC LYMPH NODE DISSECTION";  Surgeon: Sebastian Ache, MD;  Location: [facility]S;  Service:  Urology;  Laterality: Bilateral;   PROSTATE BIOPSY N/A 04/25/2013   Procedure: BIOPSY TRANSRECTAL ULTRASONIC PROSTATE (TUBP);  Surgeon: Sebastian Ache, MD;  Location: [facility];  Service: Urology;  Laterality: N/A;   ROBOT ASSISTED LAPAROSCOPIC RADICAL PROSTATECTOMY N/A 08/18/2013   Procedure: ROBOTIC ASSISTED LAPAROSCOPIC RADICAL PROSTATECTOMY;  Surgeon: Sebastian Ache, MD;  Location: [facility];  Service: Urology;  Laterality: N/A;   TOTAL KNEE ARTHROPLASTY Right 08/01/2016   08/17/16   TOTAL KNEE ARTHROPLASTY Right 08/01/2016   Procedure: TOTAL KNEE ARTHROPLASTY;  Surgeon: Sheral Apley, MD;  Location: [facility];  Service: Orthopedics;  Laterality: Right;   UPPER GASTROINTESTINAL ENDOSCOPY     UPPER LEG SOFT TISSUE BIOPSY Left 2012   lthigh    MEDICATIONS:  albuterol (PROVENTIL) (2.5 MG/3ML) 0.083% nebulizer solution    albuterol (VENTOLIN HFA) 108 (90 Base) MCG/ACT inhaler   ALPRAZolam (XANAX) 0.5 MG tablet   azelastine (ASTELIN) 0.1 % nasal spray   BIKTARVY 50-200-25 MG TABS tablet   BREZTRI AEROSPHERE 160-9-4.8 MCG/ACT AERO   cetirizine (ZYRTEC) 10 MG tablet   Cholecalciferol (VITAMIN D3) 250 MCG (10000 UT) capsule   cyclobenzaprine (FLEXERIL) 10 MG tablet   diphenhydrAMINE (BENADRYL) 25 MG tablet   dorzolamide (TRUSOPT) 2 % ophthalmic solution   EPINEPHrine 0.3 mg/0.3 mL IJ SOAJ injection   gabapentin (NEURONTIN) 300 MG capsule   Glucos-Chond-Hyal Ac-Ca Fructo (MOVE FREE JOINT HEALTH ADVANCE PO)   HYDROcodone-acetaminophen (NORCO/VICODIN) 5-325 MG tablet   hydrOXYzine (ATARAX) 50 MG tablet   meclizine (ANTIVERT) 25 MG tablet   methocarbamol (ROBAXIN) 500 MG tablet   metoprolol succinate (TOPROL-XL) 100 MG 24 hr tablet   montelukast (SINGULAIR) 10 MG tablet   Multiple Vitamin (MULTIVITAMIN WITH MINERALS) TABS tablet   mupirocin ointment (BACTROBAN) 2 %   neomycin-polymyxin-hydrocortisone (CORTISPORIN) OTIC solution   nitroGLYCERIN (NITROSTAT) 0.4 MG SL tablet   ondansetron (ZOFRAN-ODT) 4 MG disintegrating tablet   OVER THE COUNTER MEDICATION   pantoprazole (PROTONIX) 40 MG tablet   QNASL 80 MCG/ACT AERS   QULIPTA 30 MG TABS   rosuvastatin (CRESTOR) 20 MG tablet   tizanidine (ZANAFLEX) 6 MG capsule   torsemide (DEMADEX) 20 MG tablet   Ubrogepant (UBRELVY) 100 MG TABS   No current facility-administered medications for this encounter.    Shonna Chock, PA-C Surgical Short Stay/Anesthesiology [facility] Phone [phone] [facility] Phone [phone] 11/24/2022 10:53 AM

## 2022-11-24 NOTE — Anesthesia Preprocedure Evaluation (Signed)
Anesthesia Evaluation  Patient identified by MRN, date of birth, ID band Patient awake    Reviewed: Allergy & Precautions, NPO status , Patient's Chart, lab work & pertinent test results, reviewed documented beta blocker date and time   Airway Mallampati: II  TM Distance: >3 FB Neck ROM: Limited    Dental  (+) Teeth Intact, Dental Advisory Given, Caps   Pulmonary asthma , COPD,  COPD inhaler, former smoker   Pulmonary exam normal breath sounds clear to auscultation       Cardiovascular hypertension, Pt. on home beta blockers Normal cardiovascular exam Rhythm:Regular Rate:Normal     Neuro/Psych  Headaches PSYCHIATRIC DISORDERS     Dementia NEUROFORAMINAL STENOSIS AND SPINAL CORD COMPRESSION SPANNING CERVICAL 3 - CERVICAL 6  Neuromuscular disease    GI/Hepatic Neg liver ROS, hiatal hernia,GERD  Medicated,,  Endo/Other  negative endocrine ROS  Obesity   Renal/GU Renal InsufficiencyRenal disease   Prostate cancer  negative genitourinary   Musculoskeletal  (+) Arthritis ,    Abdominal   Peds  Hematology negative hematology ROS (+)   Anesthesia Other Findings hocardiogram 03/03/2022: Normal LV systolic function with visual EF 60-65%. Left ventricle cavity is normal in size. Mild concentric hypertrophy of the left ventricle. Normal global wall motion. Normal diastolic filling pattern, normal LAP. Calculated EF 61%. Structurally normal tricuspid valve with no regurgitation. No evidence of pulmonary hypertension. compared to echo from 2021, EF has now normalized.  Imagi    Reproductive/Obstetrics                             Anesthesia Physical Anesthesia Plan  ASA: 3  Anesthesia Plan: General   Post-op Pain Management:    Induction: Intravenous  PONV Risk Score and Plan: 2 and Ondansetron, Treatment may vary due to age or medical condition and Midazolam  Airway Management Planned:  Oral ETT and Video Laryngoscope Planned  Additional Equipment: None  Intra-op Plan:   Post-operative Plan: Extubation in OR  Informed Consent: I have reviewed the patients History and Physical, chart, labs and discussed the procedure including the risks, benefits and alternatives for the proposed anesthesia with the patient or authorized representative who has indicated his/her understanding and acceptance.     Dental advisory given  Plan Discussed with: CRNA  Anesthesia Plan Comments: (PAT note written 11/24/2022 by Shonna Chock, PA-C.   History includes former smoker, HIV (on [place]), HTN, hypertensive cardiomyopathy (EF 37% Fall 2021, normal coronaries 04/2020 CCTA, EF 60-65% 03/2022 echo), myopericarditis (2010), dyspnea, COPD, GERD (with esophagitis), CKD, prostate cancer (s/p robotic radical prostatectomy 08/18/13), asthma, anemia, osteoarthritis (right TKA 08/01/16), spinal surgery (C3-6 ACDF 09/29/20), viral meningitis (06/2021), glaucoma (right eye), cholecystectomy (11/06/14). Normal coronaries, 0 coronary calcium score 04/07/20 CCTA.   Per GI note by Dr. Rhea Belton on 11/22/22. Mr. 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 Surveillance EGD is planned for July. He wrote, "Upcoming cervical disc fusion --I asked that he make his surgeon aware that he is under treatment for gastric ulcer which has been nonbleeding at this point.  He should strictly avoid NSAIDs.  If a blood thinner is necessary he may wish to delay surgery but will defer this to the patient's surgeon." I notified Lupita Leash at Dr. Yevette Edwards of this. It sounds like patient's sister had also called to let them know. She will have Dr. Yevette Edwards review.  Last pulmonology visit with Dr. Vassie Loll was on 11/10/22. He was treated with a course of prednisone for worsening dyspnea, possibly related to pollen.  No clear infective exacerbation, but CXR ordered to assess and showed no acute process. Advised to use albuterol and continue Breztri. Was treated for CAP 06/2022. Follow-up in 3 months with PFTs prior. He denied chest pain and SOB at PAT RN visit.    Cardiology clearance letter per Dr. Yates Decamp on 11/22/22 is under Letters tab, "Leafy Half is at low risk, from a cardiac standpoint, for his upcoming procedure: posterior deompression.  It is ok to proceed without further cardiac testing."   Anesthesia team to evaluate on the day of surgery.     )        Anesthesia Quick Evaluation

## 2022-11-29 DIAGNOSIS — J3089 Other allergic rhinitis: Secondary | ICD-10-CM | POA: Diagnosis not present

## 2022-11-29 DIAGNOSIS — J4530 Mild persistent asthma, uncomplicated: Secondary | ICD-10-CM | POA: Diagnosis not present

## 2022-11-29 DIAGNOSIS — J449 Chronic obstructive pulmonary disease, unspecified: Secondary | ICD-10-CM | POA: Diagnosis not present

## 2022-11-29 DIAGNOSIS — J301 Allergic rhinitis due to pollen: Secondary | ICD-10-CM | POA: Diagnosis not present

## 2022-11-30 ENCOUNTER — Other Ambulatory Visit: Payer: Self-pay

## 2022-11-30 ENCOUNTER — Inpatient Hospital Stay (HOSPITAL_COMMUNITY): Payer: 59

## 2022-11-30 ENCOUNTER — Inpatient Hospital Stay (HOSPITAL_COMMUNITY): Payer: 59 | Admitting: Anesthesiology

## 2022-11-30 ENCOUNTER — Inpatient Hospital Stay (HOSPITAL_COMMUNITY)
Admission: RE | Admit: 2022-11-30 | Discharge: 2022-12-01 | DRG: 472 | Disposition: A | Discharge status: Home / Self Care | Payer: 59 | Source: Ambulatory Visit | Attending: Orthopedic Surgery | Admitting: Orthopedic Surgery

## 2022-11-30 ENCOUNTER — Inpatient Hospital Stay (HOSPITAL_COMMUNITY): Payer: 59 | Admitting: Vascular Surgery

## 2022-11-30 ENCOUNTER — Inpatient Hospital Stay (HOSPITAL_COMMUNITY)
Admission: RE | Disposition: A | Discharge status: Home / Self Care | Payer: Self-pay | Source: Ambulatory Visit | Attending: Orthopedic Surgery

## 2022-11-30 DIAGNOSIS — Z881 Allergy status to other antibiotic agents status: Secondary | ICD-10-CM | POA: Diagnosis not present

## 2022-11-30 DIAGNOSIS — Z825 Family history of asthma and other chronic lower respiratory diseases: Secondary | ICD-10-CM

## 2022-11-30 DIAGNOSIS — Z886 Allergy status to analgesic agent status: Secondary | ICD-10-CM | POA: Diagnosis not present

## 2022-11-30 DIAGNOSIS — I43 Cardiomyopathy in diseases classified elsewhere: Secondary | ICD-10-CM | POA: Diagnosis present

## 2022-11-30 DIAGNOSIS — M62830 Muscle spasm of back: Secondary | ICD-10-CM | POA: Diagnosis present

## 2022-11-30 DIAGNOSIS — I129 Hypertensive chronic kidney disease with stage 1 through stage 4 chronic kidney disease, or unspecified chronic kidney disease: Secondary | ICD-10-CM | POA: Diagnosis not present

## 2022-11-30 DIAGNOSIS — J449 Chronic obstructive pulmonary disease, unspecified: Secondary | ICD-10-CM | POA: Diagnosis not present

## 2022-11-30 DIAGNOSIS — Z880 Allergy status to penicillin: Secondary | ICD-10-CM

## 2022-11-30 DIAGNOSIS — M960 Pseudarthrosis after fusion or arthrodesis: Secondary | ICD-10-CM | POA: Diagnosis not present

## 2022-11-30 DIAGNOSIS — I10 Essential (primary) hypertension: Secondary | ICD-10-CM

## 2022-11-30 DIAGNOSIS — N2889 Other specified disorders of kidney and ureter: Secondary | ICD-10-CM | POA: Diagnosis not present

## 2022-11-30 DIAGNOSIS — Z87891 Personal history of nicotine dependence: Secondary | ICD-10-CM

## 2022-11-30 DIAGNOSIS — Y848 Other medical procedures as the cause of abnormal reaction of the patient, or of later complication, without mention of misadventure at the time of the procedure: Secondary | ICD-10-CM | POA: Diagnosis present

## 2022-11-30 DIAGNOSIS — R1310 Dysphagia, unspecified: Secondary | ICD-10-CM | POA: Diagnosis not present

## 2022-11-30 DIAGNOSIS — J4489 Other specified chronic obstructive pulmonary disease: Secondary | ICD-10-CM | POA: Diagnosis present

## 2022-11-30 DIAGNOSIS — Z833 Family history of diabetes mellitus: Secondary | ICD-10-CM

## 2022-11-30 DIAGNOSIS — Z8042 Family history of malignant neoplasm of prostate: Secondary | ICD-10-CM

## 2022-11-30 DIAGNOSIS — Z808 Family history of malignant neoplasm of other organs or systems: Secondary | ICD-10-CM | POA: Diagnosis not present

## 2022-11-30 DIAGNOSIS — M50123 Cervical disc disorder at C6-C7 level with radiculopathy: Secondary | ICD-10-CM | POA: Diagnosis present

## 2022-11-30 DIAGNOSIS — Z885 Allergy status to narcotic agent status: Secondary | ICD-10-CM

## 2022-11-30 DIAGNOSIS — M4803 Spinal stenosis, cervicothoracic region: Secondary | ICD-10-CM

## 2022-11-30 DIAGNOSIS — M4802 Spinal stenosis, cervical region: Secondary | ICD-10-CM | POA: Diagnosis not present

## 2022-11-30 DIAGNOSIS — Z883 Allergy status to other anti-infective agents status: Secondary | ICD-10-CM | POA: Diagnosis not present

## 2022-11-30 DIAGNOSIS — M5010 Cervical disc disorder with radiculopathy, unspecified cervical region: Principal | ICD-10-CM | POA: Diagnosis present

## 2022-11-30 DIAGNOSIS — M5033 Other cervical disc degeneration, cervicothoracic region: Secondary | ICD-10-CM | POA: Diagnosis not present

## 2022-11-30 DIAGNOSIS — Z21 Asymptomatic human immunodeficiency virus [HIV] infection status: Secondary | ICD-10-CM | POA: Diagnosis present

## 2022-11-30 DIAGNOSIS — Z8546 Personal history of malignant neoplasm of prostate: Secondary | ICD-10-CM | POA: Diagnosis not present

## 2022-11-30 DIAGNOSIS — Z91011 Allergy to milk products: Secondary | ICD-10-CM

## 2022-11-30 DIAGNOSIS — Z981 Arthrodesis status: Secondary | ICD-10-CM | POA: Diagnosis not present

## 2022-11-30 DIAGNOSIS — C7951 Secondary malignant neoplasm of bone: Secondary | ICD-10-CM | POA: Diagnosis not present

## 2022-11-30 DIAGNOSIS — M50323 Other cervical disc degeneration at C6-C7 level: Secondary | ICD-10-CM | POA: Diagnosis not present

## 2022-11-30 DIAGNOSIS — Z96651 Presence of right artificial knee joint: Secondary | ICD-10-CM | POA: Diagnosis present

## 2022-11-30 DIAGNOSIS — Z882 Allergy status to sulfonamides status: Secondary | ICD-10-CM

## 2022-11-30 DIAGNOSIS — Z91018 Allergy to other foods: Secondary | ICD-10-CM

## 2022-11-30 DIAGNOSIS — Z823 Family history of stroke: Secondary | ICD-10-CM

## 2022-11-30 DIAGNOSIS — L97511 Non-pressure chronic ulcer of other part of right foot limited to breakdown of skin: Secondary | ICD-10-CM | POA: Diagnosis not present

## 2022-11-30 DIAGNOSIS — N182 Chronic kidney disease, stage 2 (mild): Secondary | ICD-10-CM | POA: Diagnosis not present

## 2022-11-30 DIAGNOSIS — Z8249 Family history of ischemic heart disease and other diseases of the circulatory system: Secondary | ICD-10-CM

## 2022-11-30 DIAGNOSIS — M5013 Cervical disc disorder with radiculopathy, cervicothoracic region: Secondary | ICD-10-CM | POA: Diagnosis not present

## 2022-11-30 DIAGNOSIS — Z888 Allergy status to other drugs, medicaments and biological substances status: Secondary | ICD-10-CM

## 2022-11-30 HISTORY — PX: POSTERIOR CERVICAL FUSION/FORAMINOTOMY: SHX5038

## 2022-11-30 SURGERY — POSTERIOR CERVICAL FUSION/FORAMINOTOMY LEVEL 4
Anesthesia: General

## 2022-11-30 MED ORDER — LACTATED RINGERS IV SOLN: INTRAVENOUS | Status: DC

## 2022-11-30 MED ORDER — BUPIVACAINE-EPINEPHRINE (PF) 0.25% -1:200000 IJ SOLN
INTRAMUSCULAR | Status: AC
  Filled 2022-11-30: qty 30

## 2022-11-30 MED ORDER — SUGAMMADEX SODIUM 200 MG/2ML IV SOLN
INTRAVENOUS | Status: DC | PRN
  Administered 2022-11-30: 400 mg via INTRAVENOUS

## 2022-11-30 MED ORDER — DIPHENHYDRAMINE HCL 25 MG PO TABS: 25.0000 mg | ORAL_TABLET | Freq: Four times a day (QID) | ORAL | Status: DC | PRN

## 2022-11-30 MED ORDER — MIDAZOLAM HCL 2 MG/2ML IJ SOLN
INTRAMUSCULAR | Status: DC | PRN
  Administered 2022-11-30: 2 mg via INTRAVENOUS

## 2022-11-30 MED ORDER — ONDANSETRON HCL 4 MG PO TABS: 4.0000 mg | ORAL_TABLET | Freq: Four times a day (QID) | ORAL | Status: DC | PRN

## 2022-11-30 MED ORDER — PANTOPRAZOLE SODIUM 40 MG PO TBEC: 40.0000 mg | DELAYED_RELEASE_TABLET | Freq: Two times a day (BID) | ORAL | Status: DC

## 2022-11-30 MED ORDER — BUPIVACAINE-EPINEPHRINE 0.25% -1:200000 IJ SOLN
INTRAMUSCULAR | Status: DC | PRN
  Administered 2022-11-30: 20 mL
  Administered 2022-11-30: 9 mL

## 2022-11-30 MED ORDER — ORAL CARE MOUTH RINSE: 15.0000 mL | Freq: Once | OROMUCOSAL | Status: AC

## 2022-11-30 MED ORDER — FENTANYL CITRATE (PF) 250 MCG/5ML IJ SOLN
INTRAMUSCULAR | Status: AC
  Filled 2022-11-30: qty 5

## 2022-11-30 MED ORDER — DOCUSATE SODIUM 100 MG PO CAPS
100.0000 mg | ORAL_CAPSULE | Freq: Two times a day (BID) | ORAL | Status: DC
  Administered 2022-11-30 – 2022-12-01 (×2): 100 mg via ORAL
  Filled 2022-11-30 (×2): qty 1

## 2022-11-30 MED ORDER — PHENOL 1.4 % MT LIQD: 1.0000 | OROMUCOSAL | Status: DC | PRN

## 2022-11-30 MED ORDER — HYDROMORPHONE HCL 2 MG PO TABS
2.0000 mg | ORAL_TABLET | ORAL | Status: DC | PRN
  Administered 2022-11-30 – 2022-12-01 (×4): 2 mg via ORAL
  Filled 2022-11-30 (×4): qty 1

## 2022-11-30 MED ORDER — HYDROMORPHONE HCL 1 MG/ML IJ SOLN
0.2500 mg | INTRAMUSCULAR | Status: DC | PRN
  Administered 2022-11-30 (×2): 0.5 mg via INTRAVENOUS

## 2022-11-30 MED ORDER — BUPIVACAINE LIPOSOME 1.3 % IJ SUSP
INTRAMUSCULAR | Status: DC | PRN
  Administered 2022-11-30: 20 mL

## 2022-11-30 MED ORDER — ROCURONIUM BROMIDE 10 MG/ML (PF) SYRINGE
PREFILLED_SYRINGE | INTRAVENOUS | Status: DC | PRN
  Administered 2022-11-30: 20 mg via INTRAVENOUS
  Administered 2022-11-30: 60 mg via INTRAVENOUS
  Administered 2022-11-30 (×3): 20 mg via INTRAVENOUS

## 2022-11-30 MED ORDER — MEPERIDINE HCL 25 MG/ML IJ SOLN: 6.2500 mg | INTRAMUSCULAR | Status: DC | PRN

## 2022-11-30 MED ORDER — DORZOLAMIDE HCL 2 % OP SOLN
1.0000 [drp] | Freq: Two times a day (BID) | OPHTHALMIC | Status: DC
  Administered 2022-11-30: 1 [drp] via OPHTHALMIC
  Filled 2022-11-30: qty 10

## 2022-11-30 MED ORDER — LACTATED RINGERS IV SOLN: INTRAVENOUS | Status: DC | PRN

## 2022-11-30 MED ORDER — ONDANSETRON 4 MG PO TBDP: 4.0000 mg | ORAL_TABLET | ORAL | Status: DC | PRN

## 2022-11-30 MED ORDER — HYDROMORPHONE HCL 1 MG/ML IJ SOLN
0.5000 mg | INTRAMUSCULAR | Status: DC | PRN
  Administered 2022-11-30 – 2022-12-01 (×2): 1 mg via INTRAVENOUS
  Filled 2022-11-30 (×2): qty 1

## 2022-11-30 MED ORDER — CEFAZOLIN SODIUM-DEXTROSE 2-4 GM/100ML-% IV SOLN
2.0000 g | Freq: Three times a day (TID) | INTRAVENOUS | Status: AC
  Administered 2022-11-30 – 2022-12-01 (×2): 2 g via INTRAVENOUS
  Filled 2022-11-30 (×2): qty 100

## 2022-11-30 MED ORDER — BACITRACIN ZINC 500 UNIT/GM EX OINT
TOPICAL_OINTMENT | CUTANEOUS | Status: DC | PRN
  Administered 2022-11-30: 1 via TOPICAL

## 2022-11-30 MED ORDER — ONDANSETRON HCL 4 MG/2ML IJ SOLN
4.0000 mg | Freq: Four times a day (QID) | INTRAMUSCULAR | Status: DC | PRN
  Administered 2022-11-30: 4 mg via INTRAVENOUS

## 2022-11-30 MED ORDER — ZOLPIDEM TARTRATE 5 MG PO TABS: 5.0000 mg | ORAL_TABLET | Freq: Every evening | ORAL | Status: DC | PRN

## 2022-11-30 MED ORDER — ALUM & MAG HYDROXIDE-SIMETH 200-200-20 MG/5ML PO SUSP: 30.0000 mL | Freq: Four times a day (QID) | ORAL | Status: DC | PRN

## 2022-11-30 MED ORDER — UBROGEPANT 100 MG PO TABS: 100.0000 mg | ORAL_TABLET | ORAL | Status: DC

## 2022-11-30 MED ORDER — SODIUM CHLORIDE 0.9% FLUSH: 3.0000 mL | INTRAVENOUS | Status: DC | PRN

## 2022-11-30 MED ORDER — FLEET ENEMA 7-19 GM/118ML RE ENEM: 1.0000 | ENEMA | Freq: Once | RECTAL | Status: DC | PRN

## 2022-11-30 MED ORDER — METHOCARBAMOL 1000 MG/10ML IJ SOLN: 500.0000 mg | Freq: Four times a day (QID) | INTRAVENOUS | Status: DC | PRN

## 2022-11-30 MED ORDER — MIDAZOLAM HCL 2 MG/2ML IJ SOLN
INTRAMUSCULAR | Status: AC
  Filled 2022-11-30: qty 2

## 2022-11-30 MED ORDER — ROCURONIUM BROMIDE 10 MG/ML (PF) SYRINGE
PREFILLED_SYRINGE | INTRAVENOUS | Status: AC
  Filled 2022-11-30: qty 10

## 2022-11-30 MED ORDER — NITROGLYCERIN 0.4 MG SL SUBL: 0.4000 mg | SUBLINGUAL_TABLET | SUBLINGUAL | Status: DC | PRN

## 2022-11-30 MED ORDER — METOPROLOL SUCCINATE ER 100 MG PO TB24
100.0000 mg | ORAL_TABLET | Freq: Every day | ORAL | Status: DC
  Filled 2022-11-30 (×2): qty 1

## 2022-11-30 MED ORDER — PROPOFOL 10 MG/ML IV BOLUS
INTRAVENOUS | Status: AC
  Filled 2022-11-30: qty 20

## 2022-11-30 MED ORDER — ADULT MULTIVITAMIN W/MINERALS CH
1.0000 | ORAL_TABLET | Freq: Every day | ORAL | Status: DC
  Filled 2022-11-30 (×2): qty 1

## 2022-11-30 MED ORDER — THROMBIN 20000 UNITS EX SOLR
OROMUCOSAL | Status: DC | PRN
  Administered 2022-11-30: 20 mL via TOPICAL

## 2022-11-30 MED ORDER — LIDOCAINE 2% (20 MG/ML) 5 ML SYRINGE
INTRAMUSCULAR | Status: AC
  Filled 2022-11-30: qty 5

## 2022-11-30 MED ORDER — VITAMIN D 25 MCG (1000 UNIT) PO TABS
10000.0000 [IU] | ORAL_TABLET | Freq: Every day | ORAL | Status: DC
  Filled 2022-11-30: qty 10

## 2022-11-30 MED ORDER — MECLIZINE HCL 25 MG PO TABS: 25.0000 mg | ORAL_TABLET | Freq: Every day | ORAL | Status: DC | PRN

## 2022-11-30 MED ORDER — EPHEDRINE 5 MG/ML INJ
INTRAVENOUS | Status: AC
  Filled 2022-11-30: qty 5

## 2022-11-30 MED ORDER — PHENYLEPHRINE HCL-NACL 20-0.9 MG/250ML-% IV SOLN
INTRAVENOUS | Status: DC | PRN
  Administered 2022-11-30: 20 ug/min via INTRAVENOUS

## 2022-11-30 MED ORDER — 0.9 % SODIUM CHLORIDE (POUR BTL) OPTIME
TOPICAL | Status: DC | PRN
  Administered 2022-11-30 (×2): 1000 mL

## 2022-11-30 MED ORDER — PROPOFOL 10 MG/ML IV BOLUS
INTRAVENOUS | Status: DC | PRN
  Administered 2022-11-30: 200 mg via INTRAVENOUS

## 2022-11-30 MED ORDER — FLUTICASONE PROPIONATE 50 MCG/ACT NA SUSP
2.0000 | Freq: Every day | NASAL | Status: DC
  Filled 2022-11-30: qty 16

## 2022-11-30 MED ORDER — SENNOSIDES-DOCUSATE SODIUM 8.6-50 MG PO TABS: 1.0000 | ORAL_TABLET | Freq: Every evening | ORAL | Status: DC | PRN

## 2022-11-30 MED ORDER — MONTELUKAST SODIUM 10 MG PO TABS
10.0000 mg | ORAL_TABLET | Freq: Every day | ORAL | Status: DC
  Administered 2022-12-01: 10 mg via ORAL
  Filled 2022-11-30 (×3): qty 1

## 2022-11-30 MED ORDER — HYDROMORPHONE HCL 1 MG/ML IJ SOLN
INTRAMUSCULAR | Status: AC
  Filled 2022-11-30: qty 0.5

## 2022-11-30 MED ORDER — AZELASTINE HCL 0.1 % NA SOLN
2.0000 | Freq: Every morning | NASAL | Status: DC
  Administered 2022-12-01: 2 via NASAL
  Filled 2022-11-30: qty 30

## 2022-11-30 MED ORDER — ONDANSETRON HCL 4 MG/2ML IJ SOLN
INTRAMUSCULAR | Status: DC | PRN
  Administered 2022-11-30: 4 mg via INTRAVENOUS

## 2022-11-30 MED ORDER — CEFAZOLIN SODIUM-DEXTROSE 2-4 GM/100ML-% IV SOLN: 2.0000 g | INTRAVENOUS | Status: DC

## 2022-11-30 MED ORDER — POVIDONE-IODINE 7.5 % EX SOLN
Freq: Once | CUTANEOUS | Status: DC
  Filled 2022-11-30: qty 118

## 2022-11-30 MED ORDER — GABAPENTIN 300 MG PO CAPS
300.0000 mg | ORAL_CAPSULE | Freq: Four times a day (QID) | ORAL | Status: DC
  Administered 2022-11-30 – 2022-12-01 (×3): 300 mg via ORAL
  Filled 2022-11-30 (×3): qty 1

## 2022-11-30 MED ORDER — ALPRAZOLAM 0.5 MG PO TABS: 0.5000 mg | ORAL_TABLET | Freq: Two times a day (BID) | ORAL | Status: DC | PRN

## 2022-11-30 MED ORDER — ONDANSETRON HCL 4 MG/2ML IJ SOLN
INTRAMUSCULAR | Status: AC
  Filled 2022-11-30: qty 2

## 2022-11-30 MED ORDER — HYDROMORPHONE HCL 1 MG/ML IJ SOLN
INTRAMUSCULAR | Status: AC
  Filled 2022-11-30: qty 1

## 2022-11-30 MED ORDER — CEFAZOLIN SODIUM-DEXTROSE 2-3 GM-%(50ML) IV SOLR
INTRAVENOUS | Status: DC | PRN
  Administered 2022-11-30: 2 g via INTRAVENOUS

## 2022-11-30 MED ORDER — METHOCARBAMOL 500 MG PO TABS
500.0000 mg | ORAL_TABLET | Freq: Four times a day (QID) | ORAL | Status: DC | PRN
  Administered 2022-11-30: 500 mg via ORAL
  Administered 2022-12-01: 1000 mg via ORAL
  Filled 2022-11-30: qty 1
  Filled 2022-11-30: qty 2

## 2022-11-30 MED ORDER — PHENYLEPHRINE 80 MCG/ML (10ML) SYRINGE FOR IV PUSH (FOR BLOOD PRESSURE SUPPORT)
PREFILLED_SYRINGE | INTRAVENOUS | Status: AC
  Filled 2022-11-30: qty 10

## 2022-11-30 MED ORDER — TORSEMIDE 20 MG PO TABS: 10.0000 mg | ORAL_TABLET | ORAL | Status: DC

## 2022-11-30 MED ORDER — DIPHENHYDRAMINE HCL 25 MG PO CAPS: 25.0000 mg | ORAL_CAPSULE | Freq: Four times a day (QID) | ORAL | Status: DC | PRN

## 2022-11-30 MED ORDER — KETOROLAC TROMETHAMINE 30 MG/ML IJ SOLN: 30.0000 mg | Freq: Once | INTRAMUSCULAR | Status: DC | PRN

## 2022-11-30 MED ORDER — CYCLOBENZAPRINE HCL 10 MG PO TABS
10.0000 mg | ORAL_TABLET | Freq: Every day | ORAL | Status: DC
  Administered 2022-11-30: 10 mg via ORAL
  Filled 2022-11-30: qty 1

## 2022-11-30 MED ORDER — ATOGEPANT 30 MG PO TABS: 30.0000 mg | ORAL_TABLET | Freq: Every day | ORAL | Status: DC

## 2022-11-30 MED ORDER — ALBUTEROL SULFATE HFA 108 (90 BASE) MCG/ACT IN AERS: 2.0000 | INHALATION_SPRAY | Freq: Four times a day (QID) | RESPIRATORY_TRACT | Status: DC | PRN

## 2022-11-30 MED ORDER — CHLORHEXIDINE GLUCONATE 0.12 % MT SOLN
15.0000 mL | Freq: Once | OROMUCOSAL | Status: AC
  Administered 2022-11-30: 15 mL via OROMUCOSAL
  Filled 2022-11-30: qty 15

## 2022-11-30 MED ORDER — BISACODYL 5 MG PO TBEC: 5.0000 mg | DELAYED_RELEASE_TABLET | Freq: Every day | ORAL | Status: DC | PRN

## 2022-11-30 MED ORDER — FENTANYL CITRATE (PF) 250 MCG/5ML IJ SOLN
INTRAMUSCULAR | Status: DC | PRN
  Administered 2022-11-30 (×2): 50 ug via INTRAVENOUS
  Administered 2022-11-30: 150 ug via INTRAVENOUS

## 2022-11-30 MED ORDER — BUDESON-GLYCOPYRROL-FORMOTEROL 160-9-4.8 MCG/ACT IN AERO: 2.0000 | INHALATION_SPRAY | Freq: Two times a day (BID) | RESPIRATORY_TRACT | Status: DC

## 2022-11-30 MED ORDER — BICTEGRAVIR-EMTRICITAB-TENOFOV 50-200-25 MG PO TABS
1.0000 | ORAL_TABLET | Freq: Every day | ORAL | Status: DC
  Administered 2022-11-30 – 2022-12-01 (×2): 1 via ORAL
  Filled 2022-11-30 (×2): qty 1

## 2022-11-30 MED ORDER — PHENYLEPHRINE 80 MCG/ML (10ML) SYRINGE FOR IV PUSH (FOR BLOOD PRESSURE SUPPORT)
PREFILLED_SYRINGE | INTRAVENOUS | Status: DC | PRN
  Administered 2022-11-30 (×3): 80 ug via INTRAVENOUS
  Administered 2022-11-30: 160 ug via INTRAVENOUS
  Administered 2022-11-30 (×6): 80 ug via INTRAVENOUS

## 2022-11-30 MED ORDER — DEXAMETHASONE SODIUM PHOSPHATE 10 MG/ML IJ SOLN
INTRAMUSCULAR | Status: DC | PRN
  Administered 2022-11-30: 5 mg via INTRAVENOUS

## 2022-11-30 MED ORDER — BUPIVACAINE LIPOSOME 1.3 % IJ SUSP
INTRAMUSCULAR | Status: AC
  Filled 2022-11-30: qty 20

## 2022-11-30 MED ORDER — UMECLIDINIUM BROMIDE 62.5 MCG/ACT IN AEPB
1.0000 | INHALATION_SPRAY | Freq: Every day | RESPIRATORY_TRACT | Status: DC
  Filled 2022-11-30: qty 7

## 2022-11-30 MED ORDER — VANCOMYCIN HCL IN DEXTROSE 1-5 GM/200ML-% IV SOLN
1000.0000 mg | INTRAVENOUS | Status: AC
  Filled 2022-11-30: qty 200

## 2022-11-30 MED ORDER — METOPROLOL SUCCINATE ER 25 MG PO TB24
ORAL_TABLET | ORAL | Status: AC
  Administered 2022-11-30: 100 mg via ORAL
  Filled 2022-11-30: qty 4

## 2022-11-30 MED ORDER — HYDROXYZINE HCL 25 MG PO TABS
50.0000 mg | ORAL_TABLET | Freq: Two times a day (BID) | ORAL | Status: DC
  Administered 2022-11-30 – 2022-12-01 (×2): 50 mg via ORAL
  Filled 2022-11-30 (×2): qty 2

## 2022-11-30 MED ORDER — ROSUVASTATIN CALCIUM 20 MG PO TABS
20.0000 mg | ORAL_TABLET | Freq: Every day | ORAL | Status: DC
  Administered 2022-12-01: 20 mg via ORAL
  Filled 2022-11-30: qty 1

## 2022-11-30 MED ORDER — PROMETHAZINE HCL 25 MG/ML IJ SOLN: 6.2500 mg | INTRAMUSCULAR | Status: DC | PRN

## 2022-11-30 MED ORDER — NEOMYCIN-POLYMYXIN-HC 3.5-10000-1 OT SOLN: 4.0000 [drp] | Freq: Four times a day (QID) | OTIC | Status: DC

## 2022-11-30 MED ORDER — EPINEPHRINE 0.3 MG/0.3ML IJ SOAJ: 0.3000 mg | INTRAMUSCULAR | Status: DC | PRN

## 2022-11-30 MED ORDER — DEXAMETHASONE SODIUM PHOSPHATE 10 MG/ML IJ SOLN
INTRAMUSCULAR | Status: AC
  Filled 2022-11-30: qty 1

## 2022-11-30 MED ORDER — THROMBIN (RECOMBINANT) 20000 UNITS EX SOLR
CUTANEOUS | Status: AC
  Filled 2022-11-30: qty 20000

## 2022-11-30 MED ORDER — ALBUTEROL SULFATE (2.5 MG/3ML) 0.083% IN NEBU: 2.5000 mg | INHALATION_SOLUTION | Freq: Four times a day (QID) | RESPIRATORY_TRACT | Status: DC | PRN

## 2022-11-30 MED ORDER — EPHEDRINE SULFATE-NACL 50-0.9 MG/10ML-% IV SOSY
PREFILLED_SYRINGE | INTRAVENOUS | Status: DC | PRN
  Administered 2022-11-30: 5 mg via INTRAVENOUS

## 2022-11-30 MED ORDER — SODIUM CHLORIDE 0.9% FLUSH
3.0000 mL | Freq: Two times a day (BID) | INTRAVENOUS | Status: DC
  Administered 2022-11-30: 3 mL via INTRAVENOUS

## 2022-11-30 MED ORDER — FLUTICASONE FUROATE-VILANTEROL 100-25 MCG/ACT IN AEPB
1.0000 | INHALATION_SPRAY | Freq: Every day | RESPIRATORY_TRACT | Status: DC
  Filled 2022-11-30: qty 28

## 2022-11-30 MED ORDER — METOPROLOL SUCCINATE ER 25 MG PO TB24
100.0000 mg | ORAL_TABLET | Freq: Every day | ORAL | Status: DC
  Administered 2022-12-01: 100 mg via ORAL
  Filled 2022-11-30: qty 4

## 2022-11-30 MED ORDER — MENTHOL 3 MG MT LOZG: 1.0000 | LOZENGE | OROMUCOSAL | Status: DC | PRN

## 2022-11-30 MED ORDER — MOVE FREE JOINT HEALTH ADVANCE PO TABS: ORAL_TABLET | Freq: Every day | ORAL | Status: DC

## 2022-11-30 MED ORDER — PANTOPRAZOLE SODIUM 40 MG IV SOLR
40.0000 mg | Freq: Every day | INTRAVENOUS | Status: DC
  Administered 2022-11-30: 40 mg via INTRAVENOUS
  Filled 2022-11-30: qty 10

## 2022-11-30 MED ORDER — LIDOCAINE 2% (20 MG/ML) 5 ML SYRINGE
INTRAMUSCULAR | Status: DC | PRN
  Administered 2022-11-30: 100 mg via INTRAVENOUS

## 2022-11-30 MED ORDER — SODIUM CHLORIDE 0.9 % IV SOLN
250.0000 mL | INTRAVENOUS | Status: DC
  Administered 2022-11-30: 250 mL via INTRAVENOUS

## 2022-11-30 SURGICAL SUPPLY — 83 items
APL SKNCLS STERI-STRIP NONHPOA (GAUZE/BANDAGES/DRESSINGS) ×1
BAG COUNTER SPONGE SURGICOUNT (BAG) ×2 IMPLANT
BAG SPNG CNTER NS LX DISP (BAG) ×1
BENZOIN TINCTURE PRP APPL 2/3 (GAUZE/BANDAGES/DRESSINGS) ×4 IMPLANT
BIT DRILL MOUNTAINEER FIX 14 (BIT) ×1
BIT DRILL MOUNTAINEER FIX 14MM (BIT) IMPLANT
BONE FIBERS PLIAFX 5ML (Bone Implant) ×1 IMPLANT
BUR MATCHSTICK NEURO 3.0 LAGG (BURR) ×2 IMPLANT
BUR PRESCISION 1.7 ELITE (BURR) ×2 IMPLANT
CLSR STERI-STRIP ANTIMIC 1/2X4 (GAUZE/BANDAGES/DRESSINGS) IMPLANT
CNTNR URN SCR LID CUP LEK RST (MISCELLANEOUS) ×2 IMPLANT
CONT SPEC 4OZ STRL OR WHT (MISCELLANEOUS) ×1
CORD BIPOLAR FORCEPS 12FT (ELECTRODE) ×2 IMPLANT
COVER BACK TABLE 80X110 HD (DRAPES) ×2 IMPLANT
COVER SURGICAL LIGHT HANDLE (MISCELLANEOUS) ×2 IMPLANT
DRAPE HALF SHEET 40X57 (DRAPES) ×10 IMPLANT
DRAPE INCISE IOBAN 66X45 STRL (DRAPES) ×2 IMPLANT
DRAPE LAPAROTOMY 100X72 PEDS (DRAPES) ×2 IMPLANT
DRAPE POUCH INSTRU U-SHP 10X18 (DRAPES) ×2 IMPLANT
DRAPE SURG 17X23 STRL (DRAPES) ×16 IMPLANT
DRILL BIT MOUNTAINEER FIX 14MM (BIT) ×1
DRSG MEPILEX POST OP 4X8 (GAUZE/BANDAGES/DRESSINGS) ×2 IMPLANT
DURAPREP 26ML APPLICATOR (WOUND CARE) ×2 IMPLANT
ELECT BLADE 4.0 EZ CLEAN MEGAD (MISCELLANEOUS) ×1
ELECT CAUTERY BLADE 6.4 (BLADE) ×2 IMPLANT
ELECT REM PT RETURN 9FT ADLT (ELECTROSURGICAL) ×1
ELECTRODE BLDE 4.0 EZ CLN MEGD (MISCELLANEOUS) ×2 IMPLANT
ELECTRODE REM PT RTRN 9FT ADLT (ELECTROSURGICAL) ×2 IMPLANT
GAUZE 4X4 16PLY ~~LOC~~+RFID DBL (SPONGE) ×8 IMPLANT
GAUZE SPONGE 4X4 12PLY STRL (GAUZE/BANDAGES/DRESSINGS) ×2 IMPLANT
GAUZE SPONGE 4X4 12PLY STRL LF (GAUZE/BANDAGES/DRESSINGS) IMPLANT
GLOVE BIO SURGEON STRL SZ 6.5 (GLOVE) ×2 IMPLANT
GLOVE BIO SURGEON STRL SZ8 (GLOVE) ×2 IMPLANT
GLOVE BIOGEL PI IND STRL 7.5 (GLOVE) ×2 IMPLANT
GLOVE BIOGEL PI IND STRL 8 (GLOVE) ×2 IMPLANT
GLOVE SURG ENC MOIS LTX SZ6.5 (GLOVE) ×2 IMPLANT
GOWN STRL REUS W/ TWL LRG LVL3 (GOWN DISPOSABLE) ×8 IMPLANT
GOWN STRL REUS W/ TWL XL LVL3 (GOWN DISPOSABLE) ×2 IMPLANT
GOWN STRL REUS W/TWL LRG LVL3 (GOWN DISPOSABLE) ×4
GOWN STRL REUS W/TWL XL LVL3 (GOWN DISPOSABLE) ×1
GRAFT BNE FBR PLIAFX PRIME 5 (Bone Implant) IMPLANT
IV CATH 14GX2 1/4 (CATHETERS) ×2 IMPLANT
KIT BASIN OR (CUSTOM PROCEDURE TRAY) ×2 IMPLANT
KIT TURNOVER KIT B (KITS) ×2 IMPLANT
NDL HYPO 22X1.5 SAFETY MO (MISCELLANEOUS) IMPLANT
NDL HYPO 25GX1X1/2 BEV (NEEDLE) ×2 IMPLANT
NDL PRECISIONGLIDE 27X1.5 (NEEDLE) ×2 IMPLANT
NEEDLE HYPO 22X1.5 SAFETY MO (MISCELLANEOUS) ×2 IMPLANT
NEEDLE HYPO 25GX1X1/2 BEV (NEEDLE) ×1 IMPLANT
NEEDLE PRECISIONGLIDE 27X1.5 (NEEDLE) ×1 IMPLANT
NS IRRIG 1000ML POUR BTL (IV SOLUTION) ×2 IMPLANT
PACK LAMINECTOMY ORTHO (CUSTOM PROCEDURE TRAY) ×2 IMPLANT
PACK UNIVERSAL I (CUSTOM PROCEDURE TRAY) ×2 IMPLANT
PAD ARMBOARD 7.5X6 YLW CONV (MISCELLANEOUS) ×4 IMPLANT
PATTIES SURGICAL .5 X.5 (GAUZE/BANDAGES/DRESSINGS) ×2 IMPLANT
PIN MAYFIELD SKULL DISP (PIN) ×2 IMPLANT
PUTTY DBX 2.5CC (Putty) ×1 IMPLANT
PUTTY DBX 2.5CC DEPUY (Putty) IMPLANT
ROD PRELORD SYM 4X70 NS (Rod) IMPLANT
SCREW 4.0 PLY 3.5X14 (Screw) IMPLANT
SCREW 4.0 PLY 3.5X20 SYMPH (Screw) IMPLANT
SCREW PA SYM 3.5X22 (Screw) IMPLANT
SCREW POLY 4X22 (Screw) IMPLANT
SCREW SET SPINAL (Screw) IMPLANT
SPONGE SURGIFOAM ABS GEL 100 (HEMOSTASIS) ×2 IMPLANT
SUT MNCRL AB 4-0 PS2 18 (SUTURE) ×2 IMPLANT
SUT VIC AB 0 CT1 18XCR BRD 8 (SUTURE) ×2 IMPLANT
SUT VIC AB 0 CT1 8-18 (SUTURE) ×1
SUT VIC AB 1 CT1 18XCR BRD 8 (SUTURE) ×4 IMPLANT
SUT VIC AB 1 CT1 8-18 (SUTURE) ×3
SUT VIC AB 2-0 CT2 18 VCP726D (SUTURE) ×2 IMPLANT
SYR 20CC LL (SYRINGE) IMPLANT
SYR BULB IRRIG 60ML STRL (SYRINGE) ×2 IMPLANT
SYR CONTROL 10ML LL (SYRINGE) ×2 IMPLANT
TAP MOUNTAINEER 3MM (TAP) IMPLANT
TAP SURG 3.5 MOUNTAINEER (BIT) IMPLANT
TAPE CLOTH 4X10 WHT NS (GAUZE/BANDAGES/DRESSINGS) ×2 IMPLANT
TAPE CLOTH SURG 4X10 WHT LF (GAUZE/BANDAGES/DRESSINGS) IMPLANT
TOWEL GREEN STERILE (TOWEL DISPOSABLE) ×2 IMPLANT
TOWEL GREEN STERILE FF (TOWEL DISPOSABLE) ×2 IMPLANT
TRAY FOLEY MTR SLVR 16FR STAT (SET/KITS/TRAYS/PACK) ×2 IMPLANT
WATER STERILE IRR 1000ML POUR (IV SOLUTION) ×2 IMPLANT
YANKAUER SUCT BULB TIP NO VENT (SUCTIONS) ×2 IMPLANT

## 2022-11-30 NOTE — Anesthesia Postprocedure Evaluation (Signed)
Anesthesia Post Note  Patient: Casey Brennan  Procedure(s) Performed: POSTERIOR DECOMPRESSION AND FUSION CERVICAL 4- CERVICAL 5, CERVICAL 5- CERVICAL 6, CERVICAL 6- CERVICAL 7, CERVICAL 7- THORACIC 1 WITH INSTRUMENTATION AND ALLOGRAFT     Patient location during evaluation: PACU Anesthesia Type: General Level of consciousness: awake and sedated Pain management: pain level controlled Vital Signs Assessment: post-procedure vital signs reviewed and stable Respiratory status: spontaneous breathing Cardiovascular status: stable Postop Assessment: no apparent nausea or vomiting Anesthetic complications: no  No notable events documented.  Last Vitals:  Vitals:   11/30/22 1630 11/30/22 1652  BP: (!) 148/98 (!) 157/94  Pulse: 78 72  Resp: 11 18  Temp: 36.6 C 36.7 C  SpO2: 93% 92%    Last Pain:  Vitals:   11/30/22 1652  TempSrc: Oral  PainSc: 9                  [name] [place]

## 2022-11-30 NOTE — Anesthesia Procedure Notes (Signed)
Procedure Name: Intubation Date/Time: 11/30/2022 11:32 AM  Performed by: Sharyn Dross, CRNAPre-anesthesia Checklist: Patient identified, Emergency Drugs available, Suction available and Patient being monitored Patient Re-evaluated:Patient Re-evaluated prior to induction Oxygen Delivery Method: Circle system utilized Preoxygenation: Pre-oxygenation with 100% oxygen Induction Type: IV induction Ventilation: Mask ventilation without difficulty and Oral airway inserted - appropriate to patient size Laryngoscope Size: Mac and 4 Grade View: Grade II Tube type: Oral Tube size: 7.5 mm Number of attempts: 1 Airway Equipment and Method: Stylet and Oral airway Placement Confirmation: ETT inserted through vocal cords under direct vision, positive ETCO2 and breath sounds checked- equal and bilateral Secured at: 22 cm Tube secured with: Tape Dental Injury: Teeth and Oropharynx as per pre-operative assessment

## 2022-11-30 NOTE — H&P (Signed)
PREOPERATIVE H&P  Chief Complaint: Neck pain, right arm pain  HPI: Casey Brennan is a 62 y.o. 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, in addition to neuroforaminal stenosis at C6/7 and C7/T1 on the right  Patient has failed multiple forms of conservative care and continues to have pain (see office notes for additional details regarding the patient's full course of treatment)  Past Medical History:  Diagnosis Date   Asthma    very rare   Chronic anemia    normocytic   Chronic folliculitis    Chronic kidney disease    Stage 2   COPD (chronic obstructive pulmonary disease) (HCC)    Dyspnea    Environmental and seasonal allergies    Glaucoma    right   H/O pericarditis    2010--  myopercarditis--  resolved   Headache(784.0)    HX SEVERE FRONTAL HA'S   Hiatal hernia    History of gastric ulcer    nonbleeding gastric ulcer 58//24   History of kidney stones    History of MRSA infection 2010   infected boil   HIV (human immunodeficiency virus infection) (HCC) 1988   Hypertension    Hypertensive cardiomyopathy (HCC)    EF 37%, normal coronaries 04/2020 CCTA; LVEF 60-65% 03/2022 echo   Lytic bone lesion of hip    WORK-UP DONE BY ONCOLOGIST DR HA --  NOT MALIGNANT   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   Pneumonia    hx of   Post concussion syndrome    resolved   Prostate cancer (HCC) 04/25/2013   gleason 3+3=6, volume 30 gm   Viral meningitis    06/2021   Past Surgical History:  Procedure Laterality Date   ACDCF  09/29/2020   ANTERIOR CERVICAL DECOMPRESSION/DISCECTOMY FUSION 4 LEVELS N/A 09/29/2020   Procedure: ANTERIOR CERVICAL DECOMPRESSION FUSION CERVICAL 3-CERVICAL 4 , CERVICAL 4 - CERVICAL 5, CERVICAL 5 - CERVICAL 6 WITH INSTRUMENTATION AND ALLOGRAFT;  Surgeon: Estill Bamberg, MD;  Location: [facility] OR;  Service: Orthopedics;  Laterality: N/A;   CARDIAC CATHETERIZATION  03-23-2009   DR COOPER   NORMAL CORONARY ARTERIES   CHOLECYSTECTOMY N/A 11/06/2014   Procedure: LAPAROSCOPIC CHOLECYSTECTOMY WITH INTRAOPERATIVE CHOLANGIOGRAM;  Surgeon: Jimmye Norman, MD;  Location: [facility] OR;  Service: General;  Laterality: N/A;   COLONOSCOPY  12/26/2011   Procedure: COLONOSCOPY;  Surgeon: Shirley Friar, MD;  Location: [facility] ENDOSCOPY;  Service: Endoscopy;  Laterality: N/A;   COLONOSCOPY     COLONOSCOPY WITH PROPOFOL N/A 07/29/2014   Procedure: COLONOSCOPY WITH PROPOFOL;  Surgeon: Shirley Friar, MD;  Location: [facility] ENDOSCOPY;  Service: Endoscopy;  Laterality: N/A;   CYSTOSCOPY N/A 05/18/2018   Procedure: CYSTOSCOPY REMOVAL FOREIGN BODY;  Surgeon: Ihor Gully, MD;  Location: [facility] ORS;  Service: Urology;  Laterality: N/A;   CYSTOSCOPY/RETROGRADE/URETEROSCOPY Bilateral 04/25/2013   Procedure: CYSTOSCOPY/ BILATERAL RETROGRADES; BLADDER BIOPSIES;  Surgeon: Sebastian Ache, MD;  Location: [facility];  Service: Urology;  Laterality: Bilateral;   DENTAL EXAMINATION UNDER ANESTHESIA     ESOPHAGOGASTRODUODENOSCOPY  12/26/2011   Procedure: ESOPHAGOGASTRODUODENOSCOPY (EGD);  Surgeon: Shirley Friar, MD;  Location: [facility] ENDOSCOPY;  Service: Endoscopy;  Laterality: N/A;   ESOPHAGOGASTRODUODENOSCOPY (EGD) WITH PROPOFOL N/A 07/29/2014   Procedure: ESOPHAGOGASTRODUODENOSCOPY (EGD) WITH PROPOFOL;  Surgeon: Shirley Friar, MD;  Location: [facility] ENDOSCOPY;  Service: Endoscopy;  Laterality: N/A;   EXCISION CHRONIC LEFT BREAST ABSCESS  09-21-2010  EXCISIONAL BX LEFT BREAST MASS/  I  &  D LEFT BREAST ABSCESS  03-24-2009   KNEE ARTHROSCOPY Right 1985   left axilla biopsy  04/2013   left hip biopsy  10/2012   LYMPHADENECTOMY Bilateral 08/18/2013   Procedure: LYMPHADENECTOMY "PELVIC LYMPH NODE DISSECTION";  Surgeon: Sebastian Ache, MD;  Location: [facility] ORS;  Service: Urology;  Laterality: Bilateral;   PROSTATE BIOPSY N/A 04/25/2013   Procedure: BIOPSY TRANSRECTAL ULTRASONIC PROSTATE (TUBP);  Surgeon:  Sebastian Ache, MD;  Location[facility];  Service: Urology;  Laterality: N/A;   ROBOT ASSISTED LAPAROSCOPIC RADICAL PROSTATECTOMY N/A 08/18/2013   Procedure: ROBOTIC ASSISTED LAPAROSCOPIC RADICAL PROSTATECTOMY;  Surgeon: Sebastian Ache, MD;  Location: [facility] ORS;  Service: Urology;  Laterality: N/A;   TOTAL KNEE ARTHROPLASTY Right 08/01/2016   08/17/16   TOTAL KNEE ARTHROPLASTY Right 08/01/2016   Procedure: TOTAL KNEE ARTHROPLASTY;  Surgeon: Sheral Apley, MD;  Location: [facility] OR;  Service: Orthopedics;  Laterality: Right;   UPPER GASTROINTESTINAL ENDOSCOPY     UPPER LEG SOFT TISSUE BIOPSY Left 2012   lthigh   Social History   Socioeconomic History   Marital status: Single    Spouse name: Not on file   Number of children: 1   Years of education: College   Highest education level: Bachelor's degree (e.g., BA, AB, BS)  Occupational History   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 declined condoms  Other Topics Concern   Not on file  Social History Narrative   Sister April stays with him currently although he hopes to return to truck driving. 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 Sister is starting medical school next year.   Patient is divorced and lives with his sister.   Patient has one adult son.   Patient is part-time, driving a truck.   Patient has a college education.   Patient is  right-handed.   Patient drinks 2-3 cups of coffee and a 4-5 cups of tea.   Social Determinants of Health   Financial Resource Strain: Low Risk  (10/03/2022)   Overall Financial Resource Strain (CARDIA)    Difficulty of Paying Living Expenses: Not very hard  Food Insecurity: No Food Insecurity (10/03/2022)   Hunger Vital Sign    Worried About Running Out of Food in the Last Year: Never true    Ran Out of Food in the Last Year: Never true  Transportation Needs: No Transportation Needs (10/03/2022)   PRAPARE - Administrator, Civil Service (Medical): No    Lack of Transportation (Non-Medical): No  Physical Activity: Sufficiently Active (10/03/2022)   Exercise Vital Sign    Days of Exercise per Week: 7 days    Minutes of Exercise per Session: 30 min  Stress: No Stress Concern Present (10/03/2022)   [employer] of Occupational Health - Occupational Stress Questionnaire    Feeling of Stress : Not at all  Social Connections: Unknown (10/03/2022)   Social Connection and Isolation Panel [NHANES]    Frequency of Communication with Friends and Family: More than three times a week    Frequency of Social Gatherings with Friends and Family: More than three times a week    Attends Religious Services: More than 4 times per year    Active Member of [employer] or Organizations: Yes    Attends Engineer, structural: More than 4 times per year    Marital Status: Patient declined   Family History  Problem Relation Age of Onset   Stroke Father    Diabetes Father    Cancer Father        brain cancer   Asthma Father    Hypertension Sister    Cancer Maternal Uncle        prostate cancer   Asthma Sister    Colon cancer Neg Hx    Colon polyps Neg Hx    Esophageal cancer Neg Hx    Stomach cancer Neg Hx    Rectal cancer Neg Hx    Allergies  Allergen Reactions   Amoxapine Shortness Of Breath   Chocolate Shortness Of Breath and Other (See Comments)    Asthma attack, welts    Ciprofloxacin Anaphylaxis, Dermatitis, Itching, Nausea And Vomiting, Shortness Of Breath, Swelling and Hives   Descovy [Emtricitabine-Tenofovir Af] Shortness Of Breath    Nasuea, vomiiting, diarrhea, sob, whelps, throat closes   Doxycycline Hyclate Shortness Of Breath and Swelling   Genvoya [Elviteg-Cobic-Emtricit-Tenofaf] Shortness Of Breath    Nausea, vomitting, diarreha, sob, rash, throat closing   Morphine And Codeine Shortness Of Breath, Itching and Other (See Comments)     chest pain   Other Anaphylaxis   Oxycodone-Acetaminophen Shortness Of Breath   Penicillins Shortness Of Breath and Rash    Has patient had a PCN reaction causing immediate rash, facial/tongue/throat swelling, SOB or lightheadedness with hypotension: Yes Has patient had a PCN reaction causing severe rash involving mucus membranes or skin necrosis: No Has patient had a PCN reaction that required hospitalization No Has patient had a PCN reaction occurring within the last 10 years: Yes If all of the above answers are "NO", then may proceed with Cephalosporin use.   Sulfa Antibiotics Anaphylaxis and Other (See Comments)   Tomato Shortness Of Breath   Vancomycin Shortness Of Breath and Other (See Comments)   Darunavir Itching   Amlodipine Other (See Comments)    swelling   Carvedilol     LEG SWELLING, DIZZNESS   Flagyl [Metronidazole] Itching   Meloxicam    Milk-Related Compounds    Morphine Other (See Comments)   Oxycodone Hcl Other (See Comments)   Penicillin G Other (See Comments)   Toradol [Ketorolac Tromethamine] Nausea And Vomiting    itching   Acetaminophen Rash and Other (See Comments)    Pt tolerates percocet and also plain APAP without rash   Tramadol Itching, Nausea And Vomiting and Other (See Comments)   Prior to Admission medications   Medication Sig Start Date End Date Taking? Authorizing Provider  albuterol (PROVENTIL) (2.5 MG/3ML) 0.083% nebulizer solution Take 3 mLs (2.5 mg total) by  nebulization every 6 (six) hours as needed for wheezing or shortness of breath. 01/25/22  Yes Ginnie Smart, MD  albuterol (VENTOLIN HFA) 108 (90 Base) MCG/ACT inhaler Inhale  2 puffs into the lungs every 6 (six) hours as needed for wheezing or shortness of breath. 10/30/22  Yes Corwin Levins, MD  ALPRAZolam Prudy Feeler) 0.5 MG tablet TAKE 1 TABLET(0.5 MG) BY MOUTH TWICE DAILY AS NEEDED FOR SLEEP Patient taking differently: Take 0.5 mg by mouth 2 (two) times daily as needed for sleep. 11/06/22  Yes Dohmeier, Porfirio Mylar, MD  azelastine (ASTELIN) 0.1 % nasal spray Place 2 sprays into both nostrils in the morning. Use in each nostril as directed   Yes [provider]  BIKTARVY 50-200-25 MG TABS tablet TAKE 1 TABLET BY MOUTH DAILY 08/02/22  Yes Ginnie Smart, MD  BREZTRI AEROSPHERE 160-9-4.8 MCG/ACT AERO INHALE 2 PUFFS INTO THE LUNGS IN THE MORNING AND AT BEDTIME 10/23/22  Yes Oretha Milch, MD  cetirizine (ZYRTEC) 10 MG tablet Take 1 tablet (10 mg total) by mouth daily. 11/11/21 11/23/22 Yes Corwin Levins, MD  Cholecalciferol (VITAMIN D3) 250 MCG (10000 UT) capsule Take 1 tablet by mouth daily. [employer] 04/14/21  Yes [provider]  cyclobenzaprine (FLEXERIL) 10 MG tablet Take 1 tablet (10 mg total) by mouth at bedtime. 11/16/22  Yes Dohmeier, Porfirio Mylar, MD  diphenhydrAMINE (BENADRYL) 25 MG tablet Take 1 tablet (25 mg total) by mouth every 6 (six) hours as needed. Patient taking differently: Take 25 mg by mouth every 6 (six) hours as needed for sleep, itching or allergies. 04/11/21  Yes Venter, Margaux, PA-C  dorzolamide (TRUSOPT) 2 % ophthalmic solution Place 1 drop into both eyes 2 (two) times daily. 06/07/22  Yes [provider]  EPINEPHrine 0.3 mg/0.3 mL IJ SOAJ injection Inject 0.3 mg into the muscle as needed for anaphylaxis. 03/24/22  Yes Corwin Levins, MD  gabapentin (NEURONTIN) 300 MG capsule TAKE 1 CAPSULE(300 MG) BY MOUTH FOUR TIMES DAILY AS NEEDED FOR PAIN Patient taking  differently: Take 300 mg by mouth 4 (four) times daily. 08/02/22  Yes Dohmeier, Porfirio Mylar, MD  Glucos-Chond-Hyal Ac-Ca Fructo (MOVE FREE JOINT HEALTH ADVANCE PO) Take 1 tablet by mouth daily.   Yes [provider]  HYDROcodone-acetaminophen (NORCO/VICODIN) 5-325 MG tablet Take 1 tablet by mouth every 4 (four) hours as needed for moderate pain or severe pain. 06/09/22  Yes [provider]  hydrOXYzine (ATARAX) 50 MG tablet Take 1 tablet (50 mg total) by mouth 2 (two) times daily. 02/08/22  Yes Patwardhan, Anabel Bene, MD  meclizine (ANTIVERT) 25 MG tablet Take 1 tablet (25 mg total) by mouth daily as needed. 11/11/21  Yes Corwin Levins, MD  methocarbamol (ROBAXIN) 500 MG tablet Take 500 mg by mouth 4 (four) times daily.   Yes [provider]  metoprolol succinate (TOPROL-XL) 100 MG 24 hr tablet TAKE 1 TABLET(100 MG) BY MOUTH DAILY WITH OR IMMEDIATELY FOLLOWING A MEAL Patient taking differently: Take 100 mg by mouth daily. 06/07/22  Yes Yates Decamp, MD  montelukast (SINGULAIR) 10 MG tablet Take 10 mg by mouth daily. 11/15/20  Yes [provider]  Multiple Vitamin (MULTIVITAMIN WITH MINERALS) TABS tablet Take 1 tablet by mouth daily after breakfast.    Yes [provider]  mupirocin ointment (BACTROBAN) 2 % APPLY EXTERNALLY TO THE AFFECTED AREA TWICE DAILY FOR 3 WEEKS, PLACE COTTON BETWEEN TOES TO ALLOW BETTER AERATION AS NEEDED Patient taking differently: Apply 1 application  topically daily as needed (affected toes). 04/20/21  Yes Ginnie Smart, MD  nitroGLYCERIN (NITROSTAT) 0.4 MG SL tablet Place 1 tablet (0.4 mg total) under the tongue every 5 (five) minutes  as needed for chest pain. 02/08/22 11/23/22 Yes Patwardhan, Manish J, MD  ondansetron (ZOFRAN-ODT) 4 MG disintegrating tablet Take 1 tablet (4 mg total) by mouth every 8 (eight) hours as needed for nausea or vomiting. Patient taking differently: Take 4 mg by mouth every 4 (four) hours as needed for nausea or  vomiting. 07/17/22  Yes Burns, Bobette Mo, MD  OVER THE COUNTER MEDICATION Take 2 capsules by mouth daily. Zhou: thyroid supplement   Yes [provider]  pantoprazole (PROTONIX) 40 MG tablet Take 1 tablet (40 mg total) by mouth 2 (two) times daily before a meal. 11/08/22  Yes Iva Boop, MD  QNASL 80 MCG/ACT AERS USE 2 SPRAYS IN [facility] NOSTRIL DAILY Patient taking differently: Place 2 sprays into both nostrils in the morning and at bedtime. 11/16/22  Yes Corwin Levins, MD  QULIPTA 30 MG TABS Take 1 tablet (30 mg total) by mouth daily. 11/16/22  Yes Dohmeier, Porfirio Mylar, MD  rosuvastatin (CRESTOR) 20 MG tablet TAKE 1 TABLET(20 MG) BY MOUTH DAILY Patient taking differently: Take 20 mg by mouth daily. 08/28/22  Yes Corwin Levins, MD  tizanidine (ZANAFLEX) 6 MG capsule Take 6 mg by mouth 3 (three) times daily.   Yes [provider]  torsemide (DEMADEX) 20 MG tablet Take 10 mg by mouth See admin instructions. Tuesday Thursday Saturday   Yes [provider]  Ubrogepant (UBRELVY) 100 MG TABS Take 100 mg by mouth See admin instructions. Every 2 hours as needed.   Yes [provider]  neomycin-polymyxin-hydrocortisone (CORTISPORIN) OTIC solution Place 4 drops into the left ear 4 (four) times daily. Patient not taking: Reported on 11/23/2022 09/11/22   Corwin Levins, MD  zolpidem (AMBIEN) 5 MG tablet Take 1-2 tablets (5-10 mg total) by mouth at bedtime as needed for sleep. Patient not taking: Reported on 04/23/2019 12/21/16 05/19/19  Kathlynn Grate, DO     All other systems have been reviewed and were otherwise negative with the exception of those mentioned in the HPI and as above.  Physical Exam: There were no vitals filed for this visit.  There is no height or weight on file to calculate BMI.  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 Plan for Procedure(s): POSTERIOR DECOMPRESSION AND FUSION CERVICAL 4- CERVICAL 5, CERVICAL 5- CERVICAL 6, CERVICAL 6- CERVICAL 7, CERVICAL 7- THORACIC 1 WITH INSTRUMENTATION AND ALLOGRAFT   Jackelyn Hoehn, MD 11/30/2022 7:45 AM

## 2022-11-30 NOTE — Op Note (Signed)
PATIENT NAME: Casey Brennan   MEDICAL RECORD NO.:   [id number]    DATE OF BIRTH: Jun 07, 1961    DATE OF PROCEDURE: 11/30/2022   OPERATIVE REPORT   PREOPERATIVE DIAGNOSES: 1.  Status post previous C3-C6 anterior cervical diskectomy.  2.  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  Right-sided neuroforaminal stenosis at C6-7, C7-T1 4.  Dysphagia  POSTOPERATIVE DIAGNOSES: 1.  Status post previous C3-C6 anterior cervical diskectomy.  2.  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  Right-sided neuroforaminal stenosis at C6-7, C7-T1 4.  Dysphagia  PROCEDURE: 1.  Posterior spinal fusion C4-C5, C5-C6, C6-7, C7-T1 2.  Placement of posterior segmental instrumentation C4-T1 3.  Posterior spinal decompression C6-7, C7-T1 4.  Use of local autograft. 5.  Use of morselized allograft - DBX putty and Pliafix 6.  Cranial tong application and removal. 7.  Intraoperative use of fluoroscopy.  SURGEON:  Estill Bamberg, MD  ASSISTANT:  Jason Coop, PA-C   ANESTHESIA:  General endotracheal anesthesia.  COMPLICATIONS:  None.  DISPOSITION:  Stable.  ESTIMATED BLOOD LOSS:  Minimal.  INDICATIONS FOR SURGERY:  Briefly, Casey Brennan is a pleasant 62 year old male, who did present to me with severe pain in his right arm, very much in the distribution of the C8, and partially, C7 nerves.  He did also have significant weakness in his right hand.  He was noted to have neuroforaminal narrowing at C6-7 and C7-T1 on the right.  He did get good temporary improvement with an epidural injection.  He was also noted to have a nonunion at C5-6.  He was very much interested in surgical intervention.  Given his significant dysphagia, we did discuss that going ahead with an additional anterior procedure would not be ideal, as this would certainly worsen his dysphagia.  We instead discussed proceeding with a posterior decompression and fusion, spanning C4-T1. The patient was fully aware of the risks and limitations of surgery and did wish to proceed.  OPERATIVE  DETAILS:  On 11/30/2022, the patient was brought to surgery and general endotracheal anesthesia was administered.  A Mayfield head holder was applied by me.  The patient was then rolled prone, and the Mayfield headholder was secured to the bed with the head positioned into the appropriate degree of lordosis.  The patient's arms were secured to his sides, and all bony prominences were padded.  The neck was then prepped and draped posteriorly in the usual fashion.  A timeout procedure was  performed.  At this point, I did make a midline incision.  The fascia was incised at the midline.  The paraspinal musculature was bluntly swept laterally, and the posterior elements , C4-T1, were identified and subperiosteally exposed.  The facet joints to be fused were subperiosteally exposed as well.  I did use a 1.7 mm high-speed burr to decorticate the facet joints at C4-C5, C5-C6, C6-7, and C7-T1 bilaterally.  A 3 mm bur was used to decorticate the posterior elements across these levels as well.  Using anatomic landmarks, I did use a 1.7 mm bur to prepare the entry point of the lateral mass screws at C4 and C5.  A 3 mm tap was used, and 3.5 x 14 mm screws were secured into the lateral masses of C4 and C5 bilaterally.  I then proceeded with the decompressive aspect of the procedure.  Using a high-speed bur in addition to a series of Kerrison punches, I did perform a partial facetectomy on the right at C6-7, and C7-T1.  The exiting C7 and C8 nerves were noted, and  were thoroughly decompressed, lateral to the pedicles.  I was very pleased with the decompression that I was able to accomplish.  At this point, I cannulated the C7 and T1 pedicles.  At C7, I did this using anatomic landmarks, and at T1, I did use intraoperative fluoroscopy.  I ultimately placed 3.5 millimeter screws at C7, and 4 mm screws at T1 bilaterally.  At this point, the wound was copiously irrigated.  4 mm rods were secured into the tulip heads bilaterally,  spanning C4-T1.  Caps were placed and a final locking procedure was performed.  I was very pleased with the final construct noted on both AP and lateral fluoroscopic imaging. A combination of DBX putty and Pliafix was then placed along the posterior elements and facet joints to be fused (from C4-T1).  Autograft taken from the spinous processes was also packed posteriorly.  At this point, the wound was closed in layers using #1 Vicryl, followed by 2-0 Vicryl, followed by 4-0 Monocryl.  Benzoin and Steri-Strips  were applied, followed by sterile dressing.  All instrument counts were correct at the termination of the procedure.  The patient was then rolled supine and the Mayfield head holder was removed.  The patient was then awoken from general endotracheal anesthesia uneventfully.  Of note, Jason Coop was my assistant throughout surgery and did aid in retraction, suctioning, and closure from start to finish.  Estill Bamberg, MD

## 2022-11-30 NOTE — Transfer of Care (Signed)
Immediate Anesthesia Transfer of Care Note  Patient: Casey Brennan  Procedure(s) Performed: POSTERIOR DECOMPRESSION AND FUSION CERVICAL 4- CERVICAL 5, CERVICAL 5- CERVICAL 6, CERVICAL 6- CERVICAL 7, CERVICAL 7- THORACIC 1 WITH INSTRUMENTATION AND ALLOGRAFT  Patient Location: PACU  Anesthesia Type:General  Level of Consciousness: drowsy and patient cooperative  Airway & Oxygen Therapy: Patient connected to face mask oxygen  Post-op Assessment: Report given to RN and Post -op Vital signs reviewed and stable  Post vital signs: Reviewed and stable  Last Vitals:  Vitals Value Taken Time  BP 146/96 11/30/22 1533  Temp    Pulse 98 11/30/22 1535  Resp 18 11/30/22 1535  SpO2 100 % 11/30/22 1535  Vitals shown include unvalidated device data.  Last Pain:  Vitals:   11/30/22 0906  TempSrc:   PainSc: 0-No pain         Complications: No notable events documented.

## 2022-12-01 MED ORDER — HYDROMORPHONE HCL 2 MG PO TABS: 2.0000 mg | ORAL_TABLET | ORAL | 0 refills | Status: DC | PRN

## 2022-12-01 MED ORDER — METHOCARBAMOL 500 MG PO TABS: 500.0000 mg | ORAL_TABLET | Freq: Four times a day (QID) | ORAL | 2 refills | Status: AC | PRN

## 2022-12-01 MED ORDER — ADULT MULTIVITAMIN W/MINERALS CH: 1.0000 | ORAL_TABLET | Freq: Every day | ORAL | Status: DC

## 2022-12-01 MED FILL — Thrombin (Recombinant) For Soln 20000 Unit: CUTANEOUS | Qty: 1 | Status: AC

## 2022-12-01 NOTE — Plan of Care (Signed)

## 2022-12-01 NOTE — Evaluation (Signed)
Occupational Therapy Evaluation Patient Details Name: Casey Brennan MRN: [id number] DOB: [birth year] Today's Date: 12/01/2022   History of Present Illness 62 yo male s/p Posterior Decompression fusion C4-T1 PMH CKDII COPD, glaucoma HIV HTN PCS prostate CA viral meningitis   Clinical Impression   Patient is s/p C4- T1 fusion surgery resulting in functional limitations due to the deficits listed below (see OT problem list). Pt with changes to grasp and 4th/ 5th digit claw noted on arrival. Pt lives with sister and indep with adls prior to arrival. Pt currently reports 8/10 pain and RN aware.  Patient will benefit from skilled OT acutely to increase independence and safety with ADLS to allow discharge outpatient hand follow up R hand.       Recommendations for follow up therapy are one component of a multi-disciplinary discharge planning process, led by the attending physician.  Recommendations may be updated based on patient status, additional functional criteria and insurance authorization.   Assistance Recommended at Discharge PRN  Patient can return home with the following A little help with walking and/or transfers;A little help with bathing/dressing/bathroom;Assist for transportation    Functional Status Assessment  Patient has had a recent decline in their functional status and demonstrates the ability to make significant improvements in function in a reasonable and predictable amount of time.  Equipment Recommendations  Other (comment) (RW)    Recommendations for Other Services       Precautions / Restrictions Precautions Precautions: Cervical Precaution Comments: handout provided and reviewed for adls with cevical precautiosn Required Braces or Orthoses: Cervical Brace Cervical Brace: Hard collar      Mobility Bed Mobility Overal bed mobility: Modified Independent                  Transfers Overall transfer level: Modified independent                         Balance                                           ADL either performed or assessed with clinical judgement   ADL Overall ADL's : Needs assistance/impaired Eating/Feeding: Independent   Grooming: Wash/dry hands;Wash/dry face;Oral care;Standing;Supervision/safety               Lower Body Dressing: Min guard;Sit to/from stand   Toilet Transfer: Supervision/safety;Rolling walker (2 wheels)           Functional mobility during ADLs: Supervision/safety;Rolling walker (2 wheels)       Vision Baseline Vision/History: 1 Wears glasses Ability to See in Adequate Light: 0 Adequate Patient Visual Report: No change from baseline       Perception     Praxis      Pertinent Vitals/Pain Pain Assessment Pain Assessment: 0-10 Pain Score: 8  Pain Location: neck Pain Descriptors / Indicators: Discomfort, Grimacing, Operative site guarding, Constant Pain Intervention(s): Monitored during session, Premedicated before session, Repositioned, Limited activity within patient's tolerance     Hand Dominance Right   Extremity/Trunk Assessment Upper Extremity Assessment Upper Extremity Assessment: Generalized weakness;RUE deficits/detail RUE Deficits / Details: noted to have ulnar nerve changes with MCP involvement and clawing of 4th and 5th digits. Recommendation for flow up for hand splint to help wiht MCP alignment and functional grasp to avoid claw contracture. Information provided to family to help with follow up post  op as needed   Lower Extremity Assessment Lower Extremity Assessment: Overall WFL for tasks assessed   Cervical / Trunk Assessment Cervical / Trunk Assessment: Neck Surgery   Communication Communication Communication: No difficulties   Cognition Arousal/Alertness: Awake/alert Behavior During Therapy: Flat affect Overall Cognitive Status: Within Functional Limits for tasks assessed                                        General Comments  dressing dry and intact    Exercises     Shoulder Instructions      Home Living Family/patient expects to be discharged to:: Private residence Living Arrangements: Other relatives Available Help at Discharge: Other (Comment) (sister) Type of Home: House Home Access: Stairs to enter [employer] of Steps: 3 in back 5 in front Entrance Stairs-Rails: Right;Left Home Layout: One level     Bathroom Shower/Tub: Producer, television/film/video: Handicapped height     Home Equipment: Agricultural consultant (2 wheels)   Additional Comments: lives with sister and has rails on the front of the house not [employer] eh back      Prior Functioning/Environment Prior Level of Function : Independent/Modified Independent                        OT Problem List: Impaired balance (sitting and/or standing);Pain      OT Treatment/Interventions: Self-care/ADL training;Therapeutic exercise;Neuromuscular education;Energy conservation;DME and/or AE instruction;Manual therapy;Modalities;Therapeutic activities;Patient/family education;Balance training    OT Goals(Current goals can be found in the care plan section) Acute Rehab OT Goals Patient Stated Goal: pt states "we will see" when mention of d/c orders for today OT Goal Formulation: With patient/family Time For Goal Achievement: 12/15/22 Potential to Achieve Goals: Good  OT Frequency: Min 2X/week    Co-evaluation              AM-PAC OT "6 Clicks" Daily Activity     Outcome Measure Help from another person eating meals?: None Help from another person taking care of personal grooming?: None Help from another person toileting, which includes using toliet, bedpan, or urinal?: A Little Help from another person bathing (including washing, rinsing, drying)?: A Little Help from another person to put on and taking off regular upper body clothing?: None Help from another person to put on and taking off regular lower  body clothing?: A Little 6 Click Score: 21   End of Session Equipment Utilized During Treatment: Rolling walker (2 wheels) Nurse Communication: Mobility status;Precautions  Activity Tolerance: Patient tolerated treatment well Patient left: in bed;with call bell/phone within reach;with family/visitor present  OT Visit Diagnosis: Unsteadiness on feet (R26.81);Muscle weakness (generalized) (M62.81)                Time: 8295-6213 OT Time Calculation (min): 35 min Charges:  OT General Charges $OT Visit: 1 Visit OT Evaluation $OT Eval Moderate Complexity: 1 Mod OT Treatments $Self Care/Home Management : 8-22 mins   Brynn, OTR/L  Acute Rehabilitation Services Office: [phone] .   Mateo Flow 12/01/2022, 1:29 PM

## 2022-12-01 NOTE — Plan of Care (Signed)

## 2022-12-01 NOTE — Progress Notes (Signed)
    Patient doing well  Denies arm pain Tolerating PO well   Physical Exam: Vitals:   12/01/22 0538 12/01/22 0711  BP: (!) 163/90 (!) 173/91  Pulse: 89 91  Resp: 20 18  Temp: (!) 100.4 F (38 C) (!) 101.5 F (38.6 C)  SpO2: 94% 96%    Neck soft/supple Dressing in place NVI  POD #1 s/p cervical decompression and fusion, doing well  - encourage ambulation - Dilaudid for pain, Robaxin for muscle spasms - d/c home today with f/u in 2 weeks  - encourage IS for temp

## 2022-12-01 NOTE — Progress Notes (Signed)
Patient alert and oriented, void, ambulate. Surgical site clean and dry. D/c instructions explain and given to the caregiver all questions answered. Patient d/c home per order.

## 2022-12-02 LAB — GENOSURE PRIME (GSPRIL)

## 2022-12-04 ENCOUNTER — Encounter: Payer: Self-pay | Admitting: *Deleted

## 2022-12-04 ENCOUNTER — Telehealth: Payer: Self-pay | Admitting: *Deleted

## 2022-12-04 NOTE — Transitions of Care (Post Inpatient/ED Visit) (Signed)
12/04/2022  Name: Casey Brennan MRN: [id number] DOB: 12-Jan-1961  Today's TOC FU Call Status: Today's TOC FU Call Status:: Successful TOC FU Call Competed TOC FU Call Complete Date: 12/04/22  Transition Care Management Follow-up Telephone Call Date of Discharge: 12/01/22 Discharge Facility: [facility]) Type of Discharge: Inpatient Admission Primary Inpatient Discharge Diagnosis:: surgical posterior cervical decompression/ fusion- dr. Yevette Edwards How have you been since you were released from the hospital?: Better (per sister/ caregiver: "Overall he is getting better everyday; he initially had some wheezing but that is getting better- he is using his inhalers, the nebulizer, and doing the IS; I am helping him with anything he needs") Any questions or concerns?: Yes Patient Questions/Concerns:: Post- op wheezing: now getting better Patient Questions/Concerns Addressed: Other: (reinforced ongoing consistent use of IS; use of inhalers and nebulizer; importance of gradually increasing mobility)  Items Reviewed: Did you receive and understand the discharge instructions provided?: Yes (thoroughly reviewed with patient's caregiver/ sister who verbalizes good understanding of same) Medications obtained,verified, and reconciled?: Yes (Medications Reviewed) (Full medication reconciliation/ review completed; no concerns or discrepancies identified; confirmed patient obtained/ is taking all newly Rx'd medications as instructed; sister-manages medications and denies questions/ concerns around medications today) Any new allergies since your discharge?: No Dietary orders reviewed?: Yes Type of Diet Ordered:: "As healthy as possible; right now he is eating soft foods after the surgery" Do you have support at home?: Yes People in Home: sibling(s) Name of Support/Comfort Primary Source: Reports independent in self-care activities; supportive sister assists as/ if needed/ indicated; resides with sister  Casey  Medications Reviewed Today: Medications Reviewed Today     Reviewed by Michaela Corner, RN (Registered Nurse) on 12/04/22 at 1312  Med List Status: <None>   Medication Order Taking? Sig Documenting Provider Last Dose Status Informant  albuterol (PROVENTIL) (2.5 MG/3ML) 0.083% nebulizer solution [id number] Yes Take 3 mLs (2.5 mg total) by nebulization every 6 (six) hours as needed for wheezing or shortness of breath. Ginnie Smart, MD Taking Active Family Member, Pharmacy Records  albuterol (VENTOLIN HFA) 108 [phone] Base) MCG/ACT inhaler [id number] Yes Inhale 2 puffs into the lungs every 6 (six) hours as needed for wheezing or shortness of breath. Corwin Levins, MD Taking Active Family Member, Pharmacy Records  ALPRAZolam Prudy Feeler) 0.5 MG tablet [id number] Yes TAKE 1 TABLET(0.5 MG) BY MOUTH TWICE DAILY AS NEEDED FOR SLEEP  Patient taking differently: Take 0.5 mg by mouth 2 (two) times daily as needed for sleep.   Dohmeier, Porfirio Mylar, MD Taking Active Family Member, Pharmacy Records  azelastine (ASTELIN) 0.1 % nasal spray [id number] Yes Place 2 sprays into both nostrils in the morning. Use in each nostril as directed [provider] Taking Active Family Member, Pharmacy Records  BIKTARVY 50-200-25 MG TABS tablet [id number] Yes TAKE 1 TABLET BY MOUTH DAILY [place] Lacretia Leigh, MD Taking Active Family Member, Pharmacy Records  BREZTRI AEROSPHERE 160-9-4.8 MCG/ACT AERO [id number] Yes INHALE 2 PUFFS INTO THE LUNGS IN THE MORNING AND AT BEDTIME Oretha Milch, MD Taking Active Family Member, Pharmacy Records  cetirizine (ZYRTEC) 10 MG tablet [id number]  Take 1 tablet (10 mg total) by mouth daily. Corwin Levins, MD  Expired 11/23/22 2359 Family Member, Pharmacy Records           Med Note Marilu Favre Dec 04, 2022  1:05 PM) 12/04/22: Reports during TOC call continues to take QD  Cholecalciferol (VITAMIN D3) 250 MCG (10000 UT) capsule [id number] No Take  1 tablet by mouth daily. [employer]   Patient not taking: Reported on 12/04/2022   [provider] Not Taking Active Family Member, Pharmacy Records  cyclobenzaprine (FLEXERIL) 10 MG tablet [id number] No Take 1 tablet (10 mg total) by mouth at bedtime.  Patient not taking: Reported on 12/04/2022   Dohmeier, Porfirio Mylar, MD Not Taking Active Family Member, Pharmacy Records  diphenhydrAMINE (BENADRYL) 25 MG tablet [id number] Yes Take 1 tablet (25 mg total) by mouth every 6 (six) hours as needed.  Patient taking differently: Take 25 mg by mouth every 6 (six) hours as needed for sleep, itching or allergies.   Tanda Rockers, PA-C Taking Active Family Member, Pharmacy Records  dorzolamide (TRUSOPT) 2 % ophthalmic solution [id number] Yes Place 1 drop into both eyes 2 (two) times daily. [provider] Taking Active Family Member, Pharmacy Records  EPINEPHrine 0.3 mg/0.3 mL IJ SOAJ injection [id number] Yes Inject 0.3 mg into the muscle as needed for anaphylaxis. Corwin Levins, MD Taking Active Family Member, Pharmacy Records  gabapentin (NEURONTIN) 300 MG capsule [id number] Yes TAKE 1 CAPSULE(300 MG) BY MOUTH FOUR TIMES DAILY AS NEEDED FOR PAIN  Patient taking differently: Take 300 mg by mouth 4 (four) times daily.   Dohmeier, Porfirio Mylar, MD Taking Active Family Member, Pharmacy Records  Glucos-Chond-Hyal Ac-Ca Fructo (MOVE FREE JOINT HEALTH ADVANCE PO) [id number] Yes Take 1 tablet by mouth daily. [provider] Taking Active Family Member, Pharmacy Records  HYDROmorphone (DILAUDID) 2 MG tablet [id number] Yes Take 1 tablet (2 mg total) by mouth every 4 (four) hours as needed for severe pain. Estill Bamberg, MD Taking Active   hydrOXYzine (ATARAX) 50 MG tablet [id number] Yes Take 1 tablet (50 mg total) by mouth 2 (two) times daily. Elder Negus, MD Taking Active Family Member, Pharmacy Records  meclizine (ANTIVERT) 25 MG tablet [id number] Yes Take 1 tablet (25 mg total) by mouth daily as needed. Corwin Levins, MD Taking Active  Family Member, Pharmacy Records  methocarbamol (ROBAXIN) 500 MG tablet [id number] Yes Take 500 mg by mouth 4 (four) times daily. [provider] Taking Active Family Member, Pharmacy Records  methocarbamol (ROBAXIN) 500 MG tablet [id number] Yes Take 1-2 tablets (500-1,000 mg total) by mouth every 6 (six) hours as needed for muscle spasms. Estill Bamberg, MD Taking Active   metoprolol succinate (TOPROL-XL) 100 MG 24 hr tablet [id number] Yes TAKE 1 TABLET(100 MG) BY MOUTH DAILY WITH OR IMMEDIATELY FOLLOWING A MEAL  Patient taking differently: Take 100 mg by mouth daily.   Yates Decamp, MD Taking Active Family Member, Pharmacy Records  montelukast [facility]) 10 MG tablet [id number] Yes Take 10 mg by mouth daily. [provider] Taking Active Family Member, Pharmacy Records  Multiple Vitamin (MULTIVITAMIN WITH MINERALS) TABS tablet [id number] Yes Take 1 tablet by mouth daily after breakfast.  [provider] Taking Active Family Member, Pharmacy Records  mupirocin ointment (BACTROBAN) 2 % [id number] Yes APPLY EXTERNALLY TO THE AFFECTED AREA TWICE DAILY FOR 3 WEEKS, PLACE COTTON BETWEEN TOES TO ALLOW BETTER AERATION AS NEEDED  Patient taking differently: Apply 1 application  topically daily as needed (affected toes).   Ginnie Smart, MD Taking Active Family Member, Pharmacy Records  neomycin-polymyxin-hydrocortisone [facility]) OTIC solution [id number] No Place 4 drops into the left ear 4 (four) times daily.  Patient not taking: Reported on 11/23/2022   Corwin Levins, MD Not Taking Active Family Member, Pharmacy Records           Med Note Berneta Levins, [place]  Mon Dec 04, 2022  1:09 PM) 12/04/22: Reports during TOC call completed course as prescribed- has not needed since course completed  nitroGLYCERIN (NITROSTAT) 0.4 MG SL tablet [id number]  Place 1 tablet (0.4 mg total) under the tongue every 5 (five) minutes as needed for chest pain. Elder Negus, MD  Expired 11/30/22 2359  Family Member, Pharmacy Records           Med Note Marilu Favre Dec 04, 2022  1:09 PM) 12/04/22: Reports during TOC call continues to take prn- reports has not needed recently post-recent surgery; may need new RX  ondansetron (ZOFRAN-ODT) 4 MG disintegrating tablet [id number] Yes Take 1 tablet (4 mg total) by mouth every 8 (eight) hours as needed for nausea or vomiting.  Patient taking differently: Take 4 mg by mouth every 4 (four) hours as needed for nausea or vomiting.   Pincus Sanes, MD Taking Active Family Member, Pharmacy Records  OVER THE COUNTER MEDICATION [id number] Yes Take 2 capsules by mouth daily. Zhou: thyroid supplement [provider] Taking Active Family Member, Pharmacy Records  pantoprazole (PROTONIX) 40 MG tablet [id number] Yes Take 1 tablet (40 mg total) by mouth 2 (two) times daily before a meal. Iva Boop, MD Taking Active Family Member, Pharmacy Records  QNASL 80 MCG/ACT AERS [id number] Yes USE 2 SPRAYS IN [facility] NOSTRIL DAILY  Patient taking differently: Place 2 sprays into both nostrils in the morning and at bedtime.   Corwin Levins, MD Taking Active Family Member, Pharmacy Records  QULIPTA 30 MG TABS [id number] Yes Take 1 tablet (30 mg total) by mouth daily. Dohmeier, Porfirio Mylar, MD Taking Active Family Member, Pharmacy Records  rosuvastatin (CRESTOR) 20 MG tablet [id number] Yes TAKE 1 TABLET(20 MG) BY MOUTH DAILY  Patient taking differently: Take 20 mg by mouth daily.   Corwin Levins, MD Taking Active Family Member, Pharmacy Records  tizanidine (ZANAFLEX) 6 MG capsule [id number] No Take 6 mg by mouth 3 (three) times daily.  Patient not taking: Reported on 12/04/2022   [provider] Not Taking Active Family Member, Pharmacy Records           Med Note Marilu Favre Dec 04, 2022  1:12 PM) 12/04/22: Reports during [facility] call currently not taking after recent surgery on 11/30/22  torsemide (DEMADEX) 20 MG tablet [id number] Yes Take 10 mg by mouth See  admin instructions. Tuesday Thursday Saturday [provider] Taking Active Family Member, Pharmacy Records  Ubrogepant [facility]) 100 MG TABS [id number] Yes Take 100 mg by mouth See admin instructions. Every 2 hours as needed. [provider] Taking Active Family Member, Pharmacy Records   Patient not taking:   Discontinued 05/19/19 0129 Med List Note Waymond Cera, RN 01/16/17 0128): Pt's sister Casey Brennan helps with medications               Home Care and Equipment/Supplies: Were Home Health Services Ordered?: No Any new equipment or medical supplies ordered?: No  Functional Questionnaire: Do you need assistance with bathing/showering or dressing?: Yes (sister provides assistance as needed post-recent surgery) Do you need assistance with meal preparation?: Yes (sister provides assistance as needed post-recent surgery) Do you need assistance with eating?: No Do you have difficulty maintaining continence: No Do you need assistance with getting out of bed/getting out of a chair/moving?: Yes (sister provides assistance as needed post-recent surgery) Do you have difficulty managing or taking your medications?: Yes (sister manages all aspects of  medication administration)  Follow up appointments reviewed: PCP Follow-up appointment confirmed?: Yes (care coordination outreach in real-time with scheduling care guide to successfully schedule hospital follow up PCP appointment 12/15/22- per patient preference; verified recommended time for follow up per discharging provider notes) Date of PCP follow-up appointment?: 12/15/22 Follow-up Provider: PCP, Dr. Jonny Ruiz Specialist [facility] Follow-up appointment confirmed?: Yes Date of Specialist follow-up appointment?: 12/13/22 (verified this is recommended time frame for follow up per hospital discharging provider notes) Follow-Up Specialty Provider:: neurosurgery provider- Dr. Yevette Edwards Do you need transportation to your follow-up  appointment?: No Do you understand care options if your condition(s) worsen?: Yes-patient verbalized understanding  SDOH Interventions Today    Flowsheet Row Most Recent Value  SDOH Interventions   Food Insecurity Interventions Intervention Not Indicated  Transportation Interventions Intervention Not Indicated  [sister provides transportation]      TOC Interventions Today    Flowsheet Row Most Recent Value  TOC Interventions   TOC Interventions Discussed/Reviewed TOC Interventions Discussed  [Sister declines need for ongoing/ further care coordination outreach,  no care coordination needs identified at time of TOC call today,  provided my direct contact information should questions/ concerns/ needs arise post-TOC call,  facilitated PCP OV 6/14]      Interventions Today    Flowsheet Row Most Recent Value  Chronic Disease   Chronic disease during today's visit Other  [posterior cervical decompression/ fusion]  General Interventions   General Interventions Discussed/Reviewed General Interventions Discussed, Doctor Visits, Durable Medical Equipment (DME)  Doctor Visits Discussed/Reviewed Specialist, Doctor Visits Discussed, PCP  Durable Medical Equipment (DME) Val Riles currently using walker as needed]  PCP/Specialist Visits Compliance with follow-up visit  Education Interventions   Education Provided Provided Education  Provided Verbal Education On Other, When to see the doctor  [importance of use of IS post-op]  Nutrition Interventions   Nutrition Discussed/Reviewed Nutrition Discussed  Pharmacy Interventions   Pharmacy Dicussed/Reviewed Pharmacy Topics Discussed  [Full medication review with updating medication list in EHR per patient report]      Caryl Pina, RN, BSN, CCRN Alumnus RN CM Care Coordination/ Transition of Care- [facility] Care Management [phone]: direct office

## 2022-12-06 ENCOUNTER — Emergency Department (HOSPITAL_COMMUNITY): Payer: 59

## 2022-12-06 ENCOUNTER — Other Ambulatory Visit: Payer: Self-pay

## 2022-12-06 ENCOUNTER — Emergency Department (HOSPITAL_COMMUNITY)
Admission: EM | Admit: 2022-12-06 | Discharge: 2022-12-07 | Disposition: A | Discharge status: Home / Self Care | Payer: 59 | Attending: Emergency Medicine | Admitting: Emergency Medicine

## 2022-12-06 ENCOUNTER — Encounter (HOSPITAL_COMMUNITY): Payer: Self-pay

## 2022-12-06 ENCOUNTER — Telehealth: Payer: Self-pay | Admitting: Internal Medicine

## 2022-12-06 DIAGNOSIS — R531 Weakness: Secondary | ICD-10-CM

## 2022-12-06 DIAGNOSIS — Z21 Asymptomatic human immunodeficiency virus [HIV] infection status: Secondary | ICD-10-CM | POA: Insufficient documentation

## 2022-12-06 DIAGNOSIS — J45909 Unspecified asthma, uncomplicated: Secondary | ICD-10-CM | POA: Diagnosis not present

## 2022-12-06 DIAGNOSIS — J449 Chronic obstructive pulmonary disease, unspecified: Secondary | ICD-10-CM | POA: Diagnosis not present

## 2022-12-06 DIAGNOSIS — Z981 Arthrodesis status: Secondary | ICD-10-CM | POA: Diagnosis not present

## 2022-12-06 DIAGNOSIS — Z96651 Presence of right artificial knee joint: Secondary | ICD-10-CM | POA: Insufficient documentation

## 2022-12-06 DIAGNOSIS — Z20822 Contact with and (suspected) exposure to covid-19: Secondary | ICD-10-CM | POA: Diagnosis not present

## 2022-12-06 DIAGNOSIS — Z87891 Personal history of nicotine dependence: Secondary | ICD-10-CM | POA: Diagnosis not present

## 2022-12-06 DIAGNOSIS — R059 Cough, unspecified: Secondary | ICD-10-CM | POA: Diagnosis not present

## 2022-12-06 DIAGNOSIS — R200 Anesthesia of skin: Secondary | ICD-10-CM

## 2022-12-06 DIAGNOSIS — N182 Chronic kidney disease, stage 2 (mild): Secondary | ICD-10-CM | POA: Diagnosis not present

## 2022-12-06 DIAGNOSIS — I129 Hypertensive chronic kidney disease with stage 1 through stage 4 chronic kidney disease, or unspecified chronic kidney disease: Secondary | ICD-10-CM | POA: Diagnosis not present

## 2022-12-06 DIAGNOSIS — Z8546 Personal history of malignant neoplasm of prostate: Secondary | ICD-10-CM | POA: Diagnosis not present

## 2022-12-06 DIAGNOSIS — R202 Paresthesia of skin: Secondary | ICD-10-CM | POA: Diagnosis not present

## 2022-12-06 LAB — COMPREHENSIVE METABOLIC PANEL
ALT: 53 U/L — ABNORMAL HIGH (ref 0–44)
AST: 40 U/L (ref 15–41)
Albumin: 2.9 g/dL — ABNORMAL LOW (ref 3.5–5.0)
Alkaline Phosphatase: 69 U/L (ref 38–126)
Anion gap: 10 (ref 5–15)
BUN: 6 mg/dL (ref 6–20)
CO2: 28 mmol/L (ref 22–32)
Calcium: 8.8 mg/dL — ABNORMAL LOW (ref 8.9–10.3)
Chloride: 95 mmol/L — ABNORMAL LOW (ref 98–111)
Creatinine, Ser: 1.38 mg/dL — ABNORMAL HIGH (ref 0.61–1.24)
GFR, Estimated: 59 mL/min — ABNORMAL LOW (ref >60)
Glucose, Bld: 105 mg/dL — ABNORMAL HIGH (ref 70–99)
Potassium: 3.1 mmol/L — ABNORMAL LOW (ref 3.5–5.1)
Sodium: 133 mmol/L — ABNORMAL LOW (ref 135–145)
Total Bilirubin: 1.1 mg/dL (ref 0.3–1.2)
Total Protein: 7.1 g/dL (ref 6.5–8.1)

## 2022-12-06 LAB — CBC
HCT: 38 % — ABNORMAL LOW (ref 39.0–52.0)
Hemoglobin: 12.6 g/dL — ABNORMAL LOW (ref 13.0–17.0)
MCH: 29.8 pg (ref 26.0–34.0)
MCHC: 33.2 g/dL (ref 30.0–36.0)
MCV: 89.8 fL (ref 80.0–100.0)
Platelets: 292 10*3/uL (ref 150–400)
RBC: 4.23 MIL/uL (ref 4.22–5.81)
RDW: 13.1 % (ref 11.5–15.5)
WBC: 5.8 10*3/uL (ref 4.0–10.5)
nRBC: 0 % (ref 0.0–0.2)

## 2022-12-06 LAB — RESP PANEL BY RT-PCR (RSV, FLU A&B, COVID)  RVPGX2
Influenza A by PCR: NEGATIVE
Influenza B by PCR: NEGATIVE
Resp Syncytial Virus by PCR: NEGATIVE
SARS Coronavirus 2 by RT PCR: NEGATIVE

## 2022-12-06 LAB — CBG MONITORING, ED: Glucose-Capillary: 104 mg/dL — ABNORMAL HIGH (ref 70–99)

## 2022-12-06 MED ORDER — GADOBUTROL 1 MMOL/ML IV SOLN
8.0000 mL | Freq: Once | INTRAVENOUS | Status: AC | PRN
  Administered 2022-12-06: 8 mL via INTRAVENOUS

## 2022-12-06 MED ORDER — HYDROMORPHONE HCL 1 MG/ML IJ SOLN
0.5000 mg | Freq: Once | INTRAMUSCULAR | Status: AC
  Administered 2022-12-06: 0.5 mg via INTRAVENOUS
  Filled 2022-12-06: qty 1

## 2022-12-06 NOTE — ED Provider Notes (Signed)
[place] EMERGENCY DEPARTMENT AT [facility] Provider Note   HPI: Casey Brennan is a 62 year old male with a past medical history as below presenting today with weakness in the left arm and left leg that started this past Monday.  He is notably postop from a cervical fusion with orthopedic surgery on May 30.  He endorses pain over his incision site.  He denies any lower back pain.  He reports decreased sensation with his weakness in the left arm and left leg.  He has not noticed any facial droop.  He has been able to ambulate and has not had any urinary incontinence, fecal incontinence, urinary retention.  He called the surgeon's office who recommended he come in for a possible MRI.  He has been having a cough over the last several days as well.  Past Medical History:  Diagnosis Date   Asthma    very rare   Chronic anemia    normocytic   Chronic folliculitis    Chronic kidney disease    Stage 2   COPD (chronic obstructive pulmonary disease) (HCC)    Dyspnea    Environmental and seasonal allergies    Glaucoma    right   H/O pericarditis    2010--  myopercarditis--  resolved   Headache(784.0)    HX SEVERE FRONTAL HA'S   Hiatal hernia    History of gastric ulcer    nonbleeding gastric ulcer 58//24   History of kidney stones    History of MRSA infection 2010   infected boil   HIV (human immunodeficiency virus infection) (HCC) 1988   Hypertension    Hypertensive cardiomyopathy (HCC)    EF 37%, normal coronaries 04/2020 CCTA; LVEF 60-65% 03/2022 echo   Lytic bone lesion of hip    WORK-UP DONE BY ONCOLOGIST DR HA --  NOT MALIGNANT   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   Pneumonia    hx of   Post concussion syndrome    resolved   Prostate cancer (HCC) 04/25/2013   gleason 3+3=6, volume 30 gm   Viral meningitis    06/2021    Past Surgical History:  Procedure Laterality Date   ACDCF  09/29/2020   ANTERIOR CERVICAL DECOMPRESSION/DISCECTOMY FUSION 4 LEVELS  N/A 09/29/2020   Procedure: ANTERIOR CERVICAL DECOMPRESSION FUSION CERVICAL 3-CERVICAL 4 , CERVICAL 4 - CERVICAL 5, CERVICAL 5 - CERVICAL 6 WITH INSTRUMENTATION AND ALLOGRAFT;  Surgeon: Estill Bamberg, MD;  Location: [facility] OR;  Service: Orthopedics;  Laterality: N/A;   CARDIAC CATHETERIZATION  03-23-2009  DR COOPER   NORMAL CORONARY ARTERIES   CHOLECYSTECTOMY N/A 11/06/2014   Procedure: LAPAROSCOPIC CHOLECYSTECTOMY WITH INTRAOPERATIVE CHOLANGIOGRAM;  Surgeon: Jimmye Norman, MD;  Location: [facility] OR;  Service: General;  Laterality: N/A;   COLONOSCOPY  12/26/2011   Procedure: COLONOSCOPY;  Surgeon: Shirley Friar, MD;  Location: [facility] ENDOSCOPY;  Service: Endoscopy;  Laterality: N/A;   COLONOSCOPY     COLONOSCOPY WITH PROPOFOL N/A 07/29/2014   Procedure: COLONOSCOPY WITH PROPOFOL;  Surgeon: Shirley Friar, MD;  Location: [facility] ENDOSCOPY;  Service: Endoscopy;  Laterality: N/A;   CYSTOSCOPY N/A 05/18/2018   Procedure: CYSTOSCOPY REMOVAL FOREIGN BODY;  Surgeon: Ihor Gully, MD;  Location: [facility] ORS;  Service: Urology;  Laterality: N/A;   CYSTOSCOPY/RETROGRADE/URETEROSCOPY Bilateral 04/25/2013   Procedure: CYSTOSCOPY/ BILATERAL RETROGRADES; BLADDER BIOPSIES;  Surgeon: Sebastian Ache, MD;  Location: [facility];  Service: Urology;  Laterality: Bilateral;   DENTAL EXAMINATION UNDER ANESTHESIA     ESOPHAGOGASTRODUODENOSCOPY  12/26/2011   Procedure: ESOPHAGOGASTRODUODENOSCOPY (EGD);  Surgeon: Shirley Friar, MD;  Location: [facility] ENDOSCOPY;  Service: Endoscopy;  Laterality: N/A;   ESOPHAGOGASTRODUODENOSCOPY (EGD) WITH PROPOFOL N/A 07/29/2014   Procedure: ESOPHAGOGASTRODUODENOSCOPY (EGD) WITH PROPOFOL;  Surgeon: Shirley Friar, MD;  Location: [facility] ENDOSCOPY;  Service: Endoscopy;  Laterality: N/A;   EXCISION CHRONIC LEFT BREAST ABSCESS  09-21-2010   EXCISIONAL BX LEFT BREAST MASS/  I  &  D LEFT BREAST ABSCESS  03-24-2009   KNEE ARTHROSCOPY Right 1985   left axilla biopsy  04/2013   left hip biopsy   10/2012   LYMPHADENECTOMY Bilateral 08/18/2013   Procedure: LYMPHADENECTOMY "PELVIC LYMPH NODE DISSECTION";  Surgeon: Sebastian Ache, MD;  Location: [facility] ORS;  Service: Urology;  Laterality: Bilateral;   PROSTATE BIOPSY N/A 04/25/2013   Procedure: BIOPSY TRANSRECTAL ULTRASONIC PROSTATE (TUBP);  Surgeon: Sebastian Ache, MD;  Location: [facility];  Service: Urology;  Laterality: N/A;   ROBOT ASSISTED LAPAROSCOPIC RADICAL PROSTATECTOMY N/A 08/18/2013   Procedure: ROBOTIC ASSISTED LAPAROSCOPIC RADICAL PROSTATECTOMY;  Surgeon: Sebastian Ache, MD;  Location: [facility] ORS;  Service: Urology;  Laterality: N/A;   TOTAL KNEE ARTHROPLASTY Right 08/01/2016   08/17/16   TOTAL KNEE ARTHROPLASTY Right 08/01/2016   Procedure: TOTAL KNEE ARTHROPLASTY;  Surgeon: Sheral Apley, MD;  Location: [facility] OR;  Service: Orthopedics;  Laterality: Right;   UPPER GASTROINTESTINAL ENDOSCOPY     UPPER LEG SOFT TISSUE BIOPSY Left 2012   lthigh     Social History   Tobacco Use   Smoking status: Former    Packs/day: 1.50    Years: 23.00    Additional pack years: 0.00    Total pack years: 34.50    Types: Cigars, Cigarettes    Quit date: 05/22/2010    Years since quitting: 12.5   Smokeless tobacco: Never  Vaping Use   Vaping Use: Never used  Substance Use Topics   Alcohol use: Not Currently    Alcohol/week: 0.0 standard drinks of alcohol    Comment: 1-2 drinks a week    Drug use: No      Review of Systems  A complete ROS was performed with pertinent positives/negatives noted in the HPI.   Vitals:   12/06/22 1754 12/06/22 2036  BP: 119/87 (!) 128/96  Pulse: 85 87  Resp: 17 17  Temp: 99.7 F (37.6 C) 99.7 F (37.6 C)  SpO2: 91% 95%    Physical Exam Vitals and nursing note reviewed.  Constitutional:      General: He is not in acute distress.    Appearance: He is well-developed. He is not ill-appearing.  HENT:     Head: Normocephalic and atraumatic.  Neck:     Comments: C-collar in  place Cardiovascular:     Rate and Rhythm: Normal rate and regular rhythm.     Pulses: Normal pulses.     Heart sounds: Normal heart sounds. No murmur heard.    No friction rub. No gallop.  Pulmonary:     Effort: Pulmonary effort is normal. No respiratory distress.     Breath sounds: Normal breath sounds. No stridor. No wheezing, rhonchi or rales.  Chest:     Chest wall: No tenderness.  Abdominal:     General: Abdomen is flat. There is no distension.     Palpations: Abdomen is soft.     Tenderness: There is no abdominal tenderness. There is no guarding or rebound.  Musculoskeletal:        General: No swelling.  Skin:  General: Skin is warm and dry.     Capillary Refill: Capillary refill takes less than 2 seconds.  Neurological:     Mental Status: He is alert.     GCS: GCS eye subscore is 4. GCS verbal subscore is 5. GCS motor subscore is 6.     Sensory: Sensory deficit (Decree sensation to left upper extremity and left lower extremity) present.     Motor: Weakness (4/5 strength in left upper extremity with 5/5 in the left lower extremity. 5/5 in right upper and right lower extremity) and pronator drift (LUE) present.     Comments: CRANIAL NERVES: 2 (Optic) - Visual fields intact. PERRL brisk.  3/4/6 (Oculomotor, Trochlear, Abducens) - EOM full without nystagmus 5 (Trigeminal) - sensation intact to light touch, endorses reduced sensation across the left hemiface 7 (Facial) - No facial weakness or asymmetry.  8 (Vestibulococchlear) - responds to voice, auditory acuity grossly normal 9/10 (Glossopharyngeal) - symmetric palate and uvula elevation 11 (Spinal accessory) - head midline, Normal and symmetric sternocleidomastoid and trapezius strength  12 (Hypoglossal) - tongue midline   Psychiatric:        Mood and Affect: Mood normal.     Procedures  MDM:  Imaging/radiology results:  CT Head Wo Contrast  Result Date: 12/06/2022 CLINICAL DATA:  Left upper extremity weakness,  initial encounter EXAM: CT HEAD WITHOUT CONTRAST CT CERVICAL SPINE WITHOUT CONTRAST TECHNIQUE: Multidetector CT imaging of the head and cervical spine was performed following the standard protocol without intravenous contrast. Multiplanar CT image reconstructions of the cervical spine were also generated. RADIATION DOSE REDUCTION: This exam was performed according to the departmental dose-optimization program which includes automated exposure control, adjustment of the mA and/or kV according to patient size and/or use of iterative reconstruction technique. COMPARISON:  06/20/2021 FINDINGS: CT HEAD FINDINGS Brain: No evidence of acute infarction, hemorrhage, hydrocephalus, extra-axial collection or mass lesion/mass effect. Mild atrophic changes are noted. Vascular: No hyperdense vessel or unexpected calcification. Skull: Normal. Negative for fracture or focal lesion. Sinuses/Orbits: No acute finding. Other: None. CT CERVICAL SPINE FINDINGS Alignment: Within normal limits. Skull base and vertebrae: 7 cervical segments are well visualized. Interbody fusion from C3-C6 is noted with anterior fixation. Additionally there are pedicle screws at C4, C5 and C7 and T1. No hardware failure is noted. No acute fracture or acute facet abnormality is noted. Multilevel osteophytic changes are seen. The odontoid is within normal limits. Soft tissues and spinal canal: Surrounding soft tissue structures are within normal limits. No specific disc herniation is noted. Some air is noted in the surgical bed posteriorly consistent with the recent surgery. Upper chest: Visualized lung apices are within normal limits. Other: None IMPRESSION: CT of the head: Mild atrophic changes without acute abnormality. CT of the cervical spine: Multilevel postsurgical and degenerative changes without acute abnormality. Electronically Signed   By: Alcide Clever M.D.   On: 12/06/2022 20:08   CT Cervical Spine Wo Contrast  Result Date: 12/06/2022 CLINICAL  DATA:  Left upper extremity weakness, initial encounter EXAM: CT HEAD WITHOUT CONTRAST CT CERVICAL SPINE WITHOUT CONTRAST TECHNIQUE: Multidetector CT imaging of the head and cervical spine was performed following the standard protocol without intravenous contrast. Multiplanar CT image reconstructions of the cervical spine were also generated. RADIATION DOSE REDUCTION: This exam was performed according to the departmental dose-optimization program which includes automated exposure control, adjustment of the mA and/or kV according to patient size and/or use of iterative reconstruction technique. COMPARISON:  06/20/2021 FINDINGS: CT HEAD FINDINGS Brain:  No evidence of acute infarction, hemorrhage, hydrocephalus, extra-axial collection or mass lesion/mass effect. Mild atrophic changes are noted. Vascular: No hyperdense vessel or unexpected calcification. Skull: Normal. Negative for fracture or focal lesion. Sinuses/Orbits: No acute finding. Other: None. CT CERVICAL SPINE FINDINGS Alignment: Within normal limits. Skull base and vertebrae: 7 cervical segments are well visualized. Interbody fusion from C3-C6 is noted with anterior fixation. Additionally there are pedicle screws at C4, C5 and C7 and T1. No hardware failure is noted. No acute fracture or acute facet abnormality is noted. Multilevel osteophytic changes are seen. The odontoid is within normal limits. Soft tissues and spinal canal: Surrounding soft tissue structures are within normal limits. No specific disc herniation is noted. Some air is noted in the surgical bed posteriorly consistent with the recent surgery. Upper chest: Visualized lung apices are within normal limits. Other: None IMPRESSION: CT of the head: Mild atrophic changes without acute abnormality. CT of the cervical spine: Multilevel postsurgical and degenerative changes without acute abnormality. Electronically Signed   By: Alcide Clever M.D.   On: 12/06/2022 20:08    EKG results: ECG on my  interpretation is normal sinus rhythm and rate, without anatomical ischemia representing STEMI, New-onset Arrhythmia, or ischemic equivalent.     Lab results:  Labs Reviewed  CBC - Abnormal; Notable for the following components:      Result Value   Hemoglobin 12.6 (*)    HCT 38.0 (*)    All other components within normal limits  COMPREHENSIVE METABOLIC PANEL - Abnormal; Notable for the following components:   Sodium 133 (*)    Potassium 3.1 (*)    Chloride 95 (*)    Glucose, Bld 105 (*)    Creatinine, Ser 1.38 (*)    Calcium 8.8 (*)    Albumin 2.9 (*)    ALT 53 (*)    GFR, Estimated 59 (*)    All other components within normal limits  CBG MONITORING, ED - Abnormal; Notable for the following components:   Glucose-Capillary 104 (*)    All other components within normal limits  RESP PANEL BY RT-PCR (RSV, FLU A&B, COVID)  [id number]   Medical decision making: -Vital signs stable. Patient afebrile, hemodynamically stable, and non-toxic appearing. -Patient's presentation is most consistent with acute presentation with potential threat to life or bodily function.. FALCON CUCINELLA is a 62 y.o. male presenting to the emergency department with left-sided weakness after recent surgery.  -Additional history obtained from patient's family member at bedside. -Per chart review, patient had a cervical fusion with Dr. Yevette Edwards on 11/30/2022. -Patient given Dilaudid for pain. -Patient is presenting today due to several days of left-sided weakness since his cervical fusion.  He arrives in a c-collar.  Patient is afebrile and is not endorsing fevers at home.  He does endorse a cough therefore will collect COVID and flu testing as well as chest x-ray.  These are negative and chest x-ray unremarkable.  Patient is endorsing left-sided weakness worse than his left arm.  He also endorses weakness decree sensation over the face.  He is outside of any kind of window for thrombolytics or thrombectomy.  He has had  CT of the head and C-spine ordered in the triaging process which are unremarkable.  Will proceed with MRI of the brain and cervical spine.  MRI of the brain is unremarkable and MRI of the cervical spine is notable for Severe left C5-6 and C6-7 neural foraminal stenosis.  There is no compression on the spinal  cord.  No evidence of postoperative abscess or other complication at this time.  Labs have been obtained and sugars unremarkable.  CBC is without acute hematologic process, no leukocytosis, no anemia.  CMP shows mild hypokalemia and mild hyponatremia with baseline kidney function. ECG on my interpretation is normal sinus rhythm and rate, without anatomical ischemia representing STEMI, New-onset Arrhythmia, or ischemic equivalent.  He has no chest pain or shortness of breath.  Lower suspicion for ACS.  I have discussed the patient with the on-call orthopedics team who are aware he is here.  They report they wanted to rule out a stroke and will be seeing him in several hours for an appointment on Friday.  They believe from his postoperative course he is stable for follow-up with them outpatient.  They do not believe admission is indicated.  I have discussed this plan with the patient who is in agreement.  I discussed strict return precautions.  Return.  Discussed follow-up plan with his orthopedics team and with his PCP.  Patient discharged.   Medical Decision Making Amount and/or Complexity of Data Reviewed Labs: ordered. Decision-making details documented in ED Course. Radiology: ordered. ECG/medicine tests: ordered and independent interpretation performed. Decision-making details documented in ED Course.  Risk Prescription drug management. Decision regarding hospitalization.     The plan for this patient was discussed with Dr. Wallace Cullens, who voiced agreement and who oversaw evaluation and treatment of this patient.  Marta Lamas, MD Emergency Medicine, PGY-3  Note: Dragon medical dictation  software was used in the creation of this note.   Clinical Impression:  1. Weakness   2. Numbness     Rx / DC Orders ED Discharge Orders     None          Chase Caller, MD 12/07/22 0951    Franne Forts, DO 12/19/22 1219

## 2022-12-06 NOTE — ED Triage Notes (Signed)
Pt reports he had a spinal fusion on the 30th of last month, C5-T1. Pt reports he began experiencing weakness to his left arm and left leg on Sunday. Pt currently in a cervical collar. Denies any injury, no loss of control of bowel or bladder. During triage decreased sensation and decreased sensation noted to both left leg and left arm. Pt AxOx4.

## 2022-12-06 NOTE — Telephone Encounter (Signed)
I made patient a visit on Friday with Hetty Blend - 1st opening - daughter would like someone to call her if you think it is necessary - patient's daughter called surgeon office first and they told her to call primary care.  Phone:  [phone]

## 2022-12-07 ENCOUNTER — Encounter (HOSPITAL_COMMUNITY): Payer: Self-pay | Admitting: Emergency Medicine

## 2022-12-07 DIAGNOSIS — R531 Weakness: Secondary | ICD-10-CM | POA: Diagnosis not present

## 2022-12-07 MED ORDER — HYDROMORPHONE HCL 1 MG/ML IJ SOLN
0.5000 mg | Freq: Once | INTRAMUSCULAR | Status: AC
  Administered 2022-12-07: 0.5 mg via INTRAVENOUS
  Filled 2022-12-07: qty 1

## 2022-12-07 NOTE — Discharge Instructions (Addendum)
[facility]:  Thank you for allowing [place] to take care of you today.  We hope you begin feeling better soon.  To-Do: Please follow-up with your spine appointment and with your PCP.  Please return to the Emergency Department or call 911 if you experience chest pain, shortness of breath, severe pain, severe fever, altered mental status, or have any reason to think that you need emergency medical care.  Thank you again.  Hope you feel better soon.  Department of Emergency Medicine [facility]

## 2022-12-07 NOTE — Telephone Encounter (Signed)
MD is out of the office inform pt to keep appt w/ Vickie.Marland KitchenRaechel Chute

## 2022-12-08 ENCOUNTER — Encounter (HOSPITAL_COMMUNITY): Payer: Self-pay | Admitting: Orthopedic Surgery

## 2022-12-08 ENCOUNTER — Ambulatory Visit: Payer: 59 | Admitting: Family Medicine

## 2022-12-08 DIAGNOSIS — Z981 Arthrodesis status: Secondary | ICD-10-CM | POA: Diagnosis not present

## 2022-12-10 NOTE — Discharge Summary (Signed)
Patient ID: Casey Brennan MRN: [id number] DOB/AGE: [birth year] 60 y.o.  Admit date: 11/30/2022 Discharge date: 12/01/2022  Admission Diagnoses:  Principal Problem:   Cervical disc disorder with radiculopathy of cervical region   Discharge Diagnoses:  Same  Past Medical History:  Diagnosis Date   Asthma    very rare   Chronic anemia    normocytic   Chronic folliculitis    Chronic kidney disease    Stage 2   COPD (chronic obstructive pulmonary disease) (HCC)    Dyspnea    Environmental and seasonal allergies    Glaucoma    right   H/O pericarditis    2010--  myopercarditis--  resolved   Headache(784.0)    HX SEVERE FRONTAL HA'S   Hiatal hernia    History of gastric ulcer    nonbleeding gastric ulcer 58//24   History of kidney stones    History of MRSA infection 2010   infected boil   HIV (human immunodeficiency virus infection) (HCC) 1988   Hypertension    Hypertensive cardiomyopathy (HCC)    EF 37%, normal coronaries 04/2020 CCT[facility]; LVEF 60-65% 03/2022 echo   Lytic bone lesion of hip    WORK-UP DONE BY ONCOLOGIST DR HA --  NOT MALIGNANT   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 male who was admitted 11/30/2022 for operative treatment of Cervical disc disorder with radiculopathy of cervical region. Patient has severe unremitting pain that affects sleep, daily activities, and work/hobbies. After pre-op clearance the patient was taken to the operating room on 11/30/2022 and underwent   Procedure(s): POSTERIOR DECOMPRESSION AND FUSION CERVICAL 4- CERVICAL 5, CERVICAL 5- CERVICAL 6, CERVICAL 6- CERVICAL 7, CERVICAL 7- THORACIC 1 WITH INSTRUMENTATION AND ALLOGRAFT.    Patient was given perioperative antibiotics:  Anti-infectives (From admission, onward)    Start     Dose/Rate Route Frequency Ordered Stop   11/30/22 2000  ceFAZolin (ANCEF) IVPB 2g/100 mL premix        2 g 200 mL/hr over 30 Minutes Intravenous Every 8 hours 11/30/22 1646 12/01/22 0425   11/30/22 1800  bictegravir-emtricitabine-tenofovir AF (BIKTARVY) 50-200-25 MG per tablet 1 tablet  Status:  Discontinued        1 tablet Oral Daily 11/30/22 1646 12/01/22 1939   11/30/22 0845  vancomycin (VANCOCIN) IVPB 1000 mg/200 mL premix        1,000 mg 200 mL/hr over 60 Minutes Intravenous On call to O.R. 11/30/22 4098 12/01/22 0559   11/30/22 0830  ceFAZolin (ANCEF) IVPB 2g/100 mL premix  Status:  Discontinued        2 g 200 mL/hr over 30 Minutes Intravenous On call to O.R. 11/30/22 1191 11/30/22 4782        Patient was given sequential compression devices, early ambulation to prevent DVT.  Patient benefited maximally from hospital stay and there were no complications.    Recent vital signs: BP (!) 173/91 (BP Location: Right Arm)   Pulse 91   Temp (!) 101.5 F (  38.6 C) (Oral)   Resp 18   Ht 5\' 7"  (1.702 m)   Wt 92.1 kg   SpO2 96%   BMI 31.79 kg/m    Discharge Medications:   Allergies as of 12/01/2022       Reactions   Amoxapine Shortness Of Breath   Chocolate Shortness Of Breath, Other (See Comments)   Asthma attack, welts   Ciprofloxacin Anaphylaxis, Dermatitis, Itching, Nausea And Vomiting, Shortness Of Breath, Swelling, Hives   Descovy [emtricitabine-tenofovir Af] Shortness Of Breath   Nasuea, vomiiting, diarrhea, sob, whelps, throat closes   Doxycycline Hyclate Shortness Of Breath, Swelling   Genvoya [elviteg-cobic-emtricit-tenofaf] Shortness Of Breath   Nausea, vomitting, diarreha, sob, rash,  throat closing   Morphine And Codeine Shortness Of Breath, Itching, Other (See Comments)    chest pain   Other Anaphylaxis   Oxycodone-acetaminophen Shortness Of Breath   Penicillins Shortness Of Breath, Rash   Has patient had a PCN reaction causing immediate rash, facial/tongue/throat swelling, SOB or lightheadedness with hypotension: Yes Has patient had a PCN reaction causing severe rash involving mucus membranes or skin necrosis: No Has patient had a PCN reaction that required hospitalization No Has patient had a PCN reaction occurring within the last 10 years: Yes If all of the above answers are "NO", then may proceed with Cephalosporin use.   Sulfa Antibiotics Anaphylaxis, Other (See Comments)   Tomato Shortness Of Breath   Vancomycin Shortness Of Breath, Other (See Comments)   Darunavir Itching   Amlodipine Other (See Comments)   swelling   Carvedilol    LEG SWELLING, DIZZNESS   Flagyl [metronidazole] Itching   Meloxicam    Milk-related Compounds    Morphine Other (See Comments)   Oxycodone Hcl Other (See Comments)   Penicillin G Other (See Comments)   Toradol [ketorolac Tromethamine] Nausea And Vomiting   itching   Acetaminophen Rash, Other (See Comments)   Pt tolerates percocet and also plain APAP without rash   Tramadol Itching, Nausea And Vomiting, Other (See Comments)        Medication List     STOP taking these medications    HYDROcodone-acetaminophen 5-325 MG tablet Commonly known as: NORCO/VICODIN       TAKE these medications    albuterol (2.5 MG/3ML) 0.083% nebulizer solution Commonly known as: PROVENTIL Take 3 mLs (2.5 mg total) by nebulization every 6 (six) hours as needed for wheezing or shortness of breath.   albuterol 108 (90 Base) MCG/ACT inhaler Commonly known as: Ventolin HFA Inhale 2 puffs into the lungs every 6 (six) hours as needed for wheezing or shortness of breath.   ALPRAZolam 0.5 MG tablet Commonly known as: XANAX TAKE 1  TABLET(0.5 MG) BY MOUTH TWICE DAILY AS NEEDED FOR SLEEP What changed:  how much to take how to take this when to take this reasons to take this additional instructions   azelastine 0.1 % nasal spray Commonly known as: ASTELIN Place 2 sprays into both nostrils in the morning. Use in each nostril as directed   Biktarvy 50-200-25 MG Tabs tablet Generic drug: bictegravir-emtricitabine-tenofovir AF TAKE 1 TABLET BY MOUTH DAILY   Breztri Aerosphere 160-9-4.8 MCG/ACT Aero Generic drug: Budeson-Glycopyrrol-Formoterol INHALE 2 PUFFS INTO THE LUNGS IN THE MORNING AND AT BEDTIME   cetirizine 10 MG tablet Commonly known as: ZYRTEC Take 1 tablet (10 mg total) by mouth daily.   cyclobenzaprine 10 MG tablet Commonly known as: FLEXERIL Take 1 tablet (10 mg total) by mouth at bedtime.   diphenhydrAMINE 25 MG  tablet Commonly known as: BENADRYL Take 1 tablet (25 mg total) by mouth every 6 (six) hours as needed. What changed: reasons to take this   dorzolamide 2 % ophthalmic solution Commonly known as: TRUSOPT Place 1 drop into both eyes 2 (two) times daily.   EPINEPHrine 0.3 mg/0.3 mL Soaj injection Commonly known as: EPI-PEN Inject 0.3 mg into the muscle as needed for anaphylaxis.   gabapentin 300 MG capsule Commonly known as: NEURONTIN TAKE 1 CAPSULE(300 MG) BY MOUTH FOUR TIMES DAILY AS NEEDED FOR PAIN What changed: See the new instructions.   HYDROmorphone 2 MG tablet Commonly known as: DILAUDID Take 1 tablet (2 mg total) by mouth every 4 (four) hours as needed for severe pain.   hydrOXYzine 50 MG tablet Commonly known as: ATARAX Take 1 tablet (50 mg total) by mouth 2 (two) times daily.   meclizine 25 MG tablet Commonly known as: ANTIVERT Take 1 tablet (25 mg total) by mouth daily as needed.   methocarbamol 500 MG tablet Commonly known as: ROBAXIN Take 500 mg by mouth 4 (four) times daily. What changed: Another medication with the same name was added. Make sure you  understand how and when to take each.   methocarbamol 500 MG tablet Commonly known as: ROBAXIN Take 1-2 tablets (500-1,000 mg total) by mouth every 6 (six) hours as needed for muscle spasms. What changed: You were already taking a medication with the same name, and this prescription was added. Make sure you understand how and when to take each.   metoprolol succinate 100 MG 24 hr tablet Commonly known as: TOPROL-XL TAKE 1 TABLET(100 MG) BY MOUTH DAILY WITH OR IMMEDIATELY FOLLOWING A MEAL What changed: See the new instructions.   montelukast 10 MG tablet Commonly known as: SINGULAIR Take 10 mg by mouth daily.   MOVE FREE JOINT HEALTH ADVANCE PO Take 1 tablet by mouth daily.   multivitamin with minerals Tabs tablet Take 1 tablet by mouth daily after breakfast.   mupirocin ointment 2 % Commonly known as: BACTROBAN APPLY EXTERNALLY TO THE AFFECTED AREA TWICE DAILY FOR 3 WEEKS, PLACE COTTON BETWEEN TOES TO ALLOW BETTER AERATION AS NEEDED What changed: See the new instructions.   neomycin-polymyxin-hydrocortisone OTIC solution Commonly known as: CORTISPORIN Place 4 drops into the left ear 4 (four) times daily.   nitroGLYCERIN 0.4 MG SL tablet Commonly known as: NITROSTAT Place 1 tablet (0.4 mg total) under the tongue every 5 (five) minutes as needed for chest pain.   ondansetron 4 MG disintegrating tablet Commonly known as: ZOFRAN-ODT Take 1 tablet (4 mg total) by mouth every 8 (eight) hours as needed for nausea or vomiting. What changed: when to take this   OVER THE COUNTER MEDICATION Take 2 capsules by mouth daily. Zhou: thyroid supplement   pantoprazole 40 MG tablet Commonly known as: PROTONIX Take 1 tablet (40 mg total) by mouth 2 (two) times daily before a meal.   Qnasl 80 MCG/ACT Aers Generic drug: Beclomethasone Dipropionate USE 2 SPRAYS IN EACH NOSTRIL DAILY What changed: when to take this   Qulipta 30 MG Tabs Generic drug: Atogepant Take 1 tablet (30 mg total)  by mouth daily.   rosuvastatin 20 MG tablet Commonly known as: CRESTOR TAKE 1 TABLET(20 MG) BY MOUTH DAILY What changed: See the new instructions.   tizanidine 6 MG capsule Commonly known as: ZANAFLEX Take 6 mg by mouth 3 (three) times daily.   torsemide 20 MG tablet Commonly known as: DEMADEX Take 10 mg by mouth See admin  instructions. Tuesday Thursday Saturday   Ubrelvy 100 MG Tabs Generic drug: Ubrogepant Take 100 mg by mouth See admin instructions. Every 2 hours as needed.   Vitamin D3 250 MCG (10000 UT) capsule Take 1 tablet by mouth daily. [employer]'s [employer]        Diagnostic Studies: MR Cervical Spine W and Wo Contrast  Result Date: 12/06/2022 CLINICAL DATA:  Left-sided weakness with fever and chills. Cervical fusion EXAM: MRI CERVICAL SPINE WITHOUT AND WITH CONTRAST TECHNIQUE: Multiplanar and multiecho pulse sequences of the cervical spine, to include the craniocervical junction and cervicothoracic junction, were obtained without and with intravenous contrast. CONTRAST:  8mL GADAVIST GADOBUTROL 1 MMOL/ML IV SOLN COMPARISON:  Cervical spine CT 12/06/2022 Cervical spine MRI 10/10/2022 FINDINGS: Alignment: Physiologic. Vertebrae: C3-C6 ACDF. C4-T1 posterior instrumented fusion with transpedicular screws at the C4, C5, C7 and T1 levels. The posterior fusion is new since 10/10/2022. there is no abnormal bone marrow signal. Cord: Normal signal and morphology. Posterior Fossa, vertebral arteries, paraspinal tissues: Dorsal postsurgical changes at C4-T1 with a small amount of fluid in the operative bed but no specific findings of infection. Disc levels: C1-2: Unremarkable. C2-3: Small disc bulge with uncovertebral hypertrophy and mild bilateral facet hypertrophy. There is no spinal canal stenosis. Unchanged mild left neural foraminal stenosis. C3-4: ACDF. There is no spinal canal stenosis. No neural foraminal stenosis. C4-5: ACDF and posterior fusion. There is no spinal canal stenosis. No  neural foraminal stenosis. C5-6: ACDF and posterior fusion. Bilateral uncovertebral hypertrophy. There is no spinal canal stenosis. Mild right and severe left neural foraminal stenosis. C6-7: Intermediate sized disc osteophyte complex. There is no spinal canal stenosis. Moderate right and severe left neural foraminal stenosis. C7-T1: Posterior fusion. There is no spinal canal stenosis. No neural foraminal stenosis. IMPRESSION: 1. Status post C3-C6 ACDF and C4-T1 posterior instrumented fusion. No spinal canal stenosis. 2. Severe left C5-6 and C6-7 neural foraminal stenosis. 3. Moderate right C6-7 neural foraminal stenosis. Electronically Signed   By: Deatra Robinson M.D.   On: 12/06/2022 23:25   MR BRAIN WO CONTRAST  Result Date: 12/06/2022 CLINICAL DATA:  Left facial numbness and left body weakness EXAM: MRI HEAD WITHOUT CONTRAST TECHNIQUE: Multiplanar, multiecho pulse sequences of the brain and surrounding structures were obtained without intravenous contrast. COMPARISON:  10/08/2022 FINDINGS: Brain: No acute infarct, mass effect or extra-axial collection. No acute or chronic hemorrhage. Normal white matter signal, parenchymal volume and CSF spaces. The midline structures are normal. Vascular: Major flow voids are preserved. Skull and upper cervical spine: Normal calvarium and skull base. Visualized upper cervical spine and soft tissues are normal. Sinuses/Orbits:No paranasal sinus fluid levels or advanced mucosal thickening. No mastoid or middle ear effusion. Normal orbits. IMPRESSION: Normal brain MRI. Electronically Signed   By: Deatra Robinson M.D.   On: 12/06/2022 23:15   DG Chest Portable 1 View  Result Date: 12/06/2022 CLINICAL DATA:  Cough EXAM: PORTABLE CHEST 1 VIEW COMPARISON:  11/10/2022 FINDINGS: The heart size and mediastinal contours are within normal limits. Both lungs are clear. The visualized skeletal structures are unremarkable. IMPRESSION: Normal study. Electronically Signed   By: Charlett Nose  M.D.   On: 12/06/2022 21:50   CT Head Wo Contrast  Result Date: 12/06/2022 CLINICAL DATA:  Left upper extremity weakness, initial encounter EXAM: CT HEAD WITHOUT CONTRAST CT CERVICAL SPINE WITHOUT CONTRAST TECHNIQUE: Multidetector CT imaging of the head and cervical spine was performed following the standard protocol without intravenous contrast. Multiplanar CT image reconstructions of the cervical spine  were also generated. RADIATION DOSE REDUCTION: This exam was performed according to the departmental dose-optimization program which includes automated exposure control, adjustment of the mA and/or kV according to patient size and/or use of iterative reconstruction technique. COMPARISON:  06/20/2021 FINDINGS: CT HEAD FINDINGS Brain: No evidence of acute infarction, hemorrhage, hydrocephalus, extra-axial collection or mass lesion/mass effect. Mild atrophic changes are noted. Vascular: No hyperdense vessel or unexpected calcification. Skull: Normal. Negative for fracture or focal lesion. Sinuses/Orbits: No acute finding. Other: None. CT CERVICAL SPINE FINDINGS Alignment: Within normal limits. Skull base and vertebrae: 7 cervical segments are well visualized. Interbody fusion from C3-C6 is noted with anterior fixation. Additionally there are pedicle screws at C4, C5 and C7 and T1. No hardware failure is noted. No acute fracture or acute facet abnormality is noted. Multilevel osteophytic changes are seen. The odontoid is within normal limits. Soft tissues and spinal canal: Surrounding soft tissue structures are within normal limits. No specific disc herniation is noted. Some air is noted in the surgical bed posteriorly consistent with the recent surgery. Upper chest: Visualized lung apices are within normal limits. Other: None IMPRESSION: CT of the head: Mild atrophic changes without acute abnormality. CT of the cervical spine: Multilevel postsurgical and degenerative changes without acute abnormality. Electronically  Signed   By: Alcide Clever M.D.   On: 12/06/2022 20:08   CT Cervical Spine Wo Contrast  Result Date: 12/06/2022 CLINICAL DATA:  Left upper extremity weakness, initial encounter EXAM: CT HEAD WITHOUT CONTRAST CT CERVICAL SPINE WITHOUT CONTRAST TECHNIQUE: Multidetector CT imaging of the head and cervical spine was performed following the standard protocol without intravenous contrast. Multiplanar CT image reconstructions of the cervical spine were also generated. RADIATION DOSE REDUCTION: This exam was performed according to the departmental dose-optimization program which includes automated exposure control, adjustment of the mA and/or kV according to patient size and/or use of iterative reconstruction technique. COMPARISON:  06/20/2021 FINDINGS: CT HEAD FINDINGS Brain: No evidence of acute infarction, hemorrhage, hydrocephalus, extra-axial collection or mass lesion/mass effect. Mild atrophic changes are noted. Vascular: No hyperdense vessel or unexpected calcification. Skull: Normal. Negative for fracture or focal lesion. Sinuses/Orbits: No acute finding. Other: None. CT CERVICAL SPINE FINDINGS Alignment: Within normal limits. Skull base and vertebrae: 7 cervical segments are well visualized. Interbody fusion from C3-C6 is noted with anterior fixation. Additionally there are pedicle screws at C4, C5 and C7 and T1. No hardware failure is noted. No acute fracture or acute facet abnormality is noted. Multilevel osteophytic changes are seen. The odontoid is within normal limits. Soft tissues and spinal canal: Surrounding soft tissue structures are within normal limits. No specific disc herniation is noted. Some air is noted in the surgical bed posteriorly consistent with the recent surgery. Upper chest: Visualized lung apices are within normal limits. Other: None IMPRESSION: CT of the head: Mild atrophic changes without acute abnormality. CT of the cervical spine: Multilevel postsurgical and degenerative changes  without acute abnormality. Electronically Signed   By: Alcide Clever M.D.   On: 12/06/2022 20:08   DG Cervical Spine 2 or 3 views  Result Date: 11/30/2022 CLINICAL DATA:  Fluoroscopic assistance for lumbar spine surgery EXAM: CERVICAL SPINE - 2-3 VIEW COMPARISON:  None Available. FINDINGS: Fluoroscopic images show interval posterior fusion from C4-T1 levels. There is previous anterior fusion from C3-C6 levels. Radiation dose 21.5 seconds. IMPRESSION: Fluoroscopic assistance was provided for posterior surgical fusion from C4-T1 levels. Electronically Signed   By: Ernie Avena M.D.   On: 11/30/2022 17:23  DG Cervical Spine 1 View  Result Date: 11/30/2022 CLINICAL DATA:  Intraoperative imaging localization EXAM: DG CERVICAL SPINE - 1 VIEW COMPARISON:  None Available. FINDINGS: Cross-table lateral view of cervical spine shows anterior surgical fusion from C3-C6 levels. Surgical probe is noted with its tip superimposed over the posterior elements at C5-C6 level. IMPRESSION: Tip of localizing probe is noted overlying the posterior elements at C5-C6 level. There is anterior surgical fusion from C3-C6 levels. Electronically Signed   By: Ernie Avena M.D.   On: 11/30/2022 17:21   DG C-Arm 1-60 Min-No Report  Result Date: 11/30/2022 Fluoroscopy was utilized by the requesting physician.  No radiographic interpretation.   DG C-Arm 1-60 Min-No Report  Result Date: 11/30/2022 Fluoroscopy was utilized by the requesting physician.  No radiographic interpretation.   DG C-Arm 1-60 Min-No Report  Result Date: 11/30/2022 Fluoroscopy was utilized by the requesting physician.  No radiographic interpretation.   DG Chest 2 View  Result Date: 11/16/2022 CLINICAL DATA:  Cough/wheezing EXAM: CHEST - 2 VIEW COMPARISON:  Chest x-ray July 28, 2022 FINDINGS: The cardiomediastinal silhouette is unchanged in contour. No focal pulmonary opacity. No pleural effusion or pneumothorax. The visualized upper  abdomen is unremarkable. No acute osseous abnormality. IMPRESSION: No active cardiopulmonary disease. Electronically Signed   By: Jacob Moores M.D.   On: 11/16/2022 08:18    Disposition: Discharge disposition: 01-Home or Self Care        POD #1 s/p cervical decompression and fusion, doing well   - encourage ambulation - Dilaudid for pain, Robaxin for muscle spasms -Scripts for pain sent to pharmacy electronically  -D/C instructions sheet printed and in chart -D/C today  -F/U in office 2 weeks   Signed: Eilene Ghazi Tasean Mancha 12/10/2022, 9:52 AM

## 2022-12-12 ENCOUNTER — Other Ambulatory Visit: Payer: Self-pay | Admitting: Pulmonary Disease

## 2022-12-13 ENCOUNTER — Telehealth: Payer: Self-pay | Admitting: *Deleted

## 2022-12-13 DIAGNOSIS — M7542 Impingement syndrome of left shoulder: Secondary | ICD-10-CM | POA: Diagnosis not present

## 2022-12-15 ENCOUNTER — Ambulatory Visit (INDEPENDENT_AMBULATORY_CARE_PROVIDER_SITE_OTHER): Payer: 59 | Admitting: Internal Medicine

## 2022-12-15 VITALS — BP 110/78 | HR 67 | Temp 98.4°F | Ht 69.0 in | Wt 199.0 lb

## 2022-12-15 DIAGNOSIS — I10 Essential (primary) hypertension: Secondary | ICD-10-CM | POA: Diagnosis not present

## 2022-12-15 DIAGNOSIS — M25512 Pain in left shoulder: Secondary | ICD-10-CM | POA: Diagnosis not present

## 2022-12-15 DIAGNOSIS — M5010 Cervical disc disorder with radiculopathy, unspecified cervical region: Secondary | ICD-10-CM | POA: Diagnosis not present

## 2022-12-15 DIAGNOSIS — E559 Vitamin D deficiency, unspecified: Secondary | ICD-10-CM | POA: Diagnosis not present

## 2022-12-15 NOTE — Patient Instructions (Addendum)
Please continue the prednisone as prescribed  Please continue all other medications as before, and refills have been done if requested.  Please have the pharmacy call with any other refills you may need.  Please keep your appointments with your specialists as you may have planned  No further lab work needed today  Please make an Appointment to return in Aug 14, or sooner if needed

## 2022-12-15 NOTE — Progress Notes (Unsigned)
Patient ID: Casey Brennan, male   DOB: February 25, 1961, 62 y.o.   MRN: [id number]        Chief Complaint: follow up left shoulder reduced ROM, left eye redness and swelling, htn, low vit d       HPI:  Casey Brennan is a 61 y.o. male here with wife concerned about multiple left sided issues wondering if there is some reason for this; pt did have left eye redness and periorbital swelling now resolved with antibx doing well.  Also has persistent left shoulder pain with known rot cuff tearing per wife, has surgury planned for sept 2024.  Pt denies chest pain, increased sob or doe, wheezing, orthopnea, PND, increased LE swelling, palpitations, dizziness or syncope.   Pt denies polydipsia, polyuria, or new focal neuro s/s.    Pt denies fever, wt loss, night sweats, loss of appetite, or other constitutional symptoms  Also has incidentally 7/18 EGD colonoscopy.  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 Was seen at ED with MRI brain and c spine without significant abnormality, now on prednisone trial for possible swelling post op cervical surgury.         Wt Readings from Last 3 Encounters:  12/15/22 199 lb (90.3 kg)  12/06/22 200 lb (90.7 kg)  11/30/22 203 lb (92.1 kg)   BP Readings from Last 3 Encounters:  12/15/22 110/78  12/06/22 116/88  12/01/22 (!) 173/91         Past Medical History:  Diagnosis Date   Asthma    very rare   Chronic anemia    normocytic   Chronic folliculitis    Chronic kidney disease    Stage 2   COPD (chronic obstructive pulmonary disease) (HCC)    Dyspnea    Environmental and seasonal allergies    Glaucoma    right   H/O pericarditis    2010--  myopercarditis--  resolved   Headache(784.0)    HX SEVERE FRONTAL HA'S   Hiatal hernia    History of gastric ulcer    nonbleeding gastric ulcer 58//24   History of kidney stones    History of MRSA infection 2010   infected boil   HIV  (human immunodeficiency virus infection) (HCC) 1988   Hypertension    Hypertensive cardiomyopathy (HCC)    EF 37%, normal coronaries 04/2020 CCTA; LVEF 60-65% 03/2022 echo   Lytic bone lesion of hip    WORK-UP DONE BY ONCOLOGIST DR HA --  NOT MALIGNANT   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   Pneumonia    hx of   Post concussion syndrome    resolved   Prostate cancer (HCC) 04/25/2013   gleason 3+3=6, volume 30 gm   Viral meningitis    06/2021   Past Surgical History:  Procedure Laterality Date   ACDCF  09/29/2020   ANTERIOR CERVICAL DECOMPRESSION/DISCECTOMY FUSION 4 LEVELS N/A 09/29/2020   Procedure: ANTERIOR CERVICAL DECOMPRESSION FUSION CERVICAL 3-CERVICAL 4 , CERVICAL 4 - CERVICAL 5, CERVICAL 5 - CERVICAL 6 WITH INSTRUMENTATION AND ALLOGRAFT;  Surgeon: Estill Bamberg, MD;  Location: [facility];  Service: Orthopedics;  Laterality: N/A;   CARDIAC CATHETERIZATION  03-23-2009  DR COOPER   NORMAL CORONARY ARTERIES   CHOLECYSTECTOMY N/A 11/06/2014   Procedure: LAPAROSCOPIC CHOLECYSTECTOMY WITH INTRAOPERATIVE CHOLANGIOGRAM;  Surgeon: Jimmye Norman, MD;  Location: [facility] OR;  Service: General;  Laterality: N/A;   COLONOSCOPY  12/26/2011  Procedure: COLONOSCOPY;  Surgeon: Shirley Friar, MD;  Location: [facility];  Service: Endoscopy;  Laterality: N/A;   COLONOSCOPY     COLONOSCOPY WITH PROPOFOL N/A 07/29/2014   Procedure: COLONOSCOPY WITH PROPOFOL;  Surgeon: Shirley Friar, MD;  Location: [facility] ENDOSCOPY;  Service: Endoscopy;  Laterality: N/A;   CYSTOSCOPY N/A 05/18/2018   Procedure: CYSTOSCOPY REMOVAL FOREIGN BODY;  Surgeon: Ihor Gully, MD;  Location: [facility]S;  Service: Urology;  Laterality: N/A;   CYSTOSCOPY/RETROGRADE/URETEROSCOPY Bilateral 04/25/2013   Procedure: CYSTOSCOPY/ BILATERAL RETROGRADES; BLADDER BIOPSIES;  Surgeon: Sebastian Ache, MD;  Location: [facility];  Service: Urology;  Laterality: Bilateral;   DENTAL EXAMINATION UNDER ANESTHESIA      ESOPHAGOGASTRODUODENOSCOPY  12/26/2011   Procedure: ESOPHAGOGASTRODUODENOSCOPY (EGD);  Surgeon: Shirley Friar, MD;  Location: [facility];  Service: Endoscopy;  Laterality: N/A;   ESOPHAGOGASTRODUODENOSCOPY (EGD) WITH PROPOFOL N/A 07/29/2014   Procedure: ESOPHAGOGASTRODUODENOSCOPY (EGD) WITH PROPOFOL;  Surgeon: Shirley Friar, MD;  Location: [facility] ENDOSCOPY;  Service: Endoscopy;  Laterality: N/A;   EXCISION CHRONIC LEFT BREAST ABSCESS  09-21-2010   EXCISIONAL BX LEFT BREAST MASS/  I  &  D LEFT BREAST ABSCESS  03-24-2009   KNEE ARTHROSCOPY Right 1985   left axilla biopsy  04/2013   left hip biopsy  10/2012   LYMPHADENECTOMY Bilateral 08/18/2013   Procedure: LYMPHADENECTOMY "PELVIC LYMPH NODE DISSECTION";  Surgeon: Sebastian Ache, MD;  Location: [facility];  Service: Urology;  Laterality: Bilateral;   POSTERIOR CERVICAL FUSION/FORAMINOTOMY N/A 11/30/2022   Procedure: POSTERIOR DECOMPRESSION AND FUSION CERVICAL 4- CERVICAL 5, CERVICAL 5- CERVICAL 6, CERVICAL 6- CERVICAL 7, CERVICAL 7- THORACIC 1 WITH INSTRUMENTATION AND ALLOGRAFT;  Surgeon: Estill Bamberg, MD;  Location: [facility];  Service: Orthopedics;  Laterality: N/A;   PROSTATE BIOPSY N/A 04/25/2013   Procedure: BIOPSY TRANSRECTAL ULTRASONIC PROSTATE (TUBP);  Surgeon: Sebastian Ache, MD;  Location: [facility];  Service: Urology;  Laterality: N/A;   ROBOT ASSISTED LAPAROSCOPIC RADICAL PROSTATECTOMY N/A 08/18/2013   Procedure: ROBOTIC ASSISTED LAPAROSCOPIC RADICAL PROSTATECTOMY;  Surgeon: Sebastian Ache, MD;  Location: [facility];  Service: Urology;  Laterality: N/A;   TOTAL KNEE ARTHROPLASTY Right 08/01/2016   08/17/16   TOTAL KNEE ARTHROPLASTY Right 08/01/2016   Procedure: TOTAL KNEE ARTHROPLASTY;  Surgeon: Sheral Apley, MD;  Location: [facility] OR;  Service: Orthopedics;  Laterality: Right;   UPPER GASTROINTESTINAL ENDOSCOPY     UPPER LEG SOFT TISSUE BIOPSY Left 2012   lthigh    reports that he quit smoking about 12 years ago. His  smoking use included cigars and cigarettes. He has a 34.50 pack-year smoking history. He has never used smokeless tobacco. He reports that he does not currently use alcohol. He reports that he does not use drugs. family history includes Asthma in his father and sister; Cancer in his father and maternal uncle; Diabetes in his father; Hypertension in his sister; Stroke in his father. Allergies  Allergen Reactions   Amoxapine Shortness Of Breath   Chocolate Shortness Of Breath and Other (See Comments)    Asthma attack, welts   Ciprofloxacin Anaphylaxis, Dermatitis, Itching, Nausea And Vomiting, Shortness Of Breath, Swelling and Hives   Descovy [Emtricitabine-Tenofovir Af] Shortness Of Breath    Nasuea, vomiiting, diarrhea, sob, whelps, throat closes   Doxycycline Hyclate Shortness Of Breath and Swelling   Genvoya [Elviteg-Cobic-Emtricit-Tenofaf] Shortness Of Breath    Nausea, vomitting, diarreha, sob, rash, throat closing   Morphine And Codeine Shortness Of Breath, Itching and Other (See Comments)  chest pain   Other Anaphylaxis   Oxycodone-Acetaminophen Shortness Of Breath   Penicillins Shortness Of Breath and Rash    Has patient had a PCN reaction causing immediate rash, facial/tongue/throat swelling, SOB or lightheadedness with hypotension: Yes Has patient had a PCN reaction causing severe rash involving mucus membranes or skin necrosis: No Has patient had a PCN reaction that required hospitalization No Has patient had a PCN reaction occurring within the last 10 years: Yes If all of the above answers are "NO", then may proceed with Cephalosporin use.   Sulfa Antibiotics Anaphylaxis and Other (See Comments)   Tomato Shortness Of Breath   Vancomycin Shortness Of Breath and Other (See Comments)   Darunavir Itching   Amlodipine Other (See Comments)    swelling   Carvedilol     LEG SWELLING, DIZZNESS   Flagyl [Metronidazole] Itching   Meloxicam    Milk-Related Compounds    Morphine  Other (See Comments)   Oxycodone Hcl Other (See Comments)   Penicillin G Other (See Comments)   Toradol [Ketorolac Tromethamine] Nausea And Vomiting    itching   Acetaminophen Rash and Other (See Comments)    Pt tolerates percocet and also plain APAP without rash   Tramadol Itching, Nausea And Vomiting and Other (See Comments)   Current Outpatient Medications on File Prior to Visit  Medication Sig Dispense Refill   albuterol (PROVENTIL) (2.5 MG/3ML) 0.083% nebulizer solution Take 3 mLs (2.5 mg total) by nebulization every 6 (six) hours as needed for wheezing or shortness of breath. 75 mL 3   albuterol (VENTOLIN HFA) 108 (90 Base) MCG/ACT inhaler Inhale 2 puffs into the lungs every 6 (six) hours as needed for wheezing or shortness of breath. 36 g 3   ALPRAZolam (XANAX) 0.5 MG tablet TAKE 1 TABLET(0.5 MG) BY MOUTH TWICE DAILY AS NEEDED FOR SLEEP (Patient taking differently: Take 0.5 mg by mouth 2 (two) times daily as needed for sleep.) 60 tablet 0   azelastine (ASTELIN) 0.1 % nasal spray Place 2 sprays into both nostrils in the morning. Use in each nostril as directed     BIKTARVY 50-200-25 MG TABS tablet TAKE 1 TABLET BY MOUTH DAILY 30 tablet 11   Budeson-Glycopyrrol-Formoterol (BREZTRI AEROSPHERE) 160-9-4.8 MCG/ACT AERO INHALE 2 PUFFS INTO THE LUNGS IN THE MORNING AND AT BEDTIME 10.7 g 3   diphenhydrAMINE (BENADRYL) 25 MG tablet Take 1 tablet (25 mg total) by mouth every 6 (six) hours as needed. (Patient taking differently: Take 25 mg by mouth every 6 (six) hours as needed for sleep, itching or allergies.) 30 tablet 0   dorzolamide (TRUSOPT) 2 % ophthalmic solution Place 1 drop into both eyes 2 (two) times daily.     EPINEPHrine 0.3 mg/0.3 mL IJ SOAJ injection Inject 0.3 mg into the muscle as needed for anaphylaxis. 1 each 5   gabapentin (NEURONTIN) 300 MG capsule TAKE 1 CAPSULE(300 MG) BY MOUTH FOUR TIMES DAILY AS NEEDED FOR PAIN (Patient taking differently: Take 300 mg by mouth 4 (four) times  daily.) 120 capsule 0   Glucos-Chond-Hyal Ac-Ca Fructo (MOVE FREE JOINT HEALTH ADVANCE PO) Take 1 tablet by mouth daily.     HYDROmorphone (DILAUDID) 2 MG tablet Take 1 tablet (2 mg total) by mouth every 4 (four) hours as needed for severe pain. 30 tablet 0   hydrOXYzine (ATARAX) 50 MG tablet Take 1 tablet (50 mg total) by mouth 2 (two) times daily. 60 tablet 1   meclizine (ANTIVERT) 25 MG tablet Take 1 tablet (25  mg total) by mouth daily as needed. 40 tablet 5   methocarbamol (ROBAXIN) 500 MG tablet Take 1-2 tablets (500-1,000 mg total) by mouth every 6 (six) hours as needed for muscle spasms. 30 tablet 2   metoprolol succinate (TOPROL-XL) 100 MG 24 hr tablet TAKE 1 TABLET(100 MG) BY MOUTH DAILY WITH OR IMMEDIATELY FOLLOWING A MEAL (Patient taking differently: Take 100 mg by mouth daily.) 90 tablet 3   montelukast (SINGULAIR) 10 MG tablet Take 10 mg by mouth daily.     Multiple Vitamin (MULTIVITAMIN WITH MINERALS) TABS tablet Take 1 tablet by mouth daily after breakfast.      mupirocin ointment (BACTROBAN) 2 % APPLY EXTERNALLY TO THE AFFECTED AREA TWICE DAILY FOR 3 WEEKS, PLACE COTTON BETWEEN TOES TO ALLOW BETTER AERATION AS NEEDED (Patient taking differently: Apply 1 application  topically daily as needed (affected toes).) 66 g 1   ondansetron (ZOFRAN-ODT) 4 MG disintegrating tablet Take 1 tablet (4 mg total) by mouth every 8 (eight) hours as needed for nausea or vomiting. (Patient taking differently: Take 4 mg by mouth every 4 (four) hours as needed for nausea or vomiting.) 30 tablet 2   OVER THE COUNTER MEDICATION Take 2 capsules by mouth daily. Zhou: thyroid supplement     pantoprazole (PROTONIX) 40 MG tablet Take 1 tablet (40 mg total) by mouth 2 (two) times daily before a meal. 180 tablet 3   QNASL 80 MCG/ACT AERS USE 2 SPRAYS IN EACH NOSTRIL DAILY (Patient taking differently: Place 2 sprays into both nostrils in the morning and at bedtime.) 10.6 g 2   QULIPTA 30 MG TABS Take 1 tablet (30 mg  total) by mouth daily. 30 tablet 5   rosuvastatin (CRESTOR) 20 MG tablet TAKE 1 TABLET(20 MG) BY MOUTH DAILY (Patient taking differently: Take 20 mg by mouth daily.) 90 tablet 3   torsemide (DEMADEX) 20 MG tablet Take 10 mg by mouth See admin instructions. Tuesday Thursday Saturday     Ubrogepant (UBRELVY) 100 MG TABS Take 100 mg by mouth See admin instructions. Every 2 hours as needed.     cetirizine (ZYRTEC) 10 MG tablet Take 1 tablet (10 mg total) by mouth daily. 90 tablet 3   Cholecalciferol (VITAMIN D3) 250 MCG (10000 UT) capsule Take 1 tablet by mouth daily. [employer] (Patient not taking: Reported on 12/04/2022)     cyclobenzaprine (FLEXERIL) 10 MG tablet Take 1 tablet (10 mg total) by mouth at bedtime. (Patient not taking: Reported on 12/04/2022) 90 tablet 3   methocarbamol (ROBAXIN) 500 MG tablet Take 500 mg by mouth 4 (four) times daily. (Patient not taking: Reported on 12/15/2022)     nitroGLYCERIN (NITROSTAT) 0.4 MG SL tablet Place 1 tablet (0.4 mg total) under the tongue every 5 (five) minutes as needed for chest pain. 30 tablet 1   tizanidine (ZANAFLEX) 6 MG capsule Take 6 mg by mouth 3 (three) times daily. (Patient not taking: Reported on 12/04/2022)     [DISCONTINUED] zolpidem (AMBIEN) 5 MG tablet Take 1-2 tablets (5-10 mg total) by mouth at bedtime as needed for sleep. (Patient not taking: Reported on 04/23/2019) 30 tablet 2   No current facility-administered medications on file prior to visit.        ROS:  All others reviewed and negative.  Objective        PE:  BP 110/78 (BP Location: Right Arm, Patient Position: Sitting, Cuff Size: Normal)   Pulse 67   Temp 98.4 F (36.9 C) (Oral)   Ht  5\' 9"  (1.753 m)   Wt 199 lb (90.3 kg)   SpO2 94%   BMI 29.39 kg/m                 Constitutional: Pt appears in NAD               HENT: Head: NCAT.                Right Ear: External ear normal.                 Left Ear: External ear normal.                Eyes: . Pupils are equal,  round, and reactive to light. Conjunctivae and EOM are normal, no redness or swelling periorbital               Nose: without d/c or deformity               Neck: Neck supple. Gross normal ROM               Cardiovascular: Normal rate and regular rhythm.                 Pulmonary/Chest: Effort normal and breath sounds without rales or wheezing.                Abd:  Soft, NT, ND, + BS, no organomegaly               Neurological: Pt is alert. At baseline orientation, motor grossly intact               Skin: Skin is warm. No rashes, no other new lesions, LE edema - none               Psychiatric: Pt behavior is normal without agitation   Micro: none  Cardiac tracings I have personally interpreted today:  none  Pertinent Radiological findings (summarize): none   Lab Results  Component Value Date   WBC 5.8 12/06/2022   HGB 12.6 (L) 12/06/2022   HCT 38.0 (L) 12/06/2022   PLT 292 12/06/2022   GLUCOSE 105 (H) 12/06/2022   CHOL 139 08/14/2022   TRIG 101.0 08/14/2022   HDL 39.10 08/14/2022   LDLCALC 80 08/14/2022   ALT 53 (H) 12/06/2022   AST 40 12/06/2022   NA 133 (L) 12/06/2022   K 3.1 (L) 12/06/2022   CL 95 (L) 12/06/2022   CREATININE 1.38 (H) 12/06/2022   BUN 6 12/06/2022   CO2 28 12/06/2022   TSH 2.51 08/14/2022   PSA 0.00 (L) 08/14/2022   INR 1.0 06/23/2021   HGBA1C <4.2 08/15/2022   MICROALBUR 16.88 (H) 10/05/2011   Assessment/Plan:  Casey Brennan is a 62 y.o. 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  Essential hypertension BP Readings from Last 3 Encounters:  12/15/22 110/78  12/06/22 116/88  12/01/22 (!) 173/91   Stable, pt to continue medical  treatment toprol xl 100 qd   Vitamin D deficiency Last vitamin D Lab Results  Component Value Date   VD25OH 27.12 (L) 08/14/2022   Low, to start oral replacement   Left shoulder pain With persistent pain and rotater cuff issue - for surgury sep 2024 as planned  Cervical disc disorder with radiculopathy of cervical region Pt enocuraged to finish prednisone asd  Followup: Return in about 2 months (around 02/14/2023).  Oliver Barre, MD 12/16/2022 6:50 PM [facility] Internal Medicine

## 2022-12-16 ENCOUNTER — Encounter: Payer: Self-pay | Admitting: Internal Medicine

## 2022-12-16 DIAGNOSIS — M25512 Pain in left shoulder: Secondary | ICD-10-CM | POA: Insufficient documentation

## 2022-12-16 NOTE — Assessment & Plan Note (Signed)
Last vitamin D Lab Results  Component Value Date   VD25OH 27.12 (L) 08/14/2022   Low, to start oral replacement  

## 2022-12-16 NOTE — Assessment & Plan Note (Signed)
BP Readings from Last 3 Encounters:  12/15/22 110/78  12/06/22 116/88  12/01/22 (!) 173/91   Stable, pt to continue medical treatment toprol xl 100 qd

## 2022-12-16 NOTE — Assessment & Plan Note (Signed)
Pt enocuraged to finish prednisone asd

## 2022-12-16 NOTE — Assessment & Plan Note (Signed)
With persistent pain and rotater cuff issue - for surgury sep 2024 as planned

## 2022-12-20 ENCOUNTER — Telehealth: Payer: Self-pay | Admitting: Internal Medicine

## 2022-12-20 ENCOUNTER — Other Ambulatory Visit: Payer: Self-pay

## 2022-12-20 DIAGNOSIS — J449 Chronic obstructive pulmonary disease, unspecified: Secondary | ICD-10-CM

## 2022-12-20 MED ORDER — ALBUTEROL SULFATE HFA 108 (90 BASE) MCG/ACT IN AERS
2.0000 | INHALATION_SPRAY | Freq: Four times a day (QID) | RESPIRATORY_TRACT | 3 refills | Status: DC | PRN
Start: 2022-12-20 — End: 2022-12-28

## 2022-12-20 MED ORDER — UBRELVY 100 MG PO TABS: 100.0000 mg | ORAL_TABLET | ORAL | 0 refills | Status: DC

## 2022-12-20 NOTE — Telephone Encounter (Signed)
Refill sent.

## 2022-12-20 NOTE — Telephone Encounter (Signed)
Prescription Request  12/20/2022  LOV: 12/15/2022  What is the name of the medication or equipment? Ventolin inhaler, Bernita Raisin,   Have you contacted your pharmacy to request a refill? Yes   Which pharmacy would you like this sent to?  [id number] - [place], [place] - [address] AT [facility] OF [address] [place] [address] Phone: [phone] Fax: [phone]    Patient notified that their request is being sent to the clinical staff for review and that they should receive a response within 2 business days.   Please advise at Mobile [phone] (mobile)

## 2022-12-21 ENCOUNTER — Other Ambulatory Visit: Payer: Self-pay

## 2022-12-21 DIAGNOSIS — G43109 Migraine with aura, not intractable, without status migrainosus: Secondary | ICD-10-CM

## 2022-12-21 MED ORDER — UBRELVY 100 MG PO TABS
100.0000 mg | ORAL_TABLET | ORAL | 0 refills | Status: DC
Start: 2022-12-21 — End: 2023-02-26

## 2022-12-25 ENCOUNTER — Telehealth: Payer: Self-pay | Admitting: Pharmacy Technician

## 2022-12-25 NOTE — Telephone Encounter (Signed)
Pharmacy Patient Advocate Encounter   Received notification from CoverMyMeds that prior authorization for Ubrelvy 100MG  tablets is required/requested.    PA submitted to [facility] via CoverMyMeds Key/confirmation #/EOC [id number] Status is pending

## 2022-12-26 NOTE — Telephone Encounter (Signed)
UBRELVY TAB 100MG  (use as directed: 30 tablets per 30 days) is denied for not meeting the quantity limit requirement. Quantities at or below your plan's limit 16 tablets per 30 days will continue to process at the pharmacy for you. Coverage for the requested quantity requires the following: One of the following: (1) The higher dose or quantity is supported in the dosage and administration section of the manufacturer's prescribing information. (2) The higher dose or quantity is supported by one of the following Medicare approved compendia: (A) [facility] Formulary Service Drug Information. (B) [employer] DRUGDEX Information System.

## 2022-12-28 ENCOUNTER — Telehealth: Payer: Self-pay | Admitting: Internal Medicine

## 2022-12-28 DIAGNOSIS — J449 Chronic obstructive pulmonary disease, unspecified: Secondary | ICD-10-CM

## 2022-12-28 MED ORDER — ALBUTEROL SULFATE HFA 108 (90 BASE) MCG/ACT IN AERS
2.0000 | INHALATION_SPRAY | Freq: Four times a day (QID) | RESPIRATORY_TRACT | 3 refills | Status: DC | PRN
Start: 2022-12-28 — End: 2023-05-09

## 2022-12-28 NOTE — Telephone Encounter (Signed)
Resent rx to [facility]../lmb 

## 2022-12-28 NOTE — Telephone Encounter (Signed)
Prescription Request  12/28/2022  LOV: 12/15/2022  What is the name of the medication or equipment? Ventolin HFA inhaler  Have you contacted your pharmacy to request a refill? Yes   Which pharmacy would you like this sent to?  [facility] #[id number] - [place], [place] - [address] AT [facility] OF [address] [place] [address] Phone: [phone] Fax: [phone]    Patient notified that their request is being sent to the clinical staff for review and that they should receive a response within 2 business days.   Please advise at Mobile [phone] (mobile)

## 2022-12-31 ENCOUNTER — Other Ambulatory Visit: Payer: Self-pay | Admitting: Neurology

## 2022-12-31 ENCOUNTER — Other Ambulatory Visit: Payer: Self-pay | Admitting: Cardiology

## 2022-12-31 DIAGNOSIS — R252 Cramp and spasm: Secondary | ICD-10-CM

## 2022-12-31 DIAGNOSIS — G44229 Chronic tension-type headache, not intractable: Secondary | ICD-10-CM

## 2022-12-31 DIAGNOSIS — B20 Human immunodeficiency virus [HIV] disease: Secondary | ICD-10-CM

## 2022-12-31 DIAGNOSIS — G250 Essential tremor: Secondary | ICD-10-CM

## 2023-01-01 ENCOUNTER — Other Ambulatory Visit: Payer: Self-pay | Admitting: Anesthesiology

## 2023-01-01 DIAGNOSIS — G4700 Insomnia, unspecified: Secondary | ICD-10-CM

## 2023-01-01 MED ORDER — ALPRAZOLAM 0.5 MG PO TABS: ORAL_TABLET | ORAL | 5 refills | Status: DC

## 2023-01-02 ENCOUNTER — Encounter: Payer: Self-pay | Admitting: Internal Medicine

## 2023-01-05 DIAGNOSIS — M5412 Radiculopathy, cervical region: Secondary | ICD-10-CM | POA: Diagnosis not present

## 2023-01-09 DIAGNOSIS — N281 Cyst of kidney, acquired: Secondary | ICD-10-CM | POA: Diagnosis not present

## 2023-01-09 DIAGNOSIS — R310 Gross hematuria: Secondary | ICD-10-CM | POA: Diagnosis not present

## 2023-01-09 DIAGNOSIS — N3000 Acute cystitis without hematuria: Secondary | ICD-10-CM | POA: Diagnosis not present

## 2023-01-17 ENCOUNTER — Encounter: Payer: Self-pay | Admitting: Certified Registered Nurse Anesthetist

## 2023-01-18 ENCOUNTER — Ambulatory Visit (AMBULATORY_SURGERY_CENTER): Payer: 59 | Admitting: Internal Medicine

## 2023-01-18 ENCOUNTER — Encounter: Payer: Self-pay | Admitting: Internal Medicine

## 2023-01-18 VITALS — BP 127/82 | HR 80 | Temp 100.2°F | Resp 18 | Ht 67.0 in | Wt 203.0 lb

## 2023-01-18 DIAGNOSIS — R933 Abnormal findings on diagnostic imaging of other parts of digestive tract: Secondary | ICD-10-CM | POA: Diagnosis not present

## 2023-01-18 DIAGNOSIS — K5281 Eosinophilic gastritis or gastroenteritis: Secondary | ICD-10-CM

## 2023-01-18 DIAGNOSIS — D123 Benign neoplasm of transverse colon: Secondary | ICD-10-CM

## 2023-01-18 DIAGNOSIS — K253 Acute gastric ulcer without hemorrhage or perforation: Secondary | ICD-10-CM | POA: Diagnosis not present

## 2023-01-18 DIAGNOSIS — J449 Chronic obstructive pulmonary disease, unspecified: Secondary | ICD-10-CM | POA: Diagnosis not present

## 2023-01-18 DIAGNOSIS — K219 Gastro-esophageal reflux disease without esophagitis: Secondary | ICD-10-CM | POA: Diagnosis not present

## 2023-01-18 DIAGNOSIS — K319 Disease of stomach and duodenum, unspecified: Secondary | ICD-10-CM | POA: Diagnosis not present

## 2023-01-18 DIAGNOSIS — K200 Eosinophilic esophagitis: Secondary | ICD-10-CM

## 2023-01-18 MED ORDER — SODIUM CHLORIDE 0.9 % IV SOLN
500.0000 mL | INTRAVENOUS | Status: DC
Start: 2023-01-18 — End: 2023-01-18

## 2023-01-18 NOTE — Patient Instructions (Addendum)
Handout on polyps, hemorrhoids, and diverticulosis given to patient Await pathology results Resume previous diet and continue present medications including the pantoprazole 40 mg twice a day and avoid NSAID's (naproxen, ibuprofen, naproxen, etc.) Repeat colonoscopy for surveillance will be determined based off of pathology results    YOU HAD AN ENDOSCOPIC PROCEDURE TODAY AT THE [facility]:   Refer to the procedure report that was given to you for any specific questions about what was found during the examination.  If the procedure report does not answer your questions, please call your gastroenterologist to clarify.  If you requested that your care partner not be given the details of your procedure findings, then the procedure report has been included in a sealed envelope for you to review at your convenience later.  YOU SHOULD EXPECT: Some feelings of bloating in the abdomen. Passage of more gas than usual.  Walking can help get rid of the air that was put into your GI tract during the procedure and reduce the bloating. If you had a lower endoscopy (such as a colonoscopy or flexible sigmoidoscopy) you may notice spotting of blood in your stool or on the toilet paper. If you underwent a bowel prep for your procedure, you may not have a normal bowel movement for a few days.  Please Note:  You might notice some irritation and congestion in your nose or some drainage.  This is from the oxygen used during your procedure.  There is no need for concern and it should clear up in a day or so.  SYMPTOMS TO REPORT IMMEDIATELY:  Following lower endoscopy (colonoscopy or flexible sigmoidoscopy):  Excessive amounts of blood in the stool  Significant tenderness or worsening of abdominal pains  Swelling of the abdomen that is new, acute  Fever of 100F or higher  Following upper endoscopy (EGD)  Vomiting of blood or coffee ground material  New chest pain or pain under the shoulder  blades  Painful or persistently difficult swallowing  New shortness of breath  Fever of 100F or higher  Black, tarry-looking stools  For urgent or emergent issues, a gastroenterologist can be reached at any hour by calling [phone]. Do not use MyChart messaging for urgent concerns.    DIET:  We do recommend a small meal at first, but then you may proceed to your regular diet.  Drink plenty of fluids but you should avoid alcoholic beverages for 24 hours.  ACTIVITY:  You should plan to take it easy for the rest of today and you should NOT DRIVE or use heavy machinery until tomorrow (because of the sedation medicines used during the test).    FOLLOW UP: Our staff will call the number listed on your records the next business day following your procedure.  We will call around 7:15- 8:00 am to check on you and address any questions or concerns that you may have regarding the information given to you following your procedure. If we do not reach you, we will leave a message.     If any biopsies were taken you will be contacted by phone or by letter within the next 1-3 weeks.  Please call us at [phone] if you have not heard about the biopsies in 3 weeks.    SIGNATURES/CONFIDENTIALITY: You and/or your care partner have signed paperwork which will be entered into your electronic medical record.  These signatures attest to the fact that that the information above on your After Visit Summary has been reviewed and  is understood.  Full responsibility of the confidentiality of this discharge information lies with you and/or your care-partner.

## 2023-01-18 NOTE — Progress Notes (Signed)
1522 Robinul 0.1 mg IV given due large amount of secretions upon assessment.  MD made aware, vss

## 2023-01-18 NOTE — Op Note (Signed)
[facility] Patient Name: Casey Brennan Procedure Date: 01/18/2023 3:24 PM MRN: [id number] Endoscopist: Beverley Fiedler , MD, [id number] Age: 62 Referring MD:  Date of Birth: October 10, 1960 Gender: Male Account #: [id number] Procedure:                Colonoscopy Indications:              Abnormal CT of the GI tract (rectal thickening), hx                            of rectal ulcer in 2020 bx consistent with prolapse Medicines:                Monitored Anesthesia Care Procedure:                Pre-Anesthesia Assessment:                           - Prior to the procedure, a History and Physical                            was performed, and patient medications and                            allergies were reviewed. The patient's tolerance of                            previous anesthesia was also reviewed. The risks                            and benefits of the procedure and the sedation                            options and risks were discussed with the patient.                            All questions were answered, and informed consent                            was obtained. Prior Anticoagulants: The patient has                            taken no anticoagulant or antiplatelet agents. ASA                            Grade Assessment: III - A patient with severe                            systemic disease. After reviewing the risks and                            benefits, the patient was deemed in satisfactory                            condition to undergo the procedure.  After obtaining informed consent, the colonoscope                            was passed under direct vision. Throughout the                            procedure, the patient's blood pressure, pulse, and                            oxygen saturations were monitored continuously. The                            Olympus Scope SN: [id number] was introduced through                            the  anus and advanced to the cecum, identified by                            appendiceal orifice and ileocecal valve. The                            colonoscopy was performed without difficulty. The                            patient tolerated the procedure well. The quality                            of the bowel preparation was excellent. The                            ileocecal valve, appendiceal orifice, and rectum                            were photographed. Scope In: 3:41:53 PM Scope Out: 3:52:49 PM Scope Withdrawal Time: 0 hours 8 minutes 48 seconds  Total Procedure Duration: 0 hours 10 minutes 56 seconds  Findings:                 The digital rectal exam was normal.                           A 2 mm polyp was found in the transverse colon. The                            polyp was sessile. The polyp was removed with a                            cold snare. Resection and retrieval were complete.                           Multiple small-mouthed diverticula were found in                            the sigmoid colon and descending colon.  A medium scar was found in the rectum.                           Internal hemorrhoids were found during                            retroflexion. The hemorrhoids were small.                           The exam was otherwise without abnormality. Complications:            No immediate complications. Estimated Blood Loss:     Estimated blood loss: none. Impression:               - One 2 mm polyp in the transverse colon, removed                            with a cold snare. Resected and retrieved.                           - Mild diverticulosis in the sigmoid colon and in                            the descending colon.                           - Scar in the rectum without mucosal abnormalities.                           - Small internal hemorrhoids.                           - The examination was otherwise normal. Recommendation:            - Patient has a contact number available for                            emergencies. The signs and symptoms of potential                            delayed complications were discussed with the                            patient. Return to normal activities tomorrow.                            Written discharge instructions were provided to the                            patient.                           - Resume previous diet.                           - Continue present medications.                           -  Await pathology results.                           - Repeat colonoscopy is recommended. The                            colonoscopy date will be determined after pathology                            results from today's exam become available for                            review. Beverley Fiedler, MD 01/18/2023 4:04:10 PM This report has been signed electronically.

## 2023-01-18 NOTE — Progress Notes (Signed)
Report given to PACU, vss 

## 2023-01-18 NOTE — Op Note (Addendum)
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 The patient's tolerance of                            previous anesthesia was also reviewed. The risks                            and benefits of the procedure and the sedation                            options and risks were discussed with the patient.                            All questions were answered, and informed consent                            was obtained. Prior Anticoagulants: The patient has                            taken no anticoagulant or antiplatelet agents. ASA                            Grade Assessment: III - A patient with severe  systemic disease. After reviewing the risks and                            benefits, the patient was deemed in satisfactory                            condition to undergo the procedure.                           After obtaining informed consent, the endoscope was                            passed under direct vision. Throughout the                            procedure, the  patient's blood pressure, pulse, and                            oxygen saturations were monitored continuously. The                            GIF [id number] #[id number] was introduced through the                            mouth, and advanced to the second part of duodenum.                            The upper GI endoscopy was accomplished without                            difficulty. The patient tolerated the procedure                            well. Scope In: 3:28:57 PM Scope Out: 3:38:57 PM Total Procedure Duration: 0 hours 10 minutes 0 seconds  Findings:                 The examined esophagus was normal. Biopsies were                            obtained from the proximal and distal esophagus                            with cold forceps for histology of suspected                            eosinophilic esophagitis.                           One non-bleeding cratered gastric ulcer with no                            stigmata of bleeding was found in the gastric  antrum. The lesion was 10 mm in largest dimension.                            Biopsies were taken with a cold forceps for                            histology.                           The exam of the stomach was otherwise normal.                           Biopsies were taken with a cold forceps in the                            gastric fundus, in the gastric body and at the                            incisura for histology.                           The examined duodenum was normal. Biopsies were                            taken with a cold forceps for histology. Complications:            No immediate complications. Estimated Blood Loss:     Estimated blood loss was minimal. Impression:               - Normal esophagus. Esophagitis has healed with BID                            PPI. Biopsies were taken with a cold forceps for                            evaluation of eosinophilic esophagitis (as seen in                             May 2024).                           - Non-bleeding gastric ulcer with no stigmata of                            bleeding. Biopsied around ulcers edges.                           - Biopsies were taken with a cold forceps for                            histology in the gastric fundus, in the gastric                            body and at the incisura.                           -  Normal examined duodenum. Biopsied. Recommendation:           - Patient has a contact number available for                            emergencies. The signs and symptoms of potential                            delayed complications were discussed with the                            patient. Return to normal activities tomorrow.                            Written discharge instructions were provided to the                            patient.                           - Resume previous diet.                           - Continue present medications. Continue twice                            daily pantoprazole 40 mg and NSAID avoidance.                           - Await pathology results. Beverley Fiedler, MD 01/18/2023 4:01:06 PM This report has been signed electronically.

## 2023-01-18 NOTE — Progress Notes (Signed)
Called to room to assist during endoscopic procedure.  Patient ID and intended procedure confirmed with present staff. Received instructions for my participation in the procedure from the performing physician.  

## 2023-01-19 ENCOUNTER — Telehealth: Payer: Self-pay | Admitting: *Deleted

## 2023-01-19 NOTE — Telephone Encounter (Signed)
  Follow up Call-     01/18/2023    2:44 PM 11/08/2022    9:23 AM  Call back number  Post procedure Call Back phone  # [phone] (sister) [phone]  Permission to leave phone message Yes Yes     Patient questions:  Do you have a fever, pain , or abdominal swelling? No. Pain Score  0 *  Have you tolerated food without any problems? Yes.    Have you been able to return to your normal activities? Yes.    Do you have any questions about your discharge instructions: Diet   No. Medications  No. Follow up visit  No.  Do you have questions or concerns about your Care? No.  Actions: * If pain score is 4 or above: No action needed, pain <4.

## 2023-01-23 ENCOUNTER — Encounter (HOSPITAL_BASED_OUTPATIENT_CLINIC_OR_DEPARTMENT_OTHER): Payer: Self-pay

## 2023-01-26 ENCOUNTER — Encounter: Payer: Self-pay | Admitting: Internal Medicine

## 2023-01-26 ENCOUNTER — Ambulatory Visit: Payer: 59

## 2023-01-26 ENCOUNTER — Ambulatory Visit (INDEPENDENT_AMBULATORY_CARE_PROVIDER_SITE_OTHER): Payer: 59 | Admitting: Internal Medicine

## 2023-01-26 ENCOUNTER — Telehealth: Payer: Self-pay | Admitting: *Deleted

## 2023-01-26 VITALS — BP 128/76 | HR 77 | Temp 99.0°F | Ht 67.0 in | Wt 209.0 lb

## 2023-01-26 DIAGNOSIS — R509 Fever, unspecified: Secondary | ICD-10-CM | POA: Diagnosis not present

## 2023-01-26 DIAGNOSIS — T781XXA Other adverse food reactions, not elsewhere classified, initial encounter: Secondary | ICD-10-CM | POA: Insufficient documentation

## 2023-01-26 DIAGNOSIS — J449 Chronic obstructive pulmonary disease, unspecified: Secondary | ICD-10-CM | POA: Diagnosis not present

## 2023-01-26 DIAGNOSIS — I10 Essential (primary) hypertension: Secondary | ICD-10-CM

## 2023-01-26 DIAGNOSIS — E559 Vitamin D deficiency, unspecified: Secondary | ICD-10-CM

## 2023-01-26 DIAGNOSIS — K253 Acute gastric ulcer without hemorrhage or perforation: Secondary | ICD-10-CM

## 2023-01-26 DIAGNOSIS — R6889 Other general symptoms and signs: Secondary | ICD-10-CM | POA: Diagnosis not present

## 2023-01-26 LAB — URINALYSIS, ROUTINE W REFLEX MICROSCOPIC
Bilirubin Urine: NEGATIVE
Hgb urine dipstick: NEGATIVE
Ketones, ur: NEGATIVE
Nitrite: POSITIVE — AB
RBC / HPF: NONE SEEN (ref 0–?)
Specific Gravity, Urine: 1.015 (ref 1.000–1.030)
Total Protein, Urine: 30 — AB
Urine Glucose: NEGATIVE
Urobilinogen, UA: 1 (ref 0.0–1.0)
pH: 6.5 (ref 5.0–8.0)

## 2023-01-26 LAB — POC COVID19 BINAXNOW: SARS Coronavirus 2 Ag: NEGATIVE

## 2023-01-26 NOTE — Telephone Encounter (Signed)
-----   Message from Carie Caddy Pyrtle sent at 01/26/2023  9:54 AM EDT ----- Pt needs egd in 3 months in [facility] to check ulcer healing I sent him a letter, but rather than wait on the recall, let's go ahead and arrange Thanks JMP

## 2023-01-26 NOTE — Patient Instructions (Signed)
Please continue all other medications as before, and refills have been done if requested.  Please have the pharmacy call with any other refills you may need.  Please continue your efforts at being more active, low cholesterol diet, and weight control.  Please keep your appointments with your specialists as you may have planned  Please go to the XRAY Department in the first floor for the x-ray testing  Please go to the LAB at the blood drawing area for the tests to be done - just the urine testing today  You will be contacted by phone if any changes need to be made immediately.  Otherwise, you will receive a letter about your results with an explanation, but please check with MyChart first.

## 2023-01-26 NOTE — Telephone Encounter (Signed)
I have spoken to patient sister, April (DPR signed) to advise that patient needs follow up of gastric ulcer in 3 months with endoscopy. Advised that appointment has been scheduled in [facility] on 04/27/23 at 2 pm, 1 pm arrival. Reminded April that patient would need a care partner 7 or older to bring him, stay with him, and take him following the procedure. Updated instructions have been created and made available via mychart. April verbalizes understanding of this information and denies any additional questions at this time.

## 2023-01-26 NOTE — Progress Notes (Unsigned)
Patient ID: Casey Brennan, male   DOB: [birth year], 62 y.o.   MRN: [id number]        Chief Complaint: follow up fever, copd, recent UTI, low vit d       HPI:  Casey Brennan is a 62 y.o. male here with low grade temp up to 100 over the past 3-4 days, Pt denies chest pain,cough, increased sob or doe, wheezing, orthopnea, PND, increased LE swelling, palpitations, dizziness or syncope.   Pt denies polydipsia, polyuria, or new focal neuro s/s.    Pt denies recent wt loss, night sweats, loss of appetite, or other constitutional symptoms  Denies urinary symptoms such as dysuria, frequency, urgency, flank pain, hematuria or n/v, fever, chills.       Wt Readings from Last 3 Encounters:  01/26/23 209 lb (94.8 kg)  01/18/23 203 lb (92.1 kg)  12/15/22 199 lb (90.3 kg)   BP Readings from Last 3 Encounters:  01/26/23 128/76  01/18/23 127/82  12/15/22 110/78         Past Medical History:  Diagnosis Date   Asthma    very rare   Chronic anemia    normocytic   Chronic folliculitis    Chronic kidney disease    Stage 2   COPD (chronic obstructive pulmonary disease) (HCC)    Dyspnea    Environmental and seasonal allergies    Glaucoma    right   H/O pericarditis    2010--  myopercarditis--  resolved   Headache(784.0)    HX SEVERE FRONTAL HA'S   Hiatal hernia    History of gastric ulcer    nonbleeding gastric ulcer 58//24   History of kidney stones    History of MRSA infection 2010   infected boil   HIV (human immunodeficiency virus infection) (HCC) 1988   Hypertension    Hypertensive cardiomyopathy (HCC)    EF 37%, normal coronaries 04/2020 [facility]; LVEF 60-65% 03/2022 echo   Lytic bone lesion of hip    WORK-UP DONE BY ONCOLOGIST DR HA --  NOT MALIGNANT   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   Pneumonia    hx of   Post concussion syndrome    resolved   Prostate cancer (HCC) 04/25/2013   gleason 3+3=6, volume 30 gm   Viral meningitis    06/2021   Past Surgical History:  Procedure  Laterality Date   ACDCF  09/29/2020   ANTERIOR CERVICAL DECOMPRESSION/DISCECTOMY FUSION 4 LEVELS N/A 09/29/2020   Procedure: ANTERIOR CERVICAL DECOMPRESSION FUSION CERVICAL 3-CERVICAL 4 , CERVICAL 4 - CERVICAL 5, CERVICAL 5 - CERVICAL 6 WITH INSTRUMENTATION AND ALLOGRAFT;  Surgeon: Estill Bamberg, MD;  Location: [facility] OR;  Service: Orthopedics;  Laterality: N/A;   CARDIAC CATHETERIZATION  03-23-2009  DR COOPER   NORMAL CORONARY ARTERIES   CHOLECYSTECTOMY N/A 11/06/2014   Procedure: LAPAROSCOPIC CHOLECYSTECTOMY WITH INTRAOPERATIVE CHOLANGIOGRAM;  Surgeon: Jimmye Norman, MD;  Location: [facility] OR;  Service: General;  Laterality: N/A;   COLONOSCOPY  12/26/2011   Procedure: COLONOSCOPY;  Surgeon: Shirley Friar, MD;  Location: [facility] ENDOSCOPY;  Service: Endoscopy;  Laterality: N/A;   COLONOSCOPY     COLONOSCOPY WITH PROPOFOL N/A 07/29/2014   Procedure: COLONOSCOPY WITH PROPOFOL;  Surgeon: Shirley Friar, MD;  Location: [facility] ENDOSCOPY;  Service: Endoscopy;  Laterality: N/A;   CYSTOSCOPY N/A 05/18/2018   Procedure: CYSTOSCOPY REMOVAL FOREIGN BODY;  Surgeon: Ihor Gully, MD;  Location: [facility] ORS;  Service: Urology;  Laterality: N/A;   CYSTOSCOPY/RETROGRADE/URETEROSCOPY Bilateral 04/25/2013   Procedure:  CYSTOSCOPY/ BILATERAL RETROGRADES; BLADDER BIOPSIES;  Surgeon: Sebastian Ache, MD;  Location: [facility];  Service: Urology;  Laterality: Bilateral;   DENTAL EXAMINATION UNDER ANESTHESIA     ESOPHAGOGASTRODUODENOSCOPY  12/26/2011   Procedure: ESOPHAGOGASTRODUODENOSCOPY (EGD);  Surgeon: Shirley Friar, MD;  Location: [facility]OSCOPY;  Service: Endoscopy;  Laterality: N/A;   ESOPHAGOGASTRODUODENOSCOPY (EGD) WITH PROPOFOL N/A 07/29/2014   Procedure: ESOPHAGOGASTRODUODENOSCOPY (EGD) WITH PROPOFOL;  Surgeon: Shirley Friar, MD;  Location: [facility] ENDOSCOPY;  Service: Endoscopy;  Laterality: N/A;   EXCISION CHRONIC LEFT BREAST ABSCESS  09-21-2010   EXCISIONAL BX LEFT BREAST MASS/  I  &  D LEFT BREAST  ABSCESS  03-24-2009   KNEE ARTHROSCOPY Right 1985   left axilla biopsy  04/2013   left hip biopsy  10/2012   LYMPHADENECTOMY Bilateral 08/18/2013   Procedure: LYMPHADENECTOMY "PELVIC LYMPH NODE DISSECTION";  Surgeon: Sebastian Ache, MD;  Location: [facility] ORS;  Service: Urology;  Laterality: Bilateral;   POSTERIOR CERVICAL FUSION/FORAMINOTOMY N/A 11/30/2022   Procedure: POSTERIOR DECOMPRESSION AND FUSION CERVICAL 4- CERVICAL 5, CERVICAL 5- CERVICAL 6, CERVICAL 6- CERVICAL 7, CERVICAL 7- THORACIC 1 WITH INSTRUMENTATION AND ALLOGRAFT;  Surgeon: Estill Bamberg, MD;  Location: [facility] OR;  Service: Orthopedics;  Laterality: N/A;   PROSTATE BIOPSY N/A 04/25/2013   Procedure: BIOPSY TRANSRECTAL ULTRASONIC PROSTATE (TUBP);  Surgeon: Sebastian Ache, MD;  Location: [facility];  Service: Urology;  Laterality: N/A;   ROBOT ASSISTED LAPAROSCOPIC RADICAL PROSTATECTOMY N/A 08/18/2013   Procedure: ROBOTIC ASSISTED LAPAROSCOPIC RADICAL PROSTATECTOMY;  Surgeon: Sebastian Ache, MD;  Location: [facility] ORS;  Service: Urology;  Laterality: N/A;   TOTAL KNEE ARTHROPLASTY Right 08/01/2016   08/17/16   TOTAL KNEE ARTHROPLASTY Right 08/01/2016   Procedure: TOTAL KNEE ARTHROPLASTY;  Surgeon: Sheral Apley, MD;  Location: [facility] OR;  Service: Orthopedics;  Laterality: Right;   UPPER GASTROINTESTINAL ENDOSCOPY     UPPER LEG SOFT TISSUE BIOPSY Left 2012   lthigh    reports that he quit smoking about 12 years ago. His smoking use included cigars and cigarettes. He started smoking about 35 years ago. He has a 34.5 pack-year smoking history. He has never used smokeless tobacco. He reports that he does not currently use alcohol. He reports that he does not use drugs. family history includes Asthma in his father and sister; Cancer in his father and maternal uncle; Diabetes in his father; Hypertension in his sister; Stroke in his father. Allergies  Allergen Reactions   Amoxapine Shortness Of Breath   Chocolate Shortness Of Breath  and Other (See Comments)    Asthma attack, welts   Ciprofloxacin Anaphylaxis, Dermatitis, Itching, Nausea And Vomiting, Shortness Of Breath, Swelling and Hives   Descovy [Emtricitabine-Tenofovir Af] Shortness Of Breath    Nasuea, vomiiting, diarrhea, sob, whelps, throat closes   Doxycycline Hyclate Shortness Of Breath and Swelling   Genvoya [Elviteg-Cobic-Emtricit-Tenofaf] Shortness Of Breath    Nausea, vomitting, diarreha, sob, rash, throat closing   Morphine And Codeine Shortness Of Breath, Itching and Other (See Comments)     chest pain   Other Anaphylaxis   Oxycodone-Acetaminophen Shortness Of Breath   Penicillins Shortness Of Breath and Rash    Has patient had a PCN reaction causing immediate rash, facial/tongue/throat swelling, SOB or lightheadedness with hypotension: Yes Has patient had a PCN reaction causing severe rash involving mucus membranes or skin necrosis: No Has patient had a PCN reaction that required hospitalization No Has patient had a PCN reaction occurring within the last 10 years: Yes If  all of the above answers are "NO", then may proceed with Cephalosporin use.   Sulfa Antibiotics Anaphylaxis and Other (See Comments)   Tomato Shortness Of Breath   Vancomycin Shortness Of Breath and Other (See Comments)   Darunavir Itching   Amlodipine Other (See Comments)    swelling   Carvedilol     LEG SWELLING, DIZZNESS   Flagyl [Metronidazole] Itching   Meloxicam    Milk-Related Compounds    Morphine Other (See Comments)   Oxycodone Hcl Other (See Comments)   Penicillin G Other (See Comments)   Toradol [Ketorolac Tromethamine] Nausea And Vomiting    itching   Acetaminophen Rash and Other (See Comments)    Pt tolerates percocet and also plain APAP without rash   Tramadol Itching, Nausea And Vomiting and Other (See Comments)   Current Outpatient Medications on File Prior to Visit  Medication Sig Dispense Refill   albuterol (PROVENTIL) (2.5 MG/3ML) 0.083% nebulizer  solution Take 3 mLs (2.5 mg total) by nebulization every 6 (six) hours as needed for wheezing or shortness of breath. 75 mL 3   albuterol (VENTOLIN HFA) 108 (90 Base) MCG/ACT inhaler Inhale 2 puffs into the lungs every 6 (six) hours as needed for wheezing or shortness of breath. 36 g 3   ALPRAZolam (XANAX) 0.5 MG tablet TAKE 1 TABLET(0.5 MG) BY MOUTH TWICE DAILY AS NEEDED FOR SLEEP 60 tablet 5   azelastine (ASTELIN) 0.1 % nasal spray Place 2 sprays into both nostrils in the morning. Use in each nostril as directed     BIKTARVY 50-200-25 MG TABS tablet TAKE 1 TABLET BY MOUTH DAILY 30 tablet 11   Budeson-Glycopyrrol-Formoterol (BREZTRI AEROSPHERE) 160-9-4.8 MCG/ACT AERO INHALE 2 PUFFS INTO THE LUNGS IN THE MORNING AND AT BEDTIME 10.7 g 3   Cholecalciferol (VITAMIN D3) 250 MCG (10000 UT) capsule Take 1 tablet by mouth daily. [employer]'s Made     cyclobenzaprine (FLEXERIL) 10 MG tablet Take 1 tablet (10 mg total) by mouth at bedtime. 90 tablet 3   diphenhydrAMINE (BENADRYL) 25 MG tablet Take 1 tablet (25 mg total) by mouth every 6 (six) hours as needed. (Patient taking differently: Take 25 mg by mouth every 6 (six) hours as needed for sleep, itching or allergies.) 30 tablet 0   dorzolamide (TRUSOPT) 2 % ophthalmic solution Place 1 drop into both eyes 2 (two) times daily.     EPINEPHrine 0.3 mg/0.3 mL IJ SOAJ injection Inject 0.3 mg into the muscle as needed for anaphylaxis. 1 each 5   gabapentin (NEURONTIN) 300 MG capsule TAKE 1 CAPSULE(300 MG) BY MOUTH FOUR TIMES DAILY AS NEEDED FOR PAIN 120 capsule 4   Glucos-Chond-Hyal Ac-Ca Fructo (MOVE FREE JOINT HEALTH ADVANCE PO) Take 1 tablet by mouth daily.     HYDROmorphone (DILAUDID) 2 MG tablet Take 1 tablet (2 mg total) by mouth every 4 (four) hours as needed for severe pain. 30 tablet 0   hydrOXYzine (ATARAX) 50 MG tablet TAKE 1 TABLET(50 MG) BY MOUTH TWICE DAILY 60 tablet 1   meclizine (ANTIVERT) 25 MG tablet Take 1 tablet (25 mg total) by mouth daily as  needed. 40 tablet 5   methocarbamol (ROBAXIN) 500 MG tablet Take 500 mg by mouth 4 (four) times daily.     methocarbamol (ROBAXIN) 500 MG tablet Take 1-2 tablets (500-1,000 mg total) by mouth every 6 (six) hours as needed for muscle spasms. 30 tablet 2   metoprolol succinate (TOPROL-XL) 100 MG 24 hr tablet TAKE 1 TABLET(100 MG) BY MOUTH DAILY  WITH OR IMMEDIATELY FOLLOWING A MEAL (Patient taking differently: Take 100 mg by mouth daily.) 90 tablet 3   montelukast (SINGULAIR) 10 MG tablet Take 10 mg by mouth daily.     Multiple Vitamin (MULTIVITAMIN WITH MINERALS) TABS tablet Take 1 tablet by mouth daily after breakfast.      mupirocin ointment (BACTROBAN) 2 % APPLY EXTERNALLY TO THE AFFECTED AREA TWICE DAILY FOR 3 WEEKS, PLACE COTTON BETWEEN TOES TO ALLOW BETTER AERATION AS NEEDED (Patient taking differently: Apply 1 application  topically daily as needed (affected toes).) 66 g 1   ondansetron (ZOFRAN-ODT) 4 MG disintegrating tablet Take 1 tablet (4 mg total) by mouth every 8 (eight) hours as needed for nausea or vomiting. (Patient taking differently: Take 4 mg by mouth every 4 (four) hours as needed for nausea or vomiting.) 30 tablet 2   OVER THE COUNTER MEDICATION Take 2 capsules by mouth daily. Zhou: thyroid supplement     pantoprazole (PROTONIX) 40 MG tablet Take 1 tablet (40 mg total) by mouth 2 (two) times daily before a meal. 180 tablet 3   QNASL 80 MCG/ACT AERS USE 2 SPRAYS IN EACH NOSTRIL DAILY (Patient taking differently: Place 2 sprays into both nostrils in the morning and at bedtime.) 10.6 g 2   QULIPTA 30 MG TABS Take 1 tablet (30 mg total) by mouth daily. 30 tablet 5   rosuvastatin (CRESTOR) 20 MG tablet TAKE 1 TABLET(20 MG) BY MOUTH DAILY (Patient taking differently: Take 20 mg by mouth daily.) 90 tablet 3   tizanidine (ZANAFLEX) 6 MG capsule Take 6 mg by mouth 3 (three) times daily.     torsemide (DEMADEX) 20 MG tablet Take 10 mg by mouth See admin instructions. Tuesday Thursday Saturday      Ubrogepant (UBRELVY) 100 MG TABS Take 1 tablet (100 mg total) by mouth See admin instructions. Every 2 hours as needed. 30 tablet 0   cetirizine (ZYRTEC) 10 MG tablet Take 1 tablet (10 mg total) by mouth daily. 90 tablet 3   nitroGLYCERIN (NITROSTAT) 0.4 MG SL tablet Place 1 tablet (0.4 mg total) under the tongue every 5 (five) minutes as needed for chest pain. 30 tablet 1   [DISCONTINUED] zolpidem (AMBIEN) 5 MG tablet Take 1-2 tablets (5-10 mg total) by mouth at bedtime as needed for sleep. (Patient not taking: Reported on 04/23/2019) 30 tablet 2   No current facility-administered medications on file prior to visit.        ROS:  All others reviewed and negative.  Objective        PE:  BP 128/76 (BP Location: Left Arm, Patient Position: Sitting, Cuff Size: Normal)   Pulse 77   Temp 99 F (37.2 C) (Oral)   Ht 5\' 7"  (1.702 m)   Wt 209 lb (94.8 kg)   SpO2 97%   BMI 32.73 kg/m                 Constitutional: Pt appears in NAD               HENT: Head: NCAT.                Right Ear: External ear normal.                 Left Ear: External ear normal.                Eyes: . Pupils are equal, round, and reactive to light. Conjunctivae and EOM are normal  Nose: without d/c or deformity               Neck: Neck supple. Gross normal ROM               Cardiovascular: Normal rate and regular rhythm.                 Pulmonary/Chest: Effort normal and breath sounds without rales or wheezing.                Abd:  Soft, NT, ND, + BS, no organomegaly               Neurological: Pt is alert. At baseline orientation, motor grossly intact               Skin: Skin is warm. No rashes, no other new lesions, LE edema - none               Psychiatric: Pt behavior is normal without agitation   Micro: none  Cardiac tracings I have personally interpreted today:  none  Pertinent Radiological findings (summarize): none   Lab Results  Component Value Date   WBC 5.8 12/06/2022   HGB 12.6  (L) 12/06/2022   HCT 38.0 (L) 12/06/2022   PLT 292 12/06/2022   GLUCOSE 105 (H) 12/06/2022   CHOL 139 08/14/2022   TRIG 101.0 08/14/2022   HDL 39.10 08/14/2022   LDLCALC 80 08/14/2022   ALT 53 (H) 12/06/2022   AST 40 12/06/2022   NA 133 (L) 12/06/2022   K 3.1 (L) 12/06/2022   CL 95 (L) 12/06/2022   CREATININE 1.38 (H) 12/06/2022   BUN 6 12/06/2022   CO2 28 12/06/2022   TSH 2.51 08/14/2022   PSA 0.00 (L) 08/14/2022   INR 1.0 06/23/2021   HGBA1C <4.2 08/15/2022   MICROALBUR 16.88 (H) 10/05/2011   POCT- COVID - neg  Assessment/Plan:  Casey Brennan is a 62 y.o. 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  COPD GOLD III Stable overall, cont inhaler prn  Essential hypertension BP Readings from Last 3 Encounters:  01/26/23 128/76  01/18/23 127/82  12/15/22 110/78   Stable, pt to continue medical treatment toprol xl 100 qd   Vitamin D deficiency Last vitamin D Lab Results  Component Value Date   VD25OH 27.12 (L) 08/14/2022   Low, to start oral replacement   Fever Etiology unclear, for cxr, labs including ua and cbc,  to f/u any worsening symptoms or concerns  Followup: Return if symptoms worsen or fail to improve.  Oliver Barre, MD 01/28/2023 2:11 PM [facility] -  [facility] Internal Medicine

## 2023-01-28 ENCOUNTER — Other Ambulatory Visit: Payer: Self-pay | Admitting: Internal Medicine

## 2023-01-28 ENCOUNTER — Encounter: Payer: Self-pay | Admitting: Internal Medicine

## 2023-01-28 DIAGNOSIS — R509 Fever, unspecified: Secondary | ICD-10-CM | POA: Insufficient documentation

## 2023-01-28 MED ORDER — NITROFURANTOIN MACROCRYSTAL 50 MG PO CAPS: 50.0000 mg | ORAL_CAPSULE | Freq: Four times a day (QID) | ORAL | 0 refills | Status: DC

## 2023-01-28 NOTE — Assessment & Plan Note (Signed)
Stable overall, cont inhaler prn 

## 2023-01-28 NOTE — Assessment & Plan Note (Signed)
Etiology unclear, for cxr, labs including ua and cbc,  to f/u any worsening symptoms or concerns

## 2023-01-28 NOTE — Assessment & Plan Note (Signed)
Last vitamin D Lab Results  Component Value Date   VD25OH 27.12 (L) 08/14/2022   Low, to start oral replacement  

## 2023-01-28 NOTE — Assessment & Plan Note (Signed)
BP Readings from Last 3 Encounters:  01/26/23 128/76  01/18/23 127/82  12/15/22 110/78   Stable, pt to continue medical treatment toprol xl 100 qd

## 2023-01-29 ENCOUNTER — Ambulatory Visit (INDEPENDENT_AMBULATORY_CARE_PROVIDER_SITE_OTHER): Payer: 59 | Admitting: Pulmonary Disease

## 2023-01-29 DIAGNOSIS — J449 Chronic obstructive pulmonary disease, unspecified: Secondary | ICD-10-CM

## 2023-01-29 LAB — PULMONARY FUNCTION TEST
DL/VA % pred: 107 %
DL/VA: 4.61 ml/min/mmHg/L
DLCO cor % pred: 87 %
DLCO cor: 23.5 ml/min/mmHg
DLCO unc % pred: 81 %
DLCO unc: 22.06 ml/min/mmHg
FEF 25-75 Post: 1.08 L/sec
FEF 25-75 Pre: 0.58 L/sec
FEF2575-%Change-Post: 87 %
FEF2575-%Pred-Post: 36 %
FEF2575-%Pred-Pre: 19 %
FEV1-%Change-Post: 27 %
FEV1-%Pred-Post: 40 %
FEV1-%Pred-Pre: 31 %
FEV1-Post: 1.41 L
FEV1-Pre: 1.11 L
FEV1FVC-%Change-Post: 7 %
FEV1FVC-%Pred-Pre: 72 %
FEV6-%Change-Post: 13 %
FEV6-%Pred-Post: 50 %
FEV6-%Pred-Pre: 45 %
FEV6-Post: 2.25 L
FEV6-Pre: 1.99 L
FEV6FVC-%Change-Post: 0 %
FEV6FVC-%Pred-Post: 104 %
FEV6FVC-%Pred-Pre: 103 %
FVC-%Change-Post: 18 %
FVC-%Pred-Post: 51 %
FVC-%Pred-Pre: 43 %
FVC-Post: 2.39 L
FVC-Pre: 2.02 L
Post FEV1/FVC ratio: 59 %
Post FEV6/FVC ratio: 99 %
Pre FEV1/FVC ratio: 55 %
Pre FEV6/FVC Ratio: 99 %
RV % pred: 160 %
RV: 3.48 L
TLC % pred: 92 %
TLC: 6.25 L

## 2023-01-29 NOTE — Progress Notes (Signed)
Full PFT performed today. °

## 2023-01-29 NOTE — Patient Instructions (Signed)
Full PFT performed today. °

## 2023-01-31 ENCOUNTER — Encounter (INDEPENDENT_AMBULATORY_CARE_PROVIDER_SITE_OTHER): Payer: Self-pay

## 2023-01-31 DIAGNOSIS — I739 Peripheral vascular disease, unspecified: Secondary | ICD-10-CM | POA: Diagnosis not present

## 2023-02-01 ENCOUNTER — Ambulatory Visit (INDEPENDENT_AMBULATORY_CARE_PROVIDER_SITE_OTHER): Payer: 59 | Admitting: Pulmonary Disease

## 2023-02-01 ENCOUNTER — Encounter (HOSPITAL_BASED_OUTPATIENT_CLINIC_OR_DEPARTMENT_OTHER): Payer: 59

## 2023-02-01 ENCOUNTER — Encounter (HOSPITAL_BASED_OUTPATIENT_CLINIC_OR_DEPARTMENT_OTHER): Payer: Self-pay | Admitting: Pulmonary Disease

## 2023-02-01 VITALS — BP 116/78 | HR 63 | Resp 16 | Ht 67.0 in | Wt 211.4 lb

## 2023-02-01 DIAGNOSIS — J449 Chronic obstructive pulmonary disease, unspecified: Secondary | ICD-10-CM | POA: Diagnosis not present

## 2023-02-01 DIAGNOSIS — R911 Solitary pulmonary nodule: Secondary | ICD-10-CM

## 2023-02-01 NOTE — Assessment & Plan Note (Addendum)
Continue Breztri. Use albuterol as needed for rescue. We discussed signs and symptoms of COPD exacerbation and treatment plan for the same He would be cleared for shoulder surgery given that he has recently gone through other surgeries.  Would be careful with nerve block

## 2023-02-01 NOTE — Progress Notes (Signed)
   Subjective:    Patient ID: Casey Brennan, male    DOB: [birth year], 62 y.o.   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   HIV - diagnosed 28 ,last CD 4 479 03/2020 Viral meningits He smoked more than 30 pack years before he quit in 2012 -schatzki ring - dilated   Chief Complaint  Patient presents with   Follow-up    PFT results. He had on Tuesday. He wants to discuss CT scan and wheeze.   Accompanied by his Sister Casey Brennan. We reviewed PFTs today. He underwent neck surgery and this was uneventful. He also underwent EGD and colonoscopy under propofol. He is now scheduled for left shoulder repair of rotator cuff Is compliant with Breztri and this is helpful. He complains of occasional mucus production  We reviewed prior CT chest and PFTs    Significant tests/ events reviewed   10/2022 CT c/A/P >> unchanged nodule, 3 mm right upper lobe cystic bronchiectasis, unchanged -2 subcentimeter left iliac lytic lesions -unchanged 02/26/22 CTA chest >> RUL 4mm nodule, likely LN CT cors 04/2020 ca score 0   PFT 01/2023 moderate airway obstruction, ratio 59, FEV1 1.41/40%, FVC 51%, positive bronchodilator response, DLCO 81%  spirometry 07/22/2014  FEV1  1.12 (34%) ratio 59  AEC 900   Home sleep test 07/2021 mild, AHI 8/hour, low sat 88%, worse when supine    Review of Systems neg for any significant sore throat, dysphagia, itching, sneezing, nasal congestion or excess/ purulent secretions, fever, chills, sweats, unintended wt loss, pleuritic or exertional cp, hempoptysis, orthopnea pnd or change in chronic leg swelling. Also denies presyncope, palpitations, heartburn, abdominal pain, nausea, vomiting, diarrhea or change in bowel or urinary habits, dysuria,hematuria, rash, arthralgias, visual  complaints, headache, numbness weakness or ataxia.     Objective:   Physical Exam  Gen. Pleasant, obese, in no distress ENT - no lesions, no post nasal drip Neck: No JVD, no thyromegaly, no carotid bruits Lungs: no use of accessory muscles, no dullness to percussion, decreased without rales or rhonchi  Cardiovascular: Rhythm regular, heart sounds  normal, no murmurs or gallops, no peripheral edema Musculoskeletal: No deformities, no cyanosis or clubbing , no tremors       Assessment & Plan:

## 2023-02-01 NOTE — Assessment & Plan Note (Signed)
Present in 2023 and considered benign. Would continue annual low-dose CT screening

## 2023-02-01 NOTE — Patient Instructions (Addendum)
Refills as needed

## 2023-02-06 ENCOUNTER — Telehealth: Payer: Self-pay | Admitting: Neurology

## 2023-02-06 NOTE — Telephone Encounter (Signed)
Pt is experiencing extreme cold feet and toes turning purple. Pt's sister would like a call to discuss these symptoms

## 2023-02-06 NOTE — Telephone Encounter (Signed)
I spoke with the patient's sister, [name]. She said that the patient has been having extreme coldness in bilateral lower extremities for one to two months. He has this issue without swelling. It is very bothersome. He had emergency neck surgery on 11/30/2022, his symptoms did not start directly after. He sees pulmonary medicine and does not have circulatory issues (confirmed by circulation test). He does not have Raynaud's disease. He remains on Gabapentin 300 mg QID. He is on schedule later in the month for a nerve conduction study and EMG.

## 2023-02-07 ENCOUNTER — Other Ambulatory Visit: Payer: Self-pay

## 2023-02-07 ENCOUNTER — Ambulatory Visit (INDEPENDENT_AMBULATORY_CARE_PROVIDER_SITE_OTHER): Payer: 59 | Admitting: Orthopedic Surgery

## 2023-02-07 DIAGNOSIS — M79641 Pain in right hand: Secondary | ICD-10-CM | POA: Diagnosis not present

## 2023-02-07 DIAGNOSIS — G5621 Lesion of ulnar nerve, right upper limb: Secondary | ICD-10-CM

## 2023-02-07 DIAGNOSIS — M25521 Pain in right elbow: Secondary | ICD-10-CM

## 2023-02-07 NOTE — Progress Notes (Signed)
Casey Brennan - 62 y.o. male MRN [id number]  Date of birth: [birth year]  Office Visit Note: Visit Date: 02/07/2023 PCP: Corwin Levins, MD Referred by: Corwin Levins, MD  Subjective: Chief Complaint  Patient presents with   Right Hand - Pain   HPI: Casey Brennan is a pleasant 62 y.o. male who presents today for evaluation of ongoing right hand numbness and tingling in the ring and small finger.  He is also demonstrating progressive clawing of the ring and small finger with associated atrophy of the intrinsic musculature in the adductor muscles.  He has a complex history of prior cervical decompression surgery both anterior and posterior, most recent surgery was done earlier this year with significant improvement in bilateral upper extremity symptoms.  His right-sided symptoms have remained however, particularly related to the ulnar nerve of the right upper extremity.  He has not undergone prior EMG for the right upper extremity.  He does remain active at baseline, has full use of the right upper extremity currently with daily activities.  Visit Reason: Right hand / 4th&5th digits Hand dominance: right Occupation: Truck driver Diabetic: No  Prior Testing: None Injections: None Treatments: None Prior Surgery: None   *years of pain* *hand is sunken in* *unable to straighten ring/ small finger*  Pertinent ROS were reviewed with the patient and found to be negative unless otherwise specified above in HPI.   Assessment & Plan: Visit Diagnoses:  1. Entrapment of right ulnar nerve   2. Pain in right hand   3. Pain in right elbow     Plan: Extensive discussion was had with patient today regarding his right upper extremity.  He demonstrates significant clinical signs and symptoms consistent with severe ulnar nerve entrapment and associated atrophy.  At this juncture, given his significant muscle wasting and progressive clawing, he is indicated for likely surgical intervention in  the form of nerve decompression.  First, I would like to obtain a right upper extremity EMG to better understand severity and specific location of ulnar nerve entrapment, in all likelihood it is worse at the cubital tunnel and potentially ulnar tunnel region of the wrist.  The EMG study will also help understand the condition of his musculature and better guide potential recovery.  We also did discuss his significant muscle atrophy and wasting, I did emphasize that the muscle will be the most difficult to regenerate status post decompression.  For the time being, we will move forward with right upper extremity EMG to be performed urgently, he will follow-up with me after EMG studies completed for further surgical discussion.  Follow-up: No follow-ups on file.   Meds & Orders: No orders of the defined types were placed in this encounter.   Orders Placed This Encounter  Procedures   XR Hand Complete Right   XR Elbow Complete Right (3+View)   Ambulatory referral to Physical Medicine Rehab     Procedures: No procedures performed      Clinical History: No specialty comments available.  He reports that he quit smoking about 12 years ago. His smoking use included cigars and cigarettes. He started smoking about 35 years ago. He has a 34.5 pack-year smoking history. He has never used smokeless tobacco.  Recent Labs    08/15/22 0735  HGBA1C <4.2    Objective:   Vital Signs: There were no vitals taken for this visit.  Physical Exam  Gen: Well-appearing, in no acute distress; non-toxic CV: Regular Rate. Well-perfused. Warm.  Resp: Breathing unlabored on room air; no wheezing. Psych: Fluid speech in conversation; appropriate affect; normal thought process Neuro: Sensation intact throughout. No gross coordination deficits.   Ortho Exam PHYSICAL EXAM:  General: Patient is well appearing and in no distress.  Range of Motion and Palpation Tests: Mobility is full about the elbows with  flexion and extension.  No evidence of nerve subluxation bilaterally at the elbows.   Forearm supination and pronation are 85/85 bilaterally.  Wrist flexion/extension is 75/65 bilaterally.  Right hand does demonstrate notable clawing of the ring and small finger, passively correctable.  No cords or nodules are palpated.  No triggering is observed.     Neurologic, Vascular, Motor: Sensation is diminished to light touch in the right ulnar nerve distribution.  Tinel's testing positive at cubital tunnel, Guyon's canal right side.  Two point discrimination is unable to be discerned in the ulnar nerve distribution right hand.    Positive Froment's right side, significant adductor atrophy  Fingers pink and well perfused.  Capillary refill is brisk.      Lab Results  Component Value Date   HGBA1C <4.2 08/15/2022     Imaging: No results found.  Past Medical/Family/Surgical/Social History: Medications & Allergies reviewed per EMR, new medications updated. Patient Active Problem List   Diagnosis Date Noted   Pulmonary nodule 02/01/2023   Fever 01/28/2023   Adverse reaction to food 01/26/2023   Left shoulder pain 12/16/2022   Cervical disc disorder with radiculopathy of cervical region 11/30/2022   Malignant neoplasm metastatic to bone (HCC) 11/16/2022   Ulcer of toe, right, limited to breakdown of skin (HCC) 11/16/2022   Cardiomyopathy due to hypertension, without heart failure (HCC) 11/16/2022   Oropharyngeal dysphagia 10/26/2022   Bronchiectasis (HCC) 10/19/2022   Right renal mass 10/19/2022   Abnormal positron emission tomography (PET) scan 10/05/2022   Acute dysfunction of left eustachian tube 09/11/2022   Other chest pain 08/14/2022   External otitis of left ear 08/14/2022   COPD exacerbation (HCC) 06/14/2022   Precordial pain 02/08/2022   Finger pulp abscess, right 11/24/2021   Currently asymptomatic HIV infection, with history of HIV-related illness (HCC) 10/27/2021    Migraine with aura and without status migrainosus, not intractable 10/27/2021   Abnormal urine odor 08/24/2021   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   Routine screening for STI (sexually transmitted infection) 06/29/2021   Viral meningitis, unspecified 06/23/2021   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 06/22/2021   Apnea present when patient supine 06/22/2021   Action tremor 06/22/2021   AKI (acute kidney injury) (HCC) 12/29/2020   Allergy to dairy product 12/27/2020   Angioneurotic edema 12/27/2020   Mild persistent asthma, uncomplicated 12/27/2020   Allergic rhinitis 12/27/2020   Vitamin D deficiency 12/27/2020   S/P cervical spinal fusion 11/11/2020   Nevus 11/11/2020   Myelopathy (HCC) 09/29/2020   Chronic obstructive pulmonary disease with acute exacerbation (HCC) 07/13/2020   Encounter for well adult exam with abnormal findings 06/23/2020   Elevated TSH 05/20/2020   Mixed hyperlipidemia 05/12/2020   Generalized abdominal pain 07/07/2019   Peripheral neuropathy 06/02/2019   Healthcare maintenance 01/08/2019   Myopia of both eyes with astigmatism and presbyopia 09/13/2018   Tension headache 11/12/2017   Hepatitis B immune 06/06/2017   Chronic pain of left wrist 06/06/2017   Constipation 09/27/2016   Chronic renal insufficiency, stage 2 (mild) 05/22/2016   Skin lesion of right lower extremity 02/22/2016   Migraines 02/22/2016   Low back pain  02/22/2016   Osteoarthritis of right knee 01/11/2016   Kidney stones 10/25/2015   Sinusitis, chronic 05/14/2015   Cough 01/18/2015   GERD (gastroesophageal reflux disease) 07/29/2014   COPD GOLD III 07/22/2014   Hx of primary hypertension 04/27/2014   H/O prostate cancer    Lytic bone lesion of hip 08/22/2012   Cataract, nuclear 05/15/2012   Nuclear sclerotic cataract of both eyes 05/15/2012   Insomnia 05/11/2011   Essential hypertension 04/17/2011   HIV infection (HCC) 04/19/2009   Past Medical  History:  Diagnosis Date   Asthma    very rare   Chronic anemia    normocytic   Chronic folliculitis    Chronic kidney disease    Stage 2   COPD (chronic obstructive pulmonary disease) (HCC)    Dyspnea    Environmental and seasonal allergies    Glaucoma    right   H/O pericarditis    2010--  myopercarditis--  resolved   Headache(784.0)    HX SEVERE FRONTAL HA'S   Hiatal hernia    History of gastric ulcer    nonbleeding gastric ulcer 58//24   History of kidney stones    History of MRSA infection 2010   infected boil   HIV (human immunodeficiency virus infection) (HCC) 1988   Hypertension    Hypertensive cardiomyopathy (HCC)    EF 37%, normal coronaries 04/2020 CCTA; LVEF 60-65% 03/2022 echo   Lytic bone lesion of hip    WORK-UP DONE BY ONCOLOGIST DR HA --  NOT MALIGNANT   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   Pneumonia    hx of   Post concussion syndrome    resolved   Prostate cancer (HCC) 04/25/2013   gleason 3+3=6, volume 30 gm   Viral meningitis    06/2021   Family History  Problem Relation Age of Onset   Stroke Father    Diabetes Father    Cancer Father        brain cancer   Asthma Father    Hypertension Sister    Cancer Maternal Uncle        prostate cancer   Asthma Sister    Colon cancer Neg Hx    Colon polyps Neg Hx    Esophageal cancer Neg Hx    Stomach cancer Neg Hx    Rectal cancer Neg Hx    Past Surgical History:  Procedure Laterality Date   ACDCF  09/29/2020   ANTERIOR CERVICAL DECOMPRESSION/DISCECTOMY FUSION 4 LEVELS N/A 09/29/2020   Procedure: ANTERIOR CERVICAL DECOMPRESSION FUSION CERVICAL 3-CERVICAL 4 , CERVICAL 4 - CERVICAL 5, CERVICAL 5 - CERVICAL 6 WITH INSTRUMENTATION AND ALLOGRAFT;  Surgeon: Estill Bamberg, MD;  Location: [facility] OR;  Service: Orthopedics;  Laterality: N/A;   CARDIAC CATHETERIZATION  03-23-2009  DR COOPER   NORMAL CORONARY ARTERIES   CHOLECYSTECTOMY N/A 11/06/2014   Procedure: LAPAROSCOPIC CHOLECYSTECTOMY WITH  INTRAOPERATIVE CHOLANGIOGRAM;  Surgeon: Jimmye Norman, MD;  Location: [facility] OR;  Service: General;  Laterality: N/A;   COLONOSCOPY  12/26/2011   Procedure: COLONOSCOPY;  Surgeon: Shirley Friar, MD;  Location: [facility] ENDOSCOPY;  Service: Endoscopy;  Laterality: N/A;   COLONOSCOPY     COLONOSCOPY WITH PROPOFOL N/A 07/29/2014   Procedure: COLONOSCOPY WITH PROPOFOL;  Surgeon: Shirley Friar, MD;  Location: [facility] ENDOSCOPY;  Service: Endoscopy;  Laterality: N/A;   CYSTOSCOPY N/A 05/18/2018   Procedure: CYSTOSCOPY REMOVAL FOREIGN BODY;  Surgeon: Ihor Gully, MD;  Location: [facility] ORS;  Service: Urology;  Laterality: N/A;   CYSTOSCOPY/RETROGRADE/URETEROSCOPY  Bilateral 04/25/2013   Procedure: CYSTOSCOPY/ BILATERAL RETROGRADES; BLADDER BIOPSIES;  Surgeon: Sebastian Ache, MD;  Location: [facility];  Service: Urology;  Laterality: Bilateral;   DENTAL EXAMINATION UNDER ANESTHESIA     ESOPHAGOGASTRODUODENOSCOPY  12/26/2011   Procedure: ESOPHAGOGASTRODUODENOSCOPY (EGD);  Surgeon: Shirley Friar, MD;  Location: [facility];  Service: Endoscopy;  Laterality: N/A;   ESOPHAGOGASTRODUODENOSCOPY (EGD) WITH PROPOFOL N/A 07/29/2014   Procedure: ESOPHAGOGASTRODUODENOSCOPY (EGD) WITH PROPOFOL;  Surgeon: Shirley Friar, MD;  Location: [facility] ENDOSCOPY;  Service: Endoscopy;  Laterality: N/A;   EXCISION CHRONIC LEFT BREAST ABSCESS  09-21-2010   EXCISIONAL BX LEFT BREAST MASS/  I  &  D LEFT BREAST ABSCESS  03-24-2009   KNEE ARTHROSCOPY Right 1985   left axilla biopsy  04/2013   left hip biopsy  10/2012   LYMPHADENECTOMY Bilateral 08/18/2013   Procedure: LYMPHADENECTOMY "PELVIC LYMPH NODE DISSECTION";  Surgeon: Sebastian Ache, MD;  Location: [facility] ORS;  Service: Urology;  Laterality: Bilateral;   POSTERIOR CERVICAL FUSION/FORAMINOTOMY N/A 11/30/2022   Procedure: POSTERIOR DECOMPRESSION AND FUSION CERVICAL 4- CERVICAL 5, CERVICAL 5- CERVICAL 6, CERVICAL 6- CERVICAL 7, CERVICAL 7- THORACIC 1 WITH INSTRUMENTATION  AND ALLOGRAFT;  Surgeon: Estill Bamberg, MD;  Location: [facility] OR;  Service: Orthopedics;  Laterality: N/A;   PROSTATE BIOPSY N/A 04/25/2013   Procedure: BIOPSY TRANSRECTAL ULTRASONIC PROSTATE (TUBP);  Surgeon: Sebastian Ache, MD;  Location[facility];  Service: Urology;  Laterality: N/A;   ROBOT ASSISTED LAPAROSCOPIC RADICAL PROSTATECTOMY N/A 08/18/2013   Procedure: ROBOTIC ASSISTED LAPAROSCOPIC RADICAL PROSTATECTOMY;  Surgeon: Sebastian Ache, MD;  Location: [facility] ORS;  Service: Urology;  Laterality: N/A;   TOTAL KNEE ARTHROPLASTY Right 08/01/2016   08/17/16   TOTAL KNEE ARTHROPLASTY Right 08/01/2016   Procedure: TOTAL KNEE ARTHROPLASTY;  Surgeon: Sheral Apley, MD;  Location: [facility] OR;  Service: Orthopedics;  Laterality: Right;   UPPER GASTROINTESTINAL ENDOSCOPY     UPPER LEG SOFT TISSUE BIOPSY Left 2012   lthigh   Social History   Occupational History   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 declined condoms     Fara Boros) Denese Killings, M.D. [facility] 10:07 AM

## 2023-02-12 ENCOUNTER — Encounter: Payer: 59 | Admitting: Neurology

## 2023-02-12 NOTE — Telephone Encounter (Signed)
I spoke with the patient's sister, Casey Brennan and relayed the message from Dr. Vickey Huger. She said her brother is having a right arm nerve conduction study preformed at [facility] to see if he has an ulnar nerve compression. This will take prior to the nerve conduction at [facility].   I informed her that the current orders from Dr. Vickey Huger were to investigate the patient's ulnar nerve as well.   She reiterated her concern about Casey Brennan feet and would like a nerve conduction study to be done for bilateral feet. I let Casey Brennan know that I would confirm with a physician if a bilateral lower extremity study was recommended.  She verbalized understanding/appreciation for the call.

## 2023-02-14 ENCOUNTER — Encounter: Payer: Self-pay | Admitting: Physical Medicine and Rehabilitation

## 2023-02-14 ENCOUNTER — Ambulatory Visit: Payer: 59 | Admitting: Physical Medicine and Rehabilitation

## 2023-02-14 ENCOUNTER — Ambulatory Visit: Payer: 59 | Admitting: Internal Medicine

## 2023-02-14 DIAGNOSIS — M25521 Pain in right elbow: Secondary | ICD-10-CM

## 2023-02-14 DIAGNOSIS — R202 Paresthesia of skin: Secondary | ICD-10-CM

## 2023-02-14 DIAGNOSIS — M7542 Impingement syndrome of left shoulder: Secondary | ICD-10-CM | POA: Diagnosis not present

## 2023-02-14 DIAGNOSIS — M79641 Pain in right hand: Secondary | ICD-10-CM

## 2023-02-14 DIAGNOSIS — R531 Weakness: Secondary | ICD-10-CM

## 2023-02-14 DIAGNOSIS — Z981 Arthrodesis status: Secondary | ICD-10-CM | POA: Diagnosis not present

## 2023-02-14 NOTE — Progress Notes (Signed)
Functional Pain Scale - descriptive words and definitions  Mild (2)   Noticeable when not distracted/no impact on ADL's/sleep only slightly affected and able to   use both passive and active distraction for comfort. Mild range order  Average Pain 2  Right handed. Right hand pain in index finger and numbness in ring and pinky finger

## 2023-02-15 NOTE — Procedures (Signed)
EMG & NCV Findings: Evaluation of the right median motor nerve showed prolonged distal onset latency (4.8 ms) and decreased conduction velocity (Elbow-Wrist, 47 m/s).  The right ulnar motor nerve showed prolonged distal onset latency (5.2 ms), reduced amplitude (0.9 mV), decreased conduction velocity (B Elbow-Wrist, 36 m/s), and decreased conduction velocity (A Elbow-B Elbow, 21 m/s).  The right median (across palm) sensory nerve showed no response (Palm) and prolonged distal peak latency (4.1 ms).  The right ulnar sensory nerve showed prolonged distal peak latency (8.0 ms), reduced amplitude (3.8 V), and decreased conduction velocity (Wrist-5th Digit, 18 m/s).  All remaining nerves (as indicated in the following tables) were within normal limits.    Needle evaluation of the right first dorsal interosseous muscle showed decreased insertional activity, widespread spontaneous activity, and diminished recruitment.  The right flexor carpi ulnaris muscle showed increased insertional activity, increased spontaneous activity, and diminished recruitment.  The right flexor digitorum profundus muscle showed increased insertional activity, moderately increased spontaneous activity, and diminished recruitment.  All remaining muscles (as indicated in the following table) showed no evidence of electrical instability.    Impression: The above electrodiagnostic study is ABNORMAL but complicated.  The study reveals evidence of a severe right ulnar nerve neuropathy affecting sensory and motor components. It also reveals evidence of a moderate right median nerve entrapment at the wrist affecting sensory and motor components.  Clinically, the exam is more consistent with an ulnar neuropathy and also given that the electrodiagnostic findings are much more severe for the ulnar nerve than the median nerve.  However, localizing the ulnar neuropathy is hard because the atrophy noted as well as not seeing conduction velocity drop  across the elbow.  There could be underlying peripheral polyneuropathy from the HIV.  The upcoming lower extremity study may elucidate that better.  Lastly, from an electrodiagnostic standpoint alone it would be hard to rule out a C8 radiculopathy but the cervical MRI prior to the recent surgery does not show any high-grade compression of the C8 nerve root.  There was moderate to severe foraminal narrowing bilaterally.  There is no significant electrodiagnostic evidence of any other focal nerve entrapment or brachial plexopathy.  Recommendations: 1.  Follow-up with referring physician.  Needs to complete lower extremity study at [facility] neurology on 8/21. 2.  Continue current management of symptoms. 3.  Suggest surgical evaluation.  ___________________________ Casey Brennan FAAPMR Board Certified, American Board of Physical Medicine and Rehabilitation    Nerve Conduction Studies Anti Sensory Summary Table   Stim Site NR Peak (ms) Norm Peak (ms) P-T Amp (V) Norm P-T Amp Site1 Site2 Delta-P (ms) Dist (cm) Vel (m/s) Norm Vel (m/s)  Right Median Acr Palm Anti Sensory (2nd Digit)  29.4C  Wrist    *4.1 <3.6 21.0 >10 Wrist Palm  0.0    Palm *NR  <2.0          Right Radial Anti Sensory (Base 1st Digit)  29.5C  Wrist    2.5 <3.1 24.2  Wrist Base 1st Digit 2.5 0.0    Right Ulnar Anti Sensory (5th Digit)  29.9C  Wrist    *8.0 <3.7 *3.8 >15.0 Wrist 5th Digit 8.0 14.0 *18 >38   Motor Summary Table   Stim Site NR Onset (ms) Norm Onset (ms) O-P Amp (mV) Norm O-P Amp Site1 Site2 Delta-0 (ms) Dist (cm) Vel (m/s) Norm Vel (m/s)  Right Median Motor (Abd Poll Brev)  29.7C  Wrist    *4.8 <4.2 5.1 >5 Elbow Wrist 4.9  23.0 *47 >50  Elbow    9.7  3.8         Right Ulnar Motor (Abd Dig Min)  29.9C  Wrist    *5.2 <4.2 *0.9 >3 B Elbow Wrist 6.3 22.5 *36 >53  B Elbow    11.5  0.2  A Elbow B Elbow 5.3 11.0 *21 >53  A Elbow    16.8  0.7          EMG   Side Muscle Nerve Root Ins Act Fibs Psw Amp Dur  Poly Recrt Int Dennie Bible Comment  Right 1stDorInt Ulnar C8-T1 *Decr *4+ *4+ Nml Nml 0 *Reduced Nml pos MUAP  Right Abd Poll Brev Median C8-T1 Nml Nml Nml Nml Nml 0 Nml Nml   Right ExtDigCom   Nml Nml Nml Nml Nml 0 Nml Nml   Right Triceps Radial C6-7-8 Nml Nml Nml Nml Nml 0 Nml Nml   Right Deltoid Axillary C5-6 Nml Nml Nml Nml Nml 0 Nml Nml   Right FlexCarpiUln Ulnar C8,T1 *Incr *3+ *3+ Nml Nml 0 *Reduced Nml   Right FlexDigProf Ulnar C8,T1 *Incr *2+ *2+ Nml Nml 0 *Reduced Nml     Nerve Conduction Studies Anti Sensory Left/Right Comparison   Stim Site L Lat (ms) R Lat (ms) L-R Lat (ms) L Amp (V) R Amp (V) L-R Amp (%) Site1 Site2 L Vel (m/s) R Vel (m/s) L-R Vel (m/s)  Median Acr Palm Anti Sensory (2nd Digit)  29.4C  Wrist  *4.1   21.0  Wrist Palm     Palm             Radial Anti Sensory (Base 1st Digit)  29.5C  Wrist  2.5   24.2  Wrist Base 1st Digit     Ulnar Anti Sensory (5th Digit)  29.9C  Wrist  *8.0   *3.8  Wrist 5th Digit  *18    Motor Left/Right Comparison   Stim Site L Lat (ms) R Lat (ms) L-R Lat (ms) L Amp (mV) R Amp (mV) L-R Amp (%) Site1 Site2 L Vel (m/s) R Vel (m/s) L-R Vel (m/s)  Median Motor (Abd Poll Brev)  29.7C  Wrist  *4.8   5.1  Elbow Wrist  *47   Elbow  9.7   3.8        Ulnar Motor (Abd Dig Min)  29.9C  Wrist  *5.2   *0.9  B Elbow Wrist  *36   B Elbow  11.5   0.2  A Elbow B Elbow  *21   A Elbow  16.8   0.7           Waveforms:

## 2023-02-16 DIAGNOSIS — M5412 Radiculopathy, cervical region: Secondary | ICD-10-CM | POA: Diagnosis not present

## 2023-02-20 ENCOUNTER — Ambulatory Visit: Payer: 59 | Admitting: Internal Medicine

## 2023-02-20 NOTE — H&P (Signed)
PREOPERATIVE H&P  Chief Complaint: LEFT SHOULDER ROTATOR CUFF TEAR  HPI: Casey Brennan is a 62 y.o. male who presents with a diagnosis of LEFT SHOULDER ROTATOR CUFF TEAR. Symptoms are rated as moderate to severe, and have been worsening.  This is significantly impairing activities of daily living.  He has elected for surgical management.   Past Medical History:  Diagnosis Date   Asthma    very rare   Chronic anemia    normocytic   Chronic folliculitis    Chronic kidney disease    Stage 2   COPD (chronic obstructive pulmonary disease) (HCC)    Dyspnea    Environmental and seasonal allergies    Glaucoma    right   H/O pericarditis    2010--  myopercarditis--  resolved   Headache(784.0)    HX SEVERE FRONTAL HA'S   Hiatal hernia    History of gastric ulcer    nonbleeding gastric ulcer 58//24   History of kidney stones    History of MRSA infection 2010   infected boil   HIV (human immunodeficiency virus infection) (HCC) 1988   Hypertension    Hypertensive cardiomyopathy (HCC)    EF 37%, normal coronaries 04/2020 CCTA; LVEF 60-65% 03/2022 echo   Lytic bone lesion of hip    WORK-UP DONE BY ONCOLOGIST DR HA --  NOT MALIGNANT   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   Pneumonia    hx of   Post concussion syndrome    resolved   Prostate cancer (HCC) 04/25/2013   gleason 3+3=6, volume 30 gm   Viral meningitis    06/2021   Past Surgical History:  Procedure Laterality Date   ACDCF  09/29/2020   ANTERIOR CERVICAL DECOMPRESSION/DISCECTOMY FUSION 4 LEVELS N/A 09/29/2020   Procedure: ANTERIOR CERVICAL DECOMPRESSION FUSION CERVICAL 3-CERVICAL 4 , CERVICAL 4 - CERVICAL 5, CERVICAL 5 - CERVICAL 6 WITH INSTRUMENTATION AND ALLOGRAFT;  Surgeon: Estill Bamberg, MD;  Location: [facility] OR;  Service: Orthopedics;  Laterality: N/A;   CARDIAC CATHETERIZATION  03-23-2009  DR COOPER   NORMAL CORONARY ARTERIES   CHOLECYSTECTOMY N/A 11/06/2014   Procedure: LAPAROSCOPIC CHOLECYSTECTOMY WITH  INTRAOPERATIVE CHOLANGIOGRAM;  Surgeon: Jimmye Norman, MD;  Location: [facility] OR;  Service: General;  Laterality: N/A;   COLONOSCOPY  12/26/2011   Procedure: COLONOSCOPY;  Surgeon: Shirley Friar, MD;  Location: [facility] ENDOSCOPY;  Service: Endoscopy;  Laterality: N/A;   COLONOSCOPY     COLONOSCOPY WITH PROPOFOL N/A 07/29/2014   Procedure: COLONOSCOPY WITH PROPOFOL;  Surgeon: Shirley Friar, MD;  Location: [facility]OSCOPY;  Service: Endoscopy;  Laterality: N/A;   CYSTOSCOPY N/A 05/18/2018   Procedure: CYSTOSCOPY REMOVAL FOREIGN BODY;  Surgeon: Ihor Gully, MD;  Location: [facility] ORS;  Service: Urology;  Laterality: N/A;   CYSTOSCOPY/RETROGRADE/URETEROSCOPY Bilateral 04/25/2013   Procedure: CYSTOSCOPY/ BILATERAL RETROGRADES; BLADDER BIOPSIES;  Surgeon: Sebastian Ache, MD;  Location: [facility];  Service: Urology;  Laterality: Bilateral;   DENTAL EXAMINATION UNDER ANESTHESIA     ESOPHAGOGASTRODUODENOSCOPY  12/26/2011   Procedure: ESOPHAGOGASTRODUODENOSCOPY (EGD);  Surgeon: Shirley Friar, MD;  Location: [facility];  Service: Endoscopy;  Laterality: N/A;   ESOPHAGOGASTRODUODENOSCOPY (EGD) WITH PROPOFOL N/A 07/29/2014   Procedure: ESOPHAGOGASTRODUODENOSCOPY (EGD) WITH PROPOFOL;  Surgeon: Shirley Friar, MD;  Location: [facility] ENDOSCOPY;  Service: Endoscopy;  Laterality: N/A;   EXCISION CHRONIC LEFT BREAST ABSCESS  09-21-2010   EXCISIONAL BX LEFT BREAST MASS/  I  &  D LEFT BREAST ABSCESS  03-24-2009   KNEE ARTHROSCOPY Right 1985  left axilla biopsy  04/2013   left hip biopsy  10/2012   LYMPHADENECTOMY Bilateral 08/18/2013   Procedure: LYMPHADENECTOMY "PELVIC LYMPH NODE DISSECTION";  Surgeon: Sebastian Ache, MD;  Location: [facility] ORS;  Service: Urology;  Laterality: Bilateral;   POSTERIOR CERVICAL FUSION/FORAMINOTOMY N/A 11/30/2022   Procedure: POSTERIOR DECOMPRESSION AND FUSION CERVICAL 4- CERVICAL 5, CERVICAL 5- CERVICAL 6, CERVICAL 6- CERVICAL 7, CERVICAL 7- THORACIC 1 WITH INSTRUMENTATION  AND ALLOGRAFT;  Surgeon: Estill Bamberg, MD;  Location: [facility] OR;  Service: Orthopedics;  Laterality: N/A;   PROSTATE BIOPSY N/A 04/25/2013   Procedure: BIOPSY TRANSRECTAL ULTRASONIC PROSTATE (TUBP);  Surgeon: Sebastian Ache, MD;  Location: [facility];  Service: Urology;  Laterality: N/A;   ROBOT ASSISTED LAPAROSCOPIC RADICAL PROSTATECTOMY N/A 08/18/2013   Procedure: ROBOTIC ASSISTED LAPAROSCOPIC RADICAL PROSTATECTOMY;  Surgeon: Sebastian Ache, MD;  Location: [facility];  Service: Urology;  Laterality: N/A;   TOTAL KNEE ARTHROPLASTY Right 08/01/2016   08/17/16   TOTAL KNEE ARTHROPLASTY Right 08/01/2016   Procedure: TOTAL KNEE ARTHROPLASTY;  Surgeon: Sheral Apley, MD;  Location: [facility] OR;  Service: Orthopedics;  Laterality: Right;   UPPER GASTROINTESTINAL ENDOSCOPY     UPPER LEG SOFT TISSUE BIOPSY Left 2012   lthigh   Social History   Socioeconomic History   Marital status: Single    Spouse name: Not on file   Number of children: 1   Years of education: College   Highest education level: Bachelor's degree (e.g., BA, AB, BS)  Occupational History   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 declined condoms  Other Topics Concern   Not on file  Social History Narrative   Sister April stays with him currently although he hopes to return to truck driving. He has decreased vision  due to head injury. Sister is starting medical school next year.   Patient is divorced and lives with his sister.   Patient has one adult son.   Patient is part-time, driving a truck.   Patient has a college education.   Patient is right-handed.   Patient drinks 2-3 cups of coffee and a 4-5 cups of tea.   Social Determinants of Health   Financial Resource Strain: Low Risk  (10/03/2022)   Overall Financial Resource Strain (CARDIA)    Difficulty of Paying Living Expenses: Not very hard  Food Insecurity: No Food Insecurity (12/04/2022)   Hunger Vital Sign    Worried About Running Out of Food in the Last Year: Never true    Ran Out of Food in the Last Year: Never true  Transportation Needs: No Transportation Needs (12/04/2022)   PRAPARE - Administrator, Civil Service (Medical): No    Lack of Transportation (Non-Medical): No  Physical Activity: Sufficiently Active (10/03/2022)  Exercise Vital Sign    Days of Exercise per Week: 7 days    Minutes of Exercise per Session: 30 min  Stress: No Stress Concern Present (10/03/2022)   [employer] of Occupational Health - Occupational Stress Questionnaire    Feeling of Stress : Not at all  Social Connections: Unknown (10/03/2022)   Social Connection and Isolation Panel [NHANES]    Frequency of Communication with Friends and Family: More than three times a week    Frequency of Social Gatherings with Friends and Family: More than three times a week    Attends Religious Services: More than 4 times per year    Active Member of [employer] or Organizations: Yes    Attends Engineer, structural: More than 4 times per year    Marital Status: Patient declined   Family History  Problem Relation Age of Onset   Stroke Father    Diabetes Father    Cancer Father        brain cancer   Asthma Father    Hypertension Sister    Cancer Maternal Uncle        prostate cancer   Asthma Sister    Colon cancer Neg Hx    Colon polyps Neg Hx     Esophageal cancer Neg Hx    Stomach cancer Neg Hx    Rectal cancer Neg Hx    Allergies  Allergen Reactions   Amoxapine Shortness Of Breath   Chocolate Shortness Of Breath and Other (See Comments)    Asthma attack, welts   Ciprofloxacin Anaphylaxis, Dermatitis, Itching, Nausea And Vomiting, Shortness Of Breath, Swelling and Hives   Descovy [Emtricitabine-Tenofovir Af] Shortness Of Breath    Nasuea, vomiiting, diarrhea, sob, whelps, throat closes   Doxycycline Hyclate Shortness Of Breath and Swelling   Genvoya [Elviteg-Cobic-Emtricit-Tenofaf] Shortness Of Breath    Nausea, vomitting, diarreha, sob, rash, throat closing   Morphine And Codeine Shortness Of Breath, Itching and Other (See Comments)     chest pain   Other Anaphylaxis   Oxycodone-Acetaminophen Shortness Of Breath   Penicillins Shortness Of Breath and Rash    Has patient had a PCN reaction causing immediate rash, facial/tongue/throat swelling, SOB or lightheadedness with hypotension: Yes Has patient had a PCN reaction causing severe rash involving mucus membranes or skin necrosis: No Has patient had a PCN reaction that required hospitalization No Has patient had a PCN reaction occurring within the last 10 years: Yes If all of the above answers are "NO", then may proceed with Cephalosporin use.   Sulfa Antibiotics Anaphylaxis and Other (See Comments)   Tomato Shortness Of Breath   Vancomycin Shortness Of Breath and Other (See Comments)   Darunavir Itching   Amlodipine Other (See Comments)    swelling   Carvedilol     LEG SWELLING, DIZZNESS   Flagyl [Metronidazole] Itching   Meloxicam    Milk-Related Compounds    Morphine Other (See Comments)   Oxycodone Hcl Other (See Comments)   Penicillin G Other (See Comments)   Toradol [Ketorolac Tromethamine] Nausea And Vomiting    itching   Acetaminophen Rash and Other (See Comments)    Pt tolerates percocet and also plain APAP without rash   Tramadol Itching, Nausea And  Vomiting and Other (See Comments)   Prior to Admission medications   Medication Sig Start Date End Date Taking? Authorizing Provider  albuterol (PROVENTIL) (2.5 MG/3ML) 0.083% nebulizer solution Take 3 mLs (2.5 mg total) by nebulization every 6 (six) hours as  needed for wheezing or shortness of breath. 01/25/22   Ginnie Smart, MD  albuterol (VENTOLIN HFA) 108 (90 Base) MCG/ACT inhaler Inhale 2 puffs into the lungs every 6 (six) hours as needed for wheezing or shortness of breath. 12/28/22   Corwin Levins, MD  ALPRAZolam Prudy Feeler) 0.5 MG tablet TAKE 1 TABLET(0.5 MG) BY MOUTH TWICE DAILY AS NEEDED FOR SLEEP 01/01/23   Dohmeier, Porfirio Mylar, MD  azelastine (ASTELIN) 0.1 % nasal spray Place 2 sprays into both nostrils in the morning. Use in each nostril as directed    [provider]  BIKTARVY 50-200-25 MG TABS tablet TAKE 1 TABLET BY MOUTH DAILY 08/02/22   Ginnie Smart, MD  Budeson-Glycopyrrol-Formoterol (BREZTRI AEROSPHERE) 160-9-4.8 MCG/ACT AERO INHALE 2 PUFFS INTO THE LUNGS IN THE MORNING AND AT BEDTIME 12/14/22   Oretha Milch, MD  cetirizine (ZYRTEC) 10 MG tablet Take 1 tablet (10 mg total) by mouth daily. 11/11/21 01/18/23  Corwin Levins, MD  Cholecalciferol (VITAMIN D3) 250 MCG (10000 UT) capsule Take 1 tablet by mouth daily. [employer] 04/14/21   [provider]  cyclobenzaprine (FLEXERIL) 10 MG tablet Take 1 tablet (10 mg total) by mouth at bedtime. 11/16/22   Dohmeier, Porfirio Mylar, MD  diphenhydrAMINE (BENADRYL) 25 MG tablet Take 1 tablet (25 mg total) by mouth every 6 (six) hours as needed. Patient taking differently: Take 25 mg by mouth every 6 (six) hours as needed for sleep, itching or allergies. 04/11/21   Venter, Margaux, PA-C  dorzolamide (TRUSOPT) 2 % ophthalmic solution Place 1 drop into both eyes 2 (two) times daily. 06/07/22   [provider]  EPINEPHrine 0.3 mg/0.3 mL IJ SOAJ injection Inject 0.3 mg into the muscle as needed for anaphylaxis. 03/24/22   Corwin Levins, MD  gabapentin (NEURONTIN) 300 MG capsule TAKE 1 CAPSULE(300 MG) BY MOUTH FOUR TIMES DAILY AS NEEDED FOR PAIN 01/01/23   Dohmeier, Porfirio Mylar, MD  Glucos-Chond-Hyal Ac-Ca Fructo (MOVE FREE JOINT HEALTH ADVANCE PO) Take 1 tablet by mouth daily.    [provider]  HYDROcodone-acetaminophen (NORCO) 7.5-325 MG tablet  12/15/22   [provider]  hydrOXYzine (ATARAX) 50 MG tablet TAKE 1 TABLET(50 MG) BY MOUTH TWICE DAILY 01/01/23   Patwardhan, Anabel Bene, MD  meclizine (ANTIVERT) 25 MG tablet Take 1 tablet (25 mg total) by mouth daily as needed. 11/11/21   Corwin Levins, MD  methocarbamol (ROBAXIN) 500 MG tablet Take 500 mg by mouth 4 (four) times daily.    [provider]  methocarbamol (ROBAXIN) 500 MG tablet Take 1-2 tablets (500-1,000 mg total) by mouth every 6 (six) hours as needed for muscle spasms. 12/01/22   Estill Bamberg, MD  metoprolol succinate (TOPROL-XL) 100 MG 24 hr tablet TAKE 1 TABLET(100 MG) BY MOUTH DAILY WITH OR IMMEDIATELY FOLLOWING A MEAL Patient taking differently: Take 100 mg by mouth daily. 06/07/22   Yates Decamp, MD  montelukast (SINGULAIR) 10 MG tablet Take 10 mg by mouth daily. 11/15/20   [provider]  Multiple Vitamin (MULTIVITAMIN WITH MINERALS) TABS tablet Take 1 tablet by mouth daily after breakfast.     [provider]  mupirocin ointment (BACTROBAN) 2 % APPLY EXTERNALLY TO THE AFFECTED AREA TWICE DAILY FOR 3 WEEKS, PLACE COTTON BETWEEN TOES TO ALLOW BETTER AERATION AS NEEDED Patient taking differently: Apply 1 application  topically daily as needed (affected toes). 04/20/21   Ginnie Smart, MD  nitrofurantoin (MACRODANTIN) 50 MG capsule Take 1 capsule (50 mg total) by mouth  4 (four) times daily. 01/28/23   Corwin Levins, MD  nitroGLYCERIN (NITROSTAT) 0.4 MG SL tablet Place 1 tablet (0.4 mg total) under the tongue every 5 (five) minutes as needed for chest pain. 02/08/22 11/30/22  Patwardhan, Anabel Bene, MD  ondansetron (ZOFRAN-ODT)  4 MG disintegrating tablet Take 1 tablet (4 mg total) by mouth every 8 (eight) hours as needed for nausea or vomiting. Patient taking differently: Take 4 mg by mouth every 4 (four) hours as needed for nausea or vomiting. 07/17/22   Pincus Sanes, MD  OVER THE COUNTER MEDICATION Take 2 capsules by mouth daily. Zhou: thyroid supplement    [provider]  pantoprazole (PROTONIX) 40 MG tablet Take 1 tablet (40 mg total) by mouth 2 (two) times daily before a meal. 11/08/22   Iva Boop, MD  QNASL 80 MCG/ACT AERS USE 2 SPRAYS IN [facility] NOSTRIL DAILY Patient taking differently: Place 2 sprays into both nostrils in the morning and at bedtime. 11/16/22   Corwin Levins, MD  QULIPTA 30 MG TABS Take 1 tablet (30 mg total) by mouth daily. 11/16/22   Dohmeier, Porfirio Mylar, MD  rosuvastatin (CRESTOR) 20 MG tablet TAKE 1 TABLET(20 MG) BY MOUTH DAILY Patient taking differently: Take 20 mg by mouth daily. 08/28/22   Corwin Levins, MD  tizanidine (ZANAFLEX) 6 MG capsule Take 6 mg by mouth 3 (three) times daily.    [provider]  torsemide (DEMADEX) 20 MG tablet Take 10 mg by mouth See admin instructions. Tuesday Thursday Saturday    [provider]  Ubrogepant (UBRELVY) 100 MG TABS Take 1 tablet (100 mg total) by mouth See admin instructions. Every 2 hours as needed. 12/21/22   Corwin Levins, MD  zolpidem (AMBIEN) 5 MG tablet Take 1-2 tablets (5-10 mg total) by mouth at bedtime as needed for sleep. Patient not taking: Reported on 04/23/2019 12/21/16 05/19/19  Kathlynn Grate, DO     Positive ROS: All other systems have been reviewed and were otherwise negative with the exception of those mentioned in the HPI and as above.  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 Os acromiale with degenerative changes across the synchondrosis. Subacromial spurring and greater tuberosity irregularity, findings which can be seen in the setting of chronic distal rotator cuff disease   Assessment: LEFT SHOULDER ROTATOR CUFF TEAR  Plan: Plan for Procedure(s): SHOULDER ARTHROSCOPY WITH ROTATOR CUFF REPAIR AND SUBACROMIAL DECOMPRESSION, DISTAL CLAVICLECTOMY, BICEPS TENODESIS  The risks benefits and alternatives were discussed with the patient including but not limited to the risks of nonoperative treatment, versus surgical intervention including infection, bleeding, nerve injury,  blood clots, cardiopulmonary complications, morbidity, mortality, among others, and they were willing to proceed.   Weightbearing: NWB LUE Orthopedic devices: sling Showering: POD 3 Dressing: reinforce PRN Medicines: ASA, Oxy, Tylenol, Mobic, Baclofen, Zofran   Discharge: home Follow up:     Marzetta Board Office [phone] 02/20/2023 12:29 PM

## 2023-02-20 NOTE — Progress Notes (Signed)
Casey Brennan - 62 y.o. male MRN [id number]  Date of birth: 1961/06/16  Office Visit Note: Visit Date: 02/14/2023 PCP: Corwin Levins, MD Referred by: Corwin Levins, MD  Subjective: Chief Complaint  Patient presents with   Right Hand - Numbness, Pain   HPI: Casey Brennan is a 62 y.o. male who comes in today at the request of Dr. Bonner Puna for evaluation and management of chronic, worsening and severe pain, numbness and tingling in the Right upper extremities.  Patient is Right hand dominant.  Very complicated history with the patient complains today of mainly right hand numbness tingling and weakness with atrophy of the FDI and hand intrinsics.  The paresthesias are mainly in the right fourth and fifth digit.  Occasionally index finger.  He is present with his sister [name] who I actually used to see a long time ago as a patient.  She provides a lot of the history.  He endorses some improvement over time with strength and the atrophy itself.  He notes some pain radiating up into the elbow on the right.  He has some mild left-sided symptoms but his history is such that he had 2 prior cervical surgeries.  1 in 2022 by Dr. Estill Bamberg.  This was an ACDF C3-6.  In May of this year with continued complaints of this more ulnar/C8 distribution symptoms underwent posterior decompression for foraminal narrowing.  He reports that surgery improved his neck and shoulder and arm pain but this right sided symptoms of the hand persist today.  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  He is scheduled to have a electrodiagnostic study of the lower limbs performed at [facility] on 8/21 I believe.  He does see Dr. Vickey Huger for headache and other neurologic complaints.  They were going to do nerve study of the arms and legs but agreed to just do the legs since we had been scheduled to see him for his right upper  extremity.  Prior electrodiagnostic study by Dr. Anne Hahn in their office from 2012 scanned into the chart showed a mild peripheral polyneuropathy but also mild compression at the wrist of the median nerve and ulnar nerve at the elbow.  He also has an upcoming shoulder surgery at [facility]   I spent more than 30 minutes speaking face-to-face with the patient with 50% of the time in counseling and discussing coordination of care.      Review of Systems  Musculoskeletal:  Positive for joint pain and neck pain.  Neurological:  Positive for tingling and weakness.  All other systems reviewed and are negative.  Otherwise per HPI.  Assessment & Plan: Visit Diagnoses:    ICD-10-CM   1. Paresthesia of skin  R20.2 NCV with EMG (electromyography)    2. Pain in right elbow  M25.521     3. Pain in right hand  M79.641     4. Weakness  R53.1     5. S/P cervical spinal fusion  Z98.1        Plan: Impression: Complicated clinical picture of patient with pain numbness and tingling chronically but more recent weakness with atrophy of the first dorsal interosseous and hand intrinsics.  Clinical exam more consistent with a severe ulnar neuropathy.  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  Electrodiagnostic study performed today.  The above electrodiagnostic study is  ABNORMAL but complicated.  The study reveals evidence of a severe right ulnar nerve neuropathy affecting sensory and motor components. It also reveals evidence of a moderate right median nerve entrapment at the wrist affecting sensory and motor components.  Clinically, the exam is more consistent with an ulnar neuropathy and also given that the electrodiagnostic findings are much more severe for the ulnar nerve than the median nerve.  However, localizing the ulnar neuropathy is hard because the atrophy noted as well as not seeing conduction velocity drop across the elbow.  There could be  underlying peripheral polyneuropathy from the HIV.  The upcoming lower extremity study may elucidate that better.  Lastly, from an electrodiagnostic standpoint alone it would be hard to rule out a C8 radiculopathy but the cervical MRI prior to the recent surgery does not show any high-grade compression of the C8 nerve root.  There was moderate to severe foraminal narrowing bilaterally.  There is no significant electrodiagnostic evidence of any other focal nerve entrapment or brachial plexopathy.  Recommendations: 1.  Follow-up with referring physician.  Needs to complete lower extremity study at [facility] neurology on 8/21. 2.  Continue current management of symptoms. 3.  Suggest surgical evaluation.  Meds & Orders: No orders of the defined types were placed in this encounter.   Orders Placed This Encounter  Procedures   NCV with EMG (electromyography)    Follow-up: No follow-ups on file.   Procedures: No procedures performed  EMG & NCV Findings: Evaluation of the right median motor nerve showed prolonged distal onset latency (4.8 ms) and decreased conduction velocity (Elbow-Wrist, 47 m/s).  The right ulnar motor nerve showed prolonged distal onset latency (5.2 ms), reduced amplitude (0.9 mV), decreased conduction velocity (B Elbow-Wrist, 36 m/s), and decreased conduction velocity (A Elbow-B Elbow, 21 m/s).  The right median (across palm) sensory nerve showed no response (Palm) and prolonged distal peak latency (4.1 ms).  The right ulnar sensory nerve showed prolonged distal peak latency (8.0 ms), reduced amplitude (3.8 V), and decreased conduction velocity (Wrist-5th Digit, 18 m/s).  All remaining nerves (as indicated in the following tables) were within normal limits.    Needle evaluation of the right first dorsal interosseous muscle showed decreased insertional activity, widespread spontaneous activity, and diminished recruitment.  The right flexor carpi ulnaris muscle showed increased  insertional activity, increased spontaneous activity, and diminished recruitment.  The right flexor digitorum profundus muscle showed increased insertional activity, moderately increased spontaneous activity, and diminished recruitment.  All remaining muscles (as indicated in the following table) showed no evidence of electrical instability.    Impression: The above electrodiagnostic study is ABNORMAL but complicated.  The study reveals evidence of a severe right ulnar nerve neuropathy affecting sensory and motor components. It also reveals evidence of a moderate right median nerve entrapment at the wrist affecting sensory and motor components.  Clinically, the exam is more consistent with an ulnar neuropathy and also given that the electrodiagnostic findings are much more severe for the ulnar nerve than the median nerve.  However, localizing the ulnar neuropathy is hard because the atrophy noted as well as not seeing conduction velocity drop across the elbow.  There could be underlying peripheral polyneuropathy from the HIV.  The upcoming lower extremity study may elucidate that better.  Lastly, from an electrodiagnostic standpoint alone it would be hard to rule out a C8 radiculopathy but the cervical MRI prior to the recent surgery does not show any high-grade compression of the C8 nerve root.  There was moderate to severe foraminal narrowing bilaterally.  There is no significant electrodiagnostic evidence of any other focal nerve entrapment or brachial plexopathy.  Recommendations: 1.  Follow-up with referring physician.  Needs to complete lower extremity study at [name] neurology on 8/21. 2.  Continue current management of symptoms. 3.  Suggest surgical evaluation.  ___________________________ Naaman Plummer FAAPMR Board Certified, American Board of Physical Medicine and Rehabilitation    Nerve Conduction Studies Anti Sensory Summary Table   Stim Site NR Peak (ms) Norm Peak (ms) P-T Amp  (V) Norm P-T Amp Site1 Site2 Delta-P (ms) Dist (cm) Vel (m/s) Norm Vel (m/s)  Right Median Acr Palm Anti Sensory (2nd Digit)  29.4C  Wrist    *4.1 <3.6 21.0 >10 Wrist Palm  0.0    Palm *NR  <2.0          Right Radial Anti Sensory (Base 1st Digit)  29.5C  Wrist    2.5 <3.1 24.2  Wrist Base 1st Digit 2.5 0.0    Right Ulnar Anti Sensory (5th Digit)  29.9C  Wrist    *8.0 <3.7 *3.8 >15.0 Wrist 5th Digit 8.0 14.0 *18 >38   Motor Summary Table   Stim Site NR Onset (ms) Norm Onset (ms) O-P Amp (mV) Norm O-P Amp Site1 Site2 Delta-0 (ms) Dist (cm) Vel (m/s) Norm Vel (m/s)  Right Median Motor (Abd Poll Brev)  29.7C  Wrist    *4.8 <4.2 5.1 >5 Elbow Wrist 4.9 23.0 *47 >50  Elbow    9.7  3.8         Right Ulnar Motor (Abd Dig Min)  29.9C  Wrist    *5.2 <4.2 *0.9 >3 B Elbow Wrist 6.3 22.5 *36 >53  B Elbow    11.5  0.2  A Elbow B Elbow 5.3 11.0 *21 >53  A Elbow    16.8  0.7          EMG   Side Muscle Nerve Root Ins Act Fibs Psw Amp Dur Poly Recrt Int Dennie Bible Comment  Right 1stDorInt Ulnar C8-T1 *Decr *4+ *4+ Nml Nml 0 *Reduced Nml pos MUAP  Right Abd Poll Brev Median C8-T1 Nml Nml Nml Nml Nml 0 Nml Nml   Right ExtDigCom   Nml Nml Nml Nml Nml 0 Nml Nml   Right Triceps Radial C6-7-8 Nml Nml Nml Nml Nml 0 Nml Nml   Right Deltoid Axillary C5-6 Nml Nml Nml Nml Nml 0 Nml Nml   Right FlexCarpiUln Ulnar C8,T1 *Incr *3+ *3+ Nml Nml 0 *Reduced Nml   Right FlexDigProf Ulnar C8,T1 *Incr *2+ *2+ Nml Nml 0 *Reduced Nml     Nerve Conduction Studies Anti Sensory Left/Right Comparison   Stim Site L Lat (ms) R Lat (ms) L-R Lat (ms) L Amp (V) R Amp (V) L-R Amp (%) Site1 Site2 L Vel (m/s) R Vel (m/s) L-R Vel (m/s)  Median Acr Palm Anti Sensory (2nd Digit)  29.4C  Wrist  *4.1   21.0  Wrist Palm     Palm             Radial Anti Sensory (Base 1st Digit)  29.5C  Wrist  2.5   24.2  Wrist Base 1st Digit     Ulnar Anti Sensory (5th Digit)  29.9C  Wrist  *8.0   *3.8  Wrist 5th Digit  *18    Motor Left/Right  Comparison   Stim Site L Lat (ms) R Lat (ms) L-R Lat (ms) L Amp (mV) R Amp (mV)  L-R Amp (%) Site1 Site2 L Vel (m/s) R Vel (m/s) L-R Vel (m/s)  Median Motor (Abd Poll Brev)  29.7C  Wrist  *4.8   5.1  Elbow Wrist  *47   Elbow  9.7   3.8        Ulnar Motor (Abd Dig Min)  29.9C  Wrist  *5.2   *0.9  B Elbow Wrist  *36   B Elbow  11.5   0.2  A Elbow B Elbow  *21   A Elbow  16.8   0.7           Waveforms:             Clinical History: MRI CERVICAL SPINE WITHOUT AND WITH CONTRAST   TECHNIQUE: Multiplanar and multiecho pulse sequences of the cervical spine, to include the craniocervical junction and cervicothoracic junction, were obtained without and with intravenous contrast.   CONTRAST:  8mL GADAVIST GADOBUTROL 1 MMOL/ML IV SOLN   COMPARISON:  Cervical spine CT 12/06/2022   Cervical spine MRI 10/10/2022   FINDINGS: Alignment: Physiologic.   Vertebrae: C3-C6 ACDF. C4-T1 posterior instrumented fusion with transpedicular screws at the C4, C5, C7 and T1 levels. The posterior fusion is new since 10/10/2022. there is no abnormal bone marrow signal.   Cord: Normal signal and morphology.   Posterior Fossa, vertebral arteries, paraspinal tissues: Dorsal postsurgical changes at C4-T1 with a small amount of fluid in the operative bed but no specific findings of infection.   Disc levels:   C1-2: Unremarkable.   C2-3: Small disc bulge with uncovertebral hypertrophy and mild bilateral facet hypertrophy. There is no spinal canal stenosis. Unchanged mild left neural foraminal stenosis.   C3-4: ACDF. There is no spinal canal stenosis. No neural foraminal stenosis.   C4-5: ACDF and posterior fusion. There is no spinal canal stenosis. No neural foraminal stenosis.   C5-6: ACDF and posterior fusion. Bilateral uncovertebral hypertrophy. There is no spinal canal stenosis. Mild right and severe left neural foraminal stenosis.   C6-7: Intermediate sized disc osteophyte complex.  There is no spinal canal stenosis. Moderate right and severe left neural foraminal stenosis.   C7-T1: Posterior fusion. There is no spinal canal stenosis. No neural foraminal stenosis.   IMPRESSION: 1. Status post C3-C6 ACDF and C4-T1 posterior instrumented fusion. No spinal canal stenosis. 2. Severe left C5-6 and C6-7 neural foraminal stenosis. 3. Moderate right C6-7 neural foraminal stenosis.     Electronically Signed   By: Deatra Robinson M.D.   On: 12/06/2022 23:25   He reports that he quit smoking about 12 years ago. His smoking use included cigars and cigarettes. He started smoking about 35 years ago. He has a 34.5 pack-year smoking history. He has never used smokeless tobacco.  Recent Labs    08/15/22 0735  HGBA1C <4.2    Objective:  VS:  HT:    WT:   BMI:     BP:   HR: bpm  TEMP: ( )  RESP:  Physical Exam Vitals and nursing note reviewed.  Constitutional:      General: He is not in acute distress.    Appearance: Normal appearance. He is well-developed.  HENT:     Head: Normocephalic and atraumatic.  Eyes:     Conjunctiva/sclera: Conjunctivae normal.     Pupils: Pupils are equal, round, and reactive to light.  Cardiovascular:     Rate and Rhythm: Normal rate.     Pulses: Normal pulses.     Heart sounds: Normal heart  sounds.  Pulmonary:     Effort: Pulmonary effort is normal. No respiratory distress.  Musculoskeletal:        General: Tenderness present. No swelling or deformity.     Cervical back: Neck supple. Tenderness present. No rigidity.     Right lower leg: No edema.     Left lower leg: No edema.     Comments: Inspection reveals atrophy of the right FDI and hand intrinsics but no atrophy of the bilateral APB . There is no swelling, color changes, allodynia or dystrophic changes. There is weakness on the right with finger abduction but good long finger flexion and good APB.  He does have clawing of the right hand and positive Benedictine sign. There is  decreased sensation to light touch in the right fifth digit and ulnar side of the fourth digit. There is a positive Froment's test on the right. There is a negative Phalen's test bilaterally. There is a negative Hoffmann's test bilaterally.  Skin:    General: Skin is warm and dry.     Findings: No erythema or rash.  Neurological:     General: No focal deficit present.     Mental Status: He is alert and oriented to person, place, and time.     Cranial Nerves: No cranial nerve deficit.     Sensory: Sensory deficit present.     Motor: Weakness present. No abnormal muscle tone.     Coordination: Coordination normal.     Gait: Gait normal.  Psychiatric:        Mood and Affect: Mood normal.        Behavior: Behavior normal.        Thought Content: Thought content normal.     Ortho Exam  Imaging: No results found.  Past Medical/Family/Surgical/Social History: Medications & Allergies reviewed per EMR, new medications updated. Patient Active Problem List   Diagnosis Date Noted   Pulmonary nodule 02/01/2023   Fever 01/28/2023   Adverse reaction to food 01/26/2023   Left shoulder pain 12/16/2022   Cervical disc disorder with radiculopathy of cervical region 11/30/2022   Malignant neoplasm metastatic to bone (HCC) 11/16/2022   Ulcer of toe, right, limited to breakdown of skin (HCC) 11/16/2022   Cardiomyopathy due to hypertension, without heart failure (HCC) 11/16/2022   Oropharyngeal dysphagia 10/26/2022   Bronchiectasis (HCC) 10/19/2022   Right renal mass 10/19/2022   Abnormal positron emission tomography (PET) scan 10/05/2022   Acute dysfunction of left eustachian tube 09/11/2022   Other chest pain 08/14/2022   External otitis of left ear 08/14/2022   COPD exacerbation (HCC) 06/14/2022   Precordial pain 02/08/2022   Finger pulp abscess, right 11/24/2021   Currently asymptomatic HIV infection, with history of HIV-related illness (HCC) 10/27/2021   Migraine with aura and without  status migrainosus, not intractable 10/27/2021   Abnormal urine odor 08/24/2021   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   Routine screening for STI (sexually transmitted infection) 06/29/2021   Viral meningitis, unspecified 06/23/2021   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 06/22/2021   Apnea present when patient supine 06/22/2021   Action tremor 06/22/2021   AKI (acute kidney injury) (HCC) 12/29/2020   Allergy to dairy product 12/27/2020   Angioneurotic edema 12/27/2020   Mild persistent asthma, uncomplicated 12/27/2020   Allergic rhinitis 12/27/2020   Vitamin D deficiency 12/27/2020   S/P cervical spinal fusion 11/11/2020   Nevus 11/11/2020   Myelopathy (HCC) 09/29/2020   Chronic obstructive pulmonary disease with acute  exacerbation (HCC) 07/13/2020   Encounter for well adult exam with abnormal findings 06/23/2020   Elevated TSH 05/20/2020   Mixed hyperlipidemia 05/12/2020   Generalized abdominal pain 07/07/2019   Peripheral neuropathy 06/02/2019   Healthcare maintenance 01/08/2019   Myopia of both eyes with astigmatism and presbyopia 09/13/2018   Tension headache 11/12/2017   Hepatitis B immune 06/06/2017   Chronic pain of left wrist 06/06/2017   Constipation 09/27/2016   Chronic renal insufficiency, stage 2 (mild) 05/22/2016   Skin lesion of right lower extremity 02/22/2016   Migraines 02/22/2016   Low back pain 02/22/2016   Osteoarthritis of right knee 01/11/2016   Kidney stones 10/25/2015   Sinusitis, chronic 05/14/2015   Cough 01/18/2015   GERD (gastroesophageal reflux disease) 07/29/2014   COPD GOLD III 07/22/2014   Hx of primary hypertension 04/27/2014   H/O prostate cancer    Lytic bone lesion of hip 08/22/2012   Cataract, nuclear 05/15/2012   Nuclear sclerotic cataract of both eyes 05/15/2012   Insomnia 05/11/2011   Essential hypertension 04/17/2011   HIV infection (HCC) 04/19/2009   Past Medical History:  Diagnosis Date   Asthma     very rare   Chronic anemia    normocytic   Chronic folliculitis    Chronic kidney disease    Stage 2   COPD (chronic obstructive pulmonary disease) (HCC)    Dyspnea    Environmental and seasonal allergies    Glaucoma    right   H/O pericarditis    2010--  myopercarditis--  resolved   Headache(784.0)    HX SEVERE FRONTAL HA'S   Hiatal hernia    History of gastric ulcer    nonbleeding gastric ulcer 58//24   History of kidney stones    History of MRSA infection 2010   infected boil   HIV (human immunodeficiency virus infection) (HCC) 1988   Hypertension    Hypertensive cardiomyopathy (HCC)    EF 37%, normal coronaries 04/2020 CCTA; LVEF 60-65% 03/2022 echo   Lytic bone lesion of hip    WORK-UP DONE BY ONCOLOGIST DR HA --  NOT MALIGNANT   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   Pneumonia    hx of   Post concussion syndrome    resolved   Prostate cancer (HCC) 04/25/2013   gleason 3+3=6, volume 30 gm   Viral meningitis    06/2021   Family History  Problem Relation Age of Onset   Stroke Father    Diabetes Father    Cancer Father        brain cancer   Asthma Father    Hypertension Sister    Cancer Maternal Uncle        prostate cancer   Asthma Sister    Colon cancer Neg Hx    Colon polyps Neg Hx    Esophageal cancer Neg Hx    Stomach cancer Neg Hx    Rectal cancer Neg Hx    Past Surgical History:  Procedure Laterality Date   ACDCF  09/29/2020   ANTERIOR CERVICAL DECOMPRESSION/DISCECTOMY FUSION 4 LEVELS N/A 09/29/2020   Procedure: ANTERIOR CERVICAL DECOMPRESSION FUSION CERVICAL 3-CERVICAL 4 , CERVICAL 4 - CERVICAL 5, CERVICAL 5 - CERVICAL 6 WITH INSTRUMENTATION AND ALLOGRAFT;  Surgeon: Estill Bamberg, MD;  Location: [facility] OR;  Service: Orthopedics;  Laterality: N/A;   CARDIAC CATHETERIZATION  03-23-2009  DR COOPER   NORMAL CORONARY ARTERIES   CHOLECYSTECTOMY N/A 11/06/2014   Procedure: LAPAROSCOPIC CHOLECYSTECTOMY WITH INTRAOPERATIVE CHOLANGIOGRAM;  Surgeon: Fayrene Fearing  Lindie Spruce, MD;  Location: [facility] OR;  Service: General;  Laterality: N/A;   COLONOSCOPY  12/26/2011   Procedure: COLONOSCOPY;  Surgeon: Shirley Friar, MD;  Location: [facility] ENDOSCOPY;  Service: Endoscopy;  Laterality: N/A;   COLONOSCOPY     COLONOSCOPY WITH PROPOFOL N/A 07/29/2014   Procedure: COLONOSCOPY WITH PROPOFOL;  Surgeon: Shirley Friar, MD;  Location: [facility] ENDOSCOPY;  Service: Endoscopy;  Laterality: N/A;   CYSTOSCOPY N/A 05/18/2018   Procedure: CYSTOSCOPY REMOVAL FOREIGN BODY;  Surgeon: Ihor Gully, MD;  Location: [facility]S;  Service: Urology;  Laterality: N/A;   CYSTOSCOPY/RETROGRADE/URETEROSCOPY Bilateral 04/25/2013   Procedure: CYSTOSCOPY/ BILATERAL RETROGRADES; BLADDER BIOPSIES;  Surgeon: Sebastian Ache, MD;  Location: [facility];  Service: Urology;  Laterality: Bilateral;   DENTAL EXAMINATION UNDER ANESTHESIA     ESOPHAGOGASTRODUODENOSCOPY  12/26/2011   Procedure: ESOPHAGOGASTRODUODENOSCOPY (EGD);  Surgeon: Shirley Friar, MD;  Location: [facility];  Service: Endoscopy;  Laterality: N/A;   ESOPHAGOGASTRODUODENOSCOPY (EGD) WITH PROPOFOL N/A 07/29/2014   Procedure: ESOPHAGOGASTRODUODENOSCOPY (EGD) WITH PROPOFOL;  Surgeon: Shirley Friar, MD;  Location[facility] ENDOSCOPY;  Service: Endoscopy;  Laterality: N/A;   EXCISION CHRONIC LEFT BREAST ABSCESS  09-21-2010   EXCISIONAL BX LEFT BREAST MASS/  I  &  D LEFT BREAST ABSCESS  03-24-2009   KNEE ARTHROSCOPY Right 1985   left axilla biopsy  04/2013   left hip biopsy  10/2012   LYMPHADENECTOMY Bilateral 08/18/2013   Procedure: LYMPHADENECTOMY "PELVIC LYMPH NODE DISSECTION";  Surgeon: Sebastian Ache, MD;  Location: [facility];  Service: Urology;  Laterality: Bilateral;   POSTERIOR CERVICAL FUSION/FORAMINOTOMY N/A 11/30/2022   Procedure: POSTERIOR DECOMPRESSION AND FUSION CERVICAL 4- CERVICAL 5, CERVICAL 5- CERVICAL 6, CERVICAL 6- CERVICAL 7, CERVICAL 7- THORACIC 1 WITH INSTRUMENTATION AND ALLOGRAFT;  Surgeon: Estill Bamberg, MD;   Location: [facility];  Service: Orthopedics;  Laterality: N/A;   PROSTATE BIOPSY N/A 04/25/2013   Procedure: BIOPSY TRANSRECTAL ULTRASONIC PROSTATE (TUBP);  Surgeon: Sebastian Ache, MD;  Location: [facility];  Service: Urology;  Laterality: N/A;   ROBOT ASSISTED LAPAROSCOPIC RADICAL PROSTATECTOMY N/A 08/18/2013   Procedure: ROBOTIC ASSISTED LAPAROSCOPIC RADICAL PROSTATECTOMY;  Surgeon: Sebastian Ache, MD;  Location: [facility];  Service: Urology;  Laterality: N/A;   TOTAL KNEE ARTHROPLASTY Right 08/01/2016   08/17/16   TOTAL KNEE ARTHROPLASTY Right 08/01/2016   Procedure: TOTAL KNEE ARTHROPLASTY;  Surgeon: Sheral Apley, MD;  Location: [facility] OR;  Service: Orthopedics;  Laterality: Right;   UPPER GASTROINTESTINAL ENDOSCOPY     UPPER LEG SOFT TISSUE BIOPSY Left 2012   lthigh   Social History   Occupational History   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 declined condoms

## 2023-02-21 ENCOUNTER — Encounter: Payer: 59 | Admitting: Neurology

## 2023-02-26 ENCOUNTER — Other Ambulatory Visit: Payer: Self-pay

## 2023-02-26 ENCOUNTER — Ambulatory Visit: Payer: 59 | Admitting: Neurology

## 2023-02-26 DIAGNOSIS — G43109 Migraine with aura, not intractable, without status migrainosus: Secondary | ICD-10-CM

## 2023-02-26 MED ORDER — UBRELVY 100 MG PO TABS
100.0000 mg | ORAL_TABLET | ORAL | 0 refills | Status: DC
Start: 2023-02-26 — End: 2023-03-13

## 2023-02-26 NOTE — Telephone Encounter (Signed)
Pt rescheduled for NCV/EMG for 02/28/23 at 9:30am

## 2023-02-27 ENCOUNTER — Ambulatory Visit: Payer: 59 | Admitting: Internal Medicine

## 2023-02-27 ENCOUNTER — Encounter: Payer: Self-pay | Admitting: Neurology

## 2023-02-28 ENCOUNTER — Encounter: Payer: 59 | Admitting: Neurology

## 2023-03-02 ENCOUNTER — Encounter: Payer: 59 | Admitting: Neurology

## 2023-03-06 ENCOUNTER — Ambulatory Visit: Payer: 59 | Admitting: Internal Medicine

## 2023-03-06 ENCOUNTER — Encounter (HOSPITAL_BASED_OUTPATIENT_CLINIC_OR_DEPARTMENT_OTHER): Payer: Self-pay | Admitting: Orthopedic Surgery

## 2023-03-09 ENCOUNTER — Telehealth: Payer: Self-pay | Admitting: Internal Medicine

## 2023-03-09 MED ORDER — AMBULATORY NON FORMULARY MEDICATION: 1 refills | Status: DC

## 2023-03-09 NOTE — Telephone Encounter (Signed)
Called and spoke with patient's sister (April). Pt reported to her that he has been having dysphagia for about 2-3 months but it is getting worse. Pt feels like foods are getting stuck in his throat no matter what. He is being mindful of his food choices and avoiding breads and tougher meats. Pt has continued his PPI BID as prescribed. He has some intermittent nausea, no vomiting. Wondering if he needs another dilation. Pt is scheduled for repeat EGD in October but seeking relief sooner. Pt did recently lose his mother, family is preparing for funeral tomorrow. Please advise, thanks.

## 2023-03-09 NOTE — Telephone Encounter (Signed)
Budesonide slurry 2 mg/10 ml- Give 2 mg (10 ml) twice daily x 4 weeks; pt to swallow; npo 30 minutes after to avoid steroid washing from esophagus. Only done at [facility] or [facility]; insurance does not cover; last check costs around 44 dollars.  Called and spoke with patient and his sister regarding recommendations. They are aware that Budesonide slurry prescription will be called into [facility] pharmacy, pharmacy should let them know when prescription is ready for pick up. Continue Protonix 40 mg BID. Pt will try this for a week and will call if no improvement in symptoms. Pt and his sister verbalized understanding and had no concerns at the end of the call.   Called [facility] pharmacy and spoke with [place]. Gave Betsy a verbal order for Budesonide slurry as outlined above. Tamela Oddi did confirm that they will contact patient once RX is ready.

## 2023-03-09 NOTE — Telephone Encounter (Signed)
Inbound call from patient sister stating patient has been having difficulty swallowing.   Please advise.

## 2023-03-09 NOTE — Telephone Encounter (Signed)
Hx of eosinophilic esophagitis and gastritis Would rec trial of budesonide slurry 2 mg BID (will need to be compounded) Continue BID PPI Try this for 1 week and let me know if not improving from swallowing perspective JMP

## 2023-03-12 ENCOUNTER — Other Ambulatory Visit: Payer: Self-pay | Admitting: Neurology

## 2023-03-12 ENCOUNTER — Ambulatory Visit (INDEPENDENT_AMBULATORY_CARE_PROVIDER_SITE_OTHER): Payer: 59 | Admitting: Orthopedic Surgery

## 2023-03-12 ENCOUNTER — Encounter (HOSPITAL_BASED_OUTPATIENT_CLINIC_OR_DEPARTMENT_OTHER)
Admission: RE | Admit: 2023-03-12 | Discharge: 2023-03-12 | Disposition: A | Discharge status: Home / Self Care | Payer: 59 | Source: Ambulatory Visit | Attending: Orthopedic Surgery | Admitting: Orthopedic Surgery

## 2023-03-12 DIAGNOSIS — B20 Human immunodeficiency virus [HIV] disease: Secondary | ICD-10-CM | POA: Diagnosis not present

## 2023-03-12 DIAGNOSIS — K219 Gastro-esophageal reflux disease without esophagitis: Secondary | ICD-10-CM | POA: Diagnosis not present

## 2023-03-12 DIAGNOSIS — G63 Polyneuropathy in diseases classified elsewhere: Secondary | ICD-10-CM | POA: Diagnosis not present

## 2023-03-12 DIAGNOSIS — Z01812 Encounter for preprocedural laboratory examination: Secondary | ICD-10-CM | POA: Insufficient documentation

## 2023-03-12 DIAGNOSIS — I509 Heart failure, unspecified: Secondary | ICD-10-CM | POA: Diagnosis not present

## 2023-03-12 DIAGNOSIS — M4802 Spinal stenosis, cervical region: Secondary | ICD-10-CM | POA: Diagnosis not present

## 2023-03-12 DIAGNOSIS — R519 Headache, unspecified: Secondary | ICD-10-CM | POA: Diagnosis not present

## 2023-03-12 DIAGNOSIS — J4489 Other specified chronic obstructive pulmonary disease: Secondary | ICD-10-CM | POA: Diagnosis not present

## 2023-03-12 DIAGNOSIS — N182 Chronic kidney disease, stage 2 (mild): Secondary | ICD-10-CM | POA: Diagnosis not present

## 2023-03-12 DIAGNOSIS — M75122 Complete rotator cuff tear or rupture of left shoulder, not specified as traumatic: Secondary | ICD-10-CM | POA: Diagnosis not present

## 2023-03-12 DIAGNOSIS — Z87891 Personal history of nicotine dependence: Secondary | ICD-10-CM | POA: Diagnosis not present

## 2023-03-12 DIAGNOSIS — G5621 Lesion of ulnar nerve, right upper limb: Secondary | ICD-10-CM | POA: Diagnosis not present

## 2023-03-12 DIAGNOSIS — G5601 Carpal tunnel syndrome, right upper limb: Secondary | ICD-10-CM | POA: Diagnosis not present

## 2023-03-12 DIAGNOSIS — Z8546 Personal history of malignant neoplasm of prostate: Secondary | ICD-10-CM | POA: Diagnosis not present

## 2023-03-12 DIAGNOSIS — G43109 Migraine with aura, not intractable, without status migrainosus: Secondary | ICD-10-CM

## 2023-03-12 DIAGNOSIS — Z79899 Other long term (current) drug therapy: Secondary | ICD-10-CM | POA: Diagnosis not present

## 2023-03-12 DIAGNOSIS — I130 Hypertensive heart and chronic kidney disease with heart failure and stage 1 through stage 4 chronic kidney disease, or unspecified chronic kidney disease: Secondary | ICD-10-CM | POA: Diagnosis not present

## 2023-03-12 DIAGNOSIS — K449 Diaphragmatic hernia without obstruction or gangrene: Secondary | ICD-10-CM | POA: Diagnosis not present

## 2023-03-12 DIAGNOSIS — M6281 Muscle weakness (generalized): Secondary | ICD-10-CM | POA: Diagnosis not present

## 2023-03-12 LAB — BASIC METABOLIC PANEL
Anion gap: 5 (ref 5–15)
BUN: 7 mg/dL (ref 6–20)
CO2: 25 mmol/L (ref 22–32)
Calcium: 8.5 mg/dL — ABNORMAL LOW (ref 8.9–10.3)
Chloride: 105 mmol/L (ref 98–111)
Creatinine, Ser: 1.17 mg/dL (ref 0.61–1.24)
GFR, Estimated: >60 mL/min (ref >60)
Glucose, Bld: 96 mg/dL (ref 70–99)
Potassium: 3.6 mmol/L (ref 3.5–5.1)
Sodium: 135 mmol/L (ref 135–145)

## 2023-03-12 NOTE — Progress Notes (Signed)
Casey Brennan - 62 y.o. male MRN [id number]  Date of birth: [birth year]  Office Visit Note: Visit Date: 03/12/2023 PCP: Corwin Levins, MD Referred by: Corwin Levins, MD  Subjective: No chief complaint on file.  HPI: Casey Brennan is a pleasant 62 y.o. male who presents today for follow-up of ongoing right hand numbness and tingling in the ring and small finger.  He is also demonstrating progressive clawing of the ring and small finger with associated atrophy of the intrinsic musculature in the adductor muscles.  He has a complex history of prior cervical decompression surgery both anterior and posterior, most recent surgery was done earlier this year with significant improvement in bilateral upper extremity symptoms.  His right-sided symptoms have remained however, particularly related to the ulnar nerve of the right upper extremity.  He underwent recent EMG for the right upper extremity with Dr. Alvester Morin presents today for follow-up of the results and further treatment options.  He does remain active at baseline, has full use of the right upper extremity currently with daily activities.  Visit Reason: Right hand / 4th&5th digits Hand dominance: right Occupation: Truck driver Diabetic: No  Prior Testing: None Injections: None Treatments: None Prior Surgery: None   Pertinent ROS were reviewed with the patient and found to be negative unless otherwise specified above in HPI.   Assessment & Plan: Visit Diagnoses:  No diagnosis found.   Plan: Extensive discussion was had with patient today regarding his right upper extremity.  He demonstrates significant clinical signs and symptoms consistent with severe ulnar nerve entrapment and associated atrophy.  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  I spoke with the treating physician who  performed electrodiagnostic study today in detail, there is agreement that there is an element of ongoing neuropathy, potentially from the underlying HIV.  However, he would benefit from nerve decompression at both the elbow for the cubital tunnel as well as the wrist for the carpal tunnel and Guyon's canal given the significant findings for electrodiagnostic standpoint.  I explained in detail, that there could be an element of ongoing symptoms despite decompression given the underlying polyneuropathy.    Understanding this, we will plan to move forward with surgical scheduling of right cubital tunnel release, right carpal tunnel release and Guyon's canal release.  Surgery will be performed under general anesthesia.  HIV precautions will be taken.  He has upcoming left shoulder rotator cuff surgery with Dr. Eulah Pont planned for tomorrow.  My recommendation would be that he progresses through this postop recovery process prior to having the right upper extremity operated on for the cubital tunnel, carpal tunnel and Guyon's canal release.  I have advised that he return to me in approximate 6 weeks time for recheck and an update on how his left shoulder is doing.  At that time, if he demonstrates enough recovery of the left shoulder, we can begin to discuss surgical scheduling of the right side.  He expressed understanding.    Follow-up: Return in about 6 weeks (around 04/23/2023) for repeat discussion for R cubital tunnel, carpal tunnel and Guyon's canal after L shoulder surgery.   Meds & Orders: No orders of the defined types were placed in this encounter.   No orders of the defined types were placed in this encounter.    Procedures: No procedures performed  Clinical History: MRI CERVICAL SPINE WITHOUT AND WITH CONTRAST   TECHNIQUE: Multiplanar and multiecho pulse sequences of the cervical spine, to include the craniocervical junction and cervicothoracic junction, were obtained without and  with intravenous contrast.   CONTRAST:  8mL GADAVIST GADOBUTROL 1 MMOL/ML IV SOLN   COMPARISON:  Cervical spine CT 12/06/2022   Cervical spine MRI 10/10/2022   FINDINGS: Alignment: Physiologic.   Vertebrae: C3-C6 ACDF. C4-T1 posterior instrumented fusion with transpedicular screws at the C4, C5, C7 and T1 levels. The posterior fusion is new since 10/10/2022. there is no abnormal bone marrow signal.   Cord: Normal signal and morphology.   Posterior Fossa, vertebral arteries, paraspinal tissues: Dorsal postsurgical changes at C4-T1 with a small amount of fluid in the operative bed but no specific findings of infection.   Disc levels:   C1-2: Unremarkable.   C2-3: Small disc bulge with uncovertebral hypertrophy and mild bilateral facet hypertrophy. There is no spinal canal stenosis. Unchanged mild left neural foraminal stenosis.   C3-4: ACDF. There is no spinal canal stenosis. No neural foraminal stenosis.   C4-5: ACDF and posterior fusion. There is no spinal canal stenosis. No neural foraminal stenosis.   C5-6: ACDF and posterior fusion. Bilateral uncovertebral hypertrophy. There is no spinal canal stenosis. Mild right and severe left neural foraminal stenosis.   C6-7: Intermediate sized disc osteophyte complex. There is no spinal canal stenosis. Moderate right and severe left neural foraminal stenosis.   C7-T1: Posterior fusion. There is no spinal canal stenosis. No neural foraminal stenosis.   IMPRESSION: 1. Status post C3-C6 ACDF and C4-T1 posterior instrumented fusion. No spinal canal stenosis. 2. Severe left C5-6 and C6-7 neural foraminal stenosis. 3. Moderate right C6-7 neural foraminal stenosis.     Electronically Signed   By: Deatra Robinson M.D.   On: 12/06/2022 23:25  He reports that he quit smoking about 12 years ago. His smoking use included cigars and cigarettes. He started smoking about 35 years ago. He has a 34.5 pack-year smoking history. He has  never used smokeless tobacco.  Recent Labs    08/15/22 0735  HGBA1C <4.2    Objective:   Vital Signs: There were no vitals taken for this visit.  Physical Exam  Gen: Well-appearing, in no acute distress; non-toxic CV: Regular Rate. Well-perfused. Warm.  Resp: Breathing unlabored on room air; no wheezing. Psych: Fluid speech in conversation; appropriate affect; normal thought process  Ortho Exam PHYSICAL EXAM:  General: Patient is well appearing and in no distress.  Range of Motion and Palpation Tests: Mobility is full about the elbows with flexion and extension.  No evidence of nerve subluxation bilaterally at the elbows.   Forearm supination and pronation are 85/85 bilaterally.  Wrist flexion/extension is 75/65 bilaterally.  Right hand does demonstrate notable clawing of the ring and small finger, passively correctable.  No cords or nodules are palpated.  No triggering is observed.     Neurologic, Vascular, Motor: Sensation is diminished to light touch in the right ulnar nerve distribution.  Tinel's testing positive at cubital tunnel, Guyon's canal right side.  Two point discrimination is unable to be discerned in the ulnar nerve distribution right hand.    Positive Froment's right side, significant adductor atrophy with FDI wasting  Fingers pink and well perfused.  Capillary refill is brisk.      Lab Results  Component Value Date   HGBA1C <4.2 08/15/2022     Imaging: No results found.  Past Medical/Family/Surgical/Social History: Medications & Allergies  reviewed per EMR, new medications updated. Patient Active Problem List   Diagnosis Date Noted   Pulmonary nodule 02/01/2023   Fever 01/28/2023   Adverse reaction to food 01/26/2023   Left shoulder pain 12/16/2022   Cervical disc disorder with radiculopathy of cervical region 11/30/2022   Malignant neoplasm metastatic to bone (HCC) 11/16/2022   Ulcer of toe, right, limited to breakdown of skin (HCC) 11/16/2022    Cardiomyopathy due to hypertension, without heart failure (HCC) 11/16/2022   Oropharyngeal dysphagia 10/26/2022   Bronchiectasis (HCC) 10/19/2022   Right renal mass 10/19/2022   Abnormal positron emission tomography (PET) scan 10/05/2022   Acute dysfunction of left eustachian tube 09/11/2022   Other chest pain 08/14/2022   External otitis of left ear 08/14/2022   COPD exacerbation (HCC) 06/14/2022   Precordial pain 02/08/2022   Finger pulp abscess, right 11/24/2021   Currently asymptomatic HIV infection, with history of HIV-related illness (HCC) 10/27/2021   Migraine with aura and without status migrainosus, not intractable 10/27/2021   Abnormal urine odor 08/24/2021   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   Routine screening for STI (sexually transmitted infection) 06/29/2021   Viral meningitis, unspecified 06/23/2021   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 06/22/2021   Apnea present when patient supine 06/22/2021   Action tremor 06/22/2021   AKI (acute kidney injury) (HCC) 12/29/2020   Allergy to dairy product 12/27/2020   Angioneurotic edema 12/27/2020   Mild persistent asthma, uncomplicated 12/27/2020   Allergic rhinitis 12/27/2020   Vitamin D deficiency 12/27/2020   S/P cervical spinal fusion 11/11/2020   Nevus 11/11/2020   Myelopathy (HCC) 09/29/2020   Chronic obstructive pulmonary disease with acute exacerbation (HCC) 07/13/2020   Encounter for well adult exam with abnormal findings 06/23/2020   Elevated TSH 05/20/2020   Mixed hyperlipidemia 05/12/2020   Generalized abdominal pain 07/07/2019   Peripheral neuropathy 06/02/2019   Healthcare maintenance 01/08/2019   Myopia of both eyes with astigmatism and presbyopia 09/13/2018   Tension headache 11/12/2017   Hepatitis B immune 06/06/2017   Chronic pain of left wrist 06/06/2017   Constipation 09/27/2016   Chronic renal insufficiency, stage 2 (mild) 05/22/2016   Skin lesion of right lower  extremity 02/22/2016   Migraines 02/22/2016   Low back pain 02/22/2016   Osteoarthritis of right knee 01/11/2016   Kidney stones 10/25/2015   Sinusitis, chronic 05/14/2015   Cough 01/18/2015   GERD (gastroesophageal reflux disease) 07/29/2014   COPD GOLD III 07/22/2014   Hx of primary hypertension 04/27/2014   H/O prostate cancer    Lytic bone lesion of hip 08/22/2012   Cataract, nuclear 05/15/2012   Nuclear sclerotic cataract of both eyes 05/15/2012   Insomnia 05/11/2011   Essential hypertension 04/17/2011   HIV infection (HCC) 04/19/2009   Past Medical History:  Diagnosis Date   Asthma    very rare   Chronic anemia    normocytic   Chronic folliculitis    Chronic kidney disease    Stage 2   COPD (chronic obstructive pulmonary disease) (HCC)    Dyspnea    Environmental and seasonal allergies    Glaucoma    right   H/O pericarditis    2010--  myopercarditis--  resolved   Headache(784.0)    HX SEVERE FRONTAL HA'S   Hiatal hernia    History of gastric ulcer    nonbleeding gastric ulcer 58//24   History of kidney stones    History of MRSA infection 2010   infected boil  HIV (human immunodeficiency virus infection) (HCC) 1988   Hypertension    Hypertensive cardiomyopathy (HCC)    EF 37%, normal coronaries 04/2020 CCTA; LVEF 60-65% 03/2022 echo   Lytic bone lesion of hip    WORK-UP DONE BY ONCOLOGIST DR HA --  NOT MALIGNANT   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   Pneumonia    hx of   Post concussion syndrome    resolved   Prostate cancer (HCC) 04/25/2013   gleason 3+3=6, volume 30 gm   Viral meningitis    06/2021   Family History  Problem Relation Age of Onset   Stroke Father    Diabetes Father    Cancer Father        brain cancer   Asthma Father    Hypertension Sister    Cancer Maternal Uncle        prostate cancer   Asthma Sister    Colon cancer Neg Hx    Colon polyps Neg Hx    Esophageal cancer Neg Hx    Stomach cancer Neg Hx    Rectal cancer  Neg Hx    Past Surgical History:  Procedure Laterality Date   ACDCF  09/29/2020   ANTERIOR CERVICAL DECOMPRESSION/DISCECTOMY FUSION 4 LEVELS N/A 09/29/2020   Procedure: ANTERIOR CERVICAL DECOMPRESSION FUSION CERVICAL 3-CERVICAL 4 , CERVICAL 4 - CERVICAL 5, CERVICAL 5 - CERVICAL 6 WITH INSTRUMENTATION AND ALLOGRAFT;  Surgeon: Estill Bamberg, MD;  Location: [facility] OR;  Service: Orthopedics;  Laterality: N/A;   CARDIAC CATHETERIZATION  03-23-2009  DR COOPER   NORMAL CORONARY ARTERIES   CHOLECYSTECTOMY N/A 11/06/2014   Procedure: LAPAROSCOPIC CHOLECYSTECTOMY WITH INTRAOPERATIVE CHOLANGIOGRAM;  Surgeon: Jimmye Norman, MD;  Location: [facility] OR;  Service: General;  Laterality: N/A;   COLONOSCOPY  12/26/2011   Procedure: COLONOSCOPY;  Surgeon: Shirley Friar, MD;  Location: [facility] END[facility]COPY;  Service: Endoscopy;  Laterality: N/A;   COLONOSCOPY     COLONOSCOPY WITH PROPOFOL N/A 07/29/2014   Procedure: COLONOSCOPY WITH PROPOFOL;  Surgeon: Shirley Friar, MD;  Location: [facility] ENDOSCOPY;  Service: Endoscopy;  Laterality: N/A;   CYSTOSCOPY N/A 05/18/2018   Procedure: CYSTOSCOPY REMOVAL FOREIGN BODY;  Surgeon: Ihor Gully, MD;  Location: [facility] ORS;  Service: Urology;  Laterality: N/A;   CYSTOSCOPY/RETROGRADE/URETEROSCOPY Bilateral 04/25/2013   Procedure: CYSTOSCOPY/ BILATERAL RETROGRADES; BLADDER BIOPSIES;  Surgeon: Sebastian Ache, MD;  Location: [facility];  Service: Urology;  Laterality: Bilateral;   DENTAL EXAMINATION UNDER ANESTHESIA     ESOPHAGOGASTRODUODENOSCOPY  12/26/2011   Procedure: ESOPHAGOGASTRODUODENOSCOPY (EGD);  Surgeon: Shirley Friar, MD;  Location: [facility];  Service: Endoscopy;  Laterality: N/A;   ESOPHAGOGASTRODUODENOSCOPY (EGD) WITH PROPOFOL N/A 07/29/2014   Procedure: ESOPHAGOGASTRODUODENOSCOPY (EGD) WITH PROPOFOL;  Surgeon: Shirley Friar, MD;  Location[facility] ENDOSCOPY;  Service: Endoscopy;  Laterality: N/A;   EXCISION CHRONIC LEFT BREAST ABSCESS  09-21-2010    EXCISIONAL BX LEFT BREAST MASS/  I  &  D LEFT BREAST ABSCESS  03-24-2009   KNEE ARTHROSCOPY Right 1985   left axilla biopsy  04/2013   left hip biopsy  10/2012   LYMPHADENECTOMY Bilateral 08/18/2013   Procedure: LYMPHADENECTOMY "PELVIC LYMPH NODE DISSECTION";  Surgeon: Sebastian Ache, MD;  Location: [facility];  Service: Urology;  Laterality: Bilateral;   POSTERIOR CERVICAL FUSION/FORAMINOTOMY N/A 11/30/2022   Procedure: POSTERIOR DECOMPRESSION AND FUSION CERVICAL 4- CERVICAL 5, CERVICAL 5- CERVICAL 6, CERVICAL 6- CERVICAL 7, CERVICAL 7- THORACIC 1 WITH INSTRUMENTATION AND ALLOGRAFT;  Surgeon: Estill Bamberg, MD;  Location: [facility] OR;  Service: Orthopedics;  Laterality: N/A;   PROSTATE BIOPSY N/A 04/25/2013   Procedure: BIOPSY TRANSRECTAL ULTRASONIC PROSTATE (TUBP);  Surgeon: Sebastian Ache, MD;  Location: [facility];  Service: Urology;  Laterality: N/A;   ROBOT ASSISTED LAPAROSCOPIC RADICAL PROSTATECTOMY N/A 08/18/2013   Procedure: ROBOTIC ASSISTED LAPAROSCOPIC RADICAL PROSTATECTOMY;  Surgeon: Sebastian Ache, MD;  Location: [facility];  Service: Urology;  Laterality: N/A;   TOTAL KNEE ARTHROPLASTY Right 08/01/2016   08/17/16   TOTAL KNEE ARTHROPLASTY Right 08/01/2016   Procedure: TOTAL KNEE ARTHROPLASTY;  Surgeon: Sheral Apley, MD;  Location: [facility] OR;  Service: Orthopedics;  Laterality: Right;   UPPER GASTROINTESTINAL ENDOSCOPY     UPPER LEG SOFT TISSUE BIOPSY Left 2012   lthigh   Social History   Occupational History   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 declined condoms    Anelis Hrivnak Fara Boros) Denese Killings, M.D. [facility] 10:41 AM

## 2023-03-12 NOTE — Progress Notes (Signed)
Enhanced Recovery after Surgery  Enhanced Recovery after Surgery is a protocol used to improve the stress on your body and your recovery after surgery.  Patient Instructions  The night before surgery:  No food after midnight. ONLY clear liquids after midnight  The day of surgery (if you do NOT have diabetes):  Drink ONE (1) Pre-Surgery Clear Ensure as directed.   This drink was given to you during your hospital  pre-op appointment visit. The pre-op nurse will instruct you on the time to drink the  Pre-Surgery Ensure depending on your surgery time. Finish the drink at the designated time by the pre-op nurse.  Nothing else to drink after completing the  Pre-Surgery Clear Ensure.  The day of surgery (if you have diabetes): Drink ONE (1) Gatorade 2 (G2) as directed. This drink was given to you during your hospital  pre-op appointment visit.  The pre-op nurse will instruct you on the time to drink the   Gatorade 2 (G2) depending on your surgery time. Color of the Gatorade may vary. Red is not allowed. Nothing else to drink after completing the  Gatorade 2 (G2).         If office.you have questions, please contact your surgeons office Surgical soap given with instructions, pt verbalized understanding.  Benzoyl peroxide gel given with written instructions, pt verbalized understanding.

## 2023-03-12 NOTE — Anesthesia Preprocedure Evaluation (Addendum)
Anesthesia Evaluation  Patient identified by MRN, date of birth, ID band Patient awake    Reviewed: Allergy & Precautions, NPO status , Patient's Chart, lab work & pertinent test results  History of Anesthesia Complications Negative for: history of anesthetic complications  Airway Mallampati: III  TM Distance: >3 FB Neck ROM: Limited   Comment: Previous grade II view with MAC 4, mask with OPA Dental  (+) Dental Advisory Given   Pulmonary neg shortness of breath, asthma , neg sleep apnea, COPD (last used a couple of days ago),  COPD inhaler, neg recent URI, former smoker Bronchiectasis, pulmonary nodule     + wheezing (mild end-expiratory wheezing heard on a couple of breaths)      Cardiovascular hypertension (metoprolol, torsemide), Pt. on home beta blockers (-) angina +CHF  (-) Past MI, (-) Cardiac Stents and (-) CABG  Rhythm:Regular Rate:Normal  H/o pericarditis in 2010, HLD  Echocardiogram 03/03/2022:  Normal LV systolic function with visual EF 60-65%. Left ventricle cavity  is normal in size. Mild concentric hypertrophy of the left ventricle.  Normal global wall motion. Normal diastolic filling pattern, normal LAP.  Calculated EF 61%.  Structurally normal tricuspid valve with no regurgitation. No evidence of  pulmonary hypertension.  compared to echo from 2021, EF has now normalized.     Neuro/Psych  Headaches, neg Seizures H/o viral meningitis in 2022  Neuromuscular disease (neuropathy, cervical radiculopathy s/p fusion)    GI/Hepatic Neg liver ROS, hiatal hernia, PUD,GERD (eosinophilic esophagitis)  ,,  Endo/Other  negative endocrine ROS    Renal/GU CRFRenal disease (right renal mass)   H/o prostate cancer    Musculoskeletal  (+) Arthritis ,    Abdominal  (+) + obese  Peds  Hematology  (+) Blood dyscrasia, anemia   Anesthesia Other Findings HIV on Biktarvy  Reproductive/Obstetrics                               Anesthesia Physical Anesthesia Plan  ASA: 3  Anesthesia Plan: General   Post-op Pain Management: Regional block*   Induction: Intravenous  PONV Risk Score and Plan: 2 and Ondansetron, Dexamethasone and Treatment may vary due to age or medical condition  Airway Management Planned: Oral ETT and Video Laryngoscope Planned  Additional Equipment:   Intra-op Plan:   Post-operative Plan: Extubation in OR  Informed Consent: I have reviewed the patients History and Physical, chart, labs and discussed the procedure including the risks, benefits and alternatives for the proposed anesthesia with the patient or authorized representative who has indicated his/her understanding and acceptance.     Dental advisory given  Plan Discussed with: CRNA and Anesthesiologist  Anesthesia Plan Comments: (Discussed potential risks of nerve blocks including, but not limited to, infection, bleeding, nerve damage, seizures, pneumothorax, respiratory depression, and potential failure of the block. Alternatives to nerve blocks discussed. All questions answered.  Risks of general anesthesia discussed including, but not limited to, sore throat, hoarse voice, chipped/damaged teeth, injury to vocal cords, nausea and vomiting, allergic reactions, lung infection, heart attack, stroke, and death. All questions answered. )         Anesthesia Quick Evaluation

## 2023-03-13 ENCOUNTER — Encounter (HOSPITAL_BASED_OUTPATIENT_CLINIC_OR_DEPARTMENT_OTHER): Payer: Self-pay | Admitting: Orthopedic Surgery

## 2023-03-13 ENCOUNTER — Ambulatory Visit (HOSPITAL_BASED_OUTPATIENT_CLINIC_OR_DEPARTMENT_OTHER)
Admission: RE | Admit: 2023-03-13 | Discharge: 2023-03-13 | Disposition: A | Discharge status: Home / Self Care | Payer: 59 | Source: Ambulatory Visit | Attending: Orthopedic Surgery | Admitting: Orthopedic Surgery

## 2023-03-13 ENCOUNTER — Ambulatory Visit (HOSPITAL_BASED_OUTPATIENT_CLINIC_OR_DEPARTMENT_OTHER): Payer: 59 | Admitting: Certified Registered Nurse Anesthetist

## 2023-03-13 ENCOUNTER — Encounter (HOSPITAL_BASED_OUTPATIENT_CLINIC_OR_DEPARTMENT_OTHER)
Admission: RE | Disposition: A | Discharge status: Home / Self Care | Payer: Self-pay | Source: Ambulatory Visit | Attending: Orthopedic Surgery

## 2023-03-13 ENCOUNTER — Other Ambulatory Visit: Payer: Self-pay

## 2023-03-13 DIAGNOSIS — N182 Chronic kidney disease, stage 2 (mild): Secondary | ICD-10-CM

## 2023-03-13 DIAGNOSIS — M24012 Loose body in left shoulder: Secondary | ICD-10-CM | POA: Diagnosis not present

## 2023-03-13 DIAGNOSIS — K219 Gastro-esophageal reflux disease without esophagitis: Secondary | ICD-10-CM | POA: Diagnosis not present

## 2023-03-13 DIAGNOSIS — Z87891 Personal history of nicotine dependence: Secondary | ICD-10-CM | POA: Diagnosis not present

## 2023-03-13 DIAGNOSIS — Z8546 Personal history of malignant neoplasm of prostate: Secondary | ICD-10-CM | POA: Insufficient documentation

## 2023-03-13 DIAGNOSIS — M75102 Unspecified rotator cuff tear or rupture of left shoulder, not specified as traumatic: Secondary | ICD-10-CM | POA: Diagnosis not present

## 2023-03-13 DIAGNOSIS — I10 Essential (primary) hypertension: Secondary | ICD-10-CM

## 2023-03-13 DIAGNOSIS — J4489 Other specified chronic obstructive pulmonary disease: Secondary | ICD-10-CM | POA: Diagnosis not present

## 2023-03-13 DIAGNOSIS — G63 Polyneuropathy in diseases classified elsewhere: Secondary | ICD-10-CM | POA: Insufficient documentation

## 2023-03-13 DIAGNOSIS — M75122 Complete rotator cuff tear or rupture of left shoulder, not specified as traumatic: Secondary | ICD-10-CM | POA: Insufficient documentation

## 2023-03-13 DIAGNOSIS — I509 Heart failure, unspecified: Secondary | ICD-10-CM | POA: Insufficient documentation

## 2023-03-13 DIAGNOSIS — I130 Hypertensive heart and chronic kidney disease with heart failure and stage 1 through stage 4 chronic kidney disease, or unspecified chronic kidney disease: Secondary | ICD-10-CM

## 2023-03-13 DIAGNOSIS — Z79899 Other long term (current) drug therapy: Secondary | ICD-10-CM | POA: Diagnosis not present

## 2023-03-13 DIAGNOSIS — M19012 Primary osteoarthritis, left shoulder: Secondary | ICD-10-CM | POA: Diagnosis not present

## 2023-03-13 DIAGNOSIS — R519 Headache, unspecified: Secondary | ICD-10-CM | POA: Insufficient documentation

## 2023-03-13 DIAGNOSIS — M25512 Pain in left shoulder: Secondary | ICD-10-CM

## 2023-03-13 DIAGNOSIS — B20 Human immunodeficiency virus [HIV] disease: Secondary | ICD-10-CM | POA: Insufficient documentation

## 2023-03-13 DIAGNOSIS — G8918 Other acute postprocedural pain: Secondary | ICD-10-CM | POA: Diagnosis not present

## 2023-03-13 DIAGNOSIS — M7522 Bicipital tendinitis, left shoulder: Secondary | ICD-10-CM | POA: Diagnosis not present

## 2023-03-13 DIAGNOSIS — K449 Diaphragmatic hernia without obstruction or gangrene: Secondary | ICD-10-CM | POA: Insufficient documentation

## 2023-03-13 DIAGNOSIS — S46012A Strain of muscle(s) and tendon(s) of the rotator cuff of left shoulder, initial encounter: Secondary | ICD-10-CM | POA: Diagnosis not present

## 2023-03-13 DIAGNOSIS — M7552 Bursitis of left shoulder: Secondary | ICD-10-CM | POA: Diagnosis not present

## 2023-03-13 DIAGNOSIS — M24112 Other articular cartilage disorders, left shoulder: Secondary | ICD-10-CM | POA: Diagnosis not present

## 2023-03-13 HISTORY — PX: SHOULDER ARTHROSCOPY WITH ROTATOR CUFF REPAIR AND SUBACROMIAL DECOMPRESSION: SHX5686

## 2023-03-13 SURGERY — SHOULDER ARTHROSCOPY WITH ROTATOR CUFF REPAIR AND SUBACROMIAL DECOMPRESSION
Anesthesia: General | Site: Shoulder | Laterality: Left

## 2023-03-13 MED ORDER — POVIDONE-IODINE 10 % EX SWAB
2.0000 | Freq: Once | CUTANEOUS | Status: AC
  Administered 2023-03-13: 2 via TOPICAL

## 2023-03-13 MED ORDER — AMISULPRIDE (ANTIEMETIC) 5 MG/2ML IV SOLN: 10.0000 mg | Freq: Once | INTRAVENOUS | Status: DC | PRN

## 2023-03-13 MED ORDER — FENTANYL CITRATE (PF) 100 MCG/2ML IJ SOLN
25.0000 ug | INTRAMUSCULAR | Status: DC | PRN
  Administered 2023-03-13: 25 ug via INTRAVENOUS

## 2023-03-13 MED ORDER — CEFAZOLIN SODIUM-DEXTROSE 2-4 GM/100ML-% IV SOLN
INTRAVENOUS | Status: AC
  Filled 2023-03-13: qty 100

## 2023-03-13 MED ORDER — LACTATED RINGERS IV SOLN: INTRAVENOUS | Status: DC

## 2023-03-13 MED ORDER — DEXAMETHASONE SODIUM PHOSPHATE 10 MG/ML IJ SOLN
INTRAMUSCULAR | Status: DC | PRN
  Administered 2023-03-13: 8 mg via INTRAVENOUS

## 2023-03-13 MED ORDER — SUGAMMADEX SODIUM 200 MG/2ML IV SOLN
INTRAVENOUS | Status: DC | PRN
  Administered 2023-03-13: 200 mg via INTRAVENOUS

## 2023-03-13 MED ORDER — FENTANYL CITRATE (PF) 100 MCG/2ML IJ SOLN
INTRAMUSCULAR | Status: AC
  Filled 2023-03-13: qty 2

## 2023-03-13 MED ORDER — ROCURONIUM BROMIDE 10 MG/ML (PF) SYRINGE
PREFILLED_SYRINGE | INTRAVENOUS | Status: DC | PRN
  Administered 2023-03-13: 70 mg via INTRAVENOUS

## 2023-03-13 MED ORDER — ROCURONIUM BROMIDE 10 MG/ML (PF) SYRINGE
PREFILLED_SYRINGE | INTRAVENOUS | Status: AC
  Filled 2023-03-13: qty 10

## 2023-03-13 MED ORDER — ALBUTEROL SULFATE (2.5 MG/3ML) 0.083% IN NEBU
2.5000 mg | INHALATION_SOLUTION | Freq: Once | RESPIRATORY_TRACT | Status: AC
  Administered 2023-03-13: 2.5 mg via RESPIRATORY_TRACT

## 2023-03-13 MED ORDER — PROPOFOL 500 MG/50ML IV EMUL
INTRAVENOUS | Status: AC
  Filled 2023-03-13: qty 50

## 2023-03-13 MED ORDER — VANCOMYCIN HCL 500 MG IV SOLR
INTRAVENOUS | Status: AC
  Filled 2023-03-13: qty 10

## 2023-03-13 MED ORDER — ALBUTEROL SULFATE (2.5 MG/3ML) 0.083% IN NEBU
INHALATION_SOLUTION | RESPIRATORY_TRACT | Status: AC
  Filled 2023-03-13: qty 3

## 2023-03-13 MED ORDER — LIDOCAINE 2% (20 MG/ML) 5 ML SYRINGE
INTRAMUSCULAR | Status: DC | PRN
  Administered 2023-03-13: 60 mg via INTRAVENOUS

## 2023-03-13 MED ORDER — PHENYLEPHRINE HCL (PRESSORS) 10 MG/ML IV SOLN
INTRAVENOUS | Status: AC
  Filled 2023-03-13: qty 1

## 2023-03-13 MED ORDER — SODIUM CHLORIDE 0.9 % IR SOLN
Status: DC | PRN
  Administered 2023-03-13: 18000 mL

## 2023-03-13 MED ORDER — MIDAZOLAM HCL 2 MG/2ML IJ SOLN
2.0000 mg | Freq: Once | INTRAMUSCULAR | Status: AC
  Administered 2023-03-13: 2 mg via INTRAVENOUS

## 2023-03-13 MED ORDER — BUPIVACAINE HCL (PF) 0.5 % IJ SOLN
INTRAMUSCULAR | Status: AC
  Filled 2023-03-13: qty 30

## 2023-03-13 MED ORDER — PROPOFOL 10 MG/ML IV BOLUS
INTRAVENOUS | Status: DC | PRN
  Administered 2023-03-13: 150 mg via INTRAVENOUS

## 2023-03-13 MED ORDER — MIDAZOLAM HCL 2 MG/2ML IJ SOLN
INTRAMUSCULAR | Status: AC
  Filled 2023-03-13: qty 2

## 2023-03-13 MED ORDER — FENTANYL CITRATE (PF) 250 MCG/5ML IJ SOLN
INTRAMUSCULAR | Status: DC | PRN
  Administered 2023-03-13: 50 ug via INTRAVENOUS

## 2023-03-13 MED ORDER — PHENYLEPHRINE HCL-NACL 20-0.9 MG/250ML-% IV SOLN
INTRAVENOUS | Status: DC | PRN
  Administered 2023-03-13: 25 ug/min via INTRAVENOUS

## 2023-03-13 MED ORDER — BUPIVACAINE HCL (PF) 0.25 % IJ SOLN
INTRAMUSCULAR | Status: AC
  Filled 2023-03-13: qty 30

## 2023-03-13 MED ORDER — CEFAZOLIN SODIUM-DEXTROSE 2-4 GM/100ML-% IV SOLN
2.0000 g | INTRAVENOUS | Status: AC
  Administered 2023-03-13: 2 g via INTRAVENOUS

## 2023-03-13 MED ORDER — BUPIVACAINE HCL (PF) 0.5 % IJ SOLN
INTRAMUSCULAR | Status: DC | PRN
  Administered 2023-03-13: 10 mL via PERINEURAL

## 2023-03-13 MED ORDER — LIDOCAINE 2% (20 MG/ML) 5 ML SYRINGE
INTRAMUSCULAR | Status: AC
  Filled 2023-03-13: qty 5

## 2023-03-13 MED ORDER — DEXAMETHASONE SODIUM PHOSPHATE 10 MG/ML IJ SOLN: 8.0000 mg | Freq: Once | INTRAMUSCULAR | Status: DC

## 2023-03-13 MED ORDER — ONDANSETRON HCL 4 MG/2ML IJ SOLN
INTRAMUSCULAR | Status: AC
  Filled 2023-03-13: qty 2

## 2023-03-13 MED ORDER — DEXAMETHASONE SODIUM PHOSPHATE 10 MG/ML IJ SOLN
INTRAMUSCULAR | Status: AC
  Filled 2023-03-13: qty 1

## 2023-03-13 MED ORDER — ONDANSETRON HCL 4 MG/2ML IJ SOLN
INTRAMUSCULAR | Status: DC | PRN
  Administered 2023-03-13: 4 mg via INTRAVENOUS

## 2023-03-13 MED ORDER — BUPIVACAINE LIPOSOME 1.3 % IJ SUSP
INTRAMUSCULAR | Status: DC | PRN
Start: 2023-03-13 — End: 2023-03-13
  Administered 2023-03-13: 10 mL via PERINEURAL

## 2023-03-13 MED ORDER — HYDROCODONE-ACETAMINOPHEN 7.5-325 MG PO TABS: 1.0000 | ORAL_TABLET | Freq: Four times a day (QID) | ORAL | 0 refills | Status: DC | PRN

## 2023-03-13 SURGICAL SUPPLY — 52 items
AID PSTN UNV HD RSTRNT DISP (MISCELLANEOUS) ×1
ANCH SUT 2.9 PUSHLOCK ANCH (Orthopedic Implant) ×1 IMPLANT
ANCH SUT FBRTAPE 1.3X2.6X1.7 (Anchor) ×1 IMPLANT
ANCH SUT TGRTAPE 1.3X2.6X1.7 (Anchor) ×1 IMPLANT
ANCHOR SUT FBRTK 2.6 SOFT 1.7 (Anchor) IMPLANT
ANCHOR SUT FBRTK 2.6 SP1.7 TAP (Anchor) IMPLANT
ANCHOR SWIVELOCK SP KL 4.75 (Anchor) IMPLANT
APL PRP STRL LF DISP 70% ISPRP (MISCELLANEOUS) ×1
BLADE EXCALIBUR 4.0X13 (MISCELLANEOUS) IMPLANT
BURR OVAL 8 FLU 5.0X13 (MISCELLANEOUS) IMPLANT
CANNULA 5.75X71 LONG (CANNULA) IMPLANT
CANNULA TWIST IN 8.25X7CM (CANNULA) IMPLANT
CHLORAPREP W/TINT 26 (MISCELLANEOUS) ×2 IMPLANT
DISSECTOR 3.8MM X 13CM (MISCELLANEOUS) IMPLANT
DRAPE IMP U-DRAPE 54X76 (DRAPES) ×2 IMPLANT
DRAPE INCISE IOBAN 66X45 STRL (DRAPES) ×2 IMPLANT
DRAPE POUCH INSTRU U-SHP 10X18 (DRAPES) ×2 IMPLANT
DRAPE SHOULDER BEACH CHAIR (DRAPES) ×2 IMPLANT
DRAPE U-SHAPE 47X51 STRL (DRAPES) ×2 IMPLANT
DRSG EMULSION OIL 3X3 NADH (GAUZE/BANDAGES/DRESSINGS) IMPLANT
GAUZE PAD ABD 8X10 STRL (GAUZE/BANDAGES/DRESSINGS) ×4 IMPLANT
GAUZE SPONGE 4X4 12PLY STRL (GAUZE/BANDAGES/DRESSINGS) ×2 IMPLANT
GAUZE XEROFORM 1X8 LF (GAUZE/BANDAGES/DRESSINGS) IMPLANT
GLOVE BIO SURGEON STRL SZ7.5 (GLOVE) ×2 IMPLANT
GLOVE BIOGEL PI IND STRL 7.5 (GLOVE) ×2 IMPLANT
GLOVE BIOGEL PI IND STRL 8 (GLOVE) ×2 IMPLANT
GLOVE SURG SYN 7.5 E (GLOVE) ×1 IMPLANT
GLOVE SURG SYN 7.5 PF PI (GLOVE) ×2 IMPLANT
GOWN STRL REUS W/ TWL LRG LVL3 (GOWN DISPOSABLE) ×2 IMPLANT
GOWN STRL REUS W/ TWL XL LVL3 (GOWN DISPOSABLE) ×2 IMPLANT
GOWN STRL REUS W/TWL LRG LVL3 (GOWN DISPOSABLE) ×1
GOWN STRL REUS W/TWL XL LVL3 (GOWN DISPOSABLE) ×3 IMPLANT
MANIFOLD NEPTUNE II (INSTRUMENTS) ×2 IMPLANT
NDL HD SCORPION MEGA LOADER (NEEDLE) IMPLANT
PACK ARTHROSCOPY DSU (CUSTOM PROCEDURE TRAY) ×2 IMPLANT
PACK BASIN DAY SURGERY FS (CUSTOM PROCEDURE TRAY) ×2 IMPLANT
PORT APPOLLO RF 90DEGREE MULTI (SURGICAL WAND) ×2 IMPLANT
RESTRAINT HEAD UNIVERSAL NS (MISCELLANEOUS) ×2 IMPLANT
SLEEVE SCD COMPRESS KNEE MED (STOCKING) ×2 IMPLANT
SLING ARM FOAM STRAP LRG (SOFTGOODS) IMPLANT
SUCTION TUBE FRAZIER 10FR DISP (SUCTIONS) IMPLANT
SUT ETHILON 2 0 FS 18 (SUTURE) IMPLANT
SUT ETHILON 3 0 PS 1 (SUTURE) IMPLANT
SUT FIBERWIRE #2 38 T-5 BLUE (SUTURE)
SUT MNCRL AB 3-0 PS2 18 (SUTURE) ×2 IMPLANT
SUT TIGER TAPE 7 IN WHITE (SUTURE) IMPLANT
SUTURE FIBERWR #2 38 T-5 BLUE (SUTURE) IMPLANT
SYSTEM IMPL TENODESIS LNT 2.9 (Orthopedic Implant) IMPLANT
TAPE FIBER 2MM 7IN #2 BLUE (SUTURE) IMPLANT
TOWEL GREEN STERILE FF (TOWEL DISPOSABLE) ×2 IMPLANT
TUBE CONNECTING 20X1/4 (TUBING) ×4 IMPLANT
TUBING ARTHROSCOPY IRRIG 16FT (MISCELLANEOUS) ×2 IMPLANT

## 2023-03-13 NOTE — Anesthesia Postprocedure Evaluation (Signed)
Anesthesia Post Note  Patient: Casey Brennan  Procedure(s) Performed: SHOULDER ARTHROSCOPY WITH ROTATOR CUFF REPAIR AND SUBACROMIAL DECOMPRESSION, DISTAL CLAVICLECTOMY, BICEPS TENODESIS (Left: Shoulder)     Patient location during evaluation: PACU Anesthesia Type: General Level of consciousness: awake Pain management: pain level controlled Vital Signs Assessment: post-procedure vital signs reviewed and stable Respiratory status: spontaneous breathing, nonlabored ventilation and respiratory function stable Cardiovascular status: blood pressure returned to baseline and stable Postop Assessment: no apparent nausea or vomiting Anesthetic complications: no   No notable events documented.  Last Vitals:  Vitals:   03/13/23 0945 03/13/23 1000  BP: (!) 138/91 121/86  Pulse: 83 81  Resp: 16 15  Temp:    SpO2: 95% 93%    Last Pain:  Vitals:   03/13/23 1000  TempSrc:   PainSc: 0-No pain                 Linton Rump

## 2023-03-13 NOTE — Op Note (Signed)
03/13/2023  9:16 AM  PATIENT:  Casey Brennan    PRE-OPERATIVE DIAGNOSIS:  LEFT SHOULDER ROTATOR CUFF TEAR  POST-OPERATIVE DIAGNOSIS:  Same  PROCEDURE:  SHOULDER ARTHROSCOPY WITH ROTATOR CUFF REPAIR AND SUBACROMIAL DECOMPRESSION, DISTAL CLAVICLECTOMY, BICEPS TENODESIS  SURGEON:  Sheral Apley, MD  ASSISTANT: Levester Fresh, PA-C, he was present and scrubbed throughout the case, critical for completion in a timely fashion, and for retraction, instrumentation, and closure.   ANESTHESIA:   General  PREOPERATIVE INDICATIONS:  Casey Brennan is a  62 y.o. male with a diagnosis of LEFT SHOULDER ROTATOR CUFF TEAR who failed conservative measures and elected for surgical management.    The risks benefits and alternatives were discussed with the patient preoperatively including but not limited to the risks of infection, bleeding, nerve injury, cardiopulmonary complications, the need for revision surgery, among others, and the patient was willing to proceed.  DIAGNOSES: Left shoulder, acute traumatic rotator cuff tear, SLAP tear, biceps tendinitis, AC arthritis, subacromial impingement, and synovitis, loose body in SA space .  POST-OPERATIVE DIAGNOSIS: same  PROCEDURE: Arthroscopic extensive debridement - 29823 Subdeltoid Bursa, Superior Labrum, and synovium,  Arthroscopic distal clavicle excision - 16109 Arthroscopic subacromial decompression - 29826 Arthroscopic rotator cuff repair - 29827 Arthroscopic biceps tenodesis - 60454 Loose body excision 29819   OPERATIVE FINDING: Exam under anesthesia: Normal Articular space: Normal Chondral surfaces: Normal Biceps:  fayed Subscapularis: Intact  Supraspinatus: Complete tear  Infraspinatus: Intact     BLOOD LOSS: minimal  COMPLICATIONS: none  OPERATIVE PROCEDURE:  Patient was identified in the preoperative holding area and site was marked by me He was transported to the operating theater and placed on the table in beach chair  position taking care to pad all bony prominences. After a preincinduction time out anesthesia was induced. The left upper extremity was prepped and draped in normal sterile fashion and a pre-incision timeout was performed. Casey Brennan received ancef for preoperative antibiotics.   Initially made a posterior arthroscopic portal and inserted the arthroscope into the glenohumeral joint. tour of the joint demonstrated the above operative findings  I created an anterior portal just lateral to the coracoid under direct visualization using a spinal needle.  I performed an extensive debridement of the scarred synovial tissue and remaining structures including subdeltoid bursa and labrum   I used a combination of biter and shaver to release the biceps tendon from the superior labrum and then used the shaver to debride the superior labrum to a smooth rim. I performed a loop and tack tenodesis  I then introduced the arthroscope into the subacromial space and brought the shaver into the anterior portal. I debrided the bursa for appropriate visualization.  I then performed a subacromial decompression using combination of the shaver ArthroCare and burr using a cutting block technique. As happy with the final elevation of the subacromial space on multiple portal views.   Next I turned my attention to the distal clavicle and through the anterior portal using the bur and shaver I was able to perform a distal clavicle excision. I then switched portals and inserted the arthroscope into the anterior portal and was happy with an appropriate resection of the distal clavicle.  I debrided the RCT and performed a 2x2 dual row repair of the supraspnatus tendon .  I removed an 8mm loose body with a grabber after expanding the ant portal   Next I removed all arthroscopic equipment expressed all fluid and closed the portals with a nylon stitch.  A sterile dressing was applied the patient was taken the PACU in stable  condition.  POST OPERATIVE PLAN: The patient will be in a sling full-time and keep the dressings clean dry and intact. DVT prophylaxis will consist of early ambulation

## 2023-03-13 NOTE — Telephone Encounter (Signed)
Dr. Vickey Huger,  Your last note stated:  Plan )I had tried to treat the throbbing HA with Bernita Raisin , initially with success- and he took the medication several weeks daily , but  has not much relief now.  Will use Qlipta , which is preventive , too.  Use daily   Did that mean you you wanted him to do both? I just wanted to clarify.   Thanks,  Production assistant, radio

## 2023-03-13 NOTE — Progress Notes (Signed)
Assisted Dr. Ardeen Jourdain with left, interscalene , ultrasound guided block. Side rails up, monitors on throughout procedure. See vital signs in flow sheet. Tolerated Procedure well.  Neb started per order

## 2023-03-13 NOTE — Interval H&P Note (Signed)
History and Physical Interval Note:  03/13/2023 7:20 AM  Casey Brennan  has presented today for surgery, with the diagnosis of LEFT SHOULDER ROTATOR CUFF TEAR.  The various methods of treatment have been discussed with the patient and family. After consideration of risks, benefits and other options for treatment, the patient has consented to  Procedure(s): SHOULDER ARTHROSCOPY WITH ROTATOR CUFF REPAIR AND SUBACROMIAL DECOMPRESSION, DISTAL CLAVICLECTOMY, BICEPS TENODESIS (Left) as a surgical intervention.  The patient's history has been reviewed, patient examined, no change in status, stable for surgery.  I have reviewed the patient's chart and labs.  Questions were answered to the patient's satisfaction.     Sheral Apley

## 2023-03-13 NOTE — Discharge Instructions (Addendum)
POST-OPERATIVE OPIOID TAPER INSTRUCTIONS: It is important to wean off of your opioid medication as soon as possible. If you do not need pain medication after your surgery it is ok to stop day one. Opioids include: Codeine, Hydrocodone(Norco, Vicodin), Oxycodone(Percocet, oxycontin) and hydromorphone amongst others.  Long term and even short term use of opiods can cause: Increased pain response Dependence Constipation Depression Respiratory depression And more.  Withdrawal symptoms can include Flu like symptoms Nausea, vomiting And more Techniques to manage these symptoms Hydrate well Eat regular healthy meals Stay active Use relaxation techniques(deep breathing, meditating, yoga) Do Not substitute Alcohol to help with tapering If you have been on opioids for less than two weeks and do not have pain than it is ok to stop all together.  Plan to wean off of opioids This plan should start within one week post op of your joint replacement. Maintain the same interval or time between taking each dose and first decrease the dose.  Cut the total daily intake of opioids by one tablet each day Next start to increase the time between doses. The last dose that should be eliminated is the evening dose.    Post Anesthesia Home Care Instructions  Activity: Get plenty of rest for the remainder of the day. A responsible individual must stay with you for 24 hours following the procedure.  For the next 24 hours, DO NOT: -Drive a car -Advertising copywriter -Drink alcoholic beverages -Take any medication unless instructed by your physician -Make any legal decisions or sign important papers.  Meals: Start with liquid foods such as gelatin or soup. Progress to regular foods as tolerated. Avoid greasy, spicy, heavy foods. If nausea and/or vomiting occur, drink only clear liquids until the nausea and/or vomiting subsides. Call your physician if vomiting continues.  Special Instructions/Symptoms: Your  throat may feel dry or sore from the anesthesia or the breathing tube placed in your throat during surgery. If this causes discomfort, gargle with warm salt water. The discomfort should disappear within 24 hours.  If you had a scopolamine patch placed behind your ear for the management of post- operative nausea and/or vomiting:  1. The medication in the patch is effective for 72 hours, after which it should be removed.  Wrap patch in a tissue and discard in the trash. Wash hands thoroughly with soap and water. 2. You may remove the patch earlier than 72 hours if you experience unpleasant side effects which may include dry mouth, dizziness or visual disturbances. 3. Avoid touching the patch. Wash your hands with soap and water after contact with the patch.   Regional Anesthesia Blocks  1. You may not be able to move or feel the "blocked" extremity after a regional anesthetic block. This may last may last from 3-48 hours after placement, but it will go away. The length of time depends on the medication injected and your individual response to the medication. As the nerves start to wake up, you may experience tingling as the movement and feeling returns to your extremity. If the numbness and inability to move your extremity has not gone away after 48 hours, please call your surgeon.   2. The extremity that is blocked will need to be protected until the numbness is gone and the strength has returned. Because you cannot feel it, you will need to take extra care to avoid injury. Because it may be weak, you may have difficulty moving it or using it. You may not know what position it is in  without looking at it while the block is in effect.  3. For blocks in the legs and feet, returning to weight bearing and walking needs to be done carefully. You will need to wait until the numbness is entirely gone and the strength has returned. You should be able to move your leg and foot normally before you try and bear  weight or walk. You will need someone to be with you when you first try to ensure you do not fall and possibly risk injury.  4. Bruising and tenderness at the needle site are common side effects and will resolve in a few days.  5. Persistent numbness or new problems with movement should be communicated to the surgeon or the [facility] [id number] [facility] [id number]).Information for Discharge Teaching: EXPAREL (bupivacaine liposome injectable suspension)   Pain relief is important to your recovery. The goal is to control your pain so you can move easier and return to your normal activities as soon as possible after your procedure. Your physician may use several types of medicines to manage pain, swelling, and more.  Your surgeon or anesthesiologist gave you EXPAREL(bupivacaine) to help control your pain after surgery.  EXPAREL is a local anesthetic designed to release slowly over an extended period of time to provide pain relief by numbing the tissue around the surgical site. EXPAREL is designed to release pain medication over time and can control pain for up to 72 hours. Depending on how you respond to EXPAREL, you may require less pain medication during your recovery. EXPAREL can help reduce or eliminate the need for opioids during the first few days after surgery when pain relief is needed the most. EXPAREL is not an opioid and is not addictive. It does not cause sleepiness or sedation.   Important! A teal colored band has been placed on your arm with the date, time and amount of EXPAREL you have received. Please leave this armband in place for the full 96 hours following administration, and then you may remove the band. If you return to the hospital for any reason within 96 hours following the administration of EXPAREL, the armband provides important information that your health care providers to know, and alerts them that you have received this anesthetic.     Possible side effects of EXPAREL: Temporary loss of sensation or ability to move in the area where medication was injected. Nausea, vomiting, constipation Rarely, numbness and tingling in your mouth or lips, lightheadedness, or anxiety may occur. Call your doctor right away if you think you may be experiencing any of these sensations, or if you have other questions regarding possible side effects.  Follow all other discharge instructions given to you by your surgeon or nurse. Eat a healthy diet and drink plenty of water or other fluids.

## 2023-03-13 NOTE — Anesthesia Procedure Notes (Signed)
Procedure Name: Intubation Date/Time: 03/13/2023 7:41 AM  Performed by: Demetrio Lapping, CRNAPre-anesthesia Checklist: Patient identified, Emergency Drugs available, Suction available and Patient being monitored Patient Re-evaluated:Patient Re-evaluated prior to induction Oxygen Delivery Method: Circle System Utilized Preoxygenation: Pre-oxygenation with 100% oxygen Induction Type: IV induction Ventilation: Mask ventilation without difficulty Laryngoscope Size: Glidescope and 4 (elective d/t limited neck ROM) Grade View: Grade I Tube type: Oral Tube size: 7.5 mm Number of attempts: 1 Airway Equipment and Method: Stylet and Oral airway Placement Confirmation: ETT inserted through vocal cords under direct vision, positive ETCO2 and breath sounds checked- equal and bilateral Secured at: 22 cm Tube secured with: Tape Dental Injury: Teeth and Oropharynx as per pre-operative assessment

## 2023-03-13 NOTE — Transfer of Care (Signed)
Immediate Anesthesia Transfer of Care Note  Patient: Casey Brennan  Procedure(s) Performed: SHOULDER ARTHROSCOPY WITH ROTATOR CUFF REPAIR AND SUBACROMIAL DECOMPRESSION, DISTAL CLAVICLECTOMY, BICEPS TENODESIS (Left: Shoulder)  Patient Location: PACU  Anesthesia Type:General  Level of Consciousness: drowsy  Airway & Oxygen Therapy: Patient Spontanous Breathing and Patient connected to face mask oxygen  Post-op Assessment: Report given to RN and Post -op Vital signs reviewed and stable  Post vital signs: Reviewed and stable  Last Vitals:  Vitals Value Taken Time  BP 140/88 03/13/23 0931  Temp 36.6 C 03/13/23 0931  Pulse 86 03/13/23 0934  Resp 22 03/13/23 0934  SpO2 99 % 03/13/23 0934  Vitals shown include unfiled device data.  Last Pain:  Vitals:   03/13/23 0631  TempSrc: Temporal  PainSc: 4       Patients Stated Pain Goal: 5 (03/13/23 0631)  Complications: No notable events documented.

## 2023-03-13 NOTE — Anesthesia Procedure Notes (Signed)
Anesthesia Regional Block: Interscalene brachial plexus block   Pre-Anesthetic Checklist: , timeout performed,  Correct Patient, Correct Site, Correct Laterality,  Correct Procedure, Correct Position, site marked,  Risks and benefits discussed,  Surgical consent,  Pre-op evaluation,  At surgeon's request and post-op pain management  Laterality: Left  Prep: chloraprep       Needles:  Injection technique: Single-shot  Needle Type: Echogenic Stimulator Needle     Needle Length: 9cm  Needle Gauge: 21     Additional Needles:   Procedures:,,,, ultrasound used (permanent image in chart),,    Narrative:  Start time: 03/13/2023 7:00 AM End time: 03/13/2023 7:03 AM Injection made incrementally with aspirations every 5 mL.  Performed by: Personally  Anesthesiologist: Linton Rump, MD  Additional Notes: Discussed risks and benefits of nerve block including, but not limited to, prolonged and/or permanent nerve injury involving sensory and/or motor function. Monitors were applied and a time-out was performed. The nerve and associated structures were visualized under ultrasound guidance. After negative aspiration, local anesthetic was slowly injected around the nerve. There was no evidence of high pressure during the procedure. There were no paresthesias. VSS remained stable and the patient tolerated the procedure well.

## 2023-03-14 ENCOUNTER — Encounter (HOSPITAL_BASED_OUTPATIENT_CLINIC_OR_DEPARTMENT_OTHER): Payer: Self-pay | Admitting: Orthopedic Surgery

## 2023-03-16 DIAGNOSIS — M25612 Stiffness of left shoulder, not elsewhere classified: Secondary | ICD-10-CM | POA: Diagnosis not present

## 2023-03-16 DIAGNOSIS — M6281 Muscle weakness (generalized): Secondary | ICD-10-CM | POA: Diagnosis not present

## 2023-03-16 DIAGNOSIS — M7522 Bicipital tendinitis, left shoulder: Secondary | ICD-10-CM | POA: Diagnosis not present

## 2023-03-16 DIAGNOSIS — S46012D Strain of muscle(s) and tendon(s) of the rotator cuff of left shoulder, subsequent encounter: Secondary | ICD-10-CM | POA: Diagnosis not present

## 2023-03-21 NOTE — Telephone Encounter (Signed)
This encounter was created in error - please disregard.

## 2023-03-22 ENCOUNTER — Other Ambulatory Visit: Payer: Self-pay | Admitting: Internal Medicine

## 2023-03-22 ENCOUNTER — Other Ambulatory Visit: Payer: Self-pay

## 2023-03-22 ENCOUNTER — Telehealth: Payer: Self-pay | Admitting: Neurology

## 2023-03-22 ENCOUNTER — Other Ambulatory Visit: Payer: Self-pay | Admitting: Cardiology

## 2023-03-22 DIAGNOSIS — G44229 Chronic tension-type headache, not intractable: Secondary | ICD-10-CM

## 2023-03-22 DIAGNOSIS — R252 Cramp and spasm: Secondary | ICD-10-CM

## 2023-03-22 DIAGNOSIS — B20 Human immunodeficiency virus [HIV] disease: Secondary | ICD-10-CM

## 2023-03-22 DIAGNOSIS — G250 Essential tremor: Secondary | ICD-10-CM

## 2023-03-22 DIAGNOSIS — G43109 Migraine with aura, not intractable, without status migrainosus: Secondary | ICD-10-CM

## 2023-03-22 MED ORDER — UBRELVY 100 MG PO TABS: 100.0000 mg | ORAL_TABLET | ORAL | 5 refills | Status: DC | PRN

## 2023-03-22 NOTE — Telephone Encounter (Signed)
Refill sent to the pharmacy for the patient. PA is approved for 16 tablets. Confirmed on CMM

## 2023-03-22 NOTE — Telephone Encounter (Signed)
Pt's sister, [name] needs new prescription for the 16 tablets for Ubrelvy 100 mg. Pharmacy said prescription only had enough refill for 10 tablets. Insurance will allow 16 tablets. Would like a call back.

## 2023-03-23 ENCOUNTER — Other Ambulatory Visit (HOSPITAL_COMMUNITY): Payer: Self-pay

## 2023-03-26 DIAGNOSIS — M7522 Bicipital tendinitis, left shoulder: Secondary | ICD-10-CM | POA: Diagnosis not present

## 2023-03-27 ENCOUNTER — Ambulatory Visit (INDEPENDENT_AMBULATORY_CARE_PROVIDER_SITE_OTHER): Payer: 59 | Admitting: Internal Medicine

## 2023-03-27 ENCOUNTER — Encounter: Payer: Self-pay | Admitting: Internal Medicine

## 2023-03-27 ENCOUNTER — Other Ambulatory Visit: Payer: Self-pay | Admitting: Cardiology

## 2023-03-27 ENCOUNTER — Other Ambulatory Visit: Payer: Self-pay | Admitting: Pulmonary Disease

## 2023-03-27 VITALS — BP 130/76 | HR 65 | Temp 98.2°F | Ht 67.0 in | Wt 217.0 lb

## 2023-03-27 DIAGNOSIS — M5010 Cervical disc disorder with radiculopathy, unspecified cervical region: Secondary | ICD-10-CM | POA: Diagnosis not present

## 2023-03-27 DIAGNOSIS — M75122 Complete rotator cuff tear or rupture of left shoulder, not specified as traumatic: Secondary | ICD-10-CM

## 2023-03-27 DIAGNOSIS — F4321 Adjustment disorder with depressed mood: Secondary | ICD-10-CM

## 2023-03-27 DIAGNOSIS — S46012D Strain of muscle(s) and tendon(s) of the rotator cuff of left shoulder, subsequent encounter: Secondary | ICD-10-CM | POA: Diagnosis not present

## 2023-03-27 DIAGNOSIS — M7522 Bicipital tendinitis, left shoulder: Secondary | ICD-10-CM | POA: Diagnosis not present

## 2023-03-27 DIAGNOSIS — E559 Vitamin D deficiency, unspecified: Secondary | ICD-10-CM | POA: Diagnosis not present

## 2023-03-27 DIAGNOSIS — I739 Peripheral vascular disease, unspecified: Secondary | ICD-10-CM | POA: Insufficient documentation

## 2023-03-27 DIAGNOSIS — M25612 Stiffness of left shoulder, not elsewhere classified: Secondary | ICD-10-CM | POA: Diagnosis not present

## 2023-03-27 DIAGNOSIS — M6281 Muscle weakness (generalized): Secondary | ICD-10-CM | POA: Diagnosis not present

## 2023-03-27 NOTE — Patient Instructions (Addendum)
Please consider having Tdap tetanus shot at the pharmacy  Your Forms are signed today  Please continue all other medications as before, and refills have been done if requested.  Please have the pharmacy call with any other refills you may need.  Please continue your efforts at being more active, low cholesterol diet, and weight control.  Please keep your appointments with your specialists as you may have planned  [facility] to hold on lab testing today  Please have your flu shot done at any pharmacy such as [facility]  Please make an Appointment to return in 6 months, or sooner if needed  Good luck with your upcoming right elbow and wrist surguries!

## 2023-03-27 NOTE — Progress Notes (Signed)
Patient ID: Casey Brennan, male   DOB: 1961-03-25, 62 y.o.   MRN: [id number]        Chief Complaint: follow up post recent left shouder surgury x 2 wks with left arm in sling;, grief, cervical disc disorder, low vit d       HPI:  Casey Brennan is a 62 y.o. male here with c/o above, overall doing ok, stitches out yesterday, to start PT in 4 wks.  Mother died recently with CLL, did not want tx.   Will have right elbow nerve release and right CTS surgury soon.  Left neck and shoulder pain much improved after ESI to neck, and left rot cuff surgury.  To start PT soon.  Pt denies chest pain, increased sob or doe, wheezing, orthopnea, PND, increased LE swelling, palpitations, dizziness or syncope.   Pt denies polydipsia, polyuria, or new focal neuro s/s.    Pt denies fever, wt loss, night sweats, loss of appetite, or other constitutional symptoms         Wt Readings from Last 3 Encounters:  03/30/23 217 lb (98.4 kg)  03/27/23 217 lb (98.4 kg)  03/13/23 214 lb 1.1 oz (97.1 kg)   BP Readings from Last 3 Encounters:  03/27/23 130/76  03/13/23 (!) 132/92  02/01/23 116/78         Past Medical History:  Diagnosis Date   Asthma    very rare   Chronic anemia    normocytic   Chronic folliculitis    Chronic kidney disease    Stage 2   COPD (chronic obstructive pulmonary disease) (HCC)    Dyspnea    Environmental and seasonal allergies    Glaucoma    right   H/O pericarditis    2010--  myopercarditis--  resolved   Headache(784.0)    HX SEVERE FRONTAL HA'S   Hiatal hernia    History of gastric ulcer    nonbleeding gastric ulcer 58//24   History of kidney stones    History of MRSA infection 2010   infected boil   HIV (human immunodeficiency virus infection) (HCC) 1988   Hypertension    Hypertensive cardiomyopathy (HCC)    EF 37%, normal coronaries 04/2020 CCTA; LVEF 60-65% 03/2022 echo   Lytic bone lesion of hip    WORK-UP DONE BY ONCOLOGIST DR HA --  NOT MALIGNANT   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   Pneumonia    hx of   Post concussion syndrome    resolved   Prostate cancer (HCC) 04/25/2013   gleason 3+3=6, volume 30 gm   Viral meningitis    06/2021   Past Surgical History:  Procedure Laterality Date   ACDCF  09/29/2020   ANTERIOR CERVICAL DECOMPRESSION/DISCECTOMY FUSION 4 LEVELS N/A 09/29/2020   Procedure: ANTERIOR CERVICAL DECOMPRESSION FUSION CERVICAL 3-CERVICAL 4 , CERVICAL 4 - CERVICAL 5, CERVICAL 5 - CERVICAL 6 WITH INSTRUMENTATION AND ALLOGRAFT;  Surgeon: Estill Bamberg, MD;  Location: [facility] OR;  Service: Orthopedics;  Laterality: N/A;   CARDIAC CATHETERIZATION  03-23-2009  DR COOPER   NORMAL CORONARY ARTERIES   CHOLECYSTECTOMY N/A 11/06/2014   Procedure: LAPAROSCOPIC CHOLECYSTECTOMY WITH INTRAOPERATIVE CHOLANGIOGRAM;  Surgeon: Jimmye Norman, MD;  Location: [facility] OR;  Service: General;  Laterality: N/A;   COLONOSCOPY  12/26/2011   Procedure: COLONOSCOPY;  Surgeon: Shirley Friar, MD;  Location: [facility] ENDOSCOPY;  Service: Endoscopy;  Laterality: N/A;   COLONOSCOPY     COLONOSCOPY WITH PROPOFOL N/A 07/29/2014   Procedure: COLONOSCOPY WITH PROPOFOL;  Surgeon: Shirley Friar, MD;  Location: [facility] ENDOSCOPY;  Service: Endoscopy;  Laterality: N/A;   CYSTOSCOPY N/A 05/18/2018   Procedure: CYSTOSCOPY REMOVAL FOREIGN BODY;  Surgeon: Ihor Gully, MD;  Location: [facility] ORS;  Service: Urology;  Laterality: N/A;   CYSTOSCOPY/RETROGRADE/URETEROSCOPY Bilateral 04/25/2013   Procedure: CYSTOSCOPY/ BILATERAL RETROGRADES; BLADDER BIOPSIES;  Surgeon: Sebastian Ache, MD;  Location: [facility];  Service: Urology;  Laterality: Bilateral;   DENTAL EXAMINATION UNDER ANESTHESIA     ESOPHAGOGASTRODUODENOSCOPY  12/26/2011   Procedure: ESOPHAGOGASTRODUODENOSCOPY (EGD);  Surgeon: Shirley Friar, MD;  Location: [facility];  Service: Endoscopy;  Laterality: N/A;   ESOPHAGOGASTRODUODENOSCOPY (EGD) WITH PROPOFOL N/A 07/29/2014   Procedure: ESOPHAGOGASTRODUODENOSCOPY (EGD)  WITH PROPOFOL;  Surgeon: Shirley Friar, MD;  Location: [facility] ENDOSCOPY;  Service: Endoscopy;  Laterality: N/A;   EXCISION CHRONIC LEFT BREAST ABSCESS  09-21-2010   EXCISIONAL BX LEFT BREAST MASS/  I  &  D LEFT BREAST ABSCESS  03-24-2009   KNEE ARTHROSCOPY Right 1985   left axilla biopsy  04/2013   left hip biopsy  10/2012   LYMPHADENECTOMY Bilateral 08/18/2013   Procedure: LYMPHADENECTOMY "PELVIC LYMPH NODE DISSECTION";  Surgeon: Sebastian Ache, MD;  Location: [facility];  Service: Urology;  Laterality: Bilateral;   POSTERIOR CERVICAL FUSION/FORAMINOTOMY N/A 11/30/2022   Procedure: POSTERIOR DECOMPRESSION AND FUSION CERVICAL 4- CERVICAL 5, CERVICAL 5- CERVICAL 6, CERVICAL 6- CERVICAL 7, CERVICAL 7- THORACIC 1 WITH INSTRUMENTATION AND ALLOGRAFT;  Surgeon: Estill Bamberg, MD;  Location: [facility] OR;  Service: Orthopedics;  Laterality: N/A;   PROSTATE BIOPSY N/A 04/25/2013   Procedure: BIOPSY TRANSRECTAL ULTRASONIC PROSTATE (TUBP);  Surgeon: Sebastian Ache, MD;  Location: [facility];  Service: Urology;  Laterality: N/A;   ROBOT ASSISTED LAPAROSCOPIC RADICAL PROSTATECTOMY N/A 08/18/2013   Procedure: ROBOTIC ASSISTED LAPAROSCOPIC RADICAL PROSTATECTOMY;  Surgeon: Sebastian Ache, MD;  Location: [facility] ORS;  Service: Urology;  Laterality: N/A;   SHOULDER ARTHROSCOPY WITH ROTATOR CUFF REPAIR AND SUBACROMIAL DECOMPRESSION Left 03/13/2023   Procedure: SHOULDER ARTHROSCOPY WITH ROTATOR CUFF REPAIR AND SUBACROMIAL DECOMPRESSION, DISTAL CLAVICLECTOMY, BICEPS TENODESIS;  Surgeon: Sheral Apley, MD;  Location: [facility];  Service: Orthopedics;  Laterality: Left;   TOTAL KNEE ARTHROPLASTY Right 08/01/2016   08/17/16   TOTAL KNEE ARTHROPLASTY Right 08/01/2016   Procedure: TOTAL KNEE ARTHROPLASTY;  Surgeon: Sheral Apley, MD;  Location: [facility] OR;  Service: Orthopedics;  Laterality: Right;   UPPER GASTROINTESTINAL ENDOSCOPY     UPPER LEG SOFT TISSUE BIOPSY Left 2012   lthigh    reports that he  quit smoking about 12 years ago. His smoking use included cigars and cigarettes. He started smoking about 35 years ago. He has a 34.5 pack-year smoking history. He has never used smokeless tobacco. He reports that he does not currently use alcohol. He reports that he does not use drugs. family history includes Asthma in his father and sister; Cancer in his father and maternal uncle; Diabetes in his father; Hypertension in his sister; Stroke in his father. Allergies  Allergen Reactions   Amoxapine Shortness Of Breath   Chocolate Shortness Of Breath and Other (See Comments)    Asthma attack, welts   Ciprofloxacin Anaphylaxis, Dermatitis, Itching, Nausea And Vomiting, Shortness Of Breath, Swelling and Hives   Descovy [Emtricitabine-Tenofovir Af] Shortness Of Breath    Nasuea, vomiiting, diarrhea, sob, whelps, throat closes   Doxycycline Hyclate Shortness Of Breath and Swelling   Genvoya [Elviteg-Cobic-Emtricit-Tenofaf] Shortness Of Breath    Nausea, vomitting, diarreha, sob, rash, throat closing  Morphine And Codeine Shortness Of Breath, Itching and Other (See Comments)     chest pain   Other Anaphylaxis   Oxycodone-Acetaminophen Shortness Of Breath   Penicillins Shortness Of Breath and Rash    Has patient had a PCN reaction causing immediate rash, facial/tongue/throat swelling, SOB or lightheadedness with hypotension: Yes Has patient had a PCN reaction causing severe rash involving mucus membranes or skin necrosis: No Has patient had a PCN reaction that required hospitalization No Has patient had a PCN reaction occurring within the last 10 years: Yes If all of the above answers are "NO", then may proceed with Cephalosporin use.   Sulfa Antibiotics Anaphylaxis and Other (See Comments)   Tomato Shortness Of Breath   Vancomycin Shortness Of Breath and Other (See Comments)   Darunavir Itching   Amlodipine Other (See Comments)    swelling   Carvedilol     LEG SWELLING, DIZZNESS   Flagyl  [Metronidazole] Itching   Meloxicam    Milk-Related Compounds    Morphine Other (See Comments)   Oxycodone Hcl Other (See Comments)   Penicillin G Other (See Comments)   Toradol [Ketorolac Tromethamine] Nausea And Vomiting    itching   Acetaminophen Rash and Other (See Comments)    Pt tolerates percocet and also plain APAP without rash   Tramadol Itching, Nausea And Vomiting and Other (See Comments)   Current Outpatient Medications on File Prior to Visit  Medication Sig Dispense Refill   albuterol (PROVENTIL) (2.5 MG/3ML) 0.083% nebulizer solution Take 3 mLs (2.5 mg total) by nebulization every 6 (six) hours as needed for wheezing or shortness of breath. 75 mL 3   albuterol (VENTOLIN HFA) 108 (90 Base) MCG/ACT inhaler Inhale 2 puffs into the lungs every 6 (six) hours as needed for wheezing or shortness of breath. 36 g 3   ALPRAZolam (XANAX) 0.5 MG tablet TAKE 1 TABLET(0.5 MG) BY MOUTH TWICE DAILY AS NEEDED FOR SLEEP 60 tablet 5   AMBULATORY NON FORMULARY MEDICATION Budesonide slurry 2 mg/10 ml-  Give 2 mg (10 ml) twice daily x 4 weeks; pt to swallow; npo 30 minutes after to avoid steroid washing from esophagus 560 mL 1   azelastine (ASTELIN) 0.1 % nasal spray Place 2 sprays into both nostrils in the morning. Use in each nostril as directed     BIKTARVY 50-200-25 MG TABS tablet TAKE 1 TABLET BY MOUTH DAILY 30 tablet 11   cetirizine (ZYRTEC) 10 MG tablet TAKE 1 TABLET(10 MG) BY MOUTH DAILY 90 tablet 3   Cholecalciferol (VITAMIN D3) 250 MCG (10000 UT) capsule Take 1 tablet by mouth daily. [employer]'s [employer]     cyclobenzaprine (FLEXERIL) 10 MG tablet Take 1 tablet (10 mg total) by mouth at bedtime. 90 tablet 3   diphenhydrAMINE (BENADRYL) 25 MG tablet Take 1 tablet (25 mg total) by mouth every 6 (six) hours as needed. (Patient taking differently: Take 25 mg by mouth every 6 (six) hours as needed for sleep, itching or allergies.) 30 tablet 0   dorzolamide (TRUSOPT) 2 % ophthalmic solution Place 1  drop into both eyes 2 (two) times daily.     EPINEPHrine 0.3 mg/0.3 mL IJ SOAJ injection Inject 0.3 mg into the muscle as needed for anaphylaxis. 1 each 5   gabapentin (NEURONTIN) 300 MG capsule TAKE 1 CAPSULE(300 MG) BY MOUTH FOUR TIMES DAILY AS NEEDED FOR PAIN 120 capsule 4   Glucos-Chond-Hyal Ac-Ca Fructo (MOVE FREE JOINT HEALTH ADVANCE PO) Take 1 tablet by mouth daily.  HYDROcodone-acetaminophen (NORCO) 7.5-325 MG tablet Take 1-2 tablets by mouth every 6 (six) hours as needed for severe pain. 40 tablet 0   hydrOXYzine (ATARAX) 50 MG tablet TAKE 1 TABLET(50 MG) BY MOUTH TWICE DAILY 60 tablet 1   meclizine (ANTIVERT) 25 MG tablet Take 1 tablet (25 mg total) by mouth daily as needed. 40 tablet 5   methocarbamol (ROBAXIN) 500 MG tablet Take 500 mg by mouth 4 (four) times daily.     methocarbamol (ROBAXIN) 500 MG tablet Take 1-2 tablets (500-1,000 mg total) by mouth every 6 (six) hours as needed for muscle spasms. 30 tablet 2   montelukast (SINGULAIR) 10 MG tablet Take 10 mg by mouth daily.     Multiple Vitamin (MULTIVITAMIN WITH MINERALS) TABS tablet Take 1 tablet by mouth daily after breakfast.      mupirocin ointment (BACTROBAN) 2 % APPLY EXTERNALLY TO THE AFFECTED AREA TWICE DAILY FOR 3 WEEKS, PLACE COTTON BETWEEN TOES TO ALLOW BETTER AERATION AS NEEDED (Patient taking differently: Apply 1 application  topically daily as needed (affected toes).) 66 g 1   ondansetron (ZOFRAN-ODT) 4 MG disintegrating tablet Take 1 tablet (4 mg total) by mouth every 8 (eight) hours as needed for nausea or vomiting. (Patient taking differently: Take 4 mg by mouth every 4 (four) hours as needed for nausea or vomiting.) 30 tablet 2   OVER THE COUNTER MEDICATION Take 2 capsules by mouth daily. Zhou: thyroid supplement     pantoprazole (PROTONIX) 40 MG tablet Take 1 tablet (40 mg total) by mouth 2 (two) times daily before a meal. 180 tablet 3   QNASL 80 MCG/ACT AERS USE 2 SPRAYS IN EACH NOSTRIL DAILY (Patient taking  differently: Place 2 sprays into both nostrils in the morning and at bedtime.) 10.6 g 2   QULIPTA 30 MG TABS Take 1 tablet (30 mg total) by mouth daily. 30 tablet 5   rosuvastatin (CRESTOR) 20 MG tablet TAKE 1 TABLET(20 MG) BY MOUTH DAILY (Patient taking differently: Take 20 mg by mouth daily.) 90 tablet 3   tizanidine (ZANAFLEX) 6 MG capsule Take 6 mg by mouth 3 (three) times daily.     torsemide (DEMADEX) 20 MG tablet Take 10 mg by mouth See admin instructions. Tuesday Thursday Saturday     Ubrogepant (UBRELVY) 100 MG TABS Take 1 tablet (100 mg total) by mouth as needed (take 1 tablet at onset of migraine. may take an additional tablet 2 hrs later if needed (Do not exceed more than 2 tablets in 24 hour period)). 16 tablet 5   nitroGLYCERIN (NITROSTAT) 0.4 MG SL tablet Place 1 tablet (0.4 mg total) under the tongue every 5 (five) minutes as needed for chest pain. 30 tablet 1   [DISCONTINUED] zolpidem (AMBIEN) 5 MG tablet Take 1-2 tablets (5-10 mg total) by mouth at bedtime as needed for sleep. (Patient not taking: Reported on 04/23/2019) 30 tablet 2   No current facility-administered medications on file prior to visit.        ROS:  All others reviewed and negative.  Objective        PE:  BP 130/76 (BP Location: Right Arm, Patient Position: Sitting, Cuff Size: Normal)   Pulse 65   Temp 98.2 F (36.8 C) (Oral)   Ht 5\' 7"  (1.702 m)   Wt 217 lb (98.4 kg)   SpO2 96%   BMI 33.99 kg/m                 Constitutional: Pt appears in NAD  HENT: Head: NCAT.                Right Ear: External ear normal.                 Left Ear: External ear normal.                Eyes: . Pupils are equal, round, and reactive to light. Conjunctivae and EOM are normal               Nose: without d/c or deformity               Neck: Neck supple. Gross normal ROM               Cardiovascular: Normal rate and regular rhythm.                 Pulmonary/Chest: Effort normal and breath sounds without rales  or wheezing.                Abd:  Soft, NT, ND, + BS, no organomegaly               Neurological: Pt is alert. At baseline orientation, motor grossly intact               Skin: Skin is warm. No rashes, no other new lesions, LE edema - none               Psychiatric: Pt behavior is normal without agitation   Micro: none  Cardiac tracings I have personally interpreted today:  none  Pertinent Radiological findings (summarize): none   Lab Results  Component Value Date   WBC 5.8 12/06/2022   HGB 12.6 (L) 12/06/2022   HCT 38.0 (L) 12/06/2022   PLT 292 12/06/2022   GLUCOSE 96 03/12/2023   CHOL 139 08/14/2022   TRIG 101.0 08/14/2022   HDL 39.10 08/14/2022   LDLCALC 80 08/14/2022   ALT 53 (H) 12/06/2022   AST 40 12/06/2022   NA 135 03/12/2023   K 3.6 03/12/2023   CL 105 03/12/2023   CREATININE 1.17 03/12/2023   BUN 7 03/12/2023   CO2 25 03/12/2023   TSH 2.51 08/14/2022   PSA 0.00 (L) 08/14/2022   INR 1.0 06/23/2021   HGBA1C <4.2 08/15/2022   MICROALBUR 16.88 (H) 10/05/2011   Assessment/Plan:  Casey Brennan is a 62 y.o. 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  Cervical disc disorder with radiculopathy of cervical region Improved, not likely to need surgury it seems as much imrpoved at least for now with  ESI  Grief Mild to mod, declines referral for counseling  Left rotator cuff tear S/p recent surgury, pain gradually improving, left arm in sling, for PT start soon.  Vitamin D deficiency Last vitamin D Lab Results  Component Value Date   VD25OH 27.12 (L) 08/14/2022   Low, to start oral replacement  Followup: Return in about 6 months (around 09/24/2023).  Fayrene Fearing  Jonny Ruiz, MD 03/30/2023 9:53 PM [facility] Internal Medicine

## 2023-03-30 ENCOUNTER — Ambulatory Visit (INDEPENDENT_AMBULATORY_CARE_PROVIDER_SITE_OTHER): Payer: 59

## 2023-03-30 ENCOUNTER — Encounter: Payer: Self-pay | Admitting: Internal Medicine

## 2023-03-30 ENCOUNTER — Telehealth: Payer: Self-pay | Admitting: Internal Medicine

## 2023-03-30 VITALS — Ht 69.0 in | Wt 217.0 lb

## 2023-03-30 DIAGNOSIS — Z0000 Encounter for general adult medical examination without abnormal findings: Secondary | ICD-10-CM | POA: Diagnosis not present

## 2023-03-30 DIAGNOSIS — Z87891 Personal history of nicotine dependence: Secondary | ICD-10-CM

## 2023-03-30 DIAGNOSIS — F4321 Adjustment disorder with depressed mood: Secondary | ICD-10-CM | POA: Insufficient documentation

## 2023-03-30 NOTE — Patient Instructions (Addendum)
Casey Brennan , Thank you for taking time to come for your Medicare Wellness Visit. I appreciate your ongoing commitment to your health goals. Please review the following plan we discussed and let me know if I can assist you in the future.   Referrals/Orders/Follow-Ups/Clinician Recommendations: You are due for a Tetanus vaccine.  Remember to call  [facility] at [phone] to schedule your lung cancer screening.  It was nice speaking to your sister today.  Take care.  This is a list of the screening recommended for you and due dates:  Health Maintenance  Topic Date Due   DTaP/Tdap/Td vaccine (2 - Td or Tdap) 06/11/2021   COVID-19 Vaccine (6 - 2023-24 season) 03/04/2023   Flu Shot  10/01/2023*   Screening for Lung Cancer  10/09/2023   Medicare Annual Wellness Visit  03/29/2024   Colon Cancer Screening  01/17/2030   Hepatitis C Screening  Completed   HIV Screening  Completed   Zoster (Shingles) Vaccine  Completed   HPV Vaccine  Aged Out  *Topic was postponed. The date shown is not the original due date.    Advanced directives: (Copy Requested) Please bring a copy of your health care power of attorney and living will to the office to be added to your chart at your convenience.  Next Medicare Annual Wellness Visit scheduled for next year: Yes

## 2023-03-30 NOTE — Telephone Encounter (Signed)
Ok to send to the [employer] provider

## 2023-03-30 NOTE — Assessment & Plan Note (Signed)
Improved, not likely to need surgury it seems as much imrpoved at least for now with [facility]

## 2023-03-30 NOTE — Progress Notes (Signed)
Subjective:   Casey Brennan is a 62 y.o. male who presents for Medicare Annual/Subsequent preventive examination.  Visit Complete: Virtual  I connected with  Casey Brennan on 03/30/23 by a audio enabled telemedicine application and verified that I am speaking with the correct person using two identifiers.  Patient Location: Home  Provider Location: Home Office  I discussed the limitations of evaluation and management by telemedicine. The patient expressed understanding and agreed to proceed.  Patient Medicare AWV questionnaire was completed by the patient on 03/27/2023; I have confirmed that all information answered by patient is correct and no changes since this date.  Cardiac Risk Factors include: advanced age (>39men, >80 women);male gender;Other (see comment);dyslipidemia, Risk factor comments: PVD, COPD, AKI, HIV     Objective:    Today's Vitals   03/27/23 1534 03/30/23 1429  Weight:  217 lb (98.4 kg)  Height:  5\' 9"  (1.753 m)  PainSc: 8     Body mass index is 32.05 kg/m.     03/30/2023    2:45 PM 03/13/2023    6:28 AM 03/06/2023   11:06 AM 12/06/2022    6:03 PM 11/23/2022    1:33 PM 10/31/2022    1:39 PM 04/07/2022    9:24 AM  Advanced Directives  Does Patient Have a Medical Advance Directive? Yes Yes Yes Yes Yes Yes Yes  Type of Sales promotion account executive of [employer] Power of [place];Living will Healthcare Power of [place];Living will Healthcare Power of [place];Living will Healthcare Power of [place];Living will Healthcare Power of [place];Living will  Does patient want to make changes to medical advance directive?  No - Patient declined   No - Patient declined  No - Patient declined  Copy of Healthcare Power of Attorney in Chart? No - copy requested No - copy requested   Yes - validated most recent copy scanned in chart (See row information) Yes - validated most recent copy scanned in chart (See row information) Yes  - validated most recent copy scanned in chart (See row information)    Current Medications (verified) Outpatient Encounter Medications as of 03/30/2023  Medication Sig   albuterol (PROVENTIL) (2.5 MG/3ML) 0.083% nebulizer solution Take 3 mLs (2.5 mg total) by nebulization every 6 (six) hours as needed for wheezing or shortness of breath.   albuterol (VENTOLIN HFA) 108 (90 Base) MCG/ACT inhaler Inhale 2 puffs into the lungs every 6 (six) hours as needed for wheezing or shortness of breath.   ALPRAZolam (XANAX) 0.5 MG tablet TAKE 1 TABLET(0.5 MG) BY MOUTH TWICE DAILY AS NEEDED FOR SLEEP   AMBULATORY NON FORMULARY MEDICATION Budesonide slurry 2 mg/10 ml-  Give 2 mg (10 ml) twice daily x 4 weeks; pt to swallow; npo 30 minutes after to avoid steroid washing from esophagus   azelastine (ASTELIN) 0.1 % nasal spray Place 2 sprays into both nostrils in the morning. Use in each nostril as directed   BIKTARVY 50-200-25 MG TABS tablet TAKE 1 TABLET BY MOUTH DAILY   BREZTRI AEROSPHERE 160-9-4.8 MCG/ACT AERO INHALE 2 PUFFS INTO THE LUNGS IN THE MORNING AND AT BEDTIME   cetirizine (ZYRTEC) 10 MG tablet TAKE 1 TABLET(10 MG) BY MOUTH DAILY   Cholecalciferol (VITAMIN D3) 250 MCG (10000 UT) capsule Take 1 tablet by mouth daily. [employer]'s [employer]   cyclobenzaprine (FLEXERIL) 10 MG tablet Take 1 tablet (10 mg total) by mouth at bedtime.   diphenhydrAMINE (BENADRYL) 25 MG tablet Take 1 tablet (25 mg total) by  mouth every 6 (six) hours as needed. (Patient taking differently: Take 25 mg by mouth every 6 (six) hours as needed for sleep, itching or allergies.)   dorzolamide (TRUSOPT) 2 % ophthalmic solution Place 1 drop into both eyes 2 (two) times daily.   EPINEPHrine 0.3 mg/0.3 mL IJ SOAJ injection Inject 0.3 mg into the muscle as needed for anaphylaxis.   gabapentin (NEURONTIN) 300 MG capsule TAKE 1 CAPSULE(300 MG) BY MOUTH FOUR TIMES DAILY AS NEEDED FOR PAIN   Glucos-Chond-Hyal Ac-Ca Fructo (MOVE FREE JOINT HEALTH ADVANCE  PO) Take 1 tablet by mouth daily.   HYDROcodone-acetaminophen (NORCO) 7.5-325 MG tablet Take 1-2 tablets by mouth every 6 (six) hours as needed for severe pain.   hydrOXYzine (ATARAX) 50 MG tablet TAKE 1 TABLET(50 MG) BY MOUTH TWICE DAILY   meclizine (ANTIVERT) 25 MG tablet Take 1 tablet (25 mg total) by mouth daily as needed.   methocarbamol (ROBAXIN) 500 MG tablet Take 500 mg by mouth 4 (four) times daily.   methocarbamol (ROBAXIN) 500 MG tablet Take 1-2 tablets (500-1,000 mg total) by mouth every 6 (six) hours as needed for muscle spasms.   metoprolol succinate (TOPROL-XL) 100 MG 24 hr tablet TAKE 1 TABLET(100 MG) BY MOUTH DAILY WITH OR IMMEDIATELY FOLLOWING A MEAL   montelukast (SINGULAIR) 10 MG tablet Take 10 mg by mouth daily.   Multiple Vitamin (MULTIVITAMIN WITH MINERALS) TABS tablet Take 1 tablet by mouth daily after breakfast.    mupirocin ointment (BACTROBAN) 2 % APPLY EXTERNALLY TO THE AFFECTED AREA TWICE DAILY FOR 3 WEEKS, PLACE COTTON BETWEEN TOES TO ALLOW BETTER AERATION AS NEEDED (Patient taking differently: Apply 1 application  topically daily as needed (affected toes).)   ondansetron (ZOFRAN-ODT) 4 MG disintegrating tablet Take 1 tablet (4 mg total) by mouth every 8 (eight) hours as needed for nausea or vomiting. (Patient taking differently: Take 4 mg by mouth every 4 (four) hours as needed for nausea or vomiting.)   OVER THE COUNTER MEDICATION Take 2 capsules by mouth daily. Zhou: thyroid supplement   pantoprazole (PROTONIX) 40 MG tablet Take 1 tablet (40 mg total) by mouth 2 (two) times daily before a meal.   QNASL 80 MCG/ACT AERS USE 2 SPRAYS IN EACH NOSTRIL DAILY (Patient taking differently: Place 2 sprays into both nostrils in the morning and at bedtime.)   QULIPTA 30 MG TABS Take 1 tablet (30 mg total) by mouth daily.   rosuvastatin (CRESTOR) 20 MG tablet TAKE 1 TABLET(20 MG) BY MOUTH DAILY (Patient taking differently: Take 20 mg by mouth daily.)   tizanidine (ZANAFLEX) 6 MG  capsule Take 6 mg by mouth 3 (three) times daily.   torsemide (DEMADEX) 20 MG tablet Take 10 mg by mouth See admin instructions. Tuesday Thursday Saturday   Ubrogepant (UBRELVY) 100 MG TABS Take 1 tablet (100 mg total) by mouth as needed (take 1 tablet at onset of migraine. may take an additional tablet 2 hrs later if needed (Do not exceed more than 2 tablets in 24 hour period)).   nitroGLYCERIN (NITROSTAT) 0.4 MG SL tablet Place 1 tablet (0.4 mg total) under the tongue every 5 (five) minutes as needed for chest pain.   [DISCONTINUED] zolpidem (AMBIEN) 5 MG tablet Take 1-2 tablets (5-10 mg total) by mouth at bedtime as needed for sleep. (Patient not taking: Reported on 04/23/2019)   No facility-administered encounter medications on file as of 03/30/2023.    Allergies (verified) Amoxapine, Chocolate, Ciprofloxacin, Descovy [emtricitabine-tenofovir af], Doxycycline hyclate, Genvoya [elviteg-cobic-emtricit-tenofaf], Morphine and codeine,  Other, Oxycodone-acetaminophen, Penicillins, Sulfa antibiotics, Tomato, Vancomycin, Darunavir, Amlodipine, Carvedilol, Flagyl [metronidazole], Meloxicam, Milk-related compounds, Morphine, Oxycodone hcl, Penicillin g, Toradol [ketorolac tromethamine], Acetaminophen, and Tramadol   History: Past Medical History:  Diagnosis Date   Asthma    very rare   Chronic anemia    normocytic   Chronic folliculitis    Chronic kidney disease    Stage 2   COPD (chronic obstructive pulmonary disease) (HCC)    Dyspnea    Environmental and seasonal allergies    Glaucoma    right   H/O pericarditis    2010--  myopercarditis--  resolved   Headache(784.0)    HX SEVERE FRONTAL HA'S   Hiatal hernia    History of gastric ulcer    nonbleeding gastric ulcer 58//24   History of kidney stones    History of MRSA infection 2010   infected boil   HIV (human immunodeficiency virus infection) (HCC) 1988   Hypertension    Hypertensive cardiomyopathy (HCC)    EF 37%, normal  coronaries 04/2020 CCTA; LVEF 60-65% 03/2022 echo   Lytic bone lesion of hip    WORK-UP DONE BY ONCOLOGIST DR HA --  NOT MALIGNANT   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   Pneumonia    hx of   Post concussion syndrome    resolved   Prostate cancer (HCC) 04/25/2013   gleason 3+3=6, volume 30 gm   Viral meningitis    06/2021   Past Surgical History:  Procedure Laterality Date   ACDCF  09/29/2020   ANTERIOR CERVICAL DECOMPRESSION/DISCECTOMY FUSION 4 LEVELS N/A 09/29/2020   Procedure: ANTERIOR CERVICAL DECOMPRESSION FUSION CERVICAL 3-CERVICAL 4 , CERVICAL 4 - CERVICAL 5, CERVICAL 5 - CERVICAL 6 WITH INSTRUMENTATION AND ALLOGRAFT;  Surgeon: Estill Bamberg, MD;  Location: [facility] OR;  Service: Orthopedics;  Laterality: N/A;   CARDIAC CATHETERIZATION  03-23-2009  DR COOPER   NORMAL CORONARY ARTERIES   CHOLECYSTECTOMY N/A 11/06/2014   Procedure: LAPAROSCOPIC CHOLECYSTECTOMY WITH INTRAOPERATIVE CHOLANGIOGRAM;  Surgeon: Jimmye Norman, MD;  Location[facility];  Service: General;  Laterality: N/A;   COLONOSCOPY  12/26/2011   Procedure: COLONOSCOPY;  Surgeon: Shirley Friar, MD;  Location: [facility] ENDOSCOPY;  Service: Endoscopy;  Laterality: N/A;   COLONOSCOPY     COLONOSCOPY WITH PROPOFOL N/A 07/29/2014   Procedure: COLONOSCOPY WITH PROPOFOL;  Surgeon: Shirley Friar, MD;  Location: [facility] ENDOSCOPY;  Service: Endoscopy;  Laterality: N/A;   CYSTOSCOPY N/A 05/18/2018   Procedure: CYSTOSCOPY REMOVAL FOREIGN BODY;  Surgeon: Ihor Gully, MD;  Location: [facility] ORS;  Service: Urology;  Laterality: N/A;   CYSTOSCOPY/RETROGRADE/URETEROSCOPY Bilateral 04/25/2013   Procedure: CYSTOSCOPY/ BILATERAL RETROGRADES; BLADDER BIOPSIES;  Surgeon: Sebastian Ache, MD;  Location: [facility];  Service: Urology;  Laterality: Bilateral;   DENTAL EXAMINATION UNDER ANESTHESIA     ESOPHAGOGASTRODUODENOSCOPY  12/26/2011   Procedure: ESOPHAGOGASTRODUODENOSCOPY (EGD);  Surgeon: Shirley Friar, MD;  Location: [facility];  Service: Endoscopy;  Laterality: N/A;   ESOPHAGOGASTRODUODENOSCOPY (EGD) WITH PROPOFOL N/A 07/29/2014   Procedure: ESOPHAGOGASTRODUODENOSCOPY (EGD) WITH PROPOFOL;  Surgeon: Shirley Friar, MD;  Location[facility] ENDOSCOPY;  Service: Endoscopy;  Laterality: N/A;   EXCISION CHRONIC LEFT BREAST ABSCESS  09-21-2010   EXCISIONAL BX LEFT BREAST MASS/  I  &  D LEFT BREAST ABSCESS  03-24-2009   KNEE ARTHROSCOPY Right 1985   left axilla biopsy  04/2013   left hip biopsy  10/2012   LYMPHADENECTOMY Bilateral 08/18/2013   Procedure: LYMPHADENECTOMY "PELVIC LYMPH NODE DISSECTION";  Surgeon: Normand Sloop  Berneice Heinrich, MD;  Location: [facility];  Service: Urology;  Laterality: Bilateral;   POSTERIOR CERVICAL FUSION/FORAMINOTOMY N/A 11/30/2022   Procedure: POSTERIOR DECOMPRESSION AND FUSION CERVICAL 4- CERVICAL 5, CERVICAL 5- CERVICAL 6, CERVICAL 6- CERVICAL 7, CERVICAL 7- THORACIC 1 WITH INSTRUMENTATION AND ALLOGRAFT;  Surgeon: Estill Bamberg, MD;  Location: [facility] OR;  Service: Orthopedics;  Laterality: N/A;   PROSTATE BIOPSY N/A 04/25/2013   Procedure: BIOPSY TRANSRECTAL ULTRASONIC PROSTATE (TUBP);  Surgeon: Sebastian Ache, MD;  Location: [facility];  Service: Urology;  Laterality: N/A;   ROBOT ASSISTED LAPAROSCOPIC RADICAL PROSTATECTOMY N/A 08/18/2013   Procedure: ROBOTIC ASSISTED LAPAROSCOPIC RADICAL PROSTATECTOMY;  Surgeon: Sebastian Ache, MD;  Location: [facility]S;  Service: Urology;  Laterality: N/A;   SHOULDER ARTHROSCOPY WITH ROTATOR CUFF REPAIR AND SUBACROMIAL DECOMPRESSION Left 03/13/2023   Procedure: SHOULDER ARTHROSCOPY WITH ROTATOR CUFF REPAIR AND SUBACROMIAL DECOMPRESSION, DISTAL CLAVICLECTOMY, BICEPS TENODESIS;  Surgeon: Sheral Apley, MD;  Location: [facility];  Service: Orthopedics;  Laterality: Left;   TOTAL KNEE ARTHROPLASTY Right 08/01/2016   08/17/16   TOTAL KNEE ARTHROPLASTY Right 08/01/2016   Procedure: TOTAL KNEE ARTHROPLASTY;  Surgeon: Sheral Apley, MD;  Location:  [facility] OR;  Service: Orthopedics;  Laterality: Right;   UPPER GASTROINTESTINAL ENDOSCOPY     UPPER LEG SOFT TISSUE BIOPSY Left 2012   lthigh   Family History  Problem Relation Age of Onset   Stroke Father    Diabetes Father    Cancer Father        brain cancer   Asthma Father    Hypertension Sister    Cancer Maternal Uncle        prostate cancer   Asthma Sister    Colon cancer Neg Hx    Colon polyps Neg Hx    Esophageal cancer Neg Hx    Stomach cancer Neg Hx    Rectal cancer Neg Hx    Social History   Socioeconomic History   Marital status: Single    Spouse name: Not on file   Number of children: 1   Years of education: College   Highest education level: Bachelor's degree (e.g., BA, AB, BS)  Occupational History   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 declined condoms  Other Topics Concern   Not on file  Social History Narrative   Sister April stays with him currently although he hopes to return to truck driving. 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 Sister is starting medical school next year.   Patient is divorced and lives with his sister.   Patient has one adult son.   Patient is part-time, driving a truck.   Patient has a college education.   Patient is right-handed.   Patient drinks 2-3 cups  of coffee and a 4-5 cups of tea.   Social Determinants of  Health   Financial Resource Strain: Medium Risk (03/27/2023)   Overall Financial Resource Strain (CARDIA)    Difficulty of Paying Living Expenses: Somewhat hard  Food Insecurity: Food Insecurity Present (03/27/2023)   Hunger Vital Sign    Worried About Running Out of Food in the Last Year: Sometimes true    Ran Out of Food in the Last Year: Sometimes true  Transportation Needs: No Transportation Needs (03/27/2023)   PRAPARE - Administrator, Civil Service (Medical): No    Lack of Transportation (Non-Medical): No  Physical Activity: Insufficiently Active (03/27/2023)   Exercise Vital Sign    Days of Exercise per Week: 2 days    Minutes of Exercise per Session: 10 min  Stress: No Stress Concern Present (03/27/2023)   [employer] of Occupational Health - Occupational Stress Questionnaire    Feeling of Stress : Not at all  Social Connections: Moderately Integrated (03/27/2023)   Social Connection and Isolation Panel [NHANES]    Frequency of Communication with Friends and Family: More than three times a week    Frequency of Social Gatherings with Friends and Family: Twice a week    Attends Religious Services: More than 4 times per year    Active Member of [employer] or Organizations: Yes    Attends Engineer, structural: More than 4 times per year    Marital Status: Divorced    Tobacco Counseling Counseling given: Not Answered   Clinical Intake:  Pre-visit preparation completed: Yes  Pain : 0-10 (Patient just had surgery) Pain Score: 8      BMI - recorded: 32.05 Nutritional Status: BMI > 30  Obese Nutritional Risks: None  How often do you need to have someone help you when you read instructions, pamphlets, or other written materials from your doctor or pharmacy?: 1 - Never  Interpreter Needed?: No  Information entered by :: Marlisha Vanwyk, RMA   Activities of Daily Living    03/27/2023    3:34 PM 03/13/2023    6:33 AM  In your present state of health,  do you have any difficulty performing the following activities:  Hearing? 0 0  Vision? 0 0  Difficulty concentrating or making decisions? 0 0  Walking or climbing stairs? 0 0  Dressing or bathing? 0 0  Doing errands, shopping? 0   Preparing Food and eating ? N   Using the Toilet? N   In the past six months, have you accidently leaked urine? N   Do you have problems with loss of bowel control? N   Managing your Medications? N   Managing your Finances? N   Housekeeping or managing your Housekeeping? N     Patient Care Team: Corwin Levins, MD as PCP - General (Internal Medicine) Alfredo Martinez, MD as Attending Physician (Urology) Charlott Rakes, MD as Attending Physician (Gastroenterology) Emelia Loron, MD as Attending Physician (General Surgery) Dagoberto Ligas, MD (Nephrology) Exie Parody, MD (Hematology and Oncology) Laurier Nancy, OD as Consulting Physician (Optometry) Bond, Doran Stabler, MD as Referring Physician (Ophthalmology)  Indicate any recent Medical Services you may have received from other than [facility] providers in the past year (date may be approximate).     Assessment:   This is a routine wellness examination for Casey Brennan.  Hearing/Vision screen Hearing Screening - Comments:: Denies hearing difficulties   Vision Screening - Comments:: Wears eyeglasses   Goals Addressed  None   Depression Screen    03/30/2023    3:14 PM 03/27/2023    1:11 PM 01/26/2023    1:42 PM 12/15/2022    2:26 PM 10/31/2022    1:38 PM 10/17/2022    2:51 PM 08/14/2022    2:42 PM  PHQ 2/9 Scores  PHQ - 2 Score 0 0 0 0 0 0 0  PHQ- 9 Score 0      0    Fall Risk    03/27/2023    3:34 PM 03/27/2023    1:11 PM 01/26/2023    1:41 PM 12/15/2022    2:25 PM 10/31/2022    1:38 PM  Fall Risk   Falls in the past year? 1 0 0 1 1  Number falls in past yr: 1 0 0 1 1  Injury with Fall? 1 0 0 0 1  Comment    torn tendon in rotator cuff   Risk for fall due to :  No Fall Risks No Fall  Risks No Fall Risks Impaired balance/gait  Follow up Falls evaluation completed;Falls prevention discussed Falls evaluation completed Falls evaluation completed Falls evaluation completed Falls evaluation completed;Falls prevention discussed    MEDICARE RISK AT HOME: Medicare Risk at Home Any stairs in or around the home?: Yes If so, are there any without handrails?: Yes Home free of loose throw rugs in walkways, pet beds, electrical cords, etc?: Yes Adequate lighting in your home to reduce risk of falls?: Yes Life alert?: No Use of a cane, walker or w/c?: No Grab bars in the bathroom?: No Shower chair or bench in shower?: No Elevated toilet seat or a handicapped toilet?: Yes  TIMED UP AND GO:  Was the test performed?  No    Cognitive Function:    6CIT was not done today due to patient's sister doing the visit , as patient just had surgery.    03/30/2023    3:02 PM 04/07/2022    9:37 AM  6CIT Screen  What Year? -- 0 points  What month?  0 points  What time?  0 points  Count back from 20  0 points  Months in reverse  0 points  Repeat phrase  0 points  Total Score  0 points    Immunizations Immunization History  Administered Date(s) Administered   Hepatitis A 06/12/2011   Influenza Split 06/12/2012   Influenza Whole 04/19/2009, 07/25/2010, 02/22/2011   Influenza, High Dose Seasonal PF 06/02/2021   Influenza,inj,Quad PF,6+ Mos 06/04/2013, 04/27/2014, 07/21/2015, 05/22/2016, 04/27/2017, 05/20/2020, 04/06/2022   Influenza-Unspecified 01/30/2015   Meningococcal Mcv4o 05/22/2016   PFIZER Comirnaty(Gray Top)Covid-19 Tri-Sucrose Vaccine 05/25/2020   PFIZER(Purple Top)SARS-COV-2 Vaccination 10/23/2019, 11/13/2019, 05/25/2020   PPD Test 05/05/2009   Pfizer Covid-19 Vaccine Bivalent Booster 47yrs & up 04/08/2021   Pneumococcal Conjugate-13 11/11/2020   Pneumococcal Polysaccharide-23 03/22/2009, 08/19/2013, 11/15/2020   Tdap 06/12/2011   Zoster Recombinant(Shingrix) 12/27/2020,  04/06/2022, 06/14/2022    TDAP status: Due, Education has been provided regarding the importance of this vaccine. Advised may receive this vaccine at local pharmacy or Health Dept. Aware to provide a copy of the vaccination record if obtained from local pharmacy or Health Dept. Verbalized acceptance and understanding.  Flu Vaccine status: Up to date  Pneumococcal vaccine status: Up to date  Covid-19 vaccine status: Completed vaccines  Qualifies for Shingles Vaccine? Yes   Zostavax completed Yes   Shingrix Completed?: Yes  Screening Tests Health Maintenance  Topic Date Due   DTaP/Tdap/Td (2 - Td or  Tdap) 06/11/2021   COVID-19 Vaccine (6 - 2023-24 season) 03/04/2023   INFLUENZA VACCINE  10/01/2023 (Originally 02/01/2023)   Lung Cancer Screening  10/09/2023   Medicare Annual Wellness (AWV)  03/29/2024   Colonoscopy  01/17/2030   Hepatitis C Screening  Completed   HIV Screening  Completed   Zoster Vaccines- Shingrix  Completed   HPV VACCINES  Aged Out    Health Maintenance  Health Maintenance Due  Topic Date Due   DTaP/Tdap/Td (2 - Td or Tdap) 06/11/2021   COVID-19 Vaccine (6 - 2023-24 season) 03/04/2023    Colorectal cancer screening: Type of screening: Colonoscopy. Completed 01/18/2023. Repeat every 7 years  Lung Cancer Screening: (Low Dose CT Chest recommended if Age 60-80 years, 20 pack-year currently smoking OR have quit w/in 15years.) does qualify.   Lung Cancer Screening Referral: 03/30/2023  Additional Screening:  Hepatitis C Screening: does qualify; Completed 04/19/2009  Vision Screening: Recommended annual ophthalmology exams for early detection of glaucoma and other disorders of the eye. Is the patient up to date with their annual eye exam?  Yes  Who is the provider or what is the name of the office in which the patient attends annual eye exams? Dr Bond/ Dr. Rosita Kea If pt is not established with a provider, would they like to be referred to a provider to establish  care? Yes .   Dental Screening: Recommended annual dental exams for proper oral hygiene   Community Resource Referral / Chronic Care Management: CRR required this visit?  No   CCM required this visit?  No     Plan:     I have personally reviewed and noted the following in the patient's chart:   Medical and social history Use of alcohol, tobacco or illicit drugs  Current medications and supplements including opioid prescriptions. Patient is not currently taking opioid prescriptions. Functional ability and status Nutritional status Physical activity Advanced directives List of other physicians Hospitalizations, surgeries, and ER visits in previous 12 months Vitals Screenings to include cognitive, depression, and falls Referrals and appointments  In addition, I have reviewed and discussed with patient certain preventive protocols, quality metrics, and best practice recommendations. A written personalized care plan for preventive services as well as general preventive health recommendations were provided to patient.     Roselie Cirigliano L Chaley Castellanos, CMA   03/30/2023   After Visit Summary: (MyChart) Due to this being a telephonic visit, the after visit summary with patients personalized plan was offered to patient via MyChart   Nurse Notes: Patient is due for a Tdap and he is aware that he can get this at his local pharmacy.  Patient is eligible for a lung cancer screening, which an order has been placed today.  He is aware that he has to call to schedule the appointment.

## 2023-03-30 NOTE — Assessment & Plan Note (Signed)
S/p recent surgury, pain gradually improving, left arm in sling, for PT start soon.

## 2023-03-30 NOTE — Assessment & Plan Note (Signed)
Last vitamin D Lab Results  Component Value Date   VD25OH 27.12 (L) 08/14/2022   Low, to start oral replacement  

## 2023-03-30 NOTE — Telephone Encounter (Signed)
During Patient's medicare wellness visit - Casey Brennan told patient that he needs a lung cancer screening done and that he can get it done at [facility]  An order will need to be put in the system first.  Please call patient and let him know when this has been completed.  [phone]

## 2023-03-30 NOTE — Assessment & Plan Note (Signed)
Mild to mod, declines referral for counseling

## 2023-04-02 NOTE — Telephone Encounter (Signed)
No I refuse as I cannot my work and the [employer] provider as well  Referral should be placed to pulmonary for the LDCT screening if the pt qualifies, which is what the medicare wellness provider should know how to provide this service

## 2023-04-04 DIAGNOSIS — M6281 Muscle weakness (generalized): Secondary | ICD-10-CM | POA: Diagnosis not present

## 2023-04-04 DIAGNOSIS — M7522 Bicipital tendinitis, left shoulder: Secondary | ICD-10-CM | POA: Diagnosis not present

## 2023-04-04 DIAGNOSIS — S46012D Strain of muscle(s) and tendon(s) of the rotator cuff of left shoulder, subsequent encounter: Secondary | ICD-10-CM | POA: Diagnosis not present

## 2023-04-04 DIAGNOSIS — M25612 Stiffness of left shoulder, not elsewhere classified: Secondary | ICD-10-CM | POA: Diagnosis not present

## 2023-04-05 ENCOUNTER — Encounter: Payer: Self-pay | Admitting: Neurology

## 2023-04-05 ENCOUNTER — Telehealth: Payer: Self-pay | Admitting: Internal Medicine

## 2023-04-05 ENCOUNTER — Telehealth: Payer: Self-pay | Admitting: Neurology

## 2023-04-05 NOTE — Telephone Encounter (Signed)
Called the sister back states that for a while now he has been having Throbbing on the right side intermittently. For the last 2 days has had a increase in fatigue and last night states all of a sudden he was in the kitchen and the room started to spin. He felt a sharp pain at base of skull that moved to the front and then right side of the head. This was almost like it was going over in a circle form in same area. States that he did take ubrelvy, tizanidine and gabapentin. He also had surgery recently and has hydrocodone prescribed. Eventually the circular sharp pain went away but the throbbing on the right side stayed. At the time of this call the patient was finally napping.   Advised that not sure what we would offer for the patient her main point to advise Dr Dohmeier was that the pain in the head usually would occur on the left side and now this was occurring on the right as well as the sharp pain from base of the skull to front was a new symptoms that never had before. Informed I would pass along to Dr Vickey Huger and see what she recommends

## 2023-04-05 NOTE — Telephone Encounter (Signed)
Inbound call from patient's sister stating patient is having trouble swallowing food. States when swallowing food gets stuck in patient's chest leading to chest pain. States recommendations that were given before has not been helping. Sister is requesting a call back to discuss other options. Please advise, thank you.

## 2023-04-05 NOTE — Telephone Encounter (Signed)
Pts sister states  he is having issues with swallowing some of his pills. He has been using applesauce and is still having issues. Pt scheduled to see Willette Cluster NP 04/12/23 at 9:30am. Pts sister aware of appt. Reports pt is still taking the budesonide slurry as prescribed also.

## 2023-04-05 NOTE — Telephone Encounter (Signed)
Pt's sister called stating that they had to call after hours due to the pt having a headache that started at the base of his skull dizziness and throbbing with pain. She would like to be called back to discuss.

## 2023-04-10 ENCOUNTER — Telehealth (HOSPITAL_BASED_OUTPATIENT_CLINIC_OR_DEPARTMENT_OTHER): Payer: Self-pay | Admitting: Pulmonary Disease

## 2023-04-10 DIAGNOSIS — S46012D Strain of muscle(s) and tendon(s) of the rotator cuff of left shoulder, subsequent encounter: Secondary | ICD-10-CM | POA: Diagnosis not present

## 2023-04-10 DIAGNOSIS — Z87891 Personal history of nicotine dependence: Secondary | ICD-10-CM

## 2023-04-10 DIAGNOSIS — M6281 Muscle weakness (generalized): Secondary | ICD-10-CM | POA: Diagnosis not present

## 2023-04-10 DIAGNOSIS — M25612 Stiffness of left shoulder, not elsewhere classified: Secondary | ICD-10-CM | POA: Diagnosis not present

## 2023-04-10 DIAGNOSIS — M7522 Bicipital tendinitis, left shoulder: Secondary | ICD-10-CM | POA: Diagnosis not present

## 2023-04-10 NOTE — Telephone Encounter (Signed)
Is it ok to order low dose CT lung cancer screening?

## 2023-04-10 NOTE — Telephone Encounter (Signed)
Patients sister called states they were supposed to hear something regarding lung cancer screening program. Based on last encounter that was not mentioned but there is an order for CT scan needed since April. Please advise if patient needs referral for lung cancer screening or needs to be scheduled for CT scan only.

## 2023-04-11 NOTE — Telephone Encounter (Signed)
I still dont see an order once its put in there is nothing else you need to do

## 2023-04-11 NOTE — Telephone Encounter (Signed)
Dr. Vassie Loll, just to verify, do you want low dose CT or CT wo in April 2025?

## 2023-04-12 ENCOUNTER — Other Ambulatory Visit: Payer: Self-pay | Admitting: Neurology

## 2023-04-12 ENCOUNTER — Ambulatory Visit: Payer: 59 | Admitting: Nurse Practitioner

## 2023-04-12 MED ORDER — NURTEC 75 MG PO TBDP: 75.0000 mg | ORAL_TABLET | ORAL | 5 refills | Status: DC | PRN

## 2023-04-12 NOTE — Telephone Encounter (Signed)
Pts appt rescheduled to 04/19/23 at 2pm with Hyacinth Meeker PA, sister aware of appt.

## 2023-04-12 NOTE — Telephone Encounter (Signed)
Inbound call from patient's sister, stating patient is not feeling well today for appointment with Willette Cluster NP at 9:30 AM. States he recently had surgery and "is not up for it". She then stated she would like for Bonita Quin to speak to he in regards to a sooner appointment then January. Please advise.

## 2023-04-12 NOTE — Telephone Encounter (Signed)
Ct chest ordered

## 2023-04-16 ENCOUNTER — Ambulatory Visit: Payer: 59 | Admitting: Orthopedic Surgery

## 2023-04-16 DIAGNOSIS — G5601 Carpal tunnel syndrome, right upper limb: Secondary | ICD-10-CM | POA: Diagnosis not present

## 2023-04-16 DIAGNOSIS — G5621 Lesion of ulnar nerve, right upper limb: Secondary | ICD-10-CM | POA: Diagnosis not present

## 2023-04-16 NOTE — Progress Notes (Signed)
Casey Brennan - 62 y.o. male MRN [id number]  Date of birth: [birth year]  Office Visit Note: Visit Date: 04/16/2023 PCP: Casey Levins, MD Referred by: Casey Levins, MD  Subjective: No chief complaint on file.  HPI: Casey Brennan is a pleasant 62 y.o. male who presents today for follow-up of ongoing right hand numbness and tingling in the ring and small finger.  He is also demonstrating progressive clawing of the ring and small finger with associated atrophy of the intrinsic musculature in the adductor muscles.  He has a complex history of prior cervical decompression surgery both anterior and posterior, most recent surgery was done earlier this year with significant improvement in bilateral upper extremity symptoms.  His right-sided symptoms have remained however, particularly related to the ulnar nerve of the right upper extremity.  He recently underwent left shoulder surgery with Dr. Eulah Pont.  He is progressing well postoperatively.  Currently wearing a sling, has plans to remove the sling approximately 2 weeks per protocol and begin exercises.   Pertinent ROS were reviewed with the patient and found to be negative unless otherwise specified above in HPI.   Assessment & Plan: Visit Diagnoses:  No diagnosis found.   Plan: Extensive discussion was once again had with patient today regarding his right upper extremity.  He demonstrates significant clinical signs and symptoms consistent with severe ulnar nerve entrapment and associated atrophy.  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  However, he would benefit from nerve decompression at both the  elbow for the cubital tunnel as well as the wrist for the carpal tunnel and Guyon's canal given the significant findings for electrodiagnostic standpoint.  I explained in detail, that there could be an element of ongoing symptoms despite decompression given the underlying polyneuropathy.    Understanding this, we will plan to move forward with surgical scheduling of right cubital tunnel release, right carpal tunnel release and Guyon's canal release.  Surgery will be performed under general anesthesia.  HIV precautions will be taken.  Risks and benefits of the procedure were discussed, risks including but not limited to infection, bleeding, scarring, stiffness, nerve injury, tendon injury, vascular injury, hardware complication if utilized, recurrence of symptoms and need for subsequent operation.  Patient expressed understanding.    As far as the left shoulder, he is progressing well postoperatively.  We will plan for a date in December for the right upper extremity nerve decompression to give him further time to progress with the left shoulder.    Follow-up: No follow-ups on file.   Meds & Orders: No orders of the defined types were placed in this encounter.   No orders of the defined types were placed in this encounter.    Procedures: No procedures performed      Clinical History: MRI CERVICAL SPINE WITHOUT AND WITH CONTRAST   TECHNIQUE: Multiplanar and multiecho pulse sequences of the cervical spine, to include the craniocervical junction and cervicothoracic junction, were obtained without and with intravenous contrast.   CONTRAST:  8mL GADAVIST GADOBUTROL 1 MMOL/ML IV SOLN   COMPARISON:  Cervical spine CT 12/06/2022   Cervical spine MRI 10/10/2022   FINDINGS: Alignment: Physiologic.   Vertebrae: C3-C6  ACDF. C4-T1 posterior instrumented fusion with transpedicular screws at the C4, C5, C7 and T1 levels. The posterior fusion is new since 10/10/2022. there is no abnormal bone  marrow signal.   Cord: Normal signal and morphology.   Posterior Fossa, vertebral arteries, paraspinal tissues: Dorsal postsurgical changes at C4-T1 with a small amount of fluid in the operative bed but no specific findings of infection.   Disc levels:   C1-2: Unremarkable.   C2-3: Small disc bulge with uncovertebral hypertrophy and mild bilateral facet hypertrophy. There is no spinal canal stenosis. Unchanged mild left neural foraminal stenosis.   C3-4: ACDF. There is no spinal canal stenosis. No neural foraminal stenosis.   C4-5: ACDF and posterior fusion. There is no spinal canal stenosis. No neural foraminal stenosis.   C5-6: ACDF and posterior fusion. Bilateral uncovertebral hypertrophy. There is no spinal canal stenosis. Mild right and severe left neural foraminal stenosis.   C6-7: Intermediate sized disc osteophyte complex. There is no spinal canal stenosis. Moderate right and severe left neural foraminal stenosis.   C7-T1: Posterior fusion. There is no spinal canal stenosis. No neural foraminal stenosis.   IMPRESSION: 1. Status post C3-C6 ACDF and C4-T1 posterior instrumented fusion. No spinal canal stenosis. 2. Severe left C5-6 and C6-7 neural foraminal stenosis. 3. Moderate right C6-7 neural foraminal stenosis.     Electronically Signed   By: Deatra Robinson M.D.   On: 12/06/2022 23:25  He reports that he quit smoking about 12 years ago. His smoking use included cigars and cigarettes. He started smoking about 35 years ago. He has a 34.5 pack-year smoking history. He has never used smokeless tobacco.  Recent Labs    08/15/22 0735  HGBA1C <4.2    Objective:   Vital Signs: There were no vitals taken for this visit.  Physical Exam  Gen: Well-appearing, in no acute distress; non-toxic CV: Regular Rate. Well-perfused. Warm.  Resp: Breathing unlabored on room air; no wheezing. Psych: Fluid speech in conversation; appropriate affect; normal thought  process  Ortho Exam PHYSICAL EXAM:  General: Patient is well appearing and in no distress.  Range of Motion and Palpation Tests: Mobility is full about the elbows with flexion and extension.  No evidence of nerve subluxation bilaterally at the elbows.   Forearm supination and pronation are 85/85 bilaterally.  Wrist flexion/extension is 75/65 bilaterally.  Right hand does demonstrate notable clawing of the ring and small finger, passively correctable.  No cords or nodules are palpated.  No triggering is observed.     Neurologic, Vascular, Motor: Sensation is diminished to light touch in the right ulnar nerve distribution and median nerve distribution.  Tinel's testing positive at cubital tunnel, Guyon's canal right side.  Two point discrimination is unable to be discerned in the ulnar nerve distribution right hand.    Positive Froment's right side, significant adductor atrophy with FDI wasting Positive Phalen's, positive Durkan's compression, positive Tinel's carpal tunnel right side  Fingers pink and well perfused.  Capillary refill is brisk.      Lab Results  Component Value Date   HGBA1C <4.2 08/15/2022     Imaging: No results found.  Past Medical/Family/Surgical/Social History: Medications & Allergies reviewed per EMR, new medications updated. Patient Active Problem List   Diagnosis Date Noted   Grief 03/30/2023   Peripheral vascular disease (HCC) 03/27/2023   Left rotator cuff tear 03/13/2023   Pulmonary nodule 02/01/2023   Fever 01/28/2023   Adverse reaction to food 01/26/2023   Left shoulder pain 12/16/2022  Cervical disc disorder with radiculopathy of cervical region 11/30/2022   Malignant neoplasm metastatic to bone (HCC) 11/16/2022   Cardiomyopathy due to hypertension, without heart failure (HCC) 11/16/2022   Oropharyngeal dysphagia 10/26/2022   Bronchiectasis (HCC) 10/19/2022   Right renal mass 10/19/2022   Abnormal positron emission tomography (PET) scan  10/05/2022   Acute dysfunction of left eustachian tube 09/11/2022   Other chest pain 08/14/2022   External otitis of left ear 08/14/2022   COPD exacerbation (HCC) 06/14/2022   Precordial pain 02/08/2022   Finger pulp abscess, right 11/24/2021   Currently asymptomatic HIV infection, with history of HIV-related illness (HCC) 10/27/2021   Migraine with aura and without status migrainosus, not intractable 10/27/2021   Abnormal urine odor 08/24/2021   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   Routine screening for STI (sexually transmitted infection) 06/29/2021   Viral meningitis, unspecified 06/23/2021   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 06/22/2021   Apnea present when patient supine 06/22/2021   Action tremor 06/22/2021   AKI (acute kidney injury) (HCC) 12/29/2020   Allergy to dairy product 12/27/2020   Angioneurotic edema 12/27/2020   Mild persistent asthma, uncomplicated 12/27/2020   Allergic rhinitis 12/27/2020   Vitamin D deficiency 12/27/2020   S/P cervical spinal fusion 11/11/2020   Nevus 11/11/2020   Myelopathy (HCC) 09/29/2020   Chronic obstructive pulmonary disease with acute exacerbation (HCC) 07/13/2020   Encounter for well adult exam with abnormal findings 06/23/2020   Elevated TSH 05/20/2020   Mixed hyperlipidemia 05/12/2020   Generalized abdominal pain 07/07/2019   Peripheral neuropathy 06/02/2019   Healthcare maintenance 01/08/2019   Myopia of both eyes with astigmatism and presbyopia 09/13/2018   Tension headache 11/12/2017   Hepatitis B immune 06/06/2017   Chronic pain of left wrist 06/06/2017   Constipation 09/27/2016   Chronic renal insufficiency, stage 2 (mild) 05/22/2016   Skin lesion of right lower extremity 02/22/2016   Migraines 02/22/2016   Low back pain 02/22/2016   Osteoarthritis of right knee 01/11/2016   Kidney stones 10/25/2015   Sinusitis, chronic 05/14/2015   Cough 01/18/2015   GERD (gastroesophageal reflux disease)  07/29/2014   COPD GOLD III 07/22/2014   Hx of primary hypertension 04/27/2014   H/O prostate cancer    Lytic bone lesion of hip 08/22/2012   Cataract, nuclear 05/15/2012   Nuclear sclerotic cataract of both eyes 05/15/2012   Insomnia 05/11/2011   Essential hypertension 04/17/2011   HIV infection (HCC) 04/19/2009   Past Medical History:  Diagnosis Date   Asthma    very rare   Chronic anemia    normocytic   Chronic folliculitis    Chronic kidney disease    Stage 2   COPD (chronic obstructive pulmonary disease) (HCC)    Dyspnea    Environmental and seasonal allergies    Glaucoma    right   H/O pericarditis    2010--  myopercarditis--  resolved   Headache(784.0)    HX SEVERE FRONTAL HA'S   Hiatal hernia    History of gastric ulcer    nonbleeding gastric ulcer 58//24   History of kidney stones    History of MRSA infection 2010   infected boil   HIV (human immunodeficiency virus infection) (HCC) 1988   Hypertension    Hypertensive cardiomyopathy (HCC)    EF 37%, normal coronaries 04/2020 CCTA; LVEF 60-65% 03/2022 echo   Lytic bone lesion of hip    WORK-UP DONE BY ONCOLOGIST DR HA --  NOT MALIGNANT  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   Pneumonia    hx of   Post concussion syndrome    resolved   Prostate cancer (HCC) 04/25/2013   gleason 3+3=6, volume 30 gm   Viral meningitis    06/2021   Family History  Problem Relation Age of Onset   Stroke Father    Diabetes Father    Cancer Father        brain cancer   Asthma Father    Hypertension Sister    Cancer Maternal Uncle        prostate cancer   Asthma Sister    Colon cancer Neg Hx    Colon polyps Neg Hx    Esophageal cancer Neg Hx    Stomach cancer Neg Hx    Rectal cancer Neg Hx    Past Surgical History:  Procedure Laterality Date   ACDCF  09/29/2020   ANTERIOR CERVICAL DECOMPRESSION/DISCECTOMY FUSION 4 LEVELS N/A 09/29/2020   Procedure: ANTERIOR CERVICAL DECOMPRESSION FUSION CERVICAL 3-CERVICAL 4 ,  CERVICAL 4 - CERVICAL 5, CERVICAL 5 - CERVICAL 6 WITH INSTRUMENTATION AND ALLOGRAFT;  Surgeon: Estill Bamberg, MD;  Location: [facility] OR;  Service: Orthopedics;  Laterality: N/A;   CARDIAC CATHETERIZATION  03-23-2009  DR COOPER   NORMAL CORONARY ARTERIES   CHOLECYSTECTOMY N/A 11/06/2014   Procedure: LAPAROSCOPIC CHOLECYSTECTOMY WITH INTRAOPERATIVE CHOLANGIOGRAM;  Surgeon: Jimmye Norman, MD;  Location[name] OR;  Service: General;  Laterality: N/A;   COLONOSCOPY  12/26/2011   Procedure: COLONOSCOPY;  Surgeon: Shirley Friar, MD;  Location: [facility] ENDOSCOPY;  Service: Endoscopy;  Laterality: N/A;   COLONOSCOPY     COLONOSCOPY WITH PROPOFOL N/A 07/29/2014   Procedure: COLONOSCOPY WITH PROPOFOL;  Surgeon: Shirley Friar, MD;  Location: [facility] ENDOSCOPY;  Service: Endoscopy;  Laterality: N/A;   CYSTOSCOPY N/A 05/18/2018   Procedure: CYSTOSCOPY REMOVAL FOREIGN BODY;  Surgeon: Ihor Gully, MD;  Location: [facility] ORS;  Service: Urology;  Laterality: N/A;   CYSTOSCOPY/RETROGRADE/URETEROSCOPY Bilateral 04/25/2013   Procedure: CYSTOSCOPY/ BILATERAL RETROGRADES; BLADDER BIOPSIES;  Surgeon: Sebastian Ache, MD;  Location[facility];  Service: Urology;  Laterality: Bilateral;   DENTAL EXAMINATION UNDER ANESTHESIA     ESOPHAGOGASTRODUODENOSCOPY  12/26/2011   Procedure: ESOPHAGOGASTRODUODENOSCOPY (EGD);  Surgeon: Shirley Friar, MD;  Location: [facility]COPY;  Service: Endoscopy;  Laterality: N/A;   ESOPHAGOGASTRODUODENOSCOPY (EGD) WITH PROPOFOL N/A 07/29/2014   Procedure: ESOPHAGOGASTRODUODENOSCOPY (EGD) WITH PROPOFOL;  Surgeon: Shirley Friar, MD;  Location: [facility] ENDOSCOPY;  Service: Endoscopy;  Laterality: N/A;   EXCISION CHRONIC LEFT BREAST ABSCESS  09-21-2010   EXCISIONAL BX LEFT BREAST MASS/  I  &  D LEFT BREAST ABSCESS  03-24-2009   KNEE ARTHROSCOPY Right 1985   left axilla biopsy  04/2013   left hip biopsy  10/2012   LYMPHADENECTOMY Bilateral 08/18/2013   Procedure: LYMPHADENECTOMY "PELVIC LYMPH  NODE DISSECTION";  Surgeon: Sebastian Ache, MD;  Location: [facility] ORS;  Service: Urology;  Laterality: Bilateral;   POSTERIOR CERVICAL FUSION/FORAMINOTOMY N/A 11/30/2022   Procedure: POSTERIOR DECOMPRESSION AND FUSION CERVICAL 4- CERVICAL 5, CERVICAL 5- CERVICAL 6, CERVICAL 6- CERVICAL 7, CERVICAL 7- THORACIC 1 WITH INSTRUMENTATION AND ALLOGRAFT;  Surgeon: Estill Bamberg, MD;  Location: [facility] OR;  Service: Orthopedics;  Laterality: N/A;   PROSTATE BIOPSY N/A 04/25/2013   Procedure: BIOPSY TRANSRECTAL ULTRASONIC PROSTATE (TUBP);  Surgeon: Sebastian Ache, MD;  Location: [facility];  Service: Urology;  Laterality: N/A;   ROBOT ASSISTED LAPAROSCOPIC RADICAL PROSTATECTOMY N/A 08/18/2013   Procedure: ROBOTIC ASSISTED LAPAROSCOPIC  RADICAL PROSTATECTOMY;  Surgeon: Sebastian Ache, MD;  Location: [facility] ORS;  Service: Urology;  Laterality: N/A;   SHOULDER ARTHROSCOPY WITH ROTATOR CUFF REPAIR AND SUBACROMIAL DECOMPRESSION Left 03/13/2023   Procedure: SHOULDER ARTHROSCOPY WITH ROTATOR CUFF REPAIR AND SUBACROMIAL DECOMPRESSION, DISTAL CLAVICLECTOMY, BICEPS TENODESIS;  Surgeon: Sheral Apley, MD;  Location:  SURGERY CENTER;  Service: Orthopedics;  Laterality: Left;   TOTAL KNEE ARTHROPLASTY Right 08/01/2016   08/17/16   TOTAL KNEE ARTHROPLASTY Right 08/01/2016   Procedure: TOTAL KNEE ARTHROPLASTY;  Surgeon: Sheral Apley, MD;  Location: [facility] OR;  Service: Orthopedics;  Laterality: Right;   UPPER GASTROINTESTINAL ENDOSCOPY     UPPER LEG SOFT TISSUE BIOPSY Left 2012   lthigh   Social History   Occupational History   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 declined condoms    Mystie Ormand Fara Boros) Denese Killings, M.D. [facility] 1:40 PM

## 2023-04-18 DIAGNOSIS — M6281 Muscle weakness (generalized): Secondary | ICD-10-CM | POA: Diagnosis not present

## 2023-04-18 DIAGNOSIS — M25612 Stiffness of left shoulder, not elsewhere classified: Secondary | ICD-10-CM | POA: Diagnosis not present

## 2023-04-18 DIAGNOSIS — M7522 Bicipital tendinitis, left shoulder: Secondary | ICD-10-CM | POA: Diagnosis not present

## 2023-04-18 DIAGNOSIS — S46012D Strain of muscle(s) and tendon(s) of the rotator cuff of left shoulder, subsequent encounter: Secondary | ICD-10-CM | POA: Diagnosis not present

## 2023-04-18 MED ORDER — NURTEC 75 MG PO TBDP: 75.0000 mg | ORAL_TABLET | ORAL | Status: DC | PRN

## 2023-04-18 NOTE — Addendum Note (Signed)
Addended by: Judi Cong on: 04/18/2023 10:58 AM   Modules accepted: Orders

## 2023-04-19 ENCOUNTER — Encounter: Payer: Self-pay | Admitting: Physician Assistant

## 2023-04-19 ENCOUNTER — Ambulatory Visit: Payer: 59 | Admitting: Physician Assistant

## 2023-04-19 VITALS — BP 124/70 | HR 74 | Ht 69.0 in | Wt 219.0 lb

## 2023-04-19 DIAGNOSIS — K5900 Constipation, unspecified: Secondary | ICD-10-CM

## 2023-04-19 DIAGNOSIS — K253 Acute gastric ulcer without hemorrhage or perforation: Secondary | ICD-10-CM | POA: Diagnosis not present

## 2023-04-19 DIAGNOSIS — R1310 Dysphagia, unspecified: Secondary | ICD-10-CM

## 2023-04-19 DIAGNOSIS — K222 Esophageal obstruction: Secondary | ICD-10-CM

## 2023-04-19 DIAGNOSIS — R140 Abdominal distension (gaseous): Secondary | ICD-10-CM | POA: Diagnosis not present

## 2023-04-19 DIAGNOSIS — K200 Eosinophilic esophagitis: Secondary | ICD-10-CM

## 2023-04-19 MED ORDER — FAMOTIDINE 40 MG PO TABS: 40.0000 mg | ORAL_TABLET | Freq: Every day | ORAL | 3 refills | Status: DC

## 2023-04-19 MED ORDER — FAMOTIDINE 40 MG PO TABS: 40.0000 mg | ORAL_TABLET | Freq: Every day | ORAL | 5 refills | Status: DC

## 2023-04-19 NOTE — Addendum Note (Signed)
Addended by: Mariane Duval on: 04/19/2023 03:44 PM   Modules accepted: Orders

## 2023-04-19 NOTE — Patient Instructions (Signed)
Continue Pantoprazole 40 mg twice daily.   Start Miralax 1 capful daily in 8 ounces of liquid.  You have been scheduled for an endoscopy. Please follow written instructions given to you at your visit today.  If you use inhalers (even only as needed), please bring them with you on the day of your procedure.  If you take any of the following medications, they will need to be adjusted prior to your procedure:   DO NOT TAKE 7 DAYS PRIOR TO TEST- Trulicity (dulaglutide) Ozempic, Wegovy (semaglutide) Mounjaro (tirzepatide) Bydureon Bcise (exanatide extended release)  DO NOT TAKE 1 DAY PRIOR TO YOUR TEST Rybelsus (semaglutide) Adlyxin (lixisenatide) Victoza (liraglutide) Byetta (exanatide) ____________________________________________________________________  _______________________________________________________  If your blood pressure at your visit was 140/90 or greater, please contact your primary care physician to follow up on this.  _______________________________________________________  If you are age 52 or older, your body mass index should be between 23-30. Your Body mass index is 32.34 kg/m. If this is out of the aforementioned range listed, please consider follow up with your Primary Care Provider.  If you are age 49 or younger, your body mass index should be between 19-25. Your Body mass index is 32.34 kg/m. If this is out of the aformentioned range listed, please consider follow up with your Primary Care Provider.   ________________________________________________________  The [facility] providers would like to encourage you to use [facility] to communicate with providers for non-urgent requests or questions.  Due to long hold times on the telephone, sending your provider a message by [facility] may be a faster and more efficient way to get a response.  Please allow 48 business hours for a response.  Please remember that this is for non-urgent requests.   _______________________________________________________

## 2023-04-19 NOTE — Progress Notes (Signed)
Chief Complaint: Dysphagia  HPI:    Casey Brennan is a 62 year old African American male with past medical history as listed below including CKD, COPD and recurrent dysphagia, known to Dr. Rhea Belton, who returns to clinic today for worsening dysphagia.      11/08/2022 EGD with Dr. Leone Payor with benign-appearing esophageal stenosis and intermittent ringlike stricture dilated to 20 mm, LA grade a reflux esophagitis, nonbleeding gastric ulcers largest 2 cm and deep and erosive gastropathy.  Stains were positive for eosinophils.    01/18/2023 colonoscopy with a 2 mm polyp in transverse colon, mild diverticulosis in the sigmoid descending colon, scar in the rectum without mucosal abnormalities and small internal hemorrhoids.    01/18/2023 EGD with esophagitis healed with twice daily PPI, nonbleeding gastric ulcer.  Continued on Pantoprazole 40 twice daily.  Biopsies negative for eosinophilic esophagitis.  Repeat recommended in 3 months.    Today, patient presents to clinic accompanied by his sister who is also a patient of ours and assists with history.  Apparently he has been having increasing symptoms of dysphagia feeling like food really just does not make it all the way down to his stomach until he gets up and walks around or burps a few times.  Tells me he is also experiencing some reflux that wakes him up at night regardless of sleeping on multiple pillows.  He continues his Pantoprazole 40 mg twice a day as well as "medicine for my eosinophilic esophagitis".    Along with the above is noticed a decrease in bowel movements to maybe just twice a week now, occasionally takes a Dulcolax every couple of weeks.  Has never taken anything daily.  Associated symptoms include bloating and abdominal distention.    Denies fever, chills or weight loss.  Past Medical History:  Diagnosis Date   Asthma    very rare   Chronic anemia    normocytic   Chronic folliculitis    Chronic kidney disease    Stage 2   COPD  (chronic obstructive pulmonary disease) (HCC)    Dyspnea    Environmental and seasonal allergies    Glaucoma    right   H/O pericarditis    2010--  myopercarditis--  resolved   Headache(784.0)    HX SEVERE FRONTAL HA'S   Hiatal hernia    History of gastric ulcer    nonbleeding gastric ulcer 58//24   History of kidney stones    History of MRSA infection 2010   infected boil   HIV (human immunodeficiency virus infection) (HCC) 1988   Hypertension    Hypertensive cardiomyopathy (HCC)    EF 37%, normal coronaries 04/2020 [facility]; LVEF 60-65% 03/2022 echo   Lytic bone lesion of hip    WORK-UP DONE BY ONCOLOGIST DR HA --  NOT MALIGNANT   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   Pneumonia    hx of   Post concussion syndrome    resolved   Prostate cancer (HCC) 04/25/2013   gleason 3+3=6, volume 30 gm   Viral meningitis    06/2021    Past Surgical History:  Procedure Laterality Date   ACDCF  09/29/2020   ANTERIOR CERVICAL DECOMPRESSION/DISCECTOMY FUSION 4 LEVELS N/A 09/29/2020   Procedure: ANTERIOR CERVICAL DECOMPRESSION FUSION CERVICAL 3-CERVICAL 4 , CERVICAL 4 - CERVICAL 5, CERVICAL 5 - CERVICAL 6 WITH INSTRUMENTATION AND ALLOGRAFT;  Surgeon: Estill Bamberg, MD;  Location: [facility] OR;  Service: Orthopedics;  Laterality: N/A;   CARDIAC CATHETERIZATION  03-23-2009  DR Excell Seltzer  NORMAL CORONARY ARTERIES   CHOLECYSTECTOMY N/A 11/06/2014   Procedure: LAPAROSCOPIC CHOLECYSTECTOMY WITH INTRAOPERATIVE CHOLANGIOGRAM;  Surgeon: Jimmye Norman, MD;  Location: [facility] OR;  Service: General;  Laterality: N/A;   COLONOSCOPY  12/26/2011   Procedure: COLONOSCOPY;  Surgeon: Shirley Friar, MD;  Location: [facility];  Service: Endoscopy;  Laterality: N/A;   COLONOSCOPY     COLONOSCOPY WITH PROPOFOL N/A 07/29/2014   Procedure: COLONOSCOPY WITH PROPOFOL;  Surgeon: Shirley Friar, MD;  Location: [facility]COPY;  Service: Endoscopy;  Laterality: N/A;   CYSTOSCOPY N/A 05/18/2018   Procedure: CYSTOSCOPY REMOVAL  FOREIGN BODY;  Surgeon: Ihor Gully, MD;  Location: [facility]S;  Service: Urology;  Laterality: N/A;   CYSTOSCOPY/RETROGRADE/URETEROSCOPY Bilateral 04/25/2013   Procedure: CYSTOSCOPY/ BILATERAL RETROGRADES; BLADDER BIOPSIES;  Surgeon: Sebastian Ache, MD;  Location: [facility];  Service: Urology;  Laterality: Bilateral;   DENTAL EXAMINATION UNDER ANESTHESIA     ESOPHAGOGASTRODUODENOSCOPY  12/26/2011   Procedure: ESOPHAGOGASTRODUODENOSCOPY (EGD);  Surgeon: Shirley Friar, MD;  Location: [facility];  Service: Endoscopy;  Laterality: N/A;   ESOPHAGOGASTRODUODENOSCOPY (EGD) WITH PROPOFOL N/A 07/29/2014   Procedure: ESOPHAGOGASTRODUODENOSCOPY (EGD) WITH PROPOFOL;  Surgeon: Shirley Friar, MD;  Location: [facility] ENDOSCOPY;  Service: Endoscopy;  Laterality: N/A;   EXCISION CHRONIC LEFT BREAST ABSCESS  09-21-2010   EXCISIONAL BX LEFT BREAST MASS/  I  &  D LEFT BREAST ABSCESS  03-24-2009   KNEE ARTHROSCOPY Right 1985   left axilla biopsy  04/2013   left hip biopsy  10/2012   LYMPHADENECTOMY Bilateral 08/18/2013   Procedure: LYMPHADENECTOMY "PELVIC LYMPH NODE DISSECTION";  Surgeon: Sebastian Ache, MD;  Location: [facility];  Service: Urology;  Laterality: Bilateral;   POSTERIOR CERVICAL FUSION/FORAMINOTOMY N/A 11/30/2022   Procedure: POSTERIOR DECOMPRESSION AND FUSION CERVICAL 4- CERVICAL 5, CERVICAL 5- CERVICAL 6, CERVICAL 6- CERVICAL 7, CERVICAL 7- THORACIC 1 WITH INSTRUMENTATION AND ALLOGRAFT;  Surgeon: Estill Bamberg, MD;  Location: [facility] OR;  Service: Orthopedics;  Laterality: N/A;   PROSTATE BIOPSY N/A 04/25/2013   Procedure: BIOPSY TRANSRECTAL ULTRASONIC PROSTATE (TUBP);  Surgeon: Sebastian Ache, MD;  Location: [facility];  Service: Urology;  Laterality: N/A;   ROBOT ASSISTED LAPAROSCOPIC RADICAL PROSTATECTOMY N/A 08/18/2013   Procedure: ROBOTIC ASSISTED LAPAROSCOPIC RADICAL PROSTATECTOMY;  Surgeon: Sebastian Ache, MD;  Location: [facility];  Service: Urology;  Laterality: N/A;    SHOULDER ARTHROSCOPY WITH ROTATOR CUFF REPAIR AND SUBACROMIAL DECOMPRESSION Left 03/13/2023   Procedure: SHOULDER ARTHROSCOPY WITH ROTATOR CUFF REPAIR AND SUBACROMIAL DECOMPRESSION, DISTAL CLAVICLECTOMY, BICEPS TENODESIS;  Surgeon: Sheral Apley, MD;  Location: [facility];  Service: Orthopedics;  Laterality: Left;   TOTAL KNEE ARTHROPLASTY Right 08/01/2016   08/17/16   TOTAL KNEE ARTHROPLASTY Right 08/01/2016   Procedure: TOTAL KNEE ARTHROPLASTY;  Surgeon: Sheral Apley, MD;  Location: [facility] OR;  Service: Orthopedics;  Laterality: Right;   UPPER GASTROINTESTINAL ENDOSCOPY     UPPER LEG SOFT TISSUE BIOPSY Left 2012   lthigh    Current Outpatient Medications  Medication Sig Dispense Refill   albuterol (PROVENTIL) (2.5 MG/3ML) 0.083% nebulizer solution Take 3 mLs (2.5 mg total) by nebulization every 6 (six) hours as needed for wheezing or shortness of breath. 75 mL 3   albuterol (VENTOLIN HFA) 108 (90 Base) MCG/ACT inhaler Inhale 2 puffs into the lungs every 6 (six) hours as needed for wheezing or shortness of breath. 36 g 3   ALPRAZolam (XANAX) 0.5 MG tablet TAKE 1 TABLET(0.5 MG) BY MOUTH TWICE DAILY AS NEEDED FOR SLEEP 60  tablet 5   AMBULATORY NON FORMULARY MEDICATION Budesonide slurry 2 mg/10 ml-  Give 2 mg (10 ml) twice daily x 4 weeks; pt to swallow; npo 30 minutes after to avoid steroid washing from esophagus 560 mL 1   azelastine (ASTELIN) 0.1 % nasal spray Place 2 sprays into both nostrils in the morning. Use in each nostril as directed     BIKTARVY 50-200-25 MG TABS tablet TAKE 1 TABLET BY MOUTH DAILY 30 tablet 11   BREZTRI AEROSPHERE 160-9-4.8 MCG/ACT AERO INHALE 2 PUFFS INTO THE LUNGS IN THE MORNING AND AT BEDTIME 10.7 g 3   cetirizine (ZYRTEC) 10 MG tablet TAKE 1 TABLET(10 MG) BY MOUTH DAILY 90 tablet 3   Cholecalciferol (VITAMIN D3) 250 MCG (10000 UT) capsule Take 1 tablet by mouth daily. [employer]     cyclobenzaprine (FLEXERIL) 10 MG tablet Take 1 tablet (10 mg  total) by mouth at bedtime. 90 tablet 3   diphenhydrAMINE (BENADRYL) 25 MG tablet Take 1 tablet (25 mg total) by mouth every 6 (six) hours as needed. (Patient taking differently: Take 25 mg by mouth every 6 (six) hours as needed for sleep, itching or allergies.) 30 tablet 0   dorzolamide (TRUSOPT) 2 % ophthalmic solution Place 1 drop into both eyes 2 (two) times daily.     EPINEPHrine 0.3 mg/0.3 mL IJ SOAJ injection Inject 0.3 mg into the muscle as needed for anaphylaxis. 1 each 5   gabapentin (NEURONTIN) 300 MG capsule TAKE 1 CAPSULE(300 MG) BY MOUTH FOUR TIMES DAILY AS NEEDED FOR PAIN 120 capsule 4   Glucos-Chond-Hyal Ac-Ca Fructo (MOVE FREE JOINT HEALTH ADVANCE PO) Take 1 tablet by mouth daily.     HYDROcodone-acetaminophen (NORCO) 7.5-325 MG tablet Take 1-2 tablets by mouth every 6 (six) hours as needed for severe pain. 40 tablet 0   hydrOXYzine (ATARAX) 50 MG tablet TAKE 1 TABLET(50 MG) BY MOUTH TWICE DAILY 60 tablet 1   meclizine (ANTIVERT) 25 MG tablet Take 1 tablet (25 mg total) by mouth daily as needed. 40 tablet 5   methocarbamol (ROBAXIN) 500 MG tablet Take 500 mg by mouth 4 (four) times daily.     methocarbamol (ROBAXIN) 500 MG tablet Take 1-2 tablets (500-1,000 mg total) by mouth every 6 (six) hours as needed for muscle spasms. 30 tablet 2   metoprolol succinate (TOPROL-XL) 100 MG 24 hr tablet TAKE 1 TABLET(100 MG) BY MOUTH DAILY WITH OR IMMEDIATELY FOLLOWING A MEAL 30 tablet 0   montelukast (SINGULAIR) 10 MG tablet Take 10 mg by mouth daily.     Multiple Vitamin (MULTIVITAMIN WITH MINERALS) TABS tablet Take 1 tablet by mouth daily after breakfast.      mupirocin ointment (BACTROBAN) 2 % APPLY EXTERNALLY TO THE AFFECTED AREA TWICE DAILY FOR 3 WEEKS, PLACE COTTON BETWEEN TOES TO ALLOW BETTER AERATION AS NEEDED (Patient taking differently: Apply 1 application  topically daily as needed (affected toes).) 66 g 1   ondansetron (ZOFRAN-ODT) 4 MG disintegrating tablet Take 1 tablet (4 mg total)  by mouth every 8 (eight) hours as needed for nausea or vomiting. (Patient taking differently: Take 4 mg by mouth every 4 (four) hours as needed for nausea or vomiting.) 30 tablet 2   OVER THE COUNTER MEDICATION Take 2 capsules by mouth daily. Zhou: thyroid supplement     pantoprazole (PROTONIX) 40 MG tablet Take 1 tablet (40 mg total) by mouth 2 (two) times daily before a meal. 180 tablet 3   QNASL 80 MCG/ACT AERS USE 2 SPRAYS  IN EACH NOSTRIL DAILY (Patient taking differently: Place 2 sprays into both nostrils in the morning and at bedtime.) 10.6 g 2   QULIPTA 30 MG TABS Take 1 tablet (30 mg total) by mouth daily. 30 tablet 5   Rimegepant Sulfate (NURTEC) 75 MG TBDP Take 1 tablet (75 mg total) by mouth as needed (Take at onset of migraine (do not exceed more than 1 tablet in 24 hours)). 10 tablet 5   Rimegepant Sulfate (NURTEC) 75 MG TBDP Take 1 tablet (75 mg total) by mouth as needed (take at onset of migraine (do NOT take more than 1 tablet in 24 hour time)).     rosuvastatin (CRESTOR) 20 MG tablet TAKE 1 TABLET(20 MG) BY MOUTH DAILY (Patient taking differently: Take 20 mg by mouth daily.) 90 tablet 3   tizanidine (ZANAFLEX) 6 MG capsule Take 6 mg by mouth 3 (three) times daily.     torsemide (DEMADEX) 20 MG tablet Take 10 mg by mouth See admin instructions. Tuesday Thursday Saturday     Ubrogepant (UBRELVY) 100 MG TABS Take 1 tablet (100 mg total) by mouth as needed (take 1 tablet at onset of migraine. may take an additional tablet 2 hrs later if needed (Do not exceed more than 2 tablets in 24 hour period)). 16 tablet 5   nitroGLYCERIN (NITROSTAT) 0.4 MG SL tablet Place 1 tablet (0.4 mg total) under the tongue every 5 (five) minutes as needed for chest pain. 30 tablet 1   No current facility-administered medications for this visit.    Allergies as of 04/19/2023 - Review Complete 04/19/2023  Allergen Reaction Noted   Amoxapine Shortness Of Breath 03/06/2018   Chocolate Shortness Of Breath and  Other (See Comments) 05/22/2016   Ciprofloxacin Anaphylaxis, Dermatitis, Itching, Nausea And Vomiting, Shortness Of Breath, Swelling, and Hives 09/13/2021   Descovy [emtricitabine-tenofovir af] Shortness Of Breath 08/31/2017   Doxycycline hyclate Shortness Of Breath and Swelling 08/04/2022   Genvoya [elviteg-cobic-emtricit-tenofaf] Shortness Of Breath 08/31/2017   Morphine and codeine Shortness Of Breath, Itching, and Other (See Comments) 06/13/2013   Other Anaphylaxis 06/09/2022   Oxycodone-acetaminophen Shortness Of Breath 11/11/2020   Penicillins Shortness Of Breath and Rash    Sulfa antibiotics Anaphylaxis and Other (See Comments) 10/17/2015   Tomato Shortness Of Breath 05/22/2016   Vancomycin Shortness Of Breath and Other (See Comments) 09/25/2016   Darunavir Itching 04/03/2021   Amlodipine Other (See Comments) 11/24/2021   Carvedilol  04/08/2020   Flagyl [metronidazole] Itching 09/25/2016   Meloxicam  11/22/2022   Milk-related compounds  05/18/2019   Morphine Other (See Comments) 11/16/2020   Oxycodone hcl Other (See Comments) 10/17/2022   Penicillin g Other (See Comments) 11/16/2020   Toradol [ketorolac tromethamine] Nausea And Vomiting 11/12/2017   Acetaminophen Rash and Other (See Comments) 11/11/2014   Tramadol Itching, Nausea And Vomiting, and Other (See Comments) 07/28/2014    Family History  Problem Relation Age of Onset   Leukemia Mother    Stroke Father    Diabetes Father    Cancer Father        brain cancer   Asthma Father    Hypertension Sister    Asthma Sister    Prostate cancer Maternal Uncle    Colon cancer Neg Hx    Colon polyps Neg Hx    Esophageal cancer Neg Hx    Stomach cancer Neg Hx    Rectal cancer Neg Hx     Social History   Socioeconomic History   Marital status: Divorced  Spouse name: Not on file   Number of children: 1   Years of education: College   Highest education level: Bachelor's degree (e.g., BA, AB, BS)  Occupational History    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 declined condoms  Other Topics Concern   Not on file  Social History Narrative   Sister April stays with him currently although he hopes to return to truck driving. 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 Sister is starting medical school next year.   Patient is divorced and lives with his sister.   Patient has one adult son.   Patient is part-time, driving a truck.   Patient has a college education.   Patient is right-handed.   Patient drinks 2-3 cups of coffee and a 4-5 cups of tea.   Social Determinants of Health   Financial Resource Strain: Medium Risk (03/27/2023)   Overall Financial Resource Strain (CARDIA)    Difficulty of Paying Living Expenses: Somewhat hard  Food Insecurity: Food Insecurity Present (03/27/2023)   Hunger Vital Sign    Worried About Running Out of Food in the Last Year: Sometimes true    Ran Out of Food in the Last Year: Sometimes true  Transportation Needs: No Transportation Needs (03/27/2023)   PRAPARE - Administrator, Civil Service (Medical): No    Lack of Transportation (Non-Medical): No  Physical Activity: Insufficiently Active (03/27/2023)   Exercise Vital Sign    Days of Exercise per Week: 2 days    Minutes of Exercise per Session: 10 min  Stress: No Stress Concern Present (03/27/2023)    [employer] of Occupational Health - Occupational Stress Questionnaire    Feeling of Stress : Not at all  Social Connections: Moderately Integrated (03/27/2023)   Social Connection and Isolation Panel [NHANES]    Frequency of Communication with Friends and Family: More than three times a week    Frequency of Social Gatherings with Friends and Family: Twice a week    Attends Religious Services: More than 4 times per year    Active Member of [employer] or Organizations: Yes    Attends Engineer, structural: More than 4 times per year    Marital Status: Divorced  Intimate Partner Violence: Not At Risk (03/30/2023)   Humiliation, Afraid, Rape, and Kick questionnaire    Fear of Current or Ex-Partner: No    Emotionally Abused: No    Physically Abused: No    Sexually Abused: No    Review of Systems:    Constitutional: No weight loss, fever or chills Cardiovascular: No chest pain Respiratory: No SOB Gastrointestinal: See HPI and otherwise negative   Physical Exam:  Vital signs: BP 124/70   Pulse 74   Ht 5'  9" (1.753 m)   Wt 219 lb (99.3 kg)   BMI 32.34 kg/m   Constitutional:   Pleasant AA male appears to be in NAD, Well developed, Well nourished, alert and cooperative Respiratory: Respirations even and unlabored. Lungs clear to auscultation bilaterally.   No wheezes, crackles, or rhonchi.  Cardiovascular: Normal S1, S2. No MRG. Regular rate and rhythm. No peripheral edema, cyanosis or pallor.  Gastrointestinal:  Soft, mild distension, mild epigastric TTP. No rebound or guarding.  Decreased bowel sounds all 4 quadrants. No appreciable masses or hepatomegaly. Rectal:  Not performed.  Psychiatric: Demonstrates good judgement and reason without abnormal affect or behaviors.  RELEVANT LABS AND IMAGING: CBC    Component Value Date/Time   WBC 5.8 12/06/2022 1825   RBC 4.23 12/06/2022 1825   HGB 12.6 (L) 12/06/2022 1825   HGB 13.6 01/25/2022 1611   HGB 14.0 05/11/2015 1108    HCT 38.0 (L) 12/06/2022 1825   HCT 40.1 01/25/2022 1611   HCT 42.1 05/11/2015 1108   PLT 292 12/06/2022 1825   PLT 167 01/25/2022 1611   MCV 89.8 12/06/2022 1825   MCV 92 01/25/2022 1611   MCV 97.5 05/11/2015 1108   MCH 29.8 12/06/2022 1825   MCHC 33.2 12/06/2022 1825   RDW 13.1 12/06/2022 1825   RDW 13.8 01/25/2022 1611   RDW 12.9 05/11/2015 1108   LYMPHSABS 3.2 10/17/2022 1555   LYMPHSABS 1.5 05/11/2015 1108   MONOABS 0.8 10/17/2022 1555   MONOABS 0.3 05/11/2015 1108   EOSABS 0.5 10/17/2022 1555   EOSABS 0.6 (H) 05/11/2015 1108   BASOSABS 0.1 10/17/2022 1555   BASOSABS 0.0 05/11/2015 1108    CMP     Component Value Date/Time   NA 135 03/12/2023 1029   NA 143 01/25/2022 1611   NA 142 02/24/2013 0848   K 3.6 03/12/2023 1029   K 3.4 (L) 02/24/2013 0848   CL 105 03/12/2023 1029   CO2 25 03/12/2023 1029   CO2 24 02/24/2013 0848   GLUCOSE 96 03/12/2023 1029   GLUCOSE 121 02/24/2013 0848   GLUCOSE 93 03/25/2009 0000   BUN 7 03/12/2023 1029   BUN 8 01/25/2022 1611   BUN 11.9 02/24/2013 0848   CREATININE 1.17 03/12/2023 1029   CREATININE 1.24 11/11/2020 1526   CREATININE 1.3 02/24/2013 0848   CALCIUM 8.5 (L) 03/12/2023 1029   CALCIUM 8.9 02/24/2013 0848   PROT 7.1 12/06/2022 1825   PROT 7.0 01/25/2022 1611   PROT 7.1 02/24/2013 0848   ALBUMIN 2.9 (L) 12/06/2022 1825   ALBUMIN 4.3 01/25/2022 1611   ALBUMIN 3.6 02/24/2013 0848   AST 40 12/06/2022 1825   AST 14 02/24/2013 0848   ALT 53 (H) 12/06/2022 1825   ALT 11 02/24/2013 0848   ALKPHOS 69 12/06/2022 1825   ALKPHOS 94 02/24/2013 0848   BILITOT 1.1 12/06/2022 1825   BILITOT 0.5 01/25/2022 1611   BILITOT 1.40 (H) 02/24/2013 0848   GFRNONAA >60 03/12/2023 1029   GFRNONAA 70 12/04/2018 1543   GFRAA >60 05/08/2019 0504   GFRAA 82 12/04/2018 1543    Assessment: 1.  GERD and dysphagia: Currently on Pantoprazole 40 mg twice daily, has had multiple endoscopies, 1 in May and another in July, initially diagnosed with  eosinophilic esophagitis and reflux with continued gastric ulcers, repeat EGD already scheduled for the end of November, patient has increase in reflux symptoms as well as dysphagia over the past month or so, has had anterior cervical neck fusion procedure as well; consider  ongoing eosinophilic esophagitis +/- stricture +/- extrinsic compression  Plan: 1.  Looked at Dr. Sherald Barge schedule.  The only earlier date he has for an EGD is on 11/20.  Will send him a message to see if the patient can be added to cancellation list prior to that for earlier EGD.  Patient does not wish to be scheduled with a different provider.  Otherwise we will keep his appointment on 11/22. 2.  Continue Pantoprazole 40 mg twice daily 3.  Will go ahead and add Pepcid 40 mg nightly 4.  Patient to follow in clinic per recommendations after time procedure.  Hyacinth Meeker, PA-C [facility] 04/19/2023, 2:09 PM  Cc: Corwin Levins, MD

## 2023-04-20 DIAGNOSIS — S46012D Strain of muscle(s) and tendon(s) of the rotator cuff of left shoulder, subsequent encounter: Secondary | ICD-10-CM | POA: Diagnosis not present

## 2023-04-20 DIAGNOSIS — M7522 Bicipital tendinitis, left shoulder: Secondary | ICD-10-CM | POA: Diagnosis not present

## 2023-04-20 DIAGNOSIS — M25612 Stiffness of left shoulder, not elsewhere classified: Secondary | ICD-10-CM | POA: Diagnosis not present

## 2023-04-20 DIAGNOSIS — M6281 Muscle weakness (generalized): Secondary | ICD-10-CM | POA: Diagnosis not present

## 2023-04-23 ENCOUNTER — Telehealth: Payer: Self-pay | Admitting: Neurology

## 2023-04-23 DIAGNOSIS — S46012D Strain of muscle(s) and tendon(s) of the rotator cuff of left shoulder, subsequent encounter: Secondary | ICD-10-CM | POA: Diagnosis not present

## 2023-04-23 DIAGNOSIS — M6281 Muscle weakness (generalized): Secondary | ICD-10-CM | POA: Diagnosis not present

## 2023-04-23 DIAGNOSIS — M7522 Bicipital tendinitis, left shoulder: Secondary | ICD-10-CM | POA: Diagnosis not present

## 2023-04-23 DIAGNOSIS — M25612 Stiffness of left shoulder, not elsewhere classified: Secondary | ICD-10-CM | POA: Diagnosis not present

## 2023-04-23 NOTE — Telephone Encounter (Signed)
Chart reviewed, patient was referred by Dr. Vickey Huger for EMG nerve conduction study but canceled multiple previous scheduled visit on February 21, 2027, 30, now is on schedule for April 25, 2023  Please call patient, if not able to confirm appointment, we will cancel him from schedule.  Also inform him the responsibility to pay for co-pay if canceled within short notice

## 2023-04-23 NOTE — Telephone Encounter (Signed)
I called and spoke with the patient's sister and caregiver, April (per [facility]). She confirmed the patient would be coming to the appointment. I informed her that the copay will become his responsibility if he no shows or cancels within 48 hours of his appointment time. She verbalized understanding.

## 2023-04-24 DIAGNOSIS — M4692 Unspecified inflammatory spondylopathy, cervical region: Secondary | ICD-10-CM | POA: Diagnosis not present

## 2023-04-24 DIAGNOSIS — M542 Cervicalgia: Secondary | ICD-10-CM | POA: Diagnosis not present

## 2023-04-25 ENCOUNTER — Telehealth: Payer: Self-pay

## 2023-04-25 ENCOUNTER — Encounter: Payer: 59 | Admitting: Neurology

## 2023-04-25 ENCOUNTER — Ambulatory Visit (INDEPENDENT_AMBULATORY_CARE_PROVIDER_SITE_OTHER): Payer: 59 | Admitting: Neurology

## 2023-04-25 ENCOUNTER — Ambulatory Visit (INDEPENDENT_AMBULATORY_CARE_PROVIDER_SITE_OTHER): Payer: Self-pay | Admitting: Neurology

## 2023-04-25 DIAGNOSIS — A879 Viral meningitis, unspecified: Secondary | ICD-10-CM

## 2023-04-25 DIAGNOSIS — I119 Hypertensive heart disease without heart failure: Secondary | ICD-10-CM

## 2023-04-25 DIAGNOSIS — B20 Human immunodeficiency virus [HIV] disease: Secondary | ICD-10-CM

## 2023-04-25 DIAGNOSIS — R202 Paresthesia of skin: Secondary | ICD-10-CM | POA: Diagnosis not present

## 2023-04-25 DIAGNOSIS — R29898 Other symptoms and signs involving the musculoskeletal system: Secondary | ICD-10-CM | POA: Insufficient documentation

## 2023-04-25 DIAGNOSIS — M4722 Other spondylosis with radiculopathy, cervical region: Secondary | ICD-10-CM

## 2023-04-25 DIAGNOSIS — G4459 Other complicated headache syndrome: Secondary | ICD-10-CM

## 2023-04-25 DIAGNOSIS — Z0289 Encounter for other administrative examinations: Secondary | ICD-10-CM

## 2023-04-25 DIAGNOSIS — L97511 Non-pressure chronic ulcer of other part of right foot limited to breakdown of skin: Secondary | ICD-10-CM

## 2023-04-25 NOTE — Telephone Encounter (Signed)
RTC to patient's sister.  Message left that the Clinics had returned her call.  Will need to check with Casey Brennan to see if the patient will qualify to come to the Clonics for the injections. see if patient's insurance will

## 2023-04-25 NOTE — Progress Notes (Signed)
ASSESSMENT AND PLAN  Casey Brennan is a 62 y.o. male   Bilateral upper and lower extremity cold sensation,  Reported long history since his first bite as a child,  Right hand weakness, numbness following right nipple area abscess, pending right ulnar and median decompression surgery by orthopedic surgeon,  There was no length dependent sensory changes on examination today, well-preserved bilateral lower extremity deep tendon reflexes, only slight decreased snap amplitude at right lower extremity sensory responses, left lower extremity sensory responses were obscured by his frequent muscle artifact, he did not want to have more repeat testing. The evaluation was aborted earlier.  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  The slight abnormal nerve conduction study, could also be explained by artifact.     DIAGNOSTIC DATA (LABS, IMAGING, TESTING) - I reviewed patient records, labs, notes, testing and imaging myself where available.   MEDICAL HISTORY:  Casey Brennan is a 61 year right handed, seen in request by Dr. Vickey Huger for electrodiagnostic study, his primary care physician is Dr. Oliver Barre, he is accompanied by his sister who also lives with him at today's visit on April 25, 2023  History is obtained from the patient and review of electronic medical records. I personally reviewed pertinent available imaging films in PACS.   PMHx of  Anxiety, chronic insomnia HTN HLD Chronic migraine History of gastric ulcer COPD,quit smoking in 2011 HIV since 1988 History of kidney stone Prostate cancer, s/p proctectomy in Feb 2015 Hx of virus meningitis in 06/2021 Cervical  decompression twice, March 22, then Nov 30 2022,   Left shoulder rotator cuff arthroscopic repair in September 2024  Ever since he suffered frostbite as a young child, he often feels bilateral feet and finger cold, turning  to purple sometimes, but denies significant sensory loss  He was diagnosed with HIV since 1988, under excellent control, in 2012, he suffered severe sepsis due to right nipple area infection, turned into gangrene, bacteremia, severe sepsis  Since then, his bilateral upper and lower extremity cold sensation getting worse, he also developed right hand weakness, numbness, right hand muscle significant atrophy  He fell down from a tree in 2013, broke his neck, had a cervical surgery twice, most recent 1 was on Nov 30, 2022 by orthopedic surgeon Dr. 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 Alvester Morin reviewed, right median sensory response showed mildly prolonged peak latency with well-preserved snap amplitude, right radial sensory responses were normal.  Right ulnar sensory responses showed significantly prolonged peak latency with significantly decreased snap amplitude.  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  Right ulnar motor responses showed significantly prolonged distal latency, severely decreased CMAP amplitude, slow conduction velocity, there was reported 15 m/s velocity drop across right elbow, but it is hard to evaluate in the setting of severe's decreased CMAP amplitude  Orthopedic  surgeon Dr. Nydia Bouton has offered decompression  of right median and ulnar nerve at cubital tunnel and to do wrist Guyon's canal, on schedule for surgical decompression in December 2024  He is referred for further testing worried about underlying polyneuropathy  EMG nerve conduction study April 25, 2023 at the [name] showed slightly decreased the right sural, superficial peroneal snap amplitude, during stimulation of left lower extremity sensory nerve study, he has muscle jerking, obscure the data, he does not want to continue more extensive study.  Bilateral tibial, peroneal motor responses were normal, selected needle examination of left lower extremity muscles were normal.    PHYSICAL EXAM:      04/25/2023    9:20 AM 04/19/2023    2:06 PM 03/30/2023    2:29 PM  Vitals with BMI  Height 5\' 9"  5\' 9"  5\' 9"   Weight 205 lbs 219 lbs 217 lbs  BMI 30.26 32.33 32.03  Systolic  124   Diastolic  70   Pulse  74      PHYSICAL EXAMNIATION:  Gen: NAD, conversant, well nourised, well groomed                     Cardiovascular: Regular rate rhythm, no peripheral edema, warm, nontender. Eyes: Conjunctivae clear without exudates or hemorrhage Neck: Supple, no carotid bruits.   NEUROLOGICAL EXAM:  MENTAL STATUS: Speech/cognition: Awake, alert, oriented to history taking and casual conversation CRANIAL NERVES: CN II: Visual fields are full to confrontation. Pupils are round equal and briskly reactive to light. CN III, IV, VI: extraocular movement are normal. No ptosis. CN V: Facial sensation is intact to light touch CN VII: Face is symmetric with normal eye closure  CN VIII: Hearing is normal to causal conversation. CN IX, X: Phonation is normal. CN XI: Head turning and shoulder shrug are intact  MOTOR: Significant right hand intrinsic muscle atrophy, well-preserved right abductor pollicis brevis, right hand weakness mainly involving right ulnar distribution, significant right finger abduction weakness, right fourth and fifth finger  flexion,  REFLEXES: Reflexes are 2+ and symmetric at the biceps, triceps, knees, and trace at ankles. Plantar responses are flexor.  SENSORY: Intact to light touch, pinprick and vibratory sensation are intact in fingers and toes.  With exception of numbness of right fourth and fifth fingers, also involving right ulnar palm and dorsum hand.  COORDINATION: There is no trunk or limb dysmetria noted.  GAIT/STANCE: Posture is normal. Gait is steady   REVIEW OF SYSTEMS:  Full 14 system review of systems performed and notable only for as above All other review of systems were negative.   ALLERGIES: Allergies  Allergen Reactions   Amoxapine Shortness Of Breath   Chocolate Shortness Of Breath and Other (See Comments)    Asthma attack, welts   Ciprofloxacin Anaphylaxis, Dermatitis, Itching, Nausea And Vomiting, Shortness Of Breath, Swelling and Hives   Descovy [Emtricitabine-Tenofovir Af] Shortness Of Breath    Nasuea, vomiiting, diarrhea, sob, whelps, throat closes   Doxycycline Hyclate Shortness Of Breath and Swelling   Genvoya [Elviteg-Cobic-Emtricit-Tenofaf] Shortness Of Breath    Nausea, vomitting, diarreha, sob, rash, throat closing   Morphine And Codeine Shortness Of Breath, Itching and Other (See Comments)     chest pain   Other Anaphylaxis   Oxycodone-Acetaminophen Shortness Of Breath   Penicillins Shortness Of Breath and Rash    Has patient had a PCN reaction causing immediate rash, facial/tongue/throat swelling, SOB or lightheadedness with hypotension: Yes Has patient had a PCN reaction causing  severe rash involving mucus membranes or skin necrosis: No Has patient had a PCN reaction that required hospitalization No Has patient had a PCN reaction occurring within the last 10 years: Yes If all of the above answers are "NO", then may proceed with Cephalosporin use.   Sulfa Antibiotics Anaphylaxis and Other (See Comments)   Tomato Shortness Of Breath   Vancomycin Shortness Of  Breath and Other (See Comments)   Darunavir Itching   Amlodipine Other (See Comments)    swelling   Carvedilol     LEG SWELLING, DIZZNESS   Flagyl [Metronidazole] Itching   Meloxicam    Milk-Related Compounds    Morphine Other (See Comments)   Oxycodone Hcl Other (See Comments)   Penicillin G Other (See Comments)   Toradol [Ketorolac Tromethamine] Nausea And Vomiting    itching   Acetaminophen Rash and Other (See Comments)    Pt tolerates percocet and also plain APAP without rash   Tramadol Itching, Nausea And Vomiting and Other (See Comments)    HOME MEDICATIONS: Current Outpatient Medications  Medication Sig Dispense Refill   albuterol (PROVENTIL) (2.5 MG/3ML) 0.083% nebulizer solution Take 3 mLs (2.5 mg total) by nebulization every 6 (six) hours as needed for wheezing or shortness of breath. 75 mL 3   albuterol (VENTOLIN HFA) 108 (90 Base) MCG/ACT inhaler Inhale 2 puffs into the lungs every 6 (six) hours as needed for wheezing or shortness of breath. 36 g 3   ALPRAZolam (XANAX) 0.5 MG tablet TAKE 1 TABLET(0.5 MG) BY MOUTH TWICE DAILY AS NEEDED FOR SLEEP 60 tablet 5   AMBULATORY NON FORMULARY MEDICATION Budesonide slurry 2 mg/10 ml-  Give 2 mg (10 ml) twice daily x 4 weeks; pt to swallow; npo 30 minutes after to avoid steroid washing from esophagus 560 mL 1   azelastine (ASTELIN) 0.1 % nasal spray Place 2 sprays into both nostrils in the morning. Use in each nostril as directed     BIKTARVY 50-200-25 MG TABS tablet TAKE 1 TABLET BY MOUTH DAILY 30 tablet 11   BREZTRI AEROSPHERE 160-9-4.8 MCG/ACT AERO INHALE 2 PUFFS INTO THE LUNGS IN THE MORNING AND AT BEDTIME 10.7 g 3   cetirizine (ZYRTEC) 10 MG tablet TAKE 1 TABLET(10 MG) BY MOUTH DAILY 90 tablet 3   Cholecalciferol (VITAMIN D3) 250 MCG (10000 UT) capsule Take 1 tablet by mouth daily. [employer]'s Made     cyclobenzaprine (FLEXERIL) 10 MG tablet Take 1 tablet (10 mg total) by mouth at bedtime. 90 tablet 3   diphenhydrAMINE (BENADRYL)  25 MG tablet Take 1 tablet (25 mg total) by mouth every 6 (six) hours as needed. (Patient taking differently: Take 25 mg by mouth every 6 (six) hours as needed for sleep, itching or allergies.) 30 tablet 0   dorzolamide (TRUSOPT) 2 % ophthalmic solution Place 1 drop into both eyes 2 (two) times daily.     EPINEPHrine 0.3 mg/0.3 mL IJ SOAJ injection Inject 0.3 mg into the muscle as needed for anaphylaxis. 1 each 5   famotidine (PEPCID) 40 MG tablet Take 1 tablet (40 mg total) by mouth at bedtime. 90 tablet 3   gabapentin (NEURONTIN) 300 MG capsule TAKE 1 CAPSULE(300 MG) BY MOUTH FOUR TIMES DAILY AS NEEDED FOR PAIN 120 capsule 4   Glucos-Chond-Hyal Ac-Ca Fructo (MOVE FREE JOINT HEALTH ADVANCE PO) Take 1 tablet by mouth daily.     HYDROcodone-acetaminophen (NORCO) 7.5-325 MG tablet Take 1-2 tablets by mouth every 6 (six) hours as needed for severe pain. 40 tablet  0   hydrOXYzine (ATARAX) 50 MG tablet TAKE 1 TABLET(50 MG) BY MOUTH TWICE DAILY 60 tablet 1   meclizine (ANTIVERT) 25 MG tablet Take 1 tablet (25 mg total) by mouth daily as needed. 40 tablet 5   methocarbamol (ROBAXIN) 500 MG tablet Take 500 mg by mouth 4 (four) times daily.     methocarbamol (ROBAXIN) 500 MG tablet Take 1-2 tablets (500-1,000 mg total) by mouth every 6 (six) hours as needed for muscle spasms. 30 tablet 2   metoprolol succinate (TOPROL-XL) 100 MG 24 hr tablet TAKE 1 TABLET(100 MG) BY MOUTH DAILY WITH OR IMMEDIATELY FOLLOWING A MEAL 30 tablet 0   montelukast (SINGULAIR) 10 MG tablet Take 10 mg by mouth daily.     Multiple Vitamin (MULTIVITAMIN WITH MINERALS) TABS tablet Take 1 tablet by mouth daily after breakfast.      mupirocin ointment (BACTROBAN) 2 % APPLY EXTERNALLY TO THE AFFECTED AREA TWICE DAILY FOR 3 WEEKS, PLACE COTTON BETWEEN TOES TO ALLOW BETTER AERATION AS NEEDED (Patient taking differently: Apply 1 application  topically daily as needed (affected toes).) 66 g 1   nitroGLYCERIN (NITROSTAT) 0.4 MG SL tablet Place 1  tablet (0.4 mg total) under the tongue every 5 (five) minutes as needed for chest pain. 30 tablet 1   ondansetron (ZOFRAN-ODT) 4 MG disintegrating tablet Take 1 tablet (4 mg total) by mouth every 8 (eight) hours as needed for nausea or vomiting. (Patient taking differently: Take 4 mg by mouth every 4 (four) hours as needed for nausea or vomiting.) 30 tablet 2   OVER THE COUNTER MEDICATION Take 2 capsules by mouth daily. Zhou: thyroid supplement     pantoprazole (PROTONIX) 40 MG tablet Take 1 tablet (40 mg total) by mouth 2 (two) times daily before a meal. 180 tablet 3   QNASL 80 MCG/ACT AERS USE 2 SPRAYS IN EACH NOSTRIL DAILY (Patient taking differently: Place 2 sprays into both nostrils in the morning and at bedtime.) 10.6 g 2   QULIPTA 30 MG TABS Take 1 tablet (30 mg total) by mouth daily. 30 tablet 5   Rimegepant Sulfate (NURTEC) 75 MG TBDP Take 1 tablet (75 mg total) by mouth as needed (Take at onset of migraine (do not exceed more than 1 tablet in 24 hours)). 10 tablet 5   Rimegepant Sulfate (NURTEC) 75 MG TBDP Take 1 tablet (75 mg total) by mouth as needed (take at onset of migraine (do NOT take more than 1 tablet in 24 hour time)).     rosuvastatin (CRESTOR) 20 MG tablet TAKE 1 TABLET(20 MG) BY MOUTH DAILY (Patient taking differently: Take 20 mg by mouth daily.) 90 tablet 3   tizanidine (ZANAFLEX) 6 MG capsule Take 6 mg by mouth 3 (three) times daily.     torsemide (DEMADEX) 20 MG tablet Take 10 mg by mouth See admin instructions. Tuesday Thursday Saturday     Ubrogepant (UBRELVY) 100 MG TABS Take 1 tablet (100 mg total) by mouth as needed (take 1 tablet at onset of migraine. may take an additional tablet 2 hrs later if needed (Do not exceed more than 2 tablets in 24 hour period)). 16 tablet 5   No current facility-administered medications for this visit.    PAST MEDICAL HISTORY: Past Medical History:  Diagnosis Date   Asthma    very rare   Chronic anemia    normocytic   Chronic  folliculitis    Chronic kidney disease    Stage 2   COPD (chronic obstructive  pulmonary disease) (HCC)    Dyspnea    Environmental and seasonal allergies    Glaucoma    right   H/O pericarditis    2010--  myopercarditis--  resolved   Headache(784.0)    HX SEVERE FRONTAL HA'S   Hiatal hernia    History of gastric ulcer    nonbleeding gastric ulcer 58//24   History of kidney stones    History of MRSA infection 2010   infected boil   HIV (human immunodeficiency virus infection) (HCC) 1988   Hypertension    Hypertensive cardiomyopathy (HCC)    EF 37%, normal coronaries 04/2020 [facility]; LVEF 60-65% 03/2022 echo   Lytic bone lesion of hip    WORK-UP DONE BY ONCOLOGIST DR HA --  NOT MALIGNANT   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   Pneumonia    hx of   Post concussion syndrome    resolved   Prostate cancer (HCC) 04/25/2013   gleason 3+3=6, volume 30 gm   Viral meningitis    06/2021    PAST SURGICAL HISTORY: Past Surgical History:  Procedure Laterality Date   ACDCF  09/29/2020   ANTERIOR CERVICAL DECOMPRESSION/DISCECTOMY FUSION 4 LEVELS N/A 09/29/2020   Procedure: ANTERIOR CERVICAL DECOMPRESSION FUSION CERVICAL 3-CERVICAL 4 , CERVICAL 4 - CERVICAL 5, CERVICAL 5 - CERVICAL 6 WITH INSTRUMENTATION AND ALLOGRAFT;  Surgeon: Estill Bamberg, MD;  Location: [facility] OR;  Service: Orthopedics;  Laterality: N/A;   CARDIAC CATHETERIZATION  03-23-2009  DR COOPER   NORMAL CORONARY ARTERIES   CHOLECYSTECTOMY N/A 11/06/2014   Procedure: LAPAROSCOPIC CHOLECYSTECTOMY WITH INTRAOPERATIVE CHOLANGIOGRAM;  Surgeon: Jimmye Norman, MD;  Location: [facility] OR;  Service: General;  Laterality: N/A;   COLONOSCOPY  12/26/2011   Procedure: COLONOSCOPY;  Surgeon: Shirley Friar, MD;  Location: [facility] ENDOSCOPY;  Service: Endoscopy;  Laterality: N/A;   COLONOSCOPY     COLONOSCOPY WITH PROPOFOL N/A 07/29/2014   Procedure: COLONOSCOPY WITH PROPOFOL;  Surgeon: Shirley Friar, MD;  Location: [facility] ENDOSCOPY;  Service:  Endoscopy;  Laterality: N/A;   CYSTOSCOPY N/A 05/18/2018   Procedure: CYSTOSCOPY REMOVAL FOREIGN BODY;  Surgeon: Ihor Gully, MD;  Location: [facility] ORS;  Service: Urology;  Laterality: N/A;   CYSTOSCOPY/RETROGRADE/URETEROSCOPY Bilateral 04/25/2013   Procedure: CYSTOSCOPY/ BILATERAL RETROGRADES; BLADDER BIOPSIES;  Surgeon: Sebastian Ache, MD;  Location: [facility];  Service: Urology;  Laterality: Bilateral;   DENTAL EXAMINATION UNDER ANESTHESIA     ESOPHAGOGASTRODUODENOSCOPY  12/26/2011   Procedure: ESOPHAGOGASTRODUODENOSCOPY (EGD);  Surgeon: Shirley Friar, MD;  Location: [facility];  Service: Endoscopy;  Laterality: N/A;   ESOPHAGOGASTRODUODENOSCOPY (EGD) WITH PROPOFOL N/A 07/29/2014   Procedure: ESOPHAGOGASTRODUODENOSCOPY (EGD) WITH PROPOFOL;  Surgeon: Shirley Friar, MD;  Location: [facility] ENDOSCOPY;  Service: Endoscopy;  Laterality: N/A;   EXCISION CHRONIC LEFT BREAST ABSCESS  09-21-2010   EXCISIONAL BX LEFT BREAST MASS/  I  &  D LEFT BREAST ABSCESS  03-24-2009   KNEE ARTHROSCOPY Right 1985   left axilla biopsy  04/2013   left hip biopsy  10/2012   LYMPHADENECTOMY Bilateral 08/18/2013   Procedure: LYMPHADENECTOMY "PELVIC LYMPH NODE DISSECTION";  Surgeon: Sebastian Ache, MD;  Location: [facility] ORS;  Service: Urology;  Laterality: Bilateral;   POSTERIOR CERVICAL FUSION/FORAMINOTOMY N/A 11/30/2022   Procedure: POSTERIOR DECOMPRESSION AND FUSION CERVICAL 4- CERVICAL 5, CERVICAL 5- CERVICAL 6, CERVICAL 6- CERVICAL 7, CERVICAL 7- THORACIC 1 WITH INSTRUMENTATION AND ALLOGRAFT;  Surgeon: Estill Bamberg, MD;  Location: [facility] OR;  Service: Orthopedics;  Laterality: N/A;   PROSTATE BIOPSY N/A 04/25/2013  Procedure: BIOPSY TRANSRECTAL ULTRASONIC PROSTATE (TUBP);  Surgeon: Sebastian Ache, MD;  Location: [facility];  Service: Urology;  Laterality: N/A;   ROBOT ASSISTED LAPAROSCOPIC RADICAL PROSTATECTOMY N/A 08/18/2013   Procedure: ROBOTIC ASSISTED LAPAROSCOPIC RADICAL  PROSTATECTOMY;  Surgeon: Sebastian Ache, MD;  Location: [facility]S;  Service: Urology;  Laterality: N/A;   SHOULDER ARTHROSCOPY WITH ROTATOR CUFF REPAIR AND SUBACROMIAL DECOMPRESSION Left 03/13/2023   Procedure: SHOULDER ARTHROSCOPY WITH ROTATOR CUFF REPAIR AND SUBACROMIAL DECOMPRESSION, DISTAL CLAVICLECTOMY, BICEPS TENODESIS;  Surgeon: Sheral Apley, MD;  Location: [facility];  Service: Orthopedics;  Laterality: Left;   TOTAL KNEE ARTHROPLASTY Right 08/01/2016   08/17/16   TOTAL KNEE ARTHROPLASTY Right 08/01/2016   Procedure: TOTAL KNEE ARTHROPLASTY;  Surgeon: Sheral Apley, MD;  Location: [facility] OR;  Service: Orthopedics;  Laterality: Right;   UPPER GASTROINTESTINAL ENDOSCOPY     UPPER LEG SOFT TISSUE BIOPSY Left 2012   lthigh    FAMILY HISTORY: Family History  Problem Relation Age of Onset   Leukemia Mother    Stroke Father    Diabetes Father    Cancer Father        brain cancer   Asthma Father    Hypertension Sister    Asthma Sister    Prostate cancer Maternal Uncle    Colon cancer Neg Hx    Colon polyps Neg Hx    Esophageal cancer Neg Hx    Stomach cancer Neg Hx    Rectal cancer Neg Hx     SOCIAL HISTORY: Social History   Socioeconomic History   Marital status: Divorced    Spouse name: Not on file   Number of children: 1   Years of education: College   Highest education level: Bachelor's degree (e.g., BA, AB, BS)  Occupational History   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 declined condoms  Other Topics Concern   Not on file  Social History Narrative   Sister April stays with him currently although he hopes to return to truck driving. 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 Sister is starting medical school next year.   Patient is divorced and lives with his sister.   Patient has one adult son.   Patient is part-time, driving a truck.   Patient has a college education.   Patient is right-handed.   Patient drinks 2-3 cups of coffee and a 4-5 cups of tea.   Social Determinants of Health   Financial Resource Strain: Medium Risk (03/27/2023)   Overall Financial Resource Strain (CARDIA)    Difficulty of Paying Living Expenses: Somewhat hard  Food Insecurity: Food Insecurity Present (03/27/2023)   Hunger Vital Sign    Worried About Running Out of Food in the Last Year:  Sometimes true    Ran Out of Food in the Last Year: Sometimes true  Transportation Needs: No Transportation Needs (03/27/2023)   PRAPARE - Administrator, Civil Service (Medical): No    Lack of Transportation (Non-Medical): No  Physical Activity: Insufficiently Active (03/27/2023)   Exercise Vital Sign    Days of Exercise per Week: 2 days    Minutes of Exercise per Session: 10 min  Stress: No Stress Concern Present (03/27/2023)   [employer] of Occupational Health - Occupational Stress Questionnaire    Feeling of Stress : Not at all  Social Connections: Moderately Integrated (03/27/2023)   Social Connection and Isolation Panel [NHANES]    Frequency of Communication with Friends and Family: More than three times a week    Frequency of Social Gatherings with Friends and Family: Twice a week    Attends Religious Services: More than 4 times per year    Active Member of [employer] or Organizations: Yes    Attends Banker Meetings: More than  4 times per year    Marital Status: Divorced  Intimate Partner Violence: Not At Risk (03/30/2023)   Humiliation, Afraid, Rape, and Kick questionnaire    Fear of Current or Ex-Partner: No    Emotionally Abused: No    Physically Abused: No    Sexually Abused: No      Levert Feinstein, M.D. Ph.D.  [facility] [address] [place], [place] [address] Ph: [phone] Fax: [phone]  CC:  Corwin Levins, MD [address] [place],  [place] [address]  Corwin Levins, MD

## 2023-04-25 NOTE — Procedures (Signed)
Full Name: Casey Brennan Gender: Male MRN #: [id number] Date of Birth: [birth year]    Visit Date: 04/25/2023 09:28 Age: 62 Years Examining Physician: Dr. Levert Feinstein Referring Physician: Dr. Porfirio Mylar Dohmeier Height: 5 feet 9 inch History: 62 year old male presenting history of bilateral fingertips, toes cold sensation, change colors since his fostbite many years ago.  Summary of the test: Nerve conduction study: Bilateral tibial, peroneal to EDB motor responses were normal, with robust CMAP response.  Right sural, superficial peroneal sensory responses were normal, with slightly decreased snap amplitude.  Left sural, superficial peroneal sensory responses were absent, also obscured by his frequent muscle movement during stimulation.  He does not want to proceed repeat testing.  We had to abort the process early.  Electromyography: Selected needle examination of left lower extremity muscles were normal.  Conclusion: This is a slight abnormal yet technically difficult study.  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   ------------------------------- Levert Feinstein, M.D. PhD  [facility] [address] [place], [place] [address] Tel: [phone] Fax: [phone]  Verbal informed consent was obtained from the patient, patient was informed of potential risk of procedure, including bruising, bleeding, hematoma formation, infection, muscle weakness, muscle pain, numbness, among others.        MNC    Nerve / Sites Muscle Latency Ref. Amplitude Ref. Rel Amp Segments Distance Velocity Ref. Area    ms ms mV mV %  cm m/s m/s mVms  L Peroneal - EDB     Ankle EDB 4.5 <=6.5 5.3 >=2.0 100 Ankle - EDB 9   20.6     Fib head EDB 10.9  4.8  91.1 Fib head - Ankle 30 46 >=44 19.7     Pop fossa EDB  13.5  3.9  80.9 Pop fossa - Fib head 12 46 >=44 13.0         Pop fossa - Ankle      R Peroneal - EDB     Ankle EDB 4.4 <=6.5 6.0 >=2.0 100 Ankle - EDB 9   21.3     Fib head EDB 10.7  4.0  67.4 Fib head - Ankle 29 46 >=44 14.7     Pop fossa EDB 13.1  4.7  117 Pop fossa - Fib head 11 45 >=44 16.1         Pop fossa - Ankle      L Tibial - AH     Ankle AH 3.9 <=5.8 9.9 >=4.0 100 Ankle - AH 9   25.9     Pop fossa AH 12.7  4.5  45.7 Pop fossa - Ankle 39 45 >=41 13.9  R Tibial - AH     Ankle AH 4.3 <=5.8 9.8 >=4.0 100 Ankle - AH 9   25.9     Pop fossa AH 12.6  2.1  21.7 Pop fossa - Ankle 36 43 >=41 7.7             SNC    Nerve / Sites Rec. Site Peak Lat Ref.  Amp Ref. Segments Distance    ms ms V V  cm  L Sural - Ankle (Calf)     Calf Ankle NR <=4.4 NR >=6 Calf - Ankle 14  R Sural - Ankle (Calf)     Calf Ankle 3.8 <=4.4 4 >=6 Calf -  Ankle 14  R Superficial peroneal - Ankle     Lat leg Ankle 4.3 <=4.4 4 >=6 Lat leg - Ankle 14             F  Wave    Nerve F Lat Ref.   ms ms  L Tibial - AH 52.4 <=56.0  R Tibial - AH 44.6 <=56.0         EMG Summary Table    Spontaneous MUAP Recruitment  Muscle IA Fib PSW Fasc Other Amp Dur. Poly Pattern  L. Tibialis anterior Normal None None None _______ Normal Normal Normal Normal  L. Tibialis posterior Normal None None None _______ Normal Normal Normal Normal  L. Peroneus longus Normal None None None _______ Normal Normal Normal Normal  L. Gastrocnemius (Medial head) Normal None None None _______ Normal Normal Normal Normal  L. Lumbar paraspinals Normal None None None _______ Normal Normal Normal Normal

## 2023-04-25 NOTE — Progress Notes (Signed)
EMG report is under procedure tab. 

## 2023-04-25 NOTE — Telephone Encounter (Signed)
Patients sister April Obie Dredge called to schedule patient for a cabenuva injection, Mrs.Gladys were you helping assist the patient?

## 2023-04-26 ENCOUNTER — Encounter: Payer: Self-pay | Admitting: Neurology

## 2023-04-26 ENCOUNTER — Inpatient Hospital Stay: Admission: RE | Admit: 2023-04-26 | Payer: 59 | Source: Ambulatory Visit

## 2023-04-27 ENCOUNTER — Encounter: Payer: 59 | Admitting: Internal Medicine

## 2023-04-30 ENCOUNTER — Telehealth: Payer: Self-pay | Admitting: Physical Medicine and Rehabilitation

## 2023-04-30 NOTE — Progress Notes (Signed)
Addendum: Reviewed and agree with assessment and management plan. Kadijah Shamoon M, MD  

## 2023-04-30 NOTE — Telephone Encounter (Signed)
Faxed medical records release form per patient's sister April request        confirmed fax was received

## 2023-05-01 ENCOUNTER — Other Ambulatory Visit (HOSPITAL_COMMUNITY): Payer: Self-pay

## 2023-05-01 ENCOUNTER — Telehealth: Payer: Self-pay | Admitting: Pharmacy Technician

## 2023-05-01 NOTE — Telephone Encounter (Signed)
Pharmacy Patient Advocate Encounter   Received notification from CoverMyMeds that prior authorization for Nurtec 75MG  dispersible tablets is required/requested.   Insurance verification completed.   The patient is insured through [facility] .   Per test claim: PA required; PA submitted to above mentioned insurance via CoverMyMeds Key/confirmation #/EOC [id number] Status is pending

## 2023-05-02 ENCOUNTER — Other Ambulatory Visit (HOSPITAL_COMMUNITY): Payer: Self-pay

## 2023-05-02 DIAGNOSIS — M25612 Stiffness of left shoulder, not elsewhere classified: Secondary | ICD-10-CM | POA: Diagnosis not present

## 2023-05-02 DIAGNOSIS — I739 Peripheral vascular disease, unspecified: Secondary | ICD-10-CM | POA: Diagnosis not present

## 2023-05-02 DIAGNOSIS — M7522 Bicipital tendinitis, left shoulder: Secondary | ICD-10-CM | POA: Diagnosis not present

## 2023-05-02 DIAGNOSIS — S46012D Strain of muscle(s) and tendon(s) of the rotator cuff of left shoulder, subsequent encounter: Secondary | ICD-10-CM | POA: Diagnosis not present

## 2023-05-02 DIAGNOSIS — M6281 Muscle weakness (generalized): Secondary | ICD-10-CM | POA: Diagnosis not present

## 2023-05-03 DIAGNOSIS — Z0271 Encounter for disability determination: Secondary | ICD-10-CM

## 2023-05-08 ENCOUNTER — Inpatient Hospital Stay: Admission: RE | Admit: 2023-05-08 | Payer: 59 | Source: Ambulatory Visit

## 2023-05-09 ENCOUNTER — Encounter: Payer: Self-pay | Admitting: Internal Medicine

## 2023-05-09 ENCOUNTER — Ambulatory Visit: Payer: 59 | Admitting: Internal Medicine

## 2023-05-09 ENCOUNTER — Other Ambulatory Visit: Payer: Self-pay | Admitting: Infectious Diseases

## 2023-05-09 ENCOUNTER — Ambulatory Visit (INDEPENDENT_AMBULATORY_CARE_PROVIDER_SITE_OTHER): Payer: 59 | Admitting: Neurology

## 2023-05-09 ENCOUNTER — Telehealth: Payer: Self-pay | Admitting: Infectious Diseases

## 2023-05-09 ENCOUNTER — Telehealth: Payer: Self-pay

## 2023-05-09 ENCOUNTER — Encounter: Payer: Self-pay | Admitting: Neurology

## 2023-05-09 VITALS — BP 134/86 | HR 91 | Ht 69.0 in | Wt 218.0 lb

## 2023-05-09 VITALS — BP 118/70 | HR 85 | Temp 99.5°F | Ht 69.0 in | Wt 218.0 lb

## 2023-05-09 DIAGNOSIS — R062 Wheezing: Secondary | ICD-10-CM | POA: Diagnosis not present

## 2023-05-09 DIAGNOSIS — R6889 Other general symptoms and signs: Secondary | ICD-10-CM | POA: Diagnosis not present

## 2023-05-09 DIAGNOSIS — R739 Hyperglycemia, unspecified: Secondary | ICD-10-CM

## 2023-05-09 DIAGNOSIS — G63 Polyneuropathy in diseases classified elsewhere: Secondary | ICD-10-CM | POA: Diagnosis not present

## 2023-05-09 DIAGNOSIS — R051 Acute cough: Secondary | ICD-10-CM | POA: Diagnosis not present

## 2023-05-09 DIAGNOSIS — R202 Paresthesia of skin: Secondary | ICD-10-CM

## 2023-05-09 DIAGNOSIS — E559 Vitamin D deficiency, unspecified: Secondary | ICD-10-CM

## 2023-05-09 DIAGNOSIS — B20 Human immunodeficiency virus [HIV] disease: Secondary | ICD-10-CM | POA: Diagnosis not present

## 2023-05-09 DIAGNOSIS — J449 Chronic obstructive pulmonary disease, unspecified: Secondary | ICD-10-CM

## 2023-05-09 DIAGNOSIS — R7981 Abnormal blood-gas level: Secondary | ICD-10-CM

## 2023-05-09 DIAGNOSIS — I10 Essential (primary) hypertension: Secondary | ICD-10-CM | POA: Diagnosis not present

## 2023-05-09 LAB — POCT INFLUENZA A/B
Influenza A, POC: NEGATIVE
Influenza B, POC: NEGATIVE

## 2023-05-09 LAB — POCT RESPIRATORY SYNCYTIAL VIRUS: RSV Rapid Ag: NEGATIVE

## 2023-05-09 LAB — POC COVID19 BINAXNOW: SARS Coronavirus 2 Ag: NEGATIVE

## 2023-05-09 MED ORDER — METHYLPREDNISOLONE ACETATE 80 MG/ML IJ SUSP
80.0000 mg | Freq: Once | INTRAMUSCULAR | Status: AC
Start: 2023-05-09 — End: 2023-05-09
  Administered 2023-05-09: 80 mg via INTRAMUSCULAR

## 2023-05-09 MED ORDER — HYDROCODONE BIT-HOMATROP MBR 5-1.5 MG/5ML PO SOLN: 5.0000 mL | Freq: Four times a day (QID) | ORAL | 0 refills | Status: DC | PRN

## 2023-05-09 MED ORDER — PREDNISONE 10 MG PO TABS: ORAL_TABLET | ORAL | 0 refills | Status: DC

## 2023-05-09 MED ORDER — CABOTEGRAVIR & RILPIVIRINE ER 600 & 900 MG/3ML IM SUER
1.0000 | INTRAMUSCULAR | Status: DC
Start: 2023-05-09 — End: 2023-05-16

## 2023-05-09 MED ORDER — ALBUTEROL SULFATE HFA 108 (90 BASE) MCG/ACT IN AERS: 2.0000 | INHALATION_SPRAY | Freq: Four times a day (QID) | RESPIRATORY_TRACT | 3 refills | Status: DC | PRN

## 2023-05-09 MED ORDER — DOXYCYCLINE HYCLATE 100 MG PO TABS: 100.0000 mg | ORAL_TABLET | Freq: Two times a day (BID) | ORAL | 0 refills | Status: DC

## 2023-05-09 MED ORDER — HYDROCOD POLI-CHLORPHE POLI ER 10-8 MG/5ML PO SUER: 5.0000 mL | Freq: Two times a day (BID) | ORAL | 0 refills | Status: DC | PRN

## 2023-05-09 MED ORDER — CABOTEGRAVIR & RILPIVIRINE ER 600 & 900 MG/3ML IM SUER
1.0000 | INTRAMUSCULAR | Status: DC
Start: 2023-07-09 — End: 2023-05-25

## 2023-05-09 NOTE — Assessment & Plan Note (Signed)
Pt encouraged to go to ED but declines

## 2023-05-09 NOTE — Telephone Encounter (Signed)
error 

## 2023-05-09 NOTE — Patient Instructions (Addendum)
You had the steroid shot today  Please take all new medication as prescribed - the antibiotic, cough medicine, inhaler and prednisone  Please continue all other medications as before, and refills have been done if requested.  Please have the pharmacy call with any other refills you may need.  Please continue your efforts at being more active, low cholesterol diet, and weight control.  You are otherwise up to date with prevention measures today.  Please keep your appointments with your specialists as you may have planned  Please go to the XRAY Department in the first floor for the x-ray testing  Please go to the LAB at the blood drawing area for the tests to be done  You will be contacted by phone if any changes need to be made immediately.  Otherwise, you will receive a letter about your results with an explanation, but please check with MyChart first.

## 2023-05-09 NOTE — Assessment & Plan Note (Signed)
Mild , for inhaler prn, depomedrol I'm 80 mg, prednisone taper,,  to f/u any worsening symptoms or concerns

## 2023-05-09 NOTE — Assessment & Plan Note (Signed)
Mild to mod, for antibx course doxycycline 100 bid, cough med prn, for cxr r/o pna and lab including cbc,  to f/u any worsening symptoms or concerns
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
## 2023-05-09 NOTE — Assessment & Plan Note (Signed)
Last vitamin D Lab Results  Component Value Date   VD25OH 27.12 (L) 08/14/2022   Low, to start oral replacement  

## 2023-05-09 NOTE — Telephone Encounter (Signed)
Pharmacy Patient Advocate Encounter  Received notification from [facility] that Prior Authorization for Nurtec 75MG  dispersible tablets has been APPROVED from 05/01/2023 to 07/02/2024   PA #/Case ID/Reference #: PA Case ID #: [id number]

## 2023-05-09 NOTE — Telephone Encounter (Signed)
-----   Message from Carie Caddy Pyrtle sent at 04/23/2023  4:30 PM EDT ----- Regarding: RE: Neldon Newport and Marchelle Folks Can you please keep this man in your mind or list should an [facility] procedure open up before his currently scheduled procedure day Thanks JMP ----- Message ----- From: Unk Lightning, PA Sent: 04/19/2023   3:01 PM EDT To: Beverley Fiedler, MD Subject: egd                                            Saw patient today, worsening dysphagia.  Eosinophilic esophagitis with stricture in the past dilated by you as well as gastric ulcers which were nonhealing.  He has an EGD scheduled at the end of November with you but would love to get in sooner if possible.  He does not want a different provider.  Just thought I would add him to your cancellation list in case this is possible.  Thanks, JL L

## 2023-05-09 NOTE — Patient Instructions (Addendum)
I mentioned a trial of alpha lipoic fatty acids as supplement.   Carpal Tunnel Syndrome  Carpal tunnel syndrome is a condition that causes pain, numbness, and weakness in your hand and fingers. The carpal tunnel is a narrow area located on the palm side of your wrist. Repeated wrist motion or certain diseases may cause swelling within the tunnel. This swelling pinches the main nerve in the wrist. The main nerve in the wrist is called the median nerve. What are the causes? This condition may be caused by: Repeated and forceful wrist and hand motions. Wrist injuries. Arthritis. A cyst or tumor in the carpal tunnel. Fluid buildup during pregnancy. Use of tools that vibrate. Sometimes the cause of this condition is not known. What increases the risk? The following factors may make you more likely to develop this condition: Having a job that requires you to repeatedly or forcefully move your wrist or hand or requires you to use tools that vibrate. This may include jobs that involve using computers, working on an [employer], or working with power tools such as Radiographer, therapeutic. Being a woman. Having certain conditions, such as: Diabetes. Obesity. An underactive thyroid (hypothyroidism). Kidney failure. Rheumatoid arthritis. What are the signs or symptoms? Symptoms of this condition include: A tingling feeling in your fingers, especially in your thumb, index, and middle fingers. Tingling or numbness in your hand. An aching feeling in your entire arm, especially when your wrist and elbow are bent for a long time. Wrist pain that goes up your arm to your shoulder. Pain that goes down into your palm or fingers. A weak feeling in your hands. You may have trouble grabbing and holding items. Your symptoms may feel worse during the night. How is this diagnosed? This condition is diagnosed with a medical history and physical exam. You may also have tests, including: Electromyogram (EMG). This  test measures electrical signals sent by your nerves into the muscles. Nerve conduction study. This test measures how well electrical signals pass through your nerves. Imaging tests, such as X-rays, ultrasound, and MRI. These tests check for possible causes of your condition. How is this treated? This condition may be treated with: Lifestyle changes. It is important to stop or change the activity that caused your condition. Doing exercise and activities to strengthen and stretch your muscles and tendons (physical therapy). Making lifestyle changes to help with your condition and learning how to do your daily activities safely (occupational therapy). Medicines for pain and inflammation. This may include medicine that is injected into your wrist. A wrist splint or brace. Surgery. Follow these instructions at home: If you have a splint or brace: Wear the splint or brace as told by your health care provider. Remove it only as told by your health care provider. Loosen the splint or brace if your fingers tingle, become numb, or turn cold and blue. Keep the splint or brace clean. If the splint or brace is not waterproof: Do not let it get wet. Cover it with a watertight covering when you take a bath or shower. Managing pain, stiffness, and swelling If directed, put ice on the painful area. To do this: If you have a removeable splint or brace, remove it as told by your health care provider. Put ice in a plastic bag. Place a towel between your skin and the bag or between the splint or brace and the bag. Leave the ice on for 20 minutes, 2-3 times a day. Do not fall asleep with  the cold pack on your skin. Remove the ice if your skin turns bright red. This is very important. If you cannot feel pain, heat, or cold, you have a greater risk of damage to the area. Move your fingers often to reduce stiffness and swelling. General instructions Take over-the-counter and prescription medicines only as told  by your health care provider. Rest your wrist and hand from any activity that may be causing your pain. If your condition is work related, talk with your employer about changes that can be made, such as getting a wrist pad to use while typing. Do any exercises as told by your health care provider, physical therapist, or occupational therapist. Keep all follow-up visits. This is important. Contact a health care provider if: You have new symptoms. Your pain is not controlled with medicines. Your symptoms get worse. Get help right away if: You have severe numbness or tingling in your wrist or hand. Summary Carpal tunnel syndrome is a condition that causes pain, numbness, and weakness in your hand and fingers. It is usually caused by repeated wrist motions. Lifestyle changes and medicines are used to treat carpal tunnel syndrome. Surgery may be recommended. Follow your health care provider's instructions about wearing a splint, resting from activity, keeping follow-up visits, and calling for help. This information is not intended to replace advice given to you by your health care provider. Make sure you discuss any questions you have with your health care provider. Document Revised: 10/30/2019 Document Reviewed: 10/30/2019 Elsevier Patient Education  2024 [employer].

## 2023-05-09 NOTE — Assessment & Plan Note (Signed)
BP Readings from Last 3 Encounters:  05/09/23 118/70  05/09/23 134/86  04/19/23 124/70   Stable, pt to continue medical treatment toprol xl 100 qd

## 2023-05-09 NOTE — Telephone Encounter (Deleted)
Pts egd moved to 05/29/23 with Dr. Russella Dar at 11am. Pt will need to arrive there at 9:30am. Left message for pt to call back.

## 2023-05-09 NOTE — Progress Notes (Signed)
Patient ID: BASEL DEFALCO, male   DOB: [birth year], 62 y.o.   MRN: [id number]        Chief Complaint: follow up cough, wheezing, low oxygen sat,        HPI:  LEANDER TOUT is a 62 y.o. male here with c/o Here with acute onset mild to mod 2-3 days ST, HA, general weakness and malaise, with prod cough clearish sputum, but Pt denies chest pain, increased sob or doe, wheezing, orthopnea, PND, increased LE swelling, palpitations, dizziness or syncope, except for onset mild wheezing sob since last pm, and has borderline low o2 sat.   Pt denies polydipsia, polyuria, or new focal neuro s/s.    Pt denies recent wt loss, night sweats, loss of appetite, or other constitutional symptoms         Wt Readings from Last 3 Encounters:  05/09/23 218 lb (98.9 kg)  05/09/23 218 lb (98.9 kg)  04/25/23 205 lb (93 kg)   BP Readings from Last 3 Encounters:  05/09/23 118/70  05/09/23 134/86  04/19/23 124/70         Past Medical History:  Diagnosis Date   Asthma    very rare   Chronic anemia    normocytic   Chronic folliculitis    Chronic kidney disease    Stage 2   COPD (chronic obstructive pulmonary disease) (HCC)    Dyspnea    Environmental and seasonal allergies    Glaucoma    right   H/O pericarditis    2010--  myopercarditis--  resolved   Headache(784.0)    HX SEVERE FRONTAL HA'S   Hiatal hernia    History of gastric ulcer    nonbleeding gastric ulcer 58//24   History of kidney stones    History of MRSA infection 2010   infected boil   HIV (human immunodeficiency virus infection) (HCC) 1988   Hypertension    Hypertensive cardiomyopathy (HCC)    EF 37%, normal coronaries 04/2020 [facility]; LVEF 60-65% 03/2022 echo   Lytic bone lesion of hip    WORK-UP DONE BY ONCOLOGIST DR HA --  NOT MALIGNANT   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   Pneumonia    hx of   Post concussion syndrome    resolved   Prostate cancer (HCC) 04/25/2013   gleason 3+3=6, volume 30 gm   Viral meningitis     06/2021   Past Surgical History:  Procedure Laterality Date   ACDCF  09/29/2020   ANTERIOR CERVICAL DECOMPRESSION/DISCECTOMY FUSION 4 LEVELS N/A 09/29/2020   Procedure: ANTERIOR CERVICAL DECOMPRESSION FUSION CERVICAL 3-CERVICAL 4 , CERVICAL 4 - CERVICAL 5, CERVICAL 5 - CERVICAL 6 WITH INSTRUMENTATION AND ALLOGRAFT;  Surgeon: Estill Bamberg, MD;  Location: [facility] OR;  Service: Orthopedics;  Laterality: N/A;   CARDIAC CATHETERIZATION  03-23-2009  DR COOPER   NORMAL CORONARY ARTERIES   CHOLECYSTECTOMY N/A 11/06/2014   Procedure: LAPAROSCOPIC CHOLECYSTECTOMY WITH INTRAOPERATIVE CHOLANGIOGRAM;  Surgeon: Jimmye Norman, MD;  Location: [facility] OR;  Service: General;  Laterality: N/A;   COLONOSCOPY  12/26/2011   Procedure: COLONOSCOPY;  Surgeon: Shirley Friar, MD;  Location: [facility] ENDOSCOPY;  Service: Endoscopy;  Laterality: N/A;   COLONOSCOPY     COLONOSCOPY WITH PROPOFOL N/A 07/29/2014   Procedure: COLONOSCOPY WITH PROPOFOL;  Surgeon: Shirley Friar, MD;  Location: [facility]OSCOPY;  Service: Endoscopy;  Laterality: N/A;   CYSTOSCOPY N/A 05/18/2018   Procedure: CYSTOSCOPY REMOVAL FOREIGN BODY;  Surgeon: Ihor Gully, MD;  Location: [facility] ORS;  Service: Urology;  Laterality: N/A;   CYSTOSCOPY/RETROGRADE/URETEROSCOPY Bilateral 04/25/2013   Procedure: CYSTOSCOPY/ BILATERAL RETROGRADES; BLADDER BIOPSIES;  Surgeon: Sebastian Ache, MD;  Location: [facility];  Service: Urology;  Laterality: Bilateral;   DENTAL EXAMINATION UNDER ANESTHESIA     ESOPHAGOGASTRODUODENOSCOPY  12/26/2011   Procedure: ESOPHAGOGASTRODUODENOSCOPY (EGD);  Surgeon: Shirley Friar, MD;  Location: [facility];  Service: Endoscopy;  Laterality: N/A;   ESOPHAGOGASTRODUODENOSCOPY (EGD) WITH PROPOFOL N/A 07/29/2014   Procedure: ESOPHAGOGASTRODUODENOSCOPY (EGD) WITH PROPOFOL;  Surgeon: Shirley Friar, MD;  Location: [facility] ENDOSCOPY;  Service: Endoscopy;  Laterality: N/A;   EXCISION CHRONIC LEFT BREAST ABSCESS  09-21-2010    EXCISIONAL BX LEFT BREAST MASS/  I  &  D LEFT BREAST ABSCESS  03-24-2009   KNEE ARTHROSCOPY Right 1985   left axilla biopsy  04/2013   left hip biopsy  10/2012   LYMPHADENECTOMY Bilateral 08/18/2013   Procedure: LYMPHADENECTOMY "PELVIC LYMPH NODE DISSECTION";  Surgeon: Sebastian Ache, MD;  Location: [facility];  Service: Urology;  Laterality: Bilateral;   POSTERIOR CERVICAL FUSION/FORAMINOTOMY N/A 11/30/2022   Procedure: POSTERIOR DECOMPRESSION AND FUSION CERVICAL 4- CERVICAL 5, CERVICAL 5- CERVICAL 6, CERVICAL 6- CERVICAL 7, CERVICAL 7- THORACIC 1 WITH INSTRUMENTATION AND ALLOGRAFT;  Surgeon: Estill Bamberg, MD;  Location: [facility] OR;  Service: Orthopedics;  Laterality: N/A;   PROSTATE BIOPSY N/A 04/25/2013   Procedure: BIOPSY TRANSRECTAL ULTRASONIC PROSTATE (TUBP);  Surgeon: Sebastian Ache, MD;  Location: [facility];  Service: Urology;  Laterality: N/A;   ROBOT ASSISTED LAPAROSCOPIC RADICAL PROSTATECTOMY N/A 08/18/2013   Procedure: ROBOTIC ASSISTED LAPAROSCOPIC RADICAL PROSTATECTOMY;  Surgeon: Sebastian Ache, MD;  Location: [facility];  Service: Urology;  Laterality: N/A;   SHOULDER ARTHROSCOPY WITH ROTATOR CUFF REPAIR AND SUBACROMIAL DECOMPRESSION Left 03/13/2023   Procedure: SHOULDER ARTHROSCOPY WITH ROTATOR CUFF REPAIR AND SUBACROMIAL DECOMPRESSION, DISTAL CLAVICLECTOMY, BICEPS TENODESIS;  Surgeon: Sheral Apley, MD;  Location: [facility];  Service: Orthopedics;  Laterality: Left;   TOTAL KNEE ARTHROPLASTY Right 08/01/2016   08/17/16   TOTAL KNEE ARTHROPLASTY Right 08/01/2016   Procedure: TOTAL KNEE ARTHROPLASTY;  Surgeon: Sheral Apley, MD;  Location: [facility] OR;  Service: Orthopedics;  Laterality: Right;   UPPER GASTROINTESTINAL ENDOSCOPY     UPPER LEG SOFT TISSUE BIOPSY Left 2012   lthigh    reports that he quit smoking about 12 years ago. His smoking use included cigars and cigarettes. He started smoking about 35 years ago. He has a 34.5 pack-year smoking history. He has  never used smokeless tobacco. He reports that he does not currently use alcohol. He reports that he does not use drugs. family history includes Asthma in his father and sister; Cancer in his father; Diabetes in his father; Hypertension in his sister; Leukemia in his mother; Prostate cancer in his maternal uncle; Stroke in his father. Allergies  Allergen Reactions   Amoxapine Shortness Of Breath   Chocolate Shortness Of Breath and Other (See Comments)    Asthma attack, welts   Ciprofloxacin Anaphylaxis, Dermatitis, Itching, Nausea And Vomiting, Shortness Of Breath, Swelling and Hives   Descovy [Emtricitabine-Tenofovir Af] Shortness Of Breath    Nasuea, vomiiting, diarrhea, sob, whelps, throat closes   Doxycycline Hyclate Shortness Of Breath and Swelling   Genvoya [Elviteg-Cobic-Emtricit-Tenofaf] Shortness Of Breath    Nausea, vomitting, diarreha, sob, rash, throat closing   Morphine And Codeine Shortness Of Breath, Itching and Other (See Comments)     chest pain   Other Anaphylaxis   Oxycodone-Acetaminophen Shortness Of Breath   Penicillins Shortness  Of Breath and Rash    Has patient had a PCN reaction causing immediate rash, facial/tongue/throat swelling, SOB or lightheadedness with hypotension: Yes Has patient had a PCN reaction causing severe rash involving mucus membranes or skin necrosis: No Has patient had a PCN reaction that required hospitalization No Has patient had a PCN reaction occurring within the last 10 years: Yes If all of the above answers are "NO", then may proceed with Cephalosporin use.   Sulfa Antibiotics Anaphylaxis and Other (See Comments)   Tomato Shortness Of Breath   Vancomycin Shortness Of Breath and Other (See Comments)   Darunavir Itching   Amlodipine Other (See Comments)    swelling   Carvedilol     LEG SWELLING, DIZZNESS   Flagyl [Metronidazole] Itching   Meloxicam    Milk-Related Compounds    Morphine Other (See Comments)   Oxycodone Hcl Other (See  Comments)   Penicillin G Other (See Comments)   Toradol [Ketorolac Tromethamine] Nausea And Vomiting    itching   Acetaminophen Rash and Other (See Comments)    Pt tolerates percocet and also plain APAP without rash   Tramadol Itching, Nausea And Vomiting and Other (See Comments)   Current Outpatient Medications on File Prior to Visit  Medication Sig Dispense Refill   albuterol (PROVENTIL) (2.5 MG/3ML) 0.083% nebulizer solution Take 3 mLs (2.5 mg total) by nebulization every 6 (six) hours as needed for wheezing or shortness of breath. 75 mL 3   ALPRAZolam (XANAX) 0.5 MG tablet TAKE 1 TABLET(0.5 MG) BY MOUTH TWICE DAILY AS NEEDED FOR SLEEP 60 tablet 5   AMBULATORY NON FORMULARY MEDICATION Budesonide slurry 2 mg/10 ml-  Give 2 mg (10 ml) twice daily x 4 weeks; pt to swallow; npo 30 minutes after to avoid steroid washing from esophagus 560 mL 1   azelastine (ASTELIN) 0.1 % nasal spray Place 2 sprays into both nostrils in the morning. Use in each nostril as directed     BREZTRI AEROSPHERE 160-9-4.8 MCG/ACT AERO INHALE 2 PUFFS INTO THE LUNGS IN THE MORNING AND AT BEDTIME 10.7 g 3   cetirizine (ZYRTEC) 10 MG tablet TAKE 1 TABLET(10 MG) BY MOUTH DAILY 90 tablet 3   Cholecalciferol (VITAMIN D3) 250 MCG (10000 UT) capsule Take 1 tablet by mouth daily. [employer]     cyclobenzaprine (FLEXERIL) 10 MG tablet Take 1 tablet (10 mg total) by mouth at bedtime. 90 tablet 3   diphenhydrAMINE (BENADRYL) 25 MG tablet Take 1 tablet (25 mg total) by mouth every 6 (six) hours as needed. (Patient taking differently: Take 25 mg by mouth every 6 (six) hours as needed for sleep, itching or allergies.) 30 tablet 0   dorzolamide (TRUSOPT) 2 % ophthalmic solution Place 1 drop into both eyes 2 (two) times daily.     EPINEPHrine 0.3 mg/0.3 mL IJ SOAJ injection Inject 0.3 mg into the muscle as needed for anaphylaxis. 1 each 5   famotidine (PEPCID) 40 MG tablet Take 1 tablet (40 mg total) by mouth at bedtime. 90 tablet 3    gabapentin (NEURONTIN) 300 MG capsule TAKE 1 CAPSULE(300 MG) BY MOUTH FOUR TIMES DAILY AS NEEDED FOR PAIN 120 capsule 4   Glucos-Chond-Hyal Ac-Ca Fructo (MOVE FREE JOINT HEALTH ADVANCE PO) Take 1 tablet by mouth daily.     HYDROcodone-acetaminophen (NORCO) 7.5-325 MG tablet Take 1-2 tablets by mouth every 6 (six) hours as needed for severe pain. 40 tablet 0   hydrOXYzine (ATARAX) 50 MG tablet TAKE 1 TABLET(50 MG) BY MOUTH  TWICE DAILY 60 tablet 1   meclizine (ANTIVERT) 25 MG tablet Take 1 tablet (25 mg total) by mouth daily as needed. 40 tablet 5   methocarbamol (ROBAXIN) 500 MG tablet Take 500 mg by mouth 4 (four) times daily.     methocarbamol (ROBAXIN) 500 MG tablet Take 1-2 tablets (500-1,000 mg total) by mouth every 6 (six) hours as needed for muscle spasms. 30 tablet 2   metoprolol succinate (TOPROL-XL) 100 MG 24 hr tablet TAKE 1 TABLET(100 MG) BY MOUTH DAILY WITH OR IMMEDIATELY FOLLOWING A MEAL 30 tablet 0   montelukast (SINGULAIR) 10 MG tablet Take 10 mg by mouth daily.     Multiple Vitamin (MULTIVITAMIN WITH MINERALS) TABS tablet Take 1 tablet by mouth daily after breakfast.      mupirocin ointment (BACTROBAN) 2 % APPLY EXTERNALLY TO THE AFFECTED AREA TWICE DAILY FOR 3 WEEKS, PLACE COTTON BETWEEN TOES TO ALLOW BETTER AERATION AS NEEDED (Patient taking differently: Apply 1 application  topically daily as needed (affected toes).) 66 g 1   ondansetron (ZOFRAN-ODT) 4 MG disintegrating tablet Take 1 tablet (4 mg total) by mouth every 8 (eight) hours as needed for nausea or vomiting. (Patient taking differently: Take 4 mg by mouth every 4 (four) hours as needed for nausea or vomiting.) 30 tablet 2   OVER THE COUNTER MEDICATION Take 2 capsules by mouth daily. Zhou: thyroid supplement     pantoprazole (PROTONIX) 40 MG tablet Take 1 tablet (40 mg total) by mouth 2 (two) times daily before a meal. 180 tablet 3   QNASL 80 MCG/ACT AERS USE 2 SPRAYS IN EACH NOSTRIL DAILY (Patient taking differently: Place 2  sprays into both nostrils in the morning and at bedtime.) 10.6 g 2   QULIPTA 30 MG TABS Take 1 tablet (30 mg total) by mouth daily. 30 tablet 5   Rimegepant Sulfate (NURTEC) 75 MG TBDP Take 1 tablet (75 mg total) by mouth as needed (take at onset of migraine (do NOT take more than 1 tablet in 24 hour time)).     rosuvastatin (CRESTOR) 20 MG tablet TAKE 1 TABLET(20 MG) BY MOUTH DAILY (Patient taking differently: Take 20 mg by mouth daily.) 90 tablet 3   tizanidine (ZANAFLEX) 6 MG capsule Take 6 mg by mouth 3 (three) times daily.     torsemide (DEMADEX) 20 MG tablet Take 10 mg by mouth See admin instructions. Tuesday Thursday Saturday     Ubrogepant (UBRELVY) 100 MG TABS Take 1 tablet (100 mg total) by mouth as needed (take 1 tablet at onset of migraine. may take an additional tablet 2 hrs later if needed (Do not exceed more than 2 tablets in 24 hour period)). 16 tablet 5   nitroGLYCERIN (NITROSTAT) 0.4 MG SL tablet Place 1 tablet (0.4 mg total) under the tongue every 5 (five) minutes as needed for chest pain. 30 tablet 1   [DISCONTINUED] zolpidem (AMBIEN) 5 MG tablet Take 1-2 tablets (5-10 mg total) by mouth at bedtime as needed for sleep. (Patient not taking: Reported on 04/23/2019) 30 tablet 2   No current facility-administered medications on file prior to visit.        ROS:  All others reviewed and negative.  Objective        PE:  BP 118/70 (BP Location: Right Arm, Patient Position: Sitting, Cuff Size: Normal)   Pulse 85   Temp 99.5 F (37.5 C) (Oral)   Ht 5\' 9"  (1.753 m)   Wt 218 lb (98.9 kg)   SpO2 [name])  88%   BMI 32.19 kg/m                 Constitutional: Pt appears in NAD, no accessory muscle use               HENT: Head: NCAT.                Right Ear: External ear normal.                 Left Ear: External ear normal. Bilat tm's with mild erythema.  Max sinus areas non tender.  Pharynx with mild erythema, no exudate                Eyes: . Pupils are equal, round, and reactive to  light. Conjunctivae and EOM are normal               Nose: without d/c or deformity               Neck: Neck supple. Gross normal ROM               Cardiovascular: Normal rate and regular rhythm.                 Pulmonary/Chest: Effort normal and breath sounds without rales but with few mild bilat insp wheezing.                Abd:  Soft, NT, ND, + BS, no organomegaly               Neurological: Pt is alert. At baseline orientation, motor grossly intact               Skin: Skin is warm. No rashes, no other new lesions, LE edema - none               Psychiatric: Pt behavior is normal without agitation   Micro: none  Cardiac tracings I have personally interpreted today:  none  Pertinent Radiological findings (summarize): none   Lab Results  Component Value Date   WBC 5.8 12/06/2022   HGB 12.6 (L) 12/06/2022   HCT 38.0 (L) 12/06/2022   PLT 292 12/06/2022   GLUCOSE 96 03/12/2023   CHOL 139 08/14/2022   TRIG 101.0 08/14/2022   HDL 39.10 08/14/2022   LDLCALC 80 08/14/2022   ALT 53 (H) 12/06/2022   AST 40 12/06/2022   NA 135 03/12/2023   K 3.6 03/12/2023   CL 105 03/12/2023   CREATININE 1.17 03/12/2023   BUN 7 03/12/2023   CO2 25 03/12/2023   TSH 2.51 08/14/2022   PSA 0.00 (L) 08/14/2022   INR 1.0 06/23/2021   HGBA1C <4.2 08/15/2022   MICROALBUR 16.88 (H) 10/05/2011   POCT - COVID - neg, Flu - neg, RSV - neg  Assessment/Plan:  HASHEEM VOLAND is a 62 y.o. 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  Vitamin D deficiency Last vitamin D Lab Results  Component  Value Date   VD25OH 27.12 (L) 08/14/2022   Low, to start oral replacement   Essential hypertension BP Readings from Last 3 Encounters:  05/09/23 118/70  05/09/23 134/86  04/19/23 124/70   Stable, pt to continue medical treatment toprol xl 100 qd   Cough Mild to mod, for antibx course doxycycline 100 bid, cough med prn, for cxr r/o pna and lab including cbc,  to f/u any worsening symptoms or concerns  Borderline low oxygen saturation level Pt encouraged to go to ED but declines  Wheezing Mild , for inhaler prn, depomedrol I'm 80 mg, prednisone taper,,  to f/u any worsening symptoms or concerns  Followup: Return if symptoms worsen or fail to improve.  Oliver Barre, MD 05/09/2023 7:33 PM [facility]

## 2023-05-10 ENCOUNTER — Ambulatory Visit: Payer: 59

## 2023-05-10 DIAGNOSIS — R062 Wheezing: Secondary | ICD-10-CM

## 2023-05-10 DIAGNOSIS — R051 Acute cough: Secondary | ICD-10-CM

## 2023-05-10 NOTE — Addendum Note (Signed)
Addended by: Corwin Levins on: 05/10/2023 04:26 PM   Modules accepted: Orders

## 2023-05-14 ENCOUNTER — Encounter: Payer: Self-pay | Admitting: Internal Medicine

## 2023-05-16 ENCOUNTER — Other Ambulatory Visit (HOSPITAL_COMMUNITY): Payer: Self-pay

## 2023-05-16 ENCOUNTER — Telehealth: Payer: Self-pay | Admitting: *Deleted

## 2023-05-16 ENCOUNTER — Other Ambulatory Visit: Payer: Self-pay | Admitting: Infectious Diseases

## 2023-05-16 DIAGNOSIS — B20 Human immunodeficiency virus [HIV] disease: Secondary | ICD-10-CM

## 2023-05-16 MED ORDER — CABOTEGRAVIR & RILPIVIRINE ER 600 & 900 MG/3ML IM SUER: 1.0000 | INTRAMUSCULAR | Status: DC

## 2023-05-16 NOTE — Telephone Encounter (Signed)
Call to patient spoke with his sister April to schedule patient for Cabenuva injection.  Patient is scheduled for 05/21/2023 at 3:00 PM.  Patient's sister was asking when patient should be scheduled for a follow up appointment with Dr. Ninetta Lights and lab work.?  Will forward message to Dr. Ninetta Lights to see when patient will need to come in.

## 2023-05-17 ENCOUNTER — Other Ambulatory Visit: Payer: Self-pay | Admitting: Cardiology

## 2023-05-18 ENCOUNTER — Other Ambulatory Visit: Payer: Self-pay | Admitting: Neurology

## 2023-05-18 ENCOUNTER — Ambulatory Visit
Admission: RE | Admit: 2023-05-18 | Discharge: 2023-05-18 | Disposition: A | Discharge status: Home / Self Care | Payer: 59 | Source: Ambulatory Visit | Attending: Pulmonary Disease | Admitting: Pulmonary Disease

## 2023-05-18 DIAGNOSIS — Z87891 Personal history of nicotine dependence: Secondary | ICD-10-CM

## 2023-05-21 ENCOUNTER — Telehealth: Payer: Self-pay | Admitting: *Deleted

## 2023-05-21 ENCOUNTER — Ambulatory Visit: Payer: 59

## 2023-05-21 NOTE — Telephone Encounter (Signed)
RTC from patient's sister April. Informed her that medication has not arrived.  Rescheduled for 06/11/2023 at 3:00 PM.

## 2023-05-22 ENCOUNTER — Telehealth: Payer: Self-pay | Admitting: Internal Medicine

## 2023-05-22 ENCOUNTER — Telehealth: Payer: Self-pay | Admitting: Cardiology

## 2023-05-22 NOTE — Telephone Encounter (Signed)
*  STAT* If patient is at the pharmacy, call can be transferred to refill team.   1. Which medications need to be refilled? (please list name of each medication and dose if known)  metoprolol succinate (TOPROL-XL) 100 MG  2. Which pharmacy/location (including street and city if local pharmacy) is medication to be sent to? [id number] - [place],  - [address] AT [facility] OF [address]  3. Do they need a 30 day or 90 day supply?  90 day supply

## 2023-05-22 NOTE — Telephone Encounter (Signed)
Prescription Request  05/22/2023  LOV: 05/09/2023  What is the name of the medication or equipment? albuterol (VENTOLIN HFA) 108 (90 Base) MCG/ACT inhaler   Have you contacted your pharmacy to request a refill? No   Which pharmacy would you like this sent to?     [id number] - [place], [place] - [address] AT [facility] OF [address] [place] [address] Phone: [phone] Fax: [phone]  Patient notified that their request is being sent to the clinical staff for review and that they should receive a response within 2 business days.   Please advise at Mobile [phone] (mobile)

## 2023-05-23 ENCOUNTER — Other Ambulatory Visit: Payer: Self-pay

## 2023-05-23 ENCOUNTER — Telehealth: Payer: Self-pay | Admitting: Pulmonary Disease

## 2023-05-23 MED ORDER — METOPROLOL SUCCINATE ER 100 MG PO TB24: 100.0000 mg | ORAL_TABLET | Freq: Every day | ORAL | 3 refills | Status: DC

## 2023-05-23 NOTE — Telephone Encounter (Signed)
Or 9-15 am 11/21 (ok to double book) Oretha Milch, MD  to Me  [facility] Triage Pool     05/23/23  2:55 PM  I can see him on 11/22 @ 10-45 am   I called and spoke with the pt's daughter and notified of response per Dr Vassie Loll  Appt set for tomorrow at 9:15 am

## 2023-05-23 NOTE — Telephone Encounter (Signed)
Spoke with the pt's sister  She states that pt c/o wheezing, cough and fatigue over the past wk  He seen PCP and was given pred, doxy and tussionex  He has finished meds and still has prod cough with light yellow sputum  Still has a lot of wheezing  He uses breztri, albuterol singulair  No openings unfortunately  Please advise, thanks!  Allergies  Allergen Reactions   Amoxapine Shortness Of Breath   Chocolate Shortness Of Breath and Other (See Comments)    Asthma attack, welts   Ciprofloxacin Anaphylaxis, Dermatitis, Itching, Nausea And Vomiting, Shortness Of Breath, Swelling and Hives   Descovy [Emtricitabine-Tenofovir Af] Shortness Of Breath    Nasuea, vomiiting, diarrhea, sob, whelps, throat closes   Doxycycline Hyclate Shortness Of Breath and Swelling   Genvoya [Elviteg-Cobic-Emtricit-Tenofaf] Shortness Of Breath    Nausea, vomitting, diarreha, sob, rash, throat closing   Morphine And Codeine Shortness Of Breath, Itching and Other (See Comments)     chest pain   Other Anaphylaxis   Oxycodone-Acetaminophen Shortness Of Breath   Penicillins Shortness Of Breath and Rash    Has patient had a PCN reaction causing immediate rash, facial/tongue/throat swelling, SOB or lightheadedness with hypotension: Yes Has patient had a PCN reaction causing severe rash involving mucus membranes or skin necrosis: No Has patient had a PCN reaction that required hospitalization No Has patient had a PCN reaction occurring within the last 10 years: Yes If all of the above answers are "NO", then may proceed with Cephalosporin use.   Sulfa Antibiotics Anaphylaxis and Other (See Comments)   Tomato Shortness Of Breath   Vancomycin Shortness Of Breath and Other (See Comments)   Darunavir Itching   Amlodipine Other (See Comments)    swelling   Carvedilol     LEG SWELLING, DIZZNESS   Flagyl [Metronidazole] Itching   Meloxicam    Milk-Related Compounds    Morphine Other (See Comments)   Oxycodone Hcl  Other (See Comments)   Penicillin G Other (See Comments)   Toradol [Ketorolac Tromethamine] Nausea And Vomiting    itching   Acetaminophen Rash and Other (See Comments)    Pt tolerates percocet and also plain APAP without rash   Tramadol Itching, Nausea And Vomiting and Other (See Comments)

## 2023-05-23 NOTE — Telephone Encounter (Signed)
Refill already sent on 05/09/23, Pt must contact pharmacy for refill.

## 2023-05-23 NOTE — Telephone Encounter (Signed)
 RX sent to requested Pharmacy

## 2023-05-23 NOTE — Telephone Encounter (Signed)
Patient has wheezing and sluggish. He was just seen by his pcp and they advised for him to get in contact with pulmonary care. He tested negative for Covid, RSV and the Flu. Please call patient's sister back at [phone]

## 2023-05-24 ENCOUNTER — Encounter (HOSPITAL_BASED_OUTPATIENT_CLINIC_OR_DEPARTMENT_OTHER): Payer: Self-pay | Admitting: Pulmonary Disease

## 2023-05-24 ENCOUNTER — Ambulatory Visit (HOSPITAL_BASED_OUTPATIENT_CLINIC_OR_DEPARTMENT_OTHER): Payer: 59 | Admitting: Pulmonary Disease

## 2023-05-24 DIAGNOSIS — J441 Chronic obstructive pulmonary disease with (acute) exacerbation: Secondary | ICD-10-CM | POA: Diagnosis not present

## 2023-05-24 MED ORDER — CEPHALEXIN 500 MG PO CAPS: 500.0000 mg | ORAL_CAPSULE | Freq: Three times a day (TID) | ORAL | 0 refills | Status: DC

## 2023-05-24 MED ORDER — HYDROCOD POLI-CHLORPHE POLI ER 10-8 MG/5ML PO SUER: 5.0000 mL | Freq: Two times a day (BID) | ORAL | 0 refills | Status: DC | PRN

## 2023-05-24 NOTE — Assessment & Plan Note (Addendum)
Keflex 500 twice daily x 7days He just completed a course of prednisone and would like to avoid steroid further Take albuterol nebs twice daily until wheezing subsides Cleared for endoscopy tomorrow if you are feeling better

## 2023-05-24 NOTE — Progress Notes (Signed)
   Subjective:    Patient ID: Casey Brennan, male    DOB: [birth year], 62 y.o.   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   HIV - diagnosed 73 ,last CD 4 479 03/2020 Viral meningits He smoked more than 30 pack years before he quit in 2012 -schatzki ring - dilated    Acute OV -accompanied by sister who corroborates history     pt c/o wheezing, cough and fatigue over the past wk  He seen PCP and was given pred, doxy and tussionex  He has finished meds and still has prod cough with light yellow sputum  Still has a lot of wheezing   He underwent shoulder and neck surgery this year.  He has gained weight due to prednisone.  He has an EGD planned for tomorrow. Multiple allergies are noted CXR 11/7 clear LDCT chest to my review shows clear lung fields   Significant tests/ events reviewed   10/2022 CT c/A/P >> unchanged nodule, 3 mm right upper lobe cystic bronchiectasis, unchanged -2 subcentimeter left iliac lytic lesions -unchanged 02/26/22 CTA chest >> RUL 4mm nodule, likely LN CT cors 04/2020 ca score 0     PFT 01/2023 moderate airway obstruction, ratio 59, FEV1 1.41/40%, FVC 51%, positive bronchodilator response, DLCO 81%   spirometry 07/22/2014  FEV1  1.12 (34%) ratio 59  AEC 900   Home sleep test 07/2021 mild, AHI 8/hour, low sat 88%, worse when supine  Review of Systems neg for any significant sore throat, dysphagia, itching, sneezing, nasal congestion or excess/ purulent secretions, fever, chills, sweats, unintended wt loss, pleuritic or exertional cp, hempoptysis, orthopnea pnd or change in chronic leg swelling. Also denies presyncope, palpitations, heartburn, abdominal pain, nausea, vomiting, diarrhea or change in bowel or urinary habits, dysuria,hematuria, rash, arthralgias, visual complaints,  headache, numbness weakness or ataxia.      Objective:   Physical Exam  Gen. Pleasant, obese, in no distress ENT - no lesions, no post nasal drip Neck: No JVD, no thyromegaly, no carotid bruits Lungs: no use of accessory muscles, no dullness to percussion, decreased without rales or rhonchi  Cardiovascular: Rhythm regular, heart sounds  normal, no murmurs or gallops, no peripheral edema Musculoskeletal: No deformities, no cyanosis or clubbing , no tremors       Assessment & Plan:    Allergies to multiple antibiotics - X Consider making appt with allergist Dr Dellis Anes for PCN testing & desensitization

## 2023-05-24 NOTE — Patient Instructions (Signed)
X Keflex 500 twice daily x 7days  Take albuterol nebs twice daily until wheezing subsides Cleared for endoscopy tomorrow if you are feeling better  X Consider making appt with allergist Dr Dellis Anes for PCN testing & desensitization

## 2023-05-25 ENCOUNTER — Ambulatory Visit: Payer: 59 | Admitting: Internal Medicine

## 2023-05-25 ENCOUNTER — Other Ambulatory Visit (HOSPITAL_COMMUNITY): Payer: Self-pay

## 2023-05-25 ENCOUNTER — Other Ambulatory Visit: Payer: Self-pay | Admitting: Infectious Diseases

## 2023-05-25 ENCOUNTER — Encounter: Payer: Self-pay | Admitting: Internal Medicine

## 2023-05-25 VITALS — BP 141/73 | HR 73 | Temp 97.8°F | Resp 20 | Ht 69.0 in | Wt 219.0 lb

## 2023-05-25 DIAGNOSIS — K253 Acute gastric ulcer without hemorrhage or perforation: Secondary | ICD-10-CM

## 2023-05-25 DIAGNOSIS — K222 Esophageal obstruction: Secondary | ICD-10-CM

## 2023-05-25 DIAGNOSIS — K2100 Gastro-esophageal reflux disease with esophagitis, without bleeding: Secondary | ICD-10-CM

## 2023-05-25 DIAGNOSIS — R1310 Dysphagia, unspecified: Secondary | ICD-10-CM | POA: Diagnosis not present

## 2023-05-25 DIAGNOSIS — B20 Human immunodeficiency virus [HIV] disease: Secondary | ICD-10-CM

## 2023-05-25 MED ORDER — CABOTEGRAVIR & RILPIVIRINE ER 600 & 900 MG/3ML IM SUER: 1.0000 | INTRAMUSCULAR | Status: DC

## 2023-05-25 MED ORDER — SODIUM CHLORIDE 0.9 % IV SOLN: 500.0000 mL | Freq: Once | INTRAVENOUS | Status: DC

## 2023-05-25 NOTE — Progress Notes (Signed)
Updated medical record.

## 2023-05-25 NOTE — Op Note (Signed)
[facility] Patient Name: Casey Brennan Procedure Date: 05/25/2023 9:20 AM MRN: [id number] Endoscopist: Beverley Fiedler , MD, [id number] Age: 62 Referring MD:  Date of Birth: 08-Aug-1960 Gender: Male Account #: [id number] Procedure:                Upper GI endoscopy Indications:              Dysphagia, Gastro-esophageal reflux disease,                            history of gastric ulcer (last seen and biopsy neg                            for dysplasia and H. Pylori on 01/18/2023) Medicines:                Monitored Anesthesia Care Procedure:                Pre-Anesthesia Assessment:                           - Prior to the procedure, a History and Physical                            was performed, and patient medications and                            allergies were reviewed. The patient's tolerance of                            previous anesthesia was also reviewed. The risks                            and benefits of the procedure and the sedation                            options and risks were discussed with the patient.                            All questions were answered, and informed consent                            was obtained. Prior Anticoagulants: The patient has                            taken no anticoagulant or antiplatelet agents. ASA                            Grade Assessment: II - A patient with mild systemic                            disease. After reviewing the risks and benefits,                            the patient was deemed in satisfactory condition to  undergo the procedure.                           After obtaining informed consent, the endoscope was                            passed under direct vision. Throughout the                            procedure, the patient's blood pressure, pulse, and                            oxygen saturations were monitored continuously. The                            Olympus Scope  [id number] was introduced through the                            mouth, and advanced to the second part of duodenum.                            The upper GI endoscopy was accomplished without                            difficulty. The patient tolerated the procedure                            well. Scope In: Scope Out: Findings:                 Two benign-appearing, intrinsic moderate                            (circumferential scarring or stenosis; an endoscope                            may pass) stenoses were found 39 to 40 cm from the                            incisors. The narrowest stenosis measured 1.4 cm                            (inner diameter) x 1 cm (in length). The stenoses                            were traversed. A TTS dilator was passed through                            the scope. Dilation with an 18-19-20 mm balloon                            dilator was performed to 20 mm. The dilation site  was examined and showed mild mucosal disruption.                           A large amount of food (residue) was found in the                            gastric body and in the gastric antrum.                           There is no endoscopic evidence of ulceration in                            the gastric antrum.                           The examined duodenum was normal. Complications:            No immediate complications. Estimated Blood Loss:     Estimated blood loss was minimal. Impression:               - Benign-appearing esophageal stenoses. Dilated to                            20 mm with balloon.                           - A large amount of food (residue) in the stomach.                            This interferes with visualization of the gastric                            mucosa (pylorus widely patent)                           - Normal examined duodenum.                           - No specimens collected. Recommendation:           - Patient has  a contact number available for                            emergencies. The signs and symptoms of potential                            delayed complications were discussed with the                            patient. Return to normal activities tomorrow.                            Written discharge instructions were provided to the                            patient.                           -  Resume previous diet.                           - Continue present medications.                           - Finding support diagnosis of gastroparesis. Beverley Fiedler, MD 05/25/2023 10:25:43 AM This report has been signed electronically.

## 2023-05-25 NOTE — Patient Instructions (Addendum)
Continue present medications. Finding support diagnosis of gastroparesis. Follow dilation diet- see handout!  Stop Carafate Follow gastroparesis diet- see handout!!   YOU HAD AN ENDOSCOPIC PROCEDURE TODAY AT THE [facility]:   Refer to the procedure report that was given to you for any specific questions about what was found during the examination.  If the procedure report does not answer your questions, please call your gastroenterologist to clarify.  If you requested that your care partner not be given the details of your procedure findings, then the procedure report has been included in a sealed envelope for you to review at your convenience later.  YOU SHOULD EXPECT: Some feelings of bloating in the abdomen. Passage of more gas than usual.  Walking can help get rid of the air that was put into your GI tract during the procedure and reduce the bloating. If you had a lower endoscopy (such as a colonoscopy or flexible sigmoidoscopy) you may notice spotting of blood in your stool or on the toilet paper. If you underwent a bowel prep for your procedure, you may not have a normal bowel movement for a few days.  Please Note:  You might notice some irritation and congestion in your nose or some drainage.  This is from the oxygen used during your procedure.  There is no need for concern and it should clear up in a day or so.  SYMPTOMS TO REPORT IMMEDIATELY:  Following upper endoscopy (EGD)  Vomiting of blood or coffee ground material  New chest pain or pain under the shoulder blades  Painful or persistently difficult swallowing  New shortness of breath  Fever of 100F or higher  Black, tarry-looking stools  For urgent or emergent issues, a gastroenterologist can be reached at any hour by calling [phone]. Do not use MyChart messaging for urgent concerns.    DIET:  Follow dilation diet! Drink plenty of fluids but you should avoid alcoholic beverages for 24 hours.  ACTIVITY:   You should plan to take it easy for the rest of today and you should NOT DRIVE or use heavy machinery until tomorrow (because of the sedation medicines used during the test).    FOLLOW UP: Our staff will call the number listed on your records the next business day following your procedure.  We will call around 7:15- 8:00 am to check on you and address any questions or concerns that you may have regarding the information given to you following your procedure. If we do not reach you, we will leave a message.      SIGNATURES/CONFIDENTIALITY: You and/or your care partner have signed paperwork which will be entered into your electronic medical record.  These signatures attest to the fact that that the information above on your After Visit Summary has been reviewed and is understood.  Full responsibility of the confidentiality of this discharge information lies with you and/or your care-partner.

## 2023-05-25 NOTE — Progress Notes (Signed)
GASTROENTEROLOGY PROCEDURE H&P NOTE   Primary Care Physician: Corwin Levins, MD    Reason for Procedure:  GERD with dysphagia and history of gastric ulcer  Plan:    EGD the and probable dilation  Patient is appropriate for endoscopic procedure(s) in the ambulatory ([facility]) setting.  The nature of the procedure, as well as the risks, benefits, and alternatives were carefully and thoroughly reviewed with the patient. Ample time for discussion and questions allowed. The patient understood, was satisfied, and agreed to proceed.     HPI: Casey Brennan is a 62 y.o. male who presents for EGD.  Medical history as below. No recent chest pain or shortness of breath.  No abdominal pain today.  Past Medical History:  Diagnosis Date   Asthma    very rare   Chronic anemia    normocytic   Chronic folliculitis    Chronic kidney disease    Stage 2   COPD (chronic obstructive pulmonary disease) (HCC)    Dyspnea    Environmental and seasonal allergies    Glaucoma    right   H/O pericarditis    2010--  myopercarditis--  resolved   Headache(784.0)    HX SEVERE FRONTAL HA'S   Hiatal hernia    History of gastric ulcer    nonbleeding gastric ulcer 58//24   History of kidney stones    History of MRSA infection 2010   infected boil   HIV (human immunodeficiency virus infection) (HCC) 1988   Hypertension    Hypertensive cardiomyopathy (HCC)    EF 37%, normal coronaries 04/2020 CCTA; LVEF 60-65% 03/2022 echo   Lytic bone lesion of hip    WORK-UP DONE BY ONCOLOGIST DR HA --  NOT MALIGNANT   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   Pneumonia    hx of   Post concussion syndrome    resolved   Prostate cancer (HCC) 04/25/2013   gleason 3+3=6, volume 30 gm   Viral meningitis    06/2021    Past Surgical History:  Procedure Laterality Date   ACDCF  09/29/2020   ANTERIOR CERVICAL DECOMPRESSION/DISCECTOMY FUSION 4 LEVELS N/A 09/29/2020   Procedure: ANTERIOR CERVICAL DECOMPRESSION FUSION  CERVICAL 3-CERVICAL 4 , CERVICAL 4 - CERVICAL 5, CERVICAL 5 - CERVICAL 6 WITH INSTRUMENTATION AND ALLOGRAFT;  Surgeon: Estill Bamberg, MD;  Location: [facility];  Service: Orthopedics;  Laterality: N/A;   CARDIAC CATHETERIZATION  03-23-2009  DR COOPER   NORMAL CORONARY ARTERIES   CHOLECYSTECTOMY N/A 11/06/2014   Procedure: LAPAROSCOPIC CHOLECYSTECTOMY WITH INTRAOPERATIVE CHOLANGIOGRAM;  Surgeon: Jimmye Norman, MD;  Location: [facility] OR;  Service: General;  Laterality: N/A;   COLONOSCOPY  12/26/2011   Procedure: COLONOSCOPY;  Surgeon: Shirley Friar, MD;  Location: [facility]COPY;  Service: Endoscopy;  Laterality: N/A;   COLONOSCOPY     COLONOSCOPY WITH PROPOFOL N/A 07/29/2014   Procedure: COLONOSCOPY WITH PROPOFOL;  Surgeon: Shirley Friar, MD;  Location: [facility] ENDOSCOPY;  Service: Endoscopy;  Laterality: N/A;   CYSTOSCOPY N/A 05/18/2018   Procedure: CYSTOSCOPY REMOVAL FOREIGN BODY;  Surgeon: Ihor Gully, MD;  Location: [facility] ORS;  Service: Urology;  Laterality: N/A;   CYSTOSCOPY/RETROGRADE/URETEROSCOPY Bilateral 04/25/2013   Procedure: CYSTOSCOPY/ BILATERAL RETROGRADES; BLADDER BIOPSIES;  Surgeon: Sebastian Ache, MD;  Location: [facility];  Service: Urology;  Laterality: Bilateral;   DENTAL EXAMINATION UNDER ANESTHESIA     ESOPHAGOGASTRODUODENOSCOPY  12/26/2011   Procedure: ESOPHAGOGASTRODUODENOSCOPY (EGD);  Surgeon: Shirley Friar, MD;  Location: [facility];  Service: Endoscopy;  Laterality: N/A;   ESOPHAGOGASTRODUODENOSCOPY (EGD) WITH PROPOFOL N/A 07/29/2014   Procedure: ESOPHAGOGASTRODUODENOSCOPY (EGD) WITH PROPOFOL;  Surgeon: Shirley Friar, MD;  Location: [facility] ENDOSCOPY;  Service: Endoscopy;  Laterality: N/A;   EXCISION CHRONIC LEFT BREAST ABSCESS  09-21-2010   EXCISIONAL BX LEFT BREAST MASS/  I  &  D LEFT BREAST ABSCESS  03-24-2009   KNEE ARTHROSCOPY Right 1985   left axilla biopsy  04/2013   left hip biopsy  10/2012   LYMPHADENECTOMY Bilateral 08/18/2013   Procedure:  LYMPHADENECTOMY "PELVIC LYMPH NODE DISSECTION";  Surgeon: Sebastian Ache, MD;  Location: [facility];  Service: Urology;  Laterality: Bilateral;   POSTERIOR CERVICAL FUSION/FORAMINOTOMY N/A 11/30/2022   Procedure: POSTERIOR DECOMPRESSION AND FUSION CERVICAL 4- CERVICAL 5, CERVICAL 5- CERVICAL 6, CERVICAL 6- CERVICAL 7, CERVICAL 7- THORACIC 1 WITH INSTRUMENTATION AND ALLOGRAFT;  Surgeon: Estill Bamberg, MD;  Location: [facility];  Service: Orthopedics;  Laterality: N/A;   PROSTATE BIOPSY N/A 04/25/2013   Procedure: BIOPSY TRANSRECTAL ULTRASONIC PROSTATE (TUBP);  Surgeon: Sebastian Ache, MD;  Location: [facility];  Service: Urology;  Laterality: N/A;   ROBOT ASSISTED LAPAROSCOPIC RADICAL PROSTATECTOMY N/A 08/18/2013   Procedure: ROBOTIC ASSISTED LAPAROSCOPIC RADICAL PROSTATECTOMY;  Surgeon: Sebastian Ache, MD;  Location: [facility];  Service: Urology;  Laterality: N/A;   SHOULDER ARTHROSCOPY WITH ROTATOR CUFF REPAIR AND SUBACROMIAL DECOMPRESSION Left 03/13/2023   Procedure: SHOULDER ARTHROSCOPY WITH ROTATOR CUFF REPAIR AND SUBACROMIAL DECOMPRESSION, DISTAL CLAVICLECTOMY, BICEPS TENODESIS;  Surgeon: Sheral Apley, MD;  Location: [facility];  Service: Orthopedics;  Laterality: Left;   TOTAL KNEE ARTHROPLASTY Right 08/01/2016   08/17/16   TOTAL KNEE ARTHROPLASTY Right 08/01/2016   Procedure: TOTAL KNEE ARTHROPLASTY;  Surgeon: Sheral Apley, MD;  Location: [facility] OR;  Service: Orthopedics;  Laterality: Right;   UPPER GASTROINTESTINAL ENDOSCOPY     UPPER LEG SOFT TISSUE BIOPSY Left 2012   lthigh    Prior to Admission medications   Medication Sig Start Date End Date Taking? Authorizing Provider  albuterol (PROVENTIL) (2.5 MG/3ML) 0.083% nebulizer solution Take 3 mLs (2.5 mg total) by nebulization every 6 (six) hours as needed for wheezing or shortness of breath. 01/25/22  Yes Ginnie Smart, MD  albuterol (VENTOLIN HFA) 108 (90 Base) MCG/ACT inhaler Inhale 2 puffs into the lungs every  6 (six) hours as needed for wheezing or shortness of breath. 05/09/23  Yes Corwin Levins, MD  ALPRAZolam Prudy Feeler) 0.5 MG tablet TAKE 1 TABLET(0.5 MG) BY MOUTH TWICE DAILY AS NEEDED FOR SLEEP 01/01/23  Yes Dohmeier, Porfirio Mylar, MD  BELBUCA 300 MCG FILM SMARTSIG:1 Strip(s) By Mouth Every 12 Hours 05/21/23  Yes [provider]  BREZTRI AEROSPHERE 160-9-4.8 MCG/ACT AERO INHALE 2 PUFFS INTO THE LUNGS IN THE MORNING AND AT BEDTIME 03/29/23  Yes Oretha Milch, MD  cephALEXin (KEFLEX) 500 MG capsule Take 1 capsule (500 mg total) by mouth 3 (three) times daily. 05/24/23  Yes Oretha Milch, MD  cetirizine (ZYRTEC) 10 MG tablet TAKE 1 TABLET(10 MG) BY MOUTH DAILY 03/22/23  Yes Corwin Levins, MD  chlorpheniramine-HYDROcodone (TUSSIONEX) 10-8 MG/5ML Take 5 mLs by mouth every 12 (twelve) hours as needed for cough. 05/24/23  Yes Oretha Milch, MD  Cholecalciferol (VITAMIN D3) 250 MCG (10000 UT) capsule Take 1 tablet by mouth daily. Nature's Made 04/14/21  Yes [provider]  dorzolamide (TRUSOPT) 2 % ophthalmic solution Place 1 drop into both eyes 2 (two) times daily. 06/07/22  Yes [provider]  famotidine (PEPCID)  40 MG tablet Take 1 tablet (40 mg total) by mouth at bedtime. 04/19/23  Yes Unk Lightning, PA  gabapentin (NEURONTIN) 300 MG capsule TAKE 1 CAPSULE(300 MG) BY MOUTH FOUR TIMES DAILY AS NEEDED FOR PAIN 01/01/23  Yes Dohmeier, Porfirio Mylar, MD  Glucos-Chond-Hyal Ac-Ca Fructo (MOVE FREE JOINT HEALTH ADVANCE PO) Take 1 tablet by mouth daily.   Yes [provider]  hydrOXYzine (ATARAX) 50 MG tablet TAKE 1 TABLET(50 MG) BY MOUTH TWICE DAILY 03/22/23  Yes Patwardhan, Manish J, MD  methocarbamol (ROBAXIN) 500 MG tablet Take 1-2 tablets (500-1,000 mg total) by mouth every 6 (six) hours as needed for muscle spasms. 12/01/22  Yes Estill Bamberg, MD  metoprolol succinate (TOPROL-XL) 100 MG 24 hr tablet Take 1 tablet (100 mg total) by mouth daily. Please call office to schedule an appt  for further refills. Thank you 05/23/23  Yes Patwardhan, Manish J, MD  montelukast (SINGULAIR) 10 MG tablet Take 10 mg by mouth daily. 11/15/20  Yes [provider]  Multiple Vitamin (MULTIVITAMIN WITH MINERALS) TABS tablet Take 1 tablet by mouth daily after breakfast.    Yes [provider]  nitroGLYCERIN (NITROSTAT) 0.4 MG SL tablet Place 1 tablet (0.4 mg total) under the tongue every 5 (five) minutes as needed for chest pain. 02/08/22 05/25/23 Yes Patwardhan, Manish J, MD  OVER THE COUNTER MEDICATION Take 2 capsules by mouth daily. Zhou: thyroid supplement   Yes [provider]  pantoprazole (PROTONIX) 40 MG tablet Take 1 tablet (40 mg total) by mouth 2 (two) times daily before a meal. 11/08/22  Yes Iva Boop, MD  QNASL 80 MCG/ACT AERS USE 2 SPRAYS IN [facility] NOSTRIL DAILY Patient taking differently: Place 2 sprays into both nostrils in the morning and at bedtime. 11/16/22  Yes Corwin Levins, MD  QULIPTA 30 MG TABS TAKE 1 TABLET(30 MG) BY MOUTH DAILY 05/21/23  Yes Dohmeier, Porfirio Mylar, MD  rosuvastatin (CRESTOR) 20 MG tablet TAKE 1 TABLET(20 MG) BY MOUTH DAILY Patient taking differently: Take 20 mg by mouth daily. 08/28/22  Yes Corwin Levins, MD  tizanidine (ZANAFLEX) 6 MG capsule Take 6 mg by mouth 3 (three) times daily.   Yes [provider]  Ubrogepant (UBRELVY) 100 MG TABS Take 1 tablet (100 mg total) by mouth as needed (take 1 tablet at onset of migraine. may take an additional tablet 2 hrs later if needed (Do not exceed more than 2 tablets in 24 hour period)). 03/22/23  Yes Dohmeier, Porfirio Mylar, MD  azelastine (ASTELIN) 0.1 % nasal spray Place 2 sprays into both nostrils in the morning. Use in each nostril as directed    [provider]  diphenhydrAMINE (BENADRYL) 25 MG tablet Take 1 tablet (25 mg total) by mouth every 6 (six) hours as needed. Patient taking differently: Take 25 mg by mouth every 6 (six) hours as needed for sleep, itching or allergies. 04/11/21    Hyman Hopes, Margaux, PA-C  EPINEPHrine 0.3 mg/0.3 mL IJ SOAJ injection Inject 0.3 mg into the muscle as needed for anaphylaxis. 03/24/22   Corwin Levins, MD  meclizine (ANTIVERT) 25 MG tablet Take 1 tablet (25 mg total) by mouth daily as needed. 11/11/21   Corwin Levins, MD  mupirocin ointment (BACTROBAN) 2 % APPLY EXTERNALLY TO THE AFFECTED AREA TWICE DAILY FOR 3 WEEKS, PLACE COTTON BETWEEN TOES TO ALLOW BETTER AERATION AS NEEDED Patient taking differently: Apply 1 application  topically daily as needed (affected toes). 04/20/21   Ginnie Smart, MD  ondansetron (  ZOFRAN-ODT) 4 MG disintegrating tablet Take 1 tablet (4 mg total) by mouth every 8 (eight) hours as needed for nausea or vomiting. Patient taking differently: Take 4 mg by mouth every 4 (four) hours as needed for nausea or vomiting. 07/17/22   Pincus Sanes, MD  torsemide (DEMADEX) 20 MG tablet Take 10 mg by mouth See admin instructions. Tuesday Thursday Saturday    [provider]  zolpidem (AMBIEN) 5 MG tablet Take 1-2 tablets (5-10 mg total) by mouth at bedtime as needed for sleep. Patient not taking: Reported on 04/23/2019 12/21/16 05/19/19  Kathlynn Grate, DO    Current Outpatient Medications  Medication Sig Dispense Refill   albuterol (PROVENTIL) (2.5 MG/3ML) 0.083% nebulizer solution Take 3 mLs (2.5 mg total) by nebulization every 6 (six) hours as needed for wheezing or shortness of breath. 75 mL 3   albuterol (VENTOLIN HFA) 108 (90 Base) MCG/ACT inhaler Inhale 2 puffs into the lungs every 6 (six) hours as needed for wheezing or shortness of breath. 36 g 3   ALPRAZolam (XANAX) 0.5 MG tablet TAKE 1 TABLET(0.5 MG) BY MOUTH TWICE DAILY AS NEEDED FOR SLEEP 60 tablet 5   BELBUCA 300 MCG FILM SMARTSIG:1 Strip(s) By Mouth Every 12 Hours     BREZTRI AEROSPHERE 160-9-4.8 MCG/ACT AERO INHALE 2 PUFFS INTO THE LUNGS IN THE MORNING AND AT BEDTIME 10.7 g 3   cephALEXin (KEFLEX) 500 MG capsule Take 1 capsule (500 mg total) by mouth 3  (three) times daily. 21 capsule 0   cetirizine (ZYRTEC) 10 MG tablet TAKE 1 TABLET(10 MG) BY MOUTH DAILY 90 tablet 3   chlorpheniramine-HYDROcodone (TUSSIONEX) 10-8 MG/5ML Take 5 mLs by mouth every 12 (twelve) hours as needed for cough. 120 mL 0   Cholecalciferol (VITAMIN D3) 250 MCG (10000 UT) capsule Take 1 tablet by mouth daily. Nature's Made     dorzolamide (TRUSOPT) 2 % ophthalmic solution Place 1 drop into both eyes 2 (two) times daily.     famotidine (PEPCID) 40 MG tablet Take 1 tablet (40 mg total) by mouth at bedtime. 90 tablet 3   gabapentin (NEURONTIN) 300 MG capsule TAKE 1 CAPSULE(300 MG) BY MOUTH FOUR TIMES DAILY AS NEEDED FOR PAIN 120 capsule 4   Glucos-Chond-Hyal Ac-Ca Fructo (MOVE FREE JOINT HEALTH ADVANCE PO) Take 1 tablet by mouth daily.     hydrOXYzine (ATARAX) 50 MG tablet TAKE 1 TABLET(50 MG) BY MOUTH TWICE DAILY 60 tablet 1   methocarbamol (ROBAXIN) 500 MG tablet Take 1-2 tablets (500-1,000 mg total) by mouth every 6 (six) hours as needed for muscle spasms. 30 tablet 2   metoprolol succinate (TOPROL-XL) 100 MG 24 hr tablet Take 1 tablet (100 mg total) by mouth daily. Please call office to schedule an appt for further refills. Thank you 30 tablet 3   montelukast (SINGULAIR) 10 MG tablet Take 10 mg by mouth daily.     Multiple Vitamin (MULTIVITAMIN WITH MINERALS) TABS tablet Take 1 tablet by mouth daily after breakfast.      nitroGLYCERIN (NITROSTAT) 0.4 MG SL tablet Place 1 tablet (0.4 mg total) under the tongue every 5 (five) minutes as needed for chest pain. 30 tablet 1   OVER THE COUNTER MEDICATION Take 2 capsules by mouth daily. Zhou: thyroid supplement     pantoprazole (PROTONIX) 40 MG tablet Take 1 tablet (40 mg total) by mouth 2 (two) times daily before a meal. 180 tablet 3   QNASL 80 MCG/ACT AERS USE 2 SPRAYS IN EACH NOSTRIL DAILY (Patient  taking differently: Place 2 sprays into both nostrils in the morning and at bedtime.) 10.6 g 2   QULIPTA 30 MG TABS TAKE 1 TABLET(30  MG) BY MOUTH DAILY 30 tablet 5   rosuvastatin (CRESTOR) 20 MG tablet TAKE 1 TABLET(20 MG) BY MOUTH DAILY (Patient taking differently: Take 20 mg by mouth daily.) 90 tablet 3   tizanidine (ZANAFLEX) 6 MG capsule Take 6 mg by mouth 3 (three) times daily.     Ubrogepant (UBRELVY) 100 MG TABS Take 1 tablet (100 mg total) by mouth as needed (take 1 tablet at onset of migraine. may take an additional tablet 2 hrs later if needed (Do not exceed more than 2 tablets in 24 hour period)). 16 tablet 5   azelastine (ASTELIN) 0.1 % nasal spray Place 2 sprays into both nostrils in the morning. Use in each nostril as directed     diphenhydrAMINE (BENADRYL) 25 MG tablet Take 1 tablet (25 mg total) by mouth every 6 (six) hours as needed. (Patient taking differently: Take 25 mg by mouth every 6 (six) hours as needed for sleep, itching or allergies.) 30 tablet 0   EPINEPHrine 0.3 mg/0.3 mL IJ SOAJ injection Inject 0.3 mg into the muscle as needed for anaphylaxis. 1 each 5   meclizine (ANTIVERT) 25 MG tablet Take 1 tablet (25 mg total) by mouth daily as needed. 40 tablet 5   mupirocin ointment (BACTROBAN) 2 % APPLY EXTERNALLY TO THE AFFECTED AREA TWICE DAILY FOR 3 WEEKS, PLACE COTTON BETWEEN TOES TO ALLOW BETTER AERATION AS NEEDED (Patient taking differently: Apply 1 application  topically daily as needed (affected toes).) 66 g 1   ondansetron (ZOFRAN-ODT) 4 MG disintegrating tablet Take 1 tablet (4 mg total) by mouth every 8 (eight) hours as needed for nausea or vomiting. (Patient taking differently: Take 4 mg by mouth every 4 (four) hours as needed for nausea or vomiting.) 30 tablet 2   torsemide (DEMADEX) 20 MG tablet Take 10 mg by mouth See admin instructions. Tuesday Thursday Saturday     Current Facility-Administered Medications  Medication Dose Route Frequency Provider Last Rate Last Admin   0.9 %  sodium chloride infusion  500 mL Intravenous Once Habeeb Puertas, Carie Caddy, MD       [START ON 07/09/2023] cabotegravir &  rilpivirine ER (CABENUVA) 600 & 900 MG/3ML injection 1 kit  1 kit Intramuscular Q2 months        [START ON 06/08/2023] cabotegravir & rilpivirine ER (CABENUVA) 600 & 900 MG/3ML injection 1 kit  1 kit Intramuscular Q30 days         Allergies as of 05/25/2023 - Review Complete 05/25/2023  Allergen Reaction Noted   Amoxapine Shortness Of Breath 03/06/2018   Chocolate Shortness Of Breath and Other (See Comments) 05/22/2016   Ciprofloxacin Anaphylaxis, Dermatitis, Itching, Nausea And Vomiting, Shortness Of Breath, Swelling, and Hives 09/13/2021   Descovy [emtricitabine-tenofovir af] Shortness Of Breath 08/31/2017   Doxycycline hyclate Shortness Of Breath and Swelling 08/04/2022   Genvoya [elviteg-cobic-emtricit-tenofaf] Shortness Of Breath 08/31/2017   Morphine and codeine Shortness Of Breath, Itching, and Other (See Comments) 06/13/2013   Other Anaphylaxis 06/09/2022   Oxycodone-acetaminophen Shortness Of Breath 11/11/2020   Penicillins Shortness Of Breath and Rash    Sulfa antibiotics Anaphylaxis and Other (See Comments) 10/17/2015   Tomato Shortness Of Breath 05/22/2016   Vancomycin Shortness Of Breath and Other (See Comments) 09/25/2016   Darunavir Itching 04/03/2021   Amlodipine Other (See Comments) 11/24/2021   Carvedilol  04/08/2020   Flagyl [  metronidazole] Itching 09/25/2016   Meloxicam  11/22/2022   Milk-related compounds  05/18/2019   Morphine Other (See Comments) 11/16/2020   Oxycodone hcl Other (See Comments) 10/17/2022   Penicillin g Other (See Comments) 11/16/2020   Toradol [ketorolac tromethamine] Nausea And Vomiting 11/12/2017   Acetaminophen Rash and Other (See Comments) 11/11/2014   Tramadol Itching, Nausea And Vomiting, and Other (See Comments) 07/28/2014    Family History  Problem Relation Age of Onset   Leukemia Mother    Stroke Father    Diabetes Father    Cancer Father        brain cancer   Asthma Father    Hypertension Sister    Asthma Sister    Prostate  cancer Maternal Uncle    Colon cancer Neg Hx    Colon polyps Neg Hx    Esophageal cancer Neg Hx    Stomach cancer Neg Hx    Rectal cancer Neg Hx     Social History   Socioeconomic History   Marital status: Divorced    Spouse name: Not on file   Number of children: 1   Years of education: College   Highest education level: Bachelor's degree (e.g., BA, AB, BS)  Occupational History   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 declined condoms  Other Topics Concern   Not on file  Social History Narrative   Sister April stays with him currently although he hopes to return to truck driving. 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 Sister is starting medical school next year.   Patient is divorced and lives with his sister.   Patient has one adult son.   Patient is part-time, driving a truck.   Patient has a college education.   Patient is right-handed.   Patient drinks 2-3 cups of coffee and a 4-5 cups of tea.   Social Determinants of Health   Financial Resource Strain: Medium Risk (03/27/2023)   Overall Financial Resource Strain (CARDIA)    Difficulty of Paying Living Expenses: Somewhat hard  Food Insecurity: Food Insecurity Present (03/27/2023)   Hunger Vital Sign    Worried About Running Out of Food in the Last Year: Sometimes true    Ran Out of Food in the Last Year:  Sometimes true  Transportation Needs: No Transportation Needs (03/27/2023)   PRAPARE - Administrator, Civil Service (Medical): No    Lack of Transportation (Non-Medical): No  Physical Activity: Insufficiently Active (03/27/2023)   Exercise Vital Sign    Days of Exercise per Week: 2 days    Minutes of Exercise per Session: 10 min  Stress: No Stress Concern Present (03/27/2023)   [employer] of Occupational Health - Occupational Stress Questionnaire    Feeling of  Stress : Not at all  Social Connections: Moderately Integrated (03/27/2023)   Social Connection and Isolation Panel [NHANES]    Frequency of Communication with Friends and Family: More than three times a week    Frequency of Social Gatherings with Friends and Family: Twice a week    Attends Religious Services: More than 4 times per year    Active Member of [employer] or Organizations: Yes    Attends Engineer, structural: More than 4 times per year    Marital Status: Divorced  Intimate Partner Violence: Not At Risk (03/30/2023)   Humiliation, Afraid, Rape, and Kick questionnaire    Fear of Current or Ex-Partner: No    Emotionally Abused: No    Physically Abused: No    Sexually Abused: No    Physical Exam: Vital signs in last 24 hours: @BP  132/78   Pulse 72   Temp 97.8 F (36.6 C) (Temporal)   Ht 5\' 9"  (1.753 m)   Wt 219 lb (99.3 kg)   SpO2 96%   BMI 32.34 kg/m  GEN: NAD EYE: Sclerae anicteric ENT: MMM CV: Non-tachycardic Pulm: CTA b/l GI: Soft, NT/ND NEURO:  Alert & Oriented x 3   Erick Blinks, MD [facility]  05/25/2023 9:56 AM

## 2023-05-25 NOTE — Progress Notes (Signed)
Sedate, gd SR, tolerated procedure well, VSS, report to RN 

## 2023-06-06 ENCOUNTER — Other Ambulatory Visit (HOSPITAL_COMMUNITY): Payer: Self-pay

## 2023-06-07 ENCOUNTER — Other Ambulatory Visit: Payer: Self-pay

## 2023-06-07 ENCOUNTER — Encounter (HOSPITAL_COMMUNITY): Payer: Self-pay

## 2023-06-07 ENCOUNTER — Other Ambulatory Visit (HOSPITAL_COMMUNITY): Payer: Self-pay

## 2023-06-07 ENCOUNTER — Other Ambulatory Visit: Payer: Self-pay | Admitting: Infectious Diseases

## 2023-06-07 ENCOUNTER — Telehealth: Payer: Self-pay | Admitting: *Deleted

## 2023-06-07 DIAGNOSIS — B20 Human immunodeficiency virus [HIV] disease: Secondary | ICD-10-CM

## 2023-06-07 MED ORDER — CABOTEGRAVIR & RILPIVIRINE ER 600 & 900 MG/3ML IM SUER
1.0000 | INTRAMUSCULAR | Status: AC
  Administered 2023-06-11: 1 via INTRAMUSCULAR

## 2023-06-07 MED ORDER — CABENUVA 600 & 900 MG/3ML IM SUER
INTRAMUSCULAR | 0 refills | Status: DC
  Filled 2023-06-07: qty 6, 30d supply, fill #0

## 2023-06-07 MED ORDER — CABOTEGRAVIR & RILPIVIRINE ER 600 & 900 MG/3ML IM SUER
1.0000 | INTRAMUSCULAR | Status: DC
  Administered 2023-11-12: 1 via INTRAMUSCULAR

## 2023-06-07 NOTE — Progress Notes (Signed)
Specialty Pharmacy Initial Fill Coordination Note  Casey Brennan is a 62 y.o. male contacted today regarding initial fill of specialty medication(s) Cabotegravir & Rilpivirine   Patient requested Courier to Provider Office   Delivery date: 06/11/23   Verified address: [facility]   Medication will be filled on 12/6.   Patient is aware of $0 copayment.

## 2023-06-08 ENCOUNTER — Telehealth: Payer: Self-pay | Admitting: *Deleted

## 2023-06-08 ENCOUNTER — Other Ambulatory Visit: Payer: Self-pay

## 2023-06-08 ENCOUNTER — Other Ambulatory Visit (HOSPITAL_COMMUNITY): Payer: Self-pay

## 2023-06-08 ENCOUNTER — Other Ambulatory Visit: Payer: Self-pay | Admitting: Infectious Diseases

## 2023-06-08 DIAGNOSIS — Z79899 Other long term (current) drug therapy: Secondary | ICD-10-CM

## 2023-06-08 DIAGNOSIS — Z113 Encounter for screening for infections with a predominantly sexual mode of transmission: Secondary | ICD-10-CM

## 2023-06-08 DIAGNOSIS — B20 Human immunodeficiency virus [HIV] disease: Secondary | ICD-10-CM

## 2023-06-08 NOTE — Telephone Encounter (Signed)
Received a call from pt's sister - stated pt is up for Disability recertification and he needs updated labs (ie cd4,hgb,viral load,etc). LOV 10/31/22. She wanting to know if labs can be done on Monday when pt comes for Cabenuva injection.

## 2023-06-11 ENCOUNTER — Other Ambulatory Visit: Payer: 59

## 2023-06-11 ENCOUNTER — Ambulatory Visit: Payer: 59 | Admitting: *Deleted

## 2023-06-11 ENCOUNTER — Other Ambulatory Visit: Payer: Self-pay | Admitting: *Deleted

## 2023-06-11 DIAGNOSIS — Z79899 Other long term (current) drug therapy: Secondary | ICD-10-CM

## 2023-06-11 DIAGNOSIS — B20 Human immunodeficiency virus [HIV] disease: Secondary | ICD-10-CM | POA: Diagnosis not present

## 2023-06-11 NOTE — Progress Notes (Addendum)
    Patient presents for Cabenuva injections   States feeling well   Target date is 12th  of each month   Last injection received 1st injection    Patient is within 7 day window before or after target date for injections   Unable to use VG sites 2/2 body habitus   Rilpivirine 900 mg Given IM RUOQ. Patient tolerated well.   Cabotegravir 600 given IM LUOQ. Patient tolerated well.   Patient waited 10 minutes in clinic following injections without incident.  Next injections are due 07/12/2023   Angelina Ok, RN    .Internal Medicine Attending: I reviewed the findings of the medical professional who conducted the visit. I was present in the office suite and immediately available to provide assistance and direction throughout the time the service was provided.  Ginnie Smart, MD

## 2023-06-12 ENCOUNTER — Telehealth: Payer: Self-pay | Admitting: Internal Medicine

## 2023-06-12 DIAGNOSIS — G8929 Other chronic pain: Secondary | ICD-10-CM

## 2023-06-12 LAB — COMPREHENSIVE METABOLIC PANEL
ALT: 17 [IU]/L (ref 0–44)
AST: 25 [IU]/L (ref 0–40)
Albumin: 4 g/dL (ref 3.8–4.9)
Alkaline Phosphatase: 133 [IU]/L — ABNORMAL HIGH (ref 44–121)
BUN/Creatinine Ratio: 6 — ABNORMAL LOW (ref 10–24)
BUN: 8 mg/dL (ref 8–27)
Bilirubin Total: 1.1 mg/dL (ref 0.0–1.2)
CO2: 23 mmol/L (ref 20–29)
Calcium: 9 mg/dL (ref 8.6–10.2)
Chloride: 102 mmol/L (ref 96–106)
Creatinine, Ser: 1.29 mg/dL — ABNORMAL HIGH (ref 0.76–1.27)
Globulin, Total: 2.7 g/dL (ref 1.5–4.5)
Glucose: 101 mg/dL — ABNORMAL HIGH (ref 70–99)
Potassium: 3.8 mmol/L (ref 3.5–5.2)
Sodium: 138 mmol/L (ref 134–144)
Total Protein: 6.7 g/dL (ref 6.0–8.5)
eGFR: 63 mL/min/{1.73_m2} (ref >59)

## 2023-06-12 LAB — CBC WITH DIFFERENTIAL/PLATELET
Basophils Absolute: 0 10*3/uL (ref 0.0–0.2)
Basos: 1 %
EOS (ABSOLUTE): 0.2 10*3/uL (ref 0.0–0.4)
Eos: 4 %
Hematocrit: 39 % (ref 37.5–51.0)
Hemoglobin: 12.7 g/dL — ABNORMAL LOW (ref 13.0–17.7)
Immature Grans (Abs): 0 10*3/uL (ref 0.0–0.1)
Immature Granulocytes: 0 %
Lymphocytes Absolute: 1.6 10*3/uL (ref 0.7–3.1)
Lymphs: 30 %
MCH: 30 pg (ref 26.6–33.0)
MCHC: 32.6 g/dL (ref 31.5–35.7)
MCV: 92 fL (ref 79–97)
Monocytes Absolute: 0.3 10*3/uL (ref 0.1–0.9)
Monocytes: 6 %
Neutrophils Absolute: 3.1 10*3/uL (ref 1.4–7.0)
Neutrophils: 59 %
Platelets: 185 10*3/uL (ref 150–450)
RBC: 4.24 x10E6/uL (ref 4.14–5.80)
RDW: 13.1 % (ref 11.6–15.4)
WBC: 5.3 10*3/uL (ref 3.4–10.8)

## 2023-06-12 LAB — LIPID PANEL
Chol/HDL Ratio: 2.2 {ratio} (ref 0.0–5.0)
Cholesterol, Total: 123 mg/dL (ref 100–199)
HDL: 57 mg/dL (ref >39)
LDL Chol Calc (NIH): 49 mg/dL (ref 0–99)
Triglycerides: 90 mg/dL (ref 0–149)
VLDL Cholesterol Cal: 17 mg/dL (ref 5–40)

## 2023-06-12 LAB — T-HELPER CELLS (CD4) COUNT (NOT AT ARMC)
CD4 % Helper T Cell: 26 % — ABNORMAL LOW (ref 33–65)
CD4 T Cell Abs: 378 /uL — ABNORMAL LOW (ref 400–1790)

## 2023-06-12 LAB — HIV-1 RNA QUANT-NO REFLEX-BLD
HIV-1 RNA Viral Load Log: 1.845 {Log_copies}/mL
HIV-1 RNA Viral Load: 70 {copies}/mL

## 2023-06-12 NOTE — Telephone Encounter (Signed)
Patient's sister called and said that the patient does want to be referred to pain management:  [facility] - Phone:  [phone]

## 2023-06-12 NOTE — Telephone Encounter (Signed)
Ok sure this can be done ,  thanks

## 2023-06-13 DIAGNOSIS — M7522 Bicipital tendinitis, left shoulder: Secondary | ICD-10-CM | POA: Diagnosis not present

## 2023-06-13 DIAGNOSIS — M25612 Stiffness of left shoulder, not elsewhere classified: Secondary | ICD-10-CM | POA: Diagnosis not present

## 2023-06-13 DIAGNOSIS — S46012D Strain of muscle(s) and tendon(s) of the rotator cuff of left shoulder, subsequent encounter: Secondary | ICD-10-CM | POA: Diagnosis not present

## 2023-06-13 DIAGNOSIS — M6281 Muscle weakness (generalized): Secondary | ICD-10-CM | POA: Diagnosis not present

## 2023-06-13 DIAGNOSIS — G5601 Carpal tunnel syndrome, right upper limb: Secondary | ICD-10-CM | POA: Diagnosis not present

## 2023-06-13 DIAGNOSIS — G5621 Lesion of ulnar nerve, right upper limb: Secondary | ICD-10-CM | POA: Diagnosis not present

## 2023-06-18 DIAGNOSIS — M7522 Bicipital tendinitis, left shoulder: Secondary | ICD-10-CM | POA: Diagnosis not present

## 2023-06-19 ENCOUNTER — Other Ambulatory Visit: Payer: Self-pay

## 2023-06-19 ENCOUNTER — Other Ambulatory Visit (HOSPITAL_COMMUNITY): Payer: Self-pay

## 2023-06-20 DIAGNOSIS — M25612 Stiffness of left shoulder, not elsewhere classified: Secondary | ICD-10-CM | POA: Diagnosis not present

## 2023-06-20 DIAGNOSIS — M47812 Spondylosis without myelopathy or radiculopathy, cervical region: Secondary | ICD-10-CM | POA: Diagnosis not present

## 2023-06-20 DIAGNOSIS — S46012D Strain of muscle(s) and tendon(s) of the rotator cuff of left shoulder, subsequent encounter: Secondary | ICD-10-CM | POA: Diagnosis not present

## 2023-06-20 DIAGNOSIS — M7522 Bicipital tendinitis, left shoulder: Secondary | ICD-10-CM | POA: Diagnosis not present

## 2023-06-20 DIAGNOSIS — M6281 Muscle weakness (generalized): Secondary | ICD-10-CM | POA: Diagnosis not present

## 2023-06-23 ENCOUNTER — Encounter (HOSPITAL_BASED_OUTPATIENT_CLINIC_OR_DEPARTMENT_OTHER): Payer: Self-pay | Admitting: Pulmonary Disease

## 2023-06-25 ENCOUNTER — Other Ambulatory Visit (HOSPITAL_COMMUNITY): Payer: Self-pay

## 2023-06-25 MED ORDER — PREDNISONE 10 MG PO TABS: ORAL_TABLET | ORAL | 0 refills | Status: DC

## 2023-06-25 NOTE — Progress Notes (Signed)
Patient has viewed results and provider notes on mychart.

## 2023-06-26 ENCOUNTER — Other Ambulatory Visit (HOSPITAL_COMMUNITY): Payer: Self-pay

## 2023-06-26 ENCOUNTER — Other Ambulatory Visit (HOSPITAL_COMMUNITY): Payer: Self-pay | Admitting: Pharmacy Technician

## 2023-06-26 ENCOUNTER — Other Ambulatory Visit: Payer: Self-pay

## 2023-06-26 ENCOUNTER — Other Ambulatory Visit: Payer: Self-pay | Admitting: Infectious Diseases

## 2023-06-26 DIAGNOSIS — R1310 Dysphagia, unspecified: Secondary | ICD-10-CM | POA: Diagnosis not present

## 2023-06-26 DIAGNOSIS — R059 Cough, unspecified: Secondary | ICD-10-CM | POA: Diagnosis not present

## 2023-06-26 DIAGNOSIS — R638 Other symptoms and signs concerning food and fluid intake: Secondary | ICD-10-CM | POA: Diagnosis not present

## 2023-06-26 DIAGNOSIS — R1314 Dysphagia, pharyngoesophageal phase: Secondary | ICD-10-CM | POA: Diagnosis not present

## 2023-06-26 NOTE — Progress Notes (Signed)
Specialty Pharmacy Refill Coordination Note  Casey Brennan is a 62 y.o. male contacted today regarding refills of specialty medication(s) Cabotegravir & Rilpivirine [name])   Patient requested Courier to Provider Office   Delivery date: 07/09/23   Verified address: No data recorded  Medication will be filled on 07/06/23.     Spoke with sister & states patient will be getting this inject at [facility] [address] [place], [place] [address]. But this was last sent to [facility]. [facility] is aware & will follow up on this on 06/28/23

## 2023-06-28 ENCOUNTER — Other Ambulatory Visit (HOSPITAL_COMMUNITY): Payer: Self-pay

## 2023-06-28 ENCOUNTER — Other Ambulatory Visit: Payer: Self-pay

## 2023-06-28 NOTE — Progress Notes (Signed)
Per Camille-Dr. Hatcher's patients will be going through infusion clinic. Okay to send

## 2023-07-02 ENCOUNTER — Other Ambulatory Visit (HOSPITAL_COMMUNITY): Payer: Self-pay

## 2023-07-02 MED ORDER — CABENUVA 600 & 900 MG/3ML IM SUER
INTRAMUSCULAR | 0 refills | Status: DC
  Filled 2023-07-02: qty 6, 30d supply, fill #0

## 2023-07-03 ENCOUNTER — Other Ambulatory Visit (HOSPITAL_COMMUNITY): Payer: Self-pay

## 2023-07-03 ENCOUNTER — Other Ambulatory Visit: Payer: Self-pay

## 2023-07-06 ENCOUNTER — Other Ambulatory Visit (HOSPITAL_COMMUNITY): Payer: Self-pay

## 2023-07-11 ENCOUNTER — Other Ambulatory Visit: Payer: Self-pay | Admitting: Cardiology

## 2023-07-11 DIAGNOSIS — S46012D Strain of muscle(s) and tendon(s) of the rotator cuff of left shoulder, subsequent encounter: Secondary | ICD-10-CM | POA: Diagnosis not present

## 2023-07-11 DIAGNOSIS — G44229 Chronic tension-type headache, not intractable: Secondary | ICD-10-CM

## 2023-07-11 DIAGNOSIS — G250 Essential tremor: Secondary | ICD-10-CM

## 2023-07-11 DIAGNOSIS — M6281 Muscle weakness (generalized): Secondary | ICD-10-CM | POA: Diagnosis not present

## 2023-07-11 DIAGNOSIS — B20 Human immunodeficiency virus [HIV] disease: Secondary | ICD-10-CM

## 2023-07-11 DIAGNOSIS — R252 Cramp and spasm: Secondary | ICD-10-CM

## 2023-07-11 DIAGNOSIS — M25612 Stiffness of left shoulder, not elsewhere classified: Secondary | ICD-10-CM | POA: Diagnosis not present

## 2023-07-11 DIAGNOSIS — M7522 Bicipital tendinitis, left shoulder: Secondary | ICD-10-CM | POA: Diagnosis not present

## 2023-07-12 ENCOUNTER — Telehealth: Payer: Self-pay | Admitting: *Deleted

## 2023-07-12 ENCOUNTER — Other Ambulatory Visit (HOSPITAL_COMMUNITY): Payer: Self-pay

## 2023-07-12 ENCOUNTER — Other Ambulatory Visit: Payer: Self-pay

## 2023-07-12 NOTE — Telephone Encounter (Signed)
 Call earlier today from patient's sister that patient has not been called to set up [facility] appointment [facility] [address].  Call to the [facility] no appointment has been made and was informed that they do not do Cabenova injections.  Call to [facility] to see where the medication was sent .  Medication was sent by [facility] and received at the [facility].  Call to [facility] stated no medication was received.  Call to [facility] no medication was received.  Call to [facility] no medication was received.   Call to the [facility] several times and Pharmacist there was helpful in trying to locate the medication.  Call from [place] this afternoon medication was indeed located at the [facility] site and will be picked up and brought to the Clinics for the injection .  Through the days calls the Infusion site at [facility] has not been trained to do the injection and the patient was due to have the injection done on today.  Call to patient's sister  to inform her that the [facility] has been located and will arrive by Tuesday.  Will call patient to inform him of so that appointment can be made for the injection in the Clinics.

## 2023-07-16 ENCOUNTER — Telehealth: Payer: Self-pay | Admitting: *Deleted

## 2023-07-16 ENCOUNTER — Other Ambulatory Visit: Payer: Self-pay

## 2023-07-18 ENCOUNTER — Telehealth: Payer: Self-pay | Admitting: Internal Medicine

## 2023-07-18 ENCOUNTER — Ambulatory Visit (INDEPENDENT_AMBULATORY_CARE_PROVIDER_SITE_OTHER): Payer: 59

## 2023-07-18 ENCOUNTER — Ambulatory Visit (INDEPENDENT_AMBULATORY_CARE_PROVIDER_SITE_OTHER): Payer: 59 | Admitting: Internal Medicine

## 2023-07-18 ENCOUNTER — Encounter: Payer: Self-pay | Admitting: Internal Medicine

## 2023-07-18 VITALS — BP 124/88 | HR 76 | Temp 98.7°F | Ht 69.0 in | Wt 228.0 lb

## 2023-07-18 DIAGNOSIS — R062 Wheezing: Secondary | ICD-10-CM | POA: Diagnosis not present

## 2023-07-18 DIAGNOSIS — R059 Cough, unspecified: Secondary | ICD-10-CM | POA: Diagnosis not present

## 2023-07-18 DIAGNOSIS — R052 Subacute cough: Secondary | ICD-10-CM

## 2023-07-18 DIAGNOSIS — J449 Chronic obstructive pulmonary disease, unspecified: Secondary | ICD-10-CM

## 2023-07-18 DIAGNOSIS — R0602 Shortness of breath: Secondary | ICD-10-CM | POA: Diagnosis not present

## 2023-07-18 MED ORDER — CEPHALEXIN 500 MG PO CAPS: 500.0000 mg | ORAL_CAPSULE | Freq: Two times a day (BID) | ORAL | 0 refills | Status: AC

## 2023-07-18 MED ORDER — PREDNISONE 10 MG PO TABS: ORAL_TABLET | ORAL | 0 refills | Status: DC

## 2023-07-18 NOTE — Telephone Encounter (Signed)
Copied from CRM [id number]. Topic: Clinical - Medication Refill >> Jul 18, 2023 12:00 PM Louie Boston wrote: Most Recent Primary Care Visit:  Provider: [facility]  Department: [facility]  Visit Type: LAB 30  Date: 06/11/2023  Medication: Tussionex Cough Syrup  Has the patient contacted their pharmacy? No (Agent: If no, request that the patient contact the pharmacy for the refill. If patient does not wish to contact the pharmacy document the reason why and proceed with request.) (Agent: If yes, when and what did the pharmacy advise?)  Patient is requesting Tussionex Cough Syrup prescription to get filled at pharmacy. This medication is not currently on patients list of medications. Please send request to pharmacy for patient.  Is this the correct pharmacy for this prescription? Yes If no, delete pharmacy and type the correct one.  This is the patient's preferred pharmacy:  [id number] - [place], [place] - [address] AT [facility] OF [address]  [place] [address] Phone: [phone] Fax: [phone]   Has the prescription been filled recently? No  Is the patient out of the medication? Yes  Has the patient been seen for an appointment in the last year OR does the patient have an upcoming appointment? Yes  Can we respond through MyChart? Yes  Agent: Please be advised that Rx refills may take up to 3 business days. We ask that you follow-up with your pharmacy.

## 2023-07-18 NOTE — Patient Instructions (Signed)
 We have sent in the keflex  to take for the antibiotic and the prednisone  to take for the wheezing.   We will check the chest x-ray today as well

## 2023-07-18 NOTE — Progress Notes (Signed)
   Subjective:   Patient ID: Casey Brennan, male    DOB: 22-Mar-1961, 63 y.o.   MRN: [id number]  HPI The patient is a 63 YO man coming in for cold symptoms. 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 Symptoms ongoing since November and slightly waxing and waning. Worse in last 2 weeks. Took steroid course from pulmonary around christmas to end of first week January which improved symptoms slightly. He endorses he has not worsened since stopping steroids. Taking breztri  and using inhalers. They hear wheezing at home at times. Coughing and sometimes SOB not all the time.   Review of Systems  Constitutional: Negative.   HENT:  Positive for congestion.   Eyes: Negative.   Respiratory:  Positive for cough, shortness of breath and wheezing. Negative for chest tightness.   Cardiovascular:  Negative for chest pain, palpitations and leg swelling.  Gastrointestinal:  Negative for abdominal distention, abdominal pain, constipation, diarrhea, nausea and vomiting.  Musculoskeletal:  Positive for arthralgias.  Skin: Negative.   Neurological: Negative.   Psychiatric/Behavioral: Negative.      Objective:  Physical Exam Constitutional:      Appearance: He is well-developed.  HENT:     Head: Normocephalic and atraumatic.  Cardiovascular:     Rate and Rhythm: Normal rate and regular rhythm.  Pulmonary:     Effort: Pulmonary effort is normal. No respiratory distress.     Breath sounds: Normal breath sounds. No wheezing or rales.     Comments: No wheezing heard on exam, air movement decent Abdominal:     General: Bowel sounds are normal. There is no distension.     Palpations: Abdomen is soft.     Tenderness: There is no abdominal tenderness. There is no rebound.  Musculoskeletal:     Cervical back: Normal range of motion.  Skin:    General: Skin is warm and dry.  Neurological:     Mental Status: He is alert and oriented to person, place, and time.     Coordination: Coordination abnormal.      Comments: cane     Vitals:   07/18/23 1108  BP: 124/88  Pulse: 76  Temp: 98.7 F (37.1 C)  TempSrc: Oral  SpO2: 98%  Weight: 228 lb (103.4 kg)  Height: 5\' 9"  (1.753 m)    Assessment & Plan:  Visit time 25 minutes in face to face communication with patient and coordination of care, additional 5 minutes spent in record review, coordination or care, ordering tests, communicating/referring to other healthcare professionals, documenting in medical records all on the same day of the visit for total time 30 minutes spent on the visit.

## 2023-07-18 NOTE — Assessment & Plan Note (Signed)
 Given severe asthmatic COPD on PFTs will treat as flare. Rx keflex  (has taken in past without reaction) and prednisone . It is a little hard to tell from history if patient is at baseline or worse than baseline. Seems to had had more coughing since November. Reviewed CT chest Dec 2024 which is stable from prior without new nodules, pneumonia, fluid. Checking CXR today. They are requesting tussionex cough medicine. I am not comfortable with this given his allergy to oxycodone  (SOB) and tramadol  (nausea/vomiting/itching). I offer promethazine /dm cough syrup which they did not want as they think he took 2011 and did not do well with (perhaps nausea/vomiting). I could find that he has been given promethazine  for nausea vomiting since. I offered tessalon  perles which they declined stating he had had in past but did not help him much. We discussed covid/flu/rsv testing which they were initially asking for. Since symptoms are ongoing for 2 weeks this will not change management and we are able to do testing but it is unlikely it will be positive so they do not want.

## 2023-07-19 ENCOUNTER — Telehealth: Payer: Self-pay | Admitting: *Deleted

## 2023-07-19 ENCOUNTER — Ambulatory Visit: Payer: 59

## 2023-07-19 NOTE — Telephone Encounter (Signed)
This patient's Charlene Brooke was located and sent to [place] on Monday.  Are we to do it here in the Clinics or is he to go to the [facility]fusion [facility]?  Problem is is that he was due 07/12/2023 for his 2nd injection.  Took [place] a while to locate the Medication and to contact the sister to schedule an appointment.   Just need a little direction as to what needs to be done.

## 2023-07-20 ENCOUNTER — Ambulatory Visit: Payer: 59 | Admitting: *Deleted

## 2023-07-20 ENCOUNTER — Telehealth: Payer: Self-pay | Admitting: *Deleted

## 2023-07-20 DIAGNOSIS — Z21 Asymptomatic human immunodeficiency virus [HIV] infection status: Secondary | ICD-10-CM

## 2023-07-20 MED ORDER — CABOTEGRAVIR & RILPIVIRINE ER 600 & 900 MG/3ML IM SUER
1.0000 | Freq: Once | INTRAMUSCULAR | Status: AC
  Administered 2023-07-20: 1 via INTRAMUSCULAR

## 2023-07-20 NOTE — Progress Notes (Unsigned)
    Patient presents for Cabenuva injections   States feeling well   Target date is 9th  of each month   Last injection received 06/11/2023   Patient is within 7 day window before or after target date for injections   Unable to use VG sites 2/2 body habitus   Rilpivirine 900 mg Given IM RUOQ. Patient tolerated well.   Cabotegravir 600 given IM LUOQ. Patient tolerated well.   Patient waited 10 minutes in clinic following injections without incident.  Next injections are due 09/09/2023    ***

## 2023-07-20 NOTE — Telephone Encounter (Signed)
Patient here for Cabenuva injection.  Tolerated well is 1 week and 1 day beyond next target date.  Given per ok of Dr. Ninetta Lights.  Will need to call patient with next date due. Also patient c/o of a cough yellow to light brown mucous.  Yellow mucous coming from nose. Temperatures 99.7.  Patient and sister said that they had spoken to the PCP who asked them to mention to Dr. Ninetta Lights.  Patient stated that he does do a lot of coughing at night that does wake him up.  Patient also mentioned being tired for the last couple of weeks.   Patient mentioned that he got a prescription for Tussinex in the past that helped with the cough.

## 2023-07-24 ENCOUNTER — Encounter: Payer: Self-pay | Admitting: Internal Medicine

## 2023-07-27 ENCOUNTER — Other Ambulatory Visit: Payer: Self-pay

## 2023-07-30 DIAGNOSIS — M47812 Spondylosis without myelopathy or radiculopathy, cervical region: Secondary | ICD-10-CM | POA: Diagnosis not present

## 2023-07-31 ENCOUNTER — Telehealth: Payer: Self-pay | Admitting: Internal Medicine

## 2023-07-31 ENCOUNTER — Ambulatory Visit: Payer: 59 | Admitting: Internal Medicine

## 2023-07-31 MED ORDER — METOCLOPRAMIDE HCL 5 MG PO TABS: 5.0000 mg | ORAL_TABLET | Freq: Three times a day (TID) | ORAL | 3 refills | Status: DC

## 2023-07-31 NOTE — Telephone Encounter (Signed)
Sister states pt had a procedure yesterday and is running a fever today. She states he could not come in for the appt today. Pts appt rescheduled to see Dr. Rhea Belton 09/19/23 at 8:50am. Pts sister also wanted to know if Dr. Rhea Belton wants [name] to go ahead and start taking Reglan. Please advise.

## 2023-07-31 NOTE — Telephone Encounter (Signed)
Patients sister called requesting to speak with a nurse regarding the patient not feeling well and not being able to attend appt for today.

## 2023-07-31 NOTE — Telephone Encounter (Signed)
If he is having gastroparesis symptoms despite gastroparesis diet (epigastric pain, nausea, vomiting, early fullness, bloating) he can try reglan 5 mg TID-AC and HS PRN Small risk of neurologic side effect such as tremor or unsteadiness.  If this happens he should stop it and let us know He should keep his March appt for continuity [facility]

## 2023-07-31 NOTE — Telephone Encounter (Signed)
Pt is having gastroparesis symptoms, script sent to the pharmacy.

## 2023-08-03 ENCOUNTER — Other Ambulatory Visit: Payer: Self-pay

## 2023-08-03 ENCOUNTER — Encounter (HOSPITAL_BASED_OUTPATIENT_CLINIC_OR_DEPARTMENT_OTHER): Payer: Self-pay | Admitting: Orthopedic Surgery

## 2023-08-03 NOTE — Progress Notes (Signed)
Chart reviewed with Dr Nance Pew due to difficult airway being noted on chart. We reviewed several remote and recent anesthesia records and there were no issues with airway. Patient's daughter states that they have never been told that patient has this. OK for [facility].

## 2023-08-07 ENCOUNTER — Encounter (HOSPITAL_BASED_OUTPATIENT_CLINIC_OR_DEPARTMENT_OTHER)
Admission: RE | Admit: 2023-08-07 | Discharge: 2023-08-07 | Disposition: A | Discharge status: Home / Self Care | Payer: 59 | Source: Ambulatory Visit | Attending: Orthopedic Surgery | Admitting: Orthopedic Surgery

## 2023-08-07 DIAGNOSIS — Z01812 Encounter for preprocedural laboratory examination: Secondary | ICD-10-CM | POA: Insufficient documentation

## 2023-08-07 LAB — BASIC METABOLIC PANEL
Anion gap: 10 (ref 5–15)
BUN: 8 mg/dL (ref 6–20)
CO2: 23 mmol/L (ref 22–32)
Calcium: 9.2 mg/dL (ref 8.9–10.3)
Chloride: 104 mmol/L (ref 98–111)
Creatinine, Ser: 1.41 mg/dL — ABNORMAL HIGH (ref 0.61–1.24)
GFR, Estimated: 57 mL/min — ABNORMAL LOW (ref >60)
Glucose, Bld: 121 mg/dL — ABNORMAL HIGH (ref 70–99)
Potassium: 3.8 mmol/L (ref 3.5–5.1)
Sodium: 137 mmol/L (ref 135–145)

## 2023-08-07 NOTE — Progress Notes (Signed)
 Enhanced Recovery after Surgery  Enhanced Recovery after Surgery is a protocol used to improve the stress on your body and your recovery after surgery.  Patient Instructions  The night before surgery:  No food after midnight. ONLY clear liquids after midnight  The day of surgery (if you do NOT have diabetes):  Drink ONE (1) Pre-Surgery Clear Ensure as directed.   This drink was given to you during your hospital  pre-op appointment visit. The pre-op nurse will instruct you on the time to drink the  Pre-Surgery Ensure depending on your surgery time. Finish the drink at the designated time by the pre-op nurse.  Nothing else to drink after completing the  Pre-Surgery Clear Ensure.  The day of surgery (if you have diabetes): Drink ONE (1) Gatorade 2 (G2) as directed. This drink was given to you during your hospital  pre-op appointment visit.  The pre-op nurse will instruct you on the time to drink the   Gatorade 2 (G2) depending on your surgery time. Color of the Gatorade may vary. Red is not allowed. Nothing else to drink after completing the  Gatorade 2 (G2).         If office.you have questions, please contact your surgeon's office

## 2023-08-08 ENCOUNTER — Other Ambulatory Visit: Payer: Self-pay | Admitting: Orthopedic Surgery

## 2023-08-09 ENCOUNTER — Encounter (HOSPITAL_BASED_OUTPATIENT_CLINIC_OR_DEPARTMENT_OTHER): Payer: Self-pay | Admitting: Orthopedic Surgery

## 2023-08-09 ENCOUNTER — Ambulatory Visit (HOSPITAL_BASED_OUTPATIENT_CLINIC_OR_DEPARTMENT_OTHER)
Admission: RE | Admit: 2023-08-09 | Discharge: 2023-08-09 | Disposition: A | Discharge status: Home / Self Care | Payer: 59 | Source: Ambulatory Visit | Attending: Orthopedic Surgery | Admitting: Orthopedic Surgery

## 2023-08-09 ENCOUNTER — Ambulatory Visit (HOSPITAL_BASED_OUTPATIENT_CLINIC_OR_DEPARTMENT_OTHER): Payer: 59 | Admitting: Anesthesiology

## 2023-08-09 ENCOUNTER — Encounter (HOSPITAL_BASED_OUTPATIENT_CLINIC_OR_DEPARTMENT_OTHER)
Admission: RE | Disposition: A | Discharge status: Home / Self Care | Payer: Self-pay | Source: Ambulatory Visit | Attending: Orthopedic Surgery

## 2023-08-09 ENCOUNTER — Other Ambulatory Visit: Payer: Self-pay

## 2023-08-09 DIAGNOSIS — Z8661 Personal history of infections of the central nervous system: Secondary | ICD-10-CM | POA: Diagnosis not present

## 2023-08-09 DIAGNOSIS — Z8711 Personal history of peptic ulcer disease: Secondary | ICD-10-CM | POA: Insufficient documentation

## 2023-08-09 DIAGNOSIS — I110 Hypertensive heart disease with heart failure: Secondary | ICD-10-CM

## 2023-08-09 DIAGNOSIS — J4489 Other specified chronic obstructive pulmonary disease: Secondary | ICD-10-CM | POA: Insufficient documentation

## 2023-08-09 DIAGNOSIS — Z79899 Other long term (current) drug therapy: Secondary | ICD-10-CM | POA: Diagnosis not present

## 2023-08-09 DIAGNOSIS — I739 Peripheral vascular disease, unspecified: Secondary | ICD-10-CM | POA: Diagnosis not present

## 2023-08-09 DIAGNOSIS — Z87891 Personal history of nicotine dependence: Secondary | ICD-10-CM | POA: Insufficient documentation

## 2023-08-09 DIAGNOSIS — Z8546 Personal history of malignant neoplasm of prostate: Secondary | ICD-10-CM | POA: Insufficient documentation

## 2023-08-09 DIAGNOSIS — I509 Heart failure, unspecified: Secondary | ICD-10-CM | POA: Insufficient documentation

## 2023-08-09 DIAGNOSIS — G5601 Carpal tunnel syndrome, right upper limb: Secondary | ICD-10-CM | POA: Diagnosis not present

## 2023-08-09 DIAGNOSIS — Z21 Asymptomatic human immunodeficiency virus [HIV] infection status: Secondary | ICD-10-CM | POA: Diagnosis not present

## 2023-08-09 DIAGNOSIS — N182 Chronic kidney disease, stage 2 (mild): Secondary | ICD-10-CM | POA: Insufficient documentation

## 2023-08-09 DIAGNOSIS — K219 Gastro-esophageal reflux disease without esophagitis: Secondary | ICD-10-CM | POA: Insufficient documentation

## 2023-08-09 DIAGNOSIS — I119 Hypertensive heart disease without heart failure: Secondary | ICD-10-CM

## 2023-08-09 DIAGNOSIS — G5621 Lesion of ulnar nerve, right upper limb: Secondary | ICD-10-CM

## 2023-08-09 DIAGNOSIS — K449 Diaphragmatic hernia without obstruction or gangrene: Secondary | ICD-10-CM | POA: Insufficient documentation

## 2023-08-09 DIAGNOSIS — I130 Hypertensive heart and chronic kidney disease with heart failure and stage 1 through stage 4 chronic kidney disease, or unspecified chronic kidney disease: Secondary | ICD-10-CM | POA: Diagnosis not present

## 2023-08-09 HISTORY — PX: ULNAR NERVE TRANSPOSITION: SHX2595

## 2023-08-09 HISTORY — PX: CARPAL TUNNEL RELEASE: SHX101

## 2023-08-09 SURGERY — CARPAL TUNNEL RELEASE
Anesthesia: General | Site: Hand | Laterality: Right

## 2023-08-09 MED ORDER — BUPIVACAINE HCL (PF) 0.25 % IJ SOLN
INTRAMUSCULAR | Status: DC | PRN
  Administered 2023-08-09: 10 mL

## 2023-08-09 MED ORDER — EPHEDRINE 5 MG/ML INJ
INTRAVENOUS | Status: AC
  Filled 2023-08-09: qty 5

## 2023-08-09 MED ORDER — ONDANSETRON HCL 4 MG/2ML IJ SOLN
INTRAMUSCULAR | Status: AC
  Filled 2023-08-09: qty 2

## 2023-08-09 MED ORDER — LACTATED RINGERS IV SOLN: INTRAVENOUS | Status: DC

## 2023-08-09 MED ORDER — LIDOCAINE 2% (20 MG/ML) 5 ML SYRINGE
INTRAMUSCULAR | Status: AC
  Filled 2023-08-09: qty 5

## 2023-08-09 MED ORDER — ACETAMINOPHEN 500 MG PO TABS: 1000.0000 mg | ORAL_TABLET | Freq: Once | ORAL | Status: DC

## 2023-08-09 MED ORDER — FENTANYL CITRATE (PF) 100 MCG/2ML IJ SOLN
INTRAMUSCULAR | Status: DC | PRN
  Administered 2023-08-09: 100 ug via INTRAVENOUS

## 2023-08-09 MED ORDER — DEXAMETHASONE SODIUM PHOSPHATE 10 MG/ML IJ SOLN
INTRAMUSCULAR | Status: AC
  Filled 2023-08-09: qty 1

## 2023-08-09 MED ORDER — HYDROCODONE-ACETAMINOPHEN 5-325 MG PO TABS
ORAL_TABLET | ORAL | Status: AC
  Filled 2023-08-09: qty 1

## 2023-08-09 MED ORDER — SUCCINYLCHOLINE CHLORIDE 200 MG/10ML IV SOSY
PREFILLED_SYRINGE | INTRAVENOUS | Status: AC
  Filled 2023-08-09: qty 10

## 2023-08-09 MED ORDER — DEXAMETHASONE SODIUM PHOSPHATE 4 MG/ML IJ SOLN
INTRAMUSCULAR | Status: DC | PRN
  Administered 2023-08-09: 5 mg via INTRAVENOUS

## 2023-08-09 MED ORDER — GABAPENTIN 300 MG PO CAPS: 300.0000 mg | ORAL_CAPSULE | Freq: Once | ORAL | Status: DC

## 2023-08-09 MED ORDER — FENTANYL CITRATE (PF) 100 MCG/2ML IJ SOLN
INTRAMUSCULAR | Status: AC
  Filled 2023-08-09: qty 2

## 2023-08-09 MED ORDER — MIDAZOLAM HCL 5 MG/5ML IJ SOLN
INTRAMUSCULAR | Status: DC | PRN
  Administered 2023-08-09: 1 mg via INTRAVENOUS

## 2023-08-09 MED ORDER — ACETAMINOPHEN 500 MG PO TABS
ORAL_TABLET | ORAL | Status: AC
  Filled 2023-08-09: qty 2

## 2023-08-09 MED ORDER — ONDANSETRON HCL 4 MG/2ML IJ SOLN
INTRAMUSCULAR | Status: DC | PRN
  Administered 2023-08-09: 4 mg via INTRAVENOUS

## 2023-08-09 MED ORDER — PROPOFOL 10 MG/ML IV BOLUS
INTRAVENOUS | Status: DC | PRN
  Administered 2023-08-09: 130 mg via INTRAVENOUS

## 2023-08-09 MED ORDER — 0.9 % SODIUM CHLORIDE (POUR BTL) OPTIME
TOPICAL | Status: DC | PRN
  Administered 2023-08-09: 700 mL

## 2023-08-09 MED ORDER — PHENYLEPHRINE HCL (PRESSORS) 10 MG/ML IV SOLN
INTRAVENOUS | Status: DC | PRN
  Administered 2023-08-09: 80 ug via INTRAVENOUS

## 2023-08-09 MED ORDER — DROPERIDOL 2.5 MG/ML IJ SOLN: 0.6250 mg | Freq: Once | INTRAMUSCULAR | Status: DC | PRN

## 2023-08-09 MED ORDER — LIDOCAINE HCL (CARDIAC) PF 100 MG/5ML IV SOSY
PREFILLED_SYRINGE | INTRAVENOUS | Status: DC | PRN
  Administered 2023-08-09: 40 mg via INTRAVENOUS

## 2023-08-09 MED ORDER — MIDAZOLAM HCL 2 MG/2ML IJ SOLN
INTRAMUSCULAR | Status: AC
  Filled 2023-08-09: qty 2

## 2023-08-09 MED ORDER — SUCCINYLCHOLINE CHLORIDE 200 MG/10ML IV SOSY
PREFILLED_SYRINGE | INTRAVENOUS | Status: DC | PRN
  Administered 2023-08-09: 100 mg via INTRAVENOUS

## 2023-08-09 MED ORDER — PROPOFOL 500 MG/50ML IV EMUL
INTRAVENOUS | Status: AC
  Filled 2023-08-09: qty 100

## 2023-08-09 MED ORDER — HYDROCODONE-ACETAMINOPHEN 5-325 MG PO TABS: ORAL_TABLET | ORAL | 0 refills | Status: AC

## 2023-08-09 MED ORDER — PHENYLEPHRINE 80 MCG/ML (10ML) SYRINGE FOR IV PUSH (FOR BLOOD PRESSURE SUPPORT)
PREFILLED_SYRINGE | INTRAVENOUS | Status: AC
  Filled 2023-08-09: qty 10

## 2023-08-09 MED ORDER — HYDROCODONE-ACETAMINOPHEN 5-325 MG PO TABS
1.0000 | ORAL_TABLET | Freq: Once | ORAL | Status: AC
  Administered 2023-08-09: 1 via ORAL

## 2023-08-09 MED ORDER — FENTANYL CITRATE (PF) 100 MCG/2ML IJ SOLN: 25.0000 ug | INTRAMUSCULAR | Status: DC | PRN

## 2023-08-09 SURGICAL SUPPLY — 44 items
BLADE MINI RND TIP GREEN BEAV (BLADE) IMPLANT
BLADE SURG 15 STRL LF DISP TIS (BLADE) ×6 IMPLANT
BNDG ELASTIC 3INX 5YD STR LF (GAUZE/BANDAGES/DRESSINGS) ×6 IMPLANT
BNDG ELASTIC 4INX 5YD STR LF (GAUZE/BANDAGES/DRESSINGS) ×3 IMPLANT
BNDG ESMARK 4X9 LF (GAUZE/BANDAGES/DRESSINGS) ×3 IMPLANT
BNDG GAUZE DERMACEA FLUFF 4 (GAUZE/BANDAGES/DRESSINGS) ×3 IMPLANT
CHLORAPREP W/TINT 26 (MISCELLANEOUS) ×3 IMPLANT
CLIP TI MEDIUM 6 (CLIP) IMPLANT
CORD BIPOLAR FORCEPS 12FT (ELECTRODE) ×3 IMPLANT
COVER BACK TABLE 60X90IN (DRAPES) ×3 IMPLANT
COVER MAYO STAND STRL (DRAPES) ×3 IMPLANT
CUFF TOURN SGL QUICK 18X3 (MISCELLANEOUS) ×3 IMPLANT
CUFF TOURN SGL QUICK 18X4 (TOURNIQUET CUFF) ×3 IMPLANT
DRAPE EXTREMITY T 121X128X90 (DISPOSABLE) ×3 IMPLANT
DRAPE SURG 17X23 STRL (DRAPES) ×3 IMPLANT
GAUZE 4X4 16PLY ~~LOC~~+RFID DBL (SPONGE) IMPLANT
GAUZE PAD ABD 8X10 STRL (GAUZE/BANDAGES/DRESSINGS) ×3 IMPLANT
GAUZE SPONGE 4X4 12PLY STRL (GAUZE/BANDAGES/DRESSINGS) ×3 IMPLANT
GAUZE XEROFORM 1X8 LF (GAUZE/BANDAGES/DRESSINGS) ×3 IMPLANT
GLOVE BIO SURGEON STRL SZ7.5 (GLOVE) ×3 IMPLANT
GLOVE BIOGEL PI IND STRL 8 (GLOVE) ×3 IMPLANT
GLOVE BIOGEL PI IND STRL 8.5 (GLOVE) ×3 IMPLANT
GLOVE SURG ORTHO 8.0 STRL STRW (GLOVE) ×3 IMPLANT
GOWN STRL REUS W/ TWL LRG LVL3 (GOWN DISPOSABLE) ×3 IMPLANT
GOWN STRL REUS W/TWL XL LVL3 (GOWN DISPOSABLE) ×6 IMPLANT
NDL HYPO 25X1 1.5 SAFETY (NEEDLE) ×3 IMPLANT
NEEDLE HYPO 25X1 1.5 SAFETY (NEEDLE) ×2
NS IRRIG 1000ML POUR BTL (IV SOLUTION) ×3 IMPLANT
PACK BASIN DAY SURGERY FS (CUSTOM PROCEDURE TRAY) ×3 IMPLANT
PAD CAST 3X4 CTTN HI CHSV (CAST SUPPLIES) ×3 IMPLANT
PAD CAST 4YDX4 CTTN HI CHSV (CAST SUPPLIES) ×3 IMPLANT
PADDING CAST ABS COTTON 4X4 ST (CAST SUPPLIES) ×3 IMPLANT
SLEEVE SCD COMPRESS KNEE MED (STOCKING) ×3 IMPLANT
SPIKE FLUID TRANSFER (MISCELLANEOUS) IMPLANT
SPLINT PLASTER CAST FAST 5X30 (CAST SUPPLIES) IMPLANT
SPLINT PLASTER CAST XFAST 3X15 (CAST SUPPLIES) IMPLANT
STOCKINETTE 4X48 STRL (DRAPES) ×3 IMPLANT
SUT ETHILON 4 0 PS 2 18 (SUTURE) ×3 IMPLANT
SUT VIC AB 2-0 SH 27XBRD (SUTURE) ×3 IMPLANT
SUT VIC AB 4-0 PS2 18 (SUTURE) IMPLANT
SYR BULB EAR ULCER 3OZ GRN STR (SYRINGE) ×3 IMPLANT
SYR CONTROL 10ML LL (SYRINGE) ×3 IMPLANT
TOWEL GREEN STERILE FF (TOWEL DISPOSABLE) ×6 IMPLANT
UNDERPAD 30X36 HEAVY ABSORB (UNDERPADS AND DIAPERS) ×3 IMPLANT

## 2023-08-09 NOTE — Anesthesia Postprocedure Evaluation (Signed)
 Anesthesia Post Note  Patient: Casey Brennan  Procedure(s) Performed: RIGHT CARPAL TUNNEL RELEASE (Right: Hand) RIGHT ULNAR NERVE DECOMPRESSION AT ELBOW, POSSIBLE TRANSPOSITION (Right: Elbow)     Patient location during evaluation: PACU Anesthesia Type: General Level of consciousness: awake and alert Pain management: pain level controlled Vital Signs Assessment: post-procedure vital signs reviewed and stable Respiratory status: spontaneous breathing, nonlabored ventilation, respiratory function stable and patient connected to nasal cannula oxygen  Cardiovascular status: blood pressure returned to baseline and stable Postop Assessment: no apparent nausea or vomiting Anesthetic complications: yes   Encounter Notable Events  Notable Event Outcome Phase Comment  Difficult to intubate - expected  Intraprocedure Filed from anesthesia note documentation.    Last Vitals:  Vitals:   08/09/23 1430 08/09/23 1441  BP: (!) 139/92 (!) 150/91  Pulse: 75 72  Resp: 13 16  Temp:  36.9 C  SpO2: 92% 94%    Last Pain:  Vitals:   08/09/23 1500  TempSrc:   PainSc: 5                  Casey Brennan

## 2023-08-09 NOTE — Transfer of Care (Signed)
 Immediate Anesthesia Transfer of Care Note  Patient: Casey Brennan Nephew  Procedure(s) Performed: RIGHT CARPAL TUNNEL RELEASE (Right: Hand) RIGHT ULNAR NERVE DECOMPRESSION AT ELBOW, POSSIBLE TRANSPOSITION (Right: Elbow)  Patient Location: PACU  Anesthesia Type:General  Level of Consciousness: drowsy and patient cooperative  Airway & Oxygen  Therapy: Patient Spontanous Breathing and Patient connected to face mask oxygen   Post-op Assessment: Report given to RN and Post -op Vital signs reviewed and stable  Post vital signs: Reviewed and stable  Last Vitals:  Vitals Value Taken Time  BP 146/92 08/09/23 1335  Temp    Pulse 77 08/09/23 1338  Resp 12 08/09/23 1338  SpO2 100 % 08/09/23 1338  Vitals shown include unfiled device data.  Last Pain:  Vitals:   08/09/23 1005  TempSrc: Temporal  PainSc: 0-No pain         Complications:  Encounter Notable Events  Notable Event Outcome Phase Comment  Difficult to intubate - expected  Intraprocedure Filed from anesthesia note documentation.

## 2023-08-09 NOTE — Discharge Instructions (Addendum)
 Hand Center Instructions Hand Surgery  Wound Care: Keep your hand elevated above the level of your heart.  Do not allow it to dangle by your side.  Keep the dressing dry and do not remove it unless your doctor advises you to do so.  He will usually change it at the time of your post-op visit.  Moving your fingers is advised to stimulate circulation but will depend on the site of your surgery.  If you have a splint applied, your doctor will advise you regarding movement.  Activity: Do not drive or operate machinery today.  Rest today and then you may return to your normal activity and work as indicated by your physician.  Diet:  Drink liquids today or eat a light diet.  You may resume a regular diet tomorrow.    General expectations: Pain for two to three days. Fingers may become slightly swollen.  Call your doctor if any of the following occur: Severe pain not relieved by pain medication. Elevated temperature. Dressing soaked with blood. Inability to move fingers. White or bluish color to fingers.   Post Anesthesia Home Care Instructions  Activity: Get plenty of rest for the remainder of the day. A responsible individual must stay with you for 24 hours following the procedure.  For the next 24 hours, DO NOT: -Drive a car -Advertising copywriter -Drink alcoholic beverages -Take any medication unless instructed by your physician -Make any legal decisions or sign important papers.  Meals: Start with liquid foods such as gelatin or soup. Progress to regular foods as tolerated. Avoid greasy, spicy, heavy foods. If nausea and/or vomiting occur, drink only clear liquids until the nausea and/or vomiting subsides. Call your physician if vomiting continues.  Special Instructions/Symptoms: Your throat may feel dry or sore from the anesthesia or the breathing tube placed in your throat during surgery. If this causes discomfort, gargle with warm salt water. The discomfort should disappear within  24 hours.  If you had a scopolamine patch placed behind your ear for the management of post- operative nausea and/or vomiting:  1. The medication in the patch is effective for 72 hours, after which it should be removed.  Wrap patch in a tissue and discard in the trash. Wash hands thoroughly with soap and water. 2. You may remove the patch earlier than 72 hours if you experience unpleasant side effects which may include dry mouth, dizziness or visual disturbances. 3. Avoid touching the patch. Wash your hands with soap and water after contact with the patch.

## 2023-08-09 NOTE — Op Note (Signed)
 I assisted Surgeons and Role:    * Murrell Drivers, MD - Primary    * Murrell Kuba, MD - Assisting on the Procedure(s): RIGHT CARPAL TUNNEL RELEASE RIGHT ULNAR NERVE DECOMPRESSION AT ELBOW, POSSIBLE TRANSPOSITION on 08/09/2023.  I provided assistance on this case as follows: Set up, approach, identification compression of the median nerve at the wrist with closure of the wound Approach at the elbow, retraction for identification of the ulnar nerve, release of Osborne's fascia, retraction for release of the flexor carpi ulnaris fascia muscle and deep fascia, retraction for release of the brachial fascia, checking for no subluxation to the ulnar nerve following decompression, closure of the wound and application of the dressing.  Electronically signed by: Kuba Murrell, MD Date: 08/09/2023 Time: 1:29 PM

## 2023-08-09 NOTE — H&P (Signed)
 Casey Brennan is an 63 y.o. male.   Chief Complaint: carpal tunnel syndrome, ulnar neuropathy HPI: 63 yo male with right hand numbness and tingling.  Positive nerve conduction studies.  He wishes to have right carpal tunnel release and ulnar nerve decompression and elbow with transposition as necessary.  Allergies:  Allergies  Allergen Reactions   Amoxapine Shortness Of Breath   Chocolate Shortness Of Breath and Other (See Comments)    Asthma attack, welts   Ciprofloxacin  Anaphylaxis, Dermatitis, Itching, Nausea And Vomiting, Shortness Of Breath, Swelling and Hives   Descovy  [Emtricitabine -Tenofovir  Af] Shortness Of Breath    Nasuea, vomiiting, diarrhea, sob, whelps, throat closes   Doxycycline  Hyclate Shortness Of Breath and Swelling   Genvoya  [Elviteg-Cobic-Emtricit-Tenofaf] Shortness Of Breath    Nausea, vomitting, diarreha, sob, rash, throat closing   Morphine  And Codeine Shortness Of Breath, Itching and Other (See Comments)     chest pain   Other Anaphylaxis   Oxycodone -Acetaminophen  Shortness Of Breath   Penicillins Shortness Of Breath and Rash    Has patient had a PCN reaction causing immediate rash, facial/tongue/throat swelling, SOB or lightheadedness with hypotension: Yes Has patient had a PCN reaction causing severe rash involving mucus membranes or skin necrosis: No Has patient had a PCN reaction that required hospitalization No Has patient had a PCN reaction occurring within the last 10 years: Yes If all of the above answers are NO, then may proceed with Cephalosporin use.   Sulfa Antibiotics Anaphylaxis and Other (See Comments)   Tomato Shortness Of Breath   Vancomycin  Shortness Of Breath and Other (See Comments)   Darunavir  Itching   Amlodipine  Other (See Comments)    swelling   Carvedilol      LEG SWELLING, DIZZNESS   Flagyl  [Metronidazole ] Itching   Meloxicam     Milk-Related Compounds    Morphine  Other (See Comments)   Oxycodone  Hcl Other (See Comments)    Penicillin G Other (See Comments)   Toradol  [Ketorolac  Tromethamine ] Nausea And Vomiting    itching   Acetaminophen  Rash and Other (See Comments)    Pt tolerates percocet and also plain APAP without rash   Tramadol  Itching, Nausea And Vomiting and Other (See Comments)    Past Medical History:  Diagnosis Date   Asthma    very rare   Chronic anemia    normocytic   Chronic folliculitis    Chronic kidney disease    Stage 2   COPD (chronic obstructive pulmonary disease) (HCC)    Dyspnea    Environmental and seasonal allergies    Glaucoma    right   H/O pericarditis    2010--  myopercarditis--  resolved   Headache(784.0)    HX SEVERE FRONTAL HA'S   Hiatal hernia    History of gastric ulcer    nonbleeding gastric ulcer 58//24   History of kidney stones    History of MRSA infection 2010   infected boil   HIV (human immunodeficiency virus infection) (HCC) 1988   Hypertension    Hypertensive cardiomyopathy (HCC)    EF 37%, normal coronaries 04/2020 [facility]; LVEF 60-65% 03/2022 echo   Lytic bone lesion of hip    WORK-UP DONE BY ONCOLOGIST DR HA --  NOT MALIGNANT   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   Pneumonia    hx of   Post concussion syndrome    resolved   Prostate cancer (HCC) 04/25/2013   gleason 3+3=6, volume 30 gm   Viral meningitis    06/2021  Past Surgical History:  Procedure Laterality Date   ACDCF  09/29/2020   ANTERIOR CERVICAL DECOMPRESSION/DISCECTOMY FUSION 4 LEVELS N/A 09/29/2020   Procedure: ANTERIOR CERVICAL DECOMPRESSION FUSION CERVICAL 3-CERVICAL 4 , CERVICAL 4 - CERVICAL 5, CERVICAL 5 - CERVICAL 6 WITH INSTRUMENTATION AND ALLOGRAFT;  Surgeon: Beuford Anes, MD;  Location: [facility] OR;  Service: Orthopedics;  Laterality: N/A;   CARDIAC CATHETERIZATION  03-23-2009  DR COOPER   NORMAL CORONARY ARTERIES   CHOLECYSTECTOMY N/A 11/06/2014   Procedure: LAPAROSCOPIC CHOLECYSTECTOMY WITH INTRAOPERATIVE CHOLANGIOGRAM;  Surgeon: Lynwood Pina, MD;  Location: [facility] OR;   Service: General;  Laterality: N/A;   COLONOSCOPY  12/26/2011   Procedure: COLONOSCOPY;  Surgeon: Jerrell KYM Sol, MD;  Location: [facility];  Service: Endoscopy;  Laterality: N/A;   COLONOSCOPY     COLONOSCOPY WITH PROPOFOL  N/A 07/29/2014   Procedure: COLONOSCOPY WITH PROPOFOL ;  Surgeon: Jerrell KYM Sol, MD;  Location: [facility] ENDOSCOPY;  Service: Endoscopy;  Laterality: N/A;   CYSTOSCOPY N/A 05/18/2018   Procedure: CYSTOSCOPY REMOVAL FOREIGN BODY;  Surgeon: Ottelin, Mark, MD;  Location: [facility]S;  Service: Urology;  Laterality: N/A;   CYSTOSCOPY/RETROGRADE/URETEROSCOPY Bilateral 04/25/2013   Procedure: CYSTOSCOPY/ BILATERAL RETROGRADES; BLADDER BIOPSIES;  Surgeon: Ricardo Likens, MD;  Location[facility];  Service: Urology;  Laterality: Bilateral;   DENTAL EXAMINATION UNDER ANESTHESIA     ESOPHAGOGASTRODUODENOSCOPY  12/26/2011   Procedure: ESOPHAGOGASTRODUODENOSCOPY (EGD);  Surgeon: Jerrell KYM Sol, MD;  Location: [facility];  Service: Endoscopy;  Laterality: N/A;   ESOPHAGOGASTRODUODENOSCOPY (EGD) WITH PROPOFOL  N/A 07/29/2014   Procedure: ESOPHAGOGASTRODUODENOSCOPY (EGD) WITH PROPOFOL ;  Surgeon: Jerrell KYM Sol, MD;  Location: [facility] ENDOSCOPY;  Service: Endoscopy;  Laterality: N/A;   EXCISION CHRONIC LEFT BREAST ABSCESS  09-21-2010   EXCISIONAL BX LEFT BREAST MASS/  I  &  D LEFT BREAST ABSCESS  03-24-2009   KNEE ARTHROSCOPY Right 1985   left axilla biopsy  04/2013   left hip biopsy  10/2012   LYMPHADENECTOMY Bilateral 08/18/2013   Procedure: LYMPHADENECTOMY PELVIC LYMPH NODE DISSECTION;  Surgeon: Ricardo Likens, MD;  Location: [facility];  Service: Urology;  Laterality: Bilateral;   POSTERIOR CERVICAL FUSION/FORAMINOTOMY N/A 11/30/2022   Procedure: POSTERIOR DECOMPRESSION AND FUSION CERVICAL 4- CERVICAL 5, CERVICAL 5- CERVICAL 6, CERVICAL 6- CERVICAL 7, CERVICAL 7- THORACIC 1 WITH INSTRUMENTATION AND ALLOGRAFT;  Surgeon: Beuford Anes, MD;  Location: [facility] OR;  Service:  Orthopedics;  Laterality: N/A;   PROSTATE BIOPSY N/A 04/25/2013   Procedure: BIOPSY TRANSRECTAL ULTRASONIC PROSTATE (TUBP);  Surgeon: Ricardo Likens, MD;  Location: [facility];  Service: Urology;  Laterality: N/A;   ROBOT ASSISTED LAPAROSCOPIC RADICAL PROSTATECTOMY N/A 08/18/2013   Procedure: ROBOTIC ASSISTED LAPAROSCOPIC RADICAL PROSTATECTOMY;  Surgeon: Ricardo Likens, MD;  Location: [facility];  Service: Urology;  Laterality: N/A;   SHOULDER ARTHROSCOPY WITH ROTATOR CUFF REPAIR AND SUBACROMIAL DECOMPRESSION Left 03/13/2023   Procedure: SHOULDER ARTHROSCOPY WITH ROTATOR CUFF REPAIR AND SUBACROMIAL DECOMPRESSION, DISTAL CLAVICLECTOMY, BICEPS TENODESIS;  Surgeon: Beverley Evalene BIRCH, MD;  Location: [facility];  Service: Orthopedics;  Laterality: Left;   TOTAL KNEE ARTHROPLASTY Right 08/01/2016   08/17/16   TOTAL KNEE ARTHROPLASTY Right 08/01/2016   Procedure: TOTAL KNEE ARTHROPLASTY;  Surgeon: Evalene BIRCH Beverley, MD;  Location: [facility] OR;  Service: Orthopedics;  Laterality: Right;   UPPER GASTROINTESTINAL ENDOSCOPY     UPPER LEG SOFT TISSUE BIOPSY Left 2012   lthigh    Family History: Family History  Problem Relation Age of Onset   Leukemia Mother    Stroke  Father    Diabetes Father    Cancer Father        brain cancer   Asthma Father    Hypertension Sister    Asthma Sister    Prostate cancer Maternal Uncle    Colon cancer Neg Hx    Colon polyps Neg Hx    Esophageal cancer Neg Hx    Stomach cancer Neg Hx    Rectal cancer Neg Hx     Social History:   reports that he quit smoking about 13 years ago. His smoking use included cigars and cigarettes. He started smoking about 36 years ago. He has a 34.5 pack-year smoking history. He has never used smokeless tobacco. He reports that he does not currently use alcohol . He reports that he does not use drugs.  Medications: Facility-Administered Medications Prior to Admission  Medication Dose Route Frequency Provider Last  Rate Last Admin   cabotegravir  & rilpivirine  ER (CABENUVA ) 600 & 900 MG/3ML injection 1 kit  1 kit Intramuscular Q2 months        Medications Prior to Admission  Medication Sig Dispense Refill   albuterol  (PROVENTIL ) (2.5 MG/3ML) 0.083% nebulizer solution Take 3 mLs (2.5 mg total) by nebulization every 6 (six) hours as needed for wheezing or shortness of breath. 75 mL 3   albuterol  (VENTOLIN  HFA) 108 (90 Base) MCG/ACT inhaler Inhale 2 puffs into the lungs every 6 (six) hours as needed for wheezing or shortness of breath. 36 g 3   ALPRAZolam  (XANAX ) 0.5 MG tablet TAKE 1 TABLET(0.5 MG) BY MOUTH TWICE DAILY AS NEEDED FOR SLEEP 60 tablet 5   azelastine  (ASTELIN ) 0.1 % nasal spray Place 2 sprays into both nostrils in the morning. Use in each nostril as directed     BREZTRI  AEROSPHERE 160-9-4.8 MCG/ACT AERO INHALE 2 PUFFS INTO THE LUNGS IN THE MORNING AND AT BEDTIME 10.7 g 3   cabotegravir  & rilpivirine  ER (CABENUVA ) 600 & 900 MG/3ML injection Inject into the muscle once a month for two months then every other month 6 mL 0   cetirizine  (ZYRTEC ) 10 MG tablet TAKE 1 TABLET(10 MG) BY MOUTH DAILY 90 tablet 3   Cholecalciferol  (VITAMIN D3) 250 MCG (10000 UT) capsule Take 1 tablet by mouth daily. [employer]'s [employer]     diphenhydrAMINE  (BENADRYL ) 25 MG tablet Take 1 tablet (25 mg total) by mouth every 6 (six) hours as needed. (Patient taking differently: Take 25 mg by mouth every 6 (six) hours as needed for sleep, itching or allergies.) 30 tablet 0   dorzolamide  (TRUSOPT ) 2 % ophthalmic solution Place 1 drop into both eyes 2 (two) times daily.     famotidine  (PEPCID ) 40 MG tablet Take 1 tablet (40 mg total) by mouth at bedtime. 90 tablet 3   gabapentin  (NEURONTIN ) 300 MG capsule TAKE 1 CAPSULE(300 MG) BY MOUTH FOUR TIMES DAILY AS NEEDED FOR PAIN 120 capsule 4   Glucos-Chond-Hyal Ac-Ca Fructo (MOVE FREE JOINT HEALTH ADVANCE PO) Take 1 tablet by mouth daily.     hydrOXYzine  (ATARAX ) 50 MG tablet TAKE 1 TABLET(50 MG)  BY MOUTH TWICE DAILY 180 tablet 0   methocarbamol  (ROBAXIN ) 500 MG tablet Take 1-2 tablets (500-1,000 mg total) by mouth every 6 (six) hours as needed for muscle spasms. 30 tablet 2   metoCLOPramide  (REGLAN ) 5 MG tablet Take 1 tablet (5 mg total) by mouth 4 (four) times daily -  before meals and at bedtime. 120 tablet 3   metoprolol  succinate (TOPROL -XL) 100 MG 24 hr tablet Take  1 tablet (100 mg total) by mouth daily. Please call office to schedule an appt for further refills. Thank you 30 tablet 3   montelukast  (SINGULAIR ) 10 MG tablet Take 10 mg by mouth daily.     Multiple Vitamin (MULTIVITAMIN WITH MINERALS) TABS tablet Take 1 tablet by mouth daily after breakfast.      mupirocin  ointment (BACTROBAN ) 2 % APPLY EXTERNALLY TO THE AFFECTED AREA TWICE DAILY FOR 3 WEEKS, PLACE COTTON BETWEEN TOES TO ALLOW BETTER AERATION AS NEEDED (Patient taking differently: Apply 1 application  topically daily as needed (affected toes).) 66 g 1   ondansetron  (ZOFRAN -ODT) 4 MG disintegrating tablet Take 1 tablet (4 mg total) by mouth every 8 (eight) hours as needed for nausea or vomiting. (Patient taking differently: Take 4 mg by mouth every 4 (four) hours as needed for nausea or vomiting.) 30 tablet 2   OVER THE COUNTER MEDICATION Take 2 capsules by mouth daily. Zhou: thyroid  supplement     pantoprazole  (PROTONIX ) 40 MG tablet Take 1 tablet (40 mg total) by mouth 2 (two) times daily before a meal. 180 tablet 3   QNASL  80 MCG/ACT AERS USE 2 SPRAYS IN EACH NOSTRIL DAILY (Patient taking differently: Place 2 sprays into both nostrils in the morning and at bedtime.) 10.6 g 2   QULIPTA  30 MG TABS TAKE 1 TABLET(30 MG) BY MOUTH DAILY 30 tablet 5   rosuvastatin  (CRESTOR ) 20 MG tablet TAKE 1 TABLET(20 MG) BY MOUTH DAILY (Patient taking differently: Take 20 mg by mouth daily.) 90 tablet 3   tizanidine  (ZANAFLEX ) 6 MG capsule Take 6 mg by mouth 3 (three) times daily.     torsemide  (DEMADEX ) 20 MG tablet Take 10 mg by mouth See  admin instructions. Tuesday Thursday Saturday     Ubrogepant  (UBRELVY ) 100 MG TABS Take 1 tablet (100 mg total) by mouth as needed (take 1 tablet at onset of migraine. may take an additional tablet 2 hrs later if needed (Do not exceed more than 2 tablets in 24 hour period)). 16 tablet 5   BELBUCA 300 MCG FILM STATES NOT USING BECAUSE THEY DONT WORK     EPINEPHrine  0.3 mg/0.3 mL IJ SOAJ injection Inject 0.3 mg into the muscle as needed for anaphylaxis. 1 each 5   meclizine  (ANTIVERT ) 25 MG tablet Take 1 tablet (25 mg total) by mouth daily as needed. 40 tablet 5   nitroGLYCERIN  (NITROSTAT ) 0.4 MG SL tablet Place 1 tablet (0.4 mg total) under the tongue every 5 (five) minutes as needed for chest pain. 30 tablet 1    Results for orders placed or performed during the hospital encounter of 08/09/23 (from the past 48 hours)  Basic metabolic panel per protocol     Status: Abnormal   Collection Time: 08/07/23  3:07 PM  Result Value Ref Range   Sodium 137 135 - 145 mmol/L   Potassium 3.8 3.5 - 5.1 mmol/L   Chloride 104 98 - 111 mmol/L   CO2 23 22 - 32 mmol/L   Glucose, Bld 121 (H) 70 - 99 mg/dL    Comment: Glucose reference range applies only to samples taken after fasting for at least 8 hours.   BUN 8 6 - 20 mg/dL   Creatinine, Ser 8.58 (H) 0.61 - 1.24 mg/dL   Calcium  9.2 8.9 - 10.3 mg/dL   GFR, Estimated 57 (L) >60 mL/min    Comment: (NOTE) Calculated using the CKD-EPI Creatinine Equation (2021)    Anion gap 10 5 - 15  Comment: Performed at [facility], [address], [place], [place] [address]   *Note: Due to a large number of results and/or encounters for the requested time period, some results have not been displayed. A complete set of results can be found in Results Review.    No results found.    Blood pressure (!) 145/97, pulse 75, temperature 98.4 F (36.9 C), temperature source Temporal, resp. rate 17, height 5' 9 (1.753 m), weight 101.2 kg, SpO2 94%.  General  appearance: alert, cooperative, and appears stated age Head: Normocephalic, without obvious abnormality, atraumatic Neck: supple, symmetrical, trachea midline Extremities: Intact sensation and capillary refill all digits with some decreased sensation in right ring and small fingers.  +epl/fpl/io.  No wounds.  Skin: Skin color, texture, turgor normal. No rashes or lesions Neurologic: Grossly normal Incision/Wound: none  Assessment/Plan Right carpal tunnel syndrome and ulnar neuropathy at elbow.  Non operative and operative treatment options have been discussed with the patient and patient wishes to proceed with operative treatment. Risks, benefits, and alternatives of surgery have been discussed and the patient agrees with the plan of care.   Getsemani Lindon 08/09/2023, 10:39 AM

## 2023-08-09 NOTE — Op Note (Signed)
 08/09/2023 [facility]                              OPERATIVE REPORT   PREOPERATIVE DIAGNOSIS: 1.  Right carpal tunnel syndrome 2.  Right ulnar neuropathy at the elbow  POSTOPERATIVE DIAGNOSIS:   1.  Right carpal tunnel syndrome 2.  Right ulnar neuropathy at the elbow  PROCEDURE:  1.  Right carpal tunnel release 2.  Right ulnar nerve decompression at elbow  SURGEON:  Franky Curia, MD  ASSISTANT: Arley Curia, MD  ANESTHESIA: General  IV FLUIDS:  Per anesthesia flow sheet.  ESTIMATED BLOOD LOSS:  Minimal.  COMPLICATIONS:  None.  SPECIMENS:  None.  TOURNIQUET TIME:    Total Tourniquet Time Documented: Upper Arm (Right) - 33 minutes Total: Upper Arm (Right) - 33 minutes   DISPOSITION:  Stable to PACU.  LOCATION: [facility]  INDICATIONS:  63 y.o. yo male with numbness and tingling right hand.  Positive nerve conduction studies. He wishes to proceed with right carpal tunnel release and ulnar nerve decompression at elbow with transposition as necessary.  Risks, benefits and alternatives of surgery were discussed including the risk of blood loss; infection; damage to nerves, vessels, tendons, ligaments, bone; failure of surgery; need for additional surgery; complications with wound healing; continued pain; recurrence of carpal tunnel syndrome; and damage to motor branch. He voiced understanding of these risks and elected to proceed.   OPERATIVE COURSE:  After being identified preoperatively by myself, the patient and I agreed upon the procedure and site of procedure.  The surgical site was marked.  Surgical consent had been signed.  He was given IV Ancef  as preoperative antibiotic prophylaxis.  He was transferred to the operating room and placed on the operating room table in supine position with the right upper extremity on an armboard.  General anesthesia was induced by the anesthesiologist.  Right upper extremity was prepped and draped in normal  sterile orthopaedic fashion.  A surgical pause was performed between the surgeons, anesthesia, and operating room staff, and all were in agreement as to the patient, procedure, and site of procedure.  Tourniquet at the proximal aspect of the extremity was inflated to 250 mmHg after exsanguination of the arm with an Esmarch bandage  Incision was made over the transverse carpal ligament and carried into the subcutaneous tissues by spreading technique.  Bipolar electrocautery was used to obtain hemostasis.  The palmar fascia was sharply incised.  The transverse carpal ligament was identified.  The fascia distal to the ligament was opened.  Retractor was placed and the flexor tendons were identified.  The flexor tendon to the ring finger was identified and retracted radially.  The transverse carpal ligament was then incised from distal to proximal under direct visualization.  Scissors were used to split the distal aspect of the volar antebrachial fascia.  A finger was placed into the wound to ensure complete decompression, which was the case.  The nerve was examined.  It was adherent to the radial leaflet.  The motor branch was identified and was intact.  The wound was copiously irrigated with sterile saline.  It was then closed with 4-0 nylon in a horizontal mattress fashion.  Incision was then made at the medial side of the elbow.  This was carried in subcutaneous tissues by spreading technique.  Bipolar electrocautery is used to obtain hemostasis.  The ulnar nerve was identified proximal to Osborne's ligament.  It was protected and Osborne's ligament released from proximal to distal under direct visualization.  The ulnar nerve was swollen.  The muscular fascia over the FCU muscle was released and the FCU muscle belly spread.  The investing fascia over the nerve was released under direct visualization while protecting the nerve.  Motor branches to the FCU muscle were protected.  The nerve was then decompressed  proximally.  The muscular fascia was released and then investing fascia over the nerve released under direct visualization while protecting the nerve.  The elbow was flexed and the nerve did not subluxate.  The wound was irrigated with sterile saline.  The anterior leaflet of Osborne's ligament was repaired to the posterior subcutaneous tissues with 2-0 Vicryl suture.  Care was taken to prevent any new compression over the nerve.  Inverted interrupted 4-0 Vicryl sutures were placed in subcutaneous tissues and skin was closed with 4-0 nylon in a horizontal mattress fashion.  The wounds were injected with 0.25% plain Marcaine  to aid in postoperative analgesia.  They were dressed with sterile Xeroform, 4x4s, an ABD, and wrapped with Kerlix and an Ace bandage.  Tourniquet was deflated at 33 minutes.  Fingertips were pink with brisk capillary refill after deflation of the tourniquet.  Operative drapes were broken down.  The patient was awoken from anesthesia safely.  He was transferred back to stretcher and taken to the PACU in stable condition.  I will see him back in the office in 1 week for postoperative followup.  I will give him a prescription for Norco 5/325 1-2 tabs PO q6 hours prn pain, dispense # 20.    Laurajean Hosek, MD Electronically signed, 08/09/23

## 2023-08-09 NOTE — Anesthesia Preprocedure Evaluation (Addendum)
 Anesthesia Evaluation  Patient identified by MRN, date of birth, ID band Patient awake    Reviewed: Allergy & Precautions, NPO status , Patient's Chart, lab work & pertinent test results  History of Anesthesia Complications Negative for: history of anesthetic complications  Airway Mallampati: II  TM Distance: >3 FB Neck ROM: Limited   Comment: Previous grade II view with MAC 4, mask with OPA Dental  (+) Dental Advisory Given, Edentulous Upper, Edentulous Lower, Upper Dentures, Lower Dentures   Pulmonary neg shortness of breath, asthma , neg sleep apnea, pneumonia, COPD (last used a couple of days ago),  COPD inhaler, neg recent URI, former smoker Bronchiectasis, pulmonary nodule    breath sounds clear to auscultation Wheezing: mild end-expiratory wheezing heard on a couple of breaths.      Cardiovascular hypertension, Pt. on home beta blockers (-) angina + Peripheral Vascular Disease and +CHF  (-) Past MI, (-) Cardiac Stents and (-) CABG  Rhythm:Regular Rate:Normal  H/o pericarditis in 2010, HLD  Echocardiogram 03/03/2022:  Normal LV systolic function with visual EF 60-65%. Left ventricle cavity  is normal in size. Mild concentric hypertrophy of the left ventricle.  Normal global wall motion. Normal diastolic filling pattern, normal LAP.  Calculated EF 61%.  Structurally normal tricuspid valve with no regurgitation. No evidence of  pulmonary hypertension.  compared to echo from 2021, EF has now normalized.   Echocardiogram 05/06/2020: Moderately depressed LV systolic function with EF 37%. Left ventricle cavity is normal in size. Moderate concentric hypertrophy of the left ventricle. Hypokinetic global wall motion. Doppler evidence of grade I (impaired) diastolic dysfunction, normal LAP. Calculated EF 37%. Structurally normal mitral valve.  Mild (Grade I) mitral regurgitation. Mild pulmonic regurgitation.        Neuro/Psych  Headaches, neg Seizures H/o viral meningitis in 2022  Neuromuscular disease (neuropathy, cervical radiculopathy s/p fusion)    GI/Hepatic Neg liver ROS, hiatal hernia, PUD,GERD (eosinophilic esophagitis)  ,,  Endo/Other  negative endocrine ROS    Renal/GU CRFRenal disease (right renal mass)   H/o prostate cancer    Musculoskeletal  (+) Arthritis ,    Abdominal  (+) + obese  Peds  Hematology  (+) Blood dyscrasia, anemia   Anesthesia Other Findings HIV on Biktarvy   Reproductive/Obstetrics                              Anesthesia Physical Anesthesia Plan  ASA: 3  Anesthesia Plan: General   Post-op Pain Management: Tylenol  PO (pre-op)* and Gabapentin  PO (pre-op)*   Induction: Intravenous, Rapid sequence and Cricoid pressure planned  PONV Risk Score and Plan: 2 and Ondansetron , Dexamethasone  and Treatment may vary due to age or medical condition  Airway Management Planned: Oral ETT and Video Laryngoscope Planned  Additional Equipment:   Intra-op Plan:   Post-operative Plan: Extubation in OR  Informed Consent: I have reviewed the patients History and Physical, chart, labs and discussed the procedure including the risks, benefits and alternatives for the proposed anesthesia with the patient or authorized representative who has indicated his/her understanding and acceptance.     Dental advisory given  Plan Discussed with: CRNA  Anesthesia Plan Comments: (Risks of anesthesia explained at length. This includes, but is not limited to, sore throat, damage to teeth, lips gums, tongue and vocal cords, nausea and vomiting, reactions to medications, stroke, heart attack, and death. All patient questions were answered and the patient wishes to proceed.  Due to significant neuropathy,  I discussed my concern with regional in his case. Furthermore, his gastroparesis necessitates GETA in my opinion. I recommended no block with GETA.  )         Anesthesia Quick Evaluation

## 2023-08-09 NOTE — Anesthesia Procedure Notes (Signed)
 Procedure Name: Intubation Date/Time: 08/09/2023 12:34 PM  Performed by: Emilio Rock BIRCH, CRNAPre-anesthesia Checklist: Patient identified, Emergency Drugs available, Suction available and Patient being monitored Patient Re-evaluated:Patient Re-evaluated prior to induction Oxygen  Delivery Method: Circle system utilized Preoxygenation: Pre-oxygenation with 100% oxygen  Induction Type: IV induction Ventilation: Mask ventilation without difficulty Laryngoscope Size: Glidescope, Mac and 4 Grade View: Grade I Tube type: Oral Tube size: 7.5 mm Number of attempts: 1 Airway Equipment and Method: Stylet and Oral airway Placement Confirmation: ETT inserted through vocal cords under direct vision, positive ETCO2 and breath sounds checked- equal and bilateral Secured at: 22 cm Tube secured with: Tape Dental Injury: Teeth and Oropharynx as per pre-operative assessment  Difficulty Due To: Difficulty was anticipated, Difficult Airway- due to dentition and Difficult Airway- due to reduced neck mobility

## 2023-08-10 ENCOUNTER — Encounter (HOSPITAL_BASED_OUTPATIENT_CLINIC_OR_DEPARTMENT_OTHER): Payer: Self-pay | Admitting: Orthopedic Surgery

## 2023-08-15 ENCOUNTER — Other Ambulatory Visit: Payer: Self-pay | Admitting: Internal Medicine

## 2023-08-15 ENCOUNTER — Other Ambulatory Visit: Payer: Self-pay

## 2023-08-15 ENCOUNTER — Other Ambulatory Visit: Payer: Self-pay | Admitting: Cardiology

## 2023-08-15 ENCOUNTER — Other Ambulatory Visit: Payer: Self-pay | Admitting: *Deleted

## 2023-08-15 DIAGNOSIS — G44229 Chronic tension-type headache, not intractable: Secondary | ICD-10-CM

## 2023-08-15 DIAGNOSIS — G250 Essential tremor: Secondary | ICD-10-CM

## 2023-08-15 DIAGNOSIS — G4700 Insomnia, unspecified: Secondary | ICD-10-CM

## 2023-08-15 DIAGNOSIS — B20 Human immunodeficiency virus [HIV] disease: Secondary | ICD-10-CM

## 2023-08-15 DIAGNOSIS — R252 Cramp and spasm: Secondary | ICD-10-CM

## 2023-08-15 MED ORDER — ALPRAZOLAM 0.5 MG PO TABS
ORAL_TABLET | ORAL | 2 refills | Status: DC
Start: 2023-08-15 — End: 2023-11-22

## 2023-08-15 NOTE — Telephone Encounter (Signed)
Last seen on 11/16/22 Follow up scheduled on 11/22/23 Last filled on 06/10/23 #60 tablets (30 day supply) Rx pending to be signed

## 2023-08-17 ENCOUNTER — Ambulatory Visit: Payer: Self-pay | Admitting: Internal Medicine

## 2023-08-17 NOTE — Telephone Encounter (Signed)
Chief Complaint: Chest pain (aching) Symptoms: Fatigue, lightheaded, congestion (yellow), difficulty breathing Frequency: Constant  Pertinent Negatives: Patient denies fever, cough, dizziness, nausea, vomiting Disposition: [x] ED /[] Urgent Care (no appt availability in office) / [] Appointment(In office/virtual)/ []  [place] [facility]/ [] Home Care/ [] Refused Recommended Disposition /[] [place] [facility]/ []  Follow-up with PCP Additional Notes: Spoke with pt sister, April. Pt has been having congestion, weakness, and chest "aching" to both shoulders that started 3 days ago.  Pt sister clarifies "it is not chest pain, it is aching." Pt feels "run down." Pt recently had carpal tunnel release and elbow surgery on 2/6. Pt states every now and then he feels lightheaded. Pt also having some difficulty breathing. Pt advised to go to ED based on symptoms. Pt sister taking him to ED now. Pt sister agreeable to plan.   Copied from CRM [id number]. Topic: Clinical - Red Word Triage >> Aug 17, 2023  4:25 PM Eunice Blase wrote: Red Word that prompted transfer to Nurse Triage: Pt's sister called pain across chest, congestion, weak. Reason for Disposition  Pain also in shoulder(s) or arm(s) or jaw  (Exception: Pain is clearly made worse by movement.)  Answer Assessment - Initial Assessment Questions 1. LOCATION: "Where does it hurt?"       Chest 2. RADIATION: "Does the pain go anywhere else?" (e.g., into neck, jaw, arms, back)     Radiates to both shoulders 4. PATTERN: "Does the pain come and go, or has it been constant since it started?"  "Does it get worse with exertion?"      Constant 6. SEVERITY: "How bad is the pain?"  (e.g., Scale 1-10; mild, moderate, or severe)    - MILD (1-3): doesn't interfere with normal activities     - MODERATE (4-7): interferes with normal activities or awakens from sleep    - SEVERE (8-10): excruciating pain, unable to do any normal activities       5 7. CARDIAC RISK  FACTORS: "Do you have any history of heart problems or risk factors for heart disease?" (e.g., angina, prior heart attack; diabetes, high blood pressure, high cholesterol, smoker, or strong family history of heart disease)     5 10. OTHER SYMPTOMS: "Do you have any other symptoms?" (e.g., dizziness, nausea, vomiting, sweating, fever, difficulty breathing, cough)       Lightheaded at times  Answer Assessment - Initial Assessment Questions 2. SEVERITY: "How bad is it?"  "Can you stand and walk?"   - MILD (0-3): Feels weak or tired, but does not interfere with work, school or normal activities.   - MODERATE (4-7): Able to stand and walk; weakness interferes with work, school, or normal activities.   - SEVERE (8-10): Unable to stand or walk; unable to do usual activities.     Mild 3. ONSET: "When did these symptoms begin?" (e.g., hours, days, weeks, months)     3 days 4. CAUSE: "What do you think is causing the weakness or fatigue?" (e.g., not drinking enough fluids, medical problem, trouble sleeping)     Not sure 5. NEW MEDICINES:  "Have you started on any new medicines recently?" (e.g., opioid pain medicines, benzodiazepines, muscle relaxants, antidepressants, antihistamines, neuroleptics, beta blockers)     Hydrocodone 5-325 1-2 tablets q6 hr, reglan 4 mg 6. OTHER SYMPTOMS: "Do you have any other symptoms?" (e.g., chest pain, fever, cough, SOB, vomiting, diarrhea, bleeding, other areas of pain)     Denies  Protocols used: Weakness (Generalized) and Fatigue-A-AH, Chest Pain-A-AH

## 2023-08-20 DIAGNOSIS — M47812 Spondylosis without myelopathy or radiculopathy, cervical region: Secondary | ICD-10-CM | POA: Diagnosis not present

## 2023-08-21 DIAGNOSIS — G5601 Carpal tunnel syndrome, right upper limb: Secondary | ICD-10-CM | POA: Diagnosis not present

## 2023-08-21 DIAGNOSIS — G5621 Lesion of ulnar nerve, right upper limb: Secondary | ICD-10-CM | POA: Diagnosis not present

## 2023-08-23 ENCOUNTER — Other Ambulatory Visit: Payer: Self-pay | Admitting: Internal Medicine

## 2023-08-23 NOTE — Telephone Encounter (Signed)
Copied from CRM [id number]. Topic: Clinical - Medication Refill >> Aug 23, 2023  4:42 PM Thomes Dinning wrote: Most Recent Primary Care Visit:  Provider: Alinda Sierras  Department: [facility]  Visit Type: INJECTION  Date: 07/20/2023  Medication:  meclizine (ANTIVERT) 25 MG tablet tizanidine (ZANAFLEX) 6 MG capsule  Has the patient contacted their pharmacy? Yes (Agent: If no, request that the patient contact the pharmacy for the refill. If patient does not wish to contact the pharmacy document the reason why and proceed with request.) (Agent: If yes, when and what did the pharmacy advise?)  Is this the correct pharmacy for this prescription? Yes If no, delete pharmacy and type the correct one.  This is the patient's preferred pharmacy:  [id number] - [place], [place] - [address] AT [facility] OF [address] [place] [address] Phone: [phone] Fax: [phone]  Has the prescription been filled recently? No  Is the patient out of the medication? Yes  Has the patient been seen for an appointment in the last year OR does the patient have an upcoming appointment? Yes  Can we respond through MyChart? Yes  Agent: Please be advised that Rx refills may take up to 3 business days. We ask that you follow-up with your pharmacy.

## 2023-08-24 ENCOUNTER — Other Ambulatory Visit: Payer: Self-pay

## 2023-08-24 MED ORDER — TIZANIDINE HCL 6 MG PO CAPS: 6.0000 mg | ORAL_CAPSULE | Freq: Three times a day (TID) | ORAL | 3 refills | Status: DC

## 2023-08-24 MED ORDER — MECLIZINE HCL 25 MG PO TABS: 25.0000 mg | ORAL_TABLET | Freq: Every day | ORAL | 5 refills | Status: DC | PRN

## 2023-08-27 DIAGNOSIS — N1831 Chronic kidney disease, stage 3a: Secondary | ICD-10-CM | POA: Diagnosis not present

## 2023-08-27 DIAGNOSIS — N189 Chronic kidney disease, unspecified: Secondary | ICD-10-CM | POA: Diagnosis not present

## 2023-08-27 DIAGNOSIS — N2581 Secondary hyperparathyroidism of renal origin: Secondary | ICD-10-CM | POA: Diagnosis not present

## 2023-08-27 DIAGNOSIS — D631 Anemia in chronic kidney disease: Secondary | ICD-10-CM | POA: Diagnosis not present

## 2023-08-27 DIAGNOSIS — I129 Hypertensive chronic kidney disease with stage 1 through stage 4 chronic kidney disease, or unspecified chronic kidney disease: Secondary | ICD-10-CM | POA: Diagnosis not present

## 2023-08-28 ENCOUNTER — Other Ambulatory Visit: Payer: Self-pay

## 2023-08-28 DIAGNOSIS — G5621 Lesion of ulnar nerve, right upper limb: Secondary | ICD-10-CM | POA: Diagnosis not present

## 2023-08-28 DIAGNOSIS — M25641 Stiffness of right hand, not elsewhere classified: Secondary | ICD-10-CM | POA: Diagnosis not present

## 2023-08-28 DIAGNOSIS — M79641 Pain in right hand: Secondary | ICD-10-CM | POA: Diagnosis not present

## 2023-08-28 DIAGNOSIS — G5601 Carpal tunnel syndrome, right upper limb: Secondary | ICD-10-CM | POA: Diagnosis not present

## 2023-08-28 DIAGNOSIS — R29898 Other symptoms and signs involving the musculoskeletal system: Secondary | ICD-10-CM | POA: Diagnosis not present

## 2023-08-28 LAB — LAB REPORT - SCANNED
Creatinine, POC: 192.3 mg/dL
PTH: 36

## 2023-08-29 ENCOUNTER — Ambulatory Visit: Payer: 59 | Admitting: Cardiology

## 2023-08-30 ENCOUNTER — Other Ambulatory Visit: Payer: Self-pay

## 2023-08-30 ENCOUNTER — Telehealth: Payer: Self-pay | Admitting: Internal Medicine

## 2023-08-30 DIAGNOSIS — R140 Abdominal distension (gaseous): Secondary | ICD-10-CM

## 2023-08-30 NOTE — Telephone Encounter (Signed)
 Spoke with pts sister and she states that pt has been taking 5mg  reglan tid trying to work up to qid. Reports after his hand surgery his abdomen seemed to be distended and it would come and go. Reports now it is distended all the time and he has a hard time leaning over to tie his shoes. Seems to have lots of bloating. She wants to know what may help with his symptoms. Please advise.

## 2023-08-30 NOTE — Telephone Encounter (Signed)
 Spoke with pts sister and she is aware of recommendation. Order in epic for [place] of abd. She knows they will contact her to set up the apprt.

## 2023-08-30 NOTE — Telephone Encounter (Signed)
 Inbound call from patient's sister stating patient's bloating has been becoming worse due to gastroparesis. Patient's sister is requesting a call to discuss further. Please advise, thank you.

## 2023-09-03 ENCOUNTER — Other Ambulatory Visit (HOSPITAL_COMMUNITY): Payer: Self-pay

## 2023-09-03 ENCOUNTER — Ambulatory Visit: Payer: Self-pay | Admitting: Internal Medicine

## 2023-09-03 ENCOUNTER — Other Ambulatory Visit: Payer: Self-pay

## 2023-09-03 ENCOUNTER — Telehealth: Payer: Self-pay | Admitting: Neurology

## 2023-09-03 ENCOUNTER — Other Ambulatory Visit: Payer: Self-pay | Admitting: Infectious Diseases

## 2023-09-03 NOTE — Telephone Encounter (Signed)
 Chief Complaint: Nausea Symptoms: Body aches, shakes, vomiting, fatigue Frequency: Intermittent Pertinent Negatives: Patient denies body aches or vomiting today, difficulty breathing Disposition: [] ED /[] Urgent Care (no appt availability in office) / [x] Appointment(In office/virtual)/ []  [place] [facility]/ [] Home Care/ [] Refused Recommended Disposition /[] [place] [facility]/ []  Follow-up with PCP Additional Notes: Spoke with pt's sister, April. Pt started having body aches on Saturday. Pt then started feeling cold, nauseous, having shakes, vomiting, and fatigue. Pt has not vomited today and no longer has body aches. Pt sister has been giving him the Zofran Dr. Jonny Ruiz prescribed for him. Pt got vaccinated (TDAP and TDAP+whooping cough, RSV, Flu) last week, 2/24. Pt scheduled for an appointment with PCP tomorrow. This RN educated pt on home care, new-worsening symptoms, when to call back/seek emergent care. Pt verbalized understanding and agrees to plan.     Copied from CRM [id number]. Topic: Clinical - Red Word Triage >> Sep 03, 2023  3:12 PM [place] A wrote: [facility] that prompted transfer to Nurse Triage: Patients wife called said patient is having nausea,dizziness when he stands, vomiting and body aches Reason for Disposition  Nausea lasts > 1 week  Answer Assessment - Initial Assessment Questions 1. ONSET: "When did the nausea begin?"     Saturday 2. VOMITING: "Any vomiting?" If Yes, ask: "How many times today?"     Yesterday at least 3-4 times, none today 3. RECURRENT SYMPTOM: "Have you had nausea before?" If Yes, ask: "When was the last time?" "What happened that time?"     No he doesn't usually get nauseous  Protocols used: Nausea-A-AH

## 2023-09-03 NOTE — Telephone Encounter (Signed)
 Pt's sister on DPR called wanting to inform the provider that the pt has started having shakes. Body ache, regurgitation, and headaches on the R side. She states it is similar to the symptoms that he had when he had Meningitis. She stated that she was informed that if this happens again to call the office to be advised on what to do.

## 2023-09-03 NOTE — Progress Notes (Signed)
 Specialty Pharmacy Refill Coordination Note  Casey Brennan is a 63 y.o. male contacted today regarding refills of specialty medication(s) Cabotegravir & Rilpivirine [name])   Patient requested Courier to Provider Office   Delivery date: 09/06/23   Verified address: [facility] at [facility] [address], [address]   [place] [address]   Medication will be filled on 09/05/23.   This fill date is pending response to refill request from provider. Patient is aware and if they have not received fill by intended date, they must follow up with pharmacy.

## 2023-09-04 ENCOUNTER — Encounter: Payer: Self-pay | Admitting: Internal Medicine

## 2023-09-04 ENCOUNTER — Other Ambulatory Visit: Payer: Self-pay

## 2023-09-04 ENCOUNTER — Ambulatory Visit (INDEPENDENT_AMBULATORY_CARE_PROVIDER_SITE_OTHER): Admitting: Internal Medicine

## 2023-09-04 ENCOUNTER — Ambulatory Visit (INDEPENDENT_AMBULATORY_CARE_PROVIDER_SITE_OTHER)

## 2023-09-04 VITALS — BP 128/76 | HR 70 | Temp 98.1°F | Ht 69.0 in | Wt 233.0 lb

## 2023-09-04 DIAGNOSIS — R29898 Other symptoms and signs involving the musculoskeletal system: Secondary | ICD-10-CM | POA: Diagnosis not present

## 2023-09-04 DIAGNOSIS — M47812 Spondylosis without myelopathy or radiculopathy, cervical region: Secondary | ICD-10-CM | POA: Insufficient documentation

## 2023-09-04 DIAGNOSIS — E782 Mixed hyperlipidemia: Secondary | ICD-10-CM | POA: Diagnosis not present

## 2023-09-04 DIAGNOSIS — R112 Nausea with vomiting, unspecified: Secondary | ICD-10-CM

## 2023-09-04 DIAGNOSIS — J4530 Mild persistent asthma, uncomplicated: Secondary | ICD-10-CM

## 2023-09-04 DIAGNOSIS — Z0001 Encounter for general adult medical examination with abnormal findings: Secondary | ICD-10-CM

## 2023-09-04 DIAGNOSIS — M722 Plantar fascial fibromatosis: Secondary | ICD-10-CM | POA: Insufficient documentation

## 2023-09-04 DIAGNOSIS — I10 Essential (primary) hypertension: Secondary | ICD-10-CM | POA: Diagnosis not present

## 2023-09-04 DIAGNOSIS — E559 Vitamin D deficiency, unspecified: Secondary | ICD-10-CM

## 2023-09-04 DIAGNOSIS — Z0000 Encounter for general adult medical examination without abnormal findings: Secondary | ICD-10-CM

## 2023-09-04 DIAGNOSIS — I878 Other specified disorders of veins: Secondary | ICD-10-CM | POA: Diagnosis not present

## 2023-09-04 DIAGNOSIS — R109 Unspecified abdominal pain: Secondary | ICD-10-CM | POA: Diagnosis not present

## 2023-09-04 DIAGNOSIS — R1110 Vomiting, unspecified: Secondary | ICD-10-CM | POA: Diagnosis not present

## 2023-09-04 DIAGNOSIS — M25641 Stiffness of right hand, not elsewhere classified: Secondary | ICD-10-CM | POA: Diagnosis not present

## 2023-09-04 DIAGNOSIS — M79641 Pain in right hand: Secondary | ICD-10-CM | POA: Diagnosis not present

## 2023-09-04 HISTORY — DX: Nausea with vomiting, unspecified: R11.2

## 2023-09-04 MED ORDER — CABENUVA 600 & 900 MG/3ML IM SUER
INTRAMUSCULAR | 0 refills | Status: DC
  Filled 2023-09-04: qty 6, 60d supply, fill #0

## 2023-09-04 NOTE — Telephone Encounter (Signed)
**   If sister calls back, looks like the patient addressed concerns with PCP as well in phone note. I think should follow with PCP first and only [place] if they feel neurology is needed.

## 2023-09-04 NOTE — Progress Notes (Unsigned)
 Patient ID: Casey Brennan, male   DOB: [birth year], 63 y.o.   MRN: [id number]         Chief Complaint:: wellness exam and Nausea (Vomiting six times last night started last Tuesday , having body aches and chills, have been going on since after getting getting vaccine and had no energy )  , copd, htn, low vit d       HPI:  Casey Brennan is a 63 y.o. male here for wellness exam; for tdap at pharmacy, for covid booster and flu shot at pharmacy, o/w up to date                        Also Pt denies chest pain, increased sob or doe, wheezing, orthopnea, PND, increased LE swelling, palpitations, dizziness or syncope.  Pt denies polydipsia, polyuria, or new focal neuro s/s.    Pt denies fever, wt loss, night sweats, loss of appetite, or other constitutional symptoms  Denies worsening reflux, abd pain, dysphagia, bowel change or blood. But has persistent n/v since last pm for unclear reason, without abd distension, diarrhea , fever or blood   Wt Readings from Last 3 Encounters:  09/04/23 233 lb (105.7 kg)  08/09/23 223 lb 1.7 oz (101.2 kg)  07/18/23 228 lb (103.4 kg)   BP Readings from Last 3 Encounters:  09/04/23 128/76  08/09/23 (!) 150/91  07/18/23 124/88   Immunization History  Administered Date(s) Administered   Hepatitis A 06/12/2011   Influenza Split 06/12/2012   Influenza Whole 04/19/2009, 07/25/2010, 02/22/2011   Influenza, High Dose Seasonal PF 06/02/2021   Influenza,inj,Quad PF,6+ Mos 06/04/2013, 04/27/2014, 07/21/2015, 05/22/2016, 04/27/2017, 05/20/2020, 04/06/2022   Influenza-Unspecified 01/30/2015   Meningococcal Mcv4o 05/22/2016   PFIZER Comirnaty(Gray Top)Covid-19 Tri-Sucrose Vaccine 05/25/2020   PFIZER(Purple Top)SARS-COV-2 Vaccination 10/23/2019, 11/13/2019, 05/25/2020   PPD Test 05/05/2009   Pfizer Covid-19 Vaccine Bivalent Booster 41yrs & up 04/08/2021   Pneumococcal Conjugate-13 11/11/2020   Pneumococcal Polysaccharide-23 03/22/2009, 08/19/2013, 11/15/2020   Tdap  06/12/2011   Zoster Recombinant(Shingrix) 12/27/2020, 04/06/2022, 06/14/2022   Health Maintenance Due  Topic Date Due   DTaP/Tdap/Td (2 - Td or Tdap) 06/11/2021   COVID-19 Vaccine (6 - 2024-25 season) 03/04/2023      Past Medical History:  Diagnosis Date   Asthma    very rare   Chronic anemia    normocytic   Chronic folliculitis    Chronic kidney disease    Stage 2   COPD (chronic obstructive pulmonary disease) (HCC)    Dyspnea    Environmental and seasonal allergies    Glaucoma    right   H/O pericarditis    2010--  myopercarditis--  resolved   Headache(784.0)    HX SEVERE FRONTAL HA'S   Hiatal hernia    History of gastric ulcer    nonbleeding gastric ulcer 58//24   History of kidney stones    History of MRSA infection 2010   infected boil   HIV (human immunodeficiency virus infection) (HCC) 1988   Hypertension    Hypertensive cardiomyopathy (HCC)    EF 37%, normal coronaries 04/2020 [facility]; LVEF 60-65% 03/2022 echo   Lytic bone lesion of hip    WORK-UP DONE BY ONCOLOGIST DR HA --  NOT MALIGNANT   Nausea & vomiting 09/04/2023   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   Pneumonia    hx of   Post concussion syndrome    resolved   Prostate cancer (HCC) 04/25/2013   gleason  3+3=6, volume 30 gm   Viral meningitis    06/2021   Past Surgical History:  Procedure Laterality Date   ACDCF  09/29/2020   ANTERIOR CERVICAL DECOMPRESSION/DISCECTOMY FUSION 4 LEVELS N/A 09/29/2020   Procedure: ANTERIOR CERVICAL DECOMPRESSION FUSION CERVICAL 3-CERVICAL 4 , CERVICAL 4 - CERVICAL 5, CERVICAL 5 - CERVICAL 6 WITH INSTRUMENTATION AND ALLOGRAFT;  Surgeon: Estill Bamberg, MD;  Location: [facility] OR;  Service: Orthopedics;  Laterality: N/A;   CARDIAC CATHETERIZATION  03-23-2009  DR COOPER   NORMAL CORONARY ARTERIES   CARPAL TUNNEL RELEASE Right 08/09/2023   Procedure: RIGHT CARPAL TUNNEL RELEASE;  Surgeon: Betha Loa, MD;  Location: [facility];  Service: Orthopedics;  Laterality:  Right;  75 MIN   CHOLECYSTECTOMY N/A 11/06/2014   Procedure: LAPAROSCOPIC CHOLECYSTECTOMY WITH INTRAOPERATIVE CHOLANGIOGRAM;  Surgeon: Jimmye Norman, MD;  Location: [facility] OR;  Service: General;  Laterality: N/A;   COLONOSCOPY  12/26/2011   Procedure: COLONOSCOPY;  Surgeon: Shirley Friar, MD;  Location: [facility];  Service: Endoscopy;  Laterality: N/A;   COLONOSCOPY     COLONOSCOPY WITH PROPOFOL N/A 07/29/2014   Procedure: COLONOSCOPY WITH PROPOFOL;  Surgeon: Shirley Friar, MD;  Location: [facility] ENDOSCOPY;  Service: Endoscopy;  Laterality: N/A;   CYSTOSCOPY N/A 05/18/2018   Procedure: CYSTOSCOPY REMOVAL FOREIGN BODY;  Surgeon: Ihor Gully, MD;  Location: [facility]S;  Service: Urology;  Laterality: N/A;   CYSTOSCOPY/RETROGRADE/URETEROSCOPY Bilateral 04/25/2013   Procedure: CYSTOSCOPY/ BILATERAL RETROGRADES; BLADDER BIOPSIES;  Surgeon: Sebastian Ache, MD;  Location: [facility];  Service: Urology;  Laterality: Bilateral;   DENTAL EXAMINATION UNDER ANESTHESIA     ESOPHAGOGASTRODUODENOSCOPY  12/26/2011   Procedure: ESOPHAGOGASTRODUODENOSCOPY (EGD);  Surgeon: Shirley Friar, MD;  Location: [facility];  Service: Endoscopy;  Laterality: N/A;   ESOPHAGOGASTRODUODENOSCOPY (EGD) WITH PROPOFOL N/A 07/29/2014   Procedure: ESOPHAGOGASTRODUODENOSCOPY (EGD) WITH PROPOFOL;  Surgeon: Shirley Friar, MD;  Location: [facility] ENDOSCOPY;  Service: Endoscopy;  Laterality: N/A;   EXCISION CHRONIC LEFT BREAST ABSCESS  09-21-2010   EXCISIONAL BX LEFT BREAST MASS/  I  &  D LEFT BREAST ABSCESS  03-24-2009   KNEE ARTHROSCOPY Right 1985   left axilla biopsy  04/2013   left hip biopsy  10/2012   LYMPHADENECTOMY Bilateral 08/18/2013   Procedure: LYMPHADENECTOMY "PELVIC LYMPH NODE DISSECTION";  Surgeon: Sebastian Ache, MD;  Location: [facility];  Service: Urology;  Laterality: Bilateral;   POSTERIOR CERVICAL FUSION/FORAMINOTOMY N/A 11/30/2022   Procedure: POSTERIOR DECOMPRESSION AND FUSION CERVICAL 4- CERVICAL  5, CERVICAL 5- CERVICAL 6, CERVICAL 6- CERVICAL 7, CERVICAL 7- THORACIC 1 WITH INSTRUMENTATION AND ALLOGRAFT;  Surgeon: Estill Bamberg, MD;  Location: [facility];  Service: Orthopedics;  Laterality: N/A;   PROSTATE BIOPSY N/A 04/25/2013   Procedure: BIOPSY TRANSRECTAL ULTRASONIC PROSTATE (TUBP);  Surgeon: Sebastian Ache, MD;  Location: [facility];  Service: Urology;  Laterality: N/A;   ROBOT ASSISTED LAPAROSCOPIC RADICAL PROSTATECTOMY N/A 08/18/2013   Procedure: ROBOTIC ASSISTED LAPAROSCOPIC RADICAL PROSTATECTOMY;  Surgeon: Sebastian Ache, MD;  Location: [facility];  Service: Urology;  Laterality: N/A;   SHOULDER ARTHROSCOPY WITH ROTATOR CUFF REPAIR AND SUBACROMIAL DECOMPRESSION Left 03/13/2023   Procedure: SHOULDER ARTHROSCOPY WITH ROTATOR CUFF REPAIR AND SUBACROMIAL DECOMPRESSION, DISTAL CLAVICLECTOMY, BICEPS TENODESIS;  Surgeon: Sheral Apley, MD;  Location: [facility];  Service: Orthopedics;  Laterality: Left;   TOTAL KNEE ARTHROPLASTY Right 08/01/2016   08/17/16   TOTAL KNEE ARTHROPLASTY Right 08/01/2016   Procedure: TOTAL KNEE ARTHROPLASTY;  Surgeon: Sheral Apley, MD;  Location:  [facility] OR;  Service: Orthopedics;  Laterality: Right;   ULNAR NERVE TRANSPOSITION Right 08/09/2023   Procedure: RIGHT ULNAR NERVE DECOMPRESSION AT ELBOW, POSSIBLE TRANSPOSITION;  Surgeon: Betha Loa, MD;  Location: [facility];  Service: Orthopedics;  Laterality: Right;   UPPER GASTROINTESTINAL ENDOSCOPY     UPPER LEG SOFT TISSUE BIOPSY Left 2012   lthigh    reports that he quit smoking about 13 years ago. His smoking use included cigars and cigarettes. He started smoking about 36 years ago. He has a 34.5 pack-year smoking history. He has never used smokeless tobacco. He reports that he does not currently use alcohol. He reports that he does not use drugs. family history includes Asthma in his father and sister; Cancer in his father; Diabetes in his father; Hypertension in his  sister; Leukemia in his mother; Prostate cancer in his maternal uncle; Stroke in his father. Allergies  Allergen Reactions   Amoxapine Shortness Of Breath   Chocolate Shortness Of Breath and Other (See Comments)    Asthma attack, welts   Ciprofloxacin Anaphylaxis, Dermatitis, Itching, Nausea And Vomiting, Shortness Of Breath, Swelling and Hives   Descovy [Emtricitabine-Tenofovir Af] Shortness Of Breath    Nasuea, vomiiting, diarrhea, sob, whelps, throat closes   Doxycycline Hyclate Shortness Of Breath and Swelling   Genvoya [Elviteg-Cobic-Emtricit-Tenofaf] Shortness Of Breath    Nausea, vomitting, diarreha, sob, rash, throat closing   Morphine And Codeine Shortness Of Breath, Itching and Other (See Comments)     chest pain   Other Anaphylaxis   Oxycodone-Acetaminophen Shortness Of Breath   Penicillins Shortness Of Breath and Rash    Has patient had a PCN reaction causing immediate rash, facial/tongue/throat swelling, SOB or lightheadedness with hypotension: Yes Has patient had a PCN reaction causing severe rash involving mucus membranes or skin necrosis: No Has patient had a PCN reaction that required hospitalization No Has patient had a PCN reaction occurring within the last 10 years: Yes If all of the above answers are "NO", then may proceed with Cephalosporin use.   Sulfa Antibiotics Anaphylaxis and Other (See Comments)   Tomato Shortness Of Breath   Vancomycin Shortness Of Breath and Other (See Comments)   Darunavir Itching   Amlodipine Other (See Comments)    swelling   Carvedilol     LEG SWELLING, DIZZNESS   Flagyl [Metronidazole] Itching   Meloxicam    Milk-Related Compounds    Morphine Other (See Comments)   Oxycodone Hcl Other (See Comments)   Penicillin G Other (See Comments)   Toradol [Ketorolac Tromethamine] Nausea And Vomiting    itching   Acetaminophen Rash and Other (See Comments)    Pt tolerates percocet and also plain APAP without rash   Tramadol Itching,  Nausea And Vomiting and Other (See Comments)   Current Outpatient Medications on File Prior to Visit  Medication Sig Dispense Refill   albuterol (PROVENTIL) (2.5 MG/3ML) 0.083% nebulizer solution Take 3 mLs (2.5 mg total) by nebulization every 6 (six) hours as needed for wheezing or shortness of breath. 75 mL 3   albuterol (VENTOLIN HFA) 108 (90 Base) MCG/ACT inhaler Inhale 2 puffs into the lungs every 6 (six) hours as needed for wheezing or shortness of breath. 36 g 3   ALPRAZolam (XANAX) 0.5 MG tablet TAKE 1 TABLET(0.5 MG) BY MOUTH TWICE DAILY AS NEEDED FOR SLEEP 60 tablet 2   azelastine (ASTELIN) 0.1 % nasal spray Place 2 sprays into both nostrils in the morning. Use in each nostril as directed  BELBUCA 300 MCG FILM STATES NOT USING BECAUSE THEY DONT WORK     BREZTRI AEROSPHERE 160-9-4.8 MCG/ACT AERO INHALE 2 PUFFS INTO THE LUNGS IN THE MORNING AND AT BEDTIME 10.7 g 3   cabotegravir & rilpivirine ER (CABENUVA) 600 & 900 MG/3ML injection Inject into the muscle once a month for two months then every other month 6 mL 0   cetirizine (ZYRTEC) 10 MG tablet TAKE 1 TABLET(10 MG) BY MOUTH DAILY 90 tablet 3   Cholecalciferol (VITAMIN D3) 250 MCG (10000 UT) capsule Take 1 tablet by mouth daily. [employer]     diphenhydrAMINE (BENADRYL) 25 MG tablet Take 1 tablet (25 mg total) by mouth every 6 (six) hours as needed. (Patient taking differently: Take 25 mg by mouth every 6 (six) hours as needed for sleep, itching or allergies.) 30 tablet 0   dorzolamide (TRUSOPT) 2 % ophthalmic solution Place 1 drop into both eyes 2 (two) times daily.     EPINEPHrine 0.3 mg/0.3 mL IJ SOAJ injection Inject 0.3 mg into the muscle as needed for anaphylaxis. 1 each 5   famotidine (PEPCID) 40 MG tablet Take 1 tablet (40 mg total) by mouth at bedtime. 90 tablet 3   gabapentin (NEURONTIN) 300 MG capsule TAKE 1 CAPSULE(300 MG) BY MOUTH FOUR TIMES DAILY AS NEEDED FOR PAIN 120 capsule 4   Glucos-Chond-Hyal Ac-Ca Fructo (MOVE  FREE JOINT HEALTH ADVANCE PO) Take 1 tablet by mouth daily.     HYDROcodone-acetaminophen (NORCO/VICODIN) 5-325 MG tablet 1-2 tabs PO q6 hours prn pain 20 tablet 0   HYDROmorphone (DILAUDID) 2 MG tablet Take 2 mg by mouth 2 (two) times daily as needed.     hydrOXYzine (ATARAX) 50 MG tablet TAKE 1 TABLET(50 MG) BY MOUTH TWICE DAILY 180 tablet 0   meclizine (ANTIVERT) 25 MG tablet Take 1 tablet (25 mg total) by mouth daily as needed. 40 tablet 5   methocarbamol (ROBAXIN) 500 MG tablet Take 1-2 tablets (500-1,000 mg total) by mouth every 6 (six) hours as needed for muscle spasms. 30 tablet 2   metoprolol succinate (TOPROL-XL) 100 MG 24 hr tablet Take 1 tablet (100 mg total) by mouth daily. Please call office to schedule an appt for further refills. Thank you 30 tablet 3   montelukast (SINGULAIR) 10 MG tablet Take 10 mg by mouth daily.     Multiple Vitamin (MULTIVITAMIN WITH MINERALS) TABS tablet Take 1 tablet by mouth daily after breakfast.      mupirocin ointment (BACTROBAN) 2 % APPLY EXTERNALLY TO THE AFFECTED AREA TWICE DAILY FOR 3 WEEKS, PLACE COTTON BETWEEN TOES TO ALLOW BETTER AERATION AS NEEDED (Patient taking differently: Apply 1 application  topically daily as needed (affected toes).) 66 g 1   naloxone (NARCAN) nasal spray 4 mg/0.1 mL Spray the contents of 1 device into 1 nostril. Call 911. May repeat with 2nd device in alternate nostril if no response in 2-3 minutes.     ondansetron (ZOFRAN-ODT) 4 MG disintegrating tablet Take 1 tablet (4 mg total) by mouth every 8 (eight) hours as needed for nausea or vomiting. (Patient taking differently: Take 4 mg by mouth every 4 (four) hours as needed for nausea or vomiting.) 30 tablet 2   OVER THE COUNTER MEDICATION Take 2 capsules by mouth daily. Zhou: thyroid supplement     pantoprazole (PROTONIX) 40 MG tablet Take 1 tablet (40 mg total) by mouth 2 (two) times daily before a meal. 180 tablet 3   QNASL 80 MCG/ACT AERS USE 2 SPRAYS IN  EACH NOSTRIL DAILY  (Patient taking differently: Place 2 sprays into both nostrils in the morning and at bedtime.) 10.6 g 2   QULIPTA 30 MG TABS TAKE 1 TABLET(30 MG) BY MOUTH DAILY 30 tablet 5   rosuvastatin (CRESTOR) 20 MG tablet TAKE 1 TABLET(20 MG) BY MOUTH DAILY 90 tablet 3   tizanidine (ZANAFLEX) 6 MG capsule Take 1 capsule (6 mg total) by mouth 3 (three) times daily. 90 capsule 3   torsemide (DEMADEX) 20 MG tablet Take 10 mg by mouth See admin instructions. Tuesday Thursday Saturday     Ubrogepant (UBRELVY) 100 MG TABS Take 1 tablet (100 mg total) by mouth as needed (take 1 tablet at onset of migraine. may take an additional tablet 2 hrs later if needed (Do not exceed more than 2 tablets in 24 hour period)). 16 tablet 5   metoCLOPramide (REGLAN) 5 MG tablet Take 1 tablet (5 mg total) by mouth 4 (four) times daily -  before meals and at bedtime. (Patient not taking: Reported on 09/04/2023) 120 tablet 3   nitroGLYCERIN (NITROSTAT) 0.4 MG SL tablet Place 1 tablet (0.4 mg total) under the tongue every 5 (five) minutes as needed for chest pain. 30 tablet 1   [DISCONTINUED] zolpidem (AMBIEN) 5 MG tablet Take 1-2 tablets (5-10 mg total) by mouth at bedtime as needed for sleep. (Patient not taking: Reported on 04/23/2019) 30 tablet 2   Current Facility-Administered Medications on File Prior to Visit  Medication Dose Route Frequency Provider Last Rate Last Admin   cabotegravir & rilpivirine ER (CABENUVA) 600 & 900 MG/3ML injection 1 kit  1 kit Intramuscular Q2 months             ROS:  All others reviewed and negative.  Objective        PE:  BP 128/76 (BP Location: Left Arm, Patient Position: Sitting, Cuff Size: Normal)   Pulse 70   Temp 98.1 F (36.7 C) (Oral)   Ht 5\' 9"  (1.753 m)   Wt 233 lb (105.7 kg)   SpO2 97%   BMI 34.41 kg/m                 Constitutional: Pt appears fatigued, mild ill               HENT: Head: NCAT.                Right Ear: External ear normal.                 Left Ear: External ear  normal.                Eyes: . Pupils are equal, round, and reactive to light. Conjunctivae and EOM are normal               Nose: without d/c or deformity               Neck: Neck supple. Gross normal ROM               Cardiovascular: Normal rate and regular rhythm.                 Pulmonary/Chest: Effort normal and breath sounds without rales or wheezing.                Abd:  Soft, NT, ND, + BS, no organomegaly               Neurological: Pt is alert. At baseline orientation, motor grossly  intact               Skin: Skin is warm. No rashes, no other new lesions, LE edema - none               Psychiatric: Pt behavior is normal without agitation   Micro: none  Cardiac tracings I have personally interpreted today:  none  Pertinent Radiological findings (summarize): none   Lab Results  Component Value Date   WBC 5.6 09/04/2023   HGB 13.1 09/04/2023   HCT 40.2 09/04/2023   PLT 161.0 09/04/2023   GLUCOSE 99 09/04/2023   CHOL 123 06/11/2023   TRIG 90 06/11/2023   HDL 57 06/11/2023   LDLCALC 49 06/11/2023   ALT 28 09/04/2023   AST 27 09/04/2023   NA 140 09/04/2023   K 3.5 09/04/2023   CL 106 09/04/2023   CREATININE 1.20 09/04/2023   BUN 10 09/04/2023   CO2 28 09/04/2023   TSH 2.51 08/14/2022   PSA 0.00 (L) 08/14/2022   INR 1.0 06/23/2021   HGBA1C <4.2 08/15/2022   MICROALBUR 16.88 (H) 10/05/2011   Assessment/Plan:  Casey Brennan is a 63 y.o. American [place] or [place] Native [3] Asian [4] Black or African American [2] White or Caucasian [1] male with  has a past medical history of Asthma, Chronic anemia, Chronic folliculitis, Chronic kidney disease, COPD (chronic obstructive pulmonary disease) (HCC), Dyspnea, Environmental and seasonal allergies, Glaucoma, H/O pericarditis, Headache(784.0), Hiatal hernia, History of gastric ulcer, History of kidney stones, History of MRSA infection (2010), HIV (human immunodeficiency virus infection) (HCC) (1988), Hypertension, Hypertensive  cardiomyopathy (HCC), Lytic bone lesion of hip, Nausea & vomiting (09/04/2023), 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  Nausea & vomiting Has abd u/s tomorrow per pt to ro ascites; may have possible uti per pt per renal last Thursday - awaiting culture results and any need for tx per Dr Allena Katz  Today for acute abd series, cbc, bmp  Encounter for well adult exam with abnormal findings Age and sex appropriate education and counseling updated with regular exercise and diet Referrals for preventative services - none needed Immunizations addressed - for covid,, tdap, and flu shot at pharmacy Smoking counseling  - none needed Evidence for depression or other mood disorder - none significant Most recent labs reviewed. I have personally reviewed and have noted: 1) the patient's medical and social history 2) The patient's current medications and supplements 3) The patient's height, weight, and BMI have been recorded in the chart   Essential hypertension BP Readings from Last 3 Encounters:  09/04/23 128/76  08/09/23 (!) 150/91  07/18/23 124/88   Stable, pt to continue medical treatment toprol xl 100 every day,     Mild persistent asthma, uncomplicated Stable, cont in haler prn  Mixed hyperlipidemia Lab Results  Component Value Date   LDLCALC 49 06/11/2023   Stable, pt to continue current statin crestor 20 qd   Vitamin D deficiency Last vitamin D Lab Results  Component Value Date   VD25OH 27.12 (L) 08/14/2022   Low, to start oral replacement  Followup: Return in about 6 months (around 03/06/2024).  Oliver Barre, MD 09/06/2023 8:06 PM [facility]

## 2023-09-04 NOTE — Assessment & Plan Note (Signed)
 Has abd u/s tomorrow per pt to ro ascites; may have possible uti per pt per renal last Thursday - awaiting culture results and any need for tx per Dr Allena Katz  Today for acute abd series, cbc, bmp

## 2023-09-04 NOTE — Patient Instructions (Signed)
 Please continue all other medications as before, including the zofran ODT as needed  Please have the pharmacy call with any other refills you may need.  Please continue your efforts at being more active, low cholesterol diet, and weight control.  You are otherwise up to date with prevention measures today.  Please keep your appointments with your specialists as you may have planned  Please go to the XRAY Department in the first floor for the x-ray testing  Please go to the LAB at the blood drawing area for the tests to be done  You will be contacted by phone if any changes need to be made immediately.  Otherwise, you will receive a letter about your results with an explanation, but please check with MyChart first.  Please make an Appointment to return in 6 months, or sooner if needed

## 2023-09-04 NOTE — Telephone Encounter (Signed)
 Called the patient's sister back. There was no answer. Went to VM. I have an opening at 10:30 am with Dr Vickey Huger that we could possibly see the pt if think needs to be evaluated. She can call back and discuss concerns and symptoms in more detail as well.

## 2023-09-05 ENCOUNTER — Other Ambulatory Visit (HOSPITAL_COMMUNITY): Payer: Self-pay

## 2023-09-05 ENCOUNTER — Encounter: Payer: Self-pay | Admitting: Internal Medicine

## 2023-09-05 ENCOUNTER — Other Ambulatory Visit: Payer: Self-pay

## 2023-09-05 ENCOUNTER — Ambulatory Visit (HOSPITAL_COMMUNITY)
Admission: RE | Admit: 2023-09-05 | Discharge: 2023-09-05 | Disposition: A | Discharge status: Home / Self Care | Payer: 59 | Source: Ambulatory Visit | Attending: Internal Medicine | Admitting: Internal Medicine

## 2023-09-05 DIAGNOSIS — R140 Abdominal distension (gaseous): Secondary | ICD-10-CM | POA: Diagnosis not present

## 2023-09-05 DIAGNOSIS — R188 Other ascites: Secondary | ICD-10-CM | POA: Diagnosis not present

## 2023-09-05 DIAGNOSIS — Z9049 Acquired absence of other specified parts of digestive tract: Secondary | ICD-10-CM | POA: Diagnosis not present

## 2023-09-05 LAB — CBC WITH DIFFERENTIAL/PLATELET
Basophils Absolute: 0 10*3/uL (ref 0.0–0.1)
Basophils Relative: 0.7 % (ref 0.0–3.0)
Eosinophils Absolute: 0.3 10*3/uL (ref 0.0–0.7)
Eosinophils Relative: 6.2 % — ABNORMAL HIGH (ref 0.0–5.0)
HCT: 40.2 % (ref 39.0–52.0)
Hemoglobin: 13.1 g/dL (ref 13.0–17.0)
Lymphocytes Relative: 44.8 % (ref 12.0–46.0)
Lymphs Abs: 2.5 10*3/uL (ref 0.7–4.0)
MCHC: 32.7 g/dL (ref 30.0–36.0)
MCV: 90.1 fl (ref 78.0–100.0)
Monocytes Absolute: 0.5 10*3/uL (ref 0.1–1.0)
Monocytes Relative: 9.7 % (ref 3.0–12.0)
Neutro Abs: 2.2 10*3/uL (ref 1.4–7.7)
Neutrophils Relative %: 38.6 % — ABNORMAL LOW (ref 43.0–77.0)
Platelets: 161 10*3/uL (ref 150.0–400.0)
RBC: 4.46 Mil/uL (ref 4.22–5.81)
RDW: 15.3 % (ref 11.5–15.5)
WBC: 5.6 10*3/uL (ref 4.0–10.5)

## 2023-09-05 LAB — HEPATIC FUNCTION PANEL
ALT: 28 U/L (ref 0–53)
AST: 27 U/L (ref 0–37)
Albumin: 4.1 g/dL (ref 3.5–5.2)
Alkaline Phosphatase: 101 U/L (ref 39–117)
Bilirubin, Direct: 0.2 mg/dL (ref 0.0–0.3)
Total Bilirubin: 0.5 mg/dL (ref 0.2–1.2)
Total Protein: 7.1 g/dL (ref 6.0–8.3)

## 2023-09-05 LAB — BASIC METABOLIC PANEL
BUN: 10 mg/dL (ref 6–23)
CO2: 28 meq/L (ref 19–32)
Calcium: 9.3 mg/dL (ref 8.4–10.5)
Chloride: 106 meq/L (ref 96–112)
Creatinine, Ser: 1.2 mg/dL (ref 0.40–1.50)
GFR: 65.84 mL/min (ref >60.00)
Glucose, Bld: 99 mg/dL (ref 70–99)
Potassium: 3.5 meq/L (ref 3.5–5.1)
Sodium: 140 meq/L (ref 135–145)

## 2023-09-05 LAB — LIPASE: Lipase: 77 U/L — ABNORMAL HIGH (ref 11.0–59.0)

## 2023-09-05 NOTE — Progress Notes (Signed)
 The test results show that your current treatment is OK, as the tests are essentially stable.  Please continue the same plan.  The xray is still pending  There is no other need for change of treatment or further evaluation based on these results, at this time.  thanks

## 2023-09-06 ENCOUNTER — Encounter: Payer: Self-pay | Admitting: Internal Medicine

## 2023-09-06 DIAGNOSIS — M47812 Spondylosis without myelopathy or radiculopathy, cervical region: Secondary | ICD-10-CM | POA: Diagnosis not present

## 2023-09-06 NOTE — Assessment & Plan Note (Signed)
 Stable , cont inhaler prn

## 2023-09-06 NOTE — Assessment & Plan Note (Signed)
Last vitamin D Lab Results  Component Value Date   VD25OH 27.12 (L) 08/14/2022   Low, to start oral replacement  

## 2023-09-06 NOTE — Assessment & Plan Note (Signed)
 Age and sex appropriate education and counseling updated with regular exercise and diet Referrals for preventative services - none needed Immunizations addressed - for covid,, tdap, and flu shot at pharmacy Smoking counseling  - none needed Evidence for depression or other mood disorder - none significant Most recent labs reviewed. I have personally reviewed and have noted: 1) the patient's medical and social history 2) The patient's current medications and supplements 3) The patient's height, weight, and BMI have been recorded in the chart

## 2023-09-06 NOTE — Assessment & Plan Note (Signed)
 Lab Results  Component Value Date   LDLCALC 49 06/11/2023   Stable, pt to continue current statin crestor 20 qd

## 2023-09-06 NOTE — Assessment & Plan Note (Signed)
 BP Readings from Last 3 Encounters:  09/04/23 128/76  08/09/23 (!) 150/91  07/18/23 124/88   Stable, pt to continue medical treatment toprol xl 100 every day,

## 2023-09-07 ENCOUNTER — Emergency Department (HOSPITAL_COMMUNITY)

## 2023-09-07 ENCOUNTER — Encounter (HOSPITAL_COMMUNITY): Payer: Self-pay

## 2023-09-07 ENCOUNTER — Encounter: Payer: Self-pay | Admitting: Internal Medicine

## 2023-09-07 ENCOUNTER — Emergency Department (HOSPITAL_COMMUNITY)
Admission: EM | Admit: 2023-09-07 | Discharge: 2023-09-07 | Disposition: A | Discharge status: Home / Self Care | Attending: Emergency Medicine | Admitting: Emergency Medicine

## 2023-09-07 DIAGNOSIS — J449 Chronic obstructive pulmonary disease, unspecified: Secondary | ICD-10-CM | POA: Diagnosis not present

## 2023-09-07 DIAGNOSIS — Z981 Arthrodesis status: Secondary | ICD-10-CM | POA: Diagnosis not present

## 2023-09-07 DIAGNOSIS — R42 Dizziness and giddiness: Secondary | ICD-10-CM | POA: Diagnosis not present

## 2023-09-07 DIAGNOSIS — Z79899 Other long term (current) drug therapy: Secondary | ICD-10-CM | POA: Insufficient documentation

## 2023-09-07 DIAGNOSIS — R531 Weakness: Secondary | ICD-10-CM | POA: Diagnosis not present

## 2023-09-07 DIAGNOSIS — R918 Other nonspecific abnormal finding of lung field: Secondary | ICD-10-CM | POA: Diagnosis not present

## 2023-09-07 DIAGNOSIS — I720 Aneurysm of carotid artery: Secondary | ICD-10-CM | POA: Diagnosis not present

## 2023-09-07 DIAGNOSIS — I671 Cerebral aneurysm, nonruptured: Secondary | ICD-10-CM | POA: Diagnosis not present

## 2023-09-07 DIAGNOSIS — M542 Cervicalgia: Secondary | ICD-10-CM | POA: Diagnosis not present

## 2023-09-07 DIAGNOSIS — N390 Urinary tract infection, site not specified: Secondary | ICD-10-CM

## 2023-09-07 DIAGNOSIS — I10 Essential (primary) hypertension: Secondary | ICD-10-CM | POA: Diagnosis not present

## 2023-09-07 DIAGNOSIS — Z8546 Personal history of malignant neoplasm of prostate: Secondary | ICD-10-CM | POA: Insufficient documentation

## 2023-09-07 DIAGNOSIS — R519 Headache, unspecified: Secondary | ICD-10-CM | POA: Diagnosis not present

## 2023-09-07 DIAGNOSIS — I4729 Other ventricular tachycardia: Secondary | ICD-10-CM | POA: Diagnosis not present

## 2023-09-07 DIAGNOSIS — Z21 Asymptomatic human immunodeficiency virus [HIV] infection status: Secondary | ICD-10-CM | POA: Diagnosis not present

## 2023-09-07 LAB — CBC
HCT: 41.3 % (ref 39.0–52.0)
Hemoglobin: 13.7 g/dL (ref 13.0–17.0)
MCH: 29.3 pg (ref 26.0–34.0)
MCHC: 33.2 g/dL (ref 30.0–36.0)
MCV: 88.2 fL (ref 80.0–100.0)
Platelets: 201 10*3/uL (ref 150–400)
RBC: 4.68 MIL/uL (ref 4.22–5.81)
RDW: 13.9 % (ref 11.5–15.5)
WBC: 5.3 10*3/uL (ref 4.0–10.5)
nRBC: 0 % (ref 0.0–0.2)

## 2023-09-07 LAB — URINALYSIS, ROUTINE W REFLEX MICROSCOPIC
Bilirubin Urine: NEGATIVE
Glucose, UA: NEGATIVE mg/dL
Hgb urine dipstick: NEGATIVE
Ketones, ur: NEGATIVE mg/dL
Leukocytes,Ua: NEGATIVE
Nitrite: POSITIVE — AB
Protein, ur: >=300 mg/dL — AB
Specific Gravity, Urine: 1.027 (ref 1.005–1.030)
pH: 6 (ref 5.0–8.0)

## 2023-09-07 LAB — BASIC METABOLIC PANEL
Anion gap: 12 (ref 5–15)
BUN: 8 mg/dL (ref 6–20)
CO2: 23 mmol/L (ref 22–32)
Calcium: 9.3 mg/dL (ref 8.9–10.3)
Chloride: 106 mmol/L (ref 98–111)
Creatinine, Ser: 1.01 mg/dL (ref 0.61–1.24)
GFR, Estimated: >60 mL/min (ref >60)
Glucose, Bld: 126 mg/dL — ABNORMAL HIGH (ref 70–99)
Potassium: 3.9 mmol/L (ref 3.5–5.1)
Sodium: 141 mmol/L (ref 135–145)

## 2023-09-07 LAB — TROPONIN I (HIGH SENSITIVITY): Troponin I (High Sensitivity): <2 ng/L (ref <18)

## 2023-09-07 LAB — MAGNESIUM: Magnesium: 2.1 mg/dL (ref 1.7–2.4)

## 2023-09-07 LAB — CBG MONITORING, ED: Glucose-Capillary: 124 mg/dL — ABNORMAL HIGH (ref 70–99)

## 2023-09-07 MED ORDER — PROCHLORPERAZINE EDISYLATE 10 MG/2ML IJ SOLN
10.0000 mg | Freq: Once | INTRAMUSCULAR | Status: AC
  Administered 2023-09-07: 10 mg via INTRAVENOUS
  Filled 2023-09-07: qty 2

## 2023-09-07 MED ORDER — SODIUM CHLORIDE 0.9 % IV SOLN
1.0000 g | Freq: Once | INTRAVENOUS | Status: AC
  Administered 2023-09-07: 1 g via INTRAVENOUS
  Filled 2023-09-07: qty 10

## 2023-09-07 MED ORDER — ONDANSETRON HCL 4 MG/2ML IJ SOLN
4.0000 mg | Freq: Once | INTRAMUSCULAR | Status: AC
  Administered 2023-09-07: 4 mg via INTRAVENOUS
  Filled 2023-09-07: qty 2

## 2023-09-07 MED ORDER — HYDROMORPHONE HCL 1 MG/ML IJ SOLN
1.0000 mg | Freq: Once | INTRAMUSCULAR | Status: AC
  Administered 2023-09-07: 1 mg via INTRAVENOUS
  Filled 2023-09-07: qty 1

## 2023-09-07 MED ORDER — DIPHENHYDRAMINE HCL 50 MG/ML IJ SOLN
25.0000 mg | Freq: Once | INTRAMUSCULAR | Status: AC
  Administered 2023-09-07: 25 mg via INTRAVENOUS
  Filled 2023-09-07: qty 1

## 2023-09-07 MED ORDER — IOHEXOL 350 MG/ML SOLN
75.0000 mL | Freq: Once | INTRAVENOUS | Status: AC | PRN
  Administered 2023-09-07: 75 mL via INTRAVENOUS

## 2023-09-07 MED ORDER — CEFADROXIL 500 MG PO CAPS: 500.0000 mg | ORAL_CAPSULE | Freq: Two times a day (BID) | ORAL | 0 refills | Status: DC

## 2023-09-07 NOTE — ED Triage Notes (Signed)
 Pt had ablation yesterday at [facility] of C1 and C2 nerves, since has been having generalized weakness, headache, dizziness and hypertension

## 2023-09-07 NOTE — Discharge Instructions (Signed)
 You have been evaluated for your symptoms.  Your headache and neck pain is likely a result of side effect from recent neck surgery.  It should improve in the next several days.  You also have signs of an nonsustained V. tach on cardiac monitor.  Follow-up closely with your cardiologist for outpatient evaluation.  Your urine shows signs of infection.  Take antibiotic as prescribed.  Return if you have any concern.

## 2023-09-07 NOTE — ED Provider Notes (Signed)
 [place] EMERGENCY DEPARTMENT AT [facility] Provider Note   CSN: [id number] Arrival date & time: 09/07/23  0210     History  Chief Complaint  Patient presents with   Hypertension    Casey Brennan is a 63 y.o. male history of HIV, COPD, hypertension, chronic neck pain, prostate cancer, chronic neuropathy presented for neck pain, headache, dizziness and hypertension that began in the late afternoon.  Patient went to go see [facility] and had ablation done at the [facility] and immediately after the procedure was done patient states he has felt dizzy.  Patient states he feels that the room is spinning but also feels unstable.  Patient has not had any falls and denies any chest pain or shortness of breath.  Patient states his blood pressure is usually well-controlled and so for his blood pressure to be in the 160s systolically is new.  Patient takes 100 mg of metoprolol at night and has not missed any doses.  Patient denies any paresthesias or new onset weakness.  Patient states he has chronic neck pain and he believes that is the neck pain he is having now but that the headache is new and feels that comes up from his neck and stays in the posterior aspect of his head.   Home Medications Prior to Admission medications   Medication Sig Start Date End Date Taking? Authorizing Provider  albuterol (PROVENTIL) (2.5 MG/3ML) 0.083% nebulizer solution Take 3 mLs (2.5 mg total) by nebulization every 6 (six) hours as needed for wheezing or shortness of breath. 01/25/22  Yes Ginnie Smart, MD  albuterol (VENTOLIN HFA) 108 (90 Base) MCG/ACT inhaler Inhale 2 puffs into the lungs every 6 (six) hours as needed for wheezing or shortness of breath. 05/09/23  Yes Corwin Levins, MD  ALPRAZolam Prudy Feeler) 0.5 MG tablet TAKE 1 TABLET(0.5 MG) BY MOUTH TWICE DAILY AS NEEDED FOR SLEEP 08/15/23  Yes Dohmeier, Porfirio Mylar, MD  azelastine (ASTELIN) 0.1 % nasal spray Place 2 sprays into both nostrils in the  morning. Use in each nostril as directed   Yes [provider]  BELBUCA 300 MCG FILM STATES NOT USING BECAUSE THEY DONT WORK 05/21/23  Yes [provider]  BREZTRI AEROSPHERE 160-9-4.8 MCG/ACT AERO INHALE 2 PUFFS INTO THE LUNGS IN THE MORNING AND AT BEDTIME 03/29/23  Yes Oretha Milch, MD  cabotegravir & rilpivirine ER (CABENUVA) 600 & 900 MG/3ML injection Inject into the muscle once a month for two months then every other month 09/04/23  Yes Ginnie Smart, MD  cetirizine (ZYRTEC) 10 MG tablet TAKE 1 TABLET(10 MG) BY MOUTH DAILY 03/22/23  Yes Corwin Levins, MD  Cholecalciferol (VITAMIN D3) 250 MCG (10000 UT) capsule Take 1 tablet by mouth daily. [employer] 04/14/21  Yes [provider]  diphenhydrAMINE (BENADRYL) 25 MG tablet Take 1 tablet (25 mg total) by mouth every 6 (six) hours as needed. Patient taking differently: Take 25 mg by mouth every 6 (six) hours as needed for sleep, itching or allergies. 04/11/21  Yes Venter, Margaux, PA-C  dorzolamide (TRUSOPT) 2 % ophthalmic solution Place 1 drop into both eyes 2 (two) times daily. 06/07/22  Yes [provider]  EPINEPHrine 0.3 mg/0.3 mL IJ SOAJ injection Inject 0.3 mg into the muscle as needed for anaphylaxis. 03/24/22  Yes Corwin Levins, MD  famotidine (PEPCID) 40 MG tablet Take 1 tablet (40 mg total) by mouth at bedtime. 04/19/23  Yes Unk Lightning, PA  gabapentin (NEURONTIN) 300  MG capsule TAKE 1 CAPSULE(300 MG) BY MOUTH FOUR TIMES DAILY AS NEEDED FOR PAIN 01/01/23  Yes Dohmeier, Porfirio Mylar, MD  Glucos-Chond-Hyal Ac-Ca Fructo (MOVE FREE JOINT HEALTH ADVANCE PO) Take 1 tablet by mouth daily.   Yes [provider]  HYDROcodone-acetaminophen (NORCO/VICODIN) 5-325 MG tablet 1-2 tabs PO q6 hours prn pain 08/09/23  Yes Betha Loa, MD  HYDROmorphone (DILAUDID) 2 MG tablet Take 2 mg by mouth 2 (two) times daily as needed. 03/23/23  Yes [provider]  hydrOXYzine (ATARAX) 50 MG tablet TAKE 1  TABLET(50 MG) BY MOUTH TWICE DAILY 07/12/23  Yes Patwardhan, Manish J, MD  meclizine (ANTIVERT) 25 MG tablet Take 1 tablet (25 mg total) by mouth daily as needed. 08/24/23  Yes Corwin Levins, MD  methocarbamol (ROBAXIN) 500 MG tablet Take 1-2 tablets (500-1,000 mg total) by mouth every 6 (six) hours as needed for muscle spasms. 12/01/22  Yes Estill Bamberg, MD  metoprolol succinate (TOPROL-XL) 100 MG 24 hr tablet Take 1 tablet (100 mg total) by mouth daily. Please call office to schedule an appt for further refills. Thank you 05/23/23  Yes Patwardhan, Manish J, MD  montelukast (SINGULAIR) 10 MG tablet Take 10 mg by mouth daily. 11/15/20  Yes [provider]  Multiple Vitamin (MULTIVITAMIN WITH MINERALS) TABS tablet Take 1 tablet by mouth daily after breakfast.    Yes [provider]  mupirocin ointment (BACTROBAN) 2 % APPLY EXTERNALLY TO THE AFFECTED AREA TWICE DAILY FOR 3 WEEKS, PLACE COTTON BETWEEN TOES TO ALLOW BETTER AERATION AS NEEDED Patient taking differently: Apply 1 application  topically daily as needed (affected toes). 04/20/21  Yes Ginnie Smart, MD  nitroGLYCERIN (NITROSTAT) 0.4 MG SL tablet Place 1 tablet (0.4 mg total) under the tongue every 5 (five) minutes as needed for chest pain. 02/08/22 09/07/23 Yes Patwardhan, Manish J, MD  ondansetron (ZOFRAN-ODT) 4 MG disintegrating tablet Take 1 tablet (4 mg total) by mouth every 8 (eight) hours as needed for nausea or vomiting. Patient taking differently: Take 4 mg by mouth every 4 (four) hours as needed for nausea or vomiting. 07/17/22  Yes Burns, Bobette Mo, MD  OVER THE COUNTER MEDICATION Take 2 capsules by mouth daily. Zhou: thyroid supplement   Yes [provider]  pantoprazole (PROTONIX) 40 MG tablet Take 1 tablet (40 mg total) by mouth 2 (two) times daily before a meal. 11/08/22  Yes Iva Boop, MD  QNASL 80 MCG/ACT AERS USE 2 SPRAYS IN [facility] NOSTRIL DAILY Patient taking differently: Place 2 sprays into both  nostrils in the morning and at bedtime. 11/16/22  Yes Corwin Levins, MD  QULIPTA 30 MG TABS TAKE 1 TABLET(30 MG) BY MOUTH DAILY 05/21/23  Yes Dohmeier, Porfirio Mylar, MD  rosuvastatin (CRESTOR) 20 MG tablet TAKE 1 TABLET(20 MG) BY MOUTH DAILY 08/15/23  Yes Corwin Levins, MD  tizanidine (ZANAFLEX) 6 MG capsule Take 1 capsule (6 mg total) by mouth 3 (three) times daily. 08/24/23  Yes Corwin Levins, MD  torsemide (DEMADEX) 20 MG tablet Take 10 mg by mouth See admin instructions. Tuesday Thursday Saturday   Yes [provider]  Ubrogepant (UBRELVY) 100 MG TABS Take 1 tablet (100 mg total) by mouth as needed (take 1 tablet at onset of migraine. may take an additional tablet 2 hrs later if needed (Do not exceed more than 2 tablets in 24 hour period)). 03/22/23  Yes Dohmeier, Porfirio Mylar, MD  metoCLOPramide (REGLAN) 5 MG tablet Take 1 tablet (5 mg total) by mouth  4 (four) times daily -  before meals and at bedtime. Patient not taking: Reported on 09/04/2023 07/31/23   Beverley Fiedler, MD  naloxone [facility]) nasal spray 4 mg/0.1 mL Spray the contents of 1 device into 1 nostril. Call 911. May repeat with 2nd device in alternate nostril if no response in 2-3 minutes. Patient not taking: Reported on 09/07/2023 08/17/23   [provider]  zolpidem (AMBIEN) 5 MG tablet Take 1-2 tablets (5-10 mg total) by mouth at bedtime as needed for sleep. Patient not taking: Reported on 04/23/2019 12/21/16 05/19/19  Kathlynn Grate, DO      Allergies    Amoxapine, Chocolate, Ciprofloxacin, Descovy [emtricitabine-tenofovir af], Doxycycline hyclate, Genvoya [elviteg-cobic-emtricit-tenofaf], Morphine and codeine, Other, Oxycodone-acetaminophen, Penicillins, Sulfa antibiotics, Tomato, Vancomycin, Darunavir, Amlodipine, Carvedilol, Flagyl [metronidazole], Meloxicam, Milk-related compounds, Morphine, Oxycodone hcl, Penicillin g, Toradol [ketorolac tromethamine], Acetaminophen, and Tramadol    Review of Systems   Review of  Systems  Physical Exam Updated Vital Signs BP (!) 142/90   Pulse 65   Temp 97.8 F (36.6 C) (Oral)   Resp 19   SpO2 95%  Physical Exam Vitals reviewed.  Constitutional:      General: He is not in acute distress. HENT:     Head: Normocephalic and atraumatic.  Eyes:     Extraocular Movements: Extraocular movements intact.     Conjunctiva/sclera: Conjunctivae normal.     Pupils: Pupils are equal, round, and reactive to light.     Comments: No nystagmus noted  Cardiovascular:     Rate and Rhythm: Normal rate and regular rhythm.     Pulses: Normal pulses.     Heart sounds: Normal heart sounds.     Comments: 2+ bilateral radial/dorsalis pedis pulses with regular rate Pulmonary:     Effort: Pulmonary effort is normal. No respiratory distress.     Breath sounds: Normal breath sounds.  Abdominal:     Palpations: Abdomen is soft.     Tenderness: There is no abdominal tenderness. There is no guarding or rebound.  Musculoskeletal:        General: Normal range of motion.     Cervical back: Normal range of motion and neck supple.     Comments: 5 out of 5 bilateral grip/leg extension strength  Skin:    General: Skin is warm and dry.     Capillary Refill: Capillary refill takes less than 2 seconds.  Neurological:     General: No focal deficit present.     Mental Status: He is alert and oriented to person, place, and time.     Sensory: Sensation is intact.     Motor: Motor function is intact.     Coordination: Coordination is intact.     Comments: Sensation intact in all 4 limbs Cranial nerves III through XII intact Vision grossly intact  Psychiatric:        Mood and Affect: Mood normal.     ED Results / Procedures / Treatments   Labs (all labs ordered are listed, but only abnormal results are displayed) Labs Reviewed  BASIC METABOLIC PANEL - Abnormal; Notable for the following components:      Result Value   Glucose, Bld 126 (*)    All other components within normal limits   CBG MONITORING, ED - Abnormal; Notable for the following components:   Glucose-Capillary 124 (*)    All other components within normal limits  CBC  URINALYSIS, ROUTINE W REFLEX MICROSCOPIC  MAGNESIUM  BRAIN NATRIURETIC PEPTIDE  TROPONIN  I (HIGH SENSITIVITY)    EKG EKG Interpretation Date/Time:  Friday September 07 2023 02:30:21 EST Ventricular Rate:  75 PR Interval:  184 QRS Duration:  91 QT Interval:  402 QTC Calculation: 449 R Axis:   43  Text Interpretation: Sinus rhythm Probable left atrial enlargement Minimal ST elevation, anterior leads No significant change was found Confirmed by Drema Pry [phone]) on 09/07/2023 4:36:30 AM  Radiology US Abdomen Complete Result Date: 09/07/2023 CLINICAL DATA:  63 year old male with abdominal distension. Query ascites. EXAM: ABDOMEN ULTRASOUND COMPLETE COMPARISON:  Abdominal radiographs 09/04/2023. CT Abdomen and Pelvis 10/09/2022. FINDINGS: Gallbladder: Surgically absent. Common bile duct: Diameter: 4 mm, normal. Liver: No focal lesion identified. Within normal limits in parenchymal echogenicity. Portal vein is patent on color Doppler imaging with normal direction of blood flow towards the liver. IVC: No abnormality visualized. Pancreas: Visualized portion unremarkable. Spleen: Size and appearance within normal limits. Right Kidney: Length: 10.5 cm. Echogenicity within normal limits. No solid mass or hydronephrosis visualized. Left Kidney: Length: 10.0 cm. Echogenicity within normal limits. No solid mass or hydronephrosis visualized. Abdominal aorta: No aneurysm visualized. Other findings: None. IMPRESSION: Negative for ascites. Negative ultrasound appearance of the abdomen status post cholecystectomy. Electronically Signed   By: Odessa Fleming M.D.   On: 09/07/2023 05:13   CT Angio Head Neck W WO CM Result Date: 09/07/2023 CLINICAL DATA:  63 year old male with headache, dizziness after spine procedure. EXAM: CT ANGIOGRAPHY HEAD AND NECK WITH AND WITHOUT  CONTRAST TECHNIQUE: Multidetector CT imaging of the head and neck was performed using the standard protocol during bolus administration of intravenous contrast. Multiplanar CT image reconstructions and MIPs were obtained to evaluate the vascular anatomy. Carotid stenosis measurements (when applicable) are obtained utilizing NASCET criteria, using the distal internal carotid diameter as the denominator. RADIATION DOSE REDUCTION: This exam was performed according to the departmental dose-optimization program which includes automated exposure control, adjustment of the mA and/or kV according to patient size and/or use of iterative reconstruction technique. CONTRAST:  75mL OMNIPAQUE IOHEXOL 350 MG/ML SOLN COMPARISON:  Brain MRI, Head CT 12/06/2022. FINDINGS: CT HEAD Brain: Cerebral volume is stable, within normal limits for age. No midline shift, ventriculomegaly, mass effect, evidence of mass lesion, intracranial hemorrhage or evidence of cortically based acute infarction. Gray-white matter differentiation is within normal limits throughout the brain. Calvarium and skull base: Stable, intact. Paranasal sinuses: Visualized paranasal sinuses and mastoids are stable and well aerated. Orbits: Stable orbit and scalp soft tissues. CTA NECK Skeleton: Anterior and posterior cervical spine fusion hardware C3 through T1. Underlying vertebral lucency, possibly artifact due to imaging technique today. But hardware loosening difficult to exclude. Maintained cervical and upper thoracic vertebral height and alignment. Absent dentition. Upper chest: Negative. Other neck: Postoperative appearing soft tissue gas in the posterior suboccipital and cervical vertebral musculature asymmetric to the left (series 15, image 179). Otherwise neck soft tissue spaces are within normal limits. Aortic arch: 3 vessel arch mostly visible. Right carotid system: Patent with no significant atherosclerosis or stenosis. Left carotid system: Patent with no  significant atherosclerosis or stenosis. Vertebral arteries: Proximal subclavian arteries and vertebral artery origins are normal. Codominant vertebral arteries, some detail in the neck is obscured from extensive spinal hardware artifact. But both vessels appear patent to the skull base. No stenosis identified. CTA HEAD Posterior circulation: Patent distal vertebral arteries and vertebrobasilar junction with no plaque or stenosis. Left V4 segment somewhat dominant. Both PICA origins appear patent. Patent basilar artery without stenosis. Normal SCA and PCA origins.  Posterior communicating arteries are diminutive or absent. Bilateral PCA branches are within normal limits. Anterior circulation: Both ICA siphons are patent. Left siphon is normal. Right ICA siphon remarkable for supraclinoid segment mixed fusiform and saccular aneurysm measuring 6-7 mm long axis (series 15, image 110 and series 16, image 99). See also series 17, image 99. Carotid termini are patent. MCA and ACA origins are normal. Anterior communicating artery is normal. Bilateral ACA branches are within normal limits. Left MCA M1 segment and bifurcation are patent without stenosis. Right MCA M1 segment and trifurcation are patent without stenosis. Bilateral MCA branches are within normal limits. Venous sinuses: Early contrast timing, grossly patent. Anatomic variants: Mildly dominant left vertebral artery. Review of the MIP images confirms the above findings IMPRESSION: 1. 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 Neuro endovascular (Neuro-Interventional Radiology or endovascular neurosurgery) follow-up recommended to evaluate the appropriateness of potential treatment. 2. Otherwise negative CTA Head and Neck; no significant atherosclerosis or stenosis. 3. Stable and normal for age CT appearance of the brain. 4. Extensive anterior and posterior cervical spine, cervicothoracic junction fusion. Small volume  probably postprocedural soft tissue gas in the upper neck. The appearance of significant underlying vertebral lucency on this exam might be artifact, hardware loosening not excluded. Electronically Signed   By: Odessa Fleming M.D.   On: 09/07/2023 05:09    Procedures .Critical Care  Performed by: Netta Corrigan, PA-C Authorized by: Netta Corrigan, PA-C   Critical care provider statement:    Critical care time (minutes):  30   Critical care time was exclusive of:  Separately billable procedures and treating other patients   Critical care was necessary to treat or prevent imminent or life-threatening deterioration of the following conditions: Nonsustained V. tach.   Critical care was time spent personally by me on the following activities:  Blood draw for specimens, development of treatment plan with patient or surrogate, discussions with consultants, evaluation of patient's response to treatment, examination of patient, obtaining history from patient or surrogate, review of old charts, re-evaluation of patient's condition, pulse oximetry, ordering and review of radiographic studies, ordering and review of laboratory studies and ordering and performing treatments and interventions   I assumed direction of critical care for this patient from another provider in my specialty: no     Care discussed with: admitting provider       Medications Ordered in ED Medications  iohexol (OMNIPAQUE) 350 MG/ML injection 75 mL (75 mLs Intravenous Contrast Given 09/07/23 0426)  prochlorperazine (COMPAZINE) injection 10 mg (10 mg Intravenous Given 09/07/23 0648)  diphenhydrAMINE (BENADRYL) injection 25 mg (25 mg Intravenous Given 09/07/23 4540)    ED Course/ Medical Decision Making/ A&P                                 Medical Decision Making Amount and/or Complexity of Data Reviewed Labs: ordered. Radiology: ordered.  Risk Prescription drug management.   Leafy Half 63 y.o. presented today for dizziness.  Working DDx that I considered at this time includes, but not limited to, CVA/TIA, arrhythmia, CNS lesion, BPPV, vestibular labyrinthitis, Meniere's, Ramsey-Hunt, polypharmacy, orthostatic hypotension, electrolyte abnormalities.  R/o DDx: pending  Review of prior external notes: 09/04/2023 office visit  Unique Tests and My Independent Interpretation:  EKG: Sinus 75 bpm, no ST elevations or depressions noted CBC: Unremarkable BMP: Unremarkable CBG: 124 UA: pending CTA head and neck  with without contrast:unruptured 7 mm mixed saccular/fusiform right ICA aneurysm Troponin: pending BNP: pending Mag: pending  Social Determinants of Health: none  Discussion with Independent Historian:  Family member  Discussion of Management of Tests:  Meghan, NP Neurosurgery; Anne Fu, MD Cardiology  Risk: High: hospitalization or escalation of hospital-level care  Risk Stratification Score: none  Staffed with [place], MD  Plan: On exam patient was no acute distress but mildly hypertensive at 166/103. Physical exam showed no acute findings.  Patient did have ablation done earlier today at [facility] and states that symptoms began immediately after the procedure was over.  Concerned the patient may have vertebral artery dissection and will obtain labs and imaging to further evaluate.  CTA shows unruptured 7 mm mixed saccular/fusiform right ICA aneurysm.  I spoke to neurosurgery and they state patient to follow-up with Dr. Conchita Paris in the office as this is most likely an incidental finding.  When I went to go speak to the patient and his wife wife states that patient went to V. tach 45 minutes prior that was nonsustained.  Upon review of patient's cardiac monitor patient did appear to go into 14 beats of nonsustained V. tach that was monomorphic.  Patient states he does have some chest discomfort and so we will obtain labs and electrolytes and reach out to cardiology as patient will likely need  admission.  Cardiology does not recommend admission for the NVT.  Cardiology recommends following up with labs and electrolytes if patient is asymptomatic and follow-up outpatient as he most likely goes into nonsustained VT due to his cardiomyopathy.  Patient signed out to [place], [place].  Please review their note for the continuation of patient's care.  The plan at this point is follow up on labs.  This chart was dictated using voice recognition software.  Despite best efforts to proofread,  errors can occur which can change the documentation meaning.         Final Clinical Impression(s) / ED Diagnoses Final diagnoses:  Carotid aneurysm, right (HCC)  Nonsustained ventricular tachycardia [facility])    Rx / DC Orders ED Discharge Orders     None         Belinda, [place] 09/07/23 [id number]    Nira Conn, MD 09/07/23 [phone]

## 2023-09-07 NOTE — ED Provider Notes (Signed)
 Had ablation of C2-C3 by neurosurgery last night.  Now having headache, neck pain, throbbing.  No neuro deficit.  CTA shows incidental aneurysm.  Pt however now having non sustain V-tach.  Had similar hx.  V-tach was captured on EKG.  He did report some chest discomfort during this episode.   Cards recommend checking labs and if its normal he can be discharge to f/u with cards and neurosurgery.  Suspect vtach from cardiomyopathy.  If pt syncopized then admit.     -Labs ordered, independently viewed and interpreted by me.  Labs remarkable for UA consistent with UTI.  Rocephin given.   -The patient was maintained on a cardiac monitor.  I personally viewed and interpreted the cardiac monitored which showed an underlying rhythm of: NSR -Imaging independently viewed and interpreted by me and I agree with radiologist's interpretation.  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 Finding discussed with neurosurgery -This patient presents to the ED for concern of headache/neck pain, this involves an extensive number of treatment options, and is a complaint that carries with it a high risk of complications and morbidity.  The differential diagnosis includes post surgical complication, aneurysm, UTI, MI, dissection, infection, asthma -Co morbidities that complicate the patient evaluation includes HIV, COPD, Hepatitis,  -Treatment includes rocephin, dilaudid, zofran -Reevaluation of the patient after these medicines showed that the patient improved -PCP office notes or outside notes reviewed -Discussion with specialist neurosurgery and cardiology -Escalation to admission/observation considered: patients feels much better, is comfortable with discharge, and will follow up with neurosurgery and cardiology -Prescription medication considered, patient comfortable with duracef -Social Determinant of Health considered    BP (!) 140/75   Pulse 65   Temp 97.8 F (36.6  C) (Oral)   Resp 12   Ht 5\' 9"  (1.753 m)   Wt 100.7 kg   SpO2 93%   BMI 32.78 kg/m   Results for orders placed or performed during the hospital encounter of 09/07/23  CBG monitoring, ED   Collection Time: 09/07/23  2:43 AM  Result Value Ref Range   Glucose-Capillary 124 (H) 70 - 99 mg/dL  Basic metabolic panel   Collection Time: 09/07/23  2:46 AM  Result Value Ref Range   Sodium 141 135 - 145 mmol/L   Potassium 3.9 3.5 - 5.1 mmol/L   Chloride 106 98 - 111 mmol/L   CO2 23 22 - 32 mmol/L   Glucose, Bld 126 (H) 70 - 99 mg/dL   BUN 8 6 - 20 mg/dL   Creatinine, Ser 6.96 0.61 - 1.24 mg/dL   Calcium 9.3 8.9 - 29.5 mg/dL   GFR, Estimated >28 >41 mL/min   Anion gap 12 5 - 15  CBC   Collection Time: 09/07/23  2:46 AM  Result Value Ref Range   WBC 5.3 4.0 - 10.5 K/uL   RBC 4.68 4.22 - 5.81 MIL/uL   Hemoglobin 13.7 13.0 - 17.0 g/dL   HCT 32.4 40.1 - 02.7 %   MCV 88.2 80.0 - 100.0 fL   MCH 29.3 26.0 - 34.0 pg   MCHC 33.2 30.0 - 36.0 g/dL   RDW 25.3 66.4 - 40.3 %   Platelets 201 150 - 400 K/uL   nRBC 0.0 0.0 - 0.2 %  Magnesium   Collection Time: 09/07/23  6:34 AM  Result Value Ref Range   Magnesium 2.1 1.7 - 2.4 mg/dL  Troponin I (High Sensitivity)   Collection Time: 09/07/23  6:34 AM  Result Value Ref Range   Troponin I (High Sensitivity) <2 <18 ng/L  Urinalysis, Routine w reflex microscopic -Urine, Clean Catch   Collection Time: 09/07/23  7:36 AM  Result Value Ref Range   Color, Urine YELLOW YELLOW   APPearance HAZY (A) CLEAR   Specific Gravity, Urine 1.027 1.005 - 1.030   pH 6.0 5.0 - 8.0   Glucose, UA NEGATIVE NEGATIVE mg/dL   Hgb urine dipstick NEGATIVE NEGATIVE   Bilirubin Urine NEGATIVE NEGATIVE   Ketones, ur NEGATIVE NEGATIVE mg/dL   Protein, ur >=161 (A) NEGATIVE mg/dL   Nitrite POSITIVE (A) NEGATIVE   Leukocytes,Ua NEGATIVE NEGATIVE   RBC / HPF 0-5 0 - 5 RBC/hpf   WBC, UA 11-20 0 - 5 WBC/hpf   Bacteria, UA MANY (A) NONE SEEN   Squamous Epithelial / HPF 0-5  0 - 5 /HPF   Mucus PRESENT    Hyaline Casts, UA PRESENT    *Note: Due to a large number of results and/or encounters for the requested time period, some results have not been displayed. A complete set of results can be found in Results Review.   DG Chest Port 1 View Result Date: 09/07/2023 CLINICAL DATA:  Weakness, headache, dizziness status post cervical nerve ablation EXAM: PORTABLE CHEST 1 VIEW COMPARISON:  Chest radiograph dated 09/04/2023 FINDINGS: Normal lung volumes. Left basilar patchy opacity. Blunting of the left costophrenic angle. No pneumothorax. Enlarged cardiomediastinal silhouette is likely projectional. No acute osseous abnormality. Partially imaged cervical spinal fixation hardware appears intact. IMPRESSION: 1. Left basilar patchy opacity, which may represent atelectasis, aspiration, or pneumonia. 2. Blunting of the left costophrenic angle, which may represent a small pleural effusion. Electronically Signed   By: Agustin Cree M.D.   On: 09/07/2023 09:57   US Abdomen Complete Result Date: 09/07/2023 CLINICAL DATA:  63 year old male with abdominal distension. Query ascites. EXAM: ABDOMEN ULTRASOUND COMPLETE COMPARISON:  Abdominal radiographs 09/04/2023. CT Abdomen and Pelvis 10/09/2022. FINDINGS: Gallbladder: Surgically absent. Common bile duct: Diameter: 4 mm, normal. Liver: No focal lesion identified. Within normal limits in parenchymal echogenicity. Portal vein is patent on color Doppler imaging with normal direction of blood flow towards the liver. IVC: No abnormality visualized. Pancreas: Visualized portion unremarkable. Spleen: Size and appearance within normal limits. Right Kidney: Length: 10.5 cm. Echogenicity within normal limits. No solid mass or hydronephrosis visualized. Left Kidney: Length: 10.0 cm. Echogenicity within normal limits. No solid mass or hydronephrosis visualized. Abdominal aorta: No aneurysm visualized. Other findings: None. IMPRESSION: Negative for ascites. Negative  ultrasound appearance of the abdomen status post cholecystectomy. Electronically Signed   By: Odessa Fleming M.D.   On: 09/07/2023 05:13   DG Abd Acute W/Chest Result Date: 09/07/2023 CLINICAL DATA:  63 year old male with abdominal pain, nausea vomiting. EXAM: DG ABDOMEN ACUTE WITH 1 VIEW CHEST COMPARISON:  Chest radiographs 07/18/2023 and earlier. FINDINGS: PA chest on 09/04/2023. Lung volumes and mediastinal contours appear stable. Visualized tracheal air column is within normal limits. Partially visible cervicothoracic junction spinal fusion hardware. No pneumothorax, pulmonary edema, pleural effusion, confluent lung opacity, pneumoperitoneum. Upright and supine views of the abdomen and pelvis. No pneumoperitoneum. Non obstructed bowel gas pattern. Cholecystectomy clips. Incidental pelvic phleboliths. Dextroconvex thoracolumbar scoliosis. No acute osseous abnormality identified. IMPRESSION: 1. No acute cardiopulmonary abnormality. 2. Non obstructed bowel gas pattern. No pneumoperitoneum. Chronic cholecystectomy. Electronically Signed   By: Odessa Fleming M.D.   On: 09/07/2023 05:12   CT Angio Head Neck W WO CM Result Date: 09/07/2023 CLINICAL DATA:  63 year old male  with headache, dizziness after spine procedure. EXAM: CT ANGIOGRAPHY HEAD AND NECK WITH AND WITHOUT CONTRAST TECHNIQUE: Multidetector CT imaging of the head and neck was performed using the standard protocol during bolus administration of intravenous contrast. Multiplanar CT image reconstructions and MIPs were obtained to evaluate the vascular anatomy. Carotid stenosis measurements (when applicable) are obtained utilizing NASCET criteria, using the distal internal carotid diameter as the denominator. RADIATION DOSE REDUCTION: This exam was performed according to the departmental dose-optimization program which includes automated exposure control, adjustment of the mA and/or kV according to patient size and/or use of iterative reconstruction technique. CONTRAST:   75mL OMNIPAQUE IOHEXOL 350 MG/ML SOLN COMPARISON:  Brain MRI, Head CT 12/06/2022. FINDINGS: CT HEAD Brain: Cerebral volume is stable, within normal limits for age. No midline shift, ventriculomegaly, mass effect, evidence of mass lesion, intracranial hemorrhage or evidence of cortically based acute infarction. Gray-white matter differentiation is within normal limits throughout the brain. Calvarium and skull base: Stable, intact. Paranasal sinuses: Visualized paranasal sinuses and mastoids are stable and well aerated. Orbits: Stable orbit and scalp soft tissues. CTA NECK Skeleton: Anterior and posterior cervical spine fusion hardware C3 through T1. Underlying vertebral lucency, possibly artifact due to imaging technique today. But hardware loosening difficult to exclude. Maintained cervical and upper thoracic vertebral height and alignment. Absent dentition. Upper chest: Negative. Other neck: Postoperative appearing soft tissue gas in the posterior suboccipital and cervical vertebral musculature asymmetric to the left (series 15, image 179). Otherwise neck soft tissue spaces are within normal limits. Aortic arch: 3 vessel arch mostly visible. Right carotid system: Patent with no significant atherosclerosis or stenosis. Left carotid system: Patent with no significant atherosclerosis or stenosis. Vertebral arteries: Proximal subclavian arteries and vertebral artery origins are normal. Codominant vertebral arteries, some detail in the neck is obscured from extensive spinal hardware artifact. But both vessels appear patent to the skull base. No stenosis identified. CTA HEAD Posterior circulation: Patent distal vertebral arteries and vertebrobasilar junction with no plaque or stenosis. Left V4 segment somewhat dominant. Both PICA origins appear patent. Patent basilar artery without stenosis. Normal SCA and PCA origins. Posterior communicating arteries are diminutive or absent. Bilateral PCA branches are within normal  limits. Anterior circulation: Both ICA siphons are patent. Left siphon is normal. Right ICA siphon remarkable for supraclinoid segment mixed fusiform and saccular aneurysm measuring 6-7 mm long axis (series 15, image 110 and series 16, image 99). See also series 17, image 99. Carotid termini are patent. MCA and ACA origins are normal. Anterior communicating artery is normal. Bilateral ACA branches are within normal limits. Left MCA M1 segment and bifurcation are patent without stenosis. Right MCA M1 segment and trifurcation are patent without stenosis. Bilateral MCA branches are within normal limits. Venous sinuses: Early contrast timing, grossly patent. Anatomic variants: Mildly dominant left vertebral artery. Review of the MIP images confirms the above findings IMPRESSION: 1. 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 Neuro endovascular (Neuro-Interventional Radiology or endovascular neurosurgery) follow-up recommended to evaluate the appropriateness of potential treatment. 2. Otherwise negative CTA Head and Neck; no significant atherosclerosis or stenosis. 3. Stable and normal for age CT appearance of the brain. 4. Extensive anterior and posterior cervical spine, cervicothoracic junction fusion. Small volume probably postprocedural soft tissue gas in the upper neck. The appearance of significant underlying vertebral lucency on this exam might be artifact, hardware loosening not excluded. Electronically Signed   By: Odessa Fleming M.D.   On: 09/07/2023 05:09  Fayrene Helper, PA-C 09/07/23 1051    Linwood Dibbles, MD 09/08/23 [phone]

## 2023-09-07 NOTE — ED Provider Triage Note (Signed)
 Emergency Medicine Provider Triage Evaluation Note  Casey Brennan , a 63 y.o. male  was evaluated in triage.  Pt complains of neck pain, headache, dizziness and hypertension that began in the late afternoon.  Patient went to go see [facility] and had ablation done at the C2-C3 and immediately after the procedure was done patient states he has felt dizzy.  Patient states he feels that the room is spinning but also feels unstable.  Patient has not had any falls and denies any chest pain or shortness of breath.  Patient states his blood pressure is usually well-controlled and so for his blood pressure to be in the 160s systolically is new.  Patient takes 100 mg of metoprolol at night and has not missed any doses.  Patient denies any paresthesias or new onset weakness.  Patient states he has chronic neck pain and he believes that is the neck pain he is having now but that the headache is new and feels that comes up from his neck and stays in the posterior aspect of his head.  Review of Systems  Positive:  Negative:   Physical Exam  BP (!) 166/103   Pulse 77   Temp 97.8 F (36.6 C) (Oral)   Resp 18   SpO2 99%  Gen:   Awake, no distress   Resp:  Normal effort  MSK:   Moves extremities without difficulty  Other:  Reassuring finger-to-nose, no nystagmus noted, equal smile, no slurring of words, 5 out of 5 bilateral grip strength, hip flexion, knee extension/flexion  Medical Decision Making  Medically screening exam initiated at 2:44 AM.  Appropriate orders placed.  Casey Brennan was informed that the remainder of the evaluation will be completed by another provider, this initial triage assessment does not replace that evaluation, and the importance of remaining in the ED until their evaluation is complete.  Workup initiated, given patient's recent procedure and having symptoms immediately after we will get CTA to rule out vertebral dissection or injury   [name], [name],  PA-C 09/07/23 0246

## 2023-09-08 ENCOUNTER — Encounter (HOSPITAL_BASED_OUTPATIENT_CLINIC_OR_DEPARTMENT_OTHER): Payer: Self-pay | Admitting: Pulmonary Disease

## 2023-09-10 ENCOUNTER — Telehealth: Payer: Self-pay | Admitting: *Deleted

## 2023-09-10 ENCOUNTER — Ambulatory Visit: Payer: 59

## 2023-09-10 NOTE — Telephone Encounter (Signed)
**Note De-identified  [place] Obfuscation** Please advise 

## 2023-09-10 NOTE — Telephone Encounter (Signed)
 Copied from CRM [id number]. Topic: Appointments - Scheduling Inquiry for Clinic >> Sep 10, 2023  1:20 PM Suzette B wrote: Reason for CRM: patient's sister, April called and stated the patient had recently had an aneurism and needed to reschedule his appointment for the injection. Please call patient at [phone]   Received in Error sent to wrong office from E2C2 Forwarded back to [place].

## 2023-09-11 ENCOUNTER — Telehealth: Payer: Self-pay

## 2023-09-11 DIAGNOSIS — R29898 Other symptoms and signs involving the musculoskeletal system: Secondary | ICD-10-CM | POA: Diagnosis not present

## 2023-09-11 DIAGNOSIS — M79641 Pain in right hand: Secondary | ICD-10-CM | POA: Diagnosis not present

## 2023-09-11 DIAGNOSIS — M25641 Stiffness of right hand, not elsewhere classified: Secondary | ICD-10-CM | POA: Diagnosis not present

## 2023-09-11 NOTE — Transitions of Care (Post Inpatient/ED Visit) (Signed)
 09/11/2023  Name: Casey Brennan MRN: [id number] DOB: 04/09/1961  Today's TOC FU Call Status: Today's TOC FU Call Status:: Successful TOC FU Call Completed TOC FU Call Complete Date: 09/11/23 Patient's Name and Date of Birth confirmed.  Transition Care Management Follow-up Telephone Call Date of Discharge: 09/10/23 Discharge Facility: [facility]) Type of Discharge: Emergency Department How have you been since you were released from the hospital?: Same Any questions or concerns?: No  Items Reviewed: Did you receive and understand the discharge instructions provided?: Yes Medications obtained,verified, and reconciled?: Yes (Medications Reviewed) Any new allergies since your discharge?: No Dietary orders reviewed?: Yes Do you have support at home?: Yes People in Home: sibling(s)  Medications Reviewed Today: Medications Reviewed Today     Reviewed by Karena Addison, LPN (Licensed Practical Nurse) on 09/11/23 at 1123  Med List Status: <None>   Medication Order Taking? Sig Documenting Provider Last Dose Status Informant  albuterol (PROVENTIL) (2.5 MG/3ML) 0.083% nebulizer solution [id number] No Take 3 mLs (2.5 mg total) by nebulization every 6 (six) hours as needed for wheezing or shortness of breath. Ginnie Smart, MD Taking Active Family Member, Pharmacy Records  albuterol (VENTOLIN HFA) 108 [phone] Base) MCG/ACT inhaler [id number] No Inhale 2 puffs into the lungs every 6 (six) hours as needed for wheezing or shortness of breath. Corwin Levins, MD Taking Active Family Member, Pharmacy Records  ALPRAZolam Prudy Feeler) 0.5 MG tablet [id number] No TAKE 1 TABLET(0.5 MG) BY MOUTH TWICE DAILY AS NEEDED FOR SLEEP Dohmeier, Porfirio Mylar, MD 09/06/2023 Bedtime Active Family Member, Pharmacy Records  azelastine (ASTELIN) 0.1 % nasal spray [id number] No Place 2 sprays into both nostrils in the morning. Use in each nostril as directed [provider] Taking Active Family Member, Pharmacy Records   BELBUCA 300 MCG FILM [id number] No STATES NOT USING BECAUSE THEY DONT WORK [provider] 09/06/2023 Bedtime Active Family Member, Pharmacy Records  BREZTRI AEROSPHERE 160-9-4.8 MCG/ACT AERO [id number] No INHALE 2 PUFFS INTO THE LUNGS IN THE MORNING AND AT BEDTIME Oretha Milch, MD 09/06/2023 Bedtime Active Family Member, Pharmacy Records  cabotegravir & rilpivirine ER (CABENUVA) 600 & 900 MG/3ML injection [id number] No Inject into the muscle once a month for two months then every other month Ginnie Smart, MD Taking Active Family Member, Pharmacy Records           Med Note [place], DUROJAHYE' R   Fri Sep 07, 2023  5:27 AM) Next injection on the 09/10/2023  cabotegravir & rilpivirine ER (CABENUVA) 600 & 900 MG/3ML injection 1 kit [id number]   Ginnie Smart, MD  Active   cefadroxil (DURICEF) 500 MG capsule [id number]  Take 1 capsule (500 mg total) by mouth 2 (two) times daily. Fayrene Helper, PA-C  Active   cetirizine (ZYRTEC) 10 MG tablet [id number] No TAKE 1 TABLET(10 MG) BY MOUTH DAILY Corwin Levins, MD 09/06/2023 Bedtime Active Family Member, Pharmacy Records  Cholecalciferol (VITAMIN D3) 250 MCG (10000 UT) capsule [id number] No Take 1 tablet by mouth daily. [employer] [provider] 09/06/2023 Bedtime Active Family Member, Pharmacy Records  diphenhydrAMINE (BENADRYL) 25 MG tablet [id number] No Take 1 tablet (25 mg total) by mouth every 6 (six) hours as needed.  Patient taking differently: Take 25 mg by mouth every 6 (six) hours as needed for sleep, itching or allergies.   Tanda Rockers, PA-C Taking Active Family Member, Pharmacy Records  dorzolamide (TRUSOPT) 2 % ophthalmic solution [id number] No Place 1 drop into both eyes 2 (two) times daily.  [provider] 09/06/2023 Bedtime Active Family Member, Pharmacy Records  EPINEPHrine 0.3 mg/0.3 mL IJ SOAJ injection [id number] No Inject 0.3 mg into the muscle as needed for anaphylaxis. Corwin Levins, MD Taking Active Family  Member, Pharmacy Records  famotidine (PEPCID) 40 MG tablet [id number] No Take 1 tablet (40 mg total) by mouth at bedtime. Hyacinth Meeker [place], [place] 09/06/2023 Bedtime Active Family Member, Pharmacy Records  gabapentin (NEURONTIN) 300 MG capsule [id number] No TAKE 1 CAPSULE(300 MG) BY MOUTH FOUR TIMES DAILY AS NEEDED FOR PAIN Dohmeier, Porfirio Mylar, MD 09/06/2023 Bedtime Active Family Member, Pharmacy Records  Glucos-Chond-Hyal Ac-Ca Fructo (MOVE FREE JOINT HEALTH ADVANCE PO) [id number] No Take 1 tablet by mouth daily. [provider] 09/06/2023 Bedtime Active Family Member, Pharmacy Records  HYDROcodone-acetaminophen (NORCO/VICODIN) 5-325 MG tablet [id number] No 1-2 tabs PO q6 hours prn pain Betha Loa, MD Taking Active Family Member, Pharmacy Records  HYDROmorphone (DILAUDID) 2 MG tablet [id number] No Take 2 mg by mouth 2 (two) times daily as needed. [provider] Taking Active Family Member, Pharmacy Records  hydrOXYzine (ATARAX) 50 MG tablet [id number] No TAKE 1 TABLET(50 MG) BY MOUTH TWICE DAILY Patwardhan, Anabel Bene, MD 09/06/2023 Bedtime Active Family Member, Pharmacy Records  meclizine (ANTIVERT) 25 MG tablet [id number] No Take 1 tablet (25 mg total) by mouth daily as needed. Corwin Levins, MD Taking Active Family Member, Pharmacy Records  methocarbamol (ROBAXIN) 500 MG tablet [id number] No Take 1-2 tablets (500-1,000 mg total) by mouth every 6 (six) hours as needed for muscle spasms. Estill Bamberg, MD Taking Active Family Member, Pharmacy Records  metoCLOPramide [facility]) 5 MG tablet [id number] No Take 1 tablet (5 mg total) by mouth 4 (four) times daily -  before meals and at bedtime.  Patient not taking: Reported on 09/04/2023   Beverley Fiedler, MD Not Taking Active Family Member, Pharmacy Records  metoprolol succinate (TOPROL-XL) 100 MG 24 hr tablet [id number] No Take 1 tablet (100 mg total) by mouth daily. Please call office to schedule an appt for further refills. Thank you Elder Negus, MD  09/07/2023 12:00 AM Active Family Member, Pharmacy Records  montelukast (SINGULAIR) 10 MG tablet [id number] No Take 10 mg by mouth daily. [provider] 09/06/2023 Bedtime Active Family Member, Pharmacy Records  Multiple Vitamin (MULTIVITAMIN WITH MINERALS) TABS tablet [id number] No Take 1 tablet by mouth daily after breakfast.  [provider] 09/06/2023 Bedtime Active Family Member, Pharmacy Records  mupirocin ointment (BACTROBAN) 2 % [id number] No APPLY EXTERNALLY TO THE AFFECTED AREA TWICE DAILY FOR 3 WEEKS, PLACE COTTON BETWEEN TOES TO ALLOW BETTER AERATION AS NEEDED  Patient taking differently: Apply 1 application  topically daily as needed (affected toes).   Ginnie Smart, MD Taking Active Family Member, Pharmacy Records  naloxone [facility]) nasal spray 4 mg/0.1 mL [id number] No Spray the contents of 1 device into 1 nostril. Call 911. May repeat with 2nd device in alternate nostril if no response in 2-3 minutes.  Patient not taking: Reported on 09/07/2023   [provider] Not Taking Active Family Member, Pharmacy Records  nitroGLYCERIN (NITROSTAT) 0.4 MG SL tablet [id number] No Place 1 tablet (0.4 mg total) under the tongue every 5 (five) minutes as needed for chest pain. Elder Negus, MD 05/24/2023 Expired 09/07/23 2359 Family Member, Pharmacy Records           Med Note Marilu Favre Dec 04, 2022  1:09 PM) 12/04/22: Reports during [facility] call continues to take prn- reports has not  needed recently post-recent surgery; may need new RX  ondansetron (ZOFRAN-ODT) 4 MG disintegrating tablet [id number] No Take 1 tablet (4 mg total) by mouth every 8 (eight) hours as needed for nausea or vomiting.  Patient taking differently: Take 4 mg by mouth every 4 (four) hours as needed for nausea or vomiting.   Pincus Sanes, MD Taking Active Family Member, Pharmacy Records  OVER THE COUNTER MEDICATION [id number] No Take 2 capsules by mouth daily. Zhou: thyroid supplement [provider] 09/06/2023 Bedtime Active Family Member, Pharmacy Records  pantoprazole (PROTONIX) 40 MG tablet [id number] No Take 1 tablet (40 mg total) by mouth 2 (two) times daily before a meal. Iva Boop, MD 09/06/2023 Bedtime Active Family Member, Pharmacy Records  QNASL 80 MCG/ACT AERS [id number] No USE 2 SPRAYS IN [facility] NOSTRIL DAILY  Patient taking differently: Place 2 sprays into both nostrils in the morning and at bedtime.   Corwin Levins, MD 09/06/2023 Bedtime Active Family Member, Pharmacy Records  QULIPTA 30 [place] TABS [id number] No TAKE 1 TABLET(30 MG) BY MOUTH DAILY Dohmeier, Porfirio Mylar, MD 09/06/2023 Bedtime Active Family Member, Pharmacy Records  rosuvastatin (CRESTOR) 20 MG tablet [id number] No TAKE 1 TABLET(20 MG) BY MOUTH DAILY Corwin Levins, MD 09/06/2023 Bedtime Active Family Member, Pharmacy Records  tizanidine (ZANAFLEX) 6 MG capsule [id number] No Take 1 capsule (6 mg total) by mouth 3 (three) times daily. Corwin Levins, MD 09/06/2023 Bedtime Active Family Member, Pharmacy Records  torsemide [facility]) 20 MG tablet [id number] No Take 10 mg by mouth See admin instructions. Tuesday Thursday Saturday [provider] 09/06/2023 Bedtime Active Family Member, Pharmacy Records  Ubrogepant (UBRELVY) 100 MG TABS [id number] No Take 1 tablet (100 mg total) by mouth as needed (take 1 tablet at onset of migraine. may take an additional tablet 2 hrs later if needed (Do not exceed more than 2 tablets in 24 hour period)). Dohmeier, Porfirio Mylar, MD Taking Active Family Member, Pharmacy Records  Patient not taking:  Discontinued 05/19/19 0129 Med List Note Waymond Cera, RN 01/16/17 0128): Pt's sister April Obie Dredge helps with medications               Home Care and Equipment/Supplies: Were Home Health Services Ordered?: NA Any new equipment or medical supplies ordered?: NA  Functional Questionnaire: Do you need assistance with bathing/showering or dressing?: No Do you need assistance with meal  preparation?: No Do you need assistance with eating?: No Do you have difficulty maintaining continence: No Do you need assistance with getting out of bed/getting out of a chair/moving?: No Do you have difficulty managing or taking your medications?: No  Follow up appointments reviewed: PCP Follow-up appointment confirmed?: Yes Date of PCP follow-up appointment?: 09/13/23 Follow-up Provider: [facility] Follow-up appointment confirmed?: NA Do you need transportation to your follow-up appointment?: No Do you understand care options if your condition(s) worsen?: Yes-patient verbalized understanding    SIGNATURE Karena Addison, LPN [facility] Nurse Health Advisor Direct Dial [phone]

## 2023-09-13 ENCOUNTER — Inpatient Hospital Stay: Admitting: Internal Medicine

## 2023-09-13 ENCOUNTER — Telehealth: Payer: Self-pay | Admitting: Internal Medicine

## 2023-09-13 NOTE — Telephone Encounter (Signed)
 We need to get approval for prolia before scheduling.

## 2023-09-13 NOTE — Telephone Encounter (Signed)
 Would an appt with an APP be appropriate?

## 2023-09-13 NOTE — Telephone Encounter (Signed)
 Copied from CRM [id number]. Topic: Appointments - Appointment Scheduling >> Sep 13, 2023 10:21 AM Tiffany H wrote: Patient's sister April called to schedule Prolia injection. Attempted to schedule Friday March 14th at 10:40AM but template stalled. Please secure visit at [facility].   ---  Do we have an approved PA for this pt's prolia?

## 2023-09-14 ENCOUNTER — Ambulatory Visit: Admitting: *Deleted

## 2023-09-14 DIAGNOSIS — B20 Human immunodeficiency virus [HIV] disease: Secondary | ICD-10-CM

## 2023-09-14 MED ORDER — CABOTEGRAVIR & RILPIVIRINE ER 600 & 900 MG/3ML IM SUER
1.0000 | Freq: Once | INTRAMUSCULAR | Status: AC
  Administered 2023-09-14: 1 via INTRAMUSCULAR

## 2023-09-14 NOTE — Progress Notes (Addendum)
    Patient presents for Cabenuva injections   States feeling well   Target date is 9th of each month   Last injection received 07/20/2023   Patient is within 7 day window before or after target date for injections   Unable to use VG sites 2/2 body habitus   Rilpivirine 900 mg Given IM LUOQ Patient tolerated well.   Cabotegravir 600 given IM RUOQ. Patient tolerated well.   Patient waited 10 minutes in clinic following injections without incident.  Next injections are due 11/09/2023   Angelina Ok, RN

## 2023-09-17 NOTE — Progress Notes (Signed)
    Patient presents for Cabenuva injections   States feeling well   Target date is 9th of each month   Last injection received 07/20/2023   Patient is within 7 day window before or after target date for injections   Unable to use VG sites 2/2 body habitus   Rilpivirine 900 mg Given IM LUOQ Patient tolerated well.   Cabotegravir 600 given IM RUOQ. Patient tolerated well.   Patient waited 10 minutes in clinic following injections without incident.  Next injections are due 11/09/2023   Angelina Ok, RN

## 2023-09-18 ENCOUNTER — Other Ambulatory Visit (HOSPITAL_COMMUNITY): Payer: Self-pay

## 2023-09-18 DIAGNOSIS — G5621 Lesion of ulnar nerve, right upper limb: Secondary | ICD-10-CM | POA: Diagnosis not present

## 2023-09-18 DIAGNOSIS — G5601 Carpal tunnel syndrome, right upper limb: Secondary | ICD-10-CM | POA: Diagnosis not present

## 2023-09-18 DIAGNOSIS — M25641 Stiffness of right hand, not elsewhere classified: Secondary | ICD-10-CM | POA: Diagnosis not present

## 2023-09-18 DIAGNOSIS — R29898 Other symptoms and signs involving the musculoskeletal system: Secondary | ICD-10-CM | POA: Diagnosis not present

## 2023-09-18 DIAGNOSIS — M79641 Pain in right hand: Secondary | ICD-10-CM | POA: Diagnosis not present

## 2023-09-18 NOTE — Telephone Encounter (Signed)
 Yes, that sounds good I think     thanks

## 2023-09-19 ENCOUNTER — Encounter: Payer: Self-pay | Admitting: Internal Medicine

## 2023-09-19 ENCOUNTER — Ambulatory Visit (INDEPENDENT_AMBULATORY_CARE_PROVIDER_SITE_OTHER): Payer: 59 | Admitting: Internal Medicine

## 2023-09-19 VITALS — BP 122/80 | HR 72 | Ht 69.0 in | Wt 235.0 lb

## 2023-09-19 DIAGNOSIS — K2100 Gastro-esophageal reflux disease with esophagitis, without bleeding: Secondary | ICD-10-CM

## 2023-09-19 DIAGNOSIS — R140 Abdominal distension (gaseous): Secondary | ICD-10-CM

## 2023-09-19 DIAGNOSIS — K222 Esophageal obstruction: Secondary | ICD-10-CM | POA: Diagnosis not present

## 2023-09-19 DIAGNOSIS — Z8719 Personal history of other diseases of the digestive system: Secondary | ICD-10-CM

## 2023-09-19 DIAGNOSIS — K3184 Gastroparesis: Secondary | ICD-10-CM

## 2023-09-19 DIAGNOSIS — M7522 Bicipital tendinitis, left shoulder: Secondary | ICD-10-CM | POA: Diagnosis not present

## 2023-09-19 DIAGNOSIS — K5900 Constipation, unspecified: Secondary | ICD-10-CM

## 2023-09-19 DIAGNOSIS — B20 Human immunodeficiency virus [HIV] disease: Secondary | ICD-10-CM

## 2023-09-19 MED ORDER — PRUCALOPRIDE SUCCINATE 2 MG PO TABS: 1.0000 | ORAL_TABLET | Freq: Every day | ORAL | 5 refills | Status: DC

## 2023-09-19 NOTE — Patient Instructions (Addendum)
 Stop reglan.   We have sent the following medications to your pharmacy for you to pick up at your convenience: Motegrity 2mg  one tablet by mouth once daily.   Remain on pantoprazole 40 mg twice daily.   Gastroparesis  Gastroparesis is a condition in which food takes longer than normal to empty from the stomach. This condition is also known as delayed gastric emptying. It is usually a long-term (chronic) condition. There is no cure, but there are treatments and things that you can do at home to help relieve symptoms. Treating the underlying condition that causes gastroparesis can also help relieve symptoms. What are the causes? In many cases, the cause of this condition is not known. Possible causes include: A hormone (endocrine) disorder, such as hypothyroidism or diabetes. A nervous system disease, such as Parkinson's disease or multiple sclerosis. Cancer, infection, or surgery that affects the stomach or vagus nerve. The vagus nerve runs from your chest, through your neck, and to the lower part of your brain. A connective tissue disorder, such as scleroderma. Certain medicines. What increases the risk? You are more likely to develop this condition if: You have certain disorders or diseases. These may include: An endocrine disorder. An eating disorder. Amyloidosis. Scleroderma. Parkinson's disease. Multiple sclerosis. Cancer or infection of the stomach or the vagus nerve. You have had surgery on your stomach or vagus nerve. You take certain medicines. You are male. What are the signs or symptoms? Symptoms of this condition include: Feeling full after eating very little or a loss of appetite. Nausea, vomiting, or heartburn. Bloating of your abdomen. Inconsistent blood sugar (glucose) levels on blood tests. Unexplained weight loss. Acid from the stomach coming up into the esophagus (gastroesophageal reflux). Sudden tightening (spasm) of the stomach, which can be  painful. Symptoms may come and go. Some people may not notice any symptoms. How is this diagnosed? This condition is diagnosed with tests, such as: Tests that check how long it takes food to move through the stomach and intestines. These tests include: Upper gastrointestinal (GI) series. For this test, you drink a liquid that shows up well on X-rays, and then X-rays are taken of your intestines. Gastric emptying scintigraphy. For this test, you eat food that contains a small amount of radioactive material, and then scans are taken. Wireless capsule GI monitoring system. For this test, you swallow a pill (capsule) that records information about how foods and fluid move through your stomach. Gastric manometry. For this test, a tube is passed down your throat and into your stomach to measure electrical and muscular activity. Endoscopy. For this test, a long, thin tube with a camera and light on the end is passed down your throat and into your stomach to check for problems in your stomach lining. Ultrasound. This test uses sound waves to create images of the inside of your body. This can help rule out gallbladder disease or pancreatitis as a cause of your symptoms. How is this treated? There is no cure for this condition, but treatment and home care may relieve symptoms. Treatment may include: Treating the underlying cause. Managing your symptoms by making changes to your diet and exercise habits. Taking medicines to control nausea and vomiting and to stimulate stomach muscles. Getting food through a feeding tube in the hospital. This may be done in severe cases. Having surgery to insert a device called a gastric electrical stimulator into your body. This device helps improve stomach emptying and control nausea and vomiting. Follow these instructions at  home: Take over-the-counter and prescription medicines only as told by your health care provider. Follow instructions from your health care provider  about eating or drinking restrictions. Your health care provider may recommend that you: Eat smaller meals more often. Eat low-fat foods. Eat low-fiber forms of high-fiber foods. For example, eat cooked vegetables instead of raw vegetables. Have only liquid foods instead of solid foods. Liquid foods are easier to digest. Drink enough fluid to keep your urine pale yellow. Exercise as often as told by your health care provider. Keep all follow-up visits. This is important. Contact a health care provider if you: Notice that your symptoms do not improve with treatment. Have new symptoms. Get help right away if you: Have severe pain in your abdomen that does not improve with treatment. Have nausea that is severe or does not go away. Vomit every time you drink fluids. Summary Gastroparesis is a long-term (chronic) condition in which food takes longer than normal to empty from the stomach. Symptoms include nausea, vomiting, heartburn, bloating of your abdomen, and loss of appetite. Eating smaller portions, low-fat foods, and low-fiber forms of high-fiber foods may help you manage your symptoms. Get help right away if you have severe pain in your abdomen. This information is not intended to replace advice given to you by your health care provider. Make sure you discuss any questions you have with your health care provider. Document Revised: 10/24/2019 Document Reviewed: 10/27/2019 Elsevier Patient Education  2024 [employer].  _______________________________________________________  If your blood pressure at your visit was 140/90 or greater, please contact your primary care physician to follow up on this.  _______________________________________________________  If you are age 10 or older, your body mass index should be between 23-30. Your Body mass index is 34.7 kg/m. If this is out of the aforementioned range listed, please consider follow up with your Primary Care Provider.  If you are  age 32 or younger, your body mass index should be between 19-25. Your Body mass index is 34.7 kg/m. If this is out of the aformentioned range listed, please consider follow up with your Primary Care Provider.   ________________________________________________________  The [place] [facility] providers would like to encourage you to use [facility] to communicate with providers for non-urgent requests or questions.  Due to long hold times on the telephone, sending your provider a message by [facility] may be a faster and more efficient way to get a response.  Please allow 48 business hours for a response.  Please remember that this is for non-urgent requests.  _______________________________________________________

## 2023-09-19 NOTE — Telephone Encounter (Signed)
 When the sister called to request this, I assumed we had discussed this with him and her, and he had the diagnosis of osteoporosis  Ok to let sister know, we would be unable to do the Prolia if he has not actually been diagnosed with this    thanks

## 2023-09-20 ENCOUNTER — Inpatient Hospital Stay: Admitting: Internal Medicine

## 2023-09-20 NOTE — Telephone Encounter (Signed)
 For you -

## 2023-09-21 ENCOUNTER — Other Ambulatory Visit: Payer: Self-pay | Admitting: Pulmonary Disease

## 2023-09-23 ENCOUNTER — Encounter: Payer: Self-pay | Admitting: Internal Medicine

## 2023-09-23 NOTE — Progress Notes (Signed)
 Subjective:    Patient ID: Casey Brennan, male    DOB: 1960/12/03, 63 y.o.   MRN: [id number]  HPI Casey Brennan is a 63 year old male with 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 He also has a history of COPD, prostate cancer status post prostatectomy, cervical disc disease and arthritis. He is here today with his sister.   He experiences difficulty eating and early satiety, with nausea and occasional vomiting. He describes a sensation of food regurgitation after swallowing, along with bloating and abdominal distention, describing his stomach as 'real bloated'.  He has a history of gastroparesis, confirmed by a previous EGD on May 25, 2023, which showed a large amount of food present in the stomach despite fasting. Symptoms consistent with gastroparesis include nausea and bloating. He was previously on metoclopramide (Reglan) 5 mg, but discontinued it due to tremors. He is currently not on any medication for gastroparesis.  He has a history of GERD with esophagitis and a prior benign distal esophageal stricture, which was dilated to 20 mm in November 2024. He experiences ongoing reflux symptoms and occasional dysphagia, with sensations of food impaction and regurgitation. He is on pantoprazole for acid suppression.  He underwent an ultrasound on September 07, 2023, to rule out ascites due to abdominal distention, which was negative. Recent blood work, including a CBC and hepatic function panel, were normal.  He has a history of gastric ulcer, diverticulosis, and adenomatous colon polyp. He reports constipation, requiring the use of laxatives like X-Lax or Dulcolax during the week.  He has been experiencing wheezing for more than three months and has a history of COPD and asthma. He is under the care of a pulmonologist for these conditions and  has been on steroids to manage his symptoms.  Review of Systems As per HPI, otherwise negative  Current Medications, Allergies, Past Medical History, Past Surgical History, Family History and Social History were reviewed in [employer] record.    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  Gastroparesis Chronic gastroparesis type symptoms with early satiety, nausea, and abdominal bloating. Previous EGD showed significant food retention after 12+ hour fast consistent with gastroparesis. Reglan discontinued due to tremors.  Limited pharmacologic options. Motegrity considered for gastric motility. Erythromycin unsuitable for long-term due to tachyphylaxis. - Discontinue Reglan due to tremors. - Initiate Motegrity 2 mg daily to improve gastric motility and alleviate symptoms. - Continue pantoprazole for acid reflux management. - Advise adherence to a gastroparesis-friendly diet. - Consider erythromycin for short-term use if symptoms  worsen.  Hx of GERD and  Esophageal Strictures Esophageal strictures with previous dilation. Symptoms suggest possible recurrence or persistence. Recent dilation showed mild mucosal disruption. - Monitor symptoms and consider repeat endoscopy if swallowing difficulties persist. - Evaluate for eosinophilic esophagitis if inflammation is observed during endoscopy.  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 Potential overlap with eosinophilic asthma contributing to respiratory symptoms. - Discuss with pulmonologist the possibility of eosinophilic asthma and consider treatment options like Dupixent if indicated. -If no improvement in symptoms above we should consider repeating EGD with biopsies of the esophagus and stomach to evaluate for persistent eosinophilia.  Eosinophilia had improved at last endoscopy in November 2024 but was present in May 2024.  HIV Follow-up with Dr. Ninetta Lights

## 2023-09-24 ENCOUNTER — Other Ambulatory Visit (HOSPITAL_BASED_OUTPATIENT_CLINIC_OR_DEPARTMENT_OTHER): Payer: Self-pay

## 2023-09-24 DIAGNOSIS — I671 Cerebral aneurysm, nonruptured: Secondary | ICD-10-CM | POA: Diagnosis not present

## 2023-09-24 DIAGNOSIS — M47812 Spondylosis without myelopathy or radiculopathy, cervical region: Secondary | ICD-10-CM | POA: Diagnosis not present

## 2023-09-24 MED ORDER — BREZTRI AEROSPHERE 160-9-4.8 MCG/ACT IN AERO: 2.0000 | INHALATION_SPRAY | Freq: Two times a day (BID) | RESPIRATORY_TRACT | 6 refills | Status: DC

## 2023-09-25 ENCOUNTER — Other Ambulatory Visit: Payer: Self-pay | Admitting: Neurosurgery

## 2023-09-25 DIAGNOSIS — I671 Cerebral aneurysm, nonruptured: Secondary | ICD-10-CM

## 2023-09-25 HISTORY — DX: Cerebral aneurysm, nonruptured: I67.1

## 2023-09-26 ENCOUNTER — Inpatient Hospital Stay: Admitting: Internal Medicine

## 2023-09-26 ENCOUNTER — Other Ambulatory Visit: Payer: Self-pay | Admitting: Neurosurgery

## 2023-10-01 ENCOUNTER — Encounter: Payer: Self-pay | Admitting: Cardiology

## 2023-10-01 ENCOUNTER — Ambulatory Visit: Attending: Cardiology

## 2023-10-01 ENCOUNTER — Other Ambulatory Visit: Payer: Self-pay | Admitting: Cardiology

## 2023-10-01 ENCOUNTER — Ambulatory Visit: Payer: 59 | Attending: Cardiology | Admitting: Cardiology

## 2023-10-01 VITALS — BP 148/98 | HR 67 | Resp 16 | Ht 69.0 in | Wt 232.0 lb

## 2023-10-01 DIAGNOSIS — I10 Essential (primary) hypertension: Secondary | ICD-10-CM

## 2023-10-01 DIAGNOSIS — I4729 Other ventricular tachycardia: Secondary | ICD-10-CM | POA: Diagnosis not present

## 2023-10-01 MED ORDER — METOPROLOL SUCCINATE ER 100 MG PO TB24: 100.0000 mg | ORAL_TABLET | Freq: Every day | ORAL | 3 refills | Status: DC

## 2023-10-01 MED ORDER — HYDRALAZINE HCL 50 MG PO TABS: 50.0000 mg | ORAL_TABLET | Freq: Two times a day (BID) | ORAL | 3 refills | Status: DC

## 2023-10-01 NOTE — Progress Notes (Signed)
 Cardiology Office Note:  .   Date:  10/01/2023  ID:  Leafy Half, DOB [birth year], MRN [id number] PCP: Corwin Levins, MD  [facility] Providers Cardiologist:  Truett Mainland, MD PCP: Corwin Levins, MD  Chief Complaint  Patient presents with   Hypertension     Casey Brennan is a 63 y.o. male with hypertension, hyperlipidemia, HIV, Rt ICA siphon aneurysm  Patient was recently seen send this is a hospital with complaints of dizziness and hypertension.  During the workup, CTA head/neck showed new finding of right ICA siphon aneurysm, for which he was recommended neurosurgery follow-up.  While in the ED, he was noted to have 14 beat monomorphic V. tach with some chest pain.  Patient is otherwise doing well.  Blood pressure slightly elevated.  No recurrence of chest pain since the ER visit.  Coronary CT angiogram in 2021 showed calcium score of 0 with no atherosclerosis.  Patient is scheduled to undergo cerebral angiogram by Dr. Conchita Paris in April.     Vitals:   10/01/23 1535  BP: (!) 148/98  Pulse: 67  Resp: 16  SpO2: 97%      Review of Systems  Cardiovascular:  Negative for chest pain, dyspnea on exertion, leg swelling, palpitations and syncope.        Studies Reviewed: [name]        EKG 10/01/2023: Normal sinus rhythm Normal ECG When compared with ECG of 07-Sep-2023 02:30, No significant change was found    Independently interpreted 09/2023: Chol 123, TG 90, HDL 57, LDL 49 Hb 13.7 Cr 5.28     CTA head/neck 09/2023: 1. 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 Neuro endovascular (Neuro-Interventional Radiology or endovascular neurosurgery) follow-up recommended to evaluate the appropriateness of potential treatment.   2. Otherwise negative CTA Head and Neck; no significant atherosclerosis or stenosis.   3. Stable and normal for age CT appearance of the brain.   4. Extensive anterior and  posterior cervical spine, cervicothoracic junction fusion. Small volume probably postprocedural soft tissue gas in the upper neck. The appearance of significant underlying vertebral lucency on this exam might be artifact, hardware loosening not excluded.   Echocardiogram 03/2022: Normal LV systolic function with visual EF 60-65%. Left ventricle cavity is normal in size. Mild concentric hypertrophy of the left ventricle. Normal global wall motion. Normal diastolic filling pattern, normal LAP. Calculated EF 61%. Structurally normal tricuspid valve with no regurgitation. No evidence of pulmonary hypertension. compared to echo from 2021, EF has now normalized.  CCTA 04/07/2020: 1. Coronary calcium score of 0. 2. Normal coronary origin with right dominance. 3. Normal coronary arteries.   Physical Exam Vitals and nursing note reviewed.  Constitutional:      General: He is not in acute distress. Neck:     Vascular: No JVD.  Cardiovascular:     Rate and Rhythm: Normal rate and regular rhythm.     Heart sounds: Normal heart sounds. No murmur heard. Pulmonary:     Effort: Pulmonary effort is normal.     Breath sounds: Normal breath sounds. No wheezing or rales.  Musculoskeletal:     Right lower leg: No edema.     Left lower leg: No edema.      VISIT DIAGNOSES:   ICD-10-CM   1. Essential hypertension  I10 EKG 12-Lead    ECHOCARDIOGRAM COMPLETE    LONG TERM MONITOR (3-14 DAYS)    2. NSVT (nonsustained ventricular tachycardia) (HCC)  I47.29 ECHOCARDIOGRAM COMPLETE    LONG TERM MONITOR (3-14 DAYS)     Assessment and plan:  Casey Brennan is a 63 y.o. male with hypertension, hyperlipidemia, HIV, Rt ICA siphon aneurysm   Chest pain: Concerning for angina, but no ischemic changes/infarct on the EKG today. He has had completely normal CCTA in 04/2020, making obstructive CAD highly unlikely. I wonder if he may have had coronary spasm. Will check echocardiogram and exercise  treadmill stress test.    Hypertension: Uncontrolled. Intolerant to several medications, including amlodipine, nifedipine, HCTZ, losartan, carvedilol, labetalol.  Currently on metoprolol succinate 100 mg daily. Resume hydralazine 50 mg twice daily, especially in the setting of recent diagnosis of IC aneurysm.    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 Recommend echocardiogram and 2-week ZIO monitor. Continue metoprolol succinate 100 mg daily.       Meds ordered this encounter  Medications   metoprolol succinate (TOPROL-XL) 100 MG 24 hr tablet    Sig: Take 1 tablet (100 mg total) by mouth daily. Take with or immediately following a meal.    Dispense:  90 tablet    Refill:  3   hydrALAZINE (APRESOLINE) 50 MG tablet    Sig: Take 1 tablet (50 mg total) by mouth in the morning and at bedtime.    Dispense:  180 tablet    Refill:  3     F/u in 3 months  Signed, Elder Negus, MD

## 2023-10-01 NOTE — Progress Notes (Unsigned)
 Enrolled for [employer] to mail a ZIO XT long term holter monitor to the patients address on file.

## 2023-10-01 NOTE — Patient Instructions (Signed)
 Medication Instructions:  START Hydralazine 50 mg take one tablet by mouth twice daily   Refilled metoprolol succinate   *If you need a refill on your cardiac medications before your next appointment, please call your pharmacy*  Testing/Procedures: Echo  Your physician has requested that you have an echocardiogram. Echocardiography is a painless test that uses sound waves to create images of your heart. It provides your doctor with information about the size and shape of your heart and how well your heart's chambers and valves are working. This procedure takes approximately one hour. There are no restrictions for this procedure. Please do NOT wear cologne, perfume, aftershave, or lotions (deodorant is allowed). Please arrive 15 minutes prior to your appointment time.  Please note: We ask at that you not bring children with you during ultrasound (echo/ vascular) testing. Due to room size and safety concerns, children are not allowed in the ultrasound rooms during exams. Our front office staff cannot provide observation of children in our lobby area while testing is being conducted. An adult accompanying a patient to their appointment will only be allowed in the ultrasound room at the discretion of the ultrasound technician under special circumstances. We apologize for any inconvenience.    ZIO PATCH 14 DAYS   Your physician has requested that you wear a Zio heart monitor for __14___ days. This will be mailed to your home with instructions on how to apply the monitor and how to return it when finished. Please allow 2 weeks after returning the heart monitor before our office calls you with the results.   Follow-Up: At [facility], you and your health needs are our priority.  As part of our continuing mission to provide you with exceptional heart care, our providers are all part of one team.  This team includes your primary Cardiologist (physician) and Advanced Practice Providers or APPs  (Physician Assistants and Nurse Practitioners) who all work together to provide you with the care you need, when you need it.  Your next appointment:   3 month(s)  Provider:   Elder Negus, MD     Other Instructions Christena Deem- Long Term Monitor Instructions     Your physician has requested you wear a ZIO patch monitor for 3 days.  This is a single patch monitor. [employer] supplies one patch monitor per enrollment. Additional  stickers are not available. Please do not apply patch if you will be having a Nuclear Stress Test,  Echocardiogram, Cardiac CT, MRI, or Chest Xray during the period you would be wearing the  monitor. The patch cannot be worn during these tests. You cannot remove and re-apply the  ZIO XT patch monitor.  Your ZIO patch monitor will be mailed 3 day USPS to your address on file. It may take 3-5 days  to receive your monitor after you have been enrolled.  Once you have received your monitor, please review the enclosed instructions. Your monitor  has already been registered assigning a specific monitor serial # to you.     Billing and Patient Assistance Program Information     We have supplied [employer] with any of your insurance information on file for billing purposes.  [employer] offers a sliding scale Patient Assistance Program for patients that do not have  insurance, or whose insurance does not completely cover the cost of the ZIO monitor.  You must apply for the Patient Assistance Program to qualify for this discounted rate.  To apply, please call [employer] at [phone], select  option 4, select option 2, ask to apply for  Patient Assistance Program. Meredeth Ide will ask your household income, and how many people  are in your household. They will quote your out-of-pocket cost based on that information.  [employer] will also be able to set up a 18-month, interest-free payment plan if needed.     Applying the monitor     Shave hair from upper left chest.  Hold abrader  disc by orange tab. Rub abrader in 40 strokes over the upper left chest as  indicated in your monitor instructions.  Clean area with 4 enclosed alcohol pads. Let dry.  Apply patch as indicated in monitor instructions. Patch will be placed under collarbone on left  side of chest with arrow pointing upward.  Rub patch adhesive wings for 2 minutes. Remove white label marked "1". Remove the white  label marked "2". Rub patch adhesive wings for 2 additional minutes.  While looking in a mirror, press and release button in center of patch. A small green light will  flash 3-4 times. This will be your only indicator that the monitor has been turned on.  Do not shower for the first 24 hours. You may shower after the first 24 hours.  Press the button if you feel a symptom. You will hear a small click. Record Date, Time and  Symptom in the Patient Logbook.  When you are ready to remove the patch, follow instructions on the last 2 pages of Patient  Logbook. Stick patch monitor onto the last page of Patient Logbook.  Place Patient Logbook in the blue and white box. Use locking tab on box and tape box closed  securely. The blue and white box has prepaid postage on it. Please place it in the mailbox as  soon as possible. Your physician should have your test results approximately 7 days after the  monitor has been mailed back to [facility].  Call [facility] Customer Care at [phone] if you have questions regarding  your ZIO XT patch monitor. Call them immediately if you see an orange light blinking on your  monitor.  If your monitor falls off in less than 4 days, contact our Monitor department at [phone].  If your monitor becomes loose or falls off after 4 days call [employer] at [phone] for  suggestions on securing your monitor.        1st Floor: - Lobby - Registration  - Pharmacy  - Lab - Cafe  2nd Floor: - PV Lab - Diagnostic Testing (echo, CT, nuclear med)  3rd  Floor: - Vacant  4th Floor: - TCTS (cardiothoracic surgery) - AFib Clinic - Structural Heart Clinic - Vascular Surgery  - Vascular Ultrasound  5th Floor: - HeartCare Cardiology (general and EP) - Clinical Pharmacy for coumadin, hypertension, lipid, weight-loss medications, and med management appointments    Valet parking services will be available as well.

## 2023-10-02 ENCOUNTER — Other Ambulatory Visit: Payer: Self-pay | Admitting: Internal Medicine

## 2023-10-02 DIAGNOSIS — R29898 Other symptoms and signs involving the musculoskeletal system: Secondary | ICD-10-CM | POA: Diagnosis not present

## 2023-10-02 DIAGNOSIS — M25641 Stiffness of right hand, not elsewhere classified: Secondary | ICD-10-CM | POA: Diagnosis not present

## 2023-10-02 DIAGNOSIS — M79641 Pain in right hand: Secondary | ICD-10-CM | POA: Diagnosis not present

## 2023-10-03 ENCOUNTER — Other Ambulatory Visit: Payer: Self-pay

## 2023-10-09 DIAGNOSIS — R29898 Other symptoms and signs involving the musculoskeletal system: Secondary | ICD-10-CM | POA: Diagnosis not present

## 2023-10-09 DIAGNOSIS — M25641 Stiffness of right hand, not elsewhere classified: Secondary | ICD-10-CM | POA: Diagnosis not present

## 2023-10-09 DIAGNOSIS — M79641 Pain in right hand: Secondary | ICD-10-CM | POA: Diagnosis not present

## 2023-10-12 ENCOUNTER — Telehealth: Payer: Self-pay | Admitting: Cardiology

## 2023-10-12 MED ORDER — ISOSORBIDE DINITRATE 20 MG PO TABS: 20.0000 mg | ORAL_TABLET | Freq: Three times a day (TID) | ORAL | 1 refills | Status: DC

## 2023-10-12 NOTE — Telephone Encounter (Signed)
 Pt c/o medication issue:  1. Name of Medication:   hydrALAZINE (APRESOLINE) 50 MG tablet    2. How are you currently taking this medication (dosage and times per day)?   Take 1 tablet (50 mg total) by mouth in the morning and at bedtime.    3. Are you having a reaction (difficulty breathing--STAT)?   4. What is your medication issue? Pt's sister states that since starting medication pt has had swelling, dizziness and BP continues to be elevated( 143/86 - today). She would like a c/b regarding this matter. Please advise

## 2023-10-12 NOTE — Telephone Encounter (Signed)
 Advised to hold medication until hearing back from [place]. Patient did take the medication this morning. Confirmed they are monitoring BPs in the mornings and evenings.  Aware forwarding to MD for advisement.  Advised to call office after hours/over the weekend if we are not able to call back today w/ MD recommendation. Patient verbalized understanding and agreeable to plan.

## 2023-10-12 NOTE — Telephone Encounter (Signed)
 143/86 is the latest blood pressure.

## 2023-10-12 NOTE — Telephone Encounter (Signed)
 Hydralazine can sometimes cause leg swelling, I suspect the dizziness could be due to persistent elevated blood pressure. What is the blood pressure currently?  Thanks MJP

## 2023-10-12 NOTE — Telephone Encounter (Signed)
 Not very high. Monitor BP for now. If consistently >140/85, recommend adding isordil 20 mg tid or a least bid.  Thanks MJP

## 2023-10-12 NOTE — Telephone Encounter (Signed)
 Spoke to April (on [place]) and advised per verbal from Dr. Rosemary Holms to stop hydralazine and if blood pressure runs greater than 140/85, then start use of isordil 20 mg take one tablet by mouth three times a day.

## 2023-10-17 ENCOUNTER — Other Ambulatory Visit: Payer: Self-pay | Admitting: Cardiology

## 2023-10-17 ENCOUNTER — Ambulatory Visit (HOSPITAL_BASED_OUTPATIENT_CLINIC_OR_DEPARTMENT_OTHER): Admitting: Primary Care

## 2023-10-17 ENCOUNTER — Telehealth: Payer: Self-pay | Admitting: Cardiology

## 2023-10-17 MED ORDER — DILTIAZEM HCL 30 MG PO TABS: 30.0000 mg | ORAL_TABLET | Freq: Four times a day (QID) | ORAL | 1 refills | Status: AC | PRN

## 2023-10-17 NOTE — Telephone Encounter (Signed)
 Spoke with patient's sister April ([place] per [facility]). Shared Dr. Silverio Drought response:  Pain likely related to SVT. Multiple prior medication intolerances, not sure he will tolerate additional medication. Please discuss vagal manuevers and give prn diltiazem 30 mg.   Thanks MJP     Reviewed vagal maneuver's (bearing down, deep breaths, coughing) and using diltiazem PRN. April verbalized understanding.  Diltiazem 30 mg PRN sent to [facility] pharmacy.

## 2023-10-17 NOTE — Telephone Encounter (Signed)
 Pain likely related to SVT. Multiple prior medication intolerances, not sure he will tolerate additional medication. Please discuss vagal manuevers and give prn diltiazem 30 mg.  Thanks MJP

## 2023-10-17 NOTE — Telephone Encounter (Signed)
   Pt c/o of Chest Pain: STAT if active CP, including tightness, pressure, jaw pain, radiating pain to shoulder/upper arm/back, CP unrelieved by Nitro. Symptoms reported of SOB, nausea, vomiting, sweating.  1. Are you having CP right now? Chest pain, not at the time, Yes, left arm was aching    2. Are you experiencing any other symptoms (ex. SOB, nausea, vomiting, sweating)?    3. Is your CP continuous or coming and going? Steady   Have you taken Nitroglycerin? yes   5. How long have you been experiencing CP? Today at 1:46    6. If NO CP at time of call then end call with telling Pt to call back or call 911 if Chest pain returns prior to return call from triage team.

## 2023-10-17 NOTE — Telephone Encounter (Signed)
 Call transferred into triage for chest pain.  Pain occurred a couple nights ago, same as he had at hospital recently for BP elevation at [facility].  SVT was seen on monitor at that time. .  Today it started again and there was left arm pain as well.  BP 131/81, 85.  Fully resolved after second nitroglycerin.  He also felt heart racing today during the pain.  He is feeling okay now.  They are debating on keeping the pulm appointment this afternoon.  Encouraged to keep the pulm visit.  Also adv if pain returns and does not resolve in short period of time that he should be seen in the ER.  Wearing ZIO monitor through Monday and then will return.   Pt had calcium score of zero on cCTA in 2021.    Will continue to monitor.  Aware I will forward to Dr. Filiberto Hug and will call them back if any other recommendations.

## 2023-10-18 ENCOUNTER — Telehealth: Payer: Self-pay

## 2023-10-18 ENCOUNTER — Other Ambulatory Visit (HOSPITAL_COMMUNITY): Payer: Self-pay

## 2023-10-18 NOTE — Telephone Encounter (Signed)
 Pharmacy Patient Advocate Encounter   Received notification from CoverMyMeds that prior authorization for Prucalopride Succinate 2MG  tablets is required/requested.   Insurance verification completed.   The patient is insured through [facility] Medicare Part D .   Per test claim:  BRAND Motegrity** is preferred by the insurance.  If suggested medication is appropriate, Please send in a new RX and discontinue this one. If not, please advise as to why it's not appropriate so that we may request a Prior Authorization. Please note, some preferred medications may still require a PA.  If the suggested medications have not been trialed and there are no contraindications to their use, the PA will not be submitted, as it will not be approved.   **Co-pay is $0.00

## 2023-10-22 NOTE — H&P (Signed)
 Chief Complaint   Aneurysm  History of Present Illness  Casey Brennan is a 63 year old man I am seeing as a new patient although he was seen in our office by Dr.Eichman for cervical spondylosis.  Patient recently underwent an RF ablation in our office.  He was noted to be somewhat hypertensive after the procedure.  Patient states he checked his blood pressure at home later that day and it was still quite high, they report systolic in the 160s over 70s.  He was also having some headache and pulsatile tinnitus, as well as dizziness.  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  He was referred for outpatient Neurosurgical cerebrovascular evaluation.  Patient did have improvement in his headache and pulsatile symptoms after his blood pressure was brought back down.  Of note, the patient reports a history of medically controlled hypertension.  No history of diabetes.  No previous heart attack or stroke.  He does report a history of COPD and asthma.  No known liver disease.  He does have a diagnosis of stage 2 chronic kidney disease.  He has a history of both prostate cancer and multiple myeloma.  They report both cancers are in remission.  He is not on any blood thinners or anti-platelet agents.  He is a nonsmoker.  There is no known family history of intracranial aneurysms.  Past Medical History   Past Medical History:  Diagnosis Date   Asthma    very rare   Chronic anemia    normocytic   Chronic folliculitis    Chronic kidney disease    Stage 2   COPD (chronic obstructive pulmonary disease) (HCC)    Dyspnea    Environmental and seasonal allergies    Glaucoma    right   H/O pericarditis    2010--  myopercarditis--  resolved   Headache(784.0)    HX SEVERE FRONTAL HA'S   Hiatal hernia    History of gastric ulcer    nonbleeding gastric ulcer 58//24   History of kidney stones    History  of MRSA infection 2010   infected boil   HIV (human immunodeficiency virus infection) (HCC) 1988   Hypertension    Hypertensive cardiomyopathy (HCC)    EF 37%, normal coronaries 04/2020 [facility]; LVEF 60-65% 03/2022 echo   Lytic bone lesion of hip    WORK-UP DONE BY ONCOLOGIST DR HA --  NOT MALIGNANT   Nausea & vomiting 09/04/2023   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   Pneumonia    hx of   Post concussion syndrome    resolved   Prostate cancer (HCC) 04/25/2013   gleason 3+3=6, volume 30 gm   Viral meningitis    06/2021    Past Surgical History   Past Surgical History:  Procedure Laterality Date   ACDCF  09/29/2020   ANTERIOR CERVICAL DECOMPRESSION/DISCECTOMY FUSION 4 LEVELS N/A 09/29/2020   Procedure: ANTERIOR CERVICAL DECOMPRESSION FUSION CERVICAL 3-CERVICAL 4 , CERVICAL 4 - CERVICAL 5, CERVICAL 5 - CERVICAL 6 WITH INSTRUMENTATION AND ALLOGRAFT;  Surgeon: Virl Grimes, MD;  Location: [facility];  Service: Orthopedics;  Laterality: N/A;   CARDIAC CATHETERIZATION  03-23-2009  DR COOPER   NORMAL CORONARY ARTERIES   CARPAL TUNNEL RELEASE Right 08/09/2023   Procedure: RIGHT CARPAL TUNNEL RELEASE;  Surgeon: Brunilda Capra, MD;  Location: [facility];  Service: Orthopedics;  Laterality: Right;  75 MIN   CHOLECYSTECTOMY N/A  11/06/2014   Procedure: LAPAROSCOPIC CHOLECYSTECTOMY WITH INTRAOPERATIVE CHOLANGIOGRAM;  Surgeon: Jerryl Morin, MD;  Location: [facility] OR;  Service: General;  Laterality: N/A;   COLONOSCOPY  12/26/2011   Procedure: COLONOSCOPY;  Surgeon: Yvetta Herbert, MD;  Location: [facility];  Service: Endoscopy;  Laterality: N/A;   COLONOSCOPY     COLONOSCOPY WITH PROPOFOL  N/A 07/29/2014   Procedure: COLONOSCOPY WITH PROPOFOL ;  Surgeon: Yvetta Herbert, MD;  Location: [facility] END[facility]COPY;  Service: Endoscopy;  Laterality: N/A;   CYSTOSCOPY N/A 05/18/2018   Procedure: CYSTOSCOPY REMOVAL FOREIGN BODY;  Surgeon: Ottelin, Mark, MD;  Location: [facility]S;  Service: Urology;  Laterality:  N/A;   CYSTOSCOPY/RETROGRADE/URETEROSCOPY Bilateral 04/25/2013   Procedure: CYSTOSCOPY/ BILATERAL RETROGRADES; BLADDER BIOPSIES;  Surgeon: Osborn Blaze, MD;  Location: [facility];  Service: Urology;  Laterality: Bilateral;   DENTAL EXAMINATION UNDER ANESTHESIA     ESOPHAGOGASTRODUODENOSCOPY  12/26/2011   Procedure: ESOPHAGOGASTRODUODENOSCOPY (EGD);  Surgeon: Yvetta Herbert, MD;  Location: [facility];  Service: Endoscopy;  Laterality: N/A;   ESOPHAGOGASTRODUODENOSCOPY (EGD) WITH PROPOFOL  N/A 07/29/2014   Procedure: ESOPHAGOGASTRODUODENOSCOPY (EGD) WITH PROPOFOL ;  Surgeon: Yvetta Herbert, MD;  Location: [facility];  Service: Endoscopy;  Laterality: N/A;   EXCISION CHRONIC LEFT BREAST ABSCESS  09-21-2010   EXCISIONAL BX LEFT BREAST MASS/  I  &  D LEFT BREAST ABSCESS  03-24-2009   KNEE ARTHROSCOPY Right 1985   left axilla biopsy  04/2013   left hip biopsy  10/2012   LYMPHADENECTOMY Bilateral 08/18/2013   Procedure: LYMPHADENECTOMY "PELVIC LYMPH NODE DISSECTION";  Surgeon: Osborn Blaze, MD;  Location: [facility];  Service: Urology;  Laterality: Bilateral;   POSTERIOR CERVICAL FUSION/FORAMINOTOMY N/A 11/30/2022   Procedure: POSTERIOR DECOMPRESSION AND FUSION CERVICAL 4- CERVICAL 5, CERVICAL 5- CERVICAL 6, CERVICAL 6- CERVICAL 7, CERVICAL 7- THORACIC 1 WITH INSTRUMENTATION AND ALLOGRAFT;  Surgeon: Virl Grimes, MD;  Location: [facility];  Service: Orthopedics;  Laterality: N/A;   PROSTATE BIOPSY N/A 04/25/2013   Procedure: BIOPSY TRANSRECTAL ULTRASONIC PROSTATE (TUBP);  Surgeon: Osborn Blaze, MD;  Location: [facility];  Service: Urology;  Laterality: N/A;   ROBOT ASSISTED LAPAROSCOPIC RADICAL PROSTATECTOMY N/A 08/18/2013   Procedure: ROBOTIC ASSISTED LAPAROSCOPIC RADICAL PROSTATECTOMY;  Surgeon: Osborn Blaze, MD;  Location: [facility];  Service: Urology;  Laterality: N/A;   SHOULDER ARTHROSCOPY WITH ROTATOR CUFF REPAIR AND SUBACROMIAL DECOMPRESSION Left 03/13/2023    Procedure: SHOULDER ARTHROSCOPY WITH ROTATOR CUFF REPAIR AND SUBACROMIAL DECOMPRESSION, DISTAL CLAVICLECTOMY, BICEPS TENODESIS;  Surgeon: Saundra Curl, MD;  Location: [facility];  Service: Orthopedics;  Laterality: Left;   TOTAL KNEE ARTHROPLASTY Right 08/01/2016   08/17/16   TOTAL KNEE ARTHROPLASTY Right 08/01/2016   Procedure: TOTAL KNEE ARTHROPLASTY;  Surgeon: Saundra Curl, MD;  Location: [facility];  Service: Orthopedics;  Laterality: Right;   ULNAR NERVE TRANSPOSITION Right 08/09/2023   Procedure: RIGHT ULNAR NERVE DECOMPRESSION AT ELBOW, POSSIBLE TRANSPOSITION;  Surgeon: Brunilda Capra, MD;  Location: [facility];  Service: Orthopedics;  Laterality: Right;   UPPER GASTROINTESTINAL ENDOSCOPY     UPPER LEG SOFT TISSUE BIOPSY Left 2012   lthigh    Social History   Social History   Tobacco Use   Smoking status: Former    Current packs/day: 0.00    Average packs/day: 1.5 packs/day for 23.0 years (34.5 ttl pk-yrs)    Types: Cigars, Cigarettes    Start date: 05/23/1987    Quit date: 05/22/2010    Years since quitting: 13.4   Smokeless tobacco:  Never  Vaping Use   Vaping status: Never Used  Substance Use Topics   Alcohol  use: Not Currently    Alcohol /week: 0.0 standard drinks of alcohol     Comment: 1-2 drinks a week    Drug use: No    Medications   Prior to Admission medications   Medication Sig Start Date End Date Taking? Authorizing Provider  albuterol  (PROVENTIL ) (2.5 MG/3ML) 0.083% nebulizer solution Take 3 mLs (2.5 mg total) by nebulization every 6 (six) hours as needed for wheezing or shortness of breath. 01/25/22   Sandie Cross, MD  albuterol  (VENTOLIN  HFA) 108 (90 Base) MCG/ACT inhaler Inhale 2 puffs into the lungs every 6 (six) hours as needed for wheezing or shortness of breath. 05/09/23   Roslyn Coombe, MD  ALPRAZolam  (XANAX ) 0.5 MG tablet TAKE 1 TABLET(0.5 MG) BY MOUTH TWICE DAILY AS NEEDED FOR SLEEP 08/15/23   Dohmeier, Raoul Byes, MD   azelastine  (ASTELIN ) 0.1 % nasal spray Place 2 sprays into both nostrils in the morning. Use in each nostril as directed    [provider]  BELBUCA 300 MCG FILM STATES NOT USING BECAUSE THEY DONT WORK 05/21/23   [provider]  BREZTRI  AEROSPHERE 160-9-4.8 MCG/ACT AERO INHALE 2 PUFFS INTO THE LUNGS IN THE MORNING AND AT BEDTIME 09/24/23   Lind Repine, MD  budeson-glycopyrrolate -formoterol  (BREZTRI  AEROSPHERE) 160-9-4.8 MCG/ACT AERO Inhale 2 puffs into the lungs in the morning and at bedtime. 09/24/23   Alva, Rakesh V, MD  cabotegravir  & rilpivirine  ER (CABENUVA ) 600 & 900 MG/3ML injection Inject into the muscle once a month for two months then every other month 09/04/23   Sandie Cross, MD  cefadroxil  (DURICEF) 500 MG capsule Take 1 capsule (500 mg total) by mouth 2 (two) times daily. 09/07/23   Debbra Fairy, PA-C  cetirizine  (ZYRTEC ) 10 MG tablet TAKE 1 TABLET(10 MG) BY MOUTH DAILY 03/22/23   Roslyn Coombe, MD  Cholecalciferol  (VITAMIN D3) 250 MCG (10000 UT) capsule Take 1 tablet by mouth daily. [employer] 04/14/21   [provider]  diltiazem  (CARDIZEM ) 30 MG tablet Take 1 tablet (30 mg total) by mouth 4 (four) times daily as needed (for heart racing/palpitations). 10/17/23   Patwardhan, Kaye Parsons, MD  diphenhydrAMINE  (BENADRYL ) 25 MG tablet Take 1 tablet (25 mg total) by mouth every 6 (six) hours as needed. Patient taking differently: Take 25 mg by mouth every 6 (six) hours as needed for sleep, itching or allergies. 04/11/21   Venter, Margaux, PA-C  dorzolamide  (TRUSOPT ) 2 % ophthalmic solution Place 1 drop into both eyes 2 (two) times daily. 06/07/22   [provider]  EPINEPHrine  0.3 mg/0.3 mL IJ SOAJ injection INJECT 1 PEN IN THE MUSCLE ONE TIME AS DIRECTED AS NEEDED FOR ANAPHYLAXIS 10/03/23   Roslyn Coombe, MD  famotidine  (PEPCID ) 40 MG tablet Take 1 tablet (40 mg total) by mouth at bedtime. 04/19/23   Graciella Lavender, PA  gabapentin  (NEURONTIN ) 300  MG capsule TAKE 1 CAPSULE(300 MG) BY MOUTH FOUR TIMES DAILY AS NEEDED FOR PAIN 01/01/23   Dohmeier, Raoul Byes, MD  Glucos-Chond-Hyal Ac-Ca Fructo (MOVE FREE JOINT HEALTH ADVANCE PO) Take 1 tablet by mouth daily.    [provider]  HYDROcodone -acetaminophen  (NORCO/VICODIN) 5-325 MG tablet 1-2 tabs PO q6 hours prn pain 08/09/23   Kuzma, Kevin, MD  hydrOXYzine  (ATARAX ) 50 MG tablet TAKE 1 TABLET(50 MG) BY MOUTH TWICE DAILY 07/12/23   Patwardhan, Kaye Parsons, MD  isosorbide  dinitrate (ISORDIL ) 20 MG tablet Take  1 tablet (20 mg total) by mouth 3 (three) times daily. 10/12/23   Patwardhan, Kaye Parsons, MD  meclizine  (ANTIVERT ) 25 MG tablet Take 1 tablet (25 mg total) by mouth daily as needed. 08/24/23   Roslyn Coombe, MD  methocarbamol  (ROBAXIN ) 500 MG tablet Take 1-2 tablets (500-1,000 mg total) by mouth every 6 (six) hours as needed for muscle spasms. 12/01/22   Virl Grimes, MD  metoprolol  succinate (TOPROL -XL) 100 MG 24 hr tablet Take 1 tablet (100 mg total) by mouth daily. Take with or immediately following a meal. 10/01/23   Patwardhan, Manish J, MD  montelukast  (SINGULAIR ) 10 MG tablet Take 10 mg by mouth daily. 11/15/20   [provider]  Multiple Vitamin (MULTIVITAMIN WITH MINERALS) TABS tablet Take 1 tablet by mouth daily after breakfast.     [provider]  mupirocin  ointment (BACTROBAN ) 2 % APPLY EXTERNALLY TO THE AFFECTED AREA TWICE DAILY FOR 3 WEEKS, PLACE COTTON BETWEEN TOES TO ALLOW BETTER AERATION AS NEEDED Patient taking differently: Apply 1 application  topically daily as needed (affected toes). 04/20/21   Sandie Cross, MD  nitroGLYCERIN  (NITROSTAT ) 0.4 MG SL tablet Place 1 tablet (0.4 mg total) under the tongue every 5 (five) minutes as needed for chest pain. 02/08/22 09/07/23  Patwardhan, Kaye Parsons, MD  ondansetron  (ZOFRAN -ODT) 4 MG disintegrating tablet Take 1 tablet (4 mg total) by mouth every 8 (eight) hours as needed for nausea or vomiting. Patient taking differently:  Take 4 mg by mouth every 4 (four) hours as needed for nausea or vomiting. 07/17/22   Colene Dauphin, MD  OVER THE COUNTER MEDICATION Take 2 capsules by mouth daily. Zhou: thyroid  supplement    [provider]  pantoprazole  (PROTONIX ) 40 MG tablet Take 1 tablet (40 mg total) by mouth 2 (two) times daily before a meal. 11/08/22   Kenney Peacemaker, MD  Prucalopride Succinate  (MOTEGRITY ) 2 MG TABS Take 1 tablet (2 mg total) by mouth daily. 09/19/23   Pyrtle, Amber Bail, MD  QNASL  80 MCG/ACT AERS USE 2 SPRAYS IN EACH NOSTRIL DAILY Patient taking differently: Place 2 sprays into both nostrils in the morning and at bedtime. 11/16/22   Roslyn Coombe, MD  QULIPTA  30 MG TABS TAKE 1 TABLET(30 MG) BY MOUTH DAILY 05/21/23   Dohmeier, Raoul Byes, MD  rosuvastatin  (CRESTOR ) 20 MG tablet TAKE 1 TABLET(20 MG) BY MOUTH DAILY 08/15/23   Roslyn Coombe, MD  tizanidine  (ZANAFLEX ) 6 MG capsule Take 1 capsule (6 mg total) by mouth 3 (three) times daily. 08/24/23   Roslyn Coombe, MD  torsemide  (DEMADEX ) 20 MG tablet Take 10 mg by mouth See admin instructions. Tuesday Thursday Saturday    [provider]  Ubrogepant  (UBRELVY ) 100 MG TABS Take 1 tablet (100 mg total) by mouth as needed (take 1 tablet at onset of migraine. may take an additional tablet 2 hrs later if needed (Do not exceed more than 2 tablets in 24 hour period)). 03/22/23   Dohmeier, Raoul Byes, MD  zolpidem  (AMBIEN ) 5 MG tablet Take 1-2 tablets (5-10 mg total) by mouth at bedtime as needed for sleep. Patient not taking: Reported on 04/23/2019 12/21/16 05/19/19  Jenean Minus, DO    Allergies   Allergies  Allergen Reactions   Amoxapine Shortness Of Breath   Chocolate Shortness Of Breath and Other (See Comments)    Asthma attack, welts   Ciprofloxacin  Anaphylaxis, Dermatitis, Itching, Nausea And Vomiting, Shortness Of Breath, Swelling and Hives   Descovy  [Emtricitabine -Tenofovir  Af]  Shortness Of Breath    Nasuea, vomiiting, diarrhea, sob, whelps, throat  closes   Doxycycline  Hyclate Shortness Of Breath and Swelling   Genvoya  [Elviteg-Cobic-Emtricit-Tenofaf] Shortness Of Breath    Nausea, vomitting, diarreha, sob, rash, throat closing   Morphine  And Codeine Shortness Of Breath, Itching and Other (See Comments)     chest pain   Other Anaphylaxis   Oxycodone -Acetaminophen  Shortness Of Breath   Penicillins Shortness Of Breath and Rash    Has patient had a PCN reaction causing immediate rash, facial/tongue/throat swelling, SOB or lightheadedness with hypotension: Yes Has patient had a PCN reaction causing severe rash involving mucus membranes or skin necrosis: No Has patient had a PCN reaction that required hospitalization No Has patient had a PCN reaction occurring within the last 10 years: Yes If all of the above answers are "NO", then may proceed with Cephalosporin use.   Sulfa Antibiotics Anaphylaxis and Other (See Comments)   Tomato Shortness Of Breath   Vancomycin  Shortness Of Breath and Other (See Comments)   Darunavir  Itching   Amlodipine  Other (See Comments)    swelling   Carvedilol      LEG SWELLING, DIZZNESS   Flagyl  [Metronidazole ] Itching   Hydralazine      Dizziness and leg swelling    Meloxicam     Milk-Related Compounds    Morphine  Other (See Comments)   Oxycodone  Hcl Other (See Comments)   Penicillin G Other (See Comments)   Toradol  [Ketorolac  Tromethamine ] Nausea And Vomiting    itching   Acetaminophen  Rash and Other (See Comments)    Pt tolerates percocet and also plain APAP without rash   Reglan  [Metoclopramide ]     Tremors   Tramadol  Itching, Nausea And Vomiting and Other (See Comments)    Review of Systems  ROS  Neurologic Exam  Awake, alert, oriented Memory and concentration grossly intact Speech fluent, appropriate CN grossly intact Motor exam: Upper Extremities Deltoid Bicep Tricep Grip  Right 5/5 5/5 5/5 5/5  Left 5/5 5/5 5/5 5/5   Lower Extremities IP Quad PF DF EHL  Right 5/5 5/5 5/5 5/5 5/5   Left 5/5 5/5 5/5 5/5 5/5   Sensation grossly intact to LT  Imaging  CT angiogram of the head dated 09/07/2023 was personally reviewed.  This demonstrates a fusiform enlargement of the supraclinoid right internal carotid artery measuring up to 7 millimeters in diameter.  There may be a medially projecting saccular component although it is difficult to tell based on MPR alone.  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  Given the size, I do think further evaluation is reasonable.  Plan  - We will proceed with diagnostic cerebral angiogram  I have reviewed the indications for the procedure as well as the details of the procedure and the expected postoperative course and recovery at length with the patient in the office. We have also reviewed in detail the risks, benefits, and alternatives to the procedure. All questions were answered and [name] provided informed consent to proceed.  Augusto Blonder, MD [facility]

## 2023-10-23 ENCOUNTER — Ambulatory Visit (HOSPITAL_COMMUNITY)
Admission: RE | Admit: 2023-10-23 | Discharge: 2023-10-23 | Disposition: A | Discharge status: Home / Self Care | Source: Ambulatory Visit | Attending: Neurosurgery | Admitting: Neurosurgery

## 2023-10-23 ENCOUNTER — Other Ambulatory Visit: Payer: Self-pay | Admitting: Neurosurgery

## 2023-10-23 ENCOUNTER — Other Ambulatory Visit: Payer: Self-pay

## 2023-10-23 DIAGNOSIS — I129 Hypertensive chronic kidney disease with stage 1 through stage 4 chronic kidney disease, or unspecified chronic kidney disease: Secondary | ICD-10-CM | POA: Diagnosis not present

## 2023-10-23 DIAGNOSIS — I671 Cerebral aneurysm, nonruptured: Secondary | ICD-10-CM

## 2023-10-23 DIAGNOSIS — F17290 Nicotine dependence, other tobacco product, uncomplicated: Secondary | ICD-10-CM | POA: Diagnosis not present

## 2023-10-23 DIAGNOSIS — N182 Chronic kidney disease, stage 2 (mild): Secondary | ICD-10-CM | POA: Insufficient documentation

## 2023-10-23 DIAGNOSIS — Z8546 Personal history of malignant neoplasm of prostate: Secondary | ICD-10-CM | POA: Diagnosis not present

## 2023-10-23 HISTORY — PX: IR ANGIO VERTEBRAL SEL VERTEBRAL BILAT MOD SED: IMG5369

## 2023-10-23 HISTORY — PX: IR ANGIO INTRA EXTRACRAN SEL INTERNAL CAROTID BILAT MOD SED: IMG5363

## 2023-10-23 LAB — CBC WITH DIFFERENTIAL/PLATELET
Abs Immature Granulocytes: 0.01 10*3/uL (ref 0.00–0.07)
Basophils Absolute: 0 10*3/uL (ref 0.0–0.1)
Basophils Relative: 1 %
Eosinophils Absolute: 0.3 10*3/uL (ref 0.0–0.5)
Eosinophils Relative: 9 %
HCT: 29.6 % — ABNORMAL LOW (ref 39.0–52.0)
Hemoglobin: 9.4 g/dL — ABNORMAL LOW (ref 13.0–17.0)
Immature Granulocytes: 0 %
Lymphocytes Relative: 51 %
Lymphs Abs: 2 10*3/uL (ref 0.7–4.0)
MCH: 30.4 pg (ref 26.0–34.0)
MCHC: 31.8 g/dL (ref 30.0–36.0)
MCV: 95.8 fL (ref 80.0–100.0)
Monocytes Absolute: 0.4 10*3/uL (ref 0.1–1.0)
Monocytes Relative: 11 %
Neutro Abs: 1.1 10*3/uL — ABNORMAL LOW (ref 1.7–7.7)
Neutrophils Relative %: 28 %
Platelets: 177 10*3/uL (ref 150–400)
RBC: 3.09 MIL/uL — ABNORMAL LOW (ref 4.22–5.81)
RDW: 16.6 % — ABNORMAL HIGH (ref 11.5–15.5)
WBC: 3.9 10*3/uL — ABNORMAL LOW (ref 4.0–10.5)
nRBC: 0 % (ref 0.0–0.2)

## 2023-10-23 LAB — BASIC METABOLIC PANEL WITH GFR
Anion gap: 5 (ref 5–15)
BUN: 10 mg/dL (ref 6–20)
CO2: 26 mmol/L (ref 22–32)
Calcium: 8.7 mg/dL — ABNORMAL LOW (ref 8.9–10.3)
Chloride: 109 mmol/L (ref 98–111)
Creatinine, Ser: 1.29 mg/dL — ABNORMAL HIGH (ref 0.61–1.24)
GFR, Estimated: >60 mL/min (ref >60)
Glucose, Bld: 96 mg/dL (ref 70–99)
Potassium: 3.3 mmol/L — ABNORMAL LOW (ref 3.5–5.1)
Sodium: 140 mmol/L (ref 135–145)

## 2023-10-23 LAB — PROTIME-INR
INR: 1 (ref 0.8–1.2)
Prothrombin Time: 13.5 s (ref 11.4–15.2)

## 2023-10-23 MED ORDER — HYDROCODONE-ACETAMINOPHEN 5-325 MG PO TABS: 1.0000 | ORAL_TABLET | ORAL | Status: DC | PRN

## 2023-10-23 MED ORDER — MIDAZOLAM HCL 2 MG/2ML IJ SOLN
INTRAMUSCULAR | Status: AC
  Filled 2023-10-23: qty 2

## 2023-10-23 MED ORDER — LIDOCAINE HCL 1 % IJ SOLN
INTRAMUSCULAR | Status: AC
  Filled 2023-10-23: qty 20

## 2023-10-23 MED ORDER — LIDOCAINE HCL 1 % IJ SOLN
20.0000 mL | Freq: Once | INTRAMUSCULAR | Status: AC
Start: 2023-10-23 — End: 2023-10-23
  Administered 2023-10-23: 10 mL via INTRADERMAL

## 2023-10-23 MED ORDER — FENTANYL CITRATE (PF) 100 MCG/2ML IJ SOLN
INTRAMUSCULAR | Status: AC
  Filled 2023-10-23: qty 2

## 2023-10-23 MED ORDER — HEPARIN SODIUM (PORCINE) 1000 UNIT/ML IJ SOLN
INTRAMUSCULAR | Status: AC | PRN
  Administered 2023-10-23: 2000 [IU] via INTRA_ARTERIAL

## 2023-10-23 MED ORDER — IOHEXOL 300 MG/ML  SOLN
100.0000 mL | Freq: Once | INTRAMUSCULAR | Status: AC | PRN
  Administered 2023-10-23: 49 mL via INTRA_ARTERIAL

## 2023-10-23 MED ORDER — VANCOMYCIN HCL IN DEXTROSE 1-5 GM/200ML-% IV SOLN: 1000.0000 mg | INTRAVENOUS | Status: DC

## 2023-10-23 MED ORDER — FENTANYL CITRATE (PF) 100 MCG/2ML IJ SOLN
INTRAMUSCULAR | Status: AC | PRN
  Administered 2023-10-23: 25 ug via INTRAVENOUS

## 2023-10-23 MED ORDER — MIDAZOLAM HCL 2 MG/2ML IJ SOLN
INTRAMUSCULAR | Status: AC | PRN
  Administered 2023-10-23: .5 mg via INTRAVENOUS

## 2023-10-23 MED ORDER — HEPARIN SODIUM (PORCINE) 1000 UNIT/ML IJ SOLN
INTRAMUSCULAR | Status: AC
  Filled 2023-10-23: qty 10

## 2023-10-23 NOTE — Discharge Instructions (Signed)
 Femoral Site Care This sheet gives you information about how to care for yourself after your procedure. Your health care provider may also give you more specific instructions. If you have problems or questions, contact your health care provider. What can I expect after the procedure?  After the procedure, it is common to have: Bruising that usually fades within 1-2 weeks. Tenderness at the site. Follow these instructions at home: Wound care Follow instructions from your health care provider about how to take care of your insertion site. Make sure you: Wash your hands with soap and water before you change your bandage (dressing). If soap and water are not available, use hand sanitizer. Remove your dressing as told by your health care provider. 24 hours Do not take baths, swim, or use a hot tub until your health care provider approves. You may shower 24-48 hours after the procedure or as told by your health care provider. Gently wash the site with plain soap and water. Pat the area dry with a clean towel. Do not rub the site. This may cause bleeding. Do not apply powder or lotion to the site. Keep the site clean and dry. Check your femoral site every day for signs of infection. Check for: Redness, swelling, or pain. Fluid or blood. Warmth. Pus or a bad smell. Activity For the first 2-3 days after your procedure, or as long as directed: Avoid climbing stairs as much as possible. Do not squat. Do not lift anything that is heavier than 10 lb (4.5 kg), or the limit that you are told, until your health care provider says that it is safe. For 5 days Rest as directed. Avoid sitting for a long time without moving. Get up to take short walks every 1-2 hours. Do not drive for 24 hours if you were given a medicine to help you relax (sedative). General instructions Take over-the-counter and prescription medicines only as told by your health care provider. Keep all follow-up visits as told by your  health care provider. This is important. Contact a health care provider if you have: A fever or chills. You have redness, swelling, or pain around your insertion site. Get help right away if: The catheter insertion area swells very fast. You Lwin out. You suddenly start to sweat or your skin gets clammy. The catheter insertion area is bleeding, and the bleeding does not stop when you hold steady pressure on the area. The area near or just beyond the catheter insertion site becomes pale, cool, tingly, or numb. These symptoms may represent a serious problem that is an emergency. Do not wait to see if the symptoms will go away. Get medical help right away. Call your local emergency services (911 in the [place] Do not drive yourself to the hospital. Summary After the procedure, it is common to have bruising that usually fades within 1-2 weeks. Check your femoral site every day for signs of infection. Do not lift anything that is heavier than 10 lb (4.5 kg), or the limit that you are told, until your health care provider says that it is safe. This information is not intended to replace advice given to you by your health care provider. Make sure you discuss any questions you have with your health care provider. Document Revised: 07/02/2017 Document Reviewed: 07/02/2017 Elsevier Patient Education  2020 [employer].

## 2023-10-23 NOTE — Op Note (Signed)
 ENDOVASCULAR NEUROSURGERY OPERATIVE NOTE   PROCEDURE: Diagnostic Cerebral Angiogram   SURGEON:   Dr. Augusto Blonder, MD  HISTORY:   The patient is a 63 y.o. yo male with previous incidental discovery of fusiform right internal carotid artery aneurysm.  Patient therefore presents today for further workup with diagnostic cerebral angiogram.  APPROACH:   The technical aspects of the procedure as well as its potential risks and benefits were reviewed with the patient. These risks included but were not limited bleeding, infection, allergic reaction, damage to organs/vital structures, stroke, non-diagnostic procedure, and the catastrophic outcomes of heart attack, coma, and death. With an understanding of these risks, informed consent was obtained and witnessed.    The patient was placed in the supine position on the angiography table and the skin of right groin prepped in the usual sterile fashion. The procedure was performed under local anesthesia (1%-solution of bicarbonate-bufferred Lidoacaine) and conscious sedation with Versed  and fentanyl  monitored by the in-suite nurse and myself, including non-invasive blood pressure and continuous pulse oxymetry.    Access to the right common femoral artery was obtained using standard Seldinger technique.  A short 5 French sheath was placed.  Position was documented with DSA.  HEPARIN :  2000 Units total.    CONTRAST AGENT:  See IR records   FLUOROSCOPY TIME:  See IR records    CATHETER(S) AND WIRE(S):    5-French JB-1 glidecatheter   0.035" glidewire    VESSELS CATHETERIZED:   Right internal carotid   Left internal carotid   Right vertebral   Left vertebral   Right common femoral  VESSELS STUDIED:   Right internal carotid, head Left internal carotid, head Left vertebral Right vertebral Right femoral  PROCEDURAL NARRATIVE:   A 5-Fr JB-1 terumo glide catheter was advanced over a 0.035 glidewire into the aortic arch. The above  vessels were then sequentially catheterized and cervical/cerebral angiograms taken. After review of images, the catheter was removed without incident.    INTERPRETATION:   Right internal carotid, head:   Injection reveals the presence of a widely patent ICA, M1, and A1 segments and their branches.  There is fusiform dilatation of the supraclinoid the right internal carotid artery.  Overall dimension of the fusiform aneurysm is approximately 5.4 x 6.6 mm.  There is no preaneurysmal stenosis.  I do not identify any saccular component to the aneurysm.  The parenchymal and venous phases are unremarkable. The venous sinuses are widely patent.    Left common carotid, head:   Injection reveals the presence of a widely patent ICA, A1, and M1 segments and their branches. No aneurysms, arteriovenous malformations, or high flow fistulas are visualized.  The parenchymal and venous phases are unremarkable. The venous sinuses are widely patent.    Left vertebral:   Injection reveals the presence of a widely patent vertebral artery. This leads to a widely patent basilar artery that terminates in bilateral P1. The basilar apex is normal. No aneurysms, arteriovenous malformations, or high flow fistulas are visualized. The parenchymal and venous phases are normal. The venous sinuses are patent.    Right vertebral:    Normal vessel. No PICA aneurysm. See basilar description above.    Right femoral:    Normal vessel. No significant atherosclerotic disease. Arterial sheath in adequate position.   DISPOSITION:  Upon completion of the study, the femoral sheath was removed and hemostasis obtained using a 5-Fr ExoSeal closure device. Good proximal and distal lower extremity pulses were documented upon achievement of hemostasis. The  procedure was well tolerated and no early complications were observed.  The patient was transferred to the recovery area to be positioned flat in bed for 3 hours.    IMPRESSION:  1.   Fusiform dilatation of the supraclinoid right internal carotid artery measuring approximately 6.6 mm in maximal dimension.  There is no stenosis or saccular component to the aneurysm.    Augusto Blonder, MD [facility]

## 2023-10-23 NOTE — Sedation Documentation (Signed)
 Patient transported to recovery area via stretcher. Bedside report given to RN. Femoral site assessed - Level 0, no hematoma, dressing is clean, dry, and intact. Pulses also assessed bilaterally.

## 2023-10-25 ENCOUNTER — Other Ambulatory Visit: Payer: Self-pay | Admitting: Infectious Diseases

## 2023-10-25 DIAGNOSIS — J449 Chronic obstructive pulmonary disease, unspecified: Secondary | ICD-10-CM

## 2023-10-26 ENCOUNTER — Encounter (HOSPITAL_COMMUNITY): Payer: Self-pay

## 2023-10-26 ENCOUNTER — Other Ambulatory Visit: Payer: Self-pay

## 2023-10-26 ENCOUNTER — Other Ambulatory Visit: Payer: Self-pay | Admitting: Infectious Diseases

## 2023-10-26 DIAGNOSIS — M25641 Stiffness of right hand, not elsewhere classified: Secondary | ICD-10-CM | POA: Diagnosis not present

## 2023-10-26 DIAGNOSIS — R29898 Other symptoms and signs involving the musculoskeletal system: Secondary | ICD-10-CM | POA: Diagnosis not present

## 2023-10-26 DIAGNOSIS — M79641 Pain in right hand: Secondary | ICD-10-CM | POA: Diagnosis not present

## 2023-10-26 NOTE — Progress Notes (Addendum)
 Specialty Pharmacy Refill Coordination Note  Casey Brennan is a 64 y.o. male, patients sister was contacted today regarding refills of specialty medication(s) Cabotegravir  & Rilpivirine  (Cabenuva )   Patient requested Courier to Provider Office   Delivery date: 11/06/23   Verified address: [facility] [place], [place] [address]    Medication will be filled on 11/05/23.   This fill date is pending response to refill request from provider. Patient sister is aware and if they have not received fill by intended date they must follow up with pharmacy.

## 2023-10-29 ENCOUNTER — Other Ambulatory Visit: Payer: Self-pay

## 2023-10-29 ENCOUNTER — Encounter: Payer: Self-pay | Admitting: Cardiology

## 2023-10-29 ENCOUNTER — Ambulatory Visit (HOSPITAL_COMMUNITY): Attending: Cardiology

## 2023-10-29 DIAGNOSIS — I10 Essential (primary) hypertension: Secondary | ICD-10-CM | POA: Diagnosis not present

## 2023-10-29 DIAGNOSIS — I4729 Other ventricular tachycardia: Secondary | ICD-10-CM | POA: Diagnosis not present

## 2023-10-29 LAB — ECHOCARDIOGRAM COMPLETE
Area-P 1/2: 2.99 cm2
S' Lateral: 3 cm

## 2023-10-29 MED ORDER — CABENUVA 600 & 900 MG/3ML IM SUER
INTRAMUSCULAR | 0 refills | Status: DC
  Filled 2023-10-29: qty 6, 60d supply, fill #0

## 2023-10-30 ENCOUNTER — Other Ambulatory Visit: Payer: Self-pay

## 2023-10-30 NOTE — Progress Notes (Signed)
 Clinical Intervention Note  Clinical Intervention Notes: Patient sister reports he has initiated isosorbide  and diltiazem . No DDIs with Cabenuva  identified.   Clinical Intervention Outcomes: Prevention of an adverse drug event   Rena Carnes Specialty Pharmacist

## 2023-10-31 DIAGNOSIS — G5601 Carpal tunnel syndrome, right upper limb: Secondary | ICD-10-CM | POA: Diagnosis not present

## 2023-10-31 DIAGNOSIS — G5621 Lesion of ulnar nerve, right upper limb: Secondary | ICD-10-CM | POA: Diagnosis not present

## 2023-10-31 DIAGNOSIS — I671 Cerebral aneurysm, nonruptured: Secondary | ICD-10-CM | POA: Diagnosis not present

## 2023-11-01 ENCOUNTER — Telehealth: Payer: Self-pay | Admitting: Cardiology

## 2023-11-01 DIAGNOSIS — I4729 Other ventricular tachycardia: Secondary | ICD-10-CM | POA: Diagnosis not present

## 2023-11-01 DIAGNOSIS — I10 Essential (primary) hypertension: Secondary | ICD-10-CM | POA: Diagnosis not present

## 2023-11-01 NOTE — Telephone Encounter (Signed)
 [facility] Contacted by [employer] regarding abnormal ziopatch findings. One episode of CHB recorded on October 13, 2023 at 3:08pm, lasting 11 seconds total duration. No other high-grade AVB recorded. Ordering provider notified.  Johnn Najjar, MD , [facility] 11/01/23 9:08 PM

## 2023-11-02 ENCOUNTER — Encounter (HOSPITAL_COMMUNITY): Payer: Self-pay | Admitting: Emergency Medicine

## 2023-11-02 ENCOUNTER — Other Ambulatory Visit: Payer: Self-pay

## 2023-11-02 DIAGNOSIS — I10 Essential (primary) hypertension: Secondary | ICD-10-CM | POA: Diagnosis not present

## 2023-11-02 DIAGNOSIS — I4729 Other ventricular tachycardia: Secondary | ICD-10-CM | POA: Diagnosis not present

## 2023-11-02 DIAGNOSIS — I442 Atrioventricular block, complete: Secondary | ICD-10-CM

## 2023-11-02 NOTE — Telephone Encounter (Signed)
 See separate message regarding monitor results.  Thanks MJP

## 2023-11-02 NOTE — Progress Notes (Signed)
 There are a few significant findings on the monitor.  There was 1 episode of complete heart block that lasted for 11 seconds in the afternoon.  Sometimes these could occur during sleep due to sleep apnea.  I am not sure if you were sleeping at this time.  I do not see any associated symptoms reported.  Separately, you had an episode of ventricular tachycardia that was noted during ER evaluation.  Coupled with your chronic symptoms of chest pain, I would like to do some further test to find out if this could possibly due to a condition like cardiac sarcoidosis that could explain rhythm issues as well as unexplained chest pain symptoms.  I would recommend cardiac MRI and PET CT scan for sarcoid evaluation.  I am aware that you have a lot of instrumentation in your spine.  The images should be able to screen and let us  know if MRI is possible or not.  In addition, given the recent finding of complete heart block and NSVT, I would refer you to see electrophysiology doctors.  Thanks MJP

## 2023-11-05 ENCOUNTER — Other Ambulatory Visit (HOSPITAL_COMMUNITY): Payer: Self-pay

## 2023-11-05 ENCOUNTER — Other Ambulatory Visit: Payer: Self-pay

## 2023-11-07 DIAGNOSIS — G894 Chronic pain syndrome: Secondary | ICD-10-CM | POA: Diagnosis not present

## 2023-11-07 DIAGNOSIS — M47812 Spondylosis without myelopathy or radiculopathy, cervical region: Secondary | ICD-10-CM | POA: Diagnosis not present

## 2023-11-08 ENCOUNTER — Other Ambulatory Visit (HOSPITAL_COMMUNITY): Payer: Self-pay

## 2023-11-09 ENCOUNTER — Encounter (HOSPITAL_COMMUNITY): Payer: Self-pay

## 2023-11-12 ENCOUNTER — Ambulatory Visit (HOSPITAL_COMMUNITY)
Admission: RE | Admit: 2023-11-12 | Discharge: 2023-11-12 | Disposition: A | Discharge status: Home / Self Care | Source: Ambulatory Visit | Attending: Cardiology | Admitting: Cardiology

## 2023-11-12 ENCOUNTER — Ambulatory Visit (INDEPENDENT_AMBULATORY_CARE_PROVIDER_SITE_OTHER)

## 2023-11-12 ENCOUNTER — Other Ambulatory Visit: Payer: Self-pay | Admitting: Cardiology

## 2023-11-12 ENCOUNTER — Ambulatory Visit

## 2023-11-12 DIAGNOSIS — I4729 Other ventricular tachycardia: Secondary | ICD-10-CM

## 2023-11-12 DIAGNOSIS — B20 Human immunodeficiency virus [HIV] disease: Secondary | ICD-10-CM

## 2023-11-12 DIAGNOSIS — I442 Atrioventricular block, complete: Secondary | ICD-10-CM

## 2023-11-12 DIAGNOSIS — I340 Nonrheumatic mitral (valve) insufficiency: Secondary | ICD-10-CM | POA: Diagnosis not present

## 2023-11-12 MED ORDER — GADOBUTROL 1 MMOL/ML IV SOLN
10.0000 mL | Freq: Once | INTRAVENOUS | Status: AC | PRN
  Administered 2023-11-12: 10 mL via INTRAVENOUS

## 2023-11-12 NOTE — Progress Notes (Signed)
    Patient presents for Cabenuva  injections   States feeling well   Target date is 11/14/23 of each month   Last injection received 09/14/23   Patient is within 7 day window before or after target date for injections   Unable to use VG sites 2/2 body habitus   Rilpivirine  900 mg Given IM RUOQ. Patient tolerated well.   Cabotegravir  600 given IM LUOQ. Patient tolerated well.   Patient waited 10 minutes in clinic following injections without incident.  Next injections are due 01/14/24  Anyela Napierkowski,CMA

## 2023-11-13 NOTE — Telephone Encounter (Signed)
 This is only a technically detailed with no clinical significance.  The infectious process based on pressure gradient of tricuspid regurgitation.  However, if there is inadequate signal noted, it is not possible to calculate PA pressure.  On a separate note, also reviewed his cardiac MRI.  Cardiac MRI does not suggest sarcoidosis.  We should continue EP referral for NSVT and intermittent complete heart block.  Thanks MJP

## 2023-11-14 ENCOUNTER — Ambulatory Visit

## 2023-11-20 ENCOUNTER — Encounter (HOSPITAL_COMMUNITY): Payer: Self-pay

## 2023-11-20 ENCOUNTER — Telehealth (HOSPITAL_COMMUNITY): Payer: Self-pay | Admitting: Emergency Medicine

## 2023-11-20 NOTE — Telephone Encounter (Signed)
 Reaching out to patient to offer assistance regarding upcoming cardiac imaging study; pt verbalizes understanding of appt date/time, parking situation and where to check in, pre-test NPO status and medications ordered, and verified current allergies; name and call back number provided for further questions should they arise Rockwell Alexandria RN Navigator Cardiac Imaging [facility] [phone] office [phone] cell

## 2023-11-21 ENCOUNTER — Other Ambulatory Visit: Payer: Self-pay | Admitting: Neurology

## 2023-11-22 ENCOUNTER — Encounter: Payer: Self-pay | Admitting: Neurology

## 2023-11-22 ENCOUNTER — Encounter (HOSPITAL_BASED_OUTPATIENT_CLINIC_OR_DEPARTMENT_OTHER)
Admission: RE | Admit: 2023-11-22 | Discharge: 2023-11-22 | Disposition: A | Discharge status: Home / Self Care | Source: Ambulatory Visit | Attending: Cardiology

## 2023-11-22 ENCOUNTER — Ambulatory Visit (INDEPENDENT_AMBULATORY_CARE_PROVIDER_SITE_OTHER): Payer: 59 | Admitting: Neurology

## 2023-11-22 VITALS — BP 142/98 | HR 74 | Ht 69.0 in | Wt 231.8 lb

## 2023-11-22 DIAGNOSIS — I340 Nonrheumatic mitral (valve) insufficiency: Secondary | ICD-10-CM | POA: Diagnosis not present

## 2023-11-22 DIAGNOSIS — M5481 Occipital neuralgia: Secondary | ICD-10-CM | POA: Insufficient documentation

## 2023-11-22 DIAGNOSIS — I4729 Other ventricular tachycardia: Secondary | ICD-10-CM | POA: Diagnosis not present

## 2023-11-22 DIAGNOSIS — G4700 Insomnia, unspecified: Secondary | ICD-10-CM | POA: Diagnosis not present

## 2023-11-22 DIAGNOSIS — I442 Atrioventricular block, complete: Secondary | ICD-10-CM | POA: Diagnosis not present

## 2023-11-22 MED ORDER — GABAPENTIN 300 MG PO CAPS: ORAL_CAPSULE | ORAL | 4 refills | Status: AC

## 2023-11-22 MED ORDER — QULIPTA 30 MG PO TABS: ORAL_TABLET | ORAL | 5 refills | Status: DC

## 2023-11-22 MED ORDER — FLUDEOXYGLUCOSE F - 18 (FDG) INJECTION
8.9100 | Freq: Once | INTRAVENOUS | Status: AC
  Administered 2023-11-22: 8.91 via INTRAVENOUS

## 2023-11-22 MED ORDER — ALPRAZOLAM 0.5 MG PO TABS: ORAL_TABLET | ORAL | 2 refills | Status: DC

## 2023-11-22 MED ORDER — RUBIDIUM RB82 GENERATOR (RUBYFILL)
25.3000 | PACK | Freq: Once | INTRAVENOUS | Status: AC
  Administered 2023-11-22: 25.3 via INTRAVENOUS

## 2023-11-22 NOTE — Progress Notes (Signed)
 Provider:  Neomia Banner, MD  Primary Care Physician:  Roslyn Coombe, MD [address] [place] [address]     Referring Provider: Roslyn Coombe, Md [address] [place],  [place] [address]          Chief Complaint according to patient   Patient presents with:                HISTORY OF PRESENT ILLNESS:  Casey Brennan is a 63 y.o. male patient who is HIV positive ( but with excellent CD 4 counts)  and has had a viral meningitis in the past, 12-22- 2022.   He is seen here for headaches of various kind,  usually the office is requested by his sister April.  And here for revisit 11/22/2023 for headaches .  Chief concern according to patient :"  still having headaches"  An incidental finding of an aneurysm  during work up for NSVT.  March 6th.  Angiography with dr Nat Badger  for aneurysm documention:  10-23-2023   Cardiac AV function,  no CAD , ruled out myocardial sarcoidosis.  He had sarcoid pulmonary studies. PET scan - no results posted yet.    He had undergone an ablation w/[facility]. Ablation on C1 & C2 on 09/06/23 and had previously a Posterior cervical decompression C4 to T1 in May 2024.   He has a harder time swallowing, a bolus sensation. Followed by GI.  He developed gastroparesis, dx by Dr Bridgett Camps in Nov 2024.   "He cannot take Reglan , as he developed muscle tremors" .  Here describing a sensation of occipital neuralgia; the neurologic component being sudden onset of electric shock sensations, burning arising from the right occipital notch.  These are not lasting very long and they come often as a surprise so an abortive therapy is not possible.  The patient already takes Qulipta  as a migraine preventative every night and is on gabapentin  up to 4 times a day for neuropathic pain and postoperative pain.  I think he can hopefully cut down on the daytime gabapentin  need and instead use 2 tablets at night to prevent the occipital  headache or neuralgia from becoming a sleep interrupting factor.  I would however restrict the daytime dose to 1 a day.    Social HX : Sister suffers from Leukemia.      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 declined condoms  Other Topics Concern   Not on file  Social History Narrative   Sister April stays with him currently although he hopes to return to truck driving. 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 Sister is starting medical school next year.   Patient is divorced and lives with his sister.   Patient has one adult son.   Patient is part-time, driving a truck.   Patient has a college education.   Patient is right-handed.   Patient drinks 2-3 cups of coffee and a 4-5 cups of tea.   Social Drivers of Health   Financial Resource Strain: Patient Declined (07/17/2023)   Overall Financial Resource Strain (CARDIA)    Difficulty of Paying Living Expenses: Patient declined   Food Insecurity: Patient Declined (07/17/2023)   Hunger Vital Sign    Worried About Running Out of Food in the Last Year: Patient declined    Ran Out of Food in the Last Year: Patient declined  Transportation Needs: Patient Declined (07/17/2023)   PRAPARE - Administrator, Civil Service (Medical): Patient declined    Lack of Transportation (Non-Medical): Patient declined  Physical Activity: Sufficiently Active (07/17/2023)   Exercise Vital Sign    Days of Exercise per Week: 5 days    Minutes of Exercise per Session: 30 min  Stress: No Stress Concern Present (07/17/2023)   [facility]y [facility] [employer] - Occupational Stress Questionnaire    Feeling of Stress : Not at all  Social Connections: Moderately Integrated (07/17/2023)   Social Connection and Isolation Panel [NHANES]    Frequency of Communication with Friends and Family: More than three times a week    Frequency of Social Gatherings with Friends and Family: Three times a week    Attends Religious Services: More than 4 times per year    Active Member of Clubs or Organizations: Yes    Attends Engineer, structural: More than 4 times per year    Marital Status: Divorced    Family History  Problem Relation Age of Onset   Leukemia Mother    Stroke Father    Diabetes Father    Cancer Father        brain cancer   Asthma Father    Hypertension Sister    Asthma Sister    Prostate cancer Maternal Uncle    Colon cancer Neg Hx    Colon polyps Neg Hx    Esophageal cancer Neg Hx    Stomach cancer Neg Hx    Rectal cancer Neg Hx     Past Medical History:  Diagnosis Date   Asthma    very rare   Chronic anemia    normocytic   Chronic folliculitis    Chronic kidney disease    Stage 2   COPD (chronic obstructive pulmonary disease) (HCC)    Dyspnea    Environmental and seasonal allergies    Glaucoma    right   H/O pericarditis    2010--  myopercarditis--  resolved   Headache(784.0)    HX SEVERE  FRONTAL HA'S   Hiatal hernia    History of gastric ulcer    nonbleeding gastric ulcer 58//24   History of kidney stones    History of MRSA infection 2010   infected boil   HIV (human immunodeficiency virus infection) (HCC)  1988   Hypertension    Hypertensive cardiomyopathy (HCC)    EF 37%, normal coronaries 04/2020 [facility]; LVEF 60-65% 03/2022 echo   Lytic bone lesion of hip    WORK-UP DONE BY ONCOLOGIST DR HA --  NOT MALIGNANT   Nausea & vomiting 09/04/2023   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   Pneumonia    hx of   Post concussion syndrome    resolved   Prostate cancer (HCC) 04/25/2013   gleason 3+3=6, volume 30 gm   Viral meningitis    06/2021    Past Surgical History:  Procedure Laterality Date   ACDCF  09/29/2020   ANTERIOR CERVICAL DECOMPRESSION/DISCECTOMY FUSION 4 LEVELS N/A 09/29/2020   Procedure: ANTERIOR CERVICAL DECOMPRESSION FUSION CERVICAL 3-CERVICAL 4 , CERVICAL 4 - CERVICAL 5, CERVICAL 5 - CERVICAL 6 WITH INSTRUMENTATION AND ALLOGRAFT;  Surgeon: Virl Grimes, MD;  Location: [facility] OR;  Service: Orthopedics;  Laterality: N/A;   CARDIAC CATHETERIZATION  03-23-2009  DR COOPER   NORMAL CORONARY ARTERIES   CARPAL TUNNEL RELEASE Right 08/09/2023   Procedure: RIGHT CARPAL TUNNEL RELEASE;  Surgeon: Brunilda Capra, MD;  Location: [facility];  Service: Orthopedics;  Laterality: Right;  75 MIN   CHOLECYSTECTOMY N/A 11/06/2014   Procedure: LAPAROSCOPIC CHOLECYSTECTOMY WITH INTRAOPERATIVE CHOLANGIOGRAM;  Surgeon: Jerryl Morin, MD;  Location: [facility] OR;  Service: General;  Laterality: N/A;   COLONOSCOPY  12/26/2011   Procedure: COLONOSCOPY;  Surgeon: Yvetta Herbert, MD;  Location: [facility];  Service: Endoscopy;  Laterality: N/A;   COLONOSCOPY     COLONOSCOPY WITH PROPOFOL  N/A 07/29/2014   Procedure: COLONOSCOPY WITH PROPOFOL ;  Surgeon: Yvetta Herbert, MD;  Location: [facility]COPY;  Service: Endoscopy;  Laterality: N/A;   CYSTOSCOPY N/A 05/18/2018   Procedure:  CYSTOSCOPY REMOVAL FOREIGN BODY;  Surgeon: Ottelin, Mark, MD;  Location: [facility];  Service: Urology;  Laterality: N/A;   CYSTOSCOPY/RETROGRADE/URETEROSCOPY Bilateral 04/25/2013   Procedure: CYSTOSCOPY/ BILATERAL RETROGRADES; BLADDER BIOPSIES;  Surgeon: Casey Blaze, MD;  Location: [facility];  Service: Urology;  Laterality: Bilateral;   DENTAL EXAMINATION UNDER ANESTHESIA     ESOPHAGOGASTRODUODENOSCOPY  12/26/2011   Procedure: ESOPHAGOGASTRODUODENOSCOPY (EGD);  Surgeon: Yvetta Herbert, MD;  Location: [facility];  Service: Endoscopy;  Laterality: N/A;   ESOPHAGOGASTRODUODENOSCOPY (EGD) WITH PROPOFOL  N/A 07/29/2014   Procedure: ESOPHAGOGASTRODUODENOSCOPY (EGD) WITH PROPOFOL ;  Surgeon: Yvetta Herbert, MD;  Location: [facility];  Service: Endoscopy;  Laterality: N/A;   EXCISION CHRONIC LEFT BREAST ABSCESS  09-21-2010   EXCISIONAL BX LEFT BREAST MASS/  I  &  D LEFT BREAST ABSCESS  03-24-2009   IR ANGIO INTRA EXTRACRAN SEL INTERNAL CAROTID BILAT MOD SED  10/23/2023   IR ANGIO VERTEBRAL SEL VERTEBRAL BILAT MOD SED  10/23/2023   KNEE ARTHROSCOPY Right 1985   left axilla biopsy  04/2013   left hip biopsy  10/2012   LYMPHADENECTOMY Bilateral 08/18/2013   Procedure: LYMPHADENECTOMY "PELVIC LYMPH NODE DISSECTION";  Surgeon: Casey Blaze, MD;  Location: [facility];  Service: Urology;  Laterality: Bilateral;   POSTERIOR CERVICAL FUSION/FORAMINOTOMY N/A 11/30/2022   Procedure: POSTERIOR DECOMPRESSION AND FUSION CERVICAL 4- CERVICAL 5, CERVICAL 5- CERVICAL 6, CERVICAL 6- CERVICAL 7, CERVICAL 7- THORACIC 1 WITH INSTRUMENTATION AND ALLOGRAFT;  Surgeon: Virl Grimes, MD;  Location: [facility];  Service: Orthopedics;  Laterality: N/A;   PROSTATE BIOPSY N/A 04/25/2013   Procedure: BIOPSY TRANSRECTAL ULTRASONIC PROSTATE (TUBP);  Surgeon: Casey Blaze, MD;  Location: [facility];  Service: Urology;  Laterality: N/A;  ROBOT ASSISTED LAPAROSCOPIC RADICAL PROSTATECTOMY N/A 08/18/2013    Procedure: ROBOTIC ASSISTED LAPAROSCOPIC RADICAL PROSTATECTOMY;  Surgeon: Casey Blaze, MD;  Location: [facility];  Service: Urology;  Laterality: N/A;   SHOULDER ARTHROSCOPY WITH ROTATOR CUFF REPAIR AND SUBACROMIAL DECOMPRESSION Left 03/13/2023   Procedure: SHOULDER ARTHROSCOPY WITH ROTATOR CUFF REPAIR AND SUBACROMIAL DECOMPRESSION, DISTAL CLAVICLECTOMY, BICEPS TENODESIS;  Surgeon: Saundra Curl, MD;  Location: [facility];  Service: Orthopedics;  Laterality: Left;   TOTAL KNEE ARTHROPLASTY Right 08/01/2016   08/17/16   TOTAL KNEE ARTHROPLASTY Right 08/01/2016   Procedure: TOTAL KNEE ARTHROPLASTY;  Surgeon: Saundra Curl, MD;  Location: [facility];  Service: Orthopedics;  Laterality: Right;   ULNAR NERVE TRANSPOSITION Right 08/09/2023   Procedure: RIGHT ULNAR NERVE DECOMPRESSION AT ELBOW, POSSIBLE TRANSPOSITION;  Surgeon: Brunilda Capra, MD;  Location: [facility];  Service: Orthopedics;  Laterality: Right;   UPPER GASTROINTESTINAL ENDOSCOPY     UPPER LEG SOFT TISSUE BIOPSY Left 2012   lthigh     Current Outpatient Medications on File Prior to Visit  Medication Sig Dispense Refill   albuterol  (PROVENTIL ) (2.5 MG/3ML) 0.083% nebulizer solution USE 1 VIAL VIA NEBULIZER EVERY 6 HOURS AS NEEDED FOR WHEEZING OR SHORTNESS OF BREATH 75 mL 3   albuterol  (VENTOLIN  HFA) 108 (90 Base) MCG/ACT inhaler Inhale 2 puffs into the lungs every 6 (six) hours as needed for wheezing or shortness of breath. 36 g 3   ALPRAZolam  (XANAX ) 0.5 MG tablet TAKE 1 TABLET(0.5 MG) BY MOUTH TWICE DAILY AS NEEDED FOR SLEEP 60 tablet 2   azelastine  (ASTELIN ) 0.1 % nasal spray Place 2 sprays into both nostrils in the morning. Use in each nostril as directed     budeson-glycopyrrolate -formoterol  (BREZTRI  AEROSPHERE) 160-9-4.8 MCG/ACT AERO Inhale 2 puffs into the lungs in the morning and at bedtime. 10.7 g 6   cabotegravir  & rilpivirine  ER (CABENUVA ) 600 & 900 MG/3ML injection Inject into the muscle once a  month for two months then every other month 6 mL 0   cefadroxil  (DURICEF) 500 MG capsule Take 1 capsule (500 mg total) by mouth 2 (two) times daily. 10 capsule 0   cetirizine  (ZYRTEC ) 10 MG tablet TAKE 1 TABLET(10 MG) BY MOUTH DAILY 90 tablet 3   Cholecalciferol  (VITAMIN D3) 250 MCG (10000 UT) capsule Take 1 tablet by mouth daily. [employer]     diltiazem  (CARDIZEM ) 30 MG tablet Take 1 tablet (30 mg total) by mouth 4 (four) times daily as needed (for heart racing/palpitations). 120 tablet 1   diphenhydrAMINE  (BENADRYL ) 25 MG tablet Take 1 tablet (25 mg total) by mouth every 6 (six) hours as needed. (Patient taking differently: Take 25 mg by mouth every 6 (six) hours as needed for sleep, itching or allergies.) 30 tablet 0   dorzolamide  (TRUSOPT ) 2 % ophthalmic solution Place 1 drop into both eyes 2 (two) times daily.     EPINEPHrine  0.3 mg/0.3 mL IJ SOAJ injection INJECT 1 PEN IN THE MUSCLE ONE TIME AS DIRECTED AS NEEDED FOR ANAPHYLAXIS 2 each 3   famotidine  (PEPCID ) 40 MG tablet Take 1 tablet (40 mg total) by mouth at bedtime. 90 tablet 3   gabapentin  (NEURONTIN ) 300 MG capsule TAKE 1 CAPSULE(300 MG) BY MOUTH FOUR TIMES DAILY AS NEEDED FOR PAIN 120 capsule 4   Glucos-Chond-Hyal Ac-Ca Fructo (MOVE FREE JOINT HEALTH ADVANCE PO) Take 1 tablet by mouth daily.     HYDROcodone -acetaminophen  (NORCO/VICODIN) 5-325 MG tablet 1-2 tabs PO q6 hours prn pain 20 tablet  0   hydrOXYzine  (ATARAX ) 50 MG tablet TAKE 1 TABLET(50 MG) BY MOUTH TWICE DAILY 180 tablet 0   isosorbide  dinitrate (ISORDIL ) 20 MG tablet Take 1 tablet (20 mg total) by mouth 3 (three) times daily. 270 tablet 1   meclizine  (ANTIVERT ) 25 MG tablet Take 1 tablet (25 mg total) by mouth daily as needed. 40 tablet 5   methocarbamol  (ROBAXIN ) 500 MG tablet Take 1-2 tablets (500-1,000 mg total) by mouth every 6 (six) hours as needed for muscle spasms. 30 tablet 2   metoprolol  succinate (TOPROL -XL) 100 MG 24 hr tablet Take 1 tablet (100 mg total) by  mouth daily. Take with or immediately following a meal. 90 tablet 3   montelukast  (SINGULAIR ) 10 MG tablet Take 10 mg by mouth daily.     Multiple Vitamin (MULTIVITAMIN WITH MINERALS) TABS tablet Take 1 tablet by mouth daily after breakfast.      mupirocin  ointment (BACTROBAN ) 2 % APPLY EXTERNALLY TO THE AFFECTED AREA TWICE DAILY FOR 3 WEEKS, PLACE COTTON BETWEEN TOES TO ALLOW BETTER AERATION AS NEEDED (Patient taking differently: Apply 1 application  topically daily as needed (affected toes).) 66 g 1   ondansetron  (ZOFRAN -ODT) 4 MG disintegrating tablet Take 1 tablet (4 mg total) by mouth every 8 (eight) hours as needed for nausea or vomiting. (Patient taking differently: Take 4 mg by mouth every 4 (four) hours as needed for nausea or vomiting.) 30 tablet 2   OVER THE COUNTER MEDICATION Take 2 capsules by mouth daily. Zhou: thyroid  supplement     pantoprazole  (PROTONIX ) 40 MG tablet Take 1 tablet (40 mg total) by mouth 2 (two) times daily before a meal. 180 tablet 3   Prucalopride Succinate  (MOTEGRITY ) 2 MG TABS Take 1 tablet (2 mg total) by mouth daily. 30 tablet 5   QNASL  80 MCG/ACT AERS USE 2 SPRAYS IN EACH NOSTRIL DAILY (Patient taking differently: Place 2 sprays into both nostrils in the morning and at bedtime.) 10.6 g 2   QULIPTA  30 MG TABS TAKE 1 TABLET(30 MG) BY MOUTH DAILY 30 tablet 5   rosuvastatin  (CRESTOR ) 20 MG tablet TAKE 1 TABLET(20 MG) BY MOUTH DAILY 90 tablet 3   tizanidine  (ZANAFLEX ) 6 MG capsule Take 1 capsule (6 mg total) by mouth 3 (three) times daily. 90 capsule 3   torsemide  (DEMADEX ) 20 MG tablet Take 10 mg by mouth See admin instructions. Tuesday Thursday Saturday     Ubrogepant  (UBRELVY ) 100 MG TABS Take 1 tablet (100 mg total) by mouth as needed (take 1 tablet at onset of migraine. may take an additional tablet 2 hrs later if needed (Do not exceed more than 2 tablets in 24 hour period)). 16 tablet 5   nitroGLYCERIN  (NITROSTAT ) 0.4 MG SL tablet Place 1 tablet (0.4 mg total)  under the tongue every 5 (five) minutes as needed for chest pain. 30 tablet 1   [DISCONTINUED] zolpidem  (AMBIEN ) 5 MG tablet Take 1-2 tablets (5-10 mg total) by mouth at bedtime as needed for sleep. (Patient not taking: Reported on 04/23/2019) 30 tablet 2   Current Facility-Administered Medications on File Prior to Visit  Medication Dose Route Frequency Provider Last Rate Last Admin   cabotegravir  & rilpivirine  ER (CABENUVA ) 600 & 900 MG/3ML injection 1 kit  1 kit Intramuscular Q2 months    1 kit at 11/12/23 1122    Allergies  Allergen Reactions   Amoxapine Shortness Of Breath   Chocolate Shortness Of Breath and Other (See Comments)    Asthma attack, welts  Ciprofloxacin  Anaphylaxis, Dermatitis, Itching, Nausea And Vomiting, Shortness Of Breath, Swelling and Hives   Descovy  [Emtricitabine -Tenofovir  Af] Shortness Of Breath    Nasuea, vomiiting, diarrhea, sob, whelps, throat closes   Doxycycline  Hyclate Shortness Of Breath and Swelling   Genvoya  [Elviteg-Cobic-Emtricit-Tenofaf] Shortness Of Breath    Nausea, vomitting, diarreha, sob, rash, throat closing   Morphine  And Codeine Shortness Of Breath, Itching and Other (See Comments)     chest pain   Other Anaphylaxis   Oxycodone -Acetaminophen  Shortness Of Breath   Penicillins Shortness Of Breath and Rash    Has patient had a PCN reaction causing immediate rash, facial/tongue/throat swelling, SOB or lightheadedness with hypotension: Yes Has patient had a PCN reaction causing severe rash involving mucus membranes or skin necrosis: No Has patient had a PCN reaction that required hospitalization No Has patient had a PCN reaction occurring within the last 10 years: Yes If all of the above answers are "NO", then may proceed with Cephalosporin use.   Sulfa Antibiotics Anaphylaxis and Other (See Comments)   Tomato Shortness Of Breath   Vancomycin  Shortness Of Breath and Other (See Comments)   Darunavir  Itching   Amlodipine  Other (See Comments)     swelling   Carvedilol      LEG SWELLING, DIZZNESS   Flagyl  [Metronidazole ] Itching   Hydralazine      Dizziness and leg swelling    Meloxicam     Milk-Related Compounds    Morphine  Other (See Comments)   Oxycodone  Hcl Other (See Comments)   Penicillin G Other (See Comments)   Toradol  [Ketorolac  Tromethamine ] Nausea And Vomiting    itching   Acetaminophen  Rash and Other (See Comments)    Pt tolerates percocet and also plain APAP without rash   Reglan  [Metoclopramide ]     Tremors   Tramadol  Itching, Nausea And Vomiting and Other (See Comments)     DIAGNOSTIC DATA (LABS, IMAGING, TESTING) - I reviewed patient records, labs, notes, testing and imaging myself where available.  Lab Results  Component Value Date   WBC 3.9 (L) 10/23/2023   HGB 9.4 (L) 10/23/2023   HCT 29.6 (L) 10/23/2023   MCV 95.8 10/23/2023   PLT 177 10/23/2023      Component Value Date/Time   NA 140 10/23/2023 0740   NA 138 06/11/2023 1518   NA 142 02/24/2013 0848   K 3.3 (L) 10/23/2023 0740   K 3.4 (L) 02/24/2013 0848   CL 109 10/23/2023 0740   CO2 26 10/23/2023 0740   CO2 24 02/24/2013 0848   GLUCOSE 96 10/23/2023 0740   GLUCOSE 121 02/24/2013 0848   GLUCOSE 93 03/25/2009 0000   BUN 10 10/23/2023 0740   BUN 8 06/11/2023 1518   BUN 11.9 02/24/2013 0848   CREATININE 1.29 (H) 10/23/2023 0740   CREATININE 1.24 11/11/2020 1526   CREATININE 1.3 02/24/2013 0848   CALCIUM  8.7 (L) 10/23/2023 0740   CALCIUM  8.9 02/24/2013 0848   PROT 7.1 09/04/2023 1633   PROT 6.7 06/11/2023 1518   PROT 7.1 02/24/2013 0848   ALBUMIN  4.1 09/04/2023 1633   ALBUMIN  4.0 06/11/2023 1518   ALBUMIN  3.6 02/24/2013 0848   AST 27 09/04/2023 1633   AST 14 02/24/2013 0848   ALT 28 09/04/2023 1633   ALT 11 02/24/2013 0848   ALKPHOS 101 09/04/2023 1633   ALKPHOS 94 02/24/2013 0848   BILITOT 0.5 09/04/2023 1633   BILITOT 1.1 06/11/2023 1518   BILITOT 1.40 (H) 02/24/2013 0848   GFRNONAA >60 10/23/2023 0740   GFRNONAA 70  12/04/2018 1543   GFRAA >60 05/08/2019 0504   GFRAA 82 12/04/2018 1543   Lab Results  Component Value Date   CHOL 123 06/11/2023   HDL 57 06/11/2023   LDLCALC 49 06/11/2023   TRIG 90 06/11/2023   CHOLHDL 2.2 06/11/2023   Lab Results  Component Value Date   HGBA1C <4.2 08/15/2022   Lab Results  Component Value Date   VITAMINB12 458 08/14/2022   Lab Results  Component Value Date   TSH 2.51 08/14/2022    PHYSICAL EXAM:  Today's Vitals   11/22/23 1036  BP: (!) 142/98  Pulse: 74  Weight: 231 lb 12.8 oz (105.1 kg)  Height: 5\' 9"  (1.753 m)   Body mass index is 34.23 kg/m.   Wt Readings from Last 3 Encounters:  11/22/23 231 lb 12.8 oz (105.1 kg)  10/23/23 215 lb (97.5 kg)  10/01/23 232 lb (105.2 kg)     Ht Readings from Last 3 Encounters:  11/22/23 5\' 9"  (1.753 m)  10/23/23 5\' 9"  (1.753 m)  10/01/23 5\' 9"  (1.753 m)      General: The patient is awake, alert and appears not in acute distress.  The patient is well groomed. Head: Normocephalic, atraumatic.. Cardiovascular:  Regular rate and cardiac rhythm by pulse,  without distended neck veins. Respiratory: Lungs are clear to auscultation.  Skin:  Without evidence of ankle edema, or rash. Trunk: The patient's posture is erect.   NEUROLOGIC EXAM: The patient is awake and alert, oriented to place and time.   Memory subjective described as intact.  Attention span & concentration ability appears normal.  Speech is fluent,  without  dysarthria, dysphonia or aphasia.  Mood and affect are appropriate.   Cranial nerves: no loss of smell or taste reported  Pupils are equal and briskly reactive to light.  Extraocular movements in vertical and horizontal planes were intact and without nystagmus. No Diplopia. Visual fields by finger perimetry are intact. Hearing was intact to soft voice and finger rubbing.    Facial sensation intact to fine touch.  Facial motor strength is symmetric and tongue and uvula move midline.   Neck ROM : rotation, tilt and flexion extension were normal for age and shoulder shrug was symmetrical.     ASSESSMENT AND PLAN :  Here describing a sensation of occipital neuralgia; the neurologic component being sudden onset of electric shock sensations, burning arising from the right occipital notch.  These are not lasting very long and they come often as a surprise so an abortive therapy is not possible.  The patient already takes Qulipta  as a migraine preventative every night and is on gabapentin  up to 4 times a day for neuropathic pain and postoperative pain.  I think he can hopefully cut down on the daytime gabapentin  need and instead use 2 tablets at night to prevent the occipital headache or neuralgia from becoming a sleep interrupting factor.  I would however restrict the daytime dose to 1 a day.  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 10-31-2023 by Dr Huntley Mai.     Dx today : Occipital neuralgia post posterior cervical decompression , C2-3.    1) gabapentin  is already prescribed, take one in AM and 2 at night time,   Qulipta  can be changed to prn use.   2) hydroxyzine  at night is used as a sleep aid. like for him to reduce his xanax  only prn. Lets start and take it only 1/2 tab bid then go to 1/2  tab day prn.   PRN RV.      After spending a total time of  40  minutes face to face and additional time for physical and neurologic examination, review of laboratory studies,  personal review of imaging studies, reports and results of other testing and review of referral information / records as far as provided in visit,   Electronically signed by: Neomia Banner, MD 11/22/2023 10:59 AM  [facility] Board certified by The [employer] of Sleep Medicine and Diplomate of the [employer] of [employer] Medicine. Board certified In Neurology through the ABPN, Fellow of the [employer] of Neu[employer].

## 2023-11-22 NOTE — Patient Instructions (Addendum)
 Here describing a sensation of occipital neuralgia; the neurologic component being sudden onset of electric shock sensations, burning arising from the right occipital notch.  These are not lasting very long and they come often as a surprise so an abortive therapy is not possible.  The patient already takes Qulipta  as a migraine preventative every night and is on gabapentin  up to 4 times a day for neuropathic pain and postoperative pain.  I think he can hopefully cut down on the daytime gabapentin  need and instead use 2 tablets at night to prevent the occipital headache or neuralgia from becoming a sleep interrupting factor.  I would however restrict the daytime dose to 1 a day.   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 10-31-2023 by Dr Huntley Mai.       Dx today : Occipital neuralgia post posterior cervical decompression , C2-3.    1) gabapentin  is already prescribed, take one in AM and 2 at night time,   Qulipta  can be changed to prn use.    2) hydroxyzine  at night is used as a sleep aid.   like for him to reduce his xanax  , I asked him to take only 1/2 tab prn bid.  PRN RV.

## 2023-11-24 LAB — NM PET CT MYOCARDIAL SARCOIDOSIS
LV dias vol: 94 mL (ref 62–150)
LV sys vol: 42 mL
Nuc Stress EF: 55 %
Rest Nuclear Isotope Dose: 25.3 mCi

## 2023-11-27 ENCOUNTER — Other Ambulatory Visit: Payer: Self-pay | Admitting: Neurology

## 2023-11-28 ENCOUNTER — Ambulatory Visit (HOSPITAL_BASED_OUTPATIENT_CLINIC_OR_DEPARTMENT_OTHER): Admitting: Primary Care

## 2023-11-28 ENCOUNTER — Encounter (HOSPITAL_BASED_OUTPATIENT_CLINIC_OR_DEPARTMENT_OTHER): Payer: Self-pay | Admitting: Primary Care

## 2023-11-28 VITALS — BP 135/86 | HR 88 | Ht 69.0 in | Wt 237.4 lb

## 2023-11-28 DIAGNOSIS — Z87891 Personal history of nicotine dependence: Secondary | ICD-10-CM | POA: Diagnosis not present

## 2023-11-28 DIAGNOSIS — K200 Eosinophilic esophagitis: Secondary | ICD-10-CM | POA: Diagnosis not present

## 2023-11-28 DIAGNOSIS — K3184 Gastroparesis: Secondary | ICD-10-CM

## 2023-11-28 DIAGNOSIS — J4489 Other specified chronic obstructive pulmonary disease: Secondary | ICD-10-CM

## 2023-11-28 DIAGNOSIS — I455 Other specified heart block: Secondary | ICD-10-CM | POA: Diagnosis not present

## 2023-11-28 NOTE — Progress Notes (Signed)
 @Patient  ID: Casey Brennan, male    DOB: [birth year], 63 y.o.   MRN: [id number]  No chief complaint on file.   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   HIV - diagnosed 18 ,last CD 4 479 03/2020 Viral meningits He smoked more than 30 pack years before he quit in 2012 -schatzki ring - dilated    Previous [facility] pulmonary encounter: 05/24/23- Dr. Villa Greaser  Acute OV -accompanied by sister who corroborates history  pt c/o wheezing, cough and fatigue over the past wk  He seen PCP and was given pred, doxy and tussionex  He has finished meds and still has prod cough with light yellow sputum  Still has a lot of wheezing   He underwent shoulder and neck surgery this year.  He has gained weight due to prednisone .  He has an EGD planned for tomorrow. Multiple allergies are noted CXR 11/7 clear LDCT chest to my review shows clear lung fields    11/28/2023- Interim hx   Discussed the use of AI scribe software for clinical note transcription with the patient, who gave verbal consent to proceed.  History of Present Illness   Casey Brennan "Baker Bon" is a 63 year old male presents today with his sister for 6 month follow-up/ Mild OSA and moderate-severe COPD. Patient was last seen in November for an acute flare up treated with keflex . Maintained on Breztri . He was referred by Dr. 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  He has been experiencing persistent wheezing for the past year, with daily occurrences and exacerbations noted at night before bed and in the morning upon waking. The wheezing slows him down at times. He uses nebulizers with albuterol , which provide some relief. His current medications include Breztri , two  puffs twice daily, and Ventolin  as needed.  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 Previously, he was on pantoprazole , famotidine , and prucalopride, but is currently off prucalopride due to insurance issues. His eosinophil count was 300 in April, down from 500. He also has gastroparesis, contributing to abdominal swelling.  He has COPD and is currently on Breztri . He has been on multiple courses of prednisone , which has contributed to weight gain. He was last on antibiotics for bronchitis in March, and a chest x-ray at that time showed low lung volumes. He takes torsemide  every other day as needed to manage fluid retention.  His past medical history includes severe obstructive airway disease with an asthmatic component. He had a breathing test last July. He takes cetirizine  and Singulair  for allergies. He has had multiple surgeries on his neck, left shoulder, right arm, and hand, and has been on prednisone  to manage inflammation related to these conditions.  Family history includes concerns about congestive heart failure, but recent cardiac evaluations, including an echocardiogram and PET scan, showed no evidence of heart failure. He had a run of ventricular tachycardia in March and a complete heart block, for which he is seeing Dr. Harvie Liner for electrophysiology evaluation.          Significant tests/ events reviewed   10/2022 CT c/A/P >> unchanged nodule, 3 mm right upper lobe cystic bronchiectasis, unchanged -2 subcentimeter left iliac lytic lesions -  unchanged 02/26/22 CTA chest >> RUL 4mm nodule, likely LN CT cors 04/2020 ca score 0     PFT 01/2023 moderate airway obstruction, ratio 59, FEV1 1.41/40%, FVC 51%, positive bronchodilator response, DLCO 81%   spirometry 07/22/2014  FEV1  1.12 (34%) ratio 59  AEC 900   Home sleep test 07/2021 mild, AHI 8/hour, low sat 88%, worse when supine    Allergies  Allergen Reactions   Amoxapine Shortness Of  Breath   Chocolate Shortness Of Breath and Other (See Comments)    Asthma attack, welts   Ciprofloxacin  Anaphylaxis, Dermatitis, Itching, Nausea And Vomiting, Shortness Of Breath, Swelling and Hives   Descovy  [Emtricitabine -Tenofovir  Af] Shortness Of Breath    Nasuea, vomiiting, diarrhea, sob, whelps, throat closes   Doxycycline  Hyclate Shortness Of Breath and Swelling   Genvoya  [Elviteg-Cobic-Emtricit-Tenofaf] Shortness Of Breath    Nausea, vomitting, diarreha, sob, rash, throat closing   Morphine  And Codeine Shortness Of Breath, Itching and Other (See Comments)     chest pain   Other Anaphylaxis   Oxycodone -Acetaminophen  Shortness Of Breath   Penicillins Shortness Of Breath and Rash    Has patient had a PCN reaction causing immediate rash, facial/tongue/throat swelling, SOB or lightheadedness with hypotension: Yes Has patient had a PCN reaction causing severe rash involving mucus membranes or skin necrosis: No Has patient had a PCN reaction that required hospitalization No Has patient had a PCN reaction occurring within the last 10 years: Yes If all of the above answers are "NO", then may proceed with Cephalosporin use.   Sulfa Antibiotics Anaphylaxis and Other (See Comments)   Tomato Shortness Of Breath   Vancomycin  Shortness Of Breath and Other (See Comments)   Darunavir  Itching   Amlodipine  Other (See Comments)    swelling   Carvedilol      LEG SWELLING, DIZZNESS   Flagyl  [Metronidazole ] Itching   Hydralazine      Dizziness and leg swelling    Meloxicam     Milk-Related Compounds    Morphine  Other (See Comments)   Oxycodone  Hcl Other (See Comments)   Penicillin G Other (See Comments)   Toradol  [Ketorolac  Tromethamine ] Nausea And Vomiting    itching   Acetaminophen  Rash and Other (See Comments)    Pt tolerates percocet and also plain APAP without rash   Reglan  [Metoclopramide ]     Tremors   Tramadol  Itching, Nausea And Vomiting and Other (See Comments)    Immunization  History  Administered Date(s) Administered   Hepatitis A 06/12/2011   Influenza Split 06/12/2012   Influenza Whole 04/19/2009, 07/25/2010, 02/22/2011   Influenza, High Dose Seasonal PF 06/02/2021   Influenza,inj,Quad PF,6+ Mos 06/04/2013, 04/27/2014, 07/21/2015, 05/22/2016, 04/27/2017, 05/20/2020, 04/06/2022   Influenza-Unspecified 01/30/2015   Meningococcal Mcv4o 05/22/2016   PFIZER Comirnaty(Gray Top)Covid-19 Tri-Sucrose Vaccine 05/25/2020   PFIZER(Purple Top)SARS-COV-2 Vaccination 10/23/2019, 11/13/2019, 05/25/2020   PPD Test 05/05/2009   Pfizer Covid-19 Vaccine Bivalent Booster 59yrs & up 04/08/2021   Pneumococcal Conjugate-13 11/11/2020   Pneumococcal Polysaccharide-23 03/22/2009, 08/19/2013, 11/15/2020   Tdap 06/12/2011   Zoster Recombinant(Shingrix ) 12/27/2020, 04/06/2022, 06/14/2022    Past Medical History:  Diagnosis Date   Asthma    very rare   Chronic anemia    normocytic   Chronic folliculitis    Chronic kidney disease    Stage 2   COPD (chronic obstructive pulmonary disease) (HCC)    Dyspnea    Environmental and seasonal allergies    Glaucoma    right   H/O pericarditis    2010--  myopercarditis--  resolved   Headache(784.0)    HX SEVERE FRONTAL HA'S   Hiatal hernia    History of gastric ulcer    nonbleeding gastric ulcer 58//24   History of kidney stones    History of MRSA infection 2010   infected boil   HIV (human immunodeficiency virus infection) (HCC) 1988   Hypertension    Hypertensive cardiomyopathy (HCC)    EF 37%, normal coronaries 04/2020 CCTA; LVEF 60-65% 03/2022 echo   Lytic bone lesion of hip    WORK-UP DONE BY ONCOLOGIST DR HA --  NOT MALIGNANT   Nausea & vomiting 09/04/2023   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083% nebulizer solution USE 1 VIAL VIA NEBULIZER EVERY 6 HOURS AS NEEDED FOR WHEEZING OR SHORTNESS OF BREATH 75 mL 3   albuterol  (VENTOLIN  HFA) 108 (90 Base) MCG/ACT inhaler Inhale 2 puffs into the lungs every 6 (six) hours as needed for wheezing or shortness of breath. 36 g 3   ALPRAZolam  (XANAX ) 0.5 MG tablet TAKE 1/2 TABLET(0.25 MG) BY MOUTH TWICE DAILY AS NEEDED FOR SLEEP 30 tablet 2   azelastine  (ASTELIN ) 0.1 % nasal spray Place 2 sprays into both nostrils in the morning. Use in each nostril as directed     budeson-glycopyrrolate -formoterol  (BREZTRI  AEROSPHERE) 160-9-4.8 MCG/ACT AERO Inhale 2 puffs into the lungs in the morning and at bedtime. 10.7 g 6   cabotegravir  & rilpivirine  ER (CABENUVA ) 600 & 900 MG/3ML injection Inject into the muscle once a month for two months then every other month 6 mL 0   cefadroxil  (DURICEF) 500 MG capsule Take 1 capsule (500 mg total) by mouth 2 (two) times daily. 10 capsule 0   cetirizine  (ZYRTEC ) 10 MG tablet TAKE 1 TABLET(10 MG) BY MOUTH DAILY 90 tablet 3   Cholecalciferol  (VITAMIN D3) 250 MCG (10000 UT) capsule Take 1 tablet by mouth daily. [employer]'s Made     diltiazem  (CARDIZEM ) 30 MG tablet Take 1 tablet (30 mg total) by mouth 4 (four) times daily as needed (for heart racing/palpitations). 120 tablet 1   diphenhydrAMINE  (BENADRYL ) 25 MG tablet Take 1 tablet (25 mg total) by mouth every 6 (six) hours as needed. (Patient taking differently: Take 25 mg by mouth every 6 (six) hours as needed for sleep, itching or allergies.) 30 tablet 0   dorzolamide  (TRUSOPT ) 2 % ophthalmic  solution Place 1 drop into both eyes 2 (two) times daily.     EPINEPHrine  0.3 mg/0.3 mL IJ SOAJ injection INJECT 1 PEN IN THE MUSCLE ONE TIME AS DIRECTED AS NEEDED FOR ANAPHYLAXIS 2 each 3   famotidine  (PEPCID ) 40 MG tablet Take 1 tablet (40 mg total) by mouth at bedtime. 90 tablet 3   gabapentin  (NEURONTIN ) 300 MG capsule Use one in AM and 2 at bedtime for neuralgia 120 capsule 4  Glucos-Chond-Hyal Ac-Ca Fructo (MOVE FREE JOINT HEALTH ADVANCE PO) Take 1 tablet by mouth daily.     HYDROcodone -acetaminophen  (NORCO/VICODIN) 5-325 MG tablet 1-2 tabs PO q6 hours prn pain 20 tablet 0   hydrOXYzine  (ATARAX ) 50 MG tablet TAKE 1 TABLET(50 MG) BY MOUTH TWICE DAILY 180 tablet 0   isosorbide  dinitrate (ISORDIL ) 20 MG tablet Take 1 tablet (20 mg total) by mouth 3 (three) times daily. 270 tablet 1   meclizine  (ANTIVERT ) 25 MG tablet Take 1 tablet (25 mg total) by mouth daily as needed. 40 tablet 5   methocarbamol  (ROBAXIN ) 500 MG tablet Take 1-2 tablets (500-1,000 mg total) by mouth every 6 (six) hours as needed for muscle spasms. 30 tablet 2   metoprolol  succinate (TOPROL -XL) 100 MG 24 hr tablet Take 1 tablet (100 mg total) by mouth daily. Take with or immediately following a meal. 90 tablet 3   montelukast  (SINGULAIR ) 10 MG tablet Take 10 mg by mouth daily.     Multiple Vitamin (MULTIVITAMIN WITH MINERALS) TABS tablet Take 1 tablet by mouth daily after breakfast.      mupirocin  ointment (BACTROBAN ) 2 % APPLY EXTERNALLY TO THE AFFECTED AREA TWICE DAILY FOR 3 WEEKS, PLACE COTTON BETWEEN TOES TO ALLOW BETTER AERATION AS NEEDED (Patient taking differently: Apply 1 application  topically daily as needed (affected toes).) 66 g 1   nitroGLYCERIN  (NITROSTAT ) 0.4 MG SL tablet Place 1 tablet (0.4 mg total) under the tongue every 5 (five) minutes as needed for chest pain. 30 tablet 1   ondansetron  (ZOFRAN -ODT) 4 MG disintegrating tablet Take 1 tablet (4 mg total) by mouth every 8 (eight) hours as needed for nausea or  vomiting. (Patient taking differently: Take 4 mg by mouth every 4 (four) hours as needed for nausea or vomiting.) 30 tablet 2   OVER THE COUNTER MEDICATION Take 2 capsules by mouth daily. Zhou: thyroid  supplement     pantoprazole  (PROTONIX ) 40 MG tablet Take 1 tablet (40 mg total) by mouth 2 (two) times daily before a meal. 180 tablet 3   Prucalopride Succinate  (MOTEGRITY ) 2 MG TABS Take 1 tablet (2 mg total) by mouth daily. 30 tablet 5   QNASL  80 MCG/ACT AERS USE 2 SPRAYS IN EACH NOSTRIL DAILY (Patient taking differently: Place 2 sprays into both nostrils in the morning and at bedtime.) 10.6 g 2   QULIPTA  30 MG TABS Prn for migraine 30 tablet 5   rosuvastatin  (CRESTOR ) 20 MG tablet TAKE 1 TABLET(20 MG) BY MOUTH DAILY 90 tablet 3   tizanidine  (ZANAFLEX ) 6 MG capsule Take 1 capsule (6 mg total) by mouth 3 (three) times daily. 90 capsule 3   torsemide  (DEMADEX ) 20 MG tablet Take 10 mg by mouth See admin instructions. Tuesday Thursday Saturday     Ubrogepant  (UBRELVY ) 100 MG TABS Take 1 tablet (100 mg total) by mouth as needed (take 1 tablet at onset of migraine. may take an additional tablet 2 hrs later if needed (Do not exceed more than 2 tablets in 24 hour period)). 16 tablet 5   Facility-Administered Medications Prior to Visit  Medication Dose Route Frequency Provider Last Rate Last Admin   cabotegravir  & rilpivirine  ER (CABENUVA ) 600 & 900 MG/3ML injection 1 kit  1 kit Intramuscular Q2 months    1 kit at 11/12/23 1122      Review of Systems  Review of Systems   Physical Exam  There were no vitals taken for this visit. Physical Exam   Lab Results:  CBC  Component Value Date/Time   WBC 3.9 (L) 10/23/2023 0740   RBC 3.09 (L) 10/23/2023 0740   HGB 9.4 (L) 10/23/2023 0740   HGB 12.7 (L) 06/11/2023 1518   HGB 14.0 05/11/2015 1108   HCT 29.6 (L) 10/23/2023 0740   HCT 39.0 06/11/2023 1518   HCT 42.1 05/11/2015 1108   PLT 177 10/23/2023 0740   PLT 185 06/11/2023 1518   MCV 95.8  10/23/2023 0740   MCV 92 06/11/2023 1518   MCV 97.5 05/11/2015 1108   MCH 30.4 10/23/2023 0740   MCHC 31.8 10/23/2023 0740   RDW 16.6 (H) 10/23/2023 0740   RDW 13.1 06/11/2023 1518   RDW 12.9 05/11/2015 1108   LYMPHSABS 2.0 10/23/2023 0740   LYMPHSABS 1.6 06/11/2023 1518   LYMPHSABS 1.5 05/11/2015 1108   MONOABS 0.4 10/23/2023 0740   MONOABS 0.3 05/11/2015 1108   EOSABS 0.3 10/23/2023 0740   EOSABS 0.2 06/11/2023 1518   BASOSABS 0.0 10/23/2023 0740   BASOSABS 0.0 06/11/2023 1518   BASOSABS 0.0 05/11/2015 1108    BMET    Component Value Date/Time   NA 140 10/23/2023 0740   NA 138 06/11/2023 1518   NA 142 02/24/2013 0848   K 3.3 (L) 10/23/2023 0740   K 3.4 (L) 02/24/2013 0848   CL 109 10/23/2023 0740   CO2 26 10/23/2023 0740   CO2 24 02/24/2013 0848   GLUCOSE 96 10/23/2023 0740   GLUCOSE 121 02/24/2013 0848   GLUCOSE 93 03/25/2009 0000   BUN 10 10/23/2023 0740   BUN 8 06/11/2023 1518   BUN 11.9 02/24/2013 0848   CREATININE 1.29 (H) 10/23/2023 0740   CREATININE 1.24 11/11/2020 1526   CREATININE 1.3 02/24/2013 0848   CALCIUM  8.7 (L) 10/23/2023 0740   CALCIUM  8.9 02/24/2013 0848   GFRNONAA >60 10/23/2023 0740   GFRNONAA 70 12/04/2018 1543   GFRAA >60 05/08/2019 0504   GFRAA 82 12/04/2018 1543    BNP    Component Value Date/Time   BNP 11.7 06/28/2021 0132    ProBNP No results found for: "PROBNP"  Imaging: NM PET CT MYOCARDIAL SARCOIDOSIS Result Date: 11/24/2023   FDG uptake findings are inconsistent with active myocardial inflammation/sarcoidosis.   FDG uptake was not observed. LV perfusion is normal.   Left ventricular function is normal. EF: 55%. End diastolic cavity size is normal. End systolic cavity size is normal.   Coronary calcium  was absent on the attenuation correction CT images.   Electronically signed by: Euell Herrlich, MD EXAM: OVER-READ INTERPRETATION CARDIAC CT CHEST The following report is an over-read performed by radiologist Dr. Freida Jes of [facility] on 11/22/2023. This over-read does not include interpretation of cardiac or coronary anatomy or pathology. The cardiac and PET interpretation by the cardiologist is attached or will be attached. COMPARISON:  05/18/2023 FINDINGS: Extracardiac Vascular: Unremarkable Mediastinum: Unremarkable Lung: Unremarkable Included Upper Abdomen: Simple right kidney upper pole cyst. Also there is a right adrenal adenoma previously characterized 02/28/2013. No further imaging workup of these lesions is indicated. No further imaging workup of these lesions is indicated. Musculoskeletal: Unremarkable IMPRESSION: 1. No acute findings. 2. Right adrenal adenoma. No further imaging workup of this lesion is indicated. 3. Simple right kidney upper pole cyst. No further imaging workup of this lesion is indicated. Electronically Signed   By: Freida Jes M.D.   On: 11/22/2023 09:12  MR CARDIAC MORPHOLOGY W WO CONTRAST Result Date: 11/12/2023 CLINICAL DATA:  Clinical question of cardiac sarcoidosis Study assumes HCT  of 30 and BSA of 2.18 m2. EXAM: CARDIAC MRI TECHNIQUE: The patient was scanned on a 1.5 Tesla GE magnet. A dedicated cardiac coil was used. Functional imaging was done using Fiesta sequences. 2,3, and 4 chamber views were done to assess for RWMA's. Modified Simpson's rule using was used to calculate an ejection fraction on a dedicated work [employer]. The patient received 10 cc of Gadavist . After 10 minutes inversion recovery sequences were used to assess for infiltration and scar tissue. Flow quantification was performed 2 times during this examination with flow quantification performed at the levels of the ascending aorta above the valve, pulmonary artery above the valve. CONTRAST:  10 cc  of Gadavist  FINDINGS: 1. Normal left ventricular size, with LVEDD 55 mm, and LVEDVi 83 mL/m2. Normal left ventricular thickness, with intraventricular septal thickness of 9 mm,  posterior wall thickness of 5 mm, and myocardial mass index of 55 g/m2. Normal left ventricular systolic function (LVEF =55%). There are no regional wall motion abnormalities. Left ventricular parametric mapping notable for normal ECV. T2 signal elevated basal septal (70 ms). There is no late gadolinium enhancement in the left ventricular myocardium. Normal resting perfusion. 2. Normal right ventricular size with RVEDVI 81 mL/m2. Normal right ventricular thickness. Normal right ventricular systolic function (RVEF =50%). There are no regional wall motion abnormalities or aneurysms. 3. Normal right atrial size. Moderate left atrial dilation (indexed volume 42 ml/m2). No evidence of interatrial communications. 4. Normal size of the aortic root, ascending aorta and pulmonary artery. 5. Valve assessment: Aortic Valve: Tri-leaflet aortic valve. Qualitatively, there is no significant regurgitation. Regurgitant fraction 2%. Gradient 1.3 mm Hg. Pulmonic Valve: Qualitatively, there is no significant regurgitation. Regurgitant fraction < 1%. Tricuspid Valve: Qualitatively, there is no significant regurgitation. Regurgitant fraction < 1%. Mitral Valve: Qualitatively, there is mild regurgitation. Regurgitant fraction 6%. 6.  Normal pericardium.  No pericardial effusion. 7. Grossly, no extracardiac findings. Recommended dedicated study if concerned for non-cardiac pathology. IMPRESSION: 1.  Study is inconsistent with cardiac sarcoidosis. 2. Normal left ventricular systolic function (LVEF =55%). There are no regional wall motion abnormalities. 3.  Normal right ventricular systolic function (RVEF =50%). Gloriann Larger MD Electronically Signed   By: Gloriann Larger M.D.   On: 11/12/2023 17:34   MR CARDIAC VELOCITY FLOW MAP Result Date: 11/12/2023 CLINICAL DATA:  Clinical question of cardiac sarcoidosis Study assumes HCT of 30 and BSA of 2.18 m2. EXAM: CARDIAC MRI TECHNIQUE: The patient was scanned on a 1.5 Tesla GE  magnet. A dedicated cardiac coil was used. Functional imaging was done using Fiesta sequences. 2,3, and 4 chamber views were done to assess for RWMA's. Modified Simpson's rule using was used to calculate an ejection fraction on a dedicated work [employer] officer, [employer]. The patient received 10 cc of Gadavist . After 10 minutes inversion recovery sequences were used to assess for infiltration and scar tissue. Flow quantification was performed 2 times during this examination with flow quantification performed at the levels of the ascending aorta above the valve, pulmonary artery above the valve. CONTRAST:  10 cc  of Gadavist  FINDINGS: 1. Normal left ventricular size, with LVEDD 55 mm, and LVEDVi 83 mL/m2. Normal left ventricular thickness, with intraventricular septal thickness of 9 mm, posterior wall thickness of 5 mm, and myocardial mass index of 55 g/m2. Normal left ventricular systolic function (LVEF =55%). There are no regional wall motion abnormalities. Left ventricular parametric mapping notable for normal ECV. T2 signal elevated basal septal (70 ms). There  is no late gadolinium enhancement in the left ventricular myocardium. Normal resting perfusion. 2. Normal right ventricular size with RVEDVI 81 mL/m2. Normal right ventricular thickness. Normal right ventricular systolic function (RVEF =50%). There are no regional wall motion abnormalities or aneurysms. 3. Normal right atrial size. Moderate left atrial dilation (indexed volume 42 ml/m2). No evidence of interatrial communications. 4. Normal size of the aortic root, ascending aorta and pulmonary artery. 5. Valve assessment: Aortic Valve: Tri-leaflet aortic valve. Qualitatively, there is no significant regurgitation. Regurgitant fraction 2%. Gradient 1.3 mm Hg. Pulmonic Valve: Qualitatively, there is no significant regurgitation. Regurgitant fraction < 1%. Tricuspid Valve: Qualitatively, there is no significant regurgitation. Regurgitant fraction < 1%.  Mitral Valve: Qualitatively, there is mild regurgitation. Regurgitant fraction 6%. 6.  Normal pericardium.  No pericardial effusion. 7. Grossly, no extracardiac findings. Recommended dedicated study if concerned for non-cardiac pathology. IMPRESSION: 1.  Study is inconsistent with cardiac sarcoidosis. 2. Normal left ventricular systolic function (LVEF =55%). There are no regional wall motion abnormalities. 3.  Normal right ventricular systolic function (RVEF =50%). Gloriann Larger MD Electronically Signed   By: Gloriann Larger M.D.   On: 11/12/2023 17:34   MR CARDIAC VELOCITY FLOW MAP Result Date: 11/12/2023 CLINICAL DATA:  Clinical question of cardiac sarcoidosis Study assumes HCT of 30 and BSA of 2.18 m2. EXAM: CARDIAC MRI TECHNIQUE: The patient was scanned on a 1.5 Tesla GE magnet. A dedicated cardiac coil was used. Functional imaging was done using Fiesta sequences. 2,3, and 4 chamber views were done to assess for RWMA's. Modified Simpson's rule using was used to calculate an ejection fraction on a dedicated work [employer] officer, [employer]. The patient received 10 cc of Gadavist . After 10 minutes inversion recovery sequences were used to assess for infiltration and scar tissue. Flow quantification was performed 2 times during this examination with flow quantification performed at the levels of the ascending aorta above the valve, pulmonary artery above the valve. CONTRAST:  10 cc  of Gadavist  FINDINGS: 1. Normal left ventricular size, with LVEDD 55 mm, and LVEDVi 83 mL/m2. Normal left ventricular thickness, with intraventricular septal thickness of 9 mm, posterior wall thickness of 5 mm, and myocardial mass index of 55 g/m2. Normal left ventricular systolic function (LVEF =55%). There are no regional wall motion abnormalities. Left ventricular parametric mapping notable for normal ECV. T2 signal elevated basal septal (70 ms). There is no late gadolinium enhancement in the left ventricular  myocardium. Normal resting perfusion. 2. Normal right ventricular size with RVEDVI 81 mL/m2. Normal right ventricular thickness. Normal right ventricular systolic function (RVEF =50%). There are no regional wall motion abnormalities or aneurysms. 3. Normal right atrial size. Moderate left atrial dilation (indexed volume 42 ml/m2). No evidence of interatrial communications. 4. Normal size of the aortic root, ascending aorta and pulmonary artery. 5. Valve assessment: Aortic Valve: Tri-leaflet aortic valve. Qualitatively, there is no significant regurgitation. Regurgitant fraction 2%. Gradient 1.3 mm Hg. Pulmonic Valve: Qualitatively, there is no significant regurgitation. Regurgitant fraction < 1%. Tricuspid Valve: Qualitatively, there is no significant regurgitation. Regurgitant fraction < 1%. Mitral Valve: Qualitatively, there is mild regurgitation. Regurgitant fraction 6%. 6.  Normal pericardium.  No pericardial effusion. 7. Grossly, no extracardiac findings. Recommended dedicated study if concerned for non-cardiac pathology. IMPRESSION: 1.  Study is inconsistent with cardiac sarcoidosis. 2. Normal left ventricular systolic function (LVEF =55%). There are no regional wall motion abnormalities. 3.  Normal right ventricular systolic function (RVEF =50%). Gloriann Larger MD Electronically Signed   By:  Gloriann Larger M.D.   On: 11/12/2023 17:34   LONG TERM MONITOR (3-14 DAYS) Result Date: 11/02/2023 Zio patch monitor 14 days 10/08/2023 - 10/22/2023: Dominant rhythm: Sinus. HR 47-135 bpm. Avg HR 82 bpm. 1 episode of 3rd degree AV block lasting for 11 secs (occurred at 3:08 PM). No reported symptoms. 1 episode of VT, fastest at 154 bpm for 4 beats. No reported symptoms. <1% isolated VE, couplet/triplets. 0 episodes of SVT. 1.3% isolated SVE, <1% couplet/triplets. No atrial fibrillation/atrial flutter/SVT/, sinus pause >3sec noted. 2 patient triggered events, correlated with sinus rhythm.   ECHOCARDIOGRAM  COMPLETE Result Date: 10/29/2023    ECHOCARDIOGRAM REPORT   Patient Name:   Casey Brennan Date of Exam: 10/29/2023 Medical Rec #:  [id number]       Height:       69.0 in Accession #:    [id number]      Weight:       215.0 lb Date of Birth:  06-30-1961      BSA:          2.130 m Patient Age:    62 years        BP:           128/75 mmHg Patient Gender: M               HR:           61 bpm. Exam Location:  [address] Procedure: 2D Echo, Color Doppler, Cardiac Doppler, 3D Echo and Strain Analysis            (Both Spectral and Color Flow Doppler were utilized during            procedure). Indications:    I10 Hypertension; NSVT I47.29  History:        Patient has prior history of Echocardiogram examinations, most                 recent 03/03/2022. COPD and CKD; Risk Factors:Hypertension.  Sonographer:    Joleen Navy RDCS Referring Phys: [id number] [facility] PATWARDHAN IMPRESSIONS  1. Left ventricular ejection fraction, by estimation, is 60 to 65%. Left ventricular ejection fraction by 3D volume is 63 %. The left ventricle has normal function. The left ventricle has no regional wall motion abnormalities. There is mild left ventricular hypertrophy. Left ventricular diastolic parameters were normal. The average left ventricular global longitudinal strain is -20.1 %. The global longitudinal strain is normal.  2. Right ventricular systolic function is normal. The right ventricular size is normal. Tricuspid regurgitation signal is inadequate for assessing PA pressure.  3. The mitral valve is normal in structure. Trivial mitral valve regurgitation. No evidence of mitral stenosis.  4. The aortic valve is tricuspid. Aortic valve regurgitation is not visualized. No aortic stenosis is present.  5. The inferior vena cava is normal in size with greater than 50% respiratory variability, suggesting right atrial pressure of 3 mmHg. FINDINGS  Left Ventricle: Left ventricular ejection fraction, by estimation, is 60 to 65%. Left  ventricular ejection fraction by 3D volume is 63 %. The left ventricle has normal function. The left ventricle has no regional wall motion abnormalities. The average left ventricular global longitudinal strain is -20.1 %. Strain was performed and the global longitudinal strain is normal. The left ventricular internal cavity size was normal in size. There is mild left ventricular hypertrophy. Left ventricular diastolic parameters were normal. Right Ventricle: The right ventricular size is normal. No increase in right ventricular wall thickness.  Right ventricular systolic function is normal. Tricuspid regurgitation signal is inadequate for assessing PA pressure. Left Atrium: Left atrial size was normal in size. Right Atrium: Right atrial size was normal in size. Pericardium: There is no evidence of pericardial effusion. Mitral Valve: The mitral valve is normal in structure. Trivial mitral valve regurgitation. No evidence of mitral valve stenosis. Tricuspid Valve: The tricuspid valve is normal in structure. Tricuspid valve regurgitation is trivial. Aortic Valve: The aortic valve is tricuspid. Aortic valve regurgitation is not visualized. No aortic stenosis is present. Pulmonic Valve: The pulmonic valve was not well visualized. Pulmonic valve regurgitation is trivial. Aorta: The aortic root is normal in size and structure. Venous: The inferior vena cava is normal in size with greater than 50% respiratory variability, suggesting right atrial pressure of 3 mmHg. IAS/Shunts: The interatrial septum was not well visualized. Additional Comments: 3D was performed not requiring image post processing on an independent workstation and was normal.  LEFT VENTRICLE PLAX 2D LVIDd:         4.40 cm         Diastology LVIDs:         3.00 cm         LV e' medial:    6.85 cm/s LV PW:         1.10 cm         LV E/e' medial:  10.6 LV IVS:        1.10 cm         LV e' lateral:   13.10 cm/s LVOT diam:     2.40 cm         LV E/e' lateral: 5.5  LV SV:         66 LV SV Index:   31              2D Longitudinal LVOT Area:     4.52 cm        Strain                                2D Strain GLS   -20.1 %                                Avg:                                 3D Volume EF                                LV 3D EF:    Left                                             ventricul                                             ar  ejection                                             fraction                                             by 3D                                             volume is                                             63 %.                                 3D Volume EF:                                3D EF:        63 %                                LV EDV:       169 ml                                LV ESV:       62 ml                                LV SV:        107 ml RIGHT VENTRICLE             IVC RV Basal diam:  4.20 cm     IVC diam: 1.90 cm RV Mid diam:    3.50 cm RV S prime:     10.90 cm/s TAPSE (M-mode): 1.4 cm LEFT ATRIUM             Index        RIGHT ATRIUM           Index LA diam:        2.70 cm 1.27 cm/m   RA Area:     16.40 cm LA Vol (A2C):   75.0 ml 35.20 ml/m  RA Volume:   45.20 ml  21.22 ml/m LA Vol (A4C):   32.1 ml 15.07 ml/m LA Biplane Vol: 50.1 ml 23.52 ml/m  AORTIC VALVE LVOT Vmax:   64.00 cm/s LVOT Vmean:  41.900 cm/s LVOT VTI:    0.146 m  AORTA Ao Root diam: 3.70 cm Ao Asc diam:  3.80 cm MITRAL VALVE MV Area (PHT): 2.99 cm    SHUNTS MV Decel Time: 254 msec    Systemic VTI:  0.15 m MV E velocity: 72.30 cm/s  Systemic Diam: 2.40 cm MV A velocity: 83.30  cm/s MV E/A ratio:  0.87 Carson Clara MD Electronically signed by Carson Clara MD Signature Date/Time: 10/29/2023/4:20:40 PM    Final      Assessment & Plan:   1. Asthma with chronic obstructive pulmonary disease (COPD) (HCC) (Primary)   Assessment and Plan    Severe obstructive airway disease,  asthmatic type Severe obstructive airway disease with asthmatic features, characterized by daily wheezing, particularly worse at night and in the morning, persisting despite treatment with Breztri , Singulair , Zyrtec , and PRN albuterol . Previous pulmonary function test confirmed severe obstruction. Considering biologics such as Xolair or Dupixent to address persistent symptoms by decreasing inflammatory markers. He has had multiple courses of prednisone  and recent antibiotics for respiratory issues, indicating ongoing symptom burden. - Continue Breztri , Singulair , Zyrtec , and PRN albuterol  - Consult with Doctor Villa Greaser regarding the addition of biologics  Eosinophilic esophagitis Eosinophilic esophagitis potentially contributing to wheezing, managed with pantoprazole  and famotidine . Insurance issues have led to discontinuation of prucalopride, possibly contributing to abdominal symptoms. Following with Dr. Bridgett Camps, last seen in March - Continue pantoprazole  and famotidine  - Pending coverage for prucalopride  Gastroparesis Gastroparesis contributing to abdominal swelling, managed with pantoprazole  and famotidine , with prucalopride pending insurance approval. - Continue pantoprazole  and famotidine  - Started on Motegrity  (prucalopride) 2 mg daily in March to improve gastric motility and alleviate symptoms.   Complete heart block Complete heart block with follow-up care under Doctor Harvie Liner for electrophysiology. No direct correlation with current pulmonary symptoms identified. - Continue follow-up with Doctor Harvie Liner for electrophysiology  Allergies to multiple antibiotics - X Consider making appt with allergist Dr Idolina Maker for PCN testing & desensitization    Antonio Baumgarten, NP 11/28/2023

## 2023-11-28 NOTE — Patient Instructions (Addendum)
 -  SEVERE OBSTRUCTIVE AIRWAY DISEASE, ASTHMATIC TYPE: This condition involves severe narrowing of the airways with an asthmatic component, causing daily wheezing. We will continue your current medications (Breztri , Singulair , Zyrtec , and albuterol  as needed) and consult with Doctor Villa Greaser about adding biologics like Xolair or Dupixent to help reduce inflammation and improve your symptoms. We will also start the process for insurance approval for one of these biologics.  -EOSINOPHILIC GASTRITIS: Eosinophilic gastritis is an inflammation of the stomach lining caused by an increased number of eosinophils, a type of white blood cell. This condition may be contributing to your wheezing. We will continue your current medications (pantoprazole  and famotidine ) and work on addressing the insurance coverage for prucalopride.  -GASTROPARESIS: Gastroparesis is a condition that affects the stomach muscles and prevents proper stomach emptying, leading to abdominal swelling. We will continue your current medications (pantoprazole  and famotidine ) and address the insurance coverage for prucalopride.  -COMPLETE HEART BLOCK: Complete heart block is a condition where the electrical signals in the heart are partially or completely blocked. You will continue to follow up with Doctor Harvie Liner for electrophysiology care.  INSTRUCTIONS:  Please continue taking your current medications as prescribed: Breztri , Singulair , Zyrtec , albuterol  as needed, pantoprazole , and famotidine . We will consult with Doctor Villa Greaser about adding biologics and start the process for insurance approval. Additionally, we will work on addressing the insurance coverage for prucalopride. Continue your follow-up care with Doctor Harvie Liner for your heart condition.  Follow-up 3 months with either Dr. Villa Greaser or [facility]

## 2023-11-29 NOTE — Progress Notes (Unsigned)
  Electrophysiology Office Note:    Date:  11/30/2023   ID:  Casey Brennan, DOB Jan 05, 1961, MRN [id number]  [facility] Cardiologist:  Cody Das, MD  [facility] Electrophysiologist:  Boyce Byes, MD   Referring MD: Cody Das, MD   Chief Complaint: Abnormal heart monitor  History of Present Illness:    Casey Brennan is a 63 year old man who I am seeing today for an abnormal heart monitor at the request of Casey Brennan.  The patient has a history of hypertension, hyperlipidemia, HIV.  The patient also has a history of nonsustained ventricular tachycardia.  When he saw Casey Brennan in March 2025, a ZIO monitor and echo were ordered.  Previously, the patient had a reduced ejection fraction.  He is with his sister today in clinic.  He describes chest pain that occurs at random times.  Not clearly associated with exertion.  It is located in the left chest.  Pressure-like sensation.    Their past medical, social and family history was reviewed.   ROS:   Please see the history of present illness.    All other systems reviewed and are negative.  EKGs/Labs/Other Studies Reviewed:    The following studies were reviewed today:  Nov 12, 2023 cardiac MRI EF 55% No LGE RV normal  Nov 02, 2023 ZIO monitor Heart rate 47-1 35, average 82 1 episode of bradycardia lasting 11 seconds during the daytime hours.  Personal review of this tracing shows just one-to-one conduction with Wenckebach sequence.  No clear evidence of high degree AV block. No atrial fibrillation  October 01, 2023 EKG shows sinus rhythm.  Normal intervals.  No preexcitation.  No evidence of AV block.      Physical Exam:    VS:  BP (!) 140/78   Pulse 72   Ht 5\' 9"  (1.753 m)   Wt 241 lb (109.3 kg)   SpO2 93%   BMI 35.59 kg/m     Wt Readings from Last 3 Encounters:  11/30/23 241 lb (109.3 kg)  11/28/23 237 lb 6.4 oz (107.7 kg)  11/22/23 231 lb 12.8 oz (105.1 kg)     GEN: no  distress CARD: RRR, No MRG RESP: No IWOB. CTAB.        ASSESSMENT AND PLAN:    1. Tachycardia-bradycardia syndrome (HCC)   2. Essential hypertension    #Chest pain Unclear cause.  Could be musculoskeletal given his extensive history or obstructive coronary disease.  I have recommended a CTA coronary to further investigate.  #Abnormal heart monitor Patient with asymptomatic bradycardia during the 2 weeks he was wearing the ZIO monitor.  Review of rhythm strip suggests Wenckebach sequence.  No evidence of high degree AV block.  The patient is without history of syncope. For now, I would recommend continued conservative management/monitoring. Pacemaker is not currently indicated.  #Hypertension At goal today.  Recommend checking blood pressures 1-2 times per week at home and recording the values.  Recommend bringing these recordings to the primary care physician.  Follow-up with EP on an as needed basis.  Continue routine follow-up with general cardiology.   Signed, Leanora Prophet. Marven Slimmer, MD, [facility], [facility] 11/30/2023 12:47 PM    Electrophysiology [facility]

## 2023-11-30 ENCOUNTER — Ambulatory Visit: Attending: Cardiology | Admitting: Cardiology

## 2023-11-30 ENCOUNTER — Other Ambulatory Visit: Payer: Self-pay

## 2023-11-30 ENCOUNTER — Encounter: Payer: Self-pay | Admitting: Cardiology

## 2023-11-30 VITALS — BP 140/78 | HR 72 | Ht 69.0 in | Wt 241.0 lb

## 2023-11-30 DIAGNOSIS — I10 Essential (primary) hypertension: Secondary | ICD-10-CM

## 2023-11-30 DIAGNOSIS — I495 Sick sinus syndrome: Secondary | ICD-10-CM

## 2023-11-30 DIAGNOSIS — R072 Precordial pain: Secondary | ICD-10-CM

## 2023-11-30 NOTE — Patient Instructions (Addendum)
 Medication Instructions:  Your physician recommends that you continue on your current medications as directed. Please refer to the Current Medication list given to you today.  *If you need a refill on your cardiac medications before your next appointment, please call your pharmacy*  Lab Work: TODAY: BMET  Testing/Procedures: Coronary CTA Your physician has requested that you have cardiac CT. Cardiac computed tomography (CT) is a painless test that uses an x-ray machine to take clear, detailed pictures of your heart. For further information please visit [email or web address] Please follow instruction sheet as given.    Follow-Up: At [facility], you and your health needs are our priority.  As part of our continuing mission to provide you with exceptional heart care, our providers are all part of one team.  This team includes your primary Cardiologist (physician) and Advanced Practice Providers or APPs (Physician Assistants and Nurse Practitioners) who all work together to provide you with the care you need, when you need it.  Your next appointment:   As needed with Dr. Marven Slimmer

## 2023-12-01 LAB — BASIC METABOLIC PANEL WITH GFR
BUN/Creatinine Ratio: 9 — ABNORMAL LOW (ref 10–24)
BUN: 12 mg/dL (ref 8–27)
CO2: 24 mmol/L (ref 20–29)
Calcium: 9.5 mg/dL (ref 8.6–10.2)
Chloride: 102 mmol/L (ref 96–106)
Creatinine, Ser: 1.3 mg/dL — ABNORMAL HIGH (ref 0.76–1.27)
Glucose: 87 mg/dL (ref 70–99)
Potassium: 4 mmol/L (ref 3.5–5.2)
Sodium: 141 mmol/L (ref 134–144)
eGFR: 63 mL/min/{1.73_m2} (ref >59)

## 2023-12-03 MED ORDER — MOTEGRITY 2 MG PO TABS: 1.0000 | ORAL_TABLET | Freq: Every day | ORAL | 5 refills | Status: DC

## 2023-12-03 NOTE — Telephone Encounter (Signed)
 Advised April to move forward with Motegrity  as previously planned. No additional recommendations at this time.

## 2023-12-03 NOTE — Telephone Encounter (Signed)
 Spoke to patient's sister, April. She states that patient did not ever get Motegrity  as he was unaware he was supposed to be getting this until their call today and was never told it was available at the pharmacy. Review of chart shows that PA team asked for name brand Motegrity  to be sent as insurance requires, however, I cannot see that the note was sent anywhere.  I have sent name brand Motegrity  to patient's pharmacy and advised April to have patient start this medication. April says that patient has had quite a bit of bloating and food regurgitation lately. She also says he continues to experience dysphagia and some loss of appetite. April indicates that patient has been taking pantoprazole  40 mg twice daily and famotidine  40 mg at bedtime. Regarding the swelling, April says the swelling is strictly in the abdomen and there is no other extremity edema etc.   I advised that the motegrity  should help bloating once we are able to get patient's bowels moving better and may also help the regurgitation since it is a prokinetic agent that speeds up movement of food.   Dr Bridgett Camps, do you have any additional recommendations/thoughts?

## 2023-12-03 NOTE — Telephone Encounter (Signed)
 No additional thoughts Hopefully the motegrity  will help with global intestinal dysmotility as well as chronic constipation

## 2023-12-03 NOTE — Telephone Encounter (Signed)
 PT calling with sister April to discuss status of Prucalopride. PT sister wants to know if Motegrity  prescription can be sent to [facility] on [address]. PT also stated he is still very bloated and that food is not regurgitating in his mouth. Requesting to speak with nurse about concerns. Call April at [phone] per patient's request

## 2023-12-03 NOTE — Addendum Note (Signed)
 Addended by: Glennette Lanius on: 12/03/2023 01:06 PM   Modules accepted: Orders

## 2023-12-05 DIAGNOSIS — M545 Low back pain, unspecified: Secondary | ICD-10-CM | POA: Diagnosis not present

## 2023-12-06 ENCOUNTER — Telehealth (HOSPITAL_COMMUNITY): Payer: Self-pay | Admitting: *Deleted

## 2023-12-06 ENCOUNTER — Encounter (HOSPITAL_COMMUNITY): Payer: Self-pay

## 2023-12-06 NOTE — Telephone Encounter (Signed)
 Reaching out to patient to offer assistance regarding upcoming cardiac imaging study; pt's sister answered phone and verbalizes understanding of appt date/time, parking situation and where to check in, pre-test NPO status, and verified current allergies; name and call back number provided for further questions should they arise  Chase Copping RN Navigator Cardiac Imaging [facility] [phone] office [phone] cell

## 2023-12-07 ENCOUNTER — Ambulatory Visit (HOSPITAL_COMMUNITY): Admission: RE | Admit: 2023-12-07 | Source: Ambulatory Visit

## 2023-12-07 ENCOUNTER — Other Ambulatory Visit: Payer: Self-pay | Admitting: Cardiology

## 2023-12-07 DIAGNOSIS — G250 Essential tremor: Secondary | ICD-10-CM

## 2023-12-07 DIAGNOSIS — R252 Cramp and spasm: Secondary | ICD-10-CM

## 2023-12-07 DIAGNOSIS — B20 Human immunodeficiency virus [HIV] disease: Secondary | ICD-10-CM

## 2023-12-07 DIAGNOSIS — G44229 Chronic tension-type headache, not intractable: Secondary | ICD-10-CM

## 2023-12-12 ENCOUNTER — Ambulatory Visit: Admitting: Cardiology

## 2023-12-17 ENCOUNTER — Encounter (HOSPITAL_COMMUNITY): Payer: Self-pay

## 2023-12-17 ENCOUNTER — Other Ambulatory Visit (HOSPITAL_COMMUNITY)

## 2023-12-17 ENCOUNTER — Telehealth (HOSPITAL_COMMUNITY): Payer: Self-pay | Admitting: Emergency Medicine

## 2023-12-17 NOTE — Telephone Encounter (Signed)
 Called and spoke to patients sister who reports a new appt was made for 6/24 at 3:30p. Will call again closer to appt.  Jinger Mount RN Navigator Cardiac Imaging [facility] [phone] Office  [phone] Cell

## 2023-12-18 ENCOUNTER — Ambulatory Visit (HOSPITAL_COMMUNITY)

## 2023-12-25 ENCOUNTER — Ambulatory Visit (HOSPITAL_COMMUNITY)
Admission: RE | Admit: 2023-12-25 | Discharge: 2023-12-25 | Disposition: A | Discharge status: Home / Self Care | Source: Ambulatory Visit | Attending: Cardiology | Admitting: Cardiology

## 2023-12-25 DIAGNOSIS — R072 Precordial pain: Secondary | ICD-10-CM | POA: Diagnosis not present

## 2023-12-25 MED ORDER — IOHEXOL 350 MG/ML SOLN
100.0000 mL | Freq: Once | INTRAVENOUS | Status: AC | PRN
  Administered 2023-12-25: 100 mL via INTRAVENOUS

## 2023-12-25 MED ORDER — NITROGLYCERIN 0.4 MG SL SUBL: 0.8000 mg | SUBLINGUAL_TABLET | Freq: Once | SUBLINGUAL | Status: DC

## 2023-12-25 MED ORDER — NITROGLYCERIN 0.4 MG SL SUBL
SUBLINGUAL_TABLET | SUBLINGUAL | Status: AC
  Filled 2023-12-25: qty 2

## 2023-12-26 ENCOUNTER — Ambulatory Visit: Payer: Self-pay | Admitting: Cardiology

## 2023-12-26 DIAGNOSIS — H40022 Open angle with borderline findings, high risk, left eye: Secondary | ICD-10-CM | POA: Diagnosis not present

## 2023-12-26 DIAGNOSIS — H401114 Primary open-angle glaucoma, right eye, indeterminate stage: Secondary | ICD-10-CM | POA: Diagnosis not present

## 2023-12-31 ENCOUNTER — Other Ambulatory Visit (HOSPITAL_COMMUNITY): Payer: Self-pay

## 2023-12-31 ENCOUNTER — Other Ambulatory Visit: Payer: Self-pay | Admitting: *Deleted

## 2023-12-31 ENCOUNTER — Other Ambulatory Visit: Payer: Self-pay

## 2023-12-31 DIAGNOSIS — R109 Unspecified abdominal pain: Secondary | ICD-10-CM | POA: Diagnosis not present

## 2023-12-31 MED ORDER — CABENUVA 600 & 900 MG/3ML IM SUER
INTRAMUSCULAR | 3 refills | Status: DC
  Filled 2023-12-31 – 2024-01-01 (×2): qty 6, 60d supply, fill #0
  Filled 2024-02-15: qty 6, 60d supply, fill #1
  Filled 2024-05-12: qty 6, 60d supply, fill #2

## 2024-01-01 ENCOUNTER — Other Ambulatory Visit: Payer: Self-pay

## 2024-01-01 ENCOUNTER — Telehealth (HOSPITAL_BASED_OUTPATIENT_CLINIC_OR_DEPARTMENT_OTHER): Payer: Self-pay

## 2024-01-01 ENCOUNTER — Other Ambulatory Visit (HOSPITAL_COMMUNITY): Payer: Self-pay

## 2024-01-01 DIAGNOSIS — I129 Hypertensive chronic kidney disease with stage 1 through stage 4 chronic kidney disease, or unspecified chronic kidney disease: Secondary | ICD-10-CM | POA: Diagnosis not present

## 2024-01-01 DIAGNOSIS — N2581 Secondary hyperparathyroidism of renal origin: Secondary | ICD-10-CM | POA: Diagnosis not present

## 2024-01-01 DIAGNOSIS — N182 Chronic kidney disease, stage 2 (mild): Secondary | ICD-10-CM | POA: Diagnosis not present

## 2024-01-01 DIAGNOSIS — D631 Anemia in chronic kidney disease: Secondary | ICD-10-CM | POA: Diagnosis not present

## 2024-01-01 NOTE — Telephone Encounter (Signed)
 Copied from CRM [id number]. Topic: Clinical - Medication Question >> Jan 01, 2024  3:22 PM Isabell A wrote: Reason for CRM: Patient sister April states Dr.Alva was supposed to prescribe a medication for his wheezing.   Callback number: [phone]

## 2024-01-01 NOTE — Progress Notes (Signed)
 Sending to [facility] [address]. Office has Moved.

## 2024-01-01 NOTE — Progress Notes (Signed)
 Specialty Pharmacy Refill Coordination Note  SALAAM BATTERSHELL is a 63 y.o. male assessed today regarding refills of clinic administered specialty medication(s) Cabotegravir  & Rilpivirine  (CABENUVA )   Clinic requested Courier to Provider Office   Delivery date: 01/02/24   Verified address: [facility] [place],  [place] [address]   Medication will be filled on 07.01.25.

## 2024-01-02 ENCOUNTER — Other Ambulatory Visit (HOSPITAL_BASED_OUTPATIENT_CLINIC_OR_DEPARTMENT_OTHER): Payer: Self-pay

## 2024-01-02 MED ORDER — QVAR REDIHALER 40 MCG/ACT IN AERB: 2.0000 | INHALATION_SPRAY | Freq: Two times a day (BID) | RESPIRATORY_TRACT | 5 refills | Status: AC

## 2024-01-02 NOTE — Telephone Encounter (Signed)
 If he is having wheezing symptoms we can add a second steroid inhaler called Qvar  40mcg 2 puffs twice daily in addition to Breztri 

## 2024-01-02 NOTE — Telephone Encounter (Signed)
 I left a detailed message on April phone (DPR) to let her now I did tell her to send us  a my chart message to let us  know if Qvar  is something they wish to start I will also send Dr Jude and Cleotha note to mychart so will be easier for pt to reply

## 2024-01-03 ENCOUNTER — Ambulatory Visit: Admitting: *Deleted

## 2024-01-03 ENCOUNTER — Ambulatory Visit

## 2024-01-03 DIAGNOSIS — Z21 Asymptomatic human immunodeficiency virus [HIV] infection status: Secondary | ICD-10-CM | POA: Diagnosis not present

## 2024-01-03 MED ORDER — CABOTEGRAVIR & RILPIVIRINE ER 600 & 900 MG/3ML IM SUER
1.0000 | Freq: Once | INTRAMUSCULAR | Status: AC
  Administered 2024-01-03: 1 via INTRAMUSCULAR

## 2024-01-03 NOTE — Progress Notes (Signed)
    Patient presents for Cabenuva  injections   States feeling well, yes.   Target date is 9th of each month   Last injection received 11/12/23.   Patient is within 7 day window before or after target date for injections   Unable to use VG sites 2/2 body habitus   Rilpivirine  900 mg Given IM RUOQ. Patient tolerated well.   Cabotegravir  600 given IM LUOQ. Patient tolerated well.   Patient waited 10 minutes in clinic following injections without incident.  Next injections are due 03/11/24.   Brailen Macneal,RN

## 2024-01-07 DIAGNOSIS — S39012D Strain of muscle, fascia and tendon of lower back, subsequent encounter: Secondary | ICD-10-CM | POA: Diagnosis not present

## 2024-01-07 DIAGNOSIS — M6281 Muscle weakness (generalized): Secondary | ICD-10-CM | POA: Diagnosis not present

## 2024-01-08 ENCOUNTER — Other Ambulatory Visit (HOSPITAL_COMMUNITY): Payer: Self-pay

## 2024-01-09 ENCOUNTER — Other Ambulatory Visit: Payer: Self-pay | Admitting: Internal Medicine

## 2024-01-09 DIAGNOSIS — I671 Cerebral aneurysm, nonruptured: Secondary | ICD-10-CM | POA: Diagnosis not present

## 2024-01-09 DIAGNOSIS — G894 Chronic pain syndrome: Secondary | ICD-10-CM | POA: Diagnosis not present

## 2024-01-09 DIAGNOSIS — Z981 Arthrodesis status: Secondary | ICD-10-CM | POA: Diagnosis not present

## 2024-01-09 DIAGNOSIS — M47812 Spondylosis without myelopathy or radiculopathy, cervical region: Secondary | ICD-10-CM | POA: Diagnosis not present

## 2024-01-09 DIAGNOSIS — M5416 Radiculopathy, lumbar region: Secondary | ICD-10-CM | POA: Diagnosis not present

## 2024-01-16 ENCOUNTER — Encounter: Payer: Self-pay | Admitting: Internal Medicine

## 2024-01-16 ENCOUNTER — Ambulatory Visit: Admitting: Internal Medicine

## 2024-01-16 VITALS — BP 112/62 | HR 85 | Ht 69.0 in | Wt 234.0 lb

## 2024-01-16 DIAGNOSIS — K200 Eosinophilic esophagitis: Secondary | ICD-10-CM

## 2024-01-16 DIAGNOSIS — T402X5A Adverse effect of other opioids, initial encounter: Secondary | ICD-10-CM

## 2024-01-16 DIAGNOSIS — K5903 Drug induced constipation: Secondary | ICD-10-CM | POA: Diagnosis not present

## 2024-01-16 DIAGNOSIS — Z860101 Personal history of adenomatous and serrated colon polyps: Secondary | ICD-10-CM | POA: Diagnosis not present

## 2024-01-16 DIAGNOSIS — K3184 Gastroparesis: Secondary | ICD-10-CM

## 2024-01-16 DIAGNOSIS — K2100 Gastro-esophageal reflux disease with esophagitis, without bleeding: Secondary | ICD-10-CM

## 2024-01-16 DIAGNOSIS — K5281 Eosinophilic gastritis or gastroenteritis: Secondary | ICD-10-CM | POA: Diagnosis not present

## 2024-01-16 DIAGNOSIS — Z860100 Personal history of colon polyps, unspecified: Secondary | ICD-10-CM

## 2024-01-16 DIAGNOSIS — B20 Human immunodeficiency virus [HIV] disease: Secondary | ICD-10-CM

## 2024-01-16 DIAGNOSIS — K222 Esophageal obstruction: Secondary | ICD-10-CM

## 2024-01-16 MED ORDER — NALOXEGOL OXALATE 25 MG PO TABS: 25.0000 mg | ORAL_TABLET | Freq: Every day | ORAL | 1 refills | Status: DC

## 2024-01-16 NOTE — Progress Notes (Signed)
   Subjective:    Patient ID: Casey Brennan, male    DOB: [birth year], 63 y.o.   MRN: [id number]  HPI Casey Brennan is a 63 year old male with a history of longstanding HIV disease on antiviral therapy, history of esophageal dysphagia with previous dilation, history of eosinophilic esophagitis and gastritis, history of gastric ulcer, gastroparesis, intermittent constipation who is here for follow-up.  He is here today with his sister.  He also has a history of COPD, prostate cancer status post prostatectomy, cervical disc disease status post intervention x 2 and arthritis.  He was last seen on 09/19/2023.  He reports that he only hold his doing the best I can.  He did start Motegrity  to try to help with his gastroparesis after Reglan  caused tremor.  This also has helped some with constipation.  He still is having intermittent trouble with solid food dysphagia.  No nausea some minor full feeling with eating but no vomiting.  Still having intermittent difficulty with constipation using Dulcolax as needed.  He is taking hydrocodone  for his neck pain about every other night.  He was having abdominal bloating earlier in March and we did an ultrasound of the abdomen which was negative for ascites.  There was negative ultrasound appearance of the abdomen status post cholecystectomy.  Review of Systems As per HPI, otherwise negative  Current Medications, Allergies, Past Medical History, Past Surgical History, Family History and Social History were reviewed in [employer] record.    Objective:   Physical Exam BP 112/62   Pulse 85   Ht 5' 9 (1.753 m)   Wt 234 lb (106.1 kg)   BMI 34.56 kg/m  Gen: awake, alert, NAD HEENT: anicteric  Abd: soft, NT/ND, +BS throughout Ext: no c/c/e Neuro: nonfocal  EGD 05/25/2023 2 benign-appearing moderate stenoses were found in the distal esophagus dilation to 20 mm showed mild mucosal disruption.  A large amount of food in the gastric body  and antrum consistent with gastroparesis and healed esophageal gastric ulcer  Colonoscopy 01/18/2023 2 mm transverse polyp removed, diverticulosis in the left colon scar in the rectum and small hemorrhoids.  The pathology on the polyp was a tubular adenoma x 1.  Repeat colonoscopy was recommended in 7 years     Assessment & Plan:   63 year old male with a history of longstanding HIV disease on antiviral therapy, history of esophageal dysphagia with previous dilation, history of eosinophilic esophagitis and gastritis, history of gastric ulcer, gastroparesis, intermittent constipation who is here for follow-up.    Esophageal dysphagia and history of esophageal stricture/history of eosinophilic esophagitis though not seen at recent EGD in November --Continue pantoprazole  40 mg daily --Repeat EGD for dilation; we discussed the risk, benefits and alternatives and he is agreeable and wishes to proceed  Gastroparesis Intolerant to Reglan  due to tremor.  Motegrity  2 mg seems to be somewhat effective. 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  If this does recur given overlap with eosinophilic asthma we could consider Dupixent.  History of small adenomatous colon polyp Surveillance colonoscopy recommended in 2031  HIV Following with ID and on therapy

## 2024-01-16 NOTE — Patient Instructions (Addendum)
 Continue Motegrity  daily and pantoprazole  daily.   We have sent the following medications to your pharmacy for you to pick up at your convenience: Movantik  25mg  daily.   You have been scheduled for an endoscopy. Please follow written instructions given to you at your visit today.  If you use inhalers (even only as needed), please bring them with you on the day of your procedure.  If you take any of the following medications, they will need to be adjusted prior to your procedure:   DO NOT TAKE 7 DAYS PRIOR TO TEST- Trulicity (dulaglutide) Ozempic, Wegovy (semaglutide) Mounjaro (tirzepatide) Bydureon Bcise (exanatide extended release)  DO NOT TAKE 1 DAY PRIOR TO YOUR TEST Rybelsus (semaglutide) Adlyxin (lixisenatide) Victoza (liraglutide) Byetta (exanatide) ___________________________________________________________________________  _______________________________________________________  If your blood pressure at your visit was 140/90 or greater, please contact your primary care physician to follow up on this.  _______________________________________________________  If you are age 1 or older, your body mass index should be between 23-30. Your Body mass index is 34.56 kg/m. If this is out of the aforementioned range listed, please consider follow up with your Primary Care Provider.  If you are age 51 or younger, your body mass index should be between 19-25. Your Body mass index is 34.56 kg/m. If this is out of the aformentioned range listed, please consider follow up with your Primary Care Provider.   ________________________________________________________  The [name] providers would like to encourage you to use MYCHART to communicate with providers for non-urgent requests or questions.  Due to long hold times on the telephone, sending your provider a message by [facility] may be a faster and more efficient way to get a response.  Please allow 48 business hours for a response.   Please remember that this is for non-urgent requests.  _______________________________________________________

## 2024-01-21 ENCOUNTER — Ambulatory Visit (AMBULATORY_SURGERY_CENTER): Admitting: Internal Medicine

## 2024-01-21 ENCOUNTER — Encounter: Payer: Self-pay | Admitting: Internal Medicine

## 2024-01-21 ENCOUNTER — Other Ambulatory Visit: Payer: Self-pay | Admitting: Internal Medicine

## 2024-01-21 VITALS — BP 134/89 | HR 72 | Temp 97.4°F | Resp 15 | Ht 69.0 in | Wt 234.0 lb

## 2024-01-21 DIAGNOSIS — K3189 Other diseases of stomach and duodenum: Secondary | ICD-10-CM | POA: Diagnosis not present

## 2024-01-21 DIAGNOSIS — K3184 Gastroparesis: Secondary | ICD-10-CM

## 2024-01-21 DIAGNOSIS — K2950 Unspecified chronic gastritis without bleeding: Secondary | ICD-10-CM | POA: Diagnosis not present

## 2024-01-21 DIAGNOSIS — K5281 Eosinophilic gastritis or gastroenteritis: Secondary | ICD-10-CM

## 2024-01-21 DIAGNOSIS — J449 Chronic obstructive pulmonary disease, unspecified: Secondary | ICD-10-CM | POA: Diagnosis not present

## 2024-01-21 DIAGNOSIS — K2100 Gastro-esophageal reflux disease with esophagitis, without bleeding: Secondary | ICD-10-CM

## 2024-01-21 DIAGNOSIS — K222 Esophageal obstruction: Secondary | ICD-10-CM | POA: Diagnosis not present

## 2024-01-21 DIAGNOSIS — I10 Essential (primary) hypertension: Secondary | ICD-10-CM | POA: Diagnosis not present

## 2024-01-21 DIAGNOSIS — K200 Eosinophilic esophagitis: Secondary | ICD-10-CM | POA: Diagnosis not present

## 2024-01-21 DIAGNOSIS — K229 Disease of esophagus, unspecified: Secondary | ICD-10-CM | POA: Diagnosis not present

## 2024-01-21 MED ORDER — SODIUM CHLORIDE 0.9 % IV SOLN: 500.0000 mL | Freq: Once | INTRAVENOUS | Status: DC

## 2024-01-21 NOTE — Progress Notes (Signed)
 Updated medical record, pt states he had 3-4 ritz crackers around 8-830 this AM. Also states he did take nitroglycerin  in the past week for chest pain that was resolved, no other sx or complaints. Denies any other chest pain or cardiac sx since. Report to [facility] Monday

## 2024-01-21 NOTE — Progress Notes (Signed)
 See office note dated 01/16/2024 for details and current H&P  Patient presenting for upper endoscopy to evaluate esophageal dysphagia history of eosinophilic esophagitis and gastroenteritis and for probable dilation  He remains appropriate for upper endoscopy in the [facility] today

## 2024-01-21 NOTE — Progress Notes (Signed)
 Called to room to assist during endoscopic procedure.  Patient ID and intended procedure confirmed with present staff. Received instructions for my participation in the procedure from the performing physician.

## 2024-01-21 NOTE — Progress Notes (Signed)
 Report to PACU, RN, vss, BBS= Clear.

## 2024-01-21 NOTE — Op Note (Signed)
 [facility] Patient Name: Casey Brennan Procedure Date: 01/21/2024 1:47 PM MRN: [id number] Endoscopist: Gordy CHRISTELLA Starch , MD, [id number] Age: 63 Referring MD:  Date of Birth: 1961/06/03 Gender: Male Account #: [id number] Procedure:                Upper GI endoscopy Indications:              Dysphagia, Gastro-esophageal reflux disease;                            History of previous eosinophilic inflammation in                            the esophagus and stomach; dilation to 20 mm in the                            distal esophagus in November 2024 with positive                            response Medicines:                Monitored Anesthesia Care Procedure:                Pre-Anesthesia Assessment:                           - Prior to the procedure, a History and Physical                            was performed, and patient medications and                            allergies were reviewed. The patient's tolerance of                            previous anesthesia was also reviewed. The risks                            and benefits of the procedure and the sedation                            options and risks were discussed with the patient.                            All questions were answered, and informed consent                            was obtained. Prior Anticoagulants: The patient has                            taken no anticoagulant or antiplatelet agents. ASA                            Grade Assessment: III - A patient with severe  systemic disease. After reviewing the risks and                            benefits, the patient was deemed in satisfactory                            condition to undergo the procedure.                           After obtaining informed consent, the endoscope was                            passed under direct vision. Throughout the                            procedure, the patient's blood pressure, pulse, and                             oxygen  saturations were monitored continuously. The                            Endoscope was introduced through the mouth, and                            advanced to the second part of duodenum. The upper                            GI endoscopy was accomplished without difficulty.                            The patient tolerated the procedure well. Scope In: Scope Out: Findings:                 Two benign-appearing, intrinsic mild                            (non-circumferential scarring) stenoses were found                            40 to 41 cm from the incisors. The narrowest                            stenosis measured 1.6 cm (inner diameter) x 1 cm                            (in length). The stenoses were traversed. A TTS                            dilator was passed through the scope. Dilation with                            an 18-19-20 mm balloon dilator was performed to 20  mm. The dilation site was examined and showed no                            change.                           Normal mucosa was found in the entire esophagus.                            Biopsies were taken with a cold forceps for                            histology.                           Localized nodular mucosa was found in the gastric                            antrum. Biopsies were taken with a cold forceps for                            histology.                           The exam of the stomach was otherwise normal.                           Biopsies were taken with a cold forceps in the                            gastric body, at the incisura and in the gastric                            antrum for Helicobacter pylori testing.                           The examined duodenum was normal. Complications:            No immediate complications. Estimated Blood Loss:     Estimated blood loss was minimal. Impression:               - Benign-appearing  esophageal stenoses. Dilated to                            20 mm with balloon.                           - Normal mucosa was found in the entire esophagus.                            Biopsied to exclude eosinophilic inflammation (as                            seen in the past).                           -  Nodular mucosa in the gastric antrum. Biopsied.                           - Normal examined duodenum.                           - Biopsies were taken with a cold forceps for                            Helicobacter pylori testing. Recommendation:           - Patient has a contact number available for                            emergencies. The signs and symptoms of potential                            delayed complications were discussed with the                            patient. Return to normal activities tomorrow.                            Written discharge instructions were provided to the                            patient.                           - Resume previous diet.                           - Continue present medications.                           - Await pathology results. Gordy CHRISTELLA Starch, MD 01/21/2024 2:14:37 PM This report has been signed electronically.

## 2024-01-21 NOTE — Patient Instructions (Addendum)
  Resume previous diet  Continue present medications  Awaiting pathology results   YOU HAD AN ENDOSCOPIC PROCEDURE TODAY AT THE [facility]:   Refer to the procedure report that was given to you for any specific questions about what was found during the examination.  If the procedure report does not answer your questions, please call your gastroenterologist to clarify.  If you requested that your care partner not be given the details of your procedure findings, then the procedure report has been included in a sealed envelope for you to review at your convenience later.  YOU SHOULD EXPECT: Some feelings of bloating in the abdomen. Passage of more gas than usual.  Walking can help get rid of the air that was put into your GI tract during the procedure and reduce the bloating. If you had a lower endoscopy (such as a colonoscopy or flexible sigmoidoscopy) you may notice spotting of blood in your stool or on the toilet paper. If you underwent a bowel prep for your procedure, you may not have a normal bowel movement for a few days.  Please Note:  You might notice some irritation and congestion in your nose or some drainage.  This is from the oxygen used during your procedure.  There is no need for concern and it should clear up in a day or so.  SYMPTOMS TO REPORT IMMEDIATELY:  Following upper endoscopy (EGD)  Vomiting of blood or coffee ground material  New chest pain or pain under the shoulder blades  Painful or persistently difficult swallowing  New shortness of breath  Fever of 100F or higher  Black, tarry-looking stools  For urgent or emergent issues, a gastroenterologist can be reached at any hour by calling [phone]. Do not use MyChart messaging for urgent concerns.    DIET:  We do recommend a small meal at first, but then you may proceed to your regular diet.  Drink plenty of fluids but you should avoid alcoholic beverages for 24 hours.  ACTIVITY:  You should plan to  take it easy for the rest of today and you should NOT DRIVE or use heavy machinery until tomorrow (because of the sedation medicines used during the test).    FOLLOW UP: Our staff will call the number listed on your records the next business day following your procedure.  We will call around 7:15- 8:00 am to check on you and address any questions or concerns that you may have regarding the information given to you following your procedure. If we do not reach you, we will leave a message.     If any biopsies were taken you will be contacted by phone or by letter within the next 1-3 weeks.  Please call us at [phone] if you have not heard about the biopsies in 3 weeks.    SIGNATURES/CONFIDENTIALITY: You and/or your care partner have signed paperwork which will be entered into your electronic medical record.  These signatures attest to the fact that that the information above on your After Visit Summary has been reviewed and is understood.  Full responsibility of the confidentiality of this discharge information lies with you and/or your care-partner.

## 2024-01-22 ENCOUNTER — Telehealth: Payer: Self-pay

## 2024-01-22 NOTE — Telephone Encounter (Signed)
  Follow up Call-     01/21/2024    1:06 PM 05/25/2023    9:32 AM 01/18/2023    2:44 PM 11/08/2022    9:23 AM  Call back number  Post procedure Call Back phone  # [phone]-April-sister [phone]--sister April [phone] (sister) [phone]  Permission to leave phone message Yes Yes Yes Yes     Patient questions:  Do you have a fever, pain , or abdominal swelling? No. Pain Score  0 *  Have you tolerated food without any problems? Yes.    Have you been able to return to your normal activities? Yes.    Do you have any questions about your discharge instructions: Diet   No. Medications  No. Follow up visit  No.  Do you have questions or concerns about your Care? No.  Actions: * If pain score is 4 or above: No action needed, pain <4.

## 2024-01-23 ENCOUNTER — Encounter: Payer: Self-pay | Admitting: Internal Medicine

## 2024-01-24 LAB — SURGICAL PATHOLOGY

## 2024-01-25 ENCOUNTER — Ambulatory Visit: Payer: Self-pay | Admitting: Internal Medicine

## 2024-01-25 MED ORDER — FAMOTIDINE 40 MG PO TABS: 40.0000 mg | ORAL_TABLET | Freq: Every day | ORAL | 1 refills | Status: DC

## 2024-01-25 NOTE — Telephone Encounter (Signed)
Yes can refill 

## 2024-01-28 ENCOUNTER — Ambulatory Visit: Attending: Cardiology | Admitting: Cardiology

## 2024-02-04 ENCOUNTER — Other Ambulatory Visit: Payer: Self-pay

## 2024-02-04 ENCOUNTER — Encounter: Payer: Self-pay | Admitting: Internal Medicine

## 2024-02-04 MED ORDER — MOTEGRITY 2 MG PO TABS: 1.0000 | ORAL_TABLET | Freq: Every day | ORAL | 1 refills | Status: AC

## 2024-02-04 MED ORDER — NALOXEGOL OXALATE 25 MG PO TABS: 25.0000 mg | ORAL_TABLET | Freq: Every day | ORAL | 1 refills | Status: DC

## 2024-02-11 DIAGNOSIS — J3089 Other allergic rhinitis: Secondary | ICD-10-CM | POA: Diagnosis not present

## 2024-02-11 DIAGNOSIS — J4530 Mild persistent asthma, uncomplicated: Secondary | ICD-10-CM | POA: Diagnosis not present

## 2024-02-11 DIAGNOSIS — J301 Allergic rhinitis due to pollen: Secondary | ICD-10-CM | POA: Diagnosis not present

## 2024-02-11 DIAGNOSIS — J449 Chronic obstructive pulmonary disease, unspecified: Secondary | ICD-10-CM | POA: Diagnosis not present

## 2024-02-15 ENCOUNTER — Other Ambulatory Visit: Payer: Self-pay

## 2024-02-15 NOTE — Progress Notes (Signed)
 Specialty Pharmacy Refill Coordination Note  JAMOL GINYARD is a 63 y.o. male assessed today regarding refills of clinic administered specialty medication(s) Cabotegravir & Rilpivirine (Cabenuva)   Clinic requested Courier to Provider Office   Delivery date: 02/28/24  Injection date: 03/06/24  Verified address: [facility] [place],  [place] [address]   Medication will be filled on 02/27/24.

## 2024-02-18 DIAGNOSIS — N281 Cyst of kidney, acquired: Secondary | ICD-10-CM | POA: Diagnosis not present

## 2024-02-22 ENCOUNTER — Ambulatory Visit: Admitting: Cardiology

## 2024-02-27 ENCOUNTER — Other Ambulatory Visit: Payer: Self-pay

## 2024-02-28 ENCOUNTER — Ambulatory Visit: Admitting: Cardiology

## 2024-03-06 ENCOUNTER — Ambulatory Visit: Admitting: *Deleted

## 2024-03-06 ENCOUNTER — Ambulatory Visit (HOSPITAL_BASED_OUTPATIENT_CLINIC_OR_DEPARTMENT_OTHER): Admitting: Pulmonary Disease

## 2024-03-06 DIAGNOSIS — Z21 Asymptomatic human immunodeficiency virus [HIV] infection status: Secondary | ICD-10-CM | POA: Diagnosis not present

## 2024-03-06 MED ORDER — CABOTEGRAVIR & RILPIVIRINE ER 600 & 900 MG/3ML IM SUER
1.0000 | Freq: Once | INTRAMUSCULAR | Status: AC
  Administered 2024-03-06: 1 via INTRAMUSCULAR

## 2024-03-06 NOTE — Progress Notes (Signed)
    Patient presents for Cabenuva  injections   States feeling well - yes.   Target date is 9th of each month   Last injection received  01/03/24.   Patient is within 7 day window before or after target date for injections   Unable to use VG sites 2/2 body habitus   Rilpivirine  900 mg Given IM RUOQ. Patient tolerated well.   Cabotegravir  600 given IM LUOQ. Patient tolerated well.   Patient waited 10 minutes in clinic following injections without incident.  Next injections are due 05/11/24.   Erland Vivas,RN

## 2024-03-10 ENCOUNTER — Telehealth: Payer: Self-pay | Admitting: *Deleted

## 2024-03-10 NOTE — Telephone Encounter (Signed)
 Return call from pt's sister,April. I told her I wanted to talk to Casey Brennan to find out how he is feeling per Dr Eben. She gave me his telephone# [phone]- no answer, left message.

## 2024-03-10 NOTE — Telephone Encounter (Signed)
 Pt called / informed if he continue to feel unwell, he needs to be seen; stated he will call Dr Nicola office , his PCP.

## 2024-03-10 NOTE — Telephone Encounter (Signed)
Called pt again - no answer; left message to call the office. 

## 2024-03-10 NOTE — Telephone Encounter (Signed)
 Received a call from pt's sister who stated pt c/o nausea, dizziness and diarrhea the last 2 times he received the Cabenuva  injection. Last injection was Thursday last week - she stated diarrhea started yesterday.  I asked if pt has taken medication; she stated he has Zofran  PRN, he took it this am. I asked about OTC Imodium; stated she will have pt start today for the diarrhea. Pt did not mention having any problems at his Cabenuva  appts. Sending to Dr Eben for advise.  Also appt has been schedule w/Dr Hatcher's first available appt - 9/30 @ 1445PM.

## 2024-03-10 NOTE — Telephone Encounter (Signed)
 Return call from pt. Stated he took Zofran  which seems to help for awhile and may be time to take another one. Stated dizziness only occurred yesterday after visiting his sister. And he has been lying down most of the day. No diarrhea today(he did not take medication); has not eaten anything except drinking water .  Also stated his right side is slightly swollen, the area he received his injection. Stated he does not think he has the same thing as his sister; stated his symptoms are different.

## 2024-03-10 NOTE — Telephone Encounter (Signed)
 Called pt - no answer; left message on vm to call the office to let us  know how he's feeling per Dr Eben.

## 2024-03-13 ENCOUNTER — Ambulatory Visit (INDEPENDENT_AMBULATORY_CARE_PROVIDER_SITE_OTHER): Admitting: Internal Medicine

## 2024-03-13 ENCOUNTER — Encounter: Payer: Self-pay | Admitting: Internal Medicine

## 2024-03-13 VITALS — BP 154/98 | HR 87 | Temp 98.1°F | Ht 69.0 in | Wt 236.8 lb

## 2024-03-13 DIAGNOSIS — I10 Essential (primary) hypertension: Secondary | ICD-10-CM | POA: Diagnosis not present

## 2024-03-13 DIAGNOSIS — Z21 Asymptomatic human immunodeficiency virus [HIV] infection status: Secondary | ICD-10-CM

## 2024-03-13 DIAGNOSIS — R1084 Generalized abdominal pain: Secondary | ICD-10-CM | POA: Diagnosis not present

## 2024-03-13 DIAGNOSIS — J069 Acute upper respiratory infection, unspecified: Secondary | ICD-10-CM | POA: Diagnosis not present

## 2024-03-13 MED ORDER — ONDANSETRON 4 MG PO TBDP: 4.0000 mg | ORAL_TABLET | Freq: Three times a day (TID) | ORAL | 2 refills | Status: AC | PRN

## 2024-03-13 MED ORDER — AZITHROMYCIN 250 MG PO TABS: ORAL_TABLET | ORAL | 1 refills | Status: AC

## 2024-03-13 NOTE — Progress Notes (Signed)
 Patient ID: Casey Brennan, male   DOB: 1961/02/26, 63 y.o.   MRN: [id number]        Chief Complaint: follow up GI symptoms       HPI:  Casey Brennan is a 63 y.o. male here with c/o fatigue and crampy pains, nausea and diarrhea the day after tx with IV Cabenuva  first dose   Called ID but asked to come here.   Pt denies chest pain, increased sob or doe, wheezing, orthopnea, PND, increased LE swelling, palpitations, dizziness or syncope.   Pt denies polydipsia, polyuria, or new focal neuro s/s.    Pt denies fever, wt loss, night sweats, loss of appetite, or other constitutional symptoms  No sick contacts or foods.  Does not o/w feel ill except also with ST, mild non prod cough.        Wt Readings from Last 3 Encounters:  03/13/24 236 lb 12.8 oz (107.4 kg)  01/21/24 234 lb (106.1 kg)  01/16/24 234 lb (106.1 kg)   BP Readings from Last 3 Encounters:  03/13/24 (!) 154/98  01/21/24 134/89  01/16/24 112/62         Past Medical History:  Diagnosis Date   Allergy    numerous food and drug allergies and hypersensitivities including hives and throat swelling for many substances, carries epipen    Arthritis    Asthma    very rare   Blood transfusion without reported diagnosis 07/2010   Chronic anemia    normocytic   Chronic folliculitis    Chronic kidney disease    Stage 2   COPD (chronic obstructive pulmonary disease) (HCC)    Dyspnea    Environmental and seasonal allergies    GERD (gastroesophageal reflux disease)    Glaucoma    right   H/O pericarditis    2010--  myopercarditis--  resolved   Headache(784.0)    HX SEVERE FRONTAL HA'S   Hiatal hernia    History of gastric ulcer    nonbleeding gastric ulcer 58//24   History of kidney stones    History of MRSA infection 2010   infected boil   HIV (human immunodeficiency virus infection) (HCC) 1988   Hypertension    Hypertensive cardiomyopathy (HCC)    EF 37%, normal coronaries 04/2020 [facility]; LVEF 60-65% 03/2022 echo   Lytic bone  lesion of hip    WORK-UP DONE BY ONCOLOGIST DR HA --  NOT MALIGNANT   Nausea & vomiting 09/04/2023   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   Pneumonia    hx of   Post concussion syndrome    resolved   Prostate cancer (HCC) 04/25/2013   gleason 3+3=6, volume 30 gm   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   CARDIAC CATHETERIZATION  03-23-2009  DR COOPER   NORMAL CORONARY ARTERIES   CARPAL TUNNEL RELEASE Right 08/09/2023   Procedure: RIGHT CARPAL TUNNEL RELEASE;  Surgeon: Murrell Drivers, MD;  Location: [facility];  Service: Orthopedics;  Laterality: Right;  75 MIN   CHOLECYSTECTOMY N/A 11/06/2014   Procedure: LAPAROSCOPIC CHOLECYSTECTOMY WITH INTRAOPERATIVE CHOLANGIOGRAM;  Surgeon: Lynwood Pina, MD;  Location: [facility] OR;  Service: General;  Laterality: N/A;   COLONOSCOPY  12/26/2011   Procedure: COLONOSCOPY;  Surgeon: Jerrell KYM Sol, MD;  Location: [facility];  Service: Endoscopy;  Laterality: N/A;   COLONOSCOPY     COLONOSCOPY WITH PROPOFOL  N/A 07/29/2014   Procedure: COLONOSCOPY WITH PROPOFOL ;  Surgeon: Jerrell KYM Sol, MD;  Location: [facility] END[facility]COPY;  Service: Endoscopy;  Laterality: N/A;   CYSTOSCOPY N/A 05/18/2018   Procedure: CYSTOSCOPY REMOVAL FOREIGN BODY;  Surgeon: Ottelin, Mark, MD;  Location: [facility];  Service: Urology;  Laterality: N/A;   CYSTOSCOPY/RETROGRADE/URETEROSCOPY Bilateral 04/25/2013   Procedure: CYSTOSCOPY/ BILATERAL RETROGRADES; BLADDER  BIOPSIES;  Surgeon: Ricardo Likens, MD;  Location: [facility];  Service: Urology;  Laterality: Bilateral;   DENTAL EXAMINATION UNDER ANESTHESIA     ESOPHAGOGASTRODUODENOSCOPY  12/26/2011   Procedure: ESOPHAGOGASTRODUODENOSCOPY (EGD);  Surgeon: Jerrell KYM Sol, MD;  Location: [facility];  Service: Endoscopy;  Laterality: N/A;   ESOPHAGOGASTRODUODENOSCOPY (EGD) WITH PROPOFOL  N/A 07/29/2014   Procedure: ESOPHAGOGASTRODUODENOSCOPY (EGD) WITH PROPOFOL ;  Surgeon: Jerrell KYM Sol, MD;  Location: [facility];  Service: Endoscopy;  Laterality: N/A;   EXCISION CHRONIC LEFT BREAST ABSCESS  09/21/2010   EXCISIONAL BX LEFT BREAST MASS/  I  &  D LEFT BREAST ABSCESS  03/24/2009   IR ANGIO INTRA EXTRACRAN SEL INTERNAL CAROTID BILAT MOD SED  10/23/2023   IR ANGIO VERTEBRAL SEL VERTEBRAL BILAT MOD SED  10/23/2023   JOINT REPLACEMENT  08/2016   KNEE ARTHROSCOPY Right 1985   left axilla biopsy  04/2013   left hip biopsy  10/2012   LYMPHADENECTOMY Bilateral 08/18/2013   Procedure: LYMPHADENECTOMY PELVIC LYMPH NODE DISSECTION;  Surgeon: Ricardo Likens, MD;  Location: [facility];  Service: Urology;  Laterality: Bilateral;   POSTERIOR CERVICAL FUSION/FORAMINOTOMY N/A 11/30/2022   Procedure: POSTERIOR DECOMPRESSION AND FUSION CERVICAL 4- CERVICAL 5, CERVICAL 5- CERVICAL 6, CERVICAL 6- CERVICAL 7, CERVICAL 7- THORACIC 1 WITH INSTRUMENTATION AND ALLOGRAFT;  Surgeon: Beuford Anes, MD;  Location: [facility];  Service: Orthopedics;  Laterality: N/A;   PROSTATE BIOPSY N/A 04/25/2013   Procedure: BIOPSY TRANSRECTAL ULTRASONIC PROSTATE (TUBP);  Surgeon: Ricardo Likens, MD;  Location: [facility];  Service: Urology;  Laterality: N/A;   ROBOT ASSISTED LAPAROSCOPIC RADICAL PROSTATECTOMY N/A 08/18/2013   Procedure: ROBOTIC ASSISTED LAPAROSCOPIC RADICAL PROSTATECTOMY;  Surgeon: Ricardo Likens, MD;  Location: [facility];  Service: Urology;  Laterality: N/A;   SHOULDER ARTHROSCOPY WITH ROTATOR CUFF  REPAIR AND SUBACROMIAL DECOMPRESSION Left 03/13/2023   Procedure: SHOULDER ARTHROSCOPY WITH ROTATOR CUFF REPAIR AND SUBACROMIAL DECOMPRESSION, DISTAL CLAVICLECTOMY, BICEPS TENODESIS;  Surgeon: Beverley Evalene BIRCH, MD;  Location: [facility];  Service: Orthopedics;  Laterality: Left;   TOTAL KNEE ARTHROPLASTY Right 08/01/2016   08/17/16   TOTAL KNEE ARTHROPLASTY Right 08/01/2016   Procedure: TOTAL KNEE ARTHROPLASTY;  Surgeon: Evalene BIRCH Beverley, MD;  Location: [facility];  Service: Orthopedics;  Laterality: Right;   ULNAR NERVE TRANSPOSITION Right 08/09/2023   Procedure: RIGHT ULNAR NERVE DECOMPRESSION AT ELBOW, POSSIBLE TRANSPOSITION;  Surgeon: Murrell Drivers, MD;  Location: [facility];  Service: Orthopedics;  Laterality: Right;   UPPER GASTROINTESTINAL ENDOSCOPY     UPPER LEG SOFT TISSUE BIOPSY Left 2012   lthigh    reports that he quit smoking about 13 years ago. His smoking use included cigars and cigarettes. He started smoking about 36 years  ago. He has a 34.5 pack-year smoking history. He has never used smokeless tobacco. He reports that he does not currently use alcohol . He reports that he does not use drugs. family history includes Asthma in his father, sister, and sister; Cancer in his father; Diabetes in his father and mother; Hypertension in his sister; Leukemia in his mother; Prostate cancer in his maternal uncle; Stroke in his father. Allergies  Allergen Reactions   Amoxapine Shortness Of Breath   Chocolate Shortness Of Breath and Other (See Comments)    Asthma attack, welts   Ciprofloxacin  Anaphylaxis, Hives, Shortness Of Breath, Itching, Nausea And Vomiting, Swelling and Dermatitis   Descovy  [Emtricitabine -Tenofovir  Af] Shortness Of Breath    Nasuea, vomiiting, diarrhea, sob, whelps, throat closes   Doxycycline  Hyclate Shortness Of Breath and Swelling   Genvoya  [Elviteg-Cobic-Emtricit-Tenofaf] Shortness Of Breath    Nausea, vomitting, diarreha, sob, rash, throat  closing   Milk-Related Compounds Other (See Comments)    Throat closing   Morphine  And Codeine Shortness Of Breath, Itching and Other (See Comments)     chest pain, hallucinations   Oxycodone -Acetaminophen  Shortness Of Breath   Penicillins Shortness Of Breath and Rash    Has patient had a PCN reaction causing immediate rash, facial/tongue/throat swelling, SOB or lightheadedness with hypotension: Yes Has patient had a PCN reaction causing severe rash involving mucus membranes or skin necrosis: No Has patient had a PCN reaction that required hospitalization No Has patient had a PCN reaction occurring within the last 10 years: Yes If all of the above answers are NO, then may proceed with Cephalosporin use.   Sulfa Antibiotics Anaphylaxis and Other (See Comments)   Tomato Shortness Of Breath   Vancomycin  Shortness Of Breath and Other (See Comments)   Amlodipine  Other (See Comments)    swelling   Carvedilol  Other (See Comments)    LEG SWELLING, DIZZNESS   Darunavir  Itching   Meloxicam  Hives   Acetaminophen  Rash and Other (See Comments)    Pt tolerates percocet and also plain APAP without rash   Flagyl  [Metronidazole ] Itching   Hydralazine  Other (See Comments)    Dizziness and leg swelling    Reglan  [Metoclopramide ]     Tremors   Toradol  [Ketorolac  Tromethamine ] Nausea And Vomiting    itching   Tramadol  Itching, Nausea And Vomiting and Other (See Comments)   Current Outpatient Medications on File Prior to Visit  Medication Sig Dispense Refill   albuterol  (PROVENTIL ) (2.5 MG/3ML) 0.083% nebulizer solution USE 1 VIAL VIA NEBULIZER EVERY 6 HOURS AS NEEDED FOR WHEEZING OR SHORTNESS OF BREATH 75 mL 3   albuterol  (VENTOLIN  HFA) 108 (90 Base) MCG/ACT inhaler Inhale 2 puffs into the lungs every 6 (six) hours as needed for wheezing or shortness of breath. 36 g 3   ALPRAZolam  (XANAX ) 0.5 MG tablet TAKE 1/2 TABLET(0.25 MG) BY MOUTH TWICE DAILY AS NEEDED FOR SLEEP 30 tablet 2   azelastine   (ASTELIN ) 0.1 % nasal spray Place 2 sprays into both nostrils in the morning. Use in each nostril as directed     beclomethasone (QVAR  REDIHALER) 40 MCG/ACT inhaler Inhale 2 puffs into the lungs 2 (two) times daily. 1 each 5   budeson-glycopyrrolate -formoterol  (BREZTRI  AEROSPHERE) 160-9-4.8 MCG/ACT AERO Inhale 2 puffs into the lungs in the morning and at bedtime. 10.7 g 6   cabotegravir  & rilpivirine  ER (CABENUVA ) 600 & 900 MG/3ML injection Inject into the muscle once a month for two months then every other month 6 mL 3   cetirizine  (ZYRTEC ) 10  MG tablet TAKE 1 TABLET(10 MG) BY MOUTH DAILY 90 tablet 3   Cholecalciferol  (VITAMIN D3) 250 MCG (10000 UT) capsule Take 1 tablet by mouth daily. [employer]     ciprofloxacin  (CIPRO ) 500 MG tablet Take 500 mg by mouth 2 (two) times daily.     cyclobenzaprine  (FLEXERIL ) 5 MG tablet Take 5-10 mg by mouth at bedtime as needed.     diltiazem  (CARDIZEM ) 30 MG tablet Take 1 tablet (30 mg total) by mouth 4 (four) times daily as needed (for heart racing/palpitations). 120 tablet 1   diphenhydrAMINE  (BENADRYL ) 25 MG tablet Take 1 tablet (25 mg total) by mouth every 6 (six) hours as needed. (Patient taking differently: Take 25 mg by mouth every 6 (six) hours as needed for sleep, itching or allergies.) 30 tablet 0   dorzolamide  (TRUSOPT ) 2 % ophthalmic solution Place 1 drop into both eyes 2 (two) times daily.     EPINEPHrine  0.3 mg/0.3 mL IJ SOAJ injection INJECT 1 PEN IN THE MUSCLE ONE TIME AS DIRECTED AS NEEDED FOR ANAPHYLAXIS 2 each 3   famotidine  (PEPCID ) 40 MG tablet Take 1 tablet (40 mg total) by mouth at bedtime. 90 tablet 1   gabapentin  (NEURONTIN ) 300 MG capsule Use one in AM and 2 at bedtime for neuralgia 120 capsule 4   Glucos-Chond-Hyal Ac-Ca Fructo (MOVE FREE JOINT HEALTH ADVANCE PO) Take 1 tablet by mouth daily.     HYDROcodone -acetaminophen  (NORCO/VICODIN) 5-325 MG tablet 1-2 tabs PO q6 hours prn pain 20 tablet 0   hydrOXYzine  (ATARAX ) 50 MG tablet TAKE  1 TABLET(50 MG) BY MOUTH TWICE DAILY 180 tablet 3   isosorbide  dinitrate (ISORDIL ) 20 MG tablet Take 1 tablet (20 mg total) by mouth 3 (three) times daily. 270 tablet 1   meclizine  (ANTIVERT ) 25 MG tablet Take 1 tablet (25 mg total) by mouth daily as needed. 40 tablet 5   methocarbamol  (ROBAXIN ) 500 MG tablet Take 1-2 tablets (500-1,000 mg total) by mouth every 6 (six) hours as needed for muscle spasms. 30 tablet 2   metoprolol  succinate (TOPROL -XL) 100 MG 24 hr tablet Take 1 tablet (100 mg total) by mouth daily. Take with or immediately following a meal. 90 tablet 3   montelukast  (SINGULAIR ) 10 MG tablet Take 10 mg by mouth daily.     MOTEGRITY  2 MG TABS Take 1 tablet (2 mg total) by mouth daily. PHARMACY-PLEASE PROVIDE NAME BRAND MOTEGRITY  AS THIS IS COVERED BY PT INSURANCE. THANK YOU 90 tablet 1   Multiple Vitamin (MULTIVITAMIN WITH MINERALS) TABS tablet Take 1 tablet by mouth daily after breakfast.      mupirocin  ointment (BACTROBAN ) 2 % APPLY EXTERNALLY TO THE AFFECTED AREA TWICE DAILY FOR 3 WEEKS, PLACE COTTON BETWEEN TOES TO ALLOW BETTER AERATION AS NEEDED (Patient taking differently: Apply 1 application  topically daily as needed (affected toes).) 66 g 1   naloxegol  oxalate (MOVANTIK ) 25 MG TABS tablet Take 1 tablet (25 mg total) by mouth daily. 90 tablet 1   OVER THE COUNTER MEDICATION Take 2 capsules by mouth daily. Zhou: thyroid  supplement     pantoprazole  (PROTONIX ) 40 MG tablet TAKE 1 TABLET(40 MG) BY MOUTH TWICE DAILY BEFORE A MEAL 180 tablet 3   QNASL  80 MCG/ACT AERS USE 2 SPRAYS IN EACH NOSTRIL DAILY (Patient taking differently: Place 2 sprays into both nostrils in the morning and at bedtime.) 10.6 g 2   QULIPTA  30 MG TABS Prn for migraine 30 tablet 5   rosuvastatin  (CRESTOR ) 20 MG tablet TAKE  1 TABLET(20 MG) BY MOUTH DAILY 90 tablet 3   tizanidine  (ZANAFLEX ) 6 MG capsule TAKE 1 CAPSULE(6 MG) BY MOUTH THREE TIMES DAILY 90 capsule 3   torsemide  (DEMADEX ) 20 MG tablet Take 10 mg by  mouth See admin instructions. Tuesday Thursday Saturday     Ubrogepant  (UBRELVY ) 100 MG TABS Take 1 tablet (100 mg total) by mouth as needed (take 1 tablet at onset of migraine. may take an additional tablet 2 hrs later if needed (Do not exceed more than 2 tablets in 24 hour period)). 16 tablet 5   nitroGLYCERIN  (NITROSTAT ) 0.4 MG SL tablet Place 1 tablet (0.4 mg total) under the tongue every 5 (five) minutes as needed for chest pain. 30 tablet 1   [DISCONTINUED] zolpidem  (AMBIEN ) 5 MG tablet Take 1-2 tablets (5-10 mg total) by mouth at bedtime as needed for sleep. (Patient not taking: Reported on 04/23/2019) 30 tablet 2   No current facility-administered medications on file prior to visit.        ROS:  All others reviewed and negative.  Objective        PE:  BP (!) 154/98   Pulse 87   Temp 98.1 F (36.7 C)   Ht 5' 9 (1.753 m)   Wt 236 lb 12.8 oz (107.4 kg)   SpO2 96%   BMI 34.97 kg/m                 Constitutional: Pt appears in NAD               HENT: Head: NCAT.                Right Ear: External ear normal.                 Left Ear: External ear normal.                Eyes: . Pupils are equal, round, and reactive to light. Conjunctivae and EOM are normal; pharynx with mild erythema               Nose: without d/c or deformity               Neck: Neck supple. Gross normal ROM               Cardiovascular: Normal rate and regular rhythm.                 Pulmonary/Chest: Effort normal and breath sounds without rales or wheezing.                Abd:  Soft, NT, ND, + BS, no organomegaly               Neurological: Pt is alert. At baseline orientation, motor grossly intact               Skin: Skin is warm. No rashes, no other new lesions, LE edema - none               Psychiatric: Pt behavior is normal without agitation   Micro: none  Cardiac tracings I have personally interpreted today:  none  Pertinent Radiological findings (summarize): none   Lab Results  Component Value  Date   WBC 3.9 (L) 10/23/2023   HGB 9.4 (L) 10/23/2023   HCT 29.6 (L) 10/23/2023   PLT 177 10/23/2023   GLUCOSE 87 11/30/2023   CHOL 123 06/11/2023   TRIG 90 06/11/2023   HDL 57 06/11/2023  LDLCALC 49 06/11/2023   ALT 28 09/04/2023   AST 27 09/04/2023   NA 141 11/30/2023   K 4.0 11/30/2023   CL 102 11/30/2023   CREATININE 1.30 (H) 11/30/2023   BUN 12 11/30/2023   CO2 24 11/30/2023   TSH 2.51 08/14/2022   PSA 0.00 (L) 08/14/2022   INR 1.0 10/23/2023   HGBA1C <4.2 08/15/2022   MICROALBUR 16.88 (H) 10/05/2011   Assessment/Plan:  Casey Brennan is a 63 y.o. American [place] or [place]  Native [3] Asian [4] Black or African American [2] White or Caucasian [1] male with  has a past medical history of Allergy, Arthritis, Asthma, Blood transfusion without reported diagnosis (07/2010), Chronic anemia, Chronic folliculitis, Chronic kidney disease, COPD (chronic obstructive pulmonary disease) (HCC), Dyspnea, Environmental and seasonal allergies, GERD (gastroesophageal reflux disease), Glaucoma, H/O pericarditis, Headache(784.0), Hiatal hernia, History of gastric ulcer, History of kidney stones, History of MRSA infection (2010), HIV (human immunodeficiency virus infection) (HCC) (1988), Hypertension, Hypertensive cardiomyopathy (HCC), Lytic bone lesion of hip, Nausea & vomiting (09/04/2023), 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  Upper respiratory infection Mild to mod, for antibx course zpack,  to f/u any worsening symptoms or concerns  Generalized abdominal pain Etiology unclear, exam benign, can't r/o cabenuva  side effects if tx for URI does not resolve things  HIV infection [facility]) May need ID f/u regarding cabenuva  side effects  Essential hypertension BP Readings from Last 3 Encounters:  03/13/24 (!) 154/98  01/21/24  134/89  01/16/24 112/62   Uncontrolled, likey reactive,, pt to continue medical treatment toprol  xl 100 mg every day, declines other change today  Followup: Return if symptoms worsen or fail to improve.  Lynwood Rush, MD 03/13/2024 4:58 PM  [facility]

## 2024-03-13 NOTE — Assessment & Plan Note (Signed)
 BP Readings from Last 3 Encounters:  03/13/24 (!) 154/98  01/21/24 134/89  01/16/24 112/62   Uncontrolled, likey reactive,, pt to continue medical treatment toprol  xl 100 mg every day, declines other change today

## 2024-03-13 NOTE — Assessment & Plan Note (Signed)
 May need ID f/u regarding cabenuva  side effects

## 2024-03-13 NOTE — Assessment & Plan Note (Signed)
Mild to mod, for antibx course zpack,  to f/u any worsening symptoms or concerns 

## 2024-03-13 NOTE — Assessment & Plan Note (Signed)
 Etiology unclear, exam benign, can't r/o cabenuva  side effects if tx for URI does not resolve things

## 2024-03-13 NOTE — Patient Instructions (Signed)
 Please take all new medication as prescribed - the antibiotic  If not improving, I agree with you that your GI symptoms may be due to the Cabenuva   Please continue all other medications as before, and refills have been done if requested.  Please have the pharmacy call with any other refills you may need.  Please keep your appointments with your specialists as you may have planned

## 2024-03-19 ENCOUNTER — Other Ambulatory Visit: Payer: Self-pay | Admitting: Internal Medicine

## 2024-03-19 DIAGNOSIS — G894 Chronic pain syndrome: Secondary | ICD-10-CM | POA: Diagnosis not present

## 2024-03-25 ENCOUNTER — Other Ambulatory Visit: Payer: Self-pay

## 2024-03-25 DIAGNOSIS — G4700 Insomnia, unspecified: Secondary | ICD-10-CM

## 2024-03-25 MED ORDER — ALPRAZOLAM 0.5 MG PO TABS: ORAL_TABLET | ORAL | 2 refills | Status: AC

## 2024-03-25 NOTE — Telephone Encounter (Signed)
 Pt Last Seen 11/22/2023 No Upcoming Appointment   Alprazolam  0.5mg  Last Filled 02/13/2024 30 day supply

## 2024-04-01 ENCOUNTER — Encounter: Admitting: Infectious Diseases

## 2024-04-02 ENCOUNTER — Telehealth: Payer: Self-pay | Admitting: Neurology

## 2024-04-02 ENCOUNTER — Encounter: Admitting: Infectious Diseases

## 2024-04-02 DIAGNOSIS — G43109 Migraine with aura, not intractable, without status migrainosus: Secondary | ICD-10-CM

## 2024-04-02 NOTE — Telephone Encounter (Signed)
 Pt wife called Stating that Pt Pharmacy has received PT Medication request but can not read half of Pt order is it possible to reach out to Pt Pharmacy or send out New order so Pt would receive Medication .  Ubrogepant  (UBRELVY ) 100 MG TAB    [id number] - , [place] - [address] AT [facility] OF [address] (Ph: [phone])

## 2024-04-03 ENCOUNTER — Other Ambulatory Visit: Payer: Self-pay

## 2024-04-03 MED ORDER — UBRELVY 100 MG PO TABS: 100.0000 mg | ORAL_TABLET | ORAL | 5 refills | Status: AC | PRN

## 2024-04-03 NOTE — Telephone Encounter (Signed)
 Spoke w/Pt sister who stated Pt has been taking Qulipta  daily even though at his last appt on 11/22/23 it was noted to change to PRN. Pt sister stated the Qulipta  is not really working even though he is taking it daily but when he takes the Ubrelvy  at the onset of a headache it works. Pt needs new script for Ubrelvy  as previous one is expired. Informed Pt sister will msg provider to see if she wants to D/C the Qulipta . Sister voiced understanding and thanks for the call.

## 2024-04-04 ENCOUNTER — Ambulatory Visit: Payer: 59

## 2024-04-04 VITALS — Ht 69.0 in | Wt 236.0 lb

## 2024-04-04 DIAGNOSIS — Z0000 Encounter for general adult medical examination without abnormal findings: Secondary | ICD-10-CM | POA: Diagnosis not present

## 2024-04-04 NOTE — Progress Notes (Signed)
 Subjective:  Please attest and cosign this visit due to patients primary care provider not being in the office at the time the visit was completed.  (Pt of Dr Casey Rush)   Casey Brennan is a 63 y.o. who presents for a Medicare Wellness preventive visit.  As a reminder, Annual Wellness Visits don't include a physical exam, and some assessments may be limited, especially if this visit is performed virtually. We may recommend an in-person follow-up visit with your provider if needed.  Visit Complete: Virtual I connected with  Casey Brennan on 04/04/24 by a audio enabled telemedicine application and verified that I am speaking with the correct person using two identifiers.  Patient Location: Home  Provider Location: Office/Clinic  I discussed the limitations of evaluation and management by telemedicine. The patient expressed understanding and agreed to proceed.  Vital Signs: Because this visit was a virtual/telehealth visit, some criteria may be missing or patient reported. Any vitals not documented were not able to be obtained and vitals that have been documented are patient reported.  VideoDeclined- This patient declined Librarian, academic. Therefore the visit was completed with audio only.  Persons Participating in Visit: Patient.  Assisted by his Sister, [name].  AWV Questionnaire: No: Patient Medicare AWV questionnaire was not completed prior to this visit.  Cardiac Risk Factors include: advanced age (>42men, >47 women);dyslipidemia;hypertension;male gender;obesity (BMI >30kg/m2)     Objective:    Today's Vitals   04/04/24 1441  Weight: 236 lb (107 kg)  Height: 5' 9 (1.753 m)   Body mass index is 34.85 kg/m.     04/04/2024    2:41 PM 10/23/2023    7:59 AM 09/07/2023    2:22 AM 08/09/2023   10:01 AM 08/03/2023   11:04 AM 03/30/2023    2:45 PM 03/13/2023    6:28 AM  Advanced Directives  Does Patient Have a Medical Advance Directive? Yes  Yes No Yes Yes Yes Yes  Type of Estate agent of [place];Living will Healthcare Power of [place];Living will  Healthcare Power of [place];Living will Healthcare Power of [place];Living will Healthcare Power of [employer] Power of Attorney  Does patient want to make changes to medical advance directive? No - Patient declined No - Patient declined  No - Patient declined   No - Patient declined  Copy of Healthcare Power of Attorney in Chart? Yes - validated most recent copy scanned in chart (See row information)   Yes - validated most recent copy scanned in chart (See row information) Yes - validated most recent copy scanned in chart (See row information) No - copy requested No - copy requested    Current Medications (verified) Outpatient Encounter Medications as of 04/04/2024  Medication Sig   albuterol  (PROVENTIL ) (2.5 MG/3ML) 0.083% nebulizer solution USE 1 VIAL VIA NEBULIZER EVERY 6 HOURS AS NEEDED FOR WHEEZING OR SHORTNESS OF BREATH   albuterol  (VENTOLIN  HFA) 108 (90 Base) MCG/ACT inhaler Inhale 2 puffs into the lungs every 6 (six) hours as needed for wheezing or shortness of breath.   ALPRAZolam  (XANAX ) 0.5 MG tablet TAKE 1/2 TABLET(0.25 MG) BY MOUTH TWICE DAILY AS NEEDED FOR SLEEP   azelastine  (ASTELIN ) 0.1 % nasal spray Place 2 sprays into both nostrils in the morning. Use in each nostril as directed   beclomethasone (QVAR  REDIHALER) 40 MCG/ACT inhaler Inhale 2 puffs into the lungs 2 (two) times daily.   budeson-glycopyrrolate -formoterol  (BREZTRI  AEROSPHERE) 160-9-4.8 MCG/ACT AERO Inhale 2 puffs into the lungs  in the morning and at bedtime.   cabotegravir  & rilpivirine  ER (CABENUVA ) 600 & 900 MG/3ML injection Inject into the muscle once a month for two months then every other month   cetirizine  (ZYRTEC ) 10 MG tablet TAKE 1 TABLET(10 MG) BY MOUTH DAILY   Cholecalciferol  (VITAMIN D3) 250 MCG (10000 UT) capsule Take 1 tablet by mouth daily. [employer]    ciprofloxacin  (CIPRO ) 500 MG tablet Take 500 mg by mouth 2 (two) times daily.   cyclobenzaprine  (FLEXERIL ) 5 MG tablet Take 5-10 mg by mouth at bedtime as needed.   diltiazem  (CARDIZEM ) 30 MG tablet Take 1 tablet (30 mg total) by mouth 4 (four) times daily as needed (for heart racing/palpitations).   diphenhydrAMINE  (BENADRYL ) 25 MG tablet Take 1 tablet (25 mg total) by mouth every 6 (six) hours as needed.   dorzolamide  (TRUSOPT ) 2 % ophthalmic solution Place 1 drop into both eyes 2 (two) times daily.   EPINEPHrine  0.3 mg/0.3 mL IJ SOAJ injection INJECT 1 PEN IN THE MUSCLE ONE TIME AS DIRECTED AS NEEDED FOR ANAPHYLAXIS   famotidine  (PEPCID ) 40 MG tablet Take 1 tablet (40 mg total) by mouth at bedtime.   gabapentin  (NEURONTIN ) 300 MG capsule Use one in AM and 2 at bedtime for neuralgia   Glucos-Chond-Hyal Ac-Ca Fructo (MOVE FREE JOINT HEALTH ADVANCE PO) Take 1 tablet by mouth daily.   HYDROcodone -acetaminophen  (NORCO/VICODIN) 5-325 MG tablet 1-2 tabs PO q6 hours prn pain   hydrOXYzine  (ATARAX ) 50 MG tablet TAKE 1 TABLET(50 MG) BY MOUTH TWICE DAILY   isosorbide  dinitrate (ISORDIL ) 20 MG tablet Take 1 tablet (20 mg total) by mouth 3 (three) times daily.   meclizine  (ANTIVERT ) 25 MG tablet Take 1 tablet (25 mg total) by mouth daily as needed.   methocarbamol  (ROBAXIN ) 500 MG tablet Take 1-2 tablets (500-1,000 mg total) by mouth every 6 (six) hours as needed for muscle spasms.   metoprolol  succinate (TOPROL -XL) 100 MG 24 hr tablet Take 1 tablet (100 mg total) by mouth daily. Take with or immediately following a meal.   montelukast  (SINGULAIR ) 10 MG tablet Take 10 mg by mouth daily.   MOTEGRITY  2 MG TABS Take 1 tablet (2 mg total) by mouth daily. PHARMACY-PLEASE PROVIDE NAME BRAND MOTEGRITY  AS THIS IS COVERED BY PT INSURANCE. THANK YOU   Multiple Vitamin (MULTIVITAMIN WITH MINERALS) TABS tablet Take 1 tablet by mouth daily after breakfast.    mupirocin  ointment (BACTROBAN ) 2 % APPLY EXTERNALLY TO THE  AFFECTED AREA TWICE DAILY FOR 3 WEEKS, PLACE COTTON BETWEEN TOES TO ALLOW BETTER AERATION AS NEEDED (Patient taking differently: Apply 1 application  topically daily as needed (affected toes).)   naloxegol  oxalate (MOVANTIK ) 25 MG TABS tablet TAKE 1 TABLET(25 MG) BY MOUTH DAILY   nitroGLYCERIN  (NITROSTAT ) 0.4 MG SL tablet Place 1 tablet (0.4 mg total) under the tongue every 5 (five) minutes as needed for chest pain.   ondansetron  (ZOFRAN -ODT) 4 MG disintegrating tablet Take 1 tablet (4 mg total) by mouth every 8 (eight) hours as needed for nausea or vomiting.   OVER THE COUNTER MEDICATION Take 2 capsules by mouth daily. Zhou: thyroid  supplement   pantoprazole  (PROTONIX ) 40 MG tablet TAKE 1 TABLET(40 MG) BY MOUTH TWICE DAILY BEFORE A MEAL   QNASL  80 MCG/ACT AERS USE 2 SPRAYS IN EACH NOSTRIL DAILY   QULIPTA  30 MG TABS Prn for migraine   rosuvastatin  (CRESTOR ) 20 MG tablet TAKE 1 TABLET(20 MG) BY MOUTH DAILY   tizanidine  (ZANAFLEX ) 6 MG capsule TAKE 1 CAPSULE(6  MG) BY MOUTH THREE TIMES DAILY   torsemide  (DEMADEX ) 20 MG tablet Take 10 mg by mouth See admin instructions. Tuesday Thursday Saturday   Ubrogepant  (UBRELVY ) 100 MG TABS Take 1 tablet (100 mg total) by mouth as needed (take 1 tablet at onset of migraine. may take an additional tablet 2 hrs later if needed (Do not exceed more than 2 tablets in 24 hour period)).   [DISCONTINUED] zolpidem  (AMBIEN ) 5 MG tablet Take 1-2 tablets (5-10 mg total) by mouth at bedtime as needed for sleep. (Patient not taking: Reported on 04/23/2019)   No facility-administered encounter medications on file as of 04/04/2024.    Allergies (verified) Amoxapine, Chocolate, Ciprofloxacin , Descovy  [emtricitabine -tenofovir  af], Doxycycline  hyclate, Genvoya  [elviteg-cobic-emtricit-tenofaf], Milk-related compounds, Morphine  and codeine, Oxycodone -acetaminophen , Penicillins, Sulfa antibiotics, Tomato, Vancomycin , Amlodipine , Carvedilol , Darunavir , Meloxicam , Acetaminophen , Flagyl   [metronidazole ], Hydralazine , Reglan  [metoclopramide ], Toradol  [ketorolac  tromethamine ], and Tramadol    History: Past Medical History:  Diagnosis Date   Allergy    numerous food and drug allergies and hypersensitivities including hives and throat swelling for many substances, carries epipen    Arthritis    Asthma    very rare   Blood transfusion without reported diagnosis 07/2010   Chronic anemia    normocytic   Chronic folliculitis    Chronic kidney disease    Stage 2   COPD (chronic obstructive pulmonary disease) (HCC)    Dyspnea    Environmental and seasonal allergies    GERD (gastroesophageal reflux disease)    Glaucoma    right   H/O pericarditis    2010--  myopercarditis--  resolved   Headache(784.0)    HX SEVERE FRONTAL HA'S   Hiatal hernia    History of gastric ulcer    nonbleeding gastric ulcer 58//24   History of kidney stones    History of MRSA infection 2010   infected boil   HIV (human immunodeficiency virus infection) (HCC) 1988   Hypertension    Hypertensive cardiomyopathy (HCC)    EF 37%, normal coronaries 04/2020 [facility]; LVEF 60-65% 03/2022 echo   Lytic bone lesion of hip    WORK-UP DONE BY ONCOLOGIST DR HA --  NOT MALIGNANT   Nausea & vomiting 09/04/2023   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   Pneumonia    hx of   Post concussion syndrome    resolved   Prostate cancer (HCC) 04/25/2013   gleason 3+3=6, volume 30 gm   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   CARDIAC CATHETERIZATION  03-23-2009  DR COOPER   NORMAL CORONARY ARTERIES   CARPAL TUNNEL RELEASE Right 08/09/2023   Procedure: RIGHT CARPAL TUNNEL RELEASE;  Surgeon: Murrell Drivers, MD;  Location: [facility];  Service: Orthopedics;  Laterality: Right;  75 MIN   CHOLECYSTECTOMY N/A 11/06/2014   Procedure: LAPAROSCOPIC CHOLECYSTECTOMY WITH INTRAOPERATIVE CHOLANGIOGRAM;  Surgeon: Casey Pina, MD;  Location: [facility] OR;  Service: General;  Laterality: N/A;   COLONOSCOPY  12/26/2011   Procedure: COLONOSCOPY;  Surgeon: Jerrell KYM Sol,  MD;  Location: [facility];  Service: Endoscopy;  Laterality: N/A;   COLONOSCOPY     COLONOSCOPY WITH PROPOFOL  N/A 07/29/2014   Procedure: COLONOSCOPY WITH PROPOFOL ;  Surgeon: Jerrell KYM Sol, MD;  Location: [facility];  Service: Endoscopy;  Laterality: N/A;   CYSTOSCOPY N/A 05/18/2018   Procedure: CYSTOSCOPY REMOVAL FOREIGN BODY;  Surgeon: Ottelin, Mark, MD;  Location: [facility];  Service: Urology;  Laterality: N/A;   CYSTOSCOPY/RETROGRADE/URETEROSCOPY Bilateral 04/25/2013   Procedure: CYSTOSCOPY/ BILATERAL RETROGRADES; BLADDER BIOPSIES;  Surgeon: Ricardo Likens, MD;  Location[facility];  Service: Urology;  Laterality: Bilateral;   DENTAL EXAMINATION UNDER ANESTHESIA     ESOPHAGOGASTRODUODENOSCOPY  12/26/2011   Procedure: ESOPHAGOGASTRODUODENOSCOPY (EGD);  Surgeon: Jerrell KYM Sol, MD;  Location: [facility];  Service: Endoscopy;  Laterality: N/A;   ESOPHAGOGASTRODUODENOSCOPY (EGD) WITH PROPOFOL  N/A 07/29/2014   Procedure: ESOPHAGOGASTRODUODENOSCOPY (EGD) WITH PROPOFOL ;  Surgeon: Jerrell KYM Sol, MD;  Location: [facility];  Service: Endoscopy;  Laterality: N/A;   EXCISION CHRONIC LEFT BREAST ABSCESS  09/21/2010   EXCISIONAL BX LEFT BREAST MASS/  I  &  D LEFT BREAST ABSCESS  03/24/2009   IR ANGIO INTRA EXTRACRAN SEL INTERNAL CAROTID BILAT MOD SED  10/23/2023   IR ANGIO VERTEBRAL SEL VERTEBRAL BILAT  MOD SED  10/23/2023   JOINT REPLACEMENT  08/2016   KNEE ARTHROSCOPY Right 1985   left axilla biopsy  04/2013   left hip biopsy  10/2012   LYMPHADENECTOMY Bilateral 08/18/2013   Procedure: LYMPHADENECTOMY PELVIC LYMPH NODE DISSECTION;  Surgeon: Ricardo Likens, MD;  Location: [facility];  Service: Urology;  Laterality: Bilateral;   POSTERIOR CERVICAL FUSION/FORAMINOTOMY N/A 11/30/2022   Procedure: POSTERIOR DECOMPRESSION AND FUSION CERVICAL 4- CERVICAL 5, CERVICAL 5- CERVICAL 6, CERVICAL 6- CERVICAL 7, CERVICAL 7- THORACIC 1 WITH INSTRUMENTATION AND ALLOGRAFT;  Surgeon: Beuford Anes, MD;  Location: [facility];  Service: Orthopedics;  Laterality: N/A;   PROSTATE BIOPSY N/A 04/25/2013   Procedure: BIOPSY TRANSRECTAL ULTRASONIC PROSTATE (TUBP);  Surgeon: Ricardo Likens, MD;  Location: [facility];  Service: Urology;  Laterality: N/A;   ROBOT ASSISTED LAPAROSCOPIC RADICAL PROSTATECTOMY N/A 08/18/2013   Procedure: ROBOTIC ASSISTED LAPAROSCOPIC RADICAL PROSTATECTOMY;  Surgeon: Ricardo Likens, MD;  Location: [facility];  Service: Urology;  Laterality: N/A;   SHOULDER ARTHROSCOPY WITH ROTATOR CUFF REPAIR AND SUBACROMIAL DECOMPRESSION Left 03/13/2023   Procedure: SHOULDER ARTHROSCOPY WITH ROTATOR CUFF REPAIR AND SUBACROMIAL DECOMPRESSION, DISTAL CLAVICLECTOMY, BICEPS TENODESIS;  Surgeon: Beverley Evalene BIRCH, MD;  Location: [facility];  Service: Orthopedics;  Laterality: Left;   TOTAL KNEE ARTHROPLASTY Right 08/01/2016   08/17/16   TOTAL KNEE ARTHROPLASTY Right 08/01/2016   Procedure: TOTAL KNEE ARTHROPLASTY;  Surgeon: Evalene BIRCH Beverley, MD;  Location: [facility];  Service: Orthopedics;  Laterality: Right;   ULNAR NERVE TRANSPOSITION Right 08/09/2023   Procedure: RIGHT ULNAR NERVE DECOMPRESSION AT ELBOW, POSSIBLE TRANSPOSITION;  Surgeon: Murrell Drivers, MD;  Location: [facility];  Service: Orthopedics;  Laterality: Right;   UPPER GASTROINTESTINAL ENDOSCOPY     UPPER LEG SOFT TISSUE  BIOPSY Left 2012   lthigh   Family History  Problem Relation Age of Onset   Leukemia Mother    Diabetes Mother    Stroke Father    Diabetes Father    Cancer Father        brain cancer   Asthma Father    Hypertension Sister    Asthma Sister    Asthma Sister    Prostate cancer Maternal Uncle  Colon cancer Neg Hx    Colon polyps Neg Hx    Esophageal cancer Neg Hx    Stomach cancer Neg Hx    Rectal cancer Neg Hx    Social History   Socioeconomic History   Marital status: Divorced    Spouse name: Not on file   Number of children: 1   Years of education: College   Highest education level: Associate degree: occupational, Scientist, product/process development, or vocational program  Occupational History   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 declined condoms  Other Topics Concern   Not on file  Social History Narrative   Sister April stays with him currently although he hopes to return to truck driving. 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 Sister is starting medical school next year.   Patient is divorced and lives with his sister.   Patient has one adult son.   Patient is part-time, driving a truck.   Patient has a college education.   Patient is right-handed.   Patient drinks 2-3 cups of coffee and a 4-5 cups of tea.   Social Drivers of Corporate investment banker Strain: Low Risk   (04/04/2024)   Overall Financial Resource Strain (CARDIA)    Difficulty of Paying Living Expenses: Not hard at all  Food Insecurity: No Food Insecurity (04/04/2024)   Hunger Vital Sign    Worried About Running Out of Food in the Last Year: Never true    Ran Out of Food in the Last Year: Never true  Transportation Needs: No Transportation Needs (04/04/2024)   PRAPARE - Administrator, Civil Service (Medical): No    Lack of Transportation (Non-Medical): No  Physical Activity: Sufficiently Active (04/04/2024)   Exercise Vital Sign    Days of Exercise per Week: 5 days    Minutes of Exercise per Session: 30 min  Recent Concern: Physical Activity - Insufficiently Active (03/12/2024)   Exercise Vital Sign    Days of Exercise per Week: 5 days    Minutes of Exercise per Session: 10 min  Stress: No Stress Concern Present (04/04/2024)   [employer] of Occupational Health - Occupational Stress Questionnaire    Feeling of Stress: Not at all  Social Connections: Moderately Isolated (04/04/2024)   Social Connection and Isolation Panel    Frequency of Communication with Friends and Family: More than three times a week    Frequency of Social Gatherings with Friends and Family: More than three times a week    Attends Religious Services: Never    Database administrator or Organizations: No    Attends Banker Meetings: Never    Marital Status: Married    Tobacco Counseling Counseling given: Not Answered    Clinical  Intake:  Pre-visit preparation completed: Yes  Pain : No/denies pain     BMI - recorded: 34.85 Nutritional Status: BMI > 30  Obese Nutritional Risks: None Diabetes: No  Lab Results  Component Value Date   HGBA1C <4.2 08/15/2022   HGBA1C <4.0 08/25/2021   HGBA1C (L) 10/10/2010    4.3 (NOTE)                                                                       According to the ADA Clinical Practice Recommendations for 2011, when HbA1c is used as a  screening test:   >=6.5%   Diagnostic of Diabetes Mellitus           (if abnormal result  is confirmed)  5.7-6.4%   Increased risk of developing Diabetes Mellitus  References:Diagnosis and Classification of Diabetes Mellitus,Diabetes Care,2011,34(Suppl 1):S62-S69 and Standards of Medical Care in         Diabetes - 2011,Diabetes Care,2011,34  (Suppl 1):S11-S61.     How often do you need to have someone help you when you read instructions, pamphlets, or other written materials from your doctor or pharmacy?: 1 - Never  Interpreter Needed?: No  Information entered by :: Verdie Saba, CMA   Activities of Daily Living     04/04/2024    2:43 PM 08/09/2023   10:06 AM  In your present state of health, do you have any difficulty performing the following activities:  Hearing? 0 0  Vision? 0 0  Difficulty concentrating or making decisions? 0 0  Walking or climbing stairs? 0   Dressing or bathing? 0   Doing errands, shopping? 0   Preparing Food and eating ? N   Using the Toilet? N   In the past six months, have you accidently leaked urine? Y   Do you have problems with loss of bowel control? N   Managing your Medications? N   Managing your Finances? N   Housekeeping or managing your Housekeeping? N     Patient Care Team: Norleen Casey ORN, MD as PCP - General (Internal Medicine) Elmira Newman PARAS, MD as PCP - Cardiology (Cardiology) Cindie Ole DASEN, MD as PCP - Electrophysiology (Cardiology) Gaston Hamilton, MD as Attending Physician (Urology) Dianna Specking, MD as Attending Physician (Gastroenterology) Ebbie Cough, MD as Attending Physician (General Surgery) Tobie Gordy POUR, MD (Nephrology) Twana Jeneal DASEN, MD (Hematology and Oncology) Raenell Elspeth Pac, OD as Consulting Physician (Optometry) Bond, Reyes Mcardle, MD as Referring Physician (Ophthalmology)  I have updated your Care Teams any recent Medical Services you may have received from other providers in the past year.      Assessment:   This is a routine wellness examination for Casey Brennan.  Hearing/Vision screen Hearing Screening - Comments:: Denies hearing difficulties   Vision Screening - Comments:: Wears rx glasses - up to date with routine eye exams with [facility]   Goals Addressed               This Visit's Progress     Patient Stated (pt-stated)        Patient stated he plans to continue exercising       Depression Screen     04/04/2024    2:45 PM 03/13/2024  3:57 PM 09/04/2023    3:34 PM 07/18/2023   11:16 AM 05/09/2023    4:03 PM 03/30/2023    3:14 PM 03/27/2023    1:11 PM  PHQ 2/9 Scores  PHQ - 2 Score 0 0 0 0 0 0 0  PHQ- 9 Score 0 3 0 0 0 0     Fall Risk     04/04/2024    2:45 PM 03/13/2024    3:56 PM 07/18/2023   11:16 AM 05/09/2023    4:03 PM 03/27/2023    3:34 PM  Fall Risk   Falls in the past year? 0 0 0 0 1  Number falls in past yr: 0 0 0 0 1  Injury with Fall? 0 0 0 0 1  Risk for fall due to : No Fall Risks No Fall Risks  No Fall Risks   Follow up Falls evaluation completed;Falls prevention discussed Falls evaluation completed Falls evaluation completed Falls evaluation completed Falls evaluation completed;Falls prevention discussed    MEDICARE RISK AT HOME:  Medicare Risk at Home Any stairs in or around the home?: Yes (outside) If so, are there any without handrails?: No Home free of loose throw rugs in walkways, pet beds, electrical cords, etc?: Yes Adequate lighting in your home to reduce risk of falls?: Yes Life alert?: No Use of a cane, walker or w/c?: No Grab bars in the bathroom?: No Shower chair or bench in shower?: No Elevated toilet seat or a handicapped toilet?: No  TIMED UP AND GO:  Was the test performed?  No  Cognitive Function: Declined/Normal: No cognitive concerns noted by patient or family. Patient alert, oriented, able to answer questions appropriately and recall recent events. No signs of memory loss or confusion.    04/04/2024    2:47 PM   MMSE - Mini Mental State Exam  Not completed: Refused        03/30/2023    3:02 PM 04/07/2022    9:37 AM  6CIT Screen  What Year? -- 0 points  What month?  0 points  What time?  0 points  Count back from 20  0 points  Months in reverse  0 points  Repeat phrase  0 points  Total Score  0 points    Immunizations Immunization History  Administered Date(s) Administered   Hepatitis A 06/12/2011   INFLUENZA, HIGH DOSE SEASONAL PF 06/02/2021   Influenza Split 06/12/2012   Influenza Whole 04/19/2009, 07/25/2010, 02/22/2011   Influenza,inj,Quad PF,6+ Mos 06/04/2013, 04/27/2014, 07/21/2015, 05/22/2016, 04/27/2017, 05/20/2020, 04/06/2022   Influenza-Unspecified 01/30/2015   Meningococcal Mcv4o 05/22/2016   PFIZER Comirnaty(Gray Top)Covid-19 Tri-Sucrose Vaccine 05/25/2020   PFIZER(Purple Top)SARS-COV-2 Vaccination 10/23/2019, 11/13/2019, 05/25/2020   PPD Test 05/05/2009   Pfizer Covid-19 Vaccine Bivalent Booster 82yrs & up 04/08/2021   Pneumococcal Conjugate-13 11/11/2020   Pneumococcal Polysaccharide-23 03/22/2009, 08/19/2013, 11/15/2020   Tdap 06/12/2011   Zoster Recombinant(Shingrix ) 12/27/2020, 04/06/2022, 06/14/2022    Screening Tests Health Maintenance  Topic Date Due   DTaP/Tdap/Td (2 - Td or Tdap) 06/11/2021   Hepatitis B Vaccines 19-59 Average Risk (1 of 3 - Risk 3-dose series) Never done   Influenza Vaccine  02/01/2024   COVID-19 Vaccine (6 - 2025-26 season) 03/03/2024   Lung Cancer Screening  12/24/2024   Medicare Annual Wellness (AWV)  04/04/2025   Pneumococcal Vaccine: 50+ Years (4 of 4 - PCV20 or PCV21) 11/15/2025   Colonoscopy  01/17/2030   Hepatitis C Screening  Completed   HIV Screening  Completed  Zoster Vaccines- Shingrix   Completed   HPV VACCINES  Aged Out   Meningococcal B Vaccine  Aged Out    Health Maintenance Items Addressed:  04/04/2024  Additional Screening:  Vision Screening: Recommended annual ophthalmology exams for early detection of  glaucoma and other disorders of the eye. Is the patient up to date with their annual eye exam?  Yes  Who is the provider or what is the name of the office in which the patient attends annual eye exams? [facility]  Dental Screening: Recommended annual dental exams for proper oral hygiene  Community Resource Referral / Chronic Care Management: CRR required this visit?  No   CCM required this visit?  No   Plan:    I have personally reviewed and noted the following in the patient's chart:   Medical and social history Use of alcohol , tobacco or illicit drugs  Current medications and supplements including opioid prescriptions. Patient is not currently taking opioid prescriptions. Functional ability and status Nutritional status Physical activity Advanced directives List of other physicians Hospitalizations, surgeries, and ER visits in previous 12 months Vitals Screenings to include cognitive, depression, and falls Referrals and appointments  In addition, I have reviewed and discussed with patient certain preventive protocols, quality metrics, and best practice recommendations. A written personalized care plan for preventive services as well as general preventive health recommendations were provided to patient.   Verdie CHRISTELLA Saba, CMA   04/04/2024   After Visit Summary: (MyChart) Due to this being a telephonic visit, the after visit summary with patients personalized plan was offered to patient via MyChart   Notes: Patient plans to call to schedule an appt w/PCP for a Physical.

## 2024-04-04 NOTE — Patient Instructions (Addendum)
 Casey Brennan,  Thank you for taking the time for your Medicare Wellness Visit. I appreciate your continued commitment to your health goals. Please review the care plan we discussed, and feel free to reach out if I can assist you further.  Medicare recommends these wellness visits once per year to help you and your care team stay ahead of potential health issues. These visits are designed to focus on prevention, allowing your provider to concentrate on managing your acute and chronic conditions during your regular appointments.  Please note that Annual Wellness Visits do not include a physical exam. Some assessments may be limited, especially if the visit was conducted virtually. If needed, we may recommend a separate in-person follow-up with your provider.  Ongoing Care Seeing your primary care provider every 3 to 6 months helps us  monitor your health and provide consistent, personalized care.   Referrals If a referral was made during today's visit and you haven't received any updates within two weeks, please contact the referred provider directly to check on the status.  Recommended Screenings:  Health Maintenance  Topic Date Due   DTaP/Tdap/Td vaccine (2 - Td or Tdap) 06/11/2021   Hepatitis B Vaccine (1 of 3 - Risk 3-dose series) Never done   Flu Shot  02/01/2024   COVID-19 Vaccine (6 - 2025-26 season) 03/03/2024   Screening for Lung Cancer  12/24/2024   Medicare Annual Wellness Visit  04/04/2025   Pneumococcal Vaccine for age over 51 (4 of 4 - PCV20 or PCV21) 11/15/2025   Colon Cancer Screening  01/17/2030   Hepatitis C Screening  Completed   HIV Screening  Completed   Zoster (Shingles) Vaccine  Completed   HPV Vaccine  Aged Out   Meningitis B Vaccine  Aged Out       04/04/2024    2:41 PM  Advanced Directives  Does Patient Have a Medical Advance Directive? Yes  Type of Estate agent of [place];Living will  Does patient want to make changes to medical  advance directive? No - Patient declined  Copy of Healthcare Power of Attorney in Chart? Yes - validated most recent copy scanned in chart (See row information)   Advance Care Planning is important because it: Ensures you receive medical care that aligns with your values, goals, and preferences. Provides guidance to your family and loved ones, reducing the emotional burden of decision-making during critical moments.  Vision: Annual vision screenings are recommended for early detection of glaucoma, cataracts, and diabetic retinopathy. These exams can also reveal signs of chronic conditions such as diabetes and high blood pressure.  Dental: Annual dental screenings help detect early signs of oral cancer, gum disease, and other conditions linked to overall health, including heart disease and diabetes.

## 2024-04-09 ENCOUNTER — Other Ambulatory Visit (HOSPITAL_COMMUNITY)
Admission: RE | Admit: 2024-04-09 | Discharge: 2024-04-09 | Disposition: A | Discharge status: Home / Self Care | Source: Ambulatory Visit | Attending: Infectious Diseases | Admitting: Infectious Diseases

## 2024-04-09 ENCOUNTER — Ambulatory Visit: Admitting: Infectious Diseases

## 2024-04-09 VITALS — BP 145/91 | HR 66 | Temp 98.0°F | Ht 69.0 in | Wt 237.0 lb

## 2024-04-09 DIAGNOSIS — Z113 Encounter for screening for infections with a predominantly sexual mode of transmission: Secondary | ICD-10-CM

## 2024-04-09 DIAGNOSIS — R062 Wheezing: Secondary | ICD-10-CM

## 2024-04-09 DIAGNOSIS — K5901 Slow transit constipation: Secondary | ICD-10-CM

## 2024-04-09 DIAGNOSIS — Z21 Asymptomatic human immunodeficiency virus [HIV] infection status: Secondary | ICD-10-CM | POA: Diagnosis not present

## 2024-04-09 DIAGNOSIS — Z79899 Other long term (current) drug therapy: Secondary | ICD-10-CM | POA: Diagnosis not present

## 2024-04-09 DIAGNOSIS — B20 Human immunodeficiency virus [HIV] disease: Secondary | ICD-10-CM

## 2024-04-09 NOTE — Assessment & Plan Note (Signed)
 He'll continue his current inh He is getting relief from them.  Encouraged him to f/u with Dr Jude.

## 2024-04-09 NOTE — Assessment & Plan Note (Signed)
 Will check his labs today He has some concerns about wt gain and recent cabaneuva GI upset.  Will be glad to change him back to pills if he wishes- wants to try again with IM Rtc 3-4 months

## 2024-04-09 NOTE — Assessment & Plan Note (Signed)
 Encouraged him to drink more water  Suspect from his gastroparesis meds Encouraged him to drink more water , at least 1 L /day Try MgCitrate if not better He will f/u with Dr Albertus Nov 2025.

## 2024-04-09 NOTE — Addendum Note (Signed)
 Addended by: ANTONE DWAYNE SAILOR on: 04/09/2024 03:58 PM   Modules accepted: Orders

## 2024-04-09 NOTE — Progress Notes (Signed)
 Subjective:    Patient ID: Casey Brennan Nephew, male  DOB: 29-Jan-1961, 63 y.o.        MRN: [id number]   HPI 63 yo M with hx HIV+ (2012), COPD Gold,  and prostate Ca.  Had radical prostatectomy 08-18-13, adenoCA, LN (-), margins (-).  Cont to have uro f/u (next 04-2022).  Had R TKR 08-2016   Was changed to genvoya , prezista  (05-2016). He developed a rash from the sulfa in the darunavir , changed back to his prev rx.  Prev ? Allergy to descovey (more likely darunavir ).He was previously changed to biktarvy  from ISN/TRV .  Changed to cabaneuva jAN 2025.    09-29-20: ANTERIOR CERVICAL DECOMPRESSION FUSION CERVICAL 3-CERVICAL 4 , CERVICAL 4 - CERVICAL 5, CERVICAL 5 - CERVICAL 6 WITH INSTRUMENTATION AND ALLOGRAFT Planning to have more surgery sometime in the next 2 weeks. Has not had worsening pain, has had falls however.    Was working as an Psychiatric nurse.  He had meningitis 06-2021. HSV, VZV PCR (-), crypto (-).    Has seen by ophtho- concern for cataracts, glaucoma. Going to [employer].    He had some concerning findings on his previous abd imaging- most recent shows renal cyst/hemorrhagic. R adrenal nodule.     after his last dose of cabaneuva, he had GI upset. These have mostly resolved although he does still have some bloating, constipation.  Colon due 2031. Takes occas dulcolax. Not sure he drinks enough water . Has been followed by Dr Albertus at GI for gastroparesis.   Takes motegrity , movantic. Protonix , pepcid . Also taking norco 5-325 daily prn.   Occas wheezing. Worse at night @[facility] . Improved with inhalers. Occas with working in yard, moving. Albuterol ,  breztri , qva,. singulair , zyrtec . Sees Dr Jude.   HIV 1 RNA Quant  Date Value  06/29/2021 597 Copies/mL (H)  11/11/2020 NOT DETECTED copies/mL  03/11/2020 <20 Copies/mL   HIV-1 RNA Viral Load (copies/mL)  Date Value  06/11/2023 70  01/25/2022 28,900   CD4 T Cell Abs (/uL)  Date Value  06/11/2023 378 (L)  10/31/2022 350  (L)  01/25/2022 332 (L)     Health Maintenance  Topic Date Due  . DTaP/Tdap/Td (2 - Td or Tdap) 06/11/2021  . Hepatitis B Vaccines 19-59 Average Risk (1 of 3 - Risk 3-dose series) Never done  . Influenza Vaccine  02/01/2024  . COVID-19 Vaccine (6 - 2025-26 season) 03/03/2024  . Lung Cancer Screening  12/24/2024  . Medicare Annual Wellness (AWV)  04/04/2025  . Pneumococcal Vaccine: 50+ Years (4 of 4 - PCV20 or PCV21) 11/15/2025  . Colonoscopy  01/17/2030  . Hepatitis C Screening  Completed  . HIV Screening  Completed  . Zoster Vaccines- Shingrix   Completed  . HPV VACCINES  Aged Out  . Meningococcal B Vaccine  Aged Out  . Mammogram  Discontinued      Review of Systems  Constitutional:  Negative for chills, fever and weight loss.  Respiratory:  Positive for wheezing. Negative for cough.   Cardiovascular:  Negative for chest pain.  Gastrointestinal:  Positive for constipation. Negative for blood in stool and diarrhea.  Genitourinary:  Negative for dysuria.    Please see HPI. All other systems reviewed and negative.     Objective:  Physical Exam Vitals reviewed.  Constitutional:      Appearance: Normal appearance. He is obese.  HENT:     Mouth/Throat:     Mouth: Mucous membranes are moist.     Pharynx: No oropharyngeal  exudate.  Eyes:     Extraocular Movements: Extraocular movements intact.     Pupils: Pupils are equal, round, and reactive to light.  Cardiovascular:     Rate and Rhythm: Normal rate and regular rhythm.  Pulmonary:     Effort: Pulmonary effort is normal.     Breath sounds: Normal breath sounds.  Abdominal:     General: Bowel sounds are normal. There is distension.     Palpations: Abdomen is soft.     Tenderness: There is no abdominal tenderness. There is no guarding.  Musculoskeletal:        General: Normal range of motion.     Cervical back: Normal range of motion and neck supple.     Right lower leg: No edema.     Left lower leg: No edema.   Neurological:     General: No focal deficit present.     Mental Status: He is alert.  Psychiatric:        Mood and Affect: Mood normal.           Assessment & Plan:

## 2024-04-11 LAB — COMPREHENSIVE METABOLIC PANEL WITH GFR
ALT: 21 IU/L (ref 0–44)
AST: 28 IU/L (ref 0–40)
Albumin: 4.3 g/dL (ref 3.8–4.9)
Alkaline Phosphatase: 111 IU/L (ref 47–123)
BUN/Creatinine Ratio: 6 — ABNORMAL LOW (ref 10–24)
BUN: 7 mg/dL — ABNORMAL LOW (ref 8–27)
Bilirubin Total: 0.4 mg/dL (ref 0.0–1.2)
CO2: 22 mmol/L (ref 20–29)
Calcium: 9.4 mg/dL (ref 8.6–10.2)
Chloride: 107 mmol/L — ABNORMAL HIGH (ref 96–106)
Creatinine, Ser: 1.27 mg/dL (ref 0.76–1.27)
Globulin, Total: 2.8 g/dL (ref 1.5–4.5)
Glucose: 95 mg/dL (ref 70–99)
Potassium: 3.9 mmol/L (ref 3.5–5.2)
Sodium: 143 mmol/L (ref 134–144)
Total Protein: 7.1 g/dL (ref 6.0–8.5)
eGFR: 65 mL/min/1.73 (ref >59)

## 2024-04-11 LAB — LIPID PANEL
Chol/HDL Ratio: 2.5 ratio (ref 0.0–5.0)
Cholesterol, Total: 130 mg/dL (ref 100–199)
HDL: 51 mg/dL (ref >39)
LDL Chol Calc (NIH): 57 mg/dL (ref 0–99)
Triglycerides: 126 mg/dL (ref 0–149)
VLDL Cholesterol Cal: 22 mg/dL (ref 5–40)

## 2024-04-11 LAB — CBC
Hematocrit: 39.2 % (ref 37.5–51.0)
Hemoglobin: 12.2 g/dL — ABNORMAL LOW (ref 13.0–17.7)
MCH: 27.5 pg (ref 26.6–33.0)
MCHC: 31.1 g/dL — ABNORMAL LOW (ref 31.5–35.7)
MCV: 89 fL (ref 79–97)
Platelets: 185 x10E3/uL (ref 150–450)
RBC: 4.43 x10E6/uL (ref 4.14–5.80)
RDW: 15.8 % — ABNORMAL HIGH (ref 11.6–15.4)
WBC: 5.6 x10E3/uL (ref 3.4–10.8)

## 2024-04-11 LAB — T-HELPER CELLS (CD4) COUNT (NOT AT ARMC)
CD4 % Helper T Cell: 28 % — ABNORMAL LOW (ref 33–65)
CD4 T Cell Abs: 333 /uL — ABNORMAL LOW (ref 400–1790)

## 2024-04-11 LAB — URINE CYTOLOGY ANCILLARY ONLY
Chlamydia: NEGATIVE
Comment: NEGATIVE
Comment: NORMAL
Neisseria Gonorrhea: NEGATIVE

## 2024-04-11 LAB — RPR: RPR Ser Ql: NONREACTIVE

## 2024-04-11 LAB — HIV-1 RNA QUANT-NO REFLEX-BLD
HIV-1 RNA Viral Load Log: 1.699 {Log_copies}/mL
HIV-1 RNA Viral Load: 50 {copies}/mL

## 2024-04-18 ENCOUNTER — Other Ambulatory Visit: Payer: Self-pay

## 2024-04-24 ENCOUNTER — Ambulatory Visit (HOSPITAL_BASED_OUTPATIENT_CLINIC_OR_DEPARTMENT_OTHER): Admitting: Pulmonary Disease

## 2024-05-02 ENCOUNTER — Telehealth: Payer: Self-pay | Admitting: *Deleted

## 2024-05-02 NOTE — Telephone Encounter (Signed)
 I talked to pt's sister who stated pt is out of town and needs to change his Cabenuva  appt. Appt re-scheduled to 11/13 @ 1445PM.

## 2024-05-02 NOTE — Telephone Encounter (Signed)
 Call from pt's sister April States pt is scheduled for a Cabenuva  injection on Wed 11/5, but he will be out of town working. Would like to r/s injection. CMA will have triage contact her back with an appt as CMA is unaware of timeframe to receive injection.SABRA

## 2024-05-03 ENCOUNTER — Other Ambulatory Visit: Payer: Self-pay | Admitting: Cardiology

## 2024-05-05 ENCOUNTER — Ambulatory Visit: Admitting: Cardiology

## 2024-05-07 ENCOUNTER — Ambulatory Visit

## 2024-05-12 ENCOUNTER — Other Ambulatory Visit: Payer: Self-pay

## 2024-05-12 NOTE — Progress Notes (Signed)
 Specialty Pharmacy Refill Coordination Note  AKIRA ADELSBERGER is a 63 y.o. male assessed today regarding refills of clinic administered specialty medication(s) Cabotegravir  & Rilpivirine  (Cabenuva )   Clinic requested Courier to Provider Office   Delivery date: 05/14/24   Verified address: [facility] [place],  [place] [address]   Medication will be filled on: 05/13/24  Spoke with Gaetana at [facility]  Copay: $0.00 Appointment: 11.14.25

## 2024-05-13 ENCOUNTER — Other Ambulatory Visit: Payer: Self-pay

## 2024-05-15 ENCOUNTER — Ambulatory Visit (INDEPENDENT_AMBULATORY_CARE_PROVIDER_SITE_OTHER): Admitting: *Deleted

## 2024-05-15 DIAGNOSIS — B20 Human immunodeficiency virus [HIV] disease: Secondary | ICD-10-CM

## 2024-05-15 MED ORDER — CABOTEGRAVIR & RILPIVIRINE ER 600 & 900 MG/3ML IM SUER
1.0000 | Freq: Once | INTRAMUSCULAR | Status: AC
  Administered 2024-05-15: 1 via INTRAMUSCULAR

## 2024-05-15 NOTE — Progress Notes (Signed)
    Patient presents for Cabenuva  injections   States feeling well - yes.   Target date is 9th of each month   Last injection received 03/06/2024.   Patient is within 7 day window before or after target date for injections   Unable to use VG sites 2/2 body habitus   Rilpivirine  900 mg Given IM LUOQ. Patient tolerated well.   Cabotegravir  600 given IM RUOQ. Patient tolerated well.   Patient waited 10 minutes in clinic following injections without incident.  Next injections are due 1/0/2026.   Casey Hartnett,RN

## 2024-05-21 ENCOUNTER — Other Ambulatory Visit: Payer: Self-pay | Admitting: Internal Medicine

## 2024-05-21 ENCOUNTER — Other Ambulatory Visit: Payer: Self-pay

## 2024-05-22 ENCOUNTER — Ambulatory Visit (INDEPENDENT_AMBULATORY_CARE_PROVIDER_SITE_OTHER): Admitting: Pulmonary Disease

## 2024-05-22 ENCOUNTER — Encounter (HOSPITAL_BASED_OUTPATIENT_CLINIC_OR_DEPARTMENT_OTHER): Payer: Self-pay | Admitting: Pulmonary Disease

## 2024-05-22 ENCOUNTER — Encounter: Payer: Self-pay | Admitting: Infectious Diseases

## 2024-05-22 VITALS — BP 135/88 | HR 86 | Ht 69.0 in | Wt 243.8 lb

## 2024-05-22 DIAGNOSIS — J441 Chronic obstructive pulmonary disease with (acute) exacerbation: Secondary | ICD-10-CM | POA: Diagnosis not present

## 2024-05-22 MED ORDER — PREDNISONE 10 MG PO TABS: ORAL_TABLET | ORAL | 0 refills | Status: AC

## 2024-05-22 NOTE — Progress Notes (Signed)
 Subjective:    Patient ID: Casey Brennan, male    DOB: Apr 20, 1961, 63 y.o.   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   HIV - diagnosed 80 ,last CD 4 479 03/2020 Viral meningits He smoked more than 30 pack years before he quit in 2012 -schatzki ring - dilated  -eosinophilic gastritis,     Discussed the use of AI scribe software for clinical note transcription with the patient, who gave verbal consent to proceed.  History of Present Illness Casey Brennan is a 63 year old male with COPD who presents with breathing difficulties and chest pain.  He experiences ongoing breathing difficulties, struggling to breathe even with inhalers. He uses Breztri  twice daily and albuterol  up to four times a day. He has gained weight since starting Cabenuva  for HIV, which he believes worsens his breathing issues. Wheezing and shortness of breath occur, especially after physical activities like mulching leaves. An albuterol  nebulizer is used during exacerbations.  He experienced a short run of ventricular tachycardia in May after an ablation procedure, lasting less than 30 seconds. Nitroglycerin  is used as needed for chest tightening. He takes metoprolol  for blood pressure management, necessary after neck surgeries that caused spinal cord compression and uncontrolled blood pressure. Occasional chest pain occurs, with the last episode last week.  He has a history of lung cancer screening, with the last test conducted in November of the previous year.  Weight gain attributed to Cabenuva , an HIV medication, with a 20-pound increase since starting the medication.  Ongoing symptoms of gastroparesis, including abdominal bloating and dysphagia, exacerbated post-second neck surgery. Currently managed with Motegrity  and  Movantik . - Continue Motegrity  and Movantik .     CD4 333  04/2024  CT cors neg PET CT 11/2023 neg  Significant tests/ events reviewed   10/2022 CT c/A/P >> unchanged nodule, 3 mm right upper lobe cystic bronchiectasis, unchanged -2 subcentimeter left iliac lytic lesions -unchanged 02/26/22 CTA chest >> RUL 4mm nodule, likely LN CT cors 04/2020 ca score 0     PFT 01/2023 moderate airway obstruction, ratio 59, FEV1 1.41/40%, FVC 51%, positive bronchodilator response, DLCO 81%   spirometry 07/22/2014  FEV1  1.12 (34%) ratio 59  AEC 900   Home sleep test 07/2021 mild, AHI 8/hour, low sat 88%, worse when supine  Review of Systems  neg for any significant sore throat, dysphagia, itching, sneezing, nasal congestion or excess/ purulent secretions, fever, chills, sweats, unintended wt loss, pleuritic or exertional cp, hempoptysis, orthopnea pnd or change in chronic leg swelling. Also denies presyncope, palpitations, heartburn, abdominal pain, nausea, vomiting, diarrhea or change in bowel or urinary habits, dysuria,hematuria, rash, arthralgias, visual complaints, headache, numbness weakness or ataxia.      Objective:   Physical Exam  Gen. Pleasant, obese, in no distress ENT - no lesions, no post nasal drip Neck: No JVD, no thyromegaly, no carotid bruits Lungs: no use of accessory muscles, no dullness to percussion, decreased without rales or rhonchi  Cardiovascular: Rhythm regular, heart sounds  normal, no murmurs or gallops, 1+ peripheral edema Musculoskeletal: No deformities, no cyanosis or clubbing , no tremors       Assessment & Plan:   Assessment and Plan Assessment & Plan Chronic obstructive pulmonary disease (COPD) with acute exacerbation COPD exacerbation  with increased wheezing and dyspnea, possibly due to recent exposure to dust and dirt. Weight gain and fluid retention may contribute to respiratory symptoms. Current medications include Breztri  and albuterol . Prednisone  is  considered but not preferred due to potential for further weight gain. - Continue Breztri  twice daily. - Use albuterol  up to four times daily, spaced at least six hours apart. - Prescribed prednisone  as a backup if symptoms worsen. - Monitor for fluid retention and adjust torsemide  as needed.   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 Cephalosporins are tolerated without adverse reactions. - Avoid penicillins and sulfonamides. - Use cephalosporins as an alternative antibiotic if needed.  Hypertension, controlled with intermittent fluid retention Hypertension previously uncontrolled due to spinal cord compression, now improved post-surgery. Intermittent fluid retention managed with torsemide  as needed. - Monitor blood pressure and fluid status. - Use torsemide  as needed for fluid retention.  - Continue to monitor weight and adjust management as needed.

## 2024-05-22 NOTE — Patient Instructions (Signed)
  VISIT SUMMARY: Today, we discussed your ongoing breathing difficulties and chest pain. We reviewed your COPD management, addressed your gastroparesis symptoms, and discussed your allergies, hypertension, weight gain, and HIV treatment.  YOUR PLAN: -CHRONIC OBSTRUCTIVE PULMONARY DISEASE (COPD) WITH ACUTE EXACERBATION: COPD is a chronic lung disease that makes it hard to breathe. You are experiencing an exacerbation, likely due to recent exposure to dust and dirt. Continue using Breztri  twice daily and albuterol  up to four times daily, spaced at least six hours apart. Prednisone  is prescribed as a backup if symptoms worsen. Monitor for fluid retention and adjust torsemide  as needed.   -ALLERGY TO PENICILLINS AND SULFONAMIDES WITH HISTORY OF ANAPHYLAXIS: You have severe allergies to penicillins and sulfonamides, which can cause life-threatening reactions. Avoid these medications and use cephalosporins as an alternative if needed.  -HYPERTENSION, CONTROLLED WITH INTERMITTENT FLUID RETENTION: Hypertension is high blood pressure. Your blood pressure is currently controlled, but you experience occasional fluid retention. Monitor your blood pressure and fluid status, and use torsemide  as needed for fluid retention.  -WEIGHT GAIN DUE TO HIV MEDICATION: You have gained weight since starting Cabenuva  for HIV. Continue to monitor your weight and adjust management as needed.    INSTRUCTIONS: Please follow up as needed for any worsening symptoms or concerns. Continue monitoring your weight, blood pressure, and fluid status. Use medications as prescribed and avoid allergens. If you experience any severe reactions or worsening symptoms, seek medical attention immediately.                      Contains text generated by Abridge.                                 Contains text generated by Abridge.

## 2024-05-23 ENCOUNTER — Other Ambulatory Visit: Payer: Self-pay

## 2024-05-23 ENCOUNTER — Telehealth: Payer: Self-pay | Admitting: *Deleted

## 2024-05-23 MED ORDER — BICTEGRAVIR-EMTRICITAB-TENOFOV 50-200-25 MG PO TABS: 1.0000 | ORAL_TABLET | Freq: Every day | ORAL | 3 refills | Status: AC

## 2024-05-23 NOTE — Telephone Encounter (Unsigned)
 Copied from CRM [id number]. Topic: Clinical - Prescription Issue >> May 23, 2024 10:44 AM Graeme ORN wrote: Reason for CRM: Patient sister called. Would like to request Dr Eben restart medication Biktarvy . Per chart - message sent to provider 11/20. Advised offices closes at 12 Friday - caller understood. Would like callback with update when available.

## 2024-05-23 NOTE — Progress Notes (Signed)
 Disenrolled - patient restarting biktarvy  and mdo sent to [facility].

## 2024-05-26 NOTE — Telephone Encounter (Signed)
 Spoke with sister, he will resume biktarvy , rx at his pharmacy

## 2024-06-15 ENCOUNTER — Other Ambulatory Visit: Payer: Self-pay | Admitting: Internal Medicine

## 2024-06-15 ENCOUNTER — Other Ambulatory Visit (HOSPITAL_BASED_OUTPATIENT_CLINIC_OR_DEPARTMENT_OTHER): Payer: Self-pay | Admitting: Pulmonary Disease

## 2024-06-15 DIAGNOSIS — J449 Chronic obstructive pulmonary disease, unspecified: Secondary | ICD-10-CM

## 2024-06-16 ENCOUNTER — Telehealth: Payer: Self-pay | Admitting: Cardiology

## 2024-06-16 ENCOUNTER — Ambulatory Visit: Payer: Self-pay

## 2024-06-16 ENCOUNTER — Ambulatory Visit (HOSPITAL_BASED_OUTPATIENT_CLINIC_OR_DEPARTMENT_OTHER): Payer: Self-pay | Admitting: Pulmonary Disease

## 2024-06-16 ENCOUNTER — Other Ambulatory Visit: Payer: Self-pay

## 2024-06-16 NOTE — Telephone Encounter (Signed)
 Pt triaged today.    Copied from CRM #[id number]. Topic: Clinical - Red Word Triage >> Jun 16, 2024  1:39 PM Ismael A wrote: Red Word that prompted transfer to Nurse Triage:  having still having sob and wheezing, especially with exertion,  since last visit with Dr. Jude - pt wanted to change to Trelegy inhaler

## 2024-06-16 NOTE — Telephone Encounter (Signed)
 I spoke with patient's sister.  She reports patient saw Dr Jude recently and it was recommended he get back in to see Dr Elmira.  Patient has appointment with Dr Elmira on 12/26. Sister reports patient has been wheezing and has shortness of breath.  Chest pain occurs when trying to get breath. Pain is worse when he moves and bends over.  Sister hears wheezing when patient moves and bends over. Swelling in his feet and ankles for last week.   Patient has appointment in pulmonary office tomorrow.  I advised sister to have patient keep this appointment as scheduled.  I told her we would send message to Dr Elmira and we would let her know if any changes recommended prior to upcoming appointment.

## 2024-06-16 NOTE — Telephone Encounter (Signed)
°*  STAT* If patient is at the pharmacy, call can be transferred to refill team.   1. Which medications need to be refilled? (please list name of each medication and dose if known)  torsemide  (DEMADEX ) 20 MG tablet   2. Would you like to learn more about the convenience, safety, & potential cost savings by using the [facility]? no   3. Are you open to using the [facility] (Type [facility]. no   4. Which pharmacy/location (including street and city if local pharmacy) is medication to be sent to? [id number] - [place], [place] - [address] AT [facility] [address]    5. Do they need a 30 day or 90 day supply? 90 day     Pt completely out.

## 2024-06-16 NOTE — Telephone Encounter (Signed)
 FYI Only or Action Required?: Action required by provider: request for appointment and update on patient condition.  Patient is followed in Pulmonology for COPD exacerbation, last seen on 05/22/2024 by Jude Harden GAILS, MD.  Called Nurse Triage reporting Shortness of Breath.  Symptoms began several months ago.  Interventions attempted: Prescription medications: Albuterol , Ventolin , Breztri  QVar .  Symptoms are: unchanged.  Triage Disposition: See HCP Within 4 Hours (Or PCP Triage)  Patient/caregiver understands and will follow disposition?:  FYI Only or Action Required?: Action required by provider: request for appointment and update on patient condition.  Patient was last seen in primary care on 04/09/2024 by Eben Reyes BROCKS, MD.  Called Nurse Triage reporting Shortness of Breath.  Symptoms began several months ago.  Interventions attempted: Prescription medications:  Albuterol , Ventolin , Breztri  QVar .  Symptoms are: unchanged.  Triage Disposition: See HCP Within 4 Hours (Or PCP Triage)  Patient/caregiver understands and will follow disposition?:  Reason for Disposition  [1] MILD difficulty breathing (e.g., minimal/no SOB at rest, SOB with walking, pulse < 100) AND [2] NEW-onset or WORSE than normal  Answer Assessment - Initial Assessment Questions Patient using albuterol  treatments 3 X a day and Ventolin  4X a day most days, which is high for him. They feel these meds aren't working and had previously talked to Dr Jude about changing to Trelegy instead. Patient hasn't worsened but he hasn't gotten better since seeing Dr Jude 3 weeks ago. No appointment available with Dr Jude till Feb 19. 1. RESPIRATORY STATUS: Describe your breathing? (e.g., wheezing, shortness of breath, unable to speak, severe coughing)      Wheezing,SOB when bent Ventolin ,Brestry, Qvar , Albuterol  2. ONSET: When did this breathing problem begin?      Months 3. PATTERN Does the difficult breathing come  and go, or has it been constant since it started?      Pretty constant 5. RECURRENT SYMPTOM: Have you had difficulty breathing before? If Yes, ask: When was the last time? and What happened that time?      Yes, saw Dr Jude for this 3 weeks, 6. CARDIAC HISTORY: Do you have any history of heart disease? (e.g., heart attack, angina, bypass surgery, angioplasty)      HTN, V Tach 7. LUNG HISTORY: Do you have any history of lung disease?  (e.g., pulmonary embolus, asthma, emphysema)     COPD Pulmonary nodule 9. OTHER SYMPTOMS: Do you have any other symptoms? (e.g., chest pain, cough, dizziness, fever, runny nose)     Has occassional chest pain that he talked to Dr Jude about that happens when he feels like he can't breath. 10. O2 SATURATION MONITOR:  Do you use an oxygen  saturation monitor (pulse oximeter) at home? If Yes, ask: What is your reading (oxygen  level) today? What is your usual oxygen  saturation reading? (e.g., 95%)       Unable to check, patient is leaving the house.  Protocols used: Breathing Difficulty-A-AH

## 2024-06-16 NOTE — Telephone Encounter (Signed)
 Pt c/o Shortness Of Breath: STAT if SOB developed within the last 24 hours or pt is noticeably SOB on the phone  1. Are you currently SOB (can you hear that pt is SOB on the phone)? np  2. How long have you been experiencing SOB? Over a month  3. Are you SOB when sitting or when up moving around? Both   4. Are you currently experiencing any other symptoms?  Chest pain that comes and goes. Please advise.

## 2024-06-17 ENCOUNTER — Ambulatory Visit (HOSPITAL_BASED_OUTPATIENT_CLINIC_OR_DEPARTMENT_OTHER)

## 2024-06-17 NOTE — Telephone Encounter (Signed)
 Normal coronaries previously. Will discuss further during office visit on 12/26.  Thanks MJP

## 2024-06-17 NOTE — Telephone Encounter (Signed)
 Please send 30 days X 1 refill to get him through till appt with me on 12/26.  Thanks MJP

## 2024-06-17 NOTE — Telephone Encounter (Signed)
 Pt has appt on 06/19/2024

## 2024-06-18 MED ORDER — TORSEMIDE 20 MG PO TABS: 10.0000 mg | ORAL_TABLET | ORAL | 1 refills | Status: DC

## 2024-06-18 NOTE — Telephone Encounter (Signed)
 Refill sent, per Dr. Elmira.

## 2024-06-19 ENCOUNTER — Other Ambulatory Visit: Payer: Self-pay

## 2024-06-19 ENCOUNTER — Observation Stay (HOSPITAL_COMMUNITY)
Admission: EM | Admit: 2024-06-19 | Discharge: 2024-06-21 | Disposition: A | Attending: Emergency Medicine | Admitting: Emergency Medicine

## 2024-06-19 ENCOUNTER — Ambulatory Visit (HOSPITAL_BASED_OUTPATIENT_CLINIC_OR_DEPARTMENT_OTHER): Admitting: Pulmonary Disease

## 2024-06-19 ENCOUNTER — Encounter (HOSPITAL_COMMUNITY): Payer: Self-pay

## 2024-06-19 ENCOUNTER — Encounter (HOSPITAL_BASED_OUTPATIENT_CLINIC_OR_DEPARTMENT_OTHER): Payer: Self-pay | Admitting: Pulmonary Disease

## 2024-06-19 ENCOUNTER — Emergency Department (HOSPITAL_COMMUNITY)

## 2024-06-19 VITALS — BP 134/84 | HR 86 | Ht 69.0 in | Wt 249.1 lb

## 2024-06-19 DIAGNOSIS — Z79899 Other long term (current) drug therapy: Secondary | ICD-10-CM | POA: Diagnosis not present

## 2024-06-19 DIAGNOSIS — Z8546 Personal history of malignant neoplasm of prostate: Secondary | ICD-10-CM | POA: Diagnosis not present

## 2024-06-19 DIAGNOSIS — G44229 Chronic tension-type headache, not intractable: Secondary | ICD-10-CM

## 2024-06-19 DIAGNOSIS — Z96651 Presence of right artificial knee joint: Secondary | ICD-10-CM | POA: Diagnosis not present

## 2024-06-19 DIAGNOSIS — E785 Hyperlipidemia, unspecified: Secondary | ICD-10-CM | POA: Diagnosis not present

## 2024-06-19 DIAGNOSIS — M899 Disorder of bone, unspecified: Secondary | ICD-10-CM | POA: Diagnosis not present

## 2024-06-19 DIAGNOSIS — Z8679 Personal history of other diseases of the circulatory system: Secondary | ICD-10-CM

## 2024-06-19 DIAGNOSIS — G959 Disease of spinal cord, unspecified: Secondary | ICD-10-CM | POA: Insufficient documentation

## 2024-06-19 DIAGNOSIS — E669 Obesity, unspecified: Secondary | ICD-10-CM | POA: Diagnosis not present

## 2024-06-19 DIAGNOSIS — Z87891 Personal history of nicotine dependence: Secondary | ICD-10-CM | POA: Diagnosis not present

## 2024-06-19 DIAGNOSIS — I5033 Acute on chronic diastolic (congestive) heart failure: Secondary | ICD-10-CM | POA: Diagnosis not present

## 2024-06-19 DIAGNOSIS — I119 Hypertensive heart disease without heart failure: Secondary | ICD-10-CM

## 2024-06-19 DIAGNOSIS — Z21 Asymptomatic human immunodeficiency virus [HIV] infection status: Secondary | ICD-10-CM | POA: Diagnosis not present

## 2024-06-19 DIAGNOSIS — J441 Chronic obstructive pulmonary disease with (acute) exacerbation: Secondary | ICD-10-CM | POA: Diagnosis not present

## 2024-06-19 DIAGNOSIS — R1909 Other intra-abdominal and pelvic swelling, mass and lump: Secondary | ICD-10-CM | POA: Diagnosis present

## 2024-06-19 DIAGNOSIS — R601 Generalized edema: Secondary | ICD-10-CM | POA: Diagnosis not present

## 2024-06-19 DIAGNOSIS — I130 Hypertensive heart and chronic kidney disease with heart failure and stage 1 through stage 4 chronic kidney disease, or unspecified chronic kidney disease: Secondary | ICD-10-CM | POA: Diagnosis not present

## 2024-06-19 DIAGNOSIS — N2889 Other specified disorders of kidney and ureter: Secondary | ICD-10-CM | POA: Diagnosis present

## 2024-06-19 DIAGNOSIS — C7951 Secondary malignant neoplasm of bone: Secondary | ICD-10-CM | POA: Diagnosis not present

## 2024-06-19 DIAGNOSIS — N182 Chronic kidney disease, stage 2 (mild): Secondary | ICD-10-CM | POA: Diagnosis not present

## 2024-06-19 DIAGNOSIS — R252 Cramp and spasm: Secondary | ICD-10-CM

## 2024-06-19 DIAGNOSIS — G250 Essential tremor: Secondary | ICD-10-CM

## 2024-06-19 DIAGNOSIS — B20 Human immunodeficiency virus [HIV] disease: Secondary | ICD-10-CM | POA: Diagnosis not present

## 2024-06-19 LAB — CBC WITH DIFFERENTIAL/PLATELET
Abs Immature Granulocytes: 0.01 K/uL (ref 0.00–0.07)
Basophils Absolute: 0.1 K/uL (ref 0.0–0.1)
Basophils Relative: 1 %
Eosinophils Absolute: 0.4 K/uL (ref 0.0–0.5)
Eosinophils Relative: 7 %
HCT: 32.5 % — ABNORMAL LOW (ref 39.0–52.0)
Hemoglobin: 10.1 g/dL — ABNORMAL LOW (ref 13.0–17.0)
Immature Granulocytes: 0 %
Lymphocytes Relative: 36 %
Lymphs Abs: 2.1 K/uL (ref 0.7–4.0)
MCH: 29.5 pg (ref 26.0–34.0)
MCHC: 31.1 g/dL (ref 30.0–36.0)
MCV: 95 fL (ref 80.0–100.0)
Monocytes Absolute: 0.5 K/uL (ref 0.1–1.0)
Monocytes Relative: 8 %
Neutro Abs: 2.8 K/uL (ref 1.7–7.7)
Neutrophils Relative %: 48 %
Platelets: 176 K/uL (ref 150–400)
RBC: 3.42 MIL/uL — ABNORMAL LOW (ref 4.22–5.81)
RDW: 20.9 % — ABNORMAL HIGH (ref 11.5–15.5)
WBC: 5.9 K/uL (ref 4.0–10.5)
nRBC: 0 % (ref 0.0–0.2)

## 2024-06-19 LAB — URINALYSIS, ROUTINE W REFLEX MICROSCOPIC
Bilirubin Urine: NEGATIVE
Glucose, UA: NEGATIVE mg/dL
Hgb urine dipstick: NEGATIVE
Ketones, ur: NEGATIVE mg/dL
Leukocytes,Ua: NEGATIVE
Nitrite: POSITIVE — AB
Protein, ur: 100 mg/dL — AB
Specific Gravity, Urine: 1.009 (ref 1.005–1.030)
pH: 7 (ref 5.0–8.0)

## 2024-06-19 LAB — COMPREHENSIVE METABOLIC PANEL WITH GFR
ALT: 22 U/L (ref 0–44)
AST: 31 U/L (ref 15–41)
Albumin: 4.1 g/dL (ref 3.5–5.0)
Alkaline Phosphatase: 118 U/L (ref 38–126)
Anion gap: 7 (ref 5–15)
BUN: 8 mg/dL (ref 8–23)
CO2: 28 mmol/L (ref 22–32)
Calcium: 8.9 mg/dL (ref 8.9–10.3)
Chloride: 105 mmol/L (ref 98–111)
Creatinine, Ser: 1.44 mg/dL — ABNORMAL HIGH (ref 0.61–1.24)
GFR, Estimated: 55 mL/min — ABNORMAL LOW (ref >60)
Glucose, Bld: 118 mg/dL — ABNORMAL HIGH (ref 70–99)
Potassium: 4.2 mmol/L (ref 3.5–5.1)
Sodium: 140 mmol/L (ref 135–145)
Total Bilirubin: 1.1 mg/dL (ref 0.0–1.2)
Total Protein: 6.6 g/dL (ref 6.5–8.1)

## 2024-06-19 LAB — PRO BRAIN NATRIURETIC PEPTIDE: Pro Brain Natriuretic Peptide: 70.4 pg/mL (ref <300.0)

## 2024-06-19 LAB — TROPONIN T, HIGH SENSITIVITY: Troponin T High Sensitivity: 18 ng/L (ref 0–19)

## 2024-06-19 MED ORDER — TRAMADOL HCL 50 MG PO TABS: 50.0000 mg | ORAL_TABLET | Freq: Four times a day (QID) | ORAL | Status: DC | PRN

## 2024-06-19 MED ORDER — IOHEXOL 300 MG/ML  SOLN
100.0000 mL | Freq: Once | INTRAMUSCULAR | Status: AC | PRN
  Administered 2024-06-19: 18:00:00 100 mL via INTRAVENOUS

## 2024-06-19 MED ORDER — MONTELUKAST SODIUM 10 MG PO TABS
10.0000 mg | ORAL_TABLET | Freq: Every day | ORAL | Status: DC
  Administered 2024-06-19 – 2024-06-21 (×3): 10 mg via ORAL
  Filled 2024-06-19 (×3): qty 1

## 2024-06-19 MED ORDER — GABAPENTIN 300 MG PO CAPS
600.0000 mg | ORAL_CAPSULE | Freq: Every day | ORAL | Status: DC
  Administered 2024-06-19 – 2024-06-20 (×2): 600 mg via ORAL
  Filled 2024-06-19 (×2): qty 2

## 2024-06-19 MED ORDER — TIZANIDINE HCL 4 MG PO TABS
6.0000 mg | ORAL_TABLET | Freq: Three times a day (TID) | ORAL | Status: DC
  Administered 2024-06-19 – 2024-06-21 (×5): 6 mg via ORAL
  Filled 2024-06-19 (×11): qty 2

## 2024-06-19 MED ORDER — SODIUM CHLORIDE 0.9% FLUSH
3.0000 mL | Freq: Two times a day (BID) | INTRAVENOUS | Status: DC
  Administered 2024-06-19 – 2024-06-21 (×4): 3 mL via INTRAVENOUS

## 2024-06-19 MED ORDER — METOPROLOL SUCCINATE ER 50 MG PO TB24
100.0000 mg | ORAL_TABLET | Freq: Every day | ORAL | Status: DC
  Administered 2024-06-20 – 2024-06-21 (×2): 100 mg via ORAL
  Filled 2024-06-19 (×2): qty 2

## 2024-06-19 MED ORDER — ENOXAPARIN SODIUM 60 MG/0.6ML IJ SOSY
55.0000 mg | PREFILLED_SYRINGE | INTRAMUSCULAR | Status: DC
  Filled 2024-06-19: qty 0.6

## 2024-06-19 MED ORDER — BICTEGRAVIR-EMTRICITAB-TENOFOV 50-200-25 MG PO TABS
1.0000 | ORAL_TABLET | Freq: Every day | ORAL | Status: DC
  Administered 2024-06-20 – 2024-06-21 (×2): 1 via ORAL
  Filled 2024-06-19 (×2): qty 1

## 2024-06-19 MED ORDER — ONDANSETRON HCL 4 MG/2ML IJ SOLN: 4.0000 mg | Freq: Four times a day (QID) | INTRAMUSCULAR | Status: DC | PRN

## 2024-06-19 MED ORDER — GABAPENTIN 300 MG PO CAPS
300.0000 mg | ORAL_CAPSULE | Freq: Every day | ORAL | Status: DC
  Administered 2024-06-20 – 2024-06-21 (×2): 300 mg via ORAL
  Filled 2024-06-19 (×2): qty 1

## 2024-06-19 MED ORDER — ALPRAZOLAM 0.25 MG PO TABS: 0.2500 mg | ORAL_TABLET | Freq: Two times a day (BID) | ORAL | Status: DC | PRN

## 2024-06-19 MED ORDER — ISOSORBIDE DINITRATE 20 MG PO TABS
20.0000 mg | ORAL_TABLET | Freq: Three times a day (TID) | ORAL | Status: DC
  Administered 2024-06-19 – 2024-06-21 (×5): 20 mg via ORAL
  Filled 2024-06-19 (×6): qty 1

## 2024-06-19 MED ORDER — HYDROMORPHONE HCL 1 MG/ML IJ SOLN
0.5000 mg | INTRAMUSCULAR | Status: DC | PRN
  Administered 2024-06-19 – 2024-06-20 (×2): 0.5 mg via INTRAVENOUS
  Filled 2024-06-19: qty 1
  Filled 2024-06-19 (×3): qty 0.5

## 2024-06-19 MED ORDER — ONDANSETRON HCL 4 MG PO TABS: 4.0000 mg | ORAL_TABLET | Freq: Four times a day (QID) | ORAL | Status: DC | PRN

## 2024-06-19 MED ORDER — ROSUVASTATIN CALCIUM 20 MG PO TABS
20.0000 mg | ORAL_TABLET | Freq: Every day | ORAL | Status: DC
  Administered 2024-06-20 – 2024-06-21 (×2): 20 mg via ORAL
  Filled 2024-06-19 (×2): qty 1

## 2024-06-19 MED ORDER — CEFUROXIME AXETIL 250 MG PO TABS
250.0000 mg | ORAL_TABLET | Freq: Two times a day (BID) | ORAL | Status: DC
  Administered 2024-06-20: 250 mg via ORAL
  Filled 2024-06-19: qty 1

## 2024-06-19 MED ORDER — FAMOTIDINE 20 MG PO TABS
40.0000 mg | ORAL_TABLET | Freq: Every day | ORAL | Status: DC
  Administered 2024-06-19 – 2024-06-20 (×2): 40 mg via ORAL
  Filled 2024-06-19 (×2): qty 2

## 2024-06-19 MED ORDER — POLYETHYLENE GLYCOL 3350 17 G PO PACK: 17.0000 g | PACK | Freq: Every day | ORAL | Status: DC | PRN

## 2024-06-19 MED ORDER — FUROSEMIDE 10 MG/ML IJ SOLN
40.0000 mg | Freq: Once | INTRAMUSCULAR | Status: AC
  Administered 2024-06-19: 18:00:00 40 mg via INTRAVENOUS
  Filled 2024-06-19: qty 4

## 2024-06-19 MED ORDER — HYDROXYZINE HCL 25 MG PO TABS
50.0000 mg | ORAL_TABLET | Freq: Two times a day (BID) | ORAL | Status: DC
  Administered 2024-06-19 – 2024-06-21 (×4): 50 mg via ORAL
  Filled 2024-06-19 (×4): qty 2

## 2024-06-19 MED ORDER — PANTOPRAZOLE SODIUM 40 MG PO TBEC
40.0000 mg | DELAYED_RELEASE_TABLET | Freq: Two times a day (BID) | ORAL | Status: DC
  Administered 2024-06-20 – 2024-06-21 (×3): 40 mg via ORAL
  Filled 2024-06-19 (×3): qty 1

## 2024-06-19 MED ORDER — BUDESON-GLYCOPYRROL-FORMOTEROL 160-9-4.8 MCG/ACT IN AERO
2.0000 | INHALATION_SPRAY | Freq: Two times a day (BID) | RESPIRATORY_TRACT | Status: DC
  Administered 2024-06-19 – 2024-06-21 (×4): 2 via RESPIRATORY_TRACT
  Filled 2024-06-19: qty 5.9

## 2024-06-19 MED ORDER — FUROSEMIDE 10 MG/ML IJ SOLN
40.0000 mg | Freq: Two times a day (BID) | INTRAMUSCULAR | Status: DC
  Administered 2024-06-20: 40 mg via INTRAVENOUS
  Filled 2024-06-19: qty 4

## 2024-06-19 MED ORDER — IPRATROPIUM-ALBUTEROL 0.5-2.5 (3) MG/3ML IN SOLN: 3.0000 mL | RESPIRATORY_TRACT | Status: DC | PRN

## 2024-06-19 MED ORDER — PREDNISONE 20 MG PO TABS
40.0000 mg | ORAL_TABLET | Freq: Every day | ORAL | Status: DC
  Administered 2024-06-20 – 2024-06-21 (×2): 40 mg via ORAL
  Filled 2024-06-19 (×2): qty 2

## 2024-06-19 NOTE — ED Triage Notes (Signed)
 Swelling in entire scrotum since Monday. Intermittent pain when moving. Urinary frequency, denies dysuria. Bilateral flank pain.  Hx of prostate cancer, in remission since 2016. Pt just left pulmonologist and was prescribed omnicef and prednisone  for asthma exacerbation.

## 2024-06-19 NOTE — ED Provider Notes (Signed)
 [place] [facility]MENT AT [facility] Provider Note   CSN: [id number] Arrival date & time: 06/19/24  1309     Patient presents with: Groin Swelling   Casey Brennan is a 63 y.o. male.   Pt is a 63y/o male with hx of COPD, hypertension, cardiomyopathy on torsemide , GERD, chronic renal insufficiency, HIV and prostate CA on immunotherapy who is presenting today with complaints of worsening swelling and some shortness of breath.  Symptoms have been worsening over several weeks.  He has had between a 10 to 15 pound weight gain in the last 2 weeks and now is reporting swelling in his legs, abdomen and scrotum.  He is having dyspnea on exertion with some mild orthopnea.  He did follow-up with his pulmonologist today and reported he had been having some yellow sputum and cough as well.  They felt that he may be having a COPD exacerbation and prescribed him antibiotics and steroids however he was also very concerned about the fluid.  He started taking 20 mg of torsemide  daily approximately 2 weeks ago reports he is urinating quite a bit but the swelling is only getting worse.  He has not had any chest pain.  He denies any nausea or vomiting.   The history is provided by the patient, the spouse and medical records.       Prior to Admission medications  Medication Sig Start Date End Date Taking? Authorizing Provider  albuterol  (PROVENTIL ) (2.5 MG/3ML) 0.083% nebulizer solution USE 1 VIAL VIA NEBULIZER EVERY 6 HOURS AS NEEDED FOR WHEEZING OR SHORTNESS OF BREATH 10/25/23   Norleen Lynwood ORN, MD  ALPRAZolam  (XANAX ) 0.5 MG tablet TAKE 1/2 TABLET(0.25 MG) BY MOUTH TWICE DAILY AS NEEDED FOR SLEEP 03/25/24   Dohmeier, Dedra, MD  azelastine  (ASTELIN ) 0.1 % nasal spray Place 2 sprays into both nostrils in the morning. Use in each nostril as directed    [provider]  beclomethasone (QVAR  REDIHALER) 40 MCG/ACT inhaler Inhale 2 puffs into the lungs 2 (two) times daily. 01/02/24   Hope Almarie ORN, NP  bictegravir-emtricitabine -tenofovir  AF (BIKTARVY ) 50-200-25 MG TABS tablet Take 1 tablet by mouth daily. 05/23/24 05/23/25  Eben Reyes BROCKS, MD  budesonide -glycopyrrolate -formoterol  (BREZTRI  AEROSPHERE) 160-9-4.8 MCG/ACT AERO inhaler INHALE 2 PUFFS INTO THE LUNGS IN THE MORNING AND AT BEDTIME 06/16/24   Jude Harden GAILS, MD  cefdinir (OMNICEF) 300 MG capsule Take 1 capsule (300 mg total) by mouth 2 (two) times daily. 06/19/24   Jude Harden GAILS, MD  cetirizine  (ZYRTEC ) 10 MG tablet TAKE 1 TABLET(10 MG) BY MOUTH DAILY 01/21/24   Norleen Lynwood ORN, MD  Cholecalciferol  (VITAMIN D3) 250 MCG (10000 UT) capsule Take 1 tablet by mouth daily. [employer] 04/14/21   [provider]  ciprofloxacin  (CIPRO ) 500 MG tablet Take 500 mg by mouth 2 (two) times daily. Patient not taking: Reported on 06/19/2024 01/10/24   [provider]  cyclobenzaprine  (FLEXERIL ) 5 MG tablet Take 5-10 mg by mouth at bedtime as needed. 12/05/23   [provider]  diltiazem  (CARDIZEM ) 30 MG tablet Take 1 tablet (30 mg total) by mouth 4 (four) times daily as needed (for heart racing/palpitations). 10/17/23   Patwardhan, Newman PARAS, MD  diphenhydrAMINE  (BENADRYL ) 25 MG tablet Take 1 tablet (25 mg total) by mouth every 6 (six) hours as needed. 04/11/21   Venter, Margaux, PA-C  dorzolamide  (TRUSOPT ) 2 % ophthalmic solution Place 1 drop into both eyes 2 (two) times daily. 06/07/22   [provider]  EPINEPHrine  0.3 mg/0.3 mL IJ SOAJ injection INJECT 1 PEN IN THE MUSCLE ONE TIME AS DIRECTED AS NEEDED FOR ANAPHYLAXIS 10/03/23   Norleen Lynwood ORN, MD  famotidine  (PEPCID ) 40 MG tablet Take 1 tablet (40 mg total) by mouth at bedtime. 01/25/24   Pyrtle, Gordy HERO, MD  gabapentin  (NEURONTIN ) 300 MG capsule Use one in AM and 2 at bedtime for neuralgia 11/22/23   Dohmeier, Dedra, MD  Glucos-Chond-Hyal Ac-Ca Fructo (MOVE FREE JOINT HEALTH ADVANCE PO) Take 1 tablet by mouth daily.    [provider]   HYDROcodone -acetaminophen  (NORCO/VICODIN) 5-325 MG tablet 1-2 tabs PO q6 hours prn pain 08/09/23   Kuzma, Kevin, MD  hydrOXYzine  (ATARAX ) 50 MG tablet TAKE 1 TABLET(50 MG) BY MOUTH TWICE DAILY 12/07/23   Patwardhan, Newman PARAS, MD  isosorbide  dinitrate (ISORDIL ) 20 MG tablet Take 1 tablet (20 mg total) by mouth 3 (three) times daily. 05/06/24   Patwardhan, Newman PARAS, MD  meclizine  (ANTIVERT ) 25 MG tablet TAKE 1 TABLET(25 MG) BY MOUTH DAILY AS NEEDED 05/21/24   Norleen Lynwood ORN, MD  methocarbamol  (ROBAXIN ) 500 MG tablet Take 1-2 tablets (500-1,000 mg total) by mouth every 6 (six) hours as needed for muscle spasms. 12/01/22   Beuford Anes, MD  metoprolol  succinate (TOPROL -XL) 100 MG 24 hr tablet Take 1 tablet (100 mg total) by mouth daily. Take with or immediately following a meal. 10/01/23   Patwardhan, Manish J, MD  montelukast  (SINGULAIR ) 10 MG tablet Take 10 mg by mouth daily. 11/15/20   [provider]  MOTEGRITY  2 MG TABS Take 1 tablet (2 mg total) by mouth daily. PHARMACY-PLEASE PROVIDE NAME BRAND MOTEGRITY  AS THIS IS COVERED BY PT INSURANCE. THANK YOU 02/04/24   Pyrtle, Gordy HERO, MD  Multiple Vitamin (MULTIVITAMIN WITH MINERALS) TABS tablet Take 1 tablet by mouth daily after breakfast.     [provider]  mupirocin  ointment (BACTROBAN ) 2 % APPLY EXTERNALLY TO THE AFFECTED AREA TWICE DAILY FOR 3 WEEKS, PLACE COTTON BETWEEN TOES TO ALLOW BETTER AERATION AS NEEDED Patient not taking: Reported on 06/19/2024 04/20/21   Eben Reyes BROCKS, MD  naloxegol  oxalate (MOVANTIK ) 25 MG TABS tablet TAKE 1 TABLET(25 MG) BY MOUTH DAILY 03/20/24   Pyrtle, Gordy HERO, MD  nitroGLYCERIN  (NITROSTAT ) 0.4 MG SL tablet Place 1 tablet (0.4 mg total) under the tongue every 5 (five) minutes as needed for chest pain. 02/08/22 06/19/24  Patwardhan, Newman PARAS, MD  ondansetron  (ZOFRAN -ODT) 4 MG disintegrating tablet Take 1 tablet (4 mg total) by mouth every 8 (eight) hours as needed for nausea or vomiting. 03/13/24   Norleen Lynwood ORN,  MD  OVER THE COUNTER MEDICATION Take 2 capsules by mouth daily. Zhou: thyroid  supplement    [provider]  pantoprazole  (PROTONIX ) 40 MG tablet TAKE 1 TABLET(40 MG) BY MOUTH TWICE DAILY BEFORE A MEAL 01/09/24   Pyrtle, Gordy HERO, MD  predniSONE  (DELTASONE ) 10 MG tablet Take 2 tabs daily with food x 5ds, then 1 tab daily with food x 5ds then STOP Patient not taking: Reported on 06/19/2024 05/22/24   Jude Harden GAILS, MD  QNASL  80 MCG/ACT AERS USE 2 SPRAYS IN EACH NOSTRIL DAILY 11/16/22   Norleen Lynwood ORN, MD  QULIPTA  30 MG TABS Prn for migraine Patient not taking: Reported on 06/19/2024 11/22/23   Dohmeier, Dedra, MD  rosuvastatin  (CRESTOR ) 20 MG tablet TAKE 1 TABLET(20 MG) BY MOUTH DAILY 08/15/23   Norleen Lynwood ORN, MD  tizanidine  (ZANAFLEX ) 6 MG capsule TAKE 1 CAPSULE(6 MG) BY MOUTH  THREE TIMES DAILY 06/16/24   Norleen Lynwood ORN, MD  torsemide  (DEMADEX ) 20 MG tablet Take 0.5 tablets (10 mg total) by mouth See admin instructions. Tuesday Thursday Saturday 06/18/24   Elmira Newman PARAS, MD  Ubrogepant  (UBRELVY ) 100 MG TABS Take 1 tablet (100 mg total) by mouth as needed (take 1 tablet at onset of migraine. may take an additional tablet 2 hrs later if needed (Do not exceed more than 2 tablets in 24 hour period)). 04/03/24   Dohmeier, Dedra, MD  VENTOLIN  HFA 108 (90 Base) MCG/ACT inhaler INHALE 2 PUFFS INTO THE LUNGS EVERY 6 HOURS AS NEEDED FOR WHEEZING OR SHORTNESS OF BREATH 06/16/24   Norleen Lynwood ORN, MD  zolpidem  (AMBIEN ) 5 MG tablet Take 1-2 tablets (5-10 mg total) by mouth at bedtime as needed for sleep. Patient not taking: Reported on 04/23/2019 12/21/16 05/19/19  Prentiss Prentice SAILOR, DO    Allergies: Amoxapine, Chocolate, Ciprofloxacin , Descovy  [emtricitabine -tenofovir  af], Doxycycline  hyclate, Genvoya  [elviteg-cobic-emtricit-tenofaf], Milk-related compounds, Morphine  and codeine, Oxycodone -acetaminophen , Penicillins, Sulfa antibiotics, Tomato, Vancomycin , Amlodipine , Carvedilol , Darunavir , Meloxicam ,  Acetaminophen , Flagyl  [metronidazole ], Hydralazine , Reglan  [metoclopramide ], Toradol  [ketorolac  tromethamine ], and Tramadol     Review of Systems  Updated Vital Signs BP (!) 171/104 (BP Location: Right Arm)   Pulse 78   Temp 98.3 F (36.8 C) (Oral)   Resp 16   Ht 5' 9 (1.753 m)   Wt 112.9 kg   SpO2 94%   BMI 36.76 kg/m   Physical Exam Vitals and nursing note reviewed.  Constitutional:      General: He is not in acute distress.    Appearance: He is well-developed.  HENT:     Head: Normocephalic and atraumatic.  Eyes:     Conjunctiva/sclera: Conjunctivae normal.     Pupils: Pupils are equal, round, and reactive to light.  Cardiovascular:     Rate and Rhythm: Normal rate and regular rhythm.     Pulses: Normal pulses.     Heart sounds: Murmur heard.  Pulmonary:     Effort: Pulmonary effort is normal. No respiratory distress.     Breath sounds: Examination of the right-lower field reveals decreased breath sounds. Examination of the left-lower field reveals decreased breath sounds. Decreased breath sounds present. No wheezing or rales.  Abdominal:     General: There is distension.     Palpations: Abdomen is soft.     Tenderness: There is no abdominal tenderness. There is no guarding or rebound.  Genitourinary:    Comments: Scrotal swelling Musculoskeletal:        General: No tenderness. Normal range of motion.     Cervical back: Normal range of motion and neck supple.     Right lower leg: Edema present.     Left lower leg: Edema present.     Comments: 2-3+ pitting edema up to the knee bilaterally  Skin:    General: Skin is warm and dry.     Findings: No erythema or rash.  Neurological:     Mental Status: He is alert and oriented to person, place, and time.  Psychiatric:        Behavior: Behavior normal.     (all labs ordered are listed, but only abnormal results are displayed) Labs Reviewed  CBC WITH DIFFERENTIAL/PLATELET  COMPREHENSIVE METABOLIC PANEL WITH GFR   PRO BRAIN NATRIURETIC PEPTIDE  TROPONIN T, HIGH SENSITIVITY    EKG: None  Radiology: No results found.   Procedures   Medications Ordered in the ED - No data to display  Medical Decision Making Amount and/or Complexity of Data Reviewed Labs: ordered. Radiology: ordered.   Pt with multiple medical problems and comorbidities and presenting today with a complaint that caries a high risk for morbidity and mortality.  Here today with the above complaints.  Concern for CHF exacerbation, most recent echo in April of this year showed an EF of 60 to 65% within normal coronary CT.  Also concern for possible anemia, AKI.  Patient has had some productive cough but lower suspicion for pneumonia as patient has not had fever, chills.       Final diagnoses:  None    ED Discharge Orders     None          Doretha Folks, MD 06/19/24 1512

## 2024-06-19 NOTE — Telephone Encounter (Signed)
 Left message to call office.

## 2024-06-19 NOTE — Progress Notes (Signed)
 Subjective:    Patient ID: Casey Brennan, male    DOB: 12/21/1960, 63 y.o.   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  History of Present Illness Casey Brennan is a 63 year old male with COPD who presents with increasing shortness of breath and nebulizer usage.  He has worsening shortness of breath with increased nebulizer use. He notes nocturnal wheezing and thick yellow mucus. Symptoms have not improved since his November visit despite antibiotics.  He had an episode of restricted breathing at a barbershop, worse with bending or exertion. He also notes swelling.  He takes torsemide  20 mg as needed for fluid, and has not started prednisone . He is allergic to penicillins, ciprofloxacin , and sulfa drugs, but tolerates Keflex .  He is on Biktarvy  for HIV after a recent switch from Cabenuva , with weight gain from 189 to 198 pounds that he believes worsens his breathing.  He has no fever. He has shortness of breath, wheezing, and swelling, and the only recent medication change is the switch to Biktarvy .   Weight gain attributed to Cabenuva          Significant tests/ events reviewed   PET CT 11/2023 neg     10/2022 CT c/A/P >> unchanged nodule, 3 mm right upper lobe cystic bronchiectasis, unchanged -2 subcentimeter left iliac lytic lesions -unchanged 02/26/22 CTA chest >> RUL 4mm nodule, likely LN CT cors 04/2020 ca score 0     PFT 01/2023 moderate airway obstruction, ratio 59,  FEV1 1.41/40%, FVC 51%, positive bronchodilator response, DLCO 81%   spirometry 07/22/2014  FEV1  1.12 (34%) ratio 59  AEC 900   Home sleep test 07/2021 mild, AHI 8/hour, low sat 88%, worse when supine   Review of Systems  neg for any significant sore throat, dysphagia, itching, sneezing, nasal congestion or excess/ purulent secretions, fever, chills, sweats, unintended wt loss, pleuritic or exertional cp, hempoptysis, orthopnea pnd or change in chronic leg swelling. Also denies presyncope, palpitations, heartburn, abdominal pain, nausea, vomiting, diarrhea or change in bowel or urinary habits, dysuria,hematuria, rash, arthralgias, visual complaints, headache, numbness weakness or ataxia.      Objective:   Physical Exam  Gen. Pleasant, obese, in no distress ENT - no lesions, no post nasal drip Neck: No JVD, no thyromegaly, no carotid bruits Lungs: no use of accessory muscles, no dullness to percussion, decreased without rales or rhonchi  Cardiovascular: Rhythm regular, heart sounds  normal, no murmurs or gallops, 1+ peripheral edema Musculoskeletal: No deformities, no cyanosis or clubbing , no tremors       Assessment & Plan:   Assessment and Plan Assessment & Plan COPD with acute exacerbation Acute exacerbation of COPD with increased dyspnea, productive cough with yellow sputum, and wheezing. Previous antibiotic treatment was ineffective. Differential includes primary lung problem versus cardiac issue. Current treatment with Breztri  may not be effective. Prednisone  and antibiotics are considered for management. Discussed potential switch to Trelegy if no improvement in  two weeks. Prednisone  may cause fluid retention, necessitating concurrent use of torsemide . - Prescribed omnicef for bronchitis -allergic to multiple abx - Instructed to take prednisone  as previously prescribed x 10 days - Continue Breztri  twice daily. - If no improvement in two weeks, will consider switching to  Trelegy. - Monitor response to treatment and adjust as necessary.  Edema Intermittent edema possibly related to cardiac or pulmonary issues. Currently managed with torsemide  as needed due to previous excessive fluid loss and cramping. Prednisone  may exacerbate fluid retention, necessitating concurrent use of torsemide . - Continue torsemide  as needed, especially during prednisone  treatment. - Monitor fluid status and adjust torsemide  dosage accordingly.

## 2024-06-19 NOTE — ED Provider Notes (Signed)
 3:05 PM Assumed care of patient from off-going team. For more details, please see note from same day.  In brief, this is a 63 y.o. male w/ COPD, hypertension, cardiomyopathy on torsemide , GERD, chronic renal insufficiency, HIV and prostate CA, p/w groin/scrotal/leg swelling. Taking 20 mg torsemide  x2 weeks and still gaining weight. Seen by pulmonlogist today for cough w/  sputum and though to have COPD exacerbation who ordered him abx and steroid; not wheezing on arrival. CXR okay.   Plan/Dispo at time of sign-out & ED Course since sign-out: [ ]  labs, likely admission for diuresis  BP (!) 171/104 (BP Location: Right Arm)   Pulse 78   Temp 98.3 F (36.8 C) (Oral)   Resp 16   Ht 5' 9 (1.753 m)   Wt 112.9 kg   SpO2 94%   BMI 36.76 kg/m    ED Course:   Clinical Course as of 06/19/24 2137  Thu Jun 19, 2024  1557 Potassium: 4.2 [HN]  1557 Pro Brain Natriuretic Peptide: 70.4 Could be falsely neg d/t obesity [HN]  1849 CT ABDOMEN PELVIS W CONTRAST 1. 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 2. New or more conspicuous oval lucent lesion in the anterior superior L3 vertebral body, indeterminate. Consider evaluation with nuclear medicine bone scan.   [HN]  1850 Consulted to hospitalist [HN]    Clinical Course User Index [HN] Franklyn Sid SAILOR, MD    No acute findings on CT abd pelvis. No hypoxia/SOB/resp distress. Given 40 mg IV lasix .   Dispo: Admit to hospitalist.  ------------------------------- Sid Franklyn, MD Emergency Medicine  This note was created using dictation software, which may contain spelling or grammatical errors.   Franklyn Sid SAILOR, MD 06/19/24 2137

## 2024-06-19 NOTE — H&P (Signed)
 History and Physical    Casey Brennan FMW:[id number] DOB: 05/06/1961 DOA: 06/19/2024  PCP: Norleen Lynwood ORN, MD   Patient coming from: Home   Chief Complaint: Swelling in legs and scrotum, weight gain   HPI: Casey Brennan is a 63 y.o. male with medical history significant for HIV, COPD, hypertension, NSVT, and prostate cancer status post prostatectomy who presents for evaluation of swelling in the legs and scrotum along with weight gain.  Patient has had increased shortness of breath, wheezing, and sputum production for the last couple months despite antibiotics.  He has also developed increasing leg swelling, shortness of breath when he bends over, and more recently scrotal swelling.  He reports a 15 to 20 pound weight gain associated with this.  He has been taking torsemide  but the swelling and weight gain continues to worsen.  He was seen by his pulmonologist today and instructed to take antibiotic prednisone  for COPD exacerbation.  He denies fevers, dysuria, nausea, vomiting, or chest pain  ED Course: Upon arrival to the ED, patient is found to be afebrile and saturating mid 90s on room air with normal RR, normal HR, and elevated BP.  Labs are most notable for creatinine 1.44, normal WBC, normal troponin, and normal BNP.  There are no acute findings on chest x-ray.  CT demonstrates new or more conspicuous lucent lesion in the L3 vertebral body which is indeterminate.  Patient was given 40 mg IV Lasix  in the ED.  Review of Systems:  All other systems reviewed and apart from HPI, are negative.  Past Medical History:  Diagnosis Date   Allergy    numerous food and drug allergies and hypersensitivities including hives and throat swelling for many substances, carries epipen    Arthritis    Asthma    very rare   Blood transfusion without reported diagnosis 07/2010   Chronic anemia    normocytic   Chronic folliculitis    Chronic kidney disease    Stage 2   COPD (chronic obstructive  pulmonary disease) (HCC)    Dyspnea    Environmental and seasonal allergies    GERD (gastroesophageal reflux disease)    Glaucoma    right   H/O pericarditis    2010--  myopercarditis--  resolved   Headache(784.0)    HX SEVERE FRONTAL HA'S   Hiatal hernia    History of gastric ulcer    nonbleeding gastric ulcer 58//24   History of kidney stones    History of MRSA infection 2010   infected boil   HIV (human immunodeficiency virus infection) (HCC) 1988   Hypertension    Hypertensive cardiomyopathy (HCC)    EF 37%, normal coronaries 04/2020 [facility]; LVEF 60-65% 03/2022 echo   Lytic bone lesion of hip    WORK-UP DONE BY ONCOLOGIST DR HA --  NOT MALIGNANT   Nausea & vomiting 09/04/2023   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   Pneumonia    hx of   Post concussion syndrome    resolved   Prostate cancer (HCC) 04/25/2013   gleason 3+3=6, volume 30 gm   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   CARDIAC CATHETERIZATION  03-23-2009  DR COOPER   NORMAL CORONARY ARTERIES   CARPAL TUNNEL RELEASE Right 08/09/2023   Procedure: RIGHT CARPAL TUNNEL RELEASE;  Surgeon: Murrell Drivers, MD;  Location: [facility];  Service: Orthopedics;  Laterality: Right;  75 MIN   CHOLECYSTECTOMY N/A 11/06/2014   Procedure: LAPAROSCOPIC CHOLECYSTECTOMY WITH INTRAOPERATIVE CHOLANGIOGRAM;  Surgeon: Lynwood Pina, MD;  Location: [facility] OR;  Service: General;  Laterality: N/A;   COLONOSCOPY   12/26/2011   Procedure: COLONOSCOPY;  Surgeon: Jerrell KYM Sol, MD;  Location: [facility];  Service: Endoscopy;  Laterality: N/A;   COLONOSCOPY     COLONOSCOPY WITH PROPOFOL  N/A 07/29/2014   Procedure: COLONOSCOPY WITH PROPOFOL ;  Surgeon: Jerrell KYM Sol, MD;  Location: [facility] END[facility]COPY;  Service: Endoscopy;  Laterality: N/A;   CYSTOSCOPY N/A 05/18/2018   Procedure: CYSTOSCOPY REMOVAL FOREIGN BODY;  Surgeon: Ottelin, Mark, MD;  Location: [facility] ORS;  Service: Urology;  Laterality: N/A;   CYSTOSCOPY/RETROGRADE/URETEROSCOPY Bilateral 04/25/2013   Procedure: CYSTOSCOPY/ BILATERAL RETROGRADES; BLADDER BIOPSIES;  Surgeon: Ricardo Likens, MD;  Location: [facility];  Service: Urology;  Laterality: Bilateral;   DENTAL EXAMINATION UNDER ANESTHESIA     ESOPHAGOGASTRODUODENOSCOPY  12/26/2011   Procedure: ESOPHAGOGASTRODUODENOSCOPY (EGD);  Surgeon: Jerrell KYM Sol, MD;  Location: [facility];  Service: Endoscopy;  Laterality: N/A;   ESOPHAGOGASTRODUODENOSCOPY (EGD) WITH PROPOFOL  N/A 07/29/2014   Procedure: ESOPHAGOGASTRODUODENOSCOPY (EGD) WITH PROPOFOL ;  Surgeon: Jerrell KYM Sol, MD;  Location: [facility];  Service: Endoscopy;  Laterality: N/A;   EXCISION CHRONIC LEFT BREAST ABSCESS  09/21/2010   EXCISIONAL BX LEFT BREAST MASS/  I  &  D LEFT BREAST ABSCESS  03/24/2009   IR ANGIO INTRA EXTRACRAN SEL INTERNAL CAROTID BILAT MOD SED  10/23/2023   IR ANGIO VERTEBRAL SEL VERTEBRAL BILAT MOD SED  10/23/2023   JOINT REPLACEMENT  08/2016   KNEE ARTHROSCOPY Right 1985   left axilla biopsy  04/2013   left hip biopsy  10/2012   LYMPHADENECTOMY Bilateral 08/18/2013   Procedure: LYMPHADENECTOMY PELVIC LYMPH NODE DISSECTION;  Surgeon: Ricardo Likens, MD;  Location: [facility];  Service: Urology;  Laterality: Bilateral;   POSTERIOR CERVICAL FUSION/FORAMINOTOMY N/A 11/30/2022   Procedure: POSTERIOR DECOMPRESSION AND FUSION CERVICAL 4- CERVICAL 5, CERVICAL 5- CERVICAL 6, CERVICAL 6- CERVICAL 7,  CERVICAL 7- THORACIC 1 WITH INSTRUMENTATION AND ALLOGRAFT;  Surgeon: Beuford Anes, MD;  Location: [facility] OR;  Service: Orthopedics;  Laterality: N/A;   PROSTATE BIOPSY N/A 04/25/2013   Procedure: BIOPSY TRANSRECTAL ULTRASONIC PROSTATE (TUBP);  Surgeon: Ricardo Likens, MD;  Location: [facility];  Service: Urology;  Laterality: N/A;   ROBOT ASSISTED LAPAROSCOPIC RADICAL PROSTATECTOMY N/A 08/18/2013   Procedure: ROBOTIC ASSISTED LAPAROSCOPIC RADICAL PROSTATECTOMY;  Surgeon: Ricardo Likens, MD;  Location: [facility];  Service: Urology;  Laterality: N/A;   SHOULDER ARTHROSCOPY WITH ROTATOR CUFF REPAIR AND SUBACROMIAL DECOMPRESSION Left 03/13/2023   Procedure: SHOULDER ARTHROSCOPY WITH ROTATOR CUFF REPAIR AND SUBACROMIAL DECOMPRESSION, DISTAL CLAVICLECTOMY, BICEPS TENODESIS;  Surgeon: Beverley Evalene BIRCH, MD;  Location:  SURGERY CENTER;  Service: Orthopedics;  Laterality: Left;   TOTAL KNEE ARTHROPLASTY Right 08/01/2016   08/17/16   TOTAL KNEE ARTHROPLASTY Right 08/01/2016   Procedure: TOTAL KNEE ARTHROPLASTY;  Surgeon: Evalene BIRCH Beverley, MD;  Location: [facility] OR;  Service: Orthopedics;  Laterality: Right;   ULNAR NERVE TRANSPOSITION Right 08/09/2023  Procedure: RIGHT ULNAR NERVE DECOMPRESSION AT ELBOW, POSSIBLE TRANSPOSITION;  Surgeon: Murrell Drivers, MD;  Location: [facility];  Service: Orthopedics;  Laterality: Right;   UPPER GASTROINTESTINAL ENDOSCOPY     UPPER LEG SOFT TISSUE BIOPSY Left 2012   lthigh    Social History:   reports that he quit smoking about 14 years ago. His smoking use included cigars and cigarettes. He started smoking about 37 years ago. He has a 34.5 pack-year smoking history. He has never used smokeless tobacco. He reports that he does not currently use alcohol . He reports that he does not use drugs.  Allergies[1]  Family History  Problem Relation Age of Onset   Leukemia Mother    Diabetes Mother    Stroke Father    Diabetes Father    Cancer  Father        brain cancer   Asthma Father    Hypertension Sister    Asthma Sister    Asthma Sister    Prostate cancer Maternal Uncle    Colon cancer Neg Hx    Colon polyps Neg Hx    Esophageal cancer Neg Hx    Stomach cancer Neg Hx    Rectal cancer Neg Hx      Prior to Admission medications  Medication Sig Start Date End Date Taking? Authorizing Provider  albuterol  (PROVENTIL ) (2.5 MG/3ML) 0.083% nebulizer solution USE 1 VIAL VIA NEBULIZER EVERY 6 HOURS AS NEEDED FOR WHEEZING OR SHORTNESS OF BREATH 10/25/23   Norleen Lynwood ORN, MD  ALPRAZolam  (XANAX ) 0.5 MG tablet TAKE 1/2 TABLET(0.25 MG) BY MOUTH TWICE DAILY AS NEEDED FOR SLEEP 03/25/24   Dohmeier, Dedra, MD  azelastine  (ASTELIN ) 0.1 % nasal spray Place 2 sprays into both nostrils in the morning. Use in each nostril as directed    [provider]  beclomethasone (QVAR  REDIHALER) 40 MCG/ACT inhaler Inhale 2 puffs into the lungs 2 (two) times daily. 01/02/24   Hope Almarie ORN, NP  bictegravir-emtricitabine -tenofovir  AF (BIKTARVY ) 50-200-25 MG TABS tablet Take 1 tablet by mouth daily. 05/23/24 05/23/25  Eben Reyes BROCKS, MD  budesonide -glycopyrrolate -formoterol  (BREZTRI  AEROSPHERE) 160-9-4.8 MCG/ACT AERO inhaler INHALE 2 PUFFS INTO THE LUNGS IN THE MORNING AND AT BEDTIME 06/16/24   Jude Harden GAILS, MD  cefdinir (OMNICEF) 300 MG capsule Take 1 capsule (300 mg total) by mouth 2 (two) times daily. 06/19/24   Jude Harden GAILS, MD  cetirizine  (ZYRTEC ) 10 MG tablet TAKE 1 TABLET(10 MG) BY MOUTH DAILY 01/21/24   Norleen Lynwood ORN, MD  Cholecalciferol  (VITAMIN D3) 250 MCG (10000 UT) capsule Take 1 tablet by mouth daily. [employer] 04/14/21   [provider]  ciprofloxacin  (CIPRO ) 500 MG tablet Take 500 mg by mouth 2 (two) times daily. Patient not taking: Reported on 06/19/2024 01/10/24   [provider]  cyclobenzaprine  (FLEXERIL ) 5 MG tablet Take 5-10 mg by mouth at bedtime as needed. 12/05/23   [provider]   diltiazem  (CARDIZEM ) 30 MG tablet Take 1 tablet (30 mg total) by mouth 4 (four) times daily as needed (for heart racing/palpitations). 10/17/23   Patwardhan, Newman PARAS, MD  diphenhydrAMINE  (BENADRYL ) 25 MG tablet Take 1 tablet (25 mg total) by mouth every 6 (six) hours as needed. 04/11/21   Venter, Margaux, PA-C  dorzolamide  (TRUSOPT ) 2 % ophthalmic solution Place 1 drop into both eyes 2 (two) times daily. 06/07/22   [provider]  EPINEPHrine  0.3 mg/0.3 mL IJ SOAJ injection INJECT 1 PEN IN THE MUSCLE ONE TIME AS DIRECTED AS  NEEDED FOR ANAPHYLAXIS 10/03/23   Norleen Lynwood ORN, MD  famotidine  (PEPCID ) 40 MG tablet Take 1 tablet (40 mg total) by mouth at bedtime. 01/25/24   Pyrtle, Gordy HERO, MD  gabapentin  (NEURONTIN ) 300 MG capsule Use one in AM and 2 at bedtime for neuralgia 11/22/23   Dohmeier, Dedra, MD  Glucos-Chond-Hyal Ac-Ca Fructo (MOVE FREE JOINT HEALTH ADVANCE PO) Take 1 tablet by mouth daily.    [provider]  HYDROcodone -acetaminophen  (NORCO/VICODIN) 5-325 MG tablet 1-2 tabs PO q6 hours prn pain 08/09/23   Kuzma, Kevin, MD  hydrOXYzine  (ATARAX ) 50 MG tablet TAKE 1 TABLET(50 MG) BY MOUTH TWICE DAILY 12/07/23   Patwardhan, Newman PARAS, MD  isosorbide  dinitrate (ISORDIL ) 20 MG tablet Take 1 tablet (20 mg total) by mouth 3 (three) times daily. 05/06/24   Patwardhan, Newman PARAS, MD  meclizine  (ANTIVERT ) 25 MG tablet TAKE 1 TABLET(25 MG) BY MOUTH DAILY AS NEEDED 05/21/24   Norleen Lynwood ORN, MD  methocarbamol  (ROBAXIN ) 500 MG tablet Take 1-2 tablets (500-1,000 mg total) by mouth every 6 (six) hours as needed for muscle spasms. 12/01/22   Beuford Anes, MD  metoprolol  succinate (TOPROL -XL) 100 MG 24 hr tablet Take 1 tablet (100 mg total) by mouth daily. Take with or immediately following a meal. 10/01/23   Patwardhan, Manish J, MD  montelukast  (SINGULAIR ) 10 MG tablet Take 10 mg by mouth daily. 11/15/20   [provider]  MOTEGRITY  2 MG TABS Take 1 tablet (2 mg total) by mouth daily.  PHARMACY-PLEASE PROVIDE NAME BRAND MOTEGRITY  AS THIS IS COVERED BY PT INSURANCE. THANK YOU 02/04/24   Pyrtle, Gordy HERO, MD  Multiple Vitamin (MULTIVITAMIN WITH MINERALS) TABS tablet Take 1 tablet by mouth daily after breakfast.     [provider]  mupirocin  ointment (BACTROBAN ) 2 % APPLY EXTERNALLY TO THE AFFECTED AREA TWICE DAILY FOR 3 WEEKS, PLACE COTTON BETWEEN TOES TO ALLOW BETTER AERATION AS NEEDED Patient not taking: Reported on 06/19/2024 04/20/21   Eben Reyes BROCKS, MD  naloxegol  oxalate (MOVANTIK ) 25 MG TABS tablet TAKE 1 TABLET(25 MG) BY MOUTH DAILY 03/20/24   Pyrtle, Gordy HERO, MD  nitroGLYCERIN  (NITROSTAT ) 0.4 MG SL tablet Place 1 tablet (0.4 mg total) under the tongue every 5 (five) minutes as needed for chest pain. 02/08/22 06/19/24  Patwardhan, Newman PARAS, MD  ondansetron  (ZOFRAN -ODT) 4 MG disintegrating tablet Take 1 tablet (4 mg total) by mouth every 8 (eight) hours as needed for nausea or vomiting. 03/13/24   Norleen Lynwood ORN, MD  OVER THE COUNTER MEDICATION Take 2 capsules by mouth daily. Zhou: thyroid  supplement    [provider]  pantoprazole  (PROTONIX ) 40 MG tablet TAKE 1 TABLET(40 MG) BY MOUTH TWICE DAILY BEFORE A MEAL 01/09/24   Pyrtle, Gordy HERO, MD  predniSONE  (DELTASONE ) 10 MG tablet Take 2 tabs daily with food x 5ds, then 1 tab daily with food x 5ds then STOP Patient not taking: Reported on 06/19/2024 05/22/24   Jude Harden GAILS, MD  QNASL  80 MCG/ACT AERS USE 2 SPRAYS IN EACH NOSTRIL DAILY 11/16/22   Norleen Lynwood ORN, MD  QULIPTA  30 MG TABS Prn for migraine Patient not taking: Reported on 06/19/2024 11/22/23   Dohmeier, Dedra, MD  rosuvastatin  (CRESTOR ) 20 MG tablet TAKE 1 TABLET(20 MG) BY MOUTH DAILY 08/15/23   Norleen Lynwood ORN, MD  tizanidine  (ZANAFLEX ) 6 MG capsule TAKE 1 CAPSULE(6 MG) BY MOUTH THREE TIMES DAILY 06/16/24   Norleen Lynwood ORN, MD  torsemide  (DEMADEX ) 20 MG tablet Take 0.5 tablets (  10 mg total) by mouth See admin instructions. Tuesday Thursday Saturday 06/18/24    Elmira Newman PARAS, MD  Ubrogepant  (UBRELVY ) 100 MG TABS Take 1 tablet (100 mg total) by mouth as needed (take 1 tablet at onset of migraine. may take an additional tablet 2 hrs later if needed (Do not exceed more than 2 tablets in 24 hour period)). 04/03/24   Dohmeier, Dedra, MD  VENTOLIN  HFA 108 (90 Base) MCG/ACT inhaler INHALE 2 PUFFS INTO THE LUNGS EVERY 6 HOURS AS NEEDED FOR WHEEZING OR SHORTNESS OF BREATH 06/16/24   Norleen Lynwood ORN, MD  zolpidem  (AMBIEN ) 5 MG tablet Take 1-2 tablets (5-10 mg total) by mouth at bedtime as needed for sleep. Patient not taking: Reported on 04/23/2019 12/21/16 05/19/19  Prentiss Prentice SAILOR, DO    Physical Exam: Vitals:   06/19/24 1317 06/19/24 1535 06/19/24 1842 06/19/24 2002  BP:  (!) 148/89 (!) 135/95   Pulse:  74 69   Resp:  17 20   Temp:  98.9 F (37.2 C)  98.6 F (37 C)  TempSrc:  Oral  Oral  SpO2:  93% 93%   Weight: 112.9 kg     Height: 5' 9 (1.753 m)       Constitutional: NAD, no pallor or diaphoresis   Eyes: PERTLA, lids and conjunctivae normal ENMT: Mucous membranes are moist. Posterior pharynx clear of any exudate or lesions.   Neck: supple, no masses  Respiratory: no wheezing, no crackles. No accessory muscle use.  Cardiovascular: S1 & S2 heard, regular rate and rhythm. Pretibial pitting edema bilaterally.  Abdomen: No tenderness, soft. Bowel sounds active.  Musculoskeletal: no clubbing / cyanosis. No joint deformity upper and lower extremities.   Skin: no significant rashes, lesions, ulcers. Warm, dry, well-perfused. Neurologic: CN 2-12 grossly intact. Moving all extremities. Alert and oriented.  Psychiatric: Calm. Cooperative.    Labs and Imaging on Admission: I have personally reviewed following labs and imaging studies  CBC: Recent Labs  Lab 06/19/24 1351  WBC 5.9  NEUTROABS 2.8  HGB 10.1*  HCT 32.5*  MCV 95.0  PLT 176   Basic Metabolic Panel: Recent Labs  Lab 06/19/24 1351  NA 140  K 4.2  CL 105  CO2 28  GLUCOSE  118*  BUN 8  CREATININE 1.44*  CALCIUM  8.9   GFR: Estimated Creatinine Clearance: 66.7 mL/min (A) (by C-G formula based on SCr of 1.44 mg/dL (H)). Liver Function Tests: Recent Labs  Lab 06/19/24 1351  AST 31  ALT 22  ALKPHOS 118  BILITOT 1.1  PROT 6.6  ALBUMIN  4.1   No results for input(s): LIPASE, AMYLASE in the last 168 hours. No results for input(s): AMMONIA in the last 168 hours. Coagulation Profile: No results for input(s): INR, PROTIME in the last 168 hours. Cardiac Enzymes: No results for input(s): CKTOTAL, CKMB, CKMBINDEX, TROPONINI in the last 168 hours. BNP (last 3 results) Recent Labs    06/19/24 1351  PROBNP 70.4   HbA1C: No results for input(s): HGBA1C in the last 72 hours. CBG: No results for input(s): GLUCAP in the last 168 hours. Lipid Profile: No results for input(s): CHOL, HDL, LDLCALC, TRIG, CHOLHDL, LDLDIRECT in the last 72 hours. Thyroid  Function Tests: No results for input(s): TSH, T4TOTAL, FREET4, T3FREE, THYROIDAB in the last 72 hours. Anemia Panel: No results for input(s): VITAMINB12, FOLATE, FERRITIN, TIBC, IRON, RETICCTPCT in the last 72 hours. Urine analysis:    Component Value Date/Time   COLORURINE YELLOW 06/19/2024 1730   APPEARANCEUR CLEAR  06/19/2024 1730   APPEARANCEUR Clear 02/22/2016 1609   LABSPEC 1.009 06/19/2024 1730   PHURINE 7.0 06/19/2024 1730   GLUCOSEU NEGATIVE 06/19/2024 1730   GLUCOSEU NEGATIVE 01/26/2023 1446   HGBUR NEGATIVE 06/19/2024 1730   BILIRUBINUR NEGATIVE 06/19/2024 1730   BILIRUBINUR Negative 02/22/2016 1609   KETONESUR NEGATIVE 06/19/2024 1730   PROTEINUR 100 (A) 06/19/2024 1730   UROBILINOGEN 1.0 01/26/2023 1446   NITRITE POSITIVE (A) 06/19/2024 1730   LEUKOCYTESUR NEGATIVE 06/19/2024 1730   Sepsis Labs: @LABRCNTIP (procalcitonin:4,lacticidven:4) )No results found for this or any previous visit (from the past 240 hours).   Radiological Exams  on Admission: CT ABDOMEN PELVIS W CONTRAST Result Date: 06/19/2024 EXAM: CT ABDOMEN AND PELVIS WITH CONTRAST 06/19/2024 06:12:24 PM TECHNIQUE: CT of the abdomen and pelvis was performed with the administration of 100 mL of iohexol  (OMNIPAQUE ) 300 MG/ML solution. Multiplanar reformatted images are provided for review. Automated exposure control, iterative reconstruction, and/or weight-based adjustment of the mA/kV was utilized to reduce the radiation dose to as low as reasonably achievable. COMPARISON: 10/09/2022 CLINICAL HISTORY: Abdominal pain, acute, nonlocalized; Abdominal/flank pain, stone suspected. FINDINGS: LOWER CHEST: No acute abnormality. LIVER: Hepatic steatosis. GALLBLADDER AND BILE DUCTS: Cholecystectomy. No biliary ductal dilatation. SPLEEN: No acute abnormality. PANCREAS: No acute abnormality. ADRENAL GLANDS: Stable right adrenal nodule previously characterized as an adenoma in 2014. KIDNEYS, URETERS AND BLADDER: Cortical renal scarring bilaterally. No stones in the kidneys or ureters. No hydronephrosis. No perinephric or periureteral stranding. Urinary bladder is unremarkable. GI AND BOWEL: Stomach demonstrates no acute abnormality. Similar rectal wall thickening compared to 10/09/2022. Trace adjacent stranding. No adjacent fluid or abscess. There is no bowel obstruction. PERITONEUM AND RETROPERITONEUM: No ascites. No free air. VASCULATURE: Aorta is normal in caliber. LYMPH NODES: No lymphadenopathy. REPRODUCTIVE ORGANS: Prostatectomy. BONES AND SOFT TISSUES: Subcentimeter left iliac lucent lesions are similar to prior. There is a new or more conspicuous oval lucent lesion in the anterior superior L3 vertebral body (series 9 image 103). No focal soft tissue abnormality. IMPRESSION: 1. 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 2. New or more conspicuous oval lucent lesion in the anterior superior L3 vertebral body, indeterminate. Consider evaluation with nuclear medicine bone scan.  Electronically signed by: Norman Gatlin MD 06/19/2024 06:44 PM EST RP Workstation: [id number]   DG Chest Port 1 View Result Date: 06/19/2024 CLINICAL DATA:  Shortness of breath EXAM: PORTABLE CHEST 1 VIEW COMPARISON:  September 07, 2023 FINDINGS: The heart size and mediastinal contours are within normal limits. Both lungs are clear. The visualized skeletal structures are unremarkable. IMPRESSION: No active disease. Electronically Signed   By: Lynwood Landy Raddle M.D.   On: 06/19/2024 15:02    EKG: Independently reviewed. Sinus rhythm.   Assessment/Plan   1. Acute on chronic HFpEF  - EF was 60-65% on echo from April 2025  - Continue diuresis with IV Lasix , monitor weight and I/Os    2. COPD exacerbation  - Started on prednisone  and antibiotic today by his pulmonologist  - Continue systemic steroid, antibiotic, Breztri , and as-needed short-acting bronchodilators    3. CKD II  - SCr is 1.44 on admission, up from 1.27 in October 2025  - He is hypervolemic on admission  - Renally-dose medications, monitor closely while diuresing    4. HIV  - CD4 was 333 and VL 50 in October 2025  - Continue Biktarvy     5. Hx of NSVT  - Continue metoprolol     6. L3 vertebral body lesion  - Indeterminate L3 lucent  lesion seen on CT in ED  - Discussed with patient and his sister, outpatient follow-up advised    DVT prophylaxis: Lovenox   Code Status: Full  Level of Care: Level of care: Telemetry Family Communication: Sister on speaker phone throughout the encounter   Disposition Plan:  Patient is from: home  Anticipated d/c is to: Home  Anticipated d/c date is: 12/19 or 06/21/24  Patient currently: Pending improved volume status, stable renal function  Consults called: None  Admission status: Observation     Evalene GORMAN Sprinkles, MD [facility]  06/19/2024, 9:25 PM       [1]  Allergies Allergen Reactions   Amoxapine Shortness Of Breath   Chocolate Shortness Of Breath and Other (See  Comments)    Asthma attack, welts   Ciprofloxacin  Anaphylaxis, Hives, Shortness Of Breath, Itching, Nausea And Vomiting, Swelling and Dermatitis   Descovy  [Emtricitabine -Tenofovir  Af] Shortness Of Breath    Nasuea, vomiiting, diarrhea, sob, whelps, throat closes   Doxycycline  Hyclate Shortness Of Breath and Swelling   Genvoya  [Elviteg-Cobic-Emtricit-Tenofaf] Shortness Of Breath    Nausea, vomitting, diarreha, sob, rash, throat closing   Milk-Related Compounds Other (See Comments)    Throat closing   Morphine  And Codeine Shortness Of Breath, Itching and Other (See Comments)     chest pain, hallucinations   Oxycodone -Acetaminophen  Shortness Of Breath   Penicillins Shortness Of Breath and Rash    Has patient had a PCN reaction causing immediate rash, facial/tongue/throat swelling, SOB or lightheadedness with hypotension: Yes Has patient had a PCN reaction causing severe rash involving mucus membranes or skin necrosis: No Has patient had a PCN reaction that required hospitalization No Has patient had a PCN reaction occurring within the last 10 years: Yes If all of the above answers are NO, then may proceed with Cephalosporin use.   Sulfa Antibiotics Anaphylaxis and Other (See Comments)   Tomato Shortness Of Breath   Vancomycin  Shortness Of Breath and Other (See Comments)   Amlodipine  Other (See Comments)    swelling   Carvedilol  Other (See Comments)    LEG SWELLING, DIZZNESS   Darunavir  Itching   Meloxicam  Hives   Acetaminophen  Rash and Other (See Comments)    Pt tolerates percocet and also plain APAP without rash   Flagyl  [Metronidazole ] Itching   Hydralazine  Other (See Comments)    Dizziness and leg swelling    Reglan  [Metoclopramide ]     Tremors   Toradol  [Ketorolac  Tromethamine ] Nausea And Vomiting    itching   Tramadol  Itching, Nausea And Vomiting and Other (See Comments)

## 2024-06-19 NOTE — Patient Instructions (Signed)
°  VISIT SUMMARY: During today's visit, we discussed your worsening shortness of breath, increased nebulizer use, and other symptoms related to your COPD. We also addressed your recent weight gain and swelling. We reviewed your current medications and made some adjustments to better manage your symptoms.  YOUR PLAN: -COPD WITH ACUTE EXACERBATION: You are experiencing a worsening of your COPD symptoms, including increased shortness of breath, productive cough with yellow mucus, and wheezing. This could be due to a lung or heart issue. We have prescribed Keflex  for the infection and instructed you to take prednisone  as previously prescribed. Continue using Breztri  twice daily. If there is no improvement in two weeks, we may switch your medication to Trelegy. Please monitor your response to the treatment and let us  know if there are any changes.  -EDEMA: You have intermittent swelling, which may be related to your heart or lung condition. This is currently managed with torsemide  as needed. Since prednisone  can cause fluid retention, you should continue taking torsemide  as needed, especially while on prednisone . Monitor your fluid status and adjust the torsemide  dosage as necessary.  INSTRUCTIONS: Please follow the prescribed treatment plan and monitor your symptoms closely. If there is no improvement in your COPD symptoms in two weeks, contact us  to discuss switching to Trelegy. Continue taking torsemide  as needed to manage swelling, especially while on prednisone . Keep track of any changes in your symptoms and let us  know if you experience any new or worsening issues.                      Contains text generated by [employer].                                 Contains text generated by [employer].

## 2024-06-20 DIAGNOSIS — J441 Chronic obstructive pulmonary disease with (acute) exacerbation: Secondary | ICD-10-CM | POA: Diagnosis not present

## 2024-06-20 DIAGNOSIS — R601 Generalized edema: Secondary | ICD-10-CM | POA: Diagnosis not present

## 2024-06-20 DIAGNOSIS — R1909 Other intra-abdominal and pelvic swelling, mass and lump: Secondary | ICD-10-CM | POA: Diagnosis not present

## 2024-06-20 LAB — CBC
HCT: 30.3 % — ABNORMAL LOW (ref 39.0–52.0)
Hemoglobin: 9.4 g/dL — ABNORMAL LOW (ref 13.0–17.0)
MCH: 29.3 pg (ref 26.0–34.0)
MCHC: 31 g/dL (ref 30.0–36.0)
MCV: 94.4 fL (ref 80.0–100.0)
Platelets: 156 K/uL (ref 150–400)
RBC: 3.21 MIL/uL — ABNORMAL LOW (ref 4.22–5.81)
RDW: 20.6 % — ABNORMAL HIGH (ref 11.5–15.5)
WBC: 5.2 K/uL (ref 4.0–10.5)
nRBC: 0.4 % — ABNORMAL HIGH (ref 0.0–0.2)

## 2024-06-20 LAB — BASIC METABOLIC PANEL WITH GFR
Anion gap: 7 (ref 5–15)
BUN: 12 mg/dL (ref 8–23)
CO2: 31 mmol/L (ref 22–32)
Calcium: 8.9 mg/dL (ref 8.9–10.3)
Chloride: 101 mmol/L (ref 98–111)
Creatinine, Ser: 1.54 mg/dL — ABNORMAL HIGH (ref 0.61–1.24)
GFR, Estimated: 51 mL/min — ABNORMAL LOW
Glucose, Bld: 100 mg/dL — ABNORMAL HIGH (ref 70–99)
Potassium: 3.9 mmol/L (ref 3.5–5.1)
Sodium: 139 mmol/L (ref 135–145)

## 2024-06-20 LAB — MAGNESIUM: Magnesium: 2.2 mg/dL (ref 1.7–2.4)

## 2024-06-20 MED ORDER — CEFUROXIME AXETIL 500 MG PO TABS
500.0000 mg | ORAL_TABLET | Freq: Two times a day (BID) | ORAL | Status: DC
  Administered 2024-06-20 – 2024-06-21 (×2): 500 mg via ORAL
  Filled 2024-06-20 (×2): qty 1

## 2024-06-20 NOTE — Progress Notes (Signed)
" °   06/20/24 1113  TOC Brief Assessment  Insurance and Status Reviewed  Patient has primary care physician Yes Vilinda, Lynwood ORN, MD)  Home environment has been reviewed from home  Prior level of function: Independent  Prior/Current Home Services No current home services  Social Drivers of Health Review SDOH reviewed no interventions necessary  Readmission risk has been reviewed Yes  Transition of care needs no transition of care needs at this time    "

## 2024-06-20 NOTE — Progress Notes (Signed)
 Heart Failure Navigator Progress Note  Assessed for Heart & Vascular TOC clinic readiness.  Patient does not meet criteria due to he has a scheduled [facility] appointment on 06/27/2024. No HF TOC. .   Navigator will sign off at this time.   Stephane Haddock, BSN, Scientist, Clinical (histocompatibility And Immunogenetics) Only

## 2024-06-20 NOTE — TOC Initial Note (Signed)
 Transition of Care [facility]) - Initial/Assessment Note    Patient Details  Name: Casey Brennan MRN: [id number] Date of Birth: Jan 13, 1961  Transition of Care [facility]) CM/SW Contact:    Toy LITTIE Agar, RN Phone Number:[phone]  06/20/2024, 11:15 AM  Clinical Narrative:                 [facility] acknowledges consult for SNF/other. Currently there are no evaluations or recommendations for SNF. CM will follow for any needs.     Barriers to Discharge: Continued Medical Work up   Patient Goals and CMS Choice            Expected Discharge Plan and Services                                              Prior Living Arrangements/Services                       Activities of Daily Living   ADL Screening (condition at time of admission) Independently performs ADLs?: Yes (appropriate for developmental age) Is the patient deaf or have difficulty hearing?: No Does the patient have difficulty seeing, even when wearing glasses/contacts?: No Does the patient have difficulty concentrating, remembering, or making decisions?: No  Permission Sought/Granted                  Emotional Assessment              Admission diagnosis:  Anasarca [R60.1] Groin swelling [R19.09] Patient Active Problem List   Diagnosis Date Noted   Anasarca 06/19/2024   Lesion of bone of lumbosacral spine 06/19/2024   Upper respiratory infection 03/13/2024   Cervico-occipital neuralgia 11/22/2023   NSVT (nonsustained ventricular tachycardia) (HCC) 10/01/2023   Cervical spondylosis without myelopathy 09/04/2023   Plantar fasciitis 09/04/2023   Nausea & vomiting 09/04/2023   Wheezing 05/09/2023   Borderline low oxygen  saturation level 05/09/2023   Paresthesia 04/25/2023   Right hand weakness 04/25/2023   Grief 03/30/2023   Peripheral vascular disease 03/27/2023   Left rotator cuff tear 03/13/2023   Pulmonary nodule 02/01/2023   Fever 01/28/2023   Adverse reaction to food  01/26/2023   Left shoulder pain 12/16/2022   Cervical disc disorder with radiculopathy of cervical region 11/30/2022   Malignant neoplasm metastatic to bone (HCC) 11/16/2022   Cardiomyopathy due to hypertension, without heart failure (HCC) 11/16/2022   Oropharyngeal dysphagia 10/26/2022   Bronchiectasis (HCC) 10/19/2022   Right renal mass 10/19/2022   Abnormal positron emission tomography (PET) scan 10/05/2022   Acute dysfunction of left eustachian tube 09/11/2022   Other chest pain 08/14/2022   External otitis of left ear 08/14/2022   COPD exacerbation (HCC) 06/14/2022   Precordial pain 02/08/2022   Finger pulp abscess, right 11/24/2021   Currently asymptomatic HIV infection, with history of HIV-related illness (HCC) 10/27/2021   Migraine with aura and without status migrainosus, not intractable 10/27/2021   Abnormal urine odor 08/24/2021   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   Routine screening for STI (sexually transmitted infection) 06/29/2021   Viral meningitis, unspecified 06/23/2021   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 06/22/2021   Apnea present when patient supine 06/22/2021   Action tremor 06/22/2021   AKI (acute kidney injury) 12/29/2020   Allergy to dairy product 12/27/2020   Angioneurotic edema 12/27/2020   Mild  persistent asthma, uncomplicated 12/27/2020   Allergic rhinitis 12/27/2020   Vitamin D  deficiency 12/27/2020   S/P cervical spinal fusion 11/11/2020   Nevus 11/11/2020   Myelopathy (HCC) 09/29/2020   Chronic obstructive pulmonary disease with acute exacerbation (HCC) 07/13/2020   Encounter for well adult exam with abnormal findings 06/23/2020   Elevated TSH 05/20/2020   Mixed hyperlipidemia 05/12/2020   Peripheral neuropathy 06/02/2019   Healthcare maintenance 01/08/2019   Myopia of both eyes with astigmatism and presbyopia 09/13/2018   Tension headache 11/12/2017   Hepatitis B immune 06/06/2017   Chronic pain of left wrist  06/06/2017   Constipation by delayed colonic transit 09/27/2016   Chronic renal insufficiency, stage 2 (mild) 05/22/2016   Skin lesion of right lower extremity 02/22/2016   Migraines 02/22/2016   Low back pain 02/22/2016   Osteoarthritis of right knee 01/11/2016   Kidney stones 10/25/2015   Sinusitis, chronic 05/14/2015   Cough 01/18/2015   GERD (gastroesophageal reflux disease) 07/29/2014   COPD GOLD III 07/22/2014   Hx of primary hypertension 04/27/2014   H/O prostate cancer    Lytic bone lesion of hip 08/22/2012   Cataract, nuclear 05/15/2012   Nuclear sclerotic cataract of both eyes 05/15/2012   Insomnia 05/11/2011   Essential hypertension 04/17/2011   HIV infection (HCC) 04/19/2009   PCP:  Norleen Lynwood ORN, MD Pharmacy:   [id number] - [place], [place] - [address] AT [facility] OF [address] [place] [address] Phone: [phone] Fax: [phone]  [facility] - [place], [place] - [address] [place] [address] Phone: [phone] Fax: [phone]  [facility] - [facility] [address] [place] [address] Phone: [phone] Fax: [phone]     Social Drivers of Health (SDOH) Social History: SDOH Screenings   Food Insecurity: No Food Insecurity (06/19/2024)  Housing: Low Risk (06/19/2024)  Transportation Needs: No Transportation Needs (06/19/2024)  Utilities: Not At Risk (06/19/2024)  Alcohol  Screen: Low Risk (04/04/2024)  Depression (PHQ2-9): Low Risk (04/09/2024)  Financial Resource Strain: Low Risk (04/04/2024)  Physical Activity: Sufficiently Active (04/04/2024)  Recent Concern: Physical Activity - Insufficiently Active (03/12/2024)  Social Connections: Moderately Isolated (06/19/2024)  Stress: No Stress Concern Present (04/04/2024)  Tobacco Use: Medium Risk (06/19/2024)  Health Literacy: Adequate Health Literacy (04/04/2024)    SDOH Interventions:     Readmission Risk Interventions     No data to display

## 2024-06-20 NOTE — Plan of Care (Signed)

## 2024-06-20 NOTE — Plan of Care (Signed)
" °  Problem: Education: Goal: Knowledge of General Education information will improve Description: Including pain rating scale, medication(s)/side effects and non-pharmacologic comfort measures Outcome: Progressing   Problem: Health Behavior/Discharge Planning: Goal: Ability to manage health-related needs will improve Outcome: Progressing   Problem: Clinical Measurements: Goal: Ability to maintain clinical measurements within normal limits will improve Outcome: Progressing   Problem: Clinical Measurements: Goal: Respiratory complications will improve Outcome: Progressing   Problem: Clinical Measurements: Goal: Respiratory complications will improve Outcome: Progressing   Problem: Activity: Goal: Risk for activity intolerance will decrease Outcome: Progressing   "

## 2024-06-20 NOTE — Consult Note (Addendum)
 "  Cardiology Consultation   Patient ID: Casey Brennan MRN: [id number]; DOB: 06/15/1961  Admit date: 06/19/2024 Date of Consult: 06/20/2024  PCP:  Norleen Casey ORN, MD   [facility] Providers Cardiologist:  Newman JINNY Lawrence, MD  Electrophysiologist:  OLE ONEIDA HOLTS, MD    Patient Profile: Casey Brennan is a 63 y.o. male with a hx of hypertension, hyperlipidemia, HIV, NSVT/tachybradycardia syndrome, who is being seen 06/20/2024 for the evaluation of CHF at the request of Dr. Cheryle.  History of Present Illness: Casey Brennan is a 63 year old male with above medical history who has been followed by Dr. Lawrence and Dr. Holts.  Per chart review, patient previously had coronary CTA in 04/2020 that showed coronary calcium  score of 0, normal coronary arteries.  Later, echocardiogram in 03/2022 showed EF 60-65%, mild LVH, normal wall motion.  More recently, patient was seen in the ED in early 2025 with complaints of dizziness and hypertension.  During the workup, CTA head/neck showed new finding of right ICA siphon aneurysm.  While in the ED, patient had 14 beat run of monomorphic VT and chest pain.  Referred to cardiology for further management.  Underwent echocardiogram on 10/29/2023 that showed EF 60-65%, no wall motion abnormalities, normal RV systolic function, no significant valvular abnormalities.  Cardiac monitor in 11/2023 showed 1 episode of VT lasting 4 beats, 1 episode of third-degree AV block lasting 11 seconds, 1.3% PAC burden.  He underwent cardiac MRI on 11/12/2023 was inconsistent with cardiac sarcoidosis.  EF 55% and there were no wall motion abnormalities.  He underwent coronary CTA 12/21/2023 that showed coronary calcium  score of 0.  Patient was seen by his pulmonologist on 12/18 for follow-up of COPD.  Found to have acute COPD exacerbation.  Started on prednisone .  He presented to the ED later on 12/18 reporting swelling of entire scrotum, urinary frequency,  bilateral flank pain.  In the ED, proBNP 70.4.  Troponin negative.  Lab work showed creatinine 1.44.  UA consistent with nitrites, protein, and rare bacteria.   Chest x-ray showed no active disease.  EKG showed NSR with HR 66 BPM  On interview, patient tells me that he has been having issues with wheezing since this summer. He is followed by a pulmonologist and has been on inhalers and prednisone . For the past 3 weeks or so, he has had progressive lower extremity swelling. He tried to manage this with as needed torsemide . When he would take the torsemide , he would notice increased urine output. However, he continued to have swelling. On Monday, he woke up with scrotal and leg swelling. He estimates about 10 pounds of weight gain. Since coming to the hospital, he has been on IV lasix . Had good urine output yesterday and is down about 5 lbs. Continues to feel a bit swollen in his legs.   Past Medical History:  Diagnosis Date   Allergy    numerous food and drug allergies and hypersensitivities including hives and throat swelling for many substances, carries epipen    Arthritis    Asthma    very rare   Blood transfusion without reported diagnosis 07/2010   Chronic anemia    normocytic   Chronic folliculitis    Chronic kidney disease    Stage 2   COPD (chronic obstructive pulmonary disease) (HCC)    Dyspnea    Environmental and seasonal allergies    GERD (gastroesophageal reflux disease)    Glaucoma    right   H/O pericarditis  2010--  myopercarditis--  resolved   Headache(784.0)    HX SEVERE FRONTAL HA'S   Hiatal hernia    History of gastric ulcer    nonbleeding gastric ulcer 58//24   History of kidney stones    History of MRSA infection 2010   infected boil   HIV (human immunodeficiency virus infection) (HCC) 1988   Hypertension    Hypertensive cardiomyopathy (HCC)    EF 37%, normal coronaries 04/2020 CCTA; LVEF 60-65% 03/2022 echo   Lytic bone lesion of hip    WORK-UP DONE BY  ONCOLOGIST DR HA --  NOT MALIGNANT   Nausea & vomiting 09/04/2023   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   Pneumonia    hx of   Post concussion syndrome    resolved   Prostate cancer (HCC) 04/25/2013   gleason 3+3=6, volume 30 gm   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   CARDIAC CATHETERIZATION  03-23-2009  DR COOPER   NORMAL CORONARY ARTERIES   CARPAL TUNNEL RELEASE Right 08/09/2023   Procedure: RIGHT CARPAL TUNNEL RELEASE;  Surgeon: Murrell Drivers, MD;  Location: [facility];  Service: Orthopedics;  Laterality: Right;  75 MIN   CHOLECYSTECTOMY N/A 11/06/2014   Procedure: LAPAROSCOPIC CHOLECYSTECTOMY WITH INTRAOPERATIVE CHOLANGIOGRAM;  Surgeon: Casey Pina, MD;  Location: [facility];  Service: General;  Laterality: N/A;   COLONOSCOPY  12/26/2011   Procedure: COLONOSCOPY;  Surgeon: Jerrell KYM Sol, MD;  Location: [facility];  Service: Endoscopy;  Laterality: N/A;   COLONOSCOPY     COLONOSCOPY WITH PROPOFOL  N/A 07/29/2014   Procedure: COLONOSCOPY WITH PROPOFOL ;  Surgeon: Jerrell KYM Sol, MD;  Location: [facility];  Service: Endoscopy;  Laterality: N/A;   CYSTOSCOPY N/A 05/18/2018   Procedure: CYSTOSCOPY REMOVAL FOREIGN BODY;  Surgeon: Ottelin, Brenlee Koskela, MD;  Location: [facility];  Service: Urology;  Laterality: N/A;   CYSTOSCOPY/RETROGRADE/URETEROSCOPY Bilateral 04/25/2013   Procedure: CYSTOSCOPY/ BILATERAL RETROGRADES; BLADDER BIOPSIES;  Surgeon: Ricardo Likens, MD;  Location: [facility];  Service: Urology;  Laterality: Bilateral;   DENTAL EXAMINATION UNDER ANESTHESIA     ESOPHAGOGASTRODUODENOSCOPY  12/26/2011   Procedure: ESOPHAGOGASTRODUODENOSCOPY (EGD);  Surgeon: Jerrell KYM Sol, MD;  Location: [facility];  Service: Endoscopy;  Laterality: N/A;   ESOPHAGOGASTRODUODENOSCOPY (EGD) WITH PROPOFOL  N/A 07/29/2014   Procedure: ESOPHAGOGASTRODUODENOSCOPY (EGD) WITH PROPOFOL ;  Surgeon: Jerrell KYM Sol, MD;  Location: [facility];  Service: Endoscopy;  Laterality: N/A;   EXCISION CHRONIC LEFT BREAST ABSCESS  09/21/2010   EXCISIONAL BX LEFT BREAST MASS/  I  &  D LEFT BREAST ABSCESS  03/24/2009   IR ANGIO INTRA EXTRACRAN SEL INTERNAL CAROTID BILAT MOD SED  10/23/2023   IR ANGIO VERTEBRAL SEL VERTEBRAL BILAT MOD SED  10/23/2023   JOINT REPLACEMENT  08/2016   KNEE ARTHROSCOPY Right 1985   left axilla biopsy  04/2013   left hip biopsy  10/2012   LYMPHADENECTOMY Bilateral 08/18/2013   Procedure: LYMPHADENECTOMY PELVIC LYMPH NODE DISSECTION;  Surgeon: Ricardo Likens, MD;  Location: [facility];  Service: Urology;  Laterality: Bilateral;   POSTERIOR CERVICAL FUSION/FORAMINOTOMY N/A 11/30/2022   Procedure: POSTERIOR DECOMPRESSION AND FUSION CERVICAL 4- CERVICAL 5, CERVICAL 5- CERVICAL 6, CERVICAL 6- CERVICAL 7, CERVICAL 7- THORACIC 1 WITH INSTRUMENTATION AND ALLOGRAFT;  Surgeon: Beuford Anes, MD;  Location: [facility];  Service: Orthopedics;  Laterality: N/A;   PROSTATE BIOPSY N/A 04/25/2013   Procedure: BIOPSY TRANSRECTAL ULTRASONIC PROSTATE (TUBP);  Surgeon: Ricardo Likens, MD;  Location: [facility];  Service: Urology;  Laterality: N/A;   ROBOT ASSISTED LAPAROSCOPIC RADICAL PROSTATECTOMY N/A 08/18/2013   Procedure: ROBOTIC ASSISTED LAPAROSCOPIC RADICAL PROSTATECTOMY;  Surgeon: Ricardo Likens, MD;  Location: [facility];  Service: Urology;  Laterality: N/A;   SHOULDER ARTHROSCOPY WITH ROTATOR CUFF REPAIR AND SUBACROMIAL  DECOMPRESSION Left 03/13/2023   Procedure: SHOULDER ARTHROSCOPY WITH ROTATOR CUFF REPAIR AND SUBACROMIAL DECOMPRESSION, DISTAL CLAVICLECTOMY, BICEPS TENODESIS;  Surgeon: Beverley Evalene BIRCH, MD;  Location: [facility];  Service: Orthopedics;  Laterality: Left;   TOTAL KNEE ARTHROPLASTY Right 08/01/2016   08/17/16   TOTAL KNEE ARTHROPLASTY Right 08/01/2016   Procedure: TOTAL KNEE ARTHROPLASTY;  Surgeon: Evalene BIRCH Beverley, MD;  Location: [facility];  Service: Orthopedics;  Laterality: Right;   ULNAR NERVE TRANSPOSITION Right 08/09/2023   Procedure: RIGHT ULNAR NERVE DECOMPRESSION AT ELBOW, POSSIBLE TRANSPOSITION;  Surgeon: Murrell Drivers, MD;  Location: [facility];  Service: Orthopedics;  Laterality: Right;   UPPER GASTROINTESTINAL ENDOSCOPY     UPPER LEG SOFT TISSUE BIOPSY Left 2012   lthigh      Scheduled Meds:  bictegravir-emtricitabine -tenofovir  AF  1 tablet Oral Daily   budesonide -glycopyrrolate -formoterol   2 puff Inhalation BID   cefUROXime   250 mg Oral BID WC   enoxaparin  (LOVENOX ) injection  55 mg Subcutaneous Q24H   famotidine   40 mg Oral QHS   furosemide   40 mg Intravenous Q12H   gabapentin   300 mg Oral Daily   gabapentin   600 mg Oral QHS   hydrOXYzine   50 mg Oral BID   isosorbide  dinitrate  20 mg Oral TID   metoprolol  succinate  100 mg Oral Daily   montelukast   10 mg Oral Daily   pantoprazole   40 mg Oral BID AC   predniSONE   40 mg Oral Q breakfast   rosuvastatin   20 mg Oral Daily   sodium chloride  flush  3 mL Intravenous Q12H   tiZANidine   6 mg Oral TID   Continuous Infusions:  PRN Meds: ALPRAZolam , HYDROmorphone  (DILAUDID ) injection, ipratropium-albuterol , ondansetron  **OR** ondansetron  (ZOFRAN ) IV, polyethylene glycol, traMADol   Allergies:   Allergies[1]  Social History:   Social History   Socioeconomic History   Marital status: Divorced    Spouse name: Not on file   Number of children: 1   Years of education: College   Highest education  level: Associate degree: occupational, scientist, product/process development, or vocational program  Occupational History   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 declined condoms  Other Topics Concern   Not on file  Social History Narrative   Sister April stays with him currently although he hopes to return to truck driving. 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 Sister is starting medical school next year.   Patient is divorced and lives with his sister.   Patient has one adult son.   Patient is part-time, driving a truck.   Patient has a college education.   Patient is right-handed.   Patient drinks 2-3 cups of coffee and a 4-5 cups of tea.   Social Drivers of Health   Tobacco Use: Medium Risk (06/19/2024)   Patient History    Smoking Tobacco Use: Former    Smokeless Tobacco Use: Never    Passive Exposure: Not on file  Financial Resource Strain: Low Risk (04/04/2024)   Overall Financial Resource Strain (CARDIA)    Difficulty of Paying Living Expenses: Not hard at all  Food Insecurity: No Food Insecurity (06/19/2024)   Epic    Worried About Programme Researcher, Broadcasting/film/video in the Last Year: Never true    Ran Out of Food in the Last Year: Never true  Transportation Needs: No Transportation Needs (06/19/2024)   Epic    Lack of Transportation (Medical): No    Lack of Transportation (Non-Medical): No  Physical  Activity: Sufficiently Active (04/04/2024)   Exercise Vital Sign    Days of Exercise per Week: 5 days    Minutes of Exercise per Session: 30 min  Recent Concern: Physical Activity - Insufficiently Active (03/12/2024)   Exercise Vital Sign    Days of Exercise per Week: 5 days    Minutes of Exercise per Session: 10 min  Stress: No Stress Concern Present (04/04/2024)   [employer] of Occupational Health - Occupational Stress Questionnaire    Feeling of Stress: Not at all  Social Connections: Moderately Isolated (06/19/2024)   Social Connection and Isolation Panel    Frequency of Communication with Friends and Family: More than three times a week    Frequency of Social Gatherings with Friends and Family: More than three times a week    Attends Religious Services: Never    Database Administrator or Organizations: Yes    Attends Banker Meetings: Never    Marital Status: Divorced  Catering Manager Violence: Not At Risk (06/19/2024)   Epic    Fear of Current or Ex-Partner: No    Emotionally Abused: No    Physically Abused: No    Sexually Abused: No  Depression (PHQ2-9): Low Risk (04/09/2024)   Depression (PHQ2-9)    PHQ-2 Score: 0  Alcohol  Screen: Low Risk (04/04/2024)   Alcohol  Screen    Last Alcohol  Screening Score (AUDIT): 0  Housing: Low Risk (06/19/2024)   Epic    Unable to Pay for Housing in the Last Year: No    Number of Times Moved in the Last Year: 0    Homeless in the Last Year: No  Utilities: Not At Risk (06/19/2024)   Epic    Threatened with loss of utilities: No  Health Literacy: Adequate Health Literacy (04/04/2024)   B1300 Health Literacy    Frequency of need for help with medical instructions: Never    Family History:   Family History  Problem Relation Age of Onset   Leukemia Mother  Diabetes Mother    Stroke Father    Diabetes Father    Cancer Father        brain cancer   Asthma Father    Hypertension Sister    Asthma Sister    Asthma  Sister    Prostate cancer Maternal Uncle    Colon cancer Neg Hx    Colon polyps Neg Hx    Esophageal cancer Neg Hx    Stomach cancer Neg Hx    Rectal cancer Neg Hx      ROS:  Please see the history of present illness.   All other ROS reviewed and negative.     Physical Exam/Data: Vitals:   06/19/24 2252 06/20/24 0300 06/20/24 0500 06/20/24 0843  BP:  97/64    Pulse:  63    Resp:  20    Temp:  97.8 F (36.6 C)    TempSrc:  Oral    SpO2: 93% 92%    Weight:   111 kg 109 kg  Height:    5' 9 (1.753 m)    Intake/Output Summary (Last 24 hours) at 06/20/2024 1011 Last data filed at 06/19/2024 2048 Gross per 24 hour  Intake --  Output 3000 ml  Net -3000 ml      06/20/2024    8:43 AM 06/20/2024    5:00 AM 06/19/2024    1:17 PM  Last 3 Weights  Weight (lbs) 240 lb 4.8 oz 244 lb 11.4 oz 248 lb 14.4 oz  Weight (kg) 109 kg 111 kg 112.9 kg     Body mass index is 35.49 kg/m.  General:  Well nourished, well developed, in no acute distress. Sitting upright in the bed  HEENT: normal Neck: no JVD Vascular: Radial pulses 2+ bilaterally Cardiac:  normal S1, S2; RRR. No murmurs  Lungs:  Crackles in bilateral lung bases, expiratory wheezing. Normal WOB on room air  Abd: soft, nontender. Not distended  Ext: trace edema in BLE  Musculoskeletal:  No deformities  Skin: warm and dry  Neuro:  CNs 2-12 intact, no focal abnormalities noted Psych:  Normal affect   EKG:  The EKG was personally reviewed and demonstrates:  NSR with HR 66 BPM  Telemetry:  Telemetry was personally reviewed and demonstrates:  NSR   Relevant CV Studies: Cardiac Studies & Procedures   ______________________________________________________________________________________________     ECHOCARDIOGRAM  ECHOCARDIOGRAM COMPLETE 10/29/2023  Narrative ECHOCARDIOGRAM REPORT    Patient Name:   Casey Brennan Date of Exam: 10/29/2023 Medical Rec #:  [id number]       Height:       69.0 in Accession #:     [id number]      Weight:       215.0 lb Date of Birth:  08-08-1960      BSA:          2.130 m Patient Age:    62 years        BP:           128/75 mmHg Patient Gender: M               HR:           61 bpm. Exam Location:  [address]  Procedure: 2D Echo, Color Doppler, Cardiac Doppler, 3D Echo and Strain Analysis (Both Spectral and Color Flow Doppler were utilized during procedure).  Indications:    I10 Hypertension; NSVT I47.29  History:        Patient has prior history of  Echocardiogram examinations, most recent 03/03/2022. COPD and CKD; Risk Factors:Hypertension.  Sonographer:    Augustin Seals RDCS Referring Phys: [id number] [facility] J PATWARDHAN  IMPRESSIONS   1. Left ventricular ejection fraction, by estimation, is 60 to 65%. Left ventricular ejection fraction by 3D volume is 63 %. The left ventricle has normal function. The left ventricle has no regional wall motion abnormalities. There is mild left ventricular hypertrophy. Left ventricular diastolic parameters were normal. The average left ventricular global longitudinal strain is -20.1 %. The global longitudinal strain is normal. 2. Right ventricular systolic function is normal. The right ventricular size is normal. Tricuspid regurgitation signal is inadequate for assessing PA pressure. 3. The mitral valve is normal in structure. Trivial mitral valve regurgitation. No evidence of mitral stenosis. 4. The aortic valve is tricuspid. Aortic valve regurgitation is not visualized. No aortic stenosis is present. 5. The inferior vena cava is normal in size with greater than 50% respiratory variability, suggesting right atrial pressure of 3 mmHg.  FINDINGS Left Ventricle: Left ventricular ejection fraction, by estimation, is 60 to 65%. Left ventricular ejection fraction by 3D volume is 63 %. The left ventricle has normal function. The left ventricle has no regional wall motion abnormalities. The average left ventricular global  longitudinal strain is -20.1 %. Strain was performed and the global longitudinal strain is normal. The left ventricular internal cavity size was normal in size. There is mild left ventricular hypertrophy. Left ventricular diastolic parameters were normal.  Right Ventricle: The right ventricular size is normal. No increase in right ventricular wall thickness. Right ventricular systolic function is normal. Tricuspid regurgitation signal is inadequate for assessing PA pressure.  Left Atrium: Left atrial size was normal in size.  Right Atrium: Right atrial size was normal in size.  Pericardium: There is no evidence of pericardial effusion.  Mitral Valve: The mitral valve is normal in structure. Trivial mitral valve regurgitation. No evidence of mitral valve stenosis.  Tricuspid Valve: The tricuspid valve is normal in structure. Tricuspid valve regurgitation is trivial.  Aortic Valve: The aortic valve is tricuspid. Aortic valve regurgitation is not visualized. No aortic stenosis is present.  Pulmonic Valve: The pulmonic valve was not well visualized. Pulmonic valve regurgitation is trivial.  Aorta: The aortic root is normal in size and structure.  Venous: The inferior vena cava is normal in size with greater than 50% respiratory variability, suggesting right atrial pressure of 3 mmHg.  IAS/Shunts: The interatrial septum was not well visualized.  Additional Comments: 3D was performed not requiring image post processing on an independent workstation and was normal.   LEFT VENTRICLE PLAX 2D LVIDd:         4.40 cm         Diastology LVIDs:         3.00 cm         LV e' medial:    6.85 cm/s LV PW:         1.10 cm         LV E/e' medial:  10.6 LV IVS:        1.10 cm         LV e' lateral:   13.10 cm/s LVOT diam:     2.40 cm         LV E/e' lateral: 5.5 LV SV:         66 LV SV Index:   31              2D Longitudinal LVOT Area:  4.52 cm        Strain 2D Strain GLS   -20.1 % Avg:  3D  Volume EF LV 3D EF:    Left ventricul ar ejection fraction by 3D volume is 63 %.  3D Volume EF: 3D EF:        63 % LV EDV:       169 ml LV ESV:       62 ml LV SV:        107 ml  RIGHT VENTRICLE             IVC RV Basal diam:  4.20 cm     IVC diam: 1.90 cm RV Mid diam:    3.50 cm RV S prime:     10.90 cm/s TAPSE (M-mode): 1.4 cm  LEFT ATRIUM             Index        RIGHT ATRIUM           Index LA diam:        2.70 cm 1.27 cm/m   RA Area:     16.40 cm LA Vol (A2C):   75.0 ml 35.20 ml/m  RA Volume:   45.20 ml  21.22 ml/m LA Vol (A4C):   32.1 ml 15.07 ml/m LA Biplane Vol: 50.1 ml 23.52 ml/m AORTIC VALVE LVOT Vmax:   64.00 cm/s LVOT Vmean:  41.900 cm/s LVOT VTI:    0.146 m  AORTA Ao Root diam: 3.70 cm Ao Asc diam:  3.80 cm  MITRAL VALVE MV Area (PHT): 2.99 cm    SHUNTS MV Decel Time: 254 msec    Systemic VTI:  0.15 m MV E velocity: 72.30 cm/s  Systemic Diam: 2.40 cm MV A velocity: 83.30 cm/s MV E/A ratio:  0.87  Casey Nanas MD Electronically signed by Casey Nanas MD Signature Date/Time: 10/29/2023/4:20:40 PM    Final    MONITORS  LONG TERM MONITOR (3-14 DAYS) 11/02/2023  Narrative Zio patch monitor 14 days 10/08/2023 - 10/22/2023: Dominant rhythm: Sinus. HR 47-135 bpm. Avg HR 82 bpm. 1 episode of 3rd degree AV block lasting for 11 secs (occurred at 3:08 PM). No reported symptoms. 1 episode of VT, fastest at 154 bpm for 4 beats. No reported symptoms. <1% isolated VE, couplet/triplets. 0 episodes of SVT. 1.3% isolated SVE, <1% couplet/triplets. No atrial fibrillation/atrial flutter/SVT/, sinus pause >3sec noted. 2 patient triggered events, correlated with sinus rhythm.   CT SCANS  CT CORONARY MORPH W/CTA COR W/SCORE 12/25/2023  Addendum 12/30/2023  9:10 AM ADDENDUM REPORT: 12/30/2023 09:07  EXAM: OVER-READ INTERPRETATION  CT CHEST  The following report is an over-read performed by radiologist Dr. Fonda Mom [facility] on 12/30/2023. This over-read does not include interpretation of cardiac or coronary anatomy or pathology. The coronary CTA interpretation by the cardiologist is attached.  COMPARISON:  05/18/2023.  FINDINGS: Cardiovascular:  See findings discussed in the body of the report.  Mediastinum/Nodes: No suspicious adenopathy identified. Imaged mediastinal structures are unremarkable.  Lungs/Pleura: Imaged lungs are clear. No pleural effusion or pneumothorax.  Upper Abdomen: No acute abnormality.  Musculoskeletal: No chest wall abnormality. No acute osseous findings.  IMPRESSION: No acute extracardiac incidental findings.   Electronically Signed By: Fonda Field M.D. On: 12/30/2023 09:07  Narrative CLINICAL DATA:  63 Year-old Male  EXAM: Cardiac/Coronary CTA  TECHNIQUE: A non-contrast, gated CT scan was obtained with axial slices of 3 mm through the heart for calcium  scoring. Calcium  scoring was performed  using the Agatston method. A 120 kV prospective, gated, contrast cardiac scan was obtained. Gantry rotation speed was 250 msecs and collimation was 0.6 mm. Two sublingual nitroglycerin  tablets (0.8 mg) were given. The 3D data set was reconstructed in 5% intervals of the 35-75% of the R-R cycle. Diastolic phases were analyzed on a dedicated workstation using MPR, MIP, and VRT modes. The patient received 100 cc of contrast.  FINDINGS: Coronary Arteries:  Normal coronary origin.  Right dominance.  Coronary Calcium  Score: 0  Percentile: 1st for age, sex, and race matched control.  Plaque Analysis: None detected  Left main: The left main is a large caliber vessel with a normal take off from the left coronary cusp that trifurcates to form a left anterior descending artery, RI, and a left circumflex artery. There is no significant plaque.  Left anterior descending artery: The LAD is a large caliber vessel with two diagonal vessels. There is no  significant plaque.  Left circumflex artery: The LCX is non-dominant with two obtuse marginal vessels. There is no significant plaque.  Right coronary artery: The RCA is dominant with normal take off from the right coronary cusp. the RCA terminates as a PDA and right posterolateral branch. Multiple acute marginal arteries. There is no significant plaque.  There is a ramus intermedius vessel with no significant plaque.  Other non-coronary findings:  Right Atrium: Right atrial size is within normal limits.  Right Ventricle: The right ventricular cavity is within normal limits.  Left Atrium: Left atrial size is normal in size with no left atrial appendage filling defect.  Left Ventricle: The ventricular cavity size is within normal limits.  Interatrial septum: No PFO or ASD.  Main Pulmonary Artery: Normal size of the pulmonary artery.  Pulmonary veins: Normal pulmonary venous drainage.  Pericardium: Normal thickness without significant effusion or calcium  present.  Cardiac valves: The aortic valve is trileaflet without significant calcification. The mitral valve is normal without significant calcification.  Aorta: Normal caliber without significant calcifications.  Extra-cardiac findings: See attached radiology report for non-cardiac structures.  Artifact: NA  Image quality: Excellent  IMPRESSION: 1. Coronary calcium  score of 0. This was 1st percentile for age, sex, and race matched control.  2. Normal coronary origin with right dominance.  3. CAD-RADS 0. No evidence of CAD (0%). Consider non-atherosclerotic causes of chest pain.  RECOMMENDATIONS: RECOMMENDATIONS The proposed cut-off value of 1,651 AU yielded a 93 % sensitivity and 75 % specificity in grading AS severity in patients with classical low-flow, low-gradient AS. Proposed different cut-off values to define severe AS for men and women as 2,065 AU and 1,274 AU, respectively. The joint European and  American recommendations for the assessment of AS consider the aortic valve calcium  score as a continuum - a very high calcium  score suggests severe AS and a low calcium  score suggests severe AS is unlikely.  Donney VEAR Jarome LULLA Stephen RENETTE, et al. 2017 ESC/EACTS Guidelines for the management of valvular heart disease. Eur Heart J 2017;38:2739-91.  Coronary artery calcium  (CAC) score is a strong predictor of incident coronary heart disease (CHD) and provides predictive information beyond traditional risk factors. CAC scoring is reasonable to use in the decision to withhold, postpone, or initiate statin therapy in intermediate-risk or selected borderline-risk asymptomatic adults (age 61-75 years and LDL-C >=70 to <190 mg/dL) who do not have diabetes or established atherosclerotic cardiovascular disease (ASCVD).* In intermediate-risk (10-year ASCVD risk >=7.5% to <20%) adults or selected borderline-risk (10-year ASCVD risk >=5% to <7.5%) adults in  whom a CAC score is measured for the purpose of making a treatment decision the following recommendations have been made:  If CAC = 0, it is reasonable to withhold statin therapy and reassess in 5 to 10 years, as long as higher risk conditions are absent (diabetes mellitus, family history of premature CHD in first degree relatives (males <55 years; females <65 years), cigarette smoking, LDL >=190 mg/dL or other independent risk factors).  If CAC is 1 to 99, it is reasonable to initiate statin therapy for patients >=40 years of age.  If CAC is >=100 or >=75th percentile, it is reasonable to initiate statin therapy at any age.  Cardiology referral should be considered for patients with CAC scores =400 or >=75th percentile.  *2018 AHA/ACC/AACVPR/AAPA/ABC/ACPM/ADA/AGS/APhA/ASPC/NLA/PCNA Guideline on the Management of Blood Cholesterol: A Report of the American College of Cardiology/American Heart Association Task Force on Clinical Practice  Guidelines. J Am Coll Cardiol. 2019;73(24):3168-3209.  Stanly Leavens, MD  Electronically Signed: By: Stanly Leavens M.D. On: 12/26/2023 10:52   CT SCANS  CT CORONARY MORPH W/CTA COR W/SCORE 04/07/2020  Addendum 04/07/2020  1:09 PM ADDENDUM REPORT: 04/07/2020 13:06  CLINICAL DATA:  Chest pain  EXAM: Cardiac/Coronary CTA  TECHNIQUE: The patient was scanned on a [employer]. A 100 kV prospective scan was triggered in the descending thoracic aorta at 111 HU's. Axial non-contrast 3 mm slices were carried out through the heart. The data set was analyzed on a dedicated work station and scored using the Agatson method. Gantry rotation speed was 250 msecs and collimation was .6 mm. 5 mg IV metoprolol  and 0.8 mg of sl NTG was given. The 3D data set was reconstructed in 5% intervals of the 35-75 % of the R-R cycle. Diastolic phases were analyzed on a dedicated work station using MPR, MIP and VRT modes. The patient received 80 cc of contrast.  FINDINGS: Image quality: excellent.  Noise artifact is: Limited.  Coronary Arteries:  Normal coronary origin.  Right dominance.  Left main: The left main is a large caliber vessel with a normal take off from the left coronary cusp that trifurcates into a LAD, LCX, and ramus intermedius. There is no plaque or stenosis.  Left anterior descending artery: The LAD is patent without evidence of plaque or stenosis. The LAD gives off 2 patent diagonal branches.  Ramus intermedius: Patent with no evidence of plaque or stenosis.  Left circumflex artery: The LCX is non-dominant and patent with no evidence of plaque or stenosis. The LCX gives off 1 patent obtuse marginal branch.  Right coronary artery: The RCA is dominant with normal take off from the right coronary cusp. There is no evidence of plaque or stenosis. The RCA terminates as a PDA and right posterolateral branch without evidence of plaque or  stenosis.  Right Atrium: Right atrial size is within normal limits.  Right Ventricle: The right ventricular cavity is within normal limits.  Left Atrium: Left atrial size is normal in size with no left atrial appendage filling defect.  Left Ventricle: The ventricular cavity size is within normal limits. There are no stigmata of prior infarction. There is no abnormal filling defect.  Pulmonary arteries: Normal in size without proximal filling defect.  Pulmonary veins: Normal pulmonary venous drainage.  Pericardium: Normal thickness with no significant effusion or calcium  present.  Cardiac valves: The aortic valve is trileaflet without significant calcification. The mitral valve is normal structure without significant calcification.  Aorta: Normal caliber with no significant disease.  Extra-cardiac findings: See attached radiology report for non-cardiac structures.  IMPRESSION: 1. Coronary calcium  score of 0.  2. Normal coronary origin with right dominance.  3. Normal coronary arteries.  RECOMMENDATIONS: 1. No evidence of CAD (0%). Consider non-atherosclerotic causes of chest pain.  Darryle Decent, MD   Electronically Signed By: Darryle Decent On: 04/07/2020 13:06  Narrative EXAM: OVER-READ INTERPRETATION  CT CHEST  The following report is an over-read performed by radiologist Dr. Toribio Aye of [facility] on 04/07/2020. This over-read does not include interpretation of cardiac or coronary anatomy or pathology. The coronary calcium  score/coronary CTA interpretation by the cardiologist is attached.  COMPARISON:  None.  FINDINGS: Within the visualized portions of the thorax there are no suspicious appearing pulmonary nodules or masses, there is no acute consolidative airspace disease, no pleural effusions, no pneumothorax and no lymphadenopathy. Visualized portions of the upper abdomen demonstrates an incompletely imaged  low-attenuation lesion in the upper pole of the right kidney measuring at least 3 cm, compatible with a simple cyst (better demonstrated on prior CT the abdomen and pelvis 09/25/2016). A right adrenal nodule is also incompletely imaged, but measures at least 1.2 cm in diameter, also similar to the prior study, presumably a benign lesions such as an adenoma. There are no aggressive appearing lytic or blastic lesions noted in the visualized portions of the skeleton.  IMPRESSION: No significant incidental noncardiac findings are noted.  Electronically Signed: By: Toribio Aye M.D. On: 04/07/2020 08:24   CARDIAC MRI  MR CARDIAC MORPHOLOGY W WO CONTRAST 11/12/2023  Narrative CLINICAL DATA:  Clinical question of cardiac sarcoidosis Study assumes HCT of 30 and BSA of 2.18 m2.  EXAM: CARDIAC MRI  TECHNIQUE: The patient was scanned on a 1.5 Tesla GE magnet. A dedicated cardiac coil was used. Functional imaging was done using [employer] sequences. 2,3, and 4 chamber views were done to assess for RWMA's. Modified Simpson's rule using was used to calculate an ejection fraction on a dedicated work [employer]. The patient received 10 cc of Gadavist . After 10 minutes inversion recovery sequences were used to assess for infiltration and scar tissue. Flow quantification was performed 2 times during this examination with flow quantification performed at the levels of the ascending aorta above the valve, pulmonary artery above the valve.  CONTRAST:  10 cc  of Gadavist   FINDINGS: 1. Normal left ventricular size, with LVEDD 55 mm, and LVEDVi 83 mL/m2.  Normal left ventricular thickness, with intraventricular septal thickness of 9 mm, posterior wall thickness of 5 mm, and myocardial mass index of 55 g/m2.  Normal left ventricular systolic function (LVEF =55%). There are no regional wall motion abnormalities.  Left ventricular parametric mapping notable for normal ECV.  T2 signal elevated basal septal (70 ms).  There is no late gadolinium enhancement in the left ventricular myocardium.  Normal resting perfusion.  2. Normal right ventricular size with RVEDVI 81 mL/m2.  Normal right ventricular thickness.  Normal right ventricular systolic function (RVEF =50%). There are no regional wall motion abnormalities or aneurysms.  3. Normal right atrial size. Moderate left atrial dilation (indexed volume 42 ml/m2). No evidence of interatrial communications.  4. Normal size of the aortic root, ascending aorta and pulmonary artery.  5. Valve assessment:  Aortic Valve: Tri-leaflet aortic valve. Qualitatively, there is no significant regurgitation. Regurgitant fraction 2%. Gradient 1.3 mm Hg.  Pulmonic Valve: Qualitatively, there is no significant regurgitation. Regurgitant fraction < 1%.  Tricuspid Valve: Qualitatively, there is no significant  regurgitation. Regurgitant fraction < 1%.  Mitral Valve: Qualitatively, there is mild regurgitation. Regurgitant fraction 6%.  6.  Normal pericardium.  No pericardial effusion.  7. Grossly, no extracardiac findings. Recommended dedicated study if concerned for non-cardiac pathology.  IMPRESSION: 1.  Study is inconsistent with cardiac sarcoidosis.  2. Normal left ventricular systolic function (LVEF =55%). There are no regional wall motion abnormalities.  3.  Normal right ventricular systolic function (RVEF =50%).  Stanly Leavens MD   Electronically Signed By: Stanly Leavens M.D. On: 11/12/2023 17:34   ______________________________________________________________________________________________       Laboratory Data: High Sensitivity Troponin:  No results for input(s): TROPONINIHS in the last 720 hours.  Recent Labs  Lab 06/19/24 1351  TRNPT 18      Chemistry Recent Labs  Lab 06/19/24 1351 06/20/24 0706  NA 140 139  K 4.2 3.9  CL 105 101  CO2 28 31  GLUCOSE 118*  100*  BUN 8 12  CREATININE 1.44* 1.54*  CALCIUM  8.9 8.9  MG  --  2.2  GFRNONAA 55* 51*  ANIONGAP 7 7    Recent Labs  Lab 06/19/24 1351  PROT 6.6  ALBUMIN  4.1  AST 31  ALT 22  ALKPHOS 118  BILITOT 1.1   Lipids No results for input(s): CHOL, TRIG, HDL, LABVLDL, LDLCALC, CHOLHDL in the last 168 hours.  Hematology Recent Labs  Lab 06/19/24 1351 06/20/24 0706  WBC 5.9 5.2  RBC 3.42* 3.21*  HGB 10.1* 9.4*  HCT 32.5* 30.3*  MCV 95.0 94.4  MCH 29.5 29.3  MCHC 31.1 31.0  RDW 20.9* 20.6*  PLT 176 156   Thyroid  No results for input(s): TSH, FREET4 in the last 168 hours.  BNP Recent Labs  Lab 06/19/24 1351  PROBNP 70.4    DDimer No results for input(s): DDIMER in the last 168 hours.  Radiology/Studies:  CT ABDOMEN PELVIS W CONTRAST Result Date: 06/19/2024 EXAM: CT ABDOMEN AND PELVIS WITH CONTRAST 06/19/2024 06:12:24 PM TECHNIQUE: CT of the abdomen and pelvis was performed with the administration of 100 mL of iohexol  (OMNIPAQUE ) 300 MG/ML solution. Multiplanar reformatted images are provided for review. Automated exposure control, iterative reconstruction, and/or weight-based adjustment of the mA/kV was utilized to reduce the radiation dose to as low as reasonably achievable. COMPARISON: 10/09/2022 CLINICAL HISTORY: Abdominal pain, acute, nonlocalized; Abdominal/flank pain, stone suspected. FINDINGS: LOWER CHEST: No acute abnormality. LIVER: Hepatic steatosis. GALLBLADDER AND BILE DUCTS: Cholecystectomy. No biliary ductal dilatation. SPLEEN: No acute abnormality. PANCREAS: No acute abnormality. ADRENAL GLANDS: Stable right adrenal nodule previously characterized as an adenoma in 2014. KIDNEYS, URETERS AND BLADDER: Cortical renal scarring bilaterally. No stones in the kidneys or ureters. No hydronephrosis. No perinephric or periureteral stranding. Urinary bladder is unremarkable. GI AND BOWEL: Stomach demonstrates no acute abnormality. Similar rectal wall thickening  compared to 10/09/2022. Trace adjacent stranding. No adjacent fluid or abscess. There is no bowel obstruction. PERITONEUM AND RETROPERITONEUM: No ascites. No free air. VASCULATURE: Aorta is normal in caliber. LYMPH NODES: No lymphadenopathy. REPRODUCTIVE ORGANS: Prostatectomy. BONES AND SOFT TISSUES: Subcentimeter left iliac lucent lesions are similar to prior. There is a new or more conspicuous oval lucent lesion in the anterior superior L3 vertebral body (series 9 image 103). No focal soft tissue abnormality. IMPRESSION: 1. 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 2. New or more conspicuous oval lucent lesion in the anterior superior L3 vertebral body, indeterminate. Consider evaluation with nuclear medicine bone scan. Electronically signed by: Norman Gatlin MD 06/19/2024 06:44 PM EST RP  Workstation: [id number]   DG Chest Port 1 View Result Date: 06/19/2024 CLINICAL DATA:  Shortness of breath EXAM: PORTABLE CHEST 1 VIEW COMPARISON:  September 07, 2023 FINDINGS: The heart size and mediastinal contours are within normal limits. Both lungs are clear. The visualized skeletal structures are unremarkable. IMPRESSION: No active disease. Electronically Signed   By: Casey Landy Raddle M.D.   On: 06/19/2024 15:02     Assessment and Plan:  Lower Extremity Swelling  Acute on chronic diastolic heart failure  - Echo from 10/2023 showed EF 60 to 65%, no wall motion abnormalities, normal RV systolic function, no significant valve abnormalities.  Cardiac MRI in 11/2023 showed no evidence of sarcoidosis, EF 55% - Patient has been on torsemide  20 mg as needed at home - He has been having progressive lower extremity swelling for the past 3 weeks or so.  Estimates 10 pounds weight gain. - On arrival to the ED, proBNP 70.  Troponin 18.  Chest x-ray without disease. - Patient received 1 dose of IV Lasix  40 mg yesterday evening.  Put out 3 L urine.  Weight down from 248 pounds to 240 pounds.  Estimates that his dry weight is  around 234 - Patient has received a dose of IV Lasix  40 mg this morning.  On exam, he appears euvolemic with only trace lower extremity swelling. - Continue strict I/os today.  I do not believe he will need further IV Lasix . - Transition to torsemide  20 mg daily tomorrow.  He has close follow-up with cardiology on 12/26.  Will recheck labs at that time to make sure renal function stable  Otherwise per primary  - COPD  - CKD  - HIV  - Obesity - L3 vertebral body lesion on CT  Risk Assessment/Risk Scores:   For questions or updates, please contact [facility] Please consult [email or web address] for contact info under    Signed, Rollo FABIENE Louder, PA-C  06/20/2024 10:11 AM  Personally seen and examined. Agree with above.  63 year old with anasarca, COPD, HIV.  Been wheezing, increased swelling over the last several weeks.  Echo 10/29/2023 showed EF of 65% with mild LVH trivial MR  Coronary CT scan on 12/21/2023-calcium  score 0, no coronary disease present.  Cardiac MRI 11/12/2023-LVEF 55%.  No evidence of cardiac sarcoidosis.  Normal RV function as well.  Abdominal and pelvis CT on 06/19/2024 showed hepatic steatosis, no ascites and retroperitoneum and the impression was prostatitis similar to 2024 with more conspicuous oval lucent lesion in the anterior superior L 3 vertebral body that is indeterminate consider evaluation with nuclear medicine scan  Troponin normal, creatinine 1.4, 1.5, hemoglobin 10.1 proBNP 70  Sitting up in bed crackles in bases expiratory wheezing  Acute on chronic diastolic heart failure with lower extremities edema, prior scrotal edema - Has been on torsemide  20 mg as needed at home but has had progressive lower extremity edema with 10 pound weight gain.  Received 40 mg of IV Lasix  and put out 3 L weight is down 8 pounds.  Dry weight is about 234.  Now appears euvolemic.  He received another dose of IV Lasix  40 mg this morning. -It would not be  unreasonable to transition to 20 mg of torsemide  daily tomorrow.  Has close follow-up on 12/26 in the cardiology clinic.  Can recheck labs then.  Albumin  was normal however protein was on urinalysis highly elevated.  Could his scrotal edema etc. possibly be a manifestation of nephrotic syndrome?  His protein could be  just be a manifestation of chronic kidney disease.  Abnormal heart monitor/NSVT - Had asymptomatic bradycardia during the 2 weeks he wore a ZIO monitor recently.  Mobitz type I with no evidence of high degree AV block.  No syncope.  Pacemaker not indicated.  Recommended follow-up with EP on as-needed basis. -Continue with metoprolol .  Oneil Parchment, MD      [1]  Allergies Allergen Reactions   Amoxapine Shortness Of Breath   Chocolate Shortness Of Breath and Other (See Comments)    Asthma attack, welts   Ciprofloxacin  Anaphylaxis, Hives, Shortness Of Breath, Itching, Nausea And Vomiting, Swelling and Dermatitis   Descovy  [Emtricitabine -Tenofovir  Af] Shortness Of Breath    Nasuea, vomiiting, diarrhea, sob, whelps, throat closes   Doxycycline  Hyclate Shortness Of Breath and Swelling   Genvoya  [Elviteg-Cobic-Emtricit-Tenofaf] Shortness Of Breath    Nausea, vomitting, diarreha, sob, rash, throat closing   Milk-Related Compounds Other (See Comments)    Throat closing   Morphine  And Codeine Shortness Of Breath, Itching and Other (See Comments)     chest pain, hallucinations   Oxycodone -Acetaminophen  Shortness Of Breath   Penicillins Shortness Of Breath and Rash    Has patient had a PCN reaction causing immediate rash, facial/tongue/throat swelling, SOB or lightheadedness with hypotension: Yes Has patient had a PCN reaction causing severe rash involving mucus membranes or skin necrosis: No Has patient had a PCN reaction that required hospitalization No Has patient had a PCN reaction occurring within the last 10 years: Yes If all of the above answers are NO, then may proceed  with Cephalosporin use.   Sulfa Antibiotics Anaphylaxis and Other (See Comments)   Tomato Shortness Of Breath   Vancomycin  Shortness Of Breath and Other (See Comments)   Amlodipine  Other (See Comments)    swelling   Carvedilol  Other (See Comments)    LEG SWELLING, DIZZNESS   Darunavir  Itching   Meloxicam  Hives   Acetaminophen  Rash and Other (See Comments)    Pt tolerates percocet and also plain APAP without rash   Flagyl  [Metronidazole ] Itching   Hydralazine  Other (See Comments)    Dizziness and leg swelling    Reglan  [Metoclopramide ]     Tremors   Toradol  [Ketorolac  Tromethamine ] Nausea And Vomiting    itching   Tramadol  Itching, Nausea And Vomiting and Other (See Comments)   "

## 2024-06-20 NOTE — Progress Notes (Signed)
 " PROGRESS NOTE    Casey Brennan  FMW:[id number] DOB: June 13, 1961 DOA: 06/19/2024 PCP: Norleen Lynwood ORN, MD   Brief Narrative:  63 y.o. male with medical history significant for HIV, COPD, hypertension, NSVT, and prostate cancer status post prostatectomy presented with swelling in legs and scrotum with weight gain and increasing shortness of breath.  He was seen by pulmonologist in office on 06/19/2024 and was instructed to take antibiotic and prednisone  for COPD exacerbation.  On presentation to the ED, creatinine was 1.44 with normal troponin and normal BNP.  Chest x-ray showed no acute findings.  CT abdomen and pelvis with contrast showed new or more conspicuous lucent lesion in the L3 vertebral body which is intermittent.  Patient was started on IV Lasix .  Assessment & Plan:   Possible acute on chronic diastolic heart failure Hypertension Hyperlipidemia - Presented with worsening shortness of breath, leg swelling, scrotal swelling and weight gain.  Echo had shown EF of 60 to 65% in April 2025. - Continue Lasix  IV 40 mg every 12 hours for now.  Diuresing well.  Strict input and output.  Daily weights.  Fluid restriction.  Consult cardiology.  Continue statin, isosorbide  dinitrate and metoprolol  succinate  COPD exacerbation -was seen by pulmonologist in office on 06/19/2024 and was instructed to take antibiotic and prednisone  for COPD exacerbation continue prednisone , cefuroxime  and current inhaled regimen  CKD stage II - Creatinine currently stable.  Monitor.  HIV - Currently well-controlled.  CD4 was 333 and VL 50 in October 2025.  Continue current ART.  Outpatient follow-up with ID  Obesity class II - Outpatient follow-up  History of NSVT -Will continue metoprolol .  Currently rate controlled.  L3 vertebral body lesion -Indeterminate L3 lucent lesion seen on CT in ED  - Prior hospitalist discussed with patient and his sister, outpatient follow-up advised    DVT prophylaxis:  Lovenox  Code Status: Full Family Communication: None at bedside Disposition Plan: Status is: Observation The patient will require care spanning > 2 midnights and should be moved to inpatient because: Of severity of illness    Consultants: Cardiology  Procedures: None  Antimicrobials: None   Subjective: Patient seen and examined at bedside.  Feels slightly better.  Feels that his leg swelling is improving; scrotum is still swollen.  Denies worsening abdominal pain or vomiting.  Objective: Vitals:   06/20/24 0500 06/20/24 0843 06/20/24 1012 06/20/24 1014  BP:   (!) 142/85 (!) 142/85  Pulse:   61 61  Resp:   18   Temp:   97.8 F (36.6 C)   TempSrc:   Oral   SpO2:   94%   Weight: 111 kg 109 kg    Height:  5' 9 (1.753 m)      Intake/Output Summary (Last 24 hours) at 06/20/2024 1020 Last data filed at 06/19/2024 2048 Gross per 24 hour  Intake --  Output 3000 ml  Net -3000 ml   Filed Weights   06/19/24 1317 06/20/24 0500 06/20/24 0843  Weight: 112.9 kg 111 kg 109 kg    Examination:  General exam: Appears calm and comfortable  Respiratory system: Bilateral decreased breath sounds at bases with basilar crackles Cardiovascular system: S1 & S2 heard, Rate controlled Gastrointestinal system: Abdomen is obese, nondistended, soft and nontender. Normal bowel sounds heard. Extremities: No cyanosis, clubbing; mild lower extremity edema present bilaterally  Genitourinary: Scrotal swelling present  Central nervous system: Alert and oriented. No focal neurological deficits. Moving extremities Skin: No rashes, lesions or ulcers Psychiatry:  Judgement and insight appear normal. Mood & affect appropriate.     Data Reviewed: I have personally reviewed following labs and imaging studies  CBC: Recent Labs  Lab 06/19/24 1351 06/20/24 0706  WBC 5.9 5.2  NEUTROABS 2.8  --   HGB 10.1* 9.4*  HCT 32.5* 30.3*  MCV 95.0 94.4  PLT 176 156   Basic Metabolic Panel: Recent Labs   Lab 06/19/24 1351 06/20/24 0706  NA 140 139  K 4.2 3.9  CL 105 101  CO2 28 31  GLUCOSE 118* 100*  BUN 8 12  CREATININE 1.44* 1.54*  CALCIUM  8.9 8.9  MG  --  2.2   GFR: Estimated Creatinine Clearance: 61.3 mL/min (A) (by C-G formula based on SCr of 1.54 mg/dL (H)). Liver Function Tests: Recent Labs  Lab 06/19/24 1351  AST 31  ALT 22  ALKPHOS 118  BILITOT 1.1  PROT 6.6  ALBUMIN  4.1   No results for input(s): LIPASE, AMYLASE in the last 168 hours. No results for input(s): AMMONIA in the last 168 hours. Coagulation Profile: No results for input(s): INR, PROTIME in the last 168 hours. Cardiac Enzymes: No results for input(s): CKTOTAL, CKMB, CKMBINDEX, TROPONINI in the last 168 hours. BNP (last 3 results) Recent Labs    06/19/24 1351  PROBNP 70.4   HbA1C: No results for input(s): HGBA1C in the last 72 hours. CBG: No results for input(s): GLUCAP in the last 168 hours. Lipid Profile: No results for input(s): CHOL, HDL, LDLCALC, TRIG, CHOLHDL, LDLDIRECT in the last 72 hours. Thyroid  Function Tests: No results for input(s): TSH, T4TOTAL, FREET4, T3FREE, THYROIDAB in the last 72 hours. Anemia Panel: No results for input(s): VITAMINB12, FOLATE, FERRITIN, TIBC, IRON, RETICCTPCT in the last 72 hours. Sepsis Labs: No results for input(s): PROCALCITON, LATICACIDVEN in the last 168 hours.  No results found for this or any previous visit (from the past 240 hours).       Radiology Studies: CT ABDOMEN PELVIS W CONTRAST Result Date: 06/19/2024 EXAM: CT ABDOMEN AND PELVIS WITH CONTRAST 06/19/2024 06:12:24 PM TECHNIQUE: CT of the abdomen and pelvis was performed with the administration of 100 mL of iohexol  (OMNIPAQUE ) 300 MG/ML solution. Multiplanar reformatted images are provided for review. Automated exposure control, iterative reconstruction, and/or weight-based adjustment of the mA/kV was utilized to reduce the  radiation dose to as low as reasonably achievable. COMPARISON: 10/09/2022 CLINICAL HISTORY: Abdominal pain, acute, nonlocalized; Abdominal/flank pain, stone suspected. FINDINGS: LOWER CHEST: No acute abnormality. LIVER: Hepatic steatosis. GALLBLADDER AND BILE DUCTS: Cholecystectomy. No biliary ductal dilatation. SPLEEN: No acute abnormality. PANCREAS: No acute abnormality. ADRENAL GLANDS: Stable right adrenal nodule previously characterized as an adenoma in 2014. KIDNEYS, URETERS AND BLADDER: Cortical renal scarring bilaterally. No stones in the kidneys or ureters. No hydronephrosis. No perinephric or periureteral stranding. Urinary bladder is unremarkable. GI AND BOWEL: Stomach demonstrates no acute abnormality. Similar rectal wall thickening compared to 10/09/2022. Trace adjacent stranding. No adjacent fluid or abscess. There is no bowel obstruction. PERITONEUM AND RETROPERITONEUM: No ascites. No free air. VASCULATURE: Aorta is normal in caliber. LYMPH NODES: No lymphadenopathy. REPRODUCTIVE ORGANS: Prostatectomy. BONES AND SOFT TISSUES: Subcentimeter left iliac lucent lesions are similar to prior. There is a new or more conspicuous oval lucent lesion in the anterior superior L3 vertebral body (series 9 image 103). No focal soft tissue abnormality. IMPRESSION: 1. 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 2. New or more conspicuous oval lucent lesion in the anterior superior L3 vertebral body, indeterminate. Consider evaluation with nuclear medicine  bone scan. Electronically signed by: Norman Gatlin MD 06/19/2024 06:44 PM EST RP Workstation: [id number]   DG Chest Port 1 View Result Date: 06/19/2024 CLINICAL DATA:  Shortness of breath EXAM: PORTABLE CHEST 1 VIEW COMPARISON:  September 07, 2023 FINDINGS: The heart size and mediastinal contours are within normal limits. Both lungs are clear. The visualized skeletal structures are unremarkable. IMPRESSION: No active disease. Electronically Signed   By: Lynwood Landy Raddle M.D.   On: 06/19/2024 15:02        Scheduled Meds:  bictegravir-emtricitabine -tenofovir  AF  1 tablet Oral Daily   budesonide -glycopyrrolate -formoterol   2 puff Inhalation BID   cefUROXime   250 mg Oral BID WC   enoxaparin  (LOVENOX ) injection  55 mg Subcutaneous Q24H   famotidine   40 mg Oral QHS   furosemide   40 mg Intravenous Q12H   gabapentin   300 mg Oral Daily   gabapentin   600 mg Oral QHS   hydrOXYzine   50 mg Oral BID   isosorbide  dinitrate  20 mg Oral TID   metoprolol  succinate  100 mg Oral Daily   montelukast   10 mg Oral Daily   pantoprazole   40 mg Oral BID AC   predniSONE   40 mg Oral Q breakfast   rosuvastatin   20 mg Oral Daily   sodium chloride  flush  3 mL Intravenous Q12H   tiZANidine   6 mg Oral TID   Continuous Infusions:        Sophie Mao, MD [facility] 06/20/2024, 10:20 AM   "

## 2024-06-21 DIAGNOSIS — R601 Generalized edema: Secondary | ICD-10-CM | POA: Diagnosis not present

## 2024-06-21 DIAGNOSIS — R1909 Other intra-abdominal and pelvic swelling, mass and lump: Secondary | ICD-10-CM | POA: Diagnosis not present

## 2024-06-21 LAB — BASIC METABOLIC PANEL WITH GFR
Anion gap: 8 (ref 5–15)
BUN: 18 mg/dL (ref 8–23)
CO2: 28 mmol/L (ref 22–32)
Calcium: 8.9 mg/dL (ref 8.9–10.3)
Chloride: 103 mmol/L (ref 98–111)
Creatinine, Ser: 1.53 mg/dL — ABNORMAL HIGH (ref 0.61–1.24)
GFR, Estimated: 51 mL/min — ABNORMAL LOW
Glucose, Bld: 116 mg/dL — ABNORMAL HIGH (ref 70–99)
Potassium: 4.2 mmol/L (ref 3.5–5.1)
Sodium: 138 mmol/L (ref 135–145)

## 2024-06-21 LAB — MAGNESIUM: Magnesium: 2.3 mg/dL (ref 1.7–2.4)

## 2024-06-21 MED ORDER — POLYETHYLENE GLYCOL 3350 17 G PO PACK: 17.0000 g | PACK | Freq: Every day | ORAL | 0 refills | Status: AC | PRN

## 2024-06-21 MED ORDER — HYDROXYZINE HCL 50 MG PO TABS: 50.0000 mg | ORAL_TABLET | Freq: Every day | ORAL | Status: AC

## 2024-06-21 MED ORDER — TORSEMIDE 20 MG PO TABS: 20.0000 mg | ORAL_TABLET | Freq: Every day | ORAL | Status: DC

## 2024-06-21 MED ORDER — TORSEMIDE 20 MG PO TABS: 20.0000 mg | ORAL_TABLET | Freq: Every day | ORAL | 0 refills | Status: DC

## 2024-06-21 NOTE — Plan of Care (Signed)

## 2024-06-21 NOTE — Discharge Summary (Signed)
 Physician Discharge Summary  Casey Brennan FMW:[id number] DOB: 05-Apr-1961 DOA: 06/19/2024  PCP: Norleen Lynwood ORN, MD  Admit date: 06/19/2024 Discharge date: 06/21/2024  Admitted From: Home Disposition: Home  Recommendations for Outpatient Follow-up:  Follow up with PCP in 1 week with repeat CBC/BMP Outpatient follow-up with cardiology and pulmonary Follow up in ED if symptoms worsen or new appear   Home Health: No Equipment/Devices: None  Discharge Condition: Stable CODE STATUS: Full Diet recommendation: Heart healthy/fluid restriction of up to 1500 cc a day  Brief/Interim Summary: 63 y.o. male with medical history significant for HIV, COPD, hypertension, NSVT, and prostate cancer status post prostatectomy presented with swelling in legs and scrotum with weight gain and increasing shortness of breath.  He was seen by pulmonologist in office on 06/19/2024 and was instructed to take antibiotic and prednisone  for COPD exacerbation.  On presentation to the ED, creatinine was 1.44 with normal troponin and normal BNP.  Chest x-ray showed no acute findings.  CT abdomen and pelvis with contrast showed new or more conspicuous lucent lesion in the L3 vertebral body which is intermittent.  Patient was started on IV Lasix .  During the hospitalization, his condition has improved.  He has diuresed well.  Cardiology has switched him to oral torsemide  today and cleared him for discharge.  Discharge patient home today with outpatient follow-up with PCP, pulmonary and cardiology.  Discharge Diagnoses:   Possible acute on chronic diastolic heart failure Hypertension Hyperlipidemia - Presented with worsening shortness of breath, leg swelling, scrotal swelling and weight gain.  Echo had shown EF of 60 to 65% in April 2025. - Treated with IV Lasix .  Diuresed well.  Continue diet and fluid restriction.  Cardiology evaluation appreciated.  Continue statin, isosorbide  dinitrate and metoprolol   succinate -Cardiology has switched him to oral torsemide  today and cleared him for discharge.  Discharge patient home today with outpatient follow-up with PCP, pulmonary and cardiology.   COPD exacerbation -was seen by pulmonologist in office on 06/19/2024 and was instructed to take antibiotic and prednisone  for COPD exacerbation continue prednisone , cefuroxime  and current inhaled regimen.  Outpatient follow-up with pulmonary   CKD stage II - Creatinine currently stable.  Monitor intermittently as an outpatient.   HIV - Currently well-controlled.  CD4 was 333 and VL 50 in October 2025.  Continue current ART.  Outpatient follow-up with ID   Obesity class II - Outpatient follow-up   History of NSVT -Will continue metoprolol .  Currently rate controlled.   L3 vertebral body lesion -Indeterminate L3 lucent lesion seen on CT in ED  - Prior hospitalist discussed with patient and his sister, outpatient follow-up advised    Discharge Instructions  Discharge Instructions     Ambulatory referral to Cardiology   Complete by: As directed    Diet - low sodium heart healthy   Complete by: As directed    With 1500 cc fluid restriction   Increase activity slowly   Complete by: As directed       Allergies as of 06/21/2024       Reactions   Amoxapine Shortness Of Breath   Chocolate Shortness Of Breath, Other (See Comments)   Asthma attack, welts   Ciprofloxacin  Anaphylaxis, Hives, Shortness Of Breath, Itching, Nausea And Vomiting, Swelling, Dermatitis   Descovy  [emtricitabine -tenofovir  Af] Shortness Of Breath   Nasuea, vomiiting, diarrhea, sob, whelps, throat closes   Doxycycline  Hyclate Shortness Of Breath, Swelling   Genvoya  [elviteg-cobic-emtricit-tenofaf] Shortness Of Breath   Nausea, vomitting, diarreha, sob, rash, throat  closing   Milk-related Compounds Other (See Comments)   Throat closing   Morphine  And Codeine Shortness Of Breath, Itching, Other (See Comments)    chest pain,  hallucinations   Oxycodone -acetaminophen  Shortness Of Breath   Penicillins Shortness Of Breath, Rash   Has patient had a PCN reaction causing immediate rash, facial/tongue/throat swelling, SOB or lightheadedness with hypotension: Yes Has patient had a PCN reaction causing severe rash involving mucus membranes or skin necrosis: No Has patient had a PCN reaction that required hospitalization No Has patient had a PCN reaction occurring within the last 10 years: Yes If all of the above answers are NO, then may proceed with Cephalosporin use.   Sulfa Antibiotics Anaphylaxis, Other (See Comments)   Tomato Shortness Of Breath   Vancomycin  Shortness Of Breath, Other (See Comments)   Amlodipine  Other (See Comments)   swelling   Carvedilol  Other (See Comments)   LEG SWELLING, DIZZNESS   Darunavir  Itching   Meloxicam  Hives   Acetaminophen  Rash, Other (See Comments)   Pt tolerates percocet and also plain APAP without rash   Flagyl  [metronidazole ] Itching   Hydralazine  Other (See Comments)   Dizziness and leg swelling    Reglan  [metoclopramide ]    Tremors   Toradol  [ketorolac  Tromethamine ] Nausea And Vomiting   itching   Tramadol  Itching, Nausea And Vomiting, Other (See Comments)        Medication List     STOP taking these medications    mupirocin  ointment 2 % Commonly known as: BACTROBAN    Qulipta  30 MG Tabs Generic drug: Atogepant        TAKE these medications    albuterol  (2.5 MG/3ML) 0.083% nebulizer solution Commonly known as: PROVENTIL  USE 1 VIAL VIA NEBULIZER EVERY 6 HOURS AS NEEDED FOR WHEEZING OR SHORTNESS OF BREATH   Ventolin  HFA 108 (90 Base) MCG/ACT inhaler Generic drug: albuterol  INHALE 2 PUFFS INTO THE LUNGS EVERY 6 HOURS AS NEEDED FOR WHEEZING OR SHORTNESS OF BREATH   ALPRAZolam  0.5 MG tablet Commonly known as: XANAX  TAKE 1/2 TABLET(0.25 MG) BY MOUTH TWICE DAILY AS NEEDED FOR SLEEP What changed:  how much to take how to take this when to take  this additional instructions   azelastine  0.1 % nasal spray Commonly known as: ASTELIN  Place 2 sprays into both nostrils in the morning. Use in each nostril as directed   bictegravir-emtricitabine -tenofovir  AF 50-200-25 MG Tabs tablet Commonly known as: BIKTARVY  Take 1 tablet by mouth daily.   Breztri  Aerosphere 160-9-4.8 MCG/ACT Aero inhaler Generic drug: budesonide -glycopyrrolate -formoterol  INHALE 2 PUFFS INTO THE LUNGS IN THE MORNING AND AT BEDTIME   cefdinir 300 MG capsule Commonly known as: OMNICEF Take 1 capsule (300 mg total) by mouth 2 (two) times daily.   cetirizine  10 MG tablet Commonly known as: ZYRTEC  TAKE 1 TABLET(10 MG) BY MOUTH DAILY   diltiazem  30 MG tablet Commonly known as: CARDIZEM  Take 1 tablet (30 mg total) by mouth 4 (four) times daily as needed (for heart racing/palpitations).   diphenhydrAMINE  25 MG tablet Commonly known as: BENADRYL  Take 1 tablet (25 mg total) by mouth every 6 (six) hours as needed.   dorzolamide  2 % ophthalmic solution Commonly known as: TRUSOPT  Place 1 drop into both eyes 2 (two) times daily.   EPINEPHrine  0.3 mg/0.3 mL Soaj injection Commonly known as: EPI-PEN INJECT 1 PEN IN THE MUSCLE ONE TIME AS DIRECTED AS NEEDED FOR ANAPHYLAXIS   famotidine  40 MG tablet Commonly known as: PEPCID  Take 1 tablet (40 mg total) by mouth at bedtime.  gabapentin  300 MG capsule Commonly known as: NEURONTIN  Use one in AM and 2 at bedtime for neuralgia   HYDROcodone -acetaminophen  5-325 MG tablet Commonly known as: NORCO/VICODIN 1-2 tabs PO q6 hours prn pain What changed:  how much to take how to take this when to take this   hydrOXYzine  50 MG tablet Commonly known as: ATARAX  Take 1 tablet (50 mg total) by mouth at bedtime. What changed: See the new instructions.   isosorbide  dinitrate 20 MG tablet Commonly known as: ISORDIL  Take 1 tablet (20 mg total) by mouth 3 (three) times daily.   meclizine  25 MG tablet Commonly known as:  ANTIVERT  TAKE 1 TABLET(25 MG) BY MOUTH DAILY AS NEEDED   methocarbamol  500 MG tablet Commonly known as: ROBAXIN  Take 1-2 tablets (500-1,000 mg total) by mouth every 6 (six) hours as needed for muscle spasms.   metoprolol  succinate 100 MG 24 hr tablet Commonly known as: TOPROL -XL Take 1 tablet (100 mg total) by mouth daily. Take with or immediately following a meal.   montelukast  10 MG tablet Commonly known as: SINGULAIR  Take 10 mg by mouth daily.   Motegrity  2 MG Tabs Generic drug: Prucalopride Succinate  Take 1 tablet (2 mg total) by mouth daily. PHARMACY-PLEASE PROVIDE NAME BRAND MOTEGRITY  AS THIS IS COVERED BY PT INSURANCE. THANK YOU   Movantik  25 MG Tabs tablet Generic drug: naloxegol  oxalate TAKE 1 TABLET(25 MG) BY MOUTH DAILY   MOVE FREE JOINT HEALTH ADVANCE PO Take 1 tablet by mouth daily.   multivitamin with minerals Tabs tablet Take 1 tablet by mouth daily after breakfast.   nitroGLYCERIN  0.4 MG SL tablet Commonly known as: NITROSTAT  Place 1 tablet (0.4 mg total) under the tongue every 5 (five) minutes as needed for chest pain.   ondansetron  4 MG disintegrating tablet Commonly known as: ZOFRAN -ODT Take 1 tablet (4 mg total) by mouth every 8 (eight) hours as needed for nausea or vomiting.   OVER THE COUNTER MEDICATION Take 2 capsules by mouth daily. Zhou: thyroid  supplement   pantoprazole  40 MG tablet Commonly known as: PROTONIX  TAKE 1 TABLET(40 MG) BY MOUTH TWICE DAILY BEFORE A MEAL   polyethylene glycol 17 g packet Commonly known as: MIRALAX  / GLYCOLAX  Take 17 g by mouth daily as needed for mild constipation.   predniSONE  10 MG tablet Commonly known as: DELTASONE  Take 2 tabs daily with food x 5ds, then 1 tab daily with food x 5ds then STOP   Qnasl  80 MCG/ACT Aers Generic drug: Beclomethasone Dipropionate  USE 2 SPRAYS IN EACH NOSTRIL DAILY   Qvar  RediHaler 40 MCG/ACT inhaler Generic drug: beclomethasone Inhale 2 puffs into the lungs 2 (two) times  daily.   rosuvastatin  20 MG tablet Commonly known as: CRESTOR  TAKE 1 TABLET(20 MG) BY MOUTH DAILY   tizanidine  6 MG capsule Commonly known as: ZANAFLEX  TAKE 1 CAPSULE(6 MG) BY MOUTH THREE TIMES DAILY   torsemide  20 MG tablet Commonly known as: DEMADEX  Take 1 tablet (20 mg total) by mouth daily. Start taking on: June 22, 2024 What changed:  how much to take when to take this additional instructions   Ubrelvy  100 MG Tabs Generic drug: Ubrogepant  Take 1 tablet (100 mg total) by mouth as needed (take 1 tablet at onset of migraine. may take an additional tablet 2 hrs later if needed (Do not exceed more than 2 tablets in 24 hour period)).   Vitamin D3 250 MCG (10000 UT) capsule Take 1 tablet by mouth daily. [employer]  Follow-up Information     Norleen Lynwood ORN, MD. Schedule an appointment as soon as possible for a visit in 1 week(s).   Specialties: Internal Medicine, Radiology Contact information: [address] [place] [address] [phone]                Allergies[1]  Consultations: Cardiology   Procedures/Studies: CT ABDOMEN PELVIS W CONTRAST Result Date: 06/19/2024 EXAM: CT ABDOMEN AND PELVIS WITH CONTRAST 06/19/2024 06:12:24 PM TECHNIQUE: CT of the abdomen and pelvis was performed with the administration of 100 mL of iohexol  (OMNIPAQUE ) 300 MG/ML solution. Multiplanar reformatted images are provided for review. Automated exposure control, iterative reconstruction, and/or weight-based adjustment of the mA/kV was utilized to reduce the radiation dose to as low as reasonably achievable. COMPARISON: 10/09/2022 CLINICAL HISTORY: Abdominal pain, acute, nonlocalized; Abdominal/flank pain, stone suspected. FINDINGS: LOWER CHEST: No acute abnormality. LIVER: Hepatic steatosis. GALLBLADDER AND BILE DUCTS: Cholecystectomy. No biliary ductal dilatation. SPLEEN: No acute abnormality. PANCREAS: No acute abnormality. ADRENAL GLANDS: Stable right adrenal  nodule previously characterized as an adenoma in 2014. KIDNEYS, URETERS AND BLADDER: Cortical renal scarring bilaterally. No stones in the kidneys or ureters. No hydronephrosis. No perinephric or periureteral stranding. Urinary bladder is unremarkable. GI AND BOWEL: Stomach demonstrates no acute abnormality. Similar rectal wall thickening compared to 10/09/2022. Trace adjacent stranding. No adjacent fluid or abscess. There is no bowel obstruction. PERITONEUM AND RETROPERITONEUM: No ascites. No free air. VASCULATURE: Aorta is normal in caliber. LYMPH NODES: No lymphadenopathy. REPRODUCTIVE ORGANS: Prostatectomy. BONES AND SOFT TISSUES: Subcentimeter left iliac lucent lesions are similar to prior. There is a new or more conspicuous oval lucent lesion in the anterior superior L3 vertebral body (series 9 image 103). No focal soft tissue abnormality. IMPRESSION: 1. 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 2. New or more conspicuous oval lucent lesion in the anterior superior L3 vertebral body, indeterminate. Consider evaluation with nuclear medicine bone scan. Electronically signed by: Norman Gatlin MD 06/19/2024 06:44 PM EST RP Workstation: [id number]   DG Chest Port 1 View Result Date: 06/19/2024 CLINICAL DATA:  Shortness of breath EXAM: PORTABLE CHEST 1 VIEW COMPARISON:  September 07, 2023 FINDINGS: The heart size and mediastinal contours are within normal limits. Both lungs are clear. The visualized skeletal structures are unremarkable. IMPRESSION: No active disease. Electronically Signed   By: Lynwood Landy Raddle M.D.   On: 06/19/2024 15:02      Subjective: Patient seen and examined at bedside.  Feels better and basically vomited.  No fever, vomiting, abdominal pain reported.  Leg swelling is much improved.  Discharge Exam: Vitals:   06/20/24 2129 06/21/24 0548  BP:  122/74  Pulse: 68 66  Resp:    Temp:  97.9 F (36.6 C)  SpO2: 93% 91%    General: Pt is alert, awake, not in acute distress.  On  room air.  Slow to respond.  Flat affect. Cardiovascular: rate controlled, S1/S2 + Respiratory: bilateral decreased breath sounds at bases with basilar crackles Abdominal: Soft, obese, NT, ND, bowel sounds + Extremities: Trace lower extremity edema; no cyanosis    The results of significant diagnostics from this hospitalization (including imaging, microbiology, ancillary and laboratory) are listed below for reference.     Microbiology: No results found for this or any previous visit (from the past 240 hours).   Labs: BNP (last 3 results) No results for input(s): BNP in the last 8760 hours. Basic Metabolic Panel: Recent Labs  Lab 06/19/24 1351 06/20/24 0706 06/21/24 0559  NA 140  139 138  K 4.2 3.9 4.2  CL 105 101 103  CO2 28 31 28   GLUCOSE 118* 100* 116*  BUN 8 12 18   CREATININE 1.44* 1.54* 1.53*  CALCIUM  8.9 8.9 8.9  MG  --  2.2 2.3   Liver Function Tests: Recent Labs  Lab 06/19/24 1351  AST 31  ALT 22  ALKPHOS 118  BILITOT 1.1  PROT 6.6  ALBUMIN  4.1   No results for input(s): LIPASE, AMYLASE in the last 168 hours. No results for input(s): AMMONIA in the last 168 hours. CBC: Recent Labs  Lab 06/19/24 1351 06/20/24 0706  WBC 5.9 5.2  NEUTROABS 2.8  --   HGB 10.1* 9.4*  HCT 32.5* 30.3*  MCV 95.0 94.4  PLT 176 156   Cardiac Enzymes: No results for input(s): CKTOTAL, CKMB, CKMBINDEX, TROPONINI in the last 168 hours. BNP: Invalid input(s): POCBNP CBG: No results for input(s): GLUCAP in the last 168 hours. D-Dimer No results for input(s): DDIMER in the last 72 hours. Hgb A1c No results for input(s): HGBA1C in the last 72 hours. Lipid Profile No results for input(s): CHOL, HDL, LDLCALC, TRIG, CHOLHDL, LDLDIRECT in the last 72 hours. Thyroid  function studies No results for input(s): TSH, T4TOTAL, T3FREE, THYROIDAB in the last 72 hours.  Invalid input(s): FREET3 Anemia work up No results for input(s):  VITAMINB12, FOLATE, FERRITIN, TIBC, IRON, RETICCTPCT in the last 72 hours. Urinalysis    Component Value Date/Time   COLORURINE YELLOW 06/19/2024 1730   APPEARANCEUR CLEAR 06/19/2024 1730   APPEARANCEUR Clear 02/22/2016 1609   LABSPEC 1.009 06/19/2024 1730   PHURINE 7.0 06/19/2024 1730   GLUCOSEU NEGATIVE 06/19/2024 1730   GLUCOSEU NEGATIVE 01/26/2023 1446   HGBUR NEGATIVE 06/19/2024 1730   BILIRUBINUR NEGATIVE 06/19/2024 1730   BILIRUBINUR Negative 02/22/2016 1609   KETONESUR NEGATIVE 06/19/2024 1730   PROTEINUR 100 (A) 06/19/2024 1730   UROBILINOGEN 1.0 01/26/2023 1446   NITRITE POSITIVE (A) 06/19/2024 1730   LEUKOCYTESUR NEGATIVE 06/19/2024 1730   Sepsis Labs Recent Labs  Lab 06/19/24 1351 06/20/24 0706  WBC 5.9 5.2   Microbiology No results found for this or any previous visit (from the past 240 hours).   Time coordinating discharge: 35 minutes  SIGNED:   Sophie Mao, MD  [facility] 06/21/2024, 9:59 AM      [1]  Allergies Allergen Reactions   Amoxapine Shortness Of Breath   Chocolate Shortness Of Breath and Other (See Comments)    Asthma attack, welts   Ciprofloxacin  Anaphylaxis, Hives, Shortness Of Breath, Itching, Nausea And Vomiting, Swelling and Dermatitis   Descovy  [Emtricitabine -Tenofovir  Af] Shortness Of Breath    Nasuea, vomiiting, diarrhea, sob, whelps, throat closes   Doxycycline  Hyclate Shortness Of Breath and Swelling   Genvoya  [Elviteg-Cobic-Emtricit-Tenofaf] Shortness Of Breath    Nausea, vomitting, diarreha, sob, rash, throat closing   Milk-Related Compounds Other (See Comments)    Throat closing   Morphine  And Codeine Shortness Of Breath, Itching and Other (See Comments)     chest pain, hallucinations   Oxycodone -Acetaminophen  Shortness Of Breath   Penicillins Shortness Of Breath and Rash    Has patient had a PCN reaction causing immediate rash, facial/tongue/throat swelling, SOB or lightheadedness with  hypotension: Yes Has patient had a PCN reaction causing severe rash involving mucus membranes or skin necrosis: No Has patient had a PCN reaction that required hospitalization No Has patient had a PCN reaction occurring within the last 10 years: Yes If all of the above answers are NO, then  may proceed with Cephalosporin use.   Sulfa Antibiotics Anaphylaxis and Other (See Comments)   Tomato Shortness Of Breath   Vancomycin  Shortness Of Breath and Other (See Comments)   Amlodipine  Other (See Comments)    swelling   Carvedilol  Other (See Comments)    LEG SWELLING, DIZZNESS   Darunavir  Itching   Meloxicam  Hives   Acetaminophen  Rash and Other (See Comments)    Pt tolerates percocet and also plain APAP without rash   Flagyl  [Metronidazole ] Itching   Hydralazine  Other (See Comments)    Dizziness and leg swelling    Reglan  [Metoclopramide ]     Tremors   Toradol  [Ketorolac  Tromethamine ] Nausea And Vomiting    itching   Tramadol  Itching, Nausea And Vomiting and Other (See Comments)

## 2024-06-21 NOTE — Plan of Care (Signed)
  Problem: Education: Goal: Knowledge of General Education information will improve Description: Including pain rating scale, medication(s)/side effects and non-pharmacologic comfort measures Outcome: Progressing   Problem: Health Behavior/Discharge Planning: Goal: Ability to manage health-related needs will improve Outcome: Progressing   Problem: Clinical Measurements: Goal: Ability to maintain clinical measurements within normal limits will improve Outcome: Progressing Goal: Will remain free from infection Outcome: Progressing Goal: Diagnostic test results will improve Outcome: Progressing Goal: Respiratory complications will improve Outcome: Progressing Goal: Cardiovascular complication will be avoided Outcome: Progressing   Problem: Activity: Goal: Risk for activity intolerance will decrease Outcome: Progressing   Problem: Nutrition: Goal: Adequate nutrition will be maintained Outcome: Progressing   Problem: Elimination: Goal: Will not experience complications related to bowel motility Outcome: Progressing Goal: Will not experience complications related to urinary retention Outcome: Progressing   Problem: Pain Managment: Goal: General experience of comfort will improve and/or be controlled Outcome: Progressing   Problem: Safety: Goal: Ability to remain free from injury will improve Outcome: Progressing   Problem: Skin Integrity: Goal: Risk for impaired skin integrity will decrease Outcome: Progressing   Problem: Education: Goal: Ability to demonstrate management of disease process will improve Outcome: Progressing Goal: Ability to verbalize understanding of medication therapies will improve Outcome: Progressing Goal: Individualized Educational Video(s) Outcome: Progressing   Problem: Activity: Goal: Capacity to carry out activities will improve Outcome: Progressing   Problem: Cardiac: Goal: Ability to achieve and maintain adequate cardiopulmonary  perfusion will improve Outcome: Progressing

## 2024-06-21 NOTE — Progress Notes (Signed)
 "  Progress Note  Patient Name: Casey Brennan Date of Encounter: 06/21/2024  Primary Cardiologist: Newman JINNY Lawrence, MD   Subjective   Patient seen examined at his bedside. He offered no complaints at this time. Looking forward to going home.   Inpatient Medications    Scheduled Meds:  bictegravir-emtricitabine -tenofovir  AF  1 tablet Oral Daily   budesonide -glycopyrrolate -formoterol   2 puff Inhalation BID   cefUROXime   500 mg Oral BID WC   famotidine   40 mg Oral QHS   gabapentin   300 mg Oral Daily   gabapentin   600 mg Oral QHS   hydrOXYzine   50 mg Oral BID   isosorbide  dinitrate  20 mg Oral TID   metoprolol  succinate  100 mg Oral Daily   montelukast   10 mg Oral Daily   pantoprazole   40 mg Oral BID AC   predniSONE   40 mg Oral Q breakfast   rosuvastatin   20 mg Oral Daily   sodium chloride  flush  3 mL Intravenous Q12H   tiZANidine   6 mg Oral TID   Continuous Infusions:  PRN Meds: ALPRAZolam , HYDROmorphone  (DILAUDID ) injection, ipratropium-albuterol , ondansetron  **OR** ondansetron  (ZOFRAN ) IV, polyethylene glycol, traMADol    Vital Signs    Vitals:   06/20/24 2118 06/20/24 2129 06/21/24 0500 06/21/24 0548  BP: 114/64   122/74  Pulse: 66 68  66  Resp: 17     Temp: 98 F (36.7 C)   97.9 F (36.6 C)  TempSrc: Oral     SpO2: (!) 87% 93%  91%  Weight:   108.7 kg   Height:        Intake/Output Summary (Last 24 hours) at 06/21/2024 0901 Last data filed at 06/20/2024 1700 Gross per 24 hour  Intake 480 ml  Output --  Net 480 ml   Filed Weights   06/20/24 0500 06/20/24 0843 06/21/24 0500  Weight: 111 kg 109 kg 108.7 kg    Telemetry     - Personally Reviewed  ECG     - Personally Reviewed  Physical Exam    General: Comfortable Head: Atraumatic, normal size  Eyes: PEERLA, EOMI  Neck: Supple, normal JVD Cardiac: Normal S1, S2; RRR; no murmurs, rubs, or gallops Lungs: Clear to auscultation bilaterally Abd: Soft, nontender, no hepatomegaly  Ext: warm,  no edema Musculoskeletal: No deformities, BUE and BLE strength normal and equal Skin: Warm and dry, no rashes   Neuro: Alert and oriented to person, place, time, and situation, CNII-XII grossly intact, no focal deficits  Psych: Normal mood and affect   Labs    Chemistry Recent Labs  Lab 06/19/24 1351 06/20/24 0706 06/21/24 0559  NA 140 139 138  K 4.2 3.9 4.2  CL 105 101 103  CO2 28 31 28   GLUCOSE 118* 100* 116*  BUN 8 12 18   CREATININE 1.44* 1.54* 1.53*  CALCIUM  8.9 8.9 8.9  PROT 6.6  --   --   ALBUMIN  4.1  --   --   AST 31  --   --   ALT 22  --   --   ALKPHOS 118  --   --   BILITOT 1.1  --   --   GFRNONAA 55* 51* 51*  ANIONGAP 7 7 8      Hematology Recent Labs  Lab 06/19/24 1351 06/20/24 0706  WBC 5.9 5.2  RBC 3.42* 3.21*  HGB 10.1* 9.4*  HCT 32.5* 30.3*  MCV 95.0 94.4  MCH 29.5 29.3  MCHC 31.1 31.0  RDW 20.9* 20.6*  PLT 176 156    Cardiac EnzymesNo results for input(s): TROPONINI in the last 168 hours. No results for input(s): TROPIPOC in the last 168 hours.   BNP Recent Labs  Lab 06/19/24 1351  PROBNP 70.4     DDimer No results for input(s): DDIMER in the last 168 hours.   Radiology    CT ABDOMEN PELVIS W CONTRAST Result Date: 06/19/2024 EXAM: CT ABDOMEN AND PELVIS WITH CONTRAST 06/19/2024 06:12:24 PM TECHNIQUE: CT of the abdomen and pelvis was performed with the administration of 100 mL of iohexol  (OMNIPAQUE ) 300 MG/ML solution. Multiplanar reformatted images are provided for review. Automated exposure control, iterative reconstruction, and/or weight-based adjustment of the mA/kV was utilized to reduce the radiation dose to as low as reasonably achievable. COMPARISON: 10/09/2022 CLINICAL HISTORY: Abdominal pain, acute, nonlocalized; Abdominal/flank pain, stone suspected. FINDINGS: LOWER CHEST: No acute abnormality. LIVER: Hepatic steatosis. GALLBLADDER AND BILE DUCTS: Cholecystectomy. No biliary ductal dilatation. SPLEEN: No acute abnormality.  PANCREAS: No acute abnormality. ADRENAL GLANDS: Stable right adrenal nodule previously characterized as an adenoma in 2014. KIDNEYS, URETERS AND BLADDER: Cortical renal scarring bilaterally. No stones in the kidneys or ureters. No hydronephrosis. No perinephric or periureteral stranding. Urinary bladder is unremarkable. GI AND BOWEL: Stomach demonstrates no acute abnormality. Similar rectal wall thickening compared to 10/09/2022. Trace adjacent stranding. No adjacent fluid or abscess. There is no bowel obstruction. PERITONEUM AND RETROPERITONEUM: No ascites. No free air. VASCULATURE: Aorta is normal in caliber. LYMPH NODES: No lymphadenopathy. REPRODUCTIVE ORGANS: Prostatectomy. BONES AND SOFT TISSUES: Subcentimeter left iliac lucent lesions are similar to prior. There is a new or more conspicuous oval lucent lesion in the anterior superior L3 vertebral body (series 9 image 103). No focal soft tissue abnormality. IMPRESSION: 1. 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 2. New or more conspicuous oval lucent lesion in the anterior superior L3 vertebral body, indeterminate. Consider evaluation with nuclear medicine bone scan. Electronically signed by: Norman Gatlin MD 06/19/2024 06:44 PM EST RP Workstation: [id number]   DG Chest Port 1 View Result Date: 06/19/2024 CLINICAL DATA:  Shortness of breath EXAM: PORTABLE CHEST 1 VIEW COMPARISON:  September 07, 2023 FINDINGS: The heart size and mediastinal contours are within normal limits. Both lungs are clear. The visualized skeletal structures are unremarkable. IMPRESSION: No active disease. Electronically Signed   By: Lynwood Landy Raddle M.D.   On: 06/19/2024 15:02    Cardiac Studies   Coronary CTA, cardiac MRI, echocardiogram  Patient Profile     63 y.o. male admitted for acute on chronic heart failure  Assessment & Plan    Acute on chronic diastolic heart failure NSVT CKD HIV Obesity   Clinically he appears to be euvolemic. I will transition to  Toresmide 20mg  daily. Please discharge him on this dose.  Prior Mobitz Type 1 no High grage AV block please continue with current dose of Metoprolol .  Outpatient follow has been scheduled.   From a CV standpoint he can be discharged to home.     For questions or updates, please contact [facility] Please consult [email or web address] for contact info under Cardiology/STEMI.      Signed, Sircharles Holzheimer, DO  06/21/2024, 9:01 AM    "

## 2024-06-23 ENCOUNTER — Telehealth: Payer: Self-pay

## 2024-06-23 NOTE — Telephone Encounter (Signed)
 April returning call from [facility]. Please advise.

## 2024-06-23 NOTE — Telephone Encounter (Signed)
 Pt sister contacted and advised. Agrees with plan of care.

## 2024-06-23 NOTE — Transitions of Care (Post Inpatient/ED Visit) (Signed)
 "  06/23/2024  Name: Casey Brennan MRN: [id number] DOB: 05/07/1961  Today's TOC FU Call Status: Today's TOC FU Call Status:: Successful TOC FU Call Completed TOC FU Call Complete Date: 06/23/24  Patient's Name and Date of Birth confirmed. DOB, Name  Transition Care Management Follow-up Telephone Call Date of Discharge: 06/21/24 Discharge Facility: [facility]) Type of Discharge: Inpatient Admission Primary Inpatient Discharge Diagnosis:: Anasarca How have you been since you were released from the hospital?: Better Any questions or concerns?: Yes Patient Questions/Concerns:: (S) Continues to have fatigue and dizziness Patient Questions/Concerns Addressed: Notified Provider of Patient Questions/Concerns  Items Reviewed: Did you receive and understand the discharge instructions provided?: Yes Medications obtained,verified, and reconciled?: Yes (Medications Reviewed) Any new allergies since your discharge?: No Dietary orders reviewed?: Yes Type of Diet Ordered:: Heart healthy Do you have support at home?: Yes People in Home [RPT]: sibling(s) Name of Support/Comfort Primary Source: sister- [name]  Medications Reviewed Today: Medications Reviewed Today     Reviewed by Lavelle Charmaine NOVAK, LPN (Licensed Practical Nurse) on 06/23/24 at 1432  Med List Status: <None>   Medication Order Taking? Sig Documenting Provider Last Dose Status Informant  albuterol  (PROVENTIL ) (2.5 MG/3ML) 0.083% nebulizer solution [id number] No USE 1 VIAL VIA NEBULIZER EVERY 6 HOURS AS NEEDED FOR WHEEZING OR SHORTNESS OF BREATH Norleen Lynwood ORN, MD Unknown Active Family Member  ALPRAZolam  (XANAX ) 0.5 MG tablet [id number] No TAKE 1/2 TABLET(0.25 MG) BY MOUTH TWICE DAILY AS NEEDED FOR SLEEP  Patient taking differently: Take 0.25 mg by mouth at bedtime.   Dohmeier, Dedra, MD 06/18/2024 Bedtime Active Family Member  azelastine  (ASTELIN ) 0.1 % nasal spray [id number] No Place 2 sprays into both nostrils in the  morning. Use in each nostril as directed [provider] 06/19/2024 Morning Active Family Member  beclomethasone (QVAR  REDIHALER) 40 MCG/ACT inhaler [id number] No Inhale 2 puffs into the lungs 2 (two) times daily. Hope Almarie ORN, NP 06/19/2024 Active Family Member  bictegravir-emtricitabine -tenofovir  AF (BIKTARVY ) 50-200-25 MG TABS tablet [id number] No Take 1 tablet by mouth daily. Eben Reyes BROCKS, MD 06/19/2024 Active Family Member  budesonide -glycopyrrolate -formoterol  (BREZTRI  AEROSPHERE) 160-9-4.8 MCG/ACT AERO inhaler [id number] No INHALE 2 PUFFS INTO THE LUNGS IN THE MORNING AND AT BEDTIME Jude Harden GAILS, MD 06/19/2024 Active Family Member  cefdinir (OMNICEF) 300 MG capsule [id number]  Take 1 capsule (300 mg total) by mouth 2 (two) times daily. Jude Harden GAILS, MD  Active Family Member           Med Note ALLEGRA, [facility]   Thu Jun 19, 2024 10:57 PM) Hasn't started  cetirizine  (ZYRTEC ) 10 MG tablet [id number] No TAKE 1 TABLET(10 MG) BY MOUTH DAILY Norleen Lynwood ORN, MD 06/19/2024 Active Family Member  Cholecalciferol  (VITAMIN D3) 250 MCG (10000 UT) capsule [id number] No Take 1 tablet by mouth daily. [employer] [provider] 06/19/2024 Active Family Member  diltiazem  (CARDIZEM ) 30 MG tablet [id number] No Take 1 tablet (30 mg total) by mouth 4 (four) times daily as needed (for heart racing/palpitations). Patwardhan, Newman PARAS, MD Unknown Active Family Member  diphenhydrAMINE  (BENADRYL ) 25 MG tablet [id number] No Take 1 tablet (25 mg total) by mouth every 6 (six) hours as needed. Shepard Clinch, PA-C Unknown Active Family Member  dorzolamide  (TRUSOPT ) 2 % ophthalmic solution [id number] No Place 1 drop into both eyes 2 (two) times daily. [provider] 06/19/2024 Active Family Member  EPINEPHrine  0.3 mg/0.3 mL IJ SOAJ injection [id number] No INJECT 1 PEN IN THE MUSCLE ONE TIME AS  DIRECTED AS NEEDED FOR ANAPHYLAXIS Norleen Lynwood ORN, MD Unknown Active Family Member  famotidine   (PEPCID ) 40 MG tablet [id number] No Take 1 tablet (40 mg total) by mouth at bedtime. Albertus Gordy HERO, MD 06/18/2024 Bedtime Active Family Member  gabapentin  (NEURONTIN ) 300 MG capsule [id number] No Use one in AM and 2 at bedtime for neuralgia Dohmeier, Dedra, MD 06/19/2024 Morning Active Family Member  Glucos-Chond-Hyal Ac-Ca Fructo (MOVE FREE JOINT HEALTH ADVANCE PO) [id number] No Take 1 tablet by mouth daily. [provider] 06/19/2024 Active Family Member  HYDROcodone -acetaminophen  (NORCO/VICODIN) 5-325 MG tablet [id number] No 1-2 tabs PO q6 hours prn pain  Patient taking differently: Take 1 tablet by mouth daily. 1-2 tabs PO q6 hours prn pain   Kuzma, Kevin, MD 06/19/2024 Active Family Member  hydrOXYzine  (ATARAX ) 50 MG tablet [id number]  Take 1 tablet (50 mg total) by mouth at bedtime. Cheryle Page, MD  Active   isosorbide  dinitrate (ISORDIL ) 20 MG tablet [id number] No Take 1 tablet (20 mg total) by mouth 3 (three) times daily. Elmira Newman PARAS, MD 06/19/2024 Active Family Member  meclizine  (ANTIVERT ) 25 MG tablet [id number] No TAKE 1 TABLET(25 MG) BY MOUTH DAILY AS NEEDED Norleen Lynwood ORN, MD Unknown Active Family Member  methocarbamol  (ROBAXIN ) 500 MG tablet [id number] No Take 1-2 tablets (500-1,000 mg total) by mouth every 6 (six) hours as needed for muscle spasms. Beuford Anes, MD Unknown Active Family Member  metoprolol  succinate (TOPROL -XL) 100 MG 24 hr tablet [id number] No Take 1 tablet (100 mg total) by mouth daily. Take with or immediately following a meal. Patwardhan, Newman PARAS, MD 06/19/2024 Active Family Member  montelukast  (SINGULAIR ) 10 MG tablet [id number] No Take 10 mg by mouth daily. [provider] 06/19/2024 Active Family Member  MOTEGRITY  2 MG TABS [id number] No Take 1 tablet (2 mg total) by mouth daily. PHARMACY-PLEASE PROVIDE NAME BRAND MOTEGRITY  AS THIS IS COVERED BY PT INSURANCE. THANK YOU Albertus Gordy HERO, MD 06/19/2024 Active Family Member  Multiple Vitamin  (MULTIVITAMIN WITH MINERALS) TABS tablet [id number] No Take 1 tablet by mouth daily after breakfast.  [provider] 06/19/2024 Active Family Member  naloxegol  oxalate (MOVANTIK ) 25 MG TABS tablet [id number] No TAKE 1 TABLET(25 MG) BY MOUTH DAILY Pyrtle, Gordy HERO, MD 06/19/2024 Active Family Member  nitroGLYCERIN  (NITROSTAT ) 0.4 MG SL tablet [id number] No Place 1 tablet (0.4 mg total) under the tongue every 5 (five) minutes as needed for chest pain. Elmira Newman PARAS, MD Unknown Expired 06/19/24 2359 Family Member           Med Note ALLEGRA, [facility] I   Thu Jun 19, 2024 11:01 PM)    ondansetron  (ZOFRAN -ODT) 4 MG disintegrating tablet [id number] No Take 1 tablet (4 mg total) by mouth every 8 (eight) hours as needed for nausea or vomiting. Norleen Lynwood ORN, MD Unknown Active Family Member  OVER THE COUNTER MEDICATION [id number] No Take 2 capsules by mouth daily. Zhou: thyroid  supplement [provider] 06/19/2024 Active Family Member  pantoprazole  (PROTONIX ) 40 MG tablet [id number] No TAKE 1 TABLET(40 MG) BY MOUTH TWICE DAILY BEFORE A MEAL Pyrtle, Gordy HERO, MD 06/19/2024 Active Family Member  polyethylene glycol (MIRALAX  / GLYCOLAX ) 17 g packet [id number]  Take 17 g by mouth daily as needed for mild constipation. Cheryle Page, MD  Active   predniSONE  (DELTASONE ) 10 MG tablet [id number]  Take 2 tabs daily with food x 5ds, then 1 tab daily with food x 5ds then STOP  Patient not taking: Reported on 06/19/2024   Jude,  Harden GAILS, MD  Active Family Member  QNASL  80 MCG/ACT AERS [id number] No USE 2 SPRAYS IN [facility] NOSTRIL DAILY Norleen Lynwood ORN, MD 06/19/2024 Active Family Member  rosuvastatin  (CRESTOR ) 20 MG tablet [id number] No TAKE 1 TABLET(20 MG) BY MOUTH DAILY Norleen Lynwood ORN, MD 06/18/2024 Evening Active Family Member  tizanidine  (ZANAFLEX ) 6 MG capsule [id number] No TAKE 1 CAPSULE(6 MG) BY MOUTH THREE TIMES DAILY Norleen Lynwood ORN, MD 06/19/2024 Active Family Member  torsemide  (DEMADEX ) 20 MG tablet  [id number]  Take 1 tablet (20 mg total) by mouth daily. Cheryle Page, MD  Active   Ubrogepant  (UBRELVY ) 100 MG TABS [id number] No Take 1 tablet (100 mg total) by mouth as needed (take 1 tablet at onset of migraine. may take an additional tablet 2 hrs later if needed (Do not exceed more than 2 tablets in 24 hour period)). Dohmeier, Dedra, MD Unknown Active Family Member  VENTOLIN  HFA 108 [phone]) MCG/ACT inhaler [id number] No INHALE 2 PUFFS INTO THE LUNGS EVERY 6 HOURS AS NEEDED FOR WHEEZING OR SHORTNESS OF SHERIDA Norleen Lynwood ORN, MD 06/19/2024 Active Family Member  Patient not taking:  Discontinued 05/19/19 0129 Med List Note Dori Bobbette BIRCH, RN 01/16/17 0128): Pt's sister April Verdon helps with medications               Home Care and Equipment/Supplies: Were Home Health Services Ordered?: NA Any new equipment or medical supplies ordered?: NA  Functional Questionnaire: Do you need assistance with bathing/showering or dressing?: No Do you need assistance with meal preparation?: Yes Do you need assistance with eating?: No Do you have difficulty maintaining continence: No Do you need assistance with getting out of bed/getting out of a chair/moving?: No Do you have difficulty managing or taking your medications?: Yes  Follow up appointments reviewed: PCP Follow-up appointment confirmed?: Yes Date of PCP follow-up appointment?: 07/04/24 Follow-up Provider: Dr. Norleen Specialist [facility] Follow-up appointment confirmed?: Yes Date of Specialist follow-up appointment?: 06/27/24 Follow-Up Specialty Provider:: Cardiology Do you need transportation to your follow-up appointment?: No Do you understand care options if your condition(s) worsen?: Yes-patient verbalized understanding    SIGNATURE Charmaine Bloodgood, LPN [facility] Health Advisor [place] [facility] You Are. We Are. [facility] Direct Dial [phone]  "

## 2024-06-23 NOTE — Telephone Encounter (Signed)
 Left message to call back.

## 2024-06-27 ENCOUNTER — Other Ambulatory Visit: Payer: Self-pay

## 2024-06-27 ENCOUNTER — Encounter: Payer: Self-pay | Admitting: Cardiology

## 2024-06-27 ENCOUNTER — Ambulatory Visit: Admitting: Cardiology

## 2024-06-27 ENCOUNTER — Other Ambulatory Visit (HOSPITAL_COMMUNITY): Payer: Self-pay

## 2024-06-27 VITALS — BP 123/74 | HR 70 | Ht 69.0 in | Wt 242.2 lb

## 2024-06-27 DIAGNOSIS — Z79899 Other long term (current) drug therapy: Secondary | ICD-10-CM

## 2024-06-27 DIAGNOSIS — R600 Localized edema: Secondary | ICD-10-CM | POA: Diagnosis not present

## 2024-06-27 DIAGNOSIS — I5030 Unspecified diastolic (congestive) heart failure: Secondary | ICD-10-CM

## 2024-06-27 MED ORDER — TORSEMIDE 20 MG PO TABS
20.0000 mg | ORAL_TABLET | Freq: Two times a day (BID) | ORAL | 1 refills | Status: DC
  Filled 2024-06-27: qty 180, 90d supply, fill #0

## 2024-06-27 MED ORDER — TORSEMIDE 20 MG PO TABS: 20.0000 mg | ORAL_TABLET | Freq: Two times a day (BID) | ORAL | 1 refills | Status: AC

## 2024-06-27 NOTE — Patient Instructions (Addendum)
 Medication Instructions:  Please INCREASE your dose of torsemide  to 20 mg twice a day.  *If you need a refill on your cardiac medications before your next appointment, please call your pharmacy*  Lab Work: Please complete a BMET and a proBNP at any [facility] in 2-3 weeks.  If you have labs (blood work) drawn today and your tests are completely normal, you will receive your results only by: MyChart Message (if you have MyChart) OR A paper copy in the mail If you have any lab test that is abnormal or we need to change your treatment, we will call you to review the results.  Testing/Procedures: Your physician has requested that you have an echocardiogram. Echocardiography is a painless test that uses sound waves to create images of your heart. It provides your doctor with information about the size and shape of your heart and how well your hearts chambers and valves are working. This procedure takes approximately one hour. There are no restrictions for this procedure. Please do NOT wear cologne, perfume, aftershave, or lotions (deodorant is allowed). Please arrive 15 minutes prior to your appointment time.  Please note: We ask at that you not bring children with you during ultrasound (echo/ vascular) testing. Due to room size and safety concerns, children are not allowed in the ultrasound rooms during exams. Our front office staff cannot provide observation of children in our lobby area while testing is being conducted. An adult accompanying a patient to their appointment will only be allowed in the ultrasound room at the discretion of the ultrasound technician under special circumstances. We apologize for any inconvenience.   Follow-Up: At [facility], you and your health needs are our priority.  As part of our continuing mission to provide you with exceptional heart care, our providers are all part of one team.  This team includes your primary Cardiologist (physician) and Advanced  Practice Providers or APPs (Physician Assistants and Nurse Practitioners) who all work together to provide you with the care you need, when you need it.  Your next appointment:   6 week(s)  Monday 08/19/23 at 1:40 PM  Provider:   Newman JINNY Lawrence, MD    We recommend signing up for the patient portal called MyChart.  Sign up information is provided on this After Visit Summary.  MyChart is used to connect with patients for Virtual Visits (Telemedicine).  Patients are able to view lab/test results, encounter notes, upcoming appointments, etc.  Non-urgent messages can be sent to your provider as well.   To learn more about what you can do with MyChart, go to [email or web address].

## 2024-06-27 NOTE — Progress Notes (Signed)
 " Cardiology Office Note:  .   Date:  06/27/2024  ID:  Casey Brennan, DOB 01/28/1961, MRN [id number] PCP: Norleen Casey ORN, MD  Casey Brennan Cardiologist:  Casey Lawrence, MD PCP: Norleen Casey ORN, MD  Chief Complaint  Patient presents with   Leg Swelling     Casey Brennan is a 63 y.o. male with hypertension, hyperlipidemia, HIV, Rt ICA siphon aneurysm     Discussed the use of AI scribe software for clinical note transcription with the patient, who gave verbal consent to proceed.  History of Present Illness Casey Brennan is a 63 year old male who presents for follow-up after recent hospitalization for leg swelling.  He was recently hospitalized for leg swelling and treated with IV diuretics with initial improvement. Since discharge he has felt sluggish. Leg swelling recurred Tuesday night and improved with oral torsemide  20 mg daily.  His weight was 242 lb before admission and 240 lb at discharge. He currently takes torsemide  20 mg daily, isosorbide  dinitrate 20 mg three times daily, metoprolol  100 mg daily, and rosuvastatin  20 mg daily.  He walks regularly and limits dietary salt, mainly eating fresh food at home.  He has wheezing and shortness of breath with bending over or exertion and feels winded. He denies current lightheadedness but had an episode a couple of weeks ago and reports feeling run down.  Patient was recent hospitalized with leg swelling and weight gain.  Responded well to IV diuresis with improvement in his weight from 248 pounds to 240 pounds.  Surprisingly, his proBNP was completely normal.     Vitals:   06/27/24 1441  BP: 123/74  Pulse: 70  SpO2: 93%      Review of Systems  Cardiovascular:  Positive for dyspnea on exertion and leg swelling. Negative for chest pain, palpitations and syncope.        Studies Reviewed: SABRA        EKG 10/01/2023: Normal sinus rhythm Normal ECG When compared with ECG of 07-Sep-2023  02:30, No significant change was found  cMRI 11/2023: 1.  Study is inconsistent with cardiac sarcoidosis. 2. Normal left ventricular systolic function (LVEF =55%). There are no regional wall motion abnormalities. 3.  Normal right ventricular systolic function (RVEF =50%).    CTA head/neck 09/2023: 1. 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 Neuro endovascular (Neuro-Interventional Radiology or endovascular neurosurgery) follow-up recommended to evaluate the appropriateness of potential treatment.   2. Otherwise negative CTA Head and Neck; no significant atherosclerosis or stenosis.   3. Stable and normal for age CT appearance of the brain.   4. Extensive anterior and posterior cervical spine, cervicothoracic junction fusion. Small volume probably postprocedural soft tissue gas in the upper neck. The appearance of significant underlying vertebral lucency on this exam might be artifact, hardware loosening not excluded.   Echocardiogram 03/2022: Normal LV systolic function with visual EF 60-65%. Left ventricle cavity is normal in size. Mild concentric hypertrophy of the left ventricle. Normal global wall motion. Normal diastolic filling pattern, normal LAP. Calculated EF 61%. Structurally normal tricuspid valve with no regurgitation. No evidence of pulmonary hypertension. compared to echo from 2021, EF has now normalized.  CCTA 04/07/2020: 1. Coronary calcium  score of 0. 2. Normal coronary origin with right dominance. 3. Normal coronary arteries.  Labs 1012/2025: Chol 130, TG 126, HDL 51, LDL 57 Hb 9.4 Cr 1.53, eGFR 51   Physical Exam Vitals and nursing note  reviewed.  Constitutional:      General: He is not in acute distress. Neck:     Vascular: No JVD.  Cardiovascular:     Rate and Rhythm: Normal rate and regular rhythm.     Heart sounds: Normal heart sounds. No murmur heard. Pulmonary:     Effort: Pulmonary effort  is normal.     Breath sounds: Normal breath sounds. No wheezing or rales.  Musculoskeletal:     Right lower leg: Edema (1+) present.     Left lower leg: Edema (1+) present.      VISIT DIAGNOSES:   ICD-10-CM   1. Medication management  Z79.899 Basic Metabolic Panel (BMET)    Pro b natriuretic peptide (BNP)    Pro b natriuretic peptide (BNP)    Basic Metabolic Panel (BMET)    2. Heart failure with preserved ejection fraction (HFpEF), unspecified HF chronicity (HCC)  I50.30 ECHOCARDIOGRAM COMPLETE    3. Leg edema  R60.0 Basic Metabolic Panel (BMET)    Pro b natriuretic peptide (BNP)    Pro b natriuretic peptide (BNP)    Basic Metabolic Panel (BMET)       Casey Brennan is a 63 y.o. male with hypertension, hyperlipidemia, HIV, Rt ICA siphon aneurysm  Assessment and Plan Assessment & Plan Heart failure with preserved ejection fraction: Bilateral leg swelling and shortness of breath improved with diuretics.  Surprisingly normal proBNP.  Evidence signs are normal as well.  However, in absence of any other etiology, I do suspect that fluid retention likely due to heart failure with preserved ejection fraction. Jardiance avoided due to UTI risk. Spironolactone not considered due to elevated creatinine. - Increased torsemide  to 20 mg twice a day. - Ordered echocardiogram to assess cardiac function. - Monitor weight regularly at home, aiming to stay around 240 lbs. - Check blood work in two weeks. - Follow up in six weeks.  Chronic kidney disease: Elevated creatinine levels (1.4 to 1.5) with baseline around 1.2 to 1.3. Spironolactone not considered due to elevated creatinine. - Monitor kidney function with blood work in two weeks.    Meds ordered this encounter  Medications   DISCONTD: torsemide  (DEMADEX ) 20 MG tablet    Sig: Take 1 tablet (20 mg total) by mouth 2 (two) times daily.    Dispense:  180 tablet    Refill:  1   torsemide  (DEMADEX ) 20 MG tablet    Sig: Take 1  tablet (20 mg total) by mouth 2 (two) times daily.    Dispense:  180 tablet    Refill:  1     F/u in 6 weeks  Signed, Casey JINNY Lawrence, MD  "

## 2024-06-30 ENCOUNTER — Other Ambulatory Visit: Payer: Self-pay

## 2024-07-04 ENCOUNTER — Encounter: Payer: Self-pay | Admitting: Internal Medicine

## 2024-07-04 ENCOUNTER — Ambulatory Visit: Admitting: Internal Medicine

## 2024-07-04 VITALS — BP 124/80 | HR 70 | Temp 98.2°F | Ht 69.0 in | Wt 240.0 lb

## 2024-07-04 DIAGNOSIS — M899 Disorder of bone, unspecified: Secondary | ICD-10-CM

## 2024-07-04 DIAGNOSIS — I5030 Unspecified diastolic (congestive) heart failure: Secondary | ICD-10-CM

## 2024-07-04 DIAGNOSIS — J449 Chronic obstructive pulmonary disease, unspecified: Secondary | ICD-10-CM

## 2024-07-04 NOTE — Assessment & Plan Note (Signed)
 Incidental, has seen Dr Lonn in past for ? Bony lesion left hip negative for multiple myeloma, but now for MRI LS spine, and consider f/u with Dr Clayborne oncology

## 2024-07-04 NOTE — Progress Notes (Signed)
 Patient ID: Casey Brennan, male   DOB: 01/07/1961, 64 y.o.   MRN: [id number]        Chief Complaint: follow up post hospn dec 18 - 20 2025 with dCHF, copd exacerbation and indeterminate L3 lucent lesion       HPI:  Casey Brennan is a 64 y.o. male here overall doing ok now on torsemide  20 mg every day, has already seen cardiology and torsemide  increased to 20 bid, also seen per pulmonary with improved mild exacerbation copd, has f/u appt with Dr Tobie renal with labs soon Pt denies chest pain, increased sob or doe, wheezing, orthopnea, PND, increased LE swelling, palpitations, dizziness or syncope.   Pt denies polydipsia, polyuria, or new focal neuro s/s.  Did have L3 bone lesion noted on CT, will need f/u MRI.    Wt Readings from Last 3 Encounters:  07/04/24 240 lb (108.9 kg)  06/27/24 242 lb 3.2 oz (109.9 kg)  06/21/24 239 lb 10.2 oz (108.7 kg)   BP Readings from Last 3 Encounters:  07/04/24 124/80  06/27/24 123/74  06/21/24 122/74         Past Medical History:  Diagnosis Date   Allergy    numerous food and drug allergies and hypersensitivities including hives and throat swelling for many substances, carries epipen    Arthritis    Asthma    very rare   Blood transfusion without reported diagnosis 07/2010   Chronic anemia    normocytic   Chronic folliculitis    Chronic kidney disease    Stage 2   COPD (chronic obstructive pulmonary disease) (HCC)    Dyspnea    Environmental and seasonal allergies    GERD (gastroesophageal reflux disease)    Glaucoma    right   H/O pericarditis    2010--  myopercarditis--  resolved   Headache(784.0)    HX SEVERE FRONTAL HA'S   Hiatal hernia    History of gastric ulcer    nonbleeding gastric ulcer 58//24   History of kidney stones    History of MRSA infection 2010   infected boil   HIV (human immunodeficiency virus infection) (HCC) 1988   Hypertension    Hypertensive cardiomyopathy (HCC)    EF 37%, normal coronaries 04/2020 CCTA;  LVEF 60-65% 03/2022 echo   Lytic bone lesion of hip    WORK-UP DONE BY ONCOLOGIST DR HA --  NOT MALIGNANT   Nausea & vomiting 09/04/2023   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   Pneumonia    hx of   Post concussion syndrome    resolved   Prostate cancer (HCC) 04/25/2013   gleason 3+3=6, volume 30 gm   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   CARDIAC CATHETERIZATION  03-23-2009  DR COOPER   NORMAL CORONARY ARTERIES   CARPAL TUNNEL RELEASE Right 08/09/2023   Procedure: RIGHT CARPAL TUNNEL RELEASE;  Surgeon: Murrell Drivers, MD;  Location: [facility];  Service: Orthopedics;  Laterality: Right;  75 MIN   CHOLECYSTECTOMY N/A 11/06/2014   Procedure: LAPAROSCOPIC CHOLECYSTECTOMY WITH INTRAOPERATIVE CHOLANGIOGRAM;  Surgeon: Lynwood Pina, MD;  Location: [facility] OR;  Service: General;  Laterality: N/A;   COLONOSCOPY  12/26/2011   Procedure: COLONOSCOPY;  Surgeon: Jerrell KYM Sol, MD;  Location: [facility];  Service: Endoscopy;  Laterality: N/A;   COLONOSCOPY     COLONOSCOPY WITH PROPOFOL  N/A 07/29/2014   Procedure: COLONOSCOPY WITH PROPOFOL ;  Surgeon: Jerrell KYM Sol, MD;  Location[facility];  Service: Endoscopy;  Laterality: N/A;   CYSTOSCOPY N/A 05/18/2018   Procedure: CYSTOSCOPY REMOVAL FOREIGN BODY;  Surgeon: Ottelin, Mark, MD;  Location: [facility]S;  Service: Urology;  Laterality: N/A;   CYSTOSCOPY/RETROGRADE/URETEROSCOPY Bilateral 04/25/2013   Procedure:  CYSTOSCOPY/ BILATERAL RETROGRADES; BLADDER BIOPSIES;  Surgeon: Ricardo Likens, MD;  Location: [facility];  Service: Urology;  Laterality: Bilateral;   DENTAL EXAMINATION UNDER ANESTHESIA     ESOPHAGOGASTRODUODENOSCOPY  12/26/2011   Procedure: ESOPHAGOGASTRODUODENOSCOPY (EGD);  Surgeon: Jerrell KYM Sol, MD;  Location: [facility];  Service: Endoscopy;  Laterality: N/A;   ESOPHAGOGASTRODUODENOSCOPY (EGD) WITH PROPOFOL  N/A 07/29/2014   Procedure: ESOPHAGOGASTRODUODENOSCOPY (EGD) WITH PROPOFOL ;  Surgeon: Jerrell KYM Sol, MD;  Location: [facility];  Service: Endoscopy;  Laterality: N/A;   EXCISION CHRONIC LEFT BREAST ABSCESS  09/21/2010   EXCISIONAL BX LEFT BREAST MASS/  I  &  D LEFT BREAST ABSCESS  03/24/2009   IR ANGIO INTRA EXTRACRAN SEL INTERNAL CAROTID BILAT MOD SED  10/23/2023   IR ANGIO VERTEBRAL SEL VERTEBRAL BILAT MOD SED  10/23/2023   JOINT REPLACEMENT  08/2016   KNEE ARTHROSCOPY Right 1985   left axilla biopsy  04/2013   left hip biopsy  10/2012   LYMPHADENECTOMY Bilateral 08/18/2013   Procedure: LYMPHADENECTOMY PELVIC LYMPH NODE DISSECTION;  Surgeon: Ricardo Likens, MD;  Location: [facility];  Service: Urology;  Laterality: Bilateral;   POSTERIOR CERVICAL FUSION/FORAMINOTOMY N/A 11/30/2022   Procedure: POSTERIOR DECOMPRESSION AND FUSION CERVICAL 4- CERVICAL 5, CERVICAL 5- CERVICAL 6, CERVICAL 6- CERVICAL 7, CERVICAL 7- THORACIC 1 WITH INSTRUMENTATION AND ALLOGRAFT;  Surgeon: Beuford Anes, MD;  Location: [facility] OR;  Service: Orthopedics;  Laterality: N/A;   PROSTATE BIOPSY N/A 04/25/2013   Procedure: BIOPSY TRANSRECTAL ULTRASONIC PROSTATE (TUBP);  Surgeon: Ricardo Likens, MD;  Location: [facility];  Service: Urology;  Laterality: N/A;   ROBOT ASSISTED LAPAROSCOPIC RADICAL PROSTATECTOMY N/A 08/18/2013   Procedure: ROBOTIC ASSISTED LAPAROSCOPIC RADICAL PROSTATECTOMY;  Surgeon: Ricardo Likens, MD;  Location: [facility];  Service: Urology;  Laterality: N/A;    SHOULDER ARTHROSCOPY WITH ROTATOR CUFF REPAIR AND SUBACROMIAL DECOMPRESSION Left 03/13/2023   Procedure: SHOULDER ARTHROSCOPY WITH ROTATOR CUFF REPAIR AND SUBACROMIAL DECOMPRESSION, DISTAL CLAVICLECTOMY, BICEPS TENODESIS;  Surgeon: Beverley Evalene BIRCH, MD;  Location: [facility];  Service: Orthopedics;  Laterality: Left;   TOTAL KNEE ARTHROPLASTY Right 08/01/2016   08/17/16   TOTAL KNEE ARTHROPLASTY Right 08/01/2016   Procedure: TOTAL KNEE ARTHROPLASTY;  Surgeon: Evalene BIRCH Beverley, MD;  Location: [facility];  Service: Orthopedics;  Laterality: Right;   ULNAR NERVE TRANSPOSITION Right 08/09/2023   Procedure: RIGHT ULNAR NERVE DECOMPRESSION AT ELBOW, POSSIBLE TRANSPOSITION;  Surgeon: Murrell Drivers, MD;  Location: [facility];  Service: Orthopedics;  Laterality: Right;   UPPER GASTROINTESTINAL ENDOSCOPY     UPPER LEG SOFT TISSUE BIOPSY Left 2012   lthigh    reports that he quit smoking about 14 years ago. His smoking use included cigars and cigarettes. He started smoking about 37 years ago. He has  a 34.5 pack-year smoking history. He has never used smokeless tobacco. He reports that he does not currently use alcohol . He reports that he does not use drugs. family history includes Asthma in his father, sister, and sister; Cancer in his father; Diabetes in his father and mother; Hypertension in his sister; Leukemia in his mother; Prostate cancer in his maternal uncle; Stroke in his father. Allergies[1] Medications Ordered Prior to Encounter[2]      ROS:  All others reviewed and negative.  Objective        PE:  BP 124/80 (BP Location: Right Arm, Patient Position: Sitting, Cuff Size: Normal)   Pulse 70   Temp 98.2 F (36.8 C) (Oral)   Ht 5' 9 (1.753 m)   Wt 240 lb (108.9 kg)   SpO2 96%   BMI 35.44 kg/m                 Constitutional: Pt appears in NAD               HENT: Head: NCAT.                Right Ear: External ear normal.                 Left Ear: External ear  normal.                Eyes: . Pupils are equal, round, and reactive to light. Conjunctivae and EOM are normal               Nose: without d/c or deformity               Neck: Neck supple. Gross normal ROM               Cardiovascular: Normal rate and regular rhythm.                 Pulmonary/Chest: Effort normal and breath sounds without rales or wheezing.                Abd:  Soft, NT, ND, + BS, no organomegaly               Neurological: Pt is alert. At baseline orientation, motor grossly intact; nontender lumbar spine in midline               Skin: Skin is warm. No rashes, no other new lesions, LE edema - none               Psychiatric: Pt behavior is normal without agitation   Micro: none  Cardiac tracings I have personally interpreted today:  none  Pertinent Radiological findings (summarize): none   Lab Results  Component Value Date   WBC 5.2 06/20/2024   HGB 9.4 (L) 06/20/2024   HCT 30.3 (L) 06/20/2024   PLT 156 06/20/2024   GLUCOSE 116 (H) 06/21/2024   CHOL 130 04/09/2024   TRIG 126 04/09/2024   HDL 51 04/09/2024   LDLCALC 57 04/09/2024   ALT 22 06/19/2024   AST 31 06/19/2024   NA 138 06/21/2024   K 4.2 06/21/2024   CL 103 06/21/2024   CREATININE 1.53 (H) 06/21/2024   BUN 18 06/21/2024   CO2 28 06/21/2024   TSH 2.51 08/14/2022   PSA 0.00 (L) 08/14/2022   INR 1.0 10/23/2023   HGBA1C <4.2 08/15/2022   MICROALBUR 16.88 (H) 10/05/2011   Assessment/Plan:  DRU PRIMEAU is a 64 y.o. American Indian or [place]  Native [3]  Asian [4] Black or African American [2] White or Caucasian [1] male with  has a past medical history of Allergy, Arthritis, Asthma, Blood transfusion without reported diagnosis (07/2010), Chronic anemia, Chronic folliculitis, Chronic kidney disease, COPD (chronic obstructive pulmonary disease) (HCC), Dyspnea, Environmental and seasonal allergies, GERD (gastroesophageal reflux disease), Glaucoma, H/O pericarditis, Headache(784.0), Hiatal hernia, History  of gastric ulcer, History of kidney stones, History of MRSA infection (2010), HIV (human immunodeficiency virus infection) (HCC) (1988), Hypertension, Hypertensive cardiomyopathy (HCC), Lytic bone lesion of hip, Nausea & vomiting (09/04/2023), 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  Heart failure with preserved ejection fraction (HFpEF) (HCC) Stable overall, cont torsemide  20 bid, f/u cardiology as planned  COPD GOLD III Mild exacerbation resolved, cont current inhaler  Lesion of bone of lumbosacral spine Incidental, has seen Dr Lonn in past for ? Bony lesion left hip negative for multiple myeloma, but now for MRI LS spine, and consider f/u with Dr Clayborne oncology  Followup: Return if symptoms worsen or fail to improve.  Lynwood Rush, MD 07/04/2024 12:35 PM [facility] Internal Medicine     [1]  Allergies Allergen Reactions   Amoxapine Shortness Of Breath   Chocolate Shortness Of Breath and Other (See Comments)    Asthma attack, welts   Ciprofloxacin  Anaphylaxis, Hives, Shortness Of Breath, Itching, Nausea And Vomiting, Swelling and Dermatitis   Descovy  [Emtricitabine -Tenofovir  Af] Shortness Of Breath    Nasuea, vomiiting, diarrhea, sob, whelps, throat closes   Doxycycline  Hyclate Shortness Of Breath and Swelling   Genvoya  [Elviteg-Cobic-Emtricit-Tenofaf] Shortness Of Breath    Nausea, vomitting, diarreha, sob, rash, throat closing   Milk-Related Compounds Other (See Comments)    Throat closing   Morphine  And Codeine Shortness Of Breath, Itching and Other (See Comments)     chest pain, hallucinations   Oxycodone -Acetaminophen  Shortness Of Breath   Penicillins Shortness Of Breath and Rash    Has patient had a PCN reaction causing immediate rash, facial/tongue/throat  swelling, SOB or lightheadedness with hypotension: Yes Has patient had a PCN reaction causing severe rash involving mucus membranes or skin necrosis: No Has patient had a PCN reaction that required hospitalization No Has patient had a PCN reaction occurring within the last 10 years: Yes If all of the above answers are NO, then may proceed with Cephalosporin use.   Sulfa Antibiotics Anaphylaxis and Other (See Comments)   Tomato Shortness Of Breath   Vancomycin  Shortness Of Breath and Other (See Comments)   Amlodipine  Other (See Comments)    swelling   Carvedilol  Other (See Comments)    LEG SWELLING, DIZZNESS   Darunavir  Itching   Meloxicam  Hives   Acetaminophen  Rash and Other (See Comments)    Pt tolerates percocet and also plain APAP without rash   Flagyl  [Metronidazole ] Itching   Hydralazine  Other (See Comments)    Dizziness and leg swelling    Reglan  [Metoclopramide ]     Tremors   Toradol  [Ketorolac  Tromethamine ] Nausea And Vomiting    itching   Tramadol  Itching, Nausea And Vomiting and Other (See Comments)  [2]  Current Outpatient Medications on File Prior to Visit  Medication Sig Dispense Refill   albuterol  (PROVENTIL ) (2.5 MG/3ML) 0.083% nebulizer solution USE 1 VIAL VIA NEBULIZER EVERY 6 HOURS AS NEEDED FOR WHEEZING OR SHORTNESS OF BREATH 75 mL 3   ALPRAZolam  (XANAX ) 0.5 MG tablet TAKE 1/2  TABLET(0.25 MG) BY MOUTH TWICE DAILY AS NEEDED FOR SLEEP (Patient taking differently: Take 0.25 mg by mouth at bedtime.) 30 tablet 2   azelastine  (ASTELIN ) 0.1 % nasal spray Place 2 sprays into both nostrils in the morning. Use in each nostril as directed     beclomethasone (QVAR  REDIHALER) 40 MCG/ACT inhaler Inhale 2 puffs into the lungs 2 (two) times daily. 1 each 5   bictegravir-emtricitabine -tenofovir  AF (BIKTARVY ) 50-200-25 MG TABS tablet Take 1 tablet by mouth daily. 90 tablet 3   budesonide -glycopyrrolate -formoterol  (BREZTRI  AEROSPHERE) 160-9-4.8 MCG/ACT AERO inhaler INHALE 2 PUFFS  INTO THE LUNGS IN THE MORNING AND AT BEDTIME 10.7 g 11   cetirizine  (ZYRTEC ) 10 MG tablet TAKE 1 TABLET(10 MG) BY MOUTH DAILY 90 tablet 3   Cholecalciferol  (VITAMIN D3) 250 MCG (10000 UT) capsule Take 1 tablet by mouth daily. [employer]'s Made     diltiazem  (CARDIZEM ) 30 MG tablet Take 1 tablet (30 mg total) by mouth 4 (four) times daily as needed (for heart racing/palpitations). 120 tablet 1   diphenhydrAMINE  (BENADRYL ) 25 MG tablet Take 1 tablet (25 mg total) by mouth every 6 (six) hours as needed. 30 tablet 0   dorzolamide  (TRUSOPT ) 2 % ophthalmic solution Place 1 drop into both eyes 2 (two) times daily.     EPINEPHrine  0.3 mg/0.3 mL IJ SOAJ injection INJECT 1 PEN IN THE MUSCLE ONE TIME AS DIRECTED AS NEEDED FOR ANAPHYLAXIS 2 each 3   famotidine  (PEPCID ) 40 MG tablet Take 1 tablet (40 mg total) by mouth at bedtime. 90 tablet 1   gabapentin  (NEURONTIN ) 300 MG capsule Use one in AM and 2 at bedtime for neuralgia 120 capsule 4   Glucos-Chond-Hyal Ac-Ca Fructo (MOVE FREE JOINT HEALTH ADVANCE PO) Take 1 tablet by mouth daily.     HYDROcodone -acetaminophen  (NORCO/VICODIN) 5-325 MG tablet 1-2 tabs PO q6 hours prn pain (Patient taking differently: Take 1 tablet by mouth daily. 1-2 tabs PO q6 hours prn pain) 20 tablet 0   hydrOXYzine  (ATARAX ) 50 MG tablet Take 1 tablet (50 mg total) by mouth at bedtime.     isosorbide  dinitrate (ISORDIL ) 20 MG tablet Take 1 tablet (20 mg total) by mouth 3 (three) times daily. 270 tablet 1   meclizine  (ANTIVERT ) 25 MG tablet TAKE 1 TABLET(25 MG) BY MOUTH DAILY AS NEEDED 40 tablet 5   methocarbamol  (ROBAXIN ) 500 MG tablet Take 1-2 tablets (500-1,000 mg total) by mouth every 6 (six) hours as needed for muscle spasms. 30 tablet 2   metoprolol  succinate (TOPROL -XL) 100 MG 24 hr tablet Take 1 tablet (100 mg total) by mouth daily. Take with or immediately following a meal. 90 tablet 3   montelukast  (SINGULAIR ) 10 MG tablet Take 10 mg by mouth daily.     MOTEGRITY  2 MG TABS Take 1  tablet (2 mg total) by mouth daily. PHARMACY-PLEASE PROVIDE NAME BRAND MOTEGRITY  AS THIS IS COVERED BY PT INSURANCE. THANK YOU 90 tablet 1   Multiple Vitamin (MULTIVITAMIN WITH MINERALS) TABS tablet Take 1 tablet by mouth daily after breakfast.      naloxegol  oxalate (MOVANTIK ) 25 MG TABS tablet TAKE 1 TABLET(25 MG) BY MOUTH DAILY 30 tablet 2   ondansetron  (ZOFRAN -ODT) 4 MG disintegrating tablet Take 1 tablet (4 mg total) by mouth every 8 (eight) hours as needed for nausea or vomiting. 30 tablet 2   OVER THE COUNTER MEDICATION Take 2 capsules by mouth daily. Zhou: thyroid  supplement     pantoprazole  (PROTONIX ) 40 MG tablet TAKE 1 TABLET(40 MG) BY  MOUTH TWICE DAILY BEFORE A MEAL 180 tablet 3   polyethylene glycol (MIRALAX  / GLYCOLAX ) 17 g packet Take 17 g by mouth daily as needed for mild constipation. 14 each 0   predniSONE  (DELTASONE ) 10 MG tablet Take 2 tabs daily with food x 5ds, then 1 tab daily with food x 5ds then STOP 20 tablet 0   QNASL  80 MCG/ACT AERS USE 2 SPRAYS IN EACH NOSTRIL DAILY 10.6 g 2   rosuvastatin  (CRESTOR ) 20 MG tablet TAKE 1 TABLET(20 MG) BY MOUTH DAILY 90 tablet 3   tizanidine  (ZANAFLEX ) 6 MG capsule TAKE 1 CAPSULE(6 MG) BY MOUTH THREE TIMES DAILY 90 capsule 3   Ubrogepant  (UBRELVY ) 100 MG TABS Take 1 tablet (100 mg total) by mouth as needed (take 1 tablet at onset of migraine. may take an additional tablet 2 hrs later if needed (Do not exceed more than 2 tablets in 24 hour period)). 16 tablet 5   VENTOLIN  HFA 108 (90 Base) MCG/ACT inhaler INHALE 2 PUFFS INTO THE LUNGS EVERY 6 HOURS AS NEEDED FOR WHEEZING OR SHORTNESS OF BREATH 54 g 0   cefdinir (OMNICEF) 300 MG capsule Take 1 capsule (300 mg total) by mouth 2 (two) times daily. (Patient not taking: Reported on 07/04/2024) 14 capsule 0   nitroGLYCERIN  (NITROSTAT ) 0.4 MG SL tablet Place 1 tablet (0.4 mg total) under the tongue every 5 (five) minutes as needed for chest pain. 30 tablet 1   torsemide  (DEMADEX ) 20 MG tablet Take 1  tablet (20 mg total) by mouth 2 (two) times daily. 180 tablet 1   [DISCONTINUED] zolpidem  (AMBIEN ) 5 MG tablet Take 1-2 tablets (5-10 mg total) by mouth at bedtime as needed for sleep. (Patient not taking: Reported on 06/27/2024) 30 tablet 2   No current facility-administered medications on file prior to visit.

## 2024-07-04 NOTE — Assessment & Plan Note (Signed)
 Mild exacerbation resolved, cont current inhaler

## 2024-07-04 NOTE — Assessment & Plan Note (Signed)
 Stable overall, cont torsemide  20 bid, f/u cardiology as planned

## 2024-07-04 NOTE — Patient Instructions (Addendum)
 Please continue all other medications as before, and refills have been done if requested.  Please have the pharmacy call with any other refills you may need.  Please continue your efforts at being more active, low cholesterol diet, and weight control.  Please keep your appointments with your specialists as you may have planned - cardiology, pulmonary and renal with blood tests as planned, and Infectious Disease  You will be contacted regarding the referral for: LS Spine MRI (lower back MRI)  We can hold on more lab test today  You are given the jury note today

## 2024-07-08 ENCOUNTER — Inpatient Hospital Stay: Admission: RE | Admit: 2024-07-08 | Discharge: 2024-07-08 | Attending: Internal Medicine | Admitting: Internal Medicine

## 2024-07-08 DIAGNOSIS — M899 Disorder of bone, unspecified: Secondary | ICD-10-CM

## 2024-07-09 ENCOUNTER — Other Ambulatory Visit

## 2024-07-09 ENCOUNTER — Other Ambulatory Visit: Payer: Self-pay | Admitting: Infectious Diseases

## 2024-07-09 ENCOUNTER — Other Ambulatory Visit (HOSPITAL_COMMUNITY)
Admission: RE | Admit: 2024-07-09 | Discharge: 2024-07-09 | Disposition: A | Discharge status: Home / Self Care | Source: Ambulatory Visit | Attending: Infectious Diseases | Admitting: Infectious Diseases

## 2024-07-09 DIAGNOSIS — Z79899 Other long term (current) drug therapy: Secondary | ICD-10-CM

## 2024-07-09 DIAGNOSIS — Z113 Encounter for screening for infections with a predominantly sexual mode of transmission: Secondary | ICD-10-CM | POA: Insufficient documentation

## 2024-07-09 DIAGNOSIS — B20 Human immunodeficiency virus [HIV] disease: Secondary | ICD-10-CM

## 2024-07-09 NOTE — Addendum Note (Signed)
 Addended by: ANTONE DWAYNE SAILOR on: 07/09/2024 03:01 PM   Modules accepted: Orders

## 2024-07-10 ENCOUNTER — Encounter: Payer: Self-pay | Admitting: Internal Medicine

## 2024-07-10 ENCOUNTER — Ambulatory Visit: Admitting: Internal Medicine

## 2024-07-10 VITALS — BP 136/78 | HR 64 | Temp 98.0°F | Ht 69.0 in | Wt 244.0 lb

## 2024-07-10 DIAGNOSIS — E782 Mixed hyperlipidemia: Secondary | ICD-10-CM | POA: Diagnosis not present

## 2024-07-10 DIAGNOSIS — I10 Essential (primary) hypertension: Secondary | ICD-10-CM

## 2024-07-10 DIAGNOSIS — J449 Chronic obstructive pulmonary disease, unspecified: Secondary | ICD-10-CM

## 2024-07-10 DIAGNOSIS — Z0000 Encounter for general adult medical examination without abnormal findings: Secondary | ICD-10-CM

## 2024-07-10 DIAGNOSIS — E559 Vitamin D deficiency, unspecified: Secondary | ICD-10-CM

## 2024-07-10 DIAGNOSIS — Z0001 Encounter for general adult medical examination with abnormal findings: Secondary | ICD-10-CM

## 2024-07-10 LAB — T-HELPER CELLS (CD4) COUNT (NOT AT ARMC)
CD4 % Helper T Cell: 21 % — ABNORMAL LOW (ref 33–65)
CD4 T Cell Abs: 412 /uL (ref 400–1790)

## 2024-07-10 LAB — URINE CYTOLOGY ANCILLARY ONLY
Chlamydia: NEGATIVE
Comment: NEGATIVE
Comment: NORMAL
Neisseria Gonorrhea: NEGATIVE

## 2024-07-10 NOTE — Progress Notes (Signed)
 Patient ID: BRITT PETRONI, male   DOB: 04-29-1961, 64 y.o.   MRN: [id number]         Chief Complaint:: wellness exam and low vit d, hld, htn, copd       HPI:  CHARES SLAYMAKER is a 64 y.o. male here for wellness exam; for tdap at pharmacy, declines hep B and covid boosters                        Also Pt denies chest pain, increased sob or doe, wheezing, orthopnea, PND, increased LE swelling, palpitations, dizziness or syncope.  Pt denies polydipsia, polyuria, or new focal neuro s/s.    Pt denies fever, wt loss, night sweats, loss of appetite, or other constitutional symptoms     Wt Readings from Last 3 Encounters:  07/10/24 244 lb (110.7 kg)  07/04/24 240 lb (108.9 kg)  06/27/24 242 lb 3.2 oz (109.9 kg)   BP Readings from Last 3 Encounters:  07/10/24 136/78  07/04/24 124/80  06/27/24 123/74   Immunization History  Administered Date(s) Administered   Hepatitis A 06/12/2011   INFLUENZA, HIGH DOSE SEASONAL PF 06/02/2021   Influenza Split 06/12/2012   Influenza Whole 04/19/2009, 07/25/2010, 02/22/2011   Influenza,inj,Quad PF,6+ Mos 06/04/2013, 04/27/2014, 07/21/2015, 05/22/2016, 04/27/2017, 05/20/2020, 04/06/2022   Influenza-Unspecified 01/30/2015   Meningococcal Mcv4o 05/22/2016   PFIZER Comirnaty(Gray Top)Covid-19 Tri-Sucrose Vaccine 05/25/2020   PFIZER(Purple Top)SARS-COV-2 Vaccination 10/23/2019, 11/13/2019, 05/25/2020   PPD Test 05/05/2009   Pfizer Covid-19 Vaccine Bivalent Booster 83yrs & up 04/08/2021   Pneumococcal Conjugate-13 11/11/2020   Pneumococcal Polysaccharide-23 03/22/2009, 08/19/2013, 11/15/2020   Tdap 06/12/2011   Zoster Recombinant(Shingrix ) 12/27/2020, 04/06/2022, 06/14/2022   Health Maintenance Due  Topic Date Due   DTaP/Tdap/Td (2 - Td or Tdap) 06/11/2021   Hepatitis B Vaccines 19-59 Average Risk (1 of 3 - Risk 3-dose series) Never done   COVID-19 Vaccine (6 - 2025-26 season) 03/03/2024      Past Medical History:  Diagnosis Date   Allergy     numerous food and drug allergies and hypersensitivities including hives and throat swelling for many substances, carries epipen    Arthritis    Asthma    very rare   Blood transfusion without reported diagnosis 07/2010   Chronic anemia    normocytic   Chronic folliculitis    Chronic kidney disease    Stage 2   COPD (chronic obstructive pulmonary disease) (HCC)    Dyspnea    Environmental and seasonal allergies    GERD (gastroesophageal reflux disease)    Glaucoma    right   H/O pericarditis    2010--  myopercarditis--  resolved   Headache(784.0)    HX SEVERE FRONTAL HA'S   Hiatal hernia    History of gastric ulcer    nonbleeding gastric ulcer 58//24   History of kidney stones    History of MRSA infection 2010   infected boil   HIV (human immunodeficiency virus infection) (HCC) 1988   Hypertension    Hypertensive cardiomyopathy (HCC)    EF 37%, normal coronaries 04/2020 [facility]; LVEF 60-65% 03/2022 echo   Lytic bone lesion of hip    WORK-UP DONE BY ONCOLOGIST DR HA --  NOT MALIGNANT   Nausea & vomiting 09/04/2023   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   Pneumonia    hx of   Post concussion syndrome    resolved   Prostate cancer (HCC) 04/25/2013   gleason 3+3=6, volume 30 gm  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   CARDIAC CATHETERIZATION  03-23-2009  DR COOPER   NORMAL CORONARY ARTERIES   CARPAL TUNNEL RELEASE Right 08/09/2023   Procedure: RIGHT CARPAL  TUNNEL RELEASE;  Surgeon: Murrell Drivers, MD;  Location: [facility];  Service: Orthopedics;  Laterality: Right;  75 MIN   CHOLECYSTECTOMY N/A 11/06/2014   Procedure: LAPAROSCOPIC CHOLECYSTECTOMY WITH INTRAOPERATIVE CHOLANGIOGRAM;  Surgeon: Lynwood Pina, MD;  Location: [facility] OR;  Service: General;  Laterality: N/A;   COLONOSCOPY  12/26/2011   Procedure: COLONOSCOPY;  Surgeon: Jerrell KYM Sol, MD;  Location: [facility];  Service: Endoscopy;  Laterality: N/A;   COLONOSCOPY     COLONOSCOPY WITH PROPOFOL  N/A 07/29/2014   Procedure: COLONOSCOPY WITH PROPOFOL ;  Surgeon: Jerrell KYM Sol, MD;  Location: [facility];  Service: Endoscopy;  Laterality: N/A;   CYSTOSCOPY N/A 05/18/2018   Procedure: CYSTOSCOPY REMOVAL FOREIGN BODY;  Surgeon: Ottelin, Mark, MD;  Location: [facility]S;  Service: Urology;  Laterality: N/A;   CYSTOSCOPY/RETROGRADE/URETEROSCOPY Bilateral 04/25/2013   Procedure: CYSTOSCOPY/ BILATERAL RETROGRADES; BLADDER BIOPSIES;  Surgeon: Ricardo Likens, MD;  Location: [facility];  Service: Urology;  Laterality: Bilateral;   DENTAL EXAMINATION UNDER ANESTHESIA     ESOPHAGOGASTRODUODENOSCOPY  12/26/2011   Procedure: ESOPHAGOGASTRODUODENOSCOPY (EGD);  Surgeon: Jerrell KYM Sol, MD;  Location: [facility];  Service: Endoscopy;  Laterality: N/A;   ESOPHAGOGASTRODUODENOSCOPY (EGD) WITH PROPOFOL  N/A 07/29/2014   Procedure: ESOPHAGOGASTRODUODENOSCOPY (EGD) WITH PROPOFOL ;  Surgeon: Jerrell KYM Sol, MD;  Location: [facility];  Service: Endoscopy;  Laterality: N/A;   EXCISION CHRONIC LEFT BREAST ABSCESS  09/21/2010   EXCISIONAL BX LEFT BREAST MASS/  I  &  D LEFT BREAST ABSCESS  03/24/2009   IR ANGIO INTRA EXTRACRAN SEL INTERNAL CAROTID BILAT MOD SED  10/23/2023   IR ANGIO VERTEBRAL SEL VERTEBRAL BILAT MOD SED  10/23/2023   JOINT REPLACEMENT  08/2016   KNEE ARTHROSCOPY Right 1985   left axilla biopsy  04/2013   left hip biopsy  10/2012   LYMPHADENECTOMY Bilateral  08/18/2013   Procedure: LYMPHADENECTOMY PELVIC LYMPH NODE DISSECTION;  Surgeon: Ricardo Likens, MD;  Location: [facility];  Service: Urology;  Laterality: Bilateral;   POSTERIOR CERVICAL FUSION/FORAMINOTOMY N/A 11/30/2022   Procedure: POSTERIOR DECOMPRESSION AND FUSION CERVICAL 4- CERVICAL 5, CERVICAL 5- CERVICAL 6, CERVICAL 6- CERVICAL 7, CERVICAL 7- THORACIC 1 WITH INSTRUMENTATION AND ALLOGRAFT;  Surgeon: Beuford Anes, MD;  Location: [facility] OR;  Service: Orthopedics;  Laterality: N/A;   PROSTATE BIOPSY N/A 04/25/2013   Procedure: BIOPSY TRANSRECTAL ULTRASONIC PROSTATE (TUBP);  Surgeon: Ricardo Likens, MD;  Location: [facility];  Service: Urology;  Laterality: N/A;   ROBOT ASSISTED LAPAROSCOPIC RADICAL PROSTATECTOMY N/A 08/18/2013   Procedure: ROBOTIC ASSISTED LAPAROSCOPIC RADICAL PROSTATECTOMY;  Surgeon: Ricardo Likens, MD;  Location: [facility];  Service: Urology;  Laterality: N/A;   SHOULDER ARTHROSCOPY WITH ROTATOR CUFF REPAIR AND SUBACROMIAL DECOMPRESSION Left 03/13/2023   Procedure: SHOULDER ARTHROSCOPY WITH ROTATOR CUFF REPAIR AND SUBACROMIAL DECOMPRESSION, DISTAL CLAVICLECTOMY,  BICEPS TENODESIS;  Surgeon: Beverley Evalene BIRCH, MD;  Location: [facility];  Service: Orthopedics;  Laterality: Left;   TOTAL KNEE ARTHROPLASTY Right 08/01/2016   08/17/16   TOTAL KNEE ARTHROPLASTY Right 08/01/2016   Procedure: TOTAL KNEE ARTHROPLASTY;  Surgeon: Evalene BIRCH Beverley, MD;  Location: [facility] OR;  Service: Orthopedics;  Laterality: Right;   ULNAR NERVE TRANSPOSITION Right 08/09/2023   Procedure: RIGHT ULNAR NERVE DECOMPRESSION AT ELBOW, POSSIBLE TRANSPOSITION;  Surgeon: Murrell Drivers, MD;  Location: [facility];  Service: Orthopedics;  Laterality: Right;   UPPER GASTROINTESTINAL ENDOSCOPY     UPPER LEG SOFT TISSUE BIOPSY Left 2012   lthigh    reports that he quit smoking about 14 years ago. His smoking use included cigars and cigarettes. He started smoking about 37 years ago.  He has a 34.5 pack-year smoking history. He has never used smokeless tobacco. He reports that he does not currently use alcohol . He reports that he does not use drugs. family history includes Asthma in his father, sister, and sister; Cancer in his father; Diabetes in his father and mother; Hypertension in his sister; Leukemia in his mother; Prostate cancer in his maternal uncle; Stroke in his father. Allergies[1] Medications Ordered Prior to Encounter[2]      ROS:  All others reviewed and negative.  Objective        PE:  BP 136/78 (BP Location: Right Arm, Patient Position: Sitting, Cuff Size: Normal)   Pulse 64   Temp 98 F (36.7 C) (Oral)   Ht 5' 9 (1.753 m)   Wt 244 lb (110.7 kg)   SpO2 95%   BMI 36.03 kg/m                 Constitutional: Pt appears in NAD               HENT: Head: NCAT.                Right Ear: External ear normal.                 Left Ear: External ear normal.                Eyes: . Pupils are equal, round, and reactive to light. Conjunctivae and EOM are normal               Nose: without d/c or deformity               Neck: Neck supple. Gross normal ROM               Cardiovascular: Normal rate and regular rhythm.                 Pulmonary/Chest: Effort normal and breath sounds without rales or wheezing.                Abd:  Soft, NT, ND, + BS, no organomegaly               Neurological: Pt is alert. At baseline orientation, motor grossly intact               Skin: Skin is warm. No rashes, no other new lesions, LE edema - none               Psychiatric: Pt behavior is normal without agitation   Micro: none  Cardiac tracings I have personally interpreted today:  none  Pertinent Radiological findings (summarize): none   Lab Results  Component Value Date   WBC 7.8 07/09/2024   HGB 11.3 (L) 07/09/2024   HCT 36.9 (L) 07/09/2024   PLT 201 07/09/2024   GLUCOSE 90 07/09/2024   CHOL 133 07/09/2024   TRIG 85 07/09/2024   HDL 69 07/09/2024   LDLCALC 48  07/09/2024   ALT 21 07/09/2024   AST 25 07/09/2024   NA 140 07/09/2024   K 4.1 07/09/2024   CL 106 07/09/2024   CREATININE 1.41 (H) 07/09/2024   BUN 13 07/09/2024   CO2 21 07/09/2024   TSH 2.51 08/14/2022   PSA 0.00 (L) 08/14/2022   INR 1.0 10/23/2023   HGBA1C <4.2 08/15/2022   MICROALBUR 16.88 (H) 10/05/2011   Assessment/Plan:  CHENEY EWART is a 64 y.o. American Indian or [place]  Native [3] Asian [4] Black or African American [2] White or Caucasian [1] male with  has a past medical history of Allergy, Arthritis, Asthma, Blood transfusion without reported diagnosis (07/2010), Chronic anemia, Chronic folliculitis, Chronic kidney disease, COPD (chronic obstructive pulmonary disease) (HCC), Dyspnea, Environmental and seasonal allergies, GERD (gastroesophageal reflux disease), Glaucoma, H/O pericarditis, Headache(784.0), Hiatal hernia, History of gastric ulcer, History of kidney stones, History of MRSA infection (2010), HIV (human immunodeficiency virus infection) (HCC) (1988), Hypertension, Hypertensive cardiomyopathy (HCC), Lytic bone lesion of hip, Nausea & vomiting (09/04/2023), 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  Encounter for well adult exam with abnormal findings Age and sex appropriate education and counseling updated with regular exercise and diet Referrals for preventative services - none needed Immunizations addressed - declines hep b and covid booster, for tdap at pharmacy Smoking counseling  - none needed Evidence for depression or other mood disorder - none significant Most recent labs reviewed. I have personally reviewed and have noted: 1) the patient's medical and social history 2) The patient's current medications and supplements 3) The patient's height, weight, and BMI have been recorded in the  chart   Vitamin D  deficiency Last vitamin D  Lab Results  Component Value Date   VD25OH 27.12 (L) 08/14/2022   Low, to start oral replacement   Mixed hyperlipidemia Lab Results  Component Value Date   LDLCALC 48 07/09/2024   Stable, pt to continue current statin crestor  20 mg qd   Essential hypertension BP Readings from Last 3 Encounters:  07/10/24 136/78  07/04/24 124/80  06/27/24 123/74   Stable, pt to continue medical treatment toprol  xl 100 mg qd   COPD GOLD III Stable, cont Inhaler prn  Followup: Return in about 6 months (around 01/07/2025).  Lynwood Rush, MD 07/10/2024 6:45 PM [facility] Internal Medicine     [1]  Allergies Allergen Reactions   Amoxapine Shortness Of Breath   Chocolate Shortness Of Breath and Other (See Comments)    Asthma attack, welts   Ciprofloxacin  Anaphylaxis, Hives, Shortness Of Breath, Itching, Nausea And Vomiting, Swelling and Dermatitis   Descovy  [Emtricitabine -Tenofovir  Af] Shortness Of Breath    Nasuea, vomiiting, diarrhea, sob, whelps, throat closes   Doxycycline  Hyclate Shortness Of Breath and Swelling   Genvoya  [Elviteg-Cobic-Emtricit-Tenofaf] Shortness Of Breath    Nausea, vomitting, diarreha, sob, rash, throat closing   Milk-Related Compounds Other (See Comments)    Throat closing   Morphine  And Codeine Shortness Of Breath, Itching and Other (See Comments)     chest pain, hallucinations   Oxycodone -Acetaminophen  Shortness Of Breath  Penicillins Shortness Of Breath and Rash    Has patient had a PCN reaction causing immediate rash, facial/tongue/throat swelling, SOB or lightheadedness with hypotension: Yes Has patient had a PCN reaction causing severe rash involving mucus membranes or skin necrosis: No Has patient had a PCN reaction that required hospitalization No Has patient had a PCN reaction occurring within the last 10 years: Yes If all of the above answers are NO,  then may proceed with Cephalosporin use.   Sulfa Antibiotics Anaphylaxis and Other (See Comments)   Tomato Shortness Of Breath   Vancomycin  Shortness Of Breath and Other (See Comments)   Amlodipine  Other (See Comments)    swelling   Carvedilol  Other (See Comments)    LEG SWELLING, DIZZNESS   Darunavir  Itching   Meloxicam  Hives   Acetaminophen  Rash and Other (See Comments)    Pt tolerates percocet and also plain APAP without rash   Flagyl  [Metronidazole ] Itching   Hydralazine  Other (See Comments)    Dizziness and leg swelling    Reglan  [Metoclopramide ]     Tremors   Toradol  [Ketorolac  Tromethamine ] Nausea And Vomiting    itching   Tramadol  Itching, Nausea And Vomiting and Other (See Comments)  [2]  Current Outpatient Medications on File Prior to Visit  Medication Sig Dispense Refill   albuterol  (PROVENTIL ) (2.5 MG/3ML) 0.083% nebulizer solution USE 1 VIAL VIA NEBULIZER EVERY 6 HOURS AS NEEDED FOR WHEEZING OR SHORTNESS OF BREATH 75 mL 3   ALPRAZolam  (XANAX ) 0.5 MG tablet TAKE 1/2 TABLET(0.25 MG) BY MOUTH TWICE DAILY AS NEEDED FOR SLEEP (Patient taking differently: Take 0.25 mg by mouth at bedtime.) 30 tablet 2   azelastine  (ASTELIN ) 0.1 % nasal spray Place 2 sprays into both nostrils in the morning. Use in each nostril as directed     beclomethasone (QVAR  REDIHALER) 40 MCG/ACT inhaler Inhale 2 puffs into the lungs 2 (two) times daily. 1 each 5   bictegravir-emtricitabine -tenofovir  AF (BIKTARVY ) 50-200-25 MG TABS tablet Take 1 tablet by mouth daily. 90 tablet 3   budesonide -glycopyrrolate -formoterol  (BREZTRI  AEROSPHERE) 160-9-4.8 MCG/ACT AERO inhaler INHALE 2 PUFFS INTO THE LUNGS IN THE MORNING AND AT BEDTIME 10.7 g 11   cefdinir (OMNICEF) 300 MG capsule Take 1 capsule (300 mg total) by mouth 2 (two) times daily. 14 capsule 0   cetirizine  (ZYRTEC ) 10 MG tablet TAKE 1 TABLET(10 MG) BY MOUTH DAILY 90 tablet 3   Cholecalciferol  (VITAMIN D3) 250 MCG (10000 UT) capsule Take 1 tablet by mouth  daily. Nature's Made     diltiazem  (CARDIZEM ) 30 MG tablet Take 1 tablet (30 mg total) by mouth 4 (four) times daily as needed (for heart racing/palpitations). 120 tablet 1   diphenhydrAMINE  (BENADRYL ) 25 MG tablet Take 1 tablet (25 mg total) by mouth every 6 (six) hours as needed. 30 tablet 0   dorzolamide  (TRUSOPT ) 2 % ophthalmic solution Place 1 drop into both eyes 2 (two) times daily.     EPINEPHrine  0.3 mg/0.3 mL IJ SOAJ injection INJECT 1 PEN IN THE MUSCLE ONE TIME AS DIRECTED AS NEEDED FOR ANAPHYLAXIS 2 each 3   famotidine  (PEPCID ) 40 MG tablet Take 1 tablet (40 mg total) by mouth at bedtime. 90 tablet 1   gabapentin  (NEURONTIN ) 300 MG capsule Use one in AM and 2 at bedtime for neuralgia 120 capsule 4   Glucos-Chond-Hyal Ac-Ca Fructo (MOVE FREE JOINT HEALTH ADVANCE PO) Take 1 tablet by mouth daily.     HYDROcodone -acetaminophen  (NORCO/VICODIN) 5-325 MG tablet 1-2 tabs PO q6 hours prn pain (  Patient taking differently: Take 1 tablet by mouth daily. 1-2 tabs PO q6 hours prn pain) 20 tablet 0   hydrOXYzine  (ATARAX ) 50 MG tablet Take 1 tablet (50 mg total) by mouth at bedtime.     isosorbide  dinitrate (ISORDIL ) 20 MG tablet Take 1 tablet (20 mg total) by mouth 3 (three) times daily. 270 tablet 1   meclizine  (ANTIVERT ) 25 MG tablet TAKE 1 TABLET(25 MG) BY MOUTH DAILY AS NEEDED 40 tablet 5   methocarbamol  (ROBAXIN ) 500 MG tablet Take 1-2 tablets (500-1,000 mg total) by mouth every 6 (six) hours as needed for muscle spasms. 30 tablet 2   metoprolol  succinate (TOPROL -XL) 100 MG 24 hr tablet Take 1 tablet (100 mg total) by mouth daily. Take with or immediately following a meal. 90 tablet 3   montelukast  (SINGULAIR ) 10 MG tablet Take 10 mg by mouth daily.     MOTEGRITY  2 MG TABS Take 1 tablet (2 mg total) by mouth daily. PHARMACY-PLEASE PROVIDE NAME BRAND MOTEGRITY  AS THIS IS COVERED BY PT INSURANCE. THANK YOU 90 tablet 1   Multiple Vitamin (MULTIVITAMIN WITH MINERALS) TABS tablet Take 1 tablet by mouth  daily after breakfast.      naloxegol  oxalate (MOVANTIK ) 25 MG TABS tablet TAKE 1 TABLET(25 MG) BY MOUTH DAILY 30 tablet 2   ondansetron  (ZOFRAN -ODT) 4 MG disintegrating tablet Take 1 tablet (4 mg total) by mouth every 8 (eight) hours as needed for nausea or vomiting. 30 tablet 2   OVER THE COUNTER MEDICATION Take 2 capsules by mouth daily. Zhou: thyroid  supplement     pantoprazole  (PROTONIX ) 40 MG tablet TAKE 1 TABLET(40 MG) BY MOUTH TWICE DAILY BEFORE A MEAL 180 tablet 3   polyethylene glycol (MIRALAX  / GLYCOLAX ) 17 g packet Take 17 g by mouth daily as needed for mild constipation. 14 each 0   predniSONE  (DELTASONE ) 10 MG tablet Take 2 tabs daily with food x 5ds, then 1 tab daily with food x 5ds then STOP 20 tablet 0   QNASL  80 MCG/ACT AERS USE 2 SPRAYS IN EACH NOSTRIL DAILY 10.6 g 2   rosuvastatin  (CRESTOR ) 20 MG tablet TAKE 1 TABLET(20 MG) BY MOUTH DAILY 90 tablet 3   tizanidine  (ZANAFLEX ) 6 MG capsule TAKE 1 CAPSULE(6 MG) BY MOUTH THREE TIMES DAILY 90 capsule 3   torsemide  (DEMADEX ) 20 MG tablet Take 1 tablet (20 mg total) by mouth 2 (two) times daily. 180 tablet 1   Ubrogepant  (UBRELVY ) 100 MG TABS Take 1 tablet (100 mg total) by mouth as needed (take 1 tablet at onset of migraine. may take an additional tablet 2 hrs later if needed (Do not exceed more than 2 tablets in 24 hour period)). 16 tablet 5   VENTOLIN  HFA 108 (90 Base) MCG/ACT inhaler INHALE 2 PUFFS INTO THE LUNGS EVERY 6 HOURS AS NEEDED FOR WHEEZING OR SHORTNESS OF BREATH 54 g 0   nitroGLYCERIN  (NITROSTAT ) 0.4 MG SL tablet Place 1 tablet (0.4 mg total) under the tongue every 5 (five) minutes as needed for chest pain. 30 tablet 1   [DISCONTINUED] zolpidem  (AMBIEN ) 5 MG tablet Take 1-2 tablets (5-10 mg total) by mouth at bedtime as needed for sleep. (Patient not taking: Reported on 06/27/2024) 30 tablet 2   No current facility-administered medications on file prior to visit.

## 2024-07-10 NOTE — Patient Instructions (Signed)
 Please consider having the Tdap tetanus shot at the pharmacy  Please continue all other medications as before, and refills have been done if requested.  Please have the pharmacy call with any other refills you may need.  Please continue your efforts at being more active, low cholesterol diet, and weight control.  You are otherwise up to date with prevention measures today.  Please keep your appointments with your specialists as you may have planned - Dr Tobie renal soon  We can hold on more labs today  Please make an Appointment to return in 6 months, or sooner if needed

## 2024-07-10 NOTE — Assessment & Plan Note (Signed)
Last vitamin D Lab Results  Component Value Date   VD25OH 27.12 (L) 08/14/2022   Low, to start oral replacement  

## 2024-07-10 NOTE — Assessment & Plan Note (Signed)
 BP Readings from Last 3 Encounters:  07/10/24 136/78  07/04/24 124/80  06/27/24 123/74   Stable, pt to continue medical treatment toprol  xl 100 mg qd

## 2024-07-10 NOTE — Assessment & Plan Note (Signed)
 Stable, cont Inhaler prn

## 2024-07-10 NOTE — Assessment & Plan Note (Signed)
 Age and sex appropriate education and counseling updated with regular exercise and diet Referrals for preventative services - none needed Immunizations addressed - declines hep b and covid booster, for tdap at pharmacy Smoking counseling  - none needed Evidence for depression or other mood disorder - none significant Most recent labs reviewed. I have personally reviewed and have noted: 1) the patient's medical and social history 2) The patient's current medications and supplements 3) The patient's height, weight, and BMI have been recorded in the chart

## 2024-07-10 NOTE — Assessment & Plan Note (Signed)
 Lab Results  Component Value Date   LDLCALC 48 07/09/2024   Stable, pt to continue current statin crestor  20 mg qd

## 2024-07-11 LAB — COMPREHENSIVE METABOLIC PANEL WITH GFR
ALT: 21 IU/L (ref 0–44)
AST: 25 IU/L (ref 0–40)
Albumin: 4.4 g/dL (ref 3.9–4.9)
Alkaline Phosphatase: 120 IU/L (ref 47–123)
BUN/Creatinine Ratio: 9 — ABNORMAL LOW (ref 10–24)
BUN: 13 mg/dL (ref 8–27)
Bilirubin Total: 1.3 mg/dL — ABNORMAL HIGH (ref 0.0–1.2)
CO2: 21 mmol/L (ref 20–29)
Calcium: 9.3 mg/dL (ref 8.6–10.2)
Chloride: 106 mmol/L (ref 96–106)
Creatinine, Ser: 1.41 mg/dL — ABNORMAL HIGH (ref 0.76–1.27)
Globulin, Total: 2.6 g/dL (ref 1.5–4.5)
Glucose: 90 mg/dL (ref 70–99)
Potassium: 4.1 mmol/L (ref 3.5–5.2)
Sodium: 140 mmol/L (ref 134–144)
Total Protein: 7 g/dL (ref 6.0–8.5)
eGFR: 57 mL/min/1.73 — ABNORMAL LOW

## 2024-07-11 LAB — LIPID PANEL
Chol/HDL Ratio: 1.9 ratio (ref 0.0–5.0)
Cholesterol, Total: 133 mg/dL (ref 100–199)
HDL: 69 mg/dL
LDL Chol Calc (NIH): 48 mg/dL (ref 0–99)
Triglycerides: 85 mg/dL (ref 0–149)
VLDL Cholesterol Cal: 16 mg/dL (ref 5–40)

## 2024-07-11 LAB — CBC
Hematocrit: 36.9 % — ABNORMAL LOW (ref 37.5–51.0)
Hemoglobin: 11.3 g/dL — ABNORMAL LOW (ref 13.0–17.7)
MCH: 28.2 pg (ref 26.6–33.0)
MCHC: 30.6 g/dL — ABNORMAL LOW (ref 31.5–35.7)
MCV: 92 fL (ref 79–97)
Platelets: 201 x10E3/uL (ref 150–450)
RBC: 4.01 x10E6/uL — ABNORMAL LOW (ref 4.14–5.80)
RDW: 18.1 % — ABNORMAL HIGH (ref 11.6–15.4)
WBC: 7.8 x10E3/uL (ref 3.4–10.8)

## 2024-07-11 LAB — HIV-1 RNA QUANT-NO REFLEX-BLD
HIV-1 RNA Viral Load Log: 2.342 {Log_copies}/mL
HIV-1 RNA Viral Load: 220 {copies}/mL

## 2024-07-11 LAB — SYPHILIS: RPR W/REFLEX TO RPR TITER AND TREPONEMAL ANTIBODIES, TRADITIONAL SCREENING AND DIAGNOSIS ALGORITHM: RPR Ser Ql: NONREACTIVE

## 2024-07-18 ENCOUNTER — Ambulatory Visit: Payer: Self-pay | Admitting: Internal Medicine

## 2024-07-22 ENCOUNTER — Encounter: Admitting: Infectious Diseases

## 2024-07-23 ENCOUNTER — Ambulatory Visit

## 2024-07-23 ENCOUNTER — Ambulatory Visit: Admitting: Infectious Diseases

## 2024-07-23 ENCOUNTER — Encounter: Admitting: Infectious Diseases

## 2024-07-23 VITALS — BP 163/93 | HR 89 | Temp 97.8°F | Ht 69.0 in | Wt 244.2 lb

## 2024-07-23 DIAGNOSIS — N179 Acute kidney failure, unspecified: Secondary | ICD-10-CM

## 2024-07-23 DIAGNOSIS — I110 Hypertensive heart disease with heart failure: Secondary | ICD-10-CM

## 2024-07-23 DIAGNOSIS — Z21 Asymptomatic human immunodeficiency virus [HIV] infection status: Secondary | ICD-10-CM

## 2024-07-23 DIAGNOSIS — B20 Human immunodeficiency virus [HIV] disease: Secondary | ICD-10-CM | POA: Diagnosis not present

## 2024-07-23 DIAGNOSIS — I5030 Unspecified diastolic (congestive) heart failure: Secondary | ICD-10-CM | POA: Diagnosis not present

## 2024-07-23 DIAGNOSIS — Z23 Encounter for immunization: Secondary | ICD-10-CM

## 2024-07-23 DIAGNOSIS — I10 Essential (primary) hypertension: Secondary | ICD-10-CM

## 2024-07-23 DIAGNOSIS — J441 Chronic obstructive pulmonary disease with (acute) exacerbation: Secondary | ICD-10-CM

## 2024-07-23 DIAGNOSIS — Z113 Encounter for screening for infections with a predominantly sexual mode of transmission: Secondary | ICD-10-CM

## 2024-07-23 NOTE — Addendum Note (Signed)
 Addended by: Anival Pasha on: 07/23/2024 04:36 PM   Modules accepted: Orders

## 2024-07-23 NOTE — Assessment & Plan Note (Signed)
 His BP was normal at home They attribute his reading to clinic to being walked around the office and pushing his sister's WC.  Appreciate Dr Anthony f/u.

## 2024-07-23 NOTE — Assessment & Plan Note (Signed)
 He is doing well on biktarvy , off cabaneuva.  Will continue this, may be contributing to his slight rise in Cr.  Has gotten flu vax  Needs COVID PCV 20 at 2027 Tdap today Rtc in 9 months.

## 2024-07-23 NOTE — Assessment & Plan Note (Signed)
 Appreciate Dr Anthony f/u.  His Cr has improved slightly, GFR has decreased slightly.

## 2024-07-23 NOTE — Assessment & Plan Note (Signed)
 Appreciate Dr Cyndi f/u  Hopefully off steroids soon which may be contributing to his edema.

## 2024-07-23 NOTE — Progress Notes (Signed)
 Pt was given Flu vaccine with no complications.

## 2024-07-23 NOTE — Progress Notes (Signed)
 "  Subjective:    Patient ID: Casey Brennan, male  DOB: 03/14/1961, 64 y.o.        MRN: [id number]   HPI 64 yo M with hx HIV+ (2012), COPD Gold,  and prostate Ca.  Had radical prostatectomy 08-18-13, adenoCA, LN (-), margins (-).  Had R TKR 08-2016   Was changed to genvoya , prezista  (05-2016). He developed a rash from the sulfa in the darunavir , changed back to his prev rx.  He was previously changed to biktarvy  from [facility] .  Changed to cabaneuva Jan 2025. He was changed back to Biktarvy  05-2024.  Liking better than the cabaneuva Has had mild decrease in his wt.    09-29-20: ANTERIOR CERVICAL DECOMPRESSION FUSION CERVICAL 3-CERVICAL 4 , CERVICAL 4 - CERVICAL 5, CERVICAL 5 - CERVICAL 6 WITH INSTRUMENTATION AND ALLOGRAFT 11-30-22 1.  Posterior spinal fusion C4-C5, C5-C6, C6-7, C7-T1 2.  Placement of posterior segmental instrumentation C4-T1 3.  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    Has seen by ophtho- concern for cataracts, glaucoma. Going to [facility].    He had some concerning findings on his previous abd imaging- most recent shows renal cyst/hemorrhagic. R adrenal nodule.  Colon due 2031. Takes occas dulcolax. Has been followed by Dr Albertus at [facility] for gastroparesis. Takes motegrity , movantic. Protonix , pepcid . Also taking norco 5-325 daily prn. Next f/u not set.   COPD Gold 3- Albuterol ,  breztri , qvar , singulair , zyrtec . Sees Dr Jude.  He was recently given a steroid taper for a flare, wheezing. Worse at night. Was also given a rx for cefdinir.  Has noted swelling in his feet and ankles. Sister says he was previously told he had HFpEF.  He had TTE 10-29-23 EF 60-65%, mild LVH. He had normal cardiac CT last summer.    HIV 1 RNA Quant  Date Value  06/29/2021 597 Copies/mL (H)  11/11/2020 NOT DETECTED copies/mL  03/11/2020 <20 Copies/mL   HIV-1 RNA Viral Load (copies/mL)  Date Value  07/09/2024 220  04/09/2024 50   06/11/2023 70   CD4 T Cell Abs (/uL)  Date Value  07/09/2024 412  04/09/2024 333 (L)  06/11/2023 378 (L)     Health Maintenance  Topic Date Due   DTaP/Tdap/Td (2 - Td or Tdap) 06/11/2021   Hepatitis B Vaccines 19-59 Average Risk (1 of 3 - Risk 3-dose series) Never done   COVID-19 Vaccine (6 - 2025-26 season) 03/03/2024   Lung Cancer Screening  12/24/2024   Medicare Annual Wellness (AWV)  04/04/2025   Pneumococcal Vaccine: 50+ Years (4 of 4 - PCV20 or PCV21) 11/15/2025   Colonoscopy  01/17/2030   Influenza Vaccine  Completed   HPV VACCINES (No Doses Required) Completed   Hepatitis C Screening  Completed   HIV Screening  Completed   Zoster Vaccines- Shingrix   Completed   Meningococcal B Vaccine  Aged Out   Mammogram  Discontinued      Review of Systems  Constitutional:  Positive for malaise/fatigue. Negative for chills, fever and weight loss.  Respiratory:  Negative for cough and shortness of breath.   Cardiovascular:  Positive for leg swelling.  Gastrointestinal:  Positive for constipation (continues to take  dulcolax.). Negative for abdominal pain and diarrhea.  Genitourinary:  Negative for dysuria.    Please see HPI. All other systems reviewed and negative.     Objective:  Physical Exam Vitals reviewed.  Constitutional:      General: He is  not in acute distress.    Appearance: He is obese. He is not toxic-appearing.  HENT:     Mouth/Throat:     Mouth: Mucous membranes are moist.     Pharynx: No oropharyngeal exudate.  Eyes:     Extraocular Movements: Extraocular movements intact.     Pupils: Pupils are equal, round, and reactive to light.  Neurological:     Mental Status: He is alert.            Assessment & Plan:   "

## 2024-07-23 NOTE — Assessment & Plan Note (Signed)
 Appreciate f/u from [facility]

## 2024-07-29 ENCOUNTER — Ambulatory Visit (HOSPITAL_COMMUNITY)
Admission: RE | Admit: 2024-07-29 | Discharge: 2024-07-29 | Disposition: A | Discharge status: Home / Self Care | Source: Ambulatory Visit | Attending: Cardiology | Admitting: Cardiology

## 2024-07-29 DIAGNOSIS — I5030 Unspecified diastolic (congestive) heart failure: Secondary | ICD-10-CM | POA: Insufficient documentation

## 2024-07-29 LAB — ECHOCARDIOGRAM COMPLETE
AR max vel: 3.39 cm2
AV Area VTI: 3.19 cm2
AV Area mean vel: 3.04 cm2
AV Mean grad: 5 mmHg
AV Peak grad: 8.4 mmHg
Ao pk vel: 1.45 m/s
Area-P 1/2: 4.21 cm2
S' Lateral: 2.32 cm

## 2024-07-30 ENCOUNTER — Ambulatory Visit: Payer: Self-pay | Admitting: Cardiology

## 2024-08-05 ENCOUNTER — Other Ambulatory Visit: Payer: Self-pay | Admitting: Neurology

## 2024-08-05 ENCOUNTER — Other Ambulatory Visit: Payer: Self-pay | Admitting: Cardiology

## 2024-08-05 ENCOUNTER — Other Ambulatory Visit: Payer: Self-pay | Admitting: Internal Medicine

## 2024-08-07 ENCOUNTER — Other Ambulatory Visit: Payer: Self-pay

## 2024-08-07 DIAGNOSIS — G4700 Insomnia, unspecified: Secondary | ICD-10-CM

## 2024-08-18 ENCOUNTER — Ambulatory Visit: Admitting: Cardiology

## 2025-04-07 ENCOUNTER — Ambulatory Visit
